# Patient Record
Sex: Male | Born: 1960
Health system: Southern US, Community
[De-identification: ages and names within clinical notes are randomized; demographics above are authoritative.]

## PROBLEM LIST (undated history)

## (undated) DIAGNOSIS — M1990 Unspecified osteoarthritis, unspecified site: Secondary | ICD-10-CM

## (undated) DIAGNOSIS — D649 Anemia, unspecified: Secondary | ICD-10-CM

## (undated) DIAGNOSIS — N200 Calculus of kidney: Secondary | ICD-10-CM

## (undated) DIAGNOSIS — I639 Cerebral infarction, unspecified: Secondary | ICD-10-CM

## (undated) DIAGNOSIS — E785 Hyperlipidemia, unspecified: Secondary | ICD-10-CM

## (undated) DIAGNOSIS — E119 Type 2 diabetes mellitus without complications: Secondary | ICD-10-CM

## (undated) DIAGNOSIS — I10 Essential (primary) hypertension: Secondary | ICD-10-CM

## (undated) HISTORY — PX: ANKLE SURGERY: SHX546

## (undated) HISTORY — PX: KIDNEY STONE SURGERY: SHX686

## (undated) HISTORY — DX: Type 2 diabetes mellitus without complications: E11.9

## (undated) HISTORY — PX: HERNIA REPAIR: SHX51

## (undated) HISTORY — DX: Hyperlipidemia, unspecified: E78.5

---

## 2001-09-06 ENCOUNTER — Ambulatory Visit (HOSPITAL_COMMUNITY): Admission: RE | Admit: 2001-09-06 | Discharge: 2001-09-06 | Payer: Self-pay | Admitting: Pulmonary Disease

## 2002-03-13 ENCOUNTER — Ambulatory Visit (HOSPITAL_COMMUNITY): Admission: RE | Admit: 2002-03-13 | Discharge: 2002-03-13 | Payer: Self-pay | Admitting: General Surgery

## 2002-03-17 ENCOUNTER — Ambulatory Visit (HOSPITAL_COMMUNITY): Admission: RE | Admit: 2002-03-17 | Discharge: 2002-03-17 | Payer: Self-pay | Admitting: General Surgery

## 2003-04-27 ENCOUNTER — Ambulatory Visit (HOSPITAL_COMMUNITY): Admission: RE | Admit: 2003-04-27 | Discharge: 2003-04-27 | Payer: Self-pay | Admitting: General Surgery

## 2004-02-11 ENCOUNTER — Ambulatory Visit (HOSPITAL_COMMUNITY): Admission: RE | Admit: 2004-02-11 | Discharge: 2004-02-11 | Payer: Self-pay | Admitting: Family Medicine

## 2004-10-10 ENCOUNTER — Emergency Department (HOSPITAL_COMMUNITY): Admission: EM | Admit: 2004-10-10 | Discharge: 2004-10-10 | Payer: Self-pay | Admitting: Emergency Medicine

## 2005-04-13 ENCOUNTER — Ambulatory Visit (HOSPITAL_COMMUNITY): Admission: RE | Admit: 2005-04-13 | Discharge: 2005-04-13 | Payer: Self-pay | Admitting: Pulmonary Disease

## 2009-04-03 ENCOUNTER — Encounter (HOSPITAL_COMMUNITY): Admission: RE | Admit: 2009-04-03 | Discharge: 2009-05-03 | Payer: Self-pay | Admitting: Family Medicine

## 2009-04-03 ENCOUNTER — Encounter: Admission: RE | Admit: 2009-04-03 | Discharge: 2009-04-03 | Payer: Self-pay | Admitting: Family Medicine

## 2009-04-04 ENCOUNTER — Encounter: Admission: RE | Admit: 2009-04-04 | Discharge: 2009-04-04 | Payer: Self-pay | Admitting: Family Medicine

## 2010-05-23 NOTE — H&P (Signed)
NAME:  DELRICO, MINEHART NO.:  [id number]   MEDICAL RECORD NO.:  [id number]                  PATIENT TYPE:   LOCATION:                                       FACILITY:   PHYSICIAN:  Dalia Heading, M.D.               DATE OF BIRTH:   DATE OF ADMISSION:  DATE OF DISCHARGE:                                HISTORY & PHYSICAL   CHIEF COMPLAINT:  Umbilical hernia.   HISTORY OF PRESENT ILLNESS:  The patient is a 50 year old white male who is  referred for evaluation and treatment of an umbilical hernia.  He underwent  ventral herniorrhaphy in 2004.  This was superior to the umbilicus.  He now  presents with a week-long history of umbilical swelling.  It is made worse  with straining.  No nausea or vomiting had been noted.   PAST MEDICAL HISTORY:  Unremarkable.   PAST SURGICAL HISTORY:  As noted above.  Ankle surgery.   CURRENT MEDICATIONS:  None.   ALLERGIES:  PENICILLIN.   REVIEW OF SYSTEMS:  Unremarkable.  The patient denies any cardiopulmonary  difficulties or bleeding disorders.   PHYSICAL EXAMINATION:  GENERAL:  The patient is a moderately-obese white  male, in no acute distress.  VITAL SIGNS:  He is afebrile.  Vital signs are stable.  LUNGS:  Clear to auscultation with equal breath sounds bilaterally.  HEART:  Examination reveals a regular rate and rhythm without S3, S4 or  murmurs.  ABDOMEN: Soft, nontender, nondistended.  No hepatosplenomegaly or masses are  noted.  Reducible umbilical hernia is present.  The previous supraumbilical  herniorrhaphy is intact.   IMPRESSION:  Umbilical hernia.   PLAN:  The patient is scheduled for an umbilical herniorrhaphy on April 27, 2003.  The risks and benefits of the procedure, including bleeding,  infection, the possibility of recurrence of the hernia are fully explained  to the patient who gave informed consent.     ___________________________________________    Dalia Heading, M.D.   MAJ/MEDQ  D:  03/27/2003  T:  03/27/2003  Job:  [id number]   cc:   Ramon Dredge L. Juanetta Gosling, M.D.  [address]  [place]  [place] [address]  Fax: [phone]

## 2010-05-23 NOTE — Group Therapy Note (Signed)
NAMESELIG, Troy Randall               ACCOUNT NO.:  [id number]   MEDICAL RECORD NO.:  [id number]          PATIENT TYPE:  EMS   LOCATION:  ED                            FACILITY:  [facility]   PHYSICIAN:  Kingsley Callander. Ouida Sills, MD       DATE OF BIRTH:  [birth year]   DATE OF PROCEDURE:  10/10/2004  DATE OF DISCHARGE:                                   PROGRESS NOTE   CHIEF COMPLAINT:  Chest pain.   HISTORY OF PRESENT ILLNESS:  This patient is a 50 year old white male who  presented to the emergency room after experiencing recurrent bouts of chest  pain this morning.  He had been driving a truck at the time and felt  substernal pain intermittently 7-8 times lasting approximately 30 seconds  each time.  He also felt pressure out in his lateral chest areas.  He went  to a paramedic base and was found to be mildly hypertensive.  He was  encouraged to have additional evaluation and presented to the emergency room  where EKG was normal and point of care markers were negative.  It turns out  he has previously had a negative nuclear stress test performed by Dr.  Alanda Amass earlier this year.  He does not have diabetes or a known history  of hyperlipidemia.  His father had a stroke, but no family members have had  heart disease.   PAST MEDICAL HISTORY:  1.  Two umbilical hernia repairs.  2.  Right ankle surgery.  3.  Three kidney stones with basket extraction.   MEDICATIONS:  None.   ALLERGIES:  PENICILLIN.   SOCIAL HISTORY:  He does not smoke.   REVIEW OF SYSTEMS:  Negative.   PHYSICAL EXAMINATION:  VITAL SIGNS:  Pulse 80, blood pressure 134/80, oxygen  saturation 99%, temperature 97.1, respirations 22.  GENERAL APPEARANCE:  Alert, comfortable, obese white male.  HEENT:  Unremarkable.  LUNGS:  Clear.  HEART:  Regular with no murmurs or gallops.  CHEST:  Nontender.  ABDOMEN:  Nontender.  No hepatosplenomegaly.  EXTREMITIES:  No calf tenderness.  Normal pedal pulses.  No edema, cyanosis  or  clubbing.   STUDIES:  Chest x-ray reveals borderline cardiomegaly but no acute  infiltrate.   LABORATORY DATA:  White count 6.8, hemoglobin 13.8, platelets 236,000.  Sodium 138, potassium 3.9, bicarbonate 27, glucose 93, creatinine 0.8,  calcium 9.0, SGOT 21, troponin I less than 0.05.  EKG reveals normal sinus  rhythm with early transition.   IMPRESSION:  Chest pain.  Observation in a monitored setting was advised.  The patient declined and states he is going home.  He was therefore  encouraged to follow up with Dr. Juanetta Gosling on Monday.  If he has recurrent  pain, he will return promptly.  He knows of the potential risk of not  following recommendations could be myocardial infarction or death.      Kingsley Callander. Ouida Sills, MD  Electronically Signed     ROF/MEDQ  D:  10/10/2004  T:  10/10/2004  Job:  [id number]   cc:   Ramon Dredge L. Juanetta Gosling, M.D.  Fax: [phone]

## 2010-05-23 NOTE — Op Note (Signed)
   NAME:  Troy Randall, Troy Randall                         ACCOUNT NO.:  [id number]   MEDICAL RECORD NO.:  [id number]                   PATIENT TYPE:  AMB   LOCATION:  DAY                                  FACILITY:  [facility]   PHYSICIAN:  Dalia Heading, M.D.               DATE OF BIRTH:  1960-01-11   DATE OF PROCEDURE:  03/17/2002  DATE OF DISCHARGE:                                 OPERATIVE REPORT   PREOPERATIVE DIAGNOSIS:  Ventral hernia.   POSTOPERATIVE DIAGNOSIS:  Ventral hernia.   PROCEDURE:  Ventral herniorrhaphy with mesh.   SURGEON:  Dalia Heading, M.D.   ANESTHESIA:  General endotracheal.   INDICATIONS FOR PROCEDURE:  The patient is a 50 year old moderately obese  white male who presents with a supraumbilical ventral hernia.  The risks and  benefits of the procedure, including bleeding, infection, and recurrence of  the hernia were fully explained to the patient who gave informed consent.   DESCRIPTION OF PROCEDURE:  The patient was placed in the supine position.  After induction of general endotracheal anesthesia, the abdomen was prepped  and draped using the usual sterile technique with Betadine.  Surgical site  confirmation was performed.   A supraumbilical midline incision was taken down to the fascia.  The hernia  defect was noted to be about 3 cm in its greatest diameter.  The hernia sac  was freed away from the surrounding fascia, and the contents were reduced.  The fascia was then closed transversely using 0 Surgidac interrupted  sutures.  An onlay mesh patch was then placed over the repair and secured to  the fascia using 2-0 Novofil interrupted sutures.  The subcutaneous layer  was reapproximated using a 2-0 Vicryl interrupted suture.  The subcutaneous  layer was reapproximated also with 3-0 Vicryl interrupted suture.  The skin  was closed using staples.  Sensorcaine 0.5% was instilled into the  surrounding wound.  Staples and Betadine ointment were then applied.   All tape and needle counts were correct at the end of the procedure.  The  patient was extubated in the operating room and went back to the recovery  room awake and in stable condition.   COMPLICATIONS:  None.    SPECIMENS:  None.   ESTIMATED BLOOD LOSS:  Minimal.                                               Dalia Heading, M.D.    MAJ/MEDQ  D:  03/17/2002  T:  03/17/2002  Job:  [id number]   cc:   Ramon Dredge L. Juanetta Gosling, M.D.  [address]  [place]  [place] [address]  Fax: [phone]

## 2010-05-23 NOTE — H&P (Signed)
   Troy Randall, Troy Randall                           ACCOUNT NO.:  [id number]   MEDICAL RECORD NO.:  [id number]                   PATIENT TYPE:  AMB   LOCATION:                                       FACILITY:  [facility]   PHYSICIAN:  Dalia Heading, M.D.               DATE OF BIRTH:  04-01-1960   DATE OF ADMISSION:  03/13/2002  DATE OF DISCHARGE:                                HISTORY & PHYSICAL   CHIEF COMPLAINT:  Ventral hernia.   HISTORY OF PRESENT ILLNESS:  The patient is a 50 year old white male who was  referred for evaluation and treatment of abdominal pain and swelling.  It  started last week.  It has worsened with time.  The swelling occurs just  above the umbilicus.  It is made worse with straining.  No nausea or  vomiting have been noted.   PAST MEDICAL HISTORY:  Unremarkable.   PAST SURGICAL HISTORY:  Ankle surgery.   CURRENT MEDICATIONS:  None.   ALLERGIES:  PENICILLIN.   REVIEW OF SYSTEMS:  Unremarkable.  The patient denies any cardiopulmonary  difficulties or bleeding disorders.   PHYSICAL EXAMINATION:  GENERAL:  The patient is a moderately obese white  male in no acute distress.  VITAL SIGNS:  He is afebrile and his vital signs are stable.  LUNGS:  Clear to auscultation with equal breath sounds bilaterally.  HEART:  Regular rate and rhythm without S3, S4, or murmurs.  ABDOMEN:  Soft and nondistended.  He has a tender mass which reduces in the  supraumbilical region.  No hepatosplenomegaly is noted.   IMPRESSION:  Ventral hernia.    PLAN:  The patient is scheduled for ventral herniorrhaphy on 03/13/2002.  The  risks and benefits of the procedure, including bleeding, infection, and the  possibility of recurrence of the hernia were fully explained to the patient  who gave informed consent.                                               Dalia Heading, M.D.    MAJ/MEDQ  D:  03/09/2002  T:  03/09/2002  Job:  [id number]   cc:   Ramon Dredge L. Juanetta Gosling, M.D.  [address]  [place]  [place] [address]  Fax: [phone]

## 2010-05-23 NOTE — Op Note (Signed)
NAME:  Troy Randall, Troy Randall                         ACCOUNT NO.:  [id number]   MEDICAL RECORD NO.:  [id number]                   PATIENT TYPE:  AMB   LOCATION:  DAY                                  FACILITY:  [facility]   PHYSICIAN:  Dalia Heading, M.D.               DATE OF BIRTH:  May 16, 1960   DATE OF PROCEDURE:  04/27/2003  DATE OF DISCHARGE:                                 OPERATIVE REPORT   PREOPERATIVE DIAGNOSIS:  Umbilical hernia.   POSTOPERATIVE DIAGNOSIS:  Umbilical hernia.   PROCEDURE:  Umbilical herniorrhaphy.   SURGEON:  Dalia Heading, M.D.   ANESTHESIA:  General endotracheal.   INDICATIONS:  The patient is a 50 year old, obese, white male who presents  with an umbilical hernia.  The risks and benefits of the procedure including  bleeding, infection, and recurrence of the hernia were fully explained to  the patient, who gave informed consent.   DESCRIPTION OF PROCEDURE:  The patient was placed in the supine position.  After induction of general endotracheal anesthesia, the abdomen was prepped  and draped using the usual sterile technique with Betadine.  Surgical site  confirmation was performed.   An infraumbilical incision was made down to the fascia.  The umbilicus was  freed away from the underlying fascia.  The base of the umbilicus was noted  to be wide based.  A small 0.5 cm fascial defect was noted.  The hernia sac  was excised.  The defect was then closed transversely using #0 Ethibond  interrupted sutures.  The base of the umbilicus was then secured back to the  fascia using a 2-0 Vicryl interrupted suture.   The subcutaneous layer was closed using a 3-0 Vicryl interrupted suture.  The skin was closed using staples.  Sensorcaine 0.05% was instilled into the  surrounding wound.  Betadine ointment and dry sterile dressing were applied.   All tape and needle counts were correct at the end of the procedure.  The  patient was extubated in the operating room and went  back to recovery room  awake, in stable condition.   COMPLICATIONS:  None.   SPECIMENS:  None.   BLOOD LOSS:  Minimal.      ___________________________________________                                            Dalia Heading, M.D.   MAJ/MEDQ  D:  04/27/2003  T:  04/28/2003  Job:  [id number]   cc:   Dalia Heading, M.D.  [address] [place]  [place] [address]  Fax: [phone]   Oneal Deputy. Juanetta Gosling, M.D.  [address]  [place]  [place] [address]  Fax: [phone]

## 2011-03-24 NOTE — H&P (Signed)
  NTS SOAP Note  Vital Signs:  Vitals as of: 03/24/2011: Systolic 146: Diastolic 97: Heart Rate 92: Temp 98.50F: Height 75ft 0in: Weight 302Lbs 0 Ounces: OFC 0in: Respiratory Rate 0: O2 Saturation 0: Pain Level 0: BMI 41  BMI : 40.96 kg/m2  Subjective: This 51 Years 67 Months old Male presents for of  HERNIA: ,States that he has had increased swelling and pain beneath his superumbilical hernia scar over the past month.  Made worse with straining.  No nausea, vomiting.  Review of Symptoms:  Constitutional:unremarkable Head:unremarkable Eyes:pain bilateral Nose/Mouth/Throat:unremarkable Cardiovascular:unremarkable Respiratory:unremarkable Gastrointestinabdominal pain Genitourinary:unremarkable joint and back pain Skin:unremarkable Hematolgic/Lymphatic:unremarkable Allergic/Immunologic:unremarkable   Past Medical History:Reviewed   Past Medical History  Surgical History: umbilical herniorrhaphy x 2, last one 9 years ago Medical Problems:  High Blood pressure Allergies: pcn Medications: a bp pill   Social History:Reviewed   Social History  Preferred Language: English ([place]) Race:  White Ethnicity: Not Hispanic / Latino Age: 51 Years 6 Months Marital Status:  M Alcohol: unknown Recreational drug(s):  No   Smoking Status: Never smoker reviewed on 03/24/2011  Family History:Reviewed   Family History  Is there a family history WG:[id number]    Objective Information: Obese wm, in NAD Head:Atraumatic; no masses; no abnormalities Heart:RRR, no murmur or gallop.  Normal S1, S2.  No S3, S4.  Lungs:CTA bilaterally, no wheezes, rhonchi, rales.  Breathing unlabored. Abdomen:Soft, NT/ND, normal bowel sounds, no HSM, no masses.  No peritoneal signs.Reducible supraumbilical hernia present, >5cm in size.  Made worse with straining.  Assessment:Recurrent incisional hernia  Diagnosis &amp; Procedure:  DiagnosisCode: 553.21, ProcedureCode: 46962,    Plan:Scheduled for laparoscopic recurrent incisional herniorrhaphy with mesh on 04/13/11.   Patient Education:Alternative treatments to surgery were discussed with patient (and family).Risks and benefits  of procedure were fully explained to the patient (and family) who gave informed consent. Patient/family questions were addressed.  Follow-up:Pending Surgery

## 2011-03-30 ENCOUNTER — Encounter (HOSPITAL_COMMUNITY): Payer: Self-pay | Admitting: Pharmacy Technician

## 2011-04-06 NOTE — Patient Instructions (Addendum)
20 DERRIOUS BOLOGNA  04/06/2011   Your procedure is scheduled on:  04/13/2011  Report to [facility] at  615  AM.  Call this number if you have problems the morning of surgery: [phone]   Remember:   Do not eat food:After Midnight.  May have clear liquids:until Midnight .  Clear liquids include soda, tea, black coffee, apple or grape juice, broth.  Take these medicines the morning of surgery with A SIP OF WATER: lotrel   Do not wear jewelry, make-up or nail polish.  Do not wear lotions, powders, or perfumes. You may wear deodorant.  Do not shave 48 hours prior to surgery.  Do not bring valuables to the hospital.  Contacts, dentures or bridgework may not be worn into surgery.  Leave suitcase in the car. After surgery it may be brought to your room.  For patients admitted to the hospital, checkout time is 11:00 AM the day of discharge.   Patients discharged the day of surgery will not be allowed to drive home.  Name and phone number of your driver: family  Special Instructions: CHG Shower Use Special Wash: 1/2 bottle night before surgery and 1/2 bottle morning of surgery.   Please read over the following fact sheets that you were given: Pain Booklet, MRSA Information, Surgical Site Infection Prevention, Anesthesia Post-op Instructions and Care and Recovery After Surgery Hernia, Surgical Repair A hernia occurs when an internal organ pushes out through a weak spot in the belly (abdominal) wall muscles. Hernias commonly occur in the groin and around the navel. Hernias often can be pushed back into place (reduced). Most hernias tend to get worse over time. Problems occur when abdominal contents get stuck in the opening (incarcerated hernia). The blood supply gets cut off (strangulated hernia). This is an emergency and needs surgery. Otherwise, hernia repair can be an elective procedure. This means you can schedule this at your convenience when an emergency is not present. Because complications can  occur, if you decide to repair the hernia, it is best to do it soon. When it becomes an emergency procedure, there is increased risk of complications after surgery. CAUSES   Heavy lifting.   Obesity.   Prolonged coughing.   Straining to move your bowels.   Hernias can also occur through a cut (incision) by a surgeonafter an abdominal operation.  HOME CARE INSTRUCTIONS Before the repair:  Bed rest is not required. You may continue your normal activities, but avoid heavy lifting (more than 10 pounds) or straining. Cough gently. If you are a smoker, it is best to stop. Even the best hernia repair can break down with the continual strain of coughing.   Do not wear anything tight over your hernia. Do not try to keep it in with an outside bandage or truss. These can damage abdominal contents if they are trapped in the hernia sac.   Eat a normal diet. Avoid constipation. Straining over long periods of time to have a bowel movement will increase hernia size. It also can breakdown repairs. If you cannot do this with diet alone, laxatives or stool softeners may be used.  PRIOR TO SURGERY, SEEK IMMEDIATE MEDICAL CARE IF: You have problems (symptoms) of a trapped (incarcerated) hernia. Symptoms include:  An oral temperature above 102 F (38.9 C) develops, or as your caregiver suggests.   Increasing abdominal pain.   Feeling sick to your stomach(nausea) and vomiting.   You stop passing gas or stool.   The hernia is stuck  outside the abdomen, looks discolored, feels hard, or is tender.   You have any changes in your bowel habits or in the hernia that is unusual for you.  LET YOUR CAREGIVERS KNOW ABOUT THE FOLLOWING:  Allergies.   Medications taken including herbs, eye drops, over the counter medications, and creams.   Use of steroids (by mouth or creams).   Family or personal history of problems with anesthetics or Novocaine.   Possibility of pregnancy, if this applies.   Personal  history of blood clots (thrombophlebitis).   Family or personal history of bleeding or blood problems.   Previous surgery.   Other health problems.  BEFORE THE PROCEDURE You should be present 1 hour prior to your procedure, or as directed by your caregiver.  AFTER THE PROCEDURE After surgery, you will be taken to the recovery area. A nurse will watch and check your progress there. Once you are awake, stable, and taking fluids well, you will be allowed to go home as long as there are no problems. Once home, an ice pack (wrapped in a light towel) applied to your operative site may help with discomfort. It may also keep the swelling down. Do not lift anything heavier than 10 pounds (4.55 kilograms). Take showers not baths. Do not drive while taking narcotics. Follow instructions as suggested by your caregiver.  SEEK IMMEDIATE MEDICAL CARE IF: After surgery:  There is redness, swelling, or increasing pain in the wound.   There is pus coming from the wound.   There is drainage from a wound lasting longer than 1 day.   An unexplained oral temperature above 102 F (38.9 C) develops.   You notice a foul smell coming from the wound or dressing.   There is a breaking open of a wound (edged not staying together) after the sutures have been removed.   You notice increasing pain in the shoulders (shoulder strap areas).   You develop dizzy episodes or fainting while standing.   You develop persistent nausea or vomiting.   You develop a rash.   You have difficulty breathing.   You develop any reaction or side effects to medications given.  MAKE SURE YOU:   Understand these instructions.   Will watch your condition.   Will get help right away if you are not doing well or get worse.  Document Released: 06/17/2000 Document Revised: 12/11/2010 Document Reviewed: 05/10/2007 [facility] Patient Information 2012 [place], [place].PATIENT INSTRUCTIONS POST-ANESTHESIA  IMMEDIATELY FOLLOWING  SURGERY:  Do not drive or operate machinery for the first twenty four hours after surgery.  Do not make any important decisions for twenty four hours after surgery or while taking narcotic pain medications or sedatives.  If you develop intractable nausea and vomiting or a severe headache please notify your doctor immediately.  FOLLOW-UP:  Please make an appointment with your surgeon as instructed. You do not need to follow up with anesthesia unless specifically instructed to do so.  WOUND CARE INSTRUCTIONS (if applicable):  Keep a dry clean dressing on the anesthesia/puncture wound site if there is drainage.  Once the wound has quit draining you may leave it open to air.  Generally you should leave the bandage intact for twenty four hours unless there is drainage.  If the epidural site drains for more than 36-48 hours please call the anesthesia department.  QUESTIONS?:  Please feel free to call your physician or the hospital operator if you have any questions, and they will be happy to assist you.  [facility] Anesthesia Department [address] [place] [phone]

## 2011-04-07 ENCOUNTER — Other Ambulatory Visit: Payer: Self-pay

## 2011-04-07 ENCOUNTER — Encounter (HOSPITAL_COMMUNITY)
Admission: RE | Admit: 2011-04-07 | Discharge: 2011-04-07 | Disposition: A | Discharge status: Home / Self Care | Payer: BC Managed Care – PPO | Source: Ambulatory Visit | Attending: General Surgery | Admitting: General Surgery

## 2011-04-07 ENCOUNTER — Encounter (HOSPITAL_COMMUNITY): Payer: Self-pay

## 2011-04-07 HISTORY — DX: Essential (primary) hypertension: I10

## 2011-04-07 HISTORY — DX: Unspecified osteoarthritis, unspecified site: M19.90

## 2011-04-07 LAB — CBC
HCT: 42.4 % (ref 39.0–52.0)
Hemoglobin: 14.9 g/dL (ref 13.0–17.0)
MCH: 29.2 pg (ref 26.0–34.0)
MCV: 83.1 fL (ref 78.0–100.0)
Platelets: 173 10*3/uL (ref 150–400)
RBC: 5.1 MIL/uL (ref 4.22–5.81)
RDW: 12.5 % (ref 11.5–15.5)
WBC: 5.6 10*3/uL (ref 4.0–10.5)

## 2011-04-07 LAB — DIFFERENTIAL
Basophils Absolute: 0 10*3/uL (ref 0.0–0.1)
Eosinophils Absolute: 0.2 10*3/uL (ref 0.0–0.7)
Eosinophils Relative: 4 % (ref 0–5)
Lymphocytes Relative: 22 % (ref 12–46)
Lymphs Abs: 1.2 10*3/uL (ref 0.7–4.0)
Monocytes Absolute: 0.3 10*3/uL (ref 0.1–1.0)
Monocytes Relative: 6 % (ref 3–12)
Neutrophils Relative %: 68 % (ref 43–77)

## 2011-04-07 LAB — BASIC METABOLIC PANEL
CO2: 27 mEq/L (ref 19–32)
Chloride: 97 mEq/L (ref 96–112)
Creatinine, Ser: 0.6 mg/dL (ref 0.50–1.35)
GFR calc Af Amer: >90 mL/min (ref >90)
GFR calc non Af Amer: >90 mL/min (ref >90)
Glucose, Bld: 250 mg/dL — ABNORMAL HIGH (ref 70–99)
Potassium: 4.5 mEq/L (ref 3.5–5.1)

## 2011-04-07 LAB — SURGICAL PCR SCREEN
MRSA, PCR: NEGATIVE
Staphylococcus aureus: NEGATIVE

## 2011-04-07 NOTE — Progress Notes (Signed)
04/07/11 0917  OBSTRUCTIVE SLEEP APNEA  Have you ever been diagnosed with sleep apnea through a sleep study? No  Do you snore loudly (loud enough to be heard through closed doors)?  1  Do you often feel tired, fatigued, or sleepy during the daytime? 0  Has anyone observed you stop breathing during your sleep? 0  Do you have, or are you being treated for high blood pressure? 1  BMI more than 35 kg/m2? 1  Age over 49 years old? 0  Neck circumference greater than 40 cm/18 inches? 0  Gender: 1  Obstructive Sleep Apnea Score 4   Score 4 or greater  Updated health history

## 2011-04-08 NOTE — Pre-Procedure Instructions (Signed)
Glucose 250 shown to Dr Jayme Cloud. Will do accucheck on arrival.

## 2011-04-13 ENCOUNTER — Encounter (HOSPITAL_COMMUNITY): Payer: Self-pay | Admitting: *Deleted

## 2011-04-13 ENCOUNTER — Encounter (HOSPITAL_COMMUNITY): Payer: Self-pay | Admitting: Anesthesiology

## 2011-04-13 ENCOUNTER — Encounter (HOSPITAL_COMMUNITY)
Admission: RE | Disposition: A | Discharge status: Home / Self Care | Payer: Self-pay | Source: Ambulatory Visit | Attending: General Surgery

## 2011-04-13 ENCOUNTER — Ambulatory Visit (HOSPITAL_COMMUNITY): Payer: BC Managed Care – PPO | Admitting: Anesthesiology

## 2011-04-13 ENCOUNTER — Ambulatory Visit (HOSPITAL_COMMUNITY)
Admission: RE | Admit: 2011-04-13 | Discharge: 2011-04-13 | Disposition: A | Discharge status: Home / Self Care | Payer: BC Managed Care – PPO | Source: Ambulatory Visit | Attending: General Surgery | Admitting: General Surgery

## 2011-04-13 DIAGNOSIS — Z79899 Other long term (current) drug therapy: Secondary | ICD-10-CM | POA: Insufficient documentation

## 2011-04-13 DIAGNOSIS — I10 Essential (primary) hypertension: Secondary | ICD-10-CM | POA: Insufficient documentation

## 2011-04-13 DIAGNOSIS — Z01810 Encounter for preprocedural cardiovascular examination: Secondary | ICD-10-CM | POA: Insufficient documentation

## 2011-04-13 DIAGNOSIS — E119 Type 2 diabetes mellitus without complications: Secondary | ICD-10-CM | POA: Insufficient documentation

## 2011-04-13 DIAGNOSIS — Z01812 Encounter for preprocedural laboratory examination: Secondary | ICD-10-CM | POA: Insufficient documentation

## 2011-04-13 DIAGNOSIS — K432 Incisional hernia without obstruction or gangrene: Secondary | ICD-10-CM | POA: Insufficient documentation

## 2011-04-13 HISTORY — PX: INCISIONAL HERNIA REPAIR: SHX193

## 2011-04-13 LAB — GLUCOSE, CAPILLARY: Glucose-Capillary: 229 mg/dL — ABNORMAL HIGH (ref 70–99)

## 2011-04-13 SURGERY — REPAIR, HERNIA, INCISIONAL, LAPAROSCOPIC
Anesthesia: General | Site: Abdomen | Wound class: Clean

## 2011-04-13 MED ORDER — ONDANSETRON HCL 4 MG/2ML IJ SOLN: 4.0000 mg | Freq: Once | INTRAMUSCULAR | Status: DC | PRN

## 2011-04-13 MED ORDER — EPHEDRINE SULFATE 50 MG/ML IJ SOLN
INTRAMUSCULAR | Status: DC | PRN
  Administered 2011-04-13: 10 mg via INTRAVENOUS

## 2011-04-13 MED ORDER — ROCURONIUM BROMIDE 100 MG/10ML IV SOLN
INTRAVENOUS | Status: DC | PRN
  Administered 2011-04-13: 40 mg via INTRAVENOUS
  Administered 2011-04-13: 20 mg via INTRAVENOUS

## 2011-04-13 MED ORDER — PROPOFOL 10 MG/ML IV EMUL
INTRAVENOUS | Status: AC
  Filled 2011-04-13: qty 20

## 2011-04-13 MED ORDER — KETOROLAC TROMETHAMINE 30 MG/ML IJ SOLN
INTRAMUSCULAR | Status: AC
  Administered 2011-04-13: 30 mg via INTRAVENOUS
  Filled 2011-04-13: qty 1

## 2011-04-13 MED ORDER — LACTATED RINGERS IV SOLN
INTRAVENOUS | Status: DC
  Administered 2011-04-13: 09:00:00 via INTRAVENOUS
  Administered 2011-04-13: 1000 mL via INTRAVENOUS

## 2011-04-13 MED ORDER — 0.9 % SODIUM CHLORIDE (POUR BTL) OPTIME
TOPICAL | Status: DC | PRN
  Administered 2011-04-13: 1000 mL

## 2011-04-13 MED ORDER — BUPIVACAINE HCL (PF) 0.5 % IJ SOLN
INTRAMUSCULAR | Status: DC | PRN
  Administered 2011-04-13: 10 mL

## 2011-04-13 MED ORDER — FENTANYL CITRATE 0.05 MG/ML IJ SOLN
INTRAMUSCULAR | Status: AC
  Administered 2011-04-13: 50 ug via INTRAVENOUS
  Filled 2011-04-13: qty 2

## 2011-04-13 MED ORDER — GLYCOPYRROLATE 0.2 MG/ML IJ SOLN
INTRAMUSCULAR | Status: AC
  Filled 2011-04-13: qty 1

## 2011-04-13 MED ORDER — ROCURONIUM BROMIDE 50 MG/5ML IV SOLN
INTRAVENOUS | Status: AC
  Filled 2011-04-13: qty 1

## 2011-04-13 MED ORDER — FENTANYL CITRATE 0.05 MG/ML IJ SOLN
25.0000 ug | INTRAMUSCULAR | Status: DC | PRN
  Administered 2011-04-13: 50 ug via INTRAVENOUS
  Administered 2011-04-13: 25 ug via INTRAVENOUS

## 2011-04-13 MED ORDER — LIDOCAINE HCL 1 % IJ SOLN
INTRAMUSCULAR | Status: DC | PRN
  Administered 2011-04-13: 50 mg via INTRADERMAL

## 2011-04-13 MED ORDER — FENTANYL CITRATE 0.05 MG/ML IJ SOLN
INTRAMUSCULAR | Status: AC
  Administered 2011-04-13: 25 ug via INTRAVENOUS
  Filled 2011-04-13: qty 5

## 2011-04-13 MED ORDER — SUCCINYLCHOLINE CHLORIDE 20 MG/ML IJ SOLN
INTRAMUSCULAR | Status: DC | PRN
  Administered 2011-04-13: 160 mg via INTRAVENOUS

## 2011-04-13 MED ORDER — ENOXAPARIN SODIUM 40 MG/0.4ML ~~LOC~~ SOLN
SUBCUTANEOUS | Status: AC
  Administered 2011-04-13: 40 mg via SUBCUTANEOUS
  Filled 2011-04-13: qty 0.4

## 2011-04-13 MED ORDER — MIDAZOLAM HCL 2 MG/2ML IJ SOLN
INTRAMUSCULAR | Status: AC
  Filled 2011-04-13: qty 2

## 2011-04-13 MED ORDER — FENTANYL CITRATE 0.05 MG/ML IJ SOLN
INTRAMUSCULAR | Status: AC
  Filled 2011-04-13: qty 5

## 2011-04-13 MED ORDER — ENOXAPARIN SODIUM 40 MG/0.4ML ~~LOC~~ SOLN
40.0000 mg | Freq: Once | SUBCUTANEOUS | Status: AC
  Administered 2011-04-13: 40 mg via SUBCUTANEOUS

## 2011-04-13 MED ORDER — GLYCOPYRROLATE 0.2 MG/ML IJ SOLN
INTRAMUSCULAR | Status: DC | PRN
  Administered 2011-04-13: .6 mg via INTRAVENOUS

## 2011-04-13 MED ORDER — VANCOMYCIN HCL 1000 MG IV SOLR
1500.0000 mg | INTRAVENOUS | Status: AC
  Administered 2011-04-13: 1500 mg via INTRAVENOUS
  Filled 2011-04-13: qty 1500

## 2011-04-13 MED ORDER — NEOSTIGMINE METHYLSULFATE 1 MG/ML IJ SOLN
INTRAMUSCULAR | Status: DC | PRN
  Administered 2011-04-13: 3 mg via INTRAVENOUS

## 2011-04-13 MED ORDER — FENTANYL CITRATE 0.05 MG/ML IJ SOLN
INTRAMUSCULAR | Status: DC | PRN
  Administered 2011-04-13 (×6): 50 ug via INTRAVENOUS

## 2011-04-13 MED ORDER — GLYCOPYRROLATE 0.2 MG/ML IJ SOLN
INTRAMUSCULAR | Status: AC
  Filled 2011-04-13: qty 2

## 2011-04-13 MED ORDER — GLYCOPYRROLATE 0.2 MG/ML IJ SOLN
0.2000 mg | Freq: Once | INTRAMUSCULAR | Status: AC
  Administered 2011-04-13: 0.2 mg via INTRAVENOUS

## 2011-04-13 MED ORDER — SUCCINYLCHOLINE CHLORIDE 20 MG/ML IJ SOLN
INTRAMUSCULAR | Status: AC
  Filled 2011-04-13: qty 1

## 2011-04-13 MED ORDER — GLYCOPYRROLATE 0.2 MG/ML IJ SOLN
INTRAMUSCULAR | Status: AC
  Administered 2011-04-13: 0.2 mg via INTRAVENOUS
  Filled 2011-04-13: qty 1

## 2011-04-13 MED ORDER — MIDAZOLAM HCL 2 MG/2ML IJ SOLN
1.0000 mg | INTRAMUSCULAR | Status: DC | PRN
  Administered 2011-04-13 (×2): 2 mg via INTRAVENOUS

## 2011-04-13 MED ORDER — KETOROLAC TROMETHAMINE 30 MG/ML IJ SOLN
30.0000 mg | Freq: Once | INTRAMUSCULAR | Status: AC
  Administered 2011-04-13: 30 mg via INTRAVENOUS

## 2011-04-13 MED ORDER — NEOSTIGMINE METHYLSULFATE 1 MG/ML IJ SOLN
INTRAMUSCULAR | Status: AC
  Filled 2011-04-13: qty 10

## 2011-04-13 MED ORDER — MIDAZOLAM HCL 2 MG/2ML IJ SOLN
INTRAMUSCULAR | Status: AC
  Administered 2011-04-13: 2 mg via INTRAVENOUS
  Filled 2011-04-13: qty 2

## 2011-04-13 MED ORDER — BUPIVACAINE HCL (PF) 0.5 % IJ SOLN
INTRAMUSCULAR | Status: AC
Start: 2011-04-13 — End: 2011-04-13
  Filled 2011-04-13: qty 30

## 2011-04-13 MED ORDER — PROPOFOL 10 MG/ML IV BOLUS
INTRAVENOUS | Status: DC | PRN
  Administered 2011-04-13: 200 mg via INTRAVENOUS

## 2011-04-13 MED ORDER — OXYCODONE-ACETAMINOPHEN 7.5-325 MG PO TABS: 1.0000 | ORAL_TABLET | ORAL | Status: AC | PRN

## 2011-04-13 MED ORDER — VANCOMYCIN HCL IN DEXTROSE 1-5 GM/200ML-% IV SOLN
INTRAVENOUS | Status: AC
  Filled 2011-04-13: qty 400

## 2011-04-13 MED ORDER — LIDOCAINE HCL (PF) 1 % IJ SOLN
INTRAMUSCULAR | Status: AC
  Filled 2011-04-13: qty 5

## 2011-04-13 MED ORDER — EPHEDRINE SULFATE 50 MG/ML IJ SOLN
INTRAMUSCULAR | Status: AC
  Filled 2011-04-13: qty 1

## 2011-04-13 SURGICAL SUPPLY — 51 items
ADH SKN CLS APL DERMABOND .7 (GAUZE/BANDAGES/DRESSINGS)
APL SKNCLS STERI-STRIP NONHPOA (GAUZE/BANDAGES/DRESSINGS) ×1
BAG HAMPER (MISCELLANEOUS) ×2 IMPLANT
BENZOIN TINCTURE PRP APPL 2/3 (GAUZE/BANDAGES/DRESSINGS) ×1 IMPLANT
BINDER ABD UNIV 9 30-45 (GAUZE/BANDAGES/DRESSINGS) IMPLANT
BINDER ABDOMINAL 9 (GAUZE/BANDAGES/DRESSINGS) ×4
CLOTH BEACON ORANGE TIMEOUT ST (SAFETY) ×2 IMPLANT
COVER SURGICAL LIGHT HANDLE (MISCELLANEOUS) ×4 IMPLANT
DECANTER SPIKE VIAL GLASS SM (MISCELLANEOUS) ×2 IMPLANT
DERMABOND ADVANCED (GAUZE/BANDAGES/DRESSINGS)
DERMABOND ADVANCED .7 DNX12 (GAUZE/BANDAGES/DRESSINGS) ×1 IMPLANT
DEVICE TROCAR PUNCTURE CLOSURE (ENDOMECHANICALS) ×2 IMPLANT
DURAPREP 26ML APPLICATOR (WOUND CARE) ×2 IMPLANT
ELECT REM PT RETURN 9FT ADLT (ELECTROSURGICAL) ×2
ELECTRODE REM PT RTRN 9FT ADLT (ELECTROSURGICAL) ×1 IMPLANT
FILTER SMOKE EVAC LAPAROSHD (FILTER) ×2 IMPLANT
GLOVE BIO SURGEON STRL SZ7.5 (GLOVE) ×2 IMPLANT
GLOVE ECLIPSE 6.5 STRL STRAW (GLOVE) ×1 IMPLANT
GLOVE EXAM NITRILE MD LF STRL (GLOVE) ×1 IMPLANT
GLOVE INDICATOR 7.0 STRL GRN (GLOVE) ×3 IMPLANT
GLOVE SS BIOGEL STRL SZ 6.5 (GLOVE) IMPLANT
GLOVE SUPERSENSE BIOGEL SZ 6.5 (GLOVE) ×2
GOWN STRL REIN XL XLG (GOWN DISPOSABLE) ×6 IMPLANT
INST SET LAPROSCOPIC AP (KITS) ×2 IMPLANT
IV NS IRRIG 3000ML ARTHROMATIC (IV SOLUTION) ×1 IMPLANT
KIT ROOM TURNOVER APOR (KITS) ×2 IMPLANT
MANIFOLD NEPTUNE II (INSTRUMENTS) ×2 IMPLANT
MESH PHYSIO OVAL 15X20CM (Mesh General) ×1 IMPLANT
NDL INSUFFLATION 14GA 120MM (NEEDLE) ×1 IMPLANT
NEEDLE INSUFFLATION 14GA 120MM (NEEDLE) ×2 IMPLANT
NS IRRIG 1000ML POUR BTL (IV SOLUTION) ×2 IMPLANT
PACK LAP CHOLE LZT030E (CUSTOM PROCEDURE TRAY) ×2 IMPLANT
PAD ARMBOARD 7.5X6 YLW CONV (MISCELLANEOUS) ×2 IMPLANT
SEALER TISSUE G2 CVD JAW 35 (ENDOMECHANICALS) ×1 IMPLANT
SEALER TISSUE G2 CVD JAW 45CM (ENDOMECHANICALS) ×1
SET BASIN LINEN APH (SET/KITS/TRAYS/PACK) ×2 IMPLANT
SET TUBE IRRIG SUCTION NO TIP (IRRIGATION / IRRIGATOR) IMPLANT
SLEEVE Z-THREAD 5X100MM (TROCAR) IMPLANT
SPONGE GAUZE 2X2 8PLY STRL LF (GAUZE/BANDAGES/DRESSINGS) ×8 IMPLANT
STAPLER VISISTAT (STAPLE) ×2 IMPLANT
STRIP CLOSURE SKIN 1/2X4 (GAUZE/BANDAGES/DRESSINGS) ×1 IMPLANT
SUT PROLENE 0 CT 1 CR/8 (SUTURE) ×2 IMPLANT
SUT VICRYL 0 UR6 27IN ABS (SUTURE) ×1 IMPLANT
TACKER 5MM HERNIA 3.5CML NAB (ENDOMECHANICALS) ×3 IMPLANT
TOWEL OR 17X26 4PK STRL BLUE (TOWEL DISPOSABLE) ×1 IMPLANT
TRAY FOLEY CATH 14FR (SET/KITS/TRAYS/PACK) ×2 IMPLANT
TROCAR Z-THRD FIOS HNDL 11X100 (TROCAR) ×2 IMPLANT
TROCAR Z-THREAD FIOS 5X100MM (TROCAR) ×2 IMPLANT
TROCAR Z-THREAD SLEEVE 11X100 (TROCAR) ×3 IMPLANT
TUBING HI FLO HEAT INSUFFLATOR (IRRIGATION / IRRIGATOR) ×1 IMPLANT
WARMER LAPAROSCOPE (MISCELLANEOUS) ×2 IMPLANT

## 2011-04-13 NOTE — Anesthesia Postprocedure Evaluation (Signed)
  Anesthesia Post-op Note  Patient: Troy Randall  Procedure(s) Performed: Procedure(s) (LRB): LAPAROSCOPIC INCISIONAL HERNIA (N/A)  Patient Location: PACU  Anesthesia Type: General  Level of Consciousness: awake, alert  and oriented  Airway and Oxygen Therapy: Patient Spontanous Breathing and Patient connected to face mask oxygen  Post-op Pain: none  Post-op Assessment: Post-op Vital signs reviewed, Patient's Cardiovascular Status Stable, Respiratory Function Stable, Patent Airway and No signs of Nausea or vomiting  Post-op Vital Signs: Reviewed and stable  Complications: No apparent anesthesia complications

## 2011-04-13 NOTE — Anesthesia Preprocedure Evaluation (Signed)
Anesthesia Evaluation  Patient identified by MRN, date of birth, ID band Patient awake    Reviewed: Allergy & Precautions, H&P , NPO status , Patient's Chart, lab work & pertinent test results  History of Anesthesia Complications Negative for: history of anesthetic complications  Airway Mallampati: III      Dental  (+) Implants and Chipped   Pulmonary  breath sounds clear to auscultation        Cardiovascular hypertension, Pt. on medications Rhythm:Regular Rate:Normal     Neuro/Psych    GI/Hepatic   Endo/Other  Diabetes mellitus-, Poorly Controlled, Type obesity  Renal/GU      Musculoskeletal   Abdominal   Peds  Hematology   Anesthesia Other Findings   Reproductive/Obstetrics                           Anesthesia Physical Anesthesia Plan  ASA: III  Anesthesia Plan: General   Post-op Pain Management:    Induction: Intravenous, Rapid sequence and Cricoid pressure planned  Airway Management Planned: Oral ETT  Additional Equipment:   Intra-op Plan:   Post-operative Plan: Extubation in OR  Informed Consent: I have reviewed the patients History and Physical, chart, labs and discussed the procedure including the risks, benefits and alternatives for the proposed anesthesia with the patient or authorized representative who has indicated his/her understanding and acceptance.     Plan Discussed with:   Anesthesia Plan Comments:         Anesthesia Quick Evaluation

## 2011-04-13 NOTE — Anesthesia Procedure Notes (Signed)
Procedure Name: Intubation Date/Time: 04/13/2011 7:48 AM Performed by: Glynn Octave E Pre-anesthesia Checklist: Patient identified, Patient being monitored, Timeout performed, Emergency Drugs available and Suction available Patient Re-evaluated:Patient Re-evaluated prior to inductionOxygen Delivery Method: Circle System Utilized Preoxygenation: Pre-oxygenation with 100% oxygen Intubation Type: IV induction, Rapid sequence and Cricoid Pressure applied Ventilation: Mask ventilation without difficulty Laryngoscope Size: Mac and 3 Grade View: Grade I Tube type: Oral Tube size: 8.0 mm Number of attempts: 1 Airway Equipment and Method: stylet Placement Confirmation: ETT inserted through vocal cords under direct vision,  positive ETCO2 and breath sounds checked- equal and bilateral Secured at: 24 cm Tube secured with: Tape Dental Injury: Teeth and Oropharynx as per pre-operative assessment

## 2011-04-13 NOTE — Interval H&P Note (Signed)
History and Physical Interval Note:  04/13/2011 7:32 AM  Troy Randall  has presented today for surgery, with the diagnosis of Incisional hernia   The various methods of treatment have been discussed with the patient and family. After consideration of risks, benefits and other options for treatment, the patient has consented to  Procedure(s) (LRB): LAPAROSCOPIC INCISIONAL HERNIA (N/A) as a surgical intervention .  The patients' history has been reviewed, patient examined, no change in status, stable for surgery.  I have reviewed the patients' chart and labs.  Questions were answered to the patient's satisfaction.     Franky Macho A

## 2011-04-13 NOTE — Progress Notes (Signed)
Pt is beginning work up for diabetes with Dr Juanetta Gosling.

## 2011-04-13 NOTE — Transfer of Care (Signed)
Immediate Anesthesia Transfer of Care Note  Patient: Troy Randall  Procedure(s) Performed: Procedure(s) (LRB): LAPAROSCOPIC INCISIONAL HERNIA (N/A)  Patient Location: PACU  Anesthesia Type: General  Level of Consciousness: awake, alert  and oriented  Airway & Oxygen Therapy: Patient Spontanous Breathing and Patient connected to face mask oxygen  Post-op Assessment: Report given to PACU RN  Post vital signs: Reviewed and stable  Complications: No apparent anesthesia complications

## 2011-04-13 NOTE — Op Note (Signed)
Patient:  Troy Randall  DOB:  January 05, 1961  MRN:  [id number]   Preop Diagnosis:  Recurrent incisional hernia  Postop Diagnosis:  Same  Procedure:  Laparoscopic recurrent incisional herniorrhaphy with mesh  Surgeon:  Franky Macho, M.D.  Anes:  General endotracheal  Indications:  Patient is a 51 year old white male who presents with a recurrent incisional hernia. He last had incisional herniorrhaphy 9 years ago. The risks and benefits of the procedure including bleeding, infection, and recurrence of the hernia were fully explained to the patient, gave informed consent.  Procedure note:  Patient is placed the supine position. After induction of general endotracheal anesthesia, the abdomen was prepped and draped using usual sterile technique with DuraPrep. Surgical site confirmation was performed.  A Veress needle was inserted into the epigastric region. Confirmation of placement was done using the saline drop test. The abdomen was then insufflated to 16 mm mercury pressure. An 11 mm trocar was introduced into the abdominal cavity and left upper quadrant under direct visualization without difficulty. An additional 11 mm trocar was placed left lower cord region and right upper quadrant regions under direct visualization. A 5 mm trocar was placed the right lower corner region. Omentum was noted to be present in the hernia sac. This was reduced using the LigaSure without difficulty. A 15 x 20 cm Ethicon Physiomesh  was then inserted into the abdominal cavity. 2-0 Prolene tacking sutures were placed in the 4 quadrants. A protractor was then used to secure the mesh along the anterior abdominal wall. All fluid and air were then evacuated from the abdominal cavity prior to removal of the trochars.  All wounds were gave normal saline. All wounds were checked with 0.5% Sensorcaine. All trocar sites were closed using staples. Steri-Strips were used for the Prolene tack sites. Betadine ointment after  dressings were applied.  All tape and needle counts were correct the end of the procedure. The patient was extubated in the operating room went back to recovery room awake in stable condition.  Complications:  None  EBL:  Minimal  Specimen:  None

## 2011-04-13 NOTE — Discharge Instructions (Signed)
PATIENT INSTRUCTIONS HERNIA  FOLLOW-UP:  Please make an appointment with your physician. Call your physician immediately if you have any fevers greater than 102.5, drainage from you wound that is not clear or looks infected, persistent bleeding, increasing abdominal pain, problems urinating, or persistent nausea/vomiting.    WOUND CARE INSTRUCTIONS:  Keep a dry clean dressing on the wound if there is drainage. The initial bandage may be removed after 24 hours.  Once the wound has quit draining you may leave it open to air.  If clothing rubs against the wound or causes irritation and the wound is not draining you may cover it with a dry dressing during the daytime.  Try to keep the wound dry and avoid ointments on the wound unless directed to do so.  If the wound becomes bright red and painful or starts to drain infected material that is not clear, please contact your physician immediately.  If the wound is mildly pink and has a thick firm ridge underneath it, this is normal, and is referred to as a healing ridge.  This will resolve over the next 4-6 weeks.  DIET:  You may eat any foods that you can tolerate.  It is a good idea to eat a high fiber diet and take in plenty of fluids to prevent constipation.  If you do become constipated you may want to take a mild laxative or take ducolax tablets on a daily basis until your bowel habits are regular.  Constipation can be very uncomfortable, along with straining, after recent abdominal surgery.  ACTIVITY:  You are encouraged to cough and deep breath or use your incentive spirometer if you were given one, every 15-30 minutes when awake.  This will help prevent respiratory complications and low grade fevers post-operatively.  You may want to hug a pillow when coughing and sneezing to add additional support to the surgical area which will decrease pain during these times.  You are encouraged to walk and engage in light activity for the next two weeks.  You should  not lift more than 20 pounds during this time frame as it could put you at increased risk for a hernia recurrence.  Twenty pounds is roughly equivalent to a plastic bag of groceries.    MEDICATIONS:  Try to take narcotic medications and anti-inflammatory medications, such as tylenol, ibuprofen, naprosyn, etc., with food.  This will minimize stomach upset from the medication.  Should you develop nausea and vomiting from the pain medication, or develop a rash, please discontinue the medication and contact your physician.  You should not drive, make important decisions, or operate machinery when taking narcotic pain medication.  QUESTIONS:  Please feel free to call your physician or the hospital operator if you have any questions, and they will be glad to assist you.  Instructions Following General Anesthetic, Adult A nurse specialized in giving anesthesia (anesthetist) or a doctor specialized in giving anesthesia (anesthesiologist) gave you a medicine that made you sleep while a procedure was performed. For as long as 24 hours following this procedure, you may feel:  Dizzy.   Weak.   Drowsy.  AFTER THE PROCEDURE After surgery, you will be taken to the recovery area where a nurse will monitor your progress. You will be allowed to go home when you are awake, stable, taking fluids well, and without complications. For the first 24 hours following an anesthetic:  Have a responsible person with you.   Do not drive a car. If you are alone,  do not take public transportation.   Do not drink alcohol.   Do not take medicine that has not been prescribed by your caregiver.   Do not sign important papers or make important decisions.   You may resume normal diet and activities as directed.   Change bandages (dressings) as directed.   Only take over-the-counter or prescription medicines for pain, discomfort, or fever as directed by your caregiver.  If you have questions or problems that seem related  to the anesthetic, call the hospital and ask for the anesthetist or anesthesiologist on call. SEEK IMMEDIATE MEDICAL CARE IF:   You develop a rash.   You have difficulty breathing.   You have chest pain.   You develop any allergic problems.  Document Released: 03/30/2000 Document Revised: 12/11/2010 Document Reviewed: 11/08/2006 [facility] Patient Information 2012 [place], [place].

## 2011-04-15 ENCOUNTER — Encounter (HOSPITAL_COMMUNITY): Payer: Self-pay | Admitting: General Surgery

## 2012-11-28 ENCOUNTER — Encounter (HOSPITAL_COMMUNITY): Payer: Self-pay | Admitting: Pharmacy Technician

## 2012-11-29 ENCOUNTER — Encounter (HOSPITAL_COMMUNITY): Payer: Self-pay | Admitting: *Deleted

## 2012-11-29 ENCOUNTER — Other Ambulatory Visit (HOSPITAL_COMMUNITY): Payer: Self-pay | Admitting: *Deleted

## 2012-11-29 MED ORDER — CLINDAMYCIN PHOSPHATE 900 MG/50ML IV SOLN
900.0000 mg | INTRAVENOUS | Status: AC
  Filled 2012-11-29: qty 50

## 2012-11-30 ENCOUNTER — Encounter (HOSPITAL_COMMUNITY): Payer: Self-pay | Admitting: Certified Registered Nurse Anesthetist

## 2012-11-30 ENCOUNTER — Ambulatory Visit (HOSPITAL_COMMUNITY)
Admission: RE | Admit: 2012-11-30 | Payer: BC Managed Care – PPO | Source: Ambulatory Visit | Admitting: Orthopedic Surgery

## 2012-11-30 HISTORY — DX: Calculus of kidney: N20.0

## 2012-11-30 HISTORY — DX: Anemia, unspecified: D64.9

## 2012-11-30 SURGERY — EXCISION MASS
Anesthesia: Monitor Anesthesia Care | Site: Thumb | Laterality: Right

## 2013-11-02 ENCOUNTER — Other Ambulatory Visit: Payer: Self-pay | Admitting: Physical Medicine and Rehabilitation

## 2013-11-02 DIAGNOSIS — M545 Low back pain: Secondary | ICD-10-CM

## 2013-11-03 ENCOUNTER — Ambulatory Visit
Admission: RE | Admit: 2013-11-03 | Discharge: 2013-11-03 | Disposition: A | Discharge status: Home / Self Care | Payer: BC Managed Care – PPO | Source: Ambulatory Visit | Attending: Physical Medicine and Rehabilitation | Admitting: Physical Medicine and Rehabilitation

## 2013-11-03 DIAGNOSIS — M545 Low back pain: Secondary | ICD-10-CM

## 2013-11-03 MED ORDER — GADOBENATE DIMEGLUMINE 529 MG/ML IV SOLN
20.0000 mL | Freq: Once | INTRAVENOUS | Status: AC | PRN
  Administered 2013-11-03: 20 mL via INTRAVENOUS

## 2013-11-28 DIAGNOSIS — I10 Essential (primary) hypertension: Secondary | ICD-10-CM | POA: Insufficient documentation

## 2013-11-29 DIAGNOSIS — E785 Hyperlipidemia, unspecified: Secondary | ICD-10-CM | POA: Insufficient documentation

## 2014-09-26 LAB — LIPID PANEL
HDL: 36 mg/dL (ref 35–70)
LDL Cholesterol: 115 mg/dL
Triglycerides: 384 mg/dL — AB (ref 40–160)

## 2014-09-26 LAB — HEMOGLOBIN A1C: Hgb A1c MFr Bld: 8.3 % — AB (ref 4.0–6.0)

## 2014-09-26 LAB — BASIC METABOLIC PANEL: Creatinine: 1.1 mg/dL (ref 0.6–1.3)

## 2014-10-15 ENCOUNTER — Encounter: Payer: Self-pay | Admitting: Internal Medicine

## 2014-11-21 ENCOUNTER — Other Ambulatory Visit: Payer: Self-pay | Admitting: *Deleted

## 2014-11-21 ENCOUNTER — Encounter: Payer: Self-pay | Admitting: Endocrinology

## 2014-11-21 ENCOUNTER — Ambulatory Visit (INDEPENDENT_AMBULATORY_CARE_PROVIDER_SITE_OTHER): Payer: BLUE CROSS/BLUE SHIELD | Admitting: Endocrinology

## 2014-11-21 VITALS — BP 149/119 | HR 91 | Temp 98.4°F | Resp 16 | Ht 71.5 in | Wt 304.6 lb

## 2014-11-21 DIAGNOSIS — N521 Erectile dysfunction due to diseases classified elsewhere: Secondary | ICD-10-CM

## 2014-11-21 DIAGNOSIS — E782 Mixed hyperlipidemia: Secondary | ICD-10-CM

## 2014-11-21 DIAGNOSIS — E1165 Type 2 diabetes mellitus with hyperglycemia: Secondary | ICD-10-CM | POA: Diagnosis not present

## 2014-11-21 DIAGNOSIS — Z23 Encounter for immunization: Secondary | ICD-10-CM | POA: Diagnosis not present

## 2014-11-21 DIAGNOSIS — E1169 Type 2 diabetes mellitus with other specified complication: Secondary | ICD-10-CM | POA: Diagnosis not present

## 2014-11-21 DIAGNOSIS — I10 Essential (primary) hypertension: Secondary | ICD-10-CM | POA: Diagnosis not present

## 2014-11-21 MED ORDER — ONETOUCH DELICA LANCETS 33G MISC: Status: AC

## 2014-11-21 MED ORDER — GLUCOSE BLOOD VI STRP
ORAL_STRIP | Status: DC
Start: 2014-11-21 — End: 2015-06-06

## 2014-11-21 MED ORDER — INSULIN PEN NEEDLE 32G X 5 MM MISC: Status: DC

## 2014-11-21 MED ORDER — CANAGLIFLOZIN 100 MG PO TABS: ORAL_TABLET | ORAL | Status: DC

## 2014-11-21 MED ORDER — VICTOZA 18 MG/3ML ~~LOC~~ SOPN: 1.2000 mg | PEN_INJECTOR | Freq: Every day | SUBCUTANEOUS | Status: DC

## 2014-11-21 NOTE — Progress Notes (Signed)
Troy Randall ID: Troy Randall, male   DOB: 1960/10/12, 54 y.o.   MRN: [id number]           Reason for Appointment: Consultation for Type 2 Diabetes   History of Present Illness:          Date of diagnosis of type 2 diabetes mellitus: 2013       Background history:   Troy Randall thinks Troy Randall was diagnosed to have diabetes in 2015 that Troy Randall had a glucose of 250 in the hospital in 2013. Last year in 11/15 Troy Randall was started on metformin and glipizide when Troy Randall was found to have diabetes.  Did not have any symptoms initially.  Glipizide has been subsequently increased in dosage Troy Randall has never been monitoring Troy Randall blood sugar Troy Randall A1c was as high as 11.2% in 11/15  Recent history:   Troy Randall has now been referred for poor control of Troy Randall diabetes with A1c persistently on 8% this year Troy Randall has been taking Onglyza for the last month or so but is concerned about the cost. Troy Randall has not had any diabetes education Troy Randall does have a meter at home but does not know how to use it.  Troy Randall thinks that Troy Randall is not excessively thirsty or urinating frequently.  Does complain of feeling fatigue  Current blood sugar patterns and problems identified:  Troy Randall is not able to lose weight  Currently not able to exercise because of low back pain  Has minimal knowledge of meal planning     Glucose in the office is 380 after lunch today  Non-insulin hypoglycemic drugs the Troy Randall is taking are: Glipizide Er 10mg  bid, metformin 1 g twice a day      Side effects from medications have been: None  Compliance with the medical regimen: Fair Hypoglycemia:   never  Glucose monitoring:  done 0 times a day         Glucometer: ?   Blood Glucose readings none   Self-care: The diet that the Troy Randall has been following is: tries to limit sweet drinks.     Typical meal intake: Breakfast is biscuit , may skip lunch.  Dinner is meat and vegetables.  Will have popcorn for snacks.                Dietician visit, most recent: never               Exercise:  none  Weight history: 285-315  Wt Readings from Last 3 Encounters:  11/21/14 304 lb 9.6 oz (138.166 kg)  04/07/11 302 lb (136.986 kg)    Glycemic control:    Lab Results  Component Value Date   HGBA1C 8.3* 09/26/2014   Lab Results  Component Value Date   LDLCALC 115 09/26/2014   CREATININE 1.1 09/26/2014         Medication List       This list is accurate as of: 11/21/14  9:01 PM.  Always use your most recent med list.               amLODipine-benazepril 5-20 MG capsule  Commonly known as:  LOTREL  Take 1 capsule by mouth daily.     atorvastatin 10 MG tablet  Commonly known as:  LIPITOR     canagliflozin 100 MG Tabs tablet  Commonly known as:  INVOKANA  1 tablet before breakfast     fenofibrate 160 MG tablet     glipiZIDE 10 MG 24 hr tablet  Commonly known as:  GLUCOTROL XL  glucose blood test strip  Commonly known as:  ONETOUCH VERIO  Use as instructed to check blood sugar once a day dx code E11.65     ibuprofen 200 MG tablet  Commonly known as:  ADVIL,MOTRIN  Take 400-600 mg by mouth every 6 (six) hours as needed for moderate pain.     Insulin Pen Needle 32G X 5 MM Misc  Commonly known as:  NOVOTWIST  Use one daily to inject Victoza.     metFORMIN 1000 MG tablet  Commonly known as:  GLUCOPHAGE     montelukast 10 MG tablet  Commonly known as:  SINGULAIR     ONETOUCH DELICA LANCETS 33G Misc  Use to check blood sugar once a day dx code E11.65     ONGLYZA 5 MG Tabs tablet  Generic drug:  saxagliptin HCl     traMADol 50 MG tablet  Commonly known as:  ULTRAM  Take 50 mg by mouth 2 (two) times daily.     VICTOZA 18 MG/3ML Sopn  Generic drug:  Liraglutide  Inject 0.2 mLs (1.2 mg total) into the skin daily. Inject once daily at the same time     zolpidem 10 MG tablet  Commonly known as:  AMBIEN  take 1 tablet at bedtime if needed DO NOT FILL UNTIL 10-23-2014.        Allergies:  Allergies  Allergen Reactions  . Penicillins Hives     Past Medical History  Diagnosis Date  . Hypertension   . Arthritis   . Kidney stones   . Anemia     Past Surgical History  Procedure Laterality Date  . Hernia repair      umbilical x1 Incisional x1  . Incisional hernia repair  04/13/2011    Procedure: LAPAROSCOPIC INCISIONAL HERNIA;  Surgeon: Dalia Heading, MD;  Location: [facility];  Service: General;  Laterality: N/A;  Recurrent Laparoscopic Incisional Herniorraphy with Mesh  . Kidney stone surgery    . Ankle surgery      Family History  Problem Relation Age of Onset  . Anesthesia problems Neg Hx   . Hypotension Neg Hx   . Malignant hyperthermia Neg Hx   . Pseudochol deficiency Neg Hx   . Diabetes Father     Social History:  reports that Troy Randall has never smoked. Troy Randall uses smokeless tobacco. Troy Randall reports that Troy Randall does not drink alcohol or use illicit drugs.    Review of Systems    Lipid history: On Lipitor and fenofibrate for several months, baseline lipids not available    Lab Results  Component Value Date   HDL 36 09/26/2014   LDLCALC 115 09/26/2014   TRIG 384* 09/26/2014           Constitutional: no recent weight gain/loss, Troy Randall does complain of significant tiredness  Eyes: no history of blurred vision.  Most recent eye exam was in 2015, not clear if complete exam was done   ENT: Has allergies causing nasal congestion, no hoarseness   Cardiovascular: no chest pain or tightness on exertion.  No leg swelling.   Hypertension: On medications for 10+ years, treated by PCP  Respiratory: no cough/shortness of breath  Gastrointestinal: Troy Randall tends to have some loose stools which is chronic, no nausea or abdominal pain  Musculoskeletal: Has significant low back pain related to disc disease, not able to get surgery at this time. Has some right elbow pain.   Urological:   No frequency of urination or  nocturia.  Troy Randall has not  been circumcised Troy Randall has had partial erectile dysfunction for 2 years, currently not desiring  treatment  Skin: no rash or infections  Neurological: perioic headaches.   Has no numbness, burning, pains or tingling in feet was having some tingling previously which is improved   Psychiatric: no symptoms of depression  Endocrine: No  cold intolerance or history of thyroid disease   LABS:  Office Visit on 11/21/2014  Component Date Value Ref Range Status  . Creatinine 09/26/2014 1.1  0.6 - 1.3 mg/dL Final  . Triglycerides 09/26/2014 384* 40 - 160 mg/dL Final  . HDL 29/56/2130 36  35 - 70 mg/dL Final  . LDL Cholesterol 09/26/2014 115   Final  . Hgb A1c MFr Bld 09/26/2014 8.3* 4.0 - 6.0 % Final    Physical Examination:  BP 149/119 mmHg  Pulse 91  Temp(Src) 98.4 F (36.9 C)  Resp 16  Ht 5' 11.5" (1.816 m)  Wt 304 lb 9.6 oz (138.166 kg)  BMI 41.90 kg/m2  SpO2 96%  GENERAL:         Troy Randall has marked generalized obesity.   HEENT:         Eye exam shows normal external appearance. Fundus exam shows no retinopathy. Oral exam shows normal mucosa .  NECK:   There is no lymphadenopathy Thyroid is not enlarged and no nodules felt.  Carotids are normal to palpation and no bruit heard LUNGS:         Chest is symmetrical. Lungs are clear to auscultation.Marland Kitchen   HEART:         Heart sounds:  S1 and S2 are normal. No murmur or click heard., no S3 or S4.   ABDOMEN:   There is no distention present. Liver and spleen are  palpable. No other mass or tenderness present.   NEUROLOGICAL:   Vibration sense is Mildly reduced in distal first toes. Ankle jerks are absent bilaterally.          Diabetic foot exam shows normal monofilament sensation in the toes and plantar surfaces, no skin lesions or ulcers on the feet and normal pedal pulses MUSCULOSKELETAL:  There is no swelling or deformity of the peripheral joints. Spine is normal to inspection.   EXTREMITIES:     There is no edema. No skin lesions present.Marland Kitchen SKIN:       No rash or lesions of concern.        ASSESSMENT:  Diabetes type 2,  uncontrolled with severe obesity  Troy Randall has had significant hyperglycemia at onset and has inadequate control with metformin and glipizide Discussed that with Troy Randall severe obesity and insulin resistance Troy Randall probably also have some degree of insulin deficiency also Troy Randall is not able to exercise and lose weight Troy Randall needs to be given a trial of more effective treatment modalities that would also help Troy Randall lose weight Discussed options available including GLP-1 drugs and SGLT2 drugs    Currently Troy Randall insurance preference is for Victoza and Invokana Discussed with the Troy Randall the nature of GLP-1 drugs, the actions on various organ systems, how they benefit blood glucose control, as well as the benefit of weight loss and  increase satiety . Explained possible side effects especially nausea and vomiting initially; discussed safety information in package insert.  Described the injection technique and dosage titration of Victoza  starting with 0.6 mg once a day at the same time for the first week and then increasing to 1.2 mg if no symptoms of nausea.   Educational brochure on  Victoza and co-pay card given Troy Randall was shown how to do the injection by the nurse educator  Discussed action of SGLT 2 drugs on lowering glucose by decreasing kidney absorption of glucose, benefits of weight loss and lower blood pressure, possible side effects including candidiasis and dosage regimen  Complications: Erectile dysfunction, needs to have urine microalbumin assessed and follow-up eye exam Currently not desiring treatment for erectile dysfunction  HYPERLIPIDEMIA: Troy Randall has had significant hypertriglyceridemia and some increase in LDL also Last triglycerides were high possibly because of poor diabetes control, continued obesity and insulin resistance  HYPERTENSION: Blood pressure appears to be relatively higher today although Troy Randall is also in a significant amount of pain from Troy Randall back  LOW back pain related to lumbar disc disease, has  symptoms not relieved by current regimen   PLAN:    Start Victoza as above  Start Invokana as above with 100 mg daily in the morning  Start glucose monitoring at home  Discussed timing of glucose monitoring and targets.  Troy Randall was shown how to use the One Touch Verio meter by the nurse educator  Troy Randall will reduce Troy Randall glipizide to 1 tablet daily  Troy Randall may need to reduce Troy Randall antihypertensives once Troy Randall has lost weight and started Invokana  Follow-up in 3 weeks  Consultation with dietitian  Improve diet with low-fat choices  Troy Randall Instructions  Check blood sugars on waking up 2-3 times a week Also check blood sugars about 2 hours after a meal and do this after different meals by rotation  Recommended blood sugar levels on waking up is 90-130 and about 2 hours after meal is 130-160  Please bring your blood sugar monitor to each visit, thank you  Start VICTOZA injection with the sample pen once daily at the same time of the day preferably at bedtime.  Dial the dose to 0.6 mg for the first week.  You may  experience nausea in the first few days which usually gets better the After 1 week increase the dose to 1.2mg  daily if no nausea.   You may inject in the stomach, thigh or arm.    You will feel fullness of the stomach with starting the medication and should try to keep portions of food small.    REDUCE glipizide to 1 tablet daily in the morning, continue metformin unchanged  INVOKANA: Take this before breakfast daily  Diet: Low fat, avoid high-fat meats and dairy products/butter/cream/baked foods with high fat content   Counseling time on subjects discussed above is over 50% of today's 60 minute visit   Jaliyah Fotheringham 11/21/2014, 9:01 PM   Note: This office note was prepared with Insurance underwriter. Any transcriptional errors that result from this process are unintentional.

## 2014-11-21 NOTE — Patient Instructions (Addendum)
Check blood sugars on waking up 2-3 times a week Also check blood sugars about 2 hours after a meal and do this after different meals by rotation  Recommended blood sugar levels on waking up is 90-130 and about 2 hours after meal is 130-160  Please bring your blood sugar monitor to each visit, thank you  Start VICTOZA injection with the sample pen once daily at the same time of the day preferably at bedtime.  Dial the dose to 0.6 mg for the first week.  You may  experience nausea in the first few days which usually gets better the After 1 week increase the dose to 1.2mg  daily if no nausea.   You may inject in the stomach, thigh or arm.    You will feel fullness of the stomach with starting the medication and should try to keep portions of food small.    REDUCE glipizide to 1 tablet daily in the morning, continue metformin unchanged  INVOKANA: Take this before breakfast daily  Diet: Low fat, avoid high-fat meats and dairy products/butter/cream/baked foods with high fat content

## 2014-12-12 ENCOUNTER — Ambulatory Visit (INDEPENDENT_AMBULATORY_CARE_PROVIDER_SITE_OTHER): Payer: BLUE CROSS/BLUE SHIELD | Admitting: Endocrinology

## 2014-12-12 ENCOUNTER — Encounter: Payer: Self-pay | Admitting: Endocrinology

## 2014-12-12 VITALS — BP 130/86 | HR 95 | Temp 98.4°F | Resp 14 | Ht 71.5 in | Wt 303.6 lb

## 2014-12-12 DIAGNOSIS — E1165 Type 2 diabetes mellitus with hyperglycemia: Secondary | ICD-10-CM | POA: Diagnosis not present

## 2014-12-12 DIAGNOSIS — I10 Essential (primary) hypertension: Secondary | ICD-10-CM

## 2014-12-12 LAB — MICROALBUMIN / CREATININE URINE RATIO
CREATININE, U: 61.9 mg/dL
MICROALB UR: 1.5 mg/dL (ref 0.0–1.9)
MICROALB/CREAT RATIO: 2.4 mg/g (ref 0.0–30.0)

## 2014-12-12 LAB — COMPREHENSIVE METABOLIC PANEL
ALK PHOS: 38 U/L — AB (ref 39–117)
ALT: 23 U/L (ref 0–53)
AST: 17 U/L (ref 0–37)
Albumin: 4.2 g/dL (ref 3.5–5.2)
BUN: 12 mg/dL (ref 6–23)
CHLORIDE: 106 meq/L (ref 96–112)
CO2: 27 meq/L (ref 19–32)
Calcium: 9.6 mg/dL (ref 8.4–10.5)
Creatinine, Ser: 0.97 mg/dL (ref 0.40–1.50)
GFR: 86.3 mL/min (ref >60.00)
GLUCOSE: 169 mg/dL — AB (ref 70–99)
POTASSIUM: 4 meq/L (ref 3.5–5.1)
SODIUM: 141 meq/L (ref 135–145)
TOTAL PROTEIN: 7.1 g/dL (ref 6.0–8.3)
Total Bilirubin: 0.4 mg/dL (ref 0.2–1.2)

## 2014-12-12 LAB — URINALYSIS, ROUTINE W REFLEX MICROSCOPIC
BILIRUBIN URINE: NEGATIVE
HGB URINE DIPSTICK: NEGATIVE
KETONES UR: NEGATIVE
LEUKOCYTES UA: NEGATIVE
Nitrite: NEGATIVE
Specific Gravity, Urine: 1.01 (ref 1.000–1.030)
TOTAL PROTEIN, URINE-UPE24: NEGATIVE
UROBILINOGEN UA: 0.2 (ref 0.0–1.0)
pH: 6.5 (ref 5.0–8.0)

## 2014-12-12 MED ORDER — CANAGLIFLOZIN 300 MG PO TABS: 300.0000 mg | ORAL_TABLET | Freq: Every day | ORAL | Status: DC

## 2014-12-12 NOTE — Patient Instructions (Signed)
Check blood sugars on waking up   times a week Also check blood sugars about 2 hours after a meal and do this after different meals by rotation  Recommended blood sugar levels on waking up is 90-130 and about 2 hours after meal is 130-160  Please bring your blood sugar monitor to each visit, thank you  Invokana 300mg  in am; use 2 in am for 100mg  tabs  Next Rx for Metformin will be ER prep.

## 2014-12-12 NOTE — Progress Notes (Signed)
Patient ID: Troy Randall, male   DOB: 1960/07/18, 54 y.o.   MRN: [id number]           Reason for Appointment: Follow-up for Type 2 Diabetes   History of Present Illness:          Date of diagnosis of type 2 diabetes mellitus: 2013       Background history:   He thinks he was diagnosed to have diabetes in 2015 that he had a glucose of 250 in the hospital in 2013. Last year in 11/15 he was started on metformin and glipizide when he was found to have diabetes.  Did not have any symptoms initially.  Glipizide has been subsequently increased in dosage He has never been monitoring his blood sugar His A1c was as high as 11.2% in 11/15  Recent history:   He has had an A1c  persistently over 8% this year He has been started on Victoza and Invokana on his initial consultation in addition to his glipizide and metformin He has titrated the Victoza to 1.2 mg; he has no nausea but with the 1.2 mg he has occasional nocturnal diarrhea.  However he tends to have some loose stools chronically No side effects from Invokana Glipizide was reduced to 1 tablet  Current blood sugar patterns, management and problems identified:  He is still not able to lose weight despite starting Victoza and Invokana  He does say that he is able to cut back on his portions significantly with starting Victoza  Currently not able to exercise because of low back pain  Has not been scheduled to see dietitian yet  He is checking blood sugars only in the morning despite instructions on variable monitoring times  Non-insulin hypoglycemic drugs the patient is taking are: Glipizide Er 10mg  bid, metformin 1 g twice a day       Side effects from medications have been: None  Compliance with the medical regimen: Fair Hypoglycemia:   never  Glucose monitoring:  done 0.7 times a day         Glucometer:  One Touch Blood Glucose readings   Mean values apply above for all meters except median for One Touch  PRE-MEAL Fasting  Lunch Dinner Bedtime Overall  Glucose range: 123-286      Mean/median:  175      175    Self-care: The diet that the patient has been following is: tries to limit sweet drinks.     Typical meal intake: Breakfast is biscuit , may skip lunch.  Dinner is meat and vegetables.  Will have popcorn for snacks.                Dietician visit, most recent: never               Exercise: none, unable to do much because of back pain  Weight history: 285-315  Wt Readings from Last 3 Encounters:  12/12/14 303 lb 9.6 oz (137.712 kg)  11/21/14 304 lb 9.6 oz (138.166 kg)  04/07/11 302 lb (136.986 kg)    Glycemic control:    Lab Results  Component Value Date   HGBA1C 8.3* 09/26/2014   Lab Results  Component Value Date   LDLCALC 115 09/26/2014   CREATININE 1.1 09/26/2014         Medication List       This list is accurate as of: 12/12/14  5:02 PM.  Always use your most recent med list.  amLODipine-benazepril 5-20 MG capsule  Commonly known as:  LOTREL  Take 1 capsule by mouth daily.     atorvastatin 10 MG tablet  Commonly known as:  LIPITOR     canagliflozin 300 MG Tabs tablet  Commonly known as:  INVOKANA  Take 300 mg by mouth daily before breakfast.     fenofibrate 160 MG tablet     glipiZIDE 10 MG 24 hr tablet  Commonly known as:  GLUCOTROL XL     glucose blood test strip  Commonly known as:  ONETOUCH VERIO  Use as instructed to check blood sugar once a day dx code E11.65     ibuprofen 200 MG tablet  Commonly known as:  ADVIL,MOTRIN  Take 400-600 mg by mouth every 6 (six) hours as needed for moderate pain.     Insulin Pen Needle 32G X 5 MM Misc  Commonly known as:  NOVOTWIST  Use one daily to inject Victoza.     metFORMIN 1000 MG tablet  Commonly known as:  GLUCOPHAGE     montelukast 10 MG tablet  Commonly known as:  SINGULAIR     ONETOUCH DELICA LANCETS 33G Misc  Use to check blood sugar once a day dx code E11.65     traMADol 50 MG tablet    Commonly known as:  ULTRAM  Take 50 mg by mouth 2 (two) times daily.     VICTOZA 18 MG/3ML Sopn  Generic drug:  Liraglutide  Inject 0.2 mLs (1.2 mg total) into the skin daily. Inject once daily at the same time     zolpidem 10 MG tablet  Commonly known as:  AMBIEN  take 1 tablet at bedtime if needed DO NOT FILL UNTIL 10-23-2014.        Allergies:  Allergies  Allergen Reactions  . Penicillins Hives    Past Medical History  Diagnosis Date  . Hypertension   . Arthritis   . Kidney stones   . Anemia     Past Surgical History  Procedure Laterality Date  . Hernia repair      umbilical x1 Incisional x1  . Incisional hernia repair  04/13/2011    Procedure: LAPAROSCOPIC INCISIONAL HERNIA;  Surgeon: Dalia Heading, MD;  Location: [facility];  Service: General;  Laterality: N/A;  Recurrent Laparoscopic Incisional Herniorraphy with Mesh  . Kidney stone surgery    . Ankle surgery      Family History  Problem Relation Age of Onset  . Anesthesia problems Neg Hx   . Hypotension Neg Hx   . Malignant hyperthermia Neg Hx   . Pseudochol deficiency Neg Hx   . Diabetes Father     Social History:  reports that he has never smoked. He uses smokeless tobacco. He reports that he does not drink alcohol or use illicit drugs.    Review of Systems    Lipid history: On Lipitor and fenofibrate for several months, baseline lipids not available    Lab Results  Component Value Date   HDL 36 09/26/2014   LDLCALC 115 09/26/2014   TRIG 384* 09/26/2014            Eyes: no history of blurred vision.  Most recent eye exam was in 2015, not clear if complete exam was done   Hypertension: On medications for 10+ years, treated by PCP   LABS:  No visits with results within 1 Week(s) from this visit. Latest known visit with results is:  Office Visit on 11/21/2014  Component Date  Value Ref Range Status  . Creatinine 09/26/2014 1.1  0.6 - 1.3 mg/dL Final  . Triglycerides 09/26/2014 384* 40 -  160 mg/dL Final  . HDL 16/10/9602 36  35 - 70 mg/dL Final  . LDL Cholesterol 09/26/2014 115   Final  . Hgb A1c MFr Bld 09/26/2014 8.3* 4.0 - 6.0 % Final    Physical Examination:  BP 130/86 mmHg  Pulse 95  Temp(Src) 98.4 F (36.9 C)  Resp 14  Ht 5' 11.5" (1.816 m)  Wt 303 lb 9.6 oz (137.712 kg)  BMI 41.76 kg/m2  SpO2 95%       ASSESSMENT:  Diabetes type 2, uncontrolled with severe obesity  He has had significant hyperglycemia at onset and previously inadequate control with metformin and glipizide Blood sugars are improving with starting Invokana and Victoza Although he has had some occasional diarrhea with Victoza he is generally tolerating this well and is able to reduce his portions with this  His fasting readings at home recently are mostly in the 150-160 range, previously as high as 286 Surprisingly has not lost any weight Also not doing postprandial monitoring  HYPERTENSION: Blood pressure appears to be relatively better    PLAN:    Start increasing Invokana, he will use 2 tablets of the 100 mg and then changed to 300 mg  Discussed timing of glucose monitoring including postprandial readings  Continue 1.2 Victoza  Consultation with dietitian  He can switch to metformin ER once he finishes his metformin because of his tendency to diarrhea  Patient Instructions  Check blood sugars on waking up   times a week Also check blood sugars about 2 hours after a meal and do this after different meals by rotation  Recommended blood sugar levels on waking up is 90-130 and about 2 hours after meal is 130-160  Please bring your blood sugar monitor to each visit, thank you  Invokana  in am; use 2 in am for  tabs  Next Rx for Metformin will be ER prep.     Rafaella Kole 12/12/2014, 5:02 PM   Note: This office note was prepared with Dragon voice recognition system technology. Any transcriptional errors that result from this process are unintentional.

## 2014-12-13 LAB — FRUCTOSAMINE: FRUCTOSAMINE: 279 umol/L (ref 0–285)

## 2014-12-28 ENCOUNTER — Telehealth: Payer: Self-pay | Admitting: Endocrinology

## 2014-12-28 ENCOUNTER — Other Ambulatory Visit: Payer: Self-pay | Admitting: *Deleted

## 2014-12-28 NOTE — Telephone Encounter (Signed)
Disregard previous response. If he is having diarrhea he can stop the metformin for 2 days but continue Victoza Subsequently start metformin ER 1 tablet daily for 5 days and then 2 tablets daily at dinner until his follow-up

## 2014-12-28 NOTE — Telephone Encounter (Signed)
Patient's wife wants to know if he should still take the metformin and Victoza? Please advise.

## 2014-12-28 NOTE — Telephone Encounter (Signed)
Troy Randall

## 2014-12-28 NOTE — Telephone Encounter (Signed)
Patient's wife called stating that her husband was having a lot of loose bowels   Patient believes this is a side effect from his medication  Per Dr. Lucianne Muss patient was to call if symptoms have worsened   Please advise    Thank you

## 2014-12-28 NOTE — Telephone Encounter (Signed)
Please see below and advise.

## 2014-12-28 NOTE — Telephone Encounter (Signed)
Noted, patients wife is aware and will call back if he is still having problems

## 2015-01-10 ENCOUNTER — Other Ambulatory Visit: Payer: Self-pay | Admitting: *Deleted

## 2015-01-10 ENCOUNTER — Telehealth: Payer: Self-pay | Admitting: Endocrinology

## 2015-01-10 MED ORDER — METFORMIN HCL ER 750 MG PO TB24: ORAL_TABLET | ORAL | Status: DC

## 2015-01-10 NOTE — Telephone Encounter (Signed)
Noted, message left on patients voice mail, rx sent

## 2015-01-10 NOTE — Telephone Encounter (Signed)
Change the prescription to metformin ER 750 mg, 2 tablets at dinner

## 2015-01-10 NOTE — Telephone Encounter (Signed)
Please see below and advise.

## 2015-01-10 NOTE — Telephone Encounter (Signed)
Patient wife stated that metformin is still making him sick, please advise

## 2015-01-17 ENCOUNTER — Encounter (INDEPENDENT_AMBULATORY_CARE_PROVIDER_SITE_OTHER): Payer: Self-pay | Admitting: *Deleted

## 2015-01-23 ENCOUNTER — Encounter: Payer: Self-pay | Admitting: Endocrinology

## 2015-01-23 ENCOUNTER — Ambulatory Visit (INDEPENDENT_AMBULATORY_CARE_PROVIDER_SITE_OTHER): Payer: BLUE CROSS/BLUE SHIELD | Admitting: Endocrinology

## 2015-01-23 VITALS — BP 138/84 | HR 104 | Temp 98.0°F | Resp 16 | Ht 71.5 in | Wt 300.0 lb

## 2015-01-23 DIAGNOSIS — E119 Type 2 diabetes mellitus without complications: Secondary | ICD-10-CM

## 2015-01-23 LAB — POCT GLYCOSYLATED HEMOGLOBIN (HGB A1C): Hemoglobin A1C: 7.5

## 2015-01-23 MED ORDER — SILDENAFIL CITRATE 100 MG PO TABS: 100.0000 mg | ORAL_TABLET | Freq: Every day | ORAL | Status: DC | PRN

## 2015-01-23 MED ORDER — VICTOZA 18 MG/3ML ~~LOC~~ SOPN: 1.8000 mg | PEN_INJECTOR | Freq: Every day | SUBCUTANEOUS | Status: DC

## 2015-01-23 NOTE — Progress Notes (Signed)
Patient ID: Troy Randall, male   DOB: 28-Sep-1960, 55 y.o.   MRN: [id number]           Reason for Appointment: Follow-up for Type 2 Diabetes   History of Present Illness:          Date of diagnosis of type 2 diabetes mellitus: 2013       Background history:   He thinks he was diagnosed to have diabetes in 2015 that he had a glucose of 250 in the hospital in 2013. Last year in 11/15 he was started on metformin and glipizide when he was found to have diabetes.  Did not have any symptoms initially.  Glipizide has been subsequently increased in dosage He has never been monitoring his blood sugar His A1c was as high as 11.2% in 11/15  Recent history:   He has had an A1c  persistently over 8% last year He has been on Victoza and Invokana  in addition to his glipizide and metformin  Although he was having some issues with diarrhea this apparently got worse and was related to metformin This was changed to metformin ER about 2 weeks ago He is taking Victoza without any side effects and he thinks it is helping with his satiety, currently taking 1.2 mg No side effects from Invokana which has been increased to 300 mg Glipizide was reduced to 1 tablet  Current blood sugar patterns, management and problems identified:  He is still not able to lose significant amount of weight despite Victoza and Invokana  He has done a few more readings in the afternoons and around 6-7 PM but not clear if some of the readings are before eating or after  His blood sugars are as high as 352 during the holidays  He does not always cut back on drinks like lemonade and this would raise his blood sugar  His fasting readings have come down in the last few days, previously as high as 184  In the last few days he has not had any diarrhea from the metformin ER   Non-insulin hypoglycemic drugs : Glipizide Er 10mg , metformin ER, 750 minute gram, 2 tablets daily, Invokana 300 mg daily      Side effects from  medications have been: diarrhea from high dose metformin  Compliance with the medical regimen: Fair Hypoglycemia:   never  Glucose monitoring:  done 0.7 times a day         Glucometer:  One Touch Blood Glucose readings   Mean values apply above for all meters except median for One Touch  PRE-MEAL Fasting Lunch 6-7 PM Bedtime Overall  Glucose range: 119-184 166-179 128-222    Mean/median: 165    170+/-56    Self-care: The diet that the patient has been following is: tries to limit sweet drinks but not consistently.     Typical meal intake: Breakfast is biscuit , may skip lunch.  Dinner is meat and vegetables at 6-7 pm.  Will have popcorn for snacks.                Dietician visit, most recent: never                Exercise: none, unable to do much because of back pain  Weight history: 285-315  Wt Readings from Last 3 Encounters:  01/23/15 300 lb (136.079 kg)  12/12/14 303 lb 9.6 oz (137.712 kg)  11/21/14 304 lb 9.6 oz (138.166 kg)    Glycemic control:  Lab Results  Component Value Date   HGBA1C 7.5 01/23/2015   HGBA1C 8.3* 09/26/2014   Lab Results  Component Value Date   MICROALBUR 1.5 12/12/2014   LDLCALC 115 09/26/2014   CREATININE 0.97 12/12/2014         Medication List       This list is accurate as of: 01/23/15  5:06 PM.  Always use your most recent med list.               amLODipine-benazepril 5-20 MG capsule  Commonly known as:  LOTREL  Take 1 capsule by mouth daily.     atorvastatin 10 MG tablet  Commonly known as:  LIPITOR     canagliflozin 300 MG Tabs tablet  Commonly known as:  INVOKANA  Take 300 mg by mouth daily before breakfast.     fenofibrate 160 MG tablet     glipiZIDE 10 MG 24 hr tablet  Commonly known as:  GLUCOTROL XL     glucose blood test strip  Commonly known as:  ONETOUCH VERIO  Use as instructed to check blood sugar once a day dx code E11.65     ibuprofen 200 MG tablet  Commonly known as:  ADVIL,MOTRIN  Take  400-600 mg by mouth every 6 (six) hours as needed for moderate pain.     Insulin Pen Needle 32G X 5 MM Misc  Commonly known as:  NOVOTWIST  Use one daily to inject Victoza.     metFORMIN 750 MG 24 hr tablet  Commonly known as:  GLUCOPHAGE XR  Take 2 tablets daily with Dinner     montelukast 10 MG tablet  Commonly known as:  SINGULAIR     ONETOUCH DELICA LANCETS 33G Misc  Use to check blood sugar once a day dx code E11.65     sildenafil 100 MG tablet  Commonly known as:  VIAGRA  Take 1 tablet (100 mg total) by mouth daily as needed for erectile dysfunction.     traMADol 50 MG tablet  Commonly known as:  ULTRAM  Take 50 mg by mouth 2 (two) times daily.     VICTOZA 18 MG/3ML Sopn  Generic drug:  Liraglutide  Inject 0.3 mLs (1.8 mg total) into the skin daily. Inject once daily at the same time     zolpidem 10 MG tablet  Commonly known as:  AMBIEN  take 1 tablet at bedtime if needed DO NOT FILL UNTIL 10-23-2014.        Allergies:  Allergies  Allergen Reactions  . Penicillins Hives    Past Medical History  Diagnosis Date  . Hypertension   . Arthritis   . Kidney stones   . Anemia     Past Surgical History  Procedure Laterality Date  . Hernia repair      umbilical x1 Incisional x1  . Incisional hernia repair  04/13/2011    Procedure: LAPAROSCOPIC INCISIONAL HERNIA;  Surgeon: Dalia Heading, MD;  Location: [facility];  Service: General;  Laterality: N/A;  Recurrent Laparoscopic Incisional Herniorraphy with Mesh  . Kidney stone surgery    . Ankle surgery      Family History  Problem Relation Age of Onset  . Anesthesia problems Neg Hx   . Hypotension Neg Hx   . Malignant hyperthermia Neg Hx   . Pseudochol deficiency Neg Hx   . Diabetes Father     Social History:  reports that he has never smoked. He uses smokeless tobacco. He reports that he  does not drink alcohol or use illicit drugs.    Review of Systems    Lipid history: On Lipitor and fenofibrate for  several months, baseline lipids not available, has high triglycerides    Lab Results  Component Value Date   HDL 36 09/26/2014   LDLCALC 115 09/26/2014   TRIG 384* 09/26/2014            Eyes:   Most recent eye exam was in 2015, not clear if complete exam was done   Hypertension: On medications for 10+ years, treated by PCP   LABS:  Office Visit on 01/23/2015  Component Date Value Ref Range Status  . Hemoglobin A1C 01/23/2015 7.5   Final    Physical Examination:  BP 138/84 mmHg  Pulse 104  Temp(Src) 98 F (36.7 C)  Resp 16  Ht 5' 11.5" (1.816 m)  Wt 300 lb (136.079 kg)  BMI 41.26 kg/m2  SpO2 94%       ASSESSMENT:   Diabetes type 2, uncontrolled with obesity  Blood sugars are improving with Invokana and Victoza Apparently he was having diarrhea with metformin but doing better recently with metformin ER 1500 mg a day His fasting blood sugars are recently somewhat better He is now getting some post prandial levels that are high and occasionally over 200 based on his compliance with diet Weight is down 3 pounds  HYPERTENSION: Blood pressure is high normal   PLAN:    Increase Victoza to 1.8 and hopefully this will help his postprandial hyperglycemia and portion control  More postprandial blood sugars  Increase exercise as tolerated  Continue 1500 mg a day of metformin ER and 300 mg Invokana  Follow-up in 3 months  Patient Instructions  Check blood sugars on waking up  2-3  times a week Also check blood sugars about 2 hours after a meal and do this after different meals by rotation  Recommended blood sugar levels on waking up is 90-130 and about 2 hours after meal is 130-160  Please bring your blood sugar monitor to each visit, thank you  Victoza 1.8mg  daily  Walk as tolerated      Amisha Pospisil 01/23/2015, 5:06 PM   Note: This office note was prepared with Dragon voice recognition system technology. Any transcriptional errors that result from  this process are unintentional.

## 2015-01-23 NOTE — Patient Instructions (Signed)
Check blood sugars on waking up  2-3  times a week Also check blood sugars about 2 hours after a meal and do this after different meals by rotation  Recommended blood sugar levels on waking up is 90-130 and about 2 hours after meal is 130-160  Please bring your blood sugar monitor to each visit, thank you  Victoza 1.8mg  daily  Walk as tolerated

## 2015-03-20 ENCOUNTER — Telehealth: Payer: Self-pay | Admitting: *Deleted

## 2015-03-20 ENCOUNTER — Telehealth: Payer: Self-pay | Admitting: Endocrinology

## 2015-03-20 MED ORDER — CANAGLIFLOZIN 300 MG PO TABS: 300.0000 mg | ORAL_TABLET | Freq: Every day | ORAL | Status: DC

## 2015-03-20 NOTE — Telephone Encounter (Signed)
[facility] note dated 03/19/15 5:10 pm Spouse [name] calling stating that pt needs to speak with someone regarding his meds. Needs mailed as 90 day supply. Insurance needs the change made please. Spoke with the caller with the pt's permission. Was advised to contact the office in the AM regarding the prescription.

## 2015-03-20 NOTE — Telephone Encounter (Signed)
If he is taking 2 tablets then he can try taking one tablet after supper only

## 2015-03-20 NOTE — Telephone Encounter (Signed)
Patient called he said whenever he starts taking the metformin his causes bad diarrhea and makes his stomach very upset, he's had to go home and change clothes. Does he still need to take this medication? Please advise.

## 2015-03-20 NOTE — Telephone Encounter (Signed)
Noted, message left on his voice mail.

## 2015-03-25 ENCOUNTER — Telehealth: Payer: Self-pay | Admitting: *Deleted

## 2015-03-25 NOTE — Telephone Encounter (Signed)
error 

## 2015-03-28 ENCOUNTER — Telehealth: Payer: Self-pay | Admitting: Endocrinology

## 2015-03-28 NOTE — Telephone Encounter (Signed)
Pt wife said that she called the insurance and that the only affordable alternative to Invokana is Glyburide.  She said that if you wanted to go ahead and fill this medication you can call it into [facility] in [place].

## 2015-03-28 NOTE — Telephone Encounter (Signed)
Please see below and advise.

## 2015-03-28 NOTE — Telephone Encounter (Signed)
That is not the same type of medication, will discuss on the next visit

## 2015-03-29 NOTE — Telephone Encounter (Signed)
Noted, detailed message left

## 2015-04-23 ENCOUNTER — Encounter: Payer: Self-pay | Admitting: Endocrinology

## 2015-04-23 ENCOUNTER — Ambulatory Visit (INDEPENDENT_AMBULATORY_CARE_PROVIDER_SITE_OTHER): Payer: BLUE CROSS/BLUE SHIELD | Admitting: Endocrinology

## 2015-04-23 ENCOUNTER — Other Ambulatory Visit: Payer: Self-pay | Admitting: *Deleted

## 2015-04-23 VITALS — BP 132/88 | HR 80 | Temp 97.9°F | Resp 16 | Ht 71.5 in | Wt 303.2 lb

## 2015-04-23 DIAGNOSIS — E119 Type 2 diabetes mellitus without complications: Secondary | ICD-10-CM

## 2015-04-23 LAB — POCT GLYCOSYLATED HEMOGLOBIN (HGB A1C): HEMOGLOBIN A1C: 9

## 2015-04-23 MED ORDER — INSULIN GLARGINE 300 UNIT/ML ~~LOC~~ SOPN
30.0000 [IU] | PEN_INJECTOR | Freq: Every day | SUBCUTANEOUS | Status: DC
Start: 2015-04-23 — End: 2015-08-12

## 2015-04-23 NOTE — Progress Notes (Signed)
Patient ID: Troy Randall, male   DOB: 06/19/1960, 55 y.o.   MRN: [id number]           Reason for Appointment: Follow-up for Type 2 Diabetes   History of Present Illness:          Date of diagnosis of type 2 diabetes mellitus: 2013       Background history:   He thinks he was diagnosed to have diabetes in 2015 that he had a glucose of 250 in the hospital in 2013. Last year in 11/15 he was started on metformin and glipizide when he was found to have diabetes.  Did not have any symptoms initially.  Glipizide has been subsequently increased in dosage He has never been monitoring his blood sugar His A1c was as high as 11.2% in 11/15  Recent history:   He has had an A1c  of 7.5 with taking Victoza, Invokana as well as glipizide/metformin as of 1/17 Previously had been persistently over 8% last year He has been unable to get  Victoza and Invokana for the last 2-4 weeks because of difficulty getting insurance coverage through his mail order supply  Although he was having some tolerability with his metformin this again started causing diarrhea and he has not taken this except as a trial again last week Glipizide was reduced to 1 tablet previously and he is only taking this as his treatment by itself now He is getting poor control of his blood sugars now, averaging nearly 250  Current blood sugar patterns, management and problems identified:  He is still not able to lose any weight.  Previously was finding that he was having significant satiety with using Victoza  His blood sugars are the same throughout the day with fairly consistently high readings in the mornings; however he has not checked his nonfasting readings much in the last 2 weeks  He tried to take metformin last week when sugars were higher with he could not tolerate this    Non-insulin hypoglycemic drugs : Glipizide Er 10mg    Side effects from medications have been: diarrhea from high dose metformin  Compliance with the  medical regimen: Fair Hypoglycemia:   never  Glucose monitoring:  done about 1 times a day on average        Glucometer:  One Touch Blood Glucose readings from download for the last 4 weeks  Mean values apply above for all meters except median for One Touch  PRE-MEAL Fasting Lunch Dinner Bedtime Overall  Glucose range: 139-346  2 57-318  141-317  247-280    Mean/median: 273     262      Self-care: The diet that the patient has been following is: tries to limit sweet drinks but not consistently.     Typical meal intake: Breakfast is biscuit , may skip lunch.  Dinner is meat and vegetables at 6-7 pm.  Will have popcorn for snacks.                Dietician visit, most recent: never                Exercise: none, unable to do much because of back pain  Weight history: Previous range 285-315  Wt Readings from Last 3 Encounters:  04/23/15 303 lb 3.2 oz (137.531 kg)  01/23/15 300 lb (136.079 kg)  12/12/14 303 lb 9.6 oz (137.712 kg)    Glycemic control:    Lab Results  Component Value Date   HGBA1C 9.0 04/23/2015  HGBA1C 7.5 01/23/2015   HGBA1C 8.3* 09/26/2014   Lab Results  Component Value Date   MICROALBUR 1.5 12/12/2014   LDLCALC 115 09/26/2014   CREATININE 0.97 12/12/2014         Medication List       This list is accurate as of: 04/23/15  8:54 AM.  Always use your most recent med list.               amLODipine-benazepril 5-20 MG capsule  Commonly known as:  LOTREL  Take 1 capsule by mouth daily.     atorvastatin 10 MG tablet  Commonly known as:  LIPITOR     canagliflozin 300 MG Tabs tablet  Commonly known as:  INVOKANA  Take 1 tablet (300 mg total) by mouth daily before breakfast.     fenofibrate 160 MG tablet     glipiZIDE 10 MG 24 hr tablet  Commonly known as:  GLUCOTROL XL     glucose blood test strip  Commonly known as:  ONETOUCH VERIO  Use as instructed to check blood sugar once a day dx code E11.65     ibuprofen 200 MG tablet  Commonly  known as:  ADVIL,MOTRIN  Take 400-600 mg by mouth every 6 (six) hours as needed for moderate pain.     Insulin Pen Needle 32G X 5 MM Misc  Commonly known as:  NOVOTWIST  Use one daily to inject Victoza.     metFORMIN 750 MG 24 hr tablet  Commonly known as:  GLUCOPHAGE XR  Take 2 tablets daily with Dinner     montelukast 10 MG tablet  Commonly known as:  SINGULAIR     ONETOUCH DELICA LANCETS 33G Misc  Use to check blood sugar once a day dx code E11.65     sildenafil 100 MG tablet  Commonly known as:  VIAGRA  Take 1 tablet (100 mg total) by mouth daily as needed for erectile dysfunction.     traMADol 50 MG tablet  Commonly known as:  ULTRAM  Take 50 mg by mouth 2 (two) times daily.     VICTOZA 18 MG/3ML Sopn  Generic drug:  Liraglutide  Inject 0.3 mLs (1.8 mg total) into the skin daily. Inject once daily at the same time     zolpidem 10 MG tablet  Commonly known as:  AMBIEN  Reported on 04/23/2015        Allergies:  Allergies  Allergen Reactions  . Penicillins Hives    Past Medical History  Diagnosis Date  . Hypertension   . Arthritis   . Kidney stones   . Anemia     Past Surgical History  Procedure Laterality Date  . Hernia repair      umbilical x1 Incisional x1  . Incisional hernia repair  04/13/2011    Procedure: LAPAROSCOPIC INCISIONAL HERNIA;  Surgeon: Dalia Heading, MD;  Location: [facility];  Service: General;  Laterality: N/A;  Recurrent Laparoscopic Incisional Herniorraphy with Mesh  . Kidney stone surgery    . Ankle surgery      Family History  Problem Relation Age of Onset  . Anesthesia problems Neg Hx   . Hypotension Neg Hx   . Malignant hyperthermia Neg Hx   . Pseudochol deficiency Neg Hx   . Diabetes Father     Social History:  reports that he has never smoked. He uses smokeless tobacco. He reports that he does not drink alcohol or use illicit drugs.    Review of Systems  Lipid history: On Lipitor and fenofibrate for several months,  baseline lipids not available, has high triglycerides    Lab Results  Component Value Date   HDL 36 09/26/2014   LDLCALC 115 09/26/2014   TRIG 384* 09/26/2014            Eyes:   Most recent eye exam was in 2015, not clear if complete exam was done   Hypertension: On medications for 10+ years, treated by PCP   LABS:  Office Visit on 04/23/2015  Component Date Value Ref Range Status  . Hemoglobin A1C 04/23/2015 9.0   Final    Physical Examination:  BP 132/88 mmHg  Pulse 80  Temp(Src) 97.9 F (36.6 C)  Resp 16  Ht 5' 11.5" (1.816 m)  Wt 303 lb 3.2 oz (137.531 kg)  BMI 41.70 kg/m2  SpO2 96%       ASSESSMENT:   Diabetes type 2, uncontrolled with obesity  Blood sugars were previously improving significantly with Invokana and Victoza Now he has difficulty affording his medications from his mail-order supply company and has not taken any medications except glipizide for the last 3-4 weeks with marked hyperglycemia He also had better control of his appetite with Victoza Cannot tolerate metformin because of diarrhea, even 750 mg   HYPERTENSION: Blood pressure is high normal, may improve with restarting Invokana   PLAN:    Start Toujeo insulin with the free co-pay card.  He was started with 20 units.  Discussed actions of basal insulins, timing of injection and also given him a flowsheet with detailed instructions on how to adjust the dose every 3 days based on fasting blood sugar patterns.  He will increase the dose by 2 units at a time  He will try to get the coverage for his Invokana and Victoza with the rebates form mail-order company and given him the brochure was for this.  He may need to reduce his Toujeo when blood sugars are improving with restarting Invokana and Victoza  Consider Troy Randall if covered by insurance  Check blood sugars more consistently at various times  Consistent diet  Follow-up in 4 weeks  Consultation with diabetes educator for general  education and meal planning  Patient Instructions  TOUJEO insulin: This insulin provides blood sugar control for up to 24 hours.  Start with 20 units at bedtime daily and increase by 2 units every 3 days until the waking up sugars are under 130.  Then continue the same dose. If blood sugar is under 90 for 2 days in a row, reduce the dose by 2 units.  Note that this insulin does not control the rise of blood sugar with meals    When restarting Victoza start with 0.6mg  for 3 days then 1.2  Check coverage for SOLIQUA   Check blood sugars on waking up 3  times a week Also check blood sugars about 2 hours after a meal and do this after different meals by rotation  Recommended blood sugar levels on waking up is 90-130 and about 2 hours after meal is 130-160  Please bring your blood sugar monitor to each visit, thank you      Counseling time on subjects discussed above is over 50% of today's 25 minute visit  Troy Randall 04/23/2015, 8:54 AM   Note: This office note was prepared with Dragon voice recognition system technology. Any transcriptional errors that result from this process are unintentional.

## 2015-04-23 NOTE — Patient Instructions (Signed)
TOUJEO insulin: This insulin provides blood sugar control for up to 24 hours.  Start with 20 units at bedtime daily and increase by 2 units every 3 days until the waking up sugars are under 130.  Then continue the same dose. If blood sugar is under 90 for 2 days in a row, reduce the dose by 2 units.  Note that this insulin does not control the rise of blood sugar with meals    When restarting Victoza start with 0.6mg  for 3 days then 1.2  Check coverage for SOLIQUA   Check blood sugars on waking up 3  times a week Also check blood sugars about 2 hours after a meal and do this after different meals by rotation  Recommended blood sugar levels on waking up is 90-130 and about 2 hours after meal is 130-160  Please bring your blood sugar monitor to each visit, thank you

## 2015-04-25 ENCOUNTER — Other Ambulatory Visit: Payer: Self-pay | Admitting: *Deleted

## 2015-04-25 ENCOUNTER — Telehealth: Payer: Self-pay | Admitting: Endocrinology

## 2015-04-25 MED ORDER — INSULIN PEN NEEDLE 32G X 5 MM MISC: Status: AC

## 2015-04-25 NOTE — Telephone Encounter (Signed)
rx has been sent to [facility]

## 2015-04-25 NOTE — Telephone Encounter (Signed)
Please call in the needles for the toujeo pen call to [facility]

## 2015-05-03 ENCOUNTER — Telehealth: Payer: Self-pay | Admitting: Endocrinology

## 2015-05-03 ENCOUNTER — Other Ambulatory Visit: Payer: Self-pay | Admitting: *Deleted

## 2015-05-03 MED ORDER — VICTOZA 18 MG/3ML ~~LOC~~ SOPN: 1.8000 mg | PEN_INJECTOR | Freq: Every day | SUBCUTANEOUS | Status: DC

## 2015-05-03 NOTE — Telephone Encounter (Signed)
I spoke with his wife and per Dr. Remus Blake last note it says TOUJEO insulin: This insulin provides blood sugar control for up to 24 hours. Start with 20 units at bedtime daily and increase by 2 units every 3 days until the waking up sugars are under 130.  Then continue the same dose. If blood sugar is under 90 for 2 days in a row, reduce the dose by 2 units.  Note that this insulin does not control the rise of blood sugar with meals.  Wife voiced understanding

## 2015-05-03 NOTE — Telephone Encounter (Signed)
Patient wife is calling ask how her husband is suppose to be taking his insulin medication, please advise

## 2015-05-21 ENCOUNTER — Encounter: Payer: Self-pay | Admitting: Endocrinology

## 2015-05-21 ENCOUNTER — Ambulatory Visit (INDEPENDENT_AMBULATORY_CARE_PROVIDER_SITE_OTHER): Payer: BLUE CROSS/BLUE SHIELD | Admitting: Endocrinology

## 2015-05-21 VITALS — BP 134/88 | HR 81 | Temp 97.7°F | Resp 16 | Ht 71.5 in | Wt 301.4 lb

## 2015-05-21 DIAGNOSIS — E1165 Type 2 diabetes mellitus with hyperglycemia: Secondary | ICD-10-CM

## 2015-05-21 DIAGNOSIS — Z794 Long term (current) use of insulin: Secondary | ICD-10-CM

## 2015-05-21 NOTE — Progress Notes (Signed)
Patient ID: Troy Randall, male   DOB: [birth year], 55 y.o.   MRN: [id number]           Reason for Appointment: Follow-up for Type 2 Diabetes   History of Present Illness:          Date of diagnosis of type 2 diabetes mellitus: 2013       Background history:   He thinks he was diagnosed to have diabetes in 2015 that he had a glucose of 250 in the hospital in 2013. Last year in 11/15 he was started on metformin and glipizide when he was found to have diabetes.  Did not have any symptoms initially.  Glipizide has been subsequently increased in dosage He has never been monitoring his blood sugar His A1c was as high as 11.2% in 11/15  Recent history:   He  had an A1c  of 7.5 with taking Victoza, Invokana as well as glipizide/metformin as of 1/17 Previously had been persistently over 8% last year He has been unable to get  Victoza and Invokana since about 03/2015 because of difficulty getting insurance coverage through his mail order supply  With this his blood sugars increased sharply and were averaging over 200 For this reason Tresiba insulin was started at 20 units and he has titrated this up to 40 units as of last evening  Current blood sugar patterns, management and problems identified:  He is still not getting any improvement in his blood sugars and they are averaging given higher than on his last visit with taking basal insulin and glipizide only  Recently has not done any readings after meals but last month was getting readings mostly in the 200+ range in the afternoons and evenings  He does not think he is symptomatic from the high sugars, not having any increased thirst or urination  He was recommended a consultation with the diabetes nurse educator but he has not done so because of cost  Non-insulin hypoglycemic drugs : Glipizide Er 10mg    Side effects from medications have been: diarrhea from metformin  Compliance with the medical regimen: Fair Hypoglycemia:    never  Glucose monitoring:  done about 1 times a day on average        Glucometer:  One Touch Blood Glucose readings from download for the last 4 weeks  Mean values apply above for all meters except median for One Touch  PRE-MEAL Fasting Lunch Dinner Bedtime Overall  Glucose range: 224-390  263-335      Mean/median: 270    276     Self-care: The diet that the patient has been following is: tries to limit sweet drinks but not consistently.     Typical meal intake: Breakfast is biscuit , may skip lunch.  Dinner is meat and vegetables at 6-7 pm.  Will have popcorn for snacks.                Dietician visit, most recent: never                Exercise: none, unable to do much because of back pain  Weight history: Previous range 285-315  Wt Readings from Last 3 Encounters:  05/21/15 301 lb 6.4 oz (136.714 kg)  04/23/15 303 lb 3.2 oz (137.531 kg)  01/23/15 300 lb (136.079 kg)    Glycemic control:    Lab Results  Component Value Date   HGBA1C 9.0 04/23/2015   HGBA1C 7.5 01/23/2015   HGBA1C 8.3* 09/26/2014   Lab Results  Component Value Date   MICROALBUR 1.5 12/12/2014   LDLCALC 115 09/26/2014   CREATININE 0.97 12/12/2014         Medication List       This list is accurate as of: 05/21/15  9:08 PM.  Always use your most recent med list.               amLODipine-benazepril 5-20 MG capsule  Commonly known as:  LOTREL  Take 1 capsule by mouth daily.     atorvastatin 10 MG tablet  Commonly known as:  LIPITOR     canagliflozin 300 MG Tabs tablet  Commonly known as:  INVOKANA  Take 1 tablet (300 mg total) by mouth daily before breakfast.     fenofibrate 160 MG tablet     glipiZIDE 10 MG 24 hr tablet  Commonly known as:  GLUCOTROL XL     glucose blood test strip  Commonly known as:  ONETOUCH VERIO  Use as instructed to check blood sugar once a day dx code E11.65     ibuprofen 200 MG tablet  Commonly known as:  ADVIL,MOTRIN  Take 400-600 mg by mouth every 6  (six) hours as needed for moderate pain.     Insulin Glargine 300 UNIT/ML Sopn  Commonly known as:  TOUJEO SOLOSTAR  Inject 30 Units into the skin daily.     Insulin Pen Needle 32G X 5 MM Misc  Commonly known as:  NOVOTWIST  Use two daily to inject Victoza and Toujeo.     montelukast 10 MG tablet  Commonly known as:  SINGULAIR     ONETOUCH DELICA LANCETS 33G Misc  Use to check blood sugar once a day dx code E11.65     sildenafil 100 MG tablet  Commonly known as:  VIAGRA  Take 1 tablet (100 mg total) by mouth daily as needed for erectile dysfunction.     traMADol 50 MG tablet  Commonly known as:  ULTRAM  Take 50 mg by mouth 2 (two) times daily.     VICTOZA 18 MG/3ML Sopn  Generic drug:  Liraglutide  Inject 0.3 mLs (1.8 mg total) into the skin daily. Inject once daily at the same time     zolpidem 10 MG tablet  Commonly known as:  AMBIEN  Reported on 04/23/2015        Allergies:  Allergies  Allergen Reactions  . Penicillins Hives    Past Medical History  Diagnosis Date  . Hypertension   . Arthritis   . Kidney stones   . Anemia     Past Surgical History  Procedure Laterality Date  . Hernia repair      umbilical x1 Incisional x1  . Incisional hernia repair  04/13/2011    Procedure: LAPAROSCOPIC INCISIONAL HERNIA;  Surgeon: Dalia Heading, MD;  Location: [facility];  Service: General;  Laterality: N/A;  Recurrent Laparoscopic Incisional Herniorraphy with Mesh  . Kidney stone surgery    . Ankle surgery      Family History  Problem Relation Age of Onset  . Anesthesia problems Neg Hx   . Hypotension Neg Hx   . Malignant hyperthermia Neg Hx   . Pseudochol deficiency Neg Hx   . Diabetes Father     Social History:  reports that he has never smoked. He uses smokeless tobacco. He reports that he does not drink alcohol or use illicit drugs.    Review of Systems    Lipid history: On Lipitor and fenofibrate for several  months, baseline lipids not available, has high  triglycerides    Lab Results  Component Value Date   HDL 36 09/26/2014   LDLCALC 115 09/26/2014   TRIG 384* 09/26/2014            Eyes:   Most recent eye exam was in 2015  Hypertension: On medications for 10+ years, treated by PCP, taking Lotrel    LABS:  No visits with results within 1 Week(s) from this visit. Latest known visit with results is:  Office Visit on 04/23/2015  Component Date Value Ref Range Status  . Hemoglobin A1C 04/23/2015 9.0   Final    Physical Examination:  BP 134/88 mmHg  Pulse 81  Temp(Src) 97.7 F (36.5 C)  Resp 16  Ht 5' 11.5" (1.816 m)  Wt 301 lb 6.4 oz (136.714 kg)  BMI 41.46 kg/m2  SpO2 94%       ASSESSMENT:   Diabetes type 2, uncontrolled with obesity  Blood sugars were previously improving significantly with Invokana and Victoza Since he has not been able to get this because of insurance difficulties his blood sugars are poorly controlled with average reading well over 200 Has not responded to given 40 units of basal insulin He did not have significant postprandial hyperglycemia also previously with Invokana and Victoza He also had better control of his appetite with Victoza  He now says that he should be able to get Invokana covered by his insurance and is awaiting delivery of this from mail-order  HYPERTENSION: Blood pressure is high again, may improve with restarting Invokana   PLAN:    Start Invokana when available  Also start Victoza when he is able to get this but need to start with 0.6 mg for the first 5 days  Increase insulin by 10 units for now but discussed adjustment by 5 units at a time every 3 days and he can continue to use a flowsheet that he has been using  Discussed need to consistently check sugars at various times by rotation including after meals which he has not been doing  Will give him additional test strips on his prescription for this  Advised him and his wife to call if blood sugars are not  improving  He will check into the coverage for nutritional counseling from his insurance which he needs  Follow-up in 4 weeks  Patient Instructions  Toujeo 50 units daily; dose change 5 units at a time   If sugar <100 in am reduce 10 units  Check blood sugars on waking up  4-6 times a week Also check blood sugars about 2 hours after a meal and do this after different meals by rotation  Recommended blood sugar levels on waking up is 90-130 and about 2 hours after meal is 130-160  Please bring your blood sugar monitor to each visit, thank you  Victoza start 0.6mg  daily to start with    Counseling time on subjects discussed above is over 50% of today's 25 minute visit  Payam Gribble 05/21/2015, 9:08 PM   Note: This office note was prepared with Dragon voice recognition system technology. Any transcriptional errors that result from this process are unintentional.

## 2015-05-21 NOTE — Patient Instructions (Addendum)
Toujeo 50 units daily; dose change 5 units at a time   If sugar <100 in am reduce 10 units  Check blood sugars on waking up  4-6 times a week Also check blood sugars about 2 hours after a meal and do this after different meals by rotation  Recommended blood sugar levels on waking up is 90-130 and about 2 hours after meal is 130-160  Please bring your blood sugar monitor to each visit, thank you  Victoza start 0.6mg  daily to start with

## 2015-05-22 ENCOUNTER — Telehealth: Payer: Self-pay | Admitting: Endocrinology

## 2015-05-22 NOTE — Telephone Encounter (Signed)
PT wife said that since Dr. Lucianne Muss increased his dosage to 50 units yesterday, last night he got no sleep and that this morning his sugars were 138.  She said that this has happened before because of an issue with his medications.  She is just very concerned and would like to speak to you about this issue. CB# [phone]

## 2015-05-22 NOTE — Telephone Encounter (Signed)
Patient returning your call, stated you can call her in the morning.

## 2015-05-22 NOTE — Telephone Encounter (Signed)
Message left for patient to return call.

## 2015-06-06 ENCOUNTER — Other Ambulatory Visit: Payer: Self-pay | Admitting: *Deleted

## 2015-06-06 ENCOUNTER — Telehealth: Payer: Self-pay | Admitting: Endocrinology

## 2015-06-06 MED ORDER — GLUCOSE BLOOD VI STRP: ORAL_STRIP | Status: DC

## 2015-06-06 NOTE — Telephone Encounter (Signed)
Rx sent to [facility]

## 2015-06-06 NOTE — Telephone Encounter (Signed)
Test strips call into [facility] with the new testing frequency

## 2015-06-13 ENCOUNTER — Other Ambulatory Visit (INDEPENDENT_AMBULATORY_CARE_PROVIDER_SITE_OTHER): Payer: BLUE CROSS/BLUE SHIELD

## 2015-06-13 DIAGNOSIS — Z794 Long term (current) use of insulin: Secondary | ICD-10-CM

## 2015-06-13 DIAGNOSIS — E119 Type 2 diabetes mellitus without complications: Secondary | ICD-10-CM | POA: Diagnosis not present

## 2015-06-13 DIAGNOSIS — E1165 Type 2 diabetes mellitus with hyperglycemia: Secondary | ICD-10-CM

## 2015-06-13 LAB — COMPREHENSIVE METABOLIC PANEL
ALBUMIN: 4.2 g/dL (ref 3.5–5.2)
ALT: 19 U/L (ref 0–53)
AST: 16 U/L (ref 0–37)
Alkaline Phosphatase: 36 U/L — ABNORMAL LOW (ref 39–117)
BILIRUBIN TOTAL: 0.5 mg/dL (ref 0.2–1.2)
BUN: 15 mg/dL (ref 6–23)
CALCIUM: 9.6 mg/dL (ref 8.4–10.5)
CO2: 29 meq/L (ref 19–32)
CREATININE: 0.96 mg/dL (ref 0.40–1.50)
Chloride: 105 mEq/L (ref 96–112)
GFR: 87.17 mL/min (ref >60.00)
Glucose, Bld: 151 mg/dL — ABNORMAL HIGH (ref 70–99)
Potassium: 4 mEq/L (ref 3.5–5.1)
Sodium: 141 mEq/L (ref 135–145)
TOTAL PROTEIN: 7.2 g/dL (ref 6.0–8.3)

## 2015-06-13 LAB — LIPID PANEL
CHOLESTEROL: 173 mg/dL (ref 0–200)
HDL: 31 mg/dL — ABNORMAL LOW (ref >39.00)
NONHDL: 141.86
TRIGLYCERIDES: 262 mg/dL — AB (ref 0.0–149.0)
Total CHOL/HDL Ratio: 6
VLDL: 52.4 mg/dL — ABNORMAL HIGH (ref 0.0–40.0)

## 2015-06-13 LAB — LDL CHOLESTEROL, DIRECT: Direct LDL: 114 mg/dL

## 2015-06-14 LAB — FRUCTOSAMINE: Fructosamine: 312 umol/L — ABNORMAL HIGH (ref 0–285)

## 2015-06-19 ENCOUNTER — Ambulatory Visit (INDEPENDENT_AMBULATORY_CARE_PROVIDER_SITE_OTHER): Payer: BLUE CROSS/BLUE SHIELD | Admitting: Endocrinology

## 2015-06-19 ENCOUNTER — Encounter: Payer: Self-pay | Admitting: Endocrinology

## 2015-06-19 VITALS — BP 132/84 | HR 84 | Ht 71.5 in | Wt 298.0 lb

## 2015-06-19 DIAGNOSIS — E119 Type 2 diabetes mellitus without complications: Secondary | ICD-10-CM | POA: Insufficient documentation

## 2015-06-19 DIAGNOSIS — E1165 Type 2 diabetes mellitus with hyperglycemia: Secondary | ICD-10-CM | POA: Diagnosis not present

## 2015-06-19 NOTE — Progress Notes (Signed)
Patient ID: Troy Randall, male   DOB: December 10, 1960, 55 y.o.   MRN: [id number]           Reason for Appointment: Follow-up for Type 2 Diabetes   History of Present Illness:          Date of diagnosis of type 2 diabetes mellitus: 2013       Background history:   He thinks he was diagnosed to have diabetes in 2015 that he had a glucose of 250 in the hospital in 2013. Last year in 11/15 he was started on metformin and glipizide when he was found to have diabetes.  Did not have any symptoms initially.  Glipizide has been subsequently increased in dosage He has never been monitoring his blood sugar His A1c was as high as 11.2% in 11/15 He  had an A1c  of 7.5 with taking Victoza, Invokana as well as glipizide/metformin as of 1/17  Recent history:   INSULIN: Tresiba 68 units daily Non-insulin hypoglycemic drugs : Glipizide Er 10mg , INVOKANA 300 mg daily    Tresiba insulin was started in 4/17 for marked hyperglycemia His blood sugars were still significantly high in 5/17 when he was not taking Invokana or Victoza He has been able to start Invokana in 5/17  Current blood sugar patterns, management and problems identified:  He is seeing gradual improvement in his blood sugars although still not at target  He has continued to increase his [name] at night by 2 units every 3 days and now is up 28 units compared to his last visit  He does have periodic high readings after meals also but not as consistent  His weight has come down gradually even with increasing his insulin and improved blood sugars  He does not check any readings after meals, still has a few readings over 200 although overall not as high  He was recommended a consultation with the diabetes nurse educator but he has not done so because of cost  Side effects from medications have been: diarrhea from metformin  Compliance with the medical regimen: Fair Hypoglycemia:   never  Glucose monitoring:  done about 1 times a day  on average        Glucometer:  One Touch Blood Glucose readings from download for the last 4 weeks  Mean values apply above for all meters except median for One Touch  PRE-MEAL Fasting Lunch Dinner Bedtime Overall  Glucose range: 129-244  158  105-346  150-340    Mean/median: 187     187     Self-care: The diet that the patient has been following is: tries to limit sweet drinks but not consistently.      Typical meal intake: Breakfast is biscuit , may skip lunch.  Dinner is meat and vegetables at 6-7 pm.   Will have popcorn for snacks.                Dietician visit, most recent: never                Exercise: none, unable to do much because of back pain  Weight history: Previous range 285-315  Wt Readings from Last 3 Encounters:  06/19/15 298 lb (135.172 kg)  05/21/15 301 lb 6.4 oz (136.714 kg)  04/23/15 303 lb 3.2 oz (137.531 kg)    Glycemic control:    Lab Results  Component Value Date   HGBA1C 9.0 04/23/2015   HGBA1C 7.5 01/23/2015   HGBA1C 8.3* 09/26/2014   Lab  Results  Component Value Date   MICROALBUR 1.5 12/12/2014   LDLCALC 115 09/26/2014   CREATININE 0.96 06/13/2015         Medication List       This list is accurate as of: 06/19/15  2:52 PM.  Always use your most recent med list.               amLODipine-benazepril 5-20 MG capsule  Commonly known as:  LOTREL  Take 1 capsule by mouth daily.     atorvastatin 10 MG tablet  Commonly known as:  LIPITOR     canagliflozin 300 MG Tabs tablet  Commonly known as:  INVOKANA  Take 1 tablet (300 mg total) by mouth daily before breakfast.     fenofibrate 160 MG tablet     glipiZIDE 10 MG 24 hr tablet  Commonly known as:  GLUCOTROL XL     glucose blood test strip  Commonly known as:  ONETOUCH VERIO  Use as instructed to check blood sugar twice a day dx code E11.65     ibuprofen 200 MG tablet  Commonly known as:  ADVIL,MOTRIN  Take 400-600 mg by mouth every 6 (six) hours as needed for moderate  pain.     Insulin Glargine 300 UNIT/ML Sopn  Commonly known as:  TOUJEO SOLOSTAR  Inject 30 Units into the skin daily.     Insulin Pen Needle 32G X 5 MM Misc  Commonly known as:  NOVOTWIST  Use two daily to inject Victoza and Toujeo.     montelukast 10 MG tablet  Commonly known as:  SINGULAIR     ONETOUCH DELICA LANCETS 33G Misc  Use to check blood sugar once a day dx code E11.65     sildenafil 100 MG tablet  Commonly known as:  VIAGRA  Take 1 tablet (100 mg total) by mouth daily as needed for erectile dysfunction.     traMADol 50 MG tablet  Commonly known as:  ULTRAM  Take 50 mg by mouth 2 (two) times daily.     VICTOZA 18 MG/3ML Sopn  Generic drug:  Liraglutide  Inject 0.3 mLs (1.8 mg total) into the skin daily. Inject once daily at the same time     zolpidem 10 MG tablet  Commonly known as:  AMBIEN  Reported on 04/23/2015        Allergies:  Allergies  Allergen Reactions  . Penicillins Hives    Past Medical History  Diagnosis Date  . Hypertension   . Arthritis   . Kidney stones   . Anemia     Past Surgical History  Procedure Laterality Date  . Hernia repair      umbilical x1 Incisional x1  . Incisional hernia repair  04/13/2011    Procedure: LAPAROSCOPIC INCISIONAL HERNIA;  Surgeon: Dalia Heading, MD;  Location: [facility];  Service: General;  Laterality: N/A;  Recurrent Laparoscopic Incisional Herniorraphy with Mesh  . Kidney stone surgery    . Ankle surgery      Family History  Problem Relation Age of Onset  . Anesthesia problems Neg Hx   . Hypotension Neg Hx   . Malignant hyperthermia Neg Hx   . Pseudochol deficiency Neg Hx   . Diabetes Father     Social History:  reports that he has never smoked. He uses smokeless tobacco. He reports that he does not drink alcohol or use illicit drugs.    Review of Systems    Lipid history: On Lipitor and fenofibrate  for several months,  His LDL is over 100 and he needs to discuss titration of the Lipitor  with PCP    Lab Results  Component Value Date   CHOL 173 06/13/2015   HDL 31.00* 06/13/2015   LDLCALC 115 09/26/2014   LDLDIRECT 114.0 06/13/2015   TRIG 262.0* 06/13/2015   CHOLHDL 6 06/13/2015            Eyes:   Most recent eye exam was in 2015  Hypertension: On medications for 10+ years, treated by PCP, taking Lotrel    LABS:  Lab on 06/13/2015  Component Date Value Ref Range Status  . Sodium 06/13/2015 141  135 - 145 mEq/L Final  . Potassium 06/13/2015 4.0  3.5 - 5.1 mEq/L Final  . Chloride 06/13/2015 105  96 - 112 mEq/L Final  . CO2 06/13/2015 29  19 - 32 mEq/L Final  . Glucose, Bld 06/13/2015 151* 70 - 99 mg/dL Final  . BUN 16/10/9602 15  6 - 23 mg/dL Final  . Creatinine, Ser 06/13/2015 0.96  0.40 - 1.50 mg/dL Final  . Total Bilirubin 06/13/2015 0.5  0.2 - 1.2 mg/dL Final  . Alkaline Phosphatase 06/13/2015 36* 39 - 117 U/L Final  . AST 06/13/2015 16  0 - 37 U/L Final  . ALT 06/13/2015 19  0 - 53 U/L Final  . Total Protein 06/13/2015 7.2  6.0 - 8.3 g/dL Final  . Albumin 54/09/8117 4.2  3.5 - 5.2 g/dL Final  . Calcium 14/78/2956 9.6  8.4 - 10.5 mg/dL Final  . GFR 21/30/8657 87.17  >60.00 mL/min Final  . Cholesterol 06/13/2015 173  0 - 200 mg/dL Final   ATP III Classification       Desirable:  < 200 mg/dL               Borderline High:  200 - 239 mg/dL          High:  > = 846 mg/dL  . Triglycerides 06/13/2015 262.0* 0.0 - 149.0 mg/dL Final   Normal:  <962 mg/dLBorderline High:  150 - 199 mg/dL  . HDL 06/13/2015 31.00* >39.00 mg/dL Final  . VLDL 95/28/4132 52.4* 0.0 - 40.0 mg/dL Final  . Total CHOL/HDL Ratio 06/13/2015 6   Final                  Men          Women1/2 Average Risk     3.4          3.3Average Risk          5.0          4.42X Average Risk          9.6          7.13X Average Risk          15.0          11.0                      . NonHDL 06/13/2015 141.86   Final   NOTE:  Non-HDL goal should be 30 mg/dL higher than patient's LDL goal (i.e. LDL goal of < 70  mg/dL, would have non-HDL goal of < 100 mg/dL)  . Fructosamine 06/13/2015 312* 0 - 285 umol/L Final   Comment: Published reference interval for apparently healthy subjects between age 51 and 26 is 83 - 285 umol/L and in a poorly controlled diabetic population is 228 - 563 umol/L with a  mean of 396 umol/L.   Marland Kitchen Direct LDL 06/13/2015 114.0   Final   Optimal:  <100 mg/dLNear or Above Optimal:  100-129 mg/dLBorderline High:  130-159 mg/dLHigh:  160-189 mg/dLVery High:  >190 mg/dL    Physical Examination:  BP 132/84 mmHg  Pulse 84  Ht 5' 11.5" (1.816 m)  Wt 298 lb (135.172 kg)  BMI 40.99 kg/m2  SpO2 95%       ASSESSMENT:   Diabetes type 2, uncontrolled with obesity  See history of present illness for detailed discussion of current diabetes management, blood sugar patterns and problems identified  He appears to have required larger doses of insulin to get his blood sugars controlled and they are still not optimal as discussed above Fasting readings are not consistently below 130 Also Has sporadic high readings after meals probably based on diet Still not able to do much exercise He did not have significant postprandial hyperglycemia also previously with using both Invokana and Victoza He also had better control of his appetite with Victoza  He now says that he should be able to get Victoza covered by his insurance and is awaiting delivery of this from mail-order  HYPERTENSION: Blood pressure is Improving with starting Invokana No change in renal function or electrolytes with this  Hyperlipidemia:His LDL is over 100 and he needs to discuss titration of the Lipitor with PCP  PLAN:    Start Victoza when available, need to start with 0.6 mg for the first 2-3 days and then titrate up to 1.8 gradually  Did not Increase insulin at this time and with starting Victoza May possibly need to reduce the dose by 4 units at dinnertime and blood sugars are about 100 or below in the  morning  Consistent diet  More readings after meals  Try to label the after meal readings on the Verio IQ monitor that was given today and shown how to use  Consider stopping glipizide  Continue Invokana 300 mg  Follow-up in 8 weeks  Patient Instructions  REDUCE insulin 4 units every 2-3 days if sugar <100 twice  Check blood sugars on waking up daily Also check blood sugars about 2 hours after a meal and do this after different meals by rotation  Recommended blood sugar levels on waking up is 90-130 and about 2 hours after meal is 130-160  Please bring your blood sugar monitor to each visit, thank you  Start VICTOZA injection daily at the same time of the day.   Dial the dose to 0.6 mg on the pen for the first 2-3 days , final dose 1.8mg    If any questions or concerns are present call the office or the Victoza [facility] helpline at [phone]. Visit [email or web address] for more useful information      Counseling time on subjects discussed above is over 50% of today's 25 minute visit  Dorita Rowlands 06/19/2015, 2:52 PM   Note: This office note was prepared with Dragon voice recognition system technology. Any transcriptional errors that result from this process are unintentional.

## 2015-06-19 NOTE — Patient Instructions (Addendum)
REDUCE insulin 4 units every 2-3 days if sugar <100 twice  Check blood sugars on waking up daily Also check blood sugars about 2 hours after a meal and do this after different meals by rotation  Recommended blood sugar levels on waking up is 90-130 and about 2 hours after meal is 130-160  Please bring your blood sugar monitor to each visit, thank you  Start VICTOZA injection daily at the same time of the day.   Dial the dose to 0.6 mg on the pen for the first 2-3 days , final dose 1.8mg    If any questions or concerns are present call the office or the Victoza [facility] helpline at [phone]. Visit [email or web address] for more useful information

## 2015-06-25 ENCOUNTER — Telehealth: Payer: Self-pay | Admitting: Endocrinology

## 2015-06-25 MED ORDER — LIRAGLUTIDE 18 MG/3ML ~~LOC~~ SOPN: 1.8000 mg | PEN_INJECTOR | Freq: Every day | SUBCUTANEOUS | Status: DC

## 2015-06-25 NOTE — Telephone Encounter (Signed)
Rx submitted per pt's request.  

## 2015-06-25 NOTE — Telephone Encounter (Signed)
Patient need a refill of medication Victoza 90 day supply, send to  [address] [place], [place] - [address] [facility] [phone] (Phone) [phone] (Fax)       Please put on prescription do not fill, they will call the supplier tomorrow

## 2015-07-10 DIAGNOSIS — G8929 Other chronic pain: Secondary | ICD-10-CM | POA: Insufficient documentation

## 2015-07-10 DIAGNOSIS — M5440 Lumbago with sciatica, unspecified side: Secondary | ICD-10-CM

## 2015-08-12 ENCOUNTER — Other Ambulatory Visit: Payer: Self-pay

## 2015-08-12 MED ORDER — INSULIN GLARGINE 300 UNIT/ML ~~LOC~~ SOPN: 40.0000 [IU] | PEN_INJECTOR | Freq: Every day | SUBCUTANEOUS | 2 refills | Status: DC

## 2015-08-13 ENCOUNTER — Other Ambulatory Visit: Payer: Self-pay

## 2015-08-13 MED ORDER — INSULIN GLARGINE 300 UNIT/ML ~~LOC~~ SOPN: 40.0000 [IU] | PEN_INJECTOR | Freq: Every day | SUBCUTANEOUS | 2 refills | Status: DC

## 2015-08-19 ENCOUNTER — Other Ambulatory Visit (INDEPENDENT_AMBULATORY_CARE_PROVIDER_SITE_OTHER): Payer: BLUE CROSS/BLUE SHIELD

## 2015-08-19 DIAGNOSIS — E1165 Type 2 diabetes mellitus with hyperglycemia: Secondary | ICD-10-CM | POA: Diagnosis not present

## 2015-08-19 LAB — BASIC METABOLIC PANEL
BUN: 14 mg/dL (ref 6–23)
CHLORIDE: 105 meq/L (ref 96–112)
CO2: 26 meq/L (ref 19–32)
Calcium: 9.5 mg/dL (ref 8.4–10.5)
Creatinine, Ser: 1.02 mg/dL (ref 0.40–1.50)
GFR: 81.22 mL/min (ref >60.00)
Glucose, Bld: 86 mg/dL (ref 70–99)
POTASSIUM: 4 meq/L (ref 3.5–5.1)
Sodium: 140 mEq/L (ref 135–145)

## 2015-08-19 LAB — HEMOGLOBIN A1C: Hgb A1c MFr Bld: 6.6 % — ABNORMAL HIGH (ref 4.6–6.5)

## 2015-08-22 ENCOUNTER — Ambulatory Visit: Payer: BLUE CROSS/BLUE SHIELD | Admitting: Endocrinology

## 2015-08-26 ENCOUNTER — Encounter: Payer: Self-pay | Admitting: Endocrinology

## 2015-08-26 ENCOUNTER — Ambulatory Visit (INDEPENDENT_AMBULATORY_CARE_PROVIDER_SITE_OTHER): Payer: BLUE CROSS/BLUE SHIELD | Admitting: Endocrinology

## 2015-08-26 VITALS — BP 110/70 | HR 102 | Ht 72.0 in | Wt 300.0 lb

## 2015-08-26 DIAGNOSIS — Z794 Long term (current) use of insulin: Secondary | ICD-10-CM | POA: Diagnosis not present

## 2015-08-26 DIAGNOSIS — G4700 Insomnia, unspecified: Secondary | ICD-10-CM | POA: Insufficient documentation

## 2015-08-26 DIAGNOSIS — E1165 Type 2 diabetes mellitus with hyperglycemia: Secondary | ICD-10-CM

## 2015-08-26 NOTE — Patient Instructions (Addendum)
Check blood sugars on waking up  Every 2 days  Also check blood sugars about 2 hours after a meal and do this after different meals by rotation  Recommended blood sugar levels on waking up is 90-130 and about 2 hours after meal is 130-160  Please bring your blood sugar monitor to each visit, thank you  Hold pen in for 10 sec after shot  45 Toujeo and keep am sugar in range  Stop Glipizide

## 2015-08-26 NOTE — Progress Notes (Signed)
Patient ID: Troy Randall, male   DOB: Jun 27, 1960, 55 y.o.   MRN: [id number]           Reason for Appointment: Follow-up for Type 2 Diabetes   History of Present Illness:          Date of diagnosis of type 2 diabetes mellitus: 2013       Background history:   He thinks he was diagnosed to have diabetes in 2015 that he had a glucose of 250 in the hospital in 2013. Last year in 11/15 he was started on metformin and glipizide when he was found to have diabetes.  Did not have any symptoms initially.  Glipizide has been subsequently increased in dosage He has never been monitoring his blood sugar His A1c was as high as 11.2% in 11/15 He  had an A1c  of 7.5 with taking Victoza, Invokana as well as glipizide/metformin as of 1/17  Recent history:   INSULIN: Toujeo 50-54 units daily Non-insulin hypoglycemic drugs : Glipizide Er 10mg , INVOKANA 300 mg daily, Victoza 1.8mg     Toujeo insulin was started in 4/17 for marked hyperglycemia His blood sugars were still significantly high in 5/17 when he was not taking Invokana or Victoza He has been able to start Invokana in 5/17 and Victoza in 6/17  Current blood sugar patterns, management and problems identified:  He is having much better blood sugar readings especially fasting since starting Victoza and he has titrated this to 1.8 mg; prior to this his blood sugars are periodically in the 300 range  However he thinks he does not have as much satiety with this compared to when he used to take it before He still has not lost any weight  He says that sometimes when he does his Victoza injection some medication comes out of the injection site but he is taking are the pain from the skin right after the plunger is pushed down  His blood sugars are variable after meals, checked at various times in the evening; however blood sugars are relatively good in the last week or so with the highest reading 146 at night  The has cut back on his Toujeo  insulin, was taking 54 and last night took only 50 units and his glucose was only 88 this morning  No hypoglycemia currently  He does not check any readings after meals, still has a few readings over 200 although overall not as high  He was recommended a consultation with the diabetes nurse educator but he has not done so because of cost  Side effects from medications have been: diarrhea from metformin  Compliance with the medical regimen: Fair Hypoglycemia:   never  Glucose monitoring:  done about 1 times a day on average        Glucometer:  One Touch Blood Glucose readings from download for the last 4 weeks  Mean values apply above for all meters except median for One Touch  PRE-MEAL Fasting Lunch Dinner Bedtime Overall  Glucose range: 81-136    128-238    Mean/median: 101    150  109+/-39      Self-care: The diet that the patient has been following is: tries to limit sweet drinks but not consistently.      Typical meal intake: Breakfast is biscuit , may skip lunch.  Dinner is meat and vegetables at 6-7 pm.   Will have popcorn for snacks.  Dietician visit, most recent: never                Exercise: none, unable to do much because of back pain  Weight history: Previous range 285-315  Wt Readings from Last 3 Encounters:  08/26/15 300 lb (136.1 kg)  06/19/15 298 lb (135.2 kg)  05/21/15 (!) 301 lb 6.4 oz (136.7 kg)    Glycemic control:    Lab Results  Component Value Date   HGBA1C 6.6 (H) 08/19/2015   HGBA1C 9.0 04/23/2015   HGBA1C 7.5 01/23/2015   Lab Results  Component Value Date   MICROALBUR 1.5 12/12/2014   LDLCALC 115 09/26/2014   CREATININE 1.02 08/19/2015         Medication List       Accurate as of 08/26/15  3:10 PM. Always use your most recent med list.          amLODipine-benazepril 5-20 MG capsule Commonly known as:  LOTREL Take 1 capsule by mouth daily.   atorvastatin 10 MG tablet Commonly known as:  LIPITOR     canagliflozin 300 MG Tabs tablet Commonly known as:  INVOKANA Take 1 tablet (300 mg total) by mouth daily before breakfast.   fenofibrate 160 MG tablet   glipiZIDE 10 MG 24 hr tablet Commonly known as:  GLUCOTROL XL   glucose blood test strip Commonly known as:  ONETOUCH VERIO Use as instructed to check blood sugar twice a day dx code E11.65   ibuprofen 200 MG tablet Commonly known as:  ADVIL,MOTRIN Take 400-600 mg by mouth every 6 (six) hours as needed for moderate pain.   Insulin Glargine 300 UNIT/ML Sopn Commonly known as:  TOUJEO SOLOSTAR Inject 40 Units into the skin daily.   Insulin Pen Needle 32G X 5 MM Misc Commonly known as:  NOVOTWIST Use two daily to inject Victoza and Toujeo.   Liraglutide 18 MG/3ML Sopn Commonly known as:  VICTOZA Inject 0.3 mLs (1.8 mg total) into the skin daily. Inject once daily at the same time   montelukast 10 MG tablet Commonly known as:  SINGULAIR   ONETOUCH DELICA LANCETS 33G Misc Use to check blood sugar once a day dx code E11.65   sildenafil 100 MG tablet Commonly known as:  VIAGRA Take 1 tablet (100 mg total) by mouth daily as needed for erectile dysfunction.   traMADol 50 MG tablet Commonly known as:  ULTRAM Take 50 mg by mouth 2 (two) times daily.   zolpidem 10 MG tablet Commonly known as:  AMBIEN Reported on 04/23/2015       Allergies:  Allergies  Allergen Reactions  . Penicillins Hives    Past Medical History:  Diagnosis Date  . Anemia   . Arthritis   . Hypertension   . Kidney stones     Past Surgical History:  Procedure Laterality Date  . ANKLE SURGERY    . HERNIA REPAIR     umbilical x1 Incisional x1  . INCISIONAL HERNIA REPAIR  04/13/2011   Procedure: LAPAROSCOPIC INCISIONAL HERNIA;  Surgeon: Dalia Heading, MD;  Location: [facility];  Service: General;  Laterality: N/A;  Recurrent Laparoscopic Incisional Herniorraphy with Mesh  . KIDNEY STONE SURGERY      Family History  Problem Relation Age of  Onset  . Anesthesia problems Neg Hx   . Hypotension Neg Hx   . Malignant hyperthermia Neg Hx   . Pseudochol deficiency Neg Hx   . Diabetes Father     Social History:  reports  that he has never smoked. He uses smokeless tobacco. He reports that he does not drink alcohol or use drugs.    Review of Systems    Lipid history: On Lipitor and fenofibrate for several months,  His LDL is over 100 and He was recommended discussion with the PCP, still taking 10 mg Lipitor    Lab Results  Component Value Date   CHOL 173 06/13/2015   HDL 31.00 (L) 06/13/2015   LDLCALC 115 09/26/2014   LDLDIRECT 114.0 06/13/2015   TRIG 262.0 (H) 06/13/2015   CHOLHDL 6 06/13/2015            Eyes:   Most recent eye exam was in 2015  Hypertension: On medications for 10+ years, treated by PCP, taking Lotrel    LABS:  No visits with results within 1 Week(s) from this visit.  Latest known visit with results is:  Lab on 08/19/2015  Component Date Value Ref Range Status  . Hgb A1c MFr Bld 08/19/2015 6.6* 4.6 - 6.5 % Final  . Sodium 08/19/2015 140  135 - 145 mEq/L Final  . Potassium 08/19/2015 4.0  3.5 - 5.1 mEq/L Final  . Chloride 08/19/2015 105  96 - 112 mEq/L Final  . CO2 08/19/2015 26  19 - 32 mEq/L Final  . Glucose, Bld 08/19/2015 86  70 - 99 mg/dL Final  . BUN 16/10/9602 14  6 - 23 mg/dL Final  . Creatinine, Ser 08/19/2015 1.02  0.40 - 1.50 mg/dL Final  . Calcium 54/09/8117 9.5  8.4 - 10.5 mg/dL Final  . GFR 14/78/2956 81.22  >60.00 mL/min Final    Physical Examination:  BP 110/70   Pulse (!) 102   Ht 6' (1.829 m)   Wt 300 lb (136.1 kg)   SpO2 95%   BMI 40.69 kg/m        ASSESSMENT:   Diabetes type 2, uncontrolled with obesity  See history of present illness for detailed discussion of current diabetes management, blood sugar patterns and problems identified  HeHas had much better blood sugars with adding Victoza and continuing Invokana Has been able to reduce his Toujeo and may  be able to continue doing so Although his postprandial readings are variable they are generally much better with Victoza and does not appear to be needing mealtime insulin He is complaining about the injection process with the Victoza pen  HYPERTENSION: Blood pressure is well controlled now, benefiting from Invokana also  Hyperlipidemia:His LDL is over 100 and he needs to discuss titration of the Lipitor with PCP  He is having some insurance issues with disability, difficulty getting his hypnotics from PCP for sleep  PLAN:    Reduce Toujeo down to 45 units and continue same dose unless blood sugar goes out of range, given titration sheet to help him adjust the dose every 3 days  He will need to try injecting the Victoza in his leg and keep the needle in the skin for 10 seconds after the injection  Advised him on stopping glipizide  Discussed when to check blood sugars and blood sugar targets  Continue Invokana 300 mg  Follow-up in 3 months  Patient Instructions  Check blood sugars on waking up  Every 2 days  Also check blood sugars about 2 hours after a meal and do this after different meals by rotation  Recommended blood sugar levels on waking up is 90-130 and about 2 hours after meal is 130-160  Please bring your blood sugar monitor to each  visit, thank you  Hold pen in for 10 sec after shot  45 Toujeo and keep am sugar in range  Stop Glipizide        Counseling time on subjects discussed above is over 50% of today's 25 minute visit  Troy Randall 08/26/2015, 3:10 PM   Note: This office note was prepared with Insurance underwriterDragon voice recognition system technology. Any transcriptional errors that result from this process are unintentional.

## 2015-10-21 ENCOUNTER — Ambulatory Visit: Payer: BLUE CROSS/BLUE SHIELD | Admitting: Orthopedic Surgery

## 2015-11-19 ENCOUNTER — Encounter: Payer: Self-pay | Admitting: Orthopedic Surgery

## 2015-11-21 ENCOUNTER — Other Ambulatory Visit: Payer: Self-pay

## 2015-11-21 ENCOUNTER — Telehealth: Payer: Self-pay | Admitting: Endocrinology

## 2015-11-21 MED ORDER — CANAGLIFLOZIN 300 MG PO TABS: 300.0000 mg | ORAL_TABLET | Freq: Every day | ORAL | 1 refills | Status: DC

## 2015-11-21 NOTE — Telephone Encounter (Signed)
Patient need a refill of medication canagliflozin (INVOKANA) 300 MG TABS tablet  Sent to  [facility] filled by [employer], [place] - [address]  Phone # [phone] Fax # [phone]

## 2015-11-22 MED ORDER — CANAGLIFLOZIN 300 MG PO TABS: 300.0000 mg | ORAL_TABLET | Freq: Every day | ORAL | 1 refills | Status: DC

## 2015-11-22 NOTE — Telephone Encounter (Signed)
Refill submitted to [facility] through [employer].

## 2015-11-26 ENCOUNTER — Encounter: Payer: Self-pay | Admitting: Endocrinology

## 2015-11-26 ENCOUNTER — Ambulatory Visit (INDEPENDENT_AMBULATORY_CARE_PROVIDER_SITE_OTHER): Payer: BLUE CROSS/BLUE SHIELD | Admitting: Endocrinology

## 2015-11-26 VITALS — BP 118/76 | HR 84 | Temp 97.8°F | Resp 18 | Ht 72.0 in | Wt 304.0 lb

## 2015-11-26 DIAGNOSIS — E1165 Type 2 diabetes mellitus with hyperglycemia: Secondary | ICD-10-CM | POA: Diagnosis not present

## 2015-11-26 DIAGNOSIS — Z794 Long term (current) use of insulin: Secondary | ICD-10-CM

## 2015-11-26 DIAGNOSIS — Z23 Encounter for immunization: Secondary | ICD-10-CM | POA: Diagnosis not present

## 2015-11-26 LAB — COMPREHENSIVE METABOLIC PANEL
ALBUMIN: 4.1 g/dL (ref 3.5–5.2)
ALK PHOS: 35 U/L — AB (ref 39–117)
ALT: 20 U/L (ref 0–53)
AST: 16 U/L (ref 0–37)
BILIRUBIN TOTAL: 0.4 mg/dL (ref 0.2–1.2)
BUN: 15 mg/dL (ref 6–23)
CO2: 28 mEq/L (ref 19–32)
Calcium: 9.6 mg/dL (ref 8.4–10.5)
Chloride: 103 mEq/L (ref 96–112)
Creatinine, Ser: 0.94 mg/dL (ref 0.40–1.50)
GFR: 89.16 mL/min (ref >60.00)
GLUCOSE: 231 mg/dL — AB (ref 70–99)
Potassium: 4.4 mEq/L (ref 3.5–5.1)
Sodium: 138 mEq/L (ref 135–145)
TOTAL PROTEIN: 7.1 g/dL (ref 6.0–8.3)

## 2015-11-26 LAB — MICROALBUMIN / CREATININE URINE RATIO
Creatinine,U: 139.2 mg/dL
MICROALB UR: 2.3 mg/dL — AB (ref 0.0–1.9)
MICROALB/CREAT RATIO: 1.7 mg/g (ref 0.0–30.0)

## 2015-11-26 LAB — POCT GLYCOSYLATED HEMOGLOBIN (HGB A1C): Hemoglobin A1C: 7.6

## 2015-11-26 MED ORDER — SILDENAFIL CITRATE 100 MG PO TABS: 100.0000 mg | ORAL_TABLET | Freq: Every day | ORAL | 0 refills | Status: DC | PRN

## 2015-11-26 MED ORDER — INSULIN ASPART 100 UNIT/ML FLEXPEN: PEN_INJECTOR | SUBCUTANEOUS | 1 refills | Status: DC

## 2015-11-26 NOTE — Progress Notes (Signed)
Patient ID: Troy Randall, male   DOB: Jul 19, 1960, 55 y.o.   MRN: [id number]           Reason for Appointment: Follow-up for Type 2 Diabetes   History of Present Illness:          Date of diagnosis of type 2 diabetes mellitus: 2013       Background history:   He thinks he was diagnosed to have diabetes in 2015 that he had a glucose of 250 in the hospital in 2013. Last year in 11/15 he was started on metformin and glipizide when he was found to have diabetes.  Did not have any symptoms initially.  Glipizide has been subsequently increased in dosage He has never been monitoring his blood sugar His A1c was as high as 11.2% in 11/15 He  had an A1c  of 7.5 with taking Victoza, Invokana as well as glipizide/metformin as of 1/17  Recent history:   INSULIN: Toujeo 54 units daily Non-insulin hypoglycemic drugs : Glipizide Er 10mg , INVOKANA 300 mg daily, Victoza 1.8mg     Toujeo insulin was started in 4/17 for marked hyperglycemia He has been able to start Invokana in 5/17 and Victoza in 6/17  His A1c was down to 6.6 in August but is up to 7.6 now  Current blood sugar patterns, management and problems identified:  He is having much higher blood sugars especially after meals in the last couple of months and he thinks this started after he had an episode of herpes zoster in early September  Fasting readings are mostly higher although averaging about 150  Even with increasing his Toujeo last night to 60 units his fasting reading was still high at 184  He started back on his glipizide couple of weeks ago but this does not appear to be effective  He is still some time eating biscuits and other high-fat foods, occasionally drinking sweet drinks  His weight is up slightly   He was recommended a consultation with the diabetes nurse educator but he has not done so because of cost  Side effects from medications have been: diarrhea from metformin  Compliance with the medical regimen:  Fair Hypoglycemia:   never  Glucose monitoring:  done about 1 times a day on average        Glucometer:  One Touch Blood Glucose readings from download for the last 4 weeks  Mean values apply above for all meters except median for One Touch  PRE-MEAL Fasting Lunch Dinner Bedtime Overall  Glucose range: 123-202   115-202  175-307    Mean/median: 144    189  156      Self-care: The diet that the patient has been following is: tries to limit sweet drinks but not consistently.      Typical meal intake: Breakfast is biscuit , may skip lunch.  Dinner is meat and vegetables at 6-7 pm.   Will have popcorn for snacks.                Dietician visit, most recent: never                Exercise: none, unable to do much because of back pain  Weight history: Previous range 285-315  Wt Readings from Last 3 Encounters:  11/26/15 (!) 304 lb (137.9 kg)  08/26/15 300 lb (136.1 kg)  06/19/15 298 lb (135.2 kg)    Glycemic control:    Lab Results  Component Value Date   HGBA1C 7.6 11/26/2015  HGBA1C 6.6 (H) 08/19/2015   HGBA1C 9.0 04/23/2015   Lab Results  Component Value Date   MICROALBUR 1.5 12/12/2014   LDLCALC 115 09/26/2014   CREATININE 1.02 08/19/2015   Other problems discussed today: See review of systems      Medication List       Accurate as of 11/26/15  1:55 PM. Always use your most recent med list.          amLODipine-benazepril 5-20 MG capsule Commonly known as:  LOTREL Take 1 capsule by mouth daily.   atorvastatin 10 MG tablet Commonly known as:  LIPITOR Take 10 mg by mouth daily at 6 PM.   canagliflozin 300 MG Tabs tablet Commonly known as:  INVOKANA Take 1 tablet (300 mg total) by mouth daily before breakfast.   fenofibrate 160 MG tablet   glipiZIDE 10 MG 24 hr tablet Commonly known as:  GLUCOTROL XL   glucose blood test strip Commonly known as:  ONETOUCH VERIO Use as instructed to check blood sugar twice a day dx code E11.65   ibuprofen 200 MG  tablet Commonly known as:  ADVIL,MOTRIN Take 400-600 mg by mouth every 6 (six) hours as needed for moderate pain.   insulin aspart 100 UNIT/ML FlexPen Commonly known as:  NOVOLOG FLEXPEN 10 Units before meals   Insulin Glargine 300 UNIT/ML Sopn Commonly known as:  TOUJEO SOLOSTAR Inject 40 Units into the skin daily.   Insulin Pen Needle 32G X 5 MM Misc Commonly known as:  NOVOTWIST Use two daily to inject Victoza and Toujeo.   liraglutide 18 MG/3ML Sopn Commonly known as:  VICTOZA Inject 0.3 mLs (1.8 mg total) into the skin daily. Inject once daily at the same time   montelukast 10 MG tablet Commonly known as:  SINGULAIR   ONETOUCH DELICA LANCETS 33G Misc Use to check blood sugar once a day dx code E11.65   sildenafil 100 MG tablet Commonly known as:  VIAGRA Take 1 tablet (100 mg total) by mouth daily as needed for erectile dysfunction.   traMADol 50 MG tablet Commonly known as:  ULTRAM Take 50 mg by mouth 2 (two) times daily.   zolpidem 10 MG tablet Commonly known as:  AMBIEN Reported on 04/23/2015       Allergies:  Allergies  Allergen Reactions  . Penicillins Hives    Past Medical History:  Diagnosis Date  . Anemia   . Arthritis   . Hypertension   . Kidney stones     Past Surgical History:  Procedure Laterality Date  . ANKLE SURGERY    . HERNIA REPAIR     umbilical x1 Incisional x1  . INCISIONAL HERNIA REPAIR  04/13/2011   Procedure: LAPAROSCOPIC INCISIONAL HERNIA;  Surgeon: Dalia Heading, MD;  Location: [facility];  Service: General;  Laterality: N/A;  Recurrent Laparoscopic Incisional Herniorraphy with Mesh  . KIDNEY STONE SURGERY      Family History  Problem Relation Age of Onset  . Anesthesia problems Neg Hx   . Hypotension Neg Hx   . Malignant hyperthermia Neg Hx   . Pseudochol deficiency Neg Hx   . Diabetes Father     Social History:  reports that he has never smoked. He uses smokeless tobacco. He reports that he does not drink alcohol or  use drugs.    Review of Systems    Lipid history: On Lipitor 10 and fenofibrate   His LDL is over 100 and He was recommended discussion with the PCP, still taking  10 mg Lipitor Has not had any follow-up with PCP for lipids    Lab Results  Component Value Date   CHOL 173 06/13/2015   HDL 31.00 (L) 06/13/2015   LDLCALC 115 09/26/2014   LDLDIRECT 114.0 06/13/2015   TRIG 262.0 (H) 06/13/2015   CHOLHDL 6 06/13/2015           He had herpes zoster on his lateral right abdomen and not benefiting from gabapentin He thinks that OTC Biofreeze helps at night  He is asking for a refill on the Viagra even though he does not think it works as well as he likes, does not want to try Cialis  Eyes:   Most recent eye exam was in 2015  Hypertension: On medications for 10+ years, treated by PCP, taking Lotrel    LABS:  Office Visit on 11/26/2015  Component Date Value Ref Range Status  . Hemoglobin A1C 11/26/2015 7.6   Final    Physical Examination:  BP 118/76 (BP Location: Right Arm, Patient Position: Sitting, Cuff Size: Large)   Pulse 84   Temp 97.8 F (36.6 C) (Oral)   Resp 18   Ht 6' (1.829 m)   Wt (!) 304 lb (137.9 kg)   SpO2 98%   BMI 41.23 kg/m        ASSESSMENT:   Diabetes type 2, uncontrolled with obesity  See history of present illness for detailed discussion of current diabetes management, blood sugar patterns and problems identified  Blood sugars are again out of control He thinks this is from having herpes zoster even though he has not had any steroids Fasting blood sugars are trending higher but his highest blood sugars are after meals which he does sometimes in the evening Also has not checked enough readings after his first meal which can be at variable times Still not getting postprandial control with continuing Invokana and Victoza Also he can do better on diet, again refuses to see the dietitian because of cost  HYPERTENSION: Blood pressure is well  controlled   Hyperlipidemia:His LDL is over 100 and he needs to good fasting labs on the next visit  Erectile dysfunction from diabetes: Viagra 100 mg refilled    PLAN:    Today discussed in detail the need for mealtime insulin to cover postprandial spikes, action of mealtime insulin, use of the insulin pen, timing and action of the rapid acting insulin as well as starting dose and dosage titration to target the two-hour reading of under 180  He will start with 10 units for his first meal and 14 for his evening meal  Given information on Novolog  Increased Toujeo to 60 units consistently unless fasting blood sugars did not stain range after a week or s  Advised him on stopping glipizide as this is ineffective  Discussed when to check blood sugars and blood sugar targets  Follow-up in 6 weeks  Try OTC Lidoderm  Patient Instructions  Stop Glipizide  Humalog 10 units at Bfst/Lunch and 14 at supper  Stay on 60 units Toujeo  Lidoderm patch on site of pain  Check blood sugars on waking up  4x weekly  Also check blood sugars about 2 hours after a meal and do this after different meals by rotation  Recommended blood sugar levels on waking up is 90-130 and about 2 hours after meal is 130-160  Please bring your blood sugar monitor to each visit, thank you     Counseling time on subjects discussed above is  over 50% of today's 25 minute visit  Nissa Stannard 11/26/2015, 1:55 PM   Note: This office note was prepared with Insurance underwriterDragon voice recognition system technology. Any transcriptional errors that result from this process are unintentional.

## 2015-11-26 NOTE — Patient Instructions (Addendum)
Stop Glipizide  Humalog 10 units at Bfst/Lunch and 14 at supper  Stay on 60 units Toujeo  Lidoderm patch on site of pain  Check blood sugars on waking up  4x weekly  Also check blood sugars about 2 hours after a meal and do this after different meals by rotation  Recommended blood sugar levels on waking up is 90-130 and about 2 hours after meal is 130-160  Please bring your blood sugar monitor to each visit, thank you

## 2016-01-07 ENCOUNTER — Other Ambulatory Visit: Payer: Self-pay | Admitting: Endocrinology

## 2016-01-16 ENCOUNTER — Ambulatory Visit: Payer: BLUE CROSS/BLUE SHIELD | Admitting: Endocrinology

## 2016-01-21 ENCOUNTER — Other Ambulatory Visit: Payer: Self-pay

## 2016-01-21 ENCOUNTER — Ambulatory Visit (INDEPENDENT_AMBULATORY_CARE_PROVIDER_SITE_OTHER): Payer: 59 | Admitting: Endocrinology

## 2016-01-21 ENCOUNTER — Encounter: Payer: Self-pay | Admitting: Endocrinology

## 2016-01-21 VITALS — BP 132/88 | HR 81 | Ht 72.0 in | Wt 305.0 lb

## 2016-01-21 DIAGNOSIS — Z794 Long term (current) use of insulin: Secondary | ICD-10-CM | POA: Diagnosis not present

## 2016-01-21 DIAGNOSIS — E782 Mixed hyperlipidemia: Secondary | ICD-10-CM | POA: Diagnosis not present

## 2016-01-21 DIAGNOSIS — E1165 Type 2 diabetes mellitus with hyperglycemia: Secondary | ICD-10-CM

## 2016-01-21 DIAGNOSIS — I10 Essential (primary) hypertension: Secondary | ICD-10-CM | POA: Diagnosis not present

## 2016-01-21 LAB — COMPREHENSIVE METABOLIC PANEL
ALBUMIN: 4.2 g/dL (ref 3.5–5.2)
ALK PHOS: 52 U/L (ref 39–117)
ALT: 23 U/L (ref 0–53)
AST: 15 U/L (ref 0–37)
BUN: 10 mg/dL (ref 6–23)
CO2: 27 mEq/L (ref 19–32)
CREATININE: 0.81 mg/dL (ref 0.40–1.50)
Calcium: 9.4 mg/dL (ref 8.4–10.5)
Chloride: 103 mEq/L (ref 96–112)
GFR: 105.81 mL/min (ref >60.00)
Glucose, Bld: 191 mg/dL — ABNORMAL HIGH (ref 70–99)
Potassium: 4.1 mEq/L (ref 3.5–5.1)
SODIUM: 139 meq/L (ref 135–145)
TOTAL PROTEIN: 7.4 g/dL (ref 6.0–8.3)
Total Bilirubin: 0.5 mg/dL (ref 0.2–1.2)

## 2016-01-21 LAB — LIPID PANEL
CHOL/HDL RATIO: 5
CHOLESTEROL: 172 mg/dL (ref 0–200)
HDL: 36.3 mg/dL — ABNORMAL LOW (ref >39.00)
NonHDL: 135.42
Triglycerides: 292 mg/dL — ABNORMAL HIGH (ref 0.0–149.0)
VLDL: 58.4 mg/dL — ABNORMAL HIGH (ref 0.0–40.0)

## 2016-01-21 LAB — LDL CHOLESTEROL, DIRECT: Direct LDL: 101 mg/dL

## 2016-01-21 MED ORDER — INSULIN GLARGINE 300 UNIT/ML ~~LOC~~ SOPN: 60.0000 [IU] | PEN_INJECTOR | Freq: Every day | SUBCUTANEOUS | 4 refills | Status: DC

## 2016-01-21 MED ORDER — LIRAGLUTIDE 18 MG/3ML ~~LOC~~ SOPN: 1.8000 mg | PEN_INJECTOR | Freq: Every day | SUBCUTANEOUS | 1 refills | Status: DC

## 2016-01-21 MED ORDER — INSULIN ASPART 100 UNIT/ML FLEXPEN: PEN_INJECTOR | SUBCUTANEOUS | 1 refills | Status: DC

## 2016-01-21 MED ORDER — SILDENAFIL CITRATE 100 MG PO TABS: 100.0000 mg | ORAL_TABLET | Freq: Every day | ORAL | 1 refills | Status: DC | PRN

## 2016-01-21 NOTE — Progress Notes (Signed)
Patient ID: Troy Randall, male   DOB: 1960/12/16, 56 y.o.   MRN: [id number]           Reason for Appointment: Follow-up for Type 2 Diabetes   History of Present Illness:          Date of diagnosis of type 2 diabetes mellitus: 2013       Background history:   He thinks he was diagnosed to have diabetes in 2015 that he had a glucose of 250 in the hospital in 2013. Last year in 11/15 he was started on metformin and glipizide when he was found to have diabetes.  Did not have any symptoms initially.  Glipizide has been subsequently increased in dosage He has never been monitoring his blood sugar His A1c was as high as 11.2% in 11/15 He  had an A1c  of 7.5 with taking Victoza, Invokana as well as glipizide/metformin as of 1/17  Toujeo insulin was started in 4/17 for marked hyperglycemia He has been able to start Invokana in 5/17 and Victoza in 6/17  Recent history:   INSULIN: Toujeo 60 units pm Non-insulin hypoglycemic drugs : Glipizide Er 10mg , INVOKANA 300 mg daily  Not taking Victoza 1.8mg  currently    His A1c was last 7.6  Current blood sugar patterns, management and problems identified:  He had some difficulty getting his NovoLog covered at the pharmacy that the prescription was sent to and he did not start this.  Also did not let us know that he could not get the Novolog until today  Also he has not taken Victoza since his last visit because of the cost and also did not inform us about this  Still having higher POSTPRANDIAL blood sugars, both after lunch and supper  He thinks his blood sugars are better when he does not eat any lunch  He thinks he knows what to do with his diet but does not do it  He has not changed his Toujeo a window fasting readings are still relatively higher  His blood sugars are being monitored more before meals but at times he has readings after supper which are as high as 364  Fasting readings are mostly higher, previously averaging 150 and  now about 172  Not able to lose weight  He was recommended a consultation with the diabetes nurse educator or dietitian but he has not done so because of cost  Side effects from medications have been: diarrhea from metformin  Compliance with the medical regimen: Fair Hypoglycemia:   never  Glucose monitoring:  done about 1 times a day on average        Glucometer:  One Touch Blood Glucose readings from download for the last 4 weeks  Mean values apply above for all meters except median for One Touch  PRE-MEAL Fasting Lunch Dinner Bedtime Overall  Glucose range: 134-257   107-364  137-355    Mean/median: 170   187  266  184     Self-care: The diet that the patient has been following is: tries to limit sweet drinks but not consistently.      Typical meal intake: Breakfast is biscuit , may skip lunch.  Dinner is meat and vegetables at 6-7 pm.   Will have popcorn for snacks.                Dietician visit, most recent: never                Exercise: none, unable to  do much because of back pain  Weight history: Previous range 285-315  Wt Readings from Last 3 Encounters:  01/21/16 (!) 305 lb (138.3 kg)  11/26/15 (!) 304 lb (137.9 kg)  08/26/15 300 lb (136.1 kg)    Glycemic control:    Lab Results  Component Value Date   HGBA1C 7.6 11/26/2015   HGBA1C 6.6 (H) 08/19/2015   HGBA1C 9.0 04/23/2015   Lab Results  Component Value Date   MICROALBUR 2.3 (H) 11/26/2015   LDLCALC 115 09/26/2014   CREATININE 0.94 11/26/2015   Other problems discussed today: See review of systems    Allergies as of 01/21/2016      Reactions   Penicillins Hives      Medication List       Accurate as of 01/21/16  1:03 PM. Always use your most recent med list.          amLODipine-benazepril 5-20 MG capsule Commonly known as:  LOTREL Take 1 capsule by mouth daily.   atorvastatin 10 MG tablet Commonly known as:  LIPITOR Take 10 mg by mouth daily at 6 PM.   canagliflozin 300 MG Tabs  tablet Commonly known as:  INVOKANA Take 1 tablet (300 mg total) by mouth daily before breakfast.   fenofibrate 160 MG tablet   glipiZIDE 10 MG 24 hr tablet Commonly known as:  GLUCOTROL XL   ibuprofen 200 MG tablet Commonly known as:  ADVIL,MOTRIN Take 400-600 mg by mouth every 6 (six) hours as needed for moderate pain.   insulin aspart 100 UNIT/ML FlexPen Commonly known as:  NOVOLOG FLEXPEN 10 Units before meals   Insulin Glargine 300 UNIT/ML Sopn Commonly known as:  TOUJEO SOLOSTAR Inject 60 Units into the skin daily. Pt states he is taking 54-60 units   Insulin Pen Needle 32G X 5 MM Misc Commonly known as:  NOVOTWIST Use two daily to inject Victoza and Toujeo.   liraglutide 18 MG/3ML Sopn Commonly known as:  VICTOZA Inject 0.3 mLs (1.8 mg total) into the skin daily. Inject once daily at the same time   montelukast 10 MG tablet Commonly known as:  SINGULAIR   ONETOUCH DELICA LANCETS 33G Misc Use to check blood sugar once a day dx code E11.65   ONETOUCH VERIO test strip Generic drug:  glucose blood TEST twice a day   sildenafil 100 MG tablet Commonly known as:  VIAGRA Take 1 tablet (100 mg total) by mouth daily as needed for erectile dysfunction.   traMADol 50 MG tablet Commonly known as:  ULTRAM Take 50 mg by mouth 2 (two) times daily.   zolpidem 10 MG tablet Commonly known as:  AMBIEN Reported on 04/23/2015       Allergies:  Allergies  Allergen Reactions  . Penicillins Hives    Past Medical History:  Diagnosis Date  . Anemia   . Arthritis   . Hypertension   . Kidney stones     Past Surgical History:  Procedure Laterality Date  . ANKLE SURGERY    . HERNIA REPAIR     umbilical x1 Incisional x1  . INCISIONAL HERNIA REPAIR  04/13/2011   Procedure: LAPAROSCOPIC INCISIONAL HERNIA;  Surgeon: Dalia Heading, MD;  Location: [facility];  Service: General;  Laterality: N/A;  Recurrent Laparoscopic Incisional Herniorraphy with Mesh  . KIDNEY STONE SURGERY       Family History  Problem Relation Age of Onset  . Anesthesia problems Neg Hx   . Hypotension Neg Hx   . Malignant hyperthermia  Neg Hx   . Pseudochol deficiency Neg Hx   . Diabetes Father     Social History:  reports that he has never smoked. He uses smokeless tobacco. He reports that he does not drink alcohol or use drugs.    Review of Systems   He has had more stress recently with family issues   Lipid history: On Lipitor 10 and fenofibrate   His LDL is over 100 and is still taking 10 mg Lipitor Has not had any follow-up with PCP for lipids    Lab Results  Component Value Date   CHOL 173 06/13/2015   HDL 31.00 (L) 06/13/2015   LDLCALC 115 09/26/2014   LDLDIRECT 114.0 06/13/2015   TRIG 262.0 (H) 06/13/2015   CHOLHDL 6 06/13/2015            He is asking for a refill on the Viagra Again  Eyes:   Most recent eye exam was in 2015  Hypertension: On medications for 10+ years, treated by PCP, taking Lotrel, Blood pressure is high normal today which he thinks is from stress    LABS:  No visits with results within 1 Week(s) from this visit.  Latest known visit with results is:  Office Visit on 11/26/2015  Component Date Value Ref Range Status  . Hemoglobin A1C 11/26/2015 7.6   Final  . Sodium 11/26/2015 138  135 - 145 mEq/L Final  . Potassium 11/26/2015 4.4  3.5 - 5.1 mEq/L Final  . Chloride 11/26/2015 103  96 - 112 mEq/L Final  . CO2 11/26/2015 28  19 - 32 mEq/L Final  . Glucose, Bld 11/26/2015 231* 70 - 99 mg/dL Final  . BUN 40/98/1191 15  6 - 23 mg/dL Final  . Creatinine, Ser 11/26/2015 0.94  0.40 - 1.50 mg/dL Final  . Total Bilirubin 11/26/2015 0.4  0.2 - 1.2 mg/dL Final  . Alkaline Phosphatase 11/26/2015 35* 39 - 117 U/L Final  . AST 11/26/2015 16  0 - 37 U/L Final  . ALT 11/26/2015 20  0 - 53 U/L Final  . Total Protein 11/26/2015 7.1  6.0 - 8.3 g/dL Final  . Albumin 47/82/9562 4.1  3.5 - 5.2 g/dL Final  . Calcium 13/08/6576 9.6  8.4 - 10.5 mg/dL Final    . GFR 46/96/2952 89.16  >60.00 mL/min Final  . Microalb, Ur 11/26/2015 2.3* 0.0 - 1.9 mg/dL Final  . Creatinine,U 84/13/2440 139.2  mg/dL Final  . Microalb Creat Ratio 11/26/2015 1.7  0.0 - 30.0 mg/g Final    Physical Examination:  BP 132/88   Pulse 81   Ht 6' (1.829 m)   Wt (!) 305 lb (138.3 kg)   SpO2 97%   BMI 41.37 kg/m        ASSESSMENT:   Diabetes type 2, uncontrolled with obesity  See history of present illness for detailed discussion of current diabetes management, blood sugar patterns and problems identified  Blood sugars are again out of control He is not able to afford Victoza He did not start NovoLog as directed because of some insurance issues with the pharmacy and he did not report this and ask for a different pharmacy Blood sugars maybe over 300 at times after meals Fasting readings are also high Some of his high readings may be from stopping Victoza  He is now asking about less expensive alternatives when he goes on medication about a month or so He can do significantly better with diet and try to lose weight, has not  been able to do much even with Victoza and Invokana   HYPERTENSION: Blood pressure is relatively higher today  Hyperlipidemia:His LDL is over 100 and he needs to fasting labs today  Erectile dysfunction from diabetes: Viagra 100 mg refilled    PLAN:    He thinks he can get Victoza again and this should help with his control and some reduction in insulin dosage and hopefully weight also  Try checking blood sugars more after meals to help identify foods that are raising the blood sugar and efficacy of his mealtime insulin  Novolog sent to the preferred pharmacy and he will try 15 units with every meal for now  Needs diabetes education with nurse educator and dietitian  New One Touch Verio given, he is having some issues with the previous for  For now increase Toujeo to 64.  He can use Lantus and less expensive on Medicare and  probably will need to use this twice a day  Explained that the Victoza has no other expensive alternatives  Recommend continuing Invokana for multiple benefits  For mealtime insulin he can get the [employer] brand Regular Insulin using a syringe when he needs to  New prescriptions sent as requested  Patient Instructions  Check blood sugars on waking up 3-4x per week   Also check blood sugars about 2 hours after a meal and do this after different meals by rotation  Recommended blood sugar levels on waking up is 90-130 and about 2 hours after meal is 130-160  Please bring your blood sugar monitor to each visit, thank you  Start watching diet  Toujeo 64 units daily (Check alternatives)  Novolog 15 before meals  Check [employer] brands    Counseling time on subjects discussed above is over 50% of today's 25 minute visit  Lolly Glaus 01/21/2016, 1:03 PM   Note: This office note was prepared with Insurance underwriter. Any transcriptional errors that result from this process are unintentional.

## 2016-01-21 NOTE — Patient Instructions (Signed)
Check blood sugars on waking up 3-4x per week   Also check blood sugars about 2 hours after a meal and do this after different meals by rotation  Recommended blood sugar levels on waking up is 90-130 and about 2 hours after meal is 130-160  Please bring your blood sugar monitor to each visit, thank you  Start watching diet  Toujeo 64 units daily (Check alternatives)  Novolog 15 before meals  Check [employer] brands

## 2016-01-28 ENCOUNTER — Other Ambulatory Visit: Payer: Self-pay

## 2016-01-28 MED ORDER — INSULIN DEGLUDEC 200 UNIT/ML ~~LOC~~ SOPN: 60.0000 [IU] | PEN_INJECTOR | Freq: Every day | SUBCUTANEOUS | 3 refills | Status: DC

## 2016-01-30 ENCOUNTER — Telehealth: Payer: Self-pay | Admitting: Endocrinology

## 2016-01-30 NOTE — Telephone Encounter (Signed)
Pt's wife called back in to check on the status of the PA or if Dr. Lucianne Muss was going to change the medication, she would like a call back.

## 2016-02-05 ENCOUNTER — Other Ambulatory Visit: Payer: Self-pay

## 2016-02-05 MED ORDER — INSULIN DEGLUDEC 200 UNIT/ML ~~LOC~~ SOPN: 60.0000 [IU] | PEN_INJECTOR | Freq: Every day | SUBCUTANEOUS | 3 refills | Status: DC

## 2016-02-10 DIAGNOSIS — R69 Illness, unspecified: Secondary | ICD-10-CM | POA: Diagnosis not present

## 2016-02-17 NOTE — Telephone Encounter (Signed)
I contacted the patient's wife. The message from 01/30/2016 was about the Toujeo. Since this message Patient has been switched to [name] and the patient's wife had no further questions at this time.

## 2016-03-02 ENCOUNTER — Telehealth: Payer: Self-pay | Admitting: Endocrinology

## 2016-03-02 ENCOUNTER — Encounter: Payer: Self-pay | Admitting: Endocrinology

## 2016-03-02 ENCOUNTER — Other Ambulatory Visit: Payer: Self-pay

## 2016-03-02 MED ORDER — CANAGLIFLOZIN 300 MG PO TABS: 300.0000 mg | ORAL_TABLET | Freq: Every day | ORAL | 1 refills | Status: DC

## 2016-03-02 NOTE — Telephone Encounter (Signed)
Ordered 03/02/16

## 2016-03-02 NOTE — Telephone Encounter (Signed)
Pt needs refill on canagliflozin (INVOKANA) 300 MG TABS tablet and needs rx sent to [facility] in [place] on [address].

## 2016-03-08 DIAGNOSIS — R69 Illness, unspecified: Secondary | ICD-10-CM | POA: Diagnosis not present

## 2016-03-20 ENCOUNTER — Ambulatory Visit (INDEPENDENT_AMBULATORY_CARE_PROVIDER_SITE_OTHER): Payer: Medicare HMO | Admitting: Endocrinology

## 2016-03-20 ENCOUNTER — Encounter: Payer: Self-pay | Admitting: Endocrinology

## 2016-03-20 VITALS — BP 116/80 | HR 80 | Ht 72.0 in | Wt 295.0 lb

## 2016-03-20 DIAGNOSIS — E1165 Type 2 diabetes mellitus with hyperglycemia: Secondary | ICD-10-CM | POA: Diagnosis not present

## 2016-03-20 DIAGNOSIS — I10 Essential (primary) hypertension: Secondary | ICD-10-CM

## 2016-03-20 DIAGNOSIS — Z794 Long term (current) use of insulin: Secondary | ICD-10-CM

## 2016-03-20 LAB — POCT GLYCOSYLATED HEMOGLOBIN (HGB A1C): Hemoglobin A1C: 6.5

## 2016-03-20 NOTE — Patient Instructions (Addendum)
Tresiba 54 units daily  Check combo pen Xultophy  Take Novolog before each meal

## 2016-03-20 NOTE — Progress Notes (Signed)
Patient ID: Troy Randall, male   DOB: 1960/05/03, 56 y.o.   MRN: [id number]           Reason for Appointment: Follow-up for Type 2 Diabetes   History of Present Illness:          Date of diagnosis of type 2 diabetes mellitus: 2013       Background history:   He thinks he was diagnosed to have diabetes in 2015 that he had a glucose of 250 in the hospital in 2013. Last year in 11/15 he was started on metformin and glipizide when he was found to have diabetes.  Did not have any symptoms initially.  Glipizide has been subsequently increased in dosage He has never been monitoring his blood sugar His A1c was as high as 11.2% in 11/15 He  had an A1c  of 7.5 with taking Victoza, Invokana as well as glipizide/metformin as of 1/17  Toujeo insulin was started in 4/17 for marked hyperglycemia He has been able to start Invokana in 5/17 and Victoza in 6/17  Recent history:   INSULIN: Tresiba 50-60 units in pm, NovoLog 10 units before meals  Non-insulin hypoglycemic drugs : Glipizide Er 10mg , INVOKANA 300 mg daily, Victoza 1.8mg      His A1c was last 7.6 and now this is back down to 6.5  Current blood sugar patterns, management and problems identified:   On his previous visit his blood sugars were averaging over 200 and sometimes over 300 nonfasting.  He has been started back on Victoza since then and also his Toujeo was changed to [name]  With this his blood sugars are overall dramatically better overall checking more in the mornings then later in the day  Still having higher POSTPRANDIAL blood sugars at times with a few readings over 200 probably related to inconsistent diet.  He is trying to be consistent with taking his mealtime insulin with food but may occasionally forget  Now able to lose weight, also trying to cut back on portions especially with starting Victoza.  He has however adjusted his TRESIBA based on his blood sugars at night instead of fasting blood sugar patterns and  will have some fluctuation in the morning sugars  HIGHEST blood sugars are overall late afternoon, may not be taking any Novolog at lunch especially if he does not eat a proper meal  He was recommended a consultation with the diabetes nurse educator or dietitian but he has not done so because of cost  Side effects from medications have been: diarrhea from metformin  Compliance with the medical regimen: Fair Hypoglycemia:   never  Glucose monitoring:  done about 1 times a day on average        Glucometer:  One Touch Verio Blood Glucose readings from download for the last 4 weeks   Mean values apply above for all meters except median for One Touch  PRE-MEAL Fasting Lunch Dinner Bedtime Overall  Glucose range: 89-182   102-224  90-247    Mean/median: 131   165  120  132      Self-care: The diet that the patient has been following is: tries to limit sweet drinks but not consistently.      Typical meal intake: Breakfast is sometimes biscuit , may skip lunch.  Dinner is meat and vegetables at 6-7 pm.   Will have popcorn for snacks.                Dietician visit, most recent: never  Exercise: none, unable to do much because of back pain  Weight history: Previous range 285-315  Wt Readings from Last 3 Encounters:  03/20/16 295 lb (133.8 kg)  01/21/16 (!) 305 lb (138.3 kg)  11/26/15 (!) 304 lb (137.9 kg)    Glycemic control:    Lab Results  Component Value Date   HGBA1C 6.5 03/20/2016   HGBA1C 7.6 11/26/2015   HGBA1C 6.6 (H) 08/19/2015   Lab Results  Component Value Date   MICROALBUR 2.3 (H) 11/26/2015   LDLCALC 115 09/26/2014   CREATININE 0.81 01/21/2016    Lab Results  Component Value Date   FRUCTOSAMINE 312 (H) 06/13/2015   FRUCTOSAMINE 279 12/12/2014    Other problems discussed today: See review of systems    Allergies as of 03/20/2016      Reactions   Penicillins Hives      Medication List       Accurate as of 03/20/16 11:59 PM. Always  use your most recent med list.          amLODipine-benazepril 5-20 MG capsule Commonly known as:  LOTREL Take 1 capsule by mouth daily.   atorvastatin 10 MG tablet Commonly known as:  LIPITOR Take 10 mg by mouth daily at 6 PM.   canagliflozin 300 MG Tabs tablet Commonly known as:  INVOKANA Take 1 tablet (300 mg total) by mouth daily before breakfast.   fenofibrate 160 MG tablet   glipiZIDE 10 MG 24 hr tablet Commonly known as:  GLUCOTROL XL   ibuprofen 200 MG tablet Commonly known as:  ADVIL,MOTRIN Take 400-600 mg by mouth every 6 (six) hours as needed for moderate pain.   insulin aspart 100 UNIT/ML FlexPen Commonly known as:  NOVOLOG FLEXPEN 10 Units before meals   Insulin Degludec 200 UNIT/ML Sopn Commonly known as:  TRESIBA FLEXTOUCH Inject 60 Units into the skin daily.   Insulin Pen Needle 32G X 5 MM Misc Commonly known as:  NOVOTWIST Use two daily to inject Victoza and Toujeo.   liraglutide 18 MG/3ML Sopn Commonly known as:  VICTOZA Inject 0.3 mLs (1.8 mg total) into the skin daily. Inject once daily at the same time   montelukast 10 MG tablet Commonly known as:  SINGULAIR   ONETOUCH DELICA LANCETS 33G Misc Use to check blood sugar once a day dx code E11.65   ONETOUCH VERIO test strip Generic drug:  glucose blood TEST twice a day   sildenafil 100 MG tablet Commonly known as:  VIAGRA Take 1 tablet (100 mg total) by mouth daily as needed for erectile dysfunction.   traMADol 50 MG tablet Commonly known as:  ULTRAM Take 50 mg by mouth 2 (two) times daily.   zolpidem 10 MG tablet Commonly known as:  AMBIEN Reported on 04/23/2015       Allergies:  Allergies  Allergen Reactions  . Penicillins Hives    Past Medical History:  Diagnosis Date  . Anemia   . Arthritis   . Hypertension   . Kidney stones     Past Surgical History:  Procedure Laterality Date  . ANKLE SURGERY    . HERNIA REPAIR     umbilical x1 Incisional x1  . INCISIONAL  HERNIA REPAIR  04/13/2011   Procedure: LAPAROSCOPIC INCISIONAL HERNIA;  Surgeon: Dalia Heading, MD;  Location: [facility];  Service: General;  Laterality: N/A;  Recurrent Laparoscopic Incisional Herniorraphy with Mesh  . KIDNEY STONE SURGERY      Family History  Problem Relation Age of Onset  .  Anesthesia problems Neg Hx   . Hypotension Neg Hx   . Malignant hyperthermia Neg Hx   . Pseudochol deficiency Neg Hx   . Diabetes Father     Social History:  reports that he has never smoked. He uses smokeless tobacco. He reports that he does not drink alcohol or use drugs.    Review of Systems     Lipid history: On Lipitor 10 and fenofibrate   His LDL is Just over 100 and is taking 10 mg Lipitor from his PCP Triglycerides were high but his sugars were high also at that time   Lab Results  Component Value Date   CHOL 172 01/21/2016   HDL 36.30 (L) 01/21/2016   LDLCALC 115 09/26/2014   LDLDIRECT 101.0 01/21/2016   TRIG 292.0 (H) 01/21/2016   CHOLHDL 5 01/21/2016           Eyes:   Most recent eye exam was in 2015  Hypertension: On medications for 10+ years, treated by PCP, taking Lotrel, Blood pressure is Controlled   LABS:  Office Visit on 03/20/2016  Component Date Value Ref Range Status  . Hemoglobin A1C 03/20/2016 6.5   Final    Physical Examination:  BP 116/80   Pulse 80   Ht 6' (1.829 m)   Wt 295 lb (133.8 kg)   SpO2 95%   BMI 40.01 kg/m        ASSESSMENT:   Diabetes type 2, uncontrolled with obesity  See history of present illness for detailed discussion of current diabetes management, blood sugar patterns and problems identified  Blood sugars are finally getting back to reasonable control with his multidrug regimen of Invokana, glipizide, Victoza and basal bolus insulin A1c 6.5  He also is probably doing better with using [name] compared to [facility] [place] However he is adjusting this based on his nighttime readings since her of fasting patterns Also he does not  always cover his meals with Novolog, not also adjusting the dose based on carbohydrate intake He is doing better with his diet and is finally losing weight  Eye exams: He is needing to get a follow-up exam done, report to be sent when this is done, encouraged him to keep up with this regularly at least every 2 years  HYPERTENSION: Blood pressure is relatively better today, may be benefiting from weight loss and improved diet    PLAN:    He will consider Xultophy if this is covered for lower co-pay  He will be more consistent taking NovoLog before each meal  Not clear if he would respond to the V-go pump because of relatively high basal requirement currently  Discussed adjusting the TRESIBA only on a weekly basis if fasting readings are of control otherwise he can try taking 54 units consistently.  Discussed balancing meals with some protein consistently and avoiding high-fat/high carbohydrate food  Consider stopping glipizide as unlikely to be benefiting from this; not able to take metformin because of diarrhea previously  Follow-up in 3 months  Patient Instructions  Tresiba 54 units daily  Check combo pen Xultophy  Take Novolog before each meal   Counseling time on subjects discussed above is over 50% of today's 25 minute visit  Alasdair Kleve 03/22/2016, 6:46 PM   Note: This office note was prepared with Dragon voice recognition system technology. Any transcriptional errors that result from this process are unintentional.

## 2016-03-25 ENCOUNTER — Other Ambulatory Visit: Payer: Self-pay | Admitting: Endocrinology

## 2016-03-31 DIAGNOSIS — R69 Illness, unspecified: Secondary | ICD-10-CM | POA: Diagnosis not present

## 2016-04-26 DIAGNOSIS — R69 Illness, unspecified: Secondary | ICD-10-CM | POA: Diagnosis not present

## 2016-04-29 DIAGNOSIS — M7541 Impingement syndrome of right shoulder: Secondary | ICD-10-CM | POA: Diagnosis not present

## 2016-04-29 DIAGNOSIS — M7542 Impingement syndrome of left shoulder: Secondary | ICD-10-CM | POA: Diagnosis not present

## 2016-04-30 ENCOUNTER — Other Ambulatory Visit: Payer: Self-pay

## 2016-04-30 MED ORDER — INSULIN DEGLUDEC 200 UNIT/ML ~~LOC~~ SOPN: 60.0000 [IU] | PEN_INJECTOR | Freq: Every day | SUBCUTANEOUS | 3 refills | Status: DC

## 2016-05-01 ENCOUNTER — Other Ambulatory Visit: Payer: Self-pay

## 2016-05-01 MED ORDER — INSULIN DEGLUDEC 200 UNIT/ML ~~LOC~~ SOPN: 60.0000 [IU] | PEN_INJECTOR | Freq: Every day | SUBCUTANEOUS | 3 refills | Status: DC

## 2016-05-04 ENCOUNTER — Other Ambulatory Visit: Payer: Self-pay | Admitting: Endocrinology

## 2016-05-22 DIAGNOSIS — R69 Illness, unspecified: Secondary | ICD-10-CM | POA: Diagnosis not present

## 2016-05-26 ENCOUNTER — Telehealth: Payer: Self-pay | Admitting: Endocrinology

## 2016-05-26 NOTE — Telephone Encounter (Signed)
Troy Randall Wife [phone]  [name] [phone]  [name] called to see if it would be okay for Troy Randall to change his insulin temporary to Levmir U-100, 100 units ml or Humalog U-100. 100 units ml. His morther-in-law past away and she has a lot of these insulins left over that she had gotten free exp dates 2020. They would like to use these instead of buying new until they run out then go back to what he was using. Please call and advise

## 2016-05-27 DIAGNOSIS — M25512 Pain in left shoulder: Secondary | ICD-10-CM | POA: Diagnosis not present

## 2016-05-27 NOTE — Telephone Encounter (Signed)
I contacted the patient and advised of message via voicemail. 

## 2016-05-27 NOTE — Telephone Encounter (Signed)
Okay If he is going to use Levemir instead of [name] he can take 30 units twice a day He can use Humalog at the same doses of NovoLog

## 2016-06-08 DIAGNOSIS — M25512 Pain in left shoulder: Secondary | ICD-10-CM | POA: Diagnosis not present

## 2016-06-15 DIAGNOSIS — R69 Illness, unspecified: Secondary | ICD-10-CM | POA: Diagnosis not present

## 2016-06-16 ENCOUNTER — Encounter: Payer: Self-pay | Admitting: Endocrinology

## 2016-06-16 ENCOUNTER — Telehealth: Payer: Self-pay

## 2016-06-16 ENCOUNTER — Ambulatory Visit (INDEPENDENT_AMBULATORY_CARE_PROVIDER_SITE_OTHER): Payer: Medicare HMO | Admitting: Endocrinology

## 2016-06-16 VITALS — BP 130/90 | HR 74 | Ht 72.0 in | Wt 297.0 lb

## 2016-06-16 DIAGNOSIS — E1165 Type 2 diabetes mellitus with hyperglycemia: Secondary | ICD-10-CM | POA: Diagnosis not present

## 2016-06-16 DIAGNOSIS — Z794 Long term (current) use of insulin: Secondary | ICD-10-CM

## 2016-06-16 LAB — POCT GLYCOSYLATED HEMOGLOBIN (HGB A1C): HEMOGLOBIN A1C: 8.1

## 2016-06-16 NOTE — Progress Notes (Signed)
Patient ID: Troy Randall, male   DOB: Jun 11, 1960, 56 y.o.   MRN: [id number]           Reason for Appointment: Follow-up for Type 2 Diabetes   History of Present Illness:          Date of diagnosis of type 2 diabetes mellitus: 2013       Background history:   He thinks he was diagnosed to have diabetes in 2015 that he had a glucose of 250 in the hospital in 2013. Last year in 11/15 he was started on metformin and glipizide when he was found to have diabetes.  Did not have any symptoms initially.  Glipizide has been subsequently increased in dosage He has never been monitoring his blood sugar His A1c was as high as 11.2% in 11/15 He  had an A1c  of 7.5 with taking Victoza, Invokana as well as glipizide/metformin as of 1/17  Toujeo insulin was started in 4/17 for marked hyperglycemia He has been able to start Invokana in 5/17 and Victoza in 6/17  Recent history:   INSULIN: Levemir 30 units twice a day, NovoLog 10 units before meals  Non-insulin hypoglycemic drugs: None  Previously on Glipizide Er 10mg , INVOKANA 300 mg daily, Victoza 1.8mg      His A1c was 6.5 but it is now up to 8.1  Current blood sugar patterns, management and problems identified:  He has not been able to get insurance coverage for his medications for about a month and his blood sugars are totally out of control  Although he is taking insulin as above without taking Victoza and Invokana his blood sugars are significantly high at all times  He has taken his mealtime insulin most of the time but basically does not check sugars AFTER meals, mostly checking before  He thinks his portions are better controlled when he takes Victoza  FASTING readings are fairly consistently high and about 180 average  HIGHEST blood sugars are around suppertime  Still not able to exercise or lose weight  Side effects from medications have been: diarrhea from metformin  Compliance with the medical regimen: Fair Hypoglycemia:    never  Glucose monitoring:  done about 1 times a day on average        Glucometer:  One Touch Verio Blood Glucose readings from download for the last 4 weeks  Mean values apply above for all meters except median for One Touch  PRE-MEAL Fasting Lunch Dinner Bedtime Overall  Glucose range: 146-205   1 43-331  10 9-285  109-331  Mean/median:   218   179     Self-care: The diet that the patient has been following is: tries to limit sweet drinks most of the time      Typical meal intake: Breakfast is sometimes biscuit , may skip lunch.  Dinner is meat and vegetables at 6-7 pm.   Will have popcorn for snacks.                Dietician visit, most recent: never                Exercise: none, unable to do much because of back pain  Weight history: Previous range 285-315  Wt Readings from Last 3 Encounters:  06/16/16 297 lb (134.7 kg)  03/20/16 295 lb (133.8 kg)  01/21/16 (!) 305 lb (138.3 kg)    Glycemic control:    Lab Results  Component Value Date   HGBA1C 8.1 06/16/2016   HGBA1C  6.5 03/20/2016   HGBA1C 7.6 11/26/2015   Lab Results  Component Value Date   MICROALBUR 2.3 (H) 11/26/2015   LDLCALC 115 09/26/2014   CREATININE 0.81 01/21/2016    Lab Results  Component Value Date   FRUCTOSAMINE 312 (H) 06/13/2015   FRUCTOSAMINE 279 12/12/2014    Other problems discussed today: See review of systems    Allergies as of 06/16/2016      Reactions   Penicillins Hives      Medication List       Accurate as of 06/16/16 11:59 PM. Always use your most recent med list.          amLODipine-benazepril 5-20 MG capsule Commonly known as:  LOTREL Take 1 capsule by mouth daily.   atorvastatin 10 MG tablet Commonly known as:  LIPITOR Take 10 mg by mouth daily at 6 PM.   canagliflozin 300 MG Tabs tablet Commonly known as:  INVOKANA Take 1 tablet (300 mg total) by mouth daily before breakfast.   fenofibrate 160 MG tablet   glipiZIDE 10 MG 24 hr tablet Commonly known  as:  GLUCOTROL XL   ibuprofen 200 MG tablet Commonly known as:  ADVIL,MOTRIN Take 400-600 mg by mouth every 6 (six) hours as needed for moderate pain.   insulin aspart 100 UNIT/ML FlexPen Commonly known as:  NOVOLOG FLEXPEN 10 Units before meals   Insulin Degludec 200 UNIT/ML Sopn Commonly known as:  TRESIBA FLEXTOUCH Inject 60 Units into the skin daily.   Insulin Pen Needle 32G X 5 MM Misc Commonly known as:  NOVOTWIST Use two daily to inject Victoza and Toujeo.   LEVEMIR FLEXPEN 100 UNIT/ML Pen Generic drug:  Insulin Detemir Inject into the skin daily at 10 pm. Inject 30 units in the morning and 30 units in the evening   montelukast 10 MG tablet Commonly known as:  SINGULAIR   ONETOUCH DELICA LANCETS 33G Misc Use to check blood sugar once a day dx code E11.65   ONETOUCH VERIO test strip Generic drug:  glucose blood TEST twice a day   sildenafil 100 MG tablet Commonly known as:  VIAGRA Take 1 tablet (100 mg total) by mouth daily as needed for erectile dysfunction.   traMADol 50 MG tablet Commonly known as:  ULTRAM Take 50 mg by mouth 2 (two) times daily.   VICTOZA 18 MG/3ML Sopn Generic drug:  liraglutide INJECT 1.8 MG INTO THE SKIN DAILY AT THE SAME TIME   zolpidem 10 MG tablet Commonly known as:  AMBIEN Reported on 04/23/2015       Allergies:  Allergies  Allergen Reactions  . Penicillins Hives    Past Medical History:  Diagnosis Date  . Anemia   . Arthritis   . Hypertension   . Kidney stones     Past Surgical History:  Procedure Laterality Date  . ANKLE SURGERY    . HERNIA REPAIR     umbilical x1 Incisional x1  . INCISIONAL HERNIA REPAIR  04/13/2011   Procedure: LAPAROSCOPIC INCISIONAL HERNIA;  Surgeon: Dalia Heading, MD;  Location: [facility];  Service: General;  Laterality: N/A;  Recurrent Laparoscopic Incisional Herniorraphy with Mesh  . KIDNEY STONE SURGERY      Family History  Problem Relation Age of Onset  . Anesthesia problems Neg Hx     . Hypotension Neg Hx   . Malignant hyperthermia Neg Hx   . Pseudochol deficiency Neg Hx   . Diabetes Father     Social History:  reports that  he has never smoked. He uses smokeless tobacco. He reports that he does not drink alcohol or use drugs.    Review of Systems     Lipid history:   His LDL is Just over 100 and is Being prescribed 10 mg Lipitor from his PCP Triglycerides were high He cannot afford fenofibrate now   Lab Results  Component Value Date   CHOL 172 01/21/2016   HDL 36.30 (L) 01/21/2016   LDLCALC 115 09/26/2014   LDLDIRECT 101.0 01/21/2016   TRIG 292.0 (H) 01/21/2016   CHOLHDL 5 01/21/2016           Eyes:   Most recent eye exam was in 2015  Hypertension: On medications for 10+ years, treated by PCP, taking Lotrel, Blood pressure is highToday  LABS:  Office Visit on 06/16/2016  Component Date Value Ref Range Status  . Hemoglobin A1C 06/16/2016 8.1   Final    Physical Examination:  BP 130/90   Pulse 74   Ht 6' (1.829 m)   Wt 297 lb (134.7 kg)   SpO2 97%   BMI 40.28 kg/m        ASSESSMENT:   Diabetes type 2, uncontrolled with obesity  See history of present illness for detailed discussion of current diabetes management, blood sugar patterns and problems identified  Blood sugars are Uncontrolled now because of his running out of medications Not getting as much benefit from Levemir compared to [name] and has fasting hyperglycemia Blood sugars are also relatively higher after meals although checking mostly after lunch  Eye exams: He cannot afford follow-up now  HYPERTENSION: Blood pressure is relatively high and he will need to follow-up with PCP     PLAN:    He will be given the patient assistance form for getting his medications  Increase evening Levemir by 5 units and NovoLog also by 5 units  He may use Novolog or Humalog whatever he can find  More consistent monitoring of blood sugars after supper  Patient Instructions   Levemir 35 in pm  Take 15 Novolog or Humalog at supper  Check blood sugars on waking up 5/7 days  Also check blood sugars about 2 hours after a meal and do this after different meals by rotation  Recommended blood sugar levels on waking up is 90-130 and about 2 hours after meal is 130-160  Please bring your blood sugar monitor to each visit, thank you     [facility] 06/17/2016, 8:01 AM   Note: This office note was prepared with Dragon voice recognition system technology. Any transcriptional errors that result from this process are unintentional.

## 2016-06-16 NOTE — Patient Instructions (Signed)
Levemir 35 in pm  Take 15 Novolog or Humalog at supper  Check blood sugars on waking up 5/7 days  Also check blood sugars about 2 hours after a meal and do this after different meals by rotation  Recommended blood sugar levels on waking up is 90-130 and about 2 hours after meal is 130-160  Please bring your blood sugar monitor to each visit, thank you

## 2016-06-16 NOTE — Telephone Encounter (Signed)
Gave patient [employer]o Nordisk patient assistance paperwork to take home and fill out- he will be asking for assistance for Novolog, Victoza, and Tresiba- patient was instructed to fill out his portion and bring back for me to fill out the doctors portion and fax- patient stated an understanding

## 2016-07-06 DIAGNOSIS — M25512 Pain in left shoulder: Secondary | ICD-10-CM | POA: Diagnosis not present

## 2016-07-09 DIAGNOSIS — R69 Illness, unspecified: Secondary | ICD-10-CM | POA: Diagnosis not present

## 2016-08-04 ENCOUNTER — Other Ambulatory Visit: Payer: Self-pay | Admitting: Endocrinology

## 2016-08-05 DIAGNOSIS — R69 Illness, unspecified: Secondary | ICD-10-CM | POA: Diagnosis not present

## 2016-08-17 ENCOUNTER — Telehealth: Payer: Self-pay | Admitting: Endocrinology

## 2016-08-17 ENCOUNTER — Other Ambulatory Visit: Payer: Self-pay

## 2016-08-17 DIAGNOSIS — M25512 Pain in left shoulder: Secondary | ICD-10-CM | POA: Diagnosis not present

## 2016-08-17 MED ORDER — CANAGLIFLOZIN 300 MG PO TABS: 300.0000 mg | ORAL_TABLET | Freq: Every day | ORAL | 1 refills | Status: DC

## 2016-08-17 NOTE — Telephone Encounter (Signed)
Called patient and spoke to his wife and let her know that his Troy Randall was sent to [facility] in [place] for a 90 day prescription.

## 2016-08-17 NOTE — Telephone Encounter (Signed)
Please refill.

## 2016-08-17 NOTE — Telephone Encounter (Signed)
Patient requesting that canagliflozin (INVOKANA) 300 MG TABS tablet [[id number]] be called in to [facility] in [place].

## 2016-08-17 NOTE — Telephone Encounter (Signed)
Patient wife called back in. [facility] advised her that they got the meds again. Please send to [facility] in [place].

## 2016-08-17 NOTE — Telephone Encounter (Signed)
Called patient and let his wife know that this was definitely sent to [facility] in [place] this time.

## 2016-08-17 NOTE — Telephone Encounter (Signed)
Please advise if okay to send in. I do not see this is current from last office visit. Please advise.

## 2016-09-14 DIAGNOSIS — M25512 Pain in left shoulder: Secondary | ICD-10-CM | POA: Diagnosis not present

## 2016-09-16 ENCOUNTER — Ambulatory Visit: Payer: Medicare HMO | Admitting: Endocrinology

## 2016-09-26 DIAGNOSIS — R69 Illness, unspecified: Secondary | ICD-10-CM | POA: Diagnosis not present

## 2016-10-01 DIAGNOSIS — K219 Gastro-esophageal reflux disease without esophagitis: Secondary | ICD-10-CM | POA: Diagnosis not present

## 2016-10-01 DIAGNOSIS — R220 Localized swelling, mass and lump, head: Secondary | ICD-10-CM | POA: Diagnosis not present

## 2016-10-01 DIAGNOSIS — R471 Dysarthria and anarthria: Secondary | ICD-10-CM | POA: Diagnosis not present

## 2016-10-26 ENCOUNTER — Telehealth: Payer: Self-pay | Admitting: Endocrinology

## 2016-10-26 ENCOUNTER — Other Ambulatory Visit: Payer: Self-pay

## 2016-10-26 MED ORDER — INSULIN DETEMIR 100 UNIT/ML FLEXPEN: PEN_INJECTOR | SUBCUTANEOUS | 0 refills | Status: DC

## 2016-10-26 NOTE — Telephone Encounter (Signed)
Please advise 

## 2016-10-26 NOTE — Telephone Encounter (Signed)
He can take Humalog in place of Novolog but needs to be seen for follow-up, has not been seen since June

## 2016-10-26 NOTE — Telephone Encounter (Signed)
Patient wife ask if patient can take  Humalog in place of the Levemir please advise

## 2016-10-26 NOTE — Telephone Encounter (Signed)
Called patient and spoke to wife and she stated that he ran out of Levemir and I advised that patient needed to continue this medication so [facility] I have also advised that patient needs to come in for a follow up appointment and she stated that he will have to call us back to schedule.

## 2016-11-19 DIAGNOSIS — R69 Illness, unspecified: Secondary | ICD-10-CM | POA: Diagnosis not present

## 2017-02-23 DIAGNOSIS — R69 Illness, unspecified: Secondary | ICD-10-CM | POA: Diagnosis not present

## 2017-03-31 ENCOUNTER — Other Ambulatory Visit: Payer: Self-pay

## 2017-03-31 ENCOUNTER — Encounter: Payer: Self-pay | Admitting: Family Medicine

## 2017-03-31 ENCOUNTER — Ambulatory Visit (INDEPENDENT_AMBULATORY_CARE_PROVIDER_SITE_OTHER): Payer: Medicare HMO | Admitting: Family Medicine

## 2017-03-31 VITALS — BP 160/100 | HR 64 | Temp 98.8°F | Resp 16 | Ht 72.0 in | Wt 314.0 lb

## 2017-03-31 DIAGNOSIS — I10 Essential (primary) hypertension: Secondary | ICD-10-CM

## 2017-03-31 DIAGNOSIS — N529 Male erectile dysfunction, unspecified: Secondary | ICD-10-CM | POA: Diagnosis not present

## 2017-03-31 DIAGNOSIS — Z23 Encounter for immunization: Secondary | ICD-10-CM

## 2017-03-31 DIAGNOSIS — E782 Mixed hyperlipidemia: Secondary | ICD-10-CM | POA: Diagnosis not present

## 2017-03-31 DIAGNOSIS — E1165 Type 2 diabetes mellitus with hyperglycemia: Secondary | ICD-10-CM

## 2017-03-31 DIAGNOSIS — Z79899 Other long term (current) drug therapy: Secondary | ICD-10-CM | POA: Diagnosis not present

## 2017-03-31 MED ORDER — ATORVASTATIN CALCIUM 40 MG PO TABS: 40.0000 mg | ORAL_TABLET | Freq: Every day | ORAL | 3 refills | Status: DC

## 2017-03-31 MED ORDER — LISINOPRIL 20 MG PO TABS: 20.0000 mg | ORAL_TABLET | Freq: Every day | ORAL | 3 refills | Status: DC

## 2017-03-31 MED ORDER — SILDENAFIL CITRATE 100 MG PO TABS: 100.0000 mg | ORAL_TABLET | Freq: Every day | ORAL | 1 refills | Status: DC | PRN

## 2017-03-31 MED ORDER — GLIPIZIDE ER 5 MG PO TB24
5.0000 mg | ORAL_TABLET | Freq: Every day | ORAL | 1 refills | Status: DC
Start: 2017-03-31 — End: 2017-04-16

## 2017-03-31 NOTE — Progress Notes (Signed)
Patient ID: Troy Randall, male    DOB: 09/03/1960, 57 y.o.   MRN: [id number]  Chief Complaint  Patient presents with  . Establish Care    out of work 4 years, back problems  . Hypertension  . Diabetes    avg 280 last 4 months    Allergies Metformin and related and Penicillins  Subjective:   Troy Randall is a 57 y.o. male who presents to [facility] today.  HPI Here to establish care as a new patient visit.  He reports that he was previously seen at [facility] for PCP needs and the office closed down and then was seen at [facility] office and his doctor subsequently retired.  He reports that he has been diabetic for at least 3-5 years and has never really been controlled.  He was seen by endocrinologist Dr. Lucianne Muss in the past.  He reports he has been off all diabetic medications for at least 6-8 months.  He has occasionally been using some Humalog insulin twice a day that was a family members that it passed away.  He reports that previously his doctor put him on multiple oral/injectable diabetic agents that he was unable to afford.  He has previously been placed on Victoza, Invokana, Trulicity, long-acting insulin, and several others he reports but he cannot remember the names.  He comes in today requesting a placed on the cheapest medications possible.  He reports he does not have the income nor the finances to help pay for all of these expensive medications.  He is not opposed to taking insulin.  He reports that his sugars have been running in the 200s-300s.  He denies any hypoglycemic episodes.  He reports his vision is normal.  Denies any lesions on his feet.  No history of diabetic ulcerations.  Has not had a diabetic eye exam.  Denies any history of diabetic retinopathy or nephropathy.  He reports he was unable to tolerate metformin because it caused severe diarrhea.  He reports he was not able to leave the house because the diarrhea was so significant.  He is not willing  to try any dose of the metformin at this time.  Has a history of hypertension and he reports that ran out of BP medication four days ago.  He reports that his blood pressure has been controlled on and off in the past.  He is not always been compliant with medications. No heart attack in the past.  He has had 2 stress tests which were negative.  He has a questionable diagnosis of a prior CVA.  He reports that last year, woke up and mouth would not work normally and reports he felt like he had had numbing medication in his mouth from the dentist.  Because of the symptoms he reports that he went to the doctor and they told him that they thought he was having a stroke.  He wanted him to go to the emergency department and get a scan of his head.  He did not get the scan nor wish to undergo any testing at that time and was subsequently started on an aspirin. Reports that still has some mild speech issues from time to time and his memory is not as good a result of this incident.  Had no subsequent issues.  He denies any chest pain, shortness of breath, palpitations.  He reports that he did have a screening colonoscopy by Dr. Elnoria Howard 2 years ago which revealed evidence of  polyps.  He is supposed to get repeat colonoscopy in 3 years, 2022.   He reports he is also been on cholesterol medication on and off for several years.  He reports he has been on a statin and on another cholesterol medication.  He has not taken them in quite some time.  He denies any side effects with the statin.  He reports he does not eat a very healthy diet.  He does not do any form of exercise.  He has gained considerable weight over the past several years.  He reports that he is unable to walk long distances secondary to his back pain.  He has been out of work due to back issues for over 4 years.  He would like a refill on Viagra.  He reports that this medication does help with his erectile dysfunction.  He is unable to maintain an erection  without this medication.  He has been given this in the past and is not had any side effects with it.  He reports he urinates well.  No urgency or hesitancy.  He does have a history of some sporadic use of Xanax and Ambien for sleep issues.  He denies any depression or anxiety.  He reports he is very happy with his life.  His granddaughter brings her much joy.  He is married and has 2 grown children.  He has 1 to her granddaughter, named Charlie Pitter, who is 19 months old.   Past Medical History:  Diagnosis Date  . Anemia   . Arthritis   . Diabetes mellitus without complication (HCC)   . Hyperlipidemia   . Hypertension   . Kidney stones     Past Surgical History:  Procedure Laterality Date  . ANKLE SURGERY    . HERNIA REPAIR     umbilical x1 Incisional x1  . INCISIONAL HERNIA REPAIR  04/13/2011   Procedure: LAPAROSCOPIC INCISIONAL HERNIA;  Surgeon: Dalia Heading, MD;  Location: [facility];  Service: General;  Laterality: N/A;  Recurrent Laparoscopic Incisional Herniorraphy with Mesh  . KIDNEY STONE SURGERY      Family History  Problem Relation Age of Onset  . Diabetes Father   . Heart failure Mother   . Cancer Other   . Heart attack Other   . Anesthesia problems Neg Hx   . Hypotension Neg Hx   . Malignant hyperthermia Neg Hx   . Pseudochol deficiency Neg Hx      Social History   Socioeconomic History  . Marital status: Married    Spouse name: Not on file  . Number of children: Not on file  . Years of education: Not on file  . Highest education level: Not on file  Occupational History  . Not on file  Social Needs  . Financial resource strain: Not on file  . Food insecurity:    Worry: Not on file    Inability: Not on file  . Transportation needs:    Medical: Not on file    Non-medical: Not on file  Tobacco Use  . Smoking status: Never Smoker  . Smokeless tobacco: Current User    Types: Snuff  Substance and Sexual Activity  . Alcohol use: No  . Drug use: No  . Sexual  activity: Yes    Birth control/protection: None  Lifestyle  . Physical activity:    Days per week: Not on file    Minutes per session: Not on file  . Stress: Not on file  Relationships  .  Social connections:    Talks on phone: Not on file    Gets together: Not on file    Attends religious service: Not on file    Active member of club or organization: Not on file    Attends meetings of clubs or organizations: Not on file    Relationship status: Not on file  Other Topics Concern  . Not on file  Social History Narrative   Live in [place], [place].   Married for over 30 years.   Has 2 children, age 68/30s. Has one grandchild, 19 months.    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    Eats all food groups.   Wears seatbelt.    Dip 1 can a day.    Current Outpatient Medications on File Prior to Visit  Medication Sig Dispense Refill  . Insulin Pen Needle (NOVOTWIST) 32G X 5 MM MISC Use two daily to inject Victoza and Toujeo. 90 each 5  . ONETOUCH DELICA LANCETS 33G MISC Use to check blood sugar once a day dx code E11.65 50 each 3  . ONETOUCH VERIO test strip TEST twice a day 100 each 3   No current facility-administered medications on file prior to visit.     Review of Systems  Constitutional: Negative for appetite change, chills, fever and unexpected weight change.  HENT: Negative for dental problem, trouble swallowing and voice change.   Eyes: Negative for visual disturbance.  Respiratory: Negative for cough, chest tightness, shortness of breath and wheezing.   Cardiovascular: Negative for chest pain, palpitations and leg swelling.  Gastrointestinal: Negative for abdominal pain, diarrhea, nausea and vomiting.  Endocrine: Negative for polydipsia, polyphagia and polyuria.  Genitourinary: Negative for decreased urine volume, difficulty urinating, dysuria, frequency, penile swelling, testicular pain and urgency.  Musculoskeletal: Negative for myalgias  and neck stiffness.  Skin: Negative for rash.  Neurological: Negative for dizziness, tremors, syncope, facial asymmetry, weakness and headaches.  Hematological: Negative for adenopathy. Does not bruise/bleed easily.  Psychiatric/Behavioral: Negative for agitation, dysphoric mood and sleep disturbance. The patient is not nervous/anxious.      Objective:   BP (!) 160/100 (BP Location: Left Arm, Patient Position: Sitting, Cuff Size: Normal)   Pulse 64   Temp 98.8 F (37.1 C) (Temporal)   Resp 16   Ht 6' (1.829 m)   Wt (!) 314 lb (142.4 kg)   SpO2 98%   BMI 42.59 kg/m   Physical Exam  Constitutional: He is oriented to person, place, and time. He appears well-developed and well-nourished.  HENT:  Head: Normocephalic and atraumatic.  Eyes: Pupils are equal, round, and reactive to light. EOM are normal.  Neck: Normal range of motion. Neck supple. No JVD present. No tracheal deviation present. No thyromegaly present.  No carotid bruits auscultated.  Cardiovascular: Normal rate, regular rhythm and normal heart sounds.  Pulmonary/Chest: Effort normal and breath sounds normal.  Musculoskeletal: He exhibits no edema.  Lymphadenopathy:    He has no cervical adenopathy.  Neurological: He is alert and oriented to person, place, and time. No cranial nerve deficit.  Skin: Skin is warm, dry and intact. No pallor.  Psychiatric: He has a normal mood and affect. His behavior is normal. Judgment and thought content normal.  Vitals reviewed.    Assessment and Plan  1. Uncontrolled type 2 diabetes mellitus with hyperglycemia, without long-term current use of insulin [facility]) Did discuss with patient that prior to initiating insulin on needed to get  some blood sugar readings and hemoglobin A1c.  We will go ahead and call in Glucotrol XL 5 mg at this time.  He is not willing to try metformin due to prior GI side effects in the past.  He is willing to use insulin if needed.  We will check labs today.  We  did discuss the need for dietary modification, carbohydrate reduction, decreased sugar intake, and weight loss in order to control his diabetes.  We also discussed that uncontrolled diabetes puts him at increased risk for heart attack, stroke, peripheral vascular disease, retinopathy, neuropathy, and nephropathy. - HgB A1c - glipiZIDE (GLUCOTROL XL) 5 MG 24 hr tablet; Take 1 tablet (5 mg total) by mouth daily with breakfast.  Dispense: 30 tablet; Refill: 1 - Microalbumin / creatinine urine ratio  2. Essential hypertension We will start lisinopril at this time.  Titrate dose up as needed.  Check BMP today.  Patient asked to cut down on his salt intake. - lisinopril (PRINIVIL,ZESTRIL) 20 MG tablet; Take 1 tablet (20 mg total) by mouth daily.  Dispense: 90 tablet; Refill: 3 Lifestyle modifications discussed with patient including a diet emphasizing vegetables, fruits, and whole grains. Limiting intake of sodium to less than 2,400 mg per day.  Recommendations discussed include consuming low-fat dairy products, poultry, fish, legumes, non-tropical vegetable oils, and nuts; and limiting intake of sweets, sugar-sweetened beverages, and red meat. Discussed following a plan such as the Dietary Approaches to Stop Hypertension (DASH) diet. Patient to read up on this diet.   3. Mixed hyperlipidemia At this time will start Lipitor 40 mg p.o. nightly.  Lipid panel at this time.Hyperlipidemia and the associated risk of ASCVD were discussed today. Primary vs. Secondary prevention of ASCVD were discussed and how it relates to patient morbidity, mortality, and quality of life. Shared decision making with patient including the risks of statins vs.benefits of ASCVD risk reduction discussed.  Risks of stains discussed including myopathy, rhabdomyoloysis, liver problems, increased risk of diabetes discussed. We discussed heart healthy diet, lifestyle modifications, risk factor modifications, and adherence to the recommended  treatment plan. We discussed the need to periodically monitor lipid panel and liver function tests while on statin therapy.   - atorvastatin (LIPITOR) 40 MG tablet; Take 1 tablet (40 mg total) by mouth daily.  Dispense: 90 tablet; Refill: 3 - Lipid panel  4. Immunization due Vaccinations given today. - Flu Vaccine QUAD 6+ mos PF IM (Fluarix Quad PF) - Pneumococcal conjugate vaccine 13-valent IM  5. Erectile dysfunction, unspecified erectile dysfunction type Patient counseled in detail regarding the risks of medication. Told to call or return to clinic if develop any worrisome signs or symptoms. Patient voiced understanding.   - sildenafil (VIAGRA) 100 MG tablet; Take 1 tablet (100 mg total) by mouth daily as needed for erectile dysfunction.  Dispense: 20 tablet; Refill: 1  6. High risk medication use Check labs today. - Basic metabolic panel - Hepatic function panel 7.  Obesity The patient is asked to make an attempt to improve diet and exercise patterns to aid in medical management of this problem.  Office visit was greater than 45 minutes.  Greater than 50% spent counseling and coordinating care.  We did discuss the need for medications, compliance, and the need for diet and exercise modifications.  We will discuss further at next visit.  We will plan to call patient with lab results and discuss needed medications prior to his next visit.  He will consider seeing a nutritionist/diabetic educator. Return  in about 1 month (around 04/28/2017). Aliene Beams, MD 03/31/2017

## 2017-03-31 NOTE — Patient Instructions (Signed)
Start the BP medicine: lisinopril 20 mg in the morning Start the cholesterol medicine: atorvastatin 20 mg each night.  Start the diabetes medicine: glucotrol xl 5 mg a day  Start your aspirin 81 mg a day, for heart protection

## 2017-04-01 LAB — LIPID PANEL
CHOL/HDL RATIO: 5.9 (calc) — AB (ref <5.0)
CHOLESTEROL: 218 mg/dL — AB (ref <200)
HDL: 37 mg/dL — ABNORMAL LOW (ref >40)
Non-HDL Cholesterol (Calc): 181 mg/dL (calc) — ABNORMAL HIGH (ref <130)
Triglycerides: 666 mg/dL — ABNORMAL HIGH (ref <150)

## 2017-04-01 LAB — MICROALBUMIN / CREATININE URINE RATIO
Creatinine, Urine: 104 mg/dL (ref 20–320)
MICROALB UR: 24.2 mg/dL
MICROALB/CREAT RATIO: 233 ug/mg{creat} — AB (ref <30)

## 2017-04-01 LAB — HEPATIC FUNCTION PANEL
AG Ratio: 1.5 (calc) (ref 1.0–2.5)
ALKALINE PHOSPHATASE (APISO): 51 U/L (ref 40–115)
ALT: 20 U/L (ref 9–46)
AST: 17 U/L (ref 10–35)
Albumin: 4.3 g/dL (ref 3.6–5.1)
Bilirubin, Direct: 0.1 mg/dL (ref 0.0–0.2)
Globulin: 2.8 g/dL (calc) (ref 1.9–3.7)
Indirect Bilirubin: 0.3 mg/dL (calc) (ref 0.2–1.2)
TOTAL PROTEIN: 7.1 g/dL (ref 6.1–8.1)
Total Bilirubin: 0.4 mg/dL (ref 0.2–1.2)

## 2017-04-01 LAB — BASIC METABOLIC PANEL
BUN: 14 mg/dL (ref 7–25)
CHLORIDE: 103 mmol/L (ref 98–110)
CO2: 27 mmol/L (ref 20–32)
CREATININE: 0.9 mg/dL (ref 0.70–1.33)
Calcium: 9.5 mg/dL (ref 8.6–10.3)
Glucose, Bld: 255 mg/dL — ABNORMAL HIGH (ref 65–99)
Potassium: 3.9 mmol/L (ref 3.5–5.3)
Sodium: 140 mmol/L (ref 135–146)

## 2017-04-01 LAB — HEMOGLOBIN A1C
Hgb A1c MFr Bld: 9.5 % of total Hgb — ABNORMAL HIGH (ref <5.7)
Mean Plasma Glucose: 226 (calc)
eAG (mmol/L): 12.5 (calc)

## 2017-04-02 ENCOUNTER — Encounter: Payer: Self-pay | Admitting: Family Medicine

## 2017-04-02 ENCOUNTER — Telehealth: Payer: Self-pay | Admitting: Family Medicine

## 2017-04-02 DIAGNOSIS — E1165 Type 2 diabetes mellitus with hyperglycemia: Secondary | ICD-10-CM

## 2017-04-02 DIAGNOSIS — E782 Mixed hyperlipidemia: Secondary | ICD-10-CM

## 2017-04-02 MED ORDER — FENOFIBRATE 145 MG PO TABS: 145.0000 mg | ORAL_TABLET | Freq: Every day | ORAL | 3 refills | Status: DC

## 2017-04-02 MED ORDER — INSULIN SYRINGES (DISPOSABLE) U-100 1 ML MISC: 1.0000 | Freq: Every day | 0 refills | Status: DC

## 2017-04-02 MED ORDER — INSULIN GLARGINE 100 UNIT/ML ~~LOC~~ SOLN: 15.0000 [IU] | Freq: Every day | SUBCUTANEOUS | 11 refills | Status: DC

## 2017-04-02 NOTE — Telephone Encounter (Signed)
Patient left message asking for lab results.  

## 2017-04-02 NOTE — Telephone Encounter (Signed)
Please call patient and advised that his triglycerides are crazy elevated.  His triglycerides were 666.  His triglycerides were so high that we are unable to calculate his LDL/bad cholesterol.  He was asked to start the Lipitor.  I am also calling in TriCor.  I am not sure with the cost of that will be, but check and see if he can afford it.  He also had a hemoglobin A1c of 9.5%.  This is not as bad as actually thought it would be.  He needs to continue the Glucotrol XL 5 mg a day.  Then after he has been taking that for 2 weeks we will go ahead and increase it to 10 mg a day.  I also would like for him to start some Lantus insulin in the vial because it will be less expensive.  I would like for him to start Lantus 15 units at bedtime.  He is going to need to check some sugars and let me know how they are running.  He is going to need to check his fasting sugar in the morning, some sugars before he eats his meals, and some sugars 2 hours after.  He is going to need to write these values down and bring them back and call in the next week with the readings.  Please advised to make sure that he got the other medicines that were called in for his blood pressure.  Advised him that his liver tests were within normal limits.  He was spilling some protein in his urine as a result of his uncontrolled diabetes and uncontrolled blood pressure.  His overall kidney function and creatinine were within normal limits.  Please advise him of this information and advised him to keep his scheduled follow-up.  Please also call in his needed insulin supplies such as syringes and needles.

## 2017-04-02 NOTE — Telephone Encounter (Signed)
Patient informed of message below, verbalized understanding.  Was able to get all of his medications

## 2017-04-07 ENCOUNTER — Telehealth: Payer: Self-pay | Admitting: Family Medicine

## 2017-04-07 MED ORDER — INSULIN DETEMIR 100 UNIT/ML ~~LOC~~ SOLN: 15.0000 [IU] | Freq: Every day | SUBCUTANEOUS | 2 refills | Status: DC

## 2017-04-07 NOTE — Telephone Encounter (Signed)
Start Levemir 15 units nightly.  Please call with blood sugar readings over the next couple days as directed.  Discontinue the Lantus and take off medication list.  These call in medication of Levemir 15 units injected subcutaneously nightly as directed.  #1 vial, #2 refills.  Keep scheduled follow-up.

## 2017-04-07 NOTE — Telephone Encounter (Signed)
Patient's insulin is not covered by insurance. It would be more than $1000 for 30 mL. Insurance prefers Banker, tresiba, or Hospital doctor. Please advise.

## 2017-04-16 ENCOUNTER — Telehealth: Payer: Self-pay | Admitting: Family Medicine

## 2017-04-16 DIAGNOSIS — E1165 Type 2 diabetes mellitus with hyperglycemia: Secondary | ICD-10-CM

## 2017-04-16 MED ORDER — GLIPIZIDE ER 10 MG PO TB24: 10.0000 mg | ORAL_TABLET | Freq: Every day | ORAL | 1 refills | Status: DC

## 2017-04-16 NOTE — Telephone Encounter (Signed)
I left patient a message at the number below.

## 2017-04-16 NOTE — Telephone Encounter (Signed)
He has confirmed 15 unit dose

## 2017-04-16 NOTE — Telephone Encounter (Signed)
Confirm he is taking the insulin 15 units at bedtime of lantus.

## 2017-04-16 NOTE — Telephone Encounter (Signed)
He does not have exact readings with. He states every blood sugar is first thing in the morning, fasting. He states the best reading in past 7 days haas been 231. Everything else has been 270s. He will call back with exact readings.

## 2017-04-16 NOTE — Telephone Encounter (Signed)
Pt said his blood sugar was 304 this am and has ranged 240 the past 2 days.  He does not think the med is working very well.  He wants to talk with someone today.  Please call him at [phone]

## 2017-04-16 NOTE — Telephone Encounter (Signed)
Called patient to discuss blood sugars. Taking insulin at night around 9 pm. Will increase insulin to 20 units at night. Check sugars in the morning. Increase the glipizide xl to 10 mg a day. He will call on Tuesday with the blood sugar readings. Will adjust readings accordingly.

## 2017-04-20 ENCOUNTER — Telehealth: Payer: Self-pay | Admitting: Family Medicine

## 2017-04-20 NOTE — Telephone Encounter (Signed)
Please see the note from the patient. I had asked for him to be called regarding his blood sugars. Confirm the information in the other phone call, on same patient, and I will increase his levemir insulin. Janine Limbo. Tracie Harrier, MD

## 2017-04-20 NOTE — Telephone Encounter (Signed)
Called patient  Phone busy

## 2017-04-20 NOTE — Telephone Encounter (Signed)
Please call patient and find out how his sugars are running over the weekend and today. Please confirm his updated insulin dose and what he is taking. Confirm no hypoglycemic episodes. Ask if he started the glipizide xl 10 mg po qd dosing too. Janine Limbo. Tracie Harrier, MD

## 2017-04-20 NOTE — Telephone Encounter (Signed)
Calling in to give numbers -  4/13 8:48 am  312  4/14 10:18 am 280 7:05 pm  257  4/15 7:49 am 284 2:20 pm  298  4/16 7:47 am  324

## 2017-04-21 NOTE — Telephone Encounter (Signed)
Other note updated.

## 2017-04-21 NOTE — Telephone Encounter (Signed)
Patient states his CBGs are 4/13 8:48 am  312  4/14 10:18 am 280 7:05 pm  257  4/15 7:49 am 284 2:20 pm  298  4/16 7:47 am  324

## 2017-04-22 ENCOUNTER — Telehealth: Payer: Self-pay | Admitting: Family Medicine

## 2017-04-22 NOTE — Telephone Encounter (Signed)
Dr Delton See is following up with this message

## 2017-04-22 NOTE — Telephone Encounter (Signed)
Tried calling patient, line busy

## 2017-04-22 NOTE — Telephone Encounter (Signed)
Please call patient and ask the questions that I put in the message. Troy Randall. Tracie Harrier, MD

## 2017-04-22 NOTE — Telephone Encounter (Signed)
He should increase his insulin to 20 u a day.  Follow sugar 2 x a day, fasting and 2 h post meal.  Call on Tuesday with new readings.

## 2017-04-22 NOTE — Telephone Encounter (Signed)
Spoke with patient regarding previous phone calls. His CBG yesterday morning was 299. CBG this morning was 346. Did not get a second CBG yesterday. I advised him I would send message to Dr.Nelson for review and would call him back with her recommendations. He verbalized understanding.

## 2017-04-22 NOTE — Telephone Encounter (Signed)
Spoke with patient. He states he is already using 20 u a day. He hasn't checked his CBG twice the last couple of days but had been up until then. I advised him I would let you know and call him back before leaving the office today.

## 2017-04-22 NOTE — Telephone Encounter (Signed)
Pt called and left 2 messages wanting a update on what he needs to do regarding his meds and blood sugar.  Please call him and ask Dr Delton See if you have any questions.

## 2017-04-22 NOTE — Telephone Encounter (Signed)
Goal is to bring sugar down over the next couple of weeks.  Increase insulin by 2-4 units every 3 days.  White down 2 sugars a day.  Keep increasing until the fasting glucose approaches 150.  Call the office with progress once a week.  Call sooner for questions or problems.  His sugars are undesirable for long term control but not immediately dangerous.

## 2017-04-22 NOTE — Telephone Encounter (Signed)
Spoke with patient and advised him of Dr.Nelson's recommendations with verbal understanding. He is in agreement with tx plan and understands to call next week to give an update regarding fasting blood sugars.

## 2017-04-22 NOTE — Telephone Encounter (Signed)
Left message at [phone] requesting call back

## 2017-04-26 NOTE — Telephone Encounter (Signed)
Called patient regarding message below. No answer, unable to leave message.  

## 2017-04-27 NOTE — Telephone Encounter (Signed)
Called patient regarding message below. No answer, unable to leave message.  

## 2017-04-27 NOTE — Telephone Encounter (Signed)
4/19 10:25 am 296 1 hour after supper 379    4/20 08:49 am 235 2:12am 314 

## 2017-04-28 ENCOUNTER — Ambulatory Visit: Payer: Medicare HMO | Admitting: Family Medicine

## 2017-04-28 NOTE — Telephone Encounter (Signed)
4/19 10:25 am 296 1 hour after supper 379    4/20 08:49 am 235 2:12am 314

## 2017-04-28 NOTE — Telephone Encounter (Signed)
The previous note is taken from another call and placed here for clarity. The information was received by [facility]

## 2017-04-28 NOTE — Telephone Encounter (Signed)
Other note updated. 

## 2017-04-29 ENCOUNTER — Telehealth: Payer: Self-pay | Admitting: Family Medicine

## 2017-04-29 NOTE — Telephone Encounter (Signed)
I called patient and spoke to him regarding his sugars. He is very frustrated. He has talked to several different people over the last few days and he states someone called him from here yesterday and he gave all of the following information to them, but there is no documentation of it in his chart. He states he would like to keep communication more simple. He states taking the glipizide XL 10 mg on 4/13. On 04/22/17 he got this advice relayed to him by Abby from Dr. Delton See "Goal is to bring sugar down over the next couple of weeks.  Increase insulin by 2-4 units every 3 days.  White down 2 sugars a day.  Keep increasing until the fasting glucose approaches 150.  Call the office with progress once a week.  Call sooner for questions or problems.  His sugars are undesirable for long term control but not immediately dangerous." Patient states his CBGs haven been as follows:  4/19 10:25 am 296 1 hr after dinner 379  4/20 8:49 am 235 2:12 pm 314 He took 27 units of insulin that night  4/21  11:50 am 230 5:10 pm 371 (4hrs after lunch) He took 30 units of insulin that night  4/22 8:20 am 228 3:53 pm 260 He took 33 units of insulin that night  4/23 8:18 am 242 4:56 pm 310 He took 35 units of insulin that night  4/24 8:18 am 259 He took 38 units of insulin last night  4/25 8:42 256   He states that Dr. Lucianne Muss had him on the same insulin and had him taking 30 units BID.  Please advise what he should do.

## 2017-04-30 NOTE — Telephone Encounter (Signed)
Please call patient and advised to increase his Levemir to 25 units twice a day.  He should separate dosing 12 hours apart.  Please review his med list with him.  In addition, he needs to keep his scheduled follow-up.

## 2017-04-30 NOTE — Telephone Encounter (Signed)
I left message on patient's phone but will call again on Monday since I leave early today and did not reach him on the phone.

## 2017-05-03 NOTE — Telephone Encounter (Signed)
Called patient regarding message below. No answer, unable to leave message.  

## 2017-05-04 DIAGNOSIS — R69 Illness, unspecified: Secondary | ICD-10-CM | POA: Diagnosis not present

## 2017-05-04 MED ORDER — GLUCOSE BLOOD VI STRP: ORAL_STRIP | 3 refills | Status: AC

## 2017-05-04 MED ORDER — INSULIN DETEMIR 100 UNIT/ML ~~LOC~~ SOLN: 15.0000 [IU] | Freq: Every day | SUBCUTANEOUS | 2 refills | Status: DC

## 2017-05-04 NOTE — Telephone Encounter (Signed)
I called patient to follow-up. He did get the message on Friday and has been taking 25 units BID since. Here are his readings from the weekend   27th 266 am  28th 245 am 379 1 hr after eating  29th 281 am 259 1 hr after eating  30th  228 am

## 2017-05-04 NOTE — Telephone Encounter (Signed)
Patient informed of message below, verbalized understanding.  

## 2017-05-04 NOTE — Telephone Encounter (Signed)
Advise to increase to 30 units of levemir twice a day. No more changes until OV on 05/12/17. Keep scheduled follow up.  Bring record of sugar reading to OV.

## 2017-05-05 DIAGNOSIS — R69 Illness, unspecified: Secondary | ICD-10-CM | POA: Diagnosis not present

## 2017-05-12 ENCOUNTER — Encounter: Payer: Self-pay | Admitting: Family Medicine

## 2017-05-12 ENCOUNTER — Other Ambulatory Visit: Payer: Self-pay

## 2017-05-12 ENCOUNTER — Ambulatory Visit (INDEPENDENT_AMBULATORY_CARE_PROVIDER_SITE_OTHER): Payer: Medicare HMO | Admitting: Family Medicine

## 2017-05-12 VITALS — BP 134/89 | HR 71 | Temp 98.5°F | Resp 16 | Ht 72.0 in | Wt 308.8 lb

## 2017-05-12 DIAGNOSIS — I10 Essential (primary) hypertension: Secondary | ICD-10-CM

## 2017-05-12 DIAGNOSIS — E1169 Type 2 diabetes mellitus with other specified complication: Secondary | ICD-10-CM | POA: Diagnosis not present

## 2017-05-12 DIAGNOSIS — Z794 Long term (current) use of insulin: Secondary | ICD-10-CM | POA: Diagnosis not present

## 2017-05-12 MED ORDER — LISINOPRIL 40 MG PO TABS: 40.0000 mg | ORAL_TABLET | Freq: Every day | ORAL | 3 refills | Status: DC

## 2017-05-12 MED ORDER — INSULIN DETEMIR 100 UNIT/ML ~~LOC~~ SOLN: SUBCUTANEOUS | 1 refills | Status: DC

## 2017-05-12 NOTE — Patient Instructions (Signed)
You are now going to be on 30 units twice a day of the levemir.  Call me in three days with your fasting am sugar, and several sugars that are 2 hours after eating or before meals.  Increase your lisinopril to 40 mg in the am, you can take 2 of the 20 mg tablets at the same time.  Continue your glipizide xl.

## 2017-05-12 NOTE — Progress Notes (Signed)
Patient ID: Troy Randall, male    DOB: 09/25/1960, 57 y.o.   MRN: [id number]  Chief Complaint  Patient presents with  . Diabetes    1 month follow up    Allergies Metformin and related and Penicillins  Subjective:   Troy Randall is a 57 y.o. male who presents to [facility] today.  HPI Here for follow up. On levemir 25 units, 8-9 q am and 8-9 q pm. Also on  glucotrol xl 10 mg a day. No side effects. Has been on this dose of Levemir since 04/30/2017. Has not had any hypoglycemic episodes.  Fasting sugars in the morning: 200s, highest 282, lowest 210 8:00 pm, 210, before meals. At meal time was 284, 204  Patient does report he is not compliant with his diet.  He reports he does eat an unhealthy diet including sweets.  He reports he is not willing to do insulin several times a day before meals because he is not at home and does not wish to pay for a flex pen.  He reports he only wants any medications that are on the generic list at [facility].  He reports he has been taking his aspirin each day.  Patient is still dipping on a daily basis.  He reports he is doing it as much as he can. Not interested in quitting.  Diabetes  He presents for his follow-up diabetic visit. He has type 2 diabetes mellitus. His disease course has been fluctuating. There are no hypoglycemic associated symptoms. There are no diabetic associated symptoms. Pertinent negatives for diabetes include no blurred vision, no chest pain, no foot paresthesias, no foot ulcerations, no polydipsia, no polyphagia, no polyuria, no visual change, no weakness and no weight loss. There are no hypoglycemic complications. Symptoms are improving. Diabetic complications include heart disease and impotence. Risk factors for coronary artery disease include diabetes mellitus, dyslipidemia, family history, obesity, male sex, hypertension, sedentary lifestyle, stress and tobacco exposure. Current diabetic treatment includes oral  agent (monotherapy) and insulin injections. He is compliant with treatment some of the time. His weight is stable. He is following a generally unhealthy diet. When asked about meal planning, he reported none. He has not had a previous visit with a dietitian. He rarely participates in exercise. His home blood glucose trend is decreasing steadily. His overall blood glucose range is >200 mg/dl. An ACE inhibitor/angiotensin II receptor blocker is being taken. He does not see a podiatrist.Eye exam is not current.    Past Medical History:  Diagnosis Date  . Anemia   . Arthritis   . Diabetes mellitus without complication (HCC)   . Hyperlipidemia   . Hypertension   . Kidney stones     Past Surgical History:  Procedure Laterality Date  . ANKLE SURGERY    . HERNIA REPAIR     umbilical x1 Incisional x1  . INCISIONAL HERNIA REPAIR  04/13/2011   Procedure: LAPAROSCOPIC INCISIONAL HERNIA;  Surgeon: Dalia Heading, MD;  Location: [facility];  Service: General;  Laterality: N/A;  Recurrent Laparoscopic Incisional Herniorraphy with Mesh  . KIDNEY STONE SURGERY      Family History  Problem Relation Age of Onset  . Diabetes Father   . Heart failure Mother   . Cancer Other   . Heart attack Other   . Anesthesia problems Neg Hx   . Hypotension Neg Hx   . Malignant hyperthermia Neg Hx   . Pseudochol deficiency Neg Hx  Social History   Socioeconomic History  . Marital status: Married    Spouse name: Not on file  . Number of children: Not on file  . Years of education: Not on file  . Highest education level: Not on file  Occupational History  . Not on file  Social Needs  . Financial resource strain: Not on file  . Food insecurity:    Worry: Not on file    Inability: Not on file  . Transportation needs:    Medical: Not on file    Non-medical: Not on file  Tobacco Use  . Smoking status: Never Smoker  . Smokeless tobacco: Current User    Types: Snuff  Substance and Sexual Activity  .  Alcohol use: No  . Drug use: No  . Sexual activity: Yes    Birth control/protection: None  Lifestyle  . Physical activity:    Days per week: Not on file    Minutes per session: Not on file  . Stress: Not on file  Relationships  . Social connections:    Talks on phone: Not on file    Gets together: Not on file    Attends religious service: Not on file    Active member of club or organization: Not on file    Attends meetings of clubs or organizations: Not on file    Relationship status: Not on file  Other Topics Concern  . Not on file  Social History Narrative   Live in [place], [place].   Married for over 30 years.   Has 2 children, age 74/30s. Has one grandchild, 19 months.    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    Eats all food groups.   Wears seatbelt.    Dip 1 can a day.    Current Outpatient Medications on File Prior to Visit  Medication Sig Dispense Refill  . atorvastatin (LIPITOR) 40 MG tablet Take 1 tablet (40 mg total) by mouth daily. 90 tablet 3  . fenofibrate (TRICOR) 145 MG tablet Take 1 tablet (145 mg total) by mouth daily. 30 tablet 3  . glipiZIDE (GLUCOTROL XL) 10 MG 24 hr tablet Take 1 tablet (10 mg total) by mouth daily with breakfast. 90 tablet 1  . glucose blood (ONETOUCH VERIO) test strip TEST twice a day 100 each 3  . Insulin Syringes, Disposable, (B-D INSULIN SYRINGE 1CC) U-100 1 ML MISC 1 each by Does not apply route daily. 100 each 0  . ONETOUCH DELICA LANCETS 33G MISC Use to check blood sugar once a day dx code E11.65 50 each 3  . sildenafil (VIAGRA) 100 MG tablet Take 1 tablet (100 mg total) by mouth daily as needed for erectile dysfunction. 20 tablet 1  . Insulin Pen Needle (NOVOTWIST) 32G X 5 MM MISC Use two daily to inject Victoza and Toujeo. (Patient not taking: Reported on 05/12/2017) 90 each 5   No current facility-administered medications on file prior to visit.     Review of Systems  Constitutional:  Negative for weight loss.  Eyes: Negative for blurred vision.  Cardiovascular: Negative for chest pain.  Endocrine: Negative for polydipsia, polyphagia and polyuria.  Genitourinary: Positive for impotence.  Neurological: Negative for weakness.     Objective:   BP 134/89 (BP Location: Right Arm, Patient Position: Sitting, Cuff Size: Normal)   Pulse 71   Temp 98.5 F (36.9 C) (Oral)   Resp 16   Ht 6' (1.829 m)  Wt (!) 308 lb 12.8 oz (140.1 kg)   SpO2 96%   BMI 41.88 kg/m   Physical Exam  Constitutional: He is oriented to person, place, and time. He appears well-developed and well-nourished.  HENT:  Head: Normocephalic and atraumatic.  Eyes: Pupils are equal, round, and reactive to light. EOM are normal.  Neck: Normal range of motion. Neck supple. No thyromegaly present.  Cardiovascular: Normal rate, regular rhythm and normal heart sounds.  Pulmonary/Chest: Effort normal and breath sounds normal.  Abdominal: Soft. Bowel sounds are normal.  Musculoskeletal: He exhibits no edema.  Neurological: He is alert and oriented to person, place, and time. No cranial nerve deficit.  Skin: Skin is warm, dry and intact.  Psychiatric: He has a normal mood and affect. His behavior is normal. Thought content normal.  Vitals reviewed.    Assessment and Plan  1. Type 2 diabetes mellitus with other specified complication, with long-term current use of insulin (HCC) Increase to 30 units twice a day of the levemir, subcutaneously. Patient was counseled to monitor his blood sugars.  He was told not to skip meals.  He was told to call with any side effects or problems.  He was counseled regarding signs and symptoms of hypoglycemia. He was instructed to call me in three days with fasting am sugar, and several sugars that are 2 hours after eating or before meals. Continue your glipizide xl.  Patient defers mealtime insulin due to the fact that he reports he is not home.  He also defers paying for  any flex pens.  He reports he does not want to spend the money and is cheaper with his insurance to use vials.  He defers being followed by endocrinology. 2. Essential hypertension Increase lisinopril to 40 mg p.o. Daily. Patient counseled in detail regarding the risks of medication. Told to call or return to clinic if develop any worrisome signs or symptoms. Patient voiced understanding.  Patient defers BMP checked today despite recommendations.  He does agree to having this checked at follow-up. - lisinopril (PRINIVIL,ZESTRIL) 40 MG tablet; Take 1 tablet (40 mg total) by mouth daily.  Dispense: 90 tablet; Refill: 3  Continue statin at this time. Recommended to bring all medications to his next visit. Told to call with any questions or concerns. Defers diabetic education at this time. Counseled regarding diabetic diet.  The 5 A's Model for treating Tobacco Use and Dependence was used today. I have identified and documented tobacco use status for this patient. I have urged the patient to quit tobacco use. At this time, the patient is unwilling and not ready to attempt to quit. I have provided patient with information regarding risks, cessation techniques, and interventions that might increase future attempts to quit smoking. I will plan on again addressing tobacco dependence at the next visit. Foot exam deferred. Eye exam referral has been placed. Return in about 1 month (around 06/09/2017). Aliene Beams, MD 05/12/2017

## 2017-06-06 ENCOUNTER — Other Ambulatory Visit: Payer: Self-pay | Admitting: Family Medicine

## 2017-06-10 ENCOUNTER — Encounter: Payer: Self-pay | Admitting: Family Medicine

## 2017-06-11 ENCOUNTER — Encounter: Payer: Self-pay | Admitting: Family Medicine

## 2017-06-15 ENCOUNTER — Encounter: Payer: Self-pay | Admitting: Family Medicine

## 2017-06-15 ENCOUNTER — Other Ambulatory Visit: Payer: Self-pay

## 2017-06-15 ENCOUNTER — Ambulatory Visit (INDEPENDENT_AMBULATORY_CARE_PROVIDER_SITE_OTHER): Payer: Medicare HMO | Admitting: Family Medicine

## 2017-06-15 VITALS — BP 154/86 | HR 78 | Temp 97.8°F | Resp 12 | Ht 72.0 in | Wt 307.0 lb

## 2017-06-15 DIAGNOSIS — I10 Essential (primary) hypertension: Secondary | ICD-10-CM | POA: Diagnosis not present

## 2017-06-15 DIAGNOSIS — R69 Illness, unspecified: Secondary | ICD-10-CM | POA: Diagnosis not present

## 2017-06-15 DIAGNOSIS — E1169 Type 2 diabetes mellitus with other specified complication: Secondary | ICD-10-CM | POA: Diagnosis not present

## 2017-06-15 DIAGNOSIS — E782 Mixed hyperlipidemia: Secondary | ICD-10-CM

## 2017-06-15 DIAGNOSIS — Z794 Long term (current) use of insulin: Secondary | ICD-10-CM

## 2017-06-15 DIAGNOSIS — F419 Anxiety disorder, unspecified: Secondary | ICD-10-CM

## 2017-06-15 MED ORDER — AMLODIPINE BESYLATE 10 MG PO TABS
10.0000 mg | ORAL_TABLET | Freq: Every day | ORAL | 3 refills | Status: DC
Start: 2017-06-15 — End: 2021-10-30

## 2017-06-15 MED ORDER — CITALOPRAM HYDROBROMIDE 20 MG PO TABS: 20.0000 mg | ORAL_TABLET | Freq: Every day | ORAL | 3 refills | Status: DC

## 2017-06-15 MED ORDER — INSULIN DETEMIR 100 UNIT/ML FLEXPEN: 40.0000 [IU] | PEN_INJECTOR | Freq: Two times a day (BID) | SUBCUTANEOUS | 1 refills | Status: DC

## 2017-06-15 NOTE — Progress Notes (Signed)
Patient ID: Troy Randall, male    DOB: 01-30-1960, 57 y.o.   MRN: [id number]  Chief Complaint  Patient presents with  . Diabetes    1 month follow up    Allergies Metformin and related and Penicillins  Subjective:   Troy Randall is a 57 y.o. male who presents to [facility] today.  HPI Troy Randall presents for follow-up today.  He reports that he has been somewhat stressed and has been feeling down.  He reports that he has to deal with mother who is 35 years old.  He reports that he has a lot of stress in his life and that he has had a lot on mind. Uses nyquil to help sleep. Has used medications for sleep in the past. Was given xanax for sleep but then would wake up out the night and he did not like being on this medication because he knows it is habit-forming.Marland Kitchen  He reports that his mind races and thinks a lot.  He describes it as his mind is busy a lot. Does not feel as down now as did in the past. Has been out of work for over 4 years secondary to an injury. Nothing will ever get back to what his plans were and this makes him feel down sometimes.  Reports that he did lose finances and financial stability with his job loss. Feels irritable and set off easy.  Would be interested in trying a medication.  Reports that he has not been motivated to really work on his diet.  He reports that he is compliant with using his Levemir insulin at 30 units twice a day but does not routinely use the short acting insulin before meals.  He only uses that if he is at home when he eats a meal.  He reports that he does not eat a healthy diet and tends to eat whatever he wants.  He reports that he eats biscuits, eggs, bacon and sausage each morning.  He does tend to eat out at fast food restaurants.  He denies any hypoglycemic episodes.  He brings in a record of his sugars this morning and his sugars before and after eating are usually running above 200.  His fasting sugars in the morning are in  the 160s.  He denies any lesions on his feet.  He denies any chest pain, shortness of breath, or swelling in his extremities.  He has been taking lisinopril for his blood pressure.  He reports he did take his blood pressure medication this morning.  He denies any palpitations.  He is urinating okay.  He reports he is not getting any blood work today.  He reports he does not like to have to pay for his medications and is only willing to take medicines that are on the generic list at [facility].  He reports he is not willing to see a diabetic nutritionist or see an endocrinologist.  He reports he has seen an endocrinologist in the past and it never helped with his blood sugars.  Able to verbalize and understand what his medication regimen for his diabetes should be.  He is not interested in counting carbohydrates.     Past Medical History:  Diagnosis Date  . Anemia   . Arthritis   . Diabetes mellitus without complication (HCC)   . Hyperlipidemia   . Hypertension   . Kidney stones     Past Surgical History:  Procedure Laterality Date  . ANKLE  SURGERY    . HERNIA REPAIR     umbilical x1 Incisional x1  . INCISIONAL HERNIA REPAIR  04/13/2011   Procedure: LAPAROSCOPIC INCISIONAL HERNIA;  Surgeon: Dalia Heading, MD;  Location: [facility];  Service: General;  Laterality: N/A;  Recurrent Laparoscopic Incisional Herniorraphy with Mesh  . KIDNEY STONE SURGERY      Family History  Problem Relation Age of Onset  . Diabetes Father   . Heart failure Mother   . Cancer Other   . Heart attack Other   . Anesthesia problems Neg Hx   . Hypotension Neg Hx   . Malignant hyperthermia Neg Hx   . Pseudochol deficiency Neg Hx      Social History   Socioeconomic History  . Marital status: Married    Spouse name: Not on file  . Number of children: Not on file  . Years of education: Not on file  . Highest education level: Not on file  Occupational History  . Not on file  Social Needs  . Financial resource  strain: Not on file  . Food insecurity:    Worry: Not on file    Inability: Not on file  . Transportation needs:    Medical: Not on file    Non-medical: Not on file  Tobacco Use  . Smoking status: Never Smoker  . Smokeless tobacco: Current User    Types: Snuff  Substance and Sexual Activity  . Alcohol use: No  . Drug use: No  . Sexual activity: Yes    Birth control/protection: None  Lifestyle  . Physical activity:    Days per week: Not on file    Minutes per session: Not on file  . Stress: Not on file  Relationships  . Social connections:    Talks on phone: Not on file    Gets together: Not on file    Attends religious service: Not on file    Active member of club or organization: Not on file    Attends meetings of clubs or organizations: Not on file    Relationship status: Not on file  Other Topics Concern  . Not on file  Social History Narrative   Live in [place], [place].   Married for over 30 years.   Has 2 children, age 60/30s. Has one grandchild, 19 months.    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    Eats all food groups.   Wears seatbelt.    Dip 1 can a day.    Current Outpatient Medications on File Prior to Visit  Medication Sig Dispense Refill  . atorvastatin (LIPITOR) 40 MG tablet Take 1 tablet (40 mg total) by mouth daily. 90 tablet 3  . BD VEO INSULIN SYRINGE U/F 31G X 15/64" 1 ML MISC  USE AS DIRECTED 100 each 2  . fenofibrate (TRICOR) 145 MG tablet Take 1 tablet (145 mg total) by mouth daily. 30 tablet 3  . glipiZIDE (GLUCOTROL XL) 10 MG 24 hr tablet Take 1 tablet (10 mg total) by mouth daily with breakfast. 90 tablet 1  . glucose blood (ONETOUCH VERIO) test strip TEST twice a day 100 each 3  . Insulin Pen Needle (NOVOTWIST) 32G X 5 MM MISC Use two daily to inject Victoza and Toujeo. 90 each 5  . lisinopril (PRINIVIL,ZESTRIL) 40 MG tablet Take 1 tablet (40 mg total) by mouth daily. 90 tablet 3  . ONETOUCH DELICA LANCETS  33G MISC Use to check  blood sugar once a day dx code E11.65 50 each 3  . sildenafil (VIAGRA) 100 MG tablet Take 1 tablet (100 mg total) by mouth daily as needed for erectile dysfunction. 20 tablet 1   No current facility-administered medications on file prior to visit.     Review of Systems  Constitutional: Negative for appetite change, chills, fever and unexpected weight change.  HENT: Negative for trouble swallowing and voice change.   Eyes: Negative for visual disturbance.  Respiratory: Negative for cough, chest tightness, shortness of breath and wheezing.   Cardiovascular: Negative for chest pain, palpitations and leg swelling.  Gastrointestinal: Negative for abdominal pain, diarrhea, nausea and vomiting.  Genitourinary: Negative for decreased urine volume, dysuria and frequency.  Musculoskeletal: Negative for myalgias.  Skin: Negative for rash.  Neurological: Negative for dizziness, tremors, syncope, facial asymmetry, weakness and headaches.  Hematological: Negative for adenopathy. Does not bruise/bleed easily.  Psychiatric/Behavioral: Positive for agitation, decreased concentration, dysphoric mood and sleep disturbance. Negative for hallucinations, self-injury and suicidal ideas. The patient is nervous/anxious.      Objective:   BP (!) 154/86 (BP Location: Left Arm, Patient Position: Sitting, Cuff Size: Large)   Pulse 78   Temp 97.8 F (36.6 C) (Temporal)   Resp 12   Ht 6' (1.829 m)   Wt (!) 307 lb 0.6 oz (139.3 kg)   SpO2 97%   BMI 41.64 kg/m   Physical Exam  Constitutional: He is oriented to person, place, and time. He appears well-developed and well-nourished.  HENT:  Head: Normocephalic and atraumatic.  Eyes: Pupils are equal, round, and reactive to light. EOM are normal.  Neck: Normal range of motion. Neck supple. No thyromegaly present.  Cardiovascular: Normal rate, regular rhythm and normal heart sounds.  Pulmonary/Chest: Effort normal and breath sounds normal.    Musculoskeletal: He exhibits no edema.  Neurological: He is alert and oriented to person, place, and time. No cranial nerve deficit.  Skin: Skin is warm, dry and intact.  Psychiatric: His speech is normal and behavior is normal. Judgment and thought content normal. Cognition and memory are normal. He exhibits a depressed mood. He expresses no homicidal and no suicidal ideation. He expresses no suicidal plans and no homicidal plans.  Dysthymic/cramping mood.  Does have full range of affect.  No suicidal or homicidal ideations.  Thought process is goal-directed without delusions, phobias, obsessions, or compulsions.  Vitals reviewed.    Assessment and Plan   1. Type 2 diabetes mellitus with other specified complication, with long-term current use of insulin (HCC) At this time, we will go ahead and increase his Levemir to 34 units twice a day.  He is asked to please continue with the Humalog 15 units prior to meals.  He was counseled regarding his diet and checking his sugars prior to administering the Humalog.  If his sugars are less than 100 he would hold the Humalog.  He was counseled regarding worrisome signs and symptoms of hypo-and hyperglycemia.  He refuses referral to endocrinology.  We will plan to check his hemoglobin A1c in 2 to 3 months. - Insulin Detemir (LEVEMIR FLEXTOUCH) 100 UNIT/ML Pen; Inject 40 Units into the skin 2 (two) times daily.  Dispense: 30 mL; Refill: 1  2. Essential hypertension Taking lisinopril.  Add Norvasc as directed.  Recheck blood pressure in 1 month. - amLODipine (NORVASC) 10 MG tablet; Take 1 tablet (10 mg total) by mouth daily.  Dispense: 90 tablet; Refill: 3  3. Anxiety Start Celexa 20 mg p.o.  Daily. Patient counseled in detail regarding the risks of medication. Told to call or return to clinic if develop any worrisome signs or symptoms. Patient voiced understanding.  Suicide risks evaluated and documented in note if present or in the area  below.  Patient does not have/denies the following risks: previous suicide attempts, family history of suicide, access to lethal means, prior history of psychiatric disorder, history of alcohol or substance abuse disorder, recent loss of a loved one, or severe hopelessness. Patient denies access to firearms or if present will have removed from home/access.   Patient has protective factors of family and community support.  Patient reports that family believes is behaving rationally. Patient displays problem solving skills.   Patient specifically denies suicide ideation. Patient has access/information to healthcare contacts if situation or mood changes where patient is a risk to self or others or mood becomes unstable.   During the encounter, the patient had good eye contact and firm handshake regarding safety contract and agreement to seek help if mood worsens and not to harm self.   Patient understands the treatment plan and is in agreement. Agrees to keep follow up and call prior or return to clinic if needed.   - citalopram (CELEXA) 20 MG tablet; Take 1 tablet (20 mg total) by mouth daily.  Dispense: 30 tablet; Refill: 3  4. Mixed hyperlipidemia Continue current medications as directed.  Diet, exercise, and weight loss recommended.   Today we did discuss his diet.  We discussed trying to make improvements in his diet so that his blood sugars would be better.  We also discussed that his insulin regimen and the way he is using his insulin sporadically will not improve his overall quality of health nor will it decrease his risk of cardiovascular complications.  We discussed that his diet needs to be maintained and regular.  We discussed that he needs to decrease his carbohydrate intake.  He refuses referral to dietitian. Return in about 1 month (around 07/13/2017) for follow up. Aliene Beams, MD 06/15/2017

## 2017-06-15 NOTE — Patient Instructions (Addendum)
Increase the levemir to 34 units twice a day Check sugars and we can go up in about 4 days if needed. Start the norvasc/amlodipine for your BP. Take it in the am Start the citalapram for anxiety. Take it in the morning.   Check potassium and kidney function today.

## 2017-06-27 DIAGNOSIS — R69 Illness, unspecified: Secondary | ICD-10-CM | POA: Diagnosis not present

## 2017-06-30 ENCOUNTER — Other Ambulatory Visit: Payer: Self-pay | Admitting: Family Medicine

## 2017-06-30 ENCOUNTER — Other Ambulatory Visit: Payer: Self-pay

## 2017-06-30 DIAGNOSIS — E782 Mixed hyperlipidemia: Secondary | ICD-10-CM

## 2017-06-30 MED ORDER — FENOFIBRATE 145 MG PO TABS: 145.0000 mg | ORAL_TABLET | Freq: Every day | ORAL | 3 refills | Status: DC

## 2017-07-16 ENCOUNTER — Encounter: Payer: Self-pay | Admitting: Family Medicine

## 2017-07-16 ENCOUNTER — Ambulatory Visit (INDEPENDENT_AMBULATORY_CARE_PROVIDER_SITE_OTHER): Payer: Medicare HMO | Admitting: Family Medicine

## 2017-07-16 ENCOUNTER — Other Ambulatory Visit: Payer: Self-pay

## 2017-07-16 VITALS — BP 138/80 | HR 78 | Temp 98.6°F | Resp 16 | Ht 72.0 in | Wt 310.1 lb

## 2017-07-16 DIAGNOSIS — Z794 Long term (current) use of insulin: Secondary | ICD-10-CM

## 2017-07-16 DIAGNOSIS — I10 Essential (primary) hypertension: Secondary | ICD-10-CM

## 2017-07-16 DIAGNOSIS — E1169 Type 2 diabetes mellitus with other specified complication: Secondary | ICD-10-CM

## 2017-07-16 DIAGNOSIS — F419 Anxiety disorder, unspecified: Secondary | ICD-10-CM

## 2017-07-16 DIAGNOSIS — R69 Illness, unspecified: Secondary | ICD-10-CM | POA: Diagnosis not present

## 2017-07-16 DIAGNOSIS — E782 Mixed hyperlipidemia: Secondary | ICD-10-CM

## 2017-07-16 MED ORDER — TRAZODONE HCL 50 MG PO TABS: 25.0000 mg | ORAL_TABLET | Freq: Every evening | ORAL | 3 refills | Status: DC | PRN

## 2017-07-16 MED ORDER — HYDROCHLOROTHIAZIDE 25 MG PO TABS: 25.0000 mg | ORAL_TABLET | Freq: Every day | ORAL | 3 refills | Status: DC

## 2017-07-16 NOTE — Patient Instructions (Signed)
Add HCTZ 25 mg in the morning for BP Increase the Levemir to 40 units twice a day Add the trazodone 50 mg at bedtime. Stop/stay off the celexa.   LOOSE WEIGHT.  You can do IT.

## 2017-07-16 NOTE — Progress Notes (Signed)
Patient ID: Troy Randall, male    DOB: 1960/07/17, 57 y.o.   MRN: [id number]  Chief Complaint  Patient presents with  . Diabetes    follow up    Allergies Metformin and related and Penicillins  Subjective:   Troy Randall is a 57 y.o. male who presents to [facility] today.  HPI Here for follow up. Reports that has not taken the celexa in 2 weeks b/c it caused him to have some diarrhea. Would be willing take a medication that would help him sleep.  Reports that did not have other side effects with the medication.  Does not feel down, depressed, or hopeless.  Reports that he gets more irritable when he cannot sleep.  Reports that his mind tends to race at night and has difficulty falling asleep.  Would like a medication that is not very expensive.  Has increased the Levemir and has been on Levemir 34 units twice a day.  Also uses the Humalog 15 units, the number of times that he uses on a daily basis is not consistent. Reports that sugars have been running in the 140-200 depending on what he eats. Has not had any hypoglycemic episodes.  Reports that he has gained some weight since he was last year because he has had some dietary indiscretion.  Reports that over the past several days he has felt committed to working on his diet.  Is been trying to eat more salads and proteins rather than eating so many carbohydrates.  Reports that he understands he needs to lose weight because he is gotten so big in his belly.  Has cut down on eating and drinking so many sweets.  Has not had any hypoglycemic episodes.  No lesions on his feet.  Does not want to go to diabetic education.  Does not want to try any other oral medications due to cost.  Refuses to try metformin again.  Denies any chest pain, shortness of breath, swelling in his extremities.  Taking the Glucotrol XL at 10 mg.  He is not having any problems with this medication.  Did not have any side effects with the Norvasc.  Has  been on the lisinopril 40 mg a day, Norvasc 10 mg a day.  Blood pressure still running in the 140s over 80s.  Feels better.  Feels that his blood pressure is lower.  Has never been on a fluid pill.  Is taking his cholesterol medications.  Refuses to get any lab work done today.  Will consider getting it done at his next visit.   Past Medical History:  Diagnosis Date  . Anemia   . Arthritis   . Diabetes mellitus without complication (HCC)   . Hyperlipidemia   . Hypertension   . Kidney stones     Past Surgical History:  Procedure Laterality Date  . ANKLE SURGERY    . HERNIA REPAIR     umbilical x1 Incisional x1  . INCISIONAL HERNIA REPAIR  04/13/2011   Procedure: LAPAROSCOPIC INCISIONAL HERNIA;  Surgeon: Dalia Heading, MD;  Location: [facility];  Service: General;  Laterality: N/A;  Recurrent Laparoscopic Incisional Herniorraphy with Mesh  . KIDNEY STONE SURGERY      Family History  Problem Relation Age of Onset  . Diabetes Father   . Heart failure Mother   . Cancer Other   . Heart attack Other   . Anesthesia problems Neg Hx   . Hypotension Neg Hx   . Malignant  hyperthermia Neg Hx   . Pseudochol deficiency Neg Hx      Social History   Socioeconomic History  . Marital status: Married    Spouse name: Not on file  . Number of children: Not on file  . Years of education: Not on file  . Highest education level: Not on file  Occupational History  . Not on file  Social Needs  . Financial resource strain: Not on file  . Food insecurity:    Worry: Not on file    Inability: Not on file  . Transportation needs:    Medical: Not on file    Non-medical: Not on file  Tobacco Use  . Smoking status: Never Smoker  . Smokeless tobacco: Current User    Types: Snuff  Substance and Sexual Activity  . Alcohol use: No  . Drug use: No  . Sexual activity: Yes    Birth control/protection: None  Lifestyle  . Physical activity:    Days per week: Not on file    Minutes per session: Not  on file  . Stress: Not on file  Relationships  . Social connections:    Talks on phone: Not on file    Gets together: Not on file    Attends religious service: Not on file    Active member of club or organization: Not on file    Attends meetings of clubs or organizations: Not on file    Relationship status: Not on file  Other Topics Concern  . Not on file  Social History Narrative   Live in [place], [place].   Married for over 30 years.   Has 2 children, age 20/30s. Has one grandchild, 19 months.    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    Eats all food groups.   Wears seatbelt.    Dip 1 can a day.    Current Outpatient Medications on File Prior to Visit  Medication Sig Dispense Refill  . amLODipine (NORVASC) 10 MG tablet Take 1 tablet (10 mg total) by mouth daily. 90 tablet 3  . atorvastatin (LIPITOR) 40 MG tablet Take 1 tablet (40 mg total) by mouth daily. 90 tablet 3  . BD VEO INSULIN SYRINGE U/F 31G X 15/64" 1 ML MISC  USE AS DIRECTED 100 each 2  . fenofibrate (TRICOR) 145 MG tablet Take 1 tablet (145 mg total) by mouth daily. 30 tablet 3  . glipiZIDE (GLUCOTROL XL) 10 MG 24 hr tablet Take 1 tablet (10 mg total) by mouth daily with breakfast. 90 tablet 1  . glucose blood (ONETOUCH VERIO) test strip TEST twice a day 100 each 3  . Insulin Detemir (LEVEMIR FLEXTOUCH) 100 UNIT/ML Pen Inject 40 Units into the skin 2 (two) times daily. 30 mL 1  . Insulin Pen Needle (NOVOTWIST) 32G X 5 MM MISC Use two daily to inject Victoza and Toujeo. 90 each 5  . lisinopril (PRINIVIL,ZESTRIL) 40 MG tablet Take 1 tablet (40 mg total) by mouth daily. 90 tablet 3  . ONETOUCH DELICA LANCETS 33G MISC Use to check blood sugar once a day dx code E11.65 50 each 3  . sildenafil (VIAGRA) 100 MG tablet Take 1 tablet (100 mg total) by mouth daily as needed for erectile dysfunction. 20 tablet 1   No current facility-administered medications on file prior to visit.      Review of Systems  Constitutional: Negative for appetite change, chills, fever and unexpected weight change.  Eyes: Negative for visual disturbance.  Respiratory: Negative for cough, chest tightness, shortness of breath and wheezing.   Cardiovascular: Negative for chest pain, palpitations and leg swelling.  Gastrointestinal: Negative for abdominal pain, diarrhea, nausea and vomiting.  Endocrine: Negative for polyphagia and polyuria.  Genitourinary: Negative for decreased urine volume, dysuria and frequency.  Musculoskeletal: Negative for myalgias.  Skin: Negative for rash.  Neurological: Negative for dizziness, tremors, syncope, facial asymmetry, weakness, light-headedness, numbness and headaches.  Hematological: Negative for adenopathy. Does not bruise/bleed easily.  Psychiatric/Behavioral: Positive for sleep disturbance.     Objective:   BP 138/80 (BP Location: Left Arm, Patient Position: Sitting, Cuff Size: Large)   Pulse 78   Temp 98.6 F (37 C) (Temporal)   Resp 16   Ht 6' (1.829 m)   Wt (!) 310 lb 1.3 oz (140.7 kg)   SpO2 99%   BMI 42.05 kg/m   Physical Exam  Constitutional: He is oriented to person, place, and time. He appears well-developed and well-nourished.  HENT:  Head: Normocephalic and atraumatic.  Eyes: Pupils are equal, round, and reactive to light. Conjunctivae and EOM are normal.  Cardiovascular: Normal rate, regular rhythm and normal heart sounds.  Pulmonary/Chest: Effort normal and breath sounds normal.  Abdominal: Soft. Bowel sounds are normal.  obese  Neurological: He is alert and oriented to person, place, and time.  Skin: Skin is warm and dry.  Psychiatric: He has a normal mood and affect. His behavior is normal. Judgment and thought content normal.  Vitals reviewed.  Depression screen [facility] 2/9 07/16/2017 06/15/2017 05/12/2017 03/31/2017  Decreased Interest 2 2 1  0  Down, Depressed, Hopeless 1 2 1  0  PHQ - 2 Score 3 4 2  0  Altered sleeping 3 3 3   -  Tired, decreased energy 3 1 3  -  Change in appetite 0 1 0 -  Feeling bad or failure about yourself  1 3 1  -  Trouble concentrating 1 2 1  -  Moving slowly or fidgety/restless 1 2 1  -  Suicidal thoughts 0 0 0 -  PHQ-9 Score 12 16 11  -  Difficult doing work/chores Not difficult at all Somewhat difficult Somewhat difficult -    Assessment and Plan  1. Type 2 diabetes mellitus with other specified complication, with long-term current use of insulin (HCC) Increase the Levemir to 40 units twice a day. Consistency with sliding scale insulin was recommended and asked to document and bring into next visit.  Dietary modifications and recommendations discussed with patient.  He defers diabetic education at this time. Diabetic foot care discussed. Exercise modifications recommended. Patient due for hemoglobin A1c but wishes to wait and get labs at next visit.  2. Anxiety Anxiety and irritability which is worse in the p.m. hours.  Will try trazodone 50 mg p.o. nightly.  Patient was counseled regarding worrisome signs and symptoms.  He was told if he develops any problems with his mood to call or return to clinic.  He will keep his scheduled follow-up in 1 month. - traZODone (DESYREL) 50 MG tablet; Take 0.5-1 tablets (25-50 mg total) by mouth at bedtime as needed for sleep.  Dispense: 30 tablet; Refill: 3  3. Essential hypertension Continue Norvasc and lisinopril.  Add HCTZ.  Will need to check BMP at follow-up. - hydrochlorothiazide (HYDRODIURIL) 25 MG tablet; Take 1 tablet (25 mg total) by mouth daily.  Dispense: 90 tablet; Refill: 3  4.  Hyperlipidemia Check FLP at follow-up and LFTs.  Dietary modifications discussed.  Discussed  that his last lab check his triglycerides were extremely high.  He is not having any abdominal pain or side effects.  He is not having any myalgias with the medication.  He was told to call with any questions or concerns.  He was asked to come fasting to his next lab  visit. Return in about 1 month (around 08/13/2017) for BP. Aliene Beams, MD 07/16/2017

## 2017-08-02 ENCOUNTER — Telehealth: Payer: Self-pay | Admitting: Family Medicine

## 2017-08-02 NOTE — Telephone Encounter (Signed)
Called patient to discuss message he left. Number busy.

## 2017-08-02 NOTE — Telephone Encounter (Signed)
Patient called in before lunch that he and Dr.Hagler discussed a fluid pill at his last office visit but he did not think he needed it. However now his feet are swelling pretty bad so he is requesting a fluid pill to help. Says this has never happened before. He uses [facility] [place]

## 2017-08-04 DIAGNOSIS — M25561 Pain in right knee: Secondary | ICD-10-CM | POA: Diagnosis not present

## 2017-08-04 DIAGNOSIS — M25552 Pain in left hip: Secondary | ICD-10-CM | POA: Diagnosis not present

## 2017-08-04 NOTE — Telephone Encounter (Signed)
Dr hagler prescribed hctz and I called the pharmacy and they will get it ready for him to collect. They had put it back on the shelf bc the patient never picked it up. Pt aware

## 2017-08-04 NOTE — Telephone Encounter (Signed)
Pt is calling back, as he has not heard anything, I advised that Dr Tracie Harrier was out of the office for the week. Please call the PT

## 2017-08-09 DIAGNOSIS — R69 Illness, unspecified: Secondary | ICD-10-CM | POA: Diagnosis not present

## 2017-08-09 DIAGNOSIS — M47816 Spondylosis without myelopathy or radiculopathy, lumbar region: Secondary | ICD-10-CM | POA: Diagnosis not present

## 2017-08-10 DIAGNOSIS — R69 Illness, unspecified: Secondary | ICD-10-CM | POA: Diagnosis not present

## 2017-08-27 ENCOUNTER — Encounter: Payer: Self-pay | Admitting: Family Medicine

## 2017-08-27 ENCOUNTER — Other Ambulatory Visit: Payer: Self-pay

## 2017-08-27 ENCOUNTER — Ambulatory Visit (INDEPENDENT_AMBULATORY_CARE_PROVIDER_SITE_OTHER): Payer: Medicare HMO | Admitting: Family Medicine

## 2017-08-27 VITALS — BP 118/72 | HR 88 | Temp 98.5°F | Resp 12 | Ht 72.0 in | Wt 308.4 lb

## 2017-08-27 DIAGNOSIS — Z794 Long term (current) use of insulin: Secondary | ICD-10-CM

## 2017-08-27 DIAGNOSIS — E782 Mixed hyperlipidemia: Secondary | ICD-10-CM

## 2017-08-27 DIAGNOSIS — E1169 Type 2 diabetes mellitus with other specified complication: Secondary | ICD-10-CM | POA: Diagnosis not present

## 2017-08-27 DIAGNOSIS — I10 Essential (primary) hypertension: Secondary | ICD-10-CM | POA: Diagnosis not present

## 2017-08-27 DIAGNOSIS — Z23 Encounter for immunization: Secondary | ICD-10-CM | POA: Diagnosis not present

## 2017-08-27 NOTE — Patient Instructions (Signed)
Claritin (loratidine)

## 2017-08-27 NOTE — Progress Notes (Signed)
Patient ID: Troy Randall, male    DOB: December 13, 1960, 57 y.o.   MRN: [id number]  Chief Complaint  Patient presents with  . Hypertension    follow up  . Diabetes    Allergies Metformin and related and Penicillins  Subjective:   Troy Randall is a 57 y.o. male who presents to [facility] today.  HPI Here for follow up of BP and DM.  Reports that his blood pressure has been much better.  Started the fluid pill after his last visit.  Has not had any side effects.  Denies any swelling in his extremities, muscle aches, chest pain, shortness of breath, palpitations.  Reports that since going up on his long-acting insulin and being more consistent with his mealtime insulin that his blood sugars are running much better.  His fasting sugars in the morning are running around 99.  2 hours after eating his sugars are less than 200.  He reports this is a drastic improvement.  He reports he has been more compliant with taking all his medication.  He denies any lesions on his feet.  Has not been to the eye doctor.  Does not want his feet checked today.  Has not had any hypoglycemic episodes.  Has not really lost any weight.  Does not want to get any blood work done.  Has an upcoming visit next week with his new PCP, Dr. Dwana Randall. He reports that he is doing much better than he was and does not have any complaints today.   Past Medical History:  Diagnosis Date  . Anemia   . Arthritis   . Diabetes mellitus without complication (HCC)   . Hyperlipidemia   . Hypertension   . Kidney stones     Past Surgical History:  Procedure Laterality Date  . ANKLE SURGERY    . HERNIA REPAIR     umbilical x1 Incisional x1  . INCISIONAL HERNIA REPAIR  04/13/2011   Procedure: LAPAROSCOPIC INCISIONAL HERNIA;  Surgeon: Dalia Heading, MD;  Location: [facility];  Service: General;  Laterality: N/A;  Recurrent Laparoscopic Incisional Herniorraphy with Mesh  . KIDNEY STONE SURGERY      Family History    Problem Relation Age of Onset  . Diabetes Father   . Heart failure Mother   . Cancer Other   . Heart attack Other   . Anesthesia problems Neg Hx   . Hypotension Neg Hx   . Malignant hyperthermia Neg Hx   . Pseudochol deficiency Neg Hx      Social History   Socioeconomic History  . Marital status: Married    Spouse name: Not on file  . Number of children: Not on file  . Years of education: Not on file  . Highest education level: Not on file  Occupational History  . Not on file  Social Needs  . Financial resource strain: Not on file  . Food insecurity:    Worry: Not on file    Inability: Not on file  . Transportation needs:    Medical: Not on file    Non-medical: Not on file  Tobacco Use  . Smoking status: Never Smoker  . Smokeless tobacco: Current User    Types: Snuff  Substance and Sexual Activity  . Alcohol use: No  . Drug use: No  . Sexual activity: Yes    Birth control/protection: None  Lifestyle  . Physical activity:    Days per week: Not on file  Minutes per session: Not on file  . Stress: Not on file  Relationships  . Social connections:    Talks on phone: Not on file    Gets together: Not on file    Attends religious service: Not on file    Active member of club or organization: Not on file    Attends meetings of clubs or organizations: Not on file    Relationship status: Not on file  Other Topics Concern  . Not on file  Social History Narrative   Live in [place], [place].   Married for over 30 years.   Has 2 children, age 55/30s. Has one grandchild, 19 months.    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    Eats all food groups.   Wears seatbelt.    Dip 1 can a day.     Review of Systems  Constitutional: Negative for appetite change, chills, fever and unexpected weight change.  HENT: Negative for trouble swallowing and voice change.   Eyes: Negative for visual disturbance.  Respiratory: Negative for cough,  chest tightness, shortness of breath and wheezing.   Cardiovascular: Negative for chest pain, palpitations and leg swelling.  Gastrointestinal: Negative for abdominal pain, diarrhea, nausea and vomiting.  Genitourinary: Negative for decreased urine volume, dysuria and frequency.  Skin: Negative for rash.  Neurological: Negative for dizziness, tremors, syncope, facial asymmetry, weakness and headaches.  Hematological: Negative for adenopathy. Does not bruise/bleed easily.  Psychiatric/Behavioral: Negative for dysphoric mood and self-injury. The patient is not nervous/anxious.    Current Outpatient Medications on File Prior to Visit  Medication Sig Dispense Refill  . amLODipine (NORVASC) 10 MG tablet Take 1 tablet (10 mg total) by mouth daily. 90 tablet 3  . atorvastatin (LIPITOR) 40 MG tablet Take 1 tablet (40 mg total) by mouth daily. 90 tablet 3  . BD VEO INSULIN SYRINGE U/F 31G X 15/64" 1 ML MISC  USE AS DIRECTED 100 each 2  . fenofibrate (TRICOR) 145 MG tablet Take 1 tablet (145 mg total) by mouth daily. 30 tablet 3  . glipiZIDE (GLUCOTROL XL) 10 MG 24 hr tablet Take 1 tablet (10 mg total) by mouth daily with breakfast. 90 tablet 1  . glucose blood (ONETOUCH VERIO) test strip TEST twice a day 100 each 3  . hydrochlorothiazide (HYDRODIURIL) 25 MG tablet Take 1 tablet (25 mg total) by mouth daily. 90 tablet 3  . Insulin Detemir (LEVEMIR FLEXTOUCH) 100 UNIT/ML Pen Inject 40 Units into the skin 2 (two) times daily. 30 mL 1  . Insulin Pen Needle (NOVOTWIST) 32G X 5 MM MISC Use two daily to inject Victoza and Toujeo. 90 each 5  . lisinopril (PRINIVIL,ZESTRIL) 40 MG tablet Take 1 tablet (40 mg total) by mouth daily. 90 tablet 3  . ONETOUCH DELICA LANCETS 33G MISC Use to check blood sugar once a day dx code E11.65 50 each 3  . sildenafil (VIAGRA) 100 MG tablet Take 1 tablet (100 mg total) by mouth daily as needed for erectile dysfunction. 20 tablet 1  . traZODone (DESYREL) 50 MG tablet Take 0.5-1  tablets (25-50 mg total) by mouth at bedtime as needed for sleep. 30 tablet 3   No current facility-administered medications on file prior to visit.     Objective:   BP 118/72 (BP Location: Right Arm, Patient Position: Sitting, Cuff Size: Large)   Pulse 88   Temp 98.5 F (36.9 C) (Temporal)   Resp 12  Ht 6' (1.829 m)   Wt (!) 308 lb 6.4 oz (139.9 kg)   SpO2 96% Comment: room air  BMI 41.83 kg/m   Physical Exam  Constitutional: He is oriented to person, place, and time. He appears well-developed and well-nourished.  HENT:  Head: Normocephalic and atraumatic.  Eyes: Pupils are equal, round, and reactive to light. EOM are normal.  Neck: Normal range of motion. Neck supple. No thyromegaly present.  Cardiovascular: Normal rate, regular rhythm and normal heart sounds.  Pulmonary/Chest: Effort normal and breath sounds normal. He has no wheezes.  Musculoskeletal: He exhibits no edema.  Neurological: He is alert and oriented to person, place, and time. No cranial nerve deficit.  Skin: Skin is warm, dry and intact.  Psychiatric: He has a normal mood and affect. His behavior is normal. Thought content normal.  Vitals reviewed.  Depression screen [facility] 2/9 08/27/2017 07/16/2017 06/15/2017 05/12/2017 03/31/2017  Decreased Interest 0 2 2 1  0  Down, Depressed, Hopeless 0 1 2 1  0  PHQ - 2 Score 0 3 4 2  0  Altered sleeping 3 3 3 3  -  Tired, decreased energy 2 3 1 3  -  Change in appetite 0 0 1 0 -  Feeling bad or failure about yourself  0 1 3 1  -  Trouble concentrating 3 1 2 1  -  Moving slowly or fidgety/restless 0 1 2 1  -  Suicidal thoughts 0 0 0 0 -  PHQ-9 Score 8 12 16 11  -  Difficult doing work/chores - Not difficult at all Somewhat difficult Somewhat difficult -    Assessment and Plan  1. Type 2 diabetes mellitus with other specified complication, with long-term current use of insulin (HCC) Continue medications as directed.  Patient defers any blood work today.  Diet, exercise, and  weight loss modifications recommended.  He did not bring his blood sugar log today, but good blood sugar control is reported.  Foot care discussed.  Defers foot exam. - Ambulatory referral to Ophthalmology  2. Essential hypertension Blood pressure controlled.  Does need a BMP done.  Reports will get done next week at new PCP office.  3. Mixed hyperlipidemia Discussed that he needs his cholesterol rechecked.  Labs from 4 months ago revealed elevated cholesterol.  He reports he has been taking the Lipitor as directed.  Continue dietary modifications and weight loss recommended.  4. Need for immunization against influenza Vaccination given. - Flu Vaccine QUAD 36+ mos IM Keep scheduled follow-up with new PCP office. No follow-ups on file. Aliene Beams, MD 08/27/2017

## 2017-09-02 DIAGNOSIS — M47816 Spondylosis without myelopathy or radiculopathy, lumbar region: Secondary | ICD-10-CM | POA: Diagnosis not present

## 2017-09-08 ENCOUNTER — Telehealth: Payer: Self-pay | Admitting: Family Medicine

## 2017-09-08 NOTE — Telephone Encounter (Signed)
Called patient back. Rang once and then went to busy signal. Will try again tomorrow.

## 2017-09-08 NOTE — Telephone Encounter (Signed)
Patient called and requesting that you call him back.  He would not give me any other information.   I told him I may be able to take care of what ever he needs and he would not tell me anything.

## 2017-09-09 NOTE — Telephone Encounter (Signed)
Called patient to try and find out what he needed assistance with. Phone rang once and went to busy signal. Will try again later.

## 2017-09-15 DIAGNOSIS — E785 Hyperlipidemia, unspecified: Secondary | ICD-10-CM | POA: Diagnosis not present

## 2017-09-15 DIAGNOSIS — E782 Mixed hyperlipidemia: Secondary | ICD-10-CM | POA: Diagnosis not present

## 2017-09-15 DIAGNOSIS — Z125 Encounter for screening for malignant neoplasm of prostate: Secondary | ICD-10-CM | POA: Diagnosis not present

## 2017-09-15 DIAGNOSIS — E1165 Type 2 diabetes mellitus with hyperglycemia: Secondary | ICD-10-CM | POA: Diagnosis not present

## 2017-09-15 DIAGNOSIS — G4700 Insomnia, unspecified: Secondary | ICD-10-CM | POA: Diagnosis not present

## 2017-09-15 DIAGNOSIS — I10 Essential (primary) hypertension: Secondary | ICD-10-CM | POA: Diagnosis not present

## 2017-09-15 DIAGNOSIS — Z6841 Body Mass Index (BMI) 40.0 and over, adult: Secondary | ICD-10-CM | POA: Diagnosis not present

## 2017-09-15 DIAGNOSIS — R809 Proteinuria, unspecified: Secondary | ICD-10-CM | POA: Diagnosis not present

## 2017-09-15 DIAGNOSIS — E1159 Type 2 diabetes mellitus with other circulatory complications: Secondary | ICD-10-CM | POA: Diagnosis not present

## 2017-09-15 NOTE — Telephone Encounter (Signed)
Called patient to try and see what he needed/wanted to discuss. No answer. Phone went to busy signal after 1 ring. 3rd attempt at trying to reach patient.

## 2017-09-18 ENCOUNTER — Other Ambulatory Visit: Payer: Self-pay | Admitting: Family Medicine

## 2017-09-18 DIAGNOSIS — E1169 Type 2 diabetes mellitus with other specified complication: Secondary | ICD-10-CM

## 2017-09-18 DIAGNOSIS — Z794 Long term (current) use of insulin: Principal | ICD-10-CM

## 2017-09-22 DIAGNOSIS — J019 Acute sinusitis, unspecified: Secondary | ICD-10-CM | POA: Diagnosis not present

## 2017-09-22 DIAGNOSIS — I10 Essential (primary) hypertension: Secondary | ICD-10-CM | POA: Diagnosis not present

## 2017-09-22 DIAGNOSIS — G4700 Insomnia, unspecified: Secondary | ICD-10-CM | POA: Diagnosis not present

## 2017-09-22 DIAGNOSIS — Z6841 Body Mass Index (BMI) 40.0 and over, adult: Secondary | ICD-10-CM | POA: Diagnosis not present

## 2017-09-22 DIAGNOSIS — E1165 Type 2 diabetes mellitus with hyperglycemia: Secondary | ICD-10-CM | POA: Diagnosis not present

## 2017-09-22 DIAGNOSIS — E782 Mixed hyperlipidemia: Secondary | ICD-10-CM | POA: Diagnosis not present

## 2017-09-22 DIAGNOSIS — Z0001 Encounter for general adult medical examination with abnormal findings: Secondary | ICD-10-CM | POA: Diagnosis not present

## 2017-09-29 DIAGNOSIS — R69 Illness, unspecified: Secondary | ICD-10-CM | POA: Diagnosis not present

## 2017-10-17 ENCOUNTER — Other Ambulatory Visit: Payer: Self-pay | Admitting: Family Medicine

## 2017-10-17 DIAGNOSIS — E1165 Type 2 diabetes mellitus with hyperglycemia: Secondary | ICD-10-CM

## 2017-11-08 ENCOUNTER — Other Ambulatory Visit: Payer: Self-pay | Admitting: Family Medicine

## 2017-11-08 DIAGNOSIS — E782 Mixed hyperlipidemia: Secondary | ICD-10-CM

## 2017-11-16 ENCOUNTER — Other Ambulatory Visit: Payer: Self-pay | Admitting: Family Medicine

## 2017-11-18 DIAGNOSIS — R69 Illness, unspecified: Secondary | ICD-10-CM | POA: Diagnosis not present

## 2017-12-06 DIAGNOSIS — M5416 Radiculopathy, lumbar region: Secondary | ICD-10-CM | POA: Diagnosis not present

## 2017-12-20 ENCOUNTER — Other Ambulatory Visit: Payer: Self-pay | Admitting: Physical Medicine and Rehabilitation

## 2017-12-20 DIAGNOSIS — M5441 Lumbago with sciatica, right side: Secondary | ICD-10-CM

## 2017-12-21 ENCOUNTER — Ambulatory Visit
Admission: RE | Admit: 2017-12-21 | Discharge: 2017-12-21 | Disposition: A | Discharge status: Home / Self Care | Payer: Medicare HMO | Source: Ambulatory Visit | Attending: Physical Medicine and Rehabilitation | Admitting: Physical Medicine and Rehabilitation

## 2017-12-21 DIAGNOSIS — M545 Low back pain: Secondary | ICD-10-CM | POA: Diagnosis not present

## 2017-12-21 DIAGNOSIS — M5441 Lumbago with sciatica, right side: Secondary | ICD-10-CM

## 2018-01-03 DIAGNOSIS — R69 Illness, unspecified: Secondary | ICD-10-CM | POA: Diagnosis not present

## 2018-01-04 DIAGNOSIS — R69 Illness, unspecified: Secondary | ICD-10-CM | POA: Diagnosis not present

## 2018-01-21 DIAGNOSIS — E782 Mixed hyperlipidemia: Secondary | ICD-10-CM | POA: Diagnosis not present

## 2018-01-21 DIAGNOSIS — E1165 Type 2 diabetes mellitus with hyperglycemia: Secondary | ICD-10-CM | POA: Diagnosis not present

## 2018-01-21 DIAGNOSIS — Z6841 Body Mass Index (BMI) 40.0 and over, adult: Secondary | ICD-10-CM | POA: Diagnosis not present

## 2018-01-21 DIAGNOSIS — I10 Essential (primary) hypertension: Secondary | ICD-10-CM | POA: Diagnosis not present

## 2018-01-28 DIAGNOSIS — G4700 Insomnia, unspecified: Secondary | ICD-10-CM | POA: Diagnosis not present

## 2018-01-28 DIAGNOSIS — N529 Male erectile dysfunction, unspecified: Secondary | ICD-10-CM | POA: Diagnosis not present

## 2018-01-28 DIAGNOSIS — R809 Proteinuria, unspecified: Secondary | ICD-10-CM | POA: Diagnosis not present

## 2018-01-28 DIAGNOSIS — N182 Chronic kidney disease, stage 2 (mild): Secondary | ICD-10-CM | POA: Diagnosis not present

## 2018-01-28 DIAGNOSIS — E1165 Type 2 diabetes mellitus with hyperglycemia: Secondary | ICD-10-CM | POA: Diagnosis not present

## 2018-01-28 DIAGNOSIS — E782 Mixed hyperlipidemia: Secondary | ICD-10-CM | POA: Diagnosis not present

## 2018-01-28 DIAGNOSIS — I10 Essential (primary) hypertension: Secondary | ICD-10-CM | POA: Diagnosis not present

## 2018-01-28 DIAGNOSIS — Z6841 Body Mass Index (BMI) 40.0 and over, adult: Secondary | ICD-10-CM | POA: Diagnosis not present

## 2018-01-28 DIAGNOSIS — E1122 Type 2 diabetes mellitus with diabetic chronic kidney disease: Secondary | ICD-10-CM | POA: Diagnosis not present

## 2018-02-16 DIAGNOSIS — R69 Illness, unspecified: Secondary | ICD-10-CM | POA: Diagnosis not present

## 2018-03-03 DIAGNOSIS — J0140 Acute pansinusitis, unspecified: Secondary | ICD-10-CM | POA: Diagnosis not present

## 2018-04-07 DIAGNOSIS — R69 Illness, unspecified: Secondary | ICD-10-CM | POA: Diagnosis not present

## 2018-05-27 DIAGNOSIS — I10 Essential (primary) hypertension: Secondary | ICD-10-CM | POA: Diagnosis not present

## 2018-05-27 DIAGNOSIS — E782 Mixed hyperlipidemia: Secondary | ICD-10-CM | POA: Diagnosis not present

## 2018-05-27 DIAGNOSIS — E1165 Type 2 diabetes mellitus with hyperglycemia: Secondary | ICD-10-CM | POA: Diagnosis not present

## 2018-05-31 DIAGNOSIS — R69 Illness, unspecified: Secondary | ICD-10-CM | POA: Diagnosis not present

## 2018-06-03 DIAGNOSIS — E782 Mixed hyperlipidemia: Secondary | ICD-10-CM | POA: Diagnosis not present

## 2018-06-03 DIAGNOSIS — N529 Male erectile dysfunction, unspecified: Secondary | ICD-10-CM | POA: Diagnosis not present

## 2018-06-03 DIAGNOSIS — I10 Essential (primary) hypertension: Secondary | ICD-10-CM | POA: Diagnosis not present

## 2018-06-03 DIAGNOSIS — G4700 Insomnia, unspecified: Secondary | ICD-10-CM | POA: Diagnosis not present

## 2018-06-03 DIAGNOSIS — E1165 Type 2 diabetes mellitus with hyperglycemia: Secondary | ICD-10-CM | POA: Diagnosis not present

## 2018-07-26 DIAGNOSIS — R69 Illness, unspecified: Secondary | ICD-10-CM | POA: Diagnosis not present

## 2018-09-03 DIAGNOSIS — R69 Illness, unspecified: Secondary | ICD-10-CM | POA: Diagnosis not present

## 2018-10-27 DIAGNOSIS — Z23 Encounter for immunization: Secondary | ICD-10-CM | POA: Diagnosis not present

## 2018-10-31 DIAGNOSIS — R69 Illness, unspecified: Secondary | ICD-10-CM | POA: Diagnosis not present

## 2018-12-09 DIAGNOSIS — E1122 Type 2 diabetes mellitus with diabetic chronic kidney disease: Secondary | ICD-10-CM | POA: Diagnosis not present

## 2018-12-09 DIAGNOSIS — E1165 Type 2 diabetes mellitus with hyperglycemia: Secondary | ICD-10-CM | POA: Diagnosis not present

## 2018-12-09 DIAGNOSIS — E782 Mixed hyperlipidemia: Secondary | ICD-10-CM | POA: Diagnosis not present

## 2018-12-09 DIAGNOSIS — N529 Male erectile dysfunction, unspecified: Secondary | ICD-10-CM | POA: Diagnosis not present

## 2018-12-09 DIAGNOSIS — I10 Essential (primary) hypertension: Secondary | ICD-10-CM | POA: Diagnosis not present

## 2018-12-09 DIAGNOSIS — N182 Chronic kidney disease, stage 2 (mild): Secondary | ICD-10-CM | POA: Diagnosis not present

## 2018-12-20 DIAGNOSIS — R69 Illness, unspecified: Secondary | ICD-10-CM | POA: Diagnosis not present

## 2019-01-13 DIAGNOSIS — E1165 Type 2 diabetes mellitus with hyperglycemia: Secondary | ICD-10-CM | POA: Diagnosis not present

## 2019-01-13 DIAGNOSIS — E782 Mixed hyperlipidemia: Secondary | ICD-10-CM | POA: Diagnosis not present

## 2019-01-13 DIAGNOSIS — N529 Male erectile dysfunction, unspecified: Secondary | ICD-10-CM | POA: Diagnosis not present

## 2019-01-13 DIAGNOSIS — I10 Essential (primary) hypertension: Secondary | ICD-10-CM | POA: Diagnosis not present

## 2019-01-13 DIAGNOSIS — G4700 Insomnia, unspecified: Secondary | ICD-10-CM | POA: Diagnosis not present

## 2019-01-18 DIAGNOSIS — R69 Illness, unspecified: Secondary | ICD-10-CM | POA: Diagnosis not present

## 2019-01-31 DIAGNOSIS — R69 Illness, unspecified: Secondary | ICD-10-CM | POA: Diagnosis not present

## 2019-03-01 DIAGNOSIS — I480 Paroxysmal atrial fibrillation: Secondary | ICD-10-CM | POA: Diagnosis not present

## 2019-03-01 DIAGNOSIS — I10 Essential (primary) hypertension: Secondary | ICD-10-CM | POA: Diagnosis not present

## 2019-03-01 DIAGNOSIS — R6884 Jaw pain: Secondary | ICD-10-CM | POA: Diagnosis not present

## 2019-03-08 DIAGNOSIS — I10 Essential (primary) hypertension: Secondary | ICD-10-CM | POA: Diagnosis not present

## 2019-03-08 DIAGNOSIS — E1165 Type 2 diabetes mellitus with hyperglycemia: Secondary | ICD-10-CM | POA: Diagnosis not present

## 2019-03-08 DIAGNOSIS — G4700 Insomnia, unspecified: Secondary | ICD-10-CM | POA: Diagnosis not present

## 2019-03-08 DIAGNOSIS — N529 Male erectile dysfunction, unspecified: Secondary | ICD-10-CM | POA: Diagnosis not present

## 2019-03-08 DIAGNOSIS — E782 Mixed hyperlipidemia: Secondary | ICD-10-CM | POA: Diagnosis not present

## 2019-03-22 DIAGNOSIS — R69 Illness, unspecified: Secondary | ICD-10-CM | POA: Diagnosis not present

## 2019-05-13 DIAGNOSIS — R69 Illness, unspecified: Secondary | ICD-10-CM | POA: Diagnosis not present

## 2019-05-23 DIAGNOSIS — N529 Male erectile dysfunction, unspecified: Secondary | ICD-10-CM | POA: Diagnosis not present

## 2019-05-23 DIAGNOSIS — G4700 Insomnia, unspecified: Secondary | ICD-10-CM | POA: Diagnosis not present

## 2019-05-23 DIAGNOSIS — I10 Essential (primary) hypertension: Secondary | ICD-10-CM | POA: Diagnosis not present

## 2019-05-23 DIAGNOSIS — E782 Mixed hyperlipidemia: Secondary | ICD-10-CM | POA: Diagnosis not present

## 2019-05-23 DIAGNOSIS — E1122 Type 2 diabetes mellitus with diabetic chronic kidney disease: Secondary | ICD-10-CM | POA: Diagnosis not present

## 2019-05-23 DIAGNOSIS — J019 Acute sinusitis, unspecified: Secondary | ICD-10-CM | POA: Diagnosis not present

## 2019-05-23 DIAGNOSIS — N182 Chronic kidney disease, stage 2 (mild): Secondary | ICD-10-CM | POA: Diagnosis not present

## 2019-05-23 DIAGNOSIS — R6884 Jaw pain: Secondary | ICD-10-CM | POA: Diagnosis not present

## 2019-05-23 DIAGNOSIS — E1165 Type 2 diabetes mellitus with hyperglycemia: Secondary | ICD-10-CM | POA: Diagnosis not present

## 2019-05-26 DIAGNOSIS — G4700 Insomnia, unspecified: Secondary | ICD-10-CM | POA: Diagnosis not present

## 2019-05-26 DIAGNOSIS — N529 Male erectile dysfunction, unspecified: Secondary | ICD-10-CM | POA: Diagnosis not present

## 2019-05-26 DIAGNOSIS — I10 Essential (primary) hypertension: Secondary | ICD-10-CM | POA: Diagnosis not present

## 2019-05-26 DIAGNOSIS — E782 Mixed hyperlipidemia: Secondary | ICD-10-CM | POA: Diagnosis not present

## 2019-05-26 DIAGNOSIS — Z6841 Body Mass Index (BMI) 40.0 and over, adult: Secondary | ICD-10-CM | POA: Diagnosis not present

## 2019-05-26 DIAGNOSIS — Z0001 Encounter for general adult medical examination with abnormal findings: Secondary | ICD-10-CM | POA: Diagnosis not present

## 2019-05-26 DIAGNOSIS — E1165 Type 2 diabetes mellitus with hyperglycemia: Secondary | ICD-10-CM | POA: Diagnosis not present

## 2019-06-27 DIAGNOSIS — R69 Illness, unspecified: Secondary | ICD-10-CM | POA: Diagnosis not present

## 2019-06-29 DIAGNOSIS — R69 Illness, unspecified: Secondary | ICD-10-CM | POA: Diagnosis not present

## 2019-08-14 DIAGNOSIS — R69 Illness, unspecified: Secondary | ICD-10-CM | POA: Diagnosis not present

## 2019-08-29 DIAGNOSIS — J069 Acute upper respiratory infection, unspecified: Secondary | ICD-10-CM | POA: Diagnosis not present

## 2019-08-29 DIAGNOSIS — E1122 Type 2 diabetes mellitus with diabetic chronic kidney disease: Secondary | ICD-10-CM | POA: Diagnosis not present

## 2019-08-29 DIAGNOSIS — G44019 Episodic cluster headache, not intractable: Secondary | ICD-10-CM | POA: Diagnosis not present

## 2019-09-22 DIAGNOSIS — E1165 Type 2 diabetes mellitus with hyperglycemia: Secondary | ICD-10-CM | POA: Diagnosis not present

## 2019-09-22 DIAGNOSIS — N182 Chronic kidney disease, stage 2 (mild): Secondary | ICD-10-CM | POA: Diagnosis not present

## 2019-09-22 DIAGNOSIS — N529 Male erectile dysfunction, unspecified: Secondary | ICD-10-CM | POA: Diagnosis not present

## 2019-09-22 DIAGNOSIS — Z125 Encounter for screening for malignant neoplasm of prostate: Secondary | ICD-10-CM | POA: Diagnosis not present

## 2019-09-22 DIAGNOSIS — R809 Proteinuria, unspecified: Secondary | ICD-10-CM | POA: Diagnosis not present

## 2019-09-22 DIAGNOSIS — E782 Mixed hyperlipidemia: Secondary | ICD-10-CM | POA: Diagnosis not present

## 2019-09-22 DIAGNOSIS — Z0001 Encounter for general adult medical examination with abnormal findings: Secondary | ICD-10-CM | POA: Diagnosis not present

## 2019-09-22 DIAGNOSIS — E1122 Type 2 diabetes mellitus with diabetic chronic kidney disease: Secondary | ICD-10-CM | POA: Diagnosis not present

## 2019-09-22 DIAGNOSIS — I10 Essential (primary) hypertension: Secondary | ICD-10-CM | POA: Diagnosis not present

## 2019-09-22 DIAGNOSIS — G4700 Insomnia, unspecified: Secondary | ICD-10-CM | POA: Diagnosis not present

## 2019-09-22 DIAGNOSIS — J019 Acute sinusitis, unspecified: Secondary | ICD-10-CM | POA: Diagnosis not present

## 2019-09-28 DIAGNOSIS — R69 Illness, unspecified: Secondary | ICD-10-CM | POA: Diagnosis not present

## 2019-09-29 DIAGNOSIS — G4700 Insomnia, unspecified: Secondary | ICD-10-CM | POA: Diagnosis not present

## 2019-09-29 DIAGNOSIS — E1165 Type 2 diabetes mellitus with hyperglycemia: Secondary | ICD-10-CM | POA: Diagnosis not present

## 2019-09-29 DIAGNOSIS — I10 Essential (primary) hypertension: Secondary | ICD-10-CM | POA: Diagnosis not present

## 2019-09-29 DIAGNOSIS — N529 Male erectile dysfunction, unspecified: Secondary | ICD-10-CM | POA: Diagnosis not present

## 2019-09-29 DIAGNOSIS — E782 Mixed hyperlipidemia: Secondary | ICD-10-CM | POA: Diagnosis not present

## 2019-09-29 DIAGNOSIS — Z6841 Body Mass Index (BMI) 40.0 and over, adult: Secondary | ICD-10-CM | POA: Diagnosis not present

## 2019-10-05 DIAGNOSIS — G4700 Insomnia, unspecified: Secondary | ICD-10-CM | POA: Diagnosis not present

## 2019-10-05 DIAGNOSIS — E1165 Type 2 diabetes mellitus with hyperglycemia: Secondary | ICD-10-CM | POA: Diagnosis not present

## 2019-10-05 DIAGNOSIS — N529 Male erectile dysfunction, unspecified: Secondary | ICD-10-CM | POA: Diagnosis not present

## 2019-10-05 DIAGNOSIS — E782 Mixed hyperlipidemia: Secondary | ICD-10-CM | POA: Diagnosis not present

## 2019-10-05 DIAGNOSIS — I10 Essential (primary) hypertension: Secondary | ICD-10-CM | POA: Diagnosis not present

## 2019-10-23 DIAGNOSIS — J069 Acute upper respiratory infection, unspecified: Secondary | ICD-10-CM | POA: Diagnosis not present

## 2019-10-23 DIAGNOSIS — R69 Illness, unspecified: Secondary | ICD-10-CM | POA: Diagnosis not present

## 2019-10-23 DIAGNOSIS — Z20822 Contact with and (suspected) exposure to covid-19: Secondary | ICD-10-CM | POA: Diagnosis not present

## 2019-11-15 DIAGNOSIS — R69 Illness, unspecified: Secondary | ICD-10-CM | POA: Diagnosis not present

## 2019-11-24 DIAGNOSIS — E1165 Type 2 diabetes mellitus with hyperglycemia: Secondary | ICD-10-CM | POA: Diagnosis not present

## 2019-11-24 DIAGNOSIS — I10 Essential (primary) hypertension: Secondary | ICD-10-CM | POA: Diagnosis not present

## 2019-11-24 DIAGNOSIS — E782 Mixed hyperlipidemia: Secondary | ICD-10-CM | POA: Diagnosis not present

## 2019-11-24 DIAGNOSIS — G4700 Insomnia, unspecified: Secondary | ICD-10-CM | POA: Diagnosis not present

## 2019-11-24 DIAGNOSIS — Z6841 Body Mass Index (BMI) 40.0 and over, adult: Secondary | ICD-10-CM | POA: Diagnosis not present

## 2019-11-24 DIAGNOSIS — N529 Male erectile dysfunction, unspecified: Secondary | ICD-10-CM | POA: Diagnosis not present

## 2020-01-05 DIAGNOSIS — G4700 Insomnia, unspecified: Secondary | ICD-10-CM | POA: Diagnosis not present

## 2020-01-05 DIAGNOSIS — N529 Male erectile dysfunction, unspecified: Secondary | ICD-10-CM | POA: Diagnosis not present

## 2020-01-05 DIAGNOSIS — Z6841 Body Mass Index (BMI) 40.0 and over, adult: Secondary | ICD-10-CM | POA: Diagnosis not present

## 2020-01-05 DIAGNOSIS — I10 Essential (primary) hypertension: Secondary | ICD-10-CM | POA: Diagnosis not present

## 2020-01-05 DIAGNOSIS — E782 Mixed hyperlipidemia: Secondary | ICD-10-CM | POA: Diagnosis not present

## 2020-01-05 DIAGNOSIS — E1165 Type 2 diabetes mellitus with hyperglycemia: Secondary | ICD-10-CM | POA: Diagnosis not present

## 2020-01-12 DIAGNOSIS — N529 Male erectile dysfunction, unspecified: Secondary | ICD-10-CM | POA: Diagnosis not present

## 2020-01-12 DIAGNOSIS — Z0001 Encounter for general adult medical examination with abnormal findings: Secondary | ICD-10-CM | POA: Diagnosis not present

## 2020-01-12 DIAGNOSIS — J019 Acute sinusitis, unspecified: Secondary | ICD-10-CM | POA: Diagnosis not present

## 2020-01-12 DIAGNOSIS — G4700 Insomnia, unspecified: Secondary | ICD-10-CM | POA: Diagnosis not present

## 2020-01-12 DIAGNOSIS — E1122 Type 2 diabetes mellitus with diabetic chronic kidney disease: Secondary | ICD-10-CM | POA: Diagnosis not present

## 2020-01-12 DIAGNOSIS — E782 Mixed hyperlipidemia: Secondary | ICD-10-CM | POA: Diagnosis not present

## 2020-01-12 DIAGNOSIS — E1165 Type 2 diabetes mellitus with hyperglycemia: Secondary | ICD-10-CM | POA: Diagnosis not present

## 2020-01-12 DIAGNOSIS — N182 Chronic kidney disease, stage 2 (mild): Secondary | ICD-10-CM | POA: Diagnosis not present

## 2020-01-12 DIAGNOSIS — Z125 Encounter for screening for malignant neoplasm of prostate: Secondary | ICD-10-CM | POA: Diagnosis not present

## 2020-01-12 DIAGNOSIS — I10 Essential (primary) hypertension: Secondary | ICD-10-CM | POA: Diagnosis not present

## 2020-01-12 DIAGNOSIS — R809 Proteinuria, unspecified: Secondary | ICD-10-CM | POA: Diagnosis not present

## 2020-01-18 DIAGNOSIS — J029 Acute pharyngitis, unspecified: Secondary | ICD-10-CM | POA: Diagnosis not present

## 2020-01-18 DIAGNOSIS — Z20822 Contact with and (suspected) exposure to covid-19: Secondary | ICD-10-CM | POA: Diagnosis not present

## 2020-01-18 DIAGNOSIS — R0981 Nasal congestion: Secondary | ICD-10-CM | POA: Diagnosis not present

## 2020-02-02 DIAGNOSIS — E1165 Type 2 diabetes mellitus with hyperglycemia: Secondary | ICD-10-CM | POA: Diagnosis not present

## 2020-02-02 DIAGNOSIS — R800 Isolated proteinuria: Secondary | ICD-10-CM | POA: Diagnosis not present

## 2020-02-02 DIAGNOSIS — I10 Essential (primary) hypertension: Secondary | ICD-10-CM | POA: Diagnosis not present

## 2020-02-02 DIAGNOSIS — G4700 Insomnia, unspecified: Secondary | ICD-10-CM | POA: Diagnosis not present

## 2020-02-02 DIAGNOSIS — Z6841 Body Mass Index (BMI) 40.0 and over, adult: Secondary | ICD-10-CM | POA: Diagnosis not present

## 2020-02-02 DIAGNOSIS — E782 Mixed hyperlipidemia: Secondary | ICD-10-CM | POA: Diagnosis not present

## 2020-02-02 DIAGNOSIS — N529 Male erectile dysfunction, unspecified: Secondary | ICD-10-CM | POA: Diagnosis not present

## 2020-03-01 DIAGNOSIS — N529 Male erectile dysfunction, unspecified: Secondary | ICD-10-CM | POA: Diagnosis not present

## 2020-03-01 DIAGNOSIS — J019 Acute sinusitis, unspecified: Secondary | ICD-10-CM | POA: Diagnosis not present

## 2020-03-01 DIAGNOSIS — R800 Isolated proteinuria: Secondary | ICD-10-CM | POA: Diagnosis not present

## 2020-03-01 DIAGNOSIS — E782 Mixed hyperlipidemia: Secondary | ICD-10-CM | POA: Diagnosis not present

## 2020-03-01 DIAGNOSIS — N182 Chronic kidney disease, stage 2 (mild): Secondary | ICD-10-CM | POA: Diagnosis not present

## 2020-03-01 DIAGNOSIS — I10 Essential (primary) hypertension: Secondary | ICD-10-CM | POA: Diagnosis not present

## 2020-03-01 DIAGNOSIS — R809 Proteinuria, unspecified: Secondary | ICD-10-CM | POA: Diagnosis not present

## 2020-03-01 DIAGNOSIS — E1165 Type 2 diabetes mellitus with hyperglycemia: Secondary | ICD-10-CM | POA: Diagnosis not present

## 2020-03-01 DIAGNOSIS — Z0001 Encounter for general adult medical examination with abnormal findings: Secondary | ICD-10-CM | POA: Diagnosis not present

## 2020-03-01 DIAGNOSIS — E1122 Type 2 diabetes mellitus with diabetic chronic kidney disease: Secondary | ICD-10-CM | POA: Diagnosis not present

## 2020-04-03 DIAGNOSIS — Z6841 Body Mass Index (BMI) 40.0 and over, adult: Secondary | ICD-10-CM | POA: Diagnosis not present

## 2020-04-03 DIAGNOSIS — I10 Essential (primary) hypertension: Secondary | ICD-10-CM | POA: Diagnosis not present

## 2020-04-03 DIAGNOSIS — E782 Mixed hyperlipidemia: Secondary | ICD-10-CM | POA: Diagnosis not present

## 2020-04-03 DIAGNOSIS — G4700 Insomnia, unspecified: Secondary | ICD-10-CM | POA: Diagnosis not present

## 2020-04-03 DIAGNOSIS — N529 Male erectile dysfunction, unspecified: Secondary | ICD-10-CM | POA: Diagnosis not present

## 2020-04-03 DIAGNOSIS — E1165 Type 2 diabetes mellitus with hyperglycemia: Secondary | ICD-10-CM | POA: Diagnosis not present

## 2020-04-26 DIAGNOSIS — I10 Essential (primary) hypertension: Secondary | ICD-10-CM | POA: Diagnosis not present

## 2020-04-26 DIAGNOSIS — E782 Mixed hyperlipidemia: Secondary | ICD-10-CM | POA: Diagnosis not present

## 2020-04-26 DIAGNOSIS — E1122 Type 2 diabetes mellitus with diabetic chronic kidney disease: Secondary | ICD-10-CM | POA: Diagnosis not present

## 2020-04-26 DIAGNOSIS — N182 Chronic kidney disease, stage 2 (mild): Secondary | ICD-10-CM | POA: Diagnosis not present

## 2020-04-26 DIAGNOSIS — E1165 Type 2 diabetes mellitus with hyperglycemia: Secondary | ICD-10-CM | POA: Diagnosis not present

## 2020-05-03 DIAGNOSIS — E1122 Type 2 diabetes mellitus with diabetic chronic kidney disease: Secondary | ICD-10-CM | POA: Diagnosis not present

## 2020-05-03 DIAGNOSIS — I10 Essential (primary) hypertension: Secondary | ICD-10-CM | POA: Diagnosis not present

## 2020-05-03 DIAGNOSIS — R800 Isolated proteinuria: Secondary | ICD-10-CM | POA: Diagnosis not present

## 2020-05-03 DIAGNOSIS — J019 Acute sinusitis, unspecified: Secondary | ICD-10-CM | POA: Diagnosis not present

## 2020-05-03 DIAGNOSIS — N529 Male erectile dysfunction, unspecified: Secondary | ICD-10-CM | POA: Diagnosis not present

## 2020-05-03 DIAGNOSIS — Z125 Encounter for screening for malignant neoplasm of prostate: Secondary | ICD-10-CM | POA: Diagnosis not present

## 2020-05-03 DIAGNOSIS — Z9114 Patient's other noncompliance with medication regimen: Secondary | ICD-10-CM | POA: Diagnosis not present

## 2020-05-03 DIAGNOSIS — E1165 Type 2 diabetes mellitus with hyperglycemia: Secondary | ICD-10-CM | POA: Diagnosis not present

## 2020-05-03 DIAGNOSIS — Z6841 Body Mass Index (BMI) 40.0 and over, adult: Secondary | ICD-10-CM | POA: Diagnosis not present

## 2020-05-03 DIAGNOSIS — Z719 Counseling, unspecified: Secondary | ICD-10-CM | POA: Diagnosis not present

## 2020-05-03 DIAGNOSIS — R809 Proteinuria, unspecified: Secondary | ICD-10-CM | POA: Diagnosis not present

## 2020-05-03 DIAGNOSIS — Z0001 Encounter for general adult medical examination with abnormal findings: Secondary | ICD-10-CM | POA: Diagnosis not present

## 2020-05-03 DIAGNOSIS — E782 Mixed hyperlipidemia: Secondary | ICD-10-CM | POA: Diagnosis not present

## 2020-05-03 DIAGNOSIS — G4700 Insomnia, unspecified: Secondary | ICD-10-CM | POA: Diagnosis not present

## 2020-05-03 DIAGNOSIS — N182 Chronic kidney disease, stage 2 (mild): Secondary | ICD-10-CM | POA: Diagnosis not present

## 2020-05-05 DIAGNOSIS — E1165 Type 2 diabetes mellitus with hyperglycemia: Secondary | ICD-10-CM | POA: Diagnosis not present

## 2020-05-05 DIAGNOSIS — I10 Essential (primary) hypertension: Secondary | ICD-10-CM | POA: Diagnosis not present

## 2020-05-05 DIAGNOSIS — R800 Isolated proteinuria: Secondary | ICD-10-CM | POA: Diagnosis not present

## 2020-05-05 DIAGNOSIS — N529 Male erectile dysfunction, unspecified: Secondary | ICD-10-CM | POA: Diagnosis not present

## 2020-05-05 DIAGNOSIS — E291 Testicular hypofunction: Secondary | ICD-10-CM | POA: Diagnosis not present

## 2020-05-31 DIAGNOSIS — N529 Male erectile dysfunction, unspecified: Secondary | ICD-10-CM | POA: Diagnosis not present

## 2020-05-31 DIAGNOSIS — E291 Testicular hypofunction: Secondary | ICD-10-CM | POA: Diagnosis not present

## 2020-05-31 DIAGNOSIS — R800 Isolated proteinuria: Secondary | ICD-10-CM | POA: Diagnosis not present

## 2020-05-31 DIAGNOSIS — Z79899 Other long term (current) drug therapy: Secondary | ICD-10-CM | POA: Diagnosis not present

## 2020-06-02 ENCOUNTER — Encounter (INDEPENDENT_AMBULATORY_CARE_PROVIDER_SITE_OTHER): Payer: Self-pay

## 2020-08-02 DIAGNOSIS — R972 Elevated prostate specific antigen [PSA]: Secondary | ICD-10-CM | POA: Diagnosis not present

## 2020-08-02 DIAGNOSIS — E1122 Type 2 diabetes mellitus with diabetic chronic kidney disease: Secondary | ICD-10-CM | POA: Diagnosis not present

## 2020-08-02 DIAGNOSIS — I10 Essential (primary) hypertension: Secondary | ICD-10-CM | POA: Diagnosis not present

## 2020-08-08 DIAGNOSIS — R944 Abnormal results of kidney function studies: Secondary | ICD-10-CM | POA: Diagnosis not present

## 2020-08-08 DIAGNOSIS — G4700 Insomnia, unspecified: Secondary | ICD-10-CM | POA: Diagnosis not present

## 2020-08-08 DIAGNOSIS — E782 Mixed hyperlipidemia: Secondary | ICD-10-CM | POA: Diagnosis not present

## 2020-08-08 DIAGNOSIS — E291 Testicular hypofunction: Secondary | ICD-10-CM | POA: Diagnosis not present

## 2020-08-08 DIAGNOSIS — N529 Male erectile dysfunction, unspecified: Secondary | ICD-10-CM | POA: Diagnosis not present

## 2020-08-08 DIAGNOSIS — N401 Enlarged prostate with lower urinary tract symptoms: Secondary | ICD-10-CM | POA: Diagnosis not present

## 2020-08-08 DIAGNOSIS — E1165 Type 2 diabetes mellitus with hyperglycemia: Secondary | ICD-10-CM | POA: Diagnosis not present

## 2020-08-08 DIAGNOSIS — R800 Isolated proteinuria: Secondary | ICD-10-CM | POA: Diagnosis not present

## 2020-08-21 DIAGNOSIS — M7542 Impingement syndrome of left shoulder: Secondary | ICD-10-CM | POA: Diagnosis not present

## 2020-08-21 DIAGNOSIS — M7541 Impingement syndrome of right shoulder: Secondary | ICD-10-CM | POA: Diagnosis not present

## 2020-09-04 DIAGNOSIS — E1165 Type 2 diabetes mellitus with hyperglycemia: Secondary | ICD-10-CM | POA: Diagnosis not present

## 2020-09-04 DIAGNOSIS — I10 Essential (primary) hypertension: Secondary | ICD-10-CM | POA: Diagnosis not present

## 2020-09-18 DIAGNOSIS — R800 Isolated proteinuria: Secondary | ICD-10-CM | POA: Diagnosis not present

## 2020-09-18 DIAGNOSIS — Z23 Encounter for immunization: Secondary | ICD-10-CM | POA: Diagnosis not present

## 2020-09-18 DIAGNOSIS — G4700 Insomnia, unspecified: Secondary | ICD-10-CM | POA: Diagnosis not present

## 2020-09-18 DIAGNOSIS — E1165 Type 2 diabetes mellitus with hyperglycemia: Secondary | ICD-10-CM | POA: Diagnosis not present

## 2020-09-18 DIAGNOSIS — R059 Cough, unspecified: Secondary | ICD-10-CM | POA: Diagnosis not present

## 2020-09-18 DIAGNOSIS — R944 Abnormal results of kidney function studies: Secondary | ICD-10-CM | POA: Diagnosis not present

## 2020-09-18 DIAGNOSIS — N401 Enlarged prostate with lower urinary tract symptoms: Secondary | ICD-10-CM | POA: Diagnosis not present

## 2020-09-18 DIAGNOSIS — E291 Testicular hypofunction: Secondary | ICD-10-CM | POA: Diagnosis not present

## 2020-09-18 DIAGNOSIS — E782 Mixed hyperlipidemia: Secondary | ICD-10-CM | POA: Diagnosis not present

## 2020-09-20 DIAGNOSIS — E291 Testicular hypofunction: Secondary | ICD-10-CM | POA: Diagnosis not present

## 2020-09-20 DIAGNOSIS — R7989 Other specified abnormal findings of blood chemistry: Secondary | ICD-10-CM | POA: Diagnosis not present

## 2020-10-02 DIAGNOSIS — J069 Acute upper respiratory infection, unspecified: Secondary | ICD-10-CM | POA: Diagnosis not present

## 2020-10-02 DIAGNOSIS — R059 Cough, unspecified: Secondary | ICD-10-CM | POA: Diagnosis not present

## 2020-10-11 DIAGNOSIS — Z9889 Other specified postprocedural states: Secondary | ICD-10-CM | POA: Diagnosis not present

## 2020-10-11 DIAGNOSIS — E291 Testicular hypofunction: Secondary | ICD-10-CM | POA: Diagnosis not present

## 2020-11-04 DIAGNOSIS — E785 Hyperlipidemia, unspecified: Secondary | ICD-10-CM | POA: Diagnosis not present

## 2020-11-04 DIAGNOSIS — I10 Essential (primary) hypertension: Secondary | ICD-10-CM | POA: Diagnosis not present

## 2020-12-06 DIAGNOSIS — E291 Testicular hypofunction: Secondary | ICD-10-CM | POA: Diagnosis not present

## 2020-12-06 DIAGNOSIS — E782 Mixed hyperlipidemia: Secondary | ICD-10-CM | POA: Diagnosis not present

## 2020-12-06 DIAGNOSIS — E1165 Type 2 diabetes mellitus with hyperglycemia: Secondary | ICD-10-CM | POA: Diagnosis not present

## 2020-12-13 DIAGNOSIS — J310 Chronic rhinitis: Secondary | ICD-10-CM | POA: Diagnosis not present

## 2020-12-13 DIAGNOSIS — J329 Chronic sinusitis, unspecified: Secondary | ICD-10-CM | POA: Diagnosis not present

## 2020-12-13 DIAGNOSIS — G4700 Insomnia, unspecified: Secondary | ICD-10-CM | POA: Diagnosis not present

## 2020-12-13 DIAGNOSIS — N401 Enlarged prostate with lower urinary tract symptoms: Secondary | ICD-10-CM | POA: Diagnosis not present

## 2020-12-13 DIAGNOSIS — E782 Mixed hyperlipidemia: Secondary | ICD-10-CM | POA: Diagnosis not present

## 2020-12-13 DIAGNOSIS — R800 Isolated proteinuria: Secondary | ICD-10-CM | POA: Diagnosis not present

## 2020-12-13 DIAGNOSIS — R944 Abnormal results of kidney function studies: Secondary | ICD-10-CM | POA: Diagnosis not present

## 2020-12-13 DIAGNOSIS — Z0001 Encounter for general adult medical examination with abnormal findings: Secondary | ICD-10-CM | POA: Diagnosis not present

## 2020-12-13 DIAGNOSIS — E1165 Type 2 diabetes mellitus with hyperglycemia: Secondary | ICD-10-CM | POA: Diagnosis not present

## 2020-12-13 DIAGNOSIS — E291 Testicular hypofunction: Secondary | ICD-10-CM | POA: Diagnosis not present

## 2021-01-03 DIAGNOSIS — E782 Mixed hyperlipidemia: Secondary | ICD-10-CM | POA: Diagnosis not present

## 2021-01-03 DIAGNOSIS — I10 Essential (primary) hypertension: Secondary | ICD-10-CM | POA: Diagnosis not present

## 2021-02-03 DIAGNOSIS — I10 Essential (primary) hypertension: Secondary | ICD-10-CM | POA: Diagnosis not present

## 2021-02-03 DIAGNOSIS — Z1211 Encounter for screening for malignant neoplasm of colon: Secondary | ICD-10-CM | POA: Diagnosis not present

## 2021-02-04 DIAGNOSIS — E782 Mixed hyperlipidemia: Secondary | ICD-10-CM | POA: Diagnosis not present

## 2021-02-04 DIAGNOSIS — I10 Essential (primary) hypertension: Secondary | ICD-10-CM | POA: Diagnosis not present

## 2021-02-25 DIAGNOSIS — Z1211 Encounter for screening for malignant neoplasm of colon: Secondary | ICD-10-CM | POA: Diagnosis not present

## 2021-03-14 DIAGNOSIS — E782 Mixed hyperlipidemia: Secondary | ICD-10-CM | POA: Diagnosis not present

## 2021-03-14 DIAGNOSIS — E1165 Type 2 diabetes mellitus with hyperglycemia: Secondary | ICD-10-CM | POA: Diagnosis not present

## 2021-03-21 DIAGNOSIS — E782 Mixed hyperlipidemia: Secondary | ICD-10-CM | POA: Diagnosis not present

## 2021-03-21 DIAGNOSIS — R800 Isolated proteinuria: Secondary | ICD-10-CM | POA: Diagnosis not present

## 2021-03-21 DIAGNOSIS — G4700 Insomnia, unspecified: Secondary | ICD-10-CM | POA: Diagnosis not present

## 2021-03-21 DIAGNOSIS — J329 Chronic sinusitis, unspecified: Secondary | ICD-10-CM | POA: Diagnosis not present

## 2021-03-21 DIAGNOSIS — R944 Abnormal results of kidney function studies: Secondary | ICD-10-CM | POA: Diagnosis not present

## 2021-03-21 DIAGNOSIS — N401 Enlarged prostate with lower urinary tract symptoms: Secondary | ICD-10-CM | POA: Diagnosis not present

## 2021-03-21 DIAGNOSIS — E291 Testicular hypofunction: Secondary | ICD-10-CM | POA: Diagnosis not present

## 2021-03-21 DIAGNOSIS — E1165 Type 2 diabetes mellitus with hyperglycemia: Secondary | ICD-10-CM | POA: Diagnosis not present

## 2021-03-21 DIAGNOSIS — J310 Chronic rhinitis: Secondary | ICD-10-CM | POA: Diagnosis not present

## 2021-04-04 DIAGNOSIS — E1165 Type 2 diabetes mellitus with hyperglycemia: Secondary | ICD-10-CM | POA: Diagnosis not present

## 2021-04-04 DIAGNOSIS — I10 Essential (primary) hypertension: Secondary | ICD-10-CM | POA: Diagnosis not present

## 2021-04-28 ENCOUNTER — Observation Stay (HOSPITAL_COMMUNITY)
Admission: EM | Admit: 2021-04-28 | Discharge: 2021-04-29 | Disposition: A | Discharge status: Home / Self Care | Payer: Medicare HMO | Attending: Internal Medicine | Admitting: Internal Medicine

## 2021-04-28 ENCOUNTER — Emergency Department (HOSPITAL_COMMUNITY): Payer: Medicare HMO

## 2021-04-28 ENCOUNTER — Encounter (HOSPITAL_COMMUNITY): Payer: Self-pay | Admitting: *Deleted

## 2021-04-28 ENCOUNTER — Other Ambulatory Visit: Payer: Self-pay

## 2021-04-28 DIAGNOSIS — E669 Obesity, unspecified: Secondary | ICD-10-CM | POA: Insufficient documentation

## 2021-04-28 DIAGNOSIS — Z7982 Long term (current) use of aspirin: Secondary | ICD-10-CM | POA: Diagnosis not present

## 2021-04-28 DIAGNOSIS — I351 Nonrheumatic aortic (valve) insufficiency: Secondary | ICD-10-CM | POA: Insufficient documentation

## 2021-04-28 DIAGNOSIS — I77810 Thoracic aortic ectasia: Secondary | ICD-10-CM | POA: Diagnosis not present

## 2021-04-28 DIAGNOSIS — Z7984 Long term (current) use of oral hypoglycemic drugs: Secondary | ICD-10-CM | POA: Diagnosis not present

## 2021-04-28 DIAGNOSIS — I358 Other nonrheumatic aortic valve disorders: Secondary | ICD-10-CM | POA: Insufficient documentation

## 2021-04-28 DIAGNOSIS — M6281 Muscle weakness (generalized): Secondary | ICD-10-CM | POA: Insufficient documentation

## 2021-04-28 DIAGNOSIS — Z794 Long term (current) use of insulin: Secondary | ICD-10-CM | POA: Diagnosis not present

## 2021-04-28 DIAGNOSIS — I700 Atherosclerosis of aorta: Secondary | ICD-10-CM | POA: Insufficient documentation

## 2021-04-28 DIAGNOSIS — I6782 Cerebral ischemia: Secondary | ICD-10-CM | POA: Diagnosis not present

## 2021-04-28 DIAGNOSIS — Z7902 Long term (current) use of antithrombotics/antiplatelets: Secondary | ICD-10-CM | POA: Diagnosis not present

## 2021-04-28 DIAGNOSIS — M1990 Unspecified osteoarthritis, unspecified site: Secondary | ICD-10-CM | POA: Diagnosis not present

## 2021-04-28 DIAGNOSIS — R9431 Abnormal electrocardiogram [ECG] [EKG]: Secondary | ICD-10-CM | POA: Insufficient documentation

## 2021-04-28 DIAGNOSIS — Z79899 Other long term (current) drug therapy: Secondary | ICD-10-CM | POA: Diagnosis not present

## 2021-04-28 DIAGNOSIS — I119 Hypertensive heart disease without heart failure: Secondary | ICD-10-CM | POA: Diagnosis not present

## 2021-04-28 DIAGNOSIS — R2689 Other abnormalities of gait and mobility: Secondary | ICD-10-CM | POA: Insufficient documentation

## 2021-04-28 DIAGNOSIS — Z833 Family history of diabetes mellitus: Secondary | ICD-10-CM | POA: Diagnosis not present

## 2021-04-28 DIAGNOSIS — I6523 Occlusion and stenosis of bilateral carotid arteries: Secondary | ICD-10-CM | POA: Diagnosis not present

## 2021-04-28 DIAGNOSIS — Z6841 Body Mass Index (BMI) 40.0 and over, adult: Secondary | ICD-10-CM | POA: Insufficient documentation

## 2021-04-28 DIAGNOSIS — E785 Hyperlipidemia, unspecified: Secondary | ICD-10-CM | POA: Diagnosis present

## 2021-04-28 DIAGNOSIS — Z7985 Long-term (current) use of injectable non-insulin antidiabetic drugs: Secondary | ICD-10-CM | POA: Insufficient documentation

## 2021-04-28 DIAGNOSIS — R2681 Unsteadiness on feet: Secondary | ICD-10-CM | POA: Insufficient documentation

## 2021-04-28 DIAGNOSIS — R4701 Aphasia: Secondary | ICD-10-CM | POA: Insufficient documentation

## 2021-04-28 DIAGNOSIS — R29898 Other symptoms and signs involving the musculoskeletal system: Secondary | ICD-10-CM | POA: Insufficient documentation

## 2021-04-28 DIAGNOSIS — I10 Essential (primary) hypertension: Secondary | ICD-10-CM | POA: Diagnosis not present

## 2021-04-28 DIAGNOSIS — Z8249 Family history of ischemic heart disease and other diseases of the circulatory system: Secondary | ICD-10-CM | POA: Insufficient documentation

## 2021-04-28 DIAGNOSIS — E1165 Type 2 diabetes mellitus with hyperglycemia: Secondary | ICD-10-CM

## 2021-04-28 DIAGNOSIS — I639 Cerebral infarction, unspecified: Secondary | ICD-10-CM | POA: Diagnosis not present

## 2021-04-28 DIAGNOSIS — R471 Dysarthria and anarthria: Secondary | ICD-10-CM | POA: Insufficient documentation

## 2021-04-28 DIAGNOSIS — E119 Type 2 diabetes mellitus without complications: Secondary | ICD-10-CM | POA: Diagnosis present

## 2021-04-28 DIAGNOSIS — D649 Anemia, unspecified: Secondary | ICD-10-CM | POA: Insufficient documentation

## 2021-04-28 DIAGNOSIS — R29818 Other symptoms and signs involving the nervous system: Secondary | ICD-10-CM | POA: Insufficient documentation

## 2021-04-28 DIAGNOSIS — F5101 Primary insomnia: Secondary | ICD-10-CM

## 2021-04-28 LAB — CBC
HCT: 41.9 % (ref 39.0–52.0)
Hemoglobin: 14 g/dL (ref 13.0–17.0)
MCH: 29 pg (ref 26.0–34.0)
MCHC: 33.4 g/dL (ref 30.0–36.0)
MCV: 86.9 fL (ref 80.0–100.0)
Platelets: 199 10*3/uL (ref 150–400)
RBC: 4.82 MIL/uL (ref 4.22–5.81)
RDW: 12.6 % (ref 11.5–15.5)
WBC: 7 10*3/uL (ref 4.0–10.5)
nRBC: 0 % (ref 0.0–0.2)

## 2021-04-28 LAB — URINALYSIS, ROUTINE W REFLEX MICROSCOPIC
Bacteria, UA: NONE SEEN
Bilirubin Urine: NEGATIVE
Glucose, UA: 50 mg/dL — AB
Hgb urine dipstick: NEGATIVE
Ketones, ur: NEGATIVE mg/dL
Leukocytes,Ua: NEGATIVE
Nitrite: NEGATIVE
Protein, ur: 30 mg/dL — AB
Specific Gravity, Urine: 1.016 (ref 1.005–1.030)
pH: 5 (ref 5.0–8.0)

## 2021-04-28 LAB — COMPREHENSIVE METABOLIC PANEL
ALT: 28 U/L (ref 0–44)
AST: 25 U/L (ref 15–41)
Albumin: 4.2 g/dL (ref 3.5–5.0)
Alkaline Phosphatase: 38 U/L (ref 38–126)
Anion gap: 7 (ref 5–15)
BUN: 29 mg/dL — ABNORMAL HIGH (ref 6–20)
CO2: 24 mmol/L (ref 22–32)
Calcium: 9.2 mg/dL (ref 8.9–10.3)
Chloride: 109 mmol/L (ref 98–111)
Creatinine, Ser: 1.07 mg/dL (ref 0.61–1.24)
GFR, Estimated: >60 mL/min (ref >60)
Glucose, Bld: 178 mg/dL — ABNORMAL HIGH (ref 70–99)
Potassium: 3.7 mmol/L (ref 3.5–5.1)
Sodium: 140 mmol/L (ref 135–145)
Total Bilirubin: 0.5 mg/dL (ref 0.3–1.2)
Total Protein: 7.5 g/dL (ref 6.5–8.1)

## 2021-04-28 LAB — DIFFERENTIAL
Abs Immature Granulocytes: 0.01 10*3/uL (ref 0.00–0.07)
Basophils Absolute: 0.1 10*3/uL (ref 0.0–0.1)
Basophils Relative: 1 %
Eosinophils Absolute: 0.2 10*3/uL (ref 0.0–0.5)
Eosinophils Relative: 3 %
Immature Granulocytes: 0 %
Lymphocytes Relative: 15 %
Lymphs Abs: 1.1 10*3/uL (ref 0.7–4.0)
Monocytes Absolute: 0.4 10*3/uL (ref 0.1–1.0)
Monocytes Relative: 6 %
Neutro Abs: 5.2 10*3/uL (ref 1.7–7.7)
Neutrophils Relative %: 75 %

## 2021-04-28 LAB — RAPID URINE DRUG SCREEN, HOSP PERFORMED
Amphetamines: NOT DETECTED
Barbiturates: NOT DETECTED
Benzodiazepines: NOT DETECTED
Cocaine: NOT DETECTED
Opiates: NOT DETECTED
Tetrahydrocannabinol: NOT DETECTED

## 2021-04-28 LAB — ETHANOL: Alcohol, Ethyl (B): <10 mg/dL (ref <10)

## 2021-04-28 LAB — APTT: aPTT: 26 seconds (ref 24–36)

## 2021-04-28 LAB — PROTIME-INR
INR: 1 (ref 0.8–1.2)
Prothrombin Time: 13.1 seconds (ref 11.4–15.2)

## 2021-04-28 MED ORDER — LORAZEPAM 2 MG/ML IJ SOLN
1.0000 mg | Freq: Once | INTRAMUSCULAR | Status: AC
  Administered 2021-04-28: 1 mg via INTRAVENOUS
  Filled 2021-04-28: qty 1

## 2021-04-28 MED ORDER — SODIUM CHLORIDE 0.9 % IV SOLN
100.0000 mL/h | INTRAVENOUS | Status: DC
  Administered 2021-04-28 – 2021-04-29 (×2): 100 mL/h via INTRAVENOUS

## 2021-04-28 MED ORDER — CLOPIDOGREL BISULFATE 75 MG PO TABS
75.0000 mg | ORAL_TABLET | Freq: Every day | ORAL | Status: DC
  Administered 2021-04-28 – 2021-04-29 (×2): 75 mg via ORAL
  Filled 2021-04-28 (×2): qty 1

## 2021-04-28 MED ORDER — HYDRALAZINE HCL 20 MG/ML IJ SOLN
10.0000 mg | Freq: Four times a day (QID) | INTRAMUSCULAR | Status: DC | PRN
Start: 2021-04-28 — End: 2021-04-29

## 2021-04-28 MED ORDER — SODIUM CHLORIDE 0.9 % IV BOLUS
500.0000 mL | Freq: Once | INTRAVENOUS | Status: AC
  Administered 2021-04-28: 500 mL via INTRAVENOUS

## 2021-04-28 MED ORDER — ASPIRIN 81 MG PO CHEW
81.0000 mg | CHEWABLE_TABLET | Freq: Every day | ORAL | Status: DC
  Administered 2021-04-29: 81 mg via ORAL
  Filled 2021-04-28 (×2): qty 1

## 2021-04-28 NOTE — ED Notes (Signed)
Report called to [place], RN [phone] ?

## 2021-04-28 NOTE — Assessment & Plan Note (Signed)
-   Holding amlodipine and lisinopril for permissive hypertension ?-Hydralazine 10 mg IV for blood pressure greater than 220 ? ?

## 2021-04-28 NOTE — ED Provider Notes (Signed)
?[place] EMERGENCY DEPARTMENT ?Provider Note ? ? ?CSN: [id number] ?Arrival date & time: 04/28/21  1432 ? ?  ? ?History ? ?Chief Complaint  ?Patient presents with  ? Aphasia  ? ? ?Troy Randall is a 61 y.o. male. ? ?HPI ?Patient presents for difficulty with speech.  His medical history includes anemia, arthritis, diabetes, HLD, HTN.  He first noticed the symptoms at 10 AM this morning.  At the time, he was out of town.  He noticed that his speech seemed slurred when verbalizing certain words.  Words that he noticed it occurring include "metformin" and "dentist".  He did not experience any other symptoms.  He continues to feel that his speech is not quite back to baseline.  He denies any areas of numbness or weakness.  He has not had any dizziness or headaches.  Patient does take a 81 mg aspirin daily.  His other medications include blood pressure medicines and diabetes medications. ?  ? ?Home Medications ?Prior to Admission medications   ?Medication Sig Start Date End Date Taking? Authorizing Provider  ?amLODipine (NORVASC) 10 MG tablet Take 1 tablet (10 mg total) by mouth daily. 06/15/17  Yes Aliene Beams, MD  ?aspirin EC 81 MG tablet Take 81 mg by mouth daily. Swallow whole.   Yes [provider]  ?atorvastatin (LIPITOR) 40 MG tablet Take 1 tablet (40 mg total) by mouth daily. 03/31/17  Yes Hagler, Fleet Contras, MD  ?levocetirizine (XYZAL) 5 MG tablet Take 5 mg by mouth daily. 01/13/21  Yes [provider]  ?lisinopril (PRINIVIL,ZESTRIL) 40 MG tablet Take 1 tablet (40 mg total) by mouth daily. 05/12/17  Yes Aliene Beams, MD  ?metFORMIN (GLUCOPHAGE-XR) 500 MG 24 hr tablet Take 500-1,000 mg by mouth in the morning and at bedtime. 02/09/21  Yes [provider]  ?naproxen sodium (ALEVE) 220 MG tablet Take 220 mg by mouth 2 (two) times daily as needed (pain).   Yes [provider]  ?tamsulosin (FLOMAX) 0.4 MG CAPS capsule Take 0.4 mg by mouth daily. 02/12/21  Yes [provider]   ?traZODone (DESYREL) 150 MG tablet Take 150 mg by mouth at bedtime. 02/04/21  Yes [provider]  ?TRESIBA FLEXTOUCH 200 UNIT/ML FlexTouch Pen Inject 20 Units into the skin in the morning and at bedtime. 03/14/21  Yes [provider]  ?BD VEO INSULIN SYRINGE U/F 31G X 15/64" 1 ML MISC  USE AS DIRECTED 06/08/17   Aliene Beams, MD  ?fenofibrate (TRICOR) 145 MG tablet Take 1 tablet (145 mg total) by mouth daily. ?Patient not taking: Reported on 04/29/2021 06/30/17   Aliene Beams, MD  ?glipiZIDE (GLUCOTROL XL) 10 MG 24 hr tablet Take 1 tablet (10 mg total) by mouth daily with breakfast. ?Patient not taking: Reported on 04/29/2021 04/16/17   Aliene Beams, MD  ?glucose blood [facility] VERIO) test strip TEST twice a day 05/04/17   Aliene Beams, MD  ?hydrochlorothiazide (HYDRODIURIL) 25 MG tablet Take 1 tablet (25 mg total) by mouth daily. ?Patient not taking: Reported on 04/29/2021 07/16/17   Aliene Beams, MD  ?Insulin Pen Needle (NOVOTWIST) 32G X 5 MM MISC Use two daily to inject Victoza and Toujeo. 04/25/15   Reather Littler, MD  ?LEVEMIR FLEXTOUCH 100 UNIT/ML Pen INJECT 40 UNITS SUBCUTANEOUSLY TWICE DAILY ?Patient not taking: Reported on 04/29/2021 09/20/17   Aliene Beams, MD  ?Dola Argyle LANCETS 33G MISC Use to check blood sugar once a day dx code E11.65 11/21/14   Reather Littler, MD  ?sildenafil (VIAGRA) 100 MG  tablet Take 1 tablet (100 mg total) by mouth daily as needed for erectile dysfunction. ?Patient not taking: Reported on 04/29/2021 03/31/17   Aliene Beams, MD  ?traZODone (DESYREL) 50 MG tablet Take 0.5-1 tablets (25-50 mg total) by mouth at bedtime as needed for sleep. ?Patient not taking: Reported on 04/29/2021 07/16/17   Aliene Beams, MD  ?   ? ?Allergies    ?Metformin and related and Penicillins   ? ?Review of Systems   ?Review of Systems  ?Neurological:  Positive for speech difficulty.  ?All other systems reviewed and are negative. ? ?Physical Exam ?Updated Vital Signs ?BP (!) 155/82 (BP  Location: Left Arm)   Pulse 71   Temp 97.6 ?F (36.4 ?C) (Oral)   Resp 20   Ht 6' (1.829 m)   Wt (!) 143.4 kg   SpO2 95%   BMI 42.88 kg/m?  ?Physical Exam ?Vitals and nursing note reviewed.  ?Constitutional:   ?   General: He is not in acute distress. ?   Appearance: Normal appearance. He is well-developed. He is not ill-appearing, toxic-appearing or diaphoretic.  ?HENT:  ?   Head: Normocephalic and atraumatic.  ?   Right Ear: External ear normal.  ?   Left Ear: External ear normal.  ?   Nose: Nose normal.  ?   Mouth/Throat:  ?   Mouth: Mucous membranes are moist.  ?   Pharynx: Oropharynx is clear.  ?Eyes:  ?   General: No visual field deficit. ?   Extraocular Movements: Extraocular movements intact.  ?   Conjunctiva/sclera: Conjunctivae normal.  ?Cardiovascular:  ?   Rate and Rhythm: Normal rate and regular rhythm.  ?   Heart sounds: No murmur heard. ?Pulmonary:  ?   Effort: Pulmonary effort is normal. No respiratory distress.  ?Abdominal:  ?   General: There is no distension.  ?   Palpations: Abdomen is soft.  ?Musculoskeletal:     ?   General: No swelling. Normal range of motion.  ?   Cervical back: Normal range of motion and neck supple. No rigidity or tenderness.  ?   Right lower leg: No edema.  ?   Left lower leg: No edema.  ?Skin: ?   General: Skin is warm and dry.  ?   Capillary Refill: Capillary refill takes less than 2 seconds.  ?   Coloration: Skin is not jaundiced or pale.  ?Neurological:  ?   Mental Status: He is alert and oriented to person, place, and time.  ?   Cranial Nerves: Cranial nerves 2-12 are intact. No cranial nerve deficit, dysarthria or facial asymmetry.  ?   Sensory: Sensation is intact. No sensory deficit.  ?   Motor: Motor function is intact. No weakness, abnormal muscle tone or pronator drift.  ?   Coordination: Coordination is intact. Finger-Nose-Finger Test normal.  ?Psychiatric:     ?   Mood and Affect: Mood normal.  ? ? ?ED Results / Procedures / Treatments   ?Labs ?(all labs  ordered are listed, but only abnormal results are displayed) ?Labs Reviewed  ?COMPREHENSIVE METABOLIC PANEL - Abnormal; Notable for the following components:  ?    Result Value  ? Glucose, Bld 178 (*)   ? BUN 29 (*)   ? All other components within normal limits  ?URINALYSIS, ROUTINE W REFLEX MICROSCOPIC - Abnormal; Notable for the following components:  ? Color, Urine STRAW (*)   ? Glucose, UA 50 (*)   ? Protein, ur 30 (*)   ?  All other components within normal limits  ?LIPID PANEL - Abnormal; Notable for the following components:  ? HDL 32 (*)   ? All other components within normal limits  ?HEMOGLOBIN A1C - Abnormal; Notable for the following components:  ? Hgb A1c MFr Bld 6.2 (*)   ? All other components within normal limits  ?COMPREHENSIVE METABOLIC PANEL - Abnormal; Notable for the following components:  ? Chloride 112 (*)   ? Calcium 8.7 (*)   ? Alkaline Phosphatase 34 (*)   ? Anion gap 4 (*)   ? All other components within normal limits  ?GLUCOSE, CAPILLARY - Abnormal; Notable for the following components:  ? Glucose-Capillary 269 (*)   ? All other components within normal limits  ?GLUCOSE, CAPILLARY - Abnormal; Notable for the following components:  ? Glucose-Capillary 172 (*)   ? All other components within normal limits  ?ETHANOL  ?PROTIME-INR  ?APTT  ?CBC  ?DIFFERENTIAL  ?RAPID URINE DRUG SCREEN, HOSP PERFORMED  ?HIV ANTIBODY (ROUTINE TESTING W REFLEX)  ?CBC  ?GLUCOSE, CAPILLARY  ?I-STAT CHEM 8, ED  ? ? ?EKG ?EKG Interpretation ? ?Date/Time:  Monday April 28 2021 14:42:32 EDT ?Ventricular Rate:  77 ?PR Interval:  199 ?QRS Duration: 93 ?QT Interval:  395 ?QTC Calculation: 447 ?R Axis:   3 ?Text Interpretation: Sinus rhythm Abnormal R-wave progression, early transition Confirmed by Gloris Manchester [phone]) on 04/28/2021 3:04:01 PM ? ?Radiology ?CT HEAD WO CONTRAST ? ?Result Date: 04/28/2021 ?CLINICAL DATA:  Neuro deficit, acute, stroke suspected EXAM: CT HEAD WITHOUT CONTRAST TECHNIQUE: Contiguous axial images were  obtained from the base of the skull through the vertex without intravenous contrast. RADIATION DOSE REDUCTION: This exam was performed according to the departmental dose-optimization program which includes automat

## 2021-04-28 NOTE — Progress Notes (Signed)
Neurology telephone note ? ?Troy Randall is a 61 y.o. male with medical history significant of with history of hypertension, hyperlipidemia, diabetes mellitus type 2, and more presents to ED with a chief complaint of slurred speech. I was contacted by Dr. Durwin Nora after his MRI brain showed small L frontal subcortical infarct. ? ?Recommendations: ?- Admit to hospitalist service for stroke w/u ?- Permissive HTN x48 hrs from sx onset goal BP <220/110. PRN labetalol or hydralazine if BP above these parameters. Avoid oral antihypertensives. ?- MRA head ?- Carotid dopplers ?- TTE w/ bubble ?- Check A1c and LDL + add statin per guidelines ?- ASA 81mg  daily + plavix 75mg  daily x21 days f/b ASA 81mg  daily monotherapy after that ?- q4 hr neuro checks ?- STAT head CT for any change in neuro exam ?- Tele ?- PT/OT/SLP ?- Stroke education ?- Amb referral to neurology upon discharge  ?- If further neurology guidance is needed during this admission please place routine consult to teleneurologist Dr. . ? ? , MD ?[facility] ?[phone] ? ?If 7pm- 7am, please page neurology on call as listed in AMION. ? ?

## 2021-04-28 NOTE — Assessment & Plan Note (Signed)
-   Last hemoglobin A1c 9.5 ?-Takes 44 units of Tresiba ?-Holding metformin and glipizide ?-Reduced insulin while in hospital- 25 units with sliding scale coverage ?-Carb modified diet ?-Monitor CBGs ?

## 2021-04-28 NOTE — ED Triage Notes (Signed)
Pt states he noticed this am he had some slurred speech and unable to get out certain words; pt states he still feels that way ?

## 2021-04-28 NOTE — ED Notes (Signed)
Called floor 3A to acquire RN taking pt. Currently change of shift and nurse to call back when assigned ?

## 2021-04-28 NOTE — Assessment & Plan Note (Signed)
-   MR angio shows acute small left frontal subcortical infarct.  Additional small chronic infarcts and chronic microvascular ischemic changes ?-Neurology consulted and recommends daily aspirin and Plavix ?-Echo in the a.m. ?-Monitor on telemetry ?-PT/OT/ST eval and treat ?-Continue to monitor ?

## 2021-04-28 NOTE — Assessment & Plan Note (Signed)
-   Fenofibrate and atorvastatin ?-Lipid panel in the a.m. ?

## 2021-04-28 NOTE — H&P (Signed)
?History and Physical  ? ? ?Patient: Troy Randall YPP:[id number] DOB: [birth year] ?DOA: 04/28/2021 ?DOS: the patient was seen and examined on 04/28/2021 ?PCP: Benita Stabile, MD  ?Patient coming from: Home ? ?Chief Complaint:  ?Chief Complaint  ?Patient presents with  ? Aphasia  ? ?HPI: Troy Randall is a 61 y.o. male with medical history significant of with history of hypertension, hyperlipidemia, diabetes mellitus type 2, and more presents to ED with a chief complaint of slurred speech.  Patient reports that he woke up normal this morning, and then at 10 AM he began to have slurred speech.  Patient denies any headache, change in vision, change in hearing, weakness, dysphagia, numbness/tingling.  He reports no trouble with word finding, just with slurred speech.  Patient reports he has had this happen to him before so he did not think much of it.  His family notices slurred speech and advised him to come to the ER.  3 hours after they advised him to he decided to come to the ER.  He continues to have slurred speech at admission.  He still has no associated complaints.  Patient has no other complaints at this time. ? ?The only other thing to note is he was recently been on a long car ride this weekend.  He went to and from [place] 6 hours each way. ? ?Patient does not smoke, does not drink alcohol, does not use illicit drugs.  He has been vaccinated for COVID.  Patient is full code. ?Review of Systems: As mentioned in the history of present illness. All other systems reviewed and are negative. ?Past Medical History:  ?Diagnosis Date  ? Anemia   ? Arthritis   ? Diabetes mellitus without complication (HCC)   ? Hyperlipidemia   ? Hypertension   ? Kidney stones   ? ?Past Surgical History:  ?Procedure Laterality Date  ? ANKLE SURGERY    ? HERNIA REPAIR    ? umbilical x1 Incisional x1  ? INCISIONAL HERNIA REPAIR  04/13/2011  ? Procedure: LAPAROSCOPIC INCISIONAL HERNIA;  Surgeon: Dalia Heading, MD;  Location: [facility];   Service: General;  Laterality: N/A;  Recurrent Laparoscopic Incisional Herniorraphy with Mesh  ? KIDNEY STONE SURGERY    ? ?Social History:  reports that he has never smoked. His smokeless tobacco use includes snuff. He reports that he does not drink alcohol and does not use drugs. ? ?Allergies  ?Allergen Reactions  ? Metformin And Related   ?  GI upset  ? Penicillins Hives  ? ? ?Family History  ?Problem Relation Age of Onset  ? Diabetes Father   ? Heart failure Mother   ? Cancer Other   ? Heart attack Other   ? Anesthesia problems Neg Hx   ? Hypotension Neg Hx   ? Malignant hyperthermia Neg Hx   ? Pseudochol deficiency Neg Hx   ? ? ?Prior to Admission medications   ?Medication Sig Start Date End Date Taking? Authorizing Provider  ?amLODipine (NORVASC) 10 MG tablet Take 1 tablet (10 mg total) by mouth daily. 06/15/17   Aliene Beams, MD  ?atorvastatin (LIPITOR) 40 MG tablet Take 1 tablet (40 mg total) by mouth daily. 03/31/17   Aliene Beams, MD  ?BD VEO INSULIN SYRINGE U/F 31G X 15/64" 1 ML MISC  USE AS DIRECTED 06/08/17   Aliene Beams, MD  ?fenofibrate (TRICOR) 145 MG tablet Take 1 tablet (145 mg total) by mouth daily. 06/30/17   Aliene Beams, MD  ?glipiZIDE Sallyanne Kuster  XL) 10 MG 24 hr tablet Take 1 tablet (10 mg total) by mouth daily with breakfast. 04/16/17   Aliene Beams, MD  ?glucose blood (ONETOUCH VERIO) test strip TEST twice a day 05/04/17   Aliene Beams, MD  ?hydrochlorothiazide (HYDRODIURIL) 25 MG tablet Take 1 tablet (25 mg total) by mouth daily. 07/16/17   Aliene Beams, MD  ?Insulin Pen Needle (NOVOTWIST) 32G X 5 MM MISC Use two daily to inject Victoza and Toujeo. 04/25/15   Reather Littler, MD  ?Titus Dubin FLEXTOUCH 100 UNIT/ML Pen INJECT 40 UNITS SUBCUTANEOUSLY TWICE DAILY 09/20/17   Aliene Beams, MD  ?lisinopril (PRINIVIL,ZESTRIL) 40 MG tablet Take 1 tablet (40 mg total) by mouth daily. 05/12/17   Aliene Beams, MD  ?Dola Argyle LANCETS 33G MISC Use to check blood sugar once a day dx code E11.65  11/21/14   Reather Littler, MD  ?sildenafil (VIAGRA) 100 MG tablet Take 1 tablet (100 mg total) by mouth daily as needed for erectile dysfunction. 03/31/17   Aliene Beams, MD  ?traZODone (DESYREL) 50 MG tablet Take 0.5-1 tablets (25-50 mg total) by mouth at bedtime as needed for sleep. 07/16/17   Aliene Beams, MD  ? ? ?Physical Exam: ?Vitals:  ? 04/28/21 1444 04/28/21 1530 04/28/21 1630 04/28/21 1800  ?BP: (!) 156/85 (!) 145/81 (!) 165/94 (!) 146/86  ?Pulse: 77 82 81 84  ?Resp: (!) 24 16 18 12   ?Temp: 98.3 ?F (36.8 ?C)     ?TempSrc: Oral     ?SpO2: 95% 94% 98% 100%  ?Weight:      ?Height:      ? ?1.  General: ?Patient lying supine in bed,  no acute distress ?  ?2. Psychiatric: ?Alert and oriented x 3, mood and behavior normal for situation, pleasant and cooperative with exam ?  ?3. Neurologic: ?Speech is slurred, language is normal, face is symmetric, moves all 4 extremities voluntarily, equal sensation in the extremities bilaterally, equal and normal strength in the all 4 extremities, tongue protrudes midline, visual fields intact ?  ?4. HEENMT:  ?Head is atraumatic, normocephalic, pupils reactive to light, neck is supple, trachea is midline, mucous membranes are moist ?  ?5. Respiratory : ?Lungs are clear to auscultation bilaterally without wheezing, rhonchi, rales, no cyanosis, no increase in work of breathing or accessory muscle use ?  ?6. Cardiovascular : ?Heart rate normal, rhythm is regular, no murmurs, rubs or gallops, no peripheral edema, peripheral pulses palpated ?  ?7. Gastrointestinal:  ?Abdomen is soft, nondistended, nontender to palpation bowel sounds active, no masses or organomegaly palpated ?  ?8. Skin:  ?Skin is warm, dry and intact without rashes, acute lesions, or ulcers on limited exam ?  ?9.Musculoskeletal:  ?No acute deformities or trauma, no asymmetry in tone, no peripheral edema, peripheral pulses palpated, no tenderness to palpation in the extremities ? ?Data Reviewed: ?In the ED ?Temp 98.3,  heart rate 77-84, respiratory rate 12-24, blood pressure 146/86, maintaining oxygen saturations on room air ?No leukocytosis with a white blood cell count of 7.0, hemoglobin 14.0, platelets 199 ?Chemistry is unremarkable ?Glucose 178 at admission ?UA is negative, UDS is negative ?CT head showed right basal ganglia and left corona radiata age-indeterminate infarcts ?MR angio shows acute small left frontal subcortical infarct ?Neurology recommends aspirin and Plavix daily without loading dose ?Patient was given 1 mg of Ativan, 500 mL normal saline bolus, 100 mL/h normal saline infusion ?Admission was requested for work-up of stroke ?Assessment and Plan: ?* Acute CVA (cerebrovascular accident) [facility]) ?- MR angio  shows acute small left frontal subcortical infarct.  Additional small chronic infarcts and chronic microvascular ischemic changes ?-Neurology consulted and recommends daily aspirin and Plavix ?-Echo in the a.m. ?-Monitor on telemetry ?-PT/OT/ST eval and treat ?-Continue to monitor ? ?Hyperlipidemia ?- Fenofibrate and atorvastatin ?-Lipid panel in the a.m. ? ?Essential hypertension ?- Holding amlodipine and lisinopril for permissive hypertension ?-Hydralazine 10 mg IV for blood pressure greater than 220 ? ? ?Uncontrolled type 2 diabetes mellitus with hyperglycemia, without long-term current use of insulin (HCC) ?- Last hemoglobin A1c 9.5 ?-Takes 44 units of Tresiba ?-Holding metformin and glipizide ?-Reduced insulin while in hospital- 25 units with sliding scale coverage ?-Carb modified diet ?-Monitor CBGs ? ? ? ? ? Advance Care Planning:   Code Status: Not on file full ? ?Consults: Neurology ? ?Family Communication: Wife and son at bedside ? ?Severity of Illness: ?The appropriate patient status for this patient is OBSERVATION. Observation status is judged to be reasonable and necessary in order to provide the required intensity of service to ensure the patient's safety. The patient's presenting symptoms,  physical exam findings, and initial radiographic and laboratory data in the context of their medical condition is felt to place them at decreased risk for further clinical deterioration. Furthermore, it is anticipat

## 2021-04-29 ENCOUNTER — Observation Stay (HOSPITAL_COMMUNITY): Payer: Medicare HMO

## 2021-04-29 ENCOUNTER — Other Ambulatory Visit (HOSPITAL_COMMUNITY): Payer: Self-pay | Admitting: *Deleted

## 2021-04-29 ENCOUNTER — Observation Stay (HOSPITAL_BASED_OUTPATIENT_CLINIC_OR_DEPARTMENT_OTHER): Payer: Medicare HMO

## 2021-04-29 DIAGNOSIS — I6389 Other cerebral infarction: Secondary | ICD-10-CM | POA: Diagnosis not present

## 2021-04-29 DIAGNOSIS — I6523 Occlusion and stenosis of bilateral carotid arteries: Secondary | ICD-10-CM | POA: Diagnosis not present

## 2021-04-29 DIAGNOSIS — I10 Essential (primary) hypertension: Secondary | ICD-10-CM | POA: Diagnosis not present

## 2021-04-29 DIAGNOSIS — E119 Type 2 diabetes mellitus without complications: Secondary | ICD-10-CM | POA: Diagnosis not present

## 2021-04-29 DIAGNOSIS — I639 Cerebral infarction, unspecified: Secondary | ICD-10-CM | POA: Diagnosis not present

## 2021-04-29 DIAGNOSIS — E782 Mixed hyperlipidemia: Secondary | ICD-10-CM | POA: Diagnosis not present

## 2021-04-29 LAB — HEMOGLOBIN A1C
Hgb A1c MFr Bld: 6.2 % — ABNORMAL HIGH (ref 4.8–5.6)
Mean Plasma Glucose: 131.24 mg/dL

## 2021-04-29 LAB — ECHOCARDIOGRAM COMPLETE
Area-P 1/2: 3.12 cm2
Height: 72 in
S' Lateral: 2.7 cm
Weight: 5058.23 oz

## 2021-04-29 LAB — LIPID PANEL
Cholesterol: 117 mg/dL (ref 0–200)
HDL: 32 mg/dL — ABNORMAL LOW (ref >40)
LDL Cholesterol: 66 mg/dL (ref 0–99)
Total CHOL/HDL Ratio: 3.7 RATIO
Triglycerides: 93 mg/dL (ref <150)
VLDL: 19 mg/dL (ref 0–40)

## 2021-04-29 LAB — GLUCOSE, CAPILLARY
Glucose-Capillary: 172 mg/dL — ABNORMAL HIGH (ref 70–99)
Glucose-Capillary: 269 mg/dL — ABNORMAL HIGH (ref 70–99)
Glucose-Capillary: 71 mg/dL (ref 70–99)

## 2021-04-29 LAB — COMPREHENSIVE METABOLIC PANEL
ALT: 22 U/L (ref 0–44)
AST: 20 U/L (ref 15–41)
Albumin: 3.6 g/dL (ref 3.5–5.0)
Alkaline Phosphatase: 34 U/L — ABNORMAL LOW (ref 38–126)
Anion gap: 4 — ABNORMAL LOW (ref 5–15)
BUN: 17 mg/dL (ref 6–20)
CO2: 26 mmol/L (ref 22–32)
Calcium: 8.7 mg/dL — ABNORMAL LOW (ref 8.9–10.3)
Chloride: 112 mmol/L — ABNORMAL HIGH (ref 98–111)
Creatinine, Ser: 0.7 mg/dL (ref 0.61–1.24)
GFR, Estimated: >60 mL/min (ref >60)
Glucose, Bld: 85 mg/dL (ref 70–99)
Potassium: 4 mmol/L (ref 3.5–5.1)
Sodium: 142 mmol/L (ref 135–145)
Total Bilirubin: 0.5 mg/dL (ref 0.3–1.2)
Total Protein: 6.8 g/dL (ref 6.5–8.1)

## 2021-04-29 LAB — CBC
HCT: 40.5 % (ref 39.0–52.0)
Hemoglobin: 13.5 g/dL (ref 13.0–17.0)
MCH: 28.8 pg (ref 26.0–34.0)
MCHC: 33.3 g/dL (ref 30.0–36.0)
MCV: 86.4 fL (ref 80.0–100.0)
Platelets: 196 10*3/uL (ref 150–400)
RBC: 4.69 MIL/uL (ref 4.22–5.81)
RDW: 12.4 % (ref 11.5–15.5)
WBC: 5.7 10*3/uL (ref 4.0–10.5)
nRBC: 0 % (ref 0.0–0.2)

## 2021-04-29 LAB — HIV ANTIBODY (ROUTINE TESTING W REFLEX): HIV Screen 4th Generation wRfx: NONREACTIVE

## 2021-04-29 MED ORDER — INSULIN ASPART 100 UNIT/ML IJ SOLN
0.0000 [IU] | Freq: Three times a day (TID) | INTRAMUSCULAR | Status: DC
  Administered 2021-04-29: 3 [IU] via SUBCUTANEOUS

## 2021-04-29 MED ORDER — ROSUVASTATIN CALCIUM 40 MG PO TABS: 40.0000 mg | ORAL_TABLET | Freq: Every day | ORAL | 1 refills | Status: AC

## 2021-04-29 MED ORDER — INSULIN ASPART 100 UNIT/ML IJ SOLN
0.0000 [IU] | Freq: Every day | INTRAMUSCULAR | Status: DC
  Administered 2021-04-29: 3 [IU] via SUBCUTANEOUS

## 2021-04-29 MED ORDER — FENOFIBRATE 54 MG PO TABS: 54.0000 mg | ORAL_TABLET | Freq: Every day | ORAL | Status: DC

## 2021-04-29 MED ORDER — SENNOSIDES-DOCUSATE SODIUM 8.6-50 MG PO TABS: 1.0000 | ORAL_TABLET | Freq: Every evening | ORAL | Status: DC | PRN

## 2021-04-29 MED ORDER — ONDANSETRON HCL 4 MG/2ML IJ SOLN: 4.0000 mg | Freq: Four times a day (QID) | INTRAMUSCULAR | Status: DC | PRN

## 2021-04-29 MED ORDER — TRAZODONE HCL 50 MG PO TABS: 25.0000 mg | ORAL_TABLET | Freq: Every evening | ORAL | Status: DC | PRN

## 2021-04-29 MED ORDER — PERFLUTREN LIPID MICROSPHERE
1.0000 mL | INTRAVENOUS | Status: AC | PRN
  Administered 2021-04-29: 3 mL via INTRAVENOUS

## 2021-04-29 MED ORDER — INSULIN DETEMIR 100 UNIT/ML ~~LOC~~ SOLN
25.0000 [IU] | Freq: Every day | SUBCUTANEOUS | Status: DC
  Administered 2021-04-29: 25 [IU] via SUBCUTANEOUS
  Filled 2021-04-29 (×2): qty 0.25

## 2021-04-29 MED ORDER — ATORVASTATIN CALCIUM 40 MG PO TABS
40.0000 mg | ORAL_TABLET | Freq: Every day | ORAL | Status: DC
  Administered 2021-04-29: 40 mg via ORAL
  Filled 2021-04-29: qty 1

## 2021-04-29 MED ORDER — ACETAMINOPHEN 650 MG RE SUPP: 650.0000 mg | RECTAL | Status: DC | PRN

## 2021-04-29 MED ORDER — CLOPIDOGREL BISULFATE 75 MG PO TABS: 75.0000 mg | ORAL_TABLET | Freq: Every day | ORAL | 0 refills | Status: DC

## 2021-04-29 MED ORDER — HEPARIN SODIUM (PORCINE) 5000 UNIT/ML IJ SOLN
5000.0000 [IU] | Freq: Three times a day (TID) | INTRAMUSCULAR | Status: DC
  Administered 2021-04-29: 5000 [IU] via SUBCUTANEOUS
  Filled 2021-04-29: qty 1

## 2021-04-29 MED ORDER — STROKE: EARLY STAGES OF RECOVERY BOOK: Freq: Once | Status: AC

## 2021-04-29 MED ORDER — ACETAMINOPHEN 160 MG/5ML PO SOLN: 650.0000 mg | ORAL | Status: DC | PRN

## 2021-04-29 MED ORDER — ACETAMINOPHEN 325 MG PO TABS: 650.0000 mg | ORAL_TABLET | ORAL | Status: DC | PRN

## 2021-04-29 NOTE — Evaluation (Addendum)
Physical Therapy Evaluation ?Patient Details ?Name: Troy Randall ?MRN: [id number] ?DOB: Aug 15, 1960 ?Today's Date: 04/29/2021 ? ?History of Present Illness ? Troy Randall is a 61 y.o. male with medical history significant of with history of hypertension, hyperlipidemia, diabetes mellitus type 2, and more presents to ED with a chief complaint of slurred speech.  Patient reports that he woke up normal this morning, and then at 10 AM he began to have slurred speech.  Patient denies any headache, change in vision, change in hearing, weakness, dysphagia, numbness/tingling.  He reports no trouble with word finding, just with slurred speech.  Patient reports he has had this happen to him before so he did not think much of it.  His family notices slurred speech and advised him to come to the ER.  3 hours after they advised him to he decided to come to the ER.  He continues to have slurred speech at admission.  He still has no associated complaints.  Patient has no other complaints at this time. ?  ?Clinical Impression ? Patient demonstrates ability to complete supine to sit and bed to chair transfers independently and with no assistance. Strength is close to WNL with current presentation. Patient is able to ambulate independently with on increase in sx while maintaining safety while demonstrating ability to participate as a community ambulator. Patient reports no decrease in functional status and is comfortable returning home and participating in previous ADL's. Discharge is recommended at this point in time after initial evaluation and current presentation with functional activities.    ?   ? ?Recommendations for follow up therapy are one component of a multi-disciplinary discharge planning process, led by the attending physician.  Recommendations may be updated based on patient status, additional functional criteria and insurance authorization. ? ?Follow Up Recommendations No PT follow up ? ?  ?Assistance Recommended at  Discharge None  ?Patient can return home with the following ?   ? ?  ?Equipment Recommendations None recommended by PT  ?Recommendations for Other Services ?    ?  ?Functional Status Assessment Patient has not had a recent decline in their functional status  ? ?  ?Precautions / Restrictions Precautions ?Precautions: None ?Restrictions ?Weight Bearing Restrictions: No  ? ?  ? ?Mobility ? Bed Mobility ?Overal bed mobility: Independent ?  ?  ?  ?  ?  ?  ?  ?  ? ?Transfers ?Overall transfer level: Independent ?  ?  ?  ?  ?  ?  ?  ?  ?General transfer comment: as per OT notes ?  ? ?Ambulation/Gait ?  ?Gait Distance (Feet): 200 Feet ?  ?  ?Gait velocity: normal ?  ?  ?  ? ?Stairs ?  ?  ?  ?  ?  ? ?Wheelchair Mobility ?  ? ?Modified Rankin (Stroke Patients Only) ?  ? ?  ? ?Balance   ?  ?  ?  ?  ?  ?  ?  ?  ?  ?  ?  ?  ?  ?  ?  ?  ?  ?  ?   ? ? ? ?Pertinent Vitals/Pain    ? ? ?Home Living Family/patient expects to be discharged to:: Private residence ?Living Arrangements: Spouse/significant other ?Available Help at Discharge: Family;Available 24 hours/day ?Type of Home: House ?Home Access: Stairs to enter ?Entrance Stairs-Rails: None ?Entrance Stairs-Number of Steps: 1 ?Alternate Level Stairs-Number of Steps: 12 ?Home Layout: Two level;Able to live on main level with bedroom/bathroom ?  Home Equipment: Agricultural consultant (2 wheels);Cane - single point;BSC/3in1;Wheelchair - manual;Transport chair;Grab bars - toilet ?   ?  ?Prior Function Prior Level of Function : Independent/Modified Independent ?  ?  ?  ?  ?  ?  ?Mobility Comments: independent ambulation; pt drives as per OT notes ?ADLs Comments: Independent as per OT notes ?  ? ? ?Hand Dominance  ? Dominant Hand: Right ? ?  ?Extremity/Trunk Assessment  ? Upper Extremity Assessment ?Upper Extremity Assessment: Defer to OT evaluation ?  ? ?Lower Extremity Assessment ?Lower Extremity Assessment: Overall WFL for tasks assessed ?  ? ?Cervical / Trunk Assessment ?Cervical / Trunk  Assessment: Normal  ?Communication  ? Communication: No difficulties  ?Cognition   ?  ?  ?  ?  ?  ?  ?  ?  ?  ?  ?  ?  ?  ?  ?  ?  ?  ?  ?  ?  ?  ? ?  ?General Comments   ? ?  ?Exercises    ? ?Assessment/Plan  ?  ?PT Assessment Patient does not need any further PT services  ?PT Problem List   ? ?   ?  ?PT Treatment Interventions     ? ?PT Goals (Current goals can be found in the Care Plan section)  ?Acute Rehab PT Goals ?Patient Stated Goal: return home ?PT Goal Formulation: With patient ?Time For Goal Achievement: 05/13/21 ?Potential to Achieve Goals: Good ? ?  ?Frequency   ?  ? ? ?Co-evaluation PT/OT/SLP Co-Evaluation/Treatment: Yes ?Reason for Co-Treatment: To address functional/ADL transfers ?PT goals addressed during session: Mobility/safety with mobility;Balance;Strengthening/ROM;Proper use of DME ?OT goals addressed during session: ADL's and self-care ?  ? ? ?  ?AM-PAC PT "6 Clicks" Mobility  ?Outcome Measure Help needed turning from your back to your side while in a flat bed without using bedrails?: None ?Help needed moving from lying on your back to sitting on the side of a flat bed without using bedrails?: None ?Help needed moving to and from a bed to a chair (including a wheelchair)?: None ?Help needed standing up from a chair using your arms (e.g., wheelchair or bedside chair)?: A Little ?Help needed to walk in hospital room?: None ?Help needed climbing 3-5 steps with a railing? : None ?6 Click Score: 23 ? ?  ?End of Session   ?  ?Patient left: in chair ?Nurse Communication: Mobility status ?PT Visit Diagnosis: Unsteadiness on feet (R26.81);Other abnormalities of gait and mobility (R26.89);Muscle weakness (generalized) (M62.81) ?  ? ?Time: 3382-5053 ?PT Time Calculation (min) (ACUTE ONLY): 15 min ? ? ?Charges:   PT Evaluation ?$PT Eval Low Complexity: 1 Low ?PT Treatments ?$Therapeutic Activity: 8-22 mins ?  ?   ? ? ?12:54 PM, 04/29/21 ?Arther Abbott, S/PT  ? ?During this treatment session, the  therapist was present, participating in and directing the treatment. ? ?12:54 PM, 04/29/21 ?Ocie Bob, MPT ?Physical Therapist with [place] ?[facility] ?[phone] office ?[phone] mobile phone ? ? ?

## 2021-04-29 NOTE — Progress Notes (Signed)
Pt discharged via WC to POV. Discharge instructions reviewed with both pt and wife, questions fielded and answered to their satisfaction. Reviewed reinforced ischemic stroke education/information with both pt and wife, both able to give appropriate verbal return of information. ?

## 2021-04-29 NOTE — Evaluation (Signed)
Speech Language Pathology Evaluation ?Patient Details ?Name: Troy Randall ?MRN: [id number] ?DOB: [birth year] ?Today's Date: 04/29/2021 ?Time: [id number] ?SLP Time Calculation (min) (ACUTE ONLY): 24 min ? ?Problem List:  ?Patient Active Problem List  ? Diagnosis Date Noted  ? Acute CVA (cerebrovascular accident) ([place]) 04/28/2021  ? Cannot sleep 08/26/2015  ? Chronic right-sided low back pain with sciatica 07/10/2015  ? Uncontrolled type 2 diabetes mellitus with hyperglycemia, without long-term current use of insulin ([place]) 06/19/2015  ? Hyperlipidemia 11/29/2013  ? Essential hypertension 11/28/2013  ? ?Past Medical History:  ?Past Medical History:  ?Diagnosis Date  ? Anemia   ? Arthritis   ? Diabetes mellitus without complication ([place])   ? Hyperlipidemia   ? Hypertension   ? Kidney stones   ? ?Past Surgical History:  ?Past Surgical History:  ?Procedure Laterality Date  ? ANKLE SURGERY    ? HERNIA REPAIR    ? umbilical x1 Incisional x1  ? INCISIONAL HERNIA REPAIR  04/13/2011  ? Procedure: LAPAROSCOPIC INCISIONAL HERNIA;  Surgeon: Jamesetta So, MD;  Location: [facility];  Service: General;  Laterality: N/A;  Recurrent Laparoscopic Incisional Herniorraphy with Mesh  ? KIDNEY STONE SURGERY    ? ?HPI:  ?Troy Randall is a 61 y.o. male with medical history significant of with history of hypertension, hyperlipidemia, diabetes mellitus type 2, and more presents to ED with a chief complaint of slurred speech.  Patient reports that he woke up normal this morning, and then at 10 AM he began to have slurred speech.  Patient denies any headache, change in vision, change in hearing, weakness, dysphagia, numbness/tingling.  He reports no trouble with word finding, just with slurred speech.  Patient reports he has had this happen to him before so he did not think much of it.  His family notices slurred speech and advised him to come to the ER.  3 hours after they advised him to he decided to come to the ER.  He continues to have slurred  speech at admission.  He still has no associated complaints.  Patient has no other complaints at this time.  ? ?Assessment / Plan / Recommendation ?Clinical Impression ? Speech language evaluation completed; Pt presents with mild dysarthria with intellitibility slightly reduced (75-100% accurate). Pt's cognition was assessed via portions of the [place] revealing pt's cognition to be WNL. Processing speed may be slightly reduced and word finding may also be slightly slower than baseline, however he does find all words accurately with a brief delay. SLP reviewed strategies for dysarthria including slowed speech and over articulation - Pt demonstrated implementation of these strategies even during our evaluation after review. Pt does not want any ST f/u. There are no further ST needs at this time, thank you. ?   ?SLP Assessment ? SLP Visit Diagnosis: Dysarthria and anarthria (R47.1)  ?  ?Recommendations for follow up therapy are one component of a multi-disciplinary discharge planning process, led by the attending physician.  Recommendations may be updated based on patient status, additional functional criteria and insurance authorization. ?   ?Follow Up Recommendations ? No SLP follow up  ?  ?   ?   ?   ?   ?SLP Evaluation ?Cognition ? Overall Cognitive Status: Within Functional Limits for tasks assessed  ?  ?   ?Comprehension ? Auditory Comprehension ?Overall Auditory Comprehension: Appears within functional limits for tasks assessed ?Visual Recognition/Discrimination ?Discrimination: Not tested ?Reading Comprehension ?Reading Status: Not tested  ?  ?Expression Expression ?Primary Mode  of Expression: Verbal ?Verbal Expression ?Overall Verbal Expression: Appears within functional limits for tasks assessed ?Written Expression ?Dominant Hand: Right   ?Oral / Motor ? Oral Motor/Sensory Function ?Overall Oral Motor/Sensory Function: Within functional limits ?Motor Speech ?Overall Motor Speech: Impaired ?Articulation:  Impaired ?Level of Impairment: Word ?Intelligibility: Intelligibility reduced ?Word: 75-100% accurate ?Phrase: 75-100% accurate ?Sentence: 75-100% accurate ?Conversation: 75-100% accurate   ?        ? ? ?Adrian Specht H. Izora Ribas MA, CCC-SLP ?Speech Language Pathologist ? ?Wende Bushy ?04/29/2021, 10:09 AM ? ?

## 2021-04-29 NOTE — Progress Notes (Signed)
Pt up to bathroom with only standby assist, tolerated well, no gait issues. Denies c/o. Only deficit noted is some difficulty starting words with slight slurring at times. VSS, NSR on monitor. ?

## 2021-04-29 NOTE — Care Management Obs Status (Signed)
MEDICARE OBSERVATION STATUS NOTIFICATION ? ? ?Patient Details  ?Name: Troy Randall ?MRN: [id number] ?Date of Birth: 18-Feb-1960 ? ? ?Medicare Observation Status Notification Given:  Yes ? ? ? ?Corey Harold ?04/29/2021, 3:59 PM ?

## 2021-04-29 NOTE — Discharge Summary (Signed)
Physician Discharge Summary  ?TROY KANOUSE [id number] DOB: [birth year] DOA: 04/28/2021 ? ?PCP: Benita Stabile, MD ? ?Admit date: 04/28/2021 ?Discharge date: 04/29/2021 ? ?Admitted From: Home ?Disposition: Home ? ?Recommendations for Outpatient Follow-up:  ?Follow up with PCP in 1-2 weeks ?Please obtain BMP/CBC in one week ? ?Home Health: None ?Equipment/Devices: None ? ?Discharge Condition: Stable ?CODE STATUS: Full ?Diet recommendation: Low-salt low-fat low-carb diet ? ?Brief/Interim Summary: ?Troy Randall is a 61 y.o. male with medical history significant of with history of hypertension, hyperlipidemia, diabetes mellitus type 2, and more presents to ED with a chief complaint of slurred speech.  Patient reports that he woke up normal this morning, and then at 10 AM he began to have slurred speech.  Patient denies any headache, change in vision, change in hearing, weakness, dysphagia, numbness/tingling.  He reports no trouble with word finding, just with slurred speech.  Patient reports he has had this happen to him before so he did not think much of it.  His family notices slurred speech and advised him to come to the ER.  3 hours after they advised him to he decided to come to the ER.  He continues to have slurred speech at admission.  He still has no associated complaints.  Patient has no other complaints at this time. ? ?Acute CVA, small left frontal subcortical infarct noted on imaging, symptoms appear to resolved.  Continue aspirin, Plavix, high intensity statin at discharge.  PT OT speech evaluation unremarkable.  Carotid ultrasound shows minimal to no stenosis, echocardiogram completed, discussed the patient can follow this report in the outpatient setting. ? ?Patient otherwise stable and agreeable for discharge home ? ?Discharge Diagnoses:  ?Principal Problem: ?  Acute CVA (cerebrovascular accident) ([facility]) ?Active Problems: ?  Uncontrolled type 2 diabetes mellitus with hyperglycemia, without long-term  current use of insulin ([facility]) ?  Essential hypertension ?  Hyperlipidemia ? ? ? ?Discharge Instructions ? ?Discharge Instructions   ? ? Discharge patient   Complete by: As directed ?  ? Discharge disposition: 01-Home or Self Care  ? Discharge patient date: 04/29/2021  ? ?  ? ?Allergies as of 04/29/2021   ? ?   Reactions  ? Metformin And Related   ? GI upset  ? Penicillins Hives  ? ?  ? ?  ?Medication List  ?  ? ?STOP taking these medications   ? ?atorvastatin 40 MG tablet ?Commonly known as: LIPITOR ?  ?hydrochlorothiazide 25 MG tablet ?Commonly known as: HYDRODIURIL ?  ?naproxen sodium 220 MG tablet ?Commonly known as: ALEVE ?  ?sildenafil 100 MG tablet ?Commonly known as: Viagra ?  ? ?  ? ?TAKE these medications   ? ?amLODipine 10 MG tablet ?Commonly known as: NORVASC ?Take 1 tablet (10 mg total) by mouth daily. ?  ?aspirin EC 81 MG tablet ?Take 81 mg by mouth daily. Swallow whole. ?  ?BD Veo Insulin Syringe U/F 31G X 15/64" 1 ML Misc ?Generic drug: Insulin Syringe-Needle U-100 ?USE AS DIRECTED ?  ?clopidogrel 75 MG tablet ?Commonly known as: PLAVIX ?Take 1 tablet (75 mg total) by mouth daily. ?Start taking on: April 30, 2021 ?  ?fenofibrate 145 MG tablet ?Commonly known as: Tricor ?Take 1 tablet (145 mg total) by mouth daily. ?  ?glipiZIDE 10 MG 24 hr tablet ?Commonly known as: Glucotrol XL ?Take 1 tablet (10 mg total) by mouth daily with breakfast. ?  ?glucose blood test strip ?Commonly known as: OneTouch Verio ?TEST twice a day ?  ?Insulin  Pen Needle 32G X 5 MM Misc ?Commonly known as: NovoTwist ?Use two daily to inject Victoza and Toujeo. ?  ?Levemir FlexTouch 100 UNIT/ML FlexPen ?Generic drug: insulin detemir ?INJECT 40 UNITS SUBCUTANEOUSLY TWICE DAILY ?  ?levocetirizine 5 MG tablet ?Commonly known as: XYZAL ?Take 5 mg by mouth daily. ?  ?lisinopril 40 MG tablet ?Commonly known as: ZESTRIL ?Take 1 tablet (40 mg total) by mouth daily. ?  ?metFORMIN 500 MG 24 hr tablet ?Commonly known as: GLUCOPHAGE-XR ?Take  500-1,000 mg by mouth in the morning and at bedtime. ?  ?OneTouch Delica Lancets 33G Misc ?Use to check blood sugar once a day dx code E11.65 ?  ?rosuvastatin 40 MG tablet ?Commonly known as: Crestor ?Take 1 tablet (40 mg total) by mouth daily. ?  ?tamsulosin 0.4 MG Caps capsule ?Commonly known as: FLOMAX ?Take 0.4 mg by mouth daily. ?  ?traZODone 150 MG tablet ?Commonly known as: DESYREL ?Take 150 mg by mouth at bedtime. ?What changed: Another medication with the same name was removed. Continue taking this medication, and follow the directions you see here. ?  ?[name] FlexTouch 200 UNIT/ML FlexTouch Pen ?Generic drug: insulin degludec ?Inject 20 Units into the skin in the morning and at bedtime. ?  ? ?  ? ? ?Allergies  ?Allergen Reactions  ? Metformin And Related   ?  GI upset  ? Penicillins Hives  ? ? ?Consultations: ?Neurology ? ?Procedures/Studies: ?CT HEAD WO CONTRAST ? ?Result Date: 04/28/2021 ?CLINICAL DATA:  Neuro deficit, acute, stroke suspected EXAM: CT HEAD WITHOUT CONTRAST TECHNIQUE: Contiguous axial images were obtained from the base of the skull through the vertex without intravenous contrast. RADIATION DOSE REDUCTION: This exam was performed according to the departmental dose-optimization program which includes automated exposure control, adjustment of the mA and/or kV according to patient size and/or use of iterative reconstruction technique. COMPARISON:  None. FINDINGS: Brain: Ill-defined hypodensities in the right basal ganglia and left corona radiata, compatible with age-indeterminate infarcts. No mass occupying acute hemorrhage. No significant mass effect or midline shift. No hydrocephalus or extra-axial fluid collection. Mega cisterna magna. Vascular: No hyperdense vessel identified. Skull: No acute fracture. Sinuses/Orbits: Clear sinuses.  No acute orbital findings. Other: No mastoid effusions. IMPRESSION: Ill-defined hypodensities in the right basal ganglia and left corona radiata, compatible  with age-indeterminate infarcts. See forthcoming/ordered MRI for further evaluation Electronically Signed   By: Feliberto Harts M.D.   On: 04/28/2021 15:49  ? ?MR ANGIO HEAD WO CONTRAST ? ?Result Date: 04/28/2021 ?CLINICAL DATA:  Neuro deficit, acute, stroke suspected EXAM: MRI HEAD WITHOUT CONTRAST MRA HEAD WITHOUT CONTRAST TECHNIQUE: Multiplanar, multi-echo pulse sequences of the brain and surrounding structures were acquired without intravenous contrast. Angiographic images of the Circle of Willis were acquired using MRA technique without intravenous contrast. COMPARISON:  No pertinent prior exam. FINDINGS: MRI HEAD Brain: Linear small focus of restricted diffusion in the left frontal subcortical white matter. This is just superior to several chronic infarcts involving the corona radiata extending toward the basal ganglia. Additional chronic small vessel infarcts the right basal ganglia and adjacent white matter, left parasagittal frontal subcortical white matter, and left cerebellum. Superimposed patchy foci of T2 hyperintensity in the supratentorial and pontine white matter are nonspecific but probably reflect mild chronic microvascular ischemic changes. No evidence of intracranial hemorrhage. Ventricles and sulci are within normal limits in size and configuration. Vascular: Major vessel flow voids at the skull base are preserved. Skull and upper cervical spine: Normal marrow signal is preserved. Sinuses/Orbits: Paranasal sinuses  are aerated. Orbits are unremarkable. Other: Sella is unremarkable.  Mastoid air cells are clear. MRA HEAD Intracranial internal carotid arteries are patent. Middle and anterior cerebral arteries are patent. Intracranial vertebral arteries, basilar artery, posterior cerebral arteries are patent. Bilateral posterior communicating arteries are present. There is no significant stenosis or aneurysm. IMPRESSION: Acute small left frontal subcortical infarct. Additional small chronic  infarcts and chronic microvascular ischemic changes. Electronically Signed   By: Guadlupe Spanish M.D.   On: 04/28/2021 16:54  ? ?MR BRAIN WO CONTRAST ? ?Result Date: 04/28/2021 ?CLINICAL DATA:  Neuro deficit, acute, st

## 2021-04-29 NOTE — Progress Notes (Signed)
*  PRELIMINARY RESULTS* ?Echocardiogram ?2D Echocardiogram has been performed with Definity. ? ?Stacey Drain ?04/29/2021, 12:20 PM ?

## 2021-04-29 NOTE — Evaluation (Signed)
Occupational Therapy Evaluation ?Patient Details ?Name: Troy Randall ?MRN: [id number] ?DOB: 22-Aug-1960 ?Today's Date: 04/29/2021 ? ? ?History of Present Illness Troy Randall is a 61 y.o. male with medical history significant of with history of hypertension, hyperlipidemia, diabetes mellitus type 2, and more presents to ED with a chief complaint of slurred speech.  Patient reports that he woke up normal this morning, and then at 10 AM he began to have slurred speech.  Patient denies any headache, change in vision, change in hearing, weakness, dysphagia, numbness/tingling.  He reports no trouble with word finding, just with slurred speech.  Patient reports he has had this happen to him before so he did not think much of it.  His family notices slurred speech and advised him to come to the ER.  3 hours after they advised him to he decided to come to the ER.  He continues to have slurred speech at admission.  He still has no associated complaints.  Patient has no other complaints at this time. (Per MD)  ? ?Clinical Impression ?  ?Pt agreeable to OT and PT co-evaluation. Pt appears to be at or near baseline levels for ADL's and functional mobility. Pt able to ambulate in hall independently and demonstrated WFL B UE functional use. Pt is not recommended for further acute OT services and will be discharged to care of nursing staff for remaining length of stay.  ?   ? ?Recommendations for follow up therapy are one component of a multi-disciplinary discharge planning process, led by the attending physician.  Recommendations may be updated based on patient status, additional functional criteria and insurance authorization.  ? ?Follow Up Recommendations ? No OT follow up  ?  ?Assistance Recommended at Discharge None  ?Patient can return home with the following   ? ?  ?Functional Status Assessment ? Patient has not had a recent decline in their functional status  ?Equipment Recommendations ? None recommended by OT  ?   ?Recommendations for Other Services   ? ? ?  ?Precautions / Restrictions Precautions ?Precautions: None ?Restrictions ?Weight Bearing Restrictions: No  ? ?  ? ?Mobility Bed Mobility ?Overal bed mobility: Independent ?  ?  ?  ?  ?  ?  ?  ?  ? ?Transfers ?Overall transfer level: Independent ?  ?  ?  ?  ?  ?  ?  ?  ?  ?  ? ?  ?Balance Overall balance assessment: Independent ?  ?  ?  ?  ?  ?  ?  ?  ?  ?  ?  ?  ?  ?  ?  ?  ?  ?  ?   ? ?ADL either performed or assessed with clinical judgement  ? ?ADL Overall ADL's : Independent ?  ?  ?  ?  ?  ?  ?  ?  ?  ?  ?  ?  ?  ?  ?  ?  ?  ?  ?  ?   ? ? ? ?Vision Baseline Vision/History: 0 No visual deficits ?Ability to See in Adequate Light: 0 Adequate ?Patient Visual Report: No change from baseline ?Vision Assessment?: No apparent visual deficits  ?   ?Perception   ?  ?Praxis   ?  ? ?Pertinent Vitals/Pain Pain Assessment ?Pain Assessment: No/denies pain  ? ? ? ?Hand Dominance Right ?  ?Extremity/Trunk Assessment Upper Extremity Assessment ?Upper Extremity Assessment: Overall WFL for tasks assessed ?  ?Lower Extremity Assessment ?Lower  Extremity Assessment: Defer to PT evaluation ?  ?Cervical / Trunk Assessment ?Cervical / Trunk Assessment: Normal ?  ?Communication Communication ?Communication: No difficulties (Pt able to communicate well; stated his speech has improved since yesterday. Still does not appear to be baseline.) ?  ?Cognition Arousal/Alertness: Awake/alert ?Behavior During Therapy: [facility] for tasks assessed/performed ?Overall Cognitive Status: Within Functional Limits for tasks assessed ?  ?  ?  ?  ?  ?  ?  ?  ?  ?  ?  ?  ?  ?  ?  ?  ?  ?  ?  ?   ? ?  ?   ?  ?    ? ? ?Home Living Family/patient expects to be discharged to:: Private residence ?Living Arrangements: Spouse/significant other ?Available Help at Discharge: Family;Available 24 hours/day ?Type of Home: House ?Home Access: Stairs to enter ?Entrance Stairs-Number of Steps: 1 ?Entrance Stairs-Rails: None ?Home  Layout: Two level;Able to live on main level with bedroom/bathroom ?Alternate Level Stairs-Number of Steps: 12 (to basement) ?Alternate Level Stairs-Rails: Left (going down; also has lift chair) ?Bathroom Shower/Tub: Psychologist, counselling;Tub/shower unit ?  ?Bathroom Toilet: Handicapped height ?Bathroom Accessibility: Yes ?How Accessible: Accessible via walker ?Home Equipment: Agricultural consultant (2 wheels);Cane - single point;BSC/3in1;Wheelchair - manual;Transport chair;Grab bars - toilet ?  ?  ?  ? ?  ?Prior Functioning/Environment Prior Level of Function : Independent/Modified Independent ?  ?  ?  ?  ?  ?  ?Mobility Comments: independent ambulation; pt drives ?ADLs Comments: Independent ?  ? ?  ?  ?   ?  ?   ?    ?  ?OT Goals(Current goals can be found in the care plan section) Acute Rehab OT Goals ?Patient Stated Goal: return home  ?OT Frequency:   ?  ? ?Co-evaluation PT/OT/SLP Co-Evaluation/Treatment: Yes ?Reason for Co-Treatment: To address functional/ADL transfers ?  ?OT goals addressed during session: ADL's and self-care ?  ? ?  ?AM-PAC OT "6 Clicks" Daily Activity     ?Outcome Measure Help from another person eating meals?: None ?Help from another person taking care of personal grooming?: None ?Help from another person toileting, which includes using toliet, bedpan, or urinal?: None ?Help from another person bathing (including washing, rinsing, drying)?: None ?Help from another person to put on and taking off regular upper body clothing?: None ?Help from another person to put on and taking off regular lower body clothing?: None ?6 Click Score: 24 ?  ?End of Session   ? ?Activity Tolerance: Patient tolerated treatment well ?Patient left: in chair;with call bell/phone within reach ? ?OT Visit Diagnosis: Other symptoms and signs involving the nervous system (R29.898);Other (comment) (Aphasia)  ?              ?Time: 7494-4967 ?OT Time Calculation (min): 11 min ?Charges:  OT General Charges ?$OT Visit: 1 Visit ?OT  Evaluation ?$OT Eval Low Complexity: 1 Low ? ?Danie Chandler OT, MOT ? ?Danie Chandler ?04/29/2021, 9:32 AM ?

## 2021-05-05 DIAGNOSIS — Z6841 Body Mass Index (BMI) 40.0 and over, adult: Secondary | ICD-10-CM | POA: Diagnosis not present

## 2021-05-05 DIAGNOSIS — I10 Essential (primary) hypertension: Secondary | ICD-10-CM | POA: Diagnosis not present

## 2021-05-05 DIAGNOSIS — R809 Proteinuria, unspecified: Secondary | ICD-10-CM | POA: Diagnosis not present

## 2021-05-05 DIAGNOSIS — E1165 Type 2 diabetes mellitus with hyperglycemia: Secondary | ICD-10-CM | POA: Diagnosis not present

## 2021-05-05 DIAGNOSIS — M542 Cervicalgia: Secondary | ICD-10-CM | POA: Diagnosis not present

## 2021-05-05 DIAGNOSIS — E782 Mixed hyperlipidemia: Secondary | ICD-10-CM | POA: Diagnosis not present

## 2021-05-05 DIAGNOSIS — I6529 Occlusion and stenosis of unspecified carotid artery: Secondary | ICD-10-CM | POA: Diagnosis not present

## 2021-05-05 DIAGNOSIS — I69320 Aphasia following cerebral infarction: Secondary | ICD-10-CM | POA: Diagnosis not present

## 2021-06-09 DIAGNOSIS — I10 Essential (primary) hypertension: Secondary | ICD-10-CM | POA: Diagnosis not present

## 2021-07-25 DIAGNOSIS — E782 Mixed hyperlipidemia: Secondary | ICD-10-CM | POA: Diagnosis not present

## 2021-07-25 DIAGNOSIS — E1165 Type 2 diabetes mellitus with hyperglycemia: Secondary | ICD-10-CM | POA: Diagnosis not present

## 2021-07-30 DIAGNOSIS — E782 Mixed hyperlipidemia: Secondary | ICD-10-CM | POA: Diagnosis not present

## 2021-07-30 DIAGNOSIS — R944 Abnormal results of kidney function studies: Secondary | ICD-10-CM | POA: Diagnosis not present

## 2021-07-30 DIAGNOSIS — R800 Isolated proteinuria: Secondary | ICD-10-CM | POA: Diagnosis not present

## 2021-07-30 DIAGNOSIS — N401 Enlarged prostate with lower urinary tract symptoms: Secondary | ICD-10-CM | POA: Diagnosis not present

## 2021-07-30 DIAGNOSIS — M19019 Primary osteoarthritis, unspecified shoulder: Secondary | ICD-10-CM | POA: Diagnosis not present

## 2021-07-30 DIAGNOSIS — M5136 Other intervertebral disc degeneration, lumbar region: Secondary | ICD-10-CM | POA: Diagnosis not present

## 2021-07-30 DIAGNOSIS — J310 Chronic rhinitis: Secondary | ICD-10-CM | POA: Diagnosis not present

## 2021-07-30 DIAGNOSIS — E1165 Type 2 diabetes mellitus with hyperglycemia: Secondary | ICD-10-CM | POA: Diagnosis not present

## 2021-07-30 DIAGNOSIS — J329 Chronic sinusitis, unspecified: Secondary | ICD-10-CM | POA: Diagnosis not present

## 2021-07-30 DIAGNOSIS — G4700 Insomnia, unspecified: Secondary | ICD-10-CM | POA: Diagnosis not present

## 2021-07-30 DIAGNOSIS — E291 Testicular hypofunction: Secondary | ICD-10-CM | POA: Diagnosis not present

## 2021-08-13 ENCOUNTER — Encounter (INDEPENDENT_AMBULATORY_CARE_PROVIDER_SITE_OTHER): Payer: Self-pay

## 2021-09-04 ENCOUNTER — Other Ambulatory Visit: Payer: Self-pay

## 2021-09-04 ENCOUNTER — Inpatient Hospital Stay (HOSPITAL_COMMUNITY)
Admission: EM | Admit: 2021-09-04 | Discharge: 2021-10-30 | DRG: 004 | Disposition: A | Discharge status: Rehabilitation Facility | Payer: Medicare HMO | Attending: Internal Medicine | Admitting: Internal Medicine

## 2021-09-04 ENCOUNTER — Encounter (HOSPITAL_COMMUNITY): Payer: Self-pay

## 2021-09-04 DIAGNOSIS — K633 Ulcer of intestine: Secondary | ICD-10-CM | POA: Diagnosis not present

## 2021-09-04 DIAGNOSIS — F05 Delirium due to known physiological condition: Secondary | ICD-10-CM | POA: Diagnosis not present

## 2021-09-04 DIAGNOSIS — K449 Diaphragmatic hernia without obstruction or gangrene: Secondary | ICD-10-CM | POA: Diagnosis present

## 2021-09-04 DIAGNOSIS — Z833 Family history of diabetes mellitus: Secondary | ICD-10-CM

## 2021-09-04 DIAGNOSIS — Z6841 Body Mass Index (BMI) 40.0 and over, adult: Secondary | ICD-10-CM

## 2021-09-04 DIAGNOSIS — G379 Demyelinating disease of central nervous system, unspecified: Secondary | ICD-10-CM | POA: Diagnosis present

## 2021-09-04 DIAGNOSIS — E872 Acidosis, unspecified: Secondary | ICD-10-CM | POA: Diagnosis not present

## 2021-09-04 DIAGNOSIS — E876 Hypokalemia: Secondary | ICD-10-CM | POA: Diagnosis not present

## 2021-09-04 DIAGNOSIS — N179 Acute kidney failure, unspecified: Secondary | ICD-10-CM

## 2021-09-04 DIAGNOSIS — B9562 Methicillin resistant Staphylococcus aureus infection as the cause of diseases classified elsewhere: Secondary | ICD-10-CM | POA: Diagnosis not present

## 2021-09-04 DIAGNOSIS — D500 Iron deficiency anemia secondary to blood loss (chronic): Secondary | ICD-10-CM | POA: Diagnosis not present

## 2021-09-04 DIAGNOSIS — R4182 Altered mental status, unspecified: Secondary | ICD-10-CM | POA: Diagnosis not present

## 2021-09-04 DIAGNOSIS — D62 Acute posthemorrhagic anemia: Secondary | ICD-10-CM

## 2021-09-04 DIAGNOSIS — R0689 Other abnormalities of breathing: Secondary | ICD-10-CM

## 2021-09-04 DIAGNOSIS — K921 Melena: Secondary | ICD-10-CM | POA: Diagnosis not present

## 2021-09-04 DIAGNOSIS — Z930 Tracheostomy status: Secondary | ICD-10-CM

## 2021-09-04 DIAGNOSIS — I672 Cerebral atherosclerosis: Secondary | ICD-10-CM | POA: Diagnosis not present

## 2021-09-04 DIAGNOSIS — R0902 Hypoxemia: Secondary | ICD-10-CM | POA: Diagnosis not present

## 2021-09-04 DIAGNOSIS — Y738 Miscellaneous gastroenterology and urology devices associated with adverse incidents, not elsewhere classified: Secondary | ICD-10-CM | POA: Diagnosis not present

## 2021-09-04 DIAGNOSIS — K3189 Other diseases of stomach and duodenum: Secondary | ICD-10-CM | POA: Diagnosis not present

## 2021-09-04 DIAGNOSIS — R0603 Acute respiratory distress: Secondary | ICD-10-CM | POA: Diagnosis not present

## 2021-09-04 DIAGNOSIS — J9621 Acute and chronic respiratory failure with hypoxia: Secondary | ICD-10-CM | POA: Diagnosis not present

## 2021-09-04 DIAGNOSIS — K5713 Diverticulitis of small intestine without perforation or abscess with bleeding: Principal | ICD-10-CM | POA: Diagnosis present

## 2021-09-04 DIAGNOSIS — G9389 Other specified disorders of brain: Secondary | ICD-10-CM | POA: Diagnosis not present

## 2021-09-04 DIAGNOSIS — R140 Abdominal distension (gaseous): Secondary | ICD-10-CM | POA: Diagnosis not present

## 2021-09-04 DIAGNOSIS — N281 Cyst of kidney, acquired: Secondary | ICD-10-CM | POA: Diagnosis not present

## 2021-09-04 DIAGNOSIS — K810 Acute cholecystitis: Secondary | ICD-10-CM

## 2021-09-04 DIAGNOSIS — K529 Noninfective gastroenteritis and colitis, unspecified: Secondary | ICD-10-CM | POA: Diagnosis present

## 2021-09-04 DIAGNOSIS — R0602 Shortness of breath: Secondary | ICD-10-CM | POA: Diagnosis not present

## 2021-09-04 DIAGNOSIS — K559 Vascular disorder of intestine, unspecified: Secondary | ICD-10-CM | POA: Diagnosis not present

## 2021-09-04 DIAGNOSIS — N200 Calculus of kidney: Secondary | ICD-10-CM | POA: Diagnosis present

## 2021-09-04 DIAGNOSIS — K828 Other specified diseases of gallbladder: Secondary | ICD-10-CM | POA: Diagnosis not present

## 2021-09-04 DIAGNOSIS — N401 Enlarged prostate with lower urinary tract symptoms: Secondary | ICD-10-CM | POA: Diagnosis present

## 2021-09-04 DIAGNOSIS — K5791 Diverticulosis of intestine, part unspecified, without perforation or abscess with bleeding: Secondary | ICD-10-CM | POA: Diagnosis not present

## 2021-09-04 DIAGNOSIS — G259 Extrapyramidal and movement disorder, unspecified: Secondary | ICD-10-CM | POA: Diagnosis present

## 2021-09-04 DIAGNOSIS — E8809 Other disorders of plasma-protein metabolism, not elsewhere classified: Secondary | ICD-10-CM | POA: Diagnosis not present

## 2021-09-04 DIAGNOSIS — Z9889 Other specified postprocedural states: Secondary | ICD-10-CM | POA: Diagnosis not present

## 2021-09-04 DIAGNOSIS — Z4901 Encounter for fitting and adjustment of extracorporeal dialysis catheter: Secondary | ICD-10-CM | POA: Diagnosis not present

## 2021-09-04 DIAGNOSIS — Z434 Encounter for attention to other artificial openings of digestive tract: Secondary | ICD-10-CM | POA: Diagnosis not present

## 2021-09-04 DIAGNOSIS — M4802 Spinal stenosis, cervical region: Secondary | ICD-10-CM | POA: Diagnosis not present

## 2021-09-04 DIAGNOSIS — Z880 Allergy status to penicillin: Secondary | ICD-10-CM

## 2021-09-04 DIAGNOSIS — J9601 Acute respiratory failure with hypoxia: Secondary | ICD-10-CM | POA: Diagnosis not present

## 2021-09-04 DIAGNOSIS — J690 Pneumonitis due to inhalation of food and vomit: Secondary | ICD-10-CM | POA: Diagnosis not present

## 2021-09-04 DIAGNOSIS — G9341 Metabolic encephalopathy: Secondary | ICD-10-CM | POA: Diagnosis not present

## 2021-09-04 DIAGNOSIS — Z781 Physical restraint status: Secondary | ICD-10-CM

## 2021-09-04 DIAGNOSIS — Z8673 Personal history of transient ischemic attack (TIA), and cerebral infarction without residual deficits: Secondary | ICD-10-CM

## 2021-09-04 DIAGNOSIS — K8000 Calculus of gallbladder with acute cholecystitis without obstruction: Secondary | ICD-10-CM | POA: Diagnosis not present

## 2021-09-04 DIAGNOSIS — J90 Pleural effusion, not elsewhere classified: Secondary | ICD-10-CM | POA: Diagnosis not present

## 2021-09-04 DIAGNOSIS — E440 Moderate protein-calorie malnutrition: Secondary | ICD-10-CM | POA: Diagnosis not present

## 2021-09-04 DIAGNOSIS — T185XXA Foreign body in anus and rectum, initial encounter: Secondary | ICD-10-CM | POA: Diagnosis not present

## 2021-09-04 DIAGNOSIS — Z888 Allergy status to other drugs, medicaments and biological substances status: Secondary | ICD-10-CM

## 2021-09-04 DIAGNOSIS — R578 Other shock: Secondary | ICD-10-CM | POA: Diagnosis not present

## 2021-09-04 DIAGNOSIS — Z794 Long term (current) use of insulin: Secondary | ICD-10-CM

## 2021-09-04 DIAGNOSIS — Z8719 Personal history of other diseases of the digestive system: Secondary | ICD-10-CM | POA: Diagnosis not present

## 2021-09-04 DIAGNOSIS — E8770 Fluid overload, unspecified: Secondary | ICD-10-CM | POA: Diagnosis not present

## 2021-09-04 DIAGNOSIS — K922 Gastrointestinal hemorrhage, unspecified: Secondary | ICD-10-CM | POA: Diagnosis not present

## 2021-09-04 DIAGNOSIS — K819 Cholecystitis, unspecified: Secondary | ICD-10-CM | POA: Diagnosis not present

## 2021-09-04 DIAGNOSIS — J9811 Atelectasis: Secondary | ICD-10-CM | POA: Diagnosis not present

## 2021-09-04 DIAGNOSIS — Z7982 Long term (current) use of aspirin: Secondary | ICD-10-CM

## 2021-09-04 DIAGNOSIS — I10 Essential (primary) hypertension: Secondary | ICD-10-CM | POA: Diagnosis present

## 2021-09-04 DIAGNOSIS — J9611 Chronic respiratory failure with hypoxia: Secondary | ICD-10-CM

## 2021-09-04 DIAGNOSIS — K289 Gastrojejunal ulcer, unspecified as acute or chronic, without hemorrhage or perforation: Secondary | ICD-10-CM | POA: Diagnosis not present

## 2021-09-04 DIAGNOSIS — R0600 Dyspnea, unspecified: Secondary | ICD-10-CM | POA: Diagnosis not present

## 2021-09-04 DIAGNOSIS — Y838 Other surgical procedures as the cause of abnormal reaction of the patient, or of later complication, without mention of misadventure at the time of the procedure: Secondary | ICD-10-CM | POA: Diagnosis not present

## 2021-09-04 DIAGNOSIS — G9340 Encephalopathy, unspecified: Secondary | ICD-10-CM | POA: Diagnosis not present

## 2021-09-04 DIAGNOSIS — Z720 Tobacco use: Secondary | ICD-10-CM

## 2021-09-04 DIAGNOSIS — R911 Solitary pulmonary nodule: Secondary | ICD-10-CM | POA: Diagnosis not present

## 2021-09-04 DIAGNOSIS — A498 Other bacterial infections of unspecified site: Secondary | ICD-10-CM | POA: Diagnosis not present

## 2021-09-04 DIAGNOSIS — E785 Hyperlipidemia, unspecified: Secondary | ICD-10-CM | POA: Diagnosis present

## 2021-09-04 DIAGNOSIS — M4714 Other spondylosis with myelopathy, thoracic region: Secondary | ICD-10-CM | POA: Diagnosis not present

## 2021-09-04 DIAGNOSIS — K6389 Other specified diseases of intestine: Secondary | ICD-10-CM | POA: Diagnosis not present

## 2021-09-04 DIAGNOSIS — R933 Abnormal findings on diagnostic imaging of other parts of digestive tract: Secondary | ICD-10-CM | POA: Diagnosis not present

## 2021-09-04 DIAGNOSIS — R338 Other retention of urine: Secondary | ICD-10-CM | POA: Diagnosis present

## 2021-09-04 DIAGNOSIS — M25511 Pain in right shoulder: Secondary | ICD-10-CM | POA: Diagnosis present

## 2021-09-04 DIAGNOSIS — D696 Thrombocytopenia, unspecified: Secondary | ICD-10-CM | POA: Diagnosis not present

## 2021-09-04 DIAGNOSIS — R1310 Dysphagia, unspecified: Secondary | ICD-10-CM | POA: Diagnosis not present

## 2021-09-04 DIAGNOSIS — G9349 Other encephalopathy: Secondary | ICD-10-CM | POA: Diagnosis present

## 2021-09-04 DIAGNOSIS — K9161 Intraoperative hemorrhage and hematoma of a digestive system organ or structure complicating a digestive sytem procedure: Secondary | ICD-10-CM | POA: Diagnosis not present

## 2021-09-04 DIAGNOSIS — K2971 Gastritis, unspecified, with bleeding: Secondary | ICD-10-CM | POA: Diagnosis present

## 2021-09-04 DIAGNOSIS — R6521 Severe sepsis with septic shock: Secondary | ICD-10-CM | POA: Diagnosis not present

## 2021-09-04 DIAGNOSIS — R579 Shock, unspecified: Secondary | ICD-10-CM

## 2021-09-04 DIAGNOSIS — E119 Type 2 diabetes mellitus without complications: Secondary | ICD-10-CM | POA: Diagnosis present

## 2021-09-04 DIAGNOSIS — K219 Gastro-esophageal reflux disease without esophagitis: Secondary | ICD-10-CM | POA: Diagnosis present

## 2021-09-04 DIAGNOSIS — K811 Chronic cholecystitis: Secondary | ICD-10-CM | POA: Diagnosis not present

## 2021-09-04 DIAGNOSIS — M25512 Pain in left shoulder: Secondary | ICD-10-CM | POA: Diagnosis present

## 2021-09-04 DIAGNOSIS — I951 Orthostatic hypotension: Secondary | ICD-10-CM | POA: Diagnosis not present

## 2021-09-04 DIAGNOSIS — T83031A Leakage of indwelling urethral catheter, initial encounter: Secondary | ICD-10-CM | POA: Diagnosis not present

## 2021-09-04 DIAGNOSIS — E1165 Type 2 diabetes mellitus with hyperglycemia: Secondary | ICD-10-CM | POA: Diagnosis not present

## 2021-09-04 DIAGNOSIS — I639 Cerebral infarction, unspecified: Secondary | ICD-10-CM | POA: Diagnosis not present

## 2021-09-04 DIAGNOSIS — K5900 Constipation, unspecified: Secondary | ICD-10-CM | POA: Diagnosis not present

## 2021-09-04 DIAGNOSIS — Z79899 Other long term (current) drug therapy: Secondary | ICD-10-CM

## 2021-09-04 DIAGNOSIS — R6520 Severe sepsis without septic shock: Secondary | ICD-10-CM | POA: Diagnosis not present

## 2021-09-04 DIAGNOSIS — Z452 Encounter for adjustment and management of vascular access device: Secondary | ICD-10-CM | POA: Diagnosis not present

## 2021-09-04 DIAGNOSIS — K5710 Diverticulosis of small intestine without perforation or abscess without bleeding: Secondary | ICD-10-CM | POA: Diagnosis not present

## 2021-09-04 DIAGNOSIS — R836 Abnormal cytological findings in cerebrospinal fluid: Secondary | ICD-10-CM | POA: Diagnosis not present

## 2021-09-04 DIAGNOSIS — G35 Multiple sclerosis: Secondary | ICD-10-CM | POA: Diagnosis not present

## 2021-09-04 DIAGNOSIS — R918 Other nonspecific abnormal finding of lung field: Secondary | ICD-10-CM | POA: Diagnosis not present

## 2021-09-04 DIAGNOSIS — R4189 Other symptoms and signs involving cognitive functions and awareness: Secondary | ICD-10-CM | POA: Diagnosis present

## 2021-09-04 DIAGNOSIS — G0400 Acute disseminated encephalitis and encephalomyelitis, unspecified: Secondary | ICD-10-CM | POA: Diagnosis not present

## 2021-09-04 DIAGNOSIS — A419 Sepsis, unspecified organism: Secondary | ICD-10-CM | POA: Diagnosis not present

## 2021-09-04 DIAGNOSIS — B9620 Unspecified Escherichia coli [E. coli] as the cause of diseases classified elsewhere: Secondary | ICD-10-CM | POA: Diagnosis not present

## 2021-09-04 DIAGNOSIS — I6523 Occlusion and stenosis of bilateral carotid arteries: Secondary | ICD-10-CM | POA: Diagnosis not present

## 2021-09-04 DIAGNOSIS — E1110 Type 2 diabetes mellitus with ketoacidosis without coma: Secondary | ICD-10-CM | POA: Diagnosis not present

## 2021-09-04 DIAGNOSIS — Z4682 Encounter for fitting and adjustment of non-vascular catheter: Secondary | ICD-10-CM | POA: Diagnosis not present

## 2021-09-04 DIAGNOSIS — Z7902 Long term (current) use of antithrombotics/antiplatelets: Secondary | ICD-10-CM

## 2021-09-04 DIAGNOSIS — E860 Dehydration: Secondary | ICD-10-CM | POA: Diagnosis present

## 2021-09-04 DIAGNOSIS — E041 Nontoxic single thyroid nodule: Secondary | ICD-10-CM | POA: Diagnosis not present

## 2021-09-04 DIAGNOSIS — R9082 White matter disease, unspecified: Secondary | ICD-10-CM | POA: Diagnosis not present

## 2021-09-04 DIAGNOSIS — L89152 Pressure ulcer of sacral region, stage 2: Secondary | ICD-10-CM | POA: Diagnosis not present

## 2021-09-04 DIAGNOSIS — L8990 Pressure ulcer of unspecified site, unspecified stage: Secondary | ICD-10-CM | POA: Insufficient documentation

## 2021-09-04 DIAGNOSIS — G969 Disorder of central nervous system, unspecified: Secondary | ICD-10-CM | POA: Diagnosis not present

## 2021-09-04 DIAGNOSIS — M5136 Other intervertebral disc degeneration, lumbar region: Secondary | ICD-10-CM | POA: Diagnosis not present

## 2021-09-04 DIAGNOSIS — Z7984 Long term (current) use of oral hypoglycemic drugs: Secondary | ICD-10-CM

## 2021-09-04 DIAGNOSIS — K625 Hemorrhage of anus and rectum: Secondary | ICD-10-CM | POA: Diagnosis not present

## 2021-09-04 DIAGNOSIS — K8020 Calculus of gallbladder without cholecystitis without obstruction: Secondary | ICD-10-CM | POA: Diagnosis not present

## 2021-09-04 DIAGNOSIS — K51911 Ulcerative colitis, unspecified with rectal bleeding: Secondary | ICD-10-CM | POA: Diagnosis not present

## 2021-09-04 DIAGNOSIS — Z8249 Family history of ischemic heart disease and other diseases of the circulatory system: Secondary | ICD-10-CM

## 2021-09-04 DIAGNOSIS — N170 Acute kidney failure with tubular necrosis: Secondary | ICD-10-CM | POA: Diagnosis not present

## 2021-09-04 DIAGNOSIS — M50022 Cervical disc disorder at C5-C6 level with myelopathy: Secondary | ICD-10-CM | POA: Diagnosis not present

## 2021-09-04 HISTORY — DX: Cerebral infarction, unspecified: I63.9

## 2021-09-04 LAB — COMPREHENSIVE METABOLIC PANEL
ALT: 17 U/L (ref 0–44)
AST: 15 U/L (ref 15–41)
Albumin: 3.7 g/dL (ref 3.5–5.0)
Alkaline Phosphatase: 35 U/L — ABNORMAL LOW (ref 38–126)
Anion gap: 7 (ref 5–15)
BUN: 28 mg/dL — ABNORMAL HIGH (ref 6–20)
CO2: 24 mmol/L (ref 22–32)
Calcium: 8.7 mg/dL — ABNORMAL LOW (ref 8.9–10.3)
Chloride: 106 mmol/L (ref 98–111)
Creatinine, Ser: 1.23 mg/dL (ref 0.61–1.24)
GFR, Estimated: >60 mL/min (ref >60)
Glucose, Bld: 193 mg/dL — ABNORMAL HIGH (ref 70–99)
Potassium: 4.9 mmol/L (ref 3.5–5.1)
Sodium: 137 mmol/L (ref 135–145)
Total Bilirubin: 0.8 mg/dL (ref 0.3–1.2)
Total Protein: 6.9 g/dL (ref 6.5–8.1)

## 2021-09-04 LAB — CBC
HCT: 37 % — ABNORMAL LOW (ref 39.0–52.0)
Hemoglobin: 12.2 g/dL — ABNORMAL LOW (ref 13.0–17.0)
MCH: 29 pg (ref 26.0–34.0)
MCHC: 33 g/dL (ref 30.0–36.0)
MCV: 87.9 fL (ref 80.0–100.0)
Platelets: 209 10*3/uL (ref 150–400)
RBC: 4.21 MIL/uL — ABNORMAL LOW (ref 4.22–5.81)
RDW: 12.2 % (ref 11.5–15.5)
WBC: 11.8 10*3/uL — ABNORMAL HIGH (ref 4.0–10.5)
nRBC: 0 % (ref 0.0–0.2)

## 2021-09-04 LAB — GLUCOSE, CAPILLARY
Glucose-Capillary: 125 mg/dL — ABNORMAL HIGH (ref 70–99)
Glucose-Capillary: 134 mg/dL — ABNORMAL HIGH (ref 70–99)

## 2021-09-04 LAB — POC OCCULT BLOOD, ED: Fecal Occult Bld: POSITIVE — AB

## 2021-09-04 MED ORDER — ACETAMINOPHEN 650 MG RE SUPP: 650.0000 mg | Freq: Four times a day (QID) | RECTAL | Status: DC | PRN

## 2021-09-04 MED ORDER — ONDANSETRON HCL 4 MG/2ML IJ SOLN
4.0000 mg | Freq: Four times a day (QID) | INTRAMUSCULAR | Status: DC | PRN
  Administered 2021-10-21 – 2021-10-26 (×2): 4 mg via INTRAVENOUS
  Filled 2021-09-04 (×2): qty 2

## 2021-09-04 MED ORDER — INSULIN ASPART 100 UNIT/ML IJ SOLN
0.0000 [IU] | Freq: Three times a day (TID) | INTRAMUSCULAR | Status: DC
  Administered 2021-09-05 (×2): 3 [IU] via SUBCUTANEOUS
  Administered 2021-09-05 – 2021-09-06 (×2): 5 [IU] via SUBCUTANEOUS

## 2021-09-04 MED ORDER — PANTOPRAZOLE SODIUM 40 MG IV SOLR
40.0000 mg | INTRAVENOUS | Status: DC
  Administered 2021-09-04 – 2021-09-05 (×2): 40 mg via INTRAVENOUS
  Filled 2021-09-04 (×2): qty 10

## 2021-09-04 MED ORDER — INSULIN GLARGINE-YFGN 100 UNIT/ML ~~LOC~~ SOLN
10.0000 [IU] | Freq: Two times a day (BID) | SUBCUTANEOUS | Status: DC
  Administered 2021-09-04 – 2021-09-05 (×3): 10 [IU] via SUBCUTANEOUS
  Filled 2021-09-04 (×7): qty 0.1

## 2021-09-04 MED ORDER — SODIUM CHLORIDE 0.9 % IV SOLN: INTRAVENOUS | Status: DC

## 2021-09-04 MED ORDER — ACETAMINOPHEN 325 MG PO TABS
650.0000 mg | ORAL_TABLET | Freq: Four times a day (QID) | ORAL | Status: DC | PRN
  Administered 2021-09-05 – 2021-09-07 (×3): 650 mg via ORAL
  Filled 2021-09-04 (×4): qty 2

## 2021-09-04 MED ORDER — SODIUM CHLORIDE 0.9 % IV BOLUS
500.0000 mL | Freq: Once | INTRAVENOUS | Status: AC
  Administered 2021-09-04: 500 mL via INTRAVENOUS

## 2021-09-04 MED ORDER — ONDANSETRON HCL 4 MG PO TABS: 4.0000 mg | ORAL_TABLET | Freq: Four times a day (QID) | ORAL | Status: DC | PRN

## 2021-09-04 MED ORDER — ROSUVASTATIN CALCIUM 20 MG PO TABS
40.0000 mg | ORAL_TABLET | Freq: Every day | ORAL | Status: DC
  Administered 2021-09-05: 40 mg via ORAL
  Filled 2021-09-04: qty 2

## 2021-09-04 MED ORDER — INSULIN ASPART 100 UNIT/ML IJ SOLN
0.0000 [IU] | Freq: Every day | INTRAMUSCULAR | Status: DC
  Administered 2021-09-05: 2 [IU] via SUBCUTANEOUS

## 2021-09-04 MED ORDER — PANTOPRAZOLE SODIUM 40 MG IV SOLR
40.0000 mg | Freq: Once | INTRAVENOUS | Status: AC
  Administered 2021-09-04: 40 mg via INTRAVENOUS
  Filled 2021-09-04: qty 10

## 2021-09-04 MED ORDER — TAMSULOSIN HCL 0.4 MG PO CAPS
0.4000 mg | ORAL_CAPSULE | Freq: Every day | ORAL | Status: DC
  Administered 2021-09-04 – 2021-09-05 (×2): 0.4 mg via ORAL
  Filled 2021-09-04 (×2): qty 1

## 2021-09-04 MED ORDER — SODIUM CHLORIDE 0.9 % IV BOLUS
1000.0000 mL | Freq: Once | INTRAVENOUS | Status: AC
  Administered 2021-09-04: 1000 mL via INTRAVENOUS

## 2021-09-04 MED ORDER — TRAZODONE HCL 50 MG PO TABS
150.0000 mg | ORAL_TABLET | Freq: Every day | ORAL | Status: DC
  Administered 2021-09-04 – 2021-09-05 (×2): 150 mg via ORAL
  Filled 2021-09-04 (×2): qty 3

## 2021-09-04 NOTE — Progress Notes (Signed)
   09/04/21 2213  Assess: MEWS Score  BP (!) 73/52  MAP (mmHg) (!) 60  Pulse Rate 91  SpO2 97 %  Assess: MEWS Score  MEWS Temp 0  MEWS Systolic 2  MEWS Pulse 0  MEWS RR 0  MEWS LOC 0  MEWS Score 2  MEWS Score Color Yellow  Assess: if the MEWS score is Yellow or Red  Were vital signs taken at a resting state? Yes  Focused Assessment Change from prior assessment (see assessment flowsheet)  Notify: Charge Nurse/RN  Name of Charge Nurse/RN Notified Cala Bradford, RN  Date Charge Nurse/RN Notified 09/04/21  Time Charge Nurse/RN Notified 2213  Notify: Provider  Provider Name/Title Carren Rang, MD  Date Provider Notified 09/04/21  Time Provider Notified 2213  Method of Notification  (Secure chat)  Notification Reason Change in status  Provider response See new orders  Date of Provider Response 09/04/21  Time of Provider Response 2220  Assess: SIRS CRITERIA  SIRS Temperature  0  SIRS Pulse 1  SIRS Respirations  0  SIRS WBC 1  SIRS Score Sum  2    Nurse was helping pt. to bathroom. He stated that he felt dizzy and started stumbling like he was about to pass out. Nurse was able to get him sitting back on the bed. Pt. Diaphoretic. Manual BP was 74/44. HR 91. RN notified. MD notified with new orders for bolus and orthostatic vitals after bolus is complete.

## 2021-09-04 NOTE — H&P (Signed)
History and Physical    Troy Randall BPZ:[id number] DOB: [birth year] DOA: 09/04/2021  PCP: Benita Stabile, MD  Patient coming from: Home  I have personally briefly reviewed patient's old medical records in [facility]  Chief Complaint: Dark stools  HPI: Troy Randall is a 61 y.o. male with medical history significant of hypertension, hyperlipidemia, diabetes, prior stroke on aspirin and Plavix, who was in his usual state of health when this morning he developed frequent dark stools.  Denies any bright red blood per rectum.  No nausea or vomiting.  No abdominal pain.  He was feeling dizzy and lightheaded on standing.  Felt that "his breathing was not right".  Denied any chest pain.  Reports having 6-7 dark bowel movements today.  1 of those bowel movements with after he arrived to the hospital.  No history of NSAID use.  ED Course: Tested positive for FOBT.  Hemoglobin is mildly low from baseline at 12.2.  BUN mildly elevated 28  Review of Systems: As per HPI otherwise 10 point review of systems negative.    Past Medical History:  Diagnosis Date   Anemia    Arthritis    CVA (cerebral vascular accident) (HCC)    Diabetes mellitus without complication (HCC)    Hyperlipidemia    Hypertension    Kidney stones     Past Surgical History:  Procedure Laterality Date   ANKLE SURGERY     HERNIA REPAIR     umbilical x1 Incisional x1   INCISIONAL HERNIA REPAIR  04/13/2011   Procedure: LAPAROSCOPIC INCISIONAL HERNIA;  Surgeon: Dalia Heading, MD;  Location: [facility];  Service: General;  Laterality: N/A;  Recurrent Laparoscopic Incisional Herniorraphy with Mesh   KIDNEY STONE SURGERY      Social History:  reports that he has never smoked. His smokeless tobacco use includes snuff. He reports that he does not drink alcohol and does not use drugs.  Allergies  Allergen Reactions   Metformin And Related     GI upset   Penicillins Hives    Family History  Problem Relation Age of Onset    Heart failure Mother    Diabetes Father    Cancer Other    Heart attack Other    Anesthesia problems Neg Hx    Hypotension Neg Hx    Malignant hyperthermia Neg Hx    Pseudochol deficiency Neg Hx    Colon cancer Neg Hx    Inflammatory bowel disease Neg Hx     Prior to Admission medications   Medication Sig Start Date End Date Taking? Authorizing Provider  amLODipine (NORVASC) 10 MG tablet Take 1 tablet (10 mg total) by mouth daily. 06/15/17  Yes Aliene Beams, MD  aspirin EC 81 MG tablet Take 81 mg by mouth daily. Swallow whole.   Yes [provider]  clopidogrel (PLAVIX) 75 MG tablet Take 1 tablet (75 mg total) by mouth daily. 04/30/21  Yes Azucena Fallen, MD  glipiZIDE (GLUCOTROL XL) 10 MG 24 hr tablet Take 1 tablet (10 mg total) by mouth daily with breakfast. 04/16/17  Yes Hagler, Fleet Contras, MD  levocetirizine (XYZAL) 5 MG tablet Take 1 tablet by mouth at bedtime.   Yes [provider]  lisinopril (PRINIVIL,ZESTRIL) 40 MG tablet Take 1 tablet (40 mg total) by mouth daily. 05/12/17  Yes Aliene Beams, MD  metFORMIN (GLUCOPHAGE-XR) 500 MG 24 hr tablet Take 500-1,000 mg by mouth in the morning and at bedtime. 02/09/21  Yes [provider]  rosuvastatin (CRESTOR) 40 MG tablet Take 1 tablet (40 mg total) by mouth daily. 04/29/21  Yes Azucena Fallen, MD  tamsulosin (FLOMAX) 0.4 MG CAPS capsule Take 0.4 mg by mouth daily. 02/12/21  Yes [provider]  traZODone (DESYREL) 150 MG tablet Take 150 mg by mouth at bedtime. 02/04/21  Yes [provider]  TRESIBA FLEXTOUCH 200 UNIT/ML FlexTouch Pen Inject 20 Units into the skin in the morning and at bedtime. 03/14/21  Yes [provider]  BD VEO INSULIN SYRINGE U/F 31G X 15/64" 1 ML MISC  USE AS DIRECTED 06/08/17   Aliene Beams, MD  glucose blood (ONETOUCH VERIO) test strip TEST twice a day 05/04/17   Aliene Beams, MD  Insulin Pen Needle (NOVOTWIST) 32G X 5 MM MISC Use two daily to inject  Victoza and Toujeo. 04/25/15   Reather Littler, MD  [facility] DELICA LANCETS 33G MISC Use to check blood sugar once a day dx code E11.65 11/21/14   Reather Littler, MD    Physical Exam: Vitals:   09/04/21 1330 09/04/21 1400 09/04/21 1610 09/04/21 1702  BP: 93/65 125/74 111/62 (!) 140/65  Pulse: 72 81 68 73  Resp:  18 16 17   Temp:   97.7 F (36.5 C) 97.6 F (36.4 C)  TempSrc:   Oral Oral  SpO2: 94% 100% 97% 99%  Weight:      Height:        Constitutional: NAD, calm, comfortable Eyes: PERRL, lids and conjunctivae normal ENMT: Mucous membranes are moist. Posterior pharynx clear of any exudate or lesions.Normal dentition.  Neck: normal, supple, no masses, no thyromegaly Respiratory: clear to auscultation bilaterally, no wheezing, no crackles. Normal respiratory effort. No accessory muscle use.  Cardiovascular: Regular rate and rhythm, no murmurs / rubs / gallops. No extremity edema. 2+ pedal pulses. No carotid bruits.  Abdomen: no tenderness, no masses palpated. No hepatosplenomegaly. Bowel sounds positive.  Musculoskeletal: no clubbing / cyanosis. No joint deformity upper and lower extremities. Good ROM, no contractures. Normal muscle tone.  Skin: no rashes, lesions, ulcers. No induration Neurologic: CN 2-12 grossly intact. Sensation intact, DTR normal. Strength 5/5 in all 4.  Psychiatric: Normal judgment and insight. Alert and oriented x 3. Normal mood.    Labs on Admission: I have personally reviewed following labs and imaging studies  CBC: Recent Labs  Lab 09/04/21 1238  WBC 11.8*  HGB 12.2*  HCT 37.0*  MCV 87.9  PLT 209   Basic Metabolic Panel: Recent Labs  Lab 09/04/21 1238  NA 137  K 4.9  CL 106  CO2 24  GLUCOSE 193*  BUN 28*  CREATININE 1.23  CALCIUM 8.7*   GFR: Estimated Creatinine Clearance: 94.7 mL/min (by C-G formula based on SCr of 1.23 mg/dL). Liver Function Tests: Recent Labs  Lab 09/04/21 1238  AST 15  ALT 17  ALKPHOS 35*  BILITOT 0.8  PROT 6.9   ALBUMIN 3.7   No results for input(s): "LIPASE", "AMYLASE" in the last 168 hours. No results for input(s): "AMMONIA" in the last 168 hours. Coagulation Profile: No results for input(s): "INR", "PROTIME" in the last 168 hours. Cardiac Enzymes: No results for input(s): "CKTOTAL", "CKMB", "CKMBINDEX", "TROPONINI" in the last 168 hours. BNP (last 3 results) No results for input(s): "PROBNP" in the last 8760 hours. HbA1C: No results for input(s): "HGBA1C" in the last 72 hours. CBG: No results for input(s): "GLUCAP" in the last 168 hours. Lipid Profile: No results for input(s): "CHOL", "HDL", "LDLCALC", "TRIG", "CHOLHDL", "LDLDIRECT"  in the last 72 hours. Thyroid Function Tests: No results for input(s): "TSH", "T4TOTAL", "FREET4", "T3FREE", "THYROIDAB" in the last 72 hours. Anemia Panel: No results for input(s): "VITAMINB12", "FOLATE", "FERRITIN", "TIBC", "IRON", "RETICCTPCT" in the last 72 hours. Urine analysis:    Component Value Date/Time   COLORURINE STRAW (A) 04/28/2021 1735   APPEARANCEUR CLEAR 04/28/2021 1735   LABSPEC 1.016 04/28/2021 1735   PHURINE 5.0 04/28/2021 1735   GLUCOSEU 50 (A) 04/28/2021 1735   GLUCOSEU >=1000 (A) 12/12/2014 1555   HGBUR NEGATIVE 04/28/2021 1735   BILIRUBINUR NEGATIVE 04/28/2021 1735   KETONESUR NEGATIVE 04/28/2021 1735   PROTEINUR 30 (A) 04/28/2021 1735   UROBILINOGEN 0.2 12/12/2014 1555   NITRITE NEGATIVE 04/28/2021 1735   LEUKOCYTESUR NEGATIVE 04/28/2021 1735    Radiological Exams on Admission: No results found.  EKG: Independently reviewed.  Sinus rhythm without acute ischemic changes  Assessment/Plan Principal Problem:   GI bleed Active Problems:   Uncontrolled type 2 diabetes mellitus with hyperglycemia, without long-term current use of insulin (HCC)   Essential hypertension   Hyperlipidemia     GI bleeding -Source of bleeding is not entirely clear -Reported to have dark red stools without bright red blood -BUN also noted  mildly elevated -He did not have any abdominal pain -No history of NSAIDs -GI following -We will start on Protonix for now -Clear liquid diet, n.p.o. after midnight  Prior history of stroke -Has been on aspirin and Plavix since 04/2021 -Neurology note during that admission had suggested dual antiplatelet therapy for 21 days followed by monotherapy with aspirin -No prior history of cardiac disease that patient is aware of -We will discontinue Plavix -Resume aspirin when felt reasonable per GI  Diabetes -Hold metformin and glipizide -Reduce home dose of basal insulin by 50% -Follow on sliding scale  Hypertension -Holding antihypertensives for now since he was orthostatic -Resume as blood pressure will allow  Hyperlipidemia -Continue statin  BPH -Continue tamsulosin  Obesity, class III -BMI 41.6 -Needs lifestyle changes  DVT prophylaxis: SCDs Code Status: Full code Family Communication: Discussed with wife at the bedside Disposition Plan: Discharge home once bleeding issues have stabilized Consults called: Gastroenterology Admission status: Inpatient, telemetry  Erick Blinks MD [facility]   If 7PM-7AM, please contact night-coverage [email or web address]   09/04/2021, 8:17 PM

## 2021-09-04 NOTE — ED Provider Notes (Signed)
[facility] EMERGENCY DEPARTMENT Provider Note   CSN: [id number] Arrival date & time: 09/04/21  1207     History  Chief Complaint  Patient presents with   Rectal Bleeding    Troy Randall is a 61 y.o. male with past medical history significant for diabetes, obesity, hypertension, hyperlipidemia, stroke who is currently on Plavix, aspirin presents with bright red blood per rectum since this morning with bowel movements, with high-volume diarrhea.  Patient reports that last weekend he had eaten at [employer] and had some abdominal cramping followed by diarrhea on Monday and Tuesday but had relief on Wednesday.  Patient reports that he began having these episodes this morning, has had 6 episodes since.  On my exam he is not having any significant abdominal pain.  He denies fever, chills.  He reports that he does feel lightheaded when he stands up.  Denies any dysuria, frequency, history of melena.  Unremarkable colonoscopy 02/25/2021.  Rectal Bleeding      Home Medications Prior to Admission medications   Medication Sig Start Date End Date Taking? Authorizing Provider  amLODipine (NORVASC) 10 MG tablet Take 1 tablet (10 mg total) by mouth daily. 06/15/17  Yes Aliene Beams, MD  aspirin EC 81 MG tablet Take 81 mg by mouth daily. Swallow whole.   Yes [provider]  clopidogrel (PLAVIX) 75 MG tablet Take 1 tablet (75 mg total) by mouth daily. 04/30/21  Yes Azucena Fallen, MD  glipiZIDE (GLUCOTROL XL) 10 MG 24 hr tablet Take 1 tablet (10 mg total) by mouth daily with breakfast. 04/16/17  Yes Hagler, Fleet Contras, MD  levocetirizine (XYZAL) 5 MG tablet Take 1 tablet by mouth at bedtime.   Yes [provider]  lisinopril (PRINIVIL,ZESTRIL) 40 MG tablet Take 1 tablet (40 mg total) by mouth daily. 05/12/17  Yes Aliene Beams, MD  metFORMIN (GLUCOPHAGE-XR) 500 MG 24 hr tablet Take 500-1,000 mg by mouth in the morning and at bedtime. 02/09/21  Yes [provider]   rosuvastatin (CRESTOR) 40 MG tablet Take 1 tablet (40 mg total) by mouth daily. 04/29/21  Yes Azucena Fallen, MD  tamsulosin (FLOMAX) 0.4 MG CAPS capsule Take 0.4 mg by mouth daily. 02/12/21  Yes [provider]  traZODone (DESYREL) 150 MG tablet Take 150 mg by mouth at bedtime. 02/04/21  Yes [provider]  TRESIBA FLEXTOUCH 200 UNIT/ML FlexTouch Pen Inject 20 Units into the skin in the morning and at bedtime. 03/14/21  Yes [provider]  BD VEO INSULIN SYRINGE U/F 31G X 15/64" 1 ML MISC  USE AS DIRECTED 06/08/17   Aliene Beams, MD  glucose blood (ONETOUCH VERIO) test strip TEST twice a day 05/04/17   Aliene Beams, MD  Insulin Pen Needle (NOVOTWIST) 32G X 5 MM MISC Use two daily to inject Victoza and Toujeo. 04/25/15   Reather Littler, MD  [facility] DELICA LANCETS 33G MISC Use to check blood sugar once a day dx code E11.65 11/21/14   Reather Littler, MD      Allergies    Metformin and related and Penicillins    Review of Systems   Review of Systems  Gastrointestinal:  Positive for hematochezia.  All other systems reviewed and are negative.   Physical Exam Updated Vital Signs BP 125/74   Pulse 81   Temp 97.8 F (36.6 C) (Oral)   Resp 18   Ht 6' (1.829 m)   Wt (!) 139.3 kg   SpO2 100%   BMI 41.64 kg/m  Physical  Exam Vitals and nursing note reviewed.  Constitutional:      General: He is not in acute distress.    Appearance: Normal appearance. He is obese.  HENT:     Head: Normocephalic and atraumatic.  Eyes:     General:        Right eye: No discharge.        Left eye: No discharge.  Cardiovascular:     Rate and Rhythm: Normal rate and regular rhythm.     Heart sounds: No murmur heard.    No friction rub. No gallop.  Pulmonary:     Effort: Pulmonary effort is normal.     Breath sounds: Normal breath sounds.  Abdominal:     General: Bowel sounds are normal.     Palpations: Abdomen is soft.     Comments: No significant tenderness palpation of  the abdomen, no rebound, rigidity, guarding throughout.  Patient reports minimal discomfort with deep palpation but no pain at rest.  Genitourinary:    Comments: External rectum within normal limits.  Patient had a small amount of dark stool with dark red venous blood on rectal exam.  He is not actively bleeding from the rectum on my exam. Skin:    General: Skin is warm and dry.     Capillary Refill: Capillary refill takes less than 2 seconds.  Neurological:     Mental Status: He is alert and oriented to person, place, and time.     Comments: Patient endorses some lightheadedness on standing.  Otherwise, Cranial nerves II through XII grossly intact.  Intact finger-nose, intact heel-to-shin.  Romberg negative, gait normal.  Alert and oriented x3.  Moves all 4 limbs spontaneously, normal coordination.  No pronator drift.  Intact strength 5 out of 5 bilateral upper and lower extremities.    Psychiatric:        Mood and Affect: Mood normal.        Behavior: Behavior normal.     ED Results / Procedures / Treatments   Labs (all labs ordered are listed, but only abnormal results are displayed) Labs Reviewed  COMPREHENSIVE METABOLIC PANEL - Abnormal; Notable for the following components:      Result Value   Glucose, Bld 193 (*)    BUN 28 (*)    Calcium 8.7 (*)    Alkaline Phosphatase 35 (*)    All other components within normal limits  CBC - Abnormal; Notable for the following components:   WBC 11.8 (*)    RBC 4.21 (*)    Hemoglobin 12.2 (*)    HCT 37.0 (*)    All other components within normal limits  POC OCCULT BLOOD, ED - Abnormal; Notable for the following components:   Fecal Occult Bld POSITIVE (*)    All other components within normal limits  TYPE AND SCREEN    EKG EKG Interpretation  Date/Time:  Thursday September 04 2021 14:12:28 EDT Ventricular Rate:  74 PR Interval:  194 QRS Duration: 85 QT Interval:  386 QTC Calculation: 429 R Axis:   -4 Text Interpretation: Sinus  rhythm Confirmed by Alvester Chou [phone]) on 09/04/2021 2:31:56 PM  Radiology No results found.  Procedures Procedures    Medications Ordered in ED Medications  0.9 %  sodium chloride infusion (has no administration in time range)  pantoprazole (PROTONIX) injection 40 mg (40 mg Intravenous Given 09/04/21 1449)  sodium chloride 0.9 % bolus 1,000 mL (1,000 mLs Intravenous New Bag/Given 09/04/21 1450)    ED Course/ Medical  Decision Making/ A&P Clinical Course as of 09/04/21 1508  Thu Sep 04, 2021  324 61 year old male on aspirin and Plavix presented ED with blood in his stool, began earlier today, reports significant amount of blood in the toilet bowl after bowel movements today.  Blood counts show an approximate 1 g drop in hemoglobin from 4 months ago and he was most recently tested.  He is fecal occult positive.  He otherwise has a benign abdominal exam.  Given the 1 g drop in hemoglobin, the case was discussed with the on-call gastroenterologist by the PA provider and recommended for observation admission for GI bleed.  Patient and his wife are updated in agreement.  Clear liquid diet for today okay per GI [MT]    Clinical Course User Index [MT] Trifan, Kermit Balo, MD                           Medical Decision Making Amount and/or Complexity of Data Reviewed Labs: ordered.  Risk Prescription drug management. Decision regarding hospitalization.   This patient is a 61 y.o. male who presents to the ED for concern of rectal bleeding, output stool without abdominal pain, this involves an extensive number of treatment options, and is a complaint that carries with it a high risk of complications and morbidity. The emergent differential diagnosis prior to evaluation includes, but is not limited to, active GI bleed, either upper or lower in nature, diverticular disease, hemorrhoids, rectal fissure, infectious diarrhea, Symptomatic anemia from blood loss versus other.   This is not an  exhaustive differential.   Past Medical History / Co-morbidities / Social History: Previous history of poorly controlled type 2 diabetes, back pain, hypertension, hyperlipidemia, previous stroke  Additional history: Chart reviewed. Pertinent results include: Reviewed outpatient PCP visits, PCP had quoted a evaluation of previous colonoscopy from February of this year but I cannot find colonoscopy report in patient's file.  Patient had discussed the colonoscopy looked overall unremarkable, did not comment on the presence or absence of diverticular disease.  Physical Exam: Physical exam performed. The pertinent findings include: Patient is obese, does have some dizziness or lightheadedness on standing, he was mildly hypotensive on arrival with systolic in the 90s.  His rectal exam revealed normal appearing external rectum, no obvious or palpated internal or external hemorrhoids, there is some dark brown stool with dark red blood noted on rectal exam.  No active bleeding from rectum.  Lab Tests: I ordered, and personally interpreted labs.  The pertinent results include: CMP is overall unremarkable although he does have elevated glucose at 193 consistent with his poorly controlled diabetes.  BUN elevated 28, creatinine elevated at 1.23 from baseline around 0.7, seems compatible with mild generalized dehydration which could be contributing to his dizziness with standing versus poor fluid intake versus excess fluid loss.  CBC notable for a 1.3 g drop in hemoglobin since evaluation 4 months ago for discharge.  Leukocytosis with blood cells 11.8 likely secondary to stress.  Hemoccult positive.   Imaging Studies: Consider imaging given new GI bleed with no active cause, however as patient is not having significant abdominal pain will defer this study to GI recommendations or during patient's hospital stay.  Cardiac Monitoring:  The patient was maintained on a cardiac monitor.  My attending physician Dr.  Renaye Rakers viewed and interpreted the cardiac monitored which showed an underlying rhythm of: NSR. I agree with this interpretation.   Medications: I ordered medication including Protonix,  fluid bolus for new GI bleed, signs of dehydration.  Patient will require further evaluation to his response to treatment while he is in the hospital.   Consultations Obtained: I requested consultation with the GI doctor, spoke with Dr. Kendell Bane, As well as the hospitalist and spoke with Dr. Kerry Hough,  and discussed lab and imaging findings as well as pertinent plan - they recommend: Admit for GI bleed, begin Protonix, clear liquid diet at this time.   Disposition: After consideration of the diagnostic results and the patients response to treatment, I feel that patient would benefit from admission as discussed above.   I discussed this case with my attending physician Dr. Renaye Rakers who cosigned this note including patient's presenting symptoms, physical exam, and planned diagnostics and interventions. Attending physician stated agreement with plan or made changes to plan which were implemented.   Attending physician assessed patient at bedside.  Final Clinical Impression(s) / ED Diagnoses Final diagnoses:  Lower GI bleed    Rx / DC Orders ED Discharge Orders     None         [place] 09/04/21 1508    Terald Sleeper, MD 09/04/21 1718

## 2021-09-04 NOTE — ED Triage Notes (Signed)
Pt presents to ED with complaints of rectal bleeding since this am.

## 2021-09-04 NOTE — Progress Notes (Deleted)
History and Physical    Troy Randall BPZ:[id number] DOB: [birth year] DOA: 09/04/2021  PCP: Benita Stabile, MD  Patient coming from: Home  I have personally briefly reviewed patient's old medical records in [facility]  Chief Complaint: Dark stools  HPI: Troy Randall is a 61 y.o. male with medical history significant of hypertension, hyperlipidemia, diabetes, prior stroke on aspirin and Plavix, who was in his usual state of health when this morning he developed frequent dark stools.  Denies any bright red blood per rectum.  No nausea or vomiting.  No abdominal pain.  He was feeling dizzy and lightheaded on standing.  Felt that "his breathing was not right".  Denied any chest pain.  Reports having 6-7 dark bowel movements today.  1 of those bowel movements with after he arrived to the hospital.  No history of NSAID use.  ED Course: Tested positive for FOBT.  Hemoglobin is mildly low from baseline at 12.2.  BUN mildly elevated 28  Review of Systems: As per HPI otherwise 10 point review of systems negative.    Past Medical History:  Diagnosis Date   Anemia    Arthritis    CVA (cerebral vascular accident) (HCC)    Diabetes mellitus without complication (HCC)    Hyperlipidemia    Hypertension    Kidney stones     Past Surgical History:  Procedure Laterality Date   ANKLE SURGERY     HERNIA REPAIR     umbilical x1 Incisional x1   INCISIONAL HERNIA REPAIR  04/13/2011   Procedure: LAPAROSCOPIC INCISIONAL HERNIA;  Surgeon: Dalia Heading, MD;  Location: [facility];  Service: General;  Laterality: N/A;  Recurrent Laparoscopic Incisional Herniorraphy with Mesh   KIDNEY STONE SURGERY      Social History:  reports that he has never smoked. His smokeless tobacco use includes snuff. He reports that he does not drink alcohol and does not use drugs.  Allergies  Allergen Reactions   Metformin And Related     GI upset   Penicillins Hives    Family History  Problem Relation Age of Onset    Heart failure Mother    Diabetes Father    Cancer Other    Heart attack Other    Anesthesia problems Neg Hx    Hypotension Neg Hx    Malignant hyperthermia Neg Hx    Pseudochol deficiency Neg Hx    Colon cancer Neg Hx    Inflammatory bowel disease Neg Hx     Prior to Admission medications   Medication Sig Start Date End Date Taking? Authorizing Provider  amLODipine (NORVASC) 10 MG tablet Take 1 tablet (10 mg total) by mouth daily. 06/15/17  Yes Aliene Beams, MD  aspirin EC 81 MG tablet Take 81 mg by mouth daily. Swallow whole.   Yes [provider]  clopidogrel (PLAVIX) 75 MG tablet Take 1 tablet (75 mg total) by mouth daily. 04/30/21  Yes Azucena Fallen, MD  glipiZIDE (GLUCOTROL XL) 10 MG 24 hr tablet Take 1 tablet (10 mg total) by mouth daily with breakfast. 04/16/17  Yes Hagler, Fleet Contras, MD  levocetirizine (XYZAL) 5 MG tablet Take 1 tablet by mouth at bedtime.   Yes [provider]  lisinopril (PRINIVIL,ZESTRIL) 40 MG tablet Take 1 tablet (40 mg total) by mouth daily. 05/12/17  Yes Aliene Beams, MD  metFORMIN (GLUCOPHAGE-XR) 500 MG 24 hr tablet Take 500-1,000 mg by mouth in the morning and at bedtime. 02/09/21  Yes [provider]  rosuvastatin (CRESTOR) 40 MG tablet Take 1 tablet (40 mg total) by mouth daily. 04/29/21  Yes Azucena Fallen, MD  tamsulosin (FLOMAX) 0.4 MG CAPS capsule Take 0.4 mg by mouth daily. 02/12/21  Yes [provider]  traZODone (DESYREL) 150 MG tablet Take 150 mg by mouth at bedtime. 02/04/21  Yes [provider]  TRESIBA FLEXTOUCH 200 UNIT/ML FlexTouch Pen Inject 20 Units into the skin in the morning and at bedtime. 03/14/21  Yes [provider]  BD VEO INSULIN SYRINGE U/F 31G X 15/64" 1 ML MISC  USE AS DIRECTED 06/08/17   Aliene Beams, MD  glucose blood (ONETOUCH VERIO) test strip TEST twice a day 05/04/17   Aliene Beams, MD  Insulin Pen Needle (NOVOTWIST) 32G X 5 MM MISC Use two daily to inject  Victoza and Toujeo. 04/25/15   Reather Littler, MD  [facility] DELICA LANCETS 33G MISC Use to check blood sugar once a day dx code E11.65 11/21/14   Reather Littler, MD    Physical Exam: Vitals:   09/04/21 1330 09/04/21 1400 09/04/21 1610 09/04/21 1702  BP: 93/65 125/74 111/62 (!) 140/65  Pulse: 72 81 68 73  Resp:  18 16 17   Temp:   97.7 F (36.5 C) 97.6 F (36.4 C)  TempSrc:   Oral Oral  SpO2: 94% 100% 97% 99%  Weight:      Height:        Constitutional: NAD, calm, comfortable Eyes: PERRL, lids and conjunctivae normal ENMT: Mucous membranes are moist. Posterior pharynx clear of any exudate or lesions.Normal dentition.  Neck: normal, supple, no masses, no thyromegaly Respiratory: clear to auscultation bilaterally, no wheezing, no crackles. Normal respiratory effort. No accessory muscle use.  Cardiovascular: Regular rate and rhythm, no murmurs / rubs / gallops. No extremity edema. 2+ pedal pulses. No carotid bruits.  Abdomen: no tenderness, no masses palpated. No hepatosplenomegaly. Bowel sounds positive.  Musculoskeletal: no clubbing / cyanosis. No joint deformity upper and lower extremities. Good ROM, no contractures. Normal muscle tone.  Skin: no rashes, lesions, ulcers. No induration Neurologic: CN 2-12 grossly intact. Sensation intact, DTR normal. Strength 5/5 in all 4.  Psychiatric: Normal judgment and insight. Alert and oriented x 3. Normal mood.    Labs on Admission: I have personally reviewed following labs and imaging studies  CBC: Recent Labs  Lab 09/04/21 1238  WBC 11.8*  HGB 12.2*  HCT 37.0*  MCV 87.9  PLT 209   Basic Metabolic Panel: Recent Labs  Lab 09/04/21 1238  NA 137  K 4.9  CL 106  CO2 24  GLUCOSE 193*  BUN 28*  CREATININE 1.23  CALCIUM 8.7*   GFR: Estimated Creatinine Clearance: 94.7 mL/min (by C-G formula based on SCr of 1.23 mg/dL). Liver Function Tests: Recent Labs  Lab 09/04/21 1238  AST 15  ALT 17  ALKPHOS 35*  BILITOT 0.8  PROT 6.9   ALBUMIN 3.7   No results for input(s): "LIPASE", "AMYLASE" in the last 168 hours. No results for input(s): "AMMONIA" in the last 168 hours. Coagulation Profile: No results for input(s): "INR", "PROTIME" in the last 168 hours. Cardiac Enzymes: No results for input(s): "CKTOTAL", "CKMB", "CKMBINDEX", "TROPONINI" in the last 168 hours. BNP (last 3 results) No results for input(s): "PROBNP" in the last 8760 hours. HbA1C: No results for input(s): "HGBA1C" in the last 72 hours. CBG: No results for input(s): "GLUCAP" in the last 168 hours. Lipid Profile: No results for input(s): "CHOL", "HDL", "LDLCALC", "TRIG", "CHOLHDL", "LDLDIRECT"  in the last 72 hours. Thyroid Function Tests: No results for input(s): "TSH", "T4TOTAL", "FREET4", "T3FREE", "THYROIDAB" in the last 72 hours. Anemia Panel: No results for input(s): "VITAMINB12", "FOLATE", "FERRITIN", "TIBC", "IRON", "RETICCTPCT" in the last 72 hours. Urine analysis:    Component Value Date/Time   COLORURINE STRAW (A) 04/28/2021 1735   APPEARANCEUR CLEAR 04/28/2021 1735   LABSPEC 1.016 04/28/2021 1735   PHURINE 5.0 04/28/2021 1735   GLUCOSEU 50 (A) 04/28/2021 1735   GLUCOSEU >=1000 (A) 12/12/2014 1555   HGBUR NEGATIVE 04/28/2021 1735   BILIRUBINUR NEGATIVE 04/28/2021 1735   KETONESUR NEGATIVE 04/28/2021 1735   PROTEINUR 30 (A) 04/28/2021 1735   UROBILINOGEN 0.2 12/12/2014 1555   NITRITE NEGATIVE 04/28/2021 1735   LEUKOCYTESUR NEGATIVE 04/28/2021 1735    Radiological Exams on Admission: No results found.  EKG: Independently reviewed.  Sinus rhythm without acute ischemic changes  Assessment/Plan Principal Problem:   GI bleed Active Problems:   Uncontrolled type 2 diabetes mellitus with hyperglycemia, without long-term current use of insulin (HCC)   Essential hypertension   Hyperlipidemia     GI bleeding -Source of bleeding is not entirely clear -Reported to have dark red stools without bright red blood -BUN also noted  mildly elevated -He did not have any abdominal pain -No history of NSAIDs -GI following -We will start on Protonix for now -Clear liquid diet, n.p.o. after midnight  Prior history of stroke -Has been on aspirin and Plavix since 04/2021 -Neurology note during that admission had suggested dual antiplatelet therapy for 21 days followed by monotherapy with aspirin -No prior history of cardiac disease that patient is aware of -We will discontinue Plavix -Resume aspirin when felt reasonable per GI  Diabetes -Hold metformin and glipizide -Reduce home dose of basal insulin by 50% -Follow on sliding scale  Hypertension -Holding antihypertensives for now since he was orthostatic -Resume as blood pressure will allow  Hyperlipidemia -Continue statin  BPH -Continue tamsulosin  Obesity, class III -BMI 41.6 -Needs lifestyle changes  DVT prophylaxis: SCDs Code Status: Full code Family Communication: Discussed with wife at the bedside Disposition Plan: Discharge home once bleeding issues have stabilized Consults called: Gastroenterology Admission status: Inpatient, telemetry  Erick Blinks MD [facility]   If 7PM-7AM, please contact night-coverage [email or web address]   09/04/2021, 7:13 PM

## 2021-09-04 NOTE — Consult Note (Signed)
Gastroenterology Consult   Referring Provider: No ref. provider found Primary Care Physician:  Benita Stabile, MD Primary Gastroenterologist:  Jeani Hawking, MD  Patient ID: Troy Randall; [id number]; 1960/02/03   Admit date: 09/04/2021  LOS: 0 days   Date of Consultation: 09/04/2021  Reason for Consultation:  rectal bleeding    History of Present Illness   Troy Randall is a 61 y.o. male with PMH significant for DM, obesity, HTN, Hyperlipidemia, CVA in 04/2021 on Plavix/ASA who presents with brbpr/diarrhea since this morning.   Patient states he ate out last Thursday. He had fried flounder. Did not taste right. Developed some abdominal bloating and discomfort. Had softer stools over the weekend but this resolved two days ago. This morning he has had about 6 episodes of bloody stools. Initially the first 2 episodes there was some stool present but subsequent episodes were just dark blood. No clots. Some mild abdominal cramping just before BMs. No N/V. Started feeling lightheaded with standing. Went to PCP this morning and was sent to the ED. Last stool about 4 hours ago. Reports unremarkable colonoscopy 02/2021. Says he was told to come back in 10 years. He is not sure if he has diverticulosis. He is on Plavix/ASA but no other NSAIDs. Denies heartburn, dysphagia.   In the ED: Heart rate in the 70s.  Blood pressure initially 149/128 but with systolics dipping into the 90s at times.  Glucose 193, creatinine 1.23, white blood cell count 11,800, hemoglobin 12.2 (13.5 in 04/2021), hematocrit 37, platelets 209,000. On DRE, stool dark and dark blood present. Hemoccult positive.   Prior to Admission medications   Medication Sig Start Date End Date Taking? Authorizing Provider  amLODipine (NORVASC) 10 MG tablet Take 1 tablet (10 mg total) by mouth daily. 06/15/17   Aliene Beams, MD  aspirin EC 81 MG tablet Take 81 mg by mouth daily. Swallow whole.    [provider]  BD VEO INSULIN  SYRINGE U/F 31G X 15/64" 1 ML MISC  USE AS DIRECTED 06/08/17   Aliene Beams, MD  clopidogrel (PLAVIX) 75 MG tablet Take 1 tablet (75 mg total) by mouth daily. 04/30/21   Azucena Fallen, MD  fenofibrate (TRICOR) 145 MG tablet Take 1 tablet (145 mg total) by mouth daily. Patient not taking: Reported on 04/29/2021 06/30/17   Aliene Beams, MD  glipiZIDE (GLUCOTROL XL) 10 MG 24 hr tablet Take 1 tablet (10 mg total) by mouth daily with breakfast. Patient not taking: Reported on 04/29/2021 04/16/17   Aliene Beams, MD  glucose blood (ONETOUCH VERIO) test strip TEST twice a day 05/04/17   Aliene Beams, MD  Insulin Pen Needle (NOVOTWIST) 32G X 5 MM MISC Use two daily to inject Victoza and Toujeo. 04/25/15   Reather Littler, MD  LEVEMIR FLEXTOUCH 100 UNIT/ML Pen INJECT 40 UNITS SUBCUTANEOUSLY TWICE DAILY Patient not taking: Reported on 04/29/2021 09/20/17   Aliene Beams, MD  levocetirizine (XYZAL) 5 MG tablet Take 5 mg by mouth daily. 01/13/21   [provider]  lisinopril (PRINIVIL,ZESTRIL) 40 MG tablet Take 1 tablet (40 mg total) by mouth daily. 05/12/17   Aliene Beams, MD  metFORMIN (GLUCOPHAGE-XR) 500 MG 24 hr tablet Take 500-1,000 mg by mouth in the morning and at bedtime. 02/09/21   [provider]  Dola Argyle LANCETS 33G MISC Use to check blood sugar once a day dx code E11.65 11/21/14   Reather Littler, MD  rosuvastatin (CRESTOR) 40 MG tablet Take 1 tablet (40 mg  total) by mouth daily. 04/29/21   Azucena Fallen, MD  tamsulosin (FLOMAX) 0.4 MG CAPS capsule Take 0.4 mg by mouth daily. 02/12/21   [provider]  traZODone (DESYREL) 150 MG tablet Take 150 mg by mouth at bedtime. 02/04/21   [provider]  TRESIBA FLEXTOUCH 200 UNIT/ML FlexTouch Pen Inject 20 Units into the skin in the morning and at bedtime. 03/14/21   [provider]    Current Facility-Administered Medications  Medication Dose Route Frequency Provider Last Rate Last Admin   0.9 %  sodium  chloride infusion   Intravenous Continuous Erick Blinks, MD       Current Outpatient Medications  Medication Sig Dispense Refill   amLODipine (NORVASC) 10 MG tablet Take 1 tablet (10 mg total) by mouth daily. 90 tablet 3   aspirin EC 81 MG tablet Take 81 mg by mouth daily. Swallow whole.     BD VEO INSULIN SYRINGE U/F 31G X 15/64" 1 ML MISC  USE AS DIRECTED 100 each 2   clopidogrel (PLAVIX) 75 MG tablet Take 1 tablet (75 mg total) by mouth daily. 30 tablet 0   fenofibrate (TRICOR) 145 MG tablet Take 1 tablet (145 mg total) by mouth daily. (Patient not taking: Reported on 04/29/2021) 30 tablet 3   glipiZIDE (GLUCOTROL XL) 10 MG 24 hr tablet Take 1 tablet (10 mg total) by mouth daily with breakfast. (Patient not taking: Reported on 04/29/2021) 90 tablet 1   glucose blood (ONETOUCH VERIO) test strip TEST twice a day 100 each 3   Insulin Pen Needle (NOVOTWIST) 32G X 5 MM MISC Use two daily to inject Victoza and Toujeo. 90 each 5   LEVEMIR FLEXTOUCH 100 UNIT/ML Pen INJECT 40 UNITS SUBCUTANEOUSLY TWICE DAILY (Patient not taking: Reported on 04/29/2021) 30 pen 1   levocetirizine (XYZAL) 5 MG tablet Take 5 mg by mouth daily.     lisinopril (PRINIVIL,ZESTRIL) 40 MG tablet Take 1 tablet (40 mg total) by mouth daily. 90 tablet 3   metFORMIN (GLUCOPHAGE-XR) 500 MG 24 hr tablet Take 500-1,000 mg by mouth in the morning and at bedtime.     ONETOUCH DELICA LANCETS 33G MISC Use to check blood sugar once a day dx code E11.65 50 each 3   rosuvastatin (CRESTOR) 40 MG tablet Take 1 tablet (40 mg total) by mouth daily. 30 tablet 1   tamsulosin (FLOMAX) 0.4 MG CAPS capsule Take 0.4 mg by mouth daily.     traZODone (DESYREL) 150 MG tablet Take 150 mg by mouth at bedtime.     TRESIBA FLEXTOUCH 200 UNIT/ML FlexTouch Pen Inject 20 Units into the skin in the morning and at bedtime.      Allergies as of 09/04/2021 - Review Complete 09/04/2021  Allergen Reaction Noted   Metformin and related  03/31/2017   Penicillins  Hives 03/30/2011    Past Medical History:  Diagnosis Date   Anemia    Arthritis    Diabetes mellitus without complication (HCC)    Hyperlipidemia    Hypertension    Kidney stones     Past Surgical History:  Procedure Laterality Date   ANKLE SURGERY     HERNIA REPAIR     umbilical x1 Incisional x1   INCISIONAL HERNIA REPAIR  04/13/2011   Procedure: LAPAROSCOPIC INCISIONAL HERNIA;  Surgeon: Dalia Heading, MD;  Location: [facility];  Service: General;  Laterality: N/A;  Recurrent Laparoscopic Incisional Herniorraphy with Mesh   KIDNEY STONE SURGERY      Family  History  Problem Relation Age of Onset   Heart failure Mother    Diabetes Father    Cancer Other    Heart attack Other    Anesthesia problems Neg Hx    Hypotension Neg Hx    Malignant hyperthermia Neg Hx    Pseudochol deficiency Neg Hx    Colon cancer Neg Hx    Inflammatory bowel disease Neg Hx     Social History   Socioeconomic History   Marital status: Married    Spouse name: Not on file   Number of children: Not on file   Years of education: Not on file   Highest education level: Not on file  Occupational History   Not on file  Tobacco Use   Smoking status: Never   Smokeless tobacco: Current    Types: Snuff  Vaping Use   Vaping Use: Never used  Substance and Sexual Activity   Alcohol use: No   Drug use: No   Sexual activity: Yes    Birth control/protection: None  Other Topics Concern   Not on file  Social History Narrative   Live in [place], [place].   Married for over 30 years.   Has 2 children, age 68/30s. Has one grandchild, 19 months.    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    Eats all food groups.   Wears seatbelt.    Dip 1 can a day.    Social Determinants of Health   Financial Resource Strain: Not on file  Food Insecurity: Not on file  Transportation Needs: Not on file  Physical Activity: Not on file  Stress: Not on file  Social Connections: Not on  file  Intimate Partner Violence: Not on file     Review of System:   General: Negative for anorexia, weight loss, fever, chills, fatigue,+ weakness. Lightheaded with standing, starting today with bleeding.  Eyes: Negative for vision changes.  ENT: Negative for hoarseness, difficulty swallowing , nasal congestion. CV: Negative for chest pain, angina, palpitations, dyspnea on exertion, peripheral edema.  Respiratory: Negative for dyspnea at rest, dyspnea on exertion, cough, sputum, wheezing.  GI: See history of present illness. GU:  Negative for dysuria, hematuria, urinary incontinence, urinary frequency, nocturnal urination.  MS: Negative for joint pain, low back pain.  Derm: Negative for rash or itching.  Neuro: Negative for weakness, abnormal sensation, seizure, frequent headaches, memory loss, confusion.  Psych: Negative for anxiety, depression, suicidal ideation, hallucinations.  Endo: Negative for unusual weight change.  Heme: Negative for bruising or bleeding. Allergy: Negative for rash or hives.      Physical Examination:   Vital signs in last 24 hours: Temp:  [97.8 F (36.6 C)] 97.8 F (36.6 C) (08/31 1223) Pulse Rate:  [72-81] 81 (08/31 1400) Resp:  [18] 18 (08/31 1400) BP: (93-149)/(61-128) 125/74 (08/31 1400) SpO2:  [94 %-100 %] 100 % (08/31 1400) Weight:  [139.3 kg] 139.3 kg (08/31 1222)    General: obese, well-developed in no acute distress.  Head: Normocephalic, atraumatic.   Eyes: Conjunctiva pink, no icterus. Mouth: Oropharyngeal mucosa moist and pink , no lesions erythema or exudate. Neck: Supple without thyromegaly, masses, or lymphadenopathy.  Lungs: Clear to auscultation bilaterally.  Heart: Regular rate and rhythm, no murmurs rubs or gallops.  Abdomen: Bowel sounds are normal,  nondistended, no hepatosplenomegaly or masses, no abdominal bruits or hernia , no rebound or guarding.  Minimal diffuse tenderness with palpation. Rectal: as noted in ED  report Extremities: No lower extremity edema, clubbing, deformity.  Neuro: Alert and oriented x 4 , grossly normal neurologically.  Skin: Warm and dry, no rash or jaundice.   Psych: Alert and cooperative, normal mood and affect.        Intake/Output from previous day: No intake/output data recorded. Intake/Output this shift: No intake/output data recorded.  Lab Results:   CBC Recent Labs    09/04/21 1238  WBC 11.8*  HGB 12.2*  HCT 37.0*  MCV 87.9  PLT 209   BMET Recent Labs    09/04/21 1238  NA 137  K 4.9  CL 106  CO2 24  GLUCOSE 193*  BUN 28*  CREATININE 1.23  CALCIUM 8.7*   LFT Recent Labs    09/04/21 1238  BILITOT 0.8  ALKPHOS 35*  AST 15  ALT 17  PROT 6.9  ALBUMIN 3.7    Lipase No results for input(s): "LIPASE" in the last 72 hours.  PT/INR No results for input(s): "LABPROT", "INR" in the last 72 hours.   Hepatitis Panel No results for input(s): "HEPBSAG", "HCVAB", "HEPAIGM", "HEPBIGM" in the last 72 hours.   Imaging Studies:   No results found.Pierre.Alas week]  Assessment:   61 y/o male with history of CVA (on Plavix/ASA sine 04/2021), HTN, DM, hyperlipidemia presenting with acute onset rectal bleeding which began today.  GI bleeding: six episodes of dark blood per rectum without reported melena. Last episode about 4 hours ago. Complains of orthostatic symptoms with standing. No significant abdominal pain. Had couple of days of abdominal cramping with softer stools over the weekend but was feeling better prior to onset of bleeding. Colonoscopy up to date, report no viewable but patient reports unremarkable with next colonoscopy planned for 10 years. Hgb 12.2 down from 04/2021 (13.5).   Etiology of GI bleeding unclear but certainly could be due to diverticular bleed, ischemia, infectious. Inflammatory, malignancy unlikely. Doubt rapid transit UGI bleed.   Plan:   Supportive measures, monitor overnight.  Clear liquid diet.  Trend H/H.  If ongoing  bleeding, significant decline in H/H, he may require CTA and/or colonoscopy. Reassess in AM.    LOS: 0 days   We would like to thank you for the opportunity to participate in the care of [name].  Leanna Battles. [facility] [phone] 8/31/20232:57 PM

## 2021-09-05 ENCOUNTER — Inpatient Hospital Stay (HOSPITAL_COMMUNITY): Payer: Medicare HMO

## 2021-09-05 DIAGNOSIS — K8020 Calculus of gallbladder without cholecystitis without obstruction: Secondary | ICD-10-CM | POA: Diagnosis not present

## 2021-09-05 DIAGNOSIS — I10 Essential (primary) hypertension: Secondary | ICD-10-CM | POA: Diagnosis not present

## 2021-09-05 DIAGNOSIS — E1165 Type 2 diabetes mellitus with hyperglycemia: Secondary | ICD-10-CM | POA: Diagnosis not present

## 2021-09-05 DIAGNOSIS — K922 Gastrointestinal hemorrhage, unspecified: Secondary | ICD-10-CM | POA: Diagnosis not present

## 2021-09-05 DIAGNOSIS — E785 Hyperlipidemia, unspecified: Secondary | ICD-10-CM | POA: Diagnosis not present

## 2021-09-05 DIAGNOSIS — N200 Calculus of kidney: Secondary | ICD-10-CM | POA: Diagnosis not present

## 2021-09-05 LAB — ABO/RH: ABO/RH(D): A NEG

## 2021-09-05 LAB — CBC
HCT: 25.2 % — ABNORMAL LOW (ref 39.0–52.0)
HCT: 23.4 % — ABNORMAL LOW (ref 39.0–52.0)
HCT: 26.1 % — ABNORMAL LOW (ref 39.0–52.0)
Hemoglobin: 8.5 g/dL — ABNORMAL LOW (ref 13.0–17.0)
Hemoglobin: 8 g/dL — ABNORMAL LOW (ref 13.0–17.0)
Hemoglobin: 8.8 g/dL — ABNORMAL LOW (ref 13.0–17.0)
MCH: 29.8 pg (ref 26.0–34.0)
MCH: 29.9 pg (ref 26.0–34.0)
MCH: 29.4 pg (ref 26.0–34.0)
MCHC: 33.7 g/dL (ref 30.0–36.0)
MCHC: 34.2 g/dL (ref 30.0–36.0)
MCHC: 33.7 g/dL (ref 30.0–36.0)
MCV: 88.4 fL (ref 80.0–100.0)
MCV: 87.3 fL (ref 80.0–100.0)
MCV: 87.3 fL (ref 80.0–100.0)
Platelets: 184 10*3/uL (ref 150–400)
Platelets: 161 10*3/uL (ref 150–400)
Platelets: 172 10*3/uL (ref 150–400)
RBC: 2.85 MIL/uL — ABNORMAL LOW (ref 4.22–5.81)
RBC: 2.68 MIL/uL — ABNORMAL LOW (ref 4.22–5.81)
RBC: 2.99 MIL/uL — ABNORMAL LOW (ref 4.22–5.81)
RDW: 12.4 % (ref 11.5–15.5)
RDW: 12.7 % (ref 11.5–15.5)
RDW: 13 % (ref 11.5–15.5)
WBC: 8.5 10*3/uL (ref 4.0–10.5)
WBC: 8.5 10*3/uL (ref 4.0–10.5)
WBC: 10.4 10*3/uL (ref 4.0–10.5)
nRBC: 0 % (ref 0.0–0.2)
nRBC: 0 % (ref 0.0–0.2)
nRBC: 0 % (ref 0.0–0.2)

## 2021-09-05 LAB — BASIC METABOLIC PANEL
Anion gap: 3 — ABNORMAL LOW (ref 5–15)
BUN: 25 mg/dL — ABNORMAL HIGH (ref 6–20)
CO2: 24 mmol/L (ref 22–32)
Calcium: 7.6 mg/dL — ABNORMAL LOW (ref 8.9–10.3)
Chloride: 111 mmol/L (ref 98–111)
Creatinine, Ser: 1 mg/dL (ref 0.61–1.24)
GFR, Estimated: >60 mL/min (ref >60)
Glucose, Bld: 166 mg/dL — ABNORMAL HIGH (ref 70–99)
Potassium: 4.5 mmol/L (ref 3.5–5.1)
Sodium: 138 mmol/L (ref 135–145)

## 2021-09-05 LAB — GLUCOSE, CAPILLARY
Glucose-Capillary: 208 mg/dL — ABNORMAL HIGH (ref 70–99)
Glucose-Capillary: 193 mg/dL — ABNORMAL HIGH (ref 70–99)
Glucose-Capillary: 210 mg/dL — ABNORMAL HIGH (ref 70–99)
Glucose-Capillary: 166 mg/dL — ABNORMAL HIGH (ref 70–99)
Glucose-Capillary: 206 mg/dL — ABNORMAL HIGH (ref 70–99)

## 2021-09-05 LAB — PROTIME-INR
INR: 1.2 (ref 0.8–1.2)
Prothrombin Time: 15.2 seconds (ref 11.4–15.2)

## 2021-09-05 LAB — PREPARE RBC (CROSSMATCH)

## 2021-09-05 MED ORDER — SODIUM CHLORIDE 0.9% IV SOLUTION: Freq: Once | INTRAVENOUS | Status: DC

## 2021-09-05 MED ORDER — IOHEXOL 350 MG/ML SOLN
100.0000 mL | Freq: Once | INTRAVENOUS | Status: AC | PRN
  Administered 2021-09-05: 100 mL via INTRAVENOUS

## 2021-09-05 MED ORDER — ORAL CARE MOUTH RINSE: 15.0000 mL | OROMUCOSAL | Status: DC | PRN

## 2021-09-05 MED ORDER — MIDODRINE HCL 5 MG PO TABS
10.0000 mg | ORAL_TABLET | Freq: Three times a day (TID) | ORAL | Status: DC
  Administered 2021-09-05 – 2021-09-06 (×4): 10 mg via ORAL
  Filled 2021-09-05 (×4): qty 2

## 2021-09-05 MED ORDER — SODIUM CHLORIDE 0.9 % IV BOLUS
1000.0000 mL | Freq: Once | INTRAVENOUS | Status: AC
  Administered 2021-09-05: 1000 mL via INTRAVENOUS

## 2021-09-05 MED ORDER — PEG 3350-KCL-NA BICARB-NACL 420 G PO SOLR
4000.0000 mL | Freq: Once | ORAL | Status: AC
  Administered 2021-09-05: 4000 mL via ORAL

## 2021-09-05 MED ORDER — MELATONIN 3 MG PO TABS
6.0000 mg | ORAL_TABLET | Freq: Every day | ORAL | Status: DC
  Administered 2021-09-05: 6 mg via ORAL
  Filled 2021-09-05: qty 2

## 2021-09-05 MED ORDER — PEG 3350-KCL-NA BICARB-NACL 420 G PO SOLR: 4000.0000 mL | Freq: Once | ORAL | Status: DC

## 2021-09-05 NOTE — Progress Notes (Signed)
Gastroenterology Progress Note   Referring Provider: No ref. provider found Primary Care Physician:  Benita Stabile, MD Primary Gastroenterologist:  Dr. Elnoria Howard  Patient ID: Troy Randall; [id number]; 1960/07/07    Subjective   Denies abdominal pain. Continues to have rectal bleeding. He reports orthostatic events last night and discomfort with not being able to get out of the bed. He is ready to get some answers. He was tolerating clears without any difficulty prior to being NPO.     Objective   Vital signs in last 24 hours Temp:  [97.1 F (36.2 C)-98.7 F (37.1 C)] 97.9 F (36.6 C) (09/01 0808) Pulse Rate:  [68-95] 91 (09/01 0808) Resp:  [15-20] 19 (09/01 0808) BP: (73-149)/(40-128) 105/63 (09/01 0808) SpO2:  [94 %-100 %] 99 % (09/01 0808) Weight:  [139.3 kg] 139.3 kg (08/31 1222) Last BM Date : 09/05/21  Physical Exam General:   Alert and oriented, pleasant Head:  Normocephalic and atraumatic. Eyes:  No icterus, sclera clear. Conjuctiva pink.  Mouth:  Without lesions, mucosa pink and moist.  Neck:  Supple, without thyromegaly or masses.  Heart:  S1, S2 present, no murmurs noted.  Lungs: Clear to auscultation bilaterally, without wheezing, rales, or rhonchi.  Abdomen:  Bowel sounds present, soft, non-tender, non-distended. No HSM or hernias noted. No rebound or guarding. No masses appreciated  Msk:  Symmetrical without gross deformities. Normal posture. Pulses:  Normal pulses noted. Extremities:  Without clubbing or edema. Neurologic:  Alert and  oriented x4;  grossly normal neurologically. Skin:  Warm and dry, intact without significant lesions.  Cervical Nodes:  No significant cervical adenopathy. Psych:  Alert and cooperative. Normal mood and affect.  Intake/Output from previous day: 08/31 0701 - 09/01 0700 In: 2398.6 [I.V.:902.1; IV Piggyback:1496.5] Out: -  Intake/Output this shift: Total I/O In: 740 [P.O.:240; I.V.:500] Out: -   Lab Results  Recent Labs     09/04/21 1238 09/05/21 0008 09/05/21 0307  WBC 11.8*  --  8.5  HGB 12.2* 9.2* 8.5*  HCT 37.0* 27.3* 25.2*  PLT 209  --  184   BMET Recent Labs    09/04/21 1238 09/05/21 0307  NA 137 138  K 4.9 4.5  CL 106 111  CO2 24 24  GLUCOSE 193* 166*  BUN 28* 25*  CREATININE 1.23 1.00  CALCIUM 8.7* 7.6*   LFT Recent Labs    09/04/21 1238  PROT 6.9  ALBUMIN 3.7  AST 15  ALT 17  ALKPHOS 35*  BILITOT 0.8   PT/INR Recent Labs    09/05/21 0307  LABPROT 15.2  INR 1.2   Hepatitis Panel No results for input(s): "HEPBSAG", "HCVAB", "HEPAIGM", "HEPBIGM" in the last 72 hours.   Studies/Results No results found.  Assessment  61 y.o. male with a history of CVA (Plavix/ASA since April 2023), HTN, DM, hyperlipidemia presenting with acute onset rectal bleeding which began yesterday.  GI bleed: Patient reports 6 episodes of dark blood per rectum without reported melena yesterday.  He also complained of orthostatic symptoms with standing but denies any abdominal pain.  He did report COVID's abdominal cramping with softer stools over the weekend but was feeling better prior to onset of bleeding.  He reported unremarkable colonoscopy in February 2023 and was advised repeat in 10 years.  He reported he was unsure about diverticulosis.  Hemoglobin 13.5 in April 2023 and presented with hemoglobin 12.2.  He experienced orthostatic event overnight with hypotension with systolics in the 80s and hemoglobin rechecked with  drop to 9.2 at midnight and down to 8.5 at 3 AM. He did receive 1L fluid bolus after the hypotensive event. Continues to have brihgt red blood with clots observed by me this AM. No pain on exam. Denies chest pain. Needs frequent monitoring of H/H and stat CTA.   Plan / Recommendations  STAT CTA for GI bleed Continue supportive measures May need colonoscopy if CTA negative.  NPO CBC q6h, Continue to trend H/H, transfuse for hgb < 7 Continue to hold Plavix/ASA    LOS: 1 day     09/05/2021, 9:20 AM   Brooke Bonito, MSN, FNP-BC, AGACNP-BC [facility]

## 2021-09-05 NOTE — Progress Notes (Signed)
Pt. Was sitting up on side of bed wanting to go to the bathroom. Pt. Had a large dark red BM. When pt. Was walking back to bed pt. Stated that he felt dizzy again and started mumbling incoherently. Nurse called rapid response. Pt. BP 84/52. New orders from MD for bolus, 12-Lead EKG, 10mg  of midorine, and bedrest. Will continue to monitor.

## 2021-09-05 NOTE — Plan of Care (Signed)
  Problem: Education: Goal: Knowledge of General Education information will improve Description Including pain rating scale, medication(s)/side effects and non-pharmacologic comfort measures Outcome: Progressing   Problem: Health Behavior/Discharge Planning: Goal: Ability to manage health-related needs will improve Outcome: Progressing   

## 2021-09-05 NOTE — Care Management Important Message (Signed)
Important Message  Patient Details  Name: Troy Randall MRN: [id number] Date of Birth: March 22, 1960   Medicare Important Message Given:  Yes     Corey Harold 09/05/2021, 12:39 PM

## 2021-09-05 NOTE — Progress Notes (Signed)
Rapid response called when patient stood up and became light headed. SBP in 80s. This is the third time that this has happened to this patient since being here, and the second time on this shift. Patient will be on bed rest with bed pan for now. Hgb was checked the last time he stood up and became light headed. It was down to 9.2 from earlier 12.2 and a baseline of 13.5. Morning hgb is pending now. Patients symptoms resolved upon laying  down in the bed. 1 liter bolus done. Ekg shows sinus rhythm. Patient has no complaints of chest pain, dyspnea, dizziness, or other complaints at this time. RN said he was speaking incoherently - but patient is currently speaking clearly and is oriented x 4. The patient's main concern is that he has not slept.  Plan is to monitor the AM hgb, transfuse as indicated, and ultimately GI will be needed to fix the bleed. GI is already f ollowing.

## 2021-09-05 NOTE — TOC Progression Note (Signed)
Transition of Care [facility]) - Progression Note    Patient Details  Name: Troy Randall MRN: [id number] Date of Birth: [birth year]  Transition of Care [facility]) CM/SW Contact  Karn Cassis, [place] Phone Number: 09/05/2021, 10:14 AM  Clinical Narrative:  Transition of Care [facility]) Screening Note   Patient Details  Name: Troy Randall Date of Birth: 28-May-1960   Transition of Care [facility]) CM/SW Contact:    Karn Cassis, LCSW Phone Number: 09/05/2021, 10:14 AM    Transition of Care Department [facility]) has reviewed patient and no TOC needs have been identified at this time. We will continue to monitor patient advancement through interdisciplinary progression rounds. If new patient transition needs arise, please place a TOC consult.             Expected Discharge Plan and Services                                                 Social Determinants of Health (SDOH) Interventions    Readmission Risk Interventions     No data to display

## 2021-09-05 NOTE — Progress Notes (Signed)
PROGRESS NOTE    Troy Randall  ZOX:[id number] DOB: January 16, 1960 DOA: 09/04/2021 PCP: Benita Stabile, MD    Brief Narrative:  Troy Randall is a 61 y.o. male with medical history significant of hypertension, hyperlipidemia, diabetes, prior stroke on aspirin and Plavix, who was in his usual state of health when this morning he developed frequent dark stools.  Denies any bright red blood per rectum.  No nausea or vomiting.  No abdominal pain.  He was feeling dizzy and lightheaded on standing.  Felt that "his breathing was not right".  Denied any chest pain.  Reports having 6-7 dark bowel movements today.  1 of those bowel movements with after he arrived to the hospital.  No history of NSAID use.   Assessment & Plan:   Principal Problem:   GI bleed Active Problems:   Uncontrolled type 2 diabetes mellitus with hyperglycemia, without long-term current use of insulin (HCC)   Essential hypertension   Hyperlipidemia   GI bleeding -Source of bleeding is not entirely clear -He has had continued dark red stools -He did not have any abdominal pain -No history of NSAIDs -GI following -CTA abdomen pelvis did not show any active bleed -Plans are for colonoscopy in a.m. -Clear liquid diet, n.p.o. after midnight  Acute blood loss anemia secondary to GI bleeding -Hemoglobin 12.2 on admission -Down to 8.0 -He was also noted to be orthostatic/hypotensive -He was transfused 2 units of PRBC -Continue serial hemoglobin checks   Prior history of stroke -Has been on aspirin and Plavix since 04/2021 -Neurology note during that admission had suggested dual antiplatelet therapy for 21 days followed by monotherapy with aspirin -No prior history of cardiac disease that patient is aware of -We will discontinue Plavix -Resume aspirin when felt reasonable per GI   Diabetes -Hold metformin and glipizide -Reduce home dose of basal insulin by 50% -Follow on sliding scale   Hypertension -Holding  antihypertensives for now since he was orthostatic -Resume as blood pressure will allow   Hyperlipidemia -Continue statin   BPH -Holding tamsulosin until orthostatics stabilize   Obesity, class III -BMI 41.6 -Needs lifestyle changes   DVT prophylaxis: Place and maintain sequential compression device Start: 09/04/21 1914  Code Status: Full code Family Communication: Discussed with wife at the bedside Disposition Plan: Status is: Inpatient Remains inpatient appropriate because: Continued inpatient work-up for GI bleeding     Consultants:  Gastroenterology  Procedures:    Antimicrobials:      Subjective: Was continually orthostatic overnight.  Had persistent bloody bowel movements.  Denies any abdominal pain.  Objective: Vitals:   09/05/21 1004 09/05/21 1357 09/05/21 1427 09/05/21 1701  BP: 106/66 119/63 120/66 108/67  Pulse: 89 92 84 93  Resp: 18 16 16 16   Temp: 98.2 F (36.8 C) 98.6 F (37 C) 98.2 F (36.8 C) 98.1 F (36.7 C)  TempSrc: Oral Oral  Oral  SpO2: 97% 97%  97%  Weight:      Height:        Intake/Output Summary (Last 24 hours) at 09/05/2021 1927 Last data filed at 09/05/2021 1803 Gross per 24 hour  Intake 3567.94 ml  Output --  Net 3567.94 ml   Filed Weights   09/04/21 1222  Weight: (!) 139.3 kg    Examination:  General exam: Appears calm and comfortable  Respiratory system: Clear to auscultation. Respiratory effort normal. Cardiovascular system: S1 & S2 heard, RRR. No JVD, murmurs, rubs, gallops or clicks. No pedal edema. Gastrointestinal system: Abdomen  is nondistended, soft and nontender. No organomegaly or masses felt. Normal bowel sounds heard. Central nervous system: Alert and oriented. No focal neurological deficits. Extremities: Symmetric 5 x 5 power. Skin: No rashes, lesions or ulcers Psychiatry: Judgement and insight appear normal. Mood & affect appropriate.     Data Reviewed: I have personally reviewed following labs and  imaging studies  CBC: Recent Labs  Lab 09/04/21 1238 09/05/21 0008 09/05/21 0307 09/05/21 1112  WBC 11.8*  --  8.5 8.5  HGB 12.2* 9.2* 8.5* 8.0*  HCT 37.0* 27.3* 25.2* 23.4*  MCV 87.9  --  88.4 87.3  PLT 209  --  184 161   Basic Metabolic Panel: Recent Labs  Lab 09/04/21 1238 09/05/21 0307  NA 137 138  K 4.9 4.5  CL 106 111  CO2 24 24  GLUCOSE 193* 166*  BUN 28* 25*  CREATININE 1.23 1.00  CALCIUM 8.7* 7.6*   GFR: Estimated Creatinine Clearance: 116.5 mL/min (by C-G formula based on SCr of 1 mg/dL). Liver Function Tests: Recent Labs  Lab 09/04/21 1238  AST 15  ALT 17  ALKPHOS 35*  BILITOT 0.8  PROT 6.9  ALBUMIN 3.7   No results for input(s): "LIPASE", "AMYLASE" in the last 168 hours. No results for input(s): "AMMONIA" in the last 168 hours. Coagulation Profile: Recent Labs  Lab 09/05/21 0307  INR 1.2   Cardiac Enzymes: No results for input(s): "CKTOTAL", "CKMB", "CKMBINDEX", "TROPONINI" in the last 168 hours. BNP (last 3 results) No results for input(s): "PROBNP" in the last 8760 hours. HbA1C: No results for input(s): "HGBA1C" in the last 72 hours. CBG: Recent Labs  Lab 09/04/21 2218 09/05/21 0521 09/05/21 0746 09/05/21 1151 09/05/21 1601  GLUCAP 134* 208* 193* 210* 166*   Lipid Profile: No results for input(s): "CHOL", "HDL", "LDLCALC", "TRIG", "CHOLHDL", "LDLDIRECT" in the last 72 hours. Thyroid Function Tests: No results for input(s): "TSH", "T4TOTAL", "FREET4", "T3FREE", "THYROIDAB" in the last 72 hours. Anemia Panel: No results for input(s): "VITAMINB12", "FOLATE", "FERRITIN", "TIBC", "IRON", "RETICCTPCT" in the last 72 hours. Sepsis Labs: No results for input(s): "PROCALCITON", "LATICACIDVEN" in the last 168 hours.  No results found for this or any previous visit (from the past 240 hour(s)).       Radiology Studies: CT Angio Abd/Pel w/ and/or w/o  Result Date: 09/05/2021 CLINICAL DATA:  61 year old with rectal bleeding. EXAM: CTA  ABDOMEN AND PELVIS WITHOUT AND WITH CONTRAST TECHNIQUE: Multidetector CT imaging of the abdomen and pelvis was performed using the standard protocol during bolus administration of intravenous contrast. Multiplanar reconstructed images and MIPs were obtained and reviewed to evaluate the vascular anatomy. RADIATION DOSE REDUCTION: This exam was performed according to the departmental dose-optimization program which includes automated exposure control, adjustment of the mA and/or kV according to patient size and/or use of iterative reconstruction technique. CONTRAST:  OMNIPAQUE IOHEXOL 350 MG/ML SOLN COMPARISON:  None Available. FINDINGS: VASCULAR Aorta: Normal caliber aorta without aneurysm, dissection, vasculitis or significant stenosis. Celiac: Patent without evidence of aneurysm, dissection, vasculitis or significant stenosis. Mild narrowing in the proximal celiac artery is likely related to median arcuate ligament compression. SMA: Replaced right hepatic artery which is a normal variant. SMA is widely patent without aneurysm or dissection. Renals: Bilateral renal arteries are patent without evidence of aneurysm, dissection, vasculitis, fibromuscular dysplasia or significant stenosis. Single accessory left renal artery and 2 small accessory right renal arteries. Most inferior right accessory renal artery originates from the proximal right common iliac artery. IMA: Patent without evidence  of aneurysm, dissection, vasculitis or significant stenosis. Inflow: Patent without evidence of aneurysm, dissection, vasculitis or significant stenosis. Proximal Outflow: Proximal femoral arteries are patent bilaterally. Veins: Portal venous system is patent. IVC is patent. There is a retroaortic left renal vein which is a normal variant. IVC and iliac veins are patent. Review of the MIP images confirms the above findings. NON-VASCULAR Lower chest: Several small nodules at the both lung bases. Largest nodule measures 4 mm in  the left lower lobe on sequence 6 image 37. Coronary artery calcifications. Hepatobiliary: Layering gallstones. Mild distention of the gallbladder without surrounding inflammatory changes. Normal appearance of the liver without intrahepatic or extrahepatic biliary dilatation. Pancreas: Unremarkable. No pancreatic ductal dilatation or surrounding inflammatory changes. Spleen: Normal in size without focal abnormality. Adrenals/Urinary Tract: Normal adrenals bilaterally. 3 mm stone in the right kidney interpolar region. Probably 3 small stones in the left kidney, largest is in the left interpolar region measuring 3 mm. Negative for hydronephrosis. No suspicious renal lesions. 1.2 cm low-density structure in the right kidney interpolar region is most compatible with a cyst and does not require dedicated follow-up. Normal appearance of the urinary bladder. Stomach/Bowel: Duodenal diverticula near the pancreatic head. Another diverticulum in the distal duodenum. Diverticulum involving the small bowel on sequence 13, image 88 measuring up to 1.7 cm. Additional small bowel diverticulum on sequence 13 image 111 measuring up to 2.1 cm. No evidence for bowel dilatation or focal bowel inflammation. No evidence for active GI bleeding. Normal appendix without inflammatory changes. Lymphatic: No lymph node enlargement in the abdomen or pelvis. Reproductive: Prostate is unremarkable. Other: There is a small complex periumbilical ventral hernia containing fat. Surgical mesh along the anterior abdominal wall. Negative for ascites. Negative for free air. Musculoskeletal: No acute bone abnormality. IMPRESSION: VASCULAR 1. No evidence for active GI bleeding. 2. No significant atherosclerotic disease in the abdomen or pelvis. 3. Coronary artery calcifications. 4. Main visceral arteries are patent with variant anatomy. Replaced right hepatic artery and bilateral accessory renal arteries. NON-VASCULAR 1. No acute abnormality in the abdomen  or pelvis. 2. Bilateral nonobstructive nephrolithiasis. 3. Cholelithiasis. 4. Multiple pulmonary nodules. Most severe: 4 mm left solid pulmonary nodule. Per Fleischner Society Guidelines, no routine follow-up imaging is recommended. These guidelines do not apply to immunocompromised patients and patients with cancer. Follow up in patients with significant comorbidities as clinically warranted. For lung cancer screening, adhere to Lung-RADS guidelines. Reference: Radiology. 2017; 284(1):228-43. 5. Postsurgical changes along the anterior abdominal wall with complex ventral hernia containing fat. 6. Duodenal and jejunal diverticula. Electronically Signed   By: Richarda Overlie M.D.   On: 09/05/2021 12:47        Scheduled Meds:  sodium chloride   Intravenous Once   sodium chloride   Intravenous Once   insulin aspart  0-15 Units Subcutaneous TID WC   insulin aspart  0-5 Units Subcutaneous QHS   insulin glargine-yfgn  10 Units Subcutaneous BID   melatonin  6 mg Oral QHS   midodrine  10 mg Oral TID WC   pantoprazole (PROTONIX) IV  40 mg Intravenous Q24H   rosuvastatin  40 mg Oral Daily   traZODone  150 mg Oral QHS   Continuous Infusions:  sodium chloride 100 mL/hr at 09/05/21 1803     LOS: 1 day    Time spent:    Erick Blinks, MD [facility]   If 7PM-7AM, please contact night-coverage [email or web address]  09/05/2021, 7:27 PM

## 2021-09-05 NOTE — Progress Notes (Cosign Needed)
CTA Abdomen/pelvis  with no evidence of active GI bleed, Main visceral arteries are patent with variant anatomy. Replaced right hepatic artery and bilateral accessory renal arteriesbilateral nonobstructing nephrolithiasis, cholelithiasis, multiple pulmonary nodules, postsurgical changes in the anterior abdominal wall with complex regional hernia containing fat, duodenal and jejunal diverticula.  Discussed with Dr. Levon Hedger: Given no evidence of active GI bleed, we will proceed with colonoscopy tomorrow for further evaluation of rectal bleeding and acute blood loss anemia, patient agreeable.  He is currently receiving 1 unit of blood.  Continue to monitor CBC every 6 hours and transfuse for hemoglobin less than 7. I have discussed the risks, alternatives, benefits with regards to but not limited to the risk of reaction to medication, bleeding, infection, perforation and the patient is agreeable to proceed. Written consent to be obtained.   Clear liquid diet now N.p.o. at midnight Prep with Nulytely starting now Tapwater enema at 6 AM  Brooke Bonito, MSN, APRN, FNP-BC, AGACNP-BC [facility]

## 2021-09-06 ENCOUNTER — Inpatient Hospital Stay: Payer: Self-pay

## 2021-09-06 ENCOUNTER — Inpatient Hospital Stay (HOSPITAL_COMMUNITY): Payer: Medicare HMO

## 2021-09-06 ENCOUNTER — Encounter (HOSPITAL_COMMUNITY)
Admission: EM | Disposition: A | Discharge status: Rehabilitation Facility | Payer: Self-pay | Source: Home / Self Care | Attending: Family Medicine

## 2021-09-06 ENCOUNTER — Inpatient Hospital Stay (HOSPITAL_COMMUNITY): Payer: Medicare HMO | Admitting: Anesthesiology

## 2021-09-06 ENCOUNTER — Encounter (HOSPITAL_COMMUNITY): Payer: Self-pay | Admitting: Internal Medicine

## 2021-09-06 DIAGNOSIS — D62 Acute posthemorrhagic anemia: Secondary | ICD-10-CM

## 2021-09-06 DIAGNOSIS — K5710 Diverticulosis of small intestine without perforation or abscess without bleeding: Secondary | ICD-10-CM

## 2021-09-06 DIAGNOSIS — R578 Other shock: Secondary | ICD-10-CM | POA: Diagnosis not present

## 2021-09-06 DIAGNOSIS — K449 Diaphragmatic hernia without obstruction or gangrene: Secondary | ICD-10-CM

## 2021-09-06 DIAGNOSIS — K810 Acute cholecystitis: Secondary | ICD-10-CM | POA: Diagnosis not present

## 2021-09-06 DIAGNOSIS — J9601 Acute respiratory failure with hypoxia: Secondary | ICD-10-CM | POA: Diagnosis not present

## 2021-09-06 DIAGNOSIS — E785 Hyperlipidemia, unspecified: Secondary | ICD-10-CM | POA: Diagnosis not present

## 2021-09-06 DIAGNOSIS — K921 Melena: Secondary | ICD-10-CM | POA: Diagnosis not present

## 2021-09-06 DIAGNOSIS — K922 Gastrointestinal hemorrhage, unspecified: Secondary | ICD-10-CM | POA: Diagnosis not present

## 2021-09-06 DIAGNOSIS — K3189 Other diseases of stomach and duodenum: Secondary | ICD-10-CM

## 2021-09-06 DIAGNOSIS — I10 Essential (primary) hypertension: Secondary | ICD-10-CM | POA: Diagnosis not present

## 2021-09-06 DIAGNOSIS — R6521 Severe sepsis with septic shock: Secondary | ICD-10-CM | POA: Diagnosis not present

## 2021-09-06 DIAGNOSIS — A419 Sepsis, unspecified organism: Secondary | ICD-10-CM | POA: Diagnosis not present

## 2021-09-06 DIAGNOSIS — K8020 Calculus of gallbladder without cholecystitis without obstruction: Secondary | ICD-10-CM | POA: Diagnosis not present

## 2021-09-06 DIAGNOSIS — R0602 Shortness of breath: Secondary | ICD-10-CM | POA: Diagnosis not present

## 2021-09-06 DIAGNOSIS — N200 Calculus of kidney: Secondary | ICD-10-CM | POA: Diagnosis not present

## 2021-09-06 DIAGNOSIS — E1165 Type 2 diabetes mellitus with hyperglycemia: Secondary | ICD-10-CM | POA: Diagnosis not present

## 2021-09-06 HISTORY — PX: GIVENS CAPSULE STUDY: SHX5432

## 2021-09-06 HISTORY — PX: IR US GUIDE VASC ACCESS RIGHT: IMG2390

## 2021-09-06 HISTORY — PX: IR EMBO ART  VEN HEMORR LYMPH EXTRAV  INC GUIDE ROADMAPPING: IMG5450

## 2021-09-06 HISTORY — PX: ESOPHAGOGASTRODUODENOSCOPY (EGD) WITH PROPOFOL: SHX5813

## 2021-09-06 HISTORY — PX: COLONOSCOPY WITH PROPOFOL: SHX5780

## 2021-09-06 HISTORY — PX: IR ANGIOGRAM VISCERAL SELECTIVE: IMG657

## 2021-09-06 HISTORY — PX: ENTEROSCOPY: SHX5533

## 2021-09-06 LAB — CBC
HCT: 22.8 % — ABNORMAL LOW (ref 39.0–52.0)
HCT: 15.7 % — ABNORMAL LOW (ref 39.0–52.0)
Hemoglobin: 7.6 g/dL — ABNORMAL LOW (ref 13.0–17.0)
Hemoglobin: 5.6 g/dL — CL (ref 13.0–17.0)
MCH: 29.2 pg (ref 26.0–34.0)
MCH: 31.5 pg (ref 26.0–34.0)
MCHC: 33.3 g/dL (ref 30.0–36.0)
MCHC: 35.7 g/dL (ref 30.0–36.0)
MCV: 87.7 fL (ref 80.0–100.0)
MCV: 88.2 fL (ref 80.0–100.0)
Platelets: 183 10*3/uL (ref 150–400)
Platelets: 151 10*3/uL (ref 150–400)
RBC: 2.6 MIL/uL — ABNORMAL LOW (ref 4.22–5.81)
RBC: 1.78 MIL/uL — ABNORMAL LOW (ref 4.22–5.81)
RDW: 13.2 % (ref 11.5–15.5)
RDW: 13.9 % (ref 11.5–15.5)
WBC: 12.8 10*3/uL — ABNORMAL HIGH (ref 4.0–10.5)
WBC: 29.1 10*3/uL — ABNORMAL HIGH (ref 4.0–10.5)
nRBC: 0 % (ref 0.0–0.2)
nRBC: 0.1 % (ref 0.0–0.2)

## 2021-09-06 LAB — COMPREHENSIVE METABOLIC PANEL
ALT: 16 U/L (ref 0–44)
AST: 21 U/L (ref 15–41)
Albumin: 1.8 g/dL — ABNORMAL LOW (ref 3.5–5.0)
Alkaline Phosphatase: 21 U/L — ABNORMAL LOW (ref 38–126)
Anion gap: 8 (ref 5–15)
BUN: 24 mg/dL — ABNORMAL HIGH (ref 6–20)
CO2: 19 mmol/L — ABNORMAL LOW (ref 22–32)
Calcium: 6.7 mg/dL — ABNORMAL LOW (ref 8.9–10.3)
Chloride: 110 mmol/L (ref 98–111)
Creatinine, Ser: 1.77 mg/dL — ABNORMAL HIGH (ref 0.61–1.24)
GFR, Estimated: 44 mL/min — ABNORMAL LOW (ref >60)
Glucose, Bld: 338 mg/dL — ABNORMAL HIGH (ref 70–99)
Potassium: 5.1 mmol/L (ref 3.5–5.1)
Sodium: 137 mmol/L (ref 135–145)
Total Bilirubin: 0.4 mg/dL (ref 0.3–1.2)
Total Protein: 3.7 g/dL — ABNORMAL LOW (ref 6.5–8.1)

## 2021-09-06 LAB — PROTIME-INR
INR: 1.6 — ABNORMAL HIGH (ref 0.8–1.2)
Prothrombin Time: 19 seconds — ABNORMAL HIGH (ref 11.4–15.2)

## 2021-09-06 LAB — GLUCOSE, CAPILLARY
Glucose-Capillary: 203 mg/dL — ABNORMAL HIGH (ref 70–99)
Glucose-Capillary: 216 mg/dL — ABNORMAL HIGH (ref 70–99)
Glucose-Capillary: 256 mg/dL — ABNORMAL HIGH (ref 70–99)
Glucose-Capillary: 296 mg/dL — ABNORMAL HIGH (ref 70–99)
Glucose-Capillary: 327 mg/dL — ABNORMAL HIGH (ref 70–99)

## 2021-09-06 LAB — PREPARE RBC (CROSSMATCH)

## 2021-09-06 LAB — MAGNESIUM: Magnesium: 1.4 mg/dL — ABNORMAL LOW (ref 1.7–2.4)

## 2021-09-06 SURGERY — ESOPHAGOGASTRODUODENOSCOPY (EGD) WITH PROPOFOL
Anesthesia: Monitor Anesthesia Care

## 2021-09-06 SURGERY — COLONOSCOPY WITH PROPOFOL
Anesthesia: General

## 2021-09-06 MED ORDER — EPHEDRINE SULFATE (PRESSORS) 50 MG/ML IJ SOLN
INTRAMUSCULAR | Status: DC | PRN
  Administered 2021-09-06 (×2): 10 mg via INTRAVENOUS

## 2021-09-06 MED ORDER — METOPROLOL TARTRATE 5 MG/5ML IV SOLN
INTRAVENOUS | Status: AC
  Filled 2021-09-06: qty 5

## 2021-09-06 MED ORDER — FENTANYL CITRATE (PF) 100 MCG/2ML IJ SOLN
INTRAMUSCULAR | Status: AC | PRN
  Administered 2021-09-06 (×3): 25 ug via INTRAVENOUS

## 2021-09-06 MED ORDER — METOPROLOL TARTRATE 5 MG/5ML IV SOLN
2.5000 mg | INTRAVENOUS | Status: DC | PRN
  Administered 2021-09-06: 2.5 mg via INTRAVENOUS

## 2021-09-06 MED ORDER — PHENYLEPHRINE HCL (PRESSORS) 10 MG/ML IV SOLN
INTRAVENOUS | Status: DC | PRN
  Administered 2021-09-06 (×8): 160 ug via INTRAVENOUS

## 2021-09-06 MED ORDER — SODIUM CHLORIDE 0.9% IV SOLUTION: Freq: Once | INTRAVENOUS | Status: AC

## 2021-09-06 MED ORDER — DIPHENHYDRAMINE HCL 50 MG/ML IJ SOLN
INTRAMUSCULAR | Status: AC | PRN
  Administered 2021-09-06: 50 mg via INTRAVENOUS

## 2021-09-06 MED ORDER — LACTATED RINGERS IV SOLN: INTRAVENOUS | Status: DC | PRN

## 2021-09-06 MED ORDER — LIDOCAINE HCL 1 % IJ SOLN
INTRAMUSCULAR | Status: AC | PRN
  Administered 2021-09-06: 10 mL

## 2021-09-06 MED ORDER — GLUCAGON HCL RDNA (DIAGNOSTIC) 1 MG IJ SOLR
INTRAMUSCULAR | Status: DC | PRN
  Administered 2021-09-06: .5 mg via INTRAVENOUS

## 2021-09-06 MED ORDER — CHLORHEXIDINE GLUCONATE CLOTH 2 % EX PADS
6.0000 | MEDICATED_PAD | Freq: Every day | CUTANEOUS | Status: DC
  Administered 2021-09-06 – 2021-10-30 (×62): 6 via TOPICAL

## 2021-09-06 MED ORDER — FENTANYL CITRATE (PF) 100 MCG/2ML IJ SOLN
INTRAMUSCULAR | Status: AC
  Filled 2021-09-06: qty 2

## 2021-09-06 MED ORDER — PANTOPRAZOLE 80MG IVPB - SIMPLE MED
80.0000 mg | Freq: Once | INTRAVENOUS | Status: AC
  Administered 2021-09-06: 80 mg via INTRAVENOUS
  Filled 2021-09-06: qty 80

## 2021-09-06 MED ORDER — ONDANSETRON HCL 4 MG/2ML IJ SOLN
INTRAMUSCULAR | Status: AC | PRN
  Administered 2021-09-06: 4 mg via INTRAVENOUS

## 2021-09-06 MED ORDER — IOHEXOL 300 MG/ML  SOLN
100.0000 mL | Freq: Once | INTRAMUSCULAR | Status: AC | PRN
  Administered 2021-09-06: 25 mL via INTRA_ARTERIAL

## 2021-09-06 MED ORDER — SPOT INK MARKER SYRINGE KIT
PACK | SUBMUCOSAL | Status: AC
  Filled 2021-09-06: qty 5

## 2021-09-06 MED ORDER — SODIUM CHLORIDE 0.9% IV SOLUTION
Freq: Once | INTRAVENOUS | Status: AC
  Administered 2021-09-07: 10 mL via INTRAVENOUS

## 2021-09-06 MED ORDER — DIPHENHYDRAMINE HCL 50 MG/ML IJ SOLN
INTRAMUSCULAR | Status: AC
  Filled 2021-09-06: qty 1

## 2021-09-06 MED ORDER — LORAZEPAM 2 MG/ML IJ SOLN
1.0000 mg | Freq: Once | INTRAMUSCULAR | Status: AC
  Administered 2021-09-06: 1 mg via INTRAVENOUS

## 2021-09-06 MED ORDER — PANTOPRAZOLE INFUSION (NEW) - SIMPLE MED
8.0000 mg/h | INTRAVENOUS | Status: DC
  Administered 2021-09-06 – 2021-09-07 (×3): 8 mg/h via INTRAVENOUS
  Filled 2021-09-06: qty 100
  Filled 2021-09-06 (×2): qty 80
  Filled 2021-09-06: qty 100

## 2021-09-06 MED ORDER — INSULIN ASPART 100 UNIT/ML IJ SOLN
0.0000 [IU] | INTRAMUSCULAR | Status: DC
  Administered 2021-09-06: 8 [IU] via SUBCUTANEOUS
  Administered 2021-09-06: 11 [IU] via SUBCUTANEOUS
  Administered 2021-09-07: 8 [IU] via SUBCUTANEOUS
  Administered 2021-09-07: 15 [IU] via SUBCUTANEOUS
  Administered 2021-09-07: 11 [IU] via SUBCUTANEOUS
  Administered 2021-09-07: 5 [IU] via SUBCUTANEOUS
  Administered 2021-09-07: 11 [IU] via SUBCUTANEOUS
  Administered 2021-09-07: 8 [IU] via SUBCUTANEOUS
  Administered 2021-09-08 (×2): 3 [IU] via SUBCUTANEOUS

## 2021-09-06 MED ORDER — IOHEXOL 300 MG/ML  SOLN
100.0000 mL | Freq: Once | INTRAMUSCULAR | Status: AC | PRN
  Administered 2021-09-06: 50 mL via INTRA_ARTERIAL

## 2021-09-06 MED ORDER — MIDAZOLAM HCL 2 MG/2ML IJ SOLN
INTRAMUSCULAR | Status: AC | PRN
  Administered 2021-09-06 (×3): .5 mg via INTRAVENOUS

## 2021-09-06 MED ORDER — MIDAZOLAM HCL 2 MG/2ML IJ SOLN
INTRAMUSCULAR | Status: AC
  Filled 2021-09-06: qty 2

## 2021-09-06 MED ORDER — PHENYLEPHRINE HCL-NACL 20-0.9 MG/250ML-% IV SOLN
0.0000 ug/min | INTRAVENOUS | Status: DC
  Administered 2021-09-07: 370 ug/min via INTRAVENOUS
  Administered 2021-09-07: 400 ug/min via INTRAVENOUS
  Administered 2021-09-07: 60 ug/min via INTRAVENOUS
  Administered 2021-09-07: 370 ug/min via INTRAVENOUS
  Filled 2021-09-06 (×2): qty 250
  Filled 2021-09-06: qty 500

## 2021-09-06 MED ORDER — LORAZEPAM 2 MG/ML IJ SOLN
INTRAMUSCULAR | Status: AC
  Filled 2021-09-06: qty 1

## 2021-09-06 MED ORDER — LACTATED RINGERS IV BOLUS
1000.0000 mL | Freq: Once | INTRAVENOUS | Status: AC
  Administered 2021-09-06: 1000 mL via INTRAVENOUS

## 2021-09-06 MED ORDER — GLUCAGON HCL RDNA (DIAGNOSTIC) 1 MG IJ SOLR
INTRAMUSCULAR | Status: AC
  Filled 2021-09-06: qty 1

## 2021-09-06 MED ORDER — PANTOPRAZOLE SODIUM 40 MG IV SOLR
40.0000 mg | Freq: Two times a day (BID) | INTRAVENOUS | Status: DC
  Filled 2021-09-06: qty 10

## 2021-09-06 MED ORDER — LIDOCAINE HCL 1 % IJ SOLN
INTRAMUSCULAR | Status: AC
  Filled 2021-09-06: qty 20

## 2021-09-06 MED ORDER — PROPOFOL 10 MG/ML IV BOLUS
INTRAVENOUS | Status: DC | PRN
  Administered 2021-09-06 (×2): 200 mg via INTRAVENOUS
  Administered 2021-09-06: 100 mg via INTRAVENOUS
  Administered 2021-09-06: 200 mg via INTRAVENOUS

## 2021-09-06 MED ORDER — IOHEXOL 350 MG/ML SOLN
100.0000 mL | Freq: Once | INTRAVENOUS | Status: AC | PRN
  Administered 2021-09-06: 100 mL via INTRAVENOUS

## 2021-09-06 MED ORDER — ONDANSETRON HCL 4 MG/2ML IJ SOLN
INTRAMUSCULAR | Status: AC
  Filled 2021-09-06: qty 2

## 2021-09-06 NOTE — Op Note (Signed)
[facility] Patient Name: Troy Randall Procedure Date: 09/06/2021 9:20 AM MRN: [id number] Date of Birth: 02/01/1960 Attending MD: [name] ,  CSN: [id number] Age: 61 Admit Type: Inpatient Procedure:                Colonoscopy Indications:              Hematochezia Providers:                [name], Lurline Del, RN, Casimer Bilis, Technician Referring MD:              Medicines:                Monitored Anesthesia Care Complications:            No immediate complications. Estimated Blood Loss:     Estimated blood loss: none. Procedure:                Pre-Anesthesia Assessment:                           - Prior to the procedure, a History and Physical                            was performed, and patient medications, allergies                            and sensitivities were reviewed. The patient's                            tolerance of previous anesthesia was reviewed.                           - The risks and benefits of the procedure and the                            sedation options and risks were discussed with the                            patient. All questions were answered and informed                            consent was obtained.                           - ASA Grade Assessment: III - A patient with severe                            systemic disease.                           After obtaining informed consent, the colonoscope                            was passed under direct vision. Throughout the  procedure, the patient's blood pressure, pulse, and                            oxygen saturations were monitored continuously. The                            [id number]-[id number] ([id number]) scope was introduced through                            the anus and advanced to the the terminal ileum.                            The colonoscopy was technically difficult and                            complex due  to excessive bleeding. The patient                            tolerated the procedure well. The quality of the                            bowel preparation was unsatisfactory - large amount                            of old blood. Scope In: 10:20:19 AM Scope Out: 10:46:37 AM Scope Withdrawal Time: 0 hours 19 minutes 6 seconds  Total Procedure Duration: 0 hours 26 minutes 17 seconds  Findings:      The perianal and digital rectal examinations were normal.      The distal ileum contained hematin (altered blood/coffee-ground-like       material) and maroon contents.      Hematin (altered blood/coffee-ground-like material), maroon contents and       large amount of clots were found in the entire colon. No active bleeding       was found. Inspection was limited by the large amount of blood. Impression:               - Preparation of the colon was unsatisfactory.                           - Blood in the distal ileum.                           - Blood in the entire examined colon.                           - No specimens collected. Moderate Sedation:      Per Anesthesia Care Recommendation:           - NPO.                           - Proceed with EGD, possible enteroscopy and                            possible capsule deployment. Procedure Code(s):        ---  Professional ---                           [phone], Colonoscopy, flexible; diagnostic, including                            collection of specimen(s) by brushing or washing,                            when performed (separate procedure) Diagnosis Code(s):        --- Professional ---                           K92.2, Gastrointestinal hemorrhage, unspecified                           K92.1, Melena (includes Hematochezia) CPT copyright 2019 American Medical Association. All rights reserved. The codes documented in this report are preliminary and upon coder review may  be revised to meet current compliance requirements. Katrinka Blazing,  MD Katrinka Blazing,  09/06/2021 11:37:26 AM This report has been signed electronically. Number of Addenda: 0

## 2021-09-06 NOTE — Op Note (Addendum)
[facility] Patient Name: Troy Randall Procedure Date: 09/06/2021 11:44 AM MRN: [id number] Date of Birth: November 29, 1960 Attending MD: Katrinka Blazing ,  CSN: [id number] Age: 61 Admit Type: Inpatient Procedure:                Small bowel enteroscopy Indications:              Hematochezia Providers:                Katrinka Blazing, Nena Polio, RN, Cyril Mourning, Technician Referring MD:              Medicines:                Monitored Anesthesia Care Complications:            No immediate complications. Estimated Blood Loss:     Estimated blood loss was minimal. Procedure:                Pre-Anesthesia Assessment:                           - Prior to the procedure, a History and Physical                            was performed, and patient medications, allergies                            and sensitivities were reviewed. The patient's                            tolerance of previous anesthesia was reviewed.                           - The risks and benefits of the procedure and the                            sedation options and risks were discussed with the                            patient. All questions were answered and informed                            consent was obtained.                           - ASA Grade Assessment: III - A patient with severe                            systemic disease.                           After obtaining informed consent, the endoscope was                            passed under direct vision. Throughout the  procedure, the patient's blood pressure, pulse, and                            oxygen saturations were monitored continuously. The                            [id number] scope was introduced through                            the mouth and advanced to the proximal jejunum. The                            small bowel enteroscopy was technically difficult                             and complex due to significant looping. The patient                            tolerated the procedure well. Scope In: 11:05:51 AM Scope Out: 11:25:17 AM Total Procedure Duration: 0 hours 19 minutes 26 seconds  Findings:      A small hiatal hernia was present.      Multiple large erosions with no stigmata of recent bleeding were found       in the gastric body.      The examined duodenum was normal.      There was presence of small amount of clots in the proximal jejunum with       evidence of an area that had intermittent blood pumping in the proximal       jejunum (unclear if this was a Dieulafoy for lesion vs jejunal       diverticular bleeding). I attempted multiple times to reach this area       but due to significant looping despite using glucagon, I could not reach       again this area. Area was successfully injected with 1 mL Spot (carbon       black) for tattooing. For location marking, one hemostatic clip was       successfully placed. There was no bleeding at the end of the procedure.      Two diverticula were found in the proximal jejunum.      Note: pictures from enteroscopy are attached to the upper endoscopy       report Impression:               - Small hiatal hernia.                           - Erosive gastropathy with no stigmata of recent                            bleeding.                           - Normal examined duodenum.                           - Jejunal blood. Injected. Clip was placed.                           -  Non-bleeding jejunal diverticula.                           - No specimens collected. Moderate Sedation:      Per Anesthesia Care Recommendation:           - Transfer patient to ICU.                           - NPO.                           - Check H/H every 6 hours                           - Continue pantoprazole ggt.                           - Transfuse 2 UPRBC now                           - Follow capsule endoscopy results.                            - If active bleeding is found, proceed with                            evaluation by interventional radiology STAT, may                            repeat CT angio bleed. If not amenable for                            treatment, will need to be transferred to [facility] for                            Seth Ward.                           - If presence of a mass explaining bleeding, will                            need to reach on call general surgeon. Procedure Code(s):        --- Professional ---                           [phone], Small intestinal endoscopy, enteroscopy                            beyond second portion of duodenum, not including                            ileum; diagnostic, including collection of                            specimen(s) by brushing or washing, when performed                            (  separate procedure)                           [phone], Unlisted procedure, small intestine Diagnosis Code(s):        --- Professional ---                           K44.9, Diaphragmatic hernia without obstruction or                            gangrene                           K31.89, Other diseases of stomach and duodenum                           K92.2, Gastrointestinal hemorrhage, unspecified                           K92.1, Melena (includes Hematochezia)                           K57.10, Diverticulosis of small intestine without                            perforation or abscess without bleeding CPT copyright 2019 American Medical Association. All rights reserved. The codes documented in this report are preliminary and upon coder review may  be revised to meet current compliance requirements. Katrinka Blazing, MD Katrinka Blazing,  09/06/2021 12:01:21 PM This report has been signed electronically. Number of Addenda: 0

## 2021-09-06 NOTE — Anesthesia Preprocedure Evaluation (Signed)
Anesthesia Evaluation  Patient identified by MRN, date of birth, ID band Patient awake    Reviewed: Allergy & Precautions, H&P , NPO status , Patient's Chart, lab work & pertinent test results, reviewed documented beta blocker date and time   Airway Mallampati: II  TM Distance: >3 FB Neck ROM: full    Dental no notable dental hx.    Pulmonary neg pulmonary ROS,    Pulmonary exam normal breath sounds clear to auscultation       Cardiovascular Exercise Tolerance: Good hypertension, negative cardio ROS   Rhythm:regular Rate:Normal     Neuro/Psych  Neuromuscular disease CVA negative psych ROS   GI/Hepatic negative GI ROS, Neg liver ROS,   Endo/Other  negative endocrine ROSdiabetes, Poorly Controlled, Type 2  Renal/GU Renal disease  negative genitourinary   Musculoskeletal   Abdominal   Peds  Hematology  (+) Blood dyscrasia, anemia ,   Anesthesia Other Findings   Reproductive/Obstetrics negative OB ROS                             Anesthesia Physical Anesthesia Plan  ASA: 4 and emergent  Anesthesia Plan: General   Post-op Pain Management:    Induction:   PONV Risk Score and Plan: Propofol infusion  Airway Management Planned:   Additional Equipment:   Intra-op Plan:   Post-operative Plan:   Informed Consent: I have reviewed the patients History and Physical, chart, labs and discussed the procedure including the risks, benefits and alternatives for the proposed anesthesia with the patient or authorized representative who has indicated his/her understanding and acceptance.     Dental Advisory Given  Plan Discussed with: CRNA  Anesthesia Plan Comments:         Anesthesia Quick Evaluation

## 2021-09-06 NOTE — H&P (Signed)
NAME:  Troy Randall, MRN:  [id number], DOB:  06-12-1960, LOS: 2 ADMISSION DATE:  09/04/2021, CONSULTATION DATE:  09/06/21 REFERRING MD:  [name], CHIEF COMPLAINT:  melena   History of Present Illness:   Troy Randall is an 61 y.o. male with prior history of HTN, HLD, DM, and CVA on ASA/ plavix who presented to [facility] on 8/31 after developing frequent 6-7 dark stools and feeling dizzy and lightheaded on standing.  No hx of NSAIDs.  FOBT positive and initial Hgb 12.2.    Patient admitted to [facility] with GI consulting.  Remained orthostatic with progressive decline in Hgb.  CTA abdomen did not show any active bleed, therefore underwent colonoscopy > no active bleed found but large clots throughout entire colon.  Then underwent EGD which showed a small hiatal hernia, erosive gastropathy with no stigmata of recent bleeding, and non-bleeding duodenal diverticulum.   Underwent then small bowel enteroscopy which noted intermittent blood pumping in the proximal jejunum but was unable to be reached despite multiple attempts, was injected and clip placed.  Repeat CTA repeated, which showed positive jejunal diverticular bleeding. Patient was transferred to [facility] for IR involvement. In IR active extravasation  at SMA territory, jejunal arcade branch, successful coil embolization.  PCCM was consulted for transfer and admission.  Pertinent  Medical History  HTN, HLD, DM, CVA on ASA/ plavix  Significant Hospital Events: Including procedures, antibiotic start and stop dates in addition to other pertinent events   8/31 presented to [facility], [name] Admit, Gi Consult 9/1 CTA> no evidence of active GI bleed, 2 U PRBC 9/2 2 U PRBC, colonoscopy > no active bleed found but large clots throughout entire colon. EGD> small hiatal hernia, erosive gastropathy with no stigmata of recent bleeding, and non-bleeding duodenal diverticulum.   Underwent then small bowel enteroscopy which noted intermittent blood pumping in the proximal jejunum  but was unable to be reached despite multiple attempts, was injected and clip placed. Repeat CTA repeated, which showed positive jejunal diverticular bleeding. IR> active extrav at [facility] territory, jejunal arcade branch, successful coil embo  Interim History / Subjective:  See above  Unable to obtain subjective exam, patient sedated post IR  Objective   Blood pressure 123/72, pulse (!) 109, temperature 99.7 F (37.6 C), temperature source Axillary, resp. rate (!) 25, height 6' (1.829 m), weight (!) 139.3 kg, SpO2 100 %. On 2LNC        Intake/Output Summary (Last 24 hours) at 09/06/2021 2045 Last data filed at 09/06/2021 2015 Gross per 24 hour  Intake 1684.93 ml  Output 425 ml  Net 1259.93 ml   Filed Weights   09/04/21 1222  Weight: (!) 139.3 kg    Examination: General: In bed, NAD, appears comfortable HEENT: MM pink/moist, anicteric, atraumatic Neuro: RASS -2, PERRL 5mm, opens eyes to voice, thumbs up to command CV: S1S2, ST, no m/r/g appreciated PULM:  air movement in all lobes, trachea midline, chest expansion symmetric GI: obese, rounded, bsx4 hypoactive, non-tender   Extremities: warm/dry, no pretibial edema, capillary refill less than 3 seconds  Skin:  no rashes or lesions noted  Labs/imaging WBC 8.5 > 10.4 > 12.8 Hemoglobin 8.5 > 8.0 > 8.8 > 7.6 Labs from 9/1 reviewed CT angio GI bleed 9/2: per rads Active arterial phase extravasation identified in a small bowel loop of the left upper quadrant, likely jejunal.  Cholelithiasis.  Bilateral nonobstructing nephrolithiasis.  Duodenal and jejunal diverticuli.  4 mm pulmonary nodules in bilateral bases.  Periumbilical anterior  abdominal wall hernia containing only fat status post ventral mesh placement. Chest x-ray: Mildly elevated right hemidiaphragm, no significant effusion.  No pneumothorax, no infiltrate.  Resolved Hospital Problem list     Assessment & Plan:  Acute lower GIB s/p IR, active bleeding of SMA territory,  jejunal arcade branch, with coil embolization Acute blood loss anemia, secondary to above Duodenal and jejunal diverticuli as seen on 9/2 CT Cholelithiasis as seen on 9/2 CT Postsurgical changes along the anterior abdominal wall with complex ventral hernia containing fat as seen on 9/2 CT -IR and GI consulted. Appreciate assistance. Post procedure care per IR recs -Admit to ICU -Continue Protonix gtt -NPO -Transfuse PRBC if HBG less than 7 -Trend HGB, check coags, repeat T&S -Monitor for signs of bleeding -Monitor abdominal exam  Leukocytosis WBC 8.5 > 10.4 > 12.8. Suspect reactive. Tmax 99.4. Patient not initally with leukocytosis. CXR clear. -Monitor fever WBC curve.  History of CVA -Continue holding Plavix.  Appreciate GI input on timing of resumption of aspirin. -monitor neuro exam per unit protocol.  DM2 BG 203-296 -Blood Glucose goal 140-180. -SSI  HTN HLD -Hold home antihypertensives and statin  Bilateral nonobstructing nephrolithiasis BPH -Continue Flomax when able to take PO -Monitor UOP  Pulmonary nodules in bilateral bases, L 39mm Family reports patient is a never smoker. Family does endorse dipping. -repeat chest ct in 12 months per RADS recs   Best Practice (right click and "Reselect all SmartList Selections" daily)   Diet/type: NPO DVT prophylaxis: SCD GI prophylaxis: PPI Lines: N/A Foley:  Yes, and it is still needed Code Status:  full code Last date of multidisciplinary goals of care discussion [Full scope- Discussed with wife [name] 9/2 ]  Labs   CBC: Recent Labs  Lab 09/04/21 1238 09/05/21 0008 09/05/21 0307 09/05/21 1112 09/05/21 2000 09/06/21 0426  WBC 11.8*  --  8.5 8.5 10.4 12.8*  HGB 12.2* 9.2* 8.5* 8.0* 8.8* 7.6*  HCT 37.0* 27.3* 25.2* 23.4* 26.1* 22.8*  MCV 87.9  --  88.4 87.3 87.3 87.7  PLT 209  --  184 161 172 183    Basic Metabolic Panel: Recent Labs  Lab 09/04/21 1238 09/05/21 0307  NA 137 138  K 4.9 4.5  CL 106  111  CO2 24 24  GLUCOSE 193* 166*  BUN 28* 25*  CREATININE 1.23 1.00  CALCIUM 8.7* 7.6*   GFR: Estimated Creatinine Clearance: 116.5 mL/min (by C-G formula based on SCr of 1 mg/dL). Recent Labs  Lab 09/05/21 0307 09/05/21 1112 09/05/21 2000 09/06/21 0426  WBC 8.5 8.5 10.4 12.8*    Liver Function Tests: Recent Labs  Lab 09/04/21 1238  AST 15  ALT 17  ALKPHOS 35*  BILITOT 0.8  PROT 6.9  ALBUMIN 3.7   No results for input(s): "LIPASE", "AMYLASE" in the last 168 hours. No results for input(s): "AMMONIA" in the last 168 hours.  ABG No results found for: "PHART", "PCO2ART", "PO2ART", "HCO3", "TCO2", "ACIDBASEDEF", "O2SAT"   Coagulation Profile: Recent Labs  Lab 09/05/21 0307  INR 1.2    Cardiac Enzymes: No results for input(s): "CKTOTAL", "CKMB", "CKMBINDEX", "TROPONINI" in the last 168 hours.  HbA1C: Hgb A1c MFr Bld  Date/Time Value Ref Range Status  04/29/2021 05:02 AM 6.2 (H) 4.8 - 5.6 % Final    Comment:    (NOTE) Pre diabetes:          5.7%-6.4%  Diabetes:              >6.4%  Glycemic  control for   <7.0% adults with diabetes   03/31/2017 04:25 PM 9.5 (H) <5.7 % of total Hgb Final    Comment:    For someone without known diabetes, a hemoglobin A1c value of 6.5% or greater indicates that they may have  diabetes and this should be confirmed with a follow-up  test. . For someone with known diabetes, a value <7% indicates  that their diabetes is well controlled and a value  greater than or equal to 7% indicates suboptimal  control. A1c targets should be individualized based on  duration of diabetes, age, comorbid conditions, and  other considerations. . Currently, no consensus exists regarding use of hemoglobin A1c for diagnosis of diabetes for children. .     CBG: Recent Labs  Lab 09/05/21 2038 09/06/21 0757 09/06/21 1009 09/06/21 1220 09/06/21 1959  GLUCAP 206* 203* 216* 256* 296*    Review of Systems:   Unable to obtain ROS due to  patient status  Past Medical History:  He,  has a past medical history of Anemia, Arthritis, CVA (cerebral vascular accident) ([place]), Diabetes mellitus without complication ([place]), Hyperlipidemia, Hypertension, and Kidney stones.   Surgical History:   Past Surgical History:  Procedure Laterality Date   ANKLE SURGERY     HERNIA REPAIR     umbilical x1 Incisional x1   INCISIONAL HERNIA REPAIR  04/13/2011   Procedure: LAPAROSCOPIC INCISIONAL HERNIA;  Surgeon: Jamesetta So, MD;  Location: [facility];  Service: General;  Laterality: N/A;  Recurrent Laparoscopic Incisional Herniorraphy with Mesh   KIDNEY STONE SURGERY       Social History:   reports that he has never smoked. His smokeless tobacco use includes snuff. He reports that he does not drink alcohol and does not use drugs.   Family History:  His family history includes Cancer in an other family member; Diabetes in his father; Heart attack in an other family member; Heart failure in his mother. There is no history of Anesthesia problems, Hypotension, Malignant hyperthermia, Pseudochol deficiency, Colon cancer, or Inflammatory bowel disease.   Allergies Allergies  Allergen Reactions   Metformin And Related     GI upset   Penicillins Hives     Home Medications  Prior to Admission medications   Medication Sig Start Date End Date Taking? Authorizing Provider  amLODipine (NORVASC) 10 MG tablet Take 1 tablet (10 mg total) by mouth daily. 06/15/17  Yes Caren Macadam, MD  aspirin EC 81 MG tablet Take 81 mg by mouth daily. Swallow whole.   Yes [provider]  clopidogrel (PLAVIX) 75 MG tablet Take 1 tablet (75 mg total) by mouth daily. 04/30/21  Yes Little Ishikawa, MD  glipiZIDE (GLUCOTROL XL) 10 MG 24 hr tablet Take 1 tablet (10 mg total) by mouth daily with breakfast. 04/16/17  Yes Hagler, Apolonio Schneiders, MD  levocetirizine (XYZAL) 5 MG tablet Take 1 tablet by mouth at bedtime.   Yes [provider]  lisinopril  (PRINIVIL,ZESTRIL) 40 MG tablet Take 1 tablet (40 mg total) by mouth daily. 05/12/17  Yes Caren Macadam, MD  metFORMIN (GLUCOPHAGE-XR) 500 MG 24 hr tablet Take 500-1,000 mg by mouth in the morning and at bedtime. 02/09/21  Yes [provider]  rosuvastatin (CRESTOR) 40 MG tablet Take 1 tablet (40 mg total) by mouth daily. 04/29/21  Yes Little Ishikawa, MD  tamsulosin (FLOMAX) 0.4 MG CAPS capsule Take 0.4 mg by mouth daily. 02/12/21  Yes [provider]  traZODone (DESYREL) 150 MG tablet Take  150 mg by mouth at bedtime. 02/04/21  Yes [provider]  TRESIBA FLEXTOUCH 200 UNIT/ML FlexTouch Pen Inject 20 Units into the skin in the morning and at bedtime. 03/14/21  Yes [provider]  BD VEO INSULIN SYRINGE U/F 31G X 15/64" 1 ML MISC  USE AS DIRECTED 06/08/17   Caren Macadam, MD  glucose blood (ONETOUCH VERIO) test strip TEST twice a day 05/04/17   Caren Macadam, MD  Insulin Pen Needle (NOVOTWIST) 32G X 5 MM MISC Use two daily to inject Victoza and Toujeo. 04/25/15   Elayne Snare, MD  [facility] DELICA LANCETS [id number] MISC Use to check blood sugar once a day dx code E11.65 11/21/14   Elayne Snare, MD     Critical care time: 40 minutes     The patient is critically ill with multiple organ systems failure and requires high complexity decision making for assessment and support, frequent evaluation and titration of therapies, application of advanced monitoring technologies and extensive interpretation of multiple databases.    Critical Care Time devoted to patient care services described in this note is 40 minutes. This time reflects time of care of this [place] NP. This critical care time does not reflect procedure time but could involve care discussion time with the PCCM attending.  Redmond School., MSN, APRN, AGACNP-BC [facility]  09/06/2021 , 8:45 PM  Please see [email or web address] for pager details  If no response, please call  [phone] After hours, please call Elink at [phone]

## 2021-09-06 NOTE — Op Note (Signed)
[facility] Patient Name: Troy Randall Procedure Date: 09/06/2021 10:51 AM MRN: [id number] Date of Birth: [birth year] Attending MD: Katrinka Blazing ,  CSN: [id number] Age: 61 Admit Type: Inpatient Procedure:                Upper GI endoscopy Indications:              Hematochezia Providers:                Katrinka Blazing, Nena Polio, RN Referring MD:              Medicines:                Monitored Anesthesia Care Complications:            No immediate complications. Estimated Blood Loss:     Estimated blood loss: none. Procedure:                Pre-Anesthesia Assessment:                           - Prior to the procedure, a History and Physical                            was performed, and patient medications, allergies                            and sensitivities were reviewed. The patient's                            tolerance of previous anesthesia was reviewed.                           - The risks and benefits of the procedure and the                            sedation options and risks were discussed with the                            patient. All questions were answered and informed                            consent was obtained.                           - ASA Grade Assessment: III - A patient with severe                            systemic disease.                           After obtaining informed consent, the endoscope was                            passed under direct vision. Throughout the                            procedure, the patient's blood pressure, pulse, and  oxygen saturations were monitored continuously. The                            [id number]-H190 ([id number]) scope was introduced through the                            mouth, and advanced to the second part of duodenum.                            The upper GI endoscopy was accomplished without                            difficulty. The patient tolerated the procedure                             well. Scope In: 10:55:10 AM Scope Out: 11:36:15 AM Total Procedure Duration: 0 hours 41 minutes 5 seconds  Findings:      A small hiatal hernia was present.      Multiple large erosions with no stigmata of recent bleeding were found       in the gastric body.      A medium non-bleeding diverticulum was found in the second portion of       the duodenum.      The exam of the duodenum was otherwise normal.      Note: Endoscopic capsule was successfully deployed in the duodenal bulb       after enteroscopy was performed. This was performed with the upper scope.      - Pictures of enteroscopy are attached to the upper endoscopy report. Impression:               - Small hiatal hernia.                           - Erosive gastropathy with no stigmata of recent                            bleeding.                           - Non-bleeding duodenal diverticulum.                           - No specimens collected. Moderate Sedation:      Per Anesthesia Care Recommendation:           - Transfer patient to ICU.                           - NPO.                           - Proceed with enteroscopy. Procedure Code(s):        --- Professional ---                           [phone], Esophagogastroduodenoscopy, flexible,  transoral; diagnostic, including collection of                            specimen(s) by brushing or washing, when performed                            (separate procedure) Diagnosis Code(s):        --- Professional ---                           K44.9, Diaphragmatic hernia without obstruction or                            gangrene                           K31.89, Other diseases of stomach and duodenum                           K92.1, Melena (includes Hematochezia)                           K57.10, Diverticulosis of small intestine without                            perforation or abscess without bleeding CPT copyright 2019 American Medical  Association. All rights reserved. The codes documented in this report are preliminary and upon coder review may  be revised to meet current compliance requirements. [name], MD [name],  09/06/2021 11:52:10 AM This report has been signed electronically. Number of Addenda: 0

## 2021-09-06 NOTE — Progress Notes (Signed)
CT angio positive for jejunal diverticulitis bleeding Discussed with IR, will transfer for emergent embolization

## 2021-09-06 NOTE — Progress Notes (Signed)
eLink Physician-Brief Progress Note Patient Name: Troy Randall DOB: March 02, 1960 MRN: [id number]   Date of Service  09/06/2021  HPI/Events of Note  89M with hx stroke on ASA and Plavix, DM2, HTN, HLD who presented with melena admitted for LGIB s/p coil embolization  S/p 2L IVF  PRBC x 4U this admission  eICU Interventions  Discontinued Plavix Hold aspirin Trend CBC PPI gtt     Intervention Category Evaluation Type: New Patient Evaluation  Trice Aspinall Mechele Collin 09/06/2021, 8:16 PM

## 2021-09-06 NOTE — Progress Notes (Signed)
PROGRESS NOTE    Troy Randall  LNL:[id number] DOB: [birth year] DOA: 09/04/2021 PCP: Benita Stabile, MD    Brief Narrative:  Troy Randall is a 61 y.o. male with medical history significant of hypertension, hyperlipidemia, diabetes, prior stroke on aspirin and Plavix, who was in his usual state of health when this morning he developed frequent dark stools.  Denies any bright red blood per rectum.  No nausea or vomiting.  No abdominal pain.  He was feeling dizzy and lightheaded on standing.  Felt that "his breathing was not right".  Denied any chest pain.  Reports having 6-7 dark bowel movements today.  1 of those bowel movements with after he arrived to the hospital.  No history of NSAID use.   Assessment & Plan:   Principal Problem:   GI bleed Active Problems:   Uncontrolled type 2 diabetes mellitus with hyperglycemia, without long-term current use of insulin (HCC)   Essential hypertension   Hyperlipidemia   GI bleeding -He has had continued dark red stools -He did not have any abdominal pain -No history of NSAIDs -GI following -Colonoscopy showed blood throughout entire colon.  Endoscopy indicated possible surgical source of bleeding -CTA abdomen confirms likely jejunal source of bleeding -Case reviewed with interventional radiology we will plan on angiography and embolization -Patient to be transferred to [facility], ICU for further treatment -Reviewed case with PCCM  Acute blood loss anemia secondary to GI bleeding -Hemoglobin 12.2 on admission -Down to 8.0 on 9/1, he received 2 units of PRBC -Follow-up hemoglobin noted to be 7.6 and evidence of continued bleeding on colonoscopy -Another 2 units of PRBC have been ordered today. -Continue serial hemoglobin checks and transfuse as needed    Prior history of stroke -Has been on aspirin and Plavix since 04/2021 -Neurology note during that admission had suggested dual antiplatelet therapy for 21 days followed by monotherapy  with aspirin -No prior history of cardiac disease that patient is aware of -We will discontinue Plavix -Resume aspirin when felt reasonable per GI   Diabetes -Hold metformin and glipizide -Holding basal insulin while n.p.o. -Follow on sliding scale   Hypertension -Holding antihypertensives for now since he was orthostatic -Resume as blood pressure will allow   Hyperlipidemia -Resume statin once bleeding issues have resolved   BPH -Holding tamsulosin until orthostatics stabilize   Obesity, class III -BMI 41.6 -Needs lifestyle changes   DVT prophylaxis: Place and maintain sequential compression device Start: 09/04/21 1914  Code Status: Full code Family Communication: Discussed with wife and son at the bedside Disposition Plan: Status is: Inpatient Remains inpatient appropriate because: Continued inpatient work-up for GI bleeding     Consultants:  Gastroenterology  Procedures:    Antimicrobials:      Subjective: Patient is seen in PACU after colonoscopy.  He is somnolent, but does wake up to voice.  Says that he is sore in his abdomen.  He has not had any vomiting.  Objective: Vitals:   09/06/21 1530 09/06/21 1545 09/06/21 1557 09/06/21 1559  BP: (!) 124/52 (!) 128/46 (!) 120/46   Pulse: (!) 134 (!) 142  (!) 145  Resp: (!) 35 (!) 30  (!) 27  Temp:   99.4 F (37.4 C) 99.4 F (37.4 C)  TempSrc:   Axillary Axillary  SpO2: 98% 100%  97%  Weight:      Height:        Intake/Output Summary (Last 24 hours) at 09/06/2021 1620 Last data filed at 09/06/2021 1557 Gross  per 24 hour  Intake 3309.32 ml  Output --  Net 3309.32 ml   Filed Weights   09/04/21 1222  Weight: (!) 139.3 kg    Examination:  General exam: Appears calm and comfortable  Respiratory system: Clear to auscultation. Respiratory effort normal. Cardiovascular system: S1 & S2 heard, RRR. No JVD, murmurs, rubs, gallops or clicks. No pedal edema. Gastrointestinal system: Abdomen is obese, soft  and nontender. No organomegaly or masses felt. Normal bowel sounds heard. Central nervous system: No focal neurological deficits. Extremities: Symmetric 5 x 5 power. Skin: No rashes, lesions or ulcers. Pale    Data Reviewed: I have personally reviewed following labs and imaging studies  CBC: Recent Labs  Lab 09/04/21 1238 09/05/21 0008 09/05/21 0307 09/05/21 1112 09/05/21 2000 09/06/21 0426  WBC 11.8*  --  8.5 8.5 10.4 12.8*  HGB 12.2* 9.2* 8.5* 8.0* 8.8* 7.6*  HCT 37.0* 27.3* 25.2* 23.4* 26.1* 22.8*  MCV 87.9  --  88.4 87.3 87.3 87.7  PLT 209  --  184 161 172 183   Basic Metabolic Panel: Recent Labs  Lab 09/04/21 1238 09/05/21 0307  NA 137 138  K 4.9 4.5  CL 106 111  CO2 24 24  GLUCOSE 193* 166*  BUN 28* 25*  CREATININE 1.23 1.00  CALCIUM 8.7* 7.6*   GFR: Estimated Creatinine Clearance: 116.5 mL/min (by C-G formula based on SCr of 1 mg/dL). Liver Function Tests: Recent Labs  Lab 09/04/21 1238  AST 15  ALT 17  ALKPHOS 35*  BILITOT 0.8  PROT 6.9  ALBUMIN 3.7   No results for input(s): "LIPASE", "AMYLASE" in the last 168 hours. No results for input(s): "AMMONIA" in the last 168 hours. Coagulation Profile: Recent Labs  Lab 09/05/21 0307  INR 1.2   Cardiac Enzymes: No results for input(s): "CKTOTAL", "CKMB", "CKMBINDEX", "TROPONINI" in the last 168 hours. BNP (last 3 results) No results for input(s): "PROBNP" in the last 8760 hours. HbA1C: No results for input(s): "HGBA1C" in the last 72 hours. CBG: Recent Labs  Lab 09/05/21 1601 09/05/21 2038 09/06/21 0757 09/06/21 1009 09/06/21 1220  GLUCAP 166* 206* 203* 216* 256*   Lipid Profile: No results for input(s): "CHOL", "HDL", "LDLCALC", "TRIG", "CHOLHDL", "LDLDIRECT" in the last 72 hours. Thyroid Function Tests: No results for input(s): "TSH", "T4TOTAL", "FREET4", "T3FREE", "THYROIDAB" in the last 72 hours. Anemia Panel: No results for input(s): "VITAMINB12", "FOLATE", "FERRITIN", "TIBC",  "IRON", "RETICCTPCT" in the last 72 hours. Sepsis Labs: No results for input(s): "PROCALCITON", "LATICACIDVEN" in the last 168 hours.  No results found for this or any previous visit (from the past 240 hour(s)).       Radiology Studies: CT ANGIO GI BLEED  Addendum Date: 09/06/2021   ADDENDUM REPORT: 09/06/2021 15:13 ADDENDUM: Critical Value/emergent results were called by telephone at the time of interpretation on 09/06/2021 at 3:12 pm to provider [facility] , who verbally acknowledged these results. Electronically Signed   By: Kennith Center M.D.   On: 09/06/2021 15:13   Result Date: 09/06/2021 CLINICAL DATA:  Rectal bleeding. EXAM: CTA ABDOMEN AND PELVIS WITHOUT AND WITH CONTRAST TECHNIQUE: Multidetector CT imaging of the abdomen and pelvis was performed using the standard protocol during bolus administration of intravenous contrast. Multiplanar reconstructed images and MIPs were obtained and reviewed to evaluate the vascular anatomy. RADIATION DOSE REDUCTION: This exam was performed according to the departmental dose-optimization program which includes automated exposure control, adjustment of the mA and/or kV according to patient size and/or use of iterative  reconstruction technique. CONTRAST:  171mL OMNIPAQUE IOHEXOL 350 MG/ML SOLN COMPARISON:  09/05/2021 FINDINGS: VASCULAR Aorta: Normal caliber aorta without aneurysm, dissection, vasculitis or significant stenosis. Mild atherosclerotic calcification noted. Celiac: Patent without evidence of aneurysm, dissection, vasculitis or significant stenosis. Mass-effect on the celiac origin likely secondary to compression by the median arcuate ligament. SMA: Patent without evidence of aneurysm, dissection, vasculitis or significant stenosis. Replaced right hepatic artery. Renals: Both renal arteries are patent without evidence of aneurysm, dissection, vasculitis, fibromuscular dysplasia or significant stenosis. Accessory left renal artery noted. 2  accessory renal arteries noted right kidney, 1 of which arises from the right common iliac artery. IMA: Patent without evidence of aneurysm, dissection, vasculitis or significant stenosis. Inflow: Patent without evidence of aneurysm, dissection, vasculitis or significant stenosis. Proximal Outflow: Bilateral common femoral and visualized portions of the superficial and profunda femoral arteries are patent without evidence of aneurysm, dissection, vasculitis or significant stenosis. Veins: No obvious venous abnormality within the limitations of this arterial phase study. Review of the MIP images confirms the above findings. NON-VASCULAR Lower chest: Tiny pulmonary nodules again identified measuring up to 4 mm. Hepatobiliary: No suspicious focal abnormality within the liver parenchyma. Small calcified gallstones evident. No intrahepatic or extrahepatic biliary dilation. Pancreas: No focal mass lesion. No dilatation of the main duct. No intraparenchymal cyst. No peripancreatic edema. Spleen: No splenomegaly. No focal mass lesion. Adrenals/Urinary Tract: No adrenal nodule or mass. Tiny nonobstructing stones are noted in both kidneys. Small cyst noted lower interpolar right kidney. Left kidney unremarkable. No evidence for hydroureter. The urinary bladder appears normal for the degree of distention. Stomach/Bowel: Stomach is unremarkable. No gastric wall thickening. No evidence of outlet obstruction. Duodenum is normally positioned as is the ligament of Treitz. Duodenal diverticuli noted. No small bowel wall thickening. No small bowel dilatation. Active arterial phase bleeding is identified in in a small bowel loop of the left abdomen. Bleeding appears to arise in a diverticulum (axial image 96 series 9) with layering of extravasated contrast dependently within the bowel lumen. Apparent diverticulum also well demonstrated on coronal image 64/11 with additional small bowel diverticuli seen in the same region (also coronal  64/11). There is a capsule endoscope in the proximal duodenum on precontrast imaging but has migrated into the proximal jejunum by postcontrast imaging. The terminal ileum is normal. The appendix is normal. No gross colonic mass. No colonic wall thickening. Lymphatic: There is no gastrohepatic or hepatoduodenal ligament lymphadenopathy. No retroperitoneal or mesenteric lymphadenopathy. No pelvic sidewall lymphadenopathy. Reproductive: The prostate gland and seminal vesicles are unremarkable. Other: No intraperitoneal free fluid. Musculoskeletal: No worrisome lytic or sclerotic osseous abnormality. Ventral mesh evident. There is a paraumbilical ventral hernia containing only fat. IMPRESSION: VASCULAR 1. Active arterial phase extravasation identified in a small bowel loop of the left upper quadrant, likely jejunal. The source of the bleeding appears to be a diverticulum and multiple additional diverticuli are seen in the duodenum and proximal small bowel. 2. Abdominal aortic atherosclerosis without aneurysm. NON-VASCULAR 1. Capsule endoscope identified in the duodenum on precontrast imaging migrates into proximal small bowel by postcontrast series. 2. Cholelithiasis. 3. Bilateral nonobstructing nephrolithiasis. 4. Duodenal and jejunal diverticuli. 5. Tiny pulmonary nodules again noted in both lung bases measuring up to 4 mm. No follow-up needed if patient is low-risk (and has no known or suspected primary neoplasm). Non-contrast chest CT can be considered in 12 months if patient is high-risk. This recommendation follows the consensus statement: Guidelines for Management of Incidental Pulmonary Nodules Detected on CT  Images: From the Fleischner Society 2017; Radiology 2017; 123XX123. 6. Paraumbilical anterior abdominal wall hernia containing only fat in this patient status post ventral mesh placement. Hernia may be along the inferior margin of the existing mash. Electronically Signed: By: Misty Stanley M.D. On:  09/06/2021 15:06   CT Angio Abd/Pel w/ and/or w/o  Result Date: 09/05/2021 CLINICAL DATA:  61 year old with rectal bleeding. EXAM: CTA ABDOMEN AND PELVIS WITHOUT AND WITH CONTRAST TECHNIQUE: Multidetector CT imaging of the abdomen and pelvis was performed using the standard protocol during bolus administration of intravenous contrast. Multiplanar reconstructed images and MIPs were obtained and reviewed to evaluate the vascular anatomy. RADIATION DOSE REDUCTION: This exam was performed according to the departmental dose-optimization program which includes automated exposure control, adjustment of the mA and/or kV according to patient size and/or use of iterative reconstruction technique. CONTRAST:  175mL OMNIPAQUE IOHEXOL 350 MG/ML SOLN COMPARISON:  None Available. FINDINGS: VASCULAR Aorta: Normal caliber aorta without aneurysm, dissection, vasculitis or significant stenosis. Celiac: Patent without evidence of aneurysm, dissection, vasculitis or significant stenosis. Mild narrowing in the proximal celiac artery is likely related to median arcuate ligament compression. SMA: Replaced right hepatic artery which is a normal variant. SMA is widely patent without aneurysm or dissection. Renals: Bilateral renal arteries are patent without evidence of aneurysm, dissection, vasculitis, fibromuscular dysplasia or significant stenosis. Single accessory left renal artery and 2 small accessory right renal arteries. Most inferior right accessory renal artery originates from the proximal right common iliac artery. IMA: Patent without evidence of aneurysm, dissection, vasculitis or significant stenosis. Inflow: Patent without evidence of aneurysm, dissection, vasculitis or significant stenosis. Proximal Outflow: Proximal femoral arteries are patent bilaterally. Veins: Portal venous system is patent. IVC is patent. There is a retroaortic left renal vein which is a normal variant. IVC and iliac veins are patent. Review of the MIP  images confirms the above findings. NON-VASCULAR Lower chest: Several small nodules at the both lung bases. Largest nodule measures 4 mm in the left lower lobe on sequence 6 image 37. Coronary artery calcifications. Hepatobiliary: Layering gallstones. Mild distention of the gallbladder without surrounding inflammatory changes. Normal appearance of the liver without intrahepatic or extrahepatic biliary dilatation. Pancreas: Unremarkable. No pancreatic ductal dilatation or surrounding inflammatory changes. Spleen: Normal in size without focal abnormality. Adrenals/Urinary Tract: Normal adrenals bilaterally. 3 mm stone in the right kidney interpolar region. Probably 3 small stones in the left kidney, largest is in the left interpolar region measuring 3 mm. Negative for hydronephrosis. No suspicious renal lesions. 1.2 cm low-density structure in the right kidney interpolar region is most compatible with a cyst and does not require dedicated follow-up. Normal appearance of the urinary bladder. Stomach/Bowel: Duodenal diverticula near the pancreatic head. Another diverticulum in the distal duodenum. Diverticulum involving the small bowel on sequence 13, image 88 measuring up to 1.7 cm. Additional small bowel diverticulum on sequence 13 image 111 measuring up to 2.1 cm. No evidence for bowel dilatation or focal bowel inflammation. No evidence for active GI bleeding. Normal appendix without inflammatory changes. Lymphatic: No lymph node enlargement in the abdomen or pelvis. Reproductive: Prostate is unremarkable. Other: There is a small complex periumbilical ventral hernia containing fat. Surgical mesh along the anterior abdominal wall. Negative for ascites. Negative for free air. Musculoskeletal: No acute bone abnormality. IMPRESSION: VASCULAR 1. No evidence for active GI bleeding. 2. No significant atherosclerotic disease in the abdomen or pelvis. 3. Coronary artery calcifications. 4. Main visceral arteries are patent  with variant anatomy. Replaced  right hepatic artery and bilateral accessory renal arteries. NON-VASCULAR 1. No acute abnormality in the abdomen or pelvis. 2. Bilateral nonobstructive nephrolithiasis. 3. Cholelithiasis. 4. Multiple pulmonary nodules. Most severe: 4 mm left solid pulmonary nodule. Per Fleischner Society Guidelines, no routine follow-up imaging is recommended. These guidelines do not apply to immunocompromised patients and patients with cancer. Follow up in patients with significant comorbidities as clinically warranted. For lung cancer screening, adhere to Lung-RADS guidelines. Reference: Radiology. 2017; 284(1):228-43. 5. Postsurgical changes along the anterior abdominal wall with complex ventral hernia containing fat. 6. Duodenal and jejunal diverticula. Electronically Signed   By: Markus Daft M.D.   On: 09/05/2021 12:47        Scheduled Meds:  sodium chloride   Intravenous Once   sodium chloride   Intravenous Once   Chlorhexidine Gluconate Cloth  6 each Topical Daily   insulin aspart  0-15 Units Subcutaneous Q4H   [START ON 09/10/2021] pantoprazole  40 mg Intravenous Q12H   Continuous Infusions:  sodium chloride 100 mL/hr at 09/05/21 1803   pantoprazole 8 mg/hr (09/06/21 1524)     LOS: 2 days    Critical care procedure note Authorized and performed by: Kathie Dike Total critical care time: Approximately 45 minutes Due to high probability of clinically significant, life-threatening deterioration, the patient required my highest level of preparedness to intervene emergently and I personally spent this critical care time directly and personally managing the patient.  The critical care time included obtaining a history, examining the patient, pulse oximetry, ordering and review of studies, arranging urgent treatment with development of a management plan, evaluation of patient's response to treatment, frequent reassessment, discussions with other providers.  Critical care time  was performed to assess and manage the high probability of imminent, life-threatening deterioration that could result in multiorgan failure.  It was exclusive of separate billable procedures and treating other patients and teaching time.  Please see MDM section and the rest of the of note for further information on patient assessment and treatment     Kathie Dike, MD [facility]   If 7PM-7AM, please contact night-coverage [email or web address]  09/06/2021, 4:20 PM

## 2021-09-06 NOTE — Progress Notes (Signed)
Patient came from [facility] and Dr. Jarrett Ables were give blood emergently and have CT performed. CT completed, retrieved 1st unit of blood. Dr. Kerry Hough stated to rapidly infuse blood which I did. Second unit was started and hanging at time of transport and 15 minute check completed. Second IV placed in left forearm 20 gauge. 16 gauge also placed in left arm at request of Dr. Kerry Hough and [facility] doctor. Due to pain in abdomen and patient had not urinated, got order for foley due to critical nature of patient. Care link arrived for transport and carried paperwork need to be given to [facility] nurses and they indicated they would give to them. Called nurse on 31M and was transferred to another nurse and RN took report stating if she did not get patient she would make sure who did would get report.

## 2021-09-06 NOTE — Transfer of Care (Addendum)
Immediate Anesthesia Transfer of Care Note  Patient: Troy Randall  Procedure(s) Performed: COLONOSCOPY WITH PROPOFOL ESOPHAGOGASTRODUODENOSCOPY (EGD) WITH PROPOFOL ENTEROSCOPY  Patient Location: PACU  Anesthesia Type:General  Level of Consciousness: drowsy  Airway & Oxygen Therapy: Patient Spontanous Breathing and Patient connected to nasal cannula oxygen  Post-op Assessment: Report given to RN and Post -op Vital signs reviewed and unstable, Anesthesiologist notified  Post vital signs: Reviewed  Last Vitals:  Vitals Value Taken Time  BP 82/69   Temp 98   Pulse 99 09/06/21 1146  Resp 28   SpO2 91 % 09/06/21 1146  Vitals shown include unvalidated device data.  Last Pain:  Vitals:   09/06/21 0544  TempSrc:   PainSc: 3          Complications: No notable events documented.

## 2021-09-06 NOTE — Progress Notes (Signed)
We will proceed with colonoscopy and possible EGD with possible capsule deployment as scheduled (patient has presented significant drop in Hb).  I thoroughly discussed with the patient his procedure, including the risks involved. Patient understands what the procedure involves including the benefits and any risks. Patient understands alternatives to the proposed procedure. Risks including (but not limited to) bleeding, tearing of the lining (perforation), rupture of adjacent organs, problems with heart and lung function, infection, and medication reactions. A small percentage of complications may require surgery, hospitalization, repeat endoscopic procedure, and/or transfusion.  Patient understood and agreed.  Katrinka Blazing, MD Gastroenterology and Hepatology [facility]

## 2021-09-06 NOTE — Progress Notes (Signed)
eLink Physician-Brief Progress Note Patient Name: Troy Randall DOB: 01/19/1960 MRN: [id number]   Date of Service  09/06/2021  HPI/Events of Note  Hg 5.6  eICU Interventions  Transfuse PRBC x 2     Intervention Category Intermediate Interventions: Bleeding - evaluation and treatment with blood products  Troy Randall 09/06/2021, 11:11 PM

## 2021-09-06 NOTE — Anesthesia Postprocedure Evaluation (Signed)
Anesthesia Post Note  Patient: JEFF FRIEDEN  Procedure(s) Performed: COLONOSCOPY WITH PROPOFOL ESOPHAGOGASTRODUODENOSCOPY (EGD) WITH PROPOFOL ENTEROSCOPY  Patient location during evaluation: PACU Anesthesia Type: General Level of consciousness: awake and alert Pain management: pain level controlled Vital Signs Assessment: post-procedure vital signs reviewed and stable Respiratory status: spontaneous breathing, nonlabored ventilation, respiratory function stable and patient connected to nasal cannula oxygen Cardiovascular status: blood pressure returned to baseline and stable Postop Assessment: no apparent nausea or vomiting Anesthetic complications: no   No notable events documented.   Last Vitals:  Vitals:   09/06/21 1200 09/06/21 1215  BP: 122/72 (!) 114/59  Pulse: 88 (!) 103  Resp: (!) 33 19  Temp:    SpO2: 99% 98%    Last Pain:  Vitals:   09/06/21 1215  TempSrc:   PainSc: 0-No pain                 Windell Norfolk

## 2021-09-06 NOTE — Progress Notes (Signed)
Spoke with IR Will order repeat CT angio to evaluate bleeding source.

## 2021-09-06 NOTE — Procedures (Signed)
Vascular and Interventional Radiology Procedure Note  Patient: Troy Randall DOB: 07-21-1960 Medical Record Number: [id number] Note Date/Time: 09/06/21 5:54 PM   Performing Physician: Roanna Banning, MD Assistant(s): None  Diagnosis: UGIB with active extravasation on CTA.  Procedure:  MESENTERIC ARTERIOGRAPHY COIL EMBOLIZATION of SMA TERRITORY, JEJUNAL ARCADE BRANCH  Anesthesia: Conscious Sedation Complications: None Estimated Blood Loss: Minimal Specimens: None  Findings:  - access via the RIGHT femoral artery. - active extravasation at SMA territory, jejunal arcade branch - successfully treated with superselective coil embolization. - AngioSeal closure at the R groin with distal RLE pulses at the end of the case.   Plan: - Post sheath removal precautions.  - Bedrest with RLE straight x2hrs.  Final report to follow once all images are reviewed and compared with previous studies.  See detailed dictation with images in PACS. The patient tolerated the procedure well without incident or complication and was returned to ICU in stable condition.    Roanna Banning, MD [facility] Radiology   Pager. [phone] Clinic. [phone]

## 2021-09-06 NOTE — Brief Op Note (Signed)
09/04/2021 - 09/06/2021  10:03 AM  PATIENT:  Troy Randall  61 y.o. male  PRE-OPERATIVE DIAGNOSIS:  rectal bleeding, acute blood loss anemia  POST-OPERATIVE DIAGNOSIS:  * No post-op diagnosis entered *  PROCEDURE:  Procedure(s): COLONOSCOPY WITH PROPOFOL (N/A)  SURGEON:  Surgeon(s) and Role:    * Dolores Frame, MD - Primary  Patient underwent colonoscopy, EGD and push enteroscopy with capsule deployment via esophagogastroduodenospy under propofol sedation.  Tolerated the procedure adequately.   I started the case with the colonoscopy part due to concern for rectal bleeding. The distal ileum contained hematin (altered blood/coffee-ground-like material) and  maroon contents. Hematin (altered blood/coffee-ground-like material), maroon contents and large amount of clots were found in the entire colon. No active bleeding was found. Inspection was limited by the large amount of blood.  Due to these findings, I decided to proceed with an EGD to rule out a rapid upper GI bleeding.   Esophagus showed small hiatal hernia.  Stomach showed some gastric erosions.  Duodenum only showed presence of duodenal diverticula, otherwise within normal limits.  As there was no presence of any attributable cause of large bleeding, I proceeded with a push enteroscopy at this point.  Put enteroscopy showed presence of normal third and fourth portion of the duodenum.  There was presence of a couple of jejunal diverticula.  There was presence of small amount of clots in the proximal jejunum with evidence of an area that had intermittent blood pumping in the proximal jejunum (unclear if this was a Dieulafoy for lesion vs jejunal diverticular bleeding).  I attempted multiple times to reach this area but due to significant looping despite using glucagon, I could not reach again this area.  Just proximal to the area of bleeding, I deployed a hemostatic clip to mark the area, this area was also tattooed with spot 1  cc.  Finally, a capsule enteroscopy was deployed via esophagogastroduodenospy.  I had to reinsert the upper scope to deploy this capsule in the duodenum.  RECOMMENDATIONS - Transfer patient to ICU.  - NPO.  - Check H/H every 6 hours - Transfuse 2 UPRBC now - Continue pantoprazole ggt. - Follow capsule endoscopy results. - If active bleeding is found, proceed with evaluation by interventional radiology STAT, may repeat CT angio bleed. If not amenable for treatment, will need to be transferred to [facility] for [facility]. - If presence of a mass explaining bleeding, will need to reach on call general surgeon.  Katrinka Blazing, MD Gastroenterology and Hepatology [facility]

## 2021-09-06 NOTE — Consult Note (Signed)
Vascular and Interventional Radiology  PRE PROCEDURE H&P  Assessment  Plan:   [name] is a 61 y.o. year old male who will undergo MESENTERIC ARTERIOGRAM, POSSIBLE EMBOLIZATION in Interventional Radiology.  The procedure has been fully reviewed with the patient/patient's authorized representative. The risks, benefits and alternatives have been explained, and the patient/patient's authorized representative has consented to the procedure. -- The patient will accept blood products in an emergent situation. -- The patient does not have a Do Not Resuscitate order in effect.  HPI: [name] is a 61 y.o. year old male previously on DAPT with acute GIB noted on endoscopy and on CTA. VIR consulted for evaluation and management. Pt urgently transferred from [facility] to [facility] for intervention. Informed consent was obtained, witnessed and placed in the patient's chart.  Imaging independently reviewed, demonstrating active extravasation at jejunum.    Allergies:  Allergies  Allergen Reactions   Metformin And Related     GI upset   Penicillins Hives    Medications:  No current facility-administered medications on file prior to encounter.   Current Outpatient Medications on File Prior to Encounter  Medication Sig Dispense Refill   amLODipine (NORVASC) 10 MG tablet Take 1 tablet (10 mg total) by mouth daily. 90 tablet 3   aspirin EC 81 MG tablet Take 81 mg by mouth daily. Swallow whole.     clopidogrel (PLAVIX) 75 MG tablet Take 1 tablet (75 mg total) by mouth daily. 30 tablet 0   glipiZIDE (GLUCOTROL XL) 10 MG 24 hr tablet Take 1 tablet (10 mg total) by mouth daily with breakfast. 90 tablet 1   levocetirizine (XYZAL) 5 MG tablet Take 1 tablet by mouth at bedtime.     lisinopril (PRINIVIL,ZESTRIL) 40 MG tablet Take 1 tablet (40 mg total) by mouth daily. 90 tablet 3   metFORMIN (GLUCOPHAGE-XR) 500 MG 24 hr tablet Take 500-1,000 mg by mouth in the morning and at bedtime.      rosuvastatin (CRESTOR) 40 MG tablet Take 1 tablet (40 mg total) by mouth daily. 30 tablet 1   tamsulosin (FLOMAX) 0.4 MG CAPS capsule Take 0.4 mg by mouth daily.     traZODone (DESYREL) 150 MG tablet Take 150 mg by mouth at bedtime.     TRESIBA FLEXTOUCH 200 UNIT/ML FlexTouch Pen Inject 20 Units into the skin in the morning and at bedtime.     BD VEO INSULIN SYRINGE U/F 31G X 15/64" 1 ML MISC  USE AS DIRECTED 100 each 2   glucose blood (ONETOUCH VERIO) test strip TEST twice a day 100 each 3   Insulin Pen Needle (NOVOTWIST) 32G X 5 MM MISC Use two daily to inject Victoza and Toujeo. 90 each 5   ONETOUCH DELICA LANCETS 33G MISC Use to check blood sugar once a day dx code E11.65 50 each 3    PSH:  Past Surgical History:  Procedure Laterality Date   ANKLE SURGERY     HERNIA REPAIR     umbilical x1 Incisional x1   INCISIONAL HERNIA REPAIR  04/13/2011   Procedure: LAPAROSCOPIC INCISIONAL HERNIA;  Surgeon: Dalia Heading, MD;  Location: [facility];  Service: General;  Laterality: N/A;  Recurrent Laparoscopic Incisional Herniorraphy with Mesh   KIDNEY STONE SURGERY      PMH:  Past Medical History:  Diagnosis Date   Anemia    Arthritis    CVA (cerebral vascular accident) (HCC)    Diabetes mellitus without complication (HCC)  Hyperlipidemia    Hypertension    Kidney stones     Brief Physical Examination: Vitals:   09/06/21 1557 09/06/21 1559  BP: (!) 120/46   Pulse:  (!) 145  Resp:  (!) 27  Temp: 99.4 F (37.4 C) 99.4 F (37.4 C)  SpO2:  97%   General: WD male confused, pale and hypovolemic in appearance  HEENT: Normocephalic, atraumatic Lungs: Respirations mildly labored  ASA Grade: 3 Mallampati Class: 3   Roanna Banning, MD Vascular and Interventional Radiology [facility] Radiology   Pager. [phone] Clinic. [phone]

## 2021-09-06 NOTE — Progress Notes (Signed)
I was reached by Dr. Kerry Hough today regarding patient feeling lightheaded and looking pale.  We discussed that it is reasonable to reach interventional radiology at this moment to have some input regarding the possibility of performing angiography.  I paged Dr. Milford Cage (IR attending on call) to discuss the case further.

## 2021-09-07 ENCOUNTER — Inpatient Hospital Stay (HOSPITAL_COMMUNITY): Payer: Medicare HMO

## 2021-09-07 DIAGNOSIS — Z4682 Encounter for fitting and adjustment of non-vascular catheter: Secondary | ICD-10-CM | POA: Diagnosis not present

## 2021-09-07 DIAGNOSIS — K5791 Diverticulosis of intestine, part unspecified, without perforation or abscess with bleeding: Secondary | ICD-10-CM

## 2021-09-07 DIAGNOSIS — A419 Sepsis, unspecified organism: Secondary | ICD-10-CM | POA: Diagnosis not present

## 2021-09-07 DIAGNOSIS — J9601 Acute respiratory failure with hypoxia: Secondary | ICD-10-CM | POA: Diagnosis not present

## 2021-09-07 DIAGNOSIS — N281 Cyst of kidney, acquired: Secondary | ICD-10-CM | POA: Diagnosis not present

## 2021-09-07 DIAGNOSIS — K8020 Calculus of gallbladder without cholecystitis without obstruction: Secondary | ICD-10-CM | POA: Diagnosis not present

## 2021-09-07 DIAGNOSIS — N179 Acute kidney failure, unspecified: Secondary | ICD-10-CM | POA: Diagnosis not present

## 2021-09-07 DIAGNOSIS — I10 Essential (primary) hypertension: Secondary | ICD-10-CM | POA: Diagnosis not present

## 2021-09-07 DIAGNOSIS — N200 Calculus of kidney: Secondary | ICD-10-CM | POA: Diagnosis not present

## 2021-09-07 DIAGNOSIS — R579 Shock, unspecified: Secondary | ICD-10-CM | POA: Diagnosis not present

## 2021-09-07 DIAGNOSIS — K810 Acute cholecystitis: Secondary | ICD-10-CM | POA: Diagnosis not present

## 2021-09-07 DIAGNOSIS — J9811 Atelectasis: Secondary | ICD-10-CM | POA: Diagnosis not present

## 2021-09-07 DIAGNOSIS — K921 Melena: Secondary | ICD-10-CM | POA: Diagnosis not present

## 2021-09-07 DIAGNOSIS — K625 Hemorrhage of anus and rectum: Secondary | ICD-10-CM

## 2021-09-07 DIAGNOSIS — Z9889 Other specified postprocedural states: Secondary | ICD-10-CM | POA: Diagnosis not present

## 2021-09-07 DIAGNOSIS — R6521 Severe sepsis with septic shock: Secondary | ICD-10-CM

## 2021-09-07 DIAGNOSIS — R578 Other shock: Secondary | ICD-10-CM | POA: Diagnosis not present

## 2021-09-07 DIAGNOSIS — E1165 Type 2 diabetes mellitus with hyperglycemia: Secondary | ICD-10-CM | POA: Diagnosis not present

## 2021-09-07 DIAGNOSIS — D62 Acute posthemorrhagic anemia: Secondary | ICD-10-CM | POA: Diagnosis not present

## 2021-09-07 DIAGNOSIS — R0902 Hypoxemia: Secondary | ICD-10-CM | POA: Diagnosis not present

## 2021-09-07 DIAGNOSIS — K922 Gastrointestinal hemorrhage, unspecified: Secondary | ICD-10-CM | POA: Diagnosis not present

## 2021-09-07 HISTORY — PX: IR US GUIDE BX ASP/DRAIN: IMG2392

## 2021-09-07 HISTORY — PX: IR GUIDED DRAIN W CATHETER PLACEMENT: IMG719

## 2021-09-07 HISTORY — PX: IR US GUIDE VASC ACCESS RIGHT: IMG2390

## 2021-09-07 HISTORY — PX: IR ANGIOGRAM VISCERAL SELECTIVE: IMG657

## 2021-09-07 LAB — TYPE AND SCREEN
ABO/RH(D): A NEG
Antibody Screen: NEGATIVE
Unit division: 0
Unit division: 0
Unit division: 0
Unit division: 0

## 2021-09-07 LAB — POCT I-STAT 7, (LYTES, BLD GAS, ICA,H+H)
Acid-base deficit: 4 mmol/L — ABNORMAL HIGH (ref 0.0–2.0)
Acid-base deficit: 11 mmol/L — ABNORMAL HIGH (ref 0.0–2.0)
Acid-base deficit: 7 mmol/L — ABNORMAL HIGH (ref 0.0–2.0)
Bicarbonate: 21.9 mmol/L (ref 20.0–28.0)
Bicarbonate: 17.3 mmol/L — ABNORMAL LOW (ref 20.0–28.0)
Bicarbonate: 18.6 mmol/L — ABNORMAL LOW (ref 20.0–28.0)
Calcium, Ion: 1.06 mmol/L — ABNORMAL LOW (ref 1.15–1.40)
Calcium, Ion: 1.07 mmol/L — ABNORMAL LOW (ref 1.15–1.40)
Calcium, Ion: 1.02 mmol/L — ABNORMAL LOW (ref 1.15–1.40)
HCT: 19 % — ABNORMAL LOW (ref 39.0–52.0)
HCT: 22 % — ABNORMAL LOW (ref 39.0–52.0)
HCT: 18 % — ABNORMAL LOW (ref 39.0–52.0)
Hemoglobin: 6.5 g/dL — CL (ref 13.0–17.0)
Hemoglobin: 7.5 g/dL — ABNORMAL LOW (ref 13.0–17.0)
Hemoglobin: 6.1 g/dL — CL (ref 13.0–17.0)
O2 Saturation: 100 %
O2 Saturation: 92 %
O2 Saturation: 94 %
Patient temperature: 38
Patient temperature: 38.2
Potassium: 5.2 mmol/L — ABNORMAL HIGH (ref 3.5–5.1)
Potassium: 5.3 mmol/L — ABNORMAL HIGH (ref 3.5–5.1)
Potassium: 4.6 mmol/L (ref 3.5–5.1)
Sodium: 134 mmol/L — ABNORMAL LOW (ref 135–145)
Sodium: 135 mmol/L (ref 135–145)
Sodium: 136 mmol/L (ref 135–145)
TCO2: 23 mmol/L (ref 22–32)
TCO2: 19 mmol/L — ABNORMAL LOW (ref 22–32)
TCO2: 20 mmol/L — ABNORMAL LOW (ref 22–32)
pCO2 arterial: 42.6 mmHg (ref 32–48)
pCO2 arterial: 50.7 mmHg — ABNORMAL HIGH (ref 32–48)
pCO2 arterial: 39.1 mmHg (ref 32–48)
pH, Arterial: 7.318 — ABNORMAL LOW (ref 7.35–7.45)
pH, Arterial: 7.147 — CL (ref 7.35–7.45)
pH, Arterial: 7.291 — ABNORMAL LOW (ref 7.35–7.45)
pO2, Arterial: 376 mmHg — ABNORMAL HIGH (ref 83–108)
pO2, Arterial: 88 mmHg (ref 83–108)
pO2, Arterial: 84 mmHg (ref 83–108)

## 2021-09-07 LAB — CBC
HCT: 21.5 % — ABNORMAL LOW (ref 39.0–52.0)
HCT: 25.2 % — ABNORMAL LOW (ref 39.0–52.0)
HCT: 20.2 % — ABNORMAL LOW (ref 39.0–52.0)
Hemoglobin: 7.6 g/dL — ABNORMAL LOW (ref 13.0–17.0)
Hemoglobin: 8.6 g/dL — ABNORMAL LOW (ref 13.0–17.0)
Hemoglobin: 7.2 g/dL — ABNORMAL LOW (ref 13.0–17.0)
MCH: 31.4 pg (ref 26.0–34.0)
MCH: 31 pg (ref 26.0–34.0)
MCH: 31.4 pg (ref 26.0–34.0)
MCHC: 35.3 g/dL (ref 30.0–36.0)
MCHC: 34.1 g/dL (ref 30.0–36.0)
MCHC: 35.6 g/dL (ref 30.0–36.0)
MCV: 88.8 fL (ref 80.0–100.0)
MCV: 91 fL (ref 80.0–100.0)
MCV: 88.2 fL (ref 80.0–100.0)
Platelets: 125 10*3/uL — ABNORMAL LOW (ref 150–400)
Platelets: 154 10*3/uL (ref 150–400)
Platelets: 110 10*3/uL — ABNORMAL LOW (ref 150–400)
RBC: 2.42 MIL/uL — ABNORMAL LOW (ref 4.22–5.81)
RBC: 2.77 MIL/uL — ABNORMAL LOW (ref 4.22–5.81)
RBC: 2.29 MIL/uL — ABNORMAL LOW (ref 4.22–5.81)
RDW: 13.9 % (ref 11.5–15.5)
RDW: 14.5 % (ref 11.5–15.5)
RDW: 14.6 % (ref 11.5–15.5)
WBC: 29.1 10*3/uL — ABNORMAL HIGH (ref 4.0–10.5)
WBC: 4.1 10*3/uL (ref 4.0–10.5)
WBC: 6.2 10*3/uL (ref 4.0–10.5)
nRBC: 0.2 % (ref 0.0–0.2)
nRBC: 26.3 % — ABNORMAL HIGH (ref 0.0–0.2)
nRBC: 10.9 % — ABNORMAL HIGH (ref 0.0–0.2)

## 2021-09-07 LAB — LACTIC ACID, PLASMA
Lactic Acid, Venous: 6.9 mmol/L (ref 0.5–1.9)
Lactic Acid, Venous: 3.1 mmol/L (ref 0.5–1.9)
Lactic Acid, Venous: 7.8 mmol/L (ref 0.5–1.9)
Lactic Acid, Venous: 5.2 mmol/L (ref 0.5–1.9)
Lactic Acid, Venous: 4.6 mmol/L (ref 0.5–1.9)

## 2021-09-07 LAB — GLUCOSE, CAPILLARY
Glucose-Capillary: 333 mg/dL — ABNORMAL HIGH (ref 70–99)
Glucose-Capillary: 260 mg/dL — ABNORMAL HIGH (ref 70–99)
Glucose-Capillary: 258 mg/dL — ABNORMAL HIGH (ref 70–99)
Glucose-Capillary: 405 mg/dL — ABNORMAL HIGH (ref 70–99)
Glucose-Capillary: 312 mg/dL — ABNORMAL HIGH (ref 70–99)
Glucose-Capillary: 253 mg/dL — ABNORMAL HIGH (ref 70–99)
Glucose-Capillary: 216 mg/dL — ABNORMAL HIGH (ref 70–99)

## 2021-09-07 LAB — BASIC METABOLIC PANEL
Anion gap: 9 (ref 5–15)
BUN: 30 mg/dL — ABNORMAL HIGH (ref 6–20)
CO2: 18 mmol/L — ABNORMAL LOW (ref 22–32)
Calcium: 6.9 mg/dL — ABNORMAL LOW (ref 8.9–10.3)
Chloride: 109 mmol/L (ref 98–111)
Creatinine, Ser: 1.85 mg/dL — ABNORMAL HIGH (ref 0.61–1.24)
GFR, Estimated: 42 mL/min — ABNORMAL LOW (ref >60)
Glucose, Bld: 300 mg/dL — ABNORMAL HIGH (ref 70–99)
Potassium: 5.1 mmol/L (ref 3.5–5.1)
Sodium: 136 mmol/L (ref 135–145)

## 2021-09-07 LAB — MAGNESIUM
Magnesium: 1.6 mg/dL — ABNORMAL LOW (ref 1.7–2.4)
Magnesium: 1.8 mg/dL (ref 1.7–2.4)

## 2021-09-07 LAB — COMPREHENSIVE METABOLIC PANEL
ALT: 32 U/L (ref 0–44)
AST: 33 U/L (ref 15–41)
Albumin: <1.5 g/dL — ABNORMAL LOW (ref 3.5–5.0)
Alkaline Phosphatase: 124 U/L (ref 38–126)
Anion gap: 9 (ref 5–15)
BUN: 41 mg/dL — ABNORMAL HIGH (ref 6–20)
CO2: 18 mmol/L — ABNORMAL LOW (ref 22–32)
Calcium: 6.5 mg/dL — ABNORMAL LOW (ref 8.9–10.3)
Chloride: 108 mmol/L (ref 98–111)
Creatinine, Ser: 2.89 mg/dL — ABNORMAL HIGH (ref 0.61–1.24)
GFR, Estimated: 24 mL/min — ABNORMAL LOW (ref >60)
Glucose, Bld: 248 mg/dL — ABNORMAL HIGH (ref 70–99)
Potassium: 4.4 mmol/L (ref 3.5–5.1)
Sodium: 135 mmol/L (ref 135–145)
Total Bilirubin: 0.5 mg/dL (ref 0.3–1.2)
Total Protein: 3.2 g/dL — ABNORMAL LOW (ref 6.5–8.1)

## 2021-09-07 LAB — BPAM RBC
Blood Product Expiration Date: 202309162359
Blood Product Expiration Date: 202309242359
Blood Product Expiration Date: 202309242359
Blood Product Expiration Date: 202309242359
ISSUE DATE / TIME: 202309010738
ISSUE DATE / TIME: 202309011406
ISSUE DATE / TIME: 202309021424
ISSUE DATE / TIME: 202309021554
Unit Type and Rh: 600
Unit Type and Rh: 600
Unit Type and Rh: 600
Unit Type and Rh: 600

## 2021-09-07 LAB — DIC (DISSEMINATED INTRAVASCULAR COAGULATION)PANEL
D-Dimer, Quant: 1.38 ug/mL-FEU — ABNORMAL HIGH (ref 0.00–0.50)
Fibrinogen: 464 mg/dL (ref 210–475)
INR: 2 — ABNORMAL HIGH (ref 0.8–1.2)
Platelets: 108 10*3/uL — ABNORMAL LOW (ref 150–400)
Prothrombin Time: 22.2 seconds — ABNORMAL HIGH (ref 11.4–15.2)
Smear Review: NONE SEEN
aPTT: 32 seconds (ref 24–36)

## 2021-09-07 LAB — HEMOGLOBIN AND HEMATOCRIT, BLOOD
HCT: 12.2 % — ABNORMAL LOW (ref 39.0–52.0)
Hemoglobin: 4.2 g/dL — CL (ref 13.0–17.0)

## 2021-09-07 LAB — PREPARE RBC (CROSSMATCH)

## 2021-09-07 LAB — TROPONIN I (HIGH SENSITIVITY): Troponin I (High Sensitivity): 20 ng/L — ABNORMAL HIGH (ref <18)

## 2021-09-07 LAB — CORTISOL: Cortisol, Plasma: 32.4 ug/dL

## 2021-09-07 LAB — PROCALCITONIN: Procalcitonin: 18.05 ng/mL

## 2021-09-07 LAB — MRSA NEXT GEN BY PCR, NASAL: MRSA by PCR Next Gen: DETECTED — AB

## 2021-09-07 MED ORDER — INSULIN GLARGINE-YFGN 100 UNIT/ML ~~LOC~~ SOLN
10.0000 [IU] | Freq: Every day | SUBCUTANEOUS | Status: DC
  Administered 2021-09-07 – 2021-09-08 (×2): 10 [IU] via SUBCUTANEOUS
  Filled 2021-09-07 (×2): qty 0.1

## 2021-09-07 MED ORDER — PROPOFOL 1000 MG/100ML IV EMUL
INTRAVENOUS | Status: AC
  Filled 2021-09-07: qty 100

## 2021-09-07 MED ORDER — MIDAZOLAM HCL 2 MG/2ML IJ SOLN
INTRAMUSCULAR | Status: AC
  Filled 2021-09-07: qty 2

## 2021-09-07 MED ORDER — FENTANYL BOLUS VIA INFUSION
50.0000 ug | INTRAVENOUS | Status: DC | PRN
  Administered 2021-09-08: 100 ug via INTRAVENOUS
  Administered 2021-09-08 – 2021-09-11 (×3): 50 ug via INTRAVENOUS
  Administered 2021-09-12: 100 ug via INTRAVENOUS
  Administered 2021-09-12: 50 ug via INTRAVENOUS
  Administered 2021-09-12: 100 ug via INTRAVENOUS

## 2021-09-07 MED ORDER — IOHEXOL 300 MG/ML  SOLN
100.0000 mL | Freq: Once | INTRAMUSCULAR | Status: AC | PRN
  Administered 2021-09-07: 15 mL via INTRA_ARTERIAL

## 2021-09-07 MED ORDER — FENTANYL CITRATE (PF) 100 MCG/2ML IJ SOLN
INTRAMUSCULAR | Status: AC
  Filled 2021-09-07: qty 2

## 2021-09-07 MED ORDER — NOREPINEPHRINE 4 MG/250ML-% IV SOLN
INTRAVENOUS | Status: AC
  Filled 2021-09-07: qty 250

## 2021-09-07 MED ORDER — PHENYLEPHRINE CONCENTRATED 100MG/250ML (0.4 MG/ML) INFUSION SIMPLE
0.0000 ug/min | INTRAVENOUS | Status: DC
  Administered 2021-09-07: 200 ug/min via INTRAVENOUS
  Administered 2021-09-08 (×5): 400 ug/min via INTRAVENOUS
  Administered 2021-09-09: 380 ug/min via INTRAVENOUS
  Administered 2021-09-09: 280 ug/min via INTRAVENOUS
  Filled 2021-09-07 (×10): qty 250

## 2021-09-07 MED ORDER — SODIUM CHLORIDE 0.9% FLUSH
10.0000 mL | Freq: Two times a day (BID) | INTRAVENOUS | Status: DC
  Administered 2021-09-07 (×2): 10 mL
  Administered 2021-09-08: 20 mL
  Administered 2021-09-09 – 2021-09-12 (×7): 10 mL
  Administered 2021-09-12: 20 mL
  Administered 2021-09-13 – 2021-09-14 (×4): 10 mL
  Administered 2021-09-15 (×2): 20 mL
  Administered 2021-09-16 – 2021-09-20 (×10): 10 mL
  Administered 2021-09-21: 20 mL
  Administered 2021-09-21 – 2021-10-08 (×25): 10 mL
  Administered 2021-10-09: 40 mL
  Administered 2021-10-09: 10 mL
  Administered 2021-10-10: 20 mL
  Administered 2021-10-11 – 2021-10-16 (×11): 10 mL
  Administered 2021-10-17: 30 mL
  Administered 2021-10-18 – 2021-10-21 (×7): 10 mL
  Administered 2021-10-21: 20 mL
  Administered 2021-10-22 – 2021-10-23 (×3): 10 mL
  Administered 2021-10-23: 30 mL
  Administered 2021-10-24 – 2021-10-25 (×2): 10 mL
  Administered 2021-10-26: 20 mL
  Administered 2021-10-27 – 2021-10-30 (×8): 10 mL

## 2021-09-07 MED ORDER — PROPOFOL 1000 MG/100ML IV EMUL: 0.0000 ug/kg/min | INTRAVENOUS | Status: DC

## 2021-09-07 MED ORDER — LACTATED RINGERS IV BOLUS
1000.0000 mL | Freq: Once | INTRAVENOUS | Status: AC
  Administered 2021-09-07: 1000 mL via INTRAVENOUS

## 2021-09-07 MED ORDER — PIPERACILLIN-TAZOBACTAM 3.375 G IVPB 30 MIN
3.3750 g | Freq: Once | INTRAVENOUS | Status: AC
  Administered 2021-09-07: 3.375 g via INTRAVENOUS
  Filled 2021-09-07 (×2): qty 50

## 2021-09-07 MED ORDER — MAGNESIUM SULFATE 4 GM/100ML IV SOLN
4.0000 g | Freq: Once | INTRAVENOUS | Status: AC
  Administered 2021-09-07: 4 g via INTRAVENOUS
  Filled 2021-09-07: qty 100

## 2021-09-07 MED ORDER — CEFTRIAXONE SODIUM 2 G IV SOLR
INTRAVENOUS | Status: AC | PRN
  Administered 2021-09-07: 2 g via INTRAVENOUS

## 2021-09-07 MED ORDER — IOHEXOL 300 MG/ML  SOLN
100.0000 mL | Freq: Once | INTRAMUSCULAR | Status: AC | PRN
  Administered 2021-09-07: 55 mL via INTRA_ARTERIAL

## 2021-09-07 MED ORDER — SODIUM BICARBONATE 8.4 % IV SOLN
100.0000 meq | Freq: Once | INTRAVENOUS | Status: AC
  Administered 2021-09-07: 100 meq via INTRAVENOUS

## 2021-09-07 MED ORDER — CALCIUM GLUCONATE-NACL 1-0.675 GM/50ML-% IV SOLN
1.0000 g | Freq: Once | INTRAVENOUS | Status: AC
  Administered 2021-09-07: 1000 mg via INTRAVENOUS
  Filled 2021-09-07: qty 50

## 2021-09-07 MED ORDER — PIPERACILLIN-TAZOBACTAM 3.375 G IVPB: 3.3750 g | Freq: Three times a day (TID) | INTRAVENOUS | Status: DC

## 2021-09-07 MED ORDER — ETOMIDATE 2 MG/ML IV SOLN
20.0000 mg | Freq: Once | INTRAVENOUS | Status: AC
Start: 2021-09-07 — End: 2021-09-07

## 2021-09-07 MED ORDER — KETAMINE HCL 50 MG/5ML IJ SOSY
PREFILLED_SYRINGE | INTRAMUSCULAR | Status: AC
  Filled 2021-09-07: qty 5

## 2021-09-07 MED ORDER — CALCIUM GLUCONATE-NACL 1-0.675 GM/50ML-% IV SOLN
1.0000 g | Freq: Once | INTRAVENOUS | Status: AC
Start: 2021-09-07 — End: 2021-09-07
  Administered 2021-09-07: 1000 mg via INTRAVENOUS
  Filled 2021-09-07: qty 50

## 2021-09-07 MED ORDER — IOHEXOL 300 MG/ML  SOLN
80.0000 mL | Freq: Once | INTRAMUSCULAR | Status: AC | PRN
  Administered 2021-09-07: 80 mL via INTRAVENOUS

## 2021-09-07 MED ORDER — NOREPINEPHRINE 16 MG/250ML-% IV SOLN: 0.0000 ug/min | INTRAVENOUS | Status: DC

## 2021-09-07 MED ORDER — NOREPINEPHRINE 16 MG/250ML-% IV SOLN
0.0000 ug/min | INTRAVENOUS | Status: DC
  Administered 2021-09-07 – 2021-09-08 (×4): 40 ug/min via INTRAVENOUS
  Administered 2021-09-09: 16 ug/min via INTRAVENOUS
  Administered 2021-09-09: 7 ug/min via INTRAVENOUS
  Administered 2021-09-10: 4 ug/min via INTRAVENOUS
  Filled 2021-09-07 (×6): qty 250

## 2021-09-07 MED ORDER — SODIUM BICARBONATE 8.4 % IV SOLN
INTRAVENOUS | Status: AC
  Filled 2021-09-07: qty 100

## 2021-09-07 MED ORDER — MUPIROCIN 2 % EX OINT
1.0000 | TOPICAL_OINTMENT | Freq: Two times a day (BID) | CUTANEOUS | Status: AC
  Administered 2021-09-07 – 2021-09-11 (×10): 1 via NASAL
  Filled 2021-09-07 (×3): qty 22

## 2021-09-07 MED ORDER — FENTANYL CITRATE (PF) 100 MCG/2ML IJ SOLN
INTRAMUSCULAR | Status: AC | PRN
  Administered 2021-09-07 (×2): 100 ug via INTRAVENOUS

## 2021-09-07 MED ORDER — MEROPENEM 1 G IV SOLR
1.0000 g | Freq: Three times a day (TID) | INTRAVENOUS | Status: DC
  Administered 2021-09-07 – 2021-09-11 (×11): 1 g via INTRAVENOUS
  Filled 2021-09-07 (×11): qty 20

## 2021-09-07 MED ORDER — VASOPRESSIN 20 UNITS/100 ML INFUSION FOR SHOCK
0.0000 [IU]/min | INTRAVENOUS | Status: DC
  Administered 2021-09-08 – 2021-09-10 (×6): 0.03 [IU]/min via INTRAVENOUS
  Filled 2021-09-07 (×5): qty 100

## 2021-09-07 MED ORDER — SODIUM CHLORIDE 0.9 % IV SOLN
INTRAVENOUS | Status: AC
  Filled 2021-09-07: qty 20

## 2021-09-07 MED ORDER — ROCURONIUM BROMIDE 50 MG/5ML IV SOLN: 100.0000 mg | Freq: Once | INTRAVENOUS | Status: AC

## 2021-09-07 MED ORDER — ACETAMINOPHEN 325 MG PO TABS
650.0000 mg | ORAL_TABLET | Freq: Four times a day (QID) | ORAL | Status: DC | PRN
  Administered 2021-09-07: 650 mg via NASOGASTRIC
  Filled 2021-09-07: qty 2

## 2021-09-07 MED ORDER — DOCUSATE SODIUM 50 MG/5ML PO LIQD: 100.0000 mg | Freq: Two times a day (BID) | ORAL | Status: DC

## 2021-09-07 MED ORDER — ETOMIDATE 2 MG/ML IV SOLN
INTRAVENOUS | Status: AC
  Filled 2021-09-07: qty 20

## 2021-09-07 MED ORDER — FENTANYL 2500MCG IN NS 250ML (10MCG/ML) PREMIX INFUSION
50.0000 ug/h | INTRAVENOUS | Status: DC
  Administered 2021-09-07 – 2021-09-11 (×3): 50 ug/h via INTRAVENOUS
  Filled 2021-09-07 (×2): qty 250

## 2021-09-07 MED ORDER — SODIUM BICARBONATE 8.4 % IV SOLN
INTRAVENOUS | Status: DC
  Filled 2021-09-07 (×2): qty 1000

## 2021-09-07 MED ORDER — MIDAZOLAM HCL 2 MG/2ML IJ SOLN
INTRAMUSCULAR | Status: AC | PRN
  Administered 2021-09-07 (×2): 2 mg via INTRAVENOUS

## 2021-09-07 MED ORDER — SUCCINYLCHOLINE CHLORIDE 200 MG/10ML IV SOSY
PREFILLED_SYRINGE | INTRAVENOUS | Status: AC
  Filled 2021-09-07: qty 10

## 2021-09-07 MED ORDER — SODIUM CHLORIDE 0.9% IV SOLUTION: Freq: Once | INTRAVENOUS | Status: AC

## 2021-09-07 MED ORDER — FENTANYL CITRATE PF 50 MCG/ML IJ SOSY: 50.0000 ug | PREFILLED_SYRINGE | Freq: Once | INTRAMUSCULAR | Status: DC

## 2021-09-07 MED ORDER — FENTANYL CITRATE PF 50 MCG/ML IJ SOSY
50.0000 ug | PREFILLED_SYRINGE | INTRAMUSCULAR | Status: DC | PRN
  Administered 2021-09-07: 50 ug via INTRAVENOUS
  Administered 2021-09-07: 200 ug via INTRAVENOUS
  Filled 2021-09-07: qty 4
  Filled 2021-09-07: qty 2

## 2021-09-07 MED ORDER — FENTANYL CITRATE PF 50 MCG/ML IJ SOSY
50.0000 ug | PREFILLED_SYRINGE | INTRAMUSCULAR | Status: DC | PRN
  Administered 2021-09-07 (×2): 50 ug via INTRAVENOUS

## 2021-09-07 MED ORDER — FENTANYL 2500MCG IN NS 250ML (10MCG/ML) PREMIX INFUSION
INTRAVENOUS | Status: AC
  Filled 2021-09-07: qty 250

## 2021-09-07 MED ORDER — SODIUM ZIRCONIUM CYCLOSILICATE 10 G PO PACK
10.0000 g | PACK | Freq: Three times a day (TID) | ORAL | Status: DC
  Administered 2021-09-07: 10 g
  Filled 2021-09-07: qty 1

## 2021-09-07 MED ORDER — VASOPRESSIN 20 UNITS/100 ML INFUSION FOR SHOCK
INTRAVENOUS | Status: AC
  Administered 2021-09-07: 0.03 [IU]/min via INTRAVENOUS
  Filled 2021-09-07: qty 100

## 2021-09-07 MED ORDER — ROCURONIUM BROMIDE 10 MG/ML (PF) SYRINGE
PREFILLED_SYRINGE | INTRAVENOUS | Status: AC
  Filled 2021-09-07: qty 10

## 2021-09-07 MED ORDER — TAMSULOSIN HCL 0.4 MG PO CAPS
0.4000 mg | ORAL_CAPSULE | Freq: Every day | ORAL | Status: DC
  Administered 2021-09-07: 0.4 mg via ORAL
  Filled 2021-09-07: qty 1

## 2021-09-07 MED ORDER — SODIUM CHLORIDE 0.9% IV SOLUTION: Freq: Once | INTRAVENOUS | Status: DC

## 2021-09-07 MED ORDER — LIDOCAINE HCL 1 % IJ SOLN
INTRAMUSCULAR | Status: AC
  Filled 2021-09-07: qty 20

## 2021-09-07 MED ORDER — SODIUM CHLORIDE 0.9% FLUSH
10.0000 mL | INTRAVENOUS | Status: DC | PRN
  Administered 2021-09-14 – 2021-10-26 (×8): 10 mL

## 2021-09-07 MED ORDER — POLYETHYLENE GLYCOL 3350 17 G PO PACK: 17.0000 g | PACK | Freq: Every day | ORAL | Status: DC

## 2021-09-07 MED ORDER — FENTANYL CITRATE PF 50 MCG/ML IJ SOSY
PREFILLED_SYRINGE | INTRAMUSCULAR | Status: AC
  Filled 2021-09-07: qty 2

## 2021-09-07 MED ORDER — NOREPINEPHRINE 4 MG/250ML-% IV SOLN
0.0000 ug/min | INTRAVENOUS | Status: DC
  Administered 2021-09-07: 40 ug/min via INTRAVENOUS
  Administered 2021-09-07: 18 ug/min via INTRAVENOUS
  Administered 2021-09-07 (×2): 40 ug/min via INTRAVENOUS
  Administered 2021-09-07: 20 ug/min via INTRAVENOUS
  Filled 2021-09-07: qty 250
  Filled 2021-09-07: qty 500
  Filled 2021-09-07 (×2): qty 250

## 2021-09-07 NOTE — Procedures (Signed)
Intubation Procedure Note  ARINZE RIVADENEIRA  [id number]  12-20-1960  Date:09/07/21  Time:1:45 PM   Provider Performing:Zorianna Taliaferro Kathie Rhodes Celine Mans    Procedure: Intubation (31500)  Indication(s) Respiratory Failure  Consent Unable to obtain consent due to emergent nature of procedure.   Anesthesia Etomidate and Rocuronium   Time Out Verified patient identification, verified procedure, site/side was marked, verified correct patient position, special equipment/implants available, medications/allergies/relevant history reviewed, required imaging and test results available.   Sterile Technique Usual hand hygeine, masks, and gloves were used   Procedure Description Patient positioned in bed supine.  Sedation given as noted above.  Patient was intubated with endotracheal tube using Glidescope.  View was Grade 1 full glottis .  Number of attempts was 1.  Colorimetric CO2 detector was consistent with tracheal placement. Bilateral breath sounds. 8.0 ETT secured 25 at the LIP.    Complications/Tolerance None; patient tolerated the procedure well. Chest X-ray is ordered to verify placement.   EBL Minimal   Specimen(s) None  Durel Salts, MD Pulmonary and Critical Care Medicine [facility] 09/07/2021 1:45 PM Pager: see AMION  If no response to pager, please call critical care on call (see AMION) until 7pm After 7:00 pm call Elink

## 2021-09-07 NOTE — Progress Notes (Signed)
Progress update:  Discussed case with Dr. Grace Isaac. Percutaneous cholecysteostomy tube placed given CT angio findings. No discrete extravasation. Suspect this was a mixed picture with septic and hemorrhagic shock. Clinical improvement after drain placed with foul smelling drainage. Abx started for cholecystitis. Vasopressin started and additional IVF started.   Blood cultures drawn this morning and pending.   Additional CC time 37 minutes

## 2021-09-07 NOTE — Sedation Documentation (Addendum)
ICU nurse (Tallent) in charge of administering the IV Versed and Fentanyl, along with altering the Levophed gtt.

## 2021-09-07 NOTE — Procedures (Signed)
Small Bowel Givens Capsule Study Procedure date: 09/06/2021  Referring Provider:  Erick Blinks, MD PCP:  Dr. Margo Aye, Kathleene Hazel, MD  Indication for procedure:  Small bowel bleeding  Findings:  Study was suboptimal as capsule did not reach the cecum and visualization was limited due to extensive blood in the lumen. The capsule was deployed in the duodenum via EGD.  There was presence of extensive amount of fresh blood and clots in the mid small bowel starting at 02:47:52 until 03:31:10. At that point, the blood was darker. No lesions could be identified which could pinpoint to an etiology of the bleeding.      First Gastric image:  00:02:08 First Duodenal image: 00:02:31 Gastric Passage time: 0h 3m 23s - deployed in duodenum endoscopically Small Bowel Passage time:  not quantified  Summary & Recommendations: There was presence of extensive amount of fresh blood and clots in the mid small bowel starting at 02:47:52 until 03:31:10. At that point, the blood was darker. No lesions could be identified which could pinpoint to an etiology of the bleeding.  However, patient underwent a repeat CTA angio emergently and was found to have an active jejunal diverticular bleeding for which he underwent embolization of the jejunal blood supply to the region of most colon.  -Continue care at [facility] by interventional radiology -Patient will need follow-up appointment in the GI office upon discharge with Dr. Jeani Hawking, MD  I personally communicated these recommendations to the patient  Katrinka Blazing, MD Gastroenterology and Hepatology [facility]

## 2021-09-07 NOTE — Progress Notes (Signed)
Supervising Physician: Sandi Mariscal  Patient Status:  [facility]  Chief Complaint:  GIB with active extravasation on CTA, 1 day s/p COIL EMBOLIZATION of SMA TERRITORY, JEJUNAL ARCADE BRANCH  Subjective:  Troy Randall lying almost flat in bed with gaze fixed towards the ceiling.  Not moving extremities.    Responds to direct questions with 1 or 2 words. Weakly states he's feeling "better"   Family arrived during exam.   Allergies: Metformin and related and Penicillins  Medications: Prior to Admission medications   Medication Sig Start Date End Date Taking? Authorizing Provider  amLODipine (NORVASC) 10 MG tablet Take 1 tablet (10 mg total) by mouth daily. 06/15/17  Yes Caren Macadam, MD  aspirin EC 81 MG tablet Take 81 mg by mouth daily. Swallow whole.   Yes [provider]  clopidogrel (PLAVIX) 75 MG tablet Take 1 tablet (75 mg total) by mouth daily. 04/30/21  Yes Little Ishikawa, MD  glipiZIDE (GLUCOTROL XL) 10 MG 24 hr tablet Take 1 tablet (10 mg total) by mouth daily with breakfast. 04/16/17  Yes Hagler, Apolonio Schneiders, MD  levocetirizine (XYZAL) 5 MG tablet Take 1 tablet by mouth at bedtime.   Yes [provider]  lisinopril (PRINIVIL,ZESTRIL) 40 MG tablet Take 1 tablet (40 mg total) by mouth daily. 05/12/17  Yes Caren Macadam, MD  metFORMIN (GLUCOPHAGE-XR) 500 MG 24 hr tablet Take 500-1,000 mg by mouth in the morning and at bedtime. 02/09/21  Yes [provider]  rosuvastatin (CRESTOR) 40 MG tablet Take 1 tablet (40 mg total) by mouth daily. 04/29/21  Yes Little Ishikawa, MD  tamsulosin (FLOMAX) 0.4 MG CAPS capsule Take 0.4 mg by mouth daily. 02/12/21  Yes [provider]  traZODone (DESYREL) 150 MG tablet Take 150 mg by mouth at bedtime. 02/04/21  Yes [provider]  TRESIBA FLEXTOUCH 200 UNIT/ML FlexTouch Pen Inject 20 Units into the skin in the morning and at bedtime. 03/14/21  Yes [provider]  BD VEO INSULIN SYRINGE U/F 31G X  15/64" 1 ML MISC  USE AS DIRECTED 06/08/17   Caren Macadam, MD  glucose blood (ONETOUCH VERIO) test strip TEST twice a day 05/04/17   Caren Macadam, MD  Insulin Pen Needle (NOVOTWIST) 32G X 5 MM MISC Use two daily to inject Victoza and Toujeo. 04/25/15   Elayne Snare, MD  [facility] DELICA LANCETS [id number] MISC Use to check blood sugar once a day dx code E11.65 11/21/14   Elayne Snare, MD     Vital Signs: BP (!) 118/97   Pulse (!) 125   Temp (!) 101.5 F (38.6 C) (Axillary)   Resp 20   Ht 6' (1.829 m)   Wt (!) 325 lb 2.9 oz (147.5 kg)   SpO2 97%   BMI 44.10 kg/m   Physical Exam Vitals reviewed.  Constitutional:      Appearance: He is ill-appearing and diaphoretic.  HENT:     Head: Normocephalic and atraumatic.     Mouth/Throat:     Pharynx: Oropharynx is clear.  Cardiovascular:     Rate and Rhythm: Tachycardia present.     Pulses: Normal pulses.  Pulmonary:     Comments: mildly SOB Skin:    General: Skin is cool and moist.     Coloration: Skin is pale.     Comments: Right CFA access site is soft, without pseudoaneurysm or evidence of bleeding.  Neurological:     Mental Status: He is alert.     Imaging: [place]  EKG SITE RITE  Result Date: 09/06/2021 If Site Rite image not attached, placement could not be confirmed due to current cardiac rhythm.  DG CHEST PORT 1 VIEW  Result Date: 09/06/2021 CLINICAL DATA:  Shortness of breath. EXAM: PORTABLE CHEST 1 VIEW COMPARISON:  October 10, 2004. FINDINGS: Stable cardiomediastinal silhouette. Lungs are clear. Mildly elevated right hemidiaphragm is noted. Bony thorax is unremarkable. IMPRESSION: No active disease. Electronically Signed   By: Marijo Conception M.D.   On: 09/06/2021 16:37   CT ANGIO GI BLEED  Addendum Date: 09/06/2021   ADDENDUM REPORT: 09/06/2021 15:13 ADDENDUM: Critical Value/emergent results were called by telephone at the time of interpretation on 09/06/2021 at 3:12 pm to provider [facility] , who verbally acknowledged  these results. Electronically Signed   By: Misty Stanley M.D.   On: 09/06/2021 15:13   Result Date: 09/06/2021 CLINICAL DATA:  Rectal bleeding. EXAM: CTA ABDOMEN AND PELVIS WITHOUT AND WITH CONTRAST TECHNIQUE: Multidetector CT imaging of the abdomen and pelvis was performed using the standard protocol during bolus administration of intravenous contrast. Multiplanar reconstructed images and MIPs were obtained and reviewed to evaluate the vascular anatomy. RADIATION DOSE REDUCTION: This exam was performed according to the departmental dose-optimization program which includes automated exposure control, adjustment of the mA and/or kV according to patient size and/or use of iterative reconstruction technique. CONTRAST:  122mL OMNIPAQUE IOHEXOL 350 MG/ML SOLN COMPARISON:  09/05/2021 FINDINGS: VASCULAR Aorta: Normal caliber aorta without aneurysm, dissection, vasculitis or significant stenosis. Mild atherosclerotic calcification noted. Celiac: Patent without evidence of aneurysm, dissection, vasculitis or significant stenosis. Mass-effect on the celiac origin likely secondary to compression by the median arcuate ligament. SMA: Patent without evidence of aneurysm, dissection, vasculitis or significant stenosis. Replaced right hepatic artery. Renals: Both renal arteries are patent without evidence of aneurysm, dissection, vasculitis, fibromuscular dysplasia or significant stenosis. Accessory left renal artery noted. 2 accessory renal arteries noted right kidney, 1 of which arises from the right common iliac artery. IMA: Patent without evidence of aneurysm, dissection, vasculitis or significant stenosis. Inflow: Patent without evidence of aneurysm, dissection, vasculitis or significant stenosis. Proximal Outflow: Bilateral common femoral and visualized portions of the superficial and profunda femoral arteries are patent without evidence of aneurysm, dissection, vasculitis or significant stenosis. Veins: No obvious venous  abnormality within the limitations of this arterial phase study. Review of the MIP images confirms the above findings. NON-VASCULAR Lower chest: Tiny pulmonary nodules again identified measuring up to 4 mm. Hepatobiliary: No suspicious focal abnormality within the liver parenchyma. Small calcified gallstones evident. No intrahepatic or extrahepatic biliary dilation. Pancreas: No focal mass lesion. No dilatation of the main duct. No intraparenchymal cyst. No peripancreatic edema. Spleen: No splenomegaly. No focal mass lesion. Adrenals/Urinary Tract: No adrenal nodule or mass. Tiny nonobstructing stones are noted in both kidneys. Small cyst noted lower interpolar right kidney. Left kidney unremarkable. No evidence for hydroureter. The urinary bladder appears normal for the degree of distention. Stomach/Bowel: Stomach is unremarkable. No gastric wall thickening. No evidence of outlet obstruction. Duodenum is normally positioned as is the ligament of Treitz. Duodenal diverticuli noted. No small bowel wall thickening. No small bowel dilatation. Active arterial phase bleeding is identified in in a small bowel loop of the left abdomen. Bleeding appears to arise in a diverticulum (axial image 96 series 9) with layering of extravasated contrast dependently within the bowel lumen. Apparent diverticulum also well demonstrated on coronal image 64/11 with additional small bowel diverticuli seen in the same region (also coronal  64/11). There is a capsule endoscope in the proximal duodenum on precontrast imaging but has migrated into the proximal jejunum by postcontrast imaging. The terminal ileum is normal. The appendix is normal. No gross colonic mass. No colonic wall thickening. Lymphatic: There is no gastrohepatic or hepatoduodenal ligament lymphadenopathy. No retroperitoneal or mesenteric lymphadenopathy. No pelvic sidewall lymphadenopathy. Reproductive: The prostate gland and seminal vesicles are unremarkable. Other: No  intraperitoneal free fluid. Musculoskeletal: No worrisome lytic or sclerotic osseous abnormality. Ventral mesh evident. There is a paraumbilical ventral hernia containing only fat. IMPRESSION: VASCULAR 1. Active arterial phase extravasation identified in a small bowel loop of the left upper quadrant, likely jejunal. The source of the bleeding appears to be a diverticulum and multiple additional diverticuli are seen in the duodenum and proximal small bowel. 2. Abdominal aortic atherosclerosis without aneurysm. NON-VASCULAR 1. Capsule endoscope identified in the duodenum on precontrast imaging migrates into proximal small bowel by postcontrast series. 2. Cholelithiasis. 3. Bilateral nonobstructing nephrolithiasis. 4. Duodenal and jejunal diverticuli. 5. Tiny pulmonary nodules again noted in both lung bases measuring up to 4 mm. No follow-up needed if patient is low-risk (and has no known or suspected primary neoplasm). Non-contrast chest CT can be considered in 12 months if patient is high-risk. This recommendation follows the consensus statement: Guidelines for Management of Incidental Pulmonary Nodules Detected on CT Images: From the Fleischner Society 2017; Radiology 2017; 284:228-243. 6. Paraumbilical anterior abdominal wall hernia containing only fat in this patient status post ventral mesh placement. Hernia may be along the inferior margin of the existing mash. Electronically Signed: By: Kennith Center M.D. On: 09/06/2021 15:06   CT Angio Abd/Pel w/ and/or w/o  Result Date: 09/05/2021 CLINICAL DATA:  61 year old with rectal bleeding. EXAM: CTA ABDOMEN AND PELVIS WITHOUT AND WITH CONTRAST TECHNIQUE: Multidetector CT imaging of the abdomen and pelvis was performed using the standard protocol during bolus administration of intravenous contrast. Multiplanar reconstructed images and MIPs were obtained and reviewed to evaluate the vascular anatomy. RADIATION DOSE REDUCTION: This exam was performed according to the  departmental dose-optimization program which includes automated exposure control, adjustment of the mA and/or kV according to patient size and/or use of iterative reconstruction technique. CONTRAST:  OMNIPAQUE IOHEXOL 350 MG/ML SOLN COMPARISON:  None Available. FINDINGS: VASCULAR Aorta: Normal caliber aorta without aneurysm, dissection, vasculitis or significant stenosis. Celiac: Patent without evidence of aneurysm, dissection, vasculitis or significant stenosis. Mild narrowing in the proximal celiac artery is likely related to median arcuate ligament compression. SMA: Replaced right hepatic artery which is a normal variant. SMA is widely patent without aneurysm or dissection. Renals: Bilateral renal arteries are patent without evidence of aneurysm, dissection, vasculitis, fibromuscular dysplasia or significant stenosis. Single accessory left renal artery and 2 small accessory right renal arteries. Most inferior right accessory renal artery originates from the proximal right common iliac artery. IMA: Patent without evidence of aneurysm, dissection, vasculitis or significant stenosis. Inflow: Patent without evidence of aneurysm, dissection, vasculitis or significant stenosis. Proximal Outflow: Proximal femoral arteries are patent bilaterally. Veins: Portal venous system is patent. IVC is patent. There is a retroaortic left renal vein which is a normal variant. IVC and iliac veins are patent. Review of the MIP images confirms the above findings. NON-VASCULAR Lower chest: Several small nodules at the both lung bases. Largest nodule measures 4 mm in the left lower lobe on sequence 6 image 37. Coronary artery calcifications. Hepatobiliary: Layering gallstones. Mild distention of the gallbladder without surrounding inflammatory changes. Normal appearance of the liver  without intrahepatic or extrahepatic biliary dilatation. Pancreas: Unremarkable. No pancreatic ductal dilatation or surrounding inflammatory changes.  Spleen: Normal in size without focal abnormality. Adrenals/Urinary Tract: Normal adrenals bilaterally. 3 mm stone in the right kidney interpolar region. Probably 3 small stones in the left kidney, largest is in the left interpolar region measuring 3 mm. Negative for hydronephrosis. No suspicious renal lesions. 1.2 cm low-density structure in the right kidney interpolar region is most compatible with a cyst and does not require dedicated follow-up. Normal appearance of the urinary bladder. Stomach/Bowel: Duodenal diverticula near the pancreatic head. Another diverticulum in the distal duodenum. Diverticulum involving the small bowel on sequence 13, image 88 measuring up to 1.7 cm. Additional small bowel diverticulum on sequence 13 image 111 measuring up to 2.1 cm. No evidence for bowel dilatation or focal bowel inflammation. No evidence for active GI bleeding. Normal appendix without inflammatory changes. Lymphatic: No lymph node enlargement in the abdomen or pelvis. Reproductive: Prostate is unremarkable. Other: There is a small complex periumbilical ventral hernia containing fat. Surgical mesh along the anterior abdominal wall. Negative for ascites. Negative for free air. Musculoskeletal: No acute bone abnormality. IMPRESSION: VASCULAR 1. No evidence for active GI bleeding. 2. No significant atherosclerotic disease in the abdomen or pelvis. 3. Coronary artery calcifications. 4. Main visceral arteries are patent with variant anatomy. Replaced right hepatic artery and bilateral accessory renal arteries. NON-VASCULAR 1. No acute abnormality in the abdomen or pelvis. 2. Bilateral nonobstructive nephrolithiasis. 3. Cholelithiasis. 4. Multiple pulmonary nodules. Most severe: 4 mm left solid pulmonary nodule. Per Fleischner Society Guidelines, no routine follow-up imaging is recommended. These guidelines do not apply to immunocompromised patients and patients with cancer. Follow up in patients with significant  comorbidities as clinically warranted. For lung cancer screening, adhere to Lung-RADS guidelines. Reference: Radiology. 2017; 284(1):228-43. 5. Postsurgical changes along the anterior abdominal wall with complex ventral hernia containing fat. 6. Duodenal and jejunal diverticula. Electronically Signed   By: Richarda Overlie M.D.   On: 09/05/2021 12:47    Labs:  CBC: Recent Labs    09/05/21 1112 09/05/21 2000 09/06/21 0426 09/07/21 0613  WBC 8.5 10.4 12.8* 29.1*  HGB 8.0* 8.8* 7.6* 7.6*  HCT 23.4* 26.1* 22.8* 21.5*  PLT 161 172 183 125*    COAGS: Recent Labs    04/28/21 1504 09/05/21 0307 09/06/21 2240  INR 1.0 1.2 1.6*  APTT 26  --   --     BMP: Recent Labs    09/04/21 1238 09/05/21 0307 09/06/21 2240 09/07/21 0613  NA 137 138 137 136  K 4.9 4.5 5.1 5.1  CL 106 111 110 109  CO2 24 24 19* 18*  GLUCOSE 193* 166* 338* 300*  BUN 28* 25* 24* 30*  CALCIUM 8.7* 7.6* 6.7* 6.9*  CREATININE 1.23 1.00 1.77* 1.85*  GFRNONAA >60 >60 44* 42*    LIVER FUNCTION TESTS: Recent Labs    04/28/21 1504 04/29/21 0502 09/04/21 1238 09/06/21 2240  BILITOT 0.5 0.5 0.8 0.4  AST 25 20 15 21   ALT 28 22 17 16   ALKPHOS 38 34* 35* 21*  PROT 7.5 6.8 6.9 3.7*  ALBUMIN 4.2 3.6 3.7 1.8*    Assessment and Plan:  UGI Bleed 1 day s/p coil embolization Hgb up 2 points this morning, Will continue to watch Right CFA access site dressing C/D/I, tissue is soft and there is no bleeding  IR remains available as needed  Electronically Signed: , PA 09/07/2021, 9:14 AM   I spent a  total of 15 Minutes at the the patient's bedside AND on the patient's hospital floor or unit, greater than 50% of which was counseling/coordinating care for post-embolization care

## 2021-09-07 NOTE — Inpatient Diabetes Management (Signed)
Inpatient Diabetes Program Recommendations  AACE/ADA: New Consensus Statement on Inpatient Glycemic Control (2015)  Target Ranges:  Prepandial:   less than 140 mg/dL      Peak postprandial:   less than 180 mg/dL (1-2 hours)      Critically ill patients:  140 - 180 mg/dL   Lab Results  Component Value Date   GLUCAP 260 (H) 09/07/2021   HGBA1C 6.2 (H) 04/29/2021    Review of Glycemic Control  Diabetes history: DM2 Outpatient Diabetes medications: glipizide 10 mg QAM, metformin 500 in am and 1000 mg QHS, Tresiba 20 units BID Current orders for Inpatient glycemic control: Semglee 10 units QD, Novolog 0-15 units Q4H  Last HgbA1C 04/29/21 - 6.2% CBGs 333, 300, 360 mg/dL  Inpatient Diabetes Program Recommendations:    Consider increasing Semglee to 10 units BID  Add Novolog 4 units TID with meals when diet is advanced and pt eating > 50%.  Follow closely.  Thank you. Ailene Ards, RD, LDN, CDCES Inpatient Diabetes Coordinator [phone]

## 2021-09-07 NOTE — Progress Notes (Addendum)
NAME:  Troy Randall, MRN:  [id number], DOB:  [birth year], LOS: 3 ADMISSION DATE:  09/04/2021, CONSULTATION DATE:  09/06/21 REFERRING MD:  [name], CHIEF COMPLAINT:  melena   History of Present Illness:   Troy Randall is an 61 y.o. male with prior history of HTN, HLD, DM, and CVA on ASA/ plavix who presented to [facility] on 8/31 after developing frequent 6-7 dark stools and feeling dizzy and lightheaded on standing.  No hx of NSAIDs.  FOBT positive and initial Hgb 12.2.    Patient admitted to [facility] with GI consulting.  Remained orthostatic with progressive decline in Hgb.  CTA abdomen did not show any active bleed, therefore underwent colonoscopy > no active bleed found but large clots throughout entire colon.  Then underwent EGD which showed a small hiatal hernia, erosive gastropathy with no stigmata of recent bleeding, and non-bleeding duodenal diverticulum.   Underwent then small bowel enteroscopy which noted intermittent blood pumping in the proximal jejunum but was unable to be reached despite multiple attempts, was injected and clip placed.  Repeat CTA repeated, which showed positive jejunal diverticular bleeding. Patient was transferred to [facility] for IR involvement. In IR active extravasation  at SMA territory, jejunal arcade branch, successful coil embolization.  PCCM was consulted for transfer and admission.  Pertinent  Medical History  HTN, HLD, DM, CVA on ASA/ plavix  Significant Hospital Events: Including procedures, antibiotic start and stop dates in addition to other pertinent events   8/31 presented to [facility], [name] Admit, Gi Consult 9/1 CTA> no evidence of active GI bleed, 2 U PRBC 9/2 2 U PRBC, colonoscopy > no active bleed found but large clots throughout entire colon. EGD> small hiatal hernia, erosive gastropathy with no stigmata of recent bleeding, and non-bleeding duodenal diverticulum.   Underwent then small bowel enteroscopy which noted intermittent blood pumping in the proximal jejunum  but was unable to be reached despite multiple attempts, was injected and clip placed. Repeat CTA repeated, which showed positive jejunal diverticular bleeding. IR> active extrav at [facility] territory, jejunal arcade branch, successful coil embo  Interim History / Subjective:  Had IR embolization of SMA jejunal branch. This morning febrile, tachycardic, on 4LNC which is new.  No bowel movements overnight since coil embolization.   Objective   Blood pressure (!) 118/97, pulse (!) 125, temperature (!) 101.5 F (38.6 C), temperature source Axillary, resp. rate 20, height 6' (1.829 m), weight (!) 147.5 kg, SpO2 97 %.     Intake/Output Summary (Last 24 hours) at 09/07/2021 0914 Last data filed at 09/07/2021 0700 Gross per 24 hour  Intake 5156.67 ml  Output 775 ml  Net 4381.67 ml   Filed Weights   09/04/21 1222 09/06/21 2009  Weight: (!) 139.3 kg (!) 147.5 kg    Examination: Gen:     Laying flat in bed no respiratory distress HEENT:  mmm on nasal cannula Lungs:   clear no wheezes or crackles CV:         tachycardic, regular Abd:      Obese, soft Ext:    No edema Skin:      Warm and dry; no rashes Neuro:   normal speech no focal asymmetry  Resolved Hospital Problem list     Assessment & Plan:  ABLA secondary to lower GI bleed, likely diverticular Acute blood loss anemia, secondary to above - 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 -IR and GI consulted. Appreciate assistance. Post procedure care per IR recs -  BID PPI - suspect this can be transitioned to daily soon since this is not a gastric or esophageal ulcer.  -will advance diet to clear liquids -Transfuse PRBC if HBG less than 7 -Trend HGB, check coags, repeat T&S -Monitor for signs of bleeding -Monitor abdominal exam  Acute hypoxemic respiratory failure - suspect atelectasis, possible sleep disordered bleeding. - OOB, IS  Leukocytosis, fevers - suspect reactive - will obtain chest xray, blood cultures, UA once  foley is removed, clean catch - consider initiation of abx if clinical decompensation or shock in the absence of bleeding  History of CVA - hold asa plavix for now  -monitor neuro exam per unit protocol.  DM2 CBGs elevated. Initiate half dose home basal insulin -Blood Glucose goal 140-180. -SSI  HTN HLD -Hold home antihypertensives and statin  AKI Bilateral nonobstructing nephrolithiasis - suspect chronic BPH -resume flomax - remove foley - encourage enteral hydration, pre-renal vs contrast mediated AKI will obtain UA to eval for infection  Pulmonary nodules in bilateral bases, L 63mm Family reports patient is a never smoker. Family does endorse dipping. -no further follow up needed for a low risk patient based on fleischner criteria   Best Practice (right click and "Reselect all SmartList Selections" daily)   Diet/type: clear liquids, advance as tolerated carb controlled DVT prophylaxis: SCD GI prophylaxis: PPI Lines: N/A Foley:  yes, but removal is ordered Code Status:  full code Last date of multidisciplinary goals of care discussion [Full scope- Discussed with wife [name] 9/2 . Family and patient updated at bedside 9/3 am.]  Consider transfer out of ICU later today.   I spent 50 minutes in total visit time for this patient, with more than 50% spent counseling/coordinating care.  Durel Salts, MD Pulmonary and Critical Care Medicine [facility] 09/07/2021 9:28 AM Pager: see AMION  If no response to pager, please call critical care on call (see AMION) until 7pm After 7:00 pm call Elink

## 2021-09-07 NOTE — Procedures (Signed)
Pre-procedure Diagnosis: Recurrent upper GI (proximal small bowel) bleeding; Acute cholecystitis Post-procedure Diagnosis: Same  Post Mesenteric arteriogram - Sub-selective injections of multiple secondary and tertirary branches of the SMA supplying the area of extravasation on CTA performed day prior was negative for discrete area of vessel irregularity or contrast extravasation.   No embolization performed.  Successful image guided placement of 10 Fr cholecystotomy tube yielding the return of foul smelling bile.   Aspirated sample sent to lab for analysis. Chole tube connected to gravity bag.  Complications: None Immediate EBL: None  Keep right leg straight for 4 hrs (until 2230)  Signed: Simonne Come Pager: [phone] 09/07/2021, 6:27 PM

## 2021-09-07 NOTE — Sedation Documentation (Signed)
Levophed off at this time, being regulated by [facility] ICU RN.

## 2021-09-07 NOTE — Progress Notes (Addendum)
eLink Physician-Brief Progress Note Patient Name: Troy Randall DOB: 1960-08-04 MRN: [id number]   Date of Service  09/07/2021  HPI/Events of Note  Red elert: Camera: Discussed with RN. Hypotensive, propofol, fenta held by RN, ABG stat done showing severe AGMA, pH 7.14 S/p 4 units of PRBC earlier, on Vent for suspected RPH. Was weaned of levophed, going back on and vaso running. In synchrony with vent. Morbidly obese.   eICU Interventions  - stat bicarb 100 meq, follow LA, ABG at 8 PM - start back levo/vaso, to keep MAP > 65 - Hg stable, follow through. - foley bladder pressure to r/o any IAH- Intra abdominal hypertension if not better.       Intervention Category Major Interventions: Acid-Base disturbance - evaluation and management;Other: Intermediate Interventions: Hypotension - evaluation and management  Ranee Gosselin 09/07/2021, 7:06 PM  19:33  Discussed with RN. Fever 100.6, already on zosyn. Blood cx from earlier today no growth, leukocytosis. In shock.  Acidosis, anemia. Hypocalcemia. - 1 gm calcium IV and NG tylenol prn ordered To call back, once cbc, abg, LA follow through.   20:16 Camera: 7.29/39/84/18/94% Hg 6.1 on ABG last Hg: 7.5 from 7 PM. No active bleeding per RN discussion. Maxed out on levo. On vaso and on neo. Post 1 lit LR, CVP still low.   - Transfuse 1 PRBC, 1 more lit of LR bolus. Follow CVP, to keep > 10. - will notify bed side CCM to look at him.  - no indication for Bicarb gtt at this time - follow BMP, CBC , LA, troponin.  - too sick and on 3 pressors to go to CT scan to see any RPH.   20:55 Discussion with Dr Colletta Maryland. - upgrade antibiotics to meropenum, foul smelling Gb fluid when tube exchange done and has fever, leukocytosis. Fungal infection need to be considered if not better - ok to for 4 th pressor [place] if not betterwith above intervention. - Watch potassium, got lokelma - to go on bicarb gtt. Not able to tolerate CRRT if  needed. Making urine. - stress dose steroids . - guarded condition, might be heading towards code blue tonight.

## 2021-09-07 NOTE — Procedures (Signed)
Arterial Catheter Insertion Procedure Note  ENGELBERT SEVIN  [id number]  Aug 02, 1960  Date:09/07/21  Time:2:08 PM    Provider Performing: Heber Tarrant    Procedure: Insertion of Arterial Line (60630) with US guidance (16010)   Indication(s) Blood pressure monitoring and/or need for frequent ABGs  Consent Unable to obtain consent due to emergent nature of procedure.  Anesthesia None   Time Out Verified patient identification, verified procedure, site/side was marked, verified correct patient position, special equipment/implants available, medications/allergies/relevant history reviewed, required imaging and test results available.   Sterile Technique Maximal sterile technique including full sterile barrier drape, hand hygiene, sterile gown, sterile gloves, mask, hair covering, sterile ultrasound probe cover (if used).   Procedure Description Area of catheter insertion was cleaned with chlorhexidine and draped in sterile fashion. With real-time ultrasound guidance an arterial catheter was placed into the left femoral artery.  Appropriate arterial tracings confirmed on monitor.     Complications/Tolerance None; patient tolerated the procedure well.   EBL Minimal   Specimen(s) None  Heber Chama, MD [place] [facility] Pager: [phone] Cell: [phone] After 7:00 pm call Elink  [phone]

## 2021-09-07 NOTE — Progress Notes (Signed)
Called to bedside for decreased responsiveness. He was hypotensive, tachycardic, with cool and pale extremities. Initiated norepinephrine, fluid bolus started.   Suspicion for worsening bleeding - no obvious melena yet. Sent stat labs - H&H, trop, lactic acid, bmet.  ABG attempted by RT, ND unable to obtain so VBG sent. After norepinephrine started he was slightly more responsive and responding to verbal stimuli. Difficulty obtaining accurate readings. Plan to place  arterial line. Patient intubated for decreased responsiveness.   Hgb returned at <5. Suspected RP bleed given no ongoing melena. Three units prbc obtained and pressure bagged into patient. Norepi maintained.   Plan is to finish blood and go for CT A/P once shock is stabilized.   Family at bedside - updated.   Additional cc time 75 minutes.   Durel Salts, MD Pulmonary and Critical Care Medicine [facility] 09/07/2021 1:44 PM Pager: see AMION  If no response to pager, please call critical care on call (see AMION) until 7pm After 7:00 pm call Elink

## 2021-09-07 NOTE — Progress Notes (Signed)
[facility] P[facility] interim evaluation note   S:  Called to bedside by eMD for refractory shock. No active bleeding . IR notes  noted - no recurrence of jejunal artey bleed though bedside CCM doc earlier raised possibilkty of RP bleed. ABD is not swelling and CTA today no RP bleed. Is febrile and with acute choley also appers septic  Did make 300cc  urine today buit creat climbing and K climbing  Objec HR 140s - sinus on EKG RASS -4 on vent with low dose fent gtt CTA bilaterlaly   LABS    PULMONARY Recent Labs  Lab 09/07/21 1505 09/07/21 1856 09/07/21 2016  PHART 7.318* 7.147* 7.291*  PCO2ART 42.6 50.7* 39.1  PO2ART 376* 88 84  HCO3 21.9 17.3* 18.6*  TCO2 23 19* 20*  O2SAT 100 92 94    CBC Recent Labs  Lab 09/06/21 0426 09/07/21 0613 09/07/21 1238 09/07/21 1845 09/07/21 1856 09/07/21 2016  HGB 7.6* 7.6*   < > 8.6* 7.5* 6.1*  HCT 22.8* 21.5*   < > 25.2* 22.0* 18.0*  WBC 12.8* 29.1*  --  4.1  --   --   PLT 183 125*  --  154  --   --    < > = values in this interval not displayed.    COAGULATION Recent Labs  Lab 09/05/21 0307 09/06/21 2240  INR 1.2 1.6*    CARDIAC  No results for input(s): "TROPONINI" in the last 168 hours. No results for input(s): "PROBNP" in the last 168 hours.   CHEMISTRY Recent Labs  Lab 09/04/21 1238 09/05/21 0307 09/06/21 2240 09/07/21 0613 09/07/21 1505 09/07/21 1856 09/07/21 2016  NA 137 138 137 136 134* 135 136  K 4.9 4.5 5.1 5.1 5.2* 5.3* 4.6  CL 106 111 110 109  --   --   --   CO2 24 24 19* 18*  --   --   --   GLUCOSE 193* 166* 338* 300*  --   --   --   BUN 28* 25* 24* 30*  --   --   --   CREATININE 1.23 1.00 1.77* 1.85*  --   --   --   CALCIUM 8.7* 7.6* 6.7* 6.9*  --   --   --   MG  --   --  1.4* 1.6*  --   --   --    Estimated Creatinine Clearance: 65 mL/min (A) (by C-G formula based on SCr of 1.85 mg/dL (H)).   LIVER Recent Labs  Lab 09/04/21 1238 09/05/21 0307 09/06/21 2240  AST 15  --  21  ALT 17  --   16  ALKPHOS 35*  --  21*  BILITOT 0.8  --  0.4  PROT 6.9  --  3.7*  ALBUMIN 3.7  --  1.8*  INR  --  1.2 1.6*     INFECTIOUS Recent Labs  Lab 09/07/21 0613 09/07/21 1238 09/07/21 1845  LATICACIDVEN 3.1* 7.8* 5.2*     ENDOCRINE CBG (last 3)  Recent Labs    09/07/21 1305 09/07/21 1522 09/07/21 1940  GLUCAP 405* 312* 253*         IMAGING x48h  - image(s) personally visualized  -   highlighted in bold CT ANGIO GI BLEED  Result Date: 09/07/2021 CLINICAL DATA:  Embolization for GI bleeding. Patient now with rebleeding. EXAM: CTA ABDOMEN AND PELVIS WITHOUT AND WITH CONTRAST TECHNIQUE: Multidetector CT imaging of the abdomen and pelvis was performed using the standard  protocol during bolus administration of intravenous contrast. Multiplanar reconstructed images and MIPs were obtained and reviewed to evaluate the vascular anatomy. RADIATION DOSE REDUCTION: This exam was performed according to the departmental dose-optimization program which includes automated exposure control, adjustment of the mA and/or kV according to patient size and/or use of iterative reconstruction technique. CONTRAST:  70mL OMNIPAQUE IOHEXOL 300 MG/ML  SOLN COMPARISON:  GI bleeding study, 09/06/2021. FINDINGS: VASCULAR Aorta: Mild atherosclerosis. No aneurysm or dissection. No significant stenosis. Celiac: Patent without evidence of aneurysm, dissection, vasculitis or significant stenosis. SMA: Patent without evidence of aneurysm, dissection, vasculitis or significant stenosis. Renals: Both renal arteries are patent without evidence of aneurysm, dissection, vasculitis, fibromuscular dysplasia or significant stenosis. IMA: Small with patent. Inflow: Patent without evidence of aneurysm, dissection, vasculitis or significant stenosis. Proximal Outflow: Bilateral common femoral and visualized portions of the superficial and profunda femoral arteries are patent without evidence of aneurysm, dissection, vasculitis or  significant stenosis. Veins: Unremarkable. Review of the MIP images confirms the above findings. NON-VASCULAR Lower chest: Dependent lung base atelectasis, new since the previous day's study. Previously noted small nodules are obscured. Hepatobiliary: Liver normal size. Decreased liver attenuation consistent with fatty infiltration. No mass or focal lesion. Gallbladder is distended. There are dependent gallstones. Air is seen in the non dependent gallbladder and along the anterior superior wall. There is hazy inflammatory change adjacent to the gallbladder and in the right upper quadrant, new compared to the previous day's CT. No bile duct dilation. Pancreas: Unremarkable. No pancreatic ductal dilatation or surrounding inflammatory changes. Spleen: Normal in size without focal abnormality. Adrenals/Urinary Tract: No adrenal masses. Kidneys normal in overall size, orientation and position with symmetric enhancement. Small intrarenal stones. Residual renal contrast. Right kidney midpole cyst, 1.5 cm. No follow-up recommended. No hydronephrosis. Normal ureters. Bladder decompressed with a Foley catheter. Stomach/Bowel: Previously seen arterial enhancement extravasation in the left mid abdomen, arising from a diverticulum along the jejunum, is no longer appreciated. The diverticulum is again noted as are several additional small bowel diverticula. There are no current areas of abnormal bowel enhancement or extravasation of contrast into the lumen to suggest an active GI bleeding source. Linear metal densities project adjacent to a loop of jejunum consistent with embolization coils. This is seen on axial image 50, series 19, coronal image 55, series 14. Nasogastric tube curls within the stomach. No stomach wall thickening or inflammation. Small bowel and colon are normal in caliber. There is no bowel wall thickening. No adjacent mesenteric inflammation. Enteric capsule now lies within the right colon. Lymphatic: No  enlarged lymph nodes. Reproductive: Unremarkable. Other: Trace ascites adjacent to the liver. Musculoskeletal: No fracture or acute finding.  No bone lesion. IMPRESSION: VASCULAR 1. Mild aortic atherosclerosis.  No aneurysm or dissection. 2. Aortic branch vessels are widely patent. NON-VASCULAR 1. 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 2. No current evidence of an active GI bleeding source. 3. Abnormal gallbladder including non dependent air, gallbladder wall air and pericholecystic and right upper quadrant inflammation developing since the previous day's CT. Findings consistent with acute cholecystitis. 4. Trace ascites, new since the previous day's CT. 5. New dependent lung base atelectasis. Electronically Signed   By: Lajean Manes M.D.   On: 09/07/2021 16:38   DG Abd Portable 1V  Result Date: 09/07/2021 CLINICAL DATA:  Nasogastric tube placement. EXAM: PORTABLE ABDOMEN - 1 VIEW COMPARISON:  None Available. FINDINGS: Distal tip of nasogastric tube  is seen within the stomach. IMPRESSION: Distal tip of nasogastric tube seen within the stomach. Electronically Signed   By: Marijo Conception M.D.   On: 09/07/2021 14:41   DG Chest Port 1 View  Result Date: 09/07/2021 CLINICAL DATA:  Intubation. EXAM: PORTABLE CHEST 1 VIEW COMPARISON:  Same day. FINDINGS: Endotracheal tube is in grossly good position. Distal tip of nasogastric tube is seen in the stomach. Right-sided PICC line is noted with distal tip in expected position of cavoatrial junction. Elevated right hemidiaphragm is noted with mild right basilar atelectasis. IMPRESSION: Endotracheal and nasogastric tubes are in grossly good position. Electronically Signed   By: Marijo Conception M.D.   On: 09/07/2021 14:40   DG Chest Port 1 View  Result Date: 09/07/2021 CLINICAL DATA:  61 year old male with bleeding small bowel diverticula. Hypoxia. EXAM: PORTABLE CHEST 1 VIEW COMPARISON:   Portable chest 09/06/2021 and earlier. FINDINGS: Portable AP semi upright view at 0954 hours. Very low lung volumes now. There is a right side central venous catheter. Stable cardiac size and mediastinal contours. Allowing for portable technique the lungs are clear. Visualized tracheal air column is within normal limits. Visible bowel-gas pattern within normal limits. No acute osseous abnormality identified. IMPRESSION: Very Low lung volumes.  No acute cardiopulmonary abnormality. Electronically Signed   By: Genevie Ann M.D.   On: 09/07/2021 10:03   [place] EKG SITE RITE  Result Date: 09/06/2021 If Site Rite image not attached, placement could not be confirmed due to current cardiac rhythm.  DG CHEST PORT 1 VIEW  Result Date: 09/06/2021 CLINICAL DATA:  Shortness of breath. EXAM: PORTABLE CHEST 1 VIEW COMPARISON:  October 10, 2004. FINDINGS: Stable cardiomediastinal silhouette. Lungs are clear. Mildly elevated right hemidiaphragm is noted. Bony thorax is unremarkable. IMPRESSION: No active disease. Electronically Signed   By: Marijo Conception M.D.   On: 09/06/2021 16:37   CT ANGIO GI BLEED  Addendum Date: 09/06/2021   ADDENDUM REPORT: 09/06/2021 15:13 ADDENDUM: Critical Value/emergent results were called by telephone at the time of interpretation on 09/06/2021 at 3:12 pm to provider [facility] , who verbally acknowledged these results. Electronically Signed   By: Misty Stanley M.D.   On: 09/06/2021 15:13   Result Date: 09/06/2021 CLINICAL DATA:  Rectal bleeding. EXAM: CTA ABDOMEN AND PELVIS WITHOUT AND WITH CONTRAST TECHNIQUE: Multidetector CT imaging of the abdomen and pelvis was performed using the standard protocol during bolus administration of intravenous contrast. Multiplanar reconstructed images and MIPs were obtained and reviewed to evaluate the vascular anatomy. RADIATION DOSE REDUCTION: This exam was performed according to the departmental dose-optimization program which includes automated  exposure control, adjustment of the mA and/or kV according to patient size and/or use of iterative reconstruction technique. CONTRAST:  119mL OMNIPAQUE IOHEXOL 350 MG/ML SOLN COMPARISON:  09/05/2021 FINDINGS: VASCULAR Aorta: Normal caliber aorta without aneurysm, dissection, vasculitis or significant stenosis. Mild atherosclerotic calcification noted. Celiac: Patent without evidence of aneurysm, dissection, vasculitis or significant stenosis. Mass-effect on the celiac origin likely secondary to compression by the median arcuate ligament. SMA: Patent without evidence of aneurysm, dissection, vasculitis or significant stenosis. Replaced right hepatic artery. Renals: Both renal arteries are patent without evidence of aneurysm, dissection, vasculitis, fibromuscular dysplasia or significant stenosis. Accessory left renal artery noted. 2 accessory renal arteries noted right kidney, 1 of which arises from the right common iliac artery. IMA: Patent without evidence of aneurysm, dissection, vasculitis or significant stenosis. Inflow: Patent without evidence of aneurysm, dissection, vasculitis or significant stenosis.  Proximal Outflow: Bilateral common femoral and visualized portions of the superficial and profunda femoral arteries are patent without evidence of aneurysm, dissection, vasculitis or significant stenosis. Veins: No obvious venous abnormality within the limitations of this arterial phase study. Review of the MIP images confirms the above findings. NON-VASCULAR Lower chest: Tiny pulmonary nodules again identified measuring up to 4 mm. Hepatobiliary: No suspicious focal abnormality within the liver parenchyma. Small calcified gallstones evident. No intrahepatic or extrahepatic biliary dilation. Pancreas: No focal mass lesion. No dilatation of the main duct. No intraparenchymal cyst. No peripancreatic edema. Spleen: No splenomegaly. No focal mass lesion. Adrenals/Urinary Tract: No adrenal nodule or mass. Tiny  nonobstructing stones are noted in both kidneys. Small cyst noted lower interpolar right kidney. Left kidney unremarkable. No evidence for hydroureter. The urinary bladder appears normal for the degree of distention. Stomach/Bowel: Stomach is unremarkable. No gastric wall thickening. No evidence of outlet obstruction. Duodenum is normally positioned as is the ligament of Treitz. Duodenal diverticuli noted. No small bowel wall thickening. No small bowel dilatation. Active arterial phase bleeding is identified in in a small bowel loop of the left abdomen. Bleeding appears to arise in a diverticulum (axial image 96 series 9) with layering of extravasated contrast dependently within the bowel lumen. Apparent diverticulum also well demonstrated on coronal image 64/11 with additional small bowel diverticuli seen in the same region (also coronal 64/11). There is a capsule endoscope in the proximal duodenum on precontrast imaging but has migrated into the proximal jejunum by postcontrast imaging. The terminal ileum is normal. The appendix is normal. No gross colonic mass. No colonic wall thickening. Lymphatic: There is no gastrohepatic or hepatoduodenal ligament lymphadenopathy. No retroperitoneal or mesenteric lymphadenopathy. No pelvic sidewall lymphadenopathy. Reproductive: The prostate gland and seminal vesicles are unremarkable. Other: No intraperitoneal free fluid. Musculoskeletal: No worrisome lytic or sclerotic osseous abnormality. Ventral mesh evident. There is a paraumbilical ventral hernia containing only fat. IMPRESSION: VASCULAR 1. Active arterial phase extravasation identified in a small bowel loop of the left upper quadrant, likely jejunal. The source of the bleeding appears to be a diverticulum and multiple additional diverticuli are seen in the duodenum and proximal small bowel. 2. Abdominal aortic atherosclerosis without aneurysm. NON-VASCULAR 1. Capsule endoscope identified in the duodenum on precontrast  imaging migrates into proximal small bowel by postcontrast series. 2. Cholelithiasis. 3. Bilateral nonobstructing nephrolithiasis. 4. Duodenal and jejunal diverticuli. 5. Tiny pulmonary nodules again noted in both lung bases measuring up to 4 mm. No follow-up needed if patient is low-risk (and has no known or suspected primary neoplasm). Non-contrast chest CT can be considered in 12 months if patient is high-risk. This recommendation follows the consensus statement: Guidelines for Management of Incidental Pulmonary Nodules Detected on CT Images: From the Fleischner Society 2017; Radiology 2017; 284:228-243. 6. Paraumbilical anterior abdominal wall hernia containing only fat in this patient status post ventral mesh placement. Hernia may be along the inferior margin of the existing mash. Electronically Signed: By: Kennith Center M.D. On: 09/06/2021 15:06      A) MODS Refractory shock  Plan  - continue pressors  - give prbc for goal hgb > 7 - start bic gtt  -start scheduled lokelma  - d/w elink - expand zosyn to merrem - heading towards CRRT - stat labs  Family of wife, son, daughter at bedside updated    Interdisciplinary Goals of Care Family Meeting   Date carried out: 09/07/2021  Location of the meeting: Bedside  Member's involved: Physician, Bedside Registered  Nurse, Family Member or next of kin, and Other: son, daughter, wife  Durable Power of Forensic psychologist or Loss adjuster, chartered: wife    Discussion: We discussed goals of care for [employer] .  MOD  Code status: Full Code  Disposition: Continue current acute care  Time spent for the meeting: 36 ,oi    Time spent in critical care 45 min   SIGNATURE    Dr. Brand Males, M.D., F.C.C.P,  Pulmonary and Critical Care Medicine Staff Physician, [place] Director - Interstitial Lung Disease  Program  Medical Director - [employer] ICU Pul[facility] [place] at  [place], [place], [address]  NPI Number:  NPI [id number] [facility] Number: [id number]  Pager: [phone], If no answer  -[place] or [place] [phone] Telephone (clinical office): [phone] Telephone (research): [phone]  9:26 PM 09/07/2021

## 2021-09-07 NOTE — Progress Notes (Signed)
Pt transported to CT and to IR. Pt then transported back to 3M07 without complications. RT will monitor.

## 2021-09-07 NOTE — Progress Notes (Signed)
Peripherally Inserted Central Catheter Placement  The IV Nurse has discussed with the patient and/or persons authorized to consent for the patient, the purpose of this procedure and the potential benefits and risks involved with this procedure.  The benefits include less needle sticks, lab draws from the catheter, and the patient may be discharged home with the catheter. Risks include, but not limited to, infection, bleeding, blood clot (thrombus formation), and puncture of an artery; nerve damage and irregular heartbeat and possibility to perform a PICC exchange if needed/ordered by physician.  Alternatives to this procedure were also discussed.  Bard Power PICC patient education guide, fact sheet on infection prevention and patient information card has been provided to patient /or left at bedside.    PICC Placement Documentation  PICC Triple Lumen 09/07/21 Right Cephalic 42 cm 1 cm (Active)  Indication for Insertion or Continuance of Line Poor Vasculature-patient has had multiple peripheral attempts or PIVs lasting less than 24 hours 09/07/21 0900  Exposed Catheter (cm) 1 cm 09/07/21 0900  Site Assessment Clean, Dry, Intact 09/07/21 0900  Lumen #1 Status Saline locked;Flushed;Blood return noted 09/07/21 0900  Lumen #2 Status Saline locked;Flushed;Blood return noted 09/07/21 0900  Lumen #3 Status Saline locked;Flushed;Blood return noted 09/07/21 0900  Dressing Type Transparent;Securing device 09/07/21 0900  Dressing Status Antimicrobial disc in place;Clean, Dry, Intact 09/07/21 0900  Safety Lock Not Applicable 09/07/21 0900  Line Care Connections checked and tightened 09/07/21 0900  Line Adjustment (NICU/IV Team Only) No 09/07/21 0900  Dressing Intervention New dressing 09/07/21 0900  Dressing Change Due 09/14/21 09/07/21 0900       Burnard Bunting Chenice 09/07/2021, 9:16 AM

## 2021-09-07 NOTE — Progress Notes (Signed)
Pharmacy Electrolyte Replacement  Recent Labs:  Recent Labs    09/07/21 0613  K 5.1  MG 1.6*  CREATININE 1.85*   Corrected calcium- 8.6, in the setting of 4 transfused units   Low Critical Values (K </= 2.5, Phos </= 1, Mg </= 1) Present: None  Plan: Give 1x 4g magnesium recheck with AM labs, give 1g calcium gluconate, check ical with Am labs

## 2021-09-07 NOTE — Progress Notes (Signed)
Pharmacy Antibiotic Note  Troy Randall is a 61 y.o. male  with cholecystitis. He is noted s/p Percutaneous cholecysteostomy tube placement Pharmacy has been consulted for meropenem dosing. -WBC= 29.1, tmax= 101.5, SCr= 1.85  Plan: -Meropenem 2gm IV q8h -Will follow renal function, cultures and clinical progress   Height: 6' (182.9 cm) Weight: (!) 147.5 kg (325 lb 2.9 oz) IBW/kg (Calculated) : 77.6  Temp (24hrs), Avg:99.6 F (37.6 C), Min:97.2 F (36.2 C), Max:101.5 F (38.6 C)  Recent Labs  Lab 09/04/21 1238 09/05/21 0307 09/05/21 1112 09/05/21 2000 09/06/21 0426 09/06/21 2240 09/07/21 0613 09/07/21 1238 09/07/21 1845  WBC 11.8* 8.5 8.5 10.4 12.8*  --  29.1*  --  4.1  CREATININE 1.23 1.00  --   --   --  1.77* 1.85*  --   --   LATICACIDVEN  --   --   --   --   --  6.9* 3.1* 7.8* 5.2*     Estimated Creatinine Clearance: 65 mL/min (A) (by C-G formula based on SCr of 1.85 mg/dL (H)).    Allergies  Allergen Reactions   Metformin And Related     GI upset   Penicillins Hives    Thank you for allowing pharmacy to be a part of this patient's care.  Harland German, PharmD Clinical Pharmacist **Pharmacist phone directory can now be found on [email or web address] ([name]).  Listed under [facility].

## 2021-09-07 NOTE — Progress Notes (Signed)
Pharmacy Antibiotic Note  Troy Randall is a 61 y.o. male  with cholecystitis. He is noted s/p Percutaneous cholecysteostomy tube placement Pharmacy has been consulted for zosyn dosing. -WBC= 29.1, tmax= 101.5, SCr= 1.85  Plan: -Zosyn 3.375gm IV q8h -Will follow renal function, cultures and clinical progress   Height: 6' (182.9 cm) Weight: (!) 147.5 kg (325 lb 2.9 oz) IBW/kg (Calculated) : 77.6  Temp (24hrs), Avg:99.1 F (37.3 C), Min:97.2 F (36.2 C), Max:101.5 F (38.6 C)  Recent Labs  Lab 09/04/21 1238 09/05/21 0307 09/05/21 1112 09/05/21 2000 09/06/21 0426 09/06/21 2240 09/07/21 0613 09/07/21 1238  WBC 11.8* 8.5 8.5 10.4 12.8*  --  29.1*  --   CREATININE 1.23 1.00  --   --   --  1.77* 1.85*  --   LATICACIDVEN  --   --   --   --   --  6.9* 3.1* 7.8*    Estimated Creatinine Clearance: 65 mL/min (A) (by C-G formula based on SCr of 1.85 mg/dL (H)).    Allergies  Allergen Reactions   Metformin And Related     GI upset   Penicillins Hives    Thank you for allowing pharmacy to be a part of this patient's care.  Harland German, PharmD Clinical Pharmacist **Pharmacist phone directory can now be found on [email or web address] ([name]).  Listed under [facility].

## 2021-09-08 ENCOUNTER — Inpatient Hospital Stay (HOSPITAL_COMMUNITY): Payer: Medicare HMO

## 2021-09-08 DIAGNOSIS — I10 Essential (primary) hypertension: Secondary | ICD-10-CM | POA: Diagnosis not present

## 2021-09-08 DIAGNOSIS — A419 Sepsis, unspecified organism: Secondary | ICD-10-CM | POA: Diagnosis not present

## 2021-09-08 DIAGNOSIS — N170 Acute kidney failure with tubular necrosis: Secondary | ICD-10-CM | POA: Diagnosis not present

## 2021-09-08 DIAGNOSIS — N179 Acute kidney failure, unspecified: Secondary | ICD-10-CM

## 2021-09-08 DIAGNOSIS — D62 Acute posthemorrhagic anemia: Secondary | ICD-10-CM | POA: Diagnosis not present

## 2021-09-08 DIAGNOSIS — R6521 Severe sepsis with septic shock: Secondary | ICD-10-CM | POA: Diagnosis not present

## 2021-09-08 DIAGNOSIS — K810 Acute cholecystitis: Secondary | ICD-10-CM | POA: Diagnosis not present

## 2021-09-08 DIAGNOSIS — E1110 Type 2 diabetes mellitus with ketoacidosis without coma: Secondary | ICD-10-CM | POA: Diagnosis not present

## 2021-09-08 DIAGNOSIS — K5791 Diverticulosis of intestine, part unspecified, without perforation or abscess with bleeding: Secondary | ICD-10-CM | POA: Diagnosis not present

## 2021-09-08 DIAGNOSIS — R579 Shock, unspecified: Secondary | ICD-10-CM

## 2021-09-08 DIAGNOSIS — E1165 Type 2 diabetes mellitus with hyperglycemia: Secondary | ICD-10-CM | POA: Diagnosis not present

## 2021-09-08 DIAGNOSIS — J9601 Acute respiratory failure with hypoxia: Secondary | ICD-10-CM | POA: Diagnosis not present

## 2021-09-08 DIAGNOSIS — J9811 Atelectasis: Secondary | ICD-10-CM | POA: Diagnosis not present

## 2021-09-08 DIAGNOSIS — J90 Pleural effusion, not elsewhere classified: Secondary | ICD-10-CM | POA: Diagnosis not present

## 2021-09-08 DIAGNOSIS — R578 Other shock: Secondary | ICD-10-CM | POA: Diagnosis not present

## 2021-09-08 DIAGNOSIS — K922 Gastrointestinal hemorrhage, unspecified: Secondary | ICD-10-CM | POA: Diagnosis not present

## 2021-09-08 LAB — CBC
HCT: 18.6 % — ABNORMAL LOW (ref 39.0–52.0)
HCT: 19.8 % — ABNORMAL LOW (ref 39.0–52.0)
HCT: 21.8 % — ABNORMAL LOW (ref 39.0–52.0)
HCT: 21.8 % — ABNORMAL LOW (ref 39.0–52.0)
HCT: 22.2 % — ABNORMAL LOW (ref 39.0–52.0)
Hemoglobin: 6.9 g/dL — CL (ref 13.0–17.0)
Hemoglobin: 7.1 g/dL — ABNORMAL LOW (ref 13.0–17.0)
Hemoglobin: 7.5 g/dL — ABNORMAL LOW (ref 13.0–17.0)
Hemoglobin: 7.6 g/dL — ABNORMAL LOW (ref 13.0–17.0)
Hemoglobin: 7.7 g/dL — ABNORMAL LOW (ref 13.0–17.0)
MCH: 30.5 pg (ref 26.0–34.0)
MCH: 30.6 pg (ref 26.0–34.0)
MCH: 30.9 pg (ref 26.0–34.0)
MCH: 31 pg (ref 26.0–34.0)
MCH: 32.4 pg (ref 26.0–34.0)
MCHC: 34.4 g/dL (ref 30.0–36.0)
MCHC: 34.7 g/dL (ref 30.0–36.0)
MCHC: 34.9 g/dL (ref 30.0–36.0)
MCHC: 35.9 g/dL (ref 30.0–36.0)
MCHC: 37.1 g/dL — ABNORMAL HIGH (ref 30.0–36.0)
MCV: 86.1 fL (ref 80.0–100.0)
MCV: 87.3 fL (ref 80.0–100.0)
MCV: 88.1 fL (ref 80.0–100.0)
MCV: 88.6 fL (ref 80.0–100.0)
MCV: 89 fL (ref 80.0–100.0)
Platelets: 106 10*3/uL — ABNORMAL LOW (ref 150–400)
Platelets: 122 10*3/uL — ABNORMAL LOW (ref 150–400)
Platelets: 131 10*3/uL — ABNORMAL LOW (ref 150–400)
Platelets: 133 10*3/uL — ABNORMAL LOW (ref 150–400)
Platelets: 141 10*3/uL — ABNORMAL LOW (ref 150–400)
RBC: 2.13 MIL/uL — ABNORMAL LOW (ref 4.22–5.81)
RBC: 2.3 MIL/uL — ABNORMAL LOW (ref 4.22–5.81)
RBC: 2.45 MIL/uL — ABNORMAL LOW (ref 4.22–5.81)
RBC: 2.46 MIL/uL — ABNORMAL LOW (ref 4.22–5.81)
RBC: 2.52 MIL/uL — ABNORMAL LOW (ref 4.22–5.81)
RDW: 14.4 % (ref 11.5–15.5)
RDW: 14.6 % (ref 11.5–15.5)
RDW: 14.8 % (ref 11.5–15.5)
RDW: 14.9 % (ref 11.5–15.5)
RDW: 15 % (ref 11.5–15.5)
WBC: 10.7 10*3/uL — ABNORMAL HIGH (ref 4.0–10.5)
WBC: 21.3 10*3/uL — ABNORMAL HIGH (ref 4.0–10.5)
WBC: 28.7 10*3/uL — ABNORMAL HIGH (ref 4.0–10.5)
WBC: 30.7 10*3/uL — ABNORMAL HIGH (ref 4.0–10.5)
WBC: 33.6 10*3/uL — ABNORMAL HIGH (ref 4.0–10.5)
nRBC: 1.6 % — ABNORMAL HIGH (ref 0.0–0.2)
nRBC: 1.9 % — ABNORMAL HIGH (ref 0.0–0.2)
nRBC: 2.7 % — ABNORMAL HIGH (ref 0.0–0.2)
nRBC: 4.4 % — ABNORMAL HIGH (ref 0.0–0.2)
nRBC: 6.5 % — ABNORMAL HIGH (ref 0.0–0.2)

## 2021-09-08 LAB — POCT I-STAT 7, (LYTES, BLD GAS, ICA,H+H)
Acid-base deficit: 6 mmol/L — ABNORMAL HIGH (ref 0.0–2.0)
Acid-base deficit: 5 mmol/L — ABNORMAL HIGH (ref 0.0–2.0)
Acid-base deficit: 5 mmol/L — ABNORMAL HIGH (ref 0.0–2.0)
Bicarbonate: 19.7 mmol/L — ABNORMAL LOW (ref 20.0–28.0)
Bicarbonate: 20.2 mmol/L (ref 20.0–28.0)
Bicarbonate: 19.9 mmol/L — ABNORMAL LOW (ref 20.0–28.0)
Calcium, Ion: 1.05 mmol/L — ABNORMAL LOW (ref 1.15–1.40)
Calcium, Ion: 1.03 mmol/L — ABNORMAL LOW (ref 1.15–1.40)
Calcium, Ion: 0.95 mmol/L — ABNORMAL LOW (ref 1.15–1.40)
HCT: 19 % — ABNORMAL LOW (ref 39.0–52.0)
HCT: 18 % — ABNORMAL LOW (ref 39.0–52.0)
HCT: 17 % — ABNORMAL LOW (ref 39.0–52.0)
Hemoglobin: 6.5 g/dL — CL (ref 13.0–17.0)
Hemoglobin: 6.1 g/dL — CL (ref 13.0–17.0)
Hemoglobin: 5.8 g/dL — CL (ref 13.0–17.0)
O2 Saturation: 92 %
O2 Saturation: 95 %
O2 Saturation: 99 %
Patient temperature: 37.7
Patient temperature: 37.3
Patient temperature: 37.5
Potassium: 3.7 mmol/L (ref 3.5–5.1)
Potassium: 4.8 mmol/L (ref 3.5–5.1)
Potassium: 5 mmol/L (ref 3.5–5.1)
Sodium: 137 mmol/L (ref 135–145)
Sodium: 135 mmol/L (ref 135–145)
Sodium: 136 mmol/L (ref 135–145)
TCO2: 21 mmol/L — ABNORMAL LOW (ref 22–32)
TCO2: 21 mmol/L — ABNORMAL LOW (ref 22–32)
TCO2: 21 mmol/L — ABNORMAL LOW (ref 22–32)
pCO2 arterial: 40.1 mmHg (ref 32–48)
pCO2 arterial: 39.9 mmHg (ref 32–48)
pCO2 arterial: 36.1 mmHg (ref 32–48)
pH, Arterial: 7.303 — ABNORMAL LOW (ref 7.35–7.45)
pH, Arterial: 7.313 — ABNORMAL LOW (ref 7.35–7.45)
pH, Arterial: 7.353 (ref 7.35–7.45)
pO2, Arterial: 74 mmHg — ABNORMAL LOW (ref 83–108)
pO2, Arterial: 82 mmHg — ABNORMAL LOW (ref 83–108)
pO2, Arterial: 123 mmHg — ABNORMAL HIGH (ref 83–108)

## 2021-09-08 LAB — HEMOGLOBIN AND HEMATOCRIT, BLOOD
HCT: 27.3 % — ABNORMAL LOW (ref 39.0–52.0)
Hemoglobin: 9.2 g/dL — ABNORMAL LOW (ref 13.0–17.0)

## 2021-09-08 LAB — LACTIC ACID, PLASMA
Lactic Acid, Venous: 3 mmol/L (ref 0.5–1.9)
Lactic Acid, Venous: 3.5 mmol/L (ref 0.5–1.9)

## 2021-09-08 LAB — RENAL FUNCTION PANEL
Albumin: <1.5 g/dL — ABNORMAL LOW (ref 3.5–5.0)
Anion gap: 8 (ref 5–15)
BUN: 48 mg/dL — ABNORMAL HIGH (ref 6–20)
CO2: 19 mmol/L — ABNORMAL LOW (ref 22–32)
Calcium: 6 mg/dL — CL (ref 8.9–10.3)
Chloride: 110 mmol/L (ref 98–111)
Creatinine, Ser: 2.98 mg/dL — ABNORMAL HIGH (ref 0.61–1.24)
GFR, Estimated: 24 mL/min — ABNORMAL LOW (ref >60)
Glucose, Bld: 228 mg/dL — ABNORMAL HIGH (ref 70–99)
Phosphorus: 5.1 mg/dL — ABNORMAL HIGH (ref 2.5–4.6)
Potassium: 5 mmol/L (ref 3.5–5.1)
Sodium: 137 mmol/L (ref 135–145)

## 2021-09-08 LAB — MAGNESIUM
Magnesium: 1.8 mg/dL (ref 1.7–2.4)
Magnesium: 1.9 mg/dL (ref 1.7–2.4)

## 2021-09-08 LAB — PREPARE RBC (CROSSMATCH)

## 2021-09-08 LAB — BASIC METABOLIC PANEL
Anion gap: 9 (ref 5–15)
Anion gap: 7 (ref 5–15)
BUN: 44 mg/dL — ABNORMAL HIGH (ref 6–20)
BUN: 46 mg/dL — ABNORMAL HIGH (ref 6–20)
CO2: 19 mmol/L — ABNORMAL LOW (ref 22–32)
CO2: 20 mmol/L — ABNORMAL LOW (ref 22–32)
Calcium: 6.4 mg/dL — CL (ref 8.9–10.3)
Calcium: 6.5 mg/dL — ABNORMAL LOW (ref 8.9–10.3)
Chloride: 109 mmol/L (ref 98–111)
Chloride: 109 mmol/L (ref 98–111)
Creatinine, Ser: 3.61 mg/dL — ABNORMAL HIGH (ref 0.61–1.24)
Creatinine, Ser: 3.57 mg/dL — ABNORMAL HIGH (ref 0.61–1.24)
GFR, Estimated: 19 mL/min — ABNORMAL LOW (ref >60)
GFR, Estimated: 19 mL/min — ABNORMAL LOW (ref >60)
Glucose, Bld: 172 mg/dL — ABNORMAL HIGH (ref 70–99)
Glucose, Bld: 176 mg/dL — ABNORMAL HIGH (ref 70–99)
Potassium: 3.6 mmol/L (ref 3.5–5.1)
Potassium: 4.3 mmol/L (ref 3.5–5.1)
Sodium: 137 mmol/L (ref 135–145)
Sodium: 136 mmol/L (ref 135–145)

## 2021-09-08 LAB — PHOSPHORUS: Phosphorus: 4.4 mg/dL (ref 2.5–4.6)

## 2021-09-08 LAB — GLUCOSE, CAPILLARY
Glucose-Capillary: 156 mg/dL — ABNORMAL HIGH (ref 70–99)
Glucose-Capillary: 163 mg/dL — ABNORMAL HIGH (ref 70–99)
Glucose-Capillary: 174 mg/dL — ABNORMAL HIGH (ref 70–99)
Glucose-Capillary: 203 mg/dL — ABNORMAL HIGH (ref 70–99)
Glucose-Capillary: 203 mg/dL — ABNORMAL HIGH (ref 70–99)
Glucose-Capillary: 188 mg/dL — ABNORMAL HIGH (ref 70–99)

## 2021-09-08 MED ORDER — SODIUM CHLORIDE 0.9% IV SOLUTION: Freq: Once | INTRAVENOUS | Status: AC

## 2021-09-08 MED ORDER — PRISMASOL BGK 4/2.5 32-4-2.5 MEQ/L REPLACEMENT SOLN
Status: DC
  Filled 2021-09-08 (×12): qty 5000

## 2021-09-08 MED ORDER — DOCUSATE SODIUM 50 MG/5ML PO LIQD: 100.0000 mg | Freq: Two times a day (BID) | ORAL | Status: DC | PRN

## 2021-09-08 MED ORDER — INSULIN GLARGINE-YFGN 100 UNIT/ML ~~LOC~~ SOLN
15.0000 [IU] | Freq: Every day | SUBCUTANEOUS | Status: DC
  Filled 2021-09-08: qty 0.15

## 2021-09-08 MED ORDER — ORAL CARE MOUTH RINSE
15.0000 mL | OROMUCOSAL | Status: DC
  Administered 2021-09-08 – 2021-10-05 (×307): 15 mL via OROMUCOSAL

## 2021-09-08 MED ORDER — PRISMASOL BGK 4/2.5 32-4-2.5 MEQ/L EC SOLN
Status: DC
  Filled 2021-09-08 (×40): qty 5000

## 2021-09-08 MED ORDER — POLYETHYLENE GLYCOL 3350 17 G PO PACK: 17.0000 g | PACK | Freq: Every day | ORAL | Status: DC | PRN

## 2021-09-08 MED ORDER — SODIUM CHLORIDE 0.9 % IV SOLN: INTRAVENOUS | Status: DC

## 2021-09-08 MED ORDER — LACTATED RINGERS IV SOLN: INTRAVENOUS | Status: DC

## 2021-09-08 MED ORDER — INSULIN ASPART 100 UNIT/ML IJ SOLN
0.0000 [IU] | INTRAMUSCULAR | Status: DC
  Administered 2021-09-08: 7 [IU] via SUBCUTANEOUS
  Administered 2021-09-08 (×2): 4 [IU] via SUBCUTANEOUS
  Administered 2021-09-08: 7 [IU] via SUBCUTANEOUS
  Administered 2021-09-09 (×3): 4 [IU] via SUBCUTANEOUS
  Administered 2021-09-09: 7 [IU] via SUBCUTANEOUS
  Administered 2021-09-09: 4 [IU] via SUBCUTANEOUS
  Administered 2021-09-09: 7 [IU] via SUBCUTANEOUS
  Administered 2021-09-10: 11 [IU] via SUBCUTANEOUS

## 2021-09-08 MED ORDER — PANTOPRAZOLE 2 MG/ML SUSPENSION
40.0000 mg | Freq: Every day | ORAL | Status: DC
  Administered 2021-09-08 – 2021-09-09 (×2): 40 mg
  Filled 2021-09-08 (×2): qty 20

## 2021-09-08 MED ORDER — ORAL CARE MOUTH RINSE: 15.0000 mL | OROMUCOSAL | Status: DC | PRN

## 2021-09-08 MED ORDER — SODIUM CHLORIDE 0.9% FLUSH
5.0000 mL | Freq: Three times a day (TID) | INTRAVENOUS | Status: DC
  Administered 2021-09-08 – 2021-10-09 (×88): 5 mL
  Administered 2021-10-09: 10 mL
  Administered 2021-10-09 – 2021-10-26 (×47): 5 mL

## 2021-09-08 MED ORDER — SODIUM CHLORIDE 0.9 % IV SOLN
INTRAVENOUS | Status: DC | PRN
  Administered 2021-10-07: 10 mL/h via INTRAVENOUS

## 2021-09-08 MED ORDER — HEPARIN SODIUM (PORCINE) 1000 UNIT/ML IJ SOLN
INTRAMUSCULAR | Status: AC
  Filled 2021-09-08: qty 3

## 2021-09-08 MED ORDER — CALCIUM GLUCONATE-NACL 1-0.675 GM/50ML-% IV SOLN
1.0000 g | Freq: Once | INTRAVENOUS | Status: AC
Start: 2021-09-08 — End: 2021-09-08
  Administered 2021-09-08: 1000 mg via INTRAVENOUS
  Filled 2021-09-08: qty 50

## 2021-09-08 MED ORDER — HEPARIN SODIUM (PORCINE) 1000 UNIT/ML DIALYSIS
1000.0000 [IU] | INTRAMUSCULAR | Status: DC | PRN
  Administered 2021-09-08: 2400 [IU] via INTRAVENOUS_CENTRAL
  Filled 2021-09-08: qty 6

## 2021-09-08 MED ORDER — CALCIUM GLUCONATE-NACL 2-0.675 GM/100ML-% IV SOLN
2.0000 g | Freq: Once | INTRAVENOUS | Status: AC
Start: 2021-09-08 — End: 2021-09-08
  Administered 2021-09-08: 2000 mg via INTRAVENOUS
  Filled 2021-09-08: qty 100

## 2021-09-08 MED ORDER — HYDROCORTISONE SOD SUC (PF) 100 MG IJ SOLR
100.0000 mg | Freq: Two times a day (BID) | INTRAMUSCULAR | Status: DC
Start: 2021-09-08 — End: 2021-09-11
  Administered 2021-09-08 – 2021-09-11 (×7): 100 mg via INTRAVENOUS
  Filled 2021-09-08 (×7): qty 2

## 2021-09-08 NOTE — Progress Notes (Signed)
Supervising Physician: Roanna Banning  Patient Status:  [facility] - In-pt  Chief Complaint:  GI bleed and cholecystitis; 2 days s/p coil embolization with [place]; 1 day s/p angiogram and percutaneous cholecystotomy tube   Subjective:  Pt intubated and resting, does not respond to voice, Wife at bedside Report of 1 maroon BM overnight Febrile, mildly tachy, and requiring some pressors. WBC  30K (from 10.7K yesterday)  Allergies: Metformin and related and Penicillins  Medications: Prior to Admission medications   Medication Sig Start Date End Date Taking? Authorizing Provider  amLODipine (NORVASC) 10 MG tablet Take 1 tablet (10 mg total) by mouth daily. 06/15/17  Yes Aliene Beams, MD  aspirin EC 81 MG tablet Take 81 mg by mouth daily. Swallow whole.   Yes [provider]  clopidogrel (PLAVIX) 75 MG tablet Take 1 tablet (75 mg total) by mouth daily. 04/30/21  Yes Azucena Fallen, MD  glipiZIDE (GLUCOTROL XL) 10 MG 24 hr tablet Take 1 tablet (10 mg total) by mouth daily with breakfast. 04/16/17  Yes Hagler, Fleet Contras, MD  levocetirizine (XYZAL) 5 MG tablet Take 1 tablet by mouth at bedtime.   Yes [provider]  lisinopril (PRINIVIL,ZESTRIL) 40 MG tablet Take 1 tablet (40 mg total) by mouth daily. 05/12/17  Yes Aliene Beams, MD  metFORMIN (GLUCOPHAGE-XR) 500 MG 24 hr tablet Take 500-1,000 mg by mouth in the morning and at bedtime. 02/09/21  Yes [provider]  rosuvastatin (CRESTOR) 40 MG tablet Take 1 tablet (40 mg total) by mouth daily. 04/29/21  Yes Azucena Fallen, MD  tamsulosin (FLOMAX) 0.4 MG CAPS capsule Take 0.4 mg by mouth daily. 02/12/21  Yes [provider]  traZODone (DESYREL) 150 MG tablet Take 150 mg by mouth at bedtime. 02/04/21  Yes [provider]  TRESIBA FLEXTOUCH 200 UNIT/ML FlexTouch Pen Inject 20 Units into the skin in the morning and at bedtime. 03/14/21  Yes [provider]  BD VEO INSULIN SYRINGE U/F 31G X  15/64" 1 ML MISC  USE AS DIRECTED 06/08/17   Aliene Beams, MD  glucose blood (ONETOUCH VERIO) test strip TEST twice a day 05/04/17   Aliene Beams, MD  Insulin Pen Needle (NOVOTWIST) 32G X 5 MM MISC Use two daily to inject Victoza and Toujeo. 04/25/15   Reather Littler, MD  [facility] DELICA LANCETS 33G MISC Use to check blood sugar once a day dx code E11.65 11/21/14   Reather Littler, MD     Vital Signs: BP 99/76   Pulse (!) 113   Temp 100 F (37.8 C)   Resp (!) 22   Ht 6' (1.829 m)   Wt (!) 334 lb 10.5 oz (151.8 kg)   SpO2 98%   BMI 45.39 kg/m   Physical Exam Constitutional:      General: He is sleeping.     Appearance: He is ill-appearing. He is not diaphoretic.     Interventions: He is intubated.  Cardiovascular:     Comments: Distal pulses palpably strong Pulmonary:     Effort: He is intubated.  Abdominal:     Palpations: Abdomen is soft.  Skin:    General: Skin is cool and dry.     Coloration: Skin is pale.     Comments: Right CFA site soft, without bleeding, without pseudoaneurysm   Drain Location: RUQ Size: Fr size: 10 Fr Date of placement: 09/07/21  Currently to: Drain collection device: gravity 24 hour output:  Output by Drain (mL) 09/06/21 0701 - 09/06/21  1900 09/06/21 1901 - 09/07/21 0700 09/07/21 0701 - 09/07/21 1900 09/07/21 1901 - 09/08/21 0700 09/08/21 0701 - 09/08/21 1002  Closed System Drain 1 Lateral RUQ Other (Comment) 10.2 Fr.    375     Current examination: Flushes/aspirates easily.  Insertion site unremarkable. Suture and stat lock in place. Dressed appropriately.   Imaging: CT ANGIO GI BLEED  Result Date: 09/07/2021 CLINICAL DATA:  Embolization for GI bleeding. Patient now with rebleeding. EXAM: CTA ABDOMEN AND PELVIS WITHOUT AND WITH CONTRAST TECHNIQUE: Multidetector CT imaging of the abdomen and pelvis was performed using the standard protocol during bolus administration of intravenous contrast. Multiplanar reconstructed images and MIPs were obtained  and reviewed to evaluate the vascular anatomy. RADIATION DOSE REDUCTION: This exam was performed according to the departmental dose-optimization program which includes automated exposure control, adjustment of the mA and/or kV according to patient size and/or use of iterative reconstruction technique. CONTRAST:  39mL OMNIPAQUE IOHEXOL 300 MG/ML  SOLN COMPARISON:  GI bleeding study, 09/06/2021. FINDINGS: VASCULAR Aorta: Mild atherosclerosis. No aneurysm or dissection. No significant stenosis. Celiac: Patent without evidence of aneurysm, dissection, vasculitis or significant stenosis. SMA: Patent without evidence of aneurysm, dissection, vasculitis or significant stenosis. Renals: Both renal arteries are patent without evidence of aneurysm, dissection, vasculitis, fibromuscular dysplasia or significant stenosis. IMA: Small with patent. Inflow: Patent without evidence of aneurysm, dissection, vasculitis or significant stenosis. Proximal Outflow: Bilateral common femoral and visualized portions of the superficial and profunda femoral arteries are patent without evidence of aneurysm, dissection, vasculitis or significant stenosis. Veins: Unremarkable. Review of the MIP images confirms the above findings. NON-VASCULAR Lower chest: Dependent lung base atelectasis, new since the previous day's study. Previously noted small nodules are obscured. Hepatobiliary: Liver normal size. Decreased liver attenuation consistent with fatty infiltration. No mass or focal lesion. Gallbladder is distended. There are dependent gallstones. Air is seen in the non dependent gallbladder and along the anterior superior wall. There is hazy inflammatory change adjacent to the gallbladder and in the right upper quadrant, new compared to the previous day's CT. No bile duct dilation. Pancreas: Unremarkable. No pancreatic ductal dilatation or surrounding inflammatory changes. Spleen: Normal in size without focal abnormality. Adrenals/Urinary Tract: No  adrenal masses. Kidneys normal in overall size, orientation and position with symmetric enhancement. Small intrarenal stones. Residual renal contrast. Right kidney midpole cyst, 1.5 cm. No follow-up recommended. No hydronephrosis. Normal ureters. Bladder decompressed with a Foley catheter. Stomach/Bowel: Previously seen arterial enhancement extravasation in the left mid abdomen, arising from a diverticulum along the jejunum, is no longer appreciated. The diverticulum is again noted as are several additional small bowel diverticula. There are no current areas of abnormal bowel enhancement or extravasation of contrast into the lumen to suggest an active GI bleeding source. Linear metal densities project adjacent to a loop of jejunum consistent with embolization coils. This is seen on axial image 50, series 19, coronal image 55, series 14. Nasogastric tube curls within the stomach. No stomach wall thickening or inflammation. Small bowel and colon are normal in caliber. There is no bowel wall thickening. No adjacent mesenteric inflammation. Enteric capsule now lies within the right colon. Lymphatic: No enlarged lymph nodes. Reproductive: Unremarkable. Other: Trace ascites adjacent to the liver. Musculoskeletal: No fracture or acute finding.  No bone lesion. IMPRESSION: VASCULAR 1. Mild aortic atherosclerosis.  No aneurysm or dissection. 2. Aortic branch vessels are widely patent. NON-VASCULAR 1. Previously seen active GI bleeding source, from a jejunal diverticulum in the left mid abdomen, is no  longer visualized. 2. No current evidence of an active GI bleeding source. 3. Abnormal gallbladder including non dependent air, gallbladder wall air and pericholecystic and right upper quadrant inflammation developing since the previous day's CT. Findings consistent with acute cholecystitis. 4. Trace ascites, new since the previous day's CT. 5. New dependent lung base atelectasis. Electronically Signed   By: Lajean Manes M.D.    On: 09/07/2021 16:38   DG Abd Portable 1V  Result Date: 09/07/2021 CLINICAL DATA:  Nasogastric tube placement. EXAM: PORTABLE ABDOMEN - 1 VIEW COMPARISON:  None Available. FINDINGS: Distal tip of nasogastric tube is seen within the stomach. IMPRESSION: Distal tip of nasogastric tube seen within the stomach. Electronically Signed   By: Marijo Conception M.D.   On: 09/07/2021 14:41   DG Chest Port 1 View  Result Date: 09/07/2021 CLINICAL DATA:  Intubation. EXAM: PORTABLE CHEST 1 VIEW COMPARISON:  Same day. FINDINGS: Endotracheal tube is in grossly good position. Distal tip of nasogastric tube is seen in the stomach. Right-sided PICC line is noted with distal tip in expected position of cavoatrial junction. Elevated right hemidiaphragm is noted with mild right basilar atelectasis. IMPRESSION: Endotracheal and nasogastric tubes are in grossly good position. Electronically Signed   By: Marijo Conception M.D.   On: 09/07/2021 14:40   DG Chest Port 1 View  Result Date: 09/07/2021 CLINICAL DATA:  61 year old male with bleeding small bowel diverticula. Hypoxia. EXAM: PORTABLE CHEST 1 VIEW COMPARISON:  Portable chest 09/06/2021 and earlier. FINDINGS: Portable AP semi upright view at 0954 hours. Very low lung volumes now. There is a right side central venous catheter. Stable cardiac size and mediastinal contours. Allowing for portable technique the lungs are clear. Visualized tracheal air column is within normal limits. Visible bowel-gas pattern within normal limits. No acute osseous abnormality identified. IMPRESSION: Very Low lung volumes.  No acute cardiopulmonary abnormality. Electronically Signed   By: Genevie Ann M.D.   On: 09/07/2021 10:03   [place] EKG SITE RITE  Result Date: 09/06/2021 If Site Rite image not attached, placement could not be confirmed due to current cardiac rhythm.  DG CHEST PORT 1 VIEW  Result Date: 09/06/2021 CLINICAL DATA:  Shortness of breath. EXAM: PORTABLE CHEST 1 VIEW COMPARISON:  October 10, 2004. FINDINGS: Stable cardiomediastinal silhouette. Lungs are clear. Mildly elevated right hemidiaphragm is noted. Bony thorax is unremarkable. IMPRESSION: No active disease. Electronically Signed   By: Marijo Conception M.D.   On: 09/06/2021 16:37   CT ANGIO GI BLEED  Addendum Date: 09/06/2021   ADDENDUM REPORT: 09/06/2021 15:13 ADDENDUM: Critical Value/emergent results were called by telephone at the time of interpretation on 09/06/2021 at 3:12 pm to provider [facility] , who verbally acknowledged these results. Electronically Signed   By: Misty Stanley M.D.   On: 09/06/2021 15:13   Result Date: 09/06/2021 CLINICAL DATA:  Rectal bleeding. EXAM: CTA ABDOMEN AND PELVIS WITHOUT AND WITH CONTRAST TECHNIQUE: Multidetector CT imaging of the abdomen and pelvis was performed using the standard protocol during bolus administration of intravenous contrast. Multiplanar reconstructed images and MIPs were obtained and reviewed to evaluate the vascular anatomy. RADIATION DOSE REDUCTION: This exam was performed according to the departmental dose-optimization program which includes automated exposure control, adjustment of the mA and/or kV according to patient size and/or use of iterative reconstruction technique. CONTRAST:  172mL OMNIPAQUE IOHEXOL 350 MG/ML SOLN COMPARISON:  09/05/2021 FINDINGS: VASCULAR Aorta: Normal caliber aorta without aneurysm, dissection, vasculitis or significant stenosis. Mild atherosclerotic calcification noted.  Celiac: Patent without evidence of aneurysm, dissection, vasculitis or significant stenosis. Mass-effect on the celiac origin likely secondary to compression by the median arcuate ligament. SMA: Patent without evidence of aneurysm, dissection, vasculitis or significant stenosis. Replaced right hepatic artery. Renals: Both renal arteries are patent without evidence of aneurysm, dissection, vasculitis, fibromuscular dysplasia or significant stenosis. Accessory left renal artery  noted. 2 accessory renal arteries noted right kidney, 1 of which arises from the right common iliac artery. IMA: Patent without evidence of aneurysm, dissection, vasculitis or significant stenosis. Inflow: Patent without evidence of aneurysm, dissection, vasculitis or significant stenosis. Proximal Outflow: Bilateral common femoral and visualized portions of the superficial and profunda femoral arteries are patent without evidence of aneurysm, dissection, vasculitis or significant stenosis. Veins: No obvious venous abnormality within the limitations of this arterial phase study. Review of the MIP images confirms the above findings. NON-VASCULAR Lower chest: Tiny pulmonary nodules again identified measuring up to 4 mm. Hepatobiliary: No suspicious focal abnormality within the liver parenchyma. Small calcified gallstones evident. No intrahepatic or extrahepatic biliary dilation. Pancreas: No focal mass lesion. No dilatation of the main duct. No intraparenchymal cyst. No peripancreatic edema. Spleen: No splenomegaly. No focal mass lesion. Adrenals/Urinary Tract: No adrenal nodule or mass. Tiny nonobstructing stones are noted in both kidneys. Small cyst noted lower interpolar right kidney. Left kidney unremarkable. No evidence for hydroureter. The urinary bladder appears normal for the degree of distention. Stomach/Bowel: Stomach is unremarkable. No gastric wall thickening. No evidence of outlet obstruction. Duodenum is normally positioned as is the ligament of Treitz. Duodenal diverticuli noted. No small bowel wall thickening. No small bowel dilatation. Active arterial phase bleeding is identified in in a small bowel loop of the left abdomen. Bleeding appears to arise in a diverticulum (axial image 96 series 9) with layering of extravasated contrast dependently within the bowel lumen. Apparent diverticulum also well demonstrated on coronal image 64/11 with additional small bowel diverticuli seen in the same region  (also coronal 64/11). There is a capsule endoscope in the proximal duodenum on precontrast imaging but has migrated into the proximal jejunum by postcontrast imaging. The terminal ileum is normal. The appendix is normal. No gross colonic mass. No colonic wall thickening. Lymphatic: There is no gastrohepatic or hepatoduodenal ligament lymphadenopathy. No retroperitoneal or mesenteric lymphadenopathy. No pelvic sidewall lymphadenopathy. Reproductive: The prostate gland and seminal vesicles are unremarkable. Other: No intraperitoneal free fluid. Musculoskeletal: No worrisome lytic or sclerotic osseous abnormality. Ventral mesh evident. There is a paraumbilical ventral hernia containing only fat. IMPRESSION: VASCULAR 1. Active arterial phase extravasation identified in a small bowel loop of the left upper quadrant, likely jejunal. The source of the bleeding appears to be a diverticulum and multiple additional diverticuli are seen in the duodenum and proximal small bowel. 2. Abdominal aortic atherosclerosis without aneurysm. NON-VASCULAR 1. Capsule endoscope identified in the duodenum on precontrast imaging migrates into proximal small bowel by postcontrast series. 2. Cholelithiasis. 3. Bilateral nonobstructing nephrolithiasis. 4. Duodenal and jejunal diverticuli. 5. Tiny pulmonary nodules again noted in both lung bases measuring up to 4 mm. No follow-up needed if patient is low-risk (and has no known or suspected primary neoplasm). Non-contrast chest CT can be considered in 12 months if patient is high-risk. This recommendation follows the consensus statement: Guidelines for Management of Incidental Pulmonary Nodules Detected on CT Images: From the Fleischner Society 2017; Radiology 2017; 123XX123. 6. Paraumbilical anterior abdominal wall hernia containing only fat in this patient status post ventral mesh placement. Hernia may be along  the inferior margin of the existing mash. Electronically Signed: By: Kennith Center  M.D. On: 09/06/2021 15:06   CT Angio Abd/Pel w/ and/or w/o  Result Date: 09/05/2021 CLINICAL DATA:  61 year old with rectal bleeding. EXAM: CTA ABDOMEN AND PELVIS WITHOUT AND WITH CONTRAST TECHNIQUE: Multidetector CT imaging of the abdomen and pelvis was performed using the standard protocol during bolus administration of intravenous contrast. Multiplanar reconstructed images and MIPs were obtained and reviewed to evaluate the vascular anatomy. RADIATION DOSE REDUCTION: This exam was performed according to the departmental dose-optimization program which includes automated exposure control, adjustment of the mA and/or kV according to patient size and/or use of iterative reconstruction technique. CONTRAST:  OMNIPAQUE IOHEXOL 350 MG/ML SOLN COMPARISON:  None Available. FINDINGS: VASCULAR Aorta: Normal caliber aorta without aneurysm, dissection, vasculitis or significant stenosis. Celiac: Patent without evidence of aneurysm, dissection, vasculitis or significant stenosis. Mild narrowing in the proximal celiac artery is likely related to median arcuate ligament compression. SMA: Replaced right hepatic artery which is a normal variant. SMA is widely patent without aneurysm or dissection. Renals: Bilateral renal arteries are patent without evidence of aneurysm, dissection, vasculitis, fibromuscular dysplasia or significant stenosis. Single accessory left renal artery and 2 small accessory right renal arteries. Most inferior right accessory renal artery originates from the proximal right common iliac artery. IMA: Patent without evidence of aneurysm, dissection, vasculitis or significant stenosis. Inflow: Patent without evidence of aneurysm, dissection, vasculitis or significant stenosis. Proximal Outflow: Proximal femoral arteries are patent bilaterally. Veins: Portal venous system is patent. IVC is patent. There is a retroaortic left renal vein which is a normal variant. IVC and iliac veins are patent. Review of  the MIP images confirms the above findings. NON-VASCULAR Lower chest: Several small nodules at the both lung bases. Largest nodule measures 4 mm in the left lower lobe on sequence 6 image 37. Coronary artery calcifications. Hepatobiliary: Layering gallstones. Mild distention of the gallbladder without surrounding inflammatory changes. Normal appearance of the liver without intrahepatic or extrahepatic biliary dilatation. Pancreas: Unremarkable. No pancreatic ductal dilatation or surrounding inflammatory changes. Spleen: Normal in size without focal abnormality. Adrenals/Urinary Tract: Normal adrenals bilaterally. 3 mm stone in the right kidney interpolar region. Probably 3 small stones in the left kidney, largest is in the left interpolar region measuring 3 mm. Negative for hydronephrosis. No suspicious renal lesions. 1.2 cm low-density structure in the right kidney interpolar region is most compatible with a cyst and does not require dedicated follow-up. Normal appearance of the urinary bladder. Stomach/Bowel: Duodenal diverticula near the pancreatic head. Another diverticulum in the distal duodenum. Diverticulum involving the small bowel on sequence 13, image 88 measuring up to 1.7 cm. Additional small bowel diverticulum on sequence 13 image 111 measuring up to 2.1 cm. No evidence for bowel dilatation or focal bowel inflammation. No evidence for active GI bleeding. Normal appendix without inflammatory changes. Lymphatic: No lymph node enlargement in the abdomen or pelvis. Reproductive: Prostate is unremarkable. Other: There is a small complex periumbilical ventral hernia containing fat. Surgical mesh along the anterior abdominal wall. Negative for ascites. Negative for free air. Musculoskeletal: No acute bone abnormality. IMPRESSION: VASCULAR 1. No evidence for active GI bleeding. 2. No significant atherosclerotic disease in the abdomen or pelvis. 3. Coronary artery calcifications. 4. Main visceral arteries are  patent with variant anatomy. Replaced right hepatic artery and bilateral accessory renal arteries. NON-VASCULAR 1. No acute abnormality in the abdomen or pelvis. 2. Bilateral nonobstructive nephrolithiasis. 3. Cholelithiasis. 4. Multiple pulmonary nodules. Most severe:  4 mm left solid pulmonary nodule. Per Fleischner Society Guidelines, no routine follow-up imaging is recommended. These guidelines do not apply to immunocompromised patients and patients with cancer. Follow up in patients with significant comorbidities as clinically warranted. For lung cancer screening, adhere to Lung-RADS guidelines. Reference: Radiology. 2017; 284(1):228-43. 5. Postsurgical changes along the anterior abdominal wall with complex ventral hernia containing fat. 6. Duodenal and jejunal diverticula. Electronically Signed   By: Markus Daft M.D.   On: 09/05/2021 12:47    Labs:  CBC: Recent Labs    09/07/21 2056 09/07/21 2350 09/08/21 0344 09/08/21 0355 09/08/21 0809  WBC 6.2 10.7* 21.3*  --  30.7*  HGB 7.2* 7.6* 7.7* 6.5* 7.5*  HCT 20.2* 21.8* 22.2* 19.0* 21.8*  PLT 108*  110* 106* 131*  --  133*    COAGS: Recent Labs    04/28/21 1504 09/05/21 0307 09/06/21 2240 09/07/21 2056  INR 1.0 1.2 1.6* 2.0*  APTT 26  --   --  32    BMP: Recent Labs    09/07/21 0613 09/07/21 1505 09/07/21 2054 09/08/21 0344 09/08/21 0355 09/08/21 0809  NA 136   < > 135 137 137 136  K 5.1   < > 4.4 3.6 3.7 4.3  CL 109  --  108 109  --  109  CO2 18*  --  18* 19*  --  20*  GLUCOSE 300*  --  248* 172*  --  176*  BUN 30*  --  41* 44*  --  46*  CALCIUM 6.9*  --  6.5* 6.4*  --  6.5*  CREATININE 1.85*  --  2.89* 3.61*  --  3.57*  GFRNONAA 42*  --  24* 19*  --  19*   < > = values in this interval not displayed.    LIVER FUNCTION TESTS: Recent Labs    04/29/21 0502 09/04/21 1238 09/06/21 2240 09/07/21 2054  BILITOT 0.5 0.8 0.4 0.5  AST 20 15 21  33  ALT 22 17 16  32  ALKPHOS 34* 35* 21* 124  PROT 6.8 6.9 3.7* 3.2*   ALBUMIN 3.6 3.7 1.8* <1.5*    Assessment and Plan:  GI bleed --Hgb at 7.5 this morning --1 maroon BM since angio, suggestive of old blood and not active bleed --angio access site soft, not bleeding, and without pseudoaneurysm  Acute Cholecystitis --1 day s/p chole tube, with 375cc OP --drain functioning well  Plan: Continue TID flushes with 5 cc NS. Record output Q shift. Dressing changes QD or PRN if soiled.   Call IR APP or on call IR MD if difficulty flushing or sudden change in drain output.    IR will continue to follow - please call with questions or concerns.  Electronically Signed: Pasty Spillers, PA 09/08/2021, 9:53 AM   I spent a total of 15 Minutes at the the patient's bedside AND on the patient's hospital floor or unit, greater than 50% of which was counseling/coordinating care for acute cholecystitis and GI bleed

## 2021-09-08 NOTE — Consult Note (Addendum)
[facility] Renal Consultation Note  Requesting MD: Durel Salts, MD Indication for Consultation: AKI   Chief complaint: dark stools   HPI:  Troy Randall is a 61 y.o. male with a history of hypertension, diabetes, nephrolithiasis, arthritis, and prior CVA who presented to the hospital with dark stools.  He had also complained of dizziness.  He had CTA which was without active bleed and had a colonoscopy without active bleed but clots noted throughout the entire colon per charting. Later underwent EGD with erosive gastropathy but no stigmata of bleeding.  Then had small bowel enteroscopy and later repeat CTA which was concerning for a bleed.  He was transferred from [facility] to [facility] and IR was consulted.  He underwent coil embolization. Per pulm he had hemorrhagic shock then developed septic shock.  Note he was also found to have cholecystitis and is s/p placement of a cholecystostomy tube.  He is on three pressors - levo, phenylephrine and vasopressin.  He has had respiratory failure and remains intubated. He has had worsening AKI and nephrology is consulted for assistance with management.  He had 400 mL uop over 9/3.   I spoke with his wife about the risks/benefits/indications for renal replacement therapy.  Primary team is at bedside placing a dialysis catheter.  The patient is on max levo at 40 mcg/min and phenylephrine at 400 mcg/min as well as bicarb gtt.  Team is hopeful that improving his acidosis may reduce his pressor requirement.    Creatinine  Date/Time Value Ref Range Status  09/26/2014 12:00 AM 1.1 0.6 - 1.3 mg/dL Final   Creat  Date/Time Value Ref Range Status  03/31/2017 04:25 PM 0.90 0.70 - 1.33 mg/dL Final    Comment:    For patients >67 years of age, the reference limit for Creatinine is approximately 13% higher for people identified as African-American. .    Creatinine, Ser  Date/Time Value Ref Range Status  09/08/2021 08:09 AM 3.57 (H) 0.61 - 1.24  mg/dL Final  56/43/3295 18:84 AM 3.61 (H) 0.61 - 1.24 mg/dL Final  16/60/6301 60:10 PM 2.89 (H) 0.61 - 1.24 mg/dL Final    Comment:    DELTA CHECK NOTED  09/07/2021 06:13 AM 1.85 (H) 0.61 - 1.24 mg/dL Final  93/23/5573 22:02 PM 1.77 (H) 0.61 - 1.24 mg/dL Final  54/27/0623 76:28 AM 1.00 0.61 - 1.24 mg/dL Final  31/51/7616 07:37 PM 1.23 0.61 - 1.24 mg/dL Final  10/62/6948 54:62 AM 0.70 0.61 - 1.24 mg/dL Final  70/35/0093 81:82 PM 1.07 0.61 - 1.24 mg/dL Final  99/37/1696 78:93 AM 0.81 0.40 - 1.50 mg/dL Final  81/01/7508 25:85 PM 0.94 0.40 - 1.50 mg/dL Final  27/78/2423 53:61 AM 1.02 0.40 - 1.50 mg/dL Final  44/31/5400 86:76 AM 0.96 0.40 - 1.50 mg/dL Final  19/50/9326 71:24 PM 0.97 0.40 - 1.50 mg/dL Final  58/09/9831 82:50 AM 0.60 0.50 - 1.35 mg/dL Final     PMHx:   Past Medical History:  Diagnosis Date   Anemia    Arthritis    CVA (cerebral vascular accident) (HCC)    Diabetes mellitus without complication (HCC)    Hyperlipidemia    Hypertension    Kidney stones     Past Surgical History:  Procedure Laterality Date   ANKLE SURGERY     HERNIA REPAIR     umbilical x1 Incisional x1   INCISIONAL HERNIA REPAIR  04/13/2011   Procedure: LAPAROSCOPIC INCISIONAL HERNIA;  Surgeon: Dalia Heading, MD;  Location: [facility];  Service: General;  Laterality: N/A;  Recurrent Laparoscopic Incisional Herniorraphy with Mesh   KIDNEY STONE SURGERY      Family Hx:  Family History  Problem Relation Age of Onset   Heart failure Mother    Diabetes Father    Cancer Other    Heart attack Other    Anesthesia problems Neg Hx    Hypotension Neg Hx    Malignant hyperthermia Neg Hx    Pseudochol deficiency Neg Hx    Colon cancer Neg Hx    Inflammatory bowel disease Neg Hx     Social History:  reports that he has never smoked. His smokeless tobacco use includes snuff. He reports that he does not drink alcohol and does not use drugs.  Allergies:  Allergies  Allergen Reactions   Metformin And  Related     GI upset   Penicillins Hives    Medications: Prior to Admission medications   Medication Sig Start Date End Date Taking? Authorizing Provider  amLODipine (NORVASC) 10 MG tablet Take 1 tablet (10 mg total) by mouth daily. 06/15/17  Yes Aliene Beams, MD  aspirin EC 81 MG tablet Take 81 mg by mouth daily. Swallow whole.   Yes [provider]  clopidogrel (PLAVIX) 75 MG tablet Take 1 tablet (75 mg total) by mouth daily. 04/30/21  Yes Azucena Fallen, MD  glipiZIDE (GLUCOTROL XL) 10 MG 24 hr tablet Take 1 tablet (10 mg total) by mouth daily with breakfast. 04/16/17  Yes Hagler, Fleet Contras, MD  levocetirizine (XYZAL) 5 MG tablet Take 1 tablet by mouth at bedtime.   Yes [provider]  lisinopril (PRINIVIL,ZESTRIL) 40 MG tablet Take 1 tablet (40 mg total) by mouth daily. 05/12/17  Yes Aliene Beams, MD  metFORMIN (GLUCOPHAGE-XR) 500 MG 24 hr tablet Take 500-1,000 mg by mouth in the morning and at bedtime. 02/09/21  Yes [provider]  rosuvastatin (CRESTOR) 40 MG tablet Take 1 tablet (40 mg total) by mouth daily. 04/29/21  Yes Azucena Fallen, MD  tamsulosin (FLOMAX) 0.4 MG CAPS capsule Take 0.4 mg by mouth daily. 02/12/21  Yes [provider]  traZODone (DESYREL) 150 MG tablet Take 150 mg by mouth at bedtime. 02/04/21  Yes [provider]  TRESIBA FLEXTOUCH 200 UNIT/ML FlexTouch Pen Inject 20 Units into the skin in the morning and at bedtime. 03/14/21  Yes [provider]  BD VEO INSULIN SYRINGE U/F 31G X 15/64" 1 ML MISC  USE AS DIRECTED 06/08/17   Aliene Beams, MD  glucose blood (ONETOUCH VERIO) test strip TEST twice a day 05/04/17   Aliene Beams, MD  Insulin Pen Needle (NOVOTWIST) 32G X 5 MM MISC Use two daily to inject Victoza and Toujeo. 04/25/15   Reather Littler, MD  [facility] DELICA LANCETS 33G MISC Use to check blood sugar once a day dx code E11.65 11/21/14   Reather Littler, MD    I have reviewed the patient's current and reported  prior to admission medications.  Labs:     Latest Ref Rng & Units 09/08/2021   11:26 AM 09/08/2021    8:09 AM 09/08/2021    3:55 AM  BMP  Glucose 70 - 99 mg/dL  761    BUN 6 - 20 mg/dL  46    Creatinine 6.07 - 1.24 mg/dL  3.71    Sodium 062 - 694 mmol/L 135  136  137   Potassium 3.5 - 5.1 mmol/L 4.8  4.3  3.7   Chloride 98 - 111  mmol/L  109    CO2 22 - 32 mmol/L  20    Calcium 8.9 - 10.3 mg/dL  6.5      Urinalysis    Component Value Date/Time   COLORURINE STRAW (A) 04/28/2021 1735   APPEARANCEUR CLEAR 04/28/2021 1735   LABSPEC 1.016 04/28/2021 1735   PHURINE 5.0 04/28/2021 1735   GLUCOSEU 50 (A) 04/28/2021 1735   GLUCOSEU >=1000 (A) 12/12/2014 1555   HGBUR NEGATIVE 04/28/2021 1735   BILIRUBINUR NEGATIVE 04/28/2021 1735   KETONESUR NEGATIVE 04/28/2021 1735   PROTEINUR 30 (A) 04/28/2021 1735   UROBILINOGEN 0.2 12/12/2014 1555   NITRITE NEGATIVE 04/28/2021 1735   LEUKOCYTESUR NEGATIVE 04/28/2021 1735     ROS:  Unable to obtain 2/2 mechanical ventilation   Physical Exam: Vitals:   09/08/21 1230 09/08/21 1245  BP: (!) 86/42 (!) 74/59  Pulse: (!) 120 (!) 131  Resp:    Temp: (!) 100.4 F (38 C) (!) 100.4 F (38 C)  SpO2: 100% 100%     General:  adult male critically ill   HEENT: NCAT Eyes: closed; does not track Neck: increased neck circumference; trachea midline Heart: s1S2 no rub Lungs: team is placing his access now shortly; drape is over his lung fields; on FIO2 50 and PEEP 5 Abdomen: soft/obese  Extremities: no pitting edema lower extremities; edema upper extremities appreciated  Skin: no rash on extremities exposed; covered with drape for procedure.  Neuro: sedation currently running Access - team is at bedside about to start procedure for access placement  Assessment/Plan:  # AKI  - secondary to ischemic ATN in the setting of multifactorial shock.  He has received clinically indicated contrast  - Start CRRT - team is hoping to see if by correcting his  acidosis we can improve his significant pressor requirement  - UF goal is zero ml at this time (not keep even); reassess fluid removal goals tomorrow and as needed per clinical stability - 4K fluids   # Shock - mixed Septic and hemorrhagic shock  - Pressors per primary team  - source control of GI bleed and infection as below - abx per primary team   # Acute hypoxic respiratory failure  - on mechanical ventilation per primary team   # Acute GI bleed - s/p coil embolization  - s/p multiple studies and interventions as above - on PPI - transfusions per primary team   # Acute blood loss anemia  - transfusions per primary team   # Metabolic acidosis - secondary to AKI; as above - now also maxed on two pressors  # Cholecystitis  - s/p cholecystomy drain placement  - abx per primary team    Estanislado Emms 09/08/2021, 1:52 PM

## 2021-09-08 NOTE — Progress Notes (Signed)
Heart rate is too high for accurate echo at this time. 

## 2021-09-08 NOTE — Procedures (Signed)
Central Venous Catheter Insertion Procedure Note  Line type:  Triple Lumen Dialysis Catheter  Indications:  Need for CRRT  Procedure Details:  Informed consent was obtained from his wife after explanation of the risks and benefits of the procedure, refer to the consent documentation.  Time-out was performed immediately prior to the procedure.  The right internal jugular vein was identified using bedside ultrasound. This area was prepped and draped in the usual sterile fashion. Maximum sterile technique was used including antiseptics, cap, gloves, gown, hand hygiene, mask, and sterile sheet.  The patient was placed in Trendelenburgs position. Local anesthesia with 1% lidocaine was applied subcutaneously then deep to the skin. The right,anterior was then inserted into the right, inferior vein using ultrasound guidance (image below).  A triple lumen dialysis catheter was placed with each port easily flushed and freely drawing venous blood.  The catheter was secured with sutures. A sterile bandage was placed over the site.  Condition: The patient tolerated the procedure well and remains in the same condition as pre-procedure.  Complications: None; patient tolerated the procedure well.  Plan: CXR was ordered to verify placement.

## 2021-09-08 NOTE — TOC Progression Note (Signed)
Transition of Care [facility]) - Initial/Assessment Note    Patient Details  Name: Troy Randall MRN: [id number] Date of Birth: 1960/04/23  [facility] CM/SW Contact:    Ralene Bathe, LCSWA Phone Number: 09/08/2021, 1:33 PM  Clinical Narrative:                 TOC following patient for any d/c planning needs once medically stable.  Patient is currently intubated.   Cleon Gustin, MSW, LCSWA     Barriers to Discharge: Continued Medical Work up   Patient Goals and CMS Choice        Expected Discharge Plan and Services                                                Prior Living Arrangements/Services                       Activities of Daily Living Home Assistive Devices/Equipment: CBG Meter ADL Screening (condition at time of admission) Patient's cognitive ability adequate to safely complete daily activities?: Yes Is the patient deaf or have difficulty hearing?: No Does the patient have difficulty seeing, even when wearing glasses/contacts?: No Does the patient have difficulty concentrating, remembering, or making decisions?: No Patient able to express need for assistance with ADLs?: Yes Does the patient have difficulty dressing or bathing?: No Independently performs ADLs?: Yes (appropriate for developmental age) Does the patient have difficulty walking or climbing stairs?: No Weakness of Legs: None Weakness of Arms/Hands: None  Permission Sought/Granted                  Emotional Assessment              Admission diagnosis:  GI bleed [K92.2] Lower GI bleed [K92.2] Patient Active Problem List   Diagnosis Date Noted   Hemorrhagic shock (HCC)    Shock circulatory (HCC)    Acute blood loss anemia    GI bleed 09/04/2021   Acute CVA (cerebrovascular accident) (HCC) 04/28/2021   Cannot sleep 08/26/2015   Chronic right-sided low back pain with sciatica 07/10/2015   Uncontrolled type 2 diabetes mellitus with hyperglycemia,  without long-term current use of insulin (HCC) 06/19/2015   Hyperlipidemia 11/29/2013   Essential hypertension 11/28/2013   PCP:  Benita Stabile, MD Pharmacy:   [facility] [address] [place] - [address] [place] [address] Phone: [phone] Fax: [phone]     Social Determinants of Health (SDOH) Interventions    Readmission Risk Interventions     No data to display

## 2021-09-08 NOTE — Progress Notes (Signed)
eLink Physician-Brief Progress Note Patient Name: Troy Randall DOB: [birth year] MRN: [id number]   Date of Service  09/08/2021  HPI/Events of Note  Notified of some maroon colored stool. No fresh red blood. Had a CTA done in last 24 hours which did not show active bleed . No change in vitals. Remains on Levophed at 40 mic and neo at 400, with vasopressin. Was seen by IR earlier today as well. Recent hemoglobin is 7.1 and he is on CRRT. While goal is to keep Hb over 7, he is severely ill so will go ahead and transfuse 1 unit RBC.   eICU Interventions  1 unit RBC x 1 2 gram cal gluconate x 1 RN to send post transfusion CBC 30 min after completing transfusion  Please inform us results      Intervention Category Major Interventions: Shock - evaluation and management  Oretha Milch 09/08/2021, 7:34 PM

## 2021-09-08 NOTE — Progress Notes (Addendum)
NAME:  Troy Randall, MRN:  [id number], DOB:  1960/01/23, LOS: 4 ADMISSION DATE:  09/04/2021, CONSULTATION DATE:  09/06/21 REFERRING MD:  [name], CHIEF COMPLAINT:  melena   History of Present Illness:   Troy Randall is an 61 y.o. male with prior history of HTN, HLD, DM, and CVA on ASA/ plavix who presented to [facility] on 8/31 after developing frequent 6-7 dark stools and feeling dizzy and lightheaded on standing.  No hx of NSAIDs.  FOBT positive and initial Hgb 12.2.    Patient admitted to [facility] with GI consulting.  Remained orthostatic with progressive decline in Hgb.  CTA abdomen did not show any active bleed, therefore underwent colonoscopy > no active bleed found but large clots throughout entire colon.  Then underwent EGD which showed a small hiatal hernia, erosive gastropathy with no stigmata of recent bleeding, and non-bleeding duodenal diverticulum.   Underwent then small bowel enteroscopy which noted intermittent blood pumping in the proximal jejunum but was unable to be reached despite multiple attempts, was injected and clip placed.  Repeat CTA repeated, which showed positive jejunal diverticular bleeding. Patient was transferred to [facility] for IR involvement. In IR active extravasation  at SMA territory, jejunal arcade branch, successful coil embolization.  PCCM was consulted for transfer and admission.  Pertinent  Medical History  HTN, HLD, DM, CVA on ASA/ plavix  Significant Hospital Events: Including procedures, antibiotic start and stop dates in addition to other pertinent events   8/31 presented to [facility] ED, [name] Admit, Gi Consult 9/1 CTA> no evidence of active GI bleed, 2 U PRBC 9/2 2 U PRBC, colonoscopy > no active bleed found but large clots throughout entire colon. EGD> small hiatal hernia, erosive gastropathy with no stigmata of recent bleeding, and non-bleeding duodenal diverticulum.   Underwent then small bowel enteroscopy which noted intermittent blood pumping in the proximal jejunum  but was unable to be reached despite multiple attempts, was injected and clip placed. Repeat CTA repeated, which showed positive jejunal diverticular bleeding. IR> active extrav at [facility] territory, jejunal arcade branch, successful coil embo 9/3 Intubated. CTA> no evidence of active GI bleeding.  New finding of abnormal gallbladder with nondependent air, gallbladder wall air, pericholecystic and right upper quadrant information.  IR placed percutaneous cholecystotomy tube.   Interim History / Subjective:   Per chole drain placed yesterday. Requiring three pressors and remains with MAPS in 60-70. Abx broadened overnight to meropenem.   Objective   Blood pressure 99/76, pulse (!) 113, temperature 100 F (37.8 C), resp. rate (!) 22, height 6' (1.829 m), weight (!) 151.8 kg, SpO2 98 %. Vent Mode: PSV;CPAP FiO2 (%):  [40 %-100 %] 40 % Set Rate:  [18 bmp] 18 bmp Vt Set:  [620 mL] 620 mL PEEP:  [5 cmH20] 5 cmH20 Pressure Support:  [10 cmH20] 10 cmH20 Plateau Pressure:  [20 cmH20] 20 cmH20   Intake/Output Summary (Last 24 hours) at 09/08/2021 0949 Last data filed at 09/08/2021 0900 Gross per 24 hour  Intake 7973.41 ml  Output 770 ml  Net 7203.41 ml   Filed Weights   09/04/21 1222 09/06/21 2009 09/08/21 0400  Weight: (!) 139.3 kg (!) 147.5 kg (!) 151.8 kg   Examination: Gen:    Sedated, in no distress HEENT:  mmm on nasal cannula Lungs:   clear no wheezes or crackles CV:         tachycardic, regular Abd:      Obese, soft Ext:    No edema Skin:  Warm and dry; no rashes. 1+ lower extremity edema Neuro:   normal speech no focal asymmetry  Resolved Hospital Problem list     Assessment & Plan:   Mixed Shock - Septic and hemorrhagic Continue to treat underlying cause with abx and transfuse PRBC as needed per below. Overnight requiring levo, vasopressin, and phenylephrine. Despite evening cortisol being normal, will start stress dose steroids with pressor requirements.  -Continue levo,  vasopressin, and phenylephrine, wean as able -Treatment for sepsis and hemorrhagic shock as per below -a-line in place -Lactic acid 3.5>3.0 -start hydrocortisone 100 mg q12h  Septic shock Cholecystitis s/p percutaneous cholecystostomy drain placement 9/3 percutaneous cholecystostomy drain placed.  -375 cc output.  Cultures positive for gram-positive and gram-negative organisms with PMNs.  Blood cultures negative at 24 hours.  Antibiotics broadened to meropenem yesterday -Continue meropenem day 1 follow-up cultures -Continue to monitor drain -Trend leukocytosis and monitor for continued fevers -Will need outpatient GI follow up  Hemorrhagic shock ABLA secondary to lower GI bleed, likely diverticular Acute blood loss anemia, secondary to above - 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 Episode of dark stools yesterday, likely residual blood.  CBC stable 7.7> 7.5.  -IR and GI consulted. Appreciate assistance. Post procedure care per IR recs -Transition to daily PPI -Transfuse PRBC if HBG less than 7 -Trend HGB, twice daily -Monitor for signs of bleeding -hold constipation meds  Oliguric AKI Non-anion gap metabolic acidosis Hypocalcemia Bilateral nonobstructing nephrolithiasis - suspect chronic BPH Cr of 3.6 this morning, 4 months ago Cr around 1.0. Renal function continues to worsen with minimal output in foley catheter. CTA yesterday without hydronephrosis. Likely in setting of mixed shock. Will consult nephrology today and plan to place central catheter in preparation for CRRT. Unable to give flomax via tube, will hold for now. Ionized calcium, mag, phos pending. Hypocalcemia likely in setting of sepsis.  -appreciate nephrologies assistance -plan for central catheter placement in anticipation of CRRT -electrolytes pending -continue bicarb ggt -hold flomax -foley catheter in place, strict i/o  Acute hypoxemic respiratory failure Likely in setting of acute illness and  shock as per above. Intubated 9/3.  -lung protected ventilation -VAP protocol -daily SBT/SAT -wean settings as tolerated -continue fentanyl, RASS goal of 0  History of CVA -hold asa plavix in setting of acute GI bleed -monitor neuro exam per unit protocol.  DM2 Current regimen of semglee 10 u nightly and moderate SSI q4h. With stress dose steroids, increase semglee to 15 u and SSI to resistant q4h. Can add scheduled novolog if needed.  -Blood Glucose goal 140-180. -SSI, semglee 10 u QHS  HTN HLD -Hold home antihypertensives and statin  Pulmonary nodules in bilateral bases, L 31mm Family reports patient is a never smoker. Family does endorse dipping. -no further follow up needed for a low risk patient based on fleischner criteria  Best Practice (right click and "Reselect all SmartList Selections" daily)   Diet/type: NPO DVT prophylaxis: SCD GI prophylaxis: PPI Lines: a-line, foley.  Foley:  yes Code Status:  full code Last date of multidisciplinary goals of care discussion [Full scope- Discussed with wife [name] 9/2 . Family and patient updated at bedside this morning  I spent 50 minutes in total visit time for this patient, with more than 50% spent counseling/coordinating care.  Thalia Bloodgood DO  Internal Medicine Resident PGY-3 [place]  Pager: [phone]    Attending Attestation.  I agree with the Resident's note, impression, and recommendations as outlined. I have taken an  independent interval history, reviewed the chart and examined the patient.  My medical decision making is as follows:  Mixed septic and hemorrhagic shock Acute cholecystitis s/p percutaneous biliary drain AKI, oliguric with metabolic acidosis.  ABLA from recent jejunal diverticular bleed Chronic issues - obesity, cva, dm2, Htn, hld  Plan for today is to continue merrem. Follows cultures and H&H. He will probably need CRRT today. Discussed with wife at bedside.   The patient is  critically ill due to shock, respiratory failure with multiple organ systems failure and requires high complexity decision making for assessment and support, frequent evaluation and titration of therapies, application of advanced monitoring technologies and extensive interpretation of multiple databases.   Critical Care Time devoted to patient care services described in this note is 65 minutes. This time reflects time of care of this signee Charlott Holler . This critical care time does not reflect separately billable procedures or procedure time, teaching time and supervisory time of PA/NP/Med student/Med Resident etc but could involve care discussion time.  Charlott Holler [facility] 09/08/2021 10:52 AM  Pager: see AMION  If no response to pager, please call critical care on call (see AMION) until 7pm After 7:00 pm call Elink

## 2021-09-08 NOTE — Progress Notes (Addendum)
eLink Physician-Brief Progress Note Patient Name: Troy Randall DOB: 02-01-1960 MRN: [id number]   Date of Service  09/08/2021  HPI/Events of Note  Notified of AM labs. CBC with hemoglobin 7.7, ABG hemoglobin lower on prior checks as well. D/w RN. Not making much urine at all, around 100 cc overnight. Ph is not bad, bicarb is 19, K is 3.6, LA reducing. Pressor needs still high but stable over last few hours. On low dose bicarb drip. RN notified me now of one maroon BM, not fresh red blood. Underwent imaging yesterday with no active bleeding so hopefully this is old blood.   eICU Interventions  Recheck CBC at 8 am and then Q6hr Recheck BMP at 8 am 1 gram calcium gluconate ordered this AM Likely headed toward CRRT so will check BMP at 8 am to see parameters      Intervention Category Major Interventions: Shock - evaluation and management  Oretha Milch 09/08/2021, 6:17 AM  Addendum - Also since last couple of K checks have been ok, am discontinuing the order for scheduled Lokelma. Re-assess based on 8 am sample.

## 2021-09-09 ENCOUNTER — Inpatient Hospital Stay (HOSPITAL_COMMUNITY): Payer: Medicare HMO

## 2021-09-09 DIAGNOSIS — E1110 Type 2 diabetes mellitus with ketoacidosis without coma: Secondary | ICD-10-CM | POA: Diagnosis not present

## 2021-09-09 DIAGNOSIS — N170 Acute kidney failure with tubular necrosis: Secondary | ICD-10-CM | POA: Diagnosis not present

## 2021-09-09 DIAGNOSIS — I10 Essential (primary) hypertension: Secondary | ICD-10-CM | POA: Diagnosis not present

## 2021-09-09 DIAGNOSIS — K922 Gastrointestinal hemorrhage, unspecified: Secondary | ICD-10-CM | POA: Diagnosis not present

## 2021-09-09 DIAGNOSIS — R0603 Acute respiratory distress: Secondary | ICD-10-CM | POA: Diagnosis not present

## 2021-09-09 DIAGNOSIS — J9601 Acute respiratory failure with hypoxia: Secondary | ICD-10-CM | POA: Diagnosis not present

## 2021-09-09 DIAGNOSIS — R579 Shock, unspecified: Secondary | ICD-10-CM | POA: Diagnosis not present

## 2021-09-09 DIAGNOSIS — D62 Acute posthemorrhagic anemia: Secondary | ICD-10-CM | POA: Diagnosis not present

## 2021-09-09 HISTORY — PX: IR ANGIOGRAM SELECTIVE EACH ADDITIONAL VESSEL: IMG667

## 2021-09-09 LAB — CBC
HCT: 22.5 % — ABNORMAL LOW (ref 39.0–52.0)
HCT: 21.4 % — ABNORMAL LOW (ref 39.0–52.0)
HCT: 19.8 % — ABNORMAL LOW (ref 39.0–52.0)
HCT: 16.5 % — ABNORMAL LOW (ref 39.0–52.0)
HCT: 18.2 % — ABNORMAL LOW (ref 39.0–52.0)
Hemoglobin: 8.1 g/dL — ABNORMAL LOW (ref 13.0–17.0)
Hemoglobin: 7.5 g/dL — ABNORMAL LOW (ref 13.0–17.0)
Hemoglobin: 7.1 g/dL — ABNORMAL LOW (ref 13.0–17.0)
Hemoglobin: 5.8 g/dL — CL (ref 13.0–17.0)
Hemoglobin: 6.4 g/dL — CL (ref 13.0–17.0)
MCH: 29.8 pg (ref 26.0–34.0)
MCH: 29.3 pg (ref 26.0–34.0)
MCH: 29.6 pg (ref 26.0–34.0)
MCH: 29.4 pg (ref 26.0–34.0)
MCH: 29.5 pg (ref 26.0–34.0)
MCHC: 36 g/dL (ref 30.0–36.0)
MCHC: 35 g/dL (ref 30.0–36.0)
MCHC: 35.9 g/dL (ref 30.0–36.0)
MCHC: 35.2 g/dL (ref 30.0–36.0)
MCHC: 35.2 g/dL (ref 30.0–36.0)
MCV: 82.7 fL (ref 80.0–100.0)
MCV: 83.6 fL (ref 80.0–100.0)
MCV: 82.5 fL (ref 80.0–100.0)
MCV: 83.8 fL (ref 80.0–100.0)
MCV: 83.9 fL (ref 80.0–100.0)
Platelets: 107 10*3/uL — ABNORMAL LOW (ref 150–400)
Platelets: 107 10*3/uL — ABNORMAL LOW (ref 150–400)
Platelets: 101 10*3/uL — ABNORMAL LOW (ref 150–400)
Platelets: 73 10*3/uL — ABNORMAL LOW (ref 150–400)
Platelets: 76 10*3/uL — ABNORMAL LOW (ref 150–400)
RBC: 2.72 MIL/uL — ABNORMAL LOW (ref 4.22–5.81)
RBC: 2.56 MIL/uL — ABNORMAL LOW (ref 4.22–5.81)
RBC: 2.4 MIL/uL — ABNORMAL LOW (ref 4.22–5.81)
RBC: 1.97 MIL/uL — ABNORMAL LOW (ref 4.22–5.81)
RBC: 2.17 MIL/uL — ABNORMAL LOW (ref 4.22–5.81)
RDW: 16.7 % — ABNORMAL HIGH (ref 11.5–15.5)
RDW: 17.2 % — ABNORMAL HIGH (ref 11.5–15.5)
RDW: 17.4 % — ABNORMAL HIGH (ref 11.5–15.5)
RDW: 18 % — ABNORMAL HIGH (ref 11.5–15.5)
RDW: 17.2 % — ABNORMAL HIGH (ref 11.5–15.5)
WBC: 26.2 10*3/uL — ABNORMAL HIGH (ref 4.0–10.5)
WBC: 25.4 10*3/uL — ABNORMAL HIGH (ref 4.0–10.5)
WBC: 24.6 10*3/uL — ABNORMAL HIGH (ref 4.0–10.5)
WBC: 16.7 10*3/uL — ABNORMAL HIGH (ref 4.0–10.5)
WBC: 15.6 10*3/uL — ABNORMAL HIGH (ref 4.0–10.5)
nRBC: 1.4 % — ABNORMAL HIGH (ref 0.0–0.2)
nRBC: 1.3 % — ABNORMAL HIGH (ref 0.0–0.2)
nRBC: 1.5 % — ABNORMAL HIGH (ref 0.0–0.2)
nRBC: 1.7 % — ABNORMAL HIGH (ref 0.0–0.2)
nRBC: 1.9 % — ABNORMAL HIGH (ref 0.0–0.2)

## 2021-09-09 LAB — PREPARE RBC (CROSSMATCH)

## 2021-09-09 LAB — RENAL FUNCTION PANEL
Albumin: <1.5 g/dL — ABNORMAL LOW (ref 3.5–5.0)
Albumin: <1.5 g/dL — ABNORMAL LOW (ref 3.5–5.0)
Anion gap: 11 (ref 5–15)
Anion gap: 5 (ref 5–15)
BUN: 38 mg/dL — ABNORMAL HIGH (ref 6–20)
BUN: 39 mg/dL — ABNORMAL HIGH (ref 6–20)
CO2: 22 mmol/L (ref 22–32)
CO2: 24 mmol/L (ref 22–32)
Calcium: 6.6 mg/dL — ABNORMAL LOW (ref 8.9–10.3)
Calcium: 6.5 mg/dL — ABNORMAL LOW (ref 8.9–10.3)
Chloride: 106 mmol/L (ref 98–111)
Chloride: 110 mmol/L (ref 98–111)
Creatinine, Ser: 2.09 mg/dL — ABNORMAL HIGH (ref 0.61–1.24)
Creatinine, Ser: 1.65 mg/dL — ABNORMAL HIGH (ref 0.61–1.24)
GFR, Estimated: 36 mL/min — ABNORMAL LOW (ref >60)
GFR, Estimated: 48 mL/min — ABNORMAL LOW (ref >60)
Glucose, Bld: 208 mg/dL — ABNORMAL HIGH (ref 70–99)
Glucose, Bld: 234 mg/dL — ABNORMAL HIGH (ref 70–99)
Phosphorus: 4.3 mg/dL (ref 2.5–4.6)
Phosphorus: 3.2 mg/dL (ref 2.5–4.6)
Potassium: 4.7 mmol/L (ref 3.5–5.1)
Potassium: 4.3 mmol/L (ref 3.5–5.1)
Sodium: 139 mmol/L (ref 135–145)
Sodium: 139 mmol/L (ref 135–145)

## 2021-09-09 LAB — ECHOCARDIOGRAM COMPLETE
AV Peak grad: 13 mmHg
Ao pk vel: 1.81 m/s
Area-P 1/2: 3.72 cm2
Height: 72 in
Weight: 5506.21 oz

## 2021-09-09 LAB — MAGNESIUM: Magnesium: 1.9 mg/dL (ref 1.7–2.4)

## 2021-09-09 LAB — CALCIUM, IONIZED: Calcium, Ionized, Serum: 4.2 mg/dL — ABNORMAL LOW (ref 4.5–5.6)

## 2021-09-09 LAB — GLUCOSE, CAPILLARY
Glucose-Capillary: 192 mg/dL — ABNORMAL HIGH (ref 70–99)
Glucose-Capillary: 164 mg/dL — ABNORMAL HIGH (ref 70–99)
Glucose-Capillary: 163 mg/dL — ABNORMAL HIGH (ref 70–99)
Glucose-Capillary: 182 mg/dL — ABNORMAL HIGH (ref 70–99)
Glucose-Capillary: 237 mg/dL — ABNORMAL HIGH (ref 70–99)
Glucose-Capillary: 242 mg/dL — ABNORMAL HIGH (ref 70–99)

## 2021-09-09 MED ORDER — SODIUM CHLORIDE 0.9% IV SOLUTION: Freq: Once | INTRAVENOUS | Status: AC

## 2021-09-09 MED ORDER — CALCIUM GLUCONATE-NACL 1-0.675 GM/50ML-% IV SOLN
1.0000 g | Freq: Once | INTRAVENOUS | Status: AC
Start: 2021-09-09 — End: 2021-09-09
  Administered 2021-09-09: 1000 mg via INTRAVENOUS
  Filled 2021-09-09 (×2): qty 50

## 2021-09-09 MED ORDER — SODIUM CHLORIDE 0.9% IV SOLUTION: Freq: Once | INTRAVENOUS | Status: DC

## 2021-09-09 MED ORDER — VITAL 1.5 CAL PO LIQD
1000.0000 mL | ORAL | Status: DC
  Administered 2021-09-09 – 2021-09-12 (×2): 1000 mL

## 2021-09-09 MED ORDER — PROSOURCE TF20 ENFIT COMPATIBL EN LIQD
60.0000 mL | Freq: Every day | ENTERAL | Status: DC
  Administered 2021-09-09 – 2021-09-12 (×7): 60 mL
  Filled 2021-09-09 (×9): qty 60

## 2021-09-09 MED ORDER — INSULIN GLARGINE-YFGN 100 UNIT/ML ~~LOC~~ SOLN
20.0000 [IU] | Freq: Every day | SUBCUTANEOUS | Status: DC
  Administered 2021-09-09: 20 [IU] via SUBCUTANEOUS
  Filled 2021-09-09 (×2): qty 0.2

## 2021-09-09 MED FILL — Ceftriaxone Sodium For Inj 2 GM: INTRAMUSCULAR | Qty: 2 | Status: AC

## 2021-09-09 NOTE — Progress Notes (Addendum)
eLink Physician-Brief Progress Note Patient Name: Troy Randall DOB: 01/02/1961 MRN: [id number]   Date of Service  09/09/2021  HPI/Events of Note  Maroon colored stools with anemia earlier, got 1 PRBC, now Hg > 8. On 3 pressors, requirements coming down.  IR notes reviewed.  WBC improving. K-5, on CRRT, continue current care plan, follow AM Hg, labs. Stable thrombocytopenia.    eICU Interventions  As above.  Follow ECHO reporting also, done on 4 th.      Intervention Category Intermediate Interventions: Diagnostic test evaluation;Other:;Thrombocytopenia - evaluation and management (Anemia)  Ranee Gosselin 09/09/2021, 1:53 AM

## 2021-09-09 NOTE — TOC Initial Note (Signed)
Transition of Care [facility]) - Initial/Assessment Note    Patient Details  Name: Troy Randall MRN: [id number] Date of Birth: 01-30-1960  Transition of Care [facility]) CM/SW Contact:    Tom-Johnson, Hershal Coria, RN Phone Number: 09/09/2021, 3:15 PM  Clinical Narrative:                  Patient is admitted for Lower GI Bleed. Had  Cholecystotomy tube placed on 09/07/21 for acute Cholecystitis. Currently intubated.  Son, Troy Randall and brother, Troy Randall at bedside.  Troy Randall states patient lives at home with his wife. Has two children. Patient is currently not employed, on disability. Has necessary DME's at home.  PCP is Benita Stabile, MD and uses [facility] in [place].  No TOC needs noted at this time. CM will continue to follow as patient progresses with care.     Barriers to Discharge: Continued Medical Work up   Patient Goals and CMS Choice Patient states their goals for this hospitalization and ongoing recovery are:: To return home      Expected Discharge Plan and Services     Discharge Planning Services: CM Consult   Living arrangements for the past 2 months: Single Family Home                                      Prior Living Arrangements/Services Living arrangements for the past 2 months: Single Family Home Lives with:: Spouse Patient language and need for interpreter reviewed:: Yes Do you feel safe going back to the place where you live?: Yes      Need for Family Participation in Patient Care: Yes (Comment) Care giver support system in place?: Yes (comment) Current home services: DME (All necessary DME's) Criminal Activity/Legal Involvement Pertinent to Current Situation/Hospitalization: No - Comment as needed  Activities of Daily Living Home Assistive Devices/Equipment: CBG Meter ADL Screening (condition at time of admission) Patient's cognitive ability adequate to safely complete daily activities?: Yes Is the patient deaf or have difficulty hearing?:  No Does the patient have difficulty seeing, even when wearing glasses/contacts?: No Does the patient have difficulty concentrating, remembering, or making decisions?: No Patient able to express need for assistance with ADLs?: Yes Does the patient have difficulty dressing or bathing?: No Independently performs ADLs?: Yes (appropriate for developmental age) Does the patient have difficulty walking or climbing stairs?: No Weakness of Legs: None Weakness of Arms/Hands: None  Permission Sought/Granted Permission sought to share information with : Case Manager, Family Supports Permission granted to share information with : Yes, Verbal Permission Granted              Emotional Assessment Appearance:: Appears stated age Attitude/Demeanor/Rapport: Unable to Assess (Intubated) Affect (typically observed): Unable to Assess (Intubated) Orientation: : Oriented to Self Alcohol / Substance Use: Not Applicable Psych Involvement: No (comment)  Admission diagnosis:  GI bleed [K92.2] Lower GI bleed [K92.2] Patient Active Problem List   Diagnosis Date Noted   AKI (acute kidney injury) (HCC)    Hemorrhagic shock (HCC)    Shock circulatory (HCC)    Acute blood loss anemia    Lower GI bleed 09/04/2021   Acute CVA (cerebrovascular accident) (HCC) 04/28/2021   Cannot sleep 08/26/2015   Chronic right-sided low back pain with sciatica 07/10/2015   Uncontrolled type 2 diabetes mellitus with hyperglycemia, without long-term current use of insulin (HCC) 06/19/2015   Hyperlipidemia 11/29/2013   Essential hypertension 11/28/2013  PCP:  Benita Stabile, MD Pharmacy:   [facility] [address] [place] - [address] [place] [address] [phone] [place] [address] [place] [address] Phone: [phone] Fax: [phone]     Social Determinants of Health (SDOH) Interventions    Readmission Risk Interventions     No data to display

## 2021-09-09 NOTE — Progress Notes (Signed)
Attending note: I have seen and examined the patient. History, labs and imaging reviewed.  61 year old with history of hypertension, hyperlipidemia, diabetes and CVA presenting with acute lower GI bleed and acute cholecystitis. Hospital course complicated with AKI requiring dialysis.  Remains on the ventilator CVVH initiated yesterday.  Pressor requirement slightly better today   Blood pressure 138/60, pulse (!) 108, temperature 98.6 F (37 C), resp. rate 20, height 6' (1.829 m), weight (!) 156.1 kg, SpO2 100 %. Gen:      No acute distress, sedated HEENT:  EOMI, sclera anicteric Neck:     No masses; no thyromegaly, ET tube Lungs:    Clear to auscultation bilaterally; normal respiratory effort CV:         Regular rate and rhythm; no murmurs Abd:      + bowel sounds; soft, non-tender; no palpable masses, no distension Ext:    1+ edema; adequate peripheral perfusion Skin:      Warm and dry; no rash Neuro: Sedated, unresponsive  Labs/Imaging personally reviewed, significant for Creatinine 2.09, albumin less than 1.5 WBC 24.6, hemoglobin 7.1, platelets 101 INR 2.0 Biliary culture showing abundant gram-negative rods.  Assessment/plan: Lower GIB s/p embolization Transfuse as needed,  Follow CBC q6 hrs   Cholecystitis s/p perc drain Cultures are pending On meropenem for broad antibiotic coverage  Septic, hemorrhagic shock On vaso, neo and vasopressin Started on stress dose steroids Wean as tolerated  AKI Started on CRRT 9/4 Start gentle fluid removal.  Discussed with nephrology  DM Increase semglee dose to 20  Family updated at bedside.  The patient is critically ill with multiple organ systems failure and requires high complexity decision making for assessment and support, frequent evaluation and titration of therapies, application of advanced monitoring technologies and extensive interpretation of multiple databases.  Critical care time - 35 mins. This represents my time  independent of the NPs time taking care of the pt.  Troy Greathouse MD [facility] 09/09/2021, 8:33 AM

## 2021-09-09 NOTE — Progress Notes (Signed)
eLink Physician-Brief Progress Note Patient Name: Troy Randall DOB: 1960/05/05 MRN: [id number]   Date of Service  09/09/2021  HPI/Events of Note  Patient admitted with lower GI bleeding s/p embolization, hemoglobin is 6.4 gm / dl currently,  no apparent active bleeding currently.  eICU Interventions  Transfuse one unit of PRBC.        Thomasene Lot Ia Leeb 09/09/2021, 9:40 PM

## 2021-09-09 NOTE — Progress Notes (Signed)
NAME:  Troy Randall, MRN:  [id number], DOB:  [birth year], LOS: 5 ADMISSION DATE:  09/04/2021, CONSULTATION DATE:  09/06/21 REFERRING MD:  [name], CHIEF COMPLAINT:  melena   History of Present Illness:   Troy Randall is an 61 y.o. male with prior history of HTN, HLD, DM, and CVA on ASA/ plavix who presented to [facility] on 8/31 after developing frequent 6-7 dark stools and feeling dizzy and lightheaded on standing.  No hx of NSAIDs.  FOBT positive and initial Hgb 12.2.    Patient admitted to [facility] with GI consulting.  Remained orthostatic with progressive decline in Hgb.  CTA abdomen did not show any active bleed, therefore underwent colonoscopy > no active bleed found but large clots throughout entire colon.  Then underwent EGD which showed a small hiatal hernia, erosive gastropathy with no stigmata of recent bleeding, and non-bleeding duodenal diverticulum.   Underwent then small bowel enteroscopy which noted intermittent blood pumping in the proximal jejunum but was unable to be reached despite multiple attempts, was injected and clip placed.  Repeat CTA repeated, which showed positive jejunal diverticular bleeding. Patient was transferred to [facility] for IR involvement. In IR active extravasation  at SMA territory, jejunal arcade branch, successful coil embolization.  PCCM was consulted for transfer and admission.  Pertinent  Medical History  HTN, HLD, DM, CVA on ASA/ plavix  Significant Hospital Events: Including procedures, antibiotic start and stop dates in addition to other pertinent events   8/31 presented to [facility], [name] Admit, Gi Consult 9/1 CTA> no evidence of active GI bleed, 2 U PRBC 9/2 2 U PRBC, colonoscopy > no active bleed found but large clots throughout entire colon. EGD> small hiatal hernia, erosive gastropathy with no stigmata of recent bleeding, and non-bleeding duodenal diverticulum.   Underwent then small bowel enteroscopy which noted intermittent blood pumping in the proximal jejunum  but was unable to be reached despite multiple attempts, was injected and clip placed. Repeat CTA repeated, which showed positive jejunal diverticular bleeding. IR> active extrav at [facility] territory, jejunal arcade branch, successful coil embo 9/3 Intubated. CTA> no evidence of active GI bleeding.  New finding of abnormal gallbladder with nondependent air, gallbladder wall air, pericholecystic and right upper quadrant information.  IR placed percutaneous cholecystotomy tube.   Interim History / Subjective:   Had two dark, maroon colored stools overnight and received 1 unit of PRBC.   Objective   Blood pressure 138/60, pulse (!) 102, temperature 98.6 F (37 C), resp. rate 20, height 6' (1.829 m), weight (!) 156.1 kg, SpO2 100 %. Vent Mode: PRVC FiO2 (%):  [40 %] 40 % Set Rate:  [18 bmp] 18 bmp Vt Set:  [620 mL] 620 mL PEEP:  [5 cmH20] 5 cmH20 Pressure Support:  [10 cmH20] 10 cmH20 Plateau Pressure:  [16 cmH20-22 cmH20] 18 cmH20   Intake/Output Summary (Last 24 hours) at 09/09/2021 2831 Last data filed at 09/09/2021 0600 Gross per 24 hour  Intake 5294.35 ml  Output 1051 ml  Net 4243.35 ml    Filed Weights   09/06/21 2009 09/08/21 0400 09/09/21 0500  Weight: (!) 147.5 kg (!) 151.8 kg (!) 156.1 kg   Examination: Gen: Sedated, in no distress HEENT: intubated, PERRL Lungs: course breath sounds throughout, in no respiratory distress CV:   tachycardic, regular Abd:  Obese, soft. Percutaneous cholecystostomy tube in place Ext: 1+ edema in bilateral/upper extremities Skin: Warm and dry; no rashes.  Neuro: Sedated, not following commands  Resolved Hospital Problem list  Assessment & Plan:   Mixed Shock - Septic and hemorrhagic In setting of cholecystitis and lower GI bleed. Continue to manage both as per below, he is requiring less pressor support, will continue to wean.  -Continues vasopressin, levophed, and neo, wean as able -Continue stress dose steroids.  -Treatment for sepsis  and hemorrhagic shock as per below -a-line in place -Continue hydrocortisone 100 mg q12h  Septic shock Cholecystitis s/p percutaneous cholecystostomy drain placement 9/3 percutaneous cholecystostomy drain placed.  -200 cc output.  Afebrile overnight. Wbc stable at 25. Cultures positive for gram-positive and gram-negative organisms with PMNs, awaiting final results.  Blood cultures negative at 24 hours.   -Continue meropenem day 2, narrow abx per cultures -Continue to monitor drain -Trend leukocytosis and monitor for continued fevers -Will need outpatient surgery follow up  Hemorrhagic shock ABLA secondary to lower GI bleed, likely diverticular Acute blood loss anemia, secondary to above - 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 Continues to have maroon colored stools, likely residual from prior bleed.  Given 1u PRBC overnight. Hgb stable at 7.5. -IR following, post procedure care per IR recs -Continue daily PPI for ulcer prophylaxis -Trend HGB, q6h -Transfuse PRBC if HBG less than 7 -Hold constipation meds  Oliguric AKI ATN 2/2 mixed shock Non-anion gap metabolic acidosis Hypocalcemia Bilateral nonobstructing nephrolithiasis - suspect chronic BPH Baseline 4 months ago around 1.0. Cr improved 3.5>2.09 Central line placed yesterday for CRRT. Appreciate Dr. Roni Bread assistance,  plan to start pulling fluid today. Minimal urinary output.  -Appreciate nephrologies assistance -Continue CRRT -Trend electrolytes, replete as needed -Bicarb ggt discontinued, continue to monitor acidosis.  -Hold flomax -Foley catheter in place, strict i/o  Acute hypoxemic respiratory failure Likely in setting of acute illness, shock, and hypervolemia as per above. Intubated 9/3. Hopeful to have improvement in respiratory status with CRRT.  -lung protected ventilation -VAP protocol -daily SBT/SAT -wean settings as tolerated -continue fentanyl, RASS goal of 0  History of CVA -hold asa  plavix in setting of acute GI bleed -monitor neuro exam per unit protocol.  DM2 Adding feeds today, will adjust with assistance of pharmacy.  -Blood Glucose goal 140-180. -SSI, semglee per pharmacy  HTN HLD -Hold home antihypertensives and statin  Pulmonary nodules in bilateral bases, L 37mm Family reports patient is a never smoker. Family does endorse dipping. -no further follow up needed for a low risk patient based on fleischner criteria  Best Practice (right click and "Reselect all SmartList Selections" daily)   Diet/type: tube feeds start today DVT prophylaxis: SCD GI prophylaxis: PPI Lines: a-line, foley.  Foley:  yes Code Status:  full code Last date of multidisciplinary goals of care discussion [Full scope- Discussed with wife [name] 9/2 . Family at bedside and updated on plan for today.   Thalia Bloodgood DO  Internal Medicine Resident PGY-3 [place]  Pager: [phone]

## 2021-09-09 NOTE — Progress Notes (Signed)
[facility] Progress Note  Name: Troy Randall MRN: [id number] DOB: 07/25/1960  Chief Complaint:  Dark stools on presentation  Subjective:  Seen and examined on CRRT; procedure supervised.  Spoke with primary team - pressor requirement is better.  They are hoping to start pulling some fluid today.  He had no UF with CRRT over 9/4 per my order to allow to optimize pressors and acidosis first.  He had 850 ml uop over 9/4.  He was started on CRRT on 9/4 PM after nontunneled catheter placement with critical care.  Spoke with his wife at bedside.   Review of systems:  Unable to obtain given intubated and sedated ------------- Background on referral:  Troy Randall is a 61 y.o. male with a history of hypertension, diabetes, nephrolithiasis, arthritis, and prior CVA who presented to the hospital with dark stools.  He had also complained of dizziness.  He had CTA which was without active bleed and had a colonoscopy without active bleed but clots noted throughout the entire colon per charting. Later underwent EGD with erosive gastropathy but no stigmata of bleeding.  Then had small bowel enteroscopy and later repeat CTA which was concerning for a bleed.  He was transferred from [facility] to [facility] and IR was consulted.  He underwent coil embolization. Per pulm he had hemorrhagic shock then developed septic shock.  Note he was also found to have cholecystitis and is s/p placement of a cholecystostomy tube.  He is on three pressors - levo, phenylephrine and vasopressin.  He has had respiratory failure and remains intubated. He has had worsening AKI and nephrology is consulted for assistance with management.  He had 400 mL uop over 9/3.    I spoke with his wife about the risks/benefits/indications for renal replacement therapy.  Primary team is at bedside placing a dialysis catheter.  The patient is on max levo at 40 mcg/min and phenylephrine at 400 mcg/min as well as bicarb gtt.  Team is  hopeful that improving his acidosis may reduce his pressor requirement.     Intake/Output Summary (Last 24 hours) at 09/09/2021 0825 Last data filed at 09/09/2021 0700 Gross per 24 hour  Intake 4922.98 ml  Output 1051 ml  Net 3871.98 ml    Vitals:  Vitals:   09/09/21 0600 09/09/21 0615 09/09/21 0630 09/09/21 0721  BP:      Pulse: (!) 102  (!) 108   Resp:      Temp: 98.6 F (37 C) 98.6 F (37 C) 98.6 F (37 C) 98.6 F (37 C)  TempSrc:      SpO2: 100%  100%   Weight:      Height:         Physical Exam:  General:  adult male critically ill   HEENT: NCAT Eyes: closed; does not track Neck: increased neck circumference; trachea midline Heart: s1S2 no rub Lungs: coarse mechanical breath sounds; on FIO2 40 and PEEP 5 Abdomen: soft/obese  Extremities: 2+ edema lower extremities  Neuro: sedation currently running Access - RIJ nontunneled catheter in place  Medications reviewed   Labs:     Latest Ref Rng & Units 09/09/2021    4:26 AM 09/08/2021    7:07 PM 09/08/2021    6:08 PM  BMP  Glucose 70 - 99 mg/dL 782   423   BUN 6 - 20 mg/dL 38   48   Creatinine 5.36 - 1.24 mg/dL 1.44   3.15   Sodium 400 - 145  mmol/L 139  136  137   Potassium 3.5 - 5.1 mmol/L 4.7  5.0  5.0   Chloride 98 - 111 mmol/L 106   110   CO2 22 - 32 mmol/L 22   19   Calcium 8.9 - 10.3 mg/dL 6.6   6.0      Assessment/Plan:   # AKI  - secondary to ischemic ATN in the setting of multifactorial shock.  He has received clinically indicated contrast.  He was started on CRRT on 9/4 PM after nontunneled catheter placement with critical care to improve acidosis - Continue CRRT   - Start removing fluid UF goal keep even to net neg 50 ml/hr as tolerated  - 4K fluids    # Shock - mixed Septic and hemorrhagic shock  - Pressors per primary team  - source control of GI bleed and infection as below - abx per primary team    # Acute hypoxic respiratory failure  - on mechanical ventilation per primary team    #  Acute GI bleed - s/p coil embolization  - s/p multiple studies and interventions as above - on PPI - transfusions per primary team    # Acute blood loss anemia  - transfusions per primary team    # Metabolic acidosis - secondary to AKI; as above - on CRRT    # Cholecystitis  - s/p cholecystomy drain placement  - abx per primary team   Disposition - continue ICU monitoring  Estanislado Emms, MD 09/09/2021 8:39 AM

## 2021-09-09 NOTE — Progress Notes (Signed)
Referring Physician(s): Dale  Supervising Physician: Aletta Edouard  Patient Status:  [facility] - In-pt  Chief Complaint:  Perc chole tube placed 09/07/21 MESENTERIC ARTERIOGRAPHY COIL EMBOLIZATION of SMA TERRITORY, JEJUNAL ARCADE BRANCH 09/06/21  Subjective:  Vent; intubated No response  Transfused daily Hg at 7.1 today (7.4) Maroon stool last pm per RN  Perc chole intact OP bile  Family at bedside    Allergies: Metformin and related and Penicillins  Medications: Prior to Admission medications   Medication Sig Start Date End Date Taking? Authorizing Provider  amLODipine (NORVASC) 10 MG tablet Take 1 tablet (10 mg total) by mouth daily. 06/15/17  Yes Caren Macadam, MD  aspirin EC 81 MG tablet Take 81 mg by mouth daily. Swallow whole.   Yes [provider]  clopidogrel (PLAVIX) 75 MG tablet Take 1 tablet (75 mg total) by mouth daily. 04/30/21  Yes Little Ishikawa, MD  glipiZIDE (GLUCOTROL XL) 10 MG 24 hr tablet Take 1 tablet (10 mg total) by mouth daily with breakfast. 04/16/17  Yes Hagler, Apolonio Schneiders, MD  levocetirizine (XYZAL) 5 MG tablet Take 1 tablet by mouth at bedtime.   Yes [provider]  lisinopril (PRINIVIL,ZESTRIL) 40 MG tablet Take 1 tablet (40 mg total) by mouth daily. 05/12/17  Yes Caren Macadam, MD  metFORMIN (GLUCOPHAGE-XR) 500 MG 24 hr tablet Take 500-1,000 mg by mouth in the morning and at bedtime. 02/09/21  Yes [provider]  rosuvastatin (CRESTOR) 40 MG tablet Take 1 tablet (40 mg total) by mouth daily. 04/29/21  Yes Little Ishikawa, MD  tamsulosin (FLOMAX) 0.4 MG CAPS capsule Take 0.4 mg by mouth daily. 02/12/21  Yes [provider]  traZODone (DESYREL) 150 MG tablet Take 150 mg by mouth at bedtime. 02/04/21  Yes [provider]  TRESIBA FLEXTOUCH 200 UNIT/ML FlexTouch Pen Inject 20 Units into the skin in the morning and at bedtime. 03/14/21  Yes [provider]  BD VEO INSULIN SYRINGE U/F 31G X  15/64" 1 ML MISC  USE AS DIRECTED 06/08/17   Caren Macadam, MD  glucose blood (ONETOUCH VERIO) test strip TEST twice a day 05/04/17   Caren Macadam, MD  Insulin Pen Needle (NOVOTWIST) 32G X 5 MM MISC Use two daily to inject Victoza and Toujeo. 04/25/15   Elayne Snare, MD  [facility] DELICA LANCETS 59F MISC Use to check blood sugar once a day dx code E11.65 11/21/14   Elayne Snare, MD     Vital Signs: BP 101/72   Pulse 93   Temp 98.4 F (36.9 C)   Resp 18   Ht 6' (1.829 m)   Wt (!) 344 lb 2.2 oz (156.1 kg)   SpO2 100%   BMI 46.67 kg/m   Physical Exam Vitals reviewed.  Skin:    General: Skin is warm.     Comments: Site of perc chole drain is clean and dry NT no bleeding OP into bag is bile 200 cc  today ABUNDANT GRAM NEGATIVE RODS  IDENTIFICATION AND SUSCEPTIBILITIES TO FOLLOW  NO ANAEROBES ISOLATED; CULTURE IN PROGRESS FOR 5 DAYS     Imaging: ECHOCARDIOGRAM COMPLETE  Result Date: 09/09/2021    ECHOCARDIOGRAM REPORT   Patient Name:   Troy Randall Date of Exam: 09/09/2021 Medical Rec #:  [id number]       Height:       72.0 in Accession #:    [id number]      Weight:       344.1 lb Date  of Birth:  09/30/1960       BSA:          2.683 m Patient Age:    61 years        BP:           120/78 mmHg Patient Gender: M               HR:           95 bpm. Exam Location:  Inpatient Procedure: 2D Echo, Cardiac Doppler and Color Doppler Indications:    Acute respiratory distress  History:        Patient has prior history of Echocardiogram examinations, most                 recent 04/29/2021. Risk Factors:Hypertension, Diabetes and                 Dyslipidemia.  Sonographer:    Jefferey Pica Referring Phys: [phone] MURALI RAMASWAMY  Sonographer Comments: Unable to use Definity due to no IV access. IMPRESSIONS  1. Left ventricular ejection fraction, by estimation, is 65 to 70%. The left ventricle has hyperdynamic function. The left ventricle has no regional wall motion abnormalities. Left ventricular  diastolic parameters are indeterminate.  2. Right ventricular systolic function is normal. The right ventricular size is normal. Tricuspid regurgitation signal is inadequate for assessing PA pressure.  3. The mitral valve is normal in structure. No evidence of mitral valve regurgitation.  4. The aortic valve is tricuspid. There is mild calcification of the aortic valve. There is mild thickening of the aortic valve. Aortic valve regurgitation is not visualized. Aortic valve sclerosis is present, with no evidence of aortic valve stenosis. Comparison(s): No significant change from prior study. Prior images reviewed side by side. FINDINGS  Left Ventricle: Left ventricular ejection fraction, by estimation, is 65 to 70%. The left ventricle has hyperdynamic function. The left ventricle has no regional wall motion abnormalities. The left ventricular internal cavity size was normal in size. Suboptimal image quality limits for assessment of left ventricular hypertrophy. Left ventricular diastolic parameters are indeterminate. Right Ventricle: The right ventricular size is normal. Right vetricular wall thickness was not well visualized. Right ventricular systolic function is normal. Tricuspid regurgitation signal is inadequate for assessing PA pressure. Left Atrium: Left atrial size was normal in size. Right Atrium: Right atrial size was normal in size. Pericardium: There is no evidence of pericardial effusion. Mitral Valve: The mitral valve is normal in structure. No evidence of mitral valve regurgitation. Tricuspid Valve: The tricuspid valve is normal in structure. Tricuspid valve regurgitation is not demonstrated. Aortic Valve: The aortic valve is tricuspid. There is mild calcification of the aortic valve. There is mild thickening of the aortic valve. Aortic valve regurgitation is not visualized. Aortic valve sclerosis is present, with no evidence of aortic valve stenosis. Aortic valve peak gradient measures 13.0 mmHg.  Pulmonic Valve: The pulmonic valve was not well visualized. Aorta: The aortic root was not well visualized. Venous: The inferior vena cava was not well visualized. IAS/Shunts: The interatrial septum was not well visualized.   Diastology LV e' medial:    4.73 cm/s LV E/e' medial:  12.7 LV e' lateral:   5.40 cm/s LV E/e' lateral: 11.1  LEFT ATRIUM             Index        RIGHT ATRIUM           Index LA Vol (A2C):   32.9 ml  12.26 ml/m  RA Area:     12.70 cm LA Vol (A4C):   35.4 ml 13.19 ml/m  RA Volume:   24.50 ml  9.13 ml/m LA Biplane Vol: 35.5 ml 13.23 ml/m  AORTIC VALVE              PULMONIC VALVE AV Vmax:      180.50 cm/s PV Vmax:       0.84 m/s AV Peak Grad: 13.0 mmHg   PV Peak grad:  2.8 mmHg LVOT Vmax:    142.00 cm/s LVOT Vmean:   95.200 cm/s LVOT VTI:     0.226 m MITRAL VALVE               TRICUSPID VALVE MV Area (PHT): 3.72 cm    TR Peak grad:   8.8 mmHg MV Decel Time: 204 msec    TR Vmax:        148.00 cm/s MV E velocity: 60.00 cm/s MV A velocity: 53.60 cm/s  SHUNTS MV E/A ratio:  1.12        Systemic VTI: 0.23 m Mihai Croitoru MD Electronically signed by Sanda Klein MD Signature Date/Time: 09/09/2021/11:54:05 AM    Final    IR Angiogram Visceral Selective  Result Date: 09/08/2021 INDICATION: History of GI bleeding within the proximal jejunum secondary to presumed small bowel diverticuli, post attempted endoscopic clipping, mesenteric arteriogram and percutaneous coil embolization of the distal arcade supplying the small bowel marked by the endoscopy clip. Unfortunately, the patient again was found to be hemodynamically unstable today with concern for recurrent bleeding though preceding abdominal CT performed 09/07/2021 was negative for recurrent GI bleeding. As the area of the endoscopy clip and embolization coils does NOT appear to correlate with the area of bleeding demonstrated on preceding abdominal CT performed 09/06/2021 or proceed with repeat mesenteric arteriogram and potential  embolization. Additionally, preceding abdominal CT demonstrates air, wall thickening and fluid surrounding the gallbladder worrisome for concomitant acute cholecystitis, supported by patient's elevated white blood cell count, and as such will also proceed with image guided placement of a cholecystostomy tube. EXAM: 1. ULTRASOUND GUIDANCE FOR ARTERIAL ACCESS 2. SELECTIVE SUPERIOR MESENTERIC ARTERIOGRAM 3. SELECTIVE ARTERIOGRAM OF TWO LEFT-SIDED JEJUNAL BRANCHES OF THE SMA INCLUDING SUB SELECTIVE ARTERIOGRAMS OF THEIR DISTAL ARCADE BRANCHES. 4. ULTRASOUND AND FLUOROSCOPIC GUIDED CHOLECYSTOSTOMY TUBE PLACEMENT COMPARISON:  Mesenteric arteriogram and percutaneous coil embolization-09/06/2021 CTA abdomen pelvis-09/07/2021; 09/06/2021; 09/05/2021 MEDICATIONS: Rocephin 2 g IV; the antibiotics were administered with an appropriate time frame prior to the initiation of the procedure. ANESTHESIA/SEDATION: Moderate (conscious) sedation was employed during this procedure. A total of Versed 4 mg and Fentanyl 200 mcg was administered intravenously. Moderate Sedation Time: 70 minutes. The patient's level of consciousness and vital signs were monitored continuously by radiology nursing throughout the procedure under my direct supervision. CONTRAST:  70 cc Omnipaque 300 FLUOROSCOPY TIME:  16 minutes, 12 seconds (3,893 mGy) COMPLICATIONS: None immediate. PROCEDURE: Informed consent was obtained from the patient's wife following explanation of the procedure, risks, benefits and alternatives. All questions were addressed. A time out was performed prior to the initiation of the procedure. Maximal barrier sterile technique utilized including caps, mask, sterile gowns, sterile gloves, large sterile drape, hand hygiene, and Betadine prep. The right femoral head was marked fluoroscopically. Under sterile conditions and local anesthesia, the right common femoral artery access was performed with a micropuncture needle. Under direct ultrasound  guidance, the right common femoral was accessed with a micropuncture kit. An ultrasound image was saved for documentation  purposes. This allowed for placement of a 5-French vascular sheath. A limited arteriogram was performed through the side arm of the sheath confirming appropriate access within the right common femoral artery. Over a Bentson wire, a Mickelson catheter was advanced the caudal aspect of the thoracic aorta where was reformed, back bled and flushed. The Mickelson catheter was then utilized to select the superior mesenteric artery and a selective superior mesenteric arteriogram was performed. With the use of a soft synchro microwire, a cantata microcatheter was utilized to select a more inferior division of a left-sided jejunal artery and a selective arteriogram was performed. The microcatheter was then advanced into the vessel's superiorly directed distal arcade and a sub selective arteriogram was performed. As no active bleeding was identified, yet this vessel appeared to supply the area contrast extravasation demonstrated on preceding CTA and as the bleeding can be intermittently identified angiographically, the decision was made to proceed with cholecystostomy tube placement. _________________________________________________________ Under direct ultrasound guidance, the gallbladder was accessed with a 21 gauge needle. Appropriate access to the gallbladder was confirmed with a reflux purulent appearing bile as well as limited contrast injection. An ultrasound image was saved procedural documentation purposes. Wire was coiled within the gallbladder lumen and under intermittent fluoroscopic guidance, the access needle was exchanged for a Accustick set. Again, contrast injection confirmed appropriate positioning. Next, over a short Amplatz wire, the outer Accustick catheter was exchanged for a 10 [place] dilator and ultimately for a 10 [place] cholecystostomy tube with end coiled and locked within the  gallbladder lumen. Limited contrast injection confirmed appropriate positioning. Postprocedural spot fluoroscopic image was obtained. A small amount of aspirated bile was capped and sent to the laboratory for analysis. The cholecystostomy tube was flushed with a small amount of saline, connected to a gravity bag and secured at the skin entrance site with an interrupted suture. _________________________________________________________ Attention was once again paid towards the mesenteric arteriogram. Repeat sub selective injection was performed of the left-sided jejunal branch of the SMA however again was negative for discrete area of active extravasation or vessel irregularity. As such, the microcatheter was utilized to select the more superior left-sided jejunal branch of the SMA and a repeat selective arteriogram was performed. Again, the microcatheter was advanced to select both the superior and inferior divisions of this left-sided jejunal artery and sub selective arteriograms were performed. Finally, the microcatheter was utilized to select the more inferior left-sided jejunal branch and a completion arteriogram was performed. Images were reviewed and the procedure was terminated. All wires, catheters and sheaths were removed from the patient. Hemostasis was achieved at the right groin access site with deployment of an ExoSeal closure device. The patient tolerated the procedure well without immediate post procedural complication. FINDINGS: Repeat exhaustive superior mesenteric arteriogram with selective arteriograms of 2 left-sided jejunal branch of the SMA as well as sub selective injections of its distal arcade was negative for discrete area of active extravasation or vessel irregularity. Following cholecystostomy tube placement, cholecystostomy tube is appropriately positioned with end coiled and locked within the gallbladder lumen. IMPRESSION: 1. Despite exhaustive superior mesenteric arteriogram with  selective arteriograms of two left-sided jejunal branches of the SMA as well as its distal arcade, no discrete area of active extravasation or vessel irregularity is identified. No additional embolization performed. 2. Successful image guided placement of a cholecystostomy tube yielding a return of purulent, foul-smelling bile. A sample of aspirated bile was capped and sent to the laboratory analysis. PLAN: - The patient is to remain  flat for 4 hours with right leg straight. - If there remains clinical concern for residual or recurrent GI bleeding repeat endoscopy could be performed as indicated. Ultimately, patient may benefit from formal surgical consultation. Electronically Signed   By: Sandi Mariscal M.D.   On: 09/08/2021 14:50   IR US Guide Vasc Access Right  Result Date: 09/08/2021 INDICATION: History of GI bleeding within the proximal jejunum secondary to presumed small bowel diverticuli, post attempted endoscopic clipping, mesenteric arteriogram and percutaneous coil embolization of the distal arcade supplying the small bowel marked by the endoscopy clip. Unfortunately, the patient again was found to be hemodynamically unstable today with concern for recurrent bleeding though preceding abdominal CT performed 09/07/2021 was negative for recurrent GI bleeding. As the area of the endoscopy clip and embolization coils does NOT appear to correlate with the area of bleeding demonstrated on preceding abdominal CT performed 09/06/2021 or proceed with repeat mesenteric arteriogram and potential embolization. Additionally, preceding abdominal CT demonstrates air, wall thickening and fluid surrounding the gallbladder worrisome for concomitant acute cholecystitis, supported by patient's elevated white blood cell count, and as such will also proceed with image guided placement of a cholecystostomy tube. EXAM: 1. ULTRASOUND GUIDANCE FOR ARTERIAL ACCESS 2. SELECTIVE SUPERIOR MESENTERIC ARTERIOGRAM 3. SELECTIVE ARTERIOGRAM  OF TWO LEFT-SIDED JEJUNAL BRANCHES OF THE SMA INCLUDING SUB SELECTIVE ARTERIOGRAMS OF THEIR DISTAL ARCADE BRANCHES. 4. ULTRASOUND AND FLUOROSCOPIC GUIDED CHOLECYSTOSTOMY TUBE PLACEMENT COMPARISON:  Mesenteric arteriogram and percutaneous coil embolization-09/06/2021 CTA abdomen pelvis-09/07/2021; 09/06/2021; 09/05/2021 MEDICATIONS: Rocephin 2 g IV; the antibiotics were administered with an appropriate time frame prior to the initiation of the procedure. ANESTHESIA/SEDATION: Moderate (conscious) sedation was employed during this procedure. A total of Versed 4 mg and Fentanyl 200 mcg was administered intravenously. Moderate Sedation Time: 70 minutes. The patient's level of consciousness and vital signs were monitored continuously by radiology nursing throughout the procedure under my direct supervision. CONTRAST:  70 cc Omnipaque 300 FLUOROSCOPY TIME:  16 minutes, 12 seconds (0,973 mGy) COMPLICATIONS: None immediate. PROCEDURE: Informed consent was obtained from the patient's wife following explanation of the procedure, risks, benefits and alternatives. All questions were addressed. A time out was performed prior to the initiation of the procedure. Maximal barrier sterile technique utilized including caps, mask, sterile gowns, sterile gloves, large sterile drape, hand hygiene, and Betadine prep. The right femoral head was marked fluoroscopically. Under sterile conditions and local anesthesia, the right common femoral artery access was performed with a micropuncture needle. Under direct ultrasound guidance, the right common femoral was accessed with a micropuncture kit. An ultrasound image was saved for documentation purposes. This allowed for placement of a 5-French vascular sheath. A limited arteriogram was performed through the side arm of the sheath confirming appropriate access within the right common femoral artery. Over a Bentson wire, a Mickelson catheter was advanced the caudal aspect of the thoracic aorta  where was reformed, back bled and flushed. The Mickelson catheter was then utilized to select the superior mesenteric artery and a selective superior mesenteric arteriogram was performed. With the use of a soft synchro microwire, a cantata microcatheter was utilized to select a more inferior division of a left-sided jejunal artery and a selective arteriogram was performed. The microcatheter was then advanced into the vessel's superiorly directed distal arcade and a sub selective arteriogram was performed. As no active bleeding was identified, yet this vessel appeared to supply the area contrast extravasation demonstrated on preceding CTA and as the bleeding can be intermittently identified angiographically, the decision was  made to proceed with cholecystostomy tube placement. _________________________________________________________ Under direct ultrasound guidance, the gallbladder was accessed with a 21 gauge needle. Appropriate access to the gallbladder was confirmed with a reflux purulent appearing bile as well as limited contrast injection. An ultrasound image was saved procedural documentation purposes. Wire was coiled within the gallbladder lumen and under intermittent fluoroscopic guidance, the access needle was exchanged for a Accustick set. Again, contrast injection confirmed appropriate positioning. Next, over a short Amplatz wire, the outer Accustick catheter was exchanged for a 10 [place] dilator and ultimately for a 10 [place] cholecystostomy tube with end coiled and locked within the gallbladder lumen. Limited contrast injection confirmed appropriate positioning. Postprocedural spot fluoroscopic image was obtained. A small amount of aspirated bile was capped and sent to the laboratory for analysis. The cholecystostomy tube was flushed with a small amount of saline, connected to a gravity bag and secured at the skin entrance site with an interrupted suture.  _________________________________________________________ Attention was once again paid towards the mesenteric arteriogram. Repeat sub selective injection was performed of the left-sided jejunal branch of the SMA however again was negative for discrete area of active extravasation or vessel irregularity. As such, the microcatheter was utilized to select the more superior left-sided jejunal branch of the SMA and a repeat selective arteriogram was performed. Again, the microcatheter was advanced to select both the superior and inferior divisions of this left-sided jejunal artery and sub selective arteriograms were performed. Finally, the microcatheter was utilized to select the more inferior left-sided jejunal branch and a completion arteriogram was performed. Images were reviewed and the procedure was terminated. All wires, catheters and sheaths were removed from the patient. Hemostasis was achieved at the right groin access site with deployment of an ExoSeal closure device. The patient tolerated the procedure well without immediate post procedural complication. FINDINGS: Repeat exhaustive superior mesenteric arteriogram with selective arteriograms of 2 left-sided jejunal branch of the SMA as well as sub selective injections of its distal arcade was negative for discrete area of active extravasation or vessel irregularity. Following cholecystostomy tube placement, cholecystostomy tube is appropriately positioned with end coiled and locked within the gallbladder lumen. IMPRESSION: 1. Despite exhaustive superior mesenteric arteriogram with selective arteriograms of two left-sided jejunal branches of the SMA as well as its distal arcade, no discrete area of active extravasation or vessel irregularity is identified. No additional embolization performed. 2. Successful image guided placement of a cholecystostomy tube yielding a return of purulent, foul-smelling bile. A sample of aspirated bile was capped and sent to the  laboratory analysis. PLAN: - The patient is to remain flat for 4 hours with right leg straight. - If there remains clinical concern for residual or recurrent GI bleeding repeat endoscopy could be performed as indicated. Ultimately, patient may benefit from formal surgical consultation. Electronically Signed   By: Sandi Mariscal M.D.   On: 09/08/2021 14:50   IR US Guide Bx Asp/Drain  Result Date: 09/08/2021 INDICATION: History of GI bleeding within the proximal jejunum secondary to presumed small bowel diverticuli, post attempted endoscopic clipping, mesenteric arteriogram and percutaneous coil embolization of the distal arcade supplying the small bowel marked by the endoscopy clip. Unfortunately, the patient again was found to be hemodynamically unstable today with concern for recurrent bleeding though preceding abdominal CT performed 09/07/2021 was negative for recurrent GI bleeding. As the area of the endoscopy clip and embolization coils does NOT appear to correlate with the area of bleeding demonstrated on preceding abdominal CT performed 09/06/2021 or proceed  with repeat mesenteric arteriogram and potential embolization. Additionally, preceding abdominal CT demonstrates air, wall thickening and fluid surrounding the gallbladder worrisome for concomitant acute cholecystitis, supported by patient's elevated white blood cell count, and as such will also proceed with image guided placement of a cholecystostomy tube. EXAM: 1. ULTRASOUND GUIDANCE FOR ARTERIAL ACCESS 2. SELECTIVE SUPERIOR MESENTERIC ARTERIOGRAM 3. SELECTIVE ARTERIOGRAM OF TWO LEFT-SIDED JEJUNAL BRANCHES OF THE SMA INCLUDING SUB SELECTIVE ARTERIOGRAMS OF THEIR DISTAL ARCADE BRANCHES. 4. ULTRASOUND AND FLUOROSCOPIC GUIDED CHOLECYSTOSTOMY TUBE PLACEMENT COMPARISON:  Mesenteric arteriogram and percutaneous coil embolization-09/06/2021 CTA abdomen pelvis-09/07/2021; 09/06/2021; 09/05/2021 MEDICATIONS: Rocephin 2 g IV; the antibiotics were administered with  an appropriate time frame prior to the initiation of the procedure. ANESTHESIA/SEDATION: Moderate (conscious) sedation was employed during this procedure. A total of Versed 4 mg and Fentanyl 200 mcg was administered intravenously. Moderate Sedation Time: 70 minutes. The patient's level of consciousness and vital signs were monitored continuously by radiology nursing throughout the procedure under my direct supervision. CONTRAST:  70 cc Omnipaque 300 FLUOROSCOPY TIME:  16 minutes, 12 seconds (6,301 mGy) COMPLICATIONS: None immediate. PROCEDURE: Informed consent was obtained from the patient's wife following explanation of the procedure, risks, benefits and alternatives. All questions were addressed. A time out was performed prior to the initiation of the procedure. Maximal barrier sterile technique utilized including caps, mask, sterile gowns, sterile gloves, large sterile drape, hand hygiene, and Betadine prep. The right femoral head was marked fluoroscopically. Under sterile conditions and local anesthesia, the right common femoral artery access was performed with a micropuncture needle. Under direct ultrasound guidance, the right common femoral was accessed with a micropuncture kit. An ultrasound image was saved for documentation purposes. This allowed for placement of a 5-French vascular sheath. A limited arteriogram was performed through the side arm of the sheath confirming appropriate access within the right common femoral artery. Over a Bentson wire, a Mickelson catheter was advanced the caudal aspect of the thoracic aorta where was reformed, back bled and flushed. The Mickelson catheter was then utilized to select the superior mesenteric artery and a selective superior mesenteric arteriogram was performed. With the use of a soft synchro microwire, a cantata microcatheter was utilized to select a more inferior division of a left-sided jejunal artery and a selective arteriogram was performed. The microcatheter  was then advanced into the vessel's superiorly directed distal arcade and a sub selective arteriogram was performed. As no active bleeding was identified, yet this vessel appeared to supply the area contrast extravasation demonstrated on preceding CTA and as the bleeding can be intermittently identified angiographically, the decision was made to proceed with cholecystostomy tube placement. _________________________________________________________ Under direct ultrasound guidance, the gallbladder was accessed with a 21 gauge needle. Appropriate access to the gallbladder was confirmed with a reflux purulent appearing bile as well as limited contrast injection. An ultrasound image was saved procedural documentation purposes. Wire was coiled within the gallbladder lumen and under intermittent fluoroscopic guidance, the access needle was exchanged for a Accustick set. Again, contrast injection confirmed appropriate positioning. Next, over a short Amplatz wire, the outer Accustick catheter was exchanged for a 10 [place] dilator and ultimately for a 10 [place] cholecystostomy tube with end coiled and locked within the gallbladder lumen. Limited contrast injection confirmed appropriate positioning. Postprocedural spot fluoroscopic image was obtained. A small amount of aspirated bile was capped and sent to the laboratory for analysis. The cholecystostomy tube was flushed with a small amount of saline, connected to a gravity bag and secured at the  skin entrance site with an interrupted suture. _________________________________________________________ Attention was once again paid towards the mesenteric arteriogram. Repeat sub selective injection was performed of the left-sided jejunal branch of the SMA however again was negative for discrete area of active extravasation or vessel irregularity. As such, the microcatheter was utilized to select the more superior left-sided jejunal branch of the SMA and a repeat selective  arteriogram was performed. Again, the microcatheter was advanced to select both the superior and inferior divisions of this left-sided jejunal artery and sub selective arteriograms were performed. Finally, the microcatheter was utilized to select the more inferior left-sided jejunal branch and a completion arteriogram was performed. Images were reviewed and the procedure was terminated. All wires, catheters and sheaths were removed from the patient. Hemostasis was achieved at the right groin access site with deployment of an ExoSeal closure device. The patient tolerated the procedure well without immediate post procedural complication. FINDINGS: Repeat exhaustive superior mesenteric arteriogram with selective arteriograms of 2 left-sided jejunal branch of the SMA as well as sub selective injections of its distal arcade was negative for discrete area of active extravasation or vessel irregularity. Following cholecystostomy tube placement, cholecystostomy tube is appropriately positioned with end coiled and locked within the gallbladder lumen. IMPRESSION: 1. Despite exhaustive superior mesenteric arteriogram with selective arteriograms of two left-sided jejunal branches of the SMA as well as its distal arcade, no discrete area of active extravasation or vessel irregularity is identified. No additional embolization performed. 2. Successful image guided placement of a cholecystostomy tube yielding a return of purulent, foul-smelling bile. A sample of aspirated bile was capped and sent to the laboratory analysis. PLAN: - The patient is to remain flat for 4 hours with right leg straight. - If there remains clinical concern for residual or recurrent GI bleeding repeat endoscopy could be performed as indicated. Ultimately, patient may benefit from formal surgical consultation. Electronically Signed   By: Sandi Mariscal M.D.   On: 09/08/2021 14:50   IR Guided Drain W Catheter Placement  Result Date: 09/08/2021 INDICATION:  History of GI bleeding within the proximal jejunum secondary to presumed small bowel diverticuli, post attempted endoscopic clipping, mesenteric arteriogram and percutaneous coil embolization of the distal arcade supplying the small bowel marked by the endoscopy clip. Unfortunately, the patient again was found to be hemodynamically unstable today with concern for recurrent bleeding though preceding abdominal CT performed 09/07/2021 was negative for recurrent GI bleeding. As the area of the endoscopy clip and embolization coils does NOT appear to correlate with the area of bleeding demonstrated on preceding abdominal CT performed 09/06/2021 or proceed with repeat mesenteric arteriogram and potential embolization. Additionally, preceding abdominal CT demonstrates air, wall thickening and fluid surrounding the gallbladder worrisome for concomitant acute cholecystitis, supported by patient's elevated white blood cell count, and as such will also proceed with image guided placement of a cholecystostomy tube. EXAM: 1. ULTRASOUND GUIDANCE FOR ARTERIAL ACCESS 2. SELECTIVE SUPERIOR MESENTERIC ARTERIOGRAM 3. SELECTIVE ARTERIOGRAM OF TWO LEFT-SIDED JEJUNAL BRANCHES OF THE SMA INCLUDING SUB SELECTIVE ARTERIOGRAMS OF THEIR DISTAL ARCADE BRANCHES. 4. ULTRASOUND AND FLUOROSCOPIC GUIDED CHOLECYSTOSTOMY TUBE PLACEMENT COMPARISON:  Mesenteric arteriogram and percutaneous coil embolization-09/06/2021 CTA abdomen pelvis-09/07/2021; 09/06/2021; 09/05/2021 MEDICATIONS: Rocephin 2 g IV; the antibiotics were administered with an appropriate time frame prior to the initiation of the procedure. ANESTHESIA/SEDATION: Moderate (conscious) sedation was employed during this procedure. A total of Versed 4 mg and Fentanyl 200 mcg was administered intravenously. Moderate Sedation Time: 70 minutes. The patient's level of consciousness and vital  signs were monitored continuously by radiology nursing throughout the procedure under my direct  supervision. CONTRAST:  70 cc Omnipaque 300 FLUOROSCOPY TIME:  16 minutes, 12 seconds (7,253 mGy) COMPLICATIONS: None immediate. PROCEDURE: Informed consent was obtained from the patient's wife following explanation of the procedure, risks, benefits and alternatives. All questions were addressed. A time out was performed prior to the initiation of the procedure. Maximal barrier sterile technique utilized including caps, mask, sterile gowns, sterile gloves, large sterile drape, hand hygiene, and Betadine prep. The right femoral head was marked fluoroscopically. Under sterile conditions and local anesthesia, the right common femoral artery access was performed with a micropuncture needle. Under direct ultrasound guidance, the right common femoral was accessed with a micropuncture kit. An ultrasound image was saved for documentation purposes. This allowed for placement of a 5-French vascular sheath. A limited arteriogram was performed through the side arm of the sheath confirming appropriate access within the right common femoral artery. Over a Bentson wire, a Mickelson catheter was advanced the caudal aspect of the thoracic aorta where was reformed, back bled and flushed. The Mickelson catheter was then utilized to select the superior mesenteric artery and a selective superior mesenteric arteriogram was performed. With the use of a soft synchro microwire, a cantata microcatheter was utilized to select a more inferior division of a left-sided jejunal artery and a selective arteriogram was performed. The microcatheter was then advanced into the vessel's superiorly directed distal arcade and a sub selective arteriogram was performed. As no active bleeding was identified, yet this vessel appeared to supply the area contrast extravasation demonstrated on preceding CTA and as the bleeding can be intermittently identified angiographically, the decision was made to proceed with cholecystostomy tube placement.  _________________________________________________________ Under direct ultrasound guidance, the gallbladder was accessed with a 21 gauge needle. Appropriate access to the gallbladder was confirmed with a reflux purulent appearing bile as well as limited contrast injection. An ultrasound image was saved procedural documentation purposes. Wire was coiled within the gallbladder lumen and under intermittent fluoroscopic guidance, the access needle was exchanged for a Accustick set. Again, contrast injection confirmed appropriate positioning. Next, over a short Amplatz wire, the outer Accustick catheter was exchanged for a 10 [place] dilator and ultimately for a 10 [place] cholecystostomy tube with end coiled and locked within the gallbladder lumen. Limited contrast injection confirmed appropriate positioning. Postprocedural spot fluoroscopic image was obtained. A small amount of aspirated bile was capped and sent to the laboratory for analysis. The cholecystostomy tube was flushed with a small amount of saline, connected to a gravity bag and secured at the skin entrance site with an interrupted suture. _________________________________________________________ Attention was once again paid towards the mesenteric arteriogram. Repeat sub selective injection was performed of the left-sided jejunal branch of the SMA however again was negative for discrete area of active extravasation or vessel irregularity. As such, the microcatheter was utilized to select the more superior left-sided jejunal branch of the SMA and a repeat selective arteriogram was performed. Again, the microcatheter was advanced to select both the superior and inferior divisions of this left-sided jejunal artery and sub selective arteriograms were performed. Finally, the microcatheter was utilized to select the more inferior left-sided jejunal branch and a completion arteriogram was performed. Images were reviewed and the procedure was terminated. All wires,  catheters and sheaths were removed from the patient. Hemostasis was achieved at the right groin access site with deployment of an ExoSeal closure device. The patient tolerated the procedure well without immediate post  procedural complication. FINDINGS: Repeat exhaustive superior mesenteric arteriogram with selective arteriograms of 2 left-sided jejunal branch of the SMA as well as sub selective injections of its distal arcade was negative for discrete area of active extravasation or vessel irregularity. Following cholecystostomy tube placement, cholecystostomy tube is appropriately positioned with end coiled and locked within the gallbladder lumen. IMPRESSION: 1. Despite exhaustive superior mesenteric arteriogram with selective arteriograms of two left-sided jejunal branches of the SMA as well as its distal arcade, no discrete area of active extravasation or vessel irregularity is identified. No additional embolization performed. 2. Successful image guided placement of a cholecystostomy tube yielding a return of purulent, foul-smelling bile. A sample of aspirated bile was capped and sent to the laboratory analysis. PLAN: - The patient is to remain flat for 4 hours with right leg straight. - If there remains clinical concern for residual or recurrent GI bleeding repeat endoscopy could be performed as indicated. Ultimately, patient may benefit from formal surgical consultation. Electronically Signed   By: Sandi Mariscal M.D.   On: 09/08/2021 14:50   DG CHEST PORT 1 VIEW  Result Date: 09/08/2021 CLINICAL DATA:  Central line placement. EXAM: PORTABLE CHEST 1 VIEW COMPARISON:  September 07, 2021. FINDINGS: Stable cardiomediastinal silhouette. Endotracheal and nasogastric tubes are in grossly good position. Interval placement of right internal jugular catheter with distal tip in expected position of cavoatrial junction. Mild bibasilar subsegmental atelectasis is noted with small pleural effusions. IMPRESSION: Interval  placement of right internal jugular catheter with distal tip in expected position of cavoatrial junction. No pneumothorax is noted. Hypoinflation of the lungs with mild bibasilar subsegmental atelectasis and small pleural effusions. Electronically Signed   By: Marijo Conception M.D.   On: 09/08/2021 14:34   CT ANGIO GI BLEED  Result Date: 09/07/2021 CLINICAL DATA:  Embolization for GI bleeding. Patient now with rebleeding. EXAM: CTA ABDOMEN AND PELVIS WITHOUT AND WITH CONTRAST TECHNIQUE: Multidetector CT imaging of the abdomen and pelvis was performed using the standard protocol during bolus administration of intravenous contrast. Multiplanar reconstructed images and MIPs were obtained and reviewed to evaluate the vascular anatomy. RADIATION DOSE REDUCTION: This exam was performed according to the departmental dose-optimization program which includes automated exposure control, adjustment of the mA and/or kV according to patient size and/or use of iterative reconstruction technique. CONTRAST:  50m OMNIPAQUE IOHEXOL 300 MG/ML  SOLN COMPARISON:  GI bleeding study, 09/06/2021. FINDINGS: VASCULAR Aorta: Mild atherosclerosis. No aneurysm or dissection. No significant stenosis. Celiac: Patent without evidence of aneurysm, dissection, vasculitis or significant stenosis. SMA: Patent without evidence of aneurysm, dissection, vasculitis or significant stenosis. Renals: Both renal arteries are patent without evidence of aneurysm, dissection, vasculitis, fibromuscular dysplasia or significant stenosis. IMA: Small with patent. Inflow: Patent without evidence of aneurysm, dissection, vasculitis or significant stenosis. Proximal Outflow: Bilateral common femoral and visualized portions of the superficial and profunda femoral arteries are patent without evidence of aneurysm, dissection, vasculitis or significant stenosis. Veins: Unremarkable. Review of the MIP images confirms the above findings. NON-VASCULAR Lower chest:  Dependent lung base atelectasis, new since the previous day's study. Previously noted small nodules are obscured. Hepatobiliary: Liver normal size. Decreased liver attenuation consistent with fatty infiltration. No mass or focal lesion. Gallbladder is distended. There are dependent gallstones. Air is seen in the non dependent gallbladder and along the anterior superior wall. There is hazy inflammatory change adjacent to the gallbladder and in the right upper quadrant, new compared to the previous day's CT. No bile duct dilation. Pancreas: Unremarkable.  No pancreatic ductal dilatation or surrounding inflammatory changes. Spleen: Normal in size without focal abnormality. Adrenals/Urinary Tract: No adrenal masses. Kidneys normal in overall size, orientation and position with symmetric enhancement. Small intrarenal stones. Residual renal contrast. Right kidney midpole cyst, 1.5 cm. No follow-up recommended. No hydronephrosis. Normal ureters. Bladder decompressed with a Foley catheter. Stomach/Bowel: Previously seen arterial enhancement extravasation in the left mid abdomen, arising from a diverticulum along the jejunum, is no longer appreciated. The diverticulum is again noted as are several additional small bowel diverticula. There are no current areas of abnormal bowel enhancement or extravasation of contrast into the lumen to suggest an active GI bleeding source. Linear metal densities project adjacent to a loop of jejunum consistent with embolization coils. This is seen on axial image 50, series 19, coronal image 55, series 14. Nasogastric tube curls within the stomach. No stomach wall thickening or inflammation. Small bowel and colon are normal in caliber. There is no bowel wall thickening. No adjacent mesenteric inflammation. Enteric capsule now lies within the right colon. Lymphatic: No enlarged lymph nodes. Reproductive: Unremarkable. Other: Trace ascites adjacent to the liver. Musculoskeletal: No fracture or  acute finding.  No bone lesion. IMPRESSION: VASCULAR 1. Mild aortic atherosclerosis.  No aneurysm or dissection. 2. Aortic branch vessels are widely patent. NON-VASCULAR 1. 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 2. No current evidence of an active GI bleeding source. 3. Abnormal gallbladder including non dependent air, gallbladder wall air and pericholecystic and right upper quadrant inflammation developing since the previous day's CT. Findings consistent with acute cholecystitis. 4. Trace ascites, new since the previous day's CT. 5. New dependent lung base atelectasis. Electronically Signed   By: Lajean Manes M.D.   On: 09/07/2021 16:38   DG Abd Portable 1V  Result Date: 09/07/2021 CLINICAL DATA:  Nasogastric tube placement. EXAM: PORTABLE ABDOMEN - 1 VIEW COMPARISON:  None Available. FINDINGS: Distal tip of nasogastric tube is seen within the stomach. IMPRESSION: Distal tip of nasogastric tube seen within the stomach. Electronically Signed   By: Marijo Conception M.D.   On: 09/07/2021 14:41   DG Chest Port 1 View  Result Date: 09/07/2021 CLINICAL DATA:  Intubation. EXAM: PORTABLE CHEST 1 VIEW COMPARISON:  Same day. FINDINGS: Endotracheal tube is in grossly good position. Distal tip of nasogastric tube is seen in the stomach. Right-sided PICC line is noted with distal tip in expected position of cavoatrial junction. Elevated right hemidiaphragm is noted with mild right basilar atelectasis. IMPRESSION: Endotracheal and nasogastric tubes are in grossly good position. Electronically Signed   By: Marijo Conception M.D.   On: 09/07/2021 14:40   DG Chest Port 1 View  Result Date: 09/07/2021 CLINICAL DATA:  61 year old male with bleeding small bowel diverticula. Hypoxia. EXAM: PORTABLE CHEST 1 VIEW COMPARISON:  Portable chest 09/06/2021 and earlier. FINDINGS: Portable AP semi upright view at 0954 hours. Very low lung volumes now. There  is a right side central venous catheter. Stable cardiac size and mediastinal contours. Allowing for portable technique the lungs are clear. Visualized tracheal air column is within normal limits. Visible bowel-gas pattern within normal limits. No acute osseous abnormality identified. IMPRESSION: Very Low lung volumes.  No acute cardiopulmonary abnormality. Electronically Signed   By: Genevie Ann M.D.   On: 09/07/2021 10:03   [place] EKG SITE RITE  Result Date: 09/06/2021 If Site Rite image not attached, placement could not be confirmed due to  current cardiac rhythm.  DG CHEST PORT 1 VIEW  Result Date: 09/06/2021 CLINICAL DATA:  Shortness of breath. EXAM: PORTABLE CHEST 1 VIEW COMPARISON:  October 10, 2004. FINDINGS: Stable cardiomediastinal silhouette. Lungs are clear. Mildly elevated right hemidiaphragm is noted. Bony thorax is unremarkable. IMPRESSION: No active disease. Electronically Signed   By: Marijo Conception M.D.   On: 09/06/2021 16:37   CT ANGIO GI BLEED  Addendum Date: 09/06/2021   ADDENDUM REPORT: 09/06/2021 15:13 ADDENDUM: Critical Value/emergent results were called by telephone at the time of interpretation on 09/06/2021 at 3:12 pm to provider [facility] , who verbally acknowledged these results. Electronically Signed   By: Misty Stanley M.D.   On: 09/06/2021 15:13   Result Date: 09/06/2021 CLINICAL DATA:  Rectal bleeding. EXAM: CTA ABDOMEN AND PELVIS WITHOUT AND WITH CONTRAST TECHNIQUE: Multidetector CT imaging of the abdomen and pelvis was performed using the standard protocol during bolus administration of intravenous contrast. Multiplanar reconstructed images and MIPs were obtained and reviewed to evaluate the vascular anatomy. RADIATION DOSE REDUCTION: This exam was performed according to the departmental dose-optimization program which includes automated exposure control, adjustment of the mA and/or kV according to patient size and/or use of iterative reconstruction technique.  CONTRAST:  130m OMNIPAQUE IOHEXOL 350 MG/ML SOLN COMPARISON:  09/05/2021 FINDINGS: VASCULAR Aorta: Normal caliber aorta without aneurysm, dissection, vasculitis or significant stenosis. Mild atherosclerotic calcification noted. Celiac: Patent without evidence of aneurysm, dissection, vasculitis or significant stenosis. Mass-effect on the celiac origin likely secondary to compression by the median arcuate ligament. SMA: Patent without evidence of aneurysm, dissection, vasculitis or significant stenosis. Replaced right hepatic artery. Renals: Both renal arteries are patent without evidence of aneurysm, dissection, vasculitis, fibromuscular dysplasia or significant stenosis. Accessory left renal artery noted. 2 accessory renal arteries noted right kidney, 1 of which arises from the right common iliac artery. IMA: Patent without evidence of aneurysm, dissection, vasculitis or significant stenosis. Inflow: Patent without evidence of aneurysm, dissection, vasculitis or significant stenosis. Proximal Outflow: Bilateral common femoral and visualized portions of the superficial and profunda femoral arteries are patent without evidence of aneurysm, dissection, vasculitis or significant stenosis. Veins: No obvious venous abnormality within the limitations of this arterial phase study. Review of the MIP images confirms the above findings. NON-VASCULAR Lower chest: Tiny pulmonary nodules again identified measuring up to 4 mm. Hepatobiliary: No suspicious focal abnormality within the liver parenchyma. Small calcified gallstones evident. No intrahepatic or extrahepatic biliary dilation. Pancreas: No focal mass lesion. No dilatation of the main duct. No intraparenchymal cyst. No peripancreatic edema. Spleen: No splenomegaly. No focal mass lesion. Adrenals/Urinary Tract: No adrenal nodule or mass. Tiny nonobstructing stones are noted in both kidneys. Small cyst noted lower interpolar right kidney. Left kidney unremarkable. No  evidence for hydroureter. The urinary bladder appears normal for the degree of distention. Stomach/Bowel: Stomach is unremarkable. No gastric wall thickening. No evidence of outlet obstruction. Duodenum is normally positioned as is the ligament of Treitz. Duodenal diverticuli noted. No small bowel wall thickening. No small bowel dilatation. Active arterial phase bleeding is identified in in a small bowel loop of the left abdomen. Bleeding appears to arise in a diverticulum (axial image 96 series 9) with layering of extravasated contrast dependently within the bowel lumen. Apparent diverticulum also well demonstrated on coronal image 64/11 with additional small bowel diverticuli seen in the same region (also coronal 64/11). There is a capsule endoscope in the proximal duodenum on precontrast imaging but has migrated into the proximal jejunum  by postcontrast imaging. The terminal ileum is normal. The appendix is normal. No gross colonic mass. No colonic wall thickening. Lymphatic: There is no gastrohepatic or hepatoduodenal ligament lymphadenopathy. No retroperitoneal or mesenteric lymphadenopathy. No pelvic sidewall lymphadenopathy. Reproductive: The prostate gland and seminal vesicles are unremarkable. Other: No intraperitoneal free fluid. Musculoskeletal: No worrisome lytic or sclerotic osseous abnormality. Ventral mesh evident. There is a paraumbilical ventral hernia containing only fat. IMPRESSION: VASCULAR 1. Active arterial phase extravasation identified in a small bowel loop of the left upper quadrant, likely jejunal. The source of the bleeding appears to be a diverticulum and multiple additional diverticuli are seen in the duodenum and proximal small bowel. 2. Abdominal aortic atherosclerosis without aneurysm. NON-VASCULAR 1. Capsule endoscope identified in the duodenum on precontrast imaging migrates into proximal small bowel by postcontrast series. 2. Cholelithiasis. 3. Bilateral nonobstructing  nephrolithiasis. 4. Duodenal and jejunal diverticuli. 5. Tiny pulmonary nodules again noted in both lung bases measuring up to 4 mm. No follow-up needed if patient is low-risk (and has no known or suspected primary neoplasm). Non-contrast chest CT can be considered in 12 months if patient is high-risk. This recommendation follows the consensus statement: Guidelines for Management of Incidental Pulmonary Nodules Detected on CT Images: From the Fleischner Society 2017; Radiology 2017; 559:741-638. 6. Paraumbilical anterior abdominal wall hernia containing only fat in this patient status post ventral mesh placement. Hernia may be along the inferior margin of the existing mash. Electronically Signed: By: Misty Stanley M.D. On: 09/06/2021 15:06    Labs:  CBC: Recent Labs    09/08/21 1808 09/08/21 1907 09/08/21 2326 09/09/21 0426 09/09/21 1001  WBC 28.7*  --  26.2* 25.4* 24.6*  HGB 7.1* 5.8* 8.1* 7.5* 7.1*  HCT 19.8* 17.0* 22.5* 21.4* 19.8*  PLT 122*  --  107* 107* 101*    COAGS: Recent Labs    04/28/21 1504 09/05/21 0307 09/06/21 2240 09/07/21 2056  INR 1.0 1.2 1.6* 2.0*  APTT 26  --   --  32    BMP: Recent Labs    09/08/21 0344 09/08/21 0355 09/08/21 0809 09/08/21 1126 09/08/21 1808 09/08/21 1907 09/09/21 0426  NA 137   < > 136 135 137 136 139  K 3.6   < > 4.3 4.8 5.0 5.0 4.7  CL 109  --  109  --  110  --  106  CO2 19*  --  20*  --  19*  --  22  GLUCOSE 172*  --  176*  --  228*  --  208*  BUN 44*  --  46*  --  48*  --  38*  CALCIUM 6.4*  --  6.5*  --  6.0*  --  6.6*  CREATININE 3.61*  --  3.57*  --  2.98*  --  2.09*  GFRNONAA 19*  --  19*  --  24*  --  36*   < > = values in this interval not displayed.    LIVER FUNCTION TESTS: Recent Labs    04/29/21 0502 09/04/21 1238 09/06/21 2240 09/07/21 2054 09/08/21 1808 09/09/21 0426  BILITOT 0.5 0.8 0.4 0.5  --   --   AST _0 33  --   --   ALT _1 32  --   --   ALKPHOS 34* 35* 21* 124  --   --   PROT 6.8  6.9 3.7* 3.2*  --   --   ALBUMIN 3.6 3.7 1.8* <1.5* <1.5* <1.5*  Assessment and Plan:  Percutaneous chole drain placed 09/07/21 GI embolization 09/06/21 Will continue to follow  Electronically Signed: Lavonia Drafts, PA-C 09/09/2021, 1:58 PM   I spent a total of 15 Minutes at the the patient's bedside AND on the patient's hospital floor or unit, greater than 50% of which was counseling/coordinating care for perc chole drain; gi embolization

## 2021-09-09 NOTE — Progress Notes (Signed)
eLink Physician-Brief Progress Note Patient Name: Troy Randall DOB: 11/04/1960 MRN: [id number]   Date of Service  09/09/2021  HPI/Events of Note  Patient just had a large maroon colored bowel movement, he just finished receiving a unit of PRBC, no change in blood pressure or heart rate.  eICU Interventions  Will order a stat H & H, if consistent with additional significant hemorrhage, as opposed to intestinal transit of prior bleeding, will transfuse 2 units of PRBC stat and order a CTA of the abdomen / pelvis.        [name] 09/09/2021, 11:46 PM

## 2021-09-09 NOTE — Progress Notes (Signed)
Initial Nutrition Assessment  DOCUMENTATION CODES:   Obesity unspecified  INTERVENTION:   Initiate tube feeding via OG tube: Vital 1.5 at 35 ml/hr and increase by 10 ml every 8 hours to goal rate of 55 ml/h (1320 ml per day) Prosource TF20 60 ml five  Provides 2380 kcal, 189 gm protein, 1003 ml free water daily   NUTRITION DIAGNOSIS:   Increased nutrient needs related to acute illness as evidenced by estimated needs.  GOAL:   Patient will meet greater than or equal to 90% of their needs  MONITOR:   TF tolerance  REASON FOR ASSESSMENT:   Consult Enteral/tube feeding initiation and management  ASSESSMENT:   Pt with PMH of HTN, HLD, DM, CVA on ASA/plavix admitted 8/31 from [facility] due to gi bleed.   Pt discussed during ICU rounds and with RN and MD.  Pt on vent support.  Spoke with son and brother at bedside. Per family pt's usual weight was 307 lb. He has been eating the same until day of admission but has not felt as good since stroke 2 months ago.  Nephrology following for AKI, now on CRRT with fluid overload. Currently 37 lb higher than usual weight    8/31 admit 9/2 s/p colonoscopy: no active bleed but large clots throughout entire colon; s/p EGD: small hital hernia, erosive gastropathy, non-bleeding duodenal diverticulum; s/p small bowel enteroscopy: jejunal diverticular bleeding s/p coil embolization  9/3 intubated; new cholecystitis s/p IR for cholecystectomy tube  9/4 CRRT started   Medications reviewed and include: solu-cortef, SSI, 20 units semglee daily, protoinx  Fentanyl  Levophed @ 16 mcg  Vasopressin @ .03 units  MAP >70   Labs reviewed:  CBG's: 163-192  Last A1C: 6.2 (04/06/2021)   RUQ drain: 200 ml  UF: 1819 ml  I&O: +20 L   OG tube: tip within the stomach per xray    NUTRITION - FOCUSED PHYSICAL EXAM:  Flowsheet Row Most Recent Value  Orbital Region No depletion  Upper Arm Region No depletion  Thoracic and Lumbar Region No depletion   Buccal Region No depletion  Temple Region No depletion  Clavicle Bone Region No depletion  Clavicle and Acromion Bone Region No depletion  Scapular Bone Region No depletion  Dorsal Hand No depletion  Patellar Region No depletion  Anterior Thigh Region No depletion  Posterior Calf Region No depletion  Edema (RD Assessment) Severe  Hair Reviewed  Eyes Unable to assess  Mouth Unable to assess  Skin Reviewed  Nails Reviewed       Diet Order:   Diet Order             Diet NPO time specified  Diet effective now                   EDUCATION NEEDS:   No education needs have been identified at this time  Skin:  Skin Assessment: Reviewed RN Assessment  Last BM:  9/4 x 2 maroon  Height:   Ht Readings from Last 1 Encounters:  09/06/21 6' (1.829 m)    Weight:   Wt Readings from Last 1 Encounters:  09/09/21 (!) 156.1 kg    Ideal Body Weight:    80.9 kg   BMI:  Body mass index is 46.67 kg/m.  Estimated Nutritional Needs:   Kcal:  2200-2500  Protein:  160-200 grams  Fluid:  >2 L/day  Cammy Copa., RD, LDN, CNSC See AMi[facility] for contact information

## 2021-09-10 ENCOUNTER — Encounter (HOSPITAL_COMMUNITY)
Admission: EM | Disposition: A | Discharge status: Rehabilitation Facility | Payer: Self-pay | Source: Home / Self Care | Attending: Family Medicine

## 2021-09-10 ENCOUNTER — Inpatient Hospital Stay (HOSPITAL_COMMUNITY): Payer: Medicare HMO

## 2021-09-10 ENCOUNTER — Encounter (HOSPITAL_COMMUNITY): Payer: Self-pay | Admitting: Internal Medicine

## 2021-09-10 DIAGNOSIS — N170 Acute kidney failure with tubular necrosis: Secondary | ICD-10-CM | POA: Diagnosis not present

## 2021-09-10 DIAGNOSIS — K922 Gastrointestinal hemorrhage, unspecified: Secondary | ICD-10-CM | POA: Diagnosis not present

## 2021-09-10 DIAGNOSIS — I10 Essential (primary) hypertension: Secondary | ICD-10-CM | POA: Diagnosis not present

## 2021-09-10 DIAGNOSIS — K8020 Calculus of gallbladder without cholecystitis without obstruction: Secondary | ICD-10-CM | POA: Diagnosis not present

## 2021-09-10 DIAGNOSIS — D500 Iron deficiency anemia secondary to blood loss (chronic): Secondary | ICD-10-CM | POA: Diagnosis not present

## 2021-09-10 DIAGNOSIS — E1110 Type 2 diabetes mellitus with ketoacidosis without coma: Secondary | ICD-10-CM | POA: Diagnosis not present

## 2021-09-10 DIAGNOSIS — R579 Shock, unspecified: Secondary | ICD-10-CM | POA: Diagnosis not present

## 2021-09-10 DIAGNOSIS — N200 Calculus of kidney: Secondary | ICD-10-CM | POA: Diagnosis not present

## 2021-09-10 DIAGNOSIS — D62 Acute posthemorrhagic anemia: Secondary | ICD-10-CM | POA: Diagnosis not present

## 2021-09-10 DIAGNOSIS — J9601 Acute respiratory failure with hypoxia: Secondary | ICD-10-CM | POA: Diagnosis not present

## 2021-09-10 DIAGNOSIS — K289 Gastrojejunal ulcer, unspecified as acute or chronic, without hemorrhage or perforation: Secondary | ICD-10-CM | POA: Diagnosis not present

## 2021-09-10 DIAGNOSIS — K625 Hemorrhage of anus and rectum: Secondary | ICD-10-CM | POA: Diagnosis not present

## 2021-09-10 DIAGNOSIS — Z4682 Encounter for fitting and adjustment of non-vascular catheter: Secondary | ICD-10-CM | POA: Diagnosis not present

## 2021-09-10 HISTORY — PX: ENTEROSCOPY: SHX5533

## 2021-09-10 HISTORY — PX: BIOPSY: SHX5522

## 2021-09-10 LAB — TYPE AND SCREEN
ABO/RH(D): A NEG
Antibody Screen: NEGATIVE
Unit division: 0
Unit division: 0
Unit division: 0
Unit division: 0
Unit division: 0
Unit division: 0
Unit division: 0
Unit division: 0
Unit division: 0
Unit division: 0
Unit division: 0

## 2021-09-10 LAB — CBC
HCT: 20.4 % — ABNORMAL LOW (ref 39.0–52.0)
HCT: 20.3 % — ABNORMAL LOW (ref 39.0–52.0)
HCT: 29.5 % — ABNORMAL LOW (ref 39.0–52.0)
Hemoglobin: 7.1 g/dL — ABNORMAL LOW (ref 13.0–17.0)
Hemoglobin: 6.8 g/dL — CL (ref 13.0–17.0)
Hemoglobin: 10.4 g/dL — ABNORMAL LOW (ref 13.0–17.0)
MCH: 28.4 pg (ref 26.0–34.0)
MCH: 27.9 pg (ref 26.0–34.0)
MCH: 29.1 pg (ref 26.0–34.0)
MCHC: 34.8 g/dL (ref 30.0–36.0)
MCHC: 33.5 g/dL (ref 30.0–36.0)
MCHC: 35.3 g/dL (ref 30.0–36.0)
MCV: 81.6 fL (ref 80.0–100.0)
MCV: 83.2 fL (ref 80.0–100.0)
MCV: 82.4 fL (ref 80.0–100.0)
Platelets: 66 10*3/uL — ABNORMAL LOW (ref 150–400)
Platelets: 67 10*3/uL — ABNORMAL LOW (ref 150–400)
Platelets: 47 10*3/uL — ABNORMAL LOW (ref 150–400)
RBC: 2.5 MIL/uL — ABNORMAL LOW (ref 4.22–5.81)
RBC: 2.44 MIL/uL — ABNORMAL LOW (ref 4.22–5.81)
RBC: 3.58 MIL/uL — ABNORMAL LOW (ref 4.22–5.81)
RDW: 18.3 % — ABNORMAL HIGH (ref 11.5–15.5)
RDW: 18.5 % — ABNORMAL HIGH (ref 11.5–15.5)
RDW: 16.6 % — ABNORMAL HIGH (ref 11.5–15.5)
WBC: 18.6 10*3/uL — ABNORMAL HIGH (ref 4.0–10.5)
WBC: 16.7 10*3/uL — ABNORMAL HIGH (ref 4.0–10.5)
WBC: 17.2 10*3/uL — ABNORMAL HIGH (ref 4.0–10.5)
nRBC: 1.7 % — ABNORMAL HIGH (ref 0.0–0.2)
nRBC: 1.6 % — ABNORMAL HIGH (ref 0.0–0.2)
nRBC: 1.6 % — ABNORMAL HIGH (ref 0.0–0.2)

## 2021-09-10 LAB — BPAM RBC
Blood Product Expiration Date: 202309052359
Blood Product Expiration Date: 202309202359
Blood Product Expiration Date: 202309242359
Blood Product Expiration Date: 202309242359
Blood Product Expiration Date: 202309252359
Blood Product Expiration Date: 202309252359
Blood Product Expiration Date: 202309262359
Blood Product Expiration Date: 202309262359
Blood Product Expiration Date: 202309272359
Blood Product Expiration Date: 202309272359
Blood Product Expiration Date: 202309282359
ISSUE DATE / TIME: 202309030028
ISSUE DATE / TIME: 202309030028
ISSUE DATE / TIME: 202309031319
ISSUE DATE / TIME: 202309031319
ISSUE DATE / TIME: 202309031319
ISSUE DATE / TIME: 202309032050
ISSUE DATE / TIME: 202309041247
ISSUE DATE / TIME: 202309041503
ISSUE DATE / TIME: 202309041951
ISSUE DATE / TIME: 202309051831
ISSUE DATE / TIME: 202309052156
Unit Type and Rh: 600
Unit Type and Rh: 600
Unit Type and Rh: 600
Unit Type and Rh: 600
Unit Type and Rh: 600
Unit Type and Rh: 600
Unit Type and Rh: 600
Unit Type and Rh: 600
Unit Type and Rh: 600
Unit Type and Rh: 600
Unit Type and Rh: 9500

## 2021-09-10 LAB — URINALYSIS, ROUTINE W REFLEX MICROSCOPIC
Bilirubin Urine: NEGATIVE
Glucose, UA: >=500 mg/dL — AB
Hgb urine dipstick: NEGATIVE
Ketones, ur: NEGATIVE mg/dL
Leukocytes,Ua: NEGATIVE
Nitrite: NEGATIVE
Protein, ur: 30 mg/dL — AB
Specific Gravity, Urine: 1.024 (ref 1.005–1.030)
pH: 5 (ref 5.0–8.0)

## 2021-09-10 LAB — CBC WITH DIFFERENTIAL/PLATELET
Abs Immature Granulocytes: 0.62 10*3/uL — ABNORMAL HIGH (ref 0.00–0.07)
Basophils Absolute: 0 10*3/uL (ref 0.0–0.1)
Basophils Relative: 0 %
Eosinophils Absolute: 0 10*3/uL (ref 0.0–0.5)
Eosinophils Relative: 0 %
HCT: 19.9 % — ABNORMAL LOW (ref 39.0–52.0)
Hemoglobin: 7 g/dL — ABNORMAL LOW (ref 13.0–17.0)
Immature Granulocytes: 4 %
Lymphocytes Relative: 3 %
Lymphs Abs: 0.4 10*3/uL — ABNORMAL LOW (ref 0.7–4.0)
MCH: 29 pg (ref 26.0–34.0)
MCHC: 35.2 g/dL (ref 30.0–36.0)
MCV: 82.6 fL (ref 80.0–100.0)
Monocytes Absolute: 0.8 10*3/uL (ref 0.1–1.0)
Monocytes Relative: 4 %
Neutro Abs: 15.1 10*3/uL — ABNORMAL HIGH (ref 1.7–7.7)
Neutrophils Relative %: 89 %
Platelets: 66 10*3/uL — ABNORMAL LOW (ref 150–400)
RBC: 2.41 MIL/uL — ABNORMAL LOW (ref 4.22–5.81)
RDW: 18.6 % — ABNORMAL HIGH (ref 11.5–15.5)
Smear Review: NORMAL
WBC: 16.9 10*3/uL — ABNORMAL HIGH (ref 4.0–10.5)
nRBC: 1.6 % — ABNORMAL HIGH (ref 0.0–0.2)

## 2021-09-10 LAB — POCT I-STAT, CHEM 8
BUN: 32 mg/dL — ABNORMAL HIGH (ref 6–20)
Calcium, Ion: 1.07 mmol/L — ABNORMAL LOW (ref 1.15–1.40)
Chloride: 105 mmol/L (ref 98–111)
Creatinine, Ser: 0.9 mg/dL (ref 0.61–1.24)
Glucose, Bld: 244 mg/dL — ABNORMAL HIGH (ref 70–99)
HCT: 31 % — ABNORMAL LOW (ref 39.0–52.0)
Hemoglobin: 10.5 g/dL — ABNORMAL LOW (ref 13.0–17.0)
Potassium: 4.2 mmol/L (ref 3.5–5.1)
Sodium: 141 mmol/L (ref 135–145)
TCO2: 28 mmol/L (ref 22–32)

## 2021-09-10 LAB — GLUCOSE, CAPILLARY
Glucose-Capillary: 294 mg/dL — ABNORMAL HIGH (ref 70–99)
Glucose-Capillary: 303 mg/dL — ABNORMAL HIGH (ref 70–99)
Glucose-Capillary: 135 mg/dL — ABNORMAL HIGH (ref 70–99)
Glucose-Capillary: 216 mg/dL — ABNORMAL HIGH (ref 70–99)
Glucose-Capillary: 146 mg/dL — ABNORMAL HIGH (ref 70–99)
Glucose-Capillary: 130 mg/dL — ABNORMAL HIGH (ref 70–99)

## 2021-09-10 LAB — RENAL FUNCTION PANEL
Albumin: <1.5 g/dL — ABNORMAL LOW (ref 3.5–5.0)
Albumin: <1.5 g/dL — ABNORMAL LOW (ref 3.5–5.0)
Anion gap: 5 (ref 5–15)
Anion gap: 9 (ref 5–15)
BUN: 36 mg/dL — ABNORMAL HIGH (ref 6–20)
BUN: 34 mg/dL — ABNORMAL HIGH (ref 6–20)
CO2: 25 mmol/L (ref 22–32)
CO2: 26 mmol/L (ref 22–32)
Calcium: 6.7 mg/dL — ABNORMAL LOW (ref 8.9–10.3)
Calcium: 7.1 mg/dL — ABNORMAL LOW (ref 8.9–10.3)
Chloride: 108 mmol/L (ref 98–111)
Chloride: 105 mmol/L (ref 98–111)
Creatinine, Ser: 1.27 mg/dL — ABNORMAL HIGH (ref 0.61–1.24)
Creatinine, Ser: 1.02 mg/dL (ref 0.61–1.24)
GFR, Estimated: >60 mL/min (ref >60)
GFR, Estimated: >60 mL/min (ref >60)
Glucose, Bld: 295 mg/dL — ABNORMAL HIGH (ref 70–99)
Glucose, Bld: 263 mg/dL — ABNORMAL HIGH (ref 70–99)
Phosphorus: 2.3 mg/dL — ABNORMAL LOW (ref 2.5–4.6)
Phosphorus: 2.5 mg/dL (ref 2.5–4.6)
Potassium: 4.2 mmol/L (ref 3.5–5.1)
Potassium: 4.1 mmol/L (ref 3.5–5.1)
Sodium: 138 mmol/L (ref 135–145)
Sodium: 140 mmol/L (ref 135–145)

## 2021-09-10 LAB — TECHNOLOGIST SMEAR REVIEW: Plt Morphology: NORMAL

## 2021-09-10 LAB — HEMOGLOBIN AND HEMATOCRIT, BLOOD
HCT: 15.9 % — ABNORMAL LOW (ref 39.0–52.0)
HCT: 21.3 % — ABNORMAL LOW (ref 39.0–52.0)
HCT: 28.9 % — ABNORMAL LOW (ref 39.0–52.0)
Hemoglobin: 10.1 g/dL — ABNORMAL LOW (ref 13.0–17.0)
Hemoglobin: 5.3 g/dL — CL (ref 13.0–17.0)
Hemoglobin: 7.3 g/dL — ABNORMAL LOW (ref 13.0–17.0)

## 2021-09-10 LAB — LACTIC ACID, PLASMA
Lactic Acid, Venous: 1.1 mmol/L (ref 0.5–1.9)
Lactic Acid, Venous: 1.5 mmol/L (ref 0.5–1.9)

## 2021-09-10 LAB — PREPARE RBC (CROSSMATCH)

## 2021-09-10 LAB — MAGNESIUM
Magnesium: 2.4 mg/dL (ref 1.7–2.4)
Magnesium: 2.5 mg/dL — ABNORMAL HIGH (ref 1.7–2.4)

## 2021-09-10 SURGERY — BIOPSY

## 2021-09-10 MED ORDER — GLUCAGON HCL RDNA (DIAGNOSTIC) 1 MG IJ SOLR
INTRAMUSCULAR | Status: AC
  Filled 2021-09-10: qty 1

## 2021-09-10 MED ORDER — INSULIN ASPART 100 UNIT/ML IJ SOLN
5.0000 [IU] | INTRAMUSCULAR | Status: DC
  Administered 2021-09-10: 5 [IU] via SUBCUTANEOUS

## 2021-09-10 MED ORDER — MIDAZOLAM HCL (PF) 10 MG/2ML IJ SOLN
INTRAMUSCULAR | Status: DC | PRN
  Administered 2021-09-10: 2 mg via INTRAVENOUS

## 2021-09-10 MED ORDER — GLUCAGON HCL (RDNA) 1 MG IJ SOLR
INTRAMUSCULAR | Status: DC | PRN
  Administered 2021-09-10 (×2): .5 mL via INTRAVENOUS

## 2021-09-10 MED ORDER — INSULIN GLARGINE-YFGN 100 UNIT/ML ~~LOC~~ SOLN
20.0000 [IU] | Freq: Every day | SUBCUTANEOUS | Status: DC
  Administered 2021-09-10: 20 [IU] via SUBCUTANEOUS
  Filled 2021-09-10 (×2): qty 0.2

## 2021-09-10 MED ORDER — SODIUM CHLORIDE 0.9% IV SOLUTION: Freq: Once | INTRAVENOUS | Status: AC

## 2021-09-10 MED ORDER — HEPARIN SODIUM (PORCINE) 1000 UNIT/ML IJ SOLN
INTRAMUSCULAR | Status: DC
Start: 2021-09-10 — End: 2021-09-10
  Filled 2021-09-10: qty 4

## 2021-09-10 MED ORDER — SODIUM CHLORIDE 0.9% IV SOLUTION
Freq: Once | INTRAVENOUS | Status: AC
Start: 2021-09-10 — End: 2021-09-10

## 2021-09-10 MED ORDER — INSULIN ASPART 100 UNIT/ML IJ SOLN
0.0000 [IU] | INTRAMUSCULAR | Status: DC
  Administered 2021-09-10: 3 [IU] via SUBCUTANEOUS
  Administered 2021-09-10: 7 [IU] via SUBCUTANEOUS
  Administered 2021-09-10: 15 [IU] via SUBCUTANEOUS
  Administered 2021-09-10 – 2021-09-11 (×4): 3 [IU] via SUBCUTANEOUS
  Administered 2021-09-11: 4 [IU] via SUBCUTANEOUS
  Administered 2021-09-11 (×3): 3 [IU] via SUBCUTANEOUS

## 2021-09-10 MED ORDER — HEPARIN SODIUM (PORCINE) 1000 UNIT/ML IJ SOLN: 1000.0000 [IU] | INTRAMUSCULAR | Status: DC

## 2021-09-10 MED ORDER — SODIUM CHLORIDE 0.9% IV SOLUTION: Freq: Once | INTRAVENOUS | Status: DC

## 2021-09-10 MED ORDER — CALCIUM GLUCONATE-NACL 1-0.675 GM/50ML-% IV SOLN
1.0000 g | Freq: Once | INTRAVENOUS | Status: AC
  Administered 2021-09-10: 1000 mg via INTRAVENOUS
  Filled 2021-09-10: qty 50

## 2021-09-10 MED ORDER — IOHEXOL 350 MG/ML SOLN
100.0000 mL | Freq: Once | INTRAVENOUS | Status: AC | PRN
Start: 2021-09-10 — End: 2021-09-10
  Administered 2021-09-10: 100 mL via INTRAVENOUS

## 2021-09-10 MED ORDER — PANTOPRAZOLE SODIUM 40 MG IV SOLR
40.0000 mg | INTRAVENOUS | Status: DC
  Administered 2021-09-10 – 2021-09-15 (×6): 40 mg via INTRAVENOUS
  Filled 2021-09-10 (×6): qty 10

## 2021-09-10 MED ORDER — HEPARIN SODIUM (PORCINE) 1000 UNIT/ML DIALYSIS
1000.0000 [IU] | INTRAMUSCULAR | Status: DC | PRN
  Administered 2021-09-10 – 2021-09-12 (×2): 2400 [IU] via INTRAVENOUS_CENTRAL
  Filled 2021-09-10 (×3): qty 6
  Filled 2021-09-10: qty 3

## 2021-09-10 MED ORDER — SODIUM PHOSPHATES 45 MMOLE/15ML IV SOLN
15.0000 mmol | Freq: Once | INTRAVENOUS | Status: AC
  Administered 2021-09-10: 15 mmol via INTRAVENOUS
  Filled 2021-09-10: qty 5

## 2021-09-10 MED ORDER — MIDAZOLAM HCL 2 MG/2ML IJ SOLN
INTRAMUSCULAR | Status: AC
  Filled 2021-09-10: qty 2

## 2021-09-10 NOTE — Progress Notes (Signed)
eLink Physician-Brief Progress Note Patient Name: Troy Randall DOB: 25-Jun-1960 MRN: [id number]   Date of Service  09/10/2021  HPI/Events of Note  Hemoglobin 6.8 gm /dl. Patient had maroon stools earlier.  eICU Interventions  One  unit PRBC ordered transfused.        Troy Randall 09/10/2021, 6:23 AM

## 2021-09-10 NOTE — Progress Notes (Signed)
[facility] Progress Note  Name: Troy Randall MRN: [id number] DOB: [birth year]  Chief Complaint:  Dark stools on presentation  Subjective:  Seen and examined on CRRT; procedure supervised.  He had 3.1 kg UF over 9/5 with CRRT.  He had 645 mL UOP over 9/5.  Spoke with his wife at bedside and bedside RN.  He is on levo at 5 mcg/min, vaso at 0.03.  vent is down to 40% FIO2 and PEEP 5.  Team is planning CT today.  He's getting blood.   Review of systems:  Unable to obtain given intubated and sedated ------------- Background on referral:  Troy Randall is a 61 y.o. male with a history of hypertension, diabetes, nephrolithiasis, arthritis, and prior CVA who presented to the hospital with dark stools.  He had also complained of dizziness.  He had CTA which was without active bleed and had a colonoscopy without active bleed but clots noted throughout the entire colon per charting. Later underwent EGD with erosive gastropathy but no stigmata of bleeding.  Then had small bowel enteroscopy and later repeat CTA which was concerning for a bleed.  [facility] was [facility] and IR was consulted.  He underwent coil embolization. Per pulm he had hemorrhagic shock then developed septic shock.  Note he was also found to have cholecystitis and is s/p placement of a cholecystostomy tube.  He is on three pressors - levo, phenylephrine and vasopressin.  He has had respiratory failure and remains intubated. He has had worsening AKI and nephrology is consulted for assistance with management.  He had 400 mL uop over 9/3.    I spoke with his wife about the risks/benefits/indications for renal replacement therapy.  Primary team is at bedside placing a dialysis catheter.  The patient is on max levo at 40 mcg/min and phenylephrine at 400 mcg/min as well as bicarb gtt.  Team is hopeful that improving his acidosis may reduce his pressor requirement.     Intake/Output Summary (Last 24 hours)  at 09/10/2021 0755 Last data filed at 09/10/2021 0700 Gross per 24 hour  Intake 3078.41 ml  Output 3858 ml  Net -779.59 ml    Vitals:  Vitals:   09/10/21 0645 09/10/21 0650 09/10/21 0700 09/10/21 0713  BP:      Pulse: 75 77 79   Resp:  18 18   Temp: (!) 97 F (36.1 C) (!) 97 F (36.1 C) (!) 97 F (36.1 C) (!) 97 F (36.1 C)  TempSrc:    Esophageal  SpO2: 100% 100% 100%   Weight:      Height:         Physical Exam:   General:  adult male critically ill   HEENT: NCAT Eyes: closed; does not track Neck: increased neck circumference; trachea midline Heart: s1S2 no rub Lungs: coarse mechanical breath sounds; on FIO2 40 and PEEP 5 Abdomen: soft/obese  Extremities: 2+ edema lower extremities  Neuro: sedation currently running Access - RIJ nontunneled catheter in place  Medications reviewed   Labs:     Latest Ref Rng & Units 09/10/2021    1:59 AM 09/09/2021    5:38 PM 09/09/2021    4:26 AM  BMP  Glucose 70 - 99 mg/dL 354  656  812   BUN 6 - 20 mg/dL 36  39  38   Creatinine 0.61 - 1.24 mg/dL 7.51  7.00  1.74   Sodium 135 - 145 mmol/L 138  139  139  Potassium 3.5 - 5.1 mmol/L 4.2  4.3  4.7   Chloride 98 - 111 mmol/L 108  110  106   CO2 22 - 32 mmol/L 25  24  22    Calcium 8.9 - 10.3 mg/dL 6.7  6.5  6.6      Assessment/Plan:   # AKI  - secondary to ischemic ATN in the setting of multifactorial shock.  He has received clinically indicated contrast.  He was started on CRRT on 9/4 PM after nontunneled catheter placement with critical care to improve acidosis - Continue CRRT   - UF goal net neg 50 to 100 ml/hr as tolerated - spoke with primary team - 4K fluids     # Shock - mixed Septic and hemorrhagic shock  - Pressors per primary team  - source control of GI bleed and infection as below - abx per primary team    # Acute hypoxic respiratory failure  - on mechanical ventilation per primary team  - optimize volume status with CRRT   # Acute GI bleed - s/p coil  embolization  - s/p multiple studies and interventions as above - on PPI - transfusions per primary team - work-up per primary team      # Acute blood loss anemia  - transfusions per primary team    # Metabolic acidosis - secondary to AKI; as above - on CRRT    # Cholecystitis  - s/p cholecystomy drain placement  - abx per primary team   # Thrombocytopenia - no heparin with CRRT  # Hypophosphatemia - replete today and check BID labs  Disposition - continue ICU monitoring  11/4, MD 09/10/2021 8:27 AM

## 2021-09-10 NOTE — Progress Notes (Signed)
NAME:  Troy Randall, MRN:  [id number], DOB:  September 05, 1960, LOS: 6 ADMISSION DATE:  09/04/2021, CONSULTATION DATE:  09/06/21 REFERRING MD:  [name], CHIEF COMPLAINT:  melena   History of Present Illness:   Troy Randall is an 61 y.o. male with prior history of HTN, HLD, DM, and CVA on ASA/ plavix who presented to [facility] on 8/31 after developing frequent 6-7 dark stools and feeling dizzy and lightheaded on standing.  No hx of NSAIDs.  FOBT positive and initial Hgb 12.2.    Patient admitted to [facility] with GI consulting.  Remained orthostatic with progressive decline in Hgb.  CTA abdomen did not show any active bleed, therefore underwent colonoscopy > no active bleed found but large clots throughout entire colon.  Then underwent EGD which showed a small hiatal hernia, erosive gastropathy with no stigmata of recent bleeding, and non-bleeding duodenal diverticulum.   Underwent then small bowel enteroscopy which noted intermittent blood pumping in the proximal jejunum but was unable to be reached despite multiple attempts, was injected and clip placed.  Repeat CTA repeated, which showed positive jejunal diverticular bleeding. Patient was transferred to [facility] for IR involvement. In IR active extravasation  at SMA territory, jejunal arcade branch, successful coil embolization.  PCCM was consulted for transfer and admission.  Pertinent  Medical History  HTN, HLD, DM, CVA on ASA/ plavix  Significant Hospital Events: Including procedures, antibiotic start and stop dates in addition to other pertinent events   8/31 presented to [facility], [name] Admit, Gi Consult 9/1 CTA> no evidence of active GI bleed, 2 U PRBC 9/2 2 U PRBC, colonoscopy > no active bleed found but large clots throughout entire colon. EGD> small hiatal hernia, erosive gastropathy with no stigmata of recent bleeding, and non-bleeding duodenal diverticulum.   Underwent then small bowel enteroscopy which noted intermittent blood pumping in the proximal jejunum  but was unable to be reached despite multiple attempts, was injected and clip placed. Repeat CTA repeated, which showed positive jejunal diverticular bleeding. IR> active extrav at [facility] territory, jejunal arcade branch, successful coil embo 9/3 Intubated. CTA> no evidence of active GI bleeding.  New finding of abnormal gallbladder with nondependent air, gallbladder wall air, pericholecystic and right upper quadrant information.  IR placed percutaneous cholecystotomy tube.  9/4 R. IJ central line placed with dialysis triple lumen catheter 9/5 CRRT initiated   Interim History / Subjective:   Vitals stable overnight and able to remove phenylephrine. Decrease in hgb and received 2U PRBC overnight.   Objective   Blood pressure 101/72, pulse 77, temperature (!) 97 F (36.1 C), resp. rate 18, height 6' (1.829 m), weight (!) 155.8 kg, SpO2 100 %. Vent Mode: PRVC FiO2 (%):  [40 %] 40 % Set Rate:  [18 bmp] 18 bmp Vt Set:  [620 mL] 620 mL PEEP:  [5 cmH20] 5 cmH20 Plateau Pressure:  [17 cmH20-19 cmH20] 19 cmH20   Intake/Output Summary (Last 24 hours) at 09/10/2021 0701 Last data filed at 09/10/2021 0700 Gross per 24 hour  Intake 3078.41 ml  Output 3718 ml  Net -639.59 ml    Filed Weights   09/08/21 0400 09/09/21 0500 09/10/21 0500  Weight: (!) 151.8 kg (!) 156.1 kg (!) 155.8 kg   Examination: Gen: Sedated, in no distress HEENT: intubated, PERRL Lungs: course breath sounds, in no respiratory distress CV:   tachycardic, regular Abd:  Obese, soft. Percutaneous cholecystostomy tube in place with scant green drainage Ext: Trace edema in lower extremities Skin: Warm and  dry; no rashes.  Neuro: Sedated, not following commands  Resolved Hospital Problem list     Assessment & Plan:   Hemorrhagic shock ABLA secondary to lower GI bleed, likely diverticular Acute blood loss anemia, secondary to above - 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 Continues to have maroon colored  stools, he is postop day 4 from his coil embolization and received 2 units PRBC in last 24 hours.  Concerned that he is continuing to have bleed, fortunately has not required  -We will talk with interventional radiology and consult general surgery about potential imaging/procedures -Change PPI to IV and discontinue tube feeds -Trend HGB, q6h -Transfuse PRBC if HBG less than 7 -Hold constipation meds  Mixed Shock - Septic and hemorrhagic In setting of cholecystitis and lower GI bleed.  Able to discontinue Neo yesterday as well as decrease in levo and vasopressin.  Doing well with CRRT.  With improvement in his pressor requirements do not suspect he is having a brisk GI bleed. -Continues vasopressin and levophed wean as able -Continue hydrocortisone 100 mg every 12 hour.  Discontinue once vasopressors no longer required -Treatment for sepsis and hemorrhagic shock as per below -a-line in place  Septic shock Cholecystitis s/p percutaneous cholecystostomy drain placement 9/3 percutaneous cholecystostomy drain placed.  Minimal output.  Afebrile overnight. Wbc with improvement to 16. Cultures positive for gram-positive and gram-negative organisms with PMNs, awaiting final results.  Blood cultures negative thus far -Continue meropenem day 4, narrow abx per cultures -Continue to monitor drain -Trend leukocytosis and monitor for continued fevers -Will need outpatient surgery follow up  Oliguric AKI ATN 2/2 mixed shock Hypocalcemia Bilateral nonobstructing nephrolithiasis - suspect chronic BPH Baseline 4 months ago around 1.0. Cr improved 2.09> 1.27.  He is tolerating CRRT well without requiring additional pressors. Appreciate Dr. Roni Bread assistance. -195 cc urinary output.  -Appreciate nephrologies assistance -Continue CRRT -Trend electrolytes, replete as needed. Received IV calcium yesterday evening, will give additional dose -Metabolic acidosis has resolved -Hold flomax -Foley catheter in  place, strict i/o  Thrombocytopenia Platelets dropped to 67 from 124 hours.  He has had a decrease in white blood cell as well as hemoglobin level.  Low suspicion for HIT but will continue to monitor for continued drop in platelets. -Trend with CBC -Blood smear pending  Acute hypoxemic respiratory failure Likely in setting of acute illness, shock, and hypervolemia as per above. PRVC - RR 18, Tv 620, PEEP 5, peak 23, Fio2 40.  -lung protected ventilation -VAP protocol -daily SBT/SAT -will hold weaning trials while acutely ill -continue fentanyl, RASS goal of 0  History of CVA -hold asa plavix in setting of acute GI bleed -monitor neuro exam per unit protocol.  DM2 Discontinuing tube feeds this morning and will discontinue scheduled NovoLog.  Continue Semglee and SSI with glucose levels in the 300s. -Blood Glucose goal 140-180. -SSI, semglee per pharmacy  HTN HLD -Hold home antihypertensives and statin  Pulmonary nodules in bilateral bases, L 62mm Family reports patient is a never smoker. Family does endorse dipping. -no further follow up needed for a low risk patient based on fleischner criteria  Best Practice (right click and "Reselect all SmartList Selections" daily)   Diet/type: NPO, stopping tube feeds DVT prophylaxis: SCD GI prophylaxis: PPI Lines: a-line, foley, R. IJ central line Foley:  yes Code Status:  full code Last date of multidisciplinary goals of care discussion [Full scope- Discussed with wife [name] 9/2 . Family at bedside and updated on plan  for today.   Thalia Bloodgood DO  Internal Medicine Resident PGY-3 [place]  Pager: [phone]

## 2021-09-10 NOTE — Inpatient Diabetes Management (Addendum)
Inpatient Diabetes Program Recommendations  AACE/ADA: New Consensus Statement on Inpatient Glycemic Control (2015)  Target Ranges:  Prepandial:   less than 140 mg/dL      Peak postprandial:   less than 180 mg/dL (1-2 hours)      Critically ill patients:  140 - 180 mg/dL   Lab Results  Component Value Date   GLUCAP 303 (H) 09/10/2021   HGBA1C 6.2 (H) 04/29/2021    Revie  Latest Reference Range & Units 09/09/21 07:20 09/09/21 11:24 09/09/21 15:02 09/09/21 19:27 09/09/21 23:03 09/10/21 03:34 09/10/21 07:15  Glucose-Capillary 70 - 99 mg/dL 389 (H) Novolog 4 units 163 (H) Novolog 4 units 182 (H) Novolog 4 units 237 (H) Novolog 7 units 242 (H) Novolog 7 units 294 (H) Novolog 11 units 303 (H) Novolog 20 units   Inpatient Diabetes Program Recommendations:   If CBGs elevate >180, please consider ICU glycemic control order set. Noted added Novolog 4 units q 4 hrs. Tube feed coverage. Fasting CBG 303. Tube feeds currently on hold.  Thank you, Billy Fischer. Elkin Belfield, RN, MSN, CDE  Diabetes Coordinator Inpatient Glycemic Control Team Team Pager [phone] (8am-5pm) 09/10/2021 10:04 AM

## 2021-09-10 NOTE — Op Note (Addendum)
[facility] Patient Name: Troy Randall Procedure Date : 09/10/2021 MRN: [id number] Attending MD: Jeani Hawking , MD Date of Birth: 06/08/1960 CSN: [id number] Age: 61 Admit Type: Inpatient Procedure:                Small bowel enteroscopy Indications:              Active gastrointestinal bleeding Providers:                Jeani Hawking, MD, Adolph Pollack, RN, Irene Shipper,                            Technician Referring MD:              Medicines:                Midazolam 2 mg IV Complications:            No immediate complications. Estimated Blood Loss:     Estimated blood loss: none. Procedure:                Pre-Anesthesia Assessment:                           - Prior to the procedure, a History and Physical                            was performed, and patient medications and                            allergies were reviewed. The patient's tolerance of                            previous anesthesia was also reviewed. The risks                            and benefits of the procedure and the sedation                            options and risks were discussed with the patient.                            All questions were answered, and informed consent                            was obtained. Prior Anticoagulants: The patient has                            taken no previous anticoagulant or antiplatelet                            agents. ASA Grade Assessment: IV - A patient with                            severe systemic disease that is a constant threat  to life. After reviewing the risks and benefits,                            the patient was deemed in satisfactory condition to                            undergo the procedure.                           - The sedation level attained was moderate.                           After obtaining informed consent, the endoscope was                            passed under direct vision. Throughout  the                            procedure, the patient's blood pressure, pulse, and                            oxygen saturations were monitored continuously. The                            [id number] Olympus peds colonoscope was                            introduced through the mouth, and advanced to the                            mid-jejunum. The upper GI endoscopy was                            accomplished without difficulty. The patient                            tolerated the procedure well. After obtaining                            informed consent, the endoscope was passed under                            direct vision. Throughout the procedure, the                            patient's blood pressure, pulse, and oxygen                            saturations were monitored continuously. Scope In: Scope Out: Findings:      The esophagus was normal.      Patchy moderate inflammation characterized by erythema was found in the       gastric body.      The examined duodenum was normal.      Few non-bleeding superficial jejunal ulcers with a clean ulcer base       (  Forrest Class III) were found in the mid-jejunum. The largest lesion       was 5 mm in largest dimension. Biopsies were taken with a cold forceps       for histology.      For reasons that are not clear all the images of the jejunum did not       capture. In the lumen of the small bowel there was no evidence of any       blood. The prior area of tattoo and hemoclipping were identified and       passed. The pediatric colonoscope was able to travel much further distal       to the tattooed/hemoclipped area. Again, there was no evidence of any       bleeding. 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 One of the diverticula did exhibit an ulceration, but it was       clean based. Segmentally there was evidence of ulcerations and denuding       of the mucosa consistent with inflammation.  Careful and prolonged       examination of these areas did not exhibit any bleeding and no bleeding       was induced with provocation using a biopsy forcep. Several cold       biopsies were obtained of the inflammation. There was no evidence of any       visible vessels or hemocystic spots. It may be that the embolization       over time devitalized the bleeding vessel as the patient's last bleeding       episode was this AM. An I-Stat before the enteroscopy reported an HGB in       the 10 g/dL range and his pressor requirements were discontinued earlier       today. Impression:               - Normal esophagus.                           - Gastritis.                           - Normal examined duodenum.                           - Non-bleeding jejunal ulcers with a clean ulcer                            base (Forrest Class III) Biopsied. Recommendation:           - Return patient to ICU for ongoing care.                           - Monitor HGB and transfuse as necessary.                           - If severe bleeding recurs, a repeat CT angio is                            recommended. Procedure Code(s):        --- Professional ---  [id number], Small intestinal endoscopy, enteroscopy                            beyond second portion of duodenum, not including                            ileum; with biopsy, single or multiple Diagnosis Code(s):        --- Professional ---                           K28.9, Gastrojejunal ulcer, unspecified as acute or                            chronic, without hemorrhage or perforation                           K29.70, Gastritis, unspecified, without bleeding                           K92.2, Gastrointestinal hemorrhage, unspecified CPT copyright 2019 [employer]. All rights reserved. The codes documented in this report are preliminary and upon coder review may  be revised to meet current compliance requirements. Carol Ada, MD Carol Ada, MD 09/10/2021 6:24:57 PM This report has been signed electronically. Number of Addenda: 0

## 2021-09-10 NOTE — Consult Note (Signed)
Reason for Consult:Severe posthemorrhagic anemia with blood in stool.. Referring Physician: CCM.  Troy Randall is an 61 y.o. male.  HPI: Troy Randall is a 61 year old white male, who was admitted to [facility] on 09/04/21 for rectal bleeding. A CTA was done on 09/05/21 which was negative. Bleeding scan on 09/06/21 identified active arterial phase extravasation in a loop of small bowel in the LUQ. Colonoscopy done on 09/06/21 was unrevealing and an EGD and a small bowel enteroscopy done on revealed clots in the jejunum; one hemostatic clips was applied and the area was tattoed but no source of bleeding could be identified. 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 Then a mesenteric angiogram was done by IR with coil embolization of the SMA territory jejunal arcade branch but inspite of that the patient has continued to have melena with post-hemorrhagic anemia, requiring pressors. Cholecystitis was also noted on the scan and therefore a cholecystostomy was done by IR on 09/07/21 with sub-selective injections of multiple branches of the SMA.  Repeat CT ordered by surgery today revealed no definite source of physician to suggest active bleeding.  Patient received over 18 units of packed red blood cells since admission. His wife tells me that him taking Aleve twice a day for back pain for the last couple of years. He was also on Plavix and 81 mg of Aspirin prior to admission for stroke.  Patient had large maroon BM at 930 this morning his hemoglobin dropped to a 5.3 g/dL and he has received 3 units of packed red blood cells since then.  He has been on CRRT since 9 4 along with pressure support.  Past Medical History:  Diagnosis Date   Anemia    Arthritis    CVA (cerebral vascular accident) ([place])    Diabetes mellitus without complication ([place])    Hyperlipidemia    Hypertension    Kidney stones    Past Surgical History:  Procedure Laterality Date   ANKLE SURGERY     HERNIA REPAIR     umbilical x1 Incisional x1    INCISIONAL HERNIA REPAIR  04/13/2011   Procedure: LAPAROSCOPIC INCISIONAL HERNIA;  Surgeon: Jamesetta So, MD;  Location: [facility];  Service: General;  Laterality: N/A;  Recurrent Laparoscopic Incisional Herniorraphy with Mesh   IR ANGIOGRAM SELECTIVE EACH ADDITIONAL VESSEL  09/09/2021   IR ANGIOGRAM VISCERAL SELECTIVE  09/07/2021   IR ANGIOGRAM VISCERAL SELECTIVE  09/06/2021   IR ANGIOGRAM VISCERAL SELECTIVE  09/06/2021   IR EMBO ART  VEN HEMORR LYMPH EXTRAV  INC GUIDE ROADMAPPING  09/06/2021   IR GUIDED DRAIN W CATHETER PLACEMENT  09/07/2021   IR US GUIDE BX ASP/DRAIN  09/07/2021   IR US GUIDE VASC ACCESS RIGHT  09/07/2021   IR US GUIDE VASC ACCESS RIGHT  09/06/2021   KIDNEY STONE SURGERY     Family History  Problem Relation Age of Onset   Heart failure Mother    Diabetes Father    Cancer Other    Heart attack Other    Anesthesia problems Neg Hx    Hypotension Neg Hx    Malignant hyperthermia Neg Hx    Pseudochol deficiency Neg Hx    Colon cancer Neg Hx    Inflammatory bowel disease Neg Hx    Social History:  reports that he has never smoked. His smokeless tobacco use includes snuff. He reports that he does not drink alcohol and does not use drugs.  Allergies:  Allergies  Allergen Reactions   Metformin And  Related     GI upset   Penicillins Hives    Medications: I have reviewed the patient's current medications. Prior to Admission:  Medications Prior to Admission  Medication Sig Dispense Refill Last Dose   amLODipine (NORVASC) 10 MG tablet Take 1 tablet (10 mg total) by mouth daily. 90 tablet 3 09/04/2021   aspirin EC 81 MG tablet Take 81 mg by mouth daily. Swallow whole.   09/04/2021   clopidogrel (PLAVIX) 75 MG tablet Take 1 tablet (75 mg total) by mouth daily. 30 tablet 0 09/04/2021 at 0730   glipiZIDE (GLUCOTROL XL) 10 MG 24 hr tablet Take 1 tablet (10 mg total) by mouth daily with breakfast. 90 tablet 1 09/03/2021   levocetirizine (XYZAL) 5 MG tablet Take 1 tablet by mouth at bedtime.    09/03/2021   lisinopril (PRINIVIL,ZESTRIL) 40 MG tablet Take 1 tablet (40 mg total) by mouth daily. 90 tablet 3 09/04/2021   metFORMIN (GLUCOPHAGE-XR) 500 MG 24 hr tablet Take 500-1,000 mg by mouth in the morning and at bedtime.   09/04/2021   rosuvastatin (CRESTOR) 40 MG tablet Take 1 tablet (40 mg total) by mouth daily. 30 tablet 1 09/04/2021   tamsulosin (FLOMAX) 0.4 MG CAPS capsule Take 0.4 mg by mouth daily.   09/03/2021   traZODone (DESYREL) 150 MG tablet Take 150 mg by mouth at bedtime.   09/03/2021   TRESIBA FLEXTOUCH 200 UNIT/ML FlexTouch Pen Inject 20 Units into the skin in the morning and at bedtime.   09/04/2021   BD VEO INSULIN SYRINGE U/F 31G X 15/64" 1 ML MISC  USE AS DIRECTED 100 each 2    glucose blood (ONETOUCH VERIO) test strip TEST twice a day 100 each 3    Insulin Pen Needle (NOVOTWIST) 32G X 5 MM MISC Use two daily to inject Victoza and Toujeo. 90 each 5    ONETOUCH DELICA LANCETS 99991111 MISC Use to check blood sugar once a day dx code E11.65 50 each 3    Scheduled:  sodium chloride   Intravenous Once   sodium chloride   Intravenous Once   sodium chloride   Intravenous Once   sodium chloride   Intravenous Once   Chlorhexidine Gluconate Cloth  6 each Topical Daily   feeding supplement (PROSource TF20)  60 mL Per Tube 5 X Daily   fentaNYL (SUBLIMAZE) injection  50 mcg Intravenous Once   hydrocortisone sod succinate (SOLU-CORTEF) inj  100 mg Intravenous Q12H   insulin aspart  0-20 Units Subcutaneous Q4H   insulin glargine-yfgn  20 Units Subcutaneous Daily   mupirocin ointment  1 Application Nasal BID   mouth rinse  15 mL Mouth Rinse Q2H   pantoprazole (PROTONIX) IV  40 mg Intravenous Q24H   sodium chloride flush  10-40 mL Intracatheter Q12H   sodium chloride flush  5 mL Intracatheter Q8H   Continuous:   prismasol BGK 4/2.5 400 mL/hr at 09/10/21 0607    prismasol BGK 4/2.5 400 mL/hr at 09/10/21 1041   sodium chloride     sodium chloride Stopped (09/10/21 1046)   feeding  supplement (VITAL 1.5 CAL) Stopped (09/10/21 0935)   fentaNYL infusion INTRAVENOUS 50 mcg/hr (09/10/21 1400)   lactated ringers     meropenem (MERREM) IV 200 mL/hr at 09/10/21 1400   norepinephrine (LEVOPHED) Adult infusion 4 mcg/min (09/10/21 1405)   phenylephrine (NEO-SYNEPHRINE) Adult infusion Stopped (09/09/21 1258)   prismasol BGK 4/2.5 1,800 mL/hr at 09/10/21 1330   vasopressin Stopped (09/10/21 1211)  Results for orders placed or performed during the hospital encounter of 09/04/21 (from the past 48 hour(s))  CBC     Status: Abnormal   Collection Time: 09/08/21  6:08 PM  Result Value Ref Range   WBC 28.7 (H) 4.0 - 10.5 K/uL    Comment: WHITE COUNT CONFIRMED ON SMEAR   RBC 2.30 (L) 4.22 - 5.81 MIL/uL   Hemoglobin 7.1 (L) 13.0 - 17.0 g/dL   HCT 45.8 (L) 09.9 - 83.3 %   MCV 86.1 80.0 - 100.0 fL   MCH 30.9 26.0 - 34.0 pg   MCHC 35.9 30.0 - 36.0 g/dL   RDW 82.5 05.3 - 97.6 %   Platelets 122 (L) 150 - 400 K/uL    Comment: REPEATED TO VERIFY   nRBC 1.6 (H) 0.0 - 0.2 %    Comment: Performed at [facility], [address], [place], [place] [address]  Renal function panel (daily at 1600)     Status: Abnormal   Collection Time: 09/08/21  6:08 PM  Result Value Ref Range   Sodium 137 135 - 145 mmol/L   Potassium 5.0 3.5 - 5.1 mmol/L   Chloride 110 98 - 111 mmol/L   CO2 19 (L) 22 - 32 mmol/L   Glucose, Bld 228 (H) 70 - 99 mg/dL    Comment: Glucose reference range applies only to samples taken after fasting for at least 8 hours.   BUN 48 (H) 6 - 20 mg/dL   Creatinine, Ser 3.79 (H) 0.61 - 1.24 mg/dL   Calcium 6.0 (LL) 8.9 - 10.3 mg/dL    Comment: CRITICAL RESULT CALLED TO, READ BACK BY AND VERIFIED WITH ERIBERTO REYNA RN 09/08/21 @1925  BY J. WHITE   Phosphorus 5.1 (H) 2.5 - 4.6 mg/dL   Albumin (L) 3.5 - 5.0 g/dL   GFR, Estimated 24 (L) >60 mL/min    Comment: (NOTE) Calculated using the CKD-EPI Creatinine Equation (2021)    Anion gap 8 5 - 15    Comment: Performed at  [facility], [address] [place], [place]  Magnesium     Status: None   Collection Time: 09/08/21  6:08 PM  Result Value Ref Range   Magnesium 1.9 1.7 - 2.4 mg/dL    Comment: Performed at [facility], [address] [place], [place]  Glucose, capillary     Status: Abnormal   Collection Time: 09/08/21  7:07 PM  Result Value Ref Range   Glucose-Capillary 203 (H) 70 - 99 mg/dL    Comment: Glucose reference range applies only to samples taken after fasting for at least 8 hours.  I-STAT 7, (LYTES, BLD GAS, ICA, H+H)     Status: Abnormal   Collection Time: 09/08/21  7:07 PM  Result Value Ref Range   pH, Arterial 7.353 7.35 - 7.45   pCO2 arterial 36.1 32 - 48 mmHg   pO2, Arterial 123 (H) 83 - 108 mmHg   Bicarbonate 19.9 (L) 20.0 - 28.0 mmol/L   TCO2 21 (L) 22 - 32 mmol/L   O2 Saturation 99 %   Acid-base deficit 5.0 (H) 0.0 - 2.0 mmol/L   Sodium 136 135 - 145 mmol/L   Potassium 5.0 3.5 - 5.1 mmol/L   Calcium, Ion 0.95 (L) 1.15 - 1.40 mmol/L   HCT 17.0 (L) 39.0 - 52.0 %   Hemoglobin 5.8 (LL) 13.0 - 17.0 g/dL   Patient temperature 11/08/21 C    Collection site art  line    Drawn by Nurse    Sample type ARTERIAL    Comment NOTIFIED PHYSICIAN   Prepare RBC (crossmatch)     Status: None   Collection Time: 09/08/21  7:32 PM  Result Value Ref Range   Order Confirmation      ORDER PROCESSED BY BLOOD BANK Performed at [facility], [place] [address] [place], [place] [address]   CBC     Status: Abnormal   Collection Time: 09/08/21 11:26 PM  Result Value Ref Range   WBC 26.2 (H) 4.0 - 10.5 K/uL   RBC 2.72 (L) 4.22 - 5.81 MIL/uL   Hemoglobin 8.1 (L) 13.0 - 17.0 g/dL   HCT 22.5 (L) 39.0 - 52.0 %   MCV 82.7 80.0 - 100.0 fL   MCH 29.8 26.0 - 34.0 pg   MCHC 36.0 30.0 - 36.0 g/dL   RDW 16.7 (H) 11.5 - 15.5 %   Platelets 107 (L) 150 - 400 K/uL   nRBC 1.4 (H) 0.0 - 0.2 %    Comment: Performed at [facility] [address], [place], [place]  [address]  Glucose, capillary     Status: Abnormal   Collection Time: 09/08/21 11:29 PM  Result Value Ref Range   Glucose-Capillary 188 (H) 70 - 99 mg/dL    Comment: Glucose reference range applies only to samples taken after fasting for at least 8 hours.  CBC     Status: Abnormal   Collection Time: 09/09/21  4:26 AM  Result Value Ref Range   WBC 25.4 (H) 4.0 - 10.5 K/uL   RBC 2.56 (L) 4.22 - 5.81 MIL/uL   Hemoglobin 7.5 (L) 13.0 - 17.0 g/dL   HCT 21.4 (L) 39.0 - 52.0 %   MCV 83.6 80.0 - 100.0 fL   MCH 29.3 26.0 - 34.0 pg   MCHC 35.0 30.0 - 36.0 g/dL   RDW 17.2 (H) 11.5 - 15.5 %   Platelets 107 (L) 150 - 400 K/uL    Comment: REPEATED TO VERIFY   nRBC 1.3 (H) 0.0 - 0.2 %    Comment: Performed at [facility], [place] [address] [place], [place] [address]  Renal function panel (daily at 0500)     Status: Abnormal   Collection Time: 09/09/21  4:26 AM  Result Value Ref Range   Sodium 139 135 - 145 mmol/L   Potassium 4.7 3.5 - 5.1 mmol/L   Chloride 106 98 - 111 mmol/L   CO2 22 22 - 32 mmol/L   Glucose, Bld 208 (H) 70 - 99 mg/dL    Comment: Glucose reference range applies only to samples taken after fasting for at least 8 hours.   BUN 38 (H) 6 - 20 mg/dL   Creatinine, Ser 2.09 (H) 0.61 - 1.24 mg/dL   Calcium 6.6 (L) 8.9 - 10.3 mg/dL   Phosphorus 4.3 2.5 - 4.6 mg/dL   Albumin <1.5 (L) 3.5 - 5.0 g/dL   GFR, Estimated 36 (L) >60 mL/min    Comment: (NOTE) Calculated using the CKD-EPI Creatinine Equation (2021)    Anion gap 11 5 - 15    Comment: Performed at [facility] [address], [place], [place] [address]  Magnesium     Status: None   Collection Time: 09/09/21  4:26 AM  Result Value Ref Range   Magnesium 1.9 1.7 - 2.4 mg/dL    Comment: Performed at [facility] [address], [place], [place]  [id number]  Glucose, capillary     Status: Abnormal   Collection Time: 09/09/21  4:30 AM  Result Value Ref Range   Glucose-Capillary 192 (H) 70 - 99 mg/dL    Comment:  Glucose reference range applies only to samples taken after fasting for at least 8 hours.  Glucose, capillary     Status: Abnormal   Collection Time: 09/09/21  7:20 AM  Result Value Ref Range   Glucose-Capillary 164 (H) 70 - 99 mg/dL    Comment: Glucose reference range applies only to samples taken after fasting for at least 8 hours.  CBC     Status: Abnormal   Collection Time: 09/09/21 10:01 AM  Result Value Ref Range   WBC 24.6 (H) 4.0 - 10.5 K/uL   RBC 2.40 (L) 4.22 - 5.81 MIL/uL   Hemoglobin 7.1 (L) 13.0 - 17.0 g/dL   HCT 19.8 (L) 39.0 - 52.0 %   MCV 82.5 80.0 - 100.0 fL   MCH 29.6 26.0 - 34.0 pg   MCHC 35.9 30.0 - 36.0 g/dL   RDW 17.4 (H) 11.5 - 15.5 %   Platelets 101 (L) 150 - 400 K/uL    Comment: Immature Platelet Fraction may be clinically indicated, consider ordering this additional test [id number] REPEATED TO VERIFY    nRBC 1.5 (H) 0.0 - 0.2 %    Comment: Performed at [facility], [address], [place], [place] [address]  Glucose, capillary     Status: Abnormal   Collection Time: 09/09/21 11:24 AM  Result Value Ref Range   Glucose-Capillary 163 (H) 70 - 99 mg/dL    Comment: Glucose reference range applies only to samples taken after fasting for at least 8 hours.  Glucose, capillary     Status: Abnormal   Collection Time: 09/09/21  3:02 PM  Result Value Ref Range   Glucose-Capillary 182 (H) 70 - 99 mg/dL    Comment: Glucose reference range applies only to samples taken after fasting for at least 8 hours.  CBC     Status: Abnormal   Collection Time: 09/09/21  5:38 PM  Result Value Ref Range   WBC 16.7 (H) 4.0 - 10.5 K/uL   RBC 1.97 (L) 4.22 - 5.81 MIL/uL   Hemoglobin 5.8 (LL) 13.0 - 17.0 g/dL    Comment: REPEATED TO VERIFY THIS CRITICAL RESULT HAS VERIFIED AND BEEN CALLED TO D.OVALNADER RN BY KATHERINE MCCORMICK ON 09 05 2023 AT 1811, AND HAS BEEN READ BACK.     HCT 16.5 (L) 39.0 - 52.0 %   MCV 83.8 80.0 - 100.0 fL   MCH 29.4 26.0 - 34.0 pg   MCHC 35.2  30.0 - 36.0 g/dL   RDW 18.0 (H) 11.5 - 15.5 %   Platelets 73 (L) 150 - 400 K/uL    Comment: Immature Platelet Fraction may be clinically indicated, consider ordering this additional test [id number] REPEATED TO VERIFY    nRBC 1.7 (H) 0.0 - 0.2 %    Comment: Performed at [facility] [address], [place], [place] [address]  Renal function panel (daily at 1600)     Status: Abnormal   Collection Time: 09/09/21  5:38 PM  Result Value Ref Range   Sodium 139 135 - 145 mmol/L   Potassium 4.3 3.5 - 5.1 mmol/L   Chloride 110 98 - 111 mmol/L   CO2 24 22 - 32 mmol/L   Glucose, Bld 234 (H) 70 - 99 mg/dL    Comment: Glucose  reference range applies only to samples taken after fasting for at least 8 hours.   BUN 39 (H) 6 - 20 mg/dL   Creatinine, Ser 1.65 (H) 0.61 - 1.24 mg/dL    Comment: DELTA CHECK NOTED   Calcium 6.5 (L) 8.9 - 10.3 mg/dL   Phosphorus 3.2 2.5 - 4.6 mg/dL   Albumin <1.5 (L) 3.5 - 5.0 g/dL   GFR, Estimated 48 (L) >60 mL/min    Comment: (NOTE) Calculated using the CKD-EPI Creatinine Equation (2021)    Anion gap 5 5 - 15    Comment: Performed at [facility] [address] [place], [place] [address]  Prepare RBC (crossmatch)     Status: None   Collection Time: 09/09/21  6:13 PM  Result Value Ref Range   Order Confirmation      ORDER PROCESSED BY BLOOD BANK Performed at [facility], [place] [address] [place], [place] [address]   Glucose, capillary     Status: Abnormal   Collection Time: 09/09/21  7:27 PM  Result Value Ref Range   Glucose-Capillary 237 (H) 70 - 99 mg/dL    Comment: Glucose reference range applies only to samples taken after fasting for at least 8 hours.  CBC     Status: Abnormal   Collection Time: 09/09/21  9:12 PM  Result Value Ref Range   WBC 15.6 (H) 4.0 - 10.5 K/uL   RBC 2.17 (L) 4.22 - 5.81 MIL/uL   Hemoglobin 6.4 (LL) 13.0 - 17.0 g/dL    Comment: REPEATED TO VERIFY THIS CRITICAL RESULT HAS VERIFIED AND BEEN CALLED TO RN TOLER  MICHELLE BY PAMELA Tama ON 09 05 2023 AT 2132, AND HAS BEEN READ BACK.     HCT 18.2 (L) 39.0 - 52.0 %   MCV 83.9 80.0 - 100.0 fL   MCH 29.5 26.0 - 34.0 pg   MCHC 35.2 30.0 - 36.0 g/dL   RDW 17.2 (H) 11.5 - 15.5 %   Platelets 76 (L) 150 - 400 K/uL    Comment: Immature Platelet Fraction may be clinically indicated, consider ordering this additional test [id number]    nRBC 1.9 (H) 0.0 - 0.2 %    Comment: Performed at [facility] [address], [place], [place] [address]  Prepare RBC (crossmatch)     Status: None   Collection Time: 09/09/21  9:47 PM  Result Value Ref Range   Order Confirmation      ORDER PROCESSED BY BLOOD BANK Performed at [facility], [place] [address], [place], [place] [address]   Glucose, capillary     Status: Abnormal   Collection Time: 09/09/21 11:03 PM  Result Value Ref Range   Glucose-Capillary 242 (H) 70 - 99 mg/dL    Comment: Glucose reference range applies only to samples taken after fasting for at least 8 hours.  Lactic acid, plasma     Status: None   Collection Time: 09/09/21 11:51 PM  Result Value Ref Range   Lactic Acid, Venous 1.1 0.5 - 1.9 mmol/L    Comment: Performed at [facility], [place] [address] [place], [place] [address]  Hemoglobin and hematocrit, blood     Status: Abnormal   Collection Time: 09/09/21 11:51 PM  Result Value Ref Range   Hemoglobin 7.3 (L) 13.0 - 17.0 g/dL   HCT 21.3 (L) 39.0 - 52.0 %    Comment: Performed at [facility] [address], [place], [place] [address]  Type and screen [place]     Status: None (Preliminary result)   Collection Time: 09/09/21 11:51 PM  Result Value Ref Range   ABO/RH(D) A NEG    Antibody Screen NEG    Sample Expiration 09/12/2021,2359    Unit Number [id number]    Blood Component Type RED CELLS,LR    Unit division 00    Status of Unit ISSUED    Transfusion Status OK TO TRANSFUSE    Crossmatch Result Compatible    Unit Number  [id number]    Blood Component Type RED CELLS,LR    Unit division 00    Status of Unit ISSUED    Transfusion Status OK TO TRANSFUSE    Crossmatch Result Compatible    Unit Number [id number]    Blood Component Type RED CELLS,LR    Unit division 00    Status of Unit ISSUED    Transfusion Status OK TO TRANSFUSE    Crossmatch Result      Compatible Performed at [facility], [place] [address] [place], [place] [address]   CBC     Status: Abnormal   Collection Time: 09/10/21  1:59 AM  Result Value Ref Range   WBC 18.6 (H) 4.0 - 10.5 K/uL   RBC 2.50 (L) 4.22 - 5.81 MIL/uL   Hemoglobin 7.1 (L) 13.0 - 17.0 g/dL   HCT 20.4 (L) 39.0 - 52.0 %   MCV 81.6 80.0 - 100.0 fL   MCH 28.4 26.0 - 34.0 pg   MCHC 34.8 30.0 - 36.0 g/dL   RDW 18.3 (H) 11.5 - 15.5 %   Platelets 66 (L) 150 - 400 K/uL    Comment: REPEATED TO VERIFY   nRBC 1.7 (H) 0.0 - 0.2 %    Comment: Performed at [facility] [address], [place], [place] [address]  Renal function panel (daily at 0500)     Status: Abnormal   Collection Time: 09/10/21  1:59 AM  Result Value Ref Range   Sodium 138 135 - 145 mmol/L   Potassium 4.2 3.5 - 5.1 mmol/L   Chloride 108 98 - 111 mmol/L   CO2 25 22 - 32 mmol/L   Glucose, Bld 295 (H) 70 - 99 mg/dL    Comment: Glucose reference range applies only to samples taken after fasting for at least 8 hours.   BUN 36 (H) 6 - 20 mg/dL   Creatinine, Ser 1.27 (H) 0.61 - 1.24 mg/dL   Calcium 6.7 (L) 8.9 - 10.3 mg/dL   Phosphorus 2.3 (L) 2.5 - 4.6 mg/dL   Albumin <1.5 (L) 3.5 - 5.0 g/dL   GFR, Estimated >60 >60 mL/min    Comment: (NOTE) Calculated using the CKD-EPI Creatinine Equation (2021)    Anion gap 5 5 - 15    Comment: Performed at [facility] [address], [place], [place] [address]  Magnesium     Status: None   Collection Time: 09/10/21  1:59 AM  Result Value Ref Range   Magnesium 2.4 1.7 - 2.4 mg/dL    Comment: Performed at [facility] [address], [place], [place] [address]  Glucose, capillary     Status: Abnormal   Collection Time: 09/10/21  3:34 AM  Result Value Ref Range   Glucose-Capillary 294 (H) 70 - 99 mg/dL    Comment: Glucose reference range applies only to samples taken after fasting for at least 8 hours.  Technologist smear review  Status: None   Collection Time: 09/10/21  5:20 AM  Result Value Ref Range   WBC MORPHOLOGY MILD LEFT SHIFT (1-5% METAS, OCC MYELO, OCC BANDS)    RBC MORPHOLOGY SMUDGE CELLS     Comment: POLYCHROMASIA PRESENT   Plt Morphology Normal platelet morphology    Clinical Information thrombocytopenia     Comment: Performed at [facility], [address] [place], [place] [address]  CBC with Differential/Platelet     Status: Abnormal   Collection Time: 09/10/21  5:20 AM  Result Value Ref Range   WBC 16.9 (H) 4.0 - 10.5 K/uL   RBC 2.41 (L) 4.22 - 5.81 MIL/uL   Hemoglobin 7.0 (L) 13.0 - 17.0 g/dL   HCT 53.6 (L) 64.4 - 03.4 %   MCV 82.6 80.0 - 100.0 fL   MCH 29.0 26.0 - 34.0 pg   MCHC 35.2 30.0 - 36.0 g/dL   RDW 74.2 (H) 59.5 - 63.8 %   Platelets 66 (L) 150 - 400 K/uL    Comment: Immature Platelet Fraction may be clinically indicated, consider ordering this additional test [id number] REPEATED TO VERIFY    nRBC 1.6 (H) 0.0 - 0.2 %   Neutrophils Relative % 89 %   Neutro Abs 15.1 (H) 1.7 - 7.7 K/uL   Lymphocytes Relative 3 %   Lymphs Abs 0.4 (L) 0.7 - 4.0 K/uL   Monocytes Relative 4 %   Monocytes Absolute 0.8 0.1 - 1.0 K/uL   Eosinophils Relative 0 %   Eosinophils Absolute 0.0 0.0 - 0.5 K/uL   Basophils Relative 0 %   Basophils Absolute 0.0 0.0 - 0.1 K/uL   WBC Morphology MILD LEFT SHIFT (1-5% METAS, OCC MYELO, OCC BANDS)    RBC Morphology See Note    Smear Review Normal platelet morphology    Immature Granulocytes 4 %   Abs Immature Granulocytes 0.62 (H) 0.00 - 0.07 K/uL   Smudge Cells PRESENT    Polychromasia PRESENT     Comment: Performed at [facility], [address] [place], [place] [address]  CBC     Status: Abnormal   Collection Time: 09/10/21  5:22 AM  Result Value Ref Range   WBC 16.7 (H) 4.0 - 10.5 K/uL   RBC 2.44 (L) 4.22 - 5.81 MIL/uL   Hemoglobin 6.8 (LL) 13.0 - 17.0 g/dL    Comment: REPEATED TO VERIFY THIS CRITICAL RESULT HAS VERIFIED AND BEEN CALLED TO HAILEY VERMILLION RN BY PAMELA HENDERSON ON 09 06 2023 AT 0601, AND HAS BEEN READ BACK.     HCT 20.3 (L) 39.0 - 52.0 %   MCV 83.2 80.0 - 100.0 fL   MCH 27.9 26.0 - 34.0 pg   MCHC 33.5 30.0 - 36.0 g/dL   RDW 16.6 (H) 06.3 - 01.6 %   Platelets 67 (L) 150 - 400 K/uL    Comment: REPEATED TO VERIFY   nRBC 1.6 (H) 0.0 - 0.2 %    Comment: Performed at [facility], [address], [place], [place] [address]  Urinalysis, Routine w reflex microscopic Urine, Catheterized     Status: Abnormal   Collection Time: 09/10/21  5:32 AM  Result Value Ref Range   Color, Urine AMBER (A) YELLOW    Comment: BIOCHEMICALS MAY BE AFFECTED BY COLOR   APPearance HAZY (A) CLEAR   Specific Gravity, Urine 1.024 1.005 - 1.030   pH 5.0 5.0 - 8.0   Glucose, UA >=500 (A) NEGATIVE mg/dL  Hgb urine dipstick NEGATIVE NEGATIVE   Bilirubin Urine NEGATIVE NEGATIVE   Ketones, ur NEGATIVE NEGATIVE mg/dL   Protein, ur 30 (A) NEGATIVE mg/dL   Nitrite NEGATIVE NEGATIVE   Leukocytes,Ua NEGATIVE NEGATIVE   RBC / HPF 0-5 0 - 5 RBC/hpf   WBC, UA 0-5 0 - 5 WBC/hpf   Bacteria, UA RARE (A) NONE SEEN   Squamous Epithelial / LPF 0-5 0 - 5   Mucus PRESENT     Comment: Performed at [facility], [place] [address] [place], [place] [address]  Prepare RBC (crossmatch)     Status: None   Collection Time: 09/10/21  6:23 AM  Result Value Ref Range   Order Confirmation      ORDER PROCESSED BY BLOOD BANK Performed at [facility], [place] [address] [place], [place] [address]   Glucose, capillary     Status: Abnormal   Collection Time: 09/10/21  7:15 AM  Result Value Ref Range   Glucose-Capillary 303 (H) 70 - 99 mg/dL     Comment: Glucose reference range applies only to samples taken after fasting for at least 8 hours.  Hemoglobin and hematocrit, blood     Status: Abnormal   Collection Time: 09/10/21 10:47 AM  Result Value Ref Range   Hemoglobin 5.3 (LL) 13.0 - 17.0 g/dL    Comment: CRITICAL VALUE NOTED.  VALUE IS CONSISTENT WITH PREVIOUSLY REPORTED AND CALLED VALUE. REPEATED TO VERIFY    HCT 15.9 (L) 39.0 - 52.0 %    Comment: Performed at [facility], [place] [address] [place], [place] [address]  Glucose, capillary     Status: Abnormal   Collection Time: 09/10/21 11:10 AM  Result Value Ref Range   Glucose-Capillary 135 (H) 70 - 99 mg/dL    Comment: Glucose reference range applies only to samples taken after fasting for at least 8 hours.  Prepare RBC (crossmatch)     Status: None   Collection Time: 09/10/21 11:27 AM  Result Value Ref Range   Order Confirmation      ORDER PROCESSED BY BLOOD BANK Performed at [facility], [place] [address] [place], [place] [address]   Lactic acid, plasma     Status: None   Collection Time: 09/10/21 12:31 PM  Result Value Ref Range   Lactic Acid, Venous 1.5 0.5 - 1.9 mmol/L    Comment: Performed at [facility] [address] [place], [place] [address]  Glucose, capillary     Status: Abnormal   Collection Time: 09/10/21  3:28 PM  Result Value Ref Range   Glucose-Capillary 216 (H) 70 - 99 mg/dL    Comment: Glucose reference range applies only to samples taken after fasting for at least 8 hours.    ECHOCARDIOGRAM COMPLETE  Result Date: 09/09/2021    ECHOCARDIOGRAM REPORT   Patient Name:   TANISHA EDELMAN Date of Exam: 09/09/2021 Medical Rec #:  [id number]       Height:       72.0 in Accession #:    [id number]      Weight:       344.1 lb Date of Birth:  17-May-1960       BSA:          2.683 m Patient Age:    41 years        BP:           120/78 mmHg Patient Gender: M  HR:           95 bpm. Exam Location:  Inpatient Procedure: 2D Echo,  Cardiac Doppler and Color Doppler Indications:    Acute respiratory distress  History:        Patient has prior history of Echocardiogram examinations, most                 recent 04/29/2021. Risk Factors:Hypertension, Diabetes and                 Dyslipidemia.  Sonographer:    Jefferey Pica Referring Phys: [phone] MURALI RAMASWAMY  Sonographer Comments: Unable to use Definity due to no IV access. IMPRESSIONS  1. Left ventricular ejection fraction, by estimation, is 65 to 70%. The left ventricle has hyperdynamic function. The left ventricle has no regional wall motion abnormalities. Left ventricular diastolic parameters are indeterminate.  2. Right ventricular systolic function is normal. The right ventricular size is normal. Tricuspid regurgitation signal is inadequate for assessing PA pressure.  3. The mitral valve is normal in structure. No evidence of mitral valve regurgitation.  4. The aortic valve is tricuspid. There is mild calcification of the aortic valve. There is mild thickening of the aortic valve. Aortic valve regurgitation is not visualized. Aortic valve sclerosis is present, with no evidence of aortic valve stenosis. Comparison(s): No significant change from prior study. Prior images reviewed side by side. FINDINGS  Left Ventricle: Left ventricular ejection fraction, by estimation, is 65 to 70%. The left ventricle has hyperdynamic function. The left ventricle has no regional wall motion abnormalities. The left ventricular internal cavity size was normal in size. Suboptimal image quality limits for assessment of left ventricular hypertrophy. Left ventricular diastolic parameters are indeterminate. Right Ventricle: The right ventricular size is normal. Right vetricular wall thickness was not well visualized. Right ventricular systolic function is normal. Tricuspid regurgitation signal is inadequate for assessing PA pressure. Left Atrium: Left atrial size was normal in size. Right Atrium: Right atrial  size was normal in size. Pericardium: There is no evidence of pericardial effusion. Mitral Valve: The mitral valve is normal in structure. No evidence of mitral valve regurgitation. Tricuspid Valve: The tricuspid valve is normal in structure. Tricuspid valve regurgitation is not demonstrated. Aortic Valve: The aortic valve is tricuspid. There is mild calcification of the aortic valve. There is mild thickening of the aortic valve. Aortic valve regurgitation is not visualized. Aortic valve sclerosis is present, with no evidence of aortic valve stenosis. Aortic valve peak gradient measures 13.0 mmHg. Pulmonic Valve: The pulmonic valve was not well visualized. Aorta: The aortic root was not well visualized. Venous: The inferior vena cava was not well visualized. IAS/Shunts: The interatrial septum was not well visualized.   Diastology LV e' medial:    4.73 cm/s LV E/e' medial:  12.7 LV e' lateral:   5.40 cm/s LV E/e' lateral: 11.1  LEFT ATRIUM             Index        RIGHT ATRIUM           Index LA Vol (A2C):   32.9 ml 12.26 ml/m  RA Area:     12.70 cm LA Vol (A4C):   35.4 ml 13.19 ml/m  RA Volume:   24.50 ml  9.13 ml/m LA Biplane Vol: 35.5 ml 13.23 ml/m  AORTIC VALVE              PULMONIC VALVE AV Vmax:      180.50 cm/s PV Vmax:  0.84 m/s AV Peak Grad: 13.0 mmHg   PV Peak grad:  2.8 mmHg LVOT Vmax:    142.00 cm/s LVOT Vmean:   95.200 cm/s LVOT VTI:     0.226 m MITRAL VALVE               TRICUSPID VALVE MV Area (PHT): 3.72 cm    TR Peak grad:   8.8 mmHg MV Decel Time: 204 msec    TR Vmax:        148.00 cm/s MV E velocity: 60.00 cm/s MV A velocity: 53.60 cm/s  SHUNTS MV E/A ratio:  1.12        Systemic VTI: 0.23 m Mihai Croitoru MD Electronically signed by Thurmon Fair MD Signature Date/Time: 09/09/2021/11:54:05 AM    Final     Review of Systems  Unable to perform ROS: Intubated   Blood pressure (!) 141/65, pulse 73, temperature 97.7 F (36.5 C), temperature source Esophageal, resp. rate 18, height 6'  (1.829 m), weight (!) 155.8 kg, SpO2 100 %. Physical Exam Vitals reviewed.  Constitutional:      Appearance: He is ill-appearing.     Comments: Patient is intubated  HENT:     Head: Normocephalic and atraumatic.  Cardiovascular:     Rate and Rhythm: Normal rate and regular rhythm.     Pulses: Normal pulses.     Heart sounds: Normal heart sounds.  Abdominal:     Palpations: Abdomen is soft.     Comments: Morbidly obese with hypoactive bowel sounds; there is a cholecystostomy tube in the right upper quadrant; midline scar at the level of the umbilicus from previous hernia repair  Skin:    General: Skin is warm and dry.   Assessment/Plan: 1) Rectal bleeding/melena with severe post hemorrhagic anemia-proceed with a repeat enteroscopy at this time. We will transfuse two units of PRBc's now.  2) Acute cholecystitis-percutaneous cholecystostomy drain in place on antibiotics for E. coli infection. 3) AKI on CRRT. 4) Acute respiratory failure on the ventilator. 5) HTN/HLD/AODM/Morbid obesity. 6) Morbid obesity. 7) History of  CVA on Aspirin and Plavix PTA.Marland Kitchen Charna Elizabeth 09/10/2021, 4:08 PM

## 2021-09-10 NOTE — Consult Note (Signed)
Troy Randall 07-20-1960  [id number].    Requesting MD: Dr. Chilton Greathouse Chief Complaint/Reason for Consult: GI bleed  HPI: Troy Randall  is a 61 y.o.  male with a hx of HTN, HLD, DM, CVA on ASA/Plavix prior to admission who we were asked to see for GI bleed.   Patient presented to [facility] ED on 8/31 for BRB per rectum. Admitted to [facility]. CTA 9/1 negative for active GI bleeding. CTA GI bleed scan 9/2 with active arterial phase extravasation identified in a small bowel loop of the left upper quadrant, likely jejunal with the source of the bleeding appearing to be a diverticulum and multiple additional diverticuli are seen in the duodenum and proximal small bowel. He underwent Colonoscopy by Dr. Levon Hedger 9/2 with preparation of the colon unsatisfactory, blood in the distal ileum, blood in the entire examined colon. Underwent upper endoscopy and small bowel endoscopy by Dr. Levon Hedger on 9/2 as well. This showed multiple large erosions with no stigmata of recent bleeding in the gastric body, a medium non-bleeding diverticulum in the second portion of the duodenum, small amount of clots in the proximal jejunum with evidence of an area that had  intermittent blood pumping in the proximal jejunum (unclear if this was a Dieulafoy for lesion vs jejunal diverticular bleeding). Dr. Levon Hedger used glucagon and attempted to reach this area but was unable. He was able to tattoo the area and place one hemostatic clip. Patient underwent IR mesenteric arteriography, coil emobolization of SMA territory, jejunal arcade branch on 9/2 by Dr. Milford Cage. 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 This showed Cholecystitis on imaging as well. Patient underwent Perc Chole by IR on 9/3 by Dr. Simonne Come. At that time also underwent post mesenteric arteriogram  with sub-selective injections of multiple secondary and tertirary branches of  the SMA supplying the area of extravasation on CTA performed day prior that was negative for discrete area of vessel irregularity or contrast extravasation.   Patient continues to have large maroon colored stool with the last episode around 930 am this morning. His Hgb was 12.2 on admit. 5.6 on 9/2 > 4.2 on 9/3. Peaked at 8.6 on 9/3 but has steadily downtrended/ranging from 5-7's since despite multiple units of PRBC (16 in total since admit). Last hgb 5.3 and 2U ordered to be transfused this am. He is currently intubated/on vent and sedated. He has been on CRRT since 9/4. Lactic cleared 9/5. Pressure requirements overall down; now off Phenylephrine and Vaso but still on 94mcg/min of Levo (was on up to 64mcg/min yesterday). Family is at bedside including his wife, brother and son. He has a hx of prior umbilical hernia repair and incisional hernia repair with mesh (15 x 20 cm Ethicon Physiomesh) by Dr. Lovell Sheehan in 2013.   ROS: ROS Limited as patient is on vent and sedated. See above. Per HPI.   Family History  Problem Relation Age of Onset   Heart failure Mother    Diabetes Father    Cancer Other    Heart attack Other    Anesthesia problems Neg Hx    Hypotension Neg Hx    Malignant hyperthermia Neg Hx    Pseudochol deficiency Neg Hx    Colon cancer Neg Hx    Inflammatory bowel disease Neg Hx     Past Medical History:  Diagnosis Date   Anemia    Arthritis  CVA (cerebral vascular accident) (HCC)    Diabetes mellitus without complication (HCC)    Hyperlipidemia    Hypertension    Kidney stones     Past Surgical History:  Procedure Laterality Date   ANKLE SURGERY     HERNIA REPAIR     umbilical x1 Incisional x1   INCISIONAL HERNIA REPAIR  04/13/2011   Procedure: LAPAROSCOPIC INCISIONAL HERNIA;  Surgeon: Dalia Heading, MD;  Location: [facility];  Service: General;  Laterality: N/A;  Recurrent Laparoscopic Incisional Herniorraphy with Mesh   IR ANGIOGRAM SELECTIVE EACH ADDITIONAL VESSEL   09/09/2021   IR ANGIOGRAM VISCERAL SELECTIVE  09/07/2021   IR ANGIOGRAM VISCERAL SELECTIVE  09/06/2021   IR ANGIOGRAM VISCERAL SELECTIVE  09/06/2021   IR EMBO ART  VEN HEMORR LYMPH EXTRAV  INC GUIDE ROADMAPPING  09/06/2021   IR GUIDED DRAIN W CATHETER PLACEMENT  09/07/2021   IR US GUIDE BX ASP/DRAIN  09/07/2021   IR US GUIDE VASC ACCESS RIGHT  09/07/2021   IR US GUIDE VASC ACCESS RIGHT  09/06/2021   KIDNEY STONE SURGERY      Social History:  reports that he has never smoked. His smokeless tobacco use includes snuff. He reports that he does not drink alcohol and does not use drugs.  Allergies:  Allergies  Allergen Reactions   Metformin And Related     GI upset   Penicillins Hives    Medications Prior to Admission  Medication Sig Dispense Refill   amLODipine (NORVASC) 10 MG tablet Take 1 tablet (10 mg total) by mouth daily. 90 tablet 3   aspirin EC 81 MG tablet Take 81 mg by mouth daily. Swallow whole.     clopidogrel (PLAVIX) 75 MG tablet Take 1 tablet (75 mg total) by mouth daily. 30 tablet 0   glipiZIDE (GLUCOTROL XL) 10 MG 24 hr tablet Take 1 tablet (10 mg total) by mouth daily with breakfast. 90 tablet 1   levocetirizine (XYZAL) 5 MG tablet Take 1 tablet by mouth at bedtime.     lisinopril (PRINIVIL,ZESTRIL) 40 MG tablet Take 1 tablet (40 mg total) by mouth daily. 90 tablet 3   metFORMIN (GLUCOPHAGE-XR) 500 MG 24 hr tablet Take 500-1,000 mg by mouth in the morning and at bedtime.     rosuvastatin (CRESTOR) 40 MG tablet Take 1 tablet (40 mg total) by mouth daily. 30 tablet 1   tamsulosin (FLOMAX) 0.4 MG CAPS capsule Take 0.4 mg by mouth daily.     traZODone (DESYREL) 150 MG tablet Take 150 mg by mouth at bedtime.     TRESIBA FLEXTOUCH 200 UNIT/ML FlexTouch Pen Inject 20 Units into the skin in the morning and at bedtime.     BD VEO INSULIN SYRINGE U/F 31G X 15/64" 1 ML MISC  USE AS DIRECTED 100 each 2   glucose blood (ONETOUCH VERIO) test strip TEST twice a day 100 each 3   Insulin Pen Needle  (NOVOTWIST) 32G X 5 MM MISC Use two daily to inject Victoza and Toujeo. 90 each 5   ONETOUCH DELICA LANCETS 33G MISC Use to check blood sugar once a day dx code E11.65 50 each 3   Vent Mode: PRVC FiO2 (%):  [40 %] 40 % Set Rate:  [18 bmp] 18 bmp Vt Set:  [620 mL] 620 mL PEEP:  [5 cmH20] 5 cmH20 Plateau Pressure:  [17 cmH20-19 cmH20] 19 cmH20   Physical Exam: Blood pressure (!) 140/60, pulse 71, temperature 97.7 F (36.5 C), resp. rate 18, height  6' (1.829 m), weight (!) 155.8 kg, SpO2 100 %. General: WD/WN white male who is sedated on vent  HEENT: head is normocephalic, atraumatic.  ET in place Heart: regular, rate, and rhythm.   Lungs: On vent. Settings as above. Coarse breath sounds b/l Abd: Soft, ND, no rigidity or guarding, +BS, IR Perc Chole drain with bilious output. Small periumbilical hernia is soft and reducible.  MS: no BUE/BLE edema Skin: warm and dry Psych: Sedated on vent. Neuro: Sedated on vent. Does not f/c for me   Results for orders placed or performed during the hospital encounter of 09/04/21 (from the past 48 hour(s))  CBC     Status: Abnormal   Collection Time: 09/08/21 12:22 PM  Result Value Ref Range   WBC 33.6 (H) 4.0 - 10.5 K/uL   RBC 2.13 (L) 4.22 - 5.81 MIL/uL   Hemoglobin 6.9 (LL) 13.0 - 17.0 g/dL    Comment: REPEATED TO VERIFY THIS CRITICAL RESULT HAS VERIFIED AND BEEN CALLED TO [facility] TALLENT RN. BY TAMEECO CALDWELL ON 09 04 2023 AT 1232, AND HAS BEEN READ BACK.     HCT 18.6 (L) 39.0 - 52.0 %   MCV 87.3 80.0 - 100.0 fL   MCH 32.4 26.0 - 34.0 pg   MCHC 37.1 (H) 30.0 - 36.0 g/dL   RDW 95.1 88.4 - 16.6 %   Platelets 141 (L) 150 - 400 K/uL    Comment: REPEATED TO VERIFY   nRBC 1.9 (H) 0.0 - 0.2 %    Comment: Performed at [facility] Lab, [address], [place], [place] [address]  Prepare RBC (crossmatch)     Status: None   Collection Time: 09/08/21 12:33 PM  Result Value Ref Range   Order Confirmation      ORDER PROCESSED BY BLOOD  BANK Performed at [facility] Lab, [address], [place], [place] [address]   Glucose, capillary     Status: Abnormal   Collection Time: 09/08/21  3:50 PM  Result Value Ref Range   Glucose-Capillary 203 (H) 70 - 99 mg/dL    Comment: Glucose reference range applies only to samples taken after fasting for at least 8 hours.  CBC     Status: Abnormal   Collection Time: 09/08/21  6:08 PM  Result Value Ref Range   WBC 28.7 (H) 4.0 - 10.5 K/uL    Comment: WHITE COUNT CONFIRMED ON SMEAR   RBC 2.30 (L) 4.22 - 5.81 MIL/uL   Hemoglobin 7.1 (L) 13.0 - 17.0 g/dL   HCT 32.3 (L) 55.7 - 32.2 %   MCV 86.1 80.0 - 100.0 fL   MCH 30.9 26.0 - 34.0 pg   MCHC 35.9 30.0 - 36.0 g/dL   RDW 02.5 42.7 - 06.2 %   Platelets 122 (L) 150 - 400 K/uL    Comment: REPEATED TO VERIFY   nRBC 1.6 (H) 0.0 - 0.2 %    Comment: Performed at [facility] Lab, [address], [place], [place] [address]  Renal function panel (daily at 1600)     Status: Abnormal   Collection Time: 09/08/21  6:08 PM  Result Value Ref Range   Sodium 137 135 - 145 mmol/L   Potassium 5.0 3.5 - 5.1 mmol/L   Chloride 110 98 - 111 mmol/L   CO2 19 (L) 22 - 32 mmol/L   Glucose, Bld 228 (H) 70 - 99 mg/dL    Comment: Glucose reference range applies only to samples taken after fasting for  at least 8 hours.   BUN 48 (H) 6 - 20 mg/dL   Creatinine, Ser 1.61 (H) 0.61 - 1.24 mg/dL   Calcium 6.0 (LL) 8.9 - 10.3 mg/dL    Comment: CRITICAL RESULT CALLED TO, READ BACK BY AND VERIFIED WITH ERIBERTO REYNA RN 09/08/21 @1925  BY J. WHITE   Phosphorus 5.1 (H) 2.5 - 4.6 mg/dL   Albumin <0.9 (L) 3.5 - 5.0 g/dL   GFR, Estimated 24 (L) >60 mL/min    Comment: (NOTE) Calculated using the CKD-EPI Creatinine Equation (2021)    Anion gap 8 5 - 15    Comment: Performed at [facility] Lab, [address] [place], [place] [address]  Magnesium     Status: None   Collection Time: 09/08/21  6:08 PM  Result Value Ref Range   Magnesium 1.9 1.7 - 2.4 mg/dL     Comment: Performed at [facility] Lab, [address], [place], [place] [address]  Glucose, capillary     Status: Abnormal   Collection Time: 09/08/21  7:07 PM  Result Value Ref Range   Glucose-Capillary 203 (H) 70 - 99 mg/dL    Comment: Glucose reference range applies only to samples taken after fasting for at least 8 hours.  I-STAT 7, (LYTES, BLD GAS, ICA, H+H)     Status: Abnormal   Collection Time: 09/08/21  7:07 PM  Result Value Ref Range   pH, Arterial 7.353 7.35 - 7.45   pCO2 arterial 36.1 32 - 48 mmHg   pO2, Arterial 123 (H) 83 - 108 mmHg   Bicarbonate 19.9 (L) 20.0 - 28.0 mmol/L   TCO2 21 (L) 22 - 32 mmol/L   O2 Saturation 99 %   Acid-base deficit 5.0 (H) 0.0 - 2.0 mmol/L   Sodium 136 135 - 145 mmol/L   Potassium 5.0 3.5 - 5.1 mmol/L   Calcium, Ion 0.95 (L) 1.15 - 1.40 mmol/L   HCT 17.0 (L) 39.0 - 52.0 %   Hemoglobin 5.8 (LL) 13.0 - 17.0 g/dL   Patient temperature 91.4 C    Collection site art line    Drawn by Nurse    Sample type ARTERIAL    Comment NOTIFIED PHYSICIAN   Prepare RBC (crossmatch)     Status: None   Collection Time: 09/08/21  7:32 PM  Result Value Ref Range   Order Confirmation      ORDER PROCESSED BY BLOOD BANK Performed at [facility], [address], [place], [place] [address]   CBC     Status: Abnormal   Collection Time: 09/08/21 11:26 PM  Result Value Ref Range   WBC 26.2 (H) 4.0 - 10.5 K/uL   RBC 2.72 (L) 4.22 - 5.81 MIL/uL   Hemoglobin 8.1 (L) 13.0 - 17.0 g/dL   HCT 62.1 (L) 30.8 - 65.7 %   MCV 82.7 80.0 - 100.0 fL   MCH 29.8 26.0 - 34.0 pg   MCHC 36.0 30.0 - 36.0 g/dL   RDW 84.6 (H) 96.2 - 95.2 %   Platelets 107 (L) 150 - 400 K/uL   nRBC 1.4 (H) 0.0 - 0.2 %    Comment: Performed at [facility] Lab, [address], [place], [place] [address]  Glucose, capillary     Status: Abnormal   Collection Time: 09/08/21 11:29 PM  Result Value Ref Range   Glucose-Capillary 188 (H) 70 - 99 mg/dL    Comment: Glucose reference range  applies only to samples taken after fasting for  at least 8 hours.  CBC     Status: Abnormal   Collection Time: 09/09/21  4:26 AM  Result Value Ref Range   WBC 25.4 (H) 4.0 - 10.5 K/uL   RBC 2.56 (L) 4.22 - 5.81 MIL/uL   Hemoglobin 7.5 (L) 13.0 - 17.0 g/dL   HCT 78.2 (L) 95.6 - 21.3 %   MCV 83.6 80.0 - 100.0 fL   MCH 29.3 26.0 - 34.0 pg   MCHC 35.0 30.0 - 36.0 g/dL   RDW 08.6 (H) 57.8 - 46.9 %   Platelets 107 (L) 150 - 400 K/uL    Comment: REPEATED TO VERIFY   nRBC 1.3 (H) 0.0 - 0.2 %    Comment: Performed at [facility] Lab, [address], [place], [place] [address]  Renal function panel (daily at 0500)     Status: Abnormal   Collection Time: 09/09/21  4:26 AM  Result Value Ref Range   Sodium 139 135 - 145 mmol/L   Potassium 4.7 3.5 - 5.1 mmol/L   Chloride 106 98 - 111 mmol/L   CO2 22 22 - 32 mmol/L   Glucose, Bld 208 (H) 70 - 99 mg/dL    Comment: Glucose reference range applies only to samples taken after fasting for at least 8 hours.   BUN 38 (H) 6 - 20 mg/dL   Creatinine, Ser 8.41 (H) 0.61 - 1.24 mg/dL   Calcium 6.6 (L) 8.9 - 10.3 mg/dL   Phosphorus 4.3 2.5 - 4.6 mg/dL   Albumin <3.2 (L) 3.5 - 5.0 g/dL   GFR, Estimated 36 (L) >60 mL/min    Comment: (NOTE) Calculated using the CKD-EPI Creatinine Equation (2021)    Anion gap 11 5 - 15    Comment: Performed at [facility] Lab, [address] [place], [place] [address]  Magnesium     Status: None   Collection Time: 09/09/21  4:26 AM  Result Value Ref Range   Magnesium 1.9 1.7 - 2.4 mg/dL    Comment: Performed at [facility], [address], [place], [place] [address]  Glucose, capillary     Status: Abnormal   Collection Time: 09/09/21  4:30 AM  Result Value Ref Range   Glucose-Capillary 192 (H) 70 - 99 mg/dL    Comment: Glucose reference range applies only to samples taken after fasting for at least 8 hours.  Glucose, capillary     Status: Abnormal   Collection Time: 09/09/21  7:20 AM  Result Value Ref  Range   Glucose-Capillary 164 (H) 70 - 99 mg/dL    Comment: Glucose reference range applies only to samples taken after fasting for at least 8 hours.  CBC     Status: Abnormal   Collection Time: 09/09/21 10:01 AM  Result Value Ref Range   WBC 24.6 (H) 4.0 - 10.5 K/uL   RBC 2.40 (L) 4.22 - 5.81 MIL/uL   Hemoglobin 7.1 (L) 13.0 - 17.0 g/dL   HCT 66.4 (L) 40.3 - 47.4 %   MCV 82.5 80.0 - 100.0 fL   MCH 29.6 26.0 - 34.0 pg   MCHC 35.9 30.0 - 36.0 g/dL   RDW 25.9 (H) 56.3 - 87.5 %   Platelets 101 (L) 150 - 400 K/uL    Comment: Immature Platelet Fraction may be clinically indicated, consider ordering this additional test [id number] REPEATED TO VERIFY    nRBC 1.5 (H) 0.0 - 0.2 %    Comment: Performed at [facility] Lab, [address] [place], [place] [address]  Glucose, capillary     Status: Abnormal   Collection Time: 09/09/21 11:24 AM  Result Value Ref Range   Glucose-Capillary 163 (H) 70 - 99 mg/dL    Comment: Glucose reference range applies only to samples taken after fasting for at least 8 hours.  Glucose, capillary     Status: Abnormal   Collection Time: 09/09/21  3:02 PM  Result Value Ref Range   Glucose-Capillary 182 (H) 70 - 99 mg/dL    Comment: Glucose reference range applies only to samples taken after fasting for at least 8 hours.  CBC     Status: Abnormal   Collection Time: 09/09/21  5:38 PM  Result Value Ref Range   WBC 16.7 (H) 4.0 - 10.5 K/uL   RBC 1.97 (L) 4.22 - 5.81 MIL/uL   Hemoglobin 5.8 (LL) 13.0 - 17.0 g/dL    Comment: REPEATED TO VERIFY THIS CRITICAL RESULT HAS VERIFIED AND BEEN CALLED TO D.OVALNADER RN BY KATHERINE MCCORMICK ON 09 05 2023 AT 1811, AND HAS BEEN READ BACK.     HCT 16.5 (L) 39.0 - 52.0 %   MCV 83.8 80.0 - 100.0 fL   MCH 29.4 26.0 - 34.0 pg   MCHC 35.2 30.0 - 36.0 g/dL   RDW 21.3 (H) 08.6 - 57.8 %   Platelets 73 (L) 150 - 400 K/uL    Comment: Immature Platelet Fraction may be clinically indicated, consider ordering this additional  test [id number] REPEATED TO VERIFY    nRBC 1.7 (H) 0.0 - 0.2 %    Comment: Performed at [facility], [address] [place], [place] [address]  Renal function panel (daily at 1600)     Status: Abnormal   Collection Time: 09/09/21  5:38 PM  Result Value Ref Range   Sodium 139 135 - 145 mmol/L   Potassium 4.3 3.5 - 5.1 mmol/L   Chloride 110 98 - 111 mmol/L   CO2 24 22 - 32 mmol/L   Glucose, Bld 234 (H) 70 - 99 mg/dL    Comment: Glucose reference range applies only to samples taken after fasting for at least 8 hours.   BUN 39 (H) 6 - 20 mg/dL   Creatinine, Ser 4.40 (H) 0.61 - 1.24 mg/dL    Comment: DELTA CHECK NOTED   Calcium 6.5 (L) 8.9 - 10.3 mg/dL   Phosphorus 3.2 2.5 - 4.6 mg/dL   Albumin <1.0 (L) 3.5 - 5.0 g/dL   GFR, Estimated 48 (L) >60 mL/min    Comment: (NOTE) Calculated using the CKD-EPI Creatinine Equation (2021)    Anion gap 5 5 - 15    Comment: Performed at [facility] Lab, [address] [place], [place] [address]  Prepare RBC (crossmatch)     Status: None   Collection Time: 09/09/21  6:13 PM  Result Value Ref Range   Order Confirmation      ORDER PROCESSED BY BLOOD BANK Performed at [facility], [address], [place], [place] [address]   Glucose, capillary     Status: Abnormal   Collection Time: 09/09/21  7:27 PM  Result Value Ref Range   Glucose-Capillary 237 (H) 70 - 99 mg/dL    Comment: Glucose reference range applies only to samples taken after fasting for at least 8 hours.  CBC     Status: Abnormal   Collection Time: 09/09/21  9:12 PM  Result Value Ref Range   WBC 15.6 (H) 4.0 -  10.5 K/uL   RBC 2.17 (L) 4.22 - 5.81 MIL/uL   Hemoglobin 6.4 (LL) 13.0 - 17.0 g/dL    Comment: REPEATED TO VERIFY THIS CRITICAL RESULT HAS VERIFIED AND BEEN CALLED TO RN TOLER MICHELLE BY PAMELA HENDERSON ON 09 05 2023 AT 2132, AND HAS BEEN READ BACK.     HCT 18.2 (L) 39.0 - 52.0 %   MCV 83.9 80.0 - 100.0 fL   MCH 29.5 26.0 - 34.0 pg   MCHC 35.2 30.0 -  36.0 g/dL   RDW 16.1 (H) 09.6 - 04.5 %   Platelets 76 (L) 150 - 400 K/uL    Comment: Immature Platelet Fraction may be clinically indicated, consider ordering this additional test [id number]    nRBC 1.9 (H) 0.0 - 0.2 %    Comment: Performed at [facility] Lab, [address], [place], [place] [address]  Prepare RBC (crossmatch)     Status: None   Collection Time: 09/09/21  9:47 PM  Result Value Ref Range   Order Confirmation      ORDER PROCESSED BY BLOOD BANK Performed at [facility] Lab, [address], [place], [place] [address]   Glucose, capillary     Status: Abnormal   Collection Time: 09/09/21 11:03 PM  Result Value Ref Range   Glucose-Capillary 242 (H) 70 - 99 mg/dL    Comment: Glucose reference range applies only to samples taken after fasting for at least 8 hours.  Lactic acid, plasma     Status: None   Collection Time: 09/09/21 11:51 PM  Result Value Ref Range   Lactic Acid, Venous 1.1 0.5 - 1.9 mmol/L    Comment: Performed at [facility], [address], [place], [place] [address]  Hemoglobin and hematocrit, blood     Status: Abnormal   Collection Time: 09/09/21 11:51 PM  Result Value Ref Range   Hemoglobin 7.3 (L) 13.0 - 17.0 g/dL   HCT 84.6 (L) 96.2 - 95.2 %    Comment: Performed at [facility], [address] [place], [place] [address]  Type and screen [facility]     Status: None (Preliminary result)   Collection Time: 09/09/21 11:51 PM  Result Value Ref Range   ABO/RH(D) A NEG    Antibody Screen NEG    Sample Expiration      09/12/2021,2359 Performed at [facility], [address] [place], [place] [address]    Unit Number [id number]    Blood Component Type RED CELLS,LR    Unit division 00    Status of Unit ISSUED    Transfusion Status OK TO TRANSFUSE    Crossmatch Result Compatible    Unit Number [id number]    Blood Component Type RED CELLS,LR    Unit division 00    Status of Unit ALLOCATED     Transfusion Status OK TO TRANSFUSE    Crossmatch Result Compatible   CBC     Status: Abnormal   Collection Time: 09/10/21  1:59 AM  Result Value Ref Range   WBC 18.6 (H) 4.0 - 10.5 K/uL   RBC 2.50 (L) 4.22 - 5.81 MIL/uL   Hemoglobin 7.1 (L) 13.0 - 17.0 g/dL   HCT 32.9 (L) 51.8 - 84.1 %   MCV 81.6 80.0 - 100.0 fL   MCH 28.4 26.0 - 34.0 pg   MCHC 34.8 30.0 - 36.0 g/dL   RDW 66.0 (H) 63.0 - 16.0 %   Platelets 66 (L)  150 - 400 K/uL    Comment: REPEATED TO VERIFY   nRBC 1.7 (H) 0.0 - 0.2 %    Comment: Performed at [facility] Lab, [address], [place], [place] [address]  Renal function panel (daily at 0500)     Status: Abnormal   Collection Time: 09/10/21  1:59 AM  Result Value Ref Range   Sodium 138 135 - 145 mmol/L   Potassium 4.2 3.5 - 5.1 mmol/L   Chloride 108 98 - 111 mmol/L   CO2 25 22 - 32 mmol/L   Glucose, Bld 295 (H) 70 - 99 mg/dL    Comment: Glucose reference range applies only to samples taken after fasting for at least 8 hours.   BUN 36 (H) 6 - 20 mg/dL   Creatinine, Ser 5.00 (H) 0.61 - 1.24 mg/dL   Calcium 6.7 (L) 8.9 - 10.3 mg/dL   Phosphorus 2.3 (L) 2.5 - 4.6 mg/dL   Albumin <9.3 (L) 3.5 - 5.0 g/dL   GFR, Estimated >81 >82 mL/min    Comment: (NOTE) Calculated using the CKD-EPI Creatinine Equation (2021)    Anion gap 5 5 - 15    Comment: Performed at [facility] Lab, [address], [place], [place] [address]  Magnesium     Status: None   Collection Time: 09/10/21  1:59 AM  Result Value Ref Range   Magnesium 2.4 1.7 - 2.4 mg/dL    Comment: Performed at [facility] Lab, [address], [place], [place] [address]  Glucose, capillary     Status: Abnormal   Collection Time: 09/10/21  3:34 AM  Result Value Ref Range   Glucose-Capillary 294 (H) 70 - 99 mg/dL    Comment: Glucose reference range applies only to samples taken after fasting for at least 8 hours.  Technologist smear review     Status: None   Collection Time: 09/10/21  5:20 AM  Result Value  Ref Range   WBC MORPHOLOGY MILD LEFT SHIFT (1-5% METAS, OCC MYELO, OCC BANDS)    RBC MORPHOLOGY SMUDGE CELLS     Comment: POLYCHROMASIA PRESENT   Plt Morphology Normal platelet morphology    Clinical Information thrombocytopenia     Comment: Performed at [facility] Lab, [address] [place], [place] [address]  CBC with Differential/Platelet     Status: Abnormal   Collection Time: 09/10/21  5:20 AM  Result Value Ref Range   WBC 16.9 (H) 4.0 - 10.5 K/uL   RBC 2.41 (L) 4.22 - 5.81 MIL/uL   Hemoglobin 7.0 (L) 13.0 - 17.0 g/dL   HCT 17.5 (L) 10.2 - 58.5 %   MCV 82.6 80.0 - 100.0 fL   MCH 29.0 26.0 - 34.0 pg   MCHC 35.2 30.0 - 36.0 g/dL   RDW 27.7 (H) 82.4 - 23.5 %   Platelets 66 (L) 150 - 400 K/uL    Comment: Immature Platelet Fraction may be clinically indicated, consider ordering this additional test [id number] REPEATED TO VERIFY    nRBC 1.6 (H) 0.0 - 0.2 %   Neutrophils Relative % 89 %   Neutro Abs 15.1 (H) 1.7 - 7.7 K/uL   Lymphocytes Relative 3 %   Lymphs Abs 0.4 (L) 0.7 - 4.0 K/uL   Monocytes Relative 4 %   Monocytes Absolute 0.8 0.1 - 1.0 K/uL   Eosinophils Relative 0 %   Eosinophils Absolute 0.0 0.0 - 0.5 K/uL   Basophils Relative 0 %   Basophils Absolute 0.0 0.0 -  0.1 K/uL   WBC Morphology MILD LEFT SHIFT (1-5% METAS, OCC MYELO, OCC BANDS)    RBC Morphology See Note    Smear Review Normal platelet morphology    Immature Granulocytes 4 %   Abs Immature Granulocytes 0.62 (H) 0.00 - 0.07 K/uL   Smudge Cells PRESENT    Polychromasia PRESENT     Comment: Performed at [facility] Lab, [address] [place], [place] [address]  CBC     Status: Abnormal   Collection Time: 09/10/21  5:22 AM  Result Value Ref Range   WBC 16.7 (H) 4.0 - 10.5 K/uL   RBC 2.44 (L) 4.22 - 5.81 MIL/uL   Hemoglobin 6.8 (LL) 13.0 - 17.0 g/dL    Comment: REPEATED TO VERIFY THIS CRITICAL RESULT HAS VERIFIED AND BEEN CALLED TO HAILEY VERMILLION RN BY PAMELA HENDERSON ON 09 06 2023 AT 0601,  AND HAS BEEN READ BACK.     HCT 20.3 (L) 39.0 - 52.0 %   MCV 83.2 80.0 - 100.0 fL   MCH 27.9 26.0 - 34.0 pg   MCHC 33.5 30.0 - 36.0 g/dL   RDW 62.9 (H) 47.6 - 54.6 %   Platelets 67 (L) 150 - 400 K/uL    Comment: REPEATED TO VERIFY   nRBC 1.6 (H) 0.0 - 0.2 %    Comment: Performed at [facility] Lab, [address] [address], [place], [place] [address]  Urinalysis, Routine w reflex microscopic Urine, Catheterized     Status: Abnormal   Collection Time: 09/10/21  5:32 AM  Result Value Ref Range   Color, Urine AMBER (A) YELLOW    Comment: BIOCHEMICALS MAY BE AFFECTED BY COLOR   APPearance HAZY (A) CLEAR   Specific Gravity, Urine 1.024 1.005 - 1.030   pH 5.0 5.0 - 8.0   Glucose, UA >=500 (A) NEGATIVE mg/dL   Hgb urine dipstick NEGATIVE NEGATIVE   Bilirubin Urine NEGATIVE NEGATIVE   Ketones, ur NEGATIVE NEGATIVE mg/dL   Protein, ur 30 (A) NEGATIVE mg/dL   Nitrite NEGATIVE NEGATIVE   Leukocytes,Ua NEGATIVE NEGATIVE   RBC / HPF 0-5 0 - 5 RBC/hpf   WBC, UA 0-5 0 - 5 WBC/hpf   Bacteria, UA RARE (A) NONE SEEN   Squamous Epithelial / LPF 0-5 0 - 5   Mucus PRESENT     Comment: Performed at [facility], [address], [place], [place] [address]  Prepare RBC (crossmatch)     Status: None   Collection Time: 09/10/21  6:23 AM  Result Value Ref Range   Order Confirmation      ORDER PROCESSED BY BLOOD BANK Performed at [facility], [address], [place], [place] [address]   Glucose, capillary     Status: Abnormal   Collection Time: 09/10/21  7:15 AM  Result Value Ref Range   Glucose-Capillary 303 (H) 70 - 99 mg/dL    Comment: Glucose reference range applies only to samples taken after fasting for at least 8 hours.  Hemoglobin and hematocrit, blood     Status: Abnormal   Collection Time: 09/10/21 10:47 AM  Result Value Ref Range   Hemoglobin 5.3 (LL) 13.0 - 17.0 g/dL    Comment: CRITICAL VALUE NOTED.  VALUE IS CONSISTENT WITH PREVIOUSLY REPORTED AND CALLED VALUE. REPEATED TO  VERIFY    HCT 15.9 (L) 39.0 - 52.0 %    Comment: Performed at [facility], [address] [place], [place] [address]  Glucose, capillary  Status: Abnormal   Collection Time: 09/10/21 11:10 AM  Result Value Ref Range   Glucose-Capillary 135 (H) 70 - 99 mg/dL    Comment: Glucose reference range applies only to samples taken after fasting for at least 8 hours.  Prepare RBC (crossmatch)     Status: None   Collection Time: 09/10/21 11:27 AM  Result Value Ref Range   Order Confirmation      ORDER PROCESSED BY BLOOD BANK Performed at [facility], [address] [place], [place] [address]    ECHOCARDIOGRAM COMPLETE  Result Date: 09/09/2021    ECHOCARDIOGRAM REPORT   Patient Name:   JACQUES FIFE Date of Exam: 09/09/2021 Medical Rec #:  [id number]       Height:       72.0 in Accession #:    [id number]      Weight:       344.1 lb Date of Birth:  January 27, 1960       BSA:          2.683 m Patient Age:    60 years        BP:           120/78 mmHg Patient Gender: M               HR:           95 bpm. Exam Location:  Inpatient Procedure: 2D Echo, Cardiac Doppler and Color Doppler Indications:    Acute respiratory distress  History:        Patient has prior history of Echocardiogram examinations, most                 recent 04/29/2021. Risk Factors:Hypertension, Diabetes and                 Dyslipidemia.  Sonographer:    Eduard Roux Referring Phys: [phone] MURALI RAMASWAMY  Sonographer Comments: Unable to use Definity due to no IV access. IMPRESSIONS  1. Left ventricular ejection fraction, by estimation, is 65 to 70%. The left ventricle has hyperdynamic function. The left ventricle has no regional wall motion abnormalities. Left ventricular diastolic parameters are indeterminate.  2. Right ventricular systolic function is normal. The right ventricular size is normal. Tricuspid regurgitation signal is inadequate for assessing PA pressure.  3. The mitral valve is normal in structure. No evidence of  mitral valve regurgitation.  4. The aortic valve is tricuspid. There is mild calcification of the aortic valve. There is mild thickening of the aortic valve. Aortic valve regurgitation is not visualized. Aortic valve sclerosis is present, with no evidence of aortic valve stenosis. Comparison(s): No significant change from prior study. Prior images reviewed side by side. FINDINGS  Left Ventricle: Left ventricular ejection fraction, by estimation, is 65 to 70%. The left ventricle has hyperdynamic function. The left ventricle has no regional wall motion abnormalities. The left ventricular internal cavity size was normal in size. Suboptimal image quality limits for assessment of left ventricular hypertrophy. Left ventricular diastolic parameters are indeterminate. Right Ventricle: The right ventricular size is normal. Right vetricular wall thickness was not well visualized. Right ventricular systolic function is normal. Tricuspid regurgitation signal is inadequate for assessing PA pressure. Left Atrium: Left atrial size was normal in size. Right Atrium: Right atrial size was normal in size. Pericardium: There is no evidence of pericardial effusion. Mitral Valve: The mitral valve is normal in structure. No evidence of mitral valve regurgitation. Tricuspid Valve: The tricuspid valve is normal in structure.  Tricuspid valve regurgitation is not demonstrated. Aortic Valve: The aortic valve is tricuspid. There is mild calcification of the aortic valve. There is mild thickening of the aortic valve. Aortic valve regurgitation is not visualized. Aortic valve sclerosis is present, with no evidence of aortic valve stenosis. Aortic valve peak gradient measures 13.0 mmHg. Pulmonic Valve: The pulmonic valve was not well visualized. Aorta: The aortic root was not well visualized. Venous: The inferior vena cava was not well visualized. IAS/Shunts: The interatrial septum was not well visualized.   Diastology LV e' medial:    4.73 cm/s  LV E/e' medial:  12.7 LV e' lateral:   5.40 cm/s LV E/e' lateral: 11.1  LEFT ATRIUM             Index        RIGHT ATRIUM           Index LA Vol (A2C):   32.9 ml 12.26 ml/m  RA Area:     12.70 cm LA Vol (A4C):   35.4 ml 13.19 ml/m  RA Volume:   24.50 ml  9.13 ml/m LA Biplane Vol: 35.5 ml 13.23 ml/m  AORTIC VALVE              PULMONIC VALVE AV Vmax:      180.50 cm/s PV Vmax:       0.84 m/s AV Peak Grad: 13.0 mmHg   PV Peak grad:  2.8 mmHg LVOT Vmax:    142.00 cm/s LVOT Vmean:   95.200 cm/s LVOT VTI:     0.226 m MITRAL VALVE               TRICUSPID VALVE MV Area (PHT): 3.72 cm    TR Peak grad:   8.8 mmHg MV Decel Time: 204 msec    TR Vmax:        148.00 cm/s MV E velocity: 60.00 cm/s MV A velocity: 53.60 cm/s  SHUNTS MV E/A ratio:  1.12        Systemic VTI: 0.23 m Rachelle Hora Croitoru MD Electronically signed by Thurmon Fair MD Signature Date/Time: 09/09/2021/11:54:05 AM    Final    DG CHEST PORT 1 VIEW  Result Date: 09/08/2021 CLINICAL DATA:  Central line placement. EXAM: PORTABLE CHEST 1 VIEW COMPARISON:  September 07, 2021. FINDINGS: Stable cardiomediastinal silhouette. Endotracheal and nasogastric tubes are in grossly good position. Interval placement of right internal jugular catheter with distal tip in expected position of cavoatrial junction. Mild bibasilar subsegmental atelectasis is noted with small pleural effusions. IMPRESSION: Interval placement of right internal jugular catheter with distal tip in expected position of cavoatrial junction. No pneumothorax is noted. Hypoinflation of the lungs with mild bibasilar subsegmental atelectasis and small pleural effusions. Electronically Signed   By: Lupita Raider M.D.   On: 09/08/2021 14:34      Small capsule endoscopy  Study was suboptimal as capsule did not reach the cecum and visualization was limited due to extensive blood in the lumen. The capsule was deployed in the duodenum via EGD.  There was presence of extensive amount of fresh blood and clots  in the mid small bowel starting at 02:47:52 until 03:31:10. At that point, the blood was darker. No lesions could be identified which could pinpoint to an etiology of the bleeding.  Anti-infectives (From admission, onward)    Start     Dose/Rate Route Frequency Ordered Stop   09/08/21 0200  piperacillin-tazobactam (ZOSYN) IVPB 3.375 g  Status:  Discontinued  3.375 g 12.5 mL/hr over 240 Minutes Intravenous Every 8 hours 09/07/21 1856 09/07/21 2114   09/07/21 2200  meropenem (MERREM) 1 g in sodium chloride 0.9 % 100 mL IVPB        1 g 200 mL/hr over 30 Minutes Intravenous Every 8 hours 09/07/21 2114     09/07/21 1945  piperacillin-tazobactam (ZOSYN) IVPB 3.375 g        3.375 g 100 mL/hr over 30 Minutes Intravenous  Once 09/07/21 1856 09/07/21 2005   09/07/21 1753  cefTRIAXone (ROCEPHIN) injection         Intravenous As needed 09/07/21 1754 09/07/21 1753   09/07/21 1749  sodium chloride 0.9 % with cefTRIAXone (ROCEPHIN) ADS Med       Note to Pharmacy: Harlow Asa E: cabinet override      09/07/21 1749 09/07/21 1935       Assessment/Plan GI Bleed ABL Anemia Patient seen and examined.  Vitals, labs, I/O, imaging and available notes reviewed.  This is a 61 year old male with who is admitted on 8/31 with GI bleed.  Since then he has undergone multiple imaging studies and procedures.  CTA imaging revealed likely jejunal source of the bleeding appearing to be a diverticulum. Small bowel endoscopy by Dr. Levon Hedger on 9/2 showed a portion of the proximal jejunum with evidence of an area that had intermittent blood pumping that was not able to be endoscopy controlled but was tattooed for marking purposes. He then underwent IR mesenteric arteriography, coil emobolization of SMA territory, jejunal arcade branch on 9/2. CTA/post mesenteric arteriogram 9/3 with was negative for any ongoing bleeding. Patient now with continued maroon stools and ABL anemia requiring multiple units of PRBC without  appropriate response in hgb.  We are consulted to consider surgery. Primary is also touching base with IR and GI. At this time patient be very high risk for any procedure given his acute illness (on the vent, still on pressors and on CRRT). After discussing case with my attending would recommend repeat CTA and continued PRBC replacement/trend hgb. We will follow up on results with further recommendations to follow. I discussed recommendations with CCM. Updated family at bedside.   Cholecystitis - Noted on imaging 9/3 and IR placed perc chole drain on same date - Cx's with E. Coli. On abx  FEN - Please hold tube feeds. IVF per CCM/Neprho VTE - SCDs, on hold for anemia ID - Merrem Foley - in place, strict I/O  -Per CCM- Acute Resp Failure on Vent  Shock on pressors AKI on CRRT Hx HTN Hx HLD Dm2 Hx CVA on Plavix prior to admission  Jacinto Halim, [facility] Surgery 09/10/2021, 11:44 AM Please see Amion for pager number during day hours 7:00am-4:30pm

## 2021-09-10 NOTE — Progress Notes (Signed)
Referring Physician(s): Dr. Concepcion Living  Supervising Physician: Corrie Mckusick  Patient Status:  [facility] - In-pt  Chief Complaint: Melena  Subjective: Remains intubated.  Remains on pressors, although decreased since starting CRRT. Last episode of melena this AM, 930.  Perc chole in place without issue.  Family at bedside.   Allergies: Metformin and related and Penicillins  Medications: Prior to Admission medications   Medication Sig Start Date End Date Taking? Authorizing Provider  amLODipine (NORVASC) 10 MG tablet Take 1 tablet (10 mg total) by mouth daily. 06/15/17  Yes Caren Macadam, MD  aspirin EC 81 MG tablet Take 81 mg by mouth daily. Swallow whole.   Yes [provider]  clopidogrel (PLAVIX) 75 MG tablet Take 1 tablet (75 mg total) by mouth daily. 04/30/21  Yes Little Ishikawa, MD  glipiZIDE (GLUCOTROL XL) 10 MG 24 hr tablet Take 1 tablet (10 mg total) by mouth daily with breakfast. 04/16/17  Yes Hagler, Apolonio Schneiders, MD  levocetirizine (XYZAL) 5 MG tablet Take 1 tablet by mouth at bedtime.   Yes [provider]  lisinopril (PRINIVIL,ZESTRIL) 40 MG tablet Take 1 tablet (40 mg total) by mouth daily. 05/12/17  Yes Caren Macadam, MD  metFORMIN (GLUCOPHAGE-XR) 500 MG 24 hr tablet Take 500-1,000 mg by mouth in the morning and at bedtime. 02/09/21  Yes [provider]  rosuvastatin (CRESTOR) 40 MG tablet Take 1 tablet (40 mg total) by mouth daily. 04/29/21  Yes Little Ishikawa, MD  tamsulosin (FLOMAX) 0.4 MG CAPS capsule Take 0.4 mg by mouth daily. 02/12/21  Yes [provider]  traZODone (DESYREL) 150 MG tablet Take 150 mg by mouth at bedtime. 02/04/21  Yes [provider]  TRESIBA FLEXTOUCH 200 UNIT/ML FlexTouch Pen Inject 20 Units into the skin in the morning and at bedtime. 03/14/21  Yes [provider]  BD VEO INSULIN SYRINGE U/F 31G X 15/64" 1 ML MISC  USE AS DIRECTED 06/08/17   Caren Macadam, MD  glucose blood (ONETOUCH VERIO)  test strip TEST twice a day 05/04/17   Caren Macadam, MD  Insulin Pen Needle (NOVOTWIST) 32G X 5 MM MISC Use two daily to inject Victoza and Toujeo. 04/25/15   Elayne Snare, MD  [facility] DELICA LANCETS 01U MISC Use to check blood sugar once a day dx code E11.65 11/21/14   Elayne Snare, MD     Vital Signs: BP 135/63   Pulse 72   Temp 97.7 F (36.5 C) (Esophageal)   Resp 18   Ht 6' (1.829 m)   Wt (!) 343 lb 7.6 oz (155.8 kg)   SpO2 100%   BMI 46.58 kg/m   Physical Exam NAD, alert Abdomen: Distended. RUQ drain in place.  Bilious output in gravity bag.  Site intact, suture intact. Flushes and aspirates easily.   Imaging: IR EMBO ART  VEN HEMORR LYMPH EXTRAV  INC GUIDE ROADMAPPING  Result Date: 09/09/2021 INDICATION: GI bleed. Briefly, a 61 year old male with hematochezia. Push enteroscopy revealing fresh blood within jejunum. 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 Patient transferred from [facility] for definitive management. EXAM: Procedures: 1. CELIAC and SUPERIOR MESENTERIC ARTERIOGRAPHY 2. COIL EMBOLIZATION of SMA TERRITORY, JEJUNAL ARCADE BRANCH COMPARISON:  CTA AP, earlier same day. MEDICATIONS: 50 mg Benadryl IV.  4 mg Zofran. ANESTHESIA/SEDATION: Moderate (conscious) sedation was employed during this procedure. A total of Versed 1.5 mg and Fentanyl 75 mcg was administered intravenously. Moderate Sedation Time: 80 minutes. The patient's level of consciousness and vital signs  were monitored continuously by radiology nursing throughout the procedure under my direct supervision. CONTRAST:  75 mL Isovue-300. FLUOROSCOPY TIME:  Fluoroscopic dose; 0454 mGy COMPLICATIONS: None immediate. PROCEDURE: Informed consent was obtained from the patient and/or patient's representative following explanation of the procedure, risks, benefits and alternatives. All questions were addressed. A time out was performed prior to the initiation of the procedure. Maximal barrier sterile  technique utilized including caps, mask, sterile gowns, sterile gloves, large sterile drape, hand hygiene, and chlorhexidine prep. At the RIGHT groin 1% lidocaine was used for local anesthesia. Limited ultrasound imaging of the groin shows the RIGHT femoral artery to be patent. An ultrasound image was saved and sent to PACS. Arterial access was obtained with a 21-G, 7-cm needle under direct ultrasound guidance, through which a 0.018-inch guidewire was advanced under fluoroscopy. The 21-G needle was then exchanged for a micropuncture catheter and a 0.035-inch Bentson guidewire. The micropuncture catheter was exchanged for a 5-F sheath. A 5 Fr cobra 2 catheter was used to catheterize the celiac axis then SMA through which a digital subtraction angiogram (DSA) was obtained. No intervention was performed at the celiac axis. The SMA was cannulated with the C2 catheter and glidewire, then using a STC microcatheter and 0.014 inch Fathom microwire access to the jejunal arcades was obtained and subselective DSA was performed. PURPOSE OF THE ARTERIOGRAM: No previous catheter-directed angiogram was available. Therefore a new complete diagnostic angiography was performed. The decision to proceed with an interventional procedure was made based on this new diagnostic angiogram. Super selective SMA territory jejunal arcade branch embolization was then performed using micro coils. A final angiogram was performed. Images were reviewed and the procedure was terminated. All wires, catheters and sheaths were removed from the patient. Hemostasis was achieved at the RIGHT groin access site with Angio-Seal closure. The patient tolerated the procedure well without immediate post procedural complication. FINDINGS: 1. Normal caliber, order and branching of the celiac axis. 2. 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 3. Active extravasation at SMA territory jejunal arcade branch at the level of the endoscopy clip,  (see key image) successfully treated with super selective coil embolization. 4. Angio-Seal closure of the RIGHT groin with distal RIGHT lower extremity pulses of the end of the case. IMPRESSION: Successful super selective coil embolization of active extravasation from SMA territory jejunal arcade branch for upper GI bleed, as above. PLAN: 1. Post sheath removal precautions, including RIGHT lower extremity straight x2 hours. 2. Serial examinations and H/h trend to evaluate potential ongoing GI bleeding. Michaelle Birks, MD [facility] Radiology Electronically Signed   By: Michaelle Birks M.D.   On: 09/09/2021 14:27   IR Angiogram Visceral Selective  Result Date: 09/09/2021 INDICATION: GI bleed. Briefly, a 61 year old male with hematochezia. Push enteroscopy revealing fresh blood within jejunum. 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 Patient transferred from [facility] for definitive management. EXAM: Procedures: 1. CELIAC and SUPERIOR MESENTERIC ARTERIOGRAPHY 2. COIL EMBOLIZATION of SMA TERRITORY, JEJUNAL ARCADE BRANCH COMPARISON:  CTA AP, earlier same day. MEDICATIONS: 50 mg Benadryl IV.  4 mg Zofran. ANESTHESIA/SEDATION: Moderate (conscious) sedation was employed during this procedure. A total of Versed 1.5 mg and Fentanyl 75 mcg was administered intravenously. Moderate Sedation Time: 80 minutes. The patient's level of consciousness and vital signs were monitored continuously by radiology nursing throughout the procedure under my direct supervision. CONTRAST:  75 mL Isovue-300. FLUOROSCOPY TIME:  Fluoroscopic dose; 0981 mGy COMPLICATIONS: None immediate. PROCEDURE:  Informed consent was obtained from the patient and/or patient's representative following explanation of the procedure, risks, benefits and alternatives. All questions were addressed. A time out was performed prior to the initiation of the procedure. Maximal barrier sterile  technique utilized including caps, mask, sterile gowns, sterile gloves, large sterile drape, hand hygiene, and chlorhexidine prep. At the RIGHT groin 1% lidocaine was used for local anesthesia. Limited ultrasound imaging of the groin shows the RIGHT femoral artery to be patent. An ultrasound image was saved and sent to PACS. Arterial access was obtained with a 21-G, 7-cm needle under direct ultrasound guidance, through which a 0.018-inch guidewire was advanced under fluoroscopy. The 21-G needle was then exchanged for a micropuncture catheter and a 0.035-inch Bentson guidewire. The micropuncture catheter was exchanged for a 5-F sheath. A 5 Fr cobra 2 catheter was used to catheterize the celiac axis then SMA through which a digital subtraction angiogram (DSA) was obtained. No intervention was performed at the celiac axis. The SMA was cannulated with the C2 catheter and glidewire, then using a STC microcatheter and 0.014 inch Fathom microwire access to the jejunal arcades was obtained and subselective DSA was performed. PURPOSE OF THE ARTERIOGRAM: No previous catheter-directed angiogram was available. Therefore a new complete diagnostic angiography was performed. The decision to proceed with an interventional procedure was made based on this new diagnostic angiogram. Super selective SMA territory jejunal arcade branch embolization was then performed using micro coils. A final angiogram was performed. Images were reviewed and the procedure was terminated. All wires, catheters and sheaths were removed from the patient. Hemostasis was achieved at the RIGHT groin access site with Angio-Seal closure. The patient tolerated the procedure well without immediate post procedural complication. FINDINGS: 1. Normal caliber, order and branching of the celiac axis. 2. 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 3. Active extravasation at SMA territory jejunal arcade branch at the level of the endoscopy clip,  (see key image) successfully treated with super selective coil embolization. 4. Angio-Seal closure of the RIGHT groin with distal RIGHT lower extremity pulses of the end of the case. IMPRESSION: Successful super selective coil embolization of active extravasation from SMA territory jejunal arcade branch for upper GI bleed, as above. PLAN: 1. Post sheath removal precautions, including RIGHT lower extremity straight x2 hours. 2. Serial examinations and H/h trend to evaluate potential ongoing GI bleeding. Michaelle Birks, MD [facility] Radiology Electronically Signed   By: Michaelle Birks M.D.   On: 09/09/2021 14:27   IR Angiogram Visceral Selective  Result Date: 09/09/2021 INDICATION: GI bleed. Briefly, a 61 year old male with hematochezia. Push enteroscopy revealing fresh blood within jejunum. 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 Patient transferred from [facility] for definitive management. EXAM: Procedures: 1. CELIAC and SUPERIOR MESENTERIC ARTERIOGRAPHY 2. COIL EMBOLIZATION of SMA TERRITORY, JEJUNAL ARCADE BRANCH COMPARISON:  CTA AP, earlier same day. MEDICATIONS: 50 mg Benadryl IV.  4 mg Zofran. ANESTHESIA/SEDATION: Moderate (conscious) sedation was employed during this procedure. A total of Versed 1.5 mg and Fentanyl 75 mcg was administered intravenously. Moderate Sedation Time: 80 minutes. The patient's level of consciousness and vital signs were monitored continuously by radiology nursing throughout the procedure under my direct supervision. CONTRAST:  75 mL Isovue-300. FLUOROSCOPY TIME:  Fluoroscopic dose; 3875 mGy COMPLICATIONS: None immediate. PROCEDURE: Informed consent was obtained from the patient and/or patient's representative following explanation of the procedure, risks, benefits and alternatives. All questions were addressed. A time out was performed prior  to the initiation of the procedure. Maximal barrier sterile  technique utilized including caps, mask, sterile gowns, sterile gloves, large sterile drape, hand hygiene, and chlorhexidine prep. At the RIGHT groin 1% lidocaine was used for local anesthesia. Limited ultrasound imaging of the groin shows the RIGHT femoral artery to be patent. An ultrasound image was saved and sent to PACS. Arterial access was obtained with a 21-G, 7-cm needle under direct ultrasound guidance, through which a 0.018-inch guidewire was advanced under fluoroscopy. The 21-G needle was then exchanged for a micropuncture catheter and a 0.035-inch Bentson guidewire. The micropuncture catheter was exchanged for a 5-F sheath. A 5 Fr cobra 2 catheter was used to catheterize the celiac axis then SMA through which a digital subtraction angiogram (DSA) was obtained. No intervention was performed at the celiac axis. The SMA was cannulated with the C2 catheter and glidewire, then using a STC microcatheter and 0.014 inch Fathom microwire access to the jejunal arcades was obtained and subselective DSA was performed. PURPOSE OF THE ARTERIOGRAM: No previous catheter-directed angiogram was available. Therefore a new complete diagnostic angiography was performed. The decision to proceed with an interventional procedure was made based on this new diagnostic angiogram. Super selective SMA territory jejunal arcade branch embolization was then performed using micro coils. A final angiogram was performed. Images were reviewed and the procedure was terminated. All wires, catheters and sheaths were removed from the patient. Hemostasis was achieved at the RIGHT groin access site with Angio-Seal closure. The patient tolerated the procedure well without immediate post procedural complication. FINDINGS: 1. Normal caliber, order and branching of the celiac axis. 2. 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 3. Active extravasation at SMA territory jejunal arcade branch at the level of the endoscopy clip,  (see key image) successfully treated with super selective coil embolization. 4. Angio-Seal closure of the RIGHT groin with distal RIGHT lower extremity pulses of the end of the case. IMPRESSION: Successful super selective coil embolization of active extravasation from SMA territory jejunal arcade branch for upper GI bleed, as above. PLAN: 1. Post sheath removal precautions, including RIGHT lower extremity straight x2 hours. 2. Serial examinations and H/h trend to evaluate potential ongoing GI bleeding. Michaelle Birks, MD [facility] Radiology Electronically Signed   By: Michaelle Birks M.D.   On: 09/09/2021 14:27   IR US Guide Vasc Access Right  Result Date: 09/09/2021 INDICATION: GI bleed. Briefly, a 61 year old male with hematochezia. Push enteroscopy revealing fresh blood within jejunum. 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 Patient transferred from [facility] for definitive management. EXAM: Procedures: 1. CELIAC and SUPERIOR MESENTERIC ARTERIOGRAPHY 2. COIL EMBOLIZATION of SMA TERRITORY, JEJUNAL ARCADE BRANCH COMPARISON:  CTA AP, earlier same day. MEDICATIONS: 50 mg Benadryl IV.  4 mg Zofran. ANESTHESIA/SEDATION: Moderate (conscious) sedation was employed during this procedure. A total of Versed 1.5 mg and Fentanyl 75 mcg was administered intravenously. Moderate Sedation Time: 80 minutes. The patient's level of consciousness and vital signs were monitored continuously by radiology nursing throughout the procedure under my direct supervision. CONTRAST:  75 mL Isovue-300. FLUOROSCOPY TIME:  Fluoroscopic dose; 0626 mGy COMPLICATIONS: None immediate. PROCEDURE: Informed consent was obtained from the patient and/or patient's representative following explanation of the procedure, risks, benefits and alternatives. All questions were addressed. A time out was performed prior to the initiation of the procedure. Maximal barrier sterile technique  utilized including caps, mask, sterile gowns, sterile gloves, large sterile drape, hand hygiene, and chlorhexidine prep.  At the RIGHT groin 1% lidocaine was used for local anesthesia. Limited ultrasound imaging of the groin shows the RIGHT femoral artery to be patent. An ultrasound image was saved and sent to PACS. Arterial access was obtained with a 21-G, 7-cm needle under direct ultrasound guidance, through which a 0.018-inch guidewire was advanced under fluoroscopy. The 21-G needle was then exchanged for a micropuncture catheter and a 0.035-inch Bentson guidewire. The micropuncture catheter was exchanged for a 5-F sheath. A 5 Fr cobra 2 catheter was used to catheterize the celiac axis then SMA through which a digital subtraction angiogram (DSA) was obtained. No intervention was performed at the celiac axis. The SMA was cannulated with the C2 catheter and glidewire, then using a STC microcatheter and 0.014 inch Fathom microwire access to the jejunal arcades was obtained and subselective DSA was performed. PURPOSE OF THE ARTERIOGRAM: No previous catheter-directed angiogram was available. Therefore a new complete diagnostic angiography was performed. The decision to proceed with an interventional procedure was made based on this new diagnostic angiogram. Super selective SMA territory jejunal arcade branch embolization was then performed using micro coils. A final angiogram was performed. Images were reviewed and the procedure was terminated. All wires, catheters and sheaths were removed from the patient. Hemostasis was achieved at the RIGHT groin access site with Angio-Seal closure. The patient tolerated the procedure well without immediate post procedural complication. FINDINGS: 1. Normal caliber, order and branching of the celiac axis. 2. 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 3. Active extravasation at SMA territory jejunal arcade branch at the level of the endoscopy clip, (see key  image) successfully treated with super selective coil embolization. 4. Angio-Seal closure of the RIGHT groin with distal RIGHT lower extremity pulses of the end of the case. IMPRESSION: Successful super selective coil embolization of active extravasation from SMA territory jejunal arcade branch for upper GI bleed, as above. PLAN: 1. Post sheath removal precautions, including RIGHT lower extremity straight x2 hours. 2. Serial examinations and H/h trend to evaluate potential ongoing GI bleeding. Michaelle Birks, MD [facility] Radiology Electronically Signed   By: Michaelle Birks M.D.   On: 09/09/2021 14:27   ECHOCARDIOGRAM COMPLETE  Result Date: 09/09/2021    ECHOCARDIOGRAM REPORT   Patient Name:   Troy Randall Date of Exam: 09/09/2021 Medical Rec #:  [id number]       Height:       72.0 in Accession #:    [id number]      Weight:       344.1 lb Date of Birth:  [birth year]       BSA:          2.683 m Patient Age:    75 years        BP:           120/78 mmHg Patient Gender: M               HR:           95 bpm. Exam Location:  Inpatient Procedure: 2D Echo, Cardiac Doppler and Color Doppler Indications:    Acute respiratory distress  History:        Patient has prior history of Echocardiogram examinations, most                 recent 04/29/2021. Risk Factors:Hypertension, Diabetes and                 Dyslipidemia.  Sonographer:  Jefferey Pica Referring Phys: [phone] MURALI RAMASWAMY  Sonographer Comments: Unable to use Definity due to no IV access. IMPRESSIONS  1. Left ventricular ejection fraction, by estimation, is 65 to 70%. The left ventricle has hyperdynamic function. The left ventricle has no regional wall motion abnormalities. Left ventricular diastolic parameters are indeterminate.  2. Right ventricular systolic function is normal. The right ventricular size is normal. Tricuspid regurgitation signal is inadequate for assessing PA pressure.  3. The mitral valve is  normal in structure. No evidence of mitral valve regurgitation.  4. The aortic valve is tricuspid. There is mild calcification of the aortic valve. There is mild thickening of the aortic valve. Aortic valve regurgitation is not visualized. Aortic valve sclerosis is present, with no evidence of aortic valve stenosis. Comparison(s): No significant change from prior study. Prior images reviewed side by side. FINDINGS  Left Ventricle: Left ventricular ejection fraction, by estimation, is 65 to 70%. The left ventricle has hyperdynamic function. The left ventricle has no regional wall motion abnormalities. The left ventricular internal cavity size was normal in size. Suboptimal image quality limits for assessment of left ventricular hypertrophy. Left ventricular diastolic parameters are indeterminate. Right Ventricle: The right ventricular size is normal. Right vetricular wall thickness was not well visualized. Right ventricular systolic function is normal. Tricuspid regurgitation signal is inadequate for assessing PA pressure. Left Atrium: Left atrial size was normal in size. Right Atrium: Right atrial size was normal in size. Pericardium: There is no evidence of pericardial effusion. Mitral Valve: The mitral valve is normal in structure. No evidence of mitral valve regurgitation. Tricuspid Valve: The tricuspid valve is normal in structure. Tricuspid valve regurgitation is not demonstrated. Aortic Valve: The aortic valve is tricuspid. There is mild calcification of the aortic valve. There is mild thickening of the aortic valve. Aortic valve regurgitation is not visualized. Aortic valve sclerosis is present, with no evidence of aortic valve stenosis. Aortic valve peak gradient measures 13.0 mmHg. Pulmonic Valve: The pulmonic valve was not well visualized. Aorta: The aortic root was not well visualized. Venous: The inferior vena cava was not well visualized. IAS/Shunts: The interatrial septum was not well visualized.    Diastology LV e' medial:    4.73 cm/s LV E/e' medial:  12.7 LV e' lateral:   5.40 cm/s LV E/e' lateral: 11.1  LEFT ATRIUM             Index        RIGHT ATRIUM           Index LA Vol (A2C):   32.9 ml 12.26 ml/m  RA Area:     12.70 cm LA Vol (A4C):   35.4 ml 13.19 ml/m  RA Volume:   24.50 ml  9.13 ml/m LA Biplane Vol: 35.5 ml 13.23 ml/m  AORTIC VALVE              PULMONIC VALVE AV Vmax:      180.50 cm/s PV Vmax:       0.84 m/s AV Peak Grad: 13.0 mmHg   PV Peak grad:  2.8 mmHg LVOT Vmax:    142.00 cm/s LVOT Vmean:   95.200 cm/s LVOT VTI:     0.226 m MITRAL VALVE               TRICUSPID VALVE MV Area (PHT): 3.72 cm    TR Peak grad:   8.8 mmHg MV Decel Time: 204 msec    TR Vmax:        148.00  cm/s MV E velocity: 60.00 cm/s MV A velocity: 53.60 cm/s  SHUNTS MV E/A ratio:  1.12        Systemic VTI: 0.23 m Mihai Croitoru MD Electronically signed by Sanda Klein MD Signature Date/Time: 09/09/2021/11:54:05 AM    Final    IR Angiogram Visceral Selective  Result Date: 09/08/2021 INDICATION: History of GI bleeding within the proximal jejunum secondary to presumed small bowel diverticuli, post attempted endoscopic clipping, mesenteric arteriogram and percutaneous coil embolization of the distal arcade supplying the small bowel marked by the endoscopy clip. Unfortunately, the patient again was found to be hemodynamically unstable today with concern for recurrent bleeding though preceding abdominal CT performed 09/07/2021 was negative for recurrent GI bleeding. As the area of the endoscopy clip and embolization coils does NOT appear to correlate with the area of bleeding demonstrated on preceding abdominal CT performed 09/06/2021 or proceed with repeat mesenteric arteriogram and potential embolization. Additionally, preceding abdominal CT demonstrates air, wall thickening and fluid surrounding the gallbladder worrisome for concomitant acute cholecystitis, supported by patient's elevated white blood cell count, and as such  will also proceed with image guided placement of a cholecystostomy tube. EXAM: 1. ULTRASOUND GUIDANCE FOR ARTERIAL ACCESS 2. SELECTIVE SUPERIOR MESENTERIC ARTERIOGRAM 3. SELECTIVE ARTERIOGRAM OF TWO LEFT-SIDED JEJUNAL BRANCHES OF THE SMA INCLUDING SUB SELECTIVE ARTERIOGRAMS OF THEIR DISTAL ARCADE BRANCHES. 4. ULTRASOUND AND FLUOROSCOPIC GUIDED CHOLECYSTOSTOMY TUBE PLACEMENT COMPARISON:  Mesenteric arteriogram and percutaneous coil embolization-09/06/2021 CTA abdomen pelvis-09/07/2021; 09/06/2021; 09/05/2021 MEDICATIONS: Rocephin 2 g IV; the antibiotics were administered with an appropriate time frame prior to the initiation of the procedure. ANESTHESIA/SEDATION: Moderate (conscious) sedation was employed during this procedure. A total of Versed 4 mg and Fentanyl 200 mcg was administered intravenously. Moderate Sedation Time: 70 minutes. The patient's level of consciousness and vital signs were monitored continuously by radiology nursing throughout the procedure under my direct supervision. CONTRAST:  70 cc Omnipaque 300 FLUOROSCOPY TIME:  16 minutes, 12 seconds (7,902 mGy) COMPLICATIONS: None immediate. PROCEDURE: Informed consent was obtained from the patient's wife following explanation of the procedure, risks, benefits and alternatives. All questions were addressed. A time out was performed prior to the initiation of the procedure. Maximal barrier sterile technique utilized including caps, mask, sterile gowns, sterile gloves, large sterile drape, hand hygiene, and Betadine prep. The right femoral head was marked fluoroscopically. Under sterile conditions and local anesthesia, the right common femoral artery access was performed with a micropuncture needle. Under direct ultrasound guidance, the right common femoral was accessed with a micropuncture kit. An ultrasound image was saved for documentation purposes. This allowed for placement of a 5-French vascular sheath. A limited arteriogram was performed through the  side arm of the sheath confirming appropriate access within the right common femoral artery. Over a Bentson wire, a Mickelson catheter was advanced the caudal aspect of the thoracic aorta where was reformed, back bled and flushed. The Mickelson catheter was then utilized to select the superior mesenteric artery and a selective superior mesenteric arteriogram was performed. With the use of a soft synchro microwire, a cantata microcatheter was utilized to select a more inferior division of a left-sided jejunal artery and a selective arteriogram was performed. The microcatheter was then advanced into the vessel's superiorly directed distal arcade and a sub selective arteriogram was performed. As no active bleeding was identified, yet this vessel appeared to supply the area contrast extravasation demonstrated on preceding CTA and as the bleeding can be intermittently identified angiographically, the decision was made to proceed with  cholecystostomy tube placement. _________________________________________________________ Under direct ultrasound guidance, the gallbladder was accessed with a 21 gauge needle. Appropriate access to the gallbladder was confirmed with a reflux purulent appearing bile as well as limited contrast injection. An ultrasound image was saved procedural documentation purposes. Wire was coiled within the gallbladder lumen and under intermittent fluoroscopic guidance, the access needle was exchanged for a Accustick set. Again, contrast injection confirmed appropriate positioning. Next, over a short Amplatz wire, the outer Accustick catheter was exchanged for a 10 [place] dilator and ultimately for a 10 [place] cholecystostomy tube with end coiled and locked within the gallbladder lumen. Limited contrast injection confirmed appropriate positioning. Postprocedural spot fluoroscopic image was obtained. A small amount of aspirated bile was capped and sent to the laboratory for analysis. The cholecystostomy  tube was flushed with a small amount of saline, connected to a gravity bag and secured at the skin entrance site with an interrupted suture. _________________________________________________________ Attention was once again paid towards the mesenteric arteriogram. Repeat sub selective injection was performed of the left-sided jejunal branch of the SMA however again was negative for discrete area of active extravasation or vessel irregularity. As such, the microcatheter was utilized to select the more superior left-sided jejunal branch of the SMA and a repeat selective arteriogram was performed. Again, the microcatheter was advanced to select both the superior and inferior divisions of this left-sided jejunal artery and sub selective arteriograms were performed. Finally, the microcatheter was utilized to select the more inferior left-sided jejunal branch and a completion arteriogram was performed. Images were reviewed and the procedure was terminated. All wires, catheters and sheaths were removed from the patient. Hemostasis was achieved at the right groin access site with deployment of an ExoSeal closure device. The patient tolerated the procedure well without immediate post procedural complication. FINDINGS: Repeat exhaustive superior mesenteric arteriogram with selective arteriograms of 2 left-sided jejunal branch of the SMA as well as sub selective injections of its distal arcade was negative for discrete area of active extravasation or vessel irregularity. Following cholecystostomy tube placement, cholecystostomy tube is appropriately positioned with end coiled and locked within the gallbladder lumen. IMPRESSION: 1. Despite exhaustive superior mesenteric arteriogram with selective arteriograms of two left-sided jejunal branches of the SMA as well as its distal arcade, no discrete area of active extravasation or vessel irregularity is identified. No additional embolization performed. 2. Successful image guided  placement of a cholecystostomy tube yielding a return of purulent, foul-smelling bile. A sample of aspirated bile was capped and sent to the laboratory analysis. PLAN: - The patient is to remain flat for 4 hours with right leg straight. - If there remains clinical concern for residual or recurrent GI bleeding repeat endoscopy could be performed as indicated. Ultimately, patient may benefit from formal surgical consultation. Electronically Signed   By: Sandi Mariscal M.D.   On: 09/08/2021 14:50   IR US Guide Vasc Access Right  Result Date: 09/08/2021 INDICATION: History of GI bleeding within the proximal jejunum secondary to presumed small bowel diverticuli, post attempted endoscopic clipping, mesenteric arteriogram and percutaneous coil embolization of the distal arcade supplying the small bowel marked by the endoscopy clip. Unfortunately, the patient again was found to be hemodynamically unstable today with concern for recurrent bleeding though preceding abdominal CT performed 09/07/2021 was negative for recurrent GI bleeding. As the area of the endoscopy clip and embolization coils does NOT appear to correlate with the area of bleeding demonstrated on preceding abdominal CT performed 09/06/2021 or proceed with repeat mesenteric  arteriogram and potential embolization. Additionally, preceding abdominal CT demonstrates air, wall thickening and fluid surrounding the gallbladder worrisome for concomitant acute cholecystitis, supported by patient's elevated white blood cell count, and as such will also proceed with image guided placement of a cholecystostomy tube. EXAM: 1. ULTRASOUND GUIDANCE FOR ARTERIAL ACCESS 2. SELECTIVE SUPERIOR MESENTERIC ARTERIOGRAM 3. SELECTIVE ARTERIOGRAM OF TWO LEFT-SIDED JEJUNAL BRANCHES OF THE SMA INCLUDING SUB SELECTIVE ARTERIOGRAMS OF THEIR DISTAL ARCADE BRANCHES. 4. ULTRASOUND AND FLUOROSCOPIC GUIDED CHOLECYSTOSTOMY TUBE PLACEMENT COMPARISON:  Mesenteric arteriogram and percutaneous coil  embolization-09/06/2021 CTA abdomen pelvis-09/07/2021; 09/06/2021; 09/05/2021 MEDICATIONS: Rocephin 2 g IV; the antibiotics were administered with an appropriate time frame prior to the initiation of the procedure. ANESTHESIA/SEDATION: Moderate (conscious) sedation was employed during this procedure. A total of Versed 4 mg and Fentanyl 200 mcg was administered intravenously. Moderate Sedation Time: 70 minutes. The patient's level of consciousness and vital signs were monitored continuously by radiology nursing throughout the procedure under my direct supervision. CONTRAST:  70 cc Omnipaque 300 FLUOROSCOPY TIME:  16 minutes, 12 seconds (0,254 mGy) COMPLICATIONS: None immediate. PROCEDURE: Informed consent was obtained from the patient's wife following explanation of the procedure, risks, benefits and alternatives. All questions were addressed. A time out was performed prior to the initiation of the procedure. Maximal barrier sterile technique utilized including caps, mask, sterile gowns, sterile gloves, large sterile drape, hand hygiene, and Betadine prep. The right femoral head was marked fluoroscopically. Under sterile conditions and local anesthesia, the right common femoral artery access was performed with a micropuncture needle. Under direct ultrasound guidance, the right common femoral was accessed with a micropuncture kit. An ultrasound image was saved for documentation purposes. This allowed for placement of a 5-French vascular sheath. A limited arteriogram was performed through the side arm of the sheath confirming appropriate access within the right common femoral artery. Over a Bentson wire, a Mickelson catheter was advanced the caudal aspect of the thoracic aorta where was reformed, back bled and flushed. The Mickelson catheter was then utilized to select the superior mesenteric artery and a selective superior mesenteric arteriogram was performed. With the use of a soft synchro microwire, a cantata  microcatheter was utilized to select a more inferior division of a left-sided jejunal artery and a selective arteriogram was performed. The microcatheter was then advanced into the vessel's superiorly directed distal arcade and a sub selective arteriogram was performed. As no active bleeding was identified, yet this vessel appeared to supply the area contrast extravasation demonstrated on preceding CTA and as the bleeding can be intermittently identified angiographically, the decision was made to proceed with cholecystostomy tube placement. _________________________________________________________ Under direct ultrasound guidance, the gallbladder was accessed with a 21 gauge needle. Appropriate access to the gallbladder was confirmed with a reflux purulent appearing bile as well as limited contrast injection. An ultrasound image was saved procedural documentation purposes. Wire was coiled within the gallbladder lumen and under intermittent fluoroscopic guidance, the access needle was exchanged for a Accustick set. Again, contrast injection confirmed appropriate positioning. Next, over a short Amplatz wire, the outer Accustick catheter was exchanged for a 10 [place] dilator and ultimately for a 10 [place] cholecystostomy tube with end coiled and locked within the gallbladder lumen. Limited contrast injection confirmed appropriate positioning. Postprocedural spot fluoroscopic image was obtained. A small amount of aspirated bile was capped and sent to the laboratory for analysis. The cholecystostomy tube was flushed with a small amount of saline, connected to a gravity bag and secured at the skin entrance site  with an interrupted suture. _________________________________________________________ Attention was once again paid towards the mesenteric arteriogram. Repeat sub selective injection was performed of the left-sided jejunal branch of the SMA however again was negative for discrete area of active extravasation or  vessel irregularity. As such, the microcatheter was utilized to select the more superior left-sided jejunal branch of the SMA and a repeat selective arteriogram was performed. Again, the microcatheter was advanced to select both the superior and inferior divisions of this left-sided jejunal artery and sub selective arteriograms were performed. Finally, the microcatheter was utilized to select the more inferior left-sided jejunal branch and a completion arteriogram was performed. Images were reviewed and the procedure was terminated. All wires, catheters and sheaths were removed from the patient. Hemostasis was achieved at the right groin access site with deployment of an ExoSeal closure device. The patient tolerated the procedure well without immediate post procedural complication. FINDINGS: Repeat exhaustive superior mesenteric arteriogram with selective arteriograms of 2 left-sided jejunal branch of the SMA as well as sub selective injections of its distal arcade was negative for discrete area of active extravasation or vessel irregularity. Following cholecystostomy tube placement, cholecystostomy tube is appropriately positioned with end coiled and locked within the gallbladder lumen. IMPRESSION: 1. Despite exhaustive superior mesenteric arteriogram with selective arteriograms of two left-sided jejunal branches of the SMA as well as its distal arcade, no discrete area of active extravasation or vessel irregularity is identified. No additional embolization performed. 2. Successful image guided placement of a cholecystostomy tube yielding a return of purulent, foul-smelling bile. A sample of aspirated bile was capped and sent to the laboratory analysis. PLAN: - The patient is to remain flat for 4 hours with right leg straight. - If there remains clinical concern for residual or recurrent GI bleeding repeat endoscopy could be performed as indicated. Ultimately, patient may benefit from formal surgical consultation.  Electronically Signed   By: Sandi Mariscal M.D.   On: 09/08/2021 14:50   IR US Guide Bx Asp/Drain  Result Date: 09/08/2021 INDICATION: History of GI bleeding within the proximal jejunum secondary to presumed small bowel diverticuli, post attempted endoscopic clipping, mesenteric arteriogram and percutaneous coil embolization of the distal arcade supplying the small bowel marked by the endoscopy clip. Unfortunately, the patient again was found to be hemodynamically unstable today with concern for recurrent bleeding though preceding abdominal CT performed 09/07/2021 was negative for recurrent GI bleeding. As the area of the endoscopy clip and embolization coils does NOT appear to correlate with the area of bleeding demonstrated on preceding abdominal CT performed 09/06/2021 or proceed with repeat mesenteric arteriogram and potential embolization. Additionally, preceding abdominal CT demonstrates air, wall thickening and fluid surrounding the gallbladder worrisome for concomitant acute cholecystitis, supported by patient's elevated white blood cell count, and as such will also proceed with image guided placement of a cholecystostomy tube. EXAM: 1. ULTRASOUND GUIDANCE FOR ARTERIAL ACCESS 2. SELECTIVE SUPERIOR MESENTERIC ARTERIOGRAM 3. SELECTIVE ARTERIOGRAM OF TWO LEFT-SIDED JEJUNAL BRANCHES OF THE SMA INCLUDING SUB SELECTIVE ARTERIOGRAMS OF THEIR DISTAL ARCADE BRANCHES. 4. ULTRASOUND AND FLUOROSCOPIC GUIDED CHOLECYSTOSTOMY TUBE PLACEMENT COMPARISON:  Mesenteric arteriogram and percutaneous coil embolization-09/06/2021 CTA abdomen pelvis-09/07/2021; 09/06/2021; 09/05/2021 MEDICATIONS: Rocephin 2 g IV; the antibiotics were administered with an appropriate time frame prior to the initiation of the procedure. ANESTHESIA/SEDATION: Moderate (conscious) sedation was employed during this procedure. A total of Versed 4 mg and Fentanyl 200 mcg was administered intravenously. Moderate Sedation Time: 70 minutes. The patient's level  of consciousness and vital signs were monitored  continuously by radiology nursing throughout the procedure under my direct supervision. CONTRAST:  70 cc Omnipaque 300 FLUOROSCOPY TIME:  16 minutes, 12 seconds (7,342 mGy) COMPLICATIONS: None immediate. PROCEDURE: Informed consent was obtained from the patient's wife following explanation of the procedure, risks, benefits and alternatives. All questions were addressed. A time out was performed prior to the initiation of the procedure. Maximal barrier sterile technique utilized including caps, mask, sterile gowns, sterile gloves, large sterile drape, hand hygiene, and Betadine prep. The right femoral head was marked fluoroscopically. Under sterile conditions and local anesthesia, the right common femoral artery access was performed with a micropuncture needle. Under direct ultrasound guidance, the right common femoral was accessed with a micropuncture kit. An ultrasound image was saved for documentation purposes. This allowed for placement of a 5-French vascular sheath. A limited arteriogram was performed through the side arm of the sheath confirming appropriate access within the right common femoral artery. Over a Bentson wire, a Mickelson catheter was advanced the caudal aspect of the thoracic aorta where was reformed, back bled and flushed. The Mickelson catheter was then utilized to select the superior mesenteric artery and a selective superior mesenteric arteriogram was performed. With the use of a soft synchro microwire, a cantata microcatheter was utilized to select a more inferior division of a left-sided jejunal artery and a selective arteriogram was performed. The microcatheter was then advanced into the vessel's superiorly directed distal arcade and a sub selective arteriogram was performed. As no active bleeding was identified, yet this vessel appeared to supply the area contrast extravasation demonstrated on preceding CTA and as the bleeding can be  intermittently identified angiographically, the decision was made to proceed with cholecystostomy tube placement. _________________________________________________________ Under direct ultrasound guidance, the gallbladder was accessed with a 21 gauge needle. Appropriate access to the gallbladder was confirmed with a reflux purulent appearing bile as well as limited contrast injection. An ultrasound image was saved procedural documentation purposes. Wire was coiled within the gallbladder lumen and under intermittent fluoroscopic guidance, the access needle was exchanged for a Accustick set. Again, contrast injection confirmed appropriate positioning. Next, over a short Amplatz wire, the outer Accustick catheter was exchanged for a 10 [place] dilator and ultimately for a 10 [place] cholecystostomy tube with end coiled and locked within the gallbladder lumen. Limited contrast injection confirmed appropriate positioning. Postprocedural spot fluoroscopic image was obtained. A small amount of aspirated bile was capped and sent to the laboratory for analysis. The cholecystostomy tube was flushed with a small amount of saline, connected to a gravity bag and secured at the skin entrance site with an interrupted suture. _________________________________________________________ Attention was once again paid towards the mesenteric arteriogram. Repeat sub selective injection was performed of the left-sided jejunal branch of the SMA however again was negative for discrete area of active extravasation or vessel irregularity. As such, the microcatheter was utilized to select the more superior left-sided jejunal branch of the SMA and a repeat selective arteriogram was performed. Again, the microcatheter was advanced to select both the superior and inferior divisions of this left-sided jejunal artery and sub selective arteriograms were performed. Finally, the microcatheter was utilized to select the more inferior left-sided jejunal  branch and a completion arteriogram was performed. Images were reviewed and the procedure was terminated. All wires, catheters and sheaths were removed from the patient. Hemostasis was achieved at the right groin access site with deployment of an ExoSeal closure device. The patient tolerated the procedure well without immediate post procedural complication. FINDINGS: Repeat  exhaustive superior mesenteric arteriogram with selective arteriograms of 2 left-sided jejunal branch of the SMA as well as sub selective injections of its distal arcade was negative for discrete area of active extravasation or vessel irregularity. Following cholecystostomy tube placement, cholecystostomy tube is appropriately positioned with end coiled and locked within the gallbladder lumen. IMPRESSION: 1. Despite exhaustive superior mesenteric arteriogram with selective arteriograms of two left-sided jejunal branches of the SMA as well as its distal arcade, no discrete area of active extravasation or vessel irregularity is identified. No additional embolization performed. 2. Successful image guided placement of a cholecystostomy tube yielding a return of purulent, foul-smelling bile. A sample of aspirated bile was capped and sent to the laboratory analysis. PLAN: - The patient is to remain flat for 4 hours with right leg straight. - If there remains clinical concern for residual or recurrent GI bleeding repeat endoscopy could be performed as indicated. Ultimately, patient may benefit from formal surgical consultation. Electronically Signed   By: Sandi Mariscal M.D.   On: 09/08/2021 14:50   IR Guided Drain W Catheter Placement  Result Date: 09/08/2021 INDICATION: History of GI bleeding within the proximal jejunum secondary to presumed small bowel diverticuli, post attempted endoscopic clipping, mesenteric arteriogram and percutaneous coil embolization of the distal arcade supplying the small bowel marked by the endoscopy clip. Unfortunately,  the patient again was found to be hemodynamically unstable today with concern for recurrent bleeding though preceding abdominal CT performed 09/07/2021 was negative for recurrent GI bleeding. As the area of the endoscopy clip and embolization coils does NOT appear to correlate with the area of bleeding demonstrated on preceding abdominal CT performed 09/06/2021 or proceed with repeat mesenteric arteriogram and potential embolization. Additionally, preceding abdominal CT demonstrates air, wall thickening and fluid surrounding the gallbladder worrisome for concomitant acute cholecystitis, supported by patient's elevated white blood cell count, and as such will also proceed with image guided placement of a cholecystostomy tube. EXAM: 1. ULTRASOUND GUIDANCE FOR ARTERIAL ACCESS 2. SELECTIVE SUPERIOR MESENTERIC ARTERIOGRAM 3. SELECTIVE ARTERIOGRAM OF TWO LEFT-SIDED JEJUNAL BRANCHES OF THE SMA INCLUDING SUB SELECTIVE ARTERIOGRAMS OF THEIR DISTAL ARCADE BRANCHES. 4. ULTRASOUND AND FLUOROSCOPIC GUIDED CHOLECYSTOSTOMY TUBE PLACEMENT COMPARISON:  Mesenteric arteriogram and percutaneous coil embolization-09/06/2021 CTA abdomen pelvis-09/07/2021; 09/06/2021; 09/05/2021 MEDICATIONS: Rocephin 2 g IV; the antibiotics were administered with an appropriate time frame prior to the initiation of the procedure. ANESTHESIA/SEDATION: Moderate (conscious) sedation was employed during this procedure. A total of Versed 4 mg and Fentanyl 200 mcg was administered intravenously. Moderate Sedation Time: 70 minutes. The patient's level of consciousness and vital signs were monitored continuously by radiology nursing throughout the procedure under my direct supervision. CONTRAST:  70 cc Omnipaque 300 FLUOROSCOPY TIME:  16 minutes, 12 seconds (4,128 mGy) COMPLICATIONS: None immediate. PROCEDURE: Informed consent was obtained from the patient's wife following explanation of the procedure, risks, benefits and alternatives. All questions were  addressed. A time out was performed prior to the initiation of the procedure. Maximal barrier sterile technique utilized including caps, mask, sterile gowns, sterile gloves, large sterile drape, hand hygiene, and Betadine prep. The right femoral head was marked fluoroscopically. Under sterile conditions and local anesthesia, the right common femoral artery access was performed with a micropuncture needle. Under direct ultrasound guidance, the right common femoral was accessed with a micropuncture kit. An ultrasound image was saved for documentation purposes. This allowed for placement of a 5-French vascular sheath. A limited arteriogram was performed through the side arm of the sheath confirming appropriate access within  the right common femoral artery. Over a Bentson wire, a Mickelson catheter was advanced the caudal aspect of the thoracic aorta where was reformed, back bled and flushed. The Mickelson catheter was then utilized to select the superior mesenteric artery and a selective superior mesenteric arteriogram was performed. With the use of a soft synchro microwire, a cantata microcatheter was utilized to select a more inferior division of a left-sided jejunal artery and a selective arteriogram was performed. The microcatheter was then advanced into the vessel's superiorly directed distal arcade and a sub selective arteriogram was performed. As no active bleeding was identified, yet this vessel appeared to supply the area contrast extravasation demonstrated on preceding CTA and as the bleeding can be intermittently identified angiographically, the decision was made to proceed with cholecystostomy tube placement. _________________________________________________________ Under direct ultrasound guidance, the gallbladder was accessed with a 21 gauge needle. Appropriate access to the gallbladder was confirmed with a reflux purulent appearing bile as well as limited contrast injection. An ultrasound image was saved  procedural documentation purposes. Wire was coiled within the gallbladder lumen and under intermittent fluoroscopic guidance, the access needle was exchanged for a Accustick set. Again, contrast injection confirmed appropriate positioning. Next, over a short Amplatz wire, the outer Accustick catheter was exchanged for a 10 [place] dilator and ultimately for a 10 [place] cholecystostomy tube with end coiled and locked within the gallbladder lumen. Limited contrast injection confirmed appropriate positioning. Postprocedural spot fluoroscopic image was obtained. A small amount of aspirated bile was capped and sent to the laboratory for analysis. The cholecystostomy tube was flushed with a small amount of saline, connected to a gravity bag and secured at the skin entrance site with an interrupted suture. _________________________________________________________ Attention was once again paid towards the mesenteric arteriogram. Repeat sub selective injection was performed of the left-sided jejunal branch of the SMA however again was negative for discrete area of active extravasation or vessel irregularity. As such, the microcatheter was utilized to select the more superior left-sided jejunal branch of the SMA and a repeat selective arteriogram was performed. Again, the microcatheter was advanced to select both the superior and inferior divisions of this left-sided jejunal artery and sub selective arteriograms were performed. Finally, the microcatheter was utilized to select the more inferior left-sided jejunal branch and a completion arteriogram was performed. Images were reviewed and the procedure was terminated. All wires, catheters and sheaths were removed from the patient. Hemostasis was achieved at the right groin access site with deployment of an ExoSeal closure device. The patient tolerated the procedure well without immediate post procedural complication. FINDINGS: Repeat exhaustive superior mesenteric arteriogram  with selective arteriograms of 2 left-sided jejunal branch of the SMA as well as sub selective injections of its distal arcade was negative for discrete area of active extravasation or vessel irregularity. Following cholecystostomy tube placement, cholecystostomy tube is appropriately positioned with end coiled and locked within the gallbladder lumen. IMPRESSION: 1. Despite exhaustive superior mesenteric arteriogram with selective arteriograms of two left-sided jejunal branches of the SMA as well as its distal arcade, no discrete area of active extravasation or vessel irregularity is identified. No additional embolization performed. 2. Successful image guided placement of a cholecystostomy tube yielding a return of purulent, foul-smelling bile. A sample of aspirated bile was capped and sent to the laboratory analysis. PLAN: - The patient is to remain flat for 4 hours with right leg straight. - If there remains clinical concern for residual or recurrent GI bleeding repeat endoscopy could be performed as  indicated. Ultimately, patient may benefit from formal surgical consultation. Electronically Signed   By: Sandi Mariscal M.D.   On: 09/08/2021 14:50   DG CHEST PORT 1 VIEW  Result Date: 09/08/2021 CLINICAL DATA:  Central line placement. EXAM: PORTABLE CHEST 1 VIEW COMPARISON:  September 07, 2021. FINDINGS: Stable cardiomediastinal silhouette. Endotracheal and nasogastric tubes are in grossly good position. Interval placement of right internal jugular catheter with distal tip in expected position of cavoatrial junction. Mild bibasilar subsegmental atelectasis is noted with small pleural effusions. IMPRESSION: Interval placement of right internal jugular catheter with distal tip in expected position of cavoatrial junction. No pneumothorax is noted. Hypoinflation of the lungs with mild bibasilar subsegmental atelectasis and small pleural effusions. Electronically Signed   By: Marijo Conception M.D.   On: 09/08/2021 14:34    CT ANGIO GI BLEED  Result Date: 09/07/2021 CLINICAL DATA:  Embolization for GI bleeding. Patient now with rebleeding. EXAM: CTA ABDOMEN AND PELVIS WITHOUT AND WITH CONTRAST TECHNIQUE: Multidetector CT imaging of the abdomen and pelvis was performed using the standard protocol during bolus administration of intravenous contrast. Multiplanar reconstructed images and MIPs were obtained and reviewed to evaluate the vascular anatomy. RADIATION DOSE REDUCTION: This exam was performed according to the departmental dose-optimization program which includes automated exposure control, adjustment of the mA and/or kV according to patient size and/or use of iterative reconstruction technique. CONTRAST:  72m OMNIPAQUE IOHEXOL 300 MG/ML  SOLN COMPARISON:  GI bleeding study, 09/06/2021. FINDINGS: VASCULAR Aorta: Mild atherosclerosis. No aneurysm or dissection. No significant stenosis. Celiac: Patent without evidence of aneurysm, dissection, vasculitis or significant stenosis. SMA: Patent without evidence of aneurysm, dissection, vasculitis or significant stenosis. Renals: Both renal arteries are patent without evidence of aneurysm, dissection, vasculitis, fibromuscular dysplasia or significant stenosis. IMA: Small with patent. Inflow: Patent without evidence of aneurysm, dissection, vasculitis or significant stenosis. Proximal Outflow: Bilateral common femoral and visualized portions of the superficial and profunda femoral arteries are patent without evidence of aneurysm, dissection, vasculitis or significant stenosis. Veins: Unremarkable. Review of the MIP images confirms the above findings. NON-VASCULAR Lower chest: Dependent lung base atelectasis, new since the previous day's study. Previously noted small nodules are obscured. Hepatobiliary: Liver normal size. Decreased liver attenuation consistent with fatty infiltration. No mass or focal lesion. Gallbladder is distended. There are dependent gallstones. Air is seen in the  non dependent gallbladder and along the anterior superior wall. There is hazy inflammatory change adjacent to the gallbladder and in the right upper quadrant, new compared to the previous day's CT. No bile duct dilation. Pancreas: Unremarkable. No pancreatic ductal dilatation or surrounding inflammatory changes. Spleen: Normal in size without focal abnormality. Adrenals/Urinary Tract: No adrenal masses. Kidneys normal in overall size, orientation and position with symmetric enhancement. Small intrarenal stones. Residual renal contrast. Right kidney midpole cyst, 1.5 cm. No follow-up recommended. No hydronephrosis. Normal ureters. Bladder decompressed with a Foley catheter. Stomach/Bowel: Previously seen arterial enhancement extravasation in the left mid abdomen, arising from a diverticulum along the jejunum, is no longer appreciated. The diverticulum is again noted as are several additional small bowel diverticula. There are no current areas of abnormal bowel enhancement or extravasation of contrast into the lumen to suggest an active GI bleeding source. Linear metal densities project adjacent to a loop of jejunum consistent with embolization coils. This is seen on axial image 50, series 19, coronal image 55, series 14. Nasogastric tube curls within the stomach. No stomach wall thickening or inflammation. Small bowel and colon are normal  in caliber. There is no bowel wall thickening. No adjacent mesenteric inflammation. Enteric capsule now lies within the right colon. Lymphatic: No enlarged lymph nodes. Reproductive: Unremarkable. Other: Trace ascites adjacent to the liver. Musculoskeletal: No fracture or acute finding.  No bone lesion. IMPRESSION: VASCULAR 1. Mild aortic atherosclerosis.  No aneurysm or dissection. 2. Aortic branch vessels are widely patent. NON-VASCULAR 1. 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 2. No current evidence  of an active GI bleeding source. 3. Abnormal gallbladder including non dependent air, gallbladder wall air and pericholecystic and right upper quadrant inflammation developing since the previous day's CT. Findings consistent with acute cholecystitis. 4. Trace ascites, new since the previous day's CT. 5. New dependent lung base atelectasis. Electronically Signed   By: Lajean Manes M.D.   On: 09/07/2021 16:38   DG Abd Portable 1V  Result Date: 09/07/2021 CLINICAL DATA:  Nasogastric tube placement. EXAM: PORTABLE ABDOMEN - 1 VIEW COMPARISON:  None Available. FINDINGS: Distal tip of nasogastric tube is seen within the stomach. IMPRESSION: Distal tip of nasogastric tube seen within the stomach. Electronically Signed   By: Marijo Conception M.D.   On: 09/07/2021 14:41   DG Chest Port 1 View  Result Date: 09/07/2021 CLINICAL DATA:  Intubation. EXAM: PORTABLE CHEST 1 VIEW COMPARISON:  Same day. FINDINGS: Endotracheal tube is in grossly good position. Distal tip of nasogastric tube is seen in the stomach. Right-sided PICC line is noted with distal tip in expected position of cavoatrial junction. Elevated right hemidiaphragm is noted with mild right basilar atelectasis. IMPRESSION: Endotracheal and nasogastric tubes are in grossly good position. Electronically Signed   By: Marijo Conception M.D.   On: 09/07/2021 14:40   DG Chest Port 1 View  Result Date: 09/07/2021 CLINICAL DATA:  61 year old male with bleeding small bowel diverticula. Hypoxia. EXAM: PORTABLE CHEST 1 VIEW COMPARISON:  Portable chest 09/06/2021 and earlier. FINDINGS: Portable AP semi upright view at 0954 hours. Very low lung volumes now. There is a right side central venous catheter. Stable cardiac size and mediastinal contours. Allowing for portable technique the lungs are clear. Visualized tracheal air column is within normal limits. Visible bowel-gas pattern within normal limits. No acute osseous abnormality identified. IMPRESSION: Very Low lung  volumes.  No acute cardiopulmonary abnormality. Electronically Signed   By: Genevie Ann M.D.   On: 09/07/2021 10:03   [place] EKG SITE RITE  Result Date: 09/06/2021 If Site Rite image not attached, placement could not be confirmed due to current cardiac rhythm.  DG CHEST PORT 1 VIEW  Result Date: 09/06/2021 CLINICAL DATA:  Shortness of breath. EXAM: PORTABLE CHEST 1 VIEW COMPARISON:  October 10, 2004. FINDINGS: Stable cardiomediastinal silhouette. Lungs are clear. Mildly elevated right hemidiaphragm is noted. Bony thorax is unremarkable. IMPRESSION: No active disease. Electronically Signed   By: Marijo Conception M.D.   On: 09/06/2021 16:37    Labs:  CBC: Recent Labs    09/09/21 2112 09/09/21 2351 09/10/21 0159 09/10/21 0520 09/10/21 0522 09/10/21 1047  WBC 15.6*  --  18.6* 16.9* 16.7*  --   HGB 6.4*   < > 7.1* 7.0* 6.8* 5.3*  HCT 18.2*   < > 20.4* 19.9* 20.3* 15.9*  PLT 76*  --  66* 66* 67*  --    < > = values in this interval not displayed.    COAGS: Recent Labs    04/28/21 1504 09/05/21 0307 09/06/21  2240 09/07/21 2056  INR 1.0 1.2 1.6* 2.0*  APTT 26  --   --  32    BMP: Recent Labs    09/08/21 1808 09/08/21 1907 09/09/21 0426 09/09/21 1738 09/10/21 0159  NA 137 136 139 139 138  K 5.0 5.0 4.7 4.3 4.2  CL 110  --  106 110 108  CO2 19*  --  '22 24 25  ' GLUCOSE 228*  --  208* 234* 295*  BUN 48*  --  38* 39* 36*  CALCIUM 6.0*  --  6.6* 6.5* 6.7*  CREATININE 2.98*  --  2.09* 1.65* 1.27*  GFRNONAA 24*  --  36* 48* >60    LIVER FUNCTION TESTS: Recent Labs    04/29/21 0502 09/04/21 1238 09/06/21 2240 09/07/21 2054 09/08/21 1808 09/09/21 0426 09/09/21 1738 09/10/21 0159  BILITOT 0.5 0.8 0.4 0.5  --   --   --   --   AST '20 15 21 ' 33  --   --   --   --   ALT '22 17 16 ' 32  --   --   --   --   ALKPHOS 34* 35* 21* 124  --   --   --   --   PROT 6.8 6.9 3.7* 3.2*  --   --   --   --   ALBUMIN 3.6 3.7 1.8* <1.5* <1.5* <1.5* <1.5* <1.5*    Assessment and Plan: GI Bleed s/p  coil embolization by Dr. Pascal Lux 9/3 Ongoing melena.  No BRBPR.  HgB continues to trend downward requiring multiple transfusions. Hgb 5.3 this AM.  Dr. Pascal Lux has discussed with CCM. Limited role for IR in patient with slow ooze vs. Intermittent bleed if no new demonstration of source by endoscopy or CTA.  CTA pending.   Acute Cholecystitis s/p percutaneous cholecystostomy tube placement 9/3 by Dr. Pascal Lux. WBC improved to 16.7   Drain Location: RUQ Size: Fr size: 10 Fr Date of placement: 09/07/21  Currently to: Drain collection device: gravity 24 hour output:  Output by Drain (mL) 09/08/21 0701 - 09/08/21 1900 09/08/21 1901 - 09/09/21 0700 09/09/21 0701 - 09/09/21 1900 09/09/21 1901 - 09/10/21 0700 09/10/21 0701 - 09/10/21 1451  Closed System Drain 1 Lateral RUQ Other (Comment) 10.2 Fr. 150 50  15 95    Interval imaging/drain manipulation:  None  Current examination: Flushes/aspirates easily.  Insertion site unremarkable. Suture and stat lock in place. Dressed appropriately.   Plan: Continue TID flushes with 5 cc NS. Record output Q shift. Dressing changes QD or PRN if soiled.  Call IR APP or on call IR MD if difficulty flushing or sudden change in drain output.   Discharge planning: Please contact IR APP or on call IR MD prior to patient d/c to ensure appropriate follow up plans are in place.   IR will continue to follow - please call with questions or concerns.   Electronically Signed: Docia Barrier, PA 09/10/2021, 2:44 PM   I spent a total of 15 Minutes at the the patient's bedside AND on the patient's hospital floor or unit, greater than 50% of which was counseling/coordinating care for GI bleed, acute cholecystitis.

## 2021-09-11 DIAGNOSIS — K289 Gastrojejunal ulcer, unspecified as acute or chronic, without hemorrhage or perforation: Secondary | ICD-10-CM | POA: Diagnosis not present

## 2021-09-11 DIAGNOSIS — N170 Acute kidney failure with tubular necrosis: Secondary | ICD-10-CM | POA: Diagnosis not present

## 2021-09-11 DIAGNOSIS — D500 Iron deficiency anemia secondary to blood loss (chronic): Secondary | ICD-10-CM | POA: Diagnosis not present

## 2021-09-11 DIAGNOSIS — J9601 Acute respiratory failure with hypoxia: Secondary | ICD-10-CM | POA: Diagnosis not present

## 2021-09-11 DIAGNOSIS — K625 Hemorrhage of anus and rectum: Secondary | ICD-10-CM | POA: Diagnosis not present

## 2021-09-11 DIAGNOSIS — R579 Shock, unspecified: Secondary | ICD-10-CM | POA: Diagnosis not present

## 2021-09-11 DIAGNOSIS — E1110 Type 2 diabetes mellitus with ketoacidosis without coma: Secondary | ICD-10-CM | POA: Diagnosis not present

## 2021-09-11 DIAGNOSIS — K922 Gastrointestinal hemorrhage, unspecified: Secondary | ICD-10-CM | POA: Diagnosis not present

## 2021-09-11 DIAGNOSIS — I10 Essential (primary) hypertension: Secondary | ICD-10-CM | POA: Diagnosis not present

## 2021-09-11 DIAGNOSIS — D62 Acute posthemorrhagic anemia: Secondary | ICD-10-CM | POA: Diagnosis not present

## 2021-09-11 LAB — RENAL FUNCTION PANEL
Albumin: <1.5 g/dL — ABNORMAL LOW (ref 3.5–5.0)
Albumin: <1.5 g/dL — ABNORMAL LOW (ref 3.5–5.0)
Anion gap: 8 (ref 5–15)
Anion gap: 4 — ABNORMAL LOW (ref 5–15)
BUN: 30 mg/dL — ABNORMAL HIGH (ref 6–20)
BUN: 27 mg/dL — ABNORMAL HIGH (ref 6–20)
CO2: 24 mmol/L (ref 22–32)
CO2: 24 mmol/L (ref 22–32)
Calcium: 6.9 mg/dL — ABNORMAL LOW (ref 8.9–10.3)
Calcium: 6.5 mg/dL — ABNORMAL LOW (ref 8.9–10.3)
Chloride: 107 mmol/L (ref 98–111)
Chloride: 113 mmol/L — ABNORMAL HIGH (ref 98–111)
Creatinine, Ser: 0.85 mg/dL (ref 0.61–1.24)
Creatinine, Ser: 0.84 mg/dL (ref 0.61–1.24)
GFR, Estimated: >60 mL/min (ref >60)
GFR, Estimated: >60 mL/min (ref >60)
Glucose, Bld: 139 mg/dL — ABNORMAL HIGH (ref 70–99)
Glucose, Bld: 153 mg/dL — ABNORMAL HIGH (ref 70–99)
Phosphorus: 2.1 mg/dL — ABNORMAL LOW (ref 2.5–4.6)
Phosphorus: 3.1 mg/dL (ref 2.5–4.6)
Potassium: 4 mmol/L (ref 3.5–5.1)
Potassium: 3.7 mmol/L (ref 3.5–5.1)
Sodium: 139 mmol/L (ref 135–145)
Sodium: 141 mmol/L (ref 135–145)

## 2021-09-11 LAB — CBC
HCT: 30.5 % — ABNORMAL LOW (ref 39.0–52.0)
Hemoglobin: 10.6 g/dL — ABNORMAL LOW (ref 13.0–17.0)
MCH: 28.6 pg (ref 26.0–34.0)
MCHC: 34.8 g/dL (ref 30.0–36.0)
MCV: 82.4 fL (ref 80.0–100.0)
Platelets: 47 10*3/uL — ABNORMAL LOW (ref 150–400)
RBC: 3.7 MIL/uL — ABNORMAL LOW (ref 4.22–5.81)
RDW: 17 % — ABNORMAL HIGH (ref 11.5–15.5)
WBC: 18.5 10*3/uL — ABNORMAL HIGH (ref 4.0–10.5)
nRBC: 1.5 % — ABNORMAL HIGH (ref 0.0–0.2)

## 2021-09-11 LAB — GLUCOSE, CAPILLARY
Glucose-Capillary: 133 mg/dL — ABNORMAL HIGH (ref 70–99)
Glucose-Capillary: 132 mg/dL — ABNORMAL HIGH (ref 70–99)
Glucose-Capillary: 135 mg/dL — ABNORMAL HIGH (ref 70–99)
Glucose-Capillary: 159 mg/dL — ABNORMAL HIGH (ref 70–99)
Glucose-Capillary: 141 mg/dL — ABNORMAL HIGH (ref 70–99)
Glucose-Capillary: 121 mg/dL — ABNORMAL HIGH (ref 70–99)

## 2021-09-11 LAB — MAGNESIUM: Magnesium: 2.5 mg/dL — ABNORMAL HIGH (ref 1.7–2.4)

## 2021-09-11 SURGERY — ENTEROSCOPY
Anesthesia: Monitor Anesthesia Care

## 2021-09-11 MED ORDER — INSULIN GLARGINE-YFGN 100 UNIT/ML ~~LOC~~ SOLN
15.0000 [IU] | Freq: Every day | SUBCUTANEOUS | Status: DC
  Administered 2021-09-11 – 2021-09-13 (×3): 15 [IU] via SUBCUTANEOUS
  Filled 2021-09-11 (×4): qty 0.15

## 2021-09-11 MED ORDER — CALCIUM GLUCONATE-NACL 1-0.675 GM/50ML-% IV SOLN
1.0000 g | Freq: Once | INTRAVENOUS | Status: AC
  Administered 2021-09-11: 1000 mg via INTRAVENOUS
  Filled 2021-09-11: qty 50

## 2021-09-11 MED ORDER — SODIUM CHLORIDE 0.9 % IV SOLN
2.0000 g | INTRAVENOUS | Status: AC
  Administered 2021-09-11 – 2021-09-13 (×3): 2 g via INTRAVENOUS
  Filled 2021-09-11 (×3): qty 20

## 2021-09-11 MED ORDER — HYDRALAZINE HCL 20 MG/ML IJ SOLN
10.0000 mg | Freq: Four times a day (QID) | INTRAMUSCULAR | Status: DC | PRN
  Administered 2021-09-11 – 2021-09-14 (×2): 10 mg via INTRAVENOUS
  Filled 2021-09-11 (×3): qty 1

## 2021-09-11 MED ORDER — DEXTROSE 5 % IV SOLN
15.0000 mmol | Freq: Once | INTRAVENOUS | Status: AC
Start: 2021-09-11 — End: 2021-09-11
  Administered 2021-09-11: 15 mmol via INTRAVENOUS
  Filled 2021-09-11: qty 5

## 2021-09-11 NOTE — Inpatient Diabetes Management (Signed)
Inpatient Diabetes Program Recommendations  AACE/ADA: New Consensus Statement on Inpatient Glycemic Control (2015)  Target Ranges:  Prepandial:   less than 140 mg/dL      Peak postprandial:   less than 180 mg/dL (1-2 hours)      Critically ill patients:  140 - 180 mg/dL   Lab Results  Component Value Date   GLUCAP 132 (H) 09/11/2021   HGBA1C 6.2 (H) 04/29/2021    Review of Glycemic Control  Latest Reference Range & Units 09/10/21 07:15 09/10/21 11:10 09/10/21 15:28 09/10/21 20:07 09/10/21 23:04 09/11/21 03:01 09/11/21 07:30  Glucose-Capillary 70 - 99 mg/dL 568 (H) 127 (H) 517 (H) 146 (H) 130 (H) 133 (H) 132 (H)   Inpatient Diabetes Program Recommendations:   Reviewed CBGs. Tube feeds remain on hold. May consider: -Levemir 6 units bid and adjust as needed Discussed with Estill Batten pharmacist.  Thank you, Billy Fischer. Loring Liskey, RN, MSN, CDE  Diabetes Coordinator Inpatient Glycemic Control Team Team Pager [phone] (8am-5pm) 09/11/2021 11:03 AM

## 2021-09-11 NOTE — Progress Notes (Signed)
Bite block placed do to patient repeatedly biting down on tube

## 2021-09-11 NOTE — Progress Notes (Signed)
Progress Note  1 Day Post-Op  Subjective: Patient intubated/sedated. Son, brother, and RN bedside. No further bloody bowel movements   Objective: Vital signs in last 24 hours: Temp:  [96.8 F (36 C)-98.4 F (36.9 C)] 98.1 F (36.7 C) (09/07 0730) Pulse Rate:  [58-89] 59 (09/07 0730) Resp:  [18] 18 (09/07 0645) BP: (128-155)/(60-78) 143/73 (09/06 1753) SpO2:  [94 %-100 %] 100 % (09/07 0757) Arterial Line BP: (103-162)/(48-76) 137/65 (09/07 0730) FiO2 (%):  [30 %-40 %] 30 % (09/07 0757) Weight:  [155.6 kg] 155.6 kg (09/07 0400) Last BM Date : 09/10/21  Intake/Output from previous day: 09/06 0701 - 09/07 0700 In: 2622.2 [I.V.:372.4; LF:3932325.7; NG/GT:322.1; IV Piggyback:605.1] Out: [id number] [Urine:1140; Drains:340] Intake/Output this shift: Total I/O In: 21.4 [I.V.:21.4] Out: 115   PE: General: intubated. sedated Heart: NSR Lungs: no respiratory distress on mechanical ventilation Abd: soft MSK: all 4 extremities are symmetrical with no cyanosis, clubbing, or edema. Skin: warm and dry with no masses, lesions, or rashes  Lab Results:  Recent Labs    09/10/21 1807 09/10/21 2205 09/11/21 0259  WBC 17.2*  --  18.5*  HGB 10.4* 10.1* 10.6*  HCT 29.5* 28.9* 30.5*  PLT 47*  --  47*   BMET Recent Labs    09/10/21 1527 09/10/21 1713 09/11/21 0259  NA 140 141 139  K 4.1 4.2 4.0  CL 105 105 107  CO2 26  --  24  GLUCOSE 263* 244* 139*  BUN 34* 32* 30*  CREATININE 1.02 0.90 0.85  CALCIUM 7.1*  --  6.9*   PT/INR No results for input(s): "LABPROT", "INR" in the last 72 hours. CMP     Component Value Date/Time   NA 139 09/11/2021 0259   K 4.0 09/11/2021 0259   CL 107 09/11/2021 0259   CO2 24 09/11/2021 0259   GLUCOSE 139 (H) 09/11/2021 0259   BUN 30 (H) 09/11/2021 0259   CREATININE 0.85 09/11/2021 0259   CREATININE 0.90 03/31/2017 1625   CALCIUM 6.9 (L) 09/11/2021 0259   PROT 3.2 (L) 09/07/2021 2054   ALBUMIN <1.5 (L) 09/11/2021 0259   AST 33 09/07/2021  2054   ALT 32 09/07/2021 2054   ALKPHOS 124 09/07/2021 2054   BILITOT 0.5 09/07/2021 2054   GFRNONAA >60 09/11/2021 0259   GFRAA >90 04/07/2011 0927   Lipase  No results found for: "LIPASE"     Studies/Results: DG Abd Portable 1V  Result Date: 09/10/2021 CLINICAL DATA:  Feeding tube placement. EXAM: PORTABLE ABDOMEN - 1 VIEW COMPARISON:  Abdominal CT earlier today. FINDINGS: Enteric tube is looped in the stomach, tip in the region of the gastric cardia. Right upper quadrant cholecystostomy tube grossly stable in position. IMPRESSION: Enteric tube looped in the stomach, tip in the region of the gastric cardia. Electronically Signed   By: Keith Rake M.D.   On: 09/10/2021 19:14   CT Angio Abd/Pel w/ and/or w/o  Result Date: 09/10/2021 CLINICAL DATA:  History proximal small-bowel bleed, post endoscopic clipping and percutaneous coil embolization with persistent drop in hematocrit. Patient also with history of concomitant acute cholecystitis, post cholecystostomy tube. EXAM: CTA ABDOMEN AND PELVIS WITHOUT AND WITH CONTRAST TECHNIQUE: Multidetector CT imaging of the abdomen and pelvis was performed using the standard protocol during bolus administration of intravenous contrast. Multiplanar reconstructed images and MIPs were obtained and reviewed to evaluate the vascular anatomy. RADIATION DOSE REDUCTION: This exam was performed according to the departmental dose-optimization program which includes automated exposure control, adjustment of the  mA and/or kV according to patient size and/or use of iterative reconstruction technique. CONTRAST:  OMNIPAQUE IOHEXOL 350 MG/ML SOLN COMPARISON:  CTA abdomen and pelvis-09/07/2021; 09/06/2021; 09/05/2021 Mesenteric arteriogram and percutaneous coil embolization-09/06/2021 Mesenteric arteriogram-09/07/2021 Image guided cholecystostomy tube placement-09/07/2021 FINDINGS: VASCULAR Aorta: Normal caliber of the abdominal aorta. No evidence of abdominal aortic  dissection or perivascular stranding. Celiac: Tortuosity involving the origin of the celiac artery, not definitely resulting in hemodynamically significant stenosis. SMA: Widely patent without hemodynamically significant narrowing. A replaced right hepatic artery is noted to arise from the proximal SMA. The distal tributaries of the SMA appear widely patent without discrete lumen filling defect to suggest distal embolism. No discrete areas of intraluminal contrast extravasation are identified with special attention paid to the area of previously noted GI bleeding demonstrated on CTA performed 09/06/2021. Renals: The left renal artery is duplicated with tiny accessory left renal artery which supplies the inferior pole of the left kidney. Both dominant renal arteries are widely patent without hemodynamically significant narrowing. No vessel irregularity to suggest FMD. IMA: Widely patent without hemodynamically significant narrowing. Inflow: The bilateral common, external and internal iliac arteries are of normal caliber and widely patent without a hemodynamically significant narrowing. Proximal Outflow: The bilateral common and imaged portions of the bilateral deep and superficial femoral arteries are widely patent without hemodynamically significant narrowing. Veins: The IVC and pelvic venous systems appear widely patent. Review of the MIP images confirms the above findings. _________________________________________________________ Evaluation abdominal organs is degraded due to patient body habitus and associated quantum mottle artifact. NON-VASCULAR Lower chest: Limited visualization of the lower thorax demonstrates bibasilar consolidative opacities and associated air bronchograms within the imaged bilateral lower lobes. Cardiomegaly. Coronary artery calcifications. There is diffuse decreased attenuation of the intra cardiac blood pool suggestive of anemia. Trace amount of pericardial fluid, presumably physiologic.  Hepatobiliary: Normal hepatic contour. Appropriately positioned cholecystostomy tube with decompression of the gallbladder and resolution of previously noted gallbladder wall thickening or pericholecystic stranding. There is a persistent approximately 1.2 cm radiopaque gallstone within the neck of the gallbladder (image 4, series 4). No intra or extrahepatic biliary ductal dilatation. No ascites. Pancreas: Normal appearance of the pancreas. Spleen: Normal appearance of the spleen. Adrenals/Urinary Tract: There is symmetric enhancement of the bilateral kidneys. Nonobstructing renal stones are seen bilaterally with index right-sided renal stone measuring 3 mm (image 132, series 24) and index left renal stone measuring 4 mm (image 151, series 24). There is an approximately 1.6 cm hypoattenuating lesion within the inferior pole of the right kidney (image 135, series 24), which may demonstrate enhancement though is incompletely evaluated on this examination. No discrete left-sided renal lesions. No scratched at there is a minimal amount of likely age and body habitus related perinephric stranding. No urinary obstruction. Normal appearance the bilateral adrenal glands. The urinary bladder is decompressed with a Foley catheter. Stomach/Bowel: Ingested radiopaque material is seen within the gastric fundus and proximal small bowel. Stable positioning of endoscopy clip and adjacent percutaneous coil. No discrete area active extravasation with special attention paid to the area extravasation involving the proximal jejunum demonstrated on CTA performed 09/06/2021. Endoscopy capsule seen within the rectum. Moderate liquid stool burden without evidence of enteric obstruction. Enteric tube terminates within the mid body of the stomach. No pneumoperitoneum, pneumatosis or portal venous gas Lymphatic: No bulky retroperitoneal, mesenteric, pelvic or inguinal lymphadenopathy. Reproductive: Normal appearance of the prostate gland.  Other: Diffuse body wall anasarca, most conspicuous about the bilateral flanks. Post ventral hernia abdominal repair  without evidence of recurrence. Musculoskeletal: No acute or aggressive osseous abnormalities. Stigmata of dish within the lower thoracic spine. Mild multilevel lumbar spine DDD, worse L1-L2 with disc space height loss, endplate irregularity and sclerosis. Mild degenerative change the bilateral hips with joint space loss, subchondral sclerosis and osteophytosis. IMPRESSION: 1. No discrete areas intraluminal contrast extravasation to suggest recurrent GI bleeding with special attention paid to the area of contrast extravasation demonstrated on preceding abdominal CTA performed 09/06/2021. 2. Appropriately positioned cholecystostomy tube with decompression of the gallbladder and residual cholelithiasis. 3. Bilateral lower lobe consolidative air space opacities and associated air bronchograms, atelectasis versus infiltrate. 4. Indeterminate approximately 1.6 cm right-sided renal lesion, incompletely evaluated on the present examination though may demonstrate enhancement. Further evaluation with nonemergent abdominal MRI is recommended. Electronically Signed   By: Sandi Mariscal M.D.   On: 09/10/2021 16:33   IR Angiogram Selective Each Additional Vessel  Result Date: 09/09/2021 INDICATION: GI bleed. Briefly, a 61 year old male with hematochezia. Push enteroscopy revealing fresh blood within jejunum. 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 Patient transferred from [facility] for definitive management. EXAM: Procedures: 1. CELIAC and SUPERIOR MESENTERIC ARTERIOGRAPHY 2. COIL EMBOLIZATION of SMA TERRITORY, JEJUNAL ARCADE BRANCH COMPARISON:  CTA AP, earlier same day. MEDICATIONS: 50 mg Benadryl IV.  4 mg Zofran. ANESTHESIA/SEDATION: Moderate (conscious) sedation was employed during this procedure. A total of Versed 1.5 mg and Fentanyl 75 mcg was administered intravenously. Moderate  Sedation Time: 80 minutes. The patient's level of consciousness and vital signs were monitored continuously by radiology nursing throughout the procedure under my direct supervision. CONTRAST:  75 mL Isovue-300. FLUOROSCOPY TIME:  Fluoroscopic dose; [id number] mGy COMPLICATIONS: None immediate. PROCEDURE: Informed consent was obtained from the patient and/or patient's representative following explanation of the procedure, risks, benefits and alternatives. All questions were addressed. A time out was performed prior to the initiation of the procedure. Maximal barrier sterile technique utilized including caps, mask, sterile gowns, sterile gloves, large sterile drape, hand hygiene, and chlorhexidine prep. At the RIGHT groin 1% lidocaine was used for local anesthesia. Limited ultrasound imaging of the groin shows the RIGHT femoral artery to be patent. An ultrasound image was saved and sent to PACS. Arterial access was obtained with a 21-G, 7-cm needle under direct ultrasound guidance, through which a 0.018-inch guidewire was advanced under fluoroscopy. The 21-G needle was then exchanged for a micropuncture catheter and a 0.035-inch Bentson guidewire. The micropuncture catheter was exchanged for a 5-F sheath. A 5 Fr cobra 2 catheter was used to catheterize the celiac axis then SMA through which a digital subtraction angiogram (DSA) was obtained. No intervention was performed at the celiac axis. The SMA was cannulated with the C2 catheter and glidewire, then using a STC microcatheter and 0.014 inch Fathom microwire access to the jejunal arcades was obtained and subselective DSA was performed. PURPOSE OF THE ARTERIOGRAM: No previous catheter-directed angiogram was available. Therefore a new complete diagnostic angiography was performed. The decision to proceed with an interventional procedure was made based on this new diagnostic angiogram. Super selective SMA territory jejunal arcade branch embolization was then performed using  micro coils. A final angiogram was performed. Images were reviewed and the procedure was terminated. All wires, catheters and sheaths were removed from the patient. Hemostasis was achieved at the RIGHT groin access site with Angio-Seal closure. The patient tolerated the procedure well without immediate post procedural complication. FINDINGS: 1. Normal caliber, order and branching of the celiac axis. 2. Incidental replaced RIGHT hepatic  artery originating from the SMA. 3. Active extravasation at SMA territory jejunal arcade branch at the level of the endoscopy clip, (see key image) successfully treated with super selective coil embolization. 4. Angio-Seal closure of the RIGHT groin with distal RIGHT lower extremity pulses of the end of the case. IMPRESSION: Successful super selective coil embolization of active extravasation from SMA territory jejunal arcade branch for upper GI bleed, as above. PLAN: 1. Post sheath removal precautions, including RIGHT lower extremity straight x2 hours. 2. Serial examinations and H/h trend to evaluate potential ongoing GI bleeding. Michaelle Birks, MD [facility] Electronically Signed   By: Michaelle Birks M.D.   On: 09/09/2021 14:27   ECHOCARDIOGRAM COMPLETE  Result Date: 09/09/2021    ECHOCARDIOGRAM REPORT   Patient Name:   Troy Randall Date of Exam: 09/09/2021 Medical Rec #:  [id number]       Height:       72.0 in Accession #:    [id number]      Weight:       344.1 lb Date of Birth:  [birth year]       BSA:          2.683 m Patient Age:    62 years        BP:           120/78 mmHg Patient Gender: M               HR:           95 bpm. Exam Location:  Inpatient Procedure: 2D Echo, Cardiac Doppler and Color Doppler Indications:    Acute respiratory distress  History:        Patient has prior history of Echocardiogram examinations, most                 recent 04/29/2021. Risk Factors:Hypertension, Diabetes and                  Dyslipidemia.  Sonographer:    Jefferey Pica Referring Phys: [phone] MURALI RAMASWAMY  Sonographer Comments: Unable to use Definity due to no IV access. IMPRESSIONS  1. Left ventricular ejection fraction, by estimation, is 65 to 70%. The left ventricle has hyperdynamic function. The left ventricle has no regional wall motion abnormalities. Left ventricular diastolic parameters are indeterminate.  2. Right ventricular systolic function is normal. The right ventricular size is normal. Tricuspid regurgitation signal is inadequate for assessing PA pressure.  3. The mitral valve is normal in structure. No evidence of mitral valve regurgitation.  4. The aortic valve is tricuspid. There is mild calcification of the aortic valve. There is mild thickening of the aortic valve. Aortic valve regurgitation is not visualized. Aortic valve sclerosis is present, with no evidence of aortic valve stenosis. Comparison(s): No significant change from prior study. Prior images reviewed side by side. FINDINGS  Left Ventricle: Left ventricular ejection fraction, by estimation, is 65 to 70%. The left ventricle has hyperdynamic function. The left ventricle has no regional wall motion abnormalities. The left ventricular internal cavity size was normal in size. Suboptimal image quality limits for assessment of left ventricular hypertrophy. Left ventricular diastolic parameters are indeterminate. Right Ventricle: The right ventricular size is normal. Right vetricular wall thickness was not well visualized. Right ventricular systolic function is normal. Tricuspid regurgitation signal is inadequate for assessing PA pressure. Left Atrium: Left atrial size was normal in size. Right Atrium: Right atrial size was normal in size. Pericardium: There is no  evidence of pericardial effusion. Mitral Valve: The mitral valve is normal in structure. No evidence of mitral valve regurgitation. Tricuspid Valve: The tricuspid valve is normal in structure.  Tricuspid valve regurgitation is not demonstrated. Aortic Valve: The aortic valve is tricuspid. There is mild calcification of the aortic valve. There is mild thickening of the aortic valve. Aortic valve regurgitation is not visualized. Aortic valve sclerosis is present, with no evidence of aortic valve stenosis. Aortic valve peak gradient measures 13.0 mmHg. Pulmonic Valve: The pulmonic valve was not well visualized. Aorta: The aortic root was not well visualized. Venous: The inferior vena cava was not well visualized. IAS/Shunts: The interatrial septum was not well visualized.   Diastology LV e' medial:    4.73 cm/s LV E/e' medial:  12.7 LV e' lateral:   5.40 cm/s LV E/e' lateral: 11.1  LEFT ATRIUM             Index        RIGHT ATRIUM           Index LA Vol (A2C):   32.9 ml 12.26 ml/m  RA Area:     12.70 cm LA Vol (A4C):   35.4 ml 13.19 ml/m  RA Volume:   24.50 ml  9.13 ml/m LA Biplane Vol: 35.5 ml 13.23 ml/m  AORTIC VALVE              PULMONIC VALVE AV Vmax:      180.50 cm/s PV Vmax:       0.84 m/s AV Peak Grad: 13.0 mmHg   PV Peak grad:  2.8 mmHg LVOT Vmax:    142.00 cm/s LVOT Vmean:   95.200 cm/s LVOT VTI:     0.226 m MITRAL VALVE               TRICUSPID VALVE MV Area (PHT): 3.72 cm    TR Peak grad:   8.8 mmHg MV Decel Time: 204 msec    TR Vmax:        148.00 cm/s MV E velocity: 60.00 cm/s MV A velocity: 53.60 cm/s  SHUNTS MV E/A ratio:  1.12        Systemic VTI: 0.23 m Rachelle Hora Croitoru MD Electronically signed by Thurmon Fair MD Signature Date/Time: 09/09/2021/11:54:05 AM    Final     Anti-infectives: Anti-infectives (From admission, onward)    Start     Dose/Rate Route Frequency Ordered Stop   09/08/21 0200  piperacillin-tazobactam (ZOSYN) IVPB 3.375 g  Status:  Discontinued        3.375 g 12.5 mL/hr over 240 Minutes Intravenous Every 8 hours 09/07/21 1856 09/07/21 2114   09/07/21 2200  meropenem (MERREM) 1 g in sodium chloride 0.9 % 100 mL IVPB        1 g 200 mL/hr over 30 Minutes Intravenous  Every 8 hours 09/07/21 2114     09/07/21 1945  piperacillin-tazobactam (ZOSYN) IVPB 3.375 g        3.375 g 100 mL/hr over 30 Minutes Intravenous  Once 09/07/21 1856 09/07/21 2005   09/07/21 1753  cefTRIAXone (ROCEPHIN) injection         Intravenous As needed 09/07/21 1754 09/07/21 1753   09/07/21 1749  sodium chloride 0.9 % with cefTRIAXone (ROCEPHIN) ADS Med       Note to Pharmacy: Harlow Asa E: cabinet override      09/07/21 1749 09/07/21 1935        Assessment/Plan GI Bleed ABL Anemia - s/p endo Dr. Levon Hedger 9/2 with  portion of the proximal jejunum with evidence of an area that had intermittent blood pumping that was not able to be endoscopy controlled but was tattooed for marking purposes - s/p IR mesenteric arteriography, coil emobolization of SMA territory, jejunal arcade branch on 9/2 - CTA/post mesenteric arteriogram 9/3 with was negative for any ongoing bleeding.  - s/p SB enteroscopy 9/6 Dr. Benson Norway with findings of non bleeding jejunal ulcers with a clean ulcer base which were biopsied - Hgb now 10.6 s/p 4 units prbc 9/6. No further bloody bowel movements. Hopefully bleeding is resolved and would recommend repeat imaging or endoscopic evaluation should bleeding recur given he is a high risk surgical candidate   Cholecystitis - Noted on imaging 9/3 and IR placed perc chole drain on same date - Cx's with E. Coli. On abx   FEN -IVF per CCM/Neprho VTE - SCDs, on hold for anemia ID - Merrem Foley - in place, strict I/O   -Per CCM- Acute Resp Failure on Vent  Shock on pressors AKI on CRRT Hx HTN Hx HLD Dm2 Hx CVA on Plavix prior to admission  I reviewed Consultant GI notes, last 24 h vitals and pain scores, last 48 h intake and output, last 24 h labs and trends, and last 24 h imaging results.    LOS: 7 days   [facility] 09/11/2021, 9:14 AM Please see Amion for pager number during day hours 7:00am-4:30pm

## 2021-09-11 NOTE — Progress Notes (Signed)
NAME:  Troy Randall, MRN:  [id number], DOB:  03-06-1960, LOS: 7 ADMISSION DATE:  09/04/2021, CONSULTATION DATE:  09/06/21 REFERRING MD:  [name], CHIEF COMPLAINT:  melena   History of Present Illness:   Troy Randall is an 61 y.o. male with prior history of HTN, HLD, DM, and CVA on ASA/ plavix who presented to [facility] on 8/31 after developing frequent 6-7 dark stools and feeling dizzy and lightheaded on standing.  No hx of NSAIDs.  FOBT positive and initial Hgb 12.2.    Patient admitted to [facility] with GI consulting.  Remained orthostatic with progressive decline in Hgb.  CTA abdomen did not show any active bleed, therefore underwent colonoscopy > no active bleed found but large clots throughout entire colon.  Then underwent EGD which showed a small hiatal hernia, erosive gastropathy with no stigmata of recent bleeding, and non-bleeding duodenal diverticulum.   Underwent then small bowel enteroscopy which noted intermittent blood pumping in the proximal jejunum but was unable to be reached despite multiple attempts, was injected and clip placed.  Repeat CTA repeated, which showed positive jejunal diverticular bleeding. Patient was transferred to [facility] for IR involvement. In IR active extravasation  at SMA territory, jejunal arcade branch, successful coil embolization.  PCCM was consulted for transfer and admission.  Pertinent  Medical History  HTN, HLD, DM, CVA on ASA/ plavix  Significant Hospital Events: Including procedures, antibiotic start and stop dates in addition to other pertinent events   8/31 presented to [facility], [name] Admit, Gi Consult 9/1 CTA> no evidence of active GI bleed, 2 U PRBC 9/2 2 U PRBC, colonoscopy > no active bleed found but large clots throughout entire colon. EGD> small hiatal hernia, erosive gastropathy with no stigmata of recent bleeding, and non-bleeding duodenal diverticulum.   Underwent then small bowel enteroscopy which noted intermittent blood pumping in the proximal jejunum  but was unable to be reached despite multiple attempts, was injected and clip placed. Repeat CTA repeated, which showed positive jejunal diverticular bleeding. IR> active extrav at [facility] territory, jejunal arcade branch, successful coil embo 9/3 Intubated. CTA> no evidence of active GI bleeding.  New finding of abnormal gallbladder with nondependent air, gallbladder wall air, pericholecystic and right upper quadrant information.  IR placed percutaneous cholecystotomy tube.  9/4 R. IJ central line placed with dialysis triple lumen catheter 9/5 CRRT initiated 9/6 Bedside EGD without any evidence of bleeding. CTA a/p without etiology for bleed Interim History / Subjective:  Vital signs remained stable off of all pressors. He has not had any further bloody bowel movements.   Objective   Blood pressure (!) 143/73, pulse (!) 59, temperature 98.1 F (36.7 C), resp. rate 18, height 6' (1.829 m), weight (!) 155.6 kg, SpO2 100 %. Vent Mode: PRVC FiO2 (%):  [40 %] 40 % Set Rate:  [18 bmp] 18 bmp Vt Set:  [620 mL] 620 mL PEEP:  [5 cmH20] 5 cmH20 Plateau Pressure:  [19 cmH20-22 cmH20] 19 cmH20   Intake/Output Summary (Last 24 hours) at 09/11/2021 0751 Last data filed at 09/11/2021 0700 Gross per 24 hour  Intake 2622.21 ml  Output 3856 ml  Net -1233.79 ml    Filed Weights   09/09/21 0500 09/10/21 0500 09/11/21 0400  Weight: (!) 156.1 kg (!) 155.8 kg (!) 155.6 kg   Examination: Gen: Sedated, in no distress HEENT: intubated, PERRL Lungs: course breath sounds, in no respiratory distress CV:   tachycardic, regular Abd:  Obese, soft. Percutaneous cholecystostomy tube in place  Ext: Trace edema in lower extremities. Skin: Warm and dry; no rashes.  Neuro: Sedated, not following commands  Resolved Hospital Problem list     Assessment & Plan:   Hemorrhagic shock ABLA secondary to lower GI bleed, likely diverticular Acute blood loss anemia, secondary to above - s/p coil embolization of the jejunal  branch of SMA 9/2 and bedside EGD 9/6 Has had significant improvement overnight, no evidence of acute hemorrhage on bedside EGD. Likely that embolization has taken full effect and hopeful bleeding has ceased at this point. Vital signs and MAP's have improved and is no longer on any pressors. Hgb stable at 10 this morning. Appreciate the collaboration of GI, general surgery, and IR yesterday.  -Continue to monitor for GI bleed -Continue IV PPI, no evidence of gastric or duodenal ulcer on recent EGD -Daily CBC unless has additional BM -Transfuse PRBC if HBG less than 7 -Hold tube feeds and constipation medications today  Mixed Shock - Septic and hemorrhagic In setting of cholecystitis and lower GI bleed. Significant improvement and believe shock has resolved at this time. Will continue to monitor any changes with his severe illness. Will add back pressors if needed for him to tolerate CRRT, but thus far has done well.  -Continues vasopressin and levophed wean as able -Discontinue stress dose steroids -a-line in place  Septic shock Cholecystitis s/p percutaneous cholecystostomy drain placement 9/3 percutaneous cholecystostomy drain placed.  Minimal output. Remained afebrile and continued leukocytosis likely secondary to steroids. Cultures positive for gram-positive and gram-negative organisms with PMNs, final results pan-sensitive with the exception of ampicillin. Will transition to ceftriaxone and complete 10 day course of abx. Blood cultures continue to be negative -Continue meropenem day 5, transition to ceftriaxone today for 10 day total course -Continue to monitor drain -Trend leukocytosis and monitor for continued fevers -Will need outpatient surgery follow up  Oliguric AKI ATN 2/2 mixed shock Hypocalcemia Bilateral nonobstructing nephrolithiasis - suspect chronic BPH Baseline 4 months ago around 1.0. Cr is at baseline on CRRT. He has made 1L of urine overnight. Appreciate Dr. Roni Bread  assistance.  -Appreciate nephrologies assistance -Continue CRRT -Trend electrolytes, replete as needed. Received IV calcium yesterday evening, will give additional dose -Metabolic acidosis has resolved -Hold flomax -Foley catheter in place, strict i/o  Thrombocytopenia Platelets dropped to 47 from 67. Blood smear with unremarkable morphology, but low count. Likely in setting of acute illness. Low suspicion for HIT.  -Trend with daily CBC  Acute hypoxemic respiratory failure Likely in setting of acute illness, shock, and hypervolemia as per above. Will continue to monitor on PRVC and initiate weaning if does well. RR 18, Tv 620, PEEP 5, peak 23, Fio2 40.  -lung protected ventilation -VAP protocol -daily SBT/SAT -continue fentanyl as need for sedation, RASS goal of 0  History of CVA -hold asa plavix in setting of acute GI bleed -monitor neuro exam per unit protocol.  DM2 Hold tube feeds for another and allow for bowel rest. Will adjust regimen as needed.  -Blood Glucose goal 140-180. -SSI, semglee per pharmacy  HTN HLD -Hold home antihypertensives and statin  Pulmonary nodules in bilateral bases, L 49mm Family reports patient is a never smoker. Family does endorse dipping. -no further follow up needed for a low risk patient based on fleischner criteria  Best Practice (right click and "Reselect all SmartList Selections" daily)   Diet/type: NPO, stopping tube feeds DVT prophylaxis: SCD GI prophylaxis: PPI Lines: a-line, foley, R. IJ central line Foley:  yes Code Status:  full code Last date of multidisciplinary goals of care discussion [Full scope- Discussed with wife [name] 9/2 . Family at bedside and updated on plan for today.   Thalia Bloodgood DO  Internal Medicine Resident PGY-3 [place]  Pager: [phone]

## 2021-09-11 NOTE — Plan of Care (Signed)
  Problem: Clinical Measurements: Goal: Ability to maintain clinical measurements within normal limits will improve Outcome: Progressing Goal: Diagnostic test results will improve Outcome: Progressing Goal: Respiratory complications will improve Outcome: Progressing   

## 2021-09-11 NOTE — Progress Notes (Signed)
Subjective: No acute events.  No further evidence of bleeding.  Objective: Vital signs in last 24 hours: Temp:  [96.8 F (36 C)-98.2 F (36.8 C)] 98.1 F (36.7 C) (09/07 1230) Pulse Rate:  [58-89] 60 (09/07 1230) Resp:  [18] 18 (09/07 1230) BP: (128-155)/(64-78) 143/73 (09/06 1753) SpO2:  [94 %-100 %] 100 % (09/07 1230) Arterial Line BP: (95-185)/(54-81) 154/69 (09/07 1230) FiO2 (%):  [30 %-40 %] 30 % (09/07 1150) Weight:  [155.6 kg] 155.6 kg (09/07 0400) Last BM Date : 09/10/21  Intake/Output from previous day: 09/06 0701 - 09/07 0700 In: 2622.2 [I.V.:372.4; KY:3777404.7; NG/GT:322.1; IV Piggyback:605.1] Out: 3856 [Urine:1140; Drains:340] Intake/Output this shift: Total I/O In: 311.9 [I.V.:69.3; IV Piggyback:242.7] Out: 815 [Urine:125]  General appearance: intubated and sedated Resp: clear to auscultation bilaterally Cardio: regular rate and rhythm GI: soft, non-tender; bowel sounds normal; no masses,  no organomegaly  Lab Results: Recent Labs    09/10/21 0522 09/10/21 1047 09/10/21 1807 09/10/21 2205 09/11/21 0259  WBC 16.7*  --  17.2*  --  18.5*  HGB 6.8*   < > 10.4* 10.1* 10.6*  HCT 20.3*   < > 29.5* 28.9* 30.5*  PLT 67*  --  47*  --  47*   < > = values in this interval not displayed.   BMET Recent Labs    09/10/21 0159 09/10/21 1527 09/10/21 1713 09/11/21 0259  NA 138 140 141 139  K 4.2 4.1 4.2 4.0  CL 108 105 105 107  CO2 25 26  --  24  GLUCOSE 295* 263* 244* 139*  BUN 36* 34* 32* 30*  CREATININE 1.27* 1.02 0.90 0.85  CALCIUM 6.7* 7.1*  --  6.9*   LFT Recent Labs    09/11/21 0259  ALBUMIN <1.5*   PT/INR No results for input(s): "LABPROT", "INR" in the last 72 hours. Hepatitis Panel No results for input(s): "HEPBSAG", "HCVAB", "HEPAIGM", "HEPBIGM" in the last 72 hours. C-Diff No results for input(s): "CDIFFTOX" in the last 72 hours. Fecal Lactopherrin No results for input(s): "FECLLACTOFRN" in the last 72 hours.  Studies/Results: DG  Abd Portable 1V  Result Date: 09/10/2021 CLINICAL DATA:  Feeding tube placement. EXAM: PORTABLE ABDOMEN - 1 VIEW COMPARISON:  Abdominal CT earlier today. FINDINGS: Enteric tube is looped in the stomach, tip in the region of the gastric cardia. Right upper quadrant cholecystostomy tube grossly stable in position. IMPRESSION: Enteric tube looped in the stomach, tip in the region of the gastric cardia. Electronically Signed   By: Keith Rake M.D.   On: 09/10/2021 19:14   CT Angio Abd/Pel w/ and/or w/o  Result Date: 09/10/2021 CLINICAL DATA:  History proximal small-bowel bleed, post endoscopic clipping and percutaneous coil embolization with persistent drop in hematocrit. Patient also with history of concomitant acute cholecystitis, post cholecystostomy tube. EXAM: CTA ABDOMEN AND PELVIS WITHOUT AND WITH CONTRAST TECHNIQUE: Multidetector CT imaging of the abdomen and pelvis was performed using the standard protocol during bolus administration of intravenous contrast. Multiplanar reconstructed images and MIPs were obtained and reviewed to evaluate the vascular anatomy. RADIATION DOSE REDUCTION: This exam was performed according to the departmental dose-optimization program which includes automated exposure control, adjustment of the mA and/or kV according to patient size and/or use of iterative reconstruction technique. CONTRAST:  13mL OMNIPAQUE IOHEXOL 350 MG/ML SOLN COMPARISON:  CTA abdomen and pelvis-09/07/2021; 09/06/2021; 09/05/2021 Mesenteric arteriogram and percutaneous coil embolization-09/06/2021 Mesenteric arteriogram-09/07/2021 Image guided cholecystostomy tube placement-09/07/2021 FINDINGS: VASCULAR Aorta: Normal caliber of the abdominal aorta. No evidence of abdominal  aortic dissection or perivascular stranding. Celiac: Tortuosity involving the origin of the celiac artery, not definitely resulting in hemodynamically significant stenosis. SMA: Widely patent without hemodynamically significant  narrowing. A replaced right hepatic artery is noted to arise from the proximal SMA. The distal tributaries of the SMA appear widely patent without discrete lumen filling defect to suggest distal embolism. No discrete areas of intraluminal contrast extravasation are identified with special attention paid to the area of previously noted GI bleeding demonstrated on CTA performed 09/06/2021. Renals: The left renal artery is duplicated with tiny accessory left renal artery which supplies the inferior pole of the left kidney. Both dominant renal arteries are widely patent without hemodynamically significant narrowing. No vessel irregularity to suggest FMD. IMA: Widely patent without hemodynamically significant narrowing. Inflow: The bilateral common, external and internal iliac arteries are of normal caliber and widely patent without a hemodynamically significant narrowing. Proximal Outflow: The bilateral common and imaged portions of the bilateral deep and superficial femoral arteries are widely patent without hemodynamically significant narrowing. Veins: The IVC and pelvic venous systems appear widely patent. Review of the MIP images confirms the above findings. _________________________________________________________ Evaluation abdominal organs is degraded due to patient body habitus and associated quantum mottle artifact. NON-VASCULAR Lower chest: Limited visualization of the lower thorax demonstrates bibasilar consolidative opacities and associated air bronchograms within the imaged bilateral lower lobes. Cardiomegaly. Coronary artery calcifications. There is diffuse decreased attenuation of the intra cardiac blood pool suggestive of anemia. Trace amount of pericardial fluid, presumably physiologic. Hepatobiliary: Normal hepatic contour. Appropriately positioned cholecystostomy tube with decompression of the gallbladder and resolution of previously noted gallbladder wall thickening or pericholecystic stranding.  There is a persistent approximately 1.2 cm radiopaque gallstone within the neck of the gallbladder (image 4, series 4). No intra or extrahepatic biliary ductal dilatation. No ascites. Pancreas: Normal appearance of the pancreas. Spleen: Normal appearance of the spleen. Adrenals/Urinary Tract: There is symmetric enhancement of the bilateral kidneys. Nonobstructing renal stones are seen bilaterally with index right-sided renal stone measuring 3 mm (image 132, series 24) and index left renal stone measuring 4 mm (image 151, series 24). There is an approximately 1.6 cm hypoattenuating lesion within the inferior pole of the right kidney (image 135, series 24), which may demonstrate enhancement though is incompletely evaluated on this examination. No discrete left-sided renal lesions. No scratched at there is a minimal amount of likely age and body habitus related perinephric stranding. No urinary obstruction. Normal appearance the bilateral adrenal glands. The urinary bladder is decompressed with a Foley catheter. Stomach/Bowel: Ingested radiopaque material is seen within the gastric fundus and proximal small bowel. Stable positioning of endoscopy clip and adjacent percutaneous coil. No discrete area active extravasation with special attention paid to the area extravasation involving the proximal jejunum demonstrated on CTA performed 09/06/2021. Endoscopy capsule seen within the rectum. Moderate liquid stool burden without evidence of enteric obstruction. Enteric tube terminates within the mid body of the stomach. No pneumoperitoneum, pneumatosis or portal venous gas Lymphatic: No bulky retroperitoneal, mesenteric, pelvic or inguinal lymphadenopathy. Reproductive: Normal appearance of the prostate gland. Other: Diffuse body wall anasarca, most conspicuous about the bilateral flanks. Post ventral hernia abdominal repair without evidence of recurrence. Musculoskeletal: No acute or aggressive osseous abnormalities. Stigmata  of dish within the lower thoracic spine. Mild multilevel lumbar spine DDD, worse L1-L2 with disc space height loss, endplate irregularity and sclerosis. Mild degenerative change the bilateral hips with joint space loss, subchondral sclerosis and osteophytosis. IMPRESSION: 1. No discrete areas intraluminal  contrast extravasation to suggest recurrent GI bleeding with special attention paid to the area of contrast extravasation demonstrated on preceding abdominal CTA performed 09/06/2021. 2. Appropriately positioned cholecystostomy tube with decompression of the gallbladder and residual cholelithiasis. 3. Bilateral lower lobe consolidative air space opacities and associated air bronchograms, atelectasis versus infiltrate. 4. Indeterminate approximately 1.6 cm right-sided renal lesion, incompletely evaluated on the present examination though may demonstrate enhancement. Further evaluation with nonemergent abdominal MRI is recommended. Electronically Signed   By: Simonne Come M.D.   On: 09/10/2021 16:33    Medications: Scheduled:  sodium chloride   Intravenous Once   Chlorhexidine Gluconate Cloth  6 each Topical Daily   feeding supplement (PROSource TF20)  60 mL Per Tube 5 X Daily   fentaNYL (SUBLIMAZE) injection  50 mcg Intravenous Once   insulin aspart  0-20 Units Subcutaneous Q4H   insulin glargine-yfgn  15 Units Subcutaneous Daily   mupirocin ointment  1 Application Nasal BID   mouth rinse  15 mL Mouth Rinse Q2H   pantoprazole (PROTONIX) IV  40 mg Intravenous Q24H   sodium chloride flush  10-40 mL Intracatheter Q12H   sodium chloride flush  5 mL Intracatheter Q8H   Continuous:   prismasol BGK 4/2.5 400 mL/hr at 09/11/21 1150    prismasol BGK 4/2.5 400 mL/hr at 09/11/21 1150   sodium chloride 10 mL/hr at 09/11/21 1300   cefTRIAXone (ROCEPHIN)  IV Stopped (09/11/21 1134)   feeding supplement (VITAL 1.5 CAL) Stopped (09/10/21 0935)   fentaNYL infusion INTRAVENOUS 25 mcg/hr (09/11/21 1300)    norepinephrine (LEVOPHED) Adult infusion Stopped (09/10/21 1520)   phenylephrine (NEO-SYNEPHRINE) Adult infusion Stopped (09/09/21 1258)   prismasol BGK 4/2.5 1,500 mL/hr at 09/11/21 1105   sodium phosphate 15 mmol in dextrose 5 % 250 mL infusion 43 mL/hr at 09/11/21 1300   vasopressin Stopped (09/10/21 1211)    Assessment/Plan: 1) GI bleed from the jejunum. 2) Respiratory failure.   The patient appears to be improving.  In retrospect his bleeding was successfully treated with the coil embolization on 09/06/2021, however, the embolization did cause a secondary bleed as a result of ischemia.  This explains the segmental ulcerations found during the second enteroscopy yesterday, which appear to be healing.  The biopsies are still pending, however, the mucosa was normal during the first enteroscopy.  Plan: 1) Continue to monitor HGB and transfuse as necessary. 2) Continue with supportive care.  LOS: 7 days   Harding Thomure D 09/11/2021, 3:09 PM

## 2021-09-11 NOTE — Progress Notes (Signed)
[facility] Progress Note  Name: Troy Randall MRN: [id number] DOB: 02/17/1960  Chief Complaint:  Dark stools on presentation  Subjective:  Seen and examined on CRRT; procedure supervised.  He had 2.4 kg UF over 9/6 with CRRT.  He had 1.1 L UOP over 9/6.  He got PRBC's yesterday.  He had small bowel enteroscopy on 9/6 - patchy moderate inflammation in gastric body, few non-bleeding jejunal ulcers, as well as ulcerations and denuding in jejunum.  Reduced dialysate flow yesterday to 1.5 liters/hr.  He has been tolerating pulling net negative 50-100 ml/hr off.  He is off of pressors.  vent is down to 30% FIO2 and PEEP 5.    Review of systems:  Unable to obtain given intubated and sedated ------------- Background on referral:  Troy Randall is a 61 y.o. male with a history of hypertension, diabetes, nephrolithiasis, arthritis, and prior CVA who presented to the hospital with dark stools.  He had also complained of dizziness.  He had CTA which was without active bleed and had a colonoscopy without active bleed but clots noted throughout the entire colon per charting. Later underwent EGD with erosive gastropathy but no stigmata of bleeding.  Then had small bowel enteroscopy and later repeat CTA which was concerning for a bleed.  He was transferred from [facility] to [facility] and IR was consulted.  He underwent coil embolization. Per pulm he had hemorrhagic shock then developed septic shock.  Note he was also found to have cholecystitis and is s/p placement of a cholecystostomy tube.  He is on three pressors - levo, phenylephrine and vasopressin.  He has had respiratory failure and remains intubated. He has had worsening AKI and nephrology is consulted for assistance with management.  He had 400 mL uop over 9/3.    I spoke with his wife about the risks/benefits/indications for renal replacement therapy.  Primary team is at bedside placing a dialysis catheter.  The patient is on max levo at  40 mcg/min and phenylephrine at 400 mcg/min as well as bicarb gtt.  Team is hopeful that improving his acidosis may reduce his pressor requirement.     Intake/Output Summary (Last 24 hours) at 09/11/2021 0814 Last data filed at 09/11/2021 0800 Gross per 24 hour  Intake 2549.96 ml  Output 3602 ml  Net -1052.04 ml    Vitals:  Vitals:   09/11/21 0615 09/11/21 0630 09/11/21 0645 09/11/21 0730  BP:      Pulse: 65 (!) 59 (!) 59 (!) 59  Resp: 18 18 18    Temp: 98.1 F (36.7 C) 98.1 F (36.7 C) 98.1 F (36.7 C) 98.1 F (36.7 C)  TempSrc:    Esophageal  SpO2: 100% 99% 100% 100%  Weight:      Height:         Physical Exam:   General:  adult male critically ill   HEENT: NCAT Eyes: closed; does not track Neck: increased neck circumference; trachea midline Heart: s1S2 no rub Lungs: coarse mechanical breath sounds; on FIO2 30 and PEEP 5 Abdomen: soft/obese  Extremities: 1-2+ edema lower extremities  Neuro: sedation currently running Access - RIJ nontunneled catheter in place  Medications reviewed   Labs:     Latest Ref Rng & Units 09/11/2021    2:59 AM 09/10/2021    5:13 PM 09/10/2021    3:27 PM  BMP  Glucose 70 - 99 mg/dL 11/10/2021  626  948   BUN 6 - 20 mg/dL 30  32  34   Creatinine 0.61 - 1.24 mg/dL 6.96  7.89  3.81   Sodium 135 - 145 mmol/L 139  141  140   Potassium 3.5 - 5.1 mmol/L 4.0  4.2  4.1   Chloride 98 - 111 mmol/L 107  105  105   CO2 22 - 32 mmol/L 24   26   Calcium 8.9 - 10.3 mg/dL 6.9   7.1      Assessment/Plan:   # AKI  - secondary to ischemic ATN in the setting of multifactorial shock.  He has received clinically indicated contrast.  He was started on CRRT on 9/4 PM after nontunneled catheter placement with critical care to improve acidosis - Continue CRRT.  Reduced dialysate flow to 1.5 liters/hour - keep UF goal net neg 50 to 100 ml/hr as tolerated - spoke with primary team - anticipate transition off of CRRT tomorrow - 4K fluids     # Shock - mixed Septic and  hemorrhagic shock  - Pressors per primary team  - source control of GI bleed and infection as below - abx per primary team    # Acute hypoxic respiratory failure  - on mechanical ventilation per primary team  - optimize volume status with CRRT   # Acute GI bleed - s/p coil embolization  - s/p multiple studies and interventions as above - on PPI - transfusions per primary team - work-up per primary team      # Acute blood loss anemia  - transfusions per primary team    # Metabolic acidosis - secondary to AKI; as above - on CRRT    # Cholecystitis  - s/p cholecystomy drain placement  - abx per primary team   # Thrombocytopenia - no heparin with CRRT - worsening - work-up per primary team   # Hypophosphatemia - replete today and check BID labs  Disposition - continue ICU monitoring  Estanislado Emms, MD 09/11/2021 8:33 AM

## 2021-09-12 ENCOUNTER — Inpatient Hospital Stay (HOSPITAL_COMMUNITY): Payer: Medicare HMO

## 2021-09-12 DIAGNOSIS — E1110 Type 2 diabetes mellitus with ketoacidosis without coma: Secondary | ICD-10-CM | POA: Diagnosis not present

## 2021-09-12 DIAGNOSIS — K289 Gastrojejunal ulcer, unspecified as acute or chronic, without hemorrhage or perforation: Secondary | ICD-10-CM | POA: Diagnosis not present

## 2021-09-12 DIAGNOSIS — K625 Hemorrhage of anus and rectum: Secondary | ICD-10-CM | POA: Diagnosis not present

## 2021-09-12 DIAGNOSIS — N170 Acute kidney failure with tubular necrosis: Secondary | ICD-10-CM | POA: Diagnosis not present

## 2021-09-12 DIAGNOSIS — R0600 Dyspnea, unspecified: Secondary | ICD-10-CM | POA: Diagnosis not present

## 2021-09-12 DIAGNOSIS — J9601 Acute respiratory failure with hypoxia: Secondary | ICD-10-CM | POA: Diagnosis not present

## 2021-09-12 DIAGNOSIS — I10 Essential (primary) hypertension: Secondary | ICD-10-CM | POA: Diagnosis not present

## 2021-09-12 DIAGNOSIS — R579 Shock, unspecified: Secondary | ICD-10-CM | POA: Diagnosis not present

## 2021-09-12 DIAGNOSIS — Z4682 Encounter for fitting and adjustment of non-vascular catheter: Secondary | ICD-10-CM | POA: Diagnosis not present

## 2021-09-12 DIAGNOSIS — I639 Cerebral infarction, unspecified: Secondary | ICD-10-CM | POA: Diagnosis not present

## 2021-09-12 DIAGNOSIS — K922 Gastrointestinal hemorrhage, unspecified: Secondary | ICD-10-CM | POA: Diagnosis not present

## 2021-09-12 DIAGNOSIS — D62 Acute posthemorrhagic anemia: Secondary | ICD-10-CM | POA: Diagnosis not present

## 2021-09-12 DIAGNOSIS — D500 Iron deficiency anemia secondary to blood loss (chronic): Secondary | ICD-10-CM | POA: Diagnosis not present

## 2021-09-12 LAB — CBC WITH DIFFERENTIAL/PLATELET
Abs Immature Granulocytes: 0.4 10*3/uL — ABNORMAL HIGH (ref 0.00–0.07)
Band Neutrophils: 12 %
Basophils Absolute: 0 10*3/uL (ref 0.0–0.1)
Basophils Relative: 0 %
Eosinophils Absolute: 0.3 10*3/uL (ref 0.0–0.5)
Eosinophils Relative: 2 %
HCT: 30.8 % — ABNORMAL LOW (ref 39.0–52.0)
Hemoglobin: 10.2 g/dL — ABNORMAL LOW (ref 13.0–17.0)
Lymphocytes Relative: 2 %
Lymphs Abs: 0.3 10*3/uL — ABNORMAL LOW (ref 0.7–4.0)
MCH: 28.6 pg (ref 26.0–34.0)
MCHC: 33.1 g/dL (ref 30.0–36.0)
MCV: 86.3 fL (ref 80.0–100.0)
Monocytes Absolute: 0.7 10*3/uL (ref 0.1–1.0)
Monocytes Relative: 5 %
Myelocytes: 3 %
Neutro Abs: 12.1 10*3/uL — ABNORMAL HIGH (ref 1.7–7.7)
Neutrophils Relative %: 76 %
Platelets: 63 10*3/uL — ABNORMAL LOW (ref 150–400)
RBC: 3.57 MIL/uL — ABNORMAL LOW (ref 4.22–5.81)
RDW: 17.2 % — ABNORMAL HIGH (ref 11.5–15.5)
WBC: 13.8 10*3/uL — ABNORMAL HIGH (ref 4.0–10.5)
nRBC: 1.1 % — ABNORMAL HIGH (ref 0.0–0.2)
nRBC: 2 /100 WBC — ABNORMAL HIGH

## 2021-09-12 LAB — CULTURE, BLOOD (ROUTINE X 2)
Culture: NO GROWTH
Culture: NO GROWTH
Special Requests: ADEQUATE

## 2021-09-12 LAB — RENAL FUNCTION PANEL
Albumin: <1.5 g/dL — ABNORMAL LOW (ref 3.5–5.0)
Anion gap: 8 (ref 5–15)
BUN: 21 mg/dL — ABNORMAL HIGH (ref 6–20)
CO2: 26 mmol/L (ref 22–32)
Calcium: 7.1 mg/dL — ABNORMAL LOW (ref 8.9–10.3)
Chloride: 108 mmol/L (ref 98–111)
Creatinine, Ser: 0.75 mg/dL (ref 0.61–1.24)
GFR, Estimated: >60 mL/min (ref >60)
Glucose, Bld: 110 mg/dL — ABNORMAL HIGH (ref 70–99)
Phosphorus: 2.1 mg/dL — ABNORMAL LOW (ref 2.5–4.6)
Potassium: 3.6 mmol/L (ref 3.5–5.1)
Sodium: 142 mmol/L (ref 135–145)

## 2021-09-12 LAB — GLUCOSE, CAPILLARY
Glucose-Capillary: 105 mg/dL — ABNORMAL HIGH (ref 70–99)
Glucose-Capillary: 113 mg/dL — ABNORMAL HIGH (ref 70–99)
Glucose-Capillary: 139 mg/dL — ABNORMAL HIGH (ref 70–99)
Glucose-Capillary: 162 mg/dL — ABNORMAL HIGH (ref 70–99)
Glucose-Capillary: 169 mg/dL — ABNORMAL HIGH (ref 70–99)
Glucose-Capillary: 170 mg/dL — ABNORMAL HIGH (ref 70–99)

## 2021-09-12 LAB — AEROBIC/ANAEROBIC CULTURE W GRAM STAIN (SURGICAL/DEEP WOUND)

## 2021-09-12 LAB — SURGICAL PATHOLOGY

## 2021-09-12 LAB — MAGNESIUM: Magnesium: 2.4 mg/dL (ref 1.7–2.4)

## 2021-09-12 MED ORDER — MIDAZOLAM HCL 2 MG/2ML IJ SOLN: 2.0000 mg | Freq: Once | INTRAMUSCULAR | Status: AC

## 2021-09-12 MED ORDER — MIDAZOLAM HCL 2 MG/2ML IJ SOLN
INTRAMUSCULAR | Status: AC
  Administered 2021-09-12: 2 mg via INTRAVENOUS
  Filled 2021-09-12: qty 2

## 2021-09-12 MED ORDER — SODIUM PHOSPHATES 45 MMOLE/15ML IV SOLN
15.0000 mmol | Freq: Once | INTRAVENOUS | Status: AC
  Administered 2021-09-12: 15 mmol via INTRAVENOUS
  Filled 2021-09-12: qty 5

## 2021-09-12 MED ORDER — VITAL 1.5 CAL PO LIQD
1000.0000 mL | ORAL | Status: DC
  Administered 2021-09-14 – 2021-10-17 (×29): 1000 mL
  Filled 2021-09-12 (×28): qty 1000

## 2021-09-12 MED ORDER — PROSOURCE TF20 ENFIT COMPATIBL EN LIQD
60.0000 mL | Freq: Two times a day (BID) | ENTERAL | Status: DC
  Administered 2021-09-12 – 2021-10-22 (×75): 60 mL
  Filled 2021-09-12 (×78): qty 60

## 2021-09-12 MED ORDER — FUROSEMIDE 10 MG/ML IJ SOLN
60.0000 mg | Freq: Once | INTRAMUSCULAR | Status: AC
  Administered 2021-09-12: 60 mg via INTRAVENOUS
  Filled 2021-09-12: qty 6

## 2021-09-12 MED ORDER — INSULIN ASPART 100 UNIT/ML IJ SOLN
0.0000 [IU] | INTRAMUSCULAR | Status: DC
  Administered 2021-09-12 (×3): 3 [IU] via SUBCUTANEOUS
  Administered 2021-09-12 – 2021-09-13 (×2): 2 [IU] via SUBCUTANEOUS
  Administered 2021-09-13: 5 [IU] via SUBCUTANEOUS
  Administered 2021-09-13 (×3): 3 [IU] via SUBCUTANEOUS
  Administered 2021-09-14 (×3): 5 [IU] via SUBCUTANEOUS
  Administered 2021-09-14 (×2): 3 [IU] via SUBCUTANEOUS
  Administered 2021-09-14 (×2): 5 [IU] via SUBCUTANEOUS
  Administered 2021-09-15: 3 [IU] via SUBCUTANEOUS
  Administered 2021-09-15: 5 [IU] via SUBCUTANEOUS
  Administered 2021-09-15: 2 [IU] via SUBCUTANEOUS
  Administered 2021-09-15: 8 [IU] via SUBCUTANEOUS
  Administered 2021-09-16 – 2021-09-17 (×7): 3 [IU] via SUBCUTANEOUS
  Administered 2021-09-17: 2 [IU] via SUBCUTANEOUS
  Administered 2021-09-17: 3 [IU] via SUBCUTANEOUS
  Administered 2021-09-17 (×2): 2 [IU] via SUBCUTANEOUS
  Administered 2021-09-18: 5 [IU] via SUBCUTANEOUS
  Administered 2021-09-18 (×3): 3 [IU] via SUBCUTANEOUS
  Administered 2021-09-18: 2 [IU] via SUBCUTANEOUS
  Administered 2021-09-18 (×2): 3 [IU] via SUBCUTANEOUS
  Administered 2021-09-19 (×2): 5 [IU] via SUBCUTANEOUS

## 2021-09-12 NOTE — Progress Notes (Signed)
NAME:  Troy Randall, MRN:  [id number], DOB:  [birth year], LOS: 8 ADMISSION DATE:  09/04/2021, CONSULTATION DATE:  09/06/21 REFERRING MD:  [name], CHIEF COMPLAINT:  melena   History of Present Illness:   Troy Randall is an 61 y.o. male with prior history of HTN, HLD, DM, and CVA on ASA/ plavix who presented to [facility] on 8/31 after developing frequent 6-7 dark stools and feeling dizzy and lightheaded on standing.  No hx of NSAIDs.  FOBT positive and initial Hgb 12.2.    Patient admitted to [facility] with GI consulting.  Remained orthostatic with progressive decline in Hgb.  CTA abdomen did not show any active bleed, therefore underwent colonoscopy > no active bleed found but large clots throughout entire colon.  Then underwent EGD which showed a small hiatal hernia, erosive gastropathy with no stigmata of recent bleeding, and non-bleeding duodenal diverticulum.   Underwent then small bowel enteroscopy which noted intermittent blood pumping in the proximal jejunum but was unable to be reached despite multiple attempts, was injected and clip placed.  Repeat CTA repeated, which showed positive jejunal diverticular bleeding. Patient was transferred to [facility] for IR involvement. In IR active extravasation  at SMA territory, jejunal arcade branch, successful coil embolization.  PCCM was consulted for transfer and admission.  Pertinent  Medical History  HTN, HLD, DM, CVA on ASA/ plavix  Significant Hospital Events: Including procedures, antibiotic start and stop dates in addition to other pertinent events   8/31 presented to [facility], [name] Admit, Gi Consult 9/1 CTA> no evidence of active GI bleed, 2 U PRBC 9/2 2 U PRBC, colonoscopy > no active bleed found but large clots throughout entire colon. EGD> small hiatal hernia, erosive gastropathy with no stigmata of recent bleeding, and non-bleeding duodenal diverticulum.   Underwent then small bowel enteroscopy which noted intermittent blood pumping in the proximal jejunum  but was unable to be reached despite multiple attempts, was injected and clip placed. Repeat CTA repeated, which showed positive jejunal diverticular bleeding. IR> active extrav at [facility] territory, jejunal arcade branch, successful coil embo 9/3 Intubated. CTA> no evidence of active GI bleeding.  New finding of abnormal gallbladder with nondependent air, gallbladder wall air, pericholecystic and right upper quadrant information.  IR placed percutaneous cholecystotomy tube.  9/4 R. IJ central line placed with dialysis triple lumen catheter 9/5 CRRT initiated 9/6 Bedside EGD without any evidence of bleeding. CTA a/p without etiology for bleed Interim History / Subjective:  No acute events overnight, has remained off of pressors. Fentanyl stopped this morning. He has not had any blood bowel movements and continues to produce 1L of urine.   Objective   Blood pressure 118/72, pulse 73, temperature (!) 97.2 F (36.2 C), resp. rate 18, height 6' (1.829 m), weight (!) 154.7 kg, SpO2 99 %. Vent Mode: PRVC FiO2 (%):  [30 %] 30 % Set Rate:  [18 bmp] 18 bmp Vt Set:  [620 mL] 620 mL PEEP:  [5 cmH20] 5 cmH20 Plateau Pressure:  [17 cmH20-19 cmH20] 19 cmH20   Intake/Output Summary (Last 24 hours) at 09/12/2021 0707 Last data filed at 09/12/2021 0700 Gross per 24 hour  Intake 760.94 ml  Output 3516 ml  Net -2755.06 ml    Filed Weights   09/10/21 0500 09/11/21 0400 09/12/21 0500  Weight: (!) 155.8 kg (!) 155.6 kg (!) 154.7 kg   Examination: Gen: Sedated, in no distress HEENT: intubated, PERRL Lungs: course breath sounds, in no respiratory distress CV:   tachycardic,  regular Abd:  Obese, soft. Percutaneous cholecystostomy tube in place with dark brown drainage Ext: 1+ edema in upper and lower extremities. Anasarca Skin: Warm and dry; no rashes.  Neuro: Not following commands. Opens eyes to stimulus.   Resolved Hospital Problem list     Assessment & Plan:   Hemorrhagic shock ABLA secondary to  lower GI bleed, jejunal diverticular and ulcers secondary to ischemia Acute blood loss anemia, secondary to above - 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 and bedside EGD 9/6 No further bloody stools Likely that embolization has taken full effect and hopeful bleeding has ceased at this point. Vital signs and MAP's have been stable off of vasopressors. Hgb stable at 10. Appreciate the collaboration of GI, general surgery, and IR yesterday.  -Continue to monitor for GI bleed -Continue IV PPI, no evidence of gastric or duodenal ulcer on recent EGD -Daily CBC unless has additional BM -Transfuse PRBC if HBG less than 7 -Restart tube feeds today  Mixed Shock - Septic and hemorrhagic In setting of cholecystitis and lower GI bleed. Significant improvement and believe shock has resolved at this time. Will continue to monitor any changes with his severe illness.  -No longer requiring pressors -a-line in place  Septic shock Ecoli Cholecystitis s/p percutaneous cholecystostomy drain placement 9/3 percutaneous cholecystostomy drain placed. Dark brown output in drain.  Leukocytosis improving secondary to resolving infection and discontinuation of steroids.  Blood cultures continue to be negative-Continue ceftriaxone day 6, end date 09/17/21 -Continue to monitor drain -Trend leukocytosis and monitor for continued fevers -Will need outpatient surgery follow up  AKI-resolved ATN 2/2 mixed shock-resolved Hypocalcemia, hypophosphatemia Bilateral nonobstructing nephrolithiasis - suspect chronic BPH Baseline 4 months ago around 1.0.  CRRT discontinued this morning.  Cr at baseline and has responded well with 60 Lasix IV this morning.  -Appreciate nephrologies assistance -Discontinue CRRT, 60 mg IV Lasix this morning give additional dose this afternoon -Trend electrolytes, replete as needed.  Corrected calcium of 9.1 with hypoalbuminemia.  Phosphate repleted to -Metabolic acidosis has  resolved -Hold flomax -Foley catheter in place, strict i/o  Thrombocytopenia Platelets improved to 63 from 47.  Blood smear with unremarkable morphology, but low count. Likely in setting of acute illness. Low suspicion for HIT.  -Trend with daily CBC  Acute hypoxemic respiratory failure Likely in setting of acute illness, shock, and hypervolemia as per above. Will continue to monitor on PRVC and initiate weaning if does well. RR 18, Tv 620, PEEP 5, peak 23, Fio2 40.  -lung protected ventilation -VAP protocol -daily SBT/SAT -continue fentanyl as need for sedation, RASS goal of 0  Acute encephalopathy secondary to sepsis History of CVA without residual deficits CT head without contrast this morning as he is slow to respond off of sedatives. -hold asa plavix in setting of acute GI bleed -monitor neuro exam per unit protocol.  DM2 SSI and Semglee of 15 units.  Will adjust as needed with restarting tube feeds today. -Blood Glucose goal 140-180. -SSI, semglee per pharmacy  HTN HLD -Hold home antihypertensives and statin  Pulmonary nodules in bilateral bases, L 55mm Family reports patient is a never smoker. Family does endorse dipping. -no further follow up needed for a low risk patient based on fleischner criteria  Best Practice (right click and "Reselect all SmartList Selections" daily)   Diet/type: Restart tube feeds today DVT prophylaxis: SCD GI prophylaxis: PPI Lines: a-line, foley, R. IJ central line Foley:  yes Code Status:  full code Last date  of multidisciplinary goals of care discussion [Full scope- Discussed with wife [name] 9/2 . Family at bedside and updated on plan for today.   Thalia Bloodgood DO  Internal Medicine Resident PGY-3 [place]  Pager: [phone]

## 2021-09-12 NOTE — Inpatient Diabetes Management (Signed)
Inpatient Diabetes Program Recommendations  AACE/ADA: New Consensus Statement on Inpatient Glycemic Control (2015)  Target Ranges:  Prepandial:   less than 140 mg/dL      Peak postprandial:   less than 180 mg/dL (1-2 hours)      Critically ill patients:  140 - 180 mg/dL   Lab Results  Component Value Date   GLUCAP 113 (H) 09/12/2021   HGBA1C 6.2 (H) 04/29/2021    Review of Glycemic Control  Latest Reference Range & Units 09/11/21 23:07 09/12/21 03:11 09/12/21 07:09  Glucose-Capillary 70 - 99 mg/dL 622 (H) 633 (H) 354 (H)   Current orders for Inpatient glycemic control: Novolog 0-20 units Q4H, Semglee 15 units QD Solucortef 100 mg BID- complete  Inpatient Diabetes Program Recommendations:    Now that steroids complete and tube feeds remain on hold, would decrease correction to Novolog 0-9 units Q4H while on CRRT.   Thanks, Lujean Rave, MSN, RNC-OB Diabetes Coordinator [phone] (8a-5p)

## 2021-09-12 NOTE — Progress Notes (Signed)
Referring Physician(s): Dr. Silvestre Gunner  Supervising Physician: Mir, Mauri Reading  Patient Status:  [facility] - In-pt  Chief Complaint: Melena  Subjective: Remains intubated.  No further melena. Hgb stable at 10.  Perc chole tube remains in place.   Allergies: Metformin and related and Penicillins  Medications: Prior to Admission medications   Medication Sig Start Date End Date Taking? Authorizing Provider  amLODipine (NORVASC) 10 MG tablet Take 1 tablet (10 mg total) by mouth daily. 06/15/17  Yes Aliene Beams, MD  aspirin EC 81 MG tablet Take 81 mg by mouth daily. Swallow whole.   Yes [provider]  clopidogrel (PLAVIX) 75 MG tablet Take 1 tablet (75 mg total) by mouth daily. 04/30/21  Yes Azucena Fallen, MD  glipiZIDE (GLUCOTROL XL) 10 MG 24 hr tablet Take 1 tablet (10 mg total) by mouth daily with breakfast. 04/16/17  Yes Hagler, Fleet Contras, MD  levocetirizine (XYZAL) 5 MG tablet Take 1 tablet by mouth at bedtime.   Yes [provider]  lisinopril (PRINIVIL,ZESTRIL) 40 MG tablet Take 1 tablet (40 mg total) by mouth daily. 05/12/17  Yes Aliene Beams, MD  metFORMIN (GLUCOPHAGE-XR) 500 MG 24 hr tablet Take 500-1,000 mg by mouth in the morning and at bedtime. 02/09/21  Yes [provider]  rosuvastatin (CRESTOR) 40 MG tablet Take 1 tablet (40 mg total) by mouth daily. 04/29/21  Yes Azucena Fallen, MD  tamsulosin (FLOMAX) 0.4 MG CAPS capsule Take 0.4 mg by mouth daily. 02/12/21  Yes [provider]  traZODone (DESYREL) 150 MG tablet Take 150 mg by mouth at bedtime. 02/04/21  Yes [provider]  TRESIBA FLEXTOUCH 200 UNIT/ML FlexTouch Pen Inject 20 Units into the skin in the morning and at bedtime. 03/14/21  Yes [provider]  BD VEO INSULIN SYRINGE U/F 31G X 15/64" 1 ML MISC  USE AS DIRECTED 06/08/17   Aliene Beams, MD  glucose blood (ONETOUCH VERIO) test strip TEST twice a day 05/04/17   Aliene Beams, MD  Insulin Pen Needle  (NOVOTWIST) 32G X 5 MM MISC Use two daily to inject Victoza and Toujeo. 04/25/15   Reather Littler, MD  [facility] DELICA LANCETS 33G MISC Use to check blood sugar once a day dx code E11.65 11/21/14   Reather Littler, MD     Vital Signs: BP 110/66   Pulse 74   Temp (!) 97.5 F (36.4 C)   Resp 18   Ht 6' (1.829 m)   Wt (!) 341 lb 0.8 oz (154.7 kg)   SpO2 100%   BMI 46.25 kg/m   Physical Exam NAD, alert Abdomen: Distended. RUQ drain in place.  Bilious output in gravity bag.  Site intact, suture intact. Flushes and aspirates easily.   Imaging: CT HEAD WO CONTRAST ( )  Result Date: 09/12/2021 CLINICAL DATA:  Stroke follow-up EXAM: CT HEAD WITHOUT CONTRAST TECHNIQUE: Contiguous axial images were obtained from the base of the skull through the vertex without intravenous contrast. RADIATION DOSE REDUCTION: This exam was performed according to the departmental dose-optimization program which includes automated exposure control, adjustment of the mA and/or kV according to patient size and/or use of iterative reconstruction technique. COMPARISON:  04/28/2021 FINDINGS: Brain: Rapid increase in number of white matter insults which are scattered with rounded appearance. Have been prior basal ganglia infarcts based on previous study. Brain volume is normal. No cortical infarct, hemorrhage, hydrocephalus, or mass effect. Vascular: Unremarkable Skull: Negative Sinuses/Orbits: Negative IMPRESSION: 1. Multiple interval white matter insults which could be small  vessel infarcts or inflammatory process, suggest brain MRI with contrast. 2. Chronic small vessel disease. Electronically Signed   By: Tiburcio Pea M.D.   On: 09/12/2021 12:24   DG Abd Portable 1V  Result Date: 09/10/2021 CLINICAL DATA:  Feeding tube placement. EXAM: PORTABLE ABDOMEN - 1 VIEW COMPARISON:  Abdominal CT earlier today. FINDINGS: Enteric tube is looped in the stomach, tip in the region of the gastric cardia. Right upper quadrant cholecystostomy  tube grossly stable in position. IMPRESSION: Enteric tube looped in the stomach, tip in the region of the gastric cardia. Electronically Signed   By: Narda Rutherford M.D.   On: 09/10/2021 19:14   CT Angio Abd/Pel w/ and/or w/o  Result Date: 09/10/2021 CLINICAL DATA:  History proximal small-bowel bleed, post endoscopic clipping and percutaneous coil embolization with persistent drop in hematocrit. Patient also with history of concomitant acute cholecystitis, post cholecystostomy tube. EXAM: CTA ABDOMEN AND PELVIS WITHOUT AND WITH CONTRAST TECHNIQUE: Multidetector CT imaging of the abdomen and pelvis was performed using the standard protocol during bolus administration of intravenous contrast. Multiplanar reconstructed images and MIPs were obtained and reviewed to evaluate the vascular anatomy. RADIATION DOSE REDUCTION: This exam was performed according to the departmental dose-optimization program which includes automated exposure control, adjustment of the mA and/or kV according to patient size and/or use of iterative reconstruction technique. CONTRAST:  OMNIPAQUE IOHEXOL 350 MG/ML SOLN COMPARISON:  CTA abdomen and pelvis-09/07/2021; 09/06/2021; 09/05/2021 Mesenteric arteriogram and percutaneous coil embolization-09/06/2021 Mesenteric arteriogram-09/07/2021 Image guided cholecystostomy tube placement-09/07/2021 FINDINGS: VASCULAR Aorta: Normal caliber of the abdominal aorta. No evidence of abdominal aortic dissection or perivascular stranding. Celiac: Tortuosity involving the origin of the celiac artery, not definitely resulting in hemodynamically significant stenosis. SMA: Widely patent without hemodynamically significant narrowing. A replaced right hepatic artery is noted to arise from the proximal SMA. The distal tributaries of the SMA appear widely patent without discrete lumen filling defect to suggest distal embolism. No discrete areas of intraluminal contrast extravasation are identified with  special attention paid to the area of previously noted GI bleeding demonstrated on CTA performed 09/06/2021. Renals: The left renal artery is duplicated with tiny accessory left renal artery which supplies the inferior pole of the left kidney. Both dominant renal arteries are widely patent without hemodynamically significant narrowing. No vessel irregularity to suggest FMD. IMA: Widely patent without hemodynamically significant narrowing. Inflow: The bilateral common, external and internal iliac arteries are of normal caliber and widely patent without a hemodynamically significant narrowing. Proximal Outflow: The bilateral common and imaged portions of the bilateral deep and superficial femoral arteries are widely patent without hemodynamically significant narrowing. Veins: The IVC and pelvic venous systems appear widely patent. Review of the MIP images confirms the above findings. _________________________________________________________ Evaluation abdominal organs is degraded due to patient body habitus and associated quantum mottle artifact. NON-VASCULAR Lower chest: Limited visualization of the lower thorax demonstrates bibasilar consolidative opacities and associated air bronchograms within the imaged bilateral lower lobes. Cardiomegaly. Coronary artery calcifications. There is diffuse decreased attenuation of the intra cardiac blood pool suggestive of anemia. Trace amount of pericardial fluid, presumably physiologic. Hepatobiliary: Normal hepatic contour. Appropriately positioned cholecystostomy tube with decompression of the gallbladder and resolution of previously noted gallbladder wall thickening or pericholecystic stranding. There is a persistent approximately 1.2 cm radiopaque gallstone within the neck of the gallbladder (image 4, series 4). No intra or extrahepatic biliary ductal dilatation. No ascites. Pancreas: Normal appearance of the pancreas. Spleen: Normal appearance of the spleen. Adrenals/Urinary  Tract:  There is symmetric enhancement of the bilateral kidneys. Nonobstructing renal stones are seen bilaterally with index right-sided renal stone measuring 3 mm (image 132, series 24) and index left renal stone measuring 4 mm (image 151, series 24). There is an approximately 1.6 cm hypoattenuating lesion within the inferior pole of the right kidney (image 135, series 24), which may demonstrate enhancement though is incompletely evaluated on this examination. No discrete left-sided renal lesions. No scratched at there is a minimal amount of likely age and body habitus related perinephric stranding. No urinary obstruction. Normal appearance the bilateral adrenal glands. The urinary bladder is decompressed with a Foley catheter. Stomach/Bowel: Ingested radiopaque material is seen within the gastric fundus and proximal small bowel. Stable positioning of endoscopy clip and adjacent percutaneous coil. No discrete area active extravasation with special attention paid to the area extravasation involving the proximal jejunum demonstrated on CTA performed 09/06/2021. Endoscopy capsule seen within the rectum. Moderate liquid stool burden without evidence of enteric obstruction. Enteric tube terminates within the mid body of the stomach. No pneumoperitoneum, pneumatosis or portal venous gas Lymphatic: No bulky retroperitoneal, mesenteric, pelvic or inguinal lymphadenopathy. Reproductive: Normal appearance of the prostate gland. Other: Diffuse body wall anasarca, most conspicuous about the bilateral flanks. Post ventral hernia abdominal repair without evidence of recurrence. Musculoskeletal: No acute or aggressive osseous abnormalities. Stigmata of dish within the lower thoracic spine. Mild multilevel lumbar spine DDD, worse L1-L2 with disc space height loss, endplate irregularity and sclerosis. Mild degenerative change the bilateral hips with joint space loss, subchondral sclerosis and osteophytosis. IMPRESSION: 1. No  discrete areas intraluminal contrast extravasation to suggest recurrent GI bleeding with special attention paid to the area of contrast extravasation demonstrated on preceding abdominal CTA performed 09/06/2021. 2. Appropriately positioned cholecystostomy tube with decompression of the gallbladder and residual cholelithiasis. 3. Bilateral lower lobe consolidative air space opacities and associated air bronchograms, atelectasis versus infiltrate. 4. Indeterminate approximately 1.6 cm right-sided renal lesion, incompletely evaluated on the present examination though may demonstrate enhancement. Further evaluation with nonemergent abdominal MRI is recommended. Electronically Signed   By: Simonne Come M.D.   On: 09/10/2021 16:33   IR Angiogram Selective Each Additional Vessel  Result Date: 09/09/2021 INDICATION: GI bleed. Briefly, a 61 year old male with hematochezia. Push enteroscopy revealing fresh blood within jejunum. 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 Patient transferred from [facility] for definitive management. EXAM: Procedures: 1. CELIAC and SUPERIOR MESENTERIC ARTERIOGRAPHY 2. COIL EMBOLIZATION of SMA TERRITORY, JEJUNAL ARCADE BRANCH COMPARISON:  CTA AP, earlier same day. MEDICATIONS: 50 mg Benadryl IV.  4 mg Zofran. ANESTHESIA/SEDATION: Moderate (conscious) sedation was employed during this procedure. A total of Versed 1.5 mg and Fentanyl 75 mcg was administered intravenously. Moderate Sedation Time: 80 minutes. The patient's level of consciousness and vital signs were monitored continuously by radiology nursing throughout the procedure under my direct supervision. CONTRAST:  75 mL Isovue-300. FLUOROSCOPY TIME:  Fluoroscopic dose; 2715 mGy COMPLICATIONS: None immediate. PROCEDURE: Informed consent was obtained from the patient and/or patient's representative following explanation of the procedure, risks, benefits and alternatives. All questions were addressed. A time out was  performed prior to the initiation of the procedure. Maximal barrier sterile technique utilized including caps, mask, sterile gowns, sterile gloves, large sterile drape, hand hygiene, and chlorhexidine prep. At the RIGHT groin 1% lidocaine was used for local anesthesia. Limited ultrasound imaging of the groin shows the RIGHT femoral artery to be patent. An ultrasound image was saved and sent to PACS. Arterial access  was obtained with a 21-G, 7-cm needle under direct ultrasound guidance, through which a 0.018-inch guidewire was advanced under fluoroscopy. The 21-G needle was then exchanged for a micropuncture catheter and a 0.035-inch Bentson guidewire. The micropuncture catheter was exchanged for a 5-F sheath. A 5 Fr cobra 2 catheter was used to catheterize the celiac axis then SMA through which a digital subtraction angiogram (DSA) was obtained. No intervention was performed at the celiac axis. The SMA was cannulated with the C2 catheter and glidewire, then using a STC microcatheter and 0.014 inch Fathom microwire access to the jejunal arcades was obtained and subselective DSA was performed. PURPOSE OF THE ARTERIOGRAM: No previous catheter-directed angiogram was available. Therefore a new complete diagnostic angiography was performed. The decision to proceed with an interventional procedure was made based on this new diagnostic angiogram. Super selective SMA territory jejunal arcade branch embolization was then performed using micro coils. A final angiogram was performed. Images were reviewed and the procedure was terminated. All wires, catheters and sheaths were removed from the patient. Hemostasis was achieved at the RIGHT groin access site with Angio-Seal closure. The patient tolerated the procedure well without immediate post procedural complication. FINDINGS: 1. Normal caliber, order and branching of the celiac axis. 2. 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 3. Active extravasation  at SMA territory jejunal arcade branch at the level of the endoscopy clip, (see key image) successfully treated with super selective coil embolization. 4. Angio-Seal closure of the RIGHT groin with distal RIGHT lower extremity pulses of the end of the case. IMPRESSION: Successful super selective coil embolization of active extravasation from SMA territory jejunal arcade branch for upper GI bleed, as above. PLAN: 1. Post sheath removal precautions, including RIGHT lower extremity straight x2 hours. 2. Serial examinations and H/h trend to evaluate potential ongoing GI bleeding. Roanna Banning, MD [facility] Electronically Signed   By: Roanna Banning M.D.   On: 09/09/2021 14:27   ECHOCARDIOGRAM COMPLETE  Result Date: 09/09/2021    ECHOCARDIOGRAM REPORT   Patient Name:   Troy Randall Date of Exam: 09/09/2021 Medical Rec #:  [id number]       Height:       72.0 in Accession #:    [id number]      Weight:       344.1 lb Date of Birth:  [birth year]       BSA:          2.683 m Patient Age:    60 years        BP:           120/78 mmHg Patient Gender: M               HR:           95 bpm. Exam Location:  Inpatient Procedure: 2D Echo, Cardiac Doppler and Color Doppler Indications:    Acute respiratory distress  History:        Patient has prior history of Echocardiogram examinations, most                 recent 04/29/2021. Risk Factors:Hypertension, Diabetes and                 Dyslipidemia.  Sonographer:    Eduard Roux Referring Phys: [phone] MURALI RAMASWAMY  Sonographer Comments: Unable to use Definity due to no IV access. IMPRESSIONS  1. Left ventricular ejection fraction, by estimation, is 65 to 70%. The left ventricle  has hyperdynamic function. The left ventricle has no regional wall motion abnormalities. Left ventricular diastolic parameters are indeterminate.  2. Right ventricular systolic function is normal. The right ventricular size is normal. Tricuspid  regurgitation signal is inadequate for assessing PA pressure.  3. The mitral valve is normal in structure. No evidence of mitral valve regurgitation.  4. The aortic valve is tricuspid. There is mild calcification of the aortic valve. There is mild thickening of the aortic valve. Aortic valve regurgitation is not visualized. Aortic valve sclerosis is present, with no evidence of aortic valve stenosis. Comparison(s): No significant change from prior study. Prior images reviewed side by side. FINDINGS  Left Ventricle: Left ventricular ejection fraction, by estimation, is 65 to 70%. The left ventricle has hyperdynamic function. The left ventricle has no regional wall motion abnormalities. The left ventricular internal cavity size was normal in size. Suboptimal image quality limits for assessment of left ventricular hypertrophy. Left ventricular diastolic parameters are indeterminate. Right Ventricle: The right ventricular size is normal. Right vetricular wall thickness was not well visualized. Right ventricular systolic function is normal. Tricuspid regurgitation signal is inadequate for assessing PA pressure. Left Atrium: Left atrial size was normal in size. Right Atrium: Right atrial size was normal in size. Pericardium: There is no evidence of pericardial effusion. Mitral Valve: The mitral valve is normal in structure. No evidence of mitral valve regurgitation. Tricuspid Valve: The tricuspid valve is normal in structure. Tricuspid valve regurgitation is not demonstrated. Aortic Valve: The aortic valve is tricuspid. There is mild calcification of the aortic valve. There is mild thickening of the aortic valve. Aortic valve regurgitation is not visualized. Aortic valve sclerosis is present, with no evidence of aortic valve stenosis. Aortic valve peak gradient measures 13.0 mmHg. Pulmonic Valve: The pulmonic valve was not well visualized. Aorta: The aortic root was not well visualized. Venous: The inferior vena cava was  not well visualized. IAS/Shunts: The interatrial septum was not well visualized.   Diastology LV e' medial:    4.73 cm/s LV E/e' medial:  12.7 LV e' lateral:   5.40 cm/s LV E/e' lateral: 11.1  LEFT ATRIUM             Index        RIGHT ATRIUM           Index LA Vol (A2C):   32.9 ml 12.26 ml/m  RA Area:     12.70 cm LA Vol (A4C):   35.4 ml 13.19 ml/m  RA Volume:   24.50 ml  9.13 ml/m LA Biplane Vol: 35.5 ml 13.23 ml/m  AORTIC VALVE              PULMONIC VALVE AV Vmax:      180.50 cm/s PV Vmax:       0.84 m/s AV Peak Grad: 13.0 mmHg   PV Peak grad:  2.8 mmHg LVOT Vmax:    142.00 cm/s LVOT Vmean:   95.200 cm/s LVOT VTI:     0.226 m MITRAL VALVE               TRICUSPID VALVE MV Area (PHT): 3.72 cm    TR Peak grad:   8.8 mmHg MV Decel Time: 204 msec    TR Vmax:        148.00 cm/s MV E velocity: 60.00 cm/s MV A velocity: 53.60 cm/s  SHUNTS MV E/A ratio:  1.12        Systemic VTI: 0.23 m Rachelle Hora Croitoru MD Electronically signed by  Mihai Croitoru MD Signature Date/Time: 09/09/2021/11:54:05 AM    Final     Labs:  CBC: Recent Labs    09/10/21 0522 09/10/21 1047 09/10/21 1807 09/10/21 2205 09/11/21 0259 09/12/21 0308  WBC 16.7*  --  17.2*  --  18.5* 13.8*  HGB 6.8*   < > 10.4* 10.1* 10.6* 10.2*  HCT 20.3*   < > 29.5* 28.9* 30.5* 30.8*  PLT 67*  --  47*  --  47* 63*   < > = values in this interval not displayed.     COAGS: Recent Labs    04/28/21 1504 09/05/21 0307 09/06/21 2240 09/07/21 2056  INR 1.0 1.2 1.6* 2.0*  APTT 26  --   --  32     BMP: Recent Labs    09/10/21 1527 09/10/21 1713 09/11/21 0259 09/11/21 1415 09/12/21 0308  NA 140 141 139 141 142  K 4.1 4.2 4.0 3.7 3.6  CL 105 105 107 113* 108  CO2 26  --  24 24 26   GLUCOSE 263* 244* 139* 153* 110*  BUN 34* 32* 30* 27* 21*  CALCIUM 7.1*  --  6.9* 6.5* 7.1*  CREATININE 1.02 0.90 0.85 0.84 0.75  GFRNONAA >60  --  >60 >60 >60     LIVER FUNCTION TESTS: Recent Labs    04/29/21 0502 09/04/21 1238 09/06/21 2240  09/07/21 2054 09/08/21 1808 09/10/21 1527 09/11/21 0259 09/11/21 1415 09/12/21 0308  BILITOT 0.5 0.8 0.4 0.5  --   --   --   --   --   AST 20 15 21  33  --   --   --   --   --   ALT 22 17 16  32  --   --   --   --   --   ALKPHOS 34* 35* 21* 124  --   --   --   --   --   PROT 6.8 6.9 3.7* 3.2*  --   --   --   --   --   ALBUMIN 3.6 3.7 1.8* <1.5*   < > <1.5* <1.5* <1.5* <1.5*   < > = values in this interval not displayed.     Assessment and Plan: GI Bleed s/p coil embolization by Dr. 11/12/21 9/3 No further melena.  No additional extrav seen in CT yesterday.  HgB stable for 2 days at 10.2.   Acute Cholecystitis s/p percutaneous cholecystostomy tube placement 9/3 by Dr. Grace Isaac. WBC improved to 13.8  Drain Location: RUQ Size: Fr size: 10 Fr Date of placement: 09/07/21  Currently to: Drain collection device: gravity 24 hour output:  Output by Drain (mL) 09/10/21 0701 - 09/10/21 1900 09/10/21 1901 - 09/11/21 0700 09/11/21 0701 - 09/11/21 1900 09/11/21 1901 - 09/12/21 0700 09/12/21 0701 - 09/12/21 1603  Closed System Drain 1 Lateral RUQ Other (Comment) 10.2 Fr. 175 165 120 225 110    Interval imaging/drain manipulation:  None  Current examination: Flushes/aspirates easily.  Insertion site unremarkable. Suture and stat lock in place. Dressed appropriately.   Plan: Continue TID flushes with 5 cc NS. Record output Q shift. Dressing changes QD or PRN if soiled.  Call IR APP or on call IR MD if difficulty flushing or sudden change in drain output.   Discharge planning: Please contact IR APP or on call IR MD prior to patient d/c to ensure appropriate follow up plans are in place.   IR will continue to follow - please call with questions or  concerns.   Electronically Signed: Hoyt Koch, PA 09/12/2021, 4:03 PM   I spent a total of 15 Minutes at the the patient's bedside AND on the patient's hospital floor or unit, greater than 50% of which was  counseling/coordinating care for GI bleed, acute cholecystitis.

## 2021-09-12 NOTE — Procedures (Signed)
ETT Exchange Procedure Note  Troy Randall  [id number]  Aug 01, 1960  Date:09/12/21  Time:4:21 PM   Provider Performing: Dr. Thalia Bloodgood, DO Troy Fifer Mechele Collin, MD    Procedure: Intubation (31500)  Indication(s) Cuff leak Acute respiratory failure  Consent Risks of the procedure as well as the alternatives and risks of each were explained to the patient and/or caregiver.  Consent for the procedure was obtained and is signed in the bedside chart   Anesthesia Versed and Fentanyl   Time Out Verified patient identification, verified procedure, site/side was marked, verified correct patient position, special equipment/implants available, medications/allergies/relevant history reviewed, required imaging and test results available.   Sterile Technique Usual hand hygeine, masks, and gloves were used   Procedure Description Patient positioned in bed supine.  Sedation given as noted above.  Using glidescope full glottis grade 1 was visualized with ETT in place. Using bougie endotracheal tube was exchange with passing of new ETT between glottis.  Number of attempts was 1.    Complications/Tolerance None; patient tolerated the procedure well. Chest X-ray is ordered to verify placement.   EBL Minimal   Specimen(s) None

## 2021-09-12 NOTE — Progress Notes (Signed)
Subjective: No acute events.  Objective: Vital signs in last 24 hours: Temp:  [97 F (36.1 C)-98.2 F (36.8 C)] 97.2 F (36.2 C) (09/08 0630) Pulse Rate:  [56-84] 73 (09/08 0630) Resp:  [18] 18 (09/08 0630) BP: (105-171)/(60-93) 118/72 (09/08 0630) SpO2:  [96 %-100 %] 99 % (09/08 0630) Arterial Line BP: (95-185)/(63-91) 135/91 (09/07 1600) FiO2 (%):  [30 %] 30 % (09/08 0600) Weight:  [154.7 kg] 154.7 kg (09/08 0500) Last BM Date : 09/10/21  Intake/Output from previous day: 09/07 0701 - 09/08 0700 In: 760.9 [I.V.:350.6; IV Piggyback:405.3] Out: 3516 [Urine:1050; Drains:345] Intake/Output this shift: No intake/output data recorded.  General appearance: intubated and sedated Resp: clear to auscultation bilaterally Cardio: regular rate and rhythm GI: soft, non-tender; bowel sounds normal; no masses,  no organomegaly and morbidly obese  Lab Results: Recent Labs    09/10/21 1807 09/10/21 2205 09/11/21 0259 09/12/21 0308  WBC 17.2*  --  18.5* 13.8*  HGB 10.4* 10.1* 10.6* 10.2*  HCT 29.5* 28.9* 30.5* 30.8*  PLT 47*  --  47* 63*   BMET Recent Labs    09/11/21 0259 09/11/21 1415 09/12/21 0308  NA 139 141 142  K 4.0 3.7 3.6  CL 107 113* 108  CO2 24 24 26   GLUCOSE 139* 153* 110*  BUN 30* 27* 21*  CREATININE 0.85 0.84 0.75  CALCIUM 6.9* 6.5* 7.1*   LFT Recent Labs    09/12/21 0308  ALBUMIN <1.5*   PT/INR No results for input(s): "LABPROT", "INR" in the last 72 hours. Hepatitis Panel No results for input(s): "HEPBSAG", "HCVAB", "HEPAIGM", "HEPBIGM" in the last 72 hours. C-Diff No results for input(s): "CDIFFTOX" in the last 72 hours. Fecal Lactopherrin No results for input(s): "FECLLACTOFRN" in the last 72 hours.  Studies/Results: DG Abd Portable 1V  Result Date: 09/10/2021 CLINICAL DATA:  Feeding tube placement. EXAM: PORTABLE ABDOMEN - 1 VIEW COMPARISON:  Abdominal CT earlier today. FINDINGS: Enteric tube is looped in the stomach, tip in the region of the  gastric cardia. Right upper quadrant cholecystostomy tube grossly stable in position. IMPRESSION: Enteric tube looped in the stomach, tip in the region of the gastric cardia. Electronically Signed   By: 11/10/2021 M.D.   On: 09/10/2021 19:14   CT Angio Abd/Pel w/ and/or w/o  Result Date: 09/10/2021 CLINICAL DATA:  History proximal small-bowel bleed, post endoscopic clipping and percutaneous coil embolization with persistent drop in hematocrit. Patient also with history of concomitant acute cholecystitis, post cholecystostomy tube. EXAM: CTA ABDOMEN AND PELVIS WITHOUT AND WITH CONTRAST TECHNIQUE: Multidetector CT imaging of the abdomen and pelvis was performed using the standard protocol during bolus administration of intravenous contrast. Multiplanar reconstructed images and MIPs were obtained and reviewed to evaluate the vascular anatomy. RADIATION DOSE REDUCTION: This exam was performed according to the departmental dose-optimization program which includes automated exposure control, adjustment of the mA and/or kV according to patient size and/or use of iterative reconstruction technique. CONTRAST:  11/10/2021 OMNIPAQUE IOHEXOL 350 MG/ML SOLN COMPARISON:  CTA abdomen and pelvis-09/07/2021; 09/06/2021; 09/05/2021 Mesenteric arteriogram and percutaneous coil embolization-09/06/2021 Mesenteric arteriogram-09/07/2021 Image guided cholecystostomy tube placement-09/07/2021 FINDINGS: VASCULAR Aorta: Normal caliber of the abdominal aorta. No evidence of abdominal aortic dissection or perivascular stranding. Celiac: Tortuosity involving the origin of the celiac artery, not definitely resulting in hemodynamically significant stenosis. SMA: Widely patent without hemodynamically significant narrowing. A replaced right hepatic artery is noted to arise from the proximal SMA. The distal tributaries of the SMA appear widely patent without discrete lumen  filling defect to suggest distal embolism. No discrete areas of  intraluminal contrast extravasation are identified with special attention paid to the area of previously noted GI bleeding demonstrated on CTA performed 09/06/2021. Renals: The left renal artery is duplicated with tiny accessory left renal artery which supplies the inferior pole of the left kidney. Both dominant renal arteries are widely patent without hemodynamically significant narrowing. No vessel irregularity to suggest FMD. IMA: Widely patent without hemodynamically significant narrowing. Inflow: The bilateral common, external and internal iliac arteries are of normal caliber and widely patent without a hemodynamically significant narrowing. Proximal Outflow: The bilateral common and imaged portions of the bilateral deep and superficial femoral arteries are widely patent without hemodynamically significant narrowing. Veins: The IVC and pelvic venous systems appear widely patent. Review of the MIP images confirms the above findings. _________________________________________________________ Evaluation abdominal organs is degraded due to patient body habitus and associated quantum mottle artifact. NON-VASCULAR Lower chest: Limited visualization of the lower thorax demonstrates bibasilar consolidative opacities and associated air bronchograms within the imaged bilateral lower lobes. Cardiomegaly. Coronary artery calcifications. There is diffuse decreased attenuation of the intra cardiac blood pool suggestive of anemia. Trace amount of pericardial fluid, presumably physiologic. Hepatobiliary: Normal hepatic contour. Appropriately positioned cholecystostomy tube with decompression of the gallbladder and resolution of previously noted gallbladder wall thickening or pericholecystic stranding. There is a persistent approximately 1.2 cm radiopaque gallstone within the neck of the gallbladder (image 4, series 4). No intra or extrahepatic biliary ductal dilatation. No ascites. Pancreas: Normal appearance of the pancreas.  Spleen: Normal appearance of the spleen. Adrenals/Urinary Tract: There is symmetric enhancement of the bilateral kidneys. Nonobstructing renal stones are seen bilaterally with index right-sided renal stone measuring 3 mm (image 132, series 24) and index left renal stone measuring 4 mm (image 151, series 24). There is an approximately 1.6 cm hypoattenuating lesion within the inferior pole of the right kidney (image 135, series 24), which may demonstrate enhancement though is incompletely evaluated on this examination. No discrete left-sided renal lesions. No scratched at there is a minimal amount of likely age and body habitus related perinephric stranding. No urinary obstruction. Normal appearance the bilateral adrenal glands. The urinary bladder is decompressed with a Foley catheter. Stomach/Bowel: Ingested radiopaque material is seen within the gastric fundus and proximal small bowel. Stable positioning of endoscopy clip and adjacent percutaneous coil. No discrete area active extravasation with special attention paid to the area extravasation involving the proximal jejunum demonstrated on CTA performed 09/06/2021. Endoscopy capsule seen within the rectum. Moderate liquid stool burden without evidence of enteric obstruction. Enteric tube terminates within the mid body of the stomach. No pneumoperitoneum, pneumatosis or portal venous gas Lymphatic: No bulky retroperitoneal, mesenteric, pelvic or inguinal lymphadenopathy. Reproductive: Normal appearance of the prostate gland. Other: Diffuse body wall anasarca, most conspicuous about the bilateral flanks. Post ventral hernia abdominal repair without evidence of recurrence. Musculoskeletal: No acute or aggressive osseous abnormalities. Stigmata of dish within the lower thoracic spine. Mild multilevel lumbar spine DDD, worse L1-L2 with disc space height loss, endplate irregularity and sclerosis. Mild degenerative change the bilateral hips with joint space loss,  subchondral sclerosis and osteophytosis. IMPRESSION: 1. No discrete areas intraluminal contrast extravasation to suggest recurrent GI bleeding with special attention paid to the area of contrast extravasation demonstrated on preceding abdominal CTA performed 09/06/2021. 2. Appropriately positioned cholecystostomy tube with decompression of the gallbladder and residual cholelithiasis. 3. Bilateral lower lobe consolidative air space opacities and associated air bronchograms, atelectasis versus infiltrate. 4.  Indeterminate approximately 1.6 cm right-sided renal lesion, incompletely evaluated on the present examination though may demonstrate enhancement. Further evaluation with nonemergent abdominal MRI is recommended. Electronically Signed   By: Simonne Come M.D.   On: 09/10/2021 16:33    Medications: Scheduled:  sodium chloride   Intravenous Once   Chlorhexidine Gluconate Cloth  6 each Topical Daily   feeding supplement (PROSource TF20)  60 mL Per Tube 5 X Daily   fentaNYL (SUBLIMAZE) injection  50 mcg Intravenous Once   insulin aspart  0-20 Units Subcutaneous Q4H   insulin glargine-yfgn  15 Units Subcutaneous Daily   mouth rinse  15 mL Mouth Rinse Q2H   pantoprazole (PROTONIX) IV  40 mg Intravenous Q24H   sodium chloride flush  10-40 mL Intracatheter Q12H   sodium chloride flush  5 mL Intracatheter Q8H   Continuous:   prismasol BGK 4/2.5 400 mL/hr at 09/12/21 0105    prismasol BGK 4/2.5 400 mL/hr at 09/12/21 0105   sodium chloride 10 mL/hr at 09/12/21 0700   cefTRIAXone (ROCEPHIN)  IV Stopped (09/11/21 1134)   feeding supplement (VITAL 1.5 CAL) Stopped (09/10/21 0935)   fentaNYL infusion INTRAVENOUS Stopped (09/12/21 0603)   norepinephrine (LEVOPHED) Adult infusion Stopped (09/10/21 1520)   phenylephrine (NEO-SYNEPHRINE) Adult infusion Stopped (09/09/21 1258)   prismasol BGK 4/2.5 1,500 mL/hr at 09/12/21 0445   vasopressin Stopped (09/10/21 1211)    Assessment/Plan: 1) S/p jejunal  bleed. 2) Respiratory failure.   From the GI standpoint he appears to be stable.  His HGB remains in the 10 g/dL range.  No reports of any hematochezia, hematemesis, or melena.  Plan: 1) Continue with supportive care. 2) Signing off.  Please call with any questions.  LOS: 8 days   Lavilla Delamora D 09/12/2021, 7:11 AM

## 2021-09-12 NOTE — Progress Notes (Signed)
Patient transported to CT & back to room 3M07 on the ventilator with no problems.

## 2021-09-12 NOTE — Progress Notes (Signed)
[facility] Progress Note  Name: Troy Randall MRN: [id number] DOB: September 14, 1960  Chief Complaint:  Dark stools on presentation  Subjective:  Seen and examined on CRRT; procedure supervised.  He had 2.2 kg UF over 9/7 with CRRT.  He had 1.1 L UOP over 9/7.  No additional PRBC's yesterday.  They have just turned off his fentanyl.  Updated his wife at bedside.    Review of systems:  Unable to obtain given intubated and sedated ------------- Background on referral:  Troy Randall is a 61 y.o. male with a history of hypertension, diabetes, nephrolithiasis, arthritis, and prior CVA who presented to the hospital with dark stools.  He had also complained of dizziness.  He had CTA which was without active bleed and had a colonoscopy without active bleed but clots noted throughout the entire colon per charting. Later underwent EGD with erosive gastropathy but no stigmata of bleeding.  Then had small bowel enteroscopy and later repeat CTA which was concerning for a bleed.  He was transferred from [facility] to [facility] and IR was consulted.  He underwent coil embolization. Per pulm he had hemorrhagic shock then developed septic shock.  Note he was also found to have cholecystitis and is s/p placement of a cholecystostomy tube.  He is on three pressors - levo, phenylephrine and vasopressin.  He has had respiratory failure and remains intubated. He has had worsening AKI and nephrology is consulted for assistance with management.  He had 400 mL uop over 9/3.    I spoke with his wife about the risks/benefits/indications for renal replacement therapy.  Primary team is at bedside placing a dialysis catheter.  The patient is on max levo at 40 mcg/min and phenylephrine at 400 mcg/min as well as bicarb gtt.  Team is hopeful that improving his acidosis may reduce his pressor requirement.     Intake/Output Summary (Last 24 hours) at 09/12/2021 0736 Last data filed at 09/12/2021 0700 Gross per 24 hour   Intake 760.94 ml  Output 3721 ml  Net -2960.06 ml    Vitals:  Vitals:   09/12/21 0600 09/12/21 0630 09/12/21 0700 09/12/21 0715  BP: 105/64 118/72 123/66   Pulse: 73 73 75 73  Resp: 18 18 18 18   Temp: (!) 97 F (36.1 C) (!) 97.2 F (36.2 C) 97.7 F (36.5 C) 97.7 F (36.5 C)  TempSrc:    Esophageal  SpO2: 100% 99% 98%   Weight:      Height:         Physical Exam:    General:  adult male critically ill   HEENT: NCAT Eyes: closed; does not track Neck: increased neck circumference; trachea midline Heart: s1S2 no rub Lungs: coarse mechanical breath sounds; on FIO2 30 and PEEP 5 Abdomen: soft/obese  Extremities: 1-2+ edema lower extremities  Neuro: sedation currently running Access - RIJ nontunneled catheter in place  Medications reviewed   Labs:     Latest Ref Rng & Units 09/12/2021    3:08 AM 09/11/2021    2:15 PM 09/11/2021    2:59 AM  BMP  Glucose 70 - 99 mg/dL 11/11/2021  102  725   BUN 6 - 20 mg/dL 21  27  30    Creatinine 0.61 - 1.24 mg/dL 366   4.40   Sodium 135 - 145 mmol/L 142  141  139   Potassium 3.5 - 5.1 mmol/L 3.6  3.7  4.0   Chloride 98 - 111 mmol/L 108  113  107   CO2 22 - 32 mmol/L 26  24  24    Calcium 8.9 - 10.3 mg/dL 7.1  6.5  6.9      Assessment/Plan:   # AKI  - secondary to ischemic ATN in the setting of multifactorial shock.  He has received clinically indicated contrast.  He was started on CRRT on 9/4 PM after nontunneled catheter placement with critical care to improve acidosis - Will stop CRRT.  Hopeful for renal recovery. - lasix 60 mg IV once     # Shock - mixed Septic and hemorrhagic shock  - Pressors per primary team  - source control of GI bleed and infection as below - abx per primary team    # Acute hypoxic respiratory failure  - on mechanical ventilation per primary team  - optimizing volume status    # Acute GI bleed - s/p coil embolization  - s/p multiple studies and interventions as above - on PPI - transfusions per  primary team - work-up per primary team      # Acute blood loss anemia  - transfusions per primary team    # Metabolic acidosis - secondary to AKI; as above - on CRRT    # Cholecystitis  - s/p cholecystomy drain placement  - abx per primary team   # Thrombocytopenia - no heparin with CRRT - stable and slightly improved thankfully  - work-up per primary team   # Hypophosphatemia - replete today   Disposition - continue ICU monitoring  11/4, MD 09/12/2021 7:48 AM

## 2021-09-12 NOTE — Progress Notes (Signed)
CT head without contrast with final read of multiple interval white matter insults which could be vessel infarcts or inflammatory process and recommendation of MRI with contrast.  Patient recently had hemostatic clip placed by GI.  Spoke with interventional radiology as well as GI Dr. Elnoria Howard who note that [facility] [employer] sign clips are MRI safe.  Also spoke with [facility] where clip was placed, they also note that the hemostatic clips are MRI compatible and also gave number to call of [facility] [employer] for MRI settings.   Of note clip placed was a [employer] ultra 360 hemostatic clip

## 2021-09-12 NOTE — Progress Notes (Signed)
Initial Nutrition Assessment  DOCUMENTATION CODES:   Obesity unspecified  INTERVENTION:   Recommend initiate tube feeding via OG tube: Vital 1.5 at 20 ml/hr and increase by 10 ml every 8 hours to goal rate of 60 ml/h (1440 ml per day) Prosource TF20 60 ml BID  Provides 2380 kcal, 189 gm protein, 1003 ml free water daily   NUTRITION DIAGNOSIS:   Increased nutrient needs related to acute illness as evidenced by estimated needs. Ongoing  GOAL:   Patient will meet greater than or equal to 90% of their needs Progressing.   MONITOR:   TF tolerance  REASON FOR ASSESSMENT:   Consult Enteral/tube feeding initiation and management  ASSESSMENT:   Pt with PMH of HTN, HLD, DM, CVA on ASA/plavix admitted 8/31 from [facility] due to gi bleed.   Pt on vent support. CRRT stopped today  Restarting TF today per RN   8/31 admit 9/2 s/p colonoscopy: no active bleed but large clots throughout entire colon; s/p EGD: small hital hernia, erosive gastropathy, non-bleeding duodenal diverticulum; s/p small bowel enteroscopy: jejunal diverticular bleeding s/p coil embolization  9/3 intubated; new cholecystitis s/p IR for cholecystectomy tube  9/4 CRRT started  9/8 CRRT stopped   Medications reviewed and include: SSI, 15 units semglee daily, protoinx  Fentanyl  Naphos x 1    Labs reviewed: PO4: 2.1 CBG's: 105-162 Last A1C: 6.2 (04/06/2021)   RUQ drain: 345 ml  UF: 2236 ml   OG tube: tip within the stomach per xray     Diet Order:   Diet Order             Diet NPO time specified  Diet effective now                   EDUCATION NEEDS:   No education needs have been identified at this time  Skin:  Skin Assessment: Reviewed RN Assessment  Last BM:  9/4 x 2 maroon  Height:   Ht Readings from Last 1 Encounters:  09/11/21 6' (1.829 m)    Weight:   Wt Readings from Last 1 Encounters:  09/12/21 (!) 154.7 kg    Ideal Body Weight:    80.9 kg   BMI:  Body mass index is  46.25 kg/m.  Estimated Nutritional Needs:   Kcal:  2200-2500  Protein:  130-150 grams  Fluid:  >2 L/day  Cammy Copa., RD, LDN, CNSC See AMiON for contact information

## 2021-09-13 ENCOUNTER — Inpatient Hospital Stay (HOSPITAL_COMMUNITY): Payer: Medicare HMO

## 2021-09-13 DIAGNOSIS — J9601 Acute respiratory failure with hypoxia: Secondary | ICD-10-CM | POA: Diagnosis not present

## 2021-09-13 DIAGNOSIS — E1110 Type 2 diabetes mellitus with ketoacidosis without coma: Secondary | ICD-10-CM | POA: Diagnosis not present

## 2021-09-13 DIAGNOSIS — N170 Acute kidney failure with tubular necrosis: Secondary | ICD-10-CM | POA: Diagnosis not present

## 2021-09-13 DIAGNOSIS — K922 Gastrointestinal hemorrhage, unspecified: Secondary | ICD-10-CM | POA: Diagnosis not present

## 2021-09-13 DIAGNOSIS — I10 Essential (primary) hypertension: Secondary | ICD-10-CM | POA: Diagnosis not present

## 2021-09-13 DIAGNOSIS — R579 Shock, unspecified: Secondary | ICD-10-CM | POA: Diagnosis not present

## 2021-09-13 DIAGNOSIS — D62 Acute posthemorrhagic anemia: Secondary | ICD-10-CM | POA: Diagnosis not present

## 2021-09-13 LAB — RENAL FUNCTION PANEL
Albumin: <1.5 g/dL — ABNORMAL LOW (ref 3.5–5.0)
Anion gap: 8 (ref 5–15)
BUN: 26 mg/dL — ABNORMAL HIGH (ref 6–20)
CO2: 26 mmol/L (ref 22–32)
Calcium: 7.2 mg/dL — ABNORMAL LOW (ref 8.9–10.3)
Chloride: 106 mmol/L (ref 98–111)
Creatinine, Ser: 0.79 mg/dL (ref 0.61–1.24)
GFR, Estimated: >60 mL/min (ref >60)
Glucose, Bld: 159 mg/dL — ABNORMAL HIGH (ref 70–99)
Phosphorus: 3.8 mg/dL (ref 2.5–4.6)
Potassium: 3.4 mmol/L — ABNORMAL LOW (ref 3.5–5.1)
Sodium: 140 mmol/L (ref 135–145)

## 2021-09-13 LAB — BPAM RBC
Blood Product Expiration Date: 202309262359
Blood Product Expiration Date: 202309282359
Blood Product Expiration Date: 202309292359
Blood Product Expiration Date: 202309292359
Blood Product Expiration Date: 202310042359
Blood Product Expiration Date: 202310042359
Blood Product Expiration Date: 202310042359
ISSUE DATE / TIME: 202309060632
ISSUE DATE / TIME: 202309061206
ISSUE DATE / TIME: 202309061419
ISSUE DATE / TIME: 202309061633
Unit Type and Rh: 600
Unit Type and Rh: 600
Unit Type and Rh: 600
Unit Type and Rh: 600
Unit Type and Rh: 600
Unit Type and Rh: 600
Unit Type and Rh: 600

## 2021-09-13 LAB — GLUCOSE, CAPILLARY
Glucose-Capillary: 149 mg/dL — ABNORMAL HIGH (ref 70–99)
Glucose-Capillary: 156 mg/dL — ABNORMAL HIGH (ref 70–99)
Glucose-Capillary: 183 mg/dL — ABNORMAL HIGH (ref 70–99)
Glucose-Capillary: 190 mg/dL — ABNORMAL HIGH (ref 70–99)
Glucose-Capillary: 196 mg/dL — ABNORMAL HIGH (ref 70–99)
Glucose-Capillary: 204 mg/dL — ABNORMAL HIGH (ref 70–99)

## 2021-09-13 LAB — TYPE AND SCREEN
ABO/RH(D): A NEG
ABO/RH(D): A NEG
Antibody Screen: NEGATIVE
Antibody Screen: NEGATIVE
Unit division: 0
Unit division: 0
Unit division: 0
Unit division: 0
Unit division: 0
Unit division: 0
Unit division: 0

## 2021-09-13 LAB — CBC WITH DIFFERENTIAL/PLATELET
Abs Immature Granulocytes: 0 10*3/uL (ref 0.00–0.07)
Basophils Absolute: 0.1 10*3/uL (ref 0.0–0.1)
Basophils Relative: 1 %
Eosinophils Absolute: 0.3 10*3/uL (ref 0.0–0.5)
Eosinophils Relative: 3 %
HCT: 30 % — ABNORMAL LOW (ref 39.0–52.0)
Hemoglobin: 9.6 g/dL — ABNORMAL LOW (ref 13.0–17.0)
Lymphocytes Relative: 4 %
Lymphs Abs: 0.4 10*3/uL — ABNORMAL LOW (ref 0.7–4.0)
MCH: 28.3 pg (ref 26.0–34.0)
MCHC: 32 g/dL (ref 30.0–36.0)
MCV: 88.5 fL (ref 80.0–100.0)
Monocytes Absolute: 0.3 10*3/uL (ref 0.1–1.0)
Monocytes Relative: 3 %
Neutro Abs: 9.3 10*3/uL — ABNORMAL HIGH (ref 1.7–7.7)
Neutrophils Relative %: 89 %
Platelets: 108 10*3/uL — ABNORMAL LOW (ref 150–400)
RBC: 3.39 MIL/uL — ABNORMAL LOW (ref 4.22–5.81)
RDW: 17.6 % — ABNORMAL HIGH (ref 11.5–15.5)
WBC: 10.4 10*3/uL (ref 4.0–10.5)
nRBC: 0 /100 WBC
nRBC: 0.3 % — ABNORMAL HIGH (ref 0.0–0.2)

## 2021-09-13 LAB — MAGNESIUM: Magnesium: 2.5 mg/dL — ABNORMAL HIGH (ref 1.7–2.4)

## 2021-09-13 MED ORDER — POLYETHYLENE GLYCOL 3350 17 G PO PACK
17.0000 g | PACK | Freq: Every day | ORAL | Status: DC
  Administered 2021-09-13 – 2021-10-03 (×15): 17 g
  Filled 2021-09-13 (×20): qty 1

## 2021-09-13 MED ORDER — FENTANYL CITRATE PF 50 MCG/ML IJ SOSY: 50.0000 ug | PREFILLED_SYRINGE | INTRAMUSCULAR | Status: DC | PRN

## 2021-09-13 MED ORDER — DOCUSATE SODIUM 50 MG/5ML PO LIQD
100.0000 mg | Freq: Every day | ORAL | Status: DC
  Administered 2021-09-13 – 2021-10-03 (×17): 100 mg
  Filled 2021-09-13 (×20): qty 10

## 2021-09-13 MED ORDER — MIDAZOLAM HCL 2 MG/2ML IJ SOLN
2.0000 mg | INTRAMUSCULAR | Status: DC | PRN
  Administered 2021-09-14: 2 mg via INTRAVENOUS
  Filled 2021-09-13: qty 2

## 2021-09-13 MED ORDER — POTASSIUM CHLORIDE 20 MEQ PO PACK
40.0000 meq | PACK | Freq: Once | ORAL | Status: AC
  Administered 2021-09-13: 40 meq
  Filled 2021-09-13: qty 2

## 2021-09-13 MED ORDER — FUROSEMIDE 10 MG/ML IJ SOLN
60.0000 mg | Freq: Once | INTRAMUSCULAR | Status: AC
Start: 2021-09-13 — End: 2021-09-13
  Administered 2021-09-13: 60 mg via INTRAVENOUS
  Filled 2021-09-13: qty 6

## 2021-09-13 NOTE — Progress Notes (Signed)
[facility] Progress Note  Name: Troy Randall MRN: [id number] DOB: [birth year]  Chief Complaint:  Dark stools on presentation  Subjective:  He had 3.5 liters UOP over 9/8.  Spoke with his wife and his brother at bedside.  His team is planning for additional imaging.  He has been a little more interactive.   Review of systems:   Unable to obtain given intubated  ------------- Background on referral:  Troy Randall is a 61 y.o. male with a history of hypertension, diabetes, nephrolithiasis, arthritis, and prior CVA who presented to the hospital with dark stools.  He had also complained of dizziness.  He had CTA which was without active bleed and had a colonoscopy without active bleed but clots noted throughout the entire colon per charting. Later underwent EGD with erosive gastropathy but no stigmata of bleeding.  Then had small bowel enteroscopy and later repeat CTA which was concerning for a bleed.  He was transferred from [facility] to [facility] and IR was consulted.  He underwent coil embolization. Per pulm he had hemorrhagic shock then developed septic shock.  Note he was also found to have cholecystitis and is s/p placement of a cholecystostomy tube.  He is on three pressors - levo, phenylephrine and vasopressin.  He has had respiratory failure and remains intubated. He has had worsening AKI and nephrology is consulted for assistance with management.  He had 400 mL uop over 9/3.    I spoke with his wife about the risks/benefits/indications for renal replacement therapy.  Primary team is at bedside placing a dialysis catheter.  The patient is on max levo at 40 mcg/min and phenylephrine at 400 mcg/min as well as bicarb gtt.  Team is hopeful that improving his acidosis may reduce his pressor requirement.     Intake/Output Summary (Last 24 hours) at 09/13/2021 0810 Last data filed at 09/13/2021 0700 Gross per 24 hour  Intake 1066.87 ml  Output 3815 ml  Net -2748.13 ml    Vitals:   Vitals:   09/13/21 0530 09/13/21 0600 09/13/21 0630 09/13/21 0700  BP: 116/63 122/66 112/63 139/63  Pulse: 74 69 73 75  Resp: 18 16 18 18   Temp: 98.6 F (37 C) 98.6 F (37 C) 98.6 F (37 C) 98.6 F (37 C)  TempSrc:  Esophageal    SpO2: 92% 94% 91% 94%  Weight:      Height:         Physical Exam:     General:  adult male critically ill   HEENT: NCAT Eyes: closed; does not track Neck: increased neck circumference; trachea midline Heart: s1S2 no rub Lungs: coarse mechanical breath sounds; on FIO2 30 and PEEP 5 Abdomen: soft/obese  Extremities: 1-2+ edema lower extremities  Neuro: no sedation running.  He moved his right arm slightly to command Access - RIJ nontunneled catheter in place  Medications reviewed   Labs:     Latest Ref Rng & Units 09/13/2021    3:27 AM 09/12/2021    3:08 AM 09/11/2021    2:15 PM  BMP  Glucose 70 - 99 mg/dL 11/11/2021  563  893   BUN 6 - 20 mg/dL 26  21  27    Creatinine 0.61 - 1.24 mg/dL 734   2.87   Sodium 135 - 145 mmol/L 140  142  141   Potassium 3.5 - 5.1 mmol/L 3.4  3.6  3.7   Chloride 98 - 111 mmol/L 106  108  113  CO2 22 - 32 mmol/L 26  26  24    Calcium 8.9 - 10.3 mg/dL 7.2  7.1  6.5      Assessment/Plan:   # AKI  - secondary to ischemic ATN in the setting of multifactorial shock.  He has received clinically indicated contrast.  He was started on CRRT on 9/4 PM after nontunneled catheter placement with critical care to improve acidosis - lasix 60 mg IV once today. Replete K.  - renal failure resolved and off of CRRT   # Shock - mixed Septic and hemorrhagic shock  - Off Pressors   - source control of GI bleed and infection as below - abx per primary team    # Acute hypoxic respiratory failure  - on mechanical ventilation per primary team  - optimizing volume status    # Acute GI bleed - s/p coil embolization  - s/p multiple studies and interventions as above - on PPI - transfusions per primary team - work-up per primary  team      # Acute blood loss anemia  - transfusions per primary team    # Metabolic acidosis - secondary to AKI; as above - s/p CRRT; resolved    # Cholecystitis  - s/p cholecystomy drain placement  - abx per primary team   # Thrombocytopenia - improving  # Hypophosphatemia - resolved  Disposition - will sign off.  AKI has thankfully resolved.  Thank you for the consult.  Please do not hesitate to contact me if I can be of assistance   11/4, MD 09/13/2021 8:28 AM

## 2021-09-13 NOTE — Progress Notes (Deleted)
Called to patient's room by PT. Patient noted to have saturated through large honeycomb dressing as well as bed pad and bed sheet. While working with PT patient stated he was feeling lightheaded and had increased numbness in both lower extremities while sitting on the side of the bed. Per the patient he had markedly increased numbness in his RLE. Patient helped back into bed and new dressing applied.   Neurosurgery, Dr. Nundkumar aware. No new orders at this time.        

## 2021-09-13 NOTE — Progress Notes (Signed)
NAME:  Troy Randall, MRN:  [id number], DOB:  08/01/1960, LOS: 9 ADMISSION DATE:  09/04/2021, CONSULTATION DATE:  09/06/21 REFERRING MD:  [name], CHIEF COMPLAINT:  melena   History of Present Illness:   Troy Randall is an 61 y.o. male with prior history of HTN, HLD, DM, and CVA on ASA/ plavix who presented to [facility] on 8/31 after developing frequent 6-7 dark stools and feeling dizzy and lightheaded on standing.  No hx of NSAIDs.  FOBT positive and initial Hgb 12.2.    Patient admitted to [facility] with GI consulting.  Remained orthostatic with progressive decline in Hgb.  CTA abdomen did not show any active bleed, therefore underwent colonoscopy > no active bleed found but large clots throughout entire colon.  Then underwent EGD which showed a small hiatal hernia, erosive gastropathy with no stigmata of recent bleeding, and non-bleeding duodenal diverticulum.   Underwent then small bowel enteroscopy which noted intermittent blood pumping in the proximal jejunum but was unable to be reached despite multiple attempts, was injected and clip placed.  Repeat CTA repeated, which showed positive jejunal diverticular bleeding. Patient was transferred to [facility] for IR involvement. In IR active extravasation  at [facility] territory, jejunal arcade branch, successful coil embolization.  PCCM was consulted for transfer and admission.  Pertinent  Medical History  HTN, HLD, DM, CVA on ASA/ plavix  Significant Hospital Events: Including procedures, antibiotic start and stop dates in addition to other pertinent events   8/31 presented to [facility], [name] Admit, Gi Consult 9/1 CTA> no evidence of active GI bleed, 2 U PRBC 9/2 2 U PRBC, colonoscopy > no active bleed found but large clots throughout entire colon. EGD> small hiatal hernia, erosive gastropathy with no stigmata of recent bleeding, and non-bleeding duodenal diverticulum.   Underwent then small bowel enteroscopy which noted intermittent blood pumping in the proximal jejunum  but was unable to be reached despite multiple attempts, was injected and clip placed. Repeat CTA repeated, which showed positive jejunal diverticular bleeding. IR> active extrav at [facility] territory, jejunal arcade branch, successful coil embo 9/3 Intubated. CTA> no evidence of active GI bleeding.  New finding of abnormal gallbladder with nondependent air, gallbladder wall air, pericholecystic and right upper quadrant information.  IR placed percutaneous cholecystotomy tube.  9/4 R. IJ central line placed with dialysis triple lumen catheter 9/5 CRRT initiated 9/6 Bedside EGD without any evidence of bleeding. CTA a/p without etiology for bleed 9/8 CRRT discontinued. Required ETT exchange yesterday due to blown cuff. Interim History / Subjective:  CRRT discontinued. Required ETT exchange yesterday due to blown cuff. Off sedation  Objective   Blood pressure 139/63, pulse 75, temperature 98.8 F (37.1 C), temperature source Axillary, resp. rate 18, height 6' (1.829 m), weight (!) 146.1 kg, SpO2 94 %. Vent Mode: PRVC FiO2 (%):  [30 %] 30 % Set Rate:  [18 bmp] 18 bmp Vt Set:  [620 mL] 620 mL PEEP:  [5 cmH20] 5 cmH20 Plateau Pressure:  [17 cmH20-19 cmH20] 19 cmH20   Intake/Output Summary (Last 24 hours) at 09/13/2021 0854 Last data filed at 09/13/2021 0700 Gross per 24 hour  Intake 1066.87 ml  Output 3815 ml  Net -2748.13 ml   Filed Weights   09/11/21 0400 09/12/21 0500 09/13/21 0500  Weight: (!) 155.6 kg (!) 154.7 kg (!) 146.1 kg   Physical Exam: General: Chronically ill-appearing, no acute distress HENT: [place], AT, ETT in place Eyes: EOMI, no scleral icterus Respiratory: Clear to auscultation bilaterally.  No  crackles, wheezing or rales Cardiovascular: RRR, -M/R/G, no JVD GI: BS+, soft, nontende, perc drain in place Extremities: 1+ pitting edema,-tenderness Neuro: Opens eyes, no tracking, does not follow commands or withdraw, moves extremities x 4 spontaenously GU: Foley in  place   Resolved Hospital Problem list   AKI - off CRRT  Assessment & Plan:   Acute encephalopathy secondary to sepsis History of CVA without residual deficits Off sedation with minimal interaction CT head 9/8 with small vessel infarcts vs inflammatory process -hold asa plavix in setting of acute GI bleed -MRI ordered. Recent hemostatic clip confirmed to be MRI compatible. OK to proceed once XR abdomen confirms endoscopy capsule has been passed  Acute hypoxemic respiratory failure Likely in setting of acute illness, shock, and hypervolemia as per above. -Full vent support -SBT/WUA daily. However mental status precludes extubation -VAP protocol -daily SBT/SAT -Minimize sedation  Hemorrhagic shock ABLA secondary to lower GI bleed, jejunal diverticular and ulcers secondary to ischemia Acute blood loss anemia, secondary to above - 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 and bedside EGD 9/6 No further bleed -Daily CBC -Transfuse PRBC if HBG less than 7  Septic shock - resolved Ecoli Cholecystitis s/p percutaneous cholecystostomy drain placement 9/3 percutaneous cholecystostomy drain placed.  Resolved leukocytosis -Complete total 7d of antibiotics: Ceftriaxone  -Continue to monitor drain -Trend fever/WBC -Will need outpatient surgery follow up  AKI  - off CRRT 9/8 Bilateral nonobstructing nephrolithiasis - suspect chronic BPH -Nephrology signed off -Diurese -Trend electrolytes, replete as needed -Hold flomax -Foley catheter in place, strict i/o  Thrombocytopenia - improving Low suspicion for HIT.  -Trend with daily CBC  DM2 SSI and Semglee of 15 units.  Will adjust as needed with restarting tube feeds today. -Blood Glucose goal 140-180. -SSI, semglee   HTN HLD -Hold home antihypertensives and statin  Pulmonary nodules in bilateral bases, L 24mm Family reports patient is a never smoker. Family does endorse dipping. -no further follow up needed  for a low risk patient based on fleischner criteria  Best Practice (right click and "Reselect all SmartList Selections" daily)   Diet/type: TF DVT prophylaxis: SCD GI prophylaxis: PPI Lines: a-line, foley, R. IJ central line Foley:  yes Code Status:  full code Last date of multidisciplinary goals of care discussion [Full scope- Discussed with wife [name] 9/2 . Family updated at bedside  The patient is critically ill with multiple organ systems failure and requires high complexity decision making for assessment and support, frequent evaluation and titration of therapies, application of advanced monitoring technologies and extensive interpretation of multiple databases.  Independent Critical Care Time: 40 Minutes.   Mechele Collin, M.D. [facility] Pulmonary/Critical Care Medicine 09/13/2021 8:55 AM   Please see Amion for pager number to reach on-call Pulmonary and Critical Care Team.

## 2021-09-14 DIAGNOSIS — K922 Gastrointestinal hemorrhage, unspecified: Secondary | ICD-10-CM | POA: Diagnosis not present

## 2021-09-14 LAB — CBC WITH DIFFERENTIAL/PLATELET
Abs Immature Granulocytes: 0.32 10*3/uL — ABNORMAL HIGH (ref 0.00–0.07)
Basophils Absolute: 0 10*3/uL (ref 0.0–0.1)
Basophils Relative: 0 %
Eosinophils Absolute: 0.3 10*3/uL (ref 0.0–0.5)
Eosinophils Relative: 2 %
HCT: 30.7 % — ABNORMAL LOW (ref 39.0–52.0)
Hemoglobin: 9.9 g/dL — ABNORMAL LOW (ref 13.0–17.0)
Immature Granulocytes: 3 %
Lymphocytes Relative: 7 %
Lymphs Abs: 0.8 10*3/uL (ref 0.7–4.0)
MCH: 28.8 pg (ref 26.0–34.0)
MCHC: 32.2 g/dL (ref 30.0–36.0)
MCV: 89.2 fL (ref 80.0–100.0)
Monocytes Absolute: 0.5 10*3/uL (ref 0.1–1.0)
Monocytes Relative: 5 %
Neutro Abs: 8.6 10*3/uL — ABNORMAL HIGH (ref 1.7–7.7)
Neutrophils Relative %: 83 %
Platelets: 194 10*3/uL (ref 150–400)
RBC: 3.44 MIL/uL — ABNORMAL LOW (ref 4.22–5.81)
RDW: 17.4 % — ABNORMAL HIGH (ref 11.5–15.5)
WBC: 10.5 10*3/uL (ref 4.0–10.5)
nRBC: 0 % (ref 0.0–0.2)

## 2021-09-14 LAB — GLUCOSE, CAPILLARY
Glucose-Capillary: 184 mg/dL — ABNORMAL HIGH (ref 70–99)
Glucose-Capillary: 215 mg/dL — ABNORMAL HIGH (ref 70–99)
Glucose-Capillary: 249 mg/dL — ABNORMAL HIGH (ref 70–99)
Glucose-Capillary: 240 mg/dL — ABNORMAL HIGH (ref 70–99)
Glucose-Capillary: 230 mg/dL — ABNORMAL HIGH (ref 70–99)
Glucose-Capillary: 224 mg/dL — ABNORMAL HIGH (ref 70–99)

## 2021-09-14 LAB — RENAL FUNCTION PANEL
Albumin: 1.6 g/dL — ABNORMAL LOW (ref 3.5–5.0)
Anion gap: 7 (ref 5–15)
BUN: 22 mg/dL — ABNORMAL HIGH (ref 6–20)
CO2: 25 mmol/L (ref 22–32)
Calcium: 7.4 mg/dL — ABNORMAL LOW (ref 8.9–10.3)
Chloride: 108 mmol/L (ref 98–111)
Creatinine, Ser: 0.8 mg/dL (ref 0.61–1.24)
GFR, Estimated: >60 mL/min (ref >60)
Glucose, Bld: 202 mg/dL — ABNORMAL HIGH (ref 70–99)
Phosphorus: 3.3 mg/dL (ref 2.5–4.6)
Potassium: 3.8 mmol/L (ref 3.5–5.1)
Sodium: 140 mmol/L (ref 135–145)

## 2021-09-14 LAB — MAGNESIUM: Magnesium: 2.4 mg/dL (ref 1.7–2.4)

## 2021-09-14 MED ORDER — INSULIN GLARGINE-YFGN 100 UNIT/ML ~~LOC~~ SOLN
20.0000 [IU] | Freq: Every day | SUBCUTANEOUS | Status: DC
  Administered 2021-09-14 – 2021-09-20 (×7): 20 [IU] via SUBCUTANEOUS
  Filled 2021-09-14 (×7): qty 0.2

## 2021-09-14 MED ORDER — FUROSEMIDE 10 MG/ML IJ SOLN
40.0000 mg | Freq: Two times a day (BID) | INTRAMUSCULAR | Status: AC
  Administered 2021-09-14 (×2): 40 mg via INTRAVENOUS
  Filled 2021-09-14 (×2): qty 4

## 2021-09-14 MED ORDER — DEXTROSE 10 % IV SOLN: INTRAVENOUS | Status: DC | PRN

## 2021-09-14 MED ORDER — INSULIN ASPART 100 UNIT/ML IJ SOLN
3.0000 [IU] | INTRAMUSCULAR | Status: DC
  Administered 2021-09-14 – 2021-09-15 (×6): 3 [IU] via SUBCUTANEOUS

## 2021-09-14 NOTE — Progress Notes (Signed)
NAME:  Troy Randall, MRN:  [id number], DOB:  [birth year], LOS: 10 ADMISSION DATE:  09/04/2021, CONSULTATION DATE:  09/06/21 REFERRING MD:  [name], CHIEF COMPLAINT:  melena   History of Present Illness:   Troy Randall is an 61 y.o. male with prior history of HTN, HLD, DM, and CVA on ASA/ plavix who presented to [facility] on 8/31 after developing frequent 6-7 dark stools and feeling dizzy and lightheaded on standing.  No hx of NSAIDs.  FOBT positive and initial Hgb 12.2.    Patient admitted to [facility] with GI consulting.  Remained orthostatic with progressive decline in Hgb.  CTA abdomen did not show any active bleed, therefore underwent colonoscopy > no active bleed found but large clots throughout entire colon.  Then underwent EGD which showed a small hiatal hernia, erosive gastropathy with no stigmata of recent bleeding, and non-bleeding duodenal diverticulum.   Underwent then small bowel enteroscopy which noted intermittent blood pumping in the proximal jejunum but was unable to be reached despite multiple attempts, was injected and clip placed.  Repeat CTA repeated, which showed positive jejunal diverticular bleeding. Patient was transferred to [facility] for IR involvement. In IR active extravasation  at SMA territory, jejunal arcade branch, successful coil embolization.  PCCM was consulted for transfer and admission.  Pertinent  Medical History  HTN, HLD, DM, CVA on ASA/ plavix  Significant Hospital Events: Including procedures, antibiotic start and stop dates in addition to other pertinent events   8/31 presented to [facility], [name] Admit, Gi Consult 9/1 CTA> no evidence of active GI bleed, 2 U PRBC 9/2 2 U PRBC, colonoscopy > no active bleed found but large clots throughout entire colon. EGD> small hiatal hernia, erosive gastropathy with no stigmata of recent bleeding, and non-bleeding duodenal diverticulum.   Underwent then small bowel enteroscopy which noted intermittent blood pumping in the proximal jejunum  but was unable to be reached despite multiple attempts, was injected and clip placed. Repeat CTA repeated, which showed positive jejunal diverticular bleeding. IR> active extrav at [facility] territory, jejunal arcade branch, successful coil embo 9/3 Intubated. CTA> no evidence of active GI bleeding.  New finding of abnormal gallbladder with nondependent air, gallbladder wall air, pericholecystic and right upper quadrant information.  IR placed percutaneous cholecystotomy tube.  9/4 R. IJ central line placed with dialysis triple lumen catheter 9/5 CRRT initiated 9/6 Bedside EGD without any evidence of bleeding. CTA a/p without etiology for bleed 9/8 CRRT discontinued. Required ETT exchange yesterday due to blown cuff. Interim History / Subjective:  Remains off sedation Follows commands with thumbs up Tolerating PS  Objective   Blood pressure (!) 180/79, pulse 94, temperature 98.2 F (36.8 C), resp. rate 18, height 6' (1.829 m), weight (!) 146.1 kg, SpO2 95 %. Vent Mode: PRVC FiO2 (%):  [30 %] 30 % Set Rate:  [18 bmp] 18 bmp Vt Set:  [620 mL] 620 mL PEEP:  [5 cmH20] 5 cmH20 Plateau Pressure:  [18 cmH20-25 cmH20] 25 cmH20   Intake/Output Summary (Last 24 hours) at 09/14/2021 0913 Last data filed at 09/14/2021 0818 Gross per 24 hour  Intake 480.04 ml  Output 4850 ml  Net -4369.96 ml   Filed Weights   09/11/21 0400 09/12/21 0500 09/13/21 0500  Weight: (!) 155.6 kg (!) 154.7 kg (!) 146.1 kg   Physical Exam: General: Chronically ill-appearing, no acute distress HENT: [place], AT, ETT in place Eyes: EOMI, no scleral icterus Respiratory: Clear to auscultation bilaterally.  No crackles, wheezing or rales  Cardiovascular: RRR, -M/R/G, no JVD GI: BS+, soft, nontender Extremities: 1+ pitting edema,-tenderness Neuro: Eyes open, no tracking, follows commands in upper extremities bilaterally, CNII-XII grossly intact GU: Foley in place  CBC and CMET reviewed Cr 0.8 Glucose 215  Resolved Hospital  Problem list   AKI - off CRRT Septic shock Assessment & Plan:   Acute encephalopathy secondary to sepsis History of CVA without residual deficits CT head 9/8 with small vessel infarcts vs inflammatory process Off sedation and following commands, remains encephalopathic -hold asa plavix in setting of acute GI bleed -Unable to obtain MRI. Recent hemostatic clip confirmed to be MRI compatible however endoscopy capsule remains present  Acute hypoxemic respiratory failure Likely in setting of acute illness, shock, and hypervolemia as per above. -Full vent support. On vent since 9/3 -Tolerating PS -SBT/WUA daily. Mental status improving however continues preclude extubation -VAP protocol -Minimize sedation  Hemorrhagic shock - resolved ABLA secondary to lower GI bleed, jejunal diverticular and ulcers secondary to ischemia Acute blood loss anemia, secondary to above - 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 and bedside EGD 9/6 No further bleeding -Daily CBC -Transfuse PRBC if HBG less than 7  Ecoli Cholecystitis s/p percutaneous cholecystostomy drain placement 9/3 percutaneous cholecystostomy drain placed.  Resolved leukocytosis -Complete total 7d of antibiotics: Ceftriaxone  -Continue to monitor drain -Trend fever/WBC -Will need outpatient surgery follow up  AKI  - off CRRT 9/8 Bilateral nonobstructing nephrolithiasis - suspect chronic BPH -Nephrology signed off -Diurese -Trend electrolytes, replete as needed -Hold flomax -Foley catheter in place, strict i/o  Thrombocytopenia - normalized Low suspicion for HIT.  -Trend with daily CBC -Restart DVT ppx  DM2 SSI and Semglee of 15 units.  Will adjust as needed with restarting tube feeds today. -Blood Glucose goal 140-180 -SSI, increase semglee 20U -Add TF coverage  HTN HLD -Hold home antihypertensives and statin  Pulmonary nodules in bilateral bases, L 71mm Family reports patient is a never smoker.  Family does endorse dipping. -no further follow up needed for a low risk patient based on fleischner criteria  Best Practice (right click and "Reselect all SmartList Selections" daily)   Diet/type: TF DVT prophylaxis: prophylactic heparin  GI prophylaxis: PPI Lines: a-line, foley, R. IJ central line Foley:  yes Code Status:  full code Last date of multidisciplinary goals of care discussion [Full scope- Discussed with wife [name] 9/2 . Family updated at bedside 9/10  The patient is critically ill with multiple organ systems failure and requires high complexity decision making for assessment and support, frequent evaluation and titration of therapies, application of advanced monitoring technologies and extensive interpretation of multiple databases.  Independent Critical Care Time: 50 Minutes.   Mechele Collin, M.D. [facility] Pulmonary/Critical Care Medicine 09/14/2021 9:13 AM   Please see Amion for pager number to reach on-call Pulmonary and Critical Care Team.

## 2021-09-14 NOTE — Progress Notes (Signed)
Inpatient Diabetes Program Recommendations  AACE/ADA: New Consensus Statement on Inpatient Glycemic Control (2015)  Target Ranges:  Prepandial:   less than 140 mg/dL      Peak postprandial:   less than 180 mg/dL (1-2 hours)      Critically ill patients:  140 - 180 mg/dL   Lab Results  Component Value Date   GLUCAP 215 (H) 09/14/2021   HGBA1C 6.2 (H) 04/29/2021    Review of Glycemic Control  Latest Reference Range & Units 09/13/21 23:47 09/14/21 03:47 09/14/21 07:47  Glucose-Capillary 70 - 99 mg/dL 114 (H) 643 (H) 142 (H)   Current orders for Inpatient glycemic control:  Semglee 20 units daily (increased today) Novolog 0-15 units q 4 hours Novolog 3 units q 4 hours (started today) Vital 1.5 cal (rate increasing to 60 ml/hr)  Inpatient Diabetes Program Recommendations:   Agree with increase of Semglee and addition of Novolog tube feed coverage.  Will follow.   Thanks,  Beryl Meager, RN, BC-ADM Inpatient Diabetes Coordinator Pager [phone]  (8a-5p)

## 2021-09-14 NOTE — Progress Notes (Signed)
ICU Pharmacy Insulin Protocol  CBGs above goal 140-180 mg/dL: Yes   Current regimen (include units of SSI in last 24hr): on moderate SSI (20 units in last 24 hours), lantus 15 daily   Medications affecting CBG levels: Tube feeds resumed at 20 yesterday and now increasing in rate to 40 ml/hr with plans to advance further to goal 60 ml/hr.    Plan: Continue Moderate SSI insulin Add Tube Feed coverage 3 units every 4 hours.  Increase Semglee to 20 units daily.    Link Snuffer, PharmD, BCPS, BCCCP Clinical Pharmacist Please refer to [facility] for [facility] Pharmacy numbers 09/14/2021, 9:27 AM

## 2021-09-15 ENCOUNTER — Inpatient Hospital Stay (HOSPITAL_COMMUNITY): Payer: Medicare HMO

## 2021-09-15 ENCOUNTER — Encounter (HOSPITAL_COMMUNITY): Payer: Self-pay | Admitting: Gastroenterology

## 2021-09-15 DIAGNOSIS — Z930 Tracheostomy status: Secondary | ICD-10-CM | POA: Diagnosis not present

## 2021-09-15 DIAGNOSIS — Z452 Encounter for adjustment and management of vascular access device: Secondary | ICD-10-CM | POA: Diagnosis not present

## 2021-09-15 DIAGNOSIS — R918 Other nonspecific abnormal finding of lung field: Secondary | ICD-10-CM | POA: Diagnosis not present

## 2021-09-15 DIAGNOSIS — K922 Gastrointestinal hemorrhage, unspecified: Secondary | ICD-10-CM | POA: Diagnosis not present

## 2021-09-15 LAB — CBC WITH DIFFERENTIAL/PLATELET
Abs Immature Granulocytes: 0.13 10*3/uL — ABNORMAL HIGH (ref 0.00–0.07)
Basophils Absolute: 0 10*3/uL (ref 0.0–0.1)
Basophils Relative: 0 %
Eosinophils Absolute: 0.2 10*3/uL (ref 0.0–0.5)
Eosinophils Relative: 2 %
HCT: 29.8 % — ABNORMAL LOW (ref 39.0–52.0)
Hemoglobin: 9.5 g/dL — ABNORMAL LOW (ref 13.0–17.0)
Immature Granulocytes: 1 %
Lymphocytes Relative: 8 %
Lymphs Abs: 0.8 10*3/uL (ref 0.7–4.0)
MCH: 28.8 pg (ref 26.0–34.0)
MCHC: 31.9 g/dL (ref 30.0–36.0)
MCV: 90.3 fL (ref 80.0–100.0)
Monocytes Absolute: 0.5 10*3/uL (ref 0.1–1.0)
Monocytes Relative: 5 %
Neutro Abs: 8 10*3/uL — ABNORMAL HIGH (ref 1.7–7.7)
Neutrophils Relative %: 84 %
Platelets: 273 10*3/uL (ref 150–400)
RBC: 3.3 MIL/uL — ABNORMAL LOW (ref 4.22–5.81)
RDW: 16.7 % — ABNORMAL HIGH (ref 11.5–15.5)
WBC: 9.6 10*3/uL (ref 4.0–10.5)
nRBC: 0 % (ref 0.0–0.2)

## 2021-09-15 LAB — RENAL FUNCTION PANEL
Albumin: 1.6 g/dL — ABNORMAL LOW (ref 3.5–5.0)
Anion gap: 7 (ref 5–15)
BUN: 18 mg/dL (ref 6–20)
CO2: 24 mmol/L (ref 22–32)
Calcium: 7.3 mg/dL — ABNORMAL LOW (ref 8.9–10.3)
Chloride: 112 mmol/L — ABNORMAL HIGH (ref 98–111)
Creatinine, Ser: 0.67 mg/dL (ref 0.61–1.24)
GFR, Estimated: >60 mL/min (ref >60)
Glucose, Bld: 193 mg/dL — ABNORMAL HIGH (ref 70–99)
Phosphorus: 3.1 mg/dL (ref 2.5–4.6)
Potassium: 3.6 mmol/L (ref 3.5–5.1)
Sodium: 143 mmol/L (ref 135–145)

## 2021-09-15 LAB — GLUCOSE, CAPILLARY
Glucose-Capillary: 138 mg/dL — ABNORMAL HIGH (ref 70–99)
Glucose-Capillary: 228 mg/dL — ABNORMAL HIGH (ref 70–99)
Glucose-Capillary: 258 mg/dL — ABNORMAL HIGH (ref 70–99)
Glucose-Capillary: 183 mg/dL — ABNORMAL HIGH (ref 70–99)
Glucose-Capillary: 118 mg/dL — ABNORMAL HIGH (ref 70–99)
Glucose-Capillary: 117 mg/dL — ABNORMAL HIGH (ref 70–99)

## 2021-09-15 LAB — TSH: TSH: 3.484 u[IU]/mL (ref 0.350–4.500)

## 2021-09-15 LAB — IRON AND TIBC
Iron: 37 ug/dL — ABNORMAL LOW (ref 45–182)
Saturation Ratios: 17 % — ABNORMAL LOW (ref 17.9–39.5)
TIBC: 213 ug/dL — ABNORMAL LOW (ref 250–450)
UIBC: 176 ug/dL

## 2021-09-15 LAB — VITAMIN B12: Vitamin B-12: 2017 pg/mL — ABNORMAL HIGH (ref 180–914)

## 2021-09-15 LAB — FERRITIN: Ferritin: 691 ng/mL — ABNORMAL HIGH (ref 24–336)

## 2021-09-15 LAB — MAGNESIUM: Magnesium: 2 mg/dL (ref 1.7–2.4)

## 2021-09-15 MED ORDER — PANTOPRAZOLE 2 MG/ML SUSPENSION
40.0000 mg | Freq: Every day | ORAL | Status: DC
Start: 2021-09-16 — End: 2021-09-24
  Administered 2021-09-16 – 2021-09-23 (×8): 40 mg
  Filled 2021-09-15 (×8): qty 20

## 2021-09-15 MED ORDER — FUROSEMIDE 10 MG/ML IJ SOLN
40.0000 mg | Freq: Two times a day (BID) | INTRAMUSCULAR | Status: AC
  Administered 2021-09-15 (×2): 40 mg via INTRAVENOUS
  Filled 2021-09-15 (×2): qty 4

## 2021-09-15 MED ORDER — LIDOCAINE-EPINEPHRINE (PF) 2 %-1:200000 IJ SOLN
INTRAMUSCULAR | Status: AC
  Filled 2021-09-15: qty 20

## 2021-09-15 MED ORDER — HEPARIN SODIUM (PORCINE) 5000 UNIT/ML IJ SOLN: 5000.0000 [IU] | Freq: Three times a day (TID) | INTRAMUSCULAR | Status: DC

## 2021-09-15 MED ORDER — BISACODYL 10 MG RE SUPP
10.0000 mg | Freq: Once | RECTAL | Status: AC
Start: 2021-09-15 — End: 2021-09-15
  Administered 2021-09-15: 10 mg via RECTAL
  Filled 2021-09-15: qty 1

## 2021-09-15 MED ORDER — AMLODIPINE BESYLATE 5 MG PO TABS
5.0000 mg | ORAL_TABLET | Freq: Every day | ORAL | Status: DC
  Administered 2021-09-15: 5 mg
  Filled 2021-09-15: qty 1

## 2021-09-15 MED ORDER — FENTANYL CITRATE PF 50 MCG/ML IJ SOSY
50.0000 ug | PREFILLED_SYRINGE | INTRAMUSCULAR | Status: DC | PRN
  Administered 2021-09-15 – 2021-09-16 (×3): 100 ug via INTRAVENOUS
  Filled 2021-09-15 (×3): qty 2

## 2021-09-15 MED ORDER — HEPARIN SODIUM (PORCINE) 5000 UNIT/ML IJ SOLN
5000.0000 [IU] | Freq: Three times a day (TID) | INTRAMUSCULAR | Status: DC
  Administered 2021-09-16 – 2021-09-22 (×19): 5000 [IU] via SUBCUTANEOUS
  Filled 2021-09-15 (×19): qty 1

## 2021-09-15 MED ORDER — ASPIRIN 81 MG PO CHEW
81.0000 mg | CHEWABLE_TABLET | Freq: Every day | ORAL | Status: DC
  Administered 2021-09-15 – 2021-09-21 (×7): 81 mg
  Filled 2021-09-15 (×7): qty 1

## 2021-09-15 MED ORDER — INSULIN ASPART 100 UNIT/ML IJ SOLN
8.0000 [IU] | INTRAMUSCULAR | Status: DC
  Administered 2021-09-15 (×2): 8 [IU] via SUBCUTANEOUS

## 2021-09-15 MED ORDER — FENTANYL CITRATE PF 50 MCG/ML IJ SOSY
PREFILLED_SYRINGE | INTRAMUSCULAR | Status: AC
  Filled 2021-09-15: qty 2

## 2021-09-15 MED ORDER — POTASSIUM CHLORIDE 10 MEQ/100ML IV SOLN
10.0000 meq | INTRAVENOUS | Status: AC
  Administered 2021-09-15 (×4): 10 meq via INTRAVENOUS
  Filled 2021-09-15 (×4): qty 100

## 2021-09-15 MED ORDER — ROCURONIUM BROMIDE 50 MG/5ML IV SOLN
100.0000 mg | Freq: Once | INTRAVENOUS | Status: AC
  Administered 2021-09-15: 100 mg via INTRAVENOUS
  Filled 2021-09-15: qty 10

## 2021-09-15 MED ORDER — MIDAZOLAM HCL 2 MG/2ML IJ SOLN
5.0000 mg | Freq: Once | INTRAMUSCULAR | Status: AC
  Administered 2021-09-15: 4 mg via INTRAVENOUS
  Filled 2021-09-15: qty 6

## 2021-09-15 MED ORDER — ROCURONIUM BROMIDE 10 MG/ML (PF) SYRINGE
PREFILLED_SYRINGE | INTRAVENOUS | Status: AC
  Filled 2021-09-15: qty 10

## 2021-09-15 MED ORDER — FENTANYL CITRATE PF 50 MCG/ML IJ SOSY
200.0000 ug | PREFILLED_SYRINGE | Freq: Once | INTRAMUSCULAR | Status: AC
  Administered 2021-09-15: 200 ug via INTRAVENOUS
  Filled 2021-09-15: qty 4

## 2021-09-15 MED ORDER — ETOMIDATE 2 MG/ML IV SOLN
20.0000 mg | Freq: Once | INTRAVENOUS | Status: AC
  Administered 2021-09-15: 20 mg via INTRAVENOUS
  Filled 2021-09-15: qty 10

## 2021-09-15 NOTE — Procedures (Signed)
Diagnostic Bronchoscopy  Troy Randall  [id number]  25-May-1960  Date:09/15/21  Time:2:42 PM   Provider Performing:Navneet Schmuck Mechele Collin, MD  Procedure: Diagnostic Bronchoscopy [phone])  Indication(s) Assist with direct visualization of tracheostomy placement  Consent Risks of the procedure as well as the alternatives and risks of each were explained to the patient and/or caregiver.  Consent for the procedure was obtained.   Anesthesia See separate tracheostomy note   Time Out Verified patient identification, verified procedure, site/side was marked, verified correct patient position, special equipment/implants available, medications/allergies/relevant history reviewed, required imaging and test results available.   Sterile Technique Usual hand hygiene, masks, gowns, and gloves were used   Procedure Description Bronchoscope advanced through endotracheal tube and into airway.  After suctioning out tracheal secretions, bronchoscope used to provide direct visualization of tracheostomy placement.   Complications/Tolerance None; patient tolerated the procedure well.   EBL None  Specimen(s) None

## 2021-09-15 NOTE — Progress Notes (Signed)
Pt placed on PS/CPAP 8/5 on 30% and is tolerating well. RT will monitor. 

## 2021-09-15 NOTE — Inpatient Diabetes Management (Signed)
Inpatient Diabetes Program Recommendations  AACE/ADA: New Consensus Statement on Inpatient Glycemic Control (2015)  Target Ranges:  Prepandial:   less than 140 mg/dL      Peak postprandial:   less than 180 mg/dL (1-2 hours)      Critically ill patients:  140 - 180 mg/dL   Lab Results  Component Value Date   GLUCAP 228 (H) 09/15/2021   HGBA1C 6.2 (H) 04/29/2021    Review of Glycemic Control  Latest Reference Range & Units 09/14/21 19:22 09/14/21 23:14 09/15/21 03:19 09/15/21 08:18  Glucose-Capillary 70 - 99 mg/dL 767 (H) 209 (H) 470 (H) 228 (H)  (H): Data is abnormally high Current orders for Inpatient glycemic control:  Semglee 20 units daily  Novolog 0-15 units q 4 hours Novolog 3 units q 4 hours  Vital 1.5 cal @ 60 ml/hr   Inpatient Diabetes Program Recommendations:    Consider increasing tube feed coverage to Novolog 8 units Q4H (to be stopped or held in the event tube feeds stopped).   Thanks, Lujean Rave, MSN, RNC-OB Diabetes Coordinator [phone] (8a-5p)

## 2021-09-15 NOTE — Progress Notes (Signed)
ICU Pharmacy Insulin Protocol  CBGs above goal 140-180 mg/dL: No > 967    Current regimen (include units of SSI in last 24hr): on moderate SSI (28 units in last 24 hours), lantus 20 daily, lispro 3 Q4H    Medications affecting CBG levels: Tube feeds running at goal    Plan: Continue Moderate SSI insulin Increase Tube Feed coverage 8 units every 4 hours.  Continue Semglee 20 units daily.

## 2021-09-15 NOTE — Progress Notes (Signed)
Referring Physician(s): Dr. Silvestre Gunner   Supervising Physician: Roanna Banning  Patient Status:  Troy [facility] - In-pt  Chief Complaint: GI Bleed and acute cholecystitis s/p embolization 09/06/21 and percutaneous cholecystostomy 09/07/21  Subjective: Sedated/ventilator. Critical care team just placed tracheostomy.   Allergies: Metformin and related and Penicillins  Medications: Prior to Admission medications   Medication Sig Start Date End Date Taking? Authorizing Provider  amLODipine (NORVASC) 10 MG tablet Take 1 tablet (10 mg total) by mouth daily. 06/15/17  Yes Aliene Beams, MD  aspirin EC 81 MG tablet Take 81 mg by mouth daily. Swallow whole.   Yes [provider]  clopidogrel (PLAVIX) 75 MG tablet Take 1 tablet (75 mg total) by mouth daily. 04/30/21  Yes Azucena Fallen, MD  glipiZIDE (GLUCOTROL XL) 10 MG 24 hr tablet Take 1 tablet (10 mg total) by mouth daily with breakfast. 04/16/17  Yes Hagler, Fleet Contras, MD  levocetirizine (XYZAL) 5 MG tablet Take 1 tablet by mouth at bedtime.   Yes [provider]  lisinopril (PRINIVIL,ZESTRIL) 40 MG tablet Take 1 tablet (40 mg total) by mouth daily. 05/12/17  Yes Aliene Beams, MD  metFORMIN (GLUCOPHAGE-XR) 500 MG 24 hr tablet Take 500-1,000 mg by mouth in the morning and at bedtime. 02/09/21  Yes [provider]  rosuvastatin (CRESTOR) 40 MG tablet Take 1 tablet (40 mg total) by mouth daily. 04/29/21  Yes Azucena Fallen, MD  tamsulosin (FLOMAX) 0.4 MG CAPS capsule Take 0.4 mg by mouth daily. 02/12/21  Yes [provider]  traZODone (DESYREL) 150 MG tablet Take 150 mg by mouth at bedtime. 02/04/21  Yes [provider]  TRESIBA FLEXTOUCH 200 UNIT/ML FlexTouch Pen Inject 20 Units into the skin in the morning and at bedtime. 03/14/21  Yes [provider]  BD VEO INSULIN SYRINGE U/F 31G X 15/64" 1 ML MISC  USE AS DIRECTED 06/08/17   Aliene Beams, MD  glucose blood (ONETOUCH VERIO) test strip TEST twice a day  05/04/17   Aliene Beams, MD  Insulin Pen Needle (NOVOTWIST) 32G X 5 MM MISC Use two daily to inject Victoza and Toujeo. 04/25/15   Reather Littler, MD  [facility] DELICA LANCETS 33G MISC Use to check blood sugar once a day dx code E11.65 11/21/14   Reather Littler, MD     Vital Signs: BP (!) 147/79   Pulse 93   Temp 98.3 F (36.8 C) (Axillary)   Resp 19   Ht 6' (1.829 m)   Wt (!) 305 lb 8.9 oz (138.6 kg)   SpO2 95%   BMI 41.44 kg/m   Physical Exam Constitutional:      Comments: Sedated/ventilator   Pulmonary:     Comments: Patient just now received trach placement. Sedated/ventilator.  Abdominal:     Comments: RUQ drain to gravity. Dressing is clean/dry. Drain easily flushed with 10 ml NS. Approximately 350 ml dark green bile in bag.   Skin:    General: Skin is warm and dry.     Imaging: DG Abd 1 View  Result Date: 09/13/2021 CLINICAL DATA:  Lower GI bleed EXAM: ABDOMEN - 1 VIEW COMPARISON:  09/12/2021 FINDINGS: Enteric tube has been advanced with distal tip now likely within the second portion of the duodenum. Cholecystostomy tube projects over the right upper quadrant. Similar degree of gaseous distension of the colon. No dilated loops of small bowel. No gross free intraperitoneal air on supine view. There is a 1.3 cm radiodensity within the low pelvis which was seen  within the rectum on previous CT scan. IMPRESSION: 1. Enteric tube has been advanced with distal tip now likely within the second portion of the duodenum. 2. Similar degree of gaseous distension of the colon. 3. Cholecystostomy tube projects over the right upper quadrant. Electronically Signed   By: Duanne Guess D.O.   On: 09/13/2021 10:14   DG Abd Portable 1V  Result Date: 09/12/2021 CLINICAL DATA:  Evaluate tip of enteric tube EXAM: PORTABLE ABDOMEN - 1 VIEW COMPARISON:  Previous studies including the examination of 09/10/2021 FINDINGS: Tip of enteric tube is noted in region of medial aspect of fundus of the stomach  slightly below the level of the gastroesophageal junction. There is moderate amount of gas in colon. There is a percutaneous drain in right upper quadrant of abdomen. IMPRESSION: Tip of enteric tube is seen in the medial aspect of fundus of the stomach slightly below the level of gastroesophageal junction. Electronically Signed   By: Ernie Avena M.D.   On: 09/12/2021 17:46   DG CHEST PORT 1 VIEW  Result Date: 09/12/2021 CLINICAL DATA:  Difficulty breathing EXAM: PORTABLE CHEST 1 VIEW COMPARISON:  Previous studies including the examination of 09/08/2021 FINDINGS: Tip of endotracheal tube is 3.9 cm above the carina. Enteric tube is noted traversing the esophagus. Tip of right IJ dialysis catheter is seen in superior vena cava. Tip of right extremity PICC line is seen in the region of right atrium. Apparent shift of mediastinum to the right may be due to rotation. There are linear densities in left lower lung field suggesting subsegmental atelectasis. There is improvement in aeration of left lower lung field. There are no new infiltrates or signs of alveolar pulmonary edema. There is no significant pleural effusion or pneumothorax. IMPRESSION: There is improvement in the aeration of medial left lower lung field suggesting decrease in atelectasis/pneumonia with residual subsegmental atelectasis. There are no signs of pulmonary edema. Electronically Signed   By: Ernie Avena M.D.   On: 09/12/2021 17:27   CT HEAD WO CONTRAST ( )  Result Date: 09/12/2021 CLINICAL DATA:  Stroke follow-up EXAM: CT HEAD WITHOUT CONTRAST TECHNIQUE: Contiguous axial images were obtained from the base of the skull through the vertex without intravenous contrast. RADIATION DOSE REDUCTION: This exam was performed according to the departmental dose-optimization program which includes automated exposure control, adjustment of the mA and/or kV according to patient size and/or use of iterative reconstruction technique.  COMPARISON:  04/28/2021 FINDINGS: Brain: Rapid increase in number of white matter insults which are scattered with rounded appearance. Have been prior basal ganglia infarcts based on previous study. Brain volume is normal. No cortical infarct, hemorrhage, hydrocephalus, or mass effect. Vascular: Unremarkable Skull: Negative Sinuses/Orbits: Negative IMPRESSION: 1. Multiple interval white matter insults which could be small vessel infarcts or inflammatory process, suggest brain MRI with contrast. 2. Chronic small vessel disease. Electronically Signed   By: Tiburcio Pea M.D.   On: 09/12/2021 12:24    Labs:  CBC: Recent Labs    09/12/21 0308 09/13/21 0327 09/14/21 0504 09/15/21 0549  WBC 13.8* 10.4 10.5 9.6  HGB 10.2* 9.6* 9.9* 9.5*  HCT 30.8* 30.0* 30.7* 29.8*  PLT 63* 108* 194 273    COAGS: Recent Labs    04/28/21 1504 09/05/21 0307 09/06/21 2240 09/07/21 2056  INR 1.0 1.2 1.6* 2.0*  APTT 26  --   --  32    BMP: Recent Labs    09/12/21 0308 09/13/21 0327 09/14/21 0504 09/15/21 0549  NA 142 140  140 143  K 3.6 3.4* 3.8 3.6  CL 108 106 108 112*  CO2 26 26 25 24   GLUCOSE 110* 159* 202* 193*  BUN 21* 26* 22* 18  CALCIUM 7.1* 7.2* 7.4* 7.3*  CREATININE 0.75 0.79 0.80 0.67  GFRNONAA >60 >60 >60 >60    LIVER FUNCTION TESTS: Recent Labs    04/29/21 0502 09/04/21 1238 09/06/21 2240 09/07/21 2054 09/08/21 1808 09/12/21 0308 09/13/21 0327 09/14/21 0504 09/15/21 0549  BILITOT 0.5 0.8 0.4 0.5  --   --   --   --   --   AST 20 15 21  33  --   --   --   --   --   ALT 22 17 16  32  --   --   --   --   --   ALKPHOS 34* 35* 21* 124  --   --   --   --   --   PROT 6.8 6.9 3.7* 3.2*  --   --   --   --   --   ALBUMIN 3.6 3.7 1.8* <1.5*   < > <1.5* <1.5* 1.6* 1.6*   < > = values in this interval not displayed.    Assessment and Plan:  GI Bleed and acute cholecystitis s/p embolization 09/06/21 and percutaneous cholecystostomy 09/07/21  Drain Location: RUQ Size: Fr size: 10  Fr Date of placement: 09/07/21 Currently to: Drain collection device: gravity 24 hour output:  Output by Drain (mL) 09/13/21 0700 - 09/13/21 1459 09/13/21 1500 - 09/13/21 2259 09/13/21 2300 - 09/14/21 0659 09/14/21 0700 - 09/14/21 1459 09/14/21 1500 - 09/14/21 2259 09/14/21 2300 - 09/15/21 0659 09/15/21 0700 - 09/15/21 1425  Closed System Drain 1 Lateral RUQ Other (Comment) 10.2 Fr.  250  275 160 160     Interval imaging/drain manipulation:  None  Current examination: 350 ml of dark green bile in gravity bag Flushes/aspirates easily.  Insertion site unremarkable. Suture and stat lock in place. Dressed appropriately.   Plan: Continue TID flushes with 5 cc NS. Record output Q shift. Dressing changes QD or PRN if soiled.  Call IR APP or on call IR MD if difficulty flushing or sudden change in drain output.  Repeat imaging/possible drain injection once output < 10 mL/QD (excluding flush material). Consideration for drain removal if output is < 10 mL/QD (excluding flush material), pending discussion with the providing surgical service.  Discharge planning: Percutaneous cholecystostomy drain to remain in place at least 6 weeks. Recommend fluoroscopy with injection of the drain in IR to evaluate for patency of the cystic duct. If the duct is patent and general surgery feels patient is stable for cholecystectomy, the drain would be removed at time of surgery. If the duct is patent and general surgery feels patient is NEVER a candidate for cholecystectomy, drain can be capped for a trial. If symptoms recur, then place to gravity bag again. If trial is successful, discuss possible removal of the drain.  IR will continue to follow - please call with questions or concerns.  Electronically Signed: Soyla Dryer, AGACNP-BC [phone] 09/15/2021, 11:12 AM   I spent a total of 15 Minutes at the the patient's bedside AND on the patient's [facility] floor or unit, greater than 50% of which was  counseling/coordinating care for percutaneous cholecystostomy

## 2021-09-15 NOTE — Progress Notes (Signed)
Nutrition Follow-up  DOCUMENTATION CODES:   Obesity unspecified  INTERVENTION:   Once Cortrak is placed, recommend initiating enteral nutrition: Vital 1.5 at 60 ml/h (1440 ml per day) Prosource TF20 60 ml BID  Provides 2380 kcal, 189 gm protein, 1003 ml free water daily  NUTRITION DIAGNOSIS:   Increased nutrient needs related to acute illness as evidenced by estimated needs. - Ongoing   GOAL:   Patient will meet greater than or equal to 90% of their needs - Ongoing  MONITOR:   TF tolerance  REASON FOR ASSESSMENT:   Consult Enteral/tube feeding initiation and management  ASSESSMENT:   Pt with PMH of HTN, HLD, DM, CVA on ASA/plavix admitted 8/31 from [facility] due to gi bleed.  8/31 admit 9/2 s/p colonoscopy: no active bleed but large clots throughout entire colon; s/p EGD: small hital hernia, erosive gastropathy, non-bleeding duodenal diverticulum; s/p small bowel enteroscopy: jejunal diverticular bleeding s/p coil embolization  9/3 intubated; new cholecystitis s/p IR for cholecystectomy tube  9/4 CRRT started  9/8 CRRT stopped  9/11 trach placed  Pt currently with no access for enteral nutrition. Discussed with MD, plan for Cortrak tomorrow.  Discussed with RN at bedside. RN unsure if pt had been receiving full feeds. Per flowsheet, pt had been receiving TF at goal this morning.   Patient is currently on ventilator support via trach. MV: 10.9 L/min Temp (24hrs), Avg:98.2 F (36.8 C), Min:97.8 F (36.6 C), Max:98.6 F (37 C)  Medications reviewed and include: Colace, Lasix, NovoLog SSI + 8 units q4h, Semglee, Protonix, Miralax Labs reviewed: 24 hr CBG 138-258  UOP: 3925 mL x 24 hours RUQ drain: 595 mL x 24 hours   Diet Order:   Diet Order             Diet NPO time specified  Diet effective now                   EDUCATION NEEDS:   No education needs have been identified at this time  Skin:  Skin Assessment: Reviewed RN Assessment  Last BM:  9/11 -  Type 7  Height:   Ht Readings from Last 1 Encounters:  09/11/21 6' (1.829 m)    Weight:   Wt Readings from Last 1 Encounters:  09/15/21 (!) 138.6 kg    BMI:  Body mass index is 41.44 kg/m.  Estimated Nutritional Needs:   Kcal:  2200-2500  Protein:  130-150 grams  Fluid:  >2 L/day    Kirby Crigler RD, LDN Clinical Dietitian See [facility] for contact information.

## 2021-09-15 NOTE — Progress Notes (Signed)
SLP Cancellation Note  Patient Details Name: Troy Randall MRN: [id number] DOB: [birth year]   Cancelled treatment:       Reason Eval/Treat Not Completed: Patient not medically ready. Patient with new tracheostomy. Orders for SLP eval and treat for PMSV and swallowing received. Will follow pt closely for readiness for SLP interventions as appropriate.     Loriel Diehl, Riley Nearing 09/15/2021, 2:41 PM

## 2021-09-15 NOTE — Progress Notes (Signed)
eLink Physician-Brief Progress Note Patient Name: MARTELL MCFADYEN DOB: 01-31-1960 MRN: [id number]   Date of Service  09/15/2021  HPI/Events of Note  Received request for wrist restraints.  Pt is s/p tracheostomy today.  eICU Interventions  Wrist restraints ordered.     Intervention Category Minor Interventions: Other:  Larinda Buttery 09/15/2021, 8:16 PM

## 2021-09-15 NOTE — Procedures (Addendum)
Percutaneous Tracheostomy Procedure Note   Troy Randall  [id number]  09/07/1960  Date:09/15/21  Time:2:35 PM   Provider Performing:Arlando Leisinger C Katrinka Blazing and Thompson Caul MS4  Procedure: Percutaneous Tracheostomy with Bronchoscopic Guidance (86578)  Indication(s) Persistent resp failure  Consent Risks of the procedure as well as the alternatives and risks of each were explained to the patient and/or caregiver.  Consent for the procedure was obtained.  Anesthesia Etomidate, Versed, Fentanyl, Vecuronium   Time Out Verified patient identification, verified procedure, site/side was marked, verified correct patient position, special equipment/implants available, medications/allergies/relevant history reviewed, required imaging and test results available.   Sterile Technique Maximal sterile technique including sterile barrier drape, hand hygiene, sterile gown, sterile gloves, mask, hair covering.    Procedure Description Appropriate anatomy identified by palpation.  Patient's neck prepped and draped in sterile fashion.  1% lidocaine with epinephrine was used to anesthetize skin overlying neck.  1.5cm incision made and blunt dissection performed until tracheal rings could be easily palpated.   Then a size 6-0 Shiley tracheostomy was placed under bronchoscopic visualization using usual Seldinger technique and serial dilation.   Bronchoscope confirmed placement above the carina and assured that balloon was within tracheal lumen.  Tracheostomy was sutured in place with adhesive pad to protect skin under pressure.    Patient connected to ventilator.   Complications/Tolerance None; patient tolerated the procedure well. Chest X-ray is ordered to confirm no post-procedural complication.   EBL Minimal   Specimen(s) None

## 2021-09-15 NOTE — Progress Notes (Signed)
NAME:  Troy Randall, MRN:  [id number], DOB:  21-Jul-1960, LOS: 11 ADMISSION DATE:  09/04/2021, CONSULTATION DATE:  09/06/21 REFERRING MD:  [name], CHIEF COMPLAINT:  melena   History of Present Illness:   Troy Randall is an 61 y.o. male with prior history of HTN, HLD, DM, and CVA on ASA/ plavix who presented to [facility] on 8/31 after developing frequent 6-7 dark stools and feeling dizzy and lightheaded on standing.  No hx of NSAIDs.  FOBT positive and initial Hgb 12.2.    Patient admitted to [facility] with GI consulting.  Remained orthostatic with progressive decline in Hgb.  CTA abdomen did not show any active bleed, therefore underwent colonoscopy > no active bleed found but large clots throughout entire colon.  Then underwent EGD which showed a small hiatal hernia, erosive gastropathy with no stigmata of recent bleeding, and non-bleeding duodenal diverticulum.   Underwent then small bowel enteroscopy which noted intermittent blood pumping in the proximal jejunum but was unable to be reached despite multiple attempts, was injected and clip placed.  Repeat CTA repeated, which showed positive jejunal diverticular bleeding. Patient was transferred to [facility] for IR involvement. In IR active extravasation  at SMA territory, jejunal arcade branch, successful coil embolization.  PCCM was consulted for transfer and admission.  Pertinent  Medical History  HTN, HLD, DM, CVA on ASA/ plavix  Significant Hospital Events: Including procedures, antibiotic start and stop dates in addition to other pertinent events   8/31 presented to [facility], [name] Admit, Gi Consult 9/1 CTA> no evidence of active GI bleed, 2 U PRBC 9/2 2 U PRBC, colonoscopy > no active bleed found but large clots throughout entire colon. EGD> small hiatal hernia, erosive gastropathy with no stigmata of recent bleeding, and non-bleeding duodenal diverticulum.   Underwent then small bowel enteroscopy which noted intermittent blood pumping in the proximal jejunum  but was unable to be reached despite multiple attempts, was injected and clip placed. Repeat CTA repeated, which showed positive jejunal diverticular bleeding. IR> active extrav at [facility] territory, jejunal arcade branch, successful coil embo 9/3 Intubated. CTA> no evidence of active GI bleeding.  New finding of abnormal gallbladder with nondependent air, gallbladder wall air, pericholecystic and right upper quadrant information.  IR placed percutaneous cholecystotomy tube.  9/4 R. IJ central line placed with dialysis triple lumen catheter 9/5 CRRT initiated 9/6 Bedside EGD without any evidence of bleeding. CTA a/p without etiology for bleed 9/8 CRRT discontinued. Required ETT exchange yesterday due to blown cuff. Interim History / Subjective:  Slightly more alert today, minimally following commands. Patient with BM, however, no evidence of endoscopy capsule per nursing staff.   Objective   Blood pressure (!) 151/83, pulse 77, temperature 97.8 F (36.6 C), temperature source Oral, resp. rate 20, height 6' (1.829 m), weight (!) 138.6 kg, SpO2 95 %. Vent Mode: PRVC FiO2 (%):  [30 %] 30 % Set Rate:  [18 bmp] 18 bmp Vt Set:  [620 mL] 620 mL PEEP:  [5 cmH20] 5 cmH20 Pressure Support:  [10 cmH20] 10 cmH20 Plateau Pressure:  [18 cmH20-19 cmH20] 19 cmH20   Intake/Output Summary (Last 24 hours) at 09/15/2021 0554 Last data filed at 09/15/2021 0000 Gross per 24 hour  Intake 1153.44 ml  Output 3660 ml  Net -2506.56 ml    Filed Weights   09/12/21 0500 09/13/21 0500 09/15/21 0500  Weight: (!) 154.7 kg (!) 146.1 kg (!) 138.6 kg   Physical Exam: General: Chronically ill-appearing, no acute distress HENT:  Peck, AT, ETT in place Eyes: EOMI, no scleral icterus Respiratory: Clear to auscultation bilaterally.  No crackles, wheezing or rales Cardiovascular: RRR, -M/R/G, no JVD GI: BS+, soft, nontender Extremities: 1+ pitting edema, Neuro: Eyes open, not tracking. Following minimal commands with right  side, minimal movement of left.  GU: Foley in place  Resolved Hospital Problem list   AKI - off CRRT Septic shock Assessment & Plan:   Acute encephalopathy secondary to sepsis History of CVA without residual deficits CT head 9/8 with small vessel infarcts vs inflammatory process Off sedation and minimally following commands, remains encephalopathic. Concerned for CVA with limited left sided movement and not tracking. No GI bleed since restarting heparin. Consider adding back aspirin with concern for CVA.  -Monitor mental status and avoid sedating medications -Restart aspirin -Obtain MRI when able. If does not pass in stool, repeat pelvic xray tomorrow  Acute hypoxemic respiratory failure Likely in setting of acute illness, shock, and hypervolemia as per above. Intubated around 7 days, will talk with family about tracheostomy placement with ongoing encephalopathy.  -Continued encephalopathy, consider tracheostomy placement  -Full vent support. On vent since 9/3 -Tolerating PS -SBT/WUA daily. -VAP protocol -Minimize sedation  Hemorrhagic shock - resolved ABLA secondary to lower GI bleed, jejunal diverticular and ulcers secondary to ischemia Acute blood loss anemia, secondary to above - 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 and bedside EGD 9/6 No further bleeding. Awaiting passing of endoscopy capsule.  -Daily CBC -Iron studies pending -Transfuse PRBC if HBG less than 7  Ecoli Cholecystitis s/p percutaneous cholecystostomy drain placement 9/3 percutaneous cholecystostomy drain placed.  Resolved leukocytosis and has remained afebrile -Completed total 7d of antibiotics: Ceftriaxone  -Continue to monitor drain -Trend fever/WBC -Will need outpatient surgery and IR follow up  AKI  - off CRRT 9/8 Bilateral nonobstructing nephrolithiasis - suspect chronic BPH Cr stable and continues to be hypervolemic. Will continue lasix dosing -Nephrology no longer  following -Diurese with lasix 40 BID -Trend electrolytes, replete as needed -Hold flomax -Foley catheter in place, strict i/o  Thrombocytopenia - normalized Low suspicion for HIT.  -Trend with daily CBC -on heparin ggt  DM2 SSI q4h, novolog 3U q4h, and semglee 20 u daily -Blood Glucose goal 140-180 -Adjust as needed to meet goal above  HTN HLD -Restart amlodipine 5 mg daily -Hold statin  Pulmonary nodules in bilateral bases, L 17mm Family reports patient is a never smoker. Family does endorse dipping. -no further follow up needed for a low risk patient based on fleischner criteria  Best Practice (right click and "Reselect all SmartList Selections" daily)   Diet/type: TF DVT prophylaxis: prophylactic heparin  GI prophylaxis: PPI Lines: a-line, foley, R. IJ central line Foley:  yes Code Status:  full code Last date of multidisciplinary goals of care discussion [Full scope- Discussed with wife [name] 9/2 . Family updated at bedside 9/10  Thalia Bloodgood DO  Internal Medicine Resident PGY-3 [place]  Pager: [phone]

## 2021-09-16 ENCOUNTER — Inpatient Hospital Stay (HOSPITAL_COMMUNITY): Payer: Medicare HMO

## 2021-09-16 DIAGNOSIS — K6389 Other specified diseases of intestine: Secondary | ICD-10-CM | POA: Diagnosis not present

## 2021-09-16 DIAGNOSIS — R140 Abdominal distension (gaseous): Secondary | ICD-10-CM | POA: Diagnosis not present

## 2021-09-16 DIAGNOSIS — Z4682 Encounter for fitting and adjustment of non-vascular catheter: Secondary | ICD-10-CM | POA: Diagnosis not present

## 2021-09-16 DIAGNOSIS — K922 Gastrointestinal hemorrhage, unspecified: Secondary | ICD-10-CM | POA: Diagnosis not present

## 2021-09-16 LAB — CBC WITH DIFFERENTIAL/PLATELET
Abs Immature Granulocytes: 0.1 10*3/uL — ABNORMAL HIGH (ref 0.00–0.07)
Basophils Absolute: 0 10*3/uL (ref 0.0–0.1)
Basophils Relative: 0 %
Eosinophils Absolute: 0.2 10*3/uL (ref 0.0–0.5)
Eosinophils Relative: 2 %
HCT: 32.5 % — ABNORMAL LOW (ref 39.0–52.0)
Hemoglobin: 10 g/dL — ABNORMAL LOW (ref 13.0–17.0)
Immature Granulocytes: 1 %
Lymphocytes Relative: 9 %
Lymphs Abs: 1 10*3/uL (ref 0.7–4.0)
MCH: 28.6 pg (ref 26.0–34.0)
MCHC: 30.8 g/dL (ref 30.0–36.0)
MCV: 92.9 fL (ref 80.0–100.0)
Monocytes Absolute: 0.6 10*3/uL (ref 0.1–1.0)
Monocytes Relative: 6 %
Neutro Abs: 8.6 10*3/uL — ABNORMAL HIGH (ref 1.7–7.7)
Neutrophils Relative %: 82 %
Platelets: 328 10*3/uL (ref 150–400)
RBC: 3.5 MIL/uL — ABNORMAL LOW (ref 4.22–5.81)
RDW: 16.3 % — ABNORMAL HIGH (ref 11.5–15.5)
WBC: 10.5 10*3/uL (ref 4.0–10.5)
nRBC: 0 % (ref 0.0–0.2)

## 2021-09-16 LAB — GLUCOSE, CAPILLARY
Glucose-Capillary: 146 mg/dL — ABNORMAL HIGH (ref 70–99)
Glucose-Capillary: 153 mg/dL — ABNORMAL HIGH (ref 70–99)
Glucose-Capillary: 177 mg/dL — ABNORMAL HIGH (ref 70–99)
Glucose-Capillary: 187 mg/dL — ABNORMAL HIGH (ref 70–99)
Glucose-Capillary: 163 mg/dL — ABNORMAL HIGH (ref 70–99)
Glucose-Capillary: 172 mg/dL — ABNORMAL HIGH (ref 70–99)

## 2021-09-16 LAB — BASIC METABOLIC PANEL
Anion gap: 8 (ref 5–15)
BUN: 19 mg/dL (ref 6–20)
CO2: 25 mmol/L (ref 22–32)
Calcium: 8 mg/dL — ABNORMAL LOW (ref 8.9–10.3)
Chloride: 110 mmol/L (ref 98–111)
Creatinine, Ser: 0.83 mg/dL (ref 0.61–1.24)
GFR, Estimated: >60 mL/min (ref >60)
Glucose, Bld: 160 mg/dL — ABNORMAL HIGH (ref 70–99)
Potassium: 3.8 mmol/L (ref 3.5–5.1)
Sodium: 143 mmol/L (ref 135–145)

## 2021-09-16 LAB — PHOSPHORUS: Phosphorus: 3.4 mg/dL (ref 2.5–4.6)

## 2021-09-16 LAB — MAGNESIUM: Magnesium: 2.1 mg/dL (ref 1.7–2.4)

## 2021-09-16 MED ORDER — FENTANYL CITRATE PF 50 MCG/ML IJ SOSY
50.0000 ug | PREFILLED_SYRINGE | INTRAMUSCULAR | Status: DC | PRN
  Administered 2021-09-17: 50 ug via INTRAVENOUS
  Filled 2021-09-16: qty 2

## 2021-09-16 MED ORDER — AMLODIPINE BESYLATE 10 MG PO TABS
10.0000 mg | ORAL_TABLET | Freq: Every day | ORAL | Status: DC
  Administered 2021-09-17 – 2021-10-02 (×16): 10 mg
  Filled 2021-09-16 (×17): qty 1

## 2021-09-16 MED ORDER — FUROSEMIDE 10 MG/ML IJ SOLN
40.0000 mg | Freq: Two times a day (BID) | INTRAMUSCULAR | Status: AC
  Administered 2021-09-16 (×2): 40 mg via INTRAVENOUS
  Filled 2021-09-16 (×2): qty 4

## 2021-09-16 MED ORDER — AMLODIPINE BESYLATE 5 MG PO TABS
5.0000 mg | ORAL_TABLET | Freq: Once | ORAL | Status: AC
  Administered 2021-09-16: 5 mg via ORAL
  Filled 2021-09-16: qty 1

## 2021-09-16 MED ORDER — POTASSIUM CHLORIDE 20 MEQ PO PACK
40.0000 meq | PACK | Freq: Once | ORAL | Status: AC
  Administered 2021-09-16: 40 meq
  Filled 2021-09-16: qty 2

## 2021-09-16 NOTE — Procedures (Signed)
Cortrak  Person Inserting Tube:  Osa Craver, RD Tube Type:  Cortrak - 43 inches Tube Size:  10 Tube Location:  Left nare Secured by: Bridle Technique Used to Measure Tube Placement:  Marking at nare/corner of mouth Cortrak Secured At:  77 cm Procedure Comments:  Cortrak Tube Team Note:  Consult received to place a Cortrak feeding tube.   X-ray is required, abdominal x-ray has been ordered by the Cortrak team. Please confirm tube placement before using the Cortrak tube.   If the tube becomes dislodged please keep the tube and contact the Cortrak team at [email or web address] (password [name]) for replacement.  If after hours and replacement cannot be delayed, place a NG tube and confirm placement with an abdominal x-ray.    Romelle Starcher MS, RDN, LDN, CNSC Registered Dietitian 3 Clinical Nutrition RD Pager and On-Call Pager Number Located in [place]

## 2021-09-16 NOTE — Progress Notes (Signed)
PT Cancellation Note  Patient Details Name: Troy Randall MRN: [id number] DOB: 01-02-1961   Cancelled Treatment:    Reason Eval/Treat Not Completed: Active bedrest order Will await increase in activity orders prior to PT evaluation. Will follow.   Blake Divine A Berma Harts 09/16/2021, 6:55 AM [place], PT, DPT Acute Rehabilitation Services Secure chat preferred Office [phone]

## 2021-09-16 NOTE — Progress Notes (Signed)
NAME:  Troy Randall, MRN:  [id number], DOB:  12-09-1960, LOS: 12 ADMISSION DATE:  09/04/2021, CONSULTATION DATE:  09/06/21 REFERRING MD:  [name], CHIEF COMPLAINT:  melena   History of Present Illness:   Troy Randall is an 61 y.o. male with prior history of HTN, HLD, DM, and CVA on ASA/ plavix who presented to [facility] on 8/31 after developing frequent 6-7 dark stools and feeling dizzy and lightheaded on standing.  No hx of NSAIDs.  FOBT positive and initial Hgb 12.2.    Patient admitted to [facility] with GI consulting.  Remained orthostatic with progressive decline in Hgb.  CTA abdomen did not show any active bleed, therefore underwent colonoscopy > no active bleed found but large clots throughout entire colon.  Then underwent EGD which showed a small hiatal hernia, erosive gastropathy with no stigmata of recent bleeding, and non-bleeding duodenal diverticulum.   Underwent then small bowel enteroscopy which noted intermittent blood pumping in the proximal jejunum but was unable to be reached despite multiple attempts, was injected and clip placed.  Repeat CTA repeated, which showed positive jejunal diverticular bleeding. Patient was transferred to [facility] for IR involvement. In IR active extravasation  at [facility] territory, jejunal arcade branch, successful coil embolization.  PCCM was consulted for transfer and admission.  Pertinent  Medical History  HTN, HLD, DM, CVA on ASA/ plavix  Significant Hospital Events: Including procedures, antibiotic start and stop dates in addition to other pertinent events   8/31 presented to [facility], [name] Admit, Gi Consult 9/1 CTA> no evidence of active GI bleed, 2 U PRBC 9/2 2 U PRBC, colonoscopy > no active bleed found but large clots throughout entire colon. EGD> small hiatal hernia, erosive gastropathy with no stigmata of recent bleeding, and non-bleeding duodenal diverticulum.   Underwent then small bowel enteroscopy which noted intermittent blood pumping in the proximal jejunum  but was unable to be reached despite multiple attempts, was injected and clip placed. Repeat CTA repeated, which showed positive jejunal diverticular bleeding. IR> active extrav at [facility] territory, jejunal arcade branch, successful coil embo 9/3 Intubated. CTA> no evidence of active GI bleeding.  New finding of abnormal gallbladder with nondependent air, gallbladder wall air, pericholecystic and right upper quadrant information.  IR placed percutaneous cholecystotomy tube.  9/4 R. IJ central line placed with dialysis triple lumen catheter 9/5 CRRT initiated 9/6 Bedside EGD without any evidence of bleeding. CTA a/p without etiology for bleed 9/8 CRRT discontinued. Required ETT exchange yesterday due to blown cuff. 9/11 Bronchoscopic guided percutaneous tracheostomy placement.  Right IJ central line removed. Interim History / Subjective:  Perc tracheostomy placed yesterday. Overnight patient with increased agitation requiring soft restraints and fentanyl PRN, per nursing staff moving both extremities.   Objective   Blood pressure (!) 140/62, pulse 91, temperature 98.3 F (36.8 C), temperature source Oral, resp. rate 18, height 6' (1.829 m), weight (!) 138.6 kg, SpO2 95 %. Vent Mode: PRVC FiO2 (%):  [30 %-40 %] 40 % Set Rate:  [18 bmp] 18 bmp Vt Set:  [620 mL] 620 mL PEEP:  [5 cmH20] 5 cmH20 Pressure Support:  [8 cmH20] 8 cmH20 Plateau Pressure:  [15 cmH20-20 cmH20] 17 cmH20   Intake/Output Summary (Last 24 hours) at 09/16/2021 0551 Last data filed at 09/16/2021 0200 Gross per 24 hour  Intake 790.65 ml  Output 4480 ml  Net -3689.35 ml    Filed Weights   09/12/21 0500 09/13/21 0500 09/15/21 0500  Weight: (!) 154.7 kg (!) 146.1  kg (!) 138.6 kg   Physical Exam: General: Chronically ill-appearing, no acute distress HENT: , AT, ETT in place Eyes: EOMI, no scleral icterus.  Respiratory: Clear to auscultation bilaterally.  No crackles, wheezing or rales Cardiovascular: RRR, -M/R/G, no  JVD GI: BS+, soft, non-tender. Percutaneous chole drain in place.  Extremities: 1+ pitting edema, Neuro: Eyes open, not tracking. Not following commands GU: Foley in place  Resolved Hospital Problem list   AKI - off CRRT Septic shock Assessment & Plan:   Acute encephalopathy secondary to sepsis History of CVA without residual deficits CT head 9/8 with small vessel infarcts vs inflammatory process. Patient favoring right side when he does have purposeful movements. Fentanyl PRN for agitation, but concern for acute infarction. Endoscopy capsule remains in rectum, per pelvic xray this am. Will give enema this am at hopes of passing. Aspirin restarted yesterday.  -Monitor mental status and avoid sedating medications if possible -Continue aspirin -Enema today  Acute hypoxemic respiratory failure s/p percutaneous tracheostomy 9/11 Bronchoscopy guided percutaneous tracheostomy placement performed yesterday without any complications.  We will continue weaning trials, patient seems to be more awake over the last 24 hours. -Tolerating PS -SBT/WUA daily. -VAP protocol -Minimize sedation  Hemorrhagic shock - resolved ABLA secondary to lower GI bleed, jejunal diverticular and ulcers secondary to ischemia Acute blood loss and iron deficiency anemia, secondary to above - 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 and bedside EGD 9/6 No further episodes of melena.  Pelvic x-ray with presence of endoscopy capsule.  Iron studies consistent with iron deficiency anemia likely in setting of acute illness and malnutrition. Will await iron repletion with constipation. Total iron deficit of 1162 -Daily CBC -Will need iron repletion -Transfuse PRBC if HBG less than 7  Ecoli Cholecystitis s/p percutaneous cholecystostomy drain placement 9/3 percutaneous cholecystostomy drain placed. 580 cc in drain over last 24 hrs. Resolved leukocytosis and has remained afebrile -Completed total 7d of  antibiotics: Ceftriaxone  -Continue to monitor drain -Trend fever/WBC -Will need outpatient surgery and IR follow up  AKI  - off CRRT 9/8 Bilateral nonobstructing nephrolithiasis - suspect chronic BPH Cr stable and continues to be hypervolemic. Will continue with 40 lasix BID -Nephrology no longer following -Diurese with lasix 40 BID again today -Trend electrolytes, replete as needed -Hold flomax -Foley catheter in place, strict i/o  Thrombocytopenia - normalized Low suspicion for HIT.  -Trend with daily CBC -on heparin ggt  DM2 SSI q4h, novolog 3U q4h, and semglee 20 u daily. At goal.  -Blood Glucose goal 140-180 -Adjust as needed to meet goal above  HTN HLD -Continue amlodipine 5 mg daily. With persistent hypertension despite amlodipine, will add back lisinopril today.  -continue amlodipine 5 mg and restart lisinopril -Hold statin  Pulmonary nodules in bilateral bases, L 50mm Family reports patient is a never smoker. Family does endorse dipping. -no further follow up needed for a low risk patient based on fleischner criteria  Best Practice (right click and "Reselect all SmartList Selections" daily)   Diet/type: TF DVT prophylaxis: prophylactic heparin  GI prophylaxis: PPI Lines: Foley, PICC RUE Foley:  yes Code Status:  full code Last date of multidisciplinary goals of care discussion [Full scope- Discussed with wife Troy Randall 9/2 . Family updated at bedside 9/12  Troy Bloodgood DO  Internal Medicine Resident PGY-3 [place]  Pager: [phone]

## 2021-09-16 NOTE — Progress Notes (Signed)
Pt placed on PS/CPAP 10/5 on 40% and is tolerating well. RT will monitor. 

## 2021-09-16 NOTE — Evaluation (Signed)
Physical Therapy Evaluation Patient Details Name: Troy Randall MRN: [id number] DOB: Jan 26, 1960 Today's Date: 09/16/2021  History of Present Illness  Patient is a 61 y/o male who presents on 09/04/21 with dark stools, dizziness and SOB. Found to have GI bleed and acute anemia s/p embolization 09/06/21 and percutaneous cholecystostomy 09/07/21. Intubated 9/3, CRRT 9/5-9/8. Hemorrhagic and septic shock 9/5. Trach placed 9/11. PMH includes CVA, DM, HTN.  Clinical Impression  Patient presents with lethargy, generalized weakness (Left side>right side), impaired cognition and impaired mobility s/p above. Pt lethargic during session impacting evaluation. Follows a few 1 step commands on right side of body but no active movement observed on left side today. Favors right gaze during session, able to get head to look left but not gaze. Observed to spontaneously try to shift trunk in the bed and elevate hips. Pt independent with ADLs and ambulation PTA. Pt is on disability due to chronic back pain and has a supportive wife. Will plan to reassess when pt more alert and able to participate to get a better understanding of deficits/cognition. Would benefit from post acute rehab to maximize independence and mobility as well as ease burden of care prior to return home. Will follow acutely.     Recommendations for follow up therapy are one component of a multi-disciplinary discharge planning process, led by the attending physician.  Recommendations may be updated based on patient status, additional functional criteria and insurance authorization.  Follow Up Recommendations PT at Long-term acute care hospital (pending improvement)      Assistance Recommended at Discharge Frequent or constant Supervision/Assistance  Patient can return home with the following  Two people to help with walking and/or transfers;Two people to help with bathing/dressing/bathroom;Help with stairs or ramp for entrance;Assist for  transportation;Direct supervision/assist for medications management;Assistance with cooking/housework;Assistance with feeding    Equipment Recommendations Other (comment) (defer to next venue)  Recommendations for Other Services       Functional Status Assessment Patient has had a recent decline in their functional status and demonstrates the ability to make significant improvements in function in a reasonable and predictable amount of time.     Precautions / Restrictions Precautions Precautions: Fall;Other (comment) Precaution Comments: trach via vent, mitts Restrictions Weight Bearing Restrictions: No      Mobility  Bed Mobility               General bed mobility comments: Able to attempt to extend back and push hips into air minimally but not to command.    Transfers                        Ambulation/Gait                  Stairs            Wheelchair Mobility    Modified Rankin (Stroke Patients Only)       Balance                                             Pertinent Vitals/Pain Pain Assessment Pain Assessment: Faces Faces Pain Scale: No hurt    Home Living Family/patient expects to be discharged to:: Private residence Living Arrangements: Spouse/significant other Available Help at Discharge: Family;Available PRN/intermittently Type of Home: House Home Access: Stairs to enter Entrance Stairs-Rails: None Entrance Stairs-Number of Steps: 1 Alternate  Level Stairs-Number of Steps: chair lift down to basement Home Layout: Two level;Laundry or work area in [employer]: Wheelchair - manual      Prior Function Prior Level of Function : Independent/Modified Independent             Mobility Comments: independent ambulation; pt drives ADLs Comments: independent     Hand Dominance   Dominant Hand: Right    Extremity/Trunk Assessment   Upper Extremity Assessment Upper Extremity Assessment:  Defer to OT evaluation;RUE deficits/detail;LUE deficits/detail RUE Deficits / Details: Able to squeeze hand but no other arm movements noted to command. LUE Deficits / Details: No active movement noted in LUE.    Lower Extremity Assessment Lower Extremity Assessment: RLE deficits/detail;Difficult to assess due to impaired cognition;LLE deficits/detail RLE Deficits / Details: Able to wiggle toes, no other active movement noted to command, spontaneous movement noted extending in bed LLE Deficits / Details: Not able to wiggle toes, no active movement observed during eval    Cervical / Trunk Assessment Cervical / Trunk Assessment: Other exceptions Cervical / Trunk Exceptions: favors right rotation  Communication   Communication: No difficulties  Cognition Arousal/Alertness: Lethargic Behavior During Therapy: Flat affect Overall Cognitive Status: Difficult to assess                                 General Comments: Pt following a few simple 1 step commands, "squeeze hand" "wiggle toes" Prefers right gaze preference, never able to get pt to gaze towards left. Wife says he has. Lethargic.        General Comments General comments (skin integrity, edema, etc.): Pt on trach via vent 40% Fi02, PEEP 5. Wife present during session and provided PLOF/history.    Exercises General Exercises - Upper Extremity Shoulder Flexion: PROM, Both, 5 reps, Supine Elbow Flexion: PROM, Both, 5 reps, Supine Elbow Extension: PROM, Both, 5 reps, Supine Wrist Flexion: PROM, Both, 5 reps, Supine Wrist Extension: PROM, Both, 5 reps, Supine Digit Composite Flexion: AAROM, PROM, Both, 5 reps, Supine Composite Extension: PROM, AAROM, Both, 5 reps, Supine General Exercises - Lower Extremity Ankle Circles/Pumps: PROM, Both, 5 reps, Supine Heel Slides: PROM, Both, 5 reps, Supine Hip ABduction/ADduction: PROM, Both, 5 reps, Supine   Assessment/Plan    PT Assessment Patient needs continued PT services   PT Problem List Decreased strength;Decreased range of motion;Decreased mobility;Obesity;Decreased safety awareness;Decreased activity tolerance;Decreased balance;Decreased cognition;Cardiopulmonary status limiting activity       PT Treatment Interventions Therapeutic activities;Gait training;Patient/family education;Therapeutic exercise;DME instruction;Wheelchair mobility training;Neuromuscular re-education;Balance training    PT Goals (Current goals can be found in the Care Plan section)  Acute Rehab PT Goals Patient Stated Goal: unable to state PT Goal Formulation: Patient unable to participate in goal setting Time For Goal Achievement: 09/30/21 Potential to Achieve Goals: Fair    Frequency Min 2X/week     Co-evaluation               AM-PAC PT "6 Clicks" Mobility  Outcome Measure Help needed turning from your back to your side while in a flat bed without using bedrails?: Total Help needed moving from lying on your back to sitting on the side of a flat bed without using bedrails?: Total Help needed moving to and from a bed to a chair (including a wheelchair)?: Total Help needed standing up from a chair using your arms (e.g., wheelchair or bedside chair)?: Total Help needed to walk in hospital  room?: Total Help needed climbing 3-5 steps with a railing? : Total 6 Click Score: 6    End of Session Equipment Utilized During Treatment: Oxygen Activity Tolerance: Patient limited by lethargy Patient left: in bed;with call bell/phone within reach;with SCD's reapplied;with family/visitor present Nurse Communication: Mobility status;Need for lift equipment PT Visit Diagnosis: Muscle weakness (generalized) (M62.81)    Time: 2297-9892 PT Time Calculation (min) (ACUTE ONLY): 13 min   Charges:   PT Evaluation $PT Eval High Complexity: 1 High          Vale Haven, PT, DPT Acute Rehabilitation Services Secure chat preferred Office [phone]     Blake Divine A  Lanier Ensign 09/16/2021, 2:34 PM

## 2021-09-17 ENCOUNTER — Inpatient Hospital Stay (HOSPITAL_COMMUNITY): Payer: Medicare HMO

## 2021-09-17 DIAGNOSIS — K625 Hemorrhage of anus and rectum: Secondary | ICD-10-CM | POA: Diagnosis not present

## 2021-09-17 DIAGNOSIS — Z9889 Other specified postprocedural states: Secondary | ICD-10-CM | POA: Diagnosis not present

## 2021-09-17 DIAGNOSIS — K922 Gastrointestinal hemorrhage, unspecified: Secondary | ICD-10-CM | POA: Diagnosis not present

## 2021-09-17 DIAGNOSIS — K921 Melena: Secondary | ICD-10-CM | POA: Diagnosis not present

## 2021-09-17 DIAGNOSIS — K449 Diaphragmatic hernia without obstruction or gangrene: Secondary | ICD-10-CM | POA: Diagnosis not present

## 2021-09-17 DIAGNOSIS — K5710 Diverticulosis of small intestine without perforation or abscess without bleeding: Secondary | ICD-10-CM | POA: Diagnosis not present

## 2021-09-17 DIAGNOSIS — K3189 Other diseases of stomach and duodenum: Secondary | ICD-10-CM | POA: Diagnosis not present

## 2021-09-17 LAB — CBC WITH DIFFERENTIAL/PLATELET
Abs Immature Granulocytes: 0.05 10*3/uL (ref 0.00–0.07)
Basophils Absolute: 0 10*3/uL (ref 0.0–0.1)
Basophils Relative: 0 %
Eosinophils Absolute: 0.3 10*3/uL (ref 0.0–0.5)
Eosinophils Relative: 3 %
HCT: 32.2 % — ABNORMAL LOW (ref 39.0–52.0)
Hemoglobin: 10.1 g/dL — ABNORMAL LOW (ref 13.0–17.0)
Immature Granulocytes: 1 %
Lymphocytes Relative: 8 %
Lymphs Abs: 0.8 10*3/uL (ref 0.7–4.0)
MCH: 28.8 pg (ref 26.0–34.0)
MCHC: 31.4 g/dL (ref 30.0–36.0)
MCV: 91.7 fL (ref 80.0–100.0)
Monocytes Absolute: 0.6 10*3/uL (ref 0.1–1.0)
Monocytes Relative: 6 %
Neutro Abs: 8.2 10*3/uL — ABNORMAL HIGH (ref 1.7–7.7)
Neutrophils Relative %: 82 %
Platelets: 360 10*3/uL (ref 150–400)
RBC: 3.51 MIL/uL — ABNORMAL LOW (ref 4.22–5.81)
RDW: 15.8 % — ABNORMAL HIGH (ref 11.5–15.5)
WBC: 10 10*3/uL (ref 4.0–10.5)
nRBC: 0 % (ref 0.0–0.2)

## 2021-09-17 LAB — MAGNESIUM: Magnesium: 2.1 mg/dL (ref 1.7–2.4)

## 2021-09-17 LAB — BASIC METABOLIC PANEL
Anion gap: 7 (ref 5–15)
BUN: 19 mg/dL (ref 6–20)
CO2: 27 mmol/L (ref 22–32)
Calcium: 8.1 mg/dL — ABNORMAL LOW (ref 8.9–10.3)
Chloride: 110 mmol/L (ref 98–111)
Creatinine, Ser: 0.77 mg/dL (ref 0.61–1.24)
GFR, Estimated: >60 mL/min (ref >60)
Glucose, Bld: 141 mg/dL — ABNORMAL HIGH (ref 70–99)
Potassium: 3.6 mmol/L (ref 3.5–5.1)
Sodium: 144 mmol/L (ref 135–145)

## 2021-09-17 LAB — GLUCOSE, CAPILLARY
Glucose-Capillary: 135 mg/dL — ABNORMAL HIGH (ref 70–99)
Glucose-Capillary: 141 mg/dL — ABNORMAL HIGH (ref 70–99)
Glucose-Capillary: 143 mg/dL — ABNORMAL HIGH (ref 70–99)
Glucose-Capillary: 153 mg/dL — ABNORMAL HIGH (ref 70–99)
Glucose-Capillary: 155 mg/dL — ABNORMAL HIGH (ref 70–99)
Glucose-Capillary: 159 mg/dL — ABNORMAL HIGH (ref 70–99)

## 2021-09-17 LAB — PHOSPHORUS: Phosphorus: 3.8 mg/dL (ref 2.5–4.6)

## 2021-09-17 MED ORDER — POTASSIUM CHLORIDE 20 MEQ PO PACK
40.0000 meq | PACK | Freq: Once | ORAL | Status: AC
  Administered 2021-09-17: 40 meq
  Filled 2021-09-17: qty 2

## 2021-09-17 MED ORDER — FUROSEMIDE 10 MG/ML IJ SOLN: 40.0000 mg | Freq: Two times a day (BID) | INTRAMUSCULAR | Status: DC

## 2021-09-17 MED ORDER — FUROSEMIDE 40 MG PO TABS
40.0000 mg | ORAL_TABLET | Freq: Every day | ORAL | Status: DC
  Administered 2021-09-17 – 2021-09-22 (×6): 40 mg
  Filled 2021-09-17 (×6): qty 1

## 2021-09-17 MED ORDER — ZINC OXIDE 12.8 % EX OINT
TOPICAL_OINTMENT | Freq: Two times a day (BID) | CUTANEOUS | Status: DC
  Administered 2021-09-20 – 2021-10-16 (×6): 1 via TOPICAL
  Filled 2021-09-17 (×8): qty 56.7

## 2021-09-17 NOTE — Evaluation (Signed)
Occupational Therapy Evaluation Patient Details Name: Troy Randall MRN: [id number] DOB: 03-11-1960 Today's Date: 09/17/2021   History of Present Illness Patient is a 61 y/o male who presents on 09/04/21 with dark stools, dizziness and SOB. Found to have GI bleed and acute anemia s/p embolization 09/06/21 and percutaneous cholecystostomy 09/07/21. Intubated 9/3, CRRT 9/5-9/8. Hemorrhagic and septic shock 9/5. Trach placed 9/11. PMH includes CVA, DM, HTN.   Clinical Impression   Pt was independent prior to admission. Presents was alert with eyes open during session and following simple commands at times. Pt is profoundly weak, greater on L side vs R. His eyes appear dysconjugate and he has a R gaze preference. Pt requires +2 total assist for bed level mobility and demonstrates poor sitting balance. He is dependent in all ADLs. VSS were stable weaning from vent via trach. He will need extensive rehab when medically stable, recommending LTACH.     Recommendations for follow up therapy are one component of a multi-disciplinary discharge planning process, led by the attending physician.  Recommendations may be updated based on patient status, additional functional criteria and insurance authorization.   Follow Up Recommendations  OT at Long-term acute care hospital    Assistance Recommended at Discharge Frequent or constant Supervision/Assistance  Patient can return home with the following Two people to help with walking and/or transfers;Two people to help with bathing/dressing/bathroom;Direct supervision/assist for medications management;Direct supervision/assist for financial management;Assist for transportation;Help with stairs or ramp for entrance;Assistance with feeding;Assistance with cooking/housework    Functional Status Assessment  Patient has had a recent decline in their functional status and/or demonstrates limited ability to make significant improvements in function in a reasonable and  predictable amount of time  Equipment Recommendations  Other (comment) (defer to next venue)    Recommendations for Other Services       Precautions / Restrictions Precautions Precautions: Fall Precaution Comments: vent via trach, mitts Restrictions Weight Bearing Restrictions: No      Mobility Bed Mobility Overal bed mobility: Needs Assistance Bed Mobility: Rolling, Sidelying to Sit, Sit to Sidelying Rolling: Total assist, +2 for physical assistance Sidelying to sit: Total assist, +2 for physical assistance     Sit to sidelying: Total assist, +2 for physical assistance      Transfers                          Balance Overall balance assessment: Needs assistance Sitting-balance support: Single extremity supported, Bilateral upper extremity supported, Feet supported Sitting balance-Leahy Scale: Poor Sitting balance - Comments: Worked at EOB for 12-15 min on w/shifts L to R, balance propped on each elbow, in midline, worked on full cervical spinal extension/head control.  Used sheet to assist pelvic tilt toward neutral and 2nd person to facilitate extension and scapular retraction and cervical extension to neutral.  Pt unable to hold midline without mod/max assist.  pt able to activate abdominals to arrest falling backward slowly, but only minimal activation to extend from forward sitting.                                   ADL either performed or assessed with clinical judgement   ADL  General ADL Comments: total assist     Vision   Additional Comments: dysconjugate and R gaze preference, but able to turn eyes slightly past midline inconsistently     Perception     Praxis      Pertinent Vitals/Pain Pain Assessment Pain Assessment: Faces Faces Pain Scale: No hurt     Hand Dominance Right   Extremity/Trunk Assessment Upper Extremity Assessment Upper Extremity Assessment:  Generalized weakness;Difficult to assess due to impaired cognition (L weaker than R) RUE Deficits / Details: reaches to belly and squeezes hand LUE Deficits / Details: elbow flexion observed inconsistently an not on command   Lower Extremity Assessment Lower Extremity Assessment: Defer to PT evaluation   Cervical / Trunk Assessment Cervical / Trunk Assessment: Other exceptions Cervical / Trunk Exceptions: weakness in trunk, R cervical rotation, but able to turn head to midline   Communication Communication Communication: No difficulties   Cognition Arousal/Alertness: Lethargic, Awake/alert Behavior During Therapy: Flat affect Overall Cognitive Status: Difficult to assess                                 General Comments: Followed a few 1-step commands, more as he was more aroused after sitting up.     General Comments  VSS on vent wean.  Pt turned had L x1 and eyes past midline to the left to cuing x1    Exercises     Shoulder Instructions      Home Living Family/patient expects to be discharged to:: Private residence Living Arrangements: Spouse/significant other Available Help at Discharge: Family;Available PRN/intermittently Type of Home: House Home Access: Stairs to enter [employer] of Steps: 1 Entrance Stairs-Rails: None Home Layout: Two level;Laundry or work area in basement Alternate Teacher, music of Steps: chair lift down to basement Alternate Level Stairs-Rails: Left Bathroom Shower/Tub: Walk-in shower;Tub/shower unit   Bathroom Toilet: Handicapped height     Home Equipment: Wheelchair - manual          Prior Functioning/Environment Prior Level of Function : Independent/Modified Independent             Mobility Comments: independent ambulation; pt drives ADLs Comments: independent        OT Problem List: Decreased strength;Decreased activity tolerance;Impaired balance (sitting and/or standing);Impaired  vision/perception;Decreased cognition;Decreased coordination;Obesity;Impaired UE functional use;Cardiopulmonary status limiting activity      OT Treatment/Interventions: Self-care/ADL training;Therapeutic exercise;Therapeutic activities;Cognitive remediation/compensation;Patient/family education;Balance training    OT Goals(Current goals can be found in the care plan section) Acute Rehab OT Goals OT Goal Formulation: With family Time For Goal Achievement: 10/01/21 Potential to Achieve Goals: Fair ADL Goals Pt/caregiver will Perform Home Exercise Program: Increased strength;Both right and left upper extremity;With minimal assist (A/AROM) Additional ADL Goal #1: Pt will follow one step commands 50% of time. Additional ADL Goal #2: Pt will demonstrate fair sitting balance at EOB x 5 minutes as a precursor to ADLs. Additional ADL Goal #3: Pt will roll with moderate assistance for pressure relief and pericare.  OT Frequency: Min 2X/week    Co-evaluation PT/OT/SLP Co-Evaluation/Treatment: Yes Reason for Co-Treatment: Complexity of the patient's impairments (multi-system involvement)          AM-PAC OT "6 Clicks" Daily Activity     Outcome Measure Help from another person eating meals?: Total Help from another person taking care of personal grooming?: Total Help from another person toileting, which includes using toliet, bedpan, or urinal?: Total Help from another person bathing (  including washing, rinsing, drying)?: Total Help from another person to put on and taking off regular upper body clothing?: Total Help from another person to put on and taking off regular lower body clothing?: Total 6 Click Score: 6   End of Session Nurse Communication: Mobility status  Activity Tolerance: Patient tolerated treatment well Patient left: in bed;with call bell/phone within reach;with bed alarm set;with family/visitor present  OT Visit Diagnosis: Muscle weakness (generalized) (M62.81);Other  symptoms and signs involving cognitive function;Low vision, both eyes (H54.2)                Time: 1359-1435 OT Time Calculation (min): 36 min Charges:  OT General Charges $OT Visit: 1 Visit OT Evaluation $OT Eval High Complexity: 1 High  Berna Spare, OTR/L Acute Rehabilitation Services Office: [phone]   Evern Bio 09/17/2021, 3:50 PM

## 2021-09-17 NOTE — Consult Note (Signed)
WOC nurse in to see patient based on possible HAPI, however assessment reveals MASD (moisture associated skin damage), near the anus, loose stools and moisture have made the skin weak and superficial peeling is noted 2cm proximal from the anus.  Will add triple paste for this area due to loose stools.  Discussed POC with patient and bedside nurse.  Re consult if needed, will not follow at this time. Thanks  Maebelle Sulton [employer], RN,CWOCN, CNS, CWON-AP [phone])

## 2021-09-17 NOTE — Progress Notes (Addendum)
NAME:  Troy Randall, MRN:  [id number], DOB:  [birth year], LOS: 13 ADMISSION DATE:  09/04/2021, CONSULTATION DATE:  09/06/21 REFERRING MD:  [name], CHIEF COMPLAINT:  melena   History of Present Illness:   Troy Randall is an 61 y.o. male with prior history of HTN, HLD, DM, and CVA on ASA/ plavix who presented to [facility] on 8/31 after developing frequent 6-7 dark stools and feeling dizzy and lightheaded on standing.  No hx of NSAIDs.  FOBT positive and initial Hgb 12.2.    Patient admitted to [facility] with GI consulting.  Remained orthostatic with progressive decline in Hgb.  CTA abdomen did not show any active bleed, therefore underwent colonoscopy > no active bleed found but large clots throughout entire colon.  Then underwent EGD which showed a small hiatal hernia, erosive gastropathy with no stigmata of recent bleeding, and non-bleeding duodenal diverticulum.   Underwent then small bowel enteroscopy which noted intermittent blood pumping in the proximal jejunum but was unable to be reached despite multiple attempts, was injected and clip placed.  Repeat CTA repeated, which showed positive jejunal diverticular bleeding. Patient was transferred to [facility] for IR involvement. In IR active extravasation  at [facility] territory, jejunal arcade branch, successful coil embolization.  PCCM was consulted for transfer and admission.  Pertinent  Medical History  HTN, HLD, DM, CVA on ASA/ plavix  Significant Hospital Events: Including procedures, antibiotic start and stop dates in addition to other pertinent events   8/31 presented to [facility], [name] Admit, Gi Consult 9/1 CTA> no evidence of active GI bleed, 2 U PRBC 9/2 2 U PRBC, colonoscopy > no active bleed found but large clots throughout entire colon. EGD> small hiatal hernia, erosive gastropathy with no stigmata of recent bleeding, and non-bleeding duodenal diverticulum.   Underwent then small bowel enteroscopy which noted intermittent blood pumping in the proximal jejunum  but was unable to be reached despite multiple attempts, was injected and clip placed.  Endoscopic capsule placed.  Repeat CTA repeated, which showed positive jejunal diverticular bleeding. IR> active extrav at [facility] territory, jejunal arcade branch, successful coil embo 9/3 Intubated. CTA> no evidence of active GI bleeding.  New finding of abnormal gallbladder with nondependent air, gallbladder wall air, pericholecystic and right upper quadrant information.  IR placed percutaneous cholecystotomy tube.  9/4 R. IJ central line placed with dialysis triple lumen catheter 9/5 CRRT initiated 9/6 Bedside EGD without any evidence of bleeding. CTA a/p without etiology for bleed 9/8 CRRT discontinued. Required ETT exchange yesterday due to blown cuff. 9/11 Bronchoscopic guided percutaneous tracheostomy placement.  Right IJ central line removed. 912: Endoscopic capsule still present.  Unable to proceed with MRI to evaluate for stroke 9/13: Endoscopic capsule and rectum but still present.  Working on Social research officer, government.  Initiating diuresis. Interim History / Subjective:  Appears comfortable Objective   Blood pressure (Abnormal) 149/123, pulse 92, temperature 98.6 F (37 C), temperature source Oral, resp. rate 16, height 6' (1.829 m), weight 134.8 kg, SpO2 97 %. Vent Mode: PSV;CPAP FiO2 (%):  [40 %] 40 % Vt Set:  [952 mL] 952 mL PEEP:  [5 cmH20] 5 cmH20 Pressure Support:  [10 cmH20] 10 cmH20 Plateau Pressure:  [15 cmH20-18 cmH20] 15 cmH20   Intake/Output Summary (Last 24 hours) at 09/17/2021 1824 Last data filed at 09/17/2021 1600 Gross per 24 hour  Intake 250 ml  Output 2850 ml  Net -2600 ml   Filed Weights   09/13/21 0500 09/15/21 0500 09/16/21 0500  Weight: (Abnormal)  146.1 kg (Abnormal) 138.6 kg 134.8 kg   Physical Exam: General 61 year old male patient currently ventilator support no acute distress HEENT normocephalic atraumatic pupils equal reactive size 6 trach midline site clean Pulmonary clear to  auscultation diminished bases, on weaning attempt earlier was apneic Cardiac: Regular rate and rhythm Abdomen soft not tender, percutaneous drain with bilious output Extremities warm dry brisk cap refill Neuro opens eyes, follows commands more briskly on the right, focal deficits appreciated. GU clear yellow Resolved Hospital Problem list   AKI - off CRRT, blood chemistry normalized Septic shock Hemorrhagic shock - resolved Thrombocytopenia Assessment & Plan:   Acute encephalopathy secondary to sepsis and polypharmacy History of CVA without residual deficits: CT head 9/8 with small vessel infarcts vs inflammatory process.  Patient beginning to have bilateral upper extremity and lower extremity movements does not appear to be favoring one side over the past 24 hours.  Once he passes endoscopy capsule we will plan for MRI.  Is continued on heparin and aspirin.  Continued encephalopathy likely secondary to sensitivity to sedatives.  Plan Minimize sedation RASS goal -1 MRI after he passes endoscopic capsule  Acute hypoxemic respiratory failure s/p percutaneous tracheostomy 9/11 Bronchoscopy guided percutaneous tracheostomy placement performed yesterday without any complications.  We will continue weaning trials, patient seems to be more awake over the last 24 hours. Plan Daily aerosol trach collar/pressure support trial VAP bundle PAD protocol As needed chest x-ray  ABLA secondary to lower GI bleed, jejunal diverticular and ulcers secondary to ischemia Acute blood loss and iron deficiency anemia, secondary to above - 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 and bedside EGD 9/6 Hemoglobin stable Plan A.m. CBC  Ecoli Cholecystitis s/p percutaneous cholecystostomy drain placement 9/3 percutaneous cholecystostomy drain placed. -250cc in drain over last 24 hrs. Plan Off antibiotics, drain management per interventional radiology  ST elevation V1,2 and 3 (acute change on  9/14) Plan Cycle CEs Cards eval  If having acute ischemia I think we need to delay surg. Will also speak to N Surg. IV heparin and ASA need their opinion on that IF cards thinks this is indeed ischemia   Bilateral nonobstructing nephrolithiasis - suspect chronic BPH Plan Avoid hypotension Intermittent chemistries  DM2 Plan Sliding scale insulin Continue Semglee 20 units daily Goal glucose 140-180  HTN & HLD Plan Continue Norvasc furosemide as BUN/creatinine tolerate Holding Crestor  Pulmonary nodules in bilateral bases, L 39mm Family reports patient is a never smoker. Family does endorse dipping. Plan -no further follow up needed for a low risk patient based on fleischner criteria  Best Practice (right click and "Reselect all SmartList Selections" daily)   Diet/type: TF DVT prophylaxis: prophylactic heparin  GI prophylaxis: PPI Lines: Foley, PICC RUE Foley:  yes Code Status:  full code Last date of multidisciplinary goals of care discussion wife updated at bedside, I think he would be a excellent LTAC candidate once he gets his MRI completed Critical care time 32 minutes  Simonne Martinet ACNP-BC [facility] Pulmonary/Critical Care Pager # [phone] OR # [phone] if no answer

## 2021-09-17 NOTE — Progress Notes (Signed)
Physical Therapy Treatment Patient Details Name: Troy Randall MRN: [id number] DOB: [birth year] Today's Date: 09/17/2021   History of Present Illness Patient is a 61 y/o male who presents on 09/04/21 with dark stools, dizziness and SOB. Found to have GI bleed and acute anemia s/p embolization 09/06/21 and percutaneous cholecystostomy 09/07/21. Intubated 9/3, CRRT 9/5-9/8. Hemorrhagic and septic shock 9/5. Trach placed 9/11. PMH includes CVA, DM, HTN.    PT Comments    Pt slowly progressing toward goals.  Emphasis today on transitions to sitting, sitting balance at EOB working on activation, w/shifting, pelvic neutral/ scapular retraction, rolling, cervical ROM and tracking past midline.    Recommendations for follow up therapy are one component of a multi-disciplinary discharge planning process, led by the attending physician.  Recommendations may be updated based on patient status, additional functional criteria and insurance authorization.  Follow Up Recommendations  PT at Long-term acute care hospital     Assistance Recommended at Discharge Frequent or constant Supervision/Assistance  Patient can return home with the following Two people to help with walking and/or transfers;Two people to help with bathing/dressing/bathroom;Assistance with cooking/housework;Assist for transportation;Help with stairs or ramp for entrance   Equipment Recommendations  Other (comment) (TBD)    Recommendations for Other Services       Precautions / Restrictions Precautions Precautions: Fall Restrictions Weight Bearing Restrictions: No     Mobility  Bed Mobility Overal bed mobility: Needs Assistance Bed Mobility: Rolling, Sidelying to Sit, Sit to Sidelying Rolling: Total assist, +2 for physical assistance Sidelying to sit: Total assist, +2 for physical assistance     Sit to sidelying: Total assist, +2 for physical assistance      Transfers                   General transfer comment:  NT today    Ambulation/Gait               General Gait Details: Not able yet   Stairs             Wheelchair Mobility    Modified Rankin (Stroke Patients Only)       Balance Overall balance assessment: Needs assistance Sitting-balance support: Single extremity supported, Bilateral upper extremity supported, Feet supported Sitting balance-Leahy Scale: Poor Sitting balance - Comments: Worked at EOB for 12-15 min on w/shifts L to R, balance propped on each elbow, in midline, worked on full cervical spinal extension/head control.  Used sheet to assist pelvic tilt toward neutral and 2nd person to facilitate extension and scapular retraction and cervical extension to neutral.  Pt unable to hold midline without mod/max assist.  pt able to activate abdominals to arrest falling backward slowly, but only minimal activation to extend from forward sitting.                                    Cognition Arousal/Alertness: Lethargic, Awake/alert Behavior During Therapy: Flat affect Overall Cognitive Status: Difficult to assess                                 General Comments: Followed a few 1-step commands, more as he was more aroused after sitting up.        Exercises Other Exercises Other Exercises: cervical ROM Other Exercises: Rolling L for w/bearing on L side/positioned biased L side Other Exercises: facilitating scapular retraction/neutral pelvic  tilting.    General Comments General comments (skin integrity, edema, etc.): VSS on vent wean.  Pt turned had L x1 and eyes past midline to the left to cuing x1      Pertinent Vitals/Pain Pain Assessment Pain Assessment: Faces Faces Pain Scale: No hurt Pain Intervention(s): Monitored during session    Home Living                          Prior Function            PT Goals (current goals can now be found in the care plan section) Acute Rehab PT Goals PT Goal Formulation:  Patient unable to participate in goal setting Time For Goal Achievement: 09/30/21 Potential to Achieve Goals: Fair Progress towards PT goals: Progressing toward goals    Frequency    Min 2X/week      PT Plan Current plan remains appropriate    Co-evaluation              AM-PAC PT "6 Clicks" Mobility   Outcome Measure  Help needed turning from your back to your side while in a flat bed without using bedrails?: Total Help needed moving from lying on your back to sitting on the side of a flat bed without using bedrails?: Total Help needed moving to and from a bed to a chair (including a wheelchair)?: Total Help needed standing up from a chair using your arms (e.g., wheelchair or bedside chair)?: Total Help needed to walk in hospital room?: Total Help needed climbing 3-5 steps with a railing? : Total 6 Click Score: 6    End of Session Equipment Utilized During Treatment: Oxygen Activity Tolerance: Patient tolerated treatment well;Patient limited by fatigue Patient left: in bed;with call bell/phone within reach;with SCD's reapplied;with family/visitor present Nurse Communication: Mobility status PT Visit Diagnosis: Muscle weakness (generalized) (M62.81)     Time: 1191-4782 PT Time Calculation (min) (ACUTE ONLY): 36 min  Charges:  $Therapeutic Activity: 8-22 mins                     09/17/2021  Jacinto Halim., PT Acute Rehabilitation Services [phone]  (pager) [phone]  (office)   Eliseo Gum Landrey Mahurin 09/17/2021, 3:06 PM

## 2021-09-17 NOTE — Progress Notes (Signed)
NAME:  Troy Randall, MRN:  [id number], DOB:  January 03, 1961, LOS: 13 ADMISSION DATE:  09/04/2021, CONSULTATION DATE:  09/06/21 REFERRING MD:  [name], CHIEF COMPLAINT:  melena   History of Present Illness:   Troy Randall is an 61 y.o. male with prior history of HTN, HLD, DM, and CVA on ASA/ plavix who presented to [facility] on 8/31 after developing frequent 6-7 dark stools and feeling dizzy and lightheaded on standing.  No hx of NSAIDs.  FOBT positive and initial Hgb 12.2.    Patient admitted to [facility] with GI consulting.  Remained orthostatic with progressive decline in Hgb.  CTA abdomen did not show any active bleed, therefore underwent colonoscopy > no active bleed found but large clots throughout entire colon.  Then underwent EGD which showed a small hiatal hernia, erosive gastropathy with no stigmata of recent bleeding, and non-bleeding duodenal diverticulum.   Underwent then small bowel enteroscopy which noted intermittent blood pumping in the proximal jejunum but was unable to be reached despite multiple attempts, was injected and clip placed.  Repeat CTA repeated, which showed positive jejunal diverticular bleeding. Patient was transferred to [facility] for IR involvement. In IR active extravasation  at [facility] territory, jejunal arcade branch, successful coil embolization.  PCCM was consulted for transfer and admission.  Pertinent  Medical History  HTN, HLD, DM, CVA on ASA/ plavix  Significant Hospital Events: Including procedures, antibiotic start and stop dates in addition to other pertinent events   8/31 presented to [facility], [name] Admit, Gi Consult 9/1 CTA> no evidence of active GI bleed, 2 U PRBC 9/2 2 U PRBC, colonoscopy > no active bleed found but large clots throughout entire colon. EGD> small hiatal hernia, erosive gastropathy with no stigmata of recent bleeding, and non-bleeding duodenal diverticulum.   Underwent then small bowel enteroscopy which noted intermittent blood pumping in the proximal jejunum  but was unable to be reached despite multiple attempts, was injected and clip placed. Repeat CTA repeated, which showed positive jejunal diverticular bleeding. IR> active extrav at [facility] territory, jejunal arcade branch, successful coil embo 9/3 Intubated. CTA> no evidence of active GI bleeding.  New finding of abnormal gallbladder with nondependent air, gallbladder wall air, pericholecystic and right upper quadrant information.  IR placed percutaneous cholecystotomy tube.  9/4 R. IJ central line placed with dialysis triple lumen catheter 9/5 CRRT initiated 9/6 Bedside EGD without any evidence of bleeding. CTA a/p without etiology for bleed 9/8 CRRT discontinued. Required ETT exchange yesterday due to blown cuff. 9/11 Bronchoscopic guided percutaneous tracheostomy placement.  Right IJ central line removed. Interim History / Subjective:  Spontaneous breathing trial yesterday patient did well, per wife is able to last close to an hour.  No acute events overnight.  He is resting in bed comfortably with wife at bedside.  He is having nonpurposeful movements of both his upper extremities  Objective   Blood pressure (!) 156/59, pulse 90, temperature 97.7 F (36.5 C), temperature source Oral, resp. rate 17, height 6' (1.829 m), weight 134.8 kg, SpO2 98 %. Vent Mode: PSV;CPAP FiO2 (%):  [40 %] 40 % Set Rate:  [18 bmp] 18 bmp Vt Set:  [620 mL-952 mL] 952 mL PEEP:  [5 cmH20] 5 cmH20 Pressure Support:  [10 cmH20] 10 cmH20 Plateau Pressure:  [15 cmH20-18 cmH20] 15 cmH20   Intake/Output Summary (Last 24 hours) at 09/17/2021 0609 Last data filed at 09/17/2021 0400 Gross per 24 hour  Intake 100 ml  Output 5150 ml  Net -5050  ml    Filed Weights   09/13/21 0500 09/15/21 0500 09/16/21 0500  Weight: (!) 146.1 kg (!) 138.6 kg 134.8 kg   Physical Exam: General: Chronically ill-appearing, no acute distress HENT: [place], AT, tracheostomy in place Eyes: EOMI, no scleral icterus.  Respiratory: Clear to  auscultation bilaterally.  No crackles, wheezing or rales Cardiovascular: RRR, -M/R/G, no JVD GI: Soft nontender positive bowel sounds.  Percutaneous cholecystostomy drain in place Extremities: Trace edema lower extremity pitting edema, Neuro: Eyes open, no tracking and does not follow commands GU: Foley in place  Resolved Hospital Problem list   AKI - off CRRT Septic shock Assessment & Plan:   Acute encephalopathy secondary to sepsis and polypharmacy History of CVA without residual deficits CT head 9/8 with small vessel infarcts vs inflammatory process.  Patient beginning to have bilateral upper extremity and lower extremity movements does not appear to be favoring one side over the past 24 hours.  Once he passes endoscopy capsule we will plan for MRI.  Is continued on heparin and aspirin.  Continued encephalopathy likely secondary to sensitivity to sedatives. -Decrease fentanyl yesterday to 50-100 mcg, limit use if able -Continue aspirin -Repeat pelvic x-ray, continue enema if no passing of endoscopy capsule  Acute hypoxemic respiratory failure s/p percutaneous tracheostomy 9/11 Bronchoscopy guided percutaneous tracheostomy placement performed yesterday without any complications.  We will continue weaning trials, patient seems to be more awake over the last 24 hours. -Tolerating PS -SBT/WUA daily. -VAP protocol -Minimize sedation  Hemorrhagic shock - resolved ABLA secondary to lower GI bleed, jejunal diverticular and ulcers secondary to ischemia Acute blood loss and iron deficiency anemia, secondary to above - 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 and bedside EGD 9/6 No further episodes of melena.  Hemoglobin stable -Daily CBC -Will need iron repletion -Transfuse PRBC if HBG less than 7  Ecoli Cholecystitis s/p percutaneous cholecystostomy drain placement 9/3 percutaneous cholecystostomy drain placed. -250cc in drain over last 24 hrs. Resolved leukocytosis and  has remained afebrile -Completed total 7d of antibiotics: Ceftriaxone  -Continue to monitor drain -Labs every other day to trend WBC. -Will need outpatient surgery and IR follow up  AKI  - off CRRT 9/8 Bilateral nonobstructing nephrolithiasis - suspect chronic BPH Cr stable and trace lower extremity edema.  -5 L output yesterday. Additional 40 lasix BID and will give 40 mEq potassium supplementation.  Mag stable -Nephrology no longer following -Diurese with lasix 40 BID again today -Trend creatinine and electrolytes every other day -Hold flomax -Foley catheter in place, strict i/o  Thrombocytopenia - normalized Low suspicion for HIT.  -Trend with daily CBC -on heparin ggt  DM2 SSI q4h, novolog 3U q4h, and semglee 20 u daily. At goal.  -Blood Glucose goal 140-180 -Adjust as needed to meet goal above  HTN HLD Amlodipine increased yesterday to home 10 mg dosing.  Was having some hypotensive episodes however blood pressure cuff was on his lower extremity.  When moved to his arm blood pressures improved to maps in the 80s.  If need to add additional antihypertensives can consider beta-blocker, he is having frequent PVCs on telemetry. -Amlodipine 10 mg daily if needed and heart rate able add beta-blocker. -Hold statin  Pulmonary nodules in bilateral bases, L 34mm Family reports patient is a never smoker. Family does endorse dipping. -no further follow up needed for a low risk patient based on fleischner criteria  Best Practice (right click and "Reselect all SmartList Selections" daily)   Diet/type: TF  DVT prophylaxis: prophylactic heparin  GI prophylaxis: PPI Lines: Foley, PICC RUE Foley:  yes Code Status:  full code Last date of multidisciplinary goals of care discussion [Full scope-1 FEN son updated yesterday at bedside.  Thalia Bloodgood DO  Internal Medicine Resident PGY-3   Pager: [phone]

## 2021-09-17 NOTE — TOC Progression Note (Signed)
Transition of Care [facility]) - Initial/Assessment Note    Patient Details  Name: Troy Randall MRN: [id number] Date of Birth: 04/06/1960  [facility] CM/SW Contact:    Ralene Bathe, LCSWA Phone Number: 09/17/2021, 11:55 AM  Clinical Narrative:                 CSW spoke with the patient's spouse to present facility choice for LTACH.  The family choose [facility].  CSW notified [facility]'s liaison to begin the process.  TOC will continue to follow.     Barriers to Discharge: Continued Medical Work up   Patient Goals and CMS Choice Patient states their goals for this hospitalization and ongoing recovery are:: To return home      Expected Discharge Plan and Services     Discharge Planning Services: CM Consult   Living arrangements for the past 2 months: Single Family Home                                      Prior Living Arrangements/Services Living arrangements for the past 2 months: Single Family Home Lives with:: Spouse Patient language and need for interpreter reviewed:: Yes Do you feel safe going back to the place where you live?: Yes      Need for Family Participation in Patient Care: Yes (Comment) Care giver support system in place?: Yes (comment) Current home services: DME (All necessary DME's) Criminal Activity/Legal Involvement Pertinent to Current Situation/Hospitalization: No - Comment as needed  Activities of Daily Living Home Assistive Devices/Equipment: CBG Meter ADL Screening (condition at time of admission) Patient's cognitive ability adequate to safely complete daily activities?: Yes Is the patient deaf or have difficulty hearing?: No Does the patient have difficulty seeing, even when wearing glasses/contacts?: No Does the patient have difficulty concentrating, remembering, or making decisions?: No Patient able to express need for assistance with ADLs?: Yes Does the patient have difficulty dressing or bathing?: No Independently  performs ADLs?: Yes (appropriate for developmental age) Does the patient have difficulty walking or climbing stairs?: No Weakness of Legs: None Weakness of Arms/Hands: None  Permission Sought/Granted Permission sought to share information with : Case Manager, Family Supports Permission granted to share information with : Yes, Verbal Permission Granted              Emotional Assessment Appearance:: Appears stated age Attitude/Demeanor/Rapport: Unable to Assess (Intubated) Affect (typically observed): Unable to Assess (Intubated) Orientation: : Oriented to Self Alcohol / Substance Use: Not Applicable Psych Involvement: No (comment)  Admission diagnosis:  GI bleed [K92.2] Lower GI bleed [K92.2] Patient Active Problem List   Diagnosis Date Noted   AKI (acute kidney injury) (HCC)    Hemorrhagic shock (HCC)    Shock circulatory (HCC)    Acute blood loss anemia    Lower GI bleed 09/04/2021   Acute CVA (cerebrovascular accident) (HCC) 04/28/2021   Cannot sleep 08/26/2015   Chronic right-sided low back pain with sciatica 07/10/2015   Uncontrolled type 2 diabetes mellitus with hyperglycemia, without long-term current use of insulin (HCC) 06/19/2015   Hyperlipidemia 11/29/2013   Essential hypertension 11/28/2013   PCP:  Benita Stabile, MD Pharmacy:   [facility] [address] [place] - [address] [place] [address] Phone: [phone] Fax: [phone]     Social Determinants of Health (SDOH) Interventions    Readmission Risk Interventions  No data to display

## 2021-09-17 NOTE — Progress Notes (Signed)
SLP Cancellation Note  Patient Details Name: JALONI DAVOLI MRN: [id number] DOB: 31-Oct-1960   Cancelled treatment:       Reason Eval/Treat Not Completed: Patient not medically ready. Pt has been weaning and following commands, but is still lethargic and ventilated. Will follow for readiness.    Yoanna Jurczyk, Riley Nearing 09/17/2021, 9:57 AM

## 2021-09-18 ENCOUNTER — Inpatient Hospital Stay (HOSPITAL_COMMUNITY): Payer: Medicare HMO

## 2021-09-18 DIAGNOSIS — A419 Sepsis, unspecified organism: Secondary | ICD-10-CM

## 2021-09-18 DIAGNOSIS — K810 Acute cholecystitis: Secondary | ICD-10-CM

## 2021-09-18 DIAGNOSIS — D500 Iron deficiency anemia secondary to blood loss (chronic): Secondary | ICD-10-CM | POA: Diagnosis present

## 2021-09-18 DIAGNOSIS — M5136 Other intervertebral disc degeneration, lumbar region: Secondary | ICD-10-CM | POA: Diagnosis not present

## 2021-09-18 DIAGNOSIS — G9340 Encephalopathy, unspecified: Secondary | ICD-10-CM

## 2021-09-18 DIAGNOSIS — K922 Gastrointestinal hemorrhage, unspecified: Secondary | ICD-10-CM | POA: Diagnosis not present

## 2021-09-18 DIAGNOSIS — Z8719 Personal history of other diseases of the digestive system: Secondary | ICD-10-CM

## 2021-09-18 DIAGNOSIS — G9341 Metabolic encephalopathy: Secondary | ICD-10-CM | POA: Insufficient documentation

## 2021-09-18 DIAGNOSIS — A498 Other bacterial infections of unspecified site: Secondary | ICD-10-CM

## 2021-09-18 DIAGNOSIS — J9611 Chronic respiratory failure with hypoxia: Secondary | ICD-10-CM

## 2021-09-18 DIAGNOSIS — Z930 Tracheostomy status: Secondary | ICD-10-CM

## 2021-09-18 LAB — GLUCOSE, CAPILLARY
Glucose-Capillary: 142 mg/dL — ABNORMAL HIGH (ref 70–99)
Glucose-Capillary: 133 mg/dL — ABNORMAL HIGH (ref 70–99)
Glucose-Capillary: 164 mg/dL — ABNORMAL HIGH (ref 70–99)
Glucose-Capillary: 174 mg/dL — ABNORMAL HIGH (ref 70–99)
Glucose-Capillary: 171 mg/dL — ABNORMAL HIGH (ref 70–99)
Glucose-Capillary: 177 mg/dL — ABNORMAL HIGH (ref 70–99)
Glucose-Capillary: 214 mg/dL — ABNORMAL HIGH (ref 70–99)

## 2021-09-18 MED ORDER — POTASSIUM CHLORIDE 20 MEQ PO PACK
40.0000 meq | PACK | Freq: Once | ORAL | Status: AC
  Administered 2021-09-18: 40 meq
  Filled 2021-09-18: qty 2

## 2021-09-18 MED ORDER — CLOPIDOGREL BISULFATE 75 MG PO TABS
75.0000 mg | ORAL_TABLET | Freq: Every day | ORAL | Status: DC
  Administered 2021-09-18 – 2021-09-21 (×4): 75 mg
  Filled 2021-09-18 (×4): qty 1

## 2021-09-18 MED ORDER — ROSUVASTATIN CALCIUM 20 MG PO TABS
40.0000 mg | ORAL_TABLET | Freq: Every day | ORAL | Status: DC
  Administered 2021-09-18 – 2021-10-23 (×35): 40 mg
  Filled 2021-09-18 (×36): qty 2

## 2021-09-18 NOTE — Progress Notes (Cosign Needed)
NAME:  Troy Randall, MRN:  [id number], DOB:  April 15, 1960, LOS: 14 ADMISSION DATE:  09/04/2021, CONSULTATION DATE:  09/06/21 REFERRING MD:  [name], CHIEF COMPLAINT:  melena   History of Present Illness:   Troy Randall is an 61 y.o. male with prior history of HTN, HLD, DM, and CVA on ASA/ plavix who presented to [facility] on 8/31 after developing frequent 6-7 dark stools and feeling dizzy and lightheaded on standing.  No hx of NSAIDs.  FOBT positive and initial Hgb 12.2.    Patient admitted to [facility] with GI consulting.  Remained orthostatic with progressive decline in Hgb.  CTA abdomen did not show any active bleed, therefore underwent colonoscopy > no active bleed found but large clots throughout entire colon.  Then underwent EGD which showed a small hiatal hernia, erosive gastropathy with no stigmata of recent bleeding, and non-bleeding duodenal diverticulum.   Underwent then small bowel enteroscopy which noted intermittent blood pumping in the proximal jejunum but was unable to be reached despite multiple attempts, was injected and clip placed.  Repeat CTA repeated, which showed positive jejunal diverticular bleeding. Patient was transferred to [facility] for IR involvement. In IR active extravasation  at SMA territory, jejunal arcade branch, successful coil embolization.  PCCM was consulted for transfer and admission.  Pertinent  Medical History  HTN, HLD, DM, CVA on ASA/ plavix  Significant Hospital Events: Including procedures, antibiotic start and stop dates in addition to other pertinent events   8/31 presented to [facility] ED, [name] Admit, GI Consult 9/1 CTA> no evidence of active GI bleed, 2 U PRBC 9/2 2 U PRBC, colonoscopy > no active bleed found but large clots throughout entire colon. EGD> small hiatal hernia, erosive gastropathy with no stigmata of recent bleeding, and non-bleeding duodenal diverticulum.   Underwent then small bowel enteroscopy which noted intermittent blood pumping in the proximal jejunum  but was unable to be reached despite multiple attempts, was injected and clip placed.  Endoscopic capsule placed.  Repeat CTA repeated, which showed positive jejunal diverticular bleeding. IR> active extrav at [facility] territory, jejunal arcade branch, successful coil embo 9/3 Intubated. CTA> no evidence of active GI bleeding.  New finding of abnormal gallbladder with nondependent air, gallbladder wall air, pericholecystic and right upper quadrant information.  IR placed percutaneous cholecystotomy tube.  9/4 R. IJ central line placed with dialysis triple lumen catheter 9/5 CRRT initiated 9/6 Bedside EGD without any evidence of bleeding. CTA a/p without etiology for bleed 9/8 CRRT discontinued. Required ETT exchange yesterday due to blown cuff. 9/11 Bronchoscopic guided percutaneous tracheostomy placement.  Right IJ central line removed. 912: Endoscopic capsule still present.  Unable to proceed with MRI to evaluate for stroke 9/13: Endoscopic capsule at rectum but still present.  Working on Social research officer, government.  Initiating diuresis. Interim History / Subjective:  Appears comfortable Objective   Blood pressure 128/74, pulse 100, temperature 97.7 F (36.5 C), temperature source Axillary, resp. rate (!) 23, height 6' (1.829 m), weight 130.7 kg, SpO2 94 %. Vent Mode: PRVC FiO2 (%):  [40 %] 40 % Set Rate:  [18 bmp] 18 bmp Vt Set:  [620 mL] 620 mL PEEP:  [5 cmH20] 5 cmH20 Pressure Support:  [10 cmH20] 10 cmH20 Plateau Pressure:  [15 cmH20-16 cmH20] 15 cmH20   Intake/Output Summary (Last 24 hours) at 09/18/2021 1349 Last data filed at 09/18/2021 1200 Gross per 24 hour  Intake 140 ml  Output 1290 ml  Net -1150 ml   Filed Weights   09/15/21 0500  09/16/21 0500 09/18/21 0500  Weight: (!) 138.6 kg 134.8 kg 130.7 kg   Physical Exam: General 61 year old male patient currently ventilator support no acute distress HEENT normocephalic atraumatic pupils equal reactive size 6 trach midline site clean Pulmonary clear  to auscultation diminished bases, on weaning attempt earlier was apneic Cardiac: Regular rate and rhythm Abdomen soft not tender, percutaneous drain with bilious output Extremities warm dry brisk cap refill Neuro opens eyes, follows commands more briskly on the right, focal deficits appreciated. GU clear yellow Resolved Hospital Problem list   AKI - off CRRT, blood chemistry normalized Septic shock Hemorrhagic shock - resolved Thrombocytopenia Assessment & Plan:   Acute encephalopathy secondary to sepsis and polypharmacy History of CVA without residual deficits: CT head 9/8 with small vessel infarcts vs inflammatory process.  Patient beginning to have bilateral upper extremity and lower extremity movements does not appear to be favoring one side over the past 24 hours.  Once he passes endoscopy capsule we will plan for MRI.  Is continued on heparin and aspirin.  Continued encephalopathy likely secondary to sensitivity to sedatives.  Plan Minimize sedation RASS goal -1 MRI after he passes endoscopic capsule  Acute hypoxemic respiratory failure s/p percutaneous tracheostomy 9/11 Bronchoscopy guided percutaneous tracheostomy placement performed yesterday without any complications.  We will continue weaning trials, patient seems to be more awake over the last 24 hours. Plan Daily aerosol trach collar/pressure support trial VAP bundle PAD protocol As needed chest x-ray  ABLA secondary to lower GI bleed, jejunal diverticular and ulcers secondary to ischemia Acute blood loss and iron deficiency anemia, secondary to above - 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 and bedside EGD 9/6 Hemoglobin stable Plan A.m. CBC  Ecoli Cholecystitis s/p percutaneous cholecystostomy drain placement 9/3 percutaneous cholecystostomy drain placed. -250cc in drain over last 24 hrs. Plan Off antibiotics, drain management per interventional radiology  Bilateral nonobstructing nephrolithiasis  - suspect chronic BPH Plan Avoid hypotension Intermittent chemistries  DM2 Plan Sliding scale insulin Continue Semglee 20 units daily Goal glucose 140-180  HTN & HLD Plan Continue Norvasc furosemide as BUN/creatinine tolerate Holding Crestor  Pulmonary nodules in bilateral bases, L 66mm Family reports patient is a never smoker. Family does endorse dipping. Plan -no further follow up needed for a low risk patient based on fleischner criteria  Best Practice (right click and "Reselect all SmartList Selections" daily)   Diet/type: TF DVT prophylaxis: prophylactic heparin  GI prophylaxis: PPI Lines: Foley, PICC RUE Foley:  yes Code Status:  full code Last date of multidisciplinary goals of care discussion wife updated at bedside, I think he would be a excellent LTAC candidate once he gets his MRI completed Critical care time 32 minutes  Lelon Frohlich Student-NP [facility] Pulmonary/Critical Care Pager # [phone] OR # [phone] if no answer

## 2021-09-18 NOTE — Progress Notes (Signed)
Pharmacy Electrolyte Replacement  Recent Labs:  Recent Labs    09/17/21 0516  K 3.6  MG 2.1  PHOS 3.8  CREATININE 0.77    Low Critical Values (K </= 2.5, Phos </= 1, Mg </= 1) Present: None  Plan: 1x kcl orally per protocol

## 2021-09-18 NOTE — Progress Notes (Signed)
Error wrong patient noted on progress note from 9/14 ST elevation V1,2 and 3 (acute change on 9/14)--> this patient did not have ST changes this was from a different patient.  Will correct progress note on next documentation   Simonne Martinet ACNP-BC [facility] Pulmonary/Critical Care Pager # [phone] OR # [phone] if no answer

## 2021-09-18 NOTE — Progress Notes (Signed)
Pt is currently on trach collar 10L/ 40% and is tolerating it well at this time. Plan is to stay on Trach collar all night as long as Pt continues to tolerate it well. Vent at bedside. Rt will continue to monitor as needed.

## 2021-09-18 NOTE — Progress Notes (Signed)
   09/18/21 0941  Oxygen Therapy/Pulse Ox  O2 Device (S)  Tracheostomy Collar  O2 Therapy (S)  Oxygen  O2 Flow Rate (L/min) 10 L/min  FiO2 (%) 40 %  SpO2 93 %   Pt was placed on ATC 10L 40% and is tolerating well. RN aware,ventilator at bedside if needed. RT will continue to monitor.

## 2021-09-19 ENCOUNTER — Encounter (HOSPITAL_COMMUNITY): Payer: Self-pay | Admitting: Certified Registered Nurse Anesthetist

## 2021-09-19 ENCOUNTER — Inpatient Hospital Stay (HOSPITAL_COMMUNITY): Payer: Medicare HMO

## 2021-09-19 ENCOUNTER — Encounter (HOSPITAL_COMMUNITY)
Admission: EM | Disposition: A | Discharge status: Rehabilitation Facility | Payer: Self-pay | Source: Home / Self Care | Attending: Family Medicine

## 2021-09-19 ENCOUNTER — Other Ambulatory Visit (HOSPITAL_COMMUNITY): Payer: Self-pay | Admitting: Radiology

## 2021-09-19 DIAGNOSIS — K810 Acute cholecystitis: Secondary | ICD-10-CM

## 2021-09-19 DIAGNOSIS — T185XXA Foreign body in anus and rectum, initial encounter: Secondary | ICD-10-CM | POA: Diagnosis not present

## 2021-09-19 DIAGNOSIS — R933 Abnormal findings on diagnostic imaging of other parts of digestive tract: Secondary | ICD-10-CM | POA: Diagnosis not present

## 2021-09-19 DIAGNOSIS — R4182 Altered mental status, unspecified: Secondary | ICD-10-CM | POA: Diagnosis not present

## 2021-09-19 DIAGNOSIS — K922 Gastrointestinal hemorrhage, unspecified: Secondary | ICD-10-CM | POA: Diagnosis not present

## 2021-09-19 HISTORY — PX: FOREIGN BODY REMOVAL: SHX962

## 2021-09-19 HISTORY — PX: FLEXIBLE SIGMOIDOSCOPY: SHX5431

## 2021-09-19 LAB — BASIC METABOLIC PANEL
Anion gap: 9 (ref 5–15)
BUN: 16 mg/dL (ref 6–20)
CO2: 26 mmol/L (ref 22–32)
Calcium: 8.4 mg/dL — ABNORMAL LOW (ref 8.9–10.3)
Chloride: 110 mmol/L (ref 98–111)
Creatinine, Ser: 0.76 mg/dL (ref 0.61–1.24)
GFR, Estimated: >60 mL/min (ref >60)
Glucose, Bld: 242 mg/dL — ABNORMAL HIGH (ref 70–99)
Potassium: 3.7 mmol/L (ref 3.5–5.1)
Sodium: 145 mmol/L (ref 135–145)

## 2021-09-19 LAB — CBC WITH DIFFERENTIAL/PLATELET
Abs Immature Granulocytes: 0.05 10*3/uL (ref 0.00–0.07)
Basophils Absolute: 0.1 10*3/uL (ref 0.0–0.1)
Basophils Relative: 1 %
Eosinophils Absolute: 0.2 10*3/uL (ref 0.0–0.5)
Eosinophils Relative: 2 %
HCT: 34.6 % — ABNORMAL LOW (ref 39.0–52.0)
Hemoglobin: 10.7 g/dL — ABNORMAL LOW (ref 13.0–17.0)
Immature Granulocytes: 1 %
Lymphocytes Relative: 7 %
Lymphs Abs: 0.7 10*3/uL (ref 0.7–4.0)
MCH: 28.5 pg (ref 26.0–34.0)
MCHC: 30.9 g/dL (ref 30.0–36.0)
MCV: 92 fL (ref 80.0–100.0)
Monocytes Absolute: 0.7 10*3/uL (ref 0.1–1.0)
Monocytes Relative: 7 %
Neutro Abs: 8.4 10*3/uL — ABNORMAL HIGH (ref 1.7–7.7)
Neutrophils Relative %: 82 %
Platelets: 450 10*3/uL — ABNORMAL HIGH (ref 150–400)
RBC: 3.76 MIL/uL — ABNORMAL LOW (ref 4.22–5.81)
RDW: 14.9 % (ref 11.5–15.5)
WBC: 10.1 10*3/uL (ref 4.0–10.5)
nRBC: 0 % (ref 0.0–0.2)

## 2021-09-19 LAB — POCT I-STAT 7, (LYTES, BLD GAS, ICA,H+H)
Acid-Base Excess: 2 mmol/L (ref 0.0–2.0)
Bicarbonate: 26.5 mmol/L (ref 20.0–28.0)
Calcium, Ion: 1.27 mmol/L (ref 1.15–1.40)
HCT: 31 % — ABNORMAL LOW (ref 39.0–52.0)
Hemoglobin: 10.5 g/dL — ABNORMAL LOW (ref 13.0–17.0)
O2 Saturation: 97 %
Patient temperature: 97.8
Potassium: 3.8 mmol/L (ref 3.5–5.1)
Sodium: 146 mmol/L — ABNORMAL HIGH (ref 135–145)
TCO2: 28 mmol/L (ref 22–32)
pCO2 arterial: 40.4 mmHg (ref 32–48)
pH, Arterial: 7.423 (ref 7.35–7.45)
pO2, Arterial: 85 mmHg (ref 83–108)

## 2021-09-19 LAB — GLUCOSE, CAPILLARY
Glucose-Capillary: 225 mg/dL — ABNORMAL HIGH (ref 70–99)
Glucose-Capillary: 214 mg/dL — ABNORMAL HIGH (ref 70–99)
Glucose-Capillary: 217 mg/dL — ABNORMAL HIGH (ref 70–99)
Glucose-Capillary: 244 mg/dL — ABNORMAL HIGH (ref 70–99)
Glucose-Capillary: 262 mg/dL — ABNORMAL HIGH (ref 70–99)
Glucose-Capillary: 248 mg/dL — ABNORMAL HIGH (ref 70–99)
Glucose-Capillary: 273 mg/dL — ABNORMAL HIGH (ref 70–99)

## 2021-09-19 LAB — MAGNESIUM: Magnesium: 2.1 mg/dL (ref 1.7–2.4)

## 2021-09-19 LAB — PHOSPHORUS: Phosphorus: 3.2 mg/dL (ref 2.5–4.6)

## 2021-09-19 SURGERY — SIGMOIDOSCOPY, FLEXIBLE
Anesthesia: Moderate Sedation

## 2021-09-19 MED ORDER — MIDAZOLAM HCL (PF) 5 MG/ML IJ SOLN
INTRAMUSCULAR | Status: AC
  Filled 2021-09-19: qty 1

## 2021-09-19 MED ORDER — SODIUM CHLORIDE 0.9 % IV SOLN: INTRAVENOUS | Status: DC

## 2021-09-19 MED ORDER — INSULIN ASPART 100 UNIT/ML IJ SOLN
0.0000 [IU] | INTRAMUSCULAR | Status: DC
  Administered 2021-09-19: 11 [IU] via SUBCUTANEOUS
  Administered 2021-09-19: 7 [IU] via SUBCUTANEOUS
  Administered 2021-09-19: 11 [IU] via SUBCUTANEOUS
  Administered 2021-09-20: 4 [IU] via SUBCUTANEOUS
  Administered 2021-09-20: 11 [IU] via SUBCUTANEOUS
  Administered 2021-09-20: 7 [IU] via SUBCUTANEOUS
  Administered 2021-09-20: 11 [IU] via SUBCUTANEOUS
  Administered 2021-09-20: 4 [IU] via SUBCUTANEOUS
  Administered 2021-09-20: 11 [IU] via SUBCUTANEOUS
  Administered 2021-09-21: 7 [IU] via SUBCUTANEOUS
  Administered 2021-09-21: 11 [IU] via SUBCUTANEOUS
  Administered 2021-09-21: 4 [IU] via SUBCUTANEOUS
  Administered 2021-09-21: 15 [IU] via SUBCUTANEOUS
  Administered 2021-09-21 – 2021-09-22 (×2): 7 [IU] via SUBCUTANEOUS
  Administered 2021-09-22: 4 [IU] via SUBCUTANEOUS
  Administered 2021-09-22: 7 [IU] via SUBCUTANEOUS
  Administered 2021-09-22 (×2): 11 [IU] via SUBCUTANEOUS
  Administered 2021-09-22: 4 [IU] via SUBCUTANEOUS
  Administered 2021-09-23 (×3): 7 [IU] via SUBCUTANEOUS
  Administered 2021-09-23: 4 [IU] via SUBCUTANEOUS
  Administered 2021-09-23: 7 [IU] via SUBCUTANEOUS
  Administered 2021-09-24 (×2): 15 [IU] via SUBCUTANEOUS
  Administered 2021-09-24: 11 [IU] via SUBCUTANEOUS
  Administered 2021-09-24: 20 [IU] via SUBCUTANEOUS
  Administered 2021-09-24 (×2): 15 [IU] via SUBCUTANEOUS
  Administered 2021-09-25: 7 [IU] via SUBCUTANEOUS
  Administered 2021-09-25: 11 [IU] via SUBCUTANEOUS
  Administered 2021-09-25: 4 [IU] via SUBCUTANEOUS
  Administered 2021-09-25: 11 [IU] via SUBCUTANEOUS
  Administered 2021-09-25: 4 [IU] via SUBCUTANEOUS
  Administered 2021-09-25: 7 [IU] via SUBCUTANEOUS
  Administered 2021-09-26: 4 [IU] via SUBCUTANEOUS
  Administered 2021-09-26: 11 [IU] via SUBCUTANEOUS
  Administered 2021-09-26: 7 [IU] via SUBCUTANEOUS
  Administered 2021-09-26: 15 [IU] via SUBCUTANEOUS
  Administered 2021-09-26: 4 [IU] via SUBCUTANEOUS
  Administered 2021-09-26: 11 [IU] via SUBCUTANEOUS
  Administered 2021-09-27 (×3): 3 [IU] via SUBCUTANEOUS
  Administered 2021-09-27: 11 [IU] via SUBCUTANEOUS

## 2021-09-19 MED ORDER — POTASSIUM CHLORIDE 20 MEQ PO PACK
40.0000 meq | PACK | Freq: Once | ORAL | Status: AC
  Administered 2021-09-19: 40 meq
  Filled 2021-09-19: qty 2

## 2021-09-19 MED ORDER — FENTANYL CITRATE (PF) 100 MCG/2ML IJ SOLN
INTRAMUSCULAR | Status: AC
  Filled 2021-09-19: qty 2

## 2021-09-19 MED ORDER — INSULIN ASPART 100 UNIT/ML IJ SOLN
3.0000 [IU] | INTRAMUSCULAR | Status: DC
  Administered 2021-09-19 – 2021-09-20 (×4): 3 [IU] via SUBCUTANEOUS

## 2021-09-19 MED ORDER — DEXTROSE 10 % IV SOLN: INTRAVENOUS | Status: DC

## 2021-09-19 NOTE — Progress Notes (Signed)
SLP Cancellation Note  Patient Details Name: Troy Randall MRN: [id number] DOB: [birth year]   Cancelled treatment:        Attempted to see pt x2 today for PMSV evaluation and clinical swallow assessment.  Pt unavailable with provider and EKG.  Pt also with sigmoidoscopy scheduled later this date.  SLP to return as schedule permits.   Kerrie Pleasure, MA, CCC-SLP Acute Rehabilitation Services Office: [phone] 09/19/2021, 11:21 AM

## 2021-09-19 NOTE — Progress Notes (Signed)
Referring Physician(s): Dr, Silvestre Gunner  Supervising Physician: Ruel Favors  Patient Status:  [facility] - In-pt  Chief Complaint:  Acute cholecystitis s/p cholecystomy tube placement on 9.3.23 with Dr. Odis Luster  Subjective:  Unable to assess. Patient altered. Wife and brother at bedside. No questions or concerns at this time  Allergies: Metformin and related and Penicillins  Medications: Prior to Admission medications   Medication Sig Start Date End Date Taking? Authorizing Provider  amLODipine (NORVASC) 10 MG tablet Take 1 tablet (10 mg total) by mouth daily. 06/15/17  Yes Aliene Beams, MD  aspirin EC 81 MG tablet Take 81 mg by mouth daily. Swallow whole.   Yes [provider]  clopidogrel (PLAVIX) 75 MG tablet Take 1 tablet (75 mg total) by mouth daily. 04/30/21  Yes Azucena Fallen, MD  glipiZIDE (GLUCOTROL XL) 10 MG 24 hr tablet Take 1 tablet (10 mg total) by mouth daily with breakfast. 04/16/17  Yes Hagler, Fleet Contras, MD  levocetirizine (XYZAL) 5 MG tablet Take 1 tablet by mouth at bedtime.   Yes [provider]  lisinopril (PRINIVIL,ZESTRIL) 40 MG tablet Take 1 tablet (40 mg total) by mouth daily. 05/12/17  Yes Aliene Beams, MD  metFORMIN (GLUCOPHAGE-XR) 500 MG 24 hr tablet Take 500-1,000 mg by mouth in the morning and at bedtime. 02/09/21  Yes [provider]  rosuvastatin (CRESTOR) 40 MG tablet Take 1 tablet (40 mg total) by mouth daily. 04/29/21  Yes Azucena Fallen, MD  tamsulosin (FLOMAX) 0.4 MG CAPS capsule Take 0.4 mg by mouth daily. 02/12/21  Yes [provider]  traZODone (DESYREL) 150 MG tablet Take 150 mg by mouth at bedtime. 02/04/21  Yes [provider]  TRESIBA FLEXTOUCH 200 UNIT/ML FlexTouch Pen Inject 20 Units into the skin in the morning and at bedtime. 03/14/21  Yes [provider]  BD VEO INSULIN SYRINGE U/F 31G X 15/64" 1 ML MISC  USE AS DIRECTED 06/08/17   Aliene Beams, MD  glucose blood (ONETOUCH VERIO) test  strip TEST twice a day 05/04/17   Aliene Beams, MD  Insulin Pen Needle (NOVOTWIST) 32G X 5 MM MISC Use two daily to inject Victoza and Toujeo. 04/25/15   Reather Littler, MD  [facility] DELICA LANCETS 33G MISC Use to check blood sugar once a day dx code E11.65 11/21/14   Reather Littler, MD     Vital Signs: BP (!) 169/81   Pulse 93   Temp 98.1 F (36.7 C) (Axillary)   Resp (!) 21   Ht 6' (1.829 m)   Wt 283 lb 4.7 oz (128.5 kg)   SpO2 96%   BMI 38.42 kg/m   Physical Exam Constitutional:      Appearance: He is ill-appearing.  Cardiovascular:     Rate and Rhythm: Normal rate and regular rhythm.     Heart sounds: Normal heart sounds.  Abdominal:     Comments: Positive RUQ drain to gravity bag. Site is unremarkable with no erythema, edema, tenderness, bleeding or drainage noted at exit site. Suture intact. Dressing is clean dry and intact. 100 ml of  dark brown colored fluid noted in gravity bag. Drain is able to be flushed easily.        Imaging: DG Pelvis Portable  Result Date: 09/18/2021 CLINICAL DATA:  Pre MRI.  Evaluating for endoscopic capsule. EXAM: PORTABLE PELVIS 1-2 VIEWS COMPARISON:  AP pelvis 09/17/2021, 09/16/2021 at 6:24 a.m., 09/13/2021, CT abdomen and pelvis 09/10/2021 and 09/06/2021 FINDINGS: A metallic structure corresponding to the  endoscopy capsule seen on multiple recent prior studies overlies the mid pelvis and appears unchanged in position. This appears to again be in the region of the rectum as seen on 09/10/2021 CT. Note is made on 09/06/2021 CT this was in the duodenum. Hernia mesh coils are again seen overlying the right lower quadrant and right hemipelvis. The distal tip of a weighted enteric tube overlies the right upper abdominal quadrant, likely within the distal second portion of the duodenum and unchanged from 09/16/2021. Air is seen within nondistended loops of small and large bowel. Mild-to-moderate multilevel degenerative disc changes of the thoracolumbar  spine. IMPRESSION: The endoscopy capsule again overlies the midline pelvis likely again in the region of the rectum as seen on 09/10/2021 CT. Electronically Signed   By: Neita Garnet M.D.   On: 09/18/2021 09:44   DG Pelvis Portable  Result Date: 09/17/2021 CLINICAL DATA:  Rectal bleeding EXAM: PORTABLE PELVIS 1-2 VIEWS COMPARISON:  Previous CT done on 09/10/2021 FINDINGS: There is 1.4 cm metallic structure in region of rectum corresponding to endoscopy capsule seen in the CT. No fracture or dislocation is seen. Small bony spurs are seen in both hips. There is evidence of previous ventral hernia repair. IMPRESSION: No significant radiographic abnormalities are seen in pelvis. Possible endoscopy capsule is seen in the region of rectum. Electronically Signed   By: Ernie Avena M.D.   On: 09/17/2021 17:58   DG Abd Portable 1V  Result Date: 09/16/2021 CLINICAL DATA:  Feeding tube placement EXAM: PORTABLE ABDOMEN - 1 VIEW COMPARISON:  Same day abdominal radiograph FINDINGS: Feeding tube tip overlies the descending duodenum. Gaseous distension of large bowel. There is a percutaneous pigtail drain overlying the right upper abdomen. Vascular coil and surgical clips noted overlying the left hemiabdomen. Prior lower abdominal hernia repair. IMPRESSION: Feeding tube tip overlies the descending duodenum. Persistent gaseous distention of the colon. Electronically Signed   By: Caprice Renshaw M.D.   On: 09/16/2021 09:38   DG Abd Portable 1V  Result Date: 09/16/2021 CLINICAL DATA:  61 year old male for planned MRI.  Metal screening. EXAM: PORTABLE ABDOMEN - 1 VIEW COMPARISON:  Abdominal radiographs 09/13/2021. CT Abdomen and Pelvis 09/10/2021. Previous ventral FINDINGS: Portable [facility] views at 0624 hours. Previous ventral abdominal hernia repair with mesh. Embolization coils in the left abdomen seen associated with jejunum small bowel corresponding to active GI bleeding by CTA earlier this month. Nearby surgical clip  there also. Endoscopy capsule continues to project at the rectum as seen by CT 09/10/2021. Increased large and small bowel gas, but no abnormally dilated loops. No acute osseous abnormality identified. IMPRESSION: 1. Retained endoscopy capsule in the rectum. Left upper quadrant embolization coils, surgical clips. Previous ventral abdominal hernia repair with mesh. 2. Bowel-gas pattern now raising the possibility of ileus. Electronically Signed   By: Odessa Fleming M.D.   On: 09/16/2021 08:30   DG Chest Port 1 View  Result Date: 09/15/2021 CLINICAL DATA:  Status post tracheostomy. EXAM: PORTABLE CHEST 1 VIEW COMPARISON:  09/12/2021 FINDINGS: Patient now has a tracheostomy tube. The right jugular dialysis catheter has been removed. Right arm PICC line with the tip near the superior cavoatrial junction. Increased densities at the right lung base that may be associated with volume loss. Stable atelectasis or volume loss at the left lung base. Negative for pneumothorax. IMPRESSION: 1. Placement of tracheostomy tube. 2. Increased densities at the right lung base are likely associated with volume loss in the right lung. Electronically Signed   By:  Richarda Overlie M.D.   On: 09/15/2021 16:11    Labs:  CBC: Recent Labs    09/15/21 0549 09/16/21 0423 09/17/21 0516 09/19/21 0836 09/19/21 0942  WBC 9.6 10.5 10.0 10.1  --   HGB 9.5* 10.0* 10.1* 10.7* 10.5*  HCT 29.8* 32.5* 32.2* 34.6* 31.0*  PLT 273 328 360 450*  --     COAGS: Recent Labs    04/28/21 1504 09/05/21 0307 09/06/21 2240 09/07/21 2056  INR 1.0 1.2 1.6* 2.0*  APTT 26  --   --  32    BMP: Recent Labs    09/15/21 0549 09/16/21 0423 09/17/21 0516 09/19/21 0836 09/19/21 0942  NA 143 143 144 145 146*  K 3.6 3.8 3.6 3.7 3.8  CL 112* 110 110 110  --   CO2 24 25 27 26   --   GLUCOSE 193* 160* 141* 242*  --   BUN 18 19 19 16   --   CALCIUM 7.3* 8.0* 8.1* 8.4*  --   CREATININE 0.67 0.83 0.77 0.76  --   GFRNONAA >60 >60 >60 >60  --     LIVER  FUNCTION TESTS: Recent Labs    04/29/21 0502 09/04/21 1238 09/06/21 2240 09/07/21 2054 09/08/21 1808 09/12/21 0308 09/13/21 0327 09/14/21 0504 09/15/21 0549  BILITOT 0.5 0.8 0.4 0.5  --   --   --   --   --   AST 20 15 21  33  --   --   --   --   --   ALT 22 17 16  32  --   --   --   --   --   ALKPHOS 34* 35* 21* 124  --   --   --   --   --   PROT 6.8 6.9 3.7* 3.2*  --   --   --   --   --   ALBUMIN 3.6 3.7 1.8* <1.5*   < > <1.5* <1.5* 1.6* 1.6*   < > = values in this interval not displayed.    Assessment and Plan:  61 y.o. male inpatient. History of HTN, HLD, DM, CVA ( on ASA and plavix). Presented to the ED at [facility] on 8.31.23 with dark stools, dizzy and lightheaded. EGD showed no active bleeding but large clots throughout the entire colon. Repeat CTA showed active extravasation proximal jejunum secondary to presumed small bowel divertiuli. CT also showed acute cholecystitis. IR performed an arteriogram ans cholecystomy tube placed on 9.3.23. Now in acute encephalopathy secondary to sepsis  Drain Location: RUQ Cholecystomy tube Size: Fr size: 10 Fr Date of placement: 9.4.23  Currently to: Drain collection device: gravity 24 hour output:  Output by Drain (mL) 09/17/21 0700 - 09/17/21 1459 09/17/21 1500 - 09/17/21 2259 09/17/21 2300 - 09/18/21 0659 09/18/21 0700 - 09/18/21 1459 09/18/21 1500 - 09/18/21 2259 09/18/21 2300 - 09/19/21 0659 09/19/21 0700 - 09/19/21 1319  Closed System Drain 1 Lateral RUQ Other (Comment) 10.2 Fr. 250  240 150 100 50 25  Cultures grew e. Coli. 100 ml of dark fluid noted to be in the gravity bag.   Interval imaging/drain manipulation:  Abd 1 view 9.9.23  Current examination: Flushes/aspirates easily.  Insertion site unremarkable. Suture in place. Dressed appropriately.   Plan: Continue TID flushes with 5 cc NS. Record output Q shift. Dressing changes QD or PRN if soiled.  Call IR APP or on call IR MD if difficulty flushing or sudden change in drain  output.  Discharge planning: Please contact IR APP or on call IR MD prior to patient d/c to ensure appropriate follow up plans are in place. Typically patient will follow up with IR in 6-8 weeks  post d/c  unless patient has his gallbladder removed prior. IR scheduler will contact patient with date/time of appointment. Patient will need to flush drain QD with 5 cc NS, record output QD, dressing changes every 2-3 days or earlier if soiled.   IR will continue to follow - please call with questions or concerns.  Electronically Signed: Alene Mires, NP 09/19/2021, 1:11 PM   I spent a total of 15 Minutes at the patient's bedside AND on the patient's hospital floor or unit, greater than 50% of which was counseling/coordinating care for cholecystomy tube placement.

## 2021-09-19 NOTE — Inpatient Diabetes Management (Signed)
Inpatient Diabetes Program Recommendations  AACE/ADA: New Consensus Statement on Inpatient Glycemic Control   Target Ranges:  Prepandial:   less than 140 mg/dL      Peak postprandial:   less than 180 mg/dL (1-2 hours)      Critically ill patients:  140 - 180 mg/dL    Latest Reference Range & Units 09/19/21 03:17 09/19/21 07:33  Glucose-Capillary 70 - 99 mg/dL 182 (H)  Novolog 5 units 214 (H)  Novolog 5 units    Latest Reference Range & Units 09/18/21 07:57 09/18/21 09:43 09/18/21 12:02 09/18/21 15:30 09/18/21 19:23 09/18/21 23:13  Glucose-Capillary 70 - 99 mg/dL 993 (H) 716 (H)  Novolog 3 units  Semglee 20 units @9 :37 174 (H)  Novolog 3 units 171 (H)  Novolog 3 units 177 (H)  Novolog 3 units 214 (H)  Novolog 5 units   Review of Glycemic Control  Current orders for Inpatient glycemic control: Semglee 20 units daily, Novolog 3 units Q4H, Novolog 0-15 units Q4H; Vital @ 60 ml/hr, D10 @ 40 ml/hr  Inpatient Diabetes Program Recommendations:    Insulin: Noted Novolog 3 units Q4H for tube feeding coverage ordered today. If glucose continues to be consistently over 180 mg/dl, please consider increasing Semglee to 25 units daily and tube feed coverage to Novolog 5 units Q4H (once at goal tube feeding rate).  Thanks, , RN, MSN, CDCES Diabetes Coordinator Inpatient Diabetes Program [phone] (Team Pager from 8am to 5pm)

## 2021-09-19 NOTE — Procedures (Signed)
Patient Name: Troy Randall  MRN: [id number]  Epilepsy Attending: Charlsie Quest  Referring Physician/Provider: Belva Agee, MD Date: 09/19/2021 Duration: 22.50 mins  Patient history: 61yo M with ams. EEG to evaluate for seizure  Level of alertness: Awake  AEDs during EEG study: None  Technical aspects: This EEG study was done with scalp electrodes positioned according to the 10-20 International system of electrode placement. Electrical activity was reviewed with band pass filter of 1-70Hz , sensitivity of 7 uV/mm, display speed of 62mm/sec with a 60Hz  notched filter applied as appropriate. EEG data were recorded continuously and digitally stored.  Video monitoring was available and reviewed as appropriate.  Description: EEG showed continuous generalized predominantly 5-8Hz  theta-alpha activity admixed with 2-3Hz  delta slowing. Hyperventilation and photic stimulation were not performed.     ABNORMALITY - Continuous slow, generalized  IMPRESSION: This study is suggestive of moderate diffuse encephalopathy, nonspecific etiology. No seizures or epileptiform discharges were seen throughout the recording.  Charis Juliana 

## 2021-09-19 NOTE — Progress Notes (Signed)
EEG complete - results pending 

## 2021-09-19 NOTE — Progress Notes (Addendum)
NAME:  Troy Randall, MRN:  [id number], DOB:  1960/08/31, LOS: 15 ADMISSION DATE:  09/04/2021, CONSULTATION DATE:  09/06/21 REFERRING MD:  [name], CHIEF COMPLAINT:  melena   History of Present Illness:   Troy Randall is an 61 y.o. male with prior history of HTN, HLD, DM, and CVA on ASA/ plavix who presented to [facility] on 8/31 after developing frequent 6-7 dark stools and feeling dizzy and lightheaded on standing.  No hx of NSAIDs.  FOBT positive and initial Hgb 12.2.    Patient admitted to [facility] with GI consulting.  Remained orthostatic with progressive decline in Hgb.  CTA abdomen did not show any active bleed, therefore underwent colonoscopy > no active bleed found but large clots throughout entire colon.  Then underwent EGD which showed a small hiatal hernia, erosive gastropathy with no stigmata of recent bleeding, and non-bleeding duodenal diverticulum.   Underwent then small bowel enteroscopy which noted intermittent blood pumping in the proximal jejunum but was unable to be reached despite multiple attempts, was injected and clip placed.  Repeat CTA repeated, which showed positive jejunal diverticular bleeding. Patient was transferred to [facility] for IR involvement. In IR active extravasation  at SMA territory, jejunal arcade branch, successful coil embolization.  PCCM was consulted for transfer and admission.  Pertinent  Medical History  HTN, HLD, DM, CVA on ASA/ plavix  Significant Hospital Events: Including procedures, antibiotic start and stop dates in addition to other pertinent events   8/31 presented to [facility], [name] Admit, Gi Consult 9/1 CTA> no evidence of active GI bleed, 2 U PRBC 9/2 2 U PRBC, colonoscopy > no active bleed found but large clots throughout entire colon. EGD> small hiatal hernia, erosive gastropathy with no stigmata of recent bleeding, and non-bleeding duodenal diverticulum.   Underwent then small bowel enteroscopy which noted intermittent blood pumping in the proximal jejunum  but was unable to be reached despite multiple attempts, was injected and clip placed. Repeat CTA repeated, which showed positive jejunal diverticular bleeding. IR> active extrav at [facility] territory, jejunal arcade branch, successful coil embo 9/3 Intubated. CTA> no evidence of active GI bleeding.  New finding of abnormal gallbladder with nondependent air, gallbladder wall air, pericholecystic and right upper quadrant information.  IR placed percutaneous cholecystotomy tube.  9/4 R. IJ central line placed with dialysis triple lumen catheter 9/5 CRRT initiated 9/6 Bedside EGD without any evidence of bleeding. CTA a/p without etiology for bleed 9/8 CRRT discontinued. Required ETT exchange yesterday due to blown cuff. 9/11 Bronchoscopic guided percutaneous tracheostomy placement.  Right IJ central line removed. Interim History / Subjective:  Overnight patient follow some commands with upper and lower extremity movement. Able to turn head for me but not follow any other commands. Still has no evidence of tracking. Two bowel movements overnight, nursing staff did not see endoscopy capsule.  Objective   Blood pressure 128/74, pulse 86, temperature (!) 97.4 F (36.3 C), temperature source Axillary, resp. rate (!) 23, height 6' (1.829 m), weight 128.5 kg, SpO2 98 %. Vent Mode: PRVC FiO2 (%):  [40 %] 40 % Set Rate:  [18 bmp] 18 bmp Vt Set:  [620 mL] 620 mL PEEP:  [5 cmH20] 5 cmH20 Plateau Pressure:  [15 cmH20] 15 cmH20   Intake/Output Summary (Last 24 hours) at 09/19/2021 0650 Last data filed at 09/19/2021 0600 Gross per 24 hour  Intake 406 ml  Output 1400 ml  Net -994 ml    Filed Weights   09/16/21 0500 09/18/21 0500  09/19/21 0500  Weight: 134.8 kg 130.7 kg 128.5 kg   Physical Exam: General: Chronically ill-appearing, no acute distress HENT: [place], AT, MV via tracheostomy Eyes: EOMI, no scleral icterus.  Respiratory: Clear to auscultation bilaterally.  No crackles, wheezing or  rales Cardiovascular: RRR, -M/R/G, no JVD GI: BS+, soft, non-tender. Percutaneous chole drain in place.  Extremities: No edema present Neuro: Eyes open, not tracking. Following some commands GU: External catheter  Resolved Hospital Problem list   AKI - off CRRT Septic shock Assessment & Plan:   Acute encephalopathy secondary to sepsis History of CVA without residual deficits, concern for recurrence CT head 9/8 with small vessel infarcts vs inflammatory process. Continues not to track. Obtain MRI when able once he passes capsule endoscopy.  -ABG normal -Spot EEG to assess for seizure like activity -Monitor mental status and avoid sedating medications if possible -Continue aspirin, plavix and crestor -Continue bowel regimen today  Acute hypoxemic respiratory failure s/p percutaneous tracheostomy 9/11 Doing well off of ventilator.  -Minimize sedation  Hemorrhagic shock - resolved ABLA secondary to lower GI bleed, jejunal diverticular and ulcers secondary to ischemia Acute blood loss and iron deficiency anemia, secondary to above - 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 and bedside EGD 9/6 No further episodes of melena. Awaiting passing of endoscopy capsule.  -Daily CBC -Will need iron repletion outpatient -Transfuse PRBC if HBG less than 7  Ecoli Cholecystitis s/p percutaneous cholecystostomy drain placement 9/3 percutaneous cholecystostomy drain placed. -250 cc in drain over last 24 hrs. -Completed total 7d of antibiotics: Ceftriaxone  -Continue to monitor drain -Trend fever/WBC -Will need outpatient surgery and IR follow up  AKI  - off CRRT 9/8 Bilateral nonobstructing nephrolithiasis - suspect chronic BPH Transitioned to oral lasix 40 mg daily with adequate urinary output -Cr pending -Nephrology no longer following -continue lasix 40 mg PO daily -Trend electrolytes, replete as needed -Strict I/O  Thrombocytopenia - normalized  DM2 Tube feeds  restarted yesterday, restart novolog 3 U q4h, SSI q4h and semglee 20 u daily. At goal.  -Blood Glucose goal 140-180 -Adjust as needed to meet goal above  HTN HLD -Amlodipine 10 mg daily -Hold home lisinopril  -Restarted crestor   Pulmonary nodules in bilateral bases, L 49mm Family reports patient is a never smoker. Family does endorse dipping. -no further follow up needed for a low risk patient based on fleischner criteria  Best Practice (right click and "Reselect all SmartList Selections" daily)   Diet/type: TF DVT prophylaxis: prophylactic heparin  GI prophylaxis: PPI Lines: Foley, PICC RUE Foley:  yes Code Status:  full code Last date of multidisciplinary goals of care discussion [Full scope- Wife updated at bedside this am  Thalia Bloodgood DO  Internal Medicine Resident PGY-3 [place]  Pager: [phone]

## 2021-09-19 NOTE — Progress Notes (Addendum)
Nutrition Follow-up  DOCUMENTATION CODES:   Obesity unspecified  INTERVENTION:   Continue TF via postpyloric Cortrak: Vital 1.5 increasing by 10 ml every 8 hours to goal rate of 60 ml/h (1440 ml per day) Prosource TF20 60 ml BID  Provides 2320 kcal, 137 gm protein, 1003 ml free water daily  NUTRITION DIAGNOSIS:   Increased nutrient needs related to acute illness as evidenced by estimated needs. - Ongoing   GOAL:   Patient will meet greater than or equal to 90% of their needs - Met with TF  MONITOR:   TF tolerance  REASON FOR ASSESSMENT:   Consult Enteral/tube feeding initiation and management  ASSESSMENT:   Pt with PMH of HTN, HLD, DM, CVA on ASA/plavix admitted 8/31 from [facility] due to gi bleed.  8/31 admit 9/2 s/p colonoscopy: no active bleed but large clots throughout entire colon; s/p EGD: small hital hernia, erosive gastropathy, non-bleeding duodenal diverticulum; s/p small bowel enteroscopy: jejunal diverticular bleeding s/p coil embolization  9/3 intubated; new cholecystitis s/p IR for cholecystectomy tube  9/4 CRRT started  9/8 CRRT stopped  9/11 trach placed 9/12 Cortrak placed with tip in the descending duodenum  Currently receiving Vital 1.5 at 30 ml/h, tolerating well per discussion with RN. Advancing 10 ml every 8 hours to goal rate of 60 ml/h. Awaiting passing of endoscopy capsule.   Patient is currently on ventilator support via trach. MV: 10.5 L/min Temp (24hrs), Avg:97.8 F (36.6 C), Min:97.4 F (36.3 C), Max:98.2 F (36.8 C)  Medications reviewed and include Colace, Lasix, NovoLog SSI + 3 units q4h, Semglee, Protonix, Miralax, potassium chloride.  Labs reviewed. Na 146 CBG 225-214  UOP: 1100 mL x 24 hours RUQ drain: 300 mL x 24 hours   Net IO Since Admission: -4,636.18 mL [09/19/21 1042]  Diet Order:  Diet Order             Diet NPO time specified  Diet effective now                   EDUCATION NEEDS:   No education needs have  been identified at this time  Skin:  Skin Assessment: Reviewed RN Assessment (MASD to rectum)  Last BM:  9/15 type 6  Height:   Ht Readings from Last 1 Encounters:  09/11/21 6' (1.829 m)    Weight:   Wt Readings from Last 1 Encounters:  09/19/21 128.5 kg    BMI:  Body mass index is 38.42 kg/m.  Estimated Nutritional Needs:   Kcal:  2200-2500  Protein:  130-150 grams  Fluid:  >2 L/day   Lucas Mallow RD, LDN, CNSC Please refer to Amion for contact information.

## 2021-09-19 NOTE — Op Note (Signed)
[facility] Patient Name: Troy Randall Procedure Date : 09/19/2021 MRN: [id number] Attending MD: Jeani Hawking , MD Date of Birth: Sep 20, 1960 CSN: [id number] Age: 61 Admit Type: Inpatient Procedure:                Flexible Sigmoidoscopy Indications:              Abnormal abdominal x-ray of the GI tract Providers:                Jeani Hawking, MD Referring MD:              Medicines:                None Complications:            No immediate complications. Estimated Blood Loss:     Estimated blood loss: none. Procedure:                Pre-Anesthesia Assessment:                           - Prior to the procedure, a History and Physical                            was performed, and patient medications and                            allergies were reviewed. The patient's tolerance of                            previous anesthesia was also reviewed. The risks                            and benefits of the procedure and the sedation                            options and risks were discussed with the patient.                            All questions were answered, and informed consent                            was obtained. Prior Anticoagulants: The patient has                            taken no previous anticoagulant or antiplatelet                            agents. ASA Grade Assessment: IV - A patient with                            severe systemic disease that is a constant threat                            to life. After reviewing the risks and benefits,  the patient was deemed in satisfactory condition to                            undergo the procedure.                           - No sedation medications were administered.                           After obtaining informed consent, the scope was                            passed under direct vision. The [id number]-[id number] ([id number])                            Olympus endoscope was introduced through the  anus                            and advanced to the the sigmoid colon. The flexible                            sigmoidoscopy was accomplished without difficulty.                            The patient tolerated the procedure well. The                            quality of the bowel preparation was adequate. Scope In: Scope Out: Findings:      A foreign body was found in the rectum. Removal of an unattached BRAVO       capsule was accomplished with a Roth net. Impression:               - Foreign body in the rectum. Removal was                            successful. Recommendation:           - Return patient to hospital ward for ongoing care. Procedure Code(s):        --- Professional ---                           [phone], Sigmoidoscopy, flexible; with removal of                            foreign body(s) Diagnosis Code(s):        --- Professional ---                           T18.5XXA, Foreign body in anus and rectum, initial                            encounter                           R93.3, Abnormal findings on diagnostic imaging of  other parts of digestive tract CPT copyright 2019 American Medical Association. All rights reserved. The codes documented in this report are preliminary and upon coder review may  be revised to meet current compliance requirements. Carol Ada, MD Carol Ada, MD 09/19/2021 2:11:28 PM This report has been signed electronically. Number of Addenda: 0

## 2021-09-19 NOTE — Progress Notes (Signed)
Patient seen today by trach team for consult.  No education is needed at this time.  All necessary equipment is at beside.   Will continue to follow for progression.  

## 2021-09-19 NOTE — Progress Notes (Signed)
Pharmacy Electrolyte Replacement  Recent Labs:  Recent Labs    09/17/21 0516  K 3.6  MG 2.1  PHOS 3.8  CREATININE 0.77    Low Critical Values (K </= 2.5, Phos </= 1, Mg </= 1) Present: None  Plan: One time oral Kcl per protocol.

## 2021-09-19 NOTE — Progress Notes (Signed)
Tube feeds stopped  at 10:55 pending flexible sigmoidoscopy today.

## 2021-09-19 NOTE — Progress Notes (Addendum)
ICU Pharmacy Insulin Protocol   CBGs above goal 140-180 mg/dL: No > 161    Current regimen (include units of SSI in last 24hr): on moderate SSI (22 units in last 24 hours), lantus 20 daily   Medications affecting CBG levels: Tube feeds restarted, currently not running @ goal    Plan: Add tube feed coverage 3 units Q4H.  Continue lantus 20 units once daily.  Will monitor for up-titration of tube feed coverage as rate gets to goal.    Update from 15:00, patient's tube feeds have been held due to procedure with GI. Tube feeds to restart after, but blood sugars have been in 240's this afternoon due to tube feed coverage being held. Will increase SSI in the meantime until tube feed coverage can be given.

## 2021-09-19 NOTE — TOC Progression Note (Signed)
Transition of Care [facility]) - Initial/Assessment Note    Patient Details  Name: Troy Randall MRN: [id number] Date of Birth: Apr 12, 1960  [facility] CM/SW Contact:    Ralene Bathe, LCSWA Phone Number: 09/19/2021, 3:39 PM  Clinical Narrative:                 CSW sent assisted patient's wife with getting FMLA paperwork completed and returned to employer.  LTACH is pending insurance auth.  TOC will continue to follow.      Barriers to Discharge: Continued Medical Work up   Patient Goals and CMS Choice Patient states their goals for this hospitalization and ongoing recovery are:: To return home      Expected Discharge Plan and Services     Discharge Planning Services: CM Consult   Living arrangements for the past 2 months: Single Family Home                                      Prior Living Arrangements/Services Living arrangements for the past 2 months: Single Family Home Lives with:: Spouse Patient language and need for interpreter reviewed:: Yes Do you feel safe going back to the place where you live?: Yes      Need for Family Participation in Patient Care: Yes (Comment) Care giver support system in place?: Yes (comment) Current home services: DME (All necessary DME's) Criminal Activity/Legal Involvement Pertinent to Current Situation/Hospitalization: No - Comment as needed  Activities of Daily Living Home Assistive Devices/Equipment: CBG Meter ADL Screening (condition at time of admission) Patient's cognitive ability adequate to safely complete daily activities?: Yes Is the patient deaf or have difficulty hearing?: No Does the patient have difficulty seeing, even when wearing glasses/contacts?: No Does the patient have difficulty concentrating, remembering, or making decisions?: No Patient able to express need for assistance with ADLs?: Yes Does the patient have difficulty dressing or bathing?: No Independently performs ADLs?: Yes  (appropriate for developmental age) Does the patient have difficulty walking or climbing stairs?: No Weakness of Legs: None Weakness of Arms/Hands: None  Permission Sought/Granted Permission sought to share information with : Case Manager, Family Supports Permission granted to share information with : Yes, Verbal Permission Granted              Emotional Assessment Appearance:: Appears stated age Attitude/Demeanor/Rapport: Unable to Assess (Intubated) Affect (typically observed): Unable to Assess (Intubated) Orientation: : Oriented to Self Alcohol / Substance Use: Not Applicable Psych Involvement: No (comment)  Admission diagnosis:  GI bleed [K92.2] Lower GI bleed [K92.2] Patient Active Problem List   Diagnosis Date Noted   Anemia due to blood loss 09/18/2021   History of lower GI bleeding: 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  09/18/2021   H/O ischemic bowel disease 09/18/2021   Acute emphysematous cholecystitis 09/18/2021   Chronic respiratory failure with hypoxia (HCC)    Tracheostomy status (HCC)    Acute encephalopathy    Acute CVA (cerebrovascular accident) (HCC) 04/28/2021   Cannot sleep 08/26/2015   Chronic right-sided low back pain with sciatica 07/10/2015   Uncontrolled type 2 diabetes mellitus with hyperglycemia, without long-term current use of insulin (HCC) 06/19/2015   Hyperlipidemia 11/29/2013   Essential hypertension 11/28/2013   PCP:  Benita Stabile, MD Pharmacy:   [id number] - [place], [place] - [address] [place] [address] Phone:  [phone] Fax: [phone]     Social Determinants of Health (SDOH) Interventions    Readmission Risk Interventions     No data to display

## 2021-09-19 NOTE — Progress Notes (Signed)
Physical Therapy Treatment Patient Details Name: Troy Randall MRN: [id number] DOB: [birth year] Today's Date: 09/19/2021   History of Present Illness Patient is a 61 y/o male who presents on 09/04/21 with dark stools, dizziness and SOB. Found to have GI bleed and acute anemia s/p embolization 09/06/21 and percutaneous cholecystostomy 09/07/21. Intubated 9/3, CRRT 9/5-9/8. Hemorrhagic and septic shock 9/5. Trach placed 9/11. PMH includes CVA, DM, HTN.    PT Comments    Pt slowly progressing, limited by low focus and awareness.  Emphasis on transition, sitting balance at EOB, working on truncal activation, drawing attention/awareness to the L and observing for reaction to external stimuli.    Recommendations for follow up therapy are one component of a multi-disciplinary discharge planning process, led by the attending physician.  Recommendations may be updated based on patient status, additional functional criteria and insurance authorization.  Follow Up Recommendations  PT at Long-term acute care hospital     Assistance Recommended at Discharge Frequent or constant Supervision/Assistance  Patient can return home with the following Two people to help with walking and/or transfers;Two people to help with bathing/dressing/bathroom;Assistance with cooking/housework;Assist for transportation;Help with stairs or ramp for entrance   Equipment Recommendations  Other (comment) (TBD)    Recommendations for Other Services       Precautions / Restrictions Precautions Precautions: Fall Precaution Comments: TC. ,mitts     Mobility  Bed Mobility Overal bed mobility: Needs Assistance Bed Mobility: Rolling, Sit to Supine Rolling: Total assist, +2 for physical assistance Sidelying to sit: Total assist, +2 for safety/equipment   Sit to supine: Total assist, +2 for physical assistance, Max assist   General bed mobility comments: cued for direction.  Pt did not participate purposefully throughout  the activity    Transfers Overall transfer level: Needs assistance Equipment used: 2 person hand held assist Transfers: Sit to/from Stand Sit to Stand: Total assist, +2 safety/equipment, +2 physical assistance (x2)           General transfer comment: multimodal cues for direction.  significant assist with minimal response from patient in standing assist.    Ambulation/Gait                   Stairs             Wheelchair Mobility    Modified Rankin (Stroke Patients Only)       Balance Overall balance assessment: Needs assistance Sitting-balance support: Single extremity supported, Bilateral upper extremity supported, Feet supported Sitting balance-Leahy Scale: Poor Sitting balance - Comments: Worked at EOB >12 min working on general truncal activation, midline sitting/balance, attending to general environment and toward the left specifically.                                    Cognition Arousal/Alertness: Awake/alert Behavior During Therapy: Flat affect Overall Cognitive Status:  (NT formally, not consistently reacting purposefully to stimuli)                                          Exercises Other Exercises Other Exercises: hip/knee flex/ext ROM x10 reps bil with graded assist/resistance.    General Comments General comments (skin integrity, edema, etc.): vss overall      Pertinent Vitals/Pain Pain Assessment Pain Assessment: Faces Faces Pain Scale: No hurt Pain Intervention(s): Monitored during session  Home Living                          Prior Function            PT Goals (current goals can now be found in the care plan section) Acute Rehab PT Goals Patient Stated Goal: unable to state PT Goal Formulation: Patient unable to participate in goal setting Time For Goal Achievement: 09/30/21 Potential to Achieve Goals: Fair Progress towards PT goals: Progressing toward goals     Frequency    Min 2X/week      PT Plan Current plan remains appropriate    Co-evaluation              AM-PAC PT "6 Clicks" Mobility   Outcome Measure  Help needed turning from your back to your side while in a flat bed without using bedrails?: Total Help needed moving from lying on your back to sitting on the side of a flat bed without using bedrails?: Total Help needed moving to and from a bed to a chair (including a wheelchair)?: Total Help needed standing up from a chair using your arms (e.g., wheelchair or bedside chair)?: Total Help needed to walk in hospital room?: Total Help needed climbing 3-5 steps with a railing? : Total 6 Click Score: 6    End of Session Equipment Utilized During Treatment: Oxygen Activity Tolerance: Patient tolerated treatment well;Patient limited by fatigue Patient left: in bed;with call bell/phone within reach;with SCD's reapplied;with family/visitor present Nurse Communication: Mobility status PT Visit Diagnosis: Muscle weakness (generalized) (M62.81);Other symptoms and signs involving the nervous system (R29.898)     Time: 5188-4166 PT Time Calculation (min) (ACUTE ONLY): 31 min  Charges:  $Therapeutic Activity: 8-22 mins $Neuromuscular Re-education: 8-22 mins                     09/19/2021  Troy Randall., PT Acute Rehabilitation Services [phone]  (office)   Troy Randall 09/19/2021, 4:53 PM

## 2021-09-20 ENCOUNTER — Inpatient Hospital Stay (HOSPITAL_COMMUNITY): Payer: Medicare HMO

## 2021-09-20 DIAGNOSIS — K922 Gastrointestinal hemorrhage, unspecified: Secondary | ICD-10-CM | POA: Diagnosis not present

## 2021-09-20 DIAGNOSIS — G9389 Other specified disorders of brain: Secondary | ICD-10-CM | POA: Diagnosis not present

## 2021-09-20 LAB — HEPATIC FUNCTION PANEL
ALT: 48 U/L — ABNORMAL HIGH (ref 0–44)
AST: 25 U/L (ref 15–41)
Albumin: 2.2 g/dL — ABNORMAL LOW (ref 3.5–5.0)
Alkaline Phosphatase: 109 U/L (ref 38–126)
Bilirubin, Direct: 0.2 mg/dL (ref 0.0–0.2)
Indirect Bilirubin: 0.6 mg/dL (ref 0.3–0.9)
Total Bilirubin: 0.8 mg/dL (ref 0.3–1.2)
Total Protein: 6.1 g/dL — ABNORMAL LOW (ref 6.5–8.1)

## 2021-09-20 LAB — GLUCOSE, CAPILLARY
Glucose-Capillary: 236 mg/dL — ABNORMAL HIGH (ref 70–99)
Glucose-Capillary: 252 mg/dL — ABNORMAL HIGH (ref 70–99)
Glucose-Capillary: 297 mg/dL — ABNORMAL HIGH (ref 70–99)
Glucose-Capillary: 274 mg/dL — ABNORMAL HIGH (ref 70–99)
Glucose-Capillary: 184 mg/dL — ABNORMAL HIGH (ref 70–99)
Glucose-Capillary: 175 mg/dL — ABNORMAL HIGH (ref 70–99)

## 2021-09-20 LAB — AMMONIA: Ammonia: 24 umol/L (ref 9–35)

## 2021-09-20 MED ORDER — INSULIN GLARGINE-YFGN 100 UNIT/ML ~~LOC~~ SOLN
30.0000 [IU] | Freq: Every day | SUBCUTANEOUS | Status: DC
  Filled 2021-09-20: qty 0.3

## 2021-09-20 MED ORDER — INSULIN GLARGINE-YFGN 100 UNIT/ML ~~LOC~~ SOLN
10.0000 [IU] | Freq: Once | SUBCUTANEOUS | Status: AC
  Administered 2021-09-20: 10 [IU] via SUBCUTANEOUS
  Filled 2021-09-20: qty 0.1

## 2021-09-20 MED ORDER — DEXMEDETOMIDINE HCL IN NACL 400 MCG/100ML IV SOLN
0.4000 ug/kg/h | INTRAVENOUS | Status: DC
  Administered 2021-09-20: 0.4 ug/kg/h via INTRAVENOUS
  Administered 2021-09-20: 0.8 ug/kg/h via INTRAVENOUS
  Administered 2021-09-21: 0.4 ug/kg/h via INTRAVENOUS
  Administered 2021-09-22: 0.6 ug/kg/h via INTRAVENOUS
  Administered 2021-09-23: 0.8 ug/kg/h via INTRAVENOUS
  Administered 2021-09-23: 1.2 ug/kg/h via INTRAVENOUS
  Filled 2021-09-20 (×7): qty 100

## 2021-09-20 MED ORDER — INSULIN ASPART 100 UNIT/ML IJ SOLN
5.0000 [IU] | INTRAMUSCULAR | Status: DC
  Administered 2021-09-20 – 2021-09-21 (×6): 5 [IU] via SUBCUTANEOUS

## 2021-09-20 NOTE — Plan of Care (Signed)

## 2021-09-20 NOTE — Progress Notes (Signed)
SLP Cancellation Note  Patient Details Name: Troy Randall MRN: [id number] DOB: 07/20/1960   Cancelled treatment:       Reason Eval/Treat Not Completed: Patient at procedure or test/unavailable (off the floor for MRI). Will f/u as able.   Osie Bond., M.A. [place] Office [phone]  Secure chat preferred  09/20/2021, 2:25 PM

## 2021-09-20 NOTE — Progress Notes (Signed)
ICU Pharmacy Insulin Protocol   CBGs above goal 140-180 mg/dL: No > 180    Current regimen (include units of SSI in last 24hr): on resistant SSI (28 units in last 24 hours), lantus 20 daily + Novolog 3 Q4H   Medications affecting CBG levels: Tube feeds restarted, currently running at goal (60 mL/hr)    Plan: Increase tube feed coverage to 5 units Q4H.  Increase lantus to 30 units once daily.  To note, he did not receive a few doses of TF coverage yesterday as they were held for procedure.

## 2021-09-20 NOTE — Progress Notes (Signed)
NAME:  Troy Randall, MRN:  [id number], DOB:  July 19, 1960, LOS: 16 ADMISSION DATE:  09/04/2021, CONSULTATION DATE:  09/06/21 REFERRING MD:  [name], CHIEF COMPLAINT:  melena   History of Present Illness:   Troy Randall is an 61 y.o. male with prior history of HTN, HLD, DM, and CVA on ASA/ plavix who presented to [facility] on 8/31 after developing frequent 6-7 dark stools and feeling dizzy and lightheaded on standing.  No hx of NSAIDs.  FOBT positive and initial Hgb 12.2.    Patient admitted to [facility] with GI consulting.  Remained orthostatic with progressive decline in Hgb.  CTA abdomen did not show any active bleed, therefore underwent colonoscopy > no active bleed found but large clots throughout entire colon.  Then underwent EGD which showed a small hiatal hernia, erosive gastropathy with no stigmata of recent bleeding, and non-bleeding duodenal diverticulum.   Underwent then small bowel enteroscopy which noted intermittent blood pumping in the proximal jejunum but was unable to be reached despite multiple attempts, was injected and clip placed.  Repeat CTA repeated, which showed positive jejunal diverticular bleeding. Patient was transferred to [facility] for IR involvement. In IR active extravasation  at [facility] territory, jejunal arcade branch, successful coil embolization.  PCCM was consulted for transfer and admission.  Pertinent  Medical History  HTN, HLD, DM, CVA on ASA/ plavix  Significant Hospital Events: Including procedures, antibiotic start and stop dates in addition to other pertinent events   8/31 presented to [facility], [name] Admit, Gi Consult 9/1 CTA> no evidence of active GI bleed, 2 U PRBC 9/2 2 U PRBC, colonoscopy > no active bleed found but large clots throughout entire colon. EGD> small hiatal hernia, erosive gastropathy with no stigmata of recent bleeding, and non-bleeding duodenal diverticulum.   Underwent then small bowel enteroscopy which noted intermittent blood pumping in the proximal jejunum  but was unable to be reached despite multiple attempts, was injected and clip placed. Repeat CTA repeated, which showed positive jejunal diverticular bleeding. IR> active extrav at [facility] territory, jejunal arcade branch, successful coil embo 9/3 Intubated. CTA> no evidence of active GI bleeding.  New finding of abnormal gallbladder with nondependent air, gallbladder wall air, pericholecystic and right upper quadrant information.  IR placed percutaneous cholecystotomy tube.  9/4 R. IJ central line placed with dialysis triple lumen catheter 9/5 CRRT initiated 9/6 Bedside EGD without any evidence of bleeding. CTA a/p without etiology for bleed 9/8 CRRT discontinued. Required ETT exchange yesterday due to blown cuff. 9/11 Bronchoscopic guided percutaneous tracheostomy placement.  Right IJ central line removed. 9/15 flexible sigmoidoscopy with removal of endoscopy Interim History / Subjective:   Yesterday patient had flexible sigmoidoscopy with removal of his endoscopy capsule.  Per patient's wife he is still having movements of his upper and lower extremities but not following commands.  Objective   Blood pressure (!) 155/100, pulse (!) 109, temperature 98.2 F (36.8 C), temperature source Axillary, resp. rate (!) 23, height 6' (1.829 m), weight 125.6 kg, SpO2 98 %. FiO2 (%):  [35 %] 35 %   Intake/Output Summary (Last 24 hours) at 09/20/2021 0647 Last data filed at 09/20/2021 0600 Gross per 24 hour  Intake 1174 ml  Output 2000 ml  Net -826 ml    Filed Weights   09/18/21 0500 09/19/21 0500 09/20/21 0500  Weight: 130.7 kg 128.5 kg 125.6 kg   Physical Exam: General: Chronically ill-appearing, no acute distress HENT: [place], AT, MV via tracheostomy Eyes: EOMI, no scleral icterus.  Respiratory: Clear to auscultation bilaterally.  No crackles, wheezing or rales Cardiovascular: RRR, -M/R/G, no JVD GI: BS+, soft, non-tender. Percutaneous chole drain in place.  Extremities: No edema present Neuro:  Following commands this morning, moving extremities GU: External catheter  Resolved Hospital Problem list   AKI - off CRRT Septic shock Assessment & Plan:   Acute encephalopathy, possibly secondary to hypoxic-ischemic encephalopathy History of CVA without residual deficits, concern for recurrence He has remained encephalopathic despite no further sedating medications and resolution of his sepsis.  EEG negative for any seizure-like activity but with evidence of encephalopathy.  Awaiting MRI to assess for ischemic disease.  No metabolic derangements and hepatic function panel normal.  No history of significant alcohol use to think this is Wernicke's. -Pending MRI of brain -Monitor mental status and avoid sedating medications if possible -Continue aspirin, plavix and crestor  Acute hypoxemic respiratory failure s/p percutaneous tracheostomy 9/11 Doing well off of ventilator.  -Minimize sedation  Hemorrhagic shock - resolved ABLA secondary to lower GI bleed, jejunal diverticular and ulcers secondary to ischemia Acute blood loss and iron deficiency anemia, secondary to above - 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 and bedside EGD 9/6 No further episodes of melena.  Endoscopy capsule removed yesterday by Dr. Benson Norway -Daily CBC -Will need iron repletion outpatient -Transfuse PRBC if HBG less than 7  Ecoli Cholecystitis s/p percutaneous cholecystostomy drain placement 9/3 percutaneous cholecystostomy drain placed and continues to have adequate drainage. -Completed total 7d of antibiotics: Ceftriaxone  -Continue to monitor drain -Trend fever/WBC -Will need outpatient surgery and IR follow up  AKI  - off CRRT 9/8 Bilateral nonobstructing nephrolithiasis - suspect chronic BPH -Labs every Monday Wednesday Friday -Nephrology no longer following -continue lasix 40 mg PO daily -Trend electrolytes, replete as needed -Strict I/O  Thrombocytopenia - normalized  DM2 Continue  tube feeds -Blood Glucose goal 140-180 -Adjust as needed to meet goal above  HTN HLD -Amlodipine 10 mg daily -Hold home lisinopril  -Continue crestor   Pulmonary nodules in bilateral bases, L 81mm Family reports patient is a never smoker. Family does endorse dipping. -no further follow up needed for a low risk patient based on fleischner criteria  Best Practice (right click and "Reselect all SmartList Selections" daily)   Diet/type: TF DVT prophylaxis: prophylactic heparin  GI prophylaxis: PPI Lines: Foley, PICC RUE Foley:  yes Code Status:  full code Last date of multidisciplinary goals of care discussion [Full scope- Wife updated at bedside this am  [place]  Internal Medicine Resident PGY-3 [place]  Pager: [phone]

## 2021-09-21 ENCOUNTER — Encounter (HOSPITAL_COMMUNITY): Payer: Self-pay | Admitting: Gastroenterology

## 2021-09-21 ENCOUNTER — Inpatient Hospital Stay (HOSPITAL_COMMUNITY): Payer: Medicare HMO

## 2021-09-21 DIAGNOSIS — G379 Demyelinating disease of central nervous system, unspecified: Secondary | ICD-10-CM | POA: Diagnosis not present

## 2021-09-21 DIAGNOSIS — K922 Gastrointestinal hemorrhage, unspecified: Secondary | ICD-10-CM | POA: Diagnosis not present

## 2021-09-21 DIAGNOSIS — G9389 Other specified disorders of brain: Secondary | ICD-10-CM | POA: Diagnosis not present

## 2021-09-21 LAB — CBC WITH DIFFERENTIAL/PLATELET
Abs Immature Granulocytes: 0.03 10*3/uL (ref 0.00–0.07)
Basophils Absolute: 0.1 10*3/uL (ref 0.0–0.1)
Basophils Relative: 1 %
Eosinophils Absolute: 0.2 10*3/uL (ref 0.0–0.5)
Eosinophils Relative: 2 %
HCT: 35.7 % — ABNORMAL LOW (ref 39.0–52.0)
Hemoglobin: 11.2 g/dL — ABNORMAL LOW (ref 13.0–17.0)
Immature Granulocytes: 0 %
Lymphocytes Relative: 8 %
Lymphs Abs: 0.8 10*3/uL (ref 0.7–4.0)
MCH: 28.6 pg (ref 26.0–34.0)
MCHC: 31.4 g/dL (ref 30.0–36.0)
MCV: 91.1 fL (ref 80.0–100.0)
Monocytes Absolute: 0.7 10*3/uL (ref 0.1–1.0)
Monocytes Relative: 7 %
Neutro Abs: 8.2 10*3/uL — ABNORMAL HIGH (ref 1.7–7.7)
Neutrophils Relative %: 82 %
Platelets: 366 10*3/uL (ref 150–400)
RBC: 3.92 MIL/uL — ABNORMAL LOW (ref 4.22–5.81)
RDW: 14.9 % (ref 11.5–15.5)
WBC: 10 10*3/uL (ref 4.0–10.5)
nRBC: 0 % (ref 0.0–0.2)

## 2021-09-21 LAB — GLUCOSE, CAPILLARY
Glucose-Capillary: 222 mg/dL — ABNORMAL HIGH (ref 70–99)
Glucose-Capillary: 281 mg/dL — ABNORMAL HIGH (ref 70–99)
Glucose-Capillary: 343 mg/dL — ABNORMAL HIGH (ref 70–99)
Glucose-Capillary: 218 mg/dL — ABNORMAL HIGH (ref 70–99)
Glucose-Capillary: 97 mg/dL (ref 70–99)
Glucose-Capillary: 161 mg/dL — ABNORMAL HIGH (ref 70–99)

## 2021-09-21 LAB — BASIC METABOLIC PANEL
Anion gap: 8 (ref 5–15)
BUN: 24 mg/dL — ABNORMAL HIGH (ref 6–20)
CO2: 25 mmol/L (ref 22–32)
Calcium: 8.6 mg/dL — ABNORMAL LOW (ref 8.9–10.3)
Chloride: 114 mmol/L — ABNORMAL HIGH (ref 98–111)
Creatinine, Ser: 0.83 mg/dL (ref 0.61–1.24)
GFR, Estimated: >60 mL/min (ref >60)
Glucose, Bld: 323 mg/dL — ABNORMAL HIGH (ref 70–99)
Potassium: 4.2 mmol/L (ref 3.5–5.1)
Sodium: 147 mmol/L — ABNORMAL HIGH (ref 135–145)

## 2021-09-21 LAB — MAGNESIUM: Magnesium: 2.2 mg/dL (ref 1.7–2.4)

## 2021-09-21 LAB — PHOSPHORUS: Phosphorus: 3.8 mg/dL (ref 2.5–4.6)

## 2021-09-21 MED ORDER — SODIUM CHLORIDE 0.9 % IV SOLN
250.0000 mL | INTRAVENOUS | Status: DC
  Administered 2021-10-03 – 2021-10-11 (×2): 250 mL via INTRAVENOUS

## 2021-09-21 MED ORDER — INSULIN GLARGINE-YFGN 100 UNIT/ML ~~LOC~~ SOLN
40.0000 [IU] | Freq: Every day | SUBCUTANEOUS | Status: DC
  Administered 2021-09-21 – 2021-09-23 (×3): 40 [IU] via SUBCUTANEOUS
  Filled 2021-09-21 (×3): qty 0.4

## 2021-09-21 MED ORDER — MIDAZOLAM HCL 2 MG/2ML IJ SOLN
2.0000 mg | Freq: Once | INTRAMUSCULAR | Status: AC
  Administered 2021-09-21: 2 mg via INTRAVENOUS
  Filled 2021-09-21: qty 2

## 2021-09-21 MED ORDER — PHENYLEPHRINE 80 MCG/ML (10ML) SYRINGE FOR IV PUSH (FOR BLOOD PRESSURE SUPPORT)
PREFILLED_SYRINGE | INTRAVENOUS | Status: AC
  Filled 2021-09-21: qty 10

## 2021-09-21 MED ORDER — TRAZODONE HCL 50 MG PO TABS
150.0000 mg | ORAL_TABLET | Freq: Once | ORAL | Status: AC
  Administered 2021-09-21: 150 mg
  Filled 2021-09-21: qty 3

## 2021-09-21 MED ORDER — ACETAMINOPHEN 325 MG PO TABS
650.0000 mg | ORAL_TABLET | Freq: Four times a day (QID) | ORAL | Status: DC | PRN
  Administered 2021-09-21 – 2021-10-02 (×5): 650 mg
  Filled 2021-09-21 (×6): qty 2

## 2021-09-21 MED ORDER — PHENYLEPHRINE HCL-NACL 20-0.9 MG/250ML-% IV SOLN
25.0000 ug/min | INTRAVENOUS | Status: DC
  Administered 2021-09-21: 25 ug/min via INTRAVENOUS
  Filled 2021-09-21: qty 250

## 2021-09-21 MED ORDER — NOREPINEPHRINE 4 MG/250ML-% IV SOLN
INTRAVENOUS | Status: AC
  Filled 2021-09-21: qty 250

## 2021-09-21 MED ORDER — GADOPICLENOL 0.5 MMOL/ML IV SOLN
10.0000 mL | Freq: Once | INTRAVENOUS | Status: AC | PRN
  Administered 2021-09-21: 10 mL via INTRAVENOUS

## 2021-09-21 MED ORDER — INSULIN ASPART 100 UNIT/ML IJ SOLN
8.0000 [IU] | INTRAMUSCULAR | Status: DC
  Administered 2021-09-21 – 2021-09-22 (×6): 8 [IU] via SUBCUTANEOUS

## 2021-09-21 NOTE — Progress Notes (Signed)
SLP Cancellation Note  Patient Details Name: Troy Randall MRN: [id number] DOB: 02-09-1960   Cancelled treatment:       Reason Eval/Treat Not Completed: Patient's level of consciousness Per discussion with RN, pt remains restless and unable to participate in PMV evaluation/clinical swallow evaluation. SLP service to follow up as pt becomes appropriate.  Hudsyn Champine P. Kimari Coudriet, M.S., CCC-SLP Speech-Language Pathologist Acute Rehabilitation Services Pager: [email or web address] 09/21/2021, 11:06 AM

## 2021-09-21 NOTE — Progress Notes (Deleted)
NAME:  Troy Randall, MRN:  [id number], DOB:  18-Mar-1960, LOS: 29 ADMISSION DATE:  09/04/2021, CONSULTATION DATE:  09/06/21 REFERRING MD:  [name], CHIEF COMPLAINT:  melena   History of Present Illness:   Troy Randall is an 61 y.o. male with prior history of HTN, HLD, DM, and CVA on ASA/ plavix who presented to [facility] on 8/31 after developing frequent 6-7 dark stools and feeling dizzy and lightheaded on standing.  No hx of NSAIDs.  FOBT positive and initial Hgb 12.2.    Patient admitted to [facility] with GI consulting.  Remained orthostatic with progressive decline in Hgb.  CTA abdomen did not show any active bleed, therefore underwent colonoscopy > no active bleed found but large clots throughout entire colon.  Then underwent EGD which showed a small hiatal hernia, erosive gastropathy with no stigmata of recent bleeding, and non-bleeding duodenal diverticulum.   Underwent then small bowel enteroscopy which noted intermittent blood pumping in the proximal jejunum but was unable to be reached despite multiple attempts, was injected and clip placed.  Repeat CTA repeated, which showed positive jejunal diverticular bleeding. Patient was transferred to [facility] for IR involvement. In IR active extravasation  at [facility] territory, jejunal arcade branch, successful coil embolization.  PCCM was consulted for transfer and admission.  Pertinent  Medical History  HTN, HLD, DM, CVA on ASA/ plavix  Significant Hospital Events: Including procedures, antibiotic start and stop dates in addition to other pertinent events   8/31 presented to [facility] ED, [name] Admit, Gi Consult 9/1 CTA> no evidence of active GI bleed, 2 U PRBC 9/2 2 U PRBC, colonoscopy > no active bleed found but large clots throughout entire colon. EGD> small hiatal hernia, erosive gastropathy with no stigmata of recent bleeding, and non-bleeding duodenal diverticulum.   Underwent then small bowel enteroscopy which noted intermittent blood pumping in the proximal jejunum  but was unable to be reached despite multiple attempts, was injected and clip placed. Repeat CTA repeated, which showed positive jejunal diverticular bleeding. IR> active extrav at [facility] territory, jejunal arcade branch, successful coil embo 9/3 Intubated. CTA> no evidence of active GI bleeding.  New finding of abnormal gallbladder with nondependent air, gallbladder wall air, pericholecystic and right upper quadrant information.  IR placed percutaneous cholecystotomy tube.  9/4 R. IJ central line placed with dialysis triple lumen catheter 9/5 CRRT initiated 9/6 Bedside EGD without any evidence of bleeding. CTA a/p without etiology for bleed 9/8 CRRT discontinued. Required ETT exchange yesterday due to blown cuff. 9/11 Bronchoscopic guided percutaneous tracheostomy placement.  Right IJ central line removed. 9/15 flexible sigmoidoscopy with removal of endoscopy Interim History / Subjective:   Continues to have nonpurposeful movements.  Agitated overnight requiring one-time dose of trazodone. per wife is following some commands however does not follow them with me.  MRI without contrast yesterday with numerous round white matter lesions in both cerebral hemispheres compared with MRI 04/28/2021.  Objective   Blood pressure 129/76, pulse 97, temperature 98.8 F (37.1 C), temperature source Axillary, resp. rate (!) 23, height 6' (1.829 m), weight 122.1 kg, SpO2 98 %. FiO2 (%):  [35 %] 35 %   Intake/Output Summary (Last 24 hours) at 09/21/2021 0901 Last data filed at 09/21/2021 0600 Gross per 24 hour  Intake 1220.81 ml  Output 1100 ml  Net 120.81 ml    Filed Weights   09/19/21 0500 09/20/21 0500 09/21/21 0500  Weight: 128.5 kg 125.6 kg 122.1 kg   Physical Exam: General: Chronically ill-appearing, no  acute distress HENT: [place], AT, MV via tracheostomy Eyes: No extraocular motion, pupils equal round reactive. Respiratory: Clear to auscultation bilaterally.  No crackles, wheezing or  rales Cardiovascular: RRR, -M/R/G, no JVD GI: BS+, soft, non-tender. Percutaneous chole drain in place.  Extremities: No edema present Neuro: Not following commands, moving extremities sporadically.  Does not withdrawal to pain. GU: External catheter  Resolved Hospital Problem list   AKI - off CRRT Septic shock Assessment & Plan:   Acute encephalopathy, possibly secondary to hypoxic-ischemic encephalopathy History of CVA without residual deficits, concern for recurrence Remained encephalopathic, required 1 dose trazodone yesterday evening for frequent movements and agitation.  Hepatic function panel and ammonia levels normal.  Continues to not follow commands or track and has sporadic movements of his extremities.  He is not withdrawing to pain.  MRI concerning for recurrent infarcts or demyelinating disease per radiology read, that are progressing and new since prior MRI in April 2023.  Neurology consulted appreciate their assistance.  Low suspicion for cardioembolic etiology as he remained afebrile and without leukocytosis.  TTE on 9/5 without evidence of vegetations. -Neurology consult placed -Monitor mental status and avoid sedating medications if possible -Continue aspirin, plavix and crestor -Will repeat labs this morning in anticipation of MRI w/ contrast  Acute hypoxemic respiratory failure s/p percutaneous tracheostomy 9/11 Continues to do well on trach collar. -Minimize sedation  Hemorrhagic shock - resolved ABLA secondary to lower GI bleed, jejunal diverticular and ulcers secondary to ischemia Acute blood loss and iron deficiency anemia, secondary to above - 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 and bedside EGD 9/6. Endoscopy capsule removal 9/15 No further melena. Hgb stable.  -Daily CBC -Transfuse PRBC if HBG less than 7  Ecoli Cholecystitis s/p percutaneous cholecystostomy drain placement 9/3 percutaneous cholecystostomy drain placed and continues to  have adequate drainage. -Completed total 7d of antibiotics: Ceftriaxone  -Continue to monitor drain -Trend fever/WBC -Will need outpatient surgery and IR follow up  AKI  - off CRRT 9/8 Bilateral nonobstructing nephrolithiasis - suspect chronic BPH BMP today in anticipation of MRI w/ contrast. Renal function has normalized. -Labs every Monday Wednesday Friday -Nephrology no longer following -continue lasix 40 mg PO daily -Trend electrolytes, replete as needed -Strict I/O  DM2 Current regimen of SSI, novolog 5 u q4h, and 30 U glargine. Will adjust glargine to 40 and novolog to 8.  -Blood Glucose goal 140-180 -Adjust as needed to meet goal above  HTN HLD -Amlodipine 10 mg daily -Hold home lisinopril  -Continue crestor   Pulmonary nodules in bilateral bases, L 2mm Family reports patient is a never smoker. Family does endorse dipping. -no further follow up needed for a low risk patient based on fleischner criteria  Best Practice (right click and "Reselect all SmartList Selections" daily)   Diet/type: TF DVT prophylaxis: prophylactic heparin  GI prophylaxis: PPI Lines: Foley, PICC RUE Foley:  yes Code Status:  full code Last date of multidisciplinary goals of care discussion [Full scope- Wife updated at bedside this morning  [place]  Internal Medicine Resident PGY-3 [place]  Pager: [phone]

## 2021-09-21 NOTE — Consult Note (Signed)
Neurology Consultation  Reason for Consult: Altered mental status and white matter changes on MRI Referring Physician: Dr. Erin Fulling  CC: none  History is obtained from:spouse and chart  HPI: Troy Randall is a 61 y.o. male with history of stroke in 4/23, HTN, HLD and DM with recent severe jejunal bleed s/p vessel coiling who is being seen for altered mental status and concerning white matter changes on MRI.  Patient's wife reports that after his stroke in 4/23 he made a good recovery and was able to perform ADLs as well as activities like mowing the lawn.  He had some residual dysarthria.  He in initially presented to [facility] on 8/31 with GI bleeding and was found to have a massive jejunal bleed ultimately requiring 20 units of blood. Vessel was coiled.  Hospitalization complicated also by cholecystitis s/p perc cholecystotomy tube placement. Patient has remained persistently altered and underwent trach placement. He able to occasionally nod in answer to questions and has been intermittently following commands.  He has been restless with near constant nonpurposeful movement of extremities. Neurology consulted bc MRI brain performed today 2/2 persistent AMS showed numerous white matter lesions new since last MRI in April.   ROS: Unable to obtain due to altered mental status.   Past Medical History:  Diagnosis Date   Anemia    Arthritis    CVA (cerebral vascular accident) ([place])    Diabetes mellitus without complication ([place])    Hyperlipidemia    Hypertension    Kidney stones      Family History  Problem Relation Age of Onset   Heart failure Mother    Diabetes Father    Cancer Other    Heart attack Other    Anesthesia problems Neg Hx    Hypotension Neg Hx    Malignant hyperthermia Neg Hx    Pseudochol deficiency Neg Hx    Colon cancer Neg Hx    Inflammatory bowel disease Neg Hx      Social History:   reports that he has never smoked. His smokeless tobacco use  includes snuff. He reports that he does not drink alcohol and does not use drugs.  Medications  Current Facility-Administered Medications:    0.9 %  sodium chloride infusion, , Intravenous, PRN, Spero Geralds, MD, Stopped at 09/15/21 1519   amLODipine (NORVASC) tablet 10 mg, 10 mg, Per Tube, Daily, Katsadouros, Vasilios, MD, 10 mg at 09/20/21 0913   aspirin chewable tablet 81 mg, 81 mg, Per Tube, Daily, Katsadouros, Vasilios, MD, 81 mg at 09/20/21 0913   Chlorhexidine Gluconate Cloth 2 % PADS 6 each, 6 each, Topical, Daily, Kathie Dike, MD, 6 each at 09/20/21 2130   clopidogrel (PLAVIX) tablet 75 mg, 75 mg, Per Tube, Daily, Erick Colace, NP, 75 mg at 09/20/21 0913   dexmedetomidine (PRECEDEX) 400 MCG/100ML (4 mcg/mL) infusion, 0.4-1.2 mcg/kg/hr, Intravenous, Titrated, Katsadouros, Vasilios, MD, Stopped at 09/20/21 1525   dextrose 10 % infusion, , Intravenous, Continuous PRN, Priscella Mann, RPH   docusate (COLACE) 50 MG/5ML liquid 100 mg, 100 mg, Per Tube, Daily, Margaretha Seeds, MD, 100 mg at 09/20/21 0913   feeding supplement (PROSource TF20) liquid 60 mL, 60 mL, Per Tube, BID, Margaretha Seeds, MD, 60 mL at 09/20/21 2108   feeding supplement (VITAL 1.5 CAL) liquid 1,000 mL, 1,000 mL, Per Tube, Continuous, Margaretha Seeds, MD, Last Rate: 60 mL/hr at 09/21/21 0600, Infusion Verify at 09/21/21 0600   furosemide (LASIX) tablet 40 mg,  40 mg, Per Tube, Daily, Katsadouros, Vasilios, MD, 40 mg at 09/20/21 0913   heparin injection 5,000 Units, 5,000 Units, Subcutaneous, Q8H, Candee Furbish, MD, 5,000 Units at 09/21/21 0617   insulin aspart (novoLOG) injection 0-20 Units, 0-20 Units, Subcutaneous, Q4H, Ventura Sellers, RPH, 7 Units at 09/21/21 0417   insulin aspart (novoLOG) injection 8 Units, 8 Units, Subcutaneous, Q4H, Bell, Lorin C, RPH   insulin glargine-yfgn (SEMGLEE) injection 40 Units, 40 Units, Subcutaneous, Daily, Bell, Lorin C, RPH   ondansetron (ZOFRAN) tablet 4 mg, 4 mg,  Oral, Q6H PRN **OR** ondansetron (ZOFRAN) injection 4 mg, 4 mg, Intravenous, Q6H PRN, Kathie Dike, MD   Oral care mouth rinse, 15 mL, Mouth Rinse, PRN, Kathie Dike, MD   Oral care mouth rinse, 15 mL, Mouth Rinse, Q2H, Spero Geralds, MD, 15 mL at 09/21/21 [id number]   Oral care mouth rinse, 15 mL, Mouth Rinse, PRN, Spero Geralds, MD   pantoprazole (PROTONIX) 2 mg/mL oral suspension 40 mg, 40 mg, Per Tube, Daily, Ventura Sellers, RPH, 40 mg at 09/20/21 0913   polyethylene glycol (MIRALAX / GLYCOLAX) packet 17 g, 17 g, Per Tube, Daily, Margaretha Seeds, MD, 17 g at 09/20/21 0913   rosuvastatin (CRESTOR) tablet 40 mg, 40 mg, Per Tube, Daily, Erick Colace, NP, 40 mg at 09/20/21 0913   sodium chloride flush (NS) 0.9 % injection 10-40 mL, 10-40 mL, Intracatheter, Q12H, Spero Geralds, MD, 10 mL at 09/20/21 2108   sodium chloride flush (NS) 0.9 % injection 10-40 mL, 10-40 mL, Intracatheter, PRN, Spero Geralds, MD, 10 mL at 09/14/21 2300   sodium chloride flush (NS) 0.9 % injection 5 mL, 5 mL, Intracatheter, Q8H, Sandi Mariscal, MD, 5 mL at 09/21/21 [id number]   Zinc Oxide (TRIPLE PASTE) 12.8 % ointment, , Topical, BID, Margaretha Seeds, MD, 1 Application at [id number]   Exam: Current vital signs: BP (!) 140/88   Pulse (!) 113   Temp 98.8 F (37.1 C) (Axillary)   Resp (!) 24   Ht 6' (1.829 m)   Wt 122.1 kg   SpO2 95%   BMI 36.51 kg/m  Vital signs in last 24 hours: Temp:  [97.9 F (36.6 C)-98.8 F (37.1 C)] 98.8 F (37.1 C) (09/17 0400) Pulse Rate:  [68-120] 113 (09/17 0904) Resp:  [17-28] 24 (09/17 0904) BP: (82-146)/(58-104) 140/88 (09/17 0904) SpO2:  [93 %-100 %] 95 % (09/17 0904) FiO2 (%):  [35 %] 35 % (09/17 0904) Weight:  [122.1 kg] 122.1 kg (09/17 0500)  GENERAL: Awake, alert, restless Head: Normocephalic and atraumatic, without obvious abnormality, tracheostomy in place EENT: Normal conjunctivae, dry mucous membranes, no OP obstruction LUNGS: Normal respiratory  effort. Non-labored breathing on trach collar CV: Regular rate and rhythm on telemetry Extremities: warm, well perfused, without obvious deformity  NEURO:  Mental Status: Awake, alert, responds to voice but orientation is difficult to judge due to inability to speak.  Will intermittently follow one step commands. Cranial Nerves:  II: PERRL3 mm/brisk.  III, IV, VI: EOMI.  VII: Face is symmetric resting  VIII: Hearing intact to voice  Motor: Moves all extremities nonpurposefully and sometimes purposefully per wife DTRs: 2+ throughout.  Gait: Deferred  Labs I have reviewed labs in epic and the results pertinent to this consultation are:   CBC    Component Value Date/Time   WBC 10.1 09/19/2021 0836   RBC 3.76 (L) 09/19/2021 0836   HGB 10.5 (L) 09/19/2021 0942   HCT 31.0 (  L) 09/19/2021 0942   PLT 450 (H) 09/19/2021 0836   MCV 92.0 09/19/2021 0836   MCH 28.5 09/19/2021 0836   MCHC 30.9 09/19/2021 0836   RDW 14.9 09/19/2021 0836   LYMPHSABS 0.7 09/19/2021 0836   MONOABS 0.7 09/19/2021 0836   EOSABS 0.2 09/19/2021 0836   BASOSABS 0.1 09/19/2021 0836    CMP     Component Value Date/Time   NA 146 (H) 09/19/2021 0942   K 3.8 09/19/2021 0942   CL 110 09/19/2021 0836   CO2 26 09/19/2021 0836   GLUCOSE 242 (H) 09/19/2021 0836   BUN 16 09/19/2021 0836   CREATININE 0.76 09/19/2021 0836   CREATININE 0.90 03/31/2017 1625   CALCIUM 8.4 (L) 09/19/2021 0836   PROT 6.1 (L) 09/20/2021 1443   ALBUMIN 2.2 (L) 09/20/2021 1443   AST 25 09/20/2021 1443   ALT 48 (H) 09/20/2021 1443   ALKPHOS 109 09/20/2021 1443   BILITOT 0.8 09/20/2021 1443   GFRNONAA >60 09/19/2021 0836   GFRAA >90 04/07/2011 0927    Lipid Panel     Component Value Date/Time   CHOL 117 04/29/2021 0502   TRIG 93 04/29/2021 0502   HDL 32 (L) 04/29/2021 0502   CHOLHDL 3.7 04/29/2021 0502   VLDL 19 04/29/2021 0502   LDLCALC 66 04/29/2021 0502   LDLCALC  03/31/2017 1625     Comment:     . LDL cholesterol not  calculated. Triglyceride levels greater than 400 mg/dL invalidate calculated LDL results. . Reference range: <100 . Desirable range <100 mg/dL for primary prevention;   <70 mg/dL for patients with CHD or diabetic patients  with > or = 2 CHD risk factors. Marland Kitchen LDL-C is now calculated using the Martin-Hopkins  calculation, which is a validated novel method providing  better accuracy than the Friedewald equation in the  estimation of LDL-C.  Cresenciano Genre et al. Annamaria Helling. WG:[id number]): 2061-2068  (http://education.[employer].com/faq/FAQ164)    LDLDIRECT 101.0 01/21/2016 1032     Imaging I have reviewed the images obtained:  CT-scan of the brain: Multiple white matter insult possibly representing infarcts or inflammatory process, chronic small vessel disease  MRI examination of the brain: Numerous round white matter lesions with restricted diffusion, possibly infarcts or demyelinating disease.  Assessment: 61 year old patient with history of stroke in 4/23 with minimal residual deficits, HTN, HLD, DM and recent jejunal blees presents with continued altered mental status and concerning white matter changes on MRI.  On exam, patient is able to intermittently follow simple commands and moves all extremities nonpurposefully and sometimes purposefully.  Likely has a delirium component to altered mental status given long hospitalization.  White matter changes on MRI may represent strokes, although pattern would be unusual.  May also be a sign of demyelinating disease.  Will need to investigate further with MRI with contrast and lumbar puncture  Impression:hyperactive delirium and multiple strokes vs. Demyelinating disease in patient with altered mental status  Recommendations: - MRI brain with contrast, consider using Precedex for sedation as patient is quite restless - Lumbar puncture, send CSF for cell count x2 tubes, protein, glucose, gram stain, culture, biofire meningitis PCR panel, IgG index,  oligoclonal bands - try to minimize deliriogenic medications as much as possible (J Am Geriatr Soc. 2012 Apr;60(4):616-31): benzodiazepines, anticholinergics, diphenhydramine, antihistamines, narcotics, Ambien/Lunesta/Sonata etc. - environmental support for delirium: Lights on during the day, patient up and out of bed as much as is feasible, OT/PT, quiet dimly lit room at night, reorient patient often, provide  hearing aides and glasses if patient uses them routinely, minimize sleep disruptions as much as possible overnight.  As much as possible, reorient patient, and have them engage patient in activities, e.g. playing cards. TV should be off or on neutral background music unless patient engaged and watching. Try to keep interactions with the patient calm and quiet.    - continue to look for and treat possible modifiable risk factors for delirium.  These include deliriogenic medications, infection (CBC/diff, UA, pan culture, CXR, and evaluation of the surgical site), metabolic derrangement (CMP, TSH/fT4), organ failure (uremia, hepatic profile, ABG), dehydration, malnutrition (check B1, B12; please replete thiamine), surgery/sedation, immobility/physical restraints, sensory impairments (vison/hearing), sleep deprivation, pain, and drug withdrawal or intoxication. - Please keep in mind that although delirium is a reversible cause of altered mental status, patients with delirium are prone to unpredictable behavior that make them a potential risk to their own safety as well as safety of others. One can consider low dose seroquel (12.5 to 25 mg nightly PRN) as needed while monitoring QTc to reduce aggressive behavior, uptitrating slowly as needed.   Pt seen by NP/Neuro and later by MD. Note/plan to be edited by MD as needed.  Meta , MSN, AGACNP-BC [facility] See Amion for schedule and pager information 09/21/2021 10:12 AM  Attending Neurologist's note:   I personally saw this  patient, gathering history, performing a full neurologic examination, reviewing relevant labs, personally reviewing relevant imaging, and formulated the assessment and plan, adding the note above for completeness and clarity to accurately reflect my findings and recommendations.   Imaging personally reviewed including repeat MRI brain w contrast tonight. There are >20 contrast enhancing lesions in the white matter bilaterally. Strongly favor inflammation / active demyelination possibly MS vs ADEM. Recommend LP with above studies; inflammation favored over infection given that he is afebrile and primarily altered but not ill-appearing therefore no indication for empiric CNS coverage at this time. He will also need MRI c and t spine wwo contrast (will not be able to get gad for next 24 hrs). Will consider IV solumedrol if no e/o infection on LP. Neurology will continue to follow.   This patient is critically ill and at significant risk of neurological worsening, death and care requires constant monitoring of vital signs, hemodynamics,respiratory and cardiac monitoring, neurological assessment, discussion with family, other specialists and medical decision making of high complexity. I spent 70 minutes of neurocritical care time  in the care of  this patient. This was time spent independent of any time provided by nurse practitioner or PA.  Su Monks, MD [facility] [phone]  If 7pm- 7am, please page neurology on call as listed in [place].

## 2021-09-21 NOTE — Progress Notes (Signed)
ICU Pharmacy Insulin Protocol   CBGs above goal 140-180 mg/dL: Yes > 180    Current regimen: on resistant SSI (24 units in last 24 hours), lantus 30 daily + Novolog 3 Q4H   Medications affecting CBG levels: Tube feeds restarted, currently running at goal (60 mL/hr)    Plan: Increase tube feed coverage to 8 units Q4H.  Increase lantus to 40 units once daily.   Erskine Speed, PharmD Clinical Pharmacist

## 2021-09-21 NOTE — Progress Notes (Signed)
eLink Physician-Brief Progress Note Patient Name: Troy Randall DOB: 04/06/1960 MRN: [id number]   Date of Service  09/21/2021  HPI/Events of Note  Patient is restless and anxious. Takes Trazodone Q HS at home.   eICU Interventions  Plan: Trazodone 150 mg per tube X 1.      Intervention Category Major Interventions: Other:  Lysle Dingwall 09/21/2021, 1:31 AM

## 2021-09-21 NOTE — Progress Notes (Addendum)
NAME:  Troy Randall, MRN:  [id number], DOB:  12-17-1960, LOS: 17 ADMISSION DATE:  09/04/2021, CONSULTATION DATE:  09/06/21 REFERRING MD:  [name], CHIEF COMPLAINT:  melena   History of Present Illness:   Troy Randall is an 61 y.o. male with prior history of HTN, HLD, DM, and CVA on ASA/ plavix who presented to [facility] on 8/31 after developing frequent 6-7 dark stools and feeling dizzy and lightheaded on standing.  No hx of NSAIDs.  FOBT positive and initial Hgb 12.2.    Patient admitted to [facility] with GI consulting.  Remained orthostatic with progressive decline in Hgb.  CTA abdomen did not show any active bleed, therefore underwent colonoscopy > no active bleed found but large clots throughout entire colon.  Then underwent EGD which showed a small hiatal hernia, erosive gastropathy with no stigmata of recent bleeding, and non-bleeding duodenal diverticulum.   Underwent then small bowel enteroscopy which noted intermittent blood pumping in the proximal jejunum but was unable to be reached despite multiple attempts, was injected and clip placed.  Repeat CTA repeated, which showed positive jejunal diverticular bleeding. Patient was transferred to [facility] for IR involvement. In IR active extravasation  at SMA territory, jejunal arcade branch, successful coil embolization.  PCCM was consulted for transfer and admission.  Pertinent  Medical History  HTN, HLD, DM, CVA on ASA/ plavix  Significant Hospital Events: Including procedures, antibiotic start and stop dates in addition to other pertinent events   8/31 presented to [facility], [name] Admit, Gi Consult 9/1 CTA> no evidence of active GI bleed, 2 U PRBC 9/2 2 U PRBC, colonoscopy > no active bleed found but large clots throughout entire colon. EGD> small hiatal hernia, erosive gastropathy with no stigmata of recent bleeding, and non-bleeding duodenal diverticulum.   Underwent then small bowel enteroscopy which noted intermittent blood pumping in the proximal jejunum  but was unable to be reached despite multiple attempts, was injected and clip placed. Repeat CTA repeated, which showed positive jejunal diverticular bleeding. IR> active extrav at [facility] territory, jejunal arcade branch, successful coil embo 9/3 Intubated. CTA> no evidence of active GI bleeding.  New finding of abnormal gallbladder with nondependent air, gallbladder wall air, pericholecystic and right upper quadrant information.  IR placed percutaneous cholecystotomy tube.  9/4 R. IJ central line placed with dialysis triple lumen catheter 9/5 CRRT initiated 9/6 Bedside EGD without any evidence of bleeding. CTA a/p without etiology for bleed 9/8 CRRT discontinued. Required ETT exchange yesterday due to blown cuff. 9/11 Bronchoscopic guided percutaneous tracheostomy placement.  Right IJ central line removed. 9/15 flexible sigmoidoscopy with removal of endoscopy Interim History / Subjective:   Continues to have nonpurposeful movements.  Agitated overnight requiring one-time dose of trazodone. per wife is following some commands however does not follow them with me.  MRI without contrast yesterday with numerous round white matter lesions in both cerebral hemispheres compared with MRI 04/28/2021.  Objective   Blood pressure (!) 140/88, pulse (!) 113, temperature 98.8 F (37.1 C), temperature source Axillary, resp. rate (!) 24, height 6' (1.829 m), weight 122.1 kg, SpO2 95 %. FiO2 (%):  [35 %] 35 %   Intake/Output Summary (Last 24 hours) at 09/21/2021 0958 Last data filed at 09/21/2021 0600 Gross per 24 hour  Intake 1220.81 ml  Output 1100 ml  Net 120.81 ml    Filed Weights   09/19/21 0500 09/20/21 0500 09/21/21 0500  Weight: 128.5 kg 125.6 kg 122.1 kg   Physical Exam: General: Chronically  ill-appearing, no acute distress HENT: [place], AT, MV via tracheostomy Eyes: No extraocular motion, pupils equal round reactive. Respiratory: Clear to auscultation bilaterally.  No crackles, wheezing or  rales Cardiovascular: RRR, -M/R/G, no JVD GI: BS+, soft, non-tender. Percutaneous chole drain in place.  Extremities: No edema present Neuro: Not following commands, moving extremities sporadically.  Does not withdrawal to pain. GU: External catheter  Resolved Hospital Problem list   AKI - off CRRT Septic shock Assessment & Plan:   Acute encephalopathy, possibly secondary to hypoxic-ischemic encephalopathy History of CVA without residual deficits, concern for recurrence Remained encephalopathic, required 1 dose trazodone yesterday evening for frequent movements and agitation.  Hepatic function panel and ammonia levels normal.  Continues to not follow commands or track and has sporadic movements of his extremities.  He is not withdrawing to pain.  MRI concerning for recurrent infarcts or demyelinating disease per radiology read, that are progressing and new since prior MRI in April 2023.  Neurology consulted appreciate their assistance.  Low suspicion for cardioembolic etiology as he remained afebrile and without leukocytosis.  TTE on 9/5 without evidence of vegetations. -Neurology consult placed -Monitor mental status and avoid sedating medications if possible -Continue aspirin, plavix and crestor -Will repeat labs this morning in anticipation of MRI w/ contrast  Acute hypoxemic respiratory failure s/p percutaneous tracheostomy 9/11 Continues to do well on trach collar. -Minimize sedation  Hemorrhagic shock - resolved ABLA secondary to lower GI bleed, jejunal diverticular and ulcers secondary to ischemia Acute blood loss and iron deficiency anemia, secondary to above - 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 and bedside EGD 9/6. Endoscopy capsule removal 9/15 No further melena. Hgb stable.  -MWF CBC -Transfuse PRBC if HBG less than 7  Ecoli Cholecystitis s/p percutaneous cholecystostomy drain placement 9/3 percutaneous cholecystostomy drain placed and continues to have  adequate drainage. -Completed total 7d of antibiotics: Ceftriaxone  -Continue to monitor drain -Trend fever/WBC -Will need outpatient surgery and IR follow up  AKI  - off CRRT 9/8 Bilateral nonobstructing nephrolithiasis - suspect chronic BPH BMP today in anticipation of MRI w/ contrast. Renal function has normalized. -Labs every Monday Wednesday Friday -Nephrology no longer following -continue lasix 40 mg PO daily -Trend electrolytes, replete as needed -Strict I/O  DM2 Current regimen of SSI, novolog 5 u q4h, and 30 U glargine. Will adjust glargine to 40 and novolog to 8.  -Blood Glucose goal 140-180 -Adjust as needed to meet goal above  HTN HLD -Amlodipine 10 mg daily -Hold home lisinopril  -Continue crestor   Pulmonary nodules in bilateral bases, L 60mm Family reports patient is a never smoker. Family does endorse dipping. -no further follow up needed for a low risk patient based on fleischner criteria  Best Practice (right click and "Reselect all SmartList Selections" daily)   Diet/type: TF DVT prophylaxis: prophylactic heparin  GI prophylaxis: PPI Lines: Foley, PICC RUE Foley:  yes Code Status:  full code Last date of multidisciplinary goals of care discussion [Full scope- Wife updated at bedside this morning  [place]  Internal Medicine Resident PGY-3 [place]  Pager: [phone]

## 2021-09-22 DIAGNOSIS — G9340 Encephalopathy, unspecified: Secondary | ICD-10-CM

## 2021-09-22 DIAGNOSIS — K922 Gastrointestinal hemorrhage, unspecified: Secondary | ICD-10-CM | POA: Diagnosis not present

## 2021-09-22 LAB — CSF CELL COUNT WITH DIFFERENTIAL
RBC Count, CSF: 1 /mm3 — ABNORMAL HIGH
RBC Count, CSF: 5 /mm3 — ABNORMAL HIGH
Tube #: 1
Tube #: 4
WBC, CSF: 0 /mm3 (ref 0–5)
WBC, CSF: 1 /mm3 (ref 0–5)

## 2021-09-22 LAB — CBC WITH DIFFERENTIAL/PLATELET
Abs Immature Granulocytes: 0.04 10*3/uL (ref 0.00–0.07)
Basophils Absolute: 0.1 10*3/uL (ref 0.0–0.1)
Basophils Relative: 1 %
Eosinophils Absolute: 0.2 10*3/uL (ref 0.0–0.5)
Eosinophils Relative: 3 %
HCT: 32.3 % — ABNORMAL LOW (ref 39.0–52.0)
Hemoglobin: 10.1 g/dL — ABNORMAL LOW (ref 13.0–17.0)
Immature Granulocytes: 1 %
Lymphocytes Relative: 11 %
Lymphs Abs: 0.9 10*3/uL (ref 0.7–4.0)
MCH: 28.5 pg (ref 26.0–34.0)
MCHC: 31.3 g/dL (ref 30.0–36.0)
MCV: 91.2 fL (ref 80.0–100.0)
Monocytes Absolute: 0.5 10*3/uL (ref 0.1–1.0)
Monocytes Relative: 6 %
Neutro Abs: 6.6 10*3/uL (ref 1.7–7.7)
Neutrophils Relative %: 78 %
Platelets: 264 10*3/uL (ref 150–400)
RBC: 3.54 MIL/uL — ABNORMAL LOW (ref 4.22–5.81)
RDW: 14.8 % (ref 11.5–15.5)
WBC: 8.4 10*3/uL (ref 4.0–10.5)
nRBC: 0 % (ref 0.0–0.2)

## 2021-09-22 LAB — BASIC METABOLIC PANEL
Anion gap: 7 (ref 5–15)
BUN: 31 mg/dL — ABNORMAL HIGH (ref 6–20)
CO2: 29 mmol/L (ref 22–32)
Calcium: 8.8 mg/dL — ABNORMAL LOW (ref 8.9–10.3)
Chloride: 113 mmol/L — ABNORMAL HIGH (ref 98–111)
Creatinine, Ser: 0.97 mg/dL (ref 0.61–1.24)
GFR, Estimated: >60 mL/min (ref >60)
Glucose, Bld: 250 mg/dL — ABNORMAL HIGH (ref 70–99)
Potassium: 4.2 mmol/L (ref 3.5–5.1)
Sodium: 149 mmol/L — ABNORMAL HIGH (ref 135–145)

## 2021-09-22 LAB — VITAMIN B12: Vitamin B-12: 1427 pg/mL — ABNORMAL HIGH (ref 180–914)

## 2021-09-22 LAB — MAGNESIUM: Magnesium: 2.2 mg/dL (ref 1.7–2.4)

## 2021-09-22 LAB — GLUCOSE, CAPILLARY
Glucose-Capillary: 182 mg/dL — ABNORMAL HIGH (ref 70–99)
Glucose-Capillary: 227 mg/dL — ABNORMAL HIGH (ref 70–99)
Glucose-Capillary: 267 mg/dL — ABNORMAL HIGH (ref 70–99)
Glucose-Capillary: 156 mg/dL — ABNORMAL HIGH (ref 70–99)
Glucose-Capillary: 235 mg/dL — ABNORMAL HIGH (ref 70–99)
Glucose-Capillary: 284 mg/dL — ABNORMAL HIGH (ref 70–99)

## 2021-09-22 LAB — PHOSPHORUS: Phosphorus: 3.9 mg/dL (ref 2.5–4.6)

## 2021-09-22 LAB — MENINGITIS/ENCEPHALITIS PANEL (CSF)

## 2021-09-22 LAB — PROTEIN AND GLUCOSE, CSF
Glucose, CSF: 126 mg/dL — ABNORMAL HIGH (ref 40–70)
Total  Protein, CSF: 29 mg/dL (ref 15–45)

## 2021-09-22 LAB — TSH: TSH: 1.996 u[IU]/mL (ref 0.350–4.500)

## 2021-09-22 LAB — HIV ANTIBODY (ROUTINE TESTING W REFLEX): HIV Screen 4th Generation wRfx: NONREACTIVE

## 2021-09-22 MED ORDER — MIDAZOLAM HCL 2 MG/2ML IJ SOLN: 4.0000 mg | Freq: Once | INTRAMUSCULAR | Status: AC

## 2021-09-22 MED ORDER — INSULIN ASPART 100 UNIT/ML IJ SOLN
12.0000 [IU] | INTRAMUSCULAR | Status: DC
  Administered 2021-09-22 – 2021-09-23 (×5): 12 [IU] via SUBCUTANEOUS

## 2021-09-22 MED ORDER — FENTANYL CITRATE PF 50 MCG/ML IJ SOSY: 100.0000 ug | PREFILLED_SYRINGE | Freq: Once | INTRAMUSCULAR | Status: DC

## 2021-09-22 MED ORDER — MIDAZOLAM HCL 2 MG/2ML IJ SOLN
INTRAMUSCULAR | Status: AC
  Administered 2021-09-22: 2 mg
  Filled 2021-09-22: qty 4

## 2021-09-22 MED ORDER — HEPARIN SODIUM (PORCINE) 5000 UNIT/ML IJ SOLN
5000.0000 [IU] | Freq: Three times a day (TID) | INTRAMUSCULAR | Status: DC
  Administered 2021-09-22 – 2021-09-29 (×21): 5000 [IU] via SUBCUTANEOUS
  Filled 2021-09-22 (×21): qty 1

## 2021-09-22 MED ORDER — ASPIRIN 81 MG PO CHEW
81.0000 mg | CHEWABLE_TABLET | Freq: Every day | ORAL | Status: DC
  Administered 2021-09-23 – 2021-10-06 (×14): 81 mg
  Filled 2021-09-22 (×14): qty 1

## 2021-09-22 MED ORDER — FENTANYL CITRATE PF 50 MCG/ML IJ SOSY
PREFILLED_SYRINGE | INTRAMUSCULAR | Status: AC
  Filled 2021-09-22: qty 2

## 2021-09-22 MED ORDER — LACTATED RINGERS IV BOLUS
100.0000 mL | Freq: Once | INTRAVENOUS | Status: AC
  Administered 2021-09-22: 100 mL via INTRAVENOUS

## 2021-09-22 NOTE — Progress Notes (Signed)
PT Cancellation Note  Patient Details Name: Troy Randall MRN: [id number] DOB: 1960-05-01   Cancelled Treatment:    Reason Eval/Treat Not Completed: Patient not medically ready.  Pt on bedrest from LP earlier, then going to another procedure/imaging. 09/22/2021  Ginger Carne., PT Acute Rehabilitation Services [phone]  (office)   Tessie Fass Skarlette Lattner 09/22/2021, 6:14 PM

## 2021-09-22 NOTE — Progress Notes (Signed)
ICU Pharmacy Insulin Protocol   CBGs above goal 140-180 mg/dL: Yes > 180    Current regimen: on resistant SSI (24 units in last 24 hours), lantus 40 daily + Novolog 8 Q4H (SSI 30u/24h)    Medications affecting CBG levels: Tube feeds restarted, currently running at goal (60 mL/hr)    Plan: Increase tube feed coverage to 12 units Q4H.  High likelihood of steroids being added this afternoon, will adjust long acting on 9/19.  1 BG < 100, however due to above will leave current SSI as is.

## 2021-09-22 NOTE — Progress Notes (Signed)
NAME:  Troy Randall, MRN:  [id number], DOB:  12-14-1960, LOS: 80 ADMISSION DATE:  09/04/2021, CONSULTATION DATE:  09/06/21 REFERRING MD:  [name], CHIEF COMPLAINT:  melena   History of Present Illness:   Troy Randall is an 61 y.o. male with prior history of HTN, HLD, DM, and CVA on ASA/ plavix who presented to [facility] on 8/31 after developing frequent 6-7 dark stools and feeling dizzy and lightheaded on standing.  No hx of NSAIDs.  FOBT positive and initial Hgb 12.2.    Patient admitted to [facility] with GI consulting.  Remained orthostatic with progressive decline in Hgb.  CTA abdomen did not show any active bleed, therefore underwent colonoscopy > no active bleed found but large clots throughout entire colon.  Then underwent EGD which showed a small hiatal hernia, erosive gastropathy with no stigmata of recent bleeding, and non-bleeding duodenal diverticulum.   Underwent then small bowel enteroscopy which noted intermittent blood pumping in the proximal jejunum but was unable to be reached despite multiple attempts, was injected and clip placed.  Repeat CTA repeated, which showed positive jejunal diverticular bleeding. Patient was transferred to [facility] for IR involvement. In IR active extravasation  at [facility] territory, jejunal arcade branch, successful coil embolization.  PCCM was consulted for transfer and admission.  Pertinent  Medical History  HTN, HLD, DM, CVA on ASA/ plavix  Significant Hospital Events: Including procedures, antibiotic start and stop dates in addition to other pertinent events   8/31 presented to [facility], [name] Admit, Gi Consult 9/1 CTA> no evidence of active GI bleed, 2 U PRBC 9/2 2 U PRBC, colonoscopy > no active bleed found but large clots throughout entire colon. EGD> small hiatal hernia, erosive gastropathy with no stigmata of recent bleeding, and non-bleeding duodenal diverticulum.   Underwent then small bowel enteroscopy which noted intermittent blood pumping in the proximal jejunum  but was unable to be reached despite multiple attempts, was injected and clip placed. Repeat CTA repeated, which showed positive jejunal diverticular bleeding. IR> active extrav at [facility] territory, jejunal arcade branch, successful coil embo 9/3 Intubated. CTA> no evidence of active GI bleeding.  New finding of abnormal gallbladder with nondependent air, gallbladder wall air, pericholecystic and right upper quadrant information.  IR placed percutaneous cholecystotomy tube.  9/4 R. IJ central line placed with dialysis triple lumen catheter 9/5 CRRT initiated 9/6 Bedside EGD without any evidence of bleeding. CTA a/p without etiology for bleed 9/8 CRRT discontinued. Required ETT exchange yesterday due to blown cuff. 9/11 Bronchoscopic guided percutaneous tracheostomy placement.  Right IJ central line removed. 9/15 flexible sigmoidoscopy with removal of endoscopy 9/16 MRI brain w/o contrast amended study secondary to range of motion.  Rapid development numerous round white matter lesions in both cerebral hemispheres.  Prior MRIs. 9/17 MRI brain with contrast numerous contrast-enhancing bilateral white matter lesions consistent with active demyelination Interim History / Subjective:  Patient required Precedex and 1 dose of Versed to obtain his MRI yesterday evening.  After giving the 2 mg of Versed he became hypotensive and required phenylephrine for short period of time.  Continues to be encephalopathic and not following commands.  Objective   Blood pressure 132/76, pulse 87, temperature 98.1 F (36.7 C), temperature source Oral, resp. rate (!) 27, height 6' (1.829 m), weight 122.1 kg, SpO2 95 %. FiO2 (%):  [28 %-35 %] 28 %   Intake/Output Summary (Last 24 hours) at 09/22/2021 3790 Last data filed at 09/22/2021 0500 Gross per 24 hour  Intake  1629.14 ml  Output 1850 ml  Net -220.86 ml    Filed Weights   09/19/21 0500 09/20/21 0500 09/21/21 0500  Weight: 128.5 kg 125.6 kg 122.1 kg   Physical  Exam: General: Chronically ill-appearing, no acute distress HENT: [place], AT, MV via tracheostomy Eyes: No extraocular motion, pupils equal round reactive. Respiratory: Clear to auscultation bilaterally.  No crackles, wheezing or rales Cardiovascular: RRR, -M/R/G, no JVD GI: BS+, soft, non-tender. Percutaneous chole drain in place.  Extremities: Trace edema lower extremity Neuro: Not following commands, moving extremities sporadically.  Does not withdrawal to pain. GU: External catheter  Resolved Hospital Problem list   AKI - off CRRT Septic shock Jejunal diverticular bleed status postembolization Assessment & Plan:   Acute encephalopathy, possibly secondary to hypoxic-ischemic encephalopathy History of CVA without residual deficits, concern for recurrence Continues to be unresponsive not following commands moving extremities sporadically.  MRI with contrast with evidence of numerous contrast-enhancing bilateral white matter lesions are concerning for active demyelination.  Neurology following and recommending lumbar puncture as well as MR spine.  He received IV gadolinium last night.  If no infectious etiology suspected we will plan to start steroids. -Neurology following appreciate their assistance -Lumbar puncture today, sedation as needed to complete procedure -MR C-spine this evening -TSH, B12 normal.  B1 pending -Monitor mental status and avoid sedating medications if possible -Hold aspirin, plavix, heparin -Continue crestor  Acute hypoxemic respiratory failure s/p percutaneous tracheostomy 9/11 Continues to do well on trach collar, quiring 5 L supplemental oxygen -Minimize sedation  Hemorrhagic shock - resolved ABLA secondary to lower GI bleed, jejunal diverticular and ulcers secondary to ischemia Acute blood loss and iron deficiency anemia, secondary to above - 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 and bedside EGD 9/6. Endoscopy capsule removal 9/15 No further  melena. Hgb stable.  -MWF CBC -Transfuse PRBC if HBG less than 7  Ecoli Cholecystitis s/p percutaneous cholecystostomy drain placement 9/3 percutaneous cholecystostomy drain placed and continues to have adequate drainage. -Completed total 7d of antibiotics: Ceftriaxone  -Continue to monitor drain -Trend fever/WBC -Will need outpatient surgery and IR follow up  AKI  - off CRRT 9/8 Bilateral nonobstructing nephrolithiasis - suspect chronic BPH BMP today in anticipation of MRI w/ contrast. Renal function has normalized. -Labs every Monday Wednesday Friday -Nephrology no longer following -continue lasix 40 mg PO daily -Trend electrolytes, replete as needed -Strict I/O  DM2 Current regimen of SSI, novolog 8 u q4h, and 40 U glargine.  Adjust as needed with initiation of steroids.  -Blood Glucose goal 140-180 -Adjust as needed to meet goal above  HTN HLD -Amlodipine 10 mg daily -Hold home lisinopril  -Continue crestor   Pulmonary nodules in bilateral bases, L 43mm Family reports patient is a never smoker. Family does endorse dipping. -no further follow up needed for a low risk patient based on fleischner criteria  Best Practice (right click and "Reselect all SmartList Selections" daily)   Diet/type: TF DVT prophylaxis: Hold in setting of LP today GI prophylaxis: PPI Lines: Foley, PICC RUE Foley:  yes Code Status:  full code Last date of multidisciplinary goals of care discussion [Full scope- Wife updated at bedside this morning  Thalia Bloodgood DO  Internal Medicine Resident PGY-3 [place]  Pager: [phone]

## 2021-09-22 NOTE — Progress Notes (Signed)
RT NOTE: RT removed trach sutures per protocol and CCM as patient is 7 days post tracheostomy placement. Trach care performed, trach ties changed and secured. Vitals are stable. RT will continue to monitor.

## 2021-09-22 NOTE — Evaluation (Signed)
[facility] Speaking [facility]ve - Evaluation Patient Details  Name: ARCHIT LEGER MRN: [id number] Date of Birth: 1960/04/26  Today's Date: 09/22/2021 Time: 6761-9509 SLP Time Calculation (min) (ACUTE ONLY): 14 min  Past Medical History:  Past Medical History:  Diagnosis Date   Anemia    Arthritis    CVA (cerebral vascular accident) (HCC)    Diabetes mellitus without complication (HCC)    Hyperlipidemia    Hypertension    Kidney stones    Past Surgical History:  Past Surgical History:  Procedure Laterality Date   ANKLE SURGERY     BIOPSY  09/10/2021   Procedure: BIOPSY;  Surgeon: Jeani Hawking, MD;  Location: [facility] ENDOSCOPY;  Service: Gastroenterology;;   COLONOSCOPY WITH PROPOFOL N/A 09/06/2021   Procedure: COLONOSCOPY WITH PROPOFOL;  Surgeon: Dolores Frame, MD;  Location: [facility] ENDO SUITE;  Service: Gastroenterology;  Laterality: N/A;   ENTEROSCOPY N/A 09/10/2021   Procedure: ENTEROSCOPY;  Surgeon: Jeani Hawking, MD;  Location: [facility] ENDOSCOPY;  Service: Gastroenterology;  Laterality: N/A;   ENTEROSCOPY  09/06/2021   Procedure: ENTEROSCOPY;  Surgeon: Dolores Frame, MD;  Location: [facility] ENDO SUITE;  Service: Gastroenterology;;   ESOPHAGOGASTRODUODENOSCOPY (EGD) WITH PROPOFOL  09/06/2021   Procedure: ESOPHAGOGASTRODUODENOSCOPY (EGD) WITH PROPOFOL;  Surgeon: Dolores Frame, MD;  Location: [facility] ENDO SUITE;  Service: Gastroenterology;;   Wenda Low SIGMOIDOSCOPY N/A 09/19/2021   Procedure: Arnell Sieving;  Surgeon: Jeani Hawking, MD;  Location: [facility] ENDOSCOPY;  Service: Gastroenterology;  Laterality: N/A;   FOREIGN BODY REMOVAL  09/19/2021   Procedure: FOREIGN BODY REMOVAL;  Surgeon: Jeani Hawking, MD;  Location: [facility] ENDOSCOPY;  Service: Gastroenterology;;   Emelda Brothers CAPSULE STUDY  09/06/2021   Procedure: GIVENS CAPSULE STUDY;  Surgeon: Dolores Frame, MD;  Location: [facility] ENDO SUITE;  Service: Gastroenterology;;   HERNIA REPAIR     umbilical x1 Incisional x1    INCISIONAL HERNIA REPAIR  04/13/2011   Procedure: LAPAROSCOPIC INCISIONAL HERNIA;  Surgeon: Dalia Heading, MD;  Location: [facility] ORS;  Service: General;  Laterality: N/A;  Recurrent Laparoscopic Incisional Herniorraphy with Mesh   IR ANGIOGRAM SELECTIVE EACH ADDITIONAL VESSEL  09/09/2021   IR ANGIOGRAM VISCERAL SELECTIVE  09/07/2021   IR ANGIOGRAM VISCERAL SELECTIVE  09/06/2021   IR ANGIOGRAM VISCERAL SELECTIVE  09/06/2021   IR EMBO ART  VEN HEMORR LYMPH EXTRAV  INC GUIDE ROADMAPPING  09/06/2021   IR GUIDED DRAIN W CATHETER PLACEMENT  09/07/2021   IR US GUIDE BX ASP/DRAIN  09/07/2021   IR US GUIDE VASC ACCESS RIGHT  09/07/2021   IR US GUIDE VASC ACCESS RIGHT  09/06/2021   KIDNEY STONE SURGERY     HPI:  Pt is a 61 y.o. male with history of stroke in 4/23, HTN, HLD and DM with recent severe jejunal bleed s/p vessel coiling who is being seen for altered mental status and concerning white matter changes on MRI. Patient's wife reports that after his stroke in 4/23 he made a good recovery and was able to perform ADLs. He had some residual dysarthria. Pt presented to [facility] on 8/31 with GI bleeding and has remained persistently altered. Intubated 9/3 and trach placed 9/11.    Assessment / Plan / Recommendation  Clinical Impression   Pt remained lethargic and showed consistent nonpurposeful movements of the lower extremities throughout encounter. Oral care was provided. Pt was repositioned and PMV donned. No attempts to phonate were made due to lethargic state. Vital signs remained consistent and no evidence of air trapping were noted. BSE not  attempted as pt unable to remain awake. Wife educated on the [facility] and  future treatment plans. PMV with SLP only. SLP will follow for further PMV trials and BSE when appropriate.  SLP Visit Diagnosis: Aphonia (R49.1)    SLP Assessment  Patient needs continued Speech [facility]    Recommendations for follow up therapy are one component of a  multi-disciplinary discharge planning process, led by the attending physician.  Recommendations may be updated based on patient status, additional functional criteria and insurance authorization.  Follow Up Recommendations  Long-term institutional care without follow-up therapy    Assistance Recommended at Discharge Frequent or constant Supervision/Assistance  Functional Status Assessment Patient has had a recent decline in their functional status and demonstrates the ability to make significant improvements in function in a reasonable and predictable amount of time.  Frequency and Duration min 2x/week  2 weeks    PMSV Trial PMSV was placed for: 10 Able to redirect subglottic air through upper airway: Yes Able to Attain Phonation: No attempt to phonate Able to Expectorate Secretions: No attempts Respirations During Trial: 19 SpO2 During Trial: 95 % Pulse During Trial: 77 Behavior: No attempt to communicate;Lethargic   Tracheostomy Tube       Vent Dependency  FiO2 (%): 28 %    Cuff Deflation Trial           Susann Givens Student SLP  09/22/2021, 10:11 AM

## 2021-09-22 NOTE — Procedures (Signed)
Lumbar Puncture Procedure Note  Pre-operative Diagnosis: Metabolic encephalopathy, concern for demyelinating process   Indications: Determine encephalopathy etiology  Procedure Details:  Informed consent was obtained after explanation of the risks and benefits of the procedure, refer to the consent documentation.  Time-out performed immediately prior to the procedure.  Patient was placed in the left lateral decubitus   The superior aspect of the iliac crests were identified, with the traverse demarcating the L4-L5 interspace.  A bedside ultrasound was used to help identify the spinous processes at L4 - L5 and the intervertebral space was located and marked.  This area was prepped and draped in the usual sterile fashion. Maximum sterile technique was used including antiseptics, cap, gloves, gown, hand hygiene, mask, and sterile sheet.  Local anesthesia with 1% lidocaine was applied subcutaneously then deep to the skin. The spinal needle with trocar was introduced with frequent removal of the trocar to evaluate for cerebrospinal fluid. When this fluid was noted an opening pressure was obtained and samples were collected in four separate tubes and sent to the lab after proper labeling. The spinal needle with trocar was removed, with minimal bleeding noted upon removal. A sterile bandage was placed over the puncture site after holding pressure.  A spinal needle was inserted at the L4 - L5 interspace.   Findings: 9 CC of clear spinal fluid was obtained. Tube 1,4 sent for meningitis panel, oligoclonal bands, IgG index  Opening Pressure: 15cm H2O pressure.        Condition:   The patient tolerated the procedure well and remains in the same condition as pre-procedure.  Complications: None; patient tolerated the procedure well.  Plan: Pt to remain supine for 1 hour.

## 2021-09-22 NOTE — Progress Notes (Signed)
Referring Physician(s): Dr, Concepcion Living  Supervising Physician: Juliet Rude  Patient Status:  [facility] - In-pt  Chief Complaint:  GIB s/p coil embo of SMA territory and jejunal arch branch on 9/2 with Dr. Maryelizabeth Kaufmann Acute cholecystitis with cholelithiasis s/p cholecystomy tube placement on 9/3 with Dr. Owens Shark  Subjective:  Patient sitting in bed, NAD. Spouse at bedside.  Patient remains confused, not able to answer question appropriately.  Spouse states that they found two spots in patient's brain and planing on spinal tap at bedside today.   Allergies: Metformin and related and Penicillins  Medications: Prior to Admission medications   Medication Sig Start Date End Date Taking? Authorizing Provider  amLODipine (NORVASC) 10 MG tablet Take 1 tablet (10 mg total) by mouth daily. 06/15/17  Yes Caren Macadam, MD  aspirin EC 81 MG tablet Take 81 mg by mouth daily. Swallow whole.   Yes [provider]  clopidogrel (PLAVIX) 75 MG tablet Take 1 tablet (75 mg total) by mouth daily. 04/30/21  Yes Little Ishikawa, MD  glipiZIDE (GLUCOTROL XL) 10 MG 24 hr tablet Take 1 tablet (10 mg total) by mouth daily with breakfast. 04/16/17  Yes Hagler, Apolonio Schneiders, MD  levocetirizine (XYZAL) 5 MG tablet Take 1 tablet by mouth at bedtime.   Yes [provider]  lisinopril (PRINIVIL,ZESTRIL) 40 MG tablet Take 1 tablet (40 mg total) by mouth daily. 05/12/17  Yes Caren Macadam, MD  metFORMIN (GLUCOPHAGE-XR) 500 MG 24 hr tablet Take 500-1,000 mg by mouth in the morning and at bedtime. 02/09/21  Yes [provider]  rosuvastatin (CRESTOR) 40 MG tablet Take 1 tablet (40 mg total) by mouth daily. 04/29/21  Yes Little Ishikawa, MD  tamsulosin (FLOMAX) 0.4 MG CAPS capsule Take 0.4 mg by mouth daily. 02/12/21  Yes [provider]  traZODone (DESYREL) 150 MG tablet Take 150 mg by mouth at bedtime. 02/04/21  Yes [provider]  TRESIBA FLEXTOUCH 200 UNIT/ML FlexTouch Pen Inject  20 Units into the skin in the morning and at bedtime. 03/14/21  Yes [provider]  BD VEO INSULIN SYRINGE U/F 31G X 15/64" 1 ML MISC  USE AS DIRECTED 06/08/17   Caren Macadam, MD  glucose blood (ONETOUCH VERIO) test strip TEST twice a day 05/04/17   Caren Macadam, MD  Insulin Pen Needle (NOVOTWIST) 32G X 5 MM MISC Use two daily to inject Victoza and Toujeo. 04/25/15   Elayne Snare, MD  [facility] DELICA LANCETS 75I MISC Use to check blood sugar once a day dx code E11.65 11/21/14   Elayne Snare, MD     Vital Signs: BP 119/77   Pulse 85   Temp 98.2 F (36.8 C) (Axillary)   Resp (!) 23   Ht 6' (1.829 m)   Wt 269 lb 2.9 oz (122.1 kg)   SpO2 94%   BMI 36.51 kg/m   Physical Exam Vitals reviewed.  Constitutional:      General: He is not in acute distress.    Appearance: He is ill-appearing.  Cardiovascular:     Rate and Rhythm: Normal rate and regular rhythm.  Pulmonary:     Effort: Pulmonary effort is normal.  Abdominal:     General: Abdomen is flat.     Palpations: Abdomen is soft.     Comments: Positive RUQ drain to gravity bag. Site is unremarkable with no erythema, edema, tenderness, bleeding or drainage noted at exit site. Suture intact. Dressing is clean dry and intact. Trace of  dark brown colored fluid noted in gravity bag. Drain is able to be flushed and aspirated easily.     Skin:    General: Skin is warm and dry.     Coloration: Skin is not jaundiced or pale.     Imaging: MR BRAIN W CONTRAST  Result Date: 09/21/2021 CLINICAL DATA:  Possible demyelination EXAM: MRI HEAD WITH CONTRAST TECHNIQUE: Multiplanar, multiecho pulse sequences of the brain and surrounding structures were obtained with intravenous contrast. CONTRAST:  10 mL Gadavist COMPARISON:  None Available. FINDINGS: There are numerous contrast-enhancing bilateral white matter lesions (at least 20). Largest lesions are in the occipital lobes, left frontal lobe and right parietal lobe. The largest lesion  measures 8 mm. The findings are otherwise unchanged from yesterday's examination. IMPRESSION: Numerous contrast-enhancing bilateral white matter lesions, consistent with active demyelination Electronically Signed   By: Deatra Robinson M.D.   On: 09/21/2021 19:23   MR BRAIN WO CONTRAST  Result Date: 09/20/2021 CLINICAL DATA:  Abnormal CT.  Progression of white matter disease EXAM: MRI HEAD WITHOUT CONTRAST TECHNIQUE: Multiplanar, multiecho pulse sequences of the brain and surrounding structures were obtained without intravenous contrast. COMPARISON:  CT head 09/12/2021.  MRI head 04/28/2021 FINDINGS: Brain: Limited study. Patient was not cooperative not able to hold still. The study was not completed. Available images are degraded by motion. Numerous white matter lesions have progressed significantly since April 28, 2021. These are rounded appearance and hyperintense on diffusion-weighted imaging and show mildly restricted diffusion. These involve the frontal parietal white matter and the splenium. Chronic infarct in the left centrum semiovale which was seen previously. Brainstem and cerebellum intact. No mass or fluid collection. Ventricle size normal. Vascular: Normal arterial flow voids Skull and upper cervical spine: Negative Sinuses/Orbits: Paranasal sinuses clear. Mild mastoid effusion bilaterally. Negative orbit Other: None IMPRESSION: Limited study.  Incomplete study which is degraded by motion Rapid development of numerous round white matter lesions in both cerebral hemispheres compared with the MRI of 04/28/2021. These do show mildly restricted diffusion and may represent recent infarcts. Given the numerous lesions, rounded appearance and rapid development also consider demyelinating disease. Repeat MRI with contrast may be helpful in the patient is able to cooperate. Also consider lumbar puncture. Electronically Signed   By: Marlan Palau M.D.   On: 09/20/2021 15:29   EEG adult  Result Date:  09/19/2021 Charlsie Quest, MD     09/19/2021  5:59 PM Patient Name: Troy Randall: [id number] Epilepsy Attending: Charlsie Quest Referring Physician/Provider: Belva Agee, MD Date: 09/19/2021 Duration: 22.50 mins Patient history: 61yo M with ams. EEG to evaluate for seizure Level of alertness: Awake AEDs during EEG study: None Technical aspects: This EEG study was done with scalp electrodes positioned according to the 10-20 International system of electrode placement. Electrical activity was reviewed with band pass filter of 1-70Hz , sensitivity of 7 uV/mm, display speed of 46mm/sec with a 60Hz  notched filter applied as appropriate. EEG data were recorded continuously and digitally stored.  Video monitoring was available and reviewed as appropriate. Description: EEG showed continuous generalized predominantly 5-8Hz  theta-alpha activity admixed with 2-3Hz  delta slowing. Hyperventilation and photic stimulation were not performed.   ABNORMALITY - Continuous slow, generalized IMPRESSION: This study is suggestive of moderate diffuse encephalopathy, nonspecific etiology. No seizures or epileptiform discharges were seen throughout the recording.    Labs:  CBC: Recent Labs    09/16/21 0423 09/17/21 0516 09/19/21 0836 09/19/21 0942 09/21/21 1008  WBC 10.5 10.0  10.1  --  10.0  HGB 10.0* 10.1* 10.7* 10.5* 11.2*  HCT 32.5* 32.2* 34.6* 31.0* 35.7*  PLT 328 360 450*  --  366    COAGS: Recent Labs    04/28/21 1504 09/05/21 0307 09/06/21 2240 09/07/21 2056  INR 1.0 1.2 1.6* 2.0*  APTT 26  --   --  32    BMP: Recent Labs    09/16/21 0423 09/17/21 0516 09/19/21 0836 09/19/21 0942 09/21/21 1008  NA 143 144 145 146* 147*  K 3.8 3.6 3.7 3.8 4.2  CL 110 110 110  --  114*  CO2 --  25  GLUCOSE 160* 141* 242*  --  323*  BUN --  24*  CALCIUM 8.0* 8.1* 8.4*  --  8.6*  CREATININE 0.83 0.77 0.76  --  0.83  GFRNONAA >60 >60 >60  --  >60    LIVER  FUNCTION TESTS: Recent Labs    09/04/21 1238 09/06/21 2240 09/07/21 2054 09/08/21 1808 09/13/21 0327 09/14/21 0504 09/15/21 0549 09/20/21 1443  BILITOT 0.8 0.4 0.5  --   --   --   --  0.8  AST 15 21 33  --   --   --   --  25  ALT 17 16 32  --   --   --   --  48*  ALKPHOS 35* 21* 124  --   --   --   --  109  PROT 6.9 3.7* 3.2*  --   --   --   --  6.1*  ALBUMIN 3.7 1.8* <1.5*   < > <1.5* 1.6* 1.6* 2.2*   < > = values in this interval not displayed.    Assessment and Plan:  61 y.o. male inpatient. History of HTN, HLD, DM, CVA ( on ASA and plavix). Presented to the ED at [facility] on 8.31.23 with dark stools, dizzy and lightheaded. EGD showed no active bleeding but large clots throughout the entire colon. Repeat CTA showed active extravasation proximal jejunum secondary to presumed small bowel divertiuli. CT also showed acute cholecystitis. IR performed an arteriogram and colil embo on 9/2, cholecystomy tube placement on 09/07/21.  WBC normal  Afebrile  Tachycardic and tachypenic at times    Drain Location: RUQ Size: Fr size: 10 Fr Date of placement: 09/07/21  Currently to: Drain collection device: gravity 24 hour output:  Output by Drain (mL) 09/20/21 0701 - 09/20/21 1900 09/20/21 1901 - 09/21/21 0700 09/21/21 0701 - 09/21/21 1900 09/21/21 1901 - 09/22/21 0700 09/22/21 0701 - 09/22/21 1017  Closed System Drain 1 Lateral RUQ Other (Comment) 10.2 Fr. 100  250 100     Interval imaging/drain manipulation:  None pertinent to perc chole  CTA [facility] on 9/6 showed no acute bleeding   Current examination: Flushes/aspirates easily.  Insertion site unremarkable. Suture and stat lock in place. Dressed appropriately.   Plan: Continue TID flushes with 5 cc NS. Record output Q shift. Dressing changes QD or PRN if soiled.  Call IR APP or on call IR MD if difficulty flushing or sudden change in drain output.  Repeat imaging/possible drain injection once output < 10 mL/QD (excluding flush material).  Consideration for drain removal if output is < 10 mL/QD (excluding flush material), pending discussion with the providing surgical service.  Discharge planning: Please contact IR APP or on call IR MD prior to patient d/c to ensure appropriate follow up plans are in place. Typically patient  will follow up with IR clinic 6-8 weeks post d/c for repeat imaging/possible drain injection. IR scheduler will contact patient with date/time of appointment. Patient will need to flush drain QD with 5 cc NS, record output QD, dressing changes every 2-3 days or earlier if soiled.   IR will continue to follow - please call with questions or concerns.  Electronically Signed: Willette Brace, PA-C 09/22/2021, 10:17 AM   I spent a total of 15 Minutes at the patient's bedside AND on the patient's hospital floor or unit, greater than 50% of which was counseling/coordinating care for cholecystomy tube placement.

## 2021-09-22 NOTE — Progress Notes (Signed)
Subjective: Was sedated earlier this AM to manage agitation with flailing movements of all 4 extremities.   Objective: Current vital signs: BP 119/77   Pulse 85   Temp 98.2 F (36.8 C) (Axillary)   Resp (!) 23   Ht 6' (1.829 m)   Wt 122.1 kg   SpO2 94%   BMI 36.51 kg/m  Vital signs in last 24 hours: Temp:  [98.1 F (36.7 C)-98.9 F (37.2 C)] 98.2 F (36.8 C) (09/18 0737) Pulse Rate:  [62-118] 85 (09/18 1000) Resp:  [16-31] 23 (09/18 1000) BP: (79-141)/(54-97) 119/77 (09/18 1000) SpO2:  [5 %-99 %] 94 % (09/18 1000) FiO2 (%):  [28 %-35 %] 28 % (09/18 0737)  Intake/Output from previous day: 09/17 0701 - 09/18 0700 In: 1690.5 [I.V.:250.5; NG/GT:1440] Out: 1950 [Urine:1600; Drains:350] Intake/Output this shift: Total I/O In: 454.4 [I.V.:64.4; NG/GT:390] Out: -  Nutritional status:  Diet Order             Diet NPO time specified  Diet effective now                  HEENT: [place]/AT. Trach collar is noted.  Ext: No edema  Neurologic Exam: Ment: Awake with eyes half-open. Does not fixate or track. No attempts to communicate with gestures or mouthing of words. Not following any commands.   CN: Pupils 3 mm and reactive. Does not blink to threat. Does not fixate or track  Face is symmetric but with decreased tone to mouth/jaw.   Motor/Sensory: Weak movement of extremities to noxious without asymmetry. Has decreased movement since he was sedated earlier this AM. Does not follow commands for formal strength testing.  Reflexes: 1+ in upper and lower extremities Cerebellar/Gait: Unable to assess   Lab Results: Results for orders placed or performed during the hospital encounter of 09/04/21 (from the past 48 hour(s))  Glucose, capillary     Status: Abnormal   Collection Time: 09/20/21 11:37 AM  Result Value Ref Range   Glucose-Capillary 297 (H) 70 - 99 mg/dL    Comment: Glucose reference range applies only to samples taken after fasting for at least 8 hours.  Ammonia      Status: None   Collection Time: 09/20/21  2:43 PM  Result Value Ref Range   Ammonia 24 9 - 35 umol/L    Comment: Performed at [facility], [address] [place], [place] [address]  Hepatic function panel     Status: Abnormal   Collection Time: 09/20/21  2:43 PM  Result Value Ref Range   Total Protein 6.1 (L) 6.5 - 8.1 g/dL   Albumin 2.2 (L) 3.5 - 5.0 g/dL   AST 25 15 - 41 U/L   ALT 48 (H) 0 - 44 U/L   Alkaline Phosphatase 109 38 - 126 U/L   Total Bilirubin 0.8 0.3 - 1.2 mg/dL   Bilirubin, Direct 0.2 0.0 - 0.2 mg/dL   Indirect Bilirubin 0.6 0.3 - 0.9 mg/dL    Comment: Performed at [facility], [address] [place], [place] [address]  Glucose, capillary     Status: Abnormal   Collection Time: 09/20/21  3:35 PM  Result Value Ref Range   Glucose-Capillary 274 (H) 70 - 99 mg/dL    Comment: Glucose reference range applies only to samples taken after fasting for at least 8 hours.  Glucose, capillary     Status: Abnormal   Collection Time: 09/20/21  7:41 PM  Result Value Ref Range  Glucose-Capillary 184 (H) 70 - 99 mg/dL    Comment: Glucose reference range applies only to samples taken after fasting for at least 8 hours.  Glucose, capillary     Status: Abnormal   Collection Time: 09/20/21 11:18 PM  Result Value Ref Range   Glucose-Capillary 175 (H) 70 - 99 mg/dL    Comment: Glucose reference range applies only to samples taken after fasting for at least 8 hours.  Glucose, capillary     Status: Abnormal   Collection Time: 09/21/21  4:16 AM  Result Value Ref Range   Glucose-Capillary 222 (H) 70 - 99 mg/dL    Comment: Glucose reference range applies only to samples taken after fasting for at least 8 hours.  Glucose, capillary     Status: Abnormal   Collection Time: 09/21/21  8:29 AM  Result Value Ref Range   Glucose-Capillary 281 (H) 70 - 99 mg/dL    Comment: Glucose reference range applies only to samples taken after fasting for at least 8 hours.  Basic metabolic  panel     Status: Abnormal   Collection Time: 09/21/21 10:08 AM  Result Value Ref Range   Sodium 147 (H) 135 - 145 mmol/L   Potassium 4.2 3.5 - 5.1 mmol/L   Chloride 114 (H) 98 - 111 mmol/L   CO2 25 22 - 32 mmol/L   Glucose, Bld 323 (H) 70 - 99 mg/dL    Comment: Glucose reference range applies only to samples taken after fasting for at least 8 hours.   BUN 24 (H) 6 - 20 mg/dL   Creatinine, Ser 0.83 0.61 - 1.24 mg/dL   Calcium 8.6 (L) 8.9 - 10.3 mg/dL   GFR, Estimated >60 >60 mL/min    Comment: (NOTE) Calculated using the CKD-EPI Creatinine Equation (2021)    Anion gap 8 5 - 15    Comment: Performed at [facility] [address] [place], [place] [address]  Magnesium     Status: None   Collection Time: 09/21/21 10:08 AM  Result Value Ref Range   Magnesium 2.2 1.7 - 2.4 mg/dL    Comment: Performed at [facility] [address] [place], [place] [address]  Phosphorus     Status: None   Collection Time: 09/21/21 10:08 AM  Result Value Ref Range   Phosphorus 3.8 2.5 - 4.6 mg/dL    Comment: Performed at [facility] [address] [place], [place] [address]  CBC with Differential/Platelet     Status: Abnormal   Collection Time: 09/21/21 10:08 AM  Result Value Ref Range   WBC 10.0 4.0 - 10.5 K/uL   RBC 3.92 (L) 4.22 - 5.81 MIL/uL   Hemoglobin 11.2 (L) 13.0 - 17.0 g/dL   HCT 35.7 (L) 39.0 - 52.0 %   MCV 91.1 80.0 - 100.0 fL   MCH 28.6 26.0 - 34.0 pg   MCHC 31.4 30.0 - 36.0 g/dL   RDW 14.9 11.5 - 15.5 %   Platelets 366 150 - 400 K/uL   nRBC 0.0 0.0 - 0.2 %   Neutrophils Relative % 82 %   Neutro Abs 8.2 (H) 1.7 - 7.7 K/uL   Lymphocytes Relative 8 %   Lymphs Abs 0.8 0.7 - 4.0 K/uL   Monocytes Relative 7 %   Monocytes Absolute 0.7 0.1 - 1.0 K/uL   Eosinophils Relative 2 %   Eosinophils Absolute 0.2 0.0 - 0.5 K/uL   Basophils Relative 1 %   Basophils Absolute  0.1 0.0 - 0.1 K/uL   Immature Granulocytes 0 %   Abs Immature Granulocytes 0.03 0.00 - 0.07 K/uL     Comment: Performed at [facility], [address], [place], [place] [address]  Glucose, capillary     Status: Abnormal   Collection Time: 09/21/21 12:16 PM  Result Value Ref Range   Glucose-Capillary 343 (H) 70 - 99 mg/dL    Comment: Glucose reference range applies only to samples taken after fasting for at least 8 hours.  Glucose, capillary     Status: Abnormal   Collection Time: 09/21/21  3:34 PM  Result Value Ref Range   Glucose-Capillary 218 (H) 70 - 99 mg/dL    Comment: Glucose reference range applies only to samples taken after fasting for at least 8 hours.  Glucose, capillary     Status: None   Collection Time: 09/21/21  7:20 PM  Result Value Ref Range   Glucose-Capillary 97 70 - 99 mg/dL    Comment: Glucose reference range applies only to samples taken after fasting for at least 8 hours.  Glucose, capillary     Status: Abnormal   Collection Time: 09/21/21 11:12 PM  Result Value Ref Range   Glucose-Capillary 161 (H) 70 - 99 mg/dL    Comment: Glucose reference range applies only to samples taken after fasting for at least 8 hours.  Glucose, capillary     Status: Abnormal   Collection Time: 09/22/21  3:22 AM  Result Value Ref Range   Glucose-Capillary 182 (H) 70 - 99 mg/dL    Comment: Glucose reference range applies only to samples taken after fasting for at least 8 hours.  Vitamin B12     Status: Abnormal   Collection Time: 09/22/21  3:25 AM  Result Value Ref Range   Vitamin B-12 1,427 (H) 180 - 914 pg/mL    Comment: (NOTE) This assay is not validated for testing neonatal or myeloproliferative syndrome specimens for Vitamin B12 levels. Performed at [facility], [address] [place], [place] [address]   TSH     Status: None   Collection Time: 09/22/21  3:25 AM  Result Value Ref Range   TSH 1.996 0.350 - 4.500 uIU/mL    Comment: Performed by a 3rd Generation assay with a functional sensitivity of <=0.01 uIU/mL. Performed at [facility],  [address], [place], [place] [address]   Glucose, capillary     Status: Abnormal   Collection Time: 09/22/21  7:50 AM  Result Value Ref Range   Glucose-Capillary 227 (H) 70 - 99 mg/dL    Comment: Glucose reference range applies only to samples taken after fasting for at least 8 hours.    No results found for this or any previous visit (from the past 240 hour(s)).  Lipid Panel No results for input(s): "CHOL", "TRIG", "HDL", "CHOLHDL", "VLDL", "LDLCALC" in the last 72 hours.  Studies/Results: MR BRAIN W CONTRAST  Result Date: 09/21/2021 CLINICAL DATA:  Possible demyelination EXAM: MRI HEAD WITH CONTRAST TECHNIQUE: Multiplanar, multiecho pulse sequences of the brain and surrounding structures were obtained with intravenous contrast. CONTRAST:  10 mL Gadavist COMPARISON:  None Available. FINDINGS: There are numerous contrast-enhancing bilateral white matter lesions (at least 20). Largest lesions are in the occipital lobes, left frontal lobe and right parietal lobe. The largest lesion measures 8 mm. The findings are otherwise unchanged from yesterday's examination. IMPRESSION: Numerous contrast-enhancing bilateral white matter lesions, consistent with active demyelination Electronically Signed   By:  Deatra Robinson M.D.   On: 09/21/2021 19:23   MR BRAIN WO CONTRAST  Result Date: 09/20/2021 CLINICAL DATA:  Abnormal CT.  Progression of white matter disease EXAM: MRI HEAD WITHOUT CONTRAST TECHNIQUE: Multiplanar, multiecho pulse sequences of the brain and surrounding structures were obtained without intravenous contrast. COMPARISON:  CT head 09/12/2021.  MRI head 04/28/2021 FINDINGS: Brain: Limited study. Patient was not cooperative not able to hold still. The study was not completed. Available images are degraded by motion. Numerous white matter lesions have progressed significantly since April 28, 2021. These are rounded appearance and hyperintense on diffusion-weighted imaging and show mildly restricted  diffusion. These involve the frontal parietal white matter and the splenium. Chronic infarct in the left centrum semiovale which was seen previously. Brainstem and cerebellum intact. No mass or fluid collection. Ventricle size normal. Vascular: Normal arterial flow voids Skull and upper cervical spine: Negative Sinuses/Orbits: Paranasal sinuses clear. Mild mastoid effusion bilaterally. Negative orbit Other: None IMPRESSION: Limited study.  Incomplete study which is degraded by motion Rapid development of numerous round white matter lesions in both cerebral hemispheres compared with the MRI of 04/28/2021. These do show mildly restricted diffusion and may represent recent infarcts. Given the numerous lesions, rounded appearance and rapid development also consider demyelinating disease. Repeat MRI with contrast may be helpful in the patient is able to cooperate. Also consider lumbar puncture. Electronically Signed   By: Marlan Palau M.D.   On: 09/20/2021 15:29    Medications: Prior to Admission:  Medications Prior to Admission  Medication Sig Dispense Refill Last Dose   amLODipine (NORVASC) 10 MG tablet Take 1 tablet (10 mg total) by mouth daily. 90 tablet 3 09/04/2021   aspirin EC 81 MG tablet Take 81 mg by mouth daily. Swallow whole.   09/04/2021   clopidogrel (PLAVIX) 75 MG tablet Take 1 tablet (75 mg total) by mouth daily. 30 tablet 0 09/04/2021 at 0730   glipiZIDE (GLUCOTROL XL) 10 MG 24 hr tablet Take 1 tablet (10 mg total) by mouth daily with breakfast. 90 tablet 1 09/03/2021   levocetirizine (XYZAL) 5 MG tablet Take 1 tablet by mouth at bedtime.   09/03/2021   lisinopril (PRINIVIL,ZESTRIL) 40 MG tablet Take 1 tablet (40 mg total) by mouth daily. 90 tablet 3 09/04/2021   metFORMIN (GLUCOPHAGE-XR) 500 MG 24 hr tablet Take 500-1,000 mg by mouth in the morning and at bedtime.   09/04/2021   rosuvastatin (CRESTOR) 40 MG tablet Take 1 tablet (40 mg total) by mouth daily. 30 tablet 1 09/04/2021   tamsulosin  (FLOMAX) 0.4 MG CAPS capsule Take 0.4 mg by mouth daily.   09/03/2021   traZODone (DESYREL) 150 MG tablet Take 150 mg by mouth at bedtime.   09/03/2021   TRESIBA FLEXTOUCH 200 UNIT/ML FlexTouch Pen Inject 20 Units into the skin in the morning and at bedtime.   09/04/2021   BD VEO INSULIN SYRINGE U/F 31G X 15/64" 1 ML MISC  USE AS DIRECTED 100 each 2    glucose blood (ONETOUCH VERIO) test strip TEST twice a day 100 each 3    Insulin Pen Needle (NOVOTWIST) 32G X 5 MM MISC Use two daily to inject Victoza and Toujeo. 90 each 5    ONETOUCH DELICA LANCETS 33G MISC Use to check blood sugar once a day dx code E11.65 50 each 3    Scheduled:  amLODipine  10 mg Per Tube Daily   Chlorhexidine Gluconate Cloth  6 each Topical Daily   docusate  100 mg Per Tube Daily   feeding supplement (PROSource TF20)  60 mL Per Tube BID   furosemide  40 mg Per Tube Daily   heparin injection (subcutaneous)  5,000 Units Subcutaneous Q8H   insulin aspart  0-20 Units Subcutaneous Q4H   insulin aspart  8 Units Subcutaneous Q4H   insulin glargine-yfgn  40 Units Subcutaneous Daily   mouth rinse  15 mL Mouth Rinse Q2H   pantoprazole  40 mg Per Tube Daily   polyethylene glycol  17 g Per Tube Daily   rosuvastatin  40 mg Per Tube Daily   sodium chloride flush  10-40 mL Intracatheter Q12H   sodium chloride flush  5 mL Intracatheter Q8H   Zinc Oxide   Topical BID   Continuous:  sodium chloride Stopped (09/15/21 1519)   sodium chloride     dexmedetomidine (PRECEDEX) IV infusion Stopped (09/22/21 0940)   dextrose     feeding supplement (VITAL 1.5 CAL) 60 mL/hr at 09/22/21 1000   phenylephrine (NEO-SYNEPHRINE) Adult infusion Stopped (09/22/21 0201)    CT-scan of the brain: Multiple white matter insult possibly representing infarcts or inflammatory process, chronic small vessel disease   MRI examination of the brain: Numerous round white matter lesions with restricted diffusion, possibly infarcts or demyelinating disease.    Assessment: 61 year old patient with history of stroke in 4/23 with minimal residual deficits, HTN, HLD, DM and recent jejunal bleed, presents with continued altered mental status and concerning multifocal enhancing white matter lesions on MRI that have morphologies and a distribution suggestive of MS. He has no prior history of demyelinating disease and wife states no prior episodes of focal weakness or vision loss. Also with no family history of MS.   - On exam yesterday (Sunday), the patient was able to intermittently follow simple commands and move all extremities nonpurposefully and sometimes purposefully. Today's exam is compromised by recent sedation, but without focal weakness.  - White matter changes on MRI with multiple foci of bland restricted diffusion may represent strokes, although pattern would be unusual. The locations and morphologies of the lesions are more consistent with demyelinating disease.   - MRI brain follow up post-contrast imaging reveals >20 contrast enhancing lesions in the white matter bilaterally. Strongly favor inflammation / active demyelination possibly MS vs ADEM.  - Inflammation favored over infection given that he is afebrile and primarily altered but not ill-appearing therefore no indication for empiric CNS coverage at this time.  - DDx for AMS: Likely has a delirium component to altered mental status given long hospitalization. The acute multifocal enhancing lesions could also be contributing.  - DDx for white matter lesions on MRI: ADEM, multiple sclerosis and cerebral vasculitis (isolated or as a component of a possible systemic vasculitis) are felt to be highest on the DDx.      Recommendations: - Lumbar puncture, send CSF for cell count x 2 tubes, protein, glucose, gram stain, culture, biofire meningitis PCR panel, IgG index, oligoclonal bands - He will also need MRI c and t spine wwo contrast (will not be able to get gad for next 24 hrs). Will need to obtain  Tuesday AM.  - Will consider IV solumedrol if no e/o infection on LP.  - Discussed with CCM during AM rounds      LOS: 18 days   @Electronically  signed: Dr. 09/22/2021  10:21 AM

## 2021-09-22 NOTE — Inpatient Diabetes Management (Signed)
Inpatient Diabetes Program Recommendations  AACE/ADA: New Consensus Statement on Inpatient Glycemic Control (2015)  Target Ranges:  Prepandial:   less than 140 mg/dL      Peak postprandial:   less than 180 mg/dL (1-2 hours)      Critically ill patients:  140 - 180 mg/dL   Lab Results  Component Value Date   GLUCAP 227 (H) 09/22/2021   HGBA1C 6.2 (H) 04/29/2021    Review of Glycemic Control  Latest Reference Range & Units 09/21/21 19:20 09/21/21 23:12 09/22/21 03:22 09/22/21 07:50  Glucose-Capillary 70 - 99 mg/dL 97 161 (H) 182 (H) 227 (H)  (H): Data is abnormally high Current orders for Inpatient glycemic control: Semglee 40 units daily, Novolog 8 units Q4H, Novolog 0-20 units Q4H; Vital @ 60 ml/hr   Inpatient Diabetes Program Recommendations:    Consider increasing Novolog 12 units Q4H for tube feed coverage once steroids added this afternoon (to be stopped or held in the event tube feeds are stopped).   Thanks, Bronson Curb, MSN, RNC-OB Diabetes Coordinator [phone] (8a-5p)

## 2021-09-23 ENCOUNTER — Inpatient Hospital Stay (HOSPITAL_COMMUNITY): Payer: Medicare HMO

## 2021-09-23 DIAGNOSIS — G379 Demyelinating disease of central nervous system, unspecified: Secondary | ICD-10-CM | POA: Diagnosis not present

## 2021-09-23 DIAGNOSIS — M4802 Spinal stenosis, cervical region: Secondary | ICD-10-CM | POA: Diagnosis not present

## 2021-09-23 DIAGNOSIS — K922 Gastrointestinal hemorrhage, unspecified: Secondary | ICD-10-CM | POA: Diagnosis not present

## 2021-09-23 LAB — CRYPTOCOCCAL ANTIGEN, CSF: Crypto Ag: NEGATIVE

## 2021-09-23 LAB — GLUCOSE, CAPILLARY
Glucose-Capillary: 164 mg/dL — ABNORMAL HIGH (ref 70–99)
Glucose-Capillary: 185 mg/dL — ABNORMAL HIGH (ref 70–99)
Glucose-Capillary: 204 mg/dL — ABNORMAL HIGH (ref 70–99)
Glucose-Capillary: 222 mg/dL — ABNORMAL HIGH (ref 70–99)
Glucose-Capillary: 225 mg/dL — ABNORMAL HIGH (ref 70–99)
Glucose-Capillary: 226 mg/dL — ABNORMAL HIGH (ref 70–99)
Glucose-Capillary: 301 mg/dL — ABNORMAL HIGH (ref 70–99)

## 2021-09-23 LAB — BASIC METABOLIC PANEL
Anion gap: 6 (ref 5–15)
BUN: 30 mg/dL — ABNORMAL HIGH (ref 6–20)
CO2: 29 mmol/L (ref 22–32)
Calcium: 8.7 mg/dL — ABNORMAL LOW (ref 8.9–10.3)
Chloride: 114 mmol/L — ABNORMAL HIGH (ref 98–111)
Creatinine, Ser: 0.83 mg/dL (ref 0.61–1.24)
GFR, Estimated: >60 mL/min (ref >60)
Glucose, Bld: 214 mg/dL — ABNORMAL HIGH (ref 70–99)
Potassium: 3.9 mmol/L (ref 3.5–5.1)
Sodium: 149 mmol/L — ABNORMAL HIGH (ref 135–145)

## 2021-09-23 LAB — ANA W/REFLEX IF POSITIVE: Anti Nuclear Antibody (ANA): NEGATIVE

## 2021-09-23 LAB — C3 COMPLEMENT: C3 Complement: 184 mg/dL — ABNORMAL HIGH (ref 82–167)

## 2021-09-23 LAB — PHOSPHORUS: Phosphorus: 3.8 mg/dL (ref 2.5–4.6)

## 2021-09-23 LAB — MAGNESIUM: Magnesium: 2.3 mg/dL (ref 1.7–2.4)

## 2021-09-23 LAB — C4 COMPLEMENT: Complement C4, Body Fluid: 35 mg/dL (ref 12–38)

## 2021-09-23 MED ORDER — MIDAZOLAM HCL 2 MG/2ML IJ SOLN: 2.0000 mg | Freq: Once | INTRAMUSCULAR | Status: AC

## 2021-09-23 MED ORDER — SODIUM CHLORIDE 0.9 % IV SOLN: 250.0000 mg | Freq: Four times a day (QID) | INTRAVENOUS | Status: DC

## 2021-09-23 MED ORDER — MIDAZOLAM HCL 2 MG/2ML IJ SOLN
INTRAMUSCULAR | Status: AC
  Administered 2021-09-23: 1 mg via INTRAVENOUS
  Filled 2021-09-23: qty 2

## 2021-09-23 MED ORDER — FREE WATER
200.0000 mL | Freq: Three times a day (TID) | Status: DC
  Administered 2021-09-23 – 2021-09-24 (×3): 200 mL

## 2021-09-23 MED ORDER — LACTATED RINGERS IV BOLUS: 500.0000 mL | Freq: Every day | INTRAVENOUS | Status: DC | PRN

## 2021-09-23 MED ORDER — INSULIN GLARGINE-YFGN 100 UNIT/ML ~~LOC~~ SOLN
30.0000 [IU] | Freq: Two times a day (BID) | SUBCUTANEOUS | Status: DC
  Administered 2021-09-24: 30 [IU] via SUBCUTANEOUS
  Filled 2021-09-23 (×2): qty 0.3

## 2021-09-23 MED ORDER — INSULIN ASPART 100 UNIT/ML IJ SOLN
16.0000 [IU] | INTRAMUSCULAR | Status: DC
  Administered 2021-09-23 – 2021-09-24 (×7): 16 [IU] via SUBCUTANEOUS

## 2021-09-23 MED ORDER — SODIUM CHLORIDE 0.9 % IV SOLN: 1000.0000 mg | Freq: Every day | INTRAVENOUS | Status: DC

## 2021-09-23 MED ORDER — MIDAZOLAM HCL 2 MG/2ML IJ SOLN
1.0000 mg | Freq: Every day | INTRAMUSCULAR | Status: DC | PRN
  Administered 2021-09-23: 1 mg via INTRAVENOUS

## 2021-09-23 MED ORDER — GADOPICLENOL 0.5 MMOL/ML IV SOLN
10.0000 mL | Freq: Once | INTRAVENOUS | Status: AC | PRN
  Administered 2021-09-23: 10 mL via INTRAVENOUS

## 2021-09-23 MED ORDER — INSULIN GLARGINE-YFGN 100 UNIT/ML ~~LOC~~ SOLN
20.0000 [IU] | Freq: Once | SUBCUTANEOUS | Status: AC
  Administered 2021-09-23: 20 [IU] via SUBCUTANEOUS
  Filled 2021-09-23: qty 0.2

## 2021-09-23 MED ORDER — MIDAZOLAM HCL 2 MG/2ML IJ SOLN
1.0000 mg | Freq: Every day | INTRAMUSCULAR | Status: DC | PRN
  Administered 2021-09-23: 1 mg via INTRAVENOUS
  Filled 2021-09-23: qty 2

## 2021-09-23 MED ORDER — INSULIN GLARGINE-YFGN 100 UNIT/ML ~~LOC~~ SOLN
20.0000 [IU] | Freq: Once | SUBCUTANEOUS | Status: DC
  Filled 2021-09-23: qty 0.2

## 2021-09-23 MED ORDER — SODIUM CHLORIDE 0.9 % IV SOLN
1000.0000 mg | Freq: Every day | INTRAVENOUS | Status: AC
  Administered 2021-09-23 – 2021-09-27 (×5): 1000 mg via INTRAVENOUS
  Filled 2021-09-23 (×5): qty 16

## 2021-09-23 NOTE — Progress Notes (Signed)
Occupational Therapy Treatment Patient Details Name: Troy Randall MRN: [id number] DOB: 1960-03-06 Today's Date: 09/23/2021   History of present illness Patient is a 61 y/o male who presents on 09/04/21 with dark stools, dizziness and SOB. Found to have GI bleed and acute anemia s/p embolization 09/06/21 and percutaneous cholecystostomy 09/07/21. Intubated 9/3, CRRT 9/5-9/8. Hemorrhagic and septic shock 9/5. Trach placed 9/11. 9/16 MRI rapid development numerous round white matter lesions in both cerebral hemispheres; 9/17 MRI: numerous white matter lesions consistent with active demyelination - concerning for MS, ADEM, and cerebral vasculitis; 9/18 Lumbar puncture. PMH includes CVA, DM, HTN.   OT comments  Patient participated with therapy today with patient demonstrating use of RUE with washing face and able to use LUE to push self up from lateral sitting on elbow. Patient following limited commands and able to track with eyes with cueing. Acute OT to continue to follow.    Recommendations for follow up therapy are one component of a multi-disciplinary discharge planning process, led by the attending physician.  Recommendations may be updated based on patient status, additional functional criteria and insurance authorization.    Follow Up Recommendations  OT at Long-term acute care hospital    Assistance Recommended at Discharge Frequent or constant Supervision/Assistance  Patient can return home with the following  Two people to help with walking and/or transfers;Two people to help with bathing/dressing/bathroom;Direct supervision/assist for medications management;Direct supervision/assist for financial management;Assist for transportation;Help with stairs or ramp for entrance;Assistance with feeding;Assistance with cooking/housework   Equipment Recommendations  Other (comment) (defer to next venue)    Recommendations for Other Services      Precautions / Restrictions  Precautions Precautions: Fall Precaution Comments: TC. ,mitts, cortrak Restrictions Weight Bearing Restrictions: No       Mobility Bed Mobility Overal bed mobility: Needs Assistance Bed Mobility: Rolling, Sidelying to Sit, Sit to Sidelying Rolling: Max assist, +2 for physical assistance Sidelying to sit: Max assist, +2 for physical assistance     Sit to sidelying: Max assist, +2 for physical assistance General bed mobility comments: verbal and tactile cues to participate with max assist    Transfers                   General transfer comment: unable to attempt standing due to not following commands/weak; focused on EOB balance     Balance Overall balance assessment: Needs assistance Sitting-balance support: Single extremity supported, Bilateral upper extremity supported, Feet supported Sitting balance-Leahy Scale: Poor Sitting balance - Comments: able to sit on EOB for 15-20 minutes with work on trunk activation, balance, and posture. Patient demonstrated pushing with RUE and was able to correct with hand in lap.       Standing balance comment: deferred                           ADL either performed or assessed with clinical judgement   ADL Overall ADL's : Needs assistance/impaired     Grooming: Wash/dry face;Moderate assistance;Sitting Grooming Details (indicate cue type and reason): required mod assist to initate washing face and was able to continue without assistance                               General ADL Comments: washed face seated on EOB    Extremity/Trunk Assessment Upper Extremity Assessment RUE Deficits / Details: Used RUE to wash face LUE Deficits / Details:  pushed up from bed with LUE            Vision       Perception     Praxis      Cognition Arousal/Alertness: Awake/alert (eyes open) Behavior During Therapy: Flat affect Overall Cognitive Status: Difficult to assess                                  General Comments: followed some dirctions for grooming, tracked with eyes with cueing        Exercises      Shoulder Instructions       General Comments VSS on trach collar 28% FiO2 (BP stable in all positions).    Pertinent Vitals/ Pain       Pain Assessment Pain Assessment: Faces Faces Pain Scale: No hurt Pain Intervention(s): Monitored during session  Home Living                                          Prior Functioning/Environment              Frequency  Min 2X/week        Progress Toward Goals  OT Goals(current goals can now be found in the care plan section)  Progress towards OT goals: Progressing toward goals  Acute Rehab OT Goals OT Goal Formulation: With family Time For Goal Achievement: 10/01/21 Potential to Achieve Goals: Fair ADL Goals Pt/caregiver will Perform Home Exercise Program: Increased strength;Both right and left upper extremity;With minimal assist Additional ADL Goal #1: Pt will follow one step commands 50% of time. Additional ADL Goal #2: Pt will demonstrate fair sitting balance at EOB x 5 minutes as a precursor to ADLs. Additional ADL Goal #3: Pt will roll with moderate assistance for pressure relief and pericare.  Plan Discharge plan remains appropriate    Co-evaluation    PT/OT/SLP Co-Evaluation/Treatment: Yes Reason for Co-Treatment: For patient/therapist safety;To address functional/ADL transfers;Complexity of the patient's impairments (multi-system involvement) PT goals addressed during session: Mobility/safety with mobility;Balance OT goals addressed during session: ADL's and self-care      AM-PAC OT "6 Clicks" Daily Activity     Outcome Measure   Help from another person eating meals?: Total Help from another person taking care of personal grooming?: A Lot Help from another person toileting, which includes using toliet, bedpan, or urinal?: Total Help from another person bathing (including  washing, rinsing, drying)?: Total Help from another person to put on and taking off regular upper body clothing?: Total Help from another person to put on and taking off regular lower body clothing?: Total 6 Click Score: 7    End of Session Equipment Utilized During Treatment: Oxygen  OT Visit Diagnosis: Muscle weakness (generalized) (M62.81);Other symptoms and signs involving cognitive function;Low vision, both eyes (H54.2)   Activity Tolerance Patient tolerated treatment well   Patient Left in bed;with call bell/phone within reach;with bed alarm set;with family/visitor present   Nurse Communication Mobility status        Time: 4967-5916 OT Time Calculation (min): 26 min  Charges: OT General Charges $OT Visit: 1 Visit OT Treatments $Self Care/Home Management : 8-22 mins  Alfonse Flavors, OTA Acute Rehabilitation Services  Office [phone]   Dewain Penning 09/23/2021, 1:12 PM

## 2021-09-23 NOTE — Progress Notes (Signed)
Physical Therapy Treatment Patient Details Name: Troy Randall MRN: [id number] DOB: 03-20-1960 Today's Date: 09/23/2021   History of Present Illness Patient is a 61 y/o male who presents on 09/04/21 with dark stools, dizziness and SOB. Found to have GI bleed and acute anemia s/p embolization 09/06/21 and percutaneous cholecystostomy 09/07/21. Intubated 9/3, CRRT 9/5-9/8. Hemorrhagic and septic shock 9/5. Trach placed 9/11. 9/16 MRI rapid development numerous round white matter lesions in both cerebral hemispheres; 9/17 MRI: numerous white matter lesions consistent with active demyelination - concerning for MS, ADEM, and cerebral vasculitis; 9/18 Lumbar puncture. PMH includes CVA, DM, HTN.    PT Comments    Pt with gradual progress.  He had improved participation and following commands at EOB but still limited.  Session focused on EOB balance, truncal activation, and reaching midline.  VSS throughout session.  Pt following limited commands and poor balance so did not attempt standing today.  Consider progression with STEDY for standing next visit.    Recommendations for follow up therapy are one component of a multi-disciplinary discharge planning process, led by the attending physician.  Recommendations may be updated based on patient status, additional functional criteria and insurance authorization.  Follow Up Recommendations  PT at Long-term acute care hospital     Assistance Recommended at Discharge Frequent or constant Supervision/Assistance  Patient can return home with the following Two people to help with walking and/or transfers;Two people to help with bathing/dressing/bathroom;Assistance with cooking/housework;Assist for transportation;Help with stairs or ramp for entrance   Equipment Recommendations  Other (comment) (TBD)    Recommendations for Other Services       Precautions / Restrictions Precautions Precautions: Fall Precaution Comments: TC. ,mitts, cortrak     Mobility   Bed Mobility Overal bed mobility: Needs Assistance Bed Mobility: Rolling, Sidelying to Sit, Sit to Sidelying Rolling: Max assist, +2 for physical assistance Sidelying to sit: Max assist, +2 for physical assistance     Sit to sidelying: Max assist, +2 for physical assistance General bed mobility comments: Cued for direction but needing max A to complete    Transfers                   General transfer comment: unable to attempt standing due to not following commands/weak; focused on EOB balance    Ambulation/Gait                   Stairs             Wheelchair Mobility    Modified Rankin (Stroke Patients Only)       Balance Overall balance assessment: Needs assistance Sitting-balance support: Single extremity supported, Bilateral upper extremity supported, Feet supported Sitting balance-Leahy Scale: Poor Sitting balance - Comments: EOB for 15-20 mins with work on truncal activation.  Overall pt requiring mod A for balance with tendency to lean R.  Performed leaning onto L elbow 3 x and pt would attempt to sit back up then could maintain balance for a few seconds with min guard.  Tried positioning L hand in lap and stablizing R hand on bed.  Also focused on rotating trunk and head to neutral (from R) - pt tight and unable to progress to L rotation.  Tried visual and verbal stimuli from therapist and wife to encourage looking L.       Standing balance comment: deferred  Cognition Arousal/Alertness: Awake/alert (eyes open) Behavior During Therapy: Flat affect Overall Cognitive Status: Difficult to assess                                 General Comments: Pt tracked with eyes with verbal stimuli; followed some commands (washed face when handed wet rag); no verbal or yes/no nods; turns R with increased difficulty attending to L        Exercises Other Exercises Other Exercises: Pt with spontaneous  movement of legs in supine.  Did LAQ x 5 with AAROM therapist initiating movement.    General Comments General comments (skin integrity, edema, etc.): VSS on trach collar 28% FiO2 (BP stable in all positions).      Pertinent Vitals/Pain Pain Assessment Pain Assessment: Faces Faces Pain Scale: No hurt    Home Living                          Prior Function            PT Goals (current goals can now be found in the care plan section) Progress towards PT goals: Progressing toward goals    Frequency    Min 2X/week      PT Plan Current plan remains appropriate    Co-evaluation PT/OT/SLP Co-Evaluation/Treatment: Yes Reason for Co-Treatment: For patient/therapist safety;Complexity of the patient's impairments (multi-system involvement) PT goals addressed during session: Mobility/safety with mobility;Balance        AM-PAC PT "6 Clicks" Mobility   Outcome Measure  Help needed turning from your back to your side while in a flat bed without using bedrails?: Total Help needed moving from lying on your back to sitting on the side of a flat bed without using bedrails?: Total Help needed moving to and from a bed to a chair (including a wheelchair)?: Total Help needed standing up from a chair using your arms (e.g., wheelchair or bedside chair)?: Total Help needed to walk in hospital room?: Total Help needed climbing 3-5 steps with a railing? : Total 6 Click Score: 6    End of Session   Activity Tolerance: Patient tolerated treatment well Patient left: in bed;with call bell/phone within reach;with family/visitor present Nurse Communication: Mobility status PT Visit Diagnosis: Muscle weakness (generalized) (M62.81);Other symptoms and signs involving the nervous system (R29.898)     Time: [id number] PT Time Calculation (min) (ACUTE ONLY): 28 min  Charges:  $Neuromuscular Re-education: 8-22 mins                     Abran Richard, PT Acute Rehab [facility]  [phone]    Karlton Lemon 09/23/2021, 12:08 PM

## 2021-09-23 NOTE — Progress Notes (Signed)
Speech Language Pathology Treatment: [name] Speaking valve  Patient Details Name: Troy Randall MRN: [id number] DOB: [birth year] Today's Date: 09/23/2021 Time: 5009-3818 SLP Time Calculation (min) (ACUTE ONLY): 27 min  Assessment / Plan / Recommendation Clinical Impression  Pt upright at edge of bed with PT/OT upon entering. He is awake, however, made no attempts to communicate throughout session. Consistent with previous encounter, continous non-purposeful lower limb movement was noted. Excessive secretions noted in oral cavity and oral care was completed. PMV was donned. Pt cognitively unable to phonate upon command and produced one instance of reflexive vocalization. Vital signs remained stable throughout session, no evidence of air trapping noted. Wife educated on the purpose of the valve and the pt's cognitive needs. PMV use with SLP only. SLP will f/u for further PMV trials and bedside when appropriate.    HPI HPI: Pt is a 61 y.o. male with history of stroke in 4/23, HTN, HLD and DM with recent severe jejunal bleed s/p vessel coiling who is being seen for altered mental status and concerning white matter changes on MRI. Patient's wife reports that after his stroke in 4/23 he made a good recovery and was able to perform ADLs. He had some residual dysarthria. Pt presented to [facility] on 8/31 with GI bleeding and has remained persistently altered. Intubated 9/3 and trach placed 9/11.      SLP Plan  Continue with current plan of care      Recommendations for follow up therapy are one component of a multi-disciplinary discharge planning process, led by the attending physician.  Recommendations may be updated based on patient status, additional functional criteria and insurance authorization.    Recommendations         Patient may use Passy-Muir Speech Valve: with SLP only PMSV Supervision: Full         Oral Care Recommendations: Oral care QID Follow Up Recommendations:  SLP at Long-term acute care hospital Assistance recommended at discharge: Frequent or constant Supervision/Assistance SLP Visit Diagnosis: Aphonia (R49.1) Plan: Continue with current plan of care           [facility] Student SLP   09/23/2021, 11:54 AM

## 2021-09-23 NOTE — TOC Progression Note (Signed)
Transition of Care [facility]) - Initial/Assessment Note    Patient Details  Name: Troy Randall MRN: [id number] Date of Birth: [birth year]  Transition [facility] CM/SW Contact:    Milinda Antis, LCSWA Phone Number: 09/23/2021, 1:12 PM  Clinical Narrative:                 CSW contacted [facility] with [facility].  Insurance Josem Kaufmann is still pending.  TOC will continue to follow.     Barriers to Discharge: Continued Medical Work up   Patient Goals and CMS Choice Patient states their goals for this hospitalization and ongoing recovery are:: To return home      Expected Discharge Plan and Services     Discharge Planning Services: CM Consult   Living arrangements for the past 2 months: Single Family Home                                      Prior Living Arrangements/Services Living arrangements for the past 2 months: Single Family Home Lives with:: Spouse Patient language and need for interpreter reviewed:: Yes Do you feel safe going back to the place where you live?: Yes      Need for Family Participation in Patient Care: Yes (Comment) Care giver support system in place?: Yes (comment) Current home services: DME (All necessary DME's) Criminal Activity/Legal Involvement Pertinent to Current Situation/Hospitalization: No - Comment as needed  Activities of Daily Living Home Assistive Devices/Equipment: CBG Meter ADL Screening (condition at time of admission) Patient's cognitive ability adequate to safely complete daily activities?: Yes Is the patient deaf or have difficulty hearing?: No Does the patient have difficulty seeing, even when wearing glasses/contacts?: No Does the patient have difficulty concentrating, remembering, or making decisions?: No Patient able to express need for assistance with ADLs?: Yes Does the patient have difficulty dressing or bathing?: No Independently performs ADLs?: Yes (appropriate for developmental age) Does the patient have  difficulty walking or climbing stairs?: No Weakness of Legs: None Weakness of Arms/Hands: None  Permission Sought/Granted Permission sought to share information with : Case Manager, Family Supports Permission granted to share information with : Yes, Verbal Permission Granted              Emotional Assessment Appearance:: Appears stated age Attitude/Demeanor/Rapport: Unable to Assess (Intubated) Affect (typically observed): Unable to Assess (Intubated) Orientation: : Oriented to Self Alcohol / Substance Use: Not Applicable Psych Involvement: No (comment)  Admission diagnosis:  GI bleed [K92.2] Lower GI bleed [K92.2] Patient Active Problem List   Diagnosis Date Noted   Anemia due to blood loss 09/18/2021   History of lower GI bleeding: 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  09/18/2021   H/O ischemic bowel disease 09/18/2021   Acute emphysematous cholecystitis 09/18/2021   Chronic respiratory failure with hypoxia ([place])    Tracheostomy status ([place])    Acute encephalopathy    Acute CVA (cerebrovascular accident) ([place]) 04/28/2021   Cannot sleep 08/26/2015   Chronic right-sided low back pain with sciatica 07/10/2015   Uncontrolled type 2 diabetes mellitus with hyperglycemia, without long-term current use of insulin ([place]) 06/19/2015   Hyperlipidemia 11/29/2013   Essential hypertension 11/28/2013   PCP:  [name], MD Pharmacy:   [place], [place] - [address] [place] [address] Phone: [phone] Fax: [phone]     Social Determinants  of Health (SDOH) Interventions    Readmission Risk Interventions     No data to display

## 2021-09-23 NOTE — Progress Notes (Signed)
NAME:  Troy Randall, MRN:  [id number], DOB:  October 26, 1960, LOS: 38 ADMISSION DATE:  09/04/2021, CONSULTATION DATE:  09/06/21 REFERRING MD:  [name], CHIEF COMPLAINT:  melena   History of Present Illness:   Troy Randall is an 61 y.o. male with prior history of HTN, HLD, DM, and CVA on ASA/ plavix who presented to [facility] on 8/31 after developing frequent 6-7 dark stools and feeling dizzy and lightheaded on standing.  No hx of NSAIDs.  FOBT positive and initial Hgb 12.2.    Patient admitted to [facility] with GI consulting.  Remained orthostatic with progressive decline in Hgb.  CTA abdomen did not show any active bleed, therefore underwent colonoscopy > no active bleed found but large clots throughout entire colon.  Then underwent EGD which showed a small hiatal hernia, erosive gastropathy with no stigmata of recent bleeding, and non-bleeding duodenal diverticulum.   Underwent then small bowel enteroscopy which noted intermittent blood pumping in the proximal jejunum but was unable to be reached despite multiple attempts, was injected and clip placed.  Repeat CTA repeated, which showed positive jejunal diverticular bleeding. Patient was transferred to [facility] for IR involvement. In IR active extravasation  at SMA territory, jejunal arcade branch, successful coil embolization.  PCCM was consulted for transfer and admission.  Pertinent  Medical History  HTN, HLD, DM, CVA on ASA/ plavix  Significant Hospital Events: Including procedures, antibiotic start and stop dates in addition to other pertinent events   8/31 presented to [facility] ED, [name] Admit, Gi Consult 9/1 CTA> no evidence of active GI bleed, 2 U PRBC 9/2 2 U PRBC, colonoscopy > no active bleed found but large clots throughout entire colon. EGD> small hiatal hernia, erosive gastropathy with no stigmata of recent bleeding, and non-bleeding duodenal diverticulum.   Underwent then small bowel enteroscopy which noted intermittent blood pumping in the proximal jejunum  but was unable to be reached despite multiple attempts, was injected and clip placed. Repeat CTA repeated, which showed positive jejunal diverticular bleeding. IR> active extrav at [facility] territory, jejunal arcade branch, successful coil embo 9/3 Intubated. CTA> no evidence of active GI bleeding.  New finding of abnormal gallbladder with nondependent air, gallbladder wall air, pericholecystic and right upper quadrant information.  IR placed percutaneous cholecystotomy tube.  9/4 R. IJ central line placed with dialysis triple lumen catheter 9/5 CRRT initiated 9/6 Bedside EGD without any evidence of bleeding. CTA a/p without etiology for bleed 9/8 CRRT discontinued. Required ETT exchange yesterday due to blown cuff. 9/11 Bronchoscopic guided percutaneous tracheostomy placement.  Right IJ central line removed. 9/15 flexible sigmoidoscopy with removal of endoscopy 9/16 MRI brain w/o contrast amended study secondary to range of motion.  Rapid development numerous round white matter lesions in both cerebral hemispheres.  Prior MRIs. 9/17 MRI brain with contrast numerous contrast-enhancing bilateral white matter lesions consistent with active demyelination 9/18 Lumbar puncture  Interim History / Subjective:  Yesterday afternoon LP performed without complications, he did require 2 of Versed.    Overnight he did not require any sedatives or vasopressors.  Per wife he did follow some commands yesterday evening.  Objective   Blood pressure (!) 143/81, pulse 88, temperature 98.2 F (36.8 C), temperature source Axillary, resp. rate (!) 22, height 6' (1.829 m), weight 120.3 kg, SpO2 96 %. FiO2 (%):  [28 %] 28 %   Intake/Output Summary (Last 24 hours) at 09/23/2021 0636 Last data filed at 09/23/2021 0600 Gross per 24 hour  Intake 1909.4 ml  Output  2100 ml  Net -190.6 ml    Filed Weights   09/20/21 0500 09/21/21 0500 09/23/21 0500  Weight: 125.6 kg 122.1 kg 120.3 kg   Physical Exam: General: Chronically  ill-appearing, no acute distress HENT: [place], AT, MV via tracheostomy Eyes: Eyes open, not tracking.   Respiratory: Clear to auscultation bilaterally. Cardiovascular: RRR, -M/R/G, no JVD GI: BS+, soft, non-tender. Percutaneous chole drain in place.  Extremities: Trace edema of lower extremities Neuro: Does not follow commands, moving all 4 extremities sporadically. GU: External catheter  Resolved Hospital Problem list   AKI - off CRRT Septic shock Jejunal diverticular bleed status postembolization Assessment & Plan:   Acute encephalopathy, possibly secondary to hypoxic-ischemic encephalopathy History of CVA without residual deficits, concern for recurrence Not following commands this morning.  LP performed yesterday awaiting results.  No concerns for infectious etiology start IV Solu-Medrol today.  Continues to remain afebrile.  We will also have MRI of cervical and thoracic spine today to assess for further lesions. -Neurology following appreciate their assistance -IV Solu-Medrol for 5 days -Lumbar puncture 9/18, awaiting lab results.  -MR C-spine and T-spine this morning, precedex and versed as needed -TSH, B12 normal.  B1 pending -Restart aspirin, heparin. His CVA was in 04/2021 and no new CVA. Will not restart plavix -Continue crestor  Acute hypoxemic respiratory failure s/p percutaneous tracheostomy 9/11 Tracheostomy in place without complications -Minimize sedation and treat encephalopathy as per above  Hemorrhagic shock - resolved ABLA secondary to lower GI bleed, jejunal diverticular and ulcers secondary to ischemia Acute blood loss and iron deficiency anemia, secondary to above - 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 and bedside EGD 9/6. Endoscopy capsule removal 9/15 No episodes of melena -MWF CBC -Transfuse PRBC if HBG less than 7  Ecoli Cholecystitis s/p percutaneous cholecystostomy drain placement 9/3 percutaneous cholecystostomy drain placed and  continues to have adequate drainage. -Completed total 7d of antibiotics: Ceftriaxone  -Continue to monitor drain -Trend fever/WBC -Will need outpatient surgery and IR follow up  AKI  - off CRRT 9/8 Bilateral nonobstructing nephrolithiasis - suspect chronic BPH Hypernatremia Creatinine stable. Sodium stable at 149, continue to hold lasix.  -Labs every Monday Wednesday Friday -Nephrology no longer following -hold lasix with hypernatremia. -Trend electrolytes, replete as needed -Strict I/O  DM2 Current regimen of SSI, novolog 8 u q4h, and 40 U glargine.  Adjust as needed with initiation of steroids.  -Blood Glucose goal 140-180 -Adjust as needed to meet goal above  HTN HLD -Amlodipine 10 mg daily -Hold home lisinopril  -Continue crestor   Pulmonary nodules in bilateral bases, L 26mm Family reports patient is a never smoker. Family does endorse dipping. -no further follow up needed for a low risk patient based on fleischner criteria  He is currently medically stable and not requiring further ICU interventions. Will plan to transfer out of ICU today  Best Practice    Diet/type: TF DVT prophylaxis: heparin  GI prophylaxis: PPI Lines: Foley, PICC RUE Code Status:  full code Last date of multidisciplinary goals of care discussion [Full scope- Wife updated at bedside this morning  Thalia Bloodgood DO  Internal Medicine Resident PGY-3 [place]  Pager: [phone]

## 2021-09-23 NOTE — Progress Notes (Signed)
ICU Pharmacy Insulin Protocol   CBGs above goal 140-180 mg/dL: Yes > 180    Current regimen: on resistant SSI (24 units in last 24 hours), lantus 40 daily + Novolog 12 Q4H (SSI 43u/24h)    Medications affecting CBG levels: Tube feeds running at goal, adding solu-medrol 1000mg  QD for autoimmune encephalitis    Plan: Increase tube feed coverage to 16 units.  Increase lantus to 30units BID.  Patient received 40 units of lantus this AM, timed 20units x 1 this afternoon after addition of steroids.

## 2021-09-23 NOTE — Inpatient Diabetes Management (Signed)
Inpatient Diabetes Program Recommendations  AACE/ADA: New Consensus Statement on Inpatient Glycemic Control (2015)  Target Ranges:  Prepandial:   less than 140 mg/dL      Peak postprandial:   less than 180 mg/dL (1-2 hours)      Critically ill patients:  140 - 180 mg/dL   Lab Results  Component Value Date   GLUCAP 222 (H) 09/23/2021   HGBA1C 6.2 (H) 04/29/2021    Review of Glycemic Control  Latest Reference Range & Units 09/22/21 19:30 09/22/21 23:23 09/23/21 03:45 09/23/21 07:58  Glucose-Capillary 70 - 99 mg/dL 235 (H) 284 (H) 185 (H) 222 (H)  (H): Data is abnormally high Current orders for Inpatient glycemic control: Semglee 40 units daily, Novolog 12 units Q4H, Novolog 0-20 units Q4H; Vital @ 60 ml/hr Solumedrol ?   Inpatient Diabetes Program Recommendations:    Consider: -Increasing Novolog 16 units Q4H for tube feed coverage (to be stopped or held in the event tube feeds are stopped). -Increasing Semglee 30 units BID.   Thanks, Bronson Curb, MSN, RNC-OB Diabetes Coordinator [phone] (8a-5p)

## 2021-09-23 NOTE — Progress Notes (Signed)
Subjective: Awake and lying comfortably in bed. Wife is at the bedside.   Objective: Current vital signs: BP 133/86   Pulse 95   Temp 98.7 F (37.1 C) (Oral)   Resp 18   Ht 6' (1.829 m)   Wt 120.3 kg   SpO2 97%   BMI 35.97 kg/m  Vital signs in last 24 hours: Temp:  [97.5 F (36.4 C)-98.8 F (37.1 C)] 98.7 F (37.1 C) (09/19 0755) Pulse Rate:  [62-116] 95 (09/19 0800) Resp:  [15-28] 18 (09/19 0800) BP: (94-164)/(59-102) 133/86 (09/19 0800) SpO2:  [92 %-100 %] 97 % (09/19 0745) FiO2 (%):  [28 %] 28 % (09/19 0800) Weight:  [120.3 kg] 120.3 kg (09/19 0500)  Intake/Output from previous day: 09/18 0701 - 09/19 0700 In: 1830.5 [I.V.:50.5; NG/GT:1530; IV Piggyback:250] Out: 2100 [Urine:2050; Drains:50] Intake/Output this shift: Total I/O In: 120 [NG/GT:120] Out: -  Nutritional status:  Diet Order             Diet NPO time specified  Diet effective now                  HEENT: [place]/AT. Trach collar is noted.  Ext: No edema   Neurologic Exam: Ment: Awake with eyes half-open. Does not fixate or track for most of the exam but did briefly look at wife when she spoke to him. Follows some simple commands with increased latencies of motor responses. Does not attempt to speak. Able to move all extremities semipurposefully as he engages in fidgeting/restless movements in the bed. He did wave "bye" with his right hand when examiner was preparing to leave the room.   CN: Pupils 3 mm and reactive. Face is symmetric but with decreased tone to mouth/jaw.   Motor/Sensory: Weak movement of extremities to noxious without asymmetry. Follows some simple commands with increased latencies of motor responses, but does not follow commands for more detailed strength testing.  Reflexes: Low-amplitude 3+ brachioradialis and patellar reflexes Cerebellar/Gait: Unable to assess    Lab Results: Results for orders placed or performed during the hospital encounter of 09/04/21 (from the past 48  hour(s))  Basic metabolic panel     Status: Abnormal   Collection Time: 09/21/21 10:08 AM  Result Value Ref Range   Sodium 147 (H) 135 - 145 mmol/L   Potassium 4.2 3.5 - 5.1 mmol/L   Chloride 114 (H) 98 - 111 mmol/L   CO2 25 22 - 32 mmol/L   Glucose, Bld 323 (H) 70 - 99 mg/dL    Comment: Glucose reference range applies only to samples taken after fasting for at least 8 hours.   BUN 24 (H) 6 - 20 mg/dL   Creatinine, Ser 1.61 0.61 - 1.24 mg/dL   Calcium 8.6 (L) 8.9 - 10.3 mg/dL   GFR, Estimated >09 >60 mL/min    Comment: (NOTE) Calculated using the CKD-EPI Creatinine Equation (2021)    Anion gap 8 5 - 15    Comment: Performed at [facility], [address], [place], [place] [address]  Magnesium     Status: None   Collection Time: 09/21/21 10:08 AM  Result Value Ref Range   Magnesium 2.2 1.7 - 2.4 mg/dL    Comment: Performed at [facility], [address] [place], [place] [address]  Phosphorus     Status: None   Collection Time: 09/21/21 10:08 AM  Result Value Ref Range   Phosphorus 3.8 2.5 - 4.6 mg/dL    Comment: Performed at  [facility], [address] [place] [place], [place] [address]  CBC with Differential/Platelet     Status: Abnormal   Collection Time: 09/21/21 10:08 AM  Result Value Ref Range   WBC 10.0 4.0 - 10.5 K/uL   RBC 3.92 (L) 4.22 - 5.81 MIL/uL   Hemoglobin 11.2 (L) 13.0 - 17.0 g/dL   HCT 38.4 (L) 66.5 - 99.3 %   MCV 91.1 80.0 - 100.0 fL   MCH 28.6 26.0 - 34.0 pg   MCHC 31.4 30.0 - 36.0 g/dL   RDW 57.0 17.7 - 93.9 %   Platelets 366 150 - 400 K/uL   nRBC 0.0 0.0 - 0.2 %   Neutrophils Relative % 82 %   Neutro Abs 8.2 (H) 1.7 - 7.7 K/uL   Lymphocytes Relative 8 %   Lymphs Abs 0.8 0.7 - 4.0 K/uL   Monocytes Relative 7 %   Monocytes Absolute 0.7 0.1 - 1.0 K/uL   Eosinophils Relative 2 %   Eosinophils Absolute 0.2 0.0 - 0.5 K/uL   Basophils Relative 1 %   Basophils Absolute 0.1 0.0 - 0.1 K/uL   Immature Granulocytes 0 %   Abs Immature  Granulocytes 0.03 0.00 - 0.07 K/uL    Comment: Performed at [facility], [address] [place], [place] [address]  Glucose, capillary     Status: Abnormal   Collection Time: 09/21/21 12:16 PM  Result Value Ref Range   Glucose-Capillary 343 (H) 70 - 99 mg/dL    Comment: Glucose reference range applies only to samples taken after fasting for at least 8 hours.  Glucose, capillary     Status: Abnormal   Collection Time: 09/21/21  3:34 PM  Result Value Ref Range   Glucose-Capillary 218 (H) 70 - 99 mg/dL    Comment: Glucose reference range applies only to samples taken after fasting for at least 8 hours.  Glucose, capillary     Status: None   Collection Time: 09/21/21  7:20 PM  Result Value Ref Range   Glucose-Capillary 97 70 - 99 mg/dL    Comment: Glucose reference range applies only to samples taken after fasting for at least 8 hours.  Glucose, capillary     Status: Abnormal   Collection Time: 09/21/21 11:12 PM  Result Value Ref Range   Glucose-Capillary 161 (H) 70 - 99 mg/dL    Comment: Glucose reference range applies only to samples taken after fasting for at least 8 hours.  Glucose, capillary     Status: Abnormal   Collection Time: 09/22/21  3:22 AM  Result Value Ref Range   Glucose-Capillary 182 (H) 70 - 99 mg/dL    Comment: Glucose reference range applies only to samples taken after fasting for at least 8 hours.  Vitamin B12     Status: Abnormal   Collection Time: 09/22/21  3:25 AM  Result Value Ref Range   Vitamin B-12 1,427 (H) 180 - 914 pg/mL    Comment: (NOTE) This assay is not validated for testing neonatal or myeloproliferative syndrome specimens for Vitamin B12 levels. Performed at [facility], [address], [place], [place] [address]   TSH     Status: None   Collection Time: 09/22/21  3:25 AM  Result Value Ref Range   TSH 1.996 0.350 - 4.500 uIU/mL    Comment: Performed by a 3rd Generation assay with a functional sensitivity of <=0.01  uIU/mL. Performed at [facility], [address] [place], [place]  [address]   Glucose, capillary     Status: Abnormal   Collection Time: 09/22/21  7:50 AM  Result Value Ref Range   Glucose-Capillary 227 (H) 70 - 99 mg/dL    Comment: Glucose reference range applies only to samples taken after fasting for at least 8 hours.  Basic metabolic panel     Status: Abnormal   Collection Time: 09/22/21  9:37 AM  Result Value Ref Range   Sodium 149 (H) 135 - 145 mmol/L   Potassium 4.2 3.5 - 5.1 mmol/L   Chloride 113 (H) 98 - 111 mmol/L   CO2 29 22 - 32 mmol/L   Glucose, Bld 250 (H) 70 - 99 mg/dL    Comment: Glucose reference range applies only to samples taken after fasting for at least 8 hours.   BUN 31 (H) 6 - 20 mg/dL   Creatinine, Ser 8.34 0.61 - 1.24 mg/dL   Calcium 8.8 (L) 8.9 - 10.3 mg/dL   GFR, Estimated >19 >62 mL/min    Comment: (NOTE) Calculated using the CKD-EPI Creatinine Equation (2021)    Anion gap 7 5 - 15    Comment: Performed at [facility], [address], [place], [place] [address]  Magnesium     Status: None   Collection Time: 09/22/21  9:37 AM  Result Value Ref Range   Magnesium 2.2 1.7 - 2.4 mg/dL    Comment: Performed at [facility], [address] [place], [place] [address]  Phosphorus     Status: None   Collection Time: 09/22/21  9:37 AM  Result Value Ref Range   Phosphorus 3.9 2.5 - 4.6 mg/dL    Comment: Performed at [facility], [address], [place], [place] [address]  CBC with Differential/Platelet     Status: Abnormal   Collection Time: 09/22/21  9:38 AM  Result Value Ref Range   WBC 8.4 4.0 - 10.5 K/uL   RBC 3.54 (L) 4.22 - 5.81 MIL/uL   Hemoglobin 10.1 (L) 13.0 - 17.0 g/dL   HCT 08.1 (L) 44.8 - 18.5 %   MCV 91.2 80.0 - 100.0 fL   MCH 28.5 26.0 - 34.0 pg   MCHC 31.3 30.0 - 36.0 g/dL   RDW 63.1 49.7 - 02.6 %   Platelets 264 150 - 400 K/uL   nRBC 0.0 0.0 - 0.2 %   Neutrophils Relative % 78 %   Neutro Abs 6.6 1.7 - 7.7  K/uL   Lymphocytes Relative 11 %   Lymphs Abs 0.9 0.7 - 4.0 K/uL   Monocytes Relative 6 %   Monocytes Absolute 0.5 0.1 - 1.0 K/uL   Eosinophils Relative 3 %   Eosinophils Absolute 0.2 0.0 - 0.5 K/uL   Basophils Relative 1 %   Basophils Absolute 0.1 0.0 - 0.1 K/uL   Immature Granulocytes 1 %   Abs Immature Granulocytes 0.04 0.00 - 0.07 K/uL    Comment: Performed at [facility], [address], [place], [place] [address]  Glucose, capillary     Status: Abnormal   Collection Time: 09/22/21 11:28 AM  Result Value Ref Range   Glucose-Capillary 267 (H) 70 - 99 mg/dL    Comment: Glucose reference range applies only to samples taken after fasting for at least 8 hours.   Comment 1 Notify RN    Comment 2 Document in Chart   Meningitis/Encephalitis Panel (CSF)     Status: None   Collection Time: 09/22/21 12:27 PM  Result Value Ref Range   Cryptococcus neoformans/gattii (CSF) NOT DETECTED NOT DETECTED    Comment: (NOTE) Patients with a suspicion of cryptococcal meningitis should be tested  for cryptococcal antigen (CrAg).      Cytomegalovirus (CSF) NOT DETECTED NOT DETECTED   Enterovirus (CSF) NOT DETECTED NOT DETECTED   Escherichia coli K1 (CSF) NOT DETECTED NOT DETECTED    Comment: (NOTE) Only E. coli strains possessing the K1 capsular antigen will be detected.      Haemophilus influenzae (CSF) NOT DETECTED NOT DETECTED   Herpes simplex virus 1 (CSF) NOT DETECTED NOT DETECTED   Herpes simplex virus 2 (CSF) NOT DETECTED NOT DETECTED   Human herpesvirus 6 (CSF) NOT DETECTED NOT DETECTED   Human parechovirus (CSF) NOT DETECTED NOT DETECTED   Listeria monocytogenes (CSF) NOT DETECTED NOT DETECTED   Neisseria meningitis (CSF) NOT DETECTED NOT DETECTED    Comment: (NOTE) Only encapsulated strains of N. meningitidis will be detected.     Streptococcus agalactiae (CSF) NOT DETECTED NOT DETECTED   Streptococcus pneumoniae (CSF) NOT DETECTED NOT DETECTED   Varicella zoster virus  (CSF) NOT DETECTED NOT DETECTED    Comment: Performed at [facility], [address] [place], [place] [address]  ANA w/Reflex if Positive     Status: None   Collection Time: 09/22/21 12:33 PM  Result Value Ref Range   Anti Nuclear Antibody (ANA) Negative Negative    Comment: (NOTE) Performed At: [facility] [address] [place], [place] [id number] Jolene Schimke MD [id number]   C3 complement     Status: Abnormal   Collection Time: 09/22/21 12:33 PM  Result Value Ref Range   C3 Complement 184 (H) 82 - 167 mg/dL    Comment: (NOTE) Performed At: [facility] [address] [place], [place] [id number] Jolene Schimke MD [id number]   C4 complement     Status: None   Collection Time: 09/22/21 12:33 PM  Result Value Ref Range   Complement C4, Body Fluid 35 12 - 38 mg/dL    Comment: (NOTE) Performed At: [facility] [address] [place], [place] [id number] Jolene Schimke MD [id number]   CSF cell count with differential collection tube #: 1     Status: Abnormal   Collection Time: 09/22/21  1:48 PM  Result Value Ref Range   Tube # 1    Color, CSF COLORLESS COLORLESS   Appearance, CSF CLEAR CLEAR   Supernatant NOT INDICATED    RBC Count, CSF 5 (H) 0 /cu mm   WBC, CSF 1 0 - 5 /cu mm   Other Cells, CSF TOO FEW TO COUNT, SMEAR AVAILABLE FOR REVIEW     Comment: RARE NEUTROPHIL AND LYMPHOCYTE Performed at [facility], [address], [place], [place] [address]   CSF cell count with differential collection tube #: 4     Status: Abnormal   Collection Time: 09/22/21  1:48 PM  Result Value Ref Range   Tube # 4    Color, CSF COLORLESS COLORLESS   Appearance, CSF CLEAR CLEAR   Supernatant NOT INDICATED    RBC Count, CSF 1 (H) 0 /cu mm   WBC, CSF 0 0 - 5 /cu mm   Other Cells, CSF TOO FEW TO COUNT, SMEAR AVAILABLE FOR REVIEW     Comment: RARE LYMPHOCYTE Performed at [facility], [address] [place], [place] [address]   CSF  culture w Gram Stain     Status: None (Preliminary result)  Collection Time: 09/22/21  1:48 PM   Specimen: CSF; Cerebrospinal Fluid  Result Value Ref Range   Specimen Description CSF    Special Requests NONE    Gram Stain      WBC PRESENT, PREDOMINANTLY MONONUCLEAR NO ORGANISMS SEEN CYTOSPIN SMEAR Performed at [facility], [address] [place], [place] [address]    Culture PENDING    Report Status PENDING   Protein and glucose, CSF     Status: Abnormal   Collection Time: 09/22/21  1:48 PM  Result Value Ref Range   Glucose, CSF 126 (H) 40 - 70 mg/dL   Total  Protein, CSF 29 15 - 45 mg/dL    Comment: Performed at [facility], [address] [place], [place] [address]  Anaerobic culture w Gram Stain     Status: None (Preliminary result)   Collection Time: 09/22/21  1:49 PM   Specimen: CSF; Cerebrospinal Fluid  Result Value Ref Range   Specimen Description CSF    Special Requests NONE    Gram Stain      NO WBC SEEN NO ORGANISMS SEEN Performed at [facility], [address] [place], [place] [address]    Culture PENDING    Report Status PENDING   Glucose, capillary     Status: Abnormal   Collection Time: 09/22/21  3:17 PM  Result Value Ref Range   Glucose-Capillary 156 (H) 70 - 99 mg/dL    Comment: Glucose reference range applies only to samples taken after fasting for at least 8 hours.   Comment 1 Notify RN    Comment 2 Document in Chart   HIV Antibody (routine testing w rflx)     Status: None   Collection Time: 09/22/21  3:36 PM  Result Value Ref Range   HIV Screen 4th Generation wRfx Non Reactive Non Reactive    Comment: Performed at [facility], [address], [place], [place] [address]  Cryptococcal antigen, CSF     Status: None   Collection Time: 09/22/21  3:37 PM  Result Value Ref Range   Crypto Ag NEGATIVE NEGATIVE   Cryptococcal Ag Titer NOT INDICATED NOT INDICATED    Comment: Performed at [facility], [address], [place], [place] [address]  Glucose, capillary     Status: Abnormal   Collection Time: 09/22/21  7:30 PM  Result Value Ref Range   Glucose-Capillary 235 (H) 70 - 99 mg/dL    Comment: Glucose reference range applies only to samples taken after fasting for at least 8 hours.  Glucose, capillary     Status: Abnormal   Collection Time: 09/22/21 11:23 PM  Result Value Ref Range   Glucose-Capillary 284 (H) 70 - 99 mg/dL    Comment: Glucose reference range applies only to samples taken after fasting for at least 8 hours.  Basic metabolic panel     Status: Abnormal   Collection Time: 09/23/21  3:00 AM  Result Value Ref Range   Sodium 149 (H) 135 - 145 mmol/L   Potassium 3.9 3.5 - 5.1 mmol/L   Chloride 114 (H) 98 - 111 mmol/L   CO2 29 22 - 32 mmol/L   Glucose, Bld 214 (H) 70 - 99 mg/dL    Comment: Glucose reference range applies only to samples taken after fasting for at least 8 hours.   BUN 30 (H) 6 - 20 mg/dL   Creatinine, Ser 8.46 0.61 - 1.24 mg/dL   Calcium  8.7 (L) 8.9 - 10.3 mg/dL   GFR, Estimated >16 >10 mL/min    Comment: (NOTE) Calculated using the CKD-EPI Creatinine Equation (2021)    Anion gap 6 5 - 15    Comment: Performed at [facility], [address] [place], [place] [address]  Magnesium     Status: None   Collection Time: 09/23/21  3:00 AM  Result Value Ref Range   Magnesium 2.3 1.7 - 2.4 mg/dL    Comment: Performed at [facility], [address], [place], [place] [address]  Phosphorus     Status: None   Collection Time: 09/23/21  3:00 AM  Result Value Ref Range   Phosphorus 3.8 2.5 - 4.6 mg/dL    Comment: Performed at [facility], [address] [place], [place] [address]  Glucose, capillary     Status: Abnormal   Collection Time: 09/23/21  3:45 AM  Result Value Ref Range   Glucose-Capillary 185 (H) 70 - 99 mg/dL    Comment: Glucose reference range applies only to samples taken after fasting for at least 8 hours.  Glucose, capillary     Status:  Abnormal   Collection Time: 09/23/21  7:58 AM  Result Value Ref Range   Glucose-Capillary 222 (H) 70 - 99 mg/dL    Comment: Glucose reference range applies only to samples taken after fasting for at least 8 hours.    Recent Results (from the past 240 hour(s))  CSF culture w Gram Stain     Status: None (Preliminary result)   Collection Time: 09/22/21  1:48 PM   Specimen: CSF; Cerebrospinal Fluid  Result Value Ref Range Status   Specimen Description CSF  Final   Special Requests NONE  Final   Gram Stain   Final    WBC PRESENT, PREDOMINANTLY MONONUCLEAR NO ORGANISMS SEEN CYTOSPIN SMEAR Performed at [facility], [address] [place], [place] [address]    Culture PENDING  Incomplete   Report Status PENDING  Incomplete  Anaerobic culture w Gram Stain     Status: None (Preliminary result)   Collection Time: 09/22/21  1:49 PM   Specimen: CSF; Cerebrospinal Fluid  Result Value Ref Range Status   Specimen Description CSF  Final   Special Requests NONE  Final   Gram Stain   Final    NO WBC SEEN NO ORGANISMS SEEN Performed at [facility], [address], [place], [place] [address]    Culture PENDING  Incomplete   Report Status PENDING  Incomplete    Lipid Panel No results for input(s): "CHOL", "TRIG", "HDL", "CHOLHDL", "VLDL", "LDLCALC" in the last 72 hours.  Studies/Results: MR BRAIN W CONTRAST  Result Date: 09/21/2021 CLINICAL DATA:  Possible demyelination EXAM: MRI HEAD WITH CONTRAST TECHNIQUE: Multiplanar, multiecho pulse sequences of the brain and surrounding structures were obtained with intravenous contrast. CONTRAST:  10 mL Gadavist COMPARISON:  None Available. FINDINGS: There are numerous contrast-enhancing bilateral white matter lesions (at least 20). Largest lesions are in the occipital lobes, left frontal lobe and right parietal lobe. The largest lesion measures 8 mm. The findings are otherwise unchanged from yesterday's examination. IMPRESSION: Numerous  contrast-enhancing bilateral white matter lesions, consistent with active demyelination Electronically Signed   By: Deatra Robinson M.D.   On: 09/21/2021 19:23    Medications: Scheduled:  amLODipine  10 mg Per Tube Daily   aspirin  81 mg Per Tube Daily   Chlorhexidine Gluconate Cloth  6 each  Topical Daily   docusate  100 mg Per Tube Daily   feeding supplement (PROSource TF20)  60 mL Per Tube BID   fentaNYL (SUBLIMAZE) injection  100 mcg Intravenous Once   heparin  5,000 Units Subcutaneous Q8H   insulin aspart  0-20 Units Subcutaneous Q4H   insulin aspart  12 Units Subcutaneous Q4H   insulin glargine-yfgn  40 Units Subcutaneous Daily   mouth rinse  15 mL Mouth Rinse Q2H   pantoprazole  40 mg Per Tube Daily   polyethylene glycol  17 g Per Tube Daily   rosuvastatin  40 mg Per Tube Daily   sodium chloride flush  10-40 mL Intracatheter Q12H   sodium chloride flush  5 mL Intracatheter Q8H   Zinc Oxide   Topical BID   Continuous:  sodium chloride Stopped (09/15/21 1519)   sodium chloride     dexmedetomidine (PRECEDEX) IV infusion Stopped (09/22/21 0940)   dextrose     feeding supplement (VITAL 1.5 CAL) 60 mL/hr at 09/23/21 0800   phenylephrine (NEO-SYNEPHRINE) Adult infusion Stopped (09/22/21 0201)   CT-scan of the brain: Multiple white matter insult possibly representing infarcts or inflammatory process, chronic small vessel disease   MRI examination of the brain: Numerous round white matter lesions with restricted diffusion, possibly infarcts or demyelinating disease.   Assessment: 61 year old patient with history of stroke in 4/23 with minimal residual deficits, HTN, HLD, DM and recent jejunal bleed, presents with continued altered mental status and concerning multifocal enhancing white matter lesions on MRI that have morphologies and a distribution suggestive of MS. He has no prior history of demyelinating disease and wife states no prior episodes of focal weakness or vision loss. Also  with no family history of MS.   - On exam today, the patient is able to intermittently follow simple commands, but with significantly increased latencies of motor responses. Also able to move all extremities semipurposefully as he engages in fidgeting/restless movements in the bed. He did wave "bye" with his right hand when examiner was preparing to leave the room.   - Imaging: - White matter changes on MRI with multiple foci of bland restricted diffusion may represent strokes, although pattern would be unusual. The locations and morphologies of the lesions are more consistent with demyelinating disease.   - MRI brain follow up post-contrast imaging reveals >20 contrast enhancing lesions in the white matter bilaterally. Strongly favor inflammation / active demyelination possibly MS vs ADEM.  - Inflammation favored over infection given that he is afebrile and primarily altered but not ill-appearing therefore no indication for empiric CNS coverage at this time.  - LP was performed by CCM yesterday. CSF findings: - Glucose 126, RBC count 1-5 (unremarkable), clear, colorless, total protein normal, WBC normal.  - Gram stain: Negative - Fungal and bacterial cultures pending - Infectious disease Bio[employer] panel negative for all pathogens tested, including cryptococcus, VZV and HSV.  - Cryptococcal Ag negative.  - IgG index and OCBs pending - Labs: Vitamin B12 level consistent with supplementation. Mildly elevated Na. BUN 30. ALT 48. TSH normal. ANA negative. C3 complement level elevated at 184. HIV screen negative.  - DDx for AMS: Likely has a delirium component to altered mental status given long hospitalization. The acute multifocal enhancing lesions could also be contributing.  - DDx for white matter lesions on MRI: ADEM, multiple sclerosis and cerebral vasculitis (isolated or as a component of a possible systemic vasculitis) are felt to be highest on the DDx.  Recommendations: - MRI c and t spine  wwo contrast (ordered).  - Can start empiric IV solumedrol as there is no e/o infection on LP. Recommend 1000 mg qd x 5 days. Given his critical care status, will defer to CCM to make final decision and place order.   35 minutes spent in the neurological evaluation and management of this critically ill patient   LOS: 19 days   @Electronically  signed: Dr. Kerney Elbe 09/23/2021  9:07 AM

## 2021-09-24 DIAGNOSIS — K922 Gastrointestinal hemorrhage, unspecified: Secondary | ICD-10-CM | POA: Diagnosis not present

## 2021-09-24 DIAGNOSIS — G9341 Metabolic encephalopathy: Secondary | ICD-10-CM | POA: Diagnosis not present

## 2021-09-24 LAB — RENAL FUNCTION PANEL
Albumin: 2.3 g/dL — ABNORMAL LOW (ref 3.5–5.0)
Anion gap: 9 (ref 5–15)
BUN: 30 mg/dL — ABNORMAL HIGH (ref 6–20)
CO2: 25 mmol/L (ref 22–32)
Calcium: 8.9 mg/dL (ref 8.9–10.3)
Chloride: 114 mmol/L — ABNORMAL HIGH (ref 98–111)
Creatinine, Ser: 0.85 mg/dL (ref 0.61–1.24)
GFR, Estimated: >60 mL/min (ref >60)
Glucose, Bld: 348 mg/dL — ABNORMAL HIGH (ref 70–99)
Phosphorus: 3.5 mg/dL (ref 2.5–4.6)
Potassium: 4.2 mmol/L (ref 3.5–5.1)
Sodium: 148 mmol/L — ABNORMAL HIGH (ref 135–145)

## 2021-09-24 LAB — CBC WITH DIFFERENTIAL/PLATELET
Abs Immature Granulocytes: 0.07 10*3/uL (ref 0.00–0.07)
Basophils Absolute: 0 10*3/uL (ref 0.0–0.1)
Basophils Relative: 0 %
Eosinophils Absolute: 0 10*3/uL (ref 0.0–0.5)
Eosinophils Relative: 0 %
HCT: 34.8 % — ABNORMAL LOW (ref 39.0–52.0)
Hemoglobin: 10.9 g/dL — ABNORMAL LOW (ref 13.0–17.0)
Immature Granulocytes: 1 %
Lymphocytes Relative: 4 %
Lymphs Abs: 0.6 10*3/uL — ABNORMAL LOW (ref 0.7–4.0)
MCH: 28.4 pg (ref 26.0–34.0)
MCHC: 31.3 g/dL (ref 30.0–36.0)
MCV: 90.6 fL (ref 80.0–100.0)
Monocytes Absolute: 0.1 10*3/uL (ref 0.1–1.0)
Monocytes Relative: 1 %
Neutro Abs: 14.3 10*3/uL — ABNORMAL HIGH (ref 1.7–7.7)
Neutrophils Relative %: 94 %
Platelets: 278 10*3/uL (ref 150–400)
RBC: 3.84 MIL/uL — ABNORMAL LOW (ref 4.22–5.81)
RDW: 14.6 % (ref 11.5–15.5)
WBC: 15.1 10*3/uL — ABNORMAL HIGH (ref 4.0–10.5)
nRBC: 0 % (ref 0.0–0.2)

## 2021-09-24 LAB — GLUCOSE, CAPILLARY
Glucose-Capillary: 335 mg/dL — ABNORMAL HIGH (ref 70–99)
Glucose-Capillary: 258 mg/dL — ABNORMAL HIGH (ref 70–99)
Glucose-Capillary: 358 mg/dL — ABNORMAL HIGH (ref 70–99)
Glucose-Capillary: 321 mg/dL — ABNORMAL HIGH (ref 70–99)
Glucose-Capillary: 339 mg/dL — ABNORMAL HIGH (ref 70–99)

## 2021-09-24 LAB — IGG CSF INDEX
Albumin CSF-mCnc: 11 mg/dL — ABNORMAL LOW (ref 15–55)
Albumin: 1.9 g/dL — ABNORMAL LOW (ref 3.8–4.9)
CSF IgG Index: 0.5 (ref 0.0–0.7)
IgG (Immunoglobin G), Serum: 1157 mg/dL (ref 603–1613)
IgG, CSF: 3.5 mg/dL (ref 0.0–10.3)
IgG/Alb Ratio, CSF: 0.32 — ABNORMAL HIGH (ref 0.00–0.25)

## 2021-09-24 LAB — ANCA PROFILE
Anti-MPO Antibodies: <0.2 units (ref 0.0–0.9)
Anti-PR3 Antibodies: <0.2 units (ref 0.0–0.9)
Atypical P-ANCA titer: <1:20 {titer}
C-ANCA: <1:20 {titer}
P-ANCA: <1:20 {titer}

## 2021-09-24 LAB — HSV 1/2 PCR, CSF
HSV-1 DNA: NEGATIVE
HSV-2 DNA: NEGATIVE

## 2021-09-24 LAB — VDRL, CSF: VDRL Quant, CSF: NONREACTIVE

## 2021-09-24 MED ORDER — INSULIN ASPART 100 UNIT/ML IJ SOLN
22.0000 [IU] | INTRAMUSCULAR | Status: DC
  Administered 2021-09-24 – 2021-09-26 (×12): 22 [IU] via SUBCUTANEOUS

## 2021-09-24 MED ORDER — FREE WATER
200.0000 mL | Freq: Four times a day (QID) | Status: DC
  Administered 2021-09-24 – 2021-09-25 (×5): 200 mL

## 2021-09-24 MED ORDER — PANTOPRAZOLE 2 MG/ML SUSPENSION
40.0000 mg | Freq: Two times a day (BID) | ORAL | Status: DC
  Administered 2021-09-24 – 2021-10-22 (×55): 40 mg
  Filled 2021-09-24 (×61): qty 20

## 2021-09-24 MED ORDER — INSULIN GLARGINE-YFGN 100 UNIT/ML ~~LOC~~ SOLN
35.0000 [IU] | Freq: Two times a day (BID) | SUBCUTANEOUS | Status: DC
  Administered 2021-09-25 – 2021-10-01 (×14): 35 [IU] via SUBCUTANEOUS
  Filled 2021-09-24 (×16): qty 0.35

## 2021-09-24 NOTE — Progress Notes (Addendum)
PROGRESS NOTE    Troy Randall  [id number] DOB: [birth year] DOA: 09/04/2021 PCP: [name], MD   Brief Narrative: 61 year old with past medical history significant for hypertension, hyperlipidemia, diabetes type 2, CVA who was admitted initially for GI bleed status post IR embolization followed by cholecystitis status post percutaneous cholecystostomy tube placement with ongoing altered mental status.  MRI of the brain with contrast showed numerous contrast-enhancing bilateral white matter lesions concerning for debilitating disease.  Nephrology consulted, underwent LP 9/18 with low suspicious for infectious etiology.  Neurology recommending high-dose IV steroids.  IV steroid started 9/19. Transfer to [facility].    Assessment & Plan:   Active Problems:   Uncontrolled type 2 diabetes mellitus with hyperglycemia, without long-term current use of insulin (HCC)   Essential hypertension   Hyperlipidemia   Chronic respiratory failure with hypoxia (HCC)   Tracheostomy status (HCC)   Acute encephalopathy   Anemia due to blood loss   History of lower GI bleeding: 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    H/O ischemic bowel disease   Acute emphysematous cholecystitis  1-Acute metabolic encephalopathy, questionable hypoxic ischemic encephalopathy, history of CVA without residual deficits -Patient was not following commands, continues to be lethargic.  Underwent LP on 9/18. -MRI brain: Numerous contrast-enhancing bilateral white matter lesions, consistent with active demyelination.  -MRI cervical spine, thoracic : Limited exam, within limitations there is not definitive evidence of active demyelinization. Within limitations of motion artifact, there may be demyelinating lesions at the C6 and T1 vertebral body levels. -CSF fluid: Cryptococcal antigen negative, HSV PCR pending, anaerobic culture: Pending, fungus culture no isolated, I GG CSF index pending VDRL CSF pending,  oligoclonal bands pending cultures: No growth today. -Patient was a started on high-dose IV steroids to treat for MS  Acute hypoxic respiratory failure status post cutaneous tracheostomy 9/11: Status post tracheostomy 9/11 Continue with trach care  Hemorrhagic shock: ABLA secondary to lower GI bleed, jejunal diverticular and ulcer secondary to ischemia Acute blood loss and iron deficiency anemia secondary to above. -Status post 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 9/2 and bedside endoscopy 9/6.  Endoscopic capsule removal 9/15 -He received multiple blood transfusion during this hospitalization. -Need to monitor hemoglobin -started baby  aspirin started 9/19 -Change PPI to twice daily due to high-dose steroids  E Coli Cholecystitis status post percutaneous cholecystostomy drain placement: Status post percutaneous cholecystostomy drain placed 9/3. Pleated 7 days of ceftriaxone Will need outpatient surgery and IR follow-up   AKI Of CRRT 9/8 Bilateral nonobstructive nephrolithiasis suspect chronic BPH, hyponatremia Nephrology signed off Renal function stable  Hypernatremia: Increase free water   Diabetes type 2: Hyperglycemia, uncontrolled.  Continue with gargling and sliding scale and NovoLog Increase gargline and meals coverage.   Hypertension, hyperlipidemia Continue with amlodipine. Lisinopril  on hold due to initial presentation of AKI Continue with Crestor  Pulmonary nodules in bilateral bases Never smoker.  Nutrition; on tube feeding   Nutrition Problem: Increased nutrient needs Etiology: acute illness    Signs/Symptoms: estimated needs    Interventions: Prostat, Tube feeding  Estimated body mass index is 35.97 kg/m as calculated from the following:   Height as of this encounter: 6' (1.829 m).   Weight as of this encounter: 120.3 kg.   DVT prophylaxis: heparin  Code Status: full code Family Communication: wife at bedside.  Disposition Plan:   Status is: Inpatient Remains inpatient appropriate because: management encephalopathy. He will need LTAC  Consultants:  Neurology CCM IR  Procedures:  8/31 presented to [facility], [name] Admit, Gi Consult 9/1 CTA> no evidence of active GI bleed, 2 U PRBC 9/2 2 U PRBC, colonoscopy > no active bleed found but large clots throughout entire colon. EGD> small hiatal hernia, erosive gastropathy with no stigmata of recent bleeding, and non-bleeding duodenal diverticulum.   Underwent then small bowel enteroscopy which noted intermittent blood pumping in the proximal jejunum but was unable to be reached despite multiple attempts, was injected and clip placed. Repeat CTA repeated, which showed positive jejunal diverticular bleeding. IR> active extrav at [facility] territory, jejunal arcade branch, successful coil embo 9/3 Intubated. CTA> no evidence of active GI bleeding.  New finding of abnormal gallbladder with nondependent air, gallbladder wall air, pericholecystic and right upper quadrant information.  IR placed percutaneous cholecystotomy tube.  9/4 R. IJ central line placed with dialysis triple lumen catheter 9/5 CRRT initiated 9/6 Bedside EGD without any evidence of bleeding. CTA a/p without etiology for bleed 9/8 CRRT discontinued. Required ETT exchange yesterday due to blown cuff. 9/11 Bronchoscopic guided percutaneous tracheostomy placement.  Right IJ central line removed. 9/15 flexible sigmoidoscopy with removal of endoscopy 9/16 MRI brain w/o contrast amended study secondary to range of motion.  Rapid development numerous round white matter lesions in both cerebral hemispheres.  Prior MRIs. 9/17 MRI brain with contrast numerous contrast-enhancing bilateral white matter lesions consistent with active demyelination 9/18 Lumbar puncture   Antimicrobials:    Subjective: He is alert, not following command.  Last BM 9/18 Objective: Vitals:   09/24/21 0700 09/24/21 0749 09/24/21 0800 09/24/21 0821   BP: 114/72  128/81 128/81  Pulse: (!) 105 99 98 (!) 109  Resp: 19 19 (!) 22 (!) 22  Temp:  (!) 97.5 F (36.4 C)    TempSrc:  Oral    SpO2: 98% 96% 96% 95%  Weight:      Height:        Intake/Output Summary (Last 24 hours) at 09/24/2021 0829 Last data filed at 09/24/2021 0800 Gross per 24 hour  Intake 1989.52 ml  Output 1350 ml  Net 639.52 ml   Filed Weights   09/20/21 0500 09/21/21 0500 09/23/21 0500  Weight: 125.6 kg 122.1 kg 120.3 kg    Examination:  General exam: Appears calm and comfortable  Respiratory system: Trach in [place].  Cardiovascular system: S1 & S2 heard, RRR. No JVD, murmurs, rubs, gallops or clicks. No pedal edema. Gastrointestinal system: Abdomen is nondistended, soft and nontender. No organomegaly or masses felt. Normal bowel sounds heard. Central nervous system: Alert   Data Reviewed: I have personally reviewed following labs and imaging studies  CBC: Recent Labs  Lab 09/19/21 0836 09/19/21 0942 09/21/21 1008 09/22/21 0938  WBC 10.1  --  10.0 8.4  NEUTROABS 8.4*  --  8.2* 6.6  HGB 10.7* 10.5* 11.2* 10.1*  HCT 34.6* 31.0* 35.7* 32.3*  MCV 92.0  --  91.1 91.2  PLT 450*  --  366 [id number]   Basic Metabolic Panel: Recent Labs  Lab 09/19/21 0836 09/19/21 0942 09/21/21 1008 09/22/21 0937 09/23/21 0300 09/24/21 0343  NA 145 146* 147* 149* 149* 148*  K 3.7 3.8 4.2 4.2 3.9 4.2  CL 110  --  114* 113* 114* 114*  CO2 26  --  25 29 29 25   GLUCOSE 242*  --  323* 250* 214* 348*  BUN 16  --  24* 31* 30* 30*  CREATININE 0.76  --  0.83 0.97 0.83  0.85  CALCIUM 8.4*  --  8.6* 8.8* 8.7* 8.9  MG 2.1  --  2.2 2.2 2.3  --   PHOS 3.2  --  3.8 3.9 3.8 3.5   GFR: Estimated Creatinine Clearance: 125.3 mL/min (by C-G formula based on SCr of 0.85 mg/dL). Liver Function Tests: Recent Labs  Lab 09/20/21 1443 09/24/21 0343  AST 25  --   ALT 48*  --   ALKPHOS 109  --   BILITOT 0.8  --   PROT 6.1*  --   ALBUMIN 2.2* 2.3*   No results for input(s): "LIPASE",  "AMYLASE" in the last 168 hours. Recent Labs  Lab 09/20/21 1443  AMMONIA 24   Coagulation Profile: No results for input(s): "INR", "PROTIME" in the last 168 hours. Cardiac Enzymes: No results for input(s): "CKTOTAL", "CKMB", "CKMBINDEX", "TROPONINI" in the last 168 hours. BNP (last 3 results) No results for input(s): "PROBNP" in the last 8760 hours. HbA1C: No results for input(s): "HGBA1C" in the last 72 hours. CBG: Recent Labs  Lab 09/23/21 1735 09/23/21 1940 09/23/21 2340 09/24/21 0314 09/24/21 0744  GLUCAP 226* 225* 301* 335* 258*   Lipid Profile: No results for input(s): "CHOL", "HDL", "LDLCALC", "TRIG", "CHOLHDL", "LDLDIRECT" in the last 72 hours. Thyroid Function Tests: Recent Labs    09/22/21 0325  TSH 1.996   Anemia Panel: Recent Labs    09/22/21 0325  VITAMINB12 1,427*   Sepsis Labs: No results for input(s): "PROCALCITON", "LATICACIDVEN" in the last 168 hours.  Recent Results (from the past 240 hour(s))  CSF culture w Gram Stain     Status: None (Preliminary result)   Collection Time: 09/22/21  1:48 PM   Specimen: CSF; Cerebrospinal Fluid  Result Value Ref Range Status   Specimen Description CSF  Final   Special Requests NONE  Final   Gram Stain   Final    WBC PRESENT, PREDOMINANTLY MONONUCLEAR NO ORGANISMS SEEN CYTOSPIN SMEAR    Culture   Final    NO GROWTH < 24 HOURS Performed at [facility], [place] [address], [place], [place] [address]    Report Status PENDING  Incomplete  Culture, fungus without smear     Status: None (Preliminary result)   Collection Time: 09/22/21  1:49 PM   Specimen: CSF; Cerebrospinal Fluid  Result Value Ref Range Status   Specimen Description CSF  Final   Special Requests NONE  Final   Culture   Final    NO FUNGUS ISOLATED AFTER 1 DAY Performed at [facility], [place] [address] [place], [place] [address]    Report Status PENDING  Incomplete  Anaerobic culture w Gram Stain     Status: None (Preliminary  result)   Collection Time: 09/22/21  1:49 PM   Specimen: CSF; Cerebrospinal Fluid  Result Value Ref Range Status   Specimen Description CSF  Final   Special Requests NONE  Final   Gram Stain   Final    NO WBC SEEN NO ORGANISMS SEEN Performed at [facility], [place] [address] [place], [place] [address]    Culture PENDING  Incomplete   Report Status PENDING  Incomplete         Radiology Studies: MR CERVICAL SPINE W WO CONTRAST  Result Date: 09/23/2021 CLINICAL DATA:  Demyelinating disease. EXAM: MRI CERVICAL AND THORACIC SPINE WITHOUT AND WITH CONTRAST TECHNIQUE: Multiplanar and multiecho pulse sequences of the cervical spine, to include the craniocervical junction and cervicothoracic junction, and the thoracic spine, were  obtained without and with intravenous contrast. CONTRAST:  10 ml gadopiclenol COMPARISON:  None Available. FINDINGS: Limitations: Assessment is limited due to the degree of motion artifact, particularly on the axial T2 weighted sequences, which are essentially nondiagnostic. MRI CERVICAL SPINE FINDINGS Alignment: There is straightening of the normal cervical lordosis Vertebrae: No fracture, evidence of discitis, or bone lesion. Cord: Assessment of the spinal cord is markedly limited due to the degree of motion artifact. There is no definite evidence of a active demyelination in the cervical spinal cord. There may be a focal T2 hyperintense lesion in the cervical spinal cord at the C6 vertebral body level (series 6, image 8) and at the T1 vertebral body level (series 6, image 9), but these lesions are not confirmed on the axial sequences due to the degree of motion artifact. Posterior Fossa, vertebral arteries, paraspinal tissues: Negative. Disc levels: There is mild to moderate spinal canal stenosis at the C5-C6 level secondary to a disc bulge. Assessment for neural foraminal stenosis is limited due to the degree of motion artifact on the axial sequences. Within this  limitation, there is likely mild-to-moderate neural foraminal stenosis at the C5-C6 level. MRI THORACIC SPINE FINDINGS Alignment:  Physiologic. Vertebrae: No fracture, evidence of discitis, or bone lesion. Cord:  See above Paraspinal and other soft tissues: Negative. Disc levels: No evidence of high-grade spinal canal stenosis. Assessment for neural foraminal stenosis is limited due to the degree of motion artifact. IMPRESSION: Markedly limited exam for assessment of the spinal cord due to the degree of motion artifact. Within this limitation, there is no definite evidence of active demyelination. Within limitations of motion artifact, there may be demyelinating lesions at the C6 and T1 vertebral body levels. Repeat exam is recommended if more definitive characterization as clinically warranted. Electronically Signed   By: Marin Roberts M.D.   On: 09/23/2021 15:02   MR THORACIC SPINE W WO CONTRAST  Result Date: 09/23/2021 CLINICAL DATA:  Demyelinating disease. EXAM: MRI CERVICAL AND THORACIC SPINE WITHOUT AND WITH CONTRAST TECHNIQUE: Multiplanar and multiecho pulse sequences of the cervical spine, to include the craniocervical junction and cervicothoracic junction, and the thoracic spine, were obtained without and with intravenous contrast. CONTRAST:  10 ml gadopiclenol COMPARISON:  None Available. FINDINGS: Limitations: Assessment is limited due to the degree of motion artifact, particularly on the axial T2 weighted sequences, which are essentially nondiagnostic. MRI CERVICAL SPINE FINDINGS Alignment: There is straightening of the normal cervical lordosis Vertebrae: No fracture, evidence of discitis, or bone lesion. Cord: Assessment of the spinal cord is markedly limited due to the degree of motion artifact. There is no definite evidence of a active demyelination in the cervical spinal cord. There may be a focal T2 hyperintense lesion in the cervical spinal cord at the C6 vertebral body level (series 6, image  8) and at the T1 vertebral body level (series 6, image 9), but these lesions are not confirmed on the axial sequences due to the degree of motion artifact. Posterior Fossa, vertebral arteries, paraspinal tissues: Negative. Disc levels: There is mild to moderate spinal canal stenosis at the C5-C6 level secondary to a disc bulge. Assessment for neural foraminal stenosis is limited due to the degree of motion artifact on the axial sequences. Within this limitation, there is likely mild-to-moderate neural foraminal stenosis at the C5-C6 level. MRI THORACIC SPINE FINDINGS Alignment:  Physiologic. Vertebrae: No fracture, evidence of discitis, or bone lesion. Cord:  See above Paraspinal and other soft tissues: Negative. Disc levels: No evidence  of high-grade spinal canal stenosis. Assessment for neural foraminal stenosis is limited due to the degree of motion artifact. IMPRESSION: Markedly limited exam for assessment of the spinal cord due to the degree of motion artifact. Within this limitation, there is no definite evidence of active demyelination. Within limitations of motion artifact, there may be demyelinating lesions at the C6 and T1 vertebral body levels. Repeat exam is recommended if more definitive characterization as clinically warranted. Electronically Signed   By: Marin Roberts M.D.   On: 09/23/2021 15:02        Scheduled Meds:  amLODipine  10 mg Per Tube Daily   aspirin  81 mg Per Tube Daily   Chlorhexidine Gluconate Cloth  6 each Topical Daily   docusate  100 mg Per Tube Daily   feeding supplement (PROSource TF20)  60 mL Per Tube BID   free water  200 mL Per Tube Q8H   heparin  5,000 Units Subcutaneous Q8H   insulin aspart  0-20 Units Subcutaneous Q4H   insulin aspart  16 Units Subcutaneous Q4H   insulin glargine-yfgn  30 Units Subcutaneous BID   mouth rinse  15 mL Mouth Rinse Q2H   pantoprazole  40 mg Per Tube Daily   polyethylene glycol  17 g Per Tube Daily   rosuvastatin  40 mg Per Tube  Daily   sodium chloride flush  10-40 mL Intracatheter Q12H   sodium chloride flush  5 mL Intracatheter Q8H   Zinc Oxide   Topical BID   Continuous Infusions:  sodium chloride Stopped (09/15/21 1519)   sodium chloride     dextrose     feeding supplement (VITAL 1.5 CAL) 60 mL/hr at 09/24/21 0800   lactated ringers     methylPREDNISolone (SOLU-MEDROL) injection Stopped (09/23/21 1549)     LOS: 20 days    Time spent: 35 minutes    Haislee Corso A Adryan Shin, MD [facility]   If 7PM-7AM, please contact night-coverage [email or web address]  09/24/2021, 8:29 AM

## 2021-09-24 NOTE — Progress Notes (Signed)
Subjective: Awake and lying comfortably in bed. Wife is at the bedside. She does not endorse any improvement relative to yesterday.   Objective: Current vital signs: BP (!) 159/66   Pulse 90   Temp 99 F (37.2 C) (Axillary)   Resp (!) 25   Ht 6' (1.829 m)   Wt 120.3 kg   SpO2 97%   BMI 35.97 kg/m  Vital signs in last 24 hours: Temp:  [97.6 F (36.4 C)-99 F (37.2 C)] 99 F (37.2 C) (09/20 0316) Pulse Rate:  [65-119] 90 (09/20 0600) Resp:  [17-29] 25 (09/20 0600) BP: (92-176)/(57-110) 159/66 (09/20 0600) SpO2:  [94 %-100 %] 97 % (09/20 0600) FiO2 (%):  [28 %-40 %] 28 % (09/20 0400)  Intake/Output from previous day: 09/19 0701 - 09/20 0700 In: 1989.5 [I.V.:233.5; NG/GT:1690; IV Piggyback:66] Out: 1350 [Urine:1200; Drains:150] Intake/Output this shift: No intake/output data recorded. Nutritional status:  Diet Order             Diet NPO time specified  Diet effective now                   HEENT: [place]/AT. Trach collar is noted.  Ext: No edema   Neurologic Exam: Ment: Awake with eyes half-open. Does not fixate or track today and did not look at wife when she spoke to him as he did transiently during yesterday's exam. Follows some simple commands with increased latencies of motor responses. Does not attempt to speak. Able to move all extremities semipurposefully as he engages in fidgeting/restless movements in the bed. He did wave "bye" again with his right hand when wife asked him to as examiner was leaving the room, similar to yesterday.    CN: Pupils 3 mm and reactive. Face is symmetric but with decreased tone to mouth/jaw.   Motor/Sensory: Weak movement of extremities to noxious without asymmetry. Follows some simple commands with increased latencies of motor responses, but does not follow commands for more detailed strength testing.  Reflexes: Low-amplitude 3+ brachioradialis and patellar reflexes Cerebellar/Gait: Unable to assess  Lab Results: Results for orders  placed or performed during the hospital encounter of 09/04/21 (from the past 48 hour(s))  Glucose, capillary     Status: Abnormal   Collection Time: 09/22/21  7:50 AM  Result Value Ref Range   Glucose-Capillary 227 (H) 70 - 99 mg/dL    Comment: Glucose reference range applies only to samples taken after fasting for at least 8 hours.  Basic metabolic panel     Status: Abnormal   Collection Time: 09/22/21  9:37 AM  Result Value Ref Range   Sodium 149 (H) 135 - 145 mmol/L   Potassium 4.2 3.5 - 5.1 mmol/L   Chloride 113 (H) 98 - 111 mmol/L   CO2 29 22 - 32 mmol/L   Glucose, Bld 250 (H) 70 - 99 mg/dL    Comment: Glucose reference range applies only to samples taken after fasting for at least 8 hours.   BUN 31 (H) 6 - 20 mg/dL   Creatinine, Ser 0.97 0.61 - 1.24 mg/dL   Calcium 8.8 (L) 8.9 - 10.3 mg/dL   GFR, Estimated >60 >60 mL/min    Comment: (NOTE) Calculated using the CKD-EPI Creatinine Equation (2021)    Anion gap 7 5 - 15    Comment: Performed at [facility] [address] [place], [place] [address]  Magnesium     Status: None   Collection Time: 09/22/21  9:37 AM  Result  Value Ref Range   Magnesium 2.2 1.7 - 2.4 mg/dL    Comment: Performed at [facility], [place] [address], [place], [place] [address]  Phosphorus     Status: None   Collection Time: 09/22/21  9:37 AM  Result Value Ref Range   Phosphorus 3.9 2.5 - 4.6 mg/dL    Comment: Performed at [facility] [address], [place], [place] [address]  CBC with Differential/Platelet     Status: Abnormal   Collection Time: 09/22/21  9:38 AM  Result Value Ref Range   WBC 8.4 4.0 - 10.5 K/uL   RBC 3.54 (L) 4.22 - 5.81 MIL/uL   Hemoglobin 10.1 (L) 13.0 - 17.0 g/dL   HCT 32.3 (L) 39.0 - 52.0 %   MCV 91.2 80.0 - 100.0 fL   MCH 28.5 26.0 - 34.0 pg   MCHC 31.3 30.0 - 36.0 g/dL   RDW 14.8 11.5 - 15.5 %   Platelets 264 150 - 400 K/uL   nRBC 0.0 0.0 - 0.2 %   Neutrophils Relative % 78 %   Neutro Abs 6.6 1.7 -  7.7 K/uL   Lymphocytes Relative 11 %   Lymphs Abs 0.9 0.7 - 4.0 K/uL   Monocytes Relative 6 %   Monocytes Absolute 0.5 0.1 - 1.0 K/uL   Eosinophils Relative 3 %   Eosinophils Absolute 0.2 0.0 - 0.5 K/uL   Basophils Relative 1 %   Basophils Absolute 0.1 0.0 - 0.1 K/uL   Immature Granulocytes 1 %   Abs Immature Granulocytes 0.04 0.00 - 0.07 K/uL    Comment: Performed at [facility] [address] [place], [place] [address]  Glucose, capillary     Status: Abnormal   Collection Time: 09/22/21 11:28 AM  Result Value Ref Range   Glucose-Capillary 267 (H) 70 - 99 mg/dL    Comment: Glucose reference range applies only to samples taken after fasting for at least 8 hours.   Comment 1 Notify RN    Comment 2 Document in Chart   Meningitis/Encephalitis Panel (CSF)     Status: None   Collection Time: 09/22/21 12:27 PM  Result Value Ref Range   Cryptococcus neoformans/gattii (CSF) NOT DETECTED NOT DETECTED    Comment: (NOTE) Patients with a suspicion of cryptococcal meningitis should be tested  for cryptococcal antigen (CrAg).      Cytomegalovirus (CSF) NOT DETECTED NOT DETECTED   Enterovirus (CSF) NOT DETECTED NOT DETECTED   Escherichia coli K1 (CSF) NOT DETECTED NOT DETECTED    Comment: (NOTE) Only E. coli strains possessing the K1 capsular antigen will be detected.      Haemophilus influenzae (CSF) NOT DETECTED NOT DETECTED   Herpes simplex virus 1 (CSF) NOT DETECTED NOT DETECTED   Herpes simplex virus 2 (CSF) NOT DETECTED NOT DETECTED   Human herpesvirus 6 (CSF) NOT DETECTED NOT DETECTED   Human parechovirus (CSF) NOT DETECTED NOT DETECTED   Listeria monocytogenes (CSF) NOT DETECTED NOT DETECTED   Neisseria meningitis (CSF) NOT DETECTED NOT DETECTED    Comment: (NOTE) Only encapsulated strains of N. meningitidis will be detected.     Streptococcus agalactiae (CSF) NOT DETECTED NOT DETECTED   Streptococcus pneumoniae (CSF) NOT DETECTED NOT DETECTED   Varicella zoster  virus (CSF) NOT DETECTED NOT DETECTED    Comment: Performed at [facility] [address] [place], [place] [address]  ANA w/Reflex if Positive     Status: None   Collection Time: 09/22/21  12:33 PM  Result Value Ref Range   Anti Nuclear Antibody (ANA) Negative Negative    Comment: (NOTE) Performed At: [facility] [place], [place] [id number] Rush Farmer MD [id number]   C3 complement     Status: Abnormal   Collection Time: 09/22/21 12:33 PM  Result Value Ref Range   C3 Complement 184 (H) 82 - 167 mg/dL    Comment: (NOTE) Performed At: [facility] [place], [place] [id number] Rush Farmer MD [id number]   C4 complement     Status: None   Collection Time: 09/22/21 12:33 PM  Result Value Ref Range   Complement C4, Body Fluid 35 12 - 38 mg/dL    Comment: (NOTE) Performed At: [facility] [place], [place] [id number] Rush Farmer MD [id number]   CSF cell count with differential collection tube #: 1     Status: Abnormal   Collection Time: 09/22/21  1:48 PM  Result Value Ref Range   Tube # 1    Color, CSF COLORLESS COLORLESS   Appearance, CSF CLEAR CLEAR   Supernatant NOT INDICATED    RBC Count, CSF 5 (H) 0 /cu mm   WBC, CSF 1 0 - 5 /cu mm   Other Cells, CSF TOO FEW TO COUNT, SMEAR AVAILABLE FOR REVIEW     Comment: RARE NEUTROPHIL AND LYMPHOCYTE Performed at [facility] [address] [place], [place] [address]   CSF cell count with differential collection tube #: 4     Status: Abnormal   Collection Time: 09/22/21  1:48 PM  Result Value Ref Range   Tube # 4    Color, CSF COLORLESS COLORLESS   Appearance, CSF CLEAR CLEAR   Supernatant NOT INDICATED    RBC Count, CSF 1 (H) 0 /cu mm   WBC, CSF 0 0 - 5 /cu mm   Other Cells, CSF TOO FEW TO COUNT, SMEAR AVAILABLE FOR REVIEW     Comment: RARE LYMPHOCYTE Performed at [facility] [address] [place], [place] [address]    CSF culture w Gram Stain     Status: None (Preliminary result)   Collection Time: 09/22/21  1:48 PM   Specimen: CSF; Cerebrospinal Fluid  Result Value Ref Range   Specimen Description CSF    Special Requests NONE    Gram Stain      WBC PRESENT, PREDOMINANTLY MONONUCLEAR NO ORGANISMS SEEN CYTOSPIN SMEAR    Culture      NO GROWTH < 24 HOURS Performed at [facility], [place] [address] [place], [place] [address]    Report Status PENDING   Protein and glucose, CSF     Status: Abnormal   Collection Time: 09/22/21  1:48 PM  Result Value Ref Range   Glucose, CSF 126 (H) 40 - 70 mg/dL   Total  Protein, CSF 29 15 - 45 mg/dL    Comment: Performed at [facility] [address] [place], [place] [address]  Culture, fungus without smear     Status: None (Preliminary result)   Collection Time: 09/22/21  1:49 PM   Specimen: CSF; Cerebrospinal Fluid  Result Value Ref Range   Specimen Description CSF    Special Requests NONE    Culture      NO FUNGUS ISOLATED AFTER 1 DAY Performed at [facility] [address], [place], [place] [address]    Report Status PENDING   Anaerobic culture w Gram  Stain     Status: None (Preliminary result)   Collection Time: 09/22/21  1:49 PM   Specimen: CSF; Cerebrospinal Fluid  Result Value Ref Range   Specimen Description CSF    Special Requests NONE    Gram Stain      NO WBC SEEN NO ORGANISMS SEEN Performed at [facility], [address], [place], [place] [address]    Culture PENDING    Report Status PENDING   Glucose, capillary     Status: Abnormal   Collection Time: 09/22/21  3:17 PM  Result Value Ref Range   Glucose-Capillary 156 (H) 70 - 99 mg/dL    Comment: Glucose reference range applies only to samples taken after fasting for at least 8 hours.   Comment 1 Notify RN    Comment 2 Document in Chart   HIV Antibody (routine testing w rflx)     Status: None   Collection Time: 09/22/21  3:36 PM  Result Value Ref Range    HIV Screen 4th Generation wRfx Non Reactive Non Reactive    Comment: Performed at [facility], [address], [place], [place] [address]  Cryptococcal antigen, CSF     Status: None   Collection Time: 09/22/21  3:37 PM  Result Value Ref Range   Crypto Ag NEGATIVE NEGATIVE   Cryptococcal Ag Titer NOT INDICATED NOT INDICATED    Comment: Performed at [facility], [address] [place], [place] [address]  Glucose, capillary     Status: Abnormal   Collection Time: 09/22/21  7:30 PM  Result Value Ref Range   Glucose-Capillary 235 (H) 70 - 99 mg/dL    Comment: Glucose reference range applies only to samples taken after fasting for at least 8 hours.  Glucose, capillary     Status: Abnormal   Collection Time: 09/22/21 11:23 PM  Result Value Ref Range   Glucose-Capillary 284 (H) 70 - 99 mg/dL    Comment: Glucose reference range applies only to samples taken after fasting for at least 8 hours.  Basic metabolic panel     Status: Abnormal   Collection Time: 09/23/21  3:00 AM  Result Value Ref Range   Sodium 149 (H) 135 - 145 mmol/L   Potassium 3.9 3.5 - 5.1 mmol/L   Chloride 114 (H) 98 - 111 mmol/L   CO2 29 22 - 32 mmol/L   Glucose, Bld 214 (H) 70 - 99 mg/dL    Comment: Glucose reference range applies only to samples taken after fasting for at least 8 hours.   BUN 30 (H) 6 - 20 mg/dL   Creatinine, Ser 9.74 0.61 - 1.24 mg/dL   Calcium 8.7 (L) 8.9 - 10.3 mg/dL   GFR, Estimated >16 >38 mL/min    Comment: (NOTE) Calculated using the CKD-EPI Creatinine Equation (2021)    Anion gap 6 5 - 15    Comment: Performed at [facility], [address] [place], [place] [address]  Magnesium     Status: None   Collection Time: 09/23/21  3:00 AM  Result Value Ref Range   Magnesium 2.3 1.7 - 2.4 mg/dL    Comment: Performed at [facility], [address] [place], [place] [address]  Phosphorus     Status: None   Collection Time: 09/23/21  3:00 AM  Result Value Ref Range    Phosphorus 3.8 2.5 - 4.6 mg/dL    Comment: Performed at [facility], [address] [place], [place] [address]  Glucose, capillary     Status: Abnormal   Collection Time: 09/23/21  3:45 AM  Result Value Ref Range   Glucose-Capillary 185 (H) 70 - 99 mg/dL    Comment: Glucose reference range applies only to samples taken after fasting for at least 8 hours.  Glucose, capillary     Status: Abnormal   Collection Time: 09/23/21  7:58 AM  Result Value Ref Range   Glucose-Capillary 222 (H) 70 - 99 mg/dL    Comment: Glucose reference range applies only to samples taken after fasting for at least 8 hours.  Glucose, capillary     Status: Abnormal   Collection Time: 09/23/21 11:14 AM  Result Value Ref Range   Glucose-Capillary 164 (H) 70 - 99 mg/dL    Comment: Glucose reference range applies only to samples taken after fasting for at least 8 hours.  Glucose, capillary     Status: Abnormal   Collection Time: 09/23/21  2:09 PM  Result Value Ref Range   Glucose-Capillary 204 (H) 70 - 99 mg/dL    Comment: Glucose reference range applies only to samples taken after fasting for at least 8 hours.  Glucose, capillary     Status: Abnormal   Collection Time: 09/23/21  5:35 PM  Result Value Ref Range   Glucose-Capillary 226 (H) 70 - 99 mg/dL    Comment: Glucose reference range applies only to samples taken after fasting for at least 8 hours.  Glucose, capillary     Status: Abnormal   Collection Time: 09/23/21  7:40 PM  Result Value Ref Range   Glucose-Capillary 225 (H) 70 - 99 mg/dL    Comment: Glucose reference range applies only to samples taken after fasting for at least 8 hours.  Glucose, capillary     Status: Abnormal   Collection Time: 09/23/21 11:40 PM  Result Value Ref Range   Glucose-Capillary 301 (H) 70 - 99 mg/dL    Comment: Glucose reference range applies only to samples taken after fasting for at least 8 hours.  Glucose, capillary     Status: Abnormal   Collection Time: 09/24/21   3:14 AM  Result Value Ref Range   Glucose-Capillary 335 (H) 70 - 99 mg/dL    Comment: Glucose reference range applies only to samples taken after fasting for at least 8 hours.  Renal function panel     Status: Abnormal   Collection Time: 09/24/21  3:43 AM  Result Value Ref Range   Sodium 148 (H) 135 - 145 mmol/L   Potassium 4.2 3.5 - 5.1 mmol/L   Chloride 114 (H) 98 - 111 mmol/L   CO2 25 22 - 32 mmol/L   Glucose, Bld 348 (H) 70 - 99 mg/dL    Comment: Glucose reference range applies only to samples taken after fasting for at least 8 hours.   BUN 30 (H) 6 - 20 mg/dL   Creatinine, Ser 0.85 0.61 - 1.24 mg/dL   Calcium 8.9 8.9 - 10.3 mg/dL   Phosphorus 3.5 2.5 - 4.6 mg/dL   Albumin 2.3 (L) 3.5 - 5.0 g/dL   GFR, Estimated >60 >60 mL/min    Comment: (NOTE) Calculated using the CKD-EPI Creatinine Equation (2021)    Anion gap 9 5 - 15    Comment: Performed at [facility] [address] [place], [place] [address]    Recent Results (from the past 240 hour(s))  CSF culture w Gram Stain     Status: None (  Preliminary result)   Collection Time: 09/22/21  1:48 PM   Specimen: CSF; Cerebrospinal Fluid  Result Value Ref Range Status   Specimen Description CSF  Final   Special Requests NONE  Final   Gram Stain   Final    WBC PRESENT, PREDOMINANTLY MONONUCLEAR NO ORGANISMS SEEN CYTOSPIN SMEAR    Culture   Final    NO GROWTH < 24 HOURS Performed at [facility], [place] [address], [place], [place] [address]    Report Status PENDING  Incomplete  Culture, fungus without smear     Status: None (Preliminary result)   Collection Time: 09/22/21  1:49 PM   Specimen: CSF; Cerebrospinal Fluid  Result Value Ref Range Status   Specimen Description CSF  Final   Special Requests NONE  Final   Culture   Final    NO FUNGUS ISOLATED AFTER 1 DAY Performed at [facility], [place] [address], [place], [place] [address]    Report Status PENDING  Incomplete  Anaerobic culture w Gram Stain      Status: None (Preliminary result)   Collection Time: 09/22/21  1:49 PM   Specimen: CSF; Cerebrospinal Fluid  Result Value Ref Range Status   Specimen Description CSF  Final   Special Requests NONE  Final   Gram Stain   Final    NO WBC SEEN NO ORGANISMS SEEN Performed at [facility], [place] [address], [place], [place] [address]    Culture PENDING  Incomplete   Report Status PENDING  Incomplete    Lipid Panel No results for input(s): "CHOL", "TRIG", "HDL", "CHOLHDL", "VLDL", "LDLCALC" in the last 72 hours.  Studies/Results: MR CERVICAL SPINE W WO CONTRAST  Result Date: 09/23/2021 CLINICAL DATA:  Demyelinating disease. EXAM: MRI CERVICAL AND THORACIC SPINE WITHOUT AND WITH CONTRAST TECHNIQUE: Multiplanar and multiecho pulse sequences of the cervical spine, to include the craniocervical junction and cervicothoracic junction, and the thoracic spine, were obtained without and with intravenous contrast. CONTRAST:  10 ml gadopiclenol COMPARISON:  None Available. FINDINGS: Limitations: Assessment is limited due to the degree of motion artifact, particularly on the axial T2 weighted sequences, which are essentially nondiagnostic. MRI CERVICAL SPINE FINDINGS Alignment: There is straightening of the normal cervical lordosis Vertebrae: No fracture, evidence of discitis, or bone lesion. Cord: Assessment of the spinal cord is markedly limited due to the degree of motion artifact. There is no definite evidence of a active demyelination in the cervical spinal cord. There may be a focal T2 hyperintense lesion in the cervical spinal cord at the C6 vertebral body level (series 6, image 8) and at the T1 vertebral body level (series 6, image 9), but these lesions are not confirmed on the axial sequences due to the degree of motion artifact. Posterior Fossa, vertebral arteries, paraspinal tissues: Negative. Disc levels: There is mild to moderate spinal canal stenosis at the C5-C6 level secondary to a disc  bulge. Assessment for neural foraminal stenosis is limited due to the degree of motion artifact on the axial sequences. Within this limitation, there is likely mild-to-moderate neural foraminal stenosis at the C5-C6 level. MRI THORACIC SPINE FINDINGS Alignment:  Physiologic. Vertebrae: No fracture, evidence of discitis, or bone lesion. Cord:  See above Paraspinal and other soft tissues: Negative. Disc levels: No evidence of high-grade spinal canal stenosis. Assessment for neural foraminal stenosis is limited due to the degree of motion artifact. IMPRESSION: Markedly limited exam for assessment of the spinal cord due to the degree of  motion artifact. Within this limitation, there is no definite evidence of active demyelination. Within limitations of motion artifact, there may be demyelinating lesions at the C6 and T1 vertebral body levels. Repeat exam is recommended if more definitive characterization as clinically warranted. Electronically Signed   By: Marin Roberts M.D.   On: 09/23/2021 15:02   MR THORACIC SPINE W WO CONTRAST  Result Date: 09/23/2021 CLINICAL DATA:  Demyelinating disease. EXAM: MRI CERVICAL AND THORACIC SPINE WITHOUT AND WITH CONTRAST TECHNIQUE: Multiplanar and multiecho pulse sequences of the cervical spine, to include the craniocervical junction and cervicothoracic junction, and the thoracic spine, were obtained without and with intravenous contrast. CONTRAST:  10 ml gadopiclenol COMPARISON:  None Available. FINDINGS: Limitations: Assessment is limited due to the degree of motion artifact, particularly on the axial T2 weighted sequences, which are essentially nondiagnostic. MRI CERVICAL SPINE FINDINGS Alignment: There is straightening of the normal cervical lordosis Vertebrae: No fracture, evidence of discitis, or bone lesion. Cord: Assessment of the spinal cord is markedly limited due to the degree of motion artifact. There is no definite evidence of a active demyelination in the cervical  spinal cord. There may be a focal T2 hyperintense lesion in the cervical spinal cord at the C6 vertebral body level (series 6, image 8) and at the T1 vertebral body level (series 6, image 9), but these lesions are not confirmed on the axial sequences due to the degree of motion artifact. Posterior Fossa, vertebral arteries, paraspinal tissues: Negative. Disc levels: There is mild to moderate spinal canal stenosis at the C5-C6 level secondary to a disc bulge. Assessment for neural foraminal stenosis is limited due to the degree of motion artifact on the axial sequences. Within this limitation, there is likely mild-to-moderate neural foraminal stenosis at the C5-C6 level. MRI THORACIC SPINE FINDINGS Alignment:  Physiologic. Vertebrae: No fracture, evidence of discitis, or bone lesion. Cord:  See above Paraspinal and other soft tissues: Negative. Disc levels: No evidence of high-grade spinal canal stenosis. Assessment for neural foraminal stenosis is limited due to the degree of motion artifact. IMPRESSION: Markedly limited exam for assessment of the spinal cord due to the degree of motion artifact. Within this limitation, there is no definite evidence of active demyelination. Within limitations of motion artifact, there may be demyelinating lesions at the C6 and T1 vertebral body levels. Repeat exam is recommended if more definitive characterization as clinically warranted. Electronically Signed   By: Marin Roberts M.D.   On: 09/23/2021 15:02    Medications: Scheduled:  amLODipine  10 mg Per Tube Daily   aspirin  81 mg Per Tube Daily   Chlorhexidine Gluconate Cloth  6 each Topical Daily   docusate  100 mg Per Tube Daily   feeding supplement (PROSource TF20)  60 mL Per Tube BID   free water  200 mL Per Tube Q8H   heparin  5,000 Units Subcutaneous Q8H   insulin aspart  0-20 Units Subcutaneous Q4H   insulin aspart  16 Units Subcutaneous Q4H   insulin glargine-yfgn  30 Units Subcutaneous BID   mouth rinse  15  mL Mouth Rinse Q2H   pantoprazole  40 mg Per Tube Daily   polyethylene glycol  17 g Per Tube Daily   rosuvastatin  40 mg Per Tube Daily   sodium chloride flush  10-40 mL Intracatheter Q12H   sodium chloride flush  5 mL Intracatheter Q8H   Zinc Oxide   Topical BID   Continuous:  sodium chloride Stopped (09/15/21 1519)  sodium chloride     dextrose     feeding supplement (VITAL 1.5 CAL) 60 mL/hr at 09/24/21 0600   lactated ringers     methylPREDNISolone (SOLU-MEDROL) injection Stopped (09/23/21 1549)     Assessment: Troy Randall with history of stroke in 4/23 with minimal residual deficits, HTN, HLD, DM and recent jejunal bleed, presents with continued altered mental status and concerning multifocal enhancing white matter lesions on MRI that have morphologies and a distribution suggestive of MS or ADEM, with vasculitis also a consideration, but lower on the differential diagnosis. He has no prior history of demyelinating disease and wife states no prior episodes of focal weakness or vision loss. Also with no family history of MS.   - Exams: - On exam today (Wednesday) no significant improvement is noted relative to Tuesday's assessment.  - On exam Tuesday, the Randall was able to intermittently follow simple commands, but with significantly increased latencies of motor responses. Also able to move all extremities semipurposefully as he engages in fidgeting/restless movements in the bed. He did wave "bye" with his right hand when examiner was preparing to leave the room.   - Imaging: - White matter changes on MRI with multiple foci of bland restricted diffusion may represent strokes, although pattern would be unusual. The locations and morphologies of the lesions are more consistent with demyelinating disease.   - MRI brain follow up post-contrast imaging reveals >20 contrast enhancing lesions in the white matter bilaterally. Strongly favor inflammation / active demyelination possibly MS  vs ADEM.  - MRI of cervical and thoracic spine w/wo contrast: Markedly limited exam for assessment of the spinal cord due to the degree of motion artifact. Within this limitation, there is no definite evidence of active demyelination. Within limitations of motion artifact, there may be demyelinating lesions at the C6 and T1 vertebral body levels. Repeat exam is recommended if more definitive characterization as clinically warranted. - Inflammation favored over infection given that he is afebrile and primarily altered but not ill-appearing therefore no indication for empiric CNS coverage at this time.  - LP was performed by CCM yesterday. CSF findings: - Glucose 126, RBC count 1-5 (unremarkable), clear, colorless, total protein normal, WBC normal.  - Gram stain: Negative - Fungal and bacterial cultures pending - Infectious disease [facility] panel negative for all pathogens tested, including cryptococcus, VZV and HSV.  - Cryptococcal Ag negative.  - IgG index is normal at 0.5.  - OCBs pending  - Serum labs:  - Vitamin B12 level consistent with supplementation. Mildly elevated Na. BUN 30. ALT 48. TSH normal. ANA negative.  - C3 complement level elevated at 184. Autoimmune panel otherwise negative.  - HIV screen negative.  - DDx for AMS: Likely has a delirium component to altered mental status given long hospitalization. The acute multifocal enhancing lesions could also be contributing.  - DDx for white matter lesions on MRI: ADEM, multiple sclerosis and cerebral vasculitis (isolated or as a component of a possible systemic vasculitis) are felt to be highest on the DDx.       Recommendations: - Continue to treat empirically with IV solumedrol as there is no e/o infection on LP. On day 2/5 of 1000 mg qd - OOB to chair when possible to reduce the risk of developing an ICU neuropathy or myopathy  35 minutes spent in the neurological evaluation and management of this critically ill Randall   LOS: 20  days   @Electronically  signed: Dr. Kerney Elbe 09/24/2021  7:42 AM

## 2021-09-24 NOTE — Progress Notes (Signed)
   09/24/21 2119  Assess: MEWS Score  Temp 97.8 F (36.6 C)  BP (!) 141/91  MAP (mmHg) 104  Pulse Rate (!) 121  Resp (!) 23  Level of Consciousness Alert  SpO2 97 %  O2 Device Tracheostomy Collar  O2 Flow Rate (L/min) 5 L/min  Assess: MEWS Score  MEWS Temp 0  MEWS Systolic 0  MEWS Pulse 2  MEWS RR 1  MEWS LOC 0  MEWS Score 3  MEWS Score Color Yellow  Assess: if the MEWS score is Yellow or Red  Were vital signs taken at a resting state? Yes  Focused Assessment No change from prior assessment  Does the patient meet 2 or more of the SIRS criteria? Yes  Does the patient have a confirmed or suspected source of infection? No  MEWS guidelines implemented *See Row Information* No, previously yellow, continue vital signs every 4 hours  Treat  MEWS Interventions Other (Comment) (MD notified)  Pain Scale PAINAD  Breathing 0  Negative Vocalization 0  Facial Expression 1  Body Language 1  Consolability 1  PAINAD Score 3  Take Vital Signs  Increase Vital Sign Frequency  Yellow: Q 2hr X 2 then Q 4hr X 2, if remains yellow, continue Q 4hrs  Escalate  MEWS: Escalate Yellow: discuss with charge nurse/RN and consider discussing with provider and RRT  Notify: Charge Nurse/RN  Name of Charge Nurse/RN Notified Blanch Media, RN  Date Charge Nurse/RN Notified 09/24/21  Time Charge Nurse/RN Notified 2153  Notify: Provider  Provider Name/Title Lorra Hals, NP  Date Provider Notified 09/24/21  Time Provider Notified 2153  Method of Notification Page  Notification Reason Other (Comment) (HR elevated)  Provider response Other (Comment) (no new orders at this time, see note)  Date of Provider Response 09/24/21  Time of Provider Response 2200  Assess: SIRS CRITERIA  SIRS Temperature  0  SIRS Pulse 1  SIRS Respirations  1  SIRS WBC 1  SIRS Score Sum  3   Pt HR in 110s, RR less than 20. No change in pt condition, no signs of acute distress, and no new orders at this time. Will continue to monitor   pt.

## 2021-09-24 NOTE — Inpatient Diabetes Management (Signed)
Inpatient Diabetes Program Recommendations  AACE/ADA: New Consensus Statement on Inpatient Glycemic Control (2015)  Target Ranges:  Prepandial:   less than 140 mg/dL      Peak postprandial:   less than 180 mg/dL (1-2 hours)      Critically ill patients:  140 - 180 mg/dL   Lab Results  Component Value Date   GLUCAP 358 (H) 09/24/2021   HGBA1C 6.2 (H) 04/29/2021    Review of Glycemic Control  Latest Reference Range & Units 09/23/21 19:40 09/23/21 23:40 09/24/21 03:14 09/24/21 07:44 09/24/21 11:20  Glucose-Capillary 70 - 99 mg/dL 225 (H) 301 (H) 335 (H) 258 (H) 358 (H)  (H): Data is abnormally high Current orders for Inpatient glycemic control: Semglee 30 units BID, Novolog 16 units Q4H, Novolog 0-20 units Q4H; Vital @ 60 ml/hr Solumedrol 1000 mg QD   Inpatient Diabetes Program Recommendations:     Consider: -Increasing Novolog 22 units Q4H for tube feed coverage (to be stopped or held in the event tube feeds are stopped). -Increasing Semglee 35 units BID.   Secure chat sent to MD.  Thanks, Bronson Curb, MSN, RNC-OB Diabetes Coordinator [phone] (8a-5p)

## 2021-09-25 DIAGNOSIS — K922 Gastrointestinal hemorrhage, unspecified: Secondary | ICD-10-CM | POA: Diagnosis not present

## 2021-09-25 DIAGNOSIS — G9340 Encephalopathy, unspecified: Secondary | ICD-10-CM | POA: Diagnosis not present

## 2021-09-25 LAB — BASIC METABOLIC PANEL
Anion gap: 6 (ref 5–15)
BUN: 35 mg/dL — ABNORMAL HIGH (ref 6–20)
CO2: 28 mmol/L (ref 22–32)
Calcium: 8.9 mg/dL (ref 8.9–10.3)
Chloride: 116 mmol/L — ABNORMAL HIGH (ref 98–111)
Creatinine, Ser: 0.82 mg/dL (ref 0.61–1.24)
GFR, Estimated: >60 mL/min (ref >60)
Glucose, Bld: 271 mg/dL — ABNORMAL HIGH (ref 70–99)
Potassium: 3.8 mmol/L (ref 3.5–5.1)
Sodium: 150 mmol/L — ABNORMAL HIGH (ref 135–145)

## 2021-09-25 LAB — VITAMIN B1: Vitamin B1 (Thiamine): 152.6 nmol/L (ref 66.5–200.0)

## 2021-09-25 LAB — GLUCOSE, CAPILLARY
Glucose-Capillary: 188 mg/dL — ABNORMAL HIGH (ref 70–99)
Glucose-Capillary: 198 mg/dL — ABNORMAL HIGH (ref 70–99)
Glucose-Capillary: 219 mg/dL — ABNORMAL HIGH (ref 70–99)
Glucose-Capillary: 236 mg/dL — ABNORMAL HIGH (ref 70–99)
Glucose-Capillary: 252 mg/dL — ABNORMAL HIGH (ref 70–99)
Glucose-Capillary: 261 mg/dL — ABNORMAL HIGH (ref 70–99)

## 2021-09-25 LAB — MAGNESIUM: Magnesium: 2.4 mg/dL (ref 1.7–2.4)

## 2021-09-25 LAB — CSF CULTURE W GRAM STAIN: Culture: NO GROWTH

## 2021-09-25 LAB — CBC
HCT: 37.6 % — ABNORMAL LOW (ref 39.0–52.0)
Hemoglobin: 11.7 g/dL — ABNORMAL LOW (ref 13.0–17.0)
MCH: 28.6 pg (ref 26.0–34.0)
MCHC: 31.1 g/dL (ref 30.0–36.0)
MCV: 91.9 fL (ref 80.0–100.0)
Platelets: 305 10*3/uL (ref 150–400)
RBC: 4.09 MIL/uL — ABNORMAL LOW (ref 4.22–5.81)
RDW: 14.7 % (ref 11.5–15.5)
WBC: 16.6 10*3/uL — ABNORMAL HIGH (ref 4.0–10.5)
nRBC: 0 % (ref 0.0–0.2)

## 2021-09-25 MED ORDER — FREE WATER
250.0000 mL | Freq: Four times a day (QID) | Status: DC
  Administered 2021-09-25 – 2021-09-26 (×5): 250 mL

## 2021-09-25 NOTE — Plan of Care (Signed)
  Problem: Education: Goal: Knowledge of General Education information will improve Description: Including pain rating scale, medication(s)/side effects and non-pharmacologic comfort measures Outcome: Progressing   Problem: Health Behavior/Discharge Planning: Goal: Ability to manage health-related needs will improve Outcome: Progressing   Problem: Clinical Measurements: Goal: Ability to maintain clinical measurements within normal limits will improve Outcome: Progressing Goal: Will remain free from infection Outcome: Progressing Goal: Diagnostic test results will improve Outcome: Progressing Goal: Respiratory complications will improve Outcome: Progressing Goal: Cardiovascular complication will be avoided Outcome: Progressing   Problem: Activity: Goal: Risk for activity intolerance will decrease Outcome: Progressing   Problem: Nutrition: Goal: Adequate nutrition will be maintained Outcome: Progressing   Problem: Coping: Goal: Level of anxiety will decrease Outcome: Progressing   Problem: Elimination: Goal: Will not experience complications related to bowel motility Outcome: Progressing Goal: Will not experience complications related to urinary retention Outcome: Progressing   Problem: Pain Managment: Goal: General experience of comfort will improve Outcome: Progressing   Problem: Safety: Goal: Ability to remain free from injury will improve Outcome: Progressing   Problem: Skin Integrity: Goal: Risk for impaired skin integrity will decrease Outcome: Progressing   Problem: Education: Goal: Ability to describe self-care measures that may prevent or decrease complications (Diabetes Survival Skills Education) will improve Outcome: Progressing Goal: Individualized Educational Video(s) Outcome: Progressing   Problem: Coping: Goal: Ability to adjust to condition or change in health will improve Outcome: Progressing   Problem: Fluid Volume: Goal: Ability to  maintain a balanced intake and output will improve Outcome: Progressing   Problem: Health Behavior/Discharge Planning: Goal: Ability to identify and utilize available resources and services will improve Outcome: Progressing Goal: Ability to manage health-related needs will improve Outcome: Progressing   Problem: Metabolic: Goal: Ability to maintain appropriate glucose levels will improve Outcome: Progressing   Problem: Nutritional: Goal: Maintenance of adequate nutrition will improve Outcome: Progressing Goal: Progress toward achieving an optimal weight will improve Outcome: Progressing   Problem: Skin Integrity: Goal: Risk for impaired skin integrity will decrease Outcome: Progressing   Problem: Tissue Perfusion: Goal: Adequacy of tissue perfusion will improve Outcome: Progressing   Problem: Education: Goal: Understanding of CV disease, CV risk reduction, and recovery process will improve Outcome: Progressing Goal: Individualized Educational Video(s) Outcome: Progressing   Problem: Activity: Goal: Ability to return to baseline activity level will improve Outcome: Progressing   Problem: Cardiovascular: Goal: Ability to achieve and maintain adequate cardiovascular perfusion will improve Outcome: Progressing Goal: Vascular access site(s) Level 0-1 will be maintained Outcome: Progressing   Problem: Health Behavior/Discharge Planning: Goal: Ability to safely manage health-related needs after discharge will improve Outcome: Progressing   Problem: Education: Goal: Knowledge about tracheostomy care/management will improve Outcome: Progressing   Problem: Activity: Goal: Ability to tolerate increased activity will improve Outcome: Progressing   Problem: Health Behavior/Discharge Planning: Goal: Ability to manage tracheostomy will improve Outcome: Progressing   Problem: Respiratory: Goal: Patent airway maintenance will improve Outcome: Progressing   Problem: Role  Relationship: Goal: Ability to communicate will improve Outcome: Progressing   Problem: Safety: Goal: Non-violent Restraint(s) Outcome: Progressing

## 2021-09-25 NOTE — Inpatient Diabetes Management (Addendum)
Inpatient Diabetes Program Recommendations  AACE/ADA: New Consensus Statement on Inpatient Glycemic Control (2015)  Target Ranges:  Prepandial:   less than 140 mg/dL      Peak postprandial:   less than 180 mg/dL (1-2 hours)      Critically ill patients:  140 - 180 mg/dL   Lab Results  Component Value Date   GLUCAP 252 (H) 09/25/2021   HGBA1C 6.2 (H) 04/29/2021    Review of Glycemic Control  Latest Reference Range & Units 09/24/21 07:44 09/24/21 11:20 09/24/21 17:05 09/24/21 19:59 09/25/21 00:00 09/25/21 04:30 09/25/21 07:49  Glucose-Capillary 70 - 99 mg/dL 258 (H)  Novolog 27 units  Semglee 30 units 358 (H)  Novolog 36 units 321 (H)  Novolog 37 units 339 (H)  Novolog 37 units 219 (H)  Novolog 29 units 198 (H)  Novolog 26 units 252 (H)  Novolog 33 units   Current orders for Inpatient glycemic control:  Semglee 35 units BID Novolog 22 units Q4H Novolog 0-20 units Q4H  Vital @ 60 ml/hr Solumedrol 1000 mg QD   Inpatient Diabetes Program Recommendations:     Based on amount of Novolog given over the last 24 hours (225 units of Novolog alone)  Consider: -   Increasing Semglee 45 units BID.    Thanks, Tama Headings RN, MSN, BC-ADM Inpatient Diabetes Coordinator Team Pager [phone] (8a-5p)

## 2021-09-25 NOTE — Progress Notes (Signed)
Occupational Therapy Treatment Patient Details Name: Troy Randall MRN: [id number] DOB: 1960-01-26 Today's Date: 09/25/2021   History of present illness Patient is a 61 y/o male who presents on 09/04/21 with dark stools, dizziness and SOB. Found to have GI bleed and acute anemia s/p embolization 09/06/21 and percutaneous cholecystostomy 09/07/21. Intubated 9/3, CRRT 9/5-9/8. Hemorrhagic and septic shock 9/5. Trach placed 9/11. 9/16 MRI rapid development numerous round white matter lesions in both cerebral hemispheres; 9/17 MRI: numerous white matter lesions consistent with active demyelination - concerning for MS, ADEM, and cerebral vasculitis; 9/18 Lumbar puncture. PMH includes CVA, DM, HTN.   OT comments  Patient seen to address transfers to recliner via maximove. Patient required max assist of one to roll side to side to place lift pad. Patient was transferred to recliner with maximove to recliner. Patient positioned in recliner with feet elevated and lateral support. Patient began to sacral sit and scoot hips forward shortly after getting into recliner and kept placing feet on floor. Patient appeared unsafe to remain sitting in recliner and was assisted back to bed. Patient would benefit from continued therapy in LTAC setting to increase mobility, cognition, and functional transfers.    Recommendations for follow up therapy are one component of a multi-disciplinary discharge planning process, led by the attending physician.  Recommendations may be updated based on patient status, additional functional criteria and insurance authorization.    Follow Up Recommendations  OT at Long-term acute care hospital    Assistance Recommended at Discharge Frequent or constant Supervision/Assistance  Patient can return home with the following  Two people to help with walking and/or transfers;Two people to help with bathing/dressing/bathroom;Direct supervision/assist for medications management;Direct  supervision/assist for financial management;Assist for transportation;Help with stairs or ramp for entrance;Assistance with feeding;Assistance with cooking/housework   Equipment Recommendations  Other (comment) (defer to next venue)    Recommendations for Other Services      Precautions / Restrictions Precautions Precautions: Fall Precaution Comments: TC. ,mitts, cortrak Restrictions Weight Bearing Restrictions: No       Mobility Bed Mobility Overal bed mobility: Needs Assistance Bed Mobility: Rolling Rolling: Max assist         General bed mobility comments: performed rolling in bed for placement of lift pad    Transfers Overall transfer level: Needs assistance Equipment used:  (maximove) Transfers: Bed to chair/wheelchair/BSC             General transfer comment: Patient was transferred from bed to recliner with maximove lift. Once positioned in recliner patient began sacral sitting and moving legs off recliner. Patient was deemed unsafe to remain in recliner and was assisted back to bed Transfer via Lift Equipment: [place] Overall balance assessment: Needs assistance Sitting-balance support: Feet supported Sitting balance-Leahy Scale: Poor Sitting balance - Comments: seated in recliner with poor posture, sacral sitting                                   ADL either performed or assessed with clinical judgement   ADL Overall ADL's : Needs assistance/impaired                                       General ADL Comments: focused on transfers to recliner    Extremity/Trunk Assessment  Vision       Perception     Praxis      Cognition Arousal/Alertness: Awake/alert (eyes open) Behavior During Therapy: Flat affect Overall Cognitive Status: Difficult to assess                                 General Comments: patient demonstrated unsafe sitting in recliner with placing legs on  either side of leg rest and sacral sitting        Exercises      Shoulder Instructions       General Comments      Pertinent Vitals/ Pain       Pain Assessment Pain Assessment: Faces Faces Pain Scale: Hurts a little bit Pain Location: Patient indicating possible sacral pain when sitting in recliner with patient scooting forward in chair. Pain Descriptors / Indicators: Grimacing, Discomfort Pain Intervention(s): Monitored during session  Home Living                                          Prior Functioning/Environment              Frequency  Min 2X/week        Progress Toward Goals  OT Goals(current goals can now be found in the care plan section)  Progress towards OT goals: Progressing toward goals  Acute Rehab OT Goals OT Goal Formulation: With family Time For Goal Achievement: 10/01/21 Potential to Achieve Goals: Fair ADL Goals Pt/caregiver will Perform Home Exercise Program: Increased strength;Both right and left upper extremity;With minimal assist Additional ADL Goal #1: Pt will follow one step commands 50% of time. Additional ADL Goal #2: Pt will demonstrate fair sitting balance at EOB x 5 minutes as a precursor to ADLs. Additional ADL Goal #3: Pt will roll with moderate assistance for pressure relief and pericare.  Plan Discharge plan remains appropriate    Co-evaluation                 AM-PAC OT "6 Clicks" Daily Activity     Outcome Measure   Help from another person eating meals?: Total Help from another person taking care of personal grooming?: A Lot Help from another person toileting, which includes using toliet, bedpan, or urinal?: Total Help from another person bathing (including washing, rinsing, drying)?: Total Help from another person to put on and taking off regular upper body clothing?: Total Help from another person to put on and taking off regular lower body clothing?: Total 6 Click Score: 7    End of  Session Equipment Utilized During Treatment: Oxygen;Other (comment) (maximove)  OT Visit Diagnosis: Muscle weakness (generalized) (M62.81);Other symptoms and signs involving cognitive function;Low vision, both eyes (H54.2)   Activity Tolerance Other (comment);Patient tolerated treatment well   Patient Left in bed;with call bell/phone within reach;with bed alarm set;with family/visitor present   Nurse Communication Need for lift equipment        Time: 1001-1026 OT Time Calculation (min): 25 min  Charges: OT General Charges $OT Visit: 1 Visit OT Treatments $Therapeutic Activity: 23-37 mins  Alfonse Flavors, OTA Acute Rehabilitation Services  Office [phone]   Dewain Penning 09/25/2021, 12:41 PM

## 2021-09-25 NOTE — Progress Notes (Signed)
Subjective: Awake and lying comfortably in bed. Staff is brushing his teeth. Patient waved to RN this AM when she came into his room.    Objective: Current vital signs: BP (!) 148/83 (BP Location: Left Arm)   Pulse (!) 106   Temp 97.9 F (36.6 C) (Axillary)   Resp 15   Ht 6' (1.829 m)   Wt 120.3 kg   SpO2 98%   BMI 35.97 kg/m  Vital signs in last 24 hours: Temp:  [97.8 F (36.6 C)-97.9 F (36.6 C)] 97.9 F (36.6 C) (09/21 0742) Pulse Rate:  [62-122] 106 (09/21 0800) Resp:  [14-28] 15 (09/21 0800) BP: (110-156)/(58-91) 148/83 (09/21 0742) SpO2:  [94 %-98 %] 98 % (09/21 0756) FiO2 (%):  [28 %] 28 % (09/21 0756)  Intake/Output from previous day: 09/20 0701 - 09/21 0700 In: 691 [NG/GT:620; IV Piggyback:66] Out: 930 [Urine:800; Drains:130] Intake/Output this shift: No intake/output data recorded. Nutritional status:  Diet Order             Diet NPO time specified  Diet effective now                  HEENT: [place]/AT. Trach collar is noted.  Ext: No edema   Neurologic Exam: Ment: Awake with eyes half-open, left more so than right. Does not fixate or track examiner during initial part of exam, but did gaze at examiner's face after wife asked him to wave goodbye. He also made eye contact with his wife during this exchange. Did not follows any of examiner's commands today and did not attempt to speak. Able to move all extremities semipurposefully as he engages in fidgeting/restless movements in the bed.    CN: Pupils 3 mm and reactive. Face is symmetric but with decreased tone to mouth/jaw.   Motor/Sensory: Weak movement of extremities to noxious at times, but at others without response to light noxious pinching. No asymmetry. Tone and bulk are normal. Did not follow commands for strength testing.  Reflexes: Low-amplitude 3+ brachioradialis and patellar reflexes Cerebellar/Gait: Unable to assess  Lab Results: Results for orders placed or performed during the hospital  encounter of 09/04/21 (from the past 48 hour(s))  Glucose, capillary     Status: Abnormal   Collection Time: 09/23/21 11:14 AM  Result Value Ref Range   Glucose-Capillary 164 (H) 70 - 99 mg/dL    Comment: Glucose reference range applies only to samples taken after fasting for at least 8 hours.  Glucose, capillary     Status: Abnormal   Collection Time: 09/23/21  2:09 PM  Result Value Ref Range   Glucose-Capillary 204 (H) 70 - 99 mg/dL    Comment: Glucose reference range applies only to samples taken after fasting for at least 8 hours.  Glucose, capillary     Status: Abnormal   Collection Time: 09/23/21  5:35 PM  Result Value Ref Range   Glucose-Capillary 226 (H) 70 - 99 mg/dL    Comment: Glucose reference range applies only to samples taken after fasting for at least 8 hours.  Glucose, capillary     Status: Abnormal   Collection Time: 09/23/21  7:40 PM  Result Value Ref Range   Glucose-Capillary 225 (H) 70 - 99 mg/dL    Comment: Glucose reference range applies only to samples taken after fasting for at least 8 hours.  Glucose, capillary     Status: Abnormal   Collection Time: 09/23/21 11:40 PM  Result Value Ref Range   Glucose-Capillary 301 (H) 70 -  99 mg/dL    Comment: Glucose reference range applies only to samples taken after fasting for at least 8 hours.  Glucose, capillary     Status: Abnormal   Collection Time: 09/24/21  3:14 AM  Result Value Ref Range   Glucose-Capillary 335 (H) 70 - 99 mg/dL    Comment: Glucose reference range applies only to samples taken after fasting for at least 8 hours.  Renal function panel     Status: Abnormal   Collection Time: 09/24/21  3:43 AM  Result Value Ref Range   Sodium 148 (H) 135 - 145 mmol/L   Potassium 4.2 3.5 - 5.1 mmol/L   Chloride 114 (H) 98 - 111 mmol/L   CO2 25 22 - 32 mmol/L   Glucose, Bld 348 (H) 70 - 99 mg/dL    Comment: Glucose reference range applies only to samples taken after fasting for at least 8 hours.   BUN 30 (H) 6  - 20 mg/dL   Creatinine, Ser 0.85 0.61 - 1.24 mg/dL   Calcium 8.9 8.9 - 10.3 mg/dL   Phosphorus 3.5 2.5 - 4.6 mg/dL   Albumin 2.3 (L) 3.5 - 5.0 g/dL   GFR, Estimated >60 >60 mL/min    Comment: (NOTE) Calculated using the CKD-EPI Creatinine Equation (2021)    Anion gap 9 5 - 15    Comment: Performed at [facility] [address], [place], [place] [address]  Glucose, capillary     Status: Abnormal   Collection Time: 09/24/21  7:44 AM  Result Value Ref Range   Glucose-Capillary 258 (H) 70 - 99 mg/dL    Comment: Glucose reference range applies only to samples taken after fasting for at least 8 hours.  CBC with Differential/Platelet     Status: Abnormal   Collection Time: 09/24/21  8:30 AM  Result Value Ref Range   WBC 15.1 (H) 4.0 - 10.5 K/uL   RBC 3.84 (L) 4.22 - 5.81 MIL/uL   Hemoglobin 10.9 (L) 13.0 - 17.0 g/dL   HCT 34.8 (L) 39.0 - 52.0 %   MCV 90.6 80.0 - 100.0 fL   MCH 28.4 26.0 - 34.0 pg   MCHC 31.3 30.0 - 36.0 g/dL   RDW 14.6 11.5 - 15.5 %   Platelets 278 150 - 400 K/uL   nRBC 0.0 0.0 - 0.2 %   Neutrophils Relative % 94 %   Neutro Abs 14.3 (H) 1.7 - 7.7 K/uL   Lymphocytes Relative 4 %   Lymphs Abs 0.6 (L) 0.7 - 4.0 K/uL   Monocytes Relative 1 %   Monocytes Absolute 0.1 0.1 - 1.0 K/uL   Eosinophils Relative 0 %   Eosinophils Absolute 0.0 0.0 - 0.5 K/uL   Basophils Relative 0 %   Basophils Absolute 0.0 0.0 - 0.1 K/uL   Immature Granulocytes 1 %   Abs Immature Granulocytes 0.07 0.00 - 0.07 K/uL    Comment: Performed at [facility], [address], [place], [place] [address]  Glucose, capillary     Status: Abnormal   Collection Time: 09/24/21 11:20 AM  Result Value Ref Range   Glucose-Capillary 358 (H) 70 - 99 mg/dL    Comment: Glucose reference range applies only to samples taken after fasting for at least 8 hours.  Glucose, capillary     Status: Abnormal   Collection Time: 09/24/21  5:05 PM  Result Value Ref Range   Glucose-Capillary 321 (H) 70 - 99  mg/dL    Comment: Glucose  reference range applies only to samples taken after fasting for at least 8 hours.  Glucose, capillary     Status: Abnormal   Collection Time: 09/24/21  7:59 PM  Result Value Ref Range   Glucose-Capillary 339 (H) 70 - 99 mg/dL    Comment: Glucose reference range applies only to samples taken after fasting for at least 8 hours.  Glucose, capillary     Status: Abnormal   Collection Time: 09/25/21 12:00 AM  Result Value Ref Range   Glucose-Capillary 219 (H) 70 - 99 mg/dL    Comment: Glucose reference range applies only to samples taken after fasting for at least 8 hours.  Glucose, capillary     Status: Abnormal   Collection Time: 09/25/21  4:30 AM  Result Value Ref Range   Glucose-Capillary 198 (H) 70 - 99 mg/dL    Comment: Glucose reference range applies only to samples taken after fasting for at least 8 hours.  Glucose, capillary     Status: Abnormal   Collection Time: 09/25/21  7:49 AM  Result Value Ref Range   Glucose-Capillary 252 (H) 70 - 99 mg/dL    Comment: Glucose reference range applies only to samples taken after fasting for at least 8 hours.    Recent Results (from the past 240 hour(s))  CSF culture w Gram Stain     Status: None (Preliminary result)   Collection Time: 09/22/21  1:48 PM   Specimen: CSF; Cerebrospinal Fluid  Result Value Ref Range Status   Specimen Description CSF  Final   Special Requests NONE  Final   Gram Stain   Final    WBC PRESENT, PREDOMINANTLY MONONUCLEAR NO ORGANISMS SEEN CYTOSPIN SMEAR    Culture   Final    NO GROWTH 2 DAYS Performed at [facility], [place] [address], [place], [place] [address]    Report Status PENDING  Incomplete  Culture, fungus without smear     Status: None (Preliminary result)   Collection Time: 09/22/21  1:49 PM   Specimen: CSF; Cerebrospinal Fluid  Result Value Ref Range Status   Specimen Description CSF  Final   Special Requests NONE  Final   Culture   Final    NO FUNGUS ISOLATED  AFTER 1 DAY Performed at [facility], [place] [address] [place], [place] [address]    Report Status PENDING  Incomplete  Anaerobic culture w Gram Stain     Status: None (Preliminary result)   Collection Time: 09/22/21  1:49 PM   Specimen: CSF; Cerebrospinal Fluid  Result Value Ref Range Status   Specimen Description CSF  Final   Special Requests NONE  Final   Gram Stain   Final    NO WBC SEEN NO ORGANISMS SEEN Performed at [facility], [place] [address] [place],  [address]    Culture   Final    NO ANAEROBES ISOLATED; CULTURE IN PROGRESS FOR 5 DAYS   Report Status PENDING  Incomplete    Lipid Panel No results for input(s): "CHOL", "TRIG", "HDL", "CHOLHDL", "VLDL", "LDLCALC" in the last 72 hours.  Studies/Results: MR CERVICAL SPINE W WO CONTRAST  Result Date: 09/23/2021 CLINICAL DATA:  Demyelinating disease. EXAM: MRI CERVICAL AND THORACIC SPINE WITHOUT AND WITH CONTRAST TECHNIQUE: Multiplanar and multiecho pulse sequences of the cervical spine, to include the craniocervical junction and cervicothoracic junction, and the thoracic spine, were obtained without and with intravenous contrast. CONTRAST:  10 ml gadopiclenol COMPARISON:  None Available. FINDINGS: Limitations: Assessment  is limited due to the degree of motion artifact, particularly on the axial T2 weighted sequences, which are essentially nondiagnostic. MRI CERVICAL SPINE FINDINGS Alignment: There is straightening of the normal cervical lordosis Vertebrae: No fracture, evidence of discitis, or bone lesion. Cord: Assessment of the spinal cord is markedly limited due to the degree of motion artifact. There is no definite evidence of a active demyelination in the cervical spinal cord. There may be a focal T2 hyperintense lesion in the cervical spinal cord at the C6 vertebral body level (series 6, image 8) and at the T1 vertebral body level (series 6, image 9), but these lesions are not confirmed on the axial sequences  due to the degree of motion artifact. Posterior Fossa, vertebral arteries, paraspinal tissues: Negative. Disc levels: There is mild to moderate spinal canal stenosis at the C5-C6 level secondary to a disc bulge. Assessment for neural foraminal stenosis is limited due to the degree of motion artifact on the axial sequences. Within this limitation, there is likely mild-to-moderate neural foraminal stenosis at the C5-C6 level. MRI THORACIC SPINE FINDINGS Alignment:  Physiologic. Vertebrae: No fracture, evidence of discitis, or bone lesion. Cord:  See above Paraspinal and other soft tissues: Negative. Disc levels: No evidence of high-grade spinal canal stenosis. Assessment for neural foraminal stenosis is limited due to the degree of motion artifact. IMPRESSION: Markedly limited exam for assessment of the spinal cord due to the degree of motion artifact. Within this limitation, there is no definite evidence of active demyelination. Within limitations of motion artifact, there may be demyelinating lesions at the C6 and T1 vertebral body levels. Repeat exam is recommended if more definitive characterization as clinically warranted. Electronically Signed   By: Marin Roberts M.D.   On: 09/23/2021 15:02   MR THORACIC SPINE W WO CONTRAST  Result Date: 09/23/2021 CLINICAL DATA:  Demyelinating disease. EXAM: MRI CERVICAL AND THORACIC SPINE WITHOUT AND WITH CONTRAST TECHNIQUE: Multiplanar and multiecho pulse sequences of the cervical spine, to include the craniocervical junction and cervicothoracic junction, and the thoracic spine, were obtained without and with intravenous contrast. CONTRAST:  10 ml gadopiclenol COMPARISON:  None Available. FINDINGS: Limitations: Assessment is limited due to the degree of motion artifact, particularly on the axial T2 weighted sequences, which are essentially nondiagnostic. MRI CERVICAL SPINE FINDINGS Alignment: There is straightening of the normal cervical lordosis Vertebrae: No fracture,  evidence of discitis, or bone lesion. Cord: Assessment of the spinal cord is markedly limited due to the degree of motion artifact. There is no definite evidence of a active demyelination in the cervical spinal cord. There may be a focal T2 hyperintense lesion in the cervical spinal cord at the C6 vertebral body level (series 6, image 8) and at the T1 vertebral body level (series 6, image 9), but these lesions are not confirmed on the axial sequences due to the degree of motion artifact. Posterior Fossa, vertebral arteries, paraspinal tissues: Negative. Disc levels: There is mild to moderate spinal canal stenosis at the C5-C6 level secondary to a disc bulge. Assessment for neural foraminal stenosis is limited due to the degree of motion artifact on the axial sequences. Within this limitation, there is likely mild-to-moderate neural foraminal stenosis at the C5-C6 level. MRI THORACIC SPINE FINDINGS Alignment:  Physiologic. Vertebrae: No fracture, evidence of discitis, or bone lesion. Cord:  See above Paraspinal and other soft tissues: Negative. Disc levels: No evidence of high-grade spinal canal stenosis. Assessment for neural foraminal stenosis is limited due to the degree of motion  artifact. IMPRESSION: Markedly limited exam for assessment of the spinal cord due to the degree of motion artifact. Within this limitation, there is no definite evidence of active demyelination. Within limitations of motion artifact, there may be demyelinating lesions at the C6 and T1 vertebral body levels. Repeat exam is recommended if more definitive characterization as clinically warranted. Electronically Signed   By: Marin Roberts M.D.   On: 09/23/2021 15:02    Medications: Scheduled:  amLODipine  10 mg Per Tube Daily   aspirin  81 mg Per Tube Daily   Chlorhexidine Gluconate Cloth  6 each Topical Daily   docusate  100 mg Per Tube Daily   feeding supplement (PROSource TF20)  60 mL Per Tube BID   free water  200 mL Per Tube Q6H    heparin  5,000 Units Subcutaneous Q8H   insulin aspart  0-20 Units Subcutaneous Q4H   insulin aspart  22 Units Subcutaneous Q4H   insulin glargine-yfgn  35 Units Subcutaneous BID   mouth rinse  15 mL Mouth Rinse Q2H   pantoprazole  40 mg Per Tube BID   polyethylene glycol  17 g Per Tube Daily   rosuvastatin  40 mg Per Tube Daily   sodium chloride flush  10-40 mL Intracatheter Q12H   sodium chloride flush  5 mL Intracatheter Q8H   Zinc Oxide   Topical BID   Continuous:  sodium chloride Stopped (09/15/21 1519)   sodium chloride     dextrose     feeding supplement (VITAL 1.5 CAL) 1,000 mL (09/25/21 0010)   lactated ringers     methylPREDNISolone (SOLU-MEDROL) injection Stopped (09/24/21 1013)    Assessment: 61 year old patient with history of stroke in April of this year with minimal residual deficits, HTN, HLD, DM and recent jejunal bleed, presented with continued altered mental status and concerning multifocal enhancing white matter lesions on MRI that exhibit morphologies and a distribution suggestive of MS or ADEM, with vasculitis also a consideration, but lower on the differential diagnosis. He has no prior history of demyelinating disease and wife states no prior episodes of focal weakness or vision loss. Also with no family history of MS. Spine imaging negative for cord lesions. Currently on IV Solumedrol as empiric treatment for the lesions.  - Exams: - On exam today (Wednesday) no significant improvement is noted relative to Tuesday's assessment.  - On exam Tuesday, the patient was able to intermittently follow simple commands, but with significantly increased latencies of motor responses. Also able to move all extremities semipurposefully as he engages in fidgeting/restless movements in the bed. He did wave "bye" with his right hand when examiner was preparing to leave the room.   - Imaging: - White matter changes on MRI with multiple foci of bland restricted diffusion may  represent strokes, although pattern would be unusual. The locations and morphologies of the lesions are more consistent with demyelinating disease.   - MRI brain follow up post-contrast imaging reveals >20 contrast enhancing lesions in the white matter bilaterally. Strongly favor inflammation / active demyelination possibly MS vs ADEM.  - MRI of cervical and thoracic spine w/wo contrast: Markedly limited exam for assessment of the spinal cord due to the degree of motion artifact. Within this limitation, there is no definite evidence of active demyelination. Within limitations of motion artifact, there may be demyelinating lesions at the C6 and T1 vertebral body levels. Repeat exam is recommended if more definitive characterization as clinically warranted. - Inflammation favored over infection  given that he is afebrile and primarily altered but not ill-appearing therefore no indication for empiric CNS coverage at this time.  - LP was performed by CCM on 9/18. CSF findings: - Glucose 126, RBC count 1-5 (unremarkable), clear, colorless, total protein normal, WBC normal.  - Gram stain: Negative - Fungal and bacterial cultures pending - Infectious disease [employer] panel negative for all pathogens tested, including cryptococcus, VZV and HSV.  - Cryptococcal Ag negative.  - IgG index is normal at 0.5.  - OCBs pending  - Serum labs:  - Vitamin B12 level consistent with supplementation. Mildly elevated Na. BUN 30. ALT 48. TSH normal. ANA negative.  - C3 complement level elevated at 184. Autoimmune panel otherwise negative.  - HIV screen negative.  - DDx for AMS: Most likely attributable to a CNS inflammatory process as evidenced by the enhancing lesions seen on brain MRI. Inflammation may not be restricted only to the regions of enhancement visible on MRI. 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  - DDx for white matter lesions on MRI: ADEM, multiple sclerosis and cerebral vasculitis (isolated or as a component of a possible systemic vasculitis) are felt to be highest on the DDx.  - There has been slight improvement after 2 days of IV steroid treatment, but it is unclear if this is due to the steroids.       Recommendations: - Continue to treat empirically with IV solumedrol as there is no e/o infection on LP. On day 3/5 of 1000 mg qd. Monitor for neurological improvement.  - OOB to chair when possible to reduce the risk of developing an ICU neuropathy or myopathy - Repeat MRI brain w/wo contrast should be ordered the day after he completes IV steroids to assess for possible resolution of the enhancing lesions. He has a vascular coil and surgical clips overlying the left hemiabdomen on recent abdominal x-ray and MRI techs should be reminded of this prior to scanning.   LOS: 21 days   @Electronically  signed: Dr. Kerney Elbe 09/25/2021  9:10 AM

## 2021-09-25 NOTE — Care Management Important Message (Signed)
Important Message  Patient Details  Name: Troy Randall MRN: [id number] Date of Birth: 1960-05-08   Medicare Important Message Given:  Yes     Nuri Larmer 09/25/2021, 11:23 AM

## 2021-09-25 NOTE — Consult Note (Signed)
WOC consulted for blanchable sacral wound; this WOC saw patient for same last week on 9/13; see consult note. Orders are in the computer and updated.    Re consult if needed, will not follow at this time. Thanks  Raydel Hosick [employer], RN,CWOCN, CNS, [place] [phone])

## 2021-09-25 NOTE — Progress Notes (Signed)
Patient seen today by trach team for consult.  No education is needed at this time.  All necessary equipment is at beside.   Will continue to follow for progression.  

## 2021-09-25 NOTE — Progress Notes (Signed)
PROGRESS NOTE    Troy Randall  EPP:[id number] DOB: 1960-11-23 DOA: 09/04/2021 PCP: [name], MD   Brief Narrative: 61 year old with past medical history significant for hypertension, hyperlipidemia, diabetes type 2, CVA who was admitted initially for GI bleed status post IR embolization followed by cholecystitis status post percutaneous cholecystostomy tube placement with ongoing altered mental status.  MRI of the brain with contrast showed numerous contrast-enhancing bilateral white matter lesions concerning for debilitating disease.  Nephrology consulted, underwent LP 9/18 with low suspicious for infectious etiology.  Neurology recommending high-dose IV steroids.  IV steroid started 9/19. Transfer to [facility].    Assessment & Plan:   Active Problems:   Uncontrolled type 2 diabetes mellitus with hyperglycemia, without long-term current use of insulin (HCC)   Essential hypertension   Hyperlipidemia   Chronic respiratory failure with hypoxia (HCC)   Tracheostomy status (HCC)   Acute encephalopathy   Anemia due to blood loss   History of lower GI bleeding: 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    H/O ischemic bowel disease   Acute emphysematous cholecystitis  1-Acute metabolic encephalopathy, questionable hypoxic ischemic encephalopathy, history of CVA without residual deficits -Patient was not following commands, continues to be lethargic.  Underwent LP on 9/18. -MRI brain: Numerous contrast-enhancing bilateral white matter lesions, consistent with active demyelination.  -MRI cervical spine, thoracic : Limited exam, within limitations there is not definitive evidence of active demyelinization. Within limitations of motion artifact, there may be demyelinating lesions at the C6 and T1 vertebral body levels. -CSF fluid: Cryptococcal antigen negative, HSV PCR pending, anaerobic culture: Pending, fungus culture no isolated, I GG CSF index pending VDRL CSF pending,  oligoclonal bands pending cultures: No growth today. -Patient was a started on high-dose IV steroids to treat for MS, day 3/5 -he will need repeat MRI after completion of IV steroids.  Slight improvement in MS today.    Acute hypoxic respiratory failure status post cutaneous tracheostomy 9/11: Status post tracheostomy 9/11 Continue with trach care  Hemorrhagic shock: ABLA secondary to lower GI bleed, jejunal diverticular and ulcer secondary to ischemia Acute blood loss and iron deficiency anemia secondary to above. -Status post 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 9/2 and bedside endoscopy 9/6.  Endoscopic capsule removal 9/15 -He received multiple blood transfusion during this hospitalization. -Need to monitor hemoglobin -Started baby  aspirin started 9/19 -Change PPI to twice daily due to high-dose steroids  E Coli Cholecystitis status post percutaneous cholecystostomy drain placement: Status post percutaneous cholecystostomy drain placed 9/3. Pleated 7 days of ceftriaxone Will need outpatient surgery and IR follow-up   AKI Of CRRT 9/8 Bilateral nonobstructive nephrolithiasis suspect chronic BPH, hyponatremia Nephrology signed off Renal function stable  Hypernatremia: Continue with free water. Repeat labs in am.    Diabetes type 2: Hyperglycemia, uncontrolled.  Continue with gargling and sliding scale and NovoLog Increase gargline and meals coverage.  Will need probably to reduce insulin doses after he complete IV steroids.   Hypertension, hyperlipidemia Continue with amlodipine. Lisinopril  on hold due to initial presentation of AKI Continue with Crestor  Pulmonary nodules in bilateral bases Never smoker.  Nutrition; on tube feeding   Nutrition Problem: Increased nutrient needs Etiology: acute illness    Signs/Symptoms: estimated needs    Interventions: Prostat, Tube feeding  Estimated body mass index is 35.97 kg/m as calculated from the  following:   Height as of this encounter: 6' (1.829 m).   Weight as of  this encounter: 120.3 kg.   DVT prophylaxis: heparin  Code Status: full code Family Communication: wife at bedside.  Disposition Plan:  Status is: Inpatient Remains inpatient appropriate because: management encephalopathy. He will need LTAC, I called to do Peer to peer. No called back yet     Consultants:  Neurology CCM IR  Procedures:  8/31 presented to [facility], [name] Admit, Gi Consult 9/1 CTA> no evidence of active GI bleed, 2 U PRBC 9/2 2 U PRBC, colonoscopy > no active bleed found but large clots throughout entire colon. EGD> small hiatal hernia, erosive gastropathy with no stigmata of recent bleeding, and non-bleeding duodenal diverticulum.   Underwent then small bowel enteroscopy which noted intermittent blood pumping in the proximal jejunum but was unable to be reached despite multiple attempts, was injected and clip placed. Repeat CTA repeated, which showed positive jejunal diverticular bleeding. IR> active extrav at [facility] territory, jejunal arcade branch, successful coil embo 9/3 Intubated. CTA> no evidence of active GI bleeding.  New finding of abnormal gallbladder with nondependent air, gallbladder wall air, pericholecystic and right upper quadrant information.  IR placed percutaneous cholecystotomy tube.  9/4 R. IJ central line placed with dialysis triple lumen catheter 9/5 CRRT initiated 9/6 Bedside EGD without any evidence of bleeding. CTA a/p without etiology for bleed 9/8 CRRT discontinued. Required ETT exchange yesterday due to blown cuff. 9/11 Bronchoscopic guided percutaneous tracheostomy placement.  Right IJ central line removed. 9/15 flexible sigmoidoscopy with removal of endoscopy 9/16 MRI brain w/o contrast amended study secondary to range of motion.  Rapid development numerous round white matter lesions in both cerebral hemispheres.  Prior MRIs. 9/17 MRI brain with contrast numerous  contrast-enhancing bilateral white matter lesions consistent with active demyelination 9/18 Lumbar puncture   Antimicrobials:    Subjective: He appears to be more alert. He moves his head to say no.  He wave his hand to say hi.   Objective: Vitals:   09/25/21 0742 09/25/21 0756 09/25/21 0800 09/25/21 1301  BP: (!) 148/83   (!) 149/64  Pulse: (!) 101 (!) 112 (!) 106 99  Resp: 18 (!) 28 15 18   Temp: 97.9 F (36.6 C)   98.5 F (36.9 C)  TempSrc: Axillary   Oral  SpO2: 98% 98%  99%  Weight:      Height:        Intake/Output Summary (Last 24 hours) at 09/25/2021 1309 Last data filed at 09/25/2021 0830 Gross per 24 hour  Intake 265 ml  Output 1280 ml  Net -1015 ml    Filed Weights   09/20/21 0500 09/21/21 0500 09/23/21 0500  Weight: 125.6 kg 122.1 kg 120.3 kg    Examination:  General exam: Alert Respiratory system: Trach in [place].  Cardiovascular system: S 1, S 2 RRR Gastrointestinal system: BS present, soft nt Central nervous system: alert  Data Reviewed: I have personally reviewed following labs and imaging studies  CBC: Recent Labs  Lab 09/19/21 0836 09/19/21 0942 09/21/21 1008 09/22/21 0938 09/24/21 0830 09/25/21 1028  WBC 10.1  --  10.0 8.4 15.1* 16.6*  NEUTROABS 8.4*  --  8.2* 6.6 14.3*  --   HGB 10.7* 10.5* 11.2* 10.1* 10.9* 11.7*  HCT 34.6* 31.0* 35.7* 32.3* 34.8* 37.6*  MCV 92.0  --  91.1 91.2 90.6 91.9  PLT 450*  --  366 264 278 [id number]    Basic Metabolic Panel: Recent Labs  Lab 09/19/21 0836 09/19/21 0942 09/21/21 1008 09/22/21 0937 09/23/21 0300 09/24/21 0343 09/25/21 1028  NA 145   < > 147* 149* 149* 148* 150*  K 3.7   < > 4.2 4.2 3.9 4.2 3.8  CL 110  --  114* 113* 114* 114* 116*  CO2 26  --  25 29 29 25 28   GLUCOSE 242*  --  323* 250* 214* 348* 271*  BUN 16  --  24* 31* 30* 30* 35*  CREATININE 0.76  --  0.83 0.97 0.83 0.85 0.82  CALCIUM 8.4*  --  8.6* 8.8* 8.7* 8.9 8.9  MG 2.1  --  2.2 2.2 2.3  --  2.4  PHOS 3.2  --  3.8 3.9 3.8  3.5  --    < > = values in this interval not displayed.    GFR: Estimated Creatinine Clearance: 129.9 mL/min (by C-G formula based on SCr of 0.82 mg/dL). Liver Function Tests: Recent Labs  Lab 09/20/21 1443 09/22/21 1348 09/24/21 0343  AST 25  --   --   ALT 48*  --   --   ALKPHOS 109  --   --   BILITOT 0.8  --   --   PROT 6.1*  --   --   ALBUMIN 2.2* 1.9* 2.3*    No results for input(s): "LIPASE", "AMYLASE" in the last 168 hours. Recent Labs  Lab 09/20/21 1443  AMMONIA 24    Coagulation Profile: No results for input(s): "INR", "PROTIME" in the last 168 hours. Cardiac Enzymes: No results for input(s): "CKTOTAL", "CKMB", "CKMBINDEX", "TROPONINI" in the last 168 hours. BNP (last 3 results) No results for input(s): "PROBNP" in the last 8760 hours. HbA1C: No results for input(s): "HGBA1C" in the last 72 hours. CBG: Recent Labs  Lab 09/24/21 1959 09/25/21 0000 09/25/21 0430 09/25/21 0749 09/25/21 1230  GLUCAP 339* 219* 198* 252* 188*    Lipid Profile: No results for input(s): "CHOL", "HDL", "LDLCALC", "TRIG", "CHOLHDL", "LDLDIRECT" in the last 72 hours. Thyroid Function Tests: No results for input(s): "TSH", "T4TOTAL", "FREET4", "T3FREE", "THYROIDAB" in the last 72 hours.  Anemia Panel: No results for input(s): "VITAMINB12", "FOLATE", "FERRITIN", "TIBC", "IRON", "RETICCTPCT" in the last 72 hours.  Sepsis Labs: No results for input(s): "PROCALCITON", "LATICACIDVEN" in the last 168 hours.  Recent Results (from the past 240 hour(s))  CSF culture w Gram Stain     Status: None   Collection Time: 09/22/21  1:48 PM   Specimen: CSF; Cerebrospinal Fluid  Result Value Ref Range Status   Specimen Description CSF  Final   Special Requests NONE  Final   Gram Stain   Final    WBC PRESENT, PREDOMINANTLY MONONUCLEAR NO ORGANISMS SEEN CYTOSPIN SMEAR    Culture   Final    NO GROWTH 3 DAYS Performed at [facility], [address] [place], [place] [address]     Report Status 09/25/2021 FINAL  Final  Culture, fungus without smear     Status: None (Preliminary result)   Collection Time: 09/22/21  1:49 PM   Specimen: CSF; Cerebrospinal Fluid  Result Value Ref Range Status   Specimen Description CSF  Final   Special Requests NONE  Final   Culture   Final    NO FUNGUS ISOLATED AFTER 1 DAY Performed at [facility], [place] [address], [place], [place] [address]    Report Status PENDING  Incomplete  Anaerobic culture w Gram Stain     Status: None (Preliminary result)   Collection Time: 09/22/21  1:49 PM   Specimen: CSF; Cerebrospinal Fluid  Result Value Ref Range Status   Specimen Description CSF  Final   Special Requests NONE  Final   Gram Stain   Final    NO WBC SEEN NO ORGANISMS SEEN Performed at [facility], [address] [place], [place] [address]    Culture   Final    NO ANAEROBES ISOLATED; CULTURE IN PROGRESS FOR 5 DAYS   Report Status PENDING  Incomplete         Radiology Studies: MR CERVICAL SPINE W WO CONTRAST  Result Date: 09/23/2021 CLINICAL DATA:  Demyelinating disease. EXAM: MRI CERVICAL AND THORACIC SPINE WITHOUT AND WITH CONTRAST TECHNIQUE: Multiplanar and multiecho pulse sequences of the cervical spine, to include the craniocervical junction and cervicothoracic junction, and the thoracic spine, were obtained without and with intravenous contrast. CONTRAST:  10 ml gadopiclenol COMPARISON:  None Available. FINDINGS: Limitations: Assessment is limited due to the degree of motion artifact, particularly on the axial T2 weighted sequences, which are essentially nondiagnostic. MRI CERVICAL SPINE FINDINGS Alignment: There is straightening of the normal cervical lordosis Vertebrae: No fracture, evidence of discitis, or bone lesion. Cord: Assessment of the spinal cord is markedly limited due to the degree of motion artifact. There is no definite evidence of a active demyelination in the cervical spinal cord. There may be a  focal T2 hyperintense lesion in the cervical spinal cord at the C6 vertebral body level (series 6, image 8) and at the T1 vertebral body level (series 6, image 9), but these lesions are not confirmed on the axial sequences due to the degree of motion artifact. Posterior Fossa, vertebral arteries, paraspinal tissues: Negative. Disc levels: There is mild to moderate spinal canal stenosis at the C5-C6 level secondary to a disc bulge. Assessment for neural foraminal stenosis is limited due to the degree of motion artifact on the axial sequences. Within this limitation, there is likely mild-to-moderate neural foraminal stenosis at the C5-C6 level. MRI THORACIC SPINE FINDINGS Alignment:  Physiologic. Vertebrae: No fracture, evidence of discitis, or bone lesion. Cord:  See above Paraspinal and other soft tissues: Negative. Disc levels: No evidence of high-grade spinal canal stenosis. Assessment for neural foraminal stenosis is limited due to the degree of motion artifact. IMPRESSION: Markedly limited exam for assessment of the spinal cord due to the degree of motion artifact. Within this limitation, there is no definite evidence of active demyelination. Within limitations of motion artifact, there may be demyelinating lesions at the C6 and T1 vertebral body levels. Repeat exam is recommended if more definitive characterization as clinically warranted. Electronically Signed   By: Marin Roberts M.D.   On: 09/23/2021 15:02   MR THORACIC SPINE W WO CONTRAST  Result Date: 09/23/2021 CLINICAL DATA:  Demyelinating disease. EXAM: MRI CERVICAL AND THORACIC SPINE WITHOUT AND WITH CONTRAST TECHNIQUE: Multiplanar and multiecho pulse sequences of the cervical spine, to include the craniocervical junction and cervicothoracic junction, and the thoracic spine, were obtained without and with intravenous contrast. CONTRAST:  10 ml gadopiclenol COMPARISON:  None Available. FINDINGS: Limitations: Assessment is limited due to the degree of  motion artifact, particularly on the axial T2 weighted sequences, which are essentially nondiagnostic. MRI CERVICAL SPINE FINDINGS Alignment: There is straightening of the normal cervical lordosis Vertebrae: No fracture, evidence of discitis, or bone lesion. Cord: Assessment of the spinal cord is markedly limited due to the degree of motion artifact. There is no definite evidence of a active demyelination in the cervical spinal cord. There may be a focal T2 hyperintense  lesion in the cervical spinal cord at the C6 vertebral body level (series 6, image 8) and at the T1 vertebral body level (series 6, image 9), but these lesions are not confirmed on the axial sequences due to the degree of motion artifact. Posterior Fossa, vertebral arteries, paraspinal tissues: Negative. Disc levels: There is mild to moderate spinal canal stenosis at the C5-C6 level secondary to a disc bulge. Assessment for neural foraminal stenosis is limited due to the degree of motion artifact on the axial sequences. Within this limitation, there is likely mild-to-moderate neural foraminal stenosis at the C5-C6 level. MRI THORACIC SPINE FINDINGS Alignment:  Physiologic. Vertebrae: No fracture, evidence of discitis, or bone lesion. Cord:  See above Paraspinal and other soft tissues: Negative. Disc levels: No evidence of high-grade spinal canal stenosis. Assessment for neural foraminal stenosis is limited due to the degree of motion artifact. IMPRESSION: Markedly limited exam for assessment of the spinal cord due to the degree of motion artifact. Within this limitation, there is no definite evidence of active demyelination. Within limitations of motion artifact, there may be demyelinating lesions at the C6 and T1 vertebral body levels. Repeat exam is recommended if more definitive characterization as clinically warranted. Electronically Signed   By: Marin Roberts M.D.   On: 09/23/2021 15:02        Scheduled Meds:  amLODipine  10 mg Per Tube  Daily   aspirin  81 mg Per Tube Daily   Chlorhexidine Gluconate Cloth  6 each Topical Daily   docusate  100 mg Per Tube Daily   feeding supplement (PROSource TF20)  60 mL Per Tube BID   free water  200 mL Per Tube Q6H   heparin  5,000 Units Subcutaneous Q8H   insulin aspart  0-20 Units Subcutaneous Q4H   insulin aspart  22 Units Subcutaneous Q4H   insulin glargine-yfgn  35 Units Subcutaneous BID   mouth rinse  15 mL Mouth Rinse Q2H   pantoprazole  40 mg Per Tube BID   polyethylene glycol  17 g Per Tube Daily   rosuvastatin  40 mg Per Tube Daily   sodium chloride flush  10-40 mL Intracatheter Q12H   sodium chloride flush  5 mL Intracatheter Q8H   Zinc Oxide   Topical BID   Continuous Infusions:  sodium chloride Stopped (09/15/21 1519)   sodium chloride     dextrose     feeding supplement (VITAL 1.5 CAL) 1,000 mL (09/25/21 0010)   lactated ringers     methylPREDNISolone (SOLU-MEDROL) injection 1,000 mg (09/25/21 1042)     LOS: 21 days    Time spent: 35 minutes    Nimai Burbach A Sakara Lehtinen, MD [facility]   If 7PM-7AM, please contact night-coverage [email or web address]  09/25/2021, 1:09 PM

## 2021-09-25 NOTE — Progress Notes (Signed)
Sacral wound present/blanchable, but odor present. Pt repositioned q 2 hr through the night, and order for wound care consult placed.

## 2021-09-26 DIAGNOSIS — A419 Sepsis, unspecified organism: Secondary | ICD-10-CM | POA: Diagnosis not present

## 2021-09-26 DIAGNOSIS — A498 Other bacterial infections of unspecified site: Secondary | ICD-10-CM | POA: Diagnosis not present

## 2021-09-26 DIAGNOSIS — R578 Other shock: Secondary | ICD-10-CM | POA: Diagnosis not present

## 2021-09-26 DIAGNOSIS — J9611 Chronic respiratory failure with hypoxia: Secondary | ICD-10-CM

## 2021-09-26 DIAGNOSIS — D62 Acute posthemorrhagic anemia: Secondary | ICD-10-CM | POA: Diagnosis not present

## 2021-09-26 DIAGNOSIS — I10 Essential (primary) hypertension: Secondary | ICD-10-CM | POA: Diagnosis not present

## 2021-09-26 DIAGNOSIS — K922 Gastrointestinal hemorrhage, unspecified: Secondary | ICD-10-CM | POA: Diagnosis not present

## 2021-09-26 DIAGNOSIS — E1165 Type 2 diabetes mellitus with hyperglycemia: Secondary | ICD-10-CM | POA: Diagnosis not present

## 2021-09-26 DIAGNOSIS — Z8719 Personal history of other diseases of the digestive system: Secondary | ICD-10-CM

## 2021-09-26 DIAGNOSIS — G9340 Encephalopathy, unspecified: Secondary | ICD-10-CM | POA: Diagnosis not present

## 2021-09-26 DIAGNOSIS — K810 Acute cholecystitis: Secondary | ICD-10-CM | POA: Diagnosis not present

## 2021-09-26 DIAGNOSIS — N179 Acute kidney failure, unspecified: Secondary | ICD-10-CM | POA: Diagnosis not present

## 2021-09-26 LAB — CBC WITH DIFFERENTIAL/PLATELET
Abs Immature Granulocytes: 0.11 10*3/uL — ABNORMAL HIGH (ref 0.00–0.07)
Basophils Absolute: 0 10*3/uL (ref 0.0–0.1)
Basophils Relative: 0 %
Eosinophils Absolute: 0 10*3/uL (ref 0.0–0.5)
Eosinophils Relative: 0 %
HCT: 38.7 % — ABNORMAL LOW (ref 39.0–52.0)
Hemoglobin: 11.8 g/dL — ABNORMAL LOW (ref 13.0–17.0)
Immature Granulocytes: 1 %
Lymphocytes Relative: 4 %
Lymphs Abs: 0.6 10*3/uL — ABNORMAL LOW (ref 0.7–4.0)
MCH: 28.4 pg (ref 26.0–34.0)
MCHC: 30.5 g/dL (ref 30.0–36.0)
MCV: 93 fL (ref 80.0–100.0)
Monocytes Absolute: 0.5 10*3/uL (ref 0.1–1.0)
Monocytes Relative: 4 %
Neutro Abs: 12.4 10*3/uL — ABNORMAL HIGH (ref 1.7–7.7)
Neutrophils Relative %: 91 %
Platelets: 268 10*3/uL (ref 150–400)
RBC: 4.16 MIL/uL — ABNORMAL LOW (ref 4.22–5.81)
RDW: 14.7 % (ref 11.5–15.5)
WBC: 13.6 10*3/uL — ABNORMAL HIGH (ref 4.0–10.5)
nRBC: 0 % (ref 0.0–0.2)

## 2021-09-26 LAB — GLUCOSE, CAPILLARY
Glucose-Capillary: 166 mg/dL — ABNORMAL HIGH (ref 70–99)
Glucose-Capillary: 190 mg/dL — ABNORMAL HIGH (ref 70–99)
Glucose-Capillary: 217 mg/dL — ABNORMAL HIGH (ref 70–99)
Glucose-Capillary: 269 mg/dL — ABNORMAL HIGH (ref 70–99)
Glucose-Capillary: 292 mg/dL — ABNORMAL HIGH (ref 70–99)
Glucose-Capillary: 317 mg/dL — ABNORMAL HIGH (ref 70–99)

## 2021-09-26 LAB — COMPREHENSIVE METABOLIC PANEL
ALT: 40 U/L (ref 0–44)
AST: 29 U/L (ref 15–41)
Albumin: 2.5 g/dL — ABNORMAL LOW (ref 3.5–5.0)
Alkaline Phosphatase: 100 U/L (ref 38–126)
Anion gap: 10 (ref 5–15)
BUN: 36 mg/dL — ABNORMAL HIGH (ref 6–20)
CO2: 24 mmol/L (ref 22–32)
Calcium: 8.8 mg/dL — ABNORMAL LOW (ref 8.9–10.3)
Chloride: 116 mmol/L — ABNORMAL HIGH (ref 98–111)
Creatinine, Ser: 0.87 mg/dL (ref 0.61–1.24)
GFR, Estimated: >60 mL/min (ref >60)
Glucose, Bld: 242 mg/dL — ABNORMAL HIGH (ref 70–99)
Potassium: 4 mmol/L (ref 3.5–5.1)
Sodium: 150 mmol/L — ABNORMAL HIGH (ref 135–145)
Total Bilirubin: 0.6 mg/dL (ref 0.3–1.2)
Total Protein: 6.4 g/dL — ABNORMAL LOW (ref 6.5–8.1)

## 2021-09-26 LAB — PHOSPHORUS: Phosphorus: 3.7 mg/dL (ref 2.5–4.6)

## 2021-09-26 LAB — MAGNESIUM: Magnesium: 2.5 mg/dL — ABNORMAL HIGH (ref 1.7–2.4)

## 2021-09-26 LAB — OLIGOCLONAL BANDS, CSF + SERM

## 2021-09-26 MED ORDER — FREE WATER
400.0000 mL | Freq: Four times a day (QID) | Status: DC
  Administered 2021-09-27 – 2021-10-06 (×32): 400 mL

## 2021-09-26 MED ORDER — INSULIN ASPART 100 UNIT/ML IJ SOLN
25.0000 [IU] | INTRAMUSCULAR | Status: DC
  Administered 2021-09-26 – 2021-09-27 (×7): 25 [IU] via SUBCUTANEOUS

## 2021-09-26 NOTE — Progress Notes (Signed)
Troy Randall:[id number] DOB: [birth year] DOA: 09/04/2021 PCP: Benita Stabile, MD   Subj: 61 year old WM PMHx HTN, HLD, DM type II uncontrolled with Hyperglycemia, CVA, s/p Hernia Repair with mesh 2011 at [facility].  Admitted initially for GI bleed status post IR embolization followed by cholecystitis status post percutaneous cholecystostomy tube placement with ongoing altered mental status.  MRI of the brain with contrast showed numerous contrast-enhancing bilateral white matter lesions concerning for debilitating disease.  Nephrology consulted, underwent LP 9/18 with low suspicious for infectious etiology.  Neurology recommending high-dose IV steroids.  IV steroid started 9/19. Transfer to [facility] 9/20.   Obj: A/O x0, will turn towards you occasionally if you shout his name Troy Randall.  Follows no commands.  Spontaneously moves all extremities.   Objective: VITAL SIGNS: Temp: 98 F (36.7 C) (09/22 0804) Temp Source: Oral (09/22 0804) BP: 163/94 (09/22 1122) Pulse Rate: 115 (09/22 1122) SPO2; FIO2:   Intake/Output Summary (Last 24 hours) at 09/26/2021 1130 Last data filed at 09/26/2021 0600 Gross per 24 hour  Intake 3075 ml  Output 2400 ml  Net 675 ml     Exam: General: A/O x0 No acute respiratory distress Lungs: Clear to auscultation bilaterally without wheezes or crackles Cardiovascular: Regular rate and rhythm without murmur gallop or rub normal S1 and S2 Abdomen: Nontender, nondistended, soft, bowel sounds positive, no rebound, no ascites, no appreciable mass, cholecystostomy tube in place with brownish discharge. Extremities: No significant cyanosis, clubbing, or edema bilateral lower extremities Skin: Negative rashes, lesions, ulcers Psychiatric: Unable to evaluate secondary to altered mental status Central nervous system: Spontaneously moves all extremities, unable to evaluate secondary to altered mental status  .   SCD Mobility Assessment (last 72 hours)      Mobility Assessment     Row Name 09/26/21 0143 09/25/21 2028 09/25/21 1200 09/25/21 0830 09/24/21 2300   Does patient have an order for bedrest or is patient medically unstable No - Continue assessment No - Continue assessment -- No - Continue assessment No - Continue assessment   What is the highest level of mobility based on the progressive mobility assessment? Level 1 (Bedfast) - Unable to balance while sitting on edge of bed Level 1 (Bedfast) - Unable to balance while sitting on edge of bed Level 1 (Bedfast) - Unable to balance while sitting on edge of bed Level 1 (Bedfast) - Unable to balance while sitting on edge of bed Level 1 (Bedfast) - Unable to balance while sitting on edge of bed   Is the above level different from baseline mobility prior to current illness? Yes - Recommend PT order Yes - Recommend PT order -- Yes - Recommend PT order Yes - Recommend PT order    Row Name 09/23/21 1300 09/23/21 1207         What is the highest level of mobility based on the progressive mobility assessment? Level 1 (Bedfast) - Unable to balance while sitting on edge of bed Level 1 (Bedfast) - Unable to balance while sitting on edge of bed                 DVT prophylaxis: SCD Code Status: Full Family Communication: 9/22 wife and daughter at bedside for discussion of plan of care all questions answered Status is: Inpatient    Dispo: The patient is from: Home              Anticipated d/c is to: Home  Anticipated d/c date is: > 3 days              Patient currently is not medically stable to d/c.    Procedures/Significant Events: 8/31 presented to [facility] ED, [name] Admit, Gi Consult 9/1 CTA> no evidence of active GI bleed, 2 U PRBC 9/2 2 U PRBC, colonoscopy > no active bleed found but large clots throughout entire colon. EGD> small hiatal hernia, erosive gastropathy with no stigmata of recent bleeding, and non-bleeding duodenal diverticulum.   Underwent then small bowel enteroscopy  which noted intermittent blood pumping in the proximal jejunum but was unable to be reached despite multiple attempts, was injected and clip placed. Repeat CTA repeated, which showed positive jejunal diverticular bleeding. IR> active extrav at [facility], jejunal arcade branch, successful coil embo 9/3 Intubated. CTA> no evidence of active GI bleeding.  New finding of abnormal gallbladder with nondependent air, gallbladder wall air, pericholecystic and right upper quadrant information.  IR placed percutaneous cholecystotomy tube.  9/4 R. IJ central line placed with dialysis triple lumen catheter 9/5 CRRT initiated 9/6 Bedside EGD without any evidence of bleeding. CTA a/p without etiology for bleed 9/8 CRRT discontinued. Required ETT exchange yesterday due to blown cuff. 9/11 Bronchoscopic guided percutaneous tracheostomy placement.  Right IJ central line removed. 9/15 flexible sigmoidoscopy with removal of endoscopy 9/16 MRI brain w/o contrast amended study secondary to range of motion.  Rapid development numerous round white matter lesions in both cerebral hemispheres.  Prior MRIs. 9/17 MRI brain with contrast numerous contrast-enhancing bilateral white matter lesions consistent with active demyelination 9/18 Lumbar puncture   -MRI brain: Numerous contrast-enhancing bilateral white matter lesions, consistent with active demyelination.  -MRI cervical spine, thoracic : Limited exam, within limitations there is not definitive evidence of active demyelinization. Within limitations of motion artifact, there may be demyelinating lesions at the C6 and T1 vertebral body levels.  Consultants:  Neurology PCCM IR   Cultures   Antimicrobials: Anti-infectives (From admission, onward)    Start     Dose/Rate Route Frequency Ordered Stop   09/11/21 1100  cefTRIAXone (ROCEPHIN) 2 g in sodium chloride 0.9 % 100 mL IVPB        2 g 200 mL/hr over 30 Minutes Intravenous Every 24 hours 09/11/21 1010  09/13/21 1108   09/08/21 0200  piperacillin-tazobactam (ZOSYN) IVPB 3.375 g  Status:  Discontinued        3.375 g 12.5 mL/hr over 240 Minutes Intravenous Every 8 hours 09/07/21 1856 09/07/21 2114   09/07/21 2200  meropenem (MERREM) 1 g in sodium chloride 0.9 % 100 mL IVPB  Status:  Discontinued        1 g 200 mL/hr over 30 Minutes Intravenous Every 8 hours 09/07/21 2114 09/11/21 1010   09/07/21 1945  piperacillin-tazobactam (ZOSYN) IVPB 3.375 g        3.375 g 100 mL/hr over 30 Minutes Intravenous  Once 09/07/21 1856 09/07/21 2005   09/07/21 1753  cefTRIAXone (ROCEPHIN) injection         Intravenous As needed 09/07/21 1754 09/07/21 1753   09/07/21 1749  sodium chloride 0.9 % with cefTRIAXone (ROCEPHIN) ADS Med       Note to Pharmacy: Harlow Asa E: cabinet override      09/07/21 1749 09/07/21 1935        A/P  -Acute Metabolic Encephalopathy/Hypoxic ischemic Encephalopathy/ Hx CVA wo residual deficits -Patient was not following commands, continues to be lethargic.   -S/p  LP on 9/18. -CSF fluid:  Cryptococcal antigen negative, HSV PCR pending, anaerobic culture: Pending, fungus culture no isolated, I GG CSF index pending VDRL CSF pending, oligoclonal bands pending cultures: No growth today. -MRI brain and spine consistent with MS   -Patient was a started on high-dose IV steroids to treat for MS, day 4/5 -he will need repeat MRI after completion of IV steroids.  -Family members state some purposeful movement.   Acute hypoxic respiratory failure status post cutaneous tracheostomy 9/11: -9/11 s/p Tracheostomy  -Continue with trach care   Hemorrhagic shock/ABLA/ secondary to lower GI bleed, jejunal diverticular and ulcer secondary to ischemia Acute blood loss and iron deficiency anemia secondary to above. -9/2 s/p status post 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  -bedside endoscopy  -9/15 Endoscopic capsule removal  -He received multiple blood transfusion during this  hospitalization. -9/19 baby ASA  -Protonix 40 mg BID due to high-dose steroids   E Coli Cholecystitis  -9/3 s/p percutaneous cholecystostomy drain placement: -Completed 7 days ceftriaxone -Follow-up outpatient surgery and IR  AKI -9/8 off CRRT Lab Results  Component Value Date   CREATININE 0.87 09/26/2021   CREATININE 0.82 09/25/2021   CREATININE 0.85 09/24/2021   CREATININE 0.83 09/23/2021   CREATININE 0.97 09/22/2021  -Resolved -Nephrology signed off  Hypernatremia:   -9/22 increase free water 420ml q6hr  DM type II uncontrolled with Hyperglycemia -4/25 hemoglobin A1c= 6.2 CBG (last 3)  Recent Labs    09/26/21 0801 09/26/21 1200 09/26/21 1611  GLUCAP 292* 217* 190*  -Semglee 35 units BID -9/22 increase NovoLog 25 units q 4 hr - Resistant SSI -Reduce insulin dose after completion of IV steroids  Essential HTN - Amlodipine 10 mg daily -   Pulmonary nodules in bilateral bases Never smoker.  Obesity (BMI 36.84 kg/m.)   Nutrition;  -Patiently being fed via NG tube. - NG tube placed on 9/12 - Technically NG tube can be used for 4-6 weeks, however another risk factor for additional infection -Patient s/p hernia repair with mesh 2011 at [facility], will consult surgery given patient's complicated multiple diagnosis.  Is placement of a PEG tube possible?          Care during the described time interval was provided by me .  I have reviewed this patient's available data, including medical history, events of note, physical examination, and all test results as part of my evaluation.

## 2021-09-26 NOTE — Progress Notes (Signed)
Nutrition Follow-up  DOCUMENTATION CODES:   Obesity unspecified  INTERVENTION:  Continue TF via postpyloric Cortrak: Vital 1.5 increasing by 10 ml every 8 hours to goal rate of 60 ml/h (1440 ml per day) Prosource TF20 60 ml BID  Provides 2320 kcal, 137 gm protein, 1003 ml free water daily   NUTRITION DIAGNOSIS:   Increased nutrient needs related to acute illness as evidenced by estimated needs. - remains applicable  GOAL:   Patient will meet greater than or equal to 90% of their needs - progressing, being met with TF  MONITOR:   TF tolerance  REASON FOR ASSESSMENT:   Consult Enteral/tube feeding initiation and management  ASSESSMENT:   Pt with PMH of HTN, HLD, DM, CVA on ASA/plavix admitted 8/31 from [facility] due to gi bleed.  8/31 admit 9/2 s/p colonoscopy: no active bleed but large clots throughout entire colon; s/p EGD: small hital hernia, erosive gastropathy, non-bleeding duodenal diverticulum; s/p small bowel enteroscopy: jejunal diverticular bleeding s/p coil embolization  9/3 intubated; new cholecystitis s/p IR for cholecystectomy tube  9/4 CRRT started  9/8 CRRT stopped  9/11 trach placed 9/12 Cortrak placed with tip in the descending duodenum 9/15 - flexible sigmoidoscopy 9/18 - LP  Pt continues with TF infusing at goal rate for nutrition. Has been working with SLP on PMV trials, but unable to attempt POs and has not be able to phonate on command.   Cortrak has been present for 10 days, if diet is not anticipated to be advanced in the next week would recommend considering long-term route for nutrition.   Average Meal Intake: 9/1: 8% intake x 2 recorded meals  Nutritionally Relevant Medications: Scheduled Meds:  docusate  100 mg Per Tube Daily   feeding supplement (PROSource TF20)  60 mL Per Tube BID   free water  250 mL Per Tube Q6H   insulin aspart  0-20 Units Subcutaneous Q4H   insulin aspart  22 Units Subcutaneous Q4H   insulin glargine-yfgn  35  Units Subcutaneous BID   pantoprazole  40 mg Per Tube BID   polyethylene glycol  17 g Per Tube Daily   rosuvastatin  40 mg Per Tube Daily   Continuous Infusions:  feeding supplement (VITAL 1.5 CAL) 1,000 mL (09/25/21 0010)   methylPREDNISolone (SOLU-MEDROL) injection 1,000 mg (09/26/21 1035)   Labs Reviewed: Na 150, chloride 116 BUN 35 CBG ranges from 188-317 mg/dL over the last 24 hours HgbA1c 6.2%  NUTRITION - FOCUSED PHYSICAL EXAM: Flowsheet Row Most Recent Value  Orbital Region No depletion  Upper Arm Region No depletion  Thoracic and Lumbar Region No depletion  Buccal Region No depletion  Temple Region No depletion  Clavicle Bone Region No depletion  Clavicle and Acromion Bone Region No depletion  Scapular Bone Region No depletion  Dorsal Hand No depletion  Patellar Region No depletion  Anterior Thigh Region No depletion  Posterior Calf Region No depletion  Edema (RD Assessment) Severe  Hair Reviewed  Eyes Unable to assess  Mouth Unable to assess  Skin Reviewed  Nails Reviewed    Diet Order:   Diet Order             Diet NPO time specified  Diet effective now                   EDUCATION NEEDS:   No education needs have been identified at this time  Skin:  Skin Assessment: Reviewed RN Assessment (MASD to rectum)  Last BM:  9/20 -  type 6  Height:  Ht Readings from Last 1 Encounters:  09/11/21 6' (1.829 m)    Weight:  Wt Readings from Last 1 Encounters:  09/26/21 123.2 kg    Ideal Body Weight:  80.9 kg  BMI:  Body mass index is 36.84 kg/m.  Estimated Nutritional Needs:  Kcal:  2200-2500 Protein:  130-150 grams Fluid:  >2 L/day    Ranell Patrick, RD, LDN Clinical Dietitian RD pager # available in [place]  After hours/weekend pager # available in [facility]

## 2021-09-26 NOTE — Progress Notes (Signed)
Physical Therapy Treatment Patient Details Name: Troy Randall MRN: [id number] DOB: 07-21-1960 Today's Date: 09/26/2021   History of Present Illness Patient is a 61 y/o male who presents on 09/04/21 with dark stools, dizziness and SOB. Found to have GI bleed and acute anemia s/p embolization 09/06/21 and percutaneous cholecystostomy 09/07/21. Intubated 9/3, CRRT 9/5-9/8. Hemorrhagic and septic shock 9/5. Trach placed 9/11. 9/16 MRI rapid development numerous round white matter lesions in both cerebral hemispheres; 9/17 MRI: numerous white matter lesions consistent with active demyelination - concerning for MS, ADEM, and cerebral vasculitis; 9/18 Lumbar puncture. PMH includes CVA, DM, HTN.    PT Comments    Pt with limited progress.  Wife does report he woke up early and may be fatigued.  Worked on sitting EOB with STEDY in front to hold; unable to attempt standing due to pt not able to assist.  Had hoped STEDY in front would encourage improved posture and upright would improve alertness and could progress to standing but unable.  Was able to maintain EOB 10 mins with min guard and holding STEDY.   Recommendations for follow up therapy are one component of a multi-disciplinary discharge planning process, led by the attending physician.  Recommendations may be updated based on patient status, additional functional criteria and insurance authorization.  Follow Up Recommendations  PT at Long-term acute care hospital     Assistance Recommended at Discharge Frequent or constant Supervision/Assistance  Patient can return home with the following Two people to help with walking and/or transfers;Two people to help with bathing/dressing/bathroom;Assistance with cooking/housework;Assist for transportation;Help with stairs or ramp for entrance   Equipment Recommendations  Other (comment) (TBD)    Recommendations for Other Services       Precautions / Restrictions Precautions Precautions:  Fall Precaution Comments: TC. ,mitts, cortrak     Mobility  Bed Mobility Overal bed mobility: Needs Assistance Bed Mobility: Rolling, Sidelying to Sit, Sit to Sidelying Rolling: Max assist, +2 for physical assistance Sidelying to sit: Max assist, +2 for physical assistance     Sit to sidelying: +2 for physical assistance, Max assist General bed mobility comments: Heavy max x 2 for all with cues, increased time, bed pad to assist, and assist to position legs flexed to assist with tx    Transfers                   General transfer comment: unable to attempt standing - too weak, not following commands; not safe to maximove to chair (tried yesterday with OT and pt sacral sits and slides)    Ambulation/Gait                   Stairs             Wheelchair Mobility    Modified Rankin (Stroke Patients Only)       Balance Overall balance assessment: Needs assistance Sitting-balance support: Feet supported, Bilateral upper extremity supported Sitting balance-Leahy Scale: Poor Sitting balance - Comments: Worked on EOB balance; Initially requiring mod A with posterior lean; Placed STEDY in front of pt and placed arms on bar -pt able to maintain with min guard.  Sat EOB 10 mins : tried to encourage tracking w eyes, posture but pt fatiguing.  Assisted with head control; washed pts back while sitting.  After ~10 mins pt fatiguing and leaning R       Standing balance comment: deferred  Cognition Arousal/Alertness: Awake/alert Behavior During Therapy: Flat affect Overall Cognitive Status: Impaired/Different from baseline                                 General Comments: Pt not able to verbalize during session.  Occasionally makes eye contact and tracks with max cues but not consistent.  Followed ~5% of commands with significant delay.        Exercises      General Comments        Pertinent  Vitals/Pain Pain Assessment Pain Assessment: No/denies pain    Home Living Family/patient expects to be discharged to:: Private residence Living Arrangements: Spouse/significant other                      Prior Function            PT Goals (current goals can now be found in the care plan section) Progress towards PT goals: Progressing toward goals    Frequency    Min 2X/week      PT Plan Current plan remains appropriate    Co-evaluation              AM-PAC PT "6 Clicks" Mobility   Outcome Measure  Help needed turning from your back to your side while in a flat bed without using bedrails?: Total Help needed moving from lying on your back to sitting on the side of a flat bed without using bedrails?: Total Help needed moving to and from a bed to a chair (including a wheelchair)?: Total Help needed standing up from a chair using your arms (e.g., wheelchair or bedside chair)?: Total Help needed to walk in hospital room?: Total Help needed climbing 3-5 steps with a railing? : Total 6 Click Score: 6    End of Session Equipment Utilized During Treatment: Oxygen Activity Tolerance: Patient limited by fatigue Patient left: in bed;with call bell/phone within reach;with family/visitor present;with bed alarm set Nurse Communication: Mobility status PT Visit Diagnosis: Muscle weakness (generalized) (M62.81);Other symptoms and signs involving the nervous system (R29.898)     Time: 1100-1130 PT Time Calculation (min) (ACUTE ONLY): 30 min  Charges:  $Therapeutic Activity: 8-22 mins $Neuromuscular Re-education: 8-22 mins                     Troy Randall, PT Acute Rehab [facility] [phone]    Karlton Lemon 09/26/2021, 1:31 PM

## 2021-09-26 NOTE — Progress Notes (Signed)
Inpatient Diabetes Program Recommendations  AACE/ADA: New Consensus Statement on Inpatient Glycemic Control   Target Ranges:  Prepandial:   less than 140 mg/dL      Peak postprandial:   less than 180 mg/dL (1-2 hours)      Critically ill patients:  140 - 180 mg/dL    Latest Reference Range & Units 09/26/21 00:06 09/26/21 04:27 09/26/21 08:01  Glucose-Capillary 70 - 99 mg/dL 317 (H) 269 (H) 292 (H)    Latest Reference Range & Units 09/25/21 04:30 09/25/21 07:49 09/25/21 12:30 09/25/21 16:09 09/25/21 21:24  Glucose-Capillary 70 - 99 mg/dL 198 (H) 252 (H) 188 (H) 236 (H) 261 (H)   Review of Glycemic Control  Current orders for Inpatient glycemic control: Semglee 35 units BID, Novolog 22 units Q4H, Novolog 0-20 units Q4H; Solumedrol 1000 mg daily, Vital @ 60 ml/hr  Inpatient Diabetes Program Recommendations:    Insulin: If steroids are continued as ordered, please consider increasing Semglee to 50 units BID. If Semglee is increased as recommended and glucose is still staying consistently over 180 mg/dl, would recommend to discontinue all SQ insulin and use IV insulin to help determine insulin needs.  Thanks, Barnie Alderman, RN, MSN, [place] Diabetes Coordinator Inpatient Diabetes Program [phone] (Team Pager from 8am to [place])

## 2021-09-27 DIAGNOSIS — R578 Other shock: Secondary | ICD-10-CM | POA: Diagnosis not present

## 2021-09-27 DIAGNOSIS — I10 Essential (primary) hypertension: Secondary | ICD-10-CM | POA: Diagnosis not present

## 2021-09-27 DIAGNOSIS — D62 Acute posthemorrhagic anemia: Secondary | ICD-10-CM | POA: Diagnosis not present

## 2021-09-27 DIAGNOSIS — N179 Acute kidney failure, unspecified: Secondary | ICD-10-CM | POA: Diagnosis not present

## 2021-09-27 DIAGNOSIS — G9340 Encephalopathy, unspecified: Secondary | ICD-10-CM | POA: Diagnosis not present

## 2021-09-27 DIAGNOSIS — K922 Gastrointestinal hemorrhage, unspecified: Secondary | ICD-10-CM | POA: Diagnosis not present

## 2021-09-27 DIAGNOSIS — Z8719 Personal history of other diseases of the digestive system: Secondary | ICD-10-CM | POA: Diagnosis not present

## 2021-09-27 DIAGNOSIS — E1165 Type 2 diabetes mellitus with hyperglycemia: Secondary | ICD-10-CM | POA: Diagnosis not present

## 2021-09-27 DIAGNOSIS — A498 Other bacterial infections of unspecified site: Secondary | ICD-10-CM | POA: Diagnosis not present

## 2021-09-27 DIAGNOSIS — J9611 Chronic respiratory failure with hypoxia: Secondary | ICD-10-CM | POA: Diagnosis not present

## 2021-09-27 DIAGNOSIS — A419 Sepsis, unspecified organism: Secondary | ICD-10-CM | POA: Diagnosis not present

## 2021-09-27 DIAGNOSIS — K810 Acute cholecystitis: Secondary | ICD-10-CM | POA: Diagnosis not present

## 2021-09-27 LAB — CBC WITH DIFFERENTIAL/PLATELET
Abs Immature Granulocytes: 0.11 10*3/uL — ABNORMAL HIGH (ref 0.00–0.07)
Basophils Absolute: 0 10*3/uL (ref 0.0–0.1)
Basophils Relative: 0 %
Eosinophils Absolute: 0 10*3/uL (ref 0.0–0.5)
Eosinophils Relative: 0 %
HCT: 38.7 % — ABNORMAL LOW (ref 39.0–52.0)
Hemoglobin: 12 g/dL — ABNORMAL LOW (ref 13.0–17.0)
Immature Granulocytes: 1 %
Lymphocytes Relative: 4 %
Lymphs Abs: 0.6 10*3/uL — ABNORMAL LOW (ref 0.7–4.0)
MCH: 28.5 pg (ref 26.0–34.0)
MCHC: 31 g/dL (ref 30.0–36.0)
MCV: 91.9 fL (ref 80.0–100.0)
Monocytes Absolute: 0.5 10*3/uL (ref 0.1–1.0)
Monocytes Relative: 3 %
Neutro Abs: 14.3 10*3/uL — ABNORMAL HIGH (ref 1.7–7.7)
Neutrophils Relative %: 92 %
Platelets: 252 10*3/uL (ref 150–400)
RBC: 4.21 MIL/uL — ABNORMAL LOW (ref 4.22–5.81)
RDW: 14.7 % (ref 11.5–15.5)
WBC: 15.5 10*3/uL — ABNORMAL HIGH (ref 4.0–10.5)
nRBC: 0 % (ref 0.0–0.2)

## 2021-09-27 LAB — COMPREHENSIVE METABOLIC PANEL
ALT: 40 U/L (ref 0–44)
AST: 22 U/L (ref 15–41)
Albumin: 2.4 g/dL — ABNORMAL LOW (ref 3.5–5.0)
Alkaline Phosphatase: 93 U/L (ref 38–126)
Anion gap: 6 (ref 5–15)
BUN: 37 mg/dL — ABNORMAL HIGH (ref 6–20)
CO2: 27 mmol/L (ref 22–32)
Calcium: 8.8 mg/dL — ABNORMAL LOW (ref 8.9–10.3)
Chloride: 120 mmol/L — ABNORMAL HIGH (ref 98–111)
Creatinine, Ser: 0.86 mg/dL (ref 0.61–1.24)
GFR, Estimated: >60 mL/min (ref >60)
Glucose, Bld: 218 mg/dL — ABNORMAL HIGH (ref 70–99)
Potassium: 3.8 mmol/L (ref 3.5–5.1)
Sodium: 153 mmol/L — ABNORMAL HIGH (ref 135–145)
Total Bilirubin: 0.4 mg/dL (ref 0.3–1.2)
Total Protein: 6.2 g/dL — ABNORMAL LOW (ref 6.5–8.1)

## 2021-09-27 LAB — MAGNESIUM: Magnesium: 2.6 mg/dL — ABNORMAL HIGH (ref 1.7–2.4)

## 2021-09-27 LAB — ANAEROBIC CULTURE W GRAM STAIN: Gram Stain: NONE SEEN

## 2021-09-27 LAB — GLUCOSE, CAPILLARY
Glucose-Capillary: 132 mg/dL — ABNORMAL HIGH (ref 70–99)
Glucose-Capillary: 139 mg/dL — ABNORMAL HIGH (ref 70–99)
Glucose-Capillary: 141 mg/dL — ABNORMAL HIGH (ref 70–99)
Glucose-Capillary: 168 mg/dL — ABNORMAL HIGH (ref 70–99)
Glucose-Capillary: 173 mg/dL — ABNORMAL HIGH (ref 70–99)
Glucose-Capillary: 225 mg/dL — ABNORMAL HIGH (ref 70–99)

## 2021-09-27 LAB — PHOSPHORUS: Phosphorus: 3.8 mg/dL (ref 2.5–4.6)

## 2021-09-27 MED ORDER — SODIUM CHLORIDE 0.45 % IV SOLN: INTRAVENOUS | Status: DC

## 2021-09-27 NOTE — Evaluation (Signed)
Clinical/Bedside Swallow Evaluation Patient Details  Name: AVIRAJ KENTNER MRN: [id number] Date of Birth: 02-06-1960  Today's Date: 09/27/2021 Time: SLP Start Time (ACUTE ONLY): 97 SLP Stop Time (ACUTE ONLY): 3151 SLP Time Calculation (min) (ACUTE ONLY): 20 min  Past Medical History:  Past Medical History:  Diagnosis Date   Anemia    Arthritis    CVA (cerebral vascular accident) ([place])    Diabetes mellitus without complication ([place])    Hyperlipidemia    Hypertension    Kidney stones    Past Surgical History:  Past Surgical History:  Procedure Laterality Date   ANKLE SURGERY     BIOPSY  09/10/2021   Procedure: BIOPSY;  Surgeon: Carol Ada, MD;  Location: [place];  Service: Gastroenterology;;   COLONOSCOPY WITH PROPOFOL N/A 09/06/2021   Procedure: COLONOSCOPY WITH PROPOFOL;  Surgeon: Harvel Quale, MD;  [facility];  Service: Gastroenterology;  Laterality: N/A;   ENTEROSCOPY N/A 09/10/2021   Procedure: ENTEROSCOPY;  Surgeon: Carol Ada, MD;  Location: [place];  Service: Gastroenterology;  Laterality: N/A;   ENTEROSCOPY  09/06/2021   Procedure: ENTEROSCOPY;  Surgeon: Harvel Quale, MD;  [facility];  Service: Gastroenterology;;   ESOPHAGOGASTRODUODENOSCOPY (EGD) WITH PROPOFOL  09/06/2021   Procedure: ESOPHAGOGASTRODUODENOSCOPY (EGD) WITH PROPOFOL;  Surgeon: Harvel Quale, MD;  [facility];  Service: Gastroenterology;;   [place] N/A 09/19/2021   Procedure: Beryle Quant;  Surgeon: Carol Ada, MD;  Location: [place];  Service: Gastroenterology;  Laterality: N/A;   FOREIGN BODY REMOVAL  09/19/2021   Procedure: FOREIGN BODY REMOVAL;  Surgeon: Carol Ada, MD;  Location: [place];  Service: Gastroenterology;;   Freda Munro CAPSULE STUDY  09/06/2021   Procedure: GIVENS CAPSULE STUDY;  Surgeon: Harvel Quale, MD;  [facility];  Service: Gastroenterology;;    HERNIA REPAIR     umbilical x1 Incisional x1   INCISIONAL HERNIA REPAIR  04/13/2011   Procedure: LAPAROSCOPIC INCISIONAL HERNIA;  Surgeon: Jamesetta So, MD;  Location: [facility];  Service: General;  Laterality: N/A;  Recurrent Laparoscopic Incisional Herniorraphy with Mesh   IR ANGIOGRAM SELECTIVE EACH ADDITIONAL VESSEL  09/09/2021   IR ANGIOGRAM VISCERAL SELECTIVE  09/07/2021   IR ANGIOGRAM VISCERAL SELECTIVE  09/06/2021   IR ANGIOGRAM VISCERAL SELECTIVE  09/06/2021   IR EMBO ART  VEN HEMORR LYMPH EXTRAV  INC GUIDE ROADMAPPING  09/06/2021   IR GUIDED DRAIN W CATHETER PLACEMENT  09/07/2021   IR US GUIDE BX ASP/DRAIN  09/07/2021   IR US GUIDE VASC ACCESS RIGHT  09/07/2021   IR US GUIDE VASC ACCESS RIGHT  09/06/2021   KIDNEY STONE SURGERY     HPI:  Pt is a 61 y.o. male with history of stroke in 4/23, HTN, HLD and DM with recent severe jejunal bleed s/p vessel coiling who is being seen for altered mental status and concerning white matter changes on MRI. Patient's wife reports that after his stroke in 4/23 he made a good recovery and was able to perform ADLs. He had some residual dysarthria. Pt presented to [facility] on 8/31 with GI bleeding and has remained persistently altered. Intubated 9/3 and trach placed 9/11. He currently has #6 cuffed trach.    Assessment / Plan / Recommendation  Clinical Impression  Patient presenting with clinical s/s of dysphagia as per this limited bedside/clinical swallow evaluation. He was awake and alert but not attempting any communication or interaction with SLP or with spouse. Per his son,  he did mouth "bye" to night shift RN last night. SLP performed oral care with toothbrush and yankauer suction, removing some thick wet secretions from back of oral cavity. SLP did observe some dry secretions that were adhered to patient's hard palate, but when SLP attempting to remove, patient's RR was noted to increase to as high as 40 and patient exhibiting physical signs of SOB/increased  WOB. SLP ceased oral care and patient's RR returned to baseline 25-28. No PO's attempted secondary to high risk of aspiration. SLP provided family with handouts depicting PMV and trach and informed them of plan for SLP to return next date for PO and/or PMV trials. SLP recommending continue strict NPO status. SLP Visit Diagnosis: Dysphagia, unspecified (R13.10)    Aspiration Risk  Severe aspiration risk;Risk for inadequate nutrition/hydration    Diet Recommendation NPO   Medication Administration: Via alternative means    Other  Recommendations Oral Care Recommendations: Oral care QID;Staff/trained caregiver to provide oral care    Recommendations for follow up therapy are one component of a multi-disciplinary discharge planning process, led by the attending physician.  Recommendations may be updated based on patient status, additional functional criteria and insurance authorization.  Follow up Recommendations SLP at Long-term acute care hospital      Assistance Recommended at Discharge Frequent or constant Supervision/Assistance  Functional Status Assessment Patient has had a recent decline in their functional status and demonstrates the ability to make significant improvements in function in a reasonable and predictable amount of time.  Frequency and Duration min 3x week  2 weeks       Prognosis Prognosis for Safe Diet Advancement: Fair Barriers to Reach Goals: Severity of deficits      Swallow Study   General Date of Onset: 09/04/21 HPI: Pt is a 61 y.o. male with history of stroke in 4/23, HTN, HLD and DM with recent severe jejunal bleed s/p vessel coiling who is being seen for altered mental status and concerning white matter changes on MRI. Patient's wife reports that after his stroke in 4/23 he made a good recovery and was able to perform ADLs. He had some residual dysarthria. Pt presented to [facility] on 8/31 with GI bleeding and has remained persistently altered.  Intubated 9/3 and trach placed 9/11. He currently has #6 cuffed trach. Type of Study: Bedside Swallow Evaluation Previous Swallow Assessment: none found Diet Prior to this Study: NPO Temperature Spikes Noted: No Respiratory Status: Trach;Trach Collar Trach Size and Type: Deflated;Cuff;#6;With PMSV not in place History of Recent Intubation: Yes Length of Intubations (days):  (intubated 09/07/21, trach placed 9/11) Behavior/Cognition: Alert;Cooperative;Confused;Requires cueing Oral Cavity Assessment: Dry;Dried secretions;Excessive secretions Oral Care Completed by SLP: Yes Oral Cavity - Dentition: Adequate natural dentition Baseline Vocal Quality: Not observed Volitional Cough: Cognitively unable to elicit Volitional Swallow: Unable to elicit    Oral/Motor/Sensory Function Overall Oral Motor/Sensory Function: Other (comment) (patient would open mouth slightly for oral care but did not exhibit any purposeful oral motor movements)   Ice Chips Ice chips: Not tested   Thin Liquid Thin Liquid: Not tested    Nectar Thick Nectar Thick Liquid: Not tested   Honey Thick Honey Thick Liquid: Not tested   Puree Puree: Not tested   Solid     Solid: Not tested      Angela Nevin, MA, CCC-SLP Speech Therapy

## 2021-09-27 NOTE — Plan of Care (Signed)
  Problem: Education: Goal: Knowledge of General Education information will improve Description: Including pain rating scale, medication(s)/side effects and non-pharmacologic comfort measures 09/27/2021 1621 by Ricardo Jericho, RN Outcome: Progressing 09/27/2021 1620 by Ricardo Jericho, RN Outcome: Progressing   Problem: Health Behavior/Discharge Planning: Goal: Ability to manage health-related needs will improve Outcome: Progressing   Problem: Clinical Measurements: Goal: Ability to maintain clinical measurements within normal limits will improve 09/27/2021 1621 by Ricardo Jericho, RN Outcome: Progressing 09/27/2021 1620 by Ricardo Jericho, RN Outcome: Progressing   Problem: Activity: Goal: Risk for activity intolerance will decrease 09/27/2021 1621 by Ricardo Jericho, RN Outcome: Progressing 09/27/2021 1620 by Ricardo Jericho, RN Outcome: Progressing   Problem: Nutrition: Goal: Adequate nutrition will be maintained 09/27/2021 1621 by Ricardo Jericho, RN Outcome: Progressing 09/27/2021 1620 by Ricardo Jericho, RN Outcome: Progressing   Problem: Coping: Goal: Level of anxiety will decrease Outcome: Progressing

## 2021-09-27 NOTE — Progress Notes (Signed)
Troy Randall KGM:[id number] DOB: May 04, 1960 DOA: 09/04/2021 PCP: Benita Stabile, MD   Subj: 61 year old WM PMHx HTN, HLD, DM type II uncontrolled with Hyperglycemia, CVA, s/p Hernia Repair with mesh 2011 at [facility].  Admitted initially for GI bleed status post IR embolization followed by cholecystitis status post percutaneous cholecystostomy tube placement with ongoing altered mental status.  MRI of the brain with contrast showed numerous contrast-enhancing bilateral white matter lesions concerning for debilitating disease.  Nephrology consulted, underwent LP 9/18 with low suspicious for infectious etiology.  Neurology recommending high-dose IV steroids.  IV steroid started 9/19. Transfer to [facility] 9/20.   Obj: 9/23 afebrile overnight A/O x0.  Will occasionally track his wife around room.  Occasionally will turn his head when you shower his name Troy Randall.   Objective: VITAL SIGNS: Temp: 98.7 F (37.1 C) (09/23 6440) Temp Source: Oral (09/23 3474) BP: 120/80 (09/23 2595) Pulse Rate: 115 (09/23 0741) SPO2; 97% FIO2: 20% Flow 5 L/min Trach collar: Placed 9/11, Shiley 6 mm cuffed   Intake/Output Summary (Last 24 hours) at 09/27/2021 0853 Last data filed at 09/27/2021 0630 Gross per 24 hour  Intake 5 ml  Output 2345 ml  Net -2340 ml      Exam: General: A/O x0 No acute respiratory distress Lungs: Clear to auscultation bilaterally without wheezes or crackles Cardiovascular: Regular rate and rhythm without murmur gallop or rub normal S1 and S2 Abdomen: Nontender, nondistended, soft, bowel sounds positive, no rebound, no ascites, no appreciable mass, cholecystostomy tube in place with brownish discharge. Extremities: No significant cyanosis, clubbing, or edema bilateral lower extremities Skin: Negative rashes, lesions, ulcers Psychiatric: Unable to evaluate secondary to altered mental status Central nervous system: Spontaneously moves all extremities, unable to evaluate secondary to  altered mental status  .   SCD Mobility Assessment (last 72 hours)     Mobility Assessment     Row Name 09/26/21 2002 09/26/21 1329 09/26/21 0143 09/25/21 2028 09/25/21 1200   Does patient have an order for bedrest or is patient medically unstable No - Continue assessment -- No - Continue assessment No - Continue assessment --   What is the highest level of mobility based on the progressive mobility assessment? Level 1 (Bedfast) - Unable to balance while sitting on edge of bed Level 1 (Bedfast) - Unable to balance while sitting on edge of bed Level 1 (Bedfast) - Unable to balance while sitting on edge of bed Level 1 (Bedfast) - Unable to balance while sitting on edge of bed Level 1 (Bedfast) - Unable to balance while sitting on edge of bed   Is the above level different from baseline mobility prior to current illness? Yes - Recommend PT order -- Yes - Recommend PT order Yes - Recommend PT order --    Row Name 09/25/21 0830 09/24/21 2300         Does patient have an order for bedrest or is patient medically unstable No - Continue assessment No - Continue assessment      What is the highest level of mobility based on the progressive mobility assessment? Level 1 (Bedfast) - Unable to balance while sitting on edge of bed Level 1 (Bedfast) - Unable to balance while sitting on edge of bed      Is the above level different from baseline mobility prior to current illness? Yes - Recommend PT order Yes - Recommend PT order                 DVT  prophylaxis: SCD Code Status: Full Family Communication: 9/232 wife at bedside for discussion of plan of care all questions answered Status is: Inpatient    Dispo: The patient is from: Home              Anticipated d/c is to: Home              Anticipated d/c date is: > 3 days              Patient currently is not medically stable to d/c.    Procedures/Significant Events: 8/31 presented to [facility], [name] Admit, Gi Consult 9/1 CTA> no evidence of  active GI bleed, 2 U PRBC 9/2 2 U PRBC, colonoscopy > no active bleed found but large clots throughout entire colon. EGD> small hiatal hernia, erosive gastropathy with no stigmata of recent bleeding, and non-bleeding duodenal diverticulum.   Underwent then small bowel enteroscopy which noted intermittent blood pumping in the proximal jejunum but was unable to be reached despite multiple attempts, was injected and clip placed. Repeat CTA repeated, which showed positive jejunal diverticular bleeding. IR> active extrav at [facility] territory, jejunal arcade branch, successful coil embo 9/3 Intubated. CTA> no evidence of active GI bleeding.  New finding of abnormal gallbladder with nondependent air, gallbladder wall air, pericholecystic and right upper quadrant information.  IR placed percutaneous cholecystotomy tube.  9/4 R. IJ central line placed with dialysis triple lumen catheter 9/5 CRRT initiated 9/6 Bedside EGD without any evidence of bleeding. CTA a/p without etiology for bleed 9/8 CRRT discontinued. Required ETT exchange yesterday due to blown cuff. 9/11 Bronchoscopic guided percutaneous tracheostomy placement.  Right IJ central line removed. 9/15 flexible sigmoidoscopy with removal of endoscopy 9/16 MRI brain w/o contrast amended study secondary to range of motion.  Rapid development numerous round white matter lesions in both cerebral hemispheres.  Prior MRIs. 9/17MRI brain: Numerous contrast-enhancing bilateral white matter lesions, consistent with active demyelination. 9/18 Lumbar puncture  9/19 MRI cervical spine, thoracic : Limited exam, within limitations there is not definitive evidence of active demyelinization. Within limitations of motion artifact, there may be demyelinating lesions at the C6 and T1 vertebral body levels. -S/p  LP on 9/18. -CSF fluid: All negative, cell count with differential negative CSF fluid NGTD CSF glucose= 126 elevated CSF oligoclonal bands negative CSF VDRL  negative CSF albumin 11 low CSF IgG/albumin ratio 0.32 high CSF negative fungus preliminary CSF anaerobic culture negative CSF HSV 1/2 negative CSF cryptococcal antigen negative  9/18 ANA negative 9/18 c-ANCA/p-ANCA/anti-PR-3/anti-MPO negative  9/18 C3 = 184 elevated 9/18 C4 = 35 WNL  HSV PCR pending, anaerobic culture: Pending, fungus culture no isolated, I GG CSF index pending VDRL CSF pending, oligoclonal bands pending cultures: No growth today.    Consultants:  Neurology PCCM IR   Cultures   Antimicrobials: Anti-infectives (From admission, onward)    Start     Ordered Stop   09/11/21 1100  cefTRIAXone (ROCEPHIN) 2 g in sodium chloride 0.9 % 100 mL IVPB        09/11/21 1010 09/13/21 1108   09/08/21 0200  piperacillin-tazobactam (ZOSYN) IVPB 3.375 g  Status:  Discontinued        09/07/21 1856 09/07/21 2114   09/07/21 2200  meropenem (MERREM) 1 g in sodium chloride 0.9 % 100 mL IVPB  Status:  Discontinued        09/07/21 2114 09/11/21 1010   09/07/21 1945  piperacillin-tazobactam (ZOSYN) IVPB 3.375 g        09/07/21  1856 09/07/21 2005   09/07/21 1753  cefTRIAXone (ROCEPHIN) injection        09/07/21 1754 09/07/21 1753   09/07/21 1749  sodium chloride 0.9 % with cefTRIAXone (ROCEPHIN) [facility] Med       Note to Pharmacy: Eveline Keto E: cabinet override   09/07/21 1749 09/07/21 1935        A/P  -Acute Metabolic Encephalopathy/Hypoxic ischemic Encephalopathy/ Hx CVA wo residual deficits -Patient was not following commands, continues to be lethargic.   -MRI brain and spine consistent with MS  vs Acute Disseminated Encephalomyelitis (ADEM) -Patient was a started on high-dose IV steroids to treat for MS, day 5/5 -9/23 neurology will need to order repeat MRI: IV steroids complete.  -Family members state some purposeful movement. -9/23 CSF labs returned mostly all negative.  A couple positive labs unsure of significance in regards to diagnosis of MS vs ADEM.  Contact  neurology in a.m. and inform them of results.   Acute hypoxic respiratory failure status post cutaneous tracheostomy 9/11: -9/11 s/p Tracheostomy  -Continue with trach care   Hemorrhagic shock/ABLA/ secondary to lower GI bleed, jejunal diverticular and ulcer secondary to ischemia Acute blood loss and iron deficiency anemia secondary to above. -9/2 s/p status post 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  -bedside endoscopy  -9/15 Endoscopic capsule removal  -He received multiple blood transfusion during this hospitalization. -9/19 baby ASA  -Protonix 40 mg BID due to high-dose steroids   E Coli Cholecystitis  -9/3 s/p percutaneous cholecystostomy drain placement: -Completed 7 days ceftriaxone -Follow-up outpatient surgery and IR  AKI -9/8 off CRRT Lab Results  Component Value Date   CREATININE 0.86 09/27/2021   CREATININE 0.87 09/26/2021   CREATININE 0.82 09/25/2021   CREATININE 0.85 09/24/2021   CREATININE 0.83 09/23/2021  -Resolved -Nephrology signed off  Hypernatremia:   -9/22 increase free water 449ml q6hr -9/23 0.45% saline 39ml/hr Lab Results  Component Value Date   NA 153 (H) 09/27/2021   NA 150 (H) 09/26/2021   NA 150 (H) 09/25/2021   NA 148 (H) 09/24/2021   NA 149 (H) 09/23/2021    DM type II uncontrolled with Hyperglycemia -4/25 hemoglobin A1c= 6.2 CBG (last 3)  Recent Labs    09/26/21 2004 09/27/21 0033 09/27/21 0439  GLUCAP 166* 225* 173*   -Semglee 35 units BID -9/22 increase NovoLog 25 units q 4 hr - Resistant SSI -Reduce insulin dose after completion of IV steroids  Essential HTN - Amlodipine 10 mg daily -   Pulmonary nodules in bilateral bases Never smoker.  Obesity (BMI 36.84 kg/m.)   Nutrition;  -Patiently being fed via NG tube. - NG tube placed on 9/12 - Technically NG tube can be used for 4-6 weeks, however another risk factor for additional infection -Patient s/p hernia repair with mesh 2011 at [facility], will consult  surgery given patient's complicated multiple diagnosis.  Is placement of a PEG tube possible?          Care during the described time interval was provided by me .  I have reviewed this patient's available data, including medical history, events of note, physical examination, and all test results as part of my evaluation.

## 2021-09-28 DIAGNOSIS — N179 Acute kidney failure, unspecified: Secondary | ICD-10-CM | POA: Diagnosis not present

## 2021-09-28 DIAGNOSIS — D500 Iron deficiency anemia secondary to blood loss (chronic): Secondary | ICD-10-CM

## 2021-09-28 DIAGNOSIS — G9340 Encephalopathy, unspecified: Secondary | ICD-10-CM | POA: Diagnosis not present

## 2021-09-28 DIAGNOSIS — I10 Essential (primary) hypertension: Secondary | ICD-10-CM | POA: Diagnosis not present

## 2021-09-28 DIAGNOSIS — E785 Hyperlipidemia, unspecified: Secondary | ICD-10-CM | POA: Diagnosis not present

## 2021-09-28 DIAGNOSIS — A498 Other bacterial infections of unspecified site: Secondary | ICD-10-CM | POA: Diagnosis not present

## 2021-09-28 DIAGNOSIS — G9341 Metabolic encephalopathy: Secondary | ICD-10-CM | POA: Diagnosis not present

## 2021-09-28 DIAGNOSIS — K810 Acute cholecystitis: Secondary | ICD-10-CM | POA: Diagnosis not present

## 2021-09-28 DIAGNOSIS — A419 Sepsis, unspecified organism: Secondary | ICD-10-CM | POA: Diagnosis not present

## 2021-09-28 DIAGNOSIS — J9611 Chronic respiratory failure with hypoxia: Secondary | ICD-10-CM | POA: Diagnosis not present

## 2021-09-28 DIAGNOSIS — Z8719 Personal history of other diseases of the digestive system: Secondary | ICD-10-CM | POA: Diagnosis not present

## 2021-09-28 DIAGNOSIS — R578 Other shock: Secondary | ICD-10-CM | POA: Diagnosis not present

## 2021-09-28 DIAGNOSIS — K922 Gastrointestinal hemorrhage, unspecified: Secondary | ICD-10-CM | POA: Diagnosis not present

## 2021-09-28 DIAGNOSIS — D62 Acute posthemorrhagic anemia: Secondary | ICD-10-CM | POA: Diagnosis not present

## 2021-09-28 DIAGNOSIS — Z930 Tracheostomy status: Secondary | ICD-10-CM

## 2021-09-28 LAB — GLUCOSE, CAPILLARY
Glucose-Capillary: 127 mg/dL — ABNORMAL HIGH (ref 70–99)
Glucose-Capillary: 253 mg/dL — ABNORMAL HIGH (ref 70–99)
Glucose-Capillary: 276 mg/dL — ABNORMAL HIGH (ref 70–99)
Glucose-Capillary: 217 mg/dL — ABNORMAL HIGH (ref 70–99)
Glucose-Capillary: 230 mg/dL — ABNORMAL HIGH (ref 70–99)

## 2021-09-28 LAB — CBC WITH DIFFERENTIAL/PLATELET
Abs Immature Granulocytes: 0.16 10*3/uL — ABNORMAL HIGH (ref 0.00–0.07)
Basophils Absolute: 0 10*3/uL (ref 0.0–0.1)
Basophils Relative: 0 %
Eosinophils Absolute: 0 10*3/uL (ref 0.0–0.5)
Eosinophils Relative: 0 %
HCT: 38.9 % — ABNORMAL LOW (ref 39.0–52.0)
Hemoglobin: 11.9 g/dL — ABNORMAL LOW (ref 13.0–17.0)
Immature Granulocytes: 1 %
Lymphocytes Relative: 4 %
Lymphs Abs: 0.6 10*3/uL — ABNORMAL LOW (ref 0.7–4.0)
MCH: 28.5 pg (ref 26.0–34.0)
MCHC: 30.6 g/dL (ref 30.0–36.0)
MCV: 93.1 fL (ref 80.0–100.0)
Monocytes Absolute: 0.5 10*3/uL (ref 0.1–1.0)
Monocytes Relative: 4 %
Neutro Abs: 13.4 10*3/uL — ABNORMAL HIGH (ref 1.7–7.7)
Neutrophils Relative %: 91 %
Platelets: 238 10*3/uL (ref 150–400)
RBC: 4.18 MIL/uL — ABNORMAL LOW (ref 4.22–5.81)
RDW: 14.6 % (ref 11.5–15.5)
WBC: 14.7 10*3/uL — ABNORMAL HIGH (ref 4.0–10.5)
nRBC: 0 % (ref 0.0–0.2)

## 2021-09-28 LAB — COMPREHENSIVE METABOLIC PANEL
ALT: 37 U/L (ref 0–44)
AST: 25 U/L (ref 15–41)
Albumin: 2.4 g/dL — ABNORMAL LOW (ref 3.5–5.0)
Alkaline Phosphatase: 89 U/L (ref 38–126)
Anion gap: 8 (ref 5–15)
BUN: 35 mg/dL — ABNORMAL HIGH (ref 6–20)
CO2: 29 mmol/L (ref 22–32)
Calcium: 8.6 mg/dL — ABNORMAL LOW (ref 8.9–10.3)
Chloride: 114 mmol/L — ABNORMAL HIGH (ref 98–111)
Creatinine, Ser: 0.78 mg/dL (ref 0.61–1.24)
GFR, Estimated: >60 mL/min (ref >60)
Glucose, Bld: 181 mg/dL — ABNORMAL HIGH (ref 70–99)
Potassium: 4.1 mmol/L (ref 3.5–5.1)
Sodium: 151 mmol/L — ABNORMAL HIGH (ref 135–145)
Total Bilirubin: 0.5 mg/dL (ref 0.3–1.2)
Total Protein: 5.8 g/dL — ABNORMAL LOW (ref 6.5–8.1)

## 2021-09-28 LAB — PHOSPHORUS: Phosphorus: 4.3 mg/dL (ref 2.5–4.6)

## 2021-09-28 LAB — MAGNESIUM: Magnesium: 2.8 mg/dL — ABNORMAL HIGH (ref 1.7–2.4)

## 2021-09-28 MED ORDER — INSULIN ASPART 100 UNIT/ML IJ SOLN: 0.0000 [IU] | Freq: Three times a day (TID) | INTRAMUSCULAR | Status: DC

## 2021-09-28 MED ORDER — INSULIN ASPART 100 UNIT/ML IJ SOLN
0.0000 [IU] | INTRAMUSCULAR | Status: DC
  Administered 2021-09-28 (×2): 8 [IU] via SUBCUTANEOUS
  Administered 2021-09-28 – 2021-09-29 (×4): 5 [IU] via SUBCUTANEOUS
  Administered 2021-09-29: 3 [IU] via SUBCUTANEOUS
  Administered 2021-09-29 (×2): 5 [IU] via SUBCUTANEOUS
  Administered 2021-09-30 (×4): 3 [IU] via SUBCUTANEOUS
  Administered 2021-09-30: 2 [IU] via SUBCUTANEOUS
  Administered 2021-09-30: 3 [IU] via SUBCUTANEOUS
  Administered 2021-10-01: 2 [IU] via SUBCUTANEOUS
  Administered 2021-10-01: 3 [IU] via SUBCUTANEOUS
  Administered 2021-10-01: 5 [IU] via SUBCUTANEOUS
  Administered 2021-10-01: 2 [IU] via SUBCUTANEOUS
  Administered 2021-10-01 (×2): 3 [IU] via SUBCUTANEOUS
  Administered 2021-10-02 (×2): 5 [IU] via SUBCUTANEOUS
  Administered 2021-10-02: 3 [IU] via SUBCUTANEOUS
  Administered 2021-10-02: 5 [IU] via SUBCUTANEOUS
  Administered 2021-10-03: 3 [IU] via SUBCUTANEOUS
  Administered 2021-10-04: 2 [IU] via SUBCUTANEOUS
  Administered 2021-10-04 (×2): 3 [IU] via SUBCUTANEOUS
  Administered 2021-10-04: 2 [IU] via SUBCUTANEOUS
  Administered 2021-10-04: 3 [IU] via SUBCUTANEOUS
  Administered 2021-10-04: 5 [IU] via SUBCUTANEOUS
  Administered 2021-10-05 (×2): 3 [IU] via SUBCUTANEOUS
  Administered 2021-10-05: 8 [IU] via SUBCUTANEOUS
  Administered 2021-10-05 – 2021-10-06 (×3): 3 [IU] via SUBCUTANEOUS
  Administered 2021-10-06: 8 [IU] via SUBCUTANEOUS
  Administered 2021-10-06: 5 [IU] via SUBCUTANEOUS
  Administered 2021-10-06: 8 [IU] via SUBCUTANEOUS
  Administered 2021-10-06: 5 [IU] via SUBCUTANEOUS
  Administered 2021-10-06 – 2021-10-07 (×2): 8 [IU] via SUBCUTANEOUS
  Administered 2021-10-07 (×2): 5 [IU] via SUBCUTANEOUS
  Administered 2021-10-07: 3 [IU] via SUBCUTANEOUS
  Administered 2021-10-07 – 2021-10-08 (×3): 8 [IU] via SUBCUTANEOUS
  Administered 2021-10-08: 5 [IU] via SUBCUTANEOUS
  Administered 2021-10-08: 8 [IU] via SUBCUTANEOUS
  Administered 2021-10-08: 11 [IU] via SUBCUTANEOUS
  Administered 2021-10-08: 8 [IU] via SUBCUTANEOUS
  Administered 2021-10-09: 3 [IU] via SUBCUTANEOUS
  Administered 2021-10-09: 5 [IU] via SUBCUTANEOUS
  Administered 2021-10-09 (×3): 8 [IU] via SUBCUTANEOUS
  Administered 2021-10-09 – 2021-10-10 (×2): 5 [IU] via SUBCUTANEOUS
  Administered 2021-10-10 (×2): 8 [IU] via SUBCUTANEOUS
  Administered 2021-10-10: 5 [IU] via SUBCUTANEOUS
  Administered 2021-10-10: 3 [IU] via SUBCUTANEOUS
  Administered 2021-10-11: 5 [IU] via SUBCUTANEOUS
  Administered 2021-10-11 (×2): 3 [IU] via SUBCUTANEOUS
  Administered 2021-10-11 – 2021-10-12 (×6): 5 [IU] via SUBCUTANEOUS
  Administered 2021-10-12: 3 [IU] via SUBCUTANEOUS
  Administered 2021-10-12: 5 [IU] via SUBCUTANEOUS
  Administered 2021-10-12: 3 [IU] via SUBCUTANEOUS
  Administered 2021-10-13: 2 [IU] via SUBCUTANEOUS
  Administered 2021-10-13: 3 [IU] via SUBCUTANEOUS
  Administered 2021-10-13: 2 [IU] via SUBCUTANEOUS
  Administered 2021-10-13 – 2021-10-14 (×3): 3 [IU] via SUBCUTANEOUS
  Administered 2021-10-14 (×2): 2 [IU] via SUBCUTANEOUS
  Administered 2021-10-14 – 2021-10-15 (×4): 3 [IU] via SUBCUTANEOUS
  Administered 2021-10-15 – 2021-10-16 (×3): 2 [IU] via SUBCUTANEOUS
  Administered 2021-10-16: 3 [IU] via SUBCUTANEOUS
  Administered 2021-10-16: 2 [IU] via SUBCUTANEOUS
  Administered 2021-10-16: 5 [IU] via SUBCUTANEOUS
  Administered 2021-10-17 (×5): 2 [IU] via SUBCUTANEOUS
  Administered 2021-10-17 – 2021-10-18 (×2): 3 [IU] via SUBCUTANEOUS
  Administered 2021-10-18 (×2): 5 [IU] via SUBCUTANEOUS
  Administered 2021-10-18 (×2): 3 [IU] via SUBCUTANEOUS
  Administered 2021-10-20: 5 [IU] via SUBCUTANEOUS
  Administered 2021-10-21: 3 [IU] via SUBCUTANEOUS
  Administered 2021-10-21 (×2): 5 [IU] via SUBCUTANEOUS
  Administered 2021-10-21: 3 [IU] via SUBCUTANEOUS
  Administered 2021-10-21: 8 [IU] via SUBCUTANEOUS
  Administered 2021-10-21 – 2021-10-22 (×2): 5 [IU] via SUBCUTANEOUS
  Administered 2021-10-22 (×2): 3 [IU] via SUBCUTANEOUS
  Administered 2021-10-22 (×2): 5 [IU] via SUBCUTANEOUS
  Administered 2021-10-23 – 2021-10-24 (×3): 2 [IU] via SUBCUTANEOUS
  Administered 2021-10-24: 3 [IU] via SUBCUTANEOUS
  Administered 2021-10-25: 5 [IU] via SUBCUTANEOUS
  Administered 2021-10-25: 3 [IU] via SUBCUTANEOUS
  Administered 2021-10-25: 2 [IU] via SUBCUTANEOUS
  Administered 2021-10-25: 5 [IU] via SUBCUTANEOUS
  Administered 2021-10-26 (×2): 3 [IU] via SUBCUTANEOUS
  Administered 2021-10-26: 2 [IU] via SUBCUTANEOUS
  Administered 2021-10-26: 5 [IU] via SUBCUTANEOUS
  Administered 2021-10-26: 3 [IU] via SUBCUTANEOUS
  Administered 2021-10-27: 2 [IU] via SUBCUTANEOUS

## 2021-09-28 MED ORDER — DEXTROSE 5 % IV SOLN: INTRAVENOUS | Status: DC

## 2021-09-28 MED ORDER — INSULIN ASPART 100 UNIT/ML IJ SOLN
0.0000 [IU] | Freq: Every day | INTRAMUSCULAR | Status: DC
  Administered 2021-09-28 – 2021-09-29 (×2): 2 [IU] via SUBCUTANEOUS
  Administered 2021-10-05 – 2021-10-07 (×2): 3 [IU] via SUBCUTANEOUS

## 2021-09-28 MED ORDER — LORAZEPAM 2 MG/ML IJ SOLN
1.0000 mg | Freq: Once | INTRAMUSCULAR | Status: AC
  Administered 2021-09-29: 1 mg via INTRAVENOUS
  Filled 2021-09-28: qty 1

## 2021-09-28 NOTE — Progress Notes (Addendum)
Subjective: Slight improvement per family. More attentive to external stimuli. More purposeful movements.   Objective: Current vital signs: BP (!) 143/70   Pulse (!) 111   Temp 98 F (36.7 C) (Axillary)   Resp 19   Ht 6' (1.829 m)   Wt 123.5 kg   SpO2 94%   BMI 36.93 kg/m  Vital signs in last 24 hours: Temp:  [97.6 F (36.4 C)-98 F (36.7 C)] 98 F (36.7 C) (09/23 2100) Pulse Rate:  [60-120] 111 (09/24 0809) Resp:  [12-31] 19 (09/24 0809) BP: (132-154)/(70-100) 143/70 (09/24 0252) SpO2:  [94 %-98 %] 94 % (09/24 0809) FiO2 (%):  [28 %] 28 % (09/24 0809)  Intake/Output from previous day: 09/23 0701 - 09/24 0700 In: 6573.3 [I.V.:262.3; NG/GT:6311] Out: 750 [Urine:600; Drains:150] Intake/Output this shift: No intake/output data recorded. Nutritional status:  Diet Order             Diet NPO time specified  Diet effective now                  HEENT: [place]/AT. Trach collar is noted.  Ext: No edema   Neurologic Exam: Ment: Awake with eyes half-open, left more so than right. Did gaze at examiner's face and did make eye contact.  Still with no attempts to speak. Able to move all extremities semipurposefully as he engages in fidgeting/restless movements in the bed.    CN: Fixates briefly. Eyes are conjugate. Will track over a limited range of EOM. Face is symmetric, with improved tone to mouth/jaw since last exam.   Motor/Sensory: No change from prior exam regarding spontaneous movements. Did not follow commands for strength testing.  Cerebellar/Gait: Unable to assess  Lab Results: Results for orders placed or performed during the hospital encounter of 09/04/21 (from the past 48 hour(s))  Glucose, capillary     Status: Abnormal   Collection Time: 09/26/21 12:00 PM  Result Value Ref Range   Glucose-Capillary 217 (H) 70 - 99 mg/dL    Comment: Glucose reference range applies only to samples taken after fasting for at least 8 hours.  Comprehensive metabolic panel     Status:  Abnormal   Collection Time: 09/26/21 12:15 PM  Result Value Ref Range   Sodium 150 (H) 135 - 145 mmol/L   Potassium 4.0 3.5 - 5.1 mmol/L   Chloride 116 (H) 98 - 111 mmol/L   CO2 24 22 - 32 mmol/L   Glucose, Bld 242 (H) 70 - 99 mg/dL    Comment: Glucose reference range applies only to samples taken after fasting for at least 8 hours.   BUN 36 (H) 6 - 20 mg/dL   Creatinine, Ser 3.71 0.61 - 1.24 mg/dL   Calcium 8.8 (L) 8.9 - 10.3 mg/dL   Total Protein 6.4 (L) 6.5 - 8.1 g/dL   Albumin 2.5 (L) 3.5 - 5.0 g/dL   AST 29 15 - 41 U/L   ALT 40 0 - 44 U/L   Alkaline Phosphatase 100 38 - 126 U/L   Total Bilirubin 0.6 0.3 - 1.2 mg/dL   GFR, Estimated >06 >26 mL/min    Comment: (NOTE) Calculated using the CKD-EPI Creatinine Equation (2021)    Anion gap 10 5 - 15    Comment: Performed at [facility], [address], [place], [place] [address]  Magnesium     Status: Abnormal   Collection Time: 09/26/21 12:15 PM  Result Value Ref Range   Magnesium 2.5 (H) 1.7 - 2.4 mg/dL  Comment: Performed at [facility], [address] [place] [place], [place] [address]  Phosphorus     Status: None   Collection Time: 09/26/21 12:15 PM  Result Value Ref Range   Phosphorus 3.7 2.5 - 4.6 mg/dL    Comment: Performed at [facility], [address] [place], [place] [address]  Glucose, capillary     Status: Abnormal   Collection Time: 09/26/21  4:11 PM  Result Value Ref Range   Glucose-Capillary 190 (H) 70 - 99 mg/dL    Comment: Glucose reference range applies only to samples taken after fasting for at least 8 hours.  Glucose, capillary     Status: Abnormal   Collection Time: 09/26/21  8:04 PM  Result Value Ref Range   Glucose-Capillary 166 (H) 70 - 99 mg/dL    Comment: Glucose reference range applies only to samples taken after fasting for at least 8 hours.  Glucose, capillary     Status: Abnormal   Collection Time: 09/27/21 12:33 AM  Result Value Ref Range   Glucose-Capillary 225 (H) 70 -  99 mg/dL    Comment: Glucose reference range applies only to samples taken after fasting for at least 8 hours.  Comprehensive metabolic panel     Status: Abnormal   Collection Time: 09/27/21  3:00 AM  Result Value Ref Range   Sodium 153 (H) 135 - 145 mmol/L   Potassium 3.8 3.5 - 5.1 mmol/L   Chloride 120 (H) 98 - 111 mmol/L   CO2 27 22 - 32 mmol/L   Glucose, Bld 218 (H) 70 - 99 mg/dL    Comment: Glucose reference range applies only to samples taken after fasting for at least 8 hours.   BUN 37 (H) 6 - 20 mg/dL   Creatinine, Ser 0.98 0.61 - 1.24 mg/dL   Calcium 8.8 (L) 8.9 - 10.3 mg/dL   Total Protein 6.2 (L) 6.5 - 8.1 g/dL   Albumin 2.4 (L) 3.5 - 5.0 g/dL   AST 22 15 - 41 U/L   ALT 40 0 - 44 U/L   Alkaline Phosphatase 93 38 - 126 U/L   Total Bilirubin 0.4 0.3 - 1.2 mg/dL   GFR, Estimated >11 >91 mL/min    Comment: (NOTE) Calculated using the CKD-EPI Creatinine Equation (2021)    Anion gap 6 5 - 15    Comment: Electrolytes repeated to confirm. Performed at [facility], [address], [place], [place] [address]   Magnesium     Status: Abnormal   Collection Time: 09/27/21  3:00 AM  Result Value Ref Range   Magnesium 2.6 (H) 1.7 - 2.4 mg/dL    Comment: Performed at [facility], [address] [place], [place] [address]  Phosphorus     Status: None   Collection Time: 09/27/21  3:00 AM  Result Value Ref Range   Phosphorus 3.8 2.5 - 4.6 mg/dL    Comment: Performed at [facility], [address] [place], [place] [address]  CBC with Differential/Platelet     Status: Abnormal   Collection Time: 09/27/21  3:00 AM  Result Value Ref Range   WBC 15.5 (H) 4.0 - 10.5 K/uL   RBC 4.21 (L) 4.22 - 5.81 MIL/uL   Hemoglobin 12.0 (L) 13.0 - 17.0 g/dL   HCT 84.6 (L) 96.2 - 95.2 %   MCV 91.9 80.0 - 100.0 fL   MCH 28.5 26.0 - 34.0 pg   MCHC 31.0 30.0 - 36.0  g/dL   RDW 70.4 88.8 - 91.6 %   Platelets 252 150 - 400 K/uL   nRBC 0.0 0.0 - 0.2 %   Neutrophils Relative % 92 %    Neutro Abs 14.3 (H) 1.7 - 7.7 K/uL   Lymphocytes Relative 4 %   Lymphs Abs 0.6 (L) 0.7 - 4.0 K/uL   Monocytes Relative 3 %   Monocytes Absolute 0.5 0.1 - 1.0 K/uL   Eosinophils Relative 0 %   Eosinophils Absolute 0.0 0.0 - 0.5 K/uL   Basophils Relative 0 %   Basophils Absolute 0.0 0.0 - 0.1 K/uL   Immature Granulocytes 1 %   Abs Immature Granulocytes 0.11 (H) 0.00 - 0.07 K/uL    Comment: Performed at [facility], [address], [place], [place] [address]  Glucose, capillary     Status: Abnormal   Collection Time: 09/27/21  4:39 AM  Result Value Ref Range   Glucose-Capillary 173 (H) 70 - 99 mg/dL    Comment: Glucose reference range applies only to samples taken after fasting for at least 8 hours.  Glucose, capillary     Status: Abnormal   Collection Time: 09/27/21  9:02 AM  Result Value Ref Range   Glucose-Capillary 141 (H) 70 - 99 mg/dL    Comment: Glucose reference range applies only to samples taken after fasting for at least 8 hours.  Glucose, capillary     Status: Abnormal   Collection Time: 09/27/21 11:59 AM  Result Value Ref Range   Glucose-Capillary 132 (H) 70 - 99 mg/dL    Comment: Glucose reference range applies only to samples taken after fasting for at least 8 hours.  Glucose, capillary     Status: Abnormal   Collection Time: 09/27/21  5:39 PM  Result Value Ref Range   Glucose-Capillary 139 (H) 70 - 99 mg/dL    Comment: Glucose reference range applies only to samples taken after fasting for at least 8 hours.  Glucose, capillary     Status: Abnormal   Collection Time: 09/27/21  8:41 PM  Result Value Ref Range   Glucose-Capillary 168 (H) 70 - 99 mg/dL    Comment: Glucose reference range applies only to samples taken after fasting for at least 8 hours.  Glucose, capillary     Status: Abnormal   Collection Time: 09/28/21  2:56 AM  Result Value Ref Range   Glucose-Capillary 127 (H) 70 - 99 mg/dL    Comment: Glucose reference range applies only to samples  taken after fasting for at least 8 hours.  Comprehensive metabolic panel     Status: Abnormal   Collection Time: 09/28/21  4:40 AM  Result Value Ref Range   Sodium 151 (H) 135 - 145 mmol/L   Potassium 4.1 3.5 - 5.1 mmol/L   Chloride 114 (H) 98 - 111 mmol/L   CO2 29 22 - 32 mmol/L   Glucose, Bld 181 (H) 70 - 99 mg/dL    Comment: Glucose reference range applies only to samples taken after fasting for at least 8 hours.   BUN 35 (H) 6 - 20 mg/dL   Creatinine, Ser 8.88 0.61 - 1.24 mg/dL   Calcium 8.6 (L) 8.9 - 10.3 mg/dL   Total Protein 5.8 (L) 6.5 - 8.1 g/dL   Albumin 2.4 (L) 3.5 - 5.0 g/dL   AST 25 15 - 41 U/L   ALT 37 0 - 44 U/L   Alkaline Phosphatase 89 38 - 126 U/L   Total Bilirubin 0.5  0.3 - 1.2 mg/dL   GFR, Estimated >02 >58 mL/min    Comment: (NOTE) Calculated using the CKD-EPI Creatinine Equation (2021)    Anion gap 8 5 - 15    Comment: Performed at [facility], [address], [place], [place] [address]  Magnesium     Status: Abnormal   Collection Time: 09/28/21  4:40 AM  Result Value Ref Range   Magnesium 2.8 (H) 1.7 - 2.4 mg/dL    Comment: Performed at [facility], [address] [place], [place] [address]  Phosphorus     Status: None   Collection Time: 09/28/21  4:40 AM  Result Value Ref Range   Phosphorus 4.3 2.5 - 4.6 mg/dL    Comment: Performed at [facility], [address] [place], [place] [address]  CBC with Differential/Platelet     Status: Abnormal   Collection Time: 09/28/21  4:40 AM  Result Value Ref Range   WBC 14.7 (H) 4.0 - 10.5 K/uL   RBC 4.18 (L) 4.22 - 5.81 MIL/uL   Hemoglobin 11.9 (L) 13.0 - 17.0 g/dL   HCT 31.5 (L) 40.0 - 86.7 %   MCV 93.1 80.0 - 100.0 fL   MCH 28.5 26.0 - 34.0 pg   MCHC 30.6 30.0 - 36.0 g/dL   RDW 61.9 50.9 - 32.6 %   Platelets 238 150 - 400 K/uL   nRBC 0.0 0.0 - 0.2 %   Neutrophils Relative % 91 %   Neutro Abs 13.4 (H) 1.7 - 7.7 K/uL   Lymphocytes Relative 4 %   Lymphs Abs 0.6 (L) 0.7 - 4.0 K/uL    Monocytes Relative 4 %   Monocytes Absolute 0.5 0.1 - 1.0 K/uL   Eosinophils Relative 0 %   Eosinophils Absolute 0.0 0.0 - 0.5 K/uL   Basophils Relative 0 %   Basophils Absolute 0.0 0.0 - 0.1 K/uL   Immature Granulocytes 1 %   Abs Immature Granulocytes 0.16 (H) 0.00 - 0.07 K/uL    Comment: Performed at [facility], [address], [place], [place] [address]    Recent Results (from the past 240 hour(s))  CSF culture w Gram Stain     Status: None   Collection Time: 09/22/21  1:48 PM   Specimen: CSF; Cerebrospinal Fluid  Result Value Ref Range Status   Specimen Description CSF  Final   Special Requests NONE  Final   Gram Stain   Final    WBC PRESENT, PREDOMINANTLY MONONUCLEAR NO ORGANISMS SEEN CYTOSPIN SMEAR    Culture   Final    NO GROWTH 3 DAYS Performed at [facility], [address] [place], [place] [address]    Report Status 09/25/2021 FINAL  Final  Culture, fungus without smear     Status: None (Preliminary result)   Collection Time: 09/22/21  1:49 PM   Specimen: CSF; Cerebrospinal Fluid  Result Value Ref Range Status   Specimen Description CSF  Final   Special Requests NONE  Final   Culture   Final    NO FUNGUS ISOLATED AFTER 5 DAYS Performed at [facility], [address] [place], [place] [address]    Report Status PENDING  Incomplete  Anaerobic culture w Gram Stain     Status: None   Collection Time: 09/22/21  1:49 PM   Specimen: CSF; Cerebrospinal Fluid  Result Value Ref Range Status   Specimen Description CSF  Final   Special Requests NONE  Final   Gram Stain NO WBC SEEN NO ORGANISMS SEEN   Final   Culture   Final    NO ANAEROBES ISOLATED Performed at [facility], [address] [place], [place] [address]    Report Status 09/27/2021 FINAL  Final    Lipid Panel No results for input(s): "CHOL", "TRIG", "HDL", "CHOLHDL", "VLDL", "LDLCALC" in the last 72 hours.  Studies/Results: No results found.  Medications:  Scheduled:  amLODipine  10 mg Per Tube Daily   aspirin  81 mg Per Tube Daily   Chlorhexidine Gluconate Cloth  6 each Topical Daily   docusate  100 mg Per Tube Daily   feeding supplement (PROSource TF20)  60 mL Per Tube BID   free water  400 mL Per Tube Q6H   heparin  5,000 Units Subcutaneous Q8H   insulin aspart  0-20 Units Subcutaneous Q4H   insulin aspart  25 Units Subcutaneous Q4H   insulin glargine-yfgn  35 Units Subcutaneous BID   mouth rinse  15 mL Mouth Rinse Q2H   pantoprazole  40 mg Per Tube BID   polyethylene glycol  17 g Per Tube Daily   rosuvastatin  40 mg Per Tube Daily   sodium chloride flush  10-40 mL Intracatheter Q12H   sodium chloride flush  5 mL Intracatheter Q8H   Zinc Oxide   Topical BID   Continuous:  sodium chloride 75 mL/hr at 09/27/21 1028   sodium chloride Stopped (09/15/21 1519)   sodium chloride     dextrose     feeding supplement (VITAL 1.5 CAL) 1,000 mL (09/26/21 1617)   lactated ringers      Assessment: 61 year old patient with history of stroke in April of this year with minimal residual deficits, HTN, HLD, DM and recent jejunal bleed, presented with continued altered mental status and concerning multifocal enhancing white matter lesions on MRI that exhibit morphologies and a distribution suggestive of MS or ADEM, with vasculitis also a consideration, but lower on the differential diagnosis. He has no prior history of demyelinating disease and wife states no prior episodes of focal weakness or vision loss. Also with no family history of MS. Spine imaging negative for cord lesions. Has completed a 5 day course of IV Solumedrol as empiric treatment for the lesions.  - Serial exam findings: - Exam today: Improved facial tone and eye contact relative to prior exam, but otherwise unchanged.  - On exam Wednesday no significant improvement was noted relative to Tuesday's assessment, but was overall slightly improved relative to 2 days prior on Monday. There had  been slight improvement after 2 days of IV steroid treatment, but it was unclear if this was due to the steroids.  - On exam Tuesday, the patient was able to intermittently follow simple commands, but with significantly increased latencies of motor responses. Also able to move all extremities semipurposefully as he engages in fidgeting/restless movements in the bed. He did wave "bye" with his right hand when examiner was preparing to leave the room.   - Imaging: - White matter changes on MRI with multiple foci of bland restricted diffusion may represent strokes, although pattern would be unusual. The locations and morphologies of the lesions could be consistent with demyelinating disease, but the enhancing pattern would be atypical. Infectious etiology unlikely as there is no perilesional edema or necrotic core associated with the lesions. - MRI brain follow up post-contrast imaging reveals >20 contrast enhancing lesions in the white matter bilaterally. Strongly favor  inflammation / active demyelination possibly MS vs ADEM on initial interpretation. Based on personal review of the images by they seem more complatible with ADEM or a vasculitic process than with MS. Sequela of septic shock is suspected, but I could not find any literature with specific brain imaging to support or refute this component of the DDx.  - MRI of cervical and thoracic spine w/wo contrast: Markedly limited exam for assessment of the spinal cord due to the degree of motion artifact. Within this limitation, there is no definite evidence of active demyelination. Within limitations of motion artifact, there may be demyelinating lesions at the C6 and T1 vertebral body levels. Repeat exam is recommended if more definitive characterization as clinically warranted. - Inflammation favored over infection given that he has been afebrile and primarily altered but not ill-appearing therefore no indication for empiric CNS coverage at this time.  -  LP was performed by CCM on 9/18. CSF findings: - Glucose 126, RBC count 1-5 (unremarkable), clear, colorless, total protein normal, WBC normal.  - Gram stain: Negative - Fungal and bacterial cultures pending - Infectious disease Bio[employer] panel negative for all pathogens tested, including cryptococcus, VZV and HSV.  - Cryptococcal Ag negative.  - IgG index is normal at 0.5.  - OCBs testing has resulted: No oligoclonal bands seen - Serum labs:  - Vitamin B12 level consistent with supplementation. Mildly elevated Na. BUN 30. ALT 48. TSH normal. ANA negative.  - C3 complement level elevated at 184. Autoimmune panel otherwise negative.  - HIV screen negative.  - DDx for AMS: May be secondary to a CNS inflammatory process as evidenced by the enhancing lesions seen on brain MRI. Inflammation may not be restricted only to the regions of enhancement visible on MRI. Sequelae of end organ damage from his septic shock is also on the DDx (see footnote below). 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  - DDx for white matter lesions on MRI: ADEM, multiple sclerosis and cerebral vasculitis (isolated or as a component of a possible systemic vasculitis) are felt to be highest on the DDx.  - Assessment of response to 5 day course of pulsed-dose steroids (completed on Saturday): Mild improvement gradually over the 5 day period, but uncertain if this is due to the steroid treatment.       Recommendations: - OOB to chair when possible to reduce the risk of developing an ICU neuropathy or myopathy - Repeat MRI brain w/wo contrast has been ordered for today (Sunday) to assess for possible resolution of the enhancing lesions after steroid treatment. He has a vascular coil and surgical clips overlying the left hemiabdomen on recent abdominal x-ray and MRI techs should be reminded of this  prior to scanning   Footnote:  - Per literature review on brain lesions in septic shock patients (see citation below): "Patients with sepsis may have focal lesions that cause direct damage to the brain. Sharshar et al. reported various focal lesions in 23 patients who died in the ICU from septic shock: ischemia (100%), hemorrhages (26%), hypercoagulability syndrome (9%), micro-abscesses (9%), and multifocal necrotizing leukoencephalopathy (9%) [89]. Severe sepsis can induce life-threatening coagulopathy, including disseminated intravascular coagulation (DIC) and intracerebral hemorrhage. Focal brain lesions should be considered in patients with sepsis who show signs of cognitive impairment."  -Diagram of proposed mechanisms of sepsis-associated brain injury and cognitive impairment (from Nobofumi, et al. Sepsis-associated brain injury:  underlying mechanisms and potential therapeutic strategies for acute and long-term cognitive impairments. Journal of Neuroinflammation 19; 2022)     LOS: 24 days   @Electronically  signed: Dr. Kerney Elbe 09/28/2021  8:24 AM

## 2021-09-28 NOTE — Progress Notes (Addendum)
PROGRESS NOTE    RAGHAV VERRILLI  EUM:[id number] DOB: July 08, 1960 DOA: 09/04/2021 PCP: Benita Stabile, MD    Brief Narrative:   Patient is a 61 years old male with medical history of hypertension, hyperlipidemia, type 2 diabetes with hyperglycemia, CVA, presented to the hospital with GI bleed.  Patient underwent IR embolization followed by percutaneous cholecystostomy tube placement due to ongoing altered mental status.  MRI of the brain showed numerous contrast-enhancing bilateral white matter lesions concerning for myelinating disease.  Neurology was consulted and underwent lumbar puncture on 09/22/2021 with low suspicion for infectious etiology.  Neurology has initiated the patient on high-dose steroids on 09/23/2021.  Patient was then transferred to hospitalist service on 09/24/2021.    Significant events. 8/31 presented to [facility] ED, [name] Admit, Gi Consult 9/1 CTA> no evidence of active GI bleed, 2 U PRBC 9/2 2 U PRBC, colonoscopy > no active bleed found but large clots throughout entire colon. EGD> small hiatal hernia, erosive gastropathy with no stigmata of recent bleeding, and non-bleeding duodenal diverticulum.   Underwent then small bowel enteroscopy which noted intermittent blood pumping in the proximal jejunum but was unable to be reached despite multiple attempts, was injected and clip placed. Repeat CTA repeated, which showed positive jejunal diverticular bleeding. IR> active extrav at [facility], jejunal arcade branch, successful coil embo 9/3 Intubated. CTA> no evidence of active GI bleeding.  New finding of abnormal gallbladder with nondependent air, gallbladder wall air, pericholecystic and right upper quadrant information.  IR placed percutaneous cholecystotomy tube.  9/4 R. IJ central line placed with dialysis triple lumen catheter 9/5 CRRT initiated 9/6 Bedside EGD without any evidence of bleeding. CTA a/p without etiology for bleed 9/8 CRRT discontinued. Required ETT exchange  yesterday due to blown cuff. 9/11 Bronchoscopic guided percutaneous tracheostomy placement.  Right IJ central line removed. 9/15 flexible sigmoidoscopy with removal of endoscopy 9/16 MRI brain w/o contrast amended study secondary to range of motion.  Rapid development numerous round white matter lesions in both cerebral hemispheres.  Prior MRIs. 9/17 MRI brain: Numerous contrast-enhancing bilateral white matter lesions, consistent with active demyelination. 9/18 Lumbar puncture  9/19 MRI cervical spine, thoracic : Limited exam, within limitations there is not definitive evidence of active demyelinization. Within limitations of motion artifact, there may be demyelinating lesions at the C6 and T1 vertebral body levels. -S/p  LP on 9/18.    Assessment and Plan: Active Problems:   Uncontrolled type 2 diabetes mellitus with hyperglycemia, without long-term current use of insulin (HCC)   Essential hypertension   Hyperlipidemia   Chronic respiratory failure with hypoxia (HCC)   Tracheostomy status (HCC)   Acute encephalopathy   Anemia due to blood loss   History of lower GI bleeding: 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    H/O ischemic bowel disease   Acute emphysematous cholecystitis   Acute Metabolic Encephalopathy/Hypoxic ischemic Encephalopathy/ Hx CVA wo residual deficits MRI brain scan consistent with multiple sclerosis versus acute disseminated encephalomyelitis.  Was started on high-dose IV steroids and has completed 5-day course.  Neurology following.  CSF and negative for infection.   Acute hypoxic respiratory failure status post cutaneous tracheostomy 9/11: On 9/11 s/p Tracheostomy continue trach care.   Hemorrhagic shock/ABLA/ secondary to lower GI bleed, jejunal diverticular and ulcer secondary to ischemia Acute blood loss and iron deficiency anemia  Patient underwent coil embolization of the distal branch of SMA on 09/06/2021.  Also had endoscopic capsule done.   Had multiple  blood transfusion during hospitalization.  Has been started on low-dose aspirin 09/23/2021.  Protonix twice daily   Acute emphysematous E. coli cholecystitis   s/p percutaneous cholecystostomy drain placement on 09/07/2021. Completed 7-day course of ceftriaxone.  Follow-up with surgery and IR as outpatient.   AKI -9/8 off CRRT has improved.  Nephrology has signed off.  Continue to monitor renal function.   Hypernatremia:   Free water increased to 400 mL Q6 on 09/26/2021.  Was started on half-normal saline from 09/27/2021.  Will change to D5 water.  At this time sodium level of 151   DM type II uncontrolled with Hyperglycemia  hemoglobin A1c= 6.2.  On Semglee 35 units twice daily.  Currently on resistant sliding scale insulin.  We will decrease sliding scale insulin to moderate scale since off steroids.   Essential HTN Continue amlodipine   Pulmonary nodules in bilateral bases Never smoker.   Obesity (BMI 36.84 kg/m.) Present on admission.  Currently on NG tube feeding.  Status post hernia repair with mesh 2011.  Question PEG tube on discharge.    DVT prophylaxis: Place and maintain sequential compression device Start: 09/26/21 1759 heparin injection 5,000 Units Start: 09/22/21 1600 Place and maintain sequential compression device Start: 09/04/21 1914   Code Status:     Code Status: Full Code  Disposition: Uncertain at this time  Status is: Inpatient  Remains inpatient appropriate because: Multiple issues, tube feeding, pending clinical improvement, hypernatremia, status post tracheostomy, neurology follow-up   Family Communication: Spoke with the patient's family at bedside.  Consultants:  General surgery Neurology Critical care  Procedures:  PRBC transfusion CRRT NG tube placement Coil embolization EGD/colonoscopy/small bowel enteroscopy Intubation and mechanical ventilation Tracheostomy placement Percutaneous cholecystostomy tube  placement  Antimicrobials:  None currently  Subjective: Today, patient was seen and examined at bedside.  As per the family he is more alert in has started to cry some intermittently.  Objective: Vitals:   09/27/21 2347 09/28/21 0252 09/28/21 0322 09/28/21 0809  BP: 132/70 (!) 143/70    Pulse: 61  (!) 120 (!) 111  Resp: 19 12 16 19   Temp:      TempSrc:      SpO2: 97% 98% 98% 94%  Weight:      Height:        Intake/Output Summary (Last 24 hours) at 09/28/2021 0828 Last data filed at 09/28/2021 0648 Gross per 24 hour  Intake 6573.33 ml  Output 750 ml  Net 5823.33 ml   Filed Weights   09/23/21 0500 09/26/21 0500 09/27/21 0500  Weight: 120.3 kg 123.2 kg 123.5 kg    Physical Examination: Body mass index is 36.93 kg/m.   General: Obese built, not in obvious distress HENT:   No scleral pallor or icterus noted. Oral mucosa is moist.  Cortrak tube tube in place Chest:  Clear breath sounds.  Diminished breath sounds bilaterally. No crackles or wheezes.  CVS: S1 &S2 heard. No murmur.  Regular rate and rhythm. Abdomen: Soft, nontender, nondistended.  Bowel sounds are heard.  Percutaneous cholecystostomy tube in place.  External Foley catheter. Extremities: No cyanosis, clubbing or edema.  Peripheral pulses are palpable.  Mittens in place.  Right upper extremity PICC line in place Psych: Nonverbal, mildly tearful CNS: Moves extremities Skin: Warm and dry.    Data Reviewed:   CBC: Recent Labs  Lab 09/22/21 0938 09/24/21 0830 09/25/21 1028 09/26/21 0335 09/27/21 0300 09/28/21 0440  WBC 8.4 15.1* 16.6* 13.6* 15.5* 14.7*  NEUTROABS 6.6 14.3*  --  12.4* 14.3* 13.4*  HGB 10.1* 10.9* 11.7* 11.8* 12.0* 11.9*  HCT 32.3* 34.8* 37.6* 38.7* 38.7* 38.9*  MCV 91.2 90.6 91.9 93.0 91.9 93.1  PLT 264 278 305 268 252 458    Basic Metabolic Panel: Recent Labs  Lab 09/23/21 0300 09/24/21 0343 09/25/21 1028 09/26/21 1215 09/27/21 0300 09/28/21 0440  NA 149* 148* 150* 150* 153*  151*  K 3.9 4.2 3.8 4.0 3.8 4.1  CL 114* 114* 116* 116* 120* 114*  CO2 29 25 28 24 27 29   GLUCOSE 214* 348* 271* 242* 218* 181*  BUN 30* 30* 35* 36* 37* 35*  CREATININE 0.83 0.85 0.82 0.87 0.86 0.78  CALCIUM 8.7* 8.9 8.9 8.8* 8.8* 8.6*  MG 2.3  --  2.4 2.5* 2.6* 2.8*  PHOS 3.8 3.5  --  3.7 3.8 4.3    Liver Function Tests: Recent Labs  Lab 09/22/21 1348 09/24/21 0343 09/26/21 1215 09/27/21 0300 09/28/21 0440  AST  --   --  29 22 25   ALT  --   --  40 40 37  ALKPHOS  --   --  100 93 89  BILITOT  --   --  0.6 0.4 0.5  PROT  --   --  6.4* 6.2* 5.8*  ALBUMIN 1.9* 2.3* 2.5* 2.4* 2.4*     Radiology Studies: No results found.    LOS: 24 days    Flora Lipps, MD [facility] Available via Epic secure chat 7am-7pm After these hours, please refer to coverage provider listed on [email or web address] 09/28/2021, 8:28 AM

## 2021-09-29 ENCOUNTER — Inpatient Hospital Stay (HOSPITAL_COMMUNITY): Payer: Medicare HMO

## 2021-09-29 DIAGNOSIS — R578 Other shock: Secondary | ICD-10-CM | POA: Diagnosis not present

## 2021-09-29 DIAGNOSIS — Z930 Tracheostomy status: Secondary | ICD-10-CM | POA: Diagnosis not present

## 2021-09-29 DIAGNOSIS — D62 Acute posthemorrhagic anemia: Secondary | ICD-10-CM | POA: Diagnosis not present

## 2021-09-29 DIAGNOSIS — N179 Acute kidney failure, unspecified: Secondary | ICD-10-CM | POA: Diagnosis not present

## 2021-09-29 DIAGNOSIS — K922 Gastrointestinal hemorrhage, unspecified: Secondary | ICD-10-CM | POA: Diagnosis not present

## 2021-09-29 DIAGNOSIS — D500 Iron deficiency anemia secondary to blood loss (chronic): Secondary | ICD-10-CM | POA: Diagnosis not present

## 2021-09-29 DIAGNOSIS — Z8719 Personal history of other diseases of the digestive system: Secondary | ICD-10-CM | POA: Diagnosis not present

## 2021-09-29 DIAGNOSIS — K810 Acute cholecystitis: Secondary | ICD-10-CM | POA: Diagnosis not present

## 2021-09-29 DIAGNOSIS — I10 Essential (primary) hypertension: Secondary | ICD-10-CM | POA: Diagnosis not present

## 2021-09-29 DIAGNOSIS — G9340 Encephalopathy, unspecified: Secondary | ICD-10-CM | POA: Diagnosis not present

## 2021-09-29 DIAGNOSIS — E785 Hyperlipidemia, unspecified: Secondary | ICD-10-CM | POA: Diagnosis not present

## 2021-09-29 DIAGNOSIS — A498 Other bacterial infections of unspecified site: Secondary | ICD-10-CM | POA: Diagnosis not present

## 2021-09-29 DIAGNOSIS — J9611 Chronic respiratory failure with hypoxia: Secondary | ICD-10-CM | POA: Diagnosis not present

## 2021-09-29 LAB — CBC WITH DIFFERENTIAL/PLATELET
Abs Immature Granulocytes: 0.22 10*3/uL — ABNORMAL HIGH (ref 0.00–0.07)
Basophils Absolute: 0 10*3/uL (ref 0.0–0.1)
Basophils Relative: 0 %
Eosinophils Absolute: 0.1 10*3/uL (ref 0.0–0.5)
Eosinophils Relative: 0 %
HCT: 36.6 % — ABNORMAL LOW (ref 39.0–52.0)
Hemoglobin: 11.5 g/dL — ABNORMAL LOW (ref 13.0–17.0)
Immature Granulocytes: 2 %
Lymphocytes Relative: 10 %
Lymphs Abs: 1.4 10*3/uL (ref 0.7–4.0)
MCH: 28.3 pg (ref 26.0–34.0)
MCHC: 31.4 g/dL (ref 30.0–36.0)
MCV: 90.1 fL (ref 80.0–100.0)
Monocytes Absolute: 0.9 10*3/uL (ref 0.1–1.0)
Monocytes Relative: 6 %
Neutro Abs: 11.2 10*3/uL — ABNORMAL HIGH (ref 1.7–7.7)
Neutrophils Relative %: 82 %
Platelets: 205 10*3/uL (ref 150–400)
RBC: 4.06 MIL/uL — ABNORMAL LOW (ref 4.22–5.81)
RDW: 14.4 % (ref 11.5–15.5)
WBC: 13.8 10*3/uL — ABNORMAL HIGH (ref 4.0–10.5)
nRBC: 0 % (ref 0.0–0.2)

## 2021-09-29 LAB — GLUCOSE, CAPILLARY
Glucose-Capillary: 238 mg/dL — ABNORMAL HIGH (ref 70–99)
Glucose-Capillary: 201 mg/dL — ABNORMAL HIGH (ref 70–99)
Glucose-Capillary: 201 mg/dL — ABNORMAL HIGH (ref 70–99)
Glucose-Capillary: 193 mg/dL — ABNORMAL HIGH (ref 70–99)
Glucose-Capillary: 226 mg/dL — ABNORMAL HIGH (ref 70–99)
Glucose-Capillary: 218 mg/dL — ABNORMAL HIGH (ref 70–99)
Glucose-Capillary: 264 mg/dL — ABNORMAL HIGH (ref 70–99)

## 2021-09-29 LAB — COMPREHENSIVE METABOLIC PANEL
ALT: 39 U/L (ref 0–44)
AST: 27 U/L (ref 15–41)
Albumin: 2.2 g/dL — ABNORMAL LOW (ref 3.5–5.0)
Alkaline Phosphatase: 87 U/L (ref 38–126)
Anion gap: 8 (ref 5–15)
BUN: 30 mg/dL — ABNORMAL HIGH (ref 6–20)
CO2: 29 mmol/L (ref 22–32)
Calcium: 8.5 mg/dL — ABNORMAL LOW (ref 8.9–10.3)
Chloride: 107 mmol/L (ref 98–111)
Creatinine, Ser: 0.72 mg/dL (ref 0.61–1.24)
GFR, Estimated: >60 mL/min (ref >60)
Glucose, Bld: 192 mg/dL — ABNORMAL HIGH (ref 70–99)
Potassium: 3.8 mmol/L (ref 3.5–5.1)
Sodium: 144 mmol/L (ref 135–145)
Total Bilirubin: 0.5 mg/dL (ref 0.3–1.2)
Total Protein: 5.4 g/dL — ABNORMAL LOW (ref 6.5–8.1)

## 2021-09-29 LAB — PHOSPHORUS: Phosphorus: 3.6 mg/dL (ref 2.5–4.6)

## 2021-09-29 LAB — CYTOLOGY - NON PAP

## 2021-09-29 LAB — MAGNESIUM: Magnesium: 2.6 mg/dL — ABNORMAL HIGH (ref 1.7–2.4)

## 2021-09-29 MED ORDER — INFLUENZA VAC SPLIT QUAD 0.5 ML IM SUSY: 0.5000 mL | PREFILLED_SYRINGE | INTRAMUSCULAR | Status: DC

## 2021-09-29 MED ORDER — TRANEXAMIC ACID FOR INHALATION
500.0000 mg | Freq: Three times a day (TID) | RESPIRATORY_TRACT | Status: DC
  Administered 2021-09-29 – 2021-10-01 (×5): 500 mg via RESPIRATORY_TRACT
  Filled 2021-09-29 (×6): qty 10

## 2021-09-29 MED ORDER — PNEUMOCOCCAL VAC POLYVALENT 25 MCG/0.5ML IJ INJ
0.5000 mL | INJECTION | INTRAMUSCULAR | Status: DC
  Filled 2021-09-29: qty 0.5

## 2021-09-29 NOTE — Inpatient Diabetes Management (Addendum)
Inpatient Diabetes Program Recommendations  AACE/ADA: New Consensus Statement on Inpatient Glycemic Control (2015)  Target Ranges:  Prepandial:   less than 140 mg/dL      Peak postprandial:   less than 180 mg/dL (1-2 hours)      Critically ill patients:  140 - 180 mg/dL    Latest Reference Range & Units 09/28/21 02:56 09/28/21 09:32 09/28/21 12:43 09/28/21 17:29 09/28/21 21:10  Glucose-Capillary 70 - 99 mg/dL 127 (H) 253 (H)  8 units Novolog @1034   35 units Semglee  276 (H)  8 units Novolog@1432  217 (H)  5 units Novolog  230 (H)  7 units Novolog   35 units Semglee   (H): Data is abnormally high  Latest Reference Range & Units 09/29/21 01:12 09/29/21 05:17  Glucose-Capillary 70 - 99 mg/dL 238 (H)  5 units Novolog  201 (H)  5 units Novolog  35 units Semglee @0835   (H): Data is abnormally high    Home DM Meds: Glipizide 10 mg daily        Metformin 500 mg AM/ 1000 mg PM        Tresiba 20 units BID   Current Orders: Novolog Moderate Correction Scale/ SSI (0-15 units) Q4 hours + Bedtime      Semglee 35 units BID    Last dose Solumedrol given AM 9/23    MD- Note pt Getting Tube Feeds 60cc/hr.  Please consider:  1. Stop the HS Novolog SSI Coverage since pt getting Novolog SSI Q4 hours now  2. Start Novolog Tube Feed Coverage: Novolog 3 units Q4 hours to start HOLD if tube feeds HELD for any reason    --Will follow patient during hospitalization--  Wyn Quaker RN, MSN, [place] [facility] Coordinator Inpatient Glycemic Control Team Team Pager: [phone] (8a-5p)

## 2021-09-29 NOTE — Progress Notes (Signed)
   09/29/21 0749  Assess: MEWS Score  Temp 98.2 F (36.8 C)  BP (!) 153/96  Pulse Rate (!) 126  Resp (!) 21  SpO2 97 %  O2 Device Tracheostomy Collar  O2 Flow Rate (L/min) 5 L/min  FiO2 (%) 28 %  Assess: MEWS Score  MEWS Temp 0  MEWS Systolic 0  MEWS Pulse 2  MEWS RR 1  MEWS LOC 0  MEWS Score 3  MEWS Score Color Yellow  Assess: if the MEWS score is Yellow or Red  Were vital signs taken at a resting state? Yes  Focused Assessment No change from prior assessment  Does the patient meet 2 or more of the SIRS criteria? No  Does the patient have a confirmed or suspected source of infection? No  Treat  MEWS Interventions Administered scheduled meds/treatments  Pain Scale Faces  Pain Score 0  Faces Pain Scale 0  Take Vital Signs  Increase Vital Sign Frequency  Yellow: Q 2hr X 2 then Q 4hr X 2, if remains yellow, continue Q 4hrs  Document  Patient Outcome Transferred/level of care increased  Assess: SIRS CRITERIA  SIRS Temperature  0  SIRS Pulse 1  SIRS Respirations  1  SIRS WBC 1  SIRS Score Sum  3

## 2021-09-29 NOTE — Progress Notes (Signed)
Neurology Progress Note  Subjective: No improvement per family.  Patient remains altered from baseline.   Objective: Current vital signs: BP (!) 153/96   Pulse (!) 126   Temp 98.2 F (36.8 C) (Axillary)   Resp (!) 21   Ht 6' (1.829 m)   Wt 123.5 kg   SpO2 97%   BMI 36.93 kg/m  Vital signs in last 24 hours: Temp:  [98.2 F (36.8 C)-98.5 F (36.9 C)] 98.2 F (36.8 C) (09/25 0014) Pulse Rate:  [49-126] 126 (09/25 0749) Resp:  [13-21] 21 (09/25 0749) BP: (108-153)/(48-96) 153/96 (09/25 0749) SpO2:  [96 %-100 %] 97 % (09/25 0749) FiO2 (%):  [28 %] 28 % (09/25 0749)  Intake/Output from previous day: 09/24 0701 - 09/25 0700 In: 3235.5 [I.V.:1533.5; NG/GT:1692] Out: 1800 [Urine:1650; Drains:150] Intake/Output this shift: No intake/output data recorded. Nutritional status:  Diet Order             Diet NPO time specified  Diet effective now                  HEENT: [place]/AT. Trach collar is noted with bloody secretions noted. Ext: No edema   Neurologic Exam: Ment: Awake with eyes half-open, left more so than right.  Will intermittently make eye contact.  Still with no attempts to speak. Able to move all extremities purposefully but does not move them to command.  Does not follow commands. CN: PERRL. Fixates briefly. Eyes are conjugate. Will intermittently track over a limited range of EOM. Face is symmetric.  Motor/Sensory: Spontaneous movement of the left upper extremity greater than the right upper extremity.  Lateral lower extremities move spontaneously without gravity.  Withdraws to noxious stimuli briskly throughout. Cerebellar/Gait: Unable to assess  Lab Results: Results for orders placed or performed during the hospital encounter of 09/04/21 (from the past 48 hour(s))  Glucose, capillary     Status: Abnormal   Collection Time: 09/27/21 11:59 AM  Result Value Ref Range   Glucose-Capillary 132 (H) 70 - 99 mg/dL    Comment: Glucose reference range applies only to  samples taken after fasting for at least 8 hours.  Glucose, capillary     Status: Abnormal   Collection Time: 09/27/21  5:39 PM  Result Value Ref Range   Glucose-Capillary 139 (H) 70 - 99 mg/dL    Comment: Glucose reference range applies only to samples taken after fasting for at least 8 hours.  Glucose, capillary     Status: Abnormal   Collection Time: 09/27/21  8:41 PM  Result Value Ref Range   Glucose-Capillary 168 (H) 70 - 99 mg/dL    Comment: Glucose reference range applies only to samples taken after fasting for at least 8 hours.  Glucose, capillary     Status: Abnormal   Collection Time: 09/28/21  2:56 AM  Result Value Ref Range   Glucose-Capillary 127 (H) 70 - 99 mg/dL    Comment: Glucose reference range applies only to samples taken after fasting for at least 8 hours.  Comprehensive metabolic panel     Status: Abnormal   Collection Time: 09/28/21  4:40 AM  Result Value Ref Range   Sodium 151 (H) 135 - 145 mmol/L   Potassium 4.1 3.5 - 5.1 mmol/L   Chloride 114 (H) 98 - 111 mmol/L   CO2 29 22 - 32 mmol/L   Glucose, Bld 181 (H) 70 - 99 mg/dL    Comment: Glucose reference range applies only to samples taken after fasting for  at least 8 hours.   BUN 35 (H) 6 - 20 mg/dL   Creatinine, Ser 9.93 0.61 - 1.24 mg/dL   Calcium 8.6 (L) 8.9 - 10.3 mg/dL   Total Protein 5.8 (L) 6.5 - 8.1 g/dL   Albumin 2.4 (L) 3.5 - 5.0 g/dL   AST 25 15 - 41 U/L   ALT 37 0 - 44 U/L   Alkaline Phosphatase 89 38 - 126 U/L   Total Bilirubin 0.5 0.3 - 1.2 mg/dL   GFR, Estimated >71 >69 mL/min    Comment: (NOTE) Calculated using the CKD-EPI Creatinine Equation (2021)    Anion gap 8 5 - 15    Comment: Performed at [facility], [address], [place], [place] [address]  Magnesium     Status: Abnormal   Collection Time: 09/28/21  4:40 AM  Result Value Ref Range   Magnesium 2.8 (H) 1.7 - 2.4 mg/dL    Comment: Performed at [facility], [address] [place], [place] [address]  Phosphorus      Status: None   Collection Time: 09/28/21  4:40 AM  Result Value Ref Range   Phosphorus 4.3 2.5 - 4.6 mg/dL    Comment: Performed at [facility], [address], [place], [place] [address]  CBC with Differential/Platelet     Status: Abnormal   Collection Time: 09/28/21  4:40 AM  Result Value Ref Range   WBC 14.7 (H) 4.0 - 10.5 K/uL   RBC 4.18 (L) 4.22 - 5.81 MIL/uL   Hemoglobin 11.9 (L) 13.0 - 17.0 g/dL   HCT 85.2 (L) 77.8 - 24.2 %   MCV 93.1 80.0 - 100.0 fL   MCH 28.5 26.0 - 34.0 pg   MCHC 30.6 30.0 - 36.0 g/dL   RDW 35.3 61.4 - 43.1 %   Platelets 238 150 - 400 K/uL   nRBC 0.0 0.0 - 0.2 %   Neutrophils Relative % 91 %   Neutro Abs 13.4 (H) 1.7 - 7.7 K/uL   Lymphocytes Relative 4 %   Lymphs Abs 0.6 (L) 0.7 - 4.0 K/uL   Monocytes Relative 4 %   Monocytes Absolute 0.5 0.1 - 1.0 K/uL   Eosinophils Relative 0 %   Eosinophils Absolute 0.0 0.0 - 0.5 K/uL   Basophils Relative 0 %   Basophils Absolute 0.0 0.0 - 0.1 K/uL   Immature Granulocytes 1 %   Abs Immature Granulocytes 0.16 (H) 0.00 - 0.07 K/uL    Comment: Performed at [facility], [address] [place], [place] [address]  Glucose, capillary     Status: Abnormal   Collection Time: 09/28/21  9:32 AM  Result Value Ref Range   Glucose-Capillary 253 (H) 70 - 99 mg/dL    Comment: Glucose reference range applies only to samples taken after fasting for at least 8 hours.  Glucose, capillary     Status: Abnormal   Collection Time: 09/28/21 12:43 PM  Result Value Ref Range   Glucose-Capillary 276 (H) 70 - 99 mg/dL    Comment: Glucose reference range applies only to samples taken after fasting for at least 8 hours.  Glucose, capillary     Status: Abnormal   Collection Time: 09/28/21  5:29 PM  Result Value Ref Range   Glucose-Capillary 217 (H) 70 - 99 mg/dL    Comment: Glucose reference range applies only to samples taken after fasting for at least 8 hours.  Glucose, capillary     Status: Abnormal  Collection Time:  09/28/21  9:10 PM  Result Value Ref Range   Glucose-Capillary 230 (H) 70 - 99 mg/dL    Comment: Glucose reference range applies only to samples taken after fasting for at least 8 hours.  Glucose, capillary     Status: Abnormal   Collection Time: 09/29/21  1:12 AM  Result Value Ref Range   Glucose-Capillary 238 (H) 70 - 99 mg/dL    Comment: Glucose reference range applies only to samples taken after fasting for at least 8 hours.  Comprehensive metabolic panel     Status: Abnormal   Collection Time: 09/29/21  4:26 AM  Result Value Ref Range   Sodium 144 135 - 145 mmol/L   Potassium 3.8 3.5 - 5.1 mmol/L   Chloride 107 98 - 111 mmol/L   CO2 29 22 - 32 mmol/L   Glucose, Bld 192 (H) 70 - 99 mg/dL    Comment: Glucose reference range applies only to samples taken after fasting for at least 8 hours.   BUN 30 (H) 6 - 20 mg/dL   Creatinine, Ser 0.72 0.61 - 1.24 mg/dL   Calcium 8.5 (L) 8.9 - 10.3 mg/dL   Total Protein 5.4 (L) 6.5 - 8.1 g/dL   Albumin 2.2 (L) 3.5 - 5.0 g/dL   AST 27 15 - 41 U/L   ALT 39 0 - 44 U/L   Alkaline Phosphatase 87 38 - 126 U/L   Total Bilirubin 0.5 0.3 - 1.2 mg/dL   GFR, Estimated >60 >60 mL/min    Comment: (NOTE) Calculated using the CKD-EPI Creatinine Equation (2021)    Anion gap 8 5 - 15    Comment: Performed at [facility], [place] [address], [place], [place] [address]  Magnesium     Status: Abnormal   Collection Time: 09/29/21  4:26 AM  Result Value Ref Range   Magnesium 2.6 (H) 1.7 - 2.4 mg/dL    Comment: Performed at [place] [address] [place], [place] [address]  Phosphorus     Status: None   Collection Time: 09/29/21  4:26 AM  Result Value Ref Range   Phosphorus 3.6 2.5 - 4.6 mg/dL    Comment: Performed at [facility] [address], [place],  [address]  CBC with Differential/Platelet     Status: Abnormal   Collection Time: 09/29/21  4:26 AM  Result Value Ref Range   WBC 13.8 (H) 4.0 - 10.5 K/uL   RBC 4.06 (L)  4.22 - 5.81 MIL/uL   Hemoglobin 11.5 (L) 13.0 - 17.0 g/dL   HCT 36.6 (L) 39.0 - 52.0 %   MCV 90.1 80.0 - 100.0 fL   MCH 28.3 26.0 - 34.0 pg   MCHC 31.4 30.0 - 36.0 g/dL   RDW 14.4 11.5 - 15.5 %   Platelets 205 150 - 400 K/uL   nRBC 0.0 0.0 - 0.2 %   Neutrophils Relative % 82 %   Neutro Abs 11.2 (H) 1.7 - 7.7 K/uL   Lymphocytes Relative 10 %   Lymphs Abs 1.4 0.7 - 4.0 K/uL   Monocytes Relative 6 %   Monocytes Absolute 0.9 0.1 - 1.0 K/uL   Eosinophils Relative 0 %   Eosinophils Absolute 0.1 0.0 - 0.5 K/uL   Basophils Relative 0 %   Basophils Absolute 0.0 0.0 - 0.1 K/uL   Immature Granulocytes 2 %   Abs Immature Granulocytes 0.22 (H) 0.00 - 0.07 K/uL    Comment: Performed at  [facility], [address] [place], [place] [address]  Glucose, capillary     Status: Abnormal   Collection Time: 09/29/21  5:17 AM  Result Value Ref Range   Glucose-Capillary 201 (H) 70 - 99 mg/dL    Comment: Glucose reference range applies only to samples taken after fasting for at least 8 hours.    Recent Results (from the past 240 hour(s))  CSF culture w Gram Stain     Status: None   Collection Time: 09/22/21  1:48 PM   Specimen: CSF; Cerebrospinal Fluid  Result Value Ref Range Status   Specimen Description CSF  Final   Special Requests NONE  Final   Gram Stain   Final    WBC PRESENT, PREDOMINANTLY MONONUCLEAR NO ORGANISMS SEEN CYTOSPIN SMEAR    Culture   Final    NO GROWTH 3 DAYS Performed at [facility], [address], [place], [place] [address]    Report Status 09/25/2021 FINAL  Final  Culture, fungus without smear     Status: None (Preliminary result)   Collection Time: 09/22/21  1:49 PM   Specimen: CSF; Cerebrospinal Fluid  Result Value Ref Range Status   Specimen Description CSF  Final   Special Requests NONE  Final   Culture   Final    NO FUNGUS ISOLATED AFTER 6 DAYS Performed at [facility], [address] [place], [place] [address]    Report Status PENDING   Incomplete  Anaerobic culture w Gram Stain     Status: None   Collection Time: 09/22/21  1:49 PM   Specimen: CSF; Cerebrospinal Fluid  Result Value Ref Range Status   Specimen Description CSF  Final   Special Requests NONE  Final   Gram Stain NO WBC SEEN NO ORGANISMS SEEN   Final   Culture   Final    NO ANAEROBES ISOLATED Performed at [facility], [address], [place], [place] [address]    Report Status 09/27/2021 FINAL  Final    Lipid Panel No results for input(s): "CHOL", "TRIG", "HDL", "CHOLHDL", "VLDL", "LDLCALC" in the last 72 hours.  Studies/Results: No results found.  Medications: Scheduled:  amLODipine  10 mg Per Tube Daily   aspirin  81 mg Per Tube Daily   Chlorhexidine Gluconate Cloth  6 each Topical Daily   docusate  100 mg Per Tube Daily   feeding supplement (PROSource TF20)  60 mL Per Tube BID   free water  400 mL Per Tube Q6H   heparin  5,000 Units Subcutaneous Q8H   [START ON 09/30/2021] influenza vac split quadrivalent PF  0.5 mL Intramuscular Tomorrow-1000   insulin aspart  0-15 Units Subcutaneous Q4H   insulin aspart  0-5 Units Subcutaneous QHS   insulin glargine-yfgn  35 Units Subcutaneous BID   LORazepam  1 mg Intravenous Once   mouth rinse  15 mL Mouth Rinse Q2H   pantoprazole  40 mg Per Tube BID   [START ON 09/30/2021] pneumococcal 23 valent vaccine  0.5 mL Intramuscular Tomorrow-1000   polyethylene glycol  17 g Per Tube Daily   rosuvastatin  40 mg Per Tube Daily   sodium chloride flush  10-40 mL Intracatheter Q12H   sodium chloride flush  5 mL Intracatheter Q8H   Zinc Oxide   Topical BID   Continuous:  sodium chloride Stopped (09/15/21 1519)   sodium chloride     dextrose     dextrose 75 mL/hr at 09/29/21  0700   feeding supplement (VITAL 1.5 CAL) 1,000 mL (09/29/21 0844)   lactated ringers     Assessment: 61 year old patient with history of stroke in April of this year with minimal residual deficits, HTN, HLD, DM and recent jejunal  bleed, presented with continued AMS and concerning multifocal enhancing white matter lesions on MRI that exhibit morphologies and a distribution suggestive of MS or ADEM, with vasculitis also a consideration, but lower on the differential diagnosis. He has no prior history of demyelinating disease and wife states no prior episodes of focal weakness or vision loss. Also with no family history of MS. Spine imaging negative for cord lesions. Has completed a 5 day course of IV Solumedrol as empiric treatment for the lesions without improvement in presentation. - Serial exam findings: - Exam today: Unchanged.  Does not follow commands.  Spontaneous antigravity movements of left > right upper extremity.  Bilateral lower extremities move equally without gravity.  Does not attempt to speak.  Per wife at bedside, patient remains significantly altered from baseline. - Imaging: - White matter changes on MRI with multiple foci of bland restricted diffusion may represent strokes, although pattern would be unusual. The locations and morphologies of the lesions could be consistent with demyelinating disease, but the enhancing pattern would be atypical. Infectious etiology unlikely as there is no perilesional edema or necrotic core associated with the lesions. - MRI brain follow up post-contrast imaging reveals >20 contrast enhancing lesions in the white matter bilaterally. Strongly favor inflammation / active demyelination possibly MS vs ADEM on initial interpretation. Based on personal review of the images by they seem more complatible with ADEM or a vasculitic process than with MS. Sequela of septic shock is suspected, but I could not find any literature with specific brain imaging to support or refute this component of the DDx.  - MRI of cervical and thoracic spine w/wo contrast: Markedly limited exam for assessment of the spinal cord due to the degree of motion artifact. Within this limitation, there is no definite  evidence of active demyelination. Within limitations of motion artifact, there may be demyelinating lesions at the C6 and T1 vertebral body levels. Repeat exam is recommended if more definitive characterization as clinically warranted. - Inflammation favored over infection given that he has been afebrile and primarily altered but not ill-appearing therefore no indication for empiric CNS coverage at this time.  - LP was performed by C[facility] on 9/18. CSF findings: - Glucose 126, RBC count 1-5 (unremarkable), clear, colorless, total protein normal, WBC normal.  - Gram stain: Negative - Fungal and bacterial cultures pending - Infectious disease Bio[employer] panel negative for all pathogens tested, including cryptococcus, VZV and HSV.  - Cryptococcal Ag negative.  - IgG index is normal at 0.5.  - OCBs testing has resulted: No oligoclonal bands seen - Serum labs:  - Vitamin B12 level consistent with supplementation. Mildly elevated Na. BUN 30. ALT 48. TSH normal. ANA negative.  - C3 complement level elevated at 184. Autoimmune panel otherwise negative.  - HIV screen negative.  - DDx for AMS: May be secondary to a CNS inflammatory process as evidenced by the enhancing lesions seen on brain MRI. Inflammation may not be restricted only to the regions of enhancement visible on MRI. Sequelae of end organ damage from his septic shock is also on the DDx (see footnote below). 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  - DDx for white matter lesions on MRI: ADEM, multiple sclerosis and cerebral vasculitis (isolated or as a component of a possible systemic vasculitis) are felt to be highest on the DDx.  - Assessment of response to 5 day course of pulsed-dose steroids (completed on Saturday 9/23): Mild improvement gradually over the 5 day period however, patient remains significantly altered  from his baseline at S.   Recommendations: - OOB to chair when possible to reduce the risk of developing an ICU neuropathy or myopathy - Repeat MRI brain, cervical, thoracic spine w/wo contrast. He has a vascular coil and surgical clips overlying the left hemiabdomen on recent abdominal x-ray and MRI techs should be reminded of this prior to scanning  - CT angio head and neck with without for further evaluation of potential vasculitis etiology - Follow up neuroimaging as it is complete - Discussed PLEX therapy with patient's wife at bedside.  Will consult IR for nontunneled catheter placement and will plan to initiate PLEX 9/26 due to inadequate response to pulse-dose steroids.    LOS: 25 days  -- Lanae Boast, AGACNP-BC [facility] [phone]  NEUROHOSPITALIST ADDENDUM Performed a face to face diagnostic evaluation.   I have reviewed the contents of history and physical exam as documented by PA/ARNP/Resident and agree with above documentation.  I have discussed and formulated the above plan as documented. Edits to the note have been made as needed.  Impression/Key exam findings/Plan: certainly a complex case. Initially presented with GI bleed, then cholecystitis and MRI obtained for persistent AMS which was initially felt to be due to sepsis. MRI is most concerning for demyelinating lesions. End organ damage from sepsis is a consideration but the appearance is somewhat atypical. If it was a potential embolic shower or hypotensive event, would expect more of a watershed distribution. If this was a CNS infections, would typically expect ring enhancing lesions with a potential necrotic core. Overall, high suspicion for autoimmune etiology. He did have matched bands on testing for oligoclonal bands. Which suggests peripheral antibody production but could also be related to his recent cholecystitis.  He has not had much improvement with 5 days of IVMP 1000mg . We will get repeat MRI  Brain to evaluate for response to steroids and also get MRI C and T spine given initial spine imaging was motion degraded and there was some concern for a C6 and a T1 lesion. Will also get CT angio head and neck given vasculitis is on the differential still.  I spoke with his wife and discussed that we should consider PLEX as an option given no significant improvement with steroids. Will wait for the imaging above to return.  , MD [facility] Erick Blinks   If 7pm to 7am, please call on call as listed on AMION.

## 2021-09-29 NOTE — Progress Notes (Signed)
PROGRESS NOTE    Troy Randall  BXI:[id number] DOB: Dec 13, 1960 DOA: 09/04/2021 PCP: Benita Stabile, MD    Brief Narrative:   Patient is a 61 years old male with medical history of hypertension, hyperlipidemia, type 2 diabetes with hyperglycemia, CVA, presented to the hospital with GI bleed.  Patient underwent IR embolization followed by percutaneous cholecystostomy tube placement due to ongoing altered mental status.  MRI of the brain showed numerous contrast-enhancing bilateral white matter lesions concerning for myelinating disease.  Neurology was consulted and underwent lumbar puncture on 09/22/2021 with low suspicion for infectious etiology.  Neurology has initiated the patient on high-dose steroids on 09/23/2021.  Patient was then transferred to hospitalist service on 09/24/2021.    Significant events. 8/31 presented to [facility], [name] Admit, Gi Consult 9/1 CTA> no evidence of active GI bleed, 2 U PRBC 9/2 2 U PRBC, colonoscopy > no active bleed found but large clots throughout entire colon. EGD> small hiatal hernia, erosive gastropathy with no stigmata of recent bleeding, and non-bleeding duodenal diverticulum.   Underwent then small bowel enteroscopy which noted intermittent blood pumping in the proximal jejunum but was unable to be reached despite multiple attempts, was injected and clip placed. Repeat CTA repeated, which showed positive jejunal diverticular bleeding. IR> active extrav at [facility] territory, jejunal arcade branch, successful coil embo 9/3 Intubated. CTA> no evidence of active GI bleeding.  New finding of abnormal gallbladder with nondependent air, gallbladder wall air, pericholecystic and right upper quadrant information.  IR placed percutaneous cholecystotomy tube.  9/4 R. IJ central line placed with dialysis triple lumen catheter 9/5 CRRT initiated 9/6 Bedside EGD without any evidence of bleeding. CTA a/p without etiology for bleed 9/8 CRRT discontinued. Required ETT exchange  yesterday due to blown cuff. 9/11 Bronchoscopic guided percutaneous tracheostomy placement.  Right IJ central line removed. 9/15 flexible sigmoidoscopy with removal of endoscopy 9/16 MRI brain w/o contrast amended study secondary to range of motion.  Rapid development numerous round white matter lesions in both cerebral hemispheres.  Prior MRIs. 9/17 MRI brain: Numerous contrast-enhancing bilateral white matter lesions, consistent with active demyelination. 9/18 Lumbar puncture  9/19 MRI cervical spine, thoracic : Limited exam, within limitations there is not definitive evidence of active demyelinization. Within limitations of motion artifact, there may be demyelinating lesions at the C6 and T1 vertebral body levels. -S/p  LP on 9/18. 9/25-clinical exam not improved despite completion of 5-day IV steroids.  Neurology planning to repeat MRI of the brain cervical spine thoracic spine.  IR consult for catheter placement for possible Plex therapy    Assessment and Plan: Active Problems:   Uncontrolled type 2 diabetes mellitus with hyperglycemia, without long-term current use of insulin (HCC)   Essential hypertension   Hyperlipidemia   Chronic respiratory failure with hypoxia (HCC)   Tracheostomy status (HCC)   Acute encephalopathy   Anemia due to blood loss   History of lower GI bleeding: 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    H/O ischemic bowel disease   Acute emphysematous cholecystitis   Acute Metabolic Encephalopathy/Hypoxic ischemic Encephalopathy/ Hx CVA wo residual deficits MRI brain scan consistent with multiple sclerosis versus acute disseminated encephalomyelitis.  Was started on high-dose IV steroids and has completed 5-day course.  Not much of improvement.  Possibility of multiple sclerosis/ADEM.  Patient received 5-day course of IV steroids without much improvement.  MRI of the cervical and thoracic spine was done yesterday which was limited with artifact.   Lumbar  puncture/negative for cryptococcus varicella HSV cryptococcal antigen.  Neurology recommending repeating MRI of the brain cervical thoracic spine with and without contrast.  Neurology planning for PLEX therapy and IR has been consulted for nontunneled dialysis catheter placement likely to be initiated 9/26.  Acute hypoxic respiratory failure status post cutaneous tracheostomy 9/11: On 9/11 s/p Tracheostomy, continue trach care.   Hemorrhagic shock/ABLA/ secondary to lower GI bleed, jejunal diverticular and ulcer secondary to ischemia Acute blood loss and iron deficiency anemia  Patient underwent coil embolization of the distal branch of SMA on 09/06/2021.  Also had endoscopic capsule done.  Had multiple blood transfusion during hospitalization.  Has been started on low-dose aspirin 09/23/2021.  Continue Protonix twice daily   Acute emphysematous E. coli cholecystitis   s/p percutaneous cholecystostomy drain placement on 09/07/2021. Completed 7-day course of ceftriaxone.  Follow-up with surgery and IR as outpatient.   AKI -9/8 off CRRT has improved.  Nephrology has signed off.  Continue to monitor renal function.   Hypernatremia:   Free water increased to 400 mL Q6 on 09/26/2021.    We will discontinue D5 water.  Continue tube flushes.  Check BMP in AM.   DM type II uncontrolled with Hyperglycemia  hemoglobin A1c= 6.2.  On Semglee 35 units twice daily. sliding scale insulin to moderate scale since off steroids.  De-escalate insulin as clinically indicated   Essential HTN Continue amlodipine   Pulmonary nodules in bilateral bases Never smoker.   Obesity (BMI 36.84 kg/m.) Present on admission.  Currently on cortrak tube tube feeding.  Status post hernia repair with mesh 2011.  Question PEG tube on discharge.    DVT prophylaxis: Place and maintain sequential compression device Start: 09/26/21 1759 heparin injection 5,000 Units Start: 09/22/21 1600 Place and maintain sequential  compression device Start: 09/04/21 1914   Code Status:     Code Status: Full Code  Disposition: Uncertain at this time  Status is: Inpatient  Remains inpatient appropriate because: Multiple issues, tube feeding, pending clinical improvement,  status post tracheostomy, neurology follow-up   Family Communication:  I again spoke with the patient's wife at bedside on 09/29/2021.  Consultants:  General surgery Neurology Critical care  Procedures:  PRBC transfusion CRRT NG tube placement Coil embolization EGD/colonoscopy/small bowel enteroscopy Intubation and mechanical ventilation Tracheostomy placement Percutaneous cholecystostomy tube placement  Antimicrobials:  None currently  Subjective: Today, patient was seen and examined at bedside.  Nonverbal no significant improvement noted.Marland Kitchen  He does opens eyes spontaneously but remains altered.  Moves extremities.   Objective: Vitals:   09/29/21 0100 09/29/21 0342 09/29/21 0749 09/29/21 1126  BP:   (!) 153/96   Pulse: 60 (!) 55 (!) 126 (!) 59  Resp: (!) 21 16 (!) 21 (!) 22  Temp:      TempSrc:      SpO2: 98% 99% 97% 96%  Weight:      Height:        Intake/Output Summary (Last 24 hours) at 09/29/2021 1305 Last data filed at 09/29/2021 0700 Gross per 24 hour  Intake 3125.53 ml  Output 1200 ml  Net 1925.53 ml    Filed Weights   09/23/21 0500 09/26/21 0500 09/27/21 0500  Weight: 120.3 kg 123.2 kg 123.5 kg    Physical Examination: Body mass index is 36.93 kg/m.   General: Obese built, not in obvious distress not following commands, nonverbal HENT:   No scleral pallor or icterus noted. Oral mucosa is moist.  Cortrak tube  in place Chest: .  Diminished breath sounds bilaterally. No crackles or wheezes.  CVS: S1 &S2 heard. No murmur.  Regular rate and rhythm. Abdomen: Soft, nontender, nondistended.  Bowel sounds are heard.  Right upper quadrant cholecystostomy tube in place.  External Foley catheter in  place Extremities: No cyanosis, clubbing or edema.  Peripheral pulses are palpable.  Right lower extremity PICC line in place. Psych: Awake but does not follow commands, moves extremities continuously, nonverbal CNS: Nonverbal, moves extremities Skin: Warm and dry.     Data Reviewed:   CBC: Recent Labs  Lab 09/24/21 0830 09/25/21 1028 09/26/21 0335 09/27/21 0300 09/28/21 0440 09/29/21 0426  WBC 15.1* 16.6* 13.6* 15.5* 14.7* 13.8*  NEUTROABS 14.3*  --  12.4* 14.3* 13.4* 11.2*  HGB 10.9* 11.7* 11.8* 12.0* 11.9* 11.5*  HCT 34.8* 37.6* 38.7* 38.7* 38.9* 36.6*  MCV 90.6 91.9 93.0 91.9 93.1 90.1  PLT 278 305 268 252 238 205     Basic Metabolic Panel: Recent Labs  Lab 09/24/21 0343 09/25/21 1028 09/26/21 1215 09/27/21 0300 09/28/21 0440 09/29/21 0426  NA 148* 150* 150* 153* 151* 144  K 4.2 3.8 4.0 3.8 4.1 3.8  CL 114* 116* 116* 120* 114* 107  CO2 25 28 24 27 29 29   GLUCOSE 348* 271* 242* 218* 181* 192*  BUN 30* 35* 36* 37* 35* 30*  CREATININE 0.85 0.82 0.87 0.86 0.78 0.72  CALCIUM 8.9 8.9 8.8* 8.8* 8.6* 8.5*  MG  --  2.4 2.5* 2.6* 2.8* 2.6*  PHOS 3.5  --  3.7 3.8 4.3 3.6     Liver Function Tests: Recent Labs  Lab 09/24/21 0343 09/26/21 1215 09/27/21 0300 09/28/21 0440 09/29/21 0426  AST  --  29 22 25 27   ALT  --  40 40 37 39  ALKPHOS  --  100 93 89 87  BILITOT  --  0.6 0.4 0.5 0.5  PROT  --  6.4* 6.2* 5.8* 5.4*  ALBUMIN 2.3* 2.5* 2.4* 2.4* 2.2*      Radiology Studies: No results found.    LOS: 25 days    Flora Lipps, MD [facility] Available via Epic secure chat 7am-7pm After these hours, please refer to coverage provider listed on [email or web address] 09/29/2021, 1:05 PM

## 2021-09-29 NOTE — Progress Notes (Signed)
NAME:  Troy Randall, MRN:  [id number], DOB:  [birth year], LOS: 25 ADMISSION DATE:  09/04/2021, CONSULTATION DATE:  09/06/21 REFERRING MD:  [name], CHIEF COMPLAINT:  melena   History of Present Illness:   Troy Randall is an 61 y.o. male with prior history of HTN, HLD, DM, and CVA on ASA/ plavix who presented to [facility] on 8/31 after developing frequent 6-7 dark stools and feeling dizzy and lightheaded on standing.  No hx of NSAIDs.  FOBT positive and initial Hgb 12.2.    Patient admitted to [facility] with GI consulting.  Remained orthostatic with progressive decline in Hgb.  CTA abdomen did not show any active bleed, therefore underwent colonoscopy > no active bleed found but large clots throughout entire colon.  Then underwent EGD which showed a small hiatal hernia, erosive gastropathy with no stigmata of recent bleeding, and non-bleeding duodenal diverticulum.   Underwent then small bowel enteroscopy which noted intermittent blood pumping in the proximal jejunum but was unable to be reached despite multiple attempts, was injected and clip placed.  Repeat CTA repeated, which showed positive jejunal diverticular bleeding. Patient was transferred to [facility] for IR involvement. In IR active extravasation  at [facility] territory, jejunal arcade branch, successful coil embolization.  PCCM was consulted for transfer and admission.  Pertinent  Medical History  HTN, HLD, DM, CVA on ASA/ plavix  Significant Hospital Events: Including procedures, antibiotic start and stop dates in addition to other pertinent events   8/31 presented to [facility] ED, [name] Admit, Gi Consult 9/1 CTA> no evidence of active GI bleed, 2 U PRBC 9/2 2 U PRBC, colonoscopy > no active bleed found but large clots throughout entire colon. EGD> small hiatal hernia, erosive gastropathy with no stigmata of recent bleeding, and non-bleeding duodenal diverticulum.   Underwent then small bowel enteroscopy which noted intermittent blood pumping in the proximal jejunum  but was unable to be reached despite multiple attempts, was injected and clip placed. Repeat CTA repeated, which showed positive jejunal diverticular bleeding. IR> active extrav at [facility] territory, jejunal arcade branch, successful coil embo 9/3 Intubated. CTA> no evidence of active GI bleeding.  New finding of abnormal gallbladder with nondependent air, gallbladder wall air, pericholecystic and right upper quadrant information.  IR placed percutaneous cholecystotomy tube.  9/4 R. IJ central line placed with dialysis triple lumen catheter 9/5 CRRT initiated 9/6 Bedside EGD without any evidence of bleeding. CTA a/p without etiology for bleed 9/8 CRRT discontinued. Required ETT exchange yesterday due to blown cuff. 9/11 Bronchoscopic guided percutaneous tracheostomy placement.  Right IJ central line removed. 9/15 flexible sigmoidoscopy with removal of endoscopy 9/16 MRI brain w/o contrast amended study secondary to range of motion.  Rapid development numerous round white matter lesions in both cerebral hemispheres.  Prior MRIs. 9/17 MRI brain with contrast numerous contrast-enhancing bilateral white matter lesions consistent with active demyelination 9/18 Lumbar puncture  9/19 transfer to ICU Interim History / Subjective:   Last seen by our service 9/18. Interim course reviewed -has completed 5-day course of Solu-Medrol for white matter lesions on MRI, and imaging negative -differential of multiple sclerosis, cerebral vasculitis, other demyelinating process Remains on trach collar  Objective   Blood pressure (!) 153/96, pulse (!) 126, temperature 98.2 F (36.8 C), temperature source Axillary, resp. rate (!) 21, height 6' (1.829 m), weight 123.5 kg, SpO2 97 %. FiO2 (%):  [28 %] 28 %   Intake/Output Summary (Last 24 hours) at 09/29/2021 1008 Last data filed at 09/29/2021 0700 Gross  per 24 hour  Intake 3125.53 ml  Output 1200 ml  Net 1925.53 ml    Filed Weights   09/23/21 0500 09/26/21 0500  09/27/21 0500  Weight: 120.3 kg 123.2 kg 123.5 kg   Physical Exam: General: Chronically ill-appearing, no acute distress HENT: [place], AT, MV via tracheostomy Eyes: Eyes open, not tracking.   Respiratory: No accessory muscle use, clear breath sounds bilateral Cardiovascular: S1-2 regular GI: BS+, soft, non-tender. Percutaneous chole drain in place.  Extremities: Trace edema of lower extremities Neuro: Does not follow commands, moving all 4 extremities sporadically. GU: External catheter  Labs show normal electrolytes, mild decreasing leukocytosis  Resolved Hospital Problem list   AKI  - off CRRT 9/8 Bilateral nonobstructing nephrolithiasis - suspect chronic BPH Hypernatremia Septic shock Hemorrhagic shock - resolved ABLA secondary to lower GI bleed, jejunal diverticular and ulcers secondary to ischemia Acute blood loss and iron deficiency anemia, secondary to above - 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 and bedside EGD 9/6. Endoscopy capsule removal 9/15  Assessment & Plan:    Acute hypoxemic respiratory failure s/p percutaneous tracheostomy 9/11 -Continue trach care -Speech following, Passy-Muir valve evaluation   Acute encephalopathy, possibly secondary to hypoxic-ischemic encephalopathy History of CVA 04/2021  without residual deficits, concern for recurrence  -Neurology following  -Completed 5 days of Solu-Medrol, PLE X planned   Ecoli Cholecystitis s/p percutaneous cholecystostomy drain placement 9/3 percutaneous cholecystostomy drain placed and continues to have adequate drainage. -Completed total 7d of antibiotics: Ceftriaxone  -Will need outpatient surgery and IR follow up    Pulmonary nodules in bilateral bases, L 17mm Family reports patient is a never smoker. Family does endorse dipping. -no further follow up needed for a low risk patient based on fleischner criteria    Best Practice    Diet/type: TF DVT prophylaxis: heparin  GI  prophylaxis: PPI Lines: Foley, PICC RUE Code Status:  full code Last date of multidisciplinary goals of care discussion [Full scope- Wife updated at bedside this morning   PCCM will see again next week, call as needed  Kara Mead MD. FCCP. [place] [facility] Pager : [phone]  If no response to pager , please call [phone] until 7 pm After 7:00 pm call Elink  [phone]   09/29/2021

## 2021-09-30 ENCOUNTER — Inpatient Hospital Stay (HOSPITAL_COMMUNITY): Payer: Medicare HMO

## 2021-09-30 DIAGNOSIS — J9611 Chronic respiratory failure with hypoxia: Secondary | ICD-10-CM | POA: Diagnosis not present

## 2021-09-30 DIAGNOSIS — A498 Other bacterial infections of unspecified site: Secondary | ICD-10-CM | POA: Diagnosis not present

## 2021-09-30 DIAGNOSIS — I10 Essential (primary) hypertension: Secondary | ICD-10-CM | POA: Diagnosis not present

## 2021-09-30 DIAGNOSIS — D500 Iron deficiency anemia secondary to blood loss (chronic): Secondary | ICD-10-CM | POA: Diagnosis not present

## 2021-09-30 DIAGNOSIS — E785 Hyperlipidemia, unspecified: Secondary | ICD-10-CM | POA: Diagnosis not present

## 2021-09-30 DIAGNOSIS — D62 Acute posthemorrhagic anemia: Secondary | ICD-10-CM | POA: Diagnosis not present

## 2021-09-30 DIAGNOSIS — K922 Gastrointestinal hemorrhage, unspecified: Secondary | ICD-10-CM | POA: Diagnosis not present

## 2021-09-30 DIAGNOSIS — Z8719 Personal history of other diseases of the digestive system: Secondary | ICD-10-CM | POA: Diagnosis not present

## 2021-09-30 DIAGNOSIS — G9340 Encephalopathy, unspecified: Secondary | ICD-10-CM | POA: Diagnosis not present

## 2021-09-30 DIAGNOSIS — N179 Acute kidney failure, unspecified: Secondary | ICD-10-CM | POA: Diagnosis not present

## 2021-09-30 DIAGNOSIS — R578 Other shock: Secondary | ICD-10-CM | POA: Diagnosis not present

## 2021-09-30 DIAGNOSIS — K810 Acute cholecystitis: Secondary | ICD-10-CM | POA: Diagnosis not present

## 2021-09-30 LAB — CBC WITH DIFFERENTIAL/PLATELET
Abs Immature Granulocytes: 0.25 10*3/uL — ABNORMAL HIGH (ref 0.00–0.07)
Basophils Absolute: 0 10*3/uL (ref 0.0–0.1)
Basophils Relative: 0 %
Eosinophils Absolute: 0.3 10*3/uL (ref 0.0–0.5)
Eosinophils Relative: 3 %
HCT: 34.4 % — ABNORMAL LOW (ref 39.0–52.0)
Hemoglobin: 10.9 g/dL — ABNORMAL LOW (ref 13.0–17.0)
Immature Granulocytes: 2 %
Lymphocytes Relative: 10 %
Lymphs Abs: 1.2 10*3/uL (ref 0.7–4.0)
MCH: 28.3 pg (ref 26.0–34.0)
MCHC: 31.7 g/dL (ref 30.0–36.0)
MCV: 89.4 fL (ref 80.0–100.0)
Monocytes Absolute: 0.7 10*3/uL (ref 0.1–1.0)
Monocytes Relative: 6 %
Neutro Abs: 9.4 10*3/uL — ABNORMAL HIGH (ref 1.7–7.7)
Neutrophils Relative %: 79 %
Platelets: 155 10*3/uL (ref 150–400)
RBC: 3.85 MIL/uL — ABNORMAL LOW (ref 4.22–5.81)
RDW: 14.4 % (ref 11.5–15.5)
WBC: 11.8 10*3/uL — ABNORMAL HIGH (ref 4.0–10.5)
nRBC: 0 % (ref 0.0–0.2)

## 2021-09-30 LAB — GLUCOSE, CAPILLARY
Glucose-Capillary: 200 mg/dL — ABNORMAL HIGH (ref 70–99)
Glucose-Capillary: 172 mg/dL — ABNORMAL HIGH (ref 70–99)
Glucose-Capillary: 158 mg/dL — ABNORMAL HIGH (ref 70–99)
Glucose-Capillary: 145 mg/dL — ABNORMAL HIGH (ref 70–99)
Glucose-Capillary: 161 mg/dL — ABNORMAL HIGH (ref 70–99)
Glucose-Capillary: 155 mg/dL — ABNORMAL HIGH (ref 70–99)
Glucose-Capillary: 174 mg/dL — ABNORMAL HIGH (ref 70–99)

## 2021-09-30 LAB — MAGNESIUM: Magnesium: 2.3 mg/dL (ref 1.7–2.4)

## 2021-09-30 LAB — COMPREHENSIVE METABOLIC PANEL
ALT: 34 U/L (ref 0–44)
AST: 23 U/L (ref 15–41)
Albumin: 1.9 g/dL — ABNORMAL LOW (ref 3.5–5.0)
Alkaline Phosphatase: 86 U/L (ref 38–126)
Anion gap: 6 (ref 5–15)
BUN: 21 mg/dL — ABNORMAL HIGH (ref 6–20)
CO2: 29 mmol/L (ref 22–32)
Calcium: 8 mg/dL — ABNORMAL LOW (ref 8.9–10.3)
Chloride: 106 mmol/L (ref 98–111)
Creatinine, Ser: 0.63 mg/dL (ref 0.61–1.24)
GFR, Estimated: >60 mL/min (ref >60)
Glucose, Bld: 161 mg/dL — ABNORMAL HIGH (ref 70–99)
Potassium: 3.7 mmol/L (ref 3.5–5.1)
Sodium: 141 mmol/L (ref 135–145)
Total Bilirubin: 0.7 mg/dL (ref 0.3–1.2)
Total Protein: 5.1 g/dL — ABNORMAL LOW (ref 6.5–8.1)

## 2021-09-30 LAB — PHOSPHORUS: Phosphorus: 3.6 mg/dL (ref 2.5–4.6)

## 2021-09-30 MED ORDER — IOHEXOL 350 MG/ML SOLN
70.0000 mL | Freq: Once | INTRAVENOUS | Status: AC | PRN
  Administered 2021-09-30: 70 mL via INTRAVENOUS

## 2021-09-30 MED ORDER — LORAZEPAM 2 MG/ML IJ SOLN
INTRAMUSCULAR | Status: AC
  Filled 2021-09-30: qty 1

## 2021-09-30 MED ORDER — LORAZEPAM 2 MG/ML IJ SOLN
1.0000 mg | Freq: Once | INTRAMUSCULAR | Status: AC
  Administered 2021-09-30: 1 mg via INTRAVENOUS
  Filled 2021-09-30: qty 1

## 2021-09-30 MED ORDER — LORAZEPAM 2 MG/ML IJ SOLN
1.0000 mg | Freq: Once | INTRAMUSCULAR | Status: AC
  Administered 2021-09-30: 1 mg via INTRAVENOUS

## 2021-09-30 MED ORDER — LORAZEPAM 2 MG/ML IJ SOLN
1.0000 mg | Freq: Once | INTRAMUSCULAR | Status: AC
  Filled 2021-09-30: qty 1

## 2021-09-30 NOTE — Progress Notes (Signed)
PROGRESS NOTE    Troy Randall  [id number] DOB: 01-Oct-1960 DOA: 09/04/2021 PCP: [name], MD    Brief Narrative:   Patient is a 61 years old male with medical history of hypertension, hyperlipidemia, type 2 diabetes with hyperglycemia, CVA, presented to the hospital with GI bleed.  Patient underwent IR embolization followed by percutaneous cholecystostomy tube placement due to ongoing altered mental status.  MRI of the brain showed numerous contrast-enhancing bilateral white matter lesions concerning for myelinating disease.  Neurology was consulted and underwent lumbar puncture on 09/22/2021 with low suspicion for infectious etiology.  Neurology has initiated the patient on high-dose steroids on 09/23/2021.  Patient was then transferred to hospitalist service on 09/24/2021.    Significant events. 8/31 presented to [facility] ED, [name] Admit, Gi Consult 9/1 CTA> no evidence of active GI bleed, 2 U PRBC 9/2 2 U PRBC, colonoscopy > no active bleed found but large clots throughout entire colon. EGD> small hiatal hernia, erosive gastropathy with no stigmata of recent bleeding, and non-bleeding duodenal diverticulum.   Underwent then small bowel enteroscopy which noted intermittent blood pumping in the proximal jejunum but was unable to be reached despite multiple attempts, was injected and clip placed. Repeat CTA repeated, which showed positive jejunal diverticular bleeding. IR> active extrav at [facility] territory, jejunal arcade branch, successful coil embo 9/3 Intubated. CTA> no evidence of active GI bleeding.  New finding of abnormal gallbladder with nondependent air, gallbladder wall air, pericholecystic and right upper quadrant information.  IR placed percutaneous cholecystotomy tube.  9/4 R. IJ central line placed with dialysis triple lumen catheter 9/5 CRRT initiated 9/6 Bedside EGD without any evidence of bleeding. CTA a/p without etiology for bleed 9/8 CRRT discontinued. Required ETT exchange  yesterday due to blown cuff. 9/11 Bronchoscopic guided percutaneous tracheostomy placement.  Right IJ central line removed. 9/15 flexible sigmoidoscopy with removal of endoscopy 9/16 MRI brain w/o contrast amended study secondary to range of motion.  Rapid development numerous round white matter lesions in both cerebral hemispheres.  Prior MRIs. 9/17 MRI brain: Numerous contrast-enhancing bilateral white matter lesions, consistent with active demyelination. 9/18 Lumbar puncture  9/19 MRI cervical spine, thoracic : Limited exam, within limitations there is not definitive evidence of active demyelinization. Within limitations of motion artifact, there may be demyelinating lesions at the C6 and T1 vertebral body levels. -S/p  LP on 9/18. 9/25-clinical exam not improved despite completion of 5-day IV steroids.  Neurology planning to repeat MRI of the brain cervical spine thoracic spine. Possible IR consult for catheter placement for possible Plex therapy if abnormal scans 9/26- scans attempted with ativan    Assessment and Plan: Active Problems:   Uncontrolled type 2 diabetes mellitus with hyperglycemia, without long-term current use of insulin (HCC)   Essential hypertension   Hyperlipidemia   Chronic respiratory failure with hypoxia (HCC)   Tracheostomy status (HCC)   Acute encephalopathy   Anemia due to blood loss   History of lower GI bleeding: 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    H/O ischemic bowel disease   Acute emphysematous cholecystitis   Acute Metabolic Encephalopathy/Hypoxic ischemic Encephalopathy/ Hx CVA wo residual deficits MRI brain scan consistent with multiple sclerosis versus acute disseminated encephalomyelitis.  Patient was started on high-dose IV steroids and has completed 5-day course.  Not much of improvement.    Possibility of multiple sclerosis/ADEM.  Patient received 5-day course of IV steroids without much improvement.   Lumbar puncture/negative  for cryptococcus varicella  HSV cryptococcal antigen. Neurology has seen the patient and recommended repeating MRI of the brain and cervical spine thoracic spine to see any changes after treatment.  Might need to go on plasma exchange depending upon imaging findings.  Spoke with neurology about it.  Acute hypoxic respiratory failure status post  tracheostomy 9/11: On 9/11 s/p Tracheostomy, continue trach care.   Hemorrhagic shock/ABLA/ secondary to lower GI bleed, jejunal diverticular and ulcer secondary to ischemia Acute blood loss and iron deficiency anemia  Patient underwent coil embolization of the distal branch of SMA on 09/06/2021.  Patient also had endoscopic capsule done.  Had multiple blood transfusion during hospitalization.  Has been started on low-dose aspirin 09/23/2021.  Continue Protonix twice daily   Acute emphysematous E. coli cholecystitis  Status post percutaneous cholecystostomy drain placement on 09/07/2021. Completed 7-day course of ceftriaxone.  Follow-up with surgery and IR as outpatient.   AKI -09/12/21 CRRT, AKI has improved.  Nephrology has signed off.  Continue to monitor renal function.  Latest creatinine of 0.6   Hypernatremia:   Free water increased to 400 mL Q6 on 09/26/2021.   Latest sodium of 141.  Improved.  DM type II uncontrolled with Hyperglycemia  hemoglobin A1c= 6.2.  On Semglee 35 units twice daily, sliding scale insulin to moderate scale since off steroids.     Essential HTN Continue amlodipine   Pulmonary nodules in bilateral bases Never smoker.   Obesity (BMI 36.84 kg/m.) Present on admission.  Currently on cortrak tube tube feeding.  Status post hernia repair with mesh 2011.  Question PEG tube due to surgical history/pending mental improvement.    DVT prophylaxis: Place and maintain sequential compression device Start: 09/26/21 1759   Code Status:     Code Status: Full Code  Disposition: Uncertain at this time  Status is: Inpatient  Remains  inpatient appropriate because: Multiple issues- tube feeding, pending clinical improvement,  status post tracheostomy, neurology follow-up, possible need for PLEX therapy   Family Communication:  I again spoke with the patient's wife at bedside on 09/30/2021.  Consultants:  General surgery Neurology Critical care  Procedures:  PRBC transfusion CRRT NG tube placement Coil embolization EGD/colonoscopy/small bowel enteroscopy Intubation and mechanical ventilation Tracheostomy placement Percutaneous cholecystostomy tube placement  Antimicrobials:  None currently  Subjective: Today, patient was seen and examined at bedside.  Not much change is noted in his mentation.  Nonverbal.  Opens eyes spontaneously.  Moves extremities spontaneously.    Objective: Vitals:   09/30/21 0828 09/30/21 1056 09/30/21 1057 09/30/21 1442  BP: 136/71     Pulse: (!) 110 (!) 109 (!) 109 (!) 107  Resp:  (!) 22  (!) 24  Temp: 98.1 F (36.7 C)     TempSrc: Oral     SpO2: 98% 98%  98%  Weight:      Height:        Intake/Output Summary (Last 24 hours) at 09/30/2021 1453 Last data filed at 09/30/2021 1540 Gross per 24 hour  Intake 854.95 ml  Output 2335 ml  Net -1480.05 ml    Filed Weights   09/26/21 0500 09/27/21 0500 09/30/21 0500  Weight: 123.2 kg 123.5 kg 126.1 kg    Physical Examination: Body mass index is 37.7 kg/m.   General: Obese built, not in obvious distress, not following commands, nonverbal HENT:   No scleral pallor or icterus noted. Oral mucosa is moist.  Cortrak tube tube in place Chest:  Clear breath sounds.   No crackles or wheezes.  CVS: S1 &S2 heard. No murmur.  Regular rate and rhythm. Abdomen: Soft, nontender, nondistended.  Bowel sounds are heard.  Right upper quadrant cholecystostomy tube in place.  Foley catheter in place Extremities: No cyanosis, clubbing or edema.  Peripheral pulses are palpable. Psych: Awake but does not follow commands, moves extremities CNS:  Awake, nonverbal, does not follow commands, Skin: Warm and dry.     Data Reviewed:   CBC: Recent Labs  Lab 09/26/21 0335 09/27/21 0300 09/28/21 0440 09/29/21 0426 09/30/21 0330  WBC 13.6* 15.5* 14.7* 13.8* 11.8*  NEUTROABS 12.4* 14.3* 13.4* 11.2* 9.4*  HGB 11.8* 12.0* 11.9* 11.5* 10.9*  HCT 38.7* 38.7* 38.9* 36.6* 34.4*  MCV 93.0 91.9 93.1 90.1 89.4  PLT 268 252 238 205 155     Basic Metabolic Panel: Recent Labs  Lab 09/26/21 1215 09/27/21 0300 09/28/21 0440 09/29/21 0426 09/30/21 0330  NA 150* 153* 151* 144 141  K 4.0 3.8 4.1 3.8 3.7  CL 116* 120* 114* 107 106  CO2 24 27 29 29 29   GLUCOSE 242* 218* 181* 192* 161*  BUN 36* 37* 35* 30* 21*  CREATININE 0.87 0.86 0.78 0.72 0.63  CALCIUM 8.8* 8.8* 8.6* 8.5* 8.0*  MG 2.5* 2.6* 2.8* 2.6* 2.3  PHOS 3.7 3.8 4.3 3.6 3.6     Liver Function Tests: Recent Labs  Lab 09/26/21 1215 09/27/21 0300 09/28/21 0440 09/29/21 0426 09/30/21 0330  AST 29 22 25 27 23   ALT 40 40 37 39 34  ALKPHOS 100 93 89 87 86  BILITOT 0.6 0.4 0.5 0.5 0.7  PROT 6.4* 6.2* 5.8* 5.4* 5.1*  ALBUMIN 2.5* 2.4* 2.4* 2.2* 1.9*      Radiology Studies: CT ANGIO HEAD NECK W WO CM  Result Date: 09/30/2021 CLINICAL DATA:  Demyelinating disease. Abnormal MRI head. Rule out vasculitis. EXAM: CT ANGIOGRAPHY HEAD AND NECK TECHNIQUE: Multidetector CT imaging of the head and neck was performed using the standard protocol during bolus administration of intravenous contrast. Multiplanar CT image reconstructions and MIPs were obtained to evaluate the vascular anatomy. Carotid stenosis measurements (when applicable) are obtained utilizing NASCET criteria, using the distal internal carotid diameter as the denominator. RADIATION DOSE REDUCTION: This exam was performed according to the departmental dose-optimization program which includes automated exposure control, adjustment of the mA and/or kV according to patient size and/or use of iterative reconstruction  technique. CONTRAST:  24mL OMNIPAQUE IOHEXOL 350 MG/ML SOLN COMPARISON:  MRI head 09/20/2021 and 09/21/2021.  CT head 09/12/2021 FINDINGS: CT HEAD FINDINGS Brain: Motion degraded study. Multiple attempts were made however the patient was not able to hold still. Widespread white matter hypodensities throughout both cerebral hemispheres as noted on recent MRI and CT. These appear unchanged. Ventricle size normal. No midline shift. No acute hemorrhage. Vascular: Normal arterial flow voids Skull: 1 cm lytic lesion left frontal bone. This is unchanged from prior CT 04/28/2021. This lesion appears to have fat content on MRI, suggesting hemangioma. Sinuses/Orbits: Paranasal sinuses clear.  No orbital mass Other: None Review of the MIP images confirms the above findings CTA NECK FINDINGS Aortic arch: Aortic arch not included in the study. Proximal great vessels patent. Tracheostomy in good position. NG tube in the esophagus. Right carotid system: Right carotid widely patent. Minimal atherosclerotic calcification right carotid bifurcation. Left carotid system: Calcified and noncalcified plaque proximal left internal carotid artery with 25% diameter stenosis. No other left carotid stenosis. Vertebral arteries: Both vertebral arteries patent to the skull base without stenosis. Skeleton: No acute abnormality Other  neck: 11 mm hypodense left thyroid nodule. No adenopathy in the neck. No further imaging necessary for the thyroid nodule. (Ref: J Am Coll Radiol. 2015 Feb;12(2): 143-50). Upper chest: Lung apices clear bilaterally. Review of the MIP images confirms the above findings CTA HEAD FINDINGS Anterior circulation: Internal carotid artery widely patent to the skull base and cavernous segments. No stenosis or aneurysm. Anterior and middle cerebral arteries widely patent. No stenosis or aneurysm. No vessel irregularity or vasculitis identified. Posterior circulation: Both vertebral arteries patent to the basilar without  stenosis. PICA, AICA widely patent. Basilar widely patent. Superior cerebellar and posterior cerebral arteries normal. No stenosis or irregularity identified. No aneurysm. Venous sinuses: Normal venous enhancement Anatomic variants: None Review of the MIP images confirms the above findings IMPRESSION: 1. CT head is degraded by motion. Extensive white matter disease is present similar to recent CT and MRI. 2. 1 cm lytic lesion left frontal bone unchanged from prior CT. On recent MRI, this has questionable fat content suggestive of hemangioma. However this is not definite and follow-up recommended. 3. No intracranial stenosis or large vessel occlusion. No evidence of vasculitis. 4. Right carotid widely patent. 25% diameter stenosis proximal left internal carotid artery due to atherosclerotic disease. Electronically Signed   By: Franchot Gallo M.D.   On: 09/30/2021 11:35      LOS: 26 days    Flora Lipps, MD [facility] Available via Epic secure chat 7am-7pm After these hours, please refer to coverage provider listed on [email or web address] 09/30/2021, 2:53 PM

## 2021-09-30 NOTE — Progress Notes (Signed)
Received a call from CT that pt was not able to remain still for the exams. I called ordering neurologist and he placed a one time order for 1mg  IV Ativan. CT tech made aware and handed the message along to the bedside RN who is in CT with the patient. Murna Backer, Rande Brunt, RN

## 2021-09-30 NOTE — Progress Notes (Signed)
PT Cancellation Note  Patient Details Name: Troy Randall MRN: [id number] DOB: Oct 23, 1960   Cancelled Treatment:    Reason Eval/Treat Not Completed: Patient's level of consciousness Pt received Ativan yesterday for MRI and this morning for CT.  Wife reports generally takes pt at least a day to recover from sedation.  Pt not responding to verbal, tactile, noxious stimuli or wet wash rag to face.  Will f/u when pt more alert. [place] [phone]   Karlton Lemon 09/30/2021, 12:19 PM

## 2021-09-30 NOTE — Progress Notes (Signed)
Brief Neuro Update:  Waiting on the images to be completed. He got ativan way before his MRI and thus was unable to tolerate MRI per primary team. Planning on repeat imaging today. I did not evaluate the patient but did speak to his wife at bedside and the hospitalist team over phone. Nop acute change today. Will make a decision on PLEX after imaging.  [place] Pager Number [phone]

## 2021-09-30 NOTE — Progress Notes (Signed)
OT Cancellation Note  Patient Details Name: Troy Randall MRN: [id number] DOB: Feb 19, 1960   Cancelled Treatment:    Reason Eval/Treat Not Completed: Fatigue/lethargy limiting ability to participate. Pt received Ativan earlier this morning prior to imaging. Plan to reattempt at a later date.   Troy Randall, OT Acute Rehabilitation Services Office: [phone]   Troy Randall 09/30/2021, 12:18 PM

## 2021-09-30 NOTE — Progress Notes (Addendum)
Bladder scan indicates 381 ml of urine in the bladder, pt unable to urinate and fallow commands.  Olena Heckle, NP notified.  Pt does not appear to be uncomfortable.  Pt has external catheter and at this time he urinated 455ml of urine. Bladder scan indicated 0 ml of urine in bladder. Will continue to monitor pt.

## 2021-10-01 DIAGNOSIS — G9340 Encephalopathy, unspecified: Secondary | ICD-10-CM | POA: Diagnosis not present

## 2021-10-01 DIAGNOSIS — G9341 Metabolic encephalopathy: Secondary | ICD-10-CM | POA: Diagnosis not present

## 2021-10-01 DIAGNOSIS — J9611 Chronic respiratory failure with hypoxia: Secondary | ICD-10-CM | POA: Diagnosis not present

## 2021-10-01 DIAGNOSIS — Z930 Tracheostomy status: Secondary | ICD-10-CM | POA: Diagnosis not present

## 2021-10-01 LAB — GLUCOSE, CAPILLARY
Glucose-Capillary: 121 mg/dL — ABNORMAL HIGH (ref 70–99)
Glucose-Capillary: 128 mg/dL — ABNORMAL HIGH (ref 70–99)
Glucose-Capillary: 129 mg/dL — ABNORMAL HIGH (ref 70–99)
Glucose-Capillary: 146 mg/dL — ABNORMAL HIGH (ref 70–99)
Glucose-Capillary: 161 mg/dL — ABNORMAL HIGH (ref 70–99)
Glucose-Capillary: 177 mg/dL — ABNORMAL HIGH (ref 70–99)
Glucose-Capillary: 183 mg/dL — ABNORMAL HIGH (ref 70–99)
Glucose-Capillary: 215 mg/dL — ABNORMAL HIGH (ref 70–99)

## 2021-10-01 LAB — COMPREHENSIVE METABOLIC PANEL
ALT: 31 U/L (ref 0–44)
AST: 24 U/L (ref 15–41)
Albumin: 1.9 g/dL — ABNORMAL LOW (ref 3.5–5.0)
Alkaline Phosphatase: 85 U/L (ref 38–126)
Anion gap: 7 (ref 5–15)
BUN: 21 mg/dL — ABNORMAL HIGH (ref 6–20)
CO2: 28 mmol/L (ref 22–32)
Calcium: 8.2 mg/dL — ABNORMAL LOW (ref 8.9–10.3)
Chloride: 107 mmol/L (ref 98–111)
Creatinine, Ser: 0.65 mg/dL (ref 0.61–1.24)
GFR, Estimated: >60 mL/min (ref >60)
Glucose, Bld: 139 mg/dL — ABNORMAL HIGH (ref 70–99)
Potassium: 3.7 mmol/L (ref 3.5–5.1)
Sodium: 142 mmol/L (ref 135–145)
Total Bilirubin: 0.5 mg/dL (ref 0.3–1.2)
Total Protein: 5.2 g/dL — ABNORMAL LOW (ref 6.5–8.1)

## 2021-10-01 LAB — CBC WITH DIFFERENTIAL/PLATELET
Abs Immature Granulocytes: 0.35 10*3/uL — ABNORMAL HIGH (ref 0.00–0.07)
Basophils Absolute: 0 10*3/uL (ref 0.0–0.1)
Basophils Relative: 0 %
Eosinophils Absolute: 0.5 10*3/uL (ref 0.0–0.5)
Eosinophils Relative: 4 %
HCT: 34 % — ABNORMAL LOW (ref 39.0–52.0)
Hemoglobin: 10.7 g/dL — ABNORMAL LOW (ref 13.0–17.0)
Immature Granulocytes: 3 %
Lymphocytes Relative: 12 %
Lymphs Abs: 1.5 10*3/uL (ref 0.7–4.0)
MCH: 28.3 pg (ref 26.0–34.0)
MCHC: 31.5 g/dL (ref 30.0–36.0)
MCV: 89.9 fL (ref 80.0–100.0)
Monocytes Absolute: 0.7 10*3/uL (ref 0.1–1.0)
Monocytes Relative: 6 %
Neutro Abs: 9.4 10*3/uL — ABNORMAL HIGH (ref 1.7–7.7)
Neutrophils Relative %: 75 %
Platelets: 149 10*3/uL — ABNORMAL LOW (ref 150–400)
RBC: 3.78 MIL/uL — ABNORMAL LOW (ref 4.22–5.81)
RDW: 14.6 % (ref 11.5–15.5)
WBC: 12.5 10*3/uL — ABNORMAL HIGH (ref 4.0–10.5)
nRBC: 0 % (ref 0.0–0.2)

## 2021-10-01 LAB — PHOSPHORUS: Phosphorus: 3.2 mg/dL (ref 2.5–4.6)

## 2021-10-01 LAB — MAGNESIUM: Magnesium: 2.2 mg/dL (ref 1.7–2.4)

## 2021-10-01 MED ORDER — INSULIN GLARGINE-YFGN 100 UNIT/ML ~~LOC~~ SOLN
40.0000 [IU] | Freq: Two times a day (BID) | SUBCUTANEOUS | Status: DC
  Administered 2021-10-01 – 2021-10-02 (×2): 40 [IU] via SUBCUTANEOUS
  Filled 2021-10-01 (×3): qty 0.4

## 2021-10-01 NOTE — Progress Notes (Signed)
Neurology Progress Note  Subjective: Nods today and purposeful movement. Still no speech  Objective: Current vital signs: BP (!) 142/60   Pulse (!) (P) 103   Temp 98.1 F (36.7 C) (Oral)   Resp (P) 17   Ht 6' (1.829 m)   Wt 125.4 kg   SpO2 100%   BMI 37.49 kg/m  Vital signs in last 24 hours: Temp:  [98.1 F (36.7 C)-99.3 F (37.4 C)] 98.1 F (36.7 C) (09/27 0809) Pulse Rate:  [52-114] (P) 103 (09/27 0900) Resp:  [17-24] (P) 17 (09/27 0900) BP: (95-142)/(60-76) 142/60 (09/27 0809) SpO2:  [93 %-100 %] 100 % (09/27 0809) FiO2 (%):  [28 %] 28 % (09/27 0615) Weight:  [125.4 kg] 125.4 kg (09/27 0500)  Intake/Output from previous day: 09/26 0701 - 09/27 0700 In: -  Out: 160 [Drains:160] Intake/Output this shift: No intake/output data recorded. Nutritional status:  Diet Order             Diet NPO time specified  Diet effective now                  HEENT: [place]/AT. Trach collar is noted with bloody secretions noted. Ext: No edema   Neurologic Exam: Ment: Awake with eyes half-open, left more so than right.  Makes eye contact.  Still with no attempts to speak. Able to move all extremities purposefully but does not move them to command.  Nods head. CN: PERRL. Fixates briefly. Eyes are conjugate. Will intermittently track over a limited range of EOM. Face is symmetric.  Motor/Sensory: Spontaneous movement of the left upper extremity greater than the right upper extremity.  Lateral lower extremities move spontaneously without gravity.  Withdraws to noxious stimuli briskly throughout. Cerebellar/Gait: Unable to assess  Lab Results: Results for orders placed or performed during the hospital encounter of 09/04/21 (from the past 48 hour(s))  Glucose, capillary     Status: Abnormal   Collection Time: 09/29/21  4:38 PM  Result Value Ref Range   Glucose-Capillary 193 (H) 70 - 99 mg/dL    Comment: Glucose reference range applies only to samples taken after fasting for at least 8  hours.  Glucose, capillary     Status: Abnormal   Collection Time: 09/29/21  7:39 PM  Result Value Ref Range   Glucose-Capillary 226 (H) 70 - 99 mg/dL    Comment: Glucose reference range applies only to samples taken after fasting for at least 8 hours.  Glucose, capillary     Status: Abnormal   Collection Time: 09/29/21 10:10 PM  Result Value Ref Range   Glucose-Capillary 218 (H) 70 - 99 mg/dL    Comment: Glucose reference range applies only to samples taken after fasting for at least 8 hours.  Glucose, capillary     Status: Abnormal   Collection Time: 09/29/21 11:14 PM  Result Value Ref Range   Glucose-Capillary 264 (H) 70 - 99 mg/dL    Comment: Glucose reference range applies only to samples taken after fasting for at least 8 hours.  Glucose, capillary     Status: Abnormal   Collection Time: 09/30/21  1:50 AM  Result Value Ref Range   Glucose-Capillary 200 (H) 70 - 99 mg/dL    Comment: Glucose reference range applies only to samples taken after fasting for at least 8 hours.  Comprehensive metabolic panel     Status: Abnormal   Collection Time: 09/30/21  3:30 AM  Result Value Ref Range   Sodium 141 135 - 145 mmol/L  Potassium 3.7 3.5 - 5.1 mmol/L   Chloride 106 98 - 111 mmol/L   CO2 29 22 - 32 mmol/L   Glucose, Bld 161 (H) 70 - 99 mg/dL    Comment: Glucose reference range applies only to samples taken after fasting for at least 8 hours.   BUN 21 (H) 6 - 20 mg/dL   Creatinine, Ser 7.34 0.61 - 1.24 mg/dL   Calcium 8.0 (L) 8.9 - 10.3 mg/dL   Total Protein 5.1 (L) 6.5 - 8.1 g/dL   Albumin 1.9 (L) 3.5 - 5.0 g/dL   AST 23 15 - 41 U/L   ALT 34 0 - 44 U/L   Alkaline Phosphatase 86 38 - 126 U/L   Total Bilirubin 0.7 0.3 - 1.2 mg/dL   GFR, Estimated >28 >76 mL/min    Comment: (NOTE) Calculated using the CKD-EPI Creatinine Equation (2021)    Anion gap 6 5 - 15    Comment: Performed at [facility], [address], [place], [place] [address]  Magnesium     Status: None    Collection Time: 09/30/21  3:30 AM  Result Value Ref Range   Magnesium 2.3 1.7 - 2.4 mg/dL    Comment: Performed at [facility], [address] [place], [place] [address]  Phosphorus     Status: None   Collection Time: 09/30/21  3:30 AM  Result Value Ref Range   Phosphorus 3.6 2.5 - 4.6 mg/dL    Comment: Performed at [facility], [address] [place], [place] [address]  CBC with Differential/Platelet     Status: Abnormal   Collection Time: 09/30/21  3:30 AM  Result Value Ref Range   WBC 11.8 (H) 4.0 - 10.5 K/uL   RBC 3.85 (L) 4.22 - 5.81 MIL/uL   Hemoglobin 10.9 (L) 13.0 - 17.0 g/dL   HCT 16.3 (L) 84.5 - 36.4 %   MCV 89.4 80.0 - 100.0 fL   MCH 28.3 26.0 - 34.0 pg   MCHC 31.7 30.0 - 36.0 g/dL   RDW 68.0 32.1 - 22.4 %   Platelets 155 150 - 400 K/uL   nRBC 0.0 0.0 - 0.2 %   Neutrophils Relative % 79 %   Neutro Abs 9.4 (H) 1.7 - 7.7 K/uL   Lymphocytes Relative 10 %   Lymphs Abs 1.2 0.7 - 4.0 K/uL   Monocytes Relative 6 %   Monocytes Absolute 0.7 0.1 - 1.0 K/uL   Eosinophils Relative 3 %   Eosinophils Absolute 0.3 0.0 - 0.5 K/uL   Basophils Relative 0 %   Basophils Absolute 0.0 0.0 - 0.1 K/uL   Immature Granulocytes 2 %   Abs Immature Granulocytes 0.25 (H) 0.00 - 0.07 K/uL    Comment: Performed at [facility], [address] [place], [place] [address]  Glucose, capillary     Status: Abnormal   Collection Time: 09/30/21  3:59 AM  Result Value Ref Range   Glucose-Capillary 172 (H) 70 - 99 mg/dL    Comment: Glucose reference range applies only to samples taken after fasting for at least 8 hours.  Glucose, capillary     Status: Abnormal   Collection Time: 09/30/21  6:25 AM  Result Value Ref Range   Glucose-Capillary 158 (H) 70 - 99 mg/dL    Comment: Glucose reference range applies only to samples taken after fasting for at least 8 hours.  Glucose, capillary     Status: Abnormal   Collection Time:  09/30/21  8:30 AM  Result Value Ref Range    Glucose-Capillary 145 (H) 70 - 99 mg/dL    Comment: Glucose reference range applies only to samples taken after fasting for at least 8 hours.  Glucose, capillary     Status: Abnormal   Collection Time: 09/30/21 11:21 AM  Result Value Ref Range   Glucose-Capillary 161 (H) 70 - 99 mg/dL    Comment: Glucose reference range applies only to samples taken after fasting for at least 8 hours.  Glucose, capillary     Status: Abnormal   Collection Time: 09/30/21  4:04 PM  Result Value Ref Range   Glucose-Capillary 155 (H) 70 - 99 mg/dL    Comment: Glucose reference range applies only to samples taken after fasting for at least 8 hours.  Glucose, capillary     Status: Abnormal   Collection Time: 09/30/21  9:13 PM  Result Value Ref Range   Glucose-Capillary 174 (H) 70 - 99 mg/dL    Comment: Glucose reference range applies only to samples taken after fasting for at least 8 hours.  Glucose, capillary     Status: Abnormal   Collection Time: 10/01/21 12:22 AM  Result Value Ref Range   Glucose-Capillary 129 (H) 70 - 99 mg/dL    Comment: Glucose reference range applies only to samples taken after fasting for at least 8 hours.  Glucose, capillary     Status: Abnormal   Collection Time: 10/01/21  2:58 AM  Result Value Ref Range   Glucose-Capillary 121 (H) 70 - 99 mg/dL    Comment: Glucose reference range applies only to samples taken after fasting for at least 8 hours.  Glucose, capillary     Status: Abnormal   Collection Time: 10/01/21  3:20 AM  Result Value Ref Range   Glucose-Capillary 128 (H) 70 - 99 mg/dL    Comment: Glucose reference range applies only to samples taken after fasting for at least 8 hours.  Comprehensive metabolic panel     Status: Abnormal   Collection Time: 10/01/21  3:59 AM  Result Value Ref Range   Sodium 142 135 - 145 mmol/L   Potassium 3.7 3.5 - 5.1 mmol/L   Chloride 107 98 - 111 mmol/L   CO2 28 22 - 32 mmol/L   Glucose, Bld 139 (H) 70 - 99 mg/dL    Comment: Glucose  reference range applies only to samples taken after fasting for at least 8 hours.   BUN 21 (H) 6 - 20 mg/dL   Creatinine, Ser 3.14 0.61 - 1.24 mg/dL   Calcium 8.2 (L) 8.9 - 10.3 mg/dL   Total Protein 5.2 (L) 6.5 - 8.1 g/dL   Albumin 1.9 (L) 3.5 - 5.0 g/dL   AST 24 15 - 41 U/L   ALT 31 0 - 44 U/L   Alkaline Phosphatase 85 38 - 126 U/L   Total Bilirubin 0.5 0.3 - 1.2 mg/dL   GFR, Estimated >97 >02 mL/min    Comment: (NOTE) Calculated using the CKD-EPI Creatinine Equation (2021)    Anion gap 7 5 - 15    Comment: Performed at [facility], [address], [place], [place] [address]  Magnesium     Status: None   Collection Time: 10/01/21  3:59 AM  Result Value Ref Range   Magnesium 2.2 1.7 - 2.4 mg/dL    Comment: Performed at [facility], [address], [place], [place] [address]  Phosphorus     Status: None  Collection Time: 10/01/21  3:59 AM  Result Value Ref Range   Phosphorus 3.2 2.5 - 4.6 mg/dL    Comment: Performed at [facility], [address], [place], [place] [address]  CBC with Differential/Platelet     Status: Abnormal   Collection Time: 10/01/21  3:59 AM  Result Value Ref Range   WBC 12.5 (H) 4.0 - 10.5 K/uL   RBC 3.78 (L) 4.22 - 5.81 MIL/uL   Hemoglobin 10.7 (L) 13.0 - 17.0 g/dL   HCT 95.6 (L) 38.7 - 56.4 %   MCV 89.9 80.0 - 100.0 fL   MCH 28.3 26.0 - 34.0 pg   MCHC 31.5 30.0 - 36.0 g/dL   RDW 33.2 95.1 - 88.4 %   Platelets 149 (L) 150 - 400 K/uL   nRBC 0.0 0.0 - 0.2 %   Neutrophils Relative % 75 %   Neutro Abs 9.4 (H) 1.7 - 7.7 K/uL   Lymphocytes Relative 12 %   Lymphs Abs 1.5 0.7 - 4.0 K/uL   Monocytes Relative 6 %   Monocytes Absolute 0.7 0.1 - 1.0 K/uL   Eosinophils Relative 4 %   Eosinophils Absolute 0.5 0.0 - 0.5 K/uL   Basophils Relative 0 %   Basophils Absolute 0.0 0.0 - 0.1 K/uL   Immature Granulocytes 3 %   Abs Immature Granulocytes 0.35 (H) 0.00 - 0.07 K/uL    Comment: Performed at [facility], [address]  [place], [place] [address]  Glucose, capillary     Status: Abnormal   Collection Time: 10/01/21  6:11 AM  Result Value Ref Range   Glucose-Capillary 161 (H) 70 - 99 mg/dL    Comment: Glucose reference range applies only to samples taken after fasting for at least 8 hours.  Glucose, capillary     Status: Abnormal   Collection Time: 10/01/21  8:10 AM  Result Value Ref Range   Glucose-Capillary 146 (H) 70 - 99 mg/dL    Comment: Glucose reference range applies only to samples taken after fasting for at least 8 hours.    Recent Results (from the past 240 hour(s))  CSF culture w Gram Stain     Status: None   Collection Time: 09/22/21  1:48 PM   Specimen: CSF; Cerebrospinal Fluid  Result Value Ref Range Status   Specimen Description CSF  Final   Special Requests NONE  Final   Gram Stain   Final    WBC PRESENT, PREDOMINANTLY MONONUCLEAR NO ORGANISMS SEEN CYTOSPIN SMEAR    Culture   Final    NO GROWTH 3 DAYS Performed at [facility], [address] [place], [place] [address]    Report Status 09/25/2021 FINAL  Final  Culture, fungus without smear     Status: None (Preliminary result)   Collection Time: 09/22/21  1:49 PM   Specimen: CSF; Cerebrospinal Fluid  Result Value Ref Range Status   Specimen Description CSF  Final   Special Requests NONE  Final   Culture   Final    NO FUNGUS ISOLATED AFTER 8 DAYS Performed at [facility], [address] [place], [place] [address]    Report Status PENDING  Incomplete  Anaerobic culture w Gram Stain     Status: None   Collection Time: 09/22/21  1:49 PM   Specimen: CSF; Cerebrospinal Fluid  Result Value Ref Range Status   Specimen Description CSF  Final   Special Requests NONE  Final   Gram Stain  NO WBC SEEN NO ORGANISMS SEEN   Final   Culture   Final    NO ANAEROBES ISOLATED Performed at [facility], [address], [place], [place] [address]    Report Status 09/27/2021 FINAL  Final    Lipid Panel No results for  input(s): "CHOL", "TRIG", "HDL", "CHOLHDL", "VLDL", "LDLCALC" in the last 72 hours.  Studies/Results: CT ANGIO HEAD NECK W WO CM  Result Date: 09/30/2021 CLINICAL DATA:  Demyelinating disease. Abnormal MRI head. Rule out vasculitis. EXAM: CT ANGIOGRAPHY HEAD AND NECK TECHNIQUE: Multidetector CT imaging of the head and neck was performed using the standard protocol during bolus administration of intravenous contrast. Multiplanar CT image reconstructions and MIPs were obtained to evaluate the vascular anatomy. Carotid stenosis measurements (when applicable) are obtained utilizing NASCET criteria, using the distal internal carotid diameter as the denominator. RADIATION DOSE REDUCTION: This exam was performed according to the departmental dose-optimization program which includes automated exposure control, adjustment of the mA and/or kV according to patient size and/or use of iterative reconstruction technique. CONTRAST:  70mL OMNIPAQUE IOHEXOL 350 MG/ML SOLN COMPARISON:  MRI head 09/20/2021 and 09/21/2021.  CT head 09/12/2021 FINDINGS: CT HEAD FINDINGS Brain: Motion degraded study. Multiple attempts were made however the patient was not able to hold still. Widespread white matter hypodensities throughout both cerebral hemispheres as noted on recent MRI and CT. These appear unchanged. Ventricle size normal. No midline shift. No acute hemorrhage. Vascular: Normal arterial flow voids Skull: 1 cm lytic lesion left frontal bone. This is unchanged from prior CT 04/28/2021. This lesion appears to have fat content on MRI, suggesting hemangioma. Sinuses/Orbits: Paranasal sinuses clear.  No orbital mass Other: None Review of the MIP images confirms the above findings CTA NECK FINDINGS Aortic arch: Aortic arch not included in the study. Proximal great vessels patent. Tracheostomy in good position. NG tube in the esophagus. Right carotid system: Right carotid widely patent. Minimal atherosclerotic calcification right carotid  bifurcation. Left carotid system: Calcified and noncalcified plaque proximal left internal carotid artery with 25% diameter stenosis. No other left carotid stenosis. Vertebral arteries: Both vertebral arteries patent to the skull base without stenosis. Skeleton: No acute abnormality Other neck: 11 mm hypodense left thyroid nodule. No adenopathy in the neck. No further imaging necessary for the thyroid nodule. (Ref: J Am Coll Radiol. 2015 Feb;12(2): 143-50). Upper chest: Lung apices clear bilaterally. Review of the MIP images confirms the above findings CTA HEAD FINDINGS Anterior circulation: Internal carotid artery widely patent to the skull base and cavernous segments. No stenosis or aneurysm. Anterior and middle cerebral arteries widely patent. No stenosis or aneurysm. No vessel irregularity or vasculitis identified. Posterior circulation: Both vertebral arteries patent to the basilar without stenosis. PICA, AICA widely patent. Basilar widely patent. Superior cerebellar and posterior cerebral arteries normal. No stenosis or irregularity identified. No aneurysm. Venous sinuses: Normal venous enhancement Anatomic variants: None Review of the MIP images confirms the above findings IMPRESSION: 1. CT head is degraded by motion. Extensive white matter disease is present similar to recent CT and MRI. 2. 1 cm lytic lesion left frontal bone unchanged from prior CT. On recent MRI, this has questionable fat content suggestive of hemangioma. However this is not definite and follow-up recommended. 3. No intracranial stenosis or large vessel occlusion. No evidence of vasculitis. 4. Right carotid widely patent. 25% diameter stenosis proximal left internal carotid artery due to atherosclerotic disease. Electronically Signed   By: Marlan Palau M.D.   On: 09/30/2021 11:35  Medications: Scheduled:  amLODipine  10 mg Per Tube Daily   aspirin  81 mg Per Tube Daily   Chlorhexidine Gluconate Cloth  6 each Topical Daily    docusate  100 mg Per Tube Daily   feeding supplement (PROSource TF20)  60 mL Per Tube BID   free water  400 mL Per Tube Q6H   influenza vac split quadrivalent PF  0.5 mL Intramuscular Tomorrow-1000   insulin aspart  0-15 Units Subcutaneous Q4H   insulin aspart  0-5 Units Subcutaneous QHS   insulin glargine-yfgn  35 Units Subcutaneous BID   mouth rinse  15 mL Mouth Rinse Q2H   pantoprazole  40 mg Per Tube BID   pneumococcal 23 valent vaccine  0.5 mL Intramuscular Tomorrow-1000   polyethylene glycol  17 g Per Tube Daily   rosuvastatin  40 mg Per Tube Daily   sodium chloride flush  10-40 mL Intracatheter Q12H   sodium chloride flush  5 mL Intracatheter Q8H   Zinc Oxide   Topical BID   Continuous:  sodium chloride Stopped (09/15/21 1519)   sodium chloride     dextrose     feeding supplement (VITAL 1.5 CAL) 1,000 mL (10/01/21 0249)   lactated ringers     Assessment: 61 year old patient with history of stroke in April of this year with minimal residual deficits, HTN, HLD, DM and recent jejunal bleed, presented with continued AMS and concerning multifocal enhancing white matter lesions on MRI that exhibit morphologies and a distribution suggestive of MS or ADEM, with vasculitis also a consideration, but lower on the differential diagnosis. He has no prior history of demyelinating disease and wife states no prior episodes of focal weakness or vision loss. Also with no family history of MS. Spine imaging negative for cord lesions. Has completed a 5 day course of IV Solumedrol as empiric treatment for the lesions without improvement in presentation. - Serial exam findings: - Exam today: Unchanged.  Does not follow commands.  Spontaneous antigravity movements of left > right upper extremity.  Bilateral lower extremities move equally without gravity.  Does not attempt to speak.  Per wife at bedside, patient remains significantly altered from baseline. - Imaging: - White matter changes on MRI with  multiple foci of bland restricted diffusion may represent strokes, although pattern would be unusual. The locations and morphologies of the lesions could be consistent with demyelinating disease, but the enhancing pattern would be atypical. Infectious etiology unlikely as there is no perilesional edema or necrotic core associated with the lesions. - MRI brain follow up post-contrast imaging reveals >20 contrast enhancing lesions in the white matter bilaterally. Strongly favor inflammation / active demyelination possibly MS vs ADEM on initial interpretation. Based on personal review of the images by they seem more complatible with ADEM or a vasculitic process than with MS. Sequela of septic shock is suspected, but I could not find any literature with specific brain imaging to support or refute this component of the DDx.  - MRI of cervical and thoracic spine w/wo contrast: Markedly limited exam for assessment of the spinal cord due to the degree of motion artifact. Within this limitation, there is no definite evidence of active demyelination. Within limitations of motion artifact, there may be demyelinating lesions at the C6 and T1 vertebral body levels. Repeat exam is recommended if more definitive characterization as clinically warranted. - Inflammation favored over infection given that he has been afebrile and primarily altered but not ill-appearing therefore no indication for  empiric CNS coverage at this time.  - LP was performed by C[facility] on 9/18. CSF findings: - Glucose 126, RBC count 1-5 (unremarkable), clear, colorless, total protein normal, WBC normal.  - Gram stain: Negative - Fungal and bacterial cultures pending - Infectious disease Biofire panel negative for all pathogens tested, including cryptococcus, VZV and HSV.  - Cryptococcal Ag negative.  - IgG index is normal at 0.5.  - OCBs testing has resulted: No oligoclonal bands seen - Serum labs:  - Vitamin B12 level consistent with  supplementation. Mildly elevated Na. BUN 30. ALT 48. TSH normal. ANA negative.  - C3 complement level elevated at 184. Autoimmune panel otherwise negative.  - HIV screen negative.  - DDx for AMS: May be secondary to a CNS inflammatory process as evidenced by the enhancing lesions seen on brain MRI. Inflammation may not be restricted only to the regions of enhancement visible on MRI. Sequelae of end organ damage from his septic shock is also on the DDx (see footnote below). 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  - DDx for white matter lesions on MRI: ADEM, multiple sclerosis and cerebral vasculitis (isolated or as a component of a possible systemic vasculitis) are felt to be highest on the DDx. He did have matched bands on testing for oligoclonal bands. Which suggests peripheral antibody production but could also be related to his recent cholecystitis. - Assessment of response to 5 day course of pulsed-dose steroids (completed on Saturday 9/23): Mild improvement gradually over the 5 day period but he still remains significantly altered from his baseline.   Recommendations: - OOB to chair when possible to reduce the risk of developing an ICU neuropathy or myopathy - Repeat MRI brain, cervical, thoracic spine w/wo contrast. - CT angio head and neck not concerning for LVO or vasculitis. - Follow up neuroimaging as it is complete - Discussed PLEX therapy with patient's wife at bedside. There is some improvement on exam on 9/27 compared to 2 days ago so will try to wait to attempt MRIs. If does not show consistent improvement, will do PLEX x 5 sessions.    LOS: 27 days  -- Erick Blinks [facility] Pager Number [phone]

## 2021-10-01 NOTE — Progress Notes (Signed)
Nutrition Follow-up  DOCUMENTATION CODES:   Obesity unspecified  INTERVENTION:  Continue TF via postpyloric Cortrak: Vital 1.5 goal rate of 60 ml/h (1440 ml per day) Prosource TF20 60 ml BID  Provides 2320 kcal, 137 gm protein, 1003 ml free water daily  NUTRITION DIAGNOSIS:   Increased nutrient needs related to acute illness as evidenced by estimated needs.  GOAL:   Patient will meet greater than or equal to 90% of their needs - Met: Tolerating TF at goal rate.   MONITOR:   TF tolerance  REASON FOR ASSESSMENT:   Consult Enteral/tube feeding initiation and management  ASSESSMENT:   Pt with PMH of HTN, HLD, DM, CVA on ASA/plavix admitted 8/31 from [facility] due to gi bleed.  Pt was sleeping at time of assessment. Pt is currently receiving TF at goal rate and infusing as ordered. Spoke with RN who states that the pt is tolerating TF well with no issues. RD will continue to monitor TF tolerance.   NUTRITION - FOCUSED PHYSICAL EXAM:  Flowsheet Row Most Recent Value  Orbital Region No depletion  Upper Arm Region No depletion  Thoracic and Lumbar Region No depletion  Buccal Region No depletion  Temple Region No depletion  Clavicle Bone Region No depletion  Clavicle and Acromion Bone Region No depletion  Scapular Bone Region No depletion  Dorsal Hand No depletion  Patellar Region No depletion  Anterior Thigh Region No depletion  Posterior Calf Region No depletion  Edema (RD Assessment) Severe  Hair Reviewed  Eyes Unable to assess  Mouth Unable to assess  Skin Reviewed  Nails Reviewed       Diet Order:   Diet Order             Diet NPO time specified  Diet effective now                   EDUCATION NEEDS:   No education needs have been identified at this time  Skin:  Skin Assessment: Reviewed RN Assessment (MASD to rectum)  Last BM:  09/29/21  Height:   Ht Readings from Last 1 Encounters:  09/11/21 6' (1.829 m)    Weight:   Wt Readings from  Last 1 Encounters:  10/01/21 125.4 kg    Ideal Body Weight:  80.9 kg  BMI:  Body mass index is 37.49 kg/m.  Estimated Nutritional Needs:   Kcal:  2200-2500  Protein:  130-150 grams  Fluid:  >2 L/day  Troy Randall, RD, LDN, CNSC.

## 2021-10-01 NOTE — Progress Notes (Signed)
[facility] Progress Note  Patient: Troy Randall     JGO:[id number]  DOA: 09/04/2021   PCP: [name], MD       Brief hospital course:   Patient is a 61 years old male with medical history of hypertension, hyperlipidemia, type 2 diabetes, CVA, presented to the hospital with GI bleed.  Patient underwent IR embolization followed by percutaneous cholecystostomy tube placement due to ongoing altered mental status.  MRI of the brain showed numerous contrast-enhancing bilateral white matter lesions concerning for myelinating disease.  Neurology was consulted and underwent lumbar puncture on 09/22/2021 with low suspicion for infectious etiology.  Neurology has initiated the patient on high-dose steroids on 09/23/2021.  Patient was then transferred to hospitalist service on 09/24/2021.    Significant events. 8/31 presented to [facility] ED, [name] Admit, Gi Consult 9/1 CTA> no evidence of active GI bleed, 2 U PRBC 9/2 2 U PRBC, colonoscopy > no active bleed found but large clots throughout entire colon. EGD> small hiatal hernia, erosive gastropathy with no stigmata of recent bleeding, and non-bleeding duodenal diverticulum.   Underwent then small bowel enteroscopy which noted intermittent blood pumping in the proximal jejunum but was unable to be reached despite multiple attempts, was injected and clip placed. Repeat CTA repeated, which showed positive jejunal diverticular bleeding. IR> active extrav at [facility] territory, jejunal arcade branch, successful coil embo 9/3 Intubated. CTA> no evidence of active GI bleeding.  New finding of abnormal gallbladder with nondependent air, gallbladder wall air, pericholecystic and right upper quadrant information.  IR placed percutaneous cholecystotomy tube.  9/4 R. IJ central line placed with dialysis triple lumen catheter 9/5 CRRT initiated 9/6 Bedside EGD without any evidence of bleeding. CTA a/p without etiology for bleed 9/8 CRRT discontinued. Required ETT  exchange yesterday due to blown cuff. 9/11 Bronchoscopic guided percutaneous tracheostomy placement.  Right IJ central line removed. 9/15 flexible sigmoidoscopy with removal of endoscopy 9/16 MRI brain w/o contrast amended study secondary to range of motion.  Rapid development numerous round white matter lesions in both cerebral hemispheres.  Prior MRIs. 9/17 MRI brain: Numerous contrast-enhancing bilateral white matter lesions, consistent with active demyelination. 9/18 Lumbar puncture  9/19 MRI cervical spine, thoracic : Limited exam, within limitations there is not definitive evidence of active demyelinization. Within limitations of motion artifact, there may be demyelinating lesions at the C6 and T1 vertebral body levels. -S/p  LP on 9/18. 9/25-clinical exam not improved despite completion of 5-day IV steroids.  Neurology planning to repeat MRI of the brain cervical spine thoracic spine. Possible IR consult for catheter placement for possible Plex therapy if abnormal scans 9/26- scans attempted with ativan      Subjective:  Non verbal  Assessment and Plan: Principal Problem:   Acute metabolic encephalopathy - appreciate neurology assistance - high dose IV steroids given for 5 days for possible MS - repeating MRI  Active Problems: Tracheostomy 9/11 - cont trach collar  Hemorrhagic shock with acute blood loss anemia secondary to lower GI bleed Iron deficiency anemia - 9/2 coil embolization of distal branch of SMA - On Protonix twice daily and baby aspirin daily (9/19)  Acute emphysematous E. coli cholecystitis - 9/3-percutaneous cholecystostomy drain placed - Completed 7 days of ceftriaxone  AKI - Status post CRRT that was discontinued on 9/8  Hypernatremia - Continue free water at 400 cc every 6 hours     Diabetes mellitus type 2 - receiving Semglee and Novolog sliding scale insulin - increase Semglee to 40  U BID in attempt to reduce Novolog use - Receiving core track  tube feeds    Essential hypertension -cont Amlodipine     Morbid Obesity Body mass index is 37.49 kg/m.   DVT prophylaxis:  Place and maintain sequential compression device Start: 09/26/21 1759     Code Status: Full Code  Consultants: Neurology, GI, PCCM, general surgery Level of Care: Level of care: Progressive  Objective:   Vitals:   10/01/21 0615 10/01/21 0809 10/01/21 0900 10/01/21 1242  BP:  (!) 142/60    Pulse:  (!) 52 (!) 103 (!) 104  Resp:   17 (!) 24  Temp:  98.1 F (36.7 C)    TempSrc:  Oral    SpO2: 100% 100%  99%  Weight:      Height:       Filed Weights   09/27/21 0500 09/30/21 0500 10/01/21 0500  Weight: 123.5 kg 126.1 kg 125.4 kg   Exam: General exam: Appears comfortable - lethargic- does not follow commands or communicate  HEENT: trach present Respiratory system: Clear to auscultation.  Cardiovascular system: S1 & S2 heard  Gastrointestinal system: Abdomen soft, non-tender, nondistended. Normal bowel sounds  - drain present in RUQ with brown liquid in bag Extremities: No cyanosis, clubbing or edema    Imaging and lab data was personally reviewed    CBC: Recent Labs  Lab 09/27/21 0300 09/28/21 0440 09/29/21 0426 09/30/21 0330 10/01/21 0359  WBC 15.5* 14.7* 13.8* 11.8* 12.5*  NEUTROABS 14.3* 13.4* 11.2* 9.4* 9.4*  HGB 12.0* 11.9* 11.5* 10.9* 10.7*  HCT 38.7* 38.9* 36.6* 34.4* 34.0*  MCV 91.9 93.1 90.1 89.4 89.9  PLT 252 238 205 155 149*   Basic Metabolic Panel: Recent Labs  Lab 09/27/21 0300 09/28/21 0440 09/29/21 0426 09/30/21 0330 10/01/21 0359  NA 153* 151* 144 141 142  K 3.8 4.1 3.8 3.7 3.7  CL 120* 114* 107 106 107  CO2 27 29 29 29 28   GLUCOSE 218* 181* 192* 161* 139*  BUN 37* 35* 30* 21* 21*  CREATININE 0.86 0.78 0.72 0.63 0.65  CALCIUM 8.8* 8.6* 8.5* 8.0* 8.2*  MG 2.6* 2.8* 2.6* 2.3 2.2  PHOS 3.8 4.3 3.6 3.6 3.2   GFR: Estimated Creatinine Clearance: 136 mL/min (by C-G formula based on SCr of 0.65  mg/dL).  Scheduled Meds:  amLODipine  10 mg Per Tube Daily   aspirin  81 mg Per Tube Daily   Chlorhexidine Gluconate Cloth  6 each Topical Daily   docusate  100 mg Per Tube Daily   feeding supplement (PROSource TF20)  60 mL Per Tube BID   free water  400 mL Per Tube Q6H   influenza vac split quadrivalent PF  0.5 mL Intramuscular Tomorrow-1000   insulin aspart  0-15 Units Subcutaneous Q4H   insulin aspart  0-5 Units Subcutaneous QHS   insulin glargine-yfgn  35 Units Subcutaneous BID   mouth rinse  15 mL Mouth Rinse Q2H   pantoprazole  40 mg Per Tube BID   pneumococcal 23 valent vaccine  0.5 mL Intramuscular Tomorrow-1000   polyethylene glycol  17 g Per Tube Daily   rosuvastatin  40 mg Per Tube Daily   sodium chloride flush  10-40 mL Intracatheter Q12H   sodium chloride flush  5 mL Intracatheter Q8H   Zinc Oxide   Topical BID   Continuous Infusions:  sodium chloride Stopped (09/15/21 1519)   sodium chloride     dextrose     feeding supplement (VITAL 1.5  CAL) 1,000 mL (10/01/21 0249)   lactated ringers       LOS: 27 days   Author: Calvert Cantor  10/01/2021 2:48 PM

## 2021-10-01 NOTE — Progress Notes (Signed)
Speech Language Pathology Treatment: [name] Speaking valve  Patient Details Name: Troy Randall MRN: [id number] DOB: 13-Apr-1960 Today's Date: 10/01/2021 Time: 6568-1275 SLP Time Calculation (min) (ACUTE ONLY): 20 min  Assessment / Plan / Recommendation Clinical Impression  Patient seen by SLP for skilled treatment focused on PMV usage/toleration. His spouse was in room during session. Patient was awake and alert and had just completed PT/OT session working at bed-level. He continues to not be able to follow commands though he does track with his eyes at times. SLP performed oral care with patient's mouth open slightly at baseline, though he did not follow commands to open more. Patient was achieving some voicing around trach and one time did say "hey" when spouse was cueing him. Otherwise, little mouth movement and vocalizations with and without PMV in place were not cued and not meaningful. SLP placed PMV on for brief increments, never more than 1-2 minutes. Patient tolerated without any changes in vitals. He did exhibit two instances of cough when PMV in place, redirecting air through vocal cords when doing so. No evidence of back pressure when PMV removed. He produced clear voicing spontaneously but only one time voiced/verbalized when cued. SLP continues to recommend PMV with SLP only. Spouse educated regarding current functional level. SLP will continue to follow.     HPI HPI: Pt is a 61 y.o. male with history of stroke in 4/23, HTN, HLD and DM with recent severe jejunal bleed s/p vessel coiling who is being seen for altered mental status and concerning white matter changes on MRI. Patient's wife reports that after his stroke in 4/23 he made a good recovery and was able to perform ADLs. He had some residual dysarthria. Pt [facility] to [facility] on [facility] with GI bleeding and has remained persistently altered. Intubated 9/3 and trach placed 9/11. He currently has #6 cuffed trach  tolerating cuff deflated.      SLP Plan  Continue with current plan of care      Recommendations for follow up therapy are one component of a multi-disciplinary discharge planning process, led by the attending physician.  Recommendations may be updated based on patient status, additional functional criteria and insurance authorization.    Recommendations         Patient may use Passy-Muir Speech Valve: with SLP only PMSV Supervision: Full MD: Please consider changing trach tube to : Cuffless         Oral Care Recommendations: Oral care QID;Staff/trained caregiver to provide oral care Follow Up Recommendations: SLP at Long-term acute care hospital Assistance recommended at discharge: Frequent or constant Supervision/Assistance SLP Visit Diagnosis: Aphonia (R49.1) Plan: Continue with current plan of care           Sonia Baller, MA, CCC-SLP Speech Therapy

## 2021-10-01 NOTE — TOC Progression Note (Addendum)
Transition of Care [facility]) - Progression Note    Patient Details  Name: HUNG RHINESMITH MRN: [id number] Date of Birth: [birth year]  Transition of Care [facility]) CM/SW Contact  Sharin Mons, RN Phone Number: 10/01/2021, 10:41 AM  Clinical Narrative:    Determination from LTAC  still pending  from insurance per [facility]/ Jaquelyn Bitter [phone]).  TOC team will continue to monitor and assist with needs...  11:21am  NCM received a call from Jaquelyn Bitter with [facility] informing NCM insurance auth, denied. Jaquelyn Bitter stated he will send appeal papers for MD to sign for appeal process.    Expected Discharge Plan: Long Term Acute Care (LTAC) Barriers to Discharge: Continued Medical Work up  Expected Discharge Plan and Services Expected Discharge Plan: [place] (LTAC)   Discharge Planning Services: CM Consult   Living arrangements for the past 2 months: Single Family Home                                       Social Determinants of Health (SDOH) Interventions    Readmission Risk Interventions     No data to display

## 2021-10-01 NOTE — Progress Notes (Signed)
Occupational Therapy Treatment Patient Details Name: Troy Randall MRN: [id number] DOB: [birth year] Today's Date: 10/01/2021   History of present illness Patient is a 61 y/o male who presents on 09/04/21 with dark stools, dizziness and SOB. Found to have GI bleed and acute anemia s/p embolization 09/06/21 and percutaneous cholecystostomy 09/07/21. Intubated 9/3, CRRT 9/5-9/8. Hemorrhagic and septic shock 9/5. Trach placed 9/11. 9/16 MRI rapid development numerous round white matter lesions in both cerebral hemispheres; 9/17 MRI: numerous white matter lesions consistent with active demyelination - concerning for MS, ADEM, and cerebral vasculitis; 9/18 Lumbar puncture. PMH includes CVA, DM, HTN.   OT comments  Pt restless in bed with supportive wife at bedside. Focus of session on bed mobility and sitting balance at EOB. Pt demonstrating improved awareness of midline in sitting and making effort to correct posture using B hands on chair in front of him. Pt raising R hand near end of session in supine and waving his hand. Following simple commands inconsistently with increased time. Updated OT goals.    Recommendations for follow up therapy are one component of a multi-disciplinary discharge planning process, led by the attending physician.  Recommendations may be updated based on patient status, additional functional criteria and insurance authorization.    Follow Up Recommendations  OT at Long-term acute care hospital    Assistance Recommended at Discharge Frequent or constant Supervision/Assistance  Patient can return home with the following  Two people to help with walking and/or transfers;Two people to help with bathing/dressing/bathroom;Direct supervision/assist for medications management;Direct supervision/assist for financial management;Assist for transportation;Help with stairs or ramp for entrance;Assistance with feeding;Assistance with cooking/housework   Equipment Recommendations  Other  (comment) (defer to next venue)    Recommendations for Other Services      Precautions / Restrictions Precautions Precautions: Fall Precaution Comments: trach collar, cortrak       Mobility Bed Mobility Overal bed mobility: Needs Assistance Bed Mobility: Supine to Sit, Sit to Supine     Supine to sit: +2 for physical assistance, Total assist Sit to supine: +2 for physical assistance, Total assist   General bed mobility comments: assist for all aspects    Transfers                         Balance Overall balance assessment: Needs assistance Sitting-balance support: Feet supported, Bilateral upper extremity supported Sitting balance-Leahy Scale: Poor Sitting balance - Comments: pt attempting to correct posture from R lean to midline, demonstrated some pulling forward with hands on chair in front of pt Postural control: Right lateral lean, Posterior lean                                 ADL either performed or assessed with clinical judgement   ADL                                         General ADL Comments: dependent in ADLs, focused on bed mobility and sitting balance as a precursor    Extremity/Trunk Assessment Upper Extremity Assessment RUE Deficits / Details: reached to nose and maintained grasp on chair in front of him in sitting, propped on R arm in sitting, raised arm above head in supine and waved hand spontaneously LUE Deficits / Details: worked to keep L hand on chair placed  in front of him, weaker than R            Vision   Additional Comments: wife reports pt tracks her in the room, inconsistently responds to threat by blinking, inconsistently appearing to focus on face/visual target in environment   Perception     Praxis      Cognition Arousal/Alertness: Awake/alert Behavior During Therapy: Flat affect Overall Cognitive Status: Impaired/Different from baseline Area of Impairment: Following commands,  Safety/judgement, Attention                   Current Attention Level: Focused   Following Commands: Follows one step commands with increased time, Follows one step commands inconsistently Safety/Judgement: Decreased awareness of safety     General Comments: significant delay in following simple commands inconsistently        Exercises      Shoulder Instructions       General Comments      Pertinent Vitals/ Pain       Pain Assessment Pain Assessment: Faces Faces Pain Scale: No hurt  Home Living                                          Prior Functioning/Environment              Frequency  Min 2X/week        Progress Toward Goals  OT Goals(current goals can now be found in the care plan section)  Progress towards OT goals: Progressing toward goals  Acute Rehab OT Goals OT Goal Formulation: With family Time For Goal Achievement: 10/15/21 Potential to Achieve Goals: [place] Discharge plan remains appropriate    Co-evaluation    PT/OT/SLP Co-Evaluation/Treatment: Yes Reason for Co-Treatment: Complexity of the patient's impairments (multi-system involvement);For patient/therapist safety   OT goals addressed during session: Strengthening/ROM      AM-PAC OT "6 Clicks" Daily Activity     Outcome Measure   Help from another person eating meals?: Total Help from another person taking care of personal grooming?: Total Help from another person toileting, which includes using toliet, bedpan, or urinal?: Total Help from another person bathing (including washing, rinsing, drying)?: Total Help from another person to put on and taking off regular upper body clothing?: Total Help from another person to put on and taking off regular lower body clothing?: Total 6 Click Score: 6    End of Session Equipment Utilized During Treatment: Oxygen (5L 28%)  OT Visit Diagnosis: Muscle weakness (generalized) (M62.81);Other symptoms and signs  involving cognitive function;Low vision, both eyes (H54.2)   Activity Tolerance Patient tolerated treatment well   Patient Left in bed;with call bell/phone within reach;with family/visitor present;with bed alarm set   Nurse Communication          Time: 5501-5868 OT Time Calculation (min): 36 min  Charges: OT General Charges $OT Visit: 1 Visit OT Treatments $Therapeutic Activity: 8-22 mins  Cleta Alberts, OTR/L Acute Rehabilitation Services Office: [phone]   [name] 10/01/2021, 3:44 PM

## 2021-10-01 NOTE — Progress Notes (Signed)
This RN called MRI at 2230 and ask if they can complete scan since pt is still sedated from earlier today when he received Ativan 2 mg.  Pt transported to MRI around 2330, and 2 min after test was initiated pt became restless. MD on call contacted regarding additional sedation, but decision was made to consult neurologist in the morning. Pt returned to room.

## 2021-10-01 NOTE — Progress Notes (Signed)
Physical Therapy Treatment Patient Details Name: Troy Randall MRN: [id number] DOB: Dec 19, 1960 Today's Date: 10/01/2021   History of Present Illness Patient is a 61 y/o male who presents on 09/04/21 with dark stools, dizziness and SOB. Found to have GI bleed and acute anemia s/p embolization 09/06/21 and percutaneous cholecystostomy 09/07/21. Intubated 9/3, CRRT 9/5-9/8. Hemorrhagic and septic shock 9/5. Trach placed 9/11. 9/16 MRI rapid development numerous round white matter lesions in both cerebral hemispheres; 9/17 MRI: numerous white matter lesions consistent with active demyelination - concerning for MS, ADEM, and cerebral vasculitis; 9/18 Lumbar puncture. PMH includes CVA, DM, HTN.    PT Comments    Patient restless in bed on arrival with wife at bedside. Patient continues to require totalA+2 for bed mobility due to poor command following. Demonstrating improved awareness of midline in sitting with patient making effort to correct R lateral lean with use of B UE on chair in front of him and assist to maintain grip on chair. Able to follow "kick" command with significant delay and tactile cueing. Encouraged wife to sit on L side due to R rotation preference of head in supine and encouraged PROM of neck during the day. D/c plan remains appropriate.     Recommendations for follow up therapy are one component of a multi-disciplinary discharge planning process, led by the attending physician.  Recommendations may be updated based on patient status, additional functional criteria and insurance authorization.  Follow Up Recommendations  PT at Long-term acute care hospital     Assistance Recommended at Discharge Frequent or constant Supervision/Assistance  Patient can return home with the following Two people to help with walking and/or transfers;Two people to help with bathing/dressing/bathroom;Assistance with cooking/housework;Assist for transportation;Help with stairs or ramp for entrance    Equipment Recommendations  Other (comment) (TBD)    Recommendations for Other Services       Precautions / Restrictions Precautions Precautions: Fall Precaution Comments: trach collar, cortrak     Mobility  Bed Mobility Overal bed mobility: Needs Assistance Bed Mobility: Supine to Sit, Sit to Supine     Supine to sit: +2 for physical assistance, Total assist Sit to supine: +2 for physical assistance, Total assist   General bed mobility comments: assist for all aspects due to patient not following commands    Transfers                        Ambulation/Gait                   Stairs             Wheelchair Mobility    Modified Rankin (Stroke Patients Only)       Balance Overall balance assessment: Needs assistance Sitting-balance support: Feet supported, Bilateral upper extremity supported Sitting balance-Leahy Scale: Poor Sitting balance - Comments: pt attempting to correct posture from R lean to midline, demonstrated some pulling forward with hands on chair in front of pt Postural control: Right lateral lean, Posterior lean                                  Cognition Arousal/Alertness: Awake/alert Behavior During Therapy: Flat affect Overall Cognitive Status: Impaired/Different from baseline Area of Impairment: Following commands, Safety/judgement, Attention                   Current Attention Level: Focused   Following Commands: Follows one  step commands with increased time, Follows one step commands inconsistently Safety/Judgement: Decreased awareness of safety     General Comments: significant delay in following simple commands inconsistently        Exercises      General Comments        Pertinent Vitals/Pain Pain Assessment Pain Assessment: Faces Faces Pain Scale: No hurt Pain Intervention(s): Monitored during session    Home Living                          Prior Function             PT Goals (current goals can now be found in the care plan section) Acute Rehab PT Goals PT Goal Formulation: Patient unable to participate in goal setting Time For Goal Achievement: 10/15/21 Potential to Achieve Goals: Fair Progress towards PT goals: Progressing toward goals    Frequency    Min 2X/week      PT Plan Current plan remains appropriate    Co-evaluation PT/OT/SLP Co-Evaluation/Treatment: Yes Reason for Co-Treatment: Complexity of the patient's impairments (multi-system involvement);For patient/therapist safety   OT goals addressed during session: Strengthening/ROM      AM-PAC PT "6 Clicks" Mobility   Outcome Measure  Help needed turning from your back to your side while in a flat bed without using bedrails?: Total Help needed moving from lying on your back to sitting on the side of a flat bed without using bedrails?: Total Help needed moving to and from a bed to a chair (including a wheelchair)?: Total Help needed standing up from a chair using your arms (e.g., wheelchair or bedside chair)?: Total Help needed to walk in hospital room?: Total Help needed climbing 3-5 steps with a railing? : Total 6 Click Score: 6    End of Session Equipment Utilized During Treatment: Oxygen Activity Tolerance: Patient tolerated treatment well Patient left: in bed;with call bell/phone within reach;with family/visitor present;with bed alarm set Nurse Communication: Mobility status PT Visit Diagnosis: Muscle weakness (generalized) (M62.81);Other symptoms and signs involving the nervous system (J03.009)     Time: 2330-0762 PT Time Calculation (min) (ACUTE ONLY): 36 min  Charges:  $Therapeutic Activity: 8-22 mins                     Monserratt Knezevic A. Gilford Rile PT, DPT Acute Rehabilitation Services Office [phone]    Linna Hoff 10/01/2021, 4:42 PM

## 2021-10-02 ENCOUNTER — Inpatient Hospital Stay (HOSPITAL_COMMUNITY): Payer: Medicare HMO

## 2021-10-02 DIAGNOSIS — G9340 Encephalopathy, unspecified: Secondary | ICD-10-CM | POA: Diagnosis not present

## 2021-10-02 DIAGNOSIS — J9611 Chronic respiratory failure with hypoxia: Secondary | ICD-10-CM | POA: Diagnosis not present

## 2021-10-02 DIAGNOSIS — Z930 Tracheostomy status: Secondary | ICD-10-CM | POA: Diagnosis not present

## 2021-10-02 LAB — CBC WITH DIFFERENTIAL/PLATELET
Abs Immature Granulocytes: 0.22 10*3/uL — ABNORMAL HIGH (ref 0.00–0.07)
Basophils Absolute: 0 10*3/uL (ref 0.0–0.1)
Basophils Relative: 0 %
Eosinophils Absolute: 0.4 10*3/uL (ref 0.0–0.5)
Eosinophils Relative: 3 %
HCT: 31.2 % — ABNORMAL LOW (ref 39.0–52.0)
Hemoglobin: 10.2 g/dL — ABNORMAL LOW (ref 13.0–17.0)
Immature Granulocytes: 2 %
Lymphocytes Relative: 11 %
Lymphs Abs: 1.2 10*3/uL (ref 0.7–4.0)
MCH: 28.5 pg (ref 26.0–34.0)
MCHC: 32.7 g/dL (ref 30.0–36.0)
MCV: 87.2 fL (ref 80.0–100.0)
Monocytes Absolute: 0.7 10*3/uL (ref 0.1–1.0)
Monocytes Relative: 6 %
Neutro Abs: 8.7 10*3/uL — ABNORMAL HIGH (ref 1.7–7.7)
Neutrophils Relative %: 78 %
Platelets: 156 10*3/uL (ref 150–400)
RBC: 3.58 MIL/uL — ABNORMAL LOW (ref 4.22–5.81)
RDW: 14.6 % (ref 11.5–15.5)
WBC: 11.2 10*3/uL — ABNORMAL HIGH (ref 4.0–10.5)
nRBC: 0 % (ref 0.0–0.2)

## 2021-10-02 LAB — GLUCOSE, CAPILLARY
Glucose-Capillary: 238 mg/dL — ABNORMAL HIGH (ref 70–99)
Glucose-Capillary: 177 mg/dL — ABNORMAL HIGH (ref 70–99)
Glucose-Capillary: 240 mg/dL — ABNORMAL HIGH (ref 70–99)
Glucose-Capillary: 267 mg/dL — ABNORMAL HIGH (ref 70–99)
Glucose-Capillary: 205 mg/dL — ABNORMAL HIGH (ref 70–99)
Glucose-Capillary: 155 mg/dL — ABNORMAL HIGH (ref 70–99)

## 2021-10-02 LAB — MAGNESIUM: Magnesium: 2.2 mg/dL (ref 1.7–2.4)

## 2021-10-02 LAB — PHOSPHORUS: Phosphorus: 2.7 mg/dL (ref 2.5–4.6)

## 2021-10-02 MED ORDER — VANCOMYCIN HCL 1500 MG/300ML IV SOLN: 1500.0000 mg | INTRAVENOUS | Status: DC

## 2021-10-02 MED ORDER — INSULIN GLARGINE-YFGN 100 UNIT/ML ~~LOC~~ SOLN
44.0000 [IU] | Freq: Two times a day (BID) | SUBCUTANEOUS | Status: DC
  Administered 2021-10-03: 44 [IU] via SUBCUTANEOUS
  Filled 2021-10-02 (×5): qty 0.44

## 2021-10-02 MED ORDER — LORAZEPAM 2 MG/ML IJ SOLN
1.0000 mg | Freq: Once | INTRAMUSCULAR | Status: AC
  Administered 2021-10-02: 1 mg via INTRAVENOUS
  Filled 2021-10-02: qty 1

## 2021-10-02 NOTE — TOC Progression Note (Signed)
Transition of Care [facility]) - Progression Note    Patient Details  Name: Troy Randall MRN: [id number] Date of Birth: March 01, 1960  Transition of Care [facility]) CM/SW Contact  Joanne Chars, LCSW Phone Number: 10/02/2021, 11:15 AM  Clinical Narrative:   CSW spoke with Jen/[facility].  Pt Troy Randall was denied by [employer].  She will need clarity on further neuro treatment prior to appealing this.      Expected Discharge Plan: Long Term Acute Care (LTAC) Barriers to Discharge: Continued Medical Work up  Expected Discharge Plan and Services Expected Discharge Plan: [place] (LTAC)   Discharge Planning Services: CM Consult   Living arrangements for the past 2 months: Single Family Home                                       Social Determinants of Health (SDOH) Interventions    Readmission Risk Interventions     No data to display

## 2021-10-02 NOTE — Progress Notes (Signed)
Referring Physician(s): Calvert Cantor  Supervising Physician: Irish Lack  Patient Status:  [facility] - In-pt  Chief Complaint:  Gastrointestinal bleed s/p coil embolization of SMA territory and jejunal arch branch 9/2 with Dr Milford Cage  Acute cholecystitis with cholelithiasis s/p percutaneous cholecystostomy placement on 9/3 by Dr Grace Isaac  Request for tunneled hemodialysis catheter received today for 9/29 for plasma exchange therapy.   Subjective:  Troy Randall is a 61 yo male known to IR service from previous interventions. At time of exam, he is lying in bed with a trach collar. He is awake but not alert and oriented, with seemingly random movement of limbs noted. He is accompanied today by his wife Troy Randall, who this provider spoke with regarding the request for a tunneled HD line placement. She states that she has no concerns about his perc chole drain at this time, and that it seems to be draining well.  Allergies: Metformin and related and Penicillins  Medications: Prior to Admission medications   Medication Sig Start Date End Date Taking? Authorizing Provider  amLODipine (NORVASC) 10 MG tablet Take 1 tablet (10 mg total) by mouth daily. 06/15/17  Yes Aliene Beams, MD  aspirin EC 81 MG tablet Take 81 mg by mouth daily. Swallow whole.   Yes [provider]  clopidogrel (PLAVIX) 75 MG tablet Take 1 tablet (75 mg total) by mouth daily. 04/30/21  Yes Azucena Fallen, MD  glipiZIDE (GLUCOTROL XL) 10 MG 24 hr tablet Take 1 tablet (10 mg total) by mouth daily with breakfast. 04/16/17  Yes Hagler, Fleet Contras, MD  levocetirizine (XYZAL) 5 MG tablet Take 1 tablet by mouth at bedtime.   Yes [provider]  lisinopril (PRINIVIL,ZESTRIL) 40 MG tablet Take 1 tablet (40 mg total) by mouth daily. 05/12/17  Yes Aliene Beams, MD  metFORMIN (GLUCOPHAGE-XR) 500 MG 24 hr tablet Take 500-1,000 mg by mouth in the morning and at bedtime. 02/09/21  Yes [provider]   rosuvastatin (CRESTOR) 40 MG tablet Take 1 tablet (40 mg total) by mouth daily. 04/29/21  Yes Azucena Fallen, MD  tamsulosin (FLOMAX) 0.4 MG CAPS capsule Take 0.4 mg by mouth daily. 02/12/21  Yes [provider]  traZODone (DESYREL) 150 MG tablet Take 150 mg by mouth at bedtime. 02/04/21  Yes [provider]  TRESIBA FLEXTOUCH 200 UNIT/ML FlexTouch Pen Inject 20 Units into the skin in the morning and at bedtime. 03/14/21  Yes [provider]  BD VEO INSULIN SYRINGE U/F 31G X 15/64" 1 ML MISC  USE AS DIRECTED 06/08/17   Aliene Beams, MD  glucose blood (ONETOUCH VERIO) test strip TEST twice a day 05/04/17   Aliene Beams, MD  Insulin Pen Needle (NOVOTWIST) 32G X 5 MM MISC Use two daily to inject Victoza and Toujeo. 04/25/15   Reather Littler, MD  [facility] DELICA LANCETS 33G MISC Use to check blood sugar once a day dx code E11.65 11/21/14   Reather Littler, MD     Vital Signs: BP 109/83   Pulse (!) 105   Temp 98.9 F (37.2 C)   Resp (!) 26   Ht 6' (1.829 m)   Wt 276 lb 7.3 oz (125.4 kg)   SpO2 99%   BMI 37.49 kg/m   Physical Exam Constitutional:      General: He is not in acute distress.    Appearance: He is ill-appearing.  Cardiovascular:     Rate and Rhythm: Tachycardia present.     Pulses: Normal pulses.  Pulmonary:  Comments: Patient with trach collar in place Abdominal:     Palpations: Abdomen is soft.     Comments: RUQ drain in place. Small amount of bilious output present in gravity bag at time of exam. 65 cc of output noted in last 24 hours. Suture and stat lock in place. Dressing C/D/I. Skin intact with no sign of drainage or bleeding.  Skin:    General: Skin is warm and dry.  Neurological:     Mental Status: Mental status is at baseline.     Comments: Patient awake and moving in bed, but unable to speak and does not have appropriate response to verbal stimuli.     Imaging: CT ANGIO HEAD NECK W WO CM  Result Date: 09/30/2021 CLINICAL DATA:   Demyelinating disease. Abnormal MRI head. Rule out vasculitis. EXAM: CT ANGIOGRAPHY HEAD AND NECK TECHNIQUE: Multidetector CT imaging of the head and neck was performed using the standard protocol during bolus administration of intravenous contrast. Multiplanar CT image reconstructions and MIPs were obtained to evaluate the vascular anatomy. Carotid stenosis measurements (when applicable) are obtained utilizing NASCET criteria, using the distal internal carotid diameter as the denominator. RADIATION DOSE REDUCTION: This exam was performed according to the departmental dose-optimization program which includes automated exposure control, adjustment of the mA and/or kV according to patient size and/or use of iterative reconstruction technique. CONTRAST:  13mL OMNIPAQUE IOHEXOL 350 MG/ML SOLN COMPARISON:  MRI head 09/20/2021 and 09/21/2021.  CT head 09/12/2021 FINDINGS: CT HEAD FINDINGS Brain: Motion degraded study. Multiple attempts were made however the patient was not able to hold still. Widespread white matter hypodensities throughout both cerebral hemispheres as noted on recent MRI and CT. These appear unchanged. Ventricle size normal. No midline shift. No acute hemorrhage. Vascular: Normal arterial flow voids Skull: 1 cm lytic lesion left frontal bone. This is unchanged from prior CT 04/28/2021. This lesion appears to have fat content on MRI, suggesting hemangioma. Sinuses/Orbits: Paranasal sinuses clear.  No orbital mass Other: None Review of the MIP images confirms the above findings CTA NECK FINDINGS Aortic arch: Aortic arch not included in the study. Proximal great vessels patent. Tracheostomy in good position. NG tube in the esophagus. Right carotid system: Right carotid widely patent. Minimal atherosclerotic calcification right carotid bifurcation. Left carotid system: Calcified and noncalcified plaque proximal left internal carotid artery with 25% diameter stenosis. No other left carotid stenosis. Vertebral  arteries: Both vertebral arteries patent to the skull base without stenosis. Skeleton: No acute abnormality Other neck: 11 mm hypodense left thyroid nodule. No adenopathy in the neck. No further imaging necessary for the thyroid nodule. (Ref: J Am Coll Radiol. 2015 Feb;12(2): 143-50). Upper chest: Lung apices clear bilaterally. Review of the MIP images confirms the above findings CTA HEAD FINDINGS Anterior circulation: Internal carotid artery widely patent to the skull base and cavernous segments. No stenosis or aneurysm. Anterior and middle cerebral arteries widely patent. No stenosis or aneurysm. No vessel irregularity or vasculitis identified. Posterior circulation: Both vertebral arteries patent to the basilar without stenosis. PICA, AICA widely patent. Basilar widely patent. Superior cerebellar and posterior cerebral arteries normal. No stenosis or irregularity identified. No aneurysm. Venous sinuses: Normal venous enhancement Anatomic variants: None Review of the MIP images confirms the above findings IMPRESSION: 1. CT head is degraded by motion. Extensive white matter disease is present similar to recent CT and MRI. 2. 1 cm lytic lesion left frontal bone unchanged from prior CT. On recent MRI, this has questionable fat content suggestive of hemangioma.  However this is not definite and follow-up recommended. 3. No intracranial stenosis or large vessel occlusion. No evidence of vasculitis. 4. Right carotid widely patent. 25% diameter stenosis proximal left internal carotid artery due to atherosclerotic disease. Electronically Signed   By: Franchot Gallo M.D.   On: 09/30/2021 11:35    Labs:  CBC: Recent Labs    09/29/21 0426 09/30/21 0330 10/01/21 0359 10/02/21 0355  WBC 13.8* 11.8* 12.5* 11.2*  HGB 11.5* 10.9* 10.7* 10.2*  HCT 36.6* 34.4* 34.0* 31.2*  PLT 205 155 149* 156    COAGS: Recent Labs    04/28/21 1504 09/05/21 0307 09/06/21 2240 09/07/21 2056  INR 1.0 1.2 1.6* 2.0*  APTT 26  --    --  32    BMP: Recent Labs    09/28/21 0440 09/29/21 0426 09/30/21 0330 10/01/21 0359  NA 151* 144 141 142  K 4.1 3.8 3.7 3.7  CL 114* 107 106 107  CO2 29 29 29 28   GLUCOSE 181* 192* 161* 139*  BUN 35* 30* 21* 21*  CALCIUM 8.6* 8.5* 8.0* 8.2*  CREATININE 0.78 0.72 0.63 0.65  GFRNONAA >60 >60 >60 >60    LIVER FUNCTION TESTS: Recent Labs    09/28/21 0440 09/29/21 0426 09/30/21 0330 10/01/21 0359  BILITOT 0.5 0.5 0.7 0.5  AST 25 27 23 24   ALT 37 39 34 31  ALKPHOS 89 87 86 85  PROT 5.8* 5.4* 5.1* 5.2*  ALBUMIN 2.4* 2.2* 1.9* 1.9*    Assessment and Plan: Well-functioning percutaneous cholecystostomy drain in place. Patient evaluated for tunneled HD catheter placement. Primary team is requesting placement for 9/29 so that plasma exchange therapy can begin after placement.   Risks and benefits discussed with the patient's wife including, but not limited to bleeding, infection, vascular injury, pneumothorax which may require chest tube placement, air embolism or even death  All of the patient's wife's questions were answered, patient is agreeable to proceed. Consent signed and in IR suite.   Drain Plan: Continue TID flushes with 5 cc NS. Record output Q shift. Dressing changes QD or PRN if soiled.  Call IR APP or on call IR MD if difficulty flushing or sudden change in drain output.  Repeat imaging/possible drain injection once output < 10 mL/QD (excluding flush material). Consideration for drain removal if output is < 10 mL/QD (excluding flush material), pending discussion with the providing surgical service.   Discharge planning: Please contact IR APP or on call IR MD prior to patient d/c to ensure appropriate follow up plans are in place. Typically patient will follow up with IR clinic 6-8 weeks post d/c for repeat imaging/possible drain injection. IR scheduler will contact patient with date/time of appointment. Patient will need to flush drain QD with 5 cc NS,  record output QD, dressing changes every 2-3 days or earlier if soiled.    IR will continue to follow - please call with questions or concerns.  Electronically Signed: Lura Em, PA-C 10/02/2021, 12:42 PM   I spent a total of 25 Minutes at the the patient's bedside AND on the patient's hospital floor or unit, greater than 50% of which was counseling/coordinating care for perc chole follow-up and tunneled HD catheter placement evaluation.

## 2021-10-02 NOTE — Progress Notes (Signed)
[facility] Progress Note  Patient: Troy Randall     FXT:[id number]  DOA: 09/04/2021   PCP: Benita Stabile, MD       Brief hospital course:   Patient is a 61 years old male with medical history of hypertension, hyperlipidemia, type 2 diabetes, CVA, presented to the hospital with GI bleed.  Patient underwent IR embolization followed by percutaneous cholecystostomy tube placement due to ongoing altered mental status.  MRI of the brain showed numerous contrast-enhancing bilateral white matter lesions concerning for myelinating disease.  Neurology was consulted and underwent lumbar puncture on 09/22/2021 with low suspicion for infectious etiology.  Neurology has initiated the patient on high-dose steroids on 09/23/2021.  Patient was then transferred to hospitalist service on 09/24/2021.    Significant events. 8/31 presented to [facility] ED, [name] Admit, Gi Consult 9/1 CTA> no evidence of active GI bleed, 2 U PRBC 9/2 2 U PRBC, colonoscopy > no active bleed found but large clots throughout entire colon. EGD> small hiatal hernia, erosive gastropathy with no stigmata of recent bleeding, and non-bleeding duodenal diverticulum.   Underwent then small bowel enteroscopy which noted intermittent blood pumping in the proximal jejunum but was unable to be reached despite multiple attempts, was injected and clip placed. Repeat CTA repeated, which showed positive jejunal diverticular bleeding. IR> active extrav at [facility] territory, jejunal arcade branch, successful coil embo 9/3 Intubated. CTA> no evidence of active GI bleeding.  New finding of abnormal gallbladder with nondependent air, gallbladder wall air, pericholecystic and right upper quadrant information.  IR placed percutaneous cholecystotomy tube.  9/4 R. IJ central line placed with dialysis triple lumen catheter 9/5 CRRT initiated 9/6 Bedside EGD without any evidence of bleeding. CTA a/p without etiology for bleed 9/8 CRRT discontinued. Required ETT  exchange yesterday due to blown cuff. 9/11 Bronchoscopic guided percutaneous tracheostomy placement.  Right IJ central line removed. 9/15 flexible sigmoidoscopy with removal of endoscopy 9/16 MRI brain w/o contrast amended study secondary to range of motion.  Rapid development numerous round white matter lesions in both cerebral hemispheres.  Prior MRIs. 9/17 MRI brain: Numerous contrast-enhancing bilateral white matter lesions, consistent with active demyelination. 9/18 Lumbar puncture  9/19 MRI cervical spine, thoracic : Limited exam, within limitations there is not definitive evidence of active demyelinization. Within limitations of motion artifact, there may be demyelinating lesions at the C6 and T1 vertebral body levels. -S/p  LP on 9/18. 9/25-clinical exam not improved despite completion of 5-day IV steroids.  Neurology planning to repeat MRI of the brain cervical spine thoracic spine. Possible IR consult for catheter placement for possible Plex therapy if abnormal scans 9/26- scans attempted with ativan      Subjective:  Non verbal  Assessment and Plan: Principal Problem:   Acute metabolic encephalopathy - appreciate neurology assistance - high dose IV steroids given for 5 days for possible MS - Neurology plans to start plasmapheresis  Active Problems: Tracheostomy 9/11 - cont trach collar  Hemorrhagic shock with acute blood loss anemia secondary to lower GI bleed Iron deficiency anemia - 9/2 coil embolization of distal branch of SMA - On Protonix twice daily and baby aspirin daily (9/19)  Acute emphysematous E. coli cholecystitis - 9/3-percutaneous cholecystostomy drain placed - Completed 7 days of ceftriaxone  AKI - Status post CRRT that was discontinued on 9/8  Hypernatremia - Continue free water at 400 cc every 6 hours    Diabetes mellitus type 2 - receiving Semglee and Novolog sliding scale insulin - increase Semglee  to 44 U BID in attempt to reduce Novolog  use - Receiving core track tube feeds    Essential hypertension -cont Amlodipine - holding Lisinopril  BPH - uses Tamsulosin as outpt     Morbid Obesity Body mass index is 37.49 kg/m.   DVT prophylaxis:  Place and maintain sequential compression device Start: 09/26/21 1759     Code Status: Full Code  Consultants: Neurology, GI, PCCM, general surgery Level of Care: Level of care: Progressive  Objective:   Vitals:   10/02/21 1010 10/02/21 1131 10/02/21 1505 10/02/21 1557  BP: 109/83   125/85  Pulse:  (!) 105 (!) 110 100  Resp:  (!) 26  20  Temp:    98.5 F (36.9 C)  TempSrc:    Oral  SpO2:  99% 97% 99%  Weight:      Height:       Filed Weights   09/27/21 0500 09/30/21 0500 10/01/21 0500  Weight: 123.5 kg 126.1 kg 125.4 kg   Exam: General exam: Appears restless today- remains poorly responsive HEENT: trach present Respiratory system: Clear to auscultation.  Cardiovascular system: S1 & S2 heard  Gastrointestinal system: Abdomen soft, non-tender, nondistended. Normal bowel sounds   Extremities: No cyanosis, clubbing or edema        Imaging and lab data was personally reviewed    CBC: Recent Labs  Lab 09/28/21 0440 09/29/21 0426 09/30/21 0330 10/01/21 0359 10/02/21 0355  WBC 14.7* 13.8* 11.8* 12.5* 11.2*  NEUTROABS 13.4* 11.2* 9.4* 9.4* 8.7*  HGB 11.9* 11.5* 10.9* 10.7* 10.2*  HCT 38.9* 36.6* 34.4* 34.0* 31.2*  MCV 93.1 90.1 89.4 89.9 87.2  PLT 238 205 155 149* 096    Basic Metabolic Panel: Recent Labs  Lab 09/27/21 0300 09/28/21 0440 09/29/21 0426 09/30/21 0330 10/01/21 0359 10/02/21 0355  NA 153* 151* 144 141 142  --   K 3.8 4.1 3.8 3.7 3.7  --   CL 120* 114* 107 106 107  --   CO2 27 29 29 29 28   --   GLUCOSE 218* 181* 192* 161* 139*  --   BUN 37* 35* 30* 21* 21*  --   CREATININE 0.86 0.78 0.72 0.63 0.65  --   CALCIUM 8.8* 8.6* 8.5* 8.0* 8.2*  --   MG 2.6* 2.8* 2.6* 2.3 2.2 2.2  PHOS 3.8 4.3 3.6 3.6 3.2 2.7    GFR: Estimated  Creatinine Clearance: 136 mL/min (by C-G formula based on SCr of 0.65 mg/dL).  Scheduled Meds:  amLODipine  10 mg Per Tube Daily   aspirin  81 mg Per Tube Daily   Chlorhexidine Gluconate Cloth  6 each Topical Daily   docusate  100 mg Per Tube Daily   feeding supplement (PROSource TF20)  60 mL Per Tube BID   free water  400 mL Per Tube Q6H   influenza vac split quadrivalent PF  0.5 mL Intramuscular Tomorrow-1000   insulin aspart  0-15 Units Subcutaneous Q4H   insulin aspart  0-5 Units Subcutaneous QHS   insulin glargine-yfgn  40 Units Subcutaneous BID   mouth rinse  15 mL Mouth Rinse Q2H   pantoprazole  40 mg Per Tube BID   pneumococcal 23 valent vaccine  0.5 mL Intramuscular Tomorrow-1000   polyethylene glycol  17 g Per Tube Daily   rosuvastatin  40 mg Per Tube Daily   sodium chloride flush  10-40 mL Intracatheter Q12H   sodium chloride flush  5 mL Intracatheter Q8H   Zinc Oxide  Topical BID   Continuous Infusions:  sodium chloride Stopped (09/15/21 1519)   sodium chloride     dextrose     feeding supplement (VITAL 1.5 CAL) 1,000 mL (10/01/21 0249)   lactated ringers     [START ON 10/03/2021] vancomycin       LOS: 28 days   Author: Calvert Cantor  10/02/2021 4:31 PM

## 2021-10-02 NOTE — Progress Notes (Addendum)
Neurology Progress Note  Subjective: No significant change from yesterday.  Objective: Current vital signs: BP 109/83   Pulse (!) 105   Temp 98.9 F (37.2 C)   Resp (!) 26   Ht 6' (1.829 m)   Wt 125.4 kg   SpO2 99%   BMI 37.49 kg/m  Vital signs in last 24 hours: Temp:  [98 F (36.7 C)-98.9 F (37.2 C)] 98.9 F (37.2 C) (09/28 0819) Pulse Rate:  [104-119] 105 (09/28 1131) Resp:  [18-26] 26 (09/28 1131) BP: (109-148)/(55-85) 109/83 (09/28 1010) SpO2:  [99 %-100 %] 99 % (09/28 1131) FiO2 (%):  [28 %] 28 % (09/28 1131)  Intake/Output from previous day: 09/27 0701 - 09/28 0700 In: 10  Out: 1165 [Urine:1100; Drains:65] Intake/Output this shift: Total I/O In: -  Out: 250 [Urine:250] Nutritional status:  Diet Order             Diet NPO time specified  Diet effective now                  HEENT: [place]/AT. Trach collar is noted with bloody secretions noted. Ext: No edema   Neurologic Exam: Ment: Awake with eyes half-open, left more so than right.  Makes eye contact.  Still with no attempts to speak. Able to move all extremities purposefully but does not move them to command.  Nods head. CN: PERRL. Fixates briefly. Eyes are conjugate. Will intermittently track over a limited range of EOM. Face is symmetric.  Motor/Sensory: Spontaneous movement of the left upper extremity greater than the right upper extremity.  Lateral lower extremities move spontaneously without gravity.  Withdraws to noxious stimuli briskly throughout. Cerebellar/Gait: Unable to assess  Lab Results: Results for orders placed or performed during the hospital encounter of 09/04/21 (from the past 48 hour(s))  Glucose, capillary     Status: Abnormal   Collection Time: 09/30/21  4:04 PM  Result Value Ref Range   Glucose-Capillary 155 (H) 70 - 99 mg/dL    Comment: Glucose reference range applies only to samples taken after fasting for at least 8 hours.  Glucose, capillary     Status: Abnormal    Collection Time: 09/30/21  9:13 PM  Result Value Ref Range   Glucose-Capillary 174 (H) 70 - 99 mg/dL    Comment: Glucose reference range applies only to samples taken after fasting for at least 8 hours.  Glucose, capillary     Status: Abnormal   Collection Time: 10/01/21 12:22 AM  Result Value Ref Range   Glucose-Capillary 129 (H) 70 - 99 mg/dL    Comment: Glucose reference range applies only to samples taken after fasting for at least 8 hours.  Glucose, capillary     Status: Abnormal   Collection Time: 10/01/21  2:58 AM  Result Value Ref Range   Glucose-Capillary 121 (H) 70 - 99 mg/dL    Comment: Glucose reference range applies only to samples taken after fasting for at least 8 hours.  Glucose, capillary     Status: Abnormal   Collection Time: 10/01/21  3:20 AM  Result Value Ref Range   Glucose-Capillary 128 (H) 70 - 99 mg/dL    Comment: Glucose reference range applies only to samples taken after fasting for at least 8 hours.  Comprehensive metabolic panel     Status: Abnormal   Collection Time: 10/01/21  3:59 AM  Result Value Ref Range   Sodium 142 135 - 145 mmol/L   Potassium 3.7 3.5 - 5.1 mmol/L  Chloride 107 98 - 111 mmol/L   CO2 28 22 - 32 mmol/L   Glucose, Bld 139 (H) 70 - 99 mg/dL    Comment: Glucose reference range applies only to samples taken after fasting for at least 8 hours.   BUN 21 (H) 6 - 20 mg/dL   Creatinine, Ser 1.19 0.61 - 1.24 mg/dL   Calcium 8.2 (L) 8.9 - 10.3 mg/dL   Total Protein 5.2 (L) 6.5 - 8.1 g/dL   Albumin 1.9 (L) 3.5 - 5.0 g/dL   AST 24 15 - 41 U/L   ALT 31 0 - 44 U/L   Alkaline Phosphatase 85 38 - 126 U/L   Total Bilirubin 0.5 0.3 - 1.2 mg/dL   GFR, Estimated >14 >78 mL/min    Comment: (NOTE) Calculated using the CKD-EPI Creatinine Equation (2021)    Anion gap 7 5 - 15    Comment: Performed at [facility], [address], [place], [place] [address]  Magnesium     Status: None   Collection Time: 10/01/21  3:59 AM  Result Value Ref  Range   Magnesium 2.2 1.7 - 2.4 mg/dL    Comment: Performed at [facility], [address] [place], [place] [address]  Phosphorus     Status: None   Collection Time: 10/01/21  3:59 AM  Result Value Ref Range   Phosphorus 3.2 2.5 - 4.6 mg/dL    Comment: Performed at [facility], [address], [place], [place] [address]  CBC with Differential/Platelet     Status: Abnormal   Collection Time: 10/01/21  3:59 AM  Result Value Ref Range   WBC 12.5 (H) 4.0 - 10.5 K/uL   RBC 3.78 (L) 4.22 - 5.81 MIL/uL   Hemoglobin 10.7 (L) 13.0 - 17.0 g/dL   HCT 96.2 (L) 95.2 - 84.1 %   MCV 89.9 80.0 - 100.0 fL   MCH 28.3 26.0 - 34.0 pg   MCHC 31.5 30.0 - 36.0 g/dL   RDW 32.4 40.1 - 02.7 %   Platelets 149 (L) 150 - 400 K/uL   nRBC 0.0 0.0 - 0.2 %   Neutrophils Relative % 75 %   Neutro Abs 9.4 (H) 1.7 - 7.7 K/uL   Lymphocytes Relative 12 %   Lymphs Abs 1.5 0.7 - 4.0 K/uL   Monocytes Relative 6 %   Monocytes Absolute 0.7 0.1 - 1.0 K/uL   Eosinophils Relative 4 %   Eosinophils Absolute 0.5 0.0 - 0.5 K/uL   Basophils Relative 0 %   Basophils Absolute 0.0 0.0 - 0.1 K/uL   Immature Granulocytes 3 %   Abs Immature Granulocytes 0.35 (H) 0.00 - 0.07 K/uL    Comment: Performed at [facility], [address], [place], [place] [address]  Glucose, capillary     Status: Abnormal   Collection Time: 10/01/21  6:11 AM  Result Value Ref Range   Glucose-Capillary 161 (H) 70 - 99 mg/dL    Comment: Glucose reference range applies only to samples taken after fasting for at least 8 hours.  Glucose, capillary     Status: Abnormal   Collection Time: 10/01/21  8:10 AM  Result Value Ref Range   Glucose-Capillary 146 (H) 70 - 99 mg/dL    Comment: Glucose reference range applies only to samples taken after fasting for at least 8 hours.  Glucose, capillary     Status: Abnormal   Collection Time: 10/01/21 12:15 PM  Result Value Ref  Range   Glucose-Capillary 215 (H) 70 - 99 mg/dL    Comment: Glucose  reference range applies only to samples taken after fasting for at least 8 hours.  Glucose, capillary     Status: Abnormal   Collection Time: 10/01/21  5:02 PM  Result Value Ref Range   Glucose-Capillary 183 (H) 70 - 99 mg/dL    Comment: Glucose reference range applies only to samples taken after fasting for at least 8 hours.  Glucose, capillary     Status: Abnormal   Collection Time: 10/01/21  7:55 PM  Result Value Ref Range   Glucose-Capillary 177 (H) 70 - 99 mg/dL    Comment: Glucose reference range applies only to samples taken after fasting for at least 8 hours.  Glucose, capillary     Status: Abnormal   Collection Time: 10/02/21  1:20 AM  Result Value Ref Range   Glucose-Capillary 238 (H) 70 - 99 mg/dL    Comment: Glucose reference range applies only to samples taken after fasting for at least 8 hours.  Magnesium     Status: None   Collection Time: 10/02/21  3:55 AM  Result Value Ref Range   Magnesium 2.2 1.7 - 2.4 mg/dL    Comment: Performed at [facility], [place] [address] [place], [place] [address]  Phosphorus     Status: None   Collection Time: 10/02/21  3:55 AM  Result Value Ref Range   Phosphorus 2.7 2.5 - 4.6 mg/dL    Comment: Performed at [facility] [address] [place], [place] [address]  CBC with Differential/Platelet     Status: Abnormal   Collection Time: 10/02/21  3:55 AM  Result Value Ref Range   WBC 11.2 (H) 4.0 - 10.5 K/uL   RBC 3.58 (L) 4.22 - 5.81 MIL/uL   Hemoglobin 10.2 (L) 13.0 - 17.0 g/dL   HCT 31.2 (L) 39.0 - 52.0 %   MCV 87.2 80.0 - 100.0 fL   MCH 28.5 26.0 - 34.0 pg   MCHC 32.7 30.0 - 36.0 g/dL   RDW 14.6 11.5 - 15.5 %   Platelets 156 150 - 400 K/uL   nRBC 0.0 0.0 - 0.2 %   Neutrophils Relative % 78 %   Neutro Abs 8.7 (H) 1.7 - 7.7 K/uL   Lymphocytes Relative 11 %   Lymphs Abs 1.2 0.7 - 4.0 K/uL   Monocytes Relative 6 %   Monocytes Absolute 0.7 0.1 - 1.0 K/uL   Eosinophils Relative 3 %   Eosinophils Absolute 0.4 0.0 - 0.5  K/uL   Basophils Relative 0 %   Basophils Absolute 0.0 0.0 - 0.1 K/uL   Immature Granulocytes 2 %   Abs Immature Granulocytes 0.22 (H) 0.00 - 0.07 K/uL    Comment: Performed at [facility] [address], [place], [place] [address]  Glucose, capillary     Status: Abnormal   Collection Time: 10/02/21  5:58 AM  Result Value Ref Range   Glucose-Capillary 177 (H) 70 - 99 mg/dL    Comment: Glucose reference range applies only to samples taken after fasting for at least 8 hours.  Glucose, capillary     Status: Abnormal   Collection Time: 10/02/21  9:42 AM  Result Value Ref Range   Glucose-Capillary 240 (H) 70 - 99 mg/dL    Comment: Glucose reference range applies only to samples taken after fasting for at least 8 hours.  Glucose, capillary     Status: Abnormal  Collection Time: 10/02/21 11:56 AM  Result Value Ref Range   Glucose-Capillary 267 (H) 70 - 99 mg/dL    Comment: Glucose reference range applies only to samples taken after fasting for at least 8 hours.    Recent Results (from the past 240 hour(s))  CSF culture w Gram Stain     Status: None   Collection Time: 09/22/21  1:48 PM   Specimen: CSF; Cerebrospinal Fluid  Result Value Ref Range Status   Specimen Description CSF  Final   Special Requests NONE  Final   Gram Stain   Final    WBC PRESENT, PREDOMINANTLY MONONUCLEAR NO ORGANISMS SEEN CYTOSPIN SMEAR    Culture   Final    NO GROWTH 3 DAYS Performed at [facility], [address], [place], [place] [address]    Report Status 09/25/2021 FINAL  Final  Culture, fungus without smear     Status: None (Preliminary result)   Collection Time: 09/22/21  1:49 PM   Specimen: CSF; Cerebrospinal Fluid  Result Value Ref Range Status   Specimen Description CSF  Final   Special Requests NONE  Final   Culture   Final    NO FUNGUS ISOLATED AFTER 9 DAYS Performed at [facility], [address], [place], [place] [address]    Report Status PENDING  Incomplete   Anaerobic culture w Gram Stain     Status: None   Collection Time: 09/22/21  1:49 PM   Specimen: CSF; Cerebrospinal Fluid  Result Value Ref Range Status   Specimen Description CSF  Final   Special Requests NONE  Final   Gram Stain NO WBC SEEN NO ORGANISMS SEEN   Final   Culture   Final    NO ANAEROBES ISOLATED Performed at [facility], [address] [place], [place] [address]    Report Status 09/27/2021 FINAL  Final    Lipid Panel No results for input(s): "CHOL", "TRIG", "HDL", "CHOLHDL", "VLDL", "LDLCALC" in the last 72 hours.  Studies/Results: No results found.  Medications: Scheduled:  amLODipine  10 mg Per Tube Daily   aspirin  81 mg Per Tube Daily   Chlorhexidine Gluconate Cloth  6 each Topical Daily   docusate  100 mg Per Tube Daily   feeding supplement (PROSource TF20)  60 mL Per Tube BID   free water  400 mL Per Tube Q6H   influenza vac split quadrivalent PF  0.5 mL Intramuscular Tomorrow-1000   insulin aspart  0-15 Units Subcutaneous Q4H   insulin aspart  0-5 Units Subcutaneous QHS   insulin glargine-yfgn  40 Units Subcutaneous BID   LORazepam  1 mg Intravenous Once   mouth rinse  15 mL Mouth Rinse Q2H   pantoprazole  40 mg Per Tube BID   pneumococcal 23 valent vaccine  0.5 mL Intramuscular Tomorrow-1000   polyethylene glycol  17 g Per Tube Daily   rosuvastatin  40 mg Per Tube Daily   sodium chloride flush  10-40 mL Intracatheter Q12H   sodium chloride flush  5 mL Intracatheter Q8H   Zinc Oxide   Topical BID   Continuous:  sodium chloride Stopped (09/15/21 1519)   sodium chloride     dextrose     feeding supplement (VITAL 1.5 CAL) 1,000 mL (10/01/21 0249)   lactated ringers     Assessment: 61 year old patient with history of stroke in April of this year with minimal residual deficits, HTN, HLD, DM and recent jejunal bleed,  presented with continued AMS and concerning multifocal enhancing white matter lesions on MRI that exhibit morphologies and a  distribution suggestive of MS or ADEM, with vasculitis also a consideration, but lower on the differential diagnosis. He has no prior history of demyelinating disease and wife states no prior episodes of focal weakness or vision loss. Also with no family history of MS. Spine imaging negative for cord lesions. Has completed a 5 day course of IV Solumedrol as empiric treatment for the lesions without improvement in presentation. - Serial exam findings: - Exam today: Unchanged.  Does not follow commands.  Spontaneous antigravity movements of left > right upper extremity.  Bilateral lower extremities move equally without gravity.  Does not attempt to speak.  Per wife at bedside, patient remains significantly altered from baseline. - Imaging: - White matter changes on MRI with multiple foci of bland restricted diffusion may represent strokes, although pattern would be unusual. The locations and morphologies of the lesions could be consistent with demyelinating disease, but the enhancing pattern would be atypical. Infectious etiology unlikely as there is no perilesional edema or necrotic core associated with the lesions. - MRI brain follow up post-contrast imaging reveals >20 contrast enhancing lesions in the white matter bilaterally. Strongly favor inflammation / active demyelination possibly MS vs ADEM on initial interpretation. Based on personal review of the images by they seem more complatible with ADEM or a vasculitic process than with MS. Sequela of septic shock is suspected, but I could not find any literature with specific brain imaging to support or refute this component of the DDx.  - MRI of cervical and thoracic spine w/wo contrast: Markedly limited exam for assessment of the spinal cord due to the degree of motion artifact. Within this limitation, there is no definite evidence of active demyelination. Within limitations of motion artifact, there may be demyelinating lesions at the C6 and T1 vertebral  body levels. Repeat exam is recommended if more definitive characterization as clinically warranted. - Inflammation favored over infection given that he has been afebrile and primarily altered but not ill-appearing therefore no indication for empiric CNS coverage at this time.  - LP was performed by [facility] on 9/18. CSF findings: - Glucose 126, RBC count 1-5 (unremarkable), clear, colorless, total protein normal, WBC normal.  - Gram stain: Negative - Fungal and bacterial cultures pending - Infectious disease [employer] panel negative for all pathogens tested, including cryptococcus, VZV and HSV.  - Cryptococcal Ag negative.  - IgG index is normal at 0.5.  - OCBs testing has resulted: No oligoclonal bands seen - Serum labs:  - Vitamin B12 level consistent with supplementation. Mildly elevated Na. BUN 30. ALT 48. TSH normal. ANA negative.  - C3 complement level elevated at 184. Autoimmune panel otherwise negative.  - HIV screen negative.  - DDx for AMS: May be secondary to a CNS inflammatory process as evidenced by the enhancing lesions seen on brain MRI. Inflammation may not be restricted only to the regions of enhancement visible on MRI. Sequelae of end organ damage from his septic shock is also on the DDx (see footnote below). 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  - DDx for white matter lesions on MRI: ADEM, multiple sclerosis and cerebral vasculitis (isolated or as a component of a possible systemic vasculitis) are felt to be highest on the DDx. He did have matched bands on testing for oligoclonal  bands. Which suggests peripheral antibody production but could also be related to his recent cholecystitis. - Assessment of response to 5 day course of pulsed-dose steroids (completed on Saturday 9/23): fluctuating exam but at most had a gradual improvement over the 5 day  period but he still remains significantly altered from his baseline.   Recommendations: - OOB to chair when possible to reduce the risk of developing an ICU neuropathy or myopathy - Repeat MRI brain, cervical, thoracic spine w/wo contrast. Have been pending for a while and he moves which makes it difficult to get imaging. - CT angio head and neck not concerning for LVO or vasculitis. - Follow up neuroimaging as it is complete - Discussed PLEX therapy with patient's wife at bedside. Will do PLEX x 5 sessions. PLEX orders placed and IR consulted for temp HD cath placement. No ACE inhibitors and no ARBs while on PLEX.    LOS: 28 days  -- Erick Blinks [facility] Pager Number [phone]

## 2021-10-03 ENCOUNTER — Inpatient Hospital Stay (HOSPITAL_COMMUNITY): Payer: Medicare HMO

## 2021-10-03 DIAGNOSIS — A419 Sepsis, unspecified organism: Secondary | ICD-10-CM | POA: Diagnosis not present

## 2021-10-03 DIAGNOSIS — J690 Pneumonitis due to inhalation of food and vomit: Secondary | ICD-10-CM

## 2021-10-03 DIAGNOSIS — R0603 Acute respiratory distress: Secondary | ICD-10-CM

## 2021-10-03 DIAGNOSIS — Z930 Tracheostomy status: Secondary | ICD-10-CM | POA: Diagnosis not present

## 2021-10-03 DIAGNOSIS — G9341 Metabolic encephalopathy: Secondary | ICD-10-CM | POA: Diagnosis not present

## 2021-10-03 DIAGNOSIS — R6520 Severe sepsis without septic shock: Secondary | ICD-10-CM

## 2021-10-03 LAB — CBC WITH DIFFERENTIAL/PLATELET
Abs Immature Granulocytes: 0.3 10*3/uL — ABNORMAL HIGH (ref 0.00–0.07)
Basophils Absolute: 0 10*3/uL (ref 0.0–0.1)
Basophils Relative: 0 %
Eosinophils Absolute: 0.1 10*3/uL (ref 0.0–0.5)
Eosinophils Relative: 1 %
HCT: 34.5 % — ABNORMAL LOW (ref 39.0–52.0)
Hemoglobin: 11.3 g/dL — ABNORMAL LOW (ref 13.0–17.0)
Immature Granulocytes: 2 %
Lymphocytes Relative: 3 %
Lymphs Abs: 0.5 10*3/uL — ABNORMAL LOW (ref 0.7–4.0)
MCH: 29.1 pg (ref 26.0–34.0)
MCHC: 32.8 g/dL (ref 30.0–36.0)
MCV: 88.9 fL (ref 80.0–100.0)
Monocytes Absolute: 0.4 10*3/uL (ref 0.1–1.0)
Monocytes Relative: 3 %
Neutro Abs: 14.5 10*3/uL — ABNORMAL HIGH (ref 1.7–7.7)
Neutrophils Relative %: 91 %
Platelets: 183 10*3/uL (ref 150–400)
RBC: 3.88 MIL/uL — ABNORMAL LOW (ref 4.22–5.81)
RDW: 15 % (ref 11.5–15.5)
WBC: 15.9 10*3/uL — ABNORMAL HIGH (ref 4.0–10.5)
nRBC: 0 % (ref 0.0–0.2)

## 2021-10-03 LAB — POCT I-STAT 7, (LYTES, BLD GAS, ICA,H+H)
Acid-Base Excess: 3 mmol/L — ABNORMAL HIGH (ref 0.0–2.0)
Bicarbonate: 26.1 mmol/L (ref 20.0–28.0)
Calcium, Ion: 1.14 mmol/L — ABNORMAL LOW (ref 1.15–1.40)
HCT: 33 % — ABNORMAL LOW (ref 39.0–52.0)
Hemoglobin: 11.2 g/dL — ABNORMAL LOW (ref 13.0–17.0)
O2 Saturation: 100 %
Patient temperature: 103.2
Potassium: 3.9 mmol/L (ref 3.5–5.1)
Sodium: 139 mmol/L (ref 135–145)
TCO2: 27 mmol/L (ref 22–32)
pCO2 arterial: 36.1 mmHg (ref 32–48)
pH, Arterial: 7.476 — ABNORMAL HIGH (ref 7.35–7.45)
pO2, Arterial: 171 mmHg — ABNORMAL HIGH (ref 83–108)

## 2021-10-03 LAB — BASIC METABOLIC PANEL
Anion gap: 7 (ref 5–15)
BUN: 17 mg/dL (ref 6–20)
CO2: 27 mmol/L (ref 22–32)
Calcium: 8.2 mg/dL — ABNORMAL LOW (ref 8.9–10.3)
Chloride: 106 mmol/L (ref 98–111)
Creatinine, Ser: 0.77 mg/dL (ref 0.61–1.24)
GFR, Estimated: >60 mL/min (ref >60)
Glucose, Bld: 147 mg/dL — ABNORMAL HIGH (ref 70–99)
Potassium: 3.9 mmol/L (ref 3.5–5.1)
Sodium: 140 mmol/L (ref 135–145)

## 2021-10-03 LAB — APTT: aPTT: <20 seconds — ABNORMAL LOW (ref 24–36)

## 2021-10-03 LAB — GLUCOSE, CAPILLARY
Glucose-Capillary: 145 mg/dL — ABNORMAL HIGH (ref 70–99)
Glucose-Capillary: 90 mg/dL (ref 70–99)
Glucose-Capillary: 94 mg/dL (ref 70–99)
Glucose-Capillary: 82 mg/dL (ref 70–99)
Glucose-Capillary: 94 mg/dL (ref 70–99)
Glucose-Capillary: 115 mg/dL — ABNORMAL HIGH (ref 70–99)
Glucose-Capillary: 157 mg/dL — ABNORMAL HIGH (ref 70–99)

## 2021-10-03 LAB — STREP PNEUMONIAE URINARY ANTIGEN: Strep Pneumo Urinary Antigen: NEGATIVE — AB

## 2021-10-03 LAB — PROTIME-INR
INR: 1 (ref 0.8–1.2)
Prothrombin Time: 13.5 seconds (ref 11.4–15.2)

## 2021-10-03 LAB — MAGNESIUM: Magnesium: 1.8 mg/dL (ref 1.7–2.4)

## 2021-10-03 LAB — PHOSPHORUS: Phosphorus: 3 mg/dL (ref 2.5–4.6)

## 2021-10-03 LAB — LACTIC ACID, PLASMA
Lactic Acid, Venous: 2.1 mmol/L (ref 0.5–1.9)
Lactic Acid, Venous: 1.9 mmol/L (ref 0.5–1.9)

## 2021-10-03 LAB — LIPASE, BLOOD: Lipase: 42 U/L (ref 11–51)

## 2021-10-03 MED ORDER — ACETAMINOPHEN 325 MG PO TABS
650.0000 mg | ORAL_TABLET | Freq: Four times a day (QID) | ORAL | Status: DC | PRN
  Administered 2021-10-03: 650 mg

## 2021-10-03 MED ORDER — ACETAMINOPHEN 325 MG PO TABS: 650.0000 mg | ORAL_TABLET | Freq: Four times a day (QID) | ORAL | Status: DC | PRN

## 2021-10-03 MED ORDER — VANCOMYCIN HCL 2000 MG/400ML IV SOLN
2000.0000 mg | Freq: Once | INTRAVENOUS | Status: DC
  Filled 2021-10-03: qty 400

## 2021-10-03 MED ORDER — MIDAZOLAM HCL 2 MG/2ML IJ SOLN
1.0000 mg | INTRAMUSCULAR | Status: DC | PRN
  Filled 2021-10-03: qty 2

## 2021-10-03 MED ORDER — SODIUM CHLORIDE 0.9 % IV SOLN
2.0000 g | Freq: Once | INTRAVENOUS | Status: DC
  Filled 2021-10-03: qty 12.5

## 2021-10-03 MED ORDER — INSULIN GLARGINE-YFGN 100 UNIT/ML ~~LOC~~ SOLN
25.0000 [IU] | Freq: Two times a day (BID) | SUBCUTANEOUS | Status: DC
  Administered 2021-10-03 – 2021-10-05 (×5): 25 [IU] via SUBCUTANEOUS
  Filled 2021-10-03 (×6): qty 0.25

## 2021-10-03 MED ORDER — DEXMEDETOMIDINE HCL IN NACL 400 MCG/100ML IV SOLN
0.4000 ug/kg/h | INTRAVENOUS | Status: DC
  Administered 2021-10-03: 0.4 ug/kg/h via INTRAVENOUS
  Filled 2021-10-03 (×2): qty 100

## 2021-10-03 MED ORDER — VANCOMYCIN HCL 1500 MG/300ML IV SOLN
1500.0000 mg | Freq: Two times a day (BID) | INTRAVENOUS | Status: AC
  Administered 2021-10-03 – 2021-10-09 (×13): 1500 mg via INTRAVENOUS
  Filled 2021-10-03 (×13): qty 300

## 2021-10-03 MED ORDER — GADOPICLENOL 0.5 MMOL/ML IV SOLN
10.0000 mL | Freq: Once | INTRAVENOUS | Status: AC | PRN
  Administered 2021-10-03: 10 mL via INTRAVENOUS

## 2021-10-03 MED ORDER — SODIUM CHLORIDE 0.9 % IV SOLN
2.0000 g | Freq: Three times a day (TID) | INTRAVENOUS | Status: DC
  Administered 2021-10-03 – 2021-10-06 (×10): 2 g via INTRAVENOUS
  Filled 2021-10-03 (×9): qty 12.5

## 2021-10-03 MED ORDER — LACTATED RINGERS IV BOLUS
500.0000 mL | Freq: Once | INTRAVENOUS | Status: AC
  Administered 2021-10-03: 500 mL via INTRAVENOUS

## 2021-10-03 MED ORDER — PHENYLEPHRINE 80 MCG/ML (10ML) SYRINGE FOR IV PUSH (FOR BLOOD PRESSURE SUPPORT)
PREFILLED_SYRINGE | INTRAVENOUS | Status: AC
  Filled 2021-10-03: qty 10

## 2021-10-03 MED ORDER — ACETAMINOPHEN 650 MG RE SUPP
650.0000 mg | Freq: Once | RECTAL | Status: AC
  Administered 2021-10-03: 650 mg via RECTAL
  Filled 2021-10-03: qty 1

## 2021-10-03 MED ORDER — VANCOMYCIN HCL 2000 MG/400ML IV SOLN
2000.0000 mg | Freq: Once | INTRAVENOUS | Status: AC
  Administered 2021-10-03: 2000 mg via INTRAVENOUS
  Filled 2021-10-03 (×2): qty 400

## 2021-10-03 NOTE — Progress Notes (Signed)
Patient seen today by trach team for consult.  No education is needed at this time.  All necessary equipment is at beside.   Will continue to follow for progression.  

## 2021-10-03 NOTE — Sepsis Progress Note (Signed)
Notified bedside nurse of need to draw repeat lactic acid. 

## 2021-10-03 NOTE — Progress Notes (Signed)
NAME:  Troy Randall, MRN:  [id number], DOB:  1960-02-09, LOS: 29 ADMISSION DATE:  09/04/2021, CONSULTATION DATE:  10/03/2021 REFERRING MD: Julian Reil - [name], CHIEF COMPLAINT:  Aspiration PNA  History of Present Illness:   60 year old man who presented to [facility] 8/31 with melena. On admission, patient reported development of frequent (6-7) dark stools with associated dizziness/lightheadedness.  FOBT positive and initial Hgb 12.2. PMHx significant for HTN, HLD, DM, and CVA (on ASA/Plavix).  Patient was initially admitted to [facility] 8/31 with GI consulting. Remained orthostatic with progressive decline in Hgb.  CTA Abdomen 9/1 did not show any active bleed, therefore underwent colonoscopy 9/2 without active bleed found, but large clots noted throughout the entire colon.  Subsequently underwent EGD 9/2 which showed a small hiatal hernia, erosive gastropathy with no stigmata of recent bleeding and non-bleeding duodenal diverticulum.   Patient then underwent small bowel enteroscopy 9/2 which noted intermittent blood pumping in the proximal jejunum; unfortunately this was unable to be reached despite multiple attempts, it was injected and clip placed.  Repeat CTA GIB demonstrated positive jejunal diverticular bleeding. Patient was transferred to [facility] for IR involvement. In IR 9/2, active extravasation was noted in the territory of the SMA, jejunal arcade branch, successful coil embolization.  PCCM was consulted 9/2 for ICU transfer/admission. Patient was intubated 9/3 in the setting of worsening clinical status and repeat CTA was completed and negative for bleed, but noting inflamed, air-filled gallbladder for which IR placed percutaneous cholecystostomy tube 9/3. Patient continued to worsen clinically and CRRT was initiated via RIJ Trialysis catheter. Repeat EGD was completed 9/6 and negative for further bleeding. Patient eventually required percutaneous tracheostomy for prolonged intubation (completed 9/11). Patient  underwent MRI 9/16 in the setting of AMS/neurologic changes demonstrating rapid development of white matter lesions; this was repeated 9/17 with contrast with lesions c/f active demyelination. LP ws completed 9/18 with low suspicion for infection. Patient was transferred out of ICU to [name]/s service 9/19 with PCCM following for tracheostomy.  On 9/29, Rapid Response called for tachypnea, respiratory distress. Reconsulted for ICU transfer after severe aspiration event on the floor.  Pertinent Medical History:  HTN, HLD, DM, CVA on ASA/Plavix  Significant Hospital Events: Including procedures, antibiotic start and stop dates in addition to other pertinent events   8/31 presented to [facility] ED, [name] Admit, Gi Consult 9/1 CTA> no evidence of active GI bleed, 2 U PRBC 9/2 2 U PRBC, colonoscopy > no active bleed found but large clots throughout entire colon. EGD> small hiatal hernia, erosive gastropathy with no stigmata of recent bleeding, and non-bleeding duodenal diverticulum.   Underwent then small bowel enteroscopy which noted intermittent blood pumping in the proximal jejunum but was unable to be reached despite multiple attempts, was injected and clip placed. Repeat CTA repeated, which showed positive jejunal diverticular bleeding. IR> active extrav at [facility] territory, jejunal arcade branch, successful coil embo 9/3 Intubated. CTA> no evidence of active GI bleeding.  New finding of abnormal gallbladder with nondependent air, gallbladder wall air, pericholecystic and right upper quadrant inflammation.  IR placed percutaneous cholecystotomy tube.  9/4 R. IJ central line placed with dialysis triple lumen catheter 9/5 CRRT initiated 9/6 Bedside EGD without any evidence of bleeding. CTA a/p without etiology for bleed 9/8 CRRT discontinued. Required ETT exchange yesterday due to blown cuff. 9/11 Bronchoscopic guided percutaneous tracheostomy placement.  Right IJ central line removed. 9/15 flexible sigmoidoscopy  with removal of endoscopy 9/16 MRI brain w/o contrast amended study secondary to range of  motion.  Rapid development numerous round white matter lesions in both cerebral hemispheres.  Prior MRIs. 9/17 MRI brain with contrast numerous contrast-enhancing bilateral white matter lesions consistent with active demyelination; steroid course completed. 9/18 Lumbar puncture  9/19 Transfer out of ICU 9/29 Rapid Response called for tachypnea, respiratory distress. Reconsult for ICU transfer after severe aspiration event on the floor.  Interim History / Subjective:  Called to bedside 9/29AM for rapid response for increased WOB, witnessed aspiration event On Rapid Response RN's arrival, patient was febrile to 103.2 (axillary), tachycardic to 130s and tachypneic to 30s, normotensive, SpO2 98% on ATC (5L, FiO2 28%) Patient noted to be coughing with moderate green-appearing (?bilious versus purulent) fluid coming from tracheostomy site Patient became hypoxic to 88%, O2 increased to 10L/FiO2 40% ABG 7.47/36.1/171/26.1, 100% WBC 15.9 (11.2) CXR with persistent LLL PNA versus aspiration PCCM called to evaluate for ICU transfer  Objective:  Blood pressure (!) 140/67, pulse (!) 136, temperature (!) 103.2 F (39.6 C), temperature source Axillary, resp. rate (!) 31, height 6' (1.829 m), weight 125.4 kg, SpO2 93 %. FiO2 (%):  [28 %-40 %] 40 %   Intake/Output Summary (Last 24 hours) at 10/03/2021 8099 Last data filed at 10/02/2021 0900 Gross per 24 hour  Intake --  Output 250 ml  Net -250 ml    Filed Weights   09/27/21 0500 09/30/21 0500 10/01/21 0500  Weight: 123.5 kg 126.1 kg 125.4 kg   Physical Examination: General: Acute-on-chronically ill-appearing middle-aged man in NAD. Appears uncomfortable. HEENT: [place]/AT, anicteric sclera, pupils equal/round 2.53mm, sluggishly reactive to light, moist mucous membranes. Tracheostomy in place on trach collar with thick, tan secretions. Neuro:  Awake, eyes open,  somewhat responsive to voice, does not track examiner.  Responds to noxious stimuli. Withdraws to pain in all 4 extremities. Not following commands. Moves all 4 extremities spontaneously. +Corneal, +Cough, and +Gag  CV: Tachycardic to 130s, no m/g/r. PULM: Tachypneic with mildly labored breathing on ATC (10L, FiO2 40%). Lung fields coarse throughout, diminished bilaterally L > R. GI: Soft, nontender, nondistended. Hypoactive bowel sounds. Percutaneous cholecystostomy in place. Extremities: Trace BLE edema noted. Skin: Warm/dry, no rashes.  Resolved Hospital Problem List:   AKI  - off CRRT 9/8 Bilateral nonobstructing nephrolithiasis - suspect chronic BPH Hypernatremia Septic shock Hemorrhagic shock - resolved ABLA secondary to lower GI bleed, jejunal diverticular and ulcers secondary to ischemia Acute blood loss and iron deficiency anemia, secondary to above - 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 and bedside EGD 9/6. Endoscopy capsule removal 9/15  Assessment & Plan:   Acute aspiration event 9/29 with c/f aspiration PNA Acute hypoxemic respiratory failure s/p percutaneous tracheostomy 9/11 S/p tracheostomy 9/11. PCCM had been following weekly for trach, called back 9/29AM for respiratory distress/increased WOB in the setting of almost certain aspiration event. - Continue supplemental O2 support/ATC as tolerated - May require return to full vent support for airway protection if ongoing concern for aspiration - Wean FiO2 for O2 sat > 90% - If returning to vent, daily WUA/SBT once clinically appropriate - Trach care per protocol - VAP bundle - Pulmonary hygiene - PAD protocol if returning to vent, anxiolytics PRN - Trend WBC, LA - F/u Resp Cx/TA, Bcx, legionella/urine strep - Broad-spectrum antibiotic coverage with cefepime, vanc; narrow as able  Acute encephalopathy, possibly secondary to hypoxic-ischemic encephalopathy History of CVA 04/2021  without residual  deficits, concern for recurrence - Neurology following - S/p Solumedrol x 5-day course - PLEX per Neuro, access  for this may need to be delayed until more stable  E. coli Cholecystitis s/p percutaneous cholecystostomy drain placement 9/3 Percutaneous cholecystostomy drain placed and continues to have adequate drainage. - S/p ceftriaxone x 7-day course - Antibiotics (broad-spectrum) resumed as above - Will eventually need outpatient surgery/IR follow up  T2DM - Basal Semglee 44U BID - SSI - CBGs Q4H - Goal CBG 140-180  Pulmonary nodules in bilateral bases, L 60mm Family reports patient is a never smoker. Family does endorse dipping. - No further f/u needed; patient is low risk per Fleischner criteria  Best Practice: (right click and "Reselect all SmartList Selections" daily)   Diet/type: NPO DVT prophylaxis: SCDs (no AC in the setting of recent GIB) GI prophylaxis: PPI Central venous access:  Yes, and it is still needed Foley:  N/A Code Status:  full code Last date of multidisciplinary goals of care discussion [Pending]  Critical care time:   The patient is critically ill with multiple organ system failure and requires high complexity decision making for assessment and support, frequent evaluation and titration of therapies, advanced monitoring, review of radiographic studies and interpretation of complex data.   Critical Care Time devoted to patient care services, exclusive of separately billable procedures, described in this note is 35 minutes.  Tim Lair, PA-C [facility] 10/03/21 6:27 AM  Please see [email or web address] for pager details.  From 7A-7P if no response, please call [phone] After hours, please call ELink [phone]

## 2021-10-03 NOTE — Progress Notes (Signed)
Neurology Progress Note  Subjective: Patient with concern for sepsis overnight- transferred to ICU with a TMAX of 103.2 F and tachycardia with a heart rate into the 140's Wife at bedside with reports of concern for bile orally and bilious discharge from patient's tracheostomy.  MRI attempted yesterday with 2 mg IV lorazepam. Per MRI tech, patient was unable to sit still for MRI imaging. Sedation re-ordered per primary team and will re-attempt imaging today. MRI notified  Infectious work up per primary team pending   Objective: Current vital signs: BP 115/79   Pulse (!) 132   Temp (!) 102.2 F (39 C) (Axillary)   Resp (!) 29   Ht 6' (1.829 m)   Wt 124.1 kg   SpO2 99%   BMI 37.11 kg/m  Vital signs in last 24 hours: Temp:  [98.3 F (36.8 C)-103.2 F (39.6 C)] 102.2 F (39 C) (09/29 0734) Pulse Rate:  [96-142] 132 (09/29 0900) Resp:  [20-31] 29 (09/29 0900) BP: (109-141)/(42-85) 115/79 (09/29 0900) SpO2:  [87 %-100 %] 99 % (09/29 0900) FiO2 (%):  [28 %-100 %] 35 % (09/29 0800) Weight:  [124.1 kg] 124.1 kg (09/29 0643)  Intake/Output from previous day: 09/28 0701 - 09/29 0700 In: -  Out: 250 [Urine:250] Intake/Output this shift: No intake/output data recorded. Nutritional status:  Diet Order             Diet NPO time specified  Diet effective now                  HEENT: [place]/AT. Trach collar is noted with nonbloody secretions Ext: No edema   Neurologic Exam: Ment: Awake with eyes open, looking upward.  Intermittently tracks examiner, more on the right today.  Still with no attempts to speak. Able to move all extremities without gravity but does not move them to command.  Does not nod to answer questions today.  CN: PERRL. Fixates briefly. Eyes are conjugate. Will intermittently track over a limited range of EOM. Face is symmetric.  Motor/Sensory: Spontaneous movement without gravity throughout. Withdraws to noxious stimuli briskly throughout. No spontaneous  antigravity movement noted today, does not follow commands, does not participate in confrontational strength testing. Cerebellar/Gait: Unable to assess  Lab Results: Results for orders placed or performed during the hospital encounter of 09/04/21 (from the past 48 hour(s))  Glucose, capillary     Status: Abnormal   Collection Time: 10/01/21 12:15 PM  Result Value Ref Range   Glucose-Capillary 215 (H) 70 - 99 mg/dL    Comment: Glucose reference range applies only to samples taken after fasting for at least 8 hours.  Glucose, capillary     Status: Abnormal   Collection Time: 10/01/21  5:02 PM  Result Value Ref Range   Glucose-Capillary 183 (H) 70 - 99 mg/dL    Comment: Glucose reference range applies only to samples taken after fasting for at least 8 hours.  Glucose, capillary     Status: Abnormal   Collection Time: 10/01/21  7:55 PM  Result Value Ref Range   Glucose-Capillary 177 (H) 70 - 99 mg/dL    Comment: Glucose reference range applies only to samples taken after fasting for at least 8 hours.  Glucose, capillary     Status: Abnormal   Collection Time: 10/02/21  1:20 AM  Result Value Ref Range   Glucose-Capillary 238 (H) 70 - 99 mg/dL    Comment: Glucose reference range applies only to samples taken after fasting for at least 8 hours.  Magnesium     Status: None   Collection Time: 10/02/21  3:55 AM  Result Value Ref Range   Magnesium 2.2 1.7 - 2.4 mg/dL    Comment: Performed at [facility], [place] [address], [place], [place] [address]  Phosphorus     Status: None   Collection Time: 10/02/21  3:55 AM  Result Value Ref Range   Phosphorus 2.7 2.5 - 4.6 mg/dL    Comment: Performed at [facility] [address], [place], [place] [address]  CBC with Differential/Platelet     Status: Abnormal   Collection Time: 10/02/21  3:55 AM  Result Value Ref Range   WBC 11.2 (H) 4.0 - 10.5 K/uL   RBC 3.58 (L) 4.22 - 5.81 MIL/uL   Hemoglobin 10.2 (L) 13.0 - 17.0 g/dL   HCT 31.2  (L) 39.0 - 52.0 %   MCV 87.2 80.0 - 100.0 fL   MCH 28.5 26.0 - 34.0 pg   MCHC 32.7 30.0 - 36.0 g/dL   RDW 14.6 11.5 - 15.5 %   Platelets 156 150 - 400 K/uL   nRBC 0.0 0.0 - 0.2 %   Neutrophils Relative % 78 %   Neutro Abs 8.7 (H) 1.7 - 7.7 K/uL   Lymphocytes Relative 11 %   Lymphs Abs 1.2 0.7 - 4.0 K/uL   Monocytes Relative 6 %   Monocytes Absolute 0.7 0.1 - 1.0 K/uL   Eosinophils Relative 3 %   Eosinophils Absolute 0.4 0.0 - 0.5 K/uL   Basophils Relative 0 %   Basophils Absolute 0.0 0.0 - 0.1 K/uL   Immature Granulocytes 2 %   Abs Immature Granulocytes 0.22 (H) 0.00 - 0.07 K/uL    Comment: Performed at [facility] [address] [place], [place] [address]  Glucose, capillary     Status: Abnormal   Collection Time: 10/02/21  5:58 AM  Result Value Ref Range   Glucose-Capillary 177 (H) 70 - 99 mg/dL    Comment: Glucose reference range applies only to samples taken after fasting for at least 8 hours.  Glucose, capillary     Status: Abnormal   Collection Time: 10/02/21  9:42 AM  Result Value Ref Range   Glucose-Capillary 240 (H) 70 - 99 mg/dL    Comment: Glucose reference range applies only to samples taken after fasting for at least 8 hours.  Glucose, capillary     Status: Abnormal   Collection Time: 10/02/21 11:56 AM  Result Value Ref Range   Glucose-Capillary 267 (H) 70 - 99 mg/dL    Comment: Glucose reference range applies only to samples taken after fasting for at least 8 hours.  Glucose, capillary     Status: Abnormal   Collection Time: 10/02/21  3:53 PM  Result Value Ref Range   Glucose-Capillary 205 (H) 70 - 99 mg/dL    Comment: Glucose reference range applies only to samples taken after fasting for at least 8 hours.  Glucose, capillary     Status: Abnormal   Collection Time: 10/02/21 10:14 PM  Result Value Ref Range   Glucose-Capillary 155 (H) 70 - 99 mg/dL    Comment: Glucose reference range applies only to samples taken after fasting for at least 8 hours.   Glucose, capillary     Status: Abnormal   Collection Time: 10/03/21  3:50 AM  Result Value Ref Range   Glucose-Capillary 145 (H) 70 - 99 mg/dL    Comment: Glucose reference range applies only to  samples taken after fasting for at least 8 hours.  Magnesium     Status: None   Collection Time: 10/03/21  4:29 AM  Result Value Ref Range   Magnesium 1.8 1.7 - 2.4 mg/dL    Comment: Performed at [facility], [address], [place], [place] [address]  Phosphorus     Status: None   Collection Time: 10/03/21  4:29 AM  Result Value Ref Range   Phosphorus 3.0 2.5 - 4.6 mg/dL    Comment: Performed at [facility], [address], [place], [place] [address]  CBC with Differential/Platelet     Status: Abnormal   Collection Time: 10/03/21  4:29 AM  Result Value Ref Range   WBC 15.9 (H) 4.0 - 10.5 K/uL   RBC 3.88 (L) 4.22 - 5.81 MIL/uL   Hemoglobin 11.3 (L) 13.0 - 17.0 g/dL   HCT 56.3 (L) 14.9 - 70.2 %   MCV 88.9 80.0 - 100.0 fL   MCH 29.1 26.0 - 34.0 pg   MCHC 32.8 30.0 - 36.0 g/dL   RDW 63.7 85.8 - 85.0 %   Platelets 183 150 - 400 K/uL   nRBC 0.0 0.0 - 0.2 %   Neutrophils Relative % 91 %   Neutro Abs 14.5 (H) 1.7 - 7.7 K/uL   Lymphocytes Relative 3 %   Lymphs Abs 0.5 (L) 0.7 - 4.0 K/uL   Monocytes Relative 3 %   Monocytes Absolute 0.4 0.1 - 1.0 K/uL   Eosinophils Relative 1 %   Eosinophils Absolute 0.1 0.0 - 0.5 K/uL   Basophils Relative 0 %   Basophils Absolute 0.0 0.0 - 0.1 K/uL   Immature Granulocytes 2 %   Abs Immature Granulocytes 0.30 (H) 0.00 - 0.07 K/uL    Comment: Performed at [facility], [address] [place], [place] [address]  Basic metabolic panel     Status: Abnormal   Collection Time: 10/03/21  4:29 AM  Result Value Ref Range   Sodium 140 135 - 145 mmol/L   Potassium 3.9 3.5 - 5.1 mmol/L   Chloride 106 98 - 111 mmol/L   CO2 27 22 - 32 mmol/L   Glucose, Bld 147 (H) 70 - 99 mg/dL    Comment: Glucose reference range applies only to samples taken  after fasting for at least 8 hours.   BUN 17 6 - 20 mg/dL   Creatinine, Ser 2.87 0.61 - 1.24 mg/dL   Calcium 8.2 (L) 8.9 - 10.3 mg/dL   GFR, Estimated >86 >76 mL/min    Comment: (NOTE) Calculated using the CKD-EPI Creatinine Equation (2021)    Anion gap 7 5 - 15    Comment: Performed at [facility], [address] [place], [place] [address]  I-STAT 7, (LYTES, BLD GAS, ICA, H+H)     Status: Abnormal   Collection Time: 10/03/21  5:46 AM  Result Value Ref Range   pH, Arterial 7.476 (H) 7.35 - 7.45   pCO2 arterial 36.1 32 - 48 mmHg   pO2, Arterial 171 (H) 83 - 108 mmHg   Bicarbonate 26.1 20.0 - 28.0 mmol/L   TCO2 27 22 - 32 mmol/L   O2 Saturation 100 %   Acid-Base Excess 3.0 (H) 0.0 - 2.0 mmol/L   Sodium 139 135 - 145 mmol/L   Potassium 3.9 3.5 - 5.1 mmol/L   Calcium, Ion 1.14 (L) 1.15 - 1.40 mmol/L   HCT 33.0 (L) 39.0 - 52.0 %  Hemoglobin 11.2 (L) 13.0 - 17.0 g/dL   Patient temperature 103.2 F    Sample type ARTERIAL   Glucose, capillary     Status: None   Collection Time: 10/03/21  6:41 AM  Result Value Ref Range   Glucose-Capillary 90 70 - 99 mg/dL    Comment: Glucose reference range applies only to samples taken after fasting for at least 8 hours.  Glucose, capillary     Status: None   Collection Time: 10/03/21  7:31 AM  Result Value Ref Range   Glucose-Capillary 94 70 - 99 mg/dL    Comment: Glucose reference range applies only to samples taken after fasting for at least 8 hours.  Lactic acid, plasma     Status: Abnormal   Collection Time: 10/03/21  8:16 AM  Result Value Ref Range   Lactic Acid, Venous 2.1 (HH) 0.5 - 1.9 mmol/L    Comment: CRITICAL RESULT CALLED TO, READ BACK BY AND VERIFIED WITH Sophronia Simas, RN [phone] 10/03/21 L. KLAR Performed at [facility], [place] [address] [place], [place] [address]   Protime-INR     Status: None   Collection Time: 10/03/21  8:16 AM  Result Value Ref Range   Prothrombin Time 13.5 11.4 - 15.2 seconds   INR 1.0 0.8 - 1.2     Comment: (NOTE) INR goal varies based on device and disease states. Performed at [facility], [place] [address], [place], [place] [address]   APTT     Status: Abnormal   Collection Time: 10/03/21  8:16 AM  Result Value Ref Range   aPTT <20 (L) 24 - 36 seconds    Comment: REPEATED TO VERIFY SPECIMEN CHECKED FOR CLOTS Performed at [facility], [place] [address] [place], [place] [address]   Lipase, blood     Status: None   Collection Time: 10/03/21  8:16 AM  Result Value Ref Range   Lipase 42 11 - 51 U/L    Comment: Performed at [facility] [address] [place], [place] [address]   No results found for this or any previous visit (from the past 240 hour(s)).  Lipid Panel No results for input(s): "CHOL", "TRIG", "HDL", "CHOLHDL", "VLDL", "LDLCALC" in the last 72 hours.  Studies/Results: DG Abd 1 View  Result Date: 10/03/2021 CLINICAL DATA:  Aspiration into airway.  Feeding tube. EXAM: ABDOMEN - 1 VIEW COMPARISON:  09/16/2021 FINDINGS: Feeding tube tip is identified within the distal stomach. Percutaneous pigtail drainage catheter overlies the right upper quadrant of the abdomen. No dilated small bowel loops. Gaseous distension of the colon is noted which appears improved compared with 09/16/2021. IMPRESSION: 1. Feeding tube tip within the distal stomach. 2. Improved gaseous distension of the colon. Electronically Signed   By: Kerby Moors M.D.   On: 10/03/2021 06:32   DG CHEST PORT 1 VIEW  Result Date: 10/03/2021 CLINICAL DATA:  Aspiration. EXAM: PORTABLE CHEST 1 VIEW COMPARISON:  09/15/2021 FINDINGS: There is a right arm PICC line with tip in the cavoatrial junction. Tracheostomy tube tip is above the carina. There is a feeding tube with tip below the GE junction. Stable cardiomediastinal contours. The lungs are suboptimally inflated. Asymmetric elevation of the right hemidiaphragm noted. Asymmetric airspace opacities identified within the left lower lobe concerning  for pneumonia or aspiration. IMPRESSION: 1. Persistent asymmetric airspace opacities within the left lower lobe concerning for pneumonia and or aspiration. 2. Stable support apparatus. Electronically Signed   By: Kerby Moors  M.D.   On: 10/03/2021 06:31    Medications: Scheduled:  aspirin  81 mg Per Tube Daily   Chlorhexidine Gluconate Cloth  6 each Topical Daily   docusate  100 mg Per Tube Daily   feeding supplement (PROSource TF20)  60 mL Per Tube BID   free water  400 mL Per Tube Q6H   influenza vac split quadrivalent PF  0.5 mL Intramuscular Tomorrow-1000   insulin aspart  0-15 Units Subcutaneous Q4H   insulin aspart  0-5 Units Subcutaneous QHS   insulin glargine-yfgn  25 Units Subcutaneous BID   mouth rinse  15 mL Mouth Rinse Q2H   pantoprazole  40 mg Per Tube BID   pneumococcal 23 valent vaccine  0.5 mL Intramuscular Tomorrow-1000   polyethylene glycol  17 g Per Tube Daily   rosuvastatin  40 mg Per Tube Daily   sodium chloride flush  10-40 mL Intracatheter Q12H   sodium chloride flush  5 mL Intracatheter Q8H   Zinc Oxide   Topical BID   Continuous:  sodium chloride Stopped (09/15/21 1519)   sodium chloride 250 mL (10/03/21 0646)   ceFEPime (MAXIPIME) IV     ceFEPime (MAXIPIME) IV 2 g (10/03/21 0824)   dexmedetomidine (PRECEDEX) IV infusion     dextrose 40 mL/hr at 10/03/21 [id number]   feeding supplement (VITAL 1.5 CAL) 1,000 mL (10/01/21 0249)   lactated ringers     lactated ringers     vancomycin     vancomycin 2,000 mg (10/03/21 0946)   Assessment: 61 year old patient with history of stroke in April of this year with minimal residual deficits, HTN, HLD, DM and recent jejunal bleed, presented with continued AMS and concerning multifocal enhancing white matter lesions on MRI that exhibit morphologies and a distribution suggestive of MS or ADEM, with vasculitis also a consideration, but lower on the differential diagnosis. He has no prior history of demyelinating disease and wife  states no prior episodes of focal weakness or vision loss. Also with no family history of MS. Spine imaging negative for cord lesions. Has completed a 5 day course of IV Solumedrol as empiric treatment for the lesions without improvement in presentation. - Serial exam findings: - Exam today: Does not follow commands.  Spontaneous movements without gravity throughout as well as withdraw to noxious stimuli.   Does not attempt to speak.  Per wife at bedside, patient remains significantly altered from baseline. - 9/29 Concern for sepsis overnight with febrile illness TMAX 103.2 F and tachycardia with a heart rate into the 140's. CXR with concern for airspace opacities within the LLL concerning for pneumonia and/or aspiration.  - Imaging: - White matter changes on MRI with multiple foci of bland restricted diffusion may represent strokes, although pattern would be unusual. The locations and morphologies of the lesions could be consistent with demyelinating disease, but the enhancing pattern would be atypical. Infectious etiology unlikely as there is no perilesional edema or necrotic core associated with the lesions. - MRI brain follow up post-contrast imaging reveals >20 contrast enhancing lesions in the white matter bilaterally. Strongly favor inflammation / active demyelination possibly MS vs ADEM on initial interpretation. Based on personal review of the images by they seem more complatible with ADEM or a vasculitic process than with MS. Sequela of septic shock is suspected, but I could not find any literature with specific brain imaging to support or refute this component of the DDx.  - MRI of cervical and thoracic spine w/wo contrast: Markedly  limited exam for assessment of the spinal cord due to the degree of motion artifact. Within this limitation, there is no definite evidence of active demyelination. Within limitations of motion artifact, there may be demyelinating lesions at the C6 and T1 vertebral body  levels. Repeat exam is recommended if more definitive characterization as clinically warranted. - Inflammation favored over infection given that he has been afebrile and primarily altered but not ill-appearing therefore no indication for empiric CNS coverage at this time.  - LP was performed by CCM on 9/18. CSF findings: - Glucose 126, RBC count 1-5 (unremarkable), clear, colorless, total protein normal, WBC normal.  - Gram stain: Negative - Fungal and bacterial cultures pending - Infectious disease Biofire panel negative for all pathogens tested, including cryptococcus, VZV and HSV.  - Cryptococcal Ag negative.  - IgG index is normal at 0.5.  - OCBs testing has resulted: No oligoclonal bands seen - Serum labs:  - Vitamin B12 level consistent with supplementation. Mildly elevated Na. BUN 30. ALT 48. TSH normal. ANA negative.  - C3 complement level elevated at 184. Autoimmune panel otherwise negative.  - HIV screen negative.  - DDx for AMS: May be secondary to a CNS inflammatory process as evidenced by the enhancing lesions seen on brain MRI. Inflammation may not be restricted only to the regions of enhancement visible on MRI. Sequelae of end organ damage from his septic shock is also on the DDx. 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  - DDx for white matter lesions on MRI: ADEM, multiple sclerosis and cerebral vasculitis (isolated or as a component of a possible systemic vasculitis) are felt to be highest on the DDx. He did have matched bands on testing for oligoclonal bands. Which suggests peripheral antibody production but could also be related to his recent cholecystitis. CT angio head and neck not concerning for LVO or vasculitis. - Assessment of response to 5 day course of pulsed-dose steroids (completed on Saturday 9/23): fluctuating exam but at most had a  gradual improvement over the 5 day period but he still remains significantly altered from his baseline.   Recommendations: - OOB to chair when possible to reduce the risk of developing an ICU neuropathy or myopathy - Repeat MRI brain, cervical, thoracic spine w/wo contrast. Patient unable to tolerate imaging 9/28, increased sedation ordered today per PCCM, will re-attempt imaging today per MRI.  - Follow up neuroimaging as it is complete - Discussed PLEX therapy with patient's wife at bedside. Will do PLEX x 5 sessions. PLEX orders placed and IR consulted for temp HD cath placement. Placement will be postponed at this time until patient is stabilized from an infectious standpoint per IR MD. No ACE inhibitors and no ARBs while on PLEX.   LOS: 29 days   -- Anibal Henderson, AGACNP-BC [facility] [phone]   NEUROHOSPITALIST ADDENDUM Performed a face to face diagnostic evaluation.   I have reviewed the contents of history and physical exam as documented by PA/ARNP/Resident and agree with above documentation.  I have discussed and formulated the above plan as documented. Edits to the note have been made as needed.  Impression/Key exam findings/Plan: in the light of new sepsis, will need to postpone HD cath placement and PLEX. Will try to get MRI with precedex and versed in the meantime. No clinical improvement now 7 days after completion of IV solumedrol.  Donnetta Simpers, MD  [facility] [phone]   If 7pm to 7am, please call on call as listed on AMION.

## 2021-10-03 NOTE — Progress Notes (Signed)
Pharmacy Antibiotic Note  Troy Randall is a 60 y.o. male s/p tracheostomy with respiratory distress/aspiration Pharmacy has been consulted for Vancomycin and Cefepime  dosing.  Plan: Vancomycin 2000 mg IV now, then Vancomycin 1500 mg IV q12h Cefepime 2 g IV q8h  Height: 6' (182.9 cm) Weight: 125.4 kg (276 lb 7.3 oz) IBW/kg (Calculated) : 77.6  Temp (24hrs), Avg:100.2 F (37.9 C), Min:98.5 F (36.9 C), Max:103.2 F (39.6 C)  Recent Labs  Lab 09/28/21 0440 09/29/21 0426 09/30/21 0330 10/01/21 0359 10/02/21 0355 10/03/21 0429  WBC 14.7* 13.8* 11.8* 12.5* 11.2* 15.9*  CREATININE 0.78 0.72 0.63 0.65  --  0.77    Estimated Creatinine Clearance: 136 mL/min (by C-G formula based on SCr of 0.77 mg/dL).    Allergies  Allergen Reactions   Metformin And Related     GI upset   Penicillins Hives   Caryl Pina 10/03/2021 6:07 AM

## 2021-10-03 NOTE — Significant Event (Signed)
Rapid Response Event Note   Reason for Call :  Called by provider to assess pt who is now tachycardic.   Initial Focused Assessment:  On arrival, pt laying in bed alert but not able to follow commands. He is restless with occasional kicking of BLE off the bed. Per wife at bedside this is not new for him. HR 135, BP 137/73, RR 24, spO2 98% on 5L 28% TC. Temp 103.43F axillary  Lungs Diminished throughout.   Pt had coughing episode with moderate amount of thin, clear, green (bile?) from trach site. spO2 dropped to 88%, O2 increased to 10L, 40%. Md notified. Suctioning performed. E-link notified     Interventions:  EKG- ST with PACs, rate 135 Trach suctioning with thick yellow/tan sputum removed O2 increased to 10L 40% Tylenol Suppository, ice packs CXR, KUB  Abg Sepsis protocol ordered CCM Notified- Transferred to ICU    Plan of Care:  Pt transferred to 2M07   Event Summary:   MD Notified: Rich PTA, CCM Call [place] Time: Leon End Time: 2671  Sherilyn Dacosta, RN

## 2021-10-03 NOTE — Sepsis Progress Note (Signed)
Elink following Code Sepsis. 

## 2021-10-03 NOTE — Progress Notes (Signed)
Brought pt to MRI yesterday(9/28) for another attempt to complete his exams, but pt was flailing around, throwing his arms above his head, and slinging his legs off his bed. Pt too combative to even get onto table.

## 2021-10-03 NOTE — Sepsis Progress Note (Signed)
Notified bedside nurse of need to draw lactic acid & Blood cultures before the administration of abx.

## 2021-10-03 NOTE — Progress Notes (Signed)
Rt called to pt. Room due to increased WOB, tachycardia, and tachypnea. Also trach bile like  Abg done Results. increased oxygen to 10L 40%  ABG results: Ph: 7.47 Pco2: 36.1 Po2: 171 BE: 3 HCO3:26.1 TCO2: 27 S02%: 100%

## 2021-10-03 NOTE — Progress Notes (Signed)
OT Cancellation Note  Patient Details Name: Troy Randall MRN: [id number] DOB: [birth year]   Cancelled Treatment:    Reason Eval/Treat Not Completed: Fatigue/lethargy limiting ability to participate (also with BP of 60/47) Will continue to follow.   [name] 10/03/2021, 3:36 PM Cleta Alberts, OTR/L [facility] Office: [phone]

## 2021-10-03 NOTE — Progress Notes (Signed)
OT Cancellation Note  Patient Details Name: Troy Randall MRN: [id number] DOB: Jun 05, 1960   Cancelled Treatment:    Reason Eval/Treat Not Completed: Patient at procedure or test/ unavailable (Pt in MRI.)  [name] 10/03/2021, 1:25 PM Cleta Alberts, OTR/L [place] Office: [phone]

## 2021-10-03 NOTE — Progress Notes (Addendum)
OVERNIGHT PROGRESS REPORT  Notified by RN for Tachycardia/Tachypnea/ restlessness.  Significant events. 8/31 presented to [facility], [name] Admit, Gi Consult 9/1 CTA> no evidence of active GI bleed, 2 U PRBC 9/2 2 U PRBC, colonoscopy > no active bleed found but large clots throughout entire colon. EGD> small hiatal hernia, erosive gastropathy with no stigmata of recent bleeding, and non-bleeding duodenal diverticulum.   Underwent then small bowel enteroscopy which noted intermittent blood pumping in the proximal jejunum but was unable to be reached despite multiple attempts, was injected and clip placed. Repeat CTA repeated, which showed positive jejunal diverticular bleeding. IR> active extrav at [facility] territory, jejunal arcade branch, successful coil embo 9/3 Intubated. CTA> no evidence of active GI bleeding.  New finding of abnormal gallbladder with nondependent air, gallbladder wall air, pericholecystic and right upper quadrant information.  IR placed percutaneous cholecystotomy tube.  9/4 R. IJ central line placed with dialysis triple lumen catheter 9/5 CRRT initiated 9/6 Bedside EGD without any evidence of bleeding. CTA a/p without etiology for bleed 9/8 CRRT discontinued. Required ETT exchange yesterday due to blown cuff. 9/11 Bronchoscopic guided percutaneous tracheostomy placement.  Right IJ central line removed. 9/15 flexible sigmoidoscopy with removal of endoscopy 9/16 MRI brain w/o contrast amended study secondary to range of motion.  Rapid development numerous round white matter lesions in both cerebral hemispheres.  Prior MRIs. 9/17 MRI brain: Numerous contrast-enhancing bilateral white matter lesions, consistent with active demyelination. 9/18 Lumbar puncture  9/19 MRI cervical spine, thoracic : Limited exam, within limitations there is not definitive evidence of active demyelinization. Within limitations of motion artifact, there may be demyelinating lesions at the C6 and T1 vertebral  body levels. -S/p  LP on 9/18. 9/25-clinical exam not improved despite completion of 5-day IV steroids.  Neurology planning to repeat MRI of the brain cervical spine thoracic spine. Possible IR consult for catheter placement for possible Plex therapy if abnormal scans 9/26- scans attempted with ativan  Overnight 10/02/21- 0530 Hrs  Notified by RN for tachycardia/ Tachypnea/restlessness.  NP Notified Rapid response due to extensive history and path of this hospital stay and concern for aspiration.  NP Notified Floor coverage physician to see patient.  Found to have large volume of contents eminating from trach/mouth with suctioning by Rapid Response RN and RT.    Notified floor coverage physician of need to see patient.  Remote floor coverage NP (author) Notified E-Link for possible aspiration/ sepsis concern and possible ICU requirement due to Vital signs and elevated temperature.    CCM is notified to see patient for evaluation for evaluation/transfer (pending) [place] Floor coverage Physician at bedside recommended ICU.  Labs, CXR,KUB, medications ordered - pending.  E-Link following  * Code Sepsis * 2.   Transfer to ICU. 3.   Code Sepsis 4.   Possible Aspiration   Update 0607 hrs   Latest Reference Range & Units 10/03/21 04:29  WBC 4.0 - 10.5 K/uL 15.9 (H)  RBC 4.22 - 5.81 MIL/uL 3.88 (L)  Hemoglobin 13.0 - 17.0 g/dL 11.3 (L)  HCT 39.0 - 52.0 % 34.5 (L)  MCV 80.0 - 100.0 fL 88.9  MCH 26.0 - 34.0 pg 29.1  MCHC 30.0 - 36.0 g/dL 32.8  RDW 11.5 - 15.5 % 15.0  Platelets 150 - 400 K/uL 183  (H): Data is abnormally high (L): Data is abnormally low    Latest Reference Range & Units 10/03/21 05:46  Sample type  ARTERIAL  pH, Arterial 7.35 - 7.45  7.476 (  H)  pCO2 arterial 32 - 48 mmHg 36.1  pO2, Arterial 83 - 108 mmHg 171 (H)  TCO2 22 - 32 mmol/L 27  Acid-Base Excess 0.0 - 2.0 mmol/L 3.0 (H)  Bicarbonate 20.0 - 28.0 mmol/L 26.1  O2 Saturation % 100  Patient temperature  103.2  F  (H): Data is abnormally high  Others pending   Patient will be ICU/ CCM transfer.   Update 0642 hrs     10/03/21   CLINICAL DATA:  Aspiration into airway.  Feeding tube.   EXAM: ABDOMEN - 1 VIEW   COMPARISON:  09/16/2021   FINDINGS: Feeding tube tip is identified within the distal stomach. Percutaneous pigtail drainage catheter overlies the right upper quadrant of the abdomen. No dilated small bowel loops. Gaseous distension of the colon is noted which appears improved compared with 09/16/2021.   IMPRESSION: 1. Feeding tube tip within the distal stomach. 2. Improved gaseous distension of the colon.    Electronically Signed   By: Signa Kell M.D.   On: 10/03/2021 06:32  ====================================  CLINICAL DATA:  Aspiration.   EXAM: PORTABLE CHEST 1 VIEW   COMPARISON:  09/15/2021   FINDINGS: There is a right arm PICC line with tip in the cavoatrial junction. Tracheostomy tube tip is above the carina. There is a feeding tube with tip below the GE junction. Stable cardiomediastinal contours. The lungs are suboptimally inflated. Asymmetric elevation of the right hemidiaphragm noted. Asymmetric airspace opacities identified within the left lower lobe concerning for pneumonia or aspiration.   IMPRESSION: 1. Persistent asymmetric airspace opacities within the left lower lobe concerning for pneumonia and or aspiration. 2. Stable support apparatus.     Electronically Signed   By: Signa Kell M.D.   On: 10/03/2021 06:31   Chinita Greenland MSNA MSN ACNPC-AG Acute Care Nurse Practitioner [facility] [place]

## 2021-10-03 NOTE — Progress Notes (Signed)
NAME:  Troy Randall, MRN:  [id number], DOB:  11-11-1960, LOS: 29 ADMISSION DATE:  09/04/2021, CONSULTATION DATE:  10/03/2021 REFERRING MD: Julian Reil - [name], CHIEF COMPLAINT:  Aspiration PNA  History of Present Illness:   61 year old man who presented to [facility] 8/31 with melena. On admission, patient reported development of frequent (6-7) dark stools with associated dizziness/lightheadedness.  FOBT positive and initial Hgb 12.2. PMHx significant for HTN, HLD, DM, and CVA (on ASA/Plavix).  Patient was initially admitted to [facility] 8/31 with GI consulting. Remained orthostatic with progressive decline in Hgb.  CTA Abdomen 9/1 did not show any active bleed, therefore underwent colonoscopy 9/2 without active bleed found, but large clots noted throughout the entire colon.  Subsequently underwent EGD 9/2 which showed a small hiatal hernia, erosive gastropathy with no stigmata of recent bleeding and non-bleeding duodenal diverticulum.   Patient then underwent small bowel enteroscopy 9/2 which noted intermittent blood pumping in the proximal jejunum; unfortunately this was unable to be reached despite multiple attempts, it was injected and clip placed.  Repeat CTA GIB demonstrated positive jejunal diverticular bleeding. Patient was transferred to [facility] for IR involvement. In IR 9/2, active extravasation was noted in the territory of the SMA, jejunal arcade branch, successful coil embolization.  PCCM was consulted 9/2 for ICU transfer/admission. Patient was intubated 9/3 in the setting of worsening clinical status and repeat CTA was completed and negative for bleed, but noting inflamed, air-filled gallbladder for which IR placed percutaneous cholecystostomy tube 9/3. Patient continued to worsen clinically and CRRT was initiated via RIJ Trialysis catheter. Repeat EGD was completed 9/6 and negative for further bleeding. Patient eventually required percutaneous tracheostomy for prolonged intubation (completed 9/11). Patient  underwent MRI 9/16 in the setting of AMS/neurologic changes demonstrating rapid development of white matter lesions; this was repeated 9/17 with contrast with lesions c/f active demyelination. LP ws completed 9/18 with low suspicion for infection. Patient was transferred out of ICU to [name]/s service 9/19 with PCCM following for tracheostomy.  On 9/29, Rapid Response called for tachypnea, respiratory distress. Reconsulted for ICU transfer after severe aspiration event on the floor.  Pertinent Medical History:  HTN, HLD, DM, CVA on ASA/Plavix  Significant Hospital Events: Including procedures, antibiotic start and stop dates in addition to other pertinent events   8/31 presented to [facility], [name] Admit, Gi Consult 9/1 CTA> no evidence of active GI bleed, 2 U PRBC 9/2 2 U PRBC, colonoscopy > no active bleed found but large clots throughout entire colon. EGD> small hiatal hernia, erosive gastropathy with no stigmata of recent bleeding, and non-bleeding duodenal diverticulum.   Underwent then small bowel enteroscopy which noted intermittent blood pumping in the proximal jejunum but was unable to be reached despite multiple attempts, was injected and clip placed. Repeat CTA repeated, which showed positive jejunal diverticular bleeding. IR> active extrav at [facility] territory, jejunal arcade branch, successful coil embo 9/3 Intubated. CTA> no evidence of active GI bleeding.  New finding of abnormal gallbladder with nondependent air, gallbladder wall air, pericholecystic and right upper quadrant inflammation.  IR placed percutaneous cholecystotomy tube.  9/4 R. IJ central line placed with dialysis triple lumen catheter 9/5 CRRT initiated 9/6 Bedside EGD without any evidence of bleeding. CTA a/p without etiology for bleed 9/8 CRRT discontinued. Required ETT exchange yesterday due to blown cuff. 9/11 Bronchoscopic guided percutaneous tracheostomy placement.  Right IJ central line removed. 9/15 flexible sigmoidoscopy  with removal of endoscopy 9/16 MRI brain w/o contrast amended study secondary to range of  motion.  Rapid development numerous round white matter lesions in both cerebral hemispheres.  Prior MRIs. 9/17 MRI brain with contrast numerous contrast-enhancing bilateral white matter lesions consistent with active demyelination; steroid course completed. 9/18 Lumbar puncture  9/19 Transfer out of ICU 9/29 Rapid Response called for tachypnea, respiratory distress. Reconsult for ICU transfer after severe aspiration event on the floor.  Interim History / Subjective:  Rapid response for increased WOB, witnessed aspiration event. Pt noted to be febrile and blood cx ordered to be drawn prior to abx. PCCM called to evaluate for ICU transfer. See eLink notes and earlier critical care consult note for more details.  This morning, pt is awake in bed but not interactive. Calm. On ATC, not ventilated. Wife at bedside.  Will attempt repeat MRI today, per neuro. Unable to go into scanner yesterday due to agitation.  Objective:  Blood pressure 115/79, pulse (!) 132, temperature (!) 102.2 F (39 C), temperature source Axillary, resp. rate (!) 29, height 6' (1.829 m), weight 124.1 kg, SpO2 99 %. FiO2 (%):  [28 %-100 %] 35 %  No intake or output data in the 24 hours ending 10/03/21 1050 Filed Weights   09/30/21 0500 10/01/21 0500 10/03/21 0643  Weight: 126.1 kg 125.4 kg 124.1 kg   Physical Examination: General: Acute-on-chronically ill-appearing middle-aged man in NAD. Appears uncomfortable. HEENT: [place]/AT, anicteric sclera, pupils equal/round 2.58mm, sluggishly reactive to light, moist mucous membranes. Tracheostomy in place on trach collar with thick, tan secretions. Neuro:  Awake, eyes open, somewhat responsive to voice, does not track examiner.  Responds to noxious stimuli. Withdraws to pain in all 4 extremities. Not following commands. Moves all 4 extremities spontaneously.  CV: Tachycardic, no m/g/r. PULM:  Tachypneic with mildly labored breathing on ATC. Lung fields coarse throughout, diminished bilaterally L > R. GI: Soft, nontender, nondistended. Hypoactive bowel sounds. Percutaneous cholecystostomy in place. Extremities: Trace BLE edema noted. Skin: Warm/dry, no rashes.  Resolved Hospital Problem List:   AKI  - off CRRT 9/8 Bilateral nonobstructing nephrolithiasis - suspect chronic BPH Hypernatremia Septic shock Hemorrhagic shock - resolved ABLA secondary to lower GI bleed, jejunal diverticular and ulcers secondary to ischemia Acute blood loss and iron deficiency anemia, secondary to above - 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 and bedside EGD 9/6. Endoscopy capsule removal 9/15  Assessment & Plan:   Acute aspiration event 9/29 with c/f aspiration PNA Acute hypoxemic respiratory failure s/p percutaneous tracheostomy 9/11 S/p tracheostomy 9/11. PCCM had been following weekly for trach, called back 9/29AM for respiratory distress/increased WOB in the setting of almost certain aspiration event. - Continue supplemental O2 support/ATC as tolerated - May require return to full vent support for airway protection if ongoing concern for aspiration - Wean FiO2 for O2 sat > 90% - If returning to vent, daily WUA/SBT once clinically appropriate - Trach care per protocol - VAP bundle - Pulmonary hygiene - PAD protocol if returning to vent, anxiolytics PRN - Trend WBC, LA - F/u Resp Cx/TA, Bcx, legionella/urine strep - Broad-spectrum antibiotic coverage with cefepime, vanc; narrow as able - 500 mL LR bolus  Acute encephalopathy, possibly secondary to hypoxic-ischemic encephalopathy History of CVA 04/2021  without residual deficits, concern for recurrence - Neurology following - S/p Solumedrol x 5-day course - PLEX per Neuro, access for this may need to be delayed until more stable  E. coli Cholecystitis s/p percutaneous cholecystostomy drain placement 9/3 Percutaneous  cholecystostomy drain placed and continues to have adequate drainage. - S/p ceftriaxone  x 7-day course - Antibiotics (broad-spectrum) resumed as above - Will eventually need outpatient surgery/IR follow up  T2DM - Reduce basal Semglee to 25U BID - SSI - CBGs Q4H - Goal CBG 140-180  Pulmonary nodules in bilateral bases, L 3mm Family reports patient is a never smoker. Family does endorse dipping. - No further f/u needed; patient is low risk per Fleischner criteria  Best Practice: (right click and "Reselect all SmartList Selections" daily)   Diet/type: NPO DVT prophylaxis: SCDs (no AC in the setting of recent GIB) GI prophylaxis: PPI Central venous access:  Yes, and it is still needed Foley:  N/A Code Status:  full code Last date of multidisciplinary goals of care discussion [Pending]  Critical care time:   The patient is critically ill with multiple organ system failure and requires high complexity decision making for assessment and support, frequent evaluation and titration of therapies, advanced monitoring, review of radiographic studies and interpretation of complex data.   Sylvan Cheese, [place]

## 2021-10-03 NOTE — Sepsis Progress Note (Signed)
Confirmed with bedside RN that blood cultures were drawn first, and the antibiotic is being given now.

## 2021-10-03 NOTE — Progress Notes (Signed)
PT Cancellation Note  Patient Details Name: ZAIAH ECKERSON MRN: [id number] DOB: 1960-02-04   Cancelled Treatment:    Reason Eval/Treat Not Completed: Patient at procedure or test/unavailable.  Will try back as able and appropriate. 10/03/2021  Ginger Carne., PT Acute Rehabilitation Services [phone]  (office)   Tessie Fass Kerilyn Cortner 10/03/2021, 1:26 PM

## 2021-10-03 NOTE — Progress Notes (Signed)
Notably, patient was tachycardic initially.  Normal sputum suctioned from trach.   During work up for tachycardia, and evaluation by Rapid response nurse, He developed coughing and large volume of apparent gastric fluid/bile was suctioned from trach.   Patient then noted to be febrile.

## 2021-10-03 NOTE — Progress Notes (Signed)
IR was requested for image guided catheter placement for PLEX treatment yesterday, and the procedure was tentatively scheduled for today.   Overnight, rapid response was called due to tachycardia, respiratory distress, and emesis, patient was transferred back to ICU.  He also developed fever today, he is currently being work up for sepsis.  Spoke with attending Dr. Erin Fulling, no line placement until sepsis is ruled out.     Will delete the catheter placement order, please re-order when patient is more stable.  Please call IR for questions and concerns.   Armando Gang Kasidy Gianino PA-C 10/03/2021 9:32 AM

## 2021-10-03 NOTE — Progress Notes (Addendum)
[name] night coverage note:  Pt seen at bedside due to rapid response, respiratory distress.  Pt with respiratory distress, tachycardic to 140s (s.tach), temp of 103.2.  BP 878 systolic.  RR 30-40.  Pt with essentially witnessed aspiration event(s).  Resp therapy is suctioning bilious / purulent secretions from trach that looks very suspicious for gastric contents.  Assessment: Most likely respiratory distress secondary to Aspiration pneumonia / pneumonitis with sepsis (fever 103.2, Tachy to 140, and WBC 15.9k this AM is up from 11.2k yesterday).  ABG doesn't look terrible but pt with severe increased WOB.  CXR still pending.  PE also possible but significantly less likely given the essentially witnessed aspiration / suctioning findings.  Plan: 1) Sepsis pathway 2) empiric cefepime + Vanc for presumed pulm source 3) putting on IVF at rate of 125 cc/hr, holding off on bolus for the moment pending lactic acid 4) check lactate 5) transferring pt to ICU for PCCM consultation, I suspect he may need to be put on vent for airway protection (thankfully his trach is already cuffed) 6) BCx sent 7) CXR still pending  CRITICAL CARE Performed by: Etta Quill.   Total critical care time: 60 minutes  Critical care time was exclusive of separately billable procedures and treating other patients.  Critical care was necessary to treat or prevent imminent or life-threatening deterioration.  Critical care was time spent personally by me on the following activities: development of treatment plan with patient and/or surrogate as well as nursing, discussions with consultants, evaluation of patient's response to treatment, examination of patient, obtaining history from patient or surrogate, ordering and performing treatments and interventions, ordering and review of laboratory studies, ordering and review of radiographic studies, pulse oximetry and re-evaluation of patient's condition.

## 2021-10-04 DIAGNOSIS — G9341 Metabolic encephalopathy: Secondary | ICD-10-CM | POA: Diagnosis not present

## 2021-10-04 LAB — GLUCOSE, CAPILLARY
Glucose-Capillary: 137 mg/dL — ABNORMAL HIGH (ref 70–99)
Glucose-Capillary: 138 mg/dL — ABNORMAL HIGH (ref 70–99)
Glucose-Capillary: 170 mg/dL — ABNORMAL HIGH (ref 70–99)
Glucose-Capillary: 159 mg/dL — ABNORMAL HIGH (ref 70–99)
Glucose-Capillary: 185 mg/dL — ABNORMAL HIGH (ref 70–99)
Glucose-Capillary: 232 mg/dL — ABNORMAL HIGH (ref 70–99)

## 2021-10-04 LAB — CBC WITH DIFFERENTIAL/PLATELET
Abs Immature Granulocytes: 0.18 10*3/uL — ABNORMAL HIGH (ref 0.00–0.07)
Basophils Absolute: 0 10*3/uL (ref 0.0–0.1)
Basophils Relative: 0 %
Eosinophils Absolute: 0.1 10*3/uL (ref 0.0–0.5)
Eosinophils Relative: 0 %
HCT: 29.5 % — ABNORMAL LOW (ref 39.0–52.0)
Hemoglobin: 9.2 g/dL — ABNORMAL LOW (ref 13.0–17.0)
Immature Granulocytes: 1 %
Lymphocytes Relative: 5 %
Lymphs Abs: 0.6 10*3/uL — ABNORMAL LOW (ref 0.7–4.0)
MCH: 28.4 pg (ref 26.0–34.0)
MCHC: 31.2 g/dL (ref 30.0–36.0)
MCV: 91 fL (ref 80.0–100.0)
Monocytes Absolute: 0.5 10*3/uL (ref 0.1–1.0)
Monocytes Relative: 4 %
Neutro Abs: 11.5 10*3/uL — ABNORMAL HIGH (ref 1.7–7.7)
Neutrophils Relative %: 90 %
Platelets: 148 10*3/uL — ABNORMAL LOW (ref 150–400)
RBC: 3.24 MIL/uL — ABNORMAL LOW (ref 4.22–5.81)
RDW: 15.7 % — ABNORMAL HIGH (ref 11.5–15.5)
WBC: 12.8 10*3/uL — ABNORMAL HIGH (ref 4.0–10.5)
nRBC: 0 % (ref 0.0–0.2)

## 2021-10-04 LAB — BASIC METABOLIC PANEL
Anion gap: 6 (ref 5–15)
BUN: 18 mg/dL (ref 6–20)
CO2: 22 mmol/L (ref 22–32)
Calcium: 7.4 mg/dL — ABNORMAL LOW (ref 8.9–10.3)
Chloride: 108 mmol/L (ref 98–111)
Creatinine, Ser: 0.81 mg/dL (ref 0.61–1.24)
GFR, Estimated: >60 mL/min (ref >60)
Glucose, Bld: 342 mg/dL — ABNORMAL HIGH (ref 70–99)
Potassium: 3.4 mmol/L — ABNORMAL LOW (ref 3.5–5.1)
Sodium: 136 mmol/L (ref 135–145)

## 2021-10-04 LAB — PHOSPHORUS: Phosphorus: 2.7 mg/dL (ref 2.5–4.6)

## 2021-10-04 LAB — MAGNESIUM: Magnesium: 2.1 mg/dL (ref 1.7–2.4)

## 2021-10-04 MED ORDER — HEPARIN SODIUM (PORCINE) 5000 UNIT/ML IJ SOLN
5000.0000 [IU] | Freq: Three times a day (TID) | INTRAMUSCULAR | Status: DC
  Administered 2021-10-04 – 2021-10-05 (×5): 5000 [IU] via SUBCUTANEOUS
  Filled 2021-10-04 (×5): qty 1

## 2021-10-04 MED ORDER — METRONIDAZOLE 500 MG/100ML IV SOLN
500.0000 mg | Freq: Two times a day (BID) | INTRAVENOUS | Status: DC
  Administered 2021-10-04 – 2021-10-06 (×5): 500 mg via INTRAVENOUS
  Filled 2021-10-04 (×5): qty 100

## 2021-10-04 MED ORDER — POTASSIUM CHLORIDE 20 MEQ PO PACK
40.0000 meq | PACK | Freq: Once | ORAL | Status: AC
  Administered 2021-10-04: 40 meq
  Filled 2021-10-04: qty 2

## 2021-10-04 NOTE — Progress Notes (Addendum)
NAME:  Troy Randall, MRN:  [id number], DOB:  07/26/1960, LOS: 36 ADMISSION DATE:  09/04/2021, CONSULTATION DATE:  10/03/21 REFERRING MD:  Dr. Fabio Neighbors- [name], CHIEF COMPLAINT:  Aspiration event    History of Present Illness:  This is a 61 year old male initially admitted for GI bleeding status post embolization by IR complicated by acute cholecystitis status post percutaneous Chole drain.  Patient was found to have altered mental status, and MRI showed acute demyelinating disease.  Patient is now status post 5 doses of steroids, with no improvement.  Plan is to start Plex.  Prior to starting Plex, patient had a aspiration event, requiring ICU transfer for more respiratory status monitoring.  Pertinent  Medical History  HTN, HLD, DM, CVA on ASA/Plavix  Significant Hospital Events: Including procedures, antibiotic start and stop dates in addition to other pertinent events   8/31 presented to [facility] ED, [name] Admit, Gi Consult 9/1 CTA> no evidence of active GI bleed, 2 U PRBC 9/2 2 U PRBC, colonoscopy > no active bleed found but large clots throughout entire colon. EGD> small hiatal hernia, erosive gastropathy with no stigmata of recent bleeding, and non-bleeding duodenal diverticulum.   Underwent then small bowel enteroscopy which noted intermittent blood pumping in the proximal jejunum but was unable to be reached despite multiple attempts, was injected and clip placed. Repeat CTA repeated, which showed positive jejunal diverticular bleeding. IR> active extrav at [facility], jejunal arcade branch, successful coil embo 9/3 Intubated. CTA> no evidence of active GI bleeding.  New finding of abnormal gallbladder with nondependent air, gallbladder wall air, pericholecystic and right upper quadrant inflammation.  IR placed percutaneous cholecystotomy tube.  9/4 R. IJ central line placed with dialysis triple lumen catheter 9/5 CRRT initiated 9/6 Bedside EGD without any evidence of bleeding. CTA a/p without  etiology for bleed 9/8 CRRT discontinued. Required ETT exchange yesterday due to blown cuff. 9/11 Bronchoscopic guided percutaneous tracheostomy placement.  Right IJ central line removed. 9/15 flexible sigmoidoscopy with removal of endoscopy 9/16 MRI brain w/o contrast amended study secondary to range of motion.  Rapid development numerous round white matter lesions in both cerebral hemispheres.  Prior MRIs. 9/17 MRI brain with contrast numerous contrast-enhancing bilateral white matter lesions consistent with active demyelination; steroid course completed. 9/18 Lumbar puncture  9/19 Transfer out of ICU 9/29 Rapid Response called for tachypnea, respiratory distress. Reconsult for ICU transfer after severe aspiration event on the floor.  Interim History / Subjective:  Patient is in bed upon my exam.  Patient is unable to verbally communicate with me.  Patient does waved to me on my exam.  He is unable to track me across the room.  Patient's family member is at bedside who states that he is doing okay.  She has been working with him to have more upper extremity movement and she reports he has been moving his upper extremities well.  Objective   Blood pressure 119/78, pulse (!) 103, temperature 97.6 F (36.4 C), temperature source Oral, resp. rate 19, height 6' (1.829 m), weight 125.8 kg, SpO2 100 %.    FiO2 (%):  [28 %-35 %] 28 %   Intake/Output Summary (Last 24 hours) at 10/04/2021 0659 Last data filed at 10/04/2021 0600 Gross per 24 hour  Intake 4486.19 ml  Output 470 ml  Net 4016.19 ml   Filed Weights   10/01/21 0500 10/03/21 0643 10/04/21 0403  Weight: 125.4 kg 124.1 kg 125.8 kg    Examination:  General: Patient is resting in  bed with trach collar placed Eyes: Unable to track Neck: Trach collar in place Cardio: Tachycardic rate regular rhythm, no murmurs, rubs or gallops. 2+ pulses to bilateral upper and lower extremities  Pulmonary: Wheezing noted to bilateral lungs Abdomen:  Soft, nontender with normoactive bowel sounds with no rebound or guarding  Neuro: not alert or oriented.  Able to move toes.  Unable to lift lower extremities off bed.  Unable to coordinate movements. Skin: No rashes noted  MSK: 1/5 strength to bilateral lower extremities on hip flexion.   Resolved Hospital Problem list   AKI  - off CRRT 9/8 Bilateral nonobstructing nephrolithiasis - suspect chronic BPH Hypernatremia Septic shock Hemorrhagic shock - resolved ABLA secondary to lower GI bleed, jejunal diverticular and ulcers secondary to ischemia Acute blood loss and iron deficiency anemia, secondary to above - 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 and bedside EGD 9/6. Endoscopy capsule removal 9/15  Assessment & Plan:   #Acute aspiration event 9/29 with c/f aspiration PNA #Acute hypoxemic respiratory failure s/p percutaneous tracheostomy 9/11 S/p tracheostomy 9/11. PCCM had been following weekly for trach, called back 9/29AM for respiratory distress/increased WOB in the setting of almost certain aspiration event. Patient is doing well from a respiratory standpoint. Patent is no requiring more oxygen than what he is at. Saturations are great. There is no evidence of respiratory distress. Tachycardia is resolving slowly. Respiratory panel goring gram negative and positive organisms at this point. Will add flagyl for anaerobic coverage. Lactic acid down to 1.9.  - Continue supplemental O2 support/ATC as tolerated - May require return to full vent support for airway protection if ongoing concern for aspiration - Wean FiO2 for O2 sat > 90% - If returning to vent, daily WUA/SBT once clinically appropriate - Trach care per protocol - VAP bundle - Pulmonary hygiene - PAD protocol if returning to vent, anxiolytics PRN - Trend WBC, LA - F/u Resp Cx/TA, Bcx, legionella/urine strep - Broad-spectrum antibiotic coverage with cefepime, vanc and will add flagyl  -will slowly resume  tube feeds, monitor for aspiration  #Acute encephalopathy, possibly secondary to hypoxic-ischemic encephalopathy History of CVA 04/2021  without residual deficits, concern for recurrence -MRI showing stable contrast enhancing lesions consistent with active demyelinating disease with now lesion on posterior left front lobe - Neurology following - S/p Solumedrol x 5-day course, with minimal improvement of the neurological status  - PLEX per Neuro, access for this may need to be delayed until more stable -Plan for central line access tomorrow  -Per neuro, no one is on call for PLEX in dialysis on 10/05/21, and so first anticipated PLEX therapy will be on 10/06/21 -Continue home aspirin 81 mg daily -Continue home Crestor 40 mg daily     #E. coli Cholecystitis s/p percutaneous cholecystostomy drain placement 9/3 Percutaneous cholecystostomy drain placed and continues to have adequate drainage. - S/p ceftriaxone x 7-day course - Antibiotics (broad-spectrum) resumed as above - Will eventually need outpatient surgery/IR follow up   #T2DM, requiring insulin  -As tube feeds were held yesterday, basal insulin was decreased to 25 units twice daily -Tube feeds will be resumed today, continue to monitor blood sugars -Consider increasing Semglee if blood sugars increase above goal of 140-180 -Continue Semglee 25 units twice daily -Continue sliding scale insulin -CBGs every 4 hours  #Hypokalemia Potassium 3.4 this AM -will replete and recheck in AM    #Pulmonary nodules in bilateral bases, L 49mm Family reports patient is a never smoker. Family does  endorse dipping. - No further f/u needed; patient is low risk per Fleischner criteria  Best Practice (right click and "Reselect all SmartList Selections" daily)   Diet/type: tubefeeds DVT prophylaxis: prophylactic heparin  GI prophylaxis: PPI Lines: N/A Foley:  N/A Code Status:  full code  Labs   CBC: Recent Labs  Lab 09/30/21 0330  10/01/21 0359 10/02/21 0355 10/03/21 0429 10/03/21 0546 10/04/21 0410  WBC 11.8* 12.5* 11.2* 15.9*  --  12.8*  NEUTROABS 9.4* 9.4* 8.7* 14.5*  --  11.5*  HGB 10.9* 10.7* 10.2* 11.3* 11.2* 9.2*  HCT 34.4* 34.0* 31.2* 34.5* 33.0* 29.5*  MCV 89.4 89.9 87.2 88.9  --  91.0  PLT 155 149* 156 183  --  148*    Basic Metabolic Panel: Recent Labs  Lab 09/28/21 0440 09/29/21 0426 09/30/21 0330 10/01/21 0359 10/02/21 0355 10/03/21 0429 10/03/21 0546 10/04/21 0410  NA 151* 144 141 142  --  140 139  --   K 4.1 3.8 3.7 3.7  --  3.9 3.9  --   CL 114* 107 106 107  --  106  --   --   CO2 29 29 29 28   --  27  --   --   GLUCOSE 181* 192* 161* 139*  --  147*  --   --   BUN 35* 30* 21* 21*  --  17  --   --   CREATININE 0.78 0.72 0.63 0.65  --  0.77  --   --   CALCIUM 8.6* 8.5* 8.0* 8.2*  --  8.2*  --   --   MG 2.8* 2.6* 2.3 2.2 2.2 1.8  --  2.1  PHOS 4.3 3.6 3.6 3.2 2.7 3.0  --  2.7   GFR: Estimated Creatinine Clearance: 136.3 mL/min (by C-G formula based on SCr of 0.77 mg/dL). Recent Labs  Lab 10/01/21 0359 10/02/21 0355 10/03/21 0429 10/03/21 0816 10/03/21 1511 10/04/21 0410  WBC 12.5* 11.2* 15.9*  --   --  12.8*  LATICACIDVEN  --   --   --  2.1* 1.9  --     Liver Function Tests: Recent Labs  Lab 09/28/21 0440 09/29/21 0426 09/30/21 0330 10/01/21 0359  AST 25 27 23 24   ALT 37 39 34 31  ALKPHOS 89 87 86 85  BILITOT 0.5 0.5 0.7 0.5  PROT 5.8* 5.4* 5.1* 5.2*  ALBUMIN 2.4* 2.2* 1.9* 1.9*   Recent Labs  Lab 10/03/21 0816  LIPASE 42   No results for input(s): "AMMONIA" in the last 168 hours.  ABG    Component Value Date/Time   PHART 7.476 (H) 10/03/2021 0546   PCO2ART 36.1 10/03/2021 0546   PO2ART 171 (H) 10/03/2021 0546   HCO3 26.1 10/03/2021 0546   TCO2 27 10/03/2021 0546   ACIDBASEDEF 5.0 (H) 09/08/2021 1907   O2SAT 100 10/03/2021 0546     Coagulation Profile: Recent Labs  Lab 10/03/21 0816  INR 1.0    Cardiac Enzymes: No results for input(s):  "CKTOTAL", "CKMB", "CKMBINDEX", "TROPONINI" in the last 168 hours.  HbA1C: Hgb A1c MFr Bld  Date/Time Value Ref Range Status  04/29/2021 05:02 AM 6.2 (H) 4.8 - 5.6 % Final    Comment:    (NOTE) Pre diabetes:          5.7%-6.4%  Diabetes:              >6.4%  Glycemic control for   <7.0% adults with diabetes   03/31/2017 04:25 PM 9.5 (H) <  5.7 % of total Hgb Final    Comment:    For someone without known diabetes, a hemoglobin A1c value of 6.5% or greater indicates that they may have  diabetes and this should be confirmed with a follow-up  test. . For someone with known diabetes, a value <7% indicates  that their diabetes is well controlled and a value  greater than or equal to 7% indicates suboptimal  control. A1c targets should be individualized based on  duration of diabetes, age, comorbid conditions, and  other considerations. . Currently, no consensus exists regarding use of hemoglobin A1c for diagnosis of diabetes for children. .     CBG: Recent Labs  Lab 10/03/21 1123 10/03/21 1516 10/03/21 1918 10/03/21 2316 10/04/21 0325  GLUCAP 82 94 115* 157* 137*    Review of Systems:   Negative except stated in HPI  Past Medical History:  He,  has a past medical history of Anemia, Arthritis, CVA (cerebral vascular accident) (HCC), Diabetes mellitus without complication (HCC), Hyperlipidemia, Hypertension, and Kidney stones.   Surgical History:   Past Surgical History:  Procedure Laterality Date   ANKLE SURGERY     BIOPSY  09/10/2021   Procedure: BIOPSY;  Surgeon: Jeani Hawking, MD;  Location: [facility] ENDOSCOPY;  Service: Gastroenterology;;   COLONOSCOPY WITH PROPOFOL N/A 09/06/2021   Procedure: COLONOSCOPY WITH PROPOFOL;  Surgeon: Dolores Frame, MD;  Location: [facility] ENDO SUITE;  Service: Gastroenterology;  Laterality: N/A;   ENTEROSCOPY N/A 09/10/2021   Procedure: ENTEROSCOPY;  Surgeon: Jeani Hawking, MD;  Location: [facility] ENDOSCOPY;  Service: Gastroenterology;   Laterality: N/A;   ENTEROSCOPY  09/06/2021   Procedure: ENTEROSCOPY;  Surgeon: Dolores Frame, MD;  Location: [facility] ENDO SUITE;  Service: Gastroenterology;;   ESOPHAGOGASTRODUODENOSCOPY (EGD) WITH PROPOFOL  09/06/2021   Procedure: ESOPHAGOGASTRODUODENOSCOPY (EGD) WITH PROPOFOL;  Surgeon: Dolores Frame, MD;  Location: [facility] ENDO SUITE;  Service: Gastroenterology;;   Wenda Low SIGMOIDOSCOPY N/A 09/19/2021   Procedure: Arnell Sieving;  Surgeon: Jeani Hawking, MD;  Location: [facility] ENDOSCOPY;  Service: Gastroenterology;  Laterality: N/A;   FOREIGN BODY REMOVAL  09/19/2021   Procedure: FOREIGN BODY REMOVAL;  Surgeon: Jeani Hawking, MD;  Location: [facility] ENDOSCOPY;  Service: Gastroenterology;;   Emelda Brothers CAPSULE STUDY  09/06/2021   Procedure: GIVENS CAPSULE STUDY;  Surgeon: Dolores Frame, MD;  Location: [facility] ENDO SUITE;  Service: Gastroenterology;;   HERNIA REPAIR     umbilical x1 Incisional x1   INCISIONAL HERNIA REPAIR  04/13/2011   Procedure: LAPAROSCOPIC INCISIONAL HERNIA;  Surgeon: Dalia Heading, MD;  Location: [facility] ORS;  Service: General;  Laterality: N/A;  Recurrent Laparoscopic Incisional Herniorraphy with Mesh   IR ANGIOGRAM SELECTIVE EACH ADDITIONAL VESSEL  09/09/2021   IR ANGIOGRAM VISCERAL SELECTIVE  09/07/2021   IR ANGIOGRAM VISCERAL SELECTIVE  09/06/2021   IR ANGIOGRAM VISCERAL SELECTIVE  09/06/2021   IR EMBO ART  VEN HEMORR LYMPH EXTRAV  INC GUIDE ROADMAPPING  09/06/2021   IR GUIDED DRAIN W CATHETER PLACEMENT  09/07/2021   IR US GUIDE BX ASP/DRAIN  09/07/2021   IR US GUIDE VASC ACCESS RIGHT  09/07/2021   IR US GUIDE VASC ACCESS RIGHT  09/06/2021   KIDNEY STONE SURGERY       Social History:   reports that he has never smoked. His smokeless tobacco use includes snuff. He reports that he does not drink alcohol and does not use drugs.   Family History:  His family history includes Cancer in an other family member; Diabetes in his father; Heart  attack in an other family member; Heart  failure in his mother. There is no history of Anesthesia problems, Hypotension, Malignant hyperthermia, Pseudochol deficiency, Colon cancer, or Inflammatory bowel disease.   Allergies Allergies  Allergen Reactions   Metformin And Related     GI upset   Penicillins Hives     Home Medications  Prior to Admission medications   Medication Sig Start Date End Date Taking? Authorizing Provider  amLODipine (NORVASC) 10 MG tablet Take 1 tablet (10 mg total) by mouth daily. 06/15/17  Yes Aliene Beams, MD  aspirin EC 81 MG tablet Take 81 mg by mouth daily. Swallow whole.   Yes [provider]  clopidogrel (PLAVIX) 75 MG tablet Take 1 tablet (75 mg total) by mouth daily. 04/30/21  Yes Azucena Fallen, MD  glipiZIDE (GLUCOTROL XL) 10 MG 24 hr tablet Take 1 tablet (10 mg total) by mouth daily with breakfast. 04/16/17  Yes Hagler, Fleet Contras, MD  levocetirizine (XYZAL) 5 MG tablet Take 1 tablet by mouth at bedtime.   Yes [provider]  lisinopril (PRINIVIL,ZESTRIL) 40 MG tablet Take 1 tablet (40 mg total) by mouth daily. 05/12/17  Yes Aliene Beams, MD  metFORMIN (GLUCOPHAGE-XR) 500 MG 24 hr tablet Take 500-1,000 mg by mouth in the morning and at bedtime. 02/09/21  Yes [provider]  rosuvastatin (CRESTOR) 40 MG tablet Take 1 tablet (40 mg total) by mouth daily. 04/29/21  Yes Azucena Fallen, MD  tamsulosin (FLOMAX) 0.4 MG CAPS capsule Take 0.4 mg by mouth daily. 02/12/21  Yes [provider]  traZODone (DESYREL) 150 MG tablet Take 150 mg by mouth at bedtime. 02/04/21  Yes [provider]  TRESIBA FLEXTOUCH 200 UNIT/ML FlexTouch Pen Inject 20 Units into the skin in the morning and at bedtime. 03/14/21  Yes [provider]  BD VEO INSULIN SYRINGE U/F 31G X 15/64" 1 ML MISC  USE AS DIRECTED 06/08/17   Aliene Beams, MD  glucose blood (ONETOUCH VERIO) test strip TEST twice a day 05/04/17   Aliene Beams, MD  Insulin Pen Needle (NOVOTWIST) 32G X 5 MM MISC  Use two daily to inject Victoza and Toujeo. 04/25/15   Reather Littler, MD  [facility] DELICA LANCETS 33G MISC Use to check blood sugar once a day dx code E11.65 11/21/14   Reather Littler, MD     Critical care time: 72    Modena Slater, DO Internal Medicine Resident PGY-1 Pager: [phone]

## 2021-10-04 NOTE — Progress Notes (Addendum)
Neurology Progress Note  Subjective: Patient with concern for sepsis overnight- transferred to ICU with a TMAX of 103.2 F and tachycardia with a heart rate into the 140's Wife at bedside with reports of concern for bile orally and bilious discharge from patient's tracheostomy.  MRI attempted yesterday with 2 mg IV lorazepam. Per MRI tech, patient was unable to sit still for MRI imaging. Sedation re-ordered per primary team and will re-attempt imaging today. MRI notified  Infectious work up per primary team pending   Objective: Current vital signs: BP 119/78   Pulse (!) 102   Temp 98.2 F (36.8 C) (Axillary)   Resp (!) 21   Ht 6' (1.829 m)   Wt 125.8 kg   SpO2 100%   BMI 37.61 kg/m  Vital signs in last 24 hours: Temp:  [97.6 F (36.4 C)-102.6 F (39.2 C)] 98.2 F (36.8 C) (09/30 0700) Pulse Rate:  [87-113] 102 (09/30 0810) Resp:  [0-29] 21 (09/30 0810) BP: (74-134)/(55-78) 119/78 (09/30 0630) SpO2:  [99 %-100 %] 100 % (09/30 0810) FiO2 (%):  [28 %] 28 % (09/30 0810) Weight:  [125.8 kg] 125.8 kg (09/30 0403)  Intake/Output from previous day: 09/29 0701 - 09/30 0700 In: 4486.2 [I.V.:1426.4; NG/GT:1020; IV Piggyback:2034.8] Out: 470 [Urine:400; Drains:70] Intake/Output this shift: Total I/O In: -  Out: 650 [Urine:650] Nutritional status:  Diet Order             Diet NPO time specified  Diet effective now                  HEENT: [place]/AT. Trach collar is noted with nonbloody secretions Ext: No edema   Neurologic Exam: Ment: Awake with eyes open, looking upward.  Intermittently tracks examiner, more on the right today.  Still with no attempts to speak. Able to move all extremities without gravity but does not move them to command.  Does not nod to answer questions today.  CN: PERRL. Fixates briefly. Eyes are conjugate. Will intermittently track over a limited range of EOM. Face is symmetric.  Motor/Sensory: Spontaneous movement without gravity throughout. Withdraws  to noxious stimuli briskly throughout. No spontaneous antigravity movement noted today, does not follow commands, does not participate in confrontational strength testing. Cerebellar/Gait: Unable to assess  Lab Results: Results for orders placed or performed during the hospital encounter of 09/04/21 (from the past 48 hour(s))  Glucose, capillary     Status: Abnormal   Collection Time: 10/02/21 11:56 AM  Result Value Ref Range   Glucose-Capillary 267 (H) 70 - 99 mg/dL    Comment: Glucose reference range applies only to samples taken after fasting for at least 8 hours.  Glucose, capillary     Status: Abnormal   Collection Time: 10/02/21  3:53 PM  Result Value Ref Range   Glucose-Capillary 205 (H) 70 - 99 mg/dL    Comment: Glucose reference range applies only to samples taken after fasting for at least 8 hours.  Glucose, capillary     Status: Abnormal   Collection Time: 10/02/21 10:14 PM  Result Value Ref Range   Glucose-Capillary 155 (H) 70 - 99 mg/dL    Comment: Glucose reference range applies only to samples taken after fasting for at least 8 hours.  Glucose, capillary     Status: Abnormal   Collection Time: 10/03/21  3:50 AM  Result Value Ref Range   Glucose-Capillary 145 (H) 70 - 99 mg/dL    Comment: Glucose reference range applies only to samples taken after fasting  for at least 8 hours.  Magnesium     Status: None   Collection Time: 10/03/21  4:29 AM  Result Value Ref Range   Magnesium 1.8 1.7 - 2.4 mg/dL    Comment: Performed at [facility] [address], [facility], [place] [address]  Phosphorus     Status: None   Collection Time: 10/03/21  4:29 AM  Result Value Ref Range   Phosphorus 3.0 2.5 - 4.6 mg/dL    Comment: Performed at [facility] [address], [place], [place] [address]  CBC with Differential/Platelet     Status: Abnormal   Collection Time: 10/03/21  4:29 AM  Result Value Ref Range   WBC 15.9 (H) 4.0 - 10.5 K/uL   RBC 3.88 (L) 4.22 - 5.81 MIL/uL    Hemoglobin 11.3 (L) 13.0 - 17.0 g/dL   HCT 34.5 (L) 39.0 - 52.0 %   MCV 88.9 80.0 - 100.0 fL   MCH 29.1 26.0 - 34.0 pg   MCHC 32.8 30.0 - 36.0 g/dL   RDW 15.0 11.5 - 15.5 %   Platelets 183 150 - 400 K/uL   nRBC 0.0 0.0 - 0.2 %   Neutrophils Relative % 91 %   Neutro Abs 14.5 (H) 1.7 - 7.7 K/uL   Lymphocytes Relative 3 %   Lymphs Abs 0.5 (L) 0.7 - 4.0 K/uL   Monocytes Relative 3 %   Monocytes Absolute 0.4 0.1 - 1.0 K/uL   Eosinophils Relative 1 %   Eosinophils Absolute 0.1 0.0 - 0.5 K/uL   Basophils Relative 0 %   Basophils Absolute 0.0 0.0 - 0.1 K/uL   Immature Granulocytes 2 %   Abs Immature Granulocytes 0.30 (H) 0.00 - 0.07 K/uL    Comment: Performed at [facility] [address] [place], [place] [id number]  Basic metabolic panel     Status: Abnormal   Collection Time: 10/03/21  4:29 AM  Result Value Ref Range   Sodium 140 135 - 145 mmol/L   Potassium 3.9 3.5 - 5.1 mmol/L   Chloride 106 98 - 111 mmol/L   CO2 27 22 - 32 mmol/L   Glucose, Bld 147 (H) 70 - 99 mg/dL    Comment: Glucose reference range applies only to samples taken after fasting for at least 8 hours.   BUN 17 6 - 20 mg/dL   Creatinine, Ser 0.77 0.61 - 1.24 mg/dL   Calcium 8.2 (L) 8.9 - 10.3 mg/dL   GFR, Estimated >60 >60 mL/min    Comment: (NOTE) Calculated using the CKD-EPI Creatinine Equation (2021)    Anion gap 7 5 - 15    Comment: Performed at [facility] [address] [place], [place] [address]  I-STAT 7, (LYTES, BLD GAS, ICA, H+H)     Status: Abnormal   Collection Time: 10/03/21  5:46 AM  Result Value Ref Range   pH, Arterial 7.476 (H) 7.35 - 7.45   pCO2 arterial 36.1 32 - 48 mmHg   pO2, Arterial 171 (H) 83 - 108 mmHg   Bicarbonate 26.1 20.0 - 28.0 mmol/L   TCO2 27 22 - 32 mmol/L   O2 Saturation 100 %   Acid-Base Excess 3.0 (H) 0.0 - 2.0 mmol/L   Sodium 139 135 - 145 mmol/L   Potassium 3.9 3.5 - 5.1 mmol/L   Calcium, Ion 1.14 (L) 1.15 - 1.40 mmol/L   HCT 33.0 (L) 39.0 - 52.0  %   Hemoglobin 11.2 (L)  13.0 - 17.0 g/dL   Patient temperature 103.2 F    Sample type ARTERIAL   Glucose, capillary     Status: None   Collection Time: 10/03/21  6:41 AM  Result Value Ref Range   Glucose-Capillary 90 70 - 99 mg/dL    Comment: Glucose reference range applies only to samples taken after fasting for at least 8 hours.  Glucose, capillary     Status: None   Collection Time: 10/03/21  7:31 AM  Result Value Ref Range   Glucose-Capillary 94 70 - 99 mg/dL    Comment: Glucose reference range applies only to samples taken after fasting for at least 8 hours.  Lactic acid, plasma     Status: Abnormal   Collection Time: 10/03/21  8:16 AM  Result Value Ref Range   Lactic Acid, Venous 2.1 (HH) 0.5 - 1.9 mmol/L    Comment: CRITICAL RESULT CALLED TO, READ BACK BY AND VERIFIED WITH Sophronia Simas, RN [phone] 10/03/21 L. KLAR Performed at [facility], [place] [address] [place], [place] [address]   Protime-INR     Status: None   Collection Time: 10/03/21  8:16 AM  Result Value Ref Range   Prothrombin Time 13.5 11.4 - 15.2 seconds   INR 1.0 0.8 - 1.2    Comment: (NOTE) INR goal varies based on device and disease states. Performed at [facility], [place] [address] [place], [place] [address]   APTT     Status: Abnormal   Collection Time: 10/03/21  8:16 AM  Result Value Ref Range   aPTT <20 (L) 24 - 36 seconds    Comment: REPEATED TO VERIFY SPECIMEN CHECKED FOR CLOTS Performed at [facility], [place] [address] [place], [place] [address]   Lipase, blood     Status: None   Collection Time: 10/03/21  8:16 AM  Result Value Ref Range   Lipase 42 11 - 51 U/L    Comment: Performed at [facility] [address] [place],  [address]  Strep pneumoniae urinary antigen     Status: Abnormal   Collection Time: 10/03/21  9:42 AM  Result Value Ref Range   Strep Pneumo Urinary Antigen NEGATIVE (A) NEGATIVE    Comment: Performed at [facility], [place] [address],  [place],  [address]  Glucose, capillary     Status: None   Collection Time: 10/03/21 11:23 AM  Result Value Ref Range   Glucose-Capillary 82 70 - 99 mg/dL    Comment: Glucose reference range applies only to samples taken after fasting for at least 8 hours.  Lactic acid, plasma     Status: None   Collection Time: 10/03/21  3:11 PM  Result Value Ref Range   Lactic Acid, Venous 1.9 0.5 - 1.9 mmol/L    Comment: Performed at [facility] [address], [place], [place] [address]  Glucose, capillary     Status: None   Collection Time: 10/03/21  3:16 PM  Result Value Ref Range   Glucose-Capillary 94 70 - 99 mg/dL    Comment: Glucose reference range applies only to samples taken after fasting for at least 8 hours.  Culture, Respiratory w Gram Stain     Status: None (Preliminary result)   Collection Time: 10/03/21  6:15 PM   Specimen: Tracheal Aspirate; Respiratory  Result Value Ref Range   Specimen Description TRACHEAL ASPIRATE    Special Requests NONE    Gram Stain  ABUNDANT WBC PRESENT, PREDOMINANTLY PMN FEW GRAM NEGATIVE RODS FEW GRAM POSITIVE COCCI IN PAIRS Performed at [facility], [place] [address] [place], [place] [address]    Culture PENDING    Report Status PENDING   Glucose, capillary     Status: Abnormal   Collection Time: 10/03/21  7:18 PM  Result Value Ref Range   Glucose-Capillary 115 (H) 70 - 99 mg/dL    Comment: Glucose reference range applies only to samples taken after fasting for at least 8 hours.  Glucose, capillary     Status: Abnormal   Collection Time: 10/03/21 11:16 PM  Result Value Ref Range   Glucose-Capillary 157 (H) 70 - 99 mg/dL    Comment: Glucose reference range applies only to samples taken after fasting for at least 8 hours.  Glucose, capillary     Status: Abnormal   Collection Time: 10/04/21  3:25 AM  Result Value Ref Range   Glucose-Capillary 137 (H) 70 - 99 mg/dL    Comment: Glucose reference range applies only to samples taken  after fasting for at least 8 hours.  Magnesium     Status: None   Collection Time: 10/04/21  4:10 AM  Result Value Ref Range   Magnesium 2.1 1.7 - 2.4 mg/dL    Comment: Performed at [facility] [address], [place], [place] [address]  Phosphorus     Status: None   Collection Time: 10/04/21  4:10 AM  Result Value Ref Range   Phosphorus 2.7 2.5 - 4.6 mg/dL    Comment: Performed at [facility] [address] [place], [place] [address]  CBC with Differential/Platelet     Status: Abnormal   Collection Time: 10/04/21  4:10 AM  Result Value Ref Range   WBC 12.8 (H) 4.0 - 10.5 K/uL   RBC 3.24 (L) 4.22 - 5.81 MIL/uL   Hemoglobin 9.2 (L) 13.0 - 17.0 g/dL   HCT 29.5 (L) 39.0 - 52.0 %   MCV 91.0 80.0 - 100.0 fL   MCH 28.4 26.0 - 34.0 pg   MCHC 31.2 30.0 - 36.0 g/dL   RDW 15.7 (H) 11.5 - 15.5 %   Platelets 148 (L) 150 - 400 K/uL   nRBC 0.0 0.0 - 0.2 %   Neutrophils Relative % 90 %   Neutro Abs 11.5 (H) 1.7 - 7.7 K/uL   Lymphocytes Relative 5 %   Lymphs Abs 0.6 (L) 0.7 - 4.0 K/uL   Monocytes Relative 4 %   Monocytes Absolute 0.5 0.1 - 1.0 K/uL   Eosinophils Relative 0 %   Eosinophils Absolute 0.1 0.0 - 0.5 K/uL   Basophils Relative 0 %   Basophils Absolute 0.0 0.0 - 0.1 K/uL   Immature Granulocytes 1 %   Abs Immature Granulocytes 0.18 (H) 0.00 - 0.07 K/uL    Comment: Performed at [facility] [address] [place], [place] [address]  Glucose, capillary     Status: Abnormal   Collection Time: 10/04/21  7:49 AM  Result Value Ref Range   Glucose-Capillary 138 (H) 70 - 99 mg/dL    Comment: Glucose reference range applies only to samples taken after fasting for at least 8 hours.   Recent Results (from the past 240 hour(s))  Culture, Respiratory w Gram Stain     Status: None (Preliminary result)   Collection Time: 10/03/21  6:15 PM   Specimen: Tracheal Aspirate; Respiratory  Result Value Ref Range Status   Specimen Description  TRACHEAL ASPIRATE  Final    Special Requests NONE  Final   Gram Stain   Final    ABUNDANT WBC PRESENT, PREDOMINANTLY PMN FEW GRAM NEGATIVE RODS FEW GRAM POSITIVE COCCI IN PAIRS Performed at [facility], [place] [address], [place], [place] [address]    Culture PENDING  Incomplete   Report Status PENDING  Incomplete   Lipid Panel No results for input(s): "CHOL", "TRIG", "HDL", "CHOLHDL", "VLDL", "LDLCALC" in the last 72 hours.  Studies/Results: MR BRAIN W WO CONTRAST  Result Date: 10/03/2021 CLINICAL DATA:  Myelopathy. Prior MRI concerning for vasculitis or demyelinating disease. EXAM: MRI HEAD WITHOUT AND WITH CONTRAST MRI CERVICAL SPINE WITHOUT AND WITH CONTRAST MRI CERVICAL THORACIC WITHOUT AND CONTRAST CONTRAST:  10 ml Vueway TECHNIQUE: Multiplanar, multiecho pulse sequences of the brain and surrounding structures, and cervical and thoracic spine were obtained without and with intravenous contrast. COMPARISON:  MRI Thoracic and Cervical Spine 09/23/21, MRI Head 09/20/21, MRI head 09/21/21. FINDINGS: MRI HEAD FINDINGS Brain: Redemonstrated are innumerable predominantly supratentorial contrast-enhancing lesions, which grossly look similar to prior exam from September 21, 2021. Many of the lesions are hyperintense on diffusion-weighted imaging, but no longer have a correlate on the corresponding ADC map. There is a single lesion that is new from prior exam with an ADC hypointense correlate in the posterior left frontal lobe (series 3, image 35). Direct comparison for interval development of new contrast-enhancing lesions is limited due to differences in technique, but no definite new lesions are visualized. Comparison for new T2/FLAIR hyperintense lesion is also limited due to the degree of motion artifact on the prior exam. However the extent of T2/FLAIR hyperintense disease is likely unchanged when comparing the T2 signal seen on the diffusion-weighted sequences. Vascular: Normal flow voids. Skull and upper cervical spine:  Normal marrow signal. Sinuses/Orbits: Negative. Other: None. MRI CERVICAL SPINE FINDINGS Limitations: Assessment of the cervical spine is slightly limited due to the degree of motion artifact. Alignment: Physiologic. Vertebrae: No fracture, evidence of discitis, or bone lesion. Cord: Normal signal and morphology. No cord signal abnormality is visualized. Posterior Fossa, vertebral arteries, paraspinal tissues: Partially visualized tracheostomy in place. Disc levels: There is an eccentric left disc bulge at C5-C6 that narrows the left lateral recess. No evidence of high-grade spinal canal stenosis. MRI THORACIC SPINE FINDINGS Alignment:  Physiologic. Vertebrae: No fracture, evidence of discitis, or bone lesion. Cord: Assessment of the cord is limited due to the degree of motion artifact. There are scattered T2 hyperintense lesions seen on the axial T2 weighted sequences, for example at the T4-T5 level (series 24, image 13), the T5-T6 level on the right (series 24, image 18). The lesion at the T5-T6 level may have an enhancing correlate on series 27, image 17. Paraspinal and other soft tissues: Negative. Disc levels: No evidence of high-grade spinal canal or neural foraminal stenosis. IMPRESSION: 1. Redemonstrated are innumerable predominantly supratentorial contrast-enhancing lesions, which are grossly similar to the prior exam from September 21, 2021. There is a single new hyperintense lesion on diffusion weighted imaging in the posterior left frontal lobe with an ADC hypointense correlate, which could represent a new demyelinating lesion. 2. Assessment of the cervical and thoracic spinal cord is limited due to the degree of motion artifact. There may be two T2 hyperintense lesions in the thoracic spinal cord seen on the axial T2 weighted sequences that may have correlates on the sagittal T2/STIR sequences, for example at the T4-T5 level and the T5-T6 level. The lesion at  the T5-T6 level may also have a contrast  enhancing correlate, as described above. Electronically Signed   By: Marin Roberts M.D.   On: 10/03/2021 16:55   MR CERVICAL SPINE W WO CONTRAST  Result Date: 10/03/2021 CLINICAL DATA:  Myelopathy. Prior MRI concerning for vasculitis or demyelinating disease. EXAM: MRI HEAD WITHOUT AND WITH CONTRAST MRI CERVICAL SPINE WITHOUT AND WITH CONTRAST MRI CERVICAL THORACIC WITHOUT AND CONTRAST CONTRAST:  10 ml Vueway TECHNIQUE: Multiplanar, multiecho pulse sequences of the brain and surrounding structures, and cervical and thoracic spine were obtained without and with intravenous contrast. COMPARISON:  MRI Thoracic and Cervical Spine 09/23/21, MRI Head 09/20/21, MRI head 09/21/21. FINDINGS: MRI HEAD FINDINGS Brain: Redemonstrated are innumerable predominantly supratentorial contrast-enhancing lesions, which grossly look similar to prior exam from September 21, 2021. Many of the lesions are hyperintense on diffusion-weighted imaging, but no longer have a correlate on the corresponding ADC map. There is a single lesion that is new from prior exam with an ADC hypointense correlate in the posterior left frontal lobe (series 3, image 35). Direct comparison for interval development of new contrast-enhancing lesions is limited due to differences in technique, but no definite new lesions are visualized. Comparison for new T2/FLAIR hyperintense lesion is also limited due to the degree of motion artifact on the prior exam. However the extent of T2/FLAIR hyperintense disease is likely unchanged when comparing the T2 signal seen on the diffusion-weighted sequences. Vascular: Normal flow voids. Skull and upper cervical spine: Normal marrow signal. Sinuses/Orbits: Negative. Other: None. MRI CERVICAL SPINE FINDINGS Limitations: Assessment of the cervical spine is slightly limited due to the degree of motion artifact. Alignment: Physiologic. Vertebrae: No fracture, evidence of discitis, or bone lesion. Cord: Normal signal and morphology.  No cord signal abnormality is visualized. Posterior Fossa, vertebral arteries, paraspinal tissues: Partially visualized tracheostomy in place. Disc levels: There is an eccentric left disc bulge at C5-C6 that narrows the left lateral recess. No evidence of high-grade spinal canal stenosis. MRI THORACIC SPINE FINDINGS Alignment:  Physiologic. Vertebrae: No fracture, evidence of discitis, or bone lesion. Cord: Assessment of the cord is limited due to the degree of motion artifact. There are scattered T2 hyperintense lesions seen on the axial T2 weighted sequences, for example at the T4-T5 level (series 24, image 13), the T5-T6 level on the right (series 24, image 18). The lesion at the T5-T6 level may have an enhancing correlate on series 27, image 17. Paraspinal and other soft tissues: Negative. Disc levels: No evidence of high-grade spinal canal or neural foraminal stenosis. IMPRESSION: 1. Redemonstrated are innumerable predominantly supratentorial contrast-enhancing lesions, which are grossly similar to the prior exam from September 21, 2021. There is a single new hyperintense lesion on diffusion weighted imaging in the posterior left frontal lobe with an ADC hypointense correlate, which could represent a new demyelinating lesion. 2. Assessment of the cervical and thoracic spinal cord is limited due to the degree of motion artifact. There may be two T2 hyperintense lesions in the thoracic spinal cord seen on the axial T2 weighted sequences that may have correlates on the sagittal T2/STIR sequences, for example at the T4-T5 level and the T5-T6 level. The lesion at the T5-T6 level may also have a contrast enhancing correlate, as described above. Electronically Signed   By: Marin Roberts M.D.   On: 10/03/2021 16:55   MR THORACIC SPINE W WO CONTRAST  Result Date: 10/03/2021 CLINICAL DATA:  Myelopathy. Prior MRI concerning for vasculitis or demyelinating disease. EXAM: MRI HEAD  WITHOUT AND WITH CONTRAST MRI CERVICAL  SPINE WITHOUT AND WITH CONTRAST MRI CERVICAL THORACIC WITHOUT AND CONTRAST CONTRAST:  10 ml Vueway TECHNIQUE: Multiplanar, multiecho pulse sequences of the brain and surrounding structures, and cervical and thoracic spine were obtained without and with intravenous contrast. COMPARISON:  MRI Thoracic and Cervical Spine 09/23/21, MRI Head 09/20/21, MRI head 09/21/21. FINDINGS: MRI HEAD FINDINGS Brain: Redemonstrated are innumerable predominantly supratentorial contrast-enhancing lesions, which grossly look similar to prior exam from September 21, 2021. Many of the lesions are hyperintense on diffusion-weighted imaging, but no longer have a correlate on the corresponding ADC map. There is a single lesion that is new from prior exam with an ADC hypointense correlate in the posterior left frontal lobe (series 3, image 35). Direct comparison for interval development of new contrast-enhancing lesions is limited due to differences in technique, but no definite new lesions are visualized. Comparison for new T2/FLAIR hyperintense lesion is also limited due to the degree of motion artifact on the prior exam. However the extent of T2/FLAIR hyperintense disease is likely unchanged when comparing the T2 signal seen on the diffusion-weighted sequences. Vascular: Normal flow voids. Skull and upper cervical spine: Normal marrow signal. Sinuses/Orbits: Negative. Other: None. MRI CERVICAL SPINE FINDINGS Limitations: Assessment of the cervical spine is slightly limited due to the degree of motion artifact. Alignment: Physiologic. Vertebrae: No fracture, evidence of discitis, or bone lesion. Cord: Normal signal and morphology. No cord signal abnormality is visualized. Posterior Fossa, vertebral arteries, paraspinal tissues: Partially visualized tracheostomy in place. Disc levels: There is an eccentric left disc bulge at C5-C6 that narrows the left lateral recess. No evidence of high-grade spinal canal stenosis. MRI THORACIC SPINE FINDINGS  Alignment:  Physiologic. Vertebrae: No fracture, evidence of discitis, or bone lesion. Cord: Assessment of the cord is limited due to the degree of motion artifact. There are scattered T2 hyperintense lesions seen on the axial T2 weighted sequences, for example at the T4-T5 level (series 24, image 13), the T5-T6 level on the right (series 24, image 18). The lesion at the T5-T6 level may have an enhancing correlate on series 27, image 17. Paraspinal and other soft tissues: Negative. Disc levels: No evidence of high-grade spinal canal or neural foraminal stenosis. IMPRESSION: 1. Redemonstrated are innumerable predominantly supratentorial contrast-enhancing lesions, which are grossly similar to the prior exam from September 21, 2021. There is a single new hyperintense lesion on diffusion weighted imaging in the posterior left frontal lobe with an ADC hypointense correlate, which could represent a new demyelinating lesion. 2. Assessment of the cervical and thoracic spinal cord is limited due to the degree of motion artifact. There may be two T2 hyperintense lesions in the thoracic spinal cord seen on the axial T2 weighted sequences that may have correlates on the sagittal T2/STIR sequences, for example at the T4-T5 level and the T5-T6 level. The lesion at the T5-T6 level may also have a contrast enhancing correlate, as described above. Electronically Signed   By: Lorenza Cambridge M.D.   On: 10/03/2021 16:55   DG Abd 1 View  Result Date: 10/03/2021 CLINICAL DATA:  Aspiration into airway.  Feeding tube. EXAM: ABDOMEN - 1 VIEW COMPARISON:  09/16/2021 FINDINGS: Feeding tube tip is identified within the distal stomach. Percutaneous pigtail drainage catheter overlies the right upper quadrant of the abdomen. No dilated small bowel loops. Gaseous distension of the colon is noted which appears improved compared with 09/16/2021. IMPRESSION: 1. Feeding tube tip within the distal stomach. 2. Improved gaseous distension of  the  colon. Electronically Signed   By: Kerby Moors M.D.   On: 10/03/2021 06:32   DG CHEST PORT 1 VIEW  Result Date: 10/03/2021 CLINICAL DATA:  Aspiration. EXAM: PORTABLE CHEST 1 VIEW COMPARISON:  09/15/2021 FINDINGS: There is a right arm PICC line with tip in the cavoatrial junction. Tracheostomy tube tip is above the carina. There is a feeding tube with tip below the GE junction. Stable cardiomediastinal contours. The lungs are suboptimally inflated. Asymmetric elevation of the right hemidiaphragm noted. Asymmetric airspace opacities identified within the left lower lobe concerning for pneumonia or aspiration. IMPRESSION: 1. Persistent asymmetric airspace opacities within the left lower lobe concerning for pneumonia and or aspiration. 2. Stable support apparatus. Electronically Signed   By: Kerby Moors M.D.   On: 10/03/2021 06:31    Medications: Scheduled:  aspirin  81 mg Per Tube Daily   Chlorhexidine Gluconate Cloth  6 each Topical Daily   docusate  100 mg Per Tube Daily   feeding supplement (PROSource TF20)  60 mL Per Tube BID   free water  400 mL Per Tube Q6H   influenza vac split quadrivalent PF  0.5 mL Intramuscular Tomorrow-1000   insulin aspart  0-15 Units Subcutaneous Q4H   insulin aspart  0-5 Units Subcutaneous QHS   insulin glargine-yfgn  25 Units Subcutaneous BID   mouth rinse  15 mL Mouth Rinse Q2H   pantoprazole  40 mg Per Tube BID   pneumococcal 23 valent vaccine  0.5 mL Intramuscular Tomorrow-1000   polyethylene glycol  17 g Per Tube Daily   rosuvastatin  40 mg Per Tube Daily   sodium chloride flush  10-40 mL Intracatheter Q12H   sodium chloride flush  5 mL Intracatheter Q8H   Zinc Oxide   Topical BID   Continuous:  sodium chloride Stopped (09/15/21 1519)   sodium chloride Stopped (10/03/21 1153)   ceFEPime (MAXIPIME) IV 200 mL/hr at 10/04/21 0600   dextrose 40 mL/hr at 10/04/21 0826   feeding supplement (VITAL 1.5 CAL) 60 mL/hr at 10/04/21 0600   lactated ringers      metronidazole 500 mg (10/04/21 0950)   vancomycin Stopped (10/03/21 2331)   Assessment: 61 year old patient with history of stroke in April of this year with minimal residual deficits, HTN, HLD, DM and recent jejunal bleed, presented with continued AMS and concerning multifocal enhancing white matter lesions on MRI that exhibit morphologies and a distribution suggestive of MS or ADEM, with vasculitis also a consideration, but lower on the differential diagnosis. He has no prior history of demyelinating disease and wife states no prior episodes of focal weakness or vision loss. Also with no family history of MS. Spine imaging negative for cord lesions. Has completed a 5 day course of IV Solumedrol as empiric treatment for the lesions without improvement in presentation. - Serial exam findings: - Exam today: Does not follow commands.  Spontaneous movements without gravity throughout as well as withdraw to noxious stimuli.   Does not attempt to speak.  Per wife at bedside, patient remains significantly altered from baseline. - 9/29 Concern for sepsis overnight with febrile illness TMAX 103.2 F and tachycardia with a heart rate into the 140's. CXR with concern for airspace opacities within the LLL concerning for pneumonia and/or aspiration.  - Imaging: - White matter changes on MRI with multiple foci of bland restricted diffusion may represent strokes, although pattern would be unusual. The locations and morphologies of the lesions could be consistent with demyelinating disease,  but the enhancing pattern would be atypical. Infectious etiology unlikely as there is no perilesional edema or necrotic core associated with the lesions. - MRI brain follow up post-contrast imaging reveals >20 contrast enhancing lesions in the white matter bilaterally. Strongly favor inflammation / active demyelination possibly MS vs ADEM on initial interpretation. Based on personal review of the images by they seem more complatible  with ADEM or a vasculitic process than with MS. Sequela of septic shock is suspected, but I could not find any literature with specific brain imaging to support or refute this component of the DDx.  - MRI of cervical and thoracic spine w/wo contrast: Markedly limited exam for assessment of the spinal cord due to the degree of motion artifact. Within this limitation, there is no definite evidence of active demyelination. Within limitations of motion artifact, there may be demyelinating lesions at the C6 and T1 vertebral body levels. Repeat exam is recommended if more definitive characterization as clinically warranted.  - Repeat MRI Brainafter completion of IV steroids redemonstrated innumerable predominantly supratentorial contrast-enhancing lesions, which are grossly similar to the prior exam from September 21, 2021. There is a single new hyperintense lesion on diffusion weighted imaging in the posterior left frontal lobe with an ADC hypointense correlate, which could represent a new demyelinating lesion. - Repeat MRI C and T spine with assessment of the cervical and thoracic spinal cord is limited due to the degree of motion artifact. There may be two T2 hyperintense lesions in the thoracic spinal cord seen on the axial T2 weighted sequences that may have correlates on the sagittal T2/STIR sequences, for example at the T4-T5 level and the T5-T6 level. The lesion at the T5-T6 level may also have a contrast enhancing correlate, as described above. - Inflammation favored over infection given that he has been afebrile and primarily altered but not ill-appearing therefore no indication for empiric CNS coverage at this time.  - LP was performed by CCM on 9/18. CSF findings: - Glucose 126, RBC count 1-5 (unremarkable), clear, colorless, total protein normal, WBC normal.  - Gram stain: Negative - Fungal and bacterial cultures pending - Infectious disease Biofire panel negative for all pathogens tested,  including cryptococcus, VZV and HSV.  - Cryptococcal Ag negative.  - IgG index is normal at 0.5.  - OCBs testing has resulted: No oligoclonal bands seen but he does have matched bands in both serum and CSF. - Serum labs:  - Vitamin B12 level consistent with supplementation. Mildly elevated Na. BUN 30. ALT 48. TSH normal. ANA negative.  - C3 complement level elevated at 184. Autoimmune panel otherwise negative.  - HIV screen negative.  - DDx for AMS: May be secondary to a CNS inflammatory process as evidenced by the enhancing lesions seen on brain MRI. Inflammation may not be restricted only to the regions of enhancement visible on MRI. Sequelae of end organ damage from his septic shock is also on the DDx. 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  - DDx for white matter lesions on MRI: ADEM, multiple sclerosis and cerebral vasculitis (isolated or as a component of a possible systemic vasculitis) are felt to be highest on the DDx. He did have matched bands on testing for oligoclonal bands. Which suggests peripheral antibody production but could also be related to his recent cholecystitis. CT angio head and neck not concerning for LVO or vasculitis. - Assessment  of response to 5 day course of pulsed-dose steroids (completed on Saturday 9/23): fluctuating exam but at most had a gradual improvement over the 5 day period but he still remains significantly altered from his baseline.   Recommendations: - OOB to chair when possible to reduce the risk of developing an ICU neuropathy or myopathy - Repeat MRI brain, cervical, thoracic spine w/wo contrast. Patient unable to tolerate imaging 9/28, increased sedation ordered today per PCCM, will re-attempt imaging today per MRI.  - Repeat MRI brain with persistent enhancing lesions despite 5 days of IVMP. No clinical change in  exam. Infact, he now has an additional enhancing lesion that was not present on imaging prior to IVMP. - Discussed PLEX therapy with patient's wife at bedside and with ICU Attending Dr. Erin Fulling. IF the blood cultures are negative for over  48 hours, ICU team will put the temp HD cath in and we will do PLEX x 5 sessions. Tentative plan to put HD cath tomorrow AM and then start [place] first thing Monday(discussed with HD RNs).   Donnetta Simpers, MD [facility] [phone]   If 7pm to 7am, please call on call as listed on AMION.

## 2021-10-05 ENCOUNTER — Inpatient Hospital Stay (HOSPITAL_COMMUNITY): Payer: Medicare HMO

## 2021-10-05 DIAGNOSIS — G9340 Encephalopathy, unspecified: Secondary | ICD-10-CM | POA: Diagnosis not present

## 2021-10-05 DIAGNOSIS — E119 Type 2 diabetes mellitus without complications: Secondary | ICD-10-CM

## 2021-10-05 DIAGNOSIS — R911 Solitary pulmonary nodule: Secondary | ICD-10-CM

## 2021-10-05 DIAGNOSIS — G9341 Metabolic encephalopathy: Secondary | ICD-10-CM | POA: Diagnosis not present

## 2021-10-05 DIAGNOSIS — K819 Cholecystitis, unspecified: Secondary | ICD-10-CM | POA: Diagnosis not present

## 2021-10-05 DIAGNOSIS — Z8673 Personal history of transient ischemic attack (TIA), and cerebral infarction without residual deficits: Secondary | ICD-10-CM

## 2021-10-05 DIAGNOSIS — J9601 Acute respiratory failure with hypoxia: Secondary | ICD-10-CM | POA: Diagnosis not present

## 2021-10-05 DIAGNOSIS — B9620 Unspecified Escherichia coli [E. coli] as the cause of diseases classified elsewhere: Secondary | ICD-10-CM

## 2021-10-05 DIAGNOSIS — Z4901 Encounter for fitting and adjustment of extracorporeal dialysis catheter: Secondary | ICD-10-CM

## 2021-10-05 DIAGNOSIS — Z794 Long term (current) use of insulin: Secondary | ICD-10-CM

## 2021-10-05 LAB — GLUCOSE, CAPILLARY
Glucose-Capillary: 199 mg/dL — ABNORMAL HIGH (ref 70–99)
Glucose-Capillary: 194 mg/dL — ABNORMAL HIGH (ref 70–99)
Glucose-Capillary: 173 mg/dL — ABNORMAL HIGH (ref 70–99)
Glucose-Capillary: 167 mg/dL — ABNORMAL HIGH (ref 70–99)
Glucose-Capillary: 253 mg/dL — ABNORMAL HIGH (ref 70–99)
Glucose-Capillary: 245 mg/dL — ABNORMAL HIGH (ref 70–99)
Glucose-Capillary: 264 mg/dL — ABNORMAL HIGH (ref 70–99)

## 2021-10-05 LAB — CBC WITH DIFFERENTIAL/PLATELET
Abs Immature Granulocytes: 0.1 10*3/uL — ABNORMAL HIGH (ref 0.00–0.07)
Basophils Absolute: 0 10*3/uL (ref 0.0–0.1)
Basophils Relative: 0 %
Eosinophils Absolute: 0.1 10*3/uL (ref 0.0–0.5)
Eosinophils Relative: 1 %
HCT: 27.4 % — ABNORMAL LOW (ref 39.0–52.0)
Hemoglobin: 8.9 g/dL — ABNORMAL LOW (ref 13.0–17.0)
Immature Granulocytes: 1 %
Lymphocytes Relative: 6 %
Lymphs Abs: 0.6 10*3/uL — ABNORMAL LOW (ref 0.7–4.0)
MCH: 28.9 pg (ref 26.0–34.0)
MCHC: 32.5 g/dL (ref 30.0–36.0)
MCV: 89 fL (ref 80.0–100.0)
Monocytes Absolute: 0.5 10*3/uL (ref 0.1–1.0)
Monocytes Relative: 5 %
Neutro Abs: 7.9 10*3/uL — ABNORMAL HIGH (ref 1.7–7.7)
Neutrophils Relative %: 87 %
Platelets: 158 10*3/uL (ref 150–400)
RBC: 3.08 MIL/uL — ABNORMAL LOW (ref 4.22–5.81)
RDW: 15.4 % (ref 11.5–15.5)
WBC: 9.1 10*3/uL (ref 4.0–10.5)
nRBC: 0 % (ref 0.0–0.2)

## 2021-10-05 LAB — BASIC METABOLIC PANEL
Anion gap: 9 (ref 5–15)
BUN: 17 mg/dL (ref 6–20)
CO2: 23 mmol/L (ref 22–32)
Calcium: 8.2 mg/dL — ABNORMAL LOW (ref 8.9–10.3)
Chloride: 107 mmol/L (ref 98–111)
Creatinine, Ser: 0.63 mg/dL (ref 0.61–1.24)
GFR, Estimated: >60 mL/min (ref >60)
Glucose, Bld: 225 mg/dL — ABNORMAL HIGH (ref 70–99)
Potassium: 3.8 mmol/L (ref 3.5–5.1)
Sodium: 139 mmol/L (ref 135–145)

## 2021-10-05 LAB — MAGNESIUM: Magnesium: 2 mg/dL (ref 1.7–2.4)

## 2021-10-05 LAB — PHOSPHORUS: Phosphorus: 2.2 mg/dL — ABNORMAL LOW (ref 2.5–4.6)

## 2021-10-05 MED ORDER — FENTANYL CITRATE PF 50 MCG/ML IJ SOSY: 25.0000 ug | PREFILLED_SYRINGE | Freq: Once | INTRAMUSCULAR | Status: AC

## 2021-10-05 MED ORDER — SODIUM CHLORIDE 0.9 % IV BOLUS
500.0000 mL | Freq: Once | INTRAVENOUS | Status: AC
  Administered 2021-10-05: 500 mL via INTRAVENOUS

## 2021-10-05 MED ORDER — HEPARIN SODIUM (PORCINE) 1000 UNIT/ML IJ SOLN
1200.0000 [IU] | Freq: Once | INTRAMUSCULAR | Status: AC | PRN
  Administered 2021-10-05: 1200 [IU] via INTRAVENOUS

## 2021-10-05 MED ORDER — ORAL CARE MOUTH RINSE
15.0000 mL | OROMUCOSAL | Status: DC
  Administered 2021-10-05 – 2021-10-30 (×96): 15 mL via OROMUCOSAL

## 2021-10-05 MED ORDER — INSULIN GLARGINE-YFGN 100 UNIT/ML ~~LOC~~ SOLN
30.0000 [IU] | Freq: Two times a day (BID) | SUBCUTANEOUS | Status: DC
  Administered 2021-10-05 – 2021-10-08 (×6): 30 [IU] via SUBCUTANEOUS
  Filled 2021-10-05 (×10): qty 0.3

## 2021-10-05 MED ORDER — FENTANYL CITRATE PF 50 MCG/ML IJ SOSY
PREFILLED_SYRINGE | INTRAMUSCULAR | Status: AC
  Administered 2021-10-05: 50 ug
  Filled 2021-10-05: qty 1

## 2021-10-05 MED ORDER — ORAL CARE MOUTH RINSE: 15.0000 mL | OROMUCOSAL | Status: DC | PRN

## 2021-10-05 MED ORDER — HEPARIN SOD (PORK) LOCK FLUSH 100 UNIT/ML IV SOLN
500.0000 [IU] | Freq: Once | INTRAVENOUS | Status: DC
  Filled 2021-10-05: qty 5

## 2021-10-05 MED ORDER — K PHOS MONO-SOD PHOS DI & MONO 155-852-130 MG PO TABS
500.0000 mg | ORAL_TABLET | Freq: Once | ORAL | Status: AC
  Administered 2021-10-05: 500 mg
  Filled 2021-10-05: qty 2

## 2021-10-05 MED ORDER — HEPARIN SODIUM (PORCINE) 1000 UNIT/ML IJ SOLN: 1200.0000 [IU] | Freq: Once | INTRAMUSCULAR | Status: AC | PRN

## 2021-10-05 MED ORDER — HEPARIN SODIUM (PORCINE) 1000 UNIT/ML IJ SOLN
INTRAMUSCULAR | Status: AC
  Administered 2021-10-05: 1200 [IU] via INTRAVENOUS
  Filled 2021-10-05: qty 1

## 2021-10-05 NOTE — Progress Notes (Signed)
eLink Physician-Brief Progress Note Patient Name: Troy Randall DOB: 1960-07-25 MRN: [id number]   Date of Service  10/05/2021  HPI/Events of Note  Urinary retention. Bladder scan now shows 667ml. Straight catheterization has been performed earlier today.   eICU Interventions  Placed order to insert foley catheter        Natalyah Cummiskey M DELA CRUZ 10/05/2021, 1:30 AM

## 2021-10-05 NOTE — Progress Notes (Signed)
Shaved pt facial hair per family request.

## 2021-10-05 NOTE — Progress Notes (Signed)
Brief Neuro Update:  I did not evaluate patient but I spoke to his wife at bedside. No new concerns for our team today. PCCM to place HD cath today and transfer him to floor. PLEX orders are in and I had spoken to HD RN yesterday and they plan to start PLEX tomorrow AM.  [place] Pager Number [phone]

## 2021-10-05 NOTE — Procedures (Signed)
Central Venous Catheter Insertion Procedure Note  Troy Randall  [id number]  Oct 04, 1960  Date:10/05/21  Time:12:54 PM   Provider Performing:Terisa Belardo B [place] Spees   Procedure: Insertion of Non-tunneled Central Venous [id number]) with US guidance (76811)   Indication(s) Hemodialysis  Consent Risks of the procedure as well as the alternatives and risks of each were explained to the patient and/or caregiver.  Consent for the procedure was obtained and is signed in the bedside chart  Anesthesia Topical only with 1% lidocaine   Timeout Verified patient identification, verified procedure, site/side was marked, verified correct patient position, special equipment/implants available, medications/allergies/relevant history reviewed, required imaging and test results available.  Sterile Technique Maximal sterile technique including full sterile barrier drape, hand hygiene, sterile gown, sterile gloves, mask, hair covering, sterile ultrasound probe cover (if used).  Procedure Description Area of catheter insertion was cleaned with chlorhexidine and draped in sterile fashion.  With real-time ultrasound guidance a HD catheter was placed into the right internal jugular vein. Nonpulsatile blood flow and easy flushing noted in all ports.  The catheter was sutured in place and sterile dressing applied.  Complications/Tolerance None; patient tolerated the procedure well. Chest X-ray is ordered to verify placement for internal jugular or subclavian cannulation.   Chest x-ray is not ordered for femoral cannulation.  EBL Minimal  Specimen(s) None

## 2021-10-05 NOTE — Progress Notes (Signed)
NAME:  Troy Randall, MRN:  [id number], DOB:  05/12/1960, LOS: 31 ADMISSION DATE:  09/04/2021, CONSULTATION DATE:  10/03/21 REFERRING MD:  Dr. Lanae Boast- [name], CHIEF COMPLAINT:  Aspiration event    History of Present Illness:  This is a 61 year old male initially admitted for GI bleeding status post embolization by IR complicated by acute cholecystitis status post percutaneous Chole drain.  Patient was found to have altered mental status, and MRI showed acute demyelinating disease.  Patient is now status post 5 doses of steroids, with no improvement.  Plan is to start Plex.  Prior to starting Plex, patient had a aspiration event, requiring ICU transfer for more respiratory status monitoring.  Pertinent  Medical History  HTN, HLD, DM, CVA on ASA/Plavix  Significant Hospital Events: Including procedures, antibiotic start and stop dates in addition to other pertinent events   8/31 presented to [facility] ED, [name] Admit, Gi Consult 9/1 CTA> no evidence of active GI bleed, 2 U PRBC 9/2 2 U PRBC, colonoscopy > no active bleed found but large clots throughout entire colon. EGD> small hiatal hernia, erosive gastropathy with no stigmata of recent bleeding, and non-bleeding duodenal diverticulum.   Underwent then small bowel enteroscopy which noted intermittent blood pumping in the proximal jejunum but was unable to be reached despite multiple attempts, was injected and clip placed. Repeat CTA repeated, which showed positive jejunal diverticular bleeding. IR> active extrav at [facility] territory, jejunal arcade branch, successful coil embo 9/3 Intubated. CTA> no evidence of active GI bleeding.  New finding of abnormal gallbladder with nondependent air, gallbladder wall air, pericholecystic and right upper quadrant inflammation.  IR placed percutaneous cholecystotomy tube.  9/4 R. IJ central line placed with dialysis triple lumen catheter 9/5 CRRT initiated 9/6 Bedside EGD without any evidence of bleeding. CTA a/p without  etiology for bleed 9/8 CRRT discontinued. Required ETT exchange yesterday due to blown cuff. 9/11 Bronchoscopic guided percutaneous tracheostomy placement.  Right IJ central line removed. 9/15 flexible sigmoidoscopy with removal of endoscopy 9/16 MRI brain w/o contrast amended study secondary to range of motion.  Rapid development numerous round white matter lesions in both cerebral hemispheres.  Prior MRIs. 9/17 MRI brain with contrast numerous contrast-enhancing bilateral white matter lesions consistent with active demyelination; steroid course completed. 9/18 Lumbar puncture  9/19 Transfer out of ICU 9/29 Rapid Response called for tachypnea, respiratory distress. Reconsult for ICU transfer after severe aspiration event on the floor.  Interim History / Subjective:  Patient is in bed upon my exam.  Patient is not able to verbally communicate with me.  Patient is able to wave to me with his right hand.  Patient not able to track me well and walk around the room.  Objective   Blood pressure 134/85, pulse 87, temperature 98 F (36.7 C), temperature source Axillary, resp. rate 20, height 6' (1.829 m), weight 126.7 kg, SpO2 100 %.    FiO2 (%):  [28 %] 28 %   Intake/Output Summary (Last 24 hours) at 10/05/2021 0946 Last data filed at 10/05/2021 0900 Gross per 24 hour  Intake 3639.93 ml  Output 1760 ml  Net 1879.93 ml   Filed Weights   10/03/21 0643 10/04/21 0403 10/05/21 0342  Weight: 124.1 kg 125.8 kg 126.7 kg    Examination:  General: Patient is resting in bed with trach collar placed Eyes: Unable to track. Neck: Trach collar in place.  No secretions noted Cardio: regular rate and rhythm, no murmurs, rubs or gallops. 2+ pulses to bilateral upper  and lower extremities  Pulmonary: High-pitched wheezing noted to bilateral lungs Abdomen: Soft, nontender with normoactive bowel sounds with no rebound or guarding  Neuro: Patient is alert, but not oriented.  Patient is nonverbal.  Able to  move right upper extremity and left upper extremity to command but not left upper or lower extremity. Skin: No rashes noted  MSK: 1/5 strength to bilateral lower extremities on hip flexion.   Resolved Hospital Problem list   AKI  - off CRRT 9/8 Bilateral nonobstructing nephrolithiasis - suspect chronic BPH Hypernatremia Septic shock Hemorrhagic shock - resolved ABLA secondary to lower GI bleed, jejunal diverticular and ulcers secondary to ischemia Acute blood loss and iron deficiency anemia, secondary to above - 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 and bedside EGD 9/6. Endoscopy capsule removal 9/15  Assessment & Plan:   #Acute aspiration event 9/29 with c/f aspiration PNA #Acute hypoxemic respiratory failure s/p percutaneous tracheostomy 9/11 S/p tracheostomy 9/11. PCCM had been following weekly for trach, called back 9/29AM for respiratory distress/increased WOB in the setting of almost certain aspiration event.  Patient is doing well from a respiratory standpoint at this time.  He is currently on 28% FiO2 with saturations at 100%.  Patient is not tachypneic.  White count has resolved. - Continue supplemental O2 support/ATC as tolerated - Wean FiO2 for O2 sat > 90% - Trach care per protocol - VAP bundle - Pulmonary hygiene - PAD protocol if returning to vent, anxiolytics PRN - Trend WBC, LA - F/u Resp Cx/TA, Bcx, legionella/urine strep -Continue cefepime (day 3), vancomycin (day3), and Flagyl (day 2) -Tube feeds have been resumed with no aspiration concerns  #Acute encephalopathy, possibly secondary to hypoxic-ischemic encephalopathy History of CVA 04/2021  without residual deficits, concern for recurrence -MRI showing stable contrast enhancing lesions consistent with active demyelinating disease with now lesion on posterior left front lobe - Neurology following - S/p Solumedrol x 5-day course, with minimal improvement of the neurological status  - PLEX per  Neuro, access for this may need to be delayed until more stable -Plan for central line access today as no growth for blood cultures in 48 hours.  -Per neuro, no one is on call for PLEX in dialysis on 10/05/21, and so first anticipated PLEX therapy will be on 10/06/21 -Continue home aspirin 81 mg daily -Continue home Crestor 40 mg daily     #E. coli Cholecystitis s/p percutaneous cholecystostomy drain placement 9/3 Percutaneous cholecystostomy drain placed and continues to have adequate drainage. - S/p ceftriaxone x 7-day course - Antibiotics (broad-spectrum) resumed as above - Will eventually need outpatient surgery/IR follow up   #T2DM, requiring insulin  As tube feeds were resumed, blood glucose has been increasing into the 200s.  Will increase Semglee to 30 units twice daily today. -Increase Semglee to 30 units twice daily -CBGs every 4 hours -Continue to titrate insulin  #Hypokalemia, resolved Potassium 3.7 this AM -Continue to monitor    #Pulmonary nodules in bilateral bases, L 81mm Family reports patient is a never smoker. Family does endorse dipping. - No further f/u needed; patient is low risk per Fleischner criteria  Best Practice (right click and "Reselect all SmartList Selections" daily)   Diet/type: tubefeeds DVT prophylaxis: prophylactic heparin  GI prophylaxis: PPI Lines: N/A Foley:  N/A Code Status:  full code  Labs   CBC: Recent Labs  Lab 10/01/21 0359 10/02/21 0355 10/03/21 0429 10/03/21 0546 10/04/21 0410 10/05/21 0344  WBC 12.5* 11.2* 15.9*  --  12.8* 9.1  NEUTROABS 9.4* 8.7* 14.5*  --  11.5* 7.9*  HGB 10.7* 10.2* 11.3* 11.2* 9.2* 8.9*  HCT 34.0* 31.2* 34.5* 33.0* 29.5* 27.4*  MCV 89.9 87.2 88.9  --  91.0 89.0  PLT 149* 156 183  --  148* 158    Basic Metabolic Panel: Recent Labs  Lab 09/30/21 0330 10/01/21 0359 10/02/21 0355 10/03/21 0429 10/03/21 0546 10/04/21 0410 10/04/21 0932 10/05/21 0344  NA 141 142  --  140 139  --  136 139  K  3.7 3.7  --  3.9 3.9  --  3.4* 3.8  CL 106 107  --  106  --   --  108 107  CO2 29 28  --  27  --   --  22 23  GLUCOSE 161* 139*  --  147*  --   --  342* 225*  BUN 21* 21*  --  17  --   --  18 17  CREATININE 0.63 0.65  --  0.77  --   --  0.81 0.63  CALCIUM 8.0* 8.2*  --  8.2*  --   --  7.4* 8.2*  MG 2.3 2.2 2.2 1.8  --  2.1  --  2.0  PHOS 3.6 3.2 2.7 3.0  --  2.7  --  2.2*   GFR: Estimated Creatinine Clearance: 136.7 mL/min (by C-G formula based on SCr of 0.63 mg/dL). Recent Labs  Lab 10/02/21 0355 10/03/21 0429 10/03/21 0816 10/03/21 1511 10/04/21 0410 10/05/21 0344  WBC 11.2* 15.9*  --   --  12.8* 9.1  LATICACIDVEN  --   --  2.1* 1.9  --   --     Liver Function Tests: Recent Labs  Lab 09/29/21 0426 09/30/21 0330 10/01/21 0359  AST 27 23 24   ALT 39 34 31  ALKPHOS 87 86 85  BILITOT 0.5 0.7 0.5  PROT 5.4* 5.1* 5.2*  ALBUMIN 2.2* 1.9* 1.9*   Recent Labs  Lab 10/03/21 0816  LIPASE 42   No results for input(s): "AMMONIA" in the last 168 hours.  ABG    Component Value Date/Time   PHART 7.476 (H) 10/03/2021 0546   PCO2ART 36.1 10/03/2021 0546   PO2ART 171 (H) 10/03/2021 0546   HCO3 26.1 10/03/2021 0546   TCO2 27 10/03/2021 0546   ACIDBASEDEF 5.0 (H) 09/08/2021 1907   O2SAT 100 10/03/2021 0546     Coagulation Profile: Recent Labs  Lab 10/03/21 0816  INR 1.0    Cardiac Enzymes: No results for input(s): "CKTOTAL", "CKMB", "CKMBINDEX", "TROPONINI" in the last 168 hours.  HbA1C: Hgb A1c MFr Bld  Date/Time Value Ref Range Status  04/29/2021 05:02 AM 6.2 (H) 4.8 - 5.6 % Final    Comment:    (NOTE) Pre diabetes:          5.7%-6.4%  Diabetes:              >6.4%  Glycemic control for   <7.0% adults with diabetes   03/31/2017 04:25 PM 9.5 (H) <5.7 % of total Hgb Final    Comment:    For someone without known diabetes, a hemoglobin A1c value of 6.5% or greater indicates that they may have  diabetes and this should be confirmed with a follow-up   test. . For someone with known diabetes, a value <7% indicates  that their diabetes is well controlled and a value  greater than or equal to 7% indicates suboptimal  control. A1c targets should be individualized based  on  duration of diabetes, age, comorbid conditions, and  other considerations. . Currently, no consensus exists regarding use of hemoglobin A1c for diagnosis of diabetes for children. .     CBG: Recent Labs  Lab 10/04/21 1525 10/04/21 1912 10/04/21 2318 10/05/21 0315 10/05/21 0725  GLUCAP 159* 185* 232* 199* 194*    Review of Systems:   Negative except stated in HPI  Past Medical History:  He,  has a past medical history of Anemia, Arthritis, CVA (cerebral vascular accident) (HCC), Diabetes mellitus without complication (HCC), Hyperlipidemia, Hypertension, and Kidney stones.   Surgical History:   Past Surgical History:  Procedure Laterality Date   ANKLE SURGERY     BIOPSY  09/10/2021   Procedure: BIOPSY;  Surgeon: Jeani Hawking, MD;  Location: [facility] ENDOSCOPY;  Service: Gastroenterology;;   COLONOSCOPY WITH PROPOFOL N/A 09/06/2021   Procedure: COLONOSCOPY WITH PROPOFOL;  Surgeon: Dolores Frame, MD;  Location: [facility] ENDO SUITE;  Service: Gastroenterology;  Laterality: N/A;   ENTEROSCOPY N/A 09/10/2021   Procedure: ENTEROSCOPY;  Surgeon: Jeani Hawking, MD;  Location: [facility] ENDOSCOPY;  Service: Gastroenterology;  Laterality: N/A;   ENTEROSCOPY  09/06/2021   Procedure: ENTEROSCOPY;  Surgeon: Dolores Frame, MD;  Location: [facility] ENDO SUITE;  Service: Gastroenterology;;   ESOPHAGOGASTRODUODENOSCOPY (EGD) WITH PROPOFOL  09/06/2021   Procedure: ESOPHAGOGASTRODUODENOSCOPY (EGD) WITH PROPOFOL;  Surgeon: Dolores Frame, MD;  Location: [facility] ENDO SUITE;  Service: Gastroenterology;;   Wenda Low SIGMOIDOSCOPY N/A 09/19/2021   Procedure: Arnell Sieving;  Surgeon: Jeani Hawking, MD;  Location: [facility] ENDOSCOPY;  Service: Gastroenterology;  Laterality: N/A;    FOREIGN BODY REMOVAL  09/19/2021   Procedure: FOREIGN BODY REMOVAL;  Surgeon: Jeani Hawking, MD;  Location: [facility] ENDOSCOPY;  Service: Gastroenterology;;   Emelda Brothers CAPSULE STUDY  09/06/2021   Procedure: GIVENS CAPSULE STUDY;  Surgeon: Dolores Frame, MD;  Location: [facility] ENDO SUITE;  Service: Gastroenterology;;   HERNIA REPAIR     umbilical x1 Incisional x1   INCISIONAL HERNIA REPAIR  04/13/2011   Procedure: LAPAROSCOPIC INCISIONAL HERNIA;  Surgeon: Dalia Heading, MD;  Location: [facility] ORS;  Service: General;  Laterality: N/A;  Recurrent Laparoscopic Incisional Herniorraphy with Mesh   IR ANGIOGRAM SELECTIVE EACH ADDITIONAL VESSEL  09/09/2021   IR ANGIOGRAM VISCERAL SELECTIVE  09/07/2021   IR ANGIOGRAM VISCERAL SELECTIVE  09/06/2021   IR ANGIOGRAM VISCERAL SELECTIVE  09/06/2021   IR EMBO ART  VEN HEMORR LYMPH EXTRAV  INC GUIDE ROADMAPPING  09/06/2021   IR GUIDED DRAIN W CATHETER PLACEMENT  09/07/2021   IR US GUIDE BX ASP/DRAIN  09/07/2021   IR US GUIDE VASC ACCESS RIGHT  09/07/2021   IR US GUIDE VASC ACCESS RIGHT  09/06/2021   KIDNEY STONE SURGERY       Social History:   reports that he has never smoked. His smokeless tobacco use includes snuff. He reports that he does not drink alcohol and does not use drugs.   Family History:  His family history includes Cancer in an other family member; Diabetes in his father; Heart attack in an other family member; Heart failure in his mother. There is no history of Anesthesia problems, Hypotension, Malignant hyperthermia, Pseudochol deficiency, Colon cancer, or Inflammatory bowel disease.   Allergies Allergies  Allergen Reactions   Metformin And Related     GI upset   Penicillins Hives     Home Medications  Prior to Admission medications   Medication Sig Start Date End Date Taking? Authorizing Provider  amLODipine (NORVASC) 10 MG tablet Take  1 tablet (10 mg total) by mouth daily. 06/15/17  Yes Caren Macadam, MD  aspirin EC 81 MG tablet Take 81 mg by mouth  daily. Swallow whole.   Yes [provider]  clopidogrel (PLAVIX) 75 MG tablet Take 1 tablet (75 mg total) by mouth daily. 04/30/21  Yes Little Ishikawa, MD  glipiZIDE (GLUCOTROL XL) 10 MG 24 hr tablet Take 1 tablet (10 mg total) by mouth daily with breakfast. 04/16/17  Yes Hagler, Apolonio Schneiders, MD  levocetirizine (XYZAL) 5 MG tablet Take 1 tablet by mouth at bedtime.   Yes [provider]  lisinopril (PRINIVIL,ZESTRIL) 40 MG tablet Take 1 tablet (40 mg total) by mouth daily. 05/12/17  Yes Caren Macadam, MD  metFORMIN (GLUCOPHAGE-XR) 500 MG 24 hr tablet Take 500-1,000 mg by mouth in the morning and at bedtime. 02/09/21  Yes [provider]  rosuvastatin (CRESTOR) 40 MG tablet Take 1 tablet (40 mg total) by mouth daily. 04/29/21  Yes Little Ishikawa, MD  tamsulosin (FLOMAX) 0.4 MG CAPS capsule Take 0.4 mg by mouth daily. 02/12/21  Yes [provider]  traZODone (DESYREL) 150 MG tablet Take 150 mg by mouth at bedtime. 02/04/21  Yes [provider]  TRESIBA FLEXTOUCH 200 UNIT/ML FlexTouch Pen Inject 20 Units into the skin in the morning and at bedtime. 03/14/21  Yes [provider]  BD VEO INSULIN SYRINGE U/F 31G X 15/64" 1 ML MISC  USE AS DIRECTED 06/08/17   Caren Macadam, MD  glucose blood (ONETOUCH VERIO) test strip TEST twice a day 05/04/17   Caren Macadam, MD  Insulin Pen Needle (NOVOTWIST) 32G X 5 MM MISC Use two daily to inject Victoza and Toujeo. 04/25/15   Elayne Snare, MD  [facility] DELICA LANCETS 23N MISC Use to check blood sugar once a day dx code E11.65 11/21/14   Elayne Snare, MD     Critical care time: [place], DO Internal Medicine Resident PGY-1 Pager: [phone]

## 2021-10-06 DIAGNOSIS — D62 Acute posthemorrhagic anemia: Secondary | ICD-10-CM | POA: Diagnosis not present

## 2021-10-06 DIAGNOSIS — N179 Acute kidney failure, unspecified: Secondary | ICD-10-CM | POA: Diagnosis not present

## 2021-10-06 DIAGNOSIS — K810 Acute cholecystitis: Secondary | ICD-10-CM | POA: Diagnosis not present

## 2021-10-06 DIAGNOSIS — D500 Iron deficiency anemia secondary to blood loss (chronic): Secondary | ICD-10-CM | POA: Diagnosis not present

## 2021-10-06 DIAGNOSIS — G9341 Metabolic encephalopathy: Secondary | ICD-10-CM | POA: Diagnosis not present

## 2021-10-06 LAB — CBC
HCT: 24.9 % — ABNORMAL LOW (ref 39.0–52.0)
Hemoglobin: 7.8 g/dL — ABNORMAL LOW (ref 13.0–17.0)
MCH: 28.1 pg (ref 26.0–34.0)
MCHC: 31.3 g/dL (ref 30.0–36.0)
MCV: 89.6 fL (ref 80.0–100.0)
Platelets: 160 10*3/uL (ref 150–400)
RBC: 2.78 MIL/uL — ABNORMAL LOW (ref 4.22–5.81)
RDW: 15.5 % (ref 11.5–15.5)
WBC: 8 10*3/uL (ref 4.0–10.5)
nRBC: 0 % (ref 0.0–0.2)

## 2021-10-06 LAB — BASIC METABOLIC PANEL
Anion gap: 5 (ref 5–15)
BUN: 11 mg/dL (ref 6–20)
CO2: 22 mmol/L (ref 22–32)
Calcium: 7.7 mg/dL — ABNORMAL LOW (ref 8.9–10.3)
Chloride: 111 mmol/L (ref 98–111)
Creatinine, Ser: 0.59 mg/dL — ABNORMAL LOW (ref 0.61–1.24)
GFR, Estimated: >60 mL/min (ref >60)
Glucose, Bld: 234 mg/dL — ABNORMAL HIGH (ref 70–99)
Potassium: 3.6 mmol/L (ref 3.5–5.1)
Sodium: 138 mmol/L (ref 135–145)

## 2021-10-06 LAB — GLUCOSE, CAPILLARY
Glucose-Capillary: 188 mg/dL — ABNORMAL HIGH (ref 70–99)
Glucose-Capillary: 218 mg/dL — ABNORMAL HIGH (ref 70–99)
Glucose-Capillary: 249 mg/dL — ABNORMAL HIGH (ref 70–99)
Glucose-Capillary: 251 mg/dL — ABNORMAL HIGH (ref 70–99)
Glucose-Capillary: 256 mg/dL — ABNORMAL HIGH (ref 70–99)
Glucose-Capillary: 265 mg/dL — ABNORMAL HIGH (ref 70–99)
Glucose-Capillary: 267 mg/dL — ABNORMAL HIGH (ref 70–99)

## 2021-10-06 LAB — CULTURE, RESPIRATORY W GRAM STAIN

## 2021-10-06 LAB — LEGIONELLA PNEUMOPHILA SEROGP 1 UR AG: L. pneumophila Serogp 1 Ur Ag: NEGATIVE

## 2021-10-06 LAB — CBC WITH DIFFERENTIAL/PLATELET
Abs Immature Granulocytes: 0.1 10*3/uL — ABNORMAL HIGH (ref 0.00–0.07)
Basophils Absolute: 0 10*3/uL (ref 0.0–0.1)
Basophils Relative: 0 %
Eosinophils Absolute: 0.1 10*3/uL (ref 0.0–0.5)
Eosinophils Relative: 1 %
HCT: 26.2 % — ABNORMAL LOW (ref 39.0–52.0)
Hemoglobin: 8.3 g/dL — ABNORMAL LOW (ref 13.0–17.0)
Immature Granulocytes: 1 %
Lymphocytes Relative: 9 %
Lymphs Abs: 0.7 10*3/uL (ref 0.7–4.0)
MCH: 28.4 pg (ref 26.0–34.0)
MCHC: 31.7 g/dL (ref 30.0–36.0)
MCV: 89.7 fL (ref 80.0–100.0)
Monocytes Absolute: 0.3 10*3/uL (ref 0.1–1.0)
Monocytes Relative: 4 %
Neutro Abs: 6.4 10*3/uL (ref 1.7–7.7)
Neutrophils Relative %: 85 %
Platelets: 147 10*3/uL — ABNORMAL LOW (ref 150–400)
RBC: 2.92 MIL/uL — ABNORMAL LOW (ref 4.22–5.81)
RDW: 15.5 % (ref 11.5–15.5)
WBC: 7.7 10*3/uL (ref 4.0–10.5)
nRBC: 0 % (ref 0.0–0.2)

## 2021-10-06 LAB — MAGNESIUM: Magnesium: 1.9 mg/dL (ref 1.7–2.4)

## 2021-10-06 LAB — PHOSPHORUS: Phosphorus: 1.9 mg/dL — ABNORMAL LOW (ref 2.5–4.6)

## 2021-10-06 MED ORDER — FREE WATER
250.0000 mL | Freq: Four times a day (QID) | Status: DC
  Administered 2021-10-06 – 2021-10-22 (×56): 250 mL

## 2021-10-06 MED ORDER — CALCIUM GLUCONATE 10 % IV SOLN: 2.0000 g | Freq: Once | INTRAVENOUS | Status: AC

## 2021-10-06 MED ORDER — HYDRALAZINE HCL 20 MG/ML IJ SOLN
10.0000 mg | Freq: Four times a day (QID) | INTRAMUSCULAR | Status: DC | PRN
  Filled 2021-10-06: qty 1

## 2021-10-06 MED ORDER — HEPARIN SODIUM (PORCINE) 1000 UNIT/ML IJ SOLN: 1000.0000 [IU] | Freq: Once | INTRAMUSCULAR | Status: AC

## 2021-10-06 MED ORDER — POTASSIUM PHOSPHATES 15 MMOLE/5ML IV SOLN
20.0000 mmol | Freq: Once | INTRAVENOUS | Status: AC
  Administered 2021-10-06: 20 mmol via INTRAVENOUS
  Filled 2021-10-06: qty 6.67

## 2021-10-06 MED ORDER — SODIUM CHLORIDE 0.9 % IV SOLN
2.0000 g | INTRAVENOUS | Status: AC
  Administered 2021-10-06 – 2021-10-09 (×4): 2 g via INTRAVENOUS
  Filled 2021-10-06 (×4): qty 20

## 2021-10-06 MED ORDER — CALCIUM GLUCONATE-NACL 2-0.675 GM/100ML-% IV SOLN
2.0000 g | Freq: Once | INTRAVENOUS | Status: AC
  Administered 2021-10-06: 2000 mg via INTRAVENOUS
  Filled 2021-10-06 (×2): qty 100

## 2021-10-06 MED ORDER — SODIUM CHLORIDE 0.9 % IV SOLN
Freq: Once | INTRAVENOUS | Status: AC
  Filled 2021-10-06 (×6): qty 200

## 2021-10-06 MED ORDER — HEPARIN SODIUM (PORCINE) 1000 UNIT/ML IJ SOLN
1000.0000 [IU] | Freq: Once | INTRAMUSCULAR | Status: AC
  Administered 2021-10-06: 1000 [IU]

## 2021-10-06 MED ORDER — ACD FORMULA A 0.73-2.45-2.2 GM/100ML VI SOLN
1000.0000 mL | Status: DC
  Filled 2021-10-06: qty 1000

## 2021-10-06 MED ORDER — HEPARIN SODIUM (PORCINE) 1000 UNIT/ML IJ SOLN
INTRAMUSCULAR | Status: AC
  Administered 2021-10-06: 1000 [IU]
  Filled 2021-10-06: qty 3

## 2021-10-06 MED ORDER — DIPHENHYDRAMINE HCL 25 MG PO CAPS
25.0000 mg | ORAL_CAPSULE | Freq: Four times a day (QID) | ORAL | Status: DC | PRN
  Administered 2021-10-06: 25 mg via ORAL
  Filled 2021-10-06: qty 1

## 2021-10-06 MED ORDER — SODIUM CHLORIDE 0.9 % IV SOLN: INTRAVENOUS | Status: DC | PRN

## 2021-10-06 MED ORDER — ACD FORMULA A 0.73-2.45-2.2 GM/100ML VI SOLN
1000.0000 mL | Status: DC
  Administered 2021-10-06: 1000 mL
  Filled 2021-10-06 (×3): qty 1000

## 2021-10-06 MED ORDER — ACETAMINOPHEN 325 MG PO TABS
650.0000 mg | ORAL_TABLET | ORAL | Status: DC | PRN
  Administered 2021-10-06: 650 mg via ORAL
  Filled 2021-10-06: qty 2

## 2021-10-06 MED ORDER — SODIUM CHLORIDE 0.9 % IV SOLN: Freq: Once | INTRAVENOUS | Status: AC

## 2021-10-06 MED ORDER — CALCIUM GLUCONATE-NACL 2-0.675 GM/100ML-% IV SOLN: 2.0000 g | Freq: Once | INTRAVENOUS | Status: AC

## 2021-10-06 MED ORDER — DIPHENHYDRAMINE HCL 25 MG PO CAPS: 25.0000 mg | ORAL_CAPSULE | Freq: Four times a day (QID) | ORAL | Status: DC | PRN

## 2021-10-06 MED ORDER — CALCIUM CARBONATE ANTACID 500 MG PO CHEW
2.0000 | CHEWABLE_TABLET | ORAL | Status: AC
  Administered 2021-10-06: 400 mg via ORAL
  Filled 2021-10-06 (×2): qty 2

## 2021-10-06 MED ORDER — ACETAMINOPHEN 325 MG PO TABS: 650.0000 mg | ORAL_TABLET | ORAL | Status: DC | PRN

## 2021-10-06 NOTE — Progress Notes (Signed)
Pharmacy Antibiotic Note  Troy Randall is a 61 y.o. male s/p tracheostomy with respiratory distress/aspiration Pharmacy has been consulted for vancomycin. TA with moderate MRSA and rare e coli.\  Plan: Continue vancomycin 1500 mg IV q12h Ceftriaxone per MD  Plan for 7 days of treatment - stop dates placed Monitor renal function  Height: 6' (182.9 cm) Weight: 125.1 kg (275 lb 12.7 oz) IBW/kg (Calculated) : 77.6  Temp (24hrs), Avg:97.9 F (36.6 C), Min:97.3 F (36.3 C), Max:98.4 F (36.9 C)  Recent Labs  Lab 10/01/21 0359 10/02/21 0355 10/03/21 0429 10/03/21 0816 10/03/21 1511 10/04/21 0410 10/04/21 0932 10/05/21 0344 10/06/21 0141 10/06/21 0307 10/06/21 1230  WBC 12.5*   < > 15.9*  --   --  12.8*  --  9.1 7.7  --  8.0  CREATININE 0.65  --  0.77  --   --   --  0.81 0.63  --  0.59*  --   LATICACIDVEN  --   --   --  2.1* 1.9  --   --   --   --   --   --    < > = values in this interval not displayed.     Estimated Creatinine Clearance: 135.8 mL/min (A) (by C-G formula based on SCr of 0.59 mg/dL (L)).    Allergies  Allergen Reactions   Metformin And Related     GI upset   Penicillins Hives   Cristela Felt, PharmD, BCPS Clinical Pharmacist 10/06/2021 2:27 PM

## 2021-10-06 NOTE — Progress Notes (Signed)
[name] Progress Note Patient Name: Troy Randall DOB: October 07, 1960 MRN: [id number]   Date of Service  10/06/2021  HPI/Events of Note  Pt with some blood in flexiseal  eICU Interventions  Hgb stable. No transfusions indicated.  Will hold heparin [place] for now.         [place] 10/06/2021, 6:29 AM

## 2021-10-06 NOTE — Progress Notes (Signed)
PT Cancellation Note  Patient Details Name: LAYTH CEREZO MRN: [id number] DOB: Mar 09, 1960   Cancelled Treatment:    Reason Eval/Treat Not Completed: Patient not medically ready.  Rectal bleeding and significant decrease in hemoglobin. 10/06/2021  Ginger Carne., PT Acute Rehabilitation Services [phone]  (office)   Tessie Fass Cicely Ortner 10/06/2021, 1:45 PM

## 2021-10-06 NOTE — Progress Notes (Signed)
Subjective: A GI consult was requested on the patient as he had started to have rectal bleeding in the flexiseal tube since last night.  Patient had multiple endoscopic procedures including a colonoscopy on 09/06/2021 when there was large amount of blood in the colon no definite source of bleeding was identified.  An EGD done on the same day revealed small hiatal hernia with multiple large erosions in the stomach and the small bowel endoscopy on the day revealed blood in the jejunum and multiple ulcerations in the stomach as mentioned above.  Small bowel enteroscopy done on 09/10/2021 revealed ulcerations in the jejunum with the site of the Hemoclip being identified biopsies were done.  A flexible sigmoidoscopy was done on 09/19/2021 remove the retained capsule from the rectum.  Patient's been hemodynamically stable with no evidence of hypotension or tachycardia since the bleeding started.  His hemoglobin is dropped from 7 8.3 g/dL to 7.8 g/dL since last night.  He denies having ever any abdominal pain.  As per my discussion with Dr. Rory Percy neurology plans were to start plasmapheresis for innumerable supratentorial contrast-enhancing lesions.  His Aspirin has been held but he did get a dose of heparin today in preparation for the PLEX.  Patient had a IR 6 angiogram which was visually selective but in spite of exhaustive efforts and superior mesenteric arteriogram with selective and arteriograms of 2 left-sided jejunal branches of the SMA no discrete source of bleeding could be identified and no embolization was performed on 09/08/2021.  A CT angiogram done on 09/10/2021 showed no AAA discrete areas of intraluminal contrast extravasation to suggest recurrent bleeding.  CTA done on 09/05/2021 revealed no evidence of active GI bleeding and aspirin patient received 2 units of packed red blood cells.   Objective: Vital signs in last 24 hours: Temp:  [97.3 F (36.3 C)-98.4 F (36.9 C)] 97.4 F (36.3 C) (10/02 1129) Pulse  Rate:  [83-101] 100 (10/02 1151) Resp:  [13-25] 21 (10/02 1151) BP: (105-167)/(47-118) 150/77 (10/02 1151) SpO2:  [98 %-100 %] 100 % (10/02 1157) FiO2 (%):  [21 %-28 %] 21 % (10/02 1157) Weight:  [124.2 kg-125.1 kg] 125.1 kg (10/02 0800) Last BM Date : 10/06/21  Intake/Output from previous day: 10/01 0701 - 10/02 0700 In: 3213.6 [I.V.:229.9; NG/GT:1399.5; IV Piggyback:1584.2] Out: 2620 [Urine:2320; Stool:300] Intake/Output this shift: Total I/O In: 458.2 [I.V.:26.3; NG/GT:330; IV Piggyback:101.9] Out: 355 [Urine:355]  General appearance: no distress and morbidly obese Resp: clear to auscultation bilaterally Cardio: regular rate and rhythm, S1, S2 normal, no murmur, click, rub or gallop GI: soft, non-tender; bowel sounds normal; no masses,  no organomegaly  Lab Results: Recent Labs    10/04/21 0410 10/05/21 0344 10/06/21 0141  WBC 12.8* 9.1 7.7  HGB 9.2* 8.9* 8.3*  HCT 29.5* 27.4* 26.2*  PLT 148* 158 147*   BMET Recent Labs    10/04/21 0932 10/05/21 0344 10/06/21 0307  NA 136 139 138  K 3.4* 3.8 3.6  CL 108 107 111  CO2 22 23 22   GLUCOSE 342* 225* 234*  BUN 18 17 11   CREATININE 0.81 0.63 0.59*  CALCIUM 7.4* 8.2* 7.7*  Studies/Results: DG CHEST PORT 1 VIEW  Result Date: 10/05/2021 CLINICAL DATA:  Rectal bleeding. EXAM: PORTABLE CHEST 1 VIEW COMPARISON:  10/03/2021 FINDINGS: Tracheostomy tube tip is above the carina. There is a feeding tube with tip coursing below the level of the hemidiaphragms. Right arm PICC line tip is at the cavoatrial junction. There is a right IJ catheter with tip  in the distal SVC. Lung volumes are low. No pleural effusion or airspace disease. No interstitial edema. IMPRESSION: 1. Low lung volumes. 2. Support apparatus positioned as above. Electronically Signed   By: Signa Kell M.D.   On: 10/05/2021 12:20    Medications: I have reviewed the patient's current medications. Prior to Admission:  Medications Prior to Admission  Medication  Sig Dispense Refill Last Dose   amLODipine (NORVASC) 10 MG tablet Take 1 tablet (10 mg total) by mouth daily. 90 tablet 3 09/04/2021   aspirin EC 81 MG tablet Take 81 mg by mouth daily. Swallow whole.   09/04/2021   clopidogrel (PLAVIX) 75 MG tablet Take 1 tablet (75 mg total) by mouth daily. 30 tablet 0 09/04/2021 at 0730   glipiZIDE (GLUCOTROL XL) 10 MG 24 hr tablet Take 1 tablet (10 mg total) by mouth daily with breakfast. 90 tablet 1 09/03/2021   levocetirizine (XYZAL) 5 MG tablet Take 1 tablet by mouth at bedtime.   09/03/2021   lisinopril (PRINIVIL,ZESTRIL) 40 MG tablet Take 1 tablet (40 mg total) by mouth daily. 90 tablet 3 09/04/2021   metFORMIN (GLUCOPHAGE-XR) 500 MG 24 hr tablet Take 500-1,000 mg by mouth in the morning and at bedtime.   09/04/2021   rosuvastatin (CRESTOR) 40 MG tablet Take 1 tablet (40 mg total) by mouth daily. 30 tablet 1 09/04/2021   tamsulosin (FLOMAX) 0.4 MG CAPS capsule Take 0.4 mg by mouth daily.   09/03/2021   traZODone (DESYREL) 150 MG tablet Take 150 mg by mouth at bedtime.   09/03/2021   TRESIBA FLEXTOUCH 200 UNIT/ML FlexTouch Pen Inject 20 Units into the skin in the morning and at bedtime.   09/04/2021   BD VEO INSULIN SYRINGE U/F 31G X 15/64" 1 ML MISC  USE AS DIRECTED 100 each 2    glucose blood (ONETOUCH VERIO) test strip TEST twice a day 100 each 3    Insulin Pen Needle (NOVOTWIST) 32G X 5 MM MISC Use two daily to inject Victoza and Toujeo. 90 each 5    ONETOUCH DELICA LANCETS 33G MISC Use to check blood sugar once a day dx code E11.65 50 each 3    Scheduled:  Chlorhexidine Gluconate Cloth  6 each Topical Daily   docusate  100 mg Per Tube Daily   feeding supplement (PROSource TF20)  60 mL Per Tube BID   free water  250 mL Per Tube Q6H   heparin sodium (porcine)       heparin sodium (porcine)  1,000 Units Intracatheter Once   influenza vac split quadrivalent PF  0.5 mL Intramuscular Tomorrow-1000   insulin aspart  0-15 Units Subcutaneous Q4H   insulin aspart   0-5 Units Subcutaneous QHS   insulin glargine-yfgn  30 Units Subcutaneous BID   mouth rinse  15 mL Mouth Rinse 4 times per day   pantoprazole  40 mg Per Tube BID   pneumococcal 23 valent vaccine  0.5 mL Intramuscular Tomorrow-1000   polyethylene glycol  17 g Per Tube Daily   rosuvastatin  40 mg Per Tube Daily   sodium chloride flush  10-40 mL Intracatheter Q12H   sodium chloride flush  5 mL Intracatheter Q8H   Zinc Oxide   Topical BID   Continuous:  sodium chloride 10 mL/hr at 10/06/21 1500   sodium chloride Stopped (10/03/21 1153)   albumin human 25 % 50 g in sodium chloride 0.9 %     calcium gluconate     cefTRIAXone (ROCEPHIN)  IV  2 g (10/06/21 1508)   citrate dextrose     dextrose Stopped (10/04/21 1008)   feeding supplement (VITAL 1.5 CAL) 60 mL/hr at 10/06/21 1500   lactated ringers     potassium PHOSPHATE IVPB (in mmol) 20 mmol (10/06/21 1509)   vancomycin Stopped (10/06/21 1140)   FXJ:OITGPQ chloride, acetaminophen, dextrose, diphenhydrAMINE, heparin sodium (porcine), hydrALAZINE, lactated ringers, ondansetron **OR** ondansetron (ZOFRAN) IV, mouth rinse, sodium chloride flush  Assessment/Plan: 1) Rectal bleeding with posthemorrhagic anemia-patient has had multiple endoscopic procedures of the last month. I suspect this is stress ulceration/ischemic disease; due to his multiple comorbidities a complete colonoscopy prep will not be possible and therefore a limited flex sig with a couple of enemas might be helpful. Limitations in doing further GI evaluation of been discussed with his wife by me today.  2) E. coli cholecystitis status post percutaneous cholecystostomy placement.  3) Acute metabolic encephalopathy MRI of the brain showed active demyelinating disease bilaterally that has not responded to IV steroids for the last 5 days.  PLEX treatment plan catheter placed. 4) Acute hypoxemic respiratory failure with tracheostomy on 09/15/21.  Patient transferred back to the ICU with  MRSA and E. coli infection in the trach culture; now on vancomycin and ceftriaxone. 4) AODM. 5) AKI/hypernatremia. 6) HTN/Morbid obesity. As per my discussion with Dr. Sharl Ma and Dr. Wilford Corner I think we clearly need to establish goals in this case and involve our palliative care as the patient does not seem to be progressing despite multiple interventions clearly listed above.  LOS: 32 days   Charna Elizabeth 10/06/2021, 12:42 PM

## 2021-10-06 NOTE — Progress Notes (Signed)
[facility]  PROGRESS NOTE  Troy Randall AYT:[id number] DOB: 02-20-1960 DOA: 09/04/2021 PCP: Benita Stabile, MD   Brief HPI:   61 years old male with medical history of hypertension, hyperlipidemia, type 2 diabetes, CVA, presented to the hospital with GI bleed.  Patient underwent IR embolization followed by percutaneous cholecystostomy tube placement due to ongoing altered mental status.  MRI of the brain showed numerous contrast-enhancing bilateral white matter lesions concerning for demyelinating disease.  Neurology was consulted and underwent lumbar puncture on 09/22/2021 with low suspicion for infectious etiology.  Neurology has initiated the patient on high-dose steroids on 09/23/2021.  Patient was then transferred to hospitalist service on 09/24/2021.    Significant events. 8/31 presented to [facility] ED, [name] Admit, Gi Consult 9/1 CTA> no evidence of active GI bleed, 2 U PRBC 9/2 2 U PRBC, colonoscopy > no active bleed found but large clots throughout entire colon. EGD> small hiatal hernia, erosive gastropathy with no stigmata of recent bleeding, and non-bleeding duodenal diverticulum.   Underwent then small bowel enteroscopy which noted intermittent blood pumping in the proximal jejunum but was unable to be reached despite multiple attempts, was injected and clip placed. Repeat CTA repeated, which showed positive jejunal diverticular bleeding. IR> active extrav at [facility] territory, jejunal arcade branch, successful coil embo 9/3 Intubated. CTA> no evidence of active GI bleeding.  New finding of abnormal gallbladder with nondependent air, gallbladder wall air, pericholecystic and right upper quadrant information.  IR placed percutaneous cholecystotomy tube.  9/4 R. IJ central line placed with dialysis triple lumen catheter 9/5 CRRT initiated 9/6 Bedside EGD without any evidence of bleeding. CTA a/p without etiology for bleed 9/8 CRRT discontinued. Required ETT exchange yesterday due to blown  cuff. 9/11 Bronchoscopic guided percutaneous tracheostomy placement.  Right IJ central line removed. 9/15 flexible sigmoidoscopy with removal of endoscopy 9/16 MRI brain w/o contrast amended study secondary to range of motion.  Rapid development numerous round white matter lesions in both cerebral hemispheres.  Prior MRIs. 9/17 MRI brain: Numerous contrast-enhancing bilateral white matter lesions, consistent with active demyelination. 9/18 Lumbar puncture  9/19 MRI cervical spine, thoracic : Limited exam, within limitations there is not definitive evidence of active demyelinization. Within limitations of motion artifact, there may be demyelinating lesions at the C6 and T1 vertebral body levels. -S/p  LP on 9/18. 9/25-clinical exam not improved despite completion of 5-day IV steroids.  Neurology planning to repeat MRI of the brain cervical spine thoracic spine. Possible IR consult for catheter placement for possible Plex therapy if abnormal scans 9/26- scans attempted with ativan 9/29-patient transferred to ICU for respiratory distress due to aspiration pneumonia with sepsis 10/2-patient transferred back to [facility]; started having GI bleed, Dr. Mann/Dr. Elnoria Howard reconsulted        Subjective   Patient seen and examined, nonverbal, not following commands.   Assessment/Plan:    Acute metabolic encephalopathy -MRI brain showed bilateral white matter lesions concerning for active demyelinating disease -S/p IV steroids for 5 days with no significant improvement -Neurology planning for Plex x5 session, HD catheter placed  Hemorrhagic shock/GI bleed -Patient has history of GI bleed, likely jejunal, diverticular and ulcer secondary to ischemia -S/p 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 on 9/2, bedside EGD 9/6, endoscopic capsule removal 9/15 -He started having frank GI bleed today again, GI has been re consulted -Dr. Loreta Ave to see patient today -Repeat CBC showed hemoglobin of 7.8  Acute  hypoxemic respiratory failure s/p tracheostomy 9/11 -Patient had tracheostomy  placed on 9/11, followed by PCCM weekly for trach -Developed respiratory distress on 9:29 AM with increased work of breathing in setting of aspiration event -Trach culture grew MRSA and E. coli, will switch antibiotics to vancomycin and ceftriaxone  E. coli cholecystitis s/p percutaneous cholecystostomy drain placement -9/3 percutaneous cholecystostomy drain placement and continues to have adequate drainage -S/p ceftriaxone for 7-day course -Antibiotics have been resumed as above -Will eventually need outpatient surgery/IR follow-up  Diabetes mellitus type 2 -Tube feeds have been resumed -CBG has been elevated in 200s -Continue Semglee 30 units subcu twice daily -Moderate sliding scale insulin with NovoLog -Continue checking CBGs every 4 hours  Acute kidney injury -S/p CRRT; discontinued on 9/8  Hypernatremia -Continue free water 400 cc every 6 hours  Hypertension -Continue amlodipine -Blood pressure well controlled -We will continue to hold lisinopril  Morbid obesity -BMI 37.49 kg/m    Medications     aspirin  81 mg Per Tube Daily   Chlorhexidine Gluconate Cloth  6 each Topical Daily   docusate  100 mg Per Tube Daily   feeding supplement (PROSource TF20)  60 mL Per Tube BID   free water  250 mL Per Tube Q6H   heparin sodium (porcine)       heparin sodium (porcine)  1,000 Units Intracatheter Once   influenza vac split quadrivalent PF  0.5 mL Intramuscular Tomorrow-1000   insulin aspart  0-15 Units Subcutaneous Q4H   insulin aspart  0-5 Units Subcutaneous QHS   insulin glargine-yfgn  30 Units Subcutaneous BID   mouth rinse  15 mL Mouth Rinse 4 times per day   pantoprazole  40 mg Per Tube BID   pneumococcal 23 valent vaccine  0.5 mL Intramuscular Tomorrow-1000   polyethylene glycol  17 g Per Tube Daily   rosuvastatin  40 mg Per Tube Daily   sodium chloride flush  10-40 mL Intracatheter  Q12H   sodium chloride flush  5 mL Intracatheter Q8H   Zinc Oxide   Topical BID     Data Reviewed:   CBG:  Recent Labs  Lab 10/06/21 0049 10/06/21 0319 10/06/21 0730 10/06/21 1126 10/06/21 1521  GLUCAP 218* 188* 249* 265* 267*    SpO2: 100 % O2 Flow Rate (L/min): 5 L/min FiO2 (%): 21 %    Vitals:   10/06/21 1200 10/06/21 1300 10/06/21 1400 10/06/21 1526  BP: (!) 148/81 134/83 (!) 146/75   Pulse: 99 (!) 102 97   Resp: (!) 21 19 (!) 21   Temp:    97.6 F (36.4 C)  TempSrc:    Axillary  SpO2: 100% 100% 100%   Weight:      Height:          Data Reviewed:  Basic Metabolic Panel: Recent Labs  Lab 10/01/21 0359 10/02/21 0355 10/03/21 0429 10/03/21 0546 10/04/21 0410 10/04/21 0932 10/05/21 0344 10/06/21 0307  NA 142  --  140 139  --  136 139 138  K 3.7  --  3.9 3.9  --  3.4* 3.8 3.6  CL 107  --  106  --   --  108 107 111  CO2 28  --  27  --   --  22 23 22   GLUCOSE 139*  --  147*  --   --  342* 225* 234*  BUN 21*  --  17  --   --  18 17 11   CREATININE 0.65  --  0.77  --   --  0.81 0.63  0.59*  CALCIUM 8.2*  --  8.2*  --   --  7.4* 8.2* 7.7*  MG 2.2 2.2 1.8  --  2.1  --  2.0 1.9  PHOS 3.2 2.7 3.0  --  2.7  --  2.2* 1.9*    CBC: Recent Labs  Lab 10/02/21 0355 10/03/21 0429 10/03/21 0546 10/04/21 0410 10/05/21 0344 10/06/21 0141 10/06/21 1230  WBC 11.2* 15.9*  --  12.8* 9.1 7.7 8.0  NEUTROABS 8.7* 14.5*  --  11.5* 7.9* 6.4  --   HGB 10.2* 11.3* 11.2* 9.2* 8.9* 8.3* 7.8*  HCT 31.2* 34.5* 33.0* 29.5* 27.4* 26.2* 24.9*  MCV 87.2 88.9  --  91.0 89.0 89.7 89.6  PLT 156 183  --  148* 158 147* 160    LFT Recent Labs  Lab 09/30/21 0330 10/01/21 0359  AST 23 24  ALT 34 31  ALKPHOS 86 85  BILITOT 0.7 0.5  PROT 5.1* 5.2*  ALBUMIN 1.9* 1.9*     Antibiotics: Anti-infectives (From admission, onward)    Start     Dose/Rate Route Frequency Ordered Stop   10/06/21 1515  cefTRIAXone (ROCEPHIN) 2 g in sodium chloride 0.9 % 100 mL IVPB        2  g 200 mL/hr over 30 Minutes Intravenous Every 24 hours 10/06/21 1420 10/10/21 1514   10/04/21 0945  metroNIDAZOLE (FLAGYL) IVPB 500 mg  Status:  Discontinued        500 mg 100 mL/hr over 60 Minutes Intravenous Every 12 hours 10/04/21 0853 10/06/21 1329   10/03/21 2200  vancomycin (VANCOREADY) IVPB 1500 mg/300 mL        1,500 mg 150 mL/hr over 120 Minutes Intravenous Every 12 hours 10/03/21 0613 10/09/21 2359   10/03/21 0700  vancomycin (VANCOREADY) IVPB 2000 mg/400 mL        2,000 mg 200 mL/hr over 120 Minutes Intravenous  Once 10/03/21 0613 10/03/21 1149   10/03/21 0700  ceFEPIme (MAXIPIME) 2 g in sodium chloride 0.9 % 100 mL IVPB  Status:  Discontinued        2 g 200 mL/hr over 30 Minutes Intravenous Every 8 hours 10/03/21 0613 10/06/21 1329   10/03/21 0645  vancomycin (VANCOREADY) IVPB 2000 mg/400 mL  Status:  Discontinued        2,000 mg 200 mL/hr over 120 Minutes Intravenous  Once 10/03/21 0549 10/03/21 0748   10/03/21 0645  ceFEPIme (MAXIPIME) 2 g in sodium chloride 0.9 % 100 mL IVPB  Status:  Discontinued        2 g 200 mL/hr over 30 Minutes Intravenous  Once 10/03/21 0549 10/03/21 1121   10/03/21 0600  vancomycin (VANCOREADY) IVPB 1500 mg/300 mL  Status:  Discontinued        1,500 mg 150 mL/hr over 120 Minutes Intravenous On call 10/02/21 1334 10/03/21 0549   09/11/21 1100  cefTRIAXone (ROCEPHIN) 2 g in sodium chloride 0.9 % 100 mL IVPB        2 g 200 mL/hr over 30 Minutes Intravenous Every 24 hours 09/11/21 1010 09/13/21 1108   09/08/21 0200  piperacillin-tazobactam (ZOSYN) IVPB 3.375 g  Status:  Discontinued        3.375 g 12.5 mL/hr over 240 Minutes Intravenous Every 8 hours 09/07/21 1856 09/07/21 2114   09/07/21 2200  meropenem (MERREM) 1 g in sodium chloride 0.9 % 100 mL IVPB  Status:  Discontinued        1 g 200 mL/hr over 30 Minutes Intravenous Every 8  hours 09/07/21 2114 09/11/21 1010   09/07/21 1945  piperacillin-tazobactam (ZOSYN) IVPB 3.375 g        3.375 g 100  mL/hr over 30 Minutes Intravenous  Once 09/07/21 1856 09/07/21 2005   09/07/21 1753  cefTRIAXone (ROCEPHIN) injection         Intravenous As needed 09/07/21 1754 09/07/21 1753   09/07/21 1749  sodium chloride 0.9 % with cefTRIAXone (ROCEPHIN) ADS Med       Note to Pharmacy: Eveline Keto E: cabinet override      09/07/21 1749 09/07/21 1935        DVT prophylaxis: SCDs  Code Status: Full code  Family Communication: Discussed with patient wife at bedside   CONSULTS gastroenterology, neurology, PCCM   Objective    Physical Examination:   General: Appears in no acute distress Cardiovascular: S1-S2, regular, no murmur auscultated Respiratory: Clear to auscultation bilaterally Abdomen: Abdomen is soft, nontender, no organomegaly Extremities: No edema in the lower extremities Neurologic: Alert, nonverbal, not following commands   Status is: Inpatient:             [name]   [facility] If 7PM-7AM, please contact night-coverage at [email or web address], Office  [phone]   10/06/2021, 3:36 PM  LOS: 32 days

## 2021-10-06 NOTE — Progress Notes (Signed)
Received patient in bed to unit.  Alert and oriented.  Informed consent signed and in chart.   Treatment initiated: 335p Treatment completed: 545p  Patient tolerated well.  Transported back to the room  Alert, without acute distress.  Hand-off given to patient's nurse.   Access used: Right Internal Jugular Access issues: No  Total UF removed: 1638 Medication(s) given: Calcium Gluconate 2 Post HD VS: 140/68, 97.8 Axillary,93, 22 Spo2 100 Post HD weight: No   [facility] Received patient in bed to unit.  Alert and oriented.  Informed consent signed and in chart.   Treatment initiated: 0 Treatment completed: 0  Patient tolerated well.  Transported back to the room  Alert, without acute distress.  Hand-off given to patient's nurse.   Access used: 0 Access issues: No  Total UF removed: 0 Medication(s) given: 0 Post HD VS: 0 Post HD weight: [facility]

## 2021-10-06 NOTE — Progress Notes (Signed)
Nutrition Follow-up  DOCUMENTATION CODES:   Obesity unspecified  INTERVENTION:   Continue tube feeds via gastric Cortrak: - Vital 1.5 @ 60 ml/hr (1440 ml/day) - PROSource TF20 60 ml BID - Decrease free water flushes to 250 ml q 6 hours  Tube feeding regimen provides 2320 kcal, 137 grams of protein, and 2100 ml of H2O.   NUTRITION DIAGNOSIS:   Increased nutrient needs related to acute illness as evidenced by estimated needs.  Ongoing, being addressed via TF  GOAL:   Patient will meet greater than or equal to 90% of their needs  Met via TF at goal  MONITOR:   TF tolerance  REASON FOR ASSESSMENT:   Consult Enteral/tube feeding initiation and management  ASSESSMENT:   Pt with PMH of HTN, HLD, DM, CVA on ASA/plavix admitted 8/31 from [facility] due to GI bleed.  08/31 - admit 09/02 - s/p colonoscopy showing no active bleed but large clots throughout entire colon; s/p EGD showing small hital hernia, erosive gastropathy, non-bleeding duodenal diverticulum; s/p small bowel enteroscopy showing jejunal diverticular bleeding s/p coil embolization  09/03 - intubated; new cholecystitis s/p IR for cholecystectomy tube  09/04 - CRRT start 09/08 - CRRT stop 09/11 - s/p trach 09/12 - Cortrak placed (tip in descending duodenum) 09/15 - s/p flexible sigmoidoscopy 09/18 - s/p LP 09/29 - concern for aspiration due to bilious discharge from trach site, TF held, transferred to ICU 09/30 - TF resumed 10/01 - s/p HD catheter placement for PLEX 10/02 - first PLEX  Discussed pt with RN and during ICU rounds. Pt starting PLEX today. Based on most recent abdominal x-ray on 9/29, Cortrak is no long post-pyloric but is in the distal stomach. Pt currently tolerating tube feeds at goal per RN.  Spoke with pt's wife at bedside. All questions regarding TF answered.  Pt's weight down ~14 kg since admission. Weight has been stable over the last 1-2 weeks. Question accuracy of admission weight. Pt  continues to have edema to BUE and BLE. Will decrease volume of FWF.  Admit weight: 139.3 kg Current weight: 125.1 kg  Pt with edema to BUE and BLE.  Current TF: Vital 1.5 @ 60 ml/hr, PROSource TF20 60 ml BID, free water flushes of 400 ml q 6 hours  Medications reviewed and include: calcium carbonate, colace, SSI, semglee 30 units BID, protonix, miralax, IV abx  Labs reviewed: creatinine 0.59, phosphorus 1.9, hemoglobin 8.3 CBG's: 167-264 x 24 hours  UOP: 2320 ml x 24 hours Stool: 300 ml + 2 unmeasured occurrences x 24 hours I/O's: +10.6 L since admit  Diet Order:   Diet Order             Diet NPO time specified  Diet effective now                   EDUCATION NEEDS:   No education needs have been identified at this time  Skin:  Skin Assessment: Reviewed RN Assessment (MASD to rectum)  Last BM:  10/06/21 rectal tube  Height:   Ht Readings from Last 1 Encounters:  10/06/21 6' (1.829 m)    Weight:   Wt Readings from Last 1 Encounters:  10/06/21 125.1 kg    Ideal Body Weight:  80.9 kg  BMI:  Body mass index is 37.4 kg/m.  Estimated Nutritional Needs:   Kcal:  2200-2500  Protein:  130-150 grams  Fluid:  >2 L/day    Gustavus Bryant, MS, RD, LDN Inpatient Clinical Dietitian Please see Shea Evans  for contact information.

## 2021-10-06 NOTE — Progress Notes (Signed)
Neurology Progress Note  Subjective: Seen in room with wife at the bedside. Bloody draining from flexiseal. Hgb 8.3 today. Plan for PLEX today.   Objective: Current vital signs: BP 131/68   Pulse 93   Temp 98.1 F (36.7 C) (Axillary)   Resp 15   Ht 6' (1.829 m)   Wt 124.2 kg   SpO2 98%   BMI 37.14 kg/m  Vital signs in last 24 hours: Temp:  [97.3 F (36.3 C)-98.4 F (36.9 C)] 98.1 F (36.7 C) (10/02 0734) Pulse Rate:  [83-101] 93 (10/02 0700) Resp:  [12-25] 15 (10/02 0700) BP: (105-167)/(47-118) 131/68 (10/02 0700) SpO2:  [98 %-100 %] 98 % (10/02 0700) FiO2 (%):  [21 %-28 %] 21 % (10/02 0415) Weight:  [124.2 kg] 124.2 kg (10/02 0500)  Intake/Output from previous day: 10/01 0701 - 10/02 0700 In: 3213.6 [I.V.:229.9; NG/GT:1399.5; IV Piggyback:1584.2] Out: 2620 [Urine:2320; Stool:300] Intake/Output this shift: No intake/output data recorded. Nutritional status:  Diet Order             Diet NPO time specified  Diet effective now                  HEENT: [place]/AT. Trach collar is noted with nonbloody secretions Ext: No edema   Neurologic Exam: Ment: Awake with eyes open, looking upward.  Intermittently tracks examiner, more on the right today.  Still with no attempts to speak. Able to move all extremities without gravity. Intermittently following commands- snaps fingers on the right but does not wiggle toes when asked.  CN: PERRL. Fixates briefly. Eyes are conjugate. Will intermittently track over a limited range of EOM. Face is symmetric.  Motor/Sensory: Spontaneous movement without gravity throughout. Withdraws to noxious stimuli briskly throughout, does not participate in confrontational strength testing. Cerebellar/Gait: Unable to assess  Lab Results Reviewed:  WBC 15.9 -> 12.8 -> 9.1 Hgb 11.3 -> 9.2 -> 8.9 -> 8.3  - LP was performed by CCM on 9/18. CSF findings: - Glucose 126, RBC count 1-5 (unremarkable), clear, colorless, total protein normal, WBC normal.   - Gram stain: Negative - Fungal and bacterial cultures pending - Infectious disease Biofire panel negative for all pathogens tested, including cryptococcus, VZV and HSV.  - Cryptococcal Ag negative.  - IgG index is normal at 0.5.  - OCBs testing has resulted: No oligoclonal bands seen but he does have matched bands in both serum and CSF. - Serum labs:  - Vitamin B12 level consistent with supplementation. Mildly elevated Na. BUN 30. ALT 48. TSH normal. ANA negative.  - C3 complement level elevated at 184. Autoimmune panel otherwise negative.  - HIV screen negative.     Medications: Scheduled:  aspirin  81 mg Per Tube Daily   Chlorhexidine Gluconate Cloth  6 each Topical Daily   docusate  100 mg Per Tube Daily   feeding supplement (PROSource TF20)  60 mL Per Tube BID   free water  400 mL Per Tube Q6H   influenza vac split quadrivalent PF  0.5 mL Intramuscular Tomorrow-1000   insulin aspart  0-15 Units Subcutaneous Q4H   insulin aspart  0-5 Units Subcutaneous QHS   insulin glargine-yfgn  30 Units Subcutaneous BID   mouth rinse  15 mL Mouth Rinse 4 times per day   pantoprazole  40 mg Per Tube BID   pneumococcal 23 valent vaccine  0.5 mL Intramuscular Tomorrow-1000   polyethylene glycol  17 g Per Tube Daily   rosuvastatin  40 mg Per Tube Daily  sodium chloride flush  10-40 mL Intracatheter Q12H   sodium chloride flush  5 mL Intracatheter Q8H   Zinc Oxide   Topical BID   Continuous:  sodium chloride 10 mL/hr at 10/06/21 0700   sodium chloride Stopped (10/03/21 1153)   ceFEPime (MAXIPIME) IV 200 mL/hr at 10/06/21 0700   dextrose Stopped (10/04/21 1008)   feeding supplement (VITAL 1.5 CAL) 60 mL/hr at 10/06/21 0700   lactated ringers     metronidazole Stopped (10/06/21 0016)   vancomycin Stopped (10/06/21 0118)   Assessment: 61 year old patient with history of stroke in April of this year with minimal residual deficits, HTN, HLD, DM and recent jejunal bleed, presented with  continued AMS and concerning multifocal enhancing white matter lesions on MRI that exhibit morphologies and a distribution suggestive of MS or ADEM, with vascular lymphoma also a consideration.  He has no prior history of demyelinating disease and wife states no prior episodes of focal weakness or vision loss. Also with no family history of MS. Spine imaging negative for cord lesions. Has completed a 5 day course of IV Solumedrol as empiric treatment for the lesions without improvement in presentation. Plan for PLEX therapy starting 10/2.   - Inflammation favored over infection given that he has been afebrile and primarily altered but not ill-appearing therefore no indication for empiric CNS coverage at this time.   - DDx for AMS: May be secondary to a CNS inflammatory process as evidenced by the enhancing lesions seen on brain MRI. Inflammation may not be restricted only to the regions of enhancement visible on MRI. Sequelae of end organ damage from his septic shock is also on the DDx. 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   - DDx for white matter lesions on MRI: ADEM, multiple sclerosis and cerebral vasculitis (isolated or as a component of a possible systemic vasculitis) are felt to be highest on the DDx. He did have matched bands on testing for oligoclonal bands. Which suggests peripheral antibody production but could also be related to his recent cholecystitis. CT angio head and neck not concerning for LVO or vasculitis. - Assessment of response to 5 day course of pulsed-dose steroids (completed on Saturday 9/23): fluctuating exam but at most had a gradual improvement over the 5 day period but he still remains significantly altered from his baseline.   Recommendations: - OOB to chair when possible to reduce the risk of developing an ICU neuropathy or myopathy -  PLEX x 5 sessions, HD Cath placed. - Monitor Hgb   Patient seen and examined by NP/APP with MD. MD to update note as needed.   Elmer Picker, DNP, FNP-BC [facility] Pager: [phone]  Attending Neurohospitalist Addendum Patient seen and examined with APP/Resident. Agree with the history and physical as documented above. Agree with the plan as documented, which I helped formulate. I have independently reviewed the chart, obtained history, review of systems and examined the patient.I have personally reviewed pertinent head/neck/spine imaging (CT/MRI).  I am taking over care of Mr. Renne. The outgoing neurology team has discussed plans for, risks and alternatives of PLEX with wife. Plan to initiate 5 rounds of PLEX q other day.  Please feel free to call with any questions.  -- Milon Dikes, MD Neurologist [facility] Pager: [phone]

## 2021-10-06 NOTE — Progress Notes (Signed)
OT Cancellation Note  Patient Details Name: Troy Randall MRN: [id number] DOB: 09/03/1960   Cancelled Treatment:    Reason Eval/Treat Not Completed: Medical issues which prohibited therapy (Pt with rectal bleeding and decreasing hgb.)  [name] 10/06/2021, 2:17 PM Cleta Alberts, OTR/L Acute Rehabilitation Services Office: [phone]

## 2021-10-06 NOTE — Progress Notes (Signed)
Inpatient Diabetes Program Recommendations  AACE/ADA: New Consensus Statement on Inpatient Glycemic Control (2015)  Target Ranges:  Prepandial:   less than 140 mg/dL      Peak postprandial:   less than 180 mg/dL (1-2 hours)      Critically ill patients:  140 - 180 mg/dL   Lab Results  Component Value Date   GLUCAP 249 (H) 10/06/2021   HGBA1C 6.2 (H) 04/29/2021    Review of Glycemic Control  Latest Reference Range & Units 10/05/21 23:15 10/06/21 00:49 10/06/21 03:19 10/06/21 07:30  Glucose-Capillary 70 - 99 mg/dL 245 (H) 218 (H) 188 (H) 249 (H)   Current orders for Inpatient glycemic control:  Semglee 30 units bid, Novolog 0-15 units q 4 hours and HS coverage Vital 60 ml/hr Inpatient Diabetes Program Recommendations:   May consider adding Novolog 3 units q 4 hours to cover CHO in tube feeds (hold if tube feeds held for any reason).  Thanks,  Adah Perl, RN, BC-ADM Inpatient Diabetes Coordinator Pager [phone]  (8a-5p)

## 2021-10-07 ENCOUNTER — Encounter (HOSPITAL_COMMUNITY): Payer: Self-pay | Admitting: Certified Registered Nurse Anesthetist

## 2021-10-07 ENCOUNTER — Encounter (HOSPITAL_COMMUNITY)
Admission: EM | Disposition: A | Discharge status: Rehabilitation Facility | Payer: Self-pay | Source: Home / Self Care | Attending: Family Medicine

## 2021-10-07 DIAGNOSIS — N179 Acute kidney failure, unspecified: Secondary | ICD-10-CM | POA: Diagnosis not present

## 2021-10-07 DIAGNOSIS — J9611 Chronic respiratory failure with hypoxia: Secondary | ICD-10-CM | POA: Diagnosis not present

## 2021-10-07 DIAGNOSIS — L8990 Pressure ulcer of unspecified site, unspecified stage: Secondary | ICD-10-CM | POA: Insufficient documentation

## 2021-10-07 DIAGNOSIS — D62 Acute posthemorrhagic anemia: Secondary | ICD-10-CM | POA: Diagnosis not present

## 2021-10-07 DIAGNOSIS — G9341 Metabolic encephalopathy: Secondary | ICD-10-CM | POA: Diagnosis not present

## 2021-10-07 HISTORY — PX: COLONOSCOPY: SHX5424

## 2021-10-07 HISTORY — PX: BIOPSY: SHX5522

## 2021-10-07 LAB — CBC WITH DIFFERENTIAL/PLATELET
Abs Immature Granulocytes: 0.33 10*3/uL — ABNORMAL HIGH (ref 0.00–0.07)
Basophils Absolute: 0 10*3/uL (ref 0.0–0.1)
Basophils Relative: 0 %
Eosinophils Absolute: 0.1 10*3/uL (ref 0.0–0.5)
Eosinophils Relative: 2 %
HCT: 22.5 % — ABNORMAL LOW (ref 39.0–52.0)
Hemoglobin: 7.2 g/dL — ABNORMAL LOW (ref 13.0–17.0)
Immature Granulocytes: 5 %
Lymphocytes Relative: 11 %
Lymphs Abs: 0.8 10*3/uL (ref 0.7–4.0)
MCH: 28.6 pg (ref 26.0–34.0)
MCHC: 32 g/dL (ref 30.0–36.0)
MCV: 89.3 fL (ref 80.0–100.0)
Monocytes Absolute: 0.4 10*3/uL (ref 0.1–1.0)
Monocytes Relative: 5 %
Neutro Abs: 5.7 10*3/uL (ref 1.7–7.7)
Neutrophils Relative %: 77 %
Platelets: 129 10*3/uL — ABNORMAL LOW (ref 150–400)
RBC: 2.52 MIL/uL — ABNORMAL LOW (ref 4.22–5.81)
RDW: 15.6 % — ABNORMAL HIGH (ref 11.5–15.5)
WBC: 7.4 10*3/uL (ref 4.0–10.5)
nRBC: 0 % (ref 0.0–0.2)

## 2021-10-07 LAB — MAGNESIUM: Magnesium: 1.7 mg/dL (ref 1.7–2.4)

## 2021-10-07 LAB — COMPREHENSIVE METABOLIC PANEL
ALT: 81 U/L — ABNORMAL HIGH (ref 0–44)
AST: 87 U/L — ABNORMAL HIGH (ref 15–41)
Albumin: 3.3 g/dL — ABNORMAL LOW (ref 3.5–5.0)
Alkaline Phosphatase: 145 U/L — ABNORMAL HIGH (ref 38–126)
Anion gap: 6 (ref 5–15)
BUN: 9 mg/dL (ref 6–20)
CO2: 21 mmol/L — ABNORMAL LOW (ref 22–32)
Calcium: 7.8 mg/dL — ABNORMAL LOW (ref 8.9–10.3)
Chloride: 113 mmol/L — ABNORMAL HIGH (ref 98–111)
Creatinine, Ser: 0.5 mg/dL — ABNORMAL LOW (ref 0.61–1.24)
GFR, Estimated: >60 mL/min (ref >60)
Glucose, Bld: 249 mg/dL — ABNORMAL HIGH (ref 70–99)
Potassium: 3.4 mmol/L — ABNORMAL LOW (ref 3.5–5.1)
Sodium: 140 mmol/L (ref 135–145)
Total Bilirubin: 0.6 mg/dL (ref 0.3–1.2)
Total Protein: 4.4 g/dL — ABNORMAL LOW (ref 6.5–8.1)

## 2021-10-07 LAB — GLUCOSE, CAPILLARY
Glucose-Capillary: 221 mg/dL — ABNORMAL HIGH (ref 70–99)
Glucose-Capillary: 133 mg/dL — ABNORMAL HIGH (ref 70–99)
Glucose-Capillary: 110 mg/dL — ABNORMAL HIGH (ref 70–99)
Glucose-Capillary: 116 mg/dL — ABNORMAL HIGH (ref 70–99)
Glucose-Capillary: 191 mg/dL — ABNORMAL HIGH (ref 70–99)
Glucose-Capillary: 260 mg/dL — ABNORMAL HIGH (ref 70–99)
Glucose-Capillary: 280 mg/dL — ABNORMAL HIGH (ref 70–99)

## 2021-10-07 LAB — PHOSPHORUS: Phosphorus: 1.8 mg/dL — ABNORMAL LOW (ref 2.5–4.6)

## 2021-10-07 LAB — HEMOGLOBIN AND HEMATOCRIT, BLOOD
HCT: 22.9 % — ABNORMAL LOW (ref 39.0–52.0)
Hemoglobin: 7.2 g/dL — ABNORMAL LOW (ref 13.0–17.0)

## 2021-10-07 SURGERY — COLONOSCOPY
Anesthesia: Monitor Anesthesia Care

## 2021-10-07 MED ORDER — MAGNESIUM SULFATE 2 GM/50ML IV SOLN
2.0000 g | Freq: Once | INTRAVENOUS | Status: AC
  Administered 2021-10-07: 2 g via INTRAVENOUS
  Filled 2021-10-07: qty 50

## 2021-10-07 MED ORDER — TAMSULOSIN HCL 0.4 MG PO CAPS: 0.4000 mg | ORAL_CAPSULE | Freq: Every day | ORAL | Status: DC

## 2021-10-07 MED ORDER — SODIUM CHLORIDE 0.9 % IV SOLN: INTRAVENOUS | Status: DC

## 2021-10-07 MED ORDER — POTASSIUM PHOSPHATES 15 MMOLE/5ML IV SOLN
30.0000 mmol | Freq: Once | INTRAVENOUS | Status: AC
  Administered 2021-10-07: 30 mmol via INTRAVENOUS
  Filled 2021-10-07: qty 10

## 2021-10-07 NOTE — Progress Notes (Signed)
Neurology Progress Note  Subjective: Seen and examined Appreciate CCM and GI care.  Objective: Current vital signs: BP (!) 116/55   Pulse 73   Temp 98.6 F (37 C) (Oral)   Resp 16   Ht 6' (1.829 m)   Wt 124.7 kg   SpO2 99%   BMI 37.29 kg/m  Vital signs in last 24 hours: Temp:  [97.3 F (36.3 C)-98.6 F (37 C)] 98.6 F (37 C) (10/03 0730) Pulse Rate:  [73-102] 73 (10/03 0740) Resp:  [13-25] 16 (10/03 0740) BP: (114-159)/(51-83) 116/55 (10/03 0740) SpO2:  [97 %-100 %] 99 % (10/03 0740) FiO2 (%):  [21 %] 21 % (10/03 0800) Weight:  [124.2 kg-124.7 kg] 124.7 kg (10/03 0200)  Intake/Output from previous day: 10/02 0701 - 10/03 0700 In: 7217.1 [I.V.:580.8; NG/GT:4220; IV Piggyback:2411.4] Out: 3680 [Urine:2930; Stool:750] Intake/Output this shift: Total I/O In: 20 [I.V.:20] Out: 160 [Urine:160] Nutritional status:  Diet Order             Diet NPO time specified  Diet effective now                  HEENT: [place]/AT. Trach collar is noted with nonbloody secretions Ext: No edema Neurological exam He is awake alert, is able to track the examiner today. He follows commands on the right-was able to give a thumbs up.  Does not show 2 fingers on the right.  Does not give thumbs up on the left. Blinks to threat from both sides Cranial nerves: Pupils appear equal round reactive to light, no restriction of extraocular movements, blinks to threat from both sides, face appears grossly symmetric, Motor examination grimace and withdrawal to noxious stimulation, somewhat stronger on the right in comparison to the left.  Right upper extremity is antigravity-she attempts to shake the examiner's hand. Sensation: As above   Lab Results Reviewed: CBC    Component Value Date/Time   WBC 7.4 10/07/2021 0306   RBC 2.52 (L) 10/07/2021 0306   HGB 7.2 (L) 10/07/2021 0306   HCT 22.5 (L) 10/07/2021 0306   PLT 129 (L) 10/07/2021 0306   MCV 89.3 10/07/2021 0306   MCH 28.6 10/07/2021 0306    MCHC 32.0 10/07/2021 0306   RDW 15.6 (H) 10/07/2021 0306   LYMPHSABS 0.8 10/07/2021 0306   MONOABS 0.4 10/07/2021 0306   EOSABS 0.1 10/07/2021 0306   BASOSABS 0.0 10/07/2021 0306      Latest Ref Rng & Units 10/07/2021    3:06 AM 10/06/2021    3:07 AM 10/05/2021    3:44 AM  BMP  Glucose 70 - 99 mg/dL 793  903  009   BUN 6 - 20 mg/dL 9  11  17    Creatinine 0.61 - 1.24 mg/dL  2.33  0.07   Sodium 135 - 145 mmol/L 140  138  139   Potassium 3.5 - 5.1 mmol/L 3.4  3.6  3.8   Chloride 98 - 111 mmol/L 113  111  107   CO2 22 - 32 mmol/L 21  22  23    Calcium 8.9 - 10.3 mg/dL 7.8  7.7  8.2      - LP was performed by CCM on 9/18. CSF findings: - Glucose 126, RBC count 1-5 (unremarkable), clear, colorless, total protein normal, WBC normal.  - Gram stain: Negative - Fungal and bacterial cultures of CSF negative thus far - Infectious disease Biofire panel negative for all pathogens tested, including cryptococcus, VZV and HSV.  - Cryptococcal Ag negative.  -  IgG index is normal at 0.5.  - OCBs testing has resulted: No oligoclonal bands seen but he does have matched bands in both serum and CSF. - Serum labs:  - Vitamin B12 level consistent with supplementation. Mildly elevated Na. BUN 30. ALT 48. TSH normal. ANA negative.  - C3 complement level elevated at 184. Autoimmune panel otherwise negative.  - HIV screen negative.   Medications: Scheduled:  Chlorhexidine Gluconate Cloth  6 each Topical Daily   feeding supplement (PROSource TF20)  60 mL Per Tube BID   free water  250 mL Per Tube Q6H   influenza vac split quadrivalent PF  0.5 mL Intramuscular Tomorrow-1000   insulin aspart  0-15 Units Subcutaneous Q4H   insulin aspart  0-5 Units Subcutaneous QHS   insulin glargine-yfgn  30 Units Subcutaneous BID   mouth rinse  15 mL Mouth Rinse 4 times per day   pantoprazole  40 mg Per Tube BID   pneumococcal 23 valent vaccine  0.5 mL Intramuscular Tomorrow-1000   rosuvastatin  40 mg Per Tube  Daily   sodium chloride flush  10-40 mL Intracatheter Q12H   sodium chloride flush  5 mL Intracatheter Q8H   Zinc Oxide   Topical BID   Continuous:  sodium chloride Stopped (10/07/21 0222)   sodium chloride 10 mL/hr at 10/07/21 0800   sodium chloride 10 mL/hr at 10/07/21 0800   cefTRIAXone (ROCEPHIN)  IV Stopped (10/06/21 1538)   citrate dextrose     dextrose Stopped (10/04/21 1008)   feeding supplement (VITAL 1.5 CAL) Stopped (10/07/21 0230)   lactated ringers     vancomycin 1,500 mg (10/07/21 0942)   Assessment: 61 year old patient with history of stroke in April of this year with minimal residual deficits and imaging concerning in hindsight of FLAIR/T2 hyperintense lesions-noncontrasted studies unclear whether there was any enhancement, HTN, HLD, DM and recent jejunal bleed, presented with continued AMS and concerning multifocal enhancing white matter lesions on MRI that exhibit morphologies and a distribution suggestive of demyelination such as ADEM (acute disseminated encephalomyelitis) He has no prior history of demyelinating disease but review of noncontrasted MRI shows the FLAIR/T2 hyperintensities as above. Spine imaging negative for cord lesions. Has completed a 5 day course of IV Solumedrol as empiric treatment for the lesions without improvement in presentation. Plan for PLEX therapy starting 10/2.   - Inflammation favored over infection given that he has been afebrile and primarily altered but not ill-appearing therefore no indication for empiric CNS coverage at this time.  - DDx for AMS: May be secondary to a CNS inflammatory process as evidenced by the enhancing lesions seen on brain MRI. Inflammation may not be restricted only to the regions of enhancement visible on MRI. Sequelae of end organ damage from his septic shock is also on the DDx.  - DDx for white matter lesions on MRI: ADEM, multiple sclerosis and cerebral vasculitis (isolated or as a component of a possible  systemic vasculitis) are felt to be highest on the DDx. He did have matched bands on testing for oligoclonal bands. Which suggests peripheral antibody production but could also be related to his recent cholecystitis. CT angio head and neck not concerning for LVO or vasculitis. - Assessment of response to 5 day course of pulsed-dose steroids (completed on Saturday 9/23): fluctuating exam but at most had a gradual improvement over the 5 day period but he still remains significantly altered from his baseline.   Recommendations: - OOB to chair when possible to  reduce the risk of developing an ICU neuropathy or myopathy - PLEX x 5 sessions, HD Cath placed. - Monitor Hgb-appreciate management of GIB per the IM and GI teams After completion of PLEX, I would not have more treatments to offer since the diagnosis is still not extremely clear. He will need rehab and placement with continued outpatient care. At this time - I do not think I have enough to make a poor neuro-prognostication despite over a month in the hospital.   -- Milon Dikes, MD Neurologist [facility] Pager: [phone]  Plan d/w Dr. Sharl Ma

## 2021-10-07 NOTE — Progress Notes (Signed)
Speech Language Pathology Treatment: [name] Speaking valve  Patient Details Name: Troy Randall MRN: [id number] DOB: 08-04-1960 Today's Date: 10/07/2021 Time: 7564-3329 SLP Time Calculation (min) (ACUTE ONLY): 20 min  Assessment / Plan / Recommendation Clinical Impression  Patient seen by SLP for skilled treatment session focused on PMV usage/toleration. Wife was in room during session. Patient was awake and alert, cooperative. His RR remained below 20 and SpO2 remained 97%; no visible changes to indicate patient having increased WOB or SOB. SLP deflated cuff of slightly less than 1cc which patient tolerated without difficulty. PMV was placed for a total of 15 minutes with no visible changes in patient and no changes in RR or SpO2. Despite SLP and spouse attempts, patient did not exhibit any phonation as he has in past sessions. He did appear to mouth a word at some point but he then started exhibiting repetitive jaw open and close as he has in past sessions. SLP removed PMV and no back pressure observed. SLP will continue to follow patient for PMV trials and PO trials when appropriate.   HPI HPI: Pt is a 61 y.o. male with history of stroke in 4/23, HTN, HLD and DM with recent severe jejunal bleed s/p vessel coiling who is being seen for altered mental status and concerning white matter changes on MRI. Patient's wife reports that after his stroke in 4/23 he made a good recovery and was able to perform ADLs. He had some residual dysarthria. Pt [facility] to [facility] on 8/31 with GI bleeding and has remained persistently altered. Intubated 9/3 and trach placed 9/11. He currently has #6 cuffed trach tolerating cuff deflated.      SLP Plan  Continue with current plan of care      Recommendations for follow up therapy are one component of a multi-disciplinary discharge planning process, led by the attending physician.  Recommendations may be updated based on patient status, additional  functional criteria and insurance authorization.    Recommendations         Patient may use Passy-Muir Speech Valve: with SLP only PMSV Supervision: Full MD: Please consider changing trach tube to : Cuffless         Oral Care Recommendations: Oral care QID;Staff/trained caregiver to provide oral care Follow Up Recommendations: SLP at Long-term acute care hospital Assistance recommended at discharge: Frequent or constant Supervision/Assistance SLP Visit Diagnosis: Aphonia (R49.1) Plan: Continue with current plan of care          Troy Baller, MA, CCC-SLP Speech Therapy

## 2021-10-07 NOTE — Progress Notes (Signed)
[facility]  PROGRESS NOTE  Troy Randall YSA:[id number] DOB: [birth year] DOA: 09/04/2021 PCP: [name], MD   Brief HPI:   61 years old male with medical history of hypertension, hyperlipidemia, type 2 diabetes, CVA, presented to the hospital with GI bleed.  Patient underwent IR embolization followed by percutaneous cholecystostomy tube placement due to ongoing altered mental status.  MRI of the brain showed numerous contrast-enhancing bilateral white matter lesions concerning for demyelinating disease.  Neurology was consulted and underwent lumbar puncture on 09/22/2021 with low suspicion for infectious etiology.  Neurology has initiated the patient on high-dose steroids on 09/23/2021.  Patient was then transferred to hospitalist service on 09/24/2021.    Significant events. 8/31 presented to [facility] ED, [name] Admit, Gi Consult 9/1 CTA> no evidence of active GI bleed, 2 U PRBC 9/2 2 U PRBC, colonoscopy > no active bleed found but large clots throughout entire colon. EGD> small hiatal hernia, erosive gastropathy with no stigmata of recent bleeding, and non-bleeding duodenal diverticulum.   Underwent then small bowel enteroscopy which noted intermittent blood pumping in the proximal jejunum but was unable to be reached despite multiple attempts, was injected and clip placed. Repeat CTA repeated, which showed positive jejunal diverticular bleeding. IR> active extrav at [facility] territory, jejunal arcade branch, successful coil embo 9/3 Intubated. CTA> no evidence of active GI bleeding.  New finding of abnormal gallbladder with nondependent air, gallbladder wall air, pericholecystic and right upper quadrant information.  IR placed percutaneous cholecystotomy tube.  9/4 R. IJ central line placed with dialysis triple lumen catheter 9/5 CRRT initiated 9/6 Bedside EGD without any evidence of bleeding. CTA a/p without etiology for bleed 9/8 CRRT discontinued. Required ETT exchange yesterday due to blown  cuff. 9/11 Bronchoscopic guided percutaneous tracheostomy placement.  Right IJ central line removed. 9/15 flexible sigmoidoscopy with removal of endoscopy 9/16 MRI brain w/o contrast amended study secondary to range of motion.  Rapid development numerous round white matter lesions in both cerebral hemispheres.  Prior MRIs. 9/17 MRI brain: Numerous contrast-enhancing bilateral white matter lesions, consistent with active demyelination. 9/18 Lumbar puncture  9/19 MRI cervical spine, thoracic : Limited exam, within limitations there is not definitive evidence of active demyelinization. Within limitations of motion artifact, there may be demyelinating lesions at the C6 and T1 vertebral body levels. -S/p  LP on 9/18. 9/25-clinical exam not improved despite completion of 5-day IV steroids.  Neurology planning to repeat MRI of the brain cervical spine thoracic spine. Possible IR consult for catheter placement for possible Plex therapy if abnormal scans 9/26- scans attempted with ativan 9/29-patient transferred to ICU for respiratory distress due to aspiration pneumonia with sepsis 10/2-patient transferred back to [facility]; started having GI bleed, Dr. Mann/Dr. Benson Norway reconsulted        Subjective   Patient seen and examined, had 1 episode of rectal bleeding last night.  Hemoglobin dropped to 7.2 this morning.  Plan for serial flexible sigmoidoscopy as per gastroenterology.  Dr. Collene Mares following.   Assessment/Plan:    Acute metabolic encephalopathy -MRI brain showed bilateral white matter lesions concerning for active demyelinating disease -S/p IV steroids for 5 days with no significant improvement -Neurology planning for Plex x 5 session, HD catheter placed -After completion of Plex, if no improvement consider palliative care consult  Hemorrhagic shock/GI bleed -Patient has history of GI bleed, likely jejunal, diverticular and ulcer secondary to ischemia -S/p coil embolization of the jejunal branch  of SMA on 9/2, bedside EGD 9/6, endoscopic capsule removal 9/15 -  He started having frank GI bleed today again, GI has been re consulted -Dr. Collene Mares  has seen the patient and plan for flexible sigmoidoscopy today. -Hemoglobin today was 7.2; transfuse for Hb less than 7  Acute hypoxemic respiratory failure s/p tracheostomy 9/11 -Patient had tracheostomy placed on 9/11, followed by PCCM weekly for trach -Developed respiratory distress on 9:29 AM with increased work of breathing in setting of aspiration event -Trach culture grew MRSA and E. coli,  antibiotics switched to vancomycin and ceftriaxone  E. coli cholecystitis s/p percutaneous cholecystostomy drain placement -9/3 percutaneous cholecystostomy drain placement and continues to have adequate drainage -S/p ceftriaxone for 7-day course -Antibiotics have been resumed as above -Will eventually need outpatient surgery/IR follow-up  Transaminitis -Mild elevation of AST, ALT and alk phos -Unclear etiology -Likely antibiotic induced -Follow labs in a.m., if continues to rise consider abdominal ultrasound -GI following  Diabetes mellitus type 2 -Tube feeds have been resumed -CBG has been elevated in 200s -Continue Semglee 30 units subcu twice daily -Moderate sliding scale insulin with NovoLog -Continue checking CBGs every 4 hours  Acute kidney injury -S/p CRRT; discontinued on 9/8  Hypernatremia -Continue free water 400 cc every 6 hours  Hypophosphatemia/hypokalemia -We will replace and follow labs in a.m.  Hypertension -Continue amlodipine -Blood pressure well controlled -We will continue to hold lisinopril  Morbid obesity -BMI 37.49 kg/m    Medications     Chlorhexidine Gluconate Cloth  6 each Topical Daily   feeding supplement (PROSource TF20)  60 mL Per Tube BID   free water  250 mL Per Tube Q6H   influenza vac split quadrivalent PF  0.5 mL Intramuscular Tomorrow-1000   insulin aspart  0-15 Units Subcutaneous Q4H    insulin aspart  0-5 Units Subcutaneous QHS   insulin glargine-yfgn  30 Units Subcutaneous BID   mouth rinse  15 mL Mouth Rinse 4 times per day   pantoprazole  40 mg Per Tube BID   pneumococcal 23 valent vaccine  0.5 mL Intramuscular Tomorrow-1000   rosuvastatin  40 mg Per Tube Daily   sodium chloride flush  10-40 mL Intracatheter Q12H   sodium chloride flush  5 mL Intracatheter Q8H   Zinc Oxide   Topical BID     Data Reviewed:   CBG:  Recent Labs  Lab 10/06/21 1917 10/06/21 2314 10/07/21 0315 10/07/21 0735 10/07/21 1131  GLUCAP 256* 251* 221* 133* 110*    SpO2: 97 % O2 Flow Rate (L/min): 5 L/min FiO2 (%): 21 %    Vitals:   10/07/21 1000 10/07/21 1100 10/07/21 1142 10/07/21 1200  BP: 129/72 129/74 129/74 139/76  Pulse: 89 89 87 86  Resp: 20 20 (!) 21 16  Temp:      TempSrc:      SpO2: 98% 95% 97% 97%  Weight:      Height:          Data Reviewed:  Basic Metabolic Panel: Recent Labs  Lab 10/03/21 0429 10/03/21 0546 10/04/21 0410 10/04/21 0932 10/05/21 0344 10/06/21 0307 10/07/21 0306  NA 140 139  --  136 139 138 140  K 3.9 3.9  --  3.4* 3.8 3.6 3.4*  CL 106  --   --  108 107 111 113*  CO2 27  --   --  '22 23 22 ' 21*  GLUCOSE 147*  --   --  342* 225* 234* 249*  BUN 17  --   --  '18 17 11 9  ' CREATININE 0.77  --   --  0.81 0.63 0.59* 0.50*  CALCIUM 8.2*  --   --  7.4* 8.2* 7.7* 7.8*  MG 1.8  --  2.1  --  2.0 1.9 1.7  PHOS 3.0  --  2.7  --  2.2* 1.9* 1.8*    CBC: Recent Labs  Lab 10/03/21 0429 10/03/21 0546 10/04/21 0410 10/05/21 0344 10/06/21 0141 10/06/21 1230 10/07/21 0306  WBC 15.9*  --  12.8* 9.1 7.7 8.0 7.4  NEUTROABS 14.5*  --  11.5* 7.9* 6.4  --  5.7  HGB 11.3*   < > 9.2* 8.9* 8.3* 7.8* 7.2*  HCT 34.5*   < > 29.5* 27.4* 26.2* 24.9* 22.5*  MCV 88.9  --  91.0 89.0 89.7 89.6 89.3  PLT 183  --  148* 158 147* 160 129*   < > = values in this interval not displayed.    LFT Recent Labs  Lab 10/01/21 0359 10/07/21 0306  AST 24 87*   ALT 31 81*  ALKPHOS 85 145*  BILITOT 0.5 0.6  PROT 5.2* 4.4*  ALBUMIN 1.9* 3.3*     Antibiotics: Anti-infectives (From admission, onward)    Start     Dose/Rate Route Frequency Ordered Stop   10/06/21 1515  cefTRIAXone (ROCEPHIN) 2 g in sodium chloride 0.9 % 100 mL IVPB        2 g 200 mL/hr over 30 Minutes Intravenous Every 24 hours 10/06/21 1420 10/10/21 1514   10/04/21 0945  metroNIDAZOLE (FLAGYL) IVPB 500 mg  Status:  Discontinued        500 mg 100 mL/hr over 60 Minutes Intravenous Every 12 hours 10/04/21 0853 10/06/21 1329   10/03/21 2200  vancomycin (VANCOREADY) IVPB 1500 mg/300 mL        1,500 mg 150 mL/hr over 120 Minutes Intravenous Every 12 hours 10/03/21 0613 10/09/21 2359   10/03/21 0700  vancomycin (VANCOREADY) IVPB 2000 mg/400 mL        2,000 mg 200 mL/hr over 120 Minutes Intravenous  Once 10/03/21 0613 10/03/21 1149   10/03/21 0700  ceFEPIme (MAXIPIME) 2 g in sodium chloride 0.9 % 100 mL IVPB  Status:  Discontinued        2 g 200 mL/hr over 30 Minutes Intravenous Every 8 hours 10/03/21 0613 10/06/21 1329   10/03/21 0645  vancomycin (VANCOREADY) IVPB 2000 mg/400 mL  Status:  Discontinued        2,000 mg 200 mL/hr over 120 Minutes Intravenous  Once 10/03/21 0549 10/03/21 0748   10/03/21 0645  ceFEPIme (MAXIPIME) 2 g in sodium chloride 0.9 % 100 mL IVPB  Status:  Discontinued        2 g 200 mL/hr over 30 Minutes Intravenous  Once 10/03/21 0549 10/03/21 1121   10/03/21 0600  vancomycin (VANCOREADY) IVPB 1500 mg/300 mL  Status:  Discontinued        1,500 mg 150 mL/hr over 120 Minutes Intravenous On call 10/02/21 1334 10/03/21 0549   09/11/21 1100  cefTRIAXone (ROCEPHIN) 2 g in sodium chloride 0.9 % 100 mL IVPB        2 g 200 mL/hr over 30 Minutes Intravenous Every 24 hours 09/11/21 1010 09/13/21 1108   09/08/21 0200  piperacillin-tazobactam (ZOSYN) IVPB 3.375 g  Status:  Discontinued        3.375 g 12.5 mL/hr over 240 Minutes Intravenous Every 8 hours 09/07/21 1856  09/07/21 2114   09/07/21 2200  meropenem (MERREM) 1 g in sodium chloride 0.9 % 100 mL IVPB  Status:  Discontinued  1 g 200 mL/hr over 30 Minutes Intravenous Every 8 hours 09/07/21 2114 09/11/21 1010   09/07/21 1945  piperacillin-tazobactam (ZOSYN) IVPB 3.375 g        3.375 g 100 mL/hr over 30 Minutes Intravenous  Once 09/07/21 1856 09/07/21 2005   09/07/21 1753  cefTRIAXone (ROCEPHIN) injection         Intravenous As needed 09/07/21 1754 09/07/21 1753   09/07/21 1749  sodium chloride 0.9 % with cefTRIAXone (ROCEPHIN) ADS Med       Note to Pharmacy: Eveline Keto E: cabinet override      09/07/21 1749 09/07/21 1935        DVT prophylaxis: SCDs  Code Status: Full code  Family Communication: Discussed with patient wife at bedside   CONSULTS gastroenterology, neurology, PCCM   Objective    Physical Examination:  General-appears in no acute distress Neck-tracheostomy in place Heart-S1-S2, regular, no murmur auscultated Lungs-clear to auscultation bilaterally, no wheezing or crackles auscultated Abdomen-soft, nontender, no organomegaly Extremities-no edema in the lower extremities Neuro-alert, nonverbal, not following commands   Status is: Inpatient:      Pressure Injury 10/06/21 Sacrum Medial Stage 2 -  Partial thickness loss of dermis presenting as a shallow open injury with a red, pink wound bed without slough. (Active)  10/06/21 2000  Location: Sacrum  Location Orientation: Medial  Staging: Stage 2 -  Partial thickness loss of dermis presenting as a shallow open injury with a red, pink wound bed without slough.  Wound Description (Comments):   Present on Admission:         [place]   [facility] If 7PM-7AM, please contact night-coverage at [email or web address], Office  [phone]   10/07/2021, 2:40 PM  LOS: 33 days

## 2021-10-07 NOTE — Op Note (Signed)
[facility] Patient Name: Troy Randall Procedure Date : 10/07/2021 MRN: [id number] Attending MD: Carol Ada , MD Date of Birth: July 11, 1960 CSN: [id number] Age: 61 Admit Type: Inpatient Procedure:                Colonoscopy Indications:               Providers:                Carol Ada, MD, Allayne Gitelman, RN, Fransico Setters Mbumina,                            Technician Referring MD:              Medicines:                 Complications:            No immediate complications. Estimated Blood Loss:     Estimated blood loss was minimal. Procedure:                Pre-Anesthesia Assessment:                           - Prior to the procedure, a History and Physical                            was performed, and patient medications and                            allergies were reviewed. The patient's tolerance of                            previous anesthesia was also reviewed. The risks                            and benefits of the procedure and the sedation                            options and risks were discussed with the patient.                            All questions were answered, and informed consent                            was obtained. Prior Anticoagulants: The patient has                            taken heparin, last dose was 1 day prior to                            procedure. ASA Grade Assessment: IV - A patient                            with severe systemic disease that is a constant                            threat  to life. After reviewing the risks and                            benefits, the patient was deemed in satisfactory                            condition to undergo the procedure.                           - No sedation medications were administered.                           After obtaining informed consent, the colonoscope                            was passed under direct vision. Throughout the                            procedure, the  patient's blood pressure, pulse, and                            oxygen saturations were monitored continuously. The                            [id number]-HQ190L ([id number]) Olympus peds colonscope was                            introduced through the anus and advanced to the the                            ascending colon. After obtaining informed consent,                            the colonoscope was passed under direct vision.                            Throughout the procedure, the patient's blood                            pressure, pulse, and oxygen saturations were                            monitored continuously.The colonoscopy was                            performed without difficulty. The patient tolerated                            the procedure well. The quality of the bowel                            preparation was adequate. The rectum was  photographed. Scope In: Scope Out: 3:25:16 PM Findings:      A single (solitary) 25-30 mm ulcer was found in the sigmoid colon. No       bleeding was present. Stigmata of recent bleeding were present. Biopsies       were taken with a cold forceps for histology.      The images of the ulceration were obtained after the cold biopsies. The       initial images obtained were of poor quality secondary to technical       problems. The large superficial ulceration was noted around 25 cm from       the anal verge and it measured 2.5 - 3.0 cm. There was evidence of       bleeding, but no active bleeding was encountered. There was no evidence       of any bleeding up to the mid ascending colon. Cecal intubation was not       possible secondary to patient discomfort and the poor prep in this       region. Impression:               - A single (solitary) ulcer in the sigmoid colon.                            Biopsied. Recommendation:           - Return patient to hospital ward for ongoing care.                           - Resume  previous diet.                           - Continue present medications.                           - Await pathology results. Procedure Code(s):        --- Professional ---                           [phone], 45, Colonoscopy, flexible; with biopsy,                            single or multiple Diagnosis Code(s):        --- Professional ---                           K63.3, Ulcer of intestine CPT copyright 2019 American [employer] Association. All rights reserved. The codes documented in this report are preliminary and upon coder review may  be revised to meet current compliance requirements. Carol Ada, MD Carol Ada, MD 10/07/2021 3:56:32 PM This report has been signed electronically. Number of Addenda: 0

## 2021-10-08 DIAGNOSIS — D62 Acute posthemorrhagic anemia: Secondary | ICD-10-CM | POA: Diagnosis not present

## 2021-10-08 DIAGNOSIS — K810 Acute cholecystitis: Secondary | ICD-10-CM | POA: Diagnosis not present

## 2021-10-08 DIAGNOSIS — A498 Other bacterial infections of unspecified site: Secondary | ICD-10-CM | POA: Diagnosis not present

## 2021-10-08 DIAGNOSIS — G9341 Metabolic encephalopathy: Secondary | ICD-10-CM | POA: Diagnosis not present

## 2021-10-08 LAB — BASIC METABOLIC PANEL
Anion gap: 5 (ref 5–15)
BUN: 11 mg/dL (ref 6–20)
CO2: 24 mmol/L (ref 22–32)
Calcium: 7.8 mg/dL — ABNORMAL LOW (ref 8.9–10.3)
Chloride: 110 mmol/L (ref 98–111)
Creatinine, Ser: 0.45 mg/dL — ABNORMAL LOW (ref 0.61–1.24)
GFR, Estimated: >60 mL/min (ref >60)
Glucose, Bld: 289 mg/dL — ABNORMAL HIGH (ref 70–99)
Potassium: 3.6 mmol/L (ref 3.5–5.1)
Sodium: 139 mmol/L (ref 135–145)

## 2021-10-08 LAB — CBC WITH DIFFERENTIAL/PLATELET
Abs Immature Granulocytes: 0.45 10*3/uL — ABNORMAL HIGH (ref 0.00–0.07)
Basophils Absolute: 0 10*3/uL (ref 0.0–0.1)
Basophils Relative: 0 %
Eosinophils Absolute: 0.2 10*3/uL (ref 0.0–0.5)
Eosinophils Relative: 2 %
HCT: 24 % — ABNORMAL LOW (ref 39.0–52.0)
Hemoglobin: 7.6 g/dL — ABNORMAL LOW (ref 13.0–17.0)
Immature Granulocytes: 5 %
Lymphocytes Relative: 12 %
Lymphs Abs: 1 10*3/uL (ref 0.7–4.0)
MCH: 28.3 pg (ref 26.0–34.0)
MCHC: 31.7 g/dL (ref 30.0–36.0)
MCV: 89.2 fL (ref 80.0–100.0)
Monocytes Absolute: 0.3 10*3/uL (ref 0.1–1.0)
Monocytes Relative: 4 %
Neutro Abs: 6.4 10*3/uL (ref 1.7–7.7)
Neutrophils Relative %: 77 %
Platelets: 148 10*3/uL — ABNORMAL LOW (ref 150–400)
RBC: 2.69 MIL/uL — ABNORMAL LOW (ref 4.22–5.81)
RDW: 16.2 % — ABNORMAL HIGH (ref 11.5–15.5)
WBC: 8.3 10*3/uL (ref 4.0–10.5)
nRBC: 0 % (ref 0.0–0.2)

## 2021-10-08 LAB — MISC LABCORP TEST (SEND OUT): Labcorp test code: 505310

## 2021-10-08 LAB — COMPREHENSIVE METABOLIC PANEL
ALT: 175 U/L — ABNORMAL HIGH (ref 0–44)
AST: 136 U/L — ABNORMAL HIGH (ref 15–41)
Albumin: 2.9 g/dL — ABNORMAL LOW (ref 3.5–5.0)
Alkaline Phosphatase: 189 U/L — ABNORMAL HIGH (ref 38–126)
Anion gap: 8 (ref 5–15)
BUN: 7 mg/dL (ref 6–20)
CO2: 23 mmol/L (ref 22–32)
Calcium: 7.7 mg/dL — ABNORMAL LOW (ref 8.9–10.3)
Chloride: 106 mmol/L (ref 98–111)
Creatinine, Ser: 0.53 mg/dL — ABNORMAL LOW (ref 0.61–1.24)
GFR, Estimated: >60 mL/min (ref >60)
Glucose, Bld: 326 mg/dL — ABNORMAL HIGH (ref 70–99)
Potassium: 3.9 mmol/L (ref 3.5–5.1)
Sodium: 137 mmol/L (ref 135–145)
Total Bilirubin: 0.7 mg/dL (ref 0.3–1.2)
Total Protein: 4.5 g/dL — ABNORMAL LOW (ref 6.5–8.1)

## 2021-10-08 LAB — CULTURE, BLOOD (ROUTINE X 2)
Culture: NO GROWTH
Culture: NO GROWTH
Special Requests: ADEQUATE
Special Requests: ADEQUATE

## 2021-10-08 LAB — PHOSPHORUS: Phosphorus: 3.1 mg/dL (ref 2.5–4.6)

## 2021-10-08 LAB — GLUCOSE, CAPILLARY
Glucose-Capillary: 260 mg/dL — ABNORMAL HIGH (ref 70–99)
Glucose-Capillary: 301 mg/dL — ABNORMAL HIGH (ref 70–99)
Glucose-Capillary: 285 mg/dL — ABNORMAL HIGH (ref 70–99)
Glucose-Capillary: 253 mg/dL — ABNORMAL HIGH (ref 70–99)
Glucose-Capillary: 232 mg/dL — ABNORMAL HIGH (ref 70–99)
Glucose-Capillary: 273 mg/dL — ABNORMAL HIGH (ref 70–99)
Glucose-Capillary: 258 mg/dL — ABNORMAL HIGH (ref 70–99)

## 2021-10-08 LAB — MAGNESIUM: Magnesium: 1.8 mg/dL (ref 1.7–2.4)

## 2021-10-08 MED ORDER — ACD FORMULA A 0.73-2.45-2.2 GM/100ML VI SOLN
1000.0000 mL | Status: DC
  Administered 2021-10-08: 1000 mL
  Filled 2021-10-08 (×2): qty 1000

## 2021-10-08 MED ORDER — CALCIUM GLUCONATE-NACL 2-0.675 GM/100ML-% IV SOLN
2.0000 g | Freq: Once | INTRAVENOUS | Status: AC
  Administered 2021-10-08: 2000 mg via INTRAVENOUS
  Filled 2021-10-08 (×2): qty 100

## 2021-10-08 MED ORDER — DIPHENHYDRAMINE HCL 25 MG PO CAPS: 25.0000 mg | ORAL_CAPSULE | Freq: Four times a day (QID) | ORAL | Status: DC | PRN

## 2021-10-08 MED ORDER — INSULIN ASPART 100 UNIT/ML IJ SOLN: 0.0000 [IU] | Freq: Three times a day (TID) | INTRAMUSCULAR | Status: DC

## 2021-10-08 MED ORDER — ACETAMINOPHEN 325 MG PO TABS: 650.0000 mg | ORAL_TABLET | ORAL | Status: DC | PRN

## 2021-10-08 MED ORDER — CALCIUM CARBONATE ANTACID 500 MG PO CHEW
2.0000 | CHEWABLE_TABLET | ORAL | Status: AC
  Administered 2021-10-08 (×2): 400 mg via ORAL
  Filled 2021-10-08 (×3): qty 2

## 2021-10-08 MED ORDER — SODIUM CHLORIDE 0.9 % IV SOLN
INTRAVENOUS | Status: AC
  Filled 2021-10-08 (×5): qty 200

## 2021-10-08 MED ORDER — DOXAZOSIN MESYLATE 2 MG PO TABS
2.0000 mg | ORAL_TABLET | Freq: Every day | ORAL | Status: DC
  Administered 2021-10-08 – 2021-10-23 (×15): 2 mg
  Filled 2021-10-08 (×16): qty 1

## 2021-10-08 MED ORDER — HEPARIN SODIUM (PORCINE) 1000 UNIT/ML IJ SOLN
1000.0000 [IU] | Freq: Once | INTRAMUSCULAR | Status: AC
  Administered 2021-10-08: 1000 [IU]
  Filled 2021-10-08: qty 1

## 2021-10-08 MED ORDER — DEXTROSE 10 % IV SOLN: INTRAVENOUS | Status: DC | PRN

## 2021-10-08 MED ORDER — INSULIN ASPART 100 UNIT/ML IJ SOLN
4.0000 [IU] | INTRAMUSCULAR | Status: DC
  Administered 2021-10-08 – 2021-10-09 (×5): 4 [IU] via SUBCUTANEOUS

## 2021-10-08 NOTE — Inpatient Diabetes Management (Signed)
Inpatient Diabetes Program Recommendations  AACE/ADA: New Consensus Statement on Inpatient Glycemic Control (2015)  Target Ranges:  Prepandial:   less than 140 mg/dL      Peak postprandial:   less than 180 mg/dL (1-2 hours)      Critically ill patients:  140 - 180 mg/dL   Lab Results  Component Value Date   GLUCAP 285 (H) 10/08/2021   HGBA1C 6.2 (H) 04/29/2021    Review of Glycemic Control  Latest Reference Range & Units 10/07/21 19:14 10/07/21 22:45 10/07/21 23:11 10/08/21 03:06 10/08/21 07:25 10/08/21 11:13  Glucose-Capillary 70 - 99 mg/dL 191 (H) 260 (H) 280 (H) 260 (H) 301 (H) 285 (H)    Current orders for Inpatient glycemic control:  Semglee 30 units bid, Novolog 0-15 units q 4 hours and HS coverage Vital 60 ml/hr  Inpatient Diabetes Program Recommendations:   May consider adding Novolog 4 units q 4 hours to cover CHO in tube feeds (hold if tube feeds held for any reason).  Thanks,  Adah Perl, RN, BC-ADM Inpatient Diabetes Coordinator Pager [phone]  (8a-5p)

## 2021-10-08 NOTE — Progress Notes (Signed)
SLP Cancellation Note  Patient Details Name: Troy Randall MRN: [id number] DOB: Feb 06, 1960   Cancelled treatment:       Reason Eval/Treat Not Completed: Other (comment) (patient working with PT and OT)  Sonia Baller, MA, CCC-SLP Speech Therapy

## 2021-10-08 NOTE — Plan of Care (Signed)
Patient briefly seen this morning No change in exam from neurological standpoint Plan remains for 5 sessions of plasma exchange as before. I will continue to follow the patient with you.  -- Amie Portland, MD Neurologist [facility] Pager: [phone]

## 2021-10-08 NOTE — Progress Notes (Signed)
Occupational Therapy Treatment Patient Details Name: Troy Randall MRN: [id number] DOB: 09-21-1960 Today's Date: 10/08/2021   History of present illness Patient is a 61 y/o male who presents on 09/04/21 with dark stools, dizziness and SOB. Found to have GI bleed and acute anemia s/p embolization 09/06/21 and percutaneous cholecystostomy 09/07/21. Intubated 9/3, CRRT 9/5-9/8. Hemorrhagic and septic shock 9/5. Trach placed 9/11. 9/16 MRI rapid development numerous round white matter lesions in both cerebral hemispheres; 9/17 MRI: numerous white matter lesions consistent with active demyelination - concerning for MS, ADEM, and cerebral vasculitis; 9/18 Lumbar puncture. Pt returned to ICU on 9/29 with aspiration PNA, tachypnea, tachycardia and increased WOB. PMH includes CVA, DM, HTN.   OT comments  Pt with stable VS throughout session on RA. Focus of session on bed mobility, sitting balance/tolerance at EOB and standing x 3. Pt continues to need significant +2 assist for all mobility. Pt demonstrated improved attention, particularly when seated upright. Nodding head to yes/no questions and following simple commands inconsistently. Pt with nystagmus when returned to supine. Supportive wife participating in session.    Recommendations for follow up therapy are one component of a multi-disciplinary discharge planning process, led by the attending physician.  Recommendations may be updated based on patient status, additional functional criteria and insurance authorization.    Follow Up Recommendations  [facility] at [facility]    Assistance Recommended at Discharge Frequent or constant Supervision/Assistance  Patient can return home with the following  Two people to help with walking and/or transfers;Two people to help with bathing/dressing/bathroom;Direct supervision/assist for medications management;Direct supervision/assist for financial management;Assist for transportation;Help with stairs or  ramp for entrance;Assistance with feeding;Assistance with cooking/housework   Equipment Recommendations  Other (comment) (defer to next venue)    Recommendations for Other Services      Precautions / Restrictions Precautions Precautions: Fall Precaution Comments: trach collar, cortrak, R flank drain Restrictions Weight Bearing Restrictions: No       Mobility Bed Mobility Overal bed mobility: Needs Assistance Bed Mobility: Rolling, Sit to Supine, Sidelying to Sit Rolling: +2 for physical assistance, Total assist Sidelying to sit: +2 for physical assistance, Max assist   Sit to supine: +2 for physical assistance, Max assist   General bed mobility comments: pt assisting minimally with rolling and holding rail with roll to L side, truncal activation noted with side to sit and sit to side, assist for LEs in and out of bed    Transfers Overall transfer level: Needs assistance Equipment used: 2 person hand held assist Transfers: Sit to/from Stand Sit to Stand: +2 physical assistance, Max assist           General transfer comment: with chair in front of pt and hands place on chair, pt stood x 3 with use of bed pad under hips and facilitation of upright posture     Balance Overall balance assessment: Needs assistance   Sitting balance-Leahy Scale: Fair Sitting balance - Comments: good midline control, pt much more alert in sitting     Standing balance-Leahy Scale: Poor                             ADL either performed or assessed with clinical judgement   ADL  General ADL Comments: dependent in ADLs, focused on bed mobility, sitting balance and standing as a precursor    Extremity/Trunk Assessment              Vision       Perception     Praxis      Cognition Arousal/Alertness: Awake/alert Behavior During Therapy: Flat affect Overall Cognitive Status: Impaired/Different from  baseline Area of Impairment: Following commands, Attention, Safety/judgement                   Current Attention Level: Focused   Following Commands: Follows one step commands with increased time, Follows one step commands inconsistently Safety/Judgement: Decreased awareness of safety     General Comments: pt answering yes/no questions reliably, but inconsistently        Exercises      Shoulder Instructions       General Comments      Pertinent Vitals/ Pain       Pain Assessment Pain Assessment: Faces Faces Pain Scale: No hurt  Home Living Family/patient expects to be discharged to:: Private residence                                        Prior Functioning/Environment              Frequency  Min 2X/week        Progress Toward Goals  OT Goals(current goals can now be found in the care plan section)  Progress towards OT goals: Progressing toward goals  Acute Rehab OT Goals OT Goal Formulation: With family Time For Goal Achievement: 10/15/21 Potential to Achieve Goals: Fair ADL Goals Pt/caregiver will Perform Home Exercise Program: Increased strength;Both right and left upper extremity;With minimal assist Additional ADL Goal #1: Pt will follow simple commands within 10 seconds of request with 30% accuracy. Additional ADL Goal #2: Pt will demonstrate fair sitting balance at EOB x 5 minutes as a precursor to ADLs. Additional ADL Goal #3: Pt will roll with moderate assistance for pressure relief and pericare.  Plan Discharge plan remains appropriate    Co-evaluation    PT/OT/SLP Co-Evaluation/Treatment: Yes Reason for Co-Treatment: Complexity of the patient's impairments (multi-system involvement);For patient/therapist safety   OT goals addressed during session: ADL's and self-care      AM-PAC OT "6 Clicks" Daily Activity     Outcome Measure   Help from another person eating meals?: Total Help from another person taking  care of personal grooming?: Total Help from another person toileting, which includes using toliet, bedpan, or urinal?: Total Help from another person bathing (including washing, rinsing, drying)?: Total Help from another person to put on and taking off regular upper body clothing?: Total Help from another person to put on and taking off regular lower body clothing?: Total 6 Click Score: 6    End of Session    OT Visit Diagnosis: Muscle weakness (generalized) (M62.81);Other symptoms and signs involving cognitive function;Low vision, both eyes (H54.2)   Activity Tolerance Patient tolerated treatment well   Patient Left in bed;with call bell/phone within reach;with family/visitor present;with bed alarm set   Nurse Communication          Time: 3846-6599 OT Time Calculation (min): 36 min  Charges: OT General Charges $OT Visit: 1 Visit OT Treatments $Therapeutic Activity: 8-22 mins  Berna Spare, OTR/L Acute Rehabilitation Services Office: [phone]   Evern Bio 10/08/2021, 3:53  PM

## 2021-10-08 NOTE — Progress Notes (Signed)
Physical Therapy Treatment Patient Details Name: Troy Randall MRN: [id number] DOB: 12-01-1960 Today's Date: 10/08/2021   History of Present Illness Patient is a 61 y/o male who presents on 09/04/21 with dark stools, dizziness and SOB. Found to have GI bleed and acute anemia s/p embolization 09/06/21 and percutaneous cholecystostomy 09/07/21. Intubated 9/3, CRRT 9/5-9/8. Hemorrhagic and septic shock 9/5. Trach placed 9/11. 9/16 MRI rapid development numerous round white matter lesions in both cerebral hemispheres; 9/17 MRI: numerous white matter lesions consistent with active demyelination - concerning for MS, ADEM, and cerebral vasculitis; 9/18 Lumbar puncture. Pt returned to ICU on 9/29 with aspiration PNA, tachypnea, tachycardia and increased WOB. PMH includes CVA, DM, HTN.    PT Comments    Pt remains slow to progress, but had a good session today after a week on cancels due to medical complications.  Emphasis today on exercise, rolling/transitions to sitting, sitting balance at EOB for 8+ min, sit to stand x3 with 2 person mod/max assist, attaining full upright, but supported stance 2/3 trials.    Recommendations for follow up therapy are one component of a multi-disciplinary discharge planning process, led by the attending physician.  Recommendations may be updated based on patient status, additional functional criteria and insurance authorization.  Follow Up Recommendations  PT at [facility]     Assistance Recommended at Discharge Frequent or constant Supervision/Assistance  Patient can return home with the following Two people to help with walking and/or transfers;Two people to help with bathing/dressing/bathroom;Assistance with cooking/housework;Assist for transportation;Help with stairs or ramp for entrance   Equipment Recommendations  Other (comment) (TBD)    Recommendations for Other Services       Precautions / Restrictions Precautions Precautions:  Fall Precaution Comments: trach collar, cortrak, R flank drain Restrictions Weight Bearing Restrictions: No     Mobility  Bed Mobility Overal bed mobility: Needs Assistance Bed Mobility: Rolling, Sit to Supine, Sidelying to Sit Rolling: +2 for physical assistance, Total assist Sidelying to sit: +2 for physical assistance, Max assist   Sit to supine: +2 for physical assistance, Max assist   General bed mobility comments: pt assisting minimally with rolling and holding rail with roll to L side, truncal activation noted with side to sit and sit to side, assist for LEs in and out of bed    Transfers Overall transfer level: Needs assistance Equipment used: 2 person hand held assist Transfers: Sit to/from Stand Sit to Stand: +2 physical assistance, Max assist           General transfer comment: with chair in front of pt and hands place on chair, pt stood x 3 with use of bed pad under hips and facilitation of upright posture    Ambulation/Gait                   Stairs             Wheelchair Mobility    Modified Rankin (Stroke Patients Only)       Balance Overall balance assessment: Needs assistance   Sitting balance-Leahy Scale: Fair Sitting balance - Comments: good midline control, pt much more alert in sitting     Standing balance-Leahy Scale: Poor Standing balance comment: pt stood x3 with mod to max assist of 2 persons attaining for upright, but supported stance 2/3 trials.                            Cognition Arousal/Alertness:  Awake/alert Behavior During Therapy: Flat affect Overall Cognitive Status: Impaired/Different from baseline Area of Impairment: Following commands, Attention, Safety/judgement                   Current Attention Level: Focused   Following Commands: Follows one step commands with increased time, Follows one step commands inconsistently Safety/Judgement: Decreased awareness of safety     General  Comments: pt answering yes/no questions reliably, but inconsistently        Exercises Other Exercises Other Exercises: hip/knee flex/ext ROM x10 reps bil with graded assist/resistance.    General Comments General comments (skin integrity, edema, etc.): vss      Pertinent Vitals/Pain Pain Assessment Pain Assessment: Faces Faces Pain Scale: No hurt Pain Intervention(s): Monitored during session    Home Living Family/patient expects to be discharged to:: Private residence                        Prior Function            PT Goals (current goals can now be found in the care plan section) Acute Rehab PT Goals PT Goal Formulation: Patient unable to participate in goal setting Time For Goal Achievement: 10/15/21 Potential to Achieve Goals: Fair Progress towards PT goals: Progressing toward goals    Frequency    Min 2X/week      PT Plan Current plan remains appropriate    Co-evaluation PT/OT/SLP Co-Evaluation/Treatment: Yes Reason for Co-Treatment: Complexity of the patient's impairments (multi-system involvement) PT goals addressed during session: Mobility/safety with mobility OT goals addressed during session: ADL's and self-care      AM-PAC PT "6 Clicks" Mobility   Outcome Measure  Help needed turning from your back to your side while in a flat bed without using bedrails?: Total Help needed moving from lying on your back to sitting on the side of a flat bed without using bedrails?: Total Help needed moving to and from a bed to a chair (including a wheelchair)?: Total Help needed standing up from a chair using your arms (e.g., wheelchair or bedside chair)?: Total Help needed to walk in hospital room?: Total Help needed climbing 3-5 steps with a railing? : Total 6 Click Score: 6    End of Session Equipment Utilized During Treatment: Oxygen Activity Tolerance: Patient tolerated treatment well Patient left: in bed;with call bell/phone within reach;with  family/visitor present;with bed alarm set Nurse Communication: Mobility status PT Visit Diagnosis: Muscle weakness (generalized) (M62.81);Other symptoms and signs involving the nervous system (R29.898)     Time: 5366-4403 PT Time Calculation (min) (ACUTE ONLY): 36 min  Charges:  $Therapeutic Activity: 8-22 mins                     10/08/2021  Troy Carne., PT Acute Rehabilitation Services [phone]  (office)   Troy Randall 10/08/2021, 4:29 PM

## 2021-10-08 NOTE — Progress Notes (Signed)
PROGRESS NOTE    Troy Randall  OFB:[id number] DOB: 05-20-1960 DOA: 09/04/2021 PCP: Benita Stabile, MD   Brief Narrative:  61 years old male with medical history of hypertension, hyperlipidemia, type 2 diabetes, CVA, presented to the hospital with GI bleed.  Patient underwent IR embolization followed by percutaneous cholecystostomy tube placement due to ongoing altered mental status.  MRI of the brain showed numerous contrast-enhancing bilateral white matter lesions concerning for demyelinating disease.  Neurology was consulted and underwent lumbar puncture on 09/22/2021 with low suspicion for infectious etiology.  Neurology has initiated the patient on high-dose steroids on 09/23/2021.  Patient was then transferred to hospitalist service on 09/24/2021.  Significant events. 8/31 presented to [facility] ED, [name] Admit, GI Consult 9/1 CTA> no evidence of active GI bleed, 2 U PRBC 9/2 2 U PRBC, colonoscopy > no active bleed found but large clots throughout entire colon. EGD> small hiatal hernia, erosive gastropathy with no stigmata of recent bleeding, and non-bleeding duodenal diverticulum.   Underwent then small bowel enteroscopy which noted intermittent blood pumping in the proximal jejunum but was unable to be reached despite multiple attempts, was injected and clip placed. Repeat CTA repeated, which showed positive jejunal diverticular bleeding. IR> active extrav at [facility] territory, jejunal arcade branch, successful coil embo 9/3 Intubated. CTA> no evidence of active GI bleeding.  New finding of abnormal gallbladder with nondependent air, gallbladder wall air, pericholecystic and right upper quadrant information.  IR placed percutaneous cholecystotomy tube.  9/4 R. IJ central line placed with dialysis triple lumen catheter 9/5 CRRT initiated 9/6 Bedside EGD without any evidence of bleeding. CTA a/p without etiology for bleed 9/8 CRRT discontinued. Required ETT exchange yesterday due to blown cuff. 9/11  Bronchoscopic guided percutaneous tracheostomy placement.  Right IJ central line removed. 9/15 flexible sigmoidoscopy with removal of endoscopy 9/16 MRI brain w/o contrast amended study secondary to range of motion.  Rapid development numerous round white matter lesions in both cerebral hemispheres.  Prior MRIs. 9/17 MRI brain: Numerous contrast-enhancing bilateral white matter lesions, consistent with active demyelination. 9/18 Lumbar puncture  9/19 MRI cervical spine, thoracic : Limited exam, within limitations there is not definitive evidence of active demyelinization. Within limitations of motion artifact, there may be demyelinating lesions at the C6 and T1 vertebral body levels. 9/25-clinical exam not improved despite completion of 5-day IV steroids.  Neurology planning to repeat MRI of the brain cervical spine thoracic spine. Possible IR consult for catheter placement for possible Plex therapy if abnormal scans 9/26- scans attempted with ativan 9/29-patient transferred to ICU for respiratory distress due to aspiration pneumonia with sepsis 10/2-patient transferred back to [facility]; started having GI bleed, Dr. Mann/Dr. Elnoria Howard reconsulted.  Underwent sigmoidoscopy on 10/07/2021.  Assessment & Plan:   Acute metabolic encephalopathy --MRI brain showed bilateral white matter lesions concerning for active demyelinating disease -S/p IV steroids for 5 days with no significant improvement -Currently undergoing plasma exchange as per neurology: Today is session number 2 out of 5.  Follow further neurology recommendations.  Hemorrhagic shock/GI bleed -Patient has had multiple endoscopic procedures last month including EGD/capsule endoscopy/coil embolization of jejunal branch of SMA. -Possibly from stress ulceration/ischemic disease as per GI: Status post sigmoidoscopy on 10/07/2021 which showed single solitary ulcer in the sigmoid colon which was biopsied. -Has had multiple packed red cell transfusion during  this hospitalization. -Monitor H&H.  Hemoglobin 7.6 today.  Acute respiratory failure with hypoxia status post tracheostomy Aspiration pneumonia -Status post tracheostomy placement on 09/15/2020.  PCCM following  weekly. -Patient had acute aspiration event on 10/03/2021 and was placed on broad-spectrum antibiotics: Trach cultures grew MRSA and E. coli.  Antibiotics switched to Rocephin and vancomycin  E. coli cholecystitis status post percutaneous cholecystostomy drain placement -Had percutaneous cholecystostomy drain placement on 09/07/2021.  Subsequently had completed course of antibiotics -Will eventually need outpatient surgery/IR follow-up  Transaminitis -AST and ALT continue to increase.  Monitor.  Follow GI recommendations.  Thrombocytopenia -Monitor.  Diabetes mellitus type 2 with hyperglycemia -Continue pulmonary insulin along with CBGs with SSI  Acute kidney injury: Resolved -CRRT discontinued on 09/12/2021  Hypokalemia--improved  Hypophosphatemia -Improved  Hypertension -Continue monitoring blood pressure.  Currently on doxazosin   Obesity -Outpatient follow-up  DVT prophylaxis: SCDs Code Status: Full Family Communication: Wife at bedside Disposition Plan: Status is: Inpatient Remains inpatient appropriate because: Of severity of illness    Consultants: GI/neurology/PCCM/general surgery/nephrology/IR  Procedures: As above  Antimicrobials:  Anti-infectives (From admission, onward)    Start     Dose/Rate Route Frequency Ordered Stop   10/06/21 1515  cefTRIAXone (ROCEPHIN) 2 g in sodium chloride 0.9 % 100 mL IVPB        2 g 200 mL/hr over 30 Minutes Intravenous Every 24 hours 10/06/21 1420 10/10/21 1514   10/04/21 0945  metroNIDAZOLE (FLAGYL) IVPB 500 mg  Status:  Discontinued        500 mg 100 mL/hr over 60 Minutes Intravenous Every 12 hours 10/04/21 0853 10/06/21 1329   10/03/21 2200  vancomycin (VANCOREADY) IVPB 1500 mg/300 mL        1,500 mg 150 mL/hr  over 120 Minutes Intravenous Every 12 hours 10/03/21 0613 10/09/21 2359   10/03/21 0700  vancomycin (VANCOREADY) IVPB 2000 mg/400 mL        2,000 mg 200 mL/hr over 120 Minutes Intravenous  Once 10/03/21 0613 10/03/21 1149   10/03/21 0700  ceFEPIme (MAXIPIME) 2 g in sodium chloride 0.9 % 100 mL IVPB  Status:  Discontinued        2 g 200 mL/hr over 30 Minutes Intravenous Every 8 hours 10/03/21 0613 10/06/21 1329   10/03/21 0645  vancomycin (VANCOREADY) IVPB 2000 mg/400 mL  Status:  Discontinued        2,000 mg 200 mL/hr over 120 Minutes Intravenous  Once 10/03/21 0549 10/03/21 0748   10/03/21 0645  ceFEPIme (MAXIPIME) 2 g in sodium chloride 0.9 % 100 mL IVPB  Status:  Discontinued        2 g 200 mL/hr over 30 Minutes Intravenous  Once 10/03/21 0549 10/03/21 1121   10/03/21 0600  vancomycin (VANCOREADY) IVPB 1500 mg/300 mL  Status:  Discontinued        1,500 mg 150 mL/hr over 120 Minutes Intravenous On call 10/02/21 1334 10/03/21 0549   09/11/21 1100  cefTRIAXone (ROCEPHIN) 2 g in sodium chloride 0.9 % 100 mL IVPB        2 g 200 mL/hr over 30 Minutes Intravenous Every 24 hours 09/11/21 1010 09/13/21 1108   09/08/21 0200  piperacillin-tazobactam (ZOSYN) IVPB 3.375 g  Status:  Discontinued        3.375 g 12.5 mL/hr over 240 Minutes Intravenous Every 8 hours 09/07/21 1856 09/07/21 2114   09/07/21 2200  meropenem (MERREM) 1 g in sodium chloride 0.9 % 100 mL IVPB  Status:  Discontinued        1 g 200 mL/hr over 30 Minutes Intravenous Every 8 hours 09/07/21 2114 09/11/21 1010   09/07/21 1945  piperacillin-tazobactam (ZOSYN) IVPB 3.375 g  3.375 g 100 mL/hr over 30 Minutes Intravenous  Once 09/07/21 1856 09/07/21 2005   09/07/21 1753  cefTRIAXone (ROCEPHIN) injection         Intravenous As needed 09/07/21 1754 09/07/21 1753   09/07/21 1749  sodium chloride 0.9 % with cefTRIAXone (ROCEPHIN) ADS Med       Note to Pharmacy: Harlow Asa E: cabinet override      09/07/21 1749 09/07/21 1935         Subjective: Patient seen and examined at bedside.  No overnight fever, vomiting, seizures reported.  Wife present at bedside.  Objective: Vitals:   10/08/21 1100 10/08/21 1118 10/08/21 1200 10/08/21 1240  BP: (!) 143/70  133/67   Pulse: 94  95 92  Resp: (!) 22  18 16   Temp:  (!) 97.3 F (36.3 C)    TempSrc:  Axillary    SpO2: 97%  97% 97%  Weight:      Height:        Intake/Output Summary (Last 24 hours) at 10/08/2021 1356 Last data filed at 10/08/2021 1200 Gross per 24 hour  Intake 2806.87 ml  Output 3080 ml  Net -273.13 ml   Filed Weights   10/06/21 2000 10/07/21 0200 10/08/21 0357  Weight: 124.7 kg 124.7 kg 125.3 kg    Examination:  General exam: Appears calm and comfortable.  Looks chronically ill and deconditioned. ENT: Currently on 5 L oxygen via tracheostomy.   Respiratory system: Bilateral decreased breath sounds at bases with scattered crackles Cardiovascular system: S1 & S2 heard, Rate controlled Gastrointestinal system: Abdomen is distended, soft and nontender. Normal bowel sounds heard. Extremities: No cyanosis, clubbing; trace lower extremity edema Central nervous system: Wakes up slightly, does not follow commands.  No focal neurological deficits.  Skin: No rashes, lesions or ulcers Psychiatry: Could not be assessed because of mental status   Data Reviewed: I have personally reviewed following labs and imaging studies  CBC: Recent Labs  Lab 10/04/21 0410 10/05/21 0344 10/06/21 0141 10/06/21 1230 10/07/21 0306 10/07/21 2317 10/08/21 0413  WBC 12.8* 9.1 7.7 8.0 7.4  --  8.3  NEUTROABS 11.5* 7.9* 6.4  --  5.7  --  6.4  HGB 9.2* 8.9* 8.3* 7.8* 7.2* 7.2* 7.6*  HCT 29.5* 27.4* 26.2* 24.9* 22.5* 22.9* 24.0*  MCV 91.0 89.0 89.7 89.6 89.3  --  89.2  PLT 148* 158 147* 160 129*  --  148*   Basic Metabolic Panel: Recent Labs  Lab 10/04/21 0410 10/04/21 0932 10/05/21 0344 10/06/21 0307 10/07/21 0306 10/08/21 0413  NA  --  136 139 138  140 137  K  --  3.4* 3.8 3.6 3.4* 3.9  CL  --  108 107 111 113* 106  CO2  --  22 23 22  21* 23  GLUCOSE  --  342* 225* 234* 249* 326*  BUN  --  18 17 11 9 7   CREATININE  --  0.81 0.63 0.59* 0.50* 0.53*  CALCIUM  --  7.4* 8.2* 7.7* 7.8* 7.7*  MG 2.1  --  2.0 1.9 1.7 1.8  PHOS 2.7  --  2.2* 1.9* 1.8* 3.1   GFR: Estimated Creatinine Clearance: 136 mL/min (A) (by C-G formula based on SCr of 0.53 mg/dL (L)). Liver Function Tests: Recent Labs  Lab 10/07/21 0306 10/08/21 0413  AST 87* 136*  ALT 81* 175*  ALKPHOS 145* 189*  BILITOT 0.6 0.7  PROT 4.4* 4.5*  ALBUMIN 3.3* 2.9*   Recent Labs  Lab 10/03/21 [phone]  LIPASE 42   No results for input(s): "AMMONIA" in the last 168 hours. Coagulation Profile: Recent Labs  Lab 10/03/21 0816  INR 1.0   Cardiac Enzymes: No results for input(s): "CKTOTAL", "CKMB", "CKMBINDEX", "TROPONINI" in the last 168 hours. BNP (last 3 results) No results for input(s): "PROBNP" in the last 8760 hours. HbA1C: No results for input(s): "HGBA1C" in the last 72 hours. CBG: Recent Labs  Lab 10/07/21 2245 10/07/21 2311 10/08/21 0306 10/08/21 0725 10/08/21 1113  GLUCAP 260* 280* 260* 301* 285*   Lipid Profile: No results for input(s): "CHOL", "HDL", "LDLCALC", "TRIG", "CHOLHDL", "LDLDIRECT" in the last 72 hours. Thyroid Function Tests: No results for input(s): "TSH", "T4TOTAL", "FREET4", "T3FREE", "THYROIDAB" in the last 72 hours. Anemia Panel: No results for input(s): "VITAMINB12", "FOLATE", "FERRITIN", "TIBC", "IRON", "RETICCTPCT" in the last 72 hours. Sepsis Labs: Recent Labs  Lab 10/03/21 0816 10/03/21 1511  LATICACIDVEN 2.1* 1.9    Recent Results (from the past 240 hour(s))  Culture, blood (x 2)     Status: None   Collection Time: 10/03/21  8:14 AM   Specimen: BLOOD LEFT HAND  Result Value Ref Range Status   Specimen Description BLOOD LEFT HAND IN PEDIATRIC BOTTLE  Final   Special Requests   Final    BOTTLES DRAWN AEROBIC ONLY Blood  Culture adequate volume   Culture   Final    NO GROWTH 5 DAYS Performed at [facility], [address], [place], [place] [address]    Report Status 10/08/2021 FINAL  Final  Culture, blood (x 2)     Status: None   Collection Time: 10/03/21  8:16 AM   Specimen: BLOOD  Result Value Ref Range Status   Specimen Description BLOOD BLOOD RIGHT ARM  Final   Special Requests   Final    BOTTLES DRAWN AEROBIC AND ANAEROBIC Blood Culture adequate volume   Culture   Final    NO GROWTH 5 DAYS Performed at [facility], [address], [place], [place] [address]    Report Status 10/08/2021 FINAL  Final  Culture, Respiratory w Gram Stain     Status: None   Collection Time: 10/03/21  6:15 PM   Specimen: Tracheal Aspirate; Respiratory  Result Value Ref Range Status   Specimen Description TRACHEAL ASPIRATE  Final   Special Requests NONE  Final   Gram Stain   Final    ABUNDANT WBC PRESENT, PREDOMINANTLY PMN FEW GRAM NEGATIVE RODS FEW GRAM POSITIVE COCCI IN PAIRS Performed at [facility], [address], [place], [place] [address]    Culture   Final    MODERATE METHICILLIN RESISTANT STAPHYLOCOCCUS AUREUS RARE ESCHERICHIA COLI    Report Status 10/06/2021 FINAL  Final   Organism ID, Bacteria ESCHERICHIA COLI  Final   Organism ID, Bacteria METHICILLIN RESISTANT STAPHYLOCOCCUS AUREUS  Final      Susceptibility   Escherichia coli - MIC*    AMPICILLIN 16 INTERMEDIATE Intermediate     CEFAZOLIN <=4 SENSITIVE Sensitive     CEFEPIME <=0.12 SENSITIVE Sensitive     CEFTAZIDIME <=1 SENSITIVE Sensitive     CEFTRIAXONE <=0.25 SENSITIVE Sensitive     CIPROFLOXACIN <=0.25 SENSITIVE Sensitive     GENTAMICIN <=1 SENSITIVE Sensitive     IMIPENEM <=0.25 SENSITIVE Sensitive     TRIMETH/SULFA <=20 SENSITIVE Sensitive     AMPICILLIN/SULBACTAM 4 SENSITIVE Sensitive     PIP/TAZO <=4 SENSITIVE Sensitive     * RARE ESCHERICHIA COLI   Methicillin resistant staphylococcus  aureus - MIC*     CIPROFLOXACIN >=8 RESISTANT Resistant     ERYTHROMYCIN <=0.25 SENSITIVE Sensitive     GENTAMICIN <=0.5 SENSITIVE Sensitive     OXACILLIN >=4 RESISTANT Resistant     TETRACYCLINE <=1 SENSITIVE Sensitive     VANCOMYCIN 1 SENSITIVE Sensitive     TRIMETH/SULFA >=320 RESISTANT Resistant     CLINDAMYCIN <=0.25 SENSITIVE Sensitive     RIFAMPIN <=0.5 SENSITIVE Sensitive     Inducible Clindamycin NEGATIVE Sensitive     * MODERATE METHICILLIN RESISTANT STAPHYLOCOCCUS AUREUS         Radiology Studies: No results found.      Scheduled Meds:  Chlorhexidine Gluconate Cloth  6 each Topical Daily   doxazosin  2 mg Per Tube Daily   feeding supplement (PROSource TF20)  60 mL Per Tube BID   free water  250 mL Per Tube Q6H   influenza vac split quadrivalent PF  0.5 mL Intramuscular Tomorrow-1000   insulin aspart  0-15 Units Subcutaneous Q4H   insulin aspart  0-15 Units Subcutaneous TID WC   insulin aspart  0-5 Units Subcutaneous QHS   insulin glargine-yfgn  30 Units Subcutaneous BID   mouth rinse  15 mL Mouth Rinse 4 times per day   pantoprazole  40 mg Per Tube BID   pneumococcal 23 valent vaccine  0.5 mL Intramuscular Tomorrow-1000   rosuvastatin  40 mg Per Tube Daily   sodium chloride flush  10-40 mL Intracatheter Q12H   sodium chloride flush  5 mL Intracatheter Q8H   Zinc Oxide   Topical BID   Continuous Infusions:  sodium chloride Stopped (10/08/21 1141)   sodium chloride 10 mL/hr at 10/07/21 1200   sodium chloride Stopped (10/07/21 1454)   cefTRIAXone (ROCEPHIN)  IV Stopped (10/07/21 1450)   citrate dextrose     dextrose Stopped (10/04/21 1008)   feeding supplement (VITAL 1.5 CAL) 60 mL/hr at 10/08/21 1200   lactated ringers     vancomycin Stopped (10/08/21 1136)          Glade Lloyd, MD [facility] 10/08/2021, 1:56 PM

## 2021-10-09 DIAGNOSIS — D62 Acute posthemorrhagic anemia: Secondary | ICD-10-CM | POA: Diagnosis not present

## 2021-10-09 DIAGNOSIS — N179 Acute kidney failure, unspecified: Secondary | ICD-10-CM | POA: Diagnosis not present

## 2021-10-09 DIAGNOSIS — G9341 Metabolic encephalopathy: Secondary | ICD-10-CM | POA: Diagnosis not present

## 2021-10-09 DIAGNOSIS — K810 Acute cholecystitis: Secondary | ICD-10-CM | POA: Diagnosis not present

## 2021-10-09 LAB — COMPREHENSIVE METABOLIC PANEL
ALT: 81 U/L — ABNORMAL HIGH (ref 0–44)
AST: 57 U/L — ABNORMAL HIGH (ref 15–41)
Albumin: 3.7 g/dL (ref 3.5–5.0)
Alkaline Phosphatase: 69 U/L (ref 38–126)
Anion gap: 4 — ABNORMAL LOW (ref 5–15)
BUN: 10 mg/dL (ref 6–20)
CO2: 22 mmol/L (ref 22–32)
Calcium: 7.9 mg/dL — ABNORMAL LOW (ref 8.9–10.3)
Chloride: 113 mmol/L — ABNORMAL HIGH (ref 98–111)
Creatinine, Ser: 0.5 mg/dL — ABNORMAL LOW (ref 0.61–1.24)
GFR, Estimated: >60 mL/min (ref >60)
Glucose, Bld: 302 mg/dL — ABNORMAL HIGH (ref 70–99)
Potassium: 3.6 mmol/L (ref 3.5–5.1)
Sodium: 139 mmol/L (ref 135–145)
Total Bilirubin: 0.8 mg/dL (ref 0.3–1.2)
Total Protein: 4.7 g/dL — ABNORMAL LOW (ref 6.5–8.1)

## 2021-10-09 LAB — CBC WITH DIFFERENTIAL/PLATELET
Abs Immature Granulocytes: 0 10*3/uL (ref 0.00–0.07)
Basophils Absolute: 0 10*3/uL (ref 0.0–0.1)
Basophils Relative: 0 %
Eosinophils Absolute: 0.2 10*3/uL (ref 0.0–0.5)
Eosinophils Relative: 2 %
HCT: 22.6 % — ABNORMAL LOW (ref 39.0–52.0)
Hemoglobin: 7.2 g/dL — ABNORMAL LOW (ref 13.0–17.0)
Lymphocytes Relative: 7 %
Lymphs Abs: 0.6 10*3/uL — ABNORMAL LOW (ref 0.7–4.0)
MCH: 28.9 pg (ref 26.0–34.0)
MCHC: 31.9 g/dL (ref 30.0–36.0)
MCV: 90.8 fL (ref 80.0–100.0)
Monocytes Absolute: 0.4 10*3/uL (ref 0.1–1.0)
Monocytes Relative: 5 %
Neutro Abs: 7 10*3/uL (ref 1.7–7.7)
Neutrophils Relative %: 86 %
Platelets: 131 10*3/uL — ABNORMAL LOW (ref 150–400)
RBC: 2.49 MIL/uL — ABNORMAL LOW (ref 4.22–5.81)
RDW: 16.9 % — ABNORMAL HIGH (ref 11.5–15.5)
WBC: 8.1 10*3/uL (ref 4.0–10.5)
nRBC: 0 % (ref 0.0–0.2)
nRBC: 0 /100 WBC

## 2021-10-09 LAB — PHOSPHORUS: Phosphorus: 1.9 mg/dL — ABNORMAL LOW (ref 2.5–4.6)

## 2021-10-09 LAB — MAGNESIUM: Magnesium: 1.9 mg/dL (ref 1.7–2.4)

## 2021-10-09 LAB — GLUCOSE, CAPILLARY
Glucose-Capillary: 279 mg/dL — ABNORMAL HIGH (ref 70–99)
Glucose-Capillary: 291 mg/dL — ABNORMAL HIGH (ref 70–99)
Glucose-Capillary: 285 mg/dL — ABNORMAL HIGH (ref 70–99)
Glucose-Capillary: 182 mg/dL — ABNORMAL HIGH (ref 70–99)
Glucose-Capillary: 209 mg/dL — ABNORMAL HIGH (ref 70–99)
Glucose-Capillary: 230 mg/dL — ABNORMAL HIGH (ref 70–99)

## 2021-10-09 LAB — SURGICAL PATHOLOGY

## 2021-10-09 MED ORDER — POTASSIUM PHOSPHATES 15 MMOLE/5ML IV SOLN
20.0000 mmol | Freq: Once | INTRAVENOUS | Status: AC
  Administered 2021-10-09: 20 mmol via INTRAVENOUS
  Filled 2021-10-09: qty 6.67

## 2021-10-09 MED ORDER — INSULIN GLARGINE-YFGN 100 UNIT/ML ~~LOC~~ SOLN
35.0000 [IU] | Freq: Two times a day (BID) | SUBCUTANEOUS | Status: DC
  Administered 2021-10-09 – 2021-10-12 (×7): 35 [IU] via SUBCUTANEOUS
  Filled 2021-10-09 (×9): qty 0.35

## 2021-10-09 MED ORDER — INSULIN ASPART 100 UNIT/ML IJ SOLN
8.0000 [IU] | INTRAMUSCULAR | Status: DC
  Administered 2021-10-09 – 2021-10-10 (×6): 8 [IU] via SUBCUTANEOUS

## 2021-10-09 NOTE — Progress Notes (Signed)
Speech Language Pathology Treatment: [name] Speaking valve  Patient Details Name: Troy Randall MRN: [id number] DOB: February 11, 1960 Today's Date: 10/09/2021 Time: 2355-7322 SLP Time Calculation (min) (ACUTE ONLY): 16 min  Assessment / Plan / Recommendation Clinical Impression  Pt seen for session utilizing speaking valve with wife present. Limitations with valve continue due to his cognitive impairments. He is highly distractible, internally and externally, and responded to one question throughout session with head shake in response to pain. He tolerated valve well with vitals within normal range and did not show signs of air trapping. He was visibly uncomfortable/pain without verbal responses given additional time. Strategies to promote verbal communication were not effective this session. Continued education with wife re: limitations and goals of therapy who verbalized understanding. Continue ST.    HPI HPI: Pt is a 61 y.o. male with history of stroke in 4/23, HTN, HLD and DM with recent severe jejunal bleed s/p vessel coiling who is being seen for altered mental status and concerning white matter changes on MRI. Patient's wife reports that after his stroke in 4/23 he made a good recovery and was able to perform ADLs. He had some residual dysarthria. Pt presented to [facility] on 8/31 with GI bleeding and has remained persistently altered. Intubated 9/3 and trach placed 9/11. He currently has #6 cuffed trach tolerating cuff deflated.      SLP Plan  Continue with current plan of care      Recommendations for follow up therapy are one component of a multi-disciplinary discharge planning process, led by the attending physician.  Recommendations may be updated based on patient status, additional functional criteria and insurance authorization.    Recommendations         Patient may use Passy-Muir Speech Valve: During all therapies with supervision PMSV Supervision: Full MD: Please  consider changing trach tube to : Cuffless         Oral Care Recommendations: Oral care QID Follow Up Recommendations: SLP at Long-term acute care hospital Assistance recommended at discharge: Frequent or constant Supervision/Assistance SLP Visit Diagnosis: Aphonia (R49.1) Plan: Continue with current plan of care           [name]  10/09/2021, 9:54 AM

## 2021-10-09 NOTE — Progress Notes (Signed)
TC Jen/[facility].  She is still waiting for pt to complete plasma exchange prior to submitting for auth.  Pt still meets criteria for LTAC at this time. Lurline Idol, MSW, LCSW 10/5/20239:48 AM

## 2021-10-09 NOTE — Plan of Care (Signed)
Status post 2 rounds of plasma exchange. Third round scheduled for Friday, 10/10/2021. Fourth and fifth rounds on Monday, 10/13/2021 and Wednesday, 10/15/2021. Exam unchanged. We will continue to follow.  -- Amie Portland, MD Neurologist [facility] Pager: [phone] \

## 2021-10-09 NOTE — Progress Notes (Signed)
Physical Therapy Treatment Patient Details Name: Troy Randall MRN: [id number] DOB: April 15, 1960 Today's Date: 10/09/2021   History of Present Illness Patient is a 61 y/o male who presents on 09/04/21 with dark stools, dizziness and SOB. Found to have GI bleed and acute anemia s/p embolization 09/06/21 and percutaneous cholecystostomy 09/07/21. Intubated 9/3, CRRT 9/5-9/8. Hemorrhagic and septic shock 9/5. Trach placed 9/11. 9/16 MRI rapid development numerous round white matter lesions in both cerebral hemispheres; 9/17 MRI: numerous white matter lesions consistent with active demyelination - concerning for MS, ADEM, and cerebral vasculitis; 9/18 Lumbar puncture. Pt returned to ICU on 9/29 with aspiration PNA, tachypnea, tachycardia and increased WOB. PMH includes CVA, DM, HTN.    PT Comments    Pt's best day by far. Notably improved time of focus, ability to follow simple commands with delay and cues ~50% of time.  Pt more responsive to greetings and complements.  Emphasis on transitions, scooting, sitting balance, sit to stands with full upright stance and for ~45 secs.    Recommendations for follow up therapy are one component of a multi-disciplinary discharge planning process, led by the attending physician.  Recommendations may be updated based on patient status, additional functional criteria and insurance authorization.  Follow Up Recommendations  PT at Long-term acute care hospital     Assistance Recommended at Discharge Frequent or constant Supervision/Assistance  Patient can return home with the following Two people to help with walking and/or transfers;Two people to help with bathing/dressing/bathroom;Assistance with cooking/housework;Assist for transportation;Help with stairs or ramp for entrance   Equipment Recommendations       Recommendations for Other Services       Precautions / Restrictions Precautions Precautions: Fall Precaution Comments: trach collar, cortrak, R flank  drain     Mobility  Bed Mobility Overal bed mobility: Needs Assistance Bed Mobility: Rolling, Supine to Sit, Sit to Supine Rolling: Mod assist, +2 for physical assistance Sidelying to sit: Mod assist, +2 for physical assistance, Max assist   Sit to supine: +2 for physical assistance, Max assist, Mod assist   General bed mobility comments: pt followed instruction much better given delay and repetitive cuing.  Pt initiated and followed through scooting with minimal assist and cues    Transfers Overall transfer level: Needs assistance   Transfers: Sit to/from Stand Sit to Stand: Mod assist, Max assist, +2 physical assistance           General transfer comment: with chair in front of pt and hands place on chair, pt stood x 3 with use of bed pad under hips and facilitation of upright posture all three trials    Ambulation/Gait                   Stairs             Wheelchair Mobility    Modified Rankin (Stroke Patients Only)       Balance Overall balance assessment: Needs assistance   Sitting balance-Leahy Scale: Fair Sitting balance - Comments: good midline control, pt much more alert in sitting.  showed improved trunk control and ability to follow direction with scooting     Standing balance-Leahy Scale: Poor Standing balance comment: pt stood x3 with mod to max assist of 2 persons attaining for upright, but supported stance 2/3 trials.                            Cognition Arousal/Alertness: Awake/alert Behavior During Therapy:  Flat affect Overall Cognitive Status: Impaired/Different from baseline                                 General Comments: best day cognitively, 6-8 sec delay.        Exercises Other Exercises Other Exercises: hip/knee flex/ext ROM x10 reps bil with graded assist/resistance.    General Comments General comments (skin integrity, edema, etc.): vss      Pertinent Vitals/Pain Pain  Assessment Pain Assessment: Faces Faces Pain Scale: No hurt Pain Intervention(s): Monitored during session    Home Living                          Prior Function            PT Goals (current goals can now be found in the care plan section) Acute Rehab PT Goals Patient Stated Goal: unable to state PT Goal Formulation: Patient unable to participate in goal setting Time For Goal Achievement: 10/15/21 Potential to Achieve Goals: Fair Progress towards PT goals: Progressing toward goals    Frequency    Min 2X/week      PT Plan Current plan remains appropriate    Co-evaluation              AM-PAC PT "6 Clicks" Mobility   Outcome Measure  Help needed turning from your back to your side while in a flat bed without using bedrails?: A Lot Help needed moving from lying on your back to sitting on the side of a flat bed without using bedrails?: A Lot Help needed moving to and from a bed to a chair (including a wheelchair)?: Total Help needed standing up from a chair using your arms (e.g., wheelchair or bedside chair)?: Total Help needed to walk in hospital room?: Total Help needed climbing 3-5 steps with a railing? : Total 6 Click Score: 8    End of Session Equipment Utilized During Treatment: Oxygen Activity Tolerance: Patient tolerated treatment well Patient left: in bed;with call bell/phone within reach;with family/visitor present;with bed alarm set Nurse Communication: Mobility status PT Visit Diagnosis: Muscle weakness (generalized) (M62.81);Other symptoms and signs involving the nervous system (R29.898)     Time: 4081-4481 PT Time Calculation (min) (ACUTE ONLY): 31 min  Charges:  $Therapeutic Activity: 8-22 mins $Neuromuscular Re-education: 8-22 mins                     10/09/2021  Troy Randall., PT Acute Rehabilitation Services [phone]  (office)   Troy Randall Troy Randall 10/09/2021, 5:19 PM

## 2021-10-09 NOTE — Progress Notes (Signed)
Inpatient Diabetes Program Recommendations  AACE/ADA: New Consensus Statement on Inpatient Glycemic Control (2015)  Target Ranges:  Prepandial:   less than 140 mg/dL      Peak postprandial:   less than 180 mg/dL (1-2 hours)      Critically ill patients:  140 - 180 mg/dL   Lab Results  Component Value Date   GLUCAP 291 (H) 10/09/2021   HGBA1C 6.2 (H) 04/29/2021    Review of Glycemic Control  Latest Reference Range & Units 10/08/21 15:28 10/08/21 19:04 10/08/21 22:15 10/08/21 23:14 10/09/21 03:11 10/09/21 07:28  Glucose-Capillary 70 - 99 mg/dL 253 (H) 232 (H) 273 (H) 258 (H) 279 (H) 291 (H)   Current orders for Inpatient glycemic control:  Semglee 35 units bid, Novolog 0-15 units q 4 hours and HS coverage Vital 60 ml/hr, Novolog 4 units q 4 hours   Inpatient Diabetes Program Recommendations:   May consider increasing Novolog tube feed coverage to 8 units q 4 hours to cover CHO in tube feeds (hold if tube feeds held for any reason).  Thanks,  Adah Perl, RN, BC-ADM Inpatient Diabetes Coordinator Pager [phone]  (8a-5p)

## 2021-10-09 NOTE — Progress Notes (Signed)
Subjective: No acute events.  Objective: Vital signs in last 24 hours: Temp:  [97.3 F (36.3 C)-98.8 F (37.1 C)] 98.7 F (37.1 C) (10/05 1128) Pulse Rate:  [78-103] 92 (10/05 1200) Resp:  [14-26] 17 (10/05 1200) BP: (102-140)/(62-76) 137/66 (10/05 1000) SpO2:  [93 %-100 %] 97 % (10/05 1200) FiO2 (%):  [21 %] 21 % (10/05 1200) Weight:  [126.5 kg-129.8 kg] 126.5 kg (10/05 0348) Last BM Date : 10/07/21  Intake/Output from previous day: 10/04 0701 - 10/05 0700 In: 3550.1 [I.V.:224.7; NG/GT:1650; IV Piggyback:1675.3] Out: 2157 [Urine:2157] Intake/Output this shift: Total I/O In: 139.9 [I.V.:19.9; NG/GT:120] Out: -   General appearance: some response to touch GI: obese, soft.  Lab Results: Recent Labs    10/07/21 0306 10/07/21 2317 10/08/21 0413 10/09/21 0334  WBC 7.4  --  8.3 8.1  HGB 7.2* 7.2* 7.6* 7.2*  HCT 22.5* 22.9* 24.0* 22.6*  PLT 129*  --  148* 131*   BMET Recent Labs    10/08/21 0413 10/08/21 1610 10/09/21 0334  NA 137 139 139  K 3.9 3.6 3.6  CL 106 110 113*  CO2 23 24 22   GLUCOSE 326* 289* 302*  BUN 7 11 10   CREATININE 0.53* 0.45* 0.50*  CALCIUM 7.7* 7.8* 7.9*   LFT Recent Labs    10/09/21 0334  PROT 4.7*  ALBUMIN 3.7  AST 57*  ALT 81*  ALKPHOS 69  BILITOT 0.8   PT/INR No results for input(s): "LABPROT", "INR" in the last 72 hours. Hepatitis Panel No results for input(s): "HEPBSAG", "HCVAB", "HEPAIGM", "HEPBIGM" in the last 72 hours. C-Diff No results for input(s): "CDIFFTOX" in the last 72 hours. Fecal Lactopherrin No results for input(s): "FECLLACTOFRN" in the last 72 hours.  Studies/Results: No results found.  Medications: Scheduled:  Chlorhexidine Gluconate Cloth  6 each Topical Daily   doxazosin  2 mg Per Tube Daily   feeding supplement (PROSource TF20)  60 mL Per Tube BID   free water  250 mL Per Tube Q6H   influenza vac split quadrivalent PF  0.5 mL Intramuscular Tomorrow-1000   insulin aspart  0-15 Units Subcutaneous  Q4H   insulin aspart  8 Units Subcutaneous Q4H   insulin glargine-yfgn  35 Units Subcutaneous BID   mouth rinse  15 mL Mouth Rinse 4 times per day   pantoprazole  40 mg Per Tube BID   pneumococcal 23 valent vaccine  0.5 mL Intramuscular Tomorrow-1000   rosuvastatin  40 mg Per Tube Daily   sodium chloride flush  10-40 mL Intracatheter Q12H   sodium chloride flush  5 mL Intracatheter Q8H   Zinc Oxide   Topical BID   Continuous:  sodium chloride Stopped (10/09/21 0900)   sodium chloride 10 mL/hr at 10/07/21 1200   sodium chloride Stopped (10/07/21 1454)   cefTRIAXone (ROCEPHIN)  IV Stopped (10/08/21 1519)   citrate dextrose     dextrose Stopped (10/04/21 1008)   dextrose     feeding supplement (VITAL 1.5 CAL) 60 mL/hr at 10/09/21 1000   lactated ringers     vancomycin 1,500 mg (10/09/21 1020)    Assessment/Plan: 1) Sigmoid colon ulcer. 2) Anemia. 3) Multiple medical problems.   The ulcer biopsies show a moderately active chronic colitis with ulceration.  The etiology is unknown.  His HGB is relatively stable.  It is not clear if any treatment needs to be applied to this finding.    Plan: 1) Monitor HGB. 2) Transfuse as necessary. 3) Minimize anticoagulation.  LOS: 35  days   Colette Dicamillo D 10/09/2021, 12:36 PM

## 2021-10-09 NOTE — Progress Notes (Signed)
PROGRESS NOTE    Troy Randall  HYQ:[id number] DOB: 1960/11/05 DOA: 09/04/2021 PCP: Benita Stabile, MD   Brief Narrative:  61 years old male with medical history of hypertension, hyperlipidemia, type 2 diabetes, CVA, presented to the hospital with GI bleed.  Patient underwent IR embolization followed by percutaneous cholecystostomy tube placement due to ongoing altered mental status.  MRI of the brain showed numerous contrast-enhancing bilateral white matter lesions concerning for demyelinating disease.  Neurology was consulted and underwent lumbar puncture on 09/22/2021 with low suspicion for infectious etiology.  Neurology has initiated the patient on high-dose steroids on 09/23/2021.  Patient was then transferred to hospitalist service on 09/24/2021.  Significant events. 8/31 presented to [facility] ED, [name] Admit, GI Consult 9/1 CTA> no evidence of active GI bleed, 2 U PRBC 9/2 2 U PRBC, colonoscopy > no active bleed found but large clots throughout entire colon. EGD> small hiatal hernia, erosive gastropathy with no stigmata of recent bleeding, and non-bleeding duodenal diverticulum.   Underwent then small bowel enteroscopy which noted intermittent blood pumping in the proximal jejunum but was unable to be reached despite multiple attempts, was injected and clip placed. Repeat CTA repeated, which showed positive jejunal diverticular bleeding. IR> active extrav at [facility] territory, jejunal arcade branch, successful coil embo 9/3 Intubated. CTA> no evidence of active GI bleeding.  New finding of abnormal gallbladder with nondependent air, gallbladder wall air, pericholecystic and right upper quadrant information.  IR placed percutaneous cholecystotomy tube.  9/4 R. IJ central line placed with dialysis triple lumen catheter 9/5 CRRT initiated 9/6 Bedside EGD without any evidence of bleeding. CTA a/p without etiology for bleed 9/8 CRRT discontinued. Required ETT exchange yesterday due to blown cuff. 9/11  Bronchoscopic guided percutaneous tracheostomy placement.  Right IJ central line removed. 9/15 flexible sigmoidoscopy with removal of endoscopy 9/16 MRI brain w/o contrast amended study secondary to range of motion.  Rapid development numerous round white matter lesions in both cerebral hemispheres.  Prior MRIs. 9/17 MRI brain: Numerous contrast-enhancing bilateral white matter lesions, consistent with active demyelination. 9/18 Lumbar puncture  9/19 MRI cervical spine, thoracic : Limited exam, within limitations there is not definitive evidence of active demyelinization. Within limitations of motion artifact, there may be demyelinating lesions at the C6 and T1 vertebral body levels. 9/25-clinical exam not improved despite completion of 5-day IV steroids.  Neurology planning to repeat MRI of the brain cervical spine thoracic spine. Possible IR consult for catheter placement for possible Plex therapy if abnormal scans 9/26- scans attempted with ativan 9/29-patient transferred to ICU for respiratory distress due to aspiration pneumonia with sepsis 10/2-patient transferred back to [facility]; started having GI bleed, Dr. Mann/Dr. Elnoria Howard reconsulted.  Underwent sigmoidoscopy on 10/07/2021.  Assessment & Plan:   Acute metabolic encephalopathy --MRI brain showed bilateral white matter lesions concerning for active demyelinating disease -S/p IV steroids for 5 days with no significant improvement -Currently undergoing plasma exchange as per neurology: Patient will be getting total of 5 sessions; completed second session on 10/08/2021.  Follow further neurology recommendations.  Hemorrhagic shock/GI bleed -Patient has had multiple endoscopic procedures last month including EGD/capsule endoscopy/coil embolization of jejunal branch of SMA. -Possibly from stress ulceration/ischemic disease as per GI: Status post sigmoidoscopy on 10/07/2021 which showed single solitary ulcer in the sigmoid colon which was biopsied. -Has  had multiple packed red cell transfusion during this hospitalization. -Monitor H&H.  Hemoglobin 7.2 today.  Transfuse packed red cells if hemoglobin is less than 7.  Acute respiratory failure  with hypoxia status post tracheostomy Aspiration pneumonia -Status post tracheostomy placement on 09/15/2020.  PCCM following weekly. -Patient had acute aspiration event on 10/03/2021 and was placed on broad-spectrum antibiotics: Trach cultures grew MRSA and E. coli.  Antibiotics switched to Rocephin and vancomycin  E. coli cholecystitis status post percutaneous cholecystostomy drain placement -Had percutaneous cholecystostomy drain placement on 09/07/2021.  Subsequently had completed course of antibiotics -Will eventually need outpatient surgery/IR follow-up  Transaminitis -AST and ALT improving.  Monitor.  Follow GI recommendations.  Thrombocytopenia -Monitor.  Diabetes mellitus type 2 with hyperglycemia -Continue long acting insulin along with CBGs with SSI  Acute kidney injury: Resolved -CRRT discontinued on 09/12/2021  Hypokalemia--improved  Hypophosphatemia -Replace.  Hypertension -Continue monitoring blood pressure.  Currently on doxazosin   Obesity -Outpatient follow-up  DVT prophylaxis: SCDs Code Status: Full Family Communication: Wife at bedside Disposition Plan: Status is: Inpatient Remains inpatient appropriate because: Of severity of illness    Consultants: GI/neurology/PCCM/general surgery/nephrology/IR  Procedures: As above  Antimicrobials:  Anti-infectives (From admission, onward)    Start     Dose/Rate Route Frequency Ordered Stop   10/06/21 1515  cefTRIAXone (ROCEPHIN) 2 g in sodium chloride 0.9 % 100 mL IVPB        2 g 200 mL/hr over 30 Minutes Intravenous Every 24 hours 10/06/21 1420 10/10/21 1514   10/04/21 0945  metroNIDAZOLE (FLAGYL) IVPB 500 mg  Status:  Discontinued        500 mg 100 mL/hr over 60 Minutes Intravenous Every 12 hours 10/04/21 0853  10/06/21 1329   10/03/21 2200  vancomycin (VANCOREADY) IVPB 1500 mg/300 mL        1,500 mg 150 mL/hr over 120 Minutes Intravenous Every 12 hours 10/03/21 0613 10/09/21 2359   10/03/21 0700  vancomycin (VANCOREADY) IVPB 2000 mg/400 mL        2,000 mg 200 mL/hr over 120 Minutes Intravenous  Once 10/03/21 0613 10/03/21 1149   10/03/21 0700  ceFEPIme (MAXIPIME) 2 g in sodium chloride 0.9 % 100 mL IVPB  Status:  Discontinued        2 g 200 mL/hr over 30 Minutes Intravenous Every 8 hours 10/03/21 0613 10/06/21 1329   10/03/21 0645  vancomycin (VANCOREADY) IVPB 2000 mg/400 mL  Status:  Discontinued        2,000 mg 200 mL/hr over 120 Minutes Intravenous  Once 10/03/21 0549 10/03/21 0748   10/03/21 0645  ceFEPIme (MAXIPIME) 2 g in sodium chloride 0.9 % 100 mL IVPB  Status:  Discontinued        2 g 200 mL/hr over 30 Minutes Intravenous  Once 10/03/21 0549 10/03/21 1121   10/03/21 0600  vancomycin (VANCOREADY) IVPB 1500 mg/300 mL  Status:  Discontinued        1,500 mg 150 mL/hr over 120 Minutes Intravenous On call 10/02/21 1334 10/03/21 0549   09/11/21 1100  cefTRIAXone (ROCEPHIN) 2 g in sodium chloride 0.9 % 100 mL IVPB        2 g 200 mL/hr over 30 Minutes Intravenous Every 24 hours 09/11/21 1010 09/13/21 1108   09/08/21 0200  piperacillin-tazobactam (ZOSYN) IVPB 3.375 g  Status:  Discontinued        3.375 g 12.5 mL/hr over 240 Minutes Intravenous Every 8 hours 09/07/21 1856 09/07/21 2114   09/07/21 2200  meropenem (MERREM) 1 g in sodium chloride 0.9 % 100 mL IVPB  Status:  Discontinued        1 g 200 mL/hr over 30 Minutes Intravenous Every 8  hours 09/07/21 2114 09/11/21 1010   09/07/21 1945  piperacillin-tazobactam (ZOSYN) IVPB 3.375 g        3.375 g 100 mL/hr over 30 Minutes Intravenous  Once 09/07/21 1856 09/07/21 2005   09/07/21 1753  cefTRIAXone (ROCEPHIN) injection         Intravenous As needed 09/07/21 1754 09/07/21 1753   09/07/21 1749  sodium chloride 0.9 % with cefTRIAXone (ROCEPHIN)  ADS Med       Note to Pharmacy: Eveline Keto E: cabinet override      09/07/21 1749 09/07/21 1935        Subjective: Patient seen and examined at bedside.  No seizures, vomiting or agitation, vomiting reported. Objective: Vitals:   10/09/21 0500 10/09/21 0600 10/09/21 0700 10/09/21 0731  BP: 115/64 132/69 134/76   Pulse: 94 99 97   Resp: 18 (!) 26 20   Temp:    98.3 F (36.8 C)  TempSrc:    Axillary  SpO2: 96% 98% 93%   Weight:      Height:        Intake/Output Summary (Last 24 hours) at 10/09/2021 0744 Last data filed at 10/09/2021 0400 Gross per 24 hour  Intake 3340.05 ml  Output 2157 ml  Net 1183.05 ml    Filed Weights   10/08/21 0357 10/08/21 1620 10/09/21 0348  Weight: 125.3 kg 129.8 kg 126.5 kg    Examination:  General: Chronically ill and deconditioned looking.  No distress ENT/neck: Has trach.  Currently on 5 L oxygen via trach.  Respiratory: Decreased breath sounds at bases bilaterally with some crackles; no wheezing.  Intermittently tachypneic CVS: S1-S2 heard, rate controlled currently Abdominal: Soft, nontender, slightly distended; no organomegaly, bowel sounds are heard Extremities: Trace lower extremity edema; no cyanosis  CNS: Awake, still does not follow commands.  No focal neurologic deficit.   Lymph: No obvious lymphadenopathy Skin: No obvious ecchymosis/lesions  psych: Cannot assess because of mental status  musculoskeletal: No obvious joint swelling/deformity    Data Reviewed: I have personally reviewed following labs and imaging studies  CBC: Recent Labs  Lab 10/05/21 0344 10/06/21 0141 10/06/21 1230 10/07/21 0306 10/07/21 2317 10/08/21 0413 10/09/21 0334  WBC 9.1 7.7 8.0 7.4  --  8.3 8.1  NEUTROABS 7.9* 6.4  --  5.7  --  6.4 7.0  HGB 8.9* 8.3* 7.8* 7.2* 7.2* 7.6* 7.2*  HCT 27.4* 26.2* 24.9* 22.5* 22.9* 24.0* 22.6*  MCV 89.0 89.7 89.6 89.3  --  89.2 90.8  PLT 158 147* 160 129*  --  148* 131*    Basic Metabolic Panel: Recent  Labs  Lab 10/05/21 0344 10/06/21 0307 10/07/21 0306 10/08/21 0413 10/08/21 1610 10/09/21 0334  NA 139 138 140 137 139 139  K 3.8 3.6 3.4* 3.9 3.6 3.6  CL 107 111 113* 106 110 113*  CO2 23 22 21* 23 24 22   GLUCOSE 225* 234* 249* 326* 289* 302*  BUN 17 11 9 7 11 10   CREATININE 0.63 0.59* 0.50* 0.53* 0.45* 0.50*  CALCIUM 8.2* 7.7* 7.8* 7.7* 7.8* 7.9*  MG 2.0 1.9 1.7 1.8  --  1.9  PHOS 2.2* 1.9* 1.8* 3.1  --  1.9*    GFR: Estimated Creatinine Clearance: 136.7 mL/min (A) (by C-G formula based on SCr of 0.5 mg/dL (L)). Liver Function Tests: Recent Labs  Lab 10/07/21 0306 10/08/21 0413 10/09/21 0334  AST 87* 136* 57*  ALT 81* 175* 81*  ALKPHOS 145* 189* 69  BILITOT 0.6 0.7 0.8  PROT 4.4*  4.5* 4.7*  ALBUMIN 3.3* 2.9* 3.7    Recent Labs  Lab 10/03/21 0816  LIPASE 42    No results for input(s): "AMMONIA" in the last 168 hours. Coagulation Profile: Recent Labs  Lab 10/03/21 0816  INR 1.0    Cardiac Enzymes: No results for input(s): "CKTOTAL", "CKMB", "CKMBINDEX", "TROPONINI" in the last 168 hours. BNP (last 3 results) No results for input(s): "PROBNP" in the last 8760 hours. HbA1C: No results for input(s): "HGBA1C" in the last 72 hours. CBG: Recent Labs  Lab 10/08/21 1528 10/08/21 1904 10/08/21 2215 10/08/21 2314 10/09/21 0311  GLUCAP 253* 232* 273* 258* 279*    Lipid Profile: No results for input(s): "CHOL", "HDL", "LDLCALC", "TRIG", "CHOLHDL", "LDLDIRECT" in the last 72 hours. Thyroid Function Tests: No results for input(s): "TSH", "T4TOTAL", "FREET4", "T3FREE", "THYROIDAB" in the last 72 hours. Anemia Panel: No results for input(s): "VITAMINB12", "FOLATE", "FERRITIN", "TIBC", "IRON", "RETICCTPCT" in the last 72 hours. Sepsis Labs: Recent Labs  Lab 10/03/21 0816 10/03/21 1511  LATICACIDVEN 2.1* 1.9     Recent Results (from the past 240 hour(s))  Culture, blood (x 2)     Status: None   Collection Time: 10/03/21  8:14 AM   Specimen: BLOOD LEFT  HAND  Result Value Ref Range Status   Specimen Description BLOOD LEFT HAND IN PEDIATRIC BOTTLE  Final   Special Requests   Final    BOTTLES DRAWN AEROBIC ONLY Blood Culture adequate volume   Culture   Final    NO GROWTH 5 DAYS Performed at [facility], [address], [place], [place] [address]    Report Status 10/08/2021 FINAL  Final  Culture, blood (x 2)     Status: None   Collection Time: 10/03/21  8:16 AM   Specimen: BLOOD  Result Value Ref Range Status   Specimen Description BLOOD BLOOD RIGHT ARM  Final   Special Requests   Final    BOTTLES DRAWN AEROBIC AND ANAEROBIC Blood Culture adequate volume   Culture   Final    NO GROWTH 5 DAYS Performed at [facility], [address], [place], [place] [address]    Report Status 10/08/2021 FINAL  Final  Culture, Respiratory w Gram Stain     Status: None   Collection Time: 10/03/21  6:15 PM   Specimen: Tracheal Aspirate; Respiratory  Result Value Ref Range Status   Specimen Description TRACHEAL ASPIRATE  Final   Special Requests NONE  Final   Gram Stain   Final    ABUNDANT WBC PRESENT, PREDOMINANTLY PMN FEW GRAM NEGATIVE RODS FEW GRAM POSITIVE COCCI IN PAIRS Performed at [facility], [address] [place], [place] [address]    Culture   Final    MODERATE METHICILLIN RESISTANT STAPHYLOCOCCUS AUREUS RARE ESCHERICHIA COLI    Report Status 10/06/2021 FINAL  Final   Organism ID, Bacteria ESCHERICHIA COLI  Final   Organism ID, Bacteria METHICILLIN RESISTANT STAPHYLOCOCCUS AUREUS  Final      Susceptibility   Escherichia coli - MIC*    AMPICILLIN 16 INTERMEDIATE Intermediate     CEFAZOLIN <=4 SENSITIVE Sensitive     CEFEPIME <=0.12 SENSITIVE Sensitive     CEFTAZIDIME <=1 SENSITIVE Sensitive     CEFTRIAXONE <=0.25 SENSITIVE Sensitive     CIPROFLOXACIN <=0.25 SENSITIVE Sensitive     GENTAMICIN <=1 SENSITIVE Sensitive     IMIPENEM <=0.25 SENSITIVE Sensitive     TRIMETH/SULFA <=20 SENSITIVE Sensitive      AMPICILLIN/SULBACTAM 4 SENSITIVE  Sensitive     PIP/TAZO <=4 SENSITIVE Sensitive     * RARE ESCHERICHIA COLI   Methicillin resistant staphylococcus aureus - MIC*    CIPROFLOXACIN >=8 RESISTANT Resistant     ERYTHROMYCIN <=0.25 SENSITIVE Sensitive     GENTAMICIN <=0.5 SENSITIVE Sensitive     OXACILLIN >=4 RESISTANT Resistant     TETRACYCLINE <=1 SENSITIVE Sensitive     VANCOMYCIN 1 SENSITIVE Sensitive     TRIMETH/SULFA >=320 RESISTANT Resistant     CLINDAMYCIN <=0.25 SENSITIVE Sensitive     RIFAMPIN <=0.5 SENSITIVE Sensitive     Inducible Clindamycin NEGATIVE Sensitive     * MODERATE METHICILLIN RESISTANT STAPHYLOCOCCUS AUREUS         Radiology Studies: No results found.      Scheduled Meds:  Chlorhexidine Gluconate Cloth  6 each Topical Daily   doxazosin  2 mg Per Tube Daily   feeding supplement (PROSource TF20)  60 mL Per Tube BID   free water  250 mL Per Tube Q6H   influenza vac split quadrivalent PF  0.5 mL Intramuscular Tomorrow-1000   insulin aspart  0-15 Units Subcutaneous Q4H   insulin aspart  4 Units Subcutaneous Q4H   insulin glargine-yfgn  30 Units Subcutaneous BID   mouth rinse  15 mL Mouth Rinse 4 times per day   pantoprazole  40 mg Per Tube BID   pneumococcal 23 valent vaccine  0.5 mL Intramuscular Tomorrow-1000   rosuvastatin  40 mg Per Tube Daily   sodium chloride flush  10-40 mL Intracatheter Q12H   sodium chloride flush  5 mL Intracatheter Q8H   Zinc Oxide   Topical BID   Continuous Infusions:  sodium chloride 10 mL/hr at 10/09/21 0400   sodium chloride 10 mL/hr at 10/07/21 1200   sodium chloride Stopped (10/07/21 1454)   cefTRIAXone (ROCEPHIN)  IV Stopped (10/08/21 1519)   citrate dextrose     dextrose Stopped (10/04/21 1008)   dextrose     feeding supplement (VITAL 1.5 CAL) 1,000 mL (10/09/21 0456)   lactated ringers     vancomycin Stopped (10/08/21 2322)          Glade Lloyd, MD [facility] 10/09/2021, 7:44 AM

## 2021-10-10 DIAGNOSIS — G9341 Metabolic encephalopathy: Secondary | ICD-10-CM | POA: Diagnosis not present

## 2021-10-10 DIAGNOSIS — J9611 Chronic respiratory failure with hypoxia: Secondary | ICD-10-CM | POA: Diagnosis not present

## 2021-10-10 DIAGNOSIS — N179 Acute kidney failure, unspecified: Secondary | ICD-10-CM | POA: Diagnosis not present

## 2021-10-10 DIAGNOSIS — K810 Acute cholecystitis: Secondary | ICD-10-CM | POA: Diagnosis not present

## 2021-10-10 LAB — COMPREHENSIVE METABOLIC PANEL
ALT: 75 U/L — ABNORMAL HIGH (ref 0–44)
AST: 40 U/L (ref 15–41)
Albumin: 3.3 g/dL — ABNORMAL LOW (ref 3.5–5.0)
Alkaline Phosphatase: 71 U/L (ref 38–126)
Anion gap: 6 (ref 5–15)
BUN: 8 mg/dL (ref 6–20)
CO2: 22 mmol/L (ref 22–32)
Calcium: 8 mg/dL — ABNORMAL LOW (ref 8.9–10.3)
Chloride: 109 mmol/L (ref 98–111)
Creatinine, Ser: 0.46 mg/dL — ABNORMAL LOW (ref 0.61–1.24)
GFR, Estimated: >60 mL/min (ref >60)
Glucose, Bld: 256 mg/dL — ABNORMAL HIGH (ref 70–99)
Potassium: 3.7 mmol/L (ref 3.5–5.1)
Sodium: 137 mmol/L (ref 135–145)
Total Bilirubin: 0.6 mg/dL (ref 0.3–1.2)
Total Protein: 4.6 g/dL — ABNORMAL LOW (ref 6.5–8.1)

## 2021-10-10 LAB — GLUCOSE, CAPILLARY
Glucose-Capillary: 186 mg/dL — ABNORMAL HIGH (ref 70–99)
Glucose-Capillary: 191 mg/dL — ABNORMAL HIGH (ref 70–99)
Glucose-Capillary: 203 mg/dL — ABNORMAL HIGH (ref 70–99)
Glucose-Capillary: 205 mg/dL — ABNORMAL HIGH (ref 70–99)
Glucose-Capillary: 238 mg/dL — ABNORMAL HIGH (ref 70–99)
Glucose-Capillary: 242 mg/dL — ABNORMAL HIGH (ref 70–99)
Glucose-Capillary: 259 mg/dL — ABNORMAL HIGH (ref 70–99)

## 2021-10-10 LAB — CBC WITH DIFFERENTIAL/PLATELET
Abs Immature Granulocytes: 0.45 10*3/uL — ABNORMAL HIGH (ref 0.00–0.07)
Basophils Absolute: 0 10*3/uL (ref 0.0–0.1)
Basophils Relative: 0 %
Eosinophils Absolute: 0.3 10*3/uL (ref 0.0–0.5)
Eosinophils Relative: 3 %
HCT: 23.1 % — ABNORMAL LOW (ref 39.0–52.0)
Hemoglobin: 7.3 g/dL — ABNORMAL LOW (ref 13.0–17.0)
Immature Granulocytes: 5 %
Lymphocytes Relative: 11 %
Lymphs Abs: 1.1 10*3/uL (ref 0.7–4.0)
MCH: 28.9 pg (ref 26.0–34.0)
MCHC: 31.6 g/dL (ref 30.0–36.0)
MCV: 91.3 fL (ref 80.0–100.0)
Monocytes Absolute: 0.4 10*3/uL (ref 0.1–1.0)
Monocytes Relative: 4 %
Neutro Abs: 7.3 10*3/uL (ref 1.7–7.7)
Neutrophils Relative %: 77 %
Platelets: 158 10*3/uL (ref 150–400)
RBC: 2.53 MIL/uL — ABNORMAL LOW (ref 4.22–5.81)
RDW: 17.3 % — ABNORMAL HIGH (ref 11.5–15.5)
WBC: 9.5 10*3/uL (ref 4.0–10.5)
nRBC: 0 % (ref 0.0–0.2)

## 2021-10-10 LAB — PHOSPHORUS: Phosphorus: 3.1 mg/dL (ref 2.5–4.6)

## 2021-10-10 LAB — MAGNESIUM: Magnesium: 1.9 mg/dL (ref 1.7–2.4)

## 2021-10-10 MED ORDER — ACD FORMULA A 0.73-2.45-2.2 GM/100ML VI SOLN
1000.0000 mL | Status: DC
  Filled 2021-10-10 (×3): qty 1000

## 2021-10-10 MED ORDER — CALCIUM CARBONATE ANTACID 500 MG PO CHEW
2.0000 | CHEWABLE_TABLET | ORAL | Status: AC
  Administered 2021-10-10: 400 mg via ORAL
  Filled 2021-10-10 (×2): qty 2

## 2021-10-10 MED ORDER — CALCIUM GLUCONATE-NACL 2-0.675 GM/100ML-% IV SOLN
INTRAVENOUS | Status: AC
  Administered 2021-10-10: 2000 mg
  Filled 2021-10-10: qty 100

## 2021-10-10 MED ORDER — CALCIUM GLUCONATE-NACL 2-0.675 GM/100ML-% IV SOLN
2.0000 g | Freq: Once | INTRAVENOUS | Status: AC
  Administered 2021-10-13: 2000 mg via INTRAVENOUS

## 2021-10-10 MED ORDER — DIPHENHYDRAMINE HCL 25 MG PO CAPS
25.0000 mg | ORAL_CAPSULE | Freq: Four times a day (QID) | ORAL | Status: DC | PRN
  Administered 2021-10-10: 25 mg via ORAL
  Filled 2021-10-10: qty 1

## 2021-10-10 MED ORDER — SODIUM CHLORIDE 0.9 % IV SOLN
INTRAVENOUS | Status: AC
  Filled 2021-10-10 (×5): qty 200

## 2021-10-10 MED ORDER — ACD FORMULA A 0.73-2.45-2.2 GM/100ML VI SOLN
Status: AC
  Administered 2021-10-10: 1000 mL
  Filled 2021-10-10: qty 2000

## 2021-10-10 MED ORDER — INSULIN ASPART 100 UNIT/ML IJ SOLN
10.0000 [IU] | INTRAMUSCULAR | Status: DC
  Administered 2021-10-10 (×2): 10 [IU] via SUBCUTANEOUS
  Administered 2021-10-10: 8 [IU] via SUBCUTANEOUS
  Administered 2021-10-11 – 2021-10-18 (×46): 10 [IU] via SUBCUTANEOUS

## 2021-10-10 MED ORDER — ACETAMINOPHEN 325 MG PO TABS
650.0000 mg | ORAL_TABLET | ORAL | Status: DC | PRN
  Administered 2021-10-10: 650 mg via ORAL
  Filled 2021-10-10: qty 2

## 2021-10-10 MED ORDER — HEPARIN SODIUM (PORCINE) 1000 UNIT/ML IJ SOLN
1000.0000 [IU] | Freq: Once | INTRAMUSCULAR | Status: AC
  Administered 2021-10-10: 1000 [IU]
  Filled 2021-10-10: qty 1

## 2021-10-10 NOTE — Inpatient Diabetes Management (Signed)
Inpatient Diabetes Program Recommendations  AACE/ADA: New Consensus Statement on Inpatient Glycemic Control (2015)  Target Ranges:  Prepandial:   less than 140 mg/dL      Peak postprandial:   less than 180 mg/dL (1-2 hours)      Critically ill patients:  140 - 180 mg/dL   Lab Results  Component Value Date   GLUCAP 238 (H) 10/10/2021   HGBA1C 6.2 (H) 04/29/2021    Latest Reference Range & Units 10/09/21 15:20 10/09/21 19:28 10/09/21 23:23 10/10/21 03:33 10/10/21 07:53  Glucose-Capillary 70 - 99 mg/dL 182 (H) 209 (H) 230 (H) 242 (H) 238 (H)  (H): Data is abnormally high  Current orders for Inpatient glycemic control:  Semglee 35 units bid, Novolog 0-15 units q 4 hours and HS coverage Vital 60 ml/hr, Novolog 8 units q 4 hours   Inpatient Diabetes Program Recommendations:   May consider increasing Novolog tube feed coverage to 10 units q 4 hours to cover CHO in tube feeds (hold if tube feeds held for any reason).  :   Thank you, Nani Gasser. Murle Otting, RN, MSN, CDE  Diabetes Coordinator Inpatient Glycemic Control Team Team Pager [phone] (8am-5pm) 10/10/2021 8:23 AM

## 2021-10-10 NOTE — Progress Notes (Signed)
Pt transported from 58M to 5N13 w/out incidence.

## 2021-10-10 NOTE — Progress Notes (Signed)
PROGRESS NOTE    Troy Randall  ZHY:[id number] DOB: Apr 20, 1960 DOA: 09/04/2021 PCP: Benita Stabile, MD   Brief Narrative:  61 years old male with medical history of hypertension, hyperlipidemia, type 2 diabetes, CVA, presented to the hospital with GI bleed.  Patient underwent IR embolization followed by percutaneous cholecystostomy tube placement due to ongoing altered mental status.  MRI of the brain showed numerous contrast-enhancing bilateral white matter lesions concerning for demyelinating disease.  Neurology was consulted and underwent lumbar puncture on 09/22/2021 with low suspicion for infectious etiology.  Neurology has initiated the patient on high-dose steroids on 09/23/2021.  Patient was then transferred to hospitalist service on 09/24/2021.  Significant events. 8/31 presented to [facility], [name] Admit, GI Consult 9/1 CTA> no evidence of active GI bleed, 2 U PRBC 9/2 2 U PRBC, colonoscopy > no active bleed found but large clots throughout entire colon. EGD> small hiatal hernia, erosive gastropathy with no stigmata of recent bleeding, and non-bleeding duodenal diverticulum.   Underwent then small bowel enteroscopy which noted intermittent blood pumping in the proximal jejunum but was unable to be reached despite multiple attempts, was injected and clip placed. Repeat CTA repeated, which showed positive jejunal diverticular bleeding. IR> active extrav at [facility] territory, jejunal arcade branch, successful coil embo 9/3 Intubated. CTA> no evidence of active GI bleeding.  New finding of abnormal gallbladder with nondependent air, gallbladder wall air, pericholecystic and right upper quadrant information.  IR placed percutaneous cholecystotomy tube.  9/4 R. IJ central line placed with dialysis triple lumen catheter 9/5 CRRT initiated 9/6 Bedside EGD without any evidence of bleeding. CTA a/p without etiology for bleed 9/8 CRRT discontinued. Required ETT exchange yesterday due to blown cuff. 9/11  Bronchoscopic guided percutaneous tracheostomy placement.  Right IJ central line removed. 9/15 flexible sigmoidoscopy with removal of endoscopy 9/16 MRI brain w/o contrast amended study secondary to range of motion.  Rapid development numerous round white matter lesions in both cerebral hemispheres.  Prior MRIs. 9/17 MRI brain: Numerous contrast-enhancing bilateral white matter lesions, consistent with active demyelination. 9/18 Lumbar puncture  9/19 MRI cervical spine, thoracic : Limited exam, within limitations there is not definitive evidence of active demyelinization. Within limitations of motion artifact, there may be demyelinating lesions at the C6 and T1 vertebral body levels. 9/25-clinical exam not improved despite completion of 5-day IV steroids.  Neurology planning to repeat MRI of the brain cervical spine thoracic spine. Possible IR consult for catheter placement for possible Plex therapy if abnormal scans 9/26- scans attempted with ativan 9/29-patient transferred to ICU for respiratory distress due to aspiration pneumonia with sepsis 10/2-patient transferred back to [facility]; started having GI bleed, Dr. Mann/Dr. Elnoria Howard reconsulted.  Underwent sigmoidoscopy on 10/07/2021.  Assessment & Plan:   Acute metabolic encephalopathy --MRI brain showed bilateral white matter lesions concerning for active demyelinating disease -S/p IV steroids for 5 days with no significant improvement -Currently undergoing plasma exchange as per neurology: Patient will be getting total of 5 sessions; completed second session on 10/08/2021.  Follow further neurology recommendations.  Hemorrhagic shock/GI bleed -Patient has had multiple endoscopic procedures last month including EGD/capsule endoscopy/coil embolization of jejunal branch of SMA. -Possibly from stress ulceration/ischemic disease as per GI: Status post sigmoidoscopy on 10/07/2021 which showed single solitary ulcer in the sigmoid colon which was biopsied. -Has  had multiple packed red cell transfusion during this hospitalization. -Monitor H&H.  Hemoglobin 7.3 today.  Transfuse packed red cells if hemoglobin is less than 7.  Acute respiratory failure  with hypoxia status post tracheostomy Aspiration pneumonia -Status post tracheostomy placement on 09/15/2020.  PCCM following weekly. -Patient had acute aspiration event on 10/03/2021 and was placed on broad-spectrum antibiotics: Trach cultures grew MRSA and E. coli.  Antibiotics switched to Rocephin and vancomycin: Will complete 7-day course of antibiotic therapy  E. coli cholecystitis status post percutaneous cholecystostomy drain placement -Had percutaneous cholecystostomy drain placement on 09/07/2021.  Subsequently had completed course of antibiotics -Will eventually need outpatient surgery/IR follow-up  Transaminitis -AST and ALT improving.  Monitor.  Follow GI recommendations.  Thrombocytopenia -Monitor.  Diabetes mellitus type 2 with hyperglycemia -Continue long acting insulin along with CBGs with SSI  Acute kidney injury: Resolved -CRRT discontinued on 09/12/2021  Hypokalemia--improved  Hypophosphatemia -Replace.  Hypertension -Continue monitoring blood pressure.  Currently on doxazosin   Obesity -Outpatient follow-up  DVT prophylaxis: SCDs Code Status: Full Family Communication: Wife at bedside Disposition Plan: Status is: Inpatient Remains inpatient appropriate because: Of severity of illness    Consultants: GI/neurology/PCCM/general surgery/nephrology/IR  Procedures: As above  Antimicrobials:  Anti-infectives (From admission, onward)    Start     Dose/Rate Route Frequency Ordered Stop   10/06/21 1515  cefTRIAXone (ROCEPHIN) 2 g in sodium chloride 0.9 % 100 mL IVPB        2 g 200 mL/hr over 30 Minutes Intravenous Every 24 hours 10/06/21 1420 10/09/21 1756   10/04/21 0945  metroNIDAZOLE (FLAGYL) IVPB 500 mg  Status:  Discontinued        500 mg 100 mL/hr over 60  Minutes Intravenous Every 12 hours 10/04/21 0853 10/06/21 1329   10/03/21 2200  vancomycin (VANCOREADY) IVPB 1500 mg/300 mL        1,500 mg 150 mL/hr over 120 Minutes Intravenous Every 12 hours 10/03/21 0613 10/10/21 0025   10/03/21 0700  vancomycin (VANCOREADY) IVPB 2000 mg/400 mL        2,000 mg 200 mL/hr over 120 Minutes Intravenous  Once 10/03/21 0613 10/03/21 1149   10/03/21 0700  ceFEPIme (MAXIPIME) 2 g in sodium chloride 0.9 % 100 mL IVPB  Status:  Discontinued        2 g 200 mL/hr over 30 Minutes Intravenous Every 8 hours 10/03/21 0613 10/06/21 1329   10/03/21 0645  vancomycin (VANCOREADY) IVPB 2000 mg/400 mL  Status:  Discontinued        2,000 mg 200 mL/hr over 120 Minutes Intravenous  Once 10/03/21 0549 10/03/21 0748   10/03/21 0645  ceFEPIme (MAXIPIME) 2 g in sodium chloride 0.9 % 100 mL IVPB  Status:  Discontinued        2 g 200 mL/hr over 30 Minutes Intravenous  Once 10/03/21 0549 10/03/21 1121   10/03/21 0600  vancomycin (VANCOREADY) IVPB 1500 mg/300 mL  Status:  Discontinued        1,500 mg 150 mL/hr over 120 Minutes Intravenous On call 10/02/21 1334 10/03/21 0549   09/11/21 1100  cefTRIAXone (ROCEPHIN) 2 g in sodium chloride 0.9 % 100 mL IVPB        2 g 200 mL/hr over 30 Minutes Intravenous Every 24 hours 09/11/21 1010 09/13/21 1108   09/08/21 0200  piperacillin-tazobactam (ZOSYN) IVPB 3.375 g  Status:  Discontinued        3.375 g 12.5 mL/hr over 240 Minutes Intravenous Every 8 hours 09/07/21 1856 09/07/21 2114   09/07/21 2200  meropenem (MERREM) 1 g in sodium chloride 0.9 % 100 mL IVPB  Status:  Discontinued        1 g 200  mL/hr over 30 Minutes Intravenous Every 8 hours 09/07/21 2114 09/11/21 1010   09/07/21 1945  piperacillin-tazobactam (ZOSYN) IVPB 3.375 g        3.375 g 100 mL/hr over 30 Minutes Intravenous  Once 09/07/21 1856 09/07/21 2005   09/07/21 1753  cefTRIAXone (ROCEPHIN) injection         Intravenous As needed 09/07/21 1754 09/07/21 1753   09/07/21 1749   sodium chloride 0.9 % with cefTRIAXone (ROCEPHIN) ADS Med       Note to Pharmacy: Eveline Keto E: cabinet override      09/07/21 1749 09/07/21 1935        Subjective: Patient seen and examined at bedside.  No agitation, seizures, vomiting, fever reported.   Objective: Vitals:   10/10/21 0337 10/10/21 0400 10/10/21 0420 10/10/21 0600  BP:  110/73  138/71  Pulse:  95 (!) 104 (!) 102  Resp:  16 20 (!) 24  Temp: 98.3 F (36.8 C)     TempSrc: Axillary     SpO2:  96% 97% 99%  Weight:      Height:        Intake/Output Summary (Last 24 hours) at 10/10/2021 0800 Last data filed at 10/10/2021 0600 Gross per 24 hour  Intake 2712.9 ml  Output 2360 ml  Net 352.9 ml    Filed Weights   10/08/21 1620 10/09/21 0348 10/10/21 0333  Weight: 129.8 kg 126.5 kg 120.9 kg    Examination:  General: Chronically ill and deconditioned looking.  Currently in no distress.   ENT/neck: Still on 5 L oxygen via tracheostomy  respiratory: Bilateral decreased breath sounds at bases with scattered crackles and intermittent tachypnea  CVS: Mild intermittent tachycardia present; S1 and S2 are heard abdominal: Soft, obese, nontender, tender mildly; no organomegaly, normal bowel sounds heard Extremities: No clubbing; bilateral lower extremity edema present CNS: Does not follow commands; awake.  No use focal neurologic deficit.   Lymph: No palpable lymphadenopathy noted Skin: No obvious petechiae/rashes psych: Cannot be assessed because of mental status musculoskeletal: No obvious other joint swelling/deformity    Data Reviewed: I have personally reviewed following labs and imaging studies  CBC: Recent Labs  Lab 10/06/21 0141 10/06/21 1230 10/07/21 0306 10/07/21 2317 10/08/21 0413 10/09/21 0334 10/10/21 0317  WBC 7.7 8.0 7.4  --  8.3 8.1 9.5  NEUTROABS 6.4  --  5.7  --  6.4 7.0 7.3  HGB 8.3* 7.8* 7.2* 7.2* 7.6* 7.2* 7.3*  HCT 26.2* 24.9* 22.5* 22.9* 24.0* 22.6* 23.1*  MCV 89.7 89.6 89.3  --   89.2 90.8 91.3  PLT 147* 160 129*  --  148* 131* 419    Basic Metabolic Panel: Recent Labs  Lab 10/06/21 0307 10/07/21 0306 10/08/21 0413 10/08/21 1610 10/09/21 0334 10/10/21 0317  NA 138 140 137 139 139 137  K 3.6 3.4* 3.9 3.6 3.6 3.7  CL 111 113* 106 110 113* 109  CO2 22 21* 23 24 22 22   GLUCOSE 234* 249* 326* 289* 302* 256*  BUN 11 9 7 11 10 8   CREATININE 0.59* 0.50* 0.53* 0.45* 0.50* 0.46*  CALCIUM 7.7* 7.8* 7.7* 7.8* 7.9* 8.0*  MG 1.9 1.7 1.8  --  1.9 1.9  PHOS 1.9* 1.8* 3.1  --  1.9* 3.1    GFR: Estimated Creatinine Clearance: 133.5 mL/min (A) (by C-G formula based on SCr of 0.46 mg/dL (L)). Liver Function Tests: Recent Labs  Lab 10/07/21 0306 10/08/21 0413 10/09/21 0334 10/10/21 0317  AST 87* 136* 57*  40  ALT 81* 175* 81* 75*  ALKPHOS 145* 189* 69 71  BILITOT 0.6 0.7 0.8 0.6  PROT 4.4* 4.5* 4.7* 4.6*  ALBUMIN 3.3* 2.9* 3.7 3.3*    Recent Labs  Lab 10/03/21 0816  LIPASE 42    No results for input(s): "AMMONIA" in the last 168 hours. Coagulation Profile: Recent Labs  Lab 10/03/21 0816  INR 1.0    Cardiac Enzymes: No results for input(s): "CKTOTAL", "CKMB", "CKMBINDEX", "TROPONINI" in the last 168 hours. BNP (last 3 results) No results for input(s): "PROBNP" in the last 8760 hours. HbA1C: No results for input(s): "HGBA1C" in the last 72 hours. CBG: Recent Labs  Lab 10/09/21 1520 10/09/21 1928 10/09/21 2323 10/10/21 0333 10/10/21 0753  GLUCAP 182* 209* 230* 242* 238*    Lipid Profile: No results for input(s): "CHOL", "HDL", "LDLCALC", "TRIG", "CHOLHDL", "LDLDIRECT" in the last 72 hours. Thyroid Function Tests: No results for input(s): "TSH", "T4TOTAL", "FREET4", "T3FREE", "THYROIDAB" in the last 72 hours. Anemia Panel: No results for input(s): "VITAMINB12", "FOLATE", "FERRITIN", "TIBC", "IRON", "RETICCTPCT" in the last 72 hours. Sepsis Labs: Recent Labs  Lab 10/03/21 0816 10/03/21 1511  LATICACIDVEN 2.1* 1.9     Recent Results  (from the past 240 hour(s))  Culture, blood (x 2)     Status: None   Collection Time: 10/03/21  8:14 AM   Specimen: BLOOD LEFT HAND  Result Value Ref Range Status   Specimen Description BLOOD LEFT HAND IN PEDIATRIC BOTTLE  Final   Special Requests   Final    BOTTLES DRAWN AEROBIC ONLY Blood Culture adequate volume   Culture   Final    NO GROWTH 5 DAYS Performed at [facility], [address] [place], [place] [address]    Report Status 10/08/2021 FINAL  Final  Culture, blood (x 2)     Status: None   Collection Time: 10/03/21  8:16 AM   Specimen: BLOOD  Result Value Ref Range Status   Specimen Description BLOOD BLOOD RIGHT ARM  Final   Special Requests   Final    BOTTLES DRAWN AEROBIC AND ANAEROBIC Blood Culture adequate volume   Culture   Final    NO GROWTH 5 DAYS Performed at [facility], [address], [place], [place] [address]    Report Status 10/08/2021 FINAL  Final  Culture, Respiratory w Gram Stain     Status: None   Collection Time: 10/03/21  6:15 PM   Specimen: Tracheal Aspirate; Respiratory  Result Value Ref Range Status   Specimen Description TRACHEAL ASPIRATE  Final   Special Requests NONE  Final   Gram Stain   Final    ABUNDANT WBC PRESENT, PREDOMINANTLY PMN FEW GRAM NEGATIVE RODS FEW GRAM POSITIVE COCCI IN PAIRS Performed at [facility], [address], [place], [place] [address]    Culture   Final    MODERATE METHICILLIN RESISTANT STAPHYLOCOCCUS AUREUS RARE ESCHERICHIA COLI    Report Status 10/06/2021 FINAL  Final   Organism ID, Bacteria ESCHERICHIA COLI  Final   Organism ID, Bacteria METHICILLIN RESISTANT STAPHYLOCOCCUS AUREUS  Final      Susceptibility   Escherichia coli - MIC*    AMPICILLIN 16 INTERMEDIATE Intermediate     CEFAZOLIN <=4 SENSITIVE Sensitive     CEFEPIME <=0.12 SENSITIVE Sensitive     CEFTAZIDIME <=1 SENSITIVE Sensitive     CEFTRIAXONE <=0.25 SENSITIVE Sensitive     CIPROFLOXACIN <=0.25 SENSITIVE Sensitive      GENTAMICIN <=1 SENSITIVE  Sensitive     IMIPENEM <=0.25 SENSITIVE Sensitive     TRIMETH/SULFA <=20 SENSITIVE Sensitive     AMPICILLIN/SULBACTAM 4 SENSITIVE Sensitive     PIP/TAZO <=4 SENSITIVE Sensitive     * RARE ESCHERICHIA COLI   Methicillin resistant staphylococcus aureus - MIC*    CIPROFLOXACIN >=8 RESISTANT Resistant     ERYTHROMYCIN <=0.25 SENSITIVE Sensitive     GENTAMICIN <=0.5 SENSITIVE Sensitive     OXACILLIN >=4 RESISTANT Resistant     TETRACYCLINE <=1 SENSITIVE Sensitive     VANCOMYCIN 1 SENSITIVE Sensitive     TRIMETH/SULFA >=320 RESISTANT Resistant     CLINDAMYCIN <=0.25 SENSITIVE Sensitive     RIFAMPIN <=0.5 SENSITIVE Sensitive     Inducible Clindamycin NEGATIVE Sensitive     * MODERATE METHICILLIN RESISTANT STAPHYLOCOCCUS AUREUS         Radiology Studies: No results found.      Scheduled Meds:  calcium carbonate  2 tablet Oral Q3H   Chlorhexidine Gluconate Cloth  6 each Topical Daily   doxazosin  2 mg Per Tube Daily   feeding supplement (PROSource TF20)  60 mL Per Tube BID   free water  250 mL Per Tube Q6H   heparin sodium (porcine)  1,000 Units Intracatheter Once   influenza vac split quadrivalent PF  0.5 mL Intramuscular Tomorrow-1000   insulin aspart  0-15 Units Subcutaneous Q4H   insulin aspart  8 Units Subcutaneous Q4H   insulin glargine-yfgn  35 Units Subcutaneous BID   mouth rinse  15 mL Mouth Rinse 4 times per day   pantoprazole  40 mg Per Tube BID   pneumococcal 23 valent vaccine  0.5 mL Intramuscular Tomorrow-1000   rosuvastatin  40 mg Per Tube Daily   sodium chloride flush  10-40 mL Intracatheter Q12H   sodium chloride flush  5 mL Intracatheter Q8H   Zinc Oxide   Topical BID   Continuous Infusions:  sodium chloride 10 mL/hr at 10/10/21 0600   sodium chloride 10 mL/hr at 10/07/21 1200   sodium chloride Stopped (10/07/21 1454)   albumin human 25 % 50 g in sodium chloride 0.9 %     calcium gluconate     calcium gluconate     citrate  dextrose     citrate dextrose     dextrose Stopped (10/04/21 1008)   dextrose     feeding supplement (VITAL 1.5 CAL) 60 mL/hr at 10/10/21 0600   lactated ringers            Glade Lloyd, MD [facility] 10/10/2021, 8:00 AM

## 2021-10-10 NOTE — Progress Notes (Signed)
Referring Physician(s): Dr. Concepcion Living  Supervising Physician: Markus Daft  Patient Status:  [facility] - In-pt  Chief Complaint: Melena  Subjective: Remains intubated.  Perc chole tube remains in place.   Allergies: Metformin and related and Penicillins  Medications: Prior to Admission medications   Medication Sig Start Date End Date Taking? Authorizing Provider  amLODipine (NORVASC) 10 MG tablet Take 1 tablet (10 mg total) by mouth daily. 06/15/17  Yes Caren Macadam, MD  aspirin EC 81 MG tablet Take 81 mg by mouth daily. Swallow whole.   Yes [provider]  clopidogrel (PLAVIX) 75 MG tablet Take 1 tablet (75 mg total) by mouth daily. 04/30/21  Yes Little Ishikawa, MD  glipiZIDE (GLUCOTROL XL) 10 MG 24 hr tablet Take 1 tablet (10 mg total) by mouth daily with breakfast. 04/16/17  Yes Hagler, Apolonio Schneiders, MD  levocetirizine (XYZAL) 5 MG tablet Take 1 tablet by mouth at bedtime.   Yes [provider]  lisinopril (PRINIVIL,ZESTRIL) 40 MG tablet Take 1 tablet (40 mg total) by mouth daily. 05/12/17  Yes Caren Macadam, MD  metFORMIN (GLUCOPHAGE-XR) 500 MG 24 hr tablet Take 500-1,000 mg by mouth in the morning and at bedtime. 02/09/21  Yes [provider]  rosuvastatin (CRESTOR) 40 MG tablet Take 1 tablet (40 mg total) by mouth daily. 04/29/21  Yes Little Ishikawa, MD  tamsulosin (FLOMAX) 0.4 MG CAPS capsule Take 0.4 mg by mouth daily. 02/12/21  Yes [provider]  traZODone (DESYREL) 150 MG tablet Take 150 mg by mouth at bedtime. 02/04/21  Yes [provider]  TRESIBA FLEXTOUCH 200 UNIT/ML FlexTouch Pen Inject 20 Units into the skin in the morning and at bedtime. 03/14/21  Yes [provider]  BD VEO INSULIN SYRINGE U/F 31G X 15/64" 1 ML MISC  USE AS DIRECTED 06/08/17   Caren Macadam, MD  glucose blood (ONETOUCH VERIO) test strip TEST twice a day 05/04/17   Caren Macadam, MD  Insulin Pen Needle (NOVOTWIST) 32G X 5 MM MISC Use two daily to inject  Victoza and Toujeo. 04/25/15   Elayne Snare, MD  [facility] DELICA LANCETS 49F MISC Use to check blood sugar once a day dx code E11.65 11/21/14   Elayne Snare, MD     Vital Signs: BP (!) 114/55   Pulse 95   Temp 98.5 F (36.9 C) (Oral)   Resp (!) 22   Ht 6' (1.829 m)   Wt 266 lb 8.6 oz (120.9 kg)   SpO2 98%   BMI 36.15 kg/m   Physical Exam NAD, alert Abdomen: Distended. RUQ drain in place.  Bilious output in gravity bag.  Site intact, suture intact. Flushes and aspirates easily.   Imaging: No results found.  Labs:  CBC: Recent Labs    10/07/21 0306 10/07/21 2317 10/08/21 0413 10/09/21 0334 10/10/21 0317  WBC 7.4  --  8.3 8.1 9.5  HGB 7.2* 7.2* 7.6* 7.2* 7.3*  HCT 22.5* 22.9* 24.0* 22.6* 23.1*  PLT 129*  --  148* 131* 158    COAGS: Recent Labs    04/28/21 1504 09/05/21 0307 09/06/21 2240 09/07/21 2056 10/03/21 0816  INR 1.0 1.2 1.6* 2.0* 1.0  APTT 26  --   --  32 <20*    BMP: Recent Labs    10/08/21 0413 10/08/21 1610 10/09/21 0334 10/10/21 0317  NA 137 139 139 137  K 3.9 3.6 3.6 3.7  CL 106 110 113* 109  CO2 23 24 22 22   GLUCOSE 326* 289*  302* 256*  BUN 7 11 10 8   CALCIUM 7.7* 7.8* 7.9* 8.0*  CREATININE 0.53* 0.45* 0.50* 0.46*  GFRNONAA >60 >60 >60 >60    LIVER FUNCTION TESTS: Recent Labs    10/07/21 0306 10/08/21 0413 10/09/21 0334 10/10/21 0317  BILITOT 0.6 0.7 0.8 0.6  AST 87* 136* 57* 40  ALT 81* 175* 81* 75*  ALKPHOS 145* 189* 69 71  PROT 4.4* 4.5* 4.7* 4.6*  ALBUMIN 3.3* 2.9* 3.7 3.3*    Assessment and Plan: GI Bleed s/p coil embolization by Dr. Pascal Lux 9/3  Acute Cholecystitis s/p percutaneous cholecystostomy tube placement 9/3 by Dr. Pascal Lux. WBC improved to 9.5  Drain Location: RUQ Size: Fr size: 10 Fr Date of placement: 09/07/21  Currently to: Drain collection device: gravity 24 hour output:  Output by Drain (mL) 10/08/21 0701 - 10/08/21 1900 10/08/21 1901 - 10/09/21 0700 10/09/21 0701 - 10/09/21 1900 10/09/21 1901 -  10/10/21 0700 10/10/21 0701 - 10/10/21 1059  Closed System Drain 1 Lateral RUQ Other (Comment) 10.2 Fr. 0   10     Interval imaging/drain manipulation:  None  Current examination: Flushes/aspirates easily.  Insertion site unremarkable. Suture and stat lock in place. Dressed appropriately.   Plan: Continue TID flushes with 5 cc NS. Record output Q shift. Dressing changes QD or PRN if soiled.  Call IR APP or on call IR MD if difficulty flushing or sudden change in drain output.   Discharge planning: Please contact IR APP or on call IR MD prior to patient d/c to ensure appropriate follow up plans are in place.   IR will continue to follow - please call with questions or concerns.   Electronically Signed: Docia Barrier, PA 10/10/2021, 10:59 AM   I spent a total of 15 Minutes at the the patient's bedside AND on the patient's hospital floor or unit, greater than 50% of which was counseling/coordinating care for GI bleed, acute cholecystitis.

## 2021-10-11 DIAGNOSIS — D62 Acute posthemorrhagic anemia: Secondary | ICD-10-CM | POA: Diagnosis not present

## 2021-10-11 DIAGNOSIS — K810 Acute cholecystitis: Secondary | ICD-10-CM | POA: Diagnosis not present

## 2021-10-11 DIAGNOSIS — G9341 Metabolic encephalopathy: Secondary | ICD-10-CM | POA: Diagnosis not present

## 2021-10-11 DIAGNOSIS — J9611 Chronic respiratory failure with hypoxia: Secondary | ICD-10-CM | POA: Diagnosis not present

## 2021-10-11 LAB — COMPREHENSIVE METABOLIC PANEL
ALT: 34 U/L (ref 0–44)
AST: 26 U/L (ref 15–41)
Albumin: 3.7 g/dL (ref 3.5–5.0)
Alkaline Phosphatase: 37 U/L — ABNORMAL LOW (ref 38–126)
Anion gap: 7 (ref 5–15)
BUN: 8 mg/dL (ref 6–20)
CO2: 22 mmol/L (ref 22–32)
Calcium: 8.5 mg/dL — ABNORMAL LOW (ref 8.9–10.3)
Chloride: 110 mmol/L (ref 98–111)
Creatinine, Ser: 0.58 mg/dL — ABNORMAL LOW (ref 0.61–1.24)
GFR, Estimated: >60 mL/min (ref >60)
Glucose, Bld: 216 mg/dL — ABNORMAL HIGH (ref 70–99)
Potassium: 3.6 mmol/L (ref 3.5–5.1)
Sodium: 139 mmol/L (ref 135–145)
Total Bilirubin: 0.8 mg/dL (ref 0.3–1.2)
Total Protein: 4.7 g/dL — ABNORMAL LOW (ref 6.5–8.1)

## 2021-10-11 LAB — GLUCOSE, CAPILLARY
Glucose-Capillary: 216 mg/dL — ABNORMAL HIGH (ref 70–99)
Glucose-Capillary: 182 mg/dL — ABNORMAL HIGH (ref 70–99)
Glucose-Capillary: 223 mg/dL — ABNORMAL HIGH (ref 70–99)
Glucose-Capillary: 207 mg/dL — ABNORMAL HIGH (ref 70–99)
Glucose-Capillary: 203 mg/dL — ABNORMAL HIGH (ref 70–99)

## 2021-10-11 LAB — CBC WITH DIFFERENTIAL/PLATELET
Abs Immature Granulocytes: 0.15 10*3/uL — ABNORMAL HIGH (ref 0.00–0.07)
Basophils Absolute: 0 10*3/uL (ref 0.0–0.1)
Basophils Relative: 0 %
Eosinophils Absolute: 0.2 10*3/uL (ref 0.0–0.5)
Eosinophils Relative: 4 %
HCT: 23.9 % — ABNORMAL LOW (ref 39.0–52.0)
Hemoglobin: 7.1 g/dL — ABNORMAL LOW (ref 13.0–17.0)
Immature Granulocytes: 3 %
Lymphocytes Relative: 13 %
Lymphs Abs: 0.8 10*3/uL (ref 0.7–4.0)
MCH: 28.1 pg (ref 26.0–34.0)
MCHC: 29.7 g/dL — ABNORMAL LOW (ref 30.0–36.0)
MCV: 94.5 fL (ref 80.0–100.0)
Monocytes Absolute: 0.2 10*3/uL (ref 0.1–1.0)
Monocytes Relative: 4 %
Neutro Abs: 4.6 10*3/uL (ref 1.7–7.7)
Neutrophils Relative %: 76 %
Platelets: 148 10*3/uL — ABNORMAL LOW (ref 150–400)
RBC: 2.53 MIL/uL — ABNORMAL LOW (ref 4.22–5.81)
RDW: 17.9 % — ABNORMAL HIGH (ref 11.5–15.5)
WBC: 6 10*3/uL (ref 4.0–10.5)
nRBC: 0.3 % — ABNORMAL HIGH (ref 0.0–0.2)

## 2021-10-11 LAB — MAGNESIUM: Magnesium: 1.8 mg/dL (ref 1.7–2.4)

## 2021-10-11 LAB — PHOSPHORUS: Phosphorus: 2.9 mg/dL (ref 2.5–4.6)

## 2021-10-11 NOTE — Progress Notes (Signed)
Patient arrived on unit around 2130 on 10'06. Report received from ICU RN, Wells Guiles. Wide at bedside with patient

## 2021-10-11 NOTE — Progress Notes (Signed)
PROGRESS NOTE    Troy Randall  [id number] DOB: 11-15-1960 DOA: 09/04/2021 PCP: [name], MD   Brief Narrative:  61 years old male with medical history of hypertension, hyperlipidemia, type 2 diabetes, CVA, presented to the hospital with GI bleed.  Patient underwent IR embolization followed by percutaneous cholecystostomy tube placement due to ongoing altered mental status.  MRI of the brain showed numerous contrast-enhancing bilateral white matter lesions concerning for demyelinating disease.  Neurology was consulted and underwent lumbar puncture on 09/22/2021 with low suspicion for infectious etiology.  Neurology has initiated the patient on high-dose steroids on 09/23/2021.  Patient was then transferred to hospitalist service on 09/24/2021.  Significant events. 8/31 presented to [facility] ED, [name] Admit, GI Consult 9/1 CTA> no evidence of active GI bleed, 2 U PRBC 9/2 2 U PRBC, colonoscopy > no active bleed found but large clots throughout entire colon. EGD> small hiatal hernia, erosive gastropathy with no stigmata of recent bleeding, and non-bleeding duodenal diverticulum.   Underwent then small bowel enteroscopy which noted intermittent blood pumping in the proximal jejunum but was unable to be reached despite multiple attempts, was injected and clip placed. Repeat CTA repeated, which showed positive jejunal diverticular bleeding. IR> active extrav at [facility] territory, jejunal arcade branch, successful coil embo 9/3 Intubated. CTA> no evidence of active GI bleeding.  New finding of abnormal gallbladder with nondependent air, gallbladder wall air, pericholecystic and right upper quadrant information.  IR placed percutaneous cholecystotomy tube.  9/4 R. IJ central line placed with dialysis triple lumen catheter 9/5 CRRT initiated 9/6 Bedside EGD without any evidence of bleeding. CTA a/p without etiology for bleed 9/8 CRRT discontinued. Required ETT exchange yesterday due to blown cuff. 9/11  Bronchoscopic guided percutaneous tracheostomy placement.  Right IJ central line removed. 9/15 flexible sigmoidoscopy with removal of endoscopy 9/16 MRI brain w/o contrast amended study secondary to range of motion.  Rapid development numerous round white matter lesions in both cerebral hemispheres.  Prior MRIs. 9/17 MRI brain: Numerous contrast-enhancing bilateral white matter lesions, consistent with active demyelination. 9/18 Lumbar puncture  9/19 MRI cervical spine, thoracic : Limited exam, within limitations there is not definitive evidence of active demyelinization. Within limitations of motion artifact, there may be demyelinating lesions at the C6 and T1 vertebral body levels. 9/25-clinical exam not improved despite completion of 5-day IV steroids.  Neurology planning to repeat MRI of the brain cervical spine thoracic spine. Possible IR consult for catheter placement for possible Plex therapy if abnormal scans 9/26- scans attempted with ativan 9/29-patient transferred to ICU for respiratory distress due to aspiration pneumonia with sepsis 10/2-patient transferred back to [facility]; started having GI bleed, Dr. Mann/Dr. Benson Norway reconsulted.  Underwent sigmoidoscopy on 10/07/2021. 10/2: Plasma exchange started  Assessment & Plan:   Acute metabolic encephalopathy --MRI brain showed bilateral white matter lesions concerning for active demyelinating disease -S/p IV steroids for 5 days with no significant improvement -Currently undergoing plasma exchange as per neurology: Patient will be getting total of 5 sessions.  Follow further neurology recommendations.  Hemorrhagic shock/GI bleed -Patient has had multiple endoscopic procedures last month including EGD/capsule endoscopy/coil embolization of jejunal branch of SMA. -Possibly from stress ulceration/ischemic disease as per GI: Status post sigmoidoscopy on 10/07/2021 which showed single solitary ulcer in the sigmoid colon which was biopsied. -Has had  multiple packed red cell transfusion during this hospitalization. -Monitor H&H.  Hemoglobin 7.1 today.  Transfuse packed red cells if hemoglobin is less than 7.  Acute respiratory failure with  hypoxia status post tracheostomy Aspiration pneumonia -Status post tracheostomy placement on 09/15/2020.  PCCM following weekly. -Patient had acute aspiration event on 10/03/2021 and was placed on broad-spectrum antibiotics: Trach cultures grew MRSA and E. coli.  Treated with Rocephin and vancomycin and completed therapy.  E. coli cholecystitis status post percutaneous cholecystostomy drain placement -Had percutaneous cholecystostomy drain placement on 09/07/2021.  Subsequently had completed course of antibiotics -Will eventually need outpatient surgery/IR follow-up  Transaminitis -Resolved.  Thrombocytopenia -Monitor.  Diabetes mellitus type 2 with hyperglycemia -Continue long acting insulin along with CBGs with SSI  Acute kidney injury: Resolved -CRRT discontinued on 09/12/2021  Hypokalemia--improved  Hypophosphatemia -Improved  Hypertension -Continue monitoring blood pressure.  Currently on doxazosin   Obesity -Outpatient follow-up  DVT prophylaxis: SCDs Code Status: Full Family Communication: Wife at bedside  Disposition Plan: Status is: Inpatient Remains inpatient appropriate because: Of severity of illness    Consultants: GI/neurology/PCCM/general surgery/nephrology/IR  Procedures: As above  Antimicrobials:  Anti-infectives (From admission, onward)    Start     Dose/Rate Route Frequency Ordered Stop   10/06/21 1515  cefTRIAXone (ROCEPHIN) 2 g in sodium chloride 0.9 % 100 mL IVPB        2 g 200 mL/hr over 30 Minutes Intravenous Every 24 hours 10/06/21 1420 10/09/21 1756   10/04/21 0945  metroNIDAZOLE (FLAGYL) IVPB 500 mg  Status:  Discontinued        500 mg 100 mL/hr over 60 Minutes Intravenous Every 12 hours 10/04/21 0853 10/06/21 1329   10/03/21 2200  vancomycin  (VANCOREADY) IVPB 1500 mg/300 mL        1,500 mg 150 mL/hr over 120 Minutes Intravenous Every 12 hours 10/03/21 0613 10/10/21 0025   10/03/21 0700  vancomycin (VANCOREADY) IVPB 2000 mg/400 mL        2,000 mg 200 mL/hr over 120 Minutes Intravenous  Once 10/03/21 0613 10/03/21 1149   10/03/21 0700  ceFEPIme (MAXIPIME) 2 g in sodium chloride 0.9 % 100 mL IVPB  Status:  Discontinued        2 g 200 mL/hr over 30 Minutes Intravenous Every 8 hours 10/03/21 0613 10/06/21 1329   10/03/21 0645  vancomycin (VANCOREADY) IVPB 2000 mg/400 mL  Status:  Discontinued        2,000 mg 200 mL/hr over 120 Minutes Intravenous  Once 10/03/21 0549 10/03/21 0748   10/03/21 0645  ceFEPIme (MAXIPIME) 2 g in sodium chloride 0.9 % 100 mL IVPB  Status:  Discontinued        2 g 200 mL/hr over 30 Minutes Intravenous  Once 10/03/21 0549 10/03/21 1121   10/03/21 0600  vancomycin (VANCOREADY) IVPB 1500 mg/300 mL  Status:  Discontinued        1,500 mg 150 mL/hr over 120 Minutes Intravenous On call 10/02/21 1334 10/03/21 0549   09/11/21 1100  cefTRIAXone (ROCEPHIN) 2 g in sodium chloride 0.9 % 100 mL IVPB        2 g 200 mL/hr over 30 Minutes Intravenous Every 24 hours 09/11/21 1010 09/13/21 1108   09/08/21 0200  piperacillin-tazobactam (ZOSYN) IVPB 3.375 g  Status:  Discontinued        3.375 g 12.5 mL/hr over 240 Minutes Intravenous Every 8 hours 09/07/21 1856 09/07/21 2114   09/07/21 2200  meropenem (MERREM) 1 g in sodium chloride 0.9 % 100 mL IVPB  Status:  Discontinued        1 g 200 mL/hr over 30 Minutes Intravenous Every 8 hours 09/07/21 2114 09/11/21 1010  09/07/21 1945  piperacillin-tazobactam (ZOSYN) IVPB 3.375 g        3.375 g 100 mL/hr over 30 Minutes Intravenous  Once 09/07/21 1856 09/07/21 2005   09/07/21 1753  cefTRIAXone (ROCEPHIN) injection         Intravenous As needed 09/07/21 1754 09/07/21 1753   09/07/21 1749  sodium chloride 0.9 % with cefTRIAXone (ROCEPHIN) ADS Med       Note to Pharmacy: Eveline Keto E: cabinet override      09/07/21 1749 09/07/21 1935        Subjective: Patient seen and examined at bedside.  No fever, vomiting, agitation, seizures reported.   Objective: Vitals:   10/11/21 0313 10/11/21 0403 10/11/21 0623 10/11/21 0733  BP: 117/66 116/74 111/64 118/71  Pulse: (!) 104 (!) 104 (!) 108 (!) 106  Resp: 20 (!) 23 (!) 23 (!) 21  Temp:  98.8 F (37.1 C) 99 F (37.2 C) 98 F (36.7 C)  TempSrc:  Oral Axillary Axillary  SpO2: 100% 99% 99%   Weight:      Height:        Intake/Output Summary (Last 24 hours) at 10/11/2021 0811 Last data filed at 10/11/2021 0807 Gross per 24 hour  Intake 1860.01 ml  Output 2500 ml  Net -639.99 ml    Filed Weights   10/09/21 0348 10/10/21 0333 10/10/21 1048  Weight: 126.5 kg 120.9 kg 128.9 kg    Examination:  General: Appears to be in no distress chronically ill and deconditioned looking.  ENT/neck: On 5 L oxygen via trach respiratory: Decreased breath sounds at bases bilaterally with some crackles, tachypneic CVS: S1-S2 heard; tachycardic  abdominal: Soft, obese, nontender, still distended; no organomegaly, bowel sounds are heard Extremities: Lower extremity edema present bilaterally; no cyanosis  CNS: Awake. No use focal neurologic deficit.   Lymph: No obvious lymphadenopathy palpable  skin: No obvious lesions/ecchymosis psych: Could not be assessed because of current mental status musculoskeletal: No obvious other joint erythema/swelling    Data Reviewed: I have personally reviewed following labs and imaging studies  CBC: Recent Labs  Lab 10/07/21 0306 10/07/21 2317 10/08/21 0413 10/09/21 0334 10/10/21 0317 10/11/21 0304  WBC 7.4  --  8.3 8.1 9.5 6.0  NEUTROABS 5.7  --  6.4 7.0 7.3 4.6  HGB 7.2* 7.2* 7.6* 7.2* 7.3* 7.1*  HCT 22.5* 22.9* 24.0* 22.6* 23.1* 23.9*  MCV 89.3  --  89.2 90.8 91.3 94.5  PLT 129*  --  148* 131* 158 148*    Basic Metabolic Panel: Recent Labs  Lab 10/07/21 0306  10/08/21 0413 10/08/21 1610 10/09/21 0334 10/10/21 0317 10/11/21 0304  NA 140 137 139 139 137 139  K 3.4* 3.9 3.6 3.6 3.7 3.6  CL 113* 106 110 113* 109 110  CO2 21* 23 24 22 22 22   GLUCOSE 249* 326* 289* 302* 256* 216*  BUN 9 7 11 10 8 8   CREATININE 0.50* 0.53* 0.45* 0.50* 0.46* 0.58*  CALCIUM 7.8* 7.7* 7.8* 7.9* 8.0* 8.5*  MG 1.7 1.8  --  1.9 1.9 1.8  PHOS 1.8* 3.1  --  1.9* 3.1 2.9    GFR: Estimated Creatinine Clearance: 138 mL/min (A) (by C-G formula based on SCr of 0.58 mg/dL (L)). Liver Function Tests: Recent Labs  Lab 10/07/21 0306 10/08/21 0413 10/09/21 0334 10/10/21 0317 10/11/21 0304  AST 87* 136* 57* 40 26  ALT 81* 175* 81* 75* 34  ALKPHOS 145* 189* 69 71 37*  BILITOT 0.6 0.7 0.8 0.6 0.8  PROT 4.4* 4.5* 4.7* 4.6* 4.7*  ALBUMIN 3.3* 2.9* 3.7 3.3* 3.7    No results for input(s): "LIPASE", "AMYLASE" in the last 168 hours.  No results for input(s): "AMMONIA" in the last 168 hours. Coagulation Profile: No results for input(s): "INR", "PROTIME" in the last 168 hours.  Cardiac Enzymes: No results for input(s): "CKTOTAL", "CKMB", "CKMBINDEX", "TROPONINI" in the last 168 hours. BNP (last 3 results) No results for input(s): "PROBNP" in the last 8760 hours. HbA1C: No results for input(s): "HGBA1C" in the last 72 hours. CBG: Recent Labs  Lab 10/10/21 1933 10/10/21 2114 10/10/21 2358 10/11/21 0331 10/11/21 0759  GLUCAP 186* 203* 191* 216* 182*    Lipid Profile: No results for input(s): "CHOL", "HDL", "LDLCALC", "TRIG", "CHOLHDL", "LDLDIRECT" in the last 72 hours. Thyroid Function Tests: No results for input(s): "TSH", "T4TOTAL", "FREET4", "T3FREE", "THYROIDAB" in the last 72 hours. Anemia Panel: No results for input(s): "VITAMINB12", "FOLATE", "FERRITIN", "TIBC", "IRON", "RETICCTPCT" in the last 72 hours. Sepsis Labs: No results for input(s): "PROCALCITON", "LATICACIDVEN" in the last 168 hours.   Recent Results (from the past 240 hour(s))  Culture,  blood (x 2)     Status: None   Collection Time: 10/03/21  8:14 AM   Specimen: BLOOD LEFT HAND  Result Value Ref Range Status   Specimen Description BLOOD LEFT HAND IN PEDIATRIC BOTTLE  Final   Special Requests   Final    BOTTLES DRAWN AEROBIC ONLY Blood Culture adequate volume   Culture   Final    NO GROWTH 5 DAYS Performed at [facility], [address], [place], [place] [address]    Report Status 10/08/2021 FINAL  Final  Culture, blood (x 2)     Status: None   Collection Time: 10/03/21  8:16 AM   Specimen: BLOOD  Result Value Ref Range Status   Specimen Description BLOOD BLOOD RIGHT ARM  Final   Special Requests   Final    BOTTLES DRAWN AEROBIC AND ANAEROBIC Blood Culture adequate volume   Culture   Final    NO GROWTH 5 DAYS Performed at [facility], [place] [address] [place], [place] [address]    Report Status 10/08/2021 FINAL  Final  Culture, Respiratory w Gram Stain     Status: None   Collection Time: 10/03/21  6:15 PM   Specimen: Tracheal Aspirate; Respiratory  Result Value Ref Range Status   Specimen Description TRACHEAL ASPIRATE  Final   Special Requests NONE  Final   Gram Stain   Final    ABUNDANT WBC PRESENT, PREDOMINANTLY PMN FEW GRAM NEGATIVE RODS FEW GRAM POSITIVE COCCI IN PAIRS Performed at [facility], [address] [place],  [address]    Culture   Final    MODERATE METHICILLIN RESISTANT STAPHYLOCOCCUS AUREUS RARE ESCHERICHIA COLI    Report Status 10/06/2021 FINAL  Final   Organism ID, Bacteria ESCHERICHIA COLI  Final   Organism ID, Bacteria METHICILLIN RESISTANT STAPHYLOCOCCUS AUREUS  Final      Susceptibility   Escherichia coli - MIC*    AMPICILLIN 16 INTERMEDIATE Intermediate     CEFAZOLIN <=4 SENSITIVE Sensitive     CEFEPIME <=0.12 SENSITIVE Sensitive     CEFTAZIDIME <=1 SENSITIVE Sensitive     CEFTRIAXONE <=0.25 SENSITIVE Sensitive     CIPROFLOXACIN <=0.25 SENSITIVE Sensitive     GENTAMICIN <=1 SENSITIVE Sensitive      IMIPENEM <=0.25 SENSITIVE Sensitive     TRIMETH/SULFA <=20 SENSITIVE Sensitive  AMPICILLIN/SULBACTAM 4 SENSITIVE Sensitive     PIP/TAZO <=4 SENSITIVE Sensitive     * RARE ESCHERICHIA COLI   Methicillin resistant staphylococcus aureus - MIC*    CIPROFLOXACIN >=8 RESISTANT Resistant     ERYTHROMYCIN <=0.25 SENSITIVE Sensitive     GENTAMICIN <=0.5 SENSITIVE Sensitive     OXACILLIN >=4 RESISTANT Resistant     TETRACYCLINE <=1 SENSITIVE Sensitive     VANCOMYCIN 1 SENSITIVE Sensitive     TRIMETH/SULFA >=320 RESISTANT Resistant     CLINDAMYCIN <=0.25 SENSITIVE Sensitive     RIFAMPIN <=0.5 SENSITIVE Sensitive     Inducible Clindamycin NEGATIVE Sensitive     * MODERATE METHICILLIN RESISTANT STAPHYLOCOCCUS AUREUS         Radiology Studies: No results found.      Scheduled Meds:  Chlorhexidine Gluconate Cloth  6 each Topical Daily   doxazosin  2 mg Per Tube Daily   feeding supplement (PROSource TF20)  60 mL Per Tube BID   free water  250 mL Per Tube Q6H   influenza vac split quadrivalent PF  0.5 mL Intramuscular Tomorrow-1000   insulin aspart  0-15 Units Subcutaneous Q4H   insulin aspart  10 Units Subcutaneous Q4H   insulin glargine-yfgn  35 Units Subcutaneous BID   mouth rinse  15 mL Mouth Rinse 4 times per day   pantoprazole  40 mg Per Tube BID   pneumococcal 23 valent vaccine  0.5 mL Intramuscular Tomorrow-1000   rosuvastatin  40 mg Per Tube Daily   sodium chloride flush  10-40 mL Intracatheter Q12H   sodium chloride flush  5 mL Intracatheter Q8H   Zinc Oxide   Topical BID   Continuous Infusions:  sodium chloride 10 mL/hr at 10/10/21 1800   sodium chloride 250 mL (10/11/21 [id number])   sodium chloride Stopped (10/07/21 1454)   calcium gluconate     citrate dextrose     dextrose Stopped (10/04/21 1008)   dextrose     feeding supplement (VITAL 1.5 CAL) 1,000 mL (10/10/21 1811)   lactated ringers            Aline August, MD [facility] 10/11/2021, 8:11 AM

## 2021-10-11 NOTE — Plan of Care (Signed)
Continue PLEX with last two rounds  M and W. Moving RUE to command today. Will continue to follow.  -- Amie Portland, MD Neurologist [facility] Pager: [phone]

## 2021-10-11 NOTE — Plan of Care (Signed)
  Problem: Education: Goal: Knowledge of General Education information will improve Description: Including pain rating scale, medication(s)/side effects and non-pharmacologic comfort measures Outcome: Progressing   Problem: Health Behavior/Discharge Planning: Goal: Ability to manage health-related needs will improve Outcome: Progressing   Problem: Clinical Measurements: Goal: Ability to maintain clinical measurements within normal limits will improve Outcome: Progressing Goal: Diagnostic test results will improve Outcome: Progressing Goal: Respiratory complications will improve Outcome: Progressing Goal: Cardiovascular complication will be avoided Outcome: Progressing   Problem: Respiratory: Goal: Patent airway maintenance will improve Outcome: Progressing

## 2021-10-11 NOTE — Progress Notes (Signed)
Patient is Sinus tachycardic and had one episode of SVT at a rate of 170. Informed on-call

## 2021-10-12 ENCOUNTER — Encounter (HOSPITAL_COMMUNITY): Payer: Self-pay | Admitting: Gastroenterology

## 2021-10-12 DIAGNOSIS — G9341 Metabolic encephalopathy: Secondary | ICD-10-CM | POA: Diagnosis not present

## 2021-10-12 LAB — COMPREHENSIVE METABOLIC PANEL
ALT: 37 U/L (ref 0–44)
AST: 28 U/L (ref 15–41)
Albumin: 3.6 g/dL (ref 3.5–5.0)
Alkaline Phosphatase: 47 U/L (ref 38–126)
Anion gap: 9 (ref 5–15)
BUN: 6 mg/dL (ref 6–20)
CO2: 24 mmol/L (ref 22–32)
Calcium: 8.5 mg/dL — ABNORMAL LOW (ref 8.9–10.3)
Chloride: 103 mmol/L (ref 98–111)
Creatinine, Ser: 0.45 mg/dL — ABNORMAL LOW (ref 0.61–1.24)
GFR, Estimated: >60 mL/min (ref >60)
Glucose, Bld: 235 mg/dL — ABNORMAL HIGH (ref 70–99)
Potassium: 3.5 mmol/L (ref 3.5–5.1)
Sodium: 136 mmol/L (ref 135–145)
Total Bilirubin: 0.6 mg/dL (ref 0.3–1.2)
Total Protein: 4.8 g/dL — ABNORMAL LOW (ref 6.5–8.1)

## 2021-10-12 LAB — GLUCOSE, CAPILLARY
Glucose-Capillary: 174 mg/dL — ABNORMAL HIGH (ref 70–99)
Glucose-Capillary: 174 mg/dL — ABNORMAL HIGH (ref 70–99)
Glucose-Capillary: 219 mg/dL — ABNORMAL HIGH (ref 70–99)
Glucose-Capillary: 223 mg/dL — ABNORMAL HIGH (ref 70–99)
Glucose-Capillary: 233 mg/dL — ABNORMAL HIGH (ref 70–99)
Glucose-Capillary: 246 mg/dL — ABNORMAL HIGH (ref 70–99)

## 2021-10-12 LAB — CBC WITH DIFFERENTIAL/PLATELET
Abs Immature Granulocytes: 0.05 10*3/uL (ref 0.00–0.07)
Basophils Absolute: 0 10*3/uL (ref 0.0–0.1)
Basophils Relative: 0 %
Eosinophils Absolute: 0.2 10*3/uL (ref 0.0–0.5)
Eosinophils Relative: 3 %
HCT: 24.3 % — ABNORMAL LOW (ref 39.0–52.0)
Hemoglobin: 7.3 g/dL — ABNORMAL LOW (ref 13.0–17.0)
Immature Granulocytes: 1 %
Lymphocytes Relative: 15 %
Lymphs Abs: 0.8 10*3/uL (ref 0.7–4.0)
MCH: 28.3 pg (ref 26.0–34.0)
MCHC: 30 g/dL (ref 30.0–36.0)
MCV: 94.2 fL (ref 80.0–100.0)
Monocytes Absolute: 0.3 10*3/uL (ref 0.1–1.0)
Monocytes Relative: 6 %
Neutro Abs: 4.2 10*3/uL (ref 1.7–7.7)
Neutrophils Relative %: 75 %
Platelets: 176 10*3/uL (ref 150–400)
RBC: 2.58 MIL/uL — ABNORMAL LOW (ref 4.22–5.81)
RDW: 17.5 % — ABNORMAL HIGH (ref 11.5–15.5)
WBC: 5.6 10*3/uL (ref 4.0–10.5)
nRBC: 0 % (ref 0.0–0.2)

## 2021-10-12 LAB — MAGNESIUM: Magnesium: 1.9 mg/dL (ref 1.7–2.4)

## 2021-10-12 LAB — PHOSPHORUS: Phosphorus: 3.4 mg/dL (ref 2.5–4.6)

## 2021-10-12 MED ORDER — INSULIN GLARGINE-YFGN 100 UNIT/ML ~~LOC~~ SOLN
40.0000 [IU] | Freq: Two times a day (BID) | SUBCUTANEOUS | Status: DC
  Administered 2021-10-12 – 2021-10-18 (×13): 40 [IU] via SUBCUTANEOUS
  Filled 2021-10-12 (×16): qty 0.4

## 2021-10-12 NOTE — Progress Notes (Signed)
PROGRESS NOTE    Troy Randall  NWG:[id number] DOB: 10-27-1960 DOA: 09/04/2021 PCP: Benita Stabile, MD   Brief Narrative:  61 years old male with medical history of hypertension, hyperlipidemia, type 2 diabetes, CVA, presented to the hospital with GI bleed.  Patient underwent IR embolization followed by percutaneous cholecystostomy tube placement due to ongoing altered mental status.  MRI of the brain showed numerous contrast-enhancing bilateral white matter lesions concerning for demyelinating disease.  Neurology was consulted and underwent lumbar puncture on 09/22/2021 with low suspicion for infectious etiology.  Neurology has initiated the patient on high-dose steroids on 09/23/2021.  Patient was then transferred to hospitalist service on 09/24/2021.  Significant events. 8/31 presented to [facility] ED, [name] Admit, GI Consult 9/1 CTA> no evidence of active GI bleed, 2 U PRBC 9/2 2 U PRBC, colonoscopy > no active bleed found but large clots throughout entire colon. EGD> small hiatal hernia, erosive gastropathy with no stigmata of recent bleeding, and non-bleeding duodenal diverticulum.   Underwent then small bowel enteroscopy which noted intermittent blood pumping in the proximal jejunum but was unable to be reached despite multiple attempts, was injected and clip placed. Repeat CTA repeated, which showed positive jejunal diverticular bleeding. IR> active extrav at [facility] territory, jejunal arcade branch, successful coil embo 9/3 Intubated. CTA> no evidence of active GI bleeding.  New finding of abnormal gallbladder with nondependent air, gallbladder wall air, pericholecystic and right upper quadrant information.  IR placed percutaneous cholecystotomy tube.  9/4 R. IJ central line placed with dialysis triple lumen catheter 9/5 CRRT initiated 9/6 Bedside EGD without any evidence of bleeding. CTA a/p without etiology for bleed 9/8 CRRT discontinued. Required ETT exchange yesterday due to blown cuff. 9/11  Bronchoscopic guided percutaneous tracheostomy placement.  Right IJ central line removed. 9/15 flexible sigmoidoscopy with removal of endoscopy 9/16 MRI brain w/o contrast amended study secondary to range of motion.  Rapid development numerous round white matter lesions in both cerebral hemispheres.  Prior MRIs. 9/17 MRI brain: Numerous contrast-enhancing bilateral white matter lesions, consistent with active demyelination. 9/18 Lumbar puncture  9/19 MRI cervical spine, thoracic : Limited exam, within limitations there is not definitive evidence of active demyelinization. Within limitations of motion artifact, there may be demyelinating lesions at the C6 and T1 vertebral body levels. 9/25-clinical exam not improved despite completion of 5-day IV steroids.  Neurology planning to repeat MRI of the brain cervical spine thoracic spine. Possible IR consult for catheter placement for possible Plex therapy if abnormal scans 9/26- scans attempted with ativan 9/29-patient transferred to ICU for respiratory distress due to aspiration pneumonia with sepsis 10/2-patient transferred back to [facility]; started having GI bleed, Dr. Mann/Dr. Elnoria Howard reconsulted.  Underwent sigmoidoscopy on 10/07/2021. 10/2: Plasma exchange started  Assessment & Plan:   Acute metabolic encephalopathy --MRI brain showed bilateral white matter lesions concerning for active demyelinating disease -S/p IV steroids for 5 days with no significant improvement -Currently undergoing plasma exchange as per neurology: Patient will be getting total of 5 sessions.  Follow further neurology recommendations.  Hemorrhagic shock/GI bleed -Patient has had multiple endoscopic procedures last month including EGD/capsule endoscopy/coil embolization of jejunal branch of SMA. -Possibly from stress ulceration/ischemic disease as per GI: Status post sigmoidoscopy on 10/07/2021 which showed single solitary ulcer in the sigmoid colon which was biopsied. -Has had  multiple packed red cell transfusion during this hospitalization. -Monitor H&H.  Hemoglobin 7.3 today.  Transfuse packed red cells if hemoglobin is less than 7.  Acute respiratory failure with  hypoxia status post tracheostomy Aspiration pneumonia -Status post tracheostomy placement on 09/15/2020.  PCCM following weekly. -Patient had acute aspiration event on 10/03/2021 and was placed on broad-spectrum antibiotics: Trach cultures grew MRSA and E. coli.  Treated with Rocephin and vancomycin and completed therapy.  E. coli cholecystitis status post percutaneous cholecystostomy drain placement -Had percutaneous cholecystostomy drain placement on 09/07/2021.  Subsequently had completed course of antibiotics -Will eventually need outpatient surgery/IR follow-up  Transaminitis -Resolved.  Thrombocytopenia -Monitor.  Diabetes mellitus type 2 with hyperglycemia -Continue long acting insulin along with CBGs with SSI  Acute kidney injury: Resolved -CRRT discontinued on 09/12/2021  Hypokalemia--improved  Hypophosphatemia -Improved  Hypertension -Continue monitoring blood pressure.  Currently on doxazosin   Obesity -Outpatient follow-up  DVT prophylaxis: SCDs Code Status: Full Family Communication: Wife and brother at bedside  Disposition Plan: Status is: Inpatient Remains inpatient appropriate because: Of severity of illness    Consultants: GI/neurology/PCCM/general surgery/nephrology/IR  Procedures: As above  Antimicrobials:  Anti-infectives (From admission, onward)    Start     Dose/Rate Route Frequency Ordered Stop   10/06/21 1515  cefTRIAXone (ROCEPHIN) 2 g in sodium chloride 0.9 % 100 mL IVPB        2 g 200 mL/hr over 30 Minutes Intravenous Every 24 hours 10/06/21 1420 10/09/21 1756   10/04/21 0945  metroNIDAZOLE (FLAGYL) IVPB 500 mg  Status:  Discontinued        500 mg 100 mL/hr over 60 Minutes Intravenous Every 12 hours 10/04/21 0853 10/06/21 1329   10/03/21 2200   vancomycin (VANCOREADY) IVPB 1500 mg/300 mL        1,500 mg 150 mL/hr over 120 Minutes Intravenous Every 12 hours 10/03/21 0613 10/10/21 0025   10/03/21 0700  vancomycin (VANCOREADY) IVPB 2000 mg/400 mL        2,000 mg 200 mL/hr over 120 Minutes Intravenous  Once 10/03/21 0613 10/03/21 1149   10/03/21 0700  ceFEPIme (MAXIPIME) 2 g in sodium chloride 0.9 % 100 mL IVPB  Status:  Discontinued        2 g 200 mL/hr over 30 Minutes Intravenous Every 8 hours 10/03/21 0613 10/06/21 1329   10/03/21 0645  vancomycin (VANCOREADY) IVPB 2000 mg/400 mL  Status:  Discontinued        2,000 mg 200 mL/hr over 120 Minutes Intravenous  Once 10/03/21 0549 10/03/21 0748   10/03/21 0645  ceFEPIme (MAXIPIME) 2 g in sodium chloride 0.9 % 100 mL IVPB  Status:  Discontinued        2 g 200 mL/hr over 30 Minutes Intravenous  Once 10/03/21 0549 10/03/21 1121   10/03/21 0600  vancomycin (VANCOREADY) IVPB 1500 mg/300 mL  Status:  Discontinued        1,500 mg 150 mL/hr over 120 Minutes Intravenous On call 10/02/21 1334 10/03/21 0549   09/11/21 1100  cefTRIAXone (ROCEPHIN) 2 g in sodium chloride 0.9 % 100 mL IVPB        2 g 200 mL/hr over 30 Minutes Intravenous Every 24 hours 09/11/21 1010 09/13/21 1108   09/08/21 0200  piperacillin-tazobactam (ZOSYN) IVPB 3.375 g  Status:  Discontinued        3.375 g 12.5 mL/hr over 240 Minutes Intravenous Every 8 hours 09/07/21 1856 09/07/21 2114   09/07/21 2200  meropenem (MERREM) 1 g in sodium chloride 0.9 % 100 mL IVPB  Status:  Discontinued        1 g 200 mL/hr over 30 Minutes Intravenous Every 8 hours 09/07/21 2114 09/11/21 1010  09/07/21 1945  piperacillin-tazobactam (ZOSYN) IVPB 3.375 g        3.375 g 100 mL/hr over 30 Minutes Intravenous  Once 09/07/21 1856 09/07/21 2005   09/07/21 1753  cefTRIAXone (ROCEPHIN) injection         Intravenous As needed 09/07/21 1754 09/07/21 1753   09/07/21 1749  sodium chloride 0.9 % with cefTRIAXone (ROCEPHIN) ADS Med       Note to  Pharmacy: Eveline Keto E: cabinet override      09/07/21 1749 09/07/21 1935        Subjective: Patient seen and examined at bedside.  No agitation, seizures, vomiting or rectal bleeding noted.   Objective: Vitals:   10/12/21 0500 10/12/21 0736 10/12/21 0747 10/12/21 0928  BP: 131/76   126/72  Pulse: (!) 103  (!) 109 100  Resp: 20 20 (!) 22 (!) 22  Temp: 98.5 F (36.9 C)   98.7 F (37.1 C)  TempSrc: Axillary   Oral  SpO2:   98%   Weight:      Height:        Intake/Output Summary (Last 24 hours) at 10/12/2021 0955 Last data filed at 10/12/2021 0602 Gross per 24 hour  Intake 5 ml  Output 2385 ml  Net -2380 ml    Filed Weights   10/09/21 0348 10/10/21 0333 10/10/21 1048  Weight: 126.5 kg 120.9 kg 128.9 kg    Examination:  General: No acute distress currently.  Chronically ill and deconditioned looking.  ENT/neck: Still on 5 L oxygen via tracheostomy  respiratory: Bilateral decreased breath sounds at bases with scattered crackles and intermittent tachypnea present CVS: Still tachycardic; S1-S2 heard  abdominal: Soft, obese, nontender, still has some mild distention; no organomegaly, bowel sounds are normal  extremities: No clubbing; mild lower extremity edema present bilaterally  CNS: Awake; moving right upper and lower extremities.   lymph: No cervical lymphadenopathy noted  skin: No obvious rashes/petechiae  psych: Showing no signs of agitation.  Mostly flat affect. musculoskeletal: No obvious other joint tenderness/erythema   Data Reviewed: I have personally reviewed following labs and imaging studies  CBC: Recent Labs  Lab 10/08/21 0413 10/09/21 0334 10/10/21 0317 10/11/21 0304 10/12/21 0312  WBC 8.3 8.1 9.5 6.0 5.6  NEUTROABS 6.4 7.0 7.3 4.6 4.2  HGB 7.6* 7.2* 7.3* 7.1* 7.3*  HCT 24.0* 22.6* 23.1* 23.9* 24.3*  MCV 89.2 90.8 91.3 94.5 94.2  PLT 148* 131* 158 148* 062    Basic Metabolic Panel: Recent Labs  Lab 10/08/21 0413 10/08/21 1610  10/09/21 0334 10/10/21 0317 10/11/21 0304 10/12/21 0312  NA 137 139 139 137 139 136  K 3.9 3.6 3.6 3.7 3.6 3.5  CL 106 110 113* 109 110 103  CO2 23 24 22 22 22 24   GLUCOSE 326* 289* 302* 256* 216* 235*  BUN 7 11 10 8 8 6   CREATININE 0.53* 0.45* 0.50* 0.46* 0.58* 0.45*  CALCIUM 7.7* 7.8* 7.9* 8.0* 8.5* 8.5*  MG 1.8  --  1.9 1.9 1.8 1.9  PHOS 3.1  --  1.9* 3.1 2.9 3.4    GFR: Estimated Creatinine Clearance: 138 mL/min (A) (by C-G formula based on SCr of 0.45 mg/dL (L)). Liver Function Tests: Recent Labs  Lab 10/08/21 0413 10/09/21 0334 10/10/21 0317 10/11/21 0304 10/12/21 0312  AST 136* 57* 40 26 28  ALT 175* 81* 75* 34 37  ALKPHOS 189* 69 71 37* 47  BILITOT 0.7 0.8 0.6 0.8 0.6  PROT 4.5* 4.7* 4.6* 4.7* 4.8*  ALBUMIN  2.9* 3.7 3.3* 3.7 3.6    No results for input(s): "LIPASE", "AMYLASE" in the last 168 hours.  No results for input(s): "AMMONIA" in the last 168 hours. Coagulation Profile: No results for input(s): "INR", "PROTIME" in the last 168 hours.  Cardiac Enzymes: No results for input(s): "CKTOTAL", "CKMB", "CKMBINDEX", "TROPONINI" in the last 168 hours. BNP (last 3 results) No results for input(s): "PROBNP" in the last 8760 hours. HbA1C: No results for input(s): "HGBA1C" in the last 72 hours. CBG: Recent Labs  Lab 10/11/21 1727 10/11/21 2038 10/12/21 0037 10/12/21 0440 10/12/21 0921  GLUCAP 207* 203* 223* 233* 246*    Lipid Profile: No results for input(s): "CHOL", "HDL", "LDLCALC", "TRIG", "CHOLHDL", "LDLDIRECT" in the last 72 hours. Thyroid Function Tests: No results for input(s): "TSH", "T4TOTAL", "FREET4", "T3FREE", "THYROIDAB" in the last 72 hours. Anemia Panel: No results for input(s): "VITAMINB12", "FOLATE", "FERRITIN", "TIBC", "IRON", "RETICCTPCT" in the last 72 hours. Sepsis Labs: No results for input(s): "PROCALCITON", "LATICACIDVEN" in the last 168 hours.   Recent Results (from the past 240 hour(s))  Culture, blood (x 2)     Status:  None   Collection Time: 10/03/21  8:14 AM   Specimen: BLOOD LEFT HAND  Result Value Ref Range Status   Specimen Description BLOOD LEFT HAND IN PEDIATRIC BOTTLE  Final   Special Requests   Final    BOTTLES DRAWN AEROBIC ONLY Blood Culture adequate volume   Culture   Final    NO GROWTH 5 DAYS Performed at [facility], [address], [place], [place] [address]    Report Status 10/08/2021 FINAL  Final  Culture, blood (x 2)     Status: None   Collection Time: 10/03/21  8:16 AM   Specimen: BLOOD  Result Value Ref Range Status   Specimen Description BLOOD BLOOD RIGHT ARM  Final   Special Requests   Final    BOTTLES DRAWN AEROBIC AND ANAEROBIC Blood Culture adequate volume   Culture   Final    NO GROWTH 5 DAYS Performed at [facility], [address], [place], [place] [address]    Report Status 10/08/2021 FINAL  Final  Culture, Respiratory w Gram Stain     Status: None   Collection Time: 10/03/21  6:15 PM   Specimen: Tracheal Aspirate; Respiratory  Result Value Ref Range Status   Specimen Description TRACHEAL ASPIRATE  Final   Special Requests NONE  Final   Gram Stain   Final    ABUNDANT WBC PRESENT, PREDOMINANTLY PMN FEW GRAM NEGATIVE RODS FEW GRAM POSITIVE COCCI IN PAIRS Performed at [facility], [address], [place], [place] [address]    Culture   Final    MODERATE METHICILLIN RESISTANT STAPHYLOCOCCUS AUREUS RARE ESCHERICHIA COLI    Report Status 10/06/2021 FINAL  Final   Organism ID, Bacteria ESCHERICHIA COLI  Final   Organism ID, Bacteria METHICILLIN RESISTANT STAPHYLOCOCCUS AUREUS  Final      Susceptibility   Escherichia coli - MIC*    AMPICILLIN 16 INTERMEDIATE Intermediate     CEFAZOLIN <=4 SENSITIVE Sensitive     CEFEPIME <=0.12 SENSITIVE Sensitive     CEFTAZIDIME <=1 SENSITIVE Sensitive     CEFTRIAXONE <=0.25 SENSITIVE Sensitive     CIPROFLOXACIN <=0.25 SENSITIVE Sensitive     GENTAMICIN <=1 SENSITIVE Sensitive     IMIPENEM <=0.25  SENSITIVE Sensitive     TRIMETH/SULFA <=20 SENSITIVE Sensitive     AMPICILLIN/SULBACTAM 4 SENSITIVE Sensitive  PIP/TAZO <=4 SENSITIVE Sensitive     * RARE ESCHERICHIA COLI   Methicillin resistant staphylococcus aureus - MIC*    CIPROFLOXACIN >=8 RESISTANT Resistant     ERYTHROMYCIN <=0.25 SENSITIVE Sensitive     GENTAMICIN <=0.5 SENSITIVE Sensitive     OXACILLIN >=4 RESISTANT Resistant     TETRACYCLINE <=1 SENSITIVE Sensitive     VANCOMYCIN 1 SENSITIVE Sensitive     TRIMETH/SULFA >=320 RESISTANT Resistant     CLINDAMYCIN <=0.25 SENSITIVE Sensitive     RIFAMPIN <=0.5 SENSITIVE Sensitive     Inducible Clindamycin NEGATIVE Sensitive     * MODERATE METHICILLIN RESISTANT STAPHYLOCOCCUS AUREUS         Radiology Studies: No results found.      Scheduled Meds:  Chlorhexidine Gluconate Cloth  6 each Topical Daily   doxazosin  2 mg Per Tube Daily   feeding supplement (PROSource TF20)  60 mL Per Tube BID   free water  250 mL Per Tube Q6H   influenza vac split quadrivalent PF  0.5 mL Intramuscular Tomorrow-1000   insulin aspart  0-15 Units Subcutaneous Q4H   insulin aspart  10 Units Subcutaneous Q4H   insulin glargine-yfgn  35 Units Subcutaneous BID   mouth rinse  15 mL Mouth Rinse 4 times per day   pantoprazole  40 mg Per Tube BID   pneumococcal 23 valent vaccine  0.5 mL Intramuscular Tomorrow-1000   rosuvastatin  40 mg Per Tube Daily   sodium chloride flush  10-40 mL Intracatheter Q12H   sodium chloride flush  5 mL Intracatheter Q8H   Zinc Oxide   Topical BID   Continuous Infusions:  sodium chloride 10 mL/hr at 10/10/21 1800   sodium chloride 250 mL (10/11/21 0300)   sodium chloride Stopped (10/07/21 1454)   calcium gluconate     citrate dextrose     dextrose Stopped (10/04/21 1008)   dextrose     feeding supplement (VITAL 1.5 CAL) 1,000 mL (10/10/21 1811)   lactated ringers            Glade Lloyd, MD [facility] 10/12/2021, 9:55 AM

## 2021-10-12 NOTE — Progress Notes (Signed)
Neurology Progress Note  Subjective: Seen and examined Over the past few days, has received a total of 3 plasma exchanges. Has been awake, tracking the examiner, according to wife, follows more commands on the right for her.  Has been weaker on the left.  Objective: Current vital signs: BP 126/72 (BP Location: Left Arm)   Pulse (!) 109   Temp 98.7 F (37.1 C) (Oral)   Resp (!) 22   Ht 6' (1.829 m)   Wt 128.9 kg   SpO2 100%   BMI 38.54 kg/m  Vital signs in last 24 hours: Temp:  [98 F (36.7 C)-98.7 F (37.1 C)] 98.7 F (37.1 C) (10/08 0928) Pulse Rate:  [100-110] 109 (10/08 1104) Resp:  [17-26] 22 (10/08 1104) BP: (116-131)/(64-76) 126/72 (10/08 0928) SpO2:  [98 %-100 %] 100 % (10/08 1104) FiO2 (%):  [28 %] 28 % (10/08 1104)  Intake/Output from previous day: 10/07 0701 - 10/08 0700 In: 5  Out: 2585 [Urine:2575; Drains:10] Intake/Output this shift: Total I/O In: -  Out: 400 [Urine:400] Nutritional status:  Diet Order             Diet NPO time specified  Diet effective now                  HEENT: [place]/AT. Trach collar is noted with nonbloody secretions Ext: No edema Neurological exam He is awake, alert, able to track the examiner Intermittently follows commands on the right-sometimes able to give thumbs up the other times he decides not to.  Does not show 2 fingers to command.  Does not give thumbs up on the left.  According to wife and witnessed by nurses, he is able to snap his right hand fingers to command sometimes when the wife asks. Cranial nerves: Pupils are round and reactive to light, equal in size, blinks to threat from both sides, extraocular movements appear unhindered but prefers the right side, face appears grossly symmetric. Motor examination with antigravity strength in the right upper and lower extremity and barely antigravity strength in the left upper extremity and minimal movement to no movement to noxious stimulation of the left lower  extremity. Sensory exam: As above Coordination difficult to assess  Lab Results Reviewed: CBC    Component Value Date/Time   WBC 5.6 10/12/2021 0312   RBC 2.58 (L) 10/12/2021 0312   HGB 7.3 (L) 10/12/2021 0312   HCT 24.3 (L) 10/12/2021 0312   PLT 176 10/12/2021 0312   MCV 94.2 10/12/2021 0312   MCH 28.3 10/12/2021 0312   MCHC 30.0 10/12/2021 0312   RDW 17.5 (H) 10/12/2021 0312   LYMPHSABS 0.8 10/12/2021 0312   MONOABS 0.3 10/12/2021 0312   EOSABS 0.2 10/12/2021 0312   BASOSABS 0.0 10/12/2021 0312      Latest Ref Rng & Units 10/12/2021    3:12 AM 10/11/2021    3:04 AM 10/10/2021    3:17 AM  BMP  Glucose 70 - 99 mg/dL 403  474  259   BUN 6 - 20 mg/dL 6  8  8    Creatinine 0.61 - 1.24 mg/dL  5.63  8.75   Sodium 135 - 145 mmol/L 136  139  137   Potassium 3.5 - 5.1 mmol/L 3.5  3.6  3.7   Chloride 98 - 111 mmol/L 103  110  109   CO2 22 - 32 mmol/L 24  22  22    Calcium 8.9 - 10.3 mg/dL 8.5  8.5  8.0      -  LP was performed by CCM on 9/18. CSF findings: - Glucose 126, RBC count 1-5 (unremarkable), clear, colorless, total protein normal, WBC normal.  - Gram stain: Negative - Fungal and bacterial cultures of CSF negative thus far - Infectious disease [employer] panel negative for all pathogens tested, including cryptococcus, VZV and HSV.  - Cryptococcal Ag negative.  - IgG index is normal at 0.5.  - OCBs testing has resulted: No oligoclonal bands seen but he does have matched bands in both serum and CSF. - Serum labs:  - Vitamin B12 level consistent with supplementation. Mildly elevated Na. BUN 30. ALT 48. TSH normal. ANA negative.  - C3 complement level elevated at 184. Autoimmune panel otherwise negative.  - HIV screen negative.   Medications: Scheduled:  Chlorhexidine Gluconate Cloth  6 each Topical Daily   doxazosin  2 mg Per Tube Daily   feeding supplement (PROSource TF20)  60 mL Per Tube BID   free water  250 mL Per Tube Q6H   influenza vac split quadrivalent PF   0.5 mL Intramuscular Tomorrow-1000   insulin aspart  0-15 Units Subcutaneous Q4H   insulin aspart  10 Units Subcutaneous Q4H   insulin glargine-yfgn  40 Units Subcutaneous BID   mouth rinse  15 mL Mouth Rinse 4 times per day   pantoprazole  40 mg Per Tube BID   pneumococcal 23 valent vaccine  0.5 mL Intramuscular Tomorrow-1000   rosuvastatin  40 mg Per Tube Daily   sodium chloride flush  10-40 mL Intracatheter Q12H   sodium chloride flush  5 mL Intracatheter Q8H   Zinc Oxide   Topical BID   Continuous:  sodium chloride 10 mL/hr at 10/10/21 1800   sodium chloride 250 mL (10/11/21 4540)   sodium chloride Stopped (10/07/21 1454)   calcium gluconate     citrate dextrose     dextrose Stopped (10/04/21 1008)   dextrose     feeding supplement (VITAL 1.5 CAL) 1,000 mL (10/10/21 1811)   lactated ringers     Assessment: 61 year old patient with history of stroke in April of this year with minimal residual deficits and imaging concerning in hindsight of FLAIR/T2 hyperintense lesions-noncontrasted studies unclear whether there was any enhancement, HTN, HLD, DM and recent jejunal bleed, presented with continued AMS and concerning multifocal enhancing white matter lesions on MRI that exhibit morphologies and a distribution suggestive of demyelination such as ADEM (acute disseminated encephalomyelitis) He has no prior history of demyelinating disease but review of noncontrasted MRI shows the FLAIR/T2 hyperintensities as above. Spine imaging negative for cord lesions. Has completed a 5 day course of IV Solumedrol as empiric treatment for the lesions without improvement in presentation. Plan for PLEX therapy starting 10/2.  Completed 3 rounds of plasma exchange with the fourth and fifth plan for Monday and Wednesday.  Essentially being treated for ADEM versus inflammatory demyelination  Exam somewhat improved in terms of his mentation, wakefulness, tracking and intermittent following commands on the  right but is definitely weaker on the left and not close to his baseline prior to hospital arrival.   Recommendations: -Continue total of 5 plasma exchanges -Supportive medical management per primary teams as you are Neurology will continue to follow with you.  Was discussed with son at bedside and relayed to the hospitalist Dr. Starla Link -- Amie Portland, MD Neurologist [facility] Pager: [phone]  Plan d/w Dr. Darrick Meigs

## 2021-10-12 NOTE — Progress Notes (Signed)
Neurology Progress Note  Subjective: Seen and examined Over the past few days, he has been awake, tracking the examiner, according to wife, follows more commands on the right for her.  Has been weaker on the left. Will not follow commands this morning but is moving all four extremities.  PLEX treatment 4/5 scheduled for today  Objective: Current vital signs: BP 122/66   Pulse (!) 109   Temp 98.6 F (37 C)   Resp 19   Ht 6' (1.829 m)   Wt 128.9 kg   SpO2 100%   BMI 38.54 kg/m  Vital signs in last 24 hours: Temp:  [98.5 F (36.9 C)-98.7 F (37.1 C)] 98.6 F (37 C) (10/08 1300) Pulse Rate:  [99-109] 109 (10/08 1510) Resp:  [18-22] 19 (10/08 1510) BP: (122-131)/(66-76) 122/66 (10/08 1300) SpO2:  [98 %-100 %] 100 % (10/08 1510) FiO2 (%):  [28 %] 28 % (10/08 1510)  Intake/Output from previous day: 10/07 0701 - 10/08 0700 In: 5  Out: 2585 [Urine:2575; Drains:10] Intake/Output this shift: No intake/output data recorded. Nutritional status:  Diet Order             Diet NPO time specified  Diet effective now                  HEENT: [place]/AT. Trach collar is noted with nonbloody secretions Ext: No edema Neurological exam He is awake, alert, able to track the examiner.  Patient is nonverbal but wife reports that he has been able to speak with a Passy-Muir valve Cranial nerves: PERRL, EOMI, face symmetrical.   Will not follow commands for examiner or wife today but will regard examiner and track around the room.  According to wife, he has been intermittently following commands and waving goodbye to staff. Motor examination:  Will move BUE with good antigravity strength (4- bilaterally) appears equal on left and right.  Will keep arms up if they are raised by examiner.  Will spontaneously raise RLE off the bed but will not raise LLE, able to bend left knee and keep left leg in bent position Unable to elicit reflexes  Lab Results Reviewed: CBC    Component Value Date/Time    WBC 5.6 10/12/2021 0312   RBC 2.58 (L) 10/12/2021 0312   HGB 7.3 (L) 10/12/2021 0312   HCT 24.3 (L) 10/12/2021 0312   PLT 176 10/12/2021 0312   MCV 94.2 10/12/2021 0312   MCH 28.3 10/12/2021 0312   MCHC 30.0 10/12/2021 0312   RDW 17.5 (H) 10/12/2021 0312   LYMPHSABS 0.8 10/12/2021 0312   MONOABS 0.3 10/12/2021 0312   EOSABS 0.2 10/12/2021 0312   BASOSABS 0.0 10/12/2021 0312      Latest Ref Rng & Units 10/12/2021    3:12 AM 10/11/2021    3:04 AM 10/10/2021    3:17 AM  BMP  Glucose 70 - 99 mg/dL 235  216  256   BUN 6 - 20 mg/dL 6  8  8    Creatinine 0.61 - 1.24 mg/dL 0.45  0.58  0.46   Sodium 135 - 145 mmol/L 136  139  137   Potassium 3.5 - 5.1 mmol/L 3.5  3.6  3.7   Chloride 98 - 111 mmol/L 103  110  109   CO2 22 - 32 mmol/L 24  22  22    Calcium 8.9 - 10.3 mg/dL 8.5  8.5  8.0      - LP was performed by CCM on 9/18. CSF findings: - Glucose  126, RBC count 1-5 (unremarkable), clear, colorless, total protein normal, WBC normal.  - Gram stain: Negative - Fungal and bacterial cultures of CSF negative thus far - Infectious disease [employer] panel negative for all pathogens tested, including cryptococcus, VZV and HSV.  - Cryptococcal Ag negative.  - IgG index is normal at 0.5.  - OCBs testing has resulted: No oligoclonal bands seen but he does have matched bands in both serum and CSF. - Serum labs:  - Vitamin B12 level consistent with supplementation. Mildly elevated Na. BUN 30. ALT 48. TSH normal. ANA negative.  - C3 complement level elevated at 184. Autoimmune panel otherwise negative.  - HIV screen negative.  - Serum MOG (10/07/21) negative  Medications: Scheduled:  Chlorhexidine Gluconate Cloth  6 each Topical Daily   doxazosin  2 mg Per Tube Daily   feeding supplement (PROSource TF20)  60 mL Per Tube BID   free water  250 mL Per Tube Q6H   influenza vac split quadrivalent PF  0.5 mL Intramuscular Tomorrow-1000   insulin aspart  0-15 Units Subcutaneous Q4H   insulin aspart  10  Units Subcutaneous Q4H   insulin glargine-yfgn  40 Units Subcutaneous BID   mouth rinse  15 mL Mouth Rinse 4 times per day   pantoprazole  40 mg Per Tube BID   pneumococcal 23 valent vaccine  0.5 mL Intramuscular Tomorrow-1000   rosuvastatin  40 mg Per Tube Daily   sodium chloride flush  10-40 mL Intracatheter Q12H   sodium chloride flush  5 mL Intracatheter Q8H   Zinc Oxide   Topical BID   Continuous:  sodium chloride 10 mL/hr at 10/10/21 1800   sodium chloride 250 mL (10/11/21 1448)   sodium chloride Stopped (10/07/21 1454)   calcium gluconate     citrate dextrose     dextrose Stopped (10/04/21 1008)   dextrose     feeding supplement (VITAL 1.5 CAL) 1,000 mL (10/10/21 1811)   lactated ringers     Assessment: 61 year old patient with history of stroke in April of this year with minimal residual deficits and imaging concerning in hindsight of FLAIR/T2 hyperintense lesions-noncontrasted studies unclear whether there was any enhancement, HTN, HLD, DM and recent jejunal bleed, presented with continued AMS and concerning multifocal enhancing white matter lesions on MRI that exhibit morphologies and a distribution suggestive of demyelination such as ADEM (acute disseminated encephalomyelitis) He has no prior history of demyelinating disease but review of noncontrasted MRI shows the FLAIR/T2 hyperintensities as above. Spine imaging negative for cord lesions (though fairly motion limited on two attempts, 9/19 and 9/29). Has completed a 5 day course of IV Solumedrol as empiric treatment for the lesions without improvement in presentation. Plan for PLEX therapy starting 10/2.  Completed 3 rounds of plasma exchange with the fourth and fifth plan for today and Wednesday.  Essentially being treated for ADEM versus inflammatory demyelination  Exam somewhat worse today with patient not following commands but moving RUE and RLE spontaneously with antigravity strength   Recommendations: -Continue total  of 5 plasma exchanges (10/07/2021, 10/08/2021, 10/10/2021 completed) Fourth and fifth rounds on Monday, 10/13/2021 and Wednesday, 10/15/2021. -Will try to add on NMO to CSF if sample available (atypical for NMO but for completeness and to guide management)  -JCV, Hepatitis panel, Tuberculosis screening for safety labs to guide possible long term immunosuppression on an outpatient basis -Supportive medical management per primary teams as you are Neurology will continue to follow with you.  Was discussed with  wife at bedside and relayed to the hospitalist Dr. Starla Link *** -- .Terence Lux

## 2021-10-13 DIAGNOSIS — D62 Acute posthemorrhagic anemia: Secondary | ICD-10-CM | POA: Diagnosis not present

## 2021-10-13 DIAGNOSIS — R578 Other shock: Secondary | ICD-10-CM | POA: Diagnosis not present

## 2021-10-13 DIAGNOSIS — K810 Acute cholecystitis: Secondary | ICD-10-CM | POA: Diagnosis not present

## 2021-10-13 DIAGNOSIS — Z8719 Personal history of other diseases of the digestive system: Secondary | ICD-10-CM | POA: Diagnosis not present

## 2021-10-13 DIAGNOSIS — G9341 Metabolic encephalopathy: Secondary | ICD-10-CM | POA: Diagnosis not present

## 2021-10-13 LAB — CBC WITH DIFFERENTIAL/PLATELET
Abs Immature Granulocytes: 0.04 10*3/uL (ref 0.00–0.07)
Basophils Absolute: 0 10*3/uL (ref 0.0–0.1)
Basophils Relative: 0 %
Eosinophils Absolute: 0.2 10*3/uL (ref 0.0–0.5)
Eosinophils Relative: 3 %
HCT: 24.5 % — ABNORMAL LOW (ref 39.0–52.0)
Hemoglobin: 7.3 g/dL — ABNORMAL LOW (ref 13.0–17.0)
Immature Granulocytes: 1 %
Lymphocytes Relative: 16 %
Lymphs Abs: 0.9 10*3/uL (ref 0.7–4.0)
MCH: 28.2 pg (ref 26.0–34.0)
MCHC: 29.8 g/dL — ABNORMAL LOW (ref 30.0–36.0)
MCV: 94.6 fL (ref 80.0–100.0)
Monocytes Absolute: 0.3 10*3/uL (ref 0.1–1.0)
Monocytes Relative: 5 %
Neutro Abs: 4.4 10*3/uL (ref 1.7–7.7)
Neutrophils Relative %: 75 %
Platelets: 197 10*3/uL (ref 150–400)
RBC: 2.59 MIL/uL — ABNORMAL LOW (ref 4.22–5.81)
RDW: 17.6 % — ABNORMAL HIGH (ref 11.5–15.5)
WBC: 5.9 10*3/uL (ref 4.0–10.5)
nRBC: 0 % (ref 0.0–0.2)

## 2021-10-13 LAB — CULTURE, FUNGUS WITHOUT SMEAR

## 2021-10-13 LAB — BASIC METABOLIC PANEL
Anion gap: 9 (ref 5–15)
Anion gap: 6 (ref 5–15)
BUN: 8 mg/dL (ref 6–20)
BUN: 10 mg/dL (ref 6–20)
CO2: 27 mmol/L (ref 22–32)
CO2: 28 mmol/L (ref 22–32)
Calcium: 9 mg/dL (ref 8.9–10.3)
Calcium: 8.5 mg/dL — ABNORMAL LOW (ref 8.9–10.3)
Chloride: 106 mmol/L (ref 98–111)
Chloride: 107 mmol/L (ref 98–111)
Creatinine, Ser: 0.49 mg/dL — ABNORMAL LOW (ref 0.61–1.24)
Creatinine, Ser: 0.52 mg/dL — ABNORMAL LOW (ref 0.61–1.24)
GFR, Estimated: >60 mL/min (ref >60)
GFR, Estimated: >60 mL/min (ref >60)
Glucose, Bld: 113 mg/dL — ABNORMAL HIGH (ref 70–99)
Glucose, Bld: 164 mg/dL — ABNORMAL HIGH (ref 70–99)
Potassium: 3.5 mmol/L (ref 3.5–5.1)
Potassium: 3.8 mmol/L (ref 3.5–5.1)
Sodium: 142 mmol/L (ref 135–145)
Sodium: 141 mmol/L (ref 135–145)

## 2021-10-13 LAB — GLUCOSE, CAPILLARY
Glucose-Capillary: 105 mg/dL — ABNORMAL HIGH (ref 70–99)
Glucose-Capillary: 118 mg/dL — ABNORMAL HIGH (ref 70–99)
Glucose-Capillary: 133 mg/dL — ABNORMAL HIGH (ref 70–99)
Glucose-Capillary: 141 mg/dL — ABNORMAL HIGH (ref 70–99)
Glucose-Capillary: 151 mg/dL — ABNORMAL HIGH (ref 70–99)
Glucose-Capillary: 161 mg/dL — ABNORMAL HIGH (ref 70–99)

## 2021-10-13 LAB — PHOSPHORUS: Phosphorus: 3.7 mg/dL (ref 2.5–4.6)

## 2021-10-13 LAB — MAGNESIUM: Magnesium: 1.9 mg/dL (ref 1.7–2.4)

## 2021-10-13 MED ORDER — HEPARIN SODIUM (PORCINE) 1000 UNIT/ML IJ SOLN
1000.0000 [IU] | Freq: Once | INTRAMUSCULAR | Status: AC
  Administered 2021-10-13: 1000 [IU]
  Filled 2021-10-13: qty 1

## 2021-10-13 MED ORDER — SODIUM CHLORIDE 0.9 % IV SOLN
INTRAVENOUS | Status: AC
  Filled 2021-10-13 (×5): qty 200

## 2021-10-13 MED ORDER — CALCIUM GLUCONATE-NACL 2-0.675 GM/100ML-% IV SOLN
2.0000 g | Freq: Once | INTRAVENOUS | Status: DC
  Filled 2021-10-13 (×2): qty 100

## 2021-10-13 MED ORDER — DIPHENHYDRAMINE HCL 25 MG PO CAPS: 25.0000 mg | ORAL_CAPSULE | Freq: Four times a day (QID) | ORAL | Status: DC | PRN

## 2021-10-13 MED ORDER — HEPARIN SODIUM (PORCINE) 1000 UNIT/ML IJ SOLN
INTRAMUSCULAR | Status: AC
  Filled 2021-10-13: qty 3

## 2021-10-13 MED ORDER — CALCIUM CARBONATE ANTACID 500 MG PO CHEW: 2.0000 | CHEWABLE_TABLET | ORAL | Status: DC

## 2021-10-13 MED ORDER — ACD FORMULA A 0.73-2.45-2.2 GM/100ML VI SOLN
1000.0000 mL | Status: DC
  Administered 2021-10-13: 1000 mL
  Filled 2021-10-13: qty 1000

## 2021-10-13 MED ORDER — ACETAMINOPHEN 325 MG PO TABS: 650.0000 mg | ORAL_TABLET | ORAL | Status: DC | PRN

## 2021-10-13 NOTE — Progress Notes (Signed)
PT Cancellation Note  Patient Details Name: Troy Randall MRN: [id number] DOB: 04-Jul-1960   Cancelled Treatment:    Reason Eval/Treat Not Completed: Patient at procedure or test/unavailable Pt getting sterile dressing changed. Will follow.  [name] 10/13/2021, 10:40 AM Marisa Severin, PT, DPT Acute [facility] Services Secure chat preferred Office [phone]

## 2021-10-13 NOTE — Progress Notes (Signed)
RT came by to assess pts trach, but pt was off the floor at this time. RT will try back later.

## 2021-10-13 NOTE — Progress Notes (Signed)
PROGRESS NOTE    Troy Randall  [id number] DOB: [birth year] DOA: 09/04/2021 PCP: [name], MD   Brief Narrative:  61 years old male with medical history of hypertension, hyperlipidemia, type 2 diabetes, CVA, presented to the hospital with GI bleed.  Patient underwent IR embolization followed by percutaneous cholecystostomy tube placement due to ongoing altered mental status.  MRI of the brain showed numerous contrast-enhancing bilateral white matter lesions concerning for demyelinating disease.  Neurology was consulted and underwent lumbar puncture on 09/22/2021 with low suspicion for infectious etiology.  Neurology has initiated the patient on high-dose steroids on 09/23/2021.  Patient was then transferred to hospitalist service on 09/24/2021.  Significant events. 8/31 presented to [facility] ED, [name] Admit, GI Consult 9/1 CTA> no evidence of active GI bleed, 2 U PRBC 9/2 2 U PRBC, colonoscopy > no active bleed found but large clots throughout entire colon. EGD> small hiatal hernia, erosive gastropathy with no stigmata of recent bleeding, and non-bleeding duodenal diverticulum.   Underwent then small bowel enteroscopy which noted intermittent blood pumping in the proximal jejunum but was unable to be reached despite multiple attempts, was injected and clip placed. Repeat CTA repeated, which showed positive jejunal diverticular bleeding. IR> active extrav at [facility] territory, jejunal arcade branch, successful coil embo 9/3 Intubated. CTA> no evidence of active GI bleeding.  New finding of abnormal gallbladder with nondependent air, gallbladder wall air, pericholecystic and right upper quadrant information.  IR placed percutaneous cholecystotomy tube.  9/4 R. IJ central line placed with dialysis triple lumen catheter 9/5 CRRT initiated 9/6 Bedside EGD without any evidence of bleeding. CTA a/p without etiology for bleed 9/8 CRRT discontinued. Required ETT exchange yesterday due to blown cuff. 9/11  Bronchoscopic guided percutaneous tracheostomy placement.  Right IJ central line removed. 9/15 flexible sigmoidoscopy with removal of endoscopy 9/16 MRI brain w/o contrast amended study secondary to range of motion.  Rapid development numerous round white matter lesions in both cerebral hemispheres.  Prior MRIs. 9/17 MRI brain: Numerous contrast-enhancing bilateral white matter lesions, consistent with active demyelination. 9/18 Lumbar puncture  9/19 MRI cervical spine, thoracic : Limited exam, within limitations there is not definitive evidence of active demyelinization. Within limitations of motion artifact, there may be demyelinating lesions at the C6 and T1 vertebral body levels. 9/25-clinical exam not improved despite completion of 5-day IV steroids.  Neurology planning to repeat MRI of the brain cervical spine thoracic spine. Possible IR consult for catheter placement for possible Plex therapy if abnormal scans 9/26- scans attempted with ativan 9/29-patient transferred to ICU for respiratory distress due to aspiration pneumonia with sepsis 10/2-patient transferred back to [facility]; started having GI bleed, Dr. Mann/Dr. Benson Norway reconsulted.  Underwent sigmoidoscopy on 10/07/2021. 10/2: Plasma exchange started  Assessment & Plan:   Acute metabolic encephalopathy --MRI brain showed bilateral white matter lesions concerning for active demyelinating disease -S/p IV steroids for 5 days with no significant improvement -Currently undergoing plasma exchange as per neurology: Patient will be getting total of 5 sessions.  Follow further neurology recommendations.  Hemorrhagic shock/GI bleed -Patient has had multiple endoscopic procedures last month including EGD/capsule endoscopy/coil embolization of jejunal branch of SMA. -Possibly from stress ulceration/ischemic disease as per GI: Status post sigmoidoscopy on 10/07/2021 which showed single solitary ulcer in the sigmoid colon which was biopsied. -Has had  multiple packed red cell transfusion during this hospitalization. -Monitor H&H.  Hemoglobin 7.3 today as well.  Transfuse packed red cells if hemoglobin is less than 7.  Acute respiratory  failure with hypoxia status post tracheostomy Aspiration pneumonia -Status post tracheostomy placement on 09/15/2020.  PCCM following weekly. -Patient had acute aspiration event on 10/03/2021 and was placed on broad-spectrum antibiotics: Trach cultures grew MRSA and E. coli.  Treated with Rocephin and vancomycin and completed therapy.  E. coli cholecystitis status post percutaneous cholecystostomy drain placement -Had percutaneous cholecystostomy drain placement on 09/07/2021.  Subsequently had completed course of antibiotics -Will eventually need outpatient surgery/IR follow-up  Transaminitis -Resolved.  Thrombocytopenia -Resolved.  Diabetes mellitus type 2 with hyperglycemia -Continue long acting insulin along with CBGs with SSI  Acute kidney injury: Resolved -CRRT discontinued on 09/12/2021  Hypokalemia--improved  Hypophosphatemia -Improved  Hypertension -Continue monitoring blood pressure.  Currently on doxazosin   Obesity -Outpatient follow-up  Stage II medial sacral ulcer: Not present on admission -Continue local wound care.  Acute urinary retention--currently has Foley catheter.  We will try voiding trial today.     DVT prophylaxis: SCDs Code Status: Full Family Communication: Wife and sister at bedside  Disposition Plan: Status is: Inpatient Remains inpatient appropriate because: Of severity of illness    Consultants: GI/neurology/PCCM/general surgery/nephrology/IR  Procedures: As above  Antimicrobials:  Anti-infectives (From admission, onward)    Start     Dose/Rate Route Frequency Ordered Stop   10/06/21 1515  cefTRIAXone (ROCEPHIN) 2 g in sodium chloride 0.9 % 100 mL IVPB        2 g 200 mL/hr over 30 Minutes Intravenous Every 24 hours 10/06/21 1420 10/09/21 1756    10/04/21 0945  metroNIDAZOLE (FLAGYL) IVPB 500 mg  Status:  Discontinued        500 mg 100 mL/hr over 60 Minutes Intravenous Every 12 hours 10/04/21 0853 10/06/21 1329   10/03/21 2200  vancomycin (VANCOREADY) IVPB 1500 mg/300 mL        1,500 mg 150 mL/hr over 120 Minutes Intravenous Every 12 hours 10/03/21 0613 10/10/21 0025   10/03/21 0700  vancomycin (VANCOREADY) IVPB 2000 mg/400 mL        2,000 mg 200 mL/hr over 120 Minutes Intravenous  Once 10/03/21 0613 10/03/21 1149   10/03/21 0700  ceFEPIme (MAXIPIME) 2 g in sodium chloride 0.9 % 100 mL IVPB  Status:  Discontinued        2 g 200 mL/hr over 30 Minutes Intravenous Every 8 hours 10/03/21 0613 10/06/21 1329   10/03/21 0645  vancomycin (VANCOREADY) IVPB 2000 mg/400 mL  Status:  Discontinued        2,000 mg 200 mL/hr over 120 Minutes Intravenous  Once 10/03/21 0549 10/03/21 0748   10/03/21 0645  ceFEPIme (MAXIPIME) 2 g in sodium chloride 0.9 % 100 mL IVPB  Status:  Discontinued        2 g 200 mL/hr over 30 Minutes Intravenous  Once 10/03/21 0549 10/03/21 1121   10/03/21 0600  vancomycin (VANCOREADY) IVPB 1500 mg/300 mL  Status:  Discontinued        1,500 mg 150 mL/hr over 120 Minutes Intravenous On call 10/02/21 1334 10/03/21 0549   09/11/21 1100  cefTRIAXone (ROCEPHIN) 2 g in sodium chloride 0.9 % 100 mL IVPB        2 g 200 mL/hr over 30 Minutes Intravenous Every 24 hours 09/11/21 1010 09/13/21 1108   09/08/21 0200  piperacillin-tazobactam (ZOSYN) IVPB 3.375 g  Status:  Discontinued        3.375 g 12.5 mL/hr over 240 Minutes Intravenous Every 8 hours 09/07/21 1856 09/07/21 2114   09/07/21 2200  meropenem (MERREM) 1 g in  sodium chloride 0.9 % 100 mL IVPB  Status:  Discontinued        1 g 200 mL/hr over 30 Minutes Intravenous Every 8 hours 09/07/21 2114 09/11/21 1010   09/07/21 1945  piperacillin-tazobactam (ZOSYN) IVPB 3.375 g        3.375 g 100 mL/hr over 30 Minutes Intravenous  Once 09/07/21 1856 09/07/21 2005   09/07/21 1753   cefTRIAXone (ROCEPHIN) injection         Intravenous As needed 09/07/21 1754 09/07/21 1753   09/07/21 1749  sodium chloride 0.9 % with cefTRIAXone (ROCEPHIN) ADS Med       Note to Pharmacy: Eveline Keto E: cabinet override      09/07/21 1749 09/07/21 1935        Subjective: Patient seen and examined at bedside.  No fever, vomiting, agitation or seizures reported.  Moves right upper and lower extremities but does not move the left side. Objective: Vitals:   10/12/21 1510 10/12/21 2056 10/12/21 2116 10/13/21 0353  BP:  125/73 125/73 113/71  Pulse: (!) 109 100 (!) 104 (!) 101  Resp: 19 (!) 21 (!) 21 14  Temp:  98.6 F (37 C)  98.2 F (36.8 C)  TempSrc:  Oral  Oral  SpO2: 100% 99% 100% 100%  Weight:      Height:        Intake/Output Summary (Last 24 hours) at 10/13/2021 0737 Last data filed at 10/13/2021 0357 Gross per 24 hour  Intake --  Output 2000 ml  Net -2000 ml    Filed Weights   10/09/21 0348 10/10/21 0333 10/10/21 1048  Weight: 126.5 kg 120.9 kg 128.9 kg    Examination:  General: Still in no distress.  Chronically ill and deconditioned looking.  ENT/neck: Ears oxygen via trach respiratory: Decreased breath sounds at bases with some crackles and tachypnea  CVS: S1 and S2 are heard; tachycardic  abdominal: Soft, obese, nontender, distention present; no organomegaly, bowel sounds heard normally extremities: Lower extremity edema present; no cyanosis CNS: Still able to move right upper and lower extremities; awake currently lymph: No obvious palpable lymphadenopathy noted skin: No obvious ecchymosis/rashes psych: Currently not agitated.  Affect is flat  musculoskeletal: No obvious other joint tenderness/erythema   Data Reviewed: I have personally reviewed following labs and imaging studies  CBC: Recent Labs  Lab 10/09/21 0334 10/10/21 0317 10/11/21 0304 10/12/21 0312 10/13/21 0306  WBC 8.1 9.5 6.0 5.6 5.9  NEUTROABS 7.0 7.3 4.6 4.2 4.4  HGB 7.2* 7.3*  7.1* 7.3* 7.3*  HCT 22.6* 23.1* 23.9* 24.3* 24.5*  MCV 90.8 91.3 94.5 94.2 94.6  PLT 131* 158 148* 176 [id number]    Basic Metabolic Panel: Recent Labs  Lab 10/09/21 0334 10/10/21 0317 10/11/21 0304 10/12/21 0312 10/13/21 0306  NA 139 137 139 136 142  K 3.6 3.7 3.6 3.5 3.5  CL 113* 109 110 103 106  CO2 22 22 22 24 27   GLUCOSE 302* 256* 216* 235* 113*  BUN 10 8 8 6 8   CREATININE 0.50* 0.46* 0.58* 0.45* 0.49*  CALCIUM 7.9* 8.0* 8.5* 8.5* 9.0  MG 1.9 1.9 1.8 1.9 1.9  PHOS 1.9* 3.1 2.9 3.4 3.7    GFR: Estimated Creatinine Clearance: 138 mL/min (A) (by C-G formula based on SCr of 0.49 mg/dL (L)). Liver Function Tests: Recent Labs  Lab 10/08/21 0413 10/09/21 0334 10/10/21 0317 10/11/21 0304 10/12/21 0312  AST 136* 57* 40 26 28  ALT 175* 81* 75* 34 37  ALKPHOS 189* 69  71 37* 47  BILITOT 0.7 0.8 0.6 0.8 0.6  PROT 4.5* 4.7* 4.6* 4.7* 4.8*  ALBUMIN 2.9* 3.7 3.3* 3.7 3.6    No results for input(s): "LIPASE", "AMYLASE" in the last 168 hours.  No results for input(s): "AMMONIA" in the last 168 hours. Coagulation Profile: No results for input(s): "INR", "PROTIME" in the last 168 hours.  Cardiac Enzymes: No results for input(s): "CKTOTAL", "CKMB", "CKMBINDEX", "TROPONINI" in the last 168 hours. BNP (last 3 results) No results for input(s): "PROBNP" in the last 8760 hours. HbA1C: No results for input(s): "HGBA1C" in the last 72 hours. CBG: Recent Labs  Lab 10/12/21 1125 10/12/21 1757 10/12/21 2055 10/13/21 0028 10/13/21 0356  GLUCAP 219* 174* 174* 141* 118*    Lipid Profile: No results for input(s): "CHOL", "HDL", "LDLCALC", "TRIG", "CHOLHDL", "LDLDIRECT" in the last 72 hours. Thyroid Function Tests: No results for input(s): "TSH", "T4TOTAL", "FREET4", "T3FREE", "THYROIDAB" in the last 72 hours. Anemia Panel: No results for input(s): "VITAMINB12", "FOLATE", "FERRITIN", "TIBC", "IRON", "RETICCTPCT" in the last 72 hours. Sepsis Labs: No results for input(s):  "PROCALCITON", "LATICACIDVEN" in the last 168 hours.   Recent Results (from the past 240 hour(s))  Culture, blood (x 2)     Status: None   Collection Time: 10/03/21  8:14 AM   Specimen: BLOOD LEFT HAND  Result Value Ref Range Status   Specimen Description BLOOD LEFT HAND IN PEDIATRIC BOTTLE  Final   Special Requests   Final    BOTTLES DRAWN AEROBIC ONLY Blood Culture adequate volume   Culture   Final    NO GROWTH 5 DAYS Performed at [facility], [address], [place], [place] [address]    Report Status 10/08/2021 FINAL  Final  Culture, blood (x 2)     Status: None   Collection Time: 10/03/21  8:16 AM   Specimen: BLOOD  Result Value Ref Range Status   Specimen Description BLOOD BLOOD RIGHT ARM  Final   Special Requests   Final    BOTTLES DRAWN AEROBIC AND ANAEROBIC Blood Culture adequate volume   Culture   Final    NO GROWTH 5 DAYS Performed at [facility], [place] [address] [place], [place] [address]    Report Status 10/08/2021 FINAL  Final  Culture, Respiratory w Gram Stain     Status: None   Collection Time: 10/03/21  6:15 PM   Specimen: Tracheal Aspirate; Respiratory  Result Value Ref Range Status   Specimen Description TRACHEAL ASPIRATE  Final   Special Requests NONE  Final   Gram Stain   Final    ABUNDANT WBC PRESENT, PREDOMINANTLY PMN FEW GRAM NEGATIVE RODS FEW GRAM POSITIVE COCCI IN PAIRS Performed at [facility], [address] [place], [place] [address]    Culture   Final    MODERATE METHICILLIN RESISTANT STAPHYLOCOCCUS AUREUS RARE ESCHERICHIA COLI    Report Status 10/06/2021 FINAL  Final   Organism ID, Bacteria ESCHERICHIA COLI  Final   Organism ID, Bacteria METHICILLIN RESISTANT STAPHYLOCOCCUS AUREUS  Final      Susceptibility   Escherichia coli - MIC*    AMPICILLIN 16 INTERMEDIATE Intermediate     CEFAZOLIN <=4 SENSITIVE Sensitive     CEFEPIME <=0.12 SENSITIVE Sensitive     CEFTAZIDIME <=1 SENSITIVE Sensitive     CEFTRIAXONE  <=0.25 SENSITIVE Sensitive     CIPROFLOXACIN <=0.25 SENSITIVE Sensitive     GENTAMICIN <=1 SENSITIVE Sensitive     IMIPENEM <=0.25 SENSITIVE Sensitive  TRIMETH/SULFA <=20 SENSITIVE Sensitive     AMPICILLIN/SULBACTAM 4 SENSITIVE Sensitive     PIP/TAZO <=4 SENSITIVE Sensitive     * RARE ESCHERICHIA COLI   Methicillin resistant staphylococcus aureus - MIC*    CIPROFLOXACIN >=8 RESISTANT Resistant     ERYTHROMYCIN <=0.25 SENSITIVE Sensitive     GENTAMICIN <=0.5 SENSITIVE Sensitive     OXACILLIN >=4 RESISTANT Resistant     TETRACYCLINE <=1 SENSITIVE Sensitive     VANCOMYCIN 1 SENSITIVE Sensitive     TRIMETH/SULFA >=320 RESISTANT Resistant     CLINDAMYCIN <=0.25 SENSITIVE Sensitive     RIFAMPIN <=0.5 SENSITIVE Sensitive     Inducible Clindamycin NEGATIVE Sensitive     * MODERATE METHICILLIN RESISTANT STAPHYLOCOCCUS AUREUS         Radiology Studies: No results found.      Scheduled Meds:  Chlorhexidine Gluconate Cloth  6 each Topical Daily   doxazosin  2 mg Per Tube Daily   feeding supplement (PROSource TF20)  60 mL Per Tube BID   free water  250 mL Per Tube Q6H   influenza vac split quadrivalent PF  0.5 mL Intramuscular Tomorrow-1000   insulin aspart  0-15 Units Subcutaneous Q4H   insulin aspart  10 Units Subcutaneous Q4H   insulin glargine-yfgn  40 Units Subcutaneous BID   mouth rinse  15 mL Mouth Rinse 4 times per day   pantoprazole  40 mg Per Tube BID   pneumococcal 23 valent vaccine  0.5 mL Intramuscular Tomorrow-1000   rosuvastatin  40 mg Per Tube Daily   sodium chloride flush  10-40 mL Intracatheter Q12H   sodium chloride flush  5 mL Intracatheter Q8H   Zinc Oxide   Topical BID   Continuous Infusions:  sodium chloride 10 mL/hr at 10/10/21 1800   sodium chloride 250 mL (10/11/21 7915)   sodium chloride Stopped (10/07/21 1454)   calcium gluconate     citrate dextrose     dextrose Stopped (10/04/21 1008)   dextrose     feeding supplement (VITAL 1.5 CAL) 1,000  mL (10/10/21 1811)   lactated ringers            Aline August, MD [facility] 10/13/2021, 7:37 AM

## 2021-10-14 DIAGNOSIS — K810 Acute cholecystitis: Secondary | ICD-10-CM | POA: Diagnosis not present

## 2021-10-14 DIAGNOSIS — Z930 Tracheostomy status: Secondary | ICD-10-CM | POA: Diagnosis not present

## 2021-10-14 LAB — CBC WITH DIFFERENTIAL/PLATELET
Abs Immature Granulocytes: 0.03 10*3/uL (ref 0.00–0.07)
Basophils Absolute: 0 10*3/uL (ref 0.0–0.1)
Basophils Relative: 0 %
Eosinophils Absolute: 0.3 10*3/uL (ref 0.0–0.5)
Eosinophils Relative: 5 %
HCT: 24.2 % — ABNORMAL LOW (ref 39.0–52.0)
Hemoglobin: 7.6 g/dL — ABNORMAL LOW (ref 13.0–17.0)
Immature Granulocytes: 1 %
Lymphocytes Relative: 14 %
Lymphs Abs: 0.9 10*3/uL (ref 0.7–4.0)
MCH: 29.1 pg (ref 26.0–34.0)
MCHC: 31.4 g/dL (ref 30.0–36.0)
MCV: 92.7 fL (ref 80.0–100.0)
Monocytes Absolute: 0.4 10*3/uL (ref 0.1–1.0)
Monocytes Relative: 6 %
Neutro Abs: 4.6 10*3/uL (ref 1.7–7.7)
Neutrophils Relative %: 74 %
Platelets: 193 10*3/uL (ref 150–400)
RBC: 2.61 MIL/uL — ABNORMAL LOW (ref 4.22–5.81)
RDW: 17.5 % — ABNORMAL HIGH (ref 11.5–15.5)
WBC: 6.3 10*3/uL (ref 4.0–10.5)
nRBC: 0 % (ref 0.0–0.2)

## 2021-10-14 LAB — GLUCOSE, CAPILLARY
Glucose-Capillary: 100 mg/dL — ABNORMAL HIGH (ref 70–99)
Glucose-Capillary: 136 mg/dL — ABNORMAL HIGH (ref 70–99)
Glucose-Capillary: 141 mg/dL — ABNORMAL HIGH (ref 70–99)
Glucose-Capillary: 151 mg/dL — ABNORMAL HIGH (ref 70–99)
Glucose-Capillary: 158 mg/dL — ABNORMAL HIGH (ref 70–99)
Glucose-Capillary: 162 mg/dL — ABNORMAL HIGH (ref 70–99)

## 2021-10-14 LAB — MAGNESIUM: Magnesium: 1.8 mg/dL (ref 1.7–2.4)

## 2021-10-14 LAB — PHOSPHORUS: Phosphorus: 3.7 mg/dL (ref 2.5–4.6)

## 2021-10-14 MED ORDER — JUVEN PO PACK
1.0000 | PACK | Freq: Two times a day (BID) | ORAL | Status: DC
  Administered 2021-10-14 – 2021-10-22 (×14): 1
  Filled 2021-10-14 (×15): qty 1

## 2021-10-14 MED ORDER — SENNOSIDES-DOCUSATE SODIUM 8.6-50 MG PO TABS
1.0000 | ORAL_TABLET | Freq: Two times a day (BID) | ORAL | Status: DC
  Administered 2021-10-14 – 2021-10-20 (×9): 1
  Filled 2021-10-14 (×13): qty 1

## 2021-10-14 NOTE — Progress Notes (Signed)
Referring Physician(s): Dr. Concepcion Living  Supervising Physician: Corrie Mckusick  Patient Status:  [facility] - In-pt  Chief Complaint:  Acute cholecystitis s/p cholecystomy tube placement on 9.3.23  Subjective:  Patient alert. Unable to assess due to patient condition. Wife at bedside. All questions and concerns regarding the cholecystomy tube were answered   Allergies: Metformin and related and Penicillins  Medications: Prior to Admission medications   Medication Sig Start Date End Date Taking? Authorizing Provider  amLODipine (NORVASC) 10 MG tablet Take 1 tablet (10 mg total) by mouth daily. 06/15/17  Yes Caren Macadam, MD  aspirin EC 81 MG tablet Take 81 mg by mouth daily. Swallow whole.   Yes [provider]  clopidogrel (PLAVIX) 75 MG tablet Take 1 tablet (75 mg total) by mouth daily. 04/30/21  Yes Little Ishikawa, MD  glipiZIDE (GLUCOTROL XL) 10 MG 24 hr tablet Take 1 tablet (10 mg total) by mouth daily with breakfast. 04/16/17  Yes Hagler, Apolonio Schneiders, MD  levocetirizine (XYZAL) 5 MG tablet Take 1 tablet by mouth at bedtime.   Yes [provider]  lisinopril (PRINIVIL,ZESTRIL) 40 MG tablet Take 1 tablet (40 mg total) by mouth daily. 05/12/17  Yes Caren Macadam, MD  metFORMIN (GLUCOPHAGE-XR) 500 MG 24 hr tablet Take 500-1,000 mg by mouth in the morning and at bedtime. 02/09/21  Yes [provider]  rosuvastatin (CRESTOR) 40 MG tablet Take 1 tablet (40 mg total) by mouth daily. 04/29/21  Yes Little Ishikawa, MD  tamsulosin (FLOMAX) 0.4 MG CAPS capsule Take 0.4 mg by mouth daily. 02/12/21  Yes [provider]  traZODone (DESYREL) 150 MG tablet Take 150 mg by mouth at bedtime. 02/04/21  Yes [provider]  TRESIBA FLEXTOUCH 200 UNIT/ML FlexTouch Pen Inject 20 Units into the skin in the morning and at bedtime. 03/14/21  Yes [provider]  BD VEO INSULIN SYRINGE U/F 31G X 15/64" 1 ML MISC  USE AS DIRECTED 06/08/17   Caren Macadam, MD   glucose blood (ONETOUCH VERIO) test strip TEST twice a day 05/04/17   Caren Macadam, MD  Insulin Pen Needle (NOVOTWIST) 32G X 5 MM MISC Use two daily to inject Victoza and Toujeo. 04/25/15   Elayne Snare, MD  [facility] DELICA LANCETS 35T MISC Use to check blood sugar once a day dx code E11.65 11/21/14   Elayne Snare, MD     Vital Signs: BP 120/71 (BP Location: Left Arm)   Pulse 93   Temp 98.1 F (36.7 C) (Oral)   Resp 16   Ht 6' (1.829 m)   Wt 284 lb 2.8 oz (128.9 kg)   SpO2 98%   BMI 38.54 kg/m   Physical Exam Vitals and nursing note reviewed.  Constitutional:      Appearance: He is well-developed. He is ill-appearing.  HENT:     Head: Normocephalic.  Pulmonary:     Comments: Trached and vented Abdominal:     Comments: Positive RUQ drain to gravity bag. Site is unremarkable with no erythema, edema, tenderness, bleeding or drainage noted at exit site. Suture in place. Dressing is clean dry and intact. Scant brown colored fluid noted in gravity bag. Drain is able to be flushed easily. No leakage or pain with flushing.  Insertion site clean and dry.      Musculoskeletal:        General: Normal range of motion.     Cervical back: Normal range of motion.  Skin:    General: Skin is warm and  dry.  Neurological:     Mental Status: He is alert.     Imaging: No results found.  Labs:  CBC: Recent Labs    10/11/21 0304 10/12/21 0312 10/13/21 0306 10/14/21 0237  WBC 6.0 5.6 5.9 6.3  HGB 7.1* 7.3* 7.3* 7.6*  HCT 23.9* 24.3* 24.5* 24.2*  PLT 148* 176 197 193    COAGS: Recent Labs    04/28/21 1504 09/05/21 0307 09/06/21 2240 09/07/21 2056 10/03/21 0816  INR 1.0 1.2 1.6* 2.0* 1.0  APTT 26  --   --  32 <20*    BMP: Recent Labs    10/11/21 0304 10/12/21 0312 10/13/21 0306 10/13/21 1400  NA 139 136 142 141  K 3.6 3.5 3.5 3.8  CL 110 103 106 107  CO2 22 24 27 28   GLUCOSE 216* 235* 113* 164*  BUN 8 6 8 10   CALCIUM 8.5* 8.5* 9.0 8.5*  CREATININE 0.58*  0.45* 0.49* 0.52*  GFRNONAA >60 >60 >60 >60    LIVER FUNCTION TESTS: Recent Labs    10/09/21 0334 10/10/21 0317 10/11/21 0304 10/12/21 0312  BILITOT 0.8 0.6 0.8 0.6  AST 57* 40 26 28  ALT 81* 75* 34 37  ALKPHOS 69 71 37* 47  PROT 4.7* 4.6* 4.7* 4.8*  ALBUMIN 3.7 3.3* 3.7 3.6    Assessment and Plan:  61 y.o. male inpatient. History of HTN, HLD, DM, CVA. Presented to the ED at [facility] on 8.31.23 with dark stools, dizziness and SHOB. Found to have a GI bleed s/p mesenteric arteriogram  and embolization on 9.2.23. Hospital stay complicated by acute cholecystostomy s/p cholecystomy tube on 9.3.23. hemorrhagic and septic shock and demyelinating disease. Currently on PLEX.  Drain Location: RUQ Size: Fr size: 10 Fr Date of placement: 9.3.23  Currently to: Drain collection device: gravity 24 hour output:  Output by Drain (mL) 10/12/21 0700 - 10/12/21 1459 10/12/21 1500 - 10/12/21 2259 10/12/21 2300 - 10/13/21 0659 10/13/21 0700 - 10/13/21 1459 10/13/21 1500 - 10/13/21 2259 10/13/21 2300 - 10/14/21 0659 10/14/21 0700 - 10/14/21 1259  Closed System Drain 1 Lateral RUQ Other (Comment) 10.2 Fr.     0    Cultures from abscess abundant e. Coli. WBC is WNL. Scant brown output noted to be in the gravity bag.   Interval imaging/drain manipulation:  None pertinent  Current examination: Flushes/aspirates easily.  Insertion site unremarkable. Suture and stat lock in place. Dressed appropriately.   Plan: Continue TID flushes with 5 cc NS. Record output Q shift. Dressing changes QD or PRN if soiled.  Call IR APP or on call IR MD if difficulty flushing or sudden change in drain output.  Case discussed with IR Attending Dr. Rolla Plate. Plan for cholangiogram next week. Orders placed to accommodate.   Discharge planning: Please contact IR APP or on call IR MD prior to patient d/c to ensure appropriate follow up plans are in place. Cholecystomy tube drain will need to remain in place at least 4-6  weeks unless gallbladder surgically removed in interim; continue tid drain flushes inhouse and once daily with 5 ml sterile normal saline as outpatient; will schedule f/u cholangiogram in 4-6 weeks  IR will continue to follow - please call with questions or concerns.   Electronically Signed: Jacqualine Mau, NP 10/14/2021, 12:56 PM   I spent a total of 15 Minutes at the patient's bedside AND on the patient's hospital floor or unit, greater than 50% of which was counseling/coordinating care for cholecystomy tube placement.

## 2021-10-14 NOTE — Progress Notes (Signed)
Physical Therapy Treatment Patient Details Name: Troy Randall MRN: [id number] DOB: 10-06-1960 Today's Date: 10/14/2021   History of Present Illness Patient is a 61 y/o male who presents on 09/04/21 with dark stools, dizziness and SOB. Found to have GI bleed and acute anemia s/p embolization 09/06/21 and percutaneous cholecystostomy 09/07/21. Intubated 9/3, CRRT 9/5-9/8. Hemorrhagic and septic shock 9/5. Trach placed 9/11. 9/16 MRI rapid development numerous round white matter lesions in both cerebral hemispheres; 9/17 MRI: numerous white matter lesions consistent with active demyelination - concerning for MS, ADEM, and cerebral vasculitis; 9/18 Lumbar puncture. Pt returned to ICU on 9/29 with aspiration PNA, tachypnea, tachycardia and increased WOB. 10/2 - pt transferred back to [facility]; started having GIB. Underwent sigmoidoscopy on 10/07/2021. Plasma exchange started. PMH includes CVA, DM, HTN.    PT Comments    Pt progressing well towards his physical therapy goals this session. Following several right sided commands I.e. thumbs up, "peace" sign," squeezing hand. Emphasis on transitions, sitting balance, and pre transfer training. Pt completed x 2 sit to stands with [place], maintaining static standing without knee buckle for up to 2-3 minutes at a time. Has difficulty following command to sit back down despite mild pallor and diaphoresis. Needs multimodal tactile cues to facilitate descent. Pt wife present and very supportive. Will continue to progress as tolerated.   Vitals: Supine: 126/70, HR 109 Standing in [place]: 133/115 (122), HR 134  Return to supine: 97/59 (70)    Recommendations for follow up therapy are one component of a multi-disciplinary discharge planning process, led by the attending physician.  Recommendations may be updated based on patient status, additional functional criteria and insurance authorization.  Follow Up Recommendations  PT at Long-term acute care hospital      Assistance Recommended at Discharge Frequent or constant Supervision/Assistance  Patient can return home with the following Two people to help with walking and/or transfers;Two people to help with bathing/dressing/bathroom;Assistance with cooking/housework;Assist for transportation;Help with stairs or ramp for entrance   Equipment Recommendations  Other (comment) (defer)    Recommendations for Other Services       Precautions / Restrictions Precautions Precautions: Fall Precaution Comments: trach collar, cortrak, R flank drain Restrictions Weight Bearing Restrictions: No     Mobility  Bed Mobility Overal bed mobility: Needs Assistance Bed Mobility: Rolling, Sidelying to Sit, Sit to Sidelying Rolling: Mod assist Sidelying to sit: Max assist, +2 for safety/equipment   Sit to supine: Max assist, +2 for safety/equipment   General bed mobility comments: Pt rolling towards right with modA, hand over hand guidance for reaching and grasping rails, assist for initiating BLE's off edge of bed and trunk to upright. Difficulty with motor planning    Transfers Overall transfer level: Needs assistance Equipment used: Ambulation equipment used Transfers: Sit to/from Stand Sit to Stand: Mod assist, Max assist, +2 physical assistance           General transfer comment: Variable level of assist due to cognition. Provided tactile cues posteriorly to push glutes/hips up in addition to +2 in front with gait belt to initiate movement, and then pt able to achieve complete upright standing. x2 trials for up to 2 minutes at a time. No knee buckle noted and pt weight shifting in [place]. However, does have difficulty following cues to sit and became pale/diaphoretic. Needed max multimodal and heavy tactile cues to facilitate hip/knee flexion to descend back to edge of bed    Ambulation/Gait  Stairs             Wheelchair Mobility    Modified Rankin (Stroke  Patients Only)       Balance Overall balance assessment: Needs assistance Sitting-balance support: Feet supported, Bilateral upper extremity supported Sitting balance-Leahy Scale: Fair Sitting balance - Comments: No physical assist required, supervision for safety                                    Cognition Arousal/Alertness: Awake/alert Behavior During Therapy: Flat affect Overall Cognitive Status: Impaired/Different from baseline Area of Impairment: Following commands, Attention, Safety/judgement                   Current Attention Level: Focused   Following Commands: Follows one step commands with increased time, Follows one step commands inconsistently Safety/Judgement: Decreased awareness of safety     General Comments: Pt following multiple right sided commands throughout session, i.e. "thumbs up, peace sign, squeeze hand," however, became more inconsistent as session passed. Benefits from certain tactile cues to initiate motor tasks. Alert and visually tracking/fixating. Poor motor planning.        Exercises General Exercises - Lower Extremity Heel Slides: AAROM, Both, 5 reps, Supine Other Exercises Other Exercises: x2 bridges    General Comments        Pertinent Vitals/Pain Pain Assessment Pain Assessment: PAINAD Breathing: normal Negative Vocalization: none Facial Expression: smiling or inexpressive Body Language: relaxed Consolability: no need to console PAINAD Score: 0    Home Living                          Prior Function            PT Goals (current goals can now be found in the care plan section) Acute Rehab PT Goals Patient Stated Goal: unable to state Time For Goal Achievement: 10/28/21 Potential to Achieve Goals: Fair Progress towards PT goals: Progressing toward goals    Frequency    Min 2X/week      PT Plan Current plan remains appropriate    Co-evaluation              AM-PAC PT "6  Clicks" Mobility   Outcome Measure  Help needed turning from your back to your side while in a flat bed without using bedrails?: A Lot Help needed moving from lying on your back to sitting on the side of a flat bed without using bedrails?: A Lot Help needed moving to and from a bed to a chair (including a wheelchair)?: Total Help needed standing up from a chair using your arms (e.g., wheelchair or bedside chair)?: Total Help needed to walk in hospital room?: Total Help needed climbing 3-5 steps with a railing? : Total 6 Click Score: 8    End of Session Equipment Utilized During Treatment: Gait belt;Oxygen Activity Tolerance: Patient tolerated treatment well Patient left: in bed;with call bell/phone within reach;with family/visitor present;with bed alarm set Nurse Communication: Mobility status PT Visit Diagnosis: Muscle weakness (generalized) (M62.81);Other symptoms and signs involving the nervous system (R29.898)     Time: 4010-2725 PT Time Calculation (min) (ACUTE ONLY): 41 min  Charges:  $Therapeutic Activity: 38-52 mins                     Lillia Pauls, PT, DPT Acute Rehabilitation Services Office [phone]    Norval Morton 10/14/2021,  12:50 PM

## 2021-10-14 NOTE — Progress Notes (Signed)
Nutrition Follow-up  DOCUMENTATION CODES:   Obesity unspecified  INTERVENTION:  Continue tube feeds via gastric Cortrak: - Vital 1.5 @ 60 ml/hr (1440 ml/day) - PROSource TF20 60 ml BID - Continue free water flushes to 250 ml q 6 hours   Tube feeding regimen provides 2320 kcal, 137 grams of protein, and 2100 ml of H2O.   Start 1 packet Juven BID per tube to support wound healing  Recommend alternate route of nutrition for long term nutrition needs given pt with OG/Cortrak since 9/8. Discussed with MD.   NUTRITION DIAGNOSIS:   Increased nutrient needs related to acute illness as evidenced by estimated needs.  Ongoing  GOAL:   Patient will meet greater than or equal to 90% of their needs  Goal met via TF  MONITOR:   TF tolerance  REASON FOR ASSESSMENT:   Consult Enteral/tube feeding initiation and management  ASSESSMENT:   Pt with PMH of HTN, HLD, DM, CVA on ASA/plavix admitted 8/31 from [facility] due to GI bleed.  08/31 - admit 09/02 - s/p colonoscopy showing no active bleed but large clots throughout entire colon; s/p EGD showing small hital hernia, erosive gastropathy, non-bleeding duodenal diverticulum; s/p small bowel enteroscopy showing jejunal diverticular bleeding s/p coil embolization  09/03 - intubated; new cholecystitis s/p IR for cholecystectomy tube  09/04 - CRRT start 09/08 - CRRT stop 09/11 - s/p trach 09/12 - Cortrak placed (tip in descending duodenum) 09/15 - s/p flexible sigmoidoscopy 09/18 - s/p LP 09/29 - concern for aspiration due to bilious discharge from trach site, TF held, transferred to ICU 09/30 - TF resumed 10/01 - s/p HD catheter placement for PLEX 10/02 - first PLEX  Last PLEX session planned for 10/15/21  Pt sleeping at time of visit. Spoke with pt's wife at bedside. She reports that he has not been sleeping well, just had PT and that seemed to tire him. He has been tolerating TF without nausea or vomiting. Last documented BM 10/7-  reached out to MD with recommendation for schedule bowel regimen.  TF at goal rate. Held d/t PT. Resumed TF and made RN aware.   Cortrak has been present since 9/12 and pt has been on TF since 9/5. It is likely pt will need long term nutrition access given inability to advance diet from NPO status. Discussed with ST and MD.   Medications: doxazosin, SSI 0-15 units q4h, SSI 10 units q4h, semglee 40 units BID, protonix  Labs: CBG's 100-162 x24 hours  Diet Order:   Diet Order             Diet NPO time specified  Diet effective now                   EDUCATION NEEDS:   No education needs have been identified at this time  Skin:  Skin Assessment: Skin Integrity Issues: Skin Integrity Issues:: Stage II Stage II: medial sacrum  Last BM:  10/06/21 rectal tube  Height:   Ht Readings from Last 1 Encounters:  10/06/21 6' (1.829 m)   Weight:   Wt Readings from Last 1 Encounters:  10/10/21 128.9 kg    Ideal Body Weight:  80.9 kg  BMI:  Body mass index is 38.54 kg/m.  Estimated Nutritional Needs:   Kcal:  2200-2500  Protein:  130-150 grams  Fluid:  >2 L/day  Clayborne Dana, RDN, LDN Clinical Nutrition

## 2021-10-14 NOTE — Progress Notes (Signed)
Speech Language Pathology Treatment: [name] Speaking valve  Patient Details Name: ARION SHANKLES MRN: [id number] DOB: [birth year] Today's Date: 10/14/2021 Time: 2297-9892 SLP Time Calculation (min) (ACUTE ONLY): 25 min  Assessment / Plan / Recommendation Clinical Impression  Patient seen by SLP for skilled treatment session focused on dysphagia goals. Patient's daughter in room for session. Patient was awake and alert but continues with significantly impaired focused attention. He will look at a speaker (SLP or daughter) and follows some basic commands (squeeze hand, thumbs up, etc) and although he can respond to questions with yes head nod, he does not demonstrate meaningful intent and has not responded with a 'no'.  He tolerated PMV donning and doffing without difficulty and without changes in vitals and tolerated PMV in place for total of 20 minutes. No backpressure observed when PMV periodically removed. Patient did demonstrate instances of breathing from mouth, but no attempts made to vocalize or verbalize or even mouth words. He will need to make significant improvements in his ability to attend and initiate before he will be ready/safe for PO trials. SLP will continue to follow.   HPI HPI: Pt is a 61 y.o. male with history of stroke in 4/23, HTN, HLD and DM with recent severe jejunal bleed s/p vessel coiling who is being seen for altered mental status and concerning white matter changes on MRI. Patient's wife reports that after his stroke in 4/23 he made a good recovery and was able to perform ADLs. He had some residual dysarthria. Pt presented to [facility] on 8/31 with GI bleeding and has remained persistently altered. Intubated 9/3 and trach placed 9/11. He currently has #6 cuffed trach tolerating cuff deflated.      SLP Plan  Continue with current plan of care      Recommendations for follow up therapy are one component of a multi-disciplinary discharge planning process,  led by the attending physician.  Recommendations may be updated based on patient status, additional functional criteria and insurance authorization.    Recommendations  Diet recommendations: NPO Medication Administration: Via alternative means                Oral Care Recommendations: Oral care QID Follow Up Recommendations: SLP at Long-term acute care hospital Assistance recommended at discharge: Frequent or constant Supervision/Assistance SLP Visit Diagnosis: Aphonia (R49.1) Plan: Continue with current plan of care           Sonia Baller, MA, CCC-SLP Speech Therapy

## 2021-10-14 NOTE — Progress Notes (Signed)
PROGRESS NOTE    Troy Randall  ZOX:[id number] DOB: 1960/04/16 DOA: 09/04/2021 PCP: [name], MD   Brief Narrative:  61 years old male with medical history of hypertension, hyperlipidemia, type 2 diabetes, CVA, presented to the hospital with GI bleed.  Patient underwent IR embolization followed by percutaneous cholecystostomy tube placement due to ongoing altered mental status.  MRI of the brain showed numerous contrast-enhancing bilateral white matter lesions concerning for demyelinating disease.  Neurology was consulted and underwent lumbar puncture on 09/22/2021 with low suspicion for infectious etiology.  Neurology has initiated the patient on high-dose steroids on 09/23/2021.  Patient was then transferred to hospitalist service on 09/24/2021.  Significant events. 8/31 presented to [facility] ED, [name] Admit, GI Consult 9/1 CTA> no evidence of active GI bleed, 2 U PRBC 9/2 2 U PRBC, colonoscopy > no active bleed found but large clots throughout entire colon. EGD> small hiatal hernia, erosive gastropathy with no stigmata of recent bleeding, and non-bleeding duodenal diverticulum.   Underwent then small bowel enteroscopy which noted intermittent blood pumping in the proximal jejunum but was unable to be reached despite multiple attempts, was injected and clip placed. Repeat CTA repeated, which showed positive jejunal diverticular bleeding. IR> active extrav at [facility] territory, jejunal arcade branch, successful coil embo 9/3 Intubated. CTA> no evidence of active GI bleeding.  New finding of abnormal gallbladder with nondependent air, gallbladder wall air, pericholecystic and right upper quadrant information.  IR placed percutaneous cholecystotomy tube.  9/4 R. IJ central line placed with dialysis triple lumen catheter 9/5 CRRT initiated 9/6 Bedside EGD without any evidence of bleeding. CTA a/p without etiology for bleed 9/8 CRRT discontinued. Required ETT exchange yesterday due to blown cuff. 9/11  Bronchoscopic guided percutaneous tracheostomy placement.  Right IJ central line removed. 9/15 flexible sigmoidoscopy with removal of endoscopy 9/16 MRI brain w/o contrast amended study secondary to range of motion.  Rapid development numerous round white matter lesions in both cerebral hemispheres.  Prior MRIs. 9/17 MRI brain: Numerous contrast-enhancing bilateral white matter lesions, consistent with active demyelination. 9/18 Lumbar puncture  9/19 MRI cervical spine, thoracic : Limited exam, within limitations there is not definitive evidence of active demyelinization. Within limitations of motion artifact, there may be demyelinating lesions at the C6 and T1 vertebral body levels. 9/25-clinical exam not improved despite completion of 5-day IV steroids.  Neurology planning to repeat MRI of the brain cervical spine thoracic spine. Possible IR consult for catheter placement for possible Plex therapy if abnormal scans 9/26- scans attempted with ativan 9/29-patient transferred to ICU for respiratory distress due to aspiration pneumonia with sepsis 10/2-patient transferred back to [facility]; started having GI bleed, Dr. Mann/Dr. Benson Norway reconsulted.  Underwent sigmoidoscopy on 10/07/2021. 10/2: Plasma exchange started  Assessment & Plan:   Acute metabolic encephalopathy --MRI brain showed bilateral white matter lesions concerning for active demyelinating disease -S/p IV steroids for 5 days with no significant improvement -Currently undergoing plasma exchange as per neurology: Patient will be getting total of 5 sessions, last session on 10/15/2021..  Follow further neurology recommendations.  Hemorrhagic shock/GI bleed -Patient has had multiple endoscopic procedures last month including EGD/capsule endoscopy/coil embolization of jejunal branch of SMA. -Possibly from stress ulceration/ischemic disease as per GI: Status post sigmoidoscopy on 10/07/2021 which showed single solitary ulcer in the sigmoid colon  which was biopsied. -Has had multiple packed red cell transfusion during this hospitalization. -Monitor H&H.  Hemoglobin 7.6 today.  Transfuse packed red cells if hemoglobin is less than 7.  Acute respiratory failure with hypoxia status post tracheostomy Aspiration pneumonia -Status post tracheostomy placement on 09/15/2020.  PCCM following weekly. -Patient had acute aspiration event on 10/03/2021 and was placed on broad-spectrum antibiotics: Trach cultures grew MRSA and E. coli.  Treated with Rocephin and vancomycin and completed therapy.  E. coli cholecystitis status post percutaneous cholecystostomy drain placement -Had percutaneous cholecystostomy drain placement on 09/07/2021.  Subsequently had completed course of antibiotics -Will eventually need outpatient surgery/IR follow-up  Transaminitis -Resolved.  Thrombocytopenia -Resolved.  Diabetes mellitus type 2 with hyperglycemia -Continue long acting insulin along with CBGs with SSI  Acute kidney injury: Resolved -CRRT discontinued on 09/12/2021  Hypokalemia--improved  Hypophosphatemia -Improved  Hypertension -Continue monitoring blood pressure.  Currently on doxazosin   Obesity -Outpatient follow-up  Stage II medial sacral ulcer: Not present on admission -Continue local wound care.  Acute urinary retention -Foley catheter removed on 10/13/2021.  Subsequently, he has had issues with retention.  We will try straight in and out cath: If still continues to have issues with retention, will have to place Foley catheter again. -Continue doxazosin    DVT prophylaxis: SCDs Code Status: Full Family Communication: Wife at bedside  Disposition Plan: Status is: Inpatient Remains inpatient appropriate because: Of severity of illness    Consultants: GI/neurology/PCCM/general surgery/nephrology/IR  Procedures: As above  Antimicrobials:  Anti-infectives (From admission, onward)    Start     Dose/Rate Route Frequency Ordered  Stop   10/06/21 1515  cefTRIAXone (ROCEPHIN) 2 g in sodium chloride 0.9 % 100 mL IVPB        2 g 200 mL/hr over 30 Minutes Intravenous Every 24 hours 10/06/21 1420 10/09/21 1756   10/04/21 0945  metroNIDAZOLE (FLAGYL) IVPB 500 mg  Status:  Discontinued        500 mg 100 mL/hr over 60 Minutes Intravenous Every 12 hours 10/04/21 0853 10/06/21 1329   10/03/21 2200  vancomycin (VANCOREADY) IVPB 1500 mg/300 mL        1,500 mg 150 mL/hr over 120 Minutes Intravenous Every 12 hours 10/03/21 0613 10/10/21 0025   10/03/21 0700  vancomycin (VANCOREADY) IVPB 2000 mg/400 mL        2,000 mg 200 mL/hr over 120 Minutes Intravenous  Once 10/03/21 0613 10/03/21 1149   10/03/21 0700  ceFEPIme (MAXIPIME) 2 g in sodium chloride 0.9 % 100 mL IVPB  Status:  Discontinued        2 g 200 mL/hr over 30 Minutes Intravenous Every 8 hours 10/03/21 0613 10/06/21 1329   10/03/21 0645  vancomycin (VANCOREADY) IVPB 2000 mg/400 mL  Status:  Discontinued        2,000 mg 200 mL/hr over 120 Minutes Intravenous  Once 10/03/21 0549 10/03/21 0748   10/03/21 0645  ceFEPIme (MAXIPIME) 2 g in sodium chloride 0.9 % 100 mL IVPB  Status:  Discontinued        2 g 200 mL/hr over 30 Minutes Intravenous  Once 10/03/21 0549 10/03/21 1121   10/03/21 0600  vancomycin (VANCOREADY) IVPB 1500 mg/300 mL  Status:  Discontinued        1,500 mg 150 mL/hr over 120 Minutes Intravenous On call 10/02/21 1334 10/03/21 0549   09/11/21 1100  cefTRIAXone (ROCEPHIN) 2 g in sodium chloride 0.9 % 100 mL IVPB        2 g 200 mL/hr over 30 Minutes Intravenous Every 24 hours 09/11/21 1010 09/13/21 1108   09/08/21 0200  piperacillin-tazobactam (ZOSYN) IVPB 3.375 g  Status:  Discontinued  3.375 g 12.5 mL/hr over 240 Minutes Intravenous Every 8 hours 09/07/21 1856 09/07/21 2114   09/07/21 2200  meropenem (MERREM) 1 g in sodium chloride 0.9 % 100 mL IVPB  Status:  Discontinued        1 g 200 mL/hr over 30 Minutes Intravenous Every 8 hours 09/07/21 2114  09/11/21 1010   09/07/21 1945  piperacillin-tazobactam (ZOSYN) IVPB 3.375 g        3.375 g 100 mL/hr over 30 Minutes Intravenous  Once 09/07/21 1856 09/07/21 2005   09/07/21 1753  cefTRIAXone (ROCEPHIN) injection         Intravenous As needed 09/07/21 1754 09/07/21 1753   09/07/21 1749  sodium chloride 0.9 % with cefTRIAXone (ROCEPHIN) ADS Med       Note to Pharmacy: Eveline Keto E: cabinet override      09/07/21 1749 09/07/21 1935        Subjective: Patient seen and examined at bedside.  No agitation, fever, vomiting or seizures reported.  Objective: Vitals:   10/14/21 0043 10/14/21 0130 10/14/21 0442 10/14/21 0502  BP: 109/67  119/68 119/68  Pulse: (!) 106 95 98 96  Resp: (!) 26 (!) 23 (!) 21 18  Temp:      TempSrc:      SpO2: 100% 100% 98% 98%  Weight:      Height:        Intake/Output Summary (Last 24 hours) at 10/14/2021 0749 Last data filed at 10/14/2021 0100 Gross per 24 hour  Intake 70 ml  Output 1050 ml  Net -980 ml    Filed Weights   10/09/21 0348 10/10/21 0333 10/10/21 1048  Weight: 126.5 kg 120.9 kg 128.9 kg    Examination:  General: No acute distress currently.  Chronically ill and deconditioned looking.  ENT/neck: Currently on 5 L oxygen via tracheostomy  respiratory: Bilateral decreased breath sounds at bases with scattered crackles and intermittently tachypneic  CVS: Currently rate controlled; S1-S2 heard  abdominal: Soft, obese, nontender, still distended; no organomegaly, normal bowel sounds are heard  extremities: No clubbing; mild lower extremity edema present  CNS: Moves right upper and lower extremity; sleepy, wakes up slightly.  Lymph: No cervical lymphadenopathy palpated skin: No obvious petechiae/lesions  psych: Flat affect.  Showing no signs of agitation currently. musculoskeletal: No obvious other joint tenderness/erythema   Data Reviewed: I have personally reviewed following labs and imaging studies  CBC: Recent Labs  Lab  10/10/21 0317 10/11/21 0304 10/12/21 0312 10/13/21 0306 10/14/21 0237  WBC 9.5 6.0 5.6 5.9 6.3  NEUTROABS 7.3 4.6 4.2 4.4 4.6  HGB 7.3* 7.1* 7.3* 7.3* 7.6*  HCT 23.1* 23.9* 24.3* 24.5* 24.2*  MCV 91.3 94.5 94.2 94.6 92.7  PLT 158 148* 176 197 0000000    Basic Metabolic Panel: Recent Labs  Lab 10/10/21 0317 10/11/21 0304 10/12/21 0312 10/13/21 0306 10/13/21 1400 10/14/21 0237  NA 137 139 136 142 141  --   K 3.7 3.6 3.5 3.5 3.8  --   CL 109 110 103 106 107  --   CO2 22 22 24 27 28   --   GLUCOSE 256* 216* 235* 113* 164*  --   BUN 8 8 6 8 10   --   CREATININE 0.46* 0.58* 0.45* 0.49* 0.52*  --   CALCIUM 8.0* 8.5* 8.5* 9.0 8.5*  --   MG 1.9 1.8 1.9 1.9  --  1.8  PHOS 3.1 2.9 3.4 3.7  --  3.7    GFR: Estimated Creatinine Clearance:  138 mL/min (A) (by C-G formula based on SCr of 0.52 mg/dL (L)). Liver Function Tests: Recent Labs  Lab 10/08/21 0413 10/09/21 0334 10/10/21 0317 10/11/21 0304 10/12/21 0312  AST 136* 57* 40 26 28  ALT 175* 81* 75* 34 37  ALKPHOS 189* 69 71 37* 47  BILITOT 0.7 0.8 0.6 0.8 0.6  PROT 4.5* 4.7* 4.6* 4.7* 4.8*  ALBUMIN 2.9* 3.7 3.3* 3.7 3.6    No results for input(s): "LIPASE", "AMYLASE" in the last 168 hours.  No results for input(s): "AMMONIA" in the last 168 hours. Coagulation Profile: No results for input(s): "INR", "PROTIME" in the last 168 hours.  Cardiac Enzymes: No results for input(s): "CKTOTAL", "CKMB", "CKMBINDEX", "TROPONINI" in the last 168 hours. BNP (last 3 results) No results for input(s): "PROBNP" in the last 8760 hours. HbA1C: No results for input(s): "HGBA1C" in the last 72 hours. CBG: Recent Labs  Lab 10/13/21 1127 10/13/21 1651 10/13/21 2135 10/14/21 0022 10/14/21 0434  GLUCAP 161* 105* 133* 136* 100*    Lipid Profile: No results for input(s): "CHOL", "HDL", "LDLCALC", "TRIG", "CHOLHDL", "LDLDIRECT" in the last 72 hours. Thyroid Function Tests: No results for input(s): "TSH", "T4TOTAL", "FREET4", "T3FREE",  "THYROIDAB" in the last 72 hours. Anemia Panel: No results for input(s): "VITAMINB12", "FOLATE", "FERRITIN", "TIBC", "IRON", "RETICCTPCT" in the last 72 hours. Sepsis Labs: No results for input(s): "PROCALCITON", "LATICACIDVEN" in the last 168 hours.   No results found for this or any previous visit (from the past 240 hour(s)).        Radiology Studies: No results found.      Scheduled Meds:  Chlorhexidine Gluconate Cloth  6 each Topical Daily   doxazosin  2 mg Per Tube Daily   feeding supplement (PROSource TF20)  60 mL Per Tube BID   free water  250 mL Per Tube Q6H   influenza vac split quadrivalent PF  0.5 mL Intramuscular Tomorrow-1000   insulin aspart  0-15 Units Subcutaneous Q4H   insulin aspart  10 Units Subcutaneous Q4H   insulin glargine-yfgn  40 Units Subcutaneous BID   mouth rinse  15 mL Mouth Rinse 4 times per day   pantoprazole  40 mg Per Tube BID   pneumococcal 23 valent vaccine  0.5 mL Intramuscular Tomorrow-1000   rosuvastatin  40 mg Per Tube Daily   sodium chloride flush  10-40 mL Intracatheter Q12H   sodium chloride flush  5 mL Intracatheter Q8H   Zinc Oxide   Topical BID   Continuous Infusions:  sodium chloride 10 mL/hr at 10/10/21 1800   sodium chloride 250 mL (10/11/21 [id number])   sodium chloride Stopped (10/07/21 1454)   dextrose Stopped (10/04/21 1008)   dextrose     feeding supplement (VITAL 1.5 CAL) 1,000 mL (10/10/21 1811)   lactated ringers            Aline August, MD [facility] 10/14/2021, 7:49 AM

## 2021-10-14 NOTE — Progress Notes (Signed)
MD @bedside  w/spouse.  Had to wait to bladder scan pt. Pt had scan volume of 85mls. Reached out to on call MD for foley vs straight cath.  Straight cath order entered.  Enter foley @next  scan if still retaining.  Waiting on RT to finish w/trach care.

## 2021-10-14 NOTE — Progress Notes (Signed)
Pt straight cathed w/1056mls output @0100 .  Pt bladder scanned after w/ 93mls @0100 .  Pt bladder scanned w/ 310mls in bladder @0630 .  Dayshift nurse is aware.

## 2021-10-14 NOTE — Plan of Care (Signed)
  Problem: Education: Goal: Knowledge of General Education information will improve Description: Including pain rating scale, medication(s)/side effects and non-pharmacologic comfort measures Outcome: Progressing   Problem: Health Behavior/Discharge Planning: Goal: Ability to manage health-related needs will improve Outcome: Progressing   Problem: Clinical Measurements: Goal: Ability to maintain clinical measurements within normal limits will improve Outcome: Progressing Goal: Will remain free from infection Outcome: Progressing Goal: Diagnostic test results will improve Outcome: Progressing Goal: Respiratory complications will improve Outcome: Progressing Goal: Cardiovascular complication will be avoided Outcome: Progressing   Problem: Activity: Goal: Risk for activity intolerance will decrease Outcome: Progressing   Problem: Nutrition: Goal: Adequate nutrition will be maintained Outcome: Progressing   Problem: Coping: Goal: Level of anxiety will decrease Outcome: Progressing   Problem: Elimination: Goal: Will not experience complications related to bowel motility Outcome: Progressing Goal: Will not experience complications related to urinary retention Outcome: Progressing   Problem: Pain Managment: Goal: General experience of comfort will improve Outcome: Progressing   Problem: Safety: Goal: Ability to remain free from injury will improve Outcome: Progressing   Problem: Skin Integrity: Goal: Risk for impaired skin integrity will decrease Outcome: Progressing   Problem: Education: Goal: Ability to describe self-care measures that may prevent or decrease complications (Diabetes Survival Skills Education) will improve Outcome: Progressing Goal: Individualized Educational Video(s) Outcome: Progressing   Problem: Coping: Goal: Ability to adjust to condition or change in health will improve Outcome: Progressing   Problem: Fluid Volume: Goal: Ability to  maintain a balanced intake and output will improve Outcome: Progressing   Problem: Health Behavior/Discharge Planning: Goal: Ability to identify and utilize available resources and services will improve Outcome: Progressing Goal: Ability to manage health-related needs will improve Outcome: Progressing   Problem: Metabolic: Goal: Ability to maintain appropriate glucose levels will improve Outcome: Progressing   Problem: Nutritional: Goal: Maintenance of adequate nutrition will improve Outcome: Progressing Goal: Progress toward achieving an optimal weight will improve Outcome: Progressing   Problem: Skin Integrity: Goal: Risk for impaired skin integrity will decrease Outcome: Progressing   Problem: Tissue Perfusion: Goal: Adequacy of tissue perfusion will improve Outcome: Progressing   Problem: Education: Goal: Understanding of CV disease, CV risk reduction, and recovery process will improve Outcome: Progressing Goal: Individualized Educational Video(s) Outcome: Progressing   Problem: Activity: Goal: Ability to return to baseline activity level will improve Outcome: Progressing   Problem: Cardiovascular: Goal: Ability to achieve and maintain adequate cardiovascular perfusion will improve Outcome: Progressing Goal: Vascular access site(s) Level 0-1 will be maintained Outcome: Progressing   Problem: Health Behavior/Discharge Planning: Goal: Ability to safely manage health-related needs after discharge will improve Outcome: Progressing   Problem: Education: Goal: Knowledge about tracheostomy care/management will improve Outcome: Progressing   Problem: Activity: Goal: Ability to tolerate increased activity will improve Outcome: Progressing   Problem: Health Behavior/Discharge Planning: Goal: Ability to manage tracheostomy will improve Outcome: Progressing   Problem: Respiratory: Goal: Patent airway maintenance will improve Outcome: Progressing   Problem: Role  Relationship: Goal: Ability to communicate will improve Outcome: Progressing   

## 2021-10-15 DIAGNOSIS — G9341 Metabolic encephalopathy: Secondary | ICD-10-CM | POA: Diagnosis not present

## 2021-10-15 LAB — CBC WITH DIFFERENTIAL/PLATELET
Abs Immature Granulocytes: 0.04 10*3/uL (ref 0.00–0.07)
Basophils Absolute: 0 10*3/uL (ref 0.0–0.1)
Basophils Relative: 1 %
Eosinophils Absolute: 0.3 10*3/uL (ref 0.0–0.5)
Eosinophils Relative: 5 %
HCT: 25.5 % — ABNORMAL LOW (ref 39.0–52.0)
Hemoglobin: 7.6 g/dL — ABNORMAL LOW (ref 13.0–17.0)
Immature Granulocytes: 1 %
Lymphocytes Relative: 19 %
Lymphs Abs: 1.2 10*3/uL (ref 0.7–4.0)
MCH: 28.4 pg (ref 26.0–34.0)
MCHC: 29.8 g/dL — ABNORMAL LOW (ref 30.0–36.0)
MCV: 95.1 fL (ref 80.0–100.0)
Monocytes Absolute: 0.4 10*3/uL (ref 0.1–1.0)
Monocytes Relative: 7 %
Neutro Abs: 4.4 10*3/uL (ref 1.7–7.7)
Neutrophils Relative %: 67 %
Platelets: 239 10*3/uL (ref 150–400)
RBC: 2.68 MIL/uL — ABNORMAL LOW (ref 4.22–5.81)
RDW: 17.2 % — ABNORMAL HIGH (ref 11.5–15.5)
WBC: 6.4 10*3/uL (ref 4.0–10.5)
nRBC: 0 % (ref 0.0–0.2)

## 2021-10-15 LAB — GLUCOSE, CAPILLARY
Glucose-Capillary: 123 mg/dL — ABNORMAL HIGH (ref 70–99)
Glucose-Capillary: 156 mg/dL — ABNORMAL HIGH (ref 70–99)
Glucose-Capillary: 179 mg/dL — ABNORMAL HIGH (ref 70–99)
Glucose-Capillary: 181 mg/dL — ABNORMAL HIGH (ref 70–99)
Glucose-Capillary: 211 mg/dL — ABNORMAL HIGH (ref 70–99)
Glucose-Capillary: 85 mg/dL (ref 70–99)

## 2021-10-15 LAB — PHOSPHORUS: Phosphorus: 3.3 mg/dL (ref 2.5–4.6)

## 2021-10-15 LAB — BASIC METABOLIC PANEL
Anion gap: 8 (ref 5–15)
BUN: 14 mg/dL (ref 6–20)
CO2: 27 mmol/L (ref 22–32)
Calcium: 8.8 mg/dL — ABNORMAL LOW (ref 8.9–10.3)
Chloride: 105 mmol/L (ref 98–111)
Creatinine, Ser: 0.51 mg/dL — ABNORMAL LOW (ref 0.61–1.24)
GFR, Estimated: >60 mL/min (ref >60)
Glucose, Bld: 117 mg/dL — ABNORMAL HIGH (ref 70–99)
Potassium: 3.7 mmol/L (ref 3.5–5.1)
Sodium: 140 mmol/L (ref 135–145)

## 2021-10-15 LAB — MAGNESIUM: Magnesium: 1.9 mg/dL (ref 1.7–2.4)

## 2021-10-15 MED ORDER — ACD FORMULA A 0.73-2.45-2.2 GM/100ML VI SOLN
1000.0000 mL | Status: DC
  Administered 2021-10-15: 1000 mL
  Filled 2021-10-15 (×2): qty 1000

## 2021-10-15 MED ORDER — HEPARIN SODIUM (PORCINE) 1000 UNIT/ML IJ SOLN
1000.0000 [IU] | Freq: Once | INTRAMUSCULAR | Status: AC
  Administered 2021-10-15: 1000 [IU]
  Filled 2021-10-15: qty 1

## 2021-10-15 MED ORDER — ACETAMINOPHEN 325 MG PO TABS
650.0000 mg | ORAL_TABLET | ORAL | Status: DC | PRN
  Administered 2021-10-15 – 2021-10-28 (×16): 650 mg via ORAL
  Filled 2021-10-15 (×16): qty 2

## 2021-10-15 MED ORDER — CALCIUM GLUCONATE-NACL 2-0.675 GM/100ML-% IV SOLN
2.0000 g | Freq: Once | INTRAVENOUS | Status: AC
  Administered 2021-10-15: 2000 mg via INTRAVENOUS
  Filled 2021-10-15: qty 100

## 2021-10-15 MED ORDER — SODIUM CHLORIDE 0.9 % IV SOLN
INTRAVENOUS | Status: AC
  Filled 2021-10-15 (×4): qty 200

## 2021-10-15 MED ORDER — DIPHENHYDRAMINE HCL 25 MG PO CAPS
25.0000 mg | ORAL_CAPSULE | Freq: Four times a day (QID) | ORAL | Status: DC | PRN
  Administered 2021-10-15: 25 mg via ORAL
  Filled 2021-10-15: qty 1

## 2021-10-15 NOTE — Progress Notes (Signed)
PROGRESS NOTE    SLAYTER MOORHOUSE  OEV:[id number] DOB: [birth year] DOA: 09/04/2021 PCP: [name], MD   Brief Narrative:  61 years old male with medical history of hypertension, hyperlipidemia, type 2 diabetes, CVA, presented to the hospital with GI bleed.  Patient underwent IR embolization followed by percutaneous cholecystostomy tube placement due to ongoing altered mental status.  MRI of the brain showed numerous contrast-enhancing bilateral white matter lesions concerning for demyelinating disease.  Neurology was consulted and underwent lumbar puncture on 09/22/2021 with low suspicion for infectious etiology.  Neurology has initiated the patient on high-dose steroids on 09/23/2021.  Patient was then transferred to hospitalist service on 09/24/2021.  Significant events. 8/31 presented to [facility] ED, [name] Admit, GI Consult 9/1 CTA> no evidence of active GI bleed, 2 U PRBC 9/2 2 U PRBC, colonoscopy > no active bleed found but large clots throughout entire colon. EGD> small hiatal hernia, erosive gastropathy with no stigmata of recent bleeding, and non-bleeding duodenal diverticulum.   Underwent then small bowel enteroscopy which noted intermittent blood pumping in the proximal jejunum but was unable to be reached despite multiple attempts, was injected and clip placed. Repeat CTA repeated, which showed positive jejunal diverticular bleeding. IR> active extrav at [facility] territory, jejunal arcade branch, successful coil embo 9/3 Intubated. CTA> no evidence of active GI bleeding.  New finding of abnormal gallbladder with nondependent air, gallbladder wall air, pericholecystic and right upper quadrant information.  IR placed percutaneous cholecystotomy tube.  9/4 R. IJ central line placed with dialysis triple lumen catheter 9/5 CRRT initiated 9/6 Bedside EGD without any evidence of bleeding. CTA a/p without etiology for bleed 9/8 CRRT discontinued. Required ETT exchange yesterday due to blown cuff. 9/11  Bronchoscopic guided percutaneous tracheostomy placement.  Right IJ central line removed. 9/15 flexible sigmoidoscopy with removal of endoscopy 9/16 MRI brain w/o contrast amended study secondary to range of motion.  Rapid development numerous round white matter lesions in both cerebral hemispheres.  Prior MRIs. 9/17 MRI brain: Numerous contrast-enhancing bilateral white matter lesions, consistent with active demyelination. 9/18 Lumbar puncture  9/19 MRI cervical spine, thoracic : Limited exam, within limitations there is not definitive evidence of active demyelinization. Within limitations of motion artifact, there may be demyelinating lesions at the C6 and T1 vertebral body levels. 9/25-clinical exam not improved despite completion of 5-day IV steroids.  Neurology planning to repeat MRI of the brain cervical spine thoracic spine. Possible IR consult for catheter placement for possible Plex therapy if abnormal scans 9/26- scans attempted with ativan 9/29-patient transferred to ICU for respiratory distress due to aspiration pneumonia with sepsis 10/2-patient transferred back to [facility]; started having GI bleed, Dr. Mann/Dr. Benson Norway reconsulted.  Underwent sigmoidoscopy on 10/07/2021. 10/2: Plasma exchange started  Assessment & Plan:   Acute metabolic encephalopathy --MRI brain showed bilateral white matter lesions concerning for active demyelinating disease -S/p IV steroids for 5 days with no significant improvement -Currently undergoing plasma exchange as per neurology: Patient will be getting total of 5 sessions, last session on 10/15/2021..  Follow further neurology recommendations.  Hemorrhagic shock/GI bleed -Patient has had multiple endoscopic procedures last month including EGD/capsule endoscopy/coil embolization of jejunal branch of SMA. -Possibly from stress ulceration/ischemic disease as per GI: Status post sigmoidoscopy on 10/07/2021 which showed single solitary ulcer in the sigmoid colon  which was biopsied and pathology shows chronic colitis. -Has had multiple packed red cell transfusion during this hospitalization. -Monitor H&H.  Hemoglobin has remained stable now between 7 and 8.  Transfuse packed red cells if hemoglobin is less than 7.  Acute respiratory failure with hypoxia status post tracheostomy Aspiration pneumonia -Status post tracheostomy placement on 09/15/2020.  PCCM following weekly. -Patient had acute aspiration event on 10/03/2021 and was placed on broad-spectrum antibiotics: Trach cultures grew MRSA and E. coli.  Treated with Rocephin and vancomycin and completed therapy.  E. coli cholecystitis status post percutaneous cholecystostomy drain placement -Had percutaneous cholecystostomy drain placement on 09/07/2021.  Subsequently had completed course of antibiotics -Will eventually need outpatient surgery/IR follow-up  Transaminitis -Resolved.  Thrombocytopenia -Resolved.  Diabetes mellitus type 2 with hyperglycemia -Continue long acting insulin along with CBGs with SSI  Acute kidney injury: Resolved -CRRT discontinued on 09/12/2021  Hypokalemia--improved  Hypophosphatemia -Improved  Hypertension -Continue monitoring blood pressure.  Currently on doxazosin   Obesity -Outpatient follow-up  Stage II medial sacral ulcer: Not present on admission -Continue local wound care.  Acute urinary retention -Foley catheter removed on 10/13/2021.  Subsequently, he has had issues with retention.  We will try straight in and out cath: If still continues to have issues with retention, will have to place Foley catheter again. -Continue doxazosin  DVT prophylaxis: SCDs Code Status: Full Family Communication: Wife at bedside  Disposition Plan: Status is: Inpatient Remains inpatient appropriate because: Completing last dose of Plex today.  Will need insurance authorization for LTAC after that.    Consultants: GI/neurology/PCCM/general  surgery/nephrology/IR  Procedures: As above  Antimicrobials:  Anti-infectives (From admission, onward)    Start     Dose/Rate Route Frequency Ordered Stop   10/06/21 1515  cefTRIAXone (ROCEPHIN) 2 g in sodium chloride 0.9 % 100 mL IVPB        2 g 200 mL/hr over 30 Minutes Intravenous Every 24 hours 10/06/21 1420 10/09/21 1756   10/04/21 0945  metroNIDAZOLE (FLAGYL) IVPB 500 mg  Status:  Discontinued        500 mg 100 mL/hr over 60 Minutes Intravenous Every 12 hours 10/04/21 0853 10/06/21 1329   10/03/21 2200  vancomycin (VANCOREADY) IVPB 1500 mg/300 mL        1,500 mg 150 mL/hr over 120 Minutes Intravenous Every 12 hours 10/03/21 0613 10/10/21 0025   10/03/21 0700  vancomycin (VANCOREADY) IVPB 2000 mg/400 mL        2,000 mg 200 mL/hr over 120 Minutes Intravenous  Once 10/03/21 0613 10/03/21 1149   10/03/21 0700  ceFEPIme (MAXIPIME) 2 g in sodium chloride 0.9 % 100 mL IVPB  Status:  Discontinued        2 g 200 mL/hr over 30 Minutes Intravenous Every 8 hours 10/03/21 0613 10/06/21 1329   10/03/21 0645  vancomycin (VANCOREADY) IVPB 2000 mg/400 mL  Status:  Discontinued        2,000 mg 200 mL/hr over 120 Minutes Intravenous  Once 10/03/21 0549 10/03/21 0748   10/03/21 0645  ceFEPIme (MAXIPIME) 2 g in sodium chloride 0.9 % 100 mL IVPB  Status:  Discontinued        2 g 200 mL/hr over 30 Minutes Intravenous  Once 10/03/21 0549 10/03/21 1121   10/03/21 0600  vancomycin (VANCOREADY) IVPB 1500 mg/300 mL  Status:  Discontinued        1,500 mg 150 mL/hr over 120 Minutes Intravenous On call 10/02/21 1334 10/03/21 0549   09/11/21 1100  cefTRIAXone (ROCEPHIN) 2 g in sodium chloride 0.9 % 100 mL IVPB        2 g 200 mL/hr over 30 Minutes Intravenous Every 24 hours 09/11/21 1010  09/13/21 1108   09/08/21 0200  piperacillin-tazobactam (ZOSYN) IVPB 3.375 g  Status:  Discontinued        3.375 g 12.5 mL/hr over 240 Minutes Intravenous Every 8 hours 09/07/21 1856 09/07/21 2114   09/07/21 2200   meropenem (MERREM) 1 g in sodium chloride 0.9 % 100 mL IVPB  Status:  Discontinued        1 g 200 mL/hr over 30 Minutes Intravenous Every 8 hours 09/07/21 2114 09/11/21 1010   09/07/21 1945  piperacillin-tazobactam (ZOSYN) IVPB 3.375 g        3.375 g 100 mL/hr over 30 Minutes Intravenous  Once 09/07/21 1856 09/07/21 2005   09/07/21 1753  cefTRIAXone (ROCEPHIN) injection         Intravenous As needed 09/07/21 1754 09/07/21 1753   09/07/21 1749  sodium chloride 0.9 % with cefTRIAXone (ROCEPHIN) ADS Med       Note to Pharmacy: Eveline Keto E: cabinet override      09/07/21 1749 09/07/21 1935        Subjective: Patient seen and examined.  Wife at the bedside.  Patient appears to be significantly weak and deconditioned.  He is able to lift both lower extremities as well as left upper extremity but is having hard time lifting right upper extremity due to weakness.    Objective: Vitals:   10/14/21 2016 10/14/21 2213 10/15/21 0442 10/15/21 0820  BP:  120/72 105/70   Pulse:  (!) 104 97 (!) 102  Resp: 19 20 18  (!) 24  Temp:  98 F (36.7 C) 98.1 F (36.7 C)   TempSrc:  Oral Oral   SpO2: 99% 100% 100% 100%  Weight:      Height:        Intake/Output Summary (Last 24 hours) at 10/15/2021 1000 Last data filed at 10/15/2021 0600 Gross per 24 hour  Intake 0 ml  Output 1700 ml  Net -1700 ml    Filed Weights   10/09/21 0348 10/10/21 0333 10/10/21 1048  Weight: 126.5 kg 120.9 kg 128.9 kg    Examination:  General exam: Appears calm and comfortable  Respiratory system: Slight diminished breath sounds due to poor inspiratory effort. Currently on 5 L oxygen via tracheostomy. Cardiovascular system: S1 & S2 heard, RRR. No JVD, murmurs, rubs, gallops or clicks. No pedal edema. Gastrointestinal system: Abdomen is nondistended, soft and nontender. No organomegaly or masses felt. Normal bowel sounds heard. Central nervous system: Alert and likely oriented.  Weakness in the right upper  extremity, some weakness in the left upper extremity. Extremities: Symmetric 5 x 5 power. Skin: No rashes, lesions or ulcers.   Data Reviewed: I have personally reviewed following labs and imaging studies  CBC: Recent Labs  Lab 10/11/21 0304 10/12/21 0312 10/13/21 0306 10/14/21 0237 10/15/21 0312  WBC 6.0 5.6 5.9 6.3 6.4  NEUTROABS 4.6 4.2 4.4 4.6 4.4  HGB 7.1* 7.3* 7.3* 7.6* 7.6*  HCT 23.9* 24.3* 24.5* 24.2* 25.5*  MCV 94.5 94.2 94.6 92.7 95.1  PLT 148* 176 197 193 [id number]    Basic Metabolic Panel: Recent Labs  Lab 10/11/21 0304 10/12/21 0312 10/13/21 0306 10/13/21 1400 10/14/21 0237 10/15/21 0312  NA 139 136 142 141  --  140  K 3.6 3.5 3.5 3.8  --  3.7  CL 110 103 106 107  --  105  CO2 22 24 27 28   --  27  GLUCOSE 216* 235* 113* 164*  --  117*  BUN 8 6 8 10   --  14  CREATININE 0.58* 0.45* 0.49* 0.52*  --  0.51*  CALCIUM 8.5* 8.5* 9.0 8.5*  --  8.8*  MG 1.8 1.9 1.9  --  1.8 1.9  PHOS 2.9 3.4 3.7  --  3.7 3.3    GFR: Estimated Creatinine Clearance: 138 mL/min (A) (by C-G formula based on SCr of 0.51 mg/dL (L)). Liver Function Tests: Recent Labs  Lab 10/09/21 0334 10/10/21 0317 10/11/21 0304 10/12/21 0312  AST 57* 40 26 28  ALT 81* 75* 34 37  ALKPHOS 69 71 37* 47  BILITOT 0.8 0.6 0.8 0.6  PROT 4.7* 4.6* 4.7* 4.8*  ALBUMIN 3.7 3.3* 3.7 3.6    No results for input(s): "LIPASE", "AMYLASE" in the last 168 hours.  No results for input(s): "AMMONIA" in the last 168 hours. Coagulation Profile: No results for input(s): "INR", "PROTIME" in the last 168 hours.  Cardiac Enzymes: No results for input(s): "CKTOTAL", "CKMB", "CKMBINDEX", "TROPONINI" in the last 168 hours. BNP (last 3 results) No results for input(s): "PROBNP" in the last 8760 hours. HbA1C: No results for input(s): "HGBA1C" in the last 72 hours. CBG: Recent Labs  Lab 10/14/21 1606 10/14/21 2004 10/15/21 0015 10/15/21 0422 10/15/21 0736  GLUCAP 141* 158* 179* 123* 85    Lipid Profile: No  results for input(s): "CHOL", "HDL", "LDLCALC", "TRIG", "CHOLHDL", "LDLDIRECT" in the last 72 hours. Thyroid Function Tests: No results for input(s): "TSH", "T4TOTAL", "FREET4", "T3FREE", "THYROIDAB" in the last 72 hours. Anemia Panel: No results for input(s): "VITAMINB12", "FOLATE", "FERRITIN", "TIBC", "IRON", "RETICCTPCT" in the last 72 hours. Sepsis Labs: No results for input(s): "PROCALCITON", "LATICACIDVEN" in the last 168 hours.   No results found for this or any previous visit (from the past 240 hour(s)).        Radiology Studies: No results found.      Scheduled Meds:  Chlorhexidine Gluconate Cloth  6 each Topical Daily   doxazosin  2 mg Per Tube Daily   feeding supplement (PROSource TF20)  60 mL Per Tube BID   free water  250 mL Per Tube Q6H   influenza vac split quadrivalent PF  0.5 mL Intramuscular Tomorrow-1000   insulin aspart  0-15 Units Subcutaneous Q4H   insulin aspart  10 Units Subcutaneous Q4H   insulin glargine-yfgn  40 Units Subcutaneous BID   nutrition supplement (JUVEN)  1 packet Per Tube BID   mouth rinse  15 mL Mouth Rinse 4 times per day   pantoprazole  40 mg Per Tube BID   pneumococcal 23 valent vaccine  0.5 mL Intramuscular Tomorrow-1000   rosuvastatin  40 mg Per Tube Daily   senna-docusate  1 tablet Per Tube BID   sodium chloride flush  10-40 mL Intracatheter Q12H   sodium chloride flush  5 mL Intracatheter Q8H   Zinc Oxide   Topical BID   Continuous Infusions:  sodium chloride 10 mL/hr at 10/10/21 1800   sodium chloride 250 mL (10/11/21 [id number])   sodium chloride Stopped (10/07/21 1454)   dextrose Stopped (10/04/21 1008)   dextrose     feeding supplement (VITAL 1.5 CAL) 1,000 mL (10/15/21 0433)   lactated ringers      Darliss Cheney, MD [facility] 10/15/2021, 10:00 AM

## 2021-10-15 NOTE — Progress Notes (Signed)
Occupational Therapy Treatment Patient Details Name: Troy Randall MRN: [id number] DOB: 1960-10-10 Today's Date: 10/15/2021   History of present illness Patient is a 61 y/o male who presents on 09/04/21 with dark stools, dizziness and SOB. Found to have GI bleed and acute anemia s/p embolization 09/06/21 and percutaneous cholecystostomy 09/07/21. Intubated 9/3, CRRT 9/5-9/8. Hemorrhagic and septic shock 9/5. Trach placed 9/11. 9/16 MRI rapid development numerous round white matter lesions in both cerebral hemispheres; 9/17 MRI: numerous white matter lesions consistent with active demyelination - concerning for MS, ADEM, and cerebral vasculitis; 9/18 Lumbar puncture. Pt returned to ICU on 9/29 with aspiration PNA, tachypnea, tachycardia and increased WOB. 10/2 - pt transferred back to [facility]; started having GIB. Underwent sigmoidoscopy on 10/07/2021. Plasma exchange started. PMH includes CVA, DM, HTN.   OT comments  Patient received in supine and instructed on rolling to right to get to EOB with patient attempting to participate but required max assist +2.  Patient able to maintain sitting balance on EOB with min guard to supervision with LUE support with rail. Patient stood from EOB into [place] with mod/max assist of 2.  Patient rested on Stedy pads and stated he felt dizzy with BP at 93/54.  Patient stood again and lowered to EOB. Patient remained on EOB before returning to supine. Patient is making good gains and would benefit from further OT treatment. Acute OT to continue to follow.    Recommendations for follow up therapy are one component of a multi-disciplinary discharge planning process, led by the attending physician.  Recommendations may be updated based on patient status, additional functional criteria and insurance authorization.    Follow Up Recommendations  OT at Long-term acute care hospital    Assistance Recommended at Discharge Frequent or constant Supervision/Assistance  Patient can return  home with the following  Two people to help with walking and/or transfers;Two people to help with bathing/dressing/bathroom;Direct supervision/assist for medications management;Direct supervision/assist for financial management;Assist for transportation;Help with stairs or ramp for entrance;Assistance with feeding;Assistance with cooking/housework   Equipment Recommendations  Other (comment) (defer to next venue)    Recommendations for Other Services      Precautions / Restrictions Precautions Precautions: Fall Precaution Comments: trach collar, cortrak, R flank drain Restrictions Weight Bearing Restrictions: No       Mobility Bed Mobility Overal bed mobility: Needs Assistance Bed Mobility: Rolling, Sidelying to Sit, Sit to Sidelying Rolling: Mod assist Sidelying to sit: Max assist, +2 for safety/equipment     Sit to sidelying: +2 for physical assistance, Max assist General bed mobility comments: Patient attempting to assist wth getting to EOB with difficulty following directions    Transfers Overall transfer level: Needs assistance Equipment used: Ambulation equipment used Transfers: Sit to/from Stand Sit to Stand: Mod assist, Max assist, +2 physical assistance           General transfer comment: Patient performd stand from EOB into stedy with mod/max assist +2 and tolerated standing but indicated he wanted to sit. Stood a second time and decended to EOB due to patient indicating he felt dizzy Transfer via Lift Equipment: Stedy   Balance Overall balance assessment: Needs assistance Sitting-balance support: Feet supported, Bilateral upper extremity supported Sitting balance-Leahy Scale: Fair Sitting balance - Comments: min guard to supervision for sitting balance with one extremtity support   Standing balance support: Bilateral upper extremity supported Standing balance-Leahy Scale: Poor Standing balance comment: stood x2 into [place] with mod/max assist of 2  ADL either performed or assessed with clinical judgement   ADL Overall ADL's : Needs assistance/impaired                     Lower Body Dressing: Total assistance Lower Body Dressing Details (indicate cue type and reason): to donn socks               General ADL Comments: focused on EOB sitting balance and standing in stedy    Extremity/Trunk Assessment              Vision       Perception     Praxis      Cognition Arousal/Alertness: Awake/alert Behavior During Therapy: Flat affect Overall Cognitive Status: Impaired/Different from baseline Area of Impairment: Following commands, Attention, Safety/judgement                   Current Attention Level: Focused   Following Commands: Follows one step commands with increased time, Follows one step commands inconsistently Safety/Judgement: Decreased awareness of safety     General Comments: indicated he understood questions with hand gestures 50% of time.        Exercises      Shoulder Instructions       General Comments      Pertinent Vitals/ Pain       Pain Assessment Pain Assessment: Faces Faces Pain Scale: Hurts a little bit Pain Location: generalized Pain Descriptors / Indicators: Grimacing, Discomfort Pain Intervention(s): Limited activity within patient's tolerance, Monitored during session, Repositioned  Home Living                                          Prior Functioning/Environment              Frequency  Min 2X/week        Progress Toward Goals  OT Goals(current goals can now be found in the care plan section)  Progress towards OT goals: Progressing toward goals  Acute Rehab OT Goals OT Goal Formulation: With family Time For Goal Achievement: 10/15/21 Potential to Achieve Goals: Fair ADL Goals Pt/caregiver will Perform Home Exercise Program: Increased strength;Both right and left upper extremity;With minimal  assist Additional ADL Goal #1: Pt will follow simple commands within 10 seconds of request with 30% accuracy. Additional ADL Goal #2: Pt will demonstrate fair sitting balance at EOB x 5 minutes as a precursor to ADLs. Additional ADL Goal #3: Pt will roll with moderate assistance for pressure relief and pericare.  Plan Discharge plan remains appropriate    Co-evaluation                 AM-PAC OT "6 Clicks" Daily Activity     Outcome Measure   Help from another person eating meals?: Total Help from another person taking care of personal grooming?: Total Help from another person toileting, which includes using toliet, bedpan, or urinal?: Total Help from another person bathing (including washing, rinsing, drying)?: Total Help from another person to put on and taking off regular upper body clothing?: Total Help from another person to put on and taking off regular lower body clothing?: Total 6 Click Score: 6    End of Session Equipment Utilized During Treatment: Oxygen;Other (comment) Antony Salmon)  OT Visit Diagnosis: Muscle weakness (generalized) (M62.81);Other symptoms and signs involving cognitive function;Low vision, both eyes (H54.2)   Activity Tolerance Patient tolerated treatment  well   Patient Left in bed;with call bell/phone within reach;with family/visitor present;with bed alarm set   Nurse Communication Mobility status        Time: 1020-1049 OT Time Calculation (min): 29 min  Charges: OT General Charges $OT Visit: 1 Visit OT Treatments $Therapeutic Activity: 23-37 mins  Alfonse Flavors, OTA Acute Rehabilitation Services  Office [phone]   Dewain Penning 10/15/2021, 2:25 PM

## 2021-10-15 NOTE — Progress Notes (Signed)
Pt completed TPE treatment. Pt had hypotension 87/53 during [place]. Pt send to his room at stable condition. Report given to Malachi Bonds RN

## 2021-10-15 NOTE — Plan of Care (Signed)
  Problem: Education: Goal: Knowledge of General Education information will improve Description: Including pain rating scale, medication(s)/side effects and non-pharmacologic comfort measures Outcome: Progressing   Problem: Health Behavior/Discharge Planning: Goal: Ability to manage health-related needs will improve Outcome: Progressing   Problem: Clinical Measurements: Goal: Ability to maintain clinical measurements within normal limits will improve Outcome: Progressing Goal: Will remain free from infection Outcome: Progressing Goal: Diagnostic test results will improve Outcome: Progressing Goal: Respiratory complications will improve Outcome: Progressing Goal: Cardiovascular complication will be avoided Outcome: Progressing   Problem: Activity: Goal: Risk for activity intolerance will decrease Outcome: Progressing   Problem: Nutrition: Goal: Adequate nutrition will be maintained Outcome: Progressing   Problem: Coping: Goal: Level of anxiety will decrease Outcome: Progressing   Problem: Elimination: Goal: Will not experience complications related to bowel motility Outcome: Progressing Goal: Will not experience complications related to urinary retention Outcome: Progressing   Problem: Pain Managment: Goal: General experience of comfort will improve Outcome: Progressing   Problem: Safety: Goal: Ability to remain free from injury will improve Outcome: Progressing   Problem: Skin Integrity: Goal: Risk for impaired skin integrity will decrease Outcome: Progressing   Problem: Education: Goal: Ability to describe self-care measures that may prevent or decrease complications (Diabetes Survival Skills Education) will improve Outcome: Progressing Goal: Individualized Educational Video(s) Outcome: Progressing   Problem: Coping: Goal: Ability to adjust to condition or change in health will improve Outcome: Progressing   Problem: Fluid Volume: Goal: Ability to  maintain a balanced intake and output will improve Outcome: Progressing   Problem: Health Behavior/Discharge Planning: Goal: Ability to identify and utilize available resources and services will improve Outcome: Progressing Goal: Ability to manage health-related needs will improve Outcome: Progressing   Problem: Metabolic: Goal: Ability to maintain appropriate glucose levels will improve Outcome: Progressing   Problem: Nutritional: Goal: Maintenance of adequate nutrition will improve Outcome: Progressing Goal: Progress toward achieving an optimal weight will improve Outcome: Progressing   Problem: Skin Integrity: Goal: Risk for impaired skin integrity will decrease Outcome: Progressing   Problem: Tissue Perfusion: Goal: Adequacy of tissue perfusion will improve Outcome: Progressing   Problem: Education: Goal: Understanding of CV disease, CV risk reduction, and recovery process will improve Outcome: Progressing Goal: Individualized Educational Video(s) Outcome: Progressing   Problem: Activity: Goal: Ability to return to baseline activity level will improve Outcome: Progressing   Problem: Cardiovascular: Goal: Ability to achieve and maintain adequate cardiovascular perfusion will improve Outcome: Progressing Goal: Vascular access site(s) Level 0-1 will be maintained Outcome: Progressing   Problem: Health Behavior/Discharge Planning: Goal: Ability to safely manage health-related needs after discharge will improve Outcome: Progressing   Problem: Education: Goal: Knowledge about tracheostomy care/management will improve Outcome: Progressing   Problem: Activity: Goal: Ability to tolerate increased activity will improve Outcome: Progressing   Problem: Health Behavior/Discharge Planning: Goal: Ability to manage tracheostomy will improve Outcome: Progressing   Problem: Respiratory: Goal: Patent airway maintenance will improve Outcome: Progressing   Problem: Role  Relationship: Goal: Ability to communicate will improve Outcome: Progressing   

## 2021-10-15 NOTE — Progress Notes (Signed)
RT in ED at 2000 and unavailable for check.

## 2021-10-15 NOTE — Progress Notes (Signed)
Speech Language Pathology Treatment: [name] Speaking valve  Patient Details Name: DREVION OFFORD MRN: [id number] DOB: 1960/07/24 Today's Date: 10/15/2021 Time: 7616-0737 SLP Time Calculation (min) (ACUTE ONLY): 10 min  Assessment / Plan / Recommendation Clinical Impression  Patient seen by SLP for skilled treatment focused on PMV usage and ability to communicate. Session was brief due to patient getting prepared for transport for procedure/intervention. SLP donned PMV which patient tolerated without change in vitals. He was not able to imitate or respond to brother who asked him to try to say his name, but he did exhibit two instances of spontaneous verbalizations with vocal intensity low but voice clear; one time responding to SLP when asked how he was feeling and patient saying, "alright" and the other time patient verbalizing but SLP unable to discern what he was saying. Patient continues to make slow progress and SLP will continue to follow for PMV usage and ability to initiate communication as well as readiness for PO trials.     HPI HPI: Pt is a 61 y.o. male with history of stroke in 4/23, HTN, HLD and DM with recent severe jejunal bleed s/p vessel coiling who is being seen for altered mental status and concerning white matter changes on MRI. Patient's wife reports that after his stroke in 4/23 he made a good recovery and was able to perform ADLs. He had some residual dysarthria. Pt presented to [facility] on 8/31 with GI bleeding and has remained persistently altered. Intubated 9/3 and trach placed 9/11. He currently has #6 cuffed trach tolerating cuff deflated.      SLP Plan  Continue with current plan of care      Recommendations for follow up therapy are one component of a multi-disciplinary discharge planning process, led by the attending physician.  Recommendations may be updated based on patient status, additional functional criteria and insurance authorization.     Recommendations         Patient may use Passy-Muir Speech Valve: During all therapies with supervision PMSV Supervision: Full MD: Please consider changing trach tube to : Cuffless         Oral Care Recommendations: Oral care QID Follow Up Recommendations: SLP at Long-term acute care hospital Assistance recommended at discharge: Frequent or constant Supervision/Assistance SLP Visit Diagnosis: Aphonia (R49.1) Plan: Continue with current plan of care          Sonia Baller, MA, CCC-SLP Speech Therapy

## 2021-10-16 ENCOUNTER — Inpatient Hospital Stay (HOSPITAL_COMMUNITY): Payer: Medicare HMO

## 2021-10-16 DIAGNOSIS — G9341 Metabolic encephalopathy: Secondary | ICD-10-CM | POA: Diagnosis not present

## 2021-10-16 LAB — CBC WITH DIFFERENTIAL/PLATELET
Abs Immature Granulocytes: 0.02 10*3/uL (ref 0.00–0.07)
Basophils Absolute: 0 10*3/uL (ref 0.0–0.1)
Basophils Relative: 1 %
Eosinophils Absolute: 0.2 10*3/uL (ref 0.0–0.5)
Eosinophils Relative: 6 %
HCT: 25.2 % — ABNORMAL LOW (ref 39.0–52.0)
Hemoglobin: 7.7 g/dL — ABNORMAL LOW (ref 13.0–17.0)
Immature Granulocytes: 1 %
Lymphocytes Relative: 29 %
Lymphs Abs: 1.1 10*3/uL (ref 0.7–4.0)
MCH: 28.8 pg (ref 26.0–34.0)
MCHC: 30.6 g/dL (ref 30.0–36.0)
MCV: 94.4 fL (ref 80.0–100.0)
Monocytes Absolute: 0.4 10*3/uL (ref 0.1–1.0)
Monocytes Relative: 9 %
Neutro Abs: 2.1 10*3/uL (ref 1.7–7.7)
Neutrophils Relative %: 54 %
Platelets: 220 10*3/uL (ref 150–400)
RBC: 2.67 MIL/uL — ABNORMAL LOW (ref 4.22–5.81)
RDW: 17 % — ABNORMAL HIGH (ref 11.5–15.5)
WBC: 3.8 10*3/uL — ABNORMAL LOW (ref 4.0–10.5)
nRBC: 0 % (ref 0.0–0.2)

## 2021-10-16 LAB — GLUCOSE, CAPILLARY
Glucose-Capillary: 102 mg/dL — ABNORMAL HIGH (ref 70–99)
Glucose-Capillary: 129 mg/dL — ABNORMAL HIGH (ref 70–99)
Glucose-Capillary: 136 mg/dL — ABNORMAL HIGH (ref 70–99)
Glucose-Capillary: 137 mg/dL — ABNORMAL HIGH (ref 70–99)
Glucose-Capillary: 152 mg/dL — ABNORMAL HIGH (ref 70–99)
Glucose-Capillary: 167 mg/dL — ABNORMAL HIGH (ref 70–99)

## 2021-10-16 LAB — PHOSPHORUS: Phosphorus: 3.6 mg/dL (ref 2.5–4.6)

## 2021-10-16 LAB — HEPATITIS PANEL, ACUTE
HCV Ab: NONREACTIVE
Hep A IgM: NONREACTIVE
Hep B C IgM: NONREACTIVE
Hepatitis B Surface Ag: NONREACTIVE

## 2021-10-16 LAB — MAGNESIUM: Magnesium: 2 mg/dL (ref 1.7–2.4)

## 2021-10-16 NOTE — Progress Notes (Signed)
Request for gastrostomy tube placement received. After review, Dr. Kathlene Cote states last CT Abd 09/10/21 shows bowel over stomach. He requests new CT abd for better review of anatomy. CT Abd ordered.     Narda Rutherford, AGNP-BC 10/16/2021, 11:22 AM

## 2021-10-16 NOTE — Plan of Care (Signed)
  Problem: Health Behavior/Discharge Planning: Goal: Ability to manage health-related needs will improve Outcome: Not Progressing   Problem: Clinical Measurements: Goal: Ability to maintain clinical measurements within normal limits will improve Outcome: Not Progressing Goal: Will remain free from infection Outcome: Not Progressing Goal: Diagnostic test results will improve Outcome: Not Progressing Goal: Respiratory complications will improve Outcome: Not Progressing Goal: Cardiovascular complication will be avoided Outcome: Not Progressing   Problem: Education: Goal: Knowledge of General Education information will improve Description: Including pain rating scale, medication(s)/side effects and non-pharmacologic comfort measures Outcome: Not Progressing

## 2021-10-16 NOTE — Progress Notes (Signed)
PROGRESS NOTE    Troy Randall  JSE:[id number] DOB: [birth year] DOA: 09/04/2021 PCP: [name], MD   Brief Narrative:  61 years old male with medical history of hypertension, hyperlipidemia, type 2 diabetes, CVA, presented to the hospital with GI bleed.  Patient underwent IR embolization followed by percutaneous cholecystostomy tube placement due to ongoing altered mental status.  MRI of the brain showed numerous contrast-enhancing bilateral white matter lesions concerning for demyelinating disease.  Neurology was consulted and underwent lumbar puncture on 09/22/2021 with low suspicion for infectious etiology.  Neurology has initiated the patient on high-dose steroids on 09/23/2021.  Patient was then transferred to hospitalist service on 09/24/2021.  Significant events. 8/31 presented to [facility] ED, [name] Admit, GI Consult 9/1 CTA> no evidence of active GI bleed, 2 U PRBC 9/2 2 U PRBC, colonoscopy > no active bleed found but large clots throughout entire colon. EGD> small hiatal hernia, erosive gastropathy with no stigmata of recent bleeding, and non-bleeding duodenal diverticulum.   Underwent then small bowel enteroscopy which noted intermittent blood pumping in the proximal jejunum but was unable to be reached despite multiple attempts, was injected and clip placed. Repeat CTA repeated, which showed positive jejunal diverticular bleeding. IR> active extrav at [facility] territory, jejunal arcade branch, successful coil embo 9/3 Intubated. CTA> no evidence of active GI bleeding.  New finding of abnormal gallbladder with nondependent air, gallbladder wall air, pericholecystic and right upper quadrant information.  IR placed percutaneous cholecystotomy tube.  9/4 R. IJ central line placed with dialysis triple lumen catheter 9/5 CRRT initiated 9/6 Bedside EGD without any evidence of bleeding. CTA a/p without etiology for bleed 9/8 CRRT discontinued. Required ETT exchange yesterday due to blown cuff. 9/11  Bronchoscopic guided percutaneous tracheostomy placement.  Right IJ central line removed. 9/15 flexible sigmoidoscopy with removal of endoscopy 9/16 MRI brain w/o contrast amended study secondary to range of motion.  Rapid development numerous round white matter lesions in both cerebral hemispheres.  Prior MRIs. 9/17 MRI brain: Numerous contrast-enhancing bilateral white matter lesions, consistent with active demyelination. 9/18 Lumbar puncture  9/19 MRI cervical spine, thoracic : Limited exam, within limitations there is not definitive evidence of active demyelinization. Within limitations of motion artifact, there may be demyelinating lesions at the C6 and T1 vertebral body levels. 9/25-clinical exam not improved despite completion of 5-day IV steroids.  Neurology planning to repeat MRI of the brain cervical spine thoracic spine. Possible IR consult for catheter placement for possible Plex therapy if abnormal scans 9/26- scans attempted with ativan 9/29-patient transferred to ICU for respiratory distress due to aspiration pneumonia with sepsis 10/2-patient transferred back to [facility]; started having GI bleed, Dr. Mann/Dr. Benson Norway reconsulted.  Underwent sigmoidoscopy on 10/07/2021. 10/2: Plasma exchange started  Assessment & Plan:   Acute metabolic encephalopathy --MRI brain showed bilateral white matter lesions concerning for active demyelinating disease -S/p IV steroids for 5 days with no significant improvement -Currently undergoing plasma exchange as per neurology, patient received 5 doses, last 1 on 10/15/2021.Marland Kitchen  No further recommendations from neurology.  Hemorrhagic shock/GI bleed -Patient has had multiple endoscopic procedures last month including EGD/capsule endoscopy/coil embolization of jejunal branch of SMA. -Possibly from stress ulceration/ischemic disease as per GI: Status post sigmoidoscopy on 10/07/2021 which showed single solitary ulcer in the sigmoid colon which was biopsied and  pathology shows chronic colitis. -Has had multiple packed red cell transfusion during this hospitalization. -Monitor H&H.  Hemoglobin has remained stable now between 7 and 8.  Transfuse packed red  cells if hemoglobin is less than 7.  Acute respiratory failure with hypoxia status post tracheostomy Aspiration pneumonia -Status post tracheostomy placement on 09/15/2020.  PCCM following weekly. -Patient had acute aspiration event on 10/03/2021 and was placed on broad-spectrum antibiotics: Trach cultures grew MRSA and E. coli.  Treated with Rocephin and vancomycin and completed therapy.  E. coli cholecystitis status post percutaneous cholecystostomy drain placement -Had percutaneous cholecystostomy drain placement on 09/07/2021.  Subsequently had completed course of antibiotics -Will eventually need outpatient surgery/IR follow-up  Transaminitis -Resolved.  Thrombocytopenia -Resolved.  Diabetes mellitus type 2 with hyperglycemia -Continue long acting insulin along with CBGs with SSI  Acute kidney injury: Resolved -CRRT discontinued on 09/12/2021  Hypokalemia--improved  Hypophosphatemia -Improved  Hypertension -Continue monitoring blood pressure.  Currently on doxazosin   Obesity -Outpatient follow-up  Stage II medial sacral ulcer: Not present on admission -Continue local wound care.  Acute urinary retention -Foley catheter removed on 10/13/2021.  Subsequently, he has had issues with retention.  We will try straight in and out cath: If still continues to have issues with retention, will have to place Foley catheter again. -Continue doxazosin  Moderate protein calorie malnutrition/poor p.o. intake: Patient on tube feed currently.  Nutrition recommends PEG tube placement.  DVT prophylaxis: SCDs Code Status: Full Family Communication: Wife at bedside  Disposition Plan: Status is: Inpatient Remains inpatient appropriate because: Medically stable.  Pending placement to  LTAC.    Consultants: GI/neurology/PCCM/general surgery/nephrology/IR  Procedures: As above  Antimicrobials:  Anti-infectives (From admission, onward)    Start     Dose/Rate Route Frequency Ordered Stop   10/06/21 1515  cefTRIAXone (ROCEPHIN) 2 g in sodium chloride 0.9 % 100 mL IVPB        2 g 200 mL/hr over 30 Minutes Intravenous Every 24 hours 10/06/21 1420 10/09/21 1756   10/04/21 0945  metroNIDAZOLE (FLAGYL) IVPB 500 mg  Status:  Discontinued        500 mg 100 mL/hr over 60 Minutes Intravenous Every 12 hours 10/04/21 0853 10/06/21 1329   10/03/21 2200  vancomycin (VANCOREADY) IVPB 1500 mg/300 mL        1,500 mg 150 mL/hr over 120 Minutes Intravenous Every 12 hours 10/03/21 0613 10/10/21 0025   10/03/21 0700  vancomycin (VANCOREADY) IVPB 2000 mg/400 mL        2,000 mg 200 mL/hr over 120 Minutes Intravenous  Once 10/03/21 0613 10/03/21 1149   10/03/21 0700  ceFEPIme (MAXIPIME) 2 g in sodium chloride 0.9 % 100 mL IVPB  Status:  Discontinued        2 g 200 mL/hr over 30 Minutes Intravenous Every 8 hours 10/03/21 0613 10/06/21 1329   10/03/21 0645  vancomycin (VANCOREADY) IVPB 2000 mg/400 mL  Status:  Discontinued        2,000 mg 200 mL/hr over 120 Minutes Intravenous  Once 10/03/21 0549 10/03/21 0748   10/03/21 0645  ceFEPIme (MAXIPIME) 2 g in sodium chloride 0.9 % 100 mL IVPB  Status:  Discontinued        2 g 200 mL/hr over 30 Minutes Intravenous  Once 10/03/21 0549 10/03/21 1121   10/03/21 0600  vancomycin (VANCOREADY) IVPB 1500 mg/300 mL  Status:  Discontinued        1,500 mg 150 mL/hr over 120 Minutes Intravenous On call 10/02/21 1334 10/03/21 0549   09/11/21 1100  cefTRIAXone (ROCEPHIN) 2 g in sodium chloride 0.9 % 100 mL IVPB        2 g 200 mL/hr over 30  Minutes Intravenous Every 24 hours 09/11/21 1010 09/13/21 1108   09/08/21 0200  piperacillin-tazobactam (ZOSYN) IVPB 3.375 g  Status:  Discontinued        3.375 g 12.5 mL/hr over 240 Minutes Intravenous Every 8 hours  09/07/21 1856 09/07/21 2114   09/07/21 2200  meropenem (MERREM) 1 g in sodium chloride 0.9 % 100 mL IVPB  Status:  Discontinued        1 g 200 mL/hr over 30 Minutes Intravenous Every 8 hours 09/07/21 2114 09/11/21 1010   09/07/21 1945  piperacillin-tazobactam (ZOSYN) IVPB 3.375 g        3.375 g 100 mL/hr over 30 Minutes Intravenous  Once 09/07/21 1856 09/07/21 2005   09/07/21 1753  cefTRIAXone (ROCEPHIN) injection         Intravenous As needed 09/07/21 1754 09/07/21 1753   09/07/21 1749  sodium chloride 0.9 % with cefTRIAXone (ROCEPHIN) ADS Med       Note to Pharmacy: Eveline Keto E: cabinet override      09/07/21 1749 09/07/21 1935        Subjective: Patient seen and examined.  Wife at the bedside.  Unable to talk much due to tracheostomy.  Continues to have weakness in bilateral upper extremities .   Objective: Vitals:   10/16/21 0502 10/16/21 0724 10/16/21 0849 10/16/21 0900  BP: 115/66 115/66 115/63 115/63  Pulse: 89 (!) 104  92  Resp: 19 (!) 21  20  Temp: 98.5 F (36.9 C)   98 F (36.7 C)  TempSrc: Oral   Oral  SpO2: 99% 98%  98%  Weight:      Height:        Intake/Output Summary (Last 24 hours) at 10/16/2021 0944 Last data filed at 10/16/2021 0500 Gross per 24 hour  Intake 470 ml  Output 850 ml  Net -380 ml    Filed Weights   10/10/21 1048 10/15/21 1344 10/15/21 1625  Weight: 128.9 kg 125.4 kg 125.4 kg    Examination:  General exam: Appears calm and comfortable  Respiratory system: Clear to auscultation. Respiratory effort normal. Cardiovascular system: S1 & S2 heard, RRR. No JVD, murmurs, rubs, gallops or clicks. No pedal edema. Gastrointestinal system: Abdomen is nondistended, soft and nontender. No organomegaly or masses felt. Normal bowel sounds heard. Central nervous system: Alert, unable to assess orientation since he is not talking much.  Has weakness in bilateral upper extremities.  Weakness in the left lower extremity as well.   Data Reviewed: I  have personally reviewed following labs and imaging studies  CBC: Recent Labs  Lab 10/12/21 0312 10/13/21 0306 10/14/21 0237 10/15/21 0312 10/16/21 0353  WBC 5.6 5.9 6.3 6.4 3.8*  NEUTROABS 4.2 4.4 4.6 4.4 2.1  HGB 7.3* 7.3* 7.6* 7.6* 7.7*  HCT 24.3* 24.5* 24.2* 25.5* 25.2*  MCV 94.2 94.6 92.7 95.1 94.4  PLT 176 197 193 239 [id number]    Basic Metabolic Panel: Recent Labs  Lab 10/11/21 0304 10/12/21 0312 10/13/21 0306 10/13/21 1400 10/14/21 0237 10/15/21 0312 10/16/21 0353  NA 139 136 142 141  --  140  --   K 3.6 3.5 3.5 3.8  --  3.7  --   CL 110 103 106 107  --  105  --   CO2 22 24 27 28   --  27  --   GLUCOSE 216* 235* 113* 164*  --  117*  --   BUN 8 6 8 10   --  14  --   CREATININE 0.58*  0.45* 0.49* 0.52*  --  0.51*  --   CALCIUM 8.5* 8.5* 9.0 8.5*  --  8.8*  --   MG 1.8 1.9 1.9  --  1.8 1.9 2.0  PHOS 2.9 3.4 3.7  --  3.7 3.3 3.6    GFR: Estimated Creatinine Clearance: 136 mL/min (A) (by C-G formula based on SCr of 0.51 mg/dL (L)). Liver Function Tests: Recent Labs  Lab 10/10/21 0317 10/11/21 0304 10/12/21 0312  AST 40 26 28  ALT 75* 34 37  ALKPHOS 71 37* 47  BILITOT 0.6 0.8 0.6  PROT 4.6* 4.7* 4.8*  ALBUMIN 3.3* 3.7 3.6    No results for input(s): "LIPASE", "AMYLASE" in the last 168 hours.  No results for input(s): "AMMONIA" in the last 168 hours. Coagulation Profile: No results for input(s): "INR", "PROTIME" in the last 168 hours.  Cardiac Enzymes: No results for input(s): "CKTOTAL", "CKMB", "CKMBINDEX", "TROPONINI" in the last 168 hours. BNP (last 3 results) No results for input(s): "PROBNP" in the last 8760 hours. HbA1C: No results for input(s): "HGBA1C" in the last 72 hours. CBG: Recent Labs  Lab 10/15/21 1236 10/15/21 2012 10/15/21 2326 10/16/21 0452 10/16/21 0824  GLUCAP 156* 181* 211* 129* 102*    Lipid Profile: No results for input(s): "CHOL", "HDL", "LDLCALC", "TRIG", "CHOLHDL", "LDLDIRECT" in the last 72 hours. Thyroid Function  Tests: No results for input(s): "TSH", "T4TOTAL", "FREET4", "T3FREE", "THYROIDAB" in the last 72 hours. Anemia Panel: No results for input(s): "VITAMINB12", "FOLATE", "FERRITIN", "TIBC", "IRON", "RETICCTPCT" in the last 72 hours. Sepsis Labs: No results for input(s): "PROCALCITON", "LATICACIDVEN" in the last 168 hours.   No results found for this or any previous visit (from the past 240 hour(s)).        Radiology Studies: No results found.      Scheduled Meds:  Chlorhexidine Gluconate Cloth  6 each Topical Daily   doxazosin  2 mg Per Tube Daily   feeding supplement (PROSource TF20)  60 mL Per Tube BID   free water  250 mL Per Tube Q6H   influenza vac split quadrivalent PF  0.5 mL Intramuscular Tomorrow-1000   insulin aspart  0-15 Units Subcutaneous Q4H   insulin aspart  10 Units Subcutaneous Q4H   insulin glargine-yfgn  40 Units Subcutaneous BID   nutrition supplement (JUVEN)  1 packet Per Tube BID   mouth rinse  15 mL Mouth Rinse 4 times per day   pantoprazole  40 mg Per Tube BID   pneumococcal 23 valent vaccine  0.5 mL Intramuscular Tomorrow-1000   rosuvastatin  40 mg Per Tube Daily   senna-docusate  1 tablet Per Tube BID   sodium chloride flush  10-40 mL Intracatheter Q12H   sodium chloride flush  5 mL Intracatheter Q8H   Zinc Oxide   Topical BID   Continuous Infusions:  sodium chloride 10 mL/hr at 10/10/21 1800   sodium chloride 250 mL (10/11/21 0086)   sodium chloride Stopped (10/07/21 1454)   citrate dextrose     dextrose Stopped (10/04/21 1008)   dextrose     feeding supplement (VITAL 1.5 CAL) 1,000 mL (10/15/21 0433)   lactated ringers      Darliss Cheney, MD [facility] 10/16/2021, 9:44 AM

## 2021-10-16 NOTE — Progress Notes (Signed)
Physical Therapy Treatment Patient Details Name: Troy Randall MRN: [id number] DOB: 1960-11-28 Today's Date: 10/16/2021   History of Present Illness Patient is a 61 y/o male who presents on 09/04/21 with dark stools, dizziness and SOB. Found to have GI bleed and acute anemia s/p embolization 09/06/21 and percutaneous cholecystostomy 09/07/21. Intubated 9/3, CRRT 9/5-9/8. Hemorrhagic and septic shock 9/5. Trach placed 9/11. 9/16 MRI rapid development numerous round white matter lesions in both cerebral hemispheres; 9/17 MRI: numerous white matter lesions consistent with active demyelination - concerning for MS, ADEM, and cerebral vasculitis; 9/18 Lumbar puncture. Pt returned to ICU on 9/29 with aspiration PNA, tachypnea, tachycardia and increased WOB. 10/2 - pt transferred back to [facility]; started having GIB. Underwent sigmoidoscopy on 10/07/2021. Plasma exchange started. PMH includes CVA, DM, HTN.    PT Comments    Patient continues to make progress towards physical therapy goals. Alert and attentive to conversations in room during session but unable to follow commands while attention was on conversations in room. Patient able to shake head yes/no to questions asked. Patient required maxA+2 for bed mobility and initial sit to stand in [place] but able to stand from [place] with minA+2 for safety and steadying. Will benefit from closed environment with limited conversation/distractions next session. D/c plan remains appropriate.     Recommendations for follow up therapy are one component of a multi-disciplinary discharge planning process, led by the attending physician.  Recommendations may be updated based on patient status, additional functional criteria and insurance authorization.  Follow Up Recommendations  PT at Long-term acute care hospital     Assistance Recommended at Discharge Frequent or constant Supervision/Assistance  Patient can return home with the following Two people to help with  walking and/or transfers;Two people to help with bathing/dressing/bathroom;Assistance with cooking/housework;Assist for transportation;Help with stairs or ramp for entrance   Equipment Recommendations   (defer)    Recommendations for Other Services       Precautions / Restrictions Precautions Precautions: Fall Precaution Comments: trach collar, cortrak, R flank drain Restrictions Weight Bearing Restrictions: No     Mobility  Bed Mobility Overal bed mobility: Needs Assistance Bed Mobility: Supine to Sit, Sit to Supine     Supine to sit: Max assist, +2 for physical assistance Sit to supine: Max assist, +2 for physical assistance, +2 for safety/equipment   General bed mobility comments: patient able to utilize L UE to assist with trunk elevation but ultimately needed maxA+2 for LE management and repositioning hips towards EOB    Transfers Overall transfer level: Needs assistance Equipment used: Ambulation equipment used Transfers: Sit to/from Stand Sit to Stand: Max assist, +2 physical assistance, +2 safety/equipment, Min assist           General transfer comment: required max multimodal cues for initiation initially from EOB and required maxA+2 to stand. Once in [place], able to stand from flaps with minA+2 to steady in upright standing. Performed sit to stand x ~4 during session Transfer via Lift Equipment: [place]  Ambulation/Gait                   Stairs             Wheelchair Mobility    Modified Rankin (Stroke Patients Only)       Balance Overall balance assessment: Needs assistance Sitting-balance support: Feet supported, No upper extremity supported Sitting balance-Leahy Scale: Fair Sitting balance - Comments: able to sit EOB while holding cup with ice and spoon without UE support  Standing balance support: Bilateral upper extremity supported Standing balance-Leahy Scale: Poor                              Cognition  Arousal/Alertness: Awake/alert Behavior During Therapy: Flat affect Overall Cognitive Status: Impaired/Different from baseline Area of Impairment: Following commands, Attention, Safety/judgement                   Current Attention Level: Sustained   Following Commands: Follows one step commands with increased time, Follows one step commands inconsistently Safety/Judgement: Decreased awareness of safety     General Comments: shaking head yes/no to questions asked. requires increased time to process commands but following at least 50% of commands if not more. Difficult to focus with conversations in room during mobility        Exercises      General Comments        Pertinent Vitals/Pain Pain Assessment Pain Assessment: Faces Faces Pain Scale: Hurts a little bit Pain Location: generalized Pain Descriptors / Indicators: Grimacing, Discomfort Pain Intervention(s): Monitored during session    Home Living                          Prior Function            PT Goals (current goals can now be found in the care plan section) Acute Rehab PT Goals Patient Stated Goal: unable to state PT Goal Formulation: Patient unable to participate in goal setting Time For Goal Achievement: 10/28/21 Potential to Achieve Goals: Fair Progress towards PT goals: Progressing toward goals    Frequency    Min 2X/week      PT Plan Current plan remains appropriate    Co-evaluation PT/OT/SLP Co-Evaluation/Treatment: Yes Reason for Co-Treatment: Complexity of the patient's impairments (multi-system involvement);For patient/therapist safety;To address functional/ADL transfers;Necessary to address cognition/behavior during functional activity PT goals addressed during session: Mobility/safety with mobility;Balance        AM-PAC PT "6 Clicks" Mobility   Outcome Measure  Help needed turning from your back to your side while in a flat bed without using bedrails?: A Lot Help  needed moving from lying on your back to sitting on the side of a flat bed without using bedrails?: A Lot Help needed moving to and from a bed to a chair (including a wheelchair)?: Total Help needed standing up from a chair using your arms (e.g., wheelchair or bedside chair)?: Total Help needed to walk in hospital room?: Total Help needed climbing 3-5 steps with a railing? : Total 6 Click Score: 8    End of Session Equipment Utilized During Treatment: Oxygen Activity Tolerance: Patient tolerated treatment well Patient left: in bed;with call bell/phone within reach;with family/visitor present;with bed alarm set Nurse Communication: Mobility status PT Visit Diagnosis: Muscle weakness (generalized) (M62.81);Other symptoms and signs involving the nervous system (X91.478)     Time: 2956-2130 PT Time Calculation (min) (ACUTE ONLY): 42 min  Charges:  $Therapeutic Activity: 23-37 mins                     Zedric Deroy A. Dan Humphreys PT, DPT Acute Rehabilitation Services Office [phone]    Viviann Spare 10/16/2021, 1:04 PM

## 2021-10-16 NOTE — TOC Progression Note (Addendum)
Transition of Care [facility]) - Progression Note    Patient Details  Name: Troy Randall MRN: [id number] Date of Birth: [birth year]  [facility]) CM/SW Contact  Joanne Chars, LCSW Phone Number: 10/16/2021, 3:36 PM  Clinical Narrative:   LTAC appeal signed by Dr Doristine Bosworth, faxed to [facility], per [employer] email. Fax: [phone].    Expected Discharge Plan: Long Term Acute Care (LTAC) Barriers to Discharge: Continued Medical Work up  Expected Discharge Plan and Services Expected Discharge Plan: [place] (LTAC)   Discharge Planning Services: CM Consult   Living arrangements for the past 2 months: Single Family Home                                       Social Determinants of Health (SDOH) Interventions    Readmission Risk Interventions     No data to display

## 2021-10-16 NOTE — Progress Notes (Addendum)
Neurology Progress Note  Subjective: Patient is improved today.  He is able to wave hello and goodbye and is more interactive.  Per his wife, he has been able to assist in his care by rolling in bed and has been able to speak with a Passy-Muir valve.  He has completed all of his PLEX treatments.  Objective: Current vital signs: BP 115/63   Pulse 92   Temp 98 F (36.7 C) (Oral)   Resp 20   Ht 6' (1.829 m)   Wt 125.4 kg   SpO2 98%   BMI 37.49 kg/m  Vital signs in last 24 hours: Temp:  [98 F (36.7 C)-99.2 F (37.3 C)] 98 F (36.7 C) (10/12 0900) Pulse Rate:  [89-107] 92 (10/12 0900) Resp:  [18-22] 20 (10/12 0900) BP: (90-117)/(44-70) 115/63 (10/12 0849) SpO2:  [97 %-100 %] 98 % (10/12 0900) FiO2 (%):  [28 %-30 %] 28 % (10/12 0724) Weight:  [125.4 kg] 125.4 kg (10/11 1625)  Intake/Output from previous day: 10/11 0701 - 10/12 0700 In: 470 [NG/GT:470] Out: 850 [Urine:850] Intake/Output this shift: No intake/output data recorded. Nutritional status:  Diet Order             Diet NPO time specified  Diet effective now                  HEENT: [place]/AT. Trach collar is noted with yellow secretions Ext: No edema Neurological exam He is awake, alert, able to track the examiner.  Patient is nonverbal but wife reports that he has been able to speak with a Passy-Muir valve.  He is able to follow most one step but not two step commands.  Able to give a thumbs up bilaterally Cranial nerves: PERRL, EOMI, hearing intact to voice, face symmetrical.   Motor examination:  Will move all extremities spontaneously and to command with good antigravity strength (4/5).  DTR 2+ On later attending eval, working with PT/OT/SLP. Had some automatic speech for SLP (asked how he was doing, replied "all right"). Continues to seemingly appropriately shake head yes/no, delayed responses, follows some commands. Stands with sarasteady and PT/OT support. No clear focal weakness appreciated on  observation  Lab Results Reviewed: CBC    Component Value Date/Time   WBC 3.8 (L) 10/16/2021 0353   RBC 2.67 (L) 10/16/2021 0353   HGB 7.7 (L) 10/16/2021 0353   HCT 25.2 (L) 10/16/2021 0353   PLT 220 10/16/2021 0353   MCV 94.4 10/16/2021 0353   MCH 28.8 10/16/2021 0353   MCHC 30.6 10/16/2021 0353   RDW 17.0 (H) 10/16/2021 0353   LYMPHSABS 1.1 10/16/2021 0353   MONOABS 0.4 10/16/2021 0353   EOSABS 0.2 10/16/2021 0353   BASOSABS 0.0 10/16/2021 0353      Latest Ref Rng & Units 10/15/2021    3:12 AM 10/13/2021    2:00 PM 10/13/2021    3:06 AM  BMP  Glucose 70 - 99 mg/dL 117  164  113   BUN 6 - 20 mg/dL 14  10  8    Creatinine 0.61 - 1.24 mg/dL 0.51  0.52  0.49   Sodium 135 - 145 mmol/L 140  141  142   Potassium 3.5 - 5.1 mmol/L 3.7  3.8  3.5   Chloride 98 - 111 mmol/L 105  107  106   CO2 22 - 32 mmol/L 27  28  27    Calcium 8.9 - 10.3 mg/dL 8.8  8.5  9.0      - LP  was performed by CCM on 9/18. CSF findings: - Glucose 126, RBC count 1-5 (unremarkable), clear, colorless, total protein normal, WBC normal.  - Gram stain: Negative - Fungal and bacterial cultures of CSF negative thus far - Infectious disease Biofire panel negative for all pathogens tested, including cryptococcus, VZV and HSV.  - Cryptococcal Ag negative.  - IgG index is normal at 0.5.  - OCBs testing has resulted: No oligoclonal bands seen but he does have matched bands in both serum and CSF. - Serum labs:  - Vitamin B12 level consistent with supplementation. Mildly elevated Na. BUN 30. ALT 48. TSH normal. ANA negative.  - C3 complement level elevated at 184. Autoimmune panel otherwise negative.  - HIV screen negative.  - Serum MOG (10/07/21) negative  Medications: Scheduled:  Chlorhexidine Gluconate Cloth  6 each Topical Daily   doxazosin  2 mg Per Tube Daily   feeding supplement (PROSource TF20)  60 mL Per Tube BID   free water  250 mL Per Tube Q6H   influenza vac split quadrivalent PF  0.5 mL Intramuscular  Tomorrow-1000   insulin aspart  0-15 Units Subcutaneous Q4H   insulin aspart  10 Units Subcutaneous Q4H   insulin glargine-yfgn  40 Units Subcutaneous BID   nutrition supplement (JUVEN)  1 packet Per Tube BID   mouth rinse  15 mL Mouth Rinse 4 times per day   pantoprazole  40 mg Per Tube BID   pneumococcal 23 valent vaccine  0.5 mL Intramuscular Tomorrow-1000   rosuvastatin  40 mg Per Tube Daily   senna-docusate  1 tablet Per Tube BID   sodium chloride flush  10-40 mL Intracatheter Q12H   sodium chloride flush  5 mL Intracatheter Q8H   Zinc Oxide   Topical BID   Continuous:  sodium chloride 10 mL/hr at 10/10/21 1800   sodium chloride 250 mL (10/11/21 [id number])   sodium chloride Stopped (10/07/21 1454)   citrate dextrose     dextrose Stopped (10/04/21 1008)   dextrose     feeding supplement (VITAL 1.5 CAL) 1,000 mL (10/15/21 0433)   lactated ringers     Assessment: 61 year old patient with history of stroke in April of this year with minimal residual deficits and imaging concerning in hindsight of FLAIR/T2 hyperintense lesions-noncontrasted studies unclear whether there was any enhancement, HTN, HLD, DM and recent jejunal bleed, presented with continued AMS and concerning multifocal enhancing white matter lesions on MRI that exhibit morphologies and a distribution suggestive of demyelination such as ADEM (acute disseminated encephalomyelitis) He has no prior history of demyelinating disease but review of noncontrasted MRI shows the FLAIR/T2 hyperintensities as above. Spine imaging negative for cord lesions (though fairly motion limited on two attempts, 9/19 and 9/29). Has completed a 5 day course of IV Solumedrol as empiric treatment for the lesions without improvement in presentation. Plan for PLEX therapy with five treatments completed  Has completed inpatient treatment for likely ADEM versus inflammatory demyelination. Exam improving, no further inpatient neuro needs at this time, defer  long term plan for possible immunosuppression to outpatient provider.   Recommendations: -Added NMO to CSF, to be followed up outpatient -JCV, Hepatitis panel, Tuberculosis screening for safety labs to guide possible long term immunosuppression on an outpatient basis ordered -Supportive medical management per primary teams as you are -Neurology will sign off with plan for outpatient follow up, ambulatory referral to Dr. Felecia Shelling placed at [place] , MSN, AGACNP-BC [facility]  See Amion for schedule and pager information 10/16/2021 9:21 AM   Attending Neurologist's note:  I personally saw this patient, gathering history, performing a full neurologic examination, reviewing relevant labs, personally reviewing relevant imaging, and formulated the assessment and plan, adding the note above for completeness and clarity to accurately reflect my thoughts  Greater than 35 minutes spent in care of the patient today, greater than 50% at bedside independent of time spent by NP

## 2021-10-16 NOTE — Progress Notes (Signed)
Speech Language Pathology Treatment: [name] Speaking valve;Dysphagia  Patient Details Name: Troy Randall MRN: [id number] DOB: 12-16-1960 Today's Date: 10/16/2021 Time: 1005-1105 SLP Time Calculation (min) (ACUTE ONLY): 60 min  Assessment / Plan / Recommendation Clinical Impression  Patient seen by SLP for skilled treatment focused on PMV usage/toleration and dysphagia treatment/PO trials. Patient was awake and alert when SLP arrived and wife in room. At baseline, SpO2 at 100% and RR at 20. SLP donned PMV and aside from doffing quickly a few times to check for backpressure (there was none), he tolerated PMV for 50 minutes. No significant change in SpO2 (stayed above 97%) or RR (stayed in range 20-24 and a couple times high as 28-30). He tolerated PMV while lying in bed, sitting edge of bed (with PT and OT) and while standing. He exhibited one spontaneous verbalization, telling OT "please" but otherwise, only vocalizations were when he was throat clearing/coughing. He demonstrated ability to redirect air through upper airway consistently throughout PMV usage. While patient sitting edge of bed, SLP assessed his toleration with ice chips and cup sips of water. He was able to hold cup with initial cues to initiate and he then able to give self sips. He was also able to hold spoon to self feed ice chips. Swallow initiation appeared timely and patient exhibited only a couple instances of delayed cough response which did not sound wet. SLP instructed/demonstrated to wife how to donn and doff PMV and she was able to return demonstrate without difficulty. SLP recommending to increase PMV usage to with all therapies and with staff/trained caregiver supervision. Plan for FEES to assess swallow function.    HPI HPI: Pt is a 61 y.o. male with history of stroke in 4/23, HTN, HLD and DM with recent severe jejunal bleed s/p vessel coiling who is being seen for altered mental status and concerning white matter  changes on MRI. Patient's wife reports that after his stroke in 4/23 he made a good recovery and was able to perform ADLs. He had some residual dysarthria. Pt presented to [facility] on 8/31 with GI bleeding and has remained persistently altered. Intubated 9/3 and trach placed 9/11. He currently has #6 cuffed trach tolerating cuff deflated.      SLP Plan  Continue with current plan of care      Recommendations for follow up therapy are one component of a multi-disciplinary discharge planning process, led by the attending physician.  Recommendations may be updated based on patient status, additional functional criteria and insurance authorization.    Recommendations  Diet recommendations: NPO Medication Administration: Via alternative means      Patient may use Passy-Muir Speech Valve: During all therapies with supervision;Caregiver trained to provide supervision;Intermittently with supervision PMSV Supervision: Full MD: Please consider changing trach tube to : Cuffless         Oral Care Recommendations: Oral care QID Follow Up Recommendations: SLP at Long-term acute care hospital Assistance recommended at discharge: Frequent or constant Supervision/Assistance SLP Visit Diagnosis: Aphonia (R49.1);Dysphagia, unspecified (R13.10) Plan: Continue with current plan of care           Sonia Baller, MA, CCC-SLP Speech Therapy

## 2021-10-16 NOTE — Progress Notes (Addendum)
Occupational Therapy Treatment Patient Details Name: Troy Randall MRN: [id number] DOB: Aug 20, 1960 Today's Date: 10/16/2021   History of present illness Patient is a 61 y/o male who presents on 09/04/21 with dark stools, dizziness and SOB. Found to have GI bleed and acute anemia s/p embolization 09/06/21 and percutaneous cholecystostomy 09/07/21. Intubated 9/3, CRRT 9/5-9/8. Hemorrhagic and septic shock 9/5. Trach placed 9/11. 9/16 MRI rapid development numerous round white matter lesions in both cerebral hemispheres; 9/17 MRI: numerous white matter lesions consistent with active demyelination - concerning for MS, ADEM, and cerebral vasculitis; 9/18 Lumbar puncture. Pt returned to ICU on 9/29 with aspiration PNA, tachypnea, tachycardia and increased WOB. 10/2 - pt transferred back to [facility]; started having GIB. Underwent sigmoidoscopy on 10/07/2021. Plasma exchange started. PMH includes CVA, DM, HTN.   OT comments  Pt progressing towards established OT goals and demonstrating increased arousal and following of commands. Pt requiring Max A and cues for donning socks at bed level. Pt performing bed mobility to sit EOB with Max A +2 and then maintaining balance to participate in self feeding task of ice chips (with SLP present). Pt performing sit<>stand using stedy with initial Max A +2; later requiring Min A +2 for sit<>Stand from stedy flaps. Updated OT goals. Continue to recommend dc to [facility] and will continue to follow acutely as admitted.   Recommendations for follow up therapy are one component of a multi-disciplinary discharge planning process, led by the attending physician.  Recommendations may be updated based on patient status, additional functional criteria and insurance authorization.    Follow Up Recommendations  OT at Long-term acute care hospital    Assistance Recommended at Discharge Frequent or constant Supervision/Assistance  Patient can return home with the following  Two people to help  with walking and/or transfers;Two people to help with bathing/dressing/bathroom;Direct supervision/assist for medications management;Direct supervision/assist for financial management;Assist for transportation;Help with stairs or ramp for entrance;Assistance with feeding;Assistance with cooking/housework   Equipment Recommendations  Other (comment)    Recommendations for Other Services      Precautions / Restrictions Precautions Precautions: Fall Precaution Comments: trach collar, cortrak, R flank drain Restrictions Weight Bearing Restrictions: No       Mobility Bed Mobility Overal bed mobility: Needs Assistance Bed Mobility: Supine to Sit, Sit to Supine     Supine to sit: Max assist, +2 for physical assistance Sit to supine: Max assist, +2 for physical assistance, +2 for safety/equipment   General bed mobility comments: patient able to utilize L UE to assist with trunk elevation but ultimately needed maxA+2 for LE management and repositioning hips towards EOB    Transfers Overall transfer level: Needs assistance Equipment used: Ambulation equipment used Transfers: Sit to/from Stand Sit to Stand: Max assist, +2 physical assistance, +2 safety/equipment, Min assist           General transfer comment: required max multimodal cues for initiation initially from EOB and required maxA+2 to stand. Once in [place], able to stand from flaps with minA+2 to steady in upright standing. Performed sit to stand x ~4 during session Transfer via Lift Equipment: Stedy   Balance Overall balance assessment: Needs assistance Sitting-balance support: Feet supported, No upper extremity supported Sitting balance-Leahy Scale: Fair Sitting balance - Comments: able to sit EOB while holding cup with ice and spoon without UE support   Standing balance support: Bilateral upper extremity supported Standing balance-Leahy Scale: Poor  ADL either performed or  assessed with clinical judgement   ADL Overall ADL's : Needs assistance/impaired Eating/Feeding: Minimal assistance;Sitting Eating/Feeding Details (indicate cue type and reason): Requiring tactile cues to reach and grap cup and then spoon to perforl self feeding of ice.                 Lower Body Dressing: Maximal assistance;Bed level Lower Body Dressing Details (indicate cue type and reason): Max cues to have pt lift for donning socks. Pt able to make eye contact with therapist upon his name being called. Increased cues to sustain memory and attention to elevate foot off bed for odnning socks. Toilet Transfer: Maximal assistance;+2 for physical assistance           Functional mobility during ADLs: Maximal assistance;+2 for physical assistance (stedy) General ADL Comments: Pt performing bed mobility and sitting at EOB. Performing self feeding task with SLP present. Pt then performing sit<>stand with stedy    Extremity/Trunk Assessment Upper Extremity Assessment Upper Extremity Assessment: Generalized weakness   Lower Extremity Assessment Lower Extremity Assessment: Defer to PT evaluation        Vision       Perception     Praxis      Cognition Arousal/Alertness: Awake/alert Behavior During Therapy: Flat affect Overall Cognitive Status: Impaired/Different from baseline Area of Impairment: Following commands, Attention, Safety/judgement                   Current Attention Level: Sustained   Following Commands: Follows one step commands with increased time, Follows one step commands inconsistently Safety/Judgement: Decreased awareness of safety     General Comments: shaking head yes/no to questions asked. requires increased time to process commands but following at least 50% of commands if not more. Difficult to focus with conversations in room during mobility        Exercises      Shoulder Instructions       General Comments Wife present. VSS  throughout    Pertinent Vitals/ Pain       Pain Assessment Pain Assessment: Faces Faces Pain Scale: Hurts a little bit Pain Location: generalized Pain Descriptors / Indicators: Grimacing, Discomfort Pain Intervention(s): Monitored during session, Limited activity within patient's tolerance, Repositioned  Home Living                                          Prior Functioning/Environment              Frequency  Min 2X/week        Progress Toward Goals  OT Goals(current goals can now be found in the care plan section)  Progress towards OT goals: Progressing toward goals  Acute Rehab OT Goals OT Goal Formulation: With family Time For Goal Achievement: 10/30/21 Potential to Achieve Goals: Fair ADL Goals Pt/caregiver will Perform Home Exercise Program: Increased strength;Both right and left upper extremity;With written HEP provided;With Supervision (Mod cues) Additional ADL Goal #1: Pt will follow one step commands within 10 seconds with 75% accuracy Additional ADL Goal #2: Pt will perform bed mobility with Min A + 2 in preparation for ADLs Additional ADL Goal #3: Pt will roll with Min A for pressure relief and pericare.  Plan Discharge plan remains appropriate    Co-evaluation    PT/OT/SLP Co-Evaluation/Treatment: Yes Reason for Co-Treatment: To address functional/ADL transfers;For patient/therapist safety PT goals addressed during session:  Mobility/safety with mobility;Balance OT goals addressed during session: ADL's and self-care      AM-PAC OT "6 Clicks" Daily Activity     Outcome Measure   Help from another person eating meals?: A Little Help from another person taking care of personal grooming?: Total Help from another person toileting, which includes using toliet, bedpan, or urinal?: A Lot Help from another person bathing (including washing, rinsing, drying)?: Total Help from another person to put on and taking off regular upper body  clothing?: Total Help from another person to put on and taking off regular lower body clothing?: Total 6 Click Score: 9    End of Session Equipment Utilized During Treatment: Oxygen  OT Visit Diagnosis: Muscle weakness (generalized) (M62.81);Other symptoms and signs involving cognitive function;Low vision, both eyes (H54.2)   Activity Tolerance Patient tolerated treatment well   Patient Left in bed;with call bell/phone within reach;with bed alarm set;with family/visitor present   Nurse Communication Mobility status        Time: 5956-3875 OT Time Calculation (min): 46 min  Charges: OT General Charges $OT Visit: 1 Visit OT Treatments $Self Care/Home Management : 8-22 mins  Emran Molzahn MSOT, OTR/L Acute Rehab Office: [phone]  Theodoro Grist Theoren Palka 10/16/2021, 1:59 PM

## 2021-10-16 NOTE — Progress Notes (Signed)
Nutrition Brief Note  ST present at time of visit today attempting PMV trials. Planning for FEES to assess swallow function.   Spoke with pt's wife regarding likely need for long term nutrition access given ongoing need for Cortrak TF. She is agreeable to PEG tube placement and understands necessity to meet nutrition needs until fully able to tolerate PO intake.   Discussed with MD. Noted LTAC placement pending. Will continue to follow for ongoing nutrition needs.   Clayborne Dana, RDN, LDN Clinical Nutrition

## 2021-10-17 DIAGNOSIS — G9341 Metabolic encephalopathy: Secondary | ICD-10-CM | POA: Diagnosis not present

## 2021-10-17 LAB — CBC WITH DIFFERENTIAL/PLATELET
Abs Immature Granulocytes: 0.02 10*3/uL (ref 0.00–0.07)
Basophils Absolute: 0 10*3/uL (ref 0.0–0.1)
Basophils Relative: 1 %
Eosinophils Absolute: 0.2 10*3/uL (ref 0.0–0.5)
Eosinophils Relative: 6 %
HCT: 25.9 % — ABNORMAL LOW (ref 39.0–52.0)
Hemoglobin: 7.8 g/dL — ABNORMAL LOW (ref 13.0–17.0)
Immature Granulocytes: 1 %
Lymphocytes Relative: 24 %
Lymphs Abs: 1 10*3/uL (ref 0.7–4.0)
MCH: 28.5 pg (ref 26.0–34.0)
MCHC: 30.1 g/dL (ref 30.0–36.0)
MCV: 94.5 fL (ref 80.0–100.0)
Monocytes Absolute: 0.4 10*3/uL (ref 0.1–1.0)
Monocytes Relative: 11 %
Neutro Abs: 2.4 10*3/uL (ref 1.7–7.7)
Neutrophils Relative %: 57 %
Platelets: 233 10*3/uL (ref 150–400)
RBC: 2.74 MIL/uL — ABNORMAL LOW (ref 4.22–5.81)
RDW: 16.8 % — ABNORMAL HIGH (ref 11.5–15.5)
WBC: 4 10*3/uL (ref 4.0–10.5)
nRBC: 0 % (ref 0.0–0.2)

## 2021-10-17 LAB — MAGNESIUM: Magnesium: 2 mg/dL (ref 1.7–2.4)

## 2021-10-17 LAB — PHOSPHORUS: Phosphorus: 3.7 mg/dL (ref 2.5–4.6)

## 2021-10-17 LAB — GLUCOSE, CAPILLARY
Glucose-Capillary: 122 mg/dL — ABNORMAL HIGH (ref 70–99)
Glucose-Capillary: 124 mg/dL — ABNORMAL HIGH (ref 70–99)
Glucose-Capillary: 129 mg/dL — ABNORMAL HIGH (ref 70–99)
Glucose-Capillary: 135 mg/dL — ABNORMAL HIGH (ref 70–99)
Glucose-Capillary: 145 mg/dL — ABNORMAL HIGH (ref 70–99)
Glucose-Capillary: 148 mg/dL — ABNORMAL HIGH (ref 70–99)

## 2021-10-17 MED ORDER — OSMOLITE 1.5 CAL PO LIQD
1000.0000 mL | ORAL | Status: DC
  Administered 2021-10-17: 1000 mL
  Filled 2021-10-17: qty 1000

## 2021-10-17 NOTE — Progress Notes (Signed)
Nutrition Brief Note  Vital 1.5 is out of stock.  Adjusting TF regimen via Cortrak from Vital 1.5 to Osmolite 1.5.  New regimen: Osmolite 1.5 at 27ml/hr (1422ml per day) 60 ml ProSource TF20 BID Continue free water flushes 270ml q6h  New TF regimen provides- 2320 kcal, 130g protein, 2097 ml free water daily  Made RN aware of new TF changes.   Clayborne Dana, RDN, LDN Clinical Nutrition

## 2021-10-17 NOTE — Progress Notes (Signed)
Supervising Physician: Marliss Coots  Patient Status:  [facility] - In-pt  Chief Complaint: GI bleed s/p mesenteric arteriogram with embolization 09/06/21. Acute cholecystitis s/p cholecystomy tube placement on 09/07/21  HPI: Troy Randall, 61 year old male, has a medical history significant for HTN, DM2 and CVA. He presented to the hospital 09/04/21 with a GI bleed and was seen in IR 09/06/21 for embolization. 09/07/21 he had altered mental status attributed to septic and hemorrhagic shock. He was intubated. Imaging showed acute cholecystitis and he was seen in IR that same day for cholecystostomy tube placement. CRRT was initiated 09/09/21.   He remained persistently altered and underwent trach placement 09/15/21. MRI brain 09/20/21 showed numerous white lesions concerning for a demyelination process. 10/06/21 plasma exchange was started. His last plasma exchange was 10/15/21 and he continues to demonstrate some encephalopathy.   He has been receiving nutrition via cortrak. Interventional Radiology has been asked to evaluate this patient for an image-guided gastrostomy tube for long-term nutritional needs. Imaging reviewed and procedure approved by Dr. Bryn Gulling. CT imaging shows the patient has a surgical mesh in the anterior abdominal wall from a prior hernia repair.   Subjective:  Patient in bed asleep. His wife and brother are at the bedside.   Allergies: Metformin and related and Penicillins  Medications: Prior to Admission medications   Medication Sig Start Date End Date Taking? Authorizing Provider  amLODipine (NORVASC) 10 MG tablet Take 1 tablet (10 mg total) by mouth daily. 06/15/17  Yes Aliene Beams, MD  aspirin EC 81 MG tablet Take 81 mg by mouth daily. Swallow whole.   Yes [provider]  clopidogrel (PLAVIX) 75 MG tablet Take 1 tablet (75 mg total) by mouth daily. 04/30/21  Yes Azucena Fallen, MD  glipiZIDE (GLUCOTROL XL) 10 MG 24 hr tablet Take 1 tablet (10 mg total) by mouth  daily with breakfast. 04/16/17  Yes Hagler, Fleet Contras, MD  levocetirizine (XYZAL) 5 MG tablet Take 1 tablet by mouth at bedtime.   Yes [provider]  lisinopril (PRINIVIL,ZESTRIL) 40 MG tablet Take 1 tablet (40 mg total) by mouth daily. 05/12/17  Yes Aliene Beams, MD  metFORMIN (GLUCOPHAGE-XR) 500 MG 24 hr tablet Take 500-1,000 mg by mouth in the morning and at bedtime. 02/09/21  Yes [provider]  rosuvastatin (CRESTOR) 40 MG tablet Take 1 tablet (40 mg total) by mouth daily. 04/29/21  Yes Azucena Fallen, MD  tamsulosin (FLOMAX) 0.4 MG CAPS capsule Take 0.4 mg by mouth daily. 02/12/21  Yes [provider]  traZODone (DESYREL) 150 MG tablet Take 150 mg by mouth at bedtime. 02/04/21  Yes [provider]  TRESIBA FLEXTOUCH 200 UNIT/ML FlexTouch Pen Inject 20 Units into the skin in the morning and at bedtime. 03/14/21  Yes [provider]  BD VEO INSULIN SYRINGE U/F 31G X 15/64" 1 ML MISC  USE AS DIRECTED 06/08/17   Aliene Beams, MD  glucose blood (ONETOUCH VERIO) test strip TEST twice a day 05/04/17   Aliene Beams, MD  Insulin Pen Needle (NOVOTWIST) 32G X 5 MM MISC Use two daily to inject Victoza and Toujeo. 04/25/15   Reather Littler, MD  [facility] DELICA LANCETS 33G MISC Use to check blood sugar once a day dx code E11.65 11/21/14   Reather Littler, MD     Vital Signs: BP 119/69   Pulse 95   Temp 98.2 F (36.8 C) (Oral)   Resp 16   Ht 6' (1.829 m)   Wt 276 lb  7.3 oz (125.4 kg)   SpO2 98%   BMI 37.49 kg/m   Physical Exam Constitutional:      Appearance: He is obese.     Comments: Patient asleep but his wife states he has been interactive and able to nod/gesture appropriately. Patient did wake up enough to shake his head "no" when I asked if he had pain around cholecystostomy drain.   Pulmonary:     Effort: Pulmonary effort is normal.     Comments: Trach/trach collar Abdominal:     Palpations: Abdomen is soft.     Tenderness: There is no abdominal  tenderness.     Comments: RUQ drain to gravity. Scant amount of bile in gravity bag. Per bedside RN no issues flushing the drain.   Skin:    General: Skin is warm and dry.     Imaging: No results found.  Labs:  CBC: Recent Labs    10/14/21 0237 10/15/21 0312 10/16/21 0353 10/17/21 0325  WBC 6.3 6.4 3.8* 4.0  HGB 7.6* 7.6* 7.7* 7.8*  HCT 24.2* 25.5* 25.2* 25.9*  PLT 193 239 220 233    COAGS: Recent Labs    04/28/21 1504 09/05/21 0307 09/06/21 2240 09/07/21 2056 10/03/21 0816  INR 1.0 1.2 1.6* 2.0* 1.0  APTT 26  --   --  32 <20*    BMP: Recent Labs    10/12/21 0312 10/13/21 0306 10/13/21 1400 10/15/21 0312  NA 136 142 141 140  K 3.5 3.5 3.8 3.7  CL 103 106 107 105  CO2 24 27 28 27   GLUCOSE 235* 113* 164* 117*  BUN 6 8 10 14   CALCIUM 8.5* 9.0 8.5* 8.8*  CREATININE 0.45* 0.49* 0.52* 0.51*  GFRNONAA >60 >60 >60 >60    LIVER FUNCTION TESTS: Recent Labs    10/09/21 0334 10/10/21 0317 10/11/21 0304 10/12/21 0312  BILITOT 0.8 0.6 0.8 0.6  AST 57* 40 26 28  ALT 81* 75* 34 37  ALKPHOS 69 71 37* 47  PROT 4.7* 4.6* 4.7* 4.8*  ALBUMIN 3.7 3.3* 3.7 3.6    Assessment and Plan:  GI bleed s/p mesenteric arteriogram with embolization 09/06/21. Acute cholecystitis s/p cholecystomy tube placement on 09/07/21  Patient is hemodynamically stable, afebrile and without leukocytosis. There has been scant output from his cholecystostomy drain for the past several days. Patient denies pain/tenderness to palpation around RUQ area. Cholangiogram ordered for next week.   Acute metabolic encephalopathy with dysphagia: I spoke with the patient's wife and brother today about gastrostomy tube placement. They said the dietician has cleared the patient for a pureed diet and they would like to see what his oral intake looks like over the weekend. The risks/benefits of the procedure were discussed but consent was not obtained. IR will check in with the patient's wife/hospital team  Monday to check on his progress. Procedure tentatively planned for Tuesday 10/21/21.   Electronically Signed: Soyla Dryer, AGACNP-BC [phone] 10/17/2021, 10:58 AM   I spent a total of 25 Minutes at the the patient's bedside AND on the patient's hospital floor or unit, greater than 50% of which was counseling/coordinating care for percutaneous cholecystostomy and g-tube planning

## 2021-10-17 NOTE — Progress Notes (Signed)
10/17/2021  I have seen and evaluated the patient for trach f/u  S:  Doing well, handling secretions Starting to be able to follow commands and communicate more Currently with 6-0 cuffed trach in place  O: Blood pressure 118/71, pulse 100, temperature 97.9 F (36.6 C), resp. rate 15, height 6' (1.829 m), weight 125.4 kg, SpO2 98 %.  No distress Able to give thumbs up Lungs clear Heart sounds regular  A:  Ill defined demyelinating disease causing persistent respiratory compromise and eventual tracheostomy  P:  Resp status seems to slowly be improving. Will downsize to 4-0 cuffless trach and consider capping trials Monday if he tolerates   Erskine Emery MD [facility] Prefer epic messenger for cross cover needs If after hours, please call E-link

## 2021-10-17 NOTE — Procedures (Signed)
Objective Swallowing Evaluation: Type of Study: FEES-Fiberoptic Endoscopic Evaluation of Swallow   Patient Details  Name: Troy Randall MRN: [id number] Date of Birth: 12/28/1960  Today's Date: 10/17/2021 Time: SLP Start Time (ACUTE ONLY): 1208 -SLP Stop Time (ACUTE ONLY): 1240  SLP Time Calculation (min) (ACUTE ONLY): 32 min   Past Medical History:  Past Medical History:  Diagnosis Date   Anemia    Arthritis    CVA (cerebral vascular accident) (HCC)    Diabetes mellitus without complication (HCC)    Hyperlipidemia    Hypertension    Kidney stones    Past Surgical History:  Past Surgical History:  Procedure Laterality Date   ANKLE SURGERY     BIOPSY  09/10/2021   Procedure: BIOPSY;  Surgeon: Jeani Hawking, MD;  Location: [facility] ENDOSCOPY;  Service: Gastroenterology;;   BIOPSY  10/07/2021   Procedure: BIOPSY;  Surgeon: Jeani Hawking, MD;  Location: [facility] ENDOSCOPY;  Service: Gastroenterology;;   COLONOSCOPY N/A 10/07/2021   Procedure: COLONOSCOPY;  Surgeon: Jeani Hawking, MD;  Location: [facility] ENDOSCOPY;  Service: Gastroenterology;  Laterality: N/A;   COLONOSCOPY WITH PROPOFOL N/A 09/06/2021   Procedure: COLONOSCOPY WITH PROPOFOL;  Surgeon: Dolores Frame, MD;  Location: [facility] ENDO SUITE;  Service: Gastroenterology;  Laterality: N/A;   ENTEROSCOPY N/A 09/10/2021   Procedure: ENTEROSCOPY;  Surgeon: Jeani Hawking, MD;  Location: [facility] ENDOSCOPY;  Service: Gastroenterology;  Laterality: N/A;   ENTEROSCOPY  09/06/2021   Procedure: ENTEROSCOPY;  Surgeon: Dolores Frame, MD;  Location: [facility] ENDO SUITE;  Service: Gastroenterology;;   ESOPHAGOGASTRODUODENOSCOPY (EGD) WITH PROPOFOL  09/06/2021   Procedure: ESOPHAGOGASTRODUODENOSCOPY (EGD) WITH PROPOFOL;  Surgeon: Dolores Frame, MD;  Location: [facility] ENDO SUITE;  Service: Gastroenterology;;   Wenda Low SIGMOIDOSCOPY N/A 09/19/2021   Procedure: Arnell Sieving;  Surgeon: Jeani Hawking, MD;  Location: [facility] ENDOSCOPY;  Service:  Gastroenterology;  Laterality: N/A;   FOREIGN BODY REMOVAL  09/19/2021   Procedure: FOREIGN BODY REMOVAL;  Surgeon: Jeani Hawking, MD;  Location: [facility] ENDOSCOPY;  Service: Gastroenterology;;   Emelda Brothers CAPSULE STUDY  09/06/2021   Procedure: GIVENS CAPSULE STUDY;  Surgeon: Dolores Frame, MD;  Location: [facility] ENDO SUITE;  Service: Gastroenterology;;   HERNIA REPAIR     umbilical x1 Incisional x1   INCISIONAL HERNIA REPAIR  04/13/2011   Procedure: LAPAROSCOPIC INCISIONAL HERNIA;  Surgeon: Dalia Heading, MD;  Location: [facility] ORS;  Service: General;  Laterality: N/A;  Recurrent Laparoscopic Incisional Herniorraphy with Mesh   IR ANGIOGRAM SELECTIVE EACH ADDITIONAL VESSEL  09/09/2021   IR ANGIOGRAM VISCERAL SELECTIVE  09/07/2021   IR ANGIOGRAM VISCERAL SELECTIVE  09/06/2021   IR ANGIOGRAM VISCERAL SELECTIVE  09/06/2021   IR EMBO ART  VEN HEMORR LYMPH EXTRAV  INC GUIDE ROADMAPPING  09/06/2021   IR GUIDED DRAIN W CATHETER PLACEMENT  09/07/2021   IR US GUIDE BX ASP/DRAIN  09/07/2021   IR US GUIDE VASC ACCESS RIGHT  09/07/2021   IR US GUIDE VASC ACCESS RIGHT  09/06/2021   KIDNEY STONE SURGERY     HPI: Pt is a 61 y.o. male with history of stroke in 4/23, HTN, HLD and DM with recent severe jejunal bleed s/p vessel coiling who is being seen for altered mental status and concerning white matter changes on MRI. Patient's wife reports that after his stroke in 4/23 he made a good recovery and was able to perform ADLs. He had some residual dysarthria. Pt presented to [facility] on 8/31 with GI bleeding and has remained persistently altered. Intubated 9/3  and trach placed 9/11. He currently has #6 cuffed trach tolerating cuff deflated.   Subjective: awake, alert, wife and son in room    Recommendations for follow up therapy are one component of a multi-disciplinary discharge planning process, led by the attending physician.  Recommendations may be updated based on patient status, additional functional criteria and  insurance authorization.  Assessment / Plan / Recommendation     10/17/2021    1:14 PM  Clinical Impressions  Clinical Impression Pt cooperative with verbal and intermittent tactile stimuli during FEES wearing PMV. Noted the following re: his anatomy: decrease movement of pt's right arytenoid cartilidge, edematous false vocal folds with inconsistent difficulty visualizing his true vocal cords and unable to follow commands for non swallow, speech tasks. Oral impairments were moderate and included right sided lateral sulci residue throughout FEES with all consistencies and anterior spillage. There was an 8-14 second delayed swallow onset with puree to the pyriform sinuses. PO's traveled down the right side lateral channel. Several episodes of immediate cough with cup sip nectar thickened juice and upon replay of video, appeared to deeply penetrate his laryngeal vestibule, on or near vocal cords, with mostly complete ejection. Residue was inconsistent and trace-minimal on right side of vallecule and pyriform sinus. Given oral phase deficits/decreased control and pt's cognitive deficits, a conservative diet texture of puree (Dys 1), honey thick liquids via spoon or cup (attempted straw however, was unable) is recommended. Donn speaking valve with meals, full assist, crush meds (or Cortrak), upright position and continued ST.  SLP Visit Diagnosis Dysphagia, oropharyngeal phase (R13.12)  Impact on safety and function Mild aspiration risk;Moderate aspiration risk         10/17/2021    1:14 PM  Treatment Recommendations  Treatment Recommendations Therapy as outlined in treatment plan below        10/17/2021    1:14 PM  Prognosis  Prognosis for Safe Diet Advancement Good  Barriers to Reach Goals Severity of deficits;Time post onset       10/17/2021    1:14 PM  Diet Recommendations  SLP Diet Recommendations Dysphagia 1 (Puree) solids;Honey thick liquids  Liquid Administration via Cup;Spoon   Medication Administration Via alternative means  Compensations Slow rate;Small sips/bites;Lingual sweep for clearance of pocketing;Monitor for anterior loss;Minimize environmental distractions  Postural Changes Seated upright at 90 degrees         10/17/2021    1:14 PM  Other Recommendations  Oral Care Recommendations Oral care BID  Other Recommendations Place PMSV during PO intake  Follow Up Recommendations Skilled nursing-short term rehab (<3 hours/day)  Assistance recommended at discharge Frequent or constant Supervision/Assistance  Functional Status Assessment Patient has had a recent decline in their functional status and demonstrates the ability to make significant improvements in function in a reasonable and predictable amount of time.       10/17/2021    1:14 PM  Frequency and Duration   Speech Therapy Frequency (ACUTE ONLY) min 2x/week  Treatment Duration 2 weeks         10/17/2021    1:14 PM  Oral Phase  Oral Phase Impaired  Oral - Honey Teaspoon Right anterior bolus loss;Decreased bolus cohesion;Right pocketing in lateral sulci  Oral - Honey Cup Decreased bolus cohesion;Right anterior bolus loss;Right pocketing in lateral sulci  Oral - Nectar Cup Right pocketing in lateral sulci;Right anterior bolus loss;Decreased bolus cohesion;Delayed oral transit  Oral - Thin Teaspoon WFL  Oral - Puree Delayed oral transit  10/17/2021    1:14 PM  Pharyngeal Phase  Pharyngeal Phase Impaired  Pharyngeal- Honey Teaspoon Pharyngeal residue - valleculae  Pharyngeal- Honey Cup Pharyngeal residue - valleculae  Pharyngeal- Nectar Cup Delayed swallow initiation-pyriform sinuses;Pharyngeal residue - valleculae;Penetration/Aspiration during swallow  Pharyngeal Material enters airway, CONTACTS cords and then ejected out  Pharyngeal- Thin Teaspoon WFL  Pharyngeal- Puree Delayed swallow initiation-pyriform sinuses        10/17/2021    1:14 PM  Cervical Esophageal Phase    Cervical Esophageal Phase WFL     Royce Macadamia 10/17/2021, 2:22 PM

## 2021-10-17 NOTE — Progress Notes (Signed)
PROGRESS NOTE    Troy Randall  JSE:[id number] DOB: [birth year] DOA: 09/04/2021 PCP: [name], MD   Brief Narrative:  61 years old male with medical history of hypertension, hyperlipidemia, type 2 diabetes, CVA, presented to the hospital with GI bleed.  Patient underwent IR embolization followed by percutaneous cholecystostomy tube placement due to ongoing altered mental status.  MRI of the brain showed numerous contrast-enhancing bilateral white matter lesions concerning for demyelinating disease.  Neurology was consulted and underwent lumbar puncture on 09/22/2021 with low suspicion for infectious etiology.  Neurology has initiated the patient on high-dose steroids on 09/23/2021.  Patient was then transferred to hospitalist service on 09/24/2021.  Significant events. 8/31 presented to [facility] ED, [name] Admit, GI Consult 9/1 CTA> no evidence of active GI bleed, 2 U PRBC 9/2 2 U PRBC, colonoscopy > no active bleed found but large clots throughout entire colon. EGD> small hiatal hernia, erosive gastropathy with no stigmata of recent bleeding, and non-bleeding duodenal diverticulum.   Underwent then small bowel enteroscopy which noted intermittent blood pumping in the proximal jejunum but was unable to be reached despite multiple attempts, was injected and clip placed. Repeat CTA repeated, which showed positive jejunal diverticular bleeding. IR> active extrav at [facility] territory, jejunal arcade branch, successful coil embo 9/3 Intubated. CTA> no evidence of active GI bleeding.  New finding of abnormal gallbladder with nondependent air, gallbladder wall air, pericholecystic and right upper quadrant information.  IR placed percutaneous cholecystotomy tube.  9/4 R. IJ central line placed with dialysis triple lumen catheter 9/5 CRRT initiated 9/6 Bedside EGD without any evidence of bleeding. CTA a/p without etiology for bleed 9/8 CRRT discontinued. Required ETT exchange yesterday due to blown cuff. 9/11  Bronchoscopic guided percutaneous tracheostomy placement.  Right IJ central line removed. 9/15 flexible sigmoidoscopy with removal of endoscopy 9/16 MRI brain w/o contrast amended study secondary to range of motion.  Rapid development numerous round white matter lesions in both cerebral hemispheres.  Prior MRIs. 9/17 MRI brain: Numerous contrast-enhancing bilateral white matter lesions, consistent with active demyelination. 9/18 Lumbar puncture  9/19 MRI cervical spine, thoracic : Limited exam, within limitations there is not definitive evidence of active demyelinization. Within limitations of motion artifact, there may be demyelinating lesions at the C6 and T1 vertebral body levels. 9/25-clinical exam not improved despite completion of 5-day IV steroids.  Neurology planning to repeat MRI of the brain cervical spine thoracic spine. Possible IR consult for catheter placement for possible Plex therapy if abnormal scans 9/26- scans attempted with ativan 9/29-patient transferred to ICU for respiratory distress due to aspiration pneumonia with sepsis 10/2-patient transferred back to [facility]; started having GI bleed, Dr. Mann/Dr. Benson Norway reconsulted.  Underwent sigmoidoscopy on 10/07/2021. 10/2: Plasma exchange started  Assessment & Plan:   Acute metabolic encephalopathy --MRI brain showed bilateral white matter lesions concerning for active demyelinating disease -S/p IV steroids for 5 days with no significant improvement -Currently undergoing plasma exchange as per neurology, patient received 5 doses, last 1 on 10/15/2021.Marland Kitchen  No further recommendations from neurology.  Hemorrhagic shock/GI bleed -Patient has had multiple endoscopic procedures last month including EGD/capsule endoscopy/coil embolization of jejunal branch of SMA. -Possibly from stress ulceration/ischemic disease as per GI: Status post sigmoidoscopy on 10/07/2021 which showed single solitary ulcer in the sigmoid colon which was biopsied and  pathology shows chronic colitis. -Has had multiple packed red cell transfusion during this hospitalization. -Monitor H&H.  Hemoglobin has remained stable now between 7 and 8.  Transfuse packed red  cells if hemoglobin is less than 7.  Acute respiratory failure with hypoxia status post tracheostomy Aspiration pneumonia -Status post tracheostomy placement on 09/15/2020.  PCCM following weekly. -Patient had acute aspiration event on 10/03/2021 and was placed on broad-spectrum antibiotics: Trach cultures grew MRSA and E. coli.  Treated with Rocephin and vancomycin and completed therapy.  E. coli cholecystitis status post percutaneous cholecystostomy drain placement -Had percutaneous cholecystostomy drain placement on 09/07/2021.  Subsequently had completed course of antibiotics -Will eventually need outpatient surgery/IR follow-up  Transaminitis -Resolved.  Thrombocytopenia -Resolved.  Diabetes mellitus type 2 with hyperglycemia -Continue long acting insulin along with CBGs with SSI  Acute kidney injury: Resolved -CRRT discontinued on 09/12/2021  Hypokalemia--improved  Hypophosphatemia -Improved  Hypertension -Continue monitoring blood pressure.  Currently on doxazosin   Obesity -Outpatient follow-up  Stage II medial sacral ulcer: Not present on admission -Continue local wound care.  Acute urinary retention -Foley catheter removed on 10/13/2021.  Subsequently, he has had issues with retention.  We will try straight in and out cath: If still continues to have issues with retention, will have to place Foley catheter again. -Continue doxazosin  Moderate protein calorie malnutrition/poor p.o. intake: Patient on tube feed currently.  Nutrition recommends PEG tube placement.  DVT prophylaxis: SCDs, avoiding heparin products, due to hemorrhagic shock, hemoglobin is still borderline. Code Status: Full Family Communication: Brother at bedside  Disposition Plan: Status is:  Inpatient Remains inpatient appropriate because: Medically stable.  Pending placement to LTAC.    Consultants: GI/neurology/PCCM/general surgery/nephrology/IR  Procedures: As above  Antimicrobials:  Anti-infectives (From admission, onward)    Start     Dose/Rate Route Frequency Ordered Stop   10/06/21 1515  cefTRIAXone (ROCEPHIN) 2 g in sodium chloride 0.9 % 100 mL IVPB        2 g 200 mL/hr over 30 Minutes Intravenous Every 24 hours 10/06/21 1420 10/09/21 1756   10/04/21 0945  metroNIDAZOLE (FLAGYL) IVPB 500 mg  Status:  Discontinued        500 mg 100 mL/hr over 60 Minutes Intravenous Every 12 hours 10/04/21 0853 10/06/21 1329   10/03/21 2200  vancomycin (VANCOREADY) IVPB 1500 mg/300 mL        1,500 mg 150 mL/hr over 120 Minutes Intravenous Every 12 hours 10/03/21 0613 10/10/21 0025   10/03/21 0700  vancomycin (VANCOREADY) IVPB 2000 mg/400 mL        2,000 mg 200 mL/hr over 120 Minutes Intravenous  Once 10/03/21 0613 10/03/21 1149   10/03/21 0700  ceFEPIme (MAXIPIME) 2 g in sodium chloride 0.9 % 100 mL IVPB  Status:  Discontinued        2 g 200 mL/hr over 30 Minutes Intravenous Every 8 hours 10/03/21 0613 10/06/21 1329   10/03/21 0645  vancomycin (VANCOREADY) IVPB 2000 mg/400 mL  Status:  Discontinued        2,000 mg 200 mL/hr over 120 Minutes Intravenous  Once 10/03/21 0549 10/03/21 0748   10/03/21 0645  ceFEPIme (MAXIPIME) 2 g in sodium chloride 0.9 % 100 mL IVPB  Status:  Discontinued        2 g 200 mL/hr over 30 Minutes Intravenous  Once 10/03/21 0549 10/03/21 1121   10/03/21 0600  vancomycin (VANCOREADY) IVPB 1500 mg/300 mL  Status:  Discontinued        1,500 mg 150 mL/hr over 120 Minutes Intravenous On call 10/02/21 1334 10/03/21 0549   09/11/21 1100  cefTRIAXone (ROCEPHIN) 2 g in sodium chloride 0.9 % 100 mL IVPB  2 g 200 mL/hr over 30 Minutes Intravenous Every 24 hours 09/11/21 1010 09/13/21 1108   09/08/21 0200  piperacillin-tazobactam (ZOSYN) IVPB 3.375 g   Status:  Discontinued        3.375 g 12.5 mL/hr over 240 Minutes Intravenous Every 8 hours 09/07/21 1856 09/07/21 2114   09/07/21 2200  meropenem (MERREM) 1 g in sodium chloride 0.9 % 100 mL IVPB  Status:  Discontinued        1 g 200 mL/hr over 30 Minutes Intravenous Every 8 hours 09/07/21 2114 09/11/21 1010   09/07/21 1945  piperacillin-tazobactam (ZOSYN) IVPB 3.375 g        3.375 g 100 mL/hr over 30 Minutes Intravenous  Once 09/07/21 1856 09/07/21 2005   09/07/21 1753  cefTRIAXone (ROCEPHIN) injection         Intravenous As needed 09/07/21 1754 09/07/21 1753   09/07/21 1749  sodium chloride 0.9 % with cefTRIAXone (ROCEPHIN) ADS Med       Note to Pharmacy: Eveline Keto E: cabinet override      09/07/21 1749 09/07/21 1935        Subjective:  Patient seen and examined.  Patient nonverbal due to tracheostomy.  Present at the bedside.  Patient appears to be alert and oriented.  Appears comfortable and has no complaints.   Objective: Vitals:   10/17/21 0344 10/17/21 0440 10/17/21 0700 10/17/21 0826  BP:  119/69  119/69  Pulse: (!) 113  93 95  Resp: (!) 29  17 16   Temp:  99.4 F (37.4 C)  98.2 F (36.8 C)  TempSrc:  Axillary  Oral  SpO2: 95%  97% 98%  Weight:      Height:        Intake/Output Summary (Last 24 hours) at 10/17/2021 1026 Last data filed at 10/17/2021 0338 Gross per 24 hour  Intake 2038 ml  Output 1400 ml  Net 638 ml    Filed Weights   10/10/21 1048 10/15/21 1344 10/15/21 1625  Weight: 128.9 kg 125.4 kg 125.4 kg    Examination:  General exam: Appears calm and comfortable  Respiratory system: Clear to auscultation. Respiratory effort normal.  Tracheostomy in place Cardiovascular system: S1 & S2 heard, RRR. No JVD, murmurs, rubs, gallops or clicks. No pedal edema. Gastrointestinal system: Abdomen is nondistended, soft and nontender. No organomegaly or masses felt. Normal bowel sounds heard. Central nervous system: Alert. Some weakness in bilateral upper  extremities. Psychiatry: Judgement and insight appear normal. Mood & affect appropriate.    Data Reviewed: I have personally reviewed following labs and imaging studies  CBC: Recent Labs  Lab 10/13/21 0306 10/14/21 0237 10/15/21 0312 10/16/21 0353 10/17/21 0325  WBC 5.9 6.3 6.4 3.8* 4.0  NEUTROABS 4.4 4.6 4.4 2.1 2.4  HGB 7.3* 7.6* 7.6* 7.7* 7.8*  HCT 24.5* 24.2* 25.5* 25.2* 25.9*  MCV 94.6 92.7 95.1 94.4 94.5  PLT 197 193 239 220 0000000    Basic Metabolic Panel: Recent Labs  Lab 10/11/21 0304 10/12/21 0312 10/13/21 0306 10/13/21 1400 10/14/21 0237 10/15/21 0312 10/16/21 0353 10/17/21 0325  NA 139 136 142 141  --  140  --   --   K 3.6 3.5 3.5 3.8  --  3.7  --   --   CL 110 103 106 107  --  105  --   --   CO2 22 24 27 28   --  27  --   --   GLUCOSE 216* 235* 113* 164*  --  117*  --   --  BUN 8 6 8 10   --  14  --   --   CREATININE 0.58* 0.45* 0.49* 0.52*  --  0.51*  --   --   CALCIUM 8.5* 8.5* 9.0 8.5*  --  8.8*  --   --   MG 1.8 1.9 1.9  --  1.8 1.9 2.0 2.0  PHOS 2.9 3.4 3.7  --  3.7 3.3 3.6 3.7    GFR: Estimated Creatinine Clearance: 136 mL/min (A) (by C-G formula based on SCr of 0.51 mg/dL (L)). Liver Function Tests: Recent Labs  Lab 10/11/21 0304 10/12/21 0312  AST 26 28  ALT 34 37  ALKPHOS 37* 47  BILITOT 0.8 0.6  PROT 4.7* 4.8*  ALBUMIN 3.7 3.6    No results for input(s): "LIPASE", "AMYLASE" in the last 168 hours.  No results for input(s): "AMMONIA" in the last 168 hours. Coagulation Profile: No results for input(s): "INR", "PROTIME" in the last 168 hours.  Cardiac Enzymes: No results for input(s): "CKTOTAL", "CKMB", "CKMBINDEX", "TROPONINI" in the last 168 hours. BNP (last 3 results) No results for input(s): "PROBNP" in the last 8760 hours. HbA1C: No results for input(s): "HGBA1C" in the last 72 hours. CBG: Recent Labs  Lab 10/16/21 1625 10/16/21 2125 10/16/21 2348 10/17/21 0434 10/17/21 0843  GLUCAP 137* 152* 167* 135* 145*     Lipid Profile: No results for input(s): "CHOL", "HDL", "LDLCALC", "TRIG", "CHOLHDL", "LDLDIRECT" in the last 72 hours. Thyroid Function Tests: No results for input(s): "TSH", "T4TOTAL", "FREET4", "T3FREE", "THYROIDAB" in the last 72 hours. Anemia Panel: No results for input(s): "VITAMINB12", "FOLATE", "FERRITIN", "TIBC", "IRON", "RETICCTPCT" in the last 72 hours. Sepsis Labs: No results for input(s): "PROCALCITON", "LATICACIDVEN" in the last 168 hours.   No results found for this or any previous visit (from the past 240 hour(s)).        Radiology Studies: No results found.      Scheduled Meds:  Chlorhexidine Gluconate Cloth  6 each Topical Daily   doxazosin  2 mg Per Tube Daily   feeding supplement (PROSource TF20)  60 mL Per Tube BID   free water  250 mL Per Tube Q6H   influenza vac split quadrivalent PF  0.5 mL Intramuscular Tomorrow-1000   insulin aspart  0-15 Units Subcutaneous Q4H   insulin aspart  10 Units Subcutaneous Q4H   insulin glargine-yfgn  40 Units Subcutaneous BID   nutrition supplement (JUVEN)  1 packet Per Tube BID   mouth rinse  15 mL Mouth Rinse 4 times per day   pantoprazole  40 mg Per Tube BID   pneumococcal 23 valent vaccine  0.5 mL Intramuscular Tomorrow-1000   rosuvastatin  40 mg Per Tube Daily   senna-docusate  1 tablet Per Tube BID   sodium chloride flush  10-40 mL Intracatheter Q12H   sodium chloride flush  5 mL Intracatheter Q8H   Zinc Oxide   Topical BID   Continuous Infusions:  sodium chloride 10 mL/hr at 10/10/21 1800   sodium chloride 250 mL (10/11/21 [id number])   sodium chloride Stopped (10/07/21 1454)   citrate dextrose     dextrose Stopped (10/04/21 1008)   dextrose     feeding supplement (VITAL 1.5 CAL) 1,000 mL (10/17/21 0452)   lactated ringers      Darliss Cheney, MD [facility] 10/17/2021, 10:26 AM

## 2021-10-17 NOTE — Progress Notes (Signed)
Patient seen today by trach team for consult.  No education is needed at this time.  All necessary equipment is at beside.   Will continue to follow for progression.  

## 2021-10-17 NOTE — Progress Notes (Signed)
Per order, RT x2 at bedside and removed #6 shiley flex cuffed trach and placed #4 shiley flex cuffless. Pt tolerated well with SVS and no complications. Positive color change was noted. Head of bed sheet was updated as well. All necessary trach equipment at bedside. RT will continue to monitor pt.

## 2021-10-18 DIAGNOSIS — G9341 Metabolic encephalopathy: Secondary | ICD-10-CM | POA: Diagnosis not present

## 2021-10-18 LAB — CBC WITH DIFFERENTIAL/PLATELET
Abs Immature Granulocytes: 0.01 K/uL (ref 0.00–0.07)
Basophils Absolute: 0 K/uL (ref 0.0–0.1)
Basophils Relative: 1 %
Eosinophils Absolute: 0.2 K/uL (ref 0.0–0.5)
Eosinophils Relative: 6 %
HCT: 25.9 % — ABNORMAL LOW (ref 39.0–52.0)
Hemoglobin: 8.1 g/dL — ABNORMAL LOW (ref 13.0–17.0)
Immature Granulocytes: 0 %
Lymphocytes Relative: 33 %
Lymphs Abs: 1.2 K/uL (ref 0.7–4.0)
MCH: 29 pg (ref 26.0–34.0)
MCHC: 31.3 g/dL (ref 30.0–36.0)
MCV: 92.8 fL (ref 80.0–100.0)
Monocytes Absolute: 0.4 K/uL (ref 0.1–1.0)
Monocytes Relative: 10 %
Neutro Abs: 1.8 K/uL (ref 1.7–7.7)
Neutrophils Relative %: 50 %
Platelets: 248 K/uL (ref 150–400)
RBC: 2.79 MIL/uL — ABNORMAL LOW (ref 4.22–5.81)
RDW: 16.6 % — ABNORMAL HIGH (ref 11.5–15.5)
WBC: 3.5 K/uL — ABNORMAL LOW (ref 4.0–10.5)
nRBC: 0 % (ref 0.0–0.2)

## 2021-10-18 LAB — MAGNESIUM: Magnesium: 2.1 mg/dL (ref 1.7–2.4)

## 2021-10-18 LAB — GLUCOSE, CAPILLARY
Glucose-Capillary: 175 mg/dL — ABNORMAL HIGH (ref 70–99)
Glucose-Capillary: 183 mg/dL — ABNORMAL HIGH (ref 70–99)
Glucose-Capillary: 114 mg/dL — ABNORMAL HIGH (ref 70–99)
Glucose-Capillary: 238 mg/dL — ABNORMAL HIGH (ref 70–99)
Glucose-Capillary: 201 mg/dL — ABNORMAL HIGH (ref 70–99)
Glucose-Capillary: 88 mg/dL (ref 70–99)

## 2021-10-18 LAB — PHOSPHORUS: Phosphorus: 4.2 mg/dL (ref 2.5–4.6)

## 2021-10-18 NOTE — Progress Notes (Signed)
PROGRESS NOTE    Troy Randall  JSE:[id number] DOB: [birth year] DOA: 09/04/2021 PCP: [name], MD   Brief Narrative:  61 years old male with medical history of hypertension, hyperlipidemia, type 2 diabetes, CVA, presented to the hospital with GI bleed.  Patient underwent IR embolization followed by percutaneous cholecystostomy tube placement due to ongoing altered mental status.  MRI of the brain showed numerous contrast-enhancing bilateral white matter lesions concerning for demyelinating disease.  Neurology was consulted and underwent lumbar puncture on 09/22/2021 with low suspicion for infectious etiology.  Neurology has initiated the patient on high-dose steroids on 09/23/2021.  Patient was then transferred to hospitalist service on 09/24/2021.  Significant events. 8/31 presented to [facility] ED, [name] Admit, GI Consult 9/1 CTA> no evidence of active GI bleed, 2 U PRBC 9/2 2 U PRBC, colonoscopy > no active bleed found but large clots throughout entire colon. EGD> small hiatal hernia, erosive gastropathy with no stigmata of recent bleeding, and non-bleeding duodenal diverticulum.   Underwent then small bowel enteroscopy which noted intermittent blood pumping in the proximal jejunum but was unable to be reached despite multiple attempts, was injected and clip placed. Repeat CTA repeated, which showed positive jejunal diverticular bleeding. IR> active extrav at [facility] territory, jejunal arcade branch, successful coil embo 9/3 Intubated. CTA> no evidence of active GI bleeding.  New finding of abnormal gallbladder with nondependent air, gallbladder wall air, pericholecystic and right upper quadrant information.  IR placed percutaneous cholecystotomy tube.  9/4 R. IJ central line placed with dialysis triple lumen catheter 9/5 CRRT initiated 9/6 Bedside EGD without any evidence of bleeding. CTA a/p without etiology for bleed 9/8 CRRT discontinued. Required ETT exchange yesterday due to blown cuff. 9/11  Bronchoscopic guided percutaneous tracheostomy placement.  Right IJ central line removed. 9/15 flexible sigmoidoscopy with removal of endoscopy 9/16 MRI brain w/o contrast amended study secondary to range of motion.  Rapid development numerous round white matter lesions in both cerebral hemispheres.  Prior MRIs. 9/17 MRI brain: Numerous contrast-enhancing bilateral white matter lesions, consistent with active demyelination. 9/18 Lumbar puncture  9/19 MRI cervical spine, thoracic : Limited exam, within limitations there is not definitive evidence of active demyelinization. Within limitations of motion artifact, there may be demyelinating lesions at the C6 and T1 vertebral body levels. 9/25-clinical exam not improved despite completion of 5-day IV steroids.  Neurology planning to repeat MRI of the brain cervical spine thoracic spine. Possible IR consult for catheter placement for possible Plex therapy if abnormal scans 9/26- scans attempted with ativan 9/29-patient transferred to ICU for respiratory distress due to aspiration pneumonia with sepsis 10/2-patient transferred back to [facility]; started having GI bleed, Dr. Mann/Dr. Benson Norway reconsulted.  Underwent sigmoidoscopy on 10/07/2021. 10/2: Plasma exchange started  Assessment & Plan:   Acute metabolic encephalopathy --MRI brain showed bilateral white matter lesions concerning for active demyelinating disease -S/p IV steroids for 5 days with no significant improvement -Currently undergoing plasma exchange as per neurology, patient received 5 doses, last 1 on 10/15/2021.Marland Kitchen  No further recommendations from neurology.  Hemorrhagic shock/GI bleed -Patient has had multiple endoscopic procedures last month including EGD/capsule endoscopy/coil embolization of jejunal branch of SMA. -Possibly from stress ulceration/ischemic disease as per GI: Status post sigmoidoscopy on 10/07/2021 which showed single solitary ulcer in the sigmoid colon which was biopsied and  pathology shows chronic colitis. -Has had multiple packed red cell transfusion during this hospitalization. -Monitor H&H.  Hemoglobin has remained stable now between 7 and 8.  Transfuse packed red  cells if hemoglobin is less than 7.  Acute respiratory failure with hypoxia status post tracheostomy Aspiration pneumonia -Status post tracheostomy placement on 09/15/2020.  PCCM following weekly. -Patient had acute aspiration event on 10/03/2021 and was placed on broad-spectrum antibiotics: Trach cultures grew MRSA and E. coli.  Treated with Rocephin and vancomycin and completed therapy.  E. coli cholecystitis status post percutaneous cholecystostomy drain placement -Had percutaneous cholecystostomy drain placement on 09/07/2021.  Subsequently had completed course of antibiotics -Will eventually need outpatient surgery/IR follow-up  Transaminitis -Resolved.  Thrombocytopenia -Resolved.  Diabetes mellitus type 2 with hyperglycemia -Continue long acting insulin along with CBGs with SSI  Acute kidney injury: Resolved -CRRT discontinued on 09/12/2021  Hypokalemia--improved  Hypophosphatemia -Improved  Hypertension -Continue monitoring blood pressure.  Currently on doxazosin   Obesity -Outpatient follow-up  Stage II medial sacral ulcer: Not present on admission -Continue local wound care.  Acute urinary retention -Foley catheter removed on 10/13/2021.  Subsequently, he has had issues with retention.  We will try straight in and out cath: If still continues to have issues with retention, will have to place Foley catheter again. -Continue doxazosin  Moderate protein calorie malnutrition/poor p.o. intake: Patient on tube feed currently.  Nutrition recommends PEG tube placement.  DVT prophylaxis: SCDs, avoiding heparin products, due to hemorrhagic shock, hemoglobin is still borderline. Code Status: Full Family Communication: Brother at bedside  Disposition Plan: Status is:  Inpatient Remains inpatient appropriate because: Medically stable.  Pending placement to LTAC.    Consultants: GI/neurology/PCCM/general surgery/nephrology/IR  Procedures: As above  Antimicrobials:  Anti-infectives (From admission, onward)    Start     Dose/Rate Route Frequency Ordered Stop   10/06/21 1515  cefTRIAXone (ROCEPHIN) 2 g in sodium chloride 0.9 % 100 mL IVPB        2 g 200 mL/hr over 30 Minutes Intravenous Every 24 hours 10/06/21 1420 10/09/21 1756   10/04/21 0945  metroNIDAZOLE (FLAGYL) IVPB 500 mg  Status:  Discontinued        500 mg 100 mL/hr over 60 Minutes Intravenous Every 12 hours 10/04/21 0853 10/06/21 1329   10/03/21 2200  vancomycin (VANCOREADY) IVPB 1500 mg/300 mL        1,500 mg 150 mL/hr over 120 Minutes Intravenous Every 12 hours 10/03/21 0613 10/10/21 0025   10/03/21 0700  vancomycin (VANCOREADY) IVPB 2000 mg/400 mL        2,000 mg 200 mL/hr over 120 Minutes Intravenous  Once 10/03/21 0613 10/03/21 1149   10/03/21 0700  ceFEPIme (MAXIPIME) 2 g in sodium chloride 0.9 % 100 mL IVPB  Status:  Discontinued        2 g 200 mL/hr over 30 Minutes Intravenous Every 8 hours 10/03/21 0613 10/06/21 1329   10/03/21 0645  vancomycin (VANCOREADY) IVPB 2000 mg/400 mL  Status:  Discontinued        2,000 mg 200 mL/hr over 120 Minutes Intravenous  Once 10/03/21 0549 10/03/21 0748   10/03/21 0645  ceFEPIme (MAXIPIME) 2 g in sodium chloride 0.9 % 100 mL IVPB  Status:  Discontinued        2 g 200 mL/hr over 30 Minutes Intravenous  Once 10/03/21 0549 10/03/21 1121   10/03/21 0600  vancomycin (VANCOREADY) IVPB 1500 mg/300 mL  Status:  Discontinued        1,500 mg 150 mL/hr over 120 Minutes Intravenous On call 10/02/21 1334 10/03/21 0549   09/11/21 1100  cefTRIAXone (ROCEPHIN) 2 g in sodium chloride 0.9 % 100 mL IVPB  2 g 200 mL/hr over 30 Minutes Intravenous Every 24 hours 09/11/21 1010 09/13/21 1108   09/08/21 0200  piperacillin-tazobactam (ZOSYN) IVPB 3.375 g   Status:  Discontinued        3.375 g 12.5 mL/hr over 240 Minutes Intravenous Every 8 hours 09/07/21 1856 09/07/21 2114   09/07/21 2200  meropenem (MERREM) 1 g in sodium chloride 0.9 % 100 mL IVPB  Status:  Discontinued        1 g 200 mL/hr over 30 Minutes Intravenous Every 8 hours 09/07/21 2114 09/11/21 1010   09/07/21 1945  piperacillin-tazobactam (ZOSYN) IVPB 3.375 g        3.375 g 100 mL/hr over 30 Minutes Intravenous  Once 09/07/21 1856 09/07/21 2005   09/07/21 1753  cefTRIAXone (ROCEPHIN) injection         Intravenous As needed 09/07/21 1754 09/07/21 1753   09/07/21 1749  sodium chloride 0.9 % with cefTRIAXone (ROCEPHIN) ADS Med       Note to Pharmacy: Eveline Keto E: cabinet override      09/07/21 1749 09/07/21 1935        Subjective:  Seen and examined.  Alert and following commands.  Status quo.  Appears comfortable.   Objective: Vitals:   10/17/21 2353 10/18/21 0009 10/18/21 0404 10/18/21 0413  BP:  121/71  124/77  Pulse: 98 92 96   Resp: 19  (!) 25 20  Temp:    97.7 F (36.5 C)  TempSrc:    Axillary  SpO2: 94% 94% 95% 95%  Weight:      Height:        Intake/Output Summary (Last 24 hours) at 10/18/2021 1022 Last data filed at 10/18/2021 0603 Gross per 24 hour  Intake 565 ml  Output 2510 ml  Net -1945 ml    Filed Weights   10/10/21 1048 10/15/21 1344 10/15/21 1625  Weight: 128.9 kg 125.4 kg 125.4 kg    Examination:  General exam: Appears calm and comfortable  Respiratory system: Clear to auscultation. Respiratory effort normal.  Tracheostomy in place Cardiovascular system: S1 & S2 heard, RRR. No JVD, murmurs, rubs, gallops or clicks. No pedal edema. Gastrointestinal system: Abdomen is nondistended, soft and nontender. No organomegaly or masses felt. Normal bowel sounds heard. Central nervous system: Alert. Some weakness in bilateral upper extremities. Psychiatry: Judgement and insight appear normal. Mood & affect appropriate.    Data Reviewed: I  have personally reviewed following labs and imaging studies  CBC: Recent Labs  Lab 10/14/21 0237 10/15/21 0312 10/16/21 0353 10/17/21 0325 10/18/21 0309  WBC 6.3 6.4 3.8* 4.0 3.5*  NEUTROABS 4.6 4.4 2.1 2.4 1.8  HGB 7.6* 7.6* 7.7* 7.8* 8.1*  HCT 24.2* 25.5* 25.2* 25.9* 25.9*  MCV 92.7 95.1 94.4 94.5 92.8  PLT 193 239 220 233 676    Basic Metabolic Panel: Recent Labs  Lab 10/12/21 0312 10/13/21 0306 10/13/21 1400 10/14/21 0237 10/15/21 0312 10/16/21 0353 10/17/21 0325 10/18/21 0309  NA 136 142 141  --  140  --   --   --   K 3.5 3.5 3.8  --  3.7  --   --   --   CL 103 106 107  --  105  --   --   --   CO2 24 27 28   --  27  --   --   --   GLUCOSE 235* 113* 164*  --  117*  --   --   --   BUN 6 8  10  --  14  --   --   --   CREATININE 0.45* 0.49* 0.52*  --  0.51*  --   --   --   CALCIUM 8.5* 9.0 8.5*  --  8.8*  --   --   --   MG 1.9 1.9  --  1.8 1.9 2.0 2.0 2.1  PHOS 3.4 3.7  --  3.7 3.3 3.6 3.7 4.2    GFR: Estimated Creatinine Clearance: 136 mL/min (A) (by C-G formula based on SCr of 0.51 mg/dL (L)). Liver Function Tests: Recent Labs  Lab 10/12/21 0312  AST 28  ALT 37  ALKPHOS 47  BILITOT 0.6  PROT 4.8*  ALBUMIN 3.6    No results for input(s): "LIPASE", "AMYLASE" in the last 168 hours.  No results for input(s): "AMMONIA" in the last 168 hours. Coagulation Profile: No results for input(s): "INR", "PROTIME" in the last 168 hours.  Cardiac Enzymes: No results for input(s): "CKTOTAL", "CKMB", "CKMBINDEX", "TROPONINI" in the last 168 hours. BNP (last 3 results) No results for input(s): "PROBNP" in the last 8760 hours. HbA1C: No results for input(s): "HGBA1C" in the last 72 hours. CBG: Recent Labs  Lab 10/17/21 1612 10/17/21 2010 10/17/21 2357 10/18/21 0405 10/18/21 0843  GLUCAP 129* 122* 148* 175* 183*    Lipid Profile: No results for input(s): "CHOL", "HDL", "LDLCALC", "TRIG", "CHOLHDL", "LDLDIRECT" in the last 72 hours. Thyroid Function  Tests: No results for input(s): "TSH", "T4TOTAL", "FREET4", "T3FREE", "THYROIDAB" in the last 72 hours. Anemia Panel: No results for input(s): "VITAMINB12", "FOLATE", "FERRITIN", "TIBC", "IRON", "RETICCTPCT" in the last 72 hours. Sepsis Labs: No results for input(s): "PROCALCITON", "LATICACIDVEN" in the last 168 hours.   No results found for this or any previous visit (from the past 240 hour(s)).        Radiology Studies: No results found.      Scheduled Meds:  Chlorhexidine Gluconate Cloth  6 each Topical Daily   doxazosin  2 mg Per Tube Daily   feeding supplement (PROSource TF20)  60 mL Per Tube BID   free water  250 mL Per Tube Q6H   influenza vac split quadrivalent PF  0.5 mL Intramuscular Tomorrow-1000   insulin aspart  0-15 Units Subcutaneous Q4H   insulin aspart  10 Units Subcutaneous Q4H   insulin glargine-yfgn  40 Units Subcutaneous BID   nutrition supplement (JUVEN)  1 packet Per Tube BID   mouth rinse  15 mL Mouth Rinse 4 times per day   pantoprazole  40 mg Per Tube BID   pneumococcal 23 valent vaccine  0.5 mL Intramuscular Tomorrow-1000   rosuvastatin  40 mg Per Tube Daily   senna-docusate  1 tablet Per Tube BID   sodium chloride flush  10-40 mL Intracatheter Q12H   sodium chloride flush  5 mL Intracatheter Q8H   Zinc Oxide   Topical BID   Continuous Infusions:  sodium chloride 10 mL/hr at 10/10/21 1800   sodium chloride 250 mL (10/11/21 [id number])   sodium chloride Stopped (10/07/21 1454)   citrate dextrose     dextrose Stopped (10/04/21 1008)   dextrose     feeding supplement (OSMOLITE 1.5 CAL) 1,000 mL (10/17/21 2318)   lactated ringers      Darliss Cheney, MD [facility] 10/18/2021, 10:22 AM

## 2021-10-19 DIAGNOSIS — G9341 Metabolic encephalopathy: Secondary | ICD-10-CM | POA: Diagnosis not present

## 2021-10-19 LAB — GLUCOSE, CAPILLARY
Glucose-Capillary: 63 mg/dL — ABNORMAL LOW (ref 70–99)
Glucose-Capillary: 104 mg/dL — ABNORMAL HIGH (ref 70–99)
Glucose-Capillary: 93 mg/dL (ref 70–99)
Glucose-Capillary: 87 mg/dL (ref 70–99)
Glucose-Capillary: 115 mg/dL — ABNORMAL HIGH (ref 70–99)
Glucose-Capillary: 91 mg/dL (ref 70–99)

## 2021-10-19 LAB — CBC WITH DIFFERENTIAL/PLATELET
Abs Immature Granulocytes: 0.04 10*3/uL (ref 0.00–0.07)
Basophils Absolute: 0 10*3/uL (ref 0.0–0.1)
Basophils Relative: 1 %
Eosinophils Absolute: 0.1 10*3/uL (ref 0.0–0.5)
Eosinophils Relative: 2 %
HCT: 28.2 % — ABNORMAL LOW (ref 39.0–52.0)
Hemoglobin: 8.6 g/dL — ABNORMAL LOW (ref 13.0–17.0)
Immature Granulocytes: 1 %
Lymphocytes Relative: 22 %
Lymphs Abs: 1.1 10*3/uL (ref 0.7–4.0)
MCH: 28.5 pg (ref 26.0–34.0)
MCHC: 30.5 g/dL (ref 30.0–36.0)
MCV: 93.4 fL (ref 80.0–100.0)
Monocytes Absolute: 0.6 10*3/uL (ref 0.1–1.0)
Monocytes Relative: 11 %
Neutro Abs: 3.2 10*3/uL (ref 1.7–7.7)
Neutrophils Relative %: 63 %
Platelets: 279 10*3/uL (ref 150–400)
RBC: 3.02 MIL/uL — ABNORMAL LOW (ref 4.22–5.81)
RDW: 16.4 % — ABNORMAL HIGH (ref 11.5–15.5)
WBC: 5 10*3/uL (ref 4.0–10.5)
nRBC: 0 % (ref 0.0–0.2)

## 2021-10-19 LAB — QUANTIFERON-TB GOLD PLUS (RQFGPL)
QuantiFERON Mitogen Value: >10 IU/mL
QuantiFERON Nil Value: 0.07 IU/mL
QuantiFERON TB1 Ag Value: 0.08 IU/mL
QuantiFERON TB2 Ag Value: 0.08 IU/mL

## 2021-10-19 LAB — PHOSPHORUS: Phosphorus: 5.1 mg/dL — ABNORMAL HIGH (ref 2.5–4.6)

## 2021-10-19 LAB — MAGNESIUM: Magnesium: 2.1 mg/dL (ref 1.7–2.4)

## 2021-10-19 LAB — QUANTIFERON-TB GOLD PLUS: QuantiFERON-TB Gold Plus: NEGATIVE

## 2021-10-19 MED ORDER — DEXTROSE 50 % IV SOLN
INTRAVENOUS | Status: AC
  Administered 2021-10-19: 50 mL
  Filled 2021-10-19: qty 50

## 2021-10-19 MED ORDER — PANCRELIPASE (LIP-PROT-AMYL) 10440-39150 UNITS PO TABS
20880.0000 [IU] | ORAL_TABLET | Freq: Once | ORAL | Status: AC
  Administered 2021-10-19: 20880 [IU]
  Filled 2021-10-19: qty 2

## 2021-10-19 MED ORDER — INSULIN GLARGINE-YFGN 100 UNIT/ML ~~LOC~~ SOLN
15.0000 [IU] | Freq: Two times a day (BID) | SUBCUTANEOUS | Status: DC
  Administered 2021-10-19: 15 [IU] via SUBCUTANEOUS
  Filled 2021-10-19 (×5): qty 0.15

## 2021-10-19 MED ORDER — ACETAMINOPHEN 10 MG/ML IV SOLN
1000.0000 mg | Freq: Once | INTRAVENOUS | Status: AC
  Administered 2021-10-19: 1000 mg via INTRAVENOUS
  Filled 2021-10-19: qty 100

## 2021-10-19 MED ORDER — SODIUM BICARBONATE 650 MG PO TABS
650.0000 mg | ORAL_TABLET | Freq: Once | ORAL | Status: AC
  Administered 2021-10-19: 650 mg
  Filled 2021-10-19: qty 1

## 2021-10-19 NOTE — Progress Notes (Signed)
Patient's cortrak tube was non functioning on assessment.   Additionally, he is having intermittent agitation where he is thrashing around in the bed and kicks during which his heart rate runs tachycardic with rate of 120-140's. The patient is unable to clearly communicate his needs. The source of agitation is unclear.  Of note, his foley catheter is leaking.  A bladder scan was attempted yet unsuccessful. Tylenol IV was given and this seemed to provide the patient relief as he is currently asleep with heart rate at 106. The provider has been made aware of the aforementioned details.  Will continue to monitor patient for signs of agitation, urine output, and potential sources of discomfort.

## 2021-10-19 NOTE — Progress Notes (Signed)
Hypoglycemic Event  CBG: 63  Treatment: D50 50 mL (25 gm)  Symptoms: None  Follow-up CBG: Time:0355 CBG Result:104  Possible Reasons for Event: Inadequate meal intake  Comments/MD notified: MD aware tube feeding on hold    Tessia Kassin, Blondell Reveal

## 2021-10-19 NOTE — Progress Notes (Signed)
Unsuccessful declog of cortrak. Per-MD cortrak to stay and cortrak team will reassess 10/16

## 2021-10-19 NOTE — Progress Notes (Signed)
PROGRESS NOTE    Troy Randall  JSE:[id number] DOB: [birth year] DOA: 09/04/2021 PCP: [name], MD   Brief Narrative:  61 years old male with medical history of hypertension, hyperlipidemia, type 2 diabetes, CVA, presented to the hospital with GI bleed.  Patient underwent IR embolization followed by percutaneous cholecystostomy tube placement due to ongoing altered mental status.  MRI of the brain showed numerous contrast-enhancing bilateral white matter lesions concerning for demyelinating disease.  Neurology was consulted and underwent lumbar puncture on 09/22/2021 with low suspicion for infectious etiology.  Neurology has initiated the patient on high-dose steroids on 09/23/2021.  Patient was then transferred to hospitalist service on 09/24/2021.  Significant events. 8/31 presented to [facility] ED, [name] Admit, GI Consult 9/1 CTA> no evidence of active GI bleed, 2 U PRBC 9/2 2 U PRBC, colonoscopy > no active bleed found but large clots throughout entire colon. EGD> small hiatal hernia, erosive gastropathy with no stigmata of recent bleeding, and non-bleeding duodenal diverticulum.   Underwent then small bowel enteroscopy which noted intermittent blood pumping in the proximal jejunum but was unable to be reached despite multiple attempts, was injected and clip placed. Repeat CTA repeated, which showed positive jejunal diverticular bleeding. IR> active extrav at [facility] territory, jejunal arcade branch, successful coil embo 9/3 Intubated. CTA> no evidence of active GI bleeding.  New finding of abnormal gallbladder with nondependent air, gallbladder wall air, pericholecystic and right upper quadrant information.  IR placed percutaneous cholecystotomy tube.  9/4 R. IJ central line placed with dialysis triple lumen catheter 9/5 CRRT initiated 9/6 Bedside EGD without any evidence of bleeding. CTA a/p without etiology for bleed 9/8 CRRT discontinued. Required ETT exchange yesterday due to blown cuff. 9/11  Bronchoscopic guided percutaneous tracheostomy placement.  Right IJ central line removed. 9/15 flexible sigmoidoscopy with removal of endoscopy 9/16 MRI brain w/o contrast amended study secondary to range of motion.  Rapid development numerous round white matter lesions in both cerebral hemispheres.  Prior MRIs. 9/17 MRI brain: Numerous contrast-enhancing bilateral white matter lesions, consistent with active demyelination. 9/18 Lumbar puncture  9/19 MRI cervical spine, thoracic : Limited exam, within limitations there is not definitive evidence of active demyelinization. Within limitations of motion artifact, there may be demyelinating lesions at the C6 and T1 vertebral body levels. 9/25-clinical exam not improved despite completion of 5-day IV steroids.  Neurology planning to repeat MRI of the brain cervical spine thoracic spine. Possible IR consult for catheter placement for possible Plex therapy if abnormal scans 9/26- scans attempted with ativan 9/29-patient transferred to ICU for respiratory distress due to aspiration pneumonia with sepsis 10/2-patient transferred back to [facility]; started having GI bleed, Dr. Mann/Dr. Benson Norway reconsulted.  Underwent sigmoidoscopy on 10/07/2021. 10/2: Plasma exchange started  Assessment & Plan:   Acute metabolic encephalopathy --MRI brain showed bilateral white matter lesions concerning for active demyelinating disease -S/p IV steroids for 5 days with no significant improvement -Currently undergoing plasma exchange as per neurology, patient received 5 doses, last 1 on 10/15/2021.Marland Kitchen  No further recommendations from neurology.  Hemorrhagic shock/GI bleed -Patient has had multiple endoscopic procedures last month including EGD/capsule endoscopy/coil embolization of jejunal branch of SMA. -Possibly from stress ulceration/ischemic disease as per GI: Status post sigmoidoscopy on 10/07/2021 which showed single solitary ulcer in the sigmoid colon which was biopsied and  pathology shows chronic colitis. -Has had multiple packed red cell transfusion during this hospitalization. -Monitor H&H.  Hemoglobin has remained stable now between 7 and 8.  Transfuse packed red  cells if hemoglobin is less than 7.  Acute respiratory failure with hypoxia status post tracheostomy Aspiration pneumonia -Status post tracheostomy placement on 09/15/2020.  PCCM following weekly. -Patient had acute aspiration event on 10/03/2021 and was placed on broad-spectrum antibiotics: Trach cultures grew MRSA and E. coli.  Treated with Rocephin and vancomycin and completed therapy.  E. coli cholecystitis status post percutaneous cholecystostomy drain placement -Had percutaneous cholecystostomy drain placement on 09/07/2021.  Subsequently had completed course of antibiotics -Will eventually need outpatient surgery/IR follow-up  Transaminitis -Resolved.  Thrombocytopenia -Resolved.  Diabetes mellitus type 2 with hyperglycemia -Continue long acting insulin along with CBGs with SSI  Acute kidney injury: Resolved -CRRT discontinued on 09/12/2021  Hypokalemia--improved  Hypophosphatemia -Improved  Hypertension -Continue monitoring blood pressure.  Currently on doxazosin   Obesity -Outpatient follow-up  Stage II medial sacral ulcer: Not present on admission -Continue local wound care.  Acute urinary retention -Foley catheter removed on 10/13/2021.  Subsequently, he has had issues with retention.  We will try straight in and out cath: If still continues to have issues with retention, will have to place Foley catheter again. -Continue doxazosin  Moderate protein calorie malnutrition/poor p.o. intake: Patient on tube feed currently.  Nutrition recommends PEG tube placement.  DVT prophylaxis: SCDs, avoiding heparin products, due to hemorrhagic shock, hemoglobin is still borderline. Code Status: Full Family Communication: Brother at bedside  Disposition Plan: Status is:  Inpatient Remains inpatient appropriate because: Medically stable.  Pending placement to LTAC.    Consultants: GI/neurology/PCCM/general surgery/nephrology/IR  Procedures: As above  Antimicrobials:  Anti-infectives (From admission, onward)    Start     Dose/Rate Route Frequency Ordered Stop   10/06/21 1515  cefTRIAXone (ROCEPHIN) 2 g in sodium chloride 0.9 % 100 mL IVPB        2 g 200 mL/hr over 30 Minutes Intravenous Every 24 hours 10/06/21 1420 10/09/21 1756   10/04/21 0945  metroNIDAZOLE (FLAGYL) IVPB 500 mg  Status:  Discontinued        500 mg 100 mL/hr over 60 Minutes Intravenous Every 12 hours 10/04/21 0853 10/06/21 1329   10/03/21 2200  vancomycin (VANCOREADY) IVPB 1500 mg/300 mL        1,500 mg 150 mL/hr over 120 Minutes Intravenous Every 12 hours 10/03/21 0613 10/10/21 0025   10/03/21 0700  vancomycin (VANCOREADY) IVPB 2000 mg/400 mL        2,000 mg 200 mL/hr over 120 Minutes Intravenous  Once 10/03/21 0613 10/03/21 1149   10/03/21 0700  ceFEPIme (MAXIPIME) 2 g in sodium chloride 0.9 % 100 mL IVPB  Status:  Discontinued        2 g 200 mL/hr over 30 Minutes Intravenous Every 8 hours 10/03/21 0613 10/06/21 1329   10/03/21 0645  vancomycin (VANCOREADY) IVPB 2000 mg/400 mL  Status:  Discontinued        2,000 mg 200 mL/hr over 120 Minutes Intravenous  Once 10/03/21 0549 10/03/21 0748   10/03/21 0645  ceFEPIme (MAXIPIME) 2 g in sodium chloride 0.9 % 100 mL IVPB  Status:  Discontinued        2 g 200 mL/hr over 30 Minutes Intravenous  Once 10/03/21 0549 10/03/21 1121   10/03/21 0600  vancomycin (VANCOREADY) IVPB 1500 mg/300 mL  Status:  Discontinued        1,500 mg 150 mL/hr over 120 Minutes Intravenous On call 10/02/21 1334 10/03/21 0549   09/11/21 1100  cefTRIAXone (ROCEPHIN) 2 g in sodium chloride 0.9 % 100 mL IVPB  2 g 200 mL/hr over 30 Minutes Intravenous Every 24 hours 09/11/21 1010 09/13/21 1108   09/08/21 0200  piperacillin-tazobactam (ZOSYN) IVPB 3.375 g   Status:  Discontinued        3.375 g 12.5 mL/hr over 240 Minutes Intravenous Every 8 hours 09/07/21 1856 09/07/21 2114   09/07/21 2200  meropenem (MERREM) 1 g in sodium chloride 0.9 % 100 mL IVPB  Status:  Discontinued        1 g 200 mL/hr over 30 Minutes Intravenous Every 8 hours 09/07/21 2114 09/11/21 1010   09/07/21 1945  piperacillin-tazobactam (ZOSYN) IVPB 3.375 g        3.375 g 100 mL/hr over 30 Minutes Intravenous  Once 09/07/21 1856 09/07/21 2005   09/07/21 1753  cefTRIAXone (ROCEPHIN) injection         Intravenous As needed 09/07/21 1754 09/07/21 1753   09/07/21 1749  sodium chloride 0.9 % with cefTRIAXone (ROCEPHIN) ADS Med       Note to Pharmacy: Eveline Keto E: cabinet override      09/07/21 1749 09/07/21 1935        Subjective:  Patient seen and examined.  Brother at the bedside.  Patient alert and appears to be oriented as he started talking today.  Following all commands.  Has significant weakness on the left upper extremity.  Able to move both lower extremities spontaneously.  I was informed by nursing that his core track has been clogged, they are working with SLP and nutrition to unclog it.   Objective: Vitals:   10/19/21 0449 10/19/21 0828 10/19/21 0900 10/19/21 0921  BP: 110/84 106/80    Pulse: 97 (!) 102  98  Resp:  19  (!) 22  Temp: 98 F (36.7 C) 97.6 F (36.4 C)    TempSrc: Oral Oral    SpO2: 100% 97% 98% 97%  Weight:      Height:        Intake/Output Summary (Last 24 hours) at 10/19/2021 1056 Last data filed at 10/19/2021 0335 Gross per 24 hour  Intake 3392.88 ml  Output 1600 ml  Net 1792.88 ml    Filed Weights   10/10/21 1048 10/15/21 1344 10/15/21 1625  Weight: 128.9 kg 125.4 kg 125.4 kg    Examination:  General exam: Appears calm and comfortable  Respiratory system: Clear to auscultation. Respiratory effort normal.  Tracheostomy in place Cardiovascular system: S1 & S2 heard, RRR. No JVD, murmurs, rubs, gallops or clicks. No pedal  edema. Gastrointestinal system: Abdomen is nondistended, soft and nontender. No organomegaly or masses felt. Normal bowel sounds heard. Central nervous system: Alert and appears to be oriented as well. Some weakness in bilateral upper extremities, more on the left than the right. Psychiatry: Judgement and insight appear normal. Mood & affect appropriate.    Data Reviewed: I have personally reviewed following labs and imaging studies  CBC: Recent Labs  Lab 10/15/21 0312 10/16/21 0353 10/17/21 0325 10/18/21 0309 10/19/21 0251  WBC 6.4 3.8* 4.0 3.5* 5.0  NEUTROABS 4.4 2.1 2.4 1.8 3.2  HGB 7.6* 7.7* 7.8* 8.1* 8.6*  HCT 25.5* 25.2* 25.9* 25.9* 28.2*  MCV 95.1 94.4 94.5 92.8 93.4  PLT 239 220 233 248 [id number]    Basic Metabolic Panel: Recent Labs  Lab 10/13/21 0306 10/13/21 1400 10/14/21 0237 10/15/21 0312 10/16/21 0353 10/17/21 0325 10/18/21 0309 10/19/21 0251  NA 142 141  --  140  --   --   --   --   K 3.5 3.8  --  3.7  --   --   --   --   CL 106 107  --  105  --   --   --   --   CO2 27 28  --  27  --   --   --   --   GLUCOSE 113* 164*  --  117*  --   --   --   --   BUN 8 10  --  14  --   --   --   --   CREATININE 0.49* 0.52*  --  0.51*  --   --   --   --   CALCIUM 9.0 8.5*  --  8.8*  --   --   --   --   MG 1.9  --    < > 1.9 2.0 2.0 2.1 2.1  PHOS 3.7  --    < > 3.3 3.6 3.7 4.2 5.1*   < > = values in this interval not displayed.    GFR: Estimated Creatinine Clearance: 136 mL/min (A) (by C-G formula based on SCr of 0.51 mg/dL (L)). Liver Function Tests: No results for input(s): "AST", "ALT", "ALKPHOS", "BILITOT", "PROT", "ALBUMIN" in the last 168 hours.  No results for input(s): "LIPASE", "AMYLASE" in the last 168 hours.  No results for input(s): "AMMONIA" in the last 168 hours. Coagulation Profile: No results for input(s): "INR", "PROTIME" in the last 168 hours.  Cardiac Enzymes: No results for input(s): "CKTOTAL", "CKMB", "CKMBINDEX", "TROPONINI" in the last 168  hours. BNP (last 3 results) No results for input(s): "PROBNP" in the last 8760 hours. HbA1C: No results for input(s): "HGBA1C" in the last 72 hours. CBG: Recent Labs  Lab 10/18/21 1922 10/18/21 2309 10/19/21 0307 10/19/21 0357 10/19/21 0831  GLUCAP 201* 88 63* 104* 93    Lipid Profile: No results for input(s): "CHOL", "HDL", "LDLCALC", "TRIG", "CHOLHDL", "LDLDIRECT" in the last 72 hours. Thyroid Function Tests: No results for input(s): "TSH", "T4TOTAL", "FREET4", "T3FREE", "THYROIDAB" in the last 72 hours. Anemia Panel: No results for input(s): "VITAMINB12", "FOLATE", "FERRITIN", "TIBC", "IRON", "RETICCTPCT" in the last 72 hours. Sepsis Labs: No results for input(s): "PROCALCITON", "LATICACIDVEN" in the last 168 hours.   No results found for this or any previous visit (from the past 240 hour(s)).        Radiology Studies: No results found.      Scheduled Meds:  Chlorhexidine Gluconate Cloth  6 each Topical Daily   doxazosin  2 mg Per Tube Daily   feeding supplement (PROSource TF20)  60 mL Per Tube BID   free water  250 mL Per Tube Q6H   influenza vac split quadrivalent PF  0.5 mL Intramuscular Tomorrow-1000   insulin aspart  0-15 Units Subcutaneous Q4H   insulin aspart  10 Units Subcutaneous Q4H   insulin glargine-yfgn  40 Units Subcutaneous BID   lipase/protease/amylase)  20,880 Units Per Tube Once   And   sodium bicarbonate  650 mg Per Tube Once   nutrition supplement (JUVEN)  1 packet Per Tube BID   mouth rinse  15 mL Mouth Rinse 4 times per day   pantoprazole  40 mg Per Tube BID   pneumococcal 23 valent vaccine  0.5 mL Intramuscular Tomorrow-1000   rosuvastatin  40 mg Per Tube Daily   senna-docusate  1 tablet Per Tube BID   sodium chloride flush  10-40 mL Intracatheter Q12H   sodium chloride flush  5 mL Intracatheter Q8H   Zinc  Oxide   Topical BID   Continuous Infusions:  sodium chloride 10 mL/hr at 10/18/21 1524   sodium chloride 250 mL (10/11/21  [id number])   sodium chloride Stopped (10/07/21 1454)   citrate dextrose     dextrose Stopped (10/04/21 1008)   dextrose     feeding supplement (OSMOLITE 1.5 CAL) 60 mL/hr at 10/18/21 1524   lactated ringers      Darliss Cheney, MD [facility] 10/19/2021, 10:56 AM

## 2021-10-19 NOTE — Progress Notes (Signed)
Was not successful in unclogging cortrak, insulin not given due to pt. Only able to eat 20% of meal. Doc notified

## 2021-10-19 NOTE — Progress Notes (Signed)
NUTRITION NOTE RD working remotely.  Page received from RN about Cortrak which became clogged during night shift. Night shift RN attempted to declog using soda and day shift nurse has attempted warm water and soda while using fingers to massage tube to break up clog. All attempts have been unsuccessful.  RD entered Declogging Protocol and discussed this protocol with RN. Informed RN that if this does not work, to alert MD so decision can be made as to if Cortrak to be removed and NGT placed at bedside. RN aware that this would be the best option as Cortrak service is not available until Tuesday, 10/17.    Troy Matin, MS, RD, LDN, CNSC Clinical Dietitian PRN/Relief staff On-call/weekend pager # available in [facility]

## 2021-10-20 ENCOUNTER — Inpatient Hospital Stay (HOSPITAL_COMMUNITY): Payer: Medicare HMO

## 2021-10-20 DIAGNOSIS — G9341 Metabolic encephalopathy: Secondary | ICD-10-CM | POA: Diagnosis not present

## 2021-10-20 LAB — CBC WITH DIFFERENTIAL/PLATELET
Abs Immature Granulocytes: 0.02 10*3/uL (ref 0.00–0.07)
Basophils Absolute: 0 10*3/uL (ref 0.0–0.1)
Basophils Relative: 1 %
Eosinophils Absolute: 0.2 10*3/uL (ref 0.0–0.5)
Eosinophils Relative: 4 %
HCT: 29.1 % — ABNORMAL LOW (ref 39.0–52.0)
Hemoglobin: 8.8 g/dL — ABNORMAL LOW (ref 13.0–17.0)
Immature Granulocytes: 0 %
Lymphocytes Relative: 30 %
Lymphs Abs: 1.5 10*3/uL (ref 0.7–4.0)
MCH: 28.3 pg (ref 26.0–34.0)
MCHC: 30.2 g/dL (ref 30.0–36.0)
MCV: 93.6 fL (ref 80.0–100.0)
Monocytes Absolute: 0.6 10*3/uL (ref 0.1–1.0)
Monocytes Relative: 12 %
Neutro Abs: 2.6 10*3/uL (ref 1.7–7.7)
Neutrophils Relative %: 53 %
Platelets: 275 10*3/uL (ref 150–400)
RBC: 3.11 MIL/uL — ABNORMAL LOW (ref 4.22–5.81)
RDW: 16.5 % — ABNORMAL HIGH (ref 11.5–15.5)
WBC: 4.9 10*3/uL (ref 4.0–10.5)
nRBC: 0 % (ref 0.0–0.2)

## 2021-10-20 LAB — GLUCOSE, CAPILLARY
Glucose-Capillary: 113 mg/dL — ABNORMAL HIGH (ref 70–99)
Glucose-Capillary: 156 mg/dL — ABNORMAL HIGH (ref 70–99)
Glucose-Capillary: 239 mg/dL — ABNORMAL HIGH (ref 70–99)
Glucose-Capillary: 65 mg/dL — ABNORMAL LOW (ref 70–99)
Glucose-Capillary: 84 mg/dL (ref 70–99)
Glucose-Capillary: 91 mg/dL (ref 70–99)
Glucose-Capillary: 92 mg/dL (ref 70–99)

## 2021-10-20 LAB — BASIC METABOLIC PANEL
Anion gap: 9 (ref 5–15)
BUN: 12 mg/dL (ref 6–20)
CO2: 28 mmol/L (ref 22–32)
Calcium: 9.6 mg/dL (ref 8.9–10.3)
Chloride: 104 mmol/L (ref 98–111)
Creatinine, Ser: 0.64 mg/dL (ref 0.61–1.24)
GFR, Estimated: >60 mL/min (ref >60)
Glucose, Bld: 73 mg/dL (ref 70–99)
Potassium: 3.6 mmol/L (ref 3.5–5.1)
Sodium: 141 mmol/L (ref 135–145)

## 2021-10-20 LAB — PHOSPHORUS: Phosphorus: 5.3 mg/dL — ABNORMAL HIGH (ref 2.5–4.6)

## 2021-10-20 LAB — JC VIRUS DNA,PCR (WHOLE BLOOD): JC Virus DNA, PCR, Blood: NEGATIVE

## 2021-10-20 LAB — MAGNESIUM: Magnesium: 2.2 mg/dL (ref 1.7–2.4)

## 2021-10-20 MED ORDER — DEXTROSE 50 % IV SOLN
INTRAVENOUS | Status: AC
  Administered 2021-10-20: 50 mL
  Filled 2021-10-20: qty 50

## 2021-10-20 MED ORDER — INSULIN GLARGINE-YFGN 100 UNIT/ML ~~LOC~~ SOLN
10.0000 [IU] | Freq: Two times a day (BID) | SUBCUTANEOUS | Status: DC
  Administered 2021-10-20: 10 [IU] via SUBCUTANEOUS
  Filled 2021-10-20 (×3): qty 0.1

## 2021-10-20 MED ORDER — TAMSULOSIN HCL 0.4 MG PO CAPS
0.4000 mg | ORAL_CAPSULE | Freq: Every day | ORAL | Status: DC
  Administered 2021-10-20 – 2021-10-21 (×2): 0.4 mg via ORAL
  Filled 2021-10-20 (×3): qty 1

## 2021-10-20 MED ORDER — OSMOLITE 1.5 CAL PO LIQD
1000.0000 mL | ORAL | Status: DC
  Administered 2021-10-20 – 2021-10-21 (×2): 1000 mL
  Filled 2021-10-20: qty 1000

## 2021-10-20 NOTE — Progress Notes (Signed)
PROGRESS NOTE    CAREL CARRIER  JSE:[id number] DOB: [birth year] DOA: 09/04/2021 PCP: [name], MD   Brief Narrative:  61 years old male with medical history of hypertension, hyperlipidemia, type 2 diabetes, CVA, presented to the hospital with GI bleed.  Patient underwent IR embolization followed by percutaneous cholecystostomy tube placement due to ongoing altered mental status.  MRI of the brain showed numerous contrast-enhancing bilateral white matter lesions concerning for demyelinating disease.  Neurology was consulted and underwent lumbar puncture on 09/22/2021 with low suspicion for infectious etiology.  Neurology has initiated the patient on high-dose steroids on 09/23/2021.  Patient was then transferred to hospitalist service on 09/24/2021.  Significant events. 8/31 presented to [facility], [name] Admit, GI Consult 9/1 CTA> no evidence of active GI bleed, 2 U PRBC 9/2 2 U PRBC, colonoscopy > no active bleed found but large clots throughout entire colon. EGD> small hiatal hernia, erosive gastropathy with no stigmata of recent bleeding, and non-bleeding duodenal diverticulum.   Underwent then small bowel enteroscopy which noted intermittent blood pumping in the proximal jejunum but was unable to be reached despite multiple attempts, was injected and clip placed. Repeat CTA repeated, which showed positive jejunal diverticular bleeding. IR> active extrav at [facility] territory, jejunal arcade branch, successful coil embo 9/3 Intubated. CTA> no evidence of active GI bleeding.  New finding of abnormal gallbladder with nondependent air, gallbladder wall air, pericholecystic and right upper quadrant information.  IR placed percutaneous cholecystotomy tube.  9/4 R. IJ central line placed with dialysis triple lumen catheter 9/5 CRRT initiated 9/6 Bedside EGD without any evidence of bleeding. CTA a/p without etiology for bleed 9/8 CRRT discontinued. Required ETT exchange yesterday due to blown cuff. 9/11  Bronchoscopic guided percutaneous tracheostomy placement.  Right IJ central line removed. 9/15 flexible sigmoidoscopy with removal of endoscopy 9/16 MRI brain w/o contrast amended study secondary to range of motion.  Rapid development numerous round white matter lesions in both cerebral hemispheres.  Prior MRIs. 9/17 MRI brain: Numerous contrast-enhancing bilateral white matter lesions, consistent with active demyelination. 9/18 Lumbar puncture  9/19 MRI cervical spine, thoracic : Limited exam, within limitations there is not definitive evidence of active demyelinization. Within limitations of motion artifact, there may be demyelinating lesions at the C6 and T1 vertebral body levels. 9/25-clinical exam not improved despite completion of 5-day IV steroids.  Neurology planning to repeat MRI of the brain cervical spine thoracic spine. Possible IR consult for catheter placement for possible Plex therapy if abnormal scans 9/26- scans attempted with ativan 9/29-patient transferred to ICU for respiratory distress due to aspiration pneumonia with sepsis 10/2-patient transferred back to [facility]; started having GI bleed, Dr. Mann/Dr. Benson Norway reconsulted.  Underwent sigmoidoscopy on 10/07/2021. 10/2: Plasma exchange started  Assessment & Plan:   Acute metabolic encephalopathy --MRI brain showed bilateral white matter lesions concerning for active demyelinating disease -S/p IV steroids for 5 days with no significant improvement -Currently undergoing plasma exchange as per neurology, patient received 5 doses, last 1 on 10/15/2021.Marland Kitchen  No further recommendations from neurology.  Hemorrhagic shock/GI bleed -Patient has had multiple endoscopic procedures last month including EGD/capsule endoscopy/coil embolization of jejunal branch of SMA. -Possibly from stress ulceration/ischemic disease as per GI: Status post sigmoidoscopy on 10/07/2021 which showed single solitary ulcer in the sigmoid colon which was biopsied and  pathology shows chronic colitis. -Has had multiple packed red cell transfusion during this hospitalization. -Monitor H&H.  Hemoglobin has remained stable now between 7 and 8.  Transfuse packed red  cells if hemoglobin is less than 7.  Acute respiratory failure with hypoxia status post tracheostomy Aspiration pneumonia -Status post tracheostomy placement on 09/15/2020.  PCCM following weekly. -Patient had acute aspiration event on 10/03/2021 and was placed on broad-spectrum antibiotics: Trach cultures grew MRSA and E. coli.  Treated with Rocephin and vancomycin and completed therapy.  E. coli cholecystitis status post percutaneous cholecystostomy drain placement -Had percutaneous cholecystostomy drain placement on 09/07/2021.  Subsequently had completed course of antibiotics -Will eventually need outpatient surgery/IR follow-up  Transaminitis -Resolved.  Thrombocytopenia -Resolved.  Diabetes mellitus type 2 with hyperglycemia -Continue long acting insulin along with CBGs with SSI  Acute kidney injury: Resolved -CRRT discontinued on 09/12/2021  Hypokalemia--improved  Hypophosphatemia -Improved  Hypertension -Continue monitoring blood pressure.  Currently on doxazosin   Obesity -Outpatient follow-up  Stage II medial sacral ulcer: Not present on admission -Continue local wound care.  Acute urinary retention -Foley catheter removed on 10/13/2021.  Subsequently, he has had issues with retention.  We will try straight in and out cath: If still continues to have issues with retention, will have to place Foley catheter again. -Continue doxazosin  Moderate protein calorie malnutrition/poor p.o. intake/dysphagia: Patient on tube feed currently.  Patient core track was clogged yesterday, core track team has replaced tube today.  Feedings will be resumed.  Nutrition had recommended PEG tube placement but family wants to hold off to that as patient has now been started on dysphagia 1 diet and  per wife, he is starting to eat some and she is hopeful that he will not need a PEG tube.  IR is already on board and and talks with the family.  DVT prophylaxis: SCDs, avoiding heparin products, due to hemorrhagic shock, hemoglobin is now improving.  Will await for 1 to 2 days, if here, will consider chemical DVT prophylaxis dose. Code Status: Full Family Communication: Wife at bedside  Disposition Plan: Status is: Inpatient Remains inpatient appropriate because: Medically stable.  Pending placement to LTAC.  Consultants: GI/neurology/PCCM/general surgery/nephrology/IR  Procedures: As above  Antimicrobials:  Anti-infectives (From admission, onward)    Start     Dose/Rate Route Frequency Ordered Stop   10/06/21 1515  cefTRIAXone (ROCEPHIN) 2 g in sodium chloride 0.9 % 100 mL IVPB        2 g 200 mL/hr over 30 Minutes Intravenous Every 24 hours 10/06/21 1420 10/09/21 1756   10/04/21 0945  metroNIDAZOLE (FLAGYL) IVPB 500 mg  Status:  Discontinued        500 mg 100 mL/hr over 60 Minutes Intravenous Every 12 hours 10/04/21 0853 10/06/21 1329   10/03/21 2200  vancomycin (VANCOREADY) IVPB 1500 mg/300 mL        1,500 mg 150 mL/hr over 120 Minutes Intravenous Every 12 hours 10/03/21 0613 10/10/21 0025   10/03/21 0700  vancomycin (VANCOREADY) IVPB 2000 mg/400 mL        2,000 mg 200 mL/hr over 120 Minutes Intravenous  Once 10/03/21 0613 10/03/21 1149   10/03/21 0700  ceFEPIme (MAXIPIME) 2 g in sodium chloride 0.9 % 100 mL IVPB  Status:  Discontinued        2 g 200 mL/hr over 30 Minutes Intravenous Every 8 hours 10/03/21 0613 10/06/21 1329   10/03/21 0645  vancomycin (VANCOREADY) IVPB 2000 mg/400 mL  Status:  Discontinued        2,000 mg 200 mL/hr over 120 Minutes Intravenous  Once 10/03/21 0549 10/03/21 0748   10/03/21 0645  ceFEPIme (MAXIPIME) 2 g in sodium chloride  0.9 % 100 mL IVPB  Status:  Discontinued        2 g 200 mL/hr over 30 Minutes Intravenous  Once 10/03/21 0549 10/03/21 1121    10/03/21 0600  vancomycin (VANCOREADY) IVPB 1500 mg/300 mL  Status:  Discontinued        1,500 mg 150 mL/hr over 120 Minutes Intravenous On call 10/02/21 1334 10/03/21 0549   09/11/21 1100  cefTRIAXone (ROCEPHIN) 2 g in sodium chloride 0.9 % 100 mL IVPB        2 g 200 mL/hr over 30 Minutes Intravenous Every 24 hours 09/11/21 1010 09/13/21 1108   09/08/21 0200  piperacillin-tazobactam (ZOSYN) IVPB 3.375 g  Status:  Discontinued        3.375 g 12.5 mL/hr over 240 Minutes Intravenous Every 8 hours 09/07/21 1856 09/07/21 2114   09/07/21 2200  meropenem (MERREM) 1 g in sodium chloride 0.9 % 100 mL IVPB  Status:  Discontinued        1 g 200 mL/hr over 30 Minutes Intravenous Every 8 hours 09/07/21 2114 09/11/21 1010   09/07/21 1945  piperacillin-tazobactam (ZOSYN) IVPB 3.375 g        3.375 g 100 mL/hr over 30 Minutes Intravenous  Once 09/07/21 1856 09/07/21 2005   09/07/21 1753  cefTRIAXone (ROCEPHIN) injection         Intravenous As needed 09/07/21 1754 09/07/21 1753   09/07/21 1749  sodium chloride 0.9 % with cefTRIAXone (ROCEPHIN) ADS Med       Note to Pharmacy: Eveline Keto E: cabinet override      09/07/21 1749 09/07/21 1935        Subjective:  Patient seen and examined.  Brother at the bedside.  Patient alert and appears to be oriented as he started talking today.  Following all commands.  Has significant weakness on the left upper extremity.  Able to move both lower extremities spontaneously.  I was informed by nursing that his core track has been clogged, they are working with SLP and nutrition to unclog it.   Objective: Vitals:   10/20/21 0530 10/20/21 0815 10/20/21 0836 10/20/21 1226  BP: (!) 149/80 129/86 129/86 131/77  Pulse: 97 67 83   Resp: 18  18 20   Temp: 98.7 F (37.1 C) 97.8 F (36.6 C)    TempSrc:  Oral    SpO2: 95% 97% 95% 94%  Weight:      Height:        Intake/Output Summary (Last 24 hours) at 10/20/2021 1317 Last data filed at 10/20/2021 0550 Gross per  24 hour  Intake 205 ml  Output 805 ml  Net -600 ml    Filed Weights   10/10/21 1048 10/15/21 1344 10/15/21 1625  Weight: 128.9 kg 125.4 kg 125.4 kg    Examination:  General exam: Appears calm and comfortable  Respiratory system: Clear to auscultation. Respiratory effort normal.  Tracheostomy in place. Cardiovascular system: S1 & S2 heard, RRR. No JVD, murmurs, rubs, gallops or clicks. No pedal edema. Gastrointestinal system: Abdomen is nondistended, soft and nontender. No organomegaly or masses felt. Normal bowel sounds heard. Central nervous system: Alert and oriented.  Weakness in bilateral upper extremities, left greater than the right.  Data Reviewed: I have personally reviewed following labs and imaging studies  CBC: Recent Labs  Lab 10/16/21 0353 10/17/21 0325 10/18/21 0309 10/19/21 0251 10/20/21 0409  WBC 3.8* 4.0 3.5* 5.0 4.9  NEUTROABS 2.1 2.4 1.8 3.2 2.6  HGB 7.7* 7.8* 8.1* 8.6* 8.8*  HCT 25.2* 25.9* 25.9* 28.2* 29.1*  MCV 94.4 94.5 92.8 93.4 93.6  PLT 220 233 248 279 [id number]    Basic Metabolic Panel: Recent Labs  Lab 10/13/21 1400 10/14/21 0237 10/15/21 0312 10/16/21 0353 10/17/21 0325 10/18/21 0309 10/19/21 0251 10/20/21 0409  NA 141  --  140  --   --   --   --  141  K 3.8  --  3.7  --   --   --   --  3.6  CL 107  --  105  --   --   --   --  104  CO2 28  --  27  --   --   --   --  28  GLUCOSE 164*  --  117*  --   --   --   --  73  BUN 10  --  14  --   --   --   --  12  CREATININE 0.52*  --  0.51*  --   --   --   --  0.64  CALCIUM 8.5*  --  8.8*  --   --   --   --  9.6  MG  --    < > 1.9 2.0 2.0 2.1 2.1 2.2  PHOS  --    < > 3.3 3.6 3.7 4.2 5.1* 5.3*   < > = values in this interval not displayed.    GFR: Estimated Creatinine Clearance: 136 mL/min (by C-G formula based on SCr of 0.64 mg/dL). Liver Function Tests: No results for input(s): "AST", "ALT", "ALKPHOS", "BILITOT", "PROT", "ALBUMIN" in the last 168 hours.  No results for input(s):  "LIPASE", "AMYLASE" in the last 168 hours.  No results for input(s): "AMMONIA" in the last 168 hours. Coagulation Profile: No results for input(s): "INR", "PROTIME" in the last 168 hours.  Cardiac Enzymes: No results for input(s): "CKTOTAL", "CKMB", "CKMBINDEX", "TROPONINI" in the last 168 hours. BNP (last 3 results) No results for input(s): "PROBNP" in the last 8760 hours. HbA1C: No results for input(s): "HGBA1C" in the last 72 hours. CBG: Recent Labs  Lab 10/20/21 0004 10/20/21 0527 10/20/21 0604 10/20/21 0809 10/20/21 1143  GLUCAP 84 65* 113* 92 91    Lipid Profile: No results for input(s): "CHOL", "HDL", "LDLCALC", "TRIG", "CHOLHDL", "LDLDIRECT" in the last 72 hours. Thyroid Function Tests: No results for input(s): "TSH", "T4TOTAL", "FREET4", "T3FREE", "THYROIDAB" in the last 72 hours. Anemia Panel: No results for input(s): "VITAMINB12", "FOLATE", "FERRITIN", "TIBC", "IRON", "RETICCTPCT" in the last 72 hours. Sepsis Labs: No results for input(s): "PROCALCITON", "LATICACIDVEN" in the last 168 hours.   No results found for this or any previous visit (from the past 240 hour(s)).        Radiology Studies: DG Abd Portable 1V  Result Date: 10/20/2021 CLINICAL DATA:  Feeding tube placement EXAM: PORTABLE ABDOMEN - 1 VIEW COMPARISON:  CT 4 days ago. FINDINGS: Soft feeding tube enters the stomach, loops in the fundus and has its tip in the region of the antrum. IMPRESSION: Soft feeding tube tip in the region of the antrum of the stomach. Electronically Signed   By: Nelson Chimes M.D.   On: 10/20/2021 12:29        Scheduled Meds:  Chlorhexidine Gluconate Cloth  6 each Topical Daily   doxazosin  2 mg Per Tube Daily   feeding supplement (PROSource TF20)  60 mL Per Tube BID   free water  250 mL Per Tube Q6H  influenza vac split quadrivalent PF  0.5 mL Intramuscular Tomorrow-1000   insulin aspart  0-15 Units Subcutaneous Q4H   insulin glargine-yfgn  10 Units  Subcutaneous BID   nutrition supplement (JUVEN)  1 packet Per Tube BID   mouth rinse  15 mL Mouth Rinse 4 times per day   pantoprazole  40 mg Per Tube BID   pneumococcal 23 valent vaccine  0.5 mL Intramuscular Tomorrow-1000   rosuvastatin  40 mg Per Tube Daily   senna-docusate  1 tablet Per Tube BID   sodium chloride flush  10-40 mL Intracatheter Q12H   sodium chloride flush  5 mL Intracatheter Q8H   tamsulosin  0.4 mg Oral QPC breakfast   Zinc Oxide   Topical BID   Continuous Infusions:  sodium chloride 10 mL/hr at 10/18/21 1524   sodium chloride 250 mL (10/11/21 0633)   sodium chloride Stopped (10/07/21 1454)   citrate dextrose     dextrose Stopped (10/04/21 1008)   feeding supplement (OSMOLITE 1.5 CAL) 60 mL/hr at 10/18/21 1524   lactated ringers      Darliss Cheney, MD [facility] 10/20/2021, 1:17 PM

## 2021-10-20 NOTE — Procedures (Signed)
Cortrak  Person Inserting Tube:  Troy Randall, Troy Randall, RD Tube Type:  Cortrak - 43 inches Tube Size:  10 Tube Location:  Left nare Secured by: Bridle Technique Used to Measure Tube Placement:  Marking at nare/corner of mouth Cortrak Secured At:  80 cm   Cortrak Tube Team Note:  Consult received noticed to unclog a Cortrak feeding tube. This RD was unable to unclog the tube. Previous Cortrak was replaced with a new tube.   X-ray is required, abdominal x-ray has been ordered by the Cortrak team. Please confirm tube placement before using the Cortrak tube.   If the tube becomes dislodged please keep the tube and contact the Cortrak team at [email or web address] for replacement.  If after hours and replacement cannot be delayed, place a NG tube and confirm placement with an abdominal x-ray.    Troy Randall RD, LDN Clinical Dietitian See Shea Evans for contact information.

## 2021-10-20 NOTE — Progress Notes (Addendum)
R IJ dressing changed due to peeling of current dressing. Tegaderm dressing used to cover site. Tegaderm not suitable for this type of CL, as it does not offer adequate site protection around the lumens. Had to reinforce with medipore tape to maintain dressing integrity. Site C/D/I at this time.

## 2021-10-20 NOTE — Progress Notes (Signed)
10/20/2021  I have seen and evaluated the patient for trach f/u  S:  Looks great with 4-0 in place. Talking with PMV. Gradually increasing strength better on R.  O: Blood pressure 129/86, pulse 83, temperature 97.8 F (36.6 C), temperature source Oral, resp. rate 18, height 6' (1.829 m), weight 125.4 kg, SpO2 95 %.  No distress Small thin secretions Lungs clear Heart sounds regular  A:  Ill defined demyelinating disease causing persistent respiratory compromise and eventual tracheostomy  P:  Start capping trial Would decannulate 10/18 if tolerates. Discussed with patient and family Will see again 10/18   Erskine Emery MD [facility] Prefer epic messenger for cross cover needs If after hours, please call E-link

## 2021-10-20 NOTE — Progress Notes (Signed)
Nutrition Follow-up  DOCUMENTATION CODES:   Obesity unspecified  INTERVENTION:  48 hour calorie count initiated to determine necessity for long term nutrition access  Transition tube feeding via Cortrak to nocturnal feeds: Osmolite 1.5 at goal rate of 23ml starting at 1800-0800 (1093ml per day) Prosource TF20 60 ml BID  Provides 1735 kcal (78% of minimum estimated kcal needs), 106g gm protein (81% of minimum estimated protein needs), 800 ml free water daily  Continue Juven BID per tube  Recommend increasing bowel regimen given last BM x4 days ago  NUTRITION DIAGNOSIS:   Increased nutrient needs related to acute illness as evidenced by estimated needs.  Ongoing  GOAL:   Patient will meet greater than or equal to 90% of their needs  Goal unmet-TF held, plan to resume today  MONITOR:   TF tolerance  REASON FOR ASSESSMENT:   Consult Enteral/tube feeding initiation and management  ASSESSMENT:   Pt with PMH of HTN, HLD, DM, CVA on ASA/plavix admitted 8/31 from [facility] due to GI bleed.  08/31 - admit 09/02 - s/p colonoscopy showing no active bleed but large clots throughout entire colon; s/p EGD showing small hital hernia, erosive gastropathy, non-bleeding duodenal diverticulum; s/p small bowel enteroscopy showing jejunal diverticular bleeding s/p coil embolization  09/03 - intubated; new cholecystitis s/p IR for cholecystectomy tube  09/04 - CRRT start 09/08 - CRRT stop 09/11 - s/p trach 09/12 - Cortrak placed (tip in descending duodenum) 09/15 - s/p flexible sigmoidoscopy 09/18 - s/p LP 09/29 - concern for aspiration due to bilious discharge from trach site, TF held, transferred to ICU 09/30 - TF resumed 10/01 - s/p HD catheter placement for PLEX 10/02 - first PLEX 10/11- last PLEX session  IR following. Tentative plans for PEG tube placement 10/17. Pt's wife wanted to hold off for the weekend to see how he did with PO intake. Calorie count started today to assess  adequacy of pt's PO intake to better help determine need for long term nutrition access.    Pt's Cortrak became clogged over the weekend. Failed attempts to unclog per nursing. Cortrak replaced today- tip gastric per xray.  Adjusting TF to nocturnal as pt is now on a dysphagia 1 diet to allow for daytime PO.    10/13 s/p FEES - Per ST, given oral phase deficits/decreased control and pt's cognitive deficits, a conservative diet texture of puree (Dys 1), honey thick liquids via spoon or cup (attempted straw however, was unable) is recommended  ST bedside assessment today noted mild oral pocketing of puree eggs.  Pt's wife reports that he ate about 90% of his breakfast yesterday, 50-75% of his chicken and 100% of mashed potatoes for lunch, and 75% of his rice and meat for dinner.  Medications: SSI 0-15 units q4h, semglee 10 units BID, protonix, senna  Labs: phos 5.3,  CBG's 65-156 x24 hours  Diet Order:   Diet Order             DIET - DYS 1 Room service appropriate? No; Fluid consistency: Honey Thick  Diet effective now                   EDUCATION NEEDS:   No education needs have been identified at this time  Skin:  Skin Assessment: Skin Integrity Issues: Skin Integrity Issues:: Stage II Stage II: medial sacrum  Last BM:  10/12 (type 2)  Height:   Ht Readings from Last 1 Encounters:  10/06/21 6' (1.829 m)    Weight:  Wt Readings from Last 1 Encounters:  10/15/21 125.4 kg    Ideal Body Weight:  80.9 kg  BMI:  Body mass index is 37.49 kg/m.  Estimated Nutritional Needs:   Kcal:  2200-2500  Protein:  130-150 grams  Fluid:  >2 L/day  Clayborne Dana, RDN, LDN Clinical Nutrition

## 2021-10-20 NOTE — Inpatient Diabetes Management (Signed)
Inpatient Diabetes Program Recommendations  AACE/ADA: New Consensus Statement on Inpatient Glycemic Control (2015)  Target Ranges:  Prepandial:   less than 140 mg/dL      Peak postprandial:   less than 180 mg/dL (1-2 hours)      Critically ill patients:  140 - 180 mg/dL   Lab Results  Component Value Date   GLUCAP 92 10/20/2021   HGBA1C 6.2 (H) 04/29/2021    Review of Glycemic Control  Latest Reference Range & Units 10/20/21 00:04 10/20/21 05:27 10/20/21 06:04 10/20/21 08:09  Glucose-Capillary 70 - 99 mg/dL 84 65 (L) 113 (H) 92  Current orders for Inpatient glycemic control:  Semglee 15 units bid, Novolog 0-15 units q 4 hours  Vital 60 ml/hr, Novolog 10 units q 4 hours  Inpatient Diabetes Program Recommendations:    Noted hypoglycemia this AM of 65 mg/dL and tube feeds paused due to clogged Cortrak.  At this time consider- -Decreasing Semglee to 10 units QHS -Discontinue tube feed coverage   Thanks, Bronson Curb, MSN, RNC-OB Diabetes Coordinator [phone] (8a-5p)

## 2021-10-20 NOTE — Progress Notes (Signed)
Speech Language Pathology Treatment: Dysphagia;Passy Muir Speaking valve  Patient Details Name: Troy Randall MRN: [id number] DOB: Jan 22, 1960 Today's Date: 10/20/2021 Time: 7829-5621 SLP Time Calculation (min) (ACUTE ONLY): 13 min  Assessment / Plan / Recommendation Clinical Impression  Therapy focused on PMV and swallow with wife at bedside feeding pt puree texture and honey thick liquid breakfast. On arrival, assisted pt to more upright position. He was restless throughout, moving to his right side and indications of likely sacral pain. PMV donned and he had spontaneous utterances x 2 with decreased intensity. His HR and SpO2 were within normal range and RR lead not reading but appeared to have a controlled RR on observation. Baseline cough noted on arrival as therapist donning PPE and wife reports he "coughs more in the morning." Delayed cough x 2 difficult to assess if due to po's. Mild oral pocketing of puree eggs with teaspoon sip honey thickened juice assisting in decrease volume and clearance. Provided verbal and visual education to wife re: watching for onset of swallow and attention to oral residue and verbalized understanding. Continue puree (Dys 1), honey thick liquids and may consider upgrade to nectar thick soon.   HPI HPI: Pt is a 61 y.o. male with history of stroke in 4/23, HTN, HLD and DM with recent severe jejunal bleed s/p vessel coiling who is being seen for altered mental status and concerning white matter changes on MRI. Patient's wife reports that after his stroke in 4/23 he made a good recovery and was able to perform ADLs. He had some residual dysarthria. Pt presented to [facility] on 8/31 with GI bleeding and has remained persistently altered. Intubated 9/3 and trach placed 9/11. He currently has #6 cuffed trach tolerating cuff deflated.      SLP Plan  Continue with current plan of care      Recommendations for follow up therapy are one component of a  multi-disciplinary discharge planning process, led by the attending physician.  Recommendations may be updated based on patient status, additional functional criteria and insurance authorization.    Recommendations  Diet recommendations: Dysphagia 1 (puree);Honey-thick liquid Liquids provided via: Cup;Teaspoon Medication Administration: Crushed with puree Compensations: Slow rate;Small sips/bites;Lingual sweep for clearance of pocketing;Monitor for anterior loss;Minimize environmental distractions Postural Changes and/or Swallow Maneuvers: Seated upright 90 degrees      Patient may use Passy-Muir Speech Valve: During all waking hours (remove during sleep) PMSV Supervision: Full         Oral Care Recommendations: Oral care BID Follow Up Recommendations: Skilled nursing-short term rehab (<3 hours/day) Assistance recommended at discharge: Frequent or constant Supervision/Assistance SLP Visit Diagnosis: Dysphagia, oropharyngeal phase (R13.12);Aphonia (R49.1) Plan: Continue with current plan of care           [name]  10/20/2021, 9:21 AM

## 2021-10-20 NOTE — Progress Notes (Signed)
Dr. Leonel Ramsay, neurology paged. Received orders to discontinue HD IJ, as therapy has been completed.

## 2021-10-20 NOTE — Progress Notes (Signed)
Hypoglycemic Event  CBG: 65  Treatment: D50 25 mL (12.5 gm)  Symptoms: None  Follow-up CBG: Time: 0604 CBG Result:113  Possible Reasons for Event: Inadequate meal intake  Comments/MD notified:     Troy Randall, Blondell Reveal

## 2021-10-20 NOTE — TOC Progression Note (Signed)
Transition of Care [facility]) - Progression Note    Patient Details  Name: Troy Randall MRN: [id number] Date of Birth: 08/23/1960  [facility] CM/SW Contact  Joanne Chars, LCSW Phone Number: 10/20/2021, 10:14 AM  Clinical Narrative:   Per [facility], no decision on insurance auth appeal.    Expected Discharge Plan: Long Term Acute Care (LTAC) Barriers to Discharge: Continued Medical Work up  Expected Discharge Plan and Services Expected Discharge Plan: [place] (LTAC)   Discharge Planning Services: CM Consult   Living arrangements for the past 2 months: Single Family Home                                       Social Determinants of Health (SDOH) Interventions    Readmission Risk Interventions     No data to display

## 2021-10-21 DIAGNOSIS — J9611 Chronic respiratory failure with hypoxia: Secondary | ICD-10-CM | POA: Diagnosis not present

## 2021-10-21 DIAGNOSIS — G9341 Metabolic encephalopathy: Secondary | ICD-10-CM | POA: Diagnosis not present

## 2021-10-21 LAB — MISC LABCORP TEST (SEND OUT): Labcorp test code: 9985

## 2021-10-21 LAB — CBC WITH DIFFERENTIAL/PLATELET
Abs Immature Granulocytes: 0.03 10*3/uL (ref 0.00–0.07)
Basophils Absolute: 0 10*3/uL (ref 0.0–0.1)
Basophils Relative: 1 %
Eosinophils Absolute: 0.2 10*3/uL (ref 0.0–0.5)
Eosinophils Relative: 3 %
HCT: 27.8 % — ABNORMAL LOW (ref 39.0–52.0)
Hemoglobin: 8.7 g/dL — ABNORMAL LOW (ref 13.0–17.0)
Immature Granulocytes: 1 %
Lymphocytes Relative: 25 %
Lymphs Abs: 1.4 10*3/uL (ref 0.7–4.0)
MCH: 29.2 pg (ref 26.0–34.0)
MCHC: 31.3 g/dL (ref 30.0–36.0)
MCV: 93.3 fL (ref 80.0–100.0)
Monocytes Absolute: 0.7 10*3/uL (ref 0.1–1.0)
Monocytes Relative: 13 %
Neutro Abs: 3.1 10*3/uL (ref 1.7–7.7)
Neutrophils Relative %: 57 %
Platelets: 265 10*3/uL (ref 150–400)
RBC: 2.98 MIL/uL — ABNORMAL LOW (ref 4.22–5.81)
RDW: 16 % — ABNORMAL HIGH (ref 11.5–15.5)
WBC: 5.4 10*3/uL (ref 4.0–10.5)
nRBC: 0 % (ref 0.0–0.2)

## 2021-10-21 LAB — MAGNESIUM: Magnesium: 2 mg/dL (ref 1.7–2.4)

## 2021-10-21 LAB — PHOSPHORUS: Phosphorus: 3.7 mg/dL (ref 2.5–4.6)

## 2021-10-21 LAB — GLUCOSE, CAPILLARY
Glucose-Capillary: 173 mg/dL — ABNORMAL HIGH (ref 70–99)
Glucose-Capillary: 204 mg/dL — ABNORMAL HIGH (ref 70–99)
Glucose-Capillary: 202 mg/dL — ABNORMAL HIGH (ref 70–99)
Glucose-Capillary: 158 mg/dL — ABNORMAL HIGH (ref 70–99)
Glucose-Capillary: 227 mg/dL — ABNORMAL HIGH (ref 70–99)
Glucose-Capillary: 272 mg/dL — ABNORMAL HIGH (ref 70–99)

## 2021-10-21 MED ORDER — ENOXAPARIN SODIUM 40 MG/0.4ML IJ SOSY
40.0000 mg | PREFILLED_SYRINGE | INTRAMUSCULAR | Status: DC
  Administered 2021-10-21 – 2021-10-29 (×9): 40 mg via SUBCUTANEOUS
  Filled 2021-10-21 (×9): qty 0.4

## 2021-10-21 MED ORDER — POLYETHYLENE GLYCOL 3350 17 G PO PACK
17.0000 g | PACK | Freq: Every day | ORAL | Status: DC
  Filled 2021-10-21 (×2): qty 1

## 2021-10-21 MED ORDER — DOCUSATE SODIUM 100 MG PO CAPS
100.0000 mg | ORAL_CAPSULE | Freq: Two times a day (BID) | ORAL | Status: DC
  Administered 2021-10-24 – 2021-10-28 (×6): 100 mg via ORAL
  Filled 2021-10-21 (×15): qty 1

## 2021-10-21 MED ORDER — INSULIN GLARGINE-YFGN 100 UNIT/ML ~~LOC~~ SOLN
15.0000 [IU] | Freq: Two times a day (BID) | SUBCUTANEOUS | Status: DC
  Administered 2021-10-21 (×2): 15 [IU] via SUBCUTANEOUS
  Filled 2021-10-21 (×4): qty 0.15

## 2021-10-21 NOTE — Progress Notes (Signed)
   NAME:  Troy Randall, MRN:  [id number], DOB:  [birth year], LOS: 69  Interval/subjective: -Red cap never placed yesterday 10/16 -Per the patient's wife apparently he displaced his trach earlier today, repositioned and replaced.  I do not see any documentation of this -Red cap was placed this am 10/17 at 11:30  Vitals:   10/21/21 0739 10/21/21 0820 10/21/21 1106 10/21/21 1125  BP:  121/82 112/73   Pulse: 92 87 92 88  Resp: 20 17 15 16   Temp:  98.9 F (37.2 C) 98.6 F (37 C)   TempSrc:  Oral Oral   SpO2: 99% 96% 100% 96%  Weight:      Height:      Comfortable laying in bed.  No distress Oropharynx clear, pupils equal, NG tube in place. Trach CDI with a red cap in place.  No upper airway secretions or stridor/upper airway noise Lungs clear bilaterally.   Heart regular without a murmur Abdomen soft, nondistended with positive bowel sounds. Trace pretibial edema Intermittently followed commands.  He did track.  Responds with single words.  Did not cough on command  Impression: Poorly characterized demyelinating disease with neurological compromise and persistent respiratory insufficiency.  Required tracheostomy but now overall improved.  Comfortable respiratory pattern.  Able to phonate.  He did not cough on command but no evidence of difficulty with secretions  Plans: Would leave red cap in place and assess for possible decannulation on 10/18 or 10/19.  Baltazar Apo, MD, PhD 10/21/2021, 1:36 PM [facility] [phone] or if no answer before 7:00PM call [phone] For any issues after 7:00PM please call eLink [phone]

## 2021-10-21 NOTE — Progress Notes (Signed)
Mobility Specialist Progress Note   10/21/21 1530  Mobility  Activity Transferred from chair to bed  Level of Assistance +2 (takes two people) (ModA)  Assistive Device Stedy  Activity Response Tolerated well  $Mobility charge 1 Mobility   RN requesting assistance to get pt from chair to bed d/t pt's decreased pain tolerance. Required increased time d/t cognitive delay and modA for initial stand out of chair + modA to get LE's back in bed. Pt left supine in bed with all needs met, and call bell in reach.  Holland Falling Mobility Specialist MS [facility] #:  [phone] Acute Rehab Office:  [phone]

## 2021-10-21 NOTE — Progress Notes (Addendum)
Calorie Count Note  Diet: dysphagia 1, honey thick Supplements: Magic cup TID with meals, each supplement provides 290 kcal and 9 grams of protein  10/16 Breakfast: 157 kcal and 8g protein Lunch: 198 kcal and 11g protein Dinner: 483 kcal and 23g protein  Total intake: 838 kcal (38% of minimum estimated needs)  42 protein (32% of minimum estimated needs)  Nutrition Dx: Increased nutrient needs related to acute illness as evidenced by estimated needs.  Goal: Patient will meet greater than or equal to 90% of their needs  Intervention:   Osmolite 1.5 at goal rate of 7ml starting at 1800-0800 (1058ml per day) Prosource TF20 60 ml BID   Provides 1735 kcal (78% of minimum estimated kcal needs), 106g gm protein (81% of minimum estimated protein needs), 800 ml free water daily   Continue Juven BID per tube   Recommend increasing bowel regimen given last BM x4 days ago  Recommend long term nutrition access to meet nutrition needs given inability to meet nutrition needs via PO intake alone   Clayborne Dana, RDN, LDN Clinical Nutrition

## 2021-10-21 NOTE — Progress Notes (Signed)
Pt's trach capped and pt is stable on room air at this time with o2 saturation 96%.  Will continue to monitor.

## 2021-10-21 NOTE — TOC Progression Note (Addendum)
Transition of Care [facility]) - Progression Note    Patient Details  Name: SHIVA SAHAGIAN MRN: [id number] Date of Birth: [birth year]  Transition of Care [facility]) CM/SW Contact  Joanne Chars, LCSW Phone Number: 10/21/2021, 1:18 PM  Clinical Narrative:   Message from [facility].  Auth appeal continues to be pending.   1540: Message from [facility].  Appeal was denied.  Reason given: LTAC would offer no additional services that are not available in the acute setting.  Expected Discharge Plan: Long Term Acute Care (LTAC) Barriers to Discharge: Continued Medical Work up  Expected Discharge Plan and Services Expected Discharge Plan: [place] (LTAC)   Discharge Planning Services: CM Consult   Living arrangements for the past 2 months: Single Family Home                                       Social Determinants of Health (SDOH) Interventions    Readmission Risk Interventions     No data to display

## 2021-10-21 NOTE — Plan of Care (Signed)
  Problem: Education: Goal: Knowledge of General Education information will improve Description: Including pain rating scale, medication(s)/side effects and non-pharmacologic comfort measures Outcome: Progressing   Problem: Clinical Measurements: Goal: Will remain free from infection Outcome: Progressing Goal: Diagnostic test results will improve Outcome: Progressing   Problem: Nutrition: Goal: Adequate nutrition will be maintained Outcome: Progressing   Problem: Coping: Goal: Level of anxiety will decrease Outcome: Progressing   

## 2021-10-21 NOTE — Progress Notes (Signed)
PROGRESS NOTE    Troy Randall  JSE:[id number] DOB: [birth year] DOA: 09/04/2021 PCP: [name], MD   Brief Narrative:  61 years old male with medical history of hypertension, hyperlipidemia, type 2 diabetes, CVA, presented to the hospital with GI bleed.  Patient underwent IR embolization followed by percutaneous cholecystostomy tube placement due to ongoing altered mental status.  MRI of the brain showed numerous contrast-enhancing bilateral white matter lesions concerning for demyelinating disease.  Neurology was consulted and underwent lumbar puncture on 09/22/2021 with low suspicion for infectious etiology.  Neurology has initiated the patient on high-dose steroids on 09/23/2021.  Patient was then transferred to hospitalist service on 09/24/2021.  Significant events. 8/31 presented to [facility] ED, [name] Admit, GI Consult 9/1 CTA> no evidence of active GI bleed, 2 U PRBC 9/2 2 U PRBC, colonoscopy > no active bleed found but large clots throughout entire colon. EGD> small hiatal hernia, erosive gastropathy with no stigmata of recent bleeding, and non-bleeding duodenal diverticulum.   Underwent then small bowel enteroscopy which noted intermittent blood pumping in the proximal jejunum but was unable to be reached despite multiple attempts, was injected and clip placed. Repeat CTA repeated, which showed positive jejunal diverticular bleeding. IR> active extrav at [facility] territory, jejunal arcade branch, successful coil embo 9/3 Intubated. CTA> no evidence of active GI bleeding.  New finding of abnormal gallbladder with nondependent air, gallbladder wall air, pericholecystic and right upper quadrant information.  IR placed percutaneous cholecystotomy tube.  9/4 R. IJ central line placed with dialysis triple lumen catheter 9/5 CRRT initiated 9/6 Bedside EGD without any evidence of bleeding. CTA a/p without etiology for bleed 9/8 CRRT discontinued. Required ETT exchange yesterday due to blown cuff. 9/11  Bronchoscopic guided percutaneous tracheostomy placement.  Right IJ central line removed. 9/15 flexible sigmoidoscopy with removal of endoscopy 9/16 MRI brain w/o contrast amended study secondary to range of motion.  Rapid development numerous round white matter lesions in both cerebral hemispheres.  Prior MRIs. 9/17 MRI brain: Numerous contrast-enhancing bilateral white matter lesions, consistent with active demyelination. 9/18 Lumbar puncture  9/19 MRI cervical spine, thoracic : Limited exam, within limitations there is not definitive evidence of active demyelinization. Within limitations of motion artifact, there may be demyelinating lesions at the C6 and T1 vertebral body levels. 9/25-clinical exam not improved despite completion of 5-day IV steroids.  Neurology planning to repeat MRI of the brain cervical spine thoracic spine. Possible IR consult for catheter placement for possible Plex therapy if abnormal scans 9/26- scans attempted with ativan 9/29-patient transferred to ICU for respiratory distress due to aspiration pneumonia with sepsis 10/2-patient transferred back to [facility]; started having GI bleed, Dr. Mann/Dr. Benson Norway reconsulted.  Underwent sigmoidoscopy on 10/07/2021. 10/2: Plasma exchange started  Assessment & Plan:   Acute metabolic encephalopathy --MRI brain showed bilateral white matter lesions concerning for active demyelinating disease -S/p IV steroids for 5 days with no significant improvement -Currently undergoing plasma exchange as per neurology, patient received 5 doses, last 1 on 10/15/2021.Marland Kitchen  No further recommendations from neurology.  Hemorrhagic shock/GI bleed -Patient has had multiple endoscopic procedures last month including EGD/capsule endoscopy/coil embolization of jejunal branch of SMA. -Possibly from stress ulceration/ischemic disease as per GI: Status post sigmoidoscopy on 10/07/2021 which showed single solitary ulcer in the sigmoid colon which was biopsied and  pathology shows chronic colitis. -Has had multiple packed red cell transfusion during this hospitalization. -Monitor H&H.  Hemoglobin has remained stable now between 7 and 8.  Transfuse packed red  cells if hemoglobin is less than 7.  Acute respiratory failure with hypoxia status post tracheostomy Aspiration pneumonia -Status post tracheostomy placement on 09/15/2020.  PCCM following weekly. -Patient had acute aspiration event on 10/03/2021 and was placed on broad-spectrum antibiotics: Trach cultures grew MRSA and E. coli.  Treated with Rocephin and vancomycin and completed therapy.  Patient's tracheostomy is now capped.  He is on room air and saturating 96%.  E. coli cholecystitis status post percutaneous cholecystostomy drain placement -Had percutaneous cholecystostomy drain placement on 09/07/2021.  Subsequently had completed course of antibiotics -Will eventually need outpatient surgery/IR follow-up  Transaminitis -Resolved.  Thrombocytopenia -Resolved.  Diabetes mellitus type 2 with hyperglycemia -Continue long acting insulin along with CBGs with SSI  Acute kidney injury: Resolved -CRRT discontinued on 09/12/2021  Hypokalemia--improved  Hypophosphatemia -Improved  Hypertension -Continue monitoring blood pressure.  Currently on doxazosin   Obesity -Outpatient follow-up  Stage II medial sacral ulcer: Not present on admission -Continue local wound care.  Acute urinary retention -Foley catheter removed on 10/13/2021.  Subsequently, he has had issues with retention.  We will try straight in and out cath: If still continues to have issues with retention, will have to place Foley catheter again. -Continue doxazosin  Moderate protein calorie malnutrition/poor p.o. intake/dysphagia: Patient on tube feed currently.  Patient core track was clogged yesterday, core track team has replaced tube today.  Feedings will be resumed.  Nutrition had recommended PEG tube placement but family wants  to hold off to that as patient has now been started on dysphagia 1 diet and per wife, he is starting to eat some and she is hopeful that he will not need a PEG tube.  IR is already on board and and talks with the family.  DVT prophylaxis: SCDs, we were avoiding heparin products, due to hemorrhagic shock, hemoglobin is now improving.  Will now start him on DVT prophylaxis Lovenox dose. Code Status: Full Family Communication: Wife at bedside  Disposition Plan: Status is: Inpatient Remains inpatient appropriate because: Medically stable.  Pending placement to LTAC.  Consultants: GI/neurology/PCCM/general surgery/nephrology/IR  Procedures: As above  Antimicrobials:  Anti-infectives (From admission, onward)    Start     Dose/Rate Route Frequency Ordered Stop   10/06/21 1515  cefTRIAXone (ROCEPHIN) 2 g in sodium chloride 0.9 % 100 mL IVPB        2 g 200 mL/hr over 30 Minutes Intravenous Every 24 hours 10/06/21 1420 10/09/21 1756   10/04/21 0945  metroNIDAZOLE (FLAGYL) IVPB 500 mg  Status:  Discontinued        500 mg 100 mL/hr over 60 Minutes Intravenous Every 12 hours 10/04/21 0853 10/06/21 1329   10/03/21 2200  vancomycin (VANCOREADY) IVPB 1500 mg/300 mL        1,500 mg 150 mL/hr over 120 Minutes Intravenous Every 12 hours 10/03/21 0613 10/10/21 0025   10/03/21 0700  vancomycin (VANCOREADY) IVPB 2000 mg/400 mL        2,000 mg 200 mL/hr over 120 Minutes Intravenous  Once 10/03/21 0613 10/03/21 1149   10/03/21 0700  ceFEPIme (MAXIPIME) 2 g in sodium chloride 0.9 % 100 mL IVPB  Status:  Discontinued        2 g 200 mL/hr over 30 Minutes Intravenous Every 8 hours 10/03/21 0613 10/06/21 1329   10/03/21 0645  vancomycin (VANCOREADY) IVPB 2000 mg/400 mL  Status:  Discontinued        2,000 mg 200 mL/hr over 120 Minutes Intravenous  Once 10/03/21 0549 10/03/21 0748  10/03/21 0645  ceFEPIme (MAXIPIME) 2 g in sodium chloride 0.9 % 100 mL IVPB  Status:  Discontinued        2 g 200 mL/hr over 30  Minutes Intravenous  Once 10/03/21 0549 10/03/21 1121   10/03/21 0600  vancomycin (VANCOREADY) IVPB 1500 mg/300 mL  Status:  Discontinued        1,500 mg 150 mL/hr over 120 Minutes Intravenous On call 10/02/21 1334 10/03/21 0549   09/11/21 1100  cefTRIAXone (ROCEPHIN) 2 g in sodium chloride 0.9 % 100 mL IVPB        2 g 200 mL/hr over 30 Minutes Intravenous Every 24 hours 09/11/21 1010 09/13/21 1108   09/08/21 0200  piperacillin-tazobactam (ZOSYN) IVPB 3.375 g  Status:  Discontinued        3.375 g 12.5 mL/hr over 240 Minutes Intravenous Every 8 hours 09/07/21 1856 09/07/21 2114   09/07/21 2200  meropenem (MERREM) 1 g in sodium chloride 0.9 % 100 mL IVPB  Status:  Discontinued        1 g 200 mL/hr over 30 Minutes Intravenous Every 8 hours 09/07/21 2114 09/11/21 1010   09/07/21 1945  piperacillin-tazobactam (ZOSYN) IVPB 3.375 g        3.375 g 100 mL/hr over 30 Minutes Intravenous  Once 09/07/21 1856 09/07/21 2005   09/07/21 1753  cefTRIAXone (ROCEPHIN) injection         Intravenous As needed 09/07/21 1754 09/07/21 1753   09/07/21 1749  sodium chloride 0.9 % with cefTRIAXone (ROCEPHIN) ADS Med       Note to Pharmacy: Eveline Keto E: cabinet override      09/07/21 1749 09/07/21 1935        Subjective:  Patient seen and examined.  Wife at the bedside.  Patient has no complaints.   Objective: Vitals:   10/21/21 0739 10/21/21 0820 10/21/21 1106 10/21/21 1125  BP:  121/82 112/73   Pulse: 92 87 92 88  Resp: 20 17 15 16   Temp:  98.9 F (37.2 C) 98.6 F (37 C)   TempSrc:  Oral Oral   SpO2: 99% 96% 100% 96%  Weight:      Height:        Intake/Output Summary (Last 24 hours) at 10/21/2021 1306 Last data filed at 10/21/2021 0634 Gross per 24 hour  Intake 683.75 ml  Output 700 ml  Net -16.25 ml    Filed Weights   10/10/21 1048 10/15/21 1344 10/15/21 1625  Weight: 128.9 kg 125.4 kg 125.4 kg    Examination:  General exam: Appears calm and comfortable  Respiratory system:  Clear to auscultation. Respiratory effort normal. Cardiovascular system: S1 & S2 heard, RRR. No JVD, murmurs, rubs, gallops or clicks. No pedal edema. Gastrointestinal system: Abdomen is nondistended, soft and nontender. No organomegaly or masses felt. Normal bowel sounds heard. Central nervous system: Alert and oriented.  Weakness in bilateral upper extremities, 4/5 power in right upper extremity and 3/5 power in left upper extremity.   Data Reviewed: I have personally reviewed following labs and imaging studies  CBC: Recent Labs  Lab 10/17/21 0325 10/18/21 0309 10/19/21 0251 10/20/21 0409 10/21/21 0416  WBC 4.0 3.5* 5.0 4.9 5.4  NEUTROABS 2.4 1.8 3.2 2.6 3.1  HGB 7.8* 8.1* 8.6* 8.8* 8.7*  HCT 25.9* 25.9* 28.2* 29.1* 27.8*  MCV 94.5 92.8 93.4 93.6 93.3  PLT 233 248 279 275 [id number]    Basic Metabolic Panel: Recent Labs  Lab 10/15/21 0312 10/16/21 0353 10/17/21 0325 10/18/21 0309  10/19/21 0251 10/20/21 0409 10/21/21 0416  NA 140  --   --   --   --  141  --   K 3.7  --   --   --   --  3.6  --   CL 105  --   --   --   --  104  --   CO2 27  --   --   --   --  28  --   GLUCOSE 117*  --   --   --   --  73  --   BUN 14  --   --   --   --  12  --   CREATININE 0.51*  --   --   --   --  0.64  --   CALCIUM 8.8*  --   --   --   --  9.6  --   MG 1.9   < > 2.0 2.1 2.1 2.2 2.0  PHOS 3.3   < > 3.7 4.2 5.1* 5.3* 3.7   < > = values in this interval not displayed.    GFR: Estimated Creatinine Clearance: 136 mL/min (by C-G formula based on SCr of 0.64 mg/dL). Liver Function Tests: No results for input(s): "AST", "ALT", "ALKPHOS", "BILITOT", "PROT", "ALBUMIN" in the last 168 hours.  No results for input(s): "LIPASE", "AMYLASE" in the last 168 hours.  No results for input(s): "AMMONIA" in the last 168 hours. Coagulation Profile: No results for input(s): "INR", "PROTIME" in the last 168 hours.  Cardiac Enzymes: No results for input(s): "CKTOTAL", "CKMB", "CKMBINDEX", "TROPONINI" in  the last 168 hours. BNP (last 3 results) No results for input(s): "PROBNP" in the last 8760 hours. HbA1C: No results for input(s): "HGBA1C" in the last 72 hours. CBG: Recent Labs  Lab 10/20/21 2144 10/21/21 0235 10/21/21 0531 10/21/21 0826 10/21/21 1112  GLUCAP 239* 173* 204* 202* 158*    Lipid Profile: No results for input(s): "CHOL", "HDL", "LDLCALC", "TRIG", "CHOLHDL", "LDLDIRECT" in the last 72 hours. Thyroid Function Tests: No results for input(s): "TSH", "T4TOTAL", "FREET4", "T3FREE", "THYROIDAB" in the last 72 hours. Anemia Panel: No results for input(s): "VITAMINB12", "FOLATE", "FERRITIN", "TIBC", "IRON", "RETICCTPCT" in the last 72 hours. Sepsis Labs: No results for input(s): "PROCALCITON", "LATICACIDVEN" in the last 168 hours.   No results found for this or any previous visit (from the past 240 hour(s)).        Radiology Studies: DG Abd Portable 1V  Result Date: 10/20/2021 CLINICAL DATA:  Feeding tube placement EXAM: PORTABLE ABDOMEN - 1 VIEW COMPARISON:  CT 4 days ago. FINDINGS: Soft feeding tube enters the stomach, loops in the fundus and has its tip in the region of the antrum. IMPRESSION: Soft feeding tube tip in the region of the antrum of the stomach. Electronically Signed   By: Nelson Chimes M.D.   On: 10/20/2021 12:29        Scheduled Meds:  Chlorhexidine Gluconate Cloth  6 each Topical Daily   doxazosin  2 mg Per Tube Daily   feeding supplement (PROSource TF20)  60 mL Per Tube BID   free water  250 mL Per Tube Q6H   influenza vac split quadrivalent PF  0.5 mL Intramuscular Tomorrow-1000   insulin aspart  0-15 Units Subcutaneous Q4H   insulin glargine-yfgn  15 Units Subcutaneous BID   nutrition supplement (JUVEN)  1 packet Per Tube BID   mouth rinse  15 mL Mouth Rinse 4 times per day  pantoprazole  40 mg Per Tube BID   pneumococcal 23 valent vaccine  0.5 mL Intramuscular Tomorrow-1000   rosuvastatin  40 mg Per Tube Daily   senna-docusate  1  tablet Per Tube BID   sodium chloride flush  10-40 mL Intracatheter Q12H   sodium chloride flush  5 mL Intracatheter Q8H   tamsulosin  0.4 mg Oral QPC breakfast   Zinc Oxide   Topical BID   Continuous Infusions:  sodium chloride 10 mL/hr at 10/18/21 1524   sodium chloride 250 mL (10/11/21 3403)   sodium chloride Stopped (10/07/21 1454)   citrate dextrose     dextrose Stopped (10/04/21 1008)   feeding supplement (OSMOLITE 1.5 CAL) Stopped (10/21/21 0800)   lactated ringers      Darliss Cheney, MD [facility] 10/21/2021, 1:06 PM

## 2021-10-21 NOTE — Progress Notes (Signed)
RT called to patient room due to patient's trach being out.  Upon arrival, there were two RNs present in room.  One RN had placed spare trach in patient's room into patient.  RT verified with CO2 detector, with positive color change noted.  Patient's sats and vitals were stable upon arrival.  RN was to page MD.  Floor RT aware of events as well.  Will continue to monitor.

## 2021-10-21 NOTE — Plan of Care (Signed)
  Problem: Education: Goal: Knowledge of General Education information will improve Description: Including pain rating scale, medication(s)/side effects and non-pharmacologic comfort measures Outcome: Progressing   Problem: Health Behavior/Discharge Planning: Goal: Ability to manage health-related needs will improve Outcome: Progressing   Problem: Clinical Measurements: Goal: Ability to maintain clinical measurements within normal limits will improve Outcome: Progressing Goal: Will remain free from infection Outcome: Progressing Goal: Diagnostic test results will improve Outcome: Progressing Goal: Respiratory complications will improve Outcome: Progressing Goal: Cardiovascular complication will be avoided Outcome: Progressing   Problem: Activity: Goal: Risk for activity intolerance will decrease Outcome: Progressing   Problem: Nutrition: Goal: Adequate nutrition will be maintained Outcome: Progressing   Problem: Coping: Goal: Level of anxiety will decrease Outcome: Progressing   Problem: Elimination: Goal: Will not experience complications related to bowel motility Outcome: Progressing Goal: Will not experience complications related to urinary retention Outcome: Progressing   Problem: Pain Managment: Goal: General experience of comfort will improve Outcome: Progressing   Problem: Safety: Goal: Ability to remain free from injury will improve Outcome: Progressing   Problem: Skin Integrity: Goal: Risk for impaired skin integrity will decrease Outcome: Progressing   Problem: Education: Goal: Ability to describe self-care measures that may prevent or decrease complications (Diabetes Survival Skills Education) will improve Outcome: Progressing Goal: Individualized Educational Video(s) Outcome: Progressing   Problem: Coping: Goal: Ability to adjust to condition or change in health will improve Outcome: Progressing   Problem: Fluid Volume: Goal: Ability to  maintain a balanced intake and output will improve Outcome: Progressing   Problem: Health Behavior/Discharge Planning: Goal: Ability to identify and utilize available resources and services will improve Outcome: Progressing Goal: Ability to manage health-related needs will improve Outcome: Progressing   Problem: Metabolic: Goal: Ability to maintain appropriate glucose levels will improve Outcome: Progressing   Problem: Nutritional: Goal: Maintenance of adequate nutrition will improve Outcome: Progressing Goal: Progress toward achieving an optimal weight will improve Outcome: Progressing   Problem: Skin Integrity: Goal: Risk for impaired skin integrity will decrease Outcome: Progressing   Problem: Tissue Perfusion: Goal: Adequacy of tissue perfusion will improve Outcome: Progressing   Problem: Education: Goal: Understanding of CV disease, CV risk reduction, and recovery process will improve Outcome: Progressing Goal: Individualized Educational Video(s) Outcome: Progressing   Problem: Activity: Goal: Ability to return to baseline activity level will improve Outcome: Progressing   Problem: Cardiovascular: Goal: Ability to achieve and maintain adequate cardiovascular perfusion will improve Outcome: Progressing Goal: Vascular access site(s) Level 0-1 will be maintained Outcome: Progressing   Problem: Health Behavior/Discharge Planning: Goal: Ability to safely manage health-related needs after discharge will improve Outcome: Progressing   Problem: Education: Goal: Knowledge about tracheostomy care/management will improve Outcome: Progressing   Problem: Activity: Goal: Ability to tolerate increased activity will improve Outcome: Progressing   Problem: Health Behavior/Discharge Planning: Goal: Ability to manage tracheostomy will improve Outcome: Progressing   Problem: Respiratory: Goal: Patent airway maintenance will improve Outcome: Progressing   Problem: Role  Relationship: Goal: Ability to communicate will improve Outcome: Progressing   

## 2021-10-21 NOTE — Progress Notes (Signed)
Upon assessing patient's trach this AM, this nurse noted trach was out of tracheotomy site. Rapid and respiratory both notified. Additional nurse at bedside who placed obturator and eventually a new trach while waiting for respiratory. Respiratory at bedside to verify CO2. Patient stable at this time with new trach in place with ties secured and dressing c/d/I. MD notified.

## 2021-10-21 NOTE — Progress Notes (Signed)
Physical Therapy Treatment Patient Details Name: Troy Randall MRN: [id number] DOB: [birth year] Today's Date: 10/21/2021   History of Present Illness Patient is a 61 y/o male who presents on 09/04/21 with dark stools, dizziness and SOB. Found to have GI bleed and acute anemia s/p embolization 09/06/21 and percutaneous cholecystostomy 09/07/21. Intubated 9/3, CRRT 9/5-9/8. Hemorrhagic and septic shock 9/5. Trach placed 9/11. 9/16 MRI rapid development numerous round white matter lesions in both cerebral hemispheres; 9/17 MRI: numerous white matter lesions consistent with active demyelination - concerning for MS, ADEM, and cerebral vasculitis; 9/18 Lumbar puncture. Pt returned to ICU on 9/29 with aspiration PNA, tachypnea, tachycardia and increased WOB. 10/2 - pt transferred back to [facility]; started having GIB. Underwent sigmoidoscopy on 10/07/2021. Plasma exchange started. 10/17 trach capped. PMH includes CVA, DM, HTN.    PT Comments    Pt progressing with mobility. Today's session focused on sit to stand transfer and static standing balance during dependent pericare due to fecal incontinence. Intermittent HR spike to 120s for brief periods, returning to baseline in < 20 seconds. Excess cues necessary for functional tasks over prolonged period of time due to delayed processing speed demonstrated by pt. Pt remains limited by generalized weakness, decreased activity tolerance, cognitive deficits, and impaired balance strategies/postural reactions. Continue to recommend LTAC to maximize functional mobility and independence prior to return home.    Recommendations for follow up therapy are one component of a multi-disciplinary discharge planning process, led by the attending physician.  Recommendations may be updated based on patient status, additional functional criteria and insurance authorization.  Follow Up Recommendations  PT at Long-term acute care hospital     Assistance Recommended at Discharge Frequent  or constant Supervision/Assistance  Patient can return home with the following Two people to help with walking and/or transfers;Two people to help with bathing/dressing/bathroom;Assistance with cooking/housework;Assist for transportation;Help with stairs or ramp for entrance   Equipment Recommendations       Recommendations for Other Services       Precautions / Restrictions Precautions Precautions: Fall Precaution Comments: trach collar, cortrak, watch HR closely during mobility Restrictions Weight Bearing Restrictions: No Other Position/Activity Restrictions: pt unable to sit on bottom for extended periods of time due to pain (even on geomat)     Mobility  Bed Mobility Overal bed mobility: Needs Assistance Bed Mobility: Supine to Sit     Supine to sit: Mod assist, +2 for physical assistance, HOB elevated     General bed mobility comments: Pt required both verbal and tactile cues for attempted initiation to sit EOB, trialling of different verbiage necessary to complete expectation. Utilized pad under for scooting of hips. Pt continued to cross legs during session, unsure if due to restlessness or if baseline habit.    Transfers Overall transfer level: Needs assistance Equipment used: Ambulation equipment used Transfers: Sit to/from Stand, Bed to chair/wheelchair/BSC Sit to Stand: +2 physical assistance, Mod assist           General transfer comment: 3 trials of sit to stand during session today from various heights, mod A +2 with continuous cuing for hand and foot placement. Pt required excessive cuing to initiate sit portion of transfer Transfer via Lift Equipment: Stedy  Ambulation/Gait                   Stairs             Wheelchair Mobility    Modified Rankin (Stroke Patients Only)  Balance Overall balance assessment: Needs assistance Sitting-balance support: Feet supported, Bilateral upper extremity supported Sitting balance-Leahy  Scale: Fair Sitting balance - Comments: Sit EOB with pushing to R demonstrated, pt able to intermittently pick up one foot at a time while therapist donned socks Postural control: Right lateral lean Standing balance support: Bilateral upper extremity supported Standing balance-Leahy Scale: Poor Standing balance comment: stood x3 into Stedy with mod assist + 2, able to statically stand for several minutes in stedy during pericare, L knee beginning to buckle with onset of fatigue                            Cognition Arousal/Alertness: Awake/alert Behavior During Therapy: Flat affect Overall Cognitive Status: Impaired/Different from baseline Area of Impairment: Following commands, Attention, Safety/judgement                   Current Attention Level: Focused   Following Commands: Follows one step commands with increased time, Follows one step commands inconsistently Safety/Judgement: Decreased awareness of safety     General Comments: Pt responds best to yes/no questions with increase time. If presented two options, pt is not able to present an appropriate answer. Poor attention during session with no ability to demonstrate dual task        Exercises      General Comments General comments (skin integrity, edema, etc.): Wife very helpful, SpO2 >93% RA when pleth reliable. HR increased to 120s for brief periods of time during mobility, return to baseline in 80s-90s with active rest break      Pertinent Vitals/Pain Pain Assessment Pain Assessment: Faces Faces Pain Scale: Hurts even more Pain Location: buttock Pain Descriptors / Indicators: Discomfort, Restless Pain Intervention(s): Monitored during session, Repositioned    Home Living                          Prior Function            PT Goals (current goals can now be found in the care plan section) Acute Rehab PT Goals Patient Stated Goal: unable to state PT Goal Formulation: Patient unable to  participate in goal setting Time For Goal Achievement: 10/28/21 Potential to Achieve Goals: Fair Progress towards PT goals: Progressing toward goals    Frequency    Min 2X/week      PT Plan Current plan remains appropriate    Co-evaluation              AM-PAC PT "6 Clicks" Mobility   Outcome Measure  Help needed turning from your back to your side while in a flat bed without using bedrails?: A Lot Help needed moving from lying on your back to sitting on the side of a flat bed without using bedrails?: Total Help needed moving to and from a bed to a chair (including a wheelchair)?: Total Help needed standing up from a chair using your arms (e.g., wheelchair or bedside chair)?: Total Help needed to walk in hospital room?: Total Help needed climbing 3-5 steps with a railing? : Total 6 Click Score: 7    End of Session Equipment Utilized During Treatment: Gait belt Activity Tolerance: Patient tolerated treatment well;Patient limited by fatigue Patient left: in chair;with family/visitor present;with call bell/phone within reach;with chair alarm set Nurse Communication: Mobility status PT Visit Diagnosis: Muscle weakness (generalized) (M62.81);Other symptoms and signs involving the nervous system [id number])     Time: [id number] PT  Time Calculation (min) (ACUTE ONLY): 51 min  Charges:  $Therapeutic Activity: 38-52 mins                    Chipper Oman, SPT    Ria Comment Janal Haak 10/21/2021, 4:38 PM

## 2021-10-22 DIAGNOSIS — J9611 Chronic respiratory failure with hypoxia: Secondary | ICD-10-CM | POA: Diagnosis not present

## 2021-10-22 DIAGNOSIS — Z930 Tracheostomy status: Secondary | ICD-10-CM | POA: Diagnosis not present

## 2021-10-22 DIAGNOSIS — G9341 Metabolic encephalopathy: Secondary | ICD-10-CM | POA: Diagnosis not present

## 2021-10-22 LAB — CBC WITH DIFFERENTIAL/PLATELET
Abs Immature Granulocytes: 0.04 10*3/uL (ref 0.00–0.07)
Basophils Absolute: 0 10*3/uL (ref 0.0–0.1)
Basophils Relative: 1 %
Eosinophils Absolute: 0.2 10*3/uL (ref 0.0–0.5)
Eosinophils Relative: 3 %
HCT: 26.7 % — ABNORMAL LOW (ref 39.0–52.0)
Hemoglobin: 8.4 g/dL — ABNORMAL LOW (ref 13.0–17.0)
Immature Granulocytes: 1 %
Lymphocytes Relative: 22 %
Lymphs Abs: 1.4 10*3/uL (ref 0.7–4.0)
MCH: 28.9 pg (ref 26.0–34.0)
MCHC: 31.5 g/dL (ref 30.0–36.0)
MCV: 91.8 fL (ref 80.0–100.0)
Monocytes Absolute: 0.7 10*3/uL (ref 0.1–1.0)
Monocytes Relative: 10 %
Neutro Abs: 4.2 10*3/uL (ref 1.7–7.7)
Neutrophils Relative %: 63 %
Platelets: 249 10*3/uL (ref 150–400)
RBC: 2.91 MIL/uL — ABNORMAL LOW (ref 4.22–5.81)
RDW: 15.3 % (ref 11.5–15.5)
WBC: 6.5 10*3/uL (ref 4.0–10.5)
nRBC: 0 % (ref 0.0–0.2)

## 2021-10-22 LAB — GLUCOSE, CAPILLARY
Glucose-Capillary: 110 mg/dL — ABNORMAL HIGH (ref 70–99)
Glucose-Capillary: 177 mg/dL — ABNORMAL HIGH (ref 70–99)
Glucose-Capillary: 192 mg/dL — ABNORMAL HIGH (ref 70–99)
Glucose-Capillary: 202 mg/dL — ABNORMAL HIGH (ref 70–99)
Glucose-Capillary: 213 mg/dL — ABNORMAL HIGH (ref 70–99)
Glucose-Capillary: 214 mg/dL — ABNORMAL HIGH (ref 70–99)
Glucose-Capillary: 248 mg/dL — ABNORMAL HIGH (ref 70–99)

## 2021-10-22 LAB — MAGNESIUM: Magnesium: 2 mg/dL (ref 1.7–2.4)

## 2021-10-22 LAB — PHOSPHORUS: Phosphorus: 3.1 mg/dL (ref 2.5–4.6)

## 2021-10-22 MED ORDER — FOOD THICKENER (SIMPLYTHICK): 10.0000 | ORAL | Status: DC | PRN

## 2021-10-22 MED ORDER — INSULIN GLARGINE-YFGN 100 UNIT/ML ~~LOC~~ SOLN
20.0000 [IU] | Freq: Two times a day (BID) | SUBCUTANEOUS | Status: DC
  Administered 2021-10-22 – 2021-10-24 (×5): 20 [IU] via SUBCUTANEOUS
  Filled 2021-10-22 (×6): qty 0.2

## 2021-10-22 MED ORDER — GLUCERNA SHAKE PO LIQD
237.0000 mL | Freq: Three times a day (TID) | ORAL | Status: DC
  Administered 2021-10-22 – 2021-10-29 (×17): 237 mL via ORAL
  Filled 2021-10-22 (×3): qty 237

## 2021-10-22 NOTE — Progress Notes (Signed)
Nutrition Follow-up  DOCUMENTATION CODES:   Obesity unspecified  INTERVENTION:  - Discontinue tube feeding regimen.   - Add Glucerna Shake po TID, each supplement provides 220 kcal and 10 grams of protein  NUTRITION DIAGNOSIS:   Increased nutrient needs related to acute illness as evidenced by estimated needs.  GOAL:   Patient will meet greater than or equal to 90% of their needs  MONITOR:   PO intake, Supplement acceptance, Diet advancement  REASON FOR ASSESSMENT:   Consult Enteral/tube feeding initiation and management  ASSESSMENT:   Pt with PMH of HTN, HLD, DM, CVA on ASA/plavix admitted 8/31 from [facility] due to GI bleed.  Meds reviewed: colace, sliding scale insulin, miralax, zinc oxide topical. Labs reviewed.   Pt currently with Cortrak tube in place. On room air. Spoke with RN who states that the pt has been doing well with his meals. RD was able to collect calorie count. Pt is meeting his needs with PO intakes at this time. Plan is to stop tube feedings and see how the patient does with PO feeds.   Calorie Count Note  48 hour calorie count ordered.  Diet: Dys 2, Honey thick  Supplements: Magic Cup TID   10/18 Breakfast: 652 kcal; 35 gm protein  10/17 Lunch: 915 kcal; 34 gm protein  10/17 Dinner: 736 kcal; 25 gm protein  Supplements: with meals   Total intake: 2303 kcal (100% of minimum estimated needs)  94 protein (72% of minimum estimated needs)   Diet Order:   Diet Order             DIET - DYS 1 Room service appropriate? No; Fluid consistency: Nectar Thick  Diet effective now                   EDUCATION NEEDS:   No education needs have been identified at this time  Skin:  Skin Assessment: Skin Integrity Issues: Skin Integrity Issues:: Stage II Stage II: medial sacrum  Last BM:  10/21/2021  Height:   Ht Readings from Last 1 Encounters:  10/06/21 6' (1.829 m)    Weight:   Wt Readings from Last 1 Encounters:  10/15/21 125.4 kg     Ideal Body Weight:  80.9 kg  BMI:  Body mass index is 37.49 kg/m.  Estimated Nutritional Needs:   Kcal:  2200-2500  Protein:  130-150 grams  Fluid:  >2 L/day  Thalia Bloodgood, RD, LDN, CNSC

## 2021-10-22 NOTE — Progress Notes (Signed)
PROGRESS NOTE    Troy Randall  [id number] DOB: [birth year] DOA: 09/04/2021 PCP: [name], MD   Brief Narrative:  61 years old male with medical history of hypertension, hyperlipidemia, type 2 diabetes, CVA, presented to the hospital with GI bleed.  Patient underwent IR embolization followed by percutaneous cholecystostomy tube placement due to ongoing altered mental status.  MRI of the brain showed numerous contrast-enhancing bilateral white matter lesions concerning for demyelinating disease.  Neurology was consulted and underwent lumbar puncture on 09/22/2021 with low suspicion for infectious etiology.  Neurology has initiated the patient on high-dose steroids on 09/23/2021.  Patient was then transferred to hospitalist service on 09/24/2021.  Significant events. 8/31 presented to [facility], [name] Admit, GI Consult 9/1 CTA> no evidence of active GI bleed, 2 U PRBC 9/2 2 U PRBC, colonoscopy > no active bleed found but large clots throughout entire colon. EGD> small hiatal hernia, erosive gastropathy with no stigmata of recent bleeding, and non-bleeding duodenal diverticulum.   Underwent then small bowel enteroscopy which noted intermittent blood pumping in the proximal jejunum but was unable to be reached despite multiple attempts, was injected and clip placed. Repeat CTA repeated, which showed positive jejunal diverticular bleeding. IR> active extrav at [facility] territory, jejunal arcade branch, successful coil embo 9/3 Intubated. CTA> no evidence of active GI bleeding.  New finding of abnormal gallbladder with nondependent air, gallbladder wall air, pericholecystic and right upper quadrant information.  IR placed percutaneous cholecystotomy tube.  9/4 R. IJ central line placed with dialysis triple lumen catheter 9/5 CRRT initiated 9/6 Bedside EGD without any evidence of bleeding. CTA a/p without etiology for bleed 9/8 CRRT discontinued. Required ETT exchange yesterday due to blown cuff. 9/11  Bronchoscopic guided percutaneous tracheostomy placement.  Right IJ central line removed. 9/15 flexible sigmoidoscopy with removal of endoscopy 9/16 MRI brain w/o contrast amended study secondary to range of motion.  Rapid development numerous round white matter lesions in both cerebral hemispheres.  Prior MRIs. 9/17 MRI brain: Numerous contrast-enhancing bilateral white matter lesions, consistent with active demyelination. 9/18 Lumbar puncture  9/19 MRI cervical spine, thoracic : Limited exam, within limitations there is not definitive evidence of active demyelinization. Within limitations of motion artifact, there may be demyelinating lesions at the C6 and T1 vertebral body levels. 9/25-clinical exam not improved despite completion of 5-day IV steroids.  Neurology planning to repeat MRI of the brain cervical spine thoracic spine. Possible IR consult for catheter placement for possible Plex therapy if abnormal scans 9/26- scans attempted with ativan 9/29-patient transferred to ICU for respiratory distress due to aspiration pneumonia with sepsis 10/2-patient transferred back to [facility]; started having GI bleed, Dr. Mann/Dr. Benson Norway reconsulted.  Underwent sigmoidoscopy on 10/07/2021. 10/2: Plasma exchange started  Assessment & Plan:   Acute metabolic encephalopathy --MRI brain showed bilateral white matter lesions concerning for active demyelinating disease -S/p IV steroids for 5 days with no significant improvement -Currently undergoing plasma exchange as per neurology, patient received 5 doses, last dose on 10/15/2021.Marland Kitchen  No further recommendations from neurology other than rehabilitation.  Hemorrhagic shock/GI bleed -Patient has had multiple endoscopic procedures last month including EGD/capsule endoscopy/coil embolization of jejunal branch of SMA. -Possibly from stress ulceration/ischemic disease as per GI: Status post sigmoidoscopy on 10/07/2021 which showed single solitary ulcer in the sigmoid colon  which was biopsied and pathology shows chronic colitis. -Has had multiple packed red cell transfusion during this hospitalization. -Monitor H&H.  Hemoglobin has remained stable now between 7 and 8.  Transfuse packed red cells if hemoglobin is less than 7.  Acute respiratory failure with hypoxia status post tracheostomy Aspiration pneumonia -Status post tracheostomy placement on 09/15/2020.  PCCM following weekly. -Patient had acute aspiration event on 10/03/2021 and was placed on broad-spectrum antibiotics: Trach cultures grew MRSA and E. coli.  Treated with Rocephin and vancomycin and completed therapy.  Patient's tracheostomy is now capped.  He is on room air and saturating 96%.  PCCM to attempt decannulation tomorrow.  E. coli cholecystitis status post percutaneous cholecystostomy drain placement -Had percutaneous cholecystostomy drain placement on 09/07/2021.  Subsequently had completed course of antibiotics -Will eventually need outpatient surgery/IR follow-up  Transaminitis -Resolved.  Thrombocytopenia -Resolved.  Diabetes mellitus type 2 with hyperglycemia: Elevated.  Will increase Semglee from 15 units twice daily to 20 units twice daily and continue SSI.  Acute kidney injury: Resolved -CRRT discontinued on 09/12/2021  Hypokalemia--resolved  Hypophosphatemia -Improved  Hypertension -Continue monitoring blood pressure.  Currently on doxazosin   Obesity -Outpatient follow-up  Stage II medial sacral ulcer: Not present on admission -Continue local wound care.  Acute urinary retention -Foley catheter removed on 10/13/2021.  Subsequently, he has had issues with retention.  We will try straight in and out cath: If still continues to have issues with retention, will have to place Foley catheter again. -Continue doxazosin  Moderate protein calorie malnutrition/poor p.o. intake/dysphagia: IR was consulted for possible PEG placement however wife wanted to wait.  Patient on tube feed  currently.  Patient core track was clogged day before yesterday, core track team has replaced tube yesterday.  Feedings resumed however calorie count was completed and patient is eating almost 75 to 100% of the food so speech is going to discontinue tube feedings and monitor.   DVT prophylaxis: SCDs, we were avoiding heparin products, due to hemorrhagic shock, hemoglobin is now improving so we resumed him on DVT prophylaxis dose of Lovenox on 10/21/2021.  Monitor closely. Code Status: Full Family Communication: Wife at bedside  Disposition Plan: Status is: Inpatient Remains inpatient appropriate because: Medically stable.  LTAC declined 1 more time.  TOC to start working on SNF now.  Consultants: GI/neurology/PCCM/general surgery/nephrology/IR  Procedures: As above  Antimicrobials:  Anti-infectives (From admission, onward)    Start     Dose/Rate Route Frequency Ordered Stop   10/06/21 1515  cefTRIAXone (ROCEPHIN) 2 g in sodium chloride 0.9 % 100 mL IVPB        2 g 200 mL/hr over 30 Minutes Intravenous Every 24 hours 10/06/21 1420 10/09/21 1756   10/04/21 0945  metroNIDAZOLE (FLAGYL) IVPB 500 mg  Status:  Discontinued        500 mg 100 mL/hr over 60 Minutes Intravenous Every 12 hours 10/04/21 0853 10/06/21 1329   10/03/21 2200  vancomycin (VANCOREADY) IVPB 1500 mg/300 mL        1,500 mg 150 mL/hr over 120 Minutes Intravenous Every 12 hours 10/03/21 0613 10/10/21 0025   10/03/21 0700  vancomycin (VANCOREADY) IVPB 2000 mg/400 mL        2,000 mg 200 mL/hr over 120 Minutes Intravenous  Once 10/03/21 0613 10/03/21 1149   10/03/21 0700  ceFEPIme (MAXIPIME) 2 g in sodium chloride 0.9 % 100 mL IVPB  Status:  Discontinued        2 g 200 mL/hr over 30 Minutes Intravenous Every 8 hours 10/03/21 0613 10/06/21 1329   10/03/21 0645  vancomycin (VANCOREADY) IVPB 2000 mg/400 mL  Status:  Discontinued        2,000  mg 200 mL/hr over 120 Minutes Intravenous  Once 10/03/21 0549 10/03/21 0748   10/03/21  0645  ceFEPIme (MAXIPIME) 2 g in sodium chloride 0.9 % 100 mL IVPB  Status:  Discontinued        2 g 200 mL/hr over 30 Minutes Intravenous  Once 10/03/21 0549 10/03/21 1121   10/03/21 0600  vancomycin (VANCOREADY) IVPB 1500 mg/300 mL  Status:  Discontinued        1,500 mg 150 mL/hr over 120 Minutes Intravenous On call 10/02/21 1334 10/03/21 0549   09/11/21 1100  cefTRIAXone (ROCEPHIN) 2 g in sodium chloride 0.9 % 100 mL IVPB        2 g 200 mL/hr over 30 Minutes Intravenous Every 24 hours 09/11/21 1010 09/13/21 1108   09/08/21 0200  piperacillin-tazobactam (ZOSYN) IVPB 3.375 g  Status:  Discontinued        3.375 g 12.5 mL/hr over 240 Minutes Intravenous Every 8 hours 09/07/21 1856 09/07/21 2114   09/07/21 2200  meropenem (MERREM) 1 g in sodium chloride 0.9 % 100 mL IVPB  Status:  Discontinued        1 g 200 mL/hr over 30 Minutes Intravenous Every 8 hours 09/07/21 2114 09/11/21 1010   09/07/21 1945  piperacillin-tazobactam (ZOSYN) IVPB 3.375 g        3.375 g 100 mL/hr over 30 Minutes Intravenous  Once 09/07/21 1856 09/07/21 2005   09/07/21 1753  cefTRIAXone (ROCEPHIN) injection         Intravenous As needed 09/07/21 1754 09/07/21 1753   09/07/21 1749  sodium chloride 0.9 % with cefTRIAXone (ROCEPHIN) ADS Med       Note to Pharmacy: Eveline Keto E: cabinet override      09/07/21 1749 09/07/21 1935        Subjective:  Patient seen and examined.  Patient intermittently talks.  Improving.  Has no complaints.   Objective: Vitals:   10/22/21 0448 10/22/21 0758 10/22/21 0846 10/22/21 1205  BP: 130/69 134/79    Pulse: (!) 106 100 100 97  Resp: 19 (!) 21 17 (!) 27  Temp: 98.3 F (36.8 C) 98.2 F (36.8 C)    TempSrc: Axillary Axillary    SpO2: 98% 96% 96% 97%  Weight:      Height:        Intake/Output Summary (Last 24 hours) at 10/22/2021 1346 Last data filed at 10/22/2021 0509 Gross per 24 hour  Intake --  Output 1550 ml  Net -1550 ml    Filed Weights   10/10/21 1048  10/15/21 1344 10/15/21 1625  Weight: 128.9 kg 125.4 kg 125.4 kg    Examination:  General exam: Appears calm and comfortable  Respiratory system: Clear to auscultation. Respiratory effort normal.  Tracheostomy. Cardiovascular system: S1 & S2 heard, RRR. No JVD, murmurs, rubs, gallops or clicks. No pedal edema. Gastrointestinal system: Abdomen is nondistended, soft and nontender. No organomegaly or masses felt. Normal bowel sounds heard. Central nervous system: Alert and oriented.  Slight weakness in right upper extremity, 3/5 power in left upper extremity.   Data Reviewed: I have personally reviewed following labs and imaging studies  CBC: Recent Labs  Lab 10/18/21 0309 10/19/21 0251 10/20/21 0409 10/21/21 0416 10/22/21 0437  WBC 3.5* 5.0 4.9 5.4 6.5  NEUTROABS 1.8 3.2 2.6 3.1 4.2  HGB 8.1* 8.6* 8.8* 8.7* 8.4*  HCT 25.9* 28.2* 29.1* 27.8* 26.7*  MCV 92.8 93.4 93.6 93.3 91.8  PLT 248 279 275 265 161    Basic Metabolic  Panel: Recent Labs  Lab 10/18/21 0309 10/19/21 0251 10/20/21 0409 10/21/21 0416 10/22/21 0437  NA  --   --  141  --   --   K  --   --  3.6  --   --   CL  --   --  104  --   --   CO2  --   --  28  --   --   GLUCOSE  --   --  73  --   --   BUN  --   --  12  --   --   CREATININE  --   --  0.64  --   --   CALCIUM  --   --  9.6  --   --   MG 2.1 2.1 2.2 2.0 2.0  PHOS 4.2 5.1* 5.3* 3.7 3.1    GFR: Estimated Creatinine Clearance: 136 mL/min (by C-G formula based on SCr of 0.64 mg/dL). Liver Function Tests: No results for input(s): "AST", "ALT", "ALKPHOS", "BILITOT", "PROT", "ALBUMIN" in the last 168 hours.  No results for input(s): "LIPASE", "AMYLASE" in the last 168 hours.  No results for input(s): "AMMONIA" in the last 168 hours. Coagulation Profile: No results for input(s): "INR", "PROTIME" in the last 168 hours.  Cardiac Enzymes: No results for input(s): "CKTOTAL", "CKMB", "CKMBINDEX", "TROPONINI" in the last 168 hours. BNP (last 3 results) No  results for input(s): "PROBNP" in the last 8760 hours. HbA1C: No results for input(s): "HGBA1C" in the last 72 hours. CBG: Recent Labs  Lab 10/21/21 2029 10/22/21 0014 10/22/21 0437 10/22/21 0757 10/22/21 1029  GLUCAP 272* 248* 192* 202* 214*    Lipid Profile: No results for input(s): "CHOL", "HDL", "LDLCALC", "TRIG", "CHOLHDL", "LDLDIRECT" in the last 72 hours. Thyroid Function Tests: No results for input(s): "TSH", "T4TOTAL", "FREET4", "T3FREE", "THYROIDAB" in the last 72 hours. Anemia Panel: No results for input(s): "VITAMINB12", "FOLATE", "FERRITIN", "TIBC", "IRON", "RETICCTPCT" in the last 72 hours. Sepsis Labs: No results for input(s): "PROCALCITON", "LATICACIDVEN" in the last 168 hours.   No results found for this or any previous visit (from the past 240 hour(s)).        Radiology Studies: No results found.      Scheduled Meds:  Chlorhexidine Gluconate Cloth  6 each Topical Daily   docusate sodium  100 mg Oral BID   doxazosin  2 mg Per Tube Daily   enoxaparin (LOVENOX) injection  40 mg Subcutaneous Q24H   feeding supplement (GLUCERNA SHAKE)  237 mL Oral TID BM   free water  250 mL Per Tube Q6H   influenza vac split quadrivalent PF  0.5 mL Intramuscular Tomorrow-1000   insulin aspart  0-15 Units Subcutaneous Q4H   insulin glargine-yfgn  20 Units Subcutaneous BID   mouth rinse  15 mL Mouth Rinse 4 times per day   pantoprazole  40 mg Per Tube BID   pneumococcal 23 valent vaccine  0.5 mL Intramuscular Tomorrow-1000   polyethylene glycol  17 g Per Tube Daily   rosuvastatin  40 mg Per Tube Daily   sodium chloride flush  10-40 mL Intracatheter Q12H   sodium chloride flush  5 mL Intracatheter Q8H   tamsulosin  0.4 mg Oral QPC breakfast   Zinc Oxide   Topical BID   Continuous Infusions:  sodium chloride 10 mL/hr at 10/18/21 1524   sodium chloride 250 mL (10/11/21 [id number])   sodium chloride Stopped (10/07/21 1454)   citrate dextrose  dextrose Stopped  (10/04/21 1008)   lactated ringers      Darliss Cheney, MD [facility] 10/22/2021, 1:46 PM

## 2021-10-22 NOTE — Care Management Important Message (Signed)
Important Message  Patient Details  Name: Troy Randall MRN: [id number] Date of Birth: Apr 06, 1960   Medicare Important Message Given:  Yes     Liliana Brentlinger 10/22/2021, 11:18 AM

## 2021-10-22 NOTE — Progress Notes (Signed)
   NAME:  Troy Randall, MRN:  [id number], DOB:  January 26, 1960, LOS: 48  Interval/subjective: Red cap placed on 10/17 No overnight issues   Vitals:   10/22/21 0846 10/22/21 1205 10/22/21 1405 10/22/21 1520  BP:   121/71   Pulse: 100 97 96 87  Resp: 17 (!) 27 (!) 22 18  Temp:   98 F (36.7 C)   TempSrc:   Oral   SpO2: 96% 97%  96%  Weight:      Height:       Physical exam: General: Chronically ill-appearing male, lying on the bed, s/p trach, [place] in place. Neuro: Alert, awake, intermittently following commands Chest: Coarse breath sounds, no wheezes or rhonchi Heart: Regular rate and rhythm, no murmurs or gallops Abdomen: Soft, nontender, nondistended, bowel sounds present Skin: No rash    Impression: Poorly characterized demyelinating disease with neurological compromise and persistent respiratory insufficiency.  Required tracheostomy but now overall improved.  Comfortable respiratory pattern.  Able to phonate.   No evidence of secretion issues  Plans: Would leave red cap in place and assess for possible decannulation trial on 10/19.    Jacky Kindle, MD [facility] See Amion for pager If no response to pager, please call [phone] until 7pm After 7pm, Please call E-link [phone]

## 2021-10-22 NOTE — TOC Progression Note (Signed)
Transition of Care [facility]) - Initial/Assessment Note    Patient Details  Name: Troy Randall MRN: [id number] Date of Birth: [birth year]  [facility] CM/SW Contact:    Milinda Antis, [place] Phone Number: 10/22/2021, 4:22 PM  Clinical Narrative:                 TOC following patient for d/c planning needs once medically stable.  LTACH denied.  Janyce Llanos is a barrier to SNF placement.  TOC will be complete SNF workup when patient is closer to being medically ready.  Lind Covert, MSW, LCSW  Expected Discharge Plan: Long Term Acute Care (LTAC) Barriers to Discharge: Continued Medical Work up   Patient Goals and CMS Choice Patient states their goals for this hospitalization and ongoing recovery are:: To return home      Expected Discharge Plan and Services Expected Discharge Plan: [place] (LTAC)   Discharge Planning Services: CM Consult   Living arrangements for the past 2 months: Single Family Home                                      Prior Living Arrangements/Services Living arrangements for the past 2 months: Single Family Home Lives with:: Spouse Patient language and need for interpreter reviewed:: Yes Do you feel safe going back to the place where you live?: Yes      Need for Family Participation in Patient Care: Yes (Comment) Care giver support system in place?: Yes (comment) Current home services: DME (All necessary DME's) Criminal Activity/Legal Involvement Pertinent to Current Situation/Hospitalization: No - Comment as needed  Activities of Daily Living Home Assistive Devices/Equipment: CBG Meter ADL Screening (condition at time of admission) Patient's cognitive ability adequate to safely complete daily activities?: Yes Is the patient deaf or have difficulty hearing?: No Does the patient have difficulty seeing, even when wearing glasses/contacts?: No Does the patient have difficulty concentrating, remembering, or making  decisions?: No Patient able to express need for assistance with ADLs?: Yes Does the patient have difficulty dressing or bathing?: No Independently performs ADLs?: Yes (appropriate for developmental age) Does the patient have difficulty walking or climbing stairs?: No Weakness of Legs: None Weakness of Arms/Hands: None  Permission Sought/Granted Permission sought to share information with : Case Manager, Family Supports Permission granted to share information with : Yes, Verbal Permission Granted              Emotional Assessment Appearance:: Appears stated age Attitude/Demeanor/Rapport: Unable to Assess (Intubated) Affect (typically observed): Unable to Assess (Intubated) Orientation: : Oriented to Self Alcohol / Substance Use: Not Applicable Psych Involvement: No (comment)  Admission diagnosis:  GI bleed [K92.2] Lower GI bleed [K92.2] Patient Active Problem List   Diagnosis Date Noted   Pressure injury of skin 10/07/2021   Anemia due to blood loss 09/18/2021   History of lower GI bleeding: 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  09/18/2021   H/O ischemic bowel disease 09/18/2021   Acute emphysematous cholecystitis 09/18/2021   Chronic respiratory failure with hypoxia (HCC)    Tracheostomy status ([place])    Acute metabolic encephalopathy    Acute CVA (cerebrovascular accident) ([name]) 04/28/2021   Cannot sleep 08/26/2015   Chronic right-sided low back pain with sciatica 07/10/2015   Uncontrolled type 2 diabetes mellitus with hyperglycemia, without long-term current use of insulin ([place]) 06/19/2015   Hyperlipidemia 11/29/2013  Essential hypertension 11/28/2013   PCP:  [name], MD Pharmacy:   [place], [place] - [address] [place] [address] [place] [address] [place] [address] Phone: [phone] Fax: [phone]     Social Determinants of Health (SDOH) Interventions    Readmission Risk Interventions     No data to display

## 2021-10-22 NOTE — Progress Notes (Signed)
Speech Language Pathology Treatment: Dysphagia  Patient Details Name: DARRILL VREELAND MRN: [id number] DOB: 09/04/1960 Today's Date: 10/22/2021 Time: 7622-6333 SLP Time Calculation (min) (ACUTE ONLY): 15 min  Assessment / Plan / Recommendation Clinical Impression  Patient seen by SLP for skilled treatment focused on dysphagia goals. Spouse present in room and she reports his PO intake has been very good but can take some time (up to 45 minutes). After repositioning in bed, SLP assessed patient's toleration of nectar thick liquids. His wife fed this by spoon and at this time did not seem comfortable trying cup sips. Patient exhibited mild oral and suspect pharyngeal phase initiation delays but no overt s/s aspiration or penetration. Patient's trach has been capped as of 10/17 and he continues to have more frequent verbalizations. He continues with moderate amount of pain in his buttocks which is also impacting his ability to focus. SLP will continue to follow for cognitive-linguistic and swallow function.    HPI HPI: Pt is a 61 y.o. male with history of stroke in 4/23, HTN, HLD and DM with recent severe jejunal bleed s/p vessel coiling who is being seen for altered mental status and concerning white matter changes on MRI. Patient's wife reports that after his stroke in 4/23 he made a good recovery and was able to perform ADLs. He had some residual dysarthria. Pt presented to [facility] on 8/[facility] with GI bleeding and has remained persistently altered. Intubated 9/3 and trach placed 9/11. He currently has #6 cuffed trach tolerating cuff deflated.      SLP Plan  Continue with current plan of care      Recommendations for follow up therapy are one component of a multi-disciplinary discharge planning process, led by the attending physician.  Recommendations may be updated based on patient status, additional functional criteria and insurance authorization.    Recommendations  Diet  recommendations: Dysphagia 1 (puree);Nectar-thick liquid Liquids provided via: Teaspoon;Cup Medication Administration: Crushed with puree Supervision: Full supervision/cueing for compensatory strategies;Trained caregiver to feed patient;Staff to assist with self feeding Compensations: Slow rate;Small sips/bites;Lingual sweep for clearance of pocketing;Monitor for anterior loss;Minimize environmental distractions Postural Changes and/or Swallow Maneuvers: Seated upright 90 degrees                Oral Care Recommendations: Oral care BID;Staff/trained caregiver to provide oral care Follow Up Recommendations: Skilled nursing-short term rehab (<3 hours/day) Assistance recommended at discharge: Frequent or constant Supervision/Assistance SLP Visit Diagnosis: Dysphagia, oropharyngeal phase (R13.12) Plan: Continue with current plan of care         Sonia Baller, MA, CCC-SLP Speech Therapy

## 2021-10-23 ENCOUNTER — Encounter (HOSPITAL_COMMUNITY): Payer: Self-pay | Admitting: Internal Medicine

## 2021-10-23 DIAGNOSIS — Z930 Tracheostomy status: Secondary | ICD-10-CM | POA: Diagnosis not present

## 2021-10-23 DIAGNOSIS — R0689 Other abnormalities of breathing: Secondary | ICD-10-CM | POA: Diagnosis not present

## 2021-10-23 DIAGNOSIS — G9341 Metabolic encephalopathy: Secondary | ICD-10-CM | POA: Diagnosis not present

## 2021-10-23 LAB — CBC WITH DIFFERENTIAL/PLATELET
Abs Immature Granulocytes: 0.04 10*3/uL (ref 0.00–0.07)
Basophils Absolute: 0 10*3/uL (ref 0.0–0.1)
Basophils Relative: 1 %
Eosinophils Absolute: 0.2 10*3/uL (ref 0.0–0.5)
Eosinophils Relative: 3 %
HCT: 28.5 % — ABNORMAL LOW (ref 39.0–52.0)
Hemoglobin: 8.7 g/dL — ABNORMAL LOW (ref 13.0–17.0)
Immature Granulocytes: 1 %
Lymphocytes Relative: 25 %
Lymphs Abs: 1.4 10*3/uL (ref 0.7–4.0)
MCH: 28.1 pg (ref 26.0–34.0)
MCHC: 30.5 g/dL (ref 30.0–36.0)
MCV: 91.9 fL (ref 80.0–100.0)
Monocytes Absolute: 0.6 10*3/uL (ref 0.1–1.0)
Monocytes Relative: 10 %
Neutro Abs: 3.3 10*3/uL (ref 1.7–7.7)
Neutrophils Relative %: 60 %
Platelets: 259 10*3/uL (ref 150–400)
RBC: 3.1 MIL/uL — ABNORMAL LOW (ref 4.22–5.81)
RDW: 15.3 % (ref 11.5–15.5)
WBC: 5.5 10*3/uL (ref 4.0–10.5)
nRBC: 0 % (ref 0.0–0.2)

## 2021-10-23 LAB — GLUCOSE, CAPILLARY
Glucose-Capillary: 118 mg/dL — ABNORMAL HIGH (ref 70–99)
Glucose-Capillary: 119 mg/dL — ABNORMAL HIGH (ref 70–99)
Glucose-Capillary: 147 mg/dL — ABNORMAL HIGH (ref 70–99)
Glucose-Capillary: 140 mg/dL — ABNORMAL HIGH (ref 70–99)

## 2021-10-23 LAB — MAGNESIUM: Magnesium: 2.1 mg/dL (ref 1.7–2.4)

## 2021-10-23 LAB — PHOSPHORUS: Phosphorus: 4.1 mg/dL (ref 2.5–4.6)

## 2021-10-23 MED ORDER — POLYETHYLENE GLYCOL 3350 17 G PO PACK
17.0000 g | PACK | Freq: Every day | ORAL | Status: DC
  Administered 2021-10-24 – 2021-10-28 (×5): 17 g via ORAL
  Filled 2021-10-23 (×7): qty 1

## 2021-10-23 MED ORDER — DOXAZOSIN MESYLATE 2 MG PO TABS
2.0000 mg | ORAL_TABLET | Freq: Every day | ORAL | Status: DC
  Administered 2021-10-24 – 2021-10-30 (×7): 2 mg via ORAL
  Filled 2021-10-23 (×7): qty 1

## 2021-10-23 MED ORDER — PANTOPRAZOLE SODIUM 40 MG PO TBEC
40.0000 mg | DELAYED_RELEASE_TABLET | Freq: Two times a day (BID) | ORAL | Status: DC
  Administered 2021-10-23 – 2021-10-25 (×6): 40 mg via ORAL
  Filled 2021-10-23 (×7): qty 1

## 2021-10-23 MED ORDER — ROSUVASTATIN CALCIUM 20 MG PO TABS
40.0000 mg | ORAL_TABLET | Freq: Every day | ORAL | Status: DC
  Administered 2021-10-24 – 2021-10-30 (×7): 40 mg via ORAL
  Filled 2021-10-23 (×7): qty 2

## 2021-10-23 MED ORDER — ALTEPLASE 2 MG IJ SOLR
2.0000 mg | Freq: Once | INTRAMUSCULAR | Status: AC
  Administered 2021-10-23: 2 mg
  Filled 2021-10-23: qty 2

## 2021-10-23 NOTE — Progress Notes (Signed)
Occupational Therapy Treatment Patient Details Name: Troy Randall MRN: [id number] DOB: 09-Jul-1960 Today's Date: 10/23/2021   History of present illness Patient is a 61 y/o male who presents on 09/04/21 with dark stools, dizziness and SOB. Found to have GI bleed and acute anemia s/p embolization 09/06/21 and percutaneous cholecystostomy 09/07/21. Intubated 9/3, CRRT 9/5-9/8. Hemorrhagic and septic shock 9/5. Trach placed 9/11. 9/16 MRI rapid development numerous round white matter lesions in both cerebral hemispheres; 9/17 MRI: numerous white matter lesions consistent with active demyelination - concerning for MS, ADEM, and cerebral vasculitis; 9/18 Lumbar puncture. Pt returned to ICU on 9/29 with aspiration PNA, tachypnea, tachycardia and increased WOB. 10/2 - pt transferred back to [facility]; started having GIB. Underwent sigmoidoscopy on 10/07/2021. Plasma exchange started. 10/17 trach capped.10/19 decanulated PMH includes CVA, DM, HTN.   OT comments  Pt continues to demonstrate improved awareness, ability to follow simple commands and UB strength. He can bring his hands to mouth/head as a precursor to self feeding and grooming. Attention continues to be focused. Moderate assistance to roll. Pt assists in pulling himself up for positioning when feeding using foot board vs head board of bed. Today's session focused on bed level exercises and bed mobility. Updated d/c recommendation to AIR.   Recommendations for follow up therapy are one component of a multi-disciplinary discharge planning process, led by the attending physician.  Recommendations may be updated based on patient status, additional functional criteria and insurance authorization.    Follow Up Recommendations  Acute inpatient rehab (3hours/day)    Assistance Recommended at Discharge Frequent or constant Supervision/Assistance  Patient can return home with the following  Two people to help with walking and/or transfers;A lot of help with  bathing/dressing/bathroom;Assistance with feeding;Assistance with cooking/housework;Direct supervision/assist for medications management;Direct supervision/assist for financial management;Assist for transportation;Help with stairs or ramp for entrance   Equipment Recommendations  Wheelchair (measurements OT);Wheelchair cushion (measurements OT);Hospital bed;BSC/3in1;Other (comment) (hoyer lift)    Recommendations for Other Services      Precautions / Restrictions Precautions Precautions: Fall Precaution Comments: decanulated, cortrack, R drain Restrictions Weight Bearing Restrictions: No Other Position/Activity Restrictions: pt unable to sit on bottom for extended periods of time due to pain (even on geomat)       Mobility Bed Mobility Overal bed mobility: Needs Assistance Bed Mobility: Rolling Rolling: Mod assist         General bed mobility comments: cues to flex opposite knee and reach for rail, placed pillows at back to relieve back pain    Transfers                         Balance                                           ADL either performed or assessed with clinical judgement   ADL   Eating/Feeding: Total assistance;Bed level Eating/Feeding Details (indicate cue type and reason): can bring finger foods and cup to mouth                                        Extremity/Trunk Assessment              Vision       Perception     Praxis  Cognition Arousal/Alertness: Awake/alert Behavior During Therapy: Flat affect Overall Cognitive Status: Impaired/Different from baseline Area of Impairment: Following commands, Attention, Problem solving, Safety/judgement                   Current Attention Level: Focused   Following Commands: Follows one step commands with increased time, Follows one step commands inconsistently Safety/Judgement: Decreased awareness of deficits, Decreased awareness of safety    Problem Solving: Slow processing, Decreased initiation, Difficulty sequencing, Requires verbal cues, Requires tactile cues General Comments: pt answering yes/no questions reliably, making statements and choices verbally        Exercises Exercises: General Upper Extremity General Exercises - Upper Extremity Shoulder Flexion: AAROM, Both, 10 reps, Supine Elbow Flexion: Both, 10 reps, Supine, AROM Elbow Extension: AAROM, Both, 10 reps, Supine Digit Composite Flexion: Strengthening, Both, Supine General Exercises - Lower Extremity Heel Slides: Supine, Strengthening, 10 reps, Both    Shoulder Instructions       General Comments      Pertinent Vitals/ Pain       Pain Assessment Pain Assessment: Faces Faces Pain Scale: Hurts little more Pain Location: back Pain Descriptors / Indicators: Discomfort, Restless Pain Intervention(s): Monitored during session, Repositioned  Home Living                                          Prior Functioning/Environment              Frequency  Min 2X/week        Progress Toward Goals  OT Goals(current goals can now be found in the care plan section)  Progress towards OT goals: Progressing toward goals  Acute Rehab OT Goals OT Goal Formulation: With family Time For Goal Achievement: 10/30/21 Potential to Achieve Goals: Fair  Plan Discharge plan needs to be updated    Co-evaluation                 AM-PAC OT "6 Clicks" Daily Activity     Outcome Measure   Help from another person eating meals?: A Lot Help from another person taking care of personal grooming?: Total Help from another person toileting, which includes using toliet, bedpan, or urinal?: Total Help from another person bathing (including washing, rinsing, drying)?: Total Help from another person to put on and taking off regular upper body clothing?: Total Help from another person to put on and taking off regular lower body clothing?: Total 6  Click Score: 7    End of Session    OT Visit Diagnosis: Muscle weakness (generalized) (M62.81);Other symptoms and signs involving cognitive function;Pain;Unsteadiness on feet (R26.81)   Activity Tolerance Patient tolerated treatment well   Patient Left in bed;with call bell/phone within reach;with family/visitor present   Nurse Communication          Time: 1455-1536 OT Time Calculation (min): 41 min  Charges: OT General Charges $OT Visit: 1 Visit OT Treatments $Therapeutic Exercise: 23-37 mins  Berna Spare, OTR/L Acute Rehabilitation Services Office: [phone]   Evern Bio 10/23/2021, 3:56 PM

## 2021-10-23 NOTE — Plan of Care (Signed)
  Problem: Education: Goal: Knowledge of General Education information will improve Description: Including pain rating scale, medication(s)/side effects and non-pharmacologic comfort measures Outcome: Progressing   Problem: Health Behavior/Discharge Planning: Goal: Ability to manage health-related needs will improve Outcome: Progressing   Problem: Clinical Measurements: Goal: Ability to maintain clinical measurements within normal limits will improve Outcome: Progressing Goal: Will remain free from infection Outcome: Progressing Goal: Diagnostic test results will improve Outcome: Progressing Goal: Respiratory complications will improve Outcome: Progressing Goal: Cardiovascular complication will be avoided Outcome: Progressing   Problem: Activity: Goal: Risk for activity intolerance will decrease Outcome: Progressing   Problem: Nutrition: Goal: Adequate nutrition will be maintained Outcome: Progressing   Problem: Coping: Goal: Level of anxiety will decrease Outcome: Progressing   Problem: Elimination: Goal: Will not experience complications related to bowel motility Outcome: Progressing Goal: Will not experience complications related to urinary retention Outcome: Progressing   Problem: Pain Managment: Goal: General experience of comfort will improve Outcome: Progressing   Problem: Safety: Goal: Ability to remain free from injury will improve Outcome: Progressing   Problem: Skin Integrity: Goal: Risk for impaired skin integrity will decrease Outcome: Progressing   Problem: Education: Goal: Ability to describe self-care measures that may prevent or decrease complications (Diabetes Survival Skills Education) will improve Outcome: Progressing Goal: Individualized Educational Video(s) Outcome: Progressing   Problem: Coping: Goal: Ability to adjust to condition or change in health will improve Outcome: Progressing   Problem: Fluid Volume: Goal: Ability to  maintain a balanced intake and output will improve Outcome: Progressing   Problem: Health Behavior/Discharge Planning: Goal: Ability to identify and utilize available resources and services will improve Outcome: Progressing Goal: Ability to manage health-related needs will improve Outcome: Progressing   Problem: Metabolic: Goal: Ability to maintain appropriate glucose levels will improve Outcome: Progressing   Problem: Nutritional: Goal: Maintenance of adequate nutrition will improve Outcome: Progressing Goal: Progress toward achieving an optimal weight will improve Outcome: Progressing   Problem: Skin Integrity: Goal: Risk for impaired skin integrity will decrease Outcome: Progressing   Problem: Tissue Perfusion: Goal: Adequacy of tissue perfusion will improve Outcome: Progressing   Problem: Education: Goal: Understanding of CV disease, CV risk reduction, and recovery process will improve Outcome: Progressing Goal: Individualized Educational Video(s) Outcome: Progressing   Problem: Activity: Goal: Ability to return to baseline activity level will improve Outcome: Progressing   Problem: Cardiovascular: Goal: Ability to achieve and maintain adequate cardiovascular perfusion will improve Outcome: Progressing Goal: Vascular access site(s) Level 0-1 will be maintained Outcome: Progressing   Problem: Health Behavior/Discharge Planning: Goal: Ability to safely manage health-related needs after discharge will improve Outcome: Progressing   Problem: Education: Goal: Knowledge about tracheostomy care/management will improve Outcome: Progressing   Problem: Activity: Goal: Ability to tolerate increased activity will improve Outcome: Progressing   Problem: Health Behavior/Discharge Planning: Goal: Ability to manage tracheostomy will improve Outcome: Progressing   Problem: Respiratory: Goal: Patent airway maintenance will improve Outcome: Progressing   Problem: Role  Relationship: Goal: Ability to communicate will improve Outcome: Progressing

## 2021-10-23 NOTE — Progress Notes (Addendum)
Progress Note    Troy Randall  [id number] DOB: 10/09/1960  DOA: 09/04/2021 PCP: [name], MD      Brief Narrative:    Medical records reviewed and are as summarized below:  Troy Randall is a 61 y.o. male with medical history of hypertension, hyperlipidemia, type 2 diabetes, CVA, presented to the hospital with GI bleed.  Patient underwent IR embolization followed by percutaneous cholecystostomy tube placement due to ongoing altered mental status.  MRI of the brain showed numerous contrast-enhancing bilateral white matter lesions concerning for demyelinating disease.  Neurology was consulted and underwent lumbar puncture on 09/22/2021 with low suspicion for infectious etiology.  Neurology has initiated the patient on high-dose steroids on 09/23/2021.  Patient was then transferred to hospitalist service on 09/24/2021.     Significant events. 8/31 presented to [facility], [name] Admit, GI Consult 9/1 CTA> no evidence of active GI bleed, 2 U PRBC 9/2 2 U PRBC, colonoscopy > no active bleed found but large clots throughout entire colon. EGD> small hiatal hernia, erosive gastropathy with no stigmata of recent bleeding, and non-bleeding duodenal diverticulum.   Underwent then small bowel enteroscopy which noted intermittent blood pumping in the proximal jejunum but was unable to be reached despite multiple attempts, was injected and clip placed. Repeat CTA repeated, which showed positive jejunal diverticular bleeding. IR> active extrav at [facility] territory, jejunal arcade branch, successful coil embo 9/3 Intubated. CTA> no evidence of active GI bleeding.  New finding of abnormal gallbladder with nondependent air, gallbladder wall air, pericholecystic and right upper quadrant information.  IR placed percutaneous cholecystotomy tube.  9/4 R. IJ central line placed with dialysis triple lumen catheter 9/5 CRRT initiated 9/6 Bedside EGD without any evidence of bleeding. CTA a/p without etiology for  bleed 9/8 CRRT discontinued. Required ETT exchange yesterday due to blown cuff. 9/11 Bronchoscopic guided percutaneous tracheostomy placement.  Right IJ central line removed. 9/15 flexible sigmoidoscopy with removal of endoscopy 9/16 MRI brain w/o contrast amended study secondary to range of motion.  Rapid development numerous round white matter lesions in both cerebral hemispheres.  Prior MRIs. 9/17 MRI brain: Numerous contrast-enhancing bilateral white matter lesions, consistent with active demyelination. 9/18 Lumbar puncture  9/19 MRI cervical spine, thoracic : Limited exam, within limitations there is not definitive evidence of active demyelinization. Within limitations of motion artifact, there may be demyelinating lesions at the C6 and T1 vertebral body levels. 9/25-clinical exam not improved despite completion of 5-day IV steroids.  Neurology planning to repeat MRI of the brain cervical spine thoracic spine. Possible IR consult for catheter placement for possible Plex therapy if abnormal scans 9/26- scans attempted with ativan 9/29-patient transferred to ICU for respiratory distress due to aspiration pneumonia with sepsis 10/2-patient transferred back to [facility]; started having GI bleed, Dr. Mann/Dr. Benson Norway reconsulted.  Underwent sigmoidoscopy on 10/07/2021. 10/2: Plasma exchange starte       Assessment/Plan:   Principal Problem:   Acute metabolic encephalopathy Active Problems:   Uncontrolled type 2 diabetes mellitus with hyperglycemia, without long-term current use of insulin (HCC)   Essential hypertension   Hyperlipidemia   Chronic respiratory failure with hypoxia (HCC)   Tracheostomy status (HCC)   Anemia due to blood loss   History of lower GI bleeding: 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    H/O ischemic bowel disease   Acute emphysematous cholecystitis   Pressure injury of skin   Nutrition Problem: Increased nutrient needs Etiology: acute  illness  Signs/Symptoms: estimated needs   Body mass index is 37.49 kg/m.  (Obesity)   Acute metabolic encephalopathy -MRI brain showed bilateral white matter lesions concerning for active demyelinating disease -S/p IV steroids for 5 days with no significant improvement He underwent plasma exchange.  He had 5 doses and last dose was on 10/15/2021.  Last dose on 10/15/2021.Troy Randall  No further recommendations from neurology other than rehabilitation.   Hemorrhagic shock/GI bleed -Patient has had multiple endoscopic procedures last month including EGD/capsule endoscopy/coil embolization of jejunal branch of SMA. -Possibly from stress ulceration/ischemic disease as per GI: Status post sigmoidoscopy on 10/07/2021 which showed single solitary ulcer in the sigmoid colon which was biopsied and pathology shows chronic colitis. -Has had multiple packed red cell transfusion during this hospitalization. -Monitor H&H.  Hemoglobin has remained stable now between 7 and 8.  Transfuse packed red cells if hemoglobin is less than 7.    Acute respiratory failure with hypoxia status post tracheostomy Aspiration pneumonia -Status post tracheostomy placement on 09/15/2020.  PCCM following weekly. -Patient had acute aspiration event on 10/03/2021 and was placed on broad-spectrum antibiotics: Trach cultures grew MRSA and E. coli.  Treated with Rocephin and vancomycin and completed therapy.   Tracheostomy was successfully decannulated 10/23/2021    E. coli cholecystitis status post percutaneous cholecystostomy drain placement -Had percutaneous cholecystostomy drain placement on 09/07/2021.  Subsequently had completed course of antibiotics -Will eventually need outpatient surgery/IR follow-up    Hypokalemia, hypophosphatemia, elevated liver enzymes, thrombocytopenia, AKI: Resolved. CCRT was discontinued on 09/12/2021.   Diabetes mellitus type 2 with hyperglycemia: Continue Semglee 20 units twice daily and adjust  insulin as needed.  Use NovoLog as needed for hyperglycemia.    Hypertension -Continue monitoring blood pressure.  Currently on doxazosin      Stage II medial sacral ulcer: Not present on admission -Continue local wound care.   Acute urinary retention -Foley catheter removed on 10/13/2021 but Foley catheter was reinserted on 10/14/2021. Continue doxazosin   Moderate protein calorie malnutrition/poor p.o. intake/dysphagia:  He seems to be tolerating dysphagia 2 diet. Cortrak NG tube is still in place.  This will be removed if he continues to tolerate his diet. IR has been told not to proceed with PEG tube placement.  Discussed with Pasty Spillers, IR physician assistant, via secure chat on 10/19.   Hypokalemia, hypophosphatemia, elevated liver enzymes, thrombocytopenia, AKI: Resolved. CCRT was discontinued on 09/12/2021.    Diet Order             DIET - DYS 1 Room service appropriate? No; Fluid consistency: Nectar Thick  Diet effective now                            Consultants: Gastroenterologist, neurologist, intensivist nephrologist, general surgeon, interventional radiologist  Procedures: As noted above    Medications:    Chlorhexidine Gluconate Cloth  6 each Topical Daily   docusate sodium  100 mg Oral BID   doxazosin  2 mg Per Tube Daily   enoxaparin (LOVENOX) injection  40 mg Subcutaneous Q24H   feeding supplement (GLUCERNA SHAKE)  237 mL Oral TID BM   free water  250 mL Per Tube Q6H   influenza vac split quadrivalent PF  0.5 mL Intramuscular Tomorrow-1000   insulin aspart  0-15 Units Subcutaneous Q4H   insulin glargine-yfgn  20 Units Subcutaneous BID   mouth rinse  15 mL Mouth Rinse 4 times per day   pantoprazole  40 mg Per Tube BID   pneumococcal 23 valent vaccine  0.5 mL Intramuscular Tomorrow-1000   polyethylene glycol  17 g Per Tube Daily   rosuvastatin  40 mg Per Tube Daily   sodium chloride flush  10-40 mL Intracatheter Q12H   sodium  chloride flush  5 mL Intracatheter Q8H   Zinc Oxide   Topical BID   Continuous Infusions:  sodium chloride 10 mL/hr at 10/18/21 1524   sodium chloride 250 mL (10/11/21 [id number])   sodium chloride Stopped (10/07/21 1454)   citrate dextrose     dextrose Stopped (10/04/21 1008)   lactated ringers       Anti-infectives (From admission, onward)    Start     Dose/Rate Route Frequency Ordered Stop   10/06/21 1515  cefTRIAXone (ROCEPHIN) 2 g in sodium chloride 0.9 % 100 mL IVPB        2 g 200 mL/hr over 30 Minutes Intravenous Every 24 hours 10/06/21 1420 10/09/21 1756   10/04/21 0945  metroNIDAZOLE (FLAGYL) IVPB 500 mg  Status:  Discontinued        500 mg 100 mL/hr over 60 Minutes Intravenous Every 12 hours 10/04/21 0853 10/06/21 1329   10/03/21 2200  vancomycin (VANCOREADY) IVPB 1500 mg/300 mL        1,500 mg 150 mL/hr over 120 Minutes Intravenous Every 12 hours 10/03/21 0613 10/10/21 0025   10/03/21 0700  vancomycin (VANCOREADY) IVPB 2000 mg/400 mL        2,000 mg 200 mL/hr over 120 Minutes Intravenous  Once 10/03/21 0613 10/03/21 1149   10/03/21 0700  ceFEPIme (MAXIPIME) 2 g in sodium chloride 0.9 % 100 mL IVPB  Status:  Discontinued        2 g 200 mL/hr over 30 Minutes Intravenous Every 8 hours 10/03/21 0613 10/06/21 1329   10/03/21 0645  vancomycin (VANCOREADY) IVPB 2000 mg/400 mL  Status:  Discontinued        2,000 mg 200 mL/hr over 120 Minutes Intravenous  Once 10/03/21 0549 10/03/21 0748   10/03/21 0645  ceFEPIme (MAXIPIME) 2 g in sodium chloride 0.9 % 100 mL IVPB  Status:  Discontinued        2 g 200 mL/hr over 30 Minutes Intravenous  Once 10/03/21 0549 10/03/21 1121   10/03/21 0600  vancomycin (VANCOREADY) IVPB 1500 mg/300 mL  Status:  Discontinued        1,500 mg 150 mL/hr over 120 Minutes Intravenous On call 10/02/21 1334 10/03/21 0549   09/11/21 1100  cefTRIAXone (ROCEPHIN) 2 g in sodium chloride 0.9 % 100 mL IVPB        2 g 200 mL/hr over 30 Minutes Intravenous Every 24  hours 09/11/21 1010 09/13/21 1108   09/08/21 0200  piperacillin-tazobactam (ZOSYN) IVPB 3.375 g  Status:  Discontinued        3.375 g 12.5 mL/hr over 240 Minutes Intravenous Every 8 hours 09/07/21 1856 09/07/21 2114   09/07/21 2200  meropenem (MERREM) 1 g in sodium chloride 0.9 % 100 mL IVPB  Status:  Discontinued        1 g 200 mL/hr over 30 Minutes Intravenous Every 8 hours 09/07/21 2114 09/11/21 1010   09/07/21 1945  piperacillin-tazobactam (ZOSYN) IVPB 3.375 g        3.375 g 100 mL/hr over 30 Minutes Intravenous  Once 09/07/21 1856 09/07/21 2005   09/07/21 1753  cefTRIAXone (ROCEPHIN) injection         Intravenous As needed 09/07/21 1754 09/07/21 1753  09/07/21 1749  sodium chloride 0.9 % with cefTRIAXone (ROCEPHIN) ADS Med       Note to Pharmacy: Eveline Keto E: cabinet override      09/07/21 1749 09/07/21 1935              Family Communication/Anticipated D/C date and plan/Code Status   DVT prophylaxis: enoxaparin (LOVENOX) injection 40 mg Start: 10/21/21 1415 Place and maintain sequential compression device Start: 10/07/21 2222 Place and maintain sequential compression device Start: 09/26/21 1759     Code Status: Full Code  Family Communication: Plan discussed with his wife at the bedside Disposition Plan: Plan to discharge to SNF when medically stable   Status is: Inpatient Remains inpatient appropriate because: Dysphagia, poor oral intake, awaiting placement to SNF       Subjective:   Interval events noted.  He is unable to provide any history because of confusion.  His wife is at the bedside.  She said that patient is eating some.  Objective:    Vitals:   10/23/21 0437 10/23/21 0448 10/23/21 0815 10/23/21 0850  BP:  120/73 130/80   Pulse: 87 88 90 96  Resp: 18 (!) 26 13 14   Temp:      TempSrc:      SpO2: 99% 95%  100%  Weight:      Height:       No data found.  No intake or output data in the 24 hours ending 10/23/21 1003 Filed Weights    10/10/21 1048 10/15/21 1344 10/15/21 1625  Weight: 128.9 kg 125.4 kg 125.4 kg    Exam:  GEN: No acute distress SKIN: Warm and dry EYES: No pallor or icterus ENT: MMM CV: RRR PULM: CTA B ABD: soft, ND, NT, +BS CNS: Alert but disoriented, speech is slurred, he moves all 4 extremities spontaneously EXT: No edema or tenderness GU: Foley catheter draining amber urine    Pressure Injury 10/06/21 Sacrum Medial Stage 2 -  Partial thickness loss of dermis presenting as a shallow open injury with a red, pink wound bed without slough. (Active)  10/06/21 2000  Location: Sacrum  Location Orientation: Medial  Staging: Stage 2 -  Partial thickness loss of dermis presenting as a shallow open injury with a red, pink wound bed without slough.  Wound Description (Comments):   Present on Admission:   Dressing Type Foam - Lift dressing to assess site every shift 10/23/21 0216     Data Reviewed:   I have personally reviewed following labs and imaging studies:  Labs: Labs show the following:   Basic Metabolic Panel: Recent Labs  Lab 10/19/21 0251 10/20/21 0409 10/21/21 0416 10/22/21 0437 10/23/21 0458  NA  --  141  --   --   --   K  --  3.6  --   --   --   CL  --  104  --   --   --   CO2  --  28  --   --   --   GLUCOSE  --  73  --   --   --   BUN  --  12  --   --   --   CREATININE  --  0.64  --   --   --   CALCIUM  --  9.6  --   --   --   MG 2.1 2.2 2.0 2.0 2.1  PHOS 5.1* 5.3* 3.7 3.1 4.1   GFR Estimated Creatinine Clearance: 136 mL/min (  by C-G formula based on SCr of 0.64 mg/dL). Liver Function Tests: No results for input(s): "AST", "ALT", "ALKPHOS", "BILITOT", "PROT", "ALBUMIN" in the last 168 hours. No results for input(s): "LIPASE", "AMYLASE" in the last 168 hours. No results for input(s): "AMMONIA" in the last 168 hours. Coagulation profile No results for input(s): "INR", "PROTIME" in the last 168 hours.  CBC: Recent Labs  Lab 10/19/21 0251 10/20/21 0409  10/21/21 0416 10/22/21 0437 10/23/21 0458  WBC 5.0 4.9 5.4 6.5 5.5  NEUTROABS 3.2 2.6 3.1 4.2 3.3  HGB 8.6* 8.8* 8.7* 8.4* 8.7*  HCT 28.2* 29.1* 27.8* 26.7* 28.5*  MCV 93.4 93.6 93.3 91.8 91.9  PLT 279 275 265 249 259   Cardiac Enzymes: No results for input(s): "CKTOTAL", "CKMB", "CKMBINDEX", "TROPONINI" in the last 168 hours. BNP (last 3 results) No results for input(s): "PROBNP" in the last 8760 hours. CBG: Recent Labs  Lab 10/22/21 1029 10/22/21 1632 10/22/21 2010 10/22/21 2355 10/23/21 0905  GLUCAP 214* 213* 177* 110* 118*   D-Dimer: No results for input(s): "DDIMER" in the last 72 hours. Hgb A1c: No results for input(s): "HGBA1C" in the last 72 hours. Lipid Profile: No results for input(s): "CHOL", "HDL", "LDLCALC", "TRIG", "CHOLHDL", "LDLDIRECT" in the last 72 hours. Thyroid function studies: No results for input(s): "TSH", "T4TOTAL", "T3FREE", "THYROIDAB" in the last 72 hours.  Invalid input(s): "FREET3" Anemia work up: No results for input(s): "VITAMINB12", "FOLATE", "FERRITIN", "TIBC", "IRON", "RETICCTPCT" in the last 72 hours. Sepsis Labs: Recent Labs  Lab 10/20/21 0409 10/21/21 0416 10/22/21 0437 10/23/21 0458  WBC 4.9 5.4 6.5 5.5    Microbiology No results found for this or any previous visit (from the past 240 hour(s)).  Procedures and diagnostic studies:  No results found.             LOS: 13 days   [place] Copywriter, advertising on [email or web address]. If 7PM-7AM, please contact night-coverage at [email or web address]     10/23/2021, 10:03 AM

## 2021-10-23 NOTE — Plan of Care (Signed)
  Problem: Education: Goal: Knowledge of General Education information will improve Description: Including pain rating scale, medication(s)/side effects and non-pharmacologic comfort measures Outcome: Progressing   Problem: Health Behavior/Discharge Planning: Goal: Ability to manage health-related needs will improve Outcome: Progressing   Problem: Clinical Measurements: Goal: Ability to maintain clinical measurements within normal limits will improve Outcome: Progressing Goal: Will remain free from infection Outcome: Progressing Goal: Diagnostic test results will improve Outcome: Progressing Goal: Respiratory complications will improve Outcome: Progressing Goal: Cardiovascular complication will be avoided Outcome: Progressing   Problem: Activity: Goal: Risk for activity intolerance will decrease Outcome: Progressing   Problem: Nutrition: Goal: Adequate nutrition will be maintained Outcome: Progressing   Problem: Coping: Goal: Level of anxiety will decrease Outcome: Progressing   Problem: Elimination: Goal: Will not experience complications related to bowel motility Outcome: Progressing Goal: Will not experience complications related to urinary retention Outcome: Progressing   Problem: Pain Managment: Goal: General experience of comfort will improve Outcome: Progressing   Problem: Safety: Goal: Ability to remain free from injury will improve Outcome: Progressing   Problem: Skin Integrity: Goal: Risk for impaired skin integrity will decrease Outcome: Progressing   Problem: Education: Goal: Ability to describe self-care measures that may prevent or decrease complications (Diabetes Survival Skills Education) will improve Outcome: Progressing Goal: Individualized Educational Video(s) Outcome: Progressing   Problem: Coping: Goal: Ability to adjust to condition or change in health will improve Outcome: Progressing   Problem: Fluid Volume: Goal: Ability to  maintain a balanced intake and output will improve Outcome: Progressing   Problem: Health Behavior/Discharge Planning: Goal: Ability to identify and utilize available resources and services will improve Outcome: Progressing Goal: Ability to manage health-related needs will improve Outcome: Progressing   Problem: Metabolic: Goal: Ability to maintain appropriate glucose levels will improve Outcome: Progressing   Problem: Nutritional: Goal: Maintenance of adequate nutrition will improve Outcome: Progressing Goal: Progress toward achieving an optimal weight will improve Outcome: Progressing   Problem: Skin Integrity: Goal: Risk for impaired skin integrity will decrease Outcome: Progressing   Problem: Tissue Perfusion: Goal: Adequacy of tissue perfusion will improve Outcome: Progressing   Problem: Education: Goal: Understanding of CV disease, CV risk reduction, and recovery process will improve Outcome: Progressing Goal: Individualized Educational Video(s) Outcome: Progressing   Problem: Activity: Goal: Ability to return to baseline activity level will improve Outcome: Progressing   Problem: Cardiovascular: Goal: Ability to achieve and maintain adequate cardiovascular perfusion will improve Outcome: Progressing Goal: Vascular access site(s) Level 0-1 will be maintained Outcome: Progressing   Problem: Health Behavior/Discharge Planning: Goal: Ability to safely manage health-related needs after discharge will improve Outcome: Progressing   Problem: Education: Goal: Knowledge about tracheostomy care/management will improve Outcome: Progressing   Problem: Activity: Goal: Ability to tolerate increased activity will improve Outcome: Progressing   Problem: Health Behavior/Discharge Planning: Goal: Ability to manage tracheostomy will improve Outcome: Progressing   Problem: Respiratory: Goal: Patent airway maintenance will improve Outcome: Progressing   Problem: Role  Relationship: Goal: Ability to communicate will improve Outcome: Progressing   

## 2021-10-23 NOTE — Progress Notes (Signed)
Speech Language Pathology Treatment: Dysphagia  Patient Details Name: Troy Randall MRN: [id number] DOB: January 03, 1961 Today's Date: 10/23/2021 Time: 1256-1310 SLP Time Calculation (min) (ACUTE ONLY): 14 min  Assessment / Plan / Recommendation Clinical Impression  Pt seen for ongoing dysphagia management and decannulated this morning. He was alert throughout session but required moderate cueing to attend to task. Pt presents with a baseline cough; no audible air from stoma noted this session.  Trials of nectar thick liquid via cup and regular consistency were administered. One instance of delayed cough noted with nectar thick liquids. Pt's attention to mastication is improved from previous sessions, however, mastication is still slightly prolonged. Oral holding noted on the last PO trial of regular consistency, but pt cleared entire bolus from oral cavity provided extra time and liquid wash. Pt was able to assist with self feeding throughout session. He continues to experience difficulty following simple commands, however, he produced more verbal language than previous session. Wife educated on feeding strategies. Recommend nectar thick liquids, upgraded Dys 2 diet, medications crushed in puree, with full supervision. SLP will follow for diet toleration and advancement.   HPI HPI: Pt is a 61 y.o. male with history of stroke in 4/23, HTN, HLD and DM with recent severe jejunal bleed s/p vessel coiling who is being seen for altered mental status and concerning white matter changes on MRI. Patient's wife reports that after his stroke in 4/23 he made a good recovery and was able to perform ADLs. He had some residual dysarthria. Pt presented to [facility] on 8/31 with GI bleeding and has remained persistently altered. Intubated 9/3 and trach placed 9/11. He currently has #6 cuffed trach tolerating cuff deflated.      SLP Plan  Continue with current plan of care      Recommendations for follow up  therapy are one component of a multi-disciplinary discharge planning process, led by the attending physician.  Recommendations may be updated based on patient status, additional functional criteria and insurance authorization.    Recommendations  Diet recommendations: Dysphagia 2 (fine chop);Nectar-thick liquid Liquids provided via: Teaspoon;Cup Medication Administration: Crushed with puree Supervision: Full supervision/cueing for compensatory strategies;Trained caregiver to feed patient Compensations: Slow rate;Small sips/bites;Lingual sweep for clearance of pocketing;Monitor for anterior loss;Minimize environmental distractions Postural Changes and/or Swallow Maneuvers: Seated upright 90 degrees                Oral Care Recommendations: Oral care BID;Staff/trained caregiver to provide oral care Follow Up Recommendations: Skilled nursing-short term rehab (<3 hours/day) Assistance recommended at discharge: Frequent or constant Supervision/Assistance SLP Visit Diagnosis: Dysphagia, oropharyngeal phase (R13.12) Plan: Continue with current plan of care          [facility] Student SLP   10/23/2021, 3:14 PM

## 2021-10-23 NOTE — Progress Notes (Addendum)
   NAME:  OLAMIDE CARATTINI, MRN:  [id number], DOB:  [birth year], LOS: 76  Interval/subjective: Red cap placed on 10/17 No overnight issues   Vitals:   10/23/21 0437 10/23/21 0448 10/23/21 0815 10/23/21 0850  BP:  120/73 130/80   Pulse: 87 88 90 96  Resp: 18 (!) 26 13 14   Temp:      TempSrc:      SpO2: 99% 95%  100%  Weight:      Height:       Physical exam: General: Chronically ill middle aged M NAD  HEENT: Trach with red cap in place, secure  Neuro: Awake alert oriented x2 following commands  Chest: Symmetrical chest expansion.  Heart:rrr  Abdomen: soft ndnt  Skin: c/d/w    Assessment and Plan:     Respiratory insufficiency due to poorly characterized demyelinating disease with neurologic compromise, improving  -Required tracheostomy but now overall improved.  Tolerated capping trial. Strong cough  Plans: Decannulate 10/19  Pulm hygiene   CCT: n/a   Eliseo Gum MSN, AGACNP-BC [place] for pager  10/23/2021, 10:17 AM

## 2021-10-23 NOTE — Progress Notes (Signed)
   Inpatient Rehab Admissions Coordinator :  Per therapy change in recommendations patient was screened for CIR candidacy by Danne Baxter RN MSN. Noted LTACH denial. Patient is not yet at a level to tolerate the intensity required to pursue a CIR admit . Bed level OT session today. Patient may have the potential to progress to become a candidate. The CIR admissions team will follow and monitor for progress and place a Rehab Consult order if felt to be appropriate. Please contact me with any questions.  Danne Baxter RN MSN Admissions Coordinator [phone]

## 2021-10-23 NOTE — Progress Notes (Signed)
Supervising Physician: Corrie Mckusick  Patient Status:  [facility] - In-pt  Chief Complaint:  seen in consult for G-tube and chole tube exchange  History of Present Illness: Troy Randall is a 61 y.o. male known to IR for several procedures this admission.   --GI bleed for embolization --cholecystostomy tube placement --dialysis catheter placement  Subjective:  Lying in bed, restless, accompanied by wife bedside  Allergies: Metformin and related and Penicillins  Medications: Prior to Admission medications   Medication Sig Start Date End Date Taking? Authorizing Provider  amLODipine (NORVASC) 10 MG tablet Take 1 tablet (10 mg total) by mouth daily. 06/15/17  Yes Caren Macadam, MD  aspirin EC 81 MG tablet Take 81 mg by mouth daily. Swallow whole.   Yes [provider]  clopidogrel (PLAVIX) 75 MG tablet Take 1 tablet (75 mg total) by mouth daily. 04/30/21  Yes Little Ishikawa, MD  glipiZIDE (GLUCOTROL XL) 10 MG 24 hr tablet Take 1 tablet (10 mg total) by mouth daily with breakfast. 04/16/17  Yes Hagler, Apolonio Schneiders, MD  levocetirizine (XYZAL) 5 MG tablet Take 1 tablet by mouth at bedtime.   Yes [provider]  lisinopril (PRINIVIL,ZESTRIL) 40 MG tablet Take 1 tablet (40 mg total) by mouth daily. 05/12/17  Yes Caren Macadam, MD  metFORMIN (GLUCOPHAGE-XR) 500 MG 24 hr tablet Take 500-1,000 mg by mouth in the morning and at bedtime. 02/09/21  Yes [provider]  rosuvastatin (CRESTOR) 40 MG tablet Take 1 tablet (40 mg total) by mouth daily. 04/29/21  Yes Little Ishikawa, MD  tamsulosin (FLOMAX) 0.4 MG CAPS capsule Take 0.4 mg by mouth daily. 02/12/21  Yes [provider]  traZODone (DESYREL) 150 MG tablet Take 150 mg by mouth at bedtime. 02/04/21  Yes [provider]  TRESIBA FLEXTOUCH 200 UNIT/ML FlexTouch Pen Inject 20 Units into the skin in the morning and at bedtime. 03/14/21  Yes [provider]  BD VEO INSULIN SYRINGE U/F 31G X  15/64" 1 ML MISC  USE AS DIRECTED 06/08/17   Caren Macadam, MD  glucose blood (ONETOUCH VERIO) test strip TEST twice a day 05/04/17   Caren Macadam, MD  Insulin Pen Needle (NOVOTWIST) 32G X 5 MM MISC Use two daily to inject Victoza and Toujeo. 04/25/15   Elayne Snare, MD  [facility] DELICA LANCETS 17P MISC Use to check blood sugar once a day dx code E11.65 11/21/14   Elayne Snare, MD   Vital Signs: BP 130/80 (BP Location: Left Arm)   Pulse 96   Temp 98.5 F (36.9 C) (Oral)   Resp 14   Ht 6' (1.829 m)   Wt 276 lb 7.3 oz (125.4 kg)   SpO2 100%   BMI 37.49 kg/m   Physical Exam Vitals reviewed.  Constitutional:      General: He is awake.  HENT:     Head: Normocephalic and atraumatic.     Mouth/Throat:     Pharynx: Oropharynx is clear.  Cardiovascular:     Rate and Rhythm: Normal rate.  Pulmonary:     Effort: Pulmonary effort is normal. No respiratory distress.  Psychiatric:        Behavior: Behavior is cooperative.     Imaging: DG Abd Portable 1V  Result Date: 10/20/2021 CLINICAL DATA:  Feeding tube placement EXAM: PORTABLE ABDOMEN - 1 VIEW COMPARISON:  CT 4 days ago. FINDINGS: Soft feeding tube enters the stomach, loops in the fundus and has its tip in the region of the antrum.  IMPRESSION: Soft feeding tube tip in the region of the antrum of the stomach. Electronically Signed   By: Paulina Fusi M.D.   On: 10/20/2021 12:29    Labs:  CBC: Recent Labs    10/20/21 0409 10/21/21 0416 10/22/21 0437 10/23/21 0458  WBC 4.9 5.4 6.5 5.5  HGB 8.8* 8.7* 8.4* 8.7*  HCT 29.1* 27.8* 26.7* 28.5*  PLT 275 265 249 259    COAGS: Recent Labs    04/28/21 1504 09/05/21 0307 09/06/21 2240 09/07/21 2056 10/03/21 0816  INR 1.0 1.2 1.6* 2.0* 1.0  APTT 26  --   --  32 <20*    BMP: Recent Labs    10/13/21 0306 10/13/21 1400 10/15/21 0312 10/20/21 0409  NA 142 141 140 141  K 3.5 3.8 3.7 3.6  CL 106 107 105 104  CO2 27 28 27 28   GLUCOSE 113* 164* 117* 73  BUN 8 10 14 12    CALCIUM 9.0 8.5* 8.8* 9.6  CREATININE 0.49* 0.52* 0.51* 0.64  GFRNONAA >60 >60 >60 >60    LIVER FUNCTION TESTS: Recent Labs    10/09/21 0334 10/10/21 0317 10/11/21 0304 10/12/21 0312  BILITOT 0.8 0.6 0.8 0.6  AST 57* 40 26 28  ALT 81* 75* 34 37  ALKPHOS 69 71 37* 47  PROT 4.7* 4.6* 4.7* 4.8*  ALBUMIN 3.7 3.3* 3.7 3.6    Assessment and Plan:  6 weeks s/p cholecystostomy tube placement --plan for cholangiogram and routine exchange, tentatively tomorrow --consent signed and in IR G-tube request --wife reports patient able to take PO and does not need G-tube.  Will cancel this for now.    IR available as needed   Electronically Signed: 12/11/21, PA 10/23/2021, 1:34 PM   I spent a total of 15 Minutes at the the patient's bedside AND on the patient's hospital floor or unit, greater than 50% of which was counseling/coordinating care for cholecystostomy exchange

## 2021-10-23 NOTE — Progress Notes (Signed)
Pt decannulated without any issues. Paper tape and 2x2 applied to stoma.

## 2021-10-23 NOTE — Progress Notes (Signed)
OT Cancellation Note  Patient Details Name: Troy Randall MRN: [id number] DOB: 09-09-1960   Cancelled Treatment:    Reason Eval/Treat Not Completed: Other (comment) (Pt currently being fed lunch, will try back as schedule allows.)  [name] 10/23/2021, 2:12 PM Cleta Alberts, OTR/L [place] Office: [phone]

## 2021-10-24 ENCOUNTER — Inpatient Hospital Stay (HOSPITAL_COMMUNITY): Payer: Medicare HMO

## 2021-10-24 DIAGNOSIS — G9341 Metabolic encephalopathy: Secondary | ICD-10-CM | POA: Diagnosis not present

## 2021-10-24 DIAGNOSIS — K810 Acute cholecystitis: Secondary | ICD-10-CM | POA: Diagnosis not present

## 2021-10-24 HISTORY — PX: IR CATHETER TUBE CHANGE: IMG717

## 2021-10-24 HISTORY — PX: IR EXCHANGE BILIARY DRAIN: IMG6046

## 2021-10-24 LAB — CBC WITH DIFFERENTIAL/PLATELET
Abs Immature Granulocytes: 0.05 10*3/uL (ref 0.00–0.07)
Basophils Absolute: 0 10*3/uL (ref 0.0–0.1)
Basophils Relative: 1 %
Eosinophils Absolute: 0.1 10*3/uL (ref 0.0–0.5)
Eosinophils Relative: 2 %
HCT: 28.2 % — ABNORMAL LOW (ref 39.0–52.0)
Hemoglobin: 8.8 g/dL — ABNORMAL LOW (ref 13.0–17.0)
Immature Granulocytes: 1 %
Lymphocytes Relative: 29 %
Lymphs Abs: 1.6 10*3/uL (ref 0.7–4.0)
MCH: 28.4 pg (ref 26.0–34.0)
MCHC: 31.2 g/dL (ref 30.0–36.0)
MCV: 91 fL (ref 80.0–100.0)
Monocytes Absolute: 0.6 10*3/uL (ref 0.1–1.0)
Monocytes Relative: 11 %
Neutro Abs: 3.2 10*3/uL (ref 1.7–7.7)
Neutrophils Relative %: 56 %
Platelets: 271 10*3/uL (ref 150–400)
RBC: 3.1 MIL/uL — ABNORMAL LOW (ref 4.22–5.81)
RDW: 15 % (ref 11.5–15.5)
WBC: 5.7 10*3/uL (ref 4.0–10.5)
nRBC: 0 % (ref 0.0–0.2)

## 2021-10-24 LAB — GLUCOSE, CAPILLARY
Glucose-Capillary: 104 mg/dL — ABNORMAL HIGH (ref 70–99)
Glucose-Capillary: 102 mg/dL — ABNORMAL HIGH (ref 70–99)
Glucose-Capillary: 112 mg/dL — ABNORMAL HIGH (ref 70–99)
Glucose-Capillary: 174 mg/dL — ABNORMAL HIGH (ref 70–99)
Glucose-Capillary: 139 mg/dL — ABNORMAL HIGH (ref 70–99)

## 2021-10-24 LAB — MAGNESIUM: Magnesium: 2.1 mg/dL (ref 1.7–2.4)

## 2021-10-24 LAB — PHOSPHORUS: Phosphorus: 4.5 mg/dL (ref 2.5–4.6)

## 2021-10-24 MED ORDER — LIDOCAINE HCL 1 % IJ SOLN
INTRAMUSCULAR | Status: AC
  Filled 2021-10-24: qty 20

## 2021-10-24 MED ORDER — INSULIN GLARGINE-YFGN 100 UNIT/ML ~~LOC~~ SOLN
15.0000 [IU] | Freq: Two times a day (BID) | SUBCUTANEOUS | Status: DC
  Administered 2021-10-25 – 2021-10-30 (×12): 15 [IU] via SUBCUTANEOUS
  Filled 2021-10-24 (×13): qty 0.15

## 2021-10-24 NOTE — NC FL2 (Addendum)
MEDICAID FL2 LEVEL OF CARE SCREENING TOOL     IDENTIFICATION  Patient Name: Troy Randall Birthdate: [birth year] Sex: male Admission Date (Current Location): 09/04/2021  [facility] and [place] Number:  Producer, television/film/video and Address:  [facility], [address] [place], [place] [address]      Provider Number: [id number]  Attending Physician Name and Address:  Lurene Shadow, MD  Relative Name and Phone Number:  Douglass, [place]   [phone]    Current Level of Care: Hospital Recommended Level of Care: Skilled Nursing Facility Prior Approval Number:  [id number]  Date Approved/Denied:   PASRR Number:    Discharge Plan: SNF    Current Diagnoses: Patient Active Problem List   Diagnosis Date Noted   Acute respiratory insufficiency 10/23/2021   Pressure injury of skin 10/07/2021   Anemia due to blood loss 09/18/2021   History of lower GI bleeding: 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  09/18/2021   H/O ischemic bowel disease 09/18/2021   Acute emphysematous cholecystitis 09/18/2021   Chronic respiratory failure with hypoxia (HCC)    Tracheostomy status (HCC)    Acute metabolic encephalopathy    Acute CVA (cerebrovascular accident) (HCC) 04/28/2021   Cannot sleep 08/26/2015   Chronic right-sided low back pain with sciatica 07/10/2015   Uncontrolled type 2 diabetes mellitus with hyperglycemia, without long-term current use of insulin (HCC) 06/19/2015   Hyperlipidemia 11/29/2013   Essential hypertension 11/28/2013    Orientation RESPIRATION BLADDER Height & Weight     Self  Normal Incontinent, Indwelling catheter Weight: 276 lb 7.3 oz (125.4 kg) Height:  6' (182.9 cm)  BEHAVIORAL SYMPTOMS/MOOD NEUROLOGICAL BOWEL NUTRITION STATUS      Incontinent Diet (see discharge summary)  AMBULATORY STATUS COMMUNICATION OF NEEDS Skin   Total Care Verbally Other (Comment) (redness)                       Personal  Care Assistance Level of Assistance  Bathing, Feeding, Dressing, Total care Bathing Assistance: Maximum assistance Feeding assistance: Maximum assistance Dressing Assistance: Maximum assistance Total Care Assistance: Maximum assistance   Functional Limitations Info  Sight, Hearing, Speech Sight Info: Adequate Hearing Info: Adequate Speech Info: Adequate    SPECIAL CARE FACTORS FREQUENCY  PT (By licensed PT), OT (By licensed OT)     PT Frequency: 5x week OT Frequency: 5x week            Contractures Contractures Info: Not present    Additional Factors Info  Code Status, Allergies, Insulin Sliding Scale Code Status Info: full Allergies Info: Metformin And Related, Penicillins   Insulin Sliding Scale Info: Novolog, see discharge summary       Current Medications (10/24/2021):  This is the current hospital active medication list Current Facility-Administered Medications  Medication Dose Route Frequency Provider Last Rate Last Admin   0.9 %  sodium chloride infusion   Intravenous PRN Charlott Holler, MD 10 mL/hr at 10/18/21 1524 Infusion Verify at 10/18/21 1524   0.9 %  sodium chloride infusion  250 mL Intravenous Continuous Martina Sinner, MD 10 mL/hr at 10/11/21 0633 250 mL at 10/11/21 0633   0.9 %  sodium chloride infusion   Intravenous PRN Meredeth Ide, MD   Stopped at 10/07/21 1454   acetaminophen (TYLENOL) tablet 650 mg  650 mg Oral Q4H PRN Milon Dikes, MD   650 mg at 10/23/21 2113   Chlorhexidine Gluconate Cloth 2 %  PADS 6 each  6 each Topical Daily Kathie Dike, MD   6 each at 10/23/21 1105   citrate dextrose (ACD-A anticoagulant) solution 1,000 mL  1,000 mL Other Continuous Amie Portland, MD   1,000 mL at 10/15/21 1627   dextrose 10 % infusion   Intravenous Continuous PRN Priscella Mann, RPH   Stopped at 10/04/21 1008   diphenhydrAMINE (BENADRYL) capsule 25 mg  25 mg Oral Q6H PRN Amie Portland, MD   25 mg at 10/15/21 2142   docusate sodium (COLACE)  capsule 100 mg  100 mg Oral BID Darliss Cheney, MD       doxazosin (CARDURA) tablet 2 mg  2 mg Oral Daily Jennye Boroughs, MD       enoxaparin (LOVENOX) injection 40 mg  40 mg Subcutaneous Q24H Dimple Nanas, RPH   40 mg at 10/23/21 1425   feeding supplement (GLUCERNA SHAKE) (GLUCERNA SHAKE) liquid 237 mL  237 mL Oral TID BM Jennye Boroughs, MD   237 mL at 10/23/21 2115   food thickener (SIMPLYTHICK (NECTAR/LEVEL 2/MILDLY THICK)) 10 packet  10 packet Oral PRN Darliss Cheney, MD       free water 250 mL  250 mL Per Tube Q6H [name], MD   250 mL at 10/22/21 0509   hydrALAZINE (APRESOLINE) injection 10 mg  10 mg Intravenous Q6H PRN Laqueta Jean, MD       influenza vac split quadrivalent PF (FLUARIX) injection 0.5 mL  0.5 mL Intramuscular Tomorrow-1000 Pokhrel, Laxman, MD       insulin aspart (novoLOG) injection 0-15 Units  0-15 Units Subcutaneous Q4H Pokhrel, Laxman, MD   2 Units at 10/23/21 2114   insulin glargine-yfgn (SEMGLEE) injection 20 Units  20 Units Subcutaneous BID Darliss Cheney, MD   20 Units at 10/23/21 2114   lactated ringers bolus 500 mL  500 mL Intravenous Daily PRN Katsadouros, Vasilios, MD       lidocaine (XYLOCAINE) 1 % (with pres) injection            ondansetron (ZOFRAN) tablet 4 mg  4 mg Oral Q6H PRN Kathie Dike, MD       Or   ondansetron (ZOFRAN) injection 4 mg  4 mg Intravenous Q6H PRN Kathie Dike, MD   4 mg at 10/21/21 0350   Oral care mouth rinse  15 mL Mouth Rinse 4 times per day Freddi Starr, MD   15 mL at 10/23/21 2117   Oral care mouth rinse  15 mL Mouth Rinse PRN Freddi Starr, MD       pantoprazole (PROTONIX) EC tablet 40 mg  40 mg Oral BID Jennye Boroughs, MD   40 mg at 10/23/21 2113   pneumococcal 23 valent vaccine (PNEUMOVAX-23) injection 0.5 mL  0.5 mL Intramuscular Tomorrow-1000 Pokhrel, Laxman, MD       polyethylene glycol (MIRALAX / GLYCOLAX) packet 17 g  17 g Oral Daily Jennye Boroughs, MD       rosuvastatin (CRESTOR) tablet 40 mg   40 mg Oral Daily Jennye Boroughs, MD       sodium chloride flush (NS) 0.9 % injection 10-40 mL  10-40 mL Intracatheter Q12H Spero Geralds, MD   10 mL at 10/23/21 2117   sodium chloride flush (NS) 0.9 % injection 10-40 mL  10-40 mL Intracatheter PRN Spero Geralds, MD   10 mL at 10/24/21 0409   sodium chloride flush (NS) 0.9 % injection 5 mL  5 mL Intracatheter Q8H Sandi Mariscal,  MD   5 mL at 10/24/21 0433   Zinc Oxide (TRIPLE PASTE) 12.8 % ointment   Topical BID Luciano Cutter, MD   Given at 10/23/21 2117     Discharge Medications: Please see discharge summary for a list of discharge medications.  Relevant Imaging Results:  Relevant Lab Results:   Additional Information SSN: [id number].  Pt is vaccinated for covid but not boosted.  Lorri Frederick, LCSW

## 2021-10-24 NOTE — Progress Notes (Signed)
Occupational Therapy Treatment Patient Details Name: Troy Randall MRN: [id number] DOB: 06-06-1960 Today's Date: 10/24/2021   History of present illness Patient is a 61 y/o male who presents on 09/04/21 with dark stools, dizziness and SOB. Found to have GI bleed and acute anemia s/p embolization 09/06/21 and percutaneous cholecystostomy 09/07/21. Intubated 9/3, CRRT 9/5-9/8. Hemorrhagic and septic shock 9/5. Trach placed 9/11. 9/16 MRI rapid development numerous round white matter lesions in both cerebral hemispheres; 9/17 MRI: numerous white matter lesions consistent with active demyelination - concerning for MS, ADEM, and cerebral vasculitis; 9/18 Lumbar puncture. Pt returned to ICU on 9/29 with aspiration PNA, tachypnea, tachycardia and increased WOB. 10/2 - pt transferred back to [facility]; started having GIB. Underwent sigmoidoscopy on 10/07/2021. Plasma exchange started. 10/17 trach capped. 10/19 decanulated. s/p Cholecystostomy tube exchange 10/24/21. PMH includes CVA, DM, HTN.   OT comments  Pt continues to required +2 mod to max assist for side to sit, but is able to pull himself up in bed using head board. Pt stood from elevated surface and RW with +2 min assist and lower surface with +2 mod assist. Demonstrated ability to ambulate with +2 mod assist and wife following with chair. HR to 126 bpm. Pt needing multimodal cues for all mobility and hand placement, attention continues to limit. Pt assisted in pulling up socks once started over toes in figure 4 position, mod assist to change soiled gown. Pt remains an excellent inpatient rehab candidate.    Recommendations for follow up therapy are one component of a multi-disciplinary discharge planning process, led by the attending physician.  Recommendations may be updated based on patient status, additional functional criteria and insurance authorization.    Follow Up Recommendations  Acute inpatient rehab (3hours/day)    Assistance Recommended at  Discharge Frequent or constant Supervision/Assistance  Patient can return home with the following  Two people to help with walking and/or transfers;A lot of help with bathing/dressing/bathroom;Assistance with feeding;Assistance with cooking/housework;Direct supervision/assist for medications management;Direct supervision/assist for financial management;Assist for transportation;Help with stairs or ramp for entrance   Equipment Recommendations  BSC/3in1;Wheelchair (measurements OT);Wheelchair cushion (measurements OT);Hospital bed    Recommendations for Other Services      Precautions / Restrictions Precautions Precautions: Fall Precaution Comments: decannulated, cortrack, R drain Restrictions Weight Bearing Restrictions: No Other Position/Activity Restrictions: pt unable to sit on bottom for extended periods of time due to pain (even on geomat)       Mobility Bed Mobility Overal bed mobility: Needs Assistance Bed Mobility: Rolling, Sidelying to Sit, Sit to Sidelying Rolling: Mod assist Sidelying to sit: Max assist, +2 for safety/equipment, +2 for physical assistance, HOB elevated     Sit to sidelying: +2 for physical assistance, Max assist General bed mobility comments: increased time and multimodal cues for each step to sequence    Transfers Overall transfer level: Needs assistance Equipment used: Rolling walker (2 wheels) Transfers: Sit to/from Stand Sit to Stand: +2 physical assistance, Mod assist, Min assist           General transfer comment: +2 min from elevated bed, +2 mod from lower chair, multimodal cues for hand placement     Balance Overall balance assessment: Needs assistance   Sitting balance-Leahy Scale: Fair Sitting balance - Comments: prefers UE support Postural control: Right lateral lean Standing balance support: During functional activity, Bilateral upper extremity supported, Reliant on assistive device for balance Standing balance-Leahy Scale:  Poor  ADL either performed or assessed with clinical judgement   ADL Overall ADL's : Needs assistance/impaired                 Upper Body Dressing : Moderate assistance;Sitting   Lower Body Dressing: Bed level;Maximal assistance Lower Body Dressing Details (indicate cue type and reason): can peform figure four to reach feet to pull up sock once started over feet             Functional mobility during ADLs: +2 for physical assistance;Moderate assistance;Rolling walker (2 wheels);Cueing for safety;Cueing for sequencing      Extremity/Trunk Assessment Upper Extremity Assessment LUE Deficits / Details: pt able to use LUE for balance with RW            Vision       Perception     Praxis      Cognition Arousal/Alertness: Awake/alert Behavior During Therapy: Flat affect (pt smiling and emotional after walking) Overall Cognitive Status: Impaired/Different from baseline Area of Impairment: Following commands, Attention, Problem solving, Safety/judgement                   Current Attention Level: Focused   Following Commands: Follows one step commands with increased time, Follows one step commands inconsistently Safety/Judgement: Decreased awareness of deficits, Decreased awareness of safety   Problem Solving: Slow processing, Decreased initiation, Difficulty sequencing, Requires verbal cues, Requires tactile cues General Comments: Continues to require multimodal cues to follow 1-2 step commands with repetition, Poor attention. Smiling today and almost in tears due to having walked for the first time in 50 days, some improved insight.        Exercises      Shoulder Instructions       General Comments Wife present and helpful. HR up to 126 bpm during activity but returns to baseline quickly- 90s bpm.    Pertinent Vitals/ Pain       Pain Assessment Pain Assessment: Faces Faces Pain Scale: Hurts little more Pain  Location: bottom/back Pain Descriptors / Indicators: Discomfort, Restless Pain Intervention(s): Monitored during session, Repositioned  Home Living                                          Prior Functioning/Environment              Frequency  Min 2X/week        Progress Toward Goals  OT Goals(current goals can now be found in the care plan section)  Progress towards OT goals: Progressing toward goals  Acute Rehab OT Goals OT Goal Formulation: With family Time For Goal Achievement: 10/30/21 Potential to Achieve Goals: [place] Discharge plan remains appropriate    Co-evaluation    PT/OT/SLP Co-Evaluation/Treatment: Yes Reason for Co-Treatment: For patient/therapist safety PT goals addressed during session: Mobility/safety with mobility;Balance;Strengthening/ROM;Proper use of DME OT goals addressed during session: Strengthening/ROM;ADL's and self-care      AM-PAC OT "6 Clicks" Daily Activity     Outcome Measure   Help from another person eating meals?: A Lot Help from another person taking care of personal grooming?: Total Help from another person toileting, which includes using toliet, bedpan, or urinal?: Total Help from another person bathing (including washing, rinsing, drying)?: Total Help from another person to put on and taking off regular upper body clothing?: A Lot Help from another person to put on and taking off regular  lower body clothing?: A Lot 6 Click Score: 9    End of Session Equipment Utilized During Treatment: Gait belt;Rolling walker (2 wheels)  OT Visit Diagnosis: Muscle weakness (generalized) (M62.81);Other symptoms and signs involving cognitive function;Pain;Unsteadiness on feet (R26.81)   Activity Tolerance Patient tolerated treatment well   Patient Left in bed;with call bell/phone within reach;with family/visitor present   Nurse Communication Mobility status        Time: 9030-0923 OT Time Calculation (min):  39 min  Charges: OT General Charges $OT Visit: 1 Visit OT Treatments $Self Care/Home Management : 8-22 mins  Cleta Alberts, OTR/L Acute Rehabilitation Services Office: [phone]   [name] 10/24/2021, 3:11 PM

## 2021-10-24 NOTE — Progress Notes (Signed)
Nutrition Follow-up  DOCUMENTATION CODES:   Obesity unspecified  INTERVENTION:  Continue Glucerna Shake po TID, each supplement provides 220 kcal and 10 grams of protein (pt prefers chocolate) Continue Magic cup TID with meals Request updated weight   NUTRITION DIAGNOSIS:   Increased nutrient needs related to acute illness as evidenced by estimated needs.  Ongoing  GOAL:   Patient will meet greater than or equal to 90% of their needs  Goal unmet  MONITOR:   PO intake, Supplement acceptance, Diet advancement  REASON FOR ASSESSMENT:   Consult Enteral/tube feeding initiation and management  ASSESSMENT:   Pt with PMH of HTN, HLD, DM, CVA on ASA/plavix admitted 8/31 from [facility] due to GI bleed.  08/31 - admit 09/02 - s/p colonoscopy showing no active bleed but large clots throughout entire colon; s/p EGD showing small hital hernia, erosive gastropathy, non-bleeding duodenal diverticulum; s/p small bowel enteroscopy showing jejunal diverticular bleeding s/p coil embolization  09/03 - intubated; new cholecystitis s/p IR for cholecystectomy tube  09/04 - CRRT start 09/08 - CRRT stop 09/11 - s/p trach 09/12 - Cortrak placed (tip in descending duodenum) 09/15 - s/p flexible sigmoidoscopy 09/18 - s/p LP 09/29 - concern for aspiration due to bilious discharge from trach site, TF held, transferred to ICU 09/30 - TF resumed 10/01 - s/p HD catheter placement for PLEX 10/02 - first PLEX 10/11- last PLEX session 10/18 - TF d/c 10/19-  decannulated    Noted plans for CIR versus SNF. Pending placement.   No meal completions documented yesterday. Spoke with pt's wife at bedside. Pt shook his head "yes" on occasion to few questions. His wife reports that he did not eat as well yesterday as he had a lot going on with decannulation and was tired afterwards. No documented meal completions by nursing on file for yesterdays intakes. Pt's wife reports that he does not typically eat  breakfast so his breakfasts have been on the lighter side except when he receives waffles and sausage.   Meal completions: 10/15: 40% lunch, 40% dinner 10/18: 25% breakfast  He is enjoying the chocolate flavored items such as magic cup and Glucerna. Will continue to offer these supplements to optimize nutritional intake  Medications: colace, SSI 0-15 units q4h, semglee 15 units BID, protonix, miralax  Labs: CBG's 102-140 x24 hours  UOP: 869ml x24 hours I/O's: -11.8 since 10/6  NUTRITION - FOCUSED PHYSICAL EXAM:  Flowsheet Row Most Recent Value  Orbital Region No depletion  Upper Arm Region No depletion  Thoracic and Lumbar Region No depletion  Buccal Region No depletion  Temple Region Mild depletion  Clavicle Bone Region No depletion  Clavicle and Acromion Bone Region No depletion  Scapular Bone Region No depletion  Dorsal Hand No depletion  Patellar Region Mild depletion  Anterior Thigh Region Mild depletion  Posterior Calf Region No depletion  Edema (RD Assessment) None  Hair Reviewed  Eyes Reviewed  Mouth Unable to assess  Skin Reviewed  Nails Reviewed       Diet Order:   Diet Order             DIET DYS 2 Room service appropriate? Yes; Fluid consistency: Nectar Thick  Diet effective now                   EDUCATION NEEDS:   No education needs have been identified at this time  Skin:  Skin Assessment: Skin Integrity Issues: Skin Integrity Issues:: Stage II Stage II: medial sacrum  Last  BM:  10/19  Height:   Ht Readings from Last 1 Encounters:  10/06/21 6' (1.829 m)    Weight:   Wt Readings from Last 1 Encounters:  10/15/21 125.4 kg    Ideal Body Weight:  80.9 kg  BMI:  Body mass index is 37.49 kg/m.  Estimated Nutritional Needs:   Kcal:  2200-2500  Protein:  130-150 grams  Fluid:  >2 L/day  Clayborne Dana, RDN, LDN Clinical Nutrition

## 2021-10-24 NOTE — TOC Initial Note (Signed)
Transition of Care [facility]) - Initial/Assessment Note    Patient Details  Name: Troy Randall MRN: [id number] Date of Birth: October 25, 1960  Transition of Care [facility] CM/SW Contact:    Lorri Frederick, LCSW Phone Number: 10/24/2021, 10:40 AM  Clinical Narrative:    CSW spoke with pt wife Troy Randall regarding DC plan.  Pt continues to be oriented x1, did not participate.  Troy Randall is interested in CIR as primary DC plan.  Discussed SNF as back up plan and she is OK with this, but wanted it made clear that CIR is the goal.  Permission given to send out SNF referral on hub, would want a [place] facility.  Pt is vaccinated for covid but not boosted.                 Expected Discharge Plan: IP Rehab Facility Barriers to Discharge: Continued Medical Work up   Patient Goals and CMS Choice Patient states their goals for this hospitalization and ongoing recovery are:: To return home   Choice offered to / list presented to : Spouse  Expected Discharge Plan and Services Expected Discharge Plan: IP Rehab Facility In-house Referral: Clinical Social Work Discharge Planning Services: CM Consult Post Acute Care Choice: IP Rehab Living arrangements for the past 2 months: Single Family Home                                      Prior Living Arrangements/Services Living arrangements for the past 2 months: Single Family Home Lives with:: Spouse Patient language and need for interpreter reviewed:: No Do you feel safe going back to the place where you live?: Yes      Need for Family Participation in Patient Care: Yes (Comment) Care giver support system in place?: Yes (comment) Current home services: Other (comment) (none) Criminal Activity/Legal Involvement Pertinent to Current Situation/Hospitalization: No - Comment as needed  Activities of Daily Living Home Assistive Devices/Equipment: CBG Meter ADL Screening (condition at time of admission) Patient's cognitive ability adequate to  safely complete daily activities?: Yes Is the patient deaf or have difficulty hearing?: No Does the patient have difficulty seeing, even when wearing glasses/contacts?: No Does the patient have difficulty concentrating, remembering, or making decisions?: No Patient able to express need for assistance with ADLs?: Yes Does the patient have difficulty dressing or bathing?: No Independently performs ADLs?: Yes (appropriate for developmental age) Does the patient have difficulty walking or climbing stairs?: No Weakness of Legs: None Weakness of Arms/Hands: None  Permission Sought/Granted Permission sought to share information with : Case Manager, Family Supports Permission granted to share information with : Yes, Verbal Permission Granted              Emotional Assessment Appearance:: Appears stated age Attitude/Demeanor/Rapport: Unable to Assess Affect (typically observed): Unable to Assess Orientation: : Oriented to Self Alcohol / Substance Use: Not Applicable Psych Involvement: No (comment)  Admission diagnosis:  GI bleed [K92.2] Lower GI bleed [K92.2] Patient Active Problem List   Diagnosis Date Noted   Acute respiratory insufficiency 10/23/2021   Pressure injury of skin 10/07/2021   Anemia due to blood loss 09/18/2021   History of lower GI bleeding: 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  09/18/2021   H/O ischemic bowel disease 09/18/2021   Acute emphysematous cholecystitis 09/18/2021   Chronic respiratory failure with hypoxia (HCC)    Tracheostomy status (HCC)  Acute metabolic encephalopathy    Acute CVA (cerebrovascular accident) ([place]) 04/28/2021   Cannot sleep 08/26/2015   Chronic right-sided low back pain with sciatica 07/10/2015   Uncontrolled type 2 diabetes mellitus with hyperglycemia, without long-term current use of insulin ([place]) 06/19/2015   Hyperlipidemia 11/29/2013   Essential hypertension 11/28/2013   PCP:  [name], MD Pharmacy:    [place], [place] - [address] [place] [address] [id number] [place] [address] Phone: [phone] Fax: [phone]     Social Determinants of Health (SDOH) Interventions    Readmission Risk Interventions     No data to display

## 2021-10-24 NOTE — Plan of Care (Signed)
  Problem: Education: Goal: Knowledge of General Education information will improve Description: Including pain rating scale, medication(s)/side effects and non-pharmacologic comfort measures Outcome: Progressing   Problem: Health Behavior/Discharge Planning: Goal: Ability to manage health-related needs will improve Outcome: Progressing   Problem: Clinical Measurements: Goal: Ability to maintain clinical measurements within normal limits will improve Outcome: Progressing Goal: Will remain free from infection Outcome: Progressing Goal: Diagnostic test results will improve Outcome: Progressing Goal: Respiratory complications will improve Outcome: Progressing Goal: Cardiovascular complication will be avoided Outcome: Progressing   Problem: Activity: Goal: Risk for activity intolerance will decrease Outcome: Progressing   Problem: Nutrition: Goal: Adequate nutrition will be maintained Outcome: Progressing   Problem: Coping: Goal: Level of anxiety will decrease Outcome: Progressing   Problem: Elimination: Goal: Will not experience complications related to bowel motility Outcome: Progressing Goal: Will not experience complications related to urinary retention Outcome: Progressing   Problem: Pain Managment: Goal: General experience of comfort will improve Outcome: Progressing   Problem: Safety: Goal: Ability to remain free from injury will improve Outcome: Progressing   Problem: Skin Integrity: Goal: Risk for impaired skin integrity will decrease Outcome: Progressing   Problem: Education: Goal: Ability to describe self-care measures that may prevent or decrease complications (Diabetes Survival Skills Education) will improve Outcome: Progressing Goal: Individualized Educational Video(s) Outcome: Progressing   Problem: Coping: Goal: Ability to adjust to condition or change in health will improve Outcome: Progressing   Problem: Fluid Volume: Goal: Ability to  maintain a balanced intake and output will improve Outcome: Progressing   Problem: Health Behavior/Discharge Planning: Goal: Ability to identify and utilize available resources and services will improve Outcome: Progressing Goal: Ability to manage health-related needs will improve Outcome: Progressing   Problem: Metabolic: Goal: Ability to maintain appropriate glucose levels will improve Outcome: Progressing   Problem: Nutritional: Goal: Maintenance of adequate nutrition will improve Outcome: Progressing Goal: Progress toward achieving an optimal weight will improve Outcome: Progressing   Problem: Skin Integrity: Goal: Risk for impaired skin integrity will decrease Outcome: Progressing   Problem: Tissue Perfusion: Goal: Adequacy of tissue perfusion will improve Outcome: Progressing   Problem: Education: Goal: Understanding of CV disease, CV risk reduction, and recovery process will improve Outcome: Progressing Goal: Individualized Educational Video(s) Outcome: Progressing   Problem: Activity: Goal: Ability to return to baseline activity level will improve Outcome: Progressing   Problem: Cardiovascular: Goal: Ability to achieve and maintain adequate cardiovascular perfusion will improve Outcome: Progressing Goal: Vascular access site(s) Level 0-1 will be maintained Outcome: Progressing   Problem: Health Behavior/Discharge Planning: Goal: Ability to safely manage health-related needs after discharge will improve Outcome: Progressing   Problem: Education: Goal: Knowledge about tracheostomy care/management will improve Outcome: Progressing   Problem: Activity: Goal: Ability to tolerate increased activity will improve Outcome: Progressing   Problem: Health Behavior/Discharge Planning: Goal: Ability to manage tracheostomy will improve Outcome: Progressing   Problem: Respiratory: Goal: Patent airway maintenance will improve Outcome: Progressing   Problem: Role  Relationship: Goal: Ability to communicate will improve Outcome: Progressing   

## 2021-10-24 NOTE — Procedures (Signed)
Interventional Radiology Procedure Note  Procedure: Cholecystostomy tube exchange  Findings: Please refer to procedural dictation for full description. Occluded indwelling drain, successfully exchanged for new 10.2 Fr cholecystostomy. Approximately 100 mL purulent fluid aspirated from gallbladder.  Cystic duct remains occluded.  Complications: None immediate  Estimated Blood Loss: < 5 mL  Recommendations: Keep to bag drainge. IR will arrange for routine outpatient follow up in 8 weeks.   Ruthann Cancer, MD

## 2021-10-24 NOTE — Progress Notes (Signed)
Physical Therapy Treatment Patient Details Name: Troy Randall MRN: [id number] DOB: [birth year] Today's Date: 10/24/2021   History of Present Illness Patient is a 61 y/o male who presents on 09/04/21 with dark stools, dizziness and SOB. Found to have GI bleed and acute anemia s/p embolization 09/06/21 and percutaneous cholecystostomy 09/07/21. Intubated 9/3, CRRT 9/5-9/8. Hemorrhagic and septic shock 9/5. Trach placed 9/11. 9/16 MRI rapid development numerous round white matter lesions in both cerebral hemispheres; 9/17 MRI: numerous white matter lesions consistent with active demyelination - concerning for MS, ADEM, and cerebral vasculitis; 9/18 Lumbar puncture. Pt returned to ICU on 9/29 with aspiration PNA, tachypnea, tachycardia and increased WOB. 10/2 - pt transferred back to [facility]; started having GIB. Underwent sigmoidoscopy on 10/07/2021. Plasma exchange started. 10/17 trach capped. 10/19 decanulated. s/p Cholecystostomy tube exchange 10/24/21. PMH includes CVA, DM, HTN.    PT Comments    Patient progressing well towards PT goals. Session focused on gait training with use of RW today. Requires Min-Mod A of 2 for standing depending on surface height and able to ambulate in room with Mod A of 2 and chair follow for safety. Pt with left knee instability/buckling and narrow BoS putting pt at increased risk for falls. Pt continues to require multimodal cues to follow 1-2 step commands with repetition. More vocal today and crying tears of joy due to having walked for the first time, seeming to have improved insight into deficits/situation. Discharge recommendation updated to AIR due to improvement in progress/mobility. Will continue to follow.   Recommendations for follow up therapy are one component of a multi-disciplinary discharge planning process, led by the attending physician.  Recommendations may be updated based on patient status, additional functional criteria and insurance authorization.  Follow  Up Recommendations  Acute inpatient rehab (3hours/day)     Assistance Recommended at Discharge Frequent or constant Supervision/Assistance  Patient can return home with the following Two people to help with walking and/or transfers;Two people to help with bathing/dressing/bathroom;Assistance with cooking/housework;Assist for transportation;Help with stairs or ramp for entrance   Equipment Recommendations  Rolling walker (2 wheels)    Recommendations for Other Services Rehab consult     Precautions / Restrictions Precautions Precautions: Fall Precaution Comments: decannulated, cortrack, R drain Restrictions Weight Bearing Restrictions: No Other Position/Activity Restrictions: pt unable to sit on bottom for extended periods of time due to pain (even on geomat)     Mobility  Bed Mobility Overal bed mobility: Needs Assistance Bed Mobility: Rolling, Sidelying to Sit Rolling: Mod assist Sidelying to sit: Max assist, +2 for safety/equipment, +2 for physical assistance, HOB elevated       General bed mobility comments: Step by step cues to reach for rail, roll, bring LEs off bed and push up onto elbow, increased time and multimodal cues and increased time needed.    Transfers Overall transfer level: Needs assistance Equipment used: Rolling walker (2 wheels) Transfers: Sit to/from Stand Sit to Stand: +2 physical assistance, Mod assist, Min assist           General transfer comment: Min A of 2 to stand from EOB x1 with manual cues for hand placement and Mod A of 2 to stand from chair x2. Locking out BLEs in standing.    Ambulation/Gait Ambulation/Gait assistance: Mod assist, +2 safety/equipment, +2 physical assistance Gait Distance (Feet): 8 Feet (+10') Assistive device: Rolling walker (2 wheels) Gait Pattern/deviations: Step-through pattern, Narrow base of support, Knee hyperextension - left Gait velocity: decreased Gait velocity interpretation: <1.31 ft/sec, indicative  of  household ambulator   General Gait Details: Slow, unsteady gait with narrow BoS and left knee instability locking out Bil knees during stance phase to prevent buckling. 1 seated rest break. HR up to 136 bpm.   Stairs             Wheelchair Mobility    Modified Rankin (Stroke Patients Only)       Balance Overall balance assessment: Needs assistance Sitting-balance support: Feet supported, Single extremity supported Sitting balance-Leahy Scale: Fair Sitting balance - Comments: prefers UE support   Standing balance support: During functional activity, Bilateral upper extremity supported, Reliant on assistive device for balance Standing balance-Leahy Scale: Poor Standing balance comment: Requires UE support in standing. Able to place left hand on walker handle independently. Locks out bil knees                            Cognition Arousal/Alertness: Awake/alert Behavior During Therapy: Flat affect Overall Cognitive Status: Impaired/Different from baseline Area of Impairment: Following commands, Attention, Problem solving, Safety/judgement                   Current Attention Level: Focused   Following Commands: Follows one step commands with increased time, Follows one step commands inconsistently Safety/Judgement: Decreased awareness of deficits, Decreased awareness of safety   Problem Solving: Slow processing, Decreased initiation, Difficulty sequencing, Requires verbal cues, Requires tactile cues General Comments: Continues to require multimodal cues to follow 1-2 step commands with repetition, Poor attention. Smiling today and almost in tears due to having walked for the first time in 50 days, some improved insight.        Exercises      General Comments General comments (skin integrity, edema, etc.): Wife present and helpful. HR up to 126 bpm during activity but returns to baseline quickly- 90s bpm.      Pertinent Vitals/Pain Pain  Assessment Pain Assessment: Faces Faces Pain Scale: Hurts little more Pain Location: bottom/back Pain Descriptors / Indicators: Discomfort, Restless Pain Intervention(s): Monitored during session, Repositioned    Home Living                          Prior Function            PT Goals (current goals can now be found in the care plan section) Acute Rehab PT Goals Patient Stated Goal: to go to rehab per wife PT Goal Formulation: With patient/family Time For Goal Achievement: 11/07/21 Potential to Achieve Goals: Fair Progress towards PT goals: Progressing toward goals    Frequency    Min 3X/week      PT Plan Frequency needs to be updated;Discharge plan needs to be updated    Co-evaluation PT/OT/SLP Co-Evaluation/Treatment: Yes Reason for Co-Treatment: For patient/therapist safety;To address functional/ADL transfers;Necessary to address cognition/behavior during functional activity PT goals addressed during session: Mobility/safety with mobility;Balance;Strengthening/ROM;Proper use of DME        AM-PAC PT "6 Clicks" Mobility   Outcome Measure  Help needed turning from your back to your side while in a flat bed without using bedrails?: A Lot Help needed moving from lying on your back to sitting on the side of a flat bed without using bedrails?: A Lot Help needed moving to and from a bed to a chair (including a wheelchair)?: A Lot Help needed standing up from a chair using your arms (e.g., wheelchair or bedside chair)?: A Lot Help  needed to walk in hospital room?: Total Help needed climbing 3-5 steps with a railing? : Total 6 Click Score: 10    End of Session Equipment Utilized During Treatment: Gait belt Activity Tolerance: Patient tolerated treatment well Patient left: in bed;with call bell/phone within reach;with bed alarm set;with family/visitor present Nurse Communication: Mobility status PT Visit Diagnosis: Muscle weakness (generalized) (M62.81);Other  symptoms and signs involving the nervous system (R29.898);Pain Pain - part of body:  (bottom)     Time: 3235-5732 PT Time Calculation (min) (ACUTE ONLY): 39 min  Charges:  $Gait Training: 8-22 mins $Therapeutic Activity: 8-22 mins                     Marisa Severin, PT, DPT Acute Rehabilitation Services Secure chat preferred Office [place] 10/24/2021, 2:36 PM

## 2021-10-24 NOTE — Progress Notes (Signed)
Progress Note    Troy Randall  ION:[id number] DOB: 06/14/1960  DOA: 09/04/2021 PCP: [name], MD      Brief Narrative:    Medical records reviewed and are as summarized below:  Troy Randall is a 61 y.o. male with medical history of hypertension, hyperlipidemia, type 2 diabetes, CVA, presented to the hospital with GI bleed.  Patient underwent IR embolization followed by percutaneous cholecystostomy tube placement due to ongoing altered mental status.  MRI of the brain showed numerous contrast-enhancing bilateral white matter lesions concerning for demyelinating disease.  Neurology was consulted and underwent lumbar puncture on 09/22/2021 with low suspicion for infectious etiology.  Neurology has initiated the patient on high-dose steroids on 09/23/2021.  Patient was then transferred to hospitalist service on 09/24/2021.     Significant events. 8/31 presented to [facility] ED, [name] Admit, GI Consult 9/1 CTA> no evidence of active GI bleed, 2 U PRBC 9/2 2 U PRBC, colonoscopy > no active bleed found but large clots throughout entire colon. EGD> small hiatal hernia, erosive gastropathy with no stigmata of recent bleeding, and non-bleeding duodenal diverticulum.   Underwent then small bowel enteroscopy which noted intermittent blood pumping in the proximal jejunum but was unable to be reached despite multiple attempts, was injected and clip placed. Repeat CTA repeated, which showed positive jejunal diverticular bleeding. IR> active extrav at [facility] territory, jejunal arcade branch, successful coil embo 9/3 Intubated. CTA> no evidence of active GI bleeding.  New finding of abnormal gallbladder with nondependent air, gallbladder wall air, pericholecystic and right upper quadrant information.  IR placed percutaneous cholecystotomy tube.  9/4 R. IJ central line placed with dialysis triple lumen catheter 9/5 CRRT initiated 9/6 Bedside EGD without any evidence of bleeding. CTA a/p without etiology for  bleed 9/8 CRRT discontinued. Required ETT exchange yesterday due to blown cuff. 9/11 Bronchoscopic guided percutaneous tracheostomy placement.  Right IJ central line removed. 9/15 flexible sigmoidoscopy with removal of endoscopy 9/16 MRI brain w/o contrast amended study secondary to range of motion.  Rapid development numerous round white matter lesions in both cerebral hemispheres.  Prior MRIs. 9/17 MRI brain: Numerous contrast-enhancing bilateral white matter lesions, consistent with active demyelination. 9/18 Lumbar puncture  9/19 MRI cervical spine, thoracic : Limited exam, within limitations there is not definitive evidence of active demyelinization. Within limitations of motion artifact, there may be demyelinating lesions at the C6 and T1 vertebral body levels. 9/25-clinical exam not improved despite completion of 5-day IV steroids.  Neurology planning to repeat MRI of the brain cervical spine thoracic spine. Possible IR consult for catheter placement for possible Plex therapy if abnormal scans 9/26- scans attempted with ativan 9/29-patient transferred to ICU for respiratory distress due to aspiration pneumonia with sepsis 10/2-patient transferred back to [facility]; started having GI bleed, Dr. Mann/Dr. Benson Norway reconsulted.  Underwent sigmoidoscopy on 10/07/2021. 10/2: Plasma exchange starte       Assessment/Plan:   Principal Problem:   Acute metabolic encephalopathy Active Problems:   Uncontrolled type 2 diabetes mellitus with hyperglycemia, without long-term current use of insulin (HCC)   Essential hypertension   Hyperlipidemia   Chronic respiratory failure with hypoxia (HCC)   Tracheostomy status (HCC)   Anemia due to blood loss   History of lower GI bleeding: 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    H/O ischemic bowel disease   Acute emphysematous cholecystitis   Pressure injury of skin   Acute respiratory insufficiency   Nutrition Problem:  Increased nutrient  needs Etiology: acute illness  Signs/Symptoms: estimated needs   Body mass index is 37.49 kg/m.  (Obesity)   Acute metabolic encephalopathy -MRI brain showed bilateral white matter lesions concerning for active demyelinating disease -S/p IV steroids for 5 days with no significant improvement He underwent plasma exchange.  He had 5 doses and last dose was on 10/15/2021.  Last dose on 10/15/2021.Marland Kitchen  No further recommendations from neurology other than rehabilitation.   Hemorrhagic shock/GI bleed -Patient has had multiple endoscopic procedures last month including EGD/capsule endoscopy/coil embolization of jejunal branch of SMA. -Possibly from stress ulceration/ischemic disease as per GI: Status post sigmoidoscopy on 10/07/2021 which showed single solitary ulcer in the sigmoid colon which was biopsied and pathology shows chronic colitis. -Has had multiple packed red cell transfusion during this hospitalization. -Monitor H&H.  Hemoglobin has remained stable now between 7 and 8.  Transfuse packed red cells if hemoglobin is less than 7.   Acute respiratory failure with hypoxia status post tracheostomy Aspiration pneumonia -Status post tracheostomy placement on 09/15/2020.  PCCM following weekly. -Patient had acute aspiration event on 10/03/2021 and was placed on broad-spectrum antibiotics: Trach cultures grew MRSA and E. coli.  Treated with Rocephin and vancomycin and completed therapy.   Tracheostomy was successfully decannulated 10/23/2021.   E. coli cholecystitis status post percutaneous cholecystostomy drain placement -Had percutaneous cholecystostomy drain placement on 09/07/2021.  Subsequently had completed course of antibiotics. Cholecystostomy tube was exchanged by IR on 10/24/2021. -Will eventually need outpatient surgery/IR follow-up   Hypokalemia, hypophosphatemia, elevated liver enzymes, thrombocytopenia, AKI: Resolved. CCRT was discontinued on 09/12/2021.   Diabetes mellitus type 2  with hyperglycemia: Decrease Semglee from 20 units back to 15 units twice daily    Hypertension -Continue monitoring blood pressure.  Currently on doxazosin      Stage II medial sacral ulcer: Not present on admission -Continue local wound care.   Acute urinary retention -Foley catheter removed on 10/13/2021 but Foley catheter was reinserted on 10/14/2021. Continue doxazosin   Moderate protein calorie malnutrition/poor p.o. intake/dysphagia:  He is tolerating dysphagia 2 diet.  Discontinue Cortrak feeding tube.    Hypokalemia, hypophosphatemia, elevated liver enzymes, thrombocytopenia, AKI: Resolved. CCRT was discontinued on 09/12/2021.    Diet Order             DIET DYS 2 Room service appropriate? Yes; Fluid consistency: Nectar Thick  Diet effective now                            Consultants: Gastroenterologist, neurologist, intensivist nephrologist, general surgeon, interventional radiologist  Procedures: As noted above    Medications:    Chlorhexidine Gluconate Cloth  6 each Topical Daily   docusate sodium  100 mg Oral BID   doxazosin  2 mg Oral Daily   enoxaparin (LOVENOX) injection  40 mg Subcutaneous Q24H   feeding supplement (GLUCERNA SHAKE)  237 mL Oral TID BM   free water  250 mL Per Tube Q6H   influenza vac split quadrivalent PF  0.5 mL Intramuscular Tomorrow-1000   insulin aspart  0-15 Units Subcutaneous Q4H   insulin glargine-yfgn  20 Units Subcutaneous BID   lidocaine       mouth rinse  15 mL Mouth Rinse 4 times per day   pantoprazole  40 mg Oral BID   pneumococcal 23 valent vaccine  0.5 mL Intramuscular Tomorrow-1000   polyethylene glycol  17 g Oral Daily   rosuvastatin  40 mg  Oral Daily   sodium chloride flush  10-40 mL Intracatheter Q12H   sodium chloride flush  5 mL Intracatheter Q8H   Zinc Oxide   Topical BID   Continuous Infusions:  sodium chloride 10 mL/hr at 10/18/21 1524   sodium chloride 250 mL (10/11/21 [id number])   sodium  chloride Stopped (10/07/21 1454)   citrate dextrose     dextrose Stopped (10/04/21 1008)   lactated ringers       Anti-infectives (From admission, onward)    Start     Dose/Rate Route Frequency Ordered Stop   10/06/21 1515  cefTRIAXone (ROCEPHIN) 2 g in sodium chloride 0.9 % 100 mL IVPB        2 g 200 mL/hr over 30 Minutes Intravenous Every 24 hours 10/06/21 1420 10/09/21 1756   10/04/21 0945  metroNIDAZOLE (FLAGYL) IVPB 500 mg  Status:  Discontinued        500 mg 100 mL/hr over 60 Minutes Intravenous Every 12 hours 10/04/21 0853 10/06/21 1329   10/03/21 2200  vancomycin (VANCOREADY) IVPB 1500 mg/300 mL        1,500 mg 150 mL/hr over 120 Minutes Intravenous Every 12 hours 10/03/21 0613 10/10/21 0025   10/03/21 0700  vancomycin (VANCOREADY) IVPB 2000 mg/400 mL        2,000 mg 200 mL/hr over 120 Minutes Intravenous  Once 10/03/21 0613 10/03/21 1149   10/03/21 0700  ceFEPIme (MAXIPIME) 2 g in sodium chloride 0.9 % 100 mL IVPB  Status:  Discontinued        2 g 200 mL/hr over 30 Minutes Intravenous Every 8 hours 10/03/21 0613 10/06/21 1329   10/03/21 0645  vancomycin (VANCOREADY) IVPB 2000 mg/400 mL  Status:  Discontinued        2,000 mg 200 mL/hr over 120 Minutes Intravenous  Once 10/03/21 0549 10/03/21 0748   10/03/21 0645  ceFEPIme (MAXIPIME) 2 g in sodium chloride 0.9 % 100 mL IVPB  Status:  Discontinued        2 g 200 mL/hr over 30 Minutes Intravenous  Once 10/03/21 0549 10/03/21 1121   10/03/21 0600  vancomycin (VANCOREADY) IVPB 1500 mg/300 mL  Status:  Discontinued        1,500 mg 150 mL/hr over 120 Minutes Intravenous On call 10/02/21 1334 10/03/21 0549   09/11/21 1100  cefTRIAXone (ROCEPHIN) 2 g in sodium chloride 0.9 % 100 mL IVPB        2 g 200 mL/hr over 30 Minutes Intravenous Every 24 hours 09/11/21 1010 09/13/21 1108   09/08/21 0200  piperacillin-tazobactam (ZOSYN) IVPB 3.375 g  Status:  Discontinued        3.375 g 12.5 mL/hr over 240 Minutes Intravenous Every 8 hours  09/07/21 1856 09/07/21 2114   09/07/21 2200  meropenem (MERREM) 1 g in sodium chloride 0.9 % 100 mL IVPB  Status:  Discontinued        1 g 200 mL/hr over 30 Minutes Intravenous Every 8 hours 09/07/21 2114 09/11/21 1010   09/07/21 1945  piperacillin-tazobactam (ZOSYN) IVPB 3.375 g        3.375 g 100 mL/hr over 30 Minutes Intravenous  Once 09/07/21 1856 09/07/21 2005   09/07/21 1753  cefTRIAXone (ROCEPHIN) injection         Intravenous As needed 09/07/21 1754 09/07/21 1753   09/07/21 1749  sodium chloride 0.9 % with cefTRIAXone (ROCEPHIN) ADS Med       Note to Pharmacy: Eveline Keto E: cabinet override  09/07/21 1749 09/07/21 1935              Family Communication/Anticipated D/C date and plan/Code Status   DVT prophylaxis: enoxaparin (LOVENOX) injection 40 mg Start: 10/21/21 1415 Place and maintain sequential compression device Start: 10/07/21 2222 Place and maintain sequential compression device Start: 09/26/21 1759     Code Status: Full Code  Family Communication: Plan discussed with his wife at the bedside Disposition Plan: Plan to discharge to SNF when medically stable   Status is: Inpatient Remains inpatient appropriate because: Dysphagia, poor oral intake, awaiting placement to SNF       Subjective:   Interval events noted.  No complaints.  He had just walked in the room with the therapist.  His wife was at the bedside she said that he has been eating okay.  Objective:    Vitals:   10/23/21 1657 10/23/21 2115 10/24/21 0435 10/24/21 1008  BP:  124/69 119/75 132/80  Pulse: 87 (!) 103 85 96  Resp: 18 19 19 18   Temp:  98 F (36.7 C) 98.1 F (36.7 C) 97.6 F (36.4 C)  TempSrc:  Oral Oral Oral  SpO2: 93% 98% 96% 98%  Weight:      Height:       No data found.   Intake/Output Summary (Last 24 hours) at 10/24/2021 1404 Last data filed at 10/24/2021 0442 Gross per 24 hour  Intake --  Output 400 ml  Net -400 ml   Filed Weights   10/10/21 1048  10/15/21 1344 10/15/21 1625  Weight: 128.9 kg 125.4 kg 125.4 kg    Exam:  GEN: NAD SKIN: Warm and dry EYES: No pallor or icterus ENT: MMM, Cortrak feeding tube in place CV: RRR PULM: CTA B ABD: soft, ND, NT, +BS, + cholecystostomy tube with bloody drainage CNS: AAO x 1 (person), non focal EXT: No edema or tenderness GU: Foley catheter draining amber urine    Pressure Injury 10/06/21 Sacrum Medial Stage 2 -  Partial thickness loss of dermis presenting as a shallow open injury with a red, pink wound bed without slough. (Active)  10/06/21 2000  Location: Sacrum  Location Orientation: Medial  Staging: Stage 2 -  Partial thickness loss of dermis presenting as a shallow open injury with a red, pink wound bed without slough.  Wound Description (Comments):   Present on Admission:   Dressing Type Foam - Lift dressing to assess site every shift 10/23/21 2011     Data Reviewed:   I have personally reviewed following labs and imaging studies:  Labs: Labs show the following:   Basic Metabolic Panel: Recent Labs  Lab 10/20/21 0409 10/21/21 0416 10/22/21 0437 10/23/21 0458 10/24/21 0404  NA 141  --   --   --   --   K 3.6  --   --   --   --   CL 104  --   --   --   --   CO2 28  --   --   --   --   GLUCOSE 73  --   --   --   --   BUN 12  --   --   --   --   CREATININE 0.64  --   --   --   --   CALCIUM 9.6  --   --   --   --   MG 2.2 2.0 2.0 2.1 2.1  PHOS 5.3* 3.7 3.1 4.1 4.5   GFR Estimated  Creatinine Clearance: 136 mL/min (by C-G formula based on SCr of 0.64 mg/dL). Liver Function Tests: No results for input(s): "AST", "ALT", "ALKPHOS", "BILITOT", "PROT", "ALBUMIN" in the last 168 hours. No results for input(s): "LIPASE", "AMYLASE" in the last 168 hours. No results for input(s): "AMMONIA" in the last 168 hours. Coagulation profile No results for input(s): "INR", "PROTIME" in the last 168 hours.  CBC: Recent Labs  Lab 10/20/21 0409 10/21/21 0416 10/22/21 0437  10/23/21 0458 10/24/21 0404  WBC 4.9 5.4 6.5 5.5 5.7  NEUTROABS 2.6 3.1 4.2 3.3 3.2  HGB 8.8* 8.7* 8.4* 8.7* 8.8*  HCT 29.1* 27.8* 26.7* 28.5* 28.2*  MCV 93.6 93.3 91.8 91.9 91.0  PLT 275 265 249 259 271   Cardiac Enzymes: No results for input(s): "CKTOTAL", "CKMB", "CKMBINDEX", "TROPONINI" in the last 168 hours. BNP (last 3 results) No results for input(s): "PROBNP" in the last 8760 hours. CBG: Recent Labs  Lab 10/23/21 1621 10/23/21 2034 10/24/21 0429 10/24/21 1021 10/24/21 1150  GLUCAP 147* 140* 104* 102* 112*   D-Dimer: No results for input(s): "DDIMER" in the last 72 hours. Hgb A1c: No results for input(s): "HGBA1C" in the last 72 hours. Lipid Profile: No results for input(s): "CHOL", "HDL", "LDLCALC", "TRIG", "CHOLHDL", "LDLDIRECT" in the last 72 hours. Thyroid function studies: No results for input(s): "TSH", "T4TOTAL", "T3FREE", "THYROIDAB" in the last 72 hours.  Invalid input(s): "FREET3" Anemia work up: No results for input(s): "VITAMINB12", "FOLATE", "FERRITIN", "TIBC", "IRON", "RETICCTPCT" in the last 72 hours. Sepsis Labs: Recent Labs  Lab 10/21/21 0416 10/22/21 0437 10/23/21 0458 10/24/21 0404  WBC 5.4 6.5 5.5 5.7    Microbiology No results found for this or any previous visit (from the past 240 hour(s)).  Procedures and diagnostic studies:  IR Catheter Tube Change  Result Date: 10/24/2021 INDICATION: History of acute cholecystitis, post ultrasound fluoroscopic guided cholecystostomy tube placement on 09/08/2018. EXAM: FLUOROSCOPIC GUIDED CHOLECYSTOSTOMY TUBE EXCHANGE COMPARISON:  09/07/2021, 10/16/2021 MEDICATIONS: None ANESTHESIA/SEDATION: None CONTRAST:  5 mL Omnipaque 300-administered into the gallbladder lumen. FLUOROSCOPY TIME:  Forty-eight seconds, 11 mGy COMPLICATIONS: None immediate. PROCEDURE: The patient was positioned supine on the fluoroscopy table. The external portion of the existing cholecystostomy tube as well as the surrounding  skin was prepped and draped in usual sterile fashion. A time-out was performed prior to the initiation of the procedure. A preprocedural spot fluoroscopic image was obtained of the right upper abdominal quadrant existing cholecystostomy tube. The skin surrounding the cholecystostomy tube was anesthetized with 1% lidocaine with epinephrine. The external portion of the cholecystostomy tube was cut and cannulated with a short Amplatz wire which was advanced through the tube and coiled within the gallbladder lumen Next, under intermittent fluoroscopic guidance, the existing 10 French cholecystostomy tube was exchanged for a new 10 [place] cholecystostomy tube which was repositioned into the more central aspect of the gallbladder lumen. Contrast injection confirms appropriate positioning and functionality of the cholecystomy tube. The cholecystostomy tube was flushed with a small amount of saline and reconnected to a gravity bag. The cholecystostomy tube was secured with an interrupted suture and a Stat Lock device. A dressing was applied. The patient tolerated the procedure well without immediate postprocedural complication. FINDINGS: Preprocedural spot fluoroscopic image demonstrates unchanged positioning of cholecystostomy tube with end coiled and locked over the expected location of the fundus of the gallbladder After fluoroscopic guided exchange, the new 10 Fr cholecystostomy tube is positioned with end coiled and locked within the central aspect of the gallbladder lumen. Post exchange cholangiogram  demonstrates appropriate positioning and functionality of the new cholecystostomy tube. IMPRESSION: Successful fluoroscopic guided exchange of 10.2 Fr French cholecystostomy tube. PLAN: - The patient's cholecystostomy tube was reconnected to a gravity bag. - The patient may return to the interventional radiology drain clinic in 8 weeks for repeat diagnostic cholangiogram/exchange. Ruthann Cancer, MD [facility] Electronically Signed   By: Ruthann Cancer M.D.   On: 10/24/2021 10:08               LOS: 50 days   Kaylub Detienne  [facility]   Pager on [email or web address]. If 7PM-7AM, please contact night-coverage at [email or web address]     10/24/2021, 2:04 PM

## 2021-10-25 DIAGNOSIS — G9341 Metabolic encephalopathy: Secondary | ICD-10-CM | POA: Diagnosis not present

## 2021-10-25 LAB — CBC WITH DIFFERENTIAL/PLATELET
Abs Immature Granulocytes: 0.08 10*3/uL — ABNORMAL HIGH (ref 0.00–0.07)
Basophils Absolute: 0.1 10*3/uL (ref 0.0–0.1)
Basophils Relative: 1 %
Eosinophils Absolute: 0.1 10*3/uL (ref 0.0–0.5)
Eosinophils Relative: 2 %
HCT: 30.3 % — ABNORMAL LOW (ref 39.0–52.0)
Hemoglobin: 9.3 g/dL — ABNORMAL LOW (ref 13.0–17.0)
Immature Granulocytes: 1 %
Lymphocytes Relative: 23 %
Lymphs Abs: 1.5 10*3/uL (ref 0.7–4.0)
MCH: 28 pg (ref 26.0–34.0)
MCHC: 30.7 g/dL (ref 30.0–36.0)
MCV: 91.3 fL (ref 80.0–100.0)
Monocytes Absolute: 0.6 10*3/uL (ref 0.1–1.0)
Monocytes Relative: 9 %
Neutro Abs: 4.2 10*3/uL (ref 1.7–7.7)
Neutrophils Relative %: 64 %
Platelets: 295 10*3/uL (ref 150–400)
RBC: 3.32 MIL/uL — ABNORMAL LOW (ref 4.22–5.81)
RDW: 15.1 % (ref 11.5–15.5)
WBC: 6.5 10*3/uL (ref 4.0–10.5)
nRBC: 0 % (ref 0.0–0.2)

## 2021-10-25 LAB — GLUCOSE, CAPILLARY
Glucose-Capillary: 122 mg/dL — ABNORMAL HIGH (ref 70–99)
Glucose-Capillary: 114 mg/dL — ABNORMAL HIGH (ref 70–99)
Glucose-Capillary: 129 mg/dL — ABNORMAL HIGH (ref 70–99)
Glucose-Capillary: 157 mg/dL — ABNORMAL HIGH (ref 70–99)
Glucose-Capillary: 218 mg/dL — ABNORMAL HIGH (ref 70–99)
Glucose-Capillary: 241 mg/dL — ABNORMAL HIGH (ref 70–99)

## 2021-10-25 LAB — BASIC METABOLIC PANEL
Anion gap: 12 (ref 5–15)
BUN: 9 mg/dL (ref 6–20)
CO2: 24 mmol/L (ref 22–32)
Calcium: 9 mg/dL (ref 8.9–10.3)
Chloride: 104 mmol/L (ref 98–111)
Creatinine, Ser: 0.68 mg/dL (ref 0.61–1.24)
GFR, Estimated: >60 mL/min (ref >60)
Glucose, Bld: 115 mg/dL — ABNORMAL HIGH (ref 70–99)
Potassium: 3.6 mmol/L (ref 3.5–5.1)
Sodium: 140 mmol/L (ref 135–145)

## 2021-10-25 LAB — MAGNESIUM: Magnesium: 2 mg/dL (ref 1.7–2.4)

## 2021-10-25 LAB — PHOSPHORUS: Phosphorus: 4 mg/dL (ref 2.5–4.6)

## 2021-10-25 NOTE — Plan of Care (Signed)
  Problem: Education: Goal: Knowledge of General Education information will improve Description: Including pain rating scale, medication(s)/side effects and non-pharmacologic comfort measures Outcome: Progressing   Problem: Health Behavior/Discharge Planning: Goal: Ability to manage health-related needs will improve Outcome: Progressing   Problem: Clinical Measurements: Goal: Ability to maintain clinical measurements within normal limits will improve Outcome: Progressing Goal: Will remain free from infection Outcome: Progressing Goal: Diagnostic test results will improve Outcome: Progressing Goal: Respiratory complications will improve Outcome: Progressing Goal: Cardiovascular complication will be avoided Outcome: Progressing   Problem: Activity: Goal: Risk for activity intolerance will decrease Outcome: Progressing   Problem: Nutrition: Goal: Adequate nutrition will be maintained Outcome: Progressing   Problem: Coping: Goal: Level of anxiety will decrease Outcome: Progressing   Problem: Elimination: Goal: Will not experience complications related to bowel motility Outcome: Progressing Goal: Will not experience complications related to urinary retention Outcome: Progressing   Problem: Pain Managment: Goal: General experience of comfort will improve Outcome: Progressing   Problem: Safety: Goal: Ability to remain free from injury will improve Outcome: Progressing   Problem: Skin Integrity: Goal: Risk for impaired skin integrity will decrease Outcome: Progressing   Problem: Education: Goal: Ability to describe self-care measures that may prevent or decrease complications (Diabetes Survival Skills Education) will improve Outcome: Progressing Goal: Individualized Educational Video(s) Outcome: Progressing   Problem: Coping: Goal: Ability to adjust to condition or change in health will improve Outcome: Progressing   Problem: Fluid Volume: Goal: Ability to  maintain a balanced intake and output will improve Outcome: Progressing   Problem: Health Behavior/Discharge Planning: Goal: Ability to identify and utilize available resources and services will improve Outcome: Progressing Goal: Ability to manage health-related needs will improve Outcome: Progressing   Problem: Metabolic: Goal: Ability to maintain appropriate glucose levels will improve Outcome: Progressing   Problem: Nutritional: Goal: Maintenance of adequate nutrition will improve Outcome: Progressing Goal: Progress toward achieving an optimal weight will improve Outcome: Progressing   Problem: Skin Integrity: Goal: Risk for impaired skin integrity will decrease Outcome: Progressing   Problem: Tissue Perfusion: Goal: Adequacy of tissue perfusion will improve Outcome: Progressing   Problem: Education: Goal: Understanding of CV disease, CV risk reduction, and recovery process will improve Outcome: Progressing Goal: Individualized Educational Video(s) Outcome: Progressing   Problem: Activity: Goal: Ability to return to baseline activity level will improve Outcome: Progressing   Problem: Cardiovascular: Goal: Ability to achieve and maintain adequate cardiovascular perfusion will improve Outcome: Progressing Goal: Vascular access site(s) Level 0-1 will be maintained Outcome: Progressing   Problem: Health Behavior/Discharge Planning: Goal: Ability to safely manage health-related needs after discharge will improve Outcome: Progressing   Problem: Education: Goal: Knowledge about tracheostomy care/management will improve Outcome: Progressing   Problem: Activity: Goal: Ability to tolerate increased activity will improve Outcome: Progressing   Problem: Health Behavior/Discharge Planning: Goal: Ability to manage tracheostomy will improve Outcome: Progressing   Problem: Respiratory: Goal: Patent airway maintenance will improve Outcome: Progressing   Problem: Role  Relationship: Goal: Ability to communicate will improve Outcome: Progressing   

## 2021-10-25 NOTE — Progress Notes (Signed)
Progress Note    Troy Randall  [id number] DOB: 11/28/1960  DOA: 09/04/2021 PCP: [name], MD      Brief Narrative:    Medical records reviewed and are as summarized below:  Troy Randall is a 61 y.o. male with medical history of hypertension, hyperlipidemia, type 2 diabetes, CVA, presented to the hospital with GI bleed.  Patient underwent IR embolization followed by percutaneous cholecystostomy tube placement due to ongoing altered mental status.  MRI of the brain showed numerous contrast-enhancing bilateral white matter lesions concerning for demyelinating disease.  Neurology was consulted and underwent lumbar puncture on 09/22/2021 with low suspicion for infectious etiology.  Neurology has initiated the patient on high-dose steroids on 09/23/2021.  Patient was then transferred to hospitalist service on 09/24/2021.     Significant events. 8/31 presented to [facility] ED, [name] Admit, GI Consult 9/1 CTA> no evidence of active GI bleed, 2 U PRBC 9/2 2 U PRBC, colonoscopy > no active bleed found but large clots throughout entire colon. EGD> small hiatal hernia, erosive gastropathy with no stigmata of recent bleeding, and non-bleeding duodenal diverticulum.   Underwent then small bowel enteroscopy which noted intermittent blood pumping in the proximal jejunum but was unable to be reached despite multiple attempts, was injected and clip placed. Repeat CTA repeated, which showed positive jejunal diverticular bleeding. IR> active extrav at [facility] territory, jejunal arcade branch, successful coil embo 9/3 Intubated. CTA> no evidence of active GI bleeding.  New finding of abnormal gallbladder with nondependent air, gallbladder wall air, pericholecystic and right upper quadrant information.  IR placed percutaneous cholecystotomy tube.  9/4 R. IJ central line placed with dialysis triple lumen catheter 9/5 CRRT initiated 9/6 Bedside EGD without any evidence of bleeding. CTA a/p without etiology for  bleed 9/8 CRRT discontinued. Required ETT exchange yesterday due to blown cuff. 9/11 Bronchoscopic guided percutaneous tracheostomy placement.  Right IJ central line removed. 9/15 flexible sigmoidoscopy with removal of endoscopy 9/16 MRI brain w/o contrast amended study secondary to range of motion.  Rapid development numerous round white matter lesions in both cerebral hemispheres.  Prior MRIs. 9/17 MRI brain: Numerous contrast-enhancing bilateral white matter lesions, consistent with active demyelination. 9/18 Lumbar puncture  9/19 MRI cervical spine, thoracic : Limited exam, within limitations there is not definitive evidence of active demyelinization. Within limitations of motion artifact, there may be demyelinating lesions at the C6 and T1 vertebral body levels. 9/25-clinical exam not improved despite completion of 5-day IV steroids.  Neurology planning to repeat MRI of the brain cervical spine thoracic spine. Possible IR consult for catheter placement for possible Plex therapy if abnormal scans 9/26- scans attempted with ativan 9/29-patient transferred to ICU for respiratory distress due to aspiration pneumonia with sepsis 10/2-patient transferred back to [facility]; started having GI bleed, Dr. Mann/Dr. Benson [place] reconsulted.  Underwent sigmoidoscopy on 10/07/2021. 10/2: Plasma exchange starte       Assessment/Plan:   Principal Problem:   Acute metabolic encephalopathy Active Problems:   Uncontrolled type 2 diabetes mellitus with hyperglycemia, without long-term current use of insulin (HCC)   Essential hypertension   Hyperlipidemia   Chronic respiratory failure with hypoxia (HCC)   Tracheostomy status (HCC)   Anemia due to blood loss   History of lower GI bleeding: 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    H/O ischemic bowel disease   Acute emphysematous cholecystitis   Pressure injury of skin   Acute respiratory insufficiency   obesity Body  mass index is 37.49 kg/m.     Acute metabolic encephalopathy -MRI brain showed bilateral white matter lesions concerning for active demyelinating disease -S/p IV steroids for 5 days with no significant improvement He underwent plasma exchange.  He had 5 doses and last dose was on 10/15/2021.  Last dose on 10/15/2021.Marland Kitchen  No further recommendations from neurology other than rehabilitation.   Hemorrhagic shock/GI bleed -Patient has had multiple endoscopic procedures last month including EGD/capsule endoscopy/coil embolization of jejunal branch of SMA. -Possibly from stress ulceration/ischemic disease as per GI: Status post sigmoidoscopy on 10/07/2021 which showed single solitary ulcer in the sigmoid colon which was biopsied and pathology shows chronic colitis. -Has had multiple packed red cell transfusion during this hospitalization. -Monitor H&H.  Hemoglobin has remained stable now between 7 and 8.  Transfuse packed red cells if hemoglobin is less than 7.   Acute respiratory failure with hypoxia status post tracheostomy Aspiration pneumonia -Status post tracheostomy placement on 09/15/2020.  PCCM following weekly. -Patient had acute aspiration event on 10/03/2021 and was placed on broad-spectrum antibiotics: Trach cultures grew MRSA and E. coli.  Treated with Rocephin and vancomycin and completed therapy.   Tracheostomy was successfully decannulated 10/23/2021.   E. coli cholecystitis status post percutaneous cholecystostomy drain placement -Had percutaneous cholecystostomy drain placement on 09/07/2021.  Subsequently had completed course of antibiotics. Cholecystostomy tube was exchanged by IR on 10/24/2021. -Will eventually need outpatient surgery/IR follow-up   Hypokalemia, hypophosphatemia, elevated liver enzymes, thrombocytopenia, AKI: Resolved. CCRT was discontinued on 09/12/2021.   Diabetes mellitus type 2 with hyperglycemia: Decrease Semglee from 20 units back to 15 units twice daily    Hypertension -Continue  monitoring blood pressure.  Currently on doxazosin      Stage II medial sacral ulcer: Not present on admission -Continue local wound care.   Acute urinary retention -Foley catheter removed on 10/13/2021 but Foley catheter was reinserted on 10/14/2021. Continue doxazosin   Moderate protein calorie malnutrition/poor p.o. intake/dysphagia:  He is tolerating dysphagia 2 diet.  Discontinue Cortrak feeding tube.    Hypokalemia, hypophosphatemia, elevated liver enzymes, thrombocytopenia, AKI: Resolved. CCRT was discontinued on 09/12/2021.    Diet Order             DIET DYS 2 Room service appropriate? Yes; Fluid consistency: Nectar Thick  Diet effective now                            Consultants: Gastroenterologist, neurologist, intensivist nephrologist, general surgeon, interventional radiologist  Procedures: As noted above    Medications:    Chlorhexidine Gluconate Cloth  6 each Topical Daily   docusate sodium  100 mg Oral BID   doxazosin  2 mg Oral Daily   enoxaparin (LOVENOX) injection  40 mg Subcutaneous Q24H   feeding supplement (GLUCERNA SHAKE)  237 mL Oral TID BM   free water  250 mL Per Tube Q6H   influenza vac split quadrivalent PF  0.5 mL Intramuscular Tomorrow-1000   insulin aspart  0-15 Units Subcutaneous Q4H   insulin glargine-yfgn  15 Units Subcutaneous BID   mouth rinse  15 mL Mouth Rinse 4 times per day   pantoprazole  40 mg Oral BID   pneumococcal 23 valent vaccine  0.5 mL Intramuscular Tomorrow-1000   polyethylene glycol  17 g Oral Daily   rosuvastatin  40 mg Oral Daily   sodium chloride flush  10-40 mL Intracatheter Q12H   sodium chloride flush  5 mL Intracatheter  Q8H   Zinc Oxide   Topical BID   Continuous Infusions:  sodium chloride 10 mL/hr at 10/18/21 1524   sodium chloride 250 mL (10/11/21 4098)   sodium chloride Stopped (10/07/21 1454)   citrate dextrose     dextrose Stopped (10/04/21 1008)   lactated ringers                  Family Communication/Anticipated D/C date and plan/Code Status   DVT prophylaxis: enoxaparin (LOVENOX) injection 40 mg Start: 10/21/21 1415 Place and maintain sequential compression device Start: 10/07/21 2222 Place and maintain sequential compression device Start: 09/26/21 1759     Code Status: Full Code  Family Communication: Plan discussed with his wife at the bedside Disposition Plan: Plan to discharge to SNF/cir when medically stable          Subjective:   Feels like he is slowly getting better RN notified patient having urine leaking around foley   Objective:    Vitals:   10/24/21 1948 10/24/21 2348 10/25/21 0518 10/25/21 0831  BP: 134/75 133/73 129/78 123/83  Pulse:    (!) 103  Resp: 20 20 18 20   Temp:   97.7 F (36.5 C) (!) 97.5 F (36.4 C)  TempSrc:   Oral Oral  SpO2: 95% 96% 95%   Weight:      Height:       No data found.   Intake/Output Summary (Last 24 hours) at 10/25/2021 1225 Last data filed at 10/25/2021 0900 Gross per 24 hour  Intake 60 ml  Output 715 ml  Net -655 ml   Filed Weights   10/10/21 1048 10/15/21 1344 10/15/21 1625  Weight: 128.9 kg 125.4 kg 125.4 kg    Exam:   General: Appearance:    Obese male in no acute distress     Lungs:      respirations unlabored  Heart:    Tachycardic.   MS:   All extremities are intact.   Neurologic:   Awake, alert       Pressure Injury 10/06/21 Sacrum Medial Stage 2 -  Partial thickness loss of dermis presenting as a shallow open injury with a red, pink wound bed without slough. (Active)  10/06/21 2000  Location: Sacrum  Location Orientation: Medial  Staging: Stage 2 -  Partial thickness loss of dermis presenting as a shallow open injury with a red, pink wound bed without slough.  Wound Description (Comments):   Present on Admission:   Dressing Type Foam - Lift dressing to assess site every shift 10/23/21 2011     Data Reviewed:   I have personally  reviewed following labs and imaging studies:  Labs: Labs show the following:   Basic Metabolic Panel: Recent Labs  Lab 10/20/21 0409 10/21/21 0416 10/22/21 0437 10/23/21 0458 10/24/21 0404 10/25/21 0300  NA 141  --   --   --   --  140  K 3.6  --   --   --   --  3.6  CL 104  --   --   --   --  104  CO2 28  --   --   --   --  24  GLUCOSE 73  --   --   --   --  115*  BUN 12  --   --   --   --  9  CREATININE 0.64  --   --   --   --  0.68  CALCIUM 9.6  --   --   --   --  9.0  MG 2.2 2.0 2.0 2.1 2.1 2.0  PHOS 5.3* 3.7 3.1 4.1 4.5 4.0   GFR Estimated Creatinine Clearance: 136 mL/min (by C-G formula based on SCr of 0.68 mg/dL). Liver Function Tests: No results for input(s): "AST", "ALT", "ALKPHOS", "BILITOT", "PROT", "ALBUMIN" in the last 168 hours. No results for input(s): "LIPASE", "AMYLASE" in the last 168 hours. No results for input(s): "AMMONIA" in the last 168 hours. Coagulation profile No results for input(s): "INR", "PROTIME" in the last 168 hours.  CBC: Recent Labs  Lab 10/21/21 0416 10/22/21 0437 10/23/21 0458 10/24/21 0404 10/25/21 0300  WBC 5.4 6.5 5.5 5.7 6.5  NEUTROABS 3.1 4.2 3.3 3.2 4.2  HGB 8.7* 8.4* 8.7* 8.8* 9.3*  HCT 27.8* 26.7* 28.5* 28.2* 30.3*  MCV 93.3 91.8 91.9 91.0 91.3  PLT 265 249 259 271 295   Cardiac Enzymes: No results for input(s): "CKTOTAL", "CKMB", "CKMBINDEX", "TROPONINI" in the last 168 hours. BNP (last 3 results) No results for input(s): "PROBNP" in the last 8760 hours. CBG: Recent Labs  Lab 10/24/21 1628 10/24/21 1946 10/25/21 0134 10/25/21 0521 10/25/21 0858  GLUCAP 174* 139* 122* 114* 129*   D-Dimer: No results for input(s): "DDIMER" in the last 72 hours. Hgb A1c: No results for input(s): "HGBA1C" in the last 72 hours. Lipid Profile: No results for input(s): "CHOL", "HDL", "LDLCALC", "TRIG", "CHOLHDL", "LDLDIRECT" in the last 72 hours. Thyroid function studies: No results for input(s): "TSH", "T4TOTAL", "T3FREE",  "THYROIDAB" in the last 72 hours.  Invalid input(s): "FREET3" Anemia work up: No results for input(s): "VITAMINB12", "FOLATE", "FERRITIN", "TIBC", "IRON", "RETICCTPCT" in the last 72 hours. Sepsis Labs: Recent Labs  Lab 10/22/21 0437 10/23/21 0458 10/24/21 0404 10/25/21 0300  WBC 6.5 5.5 5.7 6.5    Microbiology No results found for this or any previous visit (from the past 240 hour(s)).  Procedures and diagnostic studies:  IR Catheter Tube Change  Result Date: 10/24/2021 INDICATION: History of acute cholecystitis, post ultrasound fluoroscopic guided cholecystostomy tube placement on 09/08/2018. EXAM: FLUOROSCOPIC GUIDED CHOLECYSTOSTOMY TUBE EXCHANGE COMPARISON:  09/07/2021, 10/16/2021 MEDICATIONS: None ANESTHESIA/SEDATION: None CONTRAST:  5 mL Omnipaque 300-administered into the gallbladder lumen. FLUOROSCOPY TIME:  Forty-eight seconds, 11 mGy COMPLICATIONS: None immediate. PROCEDURE: The patient was positioned supine on the fluoroscopy table. The external portion of the existing cholecystostomy tube as well as the surrounding skin was prepped and draped in usual sterile fashion. A time-out was performed prior to the initiation of the procedure. A preprocedural spot fluoroscopic image was obtained of the right upper abdominal quadrant existing cholecystostomy tube. The skin surrounding the cholecystostomy tube was anesthetized with 1% lidocaine with epinephrine. The external portion of the cholecystostomy tube was cut and cannulated with a short Amplatz wire which was advanced through the tube and coiled within the gallbladder lumen Next, under intermittent fluoroscopic guidance, the existing 10 French cholecystostomy tube was exchanged for a new 10 [place] cholecystostomy tube which was repositioned into the more central aspect of the gallbladder lumen. Contrast injection confirms appropriate positioning and functionality of the cholecystomy tube. The cholecystostomy tube was flushed with a  small amount of saline and reconnected to a gravity bag. The cholecystostomy tube was secured with an interrupted suture and a Stat Lock device. A dressing was applied. The patient tolerated the procedure well without immediate postprocedural complication. FINDINGS: Preprocedural spot fluoroscopic image demonstrates unchanged positioning of cholecystostomy tube with end coiled and locked over the expected location of the fundus of the gallbladder After fluoroscopic guided exchange, the new 10  Fr cholecystostomy tube is positioned with end coiled and locked within the central aspect of the gallbladder lumen. Post exchange cholangiogram demonstrates appropriate positioning and functionality of the new cholecystostomy tube. IMPRESSION: Successful fluoroscopic guided exchange of 10.2 Fr French cholecystostomy tube. PLAN: - The patient's cholecystostomy tube was reconnected to a gravity bag. - The patient may return to the interventional radiology drain clinic in 8 weeks for repeat diagnostic cholangiogram/exchange. Ruthann Cancer, MD [facility] Radiology Electronically Signed   By: Ruthann Cancer M.D.   On: 10/24/2021 10:08               LOS: 25 days   Geradine Girt  [facility]   Pager on [email or web address]. If 7PM-7AM, please contact night-coverage at [email or web address]     10/25/2021, 12:25 PM

## 2021-10-26 DIAGNOSIS — G9341 Metabolic encephalopathy: Secondary | ICD-10-CM | POA: Diagnosis not present

## 2021-10-26 LAB — MAGNESIUM: Magnesium: 2 mg/dL (ref 1.7–2.4)

## 2021-10-26 LAB — GLUCOSE, CAPILLARY
Glucose-Capillary: 114 mg/dL — ABNORMAL HIGH (ref 70–99)
Glucose-Capillary: 146 mg/dL — ABNORMAL HIGH (ref 70–99)
Glucose-Capillary: 156 mg/dL — ABNORMAL HIGH (ref 70–99)
Glucose-Capillary: 166 mg/dL — ABNORMAL HIGH (ref 70–99)
Glucose-Capillary: 190 mg/dL — ABNORMAL HIGH (ref 70–99)
Glucose-Capillary: 211 mg/dL — ABNORMAL HIGH (ref 70–99)

## 2021-10-26 LAB — PHOSPHORUS: Phosphorus: 3.8 mg/dL (ref 2.5–4.6)

## 2021-10-26 MED ORDER — LIDOCAINE 5 % EX PTCH
1.0000 | MEDICATED_PATCH | CUTANEOUS | Status: DC
  Administered 2021-10-26 – 2021-10-30 (×5): 1 via TRANSDERMAL
  Filled 2021-10-26 (×5): qty 1

## 2021-10-26 MED ORDER — PANTOPRAZOLE 2 MG/ML SUSPENSION
40.0000 mg | Freq: Every day | ORAL | Status: DC
  Administered 2021-10-26 – 2021-10-30 (×5): 40 mg via ORAL
  Filled 2021-10-26 (×5): qty 20

## 2021-10-26 NOTE — Progress Notes (Signed)
Progress Note    Troy Randall  AOZ:[id number] DOB: March 17, 1960  DOA: 09/04/2021 PCP: [name], MD      Brief Narrative:    Medical records reviewed and are as summarized below:  Troy Randall is a 61 y.o. male with medical history of hypertension, hyperlipidemia, type 2 diabetes, CVA, presented to the hospital with GI bleed.  Patient underwent IR embolization followed by percutaneous cholecystostomy tube placement due to ongoing altered mental status.  MRI of the brain showed numerous contrast-enhancing bilateral white matter lesions concerning for demyelinating disease.  Neurology was consulted and underwent lumbar puncture on 09/22/2021 with low suspicion for infectious etiology.  Neurology has initiated the patient on high-dose steroids on 09/23/2021.  Patient was then transferred to hospitalist service on 09/24/2021.     Significant events. 8/31 presented to [facility] ED, [name] Admit, GI Consult 9/1 CTA> no evidence of active GI bleed, 2 U PRBC 9/2 2 U PRBC, colonoscopy > no active bleed found but large clots throughout entire colon. EGD> small hiatal hernia, erosive gastropathy with no stigmata of recent bleeding, and non-bleeding duodenal diverticulum.   Underwent then small bowel enteroscopy which noted intermittent blood pumping in the proximal jejunum but was unable to be reached despite multiple attempts, was injected and clip placed. Repeat CTA repeated, which showed positive jejunal diverticular bleeding. IR> active extrav at [facility] territory, jejunal arcade branch, successful coil embo 9/3 Intubated. CTA> no evidence of active GI bleeding.  New finding of abnormal gallbladder with nondependent air, gallbladder wall air, pericholecystic and right upper quadrant information.  IR placed percutaneous cholecystotomy tube.  9/4 R. IJ central line placed with dialysis triple lumen catheter 9/5 CRRT initiated 9/6 Bedside EGD without any evidence of bleeding. CTA a/p without etiology for  bleed 9/8 CRRT discontinued. Required ETT exchange yesterday due to blown cuff. 9/11 Bronchoscopic guided percutaneous tracheostomy placement.  Right IJ central line removed. 9/15 flexible sigmoidoscopy with removal of endoscopy 9/16 MRI brain w/o contrast amended study secondary to range of motion.  Rapid development numerous round white matter lesions in both cerebral hemispheres.  Prior MRIs. 9/17 MRI brain: Numerous contrast-enhancing bilateral white matter lesions, consistent with active demyelination. 9/18 Lumbar puncture  9/19 MRI cervical spine, thoracic : Limited exam, within limitations there is not definitive evidence of active demyelinization. Within limitations of motion artifact, there may be demyelinating lesions at the C6 and T1 vertebral body levels. 9/25-clinical exam not improved despite completion of 5-day IV steroids.  Neurology planning to repeat MRI of the brain cervical spine thoracic spine. Possible IR consult for catheter placement for possible Plex therapy if abnormal scans 9/26- scans attempted with ativan 9/29-patient transferred to ICU for respiratory distress due to aspiration pneumonia with sepsis 10/2-patient transferred back to [facility]; started having GI bleed, Dr. Mann/Dr. Benson Norway reconsulted.  Underwent sigmoidoscopy on 10/07/2021. 10/2: Plasma exchange started       Assessment/Plan:   Principal Problem:   Acute metabolic encephalopathy Active Problems:   Uncontrolled type 2 diabetes mellitus with hyperglycemia, without long-term current use of insulin (HCC)   Essential hypertension   Hyperlipidemia   Chronic respiratory failure with hypoxia (HCC)   Tracheostomy status (HCC)   Anemia due to blood loss   History of lower GI bleeding: 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    H/O ischemic bowel disease   Acute emphysematous cholecystitis   Pressure injury of skin   Acute respiratory insufficiency   obesity Body  mass index is 37.49 kg/m.     Acute metabolic encephalopathy -MRI brain showed bilateral white matter lesions concerning for active demyelinating disease -S/p IV steroids for 5 days with no significant improvement He underwent plasma exchange.  He had 5 doses and last dose was on 10/15/2021.  Last dose on 10/15/2021.Marland Kitchen  No further recommendations from neurology other than rehabilitation.   Hemorrhagic shock/GI bleed -Patient has had multiple endoscopic procedures last month including EGD/capsule endoscopy/coil embolization of jejunal branch of SMA. -Possibly from stress ulceration/ischemic disease as per GI: Status post sigmoidoscopy on 10/07/2021 which showed single solitary ulcer in the sigmoid colon which was biopsied and pathology shows chronic colitis. -Has had multiple packed red cell transfusion during this hospitalization. -Monitor H&H.  Hemoglobin has remained stable now between 7 and 8.  Transfuse packed red cells if hemoglobin is less than 7.   Acute respiratory failure with hypoxia status post tracheostomy Aspiration pneumonia -Status post tracheostomy placement on 09/15/2020.  PCCM following weekly. -Patient had acute aspiration event on 10/03/2021 and was placed on broad-spectrum antibiotics: Trach cultures grew MRSA and E. coli.  Treated with Rocephin and vancomycin and completed therapy.   Tracheostomy was successfully decannulated 10/23/2021.   E. coli cholecystitis status post percutaneous cholecystostomy drain placement -Had percutaneous cholecystostomy drain placement on 09/07/2021.  Subsequently had completed course of antibiotics. Cholecystostomy tube was exchanged by IR on 10/24/2021. -Will eventually need outpatient surgery/IR follow-up   Hypokalemia, hypophosphatemia, elevated liver enzymes, thrombocytopenia, AKI: Resolved. CCRT was discontinued on 09/12/2021.   Diabetes mellitus type 2 with hyperglycemia: Decrease Semglee from 20 units back to 15 units twice daily    Hypertension -Continue  monitoring blood pressure.  Currently on doxazosin      Stage II medial sacral ulcer: Not present on admission -Continue local wound care.   Acute urinary retention -Foley catheter removed on 10/13/2021 but Foley catheter was reinserted on 10/14/2021. Continue doxazosin   Moderate protein calorie malnutrition/poor p.o. intake/dysphagia:  He is tolerating dysphagia 2 diet.  Discontinue Cortrak feeding tube.    Hypokalemia, hypophosphatemia, elevated liver enzymes, thrombocytopenia, AKI: Resolved. CCRT was discontinued on 09/12/2021.    Diet Order             DIET DYS 2 Room service appropriate? Yes; Fluid consistency: Nectar Thick  Diet effective now                      Consultants: Gastroenterologist, neurologist, intensivist nephrologist, general surgeon, interventional radiologist      Medications:    Chlorhexidine Gluconate Cloth  6 each Topical Daily   docusate sodium  100 mg Oral BID   doxazosin  2 mg Oral Daily   enoxaparin (LOVENOX) injection  40 mg Subcutaneous Q24H   feeding supplement (GLUCERNA SHAKE)  237 mL Oral TID BM   free water  250 mL Per Tube Q6H   influenza vac split quadrivalent PF  0.5 mL Intramuscular Tomorrow-1000   insulin aspart  0-15 Units Subcutaneous Q4H   insulin glargine-yfgn  15 Units Subcutaneous BID   lidocaine  1 patch Transdermal Q24H   mouth rinse  15 mL Mouth Rinse 4 times per day   pantoprazole  40 mg Oral Daily   pneumococcal 23 valent vaccine  0.5 mL Intramuscular Tomorrow-1000   polyethylene glycol  17 g Oral Daily   rosuvastatin  40 mg Oral Daily   sodium chloride flush  10-40 mL Intracatheter Q12H   Zinc Oxide   Topical BID  Continuous Infusions:  sodium chloride Stopped (10/07/21 1454)   citrate dextrose        Family Communication/Anticipated D/C date and plan/Code Status   DVT prophylaxis: enoxaparin (LOVENOX) injection 40 mg Start: 10/21/21 1415 Place and maintain sequential compression device Start:  10/07/21 2222 Place and maintain sequential compression device Start: 09/26/21 1759     Code Status: Full Code  Family Communication: Plan discussed with his wife at the bedside Disposition Plan: Plan to discharge to SNF/cir when medically stable          Subjective:   C/o back pain   Objective:    Vitals:   10/25/21 0518 10/25/21 0831 10/26/21 0358 10/26/21 0906  BP: 129/78 123/83 130/76 123/83  Pulse:  (!) 103 93 97  Resp: 18 20 20 20   Temp: 97.7 F (36.5 C) (!) 97.5 F (36.4 C) 98.1 F (36.7 C) 98.2 F (36.8 C)  TempSrc: Oral Oral Oral Oral  SpO2: 95%  96%   Weight:      Height:       No data found.   Intake/Output Summary (Last 24 hours) at 10/26/2021 1130 Last data filed at 10/26/2021 0600 Gross per 24 hour  Intake 210 ml  Output 500 ml  Net -290 ml   Filed Weights   10/10/21 1048 10/15/21 1344 10/15/21 1625  Weight: 128.9 kg 125.4 kg 125.4 kg    Exam:  General: Appearance:    Obese male in no acute distress     Lungs:     respirations unlabored  Heart:    Normal heart rate.   MS:   All extremities are intact.   Neurologic:   Awake, alert       Pressure Injury 10/06/21 Sacrum Medial Stage 2 -  Partial thickness loss of dermis presenting as a shallow open injury with a red, pink wound bed without slough. (Active)  10/06/21 2000  Location: Sacrum  Location Orientation: Medial  Staging: Stage 2 -  Partial thickness loss of dermis presenting as a shallow open injury with a red, pink wound bed without slough.  Wound Description (Comments):   Present on Admission:   Dressing Type Foam - Lift dressing to assess site every shift 10/23/21 2011     Data Reviewed:   I have personally reviewed following labs and imaging studies:  Labs: Labs show the following:   Basic Metabolic Panel: Recent Labs  Lab 10/20/21 0409 10/21/21 0416 10/22/21 0437 10/23/21 0458 10/24/21 0404 10/25/21 0300 10/26/21 0421  NA 141  --   --   --   --  140   --   K 3.6  --   --   --   --  3.6  --   CL 104  --   --   --   --  104  --   CO2 28  --   --   --   --  24  --   GLUCOSE 73  --   --   --   --  115*  --   BUN 12  --   --   --   --  9  --   CREATININE 0.64  --   --   --   --  0.68  --   CALCIUM 9.6  --   --   --   --  9.0  --   MG 2.2   < > 2.0 2.1 2.1 2.0 2.0  PHOS 5.3*   < >  3.1 4.1 4.5 4.0 3.8   < > = values in this interval not displayed.   GFR Estimated Creatinine Clearance: 136 mL/min (by C-G formula based on SCr of 0.68 mg/dL). Liver Function Tests: No results for input(s): "AST", "ALT", "ALKPHOS", "BILITOT", "PROT", "ALBUMIN" in the last 168 hours. No results for input(s): "LIPASE", "AMYLASE" in the last 168 hours. No results for input(s): "AMMONIA" in the last 168 hours. Coagulation profile No results for input(s): "INR", "PROTIME" in the last 168 hours.  CBC: Recent Labs  Lab 10/21/21 0416 10/22/21 0437 10/23/21 0458 10/24/21 0404 10/25/21 0300  WBC 5.4 6.5 5.5 5.7 6.5  NEUTROABS 3.1 4.2 3.3 3.2 4.2  HGB 8.7* 8.4* 8.7* 8.8* 9.3*  HCT 27.8* 26.7* 28.5* 28.2* 30.3*  MCV 93.3 91.8 91.9 91.0 91.3  PLT 265 249 259 271 295   Cardiac Enzymes: No results for input(s): "CKTOTAL", "CKMB", "CKMBINDEX", "TROPONINI" in the last 168 hours. BNP (last 3 results) No results for input(s): "PROBNP" in the last 8760 hours. CBG: Recent Labs  Lab 10/25/21 2003 10/26/21 0042 10/26/21 0401 10/26/21 0814 10/26/21 1123  GLUCAP 241* 156* 114* 146* 166*   D-Dimer: No results for input(s): "DDIMER" in the last 72 hours. Hgb A1c: No results for input(s): "HGBA1C" in the last 72 hours. Lipid Profile: No results for input(s): "CHOL", "HDL", "LDLCALC", "TRIG", "CHOLHDL", "LDLDIRECT" in the last 72 hours. Thyroid function studies: No results for input(s): "TSH", "T4TOTAL", "T3FREE", "THYROIDAB" in the last 72 hours.  Invalid input(s): "FREET3" Anemia work up: No results for input(s): "VITAMINB12", "FOLATE", "FERRITIN", "TIBC",  "IRON", "RETICCTPCT" in the last 72 hours. Sepsis Labs: Recent Labs  Lab 10/22/21 0437 10/23/21 0458 10/24/21 0404 10/25/21 0300  WBC 6.5 5.5 5.7 6.5    Microbiology No results found for this or any previous visit (from the past 240 hour(s)).  Procedures and diagnostic studies:  No results found.      LOS: 93 days   Geradine Girt  [facility]   Pager on [email or web address]. If 7PM-7AM, please contact night-coverage at [email or web address]     10/26/2021, 11:30 AM

## 2021-10-27 DIAGNOSIS — G9341 Metabolic encephalopathy: Secondary | ICD-10-CM | POA: Diagnosis not present

## 2021-10-27 LAB — BASIC METABOLIC PANEL
Anion gap: 7 (ref 5–15)
BUN: 11 mg/dL (ref 6–20)
CO2: 27 mmol/L (ref 22–32)
Calcium: 8.9 mg/dL (ref 8.9–10.3)
Chloride: 109 mmol/L (ref 98–111)
Creatinine, Ser: 0.8 mg/dL (ref 0.61–1.24)
GFR, Estimated: >60 mL/min (ref >60)
Glucose, Bld: 130 mg/dL — ABNORMAL HIGH (ref 70–99)
Potassium: 3.8 mmol/L (ref 3.5–5.1)
Sodium: 143 mmol/L (ref 135–145)

## 2021-10-27 LAB — GLUCOSE, CAPILLARY
Glucose-Capillary: 110 mg/dL — ABNORMAL HIGH (ref 70–99)
Glucose-Capillary: 112 mg/dL — ABNORMAL HIGH (ref 70–99)
Glucose-Capillary: 141 mg/dL — ABNORMAL HIGH (ref 70–99)
Glucose-Capillary: 158 mg/dL — ABNORMAL HIGH (ref 70–99)
Glucose-Capillary: 184 mg/dL — ABNORMAL HIGH (ref 70–99)
Glucose-Capillary: 218 mg/dL — ABNORMAL HIGH (ref 70–99)
Glucose-Capillary: 220 mg/dL — ABNORMAL HIGH (ref 70–99)
Glucose-Capillary: 58 mg/dL — ABNORMAL LOW (ref 70–99)

## 2021-10-27 LAB — CBC
HCT: 30.8 % — ABNORMAL LOW (ref 39.0–52.0)
Hemoglobin: 9.3 g/dL — ABNORMAL LOW (ref 13.0–17.0)
MCH: 28 pg (ref 26.0–34.0)
MCHC: 30.2 g/dL (ref 30.0–36.0)
MCV: 92.8 fL (ref 80.0–100.0)
Platelets: 288 10*3/uL (ref 150–400)
RBC: 3.32 MIL/uL — ABNORMAL LOW (ref 4.22–5.81)
RDW: 15.1 % (ref 11.5–15.5)
WBC: 10.4 10*3/uL (ref 4.0–10.5)
nRBC: 0 % (ref 0.0–0.2)

## 2021-10-27 LAB — HEMOGLOBIN A1C
Hgb A1c MFr Bld: 5.1 % (ref 4.8–5.6)
Mean Plasma Glucose: 99.67 mg/dL

## 2021-10-27 MED ORDER — INSULIN ASPART 100 UNIT/ML IJ SOLN
0.0000 [IU] | Freq: Three times a day (TID) | INTRAMUSCULAR | Status: DC
  Administered 2021-10-27 (×2): 2 [IU] via SUBCUTANEOUS
  Administered 2021-10-28: 3 [IU] via SUBCUTANEOUS
  Administered 2021-10-28: 5 [IU] via SUBCUTANEOUS
  Administered 2021-10-28: 3 [IU] via SUBCUTANEOUS
  Administered 2021-10-29: 2 [IU] via SUBCUTANEOUS
  Administered 2021-10-29: 3 [IU] via SUBCUTANEOUS
  Administered 2021-10-29: 5 [IU] via SUBCUTANEOUS
  Administered 2021-10-30: 1 [IU] via SUBCUTANEOUS
  Administered 2021-10-30: 2 [IU] via SUBCUTANEOUS

## 2021-10-27 MED ORDER — INSULIN ASPART 100 UNIT/ML IJ SOLN
0.0000 [IU] | Freq: Every day | INTRAMUSCULAR | Status: DC
  Administered 2021-10-27: 2 [IU] via SUBCUTANEOUS
  Administered 2021-10-28: 3 [IU] via SUBCUTANEOUS
  Administered 2021-10-29: 2 [IU] via SUBCUTANEOUS

## 2021-10-27 NOTE — Progress Notes (Addendum)
Speech Language Pathology Treatment: Dysphagia Patient Details Name: Troy Randall MRN: [id number] DOB: May 24, 1960 Today's Date: 10/27/2021 Time: 7473-4037 SLP Time Calculation (min) (ACUTE ONLY): 24 min  Assessment / Plan / Recommendation Clinical Impression  Pt was cooperative but distractible today, with environment a little more distracting due to people cleaning the outside of his windows. SLP provided Mod cues to attend to POs and to initiate self-feeding. He consumed nectar thick liquids via cup and spoon as well as regular textures. Despite cues to take single bites, he put an entire graham cracker in his mouth. This resulted in quite prolonged oral preparation and residue, and when provided a liquid wash, it resulted in immediate coughing. When given smaller pieces, he had more timely oral preparation, more complete oral clearance, and no further coughing. Recommend continuing Dys 2 diet and nectar thick liquids. Pt would benefit from AIR.    HPI HPI: Pt is a 61 y.o. male with history of stroke in 4/23, HTN, HLD and DM with recent severe jejunal bleed s/p vessel coiling who is being seen for altered mental status and concerning white matter changes on MRI. Patient's wife reports that after his stroke in 4/23 he made a good recovery and was able to perform ADLs. He had some residual dysarthria. Pt presented to [facility] on 8/31 with GI bleeding and has remained persistently altered. Intubated 9/3 and trach placed 9/11. He currently has #6 cuffed trach tolerating cuff deflated.      SLP Plan  Continue with current plan of care      Recommendations for follow up therapy are one component of a multi-disciplinary discharge planning process, led by the attending physician.  Recommendations may be updated based on patient status, additional functional criteria and insurance authorization.    Recommendations  Diet recommendations: Dysphagia 2 (fine chop);Nectar-thick  liquid Liquids provided via: Teaspoon;Cup Medication Administration: Crushed with puree Supervision: Full supervision/cueing for compensatory strategies;Trained caregiver to feed patient Compensations: Slow rate;Small sips/bites;Lingual sweep for clearance of pocketing;Monitor for anterior loss;Minimize environmental distractions Postural Changes and/or Swallow Maneuvers: Seated upright 90 degrees                Oral Care Recommendations: Oral care BID;Staff/trained caregiver to provide oral care Follow Up Recommendations: Acute inpatient rehab (3hours/day) Assistance recommended at discharge: Frequent or constant Supervision/Assistance SLP Visit Diagnosis: Dysphagia, oropharyngeal phase (R13.12) Plan: Continue with current plan of care           Osie Bond., M.A. [place] Office [phone]  Secure chat preferred   10/27/2021, 1:24 PM

## 2021-10-27 NOTE — Progress Notes (Signed)
Inpatient Rehab Admissions Coordinator:  ? ?Per therapy recommendations,  patient was screened for CIR candidacy by Letta Cargile, MS, CCC-SLP. At this time, Pt. Appears to be a a potential candidate for CIR. I will place   order for rehab consult per protocol for full assessment. Please contact me any with questions. ? ?Andrez Lieurance, MS, CCC-SLP ?Rehab Admissions Coordinator  ?[phone] (celll) ?[phone] (office) ? ?

## 2021-10-27 NOTE — Progress Notes (Signed)
Supervising Physician: Richarda Overlie  Patient Status:  [facility] - In-pt  Chief Complaint:  GI bleed, cholecystitis- Cholecystostomy drain placed 09/07/21 and exchanged 10/20/  Subjective:  Pt unable to follow commands or answer questions d/t mental status.   Allergies: Metformin and related and Penicillins  Medications: Prior to Admission medications   Medication Sig Start Date End Date Taking? Authorizing Provider  amLODipine (NORVASC) 10 MG tablet Take 1 tablet (10 mg total) by mouth daily. 06/15/17  Yes Aliene Beams, MD  aspirin EC 81 MG tablet Take 81 mg by mouth daily. Swallow whole.   Yes [provider]  clopidogrel (PLAVIX) 75 MG tablet Take 1 tablet (75 mg total) by mouth daily. 04/30/21  Yes Azucena Fallen, MD  glipiZIDE (GLUCOTROL XL) 10 MG 24 hr tablet Take 1 tablet (10 mg total) by mouth daily with breakfast. 04/16/17  Yes Hagler, Fleet Contras, MD  levocetirizine (XYZAL) 5 MG tablet Take 1 tablet by mouth at bedtime.   Yes [provider]  lisinopril (PRINIVIL,ZESTRIL) 40 MG tablet Take 1 tablet (40 mg total) by mouth daily. 05/12/17  Yes Aliene Beams, MD  metFORMIN (GLUCOPHAGE-XR) 500 MG 24 hr tablet Take 500-1,000 mg by mouth in the morning and at bedtime. 02/09/21  Yes [provider]  rosuvastatin (CRESTOR) 40 MG tablet Take 1 tablet (40 mg total) by mouth daily. 04/29/21  Yes Azucena Fallen, MD  tamsulosin (FLOMAX) 0.4 MG CAPS capsule Take 0.4 mg by mouth daily. 02/12/21  Yes [provider]  traZODone (DESYREL) 150 MG tablet Take 150 mg by mouth at bedtime. 02/04/21  Yes [provider]  TRESIBA FLEXTOUCH 200 UNIT/ML FlexTouch Pen Inject 20 Units into the skin in the morning and at bedtime. 03/14/21  Yes [provider]  BD VEO INSULIN SYRINGE U/F 31G X 15/64" 1 ML MISC  USE AS DIRECTED 06/08/17   Aliene Beams, MD  glucose blood (ONETOUCH VERIO) test strip TEST twice a day 05/04/17   Aliene Beams, MD  Insulin Pen  Needle (NOVOTWIST) 32G X 5 MM MISC Use two daily to inject Victoza and Toujeo. 04/25/15   Reather Littler, MD  [facility] DELICA LANCETS 33G MISC Use to check blood sugar once a day dx code E11.65 11/21/14   Reather Littler, MD     Vital Signs: BP 130/76 (BP Location: Left Arm)   Pulse 90   Temp 98.4 F (36.9 C) (Oral)   Resp 18   Ht 6' (1.829 m)   Wt 276 lb 7.3 oz (125.4 kg)   SpO2 93%   BMI 37.49 kg/m   Physical Exam Vitals reviewed.  Constitutional:      General: He is not in acute distress.    Appearance: He is ill-appearing.  Pulmonary:     Effort: No respiratory distress.     Breath sounds: Normal breath sounds.  Abdominal:     Comments: Drain flushes/aspirates easily. Site unremarkable with sutures/statlock in place. Scant amt bilious OP in gravity bag. Dressing C/D/I.    Skin:    General: Skin is warm and dry.  Neurological:     Mental Status: He is alert. He is disoriented.     Imaging: IR Catheter Tube Change  Result Date: 10/24/2021 INDICATION: History of acute cholecystitis, post ultrasound fluoroscopic guided cholecystostomy tube placement on 09/08/2018. EXAM: FLUOROSCOPIC GUIDED CHOLECYSTOSTOMY TUBE EXCHANGE COMPARISON:  09/07/2021, 10/16/2021 MEDICATIONS: None ANESTHESIA/SEDATION: None CONTRAST:  5 mL Omnipaque 300-administered into the gallbladder lumen. FLUOROSCOPY TIME:  Forty-eight seconds, 11  mGy COMPLICATIONS: None immediate. PROCEDURE: The patient was positioned supine on the fluoroscopy table. The external portion of the existing cholecystostomy tube as well as the surrounding skin was prepped and draped in usual sterile fashion. A time-out was performed prior to the initiation of the procedure. A preprocedural spot fluoroscopic image was obtained of the right upper abdominal quadrant existing cholecystostomy tube. The skin surrounding the cholecystostomy tube was anesthetized with 1% lidocaine with epinephrine. The external portion of the cholecystostomy tube was  cut and cannulated with a short Amplatz wire which was advanced through the tube and coiled within the gallbladder lumen Next, under intermittent fluoroscopic guidance, the existing 10 French cholecystostomy tube was exchanged for a new 10 [place] cholecystostomy tube which was repositioned into the more central aspect of the gallbladder lumen. Contrast injection confirms appropriate positioning and functionality of the cholecystomy tube. The cholecystostomy tube was flushed with a small amount of saline and reconnected to a gravity bag. The cholecystostomy tube was secured with an interrupted suture and a Stat Lock device. A dressing was applied. The patient tolerated the procedure well without immediate postprocedural complication. FINDINGS: Preprocedural spot fluoroscopic image demonstrates unchanged positioning of cholecystostomy tube with end coiled and locked over the expected location of the fundus of the gallbladder After fluoroscopic guided exchange, the new 10 Fr cholecystostomy tube is positioned with end coiled and locked within the central aspect of the gallbladder lumen. Post exchange cholangiogram demonstrates appropriate positioning and functionality of the new cholecystostomy tube. IMPRESSION: Successful fluoroscopic guided exchange of 10.2 Fr French cholecystostomy tube. PLAN: - The patient's cholecystostomy tube was reconnected to a gravity bag. - The patient may return to the interventional radiology drain clinic in 8 weeks for repeat diagnostic cholangiogram/exchange. Marliss Coots, MD [facility] Radiology Electronically Signed   By: Marliss Coots M.D.   On: 10/24/2021 10:08    Labs:  CBC: Recent Labs    10/23/21 0458 10/24/21 0404 10/25/21 0300 10/27/21 0338  WBC 5.5 5.7 6.5 10.4  HGB 8.7* 8.8* 9.3* 9.3*  HCT 28.5* 28.2* 30.3* 30.8*  PLT 259 271 295 288    COAGS: Recent Labs    04/28/21 1504 09/05/21 0307 09/06/21 2240  09/07/21 2056 10/03/21 0816  INR 1.0 1.2 1.6* 2.0* 1.0  APTT 26  --   --  32 <20*    BMP: Recent Labs    10/15/21 0312 10/20/21 0409 10/25/21 0300 10/27/21 0338  NA 140 141 140 143  K 3.7 3.6 3.6 3.8  CL 105 104 104 109  CO2 27 28 24 27   GLUCOSE 117* 73 115* 130*  BUN 14 12 9 11   CALCIUM 8.8* 9.6 9.0 8.9  CREATININE 0.51* 0.64 0.68 0.80  GFRNONAA >60 >60 >60 >60    LIVER FUNCTION TESTS: Recent Labs    10/09/21 0334 10/10/21 0317 10/11/21 0304 10/12/21 0312  BILITOT 0.8 0.6 0.8 0.6  AST 57* 40 26 28  ALT 81* 75* 34 37  ALKPHOS 69 71 37* 47  PROT 4.7* 4.6* 4.7* 4.8*  ALBUMIN 3.7 3.3* 3.7 3.6    Assessment and Plan:  Pt sitting upright in bed. Pt wife at bedside feeding pt lunch.  Drain intact with sutures/statlock in place. Scant amt bilious fluid in gravity bag with  cc documented OP over last hours.   Output by Drain (mL) 10/25/21 0701 - 10/25/21 1900 10/25/21 1901 - 10/26/21 0700 10/26/21 0701 - 10/26/21 1900 10/26/21 1901 - 10/27/21 0700 10/27/21 0701 - 10/27/21  1444  Biliary Tube Cook slip-coat 10.2 Fr. RUQ  50  50      Continue documenting OP in Epic q shift.  Continue flushing TID.  Change dressing q shift or as needed.   Please call IR if difficulty flushing or sudden change in output.      Electronically Signed: Tyson Alias, NP 10/27/2021, 2:35 PM   I spent a total of 15 Minutes at the the patient's bedside AND on the patient's hospital floor or unit, greater than 50% of which was counseling/coordinating care for cholecystitis.

## 2021-10-27 NOTE — Progress Notes (Signed)
Physical Therapy Treatment Patient Details Name: Troy Randall MRN: [id number] DOB: 28-Jul-1960 Today's Date: 10/27/2021   History of Present Illness Patient is a 61 y/o male who presents on 09/04/21 with dark stools, dizziness and SOB. Found to have GI bleed and acute anemia s/p embolization 09/06/21 and percutaneous cholecystostomy 09/07/21. Intubated 9/3, CRRT 9/5-9/8. Hemorrhagic and septic shock 9/5. Trach placed 9/11. 9/16 MRI rapid development numerous round white matter lesions in both cerebral hemispheres; 9/17 MRI: numerous white matter lesions consistent with active demyelination - concerning for MS, ADEM, and cerebral vasculitis; 9/18 Lumbar puncture. Pt returned to ICU on 9/29 with aspiration PNA, tachypnea, tachycardia and increased WOB. 10/2 - pt transferred back to [facility]; started having GIB. Underwent sigmoidoscopy on 10/07/2021. Plasma exchange started. 10/17 trach capped. 10/19 decanulated. s/p Cholecystostomy tube exchange 10/24/21. PMH includes CVA, DM, HTN.    PT Comments    Pt is making excellent progress towards his physical therapy goals, exhibiting improvements in cognition, activity tolerance and ambulation distance. Pt eager/motivated to participate in therapy session; per pt wife, pt asking all weekend to walk. Pt requiring two person min-mod assist for transfers and ambulation. Pt ambulating total of 50 ft with a RW and close chair follow (seated rest break in between bouts). Demonstrates sway back posture and knee instability during gait. Overall, pt is a great candidate for AIR based on level of progress in short amount of time, in addition to his age, PLOF, motivation and family support.     Recommendations for follow up therapy are one component of a multi-disciplinary discharge planning process, led by the attending physician.  Recommendations may be updated based on patient status, additional functional criteria and insurance authorization.  Follow Up Recommendations   Acute inpatient rehab (3hours/day)     Assistance Recommended at Discharge Frequent or constant Supervision/Assistance  Patient can return home with the following Two people to help with walking and/or transfers;Assistance with cooking/housework;Assist for transportation;Help with stairs or ramp for entrance;A lot of help with bathing/dressing/bathroom   Equipment Recommendations  Rolling walker (2 wheels)    Recommendations for Other Services Rehab consult     Precautions / Restrictions Precautions Precautions: Fall Precaution Comments: R drain Restrictions Weight Bearing Restrictions: No     Mobility  Bed Mobility Overal bed mobility: Needs Assistance Bed Mobility: Supine to Sit, Sit to Supine     Supine to sit: Max assist, +2 for physical assistance Sit to supine: Max assist, +2 for physical assistance   General bed mobility comments: MaxA + 2 due to decreased initiation (rather than true ability)    Transfers Overall transfer level: Needs assistance Equipment used: Rolling walker (2 wheels) Transfers: Sit to/from Stand Sit to Stand: Min assist, +2 physical assistance           General transfer comment: MinA + 2 to stand from edge of bed and recliner. Hand over hand guidance for placement and verbal cues to initiate    Ambulation/Gait Ambulation/Gait assistance: Mod assist, +2 physical assistance Gait Distance (Feet): 50 Feet (20", 30") Assistive device: Rolling walker (2 wheels) Gait Pattern/deviations: Step-through pattern, Narrow base of support, Knee hyperextension - left, Scissoring Gait velocity: decreased Gait velocity interpretation: <1.8 ft/sec, indicate of risk for recurrent falls   General Gait Details: Pt requiring modA + 2 for balance and close chair follow. Demonstrates scissoring gait pattern and narrow BOS, did not correct with verbal cueing. Knee instability with fatigue   Stairs  Wheelchair Mobility    Modified Rankin  (Stroke Patients Only)       Balance Overall balance assessment: Needs assistance Sitting-balance support: Feet supported, Single extremity supported Sitting balance-Leahy Scale: Fair     Standing balance support: During functional activity, Bilateral upper extremity supported, Reliant on assistive device for balance Standing balance-Leahy Scale: Poor Standing balance comment: Requires UE support in standing.                            Cognition Arousal/Alertness: Awake/alert Behavior During Therapy: Flat affect Overall Cognitive Status: Impaired/Different from baseline Area of Impairment: Following commands, Attention, Problem solving, Safety/judgement                   Current Attention Level: Focused, Sustained   Following Commands: Follows one step commands with increased time, Follows one step commands inconsistently Safety/Judgement: Decreased awareness of deficits, Decreased awareness of safety   Problem Solving: Slow processing, Decreased initiation, Difficulty sequencing, Requires verbal cues, Requires tactile cues General Comments: Demonstrates some improving attention and command following, still has significant deficits in motor planning and processing. verbalizing basic needs i.e. needing drink        Exercises      General Comments        Pertinent Vitals/Pain Pain Assessment Pain Assessment: Faces Faces Pain Scale: Hurts a little bit Pain Location: bottom Pain Descriptors / Indicators: Discomfort, Restless Pain Intervention(s): Monitored during session    Home Living                          Prior Function            PT Goals (current goals can now be found in the care plan section) Acute Rehab PT Goals Patient Stated Goal: to go to rehab per wife Time For Goal Achievement: 11/10/21 Potential to Achieve Goals: Good Progress towards PT goals: Progressing toward goals    Frequency    Min 3X/week      PT  Plan Current plan remains appropriate    Co-evaluation PT/OT/SLP Co-Evaluation/Treatment: Yes Reason for Co-Treatment: For patient/therapist safety;To address functional/ADL transfers;Necessary to address cognition/behavior during functional activity PT goals addressed during session: Mobility/safety with mobility        AM-PAC PT "6 Clicks" Mobility   Outcome Measure  Help needed turning from your back to your side while in a flat bed without using bedrails?: A Lot Help needed moving from lying on your back to sitting on the side of a flat bed without using bedrails?: Total Help needed moving to and from a bed to a chair (including a wheelchair)?: A Little Help needed standing up from a chair using your arms (e.g., wheelchair or bedside chair)?: A Little Help needed to walk in hospital room?: Total Help needed climbing 3-5 steps with a railing? : Total 6 Click Score: 11    End of Session Equipment Utilized During Treatment: Gait belt Activity Tolerance: Patient tolerated treatment well Patient left: in bed;with call bell/phone within reach;with bed alarm set;with family/visitor present Nurse Communication: Mobility status PT Visit Diagnosis: Muscle weakness (generalized) (M62.81);Other symptoms and signs involving the nervous system (R29.898);Pain     Time: 5956-3875 PT Time Calculation (min) (ACUTE ONLY): 32 min  Charges:  $Therapeutic Activity: 8-22 mins                     Wyona Almas, PT, DPT Acute Rehabilitation  Services Office [phone]    Norval Morton 10/27/2021, 10:04 AM

## 2021-10-27 NOTE — Progress Notes (Signed)
Inpatient Rehab Admissions Coordinator:  ? ?Per therapy recommendations,  patient was screened for CIR candidacy by Shyler Holzman, MS, CCC-SLP. At this time, Pt. Appears to be a a potential candidate for CIR. I will place   order for rehab consult per protocol for full assessment. Please contact me any with questions. ? ?Romaine Maciolek, MS, CCC-SLP ?Rehab Admissions Coordinator  ?[phone] (celll) ?[phone] (office) ? ?

## 2021-10-27 NOTE — Progress Notes (Signed)
Progress Note    Troy Randall  [id number] DOB: 1960/08/31  DOA: 09/04/2021 PCP: [name], MD      Brief Narrative:    Medical records reviewed and are as summarized below:  Troy Randall is a 61 y.o. male with medical history of hypertension, hyperlipidemia, type 2 diabetes, CVA, presented to the hospital with GI bleed.  Patient underwent IR embolization followed by percutaneous cholecystostomy tube placement due to ongoing altered mental status.  MRI of the brain showed numerous contrast-enhancing bilateral white matter lesions concerning for demyelinating disease.  Neurology was consulted and underwent lumbar puncture on 09/22/2021 with low suspicion for infectious etiology.  Neurology has initiated the patient on high-dose steroids on 09/23/2021.  See other events below.  Significant events. 8/31 presented to [facility] ED, [name] Admit, GI Consult 9/1 CTA> no evidence of active GI bleed, 2 U PRBC 9/2 2 U PRBC, colonoscopy > no active bleed found but large clots throughout entire colon. EGD> small hiatal hernia, erosive gastropathy with no stigmata of recent bleeding, and non-bleeding duodenal diverticulum.   Underwent then small bowel enteroscopy which noted intermittent blood pumping in the proximal jejunum but was unable to be reached despite multiple attempts, was injected and clip placed. Repeat CTA repeated, which showed positive jejunal diverticular bleeding. IR> active extrav at [facility] territory, jejunal arcade branch, successful coil embo 9/3 Intubated. CTA> no evidence of active GI bleeding.  New finding of abnormal gallbladder with nondependent air, gallbladder wall air, pericholecystic and right upper quadrant information.  IR placed percutaneous cholecystotomy tube.  9/4 R. IJ central line placed with dialysis triple lumen catheter 9/5 CRRT initiated 9/6 Bedside EGD without any evidence of bleeding. CTA a/p without etiology for bleed 9/8 CRRT discontinued. Required ETT  exchange yesterday due to blown cuff. 9/11 Bronchoscopic guided percutaneous tracheostomy placement.  Right IJ central line removed. 9/15 flexible sigmoidoscopy with removal of endoscopy 9/16 MRI brain w/o contrast amended study secondary to range of motion.  Rapid development numerous round white matter lesions in both cerebral hemispheres.  Prior MRIs. 9/17 MRI brain: Numerous contrast-enhancing bilateral white matter lesions, consistent with active demyelination. 9/18 Lumbar puncture  9/19 MRI cervical spine, thoracic : Limited exam, within limitations there is not definitive evidence of active demyelinization. Within limitations of motion artifact, there may be demyelinating lesions at the C6 and T1 vertebral body levels. 9/25-clinical exam not improved despite completion of 5-day IV steroids.  Neurology planning to repeat MRI of the brain cervical spine thoracic spine. Possible IR consult for catheter placement for possible Plex therapy if abnormal scans 9/29-patient transferred to ICU for respiratory distress due to aspiration pneumonia with sepsis 10/2-patient transferred back to [facility]; started having GI bleed, Dr. Mann/Dr. Benson Norway reconsulted.  Underwent sigmoidoscopy on 10/07/2021.  Plasma exchange started       Assessment/Plan:   Principal Problem:   Acute metabolic encephalopathy Active Problems:   Uncontrolled type 2 diabetes mellitus with hyperglycemia, without long-term current use of insulin (HCC)   Essential hypertension   Hyperlipidemia   Chronic respiratory failure with hypoxia (HCC)   Tracheostomy status (HCC)   Anemia due to blood loss   History of lower GI bleeding: 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    H/O ischemic bowel disease   Acute emphysematous cholecystitis   Pressure injury of skin   Acute respiratory insufficiency   obesity Body mass index is 37.49 kg/m.    Acute metabolic encephalopathy -MRI brain  showed bilateral white matter  lesions concerning for active demyelinating disease -S/p IV steroids for 5 days with no significant improvement He underwent plasma exchange.  He had 5 doses and last dose was on 10/15/2021.  No further recommendations from neurology other than rehabilitation.   Hemorrhagic shock/GI bleed -Patient has had multiple endoscopic procedures last month including EGD/capsule endoscopy/coil embolization of jejunal branch of SMA. -Possibly from stress ulceration/ischemic disease as per GI: Status post sigmoidoscopy on 10/07/2021 which showed single solitary ulcer in the sigmoid colon which was biopsied and pathology shows chronic colitis. -Has had multiple packed red cell transfusion during this hospitalization. -Monitor H&H.  Transfuse packed red cells if hemoglobin is less than 7.   Acute respiratory failure with hypoxia status post tracheostomy Aspiration pneumonia -Status post tracheostomy placement on 09/15/2020.  PCCM following weekly. -Patient had acute aspiration event on 10/03/2021 and was placed on broad-spectrum antibiotics: Trach cultures grew MRSA and E. coli.  Treated with Rocephin and vancomycin and completed therapy.   Tracheostomy was successfully decannulated 10/23/2021.   E. coli cholecystitis status post percutaneous cholecystostomy drain placement -Had percutaneous cholecystostomy drain placement on 09/07/2021.  Subsequently had completed course of antibiotics. Cholecystostomy tube was exchanged by IR on 10/24/2021. -Will eventually need outpatient surgery/IR follow-up   Hypokalemia, hypophosphatemia, elevated liver enzymes, thrombocytopenia, AKI: Resolved. CCRT was discontinued on 09/12/2021.  Diabetes mellitus type 2 with hyperglycemia: Decrease Semglee from 20 units back to 15 units twice daily  Hypertension -Continue monitoring blood pressure.  Currently on doxazosin      Stage II medial sacral ulcer: Not present on admission -Continue local wound care.   Acute urinary  retention -Foley catheter removed on 10/13/2021 but Foley catheter was reinserted on 10/14/2021. Continue doxazosin   Moderate protein calorie malnutrition/poor p.o. intake/dysphagia:  He is tolerating dysphagia 2 diet.  Discontinue Cortrak feeding tube.    Hypokalemia, hypophosphatemia, elevated liver enzymes, thrombocytopenia, AKI: Resolved. CCRT was discontinued on 09/12/2021.    Diet Order             DIET DYS 2 Room service appropriate? Yes; Fluid consistency: Nectar Thick  Diet effective now                      Consultants: Gastroenterologist, neurologist, intensivist nephrologist, general surgeon, interventional radiologist      Medications:    Chlorhexidine Gluconate Cloth  6 each Topical Daily   docusate sodium  100 mg Oral BID   doxazosin  2 mg Oral Daily   enoxaparin (LOVENOX) injection  40 mg Subcutaneous Q24H   feeding supplement (GLUCERNA SHAKE)  237 mL Oral TID BM   free water  250 mL Per Tube Q6H   influenza vac split quadrivalent PF  0.5 mL Intramuscular Tomorrow-1000   insulin aspart  0-15 Units Subcutaneous Q4H   insulin glargine-yfgn  15 Units Subcutaneous BID   lidocaine  1 patch Transdermal Q24H   mouth rinse  15 mL Mouth Rinse 4 times per day   pantoprazole  40 mg Oral Daily   pneumococcal 23 valent vaccine  0.5 mL Intramuscular Tomorrow-1000   polyethylene glycol  17 g Oral Daily   rosuvastatin  40 mg Oral Daily   sodium chloride flush  10-40 mL Intracatheter Q12H   Zinc Oxide   Topical BID   Continuous Infusions:  sodium chloride Stopped (10/07/21 1454)   citrate dextrose        Family Communication/Anticipated D/C date and plan/Code Status   DVT prophylaxis: enoxaparin (  LOVENOX) injection 40 mg Start: 10/21/21 1415 Place and maintain sequential compression device Start: 10/07/21 2222 Place and maintain sequential compression device Start: 09/26/21 1759     Code Status: Full Code  Family Communication: Plan discussed with his  wife at the bedside Disposition Plan: Plan to discharge to SNF/cir when medically stable          Subjective:   Back pain better per patient    Objective:    Vitals:   10/25/21 0831 10/26/21 0358 10/26/21 0906 10/26/21 2133  BP: 123/83 130/76 123/83 123/70  Pulse: (!) 103 93 97 91  Resp: 20 20 20 18   Temp: (!) 97.5 F (36.4 C) 98.1 F (36.7 C) 98.2 F (36.8 C) 99.3 F (37.4 C)  TempSrc: Oral Oral Oral   SpO2:  96%  93%  Weight:      Height:       No data found.   Intake/Output Summary (Last 24 hours) at 10/27/2021 1018 Last data filed at 10/27/2021 0500 Gross per 24 hour  Intake --  Output 650 ml  Net -650 ml   Filed Weights   10/10/21 1048 10/15/21 1344 10/15/21 1625  Weight: 128.9 kg 125.4 kg 125.4 kg    Exam:   General: Appearance:    Obese male in no acute distress     Lungs:     respirations unlabored  Heart:    Normal heart rate.   MS:   All extremities are intact.   Neurologic:   Awake, alert,         Pressure Injury 10/06/21 Sacrum Medial Stage 2 -  Partial thickness loss of dermis presenting as a shallow open injury with a red, pink wound bed without slough. (Active)  10/06/21 2000  Location: Sacrum  Location Orientation: Medial  Staging: Stage 2 -  Partial thickness loss of dermis presenting as a shallow open injury with a red, pink wound bed without slough.  Wound Description (Comments):   Present on Admission:   Dressing Type Foam - Lift dressing to assess site every shift 10/27/21 0500     Data Reviewed:   I have personally reviewed following labs and imaging studies:  Labs: Labs show the following:   Basic Metabolic Panel: Recent Labs  Lab 10/22/21 0437 10/23/21 0458 10/24/21 0404 10/25/21 0300 10/26/21 0421 10/27/21 0338  NA  --   --   --  140  --  143  K  --   --   --  3.6  --  3.8  CL  --   --   --  104  --  109  CO2  --   --   --  24  --  27  GLUCOSE  --   --   --  115*  --  130*  BUN  --   --   --  9  --   11  CREATININE  --   --   --  0.68  --  0.80  CALCIUM  --   --   --  9.0  --  8.9  MG 2.0 2.1 2.1 2.0 2.0  --   PHOS 3.1 4.1 4.5 4.0 3.8  --    GFR Estimated Creatinine Clearance: 136 mL/min (by C-G formula based on SCr of 0.8 mg/dL). Liver Function Tests: No results for input(s): "AST", "ALT", "ALKPHOS", "BILITOT", "PROT", "ALBUMIN" in the last 168 hours. No results for input(s): "LIPASE", "AMYLASE" in the last 168 hours. No results for input(s): "AMMONIA"  in the last 168 hours. Coagulation profile No results for input(s): "INR", "PROTIME" in the last 168 hours.  CBC: Recent Labs  Lab 10/21/21 0416 10/22/21 0437 10/23/21 0458 10/24/21 0404 10/25/21 0300 10/27/21 0338  WBC 5.4 6.5 5.5 5.7 6.5 10.4  NEUTROABS 3.1 4.2 3.3 3.2 4.2  --   HGB 8.7* 8.4* 8.7* 8.8* 9.3* 9.3*  HCT 27.8* 26.7* 28.5* 28.2* 30.3* 30.8*  MCV 93.3 91.8 91.9 91.0 91.3 92.8  PLT 265 249 259 271 295 288   Cardiac Enzymes: No results for input(s): "CKTOTAL", "CKMB", "CKMBINDEX", "TROPONINI" in the last 168 hours. BNP (last 3 results) No results for input(s): "PROBNP" in the last 8760 hours. CBG: Recent Labs  Lab 10/26/21 2017 10/27/21 0004 10/27/21 0356 10/27/21 0746 10/27/21 0909  GLUCAP 190* 112* 141* 58* 110*   D-Dimer: No results for input(s): "DDIMER" in the last 72 hours. Hgb A1c: No results for input(s): "HGBA1C" in the last 72 hours. Lipid Profile: No results for input(s): "CHOL", "HDL", "LDLCALC", "TRIG", "CHOLHDL", "LDLDIRECT" in the last 72 hours. Thyroid function studies: No results for input(s): "TSH", "T4TOTAL", "T3FREE", "THYROIDAB" in the last 72 hours.  Invalid input(s): "FREET3" Anemia work up: No results for input(s): "VITAMINB12", "FOLATE", "FERRITIN", "TIBC", "IRON", "RETICCTPCT" in the last 72 hours. Sepsis Labs: Recent Labs  Lab 10/23/21 0458 10/24/21 0404 10/25/21 0300 10/27/21 0338  WBC 5.5 5.7 6.5 10.4    Microbiology No results found for this or any  previous visit (from the past 240 hour(s)).  Procedures and diagnostic studies:  No results found.      LOS: 67 days   Geradine Girt  [facility]   Pager on [email or web address]. If 7PM-7AM, please contact night-coverage at [email or web address]     10/27/2021, 10:18 AM

## 2021-10-27 NOTE — Progress Notes (Signed)
Occupational Therapy Treatment Patient Details Name: Troy Randall MRN: [id number] DOB: [birth year] Today's Date: 10/27/2021   History of present illness Patient is a 61 y/o male who presents on 09/04/21 with dark stools, dizziness and SOB. Found to have GI bleed and acute anemia s/p embolization 09/06/21 and percutaneous cholecystostomy 09/07/21. Intubated 9/3, CRRT 9/5-9/8. Hemorrhagic and septic shock 9/5. Trach placed 9/11. 9/16 MRI rapid development numerous round white matter lesions in both cerebral hemispheres; 9/17 MRI: numerous white matter lesions consistent with active demyelination - concerning for MS, ADEM, and cerebral vasculitis; 9/18 Lumbar puncture. Pt returned to ICU on 9/29 with aspiration PNA, tachypnea, tachycardia and increased WOB. 10/2 - pt transferred back to [facility]; started having GIB. Underwent sigmoidoscopy on 10/07/2021. Plasma exchange started. 10/17 trach capped. 10/19 decanulated. s/p Cholecystostomy tube exchange 10/24/21. PMH includes CVA, DM, HTN.   OT comments  Patient making good gains with therapy.  Patient continues to require max assist of 2 for bed mobility due to processing and decreased assistance with sit to stands and transfers. Patient required frequent cues to follow directions and is able to express needs, "drink" "tired". Patient would benefit from further OT services to increase independence with self care and functional transfers. Patient is good candidate for AIR for continued OT.    Recommendations for follow up therapy are one component of a multi-disciplinary discharge planning process, led by the attending physician.  Recommendations may be updated based on patient status, additional functional criteria and insurance authorization.    Follow Up Recommendations  Acute inpatient rehab (3hours/day)    Assistance Recommended at Discharge Frequent or constant Supervision/Assistance  Patient can return home with the following  Two people to help with  walking and/or transfers;A lot of help with bathing/dressing/bathroom;Assistance with feeding;Assistance with cooking/housework;Direct supervision/assist for medications management;Direct supervision/assist for financial management;Assist for transportation;Help with stairs or ramp for entrance   Equipment Recommendations  BSC/3in1;Wheelchair (measurements OT);Wheelchair cushion (measurements OT);Hospital bed    Recommendations for Other Services      Precautions / Restrictions Precautions Precautions: Fall Precaution Comments: R drain Restrictions Weight Bearing Restrictions: No       Mobility Bed Mobility Overal bed mobility: Needs Assistance Bed Mobility: Supine to Sit, Sit to Supine     Supine to sit: Max assist, +2 for physical assistance Sit to supine: Max assist, +2 for physical assistance   General bed mobility comments: MaxA + 2 due to decreased initiation (rather than true ability)    Transfers Overall transfer level: Needs assistance Equipment used: Rolling walker (2 wheels) Transfers: Sit to/from Stand Sit to Stand: Min assist, +2 physical assistance           General transfer comment: verbal and tactile cues for hand placement and min assist +2 to stand and transfer     Balance Overall balance assessment: Needs assistance Sitting-balance support: Feet supported, Single extremity supported Sitting balance-Leahy Scale: Fair     Standing balance support: During functional activity, Bilateral upper extremity supported, Reliant on assistive device for balance Standing balance-Leahy Scale: Poor Standing balance comment: Requires UE support in standing.                           ADL either performed or assessed with clinical judgement   ADL Overall ADL's : Needs assistance/impaired                 Upper Body Dressing : Moderate assistance;Sitting Upper Body Dressing Details (indicate cue  type and reason): to donn gown to cover  back Lower Body Dressing: Bed level;Maximal assistance Lower Body Dressing Details (indicate cue type and reason): to donn socks               General ADL Comments: improvement with following commands has good potential for improving with self care    Extremity/Trunk Assessment              Vision       Perception     Praxis      Cognition Arousal/Alertness: Awake/alert Behavior During Therapy: Flat affect Overall Cognitive Status: Impaired/Different from baseline Area of Impairment: Following commands, Attention, Problem solving, Safety/judgement                   Current Attention Level: Focused, Sustained   Following Commands: Follows one step commands with increased time, Follows one step commands inconsistently Safety/Judgement: Decreased awareness of deficits, Decreased awareness of safety   Problem Solving: Slow processing, Decreased initiation, Difficulty sequencing, Requires verbal cues, Requires tactile cues General Comments: slow processing but improvement on following commands. Able to express needs        Exercises      Shoulder Instructions       General Comments      Pertinent Vitals/ Pain       Pain Assessment Pain Assessment: Faces Faces Pain Scale: Hurts a little bit Pain Location: bottom Pain Descriptors / Indicators: Discomfort, Restless Pain Intervention(s): Monitored during session, Repositioned  Home Living                                          Prior Functioning/Environment              Frequency  Min 2X/week        Progress Toward Goals  OT Goals(current goals can now be found in the care plan section)  Progress towards OT goals: Progressing toward goals  Acute Rehab OT Goals OT Goal Formulation: With family Time For Goal Achievement: 10/30/21 Potential to Achieve Goals: Fair ADL Goals Pt/caregiver will Perform Home Exercise Program: Increased strength;Both right and left upper  extremity;With written HEP provided;With Supervision Additional ADL Goal #1: Pt will follow one step commands within 10 seconds with 75% accuracy Additional ADL Goal #2: Pt will perform bed mobility with Min A + 2 in preparation for ADLs Additional ADL Goal #3: Pt will roll with Min A for pressure relief and pericare.  Plan Discharge plan remains appropriate    Co-evaluation    PT/OT/SLP Co-Evaluation/Treatment: Yes Reason for Co-Treatment: For patient/therapist safety;To address functional/ADL transfers PT goals addressed during session: Mobility/safety with mobility OT goals addressed during session: ADL's and self-care      AM-PAC OT "6 Clicks" Daily Activity     Outcome Measure   Help from another person eating meals?: A Lot Help from another person taking care of personal grooming?: Total Help from another person toileting, which includes using toliet, bedpan, or urinal?: Total Help from another person bathing (including washing, rinsing, drying)?: Total Help from another person to put on and taking off regular upper body clothing?: A Lot Help from another person to put on and taking off regular lower body clothing?: A Lot 6 Click Score: 9    End of Session Equipment Utilized During Treatment: Gait belt;Rolling walker (2 wheels)  OT Visit Diagnosis: Muscle weakness (generalized) (M62.81);Other  symptoms and signs involving cognitive function;Pain;Unsteadiness on feet (R26.81)   Activity Tolerance Patient tolerated treatment well   Patient Left in bed;with call bell/phone within reach;with family/visitor present   Nurse Communication Mobility status        Time: 1165-7903 OT Time Calculation (min): 29 min  Charges: OT General Charges $OT Visit: 1 Visit OT Treatments $Self Care/Home Management : 8-22 mins  Lodema Hong, [place]  Office [place] 10/27/2021, 11:27 AM

## 2021-10-27 NOTE — Plan of Care (Signed)
  Problem: Health Behavior/Discharge Planning: Goal: Ability to manage health-related needs will improve Outcome: Adequate for Discharge   Problem: Clinical Measurements: Goal: Ability to maintain clinical measurements within normal limits will improve Outcome: Adequate for Discharge Goal: Will remain free from infection Outcome: Adequate for Discharge   

## 2021-10-28 ENCOUNTER — Encounter (HOSPITAL_COMMUNITY): Payer: Self-pay

## 2021-10-28 DIAGNOSIS — G9341 Metabolic encephalopathy: Secondary | ICD-10-CM | POA: Diagnosis not present

## 2021-10-28 LAB — GLUCOSE, CAPILLARY
Glucose-Capillary: 174 mg/dL — ABNORMAL HIGH (ref 70–99)
Glucose-Capillary: 204 mg/dL — ABNORMAL HIGH (ref 70–99)
Glucose-Capillary: 220 mg/dL — ABNORMAL HIGH (ref 70–99)
Glucose-Capillary: 258 mg/dL — ABNORMAL HIGH (ref 70–99)
Glucose-Capillary: 252 mg/dL — ABNORMAL HIGH (ref 70–99)

## 2021-10-28 MED ORDER — ACETAMINOPHEN 325 MG PO TABS
650.0000 mg | ORAL_TABLET | Freq: Four times a day (QID) | ORAL | Status: DC | PRN
  Administered 2021-10-28 – 2021-10-30 (×6): 650 mg via ORAL
  Filled 2021-10-28 (×6): qty 2

## 2021-10-28 NOTE — Progress Notes (Signed)
Supervising Physician: Ruthann Cancer  Patient Status:  [facility] - In-pt  Chief Complaint:  GI bleed, cholecystitis--cholecystostomy drain placed 09/07/21 and exchanged 10/24/21  Subjective:  Confused.  Resting. Wife in room.  Allergies: Metformin and related and Penicillins  Medications: Prior to Admission medications   Medication Sig Start Date End Date Taking? Authorizing Provider  amLODipine (NORVASC) 10 MG tablet Take 1 tablet (10 mg total) by mouth daily. 06/15/17  Yes Caren Macadam, MD  aspirin EC 81 MG tablet Take 81 mg by mouth daily. Swallow whole.   Yes [provider]  clopidogrel (PLAVIX) 75 MG tablet Take 1 tablet (75 mg total) by mouth daily. 04/30/21  Yes Little Ishikawa, MD  glipiZIDE (GLUCOTROL XL) 10 MG 24 hr tablet Take 1 tablet (10 mg total) by mouth daily with breakfast. 04/16/17  Yes Hagler, Apolonio Schneiders, MD  levocetirizine (XYZAL) 5 MG tablet Take 1 tablet by mouth at bedtime.   Yes [provider]  lisinopril (PRINIVIL,ZESTRIL) 40 MG tablet Take 1 tablet (40 mg total) by mouth daily. 05/12/17  Yes Caren Macadam, MD  metFORMIN (GLUCOPHAGE-XR) 500 MG 24 hr tablet Take 500-1,000 mg by mouth in the morning and at bedtime. 02/09/21  Yes [provider]  rosuvastatin (CRESTOR) 40 MG tablet Take 1 tablet (40 mg total) by mouth daily. 04/29/21  Yes Little Ishikawa, MD  tamsulosin (FLOMAX) 0.4 MG CAPS capsule Take 0.4 mg by mouth daily. 02/12/21  Yes [provider]  traZODone (DESYREL) 150 MG tablet Take 150 mg by mouth at bedtime. 02/04/21  Yes [provider]  TRESIBA FLEXTOUCH 200 UNIT/ML FlexTouch Pen Inject 20 Units into the skin in the morning and at bedtime. 03/14/21  Yes [provider]  BD VEO INSULIN SYRINGE U/F 31G X 15/64" 1 ML MISC  USE AS DIRECTED 06/08/17   Caren Macadam, MD  glucose blood (ONETOUCH VERIO) test strip TEST twice a day 05/04/17   Caren Macadam, MD  Insulin Pen Needle (NOVOTWIST) 32G X 5 MM MISC  Use two daily to inject Victoza and Toujeo. 04/25/15   Elayne Snare, MD  [facility] DELICA LANCETS 66A MISC Use to check blood sugar once a day dx code E11.65 11/21/14   Elayne Snare, MD     Vital Signs: BP 135/72 (BP Location: Left Arm)   Pulse 77   Temp 98.7 F (37.1 C)   Resp 18   Ht 6' (1.829 m)   Wt 269 lb 10 oz (122.3 kg)   SpO2 100%   BMI 36.57 kg/m   Physical Exam Vitals reviewed.  Constitutional:      Appearance: He is ill-appearing.  HENT:     Head: Normocephalic and atraumatic.  Cardiovascular:     Rate and Rhythm: Normal rate.  Pulmonary:     Effort: Pulmonary effort is normal. No respiratory distress.  Abdominal:     Palpations: Abdomen is soft.  Neurological:     Mental Status: He is disoriented.   Drain Location: RUQ Size: Fr size: 10 Fr Date of placement: exchanged on 10/24/21 Currently to: Drain collection device: gravity 24 hour output:  Output by Drain (mL) 10/26/21 0700 - 10/26/21 1459 10/26/21 1500 - 10/26/21 2259 10/26/21 2300 - 10/27/21 0659 10/27/21 0700 - 10/27/21 1459 10/27/21 1500 - 10/27/21 2259 10/27/21 2300 - 10/28/21 0659 10/28/21 0700 - 10/28/21 1127  Biliary Tube Cook slip-coat 10.2 Fr. RUQ   50  15      Current examination: Flushes/aspirates easily.  Output is cloudy  and bilious. Insertion site unremarkable. Suture and in place. Dressed appropriately.   Imaging: No results found.  Labs:  CBC: Recent Labs    10/23/21 0458 10/24/21 0404 10/25/21 0300 10/27/21 0338  WBC 5.5 5.7 6.5 10.4  HGB 8.7* 8.8* 9.3* 9.3*  HCT 28.5* 28.2* 30.3* 30.8*  PLT 259 271 295 288    COAGS: Recent Labs    04/28/21 1504 09/05/21 0307 09/06/21 2240 09/07/21 2056 10/03/21 0816  INR 1.0 1.2 1.6* 2.0* 1.0  APTT 26  --   --  32 <20*    BMP: Recent Labs    10/15/21 0312 10/20/21 0409 10/25/21 0300 10/27/21 0338  NA 140 141 140 143  K 3.7 3.6 3.6 3.8  CL 105 104 104 109  CO2 27 28 24 27   GLUCOSE 117* 73 115* 130*  BUN 14 12 9 11    CALCIUM 8.8* 9.6 9.0 8.9  CREATININE 0.51* 0.64 0.68 0.80  GFRNONAA >60 >60 >60 >60    LIVER FUNCTION TESTS: Recent Labs    10/09/21 0334 10/10/21 0317 10/11/21 0304 10/12/21 0312  BILITOT 0.8 0.6 0.8 0.6  AST 57* 40 26 28  ALT 81* 75* 34 37  ALKPHOS 69 71 37* 47  PROT 4.7* 4.6* 4.7* 4.8*  ALBUMIN 3.7 3.3* 3.7 3.6    Assessment and Plan:  Cholecystitis s/p cholecystostomy drain placement  --Drain functioning well Continue TID flushes with 5 cc NS. Record output Q shift. Dressing changes QD or PRN if soiled.  Call IR APP or on call IR MD if difficulty flushing or sudden change in drain output.  Repeat imaging/possible drain injection once output < 10 mL/QD (excluding flush material). Consideration for drain removal if output is < 10 mL/QD (excluding flush material), pending discussion with the providing surgical service. --Additional plans per [facility]  IR will continue to follow - please call with questions or concerns.  Electronically Signed: 12/11/21, PA 10/28/2021, 11:23 AM   I spent a total of 15 Minutes at the the patient's bedside AND on the patient's hospital floor or unit, greater than 50% of which was counseling/coordinating care for cholecystostomy tube management

## 2021-10-28 NOTE — Progress Notes (Signed)
Inpatient Rehab Admissions Coordinator:   I met with pt. To discuss potential CIR admit. He and wife were both present and state that they are interested. Wife is able to provide 24/7 min assist at d/c. I will open a case with pt.'s insurance and pursue for admit.    , MS, CCC-SLP Rehab Admissions Coordinator  [phone] (celll) [phone] (office)  

## 2021-10-28 NOTE — PMR Pre-admission (Signed)
PMR Admission Coordinator Pre-Admission Assessment  Patient: Troy Randall is an 61 y.o., male MRN: [id number] DOB: 1960-01-29 Height: 6' (182.9 cm) Weight: 122.3 kg  Insurance Information HMO:     PPO: yes     PCP:      IPA:      80/20:      OTHER:  PRIMARYJessica Priest      Policy#: [id number]      Subscriber: Pt. CM Name: [name]     Phone#: [phone]     Fax#: [phone] Pre-Cert#: [id number] approved by Dwayne for 7 days from 10/25 to 11/04/21 with update due 11/05/21     Employer: Disabled Benefits:  Phone #: [phone]     Name: Verified Irene Shipper Date: 01/06/2020- still active Deductible: does not have OOP Max: $5,900 ($522.70 met) CIR: $375/day co-pay with a max co-pay of $1,875/admission (5 days) SNF: $0/day co-pay for days 1-20, $196/day co-pay for days 21-100; limited to 100 days/cal yr. Outpatient:  $35/visit co-pay Home Health:  80% coverage; 20% co-insurance DME: 80% coverage; 20% co-insurance Providers: in network   SECONDARY:       Policy#:      Phone#:   Development worker, community:       Phone#:   The Engineer, petroleum" for patients in Inpatient Rehabilitation Facilities with attached "Privacy Act [place] Records" was provided and verbally reviewed with: Patient  Emergency Contact Information Contact Information     Name Relation Home Work Mobile   [place] Spouse   [phone]       Current Medical History  Patient Admitting Diagnosis: Debility , GI bleed, Respiratory Failure, possible MS  History of Present Illness:  Patient is a 61 y/o male who presented to [facility] ED  on 09/04/21 with dark stools, dizziness and SOB. Found to have GI bleed and acute anemia s/p embolization 09/06/21 and percutaneous cholecystostomy 09/07/21. Intubated 9/3, CRRT 9/5-9/8. Hemorrhagic and septic shock 9/5. Trach placed 9/11. 9/16 MRI showed rapid development numerous round white matter lesions in both cerebral hemispheres; 9/17 MRI with  numerous white matter lesions consistent with active demyelination - concerning for MS, ADEM, and cerebral vasculitis; On  9/18, Pt underwent lumbar puncture. Pt returned to ICU on 9/29 with aspiration PNA, tachypnea, tachycardia and increased WOB. On 10/2,  pt transferred back to hospitalist service but started having GIB. Underwent sigmoidoscopy on 10/07/2021. Plasma exchange started. Pt decanulated. Son 10/19. He also underwent cholecystostomy tube exchange 10/24/21. PT/OT/SLP all saw Pt. And recommend CIR to assist return to PLOF.     Patient's medical record from [facility] has been reviewed by the rehabilitation admission coordinator and physician.  Past Medical History  Past Medical History:  Diagnosis Date   Anemia    Arthritis    CVA (cerebral vascular accident) ([place])    Diabetes mellitus without complication ([place])    Hyperlipidemia    Hypertension    Kidney stones     Has the patient had major surgery during 100 days prior to admission? Yes  Family History   family history includes Cancer in an other family member; Diabetes in his father; Heart attack in an other family member; Heart failure in his mother.  Current Medications  Current Facility-Administered Medications:    0.9 %  sodium chloride infusion, , Intravenous, PRN, [name], MD, Stopped at 10/07/21 1454   acetaminophen (TYLENOL) tablet 650 mg, 650 mg, Oral, Q6H PRN, Eulogio Bear U, DO, 650 mg at 10/30/21 [id number]  Chlorhexidine Gluconate Cloth 2 % PADS 6 each, 6 each, Topical, Daily, Kathie Dike, MD, 6 each at 10/29/21 4196   docusate sodium (COLACE) capsule 100 mg, 100 mg, Oral, BID, Darliss Cheney, MD, 100 mg at 10/28/21 2229   doxazosin (CARDURA) tablet 2 mg, 2 mg, Oral, Daily, Jennye Boroughs, MD, 2 mg at 10/30/21 1013   enoxaparin (LOVENOX) injection 40 mg, 40 mg, Subcutaneous, Q24H, Dimple Nanas, RPH, 40 mg at 10/29/21 1409   feeding supplement (GLUCERNA SHAKE) (GLUCERNA SHAKE)  liquid 237 mL, 237 mL, Oral, TID BM, Jennye Boroughs, MD, 237 mL at 10/29/21 2050   food thickener (SIMPLYTHICK (NECTAR/LEVEL 2/MILDLY THICK)) 10 packet, 10 packet, Oral, PRN, Darliss Cheney, MD   hydrALAZINE (APRESOLINE) injection 10 mg, 10 mg, Intravenous, Q6H PRN, Laqueta Jean, MD   influenza vac split quadrivalent PF (FLUARIX) injection 0.5 mL, 0.5 mL, Intramuscular, Tomorrow-1000, Pokhrel, Laxman, MD   insulin aspart (novoLOG) injection 0-5 Units, 0-5 Units, Subcutaneous, QHS, Vann, Jessica U, DO, 2 Units at 10/29/21 2100   insulin aspart (novoLOG) injection 0-9 Units, 0-9 Units, Subcutaneous, TID WC, Vann, Jessica U, DO, 5 Units at 10/29/21 1710   insulin glargine-yfgn (SEMGLEE) injection 15 Units, 15 Units, Subcutaneous, BID, Jennye Boroughs, MD, 15 Units at 10/29/21 2100   lidocaine (LIDODERM) 5 % 1 patch, 1 patch, Transdermal, Q24H, Vann, Jessica U, DO, 1 patch at 10/29/21 0913   ondansetron (ZOFRAN) tablet 4 mg, 4 mg, Oral, Q6H PRN **OR** ondansetron (ZOFRAN) injection 4 mg, 4 mg, Intravenous, Q6H PRN, Kathie Dike, MD, 4 mg at 10/26/21 0415   Oral care mouth rinse, 15 mL, Mouth Rinse, 4 times per day, Freddi Starr, MD, 15 mL at 10/29/21 2100   Oral care mouth rinse, 15 mL, Mouth Rinse, PRN, Freddi Starr, MD   pantoprazole (PROTONIX) 2 mg/mL oral suspension 40 mg, 40 mg, Oral, Daily, Vann, Jessica U, DO, 40 mg at 10/30/21 1012   pneumococcal 23 valent vaccine (PNEUMOVAX-23) injection 0.5 mL, 0.5 mL, Intramuscular, Tomorrow-1000, Pokhrel, Laxman, MD   polyethylene glycol (MIRALAX / GLYCOLAX) packet 17 g, 17 g, Oral, Daily, Jennye Boroughs, MD, 17 g at 10/28/21 0828   rosuvastatin (CRESTOR) tablet 40 mg, 40 mg, Oral, Daily, Jennye Boroughs, MD, 40 mg at 10/30/21 1015   sodium chloride flush (NS) 0.9 % injection 10-40 mL, 10-40 mL, Intracatheter, Q12H, Spero Geralds, MD, 10 mL at 10/29/21 2100   sodium chloride flush (NS) 0.9 % injection 10-40 mL, 10-40 mL, Intracatheter,  PRN, Spero Geralds, MD, 10 mL at 10/26/21 2145   Zinc Oxide (TRIPLE PASTE) 12.8 % ointment, , Topical, BID, Margaretha Seeds, MD, Given at 10/29/21 2100  Patients Current Diet:  Diet Order             DIET DYS 3 Room service appropriate? Yes; Fluid consistency: Nectar Thick  Diet effective now                   Precautions / Restrictions Precautions Precautions: Fall Precaution Comments: R drain Restrictions Weight Bearing Restrictions: No Other Position/Activity Restrictions: pt unable to sit on bottom for extended periods of time due to pain (even on geomat)   Has the patient had 2 or more falls or a fall with injury in the past year? No  Prior Activity Level Community (5-7x/wk): Pt active in the community PTA  Prior Functional Level Self Care: Did the patient need help bathing, dressing, using the toilet or eating? Independent  Indoor Mobility: Did  the patient need assistance with walking from room to room (with or without device)? Independent  Stairs: Did the patient need assistance with internal or external stairs (with or without device)? Independent  Functional Cognition: Did the patient need help planning regular tasks such as shopping or remembering to take medications? Independent  Patient Information Are you of Hispanic, Latino/a,or Spanish origin?: A. No, not of Hispanic, Latino/a, or Spanish origin What is your race?: A. White Do you need or want an interpreter to communicate with a doctor or health care staff?: 0. No  Patient's Response To:  Health Literacy and Transportation Is the patient able to respond to health literacy and transportation needs?: Yes Health Literacy - How often do you need to have someone help you when you read instructions, pamphlets, or other written material from your doctor or pharmacy?: Never In the past 12 months, has lack of transportation kept you from medical appointments or from getting medications?: No In the past 12  months, has lack of transportation kept you from meetings, work, or from getting things needed for daily living?: No  Development worker, international aid / [place] Devices/Equipment: CBG Meter Home Equipment: Wheelchair - manual  Prior Device Use: Indicate devices/aids used by the patient prior to current illness, exacerbation or injury? None of the above  Current Functional Level Cognition  Overall Cognitive Status: Impaired/Different from baseline Difficult to assess due to: Tracheostomy, Impaired communication Current Attention Level: Selective Orientation Level: Oriented to person, Oriented to place, Oriented to situation, Disoriented to time Following Commands: Follows one step commands with increased time, Follows one step commands inconsistently Safety/Judgement: Decreased awareness of safety, Decreased awareness of deficits General Comments: requires hand over hand to manage his use of UE's with transfers to walker    Extremity Assessment (includes Sensation/Coordination)  Upper Extremity Assessment: Generalized weakness RUE Deficits / Details: reached to nose and maintained grasp on chair in front of him in sitting, propped on R arm in sitting, raised arm above head in supine and waved hand spontaneously LUE Deficits / Details: pt able to use LUE for balance with RW  Lower Extremity Assessment: Defer to PT evaluation RLE Deficits / Details: Able to wiggle toes, no other active movement noted to command, spontaneous movement noted extending in bed LLE Deficits / Details: Not able to wiggle toes, no active movement observed during eval    ADLs  Overall ADL's : Needs assistance/impaired Eating/Feeding: Total assistance, Bed level Eating/Feeding Details (indicate cue type and reason): can bring finger foods and cup to mouth Grooming: Wash/dry face, Moderate assistance, Sitting Grooming Details (indicate cue type and reason): required mod assist to initate washing face and was  able to continue without assistance Upper Body Dressing : Moderate assistance, Sitting Upper Body Dressing Details (indicate cue type and reason): to donn gown to cover back Lower Body Dressing: Bed level, Maximal assistance Lower Body Dressing Details (indicate cue type and reason): to donn socks Toilet Transfer: Maximal assistance, +2 for physical assistance Functional mobility during ADLs: +2 for physical assistance, Minimal assistance, Rolling walker (2 wheels), Cueing for safety, Cueing for sequencing General ADL Comments: focused on bed mobility and transfers    Mobility  Overal bed mobility: Needs Assistance Bed Mobility: Supine to Sit, Sit to Supine Rolling: Mod assist Sidelying to sit: Max assist, +2 for safety/equipment, +2 for physical assistance, HOB elevated Supine to sit: Max assist Sit to supine: Max assist (.) Sit to sidelying: +2 for physical assistance, Max assist General bed mobility  comments: requires repetitive attempts as PT is starting him to side of bed alone    Transfers  Overall transfer level: Needs assistance Equipment used: Rolling walker (2 wheels) Transfers: Sit to/from Stand Sit to Stand: Min assist, +2 physical assistance, +2 safety/equipment Bed to/from chair/wheelchair/BSC transfer type:: Via Public house manager via Lift Equipment: [employer] transfer comment: verbal and tactile cues for hand placement to stand and sit.  Assitance managing RW    Ambulation / Gait / Stairs / Wheelchair Mobility  Ambulation/Gait Ambulation/Gait assistance: Min assist, +2 physical assistance, +2 safety/equipment, Mod assist Gait Distance (Feet): 95 Feet (35+60) Assistive device: Rolling walker (2 wheels), 2 person hand held assist, Pushed wheelchair Gait Pattern/deviations: Decreased stride length, Wide base of support, Step-through pattern General Gait Details: 2 min to occas mod when LLE buckles, and has close chair follow to avoid a complete fall Gait  velocity: decreased Gait velocity interpretation: <1.31 ft/sec, indicative of household ambulator Pre-gait activities: standing balance correction    Posture / Balance Dynamic Sitting Balance Sitting balance - Comments: prefers UE support Balance Overall balance assessment: Needs assistance Sitting-balance support: Feet supported Sitting balance-Leahy Scale: Fair Sitting balance - Comments: prefers UE support Postural control: Right lateral lean Standing balance support: Bilateral upper extremity supported, During functional activity Standing balance-Leahy Scale: Poor Standing balance comment: Requires UE support in standing.    Special needs/care consideration None   Previous Home Environment (from acute therapy documentation) Living Arrangements: Spouse/significant other Available Help at Discharge: Family, Available PRN/intermittently Type of Home: House Home Layout: Two level, Laundry or work area in basement Alternate Level Stairs-Rails: Left Alternate Level Stairs-Number of Steps: chair lift down to basement Home Access: Stairs to enter Entrance Stairs-Rails: None Technical brewer of Steps: 1 Bathroom Shower/Tub: Gaffer, Chiropodist: Handicapped height Bathroom Accessibility: Yes [place]: No  Discharge Living Setting Plans for Discharge Living Setting: Patient's home Type of Home at Discharge: House Discharge [place]: One level Discharge Home Access: Stairs to enter Entrance Stairs-Rails: None Entrance Stairs-Number of Steps: 1 Discharge Bathroom Shower/Tub: Walk-in shower Discharge Bathroom Toilet: Standard Discharge Bathroom Accessibility: No Does the patient have any problems obtaining your medications?: No  Social/Family/Support Systems Patient Roles: Spouse Contact Information: [phone] Anticipated Caregiver: Windell Musson Ability/Limitations of Caregiver: Min A Caregiver Availability: 24/7 Discharge  Plan Discussed with Primary Caregiver: Yes Is Caregiver In Agreement with Plan?: Yes Does Caregiver/Family have Issues with Lodging/Transportation while Pt is in Rehab?: No  Goals Patient/Family Goal for Rehab: PT/OT/SLP Supervision Expected length of stay: 13-16 days Pt/Family Agrees to Admission and willing to participate: Yes Program Orientation Provided & Reviewed with Pt/Caregiver Including Roles  & Responsibilities: Yes  Decrease burden of Care through IP rehab admission: not anticipated  Possible need for SNF placement upon discharge: not anticipated  Patient Condition: I have reviewed medical records from [facility], spoken with CM, and patient and spouse. I met with patient at the bedside for inpatient rehabilitation assessment.  Patient will benefit from ongoing PT and OT, can actively participate in 3 hours of therapy a day 5 days of the week, and can make measurable gains during the admission.  Patient will also benefit from the coordinated team approach during an Inpatient Acute Rehabilitation admission.  The patient will receive intensive therapy as well as Rehabilitation physician, nursing, social worker, and care management interventions.  Due to safety, skin/wound care, disease management, medication administration, pain management, and patient education the patient requires 24 hour a  day rehabilitation nursing.  The patient is currently min to max assist with mobility and basic ADLs.  Discharge setting and therapy post discharge at home with home health is anticipated.  Patient has agreed to participate in the Acute Inpatient Rehabilitation Program and will admit today.  Preadmission Screen Completed By:  Retta Diones, 10/30/2021 10:17 AM ______________________________________________________________________   Discussed status with Dr. Dagoberto Ligas on 10/30/21 at 0945 and received approval for admission today.  Admission Coordinator:  Retta Diones, RN, time  1016/Date 10/30/21   Assessment/Plan: Diagnosis: Does the need for close, 24 hr/day Medical supervision in concert with the patient's rehab needs make it unreasonable for this patient to be served in a less intensive setting? Yes Co-Morbidities requiring supervision/potential complications: probable new dx of MS; GI bleed s/p 19 units pRBCs; percutaneous chole drain; decnanulated trach; urinary retention- foley Due to bladder management, bowel management, safety, skin/wound care, disease management, medication administration, pain management, and patient education, does the patient require 24 hr/day rehab nursing? Yes Does the patient require coordinated care of a physician, rehab nurse, PT, OT, and SLP to address physical and functional deficits in the context of the above medical diagnosis(es)? Yes Addressing deficits in the following areas: balance, endurance, locomotion, strength, transferring, bowel/bladder control, bathing, dressing, feeding, grooming, toileting, cognition, speech, language, and swallowing Can the patient actively participate in an intensive therapy program of at least 3 hrs of therapy 5 days a week? Yes The potential for patient to make measurable gains while on inpatient rehab is good and fair Anticipated functional outcomes upon discharge from inpatient rehab: supervision PT, supervision OT, supervision and min assist SLP Estimated rehab length of stay to reach the above functional goals is: 13-16 days Anticipated discharge destination: Home 10. Overall Rehab/Functional Prognosis: good and fair   MD Signature:

## 2021-10-28 NOTE — Progress Notes (Signed)
Progress Note    Troy Randall  [id number] DOB: 10/11/1960  DOA: 09/04/2021 PCP: [name], MD      Brief Narrative:    Medical records reviewed and are as summarized below:  Troy Randall is a 61 y.o. male with medical history of hypertension, hyperlipidemia, type 2 diabetes, CVA, presented to the hospital with GI bleed.  Patient underwent IR embolization followed by percutaneous cholecystostomy tube placement due to ongoing altered mental status.  MRI of the brain showed numerous contrast-enhancing bilateral white matter lesions concerning for demyelinating disease.  Neurology was consulted and underwent lumbar puncture on 09/22/2021 with low suspicion for infectious etiology.  Neurology has initiated the patient on high-dose steroids on 09/23/2021.  See other events below.  Significant events. 8/31 presented to [facility], [name] Admit, GI Consult 9/1 CTA> no evidence of active GI bleed, 2 U PRBC 9/2 2 U PRBC, colonoscopy > no active bleed found but large clots throughout entire colon. EGD> small hiatal hernia, erosive gastropathy with no stigmata of recent bleeding, and non-bleeding duodenal diverticulum.   Underwent then small bowel enteroscopy which noted intermittent blood pumping in the proximal jejunum but was unable to be reached despite multiple attempts, was injected and clip placed. Repeat CTA repeated, which showed positive jejunal diverticular bleeding. IR> active extrav at [facility] territory, jejunal arcade branch, successful coil embo 9/3 Intubated. CTA> no evidence of active GI bleeding.  New finding of abnormal gallbladder with nondependent air, gallbladder wall air, pericholecystic and right upper quadrant information.  IR placed percutaneous cholecystotomy tube.  9/4 R. IJ central line placed with dialysis triple lumen catheter 9/5 CRRT initiated 9/6 Bedside EGD without any evidence of bleeding. CTA a/p without etiology for bleed 9/8 CRRT discontinued. Required ETT  exchange yesterday due to blown cuff. 9/11 Bronchoscopic guided percutaneous tracheostomy placement.  Right IJ central line removed. 9/15 flexible sigmoidoscopy with removal of endoscopy 9/16 MRI brain w/o contrast amended study secondary to range of motion.  Rapid development numerous round white matter lesions in both cerebral hemispheres.  Prior MRIs. 9/17 MRI brain: Numerous contrast-enhancing bilateral white matter lesions, consistent with active demyelination. 9/18 Lumbar puncture  9/19 MRI cervical spine, thoracic : Limited exam, within limitations there is not definitive evidence of active demyelinization. Within limitations of motion artifact, there may be demyelinating lesions at the C6 and T1 vertebral body levels. 9/25-clinical exam not improved despite completion of 5-day IV steroids.  Neurology planning to repeat MRI of the brain cervical spine thoracic spine. Possible IR consult for catheter placement for possible Plex therapy if abnormal scans 9/29-patient transferred to ICU for respiratory distress due to aspiration pneumonia with sepsis 10/2-patient transferred back to [facility]; started having GI bleed, Dr. Mann/Dr. Benson Norway reconsulted.  Underwent sigmoidoscopy on 10/07/2021.  Plasma exchange started Currently awaiting placement CIR vs SNF       Assessment/Plan:   Principal Problem:   Acute metabolic encephalopathy Active Problems:   Uncontrolled type 2 diabetes mellitus with hyperglycemia, without long-term current use of insulin (HCC)   Essential hypertension   Hyperlipidemia   Chronic respiratory failure with hypoxia (HCC)   Tracheostomy status (HCC)   Anemia due to blood loss   History of lower GI bleeding: 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    H/O ischemic bowel disease   Acute emphysematous cholecystitis   Pressure injury of skin   Acute respiratory insufficiency   obesity Body mass index is 36.57 kg/m.  Acute metabolic encephalopathy -MRI  brain showed bilateral white matter lesions concerning for active demyelinating disease -S/p IV steroids for 5 days with no significant improvement He underwent plasma exchange.  He had 5 doses and last dose was on 10/15/2021.  No further recommendations from neurology other than rehabilitation.   Hemorrhagic shock/GI bleed -Patient has had multiple endoscopic procedures last month including EGD/capsule endoscopy/coil embolization of jejunal branch of SMA. -Possibly from stress ulceration/ischemic disease as per GI: Status post sigmoidoscopy on 10/07/2021 which showed single solitary ulcer in the sigmoid colon which was biopsied and pathology shows chronic colitis. -Has had multiple packed red cell transfusion during this hospitalization. -Monitor H&H.  Transfuse packed red cells if hemoglobin is less than 7.   Acute respiratory failure with hypoxia status post tracheostomy Aspiration pneumonia -Status post tracheostomy placement on 09/15/2020.  -Patient had acute aspiration event on 10/03/2021 and was placed on broad-spectrum antibiotics: Trach cultures grew MRSA and E. coli.  Treated with Rocephin and vancomycin and completed therapy.   Tracheostomy was successfully decannulated 10/23/2021.   E. coli cholecystitis status post percutaneous cholecystostomy drain placement -Had percutaneous cholecystostomy drain placement on 09/07/2021.  Subsequently had completed course of antibiotics. Cholecystostomy tube was exchanged by IR on 10/24/2021. -Will eventually need outpatient surgery/IR follow-up once improved   Hypokalemia, hypophosphatemia, elevated liver enzymes, thrombocytopenia, AKI: Resolved. CCRT was discontinued on 09/12/2021.  Diabetes mellitus type 2 with hyperglycemia: Decrease Semglee from 20 units back to 15 units twice daily  Hypertension -Continue monitoring blood pressure.  Currently on doxazosin      Stage II medial sacral ulcer: Not present on admission -Continue local wound  care.   Acute urinary retention -Foley catheter removed on 10/13/2021 but Foley catheter was reinserted on 10/14/2021. Continue doxazosin -voiding trial when more mobile   Moderate protein calorie malnutrition/poor p.o. intake/dysphagia:  -tolerating diet  Hypokalemia, hypophosphatemia, elevated liver enzymes, thrombocytopenia, AKI: Resolved. CCRT was discontinued on 09/12/2021.    Diet Order             DIET DYS 2 Room service appropriate? Yes; Fluid consistency: Nectar Thick  Diet effective now                      Consultants: Gastroenterologist, neurologist, intensivist nephrologist, general surgeon, interventional radiologist      Medications:    Chlorhexidine Gluconate Cloth  6 each Topical Daily   docusate sodium  100 mg Oral BID   doxazosin  2 mg Oral Daily   enoxaparin (LOVENOX) injection  40 mg Subcutaneous Q24H   feeding supplement (GLUCERNA SHAKE)  237 mL Oral TID BM   influenza vac split quadrivalent PF  0.5 mL Intramuscular Tomorrow-1000   insulin aspart  0-5 Units Subcutaneous QHS   insulin aspart  0-9 Units Subcutaneous TID WC   insulin glargine-yfgn  15 Units Subcutaneous BID   lidocaine  1 patch Transdermal Q24H   mouth rinse  15 mL Mouth Rinse 4 times per day   pantoprazole  40 mg Oral Daily   pneumococcal 23 valent vaccine  0.5 mL Intramuscular Tomorrow-1000   polyethylene glycol  17 g Oral Daily   rosuvastatin  40 mg Oral Daily   sodium chloride flush  10-40 mL Intracatheter Q12H   Zinc Oxide   Topical BID   Continuous Infusions:  sodium chloride Stopped (10/07/21 1454)      Family Communication/Anticipated D/C date and plan/Code Status   DVT prophylaxis: enoxaparin (LOVENOX) injection 40 mg Start: 10/21/21 1415 Place  and maintain sequential compression device Start: 10/07/21 2222 Place and maintain sequential compression device Start: 09/26/21 1759     Code Status: Full Code  Family Communication: Plan discussed with his wife  at the bedside Disposition Plan: Plan to discharge to SNF/cir when bed available          Subjective:   Patient with no currently complaints, ate well   Objective:    Vitals:   10/27/21 2012 10/28/21 0436 10/28/21 0455 10/28/21 0839  BP: 123/72 126/72  135/72  Pulse: (!) 109 97  77  Resp: 18 18  18   Temp: 98.2 F (36.8 C) 98.5 F (36.9 C)  98.7 F (37.1 C)  TempSrc:      SpO2: 98% 97%  100%  Weight:   122.3 kg   Height:       No data found.   Intake/Output Summary (Last 24 hours) at 10/28/2021 1223 Last data filed at 10/28/2021 1038 Gross per 24 hour  Intake --  Output 525 ml  Net -525 ml   Filed Weights   10/15/21 1344 10/15/21 1625 10/28/21 0455  Weight: 125.4 kg 125.4 kg 122.3 kg    Exam:    General: Appearance:    Obese male in no acute distress     Lungs:     respirations unlabored  Heart:    Normal heart rate. Normal rhythm. No murmurs, rubs, or gallops.   MS:   All extremities are intact.   Neurologic:   Awake, alert         Pressure Injury 10/06/21 Sacrum Medial Stage 2 -  Partial thickness loss of dermis presenting as a shallow open injury with a red, pink wound bed without slough. (Active)  10/06/21 2000  Location: Sacrum  Location Orientation: Medial  Staging: Stage 2 -  Partial thickness loss of dermis presenting as a shallow open injury with a red, pink wound bed without slough.  Wound Description (Comments):   Present on Admission:   Dressing Type Foam - Lift dressing to assess site every shift 10/27/21 2130     Data Reviewed:   I have personally reviewed following labs and imaging studies:  Labs: Labs show the following:   Basic Metabolic Panel: Recent Labs  Lab 10/22/21 0437 10/23/21 0458 10/24/21 0404 10/25/21 0300 10/26/21 0421 10/27/21 0338  NA  --   --   --  140  --  143  K  --   --   --  3.6  --  3.8  CL  --   --   --  104  --  109  CO2  --   --   --  24  --  27  GLUCOSE  --   --   --  115*  --  130*   BUN  --   --   --  9  --  11  CREATININE  --   --   --  0.68  --  0.80  CALCIUM  --   --   --  9.0  --  8.9  MG 2.0 2.1 2.1 2.0 2.0  --   PHOS 3.1 4.1 4.5 4.0 3.8  --    GFR Estimated Creatinine Clearance: 134.3 mL/min (by C-G formula based on SCr of 0.8 mg/dL). Liver Function Tests: No results for input(s): "AST", "ALT", "ALKPHOS", "BILITOT", "PROT", "ALBUMIN" in the last 168 hours. No results for input(s): "LIPASE", "AMYLASE" in the last 168 hours. No results for input(s): "AMMONIA" in the  last 168 hours. Coagulation profile No results for input(s): "INR", "PROTIME" in the last 168 hours.  CBC: Recent Labs  Lab 10/22/21 0437 10/23/21 0458 10/24/21 0404 10/25/21 0300 10/27/21 0338  WBC 6.5 5.5 5.7 6.5 10.4  NEUTROABS 4.2 3.3 3.2 4.2  --   HGB 8.4* 8.7* 8.8* 9.3* 9.3*  HCT 26.7* 28.5* 28.2* 30.3* 30.8*  MCV 91.8 91.9 91.0 91.3 92.8  PLT 249 259 271 295 288   Cardiac Enzymes: No results for input(s): "CKTOTAL", "CKMB", "CKMBINDEX", "TROPONINI" in the last 168 hours. BNP (last 3 results) No results for input(s): "PROBNP" in the last 8760 hours. CBG: Recent Labs  Lab 10/27/21 2012 10/27/21 2347 10/28/21 0436 10/28/21 0835 10/28/21 1158  GLUCAP 220* 218* 174* 204* 220*   D-Dimer: No results for input(s): "DDIMER" in the last 72 hours. Hgb A1c: Recent Labs    10/27/21 0338  HGBA1C 5.1   Lipid Profile: No results for input(s): "CHOL", "HDL", "LDLCALC", "TRIG", "CHOLHDL", "LDLDIRECT" in the last 72 hours. Thyroid function studies: No results for input(s): "TSH", "T4TOTAL", "T3FREE", "THYROIDAB" in the last 72 hours.  Invalid input(s): "FREET3" Anemia work up: No results for input(s): "VITAMINB12", "FOLATE", "FERRITIN", "TIBC", "IRON", "RETICCTPCT" in the last 72 hours. Sepsis Labs: Recent Labs  Lab 10/23/21 0458 10/24/21 0404 10/25/21 0300 10/27/21 0338  WBC 5.5 5.7 6.5 10.4    Microbiology No results found for this or any previous visit (from the  past 240 hour(s)).  Procedures and diagnostic studies:  No results found.      LOS: 9 days   Geradine Girt  [facility]   Pager on [email or web address]. If 7PM-7AM, please contact night-coverage at [email or web address]     10/28/2021, 12:23 PM

## 2021-10-29 DIAGNOSIS — G9341 Metabolic encephalopathy: Secondary | ICD-10-CM | POA: Diagnosis not present

## 2021-10-29 LAB — GLUCOSE, CAPILLARY
Glucose-Capillary: 169 mg/dL — ABNORMAL HIGH (ref 70–99)
Glucose-Capillary: 201 mg/dL — ABNORMAL HIGH (ref 70–99)
Glucose-Capillary: 282 mg/dL — ABNORMAL HIGH (ref 70–99)
Glucose-Capillary: 224 mg/dL — ABNORMAL HIGH (ref 70–99)

## 2021-10-29 NOTE — Plan of Care (Signed)
  Problem: Education: Goal: Knowledge of General Education information will improve Description: Including pain rating scale, medication(s)/side effects and non-pharmacologic comfort measures Outcome: Progressing   Problem: Health Behavior/Discharge Planning: Goal: Ability to manage health-related needs will improve Outcome: Progressing   Problem: Clinical Measurements: Goal: Ability to maintain clinical measurements within normal limits will improve Outcome: Progressing Goal: Will remain free from infection Outcome: Progressing Goal: Diagnostic test results will improve Outcome: Progressing Goal: Respiratory complications will improve Outcome: Progressing Goal: Cardiovascular complication will be avoided Outcome: Progressing   Problem: Activity: Goal: Risk for activity intolerance will decrease Outcome: Progressing   Problem: Nutrition: Goal: Adequate nutrition will be maintained Outcome: Progressing   Problem: Coping: Goal: Level of anxiety will decrease Outcome: Progressing   Problem: Elimination: Goal: Will not experience complications related to bowel motility Outcome: Progressing Goal: Will not experience complications related to urinary retention Outcome: Progressing   Problem: Pain Managment: Goal: General experience of comfort will improve Outcome: Progressing   Problem: Safety: Goal: Ability to remain free from injury will improve Outcome: Progressing   Problem: Skin Integrity: Goal: Risk for impaired skin integrity will decrease Outcome: Progressing   Problem: Education: Goal: Ability to describe self-care measures that may prevent or decrease complications (Diabetes Survival Skills Education) will improve Outcome: Progressing Goal: Individualized Educational Video(s) Outcome: Progressing   Problem: Coping: Goal: Ability to adjust to condition or change in health will improve Outcome: Progressing   Problem: Fluid Volume: Goal: Ability to  maintain a balanced intake and output will improve Outcome: Progressing   Problem: Health Behavior/Discharge Planning: Goal: Ability to identify and utilize available resources and services will improve Outcome: Progressing Goal: Ability to manage health-related needs will improve Outcome: Progressing   Problem: Metabolic: Goal: Ability to maintain appropriate glucose levels will improve Outcome: Progressing   Problem: Nutritional: Goal: Maintenance of adequate nutrition will improve Outcome: Progressing Goal: Progress toward achieving an optimal weight will improve Outcome: Progressing   Problem: Skin Integrity: Goal: Risk for impaired skin integrity will decrease Outcome: Progressing   Problem: Tissue Perfusion: Goal: Adequacy of tissue perfusion will improve Outcome: Progressing   Problem: Education: Goal: Understanding of CV disease, CV risk reduction, and recovery process will improve Outcome: Progressing Goal: Individualized Educational Video(s) Outcome: Progressing   Problem: Activity: Goal: Ability to return to baseline activity level will improve Outcome: Progressing   Problem: Cardiovascular: Goal: Ability to achieve and maintain adequate cardiovascular perfusion will improve Outcome: Progressing Goal: Vascular access site(s) Level 0-1 will be maintained Outcome: Progressing   Problem: Health Behavior/Discharge Planning: Goal: Ability to safely manage health-related needs after discharge will improve Outcome: Progressing   Problem: Education: Goal: Knowledge about tracheostomy care/management will improve Outcome: Progressing   Problem: Activity: Goal: Ability to tolerate increased activity will improve Outcome: Progressing   Problem: Health Behavior/Discharge Planning: Goal: Ability to manage tracheostomy will improve Outcome: Progressing   Problem: Respiratory: Goal: Patent airway maintenance will improve Outcome: Progressing   Problem: Role  Relationship: Goal: Ability to communicate will improve Outcome: Progressing   Problem: Fluid Volume: Goal: Hemodynamic stability will improve Outcome: Progressing   Problem: Clinical Measurements: Goal: Diagnostic test results will improve Outcome: Progressing Goal: Signs and symptoms of infection will decrease Outcome: Progressing   Problem: Respiratory: Goal: Ability to maintain adequate ventilation will improve Outcome: Progressing

## 2021-10-29 NOTE — Progress Notes (Signed)
Occupational Therapy Treatment Patient Details Name: Troy Randall MRN: [id number] DOB: 1960-08-18 Today's Date: 10/29/2021   History of present illness Patient is a 61 y/o male who presents on 09/04/21 with dark stools, dizziness and SOB. Found to have GI bleed and acute anemia s/p embolization 09/06/21 and percutaneous cholecystostomy 09/07/21. Intubated 9/3, CRRT 9/5-9/8. Hemorrhagic and septic shock 9/5. Trach placed 9/11. 9/16 MRI rapid development numerous round white matter lesions in both cerebral hemispheres; 9/17 MRI: numerous white matter lesions consistent with active demyelination - concerning for MS, ADEM, and cerebral vasculitis; 9/18 Lumbar puncture. Pt returned to ICU on 9/29 with aspiration PNA, tachypnea, tachycardia and increased WOB. 10/2 - pt transferred back to [facility]; started having GIB. Underwent sigmoidoscopy on 10/07/2021. Plasma exchange started. 10/17 trach capped. 10/19 decanulated. s/p Cholecystostomy tube exchange 10/24/21. PMH includes CVA, DM, HTN.   OT comments  Patient seated on EOB with PT upon entry. Patient required verbal and tactile cues for hand placement to stand and min assist +2.  Patient performed mobility and transfer training with RW requiring assistance for walker management.  Patient has episodes of knee buckling but was able to correct.  Patient returned to EOB at end of session with min assist +2 and max verbal cues to return to supine. Patient continues to make good gains with therapy. Discharge recommendations continue to be appropriate.    Recommendations for follow up therapy are one component of a multi-disciplinary discharge planning process, led by the attending physician.  Recommendations may be updated based on patient status, additional functional criteria and insurance authorization.    Follow Up Recommendations  Acute inpatient rehab (3hours/day)    Assistance Recommended at Discharge Frequent or constant Supervision/Assistance  Patient can  return home with the following  Two people to help with walking and/or transfers;A lot of help with bathing/dressing/bathroom;Assistance with feeding;Assistance with cooking/housework;Direct supervision/assist for medications management;Direct supervision/assist for financial management;Assist for transportation;Help with stairs or ramp for entrance   Equipment Recommendations  BSC/3in1;Wheelchair (measurements OT);Wheelchair cushion (measurements OT);Hospital bed    Recommendations for Other Services      Precautions / Restrictions Precautions Precautions: Fall Precaution Comments: R drain Restrictions Weight Bearing Restrictions: No       Mobility Bed Mobility Overal bed mobility: Needs Assistance Bed Mobility: Sit to Supine       Sit to supine: Mod assist, Min assist, +2 for physical assistance   General bed mobility comments: frequent cueing on hand placement and technique    Transfers Overall transfer level: Needs assistance Equipment used: Rolling walker (2 wheels) Transfers: Sit to/from Stand Sit to Stand: Min assist, +2 physical assistance           General transfer comment: verbal and tactile cues for hand placement to stand and sit.  Assitance managing RW     Balance Overall balance assessment: Needs assistance Sitting-balance support: Feet supported, Single extremity supported Sitting balance-Leahy Scale: Fair     Standing balance support: During functional activity, Bilateral upper extremity supported, Reliant on assistive device for balance Standing balance-Leahy Scale: Poor Standing balance comment: Requires UE support in standing.                           ADL either performed or assessed with clinical judgement   ADL Overall ADL's : Needs assistance/impaired  General ADL Comments: focused on bed mobility and transfers    Extremity/Trunk Assessment              Vision        Perception     Praxis      Cognition                                                Exercises      Shoulder Instructions       General Comments pt is slow to respond to cues for sequencing standing and has poor uptake of information about standing balance safety and using walker safely    Pertinent Vitals/ Pain       Pain Assessment Pain Intervention(s): Monitored during session  Home Living                                          Prior Functioning/Environment              Frequency  Min 2X/week        Progress Toward Goals  OT Goals(current goals can now be found in the care plan section)  Progress towards OT goals: Progressing toward goals  Acute Rehab OT Goals OT Goal Formulation: With family Time For Goal Achievement: 10/30/21 Potential to Achieve Goals: Fair ADL Goals Pt/caregiver will Perform Home Exercise Program: Increased strength;Both right and left upper extremity;With written HEP provided;With Supervision Additional ADL Goal #1: Pt will follow one step commands within 10 seconds with 75% accuracy Additional ADL Goal #2: Pt will perform bed mobility with Min A + 2 in preparation for ADLs Additional ADL Goal #3: Pt will roll with Min A for pressure relief and pericare.  Plan Discharge plan remains appropriate    Co-evaluation    PT/OT/SLP Co-Evaluation/Treatment: Yes Reason for Co-Treatment: Necessary to address cognition/behavior during functional activity;Complexity of the patient's impairments (multi-system involvement);For patient/therapist safety (Simultaneous filing. User may not have seen previous data.) PT goals addressed during session: Mobility/safety with mobility;Balance;Proper use of DME OT goals addressed during session: Strengthening/ROM      AM-PAC OT "6 Clicks" Daily Activity     Outcome Measure   Help from another person eating meals?: A Lot Help from another person taking care  of personal grooming?: A Lot Help from another person toileting, which includes using toliet, bedpan, or urinal?: Total Help from another person bathing (including washing, rinsing, drying)?: Total Help from another person to put on and taking off regular upper body clothing?: A Lot Help from another person to put on and taking off regular lower body clothing?: A Lot 6 Click Score: 10    End of Session Equipment Utilized During Treatment: Gait belt;Rolling walker (2 wheels)  OT Visit Diagnosis: Muscle weakness (generalized) (M62.81);Other symptoms and signs involving cognitive function;Pain;Unsteadiness on feet (R26.81)   Activity Tolerance Patient tolerated treatment well   Patient Left in bed;with call bell/phone within reach;with family/visitor present   Nurse Communication Mobility status        Time: [id number] OT Time Calculation (min): 23 min  Charges:    Lodema Hong, [place]  Office [phone]   Trixie Dredge 10/29/2021, 2:58 PM

## 2021-10-29 NOTE — Care Management Important Message (Signed)
Important Message  Patient Details  Name: Troy Randall MRN: [id number] Date of Birth: January 21, 1960   Medicare Important Message Given:  Yes     Ruthvik Barnaby 10/29/2021, 12:27 PM

## 2021-10-29 NOTE — Progress Notes (Signed)
Speech Language Pathology Treatment: Dysphagia  Patient Details Name: Troy Randall MRN: [id number] DOB: July 26, 1960 Today's Date: 10/29/2021 Time: 7893-8101 SLP Time Calculation (min) (ACUTE ONLY): 8 min  Assessment / Plan / Recommendation Clinical Impression  Observed pt with part of breakfast and assisted feeding with wife present. Pt handled cup and self fed sips nectar thick juice without s/sx aspiration. Oral phase with Dys 2 texture was functional without holding or pocketing noted. Pt and wife in agreement with diet advancement to Dys 3 (chopped meats) and continue nectar. Will recommend MBS in near future to upgrade liquids from nectar. Wife states she ensures his mouth is clear at end and gives liquids and end of meals.    HPI HPI: Pt is a 61 y.o. male with history of stroke in 4/23, HTN, HLD and DM with recent severe jejunal bleed s/p vessel coiling who is being seen for altered mental status and concerning white matter changes on MRI. Patient's wife reports that after his stroke in 4/23 he made a good recovery and was able to perform ADLs. He had some residual dysarthria. Pt presented to [facility] on 8/31 with GI bleeding and has remained persistently altered. Intubated 9/3 and trach placed 9/11. He currently has #6 cuffed trach tolerating cuff deflated.      SLP Plan  Continue with current plan of care      Recommendations for follow up therapy are one component of a multi-disciplinary discharge planning process, led by the attending physician.  Recommendations may be updated based on patient status, additional functional criteria and insurance authorization.    Recommendations  Diet recommendations: Dysphagia 3 (mechanical soft);Nectar-thick liquid Liquids provided via: Cup Medication Administration: Crushed with puree Supervision: Full supervision/cueing for compensatory strategies;Trained caregiver to feed patient Compensations: Slow rate;Small sips/bites;Lingual  sweep for clearance of pocketing;Monitor for anterior loss;Minimize environmental distractions Postural Changes and/or Swallow Maneuvers: Seated upright 90 degrees                Oral Care Recommendations: Oral care BID Follow Up Recommendations: Acute inpatient rehab (3hours/day) Assistance recommended at discharge: Frequent or constant Supervision/Assistance SLP Visit Diagnosis: Dysphagia, oropharyngeal phase (R13.12) Plan: Continue with current plan of care           Troy Randall  10/29/2021, 9:04 AM

## 2021-10-29 NOTE — Progress Notes (Signed)
Inpatient Rehab Admissions Coordinator:   Continue to await insurance auth for CIR admit. I will follow for potential admit once auth is received.   Clemens Catholic, [place], [place] Admissions Coordinator  [phone] ([place]) [phone] (office)

## 2021-10-29 NOTE — Care Management Important Message (Signed)
Important Message  Patient Details  Name: Troy Randall MRN: [id number] Date of Birth: Mar 23, 1960   Medicare Important Message Given:  Yes     Troy Randall 10/29/2021, 12:24 PM

## 2021-10-29 NOTE — Progress Notes (Signed)
PROGRESS NOTE  Troy Randall LKG:[id number] DOB: 10-Feb-1960 DOA: 09/04/2021 PCP: [name], MD   LOS: 55 days   Brief Narrative / Interim history: Troy Randall is a 61 y.o. male with medical history of hypertension, hyperlipidemia, type 2 diabetes, CVA, presented to the hospital with GI bleed.  Patient underwent IR embolization followed by percutaneous cholecystostomy tube placement due to ongoing altered mental status.  MRI of the brain showed numerous contrast-enhancing bilateral white matter lesions concerning for demyelinating disease.  Neurology was consulted and underwent lumbar puncture on 09/22/2021 with low suspicion for infectious etiology.  Neurology has initiated the patient on high-dose steroids on 09/23/2021.  See other events below.  Significant events: 8/31 presented to [facility] ED, [name] Admit, GI Consult 9/1 CTA> no evidence of active GI bleed, 2 U PRBC 9/2 2 U PRBC, colonoscopy > no active bleed found but large clots throughout entire colon. EGD> small hiatal hernia, erosive gastropathy with no stigmata of recent bleeding, and non-bleeding duodenal diverticulum.   Underwent then small bowel enteroscopy which noted intermittent blood pumping in the proximal jejunum but was unable to be reached despite multiple attempts, was injected and clip placed. Repeat CTA repeated, which showed positive jejunal diverticular bleeding. IR> active extrav at [facility] territory, jejunal arcade branch, successful coil embo 9/3 Intubated. CTA> no evidence of active GI bleeding.  New finding of abnormal gallbladder with nondependent air, gallbladder wall air, pericholecystic and right upper quadrant information.  IR placed percutaneous cholecystotomy tube.  9/4 R. IJ central line placed with dialysis triple lumen catheter 9/5 CRRT initiated 9/6 Bedside EGD without any evidence of bleeding. CTA a/p without etiology for bleed 9/8 CRRT discontinued. Required ETT exchange yesterday due to blown cuff. 9/11  Bronchoscopic guided percutaneous tracheostomy placement.  Right IJ central line removed. 9/15 flexible sigmoidoscopy with removal of endoscopy 9/16 MRI brain w/o contrast amended study secondary to range of motion.  Rapid development numerous round white matter lesions in both cerebral hemispheres.  Prior MRIs. 9/17 MRI brain: Numerous contrast-enhancing bilateral white matter lesions, consistent with active demyelination. 9/18 Lumbar puncture  9/19 MRI cervical spine, thoracic : Limited exam, within limitations there is not definitive evidence of active demyelinization. Within limitations of motion artifact, there may be demyelinating lesions at the C6 and T1 vertebral body levels. 9/25-clinical exam not improved despite completion of 5-day IV steroids.  Neurology planning to repeat MRI of the brain cervical spine thoracic spine. Possible IR consult for catheter placement for possible Plex therapy if abnormal scans 9/29-patient transferred to ICU for respiratory distress due to aspiration pneumonia with sepsis 10/2-patient transferred back to [facility]; started having GI bleed, Dr. Mann/Dr. Benson Norway reconsulted.  Underwent sigmoidoscopy on 10/07/2021.  Plasma exchange started Currently awaiting placement CIR vs SNF  Subjective / 24h Interval events: Overall doing well.  Has some low back pain which is chronic.  No chest pain, no shortness of breath.  Assesement and Plan: Principal Problem:   Acute metabolic encephalopathy Active Problems:   Uncontrolled type 2 diabetes mellitus with hyperglycemia, without long-term current use of insulin (HCC)   Essential hypertension   Hyperlipidemia   Chronic respiratory failure with hypoxia (HCC)   Tracheostomy status (HCC)   Anemia due to blood loss   History of lower GI bleeding: 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    H/O ischemic bowel disease   Acute emphysematous cholecystitis   Pressure injury of skin   Acute respiratory  insufficiency   Principal  problem Hemorrhagic shock/GI bleed -patient was initially admitted to the ICU, gastroenterology consulted and followed patient while hospitalized.  Patient has had multiple endoscopic procedures last month including EGD / capsule endoscopy /and eventually coil embolization of jejunal branch of SMA.  This was likely due to stress ulceration versus ischemic disease per gastroenterology. A sigmoidoscopy on 10/07/2021 showed single solitary ulcer in the sigmoid colon which was biopsied and pathology shows chronic colitis. -Has had multiple packed red cell transfusion during this hospitalization.  Appears to have received 19 units of packed red blood cells total -CBC tomorrow  Active problems Acute metabolic encephalopathy-due to persistent confusion he underwent an MRI of the brain, which showed bilateral white matter lesions concerning for active demyelinating disease.  Neurology consulted and followed patient while hospitalized.  It is not clear cause for his demyelinating disease.  He underwent IV steroids for 5 days, given lack of improvement underwent plasma exchange x5 doses, last on 10/11.  Following that he has started to experience gradual improvement, and neurology recommends rehab and no further interventions.  Acute respiratory failure with hypoxia status post tracheostomy, aspiration pneumonia -he has completed antibiotics for his pneumonia.  Initially underwent tracheostomy placement on 9/11, successfully decannulated 10/19.  Breathing status currently stable  E. coli cholecystitis status post percutaneous  cholecystostomy drain placement -Had percutaneous cholecystostomy drain placement on 09/07/2021. Subsequently had completed course of antibiotics. Cholecystostomy tube was exchanged by IR on 10/24/2021. -Will eventually need outpatient surgery/IR follow-up once improved   Hypokalemia, hypophosphatemia, elevated liver enzymes, thrombocytopenia, AKI -Resolved. CCRT  was discontinued on 09/12/2021.   Diabetes mellitus type 2 with hyperglycemia -continue Semglee, sliding scale  CBG (last 3)  Recent Labs    10/28/21 2118 10/29/21 0815 10/29/21 1139  GLUCAP 252* 169* 201*     Hypertension -Continue monitoring blood pressure. Currently on doxazosin    Stage II medial sacral ulcer-Not present on admission -Continue local wound care.   Acute urinary retention -Foley catheter removed on 10/13/2021 but Foley catheter was reinserted on 10/14/2021. Continue doxazosin. Voiding trial when more mobile   Moderate protein calorie malnutrition/poor p.o. intake/dysphagia -tolerating diet.  He was on an NG feeding tube which was discontinued mid-October.  Oral intake picking up  Obesity, class II-BMI 36.  He would benefit from weight loss  Scheduled Meds:  Chlorhexidine Gluconate Cloth  6 each Topical Daily   docusate sodium  100 mg Oral BID   doxazosin  2 mg Oral Daily   enoxaparin (LOVENOX) injection  40 mg Subcutaneous Q24H   feeding supplement (GLUCERNA SHAKE)  237 mL Oral TID BM   influenza vac split quadrivalent PF  0.5 mL Intramuscular Tomorrow-1000   insulin aspart  0-5 Units Subcutaneous QHS   insulin aspart  0-9 Units Subcutaneous TID WC   insulin glargine-yfgn  15 Units Subcutaneous BID   lidocaine  1 patch Transdermal Q24H   mouth rinse  15 mL Mouth Rinse 4 times per day   pantoprazole  40 mg Oral Daily   pneumococcal 23 valent vaccine  0.5 mL Intramuscular Tomorrow-1000   polyethylene glycol  17 g Oral Daily   rosuvastatin  40 mg Oral Daily   sodium chloride flush  10-40 mL Intracatheter Q12H   Zinc Oxide   Topical BID   Continuous Infusions:  sodium chloride Stopped (10/07/21 1454)   PRN Meds:.sodium chloride, acetaminophen, food thickener, hydrALAZINE, ondansetron **OR** ondansetron (ZOFRAN) IV, mouth rinse, sodium chloride flush  Current Outpatient Medications  Medication Instructions   amLODipine (NORVASC)  10 mg, Oral, Daily    aspirin EC 81 mg, Oral, Daily, Swallow whole.   BD VEO INSULIN SYRINGE U/F 31G X 15/64" 1 ML MISC  USE AS DIRECTED   clopidogrel (PLAVIX) 75 mg, Oral, Daily   glipiZIDE (GLUCOTROL XL) 10 mg, Oral, Daily with breakfast   glucose blood (ONETOUCH VERIO) test strip TEST twice a day   Insulin Pen Needle (NOVOTWIST) 32G X 5 MM MISC Use two daily to inject Victoza and Toujeo.   levocetirizine (XYZAL) 5 MG tablet 1 tablet, Oral, Daily at bedtime   lisinopril (ZESTRIL) 40 mg, Oral, Daily   metFORMIN (GLUCOPHAGE-XR) 500-1,000 mg, Oral, 2 times daily   ONETOUCH DELICA LANCETS 99991111 MISC Use to check blood sugar once a day dx code E11.65   rosuvastatin (CRESTOR) 40 mg, Oral, Daily   tamsulosin (FLOMAX) 0.4 mg, Oral, Daily   traZODone (DESYREL) 150 mg, Oral, Daily at bedtime   Tresiba FlexTouch 20 Units, Subcutaneous, 2 times daily    Diet Orders (From admission, onward)     Start     Ordered   10/29/21 0900  DIET DYS 3 Room service appropriate? Yes; Fluid consistency: Nectar Thick  Diet effective now       Question Answer Comment  Room service appropriate? Yes   Fluid consistency: Nectar Thick      10/29/21 0859            DVT prophylaxis: enoxaparin (LOVENOX) injection 40 mg Start: 10/21/21 1415 Place and maintain sequential compression device Start: 10/07/21 2222 Place and maintain sequential compression device Start: 09/26/21 1759   Lab Results  Component Value Date   PLT 288 10/27/2021      Code Status: Full Code  Family Communication: wife at bedside   Status is: Inpatient Remains inpatient appropriate because: awaiting placement  Level of care: Med-Surg  Objective: Vitals:   10/28/21 0839 10/28/21 1258 10/28/21 2121 10/29/21 0818  BP: 135/72 (!) 143/72 135/78 138/87  Pulse: 77 88 87 90  Resp: 18 16 17 18   Temp: 98.7 F (37.1 C) 97.7 F (36.5 C) 98.5 F (36.9 C) 98.3 F (36.8 C)  TempSrc:   Oral Oral  SpO2: 100% 98% 100% 97%  Weight:      Height:         Intake/Output Summary (Last 24 hours) at 10/29/2021 1206 Last data filed at 10/28/2021 1613 Gross per 24 hour  Intake --  Output 250 ml  Net -250 ml   Wt Readings from Last 3 Encounters:  10/28/21 122.3 kg  04/28/21 (!) 143.4 kg  08/27/17 (!) 139.9 kg    Examination:  Constitutional: NAD Eyes: no scleral icterus ENMT: Mucous membranes are moist.  Neck: normal, supple Respiratory: clear to auscultation bilaterally, no wheezing, no crackles. Normal respiratory effort. No accessory muscle use.  Cardiovascular: Regular rate and rhythm, no murmurs / rubs / gallops. No LE edema.  Abdomen: non distended, no tenderness. Bowel sounds positive.  Musculoskeletal: no clubbing / cyanosis.   Data Reviewed: I have independently reviewed following labs and imaging studies   CBC Recent Labs  Lab 10/23/21 0458 10/24/21 0404 10/25/21 0300 10/27/21 0338  WBC 5.5 5.7 6.5 10.4  HGB 8.7* 8.8* 9.3* 9.3*  HCT 28.5* 28.2* 30.3* 30.8*  PLT 259 271 295 288  MCV 91.9 91.0 91.3 92.8  MCH 28.1 28.4 28.0 28.0  MCHC 30.5 31.2 30.7 30.2  RDW 15.3 15.0 15.1 15.1  LYMPHSABS 1.4 1.6 1.5  --   MONOABS 0.6 0.6 0.6  --  EOSABS 0.2 0.1 0.1  --   BASOSABS 0.0 0.0 0.1  --     Recent Labs  Lab 10/23/21 0458 10/24/21 0404 10/25/21 0300 10/26/21 0421 10/27/21 0338  NA  --   --  140  --  143  K  --   --  3.6  --  3.8  CL  --   --  104  --  109  CO2  --   --  24  --  27  GLUCOSE  --   --  115*  --  130*  BUN  --   --  9  --  11  CREATININE  --   --  0.68  --  0.80  CALCIUM  --   --  9.0  --  8.9  MG 2.1 2.1 2.0 2.0  --   HGBA1C  --   --   --   --  5.1    ------------------------------------------------------------------------------------------------------------------ No results for input(s): "CHOL", "HDL", "LDLCALC", "TRIG", "CHOLHDL", "LDLDIRECT" in the last 72 hours.  Lab Results  Component Value Date   HGBA1C 5.1 10/27/2021    ------------------------------------------------------------------------------------------------------------------ No results for input(s): "TSH", "T4TOTAL", "T3FREE", "THYROIDAB" in the last 72 hours.  Invalid input(s): "FREET3"  Cardiac Enzymes No results for input(s): "CKMB", "TROPONINI", "MYOGLOBIN" in the last 168 hours.  Invalid input(s): "CK" ------------------------------------------------------------------------------------------------------------------ No results found for: "BNP"  CBG: Recent Labs  Lab 10/28/21 1158 10/28/21 1610 10/28/21 2118 10/29/21 0815 10/29/21 1139  GLUCAP 220* 258* 252* 169* 201*    No results found for this or any previous visit (from the past 240 hour(s)).   Radiology Studies: No results found.   Marzetta Board, MD, PhD [facility]  Between 7 am - 7 pm I am available, please contact me via Amion (for emergencies) or Securechat (non urgent messages)  Between 7 pm - 7 am I am not available, please contact night coverage MD/APP via Amion

## 2021-10-29 NOTE — Progress Notes (Signed)
Physical Therapy Treatment Patient Details Name: Troy Randall MRN: [id number] DOB: 29-Aug-1960 Today's Date: 10/29/2021   History of Present Illness Patient is a 61 y/o male who presents on 09/04/21 with dark stools, dizziness and SOB. Found to have GI bleed and acute anemia s/p embolization 09/06/21 and percutaneous cholecystostomy 09/07/21. Intubated 9/3, CRRT 9/5-9/8. Hemorrhagic and septic shock 9/5. Trach placed 9/11. 9/16 MRI rapid development numerous round white matter lesions in both cerebral hemispheres; 9/17 MRI: numerous white matter lesions consistent with active demyelination - concerning for MS, ADEM, and cerebral vasculitis; 9/18 Lumbar puncture. Pt returned to ICU on 9/29 with aspiration PNA, tachypnea, tachycardia and increased WOB. 10/2 - pt transferred back to [facility]; started having GIB. Underwent sigmoidoscopy on 10/07/2021. Plasma exchange started. 10/17 trach capped. 10/19 decanulated. s/p Cholecystostomy tube exchange 10/24/21. PMH includes CVA, DM, HTN.    PT Comments    Pt was seen for progression of mobility today with OT to assist, and pt is quite unsafe without two person help and one extra to push recliner to sit with minimal notice.  Pt is stepping with buckling on LLE, has tendency to step cross over on RLE and  tends to sit with poor descent control unless cued extensively.  Pt is appropriate for CIR intervention as his endurance will be good and has a multidisciplinary need with a lot of therapy intervention, and will benefit with family support at DC.  Follow acutely for PT goals as are outlined below.  Recommendations for follow up therapy are one component of a multi-disciplinary discharge planning process, led by the attending physician.  Recommendations may be updated based on patient status, additional functional criteria and insurance authorization.  Follow Up Recommendations  Acute inpatient rehab (3hours/day)     Assistance Recommended at Discharge Frequent or  constant Supervision/Assistance  Patient can return home with the following Two people to help with walking and/or transfers;Assistance with cooking/housework;Assist for transportation;Help with stairs or ramp for entrance;A lot of help with bathing/dressing/bathroom   Equipment Recommendations  Rolling walker (2 wheels)    Recommendations for Other Services Rehab consult     Precautions / Restrictions Precautions Precautions: Fall Precaution Comments: R drain Restrictions Weight Bearing Restrictions: No     Mobility  Bed Mobility Overal bed mobility: Needs Assistance Bed Mobility: Supine to Sit, Sit to Supine Rolling: Mod assist   Supine to sit: Max assist Sit to supine: Max assist (.)   General bed mobility comments: requires repetitive attempts as PT is starting him to side of bed alone    Transfers Overall transfer level: Needs assistance Equipment used: Rolling walker (2 wheels) Transfers: Sit to/from Stand Sit to Stand: Min assist, +2 physical assistance, +2 safety/equipment           General transfer comment: verbal and tactile cues for hand placement to stand and sit.  Assitance managing RW    Ambulation/Gait Ambulation/Gait assistance: Min assist, +2 physical assistance, +2 safety/equipment, Mod assist Gait Distance (Feet): 95 Feet (35+60) Assistive device: Rolling walker (2 wheels), 2 person hand held assist, Pushed wheelchair Gait Pattern/deviations: Decreased stride length, Wide base of support, Step-through pattern Gait velocity: decreased Gait velocity interpretation: <1.31 ft/sec, indicative of household ambulator Pre-gait activities: standing balance correction General Gait Details: 2 min to occas mod when LLE buckles, and has close chair follow to avoid a complete fall   Stairs             Wheelchair Mobility    Modified Rankin (Stroke Patients  Only)       Balance Overall balance assessment: Needs assistance Sitting-balance  support: Feet supported Sitting balance-Leahy Scale: Fair     Standing balance support: Bilateral upper extremity supported, During functional activity Standing balance-Leahy Scale: Poor Standing balance comment: Requires UE support in standing.                            Cognition Arousal/Alertness: Awake/alert Behavior During Therapy: Flat affect Overall Cognitive Status: Impaired/Different from baseline Area of Impairment: Problem solving, Awareness, Safety/judgement, Following commands, Attention                   Current Attention Level: Selective   Following Commands: Follows one step commands with increased time, Follows one step commands inconsistently Safety/Judgement: Decreased awareness of safety, Decreased awareness of deficits Awareness: Intellectual Problem Solving: Slow processing, Decreased initiation, Difficulty sequencing, Requires verbal cues, Requires tactile cues General Comments: requires hand over hand to manage his use of UE's with transfers to walker        Exercises      General Comments General comments (skin integrity, edema, etc.): pt is slow to respond to cues for sequencing standing and has poor uptake of information about standing balance safety and using walker safely      Pertinent Vitals/Pain Pain Assessment Pain Assessment: Faces Faces Pain Scale: No hurt Pain Location: pt is not complaining    Home Living                          Prior Function            PT Goals (current goals can now be found in the care plan section) Acute Rehab PT Goals Patient Stated Goal: wife wants pt to have rehab Progress towards PT goals: Progressing toward goals    Frequency    Min 3X/week      PT Plan Current plan remains appropriate    Co-evaluation PT/OT/SLP Co-Evaluation/Treatment: Yes Reason for Co-Treatment: Necessary to address cognition/behavior during functional activity;Complexity of the patient's  impairments (multi-system involvement);For patient/therapist safety (Simultaneous filing. User may not have seen previous data.) PT goals addressed during session: Mobility/safety with mobility;Balance;Proper use of DME OT goals addressed during session: Strengthening/ROM      AM-PAC PT "6 Clicks" Mobility   Outcome Measure  Help needed turning from your back to your side while in a flat bed without using bedrails?: A Lot Help needed moving from lying on your back to sitting on the side of a flat bed without using bedrails?: A Lot Help needed moving to and from a bed to a chair (including a wheelchair)?: Total Help needed standing up from a chair using your arms (e.g., wheelchair or bedside chair)?: Total Help needed to walk in hospital room?: Total Help needed climbing 3-5 steps with a railing? : Total 6 Click Score: 8    End of Session Equipment Utilized During Treatment: Gait belt Activity Tolerance: Patient tolerated treatment well Patient left: in bed;with call bell/phone within reach;with bed alarm set;with family/visitor present Nurse Communication: Mobility status PT Visit Diagnosis: Unsteadiness on feet (R26.81);Muscle weakness (generalized) (M62.81);Difficulty in walking, not elsewhere classified (R26.2)     Time: 1914-7829 PT Time Calculation (min) (ACUTE ONLY): 35 min  Charges:  $Gait Training: 8-22 mins $Therapeutic Activity: 8-22 mins     Ramond Dial 10/29/2021, 3:03 PM  Mee Hives, PT PhD Acute Rehab Dept. Number: [facility] [phone]  and [facility] [phone]

## 2021-10-30 ENCOUNTER — Inpatient Hospital Stay (HOSPITAL_COMMUNITY)
Admission: RE | Admit: 2021-10-30 | Discharge: 2021-11-06 | DRG: 945 | Disposition: A | Discharge status: Other Acute Inpatient Hospital | Payer: Medicare HMO | Source: Intra-hospital | Attending: Physical Medicine & Rehabilitation | Admitting: Physical Medicine & Rehabilitation

## 2021-10-30 ENCOUNTER — Encounter (HOSPITAL_COMMUNITY): Payer: Self-pay | Admitting: Physical Medicine & Rehabilitation

## 2021-10-30 ENCOUNTER — Other Ambulatory Visit: Payer: Self-pay

## 2021-10-30 DIAGNOSIS — Z7982 Long term (current) use of aspirin: Secondary | ICD-10-CM

## 2021-10-30 DIAGNOSIS — E785 Hyperlipidemia, unspecified: Secondary | ICD-10-CM | POA: Diagnosis not present

## 2021-10-30 DIAGNOSIS — M25512 Pain in left shoulder: Secondary | ICD-10-CM | POA: Diagnosis not present

## 2021-10-30 DIAGNOSIS — E119 Type 2 diabetes mellitus without complications: Secondary | ICD-10-CM | POA: Diagnosis present

## 2021-10-30 DIAGNOSIS — E669 Obesity, unspecified: Secondary | ICD-10-CM | POA: Diagnosis not present

## 2021-10-30 DIAGNOSIS — G35 Multiple sclerosis: Secondary | ICD-10-CM | POA: Diagnosis not present

## 2021-10-30 DIAGNOSIS — D638 Anemia in other chronic diseases classified elsewhere: Secondary | ICD-10-CM | POA: Diagnosis not present

## 2021-10-30 DIAGNOSIS — R5381 Other malaise: Secondary | ICD-10-CM | POA: Diagnosis not present

## 2021-10-30 DIAGNOSIS — I471 Supraventricular tachycardia, unspecified: Secondary | ICD-10-CM | POA: Diagnosis not present

## 2021-10-30 DIAGNOSIS — R234 Changes in skin texture: Secondary | ICD-10-CM | POA: Diagnosis not present

## 2021-10-30 DIAGNOSIS — R339 Retention of urine, unspecified: Secondary | ICD-10-CM | POA: Diagnosis present

## 2021-10-30 DIAGNOSIS — Z8673 Personal history of transient ischemic attack (TIA), and cerebral infarction without residual deficits: Secondary | ICD-10-CM

## 2021-10-30 DIAGNOSIS — I4891 Unspecified atrial fibrillation: Secondary | ICD-10-CM | POA: Diagnosis not present

## 2021-10-30 DIAGNOSIS — Z888 Allergy status to other drugs, medicaments and biological substances status: Secondary | ICD-10-CM | POA: Diagnosis not present

## 2021-10-30 DIAGNOSIS — Z79899 Other long term (current) drug therapy: Secondary | ICD-10-CM

## 2021-10-30 DIAGNOSIS — G9341 Metabolic encephalopathy: Secondary | ICD-10-CM | POA: Diagnosis not present

## 2021-10-30 DIAGNOSIS — K529 Noninfective gastroenteritis and colitis, unspecified: Secondary | ICD-10-CM | POA: Diagnosis not present

## 2021-10-30 DIAGNOSIS — N3000 Acute cystitis without hematuria: Secondary | ICD-10-CM | POA: Diagnosis not present

## 2021-10-30 DIAGNOSIS — Z8719 Personal history of other diseases of the digestive system: Secondary | ICD-10-CM | POA: Diagnosis not present

## 2021-10-30 DIAGNOSIS — Z794 Long term (current) use of insulin: Secondary | ICD-10-CM

## 2021-10-30 DIAGNOSIS — M25511 Pain in right shoulder: Secondary | ICD-10-CM | POA: Diagnosis not present

## 2021-10-30 DIAGNOSIS — A4150 Gram-negative sepsis, unspecified: Secondary | ICD-10-CM | POA: Diagnosis not present

## 2021-10-30 DIAGNOSIS — N179 Acute kidney failure, unspecified: Secondary | ICD-10-CM | POA: Diagnosis not present

## 2021-10-30 DIAGNOSIS — R1310 Dysphagia, unspecified: Secondary | ICD-10-CM | POA: Diagnosis present

## 2021-10-30 DIAGNOSIS — N390 Urinary tract infection, site not specified: Secondary | ICD-10-CM | POA: Diagnosis not present

## 2021-10-30 DIAGNOSIS — I10 Essential (primary) hypertension: Secondary | ICD-10-CM | POA: Diagnosis not present

## 2021-10-30 DIAGNOSIS — Z7984 Long term (current) use of oral hypoglycemic drugs: Secondary | ICD-10-CM | POA: Diagnosis not present

## 2021-10-30 DIAGNOSIS — G969 Disorder of central nervous system, unspecified: Secondary | ICD-10-CM | POA: Diagnosis not present

## 2021-10-30 DIAGNOSIS — R6520 Severe sepsis without septic shock: Secondary | ICD-10-CM | POA: Diagnosis not present

## 2021-10-30 DIAGNOSIS — Z434 Encounter for attention to other artificial openings of digestive tract: Secondary | ICD-10-CM | POA: Diagnosis not present

## 2021-10-30 DIAGNOSIS — R509 Fever, unspecified: Secondary | ICD-10-CM | POA: Diagnosis not present

## 2021-10-30 DIAGNOSIS — G0400 Acute disseminated encephalitis and encephalomyelitis, unspecified: Secondary | ICD-10-CM | POA: Diagnosis present

## 2021-10-30 DIAGNOSIS — Z7902 Long term (current) use of antithrombotics/antiplatelets: Secondary | ICD-10-CM | POA: Diagnosis not present

## 2021-10-30 DIAGNOSIS — A419 Sepsis, unspecified organism: Secondary | ICD-10-CM | POA: Diagnosis not present

## 2021-10-30 DIAGNOSIS — Z880 Allergy status to penicillin: Secondary | ICD-10-CM

## 2021-10-30 DIAGNOSIS — R531 Weakness: Secondary | ICD-10-CM | POA: Diagnosis not present

## 2021-10-30 DIAGNOSIS — B372 Candidiasis of skin and nail: Secondary | ICD-10-CM | POA: Diagnosis present

## 2021-10-30 DIAGNOSIS — M545 Low back pain, unspecified: Secondary | ICD-10-CM | POA: Diagnosis not present

## 2021-10-30 LAB — MAGNESIUM: Magnesium: 1.8 mg/dL (ref 1.7–2.4)

## 2021-10-30 LAB — CBC
HCT: 30.4 % — ABNORMAL LOW (ref 39.0–52.0)
Hemoglobin: 9.6 g/dL — ABNORMAL LOW (ref 13.0–17.0)
MCH: 28.8 pg (ref 26.0–34.0)
MCHC: 31.6 g/dL (ref 30.0–36.0)
MCV: 91.3 fL (ref 80.0–100.0)
Platelets: 254 10*3/uL (ref 150–400)
RBC: 3.33 MIL/uL — ABNORMAL LOW (ref 4.22–5.81)
RDW: 14.9 % (ref 11.5–15.5)
WBC: 7 10*3/uL (ref 4.0–10.5)
nRBC: 0 % (ref 0.0–0.2)

## 2021-10-30 LAB — COMPREHENSIVE METABOLIC PANEL
ALT: 24 U/L (ref 0–44)
AST: 35 U/L (ref 15–41)
Albumin: 2.9 g/dL — ABNORMAL LOW (ref 3.5–5.0)
Alkaline Phosphatase: 108 U/L (ref 38–126)
Anion gap: 8 (ref 5–15)
BUN: 10 mg/dL (ref 6–20)
CO2: 26 mmol/L (ref 22–32)
Calcium: 8.8 mg/dL — ABNORMAL LOW (ref 8.9–10.3)
Chloride: 106 mmol/L (ref 98–111)
Creatinine, Ser: 0.68 mg/dL (ref 0.61–1.24)
GFR, Estimated: >60 mL/min (ref >60)
Glucose, Bld: 174 mg/dL — ABNORMAL HIGH (ref 70–99)
Potassium: 4 mmol/L (ref 3.5–5.1)
Sodium: 140 mmol/L (ref 135–145)
Total Bilirubin: 0.6 mg/dL (ref 0.3–1.2)
Total Protein: 5.9 g/dL — ABNORMAL LOW (ref 6.5–8.1)

## 2021-10-30 LAB — GLUCOSE, CAPILLARY
Glucose-Capillary: 150 mg/dL — ABNORMAL HIGH (ref 70–99)
Glucose-Capillary: 184 mg/dL — ABNORMAL HIGH (ref 70–99)
Glucose-Capillary: 210 mg/dL — ABNORMAL HIGH (ref 70–99)
Glucose-Capillary: 267 mg/dL — ABNORMAL HIGH (ref 70–99)

## 2021-10-30 MED ORDER — LIDOCAINE 5 % EX PTCH
3.0000 | MEDICATED_PATCH | CUTANEOUS | Status: DC
  Administered 2021-10-31 – 2021-11-05 (×6): 3 via TRANSDERMAL
  Filled 2021-10-30 (×6): qty 3

## 2021-10-30 MED ORDER — GUAIFENESIN-DM 100-10 MG/5ML PO SYRP: 5.0000 mL | ORAL_SOLUTION | Freq: Four times a day (QID) | ORAL | Status: DC | PRN

## 2021-10-30 MED ORDER — PROCHLORPERAZINE 25 MG RE SUPP: 12.5000 mg | Freq: Four times a day (QID) | RECTAL | Status: DC | PRN

## 2021-10-30 MED ORDER — FOOD THICKENER (SIMPLYTHICK): 10.0000 | ORAL | Status: DC | PRN

## 2021-10-30 MED ORDER — INSULIN ASPART 100 UNIT/ML IJ SOLN
0.0000 [IU] | Freq: Three times a day (TID) | INTRAMUSCULAR | Status: DC
  Administered 2021-10-30: 3 [IU] via SUBCUTANEOUS
  Administered 2021-10-31 (×2): 2 [IU] via SUBCUTANEOUS
  Administered 2021-10-31: 3 [IU] via SUBCUTANEOUS
  Administered 2021-11-01 (×2): 2 [IU] via SUBCUTANEOUS
  Administered 2021-11-01: 1 [IU] via SUBCUTANEOUS
  Administered 2021-11-02: 2 [IU] via SUBCUTANEOUS
  Administered 2021-11-02: 3 [IU] via SUBCUTANEOUS
  Administered 2021-11-02: 1 [IU] via SUBCUTANEOUS
  Administered 2021-11-03: 2 [IU] via SUBCUTANEOUS
  Administered 2021-11-03: 5 [IU] via SUBCUTANEOUS
  Administered 2021-11-03: 1 [IU] via SUBCUTANEOUS
  Administered 2021-11-04: 5 [IU] via SUBCUTANEOUS
  Administered 2021-11-04: 3 [IU] via SUBCUTANEOUS
  Administered 2021-11-04: 1 [IU] via SUBCUTANEOUS
  Administered 2021-11-05 (×2): 2 [IU] via SUBCUTANEOUS
  Administered 2021-11-05: 1 [IU] via SUBCUTANEOUS
  Administered 2021-11-06 (×2): 2 [IU] via SUBCUTANEOUS

## 2021-10-30 MED ORDER — LIDOCAINE 5 % EX PTCH: 1.0000 | MEDICATED_PATCH | CUTANEOUS | Status: DC

## 2021-10-30 MED ORDER — ALUM & MAG HYDROXIDE-SIMETH 200-200-20 MG/5ML PO SUSP: 30.0000 mL | ORAL | Status: DC | PRN

## 2021-10-30 MED ORDER — SORBITOL 70 % SOLN: 30.0000 mL | Freq: Every day | Status: DC | PRN

## 2021-10-30 MED ORDER — TRAZODONE HCL 50 MG PO TABS
25.0000 mg | ORAL_TABLET | Freq: Every evening | ORAL | Status: DC | PRN
  Filled 2021-10-30: qty 1

## 2021-10-30 MED ORDER — ROSUVASTATIN CALCIUM 20 MG PO TABS
40.0000 mg | ORAL_TABLET | Freq: Every day | ORAL | Status: DC
  Administered 2021-10-31 – 2021-11-06 (×7): 40 mg via ORAL
  Filled 2021-10-30 (×7): qty 2

## 2021-10-30 MED ORDER — ZINC OXIDE 12.8 % EX OINT
TOPICAL_OINTMENT | Freq: Two times a day (BID) | CUTANEOUS | Status: DC
  Filled 2021-10-30: qty 56.7

## 2021-10-30 MED ORDER — INSULIN GLARGINE-YFGN 100 UNIT/ML ~~LOC~~ SOLN
15.0000 [IU] | Freq: Two times a day (BID) | SUBCUTANEOUS | Status: DC
  Administered 2021-10-30 – 2021-11-06 (×15): 15 [IU] via SUBCUTANEOUS
  Filled 2021-10-30 (×18): qty 0.15

## 2021-10-30 MED ORDER — DOCUSATE SODIUM 100 MG PO CAPS
100.0000 mg | ORAL_CAPSULE | Freq: Two times a day (BID) | ORAL | Status: DC
  Filled 2021-10-30 (×12): qty 1

## 2021-10-30 MED ORDER — PROCHLORPERAZINE EDISYLATE 10 MG/2ML IJ SOLN
5.0000 mg | Freq: Four times a day (QID) | INTRAMUSCULAR | Status: DC | PRN
  Filled 2021-10-30: qty 2

## 2021-10-30 MED ORDER — GLUCERNA SHAKE PO LIQD
237.0000 mL | Freq: Three times a day (TID) | ORAL | Status: DC
  Administered 2021-10-31 – 2021-11-06 (×19): 237 mL via ORAL
  Filled 2021-10-30: qty 237

## 2021-10-30 MED ORDER — PANTOPRAZOLE 2 MG/ML SUSPENSION
40.0000 mg | Freq: Every day | ORAL | Status: DC
  Administered 2021-10-31: 40 mg via ORAL
  Filled 2021-10-30: qty 20

## 2021-10-30 MED ORDER — PANTOPRAZOLE SODIUM 40 MG PO TBEC: 40.0000 mg | DELAYED_RELEASE_TABLET | Freq: Two times a day (BID) | ORAL | 1 refills | Status: DC

## 2021-10-30 MED ORDER — METHOCARBAMOL 500 MG PO TABS
500.0000 mg | ORAL_TABLET | Freq: Four times a day (QID) | ORAL | Status: DC | PRN
  Administered 2021-11-04 – 2021-11-05 (×2): 500 mg via ORAL
  Filled 2021-10-30 (×2): qty 1

## 2021-10-30 MED ORDER — ENOXAPARIN SODIUM 40 MG/0.4ML IJ SOSY
40.0000 mg | PREFILLED_SYRINGE | INTRAMUSCULAR | Status: DC
  Administered 2021-10-31: 40 mg via SUBCUTANEOUS
  Filled 2021-10-30: qty 0.4

## 2021-10-30 MED ORDER — DOXAZOSIN MESYLATE 2 MG PO TABS
2.0000 mg | ORAL_TABLET | Freq: Every day | ORAL | Status: DC
  Administered 2021-10-31 – 2021-11-06 (×7): 2 mg via ORAL
  Filled 2021-10-30 (×8): qty 1

## 2021-10-30 MED ORDER — DOXAZOSIN MESYLATE 2 MG PO TABS: 2.0000 mg | ORAL_TABLET | Freq: Every day | ORAL | Status: DC

## 2021-10-30 MED ORDER — PROCHLORPERAZINE MALEATE 5 MG PO TABS: 5.0000 mg | ORAL_TABLET | Freq: Four times a day (QID) | ORAL | Status: DC | PRN

## 2021-10-30 MED ORDER — ACETAMINOPHEN 325 MG PO TABS
325.0000 mg | ORAL_TABLET | ORAL | Status: DC | PRN
  Administered 2021-10-30 – 2021-11-06 (×17): 650 mg via ORAL
  Filled 2021-10-30 (×17): qty 2

## 2021-10-30 MED ORDER — ENOXAPARIN SODIUM 30 MG/0.3ML IJ SOSY: 30.0000 mg | PREFILLED_SYRINGE | INTRAMUSCULAR | Status: DC

## 2021-10-30 MED ORDER — POLYETHYLENE GLYCOL 3350 17 G PO PACK
17.0000 g | PACK | Freq: Every day | ORAL | Status: DC
  Filled 2021-10-30 (×7): qty 1

## 2021-10-30 MED ORDER — INSULIN ASPART 100 UNIT/ML IJ SOLN
0.0000 [IU] | Freq: Every day | INTRAMUSCULAR | Status: DC
  Administered 2021-10-30: 3 [IU] via SUBCUTANEOUS
  Administered 2021-11-01 – 2021-11-06 (×2): 2 [IU] via SUBCUTANEOUS

## 2021-10-30 MED ORDER — DIPHENHYDRAMINE HCL 12.5 MG/5ML PO ELIX: 12.5000 mg | ORAL_SOLUTION | Freq: Four times a day (QID) | ORAL | Status: DC | PRN

## 2021-10-30 MED ORDER — NYSTATIN 100000 UNIT/GM EX POWD
Freq: Two times a day (BID) | CUTANEOUS | Status: DC
  Filled 2021-10-30: qty 15

## 2021-10-30 MED ORDER — FLEET ENEMA 7-19 GM/118ML RE ENEM: 1.0000 | ENEMA | Freq: Once | RECTAL | Status: DC | PRN

## 2021-10-30 MED ORDER — MAGNESIUM HYDROXIDE 400 MG/5ML PO SUSP: 30.0000 mL | Freq: Every day | ORAL | Status: DC | PRN

## 2021-10-30 NOTE — Progress Notes (Signed)
Patient ID: Troy Randall, male   DOB: 01/10/1960, 61 y.o.   MRN: [id number] Met with the patient and wife to review current situation, rehab process, team conference and plan of care. Reviewed medications, secondary risk management. Small stage 2 to sacrum and MASD, biliary drain and foley. Will need education on drain management for d/c and skin care with dietary modifications. Will continue to follow along to address educational needs to facilitate preparation for discharge home with wife. Margarito Liner

## 2021-10-30 NOTE — Discharge Summary (Addendum)
Physician Discharge Summary  Troy Randall [id number] DOB: 09-02-1960 DOA: 09/04/2021  PCP: [name], MD  Admit date: 09/04/2021 Discharge date: 10/30/2021  Admitted From: home Disposition:  CIR  Recommendations for Outpatient Follow-up:  Follow up with PCP in 1-2 weeks  Home Health: none Equipment/Devices: none  Discharge Condition: stable CODE STATUS: Full code Diet Orders (From admission, onward)     Start     Ordered   10/29/21 0900  DIET DYS 3 Room service appropriate? Yes; Fluid consistency: Nectar Thick  Diet effective now       Question Answer Comment  Room service appropriate? Yes   Fluid consistency: Nectar Thick      10/29/21 0859            HPI: Per admitting MD, Troy Randall is a 61 y.o. male with medical history significant of hypertension, hyperlipidemia, diabetes, prior stroke on aspirin and Plavix, who was in his usual state of health when this morning he developed frequent dark stools.  Denies any bright red blood per rectum.  No nausea or vomiting.  No abdominal pain.  He was feeling dizzy and lightheaded on standing.  Felt that "his breathing was not right".  Denied any chest pain.  Reports having 6-7 dark bowel movements today.  1 of those bowel movements with after he arrived to the hospital.  No history of NSAID use.  Hospital Course / Discharge diagnoses: Principal Problem:   Acute metabolic encephalopathy Active Problems:   Uncontrolled type 2 diabetes mellitus with hyperglycemia, without long-term current use of insulin (HCC)   Essential hypertension   Hyperlipidemia   Chronic respiratory failure with hypoxia (HCC)   Tracheostomy status (HCC)   Anemia due to blood loss   History of lower GI bleeding: 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    H/O ischemic bowel disease   Acute emphysematous cholecystitis   Pressure injury of skin   Acute respiratory insufficiency   Significant events: 8/31 presented to [facility]  ED, [name] Admit, GI Consult 9/1 CTA> no evidence of active GI bleed, 2 U PRBC 9/2 2 U PRBC, colonoscopy > no active bleed found but large clots throughout entire colon. EGD> small hiatal hernia, erosive gastropathy with no stigmata of recent bleeding, and non-bleeding duodenal diverticulum.   Underwent then small bowel enteroscopy which noted intermittent blood pumping in the proximal jejunum but was unable to be reached despite multiple attempts, was injected and clip placed. Repeat CTA repeated, which showed positive jejunal diverticular bleeding. IR> active extrav at [facility] territory, jejunal arcade branch, successful coil embo 9/3 Intubated. CTA> no evidence of active GI bleeding.  New finding of abnormal gallbladder with nondependent air, gallbladder wall air, pericholecystic and right upper quadrant information.  IR placed percutaneous cholecystotomy tube.  9/4 R. IJ central line placed with dialysis triple lumen catheter 9/5 CRRT initiated 9/6 Bedside EGD without any evidence of bleeding. CTA a/p without etiology for bleed 9/8 CRRT discontinued. Required ETT exchange yesterday due to blown cuff. 9/11 Bronchoscopic guided percutaneous tracheostomy placement.  Right IJ central line removed. 9/15 flexible sigmoidoscopy with removal of endoscopy 9/16 MRI brain w/o contrast amended study secondary to range of motion.  Rapid development numerous round white matter lesions in both cerebral hemispheres.  Prior MRIs. 9/17 MRI brain: Numerous contrast-enhancing bilateral white matter lesions, consistent with active demyelination. 9/18 Lumbar puncture  9/19 MRI cervical spine, thoracic : Limited exam, within limitations there is not definitive evidence of active  demyelinization. Within limitations of motion artifact, there may be demyelinating lesions at the C6 and T1 vertebral body levels. 9/25-clinical exam not improved despite completion of 5-day IV steroids.  Neurology planning to repeat MRI of the brain  cervical spine thoracic spine. Possible IR consult for catheter placement for possible Plex therapy if abnormal scans 9/29-patient transferred to ICU for respiratory distress due to aspiration pneumonia with sepsis 10/2-patient transferred back to [facility]; started having GI bleed, Dr. Mann/Dr. Benson Norway reconsulted.  Underwent sigmoidoscopy on 10/07/2021.  Plasma exchange started 10/26 - CIR discharge   Principal problem Hemorrhagic shock/GI bleed -patient was initially admitted to the ICU, gastroenterology consulted and followed patient while hospitalized.  Patient has had multiple endoscopic procedures last month including EGD / capsule endoscopy /and eventually coil embolization of jejunal branch of SMA.  This was likely due to stress ulceration versus ischemic disease per gastroenterology. A sigmoidoscopy on 10/07/2021 showed single solitary ulcer in the sigmoid colon which was biopsied and pathology shows chronic colitis. -Has had multiple packed red cell transfusion during this hospitalization.  Appears to have received 19 units of packed red blood cells total. Hb stable, continue to monitor periodocally   Active problems Acute metabolic encephalopathy-due to persistent confusion he underwent an MRI of the brain, which showed bilateral white matter lesions concerning for active demyelinating disease.  Neurology consulted and followed patient while hospitalized.  It is not clear cause for his demyelinating disease.  He underwent IV steroids for 5 days, given lack of improvement underwent plasma exchange x5 doses, last on 10/11.  Following that he has started to experience gradual improvement, and neurology recommends rehab and no further interventions. Acute respiratory failure with hypoxia status post tracheostomy, aspiration pneumonia -he has completed antibiotics for his pneumonia.  Initially underwent tracheostomy placement on 9/11, successfully decannulated 10/19.  Breathing status currently stable E.  coli cholecystitis status post percutaneous  cholecystostomy drain placement -Had percutaneous cholecystostomy drain placement on 09/07/2021. Subsequently had completed course of antibiotics. Cholecystostomy tube was exchanged by IR on 10/24/2021. Will eventually need outpatient surgery/IR follow-up once improved Hypokalemia, hypophosphatemia, elevated liver enzymes, thrombocytopenia, AKI -Resolved. CCRT was discontinued on 09/12/2021. Diabetes mellitus type 2 with hyperglycemia -continue diabetic regimen control per CIR protocol. Resome home meds on discharge Hypertension -Continue monitoring blood pressure. Currently on doxazosin  Stage II medial sacral ulcer-Not present on admission -Continue local wound care. Acute urinary retention -Foley catheter removed on 10/13/2021 but Foley catheter was reinserted on 10/14/2021. Continue doxazosin. Voiding trial when more mobile Moderate protein calorie malnutrition/poor p.o. intake/dysphagia -tolerating diet.  He was on an NG feeding tube which was discontinued mid-October.  Oral intake picking up Obesity, class II-BMI 36.  He would benefit from weight loss   Sepsis ruled out     Discharge Instructions  Discharge Instructions     Ambulatory referral to Neurology   Complete by: As directed    An appointment is requested in approximately: 2-4 weeks      Allergies as of 10/30/2021       Reactions   Metformin And Related    GI upset   Penicillins Hives        Medication List     STOP taking these medications    amLODipine 10 MG tablet Commonly known as: NORVASC   aspirin EC 81 MG tablet   clopidogrel 75 MG tablet Commonly known as: PLAVIX   lisinopril 40 MG tablet Commonly known as: ZESTRIL   tamsulosin 0.4 MG Caps capsule Commonly known  as: FLOMAX   traZODone 150 MG tablet Commonly known as: DESYREL       TAKE these medications    BD Veo Insulin Syringe U/F 31G X 15/64" 1 ML Misc Generic drug: Insulin Syringe-Needle  U-100 USE AS DIRECTED   doxazosin 2 MG tablet Commonly known as: CARDURA Take 1 tablet (2 mg total) by mouth daily.   glipiZIDE 10 MG 24 hr tablet Commonly known as: Glucotrol XL Take 1 tablet (10 mg total) by mouth daily with breakfast.   glucose blood test strip Commonly known as: OneTouch Verio TEST twice a day   Insulin Pen Needle 32G X 5 MM Misc Commonly known as: NovoTwist Use two daily to inject Victoza and Toujeo.   levocetirizine 5 MG tablet Commonly known as: XYZAL Take 1 tablet by mouth at bedtime.   metFORMIN 500 MG 24 hr tablet Commonly known as: GLUCOPHAGE-XR Take 500-1,000 mg by mouth in the morning and at bedtime.   OneTouch Delica Lancets 93G Misc Use to check blood sugar once a day dx code E11.65   pantoprazole 40 MG tablet Commonly known as: Protonix Take 1 tablet (40 mg total) by mouth 2 (two) times daily before a meal.   rosuvastatin 40 MG tablet Commonly known as: Crestor Take 1 tablet (40 mg total) by mouth daily.   Tyler Aas FlexTouch 200 UNIT/ML FlexTouch Pen Generic drug: insulin degludec Inject 20 Units into the skin in the morning and at bedtime.        Follow-up Information     [facility]. Schedule an appointment as soon as possible for a visit.   Specialty: Neurology Why: Please see Dr. Felecia Shelling within 4 weeks of discharge Contact information: [place] [phone]                Procedures/Studies:  IR EXCHANGE BILIARY DRAIN  Result Date: 10/24/2021 INDICATION: History of acute cholecystitis, post ultrasound fluoroscopic guided cholecystostomy tube placement on 09/08/2018. EXAM: FLUOROSCOPIC GUIDED CHOLECYSTOSTOMY TUBE EXCHANGE COMPARISON:  09/07/2021, 10/16/2021 MEDICATIONS: None ANESTHESIA/SEDATION: None CONTRAST:  5 mL Omnipaque 300-administered into the gallbladder lumen. FLUOROSCOPY TIME:  Forty-eight seconds, 11 mGy COMPLICATIONS: None immediate.  PROCEDURE: The patient was positioned supine on the fluoroscopy table. The external portion of the existing cholecystostomy tube as well as the surrounding skin was prepped and draped in usual sterile fashion. A time-out was performed prior to the initiation of the procedure. A preprocedural spot fluoroscopic image was obtained of the right upper abdominal quadrant existing cholecystostomy tube. The skin surrounding the cholecystostomy tube was anesthetized with 1% lidocaine with epinephrine. The external portion of the cholecystostomy tube was cut and cannulated with a short Amplatz wire which was advanced through the tube and coiled within the gallbladder lumen Next, under intermittent fluoroscopic guidance, the existing 10 French cholecystostomy tube was exchanged for a new 10 [place] cholecystostomy tube which was repositioned into the more central aspect of the gallbladder lumen. Contrast injection confirms appropriate positioning and functionality of the cholecystomy tube. The cholecystostomy tube was flushed with a small amount of saline and reconnected to a gravity bag. The cholecystostomy tube was secured with an interrupted suture and a Stat Lock device. A dressing was applied. The patient tolerated the procedure well without immediate postprocedural complication. FINDINGS: Preprocedural spot fluoroscopic image demonstrates unchanged positioning of cholecystostomy tube with end coiled and locked over the expected location of the fundus of the gallbladder After fluoroscopic guided exchange, the new 10 Fr cholecystostomy tube  is positioned with end coiled and locked within the central aspect of the gallbladder lumen. Post exchange cholangiogram demonstrates appropriate positioning and functionality of the new cholecystostomy tube. IMPRESSION: Successful fluoroscopic guided exchange of 10.2 Fr French cholecystostomy tube. PLAN: - The patient's cholecystostomy tube was reconnected to a gravity bag. - The  patient may return to the interventional radiology drain clinic in 8 weeks for repeat diagnostic cholangiogram/exchange. Marliss Coots, MD [facility] Electronically Signed   By: Marliss Coots M.D.   On: 10/24/2021 10:08   DG Abd Portable 1V  Result Date: 10/20/2021 CLINICAL DATA:  Feeding tube placement EXAM: PORTABLE ABDOMEN - 1 VIEW COMPARISON:  CT 4 days ago. FINDINGS: Soft feeding tube enters the stomach, loops in the fundus and has its tip in the region of the antrum. IMPRESSION: Soft feeding tube tip in the region of the antrum of the stomach. Electronically Signed   By: Paulina Fusi M.D.   On: 10/20/2021 12:29   CT ABDOMEN WO CONTRAST  Result Date: 10/18/2021 CLINICAL DATA:  Assessment of anatomy for possible gastrostomy tube placement. EXAM: CT ABDOMEN WITHOUT CONTRAST TECHNIQUE: Multidetector CT imaging of the abdomen was performed following the standard protocol without IV contrast. RADIATION DOSE REDUCTION: This exam was performed according to the departmental dose-optimization program which includes automated exposure control, adjustment of the mA and/or kV according to patient size and/or use of iterative reconstruction technique. COMPARISON:  CTA of the abdomen on 09/10/2021 FINDINGS: Lower chest: No acute abnormality. Hepatobiliary: Unremarkable unenhanced appearance of the liver. The gallbladder is decompressed and contains a cholecystostomy tube. Pancreas: Unremarkable. No pancreatic ductal dilatation or surrounding inflammatory changes. Spleen: Normal in size without focal abnormality. Adrenals/Urinary Tract: Adrenal glands and kidneys are unremarkable by unenhanced CT. Stomach/Bowel: Feeding tube extends into the stomach and terminates in the descending duodenum. No evidence of hiatal hernia. Gastric anatomy appears normal. The splenic flexure of the colon abuts the lateral aspect of the stomach but is not interposed between the  stomach and anterior abdominal wall directly. No evidence bowel obstruction, ileus or free intraperitoneal air. No bowel lesions or inflammation identified. Vascular/Lymphatic: No significant vascular findings are present. No enlarged abdominal lymph nodes. Other: No ascites or focal fluid collections in the abdomen. Midline abdominal wall hernia mesh is visualized beginning above the umbilicus and extending into the pelvis. Hernia mesh appears to be located well inferior to anticipated path of gastrostomy tube access. Musculoskeletal: No acute or significant osseous findings. IMPRESSION: 1. Gastric anatomy appears normal. The splenic flexure of the colon abuts the lateral aspect of the stomach but is not interposed between the stomach and anterior abdominal wall directly. No anatomic contraindication to possible attempt at percutaneous gastrostomy tube placement. 2. Abdominal wall hernia mesh appears to be located well inferior to anticipated path of gastrostomy tube access. 3. Cholecystostomy tube in appropriate position within decompressed gallbladder. Electronically Signed   By: Irish Lack M.D.   On: 10/18/2021 12:19   DG CHEST PORT 1 VIEW  Result Date: 10/05/2021 CLINICAL DATA:  Rectal bleeding. EXAM: PORTABLE CHEST 1 VIEW COMPARISON:  10/03/2021 FINDINGS: Tracheostomy tube tip is above the carina. There is a feeding tube with tip coursing below the level of the hemidiaphragms. Right arm PICC line tip is at the cavoatrial junction. There is a right IJ catheter with tip in the distal SVC. Lung volumes are low. No pleural effusion or airspace disease. No interstitial edema. IMPRESSION: 1. Low lung volumes. 2. Support apparatus  positioned as above. Electronically Signed   By: Kerby Moors M.D.   On: 10/05/2021 12:20   MR BRAIN W WO CONTRAST  Result Date: 10/03/2021 CLINICAL DATA:  Myelopathy. Prior MRI concerning for vasculitis or demyelinating disease. EXAM: MRI HEAD WITHOUT AND WITH CONTRAST MRI  CERVICAL SPINE WITHOUT AND WITH CONTRAST MRI CERVICAL THORACIC WITHOUT AND CONTRAST CONTRAST:  10 ml Vueway TECHNIQUE: Multiplanar, multiecho pulse sequences of the brain and surrounding structures, and cervical and thoracic spine were obtained without and with intravenous contrast. COMPARISON:  MRI Thoracic and Cervical Spine 09/23/21, MRI Head 09/20/21, MRI head 09/21/21. FINDINGS: MRI HEAD FINDINGS Brain: Redemonstrated are innumerable predominantly supratentorial contrast-enhancing lesions, which grossly look similar to prior exam from September 21, 2021. Many of the lesions are hyperintense on diffusion-weighted imaging, but no longer have a correlate on the corresponding ADC map. There is a single lesion that is new from prior exam with an ADC hypointense correlate in the posterior left frontal lobe (series 3, image 35). Direct comparison for interval development of new contrast-enhancing lesions is limited due to differences in technique, but no definite new lesions are visualized. Comparison for new T2/FLAIR hyperintense lesion is also limited due to the degree of motion artifact on the prior exam. However the extent of T2/FLAIR hyperintense disease is likely unchanged when comparing the T2 signal seen on the diffusion-weighted sequences. Vascular: Normal flow voids. Skull and upper cervical spine: Normal marrow signal. Sinuses/Orbits: Negative. Other: None. MRI CERVICAL SPINE FINDINGS Limitations: Assessment of the cervical spine is slightly limited due to the degree of motion artifact. Alignment: Physiologic. Vertebrae: No fracture, evidence of discitis, or bone lesion. Cord: Normal signal and morphology. No cord signal abnormality is visualized. Posterior Fossa, vertebral arteries, paraspinal tissues: Partially visualized tracheostomy in place. Disc levels: There is an eccentric left disc bulge at C5-C6 that narrows the left lateral recess. No evidence of high-grade spinal canal stenosis. MRI THORACIC SPINE  FINDINGS Alignment:  Physiologic. Vertebrae: No fracture, evidence of discitis, or bone lesion. Cord: Assessment of the cord is limited due to the degree of motion artifact. There are scattered T2 hyperintense lesions seen on the axial T2 weighted sequences, for example at the T4-T5 level (series 24, image 13), the T5-T6 level on the right (series 24, image 18). The lesion at the T5-T6 level may have an enhancing correlate on series 27, image 17. Paraspinal and other soft tissues: Negative. Disc levels: No evidence of high-grade spinal canal or neural foraminal stenosis. IMPRESSION: 1. Redemonstrated are innumerable predominantly supratentorial contrast-enhancing lesions, which are grossly similar to the prior exam from September 21, 2021. There is a single new hyperintense lesion on diffusion weighted imaging in the posterior left frontal lobe with an ADC hypointense correlate, which could represent a new demyelinating lesion. 2. Assessment of the cervical and thoracic spinal cord is limited due to the degree of motion artifact. There may be two T2 hyperintense lesions in the thoracic spinal cord seen on the axial T2 weighted sequences that may have correlates on the sagittal T2/STIR sequences, for example at the T4-T5 level and the T5-T6 level. The lesion at the T5-T6 level may also have a contrast enhancing correlate, as described above. Electronically Signed   By: Marin Roberts M.D.   On: 10/03/2021 16:55   MR CERVICAL SPINE W WO CONTRAST  Result Date: 10/03/2021 CLINICAL DATA:  Myelopathy. Prior MRI concerning for vasculitis or demyelinating disease. EXAM: MRI HEAD WITHOUT AND WITH CONTRAST MRI CERVICAL SPINE WITHOUT AND WITH CONTRAST  MRI CERVICAL THORACIC WITHOUT AND CONTRAST CONTRAST:  10 ml Vueway TECHNIQUE: Multiplanar, multiecho pulse sequences of the brain and surrounding structures, and cervical and thoracic spine were obtained without and with intravenous contrast. COMPARISON:  MRI Thoracic and  Cervical Spine 09/23/21, MRI Head 09/20/21, MRI head 09/21/21. FINDINGS: MRI HEAD FINDINGS Brain: Redemonstrated are innumerable predominantly supratentorial contrast-enhancing lesions, which grossly look similar to prior exam from September 21, 2021. Many of the lesions are hyperintense on diffusion-weighted imaging, but no longer have a correlate on the corresponding ADC map. There is a single lesion that is new from prior exam with an ADC hypointense correlate in the posterior left frontal lobe (series 3, image 35). Direct comparison for interval development of new contrast-enhancing lesions is limited due to differences in technique, but no definite new lesions are visualized. Comparison for new T2/FLAIR hyperintense lesion is also limited due to the degree of motion artifact on the prior exam. However the extent of T2/FLAIR hyperintense disease is likely unchanged when comparing the T2 signal seen on the diffusion-weighted sequences. Vascular: Normal flow voids. Skull and upper cervical spine: Normal marrow signal. Sinuses/Orbits: Negative. Other: None. MRI CERVICAL SPINE FINDINGS Limitations: Assessment of the cervical spine is slightly limited due to the degree of motion artifact. Alignment: Physiologic. Vertebrae: No fracture, evidence of discitis, or bone lesion. Cord: Normal signal and morphology. No cord signal abnormality is visualized. Posterior Fossa, vertebral arteries, paraspinal tissues: Partially visualized tracheostomy in place. Disc levels: There is an eccentric left disc bulge at C5-C6 that narrows the left lateral recess. No evidence of high-grade spinal canal stenosis. MRI THORACIC SPINE FINDINGS Alignment:  Physiologic. Vertebrae: No fracture, evidence of discitis, or bone lesion. Cord: Assessment of the cord is limited due to the degree of motion artifact. There are scattered T2 hyperintense lesions seen on the axial T2 weighted sequences, for example at the T4-T5 level (series 24, image 13),  the T5-T6 level on the right (series 24, image 18). The lesion at the T5-T6 level may have an enhancing correlate on series 27, image 17. Paraspinal and other soft tissues: Negative. Disc levels: No evidence of high-grade spinal canal or neural foraminal stenosis. IMPRESSION: 1. Redemonstrated are innumerable predominantly supratentorial contrast-enhancing lesions, which are grossly similar to the prior exam from September 21, 2021. There is a single new hyperintense lesion on diffusion weighted imaging in the posterior left frontal lobe with an ADC hypointense correlate, which could represent a new demyelinating lesion. 2. Assessment of the cervical and thoracic spinal cord is limited due to the degree of motion artifact. There may be two T2 hyperintense lesions in the thoracic spinal cord seen on the axial T2 weighted sequences that may have correlates on the sagittal T2/STIR sequences, for example at the T4-T5 level and the T5-T6 level. The lesion at the T5-T6 level may also have a contrast enhancing correlate, as described above. Electronically Signed   By: Marin Roberts M.D.   On: 10/03/2021 16:55   MR THORACIC SPINE W WO CONTRAST  Result Date: 10/03/2021 CLINICAL DATA:  Myelopathy. Prior MRI concerning for vasculitis or demyelinating disease. EXAM: MRI HEAD WITHOUT AND WITH CONTRAST MRI CERVICAL SPINE WITHOUT AND WITH CONTRAST MRI CERVICAL THORACIC WITHOUT AND CONTRAST CONTRAST:  10 ml Vueway TECHNIQUE: Multiplanar, multiecho pulse sequences of the brain and surrounding structures, and cervical and thoracic spine were obtained without and with intravenous contrast. COMPARISON:  MRI Thoracic and Cervical Spine 09/23/21, MRI Head 09/20/21, MRI head 09/21/21. FINDINGS: MRI HEAD FINDINGS Brain: Redemonstrated  are innumerable predominantly supratentorial contrast-enhancing lesions, which grossly look similar to prior exam from September 21, 2021. Many of the lesions are hyperintense on diffusion-weighted imaging,  but no longer have a correlate on the corresponding ADC map. There is a single lesion that is new from prior exam with an ADC hypointense correlate in the posterior left frontal lobe (series 3, image 35). Direct comparison for interval development of new contrast-enhancing lesions is limited due to differences in technique, but no definite new lesions are visualized. Comparison for new T2/FLAIR hyperintense lesion is also limited due to the degree of motion artifact on the prior exam. However the extent of T2/FLAIR hyperintense disease is likely unchanged when comparing the T2 signal seen on the diffusion-weighted sequences. Vascular: Normal flow voids. Skull and upper cervical spine: Normal marrow signal. Sinuses/Orbits: Negative. Other: None. MRI CERVICAL SPINE FINDINGS Limitations: Assessment of the cervical spine is slightly limited due to the degree of motion artifact. Alignment: Physiologic. Vertebrae: No fracture, evidence of discitis, or bone lesion. Cord: Normal signal and morphology. No cord signal abnormality is visualized. Posterior Fossa, vertebral arteries, paraspinal tissues: Partially visualized tracheostomy in place. Disc levels: There is an eccentric left disc bulge at C5-C6 that narrows the left lateral recess. No evidence of high-grade spinal canal stenosis. MRI THORACIC SPINE FINDINGS Alignment:  Physiologic. Vertebrae: No fracture, evidence of discitis, or bone lesion. Cord: Assessment of the cord is limited due to the degree of motion artifact. There are scattered T2 hyperintense lesions seen on the axial T2 weighted sequences, for example at the T4-T5 level (series 24, image 13), the T5-T6 level on the right (series 24, image 18). The lesion at the T5-T6 level may have an enhancing correlate on series 27, image 17. Paraspinal and other soft tissues: Negative. Disc levels: No evidence of high-grade spinal canal or neural foraminal stenosis. IMPRESSION: 1. Redemonstrated are innumerable  predominantly supratentorial contrast-enhancing lesions, which are grossly similar to the prior exam from September 21, 2021. There is a single new hyperintense lesion on diffusion weighted imaging in the posterior left frontal lobe with an ADC hypointense correlate, which could represent a new demyelinating lesion. 2. Assessment of the cervical and thoracic spinal cord is limited due to the degree of motion artifact. There may be two T2 hyperintense lesions in the thoracic spinal cord seen on the axial T2 weighted sequences that may have correlates on the sagittal T2/STIR sequences, for example at the T4-T5 level and the T5-T6 level. The lesion at the T5-T6 level may also have a contrast enhancing correlate, as described above. Electronically Signed   By: Marin Roberts M.D.   On: 10/03/2021 16:55   DG Abd 1 View  Result Date: 10/03/2021 CLINICAL DATA:  Aspiration into airway.  Feeding tube. EXAM: ABDOMEN - 1 VIEW COMPARISON:  09/16/2021 FINDINGS: Feeding tube tip is identified within the distal stomach. Percutaneous pigtail drainage catheter overlies the right upper quadrant of the abdomen. No dilated small bowel loops. Gaseous distension of the colon is noted which appears improved compared with 09/16/2021. IMPRESSION: 1. Feeding tube tip within the distal stomach. 2. Improved gaseous distension of the colon. Electronically Signed   By: Kerby Moors M.D.   On: 10/03/2021 06:32   DG CHEST PORT 1 VIEW  Result Date: 10/03/2021 CLINICAL DATA:  Aspiration. EXAM: PORTABLE CHEST 1 VIEW COMPARISON:  09/15/2021 FINDINGS: There is a right arm PICC line with tip in the cavoatrial junction. Tracheostomy tube tip is above the carina. There is a feeding tube  with tip below the GE junction. Stable cardiomediastinal contours. The lungs are suboptimally inflated. Asymmetric elevation of the right hemidiaphragm noted. Asymmetric airspace opacities identified within the left lower lobe concerning for pneumonia or  aspiration. IMPRESSION: 1. Persistent asymmetric airspace opacities within the left lower lobe concerning for pneumonia and or aspiration. 2. Stable support apparatus. Electronically Signed   By: Kerby Moors M.D.   On: 10/03/2021 06:31     Subjective: - no chest pain, shortness of breath, no abdominal pain, nausea or vomiting.   Discharge Exam: BP 133/80 (BP Location: Left Arm)   Pulse 84   Temp 98 F (36.7 C) (Oral)   Resp 15   Ht 6' (1.829 m)   Wt 122.3 kg   SpO2 99%   BMI 36.57 kg/m   General: Pt is alert, awake, not in acute distress Cardiovascular: RRR, S1/S2 +, no rubs, no gallops Respiratory: CTA bilaterally, no wheezing, no rhonchi Abdominal: Soft, NT, ND, bowel sounds + Extremities: no edema, no cyanosis   The results of significant diagnostics from this hospitalization (including imaging, microbiology, ancillary and laboratory) are listed below for reference.     Microbiology: No results found for this or any previous visit (from the past 240 hour(s)).   Labs: Basic Metabolic Panel: Recent Labs  Lab 10/24/21 0404 10/25/21 0300 10/26/21 0421 10/27/21 0338 10/30/21 0312  NA  --  140  --  143 140  K  --  3.6  --  3.8 4.0  CL  --  104  --  109 106  CO2  --  24  --  27 26  GLUCOSE  --  115*  --  130* 174*  BUN  --  9  --  11 10  CREATININE  --  0.68  --  0.80 0.68  CALCIUM  --  9.0  --  8.9 8.8*  MG 2.1 2.0 2.0  --  1.8  PHOS 4.5 4.0 3.8  --   --    Liver Function Tests: Recent Labs  Lab 10/30/21 0312  AST 35  ALT 24  ALKPHOS 108  BILITOT 0.6  PROT 5.9*  ALBUMIN 2.9*   CBC: Recent Labs  Lab 10/24/21 0404 10/25/21 0300 10/27/21 0338 10/30/21 0312  WBC 5.7 6.5 10.4 7.0  NEUTROABS 3.2 4.2  --   --   HGB 8.8* 9.3* 9.3* 9.6*  HCT 28.2* 30.3* 30.8* 30.4*  MCV 91.0 91.3 92.8 91.3  PLT 271 295 288 254   CBG: Recent Labs  Lab 10/29/21 0815 10/29/21 1139 10/29/21 1706 10/29/21 2032 10/30/21 0840  GLUCAP 169* 201* 282* 224* 150*   Hgb  A1c No results for input(s): "HGBA1C" in the last 72 hours. Lipid Profile No results for input(s): "CHOL", "HDL", "LDLCALC", "TRIG", "CHOLHDL", "LDLDIRECT" in the last 72 hours. Thyroid function studies No results for input(s): "TSH", "T4TOTAL", "T3FREE", "THYROIDAB" in the last 72 hours.  Invalid input(s): "FREET3" Urinalysis    Component Value Date/Time   COLORURINE AMBER (A) 09/10/2021 0532   APPEARANCEUR HAZY (A) 09/10/2021 0532   LABSPEC 1.024 09/10/2021 0532   PHURINE 5.0 09/10/2021 0532   GLUCOSEU >=500 (A) 09/10/2021 0532   GLUCOSEU >=1000 (A) 12/12/2014 1555   HGBUR NEGATIVE 09/10/2021 0532   BILIRUBINUR NEGATIVE 09/10/2021 0532   KETONESUR NEGATIVE 09/10/2021 0532   PROTEINUR 30 (A) 09/10/2021 0532   UROBILINOGEN 0.2 12/12/2014 1555   NITRITE NEGATIVE 09/10/2021 0532   LEUKOCYTESUR NEGATIVE 09/10/2021 0532    FURTHER DISCHARGE INSTRUCTIONS:   Get  Medicines reviewed and adjusted: Please take all your medications with you for your next visit with your Primary MD   Laboratory/radiological data: Please request your Primary MD to go over all hospital tests and procedure/radiological results at the follow up, please ask your Primary MD to get all Hospital records sent to his/her office.   In some cases, they will be blood work, cultures and biopsy results pending at the time of your discharge. Please request that your primary care M.D. goes through all the records of your hospital data and follows up on these results.   Also Note the following: If you experience worsening of your admission symptoms, develop shortness of breath, life threatening emergency, suicidal or homicidal thoughts you must seek medical attention immediately by calling 911 or calling your MD immediately  if symptoms less severe.   You must read complete instructions/literature along with all the possible adverse reactions/side effects for all the Medicines you take and that have been prescribed to you.  Take any new Medicines after you have completely understood and accpet all the possible adverse reactions/side effects.    Do not drive when taking Pain medications or sleeping medications (Benzodaizepines)   Do not take more than prescribed Pain, Sleep and Anxiety Medications. It is not advisable to combine anxiety,sleep and pain medications without talking with your primary care practitioner   Special Instructions: If you have smoked or chewed Tobacco  in the last 2 yrs please stop smoking, stop any regular Alcohol  and or any Recreational drug use.   Wear Seat belts while driving.   Please note: You were cared for by a hospitalist during your hospital stay. Once you are discharged, your primary care physician will handle any further medical issues. Please note that NO REFILLS for any discharge medications will be authorized once you are discharged, as it is imperative that you return to your primary care physician (or establish a relationship with a primary care physician if you do not have one) for your post hospital discharge needs so that they can reassess your need for medications and monitor your lab values.  Time coordinating discharge: 40 minutes  SIGNED:  Marzetta Board, MD, PhD 10/30/2021, 11:34 AM

## 2021-10-30 NOTE — Progress Notes (Signed)
IP rehab admissions - I have received approval for acute inpatient rehab admission from insurance carrier.  Bed available today and will admit to CIR today.  Call me for questions.  [phone]

## 2021-10-30 NOTE — Progress Notes (Addendum)
PMR Admission Coordinator Pre-Admission Assessment   Patient: Troy Randall is an 61 y.o., male MRN: [id number] DOB: [birth year] Height: 6' (182.9 cm) Weight: 122.3 kg   Insurance Information HMO:     PPO: yes     PCP:      IPA:      80/20:      OTHER:  PRIMARYJessica Priest      Policy#: [id number]      Subscriber: Pt. CM Name: [name]     Phone#: [phone]     Fax#: [phone] Pre-Cert#: [id number] approved by Dwayne for 7 days from 10/25 to 11/04/21 with update due 11/05/21     Employer: Disabled Benefits:  Phone #: [phone]     Name: Verified Irene Shipper Date: 01/06/2020- still active Deductible: does not have OOP Max: $5,900 ($522.70 met) CIR: $375/day co-pay with a max co-pay of $1,875/admission (5 days) SNF: $0/day co-pay for days 1-20, $196/day co-pay for days 21-100; limited to 100 days/cal yr. Outpatient:  $35/visit co-pay Home Health:  80% coverage; 20% co-insurance DME: 80% coverage; 20% co-insurance Providers: in network   SECONDARY:       Policy#:      Phone#:    Development worker, community:       Phone#:    The Engineer, petroleum" for patients in Inpatient Rehabilitation Facilities with attached "Privacy Act [place] Records" was provided and verbally reviewed with: Patient   Emergency Contact Information Contact Information       Name Relation Home Work Mobile    [place] Spouse     [phone]           Current Medical History  Patient Admitting Diagnosis: Debility , GI bleed, Respiratory Failure, possible MS   History of Present Illness:  Patient is a 61 y/o male who initially presented to the [facility] ED on 09/04/21 with dark stools, dizziness and SOB. Patient was transferred to [facility] on 09/06/21.  Found to have GI bleed and acute anemia s/p embolization 09/06/21 and percutaneous cholecystostomy 09/07/21. Intubated 9/3, CRRT 9/5-9/8. Hemorrhagic and septic shock 9/5. Trach placed 9/11. 9/16 MRI showed rapid development  numerous round white matter lesions in both cerebral hemispheres; 9/17 MRI with numerous white matter lesions consistent with active demyelination - concerning for MS, ADEM, and cerebral vasculitis; On  9/18, Pt underwent lumbar puncture. Pt returned to ICU on 9/29 with aspiration PNA, tachypnea, tachycardia and increased WOB. On 10/2,  pt transferred back to hospitalist service but started having GIB. Underwent sigmoidoscopy on 10/07/2021. Plasma exchange started. Pt decanulated. Son 10/19. He also underwent cholecystostomy tube exchange 10/24/21. PT/OT/SLP all saw Pt. And recommend CIR to assist return to PLOF.    Patient's medical record from [facility] has been reviewed by the rehabilitation admission coordinator and physician.   Past Medical History      Past Medical History:  Diagnosis Date   Anemia     Arthritis     CVA (cerebral vascular accident) ([place])     Diabetes mellitus without complication ([place])     Hyperlipidemia     Hypertension     Kidney stones        Has the patient had major surgery during 100 days prior to admission? Yes   Family History   family history includes Cancer in an other family member; Diabetes in his father; Heart attack in an other family member; Heart failure in his mother.   Current Medications   Current Facility-Administered Medications:  0.9 %  sodium chloride infusion, , Intravenous, PRN, [name], MD, Stopped at 10/07/21 1454   acetaminophen (TYLENOL) tablet 650 mg, 650 mg, Oral, Q6H PRN, Geradine Girt, DO, 650 mg at 10/30/21 0253   Chlorhexidine Gluconate Cloth 2 % PADS 6 each, 6 each, Topical, Daily, Kathie Dike, MD, 6 each at 10/29/21 0962   docusate sodium (COLACE) capsule 100 mg, 100 mg, Oral, BID, Darliss Cheney, MD, 100 mg at 10/28/21 8366   doxazosin (CARDURA) tablet 2 mg, 2 mg, Oral, Daily, Jennye Boroughs, MD, 2 mg at 10/30/21 1013   enoxaparin (LOVENOX) injection 40 mg, 40 mg, Subcutaneous, Q24H, Dimple Nanas, RPH, 40 mg at 10/29/21 1409   feeding supplement (GLUCERNA SHAKE) (GLUCERNA SHAKE) liquid 237 mL, 237 mL, Oral, TID BM, Jennye Boroughs, MD, 237 mL at 10/29/21 2050   food thickener (SIMPLYTHICK (NECTAR/LEVEL 2/MILDLY THICK)) 10 packet, 10 packet, Oral, PRN, Darliss Cheney, MD   hydrALAZINE (APRESOLINE) injection 10 mg, 10 mg, Intravenous, Q6H PRN, Laqueta Jean, MD   influenza vac split quadrivalent PF (FLUARIX) injection 0.5 mL, 0.5 mL, Intramuscular, Tomorrow-1000, Pokhrel, Laxman, MD   insulin aspart (novoLOG) injection 0-5 Units, 0-5 Units, Subcutaneous, QHS, Vann, Jessica U, DO, 2 Units at 10/29/21 2100   insulin aspart (novoLOG) injection 0-9 Units, 0-9 Units, Subcutaneous, TID WC, Vann, Jessica U, DO, 5 Units at 10/29/21 1710   insulin glargine-yfgn (SEMGLEE) injection 15 Units, 15 Units, Subcutaneous, BID, Jennye Boroughs, MD, 15 Units at 10/29/21 2100   lidocaine (LIDODERM) 5 % 1 patch, 1 patch, Transdermal, Q24H, Vann, Jessica U, DO, 1 patch at 10/29/21 0913   ondansetron (ZOFRAN) tablet 4 mg, 4 mg, Oral, Q6H PRN **OR** ondansetron (ZOFRAN) injection 4 mg, 4 mg, Intravenous, Q6H PRN, Kathie Dike, MD, 4 mg at 10/26/21 0415   Oral care mouth rinse, 15 mL, Mouth Rinse, 4 times per day, Freddi Starr, MD, 15 mL at 10/29/21 2100   Oral care mouth rinse, 15 mL, Mouth Rinse, PRN, Freddi Starr, MD   pantoprazole (PROTONIX) 2 mg/mL oral suspension 40 mg, 40 mg, Oral, Daily, Vann, Jessica U, DO, 40 mg at 10/30/21 1012   pneumococcal 23 valent vaccine (PNEUMOVAX-23) injection 0.5 mL, 0.5 mL, Intramuscular, Tomorrow-1000, Pokhrel, Laxman, MD   polyethylene glycol (MIRALAX / GLYCOLAX) packet 17 g, 17 g, Oral, Daily, Jennye Boroughs, MD, 17 g at 10/28/21 0828   rosuvastatin (CRESTOR) tablet 40 mg, 40 mg, Oral, Daily, Jennye Boroughs, MD, 40 mg at 10/30/21 1015   sodium chloride flush (NS) 0.9 % injection 10-40 mL, 10-40 mL, Intracatheter, Q12H, Spero Geralds, MD, 10 mL at  10/29/21 2100   sodium chloride flush (NS) 0.9 % injection 10-40 mL, 10-40 mL, Intracatheter, PRN, Spero Geralds, MD, 10 mL at 10/26/21 2145   Zinc Oxide (TRIPLE PASTE) 12.8 % ointment, , Topical, BID, Margaretha Seeds, MD, Given at 10/29/21 2100   Patients Current Diet:  Diet Order                  DIET DYS 3 Room service appropriate? Yes; Fluid consistency: Nectar Thick  Diet effective now                         Precautions / Restrictions Precautions Precautions: Fall Precaution Comments: R drain Restrictions Weight Bearing Restrictions: No Other Position/Activity Restrictions: pt unable to sit on bottom for extended periods of time due to pain (even on geomat)  Has the patient had 2 or more falls or a fall with injury in the past year? No   Prior Activity Level Community (5-7x/wk): Pt active in the community PTA   Prior Functional Level Self Care: Did the patient need help bathing, dressing, using the toilet or eating? Independent   Indoor Mobility: Did the patient need assistance with walking from room to room (with or without device)? Independent   Stairs: Did the patient need assistance with internal or external stairs (with or without device)? Independent   Functional Cognition: Did the patient need help planning regular tasks such as shopping or remembering to take medications? Independent   Patient Information Are you of Hispanic, Latino/a,or Spanish origin?: A. No, not of Hispanic, Latino/a, or Spanish origin What is your race?: A. White Do you need or want an interpreter to communicate with a doctor or health care staff?: 0. No   Patient's Response To:  Health Literacy and Transportation Is the patient able to respond to health literacy and transportation needs?: Yes Health Literacy - How often do you need to have someone help you when you read instructions, pamphlets, or other written material from your doctor or pharmacy?: Never In the past 12  months, has lack of transportation kept you from medical appointments or from getting medications?: No In the past 12 months, has lack of transportation kept you from meetings, work, or from getting things needed for daily living?: No   Development worker, international aid / [place] Devices/Equipment: CBG Meter Home Equipment: Wheelchair - manual   Prior Device Use: Indicate devices/aids used by the patient prior to current illness, exacerbation or injury? None of the above   Current Functional Level Cognition   Overall Cognitive Status: Impaired/Different from baseline Difficult to assess due to: Tracheostomy, Impaired communication Current Attention Level: Selective Orientation Level: Oriented to person, Oriented to place, Oriented to situation, Disoriented to time Following Commands: Follows one step commands with increased time, Follows one step commands inconsistently Safety/Judgement: Decreased awareness of safety, Decreased awareness of deficits General Comments: requires hand over hand to manage his use of UE's with transfers to walker    Extremity Assessment (includes Sensation/Coordination)   Upper Extremity Assessment: Generalized weakness RUE Deficits / Details: reached to nose and maintained grasp on chair in front of him in sitting, propped on R arm in sitting, raised arm above head in supine and waved hand spontaneously LUE Deficits / Details: pt able to use LUE for balance with RW  Lower Extremity Assessment: Defer to PT evaluation RLE Deficits / Details: Able to wiggle toes, no other active movement noted to command, spontaneous movement noted extending in bed LLE Deficits / Details: Not able to wiggle toes, no active movement observed during eval     ADLs   Overall ADL's : Needs assistance/impaired Eating/Feeding: Total assistance, Bed level Eating/Feeding Details (indicate cue type and reason): can bring finger foods and cup to mouth Grooming: Wash/dry face,  Moderate assistance, Sitting Grooming Details (indicate cue type and reason): required mod assist to initate washing face and was able to continue without assistance Upper Body Dressing : Moderate assistance, Sitting Upper Body Dressing Details (indicate cue type and reason): to donn gown to cover back Lower Body Dressing: Bed level, Maximal assistance Lower Body Dressing Details (indicate cue type and reason): to donn socks Toilet Transfer: Maximal assistance, +2 for physical assistance Functional mobility during ADLs: +2 for physical assistance, Minimal assistance, Rolling walker (2 wheels), Cueing for safety, Cueing for sequencing  General ADL Comments: focused on bed mobility and transfers     Mobility   Overal bed mobility: Needs Assistance Bed Mobility: Supine to Sit, Sit to Supine Rolling: Mod assist Sidelying to sit: Max assist, +2 for safety/equipment, +2 for physical assistance, HOB elevated Supine to sit: Max assist Sit to supine: Max assist (.) Sit to sidelying: +2 for physical assistance, Max assist General bed mobility comments: requires repetitive attempts as PT is starting him to side of bed alone     Transfers   Overall transfer level: Needs assistance Equipment used: Rolling walker (2 wheels) Transfers: Sit to/from Stand Sit to Stand: Min assist, +2 physical assistance, +2 safety/equipment Bed to/from chair/wheelchair/BSC transfer type:: Via Public house manager via Lift Equipment: [employer] transfer comment: verbal and tactile cues for hand placement to stand and sit.  Assitance managing RW     Ambulation / Gait / Stairs / Wheelchair Mobility   Ambulation/Gait Ambulation/Gait assistance: Min assist, +2 physical assistance, +2 safety/equipment, Mod assist Gait Distance (Feet): 95 Feet (35+60) Assistive device: Rolling walker (2 wheels), 2 person hand held assist, Pushed wheelchair Gait Pattern/deviations: Decreased stride length, Wide base of support,  Step-through pattern General Gait Details: 2 min to occas mod when LLE buckles, and has close chair follow to avoid a complete fall Gait velocity: decreased Gait velocity interpretation: <1.31 ft/sec, indicative of household ambulator Pre-gait activities: standing balance correction     Posture / Balance Dynamic Sitting Balance Sitting balance - Comments: prefers UE support Balance Overall balance assessment: Needs assistance Sitting-balance support: Feet supported Sitting balance-Leahy Scale: Fair Sitting balance - Comments: prefers UE support Postural control: Right lateral lean Standing balance support: Bilateral upper extremity supported, During functional activity Standing balance-Leahy Scale: Poor Standing balance comment: Requires UE support in standing.     Special needs/care consideration None    Previous Home Environment (from acute therapy documentation) Living Arrangements: Spouse/significant other Available Help at Discharge: Family, Available PRN/intermittently Type of Home: House Home Layout: Two level, Laundry or work area in basement Alternate Level Stairs-Rails: Left Alternate Level Stairs-Number of Steps: chair lift down to basement Home Access: Stairs to enter Entrance Stairs-Rails: None Technical brewer of Steps: 1 Bathroom Shower/Tub: Gaffer, Chiropodist: Handicapped height Bathroom Accessibility: Yes Saks: No   Discharge Living Setting Plans for Discharge Living Setting: Patient's home Type of Home at Discharge: House Discharge [place]: One level Discharge Home Access: Stairs to enter Entrance Stairs-Rails: None Entrance Stairs-Number of Steps: 1 Discharge Bathroom Shower/Tub: Walk-in shower Discharge Bathroom Toilet: Standard Discharge Bathroom Accessibility: No Does the patient have any problems obtaining your medications?: No   Social/Family/Support Systems Patient Roles: Spouse Contact  Information: [phone] Anticipated Caregiver: Aimee Timmons Ability/Limitations of Caregiver: Min A Caregiver Availability: 24/7 Discharge Plan Discussed with Primary Caregiver: Yes Is Caregiver In Agreement with Plan?: Yes Does Caregiver/Family have Issues with Lodging/Transportation while Pt is in Rehab?: No   Goals Patient/Family Goal for Rehab: PT/OT/SLP Supervision Expected length of stay: 13-16 days Pt/Family Agrees to Admission and willing to participate: Yes Program Orientation Provided & Reviewed with Pt/Caregiver Including Roles  & Responsibilities: Yes   Decrease burden of Care through IP rehab admission: not anticipated   Possible need for SNF placement upon discharge: not anticipated   Patient Condition: I have reviewed medical records from [facility], spoken with CM, and patient and spouse. I met with patient at the bedside for inpatient rehabilitation assessment.  Patient will benefit from ongoing  PT and OT, can actively participate in 3 hours of therapy a day 5 days of the week, and can make measurable gains during the admission.  Patient will also benefit from the coordinated team approach during an Inpatient Acute Rehabilitation admission.  The patient will receive intensive therapy as well as Rehabilitation physician, nursing, social worker, and care management interventions.  Due to safety, skin/wound care, disease management, medication administration, pain management, and patient education the patient requires 24 hour a day rehabilitation nursing.  The patient is currently min to max assist with mobility and basic ADLs.  Discharge setting and therapy post discharge at home with home health is anticipated.  Patient has agreed to participate in the Acute Inpatient Rehabilitation Program and will admit today.   Preadmission Screen Completed By:  Retta Diones, 10/30/2021 10:17 AM ______________________________________________________________________    Discussed status with Dr. Dagoberto Ligas on 10/30/21 at 0945 and received approval for admission today.   Admission Coordinator:  Retta Diones, RN, time 1016/Date 10/30/21    Assessment/Plan: Diagnosis: Does the need for close, 24 hr/day Medical supervision in concert with the patient's rehab needs make it unreasonable for this patient to be served in a less intensive setting? Yes Co-Morbidities requiring supervision/potential complications: probable new dx of MS; GI bleed s/p 19 units pRBCs; percutaneous chole drain; decnanulated trach; urinary retention- foley Due to bladder management, bowel management, safety, skin/wound care, disease management, medication administration, pain management, and patient education, does the patient require 24 hr/day rehab nursing? Yes Does the patient require coordinated care of a physician, rehab nurse, PT, OT, and SLP to address physical and functional deficits in the context of the above medical diagnosis(es)? Yes Addressing deficits in the following areas: balance, endurance, locomotion, strength, transferring, bowel/bladder control, bathing, dressing, feeding, grooming, toileting, cognition, speech, language, and swallowing Can the patient actively participate in an intensive therapy program of at least 3 hrs of therapy 5 days a week? Yes The potential for patient to make measurable gains while on inpatient rehab is good and fair Anticipated functional outcomes upon discharge from inpatient rehab: supervision PT, supervision OT, supervision and min assist SLP Estimated rehab length of stay to reach the above functional goals is: 13-16 days Anticipated discharge destination: Home 10. Overall Rehab/Functional Prognosis: good and fair     MD Signature:           Revision History                                                    Routing History             Note Details  Jan Fireman, MD File Time 10/30/2021 11:05 AM  Author Type  Physician Status Signed  Last Editor Courtney Heys, MD Service CASE MANAGEMENT

## 2021-10-30 NOTE — Progress Notes (Signed)
Patient ID: Troy Randall, male   DOB: [birth year], 61 y.o.   MRN: [id number] INPATIENT REHABILITATION ADMISSION NOTE   Arrival Method: bed     Mental Orientation: alert and oriented x2   Assessment: done per flowsheet    Skin: small stage 2 on sacrum covered with zinc paste and foam    IV'S: none    Pain: 0/10   Tubes and Drains: choley drain and foley present on admission   Safety Measures: 3 side rails and bed alarm activated    Vital Signs: done per flowsheet    Height and Weight: 6'0 and 265 lbs   Rehab Orientation: completed    Family: wife at bedside     Notes:

## 2021-10-30 NOTE — H&P (Signed)
Physical Medicine and Rehabilitation Admission H&P    CC: Functional deficits secondary to acute disseminated encephalomyelitis, prolonged hospitalization secondary to GI bleed and acute respiratory failure  HPI: Troy Randall is a 61 year old R handed  male who presented to [facility] emergency department on 09/04/2021 reporting hematochezia associated with diarrhea and abdominal cramping.  He has a past medical history of CVA x2 ( had fully recovered from both- latest 4/23) maintained on Plavix and aspirin.  He had had a normal colonoscopy in February 2023.  Gastroenterology was consulted.  He underwent EGD with deployment of endoscopic capsule and colonoscopy on 9/2.  Interventional radiology was consulted and he underwent coil of the jejunal branch of the SMA on 9/2. Active bleeding noted in the proximal jejunum which was tattooed and hemostatic clip placed.  He underwent flexible sigmoidoscopy on 9/15 to retrieve endoscopic capsule and colonoscopy was performed on 10/3 with findings of large superficial ulceration of the sigmoid colon.  This was biopsied and consistent with chronic colitis.          He required approximately 20 units of packed red blood cells.  His hemoglobin is stable and improving. His hospitalization was complicated by cholecystitis and he underwent percutaneous cholecystostomy tube placement, acute renal failure requiring CRRT and respiratory failure with subsequent tracheostomy 9/11.  LFTs and renal function are normal. CT head performed secondary to acute encephalopathy and history of prior CVA.  MRI of the brain performed secondary to multiple interval white matter insults versus inflammatory process.  Neurology consultation obtained on 9/17.  The patient underwent laboratory work-up including lumbar puncture.  He was treated with IV steroids with mild improvement.  He then underwent full course of plasma exchange completed on 10/11.  MRI completed of the cervical and  thoracic spine.  He will follow-up with neurology as outpatient.   His Foley catheter was removed on 10/9 however he was unable to void spontaneously and his catheter was reinserted 10/10.  He continues on doxazosin.  He has a history of diabetes mellitus and diabetic coordinator consulted.  Home diabetic medications not yet restarted.  Has findings of stage II medial sacral ulcer.  Renal function has normalized.  He is tolerating dysphagia 3 diet with nectar thick liquids.The patient requires inpatient physical medicine and rehabilitation evaluations and treatment secondary to dysfunction due to encephalopathy, prolonged hospitalization secondary to GI bleed, respiratory failure and kidney failure.  Wife at bedside. She and her adult son and daughter help with care as does patient's brother.  Denies pain right now- but having pain in back, esp at night- 0/10 right now, but gets up to 8/10 at night.  Uses tylenol and lidoderm- but they've been using lidoderm during day, not night time.  Wife thinks back pain made worse by being in bed so long- and has hx of 2 herniated disc in low back- took Aleve at home, but obviously cannot take ever since latest CVA in 4/23 when put on Plavix _ also trying to avoid opiates due to encephalopathy.  Also has B/L shoulder pain- goes to [facility] for B/L shoulder injections- last injections ~ 1 year ago per wife.   Wife said not drinking enough- doesn't "love' the nectar thick liquids- has been drinking about 4-5 of small containers/day. LBM this AM.    Review of Systems  Reason unable to perform ROS: limited due to cognition.  Constitutional:  Negative for fever.  Cardiovascular:  Negative for chest pain.  Gastrointestinal:  Negative for  nausea and vomiting.  Genitourinary:        Indwelling Foley  Musculoskeletal:  Positive for back pain.  Neurological:  Positive for weakness.       "Not thinking clearly"  All other systems reviewed and are  negative.  Past Medical History:  Diagnosis Date   Anemia    Arthritis    CVA (cerebral vascular accident) ([place])    Diabetes mellitus without complication ([place])    Hyperlipidemia    Hypertension    Kidney stones    Past Surgical History:  Procedure Laterality Date   ANKLE SURGERY     BIOPSY  09/10/2021   Procedure: BIOPSY;  Surgeon: Carol Ada, MD;  Location: [facility] ENDOSCOPY;  Service: Gastroenterology;;   BIOPSY  10/07/2021   Procedure: BIOPSY;  Surgeon: Carol Ada, MD;  Location: [facility] ENDOSCOPY;  Service: Gastroenterology;;   COLONOSCOPY N/A 10/07/2021   Procedure: COLONOSCOPY;  Surgeon: Carol Ada, MD;  Location: [place];  Service: Gastroenterology;  Laterality: N/A;   COLONOSCOPY WITH PROPOFOL N/A 09/06/2021   Procedure: COLONOSCOPY WITH PROPOFOL;  Surgeon: Harvel Quale, MD;  [facility];  Service: Gastroenterology;  Laterality: N/A;   ENTEROSCOPY N/A 09/10/2021   Procedure: ENTEROSCOPY;  Surgeon: Carol Ada, MD;  Location: [place];  Service: Gastroenterology;  Laterality: N/A;   ENTEROSCOPY  09/06/2021   Procedure: ENTEROSCOPY;  Surgeon: Harvel Quale, MD;  [facility];  Service: Gastroenterology;;   ESOPHAGOGASTRODUODENOSCOPY (EGD) WITH PROPOFOL  09/06/2021   Procedure: ESOPHAGOGASTRODUODENOSCOPY (EGD) WITH PROPOFOL;  Surgeon: Harvel Quale, MD;  [facility];  Service: Gastroenterology;;   [place] N/A 09/19/2021   Procedure: Beryle Quant;  Surgeon: Carol Ada, MD;  Location: [place];  Service: Gastroenterology;  Laterality: N/A;   FOREIGN BODY REMOVAL  09/19/2021   Procedure: FOREIGN BODY REMOVAL;  Surgeon: Carol Ada, MD;  Location: [place];  Service: Gastroenterology;;   Freda Munro CAPSULE STUDY  09/06/2021   Procedure: GIVENS CAPSULE STUDY;  Surgeon: Harvel Quale, MD;  [facility];  Service: Gastroenterology;;   HERNIA REPAIR      umbilical x1 Incisional x1   INCISIONAL HERNIA REPAIR  04/13/2011   Procedure: LAPAROSCOPIC INCISIONAL HERNIA;  Surgeon: Jamesetta So, MD;  Location: [facility];  Service: General;  Laterality: N/A;  Recurrent Laparoscopic Incisional Herniorraphy with Mesh   IR ANGIOGRAM SELECTIVE EACH ADDITIONAL VESSEL  09/09/2021   IR ANGIOGRAM VISCERAL SELECTIVE  09/07/2021   IR ANGIOGRAM VISCERAL SELECTIVE  09/06/2021   IR ANGIOGRAM VISCERAL SELECTIVE  09/06/2021   IR EMBO ART  VEN HEMORR LYMPH EXTRAV  INC GUIDE ROADMAPPING  09/06/2021   IR EXCHANGE BILIARY DRAIN  10/24/2021   IR GUIDED DRAIN W CATHETER PLACEMENT  09/07/2021   IR US GUIDE BX ASP/DRAIN  09/07/2021   IR US GUIDE VASC ACCESS RIGHT  09/07/2021   IR US GUIDE VASC ACCESS RIGHT  09/06/2021   KIDNEY STONE SURGERY     Family History  Problem Relation Age of Onset   Heart failure Mother    Diabetes Father    Cancer Other    Heart attack Other    Anesthesia problems Neg Hx    Hypotension Neg Hx    Malignant hyperthermia Neg Hx    Pseudochol deficiency Neg Hx    Colon cancer Neg Hx    Inflammatory bowel disease Neg Hx    Social History:  reports that he has never smoked. His smokeless tobacco use includes snuff. He reports  that he does not drink alcohol and does not use drugs. Allergies:  Allergies  Allergen Reactions   Metformin And Related     GI upset   Penicillins Hives   Medications Prior to Admission  Medication Sig Dispense Refill   amLODipine (NORVASC) 10 MG tablet Take 1 tablet (10 mg total) by mouth daily. 90 tablet 3   aspirin EC 81 MG tablet Take 81 mg by mouth daily. Swallow whole.     clopidogrel (PLAVIX) 75 MG tablet Take 1 tablet (75 mg total) by mouth daily. 30 tablet 0   glipiZIDE (GLUCOTROL XL) 10 MG 24 hr tablet Take 1 tablet (10 mg total) by mouth daily with breakfast. 90 tablet 1   levocetirizine (XYZAL) 5 MG tablet Take 1 tablet by mouth at bedtime.     lisinopril (PRINIVIL,ZESTRIL) 40 MG tablet Take 1 tablet (40 mg total) by  mouth daily. 90 tablet 3   metFORMIN (GLUCOPHAGE-XR) 500 MG 24 hr tablet Take 500-1,000 mg by mouth in the morning and at bedtime.     rosuvastatin (CRESTOR) 40 MG tablet Take 1 tablet (40 mg total) by mouth daily. 30 tablet 1   tamsulosin (FLOMAX) 0.4 MG CAPS capsule Take 0.4 mg by mouth daily.     traZODone (DESYREL) 150 MG tablet Take 150 mg by mouth at bedtime.     TRESIBA FLEXTOUCH 200 UNIT/ML FlexTouch Pen Inject 20 Units into the skin in the morning and at bedtime.     BD VEO INSULIN SYRINGE U/F 31G X 15/64" 1 ML MISC  USE AS DIRECTED 100 each 2   glucose blood (ONETOUCH VERIO) test strip TEST twice a day 100 each 3   Insulin Pen Needle (NOVOTWIST) 32G X 5 MM MISC Use two daily to inject Victoza and Toujeo. 44 each 5   ONETOUCH DELICA LANCETS 99991111 MISC Use to check blood sugar once a day dx code E11.65 50 each 3      Home: Home Living Family/patient expects to be discharged to:: Private residence Living Arrangements: Spouse/significant other Available Help at Discharge: Family, Available PRN/intermittently Type of Home: House Home Access: Stairs to enter Technical brewer of Steps: 1 Entrance Stairs-Rails: None Home Layout: Two level, Laundry or work area in basement Alternate Therapist, sports of Steps: chair lift down to basement Alternate Level Stairs-Rails: Left Bathroom Shower/Tub: Gaffer, Chiropodist: Handicapped height Bathroom Accessibility: Yes Home Equipment: Wheelchair - manual   Functional History: Prior Function Prior Level of Function : Independent/Modified Independent Mobility Comments: independent ambulation; pt drives ADLs Comments: independent  Functional Status:  Mobility: Bed Mobility Overal bed mobility: Needs Assistance Bed Mobility: Supine to Sit, Sit to Supine Rolling: Mod assist Sidelying to sit: Max assist, +2 for safety/equipment, +2 for physical assistance, HOB elevated Supine to sit: Max assist Sit to  supine: Max assist (.) Sit to sidelying: +2 for physical assistance, Max assist General bed mobility comments: requires repetitive attempts as PT is starting him to side of bed alone Transfers Overall transfer level: Needs assistance Equipment used: Rolling walker (2 wheels) Transfers: Sit to/from Stand Sit to Stand: Min assist, +2 physical assistance, +2 safety/equipment Bed to/from chair/wheelchair/BSC transfer type:: Via Public house manager via Lift Equipment: [employer] transfer comment: verbal and tactile cues for hand placement to stand and sit.  Assitance managing RW Ambulation/Gait Ambulation/Gait assistance: Min assist, +2 physical assistance, +2 safety/equipment, Mod assist Gait Distance (Feet): 95 Feet (35+60) Assistive device: Rolling walker (2 wheels), 2 person hand held assist,  Pushed wheelchair Gait Pattern/deviations: Decreased stride length, Wide base of support, Step-through pattern General Gait Details: 2 min to occas mod when LLE buckles, and has close chair follow to avoid a complete fall Gait velocity: decreased Gait velocity interpretation: <1.31 ft/sec, indicative of household ambulator Pre-gait activities: standing balance correction    ADL: ADL Overall ADL's : Needs assistance/impaired Eating/Feeding: Total assistance, Bed level Eating/Feeding Details (indicate cue type and reason): can bring finger foods and cup to mouth Grooming: Wash/dry face, Moderate assistance, Sitting Grooming Details (indicate cue type and reason): required mod assist to initate washing face and was able to continue without assistance Upper Body Dressing : Moderate assistance, Sitting Upper Body Dressing Details (indicate cue type and reason): to donn gown to cover back Lower Body Dressing: Bed level, Maximal assistance Lower Body Dressing Details (indicate cue type and reason): to donn socks Toilet Transfer: Maximal assistance, +2 for physical assistance Functional mobility  during ADLs: +2 for physical assistance, Minimal assistance, Rolling walker (2 wheels), Cueing for safety, Cueing for sequencing General ADL Comments: focused on bed mobility and transfers  Cognition: Cognition Overall Cognitive Status: Impaired/Different from baseline Orientation Level: Oriented to person, Oriented to place, Oriented to situation, Disoriented to time Cognition Arousal/Alertness: Awake/alert Behavior During Therapy: Flat affect Overall Cognitive Status: Impaired/Different from baseline Area of Impairment: Problem solving, Awareness, Safety/judgement, Following commands, Attention Current Attention Level: Selective Following Commands: Follows one step commands with increased time, Follows one step commands inconsistently Safety/Judgement: Decreased awareness of safety, Decreased awareness of deficits Awareness: Intellectual Problem Solving: Slow processing, Decreased initiation, Difficulty sequencing, Requires verbal cues, Requires tactile cues General Comments: requires hand over hand to manage his use of UE's with transfers to walker Difficult to assess due to: Tracheostomy, Impaired communication  Physical Exam: Blood pressure 133/80, pulse 84, temperature 98 F (36.7 C), temperature source Oral, resp. rate 15, height 6' (1.829 m), weight 122.3 kg, SpO2 99 %. Physical Exam Vitals and nursing note reviewed. Exam conducted with a chaperone present.  Constitutional:      General: He is not in acute distress.    Comments: Delayed responses/sleepy/supine in bed; kept appearing to fall asleep/closing eyes; irritable when tried to keep awake; wife at bedside, NAD  HENT:     Head: Normocephalic and atraumatic.     Comments: No obvious facial droop- unable to smile/stick tongue out- said "was trying" but then didn't follow command    Right Ear: External ear normal.     Left Ear: External ear normal.     Nose: Nose normal. No congestion.     Mouth/Throat:     Mouth: Mucous  membranes are dry.     Pharynx: Oropharynx is clear. No oropharyngeal exudate.  Eyes:     General:        Right eye: No discharge.        Left eye: No discharge.     Conjunctiva/sclera: Conjunctivae normal.     Comments: Couldn't follow commands to look anywhere but directly ahead- not clear if unable to move eyes, or just unable to follow commands- irritable about being asked to do so x3.   Neck:     Comments: Trach site has scab Cardiovascular:     Rate and Rhythm: Normal rate and regular rhythm.     Heart sounds: Normal heart sounds. No murmur heard.    No gallop.  Pulmonary:     Effort: Pulmonary effort is normal. No respiratory distress.     Breath sounds: Normal breath  sounds. No wheezing, rhonchi or rales.     Comments: Decreased breath sounds at bases B/L Abdominal:     General: Bowel sounds are normal. There is no distension.     Palpations: Abdomen is soft.     Tenderness: There is no abdominal tenderness. There is no guarding.     Comments: Percutaneous chole drain- draining small to moderate amounts of what appears to be liquid stool.   Genitourinary:    Comments: Foley in place- bright yellow urine in foley bag Musculoskeletal:        General: Normal range of motion.     Cervical back: Neck supple. No tenderness.     Comments: RUE- proximally ~ 4/5; distally ~ 3+/5 LUE- ~ 4-/5 proximally, and 2/5 distally, but not sure due to not following commands on L side/L neglect RLE- 4+/5- kept crossing R leg over L leg LLE- very little movement without command, and with command HF 3+/5; PF 3-/5; unable to participate in KE/KF/DF part of exam  Skin:    General: Skin is warm and dry.     Comments: Small fissure of gluteal cleft. Yellow exudate with satellite lesions RUQ drain as per GI notation Trach site almost completely healed- has scab Dressing C/D/I from PICC removal  Neurological:     Mental Status: He is alert.     Comments: Very little interaction by pt- irritated  that being asked to participate in exam Kept closing eyes- ignoring interviewer- when asked repeatedly to participate, I'm trying", but then didn't follow commands Very sleepy- wife says he's acting "like normal right now".  Ox1- knows in hospital but says doesn't remember why L neglect noted and perseveration also noted- couldn't get onto next part of exam Loses concentration easily  Psychiatric:     Comments: Extremely flat     Results for orders placed or performed during the hospital encounter of 09/04/21 (from the past 48 hour(s))  Glucose, capillary     Status: Abnormal   Collection Time: 10/28/21 11:58 AM  Result Value Ref Range   Glucose-Capillary 220 (H) 70 - 99 mg/dL    Comment: Glucose reference range applies only to samples taken after fasting for at least 8 hours.  Glucose, capillary     Status: Abnormal   Collection Time: 10/28/21  4:10 PM  Result Value Ref Range   Glucose-Capillary 258 (H) 70 - 99 mg/dL    Comment: Glucose reference range applies only to samples taken after fasting for at least 8 hours.  Glucose, capillary     Status: Abnormal   Collection Time: 10/28/21  9:18 PM  Result Value Ref Range   Glucose-Capillary 252 (H) 70 - 99 mg/dL    Comment: Glucose reference range applies only to samples taken after fasting for at least 8 hours.  Glucose, capillary     Status: Abnormal   Collection Time: 10/29/21  8:15 AM  Result Value Ref Range   Glucose-Capillary 169 (H) 70 - 99 mg/dL    Comment: Glucose reference range applies only to samples taken after fasting for at least 8 hours.  Glucose, capillary     Status: Abnormal   Collection Time: 10/29/21 11:39 AM  Result Value Ref Range   Glucose-Capillary 201 (H) 70 - 99 mg/dL    Comment: Glucose reference range applies only to samples taken after fasting for at least 8 hours.  Glucose, capillary     Status: Abnormal   Collection Time: 10/29/21  5:06 PM  Result Value Ref  Range   Glucose-Capillary 282 (H) 70 - 99  mg/dL    Comment: Glucose reference range applies only to samples taken after fasting for at least 8 hours.  Glucose, capillary     Status: Abnormal   Collection Time: 10/29/21  8:32 PM  Result Value Ref Range   Glucose-Capillary 224 (H) 70 - 99 mg/dL    Comment: Glucose reference range applies only to samples taken after fasting for at least 8 hours.  Comprehensive metabolic panel     Status: Abnormal   Collection Time: 10/30/21  3:12 AM  Result Value Ref Range   Sodium 140 135 - 145 mmol/L   Potassium 4.0 3.5 - 5.1 mmol/L   Chloride 106 98 - 111 mmol/L   CO2 26 22 - 32 mmol/L   Glucose, Bld 174 (H) 70 - 99 mg/dL    Comment: Glucose reference range applies only to samples taken after fasting for at least 8 hours.   BUN 10 6 - 20 mg/dL   Creatinine, Ser 0.68 0.61 - 1.24 mg/dL   Calcium 8.8 (L) 8.9 - 10.3 mg/dL   Total Protein 5.9 (L) 6.5 - 8.1 g/dL   Albumin 2.9 (L) 3.5 - 5.0 g/dL   AST 35 15 - 41 U/L   ALT 24 0 - 44 U/L   Alkaline Phosphatase 108 38 - 126 U/L   Total Bilirubin 0.6 0.3 - 1.2 mg/dL   GFR, Estimated >60 >60 mL/min    Comment: (NOTE) Calculated using the CKD-EPI Creatinine Equation (2021)    Anion gap 8 5 - 15    Comment: Performed at [facility], [place] [address], [place], [place] [address]  CBC     Status: Abnormal   Collection Time: 10/30/21  3:12 AM  Result Value Ref Range   WBC 7.0 4.0 - 10.5 K/uL   RBC 3.33 (L) 4.22 - 5.81 MIL/uL   Hemoglobin 9.6 (L) 13.0 - 17.0 g/dL   HCT 30.4 (L) 39.0 - 52.0 %   MCV 91.3 80.0 - 100.0 fL   MCH 28.8 26.0 - 34.0 pg   MCHC 31.6 30.0 - 36.0 g/dL   RDW 14.9 11.5 - 15.5 %   Platelets 254 150 - 400 K/uL   nRBC 0.0 0.0 - 0.2 %    Comment: Performed at [facility], [place] [address], [place], [place] [address]  Magnesium     Status: None   Collection Time: 10/30/21  3:12 AM  Result Value Ref Range   Magnesium 1.8 1.7 - 2.4 mg/dL    Comment: Performed at [place] [address] [place], [place]  [address]  Glucose, capillary     Status: Abnormal   Collection Time: 10/30/21  8:40 AM  Result Value Ref Range   Glucose-Capillary 150 (H) 70 - 99 mg/dL    Comment: Glucose reference range applies only to samples taken after fasting for at least 8 hours.   No results found.    Blood pressure 133/80, pulse 84, temperature 98 F (36.7 C), temperature source Oral, resp. rate 15, height 6' (1.829 m), weight 122.3 kg, SpO2 99 %.  Medical Problem List and Plan: 1. Functional deficits secondary to acute disseminated encephalomyelitis, prolonged hospitalization secondary to GI bleed. acute respiratory failure, and cholecystitis.  -patient may  shower- cover drain  -ELOS/Goals: 2.5-3.5 weeks 2.  Antithrombotics: -DVT/anticoagulation:  Pharmaceutical: Lovenox  -antiplatelet therapy: home Plavix and aspirin on hold due to GI bleed 3. Pain Management: Tylenol,  Lidoderm patch as needed 4. Mood/Behavior/Sleep: LCSW to evaluate and provide emotional support  -antipsychotic agents: n/a 5. Neuropsych/cognition: This patient is not capable of making decisions on his own behalf. 6. Skin/Wound Care: Routine skin checks  -sacral fissure with likely Candidiasis. Apply Nystatin powder.  -continue butt cream to anal area 7. Fluids/Electrolytes/Nutrition: Routine Is and Os and follow-up chemistries 8: Cholecystis s/p cholecystostomy tube; exchanged on 10/24/21  -surgical follow-up at discharge 9: DM: carb mod diet; CBGs q AC and q HS  -continue Semglee 15 units BID  -continue SSI q AC and q HS  -home Glucotrol XL 10 mg and Tresiba 20 units BID not restarted 10: Hypertension: monitor; home amlodipine 10 mg and lisinopril 40 mg not restarted  -on doxazosin 2 mg daily for urinary retention 11: Urinary retention with Foley re-inserted 10/10  -continue doxazosin  -? restart home Flomax 12: Hyperlipidemia: continue Crestor 13: GIB/GI prophylaxis: s/p 19-20 units pRBCs- continue Protonix  - monitor H and  H  -follow-up with GI as outpatient 14: Chronic colitis by biopsy; unknown etiology  -follow-up with GI as outpatient 15: Acute respiratory failure s/p trach; decannulated 10/19 16. Probable MS-s/p high dose steroids and Plasma exchange-  17. Encephalopathy- impaired cognition- monitor and might benefit from meds/intervention 18. Low back pain and B/L shoulder pain- will change lidoderm patches to 3 8pm to 8am and con't tylenol- Goes to [facility] for injections- avoid opiates due to sedation/encephalopathy 19. Impaired intake/Dysphagia- on D3 nectart thick liquids- encouraged to drink 6-8 cups/day- I.e more nectar thick liquids.   I have personally performed a face to face diagnostic evaluation of this patient and formulated the key components of the plan.  Additionally, I have personally reviewed laboratory data, imaging studies, as well as relevant notes and concur with the physician assistant's documentation above.   The patient's status has not changed from the original H&P.  Any changes in documentation from the acute care chart have been noted above.      Barbie Banner, PA-C 10/30/2021

## 2021-10-30 NOTE — Progress Notes (Signed)
Inpatient Rehabilitation Admission Medication Review by a Pharmacist  A complete drug regimen review was completed for this patient to identify any potential clinically significant medication issues.  High Risk Drug Classes Is patient taking? Indication by Medication  Antipsychotic Yes Compazine prn nausea  Anticoagulant Yes Lovenox for VTE prophylaxis  Antibiotic No   Opioid No   Antiplatelet No   Hypoglycemics/insulin Yes Semglee and sliding scale insulin for hyperglycemia  Vasoactive Medication Yes Doxazosin for BPH  Chemotherapy No   Other Yes Constipation: Colace, Miralax, Milk of Magnesia prn, Sorbitol prn, Fleet prn  Pain: Lidoderm, Tylenol, Robaxin prn GI prophylaxis: Protonix Hyperlipidemia: Crestor Sleep: trazodone prn Itching: Benadryl prn Indigestion: Maalox prn Cough: Robitussin prn Candida: Nystatin powder        Type of Medication Issue Identified Description of Issue Recommendation(s)  Drug Interaction(s) (clinically significant)     Duplicate Therapy     Allergy     No Medication Administration End Date     Incorrect Dose     Additional Drug Therapy Needed     Significant med changes from prior encounter (inform family/care partners about these prior to discharge).    Other  PTA Plavix and aspirin on hold due to GI Bleed Started Lovenox F/u to restart if appropriate  Monitor for bleeding    Clinically significant medication issues were identified that warrant physician communication and completion of prescribed/recommended actions by midnight of the next day:  No   Pharmacist comments: Plavix and aspirin on hold due to GI bleed, f/u restart.   Time spent performing this drug regimen review (minutes):  30   Thank you for allowing [place] to participate in this patients care. Jens Som, PharmD 10/30/2021 3:59 PM  **Pharmacist phone directory can be found on [email or web address] listed under [place]**

## 2021-10-31 LAB — COMPREHENSIVE METABOLIC PANEL
ALT: 24 U/L (ref 0–44)
AST: 23 U/L (ref 15–41)
Albumin: 3.1 g/dL — ABNORMAL LOW (ref 3.5–5.0)
Alkaline Phosphatase: 125 U/L (ref 38–126)
Anion gap: 8 (ref 5–15)
BUN: 7 mg/dL (ref 6–20)
CO2: 27 mmol/L (ref 22–32)
Calcium: 9 mg/dL (ref 8.9–10.3)
Chloride: 106 mmol/L (ref 98–111)
Creatinine, Ser: 0.73 mg/dL (ref 0.61–1.24)
GFR, Estimated: >60 mL/min (ref >60)
Glucose, Bld: 165 mg/dL — ABNORMAL HIGH (ref 70–99)
Potassium: 4 mmol/L (ref 3.5–5.1)
Sodium: 141 mmol/L (ref 135–145)
Total Bilirubin: 0.6 mg/dL (ref 0.3–1.2)
Total Protein: 6.2 g/dL — ABNORMAL LOW (ref 6.5–8.1)

## 2021-10-31 LAB — CBC WITH DIFFERENTIAL/PLATELET
Abs Immature Granulocytes: 0.13 10*3/uL — ABNORMAL HIGH (ref 0.00–0.07)
Basophils Absolute: 0.1 10*3/uL (ref 0.0–0.1)
Basophils Relative: 0 %
Eosinophils Absolute: 0.2 10*3/uL (ref 0.0–0.5)
Eosinophils Relative: 1 %
HCT: 33.2 % — ABNORMAL LOW (ref 39.0–52.0)
Hemoglobin: 10 g/dL — ABNORMAL LOW (ref 13.0–17.0)
Immature Granulocytes: 1 %
Lymphocytes Relative: 14 %
Lymphs Abs: 1.7 10*3/uL (ref 0.7–4.0)
MCH: 27.1 pg (ref 26.0–34.0)
MCHC: 30.1 g/dL (ref 30.0–36.0)
MCV: 90 fL (ref 80.0–100.0)
Monocytes Absolute: 0.7 10*3/uL (ref 0.1–1.0)
Monocytes Relative: 5 %
Neutro Abs: 10 10*3/uL — ABNORMAL HIGH (ref 1.7–7.7)
Neutrophils Relative %: 79 %
Platelets: 272 10*3/uL (ref 150–400)
RBC: 3.69 MIL/uL — ABNORMAL LOW (ref 4.22–5.81)
RDW: 14.8 % (ref 11.5–15.5)
WBC: 12.7 10*3/uL — ABNORMAL HIGH (ref 4.0–10.5)
nRBC: 0 % (ref 0.0–0.2)

## 2021-10-31 LAB — GLUCOSE, CAPILLARY
Glucose-Capillary: 165 mg/dL — ABNORMAL HIGH (ref 70–99)
Glucose-Capillary: 179 mg/dL — ABNORMAL HIGH (ref 70–99)
Glucose-Capillary: 204 mg/dL — ABNORMAL HIGH (ref 70–99)
Glucose-Capillary: 158 mg/dL — ABNORMAL HIGH (ref 70–99)

## 2021-10-31 LAB — C DIFFICILE QUICK SCREEN W PCR REFLEX
C Diff antigen: NEGATIVE
C Diff interpretation: NOT DETECTED
C Diff toxin: NEGATIVE

## 2021-10-31 LAB — PHOSPHORUS: Phosphorus: 4 mg/dL (ref 2.5–4.6)

## 2021-10-31 LAB — MAGNESIUM: Magnesium: 1.9 mg/dL (ref 1.7–2.4)

## 2021-10-31 MED ORDER — CHLORHEXIDINE GLUCONATE CLOTH 2 % EX PADS
6.0000 | MEDICATED_PAD | Freq: Two times a day (BID) | CUTANEOUS | Status: DC
  Administered 2021-10-31 – 2021-11-06 (×12): 6 via TOPICAL

## 2021-10-31 MED ORDER — CHLORHEXIDINE GLUCONATE CLOTH 2 % EX PADS: 6.0000 | MEDICATED_PAD | Freq: Every day | CUTANEOUS | Status: DC

## 2021-10-31 NOTE — Evaluation (Signed)
Speech Language Pathology Assessment and Plan  Patient Details  Name: Troy Randall MRN: [id number] Date of Birth: [birth year]  SLP Diagnosis: Cognitive Impairments;Dysphagia;Speech and Language deficits  Rehab Potential: Good ELOS: 3 weeks   Today's Date: 10/31/2021 SLP Individual Time: 0263-7858 SLP Individual Time Calculation (min): 72 min  Hospital Problem: Principal Problem:   Encephalomyelopathy Active Problems:   MS (multiple sclerosis) ([place])  Past Medical History:  Past Medical History:  Diagnosis Date   Anemia    Arthritis    CVA (cerebral vascular accident) ([place])    Diabetes mellitus without complication ([place])    Hyperlipidemia    Hypertension    Kidney stones    Past Surgical History:  Past Surgical History:  Procedure Laterality Date   ANKLE SURGERY     BIOPSY  09/10/2021   Procedure: BIOPSY;  Surgeon: Carol Ada, MD;  Location: [place];  Service: Gastroenterology;;   BIOPSY  10/07/2021   Procedure: BIOPSY;  Surgeon: Carol Ada, MD;  Location: [place];  Service: Gastroenterology;;   COLONOSCOPY N/A 10/07/2021   Procedure: COLONOSCOPY;  Surgeon: Carol Ada, MD;  Location: [place];  Service: Gastroenterology;  Laterality: N/A;   COLONOSCOPY WITH PROPOFOL N/A 09/06/2021   Procedure: COLONOSCOPY WITH PROPOFOL;  Surgeon: Harvel Quale, MD;  [facility];  Service: Gastroenterology;  Laterality: N/A;   ENTEROSCOPY N/A 09/10/2021   Procedure: ENTEROSCOPY;  Surgeon: Carol Ada, MD;  Location: [place];  Service: Gastroenterology;  Laterality: N/A;   ENTEROSCOPY  09/06/2021   Procedure: ENTEROSCOPY;  Surgeon: Harvel Quale, MD;  [facility];  Service: Gastroenterology;;   ESOPHAGOGASTRODUODENOSCOPY (EGD) WITH PROPOFOL  09/06/2021   Procedure: ESOPHAGOGASTRODUODENOSCOPY (EGD) WITH PROPOFOL;  Surgeon: Harvel Quale, MD;  [facility];  Service: Gastroenterology;;   [place] N/A 09/19/2021   Procedure: Beryle Quant;  Surgeon: Carol Ada, MD;  Location: [place];  Service: Gastroenterology;  Laterality: N/A;   FOREIGN BODY REMOVAL  09/19/2021   Procedure: FOREIGN BODY REMOVAL;  Surgeon: Carol Ada, MD;  Location: [place];  Service: Gastroenterology;;   Freda Munro CAPSULE STUDY  09/06/2021   Procedure: GIVENS CAPSULE STUDY;  Surgeon: Harvel Quale, MD;  [facility];  Service: Gastroenterology;;   HERNIA REPAIR     umbilical x1 Incisional x1   INCISIONAL HERNIA REPAIR  04/13/2011   Procedure: LAPAROSCOPIC INCISIONAL HERNIA;  Surgeon: Jamesetta So, MD;  Location: [facility];  Service: General;  Laterality: N/A;  Recurrent Laparoscopic Incisional Herniorraphy with Mesh   IR ANGIOGRAM SELECTIVE EACH ADDITIONAL VESSEL  09/09/2021   IR ANGIOGRAM VISCERAL SELECTIVE  09/07/2021   IR ANGIOGRAM VISCERAL SELECTIVE  09/06/2021   IR ANGIOGRAM VISCERAL SELECTIVE  09/06/2021   IR EMBO ART  VEN HEMORR LYMPH EXTRAV  INC GUIDE ROADMAPPING  09/06/2021   IR EXCHANGE BILIARY DRAIN  10/24/2021   IR GUIDED DRAIN W CATHETER PLACEMENT  09/07/2021   IR US GUIDE BX ASP/DRAIN  09/07/2021   IR US GUIDE VASC ACCESS RIGHT  09/07/2021   IR US GUIDE VASC ACCESS RIGHT  09/06/2021   KIDNEY STONE SURGERY      Assessment / Plan / Recommendation Clinical Impression Pt is a 61 y.o. male with history of stroke in 4/23, HTN, HLD and DM with recent severe jejunal bleed s/p vessel coiling who is being seen for altered mental status and concerning white matter changes on MRI. Patient's wife reports that after his stroke in 4/23 he made a good recovery  and was able to perform ADLs. He had some residual dysarthria. Pt presented to [facility] on 8/31 with GI bleeding and has remained persistently altered. Intubated 9/3 and trach placed 9/11. He currently has #6 cuffed trach.      Troy Randall is a 61 year old R handed male who presented to [facility] emergency  department on 09/04/2021 reporting hematochezia associated with diarrhea and abdominal cramping. He has a past medical history of CVA x2 ( had fully recovered from both- latest 4/23). Pt admitted to CIR for  Functional deficits secondary to acute disseminated encephalomyelitis, prolonged hospitalization secondary to GI bleed and acute respiratory failure. CT head performed secondary to acute encephalopathy and history of prior CVA.  MRI of the brain performed secondary to multiple interval white matter insults versus inflammatory process. He is tolerating dysphagia 3 diet with nectar thick liquids.The patient requires inpatient physical medicine and rehabilitation evaluations and treatment secondary to dysfunction due to encephalopathy, prolonged hospitalization secondary to GI bleed, respiratory failure and kidney failure.  SLP consulted to complete cognitive-linguistic evaluation and clinical swallow evaluation (CSE) in the setting of encephalopathy. Pt presents with severe cognitive-linguistic deficits in all domains of cognition as evident by achieving a score of 1/30 on SLUMS (n = 27) and further informal assessment. Of note, restlessness, fatigue, impaired focused attention, distractibility, and difficulty following commands impacted functional participation during today's evaluation. Pt's verbal expression and motor speech appear grossly intact for all tasks assessed. Comprehension appeared affected by distraction to pain, attention, and working memory deficits. As a result of these functional deficits, pt required overall max-to-total A multimodal cues for cognitive task completion.  OME was difficult to obtain due to difficulty following commands. Pt was able to participate in BSE revealing mildly prolonged mastication and good oral clearance with dysphagia 3 solids. Pt consumed nectar thick liquids by cup without overt s/sx of aspiration. Recommend continuation of dys 3 diet with nectar thick liquids  and crushed medications. Full supervision and feeding assistance needed. Spouse educated on swallowing precautions and verbalized understanding through teach back - signed off to provide assist during meals.    Skilled ST intervention is recommended to address cognitive-linguistic and swallow skills and functional independence. Anticipate pt will require 24 hour supervision and would benefit from ongoing SLP services in home health setting at discharge. Pt and spouse verbalized understanding and agreement with plan.    Skilled Therapeutic Interventions          CSE, SLUMS and informal assessment measures administered. Please see report for full details.  SLP Assessment  Patient will need skilled Speech Lanaguage Pathology Services during CIR admission    Recommendations  SLP Diet Recommendations: Dysphagia 3 (Mech soft);Nectar Liquid Administration via: Cup;Straw Medication Administration: Crushed with puree Supervision: Full supervision/cueing for compensatory strategies;Trained caregiver to feed patient Compensations: Slow rate;Small sips/bites;Lingual sweep for clearance of pocketing;Monitor for anterior loss;Minimize environmental distractions Postural Changes and/or Swallow Maneuvers: Seated upright 90 degrees Oral Care Recommendations: Oral care BID Patient destination: Home Follow up Recommendations: 24 hour supervision/assistance;Home Health SLP Equipment Recommended: To be determined    SLP Frequency 3 to 5 out of 7 days   SLP Duration  SLP Intensity  SLP Treatment/Interventions 3 weeks  Minumum of 1-2 x/day, 30 to 90 minutes  Cognitive remediation/compensation;Environmental controls;Internal/external aids;Speech/Language facilitation;Functional tasks;Dysphagia/aspiration precaution training;Patient/family education;Therapeutic Activities    Pain Pain Assessment Pain Scale: 0-10 Pain Score: 0-No pain  Prior Functioning Type of Home: House  Lives With: Spouse Available  Help at Discharge:  Family;Available PRN/intermittently  SLP Evaluation Cognition Overall Cognitive Status: Impaired/Different from baseline Arousal/Alertness: Lethargic Orientation Level: Oriented to person;Disoriented to time;Disoriented to situation;Disoriented to place Year:  (unable despite being given field of choices) Month:  (unable despote being given field of choices) Day of Week: Incorrect Attention: Focused;Sustained Focused Attention: Impaired Focused Attention Impairment: Verbal basic Sustained Attention: Impaired Sustained Attention Impairment: Verbal basic Memory: Impaired Memory Impairment: Storage deficit;Decreased recall of new information;Decreased short term memory Decreased Short Term Memory: Verbal basic Awareness: Impaired Awareness Impairment: Intellectual impairment Problem Solving: Impaired Problem Solving Impairment: Verbal basic;Functional basic Behaviors: Restless Safety/Judgment: Impaired  Comprehension Auditory Comprehension Overall Auditory Comprehension: Impaired Yes/No Questions: Impaired Basic Biographical Questions: 51-75% accurate Basic Immediate Environment Questions: 50-74% accurate Commands: Impaired One Step Basic Commands: 0-24% accurate Interfering Components: Attention;Pain;Processing speed;Working Field seismologist: Conservation officer, nature: Not tested Reading Comprehension Reading Status: Not tested Expression Expression Primary Mode of Expression: Verbal Verbal Expression Overall Verbal Expression: Appears within functional limits for tasks assessed Initiation: No impairment Oral Motor Oral Motor/Sensory Function Overall Oral Motor/Sensory Function:  (unable to follow commands) Motor Speech Overall Motor Speech: Appears within functional limits for tasks assessed  Care Tool Care Tool Cognition Ability to hear (with hearing aid or hearing appliances if  normally used Ability to hear (with hearing aid or hearing appliances if normally used): 0. Adequate - no difficulty in normal conservation, social interaction, listening to TV   Expression of Ideas and Wants Expression of Ideas and Wants: 2. Frequent difficulty - frequently exhibits difficulty with expressing needs and ideas   Understanding Verbal and Non-Verbal Content Understanding Verbal and Non-Verbal Content: 2. Sometimes understands - understands only basic conversations or simple, direct phrases. Frequently requires cues to understand  Memory/Recall Ability Memory/Recall Ability : That he or she is in a hospital/hospital unit   PMSV Assessment  PMSV Trial    Bedside Swallowing Assessment General Date of Onset: 09/04/21 Previous Swallow Assessment: 10/13 FEES Diet Prior to this Study: Dysphagia 3 (soft);Nectar-thick liquids Temperature Spikes Noted: No Respiratory Status: Room air History of Recent Intubation: Yes Behavior/Cognition: Distractible Oral Cavity - Dentition: Adequate natural dentition (difficult to fully assess) Patient Positioning: Upright in bed Baseline Vocal Quality: Normal Volitional Cough: Weak Volitional Swallow: Unable to elicit  Oral Care Assessment Oral Assessment  (WDL): Exceptions to WDL Lips: Symmetrical Teeth: Intact Tongue: Pink;Moist Mucous Membrane(s): Moist;Pink Saliva: Moist, saliva free flowing Level of Consciousness: Alert Is patient on any of following O2 devices?: None of the above Nutritional status: On thickened liquids Oral Assessment Risk : High Risk Ice Chips Ice chips: Not tested Thin Liquid Thin Liquid: Not tested Nectar Thick Nectar Thick Liquid: Within functional limits Honey Thick Honey Thick Liquid: Not tested Puree Puree: Within functional limits Solid Solid: Impaired Oral Phase Impairments: Impaired mastication Oral Phase Functional Implications: Impaired mastication;Oral residue BSE Assessment Risk for  Aspiration Impact on safety and function: Mild aspiration risk;Moderate aspiration risk Other Related Risk Factors: Cognitive impairment;Prolonged intubation;Deconditioning;Decreased respiratory status;Decreased management of secretions;Lethargy  Short Term Goals: Week 1: SLP Short Term Goal 1 (Week 1): Pt will orient x4 with max A verbal/visual cues with use of external aids SLP Short Term Goal 2 (Week 1): Pt will sustain attention to functional tasks for 3 minute intervals with max A verbal/visual redirection cues SLP Short Term Goal 3 (Week 1): Patient will complete basic, famliar tasks with max A verbal/visual cues for problem solving skills SLP Short Term Goal 4 (Week 1): Patient will follow 1-step commands  with max A verbal/visual cues to achieve 50% accuracy SLP Short Term Goal 5 (Week 1): Patient will consume current diet with minimal overt s/sx of aspiration with mod A verbal cues for safe swallowing compensatory strategies  Refer to Care Plan for Long Term Goals  Recommendations for other services: None   Discharge Criteria: Patient will be discharged from SLP if patient refuses treatment 3 consecutive times without medical reason, if treatment goals not met, if there is a change in medical status, if patient makes no progress towards goals or if patient is discharged from hospital.  The above assessment, treatment plan, treatment alternatives and goals were discussed and mutually agreed upon: by patient and by family  Patty Sermons 10/31/2021, 5:58 PM

## 2021-10-31 NOTE — Progress Notes (Signed)
Inpatient Rehabilitation  Patient information reviewed and entered into eRehab system by Lanny Lipkin Dejon Lukas, OTR/L, Rehab Quality Coordinator.   Information including medical coding, functional ability and quality indicators will be reviewed and updated through discharge.   

## 2021-10-31 NOTE — Evaluation (Signed)
Occupational Therapy Assessment and Plan  Patient Details  Name: Troy Randall MRN: [id number] Date of Birth: 07/10/1960  OT Diagnosis: apraxia, cognitive deficits, disturbance of vision, muscular wasting and disuse atrophy, and muscle weakness (generalized) Rehab Potential: Rehab Potential (ACUTE ONLY): Good ELOS: 3 weeks   Today's Date: 10/31/2021 OT Individual Time: 0935-1050 OT Individual Time Calculation (min): 75 min     Hospital Problem: Principal Problem:   Encephalomyelopathy Active Problems:   MS (multiple sclerosis) ([place])   Past Medical History:  Past Medical History:  Diagnosis Date   Anemia    Arthritis    CVA (cerebral vascular accident) ([place])    Diabetes mellitus without complication ([place])    Hyperlipidemia    Hypertension    Kidney stones    Past Surgical History:  Past Surgical History:  Procedure Laterality Date   ANKLE SURGERY     BIOPSY  09/10/2021   Procedure: BIOPSY;  Surgeon: Carol Ada, MD;  Location: [place];  Service: Gastroenterology;;   BIOPSY  10/07/2021   Procedure: BIOPSY;  Surgeon: Carol Ada, MD;  Location: [place];  Service: Gastroenterology;;   COLONOSCOPY N/A 10/07/2021   Procedure: COLONOSCOPY;  Surgeon: Carol Ada, MD;  Location: [place];  Service: Gastroenterology;  Laterality: N/A;   COLONOSCOPY WITH PROPOFOL N/A 09/06/2021   Procedure: COLONOSCOPY WITH PROPOFOL;  Surgeon: Harvel Quale, MD;  [facility];  Service: Gastroenterology;  Laterality: N/A;   ENTEROSCOPY N/A 09/10/2021   Procedure: ENTEROSCOPY;  Surgeon: Carol Ada, MD;  Location: [place];  Service: Gastroenterology;  Laterality: N/A;   ENTEROSCOPY  09/06/2021   Procedure: ENTEROSCOPY;  Surgeon: Harvel Quale, MD;  [facility];  Service: Gastroenterology;;   ESOPHAGOGASTRODUODENOSCOPY (EGD) WITH PROPOFOL  09/06/2021   Procedure: ESOPHAGOGASTRODUODENOSCOPY (EGD) WITH PROPOFOL;  Surgeon: Harvel Quale, MD;  [facility];  Service: Gastroenterology;;   [place] N/A 09/19/2021   Procedure: Beryle Quant;  Surgeon: Carol Ada, MD;  Location: [place];  Service: Gastroenterology;  Laterality: N/A;   FOREIGN BODY REMOVAL  09/19/2021   Procedure: FOREIGN BODY REMOVAL;  Surgeon: Carol Ada, MD;  Location: [facility];  Service: Gastroenterology;;   Freda Munro CAPSULE STUDY  09/06/2021   Procedure: GIVENS CAPSULE STUDY;  Surgeon: Harvel Quale, MD;  [facility];  Service: Gastroenterology;;   HERNIA REPAIR     umbilical x1 Incisional x1   INCISIONAL HERNIA REPAIR  04/13/2011   Procedure: LAPAROSCOPIC INCISIONAL HERNIA;  Surgeon: Jamesetta So, MD;  Location: [facility];  Service: General;  Laterality: N/A;  Recurrent Laparoscopic Incisional Herniorraphy with Mesh   IR ANGIOGRAM SELECTIVE EACH ADDITIONAL VESSEL  09/09/2021   IR ANGIOGRAM VISCERAL SELECTIVE  09/07/2021   IR ANGIOGRAM VISCERAL SELECTIVE  09/06/2021   IR ANGIOGRAM VISCERAL SELECTIVE  09/06/2021   IR EMBO ART  VEN HEMORR LYMPH EXTRAV  INC GUIDE ROADMAPPING  09/06/2021   IR EXCHANGE BILIARY DRAIN  10/24/2021   IR GUIDED DRAIN W CATHETER PLACEMENT  09/07/2021   IR US GUIDE BX ASP/DRAIN  09/07/2021   IR US GUIDE VASC ACCESS RIGHT  09/07/2021   IR US GUIDE VASC ACCESS RIGHT  09/06/2021   KIDNEY STONE SURGERY      Assessment & Plan Clinical Impression:  Troy Randall is a 61 year old R handed  male who presented to [facility] emergency department on 09/04/2021 reporting hematochezia associated with diarrhea and abdominal cramping.  He has a past medical history of CVA x2 ( had  fully recovered from both- latest 4/23) maintained on Plavix and aspirin.  He had had a normal colonoscopy in February 2023.  Gastroenterology was consulted.  He underwent EGD with deployment of endoscopic capsule and colonoscopy on 9/2.  Interventional radiology was consulted and he underwent coil of the jejunal  branch of the SMA on 9/2. Active bleeding noted in the proximal jejunum which was tattooed and hemostatic clip placed.  He underwent flexible sigmoidoscopy on 9/15 to retrieve endoscopic capsule and colonoscopy was performed on 10/3 with findings of large superficial ulceration of the sigmoid colon.  This was biopsied and consistent with chronic colitis.          He required approximately 20 units of packed red blood cells.  His hemoglobin is stable and improving. His hospitalization was complicated by cholecystitis and he underwent percutaneous cholecystostomy tube placement, acute renal failure requiring CRRT and respiratory failure with subsequent tracheostomy 9/11.  LFTs and renal function are normal. CT head performed secondary to acute encephalopathy and history of prior CVA.  MRI of the brain performed secondary to multiple interval white matter insults versus inflammatory process.  Neurology consultation obtained on 9/17.  The patient underwent laboratory work-up including lumbar puncture.  He was treated with IV steroids with mild improvement.  He then underwent full course of plasma exchange completed on 10/11.  MRI completed of the cervical and thoracic spine.  He will follow-up with neurology as outpatient.   His Foley catheter was removed on 10/9 however he was unable to void spontaneously and his catheter was reinserted 10/10.  He continues on doxazosin.  He has a history of diabetes mellitus and diabetic coordinator consulted.  Home diabetic medications not yet restarted.  Has findings of stage II medial sacral ulcer.  Renal function has normalized.  He is tolerating dysphagia 3 diet with nectar thick liquids.The patient requires inpatient physical medicine and rehabilitation evaluations and treatment secondary to dysfunction due to encephalopathy, prolonged hospitalization secondary to GI bleed, respiratory failure and kidney failure.   Wife at bedside. She and her adult son and daughter help  with care as does patient's brother. Patient transferred to CIR on 10/30/2021 .    Patient currently requires max to total with basic self-care skills secondary to muscle weakness, decreased cardiorespiratoy endurance, motor apraxia, decreased visual acuity, decreased visual perceptual skills, and decreased visual motor skills, left side neglect, decreased initiation, decreased attention, decreased awareness, decreased problem solving, decreased safety awareness, decreased memory, and delayed processing, and decreased sitting balance, decreased standing balance, decreased postural control, and decreased balance strategies.  Prior to hospitalization on August 31, patient was fully independent.  Patient will benefit from skilled intervention to increase independence with basic self-care skills prior to discharge home with care partner.  Anticipate patient will require minimal physical assistance and follow up home health.  OT - End of Session Endurance Deficit: Yes OT Assessment Rehab Potential (ACUTE ONLY): Good OT Patient demonstrates impairments in the following area(s): Balance;Cognition;Endurance;Motor;Perception;Safety;Sensory;Vision OT Basic ADL's Functional Problem(s): Eating;Grooming;Bathing;Dressing;Toileting OT Transfers Functional Problem(s): Toilet;Tub/Shower OT Additional Impairment(s): Fuctional Use of Upper Extremity OT Plan OT Intensity: Minimum of 1-2 x/day, 45 to 90 minutes OT Frequency: 5 out of 7 days OT Duration/Estimated Length of Stay: 3 weeks OT Treatment/Interventions: Balance/vestibular training;Cognitive remediation/compensation;DME/adaptive equipment instruction;Discharge planning;Functional mobility training;Neuromuscular re-education;Psychosocial support;Patient/family education;Self Care/advanced ADL retraining;UE/LE Strength taining/ROM;Therapeutic Exercise;Therapeutic Activities;UE/LE Coordination activities;Visual/perceptual remediation/compensation OT Self Feeding  Anticipated Outcome(s): mod I OT Basic Self-Care Anticipated Outcome(s): CGA with bathing and UB dressing, min A LB  dressing OT Toileting Anticipated Outcome(s): min A OT Bathroom Transfers Anticipated Outcome(s): min A OT Recommendation Patient destination: Home Follow Up Recommendations: Home health OT Equipment Recommended: To be determined   OT Evaluation Precautions/Restrictions  Precautions Precautions: Fall Precaution Comments: R drain Restrictions Weight Bearing Restrictions: No    Vital Signs Therapy Vitals Temp: 98.2 F (36.8 C) Temp Source: Oral Pulse Rate: (Abnormal) 101 Resp: 19 BP: 125/79 Patient Position (if appropriate): Sitting Oxygen Therapy SpO2: 99 % O2 Device: Room Air Pain Pain Assessment Pain Score: 0-No pain Home Living/Prior Functioning Home Living Living Arrangements: Spouse/significant other Available Help at Discharge: Family, Available PRN/intermittently Type of Home: House Home Access: Stairs to enter Technical brewer of Steps: 1 Entrance Stairs-Rails: None Home Layout: Two level, Laundry or work area in basement Alternate Therapist, sports of Steps: chair lift down to basement; pt and wife live in his father's old house that was set up for the father. Have grab bars in the bathroom and built in shower seat. Bathroom Shower/Tub: Gaffer  Lives With: Spouse Vision Baseline Vision/History: 1 Wears glasses (only reading) Ability to See in Adequate Light: 2 Moderately impaired (unable to read the clock) Patient Visual Report: No change from baseline Vision Assessment?: Vision impaired- to be further tested in functional context Additional Comments: pt unable to follow directions for visual assessment but he was adequately tracking and visually attending to this therapist and his spouse Perception  Perception: Impaired Inattention/Neglect: Does not attend to left side of body;Does not attend to left visual  field Praxis Praxis: Impaired Praxis Impairment Details: Motor planning Praxis-Other Comments: max cues with donning clothing Cognition Cognition Overall Cognitive Status: Impaired/Different from baseline Arousal/Alertness: Awake/alert Orientation Level: Person Memory: Impaired Memory Impairment: Storage deficit;Decreased recall of new information;Decreased short term memory Awareness: Impaired Awareness Impairment: Intellectual impairment Problem Solving: Impaired Problem Solving Impairment: Verbal basic;Functional basic Safety/Judgment: Impaired Comments: pt very cooperative in OT session Brief Interview for Mental Status (BIMS) Repetition of Three Words (First Attempt): 3 Temporal Orientation: Year: Correct Temporal Orientation: Month: No answer (pt knew it was fall) Temporal Orientation: Day: No answer Recall: "Sock": No, could not recall Recall: "Blue": No, could not recall Recall: "Bed": No, could not recall BIMS Summary Score: 6 Sensation Sensation Light Touch: Impaired by gross assessment (kept stating I was touching his wrist when I was touching his elbow or shoulder) Hot/Cold: Not tested Proprioception: Impaired by gross assessment Stereognosis: Not tested Coordination Gross Motor Movements are Fluid and Coordinated: No Fine Motor Movements are Fluid and Coordinated: No Coordination and Movement Description: pt not actively using LUE/LLE. He appears to have functional strength  but severe L neglect. Finger Nose Finger Test: unable to follow directions for test Motor  Motor Motor - Skilled Clinical Observations: generalized motor weakness  Trunk/Postural Assessment  Thoracic Assessment Thoracic Assessment: Exceptions to [facility] (kyphotic posture in sitting) Lumbar Assessment Lumbar Assessment: Exceptions to [facility] (posterior pelvic tilt) Postural Control Postural Control: Deficits on evaluation Trunk Control: left lean in sitting, held posture fairly well when  perched in stedy  Balance Dynamic Sitting Balance Dynamic Sitting - Level of Assistance: 2: Max assist Static Standing Balance Static Standing - Level of Assistance: Other (comment) (in stedy pt able to hold a static stand using UE support on bar with min A) Extremity/Trunk Assessment RUE Assessment RUE Assessment: Within Functional Limits LUE Assessment Active Range of Motion (AROM) Comments: WFL, but pt does not actively use his arm and needs max cues to fully attend to it General  Strength Comments: during functional movement and with cues pt able to resist some movement, appears to be 4-/5  Care Tool Care Tool Self Care Eating   Eating Assist Level: Minimal Assistance - Patient > 75%    Oral Care    Oral Care Assist Level: Moderate Assistance - Patient 50 - 74%    Bathing   Body parts bathed by patient: Chest;Abdomen;Face;Left arm;Right arm Body parts bathed by helper: Front perineal area;Buttocks;Right upper leg;Left upper leg;Right lower leg;Left lower leg   Assist Level: Maximal Assistance - Patient 24 - 49% (max cues)    Upper Body Dressing(including orthotics)   What is the patient wearing?: Pull over shirt   Assist Level: Maximal Assistance - Patient 25 - 49%    Lower Body Dressing (excluding footwear)   What is the patient wearing?: Incontinence brief;Pants Assist for lower body dressing: Total Assistance - Patient < 25%    Putting on/Taking off footwear   What is the patient wearing?: Non-skid slipper socks Assist for footwear: Dependent - Patient 0%       Care Tool Toileting Toileting activity Toileting Activity did not occur (Clothing management and hygiene only): N/A (no void or bm)       Care Tool Bed Mobility Roll left and right activity   Roll left and right assist level: Supervision/Verbal cueing    Sit to lying activity   Sit to lying assist level: Maximal Assistance - Patient 25 - 49%    Lying to sitting on side of bed activity   Lying to  sitting on side of bed assist level: the ability to move from lying on the back to sitting on the side of the bed with no back support.: Maximal Assistance - Patient 25 - 49%     Care Tool Transfers Sit to stand transfer   Sit to stand assist level: Maximal Assistance - Patient 25 - 49% (used stedy with mod-max A due to history of knee buckling)    Chair/bed transfer   Chair/bed transfer assist level: Maximal Assistance - Patient 25 - 49% (with stedy)     Toilet transfer   Assist Level: Maximal Assistance - Patient 24 - 49% (with stedy)     Care Tool Cognition  Expression of Ideas and Wants Expression of Ideas and Wants: 2. Frequent difficulty - frequently exhibits difficulty with expressing needs and ideas  Understanding Verbal and Non-Verbal Content Understanding Verbal and Non-Verbal Content: 2. Sometimes understands - understands only basic conversations or simple, direct phrases. Frequently requires cues to understand   Memory/Recall Ability Memory/Recall Ability : Current season;That he or she is in a hospital/hospital unit   Refer to Care Plan for [place] 1 OT Short Term Goal 1 (Week 1): Pt will demonstrate improved awareness of LUE by washing his L arm with no more than 2 cues to fully attend to it. OT Short Term Goal 2 (Week 1): Pt will demonstrate improved motor planning to don shirt with min A and mod cues or less. OT Short Term Goal 3 (Week 1): Pt will be able to stand in stedy and actively pull pants down to prep for toileting. OT Short Term Goal 4 (Week 1): Pt will be able to stand in stedy and actively pull pants up post toileting with min A. OT Short Term Goal 5 (Week 1): Pt wil self feed with supervision.  Recommendations for other services: Neuropsych   Skilled Therapeutic Intervention ADL ADL Eating: Minimal assistance  Grooming: Moderate assistance Upper Body Bathing: Maximal cueing;Supervision/safety Where Assessed-Upper Body  Bathing: Edge of bed Lower Body Bathing: Maximal cueing;Maximal assistance Where Assessed-Lower Body Bathing: Edge of bed;Other (Comment) (standing with stedy) Upper Body Dressing: Maximal cueing;Maximal assistance Where Assessed-Upper Body Dressing: Edge of bed Lower Body Dressing: Maximal cueing;Dependent Where Assessed-Lower Body Dressing: Edge of bed;Other (Comment) (standing with stedy) Toileting: Other (Comment) (simulated activity with BSC over toilet and use of stedy) Where Assessed-Toileting: Glass blower/designer: Moderate assistance (simulated activity with BSC over toilet and use of stedy) Toilet Transfer Method: Other (comment) (stedy) Toilet Transfer Equipment: Bedside commode  Pt seen for initial evaluation and ADL retraining with a focus on attention and initiation.   His wife was present and very helpful throughout the session. Pt was alert and did engage well with this therapist, participating as well as possible despite his severely delayed processing time.  No +2 available, so utilized stedy lift with pt. He needed extra processing time but was able to rise to stand several time during the session in stedy with min - mod A depending on height of seat.  Pt able to stay in perched position on seat for several minutes at a time.  Practiced using stedy to toilet with BSC over for height. Pt did well with min - mod A.  He did demonstrate impaired L side awareness and attention with use of LUE and with L lean in sitting. Reviewed with pt and spouse rehab POC, discussed OT goals and treatment and ELOS.  Pt returned to bed at end of session.  Alarm set and all needs met.    Discharge Criteria: Patient will be discharged from OT if patient refuses treatment 3 consecutive times without medical reason, if treatment goals not met, if there is a change in medical status, if patient makes no progress towards goals or if patient is discharged from hospital.  The above assessment, treatment  plan, treatment alternatives and goals were discussed and mutually agreed upon: by patient and by family  Annina Piotrowski 10/31/2021, 1:16 PM

## 2021-10-31 NOTE — Plan of Care (Signed)
  Problem: RH Swallowing Goal: LTG Patient will consume least restrictive diet using compensatory strategies with assistance (SLP) Description: LTG:  Patient will consume least restrictive diet using compensatory strategies with assistance (SLP) Flowsheets (Taken 10/31/2021 1801) LTG: Pt Patient will consume least restrictive diet using compensatory strategies with assistance of (SLP): Supervision Goal: LTG Patient will participate in dysphagia therapy to increase swallow function with assistance (SLP) Description: LTG:  Patient will participate in dysphagia therapy to increase swallow function with assistance (SLP) Flowsheets (Taken 10/31/2021 1801) LTG: Pt will participate in dysphagia therapy to increase swallow function with assistance of (SLP): Minimal Assistance - Patient > 75% Goal: LTG Pt will demonstrate functional change in swallow as evidenced by bedside/clinical objective assessment (SLP) Description: LTG: Patient will demonstrate functional change in swallow as evidenced by bedside/clinical objective assessment (SLP) Flowsheets (Taken 10/31/2021 1801) LTG: Patient will demonstrate functional change in swallow as evidenced by bedside/clinical objective assessment: Oropharyngeal swallow   Problem: RH Cognition - SLP Goal: RH LTG Patient will demonstrate orientation with cues Description:  LTG:  Patient will demonstrate orientation to person/place/time/situation with cues (SLP)   Flowsheets (Taken 10/31/2021 1801) LTG Patient will demonstrate orientation to:  Place  Person  Time  Situation LTG: Patient will demonstrate orientation using cueing (SLP): Minimal Assistance - Patient > 75%   Problem: RH Comprehension Communication Goal: LTG Patient will comprehend basic/complex auditory (SLP) Description: LTG: Patient will comprehend basic/complex auditory information with cues (SLP). Flowsheets (Taken 10/31/2021 1801) LTG: Patient will comprehend: Basic auditory information LTG:  Patient will comprehend auditory information with cueing (SLP): Minimal Assistance - Patient > 75%   Problem: RH Problem Solving Goal: LTG Patient will demonstrate problem solving for (SLP) Description: LTG:  Patient will demonstrate problem solving for basic/complex daily situations with cues  (SLP) Flowsheets (Taken 10/31/2021 1801) LTG: Patient will demonstrate problem solving for (SLP): Basic daily situations LTG Patient will demonstrate problem solving for:  Minimal Assistance - Patient > 75%  Moderate Assistance - Patient 50 - 74%   Problem: RH Attention Goal: LTG Patient will demonstrate this level of attention during functional activites (SLP) Description: LTG:  Patient will will demonstrate this level of attention during functional activites (SLP) Flowsheets (Taken 10/31/2021 1801) Patient will demonstrate during cognitive/linguistic activities the attention type of: Sustained Patient will demonstrate this level of attention during cognitive/linguistic activities in: Controlled LTG: Patient will demonstrate this level of attention during cognitive/linguistic activities with assistance of (SLP): Minimal Assistance - Patient > 75%   Problem: RH Awareness Goal: LTG: Patient will demonstrate awareness during functional activites type of (SLP) Description: LTG: Patient will demonstrate awareness during functional activites type of (SLP) Flowsheets (Taken 10/31/2021 1801) Patient will demonstrate during cognitive/linguistic activities awareness type of: Intellectual LTG: Patient will demonstrate awareness during cognitive/linguistic activities with assistance of (SLP): Minimal Assistance - Patient > 75%

## 2021-10-31 NOTE — Plan of Care (Signed)
  Problem: Consults Goal: RH GENERAL PATIENT EDUCATION Description: See Patient Education module for education specifics. Outcome: Progressing Goal: Diabetes Guidelines if Diabetic/Glucose > 140 Description: If diabetic or lab glucose is > 140 mg/dl - Initiate Diabetes/Hyperglycemia Guidelines & Document Interventions  Outcome: Progressing   Problem: RH BOWEL ELIMINATION Goal: RH STG MANAGE BOWEL WITH ASSISTANCE Description: STG Manage Bowel with mod I Assistance. Outcome: Progressing Goal: RH STG MANAGE BOWEL W/MEDICATION W/ASSISTANCE Description: STG Manage Bowel with Medication with mod I Assistance. Outcome: Progressing   Problem: RH BLADDER ELIMINATION Goal: RH STG MANAGE BLADDER WITH EQUIPMENT WITH ASSISTANCE Description: STG Manage Bladder With Equipment With toileting Assistance Outcome: Progressing   Problem: RH SKIN INTEGRITY Goal: RH STG SKIN FREE OF INFECTION/BREAKDOWN Description: Manage w min assist Outcome: Progressing Goal: RH STG MAINTAIN SKIN INTEGRITY WITH ASSISTANCE Description: STG Maintain Skin Integrity With min Assistance. Outcome: Progressing Goal: RH STG ABLE TO PERFORM INCISION/WOUND CARE W/ASSISTANCE Description: STG Able To Perform Incision/Wound Care With min Assistance. Outcome: Progressing   Problem: RH SAFETY Goal: RH STG ADHERE TO SAFETY PRECAUTIONS W/ASSISTANCE/DEVICE Description: STG Adhere to Safety Precautions With cues Assistance/Device. Outcome: Progressing   Problem: RH PAIN MANAGEMENT Goal: RH STG PAIN MANAGED AT OR BELOW PT'S PAIN GOAL Description: < 4 with prns Outcome: Progressing   Problem: RH KNOWLEDGE DEFICIT GENERAL Goal: RH STG INCREASE KNOWLEDGE OF SELF CARE AFTER HOSPITALIZATION Description: Patient and spouse will be able to manage care at discharge using handouts and educational resources independently Outcome: Progressing   Problem: Fluid Volume: Goal: Hemodynamic stability will improve Outcome: Progressing    Problem: Clinical Measurements: Goal: Diagnostic test results will improve Outcome: Progressing Goal: Signs and symptoms of infection will decrease Outcome: Progressing   Problem: Respiratory: Goal: Ability to maintain adequate ventilation will improve Outcome: Progressing

## 2021-10-31 NOTE — Progress Notes (Signed)
Inpatient Rehabilitation Care Coordinator Assessment and Plan Patient Details  Name: Troy Randall MRN: [id number] Date of Birth: [birth year]  Today's Date: 10/31/2021  Hospital Problems: Principal Problem:   Encephalomyelopathy Active Problems:   MS (multiple sclerosis) ([place])  Past Medical History:  Past Medical History:  Diagnosis Date   Anemia    Arthritis    CVA (cerebral vascular accident) ([place])    Diabetes mellitus without complication ([place])    Hyperlipidemia    Hypertension    Kidney stones    Past Surgical History:  Past Surgical History:  Procedure Laterality Date   ANKLE SURGERY     BIOPSY  09/10/2021   Procedure: BIOPSY;  Surgeon: Carol Ada, MD;  Location: [facility] ENDOSCOPY;  Service: Gastroenterology;;   BIOPSY  10/07/2021   Procedure: BIOPSY;  Surgeon: Carol Ada, MD;  Location: [facility] ENDOSCOPY;  Service: Gastroenterology;;   COLONOSCOPY N/A 10/07/2021   Procedure: COLONOSCOPY;  Surgeon: Carol Ada, MD;  Location: [place];  Service: Gastroenterology;  Laterality: N/A;   COLONOSCOPY WITH PROPOFOL N/A 09/06/2021   Procedure: COLONOSCOPY WITH PROPOFOL;  Surgeon: Harvel Quale, MD;  [facility];  Service: Gastroenterology;  Laterality: N/A;   ENTEROSCOPY N/A 09/10/2021   Procedure: ENTEROSCOPY;  Surgeon: Carol Ada, MD;  Location: [place];  Service: Gastroenterology;  Laterality: N/A;   ENTEROSCOPY  09/06/2021   Procedure: ENTEROSCOPY;  Surgeon: Harvel Quale, MD;  [facility];  Service: Gastroenterology;;   ESOPHAGOGASTRODUODENOSCOPY (EGD) WITH PROPOFOL  09/06/2021   Procedure: ESOPHAGOGASTRODUODENOSCOPY (EGD) WITH PROPOFOL;  Surgeon: Harvel Quale, MD;  [facility];  Service: Gastroenterology;;   [place] N/A 09/19/2021   Procedure: Beryle Quant;  Surgeon: Carol Ada, MD;  Location: [place];  Service: Gastroenterology;  Laterality: N/A;   FOREIGN BODY  REMOVAL  09/19/2021   Procedure: FOREIGN BODY REMOVAL;  Surgeon: Carol Ada, MD;  Location: [facility] ENDOSCOPY;  Service: Gastroenterology;;   Freda Munro CAPSULE STUDY  09/06/2021   Procedure: GIV[place] CAPSULE STUDY;  Surgeon: Harvel Quale, MD;  [facility];  Service: Gastroenterology;;   HERNIA REPAIR     umbilical x1 Incisional x1   INCISIONAL HERNIA REPAIR  04/13/2011   Procedure: LAPAROSCOPIC INCISIONAL HERNIA;  Surgeon: Jamesetta So, MD;  Location: [facility] ORS;  Service: General;  Laterality: N/A;  Recurrent Laparoscopic Incisional Herniorraphy with Mesh   IR ANGIOGRAM SELECTIVE EACH ADDITIONAL VESSEL  09/09/2021   IR ANGIOGRAM VISCERAL SELECTIVE  09/07/2021   IR ANGIOGRAM VISCERAL SELECTIVE  09/06/2021   IR ANGIOGRAM VISCERAL SELECTIVE  09/06/2021   IR EMBO ART  VEN HEMORR LYMPH EXTRAV  INC GUIDE ROADMAPPING  09/06/2021   IR EXCHANGE BILIARY DRAIN  10/24/2021   IR GUIDED DRAIN W CATHETER PLACEMENT  09/07/2021   IR US GUIDE BX ASP/DRAIN  09/07/2021   IR US GUIDE VASC ACCESS RIGHT  09/07/2021   IR US GUIDE VASC ACCESS RIGHT  09/06/2021   KIDNEY STONE SURGERY     Social History:  reports that he has never smoked. His smokeless tobacco use includes snuff. He reports that he does not drink alcohol and does not use drugs.  Family / Support Systems Spouse/Significant Other: Angela Anticipated Caregiver: spouse, Levada Dy Ability/Limitations of Caregiver: MIN A Caregiver Availability: 24/7 Family Dynamics: support from spouse  Social History Preferred language: English Religion: [employer] - How often do you need to have someone help you when you read instructions, pamphlets, or other written material from your doctor or pharmacy?:  Never   Abuse/Neglect Abuse/Neglect Assessment Can Be Completed: Yes Physical Abuse: Denies Verbal Abuse: Denies Sexual Abuse: Denies Exploitation of patient/patient's resources: Denies Self-Neglect: Denies  Patient response to: Social Isolation -  How often do you feel lonely or isolated from those around you?: Never  Emotional Status Recent Psychosocial Issues: coping Psychiatric History: n/a Substance Abuse History: n/a  Patient / Family Perceptions, Expectations & Goals Pt/Family understanding of illness & functional limitations: yes Premorbid pt/family roles/activities: Previously independent overall Anticipated changes in roles/activities/participation: Spouse anticipates assisting 24/7 up to Min A Pt/family expectations/goals: [place]: Other (Comment) Premorbid Home Care/DME Agencies: Other (Comment) Administrator, Civil Service) Transportation available at discharge: spouse able to transport Is the patient able to respond to transportation needs?: Yes In the past 12 months, has lack of transportation kept you from medical appointments or from getting medications?: No In the past 12 months, has lack of transportation kept you from meetings, work, or from getting things needed for daily living?: No Resource referrals recommended: Neuropsychology  Discharge Planning Living Arrangements: Spouse/significant other Support Systems: Spouse/significant other, Children Type of Residence: Private residence (2 level home, 1 step) Insurance Resources: Multimedia programmer (specify) Doctor, general practice) Museum/gallery curator Resources: Secondary school teacher Screen Referred: No Living Expenses: Lives with family Does the patient have any problems obtaining your medications?: No Home Management: Independent Patient/Family Preliminary Plans: Spouse able to assis tif needed Care Coordinator Barriers to Discharge: Lack of/limited family support, Insurance for SNF coverage, Decreased caregiver support, Inaccessible home environment, Home environment access/layout Care Coordinator Anticipated Follow Up Needs: HH/OP Expected length of stay: 13-16 days  Clinical Impression Sw met with patient and spouse and introduced self,  explained role and addressed questions and concerns. Patient plans to discharge home with 24/7 assistance from spouse able to assist up to MIN A. No additional questions or concerns, sw will continue to follow up.  Dyanne Iha 10/31/2021, 12:26 PM

## 2021-10-31 NOTE — H&P (Signed)
PROGRESS NOTE   Subjective/Complaints:  Multiple type 6 and 7 stools in last 24h, wife says it starte on acute care, no abd pain .  Pt remains confused   ROS- limited by mental status   Objective:   No results found. Recent Labs    10/30/21 0312 10/31/21 0542  WBC 7.0 12.7*  HGB 9.6* 10.0*  HCT 30.4* 33.2*  PLT 254 272   Recent Labs    10/30/21 0312 10/31/21 0542  NA 140 141  K 4.0 4.0  CL 106 106  CO2 26 27  GLUCOSE 174* 165*  BUN 10 7  CREATININE 0.68 0.73  CALCIUM 8.8* 9.0    Intake/Output Summary (Last 24 hours) at 10/31/2021 0753 Last data filed at 10/30/2021 1821 Gross per 24 hour  Intake 236 ml  Output --  Net 236 ml     Pressure Injury 10/06/21 Sacrum Medial Stage 2 -  Partial thickness loss of dermis presenting as a shallow open injury with a red, pink wound bed without slough. (Active)  10/06/21 2000  Location: Sacrum  Location Orientation: Medial  Staging: Stage 2 -  Partial thickness loss of dermis presenting as a shallow open injury with a red, pink wound bed without slough.  Wound Description (Comments):   Present on Admission: Yes    Physical Exam: Vital Signs Blood pressure 135/88, pulse 94, temperature 98.4 F (36.9 C), resp. rate 18, height 6' (1.829 m), weight 120.3 kg, SpO2 98 %.   General: No acute distress Mood and affect are appropriate Heart: Regular rate and rhythm no rubs murmurs or extra sounds Lungs: Clear to auscultation, breathing unlabored, no rales or wheezes Abdomen: Positive bowel sounds, soft nontender to palpation, nondistended Extremities: No clubbing, cyanosis, or edema Skin: No evidence of breakdown, no evidence of rash Neurologic: Cranial nerves II through XII intact, pt has difficulty following commands motor strength is 4/5 in bilateral deltoid, bicep, tricep, grip, hip flexor, knee extensors, ankle dorsiflexor and plantar flexor Sensory exam normal  sensation to light touch and proprioception in bilateral upper and lower extremities Cerebellar exam normal finger to nose to finger as well as heel to shin in bilateral upper and lower extremities Musculoskeletal: Full range of motion in all 4 extremities. No joint swelling   Assessment/Plan: 1. Functional deficits which require 3+ hours per day of interdisciplinary therapy in a comprehensive inpatient rehab setting. Physiatrist is providing close team supervision and 24 hour management of active medical problems listed below. Physiatrist and rehab team continue to assess barriers to discharge/monitor patient progress toward functional and medical goals  Care Tool:  Bathing              Bathing assist       Upper Body Dressing/Undressing Upper body dressing        Upper body assist      Lower Body Dressing/Undressing Lower body dressing            Lower body assist       Toileting Toileting    Toileting assist       Transfers Chair/bed transfer  Transfers assist           Locomotion  Ambulation   Ambulation assist              Walk 10 feet activity   Assist           Walk 50 feet activity   Assist           Walk 150 feet activity   Assist           Walk 10 feet on uneven surface  activity   Assist           Wheelchair     Assist               Wheelchair 50 feet with 2 turns activity    Assist            Wheelchair 150 feet activity     Assist          Blood pressure 135/88, pulse 94, temperature 98.4 F (36.9 C), resp. rate 18, height 6' (1.829 m), weight 120.3 kg, SpO2 98 %.  Medical Problem List and Plan: 1. 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 acute respiratory failure, and cholecystitis.             -patient may  shower- cover  drain             -ELOS/Goals: 2.5-3.5 weeks 2.  Antithrombotics: -DVT/anticoagulation:  Pharmaceutical: Lovenox             -antiplatelet therapy:Hx of CVA x 2 home Plavix and aspirin on hold due to GI bleed 3. Pain Management: Tylenol, Lidoderm patch as needed 4. Mood/Behavior/Sleep: LCSW to evaluate and provide emotional support             -antipsychotic agents: n/a 5. Neuropsych/cognition: This patient is not capable of making decisions on his own behalf. 6. Skin/Wound Care: Routine skin checks             -sacral fissure with likely Candidiasis. Apply Nystatin powder.             -continue butt cream to anal area- skin without breakdown in sacro coccygeal area 7. Fluids/Electrolytes/Nutrition: Routine Is and Os and follow-up chemistries 8: Cholecystis s/p cholecystostomy tube; exchanged on 10/24/21             -surgical follow-up at discharge 9: DM: carb mod diet; CBGs q AC and q HS             -continue Semglee 15 units BID             -continue SSI q AC and q HS             -home Glucotrol XL 10 mg and Tresiba 20 units BID not restarted CBG (last 3)  Recent Labs    10/30/21 1630 10/30/21 2105 10/31/21 0553  GLUCAP 210* 267* 165*  Will monitor prior to changes   10: Hypertension: monitor; home amlodipine 10 mg and lisinopril 40 mg not restarted             -on doxazosin 2 mg daily for urinary retention 11: Urinary retention with Foley re-inserted 10/10             -continue doxazosin             -? restart home Flomax 12: Hyperlipidemia: continue Crestor 13: GIB/GI prophylaxis: s/p 19-20 units pRBCs- continue Protonix             -  monitor H and H             -follow-up with GI as outpatient 14: Chronic colitis by biopsy; unknown etiology- given increased freq of stools check C diff              -follow-up with GI as outpatient 15: Acute respiratory failure s/p trach; decannulated 10/19 16. Probable MS-s/p high dose steroids and Plasma exchange-  17. Encephalopathy-  impaired cognition- monitor and might benefit from meds/intervention 18. Low back pain and B/L shoulder pain- will change lidoderm patches to 3 8pm to 8am and con't tylenol- Goes to [facility] for injections- avoid opiates due to sedation/encephalopathy 19. Impaired intake/Dysphagia- on D3 nectart thick liquids- encouraged to drink 6-8 cups/day- I.e more nectar thick liquids.       LOS: 1 days A FACE TO FACE EVALUATION WAS PERFORMED  Erick Colace 10/31/2021, 7:53 AM

## 2021-10-31 NOTE — Progress Notes (Signed)
[facility] Individual Statement of Services  Patient Name:  Troy Randall  Date:  10/31/2021  Welcome to the [facility].  Our goal is to provide you with an individualized program based on your diagnosis and situation, designed to meet your specific needs.  With this comprehensive rehabilitation program, you will be expected to participate in at least 3 hours of rehabilitation therapies Monday-Friday, with modified therapy programming on the weekends.  Your rehabilitation program will include the following services:  Physical Therapy (PT), Occupational Therapy (OT), Speech Therapy (ST), 24 hour per day rehabilitation nursing, Therapeutic Recreaction (TR), Neuropsychology, Care Coordinator, Rehabilitation Medicine, Nutrition Services, Pharmacy Services, and Other  Weekly team conferences will be held on Wednesdays to discuss your progress.  Your Inpatient Rehabilitation Care Coordinator will talk with you frequently to get your input and to update you on team discussions.  Team conferences with you and your family in attendance may also be held.  Expected length of stay:  13-16 Days  Overall anticipated outcome:  Supervision  Depending on your progress and recovery, your program may change. Your Inpatient Rehabilitation Care Coordinator will coordinate services and will keep you informed of any changes. Your Inpatient Rehabilitation Care Coordinator's name and contact numbers are listed  below.  The following services may also be recommended but are not provided by the [facility]:   [place] will be made to provide these services after discharge if needed.  Arrangements include referral to agencies that provide these services.  Your insurance has been verified to be:   [employer] Your primary doctor is:  Allyn Kenner, MD  Pertinent information will  be shared with your doctor and your insurance company.  Inpatient Rehabilitation Care Coordinator:  Erlene Quan, [place] or [phone]  Information discussed with and copy given to patient by: Dyanne Iha, 10/31/2021, 11:05 AM

## 2021-10-31 NOTE — Discharge Summary (Signed)
Physician Discharge Summary  Patient ID: Troy Randall MRN: [id number] DOB/AGE: 1960/04/25 61 y.o.  Admit date: 10/30/2021 Discharge date: 11/06/2021  Discharge Diagnoses:  Principal Problem:   Encephalomyelopathy Active Problems:   MS (multiple sclerosis) ([facility])   SVT (supraventricular tachycardia) Functional deficits secondary to acute disseminated encephalomyelitis, demyelinating disease in the brain, C-spine and T-spine debility GI bleed Acute respiratory failure Cholecystitis Diabetes mellitus Hypertension Urinary retention Hyperlipidemia Chronic colitis Low back pain Bilateral shoulder pain Impaired p.o. intake  Discharged Condition: serious  Significant Diagnostic Studies:  Labs:  Basic Metabolic Panel: Recent Labs  Lab 11/03/21 0511 11/06/21 2032 11/07/21 0039  NA 141 140 137  K 3.8 3.9 3.4*  CL 106 102 104  CO2 25 23 24   GLUCOSE 141* 250* 206*  BUN 7 14 13   CREATININE 0.70 1.15 1.03  CALCIUM 8.6* 9.2 8.6*  MG  --  1.5* 2.1    CBC: Recent Labs  Lab 11/03/21 0511 11/06/21 2032 11/07/21 0039  WBC 6.6 14.0* 16.4*  NEUTROABS  --  13.3* 15.4*  HGB 9.5* 9.8* 9.7*  HCT 29.9* 31.5* 30.9*  MCV 88.2 88.5 89.3  PLT 227 215 201    CBG: Recent Labs  Lab 11/06/21 1659 11/06/21 1807 11/06/21 2050 11/06/21 2319 11/07/21 0637  GLUCAP 199* 178* 235* 202* 145*    Brief HPI:   Troy Randall is a 61 y.o. male who presented to the [facility] emergency department on 09/04/2021 reported hematochezia and abdominal cramping.  Gastroenterology was consulted he was admitted.  He underwent EGD with deployment of endoscopic capsule and colonoscopy.  Interventional radiology was consulted and he underwent coil of the jejunal branch of the SMA.  He required approximately 20 units of packed red blood cells.  Medical history is also significant for prior CVA x2 and maintained on Plavix and aspirin.  His hospitalization was complicated by cholecystitis and he underwent  percutaneous cholecystostomy tube placement, acute renal failure requiring CRRT and respiratory failure with subsequent tracheostomy on 9/11.  Due to persistent confusion, he underwent CT head and MRI of the brain with findings of multiple interval white matter insults versus inflammatory process.  Neurological consultation was obtained.  He underwent laboratory work-up and lumbar puncture.  He was treated with IV steroids and then underwent full course of plasma exchange completed on 10/11.  He has been unable to void spontaneously and Foley catheter is in place.  Plavix and aspirin continued to be held.   Hospital Course: Troy Randall was admitted to rehab 10/30/2021 for inpatient therapies to consist of PT, ST and OT at least three hours five days a week. Past admission physiatrist, therapy team and rehab RN have worked together to provide customized collaborative inpatient rehab.  Noted to have increased frequency of stools and C. difficile toxin checked.  Protonix increased to twice daily dosing.  C. difficile toxin negative.  Follow-up labs stable on 10/30.  Patient was ambulating 95 feet with physical therapy and due to history of GI bleed, Lovenox was discontinued and SCDs applied.  Flomax discontinued and doxazosin continued for urinary retention. Complained of low back pain and bilateral shoulder pain and Lidoderm patches were changed to 3 patches from 8 PM to 8 AM and to continue Tylenol.  Continued on dysphagia 3 diet with nectar thick liquids. Voiding trial on 11/2. At approximately 6:30 pm, RN noted rigors and rectal temp of 100. Sepsis work-up initiated. Recheck of vitals signs with heart rate in 160s. Rapid response called. Transferred  to acute care/[name] service.  Rehab course: During patient's stay in rehab weekly team conferences were held to monitor patient's progress, set goals and discuss barriers to discharge. At admission, patient required max to total assist with basic self care skills  and max assist with mobility.   Disposition:  Discharge disposition: 02-Transferred to [facility]       Allergies as of 11/06/2021       Reactions   Metformin And Related    GI upset   Penicillins Hives        Medication List     ASK your doctor about these medications    BD Veo Insulin Syringe U/F 31G X 15/64" 1 ML Misc Generic drug: Insulin Syringe-Needle U-100 USE AS DIRECTED   doxazosin 2 MG tablet Commonly known as: CARDURA Take 1 tablet (2 mg total) by mouth daily.   glipiZIDE 10 MG 24 hr tablet Commonly known as: Glucotrol XL Take 1 tablet (10 mg total) by mouth daily with breakfast.   glucose blood test strip Commonly known as: OneTouch Verio TEST twice a day   Insulin Pen Needle 32G X 5 MM Misc Commonly known as: NovoTwist Use two daily to inject Victoza and Toujeo.   levocetirizine 5 MG tablet Commonly known as: XYZAL Take 1 tablet by mouth at bedtime.   metFORMIN 500 MG 24 hr tablet Commonly known as: GLUCOPHAGE-XR Take 500-1,000 mg by mouth in the morning and at bedtime.   OneTouch Delica Lancets 33G Misc Use to check blood sugar once a day dx code E11.65   pantoprazole 40 MG tablet Commonly known as: Protonix Take 1 tablet (40 mg total) by mouth 2 (two) times daily before a meal.   rosuvastatin 40 MG tablet Commonly known as: Crestor Take 1 tablet (40 mg total) by mouth daily.   [name] FlexTouch 200 UNIT/ML FlexTouch Pen Generic drug: insulin degludec Inject 20 Units into the skin in the morning and at bedtime.        Follow-up Information     Benita Stabile, MD Follow up.   Specialty: Internal Medicine Why: Call in 1-2 days for hospital follow-up appointment Contact information: [address] [place] [facility] [address] [phone]         Kirsteins, Victorino Sparrow, MD Follow up.   Specialty: Physical Medicine and Rehabilitation Contact information: [address] [place] [address] [place]  [address] [phone]         Asa Lente, MD Follow up.   Specialty: Neurology Why: Call in 1-2 days for hospital follow-up appointment Contact information: [address] [place] [address] [phone]                 Signed: Milinda Antis 11/07/2021, 8:45 AM

## 2021-10-31 NOTE — Evaluation (Signed)
Physical Therapy Assessment and Plan  Patient Details  Name: Troy Randall MRN: [id number] Date of Birth: [birth year]  PT Diagnosis: {diagnoses:3041673} Rehab Potential:   ELOS:     {CHL IP REHAB PT TIME CALCULATION:304800500}   Hospital Problem: Principal Problem:   Encephalomyelopathy Active Problems:   MS (multiple sclerosis) ([place])   Past Medical History:  Past Medical History:  Diagnosis Date   Anemia    Arthritis    CVA (cerebral vascular accident) ([place])    Diabetes mellitus without complication ([place])    Hyperlipidemia    Hypertension    Kidney stones    Past Surgical History:  Past Surgical History:  Procedure Laterality Date   ANKLE SURGERY     BIOPSY  09/10/2021   Procedure: BIOPSY;  Surgeon: Carol Ada, MD;  Location: [facility] ENDOSCOPY;  Service: Gastroenterology;;   BIOPSY  10/07/2021   Procedure: BIOPSY;  Surgeon: Carol Ada, MD;  Location: [place];  Service: Gastroenterology;;   COLONOSCOPY N/A 10/07/2021   Procedure: COLONOSCOPY;  Surgeon: Carol Ada, MD;  Location: [place];  Service: Gastroenterology;  Laterality: N/A;   COLONOSCOPY WITH PROPOFOL N/A 09/06/2021   Procedure: COLONOSCOPY WITH PROPOFOL;  Surgeon: Harvel Quale, MD;  [facility];  Service: Gastroenterology;  Laterality: N/A;   ENTEROSCOPY N/A 09/10/2021   Procedure: ENTEROSCOPY;  Surgeon: Carol Ada, MD;  Location: [place];  Service: Gastroenterology;  Laterality: N/A;   ENTEROSCOPY  09/06/2021   Procedure: ENTEROSCOPY;  Surgeon: Harvel Quale, MD;  [facility];  Service: Gastroenterology;;   ESOPHAGOGASTRODUODENOSCOPY (EGD) WITH PROPOFOL  09/06/2021   Procedure: ESOPHAGOGASTRODUODENOSCOPY (EGD) WITH PROPOFOL;  Surgeon: Harvel Quale, MD;  [facility];  Service: Gastroenterology;;   [place] N/A 09/19/2021   Procedure: Beryle Quant;  Surgeon: Carol Ada, MD;  Location: [place];   Service: Gastroenterology;  Laterality: N/A;   FOREIGN BODY REMOVAL  09/19/2021   Procedure: FOREIGN BODY REMOVAL;  Surgeon: Carol Ada, MD;  Location: [place];  Service: Gastroenterology;;   Freda Munro CAPSULE STUDY  09/06/2021   Procedure: GIVENS CAPSULE STUDY;  Surgeon: Harvel Quale, MD;  [facility];  Service: Gastroenterology;;   HERNIA REPAIR     umbilical x1 Incisional x1   INCISIONAL HERNIA REPAIR  04/13/2011   Procedure: LAPAROSCOPIC INCISIONAL HERNIA;  Surgeon: Jamesetta So, MD;  Location: [facility];  Service: General;  Laterality: N/A;  Recurrent Laparoscopic Incisional Herniorraphy with Mesh   IR ANGIOGRAM SELECTIVE EACH ADDITIONAL VESSEL  09/09/2021   IR ANGIOGRAM VISCERAL SELECTIVE  09/07/2021   IR ANGIOGRAM VISCERAL SELECTIVE  09/06/2021   IR ANGIOGRAM VISCERAL SELECTIVE  09/06/2021   IR EMBO ART  VEN HEMORR LYMPH EXTRAV  INC GUIDE ROADMAPPING  09/06/2021   IR EXCHANGE BILIARY DRAIN  10/24/2021   IR GUIDED DRAIN W CATHETER PLACEMENT  09/07/2021   IR US GUIDE BX ASP/DRAIN  09/07/2021   IR US GUIDE VASC ACCESS RIGHT  09/07/2021   IR US GUIDE VASC ACCESS RIGHT  09/06/2021   KIDNEY STONE SURGERY      Assessment & Plan Clinical Impression: Patient is a 61 year old R handed  male who presented to [facility] emergency department on 09/04/2021 reporting hematochezia associated with diarrhea and abdominal cramping.  He has a past medical history of CVA x2 ( had fully recovered from both- latest 4/23) maintained on Plavix and aspirin.  He had had a normal colonoscopy in February 2023.  Gastroenterology was consulted.  He underwent EGD  with deployment of endoscopic capsule and colonoscopy on 9/2.  Interventional radiology was consulted and he underwent coil of the jejunal branch of the SMA on 9/2. Active bleeding noted in the proximal jejunum which was tattooed and hemostatic clip placed.  He underwent flexible sigmoidoscopy on 9/15 to retrieve endoscopic capsule and colonoscopy was  performed on 10/3 with findings of large superficial ulceration of the sigmoid colon.  This was biopsied and consistent with chronic colitis.          He required approximately 20 units of packed red blood cells.  His hemoglobin is stable and improving. His hospitalization was complicated by cholecystitis and he underwent percutaneous cholecystostomy tube placement, acute renal failure requiring CRRT and respiratory failure with subsequent tracheostomy 9/11.  LFTs and renal function are normal. CT head performed secondary to acute encephalopathy and history of prior CVA.  MRI of the brain performed secondary to multiple interval white matter insults versus inflammatory process.  Neurology consultation obtained on 9/17.  The patient underwent laboratory work-up including lumbar puncture.  He was treated with IV steroids with mild improvement.  He then underwent full course of plasma exchange completed on 10/11.  MRI completed of the cervical and thoracic spine.  He will follow-up with neurology as outpatient.   His Foley catheter was removed on 10/9 however he was unable to void spontaneously and his catheter was reinserted 10/10.  He continues on doxazosin.  He has a history of diabetes mellitus and diabetic coordinator consulted.  Home diabetic medications not yet restarted.  Has findings of stage II medial sacral ulcer.  Renal function has normalized.  He is tolerating dysphagia 3 diet with nectar thick liquids.The patient requires inpatient physical medicine and rehabilitation evaluations and treatment secondary to dysfunction due to encephalopathy, prolonged hospitalization secondary to GI bleed, respiratory failure and kidney failure.  Patient transferred to CIR on 10/30/2021 .   Patient currently requires {LXB:2620355} with mobility secondary to {impairments:3041632}.  Prior to hospitalization, patient was {HRC:1638453} with mobility and lived with Spouse in a House home.  Home access is 1Stairs to  enter.  Patient will benefit from skilled PT intervention to {benefits:22816} for planned discharge {planned discharge:3041670}.  Anticipate patient will {follow MI:6803212} at discharge.      PT Evaluation Precautions/Restrictions Precautions Precautions: Fall Precaution Comments: R drain Restrictions Weight Bearing Restrictions: No General   Vital Signs  Pain Pain Assessment Pain Score: 0-No pain Pain Interference   Home Living/Prior Functioning Home Living Living Arrangements: Spouse/significant other Available Help at Discharge: Family;Available PRN/intermittently Type of Home: House Home Access: Stairs to enter [employer] of Steps: 1 Entrance Stairs-Rails: None Home Layout: Two level;Laundry or work area in basement Alternate [employer] of Steps: chair lift down to basement; pt and wife live in his father's old house that was set up for the father. Have grab bars in the bathroom and built in shower seat. Bathroom Shower/Tub: Gaffer  Lives With: Spouse Vision/Perception  Vision - History Ability to See in Adequate Light: 2 Moderately impaired (unable to read the clock) Vision - Assessment Additional Comments: pt unable to follow directions for visual assessment but he was adequately tracking and visually attending to this therapist and his spouse Perception Perception: Impaired Inattention/Neglect: Does not attend to left side of body;Does not attend to left visual field Praxis Praxis: Impaired Praxis Impairment Details: Motor planning Praxis-Other Comments: max cues with donning clothing  Cognition Overall Cognitive Status: Impaired/Different from baseline Arousal/Alertness: Awake/alert Memory: Impaired Memory Impairment: Storage  deficit;Decreased recall of new information;Decreased short term memory Awareness: Impaired Awareness Impairment: Intellectual impairment Problem Solving: Impaired Problem Solving Impairment: Verbal  basic;Functional basic Safety/Judgment: Impaired Comments: pt very cooperative in OT session Sensation   Motor      Trunk/Postural Assessment     Balance   Extremity Assessment           Care Tool Care Tool Bed Mobility Roll left and right activity   Roll left and right assist level: Supervision/Verbal cueing    Sit to lying activity   Sit to lying assist level: Maximal Assistance - Patient 25 - 49%    Lying to sitting on side of bed activity   Lying to sitting on side of bed assist level: the ability to move from lying on the back to sitting on the side of the bed with no back support.: Maximal Assistance - Patient 25 - 49%     Care Tool Transfers Sit to stand transfer   Sit to stand assist level: Maximal Assistance - Patient 25 - 49% (used stedy with mod-max A due to history of knee buckling)    Chair/bed transfer   Chair/bed transfer assist level: Maximal Assistance - Patient 25 - 49% (with stedy)     Toilet transfer   Assist Level: Maximal Assistance - Patient 24 - 49% (with stedy)    Car transfer          Care Tool Locomotion Ambulation          Walk 10 feet activity         Walk 50 feet with 2 turns activity        Walk 150 feet activity        Walk 10 feet on uneven surfaces activity        Stairs          Walk up/down 1 step activity        Walk up/down 4 steps activity        Walk up/down 12 steps activity        Pick up small objects from floor        Wheelchair            Wheel 50 feet with 2 turns activity      Wheel 150 feet activity        Refer to Care Plan for [place] 1    Recommendations for other services: {RECOMMENDATIONS FOR OTHER SERVICES:3049016}  Skilled Therapeutic Intervention Mobility   Locomotion      Discharge Criteria: Patient will be discharged from PT if patient refuses treatment 3 consecutive times without medical reason, if treatment goals not met, if  there is a change in medical status, if patient makes no progress towards goals or if patient is discharged from hospital.  The above assessment, treatment plan, treatment alternatives and goals were discussed and mutually agreed upon: {Assessment/Treatment Plan Discussed/Agreed:3049017}  Lorie Phenix 10/31/2021, 1:06 PM

## 2021-10-31 NOTE — Plan of Care (Signed)
Problem: RH Balance Goal: LTG: Patient will maintain dynamic sitting balance (OT) Description: LTG:  Patient will maintain dynamic sitting balance with assistance during activities of daily living (OT) Flowsheets (Taken 10/31/2021 1324) LTG: Pt will maintain dynamic sitting balance during ADLs with: Supervision/Verbal cueing Goal: LTG Patient will maintain dynamic standing with ADLs (OT) Description: LTG:  Patient will maintain dynamic standing balance with assist during activities of daily living (OT)  Flowsheets (Taken 10/31/2021 1324) LTG: Pt will maintain dynamic standing balance during ADLs with: Contact Guard/Touching assist   Problem: Sit to Stand Goal: LTG:  Patient will perform sit to stand in prep for activites of daily living with assistance level (OT) Description: LTG:  Patient will perform sit to stand in prep for activites of daily living with assistance level (OT) Flowsheets (Taken 10/31/2021 1324) LTG: PT will perform sit to stand in prep for activites of daily living with assistance level: Supervision/Verbal cueing   Problem: RH Eating Goal: LTG Patient will perform eating w/assist, cues/equip (OT) Description: LTG: Patient will perform eating with assist, with/without cues using equipment (OT) Flowsheets (Taken 10/31/2021 1324) LTG: Pt will perform eating with assistance level of: Independent   Problem: RH Grooming Goal: LTG Patient will perform grooming w/assist,cues/equip (OT) Description: LTG: Patient will perform grooming with assist, with/without cues using equipment (OT) Flowsheets (Taken 10/31/2021 1324) LTG: Pt will perform grooming with assistance level of: Set up assist    Problem: RH Bathing Goal: LTG Patient will bathe all body parts with assist levels (OT) Description: LTG: Patient will bathe all body parts with assist levels (OT) Flowsheets (Taken 10/31/2021 1324) LTG: Pt will perform bathing with assistance level/cueing: Supervision/Verbal cueing    Problem: RH Dressing Goal: LTG Patient will perform upper body dressing (OT) Description: LTG Patient will perform upper body dressing with assist, with/without cues (OT). Flowsheets (Taken 10/31/2021 1324) LTG: Pt will perform upper body dressing with assistance level of: Supervision/Verbal cueing Goal: LTG Patient will perform lower body dressing w/assist (OT) Description: LTG: Patient will perform lower body dressing with assist, with/without cues in positioning using equipment (OT) Flowsheets (Taken 10/31/2021 1324) LTG: Pt will perform lower body dressing with assistance level of: Minimal Assistance - Patient > 75%   Problem: RH Toileting Goal: LTG Patient will perform toileting task (3/3 steps) with assistance level (OT) Description: LTG: Patient will perform toileting task (3/3 steps) with assistance level (OT)  Flowsheets (Taken 10/31/2021 1324) LTG: Pt will perform toileting task (3/3 steps) with assistance level: Minimal Assistance - Patient > 75%   Problem: RH Functional Use of Upper Extremity Goal: LTG Patient will use RT/LT upper extremity as a (OT) Description: LTG: Patient will use right/left upper extremity as a stabilizer/gross assist/diminished/nondominant/dominant level with assist, with/without cues during functional activity (OT) Flowsheets (Taken 10/31/2021 1324) LTG: Use of upper extremity in functional activities: LUE as nondominant level LTG: Pt will use upper extremity in functional activity with assistance level of: Supervision/Verbal cueing   Problem: RH Toilet Transfers Goal: LTG Patient will perform toilet transfers w/assist (OT) Description: LTG: Patient will perform toilet transfers with assist, with/without cues using equipment (OT) Flowsheets (Taken 10/31/2021 1324) LTG: Pt will perform toilet transfers with assistance level of: Contact Guard/Touching assist   Problem: RH Tub/Shower Transfers Goal: LTG Patient will perform tub/shower transfers  w/assist (OT) Description: LTG: Patient will perform tub/shower transfers with assist, with/without cues using equipment (OT) Flowsheets (Taken 10/31/2021 1324) LTG: Pt will perform tub/shower stall transfers with assistance level of: Minimal Assistance - Patient >  75% LTG: Pt will perform tub/shower transfers from: Walk in shower   Problem: RH Memory Goal: LTG Patient will demonstrate ability for day to day recall/carry over during activities of daily living with assistance level (OT) Description: LTG:  Patient will demonstrate ability for day to day recall/carry over during activities of daily living with assistance level (OT). Flowsheets (Taken 10/31/2021 1324) LTG:  Patient will demonstrate ability for day to day recall/carry over during activities of daily living with assistance level (OT): Supervision   Problem: RH Attention Goal: LTG Patient will demonstrate this level of attention during functional activites (OT) Description: LTG:  Patient will demonstrate this level of attention during functional activites  (OT) Flowsheets (Taken 10/31/2021 1324) Patient will demonstrate this level of attention during functional activites: Sustained LTG: Patient will demonstrate this level of attention during functional activites (OT): Supervision   Problem: RH Awareness Goal: LTG: Patient will demonstrate awareness during functional activites type of (OT) Description: LTG: Patient will demonstrate awareness during functional activites type of (OT) Flowsheets (Taken 10/31/2021 1324) Patient will demonstrate awareness during functional activites type of: Emergent LTG: Patient will demonstrate awareness during functional activites type of (OT): Supervision

## 2021-11-01 LAB — CBC
HCT: 33.6 % — ABNORMAL LOW (ref 39.0–52.0)
Hemoglobin: 10.6 g/dL — ABNORMAL LOW (ref 13.0–17.0)
MCH: 28.2 pg (ref 26.0–34.0)
MCHC: 31.5 g/dL (ref 30.0–36.0)
MCV: 89.4 fL (ref 80.0–100.0)
Platelets: 230 10*3/uL (ref 150–400)
RBC: 3.76 MIL/uL — ABNORMAL LOW (ref 4.22–5.81)
RDW: 14.9 % (ref 11.5–15.5)
WBC: 9.3 10*3/uL (ref 4.0–10.5)
nRBC: 0 % (ref 0.0–0.2)

## 2021-11-01 LAB — GLUCOSE, CAPILLARY
Glucose-Capillary: 133 mg/dL — ABNORMAL HIGH (ref 70–99)
Glucose-Capillary: 160 mg/dL — ABNORMAL HIGH (ref 70–99)
Glucose-Capillary: 195 mg/dL — ABNORMAL HIGH (ref 70–99)
Glucose-Capillary: 215 mg/dL — ABNORMAL HIGH (ref 70–99)

## 2021-11-01 MED ORDER — PANTOPRAZOLE 2 MG/ML SUSPENSION
40.0000 mg | Freq: Two times a day (BID) | ORAL | Status: DC
  Administered 2021-11-01 – 2021-11-04 (×8): 40 mg via ORAL
  Filled 2021-11-01 (×9): qty 20

## 2021-11-01 NOTE — Progress Notes (Addendum)
PROGRESS NOTE   Subjective/Complaints:  Had red stool per CNA this am but nurse checked and did not see any blood.  No abd pain no N/V C diff toxin neg  ROS- neg CP, SOB, N/V/D Objective:   No results found. Recent Labs    10/30/21 0312 10/31/21 0542  WBC 7.0 12.7*  HGB 9.6* 10.0*  HCT 30.4* 33.2*  PLT 254 272   Recent Labs    10/30/21 0312 10/31/21 0542  NA 140 141  K 4.0 4.0  CL 106 106  CO2 26 27  GLUCOSE 174* 165*  BUN 10 7  CREATININE 0.68 0.73  CALCIUM 8.8* 9.0    Intake/Output Summary (Last 24 hours) at 11/01/2021 0742 Last data filed at 10/31/2021 1834 Gross per 24 hour  Intake 218 ml  Output 20 ml  Net 198 ml     Pressure Injury 10/06/21 Sacrum Medial Stage 2 -  Partial thickness loss of dermis presenting as a shallow open injury with a red, pink wound bed without slough. (Active)  10/06/21 2000  Location: Sacrum  Location Orientation: Medial  Staging: Stage 2 -  Partial thickness loss of dermis presenting as a shallow open injury with a red, pink wound bed without slough.  Wound Description (Comments):   Present on Admission: Yes    Physical Exam: Vital Signs Blood pressure 134/72, pulse (!) 105, temperature 98.5 F (36.9 C), temperature source Oral, resp. rate 18, height 6' (1.829 m), weight 120.3 kg, SpO2 97 %.   General: No acute distress Mood and affect are appropriate Heart: Regular rate and rhythm no rubs murmurs or extra sounds Lungs: Clear to auscultation, breathing unlabored, no rales or wheezes Abdomen: Positive bowel sounds, soft nontender to palpation, nondistended Anus- some dried brown liquid stool.No blood  Extremities: No clubbing, cyanosis, or edema Skin: No evidence of breakdown, no evidence of rash, has sacral foam protector no redness Neurologic: Cranial nerves II through XII intact, motor strength is 4/5 in bilateral deltoid, bicep, tricep, grip, hip flexor, knee  extensors, ankle dorsiflexor and plantar flexor Sensory exam normal sensation to light touch and proprioception in bilateral upper and lower extremities Cerebellar exam normal finger to nose to finger as well as heel to shin in bilateral upper and lower extremities Musculoskeletal: Full range of motion in all 4 extremities. No joint swelling   Assessment/Plan: 1. Functional deficits which require 3+ hours per day of interdisciplinary therapy in a comprehensive inpatient rehab setting. Physiatrist is providing close team supervision and 24 hour management of active medical problems listed below. Physiatrist and rehab team continue to assess barriers to discharge/monitor patient progress toward functional and medical goals  Care Tool:  Bathing    Body parts bathed by patient: Chest, Abdomen, Face, Left arm, Right arm   Body parts bathed by helper: Front perineal area, Buttocks, Right upper leg, Left upper leg, Right lower leg, Left lower leg     Bathing assist Assist Level: Maximal Assistance - Patient 24 - 49% (max cues)     Upper Body Dressing/Undressing Upper body dressing   What is the patient wearing?: Pull over shirt    Upper body assist Assist Level: Maximal Assistance -  Patient 25 - 49%    Lower Body Dressing/Undressing Lower body dressing      What is the patient wearing?: Incontinence brief, Pants     Lower body assist Assist for lower body dressing: Total Assistance - Patient < 25%     Toileting Toileting Toileting Activity did not occur Landscape architect and hygiene only): N/A (no void or bm)  Toileting assist       Transfers Chair/bed transfer  Transfers assist     Chair/bed transfer assist level: Maximal Assistance - Patient 25 - 49% (with stedy)     Locomotion Ambulation   Ambulation assist              Walk 10 feet activity   Assist           Walk 50 feet activity   Assist           Walk 150 feet activity   Assist            Walk 10 feet on uneven surface  activity   Assist           Wheelchair     Assist               Wheelchair 50 feet with 2 turns activity    Assist            Wheelchair 150 feet activity     Assist          Blood pressure 134/72, pulse (!) 105, temperature 98.5 F (36.9 C), temperature source Oral, resp. rate 18, height 6' (1.829 m), weight 120.3 kg, SpO2 97 %.  Medical Problem List and Plan: 1. 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 acute respiratory failure, and cholecystitis.             -patient may  shower- cover drain             -ELOS/Goals: 2.5-3.5 weeks 2.  Antithrombotics: -DVT/anticoagulation:  Pharmaceutical: Lovenox, but given GIB and amb 95' in PT will d/c and add SCD             -antiplatelet therapy:Hx of CVA x 2 home Plavix and aspirin on hold due to GI bleed 3. Pain Management: Tylenol, Lidoderm patch as needed 4. Mood/Behavior/Sleep: LCSW to evaluate and provide emotional support             -antipsychotic agents: n/a 5. Neuropsych/cognition: This patient is not capable of making decisions on his own behalf. 6. Skin/Wound Care: Routine skin checks             -sacral fissure with likely Candidiasis. Apply Nystatin powder.             -continue butt cream to anal area- skin without breakdown in sacro coccygeal area 7. Fluids/Electrolytes/Nutrition: Routine Is and Os and follow-up chemistries 8: Cholecystis s/p cholecystostomy tube; exchanged on 10/24/21             -surgical follow-up at discharge 9: DM: carb mod diet; CBGs q AC and q HS             -continue Semglee 15 units BID             -continue SSI q AC and q HS             -home Glucotrol XL 10 mg and Tresiba 20 units BID not restarted CBG (  last 3)  Recent Labs (last 2 labs)        Recent Labs    10/30/21 1630  10/30/21 2105 10/31/21 0553  GLUCAP 210* 267* 165*    Will monitor prior to changes    10: Hypertension: monitor; home amlodipine 10 mg and lisinopril 40 mg not restarted             -on doxazosin 2 mg daily for urinary retention 11: Urinary retention with Foley re-inserted 10/10             -continue doxazosin             -? restart home Flomax 12: Hyperlipidemia: continue Crestor 13: GIB/GI prophylaxis: s/p 19-20 units pRBCs- continue Protonix but increase to BID             - monitor H and H             -follow-up with GI as outpatient 14: Chronic colitis by biopsy; unknown etiology- negative C diff              -will check cbc monitor stools , FOB likely persistently + given recent massive GI bleed, no reason to recheck  15: Acute respiratory failure s/p trach; decannulated 10/19 16. Probable MS-s/p high dose steroids and Plasma exchange-  17. Encephalopathy- impaired cognition- monitor and might benefit from meds/intervention 18. Low back pain and B/L shoulder pain- will change lidoderm patches to 3 8pm to 8am and con't tylenol- Goes to [facility] for injections- avoid opiates due to sedation/encephalopathy 19. Impaired intake/Dysphagia- on D3 nectart thick liquids- encouraged to drink 6-8 cups/day- I.e more nectar thick liquids.       LOS: 2 days A FACE TO FACE EVALUATION WAS PERFORMED  Erick Colace 11/01/2021, 7:42 AM

## 2021-11-01 NOTE — Progress Notes (Signed)
Occupational Therapy Session Note  Patient Details  Name: Troy Randall MRN: [id number] Date of Birth: Sep 03, 1960  Today's Date: 11/01/2021 OT Individual Time: 8242-3536 & 1000-1045 OT Individual Time Calculation (min): 45 min & 45 min   Short Term Goals: Week 1:  OT Short Term Goal 1 (Week 1): Pt will demonstrate improved awareness of LUE by washing his L arm with no more than 2 cues to fully attend to it. OT Short Term Goal 2 (Week 1): Pt will demonstrate improved motor planning to don shirt with min A and mod cues or less. OT Short Term Goal 3 (Week 1): Pt will be able to stand in stedy and actively pull pants down to prep for toileting. OT Short Term Goal 4 (Week 1): Pt will be able to stand in stedy and actively pull pants up post toileting with min A. OT Short Term Goal 5 (Week 1): Pt wil self feed with supervision.  Skilled Therapeutic Interventions/Progress Updates:   Session 1: Upon OT arrival, pt semi recumbent in bed laying on his L arm reporting no pain and is agreeable to OT treatment. Pt's spouse at bedside. Treatment intervention with a focus on self care retraining, activity tolerance, sitting balance. Pt requires verbal cues to pull his arm out from under him. Pt completes supine to sit transfer with Mod A. Pt requires mod verbal and tactile cues to sequence task. Pt sits EOB with SBA and completes sponge bath ADL at Troy levels below. Pt requires increased time for processing and manual cues to initiate and sequence. Pt performs sit to stand transfer with Troy Randall and Mod A x 2 to manage pants. Pt able to stand within Troy Randall with CGA once standing. Pt completes a second sit to stand transfer with Mod A x 2 to move pt towards Troy Randall before sitting with CGA. Pt completes sit to supine transfer with Min A and was left in bed at end of session with all needs met and safety measures in place.   Session 2: Upon OT arrival, pt semi recumbent in bed laying on Troy L UE. Pt's friend at  bedside. Pt reports no pain and is agreeable to OT treatment. Treatment intervention with a focus on functional transfers, strengthening, and endurance. Pt completes supine to sit transfer with Min A requiring verbal and tactile cues. Pt sits EOB with CGA and attempts to donn socks but was unsuccessful. Pt requires Max A to complete. Pt completes sit to stand transfer with Troy Randall with Mod A x 2. Pt stands up in Troy Randall a couple times with CGA requiring verbal cues to remain seated. Pt completes stand to sit transfer from Troy Randall with CGA into w/c. Pt was transported to ortho gym via w/c and total A and performs arm bike for 13 minutes using B UE. Pt requires mod verbal and tactile cues to keep B UE on handles and to continue task. Pt does not want rest break. Pt was transported back to his room via w/c and total A and completes chair to bed transfer with Max A x 2 performing squat pivot.  Pt completes sit to supine transfer with Min A and was left in bed at end of session with all needs met and safety measures in place.   Therapy Documentation Precautions:  Precautions Precautions: Fall Precaution Comments: R drain Restrictions Weight Bearing Restrictions: No    ADL: Grooming: Minimal assistance, Maximal cueing Where Assessed-Grooming: Edge of bed Upper Body Bathing: Maximal cueing, Supervision/safety Where  Assessed-Upper Body Bathing: Edge of bed Lower Body Bathing: Maximal cueing, Moderate assistance Where Assessed-Lower Body Bathing: Edge of bed Upper Body Dressing: Maximal cueing, Supervision/safety Where Assessed-Upper Body Dressing: Edge of bed Lower Body Dressing: Maximal cueing, Maximal assistance Where Assessed-Lower Body Dressing: Edge of bed (use of stedy)     Therapy/Group: Individual Therapy  Troy Randall 11/01/2021, 10:02 AM

## 2021-11-01 NOTE — Plan of Care (Signed)
  Problem: Consults Goal: RH GENERAL PATIENT EDUCATION Description: See Patient Education module for education specifics. Outcome: Progressing Goal: Diabetes Guidelines if Diabetic/Glucose > 140 Description: If diabetic or lab glucose is > 140 mg/dl - Initiate Diabetes/Hyperglycemia Guidelines & Document Interventions  Outcome: Progressing   Problem: RH BOWEL ELIMINATION Goal: RH STG MANAGE BOWEL WITH ASSISTANCE Description: STG Manage Bowel with mod I Assistance. Outcome: Progressing Goal: RH STG MANAGE BOWEL W/MEDICATION W/ASSISTANCE Description: STG Manage Bowel with Medication with mod I Assistance. Outcome: Progressing   Problem: RH BLADDER ELIMINATION Goal: RH STG MANAGE BLADDER WITH EQUIPMENT WITH ASSISTANCE Description: STG Manage Bladder With Equipment With toileting Assistance Outcome: Progressing   Problem: RH SKIN INTEGRITY Goal: RH STG SKIN FREE OF INFECTION/BREAKDOWN Description: Manage w min assist Outcome: Progressing Goal: RH STG MAINTAIN SKIN INTEGRITY WITH ASSISTANCE Description: STG Maintain Skin Integrity With min Assistance. Outcome: Progressing Goal: RH STG ABLE TO PERFORM INCISION/WOUND CARE W/ASSISTANCE Description: STG Able To Perform Incision/Wound Care With min Assistance. Outcome: Progressing   Problem: RH SAFETY Goal: RH STG ADHERE TO SAFETY PRECAUTIONS W/ASSISTANCE/DEVICE Description: STG Adhere to Safety Precautions With cues Assistance/Device. Outcome: Progressing   Problem: RH PAIN MANAGEMENT Goal: RH STG PAIN MANAGED AT OR BELOW PT'S PAIN GOAL Description: < 4 with prns Outcome: Progressing   Problem: RH KNOWLEDGE DEFICIT GENERAL Goal: RH STG INCREASE KNOWLEDGE OF SELF CARE AFTER HOSPITALIZATION Description: Patient and spouse will be able to manage care at discharge using handouts and educational resources independently Outcome: Progressing   

## 2021-11-01 NOTE — Plan of Care (Signed)
  Problem: RH Balance Goal: LTG Patient will maintain dynamic sitting balance (PT) Description: LTG:  Patient will maintain dynamic sitting balance with assistance during mobility activities (PT) Flowsheets (Taken 11/01/2021 1231) LTG: Pt will maintain dynamic sitting balance during mobility activities with:: Supervision/Verbal cueing Goal: LTG Patient will maintain dynamic standing balance (PT) Description: LTG:  Patient will maintain dynamic standing balance with assistance during mobility activities (PT) Flowsheets (Taken 11/01/2021 1231) LTG: Pt will maintain dynamic standing balance during mobility activities with:: Contact Guard/Touching assist   Problem: Sit to Stand Goal: LTG:  Patient will perform sit to stand with assistance level (PT) Description: LTG:  Patient will perform sit to stand with assistance level (PT) Flowsheets (Taken 11/01/2021 1231) LTG: PT will perform sit to stand in preparation for functional mobility with assistance level: Supervision/Verbal cueing   Problem: RH Bed Mobility Goal: LTG Patient will perform bed mobility with assist (PT) Description: LTG: Patient will perform bed mobility with assistance, with/without cues (PT). Flowsheets (Taken 11/01/2021 1231) LTG: Pt will perform bed mobility with assistance level of: Supervision/Verbal cueing   Problem: RH Bed to Chair Transfers Goal: LTG Patient will perform bed/chair transfers w/assist (PT) Description: LTG: Patient will perform bed to chair transfers with assistance (PT). Flowsheets (Taken 11/01/2021 1231) LTG: Pt will perform Bed to Chair Transfers with assistance level: Contact Guard/Touching assist   Problem: RH Car Transfers Goal: LTG Patient will perform car transfers with assist (PT) Description: LTG: Patient will perform car transfers with assistance (PT). Flowsheets (Taken 11/01/2021 1231) LTG: Pt will perform car transfers with assist:: Minimal Assistance - Patient > 75%   Problem: RH  Ambulation Goal: LTG Patient will ambulate in controlled environment (PT) Description: LTG: Patient will ambulate in a controlled environment, # of feet with assistance (PT). Flowsheets (Taken 11/01/2021 1231) LTG: Pt will ambulate in controlled environ  assist needed:: Contact Guard/Touching assist LTG: Ambulation distance in controlled environment: 141ft with LRAD Goal: LTG Patient will ambulate in home environment (PT) Description: LTG: Patient will ambulate in home environment, # of feet with assistance (PT). Flowsheets (Taken 11/01/2021 1231) LTG: Pt will ambulate in home environ  assist needed:: Contact Guard/Touching assist LTG: Ambulation distance in home environment: 41ft with LRAD   Problem: RH Wheelchair Mobility Goal: LTG Patient will propel w/c in controlled environment (PT) Description: LTG: Patient will propel wheelchair in controlled environment, # of feet with assist (PT) Flowsheets (Taken 11/01/2021 1231) LTG: Pt will propel w/c in controlled environ  assist needed:: Supervision/Verbal cueing LTG: Propel w/c distance in controlled environment: 141ft Goal: LTG Patient will propel w/c in home environment (PT) Description: LTG: Patient will propel wheelchair in home environment, # of feet with assistance (PT). Flowsheets (Taken 11/01/2021 1231) LTG: Pt will propel w/c in home environ  assist needed:: Supervision/Verbal cueing Distance: wheelchair distance in controlled environment: 50   Problem: RH Stairs Goal: LTG Patient will ambulate up and down stairs w/assist (PT) Description: LTG: Patient will ambulate up and down # of stairs with assistance (PT) Flowsheets (Taken 11/01/2021 1231) LTG: Pt will  ambulate up and down number of stairs: 1 step with no rail to access home

## 2021-11-01 NOTE — Progress Notes (Signed)
Upon changing patient nurse tech noticed blood in stool and informed nurse. Nurse assessed patient. Patient denied abdominal pain or nausea. Vital signs were normal except pulse was 105 after activity. Nurse informed Dr about the stool since he has a history of GI bleed. Dr noted that patient will get a CBC this morning and will wait and see. Nurse will continue to monitor.

## 2021-11-01 NOTE — Progress Notes (Signed)
Speech Language Pathology Daily Session Note  Patient Details  Name: RODRIGO MCGRANAHAN MRN: [id number] Date of Birth: [birth year]  Today's Date: 11/01/2021 SLP Individual Time: 1302-1400 SLP Individual Time Calculation (min): 58 min  Short Term Goals: Week 1: SLP Short Term Goal 1 (Week 1): Pt will orient x4 with max A verbal/visual cues with use of external aids SLP Short Term Goal 2 (Week 1): Pt will sustain attention to functional tasks for 3 minute intervals with max A verbal/visual redirection cues SLP Short Term Goal 3 (Week 1): Patient will complete basic, famliar tasks with max A verbal/visual cues for problem solving skills SLP Short Term Goal 4 (Week 1): Patient will follow 1-step commands with max A verbal/visual cues to achieve 50% accuracy SLP Short Term Goal 5 (Week 1): Patient will consume current diet with minimal overt s/sx of aspiration with mod A verbal cues for safe swallowing compensatory strategies  Skilled Therapeutic Interventions:Skilled ST services focused on swallow and cognitive skills. Pt was orientated to general place and situation only. SLP posted visual aids in room and pt required max A fade to min A verbal cues to utilize them. Pt expressed severe gas pain and nursing reported limited PO intake/hydration. SLP facilitated oral care providing mod A verbal cues for problem solving and mod A verbal cues for sustained attention/distracted by pain. Pt consumed thin liquid trials iva ice chip, via cup and via straw. Pt performed Yale 3 oz water test with straw and with cup noting; oral containment, oral clearance, timely swallow and no overt s/s aspiration. SLP reviewed chart noting most recent instrumental swallow assessment FEES noted sensed aspiration that was ejected (due to oral oral control) with nectar thick liquids, thins were not attempted. Nursing reports pt's wife has been giving him thin liquids the past several days and per char review vital signs for temperature  and O2 are WFL. SLP upgraded pt to thin liquids via cup and straw with strategies to limit distractions, small sips and monitor s/s of aspiration with full supervision. Nursing can downgrade to nectar thick if they suspect s/s of aspiration over the weekend. Education on oral care was given. Pt was left in room with friend, call bell within reach and bed alarm set. SLP recommends to continue skilled services.       Pain Pain Assessment Pain Scale: 0-10 Pain Score: 0-No pain Faces Pain Scale: Hurts whole lot Pain Location: Abdomen Pain Descriptors / Indicators: Discomfort Pain Onset: On-going Pain Intervention(s): RN made aware;Relaxation;Rest  Therapy/Group: Individual Therapy  Gamal Todisco  [facility] 11/01/2021, 3:07 PM

## 2021-11-01 NOTE — Progress Notes (Signed)
Physical Therapy Session Note  Patient Details  Name: Troy Randall MRN: [id number] Date of Birth: 03-18-1960  Today's Date: 11/01/2021 PT Individual Time: 1520-1603 PT Individual Time Calculation (min): 43 min   Short Term Goals: Week 1:  PT Short Term Goal 1 (Week 1): Pt will perform bed mobility with mod assist and mod cues for initiation PT Short Term Goal 2 (Week 1): Pt will ambulate 62ft with mod assist of 1 and RW PT Short Term Goal 3 (Week 1): Pt will tolerate being out of bed >1 hour between therapies PT Short Term Goal 4 (Week 1): Pt will attend to task for at least 2 minutes with only mod assist  Skilled Therapeutic Interventions/Progress Updates:  Pt received supine in bed lying partially on L UE with his friend present and pt agreeable to therapy session. Pt unable to recall having OT and SLP this AM. Pt emotionally labile throughout session with spontaneous tearfulness but able to be comforted and redirect to task at hand.   Supine>sitting R EOB, HOB partially elevated, with min assist for trunk upright - pt with inattention to LUE requiring max cuing to reach across with that hand for support. Once sitting EOB pt with mild L lateral trunk lean requiring CGA/min assist to recover but then able to maintain sitting balance with supervision.   Sit>stand EOB>stedy with light mod assist of 1 for coming to stand - throughout session pt requires frequent cuing to redirect attention to the task and initiate movement due to delayed motor planning and initiation. Standing in stedy with CGA for safety and not significant leaning nor BLE weakness noted. Stedy transfer to w/c.    Transported to/from gym in w/c for time management and energy conservation.   Sit>stand w/c>bari-RW with mod assist of 1 for lifting to stand throughout session - continues to require increased time to initiate a motor task.   Gait training 27ft + 11ft + 77ft (seated break between each) using bari-RW with heavy  min/light mod assist of 1 for balance and AD management due to it consistently veering towards L. Pt had 2x instances of mild L knee buckling but able to recover and continue gait training. Pt with significant B LE scissoring (L LE more impaired) causing impaired balance - pt only able to slightly improve this with verbal cuing and visual demonstration.  At end of session, pt left seated in w/c with needs in reach, seat belt alarm on, lines intact, and his friend present.   Therapy Documentation Precautions:  Precautions Precautions: Fall Precaution Comments: R drain Restrictions Weight Bearing Restrictions: No   Pain:  No reports of pain throughout session.    Therapy/Group: Individual Therapy  Tawana Scale , PT, DPT, NCS, CSRS 11/01/2021, 1:00 PM

## 2021-11-02 LAB — GLUCOSE, CAPILLARY
Glucose-Capillary: 138 mg/dL — ABNORMAL HIGH (ref 70–99)
Glucose-Capillary: 217 mg/dL — ABNORMAL HIGH (ref 70–99)
Glucose-Capillary: 163 mg/dL — ABNORMAL HIGH (ref 70–99)
Glucose-Capillary: 166 mg/dL — ABNORMAL HIGH (ref 70–99)

## 2021-11-02 LAB — MRSA NEXT GEN BY PCR, NASAL: MRSA by PCR Next Gen: DETECTED — AB

## 2021-11-02 MED ORDER — MUPIROCIN 2 % EX OINT
TOPICAL_OINTMENT | Freq: Two times a day (BID) | CUTANEOUS | Status: DC
  Filled 2021-11-02: qty 22

## 2021-11-02 NOTE — Progress Notes (Signed)
Physical Therapy Session Note  Patient Details  Name: Troy Randall MRN: [id number] Date of Birth: 01-02-1961  Today's Date: 11/02/2021 PT Individual Time: 0910-1005 PT Individual Time Calculation (min): 55 min   Short Term Goals: Week 1:  PT Short Term Goal 1 (Week 1): Pt will perform bed mobility with mod assist and mod cues for initiation PT Short Term Goal 2 (Week 1): Pt will ambulate 35ft with mod assist of 1 and RW PT Short Term Goal 3 (Week 1): Pt will tolerate being out of bed >1 hour between therapies PT Short Term Goal 4 (Week 1): Pt will attend to task for at least 2 minutes with only mod assist  Skilled Therapeutic Interventions/Progress Updates:    Pt received supine in bed with his wife, Janace Hoard, present and pt agreeable to therapy session. Pt's wife present throughout session to observe pt's CLOF and provide +2 w/c follow for safety.  Supine>sitting R EOB, HOB partially elevated and using bedrail, with min L HHA to pivot over to EOB, but then mod assist to bring trunk upright - continues to demo impaired initiation of L UE into tasks (when thearpist reaches for pt's L hand he reaches his R UE across his body to grab therapist's hand requiring max cuing to correct). Sitting EOB doffed dirty shirt with increased time to problem solve/motor plan how to perform this and donned clean shirt with set-up assist. Donned shoes max assist for time.  Sit>stand partially elevated EOB to standard RW with heavy min assist for lifting to stand and then heavy min assist for balance and turning RW to perform L stand pivot to w/c - pt does well stepping towards seat but requires many, small steps.  Pt's wife confirmed home set-up of 1 step-up into house with a brick wall on 1 side but no HR support and then level inside home. Pt's wife is planning to work part-time from home to provide 24hr support for pt with family living nearby to provide PRN support when wife needs.  Transported to/from gym  in w/c for time management and energy conservation.  L stand pivot w/c>EOM using RW with min assist for balance but primarily for turning AD to complete transfer as pt has significant difficulty motor planning how to manage AD.  Repeated sit<>stands from elevated EOM to RW x5 reps with light min assist and repeated cuing for proper hand placement to push up with 1 hand from seat to target functional B LE strength.  Gait training 51ft x2 + ~69ft (seated breaks between) using RW with heavy min assist of 1 for AD management and balance with +2 w/c follow for safety. Pt demonstrating the following gait deviations with therapist providing the described cuing and facilitation for improvement:  - mild to moderate scissoring noted today (less severe compared to yesterday) - progressed to performing 90degree turns and navigating through doorways with pt having significant difficulty motor planning and sequencing how to turn AD (more impaired turning towards R compared to L due to pt veering AD towards L)  - when turning through doorway pt noted to bump into wall on L side, consistent with L inattention  Pt reporting fatigue towards end of session. Of note: pt with no instances of significant emotional liability as was seen during session yesterday, may have been because his wife was present throughout? Also, pt demos improvement in amount of time it requires him to initiate a mobility task when cued. Transported back to his room and pt left  seated in w/c with needs in reach, seat belt alarm on, lines intact, and his wife and friend present.   Therapy Documentation Precautions:  Precautions Precautions: Fall Precaution Comments: R drain Restrictions Weight Bearing Restrictions: No   Pain:  Denies pain during session.    Therapy/Group: Individual Therapy  Ginny Forth , PT, DPT, NCS, CSRS 11/02/2021, 7:49 AM

## 2021-11-02 NOTE — Plan of Care (Signed)
  Problem: Consults Goal: RH GENERAL PATIENT EDUCATION Description: See Patient Education module for education specifics. Outcome: Progressing Goal: Diabetes Guidelines if Diabetic/Glucose > 140 Description: If diabetic or lab glucose is > 140 mg/dl - Initiate Diabetes/Hyperglycemia Guidelines & Document Interventions  Outcome: Progressing   Problem: RH BOWEL ELIMINATION Goal: RH STG MANAGE BOWEL WITH ASSISTANCE Description: STG Manage Bowel with mod I Assistance. Outcome: Progressing Goal: RH STG MANAGE BOWEL W/MEDICATION W/ASSISTANCE Description: STG Manage Bowel with Medication with mod I Assistance. Outcome: Progressing   Problem: RH BLADDER ELIMINATION Goal: RH STG MANAGE BLADDER WITH EQUIPMENT WITH ASSISTANCE Description: STG Manage Bladder With Equipment With toileting Assistance Outcome: Progressing   Problem: RH SKIN INTEGRITY Goal: RH STG SKIN FREE OF INFECTION/BREAKDOWN Description: Manage w min assist Outcome: Progressing Goal: RH STG MAINTAIN SKIN INTEGRITY WITH ASSISTANCE Description: STG Maintain Skin Integrity With min Assistance. Outcome: Progressing Goal: RH STG ABLE TO PERFORM INCISION/WOUND CARE W/ASSISTANCE Description: STG Able To Perform Incision/Wound Care With min Assistance. Outcome: Progressing   Problem: RH SAFETY Goal: RH STG ADHERE TO SAFETY PRECAUTIONS W/ASSISTANCE/DEVICE Description: STG Adhere to Safety Precautions With cues Assistance/Device. Outcome: Progressing   Problem: RH PAIN MANAGEMENT Goal: RH STG PAIN MANAGED AT OR BELOW PT'S PAIN GOAL Description: < 4 with prns Outcome: Progressing   Problem: RH KNOWLEDGE DEFICIT GENERAL Goal: RH STG INCREASE KNOWLEDGE OF SELF CARE AFTER HOSPITALIZATION Description: Patient and spouse will be able to manage care at discharge using handouts and educational resources independently Outcome: Progressing

## 2021-11-02 NOTE — IPOC Note (Signed)
Overall Plan of Care [facility]) Patient Details Name: Troy Randall MRN: [id number] DOB: March 21, 1960  Admitting Diagnosis: Encephalomyelopathy  Hospital Problems: Principal Problem:   Encephalomyelopathy Active Problems:   MS (multiple sclerosis) (HCC)     Functional Problem List: Nursing Bladder, Bowel, Medication Management, Pain, Safety, Endurance, Skin Integrity  PT Balance, Behavior, Edema, Endurance, Motor, Perception, Safety, Sensory, Skin Integrity, Nutrition, Pain  OT Balance, Cognition, Endurance, Motor, Perception, Safety, Sensory, Vision  SLP Cognition, Linguistic, Nutrition  TR         Basic ADL's: OT Eating, Grooming, Bathing, Dressing, Toileting     Advanced  ADL's: OT       Transfers: PT Bed Mobility, Bed to Chair, Car, [employer], Civil Service fast streamer, Research scientist (life sciences): PT Ambulation, Stairs, Psychologist, prison and probation services     Additional Impairments: OT Fuctional Use of Upper Extremity  SLP Swallowing, Communication, Social Cognition comprehension Problem Solving, Memory, Awareness, Attention, Social Interaction  TR      Anticipated Outcomes Item Anticipated Outcome  Self Feeding mod I  Swallowing  sup A   Basic self-care  CGA with bathing and UB dressing, min A LB dressing  Toileting  min A   Bathroom Transfers min A  Bowel/Bladder  manage bowel w mod I and bladder w toileting  Transfers  CGA for transfers with LRAD  Locomotion  ambulatory at household distance with CGA and LRAD  Communication  sup A  Cognition  min-to-mod A  Pain  < 4 with prns  Safety/Judgment  manage w cues   Therapy Plan: PT Intensity: Minimum of 1-2 x/day ,45 to 90 minutes PT Frequency: 5 out of 7 days PT Duration Estimated Length of Stay: 3-4 weeks OT Intensity: Minimum of 1-2 x/day, 45 to 90 minutes OT Frequency: 5 out of 7 days OT Duration/Estimated Length of Stay: 3 weeks SLP Intensity: Minumum of 1-2 x/day, 30 to 90 minutes SLP Frequency: 3 to 5 out of 7  days SLP Duration/Estimated Length of Stay: 3 weeks   Team Interventions: Nursing Interventions Patient/Family Education, Bladder Management, Bowel Management, Disease Management/Prevention, Pain Management, Medication Management, Skin Care/Wound Management, Discharge Planning  PT interventions Ambulation/gait training, Community reintegration, DME/adaptive equipment instruction, Neuromuscular re-education, Psychosocial support, Museum/gallery curator, Wheelchair propulsion/positioning, UE/LE Strength taining/ROM, Warden/ranger, Discharge planning, Functional electrical stimulation, Pain management, Skin care/wound management, Therapeutic Activities, UE/LE Coordination activities, Cognitive remediation/compensation, Disease management/prevention, Functional mobility training, Patient/family education, Splinting/orthotics, Therapeutic Exercise, Visual/perceptual remediation/compensation  OT Interventions Balance/vestibular training, Cognitive remediation/compensation, DME/adaptive equipment instruction, Discharge planning, Functional mobility training, Neuromuscular re-education, Psychosocial support, Patient/family education, Self Care/advanced ADL retraining, UE/LE Strength taining/ROM, Therapeutic Exercise, Therapeutic Activities, UE/LE Coordination activities, Visual/perceptual remediation/compensation  SLP Interventions Cognitive remediation/compensation, Environmental controls, Internal/external aids, Speech/Language facilitation, Functional tasks, Dysphagia/aspiration precaution training, Patient/family education, Therapeutic Activities  TR Interventions    SW/CM Interventions Discharge Planning, Psychosocial Support, Patient/Family Education, Disease Management/Prevention   Barriers to Discharge MD  Medical stability and Neurogenic bowel and bladder  Nursing Decreased caregiver support, Home environment access/layout, Wound Care 1 level 1 ste left rail w spouse  PT Inaccessible home  environment, Decreased caregiver support, Insurance for SNF coverage    OT      SLP      SW Lack of/limited family support, Community education officer for SNF coverage, Decreased caregiver support, Inaccessible home environment, Home environment access/layout     Team Discharge Planning: Destination: PT-Home ,OT- Home , SLP-Home Projected Follow-up: PT-Home health PT, OT-  Home health OT, SLP-24 hour supervision/assistance, Home Health SLP  Projected Equipment Needs: PT-To be determined, Rolling walker with 5" wheels, Wheelchair (measurements), Wheelchair cushion (measurements), OT- To be determined, SLP-To be determined Equipment Details: PT- , OT-  Patient/family involved in discharge planning: PT- Patient, Family Midwife,  OT-Patient, Family member/caregiver, SLP-Family member/caregiver  MD ELOS: 13-16d Medical Rehab Prognosis:  Good Assessment: The patient has been admitted for CIR therapies with the diagnosis of Encephalomyelopathy. The team will be addressing functional mobility, strength, stamina, balance, safety, adaptive techniques and equipment, self-care, bowel and bladder mgt, patient and caregiver education, Management of colitis. Goals have been set at [facility]. Anticipated discharge destination is Home with wife.        See Team Conference Notes for weekly updates to the plan of care

## 2021-11-03 LAB — CBC
HCT: 29.9 % — ABNORMAL LOW (ref 39.0–52.0)
Hemoglobin: 9.5 g/dL — ABNORMAL LOW (ref 13.0–17.0)
MCH: 28 pg (ref 26.0–34.0)
MCHC: 31.8 g/dL (ref 30.0–36.0)
MCV: 88.2 fL (ref 80.0–100.0)
Platelets: 227 10*3/uL (ref 150–400)
RBC: 3.39 MIL/uL — ABNORMAL LOW (ref 4.22–5.81)
RDW: 14.8 % (ref 11.5–15.5)
WBC: 6.6 10*3/uL (ref 4.0–10.5)
nRBC: 0 % (ref 0.0–0.2)

## 2021-11-03 LAB — GLUCOSE, CAPILLARY
Glucose-Capillary: 123 mg/dL — ABNORMAL HIGH (ref 70–99)
Glucose-Capillary: 264 mg/dL — ABNORMAL HIGH (ref 70–99)
Glucose-Capillary: 191 mg/dL — ABNORMAL HIGH (ref 70–99)
Glucose-Capillary: 183 mg/dL — ABNORMAL HIGH (ref 70–99)

## 2021-11-03 LAB — BASIC METABOLIC PANEL
Anion gap: 10 (ref 5–15)
BUN: 7 mg/dL (ref 6–20)
CO2: 25 mmol/L (ref 22–32)
Calcium: 8.6 mg/dL — ABNORMAL LOW (ref 8.9–10.3)
Chloride: 106 mmol/L (ref 98–111)
Creatinine, Ser: 0.7 mg/dL (ref 0.61–1.24)
GFR, Estimated: >60 mL/min (ref >60)
Glucose, Bld: 141 mg/dL — ABNORMAL HIGH (ref 70–99)
Potassium: 3.8 mmol/L (ref 3.5–5.1)
Sodium: 141 mmol/L (ref 135–145)

## 2021-11-03 LAB — MRSA NEXT GEN BY PCR, NASAL: MRSA by PCR Next Gen: DETECTED — AB

## 2021-11-03 NOTE — Progress Notes (Signed)
Speech Language Pathology Daily Session Note  Patient Details  Name: Troy Randall MRN: [id number] Date of Birth: 1960-11-27  Today's Date: 11/03/2021 SLP Individual Time: 4431-5400 SLP Individual Time Calculation (min): 49 min  Short Term Goals: Week 1: SLP Short Term Goal 1 (Week 1): Pt will orient x4 with max A verbal/visual cues with use of external aids SLP Short Term Goal 2 (Week 1): Pt will sustain attention to functional tasks for 3 minute intervals with max A verbal/visual redirection cues SLP Short Term Goal 3 (Week 1): Patient will complete basic, famliar tasks with max A verbal/visual cues for problem solving skills SLP Short Term Goal 4 (Week 1): Patient will follow 1-step commands with max A verbal/visual cues to achieve 50% accuracy SLP Short Term Goal 5 (Week 1): Patient will consume current diet with minimal overt s/sx of aspiration with mod A verbal cues for safe swallowing compensatory strategies  Skilled Therapeutic Interventions: Skilled ST treatment focused on cognitive goals. Pt was accompanied by spouse who is very supportive. SLP facilitated session by providing max-to-total A for orientation to place/time/situation using external aids; max A verbal cues to retain information following 30 second delay; max-to-total A multimodal cues for sorting cards by color between 2 piles; max A verbal redirection cues for sustained attention for 3 minute duration. Pt was internally distracted and required constant verbal redirection. Pt was able to sit EOB for card sorting task and rapport building for ~10 minute duration. Pt required max A to follow 1-step commands in order to make minor adjustments in bed. Pt was observed consuming thin liquids by straw without overt s/sx of aspiration and clear vocal quality post swallows. Spouse reported pt has increased hydration since liquid advancement. Patient was left in bed with alarm activated and immediate needs within reach at end of  session. Continue per current plan of care.       Pain  Unable to identify pain on number scale; main pain location back and buttocks which is ongoing. Pt benefited from repositioning throughout session.  Therapy/Group: Individual Therapy  Patty Sermons 11/03/2021, 3:39 PM

## 2021-11-03 NOTE — Progress Notes (Signed)
Physical Therapy Session Note  Patient Details  Name: Troy Randall MRN: [id number] Date of Birth: 18-Feb-1960  Today's Date: 11/03/2021 PT Individual Time: 1000-1115 PT Individual Time Calculation (min): 75 min   Short Term Goals: Week 1:  PT Short Term Goal 1 (Week 1): Pt will perform bed mobility with mod assist and mod cues for initiation PT Short Term Goal 2 (Week 1): Pt will ambulate 54ft with mod assist of 1 and RW PT Short Term Goal 3 (Week 1): Pt will tolerate being out of bed >1 hour between therapies PT Short Term Goal 4 (Week 1): Pt will attend to task for at least 2 minutes with only mod assist  Skilled Therapeutic Interventions/Progress Updates: Pt presents sitting in w/c and agreeable to therapy, spouse present.  Pt wheeled to main gym for time and energy conservation.  Pt performed multiple sit to stand transfers throughout session w/ min A, although initially pt attempts to stand w/ hands on RW and unable to perform.  Pt requires verbal as well as occasional hand over hand to perform w/ min A.  Pt will scoot self forward w/o cueing, and sit to stand w/ hands in correct position on w/c arm rests x 30% of time (w/o any cueing other than "Stand up".)  Pt amb w/ RW and mod A x 35' to mat table, mod A for negotiation of RW around obstacles.  Pt given visual cues to look for to assist w/ gait safety, but still requires hands-on A (orange cones to go around, but will still try to walk through them, red tape on floor for turning into w/c.)  Pt performed sit to stand x 5 w/o UE support w/ mod A to min A.  Pt more emotionally labile this session, tearful when talking about SIL.  Pt performed standing reaching overhead to hang horseshoes over Bball hoop.  Pt requires verbal cues for laterality, although incorrect side stated x 60% of time.  Pt reaching for horseshoes on table to left and placing on chair to right although hangs them on RW instead unless hand over hand.  Pt amb x 45' including  turns to return to w/c w/ mod A for walker management.  Pt returned to room and remained sitting in w/c w/ spouse present.  Chair alarm on and all needs in reach.     Therapy Documentation Precautions:  Precautions Precautions: Fall Precaution Comments: R drain Restrictions Weight Bearing Restrictions: No General:   Vital Signs:   Pain:0/10 Pain Assessment Pain Scale: 0-10 Pain Score: 0-No pain Mobility:      Therapy/Group: Individual Therapy  Ladoris Gene 11/03/2021, 11:19 AM

## 2021-11-03 NOTE — Progress Notes (Signed)
Occupational Therapy Session Note  Patient Details  Name: Troy Randall MRN: [id number] Date of Birth: [birth year]  Today's Date: 11/03/2021 OT Individual Time: [phone] OT Individual Time Calculation (min): 75 min    Short Term Goals: Week 1:  OT Short Term Goal 1 (Week 1): Pt will demonstrate improved awareness of LUE by washing his L arm with no more than 2 cues to fully attend to it. OT Short Term Goal 2 (Week 1): Pt will demonstrate improved motor planning to don shirt with min A and mod cues or less. OT Short Term Goal 3 (Week 1): Pt will be able to stand in stedy and actively pull pants down to prep for toileting. OT Short Term Goal 4 (Week 1): Pt will be able to stand in stedy and actively pull pants up post toileting with min A. OT Short Term Goal 5 (Week 1): Pt wil self feed with supervision.  Skilled Therapeutic Interventions/Progress Updates:     Pt received resting in bed following breakfast with wife present in room. Pt receptive to participating in skilled OT session with a focus on self-care retraining, activity tolerance, sitting balance, and cognitive retraining. Pt reported pain in back and around drainage site. MD entering room during session, checked drainage site and reported it was clean and intact.  ADL: Pt supine>sitting EOB CGA with Hob elevated using bed features. Pt instructed to doff his shirt with verbal and tactile cues required for Pt to initiate task. Moderate verbal cues required for pt to problem solve bringing shirt over his head. Pt donned shirt with min A sitting EOB. Pt donned pants with mod A to weave feet into pants and for catheter management. Pt sit>stand to bring pants over his waist min A with LLE blocked to prevent knee buckling. Pt ambulated to his wc ~5 feet with RW mod A heavily pushing to the R side and max A to manage RW. Pt completed grooming tasks brushing his teeth and washing his face at the sink WC level. Pt required ++time to follow one  step verbal cues as well as tactile and gestural cues. Self care items were presented on Pt's left side throughout session to increase awareness to L side and L visual field.  Therapeutic Activity: Pt was transported from room to therapy gym in wc total A. Upon arriving to therapy gym, Pt practiced sit>stand x2 from his wc with RW min A. Following second sit >stand Pt instructed to take steps towards mat. Pt L knee buckled when he attempted to take his first step with therapist providing mod A to help Pt maintain his balance. Pt slowly sat down in his wc completing session wc level d/t Pt request. Pt completed functional reach task sitting in front of vertical mirror to increase Pt body awareness and provide visual feedback to correct posture. Pt required increased time to process one step directions and moderate verbal cues to attend to left. Pt was transported back to his room and left sitting in his wc with seat belt alarm on, call bell in reach, and all needs met with wife present in room.  Therapy Documentation Precautions:  Precautions Precautions: Fall Precaution Comments: R drain Restrictions Weight Bearing Restrictions: No General:   Vital Signs: Therapy Vitals Temp: 97.9 F (36.6 C) Pulse Rate: 93 Resp: 16 BP: 137/82 Patient Position (if appropriate): Lying Oxygen Therapy SpO2: 97 % O2 Device: Room Air    Therapy/Group: Individual Therapy  Janey Genta 11/03/2021, 9:10 AM

## 2021-11-03 NOTE — Progress Notes (Signed)
PROGRESS NOTE   Subjective/Complaints:  Patient complaining of chills this AM, along with pain from drain site. States he has felt like this for approximately one week, unchanged. No other complaints.    ROS: Denies fevers, chills, N/V, abdominal pain, constipation, diarrhea, SOB, cough, chest pain, new weakness or paraesthesias.    Objective:   No results found. Recent Labs    11/01/21 1843 11/03/21 0511  WBC 9.3 6.6  HGB 10.6* 9.5*  HCT 33.6* 29.9*  PLT 230 227    Recent Labs    11/03/21 0511  NA 141  K 3.8  CL 106  CO2 25  GLUCOSE 141*  BUN 7  CREATININE 0.70  CALCIUM 8.6*     Intake/Output Summary (Last 24 hours) at 11/03/2021 1220 Last data filed at 11/03/2021 0842 Gross per 24 hour  Intake 240 ml  Output 1210 ml  Net -970 ml      Pressure Injury 10/06/21 Sacrum Medial Stage 2 -  Partial thickness loss of dermis presenting as a shallow open injury with a red, pink wound bed without slough. (Active)  10/06/21 2000  Location: Sacrum  Location Orientation: Medial  Staging: Stage 2 -  Partial thickness loss of dermis presenting as a shallow open injury with a red, pink wound bed without slough.  Wound Description (Comments):   Present on Admission: Yes    Physical Exam: Vital Signs Blood pressure 137/82, pulse 93, temperature 97.9 F (36.6 C), resp. rate 16, height 6' (1.829 m), weight 120.3 kg, SpO2 97 %.   General: No acute distress Mood and affect are appropriate Heart: Regular rate and rhythm no rubs murmurs or extra sounds Lungs: Clear to auscultation, breathing unlabored, no rales or wheezes Abdomen: Positive bowel sounds, soft nontender to palpation, nondistended + RLQ drain with minimal output, drain site appears c/d/I, sutured in place.  Extremities: No clubbing, cyanosis, or edema  Skin: No evidence of breakdown, no evidence of rash, +sacral heart.  Neurologic: Cranial nerves II  through XII intact, motor strength is 4/5 in bilateral deltoid, bicep, tricep, grip, hip flexor, knee extensors, ankle dorsiflexor and plantar flexor Sensory exam normal sensation to light touch and proprioception in bilateral upper and lower extremities Cerebellar exam normal finger to nose to finger as well as heel to shin in bilateral upper and lower extremities Musculoskeletal: Full range of motion in all 4 extremities. No joint swelling   Assessment/Plan: 1. Functional deficits which require 3+ hours per day of interdisciplinary therapy in a comprehensive inpatient rehab setting. Physiatrist is providing close team supervision and 24 hour management of active medical problems listed below. Physiatrist and rehab team continue to assess barriers to discharge/monitor patient progress toward functional and medical goals  Care Tool:  Bathing    Body parts bathed by patient: Chest, Abdomen, Face, Left arm, Right arm   Body parts bathed by helper: Front perineal area, Buttocks, Right upper leg, Left upper leg, Right lower leg, Left lower leg     Bathing assist Assist Level: Maximal Assistance - Patient 24 - 49% (max cues)     Upper Body Dressing/Undressing Upper body dressing   What is the patient wearing?: Pull over shirt  Upper body assist Assist Level: Maximal Assistance - Patient 25 - 49%    Lower Body Dressing/Undressing Lower body dressing      What is the patient wearing?: Incontinence brief, Pants     Lower body assist Assist for lower body dressing: Total Assistance - Patient < 25%     Toileting Toileting Toileting Activity did not occur Press photographer and hygiene only): N/A (no void or bm)  Toileting assist Assist for toileting: Dependent - Patient 0%     Transfers Chair/bed transfer  Transfers assist     Chair/bed transfer assist level: Moderate Assistance - Patient 50 - 74%     Locomotion Ambulation   Ambulation assist      Assist level:  Moderate Assistance - Patient 50 - 74% Assistive device: Walker-rolling Max distance: 45   Walk 10 feet activity   Assist     Assist level: Moderate Assistance - Patient - 50 - 74% Assistive device: Walker-rolling   Walk 50 feet activity   Assist Walk 50 feet with 2 turns activity did not occur: Safety/medical concerns         Walk 150 feet activity   Assist Walk 150 feet activity did not occur: Safety/medical concerns         Walk 10 feet on uneven surface  activity   Assist           Wheelchair     Assist Is the patient using a wheelchair?: Yes Type of Wheelchair: Manual    Wheelchair assist level: Minimal Assistance - Patient > 75% Max wheelchair distance: 50    Wheelchair 50 feet with 2 turns activity    Assist        Assist Level: Minimal Assistance - Patient > 75%   Wheelchair 150 feet activity     Assist      Assist Level: Moderate Assistance - Patient 50 - 74%   Blood pressure 137/82, pulse 93, temperature 97.9 F (36.6 C), resp. rate 16, height 6' (1.829 m), weight 120.3 kg, SpO2 97 %.  Medical Problem List and Plan: 1. 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 acute respiratory failure, and cholecystitis.             -patient may  shower- cover drain             -ELOS/Goals: 2.5-3.5 weeks 2.  Antithrombotics: -DVT/anticoagulation:  Pharmaceutical: Lovenox, but given GIB and amb 95' in PT will d/c and add SCD             -antiplatelet therapy:Hx of CVA x 2 home Plavix and aspirin on hold due to GI bleed 3. Pain Management: Tylenol, Lidoderm patch as needed           - Drain site pain - drain appears c/d/I; at max lidoderm patches, can consider cutting or moving over drain site   4. Mood/Behavior/Sleep: LCSW to evaluate and provide emotional support             -antipsychotic  agents: n/a 5. Neuropsych/cognition: This patient is not capable of making decisions on his own behalf. 6. Skin/Wound Care: Routine skin checks             -sacral fissure with likely Candidiasis. Apply Nystatin powder.             -continue butt cream to anal area- skin without breakdown in sacro coccygeal area 7.  Fluids/Electrolytes/Nutrition: Routine Is and Os and follow-up chemistries             - 10/30: labs stable  8: Cholecystis s/p cholecystostomy tube; exchanged on 10/24/21             -surgical follow-up at discharge 9: DM: carb mod diet; CBGs q AC and q HS             -continue Semglee 15 units BID             -continue SSI q AC and q HS             -home Glucotrol XL 10 mg and Tresiba 20 units BID not restarted Recent Labs    11/02/21 2045 11/03/21 0546 11/03/21 1135  GLUCAP 166* 123* 264*       10: Hypertension: monitor; home amlodipine 10 mg and lisinopril 40 mg not restarted             -on doxazosin 2 mg daily for urinary retention 11: Urinary retention with Foley re-inserted 10/10             -continue doxazosin             -? restart home Flomax - defer to primary, Bps appear WNL  12: Hyperlipidemia: continue Crestor 13: GIB/GI prophylaxis: s/p 19-20 units pRBCs- continue Protonix but increase to BID             - monitor H and H             -follow-up with GI as outpatient 14: Chronic colitis by biopsy; unknown etiology- negative C diff              -will check cbc monitor stools , FOB likely persistently + given recent massive GI bleed, no reason to recheck               - 10/30: HgB stable, no leukocytosis, ragged stool documented 1x overnight; monitor  15: Acute respiratory failure s/p trach; decannulated 10/19 16. Probable MS-s/p high dose steroids and Plasma exchange-  17. Encephalopathy- impaired cognition- monitor and might benefit from meds/intervention 18. Low back pain and B/L shoulder pain- will change lidoderm patches to 3 8pm to 8am and con't  tylenol- Goes to [facility] for injections- avoid opiates due to sedation/encephalopathy  19. Impaired intake/Dysphagia- on D3 nectart thick liquids- encouraged to drink 6-8 cups/day- I.e more nectar thick liquids.              LOS: 4 days A FACE TO FACE EVALUATION WAS PERFORMED  Angelina Sheriff 11/03/2021, 12:20 PM

## 2021-11-04 DIAGNOSIS — G35 Multiple sclerosis: Secondary | ICD-10-CM

## 2021-11-04 LAB — GLUCOSE, CAPILLARY
Glucose-Capillary: 145 mg/dL — ABNORMAL HIGH (ref 70–99)
Glucose-Capillary: 270 mg/dL — ABNORMAL HIGH (ref 70–99)
Glucose-Capillary: 205 mg/dL — ABNORMAL HIGH (ref 70–99)
Glucose-Capillary: 183 mg/dL — ABNORMAL HIGH (ref 70–99)

## 2021-11-04 NOTE — Progress Notes (Signed)
Physical Therapy Session Note  Patient Details  Name: Troy Randall MRN: [id number] Date of Birth: 03/08/1960  Today's Date: 11/04/2021 PT Individual Time: 1048-1203 PT Individual Time Calculation (min): 75 min   Short Term Goals: Week 1:  PT Short Term Goal 1 (Week 1): Pt will perform bed mobility with mod assist and mod cues for initiation PT Short Term Goal 2 (Week 1): Pt will ambulate 93f with mod assist of 1 and RW PT Short Term Goal 3 (Week 1): Pt will tolerate being out of bed >1 hour between therapies PT Short Term Goal 4 (Week 1): Pt will attend to task for at least 2 minutes with only mod assist  Skilled Therapeutic Interventions/Progress Updates:     Pt greeted supine in bed and agreeable to therapy. Pt reporting increased pain around abdominal bandages, a 9/10. Nursing notified and administered Tylenol. Immediately after meds pt reported feeling better and pain decreased to 5/10.  Cognition a limiting factor throughout session today. Foley catheter managed throughout session   Supine>sit with heavy use of bed railing and min assist for trunk management. Total assist to don shoes for time management.  Sit to stand from EOB to RW with elevated bed and min assist for safety. Stand pivot transfer to the right from EOB to WC using RW and min assist for safety.   [facility] and from room and main [facility]   Sit to stand with min [facility] Frequent cues for hand placement on WC arm rests.  Stand to sit with CGA.   Gait training:   Pt demonstrating the following gait deviations and SPT providing the described cuing and facilitation for improvement:    - scissoring gait with frequent cues to increase step width, but pt unable to put into practice.  -increased swaying  -occasional R knee buckle, but pt able to catch himself.   Distances and AD (seated rest breaks taken between):  -15 ft with RW and min assist  for balance. Heavy assistance needed for RW management due to pt veering heavily to the left and inability to self correct.  - 1728fwith min assist + 2 in "3 musketeer" with third person WC follow.  -6439fith min assist under L arm and pt holding on to R hand railing in hallway.  - 53f29fth min assist + 2 and "3 musketeer" with pt showing improvement in scissoring gait this set.   Attempted side stepping both outside and inside // bars to increase hip abd AROM and strength, but pt unable to understand after demonstration and visual, tactile, and verbal cuing.  Also, attempted Kinetron, but pt also unable to understanding placing feet onto pedals and pushing feet up and down.  Pt complaining of pain in buttocks with sitting twice during session. Skin checked when back in room with no evidence of skin breakdown.   Stand pivot [facility] to the right [facility]W and min assist for balance.  Sit > R side lying with CGA.   Pt left R sidelying in bed with call bell in reach, bed alarm on, all needs met.   Therapy Documentation Precautions:  Precautions Precautions: Fall Precaution Comments: R drain Restrictions Weight Bearing Restrictions: No  Therapy/Group: Individual Therapy  AnneKayleen MemosT 11/04/2021, 7:56 AM

## 2021-11-04 NOTE — Progress Notes (Signed)
PROGRESS NOTE   Subjective/Complaints: No c/os discussed another voiding trial with pt and wife Pt remains confused needs some gestural cues to follow simple commands    ROS: Denies fevers, chills, N/V, abdominal pain, constipation, diarrhea, SOB, cough, chest pain, new weakness or paraesthesias.    Objective:   No results found. Recent Labs    11/01/21 1843 11/03/21 0511  WBC 9.3 6.6  HGB 10.6* 9.5*  HCT 33.6* 29.9*  PLT 230 227    Recent Labs    11/03/21 0511  NA 141  K 3.8  CL 106  CO2 25  GLUCOSE 141*  BUN 7  CREATININE 0.70  CALCIUM 8.6*     Intake/Output Summary (Last 24 hours) at 11/04/2021 0725 Last data filed at 11/04/2021 0357 Gross per 24 hour  Intake 1085 ml  Output 400 ml  Net 685 ml      Pressure Injury 10/06/21 Sacrum Medial Stage 2 -  Partial thickness loss of dermis presenting as a shallow open injury with a red, pink wound bed without slough. (Active)  10/06/21 2000  Location: Sacrum  Location Orientation: Medial  Staging: Stage 2 -  Partial thickness loss of dermis presenting as a shallow open injury with a red, pink wound bed without slough.  Wound Description (Comments):   Present on Admission: Yes    Physical Exam: Vital Signs Blood pressure (!) 143/77, pulse 99, temperature 98.2 F (36.8 C), resp. rate 16, height 6' (1.829 m), weight 120.3 kg, SpO2 97 %.   General: No acute distress Mood and affect are appropriate Heart: Regular rate and rhythm no rubs murmurs or extra sounds Lungs: Clear to auscultation, breathing unlabored, no rales or wheezes Abdomen: Positive bowel sounds, soft nontender to palpation, nondistended + RLQ drain with minimal output, drain site appears c/d/I, sutured in place.  Extremities: No clubbing, cyanosis, or edema  Skin: No evidence of breakdown, no evidence of rash, +sacral heart.  Neurologic: Cranial nerves II through XII intact, motor  strength is 4/5 in bilateral deltoid, bicep, tricep, grip, hip flexor, knee extensors, ankle dorsiflexor and plantar flexor Sensory exam normal sensation to light touch and proprioception in bilateral upper and lower extremities Cerebellar exam normal finger to nose to finger as well as heel to shin in bilateral upper and lower extremities Musculoskeletal: Full range of motion in all 4 extremities. No joint swelling   Assessment/Plan: 1. Functional deficits which require 3+ hours per day of interdisciplinary therapy in a comprehensive inpatient rehab setting. Physiatrist is providing close team supervision and 24 hour management of active medical problems listed below. Physiatrist and rehab team continue to assess barriers to discharge/monitor patient progress toward functional and medical goals  Care Tool:  Bathing    Body parts bathed by patient: Chest, Abdomen, Face, Left arm, Right arm   Body parts bathed by helper: Front perineal area, Buttocks, Right upper leg, Left upper leg, Right lower leg, Left lower leg     Bathing assist Assist Level: Maximal Assistance - Patient 24 - 49% (max cues)     Upper Body Dressing/Undressing Upper body dressing   What is the patient wearing?: Pull over shirt    Upper body  assist Assist Level: Maximal Assistance - Patient 25 - 49%    Lower Body Dressing/Undressing Lower body dressing      What is the patient wearing?: Incontinence brief, Pants     Lower body assist Assist for lower body dressing: Total Assistance - Patient < 25%     Toileting Toileting Toileting Activity did not occur Landscape architect and hygiene only): N/A (no void or bm)  Toileting assist Assist for toileting: Dependent - Patient 0%     Transfers Chair/bed transfer  Transfers assist     Chair/bed transfer assist level: Moderate Assistance - Patient 50 - 74%     Locomotion Ambulation   Ambulation assist      Assist level: Moderate Assistance -  Patient 50 - 74% Assistive device: Walker-rolling Max distance: 45   Walk 10 feet activity   Assist     Assist level: Moderate Assistance - Patient - 50 - 74% Assistive device: Walker-rolling   Walk 50 feet activity   Assist Walk 50 feet with 2 turns activity did not occur: Safety/medical concerns         Walk 150 feet activity   Assist Walk 150 feet activity did not occur: Safety/medical concerns         Walk 10 feet on uneven surface  activity   Assist           Wheelchair     Assist Is the patient using a wheelchair?: Yes Type of Wheelchair: Manual    Wheelchair assist level: Minimal Assistance - Patient > 75% Max wheelchair distance: 50    Wheelchair 50 feet with 2 turns activity    Assist        Assist Level: Minimal Assistance - Patient > 75%   Wheelchair 150 feet activity     Assist      Assist Level: Moderate Assistance - Patient 50 - 74%   Blood pressure (!) 143/77, pulse 99, temperature 98.2 F (36.8 C), resp. rate 16, height 6' (1.829 m), weight 120.3 kg, SpO2 97 %.  Medical Problem List and Plan: 1. 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 acute respiratory failure, and cholecystitis.             -patient may  shower- cover drain             -ELOS/Goals: 2.5-3.5 weeks 2.  Antithrombotics: -DVT/anticoagulation:  Pharmaceutical: Lovenox, but given GIB and amb 95' in PT will d/c and add SCD             -antiplatelet therapy:Hx of CVA x 2 home Plavix and aspirin on hold due to GI bleed 3. Pain Management: Tylenol, Lidoderm patch as needed           - Drain site pain - drain appears c/d/I; at max lidoderm patches, can consider cutting or moving over drain site   4. Mood/Behavior/Sleep: LCSW to evaluate and provide emotional support             -antipsychotic agents: n/a 5.  Neuropsych/cognition: This patient is not capable of making decisions on his own behalf. 6. Skin/Wound Care: Routine skin checks             -sacral fissure with likely Candidiasis. Apply Nystatin powder.             -continue butt cream to anal area- skin without breakdown in sacro coccygeal area 7. Fluids/Electrolytes/Nutrition:  Routine Is and Os and follow-up chemistries             - 10/30: labs stable  8: Cholecystis s/p cholecystostomy tube; exchanged on 10/24/21             -surgical follow-up at discharge 9: DM: carb mod diet; CBGs q AC and q HS             -continue Semglee 15 units BID             -continue SSI q AC and q HS             -home Glucotrol XL 10 mg and Tresiba 20 units BID not restarted Recent Labs    11/03/21 1629 11/03/21 2044 11/04/21 0618  GLUCAP 191* 183* 145*   Controlled on Semglee     10: Hypertension: monitor; home amlodipine 10 mg and lisinopril 40 mg not restarted             -on doxazosin 2 mg daily for urinary retention Vitals:   11/03/21 2038 11/04/21 0348  BP: (!) 143/77   Pulse: 87 99  Resp: 17 16  Temp: (!) 97.5 F (36.4 C) 98.2 F (36.8 C)  SpO2: 99% 97%    11: Urinary retention with Foley re-inserted 10/10             -continue doxazosin, no need for flomax Wait for mobility to improve prior to voiding trial   12: Hyperlipidemia: continue Crestor 13: GIB/GI prophylaxis: s/p 19-20 units pRBCs- continue Protonix but increase to BID             - monitor H and H             -follow-up with GI as outpatient    Latest Ref Rng & Units 11/03/2021    5:11 AM 11/01/2021    6:43 PM 10/31/2021    5:42 AM  CBC  WBC 4.0 - 10.5 K/uL 6.6  9.3  12.7   Hemoglobin 13.0 - 17.0 g/dL 9.5  10.6  10.0   Hematocrit 39.0 - 52.0 % 29.9  33.6  33.2   Platelets 150 - 400 K/uL 227  230  272     14: Chronic colitis by biopsy; unknown etiology- negative C diff              -will check cbc monitor stools , FOB likely persistently + given recent massive  GI bleed, no reason to recheck               - 10/30: HgB stable, no leukocytosis, ragged stool documented 1x overnight; monitor  15: Acute respiratory failure s/p trach; decannulated 10/19 16. Probable MS-s/p high dose steroids and Plasma exchange-  17. Encephalopathy- impaired cognition- monitor and might benefit from meds/intervention 18. Low back pain and B/L shoulder pain- will change lidoderm patches to 3 8pm to 8am and con't tylenol- Goes to [facility] for injections- avoid opiates due to sedation/encephalopathy  19. Impaired intake/Dysphagia- on D3 nectart thick liquids- encouraged to drink 6-8 cups/day- I.e more nectar thick liquids.              LOS: 5 days A FACE TO FACE EVALUATION WAS PERFORMED  Charlett Blake 11/04/2021, 7:25 AM

## 2021-11-04 NOTE — Discharge Instructions (Signed)
Inpatient Rehab Discharge Instructions  Troy Randall Discharge date and time:   Activities/Precautions/ Functional Status: Activity: no lifting, driving, or strenuous exercise until cleared by MD Diet:  dysphagia 3/nectar thick Wound Care: keep wound clean and dry Functional status:  ___ No restrictions     ___ Walk up steps independently _x__ 24/7 supervision/assistance   ___ Walk up steps with assistance ___ Intermittent supervision/assistance  ___ Bathe/dress independently ___ Walk with walker     ___ Bathe/dress with assistance ___ Walk Independently    ___ Shower independently ___ Walk with assistance    __x_ Shower with assistance __x_ No alcohol     ___ Return to work/school ________ Special Instructions: No driving, alcohol consumption or tobacco use.            My questions have been answered and I understand these instructions. I will adhere to these goals and the provided educational materials after my discharge from the hospital.  Patient/Caregiver Signature _______________________________ Date __________  Clinician Signature _______________________________________ Date __________  Please bring this form and your medication list with you to all your follow-up doctor's appointments.

## 2021-11-04 NOTE — Progress Notes (Signed)
Occupational Therapy Session Note  Patient Details  Name: Troy Randall MRN: [id number] Date of Birth: 05/25/1960  Today's Date: 11/04/2021 OT Individual Time: 08:30-09:30 OT Individual Time Calculation (min): 45 min  OT Individual Time: 1345-1500 OT Individual Time Calculation (min): 75 min    Short Term Goals: Week 1:  OT Short Term Goal 1 (Week 1): Pt will demonstrate improved awareness of LUE by washing his L arm with no more than 2 cues to fully attend to it. OT Short Term Goal 2 (Week 1): Pt will demonstrate improved motor planning to don shirt with min A and mod cues or less. OT Short Term Goal 3 (Week 1): Pt will be able to stand in stedy and actively pull pants down to prep for toileting. OT Short Term Goal 4 (Week 1): Pt will be able to stand in stedy and actively pull pants up post toileting with min A. OT Short Term Goal 5 (Week 1): Pt wil self feed with supervision.  Skilled Therapeutic Interventions/Progress Updates:     Session 1: Pt received resting in bed following breakfast reporting fatigue willing to participate in skilled OT session. Pt practiced bed mobility supine to sitting using bed features with min A and moderate verbal cues for problem solving. Pt required increased time to process commands as well as demonstration of HOH facilitation to initiate motor commands. Sitting Eob Pt donned his shirt with set-up A following one step command. Pt distracted following task calling out for "mama" and attending to task for ~5 seconds at a time. Pt redirected and provided with Max A to weave legs through his brief and pants d/t catheter management. Pt sit>stand to pull pants to waist min A. Pt ambulated to wc with mod A and max A to maneuver RW. Pt brushed his teeth while sitting in his wc with mod A to manage suction toothbrush. Pt stated he was hurting and requested to return to bed. Pt ambulated ~50f to bed with mod A sit>supine mod A to lift legs. Pt was left resting in his  bed with bed alarm on, call bell in reach, and all needs met.   Session 2.: Pt received resting in bed with wife present in room. Pt agreeable to OT session with focus on increasing functional use of LUE, increasing awareness of L side, activity tolerance, and ADL retraining. Pt reporting he was tired upon OT arrival and emotional labile throughout session with therapeutic support, listening, and motivation provided. Pt reported he wanted to take a shower today. Pt and therapist discussed potential of taking a shower with Pt agreeing to take a sponge bath at the sink today to increase Pt safety due to low energy level and decreased activity tolerance. Pt supine>sitting Eob min A with HOB elevated using bed features to lift trunk. Pt completed UB bathing with moderate assistance and maximal cues. Self care items and toiletries presented on L side throughout session to increase Pt awareness to L side as well as therapist standing on L side. Pt required multimodal cues to turn on/off the water, to squeeze out the wash cloth, to apply soap, and to continue to the next step during bathing. Pt perseverating on washing one single spot on chest and on stomach requiring moderate verbal cues to wash different body part. [facility] to use L hand during bathing. Pt demonstrating apraxia , decreased attention, and challenges motor planning during bathing tasks. Following bathing, Pt donned shirt with single verbal cue with set up  assist.  Pt practiced functional mobility navigating bedroom with RW. Pt required min-mod A with moderate verbal cueing to navigate obstacles in room and maintain standing balance due to posterior and right lean in standing. Pt requires max A to maneuver RW. Pt returned to supine in bed with min A and was able to pull self up in bed using bed features. Pt was left resting in bed with bed alarm on, call bell in reach, and all needs met.   Therapy Documentation Precautions:   Precautions Precautions: Fall Precaution Comments: R drain Restrictions Weight Bearing Restrictions: No General:   Vital Signs: Therapy Vitals Temp: 98.1 F (36.7 C) Temp Source: Oral Pulse Rate: 92 Resp: 18 BP: 131/83 Patient Position (if appropriate): Sitting Oxygen Therapy SpO2: 98 % O2 Device: Room Air   ADL: ADL Eating: Minimal assistance Grooming: Minimal assistance, Maximal cueing Where Assessed-Grooming: Edge of bed Upper Body Bathing: Maximal cueing, Supervision/safety Where Assessed-Upper Body Bathing: Edge of bed Lower Body Bathing: Maximal cueing, Moderate assistance Where Assessed-Lower Body Bathing: Edge of bed Upper Body Dressing: Maximal cueing, Supervision/safety Where Assessed-Upper Body Dressing: Edge of bed Lower Body Dressing: Maximal cueing, Maximal assistance Where Assessed-Lower Body Dressing: Edge of bed (use of stedy) Toileting: Other (Comment) (simulated activity with BSC over toilet and use of stedy) Where Assessed-Toileting: Glass blower/designer: Moderate assistance (simulated activity with BSC over toilet and use of stedy) Toilet Transfer Method: Other (comment) (stedy) Toilet Transfer Equipment: Bedside commode   Therapy/Group: Individual Therapy  Janey Genta 11/04/2021, 3:40 PM

## 2021-11-05 LAB — GLUCOSE, CAPILLARY
Glucose-Capillary: 123 mg/dL — ABNORMAL HIGH (ref 70–99)
Glucose-Capillary: 190 mg/dL — ABNORMAL HIGH (ref 70–99)
Glucose-Capillary: 155 mg/dL — ABNORMAL HIGH (ref 70–99)
Glucose-Capillary: 192 mg/dL — ABNORMAL HIGH (ref 70–99)

## 2021-11-05 MED ORDER — BACITRACIN ZINC 500 UNIT/GM EX OINT
TOPICAL_OINTMENT | Freq: Two times a day (BID) | CUTANEOUS | Status: DC
  Filled 2021-11-05: qty 28.4

## 2021-11-05 MED ORDER — GLIPIZIDE 2.5 MG HALF TABLET
2.5000 mg | ORAL_TABLET | Freq: Every day | ORAL | Status: DC
  Administered 2021-11-06: 2.5 mg via ORAL
  Filled 2021-11-05 (×2): qty 1

## 2021-11-05 MED ORDER — FAMOTIDINE 20 MG PO TABS
20.0000 mg | ORAL_TABLET | Freq: Two times a day (BID) | ORAL | Status: DC
  Administered 2021-11-05 – 2021-11-06 (×3): 20 mg via ORAL
  Filled 2021-11-05 (×3): qty 1

## 2021-11-05 NOTE — Progress Notes (Signed)
PROGRESS NOTE   Subjective/Complaints:  No issues overnite   ROS: Denies fevers, chills, N/V, abdominal pain, constipation, diarrhea, SOB, cough, chest pain, new weakness or paraesthesias.    Objective:   No results found. Recent Labs    11/03/21 0511  WBC 6.6  HGB 9.5*  HCT 29.9*  PLT 227    Recent Labs    11/03/21 0511  NA 141  K 3.8  CL 106  CO2 25  GLUCOSE 141*  BUN 7  CREATININE 0.70  CALCIUM 8.6*     Intake/Output Summary (Last 24 hours) at 11/05/2021 0752 Last data filed at 11/05/2021 0700 Gross per 24 hour  Intake 959 ml  Output 990 ml  Net -31 ml      Pressure Injury 10/06/21 Sacrum Medial Stage 2 -  Partial thickness loss of dermis presenting as a shallow open injury with a red, pink wound bed without slough. (Active)  10/06/21 2000  Location: Sacrum  Location Orientation: Medial  Staging: Stage 2 -  Partial thickness loss of dermis presenting as a shallow open injury with a red, pink wound bed without slough.  Wound Description (Comments):   Present on Admission: Yes    Physical Exam: Vital Signs Blood pressure (!) 142/83, pulse 89, temperature 97.8 F (36.6 C), temperature source Oral, resp. rate 18, height 6' (1.829 m), weight 120.3 kg, SpO2 98 %.   General: No acute distress Mood and affect are appropriate Heart: Regular rate and rhythm no rubs murmurs or extra sounds Lungs: Clear to auscultation, breathing unlabored, no rales or wheezes Abdomen: Positive bowel sounds, soft nontender to palpation, nondistended + RLQ drain with minimal output, drain site appears c/d/I, sutured in place.  Extremities: No clubbing, cyanosis, or edema GU- foley in place draining clear yellow urine  Skin: No evidence of breakdown, no evidence of rash, +sacral heart.  Neurologic: Cranial nerves II through XII intact, motor strength is 4/5 in bilateral deltoid, bicep, tricep, grip, hip flexor, knee  extensors, ankle dorsiflexor and plantar flexor Sensory exam normal sensation to light touch and proprioception in bilateral upper and lower extremities Cerebellar exam normal finger to nose to finger as well as heel to shin in bilateral upper and lower extremities Musculoskeletal: Full range of motion in all 4 extremities. No joint swelling   Assessment/Plan: 1. Functional deficits which require 3+ hours per day of interdisciplinary therapy in a comprehensive inpatient rehab setting. Physiatrist is providing close team supervision and 24 hour management of active medical problems listed below. Physiatrist and rehab team continue to assess barriers to discharge/monitor patient progress toward functional and medical goals  Care Tool:  Bathing    Body parts bathed by patient: Chest, Abdomen, Face, Left arm, Right arm   Body parts bathed by helper: Front perineal area, Buttocks, Right upper leg, Left upper leg, Right lower leg, Left lower leg     Bathing assist Assist Level: Maximal Assistance - Patient 24 - 49% (max cues)     Upper Body Dressing/Undressing Upper body dressing   What is the patient wearing?: Pull over shirt    Upper body assist Assist Level: Maximal Assistance - Patient 25 - 49%  Lower Body Dressing/Undressing Lower body dressing      What is the patient wearing?: Incontinence brief, Pants     Lower body assist Assist for lower body dressing: Total Assistance - Patient < 25%     Toileting Toileting Toileting Activity did not occur Landscape architect and hygiene only): N/A (no void or bm)  Toileting assist Assist for toileting: Dependent - Patient 0%     Transfers Chair/bed transfer  Transfers assist     Chair/bed transfer assist level: Moderate Assistance - Patient 50 - 74%     Locomotion Ambulation   Ambulation assist      Assist level: Moderate Assistance - Patient 50 - 74% Assistive device: Walker-rolling Max distance: 45   Walk  10 feet activity   Assist     Assist level: Moderate Assistance - Patient - 50 - 74% Assistive device: Walker-rolling   Walk 50 feet activity   Assist Walk 50 feet with 2 turns activity did not occur: Safety/medical concerns         Walk 150 feet activity   Assist Walk 150 feet activity did not occur: Safety/medical concerns         Walk 10 feet on uneven surface  activity   Assist           Wheelchair     Assist Is the patient using a wheelchair?: Yes Type of Wheelchair: Manual    Wheelchair assist level: Minimal Assistance - Patient > 75% Max wheelchair distance: 50    Wheelchair 50 feet with 2 turns activity    Assist        Assist Level: Minimal Assistance - Patient > 75%   Wheelchair 150 feet activity     Assist      Assist Level: Moderate Assistance - Patient 50 - 74%   Blood pressure (!) 142/83, pulse 89, temperature 97.8 F (36.6 C), temperature source Oral, resp. rate 18, height 6' (1.829 m), weight 120.3 kg, SpO2 98 %.  Medical Problem List and Plan: 1. 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 acute respiratory failure, and cholecystitis.             -patient may  shower- cover drain             -ELOS/Goals: 2.5-3.5 weeks, Team conference today please see physician documentation under team conference tab, met with team  to discuss problems,progress, and goals. Formulized individual treatment plan based on medical history, underlying problem and comorbidities.  2.  Antithrombotics: -DVT/anticoagulation:  Pharmaceutical: Lovenox, but given GIB and amb 95' in PT will d/c and add SCD             -antiplatelet therapy:Hx of CVA x 2 home Plavix and aspirin on hold due to GI bleed 3. Pain Management: Tylenol, Lidoderm patch as needed           - Drain site pain - drain appears c/d/I; at max  lidoderm patches, can consider cutting or moving over drain site   4. Mood/Behavior/Sleep: LCSW to evaluate and provide emotional support             -antipsychotic agents: n/a 5. Neuropsych/cognition: This patient is not capable of making decisions on his own behalf. 6. Skin/Wound Care: Routine skin checks             -sacral fissure with likely Candidiasis. Apply Nystatin powder.             -  continue butt cream to anal area- skin without breakdown in sacro coccygeal area 7. Fluids/Electrolytes/Nutrition: Routine Is and Os and follow-up chemistries             - 10/30: labs stable  8: Cholecystis s/p cholecystostomy tube; exchanged on 10/24/21             -surgical follow-up at discharge 9: DM: carb mod diet; CBGs q AC and q HS             -continue Semglee 15 units BID             -continue SSI q AC and q HS             -home Glucotrol XL 10 mg and Tresiba 20 units BID not restarted Recent Labs    11/04/21 1628 11/04/21 2102 11/05/21 0610  GLUCAP 205* 183* 123*   Controlled on Semglee , daytime elevation will add low dose glucotrol in am     10: Hypertension: monitor; home amlodipine 10 mg and lisinopril 40 mg not restarted             -on doxazosin 2 mg daily for urinary retention Vitals:   11/04/21 1920 11/05/21 0428  BP: 130/74 (!) 142/83  Pulse: 94 89  Resp: 18 18  Temp: 98.8 F (37.1 C) 97.8 F (36.6 C)  SpO2: 96% 98%     11: Urinary retention with Foley re-inserted 10/10             -continue doxazosin, no need for flomax Wait for mobility to improve prior to voiding trial   12: Hyperlipidemia: continue Crestor 13: GIB/GI prophylaxis: s/p 19-20 units pRBCs- continue Protonix but increase to BID             - monitor H and H             -follow-up with GI as outpatient    Latest Ref Rng & Units 11/03/2021    5:11 AM 11/01/2021    6:43 PM 10/31/2021    5:42 AM  CBC  WBC 4.0 - 10.5 K/uL 6.6  9.3  12.7   Hemoglobin 13.0 - 17.0 g/dL 9.5  10.6  10.0    Hematocrit 39.0 - 52.0 % 29.9  33.6  33.2   Platelets 150 - 400 K/uL 227  230  272     14: Chronic colitis by biopsy; unknown etiology- negative C diff              -will check cbc monitor stools , FOB likely persistently + given recent massive GI bleed, no reason to recheck               - 10/30: HgB stable, no leukocytosis, ragged stool documented 1x overnight; monitor  15: Acute respiratory failure s/p trach; decannulated 10/19 16. Probable MS-s/p high dose steroids and Plasma exchange-  17. Encephalopathy- impaired cognition- monitor and might benefit from meds/intervention 18. Low back pain and B/L shoulder pain- will change lidoderm patches to 3 8pm to 8am and con't tylenol- Goes to [facility] for injections- avoid opiates due to sedation/encephalopathy  19. Impaired intake/Dysphagia- on D3 nectart thick liquids- encouraged to drink 6-8 cups/day- I.e more nectar thick liquids.              LOS: 6 days A FACE TO FACE EVALUATION WAS PERFORMED  Charlett Blake 11/05/2021, 7:52 AM

## 2021-11-05 NOTE — Progress Notes (Signed)
Occupational Therapy Session Note  Patient Details  Name: Troy Randall MRN: [id number] Date of Birth: 1960-06-23  Today's Date: 11/05/2021 OT Individual Time: 0800-0900 OT Individual Time Calculation (min): 60 min OT Individual Time: 1300-1345 OT Individual Time Calculation (min): 45 min    Short Term Goals: Week 1:  OT Short Term Goal 1 (Week 1): Pt will demonstrate improved awareness of LUE by washing his L arm with no more than 2 cues to fully attend to it. OT Short Term Goal 2 (Week 1): Pt will demonstrate improved motor planning to don shirt with min A and mod cues or less. OT Short Term Goal 3 (Week 1): Pt will be able to stand in stedy and actively pull pants down to prep for toileting. OT Short Term Goal 4 (Week 1): Pt will be able to stand in stedy and actively pull pants up post toileting with min A. OT Short Term Goal 5 (Week 1): Pt wil self feed with supervision.  Skilled Therapeutic Interventions/Progress Updates:     Session 1: Pt received in room resting in bed following breakfast. Pt reported pain on his bottom during beginning of session- Pt educated on weight shifting and position changes to avoid intense pressure on wound. Pt supine>sitting EOB min A with HOB elevated. Pt donned shirt sitting EOB with set-up A. Pt donned pants with mod A to weave feet d/t catheter management. Pt sit>stand to pull pants to his wast with min A for balance. Pt ambulated to wc with RW with mod A for managing RW. Pt brushed his teeth sitting at sink with min A and gestural cues to initiate turning on sink and applying tooth paste. Pt was transported to therapy gym where he participated in therapeutic activities focused on functional use of his LUE and increasing awareness of his L side.  Pt participated in bean bag matching activity picking up bean bags placed on his left side and visually scanning to find circle of matching color. Pt required moderate gestural cues to initiate picking up bean  bags and moderate verbal cues to recall two step directions during task. Pt completed 5 sit to stands with min A using RW during tasks. Pt was transported back to his room and left with his wife in his wc with seat belt alarm on, call bell in reach, and all needs met.  Session 2: Pt received resting in bed with wife present in good spirits and willing to participate in therapy session. Catheter was emptied prior to session (yellow/orange urine 150 cc). Pt transported to therapy gym to participate in therapeutic tasks focused on visual scanning, increasing awareness of L side, and functional mobility. Pt wc>EOM mod A with RW d/t significant R lean and RW management. Pt completed sit>stands x6 to hook horseshoes on top of vertical mirror. Pt required moderate multimodal cues to turn his head to the left to find horse shoes and to hook them onto the mirror.  Pt then completed visual scanning horse shoes scavenger hunt to find horse shoes placed around the room. Pt ambulated around therapy gym with RW mod A to manage RW. Pt required multiple rest breaks during task d/t decreased activity tolerance. Pt required moderate multimodal cues to scan area for horseshoes.  After finding horse shoes, Pt participated in game of horse shoes with OT. Pt and OT took turns throwing horse shoes with Pt standing with min A with his RW. PT attended to entire task with moderate gestural cues to initiate throwing horse  shoe. Pt required increased time to follow verbal direction during session and demonstrated increase success during tasks when provided with tactile and gestural cues. Pt was transported back to his room in his wc. Pt wc>supine in bed min A with moderate verbal cues to pull self up in bed. Pt was left resting in his bed with call bell in reach, bed alarm on, and all needs met.   Therapy Documentation Precautions:  Precautions Precautions: Fall Precaution Comments: R drain Restrictions Weight Bearing Restrictions:  No   Pain: Pain Assessment Pain Scale: Faces Faces Pain Scale: Hurts even more Pain Type: Acute pain Pain Location: Back Pain Intervention(s): Medication (See eMAR) ADL: ADL Eating: Minimal assistance Grooming: Minimal assistance, Maximal cueing Where Assessed-Grooming: Edge of bed Upper Body Bathing: Maximal cueing, Supervision/safety Where Assessed-Upper Body Bathing: Edge of bed Lower Body Bathing: Maximal cueing, Moderate assistance Where Assessed-Lower Body Bathing: Edge of bed Upper Body Dressing: Maximal cueing, Supervision/safety Where Assessed-Upper Body Dressing: Edge of bed Lower Body Dressing: Maximal cueing, Maximal assistance Where Assessed-Lower Body Dressing: Edge of bed (use of stedy) Toileting: Other (Comment) (simulated activity with BSC over toilet and use of stedy) Where Assessed-Toileting: Glass blower/designer: Moderate assistance (simulated activity with BSC over toilet and use of stedy) Toilet Transfer Method: Other (comment) (stedy) Toilet Transfer Equipment: Bedside commode   Therapy/Group: Individual Therapy  Janey Genta 11/05/2021, 8:50 AM

## 2021-11-05 NOTE — Progress Notes (Signed)
Physical Therapy Session Note  Patient Details  Name: Troy Randall MRN: [id number] Date of Birth: 10/13/1960  Today's Date: 11/05/2021 PT Individual Time: 1648-1730 PT Individual Time Calculation (min): 42 min   Short Term Goals: Week 1:  PT Short Term Goal 1 (Week 1): Pt will perform bed mobility with mod assist and mod cues for initiation PT Short Term Goal 2 (Week 1): Pt will ambulate 75ft with mod assist of 1 and RW PT Short Term Goal 3 (Week 1): Pt will tolerate being out of bed >1 hour between therapies PT Short Term Goal 4 (Week 1): Pt will attend to task for at least 2 minutes with only mod assist  Skilled Therapeutic Interventions/Progress Updates:     Pt greeted supine in bed and agreeable to therapy. No c/o pain currently, but had pain at drainage site earlier with nursing care.   Supine to sit with mod assistance for trunk management and pt using B UE support on HR.  Pt able to sit EOB statically and dynamically with close SPV.  Total assist with donning shoes for time management.  Stand pivot transfer to the L from EOB to WC using RW and min assist. Pt had difficulty with RW management and SPT heavily assisting with turning RW and giving cues before pt let go and performed pivot and stand to sit without RW.   Transported pt to main therapy gym via WC for time management.   Gait training:  -130ft with 2 helpers, "3 musketeers" with min assist with two turns. Decreased scissoring gait during straight amb compared to previous visits, but heavy scissoring with turns.  -70ft with 2 helpers, "3 musketeers" with min assist.  -10ft back stepping with 2 helpers, "3 musketeers" with min assist. Heavy cuing needed for foot placement and pt tripping over feet at times due to scissoring pattern.  -Attempted side stepping with 2 helpers, "3 musketeers" with min assist for balance. But max verbal, visual, and tactile cuing to abd hip, but pt unable to carry over. Manual facilitation of  hip abd to the right and left for 4 steps.   Attempted to perform step taps onto 4 inch step with colored circles as targets. Pt able to complete one tap with his R LE before stating he felt like he was going to "pass out". Pt seated quickly and vitals taken: BP 117/81 and HR 113bpm. Within a minute of sitting pt states he felt better.   Stand pivot transfer to the L from EOM to WC with no AD and min assist for balance.   Transported pt back to room in WC for time management.   Stand pivot transfer to the right from WC to EOB using no AD and min assist.   Sit>R side lying with SPV and total assist with scooting to middle of the bed.   Pt left R SL in bed with brother present, bed alarm on, call bell in reach, all needs met.   Therapy Documentation Precautions:  Precautions Precautions: Fall Precaution Comments: R drain Restrictions Weight Bearing Restrictions: No  Therapy/Group: Individual Therapy   , SPT 11/05/2021, 7:56 AM  

## 2021-11-05 NOTE — Progress Notes (Signed)
RN flushed drain to gallbladder noticed the flush was draining on the port site when flushed with saline. Noted redness and suture attached was loose . Patient complained of pain when being flushed. We will make PA aware. Dressing done.

## 2021-11-05 NOTE — Progress Notes (Signed)
Patient ID: Troy Randall, male   DOB: [birth year], 61 y.o.   MRN: [id number] Team Conference Report to Patient/Family  Team Conference discussion was reviewed with the patient and caregiver, including goals, any changes in plan of care and target discharge date.  Patient and caregiver express understanding and are in agreement.  The patient has a target discharge date of 11/14/21.  Sw met with patient and spouse in room to provide conference updates. Patient doing well and progressing well. Sw informed family of recommendation for [facility]. No additional questions or concerns.  Dyanne Iha 11/05/2021, 1:26 PM

## 2021-11-05 NOTE — Plan of Care (Signed)
Goals downgraded due to shorter than anticipated LOS established and slow gains    Problem: RH Cognition - SLP Goal: RH LTG Patient will demonstrate orientation with cues Description:  LTG:  Patient will demonstrate orientation to person/place/time/situation with cues (SLP)   Flowsheets (Taken 11/05/2021 1228) LTG: Patient will demonstrate orientation using cueing (SLP): Moderate Assistance - Patient 50 - 74%   Problem: RH Comprehension Communication Goal: LTG Patient will comprehend basic/complex auditory (SLP) Description: LTG: Patient will comprehend basic/complex auditory information with cues (SLP). Flowsheets (Taken 11/05/2021 1228) LTG: Patient will comprehend auditory information with cueing (SLP): Moderate Assistance - Patient 50 - 74%   Problem: RH Problem Solving Goal: LTG Patient will demonstrate problem solving for (SLP) Description: LTG:  Patient will demonstrate problem solving for basic/complex daily situations with cues  (SLP) Flowsheets (Taken 11/05/2021 1228) LTG Patient will demonstrate problem solving for:  Moderate Assistance - Patient 50 - 74%  Maximal Assistance - Patient 25 - 49%   Problem: RH Attention Goal: LTG Patient will demonstrate this level of attention during functional activites (SLP) Description: LTG:  Patient will will demonstrate this level of attention during functional activites (SLP) Flowsheets (Taken 11/05/2021 1228) LTG: Patient will demonstrate this level of attention during cognitive/linguistic activities with assistance of (SLP): Moderate Assistance - Patient 50 - 74% Number of minutes patient will demonstrate attention during cognitive/linguistic activities: 5   Problem: RH Awareness Goal: LTG: Patient will demonstrate awareness during functional activites type of (SLP) Description: LTG: Patient will demonstrate awareness during functional activites type of (SLP) Flowsheets (Taken 11/05/2021 1228) Patient will demonstrate during  cognitive/linguistic activities awareness type of: Emergent LTG: Patient will demonstrate awareness during cognitive/linguistic activities with assistance of (SLP): Moderate Assistance - Patient 50 - 74%

## 2021-11-05 NOTE — Progress Notes (Signed)
Speech Language Pathology Weekly Progress and Session Note  Patient Details  Name: Troy Randall MRN: [id number] Date of Birth: 05-Feb-1960  Beginning of progress report period: October 31, 2021 End of progress report period: November 05, 2021  Today's Date: 11/05/2021 SLP Individual Time: 0900-1003 SLP Individual Time Calculation (min): 63 min  Short Term Goals: Week 1: SLP Short Term Goal 1 (Week 1): Pt will orient x4 with max A verbal/visual cues with use of external aids SLP Short Term Goal 1 - Progress (Week 1): Met SLP Short Term Goal 2 (Week 1): Pt will sustain attention to functional tasks for 3 minute intervals with max A verbal/visual redirection cues SLP Short Term Goal 2 - Progress (Week 1): Met SLP Short Term Goal 3 (Week 1): Patient will complete basic, famliar tasks with max A verbal/visual cues for problem solving skills SLP Short Term Goal 3 - Progress (Week 1): Met SLP Short Term Goal 4 (Week 1): Patient will follow 1-step commands with max A verbal/visual cues to achieve 50% accuracy SLP Short Term Goal 4 - Progress (Week 1): Met SLP Short Term Goal 5 (Week 1): Patient will consume current diet with minimal overt s/sx of aspiration with mod A verbal cues for safe swallowing compensatory strategies SLP Short Term Goal 5 - Progress (Week 1): Met    New Short Term Goals: Week 2: SLP Short Term Goal 1 (Week 2): STG=LTG due to ELOS  Weekly Progress Updates: Pt has demonstrated functional gains, as evident by meeting 5 out of 5 short-term goals this reporting period. Pt continues to be limited by confusion, disorientation, fatigue, ongoing discomfort, internal/external distractibility, and severity of deficits. Pt is currently completing basic level cognitive-linguistic tasks with max A in regards to following basic commands, sustained attention, orientation, basic problem solving, and awareness of deficits. Pt is currently consuming a dysphagia 3 diet and thin liquids  (advanced on 10/28) with sup A verbal cues for swallowing safety.  Pt and family education ongoing re: diet recommendations, supportive cognitive-communication interventions, and ST POC. Continue to recommend ST intervention during CIR admission, as well as ST f/u upon d/c. Recommend 24/7 supervision and assistance at time of discharge.     Intensity: Minumum of 1-2 x/day, 30 to 90 minutes Frequency: 3 to 5 out of 7 days Duration/Length of Stay: 11/10 Treatment/Interventions: Cognitive remediation/compensation;Environmental controls;Internal/external aids;Speech/Language facilitation;Functional tasks;Dysphagia/aspiration precaution training;Patient/family education;Therapeutic Activities  Daily Session Skilled Therapeutic Interventions: Skilled ST treatment focused on cognitive goals. Pt seen in low stim treatment space this date. SLP facilitated session by providing max A verbal/visual cues for orientation using external aids; max A verbal/visual cues for identifying month, day of week, and year on calendar; max A verbal/visual cues for basic sequencing and organization task involving sequencing numbers 1-5, 6-10, 11-15, 16-20, 21-26, 26-30 on large wall calendar with manipulatives (numbers); max A verbal cues for following 1-step instructions; and mod-to-max A verbal/visual redirection cues for sustained attention to tasks for 2-3 minute intervals. Pt was highly restless and complained of buttocks discomfort from sitting in wheelchair - repositioning in chair was effective for brief periods. Pt remains significantly internally and externally distractible. Pt also known to exhibit emotional lability throughout session. Pt transferred from wheelchair to bed with max A verbal cues to follow 1-step commands and extended time to process. SLP educated spouse beneficial techniques for cognitive-communication to maximize attention and processing. Patient was left in bed with alarm activated and immediate needs  within reach at end of session. Continue per current  plan of care.    Therapy/Group: Individual Therapy  Patty Sermons 11/05/2021, 12:57 PM

## 2021-11-05 NOTE — Plan of Care (Signed)
Wound Plan   Braden Score: 15  Sensory: 3  Moisture: 3  Activity: 2  Mobility: 3  Nutrition: 3  Friction: 1   Wounds present: see previous, pics in epic  Interventions: continue with current plan of care

## 2021-11-05 NOTE — Progress Notes (Signed)
Physical Therapy Session Note  Patient Details  Name: Troy Randall MRN: [id number] Date of Birth: [birth year]  Today's Date: 11/05/2021 PT Individual Time:  -      Short Term Goals: Week 1:  PT Short Term Goal 1 (Week 1): Pt will perform bed mobility with mod assist and mod cues for initiation PT Short Term Goal 2 (Week 1): Pt will ambulate 24ft with mod assist of 1 and RW PT Short Term Goal 3 (Week 1): Pt will tolerate being out of bed >1 hour between therapies PT Short Term Goal 4 (Week 1): Pt will attend to task for at least 2 minutes with only mod assist Week 2:    Week 3:     Skilled Therapeutic Interventions/Progress Updates:      Therapy Documentation Precautions:  Precautions Precautions: Fall Precaution Comments: R drain Restrictions Weight Bearing Restrictions: No General:   Vital Signs: Therapy Vitals Temp: 97.6 F (36.4 C) Temp Source: Oral Pulse Rate: (!) 102 Resp: 19 BP: 104/72 Patient Position (if appropriate): Lying Oxygen Therapy SpO2: 100 % O2 Device: Room Air Pain: Pain Assessment Pain Scale: 0-10 Pain Score: 3  Pain Type: Acute pain Pain Location: Back Pain Orientation: Lower Pain Descriptors / Indicators: Aching Pain Onset: On-going Pain Intervention(s): Medication (See eMAR) Mobility:   Locomotion :    Trunk/Postural Assessment :    Balance:   Exercises:   Other Treatments:      Therapy/Group: Individual Therapy  Lorie Phenix 11/05/2021, 4:19 PM

## 2021-11-06 ENCOUNTER — Inpatient Hospital Stay (HOSPITAL_COMMUNITY): Payer: Medicare HMO

## 2021-11-06 ENCOUNTER — Inpatient Hospital Stay (HOSPITAL_COMMUNITY)
Admission: AD | Admit: 2021-11-06 | Discharge: 2021-11-12 | DRG: 872 | Disposition: A | Discharge status: Rehabilitation Facility | Payer: Medicare HMO | Source: Ambulatory Visit | Attending: Family Medicine | Admitting: Family Medicine

## 2021-11-06 DIAGNOSIS — E1165 Type 2 diabetes mellitus with hyperglycemia: Secondary | ICD-10-CM | POA: Diagnosis present

## 2021-11-06 DIAGNOSIS — Z8719 Personal history of other diseases of the digestive system: Secondary | ICD-10-CM

## 2021-11-06 DIAGNOSIS — D638 Anemia in other chronic diseases classified elsewhere: Secondary | ICD-10-CM | POA: Insufficient documentation

## 2021-11-06 DIAGNOSIS — R6520 Severe sepsis without septic shock: Secondary | ICD-10-CM | POA: Diagnosis not present

## 2021-11-06 DIAGNOSIS — I10 Essential (primary) hypertension: Secondary | ICD-10-CM | POA: Diagnosis not present

## 2021-11-06 DIAGNOSIS — R7401 Elevation of levels of liver transaminase levels: Secondary | ICD-10-CM | POA: Diagnosis present

## 2021-11-06 DIAGNOSIS — I471 Supraventricular tachycardia, unspecified: Secondary | ICD-10-CM | POA: Diagnosis present

## 2021-11-06 DIAGNOSIS — E876 Hypokalemia: Secondary | ICD-10-CM | POA: Diagnosis present

## 2021-11-06 DIAGNOSIS — E119 Type 2 diabetes mellitus without complications: Secondary | ICD-10-CM | POA: Diagnosis not present

## 2021-11-06 DIAGNOSIS — E8809 Other disorders of plasma-protein metabolism, not elsewhere classified: Secondary | ICD-10-CM | POA: Diagnosis present

## 2021-11-06 DIAGNOSIS — A419 Sepsis, unspecified organism: Secondary | ICD-10-CM

## 2021-11-06 DIAGNOSIS — R531 Weakness: Secondary | ICD-10-CM

## 2021-11-06 DIAGNOSIS — N390 Urinary tract infection, site not specified: Secondary | ICD-10-CM | POA: Diagnosis present

## 2021-11-06 DIAGNOSIS — N179 Acute kidney failure, unspecified: Secondary | ICD-10-CM | POA: Diagnosis not present

## 2021-11-06 DIAGNOSIS — E785 Hyperlipidemia, unspecified: Secondary | ICD-10-CM | POA: Diagnosis present

## 2021-11-06 DIAGNOSIS — G9340 Encephalopathy, unspecified: Secondary | ICD-10-CM | POA: Diagnosis not present

## 2021-11-06 DIAGNOSIS — Z880 Allergy status to penicillin: Secondary | ICD-10-CM

## 2021-11-06 DIAGNOSIS — Z6835 Body mass index (BMI) 35.0-35.9, adult: Secondary | ICD-10-CM

## 2021-11-06 DIAGNOSIS — F17220 Nicotine dependence, chewing tobacco, uncomplicated: Secondary | ICD-10-CM | POA: Diagnosis present

## 2021-11-06 DIAGNOSIS — Z794 Long term (current) use of insulin: Secondary | ICD-10-CM | POA: Diagnosis not present

## 2021-11-06 DIAGNOSIS — G35 Multiple sclerosis: Secondary | ICD-10-CM | POA: Diagnosis present

## 2021-11-06 DIAGNOSIS — B961 Klebsiella pneumoniae [K. pneumoniae] as the cause of diseases classified elsewhere: Secondary | ICD-10-CM | POA: Diagnosis present

## 2021-11-06 DIAGNOSIS — G610 Guillain-Barre syndrome: Secondary | ICD-10-CM | POA: Diagnosis present

## 2021-11-06 DIAGNOSIS — Z833 Family history of diabetes mellitus: Secondary | ICD-10-CM

## 2021-11-06 DIAGNOSIS — A4150 Gram-negative sepsis, unspecified: Principal | ICD-10-CM | POA: Diagnosis present

## 2021-11-06 DIAGNOSIS — Z87442 Personal history of urinary calculi: Secondary | ICD-10-CM

## 2021-11-06 DIAGNOSIS — Z8673 Personal history of transient ischemic attack (TIA), and cerebral infarction without residual deficits: Secondary | ICD-10-CM

## 2021-11-06 DIAGNOSIS — E872 Acidosis, unspecified: Secondary | ICD-10-CM | POA: Diagnosis present

## 2021-11-06 DIAGNOSIS — Z888 Allergy status to other drugs, medicaments and biological substances status: Secondary | ICD-10-CM

## 2021-11-06 DIAGNOSIS — Z8249 Family history of ischemic heart disease and other diseases of the circulatory system: Secondary | ICD-10-CM

## 2021-11-06 DIAGNOSIS — B9620 Unspecified Escherichia coli [E. coli] as the cause of diseases classified elsewhere: Secondary | ICD-10-CM | POA: Diagnosis present

## 2021-11-06 DIAGNOSIS — E669 Obesity, unspecified: Secondary | ICD-10-CM | POA: Diagnosis present

## 2021-11-06 DIAGNOSIS — N3000 Acute cystitis without hematuria: Secondary | ICD-10-CM | POA: Diagnosis present

## 2021-11-06 DIAGNOSIS — Z7984 Long term (current) use of oral hypoglycemic drugs: Secondary | ICD-10-CM

## 2021-11-06 DIAGNOSIS — E66812 Obesity, class 2: Secondary | ICD-10-CM | POA: Diagnosis present

## 2021-11-06 DIAGNOSIS — G0400 Acute disseminated encephalitis and encephalomyelitis, unspecified: Secondary | ICD-10-CM | POA: Diagnosis present

## 2021-11-06 HISTORY — PX: IR CHOLANGIOGRAM EXISTING TUBE: IMG6040

## 2021-11-06 LAB — CBC WITH DIFFERENTIAL/PLATELET
Abs Immature Granulocytes: 0.05 10*3/uL (ref 0.00–0.07)
Basophils Absolute: 0 10*3/uL (ref 0.0–0.1)
Basophils Relative: 0 %
Eosinophils Absolute: 0 10*3/uL (ref 0.0–0.5)
Eosinophils Relative: 0 %
HCT: 31.5 % — ABNORMAL LOW (ref 39.0–52.0)
Hemoglobin: 9.8 g/dL — ABNORMAL LOW (ref 13.0–17.0)
Immature Granulocytes: 0 %
Lymphocytes Relative: 2 %
Lymphs Abs: 0.3 10*3/uL — ABNORMAL LOW (ref 0.7–4.0)
MCH: 27.5 pg (ref 26.0–34.0)
MCHC: 31.1 g/dL (ref 30.0–36.0)
MCV: 88.5 fL (ref 80.0–100.0)
Monocytes Absolute: 0.4 10*3/uL (ref 0.1–1.0)
Monocytes Relative: 3 %
Neutro Abs: 13.3 10*3/uL — ABNORMAL HIGH (ref 1.7–7.7)
Neutrophils Relative %: 95 %
Platelets: 215 10*3/uL (ref 150–400)
RBC: 3.56 MIL/uL — ABNORMAL LOW (ref 4.22–5.81)
RDW: 14.9 % (ref 11.5–15.5)
WBC: 14 10*3/uL — ABNORMAL HIGH (ref 4.0–10.5)
nRBC: 0 % (ref 0.0–0.2)

## 2021-11-06 LAB — URINALYSIS, ROUTINE W REFLEX MICROSCOPIC
Bilirubin Urine: NEGATIVE
Glucose, UA: NEGATIVE mg/dL
Hgb urine dipstick: NEGATIVE
Ketones, ur: NEGATIVE mg/dL
Nitrite: NEGATIVE
Protein, ur: 100 mg/dL — AB
Specific Gravity, Urine: 1.023 (ref 1.005–1.030)
WBC, UA: >50 WBC/hpf — ABNORMAL HIGH (ref 0–5)
pH: 5 (ref 5.0–8.0)

## 2021-11-06 LAB — GLUCOSE, CAPILLARY
Glucose-Capillary: 118 mg/dL — ABNORMAL HIGH (ref 70–99)
Glucose-Capillary: 165 mg/dL — ABNORMAL HIGH (ref 70–99)
Glucose-Capillary: 199 mg/dL — ABNORMAL HIGH (ref 70–99)
Glucose-Capillary: 178 mg/dL — ABNORMAL HIGH (ref 70–99)
Glucose-Capillary: 202 mg/dL — ABNORMAL HIGH (ref 70–99)

## 2021-11-06 LAB — COMPREHENSIVE METABOLIC PANEL
ALT: 535 U/L — ABNORMAL HIGH (ref 0–44)
AST: 1227 U/L — ABNORMAL HIGH (ref 15–41)
Albumin: 3 g/dL — ABNORMAL LOW (ref 3.5–5.0)
Alkaline Phosphatase: 408 U/L — ABNORMAL HIGH (ref 38–126)
Anion gap: 15 (ref 5–15)
BUN: 14 mg/dL (ref 6–20)
CO2: 23 mmol/L (ref 22–32)
Calcium: 9.2 mg/dL (ref 8.9–10.3)
Chloride: 102 mmol/L (ref 98–111)
Creatinine, Ser: 1.15 mg/dL (ref 0.61–1.24)
GFR, Estimated: >60 mL/min (ref >60)
Glucose, Bld: 250 mg/dL — ABNORMAL HIGH (ref 70–99)
Potassium: 3.9 mmol/L (ref 3.5–5.1)
Sodium: 140 mmol/L (ref 135–145)
Total Bilirubin: 1.7 mg/dL — ABNORMAL HIGH (ref 0.3–1.2)
Total Protein: 6.1 g/dL — ABNORMAL LOW (ref 6.5–8.1)

## 2021-11-06 LAB — MAGNESIUM: Magnesium: 1.5 mg/dL — ABNORMAL LOW (ref 1.7–2.4)

## 2021-11-06 LAB — LACTIC ACID, PLASMA: Lactic Acid, Venous: 3.2 mmol/L (ref 0.5–1.9)

## 2021-11-06 LAB — PROCALCITONIN: Procalcitonin: 1.41 ng/mL

## 2021-11-06 MED ORDER — METOPROLOL TARTRATE 5 MG/5ML IV SOLN
5.0000 mg | INTRAVENOUS | Status: DC | PRN
  Administered 2021-11-06: 5 mg via INTRAVENOUS
  Filled 2021-11-06: qty 5

## 2021-11-06 MED ORDER — LIDOCAINE 5 % EX PTCH
1.0000 | MEDICATED_PATCH | CUTANEOUS | Status: AC
  Administered 2021-11-06 – 2021-11-08 (×3): 1 via TRANSDERMAL
  Filled 2021-11-06 (×3): qty 1

## 2021-11-06 MED ORDER — PROCHLORPERAZINE EDISYLATE 10 MG/2ML IJ SOLN
5.0000 mg | Freq: Four times a day (QID) | INTRAMUSCULAR | Status: DC | PRN
  Filled 2021-11-06: qty 1

## 2021-11-06 MED ORDER — INSULIN ASPART 100 UNIT/ML IJ SOLN
0.0000 [IU] | Freq: Four times a day (QID) | INTRAMUSCULAR | Status: DC
  Administered 2021-11-06: 3 [IU] via SUBCUTANEOUS
  Administered 2021-11-07: 1 [IU] via SUBCUTANEOUS
  Administered 2021-11-07 – 2021-11-08 (×2): 2 [IU] via SUBCUTANEOUS
  Administered 2021-11-08: 1 [IU] via SUBCUTANEOUS
  Administered 2021-11-08: 2 [IU] via SUBCUTANEOUS
  Administered 2021-11-09: 3 [IU] via SUBCUTANEOUS
  Administered 2021-11-09: 1 [IU] via SUBCUTANEOUS

## 2021-11-06 MED ORDER — MAGNESIUM SULFATE 2 GM/50ML IV SOLN
2.0000 g | Freq: Once | INTRAVENOUS | Status: AC
  Administered 2021-11-06: 2 g via INTRAVENOUS
  Filled 2021-11-06: qty 50

## 2021-11-06 MED ORDER — SODIUM CHLORIDE 0.9 % IV SOLN
2.0000 g | INTRAVENOUS | Status: DC
  Administered 2021-11-07 – 2021-11-08 (×3): 2 g via INTRAVENOUS
  Filled 2021-11-06 (×3): qty 20

## 2021-11-06 MED ORDER — LACTATED RINGERS IV SOLN: INTRAVENOUS | Status: DC

## 2021-11-06 MED ORDER — METOPROLOL TARTRATE 5 MG/5ML IV SOLN: 5.0000 mg | INTRAVENOUS | Status: DC | PRN

## 2021-11-06 MED ORDER — ACETAMINOPHEN 650 MG RE SUPP
650.0000 mg | RECTAL | Status: DC | PRN
  Administered 2021-11-06: 650 mg via RECTAL
  Filled 2021-11-06: qty 1

## 2021-11-06 MED ORDER — PROCHLORPERAZINE EDISYLATE 10 MG/2ML IJ SOLN
10.0000 mg | Freq: Four times a day (QID) | INTRAMUSCULAR | Status: DC | PRN
  Administered 2021-11-06: 10 mg via INTRAVENOUS

## 2021-11-06 MED ORDER — IOHEXOL 300 MG/ML  SOLN: 50.0000 mL | Freq: Once | INTRAMUSCULAR | Status: DC | PRN

## 2021-11-06 MED ORDER — ACETAMINOPHEN 325 MG PO TABS
650.0000 mg | ORAL_TABLET | Freq: Four times a day (QID) | ORAL | Status: DC | PRN
  Administered 2021-11-08 – 2021-11-10 (×2): 650 mg via ORAL
  Filled 2021-11-06 (×2): qty 2

## 2021-11-06 MED ORDER — METRONIDAZOLE 500 MG/100ML IV SOLN
500.0000 mg | Freq: Two times a day (BID) | INTRAVENOUS | Status: DC
  Administered 2021-11-07: 500 mg via INTRAVENOUS
  Filled 2021-11-06: qty 100

## 2021-11-06 MED ORDER — ACETAMINOPHEN 650 MG RE SUPP
650.0000 mg | Freq: Four times a day (QID) | RECTAL | Status: DC | PRN
  Administered 2021-11-07: 650 mg via RECTAL
  Filled 2021-11-06: qty 1

## 2021-11-06 MED ORDER — METOPROLOL TARTRATE 5 MG/5ML IV SOLN: 2.5000 mg | Freq: Four times a day (QID) | INTRAVENOUS | Status: DC | PRN

## 2021-11-06 NOTE — H&P (Addendum)
History and Physical    PLEASE NOTE THAT DRAGON DICTATION SOFTWARE WAS USED IN THE CONSTRUCTION OF THIS NOTE.   Troy Randall:[id number] DOB: February 05, 1960 DOA: 11/06/2021  PCP: [name], MD  Patient coming from: inpatient rehab [facility])  I have personally briefly reviewed patient's old medical records in [place]  Chief Complaint: SVT  HPI: Troy Randall is a 61 y.o. male with medical history significant for lower gastrointestinal bleed, recent diagnosis of acute cholecystitis status postcholecystostomy tube on 09/07/21, type 2 diabetes mellitus, hypertension, hyperlipidemia, who is admitted to [facility] on 11/06/2021 from inpatient rehab at [facility] with new diagnosis of SVT.   I was contacted by Risa Grill, PA, of the inpatient rehab unit at [facility], requesting assistance with new diagnosis of SVT in this patient.   The patient was admitted to inpatient rehab from the hospitalist service 1 week ago after a prolonged hospitalization.  Initially, he had presented to [facility] emergency department on 09/04/2021 with finding of acute lower gastrointestinal bleed status post 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 on 09/06/2021.  There is subsequent concern for Guillain-Barr disease with some evidence of demyelination, prompting patient to be transferred to [facility] he was ultimately intubated, trached, and underwent treatment that included plasma exchange.  During this timeframe, he was also diagnosed with acute cholecystitis, status post percutaneous cholecystostomy drain via interventional radiology on 09/07/2021.  He completed a course of IV antibiotics, the specifics of which are unclear to me.  Subsequently, his cholecystostomy tube was exchanged by interventional radiology on 10/24/2021.  He was ultimately discharged from the hospital service to inpatient rehab at [facility] 1 week ago.  While in inpatient rehab, the patient has experienced  no melena, hematochezia, or hematemesis.  His Foley catheter was removed around 11 AM on 11/06/2021, it is noted that the patient has been unable to urinate since that time.   Interventional radiology saw the patient on 11-23 during the late afternoon hours, at which time cholangiogram showed appropriate placement/functionality of cholecystostomy drain.  Per chart review, most recent prior liver enzymes were checked on 10/31/2021, and were notable for the following: Alkaline phosphatase 125, AST 23, ALT 24, and total bilirubin 0.6.  He was subsequently noted to develop tachycardia, with heart rates into the 150s to 160s, with associated EKG at that time suggestive of SVT.  Vital signs at that time indicated new elevated temperature, measured to be 100 degrees via rectal temperature.  Systolic blood pressures in the low 100s to 130s.  On oxygen saturations in the low to mid 90s on room air. RR called as a consequence of SVT.  No reported history of previous SVT.  He is not currently on any AV nodal blocking agents.  Most recent echocardiogram occurred on 09/09/2021 and was notable for LVEF 65 to 70%, no focal wall motion normalities, indeterminate diastolic parameters, normal right ventricular systolic function, no evidence of significant valvular pathology.   Risa Grill,, PA, had ordered multiple laboratory studies prior to contacting the hospitalist service this evening, including CMP, CBC, lactic acid level, blood cultures x2.   I subsequently excepted the patient for admission to the hospitalist service, admitting to the PCU for further evaluation management of new diagnosis of SVT.  After excepting the patient for admission, the following laboratory/imaging studies resulted and were notable for the following:  CMP notable for the following: Potassium 3.9, bicarbonate 23, creatinine 1.15 compared to most  recent prior value of 0.70 on 11/03/2021, calcium, adjusted for mild hypoalbuminemia noted  to be 10.0, avidin 3.0, alkaline phosphatase 408, AST 1200, ALT 535, total bilirubin 1.7.  Serum magnesium level 1.5 compared to 1.9 on 10/31/2021.  CBC notable for white blood cell count 14,000 and associated 95% neutrophils, which is relative to most recent prior white blood cell count of 6610 3023, hemoglobin 9.8 associated normocytic/normochromic findings as well as nonelevated RDW, relative to hemoglobin value of 9.5 on 11/03/2021, and preceded by 10.6 on 10/24/2021.  Lactic acid 3.2.  Urinalysis was associated with a cloudy appearing specimen with greater than 50 white blood cells compared to 0-5 white blood cells 1 month ago, large leukocyte esterase, with leukocyte esterase noted to be -87-monthago, this was without cells, no RBCs, and specific gravity noted to be 1.023.   EKG showed SVT with heart rate 165, normal intervals, no evidence of T wave or ST changes, including no evidence of ST elevation.  Chest x-ray showed no evidence of acute cardiopulmonary process, including no evidence of infiltrate, edema, effusion, or pneumothorax.    Review of Systems: As per HPI otherwise 10 point review of systems negative.   Past Medical History:  Diagnosis Date   Anemia    Arthritis    [place]    Diabetes mellitus without complication [place]    Hyperlipidemia    Hypertension    Kidney stones     Past Surgical History:  Procedure Laterality Date   ANKLE SURGERY     BIOPSY  09/10/2021   Procedure: BIOPSY;  Surgeon: HCarol Ada MD;  [facility]  Service: Gastroenterology;;   BIOPSY  10/07/2021   Procedure: BIOPSY;  Surgeon: HCarol Ada MD;  [facility];  Service: Gastroenterology;;   COLONOSCOPY N/A 10/07/2021   Procedure: COLONOSCOPY;  Surgeon: HCarol Ada MD;  [place]  Service: Gastroenterology;  Laterality: N/A;   COLONOSCOPY WITH PROPOFOL N/A 09/06/2021   Procedure: COLONOSCOPY WITH PROPOFOL;  Surgeon: CHarvel Quale MD;  Location[facility];  Service: Gastroenterology;  Laterality: N/A;   ENTEROSCOPY N/A 09/10/2021   Procedure: ENTEROSCOPY;  Surgeon: HCarol Ada MD;  [place]  Service: Gastroenterology;  Laterality: N/A;   ENTEROSCOPY  09/06/2021   Procedure: ENTEROSCOPY;  Surgeon: CHarvel Quale MD;  Location[facility];  Service: Gastroenterology;;   ESOPHAGOGASTRODUODENOSCOPY (EGD) WITH PROPOFOL  09/06/2021   Procedure: ESOPHAGOGASTRODUODENOSCOPY (EGD) WITH PROPOFOL;  Surgeon: CHarvel Quale MD;  [facility];  Service: Gastroenterology;;   FPolkN/A 09/19/2021   Procedure: FBeryle Quant  Surgeon: HCarol Ada MD;  [place]  Service: Gastroenterology;  Laterality: N/A;   FOREIGN BODY REMOVAL  09/19/2021   Procedure: FOREIGN BODY REMOVAL;  Surgeon: HCarol Ada MD;  [facility];  Service: Gastroenterology;;   GFreda MunroCAPSULE STUDY  09/06/2021   Procedure: GIVENS CAPSULE STUDY;  Surgeon: CHarvel Quale MD;  [facility];  Service: Gastroenterology;;   HERNIA REPAIR     umbilical x1 Incisional x1   INCISIONAL HERNIA REPAIR  04/13/2011   Procedure: LAPAROSCOPIC INCISIONAL HERNIA;  Surgeon: MJamesetta So MD;  Location: [facility] ORS;  Service: General;  Laterality: N/A;  Recurrent Laparoscopic Incisional Herniorraphy with Mesh   IR ANGIOGRAM SELECTIVE EACH ADDITIONAL VESSEL  09/09/2021   IR ANGIOGRAM VISCERAL SELECTIVE  09/07/2021   IR ANGIOGRAM VISCERAL SELECTIVE  09/06/2021   IR ANGIOGRAM VISCERAL SELECTIVE  09/06/2021   IR  CHOLANGIOGRAM EXISTING TUBE  11/06/2021   IR EMBO ART  VEN HEMORR LYMPH EXTRAV  INC GUIDE ROADMAPPING  09/06/2021   IR EXCHANGE BILIARY DRAIN  10/24/2021   IR GUIDED DRAIN W CATHETER PLACEMENT  09/07/2021   IR US GUIDE BX ASP/DRAIN  09/07/2021   IR US GUIDE VASC ACCESS RIGHT  09/07/2021   IR US GUIDE VASC ACCESS RIGHT  09/06/2021   KIDNEY STONE SURGERY       Social History:  reports that he has never smoked. His smokeless tobacco use includes snuff. He reports that he does not drink alcohol and does not use drugs.   Allergies  Allergen Reactions   Metformin And Related     GI upset   Penicillins Hives    Family History  Problem Relation Age of Onset   Heart failure Mother    Diabetes Father    Cancer Other    Heart attack Other    Anesthesia problems Neg Hx    Hypotension Neg Hx    Malignant hyperthermia Neg Hx    Pseudochol deficiency Neg Hx    Colon cancer Neg Hx    Inflammatory bowel disease Neg Hx      Prior to Admission medications   Medication Sig Start Date End Date Taking? Authorizing Provider  BD VEO INSULIN SYRINGE U/F 31G X 15/64" 1 ML MISC  USE AS DIRECTED 06/08/17   Caren Macadam, MD  doxazosin (CARDURA) 2 MG tablet Take 1 tablet (2 mg total) by mouth daily. 10/30/21   Caren Griffins, MD  glipiZIDE (GLUCOTROL XL) 10 MG 24 hr tablet Take 1 tablet (10 mg total) by mouth daily with breakfast. 04/16/17   Caren Macadam, MD  glucose blood (ONETOUCH VERIO) test strip TEST twice a day 05/04/17   Caren Macadam, MD  Insulin Pen Needle (NOVOTWIST) 32G X 5 MM MISC Use two daily to inject Victoza and Toujeo. 04/25/15   Elayne Snare, MD  levocetirizine (XYZAL) 5 MG tablet Take 1 tablet by mouth at bedtime.    [provider]  metFORMIN (GLUCOPHAGE-XR) 500 MG 24 hr tablet Take 500-1,000 mg by mouth in the morning and at bedtime. 02/09/21   [provider]  Jonetta Speak LANCETS 76L MISC Use to check blood sugar once a day dx code E11.65 11/21/14   Elayne Snare, MD  pantoprazole (PROTONIX) 40 MG tablet Take 1 tablet (40 mg total) by mouth 2 (two) times daily before a meal. 10/30/21 10/30/22  Gherghe, Vella Redhead, MD  rosuvastatin (CRESTOR) 40 MG tablet Take 1 tablet (40 mg total) by mouth daily. 04/29/21   Little Ishikawa, MD  TRESIBA FLEXTOUCH 200 UNIT/ML FlexTouch Pen Inject 20 Units into the skin in the  morning and at bedtime. 03/14/21   [provider]     Objective    Physical Exam: Vitals:   11/06/21 2236  Resp: 20  TempSrc: Oral    General: appears to be stated age; alert Skin: warm, dry, no rash Head:  AT/[place] Mouth:  Oral mucosa membranes appear dry, normal dentition Neck: supple; trachea midline Heart:  mildly tachycardic; did not appreciate any M/R/G Lungs: CTAB, did not appreciate any wheezes, rales, or rhonchi Abdomen: + BS; soft, ND, NT; cholecystostomy drain noted Vascular: 2+ pedal pulses b/l; 2+ radial pulses b/l Extremities: no peripheral edema, no muscle wasting Neuro: strength and sensation intact in upper and lower extremities b/l     Labs on Admission: I have personally reviewed following labs and imaging studies  CBC: Recent Labs  Lab 10/31/21 0542 11/01/21 1843 11/03/21 0511 11/06/21 2032  WBC 12.7* 9.3 6.6 14.0*  NEUTROABS 10.0*  --   --  13.3*  HGB 10.0* 10.6* 9.5* 9.8*  HCT 33.2* 33.6* 29.9* 31.5*  MCV 90.0 89.4 88.2 88.5  PLT 272 230 227 127   Basic Metabolic Panel: Recent Labs  Lab 10/31/21 0542 11/03/21 0511 11/06/21 2032  NA 141 141 140  K 4.0 3.8 3.9  CL 106 106 102  CO2 _0 GLUCOSE 165* 141* 250*  BUN _1 CREATININE 0.73 0.70 1.15  CALCIUM 9.0 8.6* 9.2  MG 1.9  --  1.5*  PHOS 4.0  --   --    GFR: Estimated Creatinine Clearance: 92.6 mL/min (by C-G formula based on SCr of 1.15 mg/dL). Liver Function Tests: Recent Labs  Lab 10/31/21 0542 11/06/21 2032  AST 23 1,227*  ALT 24 535*  ALKPHOS 125 408*  BILITOT 0.6 1.7*  PROT 6.2* 6.1*  ALBUMIN 3.1* 3.0*   No results for input(s): "LIPASE", "AMYLASE" in the last 168 hours. No results for input(s): "AMMONIA" in the last 168 hours. Coagulation Profile: No results for input(s): "INR", "PROTIME" in the last 168 hours. Cardiac Enzymes: No results for input(s): "CKTOTAL", "CKMB", "CKMBINDEX", "TROPONINI" in the last 168 hours. BNP (last 3 results) No  results for input(s): "PROBNP" in the last 8760 hours. HbA1C: No results for input(s): "HGBA1C" in the last 72 hours. CBG: Recent Labs  Lab 11/05/21 2100 11/06/21 0640 11/06/21 1135 11/06/21 1659 11/06/21 1807  GLUCAP 192* 118* 165* 199* 178*   Lipid Profile: No results for input(s): "CHOL", "HDL", "LDLCALC", "TRIG", "CHOLHDL", "LDLDIRECT" in the last 72 hours. Thyroid Function Tests: No results for input(s): "TSH", "T4TOTAL", "FREET4", "T3FREE", "THYROIDAB" in the last 72 hours. Anemia Panel: No results for input(s): "VITAMINB12", "FOLATE", "FERRITIN", "TIBC", "IRON", "RETICCTPCT" in the last 72 hours. Urine analysis:    Component Value Date/Time   COLORURINE AMBER (A) 11/06/2021 2019   APPEARANCEUR CLOUDY (A) 11/06/2021 2019   LABSPEC 1.023 11/06/2021 2019   PHURINE 5.0 11/06/2021 2019   GLUCOSEU NEGATIVE 11/06/2021 2019   GLUCOSEU >=1000 (A) 12/12/2014 1555   HGBUR NEGATIVE 11/06/2021 2019   BILIRUBINUR NEGATIVE 11/06/2021 2019   KETONESUR NEGATIVE 11/06/2021 2019   PROTEINUR 100 (A) 11/06/2021 2019   UROBILINOGEN 0.2 12/12/2014 1555   NITRITE NEGATIVE 11/06/2021 2019   LEUKOCYTESUR LARGE (A) 11/06/2021 2019    Radiological Exams on Admission: DG Chest Port 1 View  Result Date: 11/06/2021 CLINICAL DATA:  Fever EXAM: PORTABLE CHEST 1 VIEW COMPARISON:  Chest x-ray 10/05/2021 FINDINGS: The heart size and mediastinal contours are within normal limits. Both lungs are clear. The visualized skeletal structures are unremarkable. IMPRESSION: No active disease. Electronically Signed   By: Ronney Asters M.D.   On: 11/06/2021 20:11   IR CHOLANGIOGRAM EXISTING TUBE  Result Date: 11/06/2021 INDICATION: History of acute cholecystitis, post ultrasound fluoroscopic guided cholecystostomy tube placement in 09/08/2021. Most recent cholecystostomy exchange in 10/24/2021. EXAM: CHOLECYSTOSTOMY TUBE INJECTION COMPARISON:  IR fluoroscopy, 09/08/2021 and 10/24/2021. CT [facility], 10/16/2021.  MEDICATIONS: None ANESTHESIA/SEDATION: None CONTRAST:  5 mL Omnipaque 300-administered into the gallbladder fossa. FLUOROSCOPY TIME:  Fluoroscopic dose; 4 mGy COMPLICATIONS: None immediate. PROCEDURE: The patient was positioned supine on the fluoroscopy table. A preprocedural spot fluoroscopic image was obtained of the RIGHT upper abdominal quadrant existing cholecystostomy tube. Multiple spot fluoroscopic radiographic images were obtained of the RIGHT upper abdominal quadrant an existing cholecystostomy tube  following injection of a small amount of contrast. Images were reviewed and discussed with the patient. The cholecystostomy tube was flushed with a small amount of saline and capped. A dressing was placed. The patient tolerated the procedure well without immediate postprocedural complication. FINDINGS: Preprocedural spot fluoroscopic image of the right upper abdominal quadrant demonstrates grossly unchanged positioning of the cholecystostomy tube with end coiled and locked overlying the expected location of the gallbladder fundus. Subsequent contrast injection demonstrates appropriate functionality of the cholecystostomy tube with brisk opacification of the gallbladder. Persistent occlusion of the cystic duct, without passage of contrast from the gallbladder and into the common bile duct. IMPRESSION: 1. Appropriately positioned and functioning cholecystostomy. No cholecystostomy drain exchange was performed. 2. No fluoroscopic evidence of cystic duct patency. Attention on follow-up. PLAN: The patient will return to Vascular Interventional Radiology (VIR) for routine drainage catheter evaluation and exchange in 8 weeks from most recent exchange. Michaelle Birks, MD [facility] Radiology Electronically Signed   By: Michaelle Birks M.D.   On: 11/06/2021 17:16     EKG: Independently reviewed, with result as described above.    Assessment/Plan   Principal Problem:    SVT (supraventricular tachycardia) Active Problems:   DM2 (diabetes mellitus, type 2) (HCC)   Essential hypertension   Hyperlipidemia   AKI (acute kidney injury) ([place])   History of lower GI bleeding: 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    History of acute cholecystitis   Severe sepsis (HCC)   Acute cystitis   Hypomagnesemia   Generalized weakness      #) Supraventricular tachycardia: Went into SVT during the late afternoon of 11-23, which appears to represent a new diagnosis for the patient.  Tolerated these heart rates from a blood pressure standpoint, and ultimately asymptomatic from the SVT itself.  Suspect that this was multifactorial in etiology, with multiple physiologic stressors stemming from finding of severe sepsis due to urinary tract infection, as well as concern for worsening acute cholecystitis given interval transaminitis in spite of appearance of appropriately functioning cholecystostomy tube as evaluated by interventional radiology earlier today.  Additional potential contributing factors include the appearance of dehydration as well as hypomagnesemia.  Potential pharmacologic contribution from doxazosin, which can be associated with reflex tachycardia.  With ensuing initiation of antibiotics, IV fluids, appears to be routed to sinus tachycardia, but warrants further evaluation in the setting of apparent new diagnosis of SVT, along with focus on treating underlying contributing factors.  Of note, initial EKG showed no evidence of ST elevation.  As this SVT appears to represent a new diagnosis, will pursue updated echocardiogram in the morning.  Of note, no evidence of contribution from acute blood loss, notable given recent hospitalization for acute lower gastrointestinal bleed.  Plan: Monitor on telemetry.  As needed IV Lopressor for sustained heart rate greater than 130.  Echocardiogram in the morning.  IV magnesium supplementation, as further detailed below.   Repeat serum magnesium level as well as CMP in the morning.  Further evaluation management of severe sepsis due to urinary tract infection, as well as potential worsening of acute cholecystitis, as further detailed below, including reinitiation of IV antibiotics.  Gentle IV fluids.  Monitor strict I's and O's and daily weights.  Check TSH.  Hold home doxazosin.  Repeat EKG given interval improvement in rate control.  CBC in the morning.          #) Hypomagnesemia: Today's serum magnesium level found to  be 1.5, which is notable in the context of new diagnosis of SVT, as above.  Plan: Magnesium sulfate 2 g IV over 2 hours x 1 dose now.  Monitor on symmetry.  Repeat serum magnesium level in the morning.            #) Acute Kidney Injury:  as quantified above.  Appears prerenal in nature, with clinical evidence of intravascular depletion, with likely contribution from severe sepsis due to UTI as well as concern for worsening acute cholecystitis.  Today's urinalysis with microscopy, aside from significant pyuria showed no additional significant urinary casts, pitting no red blood cells, but was associated with elevated specific gravity, consistent with a picture of dehydration.  There may also be a postrenal element given report of difficulty with urination following removal of Foley catheter earlier today, as further detailed above.   Plan: monitor strict I's & O's and daily weights. Attempt to avoid nephrotoxic agents. Refrain from NSAIDs. Repeat CMP in the morning. Check serum magnesium level.  Lactated Ringer's at 125 cc/h.  Further evaluation and management of severe sepsis, as further detailed below. Consider scheduled postvoid residual bladder scans.              #) Severe sepsis: SIRS criteria met via new onset leukocytosis, with neutrophilic predominance, tachycardia, tachypnea mildly elevated temperature. Lactic acid level:  3.2.  This is potentially multifactorial in  terms of underlying infectious source, with evidence of urinary tract infection in the form of today's urinalysis showing a cloudy specimen associated with significant pyuria, large leukocyte Estrace, with which her presenting findings relative to most recent prior urinalysis from 1 month ago, all demonstrate no evidence of squamous epithelial cells to suggest a contaminated specimen.   Additionally, the patient possesses recent diagnosis of acute cholecystectomy for which the patient underwent percutaneous cholecystostomy drain placement on 09/07/2021, with cholecystostomy tube exchange by IR on 10/24/2021, and IR cholangiogram performed just a few hours ago, which was suggestive of appropriate placement and functionality of the cholecystostomy drain.  Is also in the context of report of completion of IV antibiotic course at some point during the previous hospitalization, although the details of the specific nature of the antibiotic, duration, and date upon which the final doses of these antibiotics were completed are unclear to me at this time.  In this context, today's labs reflect interval development of transaminitis, as further quantified above.  As transaminitis occurred in concert with evidence of an appropriately functioning/placed cholecystostomy drain, this overall clinical picture and laboratory trend is concerning for potential worsening of acute cholecystitis and potential failure of these conservative measures.  We will obtain additional abdominal imaging, and further trend liver enzymes to guide further evaluation and intervention, while initiating IV antibiotics to include anaerobic coverage, as further detailed below.  Of note, given the associated presence of suspected end organ damage in the form of concominant presenting elevated lactate of 3.2 as well as the presence of acute kidney injury, criteria are met for pt's sepsis to be considered severe in nature. However, in the absence of lactic  acid level that is greater than or equal to 4.0, and in the absence of any associated hypotension refractory to IVF's, there are no indications for administration of a 30 mL/kg IVF bolus at this time.   Additional ED work-up/management notable for: Collection of blood cultures x2 as well as urine culture prior to initiation of IV antibiotics.   No e/o additional infectious process at this time, including  chest x-ray, which showed no evidence of acute cardiopulmonary process.   Plan: CBC w/ diff and CMP in AM.  Follow for results of blood cx's x 2 as well as urine culture. Abx: Start Rocephin and IV Flagyl, as above.  Repeat lactate at [place] on 11/07/2021.  Initiate continuous IV fluids.  Repeat CMP/CBC in the morning.  Check GGT and direct bilirubin.  Check INR.  Right upper quadrant ultrasound ordered for the morning.  N.p.o. after midnight.  Hold home high intensity rosuvastatin.  Add on procalcitonin level.           #) Generalized weakness: presentation a./w generalized weakness, with likely contributions from relative deconditioning in the context of recent prolonged hospitalization, as well as updated findings include severe sepsis due to potentially multiple infectious sources, as above.  It is noted that the patient been working with both PT and OT during his inpatient rehab stay.   Plan: Further evaluation and management of severe sepsis, as above, including IV antibiotics.  Continuous IV fluids.  Monitor strict I's and O's and daily weights.  Check TSH.  Repeat CMP/CBC in the morning.  I have placed consults for PT/OT.  Fall precautions.              #) Type 2 Diabetes Mellitus: documented history of such.  During inpatient rehab included insulin regimen: Insulin glargine 50 units SQ twice daily as well as sliding scale coverage.  This is in addition to glipizide.  presenting blood sugar this evening noted to be 250, likely with a hyperglycemic component as a consequence of  reaction to physiologic stress stemming from severe sepsis due to multiple underlying infectious sources, as above, particularly in the context of recently noted hemoglobin A1c of 5.1% when checked on 10/27/2021.   Plan: In the context of plan for n.p.o. after midnight in pursuit of right upper quadrant ultrasound, have ordered Accu-Cheks every 6 hours with associated sign scale coverage.  Insulin glargine 12 units SQ twice daily.  Hold home glipizide during this hospitalization.             #) Essential Hypertension: documented h/o such, with outpatient antihypertensive regimen including doxazosin.  SBP's have remained normotensive including during new diagnosis of SVT, as above.  Given increased risk for reflex tachycardia associated doxazosin, in addition to presence of severe sepsis, will hold this and hypertensive medication for now.   Plan: Close monitoring of subsequent BP via routine VS. hold home doxazosin for now, as above.           #) Hyperlipidemia: documented h/o such. On high intensity rosuvastatin as outpatient.  Given interval increase in transaminases, as quantified above, will hold home rosuvastatin for now.   Plan: Hold home statin for now, as above.                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  No evidence of additional acute blood loss since presenting to inpatient rehab.  Additionally, today CBC reflects stable hemoglobin, as further quantified above.  Not currently on any blood thinners, but rather on Pepcid p.o. twice daily.  Plan: Repeat CBC in the morning.  Check INR.  We will plan to resume aforementioned Pepcid.     DVT prophylaxis: SCD's   Code Status: Full code Family Communication: none Disposition Plan: Per Rounding Team Consults  called: none;   Admission status: obs    PLEASE NOTE THAT DRAGON DICTATION SOFTWARE WAS USED IN THE CONSTRUCTION OF THIS NOTE.   [place] DO [facility]  From [place]   11/06/2021, 10:38 PM

## 2021-11-06 NOTE — Progress Notes (Addendum)
RN was calling PA on call re: patient foley was discontinued at 11am patient tried to go to bathroom at 1400 but unable to have urine; bladder scanned patient he has 160cc. Order was given to I and o cath patient when RN was called to room by wife re: patient was shaking saying " am cold ", V/S was done; CBG was done. MEWS was triggered. CN was notified. PA was notified. New orders noted. Rapid response was called. Waiting for hospitalist consult. Report given to incoming RN.

## 2021-11-06 NOTE — Progress Notes (Signed)
Occupational Therapy Session Note  Patient Details  Name: Troy Randall MRN: [id number] Date of Birth: 11-Jul-1960  Today's Date: 11/06/2021 OT Individual Time: 7253-6644 OT Individual Time Calculation (min): 60 min  OT Individual Time: 1415-1500 OT Individual Time Calculation (min): 45 min   Short Term Goals: Week 1:  OT Short Term Goal 1 (Week 1): Pt will demonstrate improved awareness of LUE by washing his L arm with no more than 2 cues to fully attend to it. OT Short Term Goal 2 (Week 1): Pt will demonstrate improved motor planning to don shirt with min A and mod cues or less. OT Short Term Goal 3 (Week 1): Pt will be able to stand in stedy and actively pull pants down to prep for toileting. OT Short Term Goal 4 (Week 1): Pt will be able to stand in stedy and actively pull pants up post toileting with min A. OT Short Term Goal 5 (Week 1): Pt wil self feed with supervision.  Skilled Therapeutic Interventions/Progress Updates:     Session 1: Pt received in room resting in wc with wife present in good spirits and agreeable to OT session. Pt transported the therapy gym to participate in session focused on increasing awareness of L side and standing balance for functional mobility in preparation for ADLs. Pt completed ball toss game with medium sized ball utilizing BUE to throw ball overhead. Pt prefers to use RUE>LUE during task requiring moderate cues to bring LUE off arm rest to throw ball.  Pt completed sit>stands x2 with CGA while playing game of connect 4 with OT. Pt maintained standing balance for 5 minutes at a time with CGA with RW. Pt required max gestural cues to initiate putting in game piece during Pt turn. Pt become emotional labile during game stating "I am sorry I cannot do it". Therapeutic support provided with noted improvement in emotional status.  Pt then completed simple matching card game using "go fish cards" while standing at elevated table with his RW. Cards presented  to Pt L visual field to increase attention. Pt required moderate multimodal cues to turn head and motivational support to complete activity. Task challenges downgraded d/t Pt presenting overwhelmed  from 6 cards to 3 with noted improvement and attention to task. Pt was transported back to his room total A in wc and left with posey belt on, call bell in reach, wife present, and all needs met.   Session 2:  Pt received in room resting in wc with wife present. Pt refused ADLs today but was receptive to OT session. Pt transported total A to therapy gym to participate in skilled OT session.  Pt completed bean bag matching game completing sit>stands at elevated table with RW between matches. Pt presented with two colored circles on table and one bean bag presented in L visual field. Pt was able to match bean bag to colored circle when provided with max multimodal cues. Pt continuously stated "I'm sorry, I cannot do it" and became emotionally labile. Therapeutic support and rest break provided with noted improvement in moral.  Pt then completed bean bag toss game while seated in wc with good attention and motivation to complete task. Pt noted to enjoy game. Pt then completed the game in standing 2x 6 bean bag throws to address dynamic standing deficits.Bean bags presented in Pt left visual field with Pt visually scanning with min verbal cues and reaching outside BOS with one hand on RW CGA. Pt transported back to his room  and left with seat belt alarm on, call bell in reach, and all needs met.   Therapy Documentation Precautions:  Precautions Precautions: Fall Precaution Comments: R drain Restrictions Weight Bearing Restrictions: No   Vital Signs: Therapy Vitals Temp: 97.9 F (36.6 C) Temp Source: Oral Pulse Rate: (Abnormal) 109 Resp: 18 BP: 112/76 Oxygen Therapy SpO2: 100 % O2 Device: Room Air Pain: Pain Assessment Pain Scale: 0-10 Faces Pain Scale: Hurts even more Pain Type: Acute pain Pain  Location: Abdomen Pain Orientation: Right Pain Descriptors / Indicators: Aching Pain Frequency: Occasional Pain Onset: On-going Pain Intervention(s): Medication (See eMAR) ADL: ADL Eating: Minimal assistance Grooming: Minimal assistance, Maximal cueing Where Assessed-Grooming: Edge of bed Upper Body Bathing: Maximal cueing, Supervision/safety Where Assessed-Upper Body Bathing: Edge of bed Lower Body Bathing: Maximal cueing, Moderate assistance Where Assessed-Lower Body Bathing: Edge of bed Upper Body Dressing: Maximal cueing, Supervision/safety Where Assessed-Upper Body Dressing: Edge of bed Lower Body Dressing: Maximal cueing, Maximal assistance Where Assessed-Lower Body Dressing: Edge of bed (use of stedy) Toileting: Other (Comment) (simulated activity with BSC over toilet and use of stedy) Where Assessed-Toileting: Glass blower/designer: Moderate assistance (simulated activity with BSC over toilet and use of stedy) Toilet Transfer Method: Other (comment) (stedy) Toilet Transfer Equipment: Bedside commode   Therapy/Group: Individual Therapy  Janey Genta 11/06/2021, 2:52 PM

## 2021-11-06 NOTE — Progress Notes (Signed)
Called regarding onset of rigors and rectal temp of 100. Sepsis work-up initiated and in and out cath for UA and culture. RN called again stating patient's heart rate in the 160s apical. Rapid response in progress.  RR team recommends transfer to telemetry bed. Paged [place] and discussed case with Dr. Velia Meyer.  Portable Cxr ordered.

## 2021-11-06 NOTE — Progress Notes (Signed)
Physical Therapy Session Note  Patient Details  Name: Troy Randall MRN: [id number] Date of Birth: 06-05-1960  Today's Date: 11/06/2021 PT Individual Time: 7001-7494 PT Individual Time Calculation (min): 40 min   Short Term Goals: Week 1:  PT Short Term Goal 1 (Week 1): Pt will perform bed mobility with mod assist and mod cues for initiation PT Short Term Goal 2 (Week 1): Pt will ambulate 18ft with mod assist of 1 and RW PT Short Term Goal 3 (Week 1): Pt will tolerate being out of bed >1 hour between therapies PT Short Term Goal 4 (Week 1): Pt will attend to task for at least 2 minutes with only mod assist  Skilled Therapeutic Interventions/Progress Updates: Pt presented in bed with nsg and MD present agreeable to therapy. Pt denies pain during session. Pt performed supine to sit with modA for truncal support and use of bed features to allow pt to receive meds more appropriately. PTA threaded shorts total A for time management and stood with minA from EOB with pt able to pull pants over hips with minA. Pt then ambulated to rehab gym with RW and CGA for ambulating but required modA for RW management with x 1 standing rest break due to fatigue. In gym pt required mod to maxA for RW management to turn and sit at mat. Pt then participated in ambulation in gym weaving around cones. Pt ambulated with narrow BOS at times scissoring but able to correct BOS with verbal cues. Pt with significant difficulty when instructing to turn to "right" but was slightly easier when instructed to turn to L. Pt participated in same activity but PTA changed cues and used "stop", "hard turn (to L or R)" with mild improvement. Pt then participated in standing and seated ball taps with pt able to show hand and recall correct side. Pt stood with RW and was initially able to catch with BUE. When PTA instructed in using specific pt would only use R hand. PTA noted increased knee instability therefore transitioned activity to  sitting with PTA tapping pt on which side to try to reach with. Pt was able to verbalize when side PTA was tapping but pt continued to use only R side. Pt was able to use LUE when PTA blocked use of RUE only. Pt performed ambulatory transfer to w/c with RW and assist for RW management. Pt transported back to room at end of session and agreeable to remain in w/c until OT session (approx 1 hr). Pt left with belt alarm on, call bell within reach and wife present.      Therapy Documentation Precautions:  Precautions Precautions: Fall Precaution Comments: R drain Restrictions Weight Bearing Restrictions: No General:   Vital Signs: Therapy Vitals Temp: 97.9 F (36.6 C) Temp Source: Oral Pulse Rate: (!) 109 Resp: 18 BP: 112/76 Oxygen Therapy SpO2: 100 % O2 Device: Room Air Pain: Pain Assessment Pain Scale: 0-10 Faces Pain Scale: Hurts even more Pain Type: Acute pain Pain Location: Abdomen Pain Orientation: Right Pain Descriptors / Indicators: Aching Pain Frequency: Occasional Pain Onset: On-going Pain Intervention(s): Medication (See eMAR) Mobility:   Locomotion :    Trunk/Postural Assessment :    Balance:   Exercises:   Other Treatments:      Therapy/Group: Individual Therapy  Troy Randall 11/06/2021, 4:19 PM

## 2021-11-06 NOTE — Plan of Care (Signed)
  Problem: RH BLADDER ELIMINATION Goal: RH STG MANAGE BLADDER WITH EQUIPMENT WITH ASSISTANCE Description: STG Manage Bladder With Equipment With toileting Assistance Outcome: Not Progressing; foley d/c order today per order ; voiding trial

## 2021-11-06 NOTE — Progress Notes (Signed)
Speech Language Pathology Daily Session Note  Patient Details  Name: Troy Randall MRN: [id number] Date of Birth: 11-12-1960  Today's Date: 11/06/2021 SLP Individual Time: 1300-1345 SLP Individual Time Calculation (min): 45 min  Short Term Goals: Week 2: SLP Short Term Goal 1 (Week 2): STG=LTG due to ELOS  Skilled Therapeutic Interventions: Skilled ST treatment focused on cognitive and swallowing goals. SLP assessed tolerance to dysphagia 3 textures and thin liquids. Upon arrival pt was being provided total A by brother for feeding with noon meal with sub optimal postioning in bed. SLP provided education on proper positioning for safe PO consumption and facilitated repositioning. Brother verbalized understanding through teach back. Pt consumed dys 3 textures with mildly prolonged mastication and with min A verbal cues for attention to task. Pt exhibited no overt s/sx of aspiration with solids and thin liquids through straw. Continue current diet at this time.   Pt transferred from bed to w/c using RW and mod A verbal cues for following 1-step instructions. SLP took pt and brother to fall festival on unit where pt participated in corn hole and harvest pong games with max A multimodal cues for following 1-step commands and for redirection to task due to external distractibility within a mild-to-moderately distracting environment. Pt attempted to write down what he was thankful for on the "gratitude tree" however written attempts resulted in what appeared to be scribbles. Pt very hard on himself and emotionally labile throughout session. He appeared in better spirits when SLP and brother provided ongoing verbal encouragement. Pt was returned to room and left in wheelchair with nurse on arrival to take vitals. Brother and spouse at bedside.  Continue per current plan of care.      Pain Pain Assessment Pain Scale: 0-10 Faces Pain Scale: Hurts even more Pain Type: Acute pain Pain Location:  Abdomen Pain Orientation: Right Pain Descriptors / Indicators: Aching Pain Frequency: Occasional Pain Onset: On-going Pain Intervention(s): Medication (See eMAR)  Therapy/Group: Individual Therapy  Patty Sermons 11/06/2021, 2:43 PM

## 2021-11-06 NOTE — Progress Notes (Signed)
   11/06/21 2236  Assess: MEWS Score  Temp 98.3 F (36.8 C)  BP 101/68  Pulse Rate (!) 118  Resp 20  SpO2 92 %  O2 Device Room Air  Assess: MEWS Score  MEWS Temp 0  MEWS Systolic 0  MEWS Pulse 2  MEWS RR 0  MEWS LOC 0  MEWS Score 2  MEWS Score Color Yellow  Assess: if the MEWS score is Yellow or Red  Were vital signs taken at a resting state? Yes  Focused Assessment Change from prior assessment (see assessment flowsheet)  Does the patient meet 2 or more of the SIRS criteria? Yes  Does the patient have a confirmed or suspected source of infection? No  MEWS guidelines implemented *See Row Information* No, previously yellow, continue vital signs every 4 hours  Document  Patient Outcome Stabilized after interventions  Progress note created (see row info) Yes  Assess: SIRS CRITERIA  SIRS Temperature  0  SIRS Pulse 1  SIRS Respirations  0  SIRS WBC 1  SIRS Score Sum  2

## 2021-11-06 NOTE — Progress Notes (Signed)
Call into IR to assess cholecystostomy drain. He is having significant pain with flushes. No fever, leukocytosis or abd pain otherwise. LFTs were normal (10/27). Currently in NAD. Abd soft. Scant cloudy bilious output.  I spoke with IR and they will arrange cholangiogram to assess drain.

## 2021-11-06 NOTE — Progress Notes (Signed)
Septic work up in progress. CXR done. Lab draws ongoing. In and out Cath done and UA/U-Culture sent for analysis. Patient clean up after having a Bowel movement. Patient and family Reassured. Collaboration ongoing to [place] for. Possible D/C to PCU. Safety ensured at all times.

## 2021-11-06 NOTE — Patient Care Conference (Signed)
Inpatient RehabilitationTeam Conference and Plan of Care Update Date: 11/05/2021   Time: 10:33 AM    Patient Name: Troy Randall      Medical Record Number: [id number]  Date of Birth: 18-Mar-1960 Sex: Male         Room/Bed: 4M12C/4M12C-01 Payor Info: Payor: [employer] MEDICARE / Plan: [place] MEDICARE HMO/PPO / Product Type: *No Product type* /    Admit Date/Time:  10/30/2021  3:00 PM  Primary Diagnosis:  Encephalomyelopathy  Hospital Problems: Principal Problem:   Encephalomyelopathy Active Problems:   MS (multiple sclerosis) ([place])    Expected Discharge Date: Expected Discharge Date: 11/14/21  Team Members Present: Physician leading conference: Dr. Alysia Penna Social Worker Present: Erlene Quan, BSW Nurse Present: Dorien Chihuahua, RN PT Present: Page Spiro, PT OT Present: Other (comment) Lowella Fairy, OT) SLP Present: Sherren Kerns, SLP PPS Coordinator present : Gunnar Fusi, SLP     Current Status/Progress Goal Weekly Team Focus  Bowel/Bladder   incont of Bowel. Foley in place.  regain cont.  assess toileting q shift and PRN   Swallow/Nutrition/ Hydration   Dys 3 diet with thin liquids  sup A  regular PO trials, tolerance of current diet with implementation of safe swallowing precautions   ADL's   Min A UB dressing, Mod A with Mac VB cues UB bahting, Mod A LB dressing/bathing, Max a toilet/tub bench transfers  Min A to supervision  ADL retrianing, incrasing attention to L side, increased awareness, attention to tasks, functional mobility, activity tolerance, pain management   Mobility   min assist bed mobility, min assist sit<>stand and stand pivot transfers using RW, min/mod assist with ambulation using RW with scissoring and swaying gait pattern, heavy assistance needed for RW management due to pt veering heavily to the left. Can amb up to 27f with RW. L inattention  CGA ambulatory level  gait training, balance training, transfer training, endurance,  pt/family education, AD management, standing tolerance and balance   Communication             Safety/Cognition/ Behavioral Observations  max A  min A (plan to downgrade goals)  focused/sustained attention, orientation,   Pain   generalized pain 4 of 10  pain<3  assess pain q shift and PRN   Skin   MASD, stage 2 to sacrum  proper healing  assess skin q shift and PRN     Discharge Planning:  Discharging home with spouse to assist MIN A, 24/7   Team Discussion: Patient with medical issues; cholelithiasis and GI bleed; MS workup ongoing. Treated with IV steroids and plasma exchange. Foley in place; stage 2 on sacrum with MASD. Note emotional lability, scissoring gait and left say, decreased attention and slow processing left side neglect and inability to follow commands. Contact precautions for MRSA in tracheal aspirate.   Patient on target to meet rehab goals: Currently needs min assist for bathing and dressing with min assist for sit - stand. Requires max assist for cognition. Tolerating a D3/thin diet.  *See Care Plan and progress notes for long and short-term goals.   Revisions to Treatment Plan:  Downgraded SLP goals Neuropsych consult   Teaching Needs: Safety, medications, dietary modification, skin care/drain care, transfers, toileting, etc  Current Barriers to Discharge: Decreased caregiver support and Home enviroment access/layout  Possible Resolutions to Barriers: Family education 24[facility]     Medical Summary Current Status: Still has Foley as well as gallbladder drain.  Continues with cognitive dysfunction, underlying CNS  demyelinating disease with concomitant debility  Barriers to Discharge: Medical stability;Neurogenic Bowel & Bladder   Possible Resolutions to Barriers/Weekly Focus: Adjusting diabetic medications, voiding trial prior to discharge   Continued Need for Acute Rehabilitation Level of Care: The patient requires daily medical  management by a physician with specialized training in physical medicine and rehabilitation for the following reasons: Direction of a multidisciplinary physical rehabilitation program to maximize functional independence : Yes Medical management of patient stability for increased activity during participation in an intensive rehabilitation regime.: Yes Analysis of laboratory values and/or radiology reports with any subsequent need for medication adjustment and/or medical intervention. : Yes   I attest that I was present, lead the team conference, and concur with the assessment and plan of the team.   Dorien Chihuahua B 11/06/2021, 9:41 AM

## 2021-11-06 NOTE — Progress Notes (Signed)
PROGRESS NOTE   Subjective/Complaints:  No issues, discussed voiding trial  ROS: Denies fevers, chills, N/V, abdominal pain, constipation, diarrhea, SOB, cough, chest pain, new weakness or paraesthesias.    Objective:   No results found. No results for input(s): "WBC", "HGB", "HCT", "PLT" in the last 72 hours.  No results for input(s): "NA", "K", "CL", "CO2", "GLUCOSE", "BUN", "CREATININE", "CALCIUM" in the last 72 hours.   Intake/Output Summary (Last 24 hours) at 11/06/2021 0804 Last data filed at 11/06/2021 0651 Gross per 24 hour  Intake 242 ml  Output 625 ml  Net -383 ml      Pressure Injury 10/06/21 Sacrum Medial Stage 2 -  Partial thickness loss of dermis presenting as a shallow open injury with a red, pink wound bed without slough. (Active)  10/06/21 2000  Location: Sacrum  Location Orientation: Medial  Staging: Stage 2 -  Partial thickness loss of dermis presenting as a shallow open injury with a red, pink wound bed without slough.  Wound Description (Comments):   Present on Admission: Yes    Physical Exam: Vital Signs Blood pressure 113/73, pulse 99, temperature 98.6 F (37 C), temperature source Oral, resp. rate 18, height 6' (1.829 m), weight 120.3 kg, SpO2 98 %.   General: No acute distress Mood and affect are appropriate Heart: Regular rate and rhythm no rubs murmurs or extra sounds Lungs: Clear to auscultation, breathing unlabored, no rales or wheezes Abdomen: Positive bowel sounds, soft nontender to palpation, nondistended + RLQ drain with minimal output, drain site appears c/d/I, sutured in place.  Extremities: No clubbing, cyanosis, or edema GU- foley in place draining clear yellow urine  Skin: No evidence of breakdown, no evidence of rash, +sacral heart.  Neurologic: Cranial nerves II through XII intact, motor strength is 4/5 in bilateral deltoid, bicep, tricep, grip, hip flexor, knee extensors,  ankle dorsiflexor and plantar flexor Sensory exam normal sensation to light touch and proprioception in bilateral upper and lower extremities Cerebellar exam normal finger to nose to finger as well as heel to shin in bilateral upper and lower extremities Musculoskeletal: Full range of motion in all 4 extremities. No joint swelling   Assessment/Plan: 1. Functional deficits which require 3+ hours per day of interdisciplinary therapy in a comprehensive inpatient rehab setting. Physiatrist is providing close team supervision and 24 hour management of active medical problems listed below. Physiatrist and rehab team continue to assess barriers to discharge/monitor patient progress toward functional and medical goals  Care Tool:  Bathing    Body parts bathed by patient: Chest, Abdomen, Face, Left arm, Right arm   Body parts bathed by helper: Front perineal area, Buttocks, Right upper leg, Left upper leg, Right lower leg, Left lower leg     Bathing assist Assist Level: Maximal Assistance - Patient 24 - 49% (max cues)     Upper Body Dressing/Undressing Upper body dressing   What is the patient wearing?: Pull over shirt    Upper body assist Assist Level: Maximal Assistance - Patient 25 - 49%    Lower Body Dressing/Undressing Lower body dressing      What is the patient wearing?: Incontinence brief, Pants     Lower  body assist Assist for lower body dressing: Total Assistance - Patient < 25%     Toileting Toileting Toileting Activity did not occur Landscape architect and hygiene only): N/A (no void or bm)  Toileting assist Assist for toileting: Dependent - Patient 0%     Transfers Chair/bed transfer  Transfers assist     Chair/bed transfer assist level: Moderate Assistance - Patient 50 - 74%     Locomotion Ambulation   Ambulation assist      Assist level: Moderate Assistance - Patient 50 - 74% Assistive device: Walker-rolling Max distance: 45   Walk 10 feet  activity   Assist     Assist level: Moderate Assistance - Patient - 50 - 74% Assistive device: Walker-rolling   Walk 50 feet activity   Assist Walk 50 feet with 2 turns activity did not occur: Safety/medical concerns         Walk 150 feet activity   Assist Walk 150 feet activity did not occur: Safety/medical concerns         Walk 10 feet on uneven surface  activity   Assist Walk 10 feet on uneven surfaces activity did not occur: Safety/medical concerns         Wheelchair     Assist Is the patient using a wheelchair?: Yes Type of Wheelchair: Manual    Wheelchair assist level: Minimal Assistance - Patient > 75% Max wheelchair distance: 50    Wheelchair 50 feet with 2 turns activity    Assist        Assist Level: Minimal Assistance - Patient > 75%   Wheelchair 150 feet activity     Assist      Assist Level: Moderate Assistance - Patient 50 - 74%   Blood pressure 113/73, pulse 99, temperature 98.6 F (37 C), temperature source Oral, resp. rate 18, height 6' (1.829 m), weight 120.3 kg, SpO2 98 %.  Medical Problem List and Plan: 1. 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 acute respiratory failure, and cholecystitis.             -patient may  shower- cover drain             -ELOS/Goals:11/10  2.  Antithrombotics: -DVT/anticoagulation:  Pharmaceutical: Lovenox, but given GIB and amb 95' in PT will d/c and add SCD             -antiplatelet therapy:Hx of CVA x 2 home Plavix and aspirin on hold due to GI bleed 3. Pain Management: Tylenol, Lidoderm patch as needed           - Drain site pain - drain appears c/d/I; at max lidoderm patches, can consider cutting or moving over drain site   4. Mood/Behavior/Sleep: LCSW to evaluate and provide emotional support             -antipsychotic agents: n/a 5.  Neuropsych/cognition: This patient is not capable of making decisions on his own behalf. 6. Skin/Wound Care: Routine skin checks             -sacral fissure with likely Candidiasis. Apply Nystatin powder.             -continue butt cream to anal area- skin without breakdown in sacro coccygeal area 7. Fluids/Electrolytes/Nutrition: Routine Is and Os and follow-up chemistries             - 10/30: labs stable  8: Cholecystis  s/p cholecystostomy tube; exchanged on 10/24/21             -surgical follow-up at discharge 9: DM: carb mod diet; CBGs q AC and q HS             -continue Semglee 15 units BID             -continue SSI q AC and q HS             -home Glucotrol XL 10 mg and Tresiba 20 units BID not restarted Recent Labs    11/05/21 1627 11/05/21 2100 11/06/21 0640  GLUCAP 155* 192* 118*   Controlled on Semglee , daytime elevation will add low dose glucotrol in am     10: Hypertension: monitor; home amlodipine 10 mg and lisinopril 40 mg not restarted             -on doxazosin 2 mg daily for urinary retention Vitals:   11/05/21 2020 11/06/21 0642  BP: 127/62 113/73  Pulse: (!) 47 99  Resp: 18 18  Temp: 98.2 F (36.8 C) 98.6 F (37 C)  SpO2: 97% 98%     11: Urinary retention with Foley re-inserted 10/10             -continue doxazosin, no need for flomax Voiding trial today   12: Hyperlipidemia: continue Crestor 13: GIB/GI prophylaxis: s/p 19-20 units pRBCs- continue Protonix but increase to BID             - monitor H and H             -follow-up with GI as outpatient    Latest Ref Rng & Units 11/03/2021    5:11 AM 11/01/2021    6:43 PM 10/31/2021    5:42 AM  CBC  WBC 4.0 - 10.5 K/uL 6.6  9.3  12.7   Hemoglobin 13.0 - 17.0 g/dL 9.5  10.6  10.0   Hematocrit 39.0 - 52.0 % 29.9  33.6  33.2   Platelets 150 - 400 K/uL 227  230  272     14: Chronic colitis by biopsy; unknown etiology- negative C diff              -will check cbc monitor stools , FOB likely  persistently + given recent massive GI bleed, no reason to recheck               No frank blood in stool   15: Acute respiratory failure s/p trach; decannulated 10/19 16. Probable MS-s/p high dose steroids and Plasma exchange-  17. Encephalopathy- impaired cognition- disoriented and perseverative  18. Low back pain and B/L shoulder pain- will change lidoderm patches to 3 8pm to 8am and con't tylenol- Goes to [facility] for injections- avoid opiates due to sedation/encephalopathy  19. Impaired intake/Dysphagia- on D3 nectart thick liquids- encouraged to drink 6-8 cups/day- I.e more nectar thick liquids.              LOS: 7 days A FACE TO FACE EVALUATION WAS PERFORMED  Charlett Blake 11/06/2021, 8:04 AM

## 2021-11-06 NOTE — Progress Notes (Signed)
   11/06/21 1830  Assess: MEWS Score  Temp 100 F (37.8 C)  BP (!) 159/96  Pulse Rate (!) 148  Level of Consciousness Alert  SpO2 100 %  Assess: MEWS Score  MEWS Temp 0  MEWS Systolic 0  MEWS Pulse 3  MEWS RR 0  MEWS LOC 0  MEWS Score 3  MEWS Score Color Yellow  Assess: if the MEWS score is Yellow or Red  Were vital signs taken at a resting state? Yes  Focused Assessment Change from prior assessment (see assessment flowsheet)  Does the patient meet 2 or more of the SIRS criteria? Yes  Does the patient have a confirmed or suspected source of infection? Yes  Provider and Rapid Response Notified? Yes  MEWS guidelines implemented *See Row Information* Yes  Take Vital Signs  Increase Vital Sign Frequency  Yellow: Q 2hr X 2 then Q 4hr X 2, if remains yellow, continue Q 4hrs  Escalate  MEWS: Escalate Yellow: discuss with charge nurse/RN and consider discussing with provider and RRT  Notify: Charge Nurse/RN  Name of Charge Nurse/RN Notified Hilary RN  Date Charge Nurse/RN Notified 11/06/21  Time Charge Nurse/RN Notified 1830  Notify: Provider  Provider Name/Title Risa Grill PA  Date Provider Notified 11/06/21  Time Provider Notified 2694  Method of Notification Call  Notification Reason Change in status  Provider response See new orders  Date of Provider Response 11/06/21  Time of Provider Response 1830  Notify: Rapid Response  Name of Rapid Response RN Notified Kennis Carina RN  Date Rapid Response Notified 11/06/21  Time Rapid Response Notified 8546  Document  Patient Outcome Other (Comment) (plan is to transfer patient to telemetry per rapid response team ; waiting for hospitalist consult)  Progress note created (see row info) Yes  Assess: SIRS CRITERIA  SIRS Temperature  0  SIRS Pulse 1  SIRS Respirations  0  SIRS WBC 1  SIRS Score Sum  2

## 2021-11-06 NOTE — Significant Event (Signed)
Rapid Response Event Note   Reason for Call :  Rectal fever 100.0 and shaking  Initial Focused Assessment:  Patient in bed moaning tremors in all extremities, states "I am cold." Patient under multiple blankets, warm to touch. A&O x2. Patient taking shallow breaths though not in respiratory distress. Appears anxious.  Lung sounds clear. Patient vomited x1.   VS: 131/65 (84) HR 159 RR 24 O2 100% RA Temp 100 rectally CBG 134  Interventions:  Removed blankets Administered rectal tylenol 650mg  EKG Inserted IV Administered IV compazine 10mg   Plan of Care:  Consult [name] per CIR PA  Updated VS 1915 111/60 (75) HR 134 RR 20 O2 98% RA  Event Summary:  MD Notified: Risa Grill PA Call Time: [place] Time: 1941 End Time: 1920 handoff given to nightshift RRT RN  Newman Nickels, RN

## 2021-11-06 NOTE — Progress Notes (Signed)
Patient complaining of severe pain when flushing the bilary drain along with shortness of breath.

## 2021-11-07 ENCOUNTER — Observation Stay (HOSPITAL_COMMUNITY): Payer: Medicare HMO

## 2021-11-07 ENCOUNTER — Inpatient Hospital Stay (HOSPITAL_COMMUNITY): Payer: Medicare HMO

## 2021-11-07 DIAGNOSIS — G0400 Acute disseminated encephalitis and encephalomyelitis, unspecified: Secondary | ICD-10-CM | POA: Diagnosis not present

## 2021-11-07 DIAGNOSIS — B961 Klebsiella pneumoniae [K. pneumoniae] as the cause of diseases classified elsewhere: Secondary | ICD-10-CM | POA: Diagnosis not present

## 2021-11-07 DIAGNOSIS — I10 Essential (primary) hypertension: Secondary | ICD-10-CM | POA: Diagnosis not present

## 2021-11-07 DIAGNOSIS — E669 Obesity, unspecified: Secondary | ICD-10-CM | POA: Diagnosis not present

## 2021-11-07 DIAGNOSIS — A419 Sepsis, unspecified organism: Secondary | ICD-10-CM | POA: Insufficient documentation

## 2021-11-07 DIAGNOSIS — R7989 Other specified abnormal findings of blood chemistry: Secondary | ICD-10-CM | POA: Diagnosis not present

## 2021-11-07 DIAGNOSIS — N179 Acute kidney failure, unspecified: Secondary | ICD-10-CM | POA: Diagnosis not present

## 2021-11-07 DIAGNOSIS — N3000 Acute cystitis without hematuria: Secondary | ICD-10-CM | POA: Diagnosis not present

## 2021-11-07 DIAGNOSIS — R6520 Severe sepsis without septic shock: Secondary | ICD-10-CM | POA: Diagnosis not present

## 2021-11-07 DIAGNOSIS — E1169 Type 2 diabetes mellitus with other specified complication: Secondary | ICD-10-CM | POA: Diagnosis not present

## 2021-11-07 DIAGNOSIS — F17220 Nicotine dependence, chewing tobacco, uncomplicated: Secondary | ICD-10-CM | POA: Diagnosis not present

## 2021-11-07 DIAGNOSIS — G610 Guillain-Barre syndrome: Secondary | ICD-10-CM | POA: Diagnosis not present

## 2021-11-07 DIAGNOSIS — G35 Multiple sclerosis: Secondary | ICD-10-CM | POA: Diagnosis not present

## 2021-11-07 DIAGNOSIS — E785 Hyperlipidemia, unspecified: Secondary | ICD-10-CM | POA: Diagnosis not present

## 2021-11-07 DIAGNOSIS — I4891 Unspecified atrial fibrillation: Secondary | ICD-10-CM

## 2021-11-07 DIAGNOSIS — Z8249 Family history of ischemic heart disease and other diseases of the circulatory system: Secondary | ICD-10-CM | POA: Diagnosis not present

## 2021-11-07 DIAGNOSIS — G9340 Encephalopathy, unspecified: Secondary | ICD-10-CM | POA: Diagnosis not present

## 2021-11-07 DIAGNOSIS — E8809 Other disorders of plasma-protein metabolism, not elsewhere classified: Secondary | ICD-10-CM | POA: Diagnosis not present

## 2021-11-07 DIAGNOSIS — G8929 Other chronic pain: Secondary | ICD-10-CM | POA: Diagnosis not present

## 2021-11-07 DIAGNOSIS — A4150 Gram-negative sepsis, unspecified: Secondary | ICD-10-CM | POA: Diagnosis not present

## 2021-11-07 DIAGNOSIS — Z794 Long term (current) use of insulin: Secondary | ICD-10-CM | POA: Diagnosis not present

## 2021-11-07 DIAGNOSIS — Z6835 Body mass index (BMI) 35.0-35.9, adult: Secondary | ICD-10-CM | POA: Diagnosis not present

## 2021-11-07 DIAGNOSIS — N289 Disorder of kidney and ureter, unspecified: Secondary | ICD-10-CM | POA: Diagnosis not present

## 2021-11-07 DIAGNOSIS — N200 Calculus of kidney: Secondary | ICD-10-CM | POA: Diagnosis not present

## 2021-11-07 DIAGNOSIS — E1165 Type 2 diabetes mellitus with hyperglycemia: Secondary | ICD-10-CM | POA: Diagnosis not present

## 2021-11-07 DIAGNOSIS — E876 Hypokalemia: Secondary | ICD-10-CM | POA: Diagnosis not present

## 2021-11-07 DIAGNOSIS — B9620 Unspecified Escherichia coli [E. coli] as the cause of diseases classified elsewhere: Secondary | ICD-10-CM | POA: Diagnosis not present

## 2021-11-07 DIAGNOSIS — M545 Low back pain, unspecified: Secondary | ICD-10-CM | POA: Diagnosis not present

## 2021-11-07 DIAGNOSIS — I471 Supraventricular tachycardia, unspecified: Secondary | ICD-10-CM | POA: Diagnosis not present

## 2021-11-07 DIAGNOSIS — E872 Acidosis, unspecified: Secondary | ICD-10-CM | POA: Diagnosis not present

## 2021-11-07 DIAGNOSIS — R5381 Other malaise: Secondary | ICD-10-CM | POA: Diagnosis not present

## 2021-11-07 DIAGNOSIS — K5730 Diverticulosis of large intestine without perforation or abscess without bleeding: Secondary | ICD-10-CM | POA: Diagnosis not present

## 2021-11-07 DIAGNOSIS — R7401 Elevation of levels of liver transaminase levels: Secondary | ICD-10-CM | POA: Diagnosis not present

## 2021-11-07 DIAGNOSIS — R531 Weakness: Secondary | ICD-10-CM | POA: Insufficient documentation

## 2021-11-07 DIAGNOSIS — E119 Type 2 diabetes mellitus without complications: Secondary | ICD-10-CM | POA: Diagnosis not present

## 2021-11-07 DIAGNOSIS — D649 Anemia, unspecified: Secondary | ICD-10-CM | POA: Diagnosis not present

## 2021-11-07 DIAGNOSIS — D638 Anemia in other chronic diseases classified elsewhere: Secondary | ICD-10-CM | POA: Diagnosis not present

## 2021-11-07 DIAGNOSIS — N390 Urinary tract infection, site not specified: Secondary | ICD-10-CM | POA: Diagnosis present

## 2021-11-07 DIAGNOSIS — Z8719 Personal history of other diseases of the digestive system: Secondary | ICD-10-CM | POA: Diagnosis not present

## 2021-11-07 DIAGNOSIS — K810 Acute cholecystitis: Secondary | ICD-10-CM | POA: Diagnosis not present

## 2021-11-07 LAB — CBC WITH DIFFERENTIAL/PLATELET
Abs Immature Granulocytes: 0.09 10*3/uL — ABNORMAL HIGH (ref 0.00–0.07)
Basophils Absolute: 0.1 10*3/uL (ref 0.0–0.1)
Basophils Relative: 0 %
Eosinophils Absolute: 0 10*3/uL (ref 0.0–0.5)
Eosinophils Relative: 0 %
HCT: 30.9 % — ABNORMAL LOW (ref 39.0–52.0)
Hemoglobin: 9.7 g/dL — ABNORMAL LOW (ref 13.0–17.0)
Immature Granulocytes: 1 %
Lymphocytes Relative: 2 %
Lymphs Abs: 0.4 10*3/uL — ABNORMAL LOW (ref 0.7–4.0)
MCH: 28 pg (ref 26.0–34.0)
MCHC: 31.4 g/dL (ref 30.0–36.0)
MCV: 89.3 fL (ref 80.0–100.0)
Monocytes Absolute: 0.5 10*3/uL (ref 0.1–1.0)
Monocytes Relative: 3 %
Neutro Abs: 15.4 10*3/uL — ABNORMAL HIGH (ref 1.7–7.7)
Neutrophils Relative %: 94 %
Platelets: 201 10*3/uL (ref 150–400)
RBC: 3.46 MIL/uL — ABNORMAL LOW (ref 4.22–5.81)
RDW: 15.1 % (ref 11.5–15.5)
WBC: 16.4 10*3/uL — ABNORMAL HIGH (ref 4.0–10.5)
nRBC: 0 % (ref 0.0–0.2)

## 2021-11-07 LAB — BLOOD CULTURE ID PANEL (REFLEXED) - BCID2

## 2021-11-07 LAB — COMPREHENSIVE METABOLIC PANEL
ALT: 596 U/L — ABNORMAL HIGH (ref 0–44)
AST: 1150 U/L — ABNORMAL HIGH (ref 15–41)
Albumin: 2.9 g/dL — ABNORMAL LOW (ref 3.5–5.0)
Alkaline Phosphatase: 426 U/L — ABNORMAL HIGH (ref 38–126)
Anion gap: 9 (ref 5–15)
BUN: 13 mg/dL (ref 6–20)
CO2: 24 mmol/L (ref 22–32)
Calcium: 8.6 mg/dL — ABNORMAL LOW (ref 8.9–10.3)
Chloride: 104 mmol/L (ref 98–111)
Creatinine, Ser: 1.03 mg/dL (ref 0.61–1.24)
GFR, Estimated: >60 mL/min (ref >60)
Glucose, Bld: 206 mg/dL — ABNORMAL HIGH (ref 70–99)
Potassium: 3.4 mmol/L — ABNORMAL LOW (ref 3.5–5.1)
Sodium: 137 mmol/L (ref 135–145)
Total Bilirubin: 1.8 mg/dL — ABNORMAL HIGH (ref 0.3–1.2)
Total Protein: 5.9 g/dL — ABNORMAL LOW (ref 6.5–8.1)

## 2021-11-07 LAB — PROTIME-INR
INR: 1.2 (ref 0.8–1.2)
Prothrombin Time: 15.4 seconds — ABNORMAL HIGH (ref 11.4–15.2)

## 2021-11-07 LAB — LACTIC ACID, PLASMA
Lactic Acid, Venous: 2.4 mmol/L (ref 0.5–1.9)
Lactic Acid, Venous: 1.5 mmol/L (ref 0.5–1.9)

## 2021-11-07 LAB — TSH: TSH: 1.057 u[IU]/mL (ref 0.350–4.500)

## 2021-11-07 LAB — GLUCOSE, CAPILLARY
Glucose-Capillary: 235 mg/dL — ABNORMAL HIGH (ref 70–99)
Glucose-Capillary: 145 mg/dL — ABNORMAL HIGH (ref 70–99)
Glucose-Capillary: 117 mg/dL — ABNORMAL HIGH (ref 70–99)
Glucose-Capillary: 101 mg/dL — ABNORMAL HIGH (ref 70–99)
Glucose-Capillary: 151 mg/dL — ABNORMAL HIGH (ref 70–99)

## 2021-11-07 LAB — MAGNESIUM: Magnesium: 2.1 mg/dL (ref 1.7–2.4)

## 2021-11-07 LAB — ECHOCARDIOGRAM LIMITED
S' Lateral: 2.6 cm
Weight: 4236.36 oz

## 2021-11-07 MED ORDER — FAMOTIDINE 20 MG PO TABS
20.0000 mg | ORAL_TABLET | Freq: Two times a day (BID) | ORAL | Status: DC
  Administered 2021-11-07 – 2021-11-12 (×10): 20 mg via ORAL
  Filled 2021-11-07 (×11): qty 1

## 2021-11-07 MED ORDER — METOPROLOL TARTRATE 25 MG PO TABS
25.0000 mg | ORAL_TABLET | Freq: Two times a day (BID) | ORAL | Status: DC
  Administered 2021-11-07 – 2021-11-12 (×10): 25 mg via ORAL
  Filled 2021-11-07 (×10): qty 1

## 2021-11-07 MED ORDER — POTASSIUM CHLORIDE CRYS ER 20 MEQ PO TBCR
40.0000 meq | EXTENDED_RELEASE_TABLET | ORAL | Status: AC
  Administered 2021-11-07 (×2): 40 meq via ORAL
  Filled 2021-11-07 (×2): qty 2

## 2021-11-07 MED ORDER — INSULIN GLARGINE-YFGN 100 UNIT/ML ~~LOC~~ SOLN
12.0000 [IU] | Freq: Two times a day (BID) | SUBCUTANEOUS | Status: DC
  Administered 2021-11-07 – 2021-11-12 (×11): 12 [IU] via SUBCUTANEOUS
  Filled 2021-11-07 (×12): qty 0.12

## 2021-11-07 NOTE — Assessment & Plan Note (Addendum)
Severe sepsis present on admission (lactic acidosis). Gram negative bacteremia.  Urinary retention (present on admission).  Blood culture positive for E coli, urine culture positive for Klebsiella. Sensitive to cephalosporins CT abdomen and pelvis with no signs of deep infection or collection.  Wbc 5,7  (Note that biliary drain was placed on 09/13 and culture was positive for E coli).  Case discussed with surgery, no current indication for cholecystectomy, continue drain care and close follow up as outpatient. Antibiotic therapy will be continued for at total of 10 days, 11/15/21.  IV Cefazolin as inpatient and Augmentin as outpatient.  Urinary retention, patient will need to keep foley cathter until follow up as outpatient with Urology.

## 2021-11-07 NOTE — Progress Notes (Signed)
Occupational Therapy Note  Patient Details  Name: Troy Randall MRN: [id number] Date of Birth: 1960-07-26  Occupational Therapy Discharge Note  This patient was unable to complete the inpatient rehab program due to medical complications; therefore did not meet their long term goals. Pt left the program at a mod assist level for their  functional ADLs with maximal multimodal cues needed. This patient is being discharged from OT services at this time.  BIMS at time of d/c  Pt unable to complete due to medical status  See CareTool for functional status details.   If the patient is able to return to inpatient rehabilitation within 3 midnights, this may be considered an interrupted stay and therapy services will resume as ordered. Modification and reinstatement of their goals will be made upon completion of therapy service reevaluations.    Janey Genta 11/07/2021, 12:29 PM

## 2021-11-07 NOTE — Progress Notes (Signed)
Progress Note   Patient: Troy Randall:[id number] DOB: 05-25-1960 DOA: 11/06/2021     0 DOS: the patient was seen and examined on 11/07/2021   Brief hospital course: [name] was admitted to the hospital with the working diagnosis of SVT, in the setting of sepsis and gram negative bacteremia.   61 yo male with the past medical history of T2DM, hypertension, and dyslipidemia, who was transferred from inpatient rehab due to the development of SVT.  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 Hospitalization complicated with multiple sclerosis treated with high dose systemic corticosteroids and plasma exchange, he required mechanical ventilation, and ultimately tracheostomy and PEG tube placement. During his hospitalization he was diagnosed with cholecystitis and cholecystectomy tube was placed.  At the inpatient rehab foley catheter was removed on 11/06/21 at 11 am, with no further urine output. He developed tachycardia 150 to 160 bpm prompting his transfer to the hospital.  At the time of his transfer his blood pressure was 118/59, HR 130 to 128, RR 31 and 02 saturation 97%, lungs with no wheezing or rhonchi, heart with S1 and S2 present and tachycardic, abdomen with no distention, cholecystectomy tube in place, no lower extremity edema.   Na 140, K 3,9 Cl 102 bicarbonate 23, glucose 250 bun 14 cr 1,15  Mag 1.5  ALK p 408, AST 1,227, ALT  535 T Bil 1,7  Lactic acid 3,2 and 2,4  Wbc 14,0 hgb 9,8 plt 215   Urine SG 1,023, 100 protein, > 50 rbc, 0-5 rbc   Blood cultures positive for E coli 2 out 2 bottles   Chest radiograph with no cardiomegaly, hypoinflation but no infiltrates.   EKG 165 bpm, normal axis, normal intervals, SVT rhythm, with no significant ST segment or T wave changes.   Abdominal US with decompressed gallbladder, cholecystectomy tube in place, with no other abnormalities.   Patient was placed on AV  blockade and antibiotic therapy  11/03 persistent urinary retention and foley cathter was reinserted.   Assessment and Plan: * SVT (supraventricular tachycardia) Patient has converted to sinus rhythm Continue rate control with low dose metoprolol.   Sepsis due to gram-negative UTI [facility]) Patient with positive blood culture to E coli Severe sepsis present on admission (lactic acidosis).  Positive fever spike this am and persistent urinary retention, required foley re insertion.  Wbc is 95.6  Systolic blood pressure 213 to 148 mmHg.   Plan to continue IV ceftriaxone, dc metronidazole Continue to follow up on cell count and temperature curve Follow up with culture sensitivities.  Hold on IV fluids.  Echocardiogram with preserved LV systolic function.    Essential hypertension Continue close blood pressure monitoring Continue low dose metoprolol and discontinue IV fluids   History of acute cholecystitis Elevated liver enzymes and hyper bilirubinemia  AST is trending down, ALT continue elevated along with T bil. Imaging with abdominal US with contracted gallbladder. Cholangiography per tube with appropriate positioned and functioning cholecystectomy. No fluoroscopic evidence of cystic duct patency.   Patient with no abdominal pain, no nausea or vomiting.  Advance diet to prior consistency of dysphagia 3 diet.  Continue drain care and follow up liver enzymes in am.   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   DM2 (diabetes mellitus, type 2) (HCC) Continue glucose cover and monitoring with insulin sliding scale.  Advance diet.  Fasting glucose is 206 ,g/dl.   Hyperlipidemia Hold on statin therapy until improvement in liver profile.   Hypomagnesemia Hypokalemia  Mg has improved at 2,1, K is 3,4 Plan to add Kcl 80 meq in 2 divided dose and follow up renal function and electrolytes in  am.   Class 2 obesity Calculated BMI 35,9   MS (multiple sclerosis) ([place]) Patient has been recovering well in inpatient rehab Continue with PT and OT Trach and peg have been removed.         Subjective: Patient is feeling better, no abdominal pain, no nausea or vomiting. Positive urinary retention sp foley cathter re insertion   Physical Exam: Vitals:   11/07/21 0800 11/07/21 0958 11/07/21 1336 11/07/21 1621  BP:   100/70 (!) 148/94  Pulse:   88 80  Resp:   18 20  Temp: (!) 102.5 F (39.2 C) (!) 100.4 F (38 C) 98.5 F (36.9 C) 98.3 F (36.8 C)  TempSrc: Axillary Axillary Axillary Oral  SpO2:    99%  Weight:       Neurology awake and alert ENT with mild pallor Cardiovascular with S1 and S2 present and rhythmic with no gallops, rubs or murmurs Respiratory with no rales or wheezing Abdomen with no distention  No lower extremity edema  Data Reviewed:    Family Communication: I spoke with patient's wife at the bedside, we talked in detail about patient's condition, plan of care and prognosis and all questions were addressed.   Disposition: Status is: Inpatient Remains inpatient appropriate because: sepsis   Planned Discharge Destination: Rehab     Author: Tawni Millers, MD 11/07/2021 4:48 PM  For on call review [email or web address].

## 2021-11-07 NOTE — Assessment & Plan Note (Signed)
>>  ASSESSMENT AND PLAN FOR MS (MULTIPLE SCLEROSIS) ([place]) WRITTEN ON 11/07/2021  4:47 PM BY Tawni Millers, MD  Patient has been recovering well in inpatient rehab Continue with PT and OT Trach and peg have been removed.

## 2021-11-07 NOTE — Assessment & Plan Note (Signed)
Systolic blood pressure 831 to 140 mmHg.  Plan to continue with metoprolol.  Continue close blood pressure monitoring.

## 2021-11-07 NOTE — Progress Notes (Signed)
Inpatient Rehabilitation Discharge Medication Review by a Pharmacist  A complete drug regimen review was completed for this patient to identify any potential clinically significant medication issues.  High Risk Drug Classes Is patient taking? Indication by Medication  Antipsychotic No   Anticoagulant No   Antibiotic No   Opioid No   Antiplatelet No   Hypoglycemics/insulin Yes Tresiba, glipizide, metformin for hyperglycemia  Vasoactive Medication Yes Doxazosin for BPH  Chemotherapy No   Other Yes GI prophylaxis: Protonix Hyperlipidemia: Crestor        Type of Medication Issue Identified Description of Issue Recommendation(s)  Drug Interaction(s) (clinically significant)     Duplicate Therapy     Allergy     No Medication Administration End Date     Incorrect Dose     Additional Drug Therapy Needed     Significant med changes from prior encounter (inform family/care partners about these prior to discharge).    Other       Clinically significant medication issues were identified that warrant physician communication and completion of prescribed/recommended actions by midnight of the next day:  No   Time spent performing this drug regimen review (minutes):  30   Thank you for allowing [place] to participate in this patients care.  Vaughan Basta BS, PharmD, BCPS Clinical Pharmacist 11/07/2021 12:08 PM  Contact: [phone] after 3 PM  "Be curious, not judgmental..." -Jamal Maes

## 2021-11-07 NOTE — Progress Notes (Signed)
? ?  Inpatient Rehab Admissions Coordinator : ? ?Per therapy recommendations, patient was screened for CIR candidacy by Vada Yellen RN MSN.  At this time patient appears to be a potential candidate for CIR. I will place a rehab consult per protocol for full assessment. Please call me with any questions. ? ?Saivon Prowse RN MSN ?Admissions Coordinator ?[phone] ?  ?

## 2021-11-07 NOTE — Progress Notes (Signed)
PHARMACY - PHYSICIAN COMMUNICATION CRITICAL VALUE ALERT - BLOOD CULTURE IDENTIFICATION (BCID)  Troy Randall is an 61 y.o. male who presented to [facility] on in August and was on inpatient rehab, recently transferred to acute  Assessment:  Ecoli bacteremia identified in 2 aerobic bottles.  No resistance detected  Name of physician (or Provider) Contacted: Dr. Cathlean Sauer notified  Current antibiotics: Ceftriaxone + metronidazole  Changes to prescribed antibiotics recommended: Continue ceftriaxone - may consider stopping metronidazole if GI source ruled out   Results for orders placed or performed during the hospital encounter of 10/30/21  Blood Culture ID Panel (Reflexed) (Collected: 11/06/2021  8:36 PM)  Result Value Ref Range   Enterococcus faecalis NOT DETECTED NOT DETECTED   Enterococcus Faecium NOT DETECTED NOT DETECTED   Listeria monocytogenes NOT DETECTED NOT DETECTED   Staphylococcus species NOT DETECTED NOT DETECTED   Staphylococcus aureus (BCID) NOT DETECTED NOT DETECTED   Staphylococcus epidermidis NOT DETECTED NOT DETECTED   Staphylococcus lugdunensis NOT DETECTED NOT DETECTED   Streptococcus species NOT DETECTED NOT DETECTED   Streptococcus agalactiae NOT DETECTED NOT DETECTED   Streptococcus pneumoniae NOT DETECTED NOT DETECTED   Streptococcus pyogenes NOT DETECTED NOT DETECTED   A.calcoaceticus-baumannii NOT DETECTED NOT DETECTED   Bacteroides fragilis NOT DETECTED NOT DETECTED   Enterobacterales DETECTED (A) NOT DETECTED   Enterobacter cloacae complex NOT DETECTED NOT DETECTED   Escherichia coli DETECTED (A) NOT DETECTED   Klebsiella aerogenes NOT DETECTED NOT DETECTED   Klebsiella oxytoca NOT DETECTED NOT DETECTED   Klebsiella pneumoniae NOT DETECTED NOT DETECTED   Proteus species NOT DETECTED NOT DETECTED   Salmonella species NOT DETECTED NOT DETECTED   Serratia marcescens NOT DETECTED NOT DETECTED   Haemophilus influenzae NOT DETECTED NOT DETECTED   Neisseria  meningitidis NOT DETECTED NOT DETECTED   Pseudomonas aeruginosa NOT DETECTED NOT DETECTED   Stenotrophomonas maltophilia NOT DETECTED NOT DETECTED   Candida albicans NOT DETECTED NOT DETECTED   Candida auris NOT DETECTED NOT DETECTED   Candida glabrata NOT DETECTED NOT DETECTED   Candida krusei NOT DETECTED NOT DETECTED   Candida parapsilosis NOT DETECTED NOT DETECTED   Candida tropicalis NOT DETECTED NOT DETECTED   Cryptococcus neoformans/gattii NOT DETECTED NOT DETECTED   CTX-M ESBL NOT DETECTED NOT DETECTED   Carbapenem resistance IMP NOT DETECTED NOT DETECTED   Carbapenem resistance KPC NOT DETECTED NOT DETECTED   Carbapenem resistance NDM NOT DETECTED NOT DETECTED   Carbapenem resist OXA 48 LIKE NOT DETECTED NOT DETECTED   Carbapenem resistance VIM NOT DETECTED NOT DETECTED    Candie Mile 11/07/2021  9:59 AM

## 2021-11-07 NOTE — Assessment & Plan Note (Addendum)
Hypokalemia Electrolytes have been corrected, renal function continue to be stable. Patient off IV fluids.

## 2021-11-07 NOTE — Consult Note (Signed)
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 male with complex medical history where he was originally admitted 8/31 for acute gi bleed requiring SMA embolization. He also was subsequently developed guillain-barre disease requiring intubation, received PLEX AND cholecystitis s/p perc chole drain by IR 9/3 and exchanged most recently on 10/20. He has been in rehab for hte past week doing PT/OT, and also discontinued his foley catheter on 11/2, however still having some urinary retention. He developed acute onset of fever, sinus tach/SVT thus underwent infectious work up and received IVF and readmitted to the [facility] for CIR. He had fever of 102.72F with leukocytosis of 16K,lactic acid of 3.2, infectious work up revealed e.coli bacteremia. He did not complain of dysuria, lower abdominal pain but mostly RUQ pain. He is clinically stable not requiring vasopressors. He was started empirically on ceftriaxone plus metronidazole. No CL or PIV with surrounding cellulitis noted on exam. His UA showed pyuria, no nitrates. CXR no infiltrates.RUQ U/S shows GB decompressed and drain in place.   Past Medical History:  Diagnosis Date   Anemia    Arthritis    CVA (cerebral vascular accident) ([place])    Diabetes mellitus without complication ([place])    Hyperlipidemia    Hypertension    Kidney stones     Allergies:  Allergies  Allergen Reactions    Metformin And Related     GI upset   Penicillins Hives    Current antibiotics:   MEDICATIONS:  famotidine  20 mg Oral BID   insulin aspart  0-9 Units Subcutaneous Q6H   insulin glargine-yfgn  12 Units Subcutaneous BID   lidocaine  1 patch Transdermal Q24H    Social History   Tobacco Use   Smoking status: Never   Smokeless tobacco: Current    Types: Snuff  Vaping Use   Vaping Use: Never used  Substance Use Topics   Alcohol use: No   Drug use: No    Family History  Problem Relation Age of Onset   Heart failure Mother    Diabetes Father    Cancer Other    Heart attack Other    Anesthesia problems Neg Hx    Hypotension Neg Hx    Malignant hyperthermia Neg Hx    Pseudochol deficiency Neg Hx    Colon cancer Neg Hx    Inflammatory bowel disease Neg Hx     Review of Systems -  Review of Systems  Constitutional: Negative for fever, chills, diaphoresis, activity change, appetite change, fatigue and unexpected weight change.  HENT: Negative for congestion, sore throat, rhinorrhea, sneezing, trouble swallowing and sinus pressure.  Eyes: Negative for photophobia and visual disturbance.  Respiratory: Negative for cough, chest tightness, shortness of breath, wheezing and stridor.  Cardiovascular: Negative for chest pain, palpitations and leg swelling.  Gastrointestinal: _RUQ pain. Negative for nausea, vomiting, diarrhea, constipation,  blood in stool, abdominal distention and anal bleeding.  Genitourinary: Negative for dysuria, hematuria, flank pain and difficulty urinating.  Musculoskeletal: Negative for myalgias, back pain, joint swelling, arthralgias and gait problem.  Skin: Negative for color change, pallor, rash and wound.  Neurological: Negative for dizziness, tremors, weakness and light-headedness.  Hematological: Negative for adenopathy. Does not bruise/bleed easily.  Psychiatric/Behavioral: Negative for behavioral problems, confusion, sleep disturbance, dysphoric mood,  decreased concentration and agitation.     OBJECTIVE: Temp:  [97.5 F (36.4 C)-102.5 F (39.2 C)] 98.3 F (36.8 C) (11/03 1621) Pulse Rate:  [80-158] 80 (11/03 1621) Resp:  [18-32] 20 (11/03 1621) BP: (100-159)/(52-96) 148/94 (11/03 1621) SpO2:  [92 %-100 %] 99 % (11/03 1621) Weight:  [119.8 kg-120.1 kg] 120.1 kg (11/03 0602) Physical Exam  Constitutional: He is oriented to person, place, and time. He appears well-developed and well-nourished. No distress.  HENT:  Mouth/Throat: Oropharynx is clear and moist. No oropharyngeal exudate.  Cardiovascular: Normal rate, regular rhythm and normal heart sounds. Exam reveals no gallop and no friction rub.  No murmur heard.  Pulmonary/Chest: Effort normal and breath sounds normal. No respiratory distress. He has no wheezes.  Abdominal: Soft. Bowel sounds are normal. He exhibits no distension. There is no tenderness.  Chole drain in place dark green fluid in collection system Neurological: He is alert and oriented to person, place, and time.  Skin: Skin is warm and dry. No rash noted. No erythema.  Psychiatric: He has a normal mood and affect. His behavior is normal.   LABS: Results for orders placed or performed during the hospital encounter of 11/06/21 (from the past 48 hour(s))  Glucose, capillary     Status: Abnormal   Collection Time: 11/06/21  8:50 PM  Result Value Ref Range   Glucose-Capillary 235 (H) 70 - 99 mg/dL    Comment: Glucose reference range applies only to samples taken after fasting for at least 8 hours.  Glucose, capillary     Status: Abnormal   Collection Time: 11/06/21 11:19 PM  Result Value Ref Range   Glucose-Capillary 202 (H) 70 - 99 mg/dL    Comment: Glucose reference range applies only to samples taken after fasting for at least 8 hours.  Lactic acid, plasma     Status: Abnormal   Collection Time: 11/07/21 12:39 AM  Result Value Ref Range   Lactic Acid, Venous 2.4 (HH) 0.5 - 1.9 mmol/L    Comment: CRITICAL  VALUE NOTED. VALUE IS CONSISTENT WITH PREVIOUSLY REPORTED/CALLED VALUE Performed at [facility], [place] [address] [place], [place] [address]   TSH     Status: None   Collection Time: 11/07/21 12:39 AM  Result Value Ref Range   TSH 1.057 0.350 - 4.500 uIU/mL    Comment: Performed by a 3rd Generation assay with a functional sensitivity of <=0.01 uIU/mL. Performed at [facility], [place] [address], [place], [place] [address]   Glucose, capillary     Status: Abnormal   Collection Time: 11/07/21  6:37 AM  Result Value Ref Range   Glucose-Capillary 145 (H) 70 - 99 mg/dL    Comment: Glucose reference range applies only to samples taken after fasting for at least 8 hours.   Comment 1 Notify RN    Comment 2 Document in Chart   Lactic acid, plasma     Status: None   Collection Time: 11/07/21  7:55 AM  Result Value Ref Range   Lactic Acid, Venous 1.5 0.5 - 1.9 mmol/L  Comment: Performed at [facility], [place] [address], [place], [place] [address]  Protime-INR     Status: Abnormal   Collection Time: 11/07/21  7:55 AM  Result Value Ref Range   Prothrombin Time 15.4 (H) 11.4 - 15.2 seconds   INR 1.2 0.8 - 1.2    Comment: (NOTE) INR goal varies based on device and disease states. Performed at [facility], [place] [address] [place], [place] [address]   Glucose, capillary     Status: Abnormal   Collection Time: 11/07/21 10:55 AM  Result Value Ref Range   Glucose-Capillary 117 (H) 70 - 99 mg/dL    Comment: Glucose reference range applies only to samples taken after fasting for at least 8 hours.  Glucose, capillary     Status: Abnormal   Collection Time: 11/07/21  4:25 PM  Result Value Ref Range   Glucose-Capillary 101 (H) 70 - 99 mg/dL    Comment: Glucose reference range applies only to samples taken after fasting for at least 8 hours.    MICRO: 11/2 blood cx BCID ecoli IMAGING: US ABDOMEN LIMITED RUQ (LIVER/GB)  Result Date: 11/07/2021 CLINICAL DATA:  Acute  cholecystitis. EXAM: ULTRASOUND ABDOMEN LIMITED RIGHT UPPER QUADRANT COMPARISON:  November 06, 2021. FINDINGS: Gallbladder: Presumably decompressed and not well visualized due to presence of drainage catheter. No abnormal wall thickness is noted. No sonographic Murphy's sign is noted. Common bile duct: Diameter: 5 mm which is within normal limits. Liver: No focal lesion identified. Within normal limits in parenchymal echogenicity. Portal vein is patent on color Doppler imaging with normal direction of blood flow towards the liver. Other: None. IMPRESSION: Patient apparently has percutaneous cholecystostomy in place. The gallbladder is not well visualized and presumably is decompressed secondary to the catheter. No other definite abnormality seen in the right upper quadrant of the abdomen. Electronically Signed   By: Marijo Conception M.D.   On: 11/07/2021 15:20   ECHOCARDIOGRAM LIMITED  Result Date: 11/07/2021    ECHOCARDIOGRAM LIMITED REPORT   Patient Name:   Troy Randall Date of Exam: 11/07/2021 Medical Rec #:  [id number]       Height:       72.0 in Accession #:    [id number]      Weight:       264.8 lb Date of Birth:  Jun 19, 1960       BSA:          2.400 m Patient Age:    66 years        BP:           131/76 mmHg Patient Gender: M               HR:           102 bpm. Exam Location:  Inpatient Procedure: 2D Echo, Cardiac Doppler and Color Doppler Indications:    atrial fibrillation  History:        Patient has prior history of Echocardiogram examinations, most                 recent 09/09/2021. Risk Factors:Diabetes, Hypertension and                 Dyslipidemia.  Sonographer:    Johny Chess RDCS Referring Phys: [id number] Rhetta Mura  Sonographer Comments: Image acquisition challenging due to uncooperative patient and Image acquisition challenging due to patient body habitus. IMPRESSIONS  1. Left ventricular ejection fraction, by estimation, is 70 to 75%.  The left ventricle has hyperdynamic function. The  left ventricle has no regional wall motion abnormalities. There is moderate concentric left ventricular hypertrophy.  2. Right ventricular systolic function is normal. The right ventricular size is normal.  3. The mitral valve is normal in structure. Trivial mitral valve regurgitation. No evidence of mitral stenosis.  4. The aortic valve is tricuspid. Aortic valve regurgitation is mild. Aortic valve sclerosis/calcification is present, without any evidence of aortic stenosis.  5. Aortic dilatation noted. There is mild dilatation of the ascending aorta, measuring 42 mm. FINDINGS  Left Ventricle: Left ventricular ejection fraction, by estimation, is 70 to 75%. The left ventricle has hyperdynamic function. The left ventricle has no regional wall motion abnormalities. The left ventricular internal cavity size was normal in size. There is moderate concentric left ventricular hypertrophy. Right Ventricle: The right ventricular size is normal. Right ventricular systolic function is normal. Left Atrium: Left atrial size was normal in size. Right Atrium: Right atrial size was normal in size. Pericardium: There is no evidence of pericardial effusion. Mitral Valve: The mitral valve is normal in structure. Trivial mitral valve regurgitation. No evidence of mitral valve stenosis. Tricuspid Valve: The tricuspid valve is normal in structure. Tricuspid valve regurgitation is not demonstrated. No evidence of tricuspid stenosis. Aortic Valve: The aortic valve is tricuspid. Aortic valve regurgitation is mild. Aortic valve sclerosis/calcification is present, without any evidence of aortic stenosis. Pulmonic Valve: The pulmonic valve was normal in structure. Pulmonic valve regurgitation is not visualized. No evidence of pulmonic stenosis. Aorta: Aortic dilatation noted. There is mild dilatation of the ascending aorta, measuring 42 mm. Venous: The inferior vena cava was not well visualized. IAS/Shunts: The interatrial septum was not well  visualized. LEFT VENTRICLE PLAX 2D LVIDd:         4.40 cm LVIDs:         2.60 cm LV PW:         1.40 cm LV IVS:        1.30 cm LVOT diam:     2.20 cm LV SV:         64 LV SV Index:   27 LVOT Area:     3.80 cm  LEFT ATRIUM         Index LA diam:    3.90 cm 1.62 cm/m  AORTIC VALVE LVOT Vmax:   107.00 cm/s LVOT Vmean:  72.000 cm/s LVOT VTI:    0.169 m  AORTA Ao Asc diam: 4.20 cm MV E velocity: 71.20 cm/s MV A velocity: 85.20 cm/s  SHUNTS MV E/A ratio:  0.84        Systemic VTI:  0.17 m                            Systemic Diam: 2.20 cm Kirk Ruths MD Electronically signed by Kirk Ruths MD Signature Date/Time: 11/07/2021/12:42:32 PM    Final    DG Chest Port 1 View  Result Date: 11/06/2021 CLINICAL DATA:  Fever EXAM: PORTABLE CHEST 1 VIEW COMPARISON:  Chest x-ray 10/05/2021 FINDINGS: The heart size and mediastinal contours are within normal limits. Both lungs are clear. The visualized skeletal structures are unremarkable. IMPRESSION: No active disease. Electronically Signed   By: Ronney Asters M.D.   On: 11/06/2021 20:11   IR CHOLANGIOGRAM EXISTING TUBE  Result Date: 11/06/2021 INDICATION: History of acute cholecystitis, post ultrasound fluoroscopic guided cholecystostomy tube placement in 09/08/2021. Most recent cholecystostomy exchange in 10/24/2021. EXAM: CHOLECYSTOSTOMY  TUBE INJECTION COMPARISON:  IR fluoroscopy, 09/08/2021 and 10/24/2021. CT AP, 10/16/2021. MEDICATIONS: None ANESTHESIA/SEDATION: None CONTRAST:  5 mL Omnipaque 300-administered into the gallbladder fossa. FLUOROSCOPY TIME:  Fluoroscopic dose; 4 mGy COMPLICATIONS: None immediate. PROCEDURE: The patient was positioned supine on the fluoroscopy table. A preprocedural spot fluoroscopic image was obtained of the RIGHT upper abdominal quadrant existing cholecystostomy tube. Multiple spot fluoroscopic radiographic images were obtained of the RIGHT upper abdominal quadrant an existing cholecystostomy tube following injection of a small amount  of contrast. Images were reviewed and discussed with the patient. The cholecystostomy tube was flushed with a small amount of saline and capped. A dressing was placed. The patient tolerated the procedure well without immediate postprocedural complication. FINDINGS: Preprocedural spot fluoroscopic image of the right upper abdominal quadrant demonstrates grossly unchanged positioning of the cholecystostomy tube with end coiled and locked overlying the expected location of the gallbladder fundus. Subsequent contrast injection demonstrates appropriate functionality of the cholecystostomy tube with brisk opacification of the gallbladder. Persistent occlusion of the cystic duct, without passage of contrast from the gallbladder and into the common bile duct. IMPRESSION: 1. Appropriately positioned and functioning cholecystostomy. No cholecystostomy drain exchange was performed. 2. No fluoroscopic evidence of cystic duct patency. Attention on follow-up. PLAN: The patient will return to V[facility] Inter[facility]iology (VIR) for routine drainage catheter evaluation and exchange in 8 weeks from most recent exchange. Michaelle Birks, MD [facility] Electronically Signed   By: Michaelle Birks M.D.   On: 11/06/2021 17:16     Assessment/Plan:  [id number] with Sepsis due to hospital acquired e.coli bacteremia of unclear etiology but suspect urinary vs. GI source. U/S suggests that cholecystomy tube is in correct place. He did have urinary retention likely for neurogenic source, hx of GBS.  - recommend to get abd/pelvic CT to ensure no other fluid collection that needs to be drain - continue with ceftriaxone 2gm iv daily - will follow up on cultures results  Leukocytosis = from underlying bacteremia, and suspect it will improve with fluid and appropriate abtx  Transaminitis = likely from hypoperfusion. Appears improving since fluid rehydration. U/S also reassuring no signs  of obstruction.  Dr Juleen China to see over the weekend

## 2021-11-07 NOTE — Assessment & Plan Note (Signed)
No clinical signs of recurrent GI bleeding.

## 2021-11-07 NOTE — Plan of Care (Signed)
Patient developed SVT with UTI/sepsis and was not able to complete the rehab program; discharged to acute

## 2021-11-07 NOTE — Assessment & Plan Note (Signed)
Calculated BMI 35,9

## 2021-11-07 NOTE — Assessment & Plan Note (Signed)
Patient has been recovering well in inpatient rehab Continue with PT and OT Trach and peg have been removed.

## 2021-11-07 NOTE — Assessment & Plan Note (Signed)
Hyperglycemia.  Continue glucose cover and monitoring with insulin sliding scale.  Continue with basal insulin 12 units bid.  Patient is tolerating po well.  Hid fasting glucose this am was 148 mg/dl

## 2021-11-07 NOTE — Assessment & Plan Note (Signed)
Elevated liver enzymes and hyper bilirubinemia  Imaging with abdominal US with contracted gallbladder. Cholangiography per tube with appropriate positioned and functioning cholecystectomy. No fluoroscopic evidence of cystic duct patency.  Follow up on CT abdomen and pelvis.   Liver profile is improving.   Continue supportive medical therapy.

## 2021-11-07 NOTE — Assessment & Plan Note (Signed)
Hold on statin therapy until improvement in liver profile.

## 2021-11-07 NOTE — Progress Notes (Signed)
Inpatient Rehabilitation Care Coordinator Discharge Note   Patient Details  Name: Troy Randall MRN: [id number] Date of Birth: [birth year]   Discharge location: D/c to acute due to medical reasons  Length of Stay: 7 days  Discharge activity level:    Home/community participation:    Patient response ZM:CEYEMV Literacy - How often do you need to have someone help you when you read instructions, pamphlets, or other written material from your doctor or pharmacy?: Never  Patient response [id number] Isolation - How often do you feel lonely or isolated from those around you?: Patient unable to respond  Services provided included: MD, RD, PT, OT, SLP, CM, Neuropsych, SW, Pharmacy, TR, Financial risk analyst Services:  Charity fundraiser Utilized: [employer]  Choices offered to/list presented to:    Follow-up services arranged:     Patient response to transportation need: Is the patient able to respond to transportation needs?: Yes In the past 12 months, has lack of transportation kept you from medical appointments or from getting medications?: No In the past 12 months, has lack of transportation kept you from meetings, work, or from getting things needed for daily living?: No    Comments (or additional information):  Patient/Family verbalized understanding of follow-up arrangements:     Individual responsible for coordination of the follow-up plan:    Confirmed correct DME delivered: Rana Snare 11/07/2021    Rana Snare

## 2021-11-07 NOTE — Assessment & Plan Note (Addendum)
Patient has converted to sinus rhythm Tolerating well metoprolol. Now off telemetry.  Out of bed to chair tid with meals, PT and OT.

## 2021-11-07 NOTE — Hospital Course (Addendum)
[name] was admitted to the hospital with the working diagnosis of SVT, in the setting of sepsis and gram negative bacteremia.   61 yo male with the past medical history of T2DM, hypertension, and dyslipidemia, who was transferred from inpatient rehab due to the development of SVT.  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 Hospitalization complicated with multiple sclerosis treated with high dose systemic corticosteroids and plasma exchange, he required mechanical ventilation, and ultimately tracheostomy and PEG tube placement. During his hospitalization he was diagnosed with cholecystitis and cholecystectomy tube was placed.  At the inpatient rehab foley catheter was removed on 11/06/21 at 11 am, with no further urine output. He developed tachycardia 150 to 160 bpm prompting his transfer to the hospital.  At the time of his transfer his blood pressure was 118/59, HR 130 to 128, RR 31 and 02 saturation 97%, lungs with no wheezing or rhonchi, heart with S1 and S2 present and tachycardic, abdomen with no distention, cholecystectomy tube in place, no lower extremity edema.   Na 140, K 3,9 Cl 102 bicarbonate 23, glucose 250 bun 14 cr 1,15  Mag 1.5  ALK p 408, AST 1,227, ALT  535 T Bil 1,7  Lactic acid 3,2 and 2,4  Wbc 14,0 hgb 9,8 plt 215   Urine SG 1,023, 100 protein, > 50 rbc, 0-5 rbc   Blood cultures positive for E coli 2 out 2 bottles   Chest radiograph with no cardiomegaly, hypoinflation but no infiltrates.   EKG 165 bpm, normal axis, normal intervals, SVT rhythm, with no significant ST segment or T wave changes.   Abdominal US with decompressed gallbladder, cholecystectomy tube in place, with no other abnormalities.   Patient was placed on AV blockade and antibiotic therapy  11/03 persistent urinary retention and foley cathter was reinserted.  11/04 Patient clinically improving, follow up CT abdomen and pelvis with no  signs of deep infection.  11/05 E coli in blood cultures and Klebsiella in urine cultures, pan sensitive, change antibiotic to cefazolin.  11/06 patient clinically improved and medically stable to be transfer to CIR.  11/07 no current indication for surgical intervention at this time, plan to follow up as outpatient, continue drain care and antibiotic therapy as planned.  

## 2021-11-07 NOTE — Inpatient Diabetes Management (Signed)
Inpatient Diabetes Program Recommendations  AACE/ADA: New Consensus Statement on Inpatient Glycemic Control (2015)  Target Ranges:  Prepandial:   less than 140 mg/dL      Peak postprandial:   less than 180 mg/dL (1-2 hours)      Critically ill patients:  140 - 180 mg/dL   Lab Results  Component Value Date   GLUCAP 145 (H) 11/07/2021   HGBA1C 5.1 10/27/2021    Review of Glycemic Control  Latest Reference Range & Units 11/06/21 06:40 11/06/21 11:35 11/06/21 16:59 11/06/21 18:07 11/06/21 20:50 11/06/21 23:19 11/07/21 06:37  Glucose-Capillary 70 - 99 mg/dL 118 (H) 165 (H) 199 (H) 178 (H) 235 (H) 202 (H) 145 (H)   Diabetes history: DM 2 Outpatient Diabetes medications: Glipizide 10 mg Daily, Metformin [phone] mg bid, Tresiba 20 units bid Current orders for Inpatient glycemic control:  Semglee 12 units bid Novolog 0-9 units Q6 hours  A1c 5.1% on 10/23  Inpatient Diabetes Program Recommendations:    -  Increase Semglee to 15 units bid -  Change frequency of Correction scale to Novolog 0-9 units tid + hs -  Add Novolog 2 units tid meal coverage if eating >50% of meals  Thanks,  Tama Headings RN, MSN, BC-ADM Inpatient Diabetes Coordinator Team Pager [phone] (8a-5p)

## 2021-11-07 NOTE — Progress Notes (Signed)
  Echocardiogram 2D Echocardiogram has been performed.  Troy Randall 11/07/2021, 9:53 AM

## 2021-11-07 NOTE — Evaluation (Signed)
Physical Therapy Evaluation Patient Details Name: Troy Randall MRN: [id number] DOB: May 06, 1960 Today's Date: 11/07/2021  History of Present Illness  The pt is a 61 yo male presenting 11/2 from AIR with SVT and HR sustaining in 160s. Also found to have UTI. Pt with prolonged hospitalization starting 09/04/21 due to GI bleed complicated by concern for Guillain-Barr that ultimately required intubation, trach placement, and plasma exchange. Pt d/c to AIR on 10/26. PMH includes: anemia, CVA, DM II, HLD, HTN, and kidney stones.   Clinical Impression  Pt in bed upon arrival of PT, agreeable to evaluation at this time. Prior to admission the pt was ambulating >150 ft with RW and minG, but needed increased cues for sequencing and following instructions. The pt now presents with limitations in functional mobility, strength, power, stability, command following, and problem solving. He was able to complete sit-stand transfers with min-modA but needed significant assist for stepping to chair due to impaired sequencing, problem solving, and command following. Pt also with noted deficits in standing balance, needing increased assist and BUE support for static standing due to increased sway. Will benefit from return to acute inpatient rehab when medically stable.    Recommendations for follow up therapy are one component of a multi-disciplinary discharge planning process, led by the attending physician.  Recommendations may be updated based on patient status, additional functional criteria and insurance authorization.  Follow Up Recommendations Acute inpatient rehab (3hours/day)      Assistance Recommended at Discharge Frequent or constant Supervision/Assistance  Patient can return home with the following  Two people to help with walking and/or transfers;Assistance with cooking/housework;Assist for transportation;Help with stairs or ramp for entrance;A lot of help with bathing/dressing/bathroom    Equipment  Recommendations Rolling walker (2 wheels)  Recommendations for Other Services  Rehab consult    Functional Status Assessment Patient has had a recent decline in their functional status and demonstrates the ability to make significant improvements in function in a reasonable and predictable amount of time.     Precautions / Restrictions Precautions Precautions: Fall Precaution Comments: R drain Restrictions Weight Bearing Restrictions: No      Mobility  Bed Mobility Overal bed mobility: Needs Assistance Bed Mobility: Supine to Sit, Sit to Supine     Supine to sit: Mod assist, HOB elevated Sit to supine: Mod assist   General bed mobility comments: modA to elevate trunk, modA to return LE to bed    Transfers Overall transfer level: Needs assistance Equipment used: Rolling walker (2 wheels) Transfers: Sit to/from Stand Sit to Stand: Mod assist, From elevated surface, Min assist   Step pivot transfers: Max assist       General transfer comment: initially modA to rise from EOB but progressed to minA with elevated surface or when in recliner with armrests and pt able to use BUE    Ambulation/Gait Ambulation/Gait assistance: Mod assist, +2 safety/equipment Gait Distance (Feet): 5 Feet (+ 3 ft) Assistive device: Rolling walker (2 wheels) Gait Pattern/deviations: Decreased stride length, Ataxic Gait velocity: decreased Gait velocity interpretation: <1.31 ft/sec, indicative of household ambulator   General Gait Details: imparied coordination, motor planning, and sequencing. pt stating "I am trying" at times and then walking in different directions than cued. significant sway with standing and stepping     Balance Overall balance assessment: Needs assistance Sitting-balance support: Feet supported Sitting balance-Leahy Scale: Fair Sitting balance - Comments: prefers UE support   Standing balance support: Bilateral upper extremity supported, During functional  activity Standing balance-Leahy  Scale: Poor Standing balance comment: Requires UE support in standing.                             Pertinent Vitals/Pain Pain Assessment Pain Assessment: Faces Faces Pain Scale: Hurts little more Pain Location: bottom when sitting Pain Descriptors / Indicators: Discomfort, Restless Pain Intervention(s): Limited activity within patient's tolerance, Monitored during session, Repositioned    Home Living Family/patient expects to be discharged to:: Inpatient rehab (AIR)                        Prior Function Prior Level of Function : Needs assist             Mobility Comments: walking with RW and assist at AIR, ADLs Comments: needing assist at AIR     Hand Dominance   Dominant Hand: Right    Extremity/Trunk Assessment   Upper Extremity Assessment Upper Extremity Assessment: Defer to OT evaluation    Lower Extremity Assessment Lower Extremity Assessment: Generalized weakness;Difficult to assess due to impaired cognition    Cervical / Trunk Assessment Cervical / Trunk Assessment: Normal  Communication   Communication: Expressive difficulties (saying incorrect words when answering questions)  Cognition Arousal/Alertness: Awake/alert Behavior During Therapy: Flat affect Overall Cognitive Status: Impaired/Different from baseline Area of Impairment: Attention, Memory, Following commands, Safety/judgement, Awareness, Problem solving                   Current Attention Level: Focused Memory: Decreased recall of precautions, Decreased short-term memory Following Commands: Follows one step commands inconsistently, Follows one step commands with increased time Safety/Judgement: Decreased awareness of safety, Decreased awareness of deficits Awareness: Intellectual Problem Solving: Slow processing, Decreased initiation, Difficulty sequencing, Requires verbal cues, Requires tactile cues General Comments: pt attempting  to follow commands, needing max cues and often tactile cues to complete. states "yeah" or "I am" but then does not follow through with instruction        General Comments General comments (skin integrity, edema, etc.): VSS on RA        Assessment/Plan    PT Assessment Patient needs continued PT services  PT Problem List Decreased strength;Decreased range of motion;Decreased mobility;Obesity;Decreased safety awareness;Decreased activity tolerance;Decreased balance;Decreased cognition;Cardiopulmonary status limiting activity       PT Treatment Interventions Therapeutic activities;Gait training;Patient/family education;Therapeutic exercise;DME instruction;Wheelchair mobility training;Neuromuscular re-education;Balance training    PT Goals (Current goals can be found in the Care Plan section)  Acute Rehab PT Goals Patient Stated Goal: to return to AIR PT Goal Formulation: With patient/family Time For Goal Achievement: 11/21/21 Potential to Achieve Goals: Good    Frequency Min 3X/week        AM-PAC PT "6 Clicks" Mobility  Outcome Measure Help needed turning from your back to your side while in a flat bed without using bedrails?: A Lot Help needed moving from lying on your back to sitting on the side of a flat bed without using bedrails?: A Lot Help needed moving to and from a bed to a chair (including a wheelchair)?: Total Help needed standing up from a chair using your arms (e.g., wheelchair or bedside chair)?: Total Help needed to walk in hospital room?: Total Help needed climbing 3-5 steps with a railing? : Total 6 Click Score: 8    End of Session Equipment Utilized During Treatment: Gait belt Activity Tolerance: Patient tolerated treatment well Patient left: in bed;with call bell/phone within reach;with bed alarm  set;with family/visitor present Nurse Communication: Mobility status PT Visit Diagnosis: Unsteadiness on feet (R26.81);Muscle weakness (generalized)  (M62.81);Difficulty in walking, not elsewhere classified (R26.2)    Time: 9381-8299 PT Time Calculation (min) (ACUTE ONLY): 43 min   Charges:   PT Evaluation $PT Eval Low Complexity: 1 Low PT Treatments $Gait Training: 8-22 mins $Therapeutic Exercise: 8-22 mins        Vickki Muff, PT, DPT   Acute Rehabilitation Department  Ronnie Derby 11/07/2021, 2:02 PM

## 2021-11-08 ENCOUNTER — Inpatient Hospital Stay (HOSPITAL_COMMUNITY): Payer: Medicare HMO

## 2021-11-08 DIAGNOSIS — I10 Essential (primary) hypertension: Secondary | ICD-10-CM | POA: Diagnosis not present

## 2021-11-08 DIAGNOSIS — Z8719 Personal history of other diseases of the digestive system: Secondary | ICD-10-CM | POA: Diagnosis not present

## 2021-11-08 DIAGNOSIS — I471 Supraventricular tachycardia, unspecified: Secondary | ICD-10-CM | POA: Diagnosis not present

## 2021-11-08 DIAGNOSIS — A4150 Gram-negative sepsis, unspecified: Secondary | ICD-10-CM | POA: Diagnosis not present

## 2021-11-08 LAB — BASIC METABOLIC PANEL
Anion gap: 6 (ref 5–15)
BUN: 13 mg/dL (ref 6–20)
CO2: 24 mmol/L (ref 22–32)
Calcium: 8.6 mg/dL — ABNORMAL LOW (ref 8.9–10.3)
Chloride: 108 mmol/L (ref 98–111)
Creatinine, Ser: 0.69 mg/dL (ref 0.61–1.24)
GFR, Estimated: >60 mL/min (ref >60)
Glucose, Bld: 151 mg/dL — ABNORMAL HIGH (ref 70–99)
Potassium: 4 mmol/L (ref 3.5–5.1)
Sodium: 138 mmol/L (ref 135–145)

## 2021-11-08 LAB — GLUCOSE, CAPILLARY
Glucose-Capillary: 126 mg/dL — ABNORMAL HIGH (ref 70–99)
Glucose-Capillary: 185 mg/dL — ABNORMAL HIGH (ref 70–99)
Glucose-Capillary: 165 mg/dL — ABNORMAL HIGH (ref 70–99)
Glucose-Capillary: 141 mg/dL — ABNORMAL HIGH (ref 70–99)

## 2021-11-08 LAB — URINE CULTURE: Culture: >=100000 — AB

## 2021-11-08 LAB — HEPATIC FUNCTION PANEL
ALT: 337 U/L — ABNORMAL HIGH (ref 0–44)
AST: 259 U/L — ABNORMAL HIGH (ref 15–41)
Albumin: 2.6 g/dL — ABNORMAL LOW (ref 3.5–5.0)
Alkaline Phosphatase: 330 U/L — ABNORMAL HIGH (ref 38–126)
Bilirubin, Direct: 0.5 mg/dL — ABNORMAL HIGH (ref 0.0–0.2)
Indirect Bilirubin: 0.5 mg/dL (ref 0.3–0.9)
Total Bilirubin: 1 mg/dL (ref 0.3–1.2)
Total Protein: 5.9 g/dL — ABNORMAL LOW (ref 6.5–8.1)

## 2021-11-08 LAB — CBC
HCT: 31.6 % — ABNORMAL LOW (ref 39.0–52.0)
Hemoglobin: 9.5 g/dL — ABNORMAL LOW (ref 13.0–17.0)
MCH: 27.2 pg (ref 26.0–34.0)
MCHC: 30.1 g/dL (ref 30.0–36.0)
MCV: 90.5 fL (ref 80.0–100.0)
Platelets: 211 10*3/uL (ref 150–400)
RBC: 3.49 MIL/uL — ABNORMAL LOW (ref 4.22–5.81)
RDW: 15 % (ref 11.5–15.5)
WBC: 6.2 10*3/uL (ref 4.0–10.5)
nRBC: 0 % (ref 0.0–0.2)

## 2021-11-08 MED ORDER — IOHEXOL 350 MG/ML SOLN
100.0000 mL | Freq: Once | INTRAVENOUS | Status: AC | PRN
  Administered 2021-11-08: 100 mL via INTRAVENOUS

## 2021-11-08 MED ORDER — HYDROXYZINE HCL 10 MG PO TABS
10.0000 mg | ORAL_TABLET | Freq: Three times a day (TID) | ORAL | Status: DC | PRN
  Administered 2021-11-08: 10 mg via ORAL
  Filled 2021-11-08: qty 1

## 2021-11-08 NOTE — Progress Notes (Signed)
Brief ID follow up note:   Patients urine cx = Kleb pna sensitive ceftriaxone Blood cx = E coli sensitivities pending WBC normalized today 24 hr Tmax = 98.6 LFTs markedly improved CT abd pelvis reassuring for gallbladder/biliary source.  Notable for patchy soft tissue density in the anterior omental fat possibly omental infarct.  Query if this could be related to E coli bacteremia but unsure of significance.  Continue ceftriaxone 2gm IV daily.  New ID team to follow up on Monday.   Raynelle Highland for Infectious [facility] 11/08/2021, 7:55 PM

## 2021-11-08 NOTE — TOC Progression Note (Signed)
Transition of Care [facility]) - Progression Note    Patient Details  Name: Troy Randall MRN: [id number] Date of Birth: [birth year]  Transition of Care [facility]) CM/SW Contact  Zenon Mayo, RN Phone Number: 11/08/2021, 3:26 PM  Clinical Narrative:    from CIR, lives at home with wife, temp 102.5,HR - SVT, resp eleavated, echo pending, abd u/s pending.  AMS, iv abx. CIR following again. TOC following.        Expected Discharge Plan and Services                                                 Social Determinants of Health (SDOH) Interventions    Readmission Risk Interventions     No data to display

## 2021-11-08 NOTE — Progress Notes (Addendum)
Progress Note   Patient: Troy Randall:[id number] DOB: 1960/11/07 DOA: 11/06/2021     1 DOS: the patient was seen and examined on 11/08/2021   Brief hospital course: Troy Randall was admitted to the hospital with the working diagnosis of SVT, in the setting of sepsis and gram negative bacteremia.   61 yo male with the past medical history of T2DM, hypertension, and dyslipidemia, who was transferred from inpatient rehab due to the development of SVT.  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 Hospitalization complicated with multiple sclerosis treated with high dose systemic corticosteroids and plasma exchange, he required mechanical ventilation, and ultimately tracheostomy and PEG tube placement. During his hospitalization he was diagnosed with cholecystitis and cholecystectomy tube was placed.  At the inpatient rehab foley catheter was removed on 11/06/21 at 11 am, with no further urine output. He developed tachycardia 150 to 160 bpm prompting his transfer to the hospital.  At the time of his transfer his blood pressure was 118/59, HR 130 to 128, RR 31 and 02 saturation 97%, lungs with no wheezing or rhonchi, heart with S1 and S2 present and tachycardic, abdomen with no distention, cholecystectomy tube in place, no lower extremity edema.   Na 140, K 3,9 Cl 102 bicarbonate 23, glucose 250 bun 14 cr 1,15  Mag 1.5  ALK p 408, AST 1,227, ALT  535 T Bil 1,7  Lactic acid 3,2 and 2,4  Wbc 14,0 hgb 9,8 plt 215   Urine SG 1,023, 100 protein, > 50 rbc, 0-5 rbc   Blood cultures positive for E coli 2 out 2 bottles   Chest radiograph with no cardiomegaly, hypoinflation but no infiltrates.   EKG 165 bpm, normal axis, normal intervals, SVT rhythm, with no significant ST segment or T wave changes.   Abdominal US with decompressed gallbladder, cholecystectomy tube in place, with no other abnormalities.   Patient was placed on AV  blockade and antibiotic therapy  11/03 persistent urinary retention and foley cathter was reinserted.  11/04 Patient clinically improving, follow up CT abdomen and pelvis with no signs of deep infection.    Assessment and Plan: * SVT (supraventricular tachycardia) Patient has converted to sinus rhythm Tolerating well metoprolol. Ok to discontinue telemetry monitoring.  Out of bed to chair tid with meals, PT and OT.    Sepsis due to gram-negative UTI (HCC) Severe sepsis present on admission (lactic acidosis). Gram negative bacteremia.  Urinary retention (present on admission).  Blood culture positive for E coli, urine culture positive for Klebsiella.  Patient has been afebrile for more than 24 hrs Wbc is down to 6.2  Pending follow up CT abdomen and pelvis per ID recommendations   Plan to continue IV ceftriaxone Follow up on sensitivities.    Essential hypertension Systolic blood pressure 735 to 140 mmHg.  Plan to continue metoprolol.  Continue close blood pressure monitoring.   History of acute cholecystitis Elevated liver enzymes and hyper bilirubinemia  Imaging with abdominal US with contracted gallbladder. Cholangiography per tube with appropriate positioned and functioning cholecystectomy. No fluoroscopic evidence of cystic duct patency.  Follow up on CT abdomen and pelvis.   Liver profile is improving.   Continue supportive medical therapy.   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   DM2 (diabetes mellitus, type 2) (HCC) Hyperglycemia.  Continue glucose cover and monitoring with  insulin sliding scale.  Continue with basal insulin 12 units bid.  Fasting glucose 151  Patient tolerating po well.   Hyperlipidemia Hold on statin therapy until improvement in liver profile.   Hypomagnesemia Hypokalemia  Renal function with serum cr at 0,69, K 4,0 and serum bicarbonate at  24.   Renal function stable, electrolytes have been corrected Patient tolerating po well, continue to hold on IV fluids.    Class 2 obesity Calculated BMI 35,9   MS (multiple sclerosis) ([place]) Patient has been recovering well in inpatient rehab Continue with PT and OT Trach and peg have been removed.         Subjective: Patient is feeling better, no nausea or vomiting, no abdominal pain  Physical Exam: Vitals:   11/07/21 2358 11/08/21 0501 11/08/21 0810 11/08/21 1138  BP: (!) 140/83 (!) 141/96 119/76 116/71  Pulse: 78 73 78 75  Resp: (!) _0 Temp: 98.6 F (37 C) 98.3 F (36.8 C) 98.5 F (36.9 C) 98.6 F (37 C)  TempSrc: Oral Oral Oral Oral  SpO2: 95% 98% 96% 96%  Weight:  121.6 kg     Neurology awake and alert ENT with mild pallor, no icterus Cardiovascular with S1 and S2 present and rhythmic with no gallops, rubs or murmurs Respiratory with no rales or wheezing Abdomen with no distention, drain in place.  No lower extremity edema.   Data Reviewed:    Family Communication: I spoke with patient's brother at the bedside, we talked in detail about patient's condition, plan of care and prognosis and all questions were addressed.   Disposition: Status is: Inpatient Remains inpatient appropriate because: IV antibiotic therapy, pending abdominal imaging.   Planned Discharge Destination: Rehab    Author: Tawni Millers, MD 11/08/2021 12:27 PM  For on call review [email or web address].

## 2021-11-08 NOTE — Evaluation (Signed)
Occupational Therapy Evaluation Patient Details Name: Troy Randall MRN: [id number] DOB: [birth year] Today's Date: 11/08/2021   History of Present Illness The pt is a 61 yo male presenting 11/2 from AIR with SVT and HR sustaining in 160s. Also found to have UTI. Pt with prolonged hospitalization starting 09/04/21 due to GI bleed complicated by concern for Guillain-Barr that ultimately required intubation, trach placement, and plasma exchange. Pt d/c to AIR on 10/26. PMH includes: anemia, CVA, DM II, HLD, HTN, and kidney stones.   Clinical Impression   Pt needing assist at baseline at AIR with ADLs and use of RW for mobility. Pt currently needing min-total A for ADLs, mod A for bed mobility, and mod A for transfers with RW. Pt with impaired cognition, needing increased verbal and tactile cues for safety and sequencing, however becomes easily frustrated, benefits from family (brother this session) present to assist. Pt presenting with impairments listed below, will follow acutely. Recommend AIR at d/c.     Recommendations for follow up therapy are one component of a multi-disciplinary discharge planning process, led by the attending physician.  Recommendations may be updated based on patient status, additional functional criteria and insurance authorization.   Follow Up Recommendations  Acute inpatient rehab (3hours/day)    Assistance Recommended at Discharge Frequent or constant Supervision/Assistance  Patient can return home with the following A lot of help with walking and/or transfers;A lot of help with bathing/dressing/bathroom;Assistance with cooking/housework;Direct supervision/assist for medications management;Help with stairs or ramp for entrance;Assist for transportation;Direct supervision/assist for financial management    Functional Status Assessment  Patient has had a recent decline in their functional status and/or demonstrates limited ability to make significant improvements in  function in a reasonable and predictable amount of time  Equipment Recommendations  None recommended by OT (defer)    Recommendations for Other Services PT consult     Precautions / Restrictions Precautions Precautions: Fall Precaution Comments: R drain Restrictions Weight Bearing Restrictions: No      Mobility Bed Mobility Overal bed mobility: Needs Assistance Bed Mobility: Supine to Sit, Sit to Supine     Supine to sit: Mod assist Sit to supine: Mod assist   General bed mobility comments: assisting with trunk elevation and BLE's    Transfers Overall transfer level: Needs assistance Equipment used: Rolling walker (2 wheels) Transfers: Sit to/from Stand Sit to Stand: Mod assist, From elevated surface, Min assist                  Balance Overall balance assessment: Needs assistance Sitting-balance support: Feet supported Sitting balance-Leahy Scale: Fair     Standing balance support: Bilateral upper extremity supported, During functional activity Standing balance-Leahy Scale: Poor Standing balance comment: Requires UE support in standing.                           ADL either performed or assessed with clinical judgement   ADL Overall ADL's : Needs assistance/impaired Eating/Feeding: Minimal assistance Eating/Feeding Details (indicate cue type and reason): brother holding water cup for pt to drink from Grooming: Minimal assistance   Upper Body Bathing: Moderate assistance   Lower Body Bathing: Moderate assistance   Upper Body Dressing : Moderate assistance   Lower Body Dressing: Maximal assistance   Toilet Transfer: Minimal assistance;Rolling walker (2 wheels);Ambulation;Regular Teacher, adult education Details (indicate cue type and reason): simulated via functional mobility Toileting- Clothing Manipulation and Hygiene: Total assistance Toileting - Clothing Manipulation Details (indicate cue type  and reason): catheter     Functional  mobility during ADLs: Minimal assistance;Rolling walker (2 wheels) General ADL Comments: pt's brother helpful for redirection/calming pt down when agitated     Vision   Additional Comments: will further assess     Perception     Praxis      Pertinent Vitals/Pain Pain Assessment Pain Assessment: Faces Pain Score: 4  Faces Pain Scale: Hurts little more Pain Location: bottom when laying on R side Pain Descriptors / Indicators: Discomfort, Restless Pain Intervention(s): Limited activity within patient's tolerance, Monitored during session, Repositioned     Hand Dominance Right   Extremity/Trunk Assessment Upper Extremity Assessment Upper Extremity Assessment: Generalized weakness (able to use functionally to get to EOB and grasp RW, will further assess)   Lower Extremity Assessment Lower Extremity Assessment: Defer to PT evaluation   Cervical / Trunk Assessment Cervical / Trunk Assessment: Normal   Communication Communication Communication: Expressive difficulties (delayed responses)   Cognition Arousal/Alertness: Awake/alert Behavior During Therapy: Flat affect Overall Cognitive Status: Impaired/Different from baseline Area of Impairment: Attention, Memory, Following commands, Safety/judgement, Awareness, Problem solving                   Current Attention Level: Focused Memory: Decreased recall of precautions, Decreased short-term memory Following Commands: Follows one step commands inconsistently, Follows one step commands with increased time Safety/Judgement: Decreased awareness of safety, Decreased awareness of deficits Awareness: Intellectual Problem Solving: Slow processing, Decreased initiation, Difficulty sequencing, Requires verbal cues, Requires tactile cues General Comments: pt easily frustrated, benefits from single step commands and brother present in room to assist. Pt unable to follow cues for "right and left" or "roll towards therapist" during bed  mobility, resulting in physical assist.     General Comments  VSS on RA, brother present and helpful    Exercises     Shoulder Instructions      Home Living Family/patient expects to be discharged to:: Inpatient rehab Living Arrangements: Spouse/significant other Available Help at Discharge: Family;Available 24 hours/day Type of Home: House Home Access: Stairs to enter [employer] of Steps: 2 Entrance Stairs-Rails: Left Home Layout: Able to live on main level with bedroom/bathroom;Laundry or work area in basement     [employer]: Occupational psychologist: Handicapped height Bathroom Accessibility: No          Lives With: Spouse    Prior Functioning/Environment Prior Level of Function : Needs assist             Mobility Comments: walking with RW and assist at AIR, ADLs Comments: needing assist at AIR        OT Problem List: Decreased strength;Decreased activity tolerance;Impaired balance (sitting and/or standing);Impaired vision/perception;Decreased cognition;Decreased coordination;Obesity;Impaired UE functional use;Cardiopulmonary status limiting activity      OT Treatment/Interventions: Self-care/ADL training;Therapeutic exercise;Therapeutic activities;Cognitive remediation/compensation;Patient/family education;Balance training    OT Goals(Current goals can be found in the care plan section) Acute Rehab OT Goals Patient Stated Goal: none stated OT Goal Formulation: With family Time For Goal Achievement: 11/22/21 Potential to Achieve Goals: Fair ADL Goals Pt Will Perform Upper Body Dressing: with min guard assist;standing;sitting Pt Will Perform Lower Body Dressing: with min assist;sitting/lateral leans;sit to/from stand Pt Will Transfer to Toilet: ambulating;regular height toilet;with modified independence Additional ADL Goal #1: Pt will follow 2 step command in prep for ADLs  OT Frequency: Min 2X/week    Co-evaluation               AM-PAC OT "6  Clicks" Daily Activity     Outcome Measure Help from another person eating meals?: A Little Help from another person taking care of personal grooming?: A Little Help from another person toileting, which includes using toliet, bedpan, or urinal?: A Lot Help from another person bathing (including washing, rinsing, drying)?: A Lot Help from another person to put on and taking off regular upper body clothing?: A Lot Help from another person to put on and taking off regular lower body clothing?: A Lot 6 Click Score: 14   End of Session Equipment Utilized During Treatment: Gait belt;Rolling walker (2 wheels) Nurse Communication: Mobility status  Activity Tolerance: Patient tolerated treatment well Patient left: in bed;with call bell/phone within reach;with family/visitor present;with bed alarm set  OT Visit Diagnosis: Muscle weakness (generalized) (M62.81);Other symptoms and signs involving cognitive function;Pain;Unsteadiness on feet (R26.81)                Time: 2671-2458 OT Time Calculation (min): 17 min Charges:  OT General Charges $OT Visit: 1 Visit OT Evaluation $OT Eval Moderate Complexity: 1 [address] OTD, OTR/L Acute Rehab [phone]  Mayer Masker 11/08/2021, 4:38 PM

## 2021-11-08 NOTE — Progress Notes (Signed)
Inpatient Rehab Admissions:  Inpatient Rehab Consult received.  I met with patient and brother Corene Cornea at the bedside for rehabilitation assessment and to discuss goals and expectations of an inpatient rehab admission.  Discussed average length of stay, discharge home after completion of CIR, and insurance authorization requirement for his insurance. Both acknowledged understanding. Pt interested in pursuing CIR again and brother supportive. Pt gave permission to contact wife Angie. Spoke with Angie on the telephone. She acknowledged understanding of CIR. She is supportive of pt pursuing CIR again. She confirmed that she will be able to provide 24/7 support for pt after discharge. Will continue to follow.  Signed: Gayland Curry, [place], [place] Admissions Coordinator [phone]

## 2021-11-08 NOTE — Evaluation (Signed)
Clinical/Bedside Swallow Evaluation Patient Details  Name: Troy Randall MRN: [id number] Date of Birth: 1960-04-08  Today's Date: 11/08/2021 Time: SLP Start Time (ACUTE ONLY): 1027 SLP Stop Time (ACUTE ONLY): 1042 SLP Time Calculation (min) (ACUTE ONLY): 15 min  Past Medical History:  Past Medical History:  Diagnosis Date   Anemia    Arthritis    CVA (cerebral vascular accident) (HCC)    Diabetes mellitus without complication (HCC)    Hyperlipidemia    Hypertension    Kidney stones    Past Surgical History:  Past Surgical History:  Procedure Laterality Date   ANKLE SURGERY     BIOPSY  09/10/2021   Procedure: BIOPSY;  Surgeon: Jeani Hawking, MD;  Location: [facility] ENDOSCOPY;  Service: Gastroenterology;;   BIOPSY  10/07/2021   Procedure: BIOPSY;  Surgeon: Jeani Hawking, MD;  Location: [facility] ENDOSCOPY;  Service: Gastroenterology;;   COLONOSCOPY N/A 10/07/2021   Procedure: COLONOSCOPY;  Surgeon: Jeani Hawking, MD;  Location: [facility] ENDOSCOPY;  Service: Gastroenterology;  Laterality: N/A;   COLONOSCOPY WITH PROPOFOL N/A 09/06/2021   Procedure: COLONOSCOPY WITH PROPOFOL;  Surgeon: Dolores Frame, MD;  Location: [facility] ENDO SUITE;  Service: Gastroenterology;  Laterality: N/A;   ENTEROSCOPY N/A 09/10/2021   Procedure: ENTEROSCOPY;  Surgeon: Jeani Hawking, MD;  Location: [facility] ENDOSCOPY;  Service: Gastroenterology;  Laterality: N/A;   ENTEROSCOPY  09/06/2021   Procedure: ENTEROSCOPY;  Surgeon: Dolores Frame, MD;  Location: [facility] ENDO SUITE;  Service: Gastroenterology;;   ESOPHAGOGASTRODUODENOSCOPY (EGD) WITH PROPOFOL  09/06/2021   Procedure: ESOPHAGOGASTRODUODENOSCOPY (EGD) WITH PROPOFOL;  Surgeon: Dolores Frame, MD;  Location: [facility] ENDO SUITE;  Service: Gastroenterology;;   Wenda Low SIGMOIDOSCOPY N/A 09/19/2021   Procedure: Arnell Sieving;  Surgeon: Jeani Hawking, MD;  Location: [facility] ENDOSCOPY;  Service: Gastroenterology;  Laterality: N/A;   FOREIGN BODY REMOVAL  09/19/2021    Procedure: FOREIGN BODY REMOVAL;  Surgeon: Jeani Hawking, MD;  Location: [facility] ENDOSCOPY;  Service: Gastroenterology;;   Emelda Brothers CAPSULE STUDY  09/06/2021   Procedure: GIVENS CAPSULE STUDY;  Surgeon: Dolores Frame, MD;  Location: [facility] ENDO SUITE;  Service: Gastroenterology;;   HERNIA REPAIR     umbilical x1 Incisional x1   INCISIONAL HERNIA REPAIR  04/13/2011   Procedure: LAPAROSCOPIC INCISIONAL HERNIA;  Surgeon: Dalia Heading, MD;  Location: [facility] ORS;  Service: General;  Laterality: N/A;  Recurrent Laparoscopic Incisional Herniorraphy with Mesh   IR ANGIOGRAM SELECTIVE EACH ADDITIONAL VESSEL  09/09/2021   IR ANGIOGRAM VISCERAL SELECTIVE  09/07/2021   IR ANGIOGRAM VISCERAL SELECTIVE  09/06/2021   IR ANGIOGRAM VISCERAL SELECTIVE  09/06/2021   IR CHOLANGIOGRAM EXISTING TUBE  11/06/2021   IR EMBO ART  VEN HEMORR LYMPH EXTRAV  INC GUIDE ROADMAPPING  09/06/2021   IR EXCHANGE BILIARY DRAIN  10/24/2021   IR GUIDED DRAIN W CATHETER PLACEMENT  09/07/2021   IR US GUIDE BX ASP/DRAIN  09/07/2021   IR US GUIDE VASC ACCESS RIGHT  09/07/2021   IR US GUIDE VASC ACCESS RIGHT  09/06/2021   KIDNEY STONE SURGERY     HPI:  The pt is a 61 yo male presenting 11/2 from AIR with SVT and HR sustaining in 160s. Also found to have UTI. Pt with prolonged hospitalization starting 09/04/21 due to GI bleed that ultimately required intubation, trach/PEG placement (both subsequently D/Cd), and plasma exchange. Pt d/c to AIR on 10/26. PMH includes: anemia, CVAx2 (most recent 3/23), DM II, HLD, HTN, and kidney stones.  While on AIR he was being treated for cognitive-linguistic and swallowing  impairments. He had just recently been advanced from nectar-thick to thin liquids (10/28).    Assessment / Plan / Recommendation  Clinical Impression  Pt participated in clinical swallowing assessment. His brother was at the bedside. Oral mechanism exam was normal. He was alert, communicative, confused.  He demonstrated prolonged but functional mastication  of regular solids, requiring occasional liquid wash to clear oral cavity. There were no s/s of aspiration when drinking thin liquids both in isolation and when combined with solids.  Per his brother, the pt has been taking his meds whole while in AIR.  Recommend that Mr. Dayrit continue a dysphagia 3 diet while readmitted to acute care. He may drink thin liquids and can take his pills whole with puree.  He will benefit from resuming cognitive/linguistic tx when he D/Cs back to AIR. No further acute care SLP f/u is needed, however. SLP Visit Diagnosis: Dysphagia, oropharyngeal phase (R13.12)    Aspiration Risk  No limitations    Diet Recommendation     Medication Administration: Whole meds with puree    Other  Recommendations Oral Care Recommendations: Oral care BID    Recommendations for follow up therapy are one component of a multi-disciplinary discharge planning process, led by the attending physician.  Recommendations may be updated based on patient status, additional functional criteria and insurance authorization.  Follow up Recommendations Acute inpatient rehab (3hours/day)                                  Swallow Study   General HPI: The pt is a 61 yo male presenting 11/2 from AIR with SVT and HR sustaining in 160s. Also found to have UTI. Pt with prolonged hospitalization starting 09/04/21 due to GI bleed that ultimately required intubation, trach/PEG placement (both subsequently D/Cd), and plasma exchange. Pt d/c to AIR on 10/26. PMH includes: anemia, CVAx2 (most recent 3/23), DM II, HLD, HTN, and kidney stones.  While on AIR he was being treated for cognitive-linguistic and swallowing impairments. He had just recently been advanced from nectar-thick to thin liquids (10/28). Type of Study: Bedside Swallow Evaluation Previous Swallow Assessment: 10/13 FEES and participation in dysphagia tx while on AIR Diet Prior to this Study: Dysphagia 3 (soft);Nectar-thick  liquids Temperature Spikes Noted: No Respiratory Status: Room air Behavior/Cognition: Alert;Cooperative;Pleasant mood;Confused;Requires cueing;Distractible Oral Cavity Assessment: Within Functional Limits Oral Care Completed by SLP: No Oral Cavity - Dentition: Adequate natural dentition Vision: Functional for self-feeding Self-Feeding Abilities: Needs assist Patient Positioning: Upright in bed Baseline Vocal Quality: Normal Volitional Cough: Strong Volitional Swallow: Able to elicit    Oral/Motor/Sensory Function Overall Oral Motor/Sensory Function: Within functional limits   Ice Chips Ice chips: Not tested   Thin Liquid Thin Liquid: Within functional limits    Nectar Thick Nectar Thick Liquid: Not tested   Honey Thick Honey Thick Liquid: Within functional limits   Puree Puree: Within functional limits   Solid     Solid: Impaired Oral Phase Functional Implications: Prolonged oral transit      Juan Quam Laurice 11/08/2021,10:52 AM  Estill Bamberg L. Tivis Ringer, MA CCC/SLP Clinical Specialist - [place] Office number [phone]

## 2021-11-08 NOTE — Progress Notes (Signed)
Physical Therapy Note  Patient Details  Name: Troy Randall MRN: [id number] Date of Birth: 1960-02-21 Today's Date: 11/08/2021    Physical Therapy Discharge Note  This patient was unable to complete the inpatient rehab program due to medical complications; therefore did not meet their long term goals. Pt left the program at a mod assist level for their functional mobility/ transfers using RW. This patient is being discharged from PT services at this time.  Pt's perception of pain in the last five days was unable to answer at this time.   See [employer] for functional status details  If the patient is able to return to inpatient rehabilitation within 3 midnights, this may be considered an interrupted stay and therapy services will resume as ordered. Modification and reinstatement of their goals will be made upon completion of therapy service reevaluations.    Troy Randall 11/08/2021, 9:33 AM

## 2021-11-09 DIAGNOSIS — I471 Supraventricular tachycardia, unspecified: Secondary | ICD-10-CM | POA: Diagnosis not present

## 2021-11-09 DIAGNOSIS — A4150 Gram-negative sepsis, unspecified: Secondary | ICD-10-CM | POA: Diagnosis not present

## 2021-11-09 DIAGNOSIS — I10 Essential (primary) hypertension: Secondary | ICD-10-CM | POA: Diagnosis not present

## 2021-11-09 DIAGNOSIS — Z8719 Personal history of other diseases of the digestive system: Secondary | ICD-10-CM | POA: Diagnosis not present

## 2021-11-09 LAB — CULTURE, BLOOD (ROUTINE X 2)
Special Requests: ADEQUATE
Special Requests: ADEQUATE

## 2021-11-09 LAB — COMPREHENSIVE METABOLIC PANEL
ALT: 266 U/L — ABNORMAL HIGH (ref 0–44)
AST: 130 U/L — ABNORMAL HIGH (ref 15–41)
Albumin: 2.6 g/dL — ABNORMAL LOW (ref 3.5–5.0)
Alkaline Phosphatase: 321 U/L — ABNORMAL HIGH (ref 38–126)
Anion gap: 12 (ref 5–15)
BUN: 7 mg/dL (ref 6–20)
CO2: 23 mmol/L (ref 22–32)
Calcium: 8.8 mg/dL — ABNORMAL LOW (ref 8.9–10.3)
Chloride: 103 mmol/L (ref 98–111)
Creatinine, Ser: 0.62 mg/dL (ref 0.61–1.24)
GFR, Estimated: >60 mL/min (ref >60)
Glucose, Bld: 131 mg/dL — ABNORMAL HIGH (ref 70–99)
Potassium: 4 mmol/L (ref 3.5–5.1)
Sodium: 138 mmol/L (ref 135–145)
Total Bilirubin: 0.8 mg/dL (ref 0.3–1.2)
Total Protein: 5.8 g/dL — ABNORMAL LOW (ref 6.5–8.1)

## 2021-11-09 LAB — CBC
HCT: 31.5 % — ABNORMAL LOW (ref 39.0–52.0)
Hemoglobin: 9.6 g/dL — ABNORMAL LOW (ref 13.0–17.0)
MCH: 26.8 pg (ref 26.0–34.0)
MCHC: 30.5 g/dL (ref 30.0–36.0)
MCV: 88 fL (ref 80.0–100.0)
Platelets: 213 10*3/uL (ref 150–400)
RBC: 3.58 MIL/uL — ABNORMAL LOW (ref 4.22–5.81)
RDW: 15 % (ref 11.5–15.5)
WBC: 5 10*3/uL (ref 4.0–10.5)
nRBC: 0 % (ref 0.0–0.2)

## 2021-11-09 LAB — GLUCOSE, CAPILLARY
Glucose-Capillary: 107 mg/dL — ABNORMAL HIGH (ref 70–99)
Glucose-Capillary: 208 mg/dL — ABNORMAL HIGH (ref 70–99)
Glucose-Capillary: 141 mg/dL — ABNORMAL HIGH (ref 70–99)
Glucose-Capillary: 169 mg/dL — ABNORMAL HIGH (ref 70–99)

## 2021-11-09 MED ORDER — CHLORHEXIDINE GLUCONATE CLOTH 2 % EX PADS
6.0000 | MEDICATED_PAD | Freq: Every day | CUTANEOUS | Status: DC
  Administered 2021-11-09 – 2021-11-12 (×4): 6 via TOPICAL

## 2021-11-09 MED ORDER — CEFAZOLIN SODIUM-DEXTROSE 2-4 GM/100ML-% IV SOLN
2.0000 g | Freq: Three times a day (TID) | INTRAVENOUS | Status: DC
  Administered 2021-11-09 – 2021-11-11 (×5): 2 g via INTRAVENOUS
  Filled 2021-11-09 (×6): qty 100

## 2021-11-09 MED ORDER — INSULIN ASPART 100 UNIT/ML IJ SOLN: 0.0000 [IU] | Freq: Every day | INTRAMUSCULAR | Status: DC

## 2021-11-09 MED ORDER — INSULIN ASPART 100 UNIT/ML IJ SOLN
0.0000 [IU] | Freq: Three times a day (TID) | INTRAMUSCULAR | Status: DC
  Administered 2021-11-10 – 2021-11-11 (×4): 2 [IU] via SUBCUTANEOUS
  Administered 2021-11-11: 1 [IU] via SUBCUTANEOUS
  Administered 2021-11-12: 3 [IU] via SUBCUTANEOUS

## 2021-11-09 NOTE — PMR Pre-admission (Signed)
PMR Admission Coordinator Pre-Admission Assessment  Patient: Troy Randall is an 61 y.o., male MRN: [id number] DOB: Oct 24, 1960 Height: 6'  Weight: 116.3 kg  Insurance Information HMO:     PPO: yes     PCP:      IPA:      80/20:      OTHER:  PRIMARY: [employer] Medicare      Policy#: [id number]      Subscriber: patient CM Name: [name]      Phone#: [phone]     Fax#: [phone] Pre-Cert#: [id number]    Received authorization from [place] on 11/11/21. Pt approved from 11/11/21-11/17/21. Updates due 11/18/21  Employer:  Benefits:  Phone #: [phone]     Name:  Eff. Date: 01/06/20-still active     Deduct: does not have Out of Pocket Max: $5,900 ($522.70 met)      Life Max: NA CIR: $375/day co-pay with a max co-pay of $1,875/admission      SNF: 100% coverage for days 1-20, $196/day co-pay for days 21-100 Outpatient: $35/visit co-pay     Co-Pay:  Home Health: 80% coverage      Co-Pay: 20% co-insurance DME: 80% coverage     Co-Pay: 20% co-insurance Providers: in-network SECONDARY:       Policy#:      Phone#:   Development worker, community:       Phone#:   The Engineer, petroleum" for patients in Inpatient Rehabilitation Facilities with attached "Privacy Act [place] Records" was provided and verbally reviewed with: Patient and Family  Emergency Contact Information Contact Information     Name Relation Home Work Mobile   [place] Spouse   [phone]       Current Medical History  Patient Admitting Diagnosis: Debility, Sepsis, SVT  History of Present Illness: Pt is a 61 year old male with medical hx significant for: lower GI bleed, recent diagnosis of acute cholecystitis s/p potscholecystostomy tube (09/07/21), DM II, HTN, hyperlipidemia. Pt was recently admitted to CIR from 10/30/21-11/06/21 after prolonged hospitalization. Pt originally presented to [facility] on 09/04/21 d/t acute lower GI bleed; pt is s/p coil embolization of jejunal branch of  SMA on 09/06/21. Due to concern for Gulliain-Barre disease, pt was transferred to [facility]. Pt was intubated, trached, and underwent treatment that included plasma exchange. Hospitalization complicated by diagnosis of acute cholecystitis; pt is s/p percutaneous cholecystostomy drain (09/07/21). Cholecystostomy tube was exchanged by IR on 10/24/21.In CIR, pt's foley catheter removed and pt was unable to urinate after that time. Pt developed tachycardia with  heart rates in 150s-160s with associated EKG suggestive of SVT. Pt was febrile. Rapid response called d/t SVT. Pt transferred back to acute care of [facility]. Chest x-ray showed no acute cardiopulmonary process. Blood cultures positive for E.coli. Urine culture positive for Klebsiella. Abdominal US with decompressed gallbladder, cholecystectomy tube in place, with no other abnormalities. Pt diagnosed with severe sepsis d/t UTI. On 11/3 foley catheter reinserted d/t persistent urinary retention.Therapy evaluations completed and CIR recommended d/t pt's deficits in functional mobility and inability to complete ADLs independently.  Patient's medical record from [facility] has been reviewed by the rehabilitation admission coordinator and physician.  Past Medical History  Past Medical History:  Diagnosis Date   Anemia    Arthritis    CVA (cerebral vascular accident) ([place])    Diabetes mellitus without complication ([place])    Hyperlipidemia    Hypertension    Kidney stones     Has the  patient had major surgery during 100 days prior to admission? Yes  Family History   family history includes Cancer in an other family member; Diabetes in his father; Heart attack in an other family member; Heart failure in his mother.  Current Medications  Current Facility-Administered Medications:    acetaminophen (TYLENOL) tablet 650 mg, 650 mg, Oral, Q6H PRN, 650 mg at 11/10/21 0620 **OR** acetaminophen (TYLENOL) suppository 650 mg, 650  mg, Rectal, Q6H PRN, Howerter, Justin B, DO, 650 mg at 11/07/21 0850   amoxicillin-clavulanate (AUGMENTIN) 875-125 MG per tablet 1 tablet, 1 tablet, Oral, Q12H, Rosiland Oz, MD, 1 tablet at 11/12/21 0947   Chlorhexidine Gluconate Cloth 2 % PADS 6 each, 6 each, Topical, Daily, Arrien, Jimmy Picket, MD, 6 each at 11/12/21 0946   famotidine (PEPCID) tablet 20 mg, 20 mg, Oral, BID, Howerter, Justin B, DO, 20 mg at 11/12/21 0946   hydrOXYzine (ATARAX) tablet 10 mg, 10 mg, Oral, TID PRN, Arrien, Jimmy Picket, MD, 10 mg at 11/08/21 1730   insulin aspart (novoLOG) injection 0-9 Units, 0-9 Units, Subcutaneous, TID WC, Hall, Carole N, DO, 2 Units at 11/11/21 1737   insulin glargine-yfgn (SEMGLEE) injection 12 Units, 12 Units, Subcutaneous, BID, Howerter, Justin B, DO, 12 Units at 11/12/21 0947   metoprolol tartrate (LOPRESSOR) tablet 25 mg, 25 mg, Oral, BID, Arrien, Jimmy Picket, MD, 25 mg at 11/12/21 0947   prochlorperazine (COMPAZINE) injection 5 mg, 5 mg, Intravenous, Q6H PRN, Irene Pap N, DO  Patients Current Diet:  Diet Order             Diet - low sodium heart healthy           DIET DYS 3 Room service appropriate? Yes with Assist; Fluid consistency: Thin  Diet effective now                   Precautions / Restrictions Precautions Precautions: Fall Precaution Comments: R drain Restrictions Weight Bearing Restrictions: No   Has the patient had 2 or more falls or a fall with injury in the past year? No  Prior Activity Level Community (5-7x/wk): drives, gets out of house daily  Prior Functional Level Self Care: Did the patient need help bathing, dressing, using the toilet or eating? Independent  Indoor Mobility: Did the patient need assistance with walking from room to room (with or without device)? Independent  Stairs: Did the patient need assistance with internal or external stairs (with or without device)? Independent  Functional Cognition: Did the patient  need help planning regular tasks such as shopping or remembering to take medications? Independent  Patient Information Are you of Hispanic, Latino/a,or Spanish origin?: A. No, not of Hispanic, Latino/a, or Spanish origin What is your race?: A. White Do you need or want an interpreter to communicate with a doctor or health care staff?: 0. No  Patient's Response To:  Health Literacy and Transportation Is the patient able to respond to health literacy and transportation needs?: Yes Health Literacy - How often do you need to have someone help you when you read instructions, pamphlets, or other written material from your doctor or pharmacy?: Never In the past 12 months, has lack of transportation kept you from medical appointments or from getting medications?: No In the past 12 months, has lack of transportation kept you from meetings, work, or from getting things needed for daily living?: No  Home Assistive Devices / Equipment    Prior Device Use: Indicate devices/aids used by the patient prior to  current illness, exacerbation or injury? None of the above  Current Functional Level Cognition  Overall Cognitive Status: Impaired/Different from baseline Current Attention Level: Focused Orientation Level: Oriented to person, Oriented to place Following Commands: Follows one step commands inconsistently, Follows one step commands with increased time Safety/Judgement: Decreased awareness of safety, Decreased awareness of deficits General Comments: pt attempting to follow commands, needing increased time and cues to complete. states "yeah" or "I am" but then does not follow through with instruction at times. Improved sequencing of familiar tasks, poor attention to L side that progressed with fatigue.    Extremity Assessment (includes Sensation/Coordination)  Upper Extremity Assessment: Overall WFL for tasks assessed LUE Deficits / Details: Pt uses R UE more for adls and is R handed. Pt sat on L  hand and did not seem to know it was underneath him.  Lower Extremity Assessment: Defer to PT evaluation    ADLs  Overall ADL's : Needs assistance/impaired Eating/Feeding: Minimal assistance, Sitting Eating/Feeding Details (indicate cue type and reason): Pt used spoon and fork to feed self potatoes and sandwhich during this session sitting on EOB. Walked in to wife feeding pt and spoke to her about letting pt feed self as he is able and this is a great tasks to feel independent with and to work on cognition with. Grooming: Oral care, Wash/dry hands, Minimal assistance, Standing Grooming Details (indicate cue type and reason): Pt stood at sink with min assist to occasional mod assist with posterior lean and R lean a times. Pt required step by step instructions as pt was distrtacted by his walker and other thins in room outside of task at hand. Pt with mild perseveration at end of tasks with cleaning toothbrush and needed one VC to put the brush down. Upper Body Bathing: Moderate assistance Lower Body Bathing: Moderate assistance Upper Body Dressing : Moderate assistance Lower Body Dressing: Maximal assistance Toilet Transfer: Minimal assistance, Rolling walker (2 wheels), Ambulation, Regular Toilet Toilet Transfer Details (indicate cue type and reason): simulated via functional mobility Toileting- Clothing Manipulation and Hygiene: Total assistance Toileting - Clothing Manipulation Details (indicate cue type and reason): catheter Functional mobility during ADLs: Moderate assistance, Rolling walker (2 wheels) General ADL Comments: Pt required mod assist to mobilize today due to running into items on L side in hallway. See PT note.    Mobility  Overal bed mobility: Needs Assistance Bed Mobility: Supine to Sit Supine to sit: Mod assist, HOB elevated Sit to supine: Mod assist General bed mobility comments: modA to initiate movement of LE, then pt able to elevate trunk from elevated HOB. increased  time an d cues.  Pt had difficult time moving up laterally in bed. Unable to follow cues to do so.  Required max assist to scoot up in bed on first trial but once pt knew what we were doing pt moved up with min assist.    Transfers  Overall transfer level: Needs assistance Equipment used: Rolling walker (2 wheels) Transfers: Sit to/from Stand Sit to Stand: Min assist, Min guard Bed to/from chair/wheelchair/BSC transfer type:: Step pivot Step pivot transfers: Mod assist General transfer comment: minA of 1-2 to rise from EOB, minA-minG to rise to standing from armchair. pt needing increased cues, assist, and assist to manage RW with pivot to return to sitting EOB    Ambulation / Gait / Stairs / Wheelchair Mobility  Ambulation/Gait Ambulation/Gait assistance: Mod assist, +2 safety/equipment, Min assist Gait Distance (Feet): 10 Feet (+ 35 ft + 15 ft +  50 ft) Assistive device: Rolling walker (2 wheels) Gait Pattern/deviations: Decreased stride length, Ataxic, Staggering left, Drifts right/left, Narrow base of support General Gait Details: R knee buckling/hyperextending at times, min-modA to maintain upright. drifts to L and needed assist to avoid obstacles. poor visual scanning Gait velocity: decreased Gait velocity interpretation: <1.31 ft/sec, indicative of household ambulator    Posture / Balance Dynamic Sitting Balance Sitting balance - Comments: prefers UE support Balance Overall balance assessment: Needs assistance Sitting-balance support: Feet supported Sitting balance-Leahy Scale: Fair Sitting balance - Comments: prefers UE support Postural control: Right lateral lean Standing balance support: Bilateral upper extremity supported, During functional activity Standing balance-Leahy Scale: Poor Standing balance comment: Requires UE support in standing. R knee hyperextending and leaning to R with attempts at unsupported standing    Special needs/care consideration Skin Pressure injury:  sacrum/medial; Irritant Dermatitis: rectum; Abrasion: arm/left; Erythema/Redness: buttocks/bilateral; Blister: buttocks/lower; Rash: buttocks/bilateral; Ecchymosis: Abdomen/bilateral; Petechiae: leg/proximal, medial and Diabetic management novoLOG 0-9 units 3x daily at meals; Semglee 12 units 2x daily, Bowel incontinence, Biliary tube, Urethral catheter   Previous Home Environment (from acute therapy documentation) Living Arrangements: Spouse/significant other  Lives With: Spouse Available Help at Discharge: Family, Available 24 hours/day Type of Home: House Home Layout: Able to live on main level with bedroom/bathroom, Laundry or work area in basement Home Access: Stairs to enter Entrance Stairs-Rails: Horticulturist, commercial of Steps: 2 Bathroom Shower/Tub: Multimedia programmer: Handicapped height Bathroom Accessibility: No  Discharge Living Setting Plans for Discharge Living Setting: Patient's home Type of Home at Discharge: House Discharge Home Layout: Able to live on main level with bedroom/bathroom, Laundry or work area in basement Discharge Home Access: Stairs to enter Entrance Stairs-Rails: Horticulturist, commercial of Steps: 2 Discharge Bathroom Shower/Tub: Walk-in shower Discharge Bathroom Toilet: Handicapped height Discharge Bathroom Accessibility: No Does the patient have any problems obtaining your medications?: No  Social/Family/Support Systems Anticipated Caregiver: Angie, wife Anticipated Ambulance person Information: [phone] Caregiver Availability: 24/7 Discharge Plan Discussed with Primary Caregiver: Yes Is Caregiver In Agreement with Plan?: Yes Does Caregiver/Family have Issues with Lodging/Transportation while Pt is in Rehab?: No  Goals Patient/Family Goal for Rehab: Supervision: PT/OT, Mod I:ST Expected length of stay: 12-14 days Pt/Family Agrees to Admission and willing to participate: Yes Program Orientation Provided & Reviewed  with Pt/Caregiver Including Roles  & Responsibilities: Yes  Decrease burden of Care through IP rehab admission: NA  Possible need for SNF placement upon discharge: Not anticipated  Patient Condition: I have reviewed medical records from [facility], spoken with CSW, and patient, spouse, and family member. I met with patient at the bedside and discussed via phone for inpatient rehabilitation assessment.  Patient will benefit from ongoing PT, OT, and SLP, can actively participate in 3 hours of therapy a day 5 days of the week, and can make measurable gains during the admission.  Patient will also benefit from the coordinated team approach during an Inpatient Acute Rehabilitation admission.  The patient will receive intensive therapy as well as Rehabilitation physician, nursing, social worker, and care management interventions.  Due to bladder management, safety, skin/wound care, disease management, medication administration, pain management, and patient education the patient requires 24 hour a day rehabilitation nursing.  The patient is currently Min G-Mod A with mobility and Min-Max A with basic ADLs.  Discharge setting and therapy post discharge at home with home health is anticipated.  Patient has agreed to participate in the Acute Inpatient Rehabilitation Program and will admit today.  Preadmission Screen Completed By:  Bethel Born, 11/12/2021 10:41 AM ______________________________________________________________________   Discussed status with Dr. Ranell Patrick on 11/12/21 at 0930 and received approval for admission today.  Admission Coordinator:  Bethel Born, CCC-SLP, time 10:42 am/Date 11/12/21   Assessment/Plan: Diagnosis: Debility Does the need for close, 24 hr/day Medical supervision in concert with the patient's rehab needs make it unreasonable for this patient to be served in a less intensive setting? Yes Co-Morbidities requiring supervision/potential complications:  sepsis, UTI, obesity, GI bleed, acute cholecystitis Due to bladder management, bowel management, safety, skin/wound care, disease management, medication administration, pain management, and patient education, does the patient require 24 hr/day rehab nursing? Yes Does the patient require coordinated care of a physician, rehab nurse, PT, OT, and SLP to address physical and functional deficits in the context of the above medical diagnosis(es)? Yes Addressing deficits in the following areas: balance, endurance, locomotion, strength, transferring, bowel/bladder control, bathing, dressing, feeding, grooming, toileting, cognition, and psychosocial support Can the patient actively participate in an intensive therapy program of at least 3 hrs of therapy 5 days a week? Yes The potential for patient to make measurable gains while on inpatient rehab is excellent Anticipated functional outcomes upon discharge from inpatient rehab: supervision PT, supervision OT, supervision SLP Estimated rehab length of stay to reach the above functional goals is: 10-14 days Anticipated discharge destination: Home 10. Overall Rehab/Functional Prognosis: excellent   MD Signature: Leeroy Cha, MD

## 2021-11-09 NOTE — Progress Notes (Addendum)
Progress Note   Patient: Troy Randall YTK:[id number] DOB: 12/11/1960 DOA: 11/06/2021     2 DOS: the patient was seen and examined on 11/09/2021   Brief hospital course: Troy Randall was admitted to the hospital with the working diagnosis of SVT, in the setting of sepsis and gram negative bacteremia.   61 yo male with the past medical history of T2DM, hypertension, and dyslipidemia, who was transferred from inpatient rehab due to the development of SVT.  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 Hospitalization complicated with multiple sclerosis treated with high dose systemic corticosteroids and plasma exchange, he required mechanical ventilation, and ultimately tracheostomy and PEG tube placement. During his hospitalization he was diagnosed with cholecystitis and cholecystectomy tube was placed.  At the inpatient rehab foley catheter was removed on 11/06/21 at 11 am, with no further urine output. He developed tachycardia 150 to 160 bpm prompting his transfer to the hospital.  At the time of his transfer his blood pressure was 118/59, HR 130 to 128, RR 31 and 02 saturation 97%, lungs with no wheezing or rhonchi, heart with S1 and S2 present and tachycardic, abdomen with no distention, cholecystectomy tube in place, no lower extremity edema.   Na 140, K 3,9 Cl 102 bicarbonate 23, glucose 250 bun 14 cr 1,15  Mag 1.5  ALK p 408, AST 1,227, ALT  535 T Bil 1,7  Lactic acid 3,2 and 2,4  Wbc 14,0 hgb 9,8 plt 215   Urine SG 1,023, 100 protein, > 50 rbc, 0-5 rbc   Blood cultures positive for E coli 2 out 2 bottles   Chest radiograph with no cardiomegaly, hypoinflation but no infiltrates.   EKG 165 bpm, normal axis, normal intervals, SVT rhythm, with no significant ST segment or T wave changes.   Abdominal US with decompressed gallbladder, cholecystectomy tube in place, with no other abnormalities.   Patient was placed on AV  blockade and antibiotic therapy  11/03 persistent urinary retention and foley cathter was reinserted.  11/04 Patient clinically improving, follow up CT abdomen and pelvis with no signs of deep infection.  11/05 E coli in blood cultures and Klebsiella in urine cultures, pan sensitive, change antibiotic to cefazolin.   Assessment and Plan: * SVT (supraventricular tachycardia) Patient has converted to sinus rhythm Tolerating well metoprolol. Now off telemetry.  Out of bed to chair tid with meals, PT and OT.    Sepsis due to gram-negative UTI (HCC) Severe sepsis present on admission (lactic acidosis). Gram negative bacteremia.  Urinary retention (present on admission).  Blood culture positive for E coli, urine culture positive for Klebsiella. Sensitive to cephalosporins CT abdomen and pelvis with no signs of deep infection or collection.  Wbc 5,0   Plan to continue antibiotic therapy with cefazolin, possible transition to oral cephalosporin in the next 24 to 48 hrs. Urinary retention, patient will need to keep foley cathter until follow up as outpatient with Urology.    Essential hypertension Systolic blood pressure 557 to 140 mmHg.  Plan to continue metoprolol.  Continue close blood pressure monitoring.   History of acute cholecystitis Elevated liver enzymes and hyper bilirubinemia  Imaging with abdominal US with contracted gallbladder. Cholangiography per tube with appropriate positioned and functioning cholecystectomy. No fluoroscopic evidence of cystic duct patency.  Follow up on CT abdomen and pelvis.   Liver profile continue to improve.   Continue supportive medical therapy.   History of lower GI bleeding:  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  No clinical signs of recurrent GI bleeding.   DM2 (diabetes mellitus, type 2) (HCC) Hyperglycemia.  Continue glucose cover and monitoring with insulin sliding scale.  Continue with basal insulin 12 units bid.   Patient is tolerating po well, his fasting glucose this am is 131 mg/dl.   Hyperlipidemia Hold on statin therapy until improvement in liver profile.   Hypomagnesemia Hypokalemia Electrolytes have been corrected, renal function continue to be stable. Patient off IV fluids.   Class 2 obesity Calculated BMI 35,9   MS (multiple sclerosis) ([place]) Patient has been recovering well in inpatient rehab Continue with PT and OT Trach and peg have been removed.         Subjective: Patient with no chest pain or dyspnea, no nausea or vomiting.   Physical Exam: Vitals:   11/08/21 1138 11/08/21 2024 11/09/21 0405 11/09/21 0728  BP: 116/71 (!) 150/81 117/73 121/64  Pulse: 75 65 71 76  Resp: _0 Temp: 98.6 F (37 C) 97.7 F (36.5 C) 97.6 F (36.4 C) (!) 97.5 F (36.4 C)  TempSrc: Oral Oral Oral Oral  SpO2: 96% 94% 96% 99%  Weight:   116.7 kg    Neurology awake and alert ENT with no pallor or icterus Cardiovascular with S1 and S2 present and rhythmic with no gallops, rubs or murmurs Respiratory with no wheezing, or rales Abdomen with no distention  No lower extremity edema  Data Reviewed:    Family Communication: I spoke with patient's wife at the bedside, we talked in detail about patient's condition, plan of care and prognosis and all questions were addressed.   Disposition: Status is: Inpatient Remains inpatient appropriate because: IV antibiotic therapy.   Planned Discharge Destination: Rehab     Author: Tawni Millers, MD 11/09/2021 10:18 AM  For on call review [email or web address].

## 2021-11-09 NOTE — Progress Notes (Signed)
Brief ID follow-up note:  Patient's blood cultures with E. coli is pansensitive.  Klebsiella urine culture also sensitive to many antibiotic options.  Will narrow from ceftriaxone to cefazolin based on this information.  New ID team will be here tomorrow.  Received daily ceftriaxone 2 g dose last night at 10:26 PM.  Will adjust cefazolin start time accordingly.   Raynelle Highland for Infectious Disease [facility] 11/09/2021, 9:12 AM

## 2021-11-10 DIAGNOSIS — I10 Essential (primary) hypertension: Secondary | ICD-10-CM | POA: Diagnosis not present

## 2021-11-10 DIAGNOSIS — I471 Supraventricular tachycardia, unspecified: Secondary | ICD-10-CM | POA: Diagnosis not present

## 2021-11-10 DIAGNOSIS — Z8719 Personal history of other diseases of the digestive system: Secondary | ICD-10-CM | POA: Diagnosis not present

## 2021-11-10 DIAGNOSIS — A4150 Gram-negative sepsis, unspecified: Secondary | ICD-10-CM | POA: Diagnosis not present

## 2021-11-10 LAB — COMPREHENSIVE METABOLIC PANEL
ALT: 177 U/L — ABNORMAL HIGH (ref 0–44)
AST: 47 U/L — ABNORMAL HIGH (ref 15–41)
Albumin: 2.9 g/dL — ABNORMAL LOW (ref 3.5–5.0)
Alkaline Phosphatase: 282 U/L — ABNORMAL HIGH (ref 38–126)
Anion gap: 9 (ref 5–15)
BUN: 8 mg/dL (ref 6–20)
CO2: 24 mmol/L (ref 22–32)
Calcium: 9 mg/dL (ref 8.9–10.3)
Chloride: 105 mmol/L (ref 98–111)
Creatinine, Ser: 0.68 mg/dL (ref 0.61–1.24)
GFR, Estimated: >60 mL/min (ref >60)
Glucose, Bld: 148 mg/dL — ABNORMAL HIGH (ref 70–99)
Potassium: 3.8 mmol/L (ref 3.5–5.1)
Sodium: 138 mmol/L (ref 135–145)
Total Bilirubin: 0.6 mg/dL (ref 0.3–1.2)
Total Protein: 6.1 g/dL — ABNORMAL LOW (ref 6.5–8.1)

## 2021-11-10 LAB — CBC
HCT: 34.8 % — ABNORMAL LOW (ref 39.0–52.0)
Hemoglobin: 10.7 g/dL — ABNORMAL LOW (ref 13.0–17.0)
MCH: 27.3 pg (ref 26.0–34.0)
MCHC: 30.7 g/dL (ref 30.0–36.0)
MCV: 88.8 fL (ref 80.0–100.0)
Platelets: 235 10*3/uL (ref 150–400)
RBC: 3.92 MIL/uL — ABNORMAL LOW (ref 4.22–5.81)
RDW: 15 % (ref 11.5–15.5)
WBC: 5.7 10*3/uL (ref 4.0–10.5)
nRBC: 0 % (ref 0.0–0.2)

## 2021-11-10 LAB — GLUCOSE, CAPILLARY
Glucose-Capillary: 114 mg/dL — ABNORMAL HIGH (ref 70–99)
Glucose-Capillary: 181 mg/dL — ABNORMAL HIGH (ref 70–99)
Glucose-Capillary: 195 mg/dL — ABNORMAL HIGH (ref 70–99)

## 2021-11-10 NOTE — Progress Notes (Signed)
Physical Therapy Treatment Patient Details Name: Troy Randall MRN: [id number] DOB: 12/14/1960 Today's Date: 11/10/2021   History of Present Illness The pt is a 61 yo male presenting 11/2 from [facility] with SVT and HR sustaining in 160s. Also found to have UTI. Pt with prolonged hospitalization starting 09/04/21 due to GI bleed complicated by concern for Guillain-Barr that ultimately required intubation, trach placement, and plasma exchange. Pt d/c to [facility] on 10/26. PMH includes: anemia, CVA, DM II, HLD, HTN, and kidney stones.    PT Comments    The pt was agreeable to session and able to demo good progress in endurance and minor improvements in dynamic stability this session. He continues to need increased cues, distraction-free environment, and increased time to complete instructions given. With continued activity, the pt presents with greater deficits and need for both physical assist and cues. He had x3 instances of R knee buckling with gait needing modA of 2 and remains dependent on BUE support at this time. He also demos poor visual scanning and needed physical assistance and cues to avoid obstacles and correct after running into objects on L side. Continue to recommend return to acute inpatient rehab when medically stable to further progress safety and independence with OOB transfers and mobility.     Recommendations for follow up therapy are one component of a multi-disciplinary discharge planning process, led by the attending physician.  Recommendations may be updated based on patient status, additional functional criteria and insurance authorization.  Follow Up Recommendations  Acute inpatient rehab (3hours/day)     Assistance Recommended at Discharge Frequent or constant Supervision/Assistance  Patient can return home with the following Two people to help with walking and/or transfers;Assistance with cooking/housework;Assist for transportation;Help with stairs or ramp for entrance;A lot of  help with bathing/dressing/bathroom   Equipment Recommendations  Rolling walker (2 wheels)    Recommendations for Other Services Rehab consult     Precautions / Restrictions Precautions Precautions: Fall Precaution Comments: R drain Restrictions Weight Bearing Restrictions: No     Mobility  Bed Mobility Overal bed mobility: Needs Assistance Bed Mobility: Supine to Sit     Supine to sit: Mod assist, HOB elevated     General bed mobility comments: modA to initiate movement of LE, then pt able to elevate trunk from elevated HOB. increased time an d cues    Transfers Overall transfer level: Needs assistance Equipment used: Rolling walker (2 wheels) Transfers: Sit to/from Stand Sit to Stand: Min assist, Min guard   Step pivot transfers: Mod assist       General transfer comment: minA of 1-2 to rise from EOB, minA-minG to rise to standing from armchair. pt needing increased cues, assist, and assist to manage RW with pivot to return to sitting EOB    Ambulation/Gait Ambulation/Gait assistance: Mod assist, +2 safety/equipment, Min assist Gait Distance (Feet): 10 Feet (+ 35 ft + 15 ft + 50 ft) Assistive device: Rolling walker (2 wheels) Gait Pattern/deviations: Decreased stride length, Ataxic, Staggering left, Drifts right/left, Narrow base of support Gait velocity: decreased Gait velocity interpretation: <1.31 ft/sec, indicative of household ambulator   General Gait Details: R knee buckling/hyperextending at times, min-modA to maintain upright. drifts to L and needed assist to avoid obstacles. poor visual scanning       Balance Overall balance assessment: Needs assistance Sitting-balance support: Feet supported Sitting balance-Leahy Scale: Fair Sitting balance - Comments: prefers UE support   Standing balance support: Bilateral upper extremity supported, During functional activity Standing balance-Leahy  Scale: Poor Standing balance comment: Requires UE support in  standing. R knee hyperextending and leaning to R with attempts at unsupported standing                            Cognition Arousal/Alertness: Awake/alert Behavior During Therapy: Flat affect Overall Cognitive Status: Impaired/Different from baseline Area of Impairment: Attention, Memory, Following commands, Safety/judgement, Awareness, Problem solving                   Current Attention Level: Focused Memory: Decreased recall of precautions, Decreased short-term memory Following Commands: Follows one step commands inconsistently, Follows one step commands with increased time Safety/Judgement: Decreased awareness of safety, Decreased awareness of deficits Awareness: Intellectual Problem Solving: Slow processing, Decreased initiation, Difficulty sequencing, Requires verbal cues, Requires tactile cues General Comments: pt attempting to follow commands, needing increased time and cues to complete. states "yeah" or "I am" but then does not follow through with instruction at times. Improved sequencing of familiar tasks, poor attention to R side that progressed with fatigue.           General Comments General comments (skin integrity, edema, etc.): VSS on RA, pt reports sweating and looks pale after walk, BP stable      Pertinent Vitals/Pain Pain Assessment Pain Assessment: No/denies pain Pain Intervention(s): Monitored during session     PT Goals (current goals can now be found in the care plan section) Acute Rehab PT Goals Patient Stated Goal: to return to [facility] PT Goal Formulation: With patient/family Time For Goal Achievement: 11/21/21 Potential to Achieve Goals: Good Progress towards PT goals: Progressing toward goals    Frequency    Min 3X/week      PT Plan Current plan remains appropriate    Co-evaluation PT/OT/SLP Co-Evaluation/Treatment: Yes Reason for Co-Treatment: Complexity of the patient's impairments (multi-system involvement);Necessary to  address cognition/behavior during functional activity;For patient/therapist safety;To address functional/ADL transfers PT goals addressed during session: Mobility/safety with mobility;Balance;Proper use of DME        AM-PAC PT "6 Clicks" Mobility   Outcome Measure  Help needed turning from your back to your side while in a flat bed without using bedrails?: A Lot Help needed moving from lying on your back to sitting on the side of a flat bed without using bedrails?: A Lot Help needed moving to and from a bed to a chair (including a wheelchair)?: Total Help needed standing up from a chair using your arms (e.g., wheelchair or bedside chair)?: Total Help needed to walk in hospital room?: Total Help needed climbing 3-5 steps with a railing? : Total 6 Click Score: 8    End of Session Equipment Utilized During Treatment: Gait belt Activity Tolerance: Patient tolerated treatment well Patient left: in bed;with call bell/phone within reach;with bed alarm set;with family/visitor present Nurse Communication: Mobility status PT Visit Diagnosis: Unsteadiness on feet (R26.81);Muscle weakness (generalized) (M62.81);Difficulty in walking, not elsewhere classified (R26.2)     Time: 1610-9604 PT Time Calculation (min) (ACUTE ONLY): 23 min  Charges:  $Neuromuscular Re-education: 8-22 mins                     Vickki Muff, PT, DPT   Acute Rehabilitation Department   Ronnie Derby 11/10/2021, 1:08 PM

## 2021-11-10 NOTE — Progress Notes (Signed)
Progress Note   Patient: Troy Randall UUV:[id number] DOB: May 23, 1960 DOA: 11/06/2021     3 DOS: the patient was seen and examined on 11/10/2021   Brief hospital course: Troy Randall was admitted to the hospital with the working diagnosis of SVT, in the setting of sepsis and gram negative bacteremia.   61 yo male with the past medical history of T2DM, hypertension, and dyslipidemia, who was transferred from inpatient rehab due to the development of SVT.  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 Hospitalization complicated with multiple sclerosis treated with high dose systemic corticosteroids and plasma exchange, he required mechanical ventilation, and ultimately tracheostomy and PEG tube placement. During his hospitalization he was diagnosed with cholecystitis and cholecystectomy tube was placed.  At the inpatient rehab foley catheter was removed on 11/06/21 at 11 am, with no further urine output. He developed tachycardia 150 to 160 bpm prompting his transfer to the hospital.  At the time of his transfer his blood pressure was 118/59, HR 130 to 128, RR 31 and 02 saturation 97%, lungs with no wheezing or rhonchi, heart with S1 and S2 present and tachycardic, abdomen with no distention, cholecystectomy tube in place, no lower extremity edema.   Na 140, K 3,9 Cl 102 bicarbonate 23, glucose 250 bun 14 cr 1,15  Mag 1.5  ALK p 408, AST 1,227, ALT  535 T Bil 1,7  Lactic acid 3,2 and 2,4  Wbc 14,0 hgb 9,8 plt 215   Urine SG 1,023, 100 protein, > 50 rbc, 0-5 rbc   Blood cultures positive for E coli 2 out 2 bottles   Chest radiograph with no cardiomegaly, hypoinflation but no infiltrates.   EKG 165 bpm, normal axis, normal intervals, SVT rhythm, with no significant ST segment or T wave changes.   Abdominal US with decompressed gallbladder, cholecystectomy tube in place, with no other abnormalities.   Patient was placed on AV  blockade and antibiotic therapy  11/03 persistent urinary retention and foley cathter was reinserted.  11/04 Patient clinically improving, follow up CT abdomen and pelvis with no signs of deep infection.  11/05 E coli in blood cultures and Klebsiella in urine cultures, pan sensitive, change antibiotic to cefazolin.  11/06 patient clinically improved and medically stable to be transfer to [facility].   Assessment and Plan: * SVT (supraventricular tachycardia) Patient has converted to sinus rhythm Tolerating well metoprolol. Now off telemetry.  Out of bed to chair tid with meals, PT and OT.    Sepsis due to gram-negative UTI (HCC) Severe sepsis present on admission (lactic acidosis). Gram negative bacteremia.  Urinary retention (present on admission).  Blood culture positive for E coli, urine culture positive for Klebsiella. Sensitive to cephalosporins CT abdomen and pelvis with no signs of deep infection or collection.  Wbc 5,0   Plan to continue antibiotic therapy with cefazolin, possible transition to oral cephalosporin in the next 24 hrs.  Urinary retention, patient will need to keep foley cathter until follow up as outpatient with Urology.    Essential hypertension Systolic blood pressure 474 to 140 mmHg.  Plan to continue with metoprolol.  Continue close blood pressure monitoring.   History of acute cholecystitis Elevated liver enzymes and hyper bilirubinemia  Imaging with abdominal US with contracted gallbladder. Cholangiography per tube with appropriate positioned and functioning cholecystectomy. No fluoroscopic evidence of cystic duct patency.  Follow up on CT abdomen and pelvis.   Liver profile continue to improve.  Continue supportive medical therapy.   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   DM2 (diabetes mellitus, type 2) (HCC) Hyperglycemia.  Continue glucose cover and  monitoring with insulin sliding scale.  Continue with basal insulin 12 units bid.  Patient is tolerating po well.   Hyperlipidemia Hold on statin therapy until improvement in liver profile.   Hypomagnesemia Hypokalemia Electrolytes have been corrected, renal function continue to be stable. Patient off IV fluids.   Class 2 obesity Calculated BMI 35,9   MS (multiple sclerosis) (Troy Randall) Patient has been recovering well in inpatient rehab Continue with PT and OT Trach and peg have been removed.         Subjective: Patient with no chest pain or dyspnea, he has been tolerating po well, positive bowel movements, no abdominal pain.   Physical Exam: Vitals:   11/09/21 1606 11/09/21 1933 11/10/21 0352 11/10/21 0754  BP: 125/75 (!) 108/55 131/85 135/84  Pulse: 63 72 66 67  Resp: _0 Temp: 98.1 F (36.7 C) 97.6 F (36.4 C) (!) 97.4 F (36.3 C) (!) 97.4 F (36.3 C)  TempSrc: Oral Oral Oral Oral  SpO2: 98%  97% 97%  Weight:   117.2 kg    Neurology awake and alert ENT with mild pallor Cardiovascular with S1 and S2 present and rhythmic Respiratory with no rales or wheezing Abdomen with no distention, biliary drain in place No lower extremity edema  Data Reviewed:    Family Communication: no family at the bedside   Disposition: Status is: Inpatient Remains inpatient appropriate because: pending transfer back to [facility]   Planned Discharge Destination: Skilled nursing facility    Author: Tawni Millers, MD 11/10/2021 12:02 PM  For on call review [email or web address].

## 2021-11-10 NOTE — Progress Notes (Signed)
Inpatient Rehab Admissions Coordinator:  ?Insurance authorization started. Will continue to follow. ? ? ?Neftaly Inzunza Graves Madden, MS, CCC-SLP ?Admissions Coordinator ?[phone] ? ?

## 2021-11-10 NOTE — Plan of Care (Signed)

## 2021-11-10 NOTE — Progress Notes (Signed)
Occupational Therapy Treatment Patient Details Name: Troy Randall MRN: [id number] DOB: 1960/04/05 Today's Date: 11/10/2021   History of present illness The pt is a 61 yo male presenting 11/2 from [facility] with SVT and HR sustaining in 160s. Also found to have UTI. Pt with prolonged hospitalization starting 09/04/21 due to GI bleed complicated by concern for Guillain-Barr that ultimately required intubation, trach placement, and plasma exchange. Pt d/c to [facility] on 10/26. PMH includes: anemia, CVA, DM II, HLD, HTN, and kidney stones.   OT comments  Pt making good progress in area of standing balance during adls and following simple one step commands related to familiar tasks. Spoke with wife about allowing pt to feed himself. Pt feeds self with min assist.  Pt most limited by cognitive motor planning deficits and perceptual deficits.  Difficult to know if pt does not see to the L or is actually neglecting the L vs must not paying attention. Pt did run into or could not find 6 items during session and all incidences were on pt's L side.  Will continue to see and focus on adls and functional mobility with all adls as well as cognition and safety.   Recommendations for follow up therapy are one component of a multi-disciplinary discharge planning process, led by the attending physician.  Recommendations may be updated based on patient status, additional functional criteria and insurance authorization.    Follow Up Recommendations  Acute inpatient rehab (3hours/day)    Assistance Recommended at Discharge Frequent or constant Supervision/Assistance  Patient can return home with the following  A lot of help with walking and/or transfers;A lot of help with bathing/dressing/bathroom;Assistance with cooking/housework;Direct supervision/assist for medications management;Help with stairs or ramp for entrance;Assist for transportation;Direct supervision/assist for financial management   Equipment Recommendations   None recommended by OT    Recommendations for Other Services      Precautions / Restrictions Precautions Precautions: Fall Precaution Comments: R drain Restrictions Weight Bearing Restrictions: No       Mobility Bed Mobility               General bed mobility comments: modA to initiate movement of LE, then pt able to elevate trunk from elevated HOB. increased time an d cues.  Pt had difficult time moving up laterally in bed. Unable to follow cues to do so.  Required max assist to scoot up in bed on first trial but once pt knew what we were doing pt moved up with min assist.    Transfers                         Balance         Postural control: Right lateral lean                                 ADL either performed or assessed with clinical judgement   ADL Overall ADL's : Needs assistance/impaired Eating/Feeding: Minimal assistance;Sitting Eating/Feeding Details (indicate cue type and reason): Pt used spoon and fork to feed self potatoes and sandwhich during this session sitting on EOB. Walked in to wife feeding pt and spoke to her about letting pt feed self as he is able and this is a great tasks to feel independent with and to work on cognition with. Grooming: Oral care;Wash/dry hands;Minimal assistance;Standing Grooming Details (indicate cue type and reason): Pt stood at sink with min assist to  occasional mod assist with posterior lean and R lean a times. Pt required step by step instructions as pt was distrtacted by his walker and other thins in room outside of task at hand. Pt with mild perseveration at end of tasks with cleaning toothbrush and needed one VC to put the brush down.                 Toilet Transfer: Minimal assistance;Rolling walker (2 wheels);Ambulation;Regular Teacher, adult education Details (indicate cue type and reason): simulated via functional mobility         Functional mobility during ADLs: Moderate  assistance;Rolling walker (2 wheels) General ADL Comments: Pt required mod assist to mobilize today due to running into items on L side in hallway. See PT note.    Extremity/Trunk Assessment Upper Extremity Assessment Upper Extremity Assessment: Overall WFL for tasks assessed LUE Deficits / Details: Pt uses R UE more for adls and is R handed. Pt sat on L hand and did not seem to know it was underneath him.   Lower Extremity Assessment Lower Extremity Assessment: Defer to PT evaluation        Vision   Vision Assessment?: Yes Eye Alignment: Within Functional Limits Ocular Range of Motion: Within Functional Limits Alignment/Gaze Preference: Within Defined Limits Tracking/Visual Pursuits: Able to track stimulus in all quads without difficulty Visual Fields: No apparent deficits Additional Comments: Pt appears to  neglect L side during adl tasks. Pt could not find clock on the Left side of room, ran into L doorjam on way out of room and ran into 3 items in hallway on on the L on way back to room.   Perception Perception Perception: Impaired   Praxis Praxis Praxis: Impaired Praxis Impairment Details: Motor planning    Cognition                                       General Comments: pt attempting to follow commands, needing increased time and cues to complete. states "yeah" or "I am" but then does not follow through with instruction at times. Improved sequencing of familiar tasks, poor attention to L side that progressed with fatigue.        Exercises      Shoulder Instructions       General Comments VSS on RA, pt reports sweating and looks pale after walk, BP stable    Pertinent Vitals/ Pain          Home Living                                          Prior Functioning/Environment              Frequency  Min 2X/week        Progress Toward Goals  OT Goals(current goals can now be found in the care plan section)   Progress towards OT goals: Progressing toward goals  Acute Rehab OT Goals Patient Stated Goal: to go to rehab (wife) OT Goal Formulation: With family Time For Goal Achievement: 11/22/21 Potential to Achieve Goals: Fair ADL Goals Pt Will Perform Upper Body Dressing: with min guard assist;standing;sitting Pt Will Perform Lower Body Dressing: with min assist;sitting/lateral leans;sit to/from stand Pt Will Transfer to Toilet: ambulating;regular height toilet;with modified independence Pt/caregiver will Perform Home Exercise Program: Increased strength;Both  right and left upper extremity;With written HEP provided;With Supervision Additional ADL Goal #1: Pt will follow 2 step command in prep for ADLs Additional ADL Goal #2: Pt will perform bed mobility with Min A + 2 in preparation for ADLs Additional ADL Goal #3: Pt will roll with Min A for pressure relief and pericare.  Plan Discharge plan remains appropriate    Co-evaluation    PT/OT/SLP Co-Evaluation/Treatment: Yes Reason for Co-Treatment: Complexity of the patient's impairments (multi-system involvement);Necessary to address cognition/behavior during functional activity;For patient/therapist safety;To address functional/ADL transfers PT goals addressed during session: Mobility/safety with mobility;Balance;Proper use of DME OT goals addressed during session: ADL's and self-care      AM-PAC OT "6 Clicks" Daily Activity     Outcome Measure   Help from another person eating meals?: A Little Help from another person taking care of personal grooming?: A Little Help from another person toileting, which includes using toliet, bedpan, or urinal?: A Lot Help from another person bathing (including washing, rinsing, drying)?: A Lot Help from another person to put on and taking off regular upper body clothing?: A Lot Help from another person to put on and taking off regular lower body clothing?: A Lot 6 Click Score: 14    End of Session  Equipment Utilized During Treatment: Gait belt;Rolling walker (2 wheels)  OT Visit Diagnosis: Muscle weakness (generalized) (M62.81);Other symptoms and signs involving cognitive function;Pain;Unsteadiness on feet (R26.81)   Activity Tolerance Patient tolerated treatment well   Patient Left in bed;with call bell/phone within reach;with family/visitor present;with bed alarm set   Nurse Communication Mobility status        Time: 0258-5277 OT Time Calculation (min): 39 min  Charges: OT General Charges $OT Visit: 1 Visit OT Treatments $Self Care/Home Management : 23-37 mins   Glenford Peers 11/10/2021, 1:37 PM

## 2021-11-10 NOTE — Progress Notes (Incomplete)
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    History of acute cholecystitis   MS (multiple sclerosis) ([place])   Hypomagnesemia   Class 2 obesity   Sepsis due to gram-negative UTI (HCC)   Antibiotics:  Ceftriaxone 11/2-11/4, cefazolin 11/5-c Metronidazole 11/2  Lines/Hardwares: RUQ drain with bile in bag   Interval Events: remains afebrile,   Assessment 60 year old male with past medical history as below including GI bleed requiring SMA embolization, Demyelinating disease requiring ventilation, status post Plex and cholecystitis status post PERC Coley drain by IR on 9/3 and exchange on 10/20 with  ESBL E coli bacteremia - Most likely source is GB over urinary given he has prior GB cultures 9/3 is E coli and complaint of RT UQ abdominal pain with no reported GU symptoms   Transaminitis - down trending   Encephalopathy - seems somewhat confused and answers some questions correctly, not all. Follows commands appropriately   Recommendations Continue cefazolin as is  Would recommend to check with Surgery if he is a surgical candidate for cholecystectomy  Monitor CBC and CMP D/w Dr Cathlean Sauer  Following   Rest of the management as per the primary team. Thank you for the consult. Please page with pertinent questions or concerns.  ______________________________________________________________________ Subjective patient seen and examined at the bedside. Wife at bedside. No complains whatsoever.   Past Medical History:   Diagnosis Date   Anemia    Arthritis    CVA (cerebral vascular accident) ([place])    Diabetes mellitus without complication ([place])    Hyperlipidemia    Hypertension    Kidney stones    Past Surgical History:  Procedure Laterality Date   ANKLE SURGERY     BIOPSY  09/10/2021   Procedure: BIOPSY;  Surgeon: Carol Ada, MD;  Location: [facility] ENDOSCOPY;  Service: Gastroenterology;;   BIOPSY  10/07/2021   Procedure: BIOPSY;  Surgeon: Carol Ada, MD;  Location: [facility] ENDOSCOPY;  Service: Gastroenterology;;   COLONOSCOPY N/A 10/07/2021   Procedure: COLONOSCOPY;  Surgeon: Carol Ada, MD;  Location: [place];  Service: Gastroenterology;  Laterality: N/A;   COLONOSCOPY WITH PROPOFOL N/A 09/06/2021   Procedure: COLONOSCOPY WITH PROPOFOL;  Surgeon: Harvel Quale, MD;  [facility];  Service: Gastroenterology;  Laterality: N/A;   ENTEROSCOPY N/A 09/10/2021   Procedure: ENTEROSCOPY;  Surgeon: Carol Ada, MD;  Location: [place];  Service: Gastroenterology;  Laterality: N/A;   ENTEROSCOPY  09/06/2021   Procedure: ENTEROSCOPY;  Surgeon: Harvel Quale, MD;  [facility];  Service: Gastroenterology;;   ESOPHAGOGASTRODUODENOSCOPY (EGD) WITH PROPOFOL  09/06/2021   Procedure: ESOPHAGOGASTRODUODENOSCOPY (EGD) WITH PROPOFOL;  Surgeon: Harvel Quale, MD;  [facility];  Service: Gastroenterology;;   [place] N/A 09/19/2021   Procedure: Beryle Quant;  Surgeon: Carol Ada, MD;  Location: [place];  Service: Gastroenterology;  Laterality: N/A;   FOREIGN BODY REMOVAL  09/19/2021   Procedure: FOREIGN BODY REMOVAL;  Surgeon: Carol Ada, MD;  Location: [facility] ENDOSCOPY;  Service: Gastroenterology;;   Freda Munro CAPSULE STUDY  09/06/2021   Procedure: GIVENS CAPSULE STUDY;  Surgeon: Harvel Quale,  MD;  [facility];  Service: Gastroenterology;;   HERNIA REPAIR     umbilical x1 Incisional x1   INCISIONAL  HERNIA REPAIR  04/13/2011   Procedure: LAPAROSCOPIC INCISIONAL HERNIA;  Surgeon: Jamesetta So, MD;  Location: [facility];  Service: General;  Laterality: N/A;  Recurrent Laparoscopic Incisional Herniorraphy with Mesh   IR ANGIOGRAM SELECTIVE EACH ADDITIONAL VESSEL  09/09/2021   IR ANGIOGRAM VISCERAL SELECTIVE  09/07/2021   IR ANGIOGRAM VISCERAL SELECTIVE  09/06/2021   IR ANGIOGRAM VISCERAL SELECTIVE  09/06/2021   IR CHOLANGIOGRAM EXISTING TUBE  11/06/2021   IR EMBO ART  VEN HEMORR LYMPH EXTRAV  INC GUIDE ROADMAPPING  09/06/2021   IR EXCHANGE BILIARY DRAIN  10/24/2021   IR GUIDED DRAIN W CATHETER PLACEMENT  09/07/2021   IR US GUIDE BX ASP/DRAIN  09/07/2021   IR US GUIDE VASC ACCESS RIGHT  09/07/2021   IR US GUIDE VASC ACCESS RIGHT  09/06/2021   KIDNEY STONE SURGERY      Vitals BP 135/84 (BP Location: Right Wrist)   Pulse 67   Temp (!) 97.4 F (36.3 C) (Oral)   Resp 18   Wt 117.2 kg   SpO2 97%   BMI 35.04 kg/m     Physical Exam Constitutional:  lying in the bed and appears comfortable     Comments:   Cardiovascular:     Rate and Rhythm: Normal rate and regular rhythm.     Heart sounds:   Pulmonary:     Effort: Pulmonary effort is normal on room air     Comments:   Abdominal:     Palpations: Abdomen is soft.     Tenderness: non distended and non tender, RUQ drain with no signs of surrounding cellulitis , drainage bag with greenish bile   Musculoskeletal:        General: No swelling or tenderness in peripheral joints   Skin:    Comments:   Neurological:     General: grossly non focal, awake, alert. He is able to tell he is at [facility], recognizes his wife, but said 76 to current year and could not tell the president's name. He follows commands appropriately   Psychiatric:        Mood and Affect: Mood normal.   Pertinent Microbiology Results for orders placed or performed during the hospital encounter of 10/30/21  C Difficile Quick Screen w PCR reflex     Status: None   Collection Time:  10/31/21  6:11 PM   Specimen: STOOL  Result Value Ref Range Status   C Diff antigen NEGATIVE NEGATIVE Final   C Diff toxin NEGATIVE NEGATIVE Final   C Diff interpretation No C. difficile detected.  Final    Comment: Performed at [facility], [place] [address], [place], [place] [address]  MRSA Next Gen by PCR, Nasal     Status: Abnormal   Collection Time: 11/02/21  8:14 AM   Specimen: Nasal Mucosa; Nasal Swab  Result Value Ref Range Status   MRSA by PCR Next Gen DETECTED (A) NOT DETECTED Final    Comment: RESULT CALLED TO, READ BACK BY AND VERIFIED WITH: 11/02/2021 AT 1123 RN TOMEKA PUGH, ADC (NOTE) The GeneXpert MRSA Assay (FDA approved for NASAL specimens only), is one component of a comprehensive MRSA colonization surveillance program. It is not intended to diagnose MRSA infection nor to guide or monitor treatment for MRSA infections. Test performance is not FDA approved in patients less than 14 years old. Performed  at [facility], [address], [place], [place] [address]   MRSA Next Gen by PCR, Nasal     Status: Abnormal   Collection Time: 11/03/21 10:52 AM   Specimen: Nasal Mucosa; Nasal Swab  Result Value Ref Range Status   MRSA by PCR Next Gen DETECTED (A) NOT DETECTED Final    Comment: RESULT CALLED TO, READ BACK BY AND VERIFIED WITH: RN E.HUNT AT 1437 ON 11/03/2021 BY T.SAAD. (NOTE) The GeneXpert MRSA Assay (FDA approved for NASAL specimens only), is one component of a comprehensive MRSA colonization surveillance program. It is not intended to diagnose MRSA infection nor to guide or monitor treatment for MRSA infections. Test performance is not FDA approved in patients less than 13 years old. Performed at [facility], [address] [place], [place] [address]   Urine Culture     Status: Abnormal   Collection Time: 11/06/21  8:19 PM   Specimen: In/Out Cath Urine  Result Value Ref Range Status   Specimen Description IN/OUT CATH URINE  Final    Special Requests   Final    NONE Performed at [facility], [address] [place], [place] [address]    Culture >=100,000 COLONIES/mL KLEBSIELLA PNEUMONIAE (A)  Final   Report Status 11/08/2021 FINAL  Final   Organism ID, Bacteria KLEBSIELLA PNEUMONIAE (A)  Final      Susceptibility   Klebsiella pneumoniae - MIC*    AMPICILLIN >=32 RESISTANT Resistant     CEFAZOLIN <=4 SENSITIVE Sensitive     CEFEPIME <=0.12 SENSITIVE Sensitive     CEFTRIAXONE <=0.25 SENSITIVE Sensitive     CIPROFLOXACIN <=0.25 SENSITIVE Sensitive     GENTAMICIN <=1 SENSITIVE Sensitive     IMIPENEM <=0.25 SENSITIVE Sensitive     NITROFURANTOIN 64 INTERMEDIATE Intermediate     TRIMETH/SULFA <=20 SENSITIVE Sensitive     AMPICILLIN/SULBACTAM 8 SENSITIVE Sensitive     PIP/TAZO <=4 SENSITIVE Sensitive     * >=100,000 COLONIES/mL KLEBSIELLA PNEUMONIAE  Culture, blood (Routine X 2) w Reflex to ID Panel     Status: Abnormal   Collection Time: 11/06/21  8:36 PM   Specimen: BLOOD  Result Value Ref Range Status   Specimen Description BLOOD LEFT ANTECUBITAL  Final   Special Requests   Final    BOTTLES DRAWN AEROBIC AND ANAEROBIC Blood Culture adequate volume   Culture  Setup Time   Final    GRAM NEGATIVE RODS IN BOTH AEROBIC AND ANAEROBIC BOTTLES CRITICAL VALUE NOTED.  VALUE IS CONSISTENT WITH PREVIOUSLY REPORTED AND CALLED VALUE.    Culture (A)  Final    ESCHERICHIA COLI SUSCEPTIBILITIES PERFORMED ON PREVIOUS CULTURE WITHIN THE LAST 5 DAYS. Performed at [facility], [address], [place], [place] [address]    Report Status 11/09/2021 FINAL  Final  Culture, blood (Routine X 2) w Reflex to ID Panel     Status: Abnormal   Collection Time: 11/06/21  8:36 PM   Specimen: BLOOD LEFT ARM  Result Value Ref Range Status   Specimen Description BLOOD LEFT ARM  Final   Special Requests   Final    BOTTLES DRAWN AEROBIC AND ANAEROBIC Blood Culture adequate volume   Culture  Setup Time   Final    GRAM NEGATIVE  RODS IN BOTH AEROBIC AND ANAEROBIC BOTTLES CRITICAL RESULT CALLED TO, READ BACK BY AND VERIFIED WITH: Horton Chin [id number] AT 0954 BY EC Performed at [facility], [address],  [place], [place] [address]    Culture ESCHERICHIA COLI (A)  Final   Report Status 11/09/2021 FINAL  Final   Organism ID, Bacteria ESCHERICHIA COLI  Final      Susceptibility   Escherichia coli - MIC*    AMPICILLIN 4 SENSITIVE Sensitive     CEFAZOLIN <=4 SENSITIVE Sensitive     CEFEPIME <=0.12 SENSITIVE Sensitive     CEFTAZIDIME <=1 SENSITIVE Sensitive     CEFTRIAXONE <=0.25 SENSITIVE Sensitive     CIPROFLOXACIN <=0.25 SENSITIVE Sensitive     GENTAMICIN <=1 SENSITIVE Sensitive     IMIPENEM <=0.25 SENSITIVE Sensitive     TRIMETH/SULFA <=20 SENSITIVE Sensitive     AMPICILLIN/SULBACTAM <=2 SENSITIVE Sensitive     PIP/TAZO <=4 SENSITIVE Sensitive     * ESCHERICHIA COLI  Blood Culture ID Panel (Reflexed)     Status: Abnormal   Collection Time: 11/06/21  8:36 PM  Result Value Ref Range Status   Enterococcus faecalis NOT DETECTED NOT DETECTED Final   Enterococcus Faecium NOT DETECTED NOT DETECTED Final   Listeria monocytogenes NOT DETECTED NOT DETECTED Final   Staphylococcus species NOT DETECTED NOT DETECTED Final   Staphylococcus aureus (BCID) NOT DETECTED NOT DETECTED Final   Staphylococcus epidermidis NOT DETECTED NOT DETECTED Final   Staphylococcus lugdunensis NOT DETECTED NOT DETECTED Final   Streptococcus species NOT DETECTED NOT DETECTED Final   Streptococcus agalactiae NOT DETECTED NOT DETECTED Final   Streptococcus pneumoniae NOT DETECTED NOT DETECTED Final   Streptococcus pyogenes NOT DETECTED NOT DETECTED Final   A.calcoaceticus-baumannii NOT DETECTED NOT DETECTED Final   Bacteroides fragilis NOT DETECTED NOT DETECTED Final   Enterobacterales DETECTED (A) NOT DETECTED Final    Comment: Enterobacterales represent a large order of gram negative bacteria, not a single organism. CRITICAL  RESULT CALLED TO, READ BACK BY AND VERIFIED WITH: PHARMD [name] FRENS [id number] AT 6440 BY EC    Enterobacter cloacae complex NOT DETECTED NOT DETECTED Final   Escherichia coli DETECTED (A) NOT DETECTED Final    Comment: CRITICAL RESULT CALLED TO, READ BACK BY AND VERIFIED WITH: PHARMD [name] FRENS [id number] AT 0954 BY EC    Klebsiella aerogenes NOT DETECTED NOT DETECTED Final   Klebsiella oxytoca NOT DETECTED NOT DETECTED Final   Klebsiella pneumoniae NOT DETECTED NOT DETECTED Final   Proteus species NOT DETECTED NOT DETECTED Final   Salmonella species NOT DETECTED NOT DETECTED Final   Serratia marcescens NOT DETECTED NOT DETECTED Final   Haemophilus influenzae NOT DETECTED NOT DETECTED Final   Neisseria meningitidis NOT DETECTED NOT DETECTED Final   Pseudomonas aeruginosa NOT DETECTED NOT DETECTED Final   Stenotrophomonas maltophilia NOT DETECTED NOT DETECTED Final   Candida albicans NOT DETECTED NOT DETECTED Final   Candida auris NOT DETECTED NOT DETECTED Final   Candida glabrata NOT DETECTED NOT DETECTED Final   Candida krusei NOT DETECTED NOT DETECTED Final   Candida parapsilosis NOT DETECTED NOT DETECTED Final   Candida tropicalis NOT DETECTED NOT DETECTED Final   Cryptococcus neoformans/gattii NOT DETECTED NOT DETECTED Final   CTX-M ESBL NOT DETECTED NOT DETECTED Final   Carbapenem resistance IMP NOT DETECTED NOT DETECTED Final   Carbapenem resistance KPC NOT DETECTED NOT DETECTED Final   Carbapenem resistance NDM NOT DETECTED NOT DETECTED Final   Carbapenem resist OXA 48 LIKE NOT DETECTED NOT DETECTED Final   Carbapenem resistance VIM NOT DETECTED NOT DETECTED Final    Comment: Performed at [facility], [address] [place], [place] [address]   Pertinent Lab.  Latest Ref Rng & Units 11/10/2021   12:27 AM 11/09/2021   12:34 AM 11/08/2021   10:10 AM  CBC  WBC 4.0 - 10.5 K/uL 5.7  5.0  6.2   Hemoglobin 13.0 - 17.0 g/dL 10.7  9.6  9.5   Hematocrit 39.0 - 52.0 %  34.8  31.5  31.6   Platelets 150 - 400 K/uL 235  213  211       Latest Ref Rng & Units 11/10/2021   12:27 AM 11/09/2021   12:34 AM 11/08/2021   10:10 AM  CMP  Glucose 70 - 99 mg/dL 148  131    BUN 6 - 20 mg/dL 8  7    Creatinine 0.61 - 1.24 mg/dL 0.68  0.62    Sodium 135 - 145 mmol/L 138  138    Potassium 3.5 - 5.1 mmol/L 3.8  4.0    Chloride 98 - 111 mmol/L 105  103    CO2 22 - 32 mmol/L 24  23    Calcium 8.9 - 10.3 mg/dL 9.0  8.8    Total Protein 6.5 - 8.1 g/dL 6.1  5.8  5.9   Total Bilirubin 0.3 - 1.2 mg/dL 0.6  0.8  1.0   Alkaline Phos 38 - 126 U/L 282  321  330   AST 15 - 41 U/L 47  130  259   ALT 0 - 44 U/L 177  266  337      Pertinent Imaging today Plain films and CT images have been personally visualized and interpreted; radiology reports have been reviewed. Decision making incorporated into the Impression / Recommendations.  I spent 60 minutes for this patient encounter including review of prior medical records, coordination of care with primary/other specialist with greater than 50% of time being face to face/counseling and discussing diagnostics/treatment plan with the patient/family.  Electronically signed by:   Rosiland Oz, MD Infectious Disease Physician [facility] for Infectious Disease Pager: [phone]

## 2021-11-11 DIAGNOSIS — D638 Anemia in other chronic diseases classified elsewhere: Secondary | ICD-10-CM

## 2021-11-11 LAB — GLUCOSE, CAPILLARY
Glucose-Capillary: 141 mg/dL — ABNORMAL HIGH (ref 70–99)
Glucose-Capillary: 174 mg/dL — ABNORMAL HIGH (ref 70–99)
Glucose-Capillary: 155 mg/dL — ABNORMAL HIGH (ref 70–99)
Glucose-Capillary: 128 mg/dL — ABNORMAL HIGH (ref 70–99)

## 2021-11-11 MED ORDER — AMOXICILLIN-POT CLAVULANATE 875-125 MG PO TABS
1.0000 | ORAL_TABLET | Freq: Two times a day (BID) | ORAL | Status: DC
  Administered 2021-11-11 – 2021-11-12 (×2): 1 via ORAL
  Filled 2021-11-11 (×2): qty 1

## 2021-11-11 NOTE — Consult Note (Signed)
   [facility] CM Inpatient Consult   11/11/2021  KATHY WARES 1960/02/07 [id number]  [employer] [[facility]] Patient: Troy Randall   Primary Care Provider:  [name], MD is a provider that is listed for the Trasnition of Care   Patient screened for hospitalization for stay unplanned readmission and to assess for potential [place] Management service needs for post hospital transition for care coordination.  Review of patient's electronic medical record reveals patient is for potential to return to inpatient rehab when medically ready, reviewed for return to acute setting and potential ongoing medical surgical needs.  11:00 Met with patient and wife at the bedside regarding post hospital follow up.  Patient is currently awaiting potential surgical intervention and in hopes to return to inpatient rehab with insurance approval. Explained the reason for the rounding visit and gave the wife a reminder card appointment card with [facility] information and 24 hour nurse advise line.  Explained that this writer will follow during inpatient stay.  She verbalized understanding and was gracious.   Plan:  Continue to follow progress and disposition to assess for post hospital community care coordination/management needs.  Referral request for community care coordination: ongoing/following.  Of note, [facility] Care Management/Population Health does not replace or interfere with any arrangements made by the Inpatient Transition of Care team.  For questions contact:   Natividad Brood, RN BSN [place]  [phone] business mobile phone Toll free office [phone]  *[place]  [phone] Fax number: [phone] [email or web address]

## 2021-11-11 NOTE — Progress Notes (Addendum)
ID Brief Note   Remains afebrile  No labs today  Plans for transfer to CIR noted    Recommend to fu with surgery to see if surgical candidate for cholecystectomy given likely source for current bacteremia. Have discussed with Dr Cathlean Sauer  Can continue IV cefazolin inpatient  Patient has h/o penicillin allergy per EMR ( hives in 2013).  D/w ID Pharm D, has received IV zosyn on 09/07/2021 and augmentin on 12/13/2020 without any concerns.   Plan for one dose of PO amoxicillin/clavulanate challenge. If tolerates OK, can switch to PO augmentin for the remainder of duration of tx.   Complete 10 days of tx for GNR bacteremia ( IV and PO). EOT 10/15/21 ID will sign off. Please call with questions   Rosiland Oz, MD Infectious Disease Physician [facility] for Infectious Disease [address] [place], [place] [address] Phone: [phone]  Fax: [phone]

## 2021-11-11 NOTE — Progress Notes (Signed)
ID Pharmacy Note   Noted patient has a history of a penicillin allergy of hives noted in 2013. Since that time, patient has had augmentin in December of 2022 and a dose of Zosyn during this hospitalization. He appears to have tolerated both of these medications.   ID pharmacy team spoke with Dr. [place] Bali and will go ahead and change to Augmentin to ensure tolerance and reconcile allergy.    Jimmy Footman, PharmD, BCPS, BCIDP Infectious Diseases Clinical Pharmacist Phone: [phone] 11/11/2021 1:58 PM

## 2021-11-11 NOTE — Progress Notes (Signed)
Progress Note   Patient: Troy Randall MRN:[id number] DOB: 07/28/1960 DOA: 11/06/2021     4 DOS: the patient was seen and examined on 11/11/2021   Brief hospital course: Troy Randall was admitted to the hospital with the working diagnosis of SVT, in the setting of sepsis and gram negative bacteremia.   61 yo male with the past medical history of T2DM, hypertension, and dyslipidemia, who was transferred from inpatient rehab due to the development of SVT.  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 Hospitalization complicated with multiple sclerosis treated with high dose systemic corticosteroids and plasma exchange, he required mechanical ventilation, and ultimately tracheostomy and PEG tube placement. During his hospitalization he was diagnosed with cholecystitis and cholecystectomy tube was placed.  At the inpatient rehab foley catheter was removed on 11/06/21 at 11 am, with no further urine output. He developed tachycardia 150 to 160 bpm prompting his transfer to the hospital.  At the time of his transfer his blood pressure was 118/59, HR 130 to 128, RR 31 and 02 saturation 97%, lungs with no wheezing or rhonchi, heart with S1 and S2 present and tachycardic, abdomen with no distention, cholecystectomy tube in place, no lower extremity edema.   Na 140, K 3,9 Cl 102 bicarbonate 23, glucose 250 bun 14 cr 1,15  Mag 1.5  ALK p 408, AST 1,227, ALT  535 T Bil 1,7  Lactic acid 3,2 and 2,4  Wbc 14,0 hgb 9,8 plt 215   Urine SG 1,023, 100 protein, > 50 rbc, 0-5 rbc   Blood cultures positive for E coli 2 out 2 bottles   Chest radiograph with no cardiomegaly, hypoinflation but no infiltrates.   EKG 165 bpm, normal axis, normal intervals, SVT rhythm, with no significant ST segment or T wave changes.   Abdominal US with decompressed gallbladder, cholecystectomy tube in place, with no other abnormalities.   Patient was placed on AV  blockade and antibiotic therapy  11/03 persistent urinary retention and foley cathter was reinserted.  11/04 Patient clinically improving, follow up CT abdomen and pelvis with no signs of deep infection.  11/05 E coli in blood cultures and Klebsiella in urine cultures, pan sensitive, change antibiotic to cefazolin.  11/06 patient clinically improved and medically stable to be transfer to CIR.  11/07 no current indication for surgical intervention at this time, plan to follow up as outpatient, continue drain care and antibiotic therapy as planned.   Assessment and Plan: * SVT (supraventricular tachycardia) Patient has converted to sinus rhythm Tolerating well metoprolol. Now off telemetry.  Out of bed to chair tid with meals, PT and OT.    Sepsis due to gram-negative UTI (HCC) Severe sepsis present on admission (lactic acidosis). Gram negative bacteremia.  Urinary retention (present on admission).  Blood culture positive for E coli, urine culture positive for Klebsiella. Sensitive to cephalosporins CT abdomen and pelvis with no signs of deep infection or collection.  Wbc 5,7  (Note that biliary drain was placed on 09/13 and culture was positive for E coli).  Case discussed with surgery, no current indication for cholecystectomy, continue drain care and close follow up as outpatient. Antibiotic therapy will be continued for at total of 10 days, 11/15/21.  IV Cefazolin as inpatient and Augmentin as outpatient.  Urinary retention, patient will need to keep foley cathter until follow up as outpatient with Urology.    Essential hypertension Systolic blood pressure 119 to 140 mmHg.    Plan to continue with metoprolol.  Continue close blood pressure monitoring.   History of acute cholecystitis Elevated liver enzymes and hyper bilirubinemia  Imaging with abdominal US with contracted gallbladder. Cholangiography per tube with appropriate positioned and functioning cholecystectomy. No  fluoroscopic evidence of cystic duct patency.  Follow up on CT abdomen and pelvis no acute changes or signs of deep infection.   At the time of his discharge liver enzymes continue to improve with AST at 47, ALT 177 and T Bill 0,6.  Continue drain care and close follow up with surgery. No current indication for cholecystectomy.   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   DM2 (diabetes mellitus, type 2) (HCC) Hyperglycemia.  Continue glucose cover and monitoring with insulin sliding scale.  Continue with basal insulin 12 units bid.  Patient is tolerating po well.  Hid fasting glucose this am was 148 mg/dl    Hyperlipidemia Hold on statin therapy until improvement in liver profile.   Hypomagnesemia Hypokalemia Electrolytes have been corrected, renal function continue to be stable. Patient off IV fluids.   Class 2 obesity Calculated BMI 35,9   MS (multiple sclerosis) ([place]) Patient has been recovering well in inpatient rehab Continue with PT and OT Trach and peg have been removed.   Anemia of chronic disease Hgb has been stable at 9 to 10 Continue close follow up as outpatient.         Subjective: Patient with no chest pain or dyspnea, no abdominal pain, continue very weak and deconditioned   Physical Exam: Vitals:   11/10/21 0754 11/10/21 1611 11/10/21 1927 11/11/21 0504  BP: 135/84 (!) 140/89 125/70 116/69  Pulse: 67 66 65 65  Resp: _0 Temp: (!) 97.4 F (36.3 C) 97.6 F (36.4 C) 97.6 F (36.4 C) 98.1 F (36.7 C)  TempSrc: Oral Oral Oral Oral  SpO2: 97% 96% 97% 96%  Weight:    117 kg   Neurology awake and alert ENT with mild pallor with no icterus Cardiovascular with S1 and S2 present and rhythmic Respiratory with no rales or wheezing Abdomen with no distention or tenderness, biliary drain in place No lower extremity edema  Data Reviewed:    Family  Communication: I spoke with patient's wife at the bedside, we talked in detail about patient's condition, plan of care and prognosis and all questions were addressed.   Disposition: Status is: Inpatient Remains inpatient appropriate because: pending insurance authorization to return to CIR   Planned Discharge Destination: Rehab      Author: Tawni Millers, MD 11/11/2021 11:03 AM  For on call review [email or web address].

## 2021-11-11 NOTE — Progress Notes (Addendum)
Inpatient Rehab Admissions Coordinator:  Await insurance authorization. Will continue to follow.   ADDENDUM: Received insurance authorization at [id number].  Gayland Curry, [place], [place] Admissions Coordinator [phone]

## 2021-11-11 NOTE — Care Management Important Message (Signed)
Important Message  Patient Details  Name: Troy Randall MRN: [id number] Date of Birth: [birth year]   Medicare Important Message Given:  Yes     Shelda Altes 11/11/2021, 9:27 AM

## 2021-11-11 NOTE — Assessment & Plan Note (Signed)
Hgb has been stable at 9 to 10 Continue close follow up as outpatient.

## 2021-11-12 ENCOUNTER — Other Ambulatory Visit: Payer: Self-pay

## 2021-11-12 ENCOUNTER — Encounter (HOSPITAL_COMMUNITY): Payer: Self-pay | Admitting: Physical Medicine & Rehabilitation

## 2021-11-12 ENCOUNTER — Inpatient Hospital Stay (HOSPITAL_COMMUNITY)
Admission: RE | Admit: 2021-11-12 | Discharge: 2021-11-26 | DRG: 945 | Disposition: A | Discharge status: Home Health | Payer: Medicare HMO | Source: Intra-hospital | Attending: Physical Medicine & Rehabilitation | Admitting: Physical Medicine & Rehabilitation

## 2021-11-12 DIAGNOSIS — E669 Obesity, unspecified: Secondary | ICD-10-CM | POA: Diagnosis present

## 2021-11-12 DIAGNOSIS — J9611 Chronic respiratory failure with hypoxia: Secondary | ICD-10-CM | POA: Diagnosis not present

## 2021-11-12 DIAGNOSIS — B9620 Unspecified Escherichia coli [E. coli] as the cause of diseases classified elsewhere: Secondary | ICD-10-CM | POA: Diagnosis not present

## 2021-11-12 DIAGNOSIS — I10 Essential (primary) hypertension: Secondary | ICD-10-CM | POA: Diagnosis present

## 2021-11-12 DIAGNOSIS — Z713 Dietary counseling and surveillance: Secondary | ICD-10-CM

## 2021-11-12 DIAGNOSIS — G35 Multiple sclerosis: Secondary | ICD-10-CM | POA: Diagnosis not present

## 2021-11-12 DIAGNOSIS — R411 Anterograde amnesia: Secondary | ICD-10-CM | POA: Diagnosis present

## 2021-11-12 DIAGNOSIS — Z8673 Personal history of transient ischemic attack (TIA), and cerebral infarction without residual deficits: Secondary | ICD-10-CM | POA: Diagnosis not present

## 2021-11-12 DIAGNOSIS — G8929 Other chronic pain: Secondary | ICD-10-CM | POA: Diagnosis not present

## 2021-11-12 DIAGNOSIS — M25511 Pain in right shoulder: Secondary | ICD-10-CM | POA: Diagnosis present

## 2021-11-12 DIAGNOSIS — I471 Supraventricular tachycardia, unspecified: Secondary | ICD-10-CM | POA: Diagnosis not present

## 2021-11-12 DIAGNOSIS — M545 Low back pain, unspecified: Secondary | ICD-10-CM | POA: Diagnosis not present

## 2021-11-12 DIAGNOSIS — E119 Type 2 diabetes mellitus without complications: Secondary | ICD-10-CM | POA: Diagnosis present

## 2021-11-12 DIAGNOSIS — K8060 Calculus of gallbladder and bile duct with cholecystitis, unspecified, without obstruction: Secondary | ICD-10-CM | POA: Diagnosis present

## 2021-11-12 DIAGNOSIS — N390 Urinary tract infection, site not specified: Secondary | ICD-10-CM | POA: Diagnosis present

## 2021-11-12 DIAGNOSIS — A4151 Sepsis due to Escherichia coli [E. coli]: Secondary | ICD-10-CM | POA: Diagnosis present

## 2021-11-12 DIAGNOSIS — Z888 Allergy status to other drugs, medicaments and biological substances status: Secondary | ICD-10-CM

## 2021-11-12 DIAGNOSIS — Z6834 Body mass index (BMI) 34.0-34.9, adult: Secondary | ICD-10-CM | POA: Diagnosis not present

## 2021-11-12 DIAGNOSIS — G0400 Acute disseminated encephalitis and encephalomyelitis, unspecified: Secondary | ICD-10-CM | POA: Diagnosis present

## 2021-11-12 DIAGNOSIS — G379 Demyelinating disease of central nervous system, unspecified: Secondary | ICD-10-CM | POA: Diagnosis present

## 2021-11-12 DIAGNOSIS — Z8249 Family history of ischemic heart disease and other diseases of the circulatory system: Secondary | ICD-10-CM

## 2021-11-12 DIAGNOSIS — D638 Anemia in other chronic diseases classified elsewhere: Secondary | ICD-10-CM | POA: Diagnosis present

## 2021-11-12 DIAGNOSIS — Z833 Family history of diabetes mellitus: Secondary | ICD-10-CM | POA: Diagnosis not present

## 2021-11-12 DIAGNOSIS — E785 Hyperlipidemia, unspecified: Secondary | ICD-10-CM | POA: Diagnosis not present

## 2021-11-12 DIAGNOSIS — M5440 Lumbago with sciatica, unspecified side: Secondary | ICD-10-CM | POA: Diagnosis not present

## 2021-11-12 DIAGNOSIS — F17290 Nicotine dependence, other tobacco product, uncomplicated: Secondary | ICD-10-CM | POA: Diagnosis present

## 2021-11-12 DIAGNOSIS — E1169 Type 2 diabetes mellitus with other specified complication: Secondary | ICD-10-CM | POA: Diagnosis not present

## 2021-11-12 DIAGNOSIS — Z794 Long term (current) use of insulin: Secondary | ICD-10-CM | POA: Diagnosis not present

## 2021-11-12 DIAGNOSIS — K529 Noninfective gastroenteritis and colitis, unspecified: Secondary | ICD-10-CM | POA: Diagnosis present

## 2021-11-12 DIAGNOSIS — R5381 Other malaise: Secondary | ICD-10-CM | POA: Diagnosis not present

## 2021-11-12 DIAGNOSIS — R7989 Other specified abnormal findings of blood chemistry: Secondary | ICD-10-CM | POA: Diagnosis not present

## 2021-11-12 DIAGNOSIS — M25512 Pain in left shoulder: Secondary | ICD-10-CM | POA: Diagnosis present

## 2021-11-12 DIAGNOSIS — Z434 Encounter for attention to other artificial openings of digestive tract: Secondary | ICD-10-CM | POA: Diagnosis not present

## 2021-11-12 DIAGNOSIS — Z741 Need for assistance with personal care: Secondary | ICD-10-CM | POA: Diagnosis not present

## 2021-11-12 DIAGNOSIS — Z87442 Personal history of urinary calculi: Secondary | ICD-10-CM

## 2021-11-12 DIAGNOSIS — D649 Anemia, unspecified: Secondary | ICD-10-CM | POA: Diagnosis not present

## 2021-11-12 DIAGNOSIS — Z79899 Other long term (current) drug therapy: Secondary | ICD-10-CM

## 2021-11-12 DIAGNOSIS — K810 Acute cholecystitis: Secondary | ICD-10-CM

## 2021-11-12 DIAGNOSIS — F419 Anxiety disorder, unspecified: Secondary | ICD-10-CM | POA: Diagnosis present

## 2021-11-12 LAB — GLUCOSE, CAPILLARY
Glucose-Capillary: 101 mg/dL — ABNORMAL HIGH (ref 70–99)
Glucose-Capillary: 205 mg/dL — ABNORMAL HIGH (ref 70–99)
Glucose-Capillary: 110 mg/dL — ABNORMAL HIGH (ref 70–99)
Glucose-Capillary: 172 mg/dL — ABNORMAL HIGH (ref 70–99)

## 2021-11-12 MED ORDER — ACETAMINOPHEN 325 MG PO TABS
325.0000 mg | ORAL_TABLET | ORAL | Status: DC | PRN
  Administered 2021-11-16 – 2021-11-25 (×12): 650 mg via ORAL
  Filled 2021-11-12 (×13): qty 2

## 2021-11-12 MED ORDER — AMOXICILLIN-POT CLAVULANATE 875-125 MG PO TABS
1.0000 | ORAL_TABLET | Freq: Two times a day (BID) | ORAL | Status: AC
  Administered 2021-11-12 – 2021-11-15 (×7): 1 via ORAL
  Filled 2021-11-12 (×7): qty 1

## 2021-11-12 MED ORDER — AMOXICILLIN-POT CLAVULANATE 875-125 MG PO TABS: 1.0000 | ORAL_TABLET | Freq: Two times a day (BID) | ORAL | 0 refills | Status: DC

## 2021-11-12 MED ORDER — INSULIN ASPART 100 UNIT/ML IJ SOLN
0.0000 [IU] | Freq: Three times a day (TID) | INTRAMUSCULAR | Status: DC
  Administered 2021-11-13: 7 [IU] via SUBCUTANEOUS
  Administered 2021-11-13: 1 [IU] via SUBCUTANEOUS
  Administered 2021-11-13: 2 [IU] via SUBCUTANEOUS
  Administered 2021-11-14: 3 [IU] via SUBCUTANEOUS
  Administered 2021-11-14: 1 [IU] via SUBCUTANEOUS
  Administered 2021-11-14: 3 [IU] via SUBCUTANEOUS
  Administered 2021-11-15: 2 [IU] via SUBCUTANEOUS
  Administered 2021-11-15: 1 [IU] via SUBCUTANEOUS
  Administered 2021-11-15: 3 [IU] via SUBCUTANEOUS
  Administered 2021-11-16: 1 [IU] via SUBCUTANEOUS
  Administered 2021-11-16: 3 [IU] via SUBCUTANEOUS
  Administered 2021-11-17: 2 [IU] via SUBCUTANEOUS
  Administered 2021-11-17: 1 [IU] via SUBCUTANEOUS
  Administered 2021-11-17: 3 [IU] via SUBCUTANEOUS
  Administered 2021-11-18: 1 [IU] via SUBCUTANEOUS
  Administered 2021-11-18 – 2021-11-19 (×3): 3 [IU] via SUBCUTANEOUS
  Administered 2021-11-19: 2 [IU] via SUBCUTANEOUS
  Administered 2021-11-19 – 2021-11-20 (×2): 1 [IU] via SUBCUTANEOUS
  Administered 2021-11-20 (×2): 2 [IU] via SUBCUTANEOUS
  Administered 2021-11-21 (×2): 1 [IU] via SUBCUTANEOUS
  Administered 2021-11-22: 2 [IU] via SUBCUTANEOUS
  Administered 2021-11-22 – 2021-11-23 (×3): 1 [IU] via SUBCUTANEOUS
  Administered 2021-11-23: 2 [IU] via SUBCUTANEOUS
  Administered 2021-11-24: 1 [IU] via SUBCUTANEOUS
  Administered 2021-11-24: 2 [IU] via SUBCUTANEOUS

## 2021-11-12 MED ORDER — FLEET ENEMA 7-19 GM/118ML RE ENEM: 1.0000 | ENEMA | Freq: Once | RECTAL | Status: DC | PRN

## 2021-11-12 MED ORDER — METOPROLOL TARTRATE 25 MG PO TABS: 25.0000 mg | ORAL_TABLET | Freq: Two times a day (BID) | ORAL | Status: DC

## 2021-11-12 MED ORDER — HYDROXYZINE HCL 10 MG PO TABS: 10.0000 mg | ORAL_TABLET | Freq: Three times a day (TID) | ORAL | Status: DC | PRN

## 2021-11-12 MED ORDER — INSULIN GLARGINE-YFGN 100 UNIT/ML ~~LOC~~ SOLN
12.0000 [IU] | Freq: Two times a day (BID) | SUBCUTANEOUS | Status: DC
  Administered 2021-11-12 – 2021-11-14 (×4): 12 [IU] via SUBCUTANEOUS
  Filled 2021-11-12 (×6): qty 0.12

## 2021-11-12 MED ORDER — GUAIFENESIN-DM 100-10 MG/5ML PO SYRP: 5.0000 mL | ORAL_SOLUTION | Freq: Four times a day (QID) | ORAL | Status: DC | PRN

## 2021-11-12 MED ORDER — ENOXAPARIN SODIUM 40 MG/0.4ML IJ SOSY
40.0000 mg | PREFILLED_SYRINGE | INTRAMUSCULAR | Status: DC
  Administered 2021-11-12 – 2021-11-25 (×14): 40 mg via SUBCUTANEOUS
  Filled 2021-11-12 (×13): qty 0.4

## 2021-11-12 MED ORDER — PROCHLORPERAZINE MALEATE 5 MG PO TABS: 5.0000 mg | ORAL_TABLET | Freq: Four times a day (QID) | ORAL | Status: DC | PRN

## 2021-11-12 MED ORDER — TRAZODONE HCL 50 MG PO TABS
25.0000 mg | ORAL_TABLET | Freq: Every evening | ORAL | Status: DC | PRN
  Administered 2021-11-14 – 2021-11-25 (×3): 50 mg via ORAL
  Filled 2021-11-12 (×3): qty 1

## 2021-11-12 MED ORDER — METHOCARBAMOL 500 MG PO TABS
500.0000 mg | ORAL_TABLET | Freq: Four times a day (QID) | ORAL | Status: DC | PRN
  Administered 2021-11-19 – 2021-11-25 (×8): 500 mg via ORAL
  Filled 2021-11-12 (×10): qty 1

## 2021-11-12 MED ORDER — PROCHLORPERAZINE EDISYLATE 10 MG/2ML IJ SOLN: 5.0000 mg | Freq: Four times a day (QID) | INTRAMUSCULAR | Status: DC | PRN

## 2021-11-12 MED ORDER — PROCHLORPERAZINE 25 MG RE SUPP: 12.5000 mg | Freq: Four times a day (QID) | RECTAL | Status: DC | PRN

## 2021-11-12 MED ORDER — SORBITOL 70 % SOLN: 30.0000 mL | Freq: Every day | Status: DC | PRN

## 2021-11-12 MED ORDER — MAGNESIUM HYDROXIDE 400 MG/5ML PO SUSP: 30.0000 mL | Freq: Every day | ORAL | Status: DC | PRN

## 2021-11-12 MED ORDER — HYDROXYZINE HCL 10 MG PO TABS: 10.0000 mg | ORAL_TABLET | Freq: Three times a day (TID) | ORAL | 0 refills | Status: DC | PRN

## 2021-11-12 MED ORDER — FAMOTIDINE 20 MG PO TABS
20.0000 mg | ORAL_TABLET | Freq: Two times a day (BID) | ORAL | Status: DC
  Administered 2021-11-12 – 2021-11-16 (×8): 20 mg via ORAL
  Filled 2021-11-12 (×8): qty 1

## 2021-11-12 MED ORDER — ALUM & MAG HYDROXIDE-SIMETH 200-200-20 MG/5ML PO SUSP: 30.0000 mL | ORAL | Status: DC | PRN

## 2021-11-12 MED ORDER — METOPROLOL TARTRATE 25 MG PO TABS
25.0000 mg | ORAL_TABLET | Freq: Two times a day (BID) | ORAL | Status: DC
  Administered 2021-11-12 – 2021-11-26 (×28): 25 mg via ORAL
  Filled 2021-11-12 (×28): qty 1

## 2021-11-12 NOTE — TOC Transition Note (Signed)
Transition of Care [facility]) - CM/SW Discharge Note   Patient Details  Name: Troy Randall MRN: [id number] Date of Birth: 1960/08/29  [facility] CM/SW Contact:  Leone Haven, RN Phone Number: 11/12/2021, 10:55 AM   Clinical Narrative:    Patient is for dc today to CIR.          Patient Goals and CMS Choice        Discharge Placement                       Discharge Plan and Services                                     Social Determinants of Health (SDOH) Interventions     Readmission Risk Interventions     No data to display

## 2021-11-12 NOTE — Progress Notes (Signed)
   11/12/21 1048  Mobility  Activity Ambulated with assistance in room  Level of Assistance Moderate assist, patient does 50-74% (+2)  Assistive Device Front wheel walker  Distance Ambulated (ft) 25 ft  Activity Response Tolerated well  Mobility Referral Yes  $Mobility charge 1 Mobility   Mobility Specialist Progress Note  Pt was in chair and agreeable. Requires multiple cues to turn walker. Had no c/o pain throughout ambulation. Returned to bed w/ all needs met and call bell in reach.   Lucious Groves Mobility Specialist

## 2021-11-12 NOTE — H&P (Signed)
Physical Medicine and Rehabilitation Admission H&P  CC: Functional deficits secondary to urosepsis, acute disseminated encephalomyelitis, prolonged hospitalization secondary to GI bleed and acute respiratory failure  HPI: Troy Randall is a 61 year old R handed  male who presented to [facility] emergency department on 09/04/2021 reporting hematochezia associated with diarrhea and abdominal cramping.  He has a past medical history of CVA x2 ( had fully recovered from both- latest 4/23) maintained on Plavix and aspirin.  He had had a normal colonoscopy in February 2023.  Gastroenterology was consulted.  He underwent EGD with deployment of endoscopic capsule and colonoscopy on 9/2.  Interventional radiology was consulted and he underwent coil of the jejunal branch of the SMA on 9/2. Active bleeding noted in the proximal jejunum which was tattooed and hemostatic clip placed.  He underwent flexible sigmoidoscopy on 9/15 to retrieve endoscopic capsule and colonoscopy was performed on 10/3 with findings of large superficial ulceration of the sigmoid colon.  This was biopsied and consistent with chronic colitis.          He required approximately 20 units of packed red blood cells.  His hemoglobin is stable and improving. His hospitalization was complicated by cholecystitis and he underwent percutaneous cholecystostomy tube placement, acute renal failure requiring CRRT and respiratory failure with subsequent tracheostomy 9/11.  LFTs and renal function are normal. CT head performed secondary to acute encephalopathy and history of prior CVA.  MRI of the brain performed secondary to multiple interval white matter insults versus inflammatory process.  Neurology consultation obtained on 9/17.  The patient underwent laboratory work-up including lumbar puncture.  He was treated with IV steroids with mild improvement.  He then underwent full course of plasma exchange completed on 10/11.  MRI completed of the cervical and  thoracic spine.  He will follow-up with neurology as outpatient.   His Foley catheter was removed on 10/9 however he was unable to void spontaneously and his catheter was reinserted 10/10.  He was admitted to rehab 10/30/2021 and noted to have increased frequency of stools and C. difficile toxin checked.  Protonix increased to twice daily dosing.  C. difficile toxin negative.  Follow-up labs stable on 10/30.  Patient was ambulating 95 feet with physical therapy and due to history of GI bleed, Lovenox was discontinued and SCDs applied. Flomax discontinued and doxazosin continued for urinary retention. Complained of low back pain and bilateral shoulder pain and Lidoderm patches were changed to 3 patches from 8 PM to 8 AM and to continue Tylenol.  Continued on dysphagia 3 diet with nectar thick liquids. Voiding trial on 11/2. At approximately 6:30 pm, RN noted rigors and rectal temp of 100. Sepsis work-up initiated. Recheck of vitals signs with heart rate in 160s. Rapid response called. Transferred to acute care/[name] service to monitored bed for SVT and sepsis work-up.  Blood cultures were positive for E. coli.  Intravenous cephalosporins initiated.  CT of the abdomen pelvis without signs of deep infection or collection.  He has remained in sinus rhythm on metoprolol.  Intravenous cefazolin changed to Augmentin which she will continue through 11/11.  Foley catheter remains in place.  Elevated liver function tests trending downward.  Continue drain care.  H&H have remained stable that reports of recurrent GI bleeding.The patient requires inpatient physical medicine and rehabilitation evaluations and treatment secondary to dysfunction due to multiple medical issues including disseminated encephalomyelitis, urosepsis. Patient feels ready to return to CIR today.   Review of Systems  Reason unable to perform  ROS: not oriented.   Past Medical History:  Diagnosis Date   Anemia    Arthritis    CVA (cerebral vascular  accident) (HCC)    Diabetes mellitus without complication (HCC)    Hyperlipidemia    Hypertension    Kidney stones    Past Surgical History:  Procedure Laterality Date   ANKLE SURGERY     BIOPSY  09/10/2021   Procedure: BIOPSY;  Surgeon: Jeani Hawking, MD;  Location: [facility] ENDOSCOPY;  Service: Gastroenterology;;   BIOPSY  10/07/2021   Procedure: BIOPSY;  Surgeon: Jeani Hawking, MD;  Location: [facility] ENDOSCOPY;  Service: Gastroenterology;;   COLONOSCOPY N/A 10/07/2021   Procedure: COLONOSCOPY;  Surgeon: Jeani Hawking, MD;  Location: [facility] ENDOSCOPY;  Service: Gastroenterology;  Laterality: N/A;   COLONOSCOPY WITH PROPOFOL N/A 09/06/2021   Procedure: COLONOSCOPY WITH PROPOFOL;  Surgeon: Dolores Frame, MD;  Location: [facility] ENDO SUITE;  Service: Gastroenterology;  Laterality: N/A;   ENTEROSCOPY N/A 09/10/2021   Procedure: ENTEROSCOPY;  Surgeon: Jeani Hawking, MD;  Location: [facility] ENDOSCOPY;  Service: Gastroenterology;  Laterality: N/A;   ENTEROSCOPY  09/06/2021   Procedure: ENTEROSCOPY;  Surgeon: Dolores Frame, MD;  Location: [facility] ENDO SUITE;  Service: Gastroenterology;;   ESOPHAGOGASTRODUODENOSCOPY (EGD) WITH PROPOFOL  09/06/2021   Procedure: ESOPHAGOGASTRODUODENOSCOPY (EGD) WITH PROPOFOL;  Surgeon: Dolores Frame, MD;  Location: [facility] ENDO SUITE;  Service: Gastroenterology;;   Wenda Low SIGMOIDOSCOPY N/A 09/19/2021   Procedure: Arnell Sieving;  Surgeon: Jeani Hawking, MD;  Location: [facility] ENDOSCOPY;  Service: Gastroenterology;  Laterality: N/A;   FOREIGN BODY REMOVAL  09/19/2021   Procedure: FOREIGN BODY REMOVAL;  Surgeon: Jeani Hawking, MD;  Location: [facility] ENDOSCOPY;  Service: Gastroenterology;;   Emelda Brothers CAPSULE STUDY  09/06/2021   Procedure: GIVENS CAPSULE STUDY;  Surgeon: Dolores Frame, MD;  Location: [facility] ENDO SUITE;  Service: Gastroenterology;;   HERNIA REPAIR     umbilical x1 Incisional x1   INCISIONAL HERNIA REPAIR  04/13/2011   Procedure: LAPAROSCOPIC INCISIONAL HERNIA;   Surgeon: Dalia Heading, MD;  Location: [facility] ORS;  Service: General;  Laterality: N/A;  Recurrent Laparoscopic Incisional Herniorraphy with Mesh   IR ANGIOGRAM SELECTIVE EACH ADDITIONAL VESSEL  09/09/2021   IR ANGIOGRAM VISCERAL SELECTIVE  09/07/2021   IR ANGIOGRAM VISCERAL SELECTIVE  09/06/2021   IR ANGIOGRAM VISCERAL SELECTIVE  09/06/2021   IR CHOLANGIOGRAM EXISTING TUBE  11/06/2021   IR EMBO ART  VEN HEMORR LYMPH EXTRAV  INC GUIDE ROADMAPPING  09/06/2021   IR EXCHANGE BILIARY DRAIN  10/24/2021   IR GUIDED DRAIN W CATHETER PLACEMENT  09/07/2021   IR US GUIDE BX ASP/DRAIN  09/07/2021   IR US GUIDE VASC ACCESS RIGHT  09/07/2021   IR US GUIDE VASC ACCESS RIGHT  09/06/2021   KIDNEY STONE SURGERY     Family History  Problem Relation Age of Onset   Heart failure Mother    Diabetes Father    Cancer Other    Heart attack Other    Anesthesia problems Neg Hx    Hypotension Neg Hx    Malignant hyperthermia Neg Hx    Pseudochol deficiency Neg Hx    Colon cancer Neg Hx    Inflammatory bowel disease Neg Hx    Social History:  reports that he has never smoked. His smokeless tobacco use includes snuff. He reports that he does not drink alcohol and does not use drugs. Allergies:  Allergies  Allergen Reactions   Metformin And Related     GI upset   Medications Prior  to Admission  Medication Sig Dispense Refill   amoxicillin-clavulanate (AUGMENTIN) 875-125 MG tablet Take 1 tablet by mouth every 12 (twelve) hours for 4 days. 8 tablet 0   BD VEO INSULIN SYRINGE U/F 31G X 15/64" 1 ML MISC  USE AS DIRECTED 100 each 2   glipiZIDE (GLUCOTROL XL) 10 MG 24 hr tablet Take 1 tablet (10 mg total) by mouth daily with breakfast. 90 tablet 1   glucose blood (ONETOUCH VERIO) test strip TEST twice a day 100 each 3   hydrOXYzine (ATARAX) 10 MG tablet Take 1 tablet (10 mg total) by mouth 3 (three) times daily as needed for anxiety. 30 tablet 0   Insulin Pen Needle (NOVOTWIST) 32G X 5 MM MISC Use two daily to inject Victoza and  Toujeo. 90 each 5   metFORMIN (GLUCOPHAGE-XR) 500 MG 24 hr tablet Take 500-1,000 mg by mouth in the morning and at bedtime.     metoprolol tartrate (LOPRESSOR) 25 MG tablet Take 1 tablet (25 mg total) by mouth 2 (two) times daily.     ONETOUCH DELICA LANCETS 99991111 MISC Use to check blood sugar once a day dx code E11.65 50 each 3   pantoprazole (PROTONIX) 40 MG tablet Take 1 tablet (40 mg total) by mouth 2 (two) times daily before a meal. 30 tablet 1   prochlorperazine (COMPAZINE) 10 MG/2ML injection Inject 1 mL (5 mg total) into the vein every 6 (six) hours as needed. 240 mL    rosuvastatin (CRESTOR) 40 MG tablet Take 1 tablet (40 mg total) by mouth daily. 30 tablet 1   TRESIBA FLEXTOUCH 200 UNIT/ML FlexTouch Pen Inject 20 Units into the skin in the morning and at bedtime.     Expand All Collapse All       Physical Medicine and Rehabilitation Admission H&P   CC: Functional deficits secondary to urosepsis, acute disseminated encephalomyelitis, prolonged hospitalization secondary to GI bleed and acute respiratory failure   HPI: Troy Randall is a 61 year old R handed  male who presented to [facility] emergency department on 09/04/2021 reporting hematochezia associated with diarrhea and abdominal cramping.  He has a past medical history of CVA x2 ( had fully recovered from both- latest 4/23) maintained on Plavix and aspirin.  He had had a normal colonoscopy in February 2023.  Gastroenterology was consulted.  He underwent EGD with deployment of endoscopic capsule and colonoscopy on 9/2.  Interventional radiology was consulted and he underwent coil of the jejunal branch of the SMA on 9/2. Active bleeding noted in the proximal jejunum which was tattooed and hemostatic clip placed.  He underwent flexible sigmoidoscopy on 9/15 to retrieve endoscopic capsule and colonoscopy was performed on 10/3 with findings of large superficial ulceration of the sigmoid colon.  This was biopsied and consistent with chronic  colitis.          He required approximately 20 units of packed red blood cells.  His hemoglobin is stable and improving. His hospitalization was complicated by cholecystitis and he underwent percutaneous cholecystostomy tube placement, acute renal failure requiring CRRT and respiratory failure with subsequent tracheostomy 9/11.  LFTs and renal function are normal. CT head performed secondary to acute encephalopathy and history of prior CVA.  MRI of the brain performed secondary to multiple interval white matter insults versus inflammatory process.  Neurology consultation obtained on 9/17.  The patient underwent laboratory work-up including lumbar puncture.  He was treated with IV steroids with mild improvement.  He then underwent full course of plasma exchange  completed on 10/11.  MRI completed of the cervical and thoracic spine.  He will follow-up with neurology as outpatient.   His Foley catheter was removed on 10/9 however he was unable to void spontaneously and his catheter was reinserted 10/10.   He was admitted to rehab 10/30/2021 and noted to have increased frequency of stools and C. difficile toxin checked.  Protonix increased to twice daily dosing.  C. difficile toxin negative.  Follow-up labs stable on 10/30.  Patient was ambulating 95 feet with physical therapy and due to history of GI bleed, Lovenox was discontinued and SCDs applied. Flomax discontinued and doxazosin continued for urinary retention. Complained of low back pain and bilateral shoulder pain and Lidoderm patches were changed to 3 patches from 8 PM to 8 AM and to continue Tylenol.  Continued on dysphagia 3 diet with nectar thick liquids. Voiding trial on 11/2. At approximately 6:30 pm, RN noted rigors and rectal temp of 100. Sepsis work-up initiated. Recheck of vitals signs with heart rate in 160s. Rapid response called. Transferred to acute care/[name] service to monitored bed for SVT and sepsis work-up.  Blood cultures were positive for  E. coli.  Intravenous cephalosporins initiated.  CT of the abdomen pelvis without signs of deep infection or collection.  He has remained in sinus rhythm on metoprolol.  Intravenous cefazolin changed to Augmentin which she will continue through 11/11.  Foley catheter remains in place.  Elevated liver function tests trending downward.  Continue drain care.  H&H have remained stable that reports of recurrent GI bleeding.The patient requires inpatient physical medicine and rehabilitation evaluations and treatment secondary to dysfunction due to multiple medical issues including disseminated encephalomyelitis, urosepsis.   Review of Systems  Reason unable to perform ROS: not oriented.        Past Medical History:  Diagnosis Date   Anemia     Arthritis     CVA (cerebral vascular accident) (HCC)     Diabetes mellitus without complication (HCC)     Hyperlipidemia     Hypertension     Kidney stones           Past Surgical History:  Procedure Laterality Date   ANKLE SURGERY       BIOPSY   09/10/2021    Procedure: BIOPSY;  Surgeon: Jeani Hawking, MD;  Location: [facility] ENDOSCOPY;  Service: Gastroenterology;;   BIOPSY   10/07/2021    Procedure: BIOPSY;  Surgeon: Jeani Hawking, MD;  Location: [facility] ENDOSCOPY;  Service: Gastroenterology;;   COLONOSCOPY N/A 10/07/2021    Procedure: COLONOSCOPY;  Surgeon: Jeani Hawking, MD;  Location: [facility] ENDOSCOPY;  Service: Gastroenterology;  Laterality: N/A;   COLONOSCOPY WITH PROPOFOL N/A 09/06/2021    Procedure: COLONOSCOPY WITH PROPOFOL;  Surgeon: Dolores Frame, MD;  Location: [facility] ENDO [facility];  Service: Gastroenterology;  Laterality: N/A;   ENTEROSCOPY N/A 09/10/2021    Procedure: ENTEROSCOPY;  Surgeon: Jeani Hawking, MD;  Location: [facility] ENDOSCOPY;  Service: Gastroenterology;  Laterality: N/A;   ENTEROSCOPY   09/06/2021    Procedure: ENTEROSCOPY;  Surgeon: Dolores Frame, MD;  Location: [facility] ENDO [facility];  Service: Gastroenterology;;   ESOPHAGOGASTRODUODENOSCOPY  (EGD) WITH PROPOFOL   09/06/2021    Procedure: ESOPHAGOGASTRODUODENOSCOPY (EGD) WITH PROPOFOL;  Surgeon: Dolores Frame, MD;  [facility]: [facility] END[facility];  Service: Gastroenterology;;   Wenda Low SIGMOIDOSCOPY N/A 09/19/2021    Procedure: Arnell Sieving;  Surgeon: Jeani Hawking, MD;  Location: [facility] ENDOSCOPY;  Service: Gastroenterology;  Laterality: N/A;   FOREIGN BODY REMOVAL  09/19/2021    Procedure: FOREIGN BODY REMOVAL;  Surgeon: Carol Ada, MD;  Location: [facility] ENDOSCOPY;  Service: Gastroenterology;;   Freda Munro CAPSULE STUDY   09/06/2021    Procedure: GIVENS CAPSULE STUDY;  Surgeon: Harvel Quale, MD;  Location: [facility];  Service: Gastroenterology;;   HERNIA REPAIR        umbilical x1 Incisional x1   INCISIONAL HERNIA REPAIR   04/13/2011    Procedure: LAPAROSCOPIC INCISIONAL HERNIA;  Surgeon: Jamesetta So, MD;  Location: [facility] ORS;  Service: General;  Laterality: N/A;  Recurrent Laparoscopic Incisional Herniorraphy with Mesh   IR ANGIOGRAM SELECTIVE EACH ADDITIONAL VESSEL   09/09/2021   IR ANGIOGRAM VISCERAL SELECTIVE   09/07/2021   IR ANGIOGRAM VISCERAL SELECTIVE   09/06/2021   IR ANGIOGRAM VISCERAL SELECTIVE   09/06/2021   IR CHOLANGIOGRAM EXISTING TUBE   11/06/2021   IR EMBO ART  VEN HEMORR LYMPH EXTRAV  INC GUIDE ROADMAPPING   09/06/2021   IR EXCHANGE BILIARY DRAIN   10/24/2021   IR GUIDED DRAIN W CATHETER PLACEMENT   09/07/2021   IR US GUIDE BX ASP/DRAIN   09/07/2021   IR US GUIDE VASC ACCESS RIGHT   09/07/2021   IR US GUIDE VASC ACCESS RIGHT   09/06/2021   KIDNEY STONE SURGERY             Family History  Problem Relation Age of Onset   Heart failure Mother     Diabetes Father     Cancer Other     Heart attack Other     Anesthesia problems Neg Hx     Hypotension Neg Hx     Malignant hyperthermia Neg Hx     Pseudochol deficiency Neg Hx     Colon cancer Neg Hx     Inflammatory bowel disease Neg Hx      Social History:  reports that he has never smoked. His smokeless  tobacco use includes snuff. He reports that he does not drink alcohol and does not use drugs. Allergies:       Allergies  Allergen Reactions   Metformin And Related        GI upset   Penicillins Hives          Medications Prior to Admission  Medication Sig Dispense Refill   BD VEO INSULIN SYRINGE U/F 31G X 15/64" 1 ML MISC  USE AS DIRECTED 100 each 2   doxazosin (CARDURA) 2 MG tablet Take 1 tablet (2 mg total) by mouth daily.       glipiZIDE (GLUCOTROL XL) 10 MG 24 hr tablet Take 1 tablet (10 mg total) by mouth daily with breakfast. 90 tablet 1   glucose blood (ONETOUCH VERIO) test strip TEST twice a day 100 each 3   Insulin Pen Needle (NOVOTWIST) 32G X 5 MM MISC Use two daily to inject Victoza and Toujeo. 90 each 5   levocetirizine (XYZAL) 5 MG tablet Take 1 tablet by mouth at bedtime.       metFORMIN (GLUCOPHAGE-XR) 500 MG 24 hr tablet Take 500-1,000 mg by mouth in the morning and at bedtime.       ONETOUCH DELICA LANCETS 99991111 MISC Use to check blood sugar once a day dx code E11.65 50 each 3   pantoprazole (PROTONIX) 40 MG tablet Take 1 tablet (40 mg total) by mouth 2 (two) times daily before a meal. 30 tablet 1   rosuvastatin (CRESTOR) 40 MG tablet Take 1 tablet (40 mg total) by mouth  daily. 30 tablet 1   TRESIBA FLEXTOUCH 200 UNIT/ML FlexTouch Pen Inject 20 Units into the skin in the morning and at bedtime.              Home: Home Living Family/patient expects to be discharged to:: Inpatient rehab Living Arrangements: Spouse/significant other Available Help at Discharge: Family, Available 24 hours/day Type of Home: House Home Access: Stairs to enter [employer] of Steps: 2 Entrance Stairs-Rails: Left Home Layout: Able to live on main level with bedroom/bathroom, Laundry or work area in basement [employer] Shower/Tub: Multimedia programmer: Handicapped height Bathroom Accessibility: No  Lives With: Spouse   Functional History: Prior Function Prior Level  of Function : Needs assist Mobility Comments: walking with RW and assist at AIR, ADLs Comments: needing assist at AIR   Functional Status:  Mobility: Bed Mobility Overal bed mobility: Needs Assistance Bed Mobility: Supine to Sit Supine to sit: Mod assist, HOB elevated Sit to supine: Mod assist General bed mobility comments: modA to initiate movement of LE, then pt able to elevate trunk from elevated HOB. increased time an d cues.  Pt had difficult time moving up laterally in bed. Unable to follow cues to do so.  Required max assist to scoot up in bed on first trial but once pt knew what we were doing pt moved up with min assist. Transfers Overall transfer level: Needs assistance Equipment used: Rolling walker (2 wheels) Transfers: Sit to/from Stand Sit to Stand: Min assist, Min guard Bed to/from chair/wheelchair/BSC transfer type:: Step pivot Step pivot transfers: Mod assist General transfer comment: minA of 1-2 to rise from EOB, minA-minG to rise to standing from armchair. pt needing increased cues, assist, and assist to manage RW with pivot to return to sitting EOB Ambulation/Gait Ambulation/Gait assistance: Mod assist, +2 safety/equipment, Min assist Gait Distance (Feet): 10 Feet (+ 35 ft + 15 ft + 50 ft) Assistive device: Rolling walker (2 wheels) Gait Pattern/deviations: Decreased stride length, Ataxic, Staggering left, Drifts right/left, Narrow base of support General Gait Details: R knee buckling/hyperextending at times, min-modA to maintain upright. drifts to L and needed assist to avoid obstacles. poor visual scanning Gait velocity: decreased Gait velocity interpretation: <1.31 ft/sec, indicative of household ambulator   ADL: ADL Overall ADL's : Needs assistance/impaired Eating/Feeding: Minimal assistance, Sitting Eating/Feeding Details (indicate cue type and reason): Pt used spoon and fork to feed self potatoes and sandwhich during this session sitting on EOB. Walked in to  wife feeding pt and spoke to her about letting pt feed self as he is able and this is a great tasks to feel independent with and to work on cognition with. Grooming: Oral care, Wash/dry hands, Minimal assistance, Standing Grooming Details (indicate cue type and reason): Pt stood at sink with min assist to occasional mod assist with posterior lean and R lean a times. Pt required step by step instructions as pt was distrtacted by his walker and other thins in room outside of task at hand. Pt with mild perseveration at end of tasks with cleaning toothbrush and needed one VC to put the brush down. Upper Body Bathing: Moderate assistance Lower Body Bathing: Moderate assistance Upper Body Dressing : Moderate assistance Lower Body Dressing: Maximal assistance Toilet Transfer: Minimal assistance, Rolling walker (2 wheels), Ambulation, Regular Toilet Toilet Transfer Details (indicate cue type and reason): simulated via functional mobility Toileting- Clothing Manipulation and Hygiene: Total assistance Toileting - Clothing Manipulation Details (indicate cue type and reason): catheter Functional mobility during ADLs: Moderate assistance,  Rolling walker (2 wheels) General ADL Comments: Pt required mod assist to mobilize today due to running into items on L side in hallway. See PT note.   Cognition: Cognition Overall Cognitive Status: Impaired/Different from baseline Orientation Level: Oriented to person, Oriented to place Cognition Arousal/Alertness: Awake/alert Behavior During Therapy: Flat affect Overall Cognitive Status: Impaired/Different from baseline Area of Impairment: Attention, Memory, Following commands, Safety/judgement, Awareness, Problem solving Current Attention Level: Focused Memory: Decreased recall of precautions, Decreased short-term memory Following Commands: Follows one step commands inconsistently, Follows one step commands with increased time Safety/Judgement: Decreased awareness  of safety, Decreased awareness of deficits Awareness: Intellectual Problem Solving: Slow processing, Decreased initiation, Difficulty sequencing, Requires verbal cues, Requires tactile cues General Comments: pt attempting to follow commands, needing increased time and cues to complete. states "yeah" or "I am" but then does not follow through with instruction at times. Improved sequencing of familiar tasks, poor attention to L side that progressed with fatigue.      Physical Exam: Blood pressure 131/79, pulse 66, temperature (!) 97.4 F (36.3 C), temperature source Oral, resp. rate 18, height 6' (1.829 m), weight 116.2 kg, SpO2 98 %. Physical Exam Constitutional:      General: He is not in acute distress. HENT:     Head: Normocephalic and atraumatic.  Eyes:     Extraocular Movements: Extraocular movements intact.     Pupils: Pupils are equal, round, and reactive to light.  Cardiovascular:     Rate and Rhythm: Normal rate and regular rhythm.  Abdominal:     General: Bowel sounds are normal. There is no distension.     Palpations: Abdomen is soft.     Comments: Clear bilious drainage in bile bag  Musculoskeletal:     Right lower leg: No edema.     Left lower leg: No edema.  Skin:    General: Skin is warm and dry.  Neurological:     Mental Status: He is alert.     Comments: Oriented to name and birthdate; thinks he is in [place]. 4/5 strength throughout.    Results for orders placed or performed during the hospital encounter of 11/06/21 (from the past 48 hour(s))  Glucose, capillary     Status: Abnormal   Collection Time: 11/10/21  4:08 PM  Result Value Ref Range   Glucose-Capillary 195 (H) 70 - 99 mg/dL    Comment: Glucose reference range applies only to samples taken after fasting for at least 8 hours.  Glucose, capillary     Status: Abnormal   Collection Time: 11/11/21  6:14 AM  Result Value Ref Range   Glucose-Capillary 141 (H) 70 - 99 mg/dL    Comment: Glucose  reference range applies only to samples taken after fasting for at least 8 hours.   Comment 1 Notify RN    Comment 2 Document in Chart   Glucose, capillary     Status: Abnormal   Collection Time: 11/11/21 11:32 AM  Result Value Ref Range   Glucose-Capillary 174 (H) 70 - 99 mg/dL    Comment: Glucose reference range applies only to samples taken after fasting for at least 8 hours.  Glucose, capillary     Status: Abnormal   Collection Time: 11/11/21  5:33 PM  Result Value Ref Range   Glucose-Capillary 155 (H) 70 - 99 mg/dL    Comment: Glucose reference range applies only to samples taken after fasting for at least 8 hours.  Glucose, capillary     Status: Abnormal  Collection Time: 11/11/21  9:55 PM  Result Value Ref Range   Glucose-Capillary 128 (H) 70 - 99 mg/dL    Comment: Glucose reference range applies only to samples taken after fasting for at least 8 hours.   Comment 1 Notify RN    Comment 2 Document in Chart   Glucose, capillary     Status: Abnormal   Collection Time: 11/12/21  6:23 AM  Result Value Ref Range   Glucose-Capillary 101 (H) 70 - 99 mg/dL    Comment: Glucose reference range applies only to samples taken after fasting for at least 8 hours.   Comment 1 Notify RN    Comment 2 Document in Chart   Glucose, capillary     Status: Abnormal   Collection Time: 11/12/21 11:17 AM  Result Value Ref Range   Glucose-Capillary 205 (H) 70 - 99 mg/dL    Comment: Glucose reference range applies only to samples taken after fasting for at least 8 hours.   No results found.    Blood pressure 131/79, pulse 66, temperature (!) 97.4 F (36.3 C), temperature source Oral, resp. rate 18, height 6' (1.829 m), weight 116.2 kg, SpO2 98 %.  Medical Problem List and Plan: 1. 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 acute respiratory failure,  cholecystitis, and urosepsis.  -patient may shower  -ELOS/Goals: 10-14 days S  -Admit to CIR 2.  Antithrombotics: -DVT/anticoagulation:  Pharmaceutical: Lovenox (start 40 mg daily)  -antiplatelet therapy: none 3. Pain Management: Has chronic back and shoulder pain. Continue  4. Mood/Behavior/Sleep: LCSW to evaluate and provide emotional support   -antipsychotic agents: n/a  -continue Atarax 10 mg TID prn anxiety 5. Neuropsych/cognition: This patient is not capable of making decisions on his own behalf. 6. Skin/Wound Care: Routine skin care checks  -dysphagia 3/thin liquids; carb modified 7. Fluids/Electrolytes/Nutrition: Routine Is and Os and follow-up chemistries 8: SVT: converted to SR, continue metoprolol 25 mg BID 9: Sepsis sue to GN UTI: he will continue on Augmentin through 11/11 10: Cholecystitis s/p cholecystostomy tube placement; no current indication for cholecystectomy -follow-up with GS as outpatient -routine drain site care and record output daily 11: Elevated LFTs: trending downward; follow-up CMP tomorrow/weekly. 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  Additionally, I have reviewed and concur with the physician assistant's documentation above.  Risa Grill,  PA  Izora Ribas, MD 11/12/2021

## 2021-11-12 NOTE — Progress Notes (Signed)
PT Cancellation Note  Patient Details Name: Troy Randall MRN: [id number] DOB: 1960/02/23   Cancelled Treatment:    Reason Eval/Treat Not Completed: Patient declined, no reason specified Pt declining due to fatigue; ambulated with mobility tech earlier. Plan for d/c to AIR today.  Lillia Pauls, PT, DPT Acute Rehabilitation Services Office [phone]    Norval Morton 11/12/2021, 11:38 AM

## 2021-11-12 NOTE — Progress Notes (Signed)
PMR Admission Coordinator Pre-Admission Assessment   Patient: Troy Randall is an 61 y.o., male MRN: [id number] DOB: 07/01/1960 Height: 6'         Weight: 116.3 kg   Insurance Information HMO:     PPO: yes     PCP:      IPA:      80/20:      OTHER:  PRIMARY: [employer] Medicare      Policy#: [id number]      Subscriber: patient CM Name: Troy Randall      Phone#: [phone]     Fax#: [phone] Pre-Cert#: [id number]    Received authorization from [place] on 11/11/21. Pt approved from 11/11/21-11/17/21. Updates due 11/18/21  Employer:  Benefits:  Phone #: [phone]     Name:  Eff. Date: 01/06/20-still active     Deduct: does not have Out of Pocket Max: $5,900 ($522.70 met)      Life Max: NA CIR: $375/day co-pay with a max co-pay of $1,875/admission      SNF: 100% coverage for days 1-20, $196/day co-pay for days 21-100 Outpatient: $35/visit co-pay     Co-Pay:  Home Health: 80% coverage      Co-Pay: 20% co-insurance DME: 80% coverage     Co-Pay: 20% co-insurance Providers: in-network SECONDARY:       Policy#:      Phone#:    Development worker, community:       Phone#:    The Engineer, petroleum" for patients in Inpatient Rehabilitation Facilities with attached "Privacy Act [place] Records" was provided and verbally reviewed with: Patient and Family   Emergency Contact Information Contact Information       Name Relation Home Work Mobile    Ralston Spouse     [phone]           Current Medical History  Patient Admitting Diagnosis: Debility, Sepsis, SVT   History of Present Illness: Pt is a 61 year old male with medical hx significant for: lower GI bleed, recent diagnosis of acute cholecystitis s/p potscholecystostomy tube (09/07/21), DM II, HTN, hyperlipidemia. Pt was recently admitted to CIR from 10/30/21-11/06/21 after prolonged hospitalization. Pt originally presented to [facility] on 09/04/21 d/t acute lower GI bleed; pt is s/p coil embolization  of jejunal branch of SMA on 09/06/21. 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 Pt was intubated, trached, and underwent treatment that included plasma exchange. Hospitalization complicated by diagnosis of acute cholecystitis; pt is s/p percutaneous cholecystostomy drain (09/07/21). Cholecystostomy tube was exchanged by IR on 10/24/21.In CIR, pt's foley catheter removed and pt was unable to urinate after that time. Pt developed tachycardia with  heart rates in 150s-160s with associated EKG suggestive of SVT. Pt was febrile. Rapid response called d/t SVT. Pt transferred back to acute care of [facility]. Chest x-ray showed no acute cardiopulmonary process. Blood cultures positive for E.coli. Urine culture positive for Klebsiella. Abdominal US with decompressed gallbladder, cholecystectomy tube in place, with no other abnormalities. Pt diagnosed with severe sepsis d/t UTI. On 11/3 foley catheter reinserted d/t persistent urinary retention.Therapy evaluations completed and CIR recommended d/t pt's deficits in functional mobility and inability to complete ADLs independently.   Patient's medical record from [facility] has been reviewed by the rehabilitation admission coordinator and physician.   Past Medical History      Past Medical History:  Diagnosis Date   Anemia     Arthritis     CVA (cerebral  vascular accident) ([place])     Diabetes mellitus without complication ([place])     Hyperlipidemia     Hypertension     Kidney stones        Has the patient had major surgery during 100 days prior to admission? Yes   Family History   family history includes Cancer in an other family member; Diabetes in his father; Heart attack in an other family member; Heart failure in his mother.   Current Medications   Current Facility-Administered Medications:    acetaminophen (TYLENOL) tablet 650 mg, 650 mg, Oral, Q6H PRN, 650 mg at 11/10/21 0620 **OR**  acetaminophen (TYLENOL) suppository 650 mg, 650 mg, Rectal, Q6H PRN, Randall, Troy B, DO, 650 mg at 11/07/21 0850   amoxicillin-clavulanate (AUGMENTIN) 875-125 MG per tablet 1 tablet, 1 tablet, Oral, Q12H, Rosiland Oz, MD, 1 tablet at 11/12/21 0947   Chlorhexidine Gluconate Cloth 2 % PADS 6 each, 6 each, Topical, Daily, Randall, Troy Picket, MD, 6 each at 11/12/21 0946   famotidine (PEPCID) tablet 20 mg, 20 mg, Oral, BID, Randall, Troy B, DO, 20 mg at 11/12/21 0946   hydrOXYzine (ATARAX) tablet 10 mg, 10 mg, Oral, TID PRN, Randall, Troy Picket, MD, 10 mg at 11/08/21 1730   insulin aspart (novoLOG) injection 0-9 Units, 0-9 Units, Subcutaneous, TID WC, Randall, Troy N, DO, 2 Units at 11/11/21 1737   insulin glargine-yfgn (SEMGLEE) injection 12 Units, 12 Units, Subcutaneous, BID, Randall, Troy B, DO, 12 Units at 11/12/21 0947   metoprolol tartrate (LOPRESSOR) tablet 25 mg, 25 mg, Oral, BID, Randall, Troy Picket, MD, 25 mg at 11/12/21 0947   prochlorperazine (COMPAZINE) injection 5 mg, 5 mg, Intravenous, Q6H PRN, Irene Pap N, DO   Patients Current Diet:  Diet Order                  Diet - low sodium heart healthy             DIET DYS 3 Room service appropriate? Yes with Assist; Fluid consistency: Thin  Diet effective now                         Precautions / Restrictions Precautions Precautions: Fall Precaution Comments: R drain Restrictions Weight Bearing Restrictions: No    Has the patient had 2 or more falls or a fall with injury in the past year? No   Prior Activity Level Community (5-7x/wk): drives, gets out of house daily   Prior Functional Level Self Care: Did the patient need help bathing, dressing, using the toilet or eating? Independent   Indoor Mobility: Did the patient need assistance with walking from room to room (with or without device)? Independent   Stairs: Did the patient need assistance with internal or external stairs (with or  without device)? Independent   Functional Cognition: Did the patient need help planning regular tasks such as shopping or remembering to take medications? Independent   Patient Information Are you of Hispanic, Latino/a,or Spanish origin?: A. No, not of Hispanic, Latino/a, or Spanish origin What is your race?: A. White Do you need or want an interpreter to communicate with a doctor or health care staff?: 0. No   Patient's Response To:  Health Literacy and Transportation Is the patient able to respond to health literacy and transportation needs?: Yes Health Literacy - How often do you need to have someone help you when you read instructions, pamphlets, or other written material from your doctor or pharmacy?: Never  In the past 12 months, has lack of transportation kept you from medical appointments or from getting medications?: No In the past 12 months, has lack of transportation kept you from meetings, work, or from getting things needed for daily living?: No   Development worker, international aid / Equipment   Prior Device Use: Indicate devices/aids used by the patient prior to current illness, exacerbation or injury? None of the above   Current Functional Level Cognition   Overall Cognitive Status: Impaired/Different from baseline Current Attention Level: Focused Orientation Level: Oriented to person, Oriented to place Following Commands: Follows one step commands inconsistently, Follows one step commands with increased time Safety/Judgement: Decreased awareness of safety, Decreased awareness of deficits General Comments: pt attempting to follow commands, needing increased time and cues to complete. states "yeah" or "I am" but then does not follow through with instruction at times. Improved sequencing of familiar tasks, poor attention to L side that progressed with fatigue.    Extremity Assessment (includes Sensation/Coordination)   Upper Extremity Assessment: Overall WFL for tasks assessed LUE  Deficits / Details: Pt uses R UE more for adls and is R handed. Pt sat on L hand and did not seem to know it was underneath him.  Lower Extremity Assessment: Defer to PT evaluation     ADLs   Overall ADL's : Needs assistance/impaired Eating/Feeding: Minimal assistance, Sitting Eating/Feeding Details (indicate cue type and reason): Pt used spoon and fork to feed self potatoes and sandwhich during this session sitting on EOB. Walked in to wife feeding pt and spoke to her about letting pt feed self as he is able and this is a great tasks to feel independent with and to work on cognition with. Grooming: Oral care, Wash/dry hands, Minimal assistance, Standing Grooming Details (indicate cue type and reason): Pt stood at sink with min assist to occasional mod assist with posterior lean and R lean a times. Pt required step by step instructions as pt was distrtacted by his walker and other thins in room outside of task at hand. Pt with mild perseveration at end of tasks with cleaning toothbrush and needed one VC to put the brush down. Upper Body Bathing: Moderate assistance Lower Body Bathing: Moderate assistance Upper Body Dressing : Moderate assistance Lower Body Dressing: Maximal assistance Toilet Transfer: Minimal assistance, Rolling walker (2 wheels), Ambulation, Regular Toilet Toilet Transfer Details (indicate cue type and reason): simulated via functional mobility Toileting- Clothing Manipulation and Hygiene: Total assistance Toileting - Clothing Manipulation Details (indicate cue type and reason): catheter Functional mobility during ADLs: Moderate assistance, Rolling walker (2 wheels) General ADL Comments: Pt required mod assist to mobilize today due to running into items on L side in hallway. See PT note.     Mobility   Overal bed mobility: Needs Assistance Bed Mobility: Supine to Sit Supine to sit: Mod assist, HOB elevated Sit to supine: Mod assist General bed mobility comments: modA to  initiate movement of LE, then pt able to elevate trunk from elevated HOB. increased time an d cues.  Pt had difficult time moving up laterally in bed. Unable to follow cues to do so.  Required max assist to scoot up in bed on first trial but once pt knew what we were doing pt moved up with min assist.     Transfers   Overall transfer level: Needs assistance Equipment used: Rolling walker (2 wheels) Transfers: Sit to/from Stand Sit to Stand: Min assist, Min guard Bed to/from chair/wheelchair/BSC transfer type:: Step pivot Step  pivot transfers: Mod assist General transfer comment: minA of 1-2 to rise from EOB, minA-minG to rise to standing from armchair. pt needing increased cues, assist, and assist to manage RW with pivot to return to sitting EOB     Ambulation / Gait / Stairs / Wheelchair Mobility   Ambulation/Gait Ambulation/Gait assistance: Mod assist, +2 safety/equipment, Min assist Gait Distance (Feet): 10 Feet (+ 35 ft + 15 ft + 50 ft) Assistive device: Rolling walker (2 wheels) Gait Pattern/deviations: Decreased stride length, Ataxic, Staggering left, Drifts right/left, Narrow base of support General Gait Details: R knee buckling/hyperextending at times, min-modA to maintain upright. drifts to L and needed assist to avoid obstacles. poor visual scanning Gait velocity: decreased Gait velocity interpretation: <1.31 ft/sec, indicative of household ambulator     Posture / Balance Dynamic Sitting Balance Sitting balance - Comments: prefers UE support Balance Overall balance assessment: Needs assistance Sitting-balance support: Feet supported Sitting balance-Leahy Scale: Fair Sitting balance - Comments: prefers UE support Postural control: Right lateral lean Standing balance support: Bilateral upper extremity supported, During functional activity Standing balance-Leahy Scale: Poor Standing balance comment: Requires UE support in standing. R knee hyperextending and leaning to R with  attempts at unsupported standing     Special needs/care consideration Skin Pressure injury: sacrum/medial; Irritant Dermatitis: rectum; Abrasion: arm/left; Erythema/Redness: buttocks/bilateral; Blister: buttocks/lower; Rash: buttocks/bilateral; Ecchymosis: Abdomen/bilateral; Petechiae: leg/proximal, medial and Diabetic management novoLOG 0-9 units 3x daily at meals; Semglee 12 units 2x daily, Bowel incontinence, Biliary tube, Urethral catheter    Previous Home Environment (from acute therapy documentation) Living Arrangements: Spouse/significant other  Lives With: Spouse Available Help at Discharge: Family, Available 24 hours/day Type of Home: House Home Layout: Able to live on main level with bedroom/bathroom, Laundry or work area in basement Home Access: Stairs to enter Entrance Stairs-Rails: Horticulturist, commercial of Steps: 2 Bathroom Shower/Tub: Multimedia programmer: Handicapped height Bathroom Accessibility: No   Discharge Living Setting Plans for Discharge Living Setting: Patient's home Type of Home at Discharge: House Discharge Home Layout: Able to live on main level with bedroom/bathroom, Laundry or work area in basement Discharge Home Access: Stairs to enter Entrance Stairs-Rails: Horticulturist, commercial of Steps: 2 Discharge Bathroom Shower/Tub: Walk-in shower Discharge Bathroom Toilet: Handicapped height Discharge Bathroom Accessibility: No Does the patient have any problems obtaining your medications?: No   Social/Family/Support Systems Anticipated Caregiver: Angie, wife Anticipated Ambulance person Information: [phone] Caregiver Availability: 24/7 Discharge Plan Discussed with Primary Caregiver: Yes Is Caregiver In Agreement with Plan?: Yes Does Caregiver/Family have Issues with Lodging/Transportation while Pt is in Rehab?: No   Goals Patient/Family Goal for Rehab: Supervision: PT/OT, Mod I:ST Expected length of stay: 12-14  days Pt/Family Agrees to Admission and willing to participate: Yes Program Orientation Provided & Reviewed with Pt/Caregiver Including Roles  & Responsibilities: Yes   Decrease burden of Care through IP rehab admission: NA   Possible need for SNF placement upon discharge: Not anticipated   Patient Condition: I have reviewed medical records from [facility], spoken with CSW, and patient, spouse, and family member. I met with patient at the bedside and discussed via phone for inpatient rehabilitation assessment.  Patient will benefit from ongoing PT, OT, and SLP, can actively participate in 3 hours of therapy a day 5 days of the week, and can make measurable gains during the admission.  Patient will also benefit from the coordinated team approach during an Inpatient Acute Rehabilitation admission.  The patient will receive intensive therapy as well as  Rehabilitation physician, nursing, social worker, and care management interventions.  Due to bladder management, safety, skin/wound care, disease management, medication administration, pain management, and patient education the patient requires 24 hour a day rehabilitation nursing.  The patient is currently Min G-Mod A with mobility and Min-Max A with basic ADLs.  Discharge setting and therapy post discharge at home with home health is anticipated.  Patient has agreed to participate in the Acute Inpatient Rehabilitation Program and will admit today.   Preadmission Screen Completed By:  Bethel Born, 11/12/2021 10:41 AM ______________________________________________________________________   Discussed status with Dr. Ranell Patrick on 11/12/21 at 0930 and received approval for admission today.   Admission Coordinator:  Bethel Born, CCC-SLP, time 10:42 am/Date 11/12/21    Assessment/Plan: Diagnosis: Debility Does the need for close, 24 hr/day Medical supervision in concert with the patient's rehab needs make it unreasonable for this  patient to be served in a less intensive setting? Yes Co-Morbidities requiring supervision/potential complications: sepsis, UTI, obesity, GI bleed, acute cholecystitis Due to bladder management, bowel management, safety, skin/wound care, disease management, medication administration, pain management, and patient education, does the patient require 24 hr/day rehab nursing? Yes Does the patient require coordinated care of a physician, rehab nurse, PT, OT, and SLP to address physical and functional deficits in the context of the above medical diagnosis(es)? Yes Addressing deficits in the following areas: balance, endurance, locomotion, strength, transferring, bowel/bladder control, bathing, dressing, feeding, grooming, toileting, cognition, and psychosocial support Can the patient actively participate in an intensive therapy program of at least 3 hrs of therapy 5 days a week? Yes The potential for patient to make measurable gains while on inpatient rehab is excellent Anticipated functional outcomes upon discharge from inpatient rehab: supervision PT, supervision OT, supervision SLP Estimated rehab length of stay to reach the above functional goals is: 10-14 days Anticipated discharge destination: Home 10. Overall Rehab/Functional Prognosis: excellent     MD Signature: Leeroy Cha, MD         Revision History

## 2021-11-12 NOTE — Progress Notes (Signed)
Pt had a hard time taking Augmentin.  He couldn't take it whole so this LPN crushed it.  Pt still unable to take medication.

## 2021-11-12 NOTE — Discharge Instructions (Addendum)
Inpatient Rehab Discharge Instructions  Troy Randall Discharge date and time:  11/26/2021  Activities/Precautions/ Functional Status: Activity: no lifting, driving, or strenuous exercise until cleared by MD Diet: diabetic diet Wound Care: routine catheter care Functional status:  ___ No restrictions     ___ Walk up steps independently ___ 24/7 supervision/assistance   ___ Walk up steps with assistance __x_ Intermittent supervision/assistance  ___ Bathe/dress independently ___ Walk with walker     ___ Bathe/dress with assistance ___ Walk Independently    ___ Shower independently ___ Walk with assistance    _x__ Shower with assistance _x__ No alcohol     ___ Return to work/school ________  Special Instructions: No driving, alcohol consumption or tobacco use.  No further need to exchange tube, but will need to see general surgeon. Appointment will be entered in case of need to exchange in one month.   COMMUNITY REFERRALS UPON DISCHARGE:    Home Health:   PT     OT     ST                   Agency: [facility]  Phone: [phone]    Medical Equipment/Items Ordered: Wheelchair, [employer], Hospital Bed, Bedside Commode and Shower Chair                                                 Agency/Supplier: [employer] [phone]    My questions have been answered and I understand these instructions. I will adhere to these goals and the provided educational materials after my discharge from the hospital.  Patient/Caregiver Signature _______________________________ Date __________  Clinician Signature _______________________________________ Date __________  Please bring this form and your medication list with you to all your follow-up doctor's appointments.

## 2021-11-12 NOTE — H&P (Incomplete Revision)
Physical Medicine and Rehabilitation Admission H&P  CC: Functional deficits secondary to urosepsis, acute disseminated encephalomyelitis, prolonged hospitalization secondary to GI bleed and acute respiratory failure  HPI: [name] is a 61 year old R handed  male who presented to [facility] emergency department on 09/04/2021 reporting hematochezia associated with diarrhea and abdominal cramping.  He has a past medical history of CVA x2 ( had fully recovered from both- latest 4/23) maintained on Plavix and aspirin.  He had had a normal colonoscopy in February 2023.  Gastroenterology was consulted.  He underwent EGD with deployment of endoscopic capsule and colonoscopy on 9/2.  Interventional radiology was consulted and he underwent coil of the jejunal branch of the SMA on 9/2. Active bleeding noted in the proximal jejunum which was tattooed and hemostatic clip placed.  He underwent flexible sigmoidoscopy on 9/15 to retrieve endoscopic capsule and colonoscopy was performed on 10/3 with findings of large superficial ulceration of the sigmoid colon.  This was biopsied and consistent with chronic colitis.          He required approximately 20 units of packed red blood cells.  His hemoglobin is stable and improving. His hospitalization was complicated by cholecystitis and he underwent percutaneous cholecystostomy tube placement, acute renal failure requiring CRRT and respiratory failure with subsequent tracheostomy 9/11.  LFTs and renal function are normal. CT head performed secondary to acute encephalopathy and history of prior CVA.  MRI of the brain performed secondary to multiple interval white matter insults versus inflammatory process.  Neurology consultation obtained on 9/17.  The patient underwent laboratory work-up including lumbar puncture.  He was treated with IV steroids with mild improvement.  He then underwent full course of plasma exchange completed on 10/11.  MRI completed of the cervical and  thoracic spine.  He will follow-up with neurology as outpatient.   His Foley catheter was removed on 10/9 however he was unable to void spontaneously and his catheter was reinserted 10/10.  He was admitted to rehab 10/30/2021 and noted to have increased frequency of stools and C. difficile toxin checked.  Protonix increased to twice daily dosing.  C. difficile toxin negative.  Follow-up labs stable on 10/30.  Patient was ambulating 95 feet with physical therapy and due to history of GI bleed, Lovenox was discontinued and SCDs applied. Flomax discontinued and doxazosin continued for urinary retention. Complained of low back pain and bilateral shoulder pain and Lidoderm patches were changed to 3 patches from 8 PM to 8 AM and to continue Tylenol.  Continued on dysphagia 3 diet with nectar thick liquids. Voiding trial on 11/2. At approximately 6:30 pm, RN noted rigors and rectal temp of 100. Sepsis work-up initiated. Recheck of vitals signs with heart rate in 160s. Rapid response called. Transferred to acute care/[name] service to monitored bed for SVT and sepsis work-up.  Blood cultures were positive for E. coli.  Intravenous cephalosporins initiated.  CT of the abdomen pelvis without signs of deep infection or collection.  He has remained in sinus rhythm on metoprolol.  Intravenous cefazolin changed to Augmentin which she will continue through 11/11.  Foley catheter remains in place.  Elevated liver function tests trending downward.  Continue drain care.  H&H have remained stable that reports of recurrent GI bleeding.The patient requires inpatient physical medicine and rehabilitation evaluations and treatment secondary to dysfunction due to multiple medical issues including disseminated encephalomyelitis, urosepsis. Patient feels ready to return to CIR today.   Review of Systems  Reason unable to perform  ROS: not oriented.   Past Medical History:  Diagnosis Date   Anemia    Arthritis    CVA (cerebral vascular  accident) (HCC)    Diabetes mellitus without complication (HCC)    Hyperlipidemia    Hypertension    Kidney stones    Past Surgical History:  Procedure Laterality Date   ANKLE SURGERY     BIOPSY  09/10/2021   Procedure: BIOPSY;  Surgeon: Jeani Hawking, MD;  Location: [facility] ENDOSCOPY;  Service: Gastroenterology;;   BIOPSY  10/07/2021   Procedure: BIOPSY;  Surgeon: Jeani Hawking, MD;  Location: [facility] ENDOSCOPY;  Service: Gastroenterology;;   COLONOSCOPY N/A 10/07/2021   Procedure: COLONOSCOPY;  Surgeon: Jeani Hawking, MD;  Location: [facility] ENDOSCOPY;  Service: Gastroenterology;  Laterality: N/A;   COLONOSCOPY WITH PROPOFOL N/A 09/06/2021   Procedure: COLONOSCOPY WITH PROPOFOL;  Surgeon: Dolores Frame, MD;  Location: [facility] ENDO SU[facility];  Service: Gastroenterology;  Laterality: N/A;   ENTEROSCOPY N/A 09/10/2021   Procedure: ENTEROSCOPY;  Surgeon: Jeani Hawking, MD;  Location: [facility] ENDOSCOPY;  Service: Gastroenterology;  Laterality: N/A;   ENTEROSCOPY  09/06/2021   Procedure: ENTEROSCOPY;  Surgeon: Dolores Frame, MD;  Location: [facility] ENDO SUITE;  Service: Gastroenterology;;   ESOPHAGOGASTRODUODENOSCOPY (EGD) WITH PROPOFOL  09/06/2021   Procedure: ESOPHAGOGASTRODUODENOSCOPY (EGD) WITH PROPOFOL;  Surgeon: Dolores Frame, MD;  Location: [facility] ENDO [facility];  Service: Gastroenterology;;   Wenda Low SIGMOIDOSCOPY N/A 09/19/2021   Procedure: Arnell Sieving;  Surgeon: Jeani Hawking, MD;  Location: [facility] ENDOSCOPY;  Service: Gastroenterology;  Laterality: N/A;   FOREIGN BODY REMOVAL  09/19/2021   Procedure: FOREIGN BODY REMOVAL;  Surgeon: Jeani Hawking, MD;  Location: [facility] ENDOSCOPY;  Service: Gastroenterology;;   Emelda Brothers CAPSULE STUDY  09/06/2021   Procedure: GIVENS CAPSULE STUDY;  Surgeon: Dolores Frame, MD;  Location: [facility] END[facility];  Service: Gastroenterology;;   HERNIA REPAIR     umbilical x1 Incisional x1   INCISIONAL HERNIA REPAIR  04/13/2011   Procedure: LAPAROSCOPIC INCISIONAL HERNIA;   Surgeon: Dalia Heading, MD;  Location: [facility] ORS;  Service: General;  Laterality: N/A;  Recurrent Laparoscopic Incisional Herniorraphy with Mesh   IR ANGIOGRAM SELECTIVE EACH ADDITIONAL VESSEL  09/09/2021   IR ANGIOGRAM VISCERAL SELECTIVE  09/07/2021   IR ANGIOGRAM VISCERAL SELECTIVE  09/06/2021   IR ANGIOGRAM VISCERAL SELECTIVE  09/06/2021   IR CHOLANGIOGRAM EXISTING TUBE  11/06/2021   IR EMBO ART  VEN HEMORR LYMPH EXTRAV  INC GUIDE ROADMAPPING  09/06/2021   IR EXCHANGE BILIARY DRAIN  10/24/2021   IR GUIDED DRAIN W CATHETER PLACEMENT  09/07/2021   IR US GUIDE BX ASP/DRAIN  09/07/2021   IR US GUIDE VASC ACCESS RIGHT  09/07/2021   IR US GUIDE VASC ACCESS RIGHT  09/06/2021   KIDNEY STONE SURGERY     Family History  Problem Relation Age of Onset   Heart failure Mother    Diabetes Father    Cancer Other    Heart attack Other    Anesthesia problems Neg Hx    Hypotension Neg Hx    Malignant hyperthermia Neg Hx    Pseudochol deficiency Neg Hx    Colon cancer Neg Hx    Inflammatory bowel disease Neg Hx    Social History:  reports that he has never smoked. His smokeless tobacco use includes snuff. He reports that he does not drink alcohol and does not use drugs. Allergies:  Allergies  Allergen Reactions   Metformin And Related     GI upset   Medications Prior  to Admission  Medication Sig Dispense Refill   amoxicillin-clavulanate (AUGMENTIN) 875-125 MG tablet Take 1 tablet by mouth every 12 (twelve) hours for 4 days. 8 tablet 0   BD VEO INSULIN SYRINGE U/F 31G X 15/64" 1 ML MISC  USE AS DIRECTED 100 each 2   glipiZIDE (GLUCOTROL XL) 10 MG 24 hr tablet Take 1 tablet (10 mg total) by mouth daily with breakfast. 90 tablet 1   glucose blood (ONETOUCH VERIO) test strip TEST twice a day 100 each 3   hydrOXYzine (ATARAX) 10 MG tablet Take 1 tablet (10 mg total) by mouth 3 (three) times daily as needed for anxiety. 30 tablet 0   Insulin Pen Needle (NOVOTWIST) 32G X 5 MM MISC Use two daily to inject Victoza and  Toujeo. 90 each 5   metFORMIN (GLUCOPHAGE-XR) 500 MG 24 hr tablet Take 500-1,000 mg by mouth in the morning and at bedtime.     metoprolol tartrate (LOPRESSOR) 25 MG tablet Take 1 tablet (25 mg total) by mouth 2 (two) times daily.     ONETOUCH DELICA LANCETS 99991111 MISC Use to check blood sugar once a day dx code E11.65 50 each 3   pantoprazole (PROTONIX) 40 MG tablet Take 1 tablet (40 mg total) by mouth 2 (two) times daily before a meal. 30 tablet 1   prochlorperazine (COMPAZINE) 10 MG/2ML injection Inject 1 mL (5 mg total) into the vein every 6 (six) hours as needed. 240 mL    rosuvastatin (CRESTOR) 40 MG tablet Take 1 tablet (40 mg total) by mouth daily. 30 tablet 1   TRESIBA FLEXTOUCH 200 UNIT/ML FlexTouch Pen Inject 20 Units into the skin in the morning and at bedtime.     Expand All Collapse All       Physical Medicine and Rehabilitation Admission H&P   CC: Functional deficits secondary to urosepsis, acute disseminated encephalomyelitis, prolonged hospitalization secondary to GI bleed and acute respiratory failure   HPI: Troy Randall is a 61 year old R handed  male who presented to [facility] emergency department on 09/04/2021 reporting hematochezia associated with diarrhea and abdominal cramping.  He has a past medical history of CVA x2 ( had fully recovered from both- latest 4/23) maintained on Plavix and aspirin.  He had had a normal colonoscopy in February 2023.  Gastroenterology was consulted.  He underwent EGD with deployment of endoscopic capsule and colonoscopy on 9/2.  Interventional radiology was consulted and he underwent coil of the jejunal branch of the SMA on 9/2. Active bleeding noted in the proximal jejunum which was tattooed and hemostatic clip placed.  He underwent flexible sigmoidoscopy on 9/15 to retrieve endoscopic capsule and colonoscopy was performed on 10/3 with findings of large superficial ulceration of the sigmoid colon.  This was biopsied and consistent with chronic  colitis.          He required approximately 20 units of packed red blood cells.  His hemoglobin is stable and improving. His hospitalization was complicated by cholecystitis and he underwent percutaneous cholecystostomy tube placement, acute renal failure requiring CRRT and respiratory failure with subsequent tracheostomy 9/11.  LFTs and renal function are normal. CT head performed secondary to acute encephalopathy and history of prior CVA.  MRI of the brain performed secondary to multiple interval white matter insults versus inflammatory process.  Neurology consultation obtained on 9/17.  The patient underwent laboratory work-up including lumbar puncture.  He was treated with IV steroids with mild improvement.  He then underwent full course of plasma exchange  completed on 10/11.  MRI completed of the cervical and thoracic spine.  He will follow-up with neurology as outpatient.   His Foley catheter was removed on 10/9 however he was unable to void spontaneously and his catheter was reinserted 10/10.   He was admitted to rehab 10/30/2021 and noted to have increased frequency of stools and C. difficile toxin checked.  Protonix increased to twice daily dosing.  C. difficile toxin negative.  Follow-up labs stable on 10/30.  Patient was ambulating 95 feet with physical therapy and due to history of GI bleed, Lovenox was discontinued and SCDs applied. Flomax discontinued and doxazosin continued for urinary retention. Complained of low back pain and bilateral shoulder pain and Lidoderm patches were changed to 3 patches from 8 PM to 8 AM and to continue Tylenol.  Continued on dysphagia 3 diet with nectar thick liquids. Voiding trial on 11/2. At approximately 6:30 pm, RN noted rigors and rectal temp of 100. Sepsis work-up initiated. Recheck of vitals signs with heart rate in 160s. Rapid response called. Transferred to acute care/[name] service to monitored bed for SVT and sepsis work-up.  Blood cultures were positive for  E. coli.  Intravenous cephalosporins initiated.  CT of the abdomen pelvis without signs of deep infection or collection.  He has remained in sinus rhythm on metoprolol.  Intravenous cefazolin changed to Augmentin which she will continue through 11/11.  Foley catheter remains in place.  Elevated liver function tests trending downward.  Continue drain care.  H&H have remained stable that reports of recurrent GI bleeding.The patient requires inpatient physical medicine and rehabilitation evaluations and treatment secondary to dysfunction due to multiple medical issues including disseminated encephalomyelitis, urosepsis.   Review of Systems  Reason unable to perform ROS: not oriented.        Past Medical History:  Diagnosis Date   Anemia     Arthritis     CVA (cerebral vascular accident) (HCC)     Diabetes mellitus without complication (HCC)     Hyperlipidemia     Hypertension     Kidney stones           Past Surgical History:  Procedure Laterality Date   ANKLE SURGERY       BIOPSY   09/10/2021    Procedure: BIOPSY;  Surgeon: Jeani Hawking, MD;  Location: [facility] ENDOSCOPY;  Service: Gastroenterology;;   BIOPSY   10/07/2021    Procedure: BIOPSY;  Surgeon: Jeani Hawking, MD;  Location: [facility] ENDOSCOPY;  Service: Gastroenterology;;   COLONOSCOPY N/A 10/07/2021    Procedure: COLONOSCOPY;  Surgeon: Jeani Hawking, MD;  Location: [facility] ENDOSCOPY;  Service: Gastroenterology;  Laterality: N/A;   COLONOSCOPY WITH PROPOFOL N/A 09/06/2021    Procedure: COLONOSCOPY WITH PROPOFOL;  Surgeon: Dolores Frame, MD;  [facility];  Service: Gastroenterology;  Laterality: N/A;   ENTEROSCOPY N/A 09/10/2021    Procedure: ENTEROSCOPY;  Surgeon: Jeani Hawking, MD;  Location: [facility] ENDOSCOPY;  Service: Gastroenterology;  Laterality: N/A;   ENTEROSCOPY   09/06/2021    Procedure: ENTEROSCOPY;  Surgeon: Dolores Frame, MD;  [facility];  Service: Gastroenterology;;   ESOPHAGOGASTRODUODENOSCOPY  (EGD) WITH PROPOFOL   09/06/2021    Procedure: ESOPHAGOGASTRODUODENOSCOPY (EGD) WITH PROPOFOL;  Surgeon: Dolores Frame, MD;  [facility];  Service: Gastroenterology;;   Wenda Low SIGMOIDOSCOPY N/A 09/19/2021    Procedure: Arnell Sieving;  Surgeon: Jeani Hawking, MD;  Location: [facility] ENDOSCOPY;  Service: Gastroenterology;  Laterality: N/A;   FOREIGN BODY REMOVAL  09/19/2021    Procedure: FOREIGN BODY REMOVAL;  Surgeon: Carol Ada, MD;  Location: [facility] ENDOSCOPY;  Service: Gastroenterology;;   Freda Munro CAPSULE STUDY   09/06/2021    Procedure: GIVENS CAPSULE STUDY;  Surgeon: Harvel Quale, MD;  Location: [facility];  Service: Gastroenterology;;   HERNIA REPAIR        umbilical x1 Incisional x1   INCISIONAL HERNIA REPAIR   04/13/2011    Procedure: LAPAROSCOPIC INCISIONAL HERNIA;  Surgeon: Jamesetta So, MD;  Location: [facility] ORS;  Service: General;  Laterality: N/A;  Recurrent Laparoscopic Incisional Herniorraphy with Mesh   IR ANGIOGRAM SELECTIVE EACH ADDITIONAL VESSEL   09/09/2021   IR ANGIOGRAM VISCERAL SELECTIVE   09/07/2021   IR ANGIOGRAM VISCERAL SELECTIVE   09/06/2021   IR ANGIOGRAM VISCERAL SELECTIVE   09/06/2021   IR CHOLANGIOGRAM EXISTING TUBE   11/06/2021   IR EMBO ART  VEN HEMORR LYMPH EXTRAV  INC GUIDE ROADMAPPING   09/06/2021   IR EXCHANGE BILIARY DRAIN   10/24/2021   IR GUIDED DRAIN W CATHETER PLACEMENT   09/07/2021   IR US GUIDE BX ASP/DRAIN   09/07/2021   IR US GUIDE VASC ACCESS RIGHT   09/07/2021   IR US GUIDE VASC ACCESS RIGHT   09/06/2021   KIDNEY STONE SURGERY             Family History  Problem Relation Age of Onset   Heart failure Mother     Diabetes Father     Cancer Other     Heart attack Other     Anesthesia problems Neg Hx     Hypotension Neg Hx     Malignant hyperthermia Neg Hx     Pseudochol deficiency Neg Hx     Colon cancer Neg Hx     Inflammatory bowel disease Neg Hx      Social History:  reports that he has never smoked. His smokeless  tobacco use includes snuff. He reports that he does not drink alcohol and does not use drugs. Allergies:       Allergies  Allergen Reactions   Metformin And Related        GI upset   Penicillins Hives          Medications Prior to Admission  Medication Sig Dispense Refill   BD VEO INSULIN SYRINGE U/F 31G X 15/64" 1 ML MISC  USE AS DIRECTED 100 each 2   doxazosin (CARDURA) 2 MG tablet Take 1 tablet (2 mg total) by mouth daily.       glipiZIDE (GLUCOTROL XL) 10 MG 24 hr tablet Take 1 tablet (10 mg total) by mouth daily with breakfast. 90 tablet 1   glucose blood (ONETOUCH VERIO) test strip TEST twice a day 100 each 3   Insulin Pen Needle (NOVOTWIST) 32G X 5 MM MISC Use two daily to inject Victoza and Toujeo. 90 each 5   levocetirizine (XYZAL) 5 MG tablet Take 1 tablet by mouth at bedtime.       metFORMIN (GLUCOPHAGE-XR) 500 MG 24 hr tablet Take 500-1,000 mg by mouth in the morning and at bedtime.       ONETOUCH DELICA LANCETS 99991111 MISC Use to check blood sugar once a day dx code E11.65 50 each 3   pantoprazole (PROTONIX) 40 MG tablet Take 1 tablet (40 mg total) by mouth 2 (two) times daily before a meal. 30 tablet 1   rosuvastatin (CRESTOR) 40 MG tablet Take 1 tablet (40 mg total) by mouth  daily. 30 tablet 1   TRESIBA FLEXTOUCH 200 UNIT/ML FlexTouch Pen Inject 20 Units into the skin in the morning and at bedtime.              Home: Home Living Family/patient expects to be discharged to:: Inpatient rehab Living Arrangements: Spouse/significant other Available Help at Discharge: Family, Available 24 hours/day Type of Home: House Home Access: Stairs to enter [employer] of Steps: 2 Entrance Stairs-Rails: Left Home Layout: Able to live on main level with bedroom/bathroom, Laundry or work area in basement [employer] Shower/Tub: Multimedia programmer: Handicapped height Bathroom Accessibility: No  Lives With: Spouse   Functional History: Prior Function Prior Level  of Function : Needs assist Mobility Comments: walking with RW and assist at AIR, ADLs Comments: needing assist at AIR   Functional Status:  Mobility: Bed Mobility Overal bed mobility: Needs Assistance Bed Mobility: Supine to Sit Supine to sit: Mod assist, HOB elevated Sit to supine: Mod assist General bed mobility comments: modA to initiate movement of LE, then pt able to elevate trunk from elevated HOB. increased time an d cues.  Pt had difficult time moving up laterally in bed. Unable to follow cues to do so.  Required max assist to scoot up in bed on first trial but once pt knew what we were doing pt moved up with min assist. Transfers Overall transfer level: Needs assistance Equipment used: Rolling walker (2 wheels) Transfers: Sit to/from Stand Sit to Stand: Min assist, Min guard Bed to/from chair/wheelchair/BSC transfer type:: Step pivot Step pivot transfers: Mod assist General transfer comment: minA of 1-2 to rise from EOB, minA-minG to rise to standing from armchair. pt needing increased cues, assist, and assist to manage RW with pivot to return to sitting EOB Ambulation/Gait Ambulation/Gait assistance: Mod assist, +2 safety/equipment, Min assist Gait Distance (Feet): 10 Feet (+ 35 ft + 15 ft + 50 ft) Assistive device: Rolling walker (2 wheels) Gait Pattern/deviations: Decreased stride length, Ataxic, Staggering left, Drifts right/left, Narrow base of support General Gait Details: R knee buckling/hyperextending at times, min-modA to maintain upright. drifts to L and needed assist to avoid obstacles. poor visual scanning Gait velocity: decreased Gait velocity interpretation: <1.31 ft/sec, indicative of household ambulator   ADL: ADL Overall ADL's : Needs assistance/impaired Eating/Feeding: Minimal assistance, Sitting Eating/Feeding Details (indicate cue type and reason): Pt used spoon and fork to feed self potatoes and sandwhich during this session sitting on EOB. Walked in to  wife feeding pt and spoke to her about letting pt feed self as he is able and this is a great tasks to feel independent with and to work on cognition with. Grooming: Oral care, Wash/dry hands, Minimal assistance, Standing Grooming Details (indicate cue type and reason): Pt stood at sink with min assist to occasional mod assist with posterior lean and R lean a times. Pt required step by step instructions as pt was distrtacted by his walker and other thins in room outside of task at hand. Pt with mild perseveration at end of tasks with cleaning toothbrush and needed one VC to put the brush down. Upper Body Bathing: Moderate assistance Lower Body Bathing: Moderate assistance Upper Body Dressing : Moderate assistance Lower Body Dressing: Maximal assistance Toilet Transfer: Minimal assistance, Rolling walker (2 wheels), Ambulation, Regular Toilet Toilet Transfer Details (indicate cue type and reason): simulated via functional mobility Toileting- Clothing Manipulation and Hygiene: Total assistance Toileting - Clothing Manipulation Details (indicate cue type and reason): catheter Functional mobility during ADLs: Moderate assistance,  Rolling walker (2 wheels) General ADL Comments: Pt required mod assist to mobilize today due to running into items on L side in hallway. See PT note.   Cognition: Cognition Overall Cognitive Status: Impaired/Different from baseline Orientation Level: Oriented to person, Oriented to place Cognition Arousal/Alertness: Awake/alert Behavior During Therapy: Flat affect Overall Cognitive Status: Impaired/Different from baseline Area of Impairment: Attention, Memory, Following commands, Safety/judgement, Awareness, Problem solving Current Attention Level: Focused Memory: Decreased recall of precautions, Decreased short-term memory Following Commands: Follows one step commands inconsistently, Follows one step commands with increased time Safety/Judgement: Decreased awareness  of safety, Decreased awareness of deficits Awareness: Intellectual Problem Solving: Slow processing, Decreased initiation, Difficulty sequencing, Requires verbal cues, Requires tactile cues General Comments: pt attempting to follow commands, needing increased time and cues to complete. states "yeah" or "I am" but then does not follow through with instruction at times. Improved sequencing of familiar tasks, poor attention to L side that progressed with fatigue.      Physical Exam: Blood pressure 131/79, pulse 66, temperature (!) 97.4 F (36.3 C), temperature source Oral, resp. rate 18, height 6' (1.829 m), weight 116.2 kg, SpO2 98 %. Physical Exam Constitutional:      General: He is not in acute distress. HENT:     Head: Normocephalic and atraumatic.  Eyes:     Extraocular Movements: Extraocular movements intact.     Pupils: Pupils are equal, round, and reactive to light.  Cardiovascular:     Rate and Rhythm: Normal rate and regular rhythm.  Abdominal:     General: Bowel sounds are normal. There is no distension.     Palpations: Abdomen is soft.     Comments: Clear bilious drainage in bile bag  Musculoskeletal:     Right lower leg: No edema.     Left lower leg: No edema.  Skin:    General: Skin is warm and dry.  Neurological:     Mental Status: He is alert.     Comments: Oriented to name and birthdate; thinks he is in [place]. 4/5 strength throughout.    Results for orders placed or performed during the hospital encounter of 11/06/21 (from the past 48 hour(s))  Glucose, capillary     Status: Abnormal   Collection Time: 11/10/21  4:08 PM  Result Value Ref Range   Glucose-Capillary 195 (H) 70 - 99 mg/dL    Comment: Glucose reference range applies only to samples taken after fasting for at least 8 hours.  Glucose, capillary     Status: Abnormal   Collection Time: 11/11/21  6:14 AM  Result Value Ref Range   Glucose-Capillary 141 (H) 70 - 99 mg/dL    Comment: Glucose  reference range applies only to samples taken after fasting for at least 8 hours.   Comment 1 Notify RN    Comment 2 Document in Chart   Glucose, capillary     Status: Abnormal   Collection Time: 11/11/21 11:32 AM  Result Value Ref Range   Glucose-Capillary 174 (H) 70 - 99 mg/dL    Comment: Glucose reference range applies only to samples taken after fasting for at least 8 hours.  Glucose, capillary     Status: Abnormal   Collection Time: 11/11/21  5:33 PM  Result Value Ref Range   Glucose-Capillary 155 (H) 70 - 99 mg/dL    Comment: Glucose reference range applies only to samples taken after fasting for at least 8 hours.  Glucose, capillary     Status: Abnormal  Collection Time: 11/11/21  9:55 PM  Result Value Ref Range   Glucose-Capillary 128 (H) 70 - 99 mg/dL    Comment: Glucose reference range applies only to samples taken after fasting for at least 8 hours.   Comment 1 Notify RN    Comment 2 Document in Chart   Glucose, capillary     Status: Abnormal   Collection Time: 11/12/21  6:23 AM  Result Value Ref Range   Glucose-Capillary 101 (H) 70 - 99 mg/dL    Comment: Glucose reference range applies only to samples taken after fasting for at least 8 hours.   Comment 1 Notify RN    Comment 2 Document in Chart   Glucose, capillary     Status: Abnormal   Collection Time: 11/12/21 11:17 AM  Result Value Ref Range   Glucose-Capillary 205 (H) 70 - 99 mg/dL    Comment: Glucose reference range applies only to samples taken after fasting for at least 8 hours.   No results found.    Blood pressure 131/79, pulse 66, temperature (!) 97.4 F (36.3 C), temperature source Oral, resp. rate 18, height 6' (1.829 m), weight 116.2 kg, SpO2 98 %.  Medical Problem List and Plan: 1. 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 acute respiratory failure,  cholecystitis, and urosepsis.  -patient may shower  -ELOS/Goals: 10-14 days S  -Admit to CIR 2.  Antithrombotics: -DVT/anticoagulation:  Pharmaceutical: Lovenox (start 40 mg daily)  -antiplatelet therapy: none 3. Pain Management: Has chronic back and shoulder pain. Continue  4. Mood/Behavior/Sleep: LCSW to evaluate and provide emotional support   -antipsychotic agents: n/a  -continue Atarax 10 mg TID prn anxiety 5. Neuropsych/cognition: This patient is not capable of making decisions on his own behalf. 6. Skin/Wound Care: Routine skin care checks  -dysphagia 3/thin liquids; carb modified 7. Fluids/Electrolytes/Nutrition: Routine Is and Os and follow-up chemistries 8: SVT: converted to SR, continue metoprolol 25 mg BID 9: Sepsis sue to GN UTI: he will continue on Augmentin through 11/11 10: Cholecystitis s/p cholecystostomy tube placement; no current indication for cholecystectomy -follow-up with GS as outpatient -routine drain site care and record output daily 11: Elevated LFTs: trending downward; follow-up CMP tomorrow/weekly. 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  Additionally, I have reviewed and concur with the physician assistant's documentation above.  Risa Grill, PA  Izora Ribas, MD 11/12/2021

## 2021-11-12 NOTE — Progress Notes (Signed)
IP rehab admissions - We have insurance authorization for acute inpatient rehab admission.  I have reached out to attending MD to see if patient is medically ready today.  I do have a bed on CIR available today for this patient.  Call me for questions.  [phone]

## 2021-11-12 NOTE — Discharge Summary (Signed)
Physician Discharge Summary   Patient: Troy Randall MRN: [id number] DOB: May 23, 1960  Admit date:     11/06/2021  Discharge date: 11/12/21  Discharge Physician: [name]   PCP: Troy Squibb, MD   Recommendations at discharge:   Patient to be discharged to inpatient rehab Follow-up general surgery as outpatient for further management for chronic cholecystitis/cholecystostomy tube  Discharge Diagnoses: Principal Problem:   SVT (supraventricular tachycardia) Active Problems:   Sepsis due to gram-negative UTI [facility])   Essential hypertension   History of acute cholecystitis   DM2 (diabetes mellitus, type 2) ([place])   History of lower GI bleeding: 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 [facility] Course: [name] was admitted to the hospital with the working diagnosis of SVT, in the setting of sepsis and gram negative bacteremia.   61 yo male with the past medical history of T2DM, hypertension, and dyslipidemia, who was transferred from inpatient rehab due to the development of SVT.  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 Hospitalization complicated with multiple sclerosis treated with high dose systemic corticosteroids and plasma exchange, he required mechanical ventilation, and ultimately tracheostomy and PEG tube placement. During his hospitalization he was diagnosed with cholecystitis and cholecystectomy tube was placed.  At the inpatient rehab foley catheter was removed on 11/06/21 at 11 am, with no further urine output. He developed tachycardia 150 to 160 bpm prompting his transfer to the hospital.  At the time of his transfer his blood pressure was 118/59, HR 130 to 128, RR 31 and 02 saturation 97%, lungs with no wheezing  or rhonchi, heart with S1 and S2 present and tachycardic, abdomen with no distention, cholecystectomy tube in place, no lower extremity edema.   Na 140, K 3,9 Cl 102 bicarbonate 23, glucose 250 bun 14 cr 1,15  Mag 1.5  ALK p 408, AST 1,227, ALT  535 T Bil 1,7  Lactic acid 3,2 and 2,4  Wbc 14,0 hgb 9,8 plt 215   Urine SG 1,023, 100 protein, > 50 rbc, 0-5 rbc   Blood cultures positive for E coli 2 out 2 bottles   Chest radiograph with no cardiomegaly, hypoinflation but no infiltrates.   EKG 165 bpm, normal axis, normal intervals, SVT rhythm, with no significant ST segment or T wave changes.   Abdominal US with decompressed gallbladder, cholecystectomy tube in place, with no other abnormalities.   Patient was placed on AV blockade and antibiotic therapy  11/03 persistent urinary retention and foley cathter was reinserted.  11/04 Patient clinically improving, follow up CT abdomen and pelvis with no signs of deep infection.  11/05 E coli in blood cultures and Klebsiella in urine cultures, pan sensitive, change antibiotic to cefazolin.  11/06 patient clinically improved and medically stable to be transfer to CIR.  11/07 no current indication for surgical intervention at this time, plan to follow up as outpatient, continue drain care and antibiotic therapy as planned.   Assessment and Plan:   SVT (supraventricular tachycardia) Patient has converted to sinus rhythm Tolerating well metoprolol. Now off telemetry.  Out of bed to chair tid with meals, PT and OT.    Sepsis due to gram-negative UTI (HCC) Severe sepsis present on admission (lactic  acidosis). Gram negative bacteremia.  Urinary retention (present on admission).  Blood culture positive for E coli, urine culture positive for Klebsiella. Sensitive to cephalosporins CT abdomen and pelvis with no signs of deep infection or collection.  Wbc 5,7  (Note that biliary drain was placed on 09/13 and culture was positive for E  coli).  Case discussed with surgery, no current indication for cholecystectomy, continue drain care and close follow up as outpatient. Antibiotic therapy will be continued for at total of 10 days, 11/15/21.  IV Cefazolin was changed to p.o. Augmentin till 11/15/2021.    Urinary retention - patient will need to keep foley cathter until follow up as outpatient with Urology.    Essential hypertension Systolic blood pressure 759 to 140 mmHg.  Plan to continue with metoprolol.  Continue close blood pressure monitoring.   History of  cholecystitis Elevated liver enzymes and hyper bilirubinemia  Imaging with abdominal US with contracted gallbladder. Cholangiography per tube with appropriate positioned and functioning cholecystectomy. No fluoroscopic evidence of cystic duct patency.  Follow up on CT abdomen and pelvis no acute changes or signs of deep infection.   At the time of his discharge liver enzymes continue to improve with AST at 47, ALT 177 and T Bill 0,6.  Continue drain care and close follow up with surgery. No current indication for cholecystectomy.  Dr. Sharmon Leyden and discussed with general surgery, no plan for cholecystectomy at this time  History of lower GI bleeding: 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  No clinical signs of recurrent GI bleeding.   DM2 (diabetes mellitus, type 2) (HCC) Hyperglycemia.  Continue glucose cover and monitoring with insulin sliding scale.  Continue with basal insulin 12 units bid.  Patient is tolerating po well.     Hyperlipidemia Hold on statin therapy until improvement in liver profile.   Hypomagnesemia Hypokalemia Electrolytes have been corrected, renal function continue to be stable. Patient off IV fluids.   Class 2 obesity Calculated BMI 35,9   MS (multiple sclerosis) ([place]) Patient has been recovering well in inpatient rehab Continue with PT and OT Trach and peg have been removed.   Anemia of chronic  disease Hgb has been stable at 9 to 10 Continue close follow up as outpatient.          Consultants: Infectious disease Procedures performed:  Disposition: Rehabilitation facility Diet recommendation:  Discharge Diet Orders (From admission, onward)     Start     Ordered   11/12/21 0000  Diet - low sodium heart healthy        11/12/21 1026           Carb modified diet DISCHARGE MEDICATION: Allergies as of 11/12/2021       Reactions   Metformin And Related    GI upset   Penicillins Hives        Medication List     STOP taking these medications    doxazosin 2 MG tablet Commonly known as: CARDURA   levocetirizine 5 MG tablet Commonly known as: XYZAL       TAKE these medications    amoxicillin-clavulanate 875-125 MG tablet Commonly known as: AUGMENTIN Take 1 tablet by mouth every 12 (twelve) hours for 4 days.   BD Veo Insulin Syringe U/F 31G X 15/64" 1 ML Misc Generic drug: Insulin Syringe-Needle U-100 USE AS DIRECTED   glipiZIDE 10 MG 24 hr tablet Commonly known as: Glucotrol XL Take 1 tablet (10 mg total) by mouth daily  with breakfast.   glucose blood test strip Commonly known as: OneTouch Verio TEST twice a day   hydrOXYzine 10 MG tablet Commonly known as: ATARAX Take 1 tablet (10 mg total) by mouth 3 (three) times daily as needed for anxiety.   Insulin Pen Needle 32G X 5 MM Misc Commonly known as: NovoTwist Use two daily to inject Victoza and Toujeo.   metFORMIN 500 MG 24 hr tablet Commonly known as: GLUCOPHAGE-XR Take 500-1,000 mg by mouth in the morning and at bedtime.   metoprolol tartrate 25 MG tablet Commonly known as: LOPRESSOR Take 1 tablet (25 mg total) by mouth 2 (two) times daily.   OneTouch Delica Lancets 99H Misc Use to check blood sugar once a day dx code E11.65   pantoprazole 40 MG tablet Commonly known as: Protonix Take 1 tablet (40 mg total) by mouth 2 (two) times daily before a meal.   prochlorperazine 10  MG/2ML injection Commonly known as: COMPAZINE Inject 1 mL (5 mg total) into the vein every 6 (six) hours as needed.   rosuvastatin 40 MG tablet Commonly known as: Crestor Take 1 tablet (40 mg total) by mouth daily.   Tyler Aas FlexTouch 200 UNIT/ML FlexTouch Pen Generic drug: insulin degludec Inject 20 Units into the skin in the morning and at bedtime.        Follow-up Information     Erroll Luna, MD Follow up on 12/12/2021.   Specialty: General Surgery Why: 10am. Please arrive 30 minutes prior to your presentation for paperwork. Please bring a copy of your photo ID and insurance card. Contact information: [address] [place] [address] [phone]                Discharge Exam: Danley Danker Weights   11/10/21 0352 11/11/21 0504 11/12/21 0458  Weight: 117.2 kg 117 kg 116.3 kg   General-appears in no acute distress Heart-S1-S2, regular, no murmur auscultated Lungs-clear to auscultation bilaterally, no wheezing or crackles auscultated Abdomen-soft, nontender, no organomegaly Extremities-no edema in the lower extremities Neuro-alert, oriented x3, no focal deficit noted  Condition at discharge: good  The results of significant diagnostics from this hospitalization (including imaging, microbiology, ancillary and laboratory) are listed below for reference.   Imaging Studies: CT ABDOMEN PELVIS W CONTRAST  Result Date: 11/08/2021 CLINICAL DATA:  Sepsis. EXAM: CT ABDOMEN AND PELVIS WITH CONTRAST TECHNIQUE: Multidetector CT imaging of the abdomen and pelvis was performed using the standard protocol following bolus administration of intravenous contrast. RADIATION DOSE REDUCTION: This exam was performed according to the departmental dose-optimization program which includes automated exposure control, adjustment of the mA and/or kV according to patient size and/or use of iterative reconstruction technique. CONTRAST:  164m OMNIPAQUE IOHEXOL 350 MG/ML SOLN  COMPARISON:  Ultrasound 11/07/2021.  CT 10/16/2021, CTA 09/10/2021 FINDINGS: Lower chest: Trace bilateral pleural effusions. No focal airspace disease. Hepatobiliary: Cholecystostomy tube decompresses the gallbladder. There is no intra or extrahepatic biliary ductal dilatation. No intrahepatic fluid collection or focal lesion. Pancreas: No ductal dilatation or inflammation. Spleen: Normal in size without focal abnormality. Adrenals/Urinary Tract: Normal adrenal glands. Punctate nonobstructing stone in the upper pole and mid left kidney. Punctate nonobstructing stones in the mid right kidney. No hydronephrosis. Homogeneous enhancement with symmetric excretion on delayed phase imaging. 2 cm low-density lesion in the mid right kidney measures simple fluid density and is likely cyst. No imaging follow-up is recommended. No suspicious renal lesion. Urinary bladder is decompressed by Foley catheter. Stomach/Bowel: Normal appearance of the stomach. There are  2 duodenal diverticulum without inflammation. No small bowel obstruction or inflammatory change. Normal appendix. Colonic diverticulosis without focal diverticulitis. No colonic inflammation. Vascular/Lymphatic: Normal caliber abdominal aorta. Retroaortic left renal vein. Patent portal and splenic veins. No enlarged lymph nodes in the abdomen or pelvis. Reproductive: Prostate is unremarkable. Other: Prior ventral abdominal wall hernia repair with mesh. There is patchy soft tissue density in the anterior omental fat that may represent omental infarct. This was not previously included in the abdominal field of view. Trace perihepatic fluid. No free intra-abdominal air. There are scattered surgical clips or coils in the small bowel mesentery. Musculoskeletal: Lumbar degenerative change. Bilateral hip osteoarthritis. There are no acute or suspicious osseous abnormalities. No intramuscular collection. IMPRESSION: 1. Cholecystostomy tube decompresses the gallbladder. No  intra or extrahepatic biliary ductal dilatation. 2. Patchy soft tissue density in the anterior omental fat may represent omental infarct. 3. Colonic diverticulosis without focal diverticulitis. 4. Bilateral nonobstructing nephrolithiasis. 5. Trace bilateral pleural effusions. Electronically Signed   By: Keith Rake M.D.   On: 11/08/2021 17:34   US ABDOMEN LIMITED RUQ (LIVER/GB)  Result Date: 11/07/2021 CLINICAL DATA:  Acute cholecystitis. EXAM: ULTRASOUND ABDOMEN LIMITED RIGHT UPPER QUADRANT COMPARISON:  November 06, 2021. FINDINGS: Gallbladder: Presumably decompressed and not well visualized due to presence of drainage catheter. No abnormal wall thickness is noted. No sonographic Murphy's sign is noted. Common bile duct: Diameter: 5 mm which is within normal limits. Liver: No focal lesion identified. Within normal limits in parenchymal echogenicity. Portal vein is patent on color Doppler imaging with normal direction of blood flow towards the liver. Other: None. IMPRESSION: Patient apparently has percutaneous cholecystostomy in place. The gallbladder is not well visualized and presumably is decompressed secondary to the catheter. No other definite abnormality seen in the right upper quadrant of the abdomen. Electronically Signed   By: Marijo Conception M.D.   On: 11/07/2021 15:20   ECHOCARDIOGRAM LIMITED  Result Date: 11/07/2021    ECHOCARDIOGRAM LIMITED REPORT   Patient Name:   TAI SKELLY Date of Exam: 11/07/2021 Medical Rec #:  [id number]       Height:       72.0 in Accession #:    [id number]      Weight:       264.8 lb Date of Birth:  1960-10-17       BSA:          2.400 m Patient Age:    61 years        BP:           131/76 mmHg Patient Gender: M               HR:           102 bpm. Exam Location:  Inpatient Procedure: 2D Echo, Cardiac Doppler and Color Doppler Indications:    atrial fibrillation  History:        Patient has prior history of Echocardiogram examinations, most                 recent  09/09/2021. Risk Factors:Diabetes, Hypertension and                 Dyslipidemia.  Sonographer:    Johny Chess RDCS Referring Phys: [id number] Rhetta Mura  Sonographer Comments: Image acquisition challenging due to uncooperative patient and Image acquisition challenging due to patient body habitus. IMPRESSIONS  1. Left ventricular ejection fraction, by estimation, is 70 to 75%. The left ventricle has hyperdynamic function. The  left ventricle has no regional wall motion abnormalities. There is moderate concentric left ventricular hypertrophy.  2. Right ventricular systolic function is normal. The right ventricular size is normal.  3. The mitral valve is normal in structure. Trivial mitral valve regurgitation. No evidence of mitral stenosis.  4. The aortic valve is tricuspid. Aortic valve regurgitation is mild. Aortic valve sclerosis/calcification is present, without any evidence of aortic stenosis.  5. Aortic dilatation noted. There is mild dilatation of the ascending aorta, measuring 42 mm. FINDINGS  Left Ventricle: Left ventricular ejection fraction, by estimation, is 70 to 75%. The left ventricle has hyperdynamic function. The left ventricle has no regional wall motion abnormalities. The left ventricular internal cavity size was normal in size. There is moderate concentric left ventricular hypertrophy. Right Ventricle: The right ventricular size is normal. Right ventricular systolic function is normal. Left Atrium: Left atrial size was normal in size. Right Atrium: Right atrial size was normal in size. Pericardium: There is no evidence of pericardial effusion. Mitral Valve: The mitral valve is normal in structure. Trivial mitral valve regurgitation. No evidence of mitral valve stenosis. Tricuspid Valve: The tricuspid valve is normal in structure. Tricuspid valve regurgitation is not demonstrated. No evidence of tricuspid stenosis. Aortic Valve: The aortic valve is tricuspid. Aortic valve regurgitation is  mild. Aortic valve sclerosis/calcification is present, without any evidence of aortic stenosis. Pulmonic Valve: The pulmonic valve was normal in structure. Pulmonic valve regurgitation is not visualized. No evidence of pulmonic stenosis. Aorta: Aortic dilatation noted. There is mild dilatation of the ascending aorta, measuring 42 mm. Venous: The inferior vena cava was not well visualized. IAS/Shunts: The interatrial septum was not well visualized. LEFT VENTRICLE PLAX 2D LVIDd:         4.40 cm LVIDs:         2.60 cm LV PW:         1.40 cm LV IVS:        1.30 cm LVOT diam:     2.20 cm LV SV:         64 LV SV Index:   27 LVOT Area:     3.80 cm  LEFT ATRIUM         Index LA diam:    3.90 cm 1.62 cm/m  AORTIC VALVE LVOT Vmax:   107.00 cm/s LVOT Vmean:  72.000 cm/s LVOT VTI:    0.169 m  AORTA Ao Asc diam: 4.20 cm MV E velocity: 71.20 cm/s MV A velocity: 85.20 cm/s  SHUNTS MV E/A ratio:  0.84        Systemic VTI:  0.17 m                            Systemic Diam: 2.20 cm Kirk Ruths MD Electronically signed by Kirk Ruths MD Signature Date/Time: 11/07/2021/12:42:32 PM    Final    DG Chest Port 1 View  Result Date: 11/06/2021 CLINICAL DATA:  Fever EXAM: PORTABLE CHEST 1 VIEW COMPARISON:  Chest x-ray 10/05/2021 FINDINGS: The heart size and mediastinal contours are within normal limits. Both lungs are clear. The visualized skeletal structures are unremarkable. IMPRESSION: No active disease. Electronically Signed   By: Ronney Asters M.D.   On: 11/06/2021 20:11   IR CHOLANGIOGRAM EXISTING TUBE  Result Date: 11/06/2021 INDICATION: History of acute cholecystitis, post ultrasound fluoroscopic guided cholecystostomy tube placement in 09/08/2021. Most recent cholecystostomy exchange in 10/24/2021. EXAM: CHOLECYSTOSTOMY TUBE INJECTION COMPARISON:  IR fluoroscopy, 09/08/2021 and  10/24/2021. CT AP, 10/16/2021. MEDICATIONS: None ANESTHESIA/SEDATION: None CONTRAST:  5 mL Omnipaque 300-administered into the gallbladder  fossa. FLUOROSCOPY TIME:  Fluoroscopic dose; 4 mGy COMPLICATIONS: None immediate. PROCEDURE: The patient was positioned supine on the fluoroscopy table. A preprocedural spot fluoroscopic image was obtained of the RIGHT upper abdominal quadrant existing cholecystostomy tube. Multiple spot fluoroscopic radiographic images were obtained of the RIGHT upper abdominal quadrant an existing cholecystostomy tube following injection of a small amount of contrast. Images were reviewed and discussed with the patient. The cholecystostomy tube was flushed with a small amount of saline and capped. A dressing was placed. The patient tolerated the procedure well without immediate postprocedural complication. FINDINGS: Preprocedural spot fluoroscopic image of the right upper abdominal quadrant demonstrates grossly unchanged positioning of the cholecystostomy tube with end coiled and locked overlying the expected location of the gallbladder fundus. Subsequent contrast injection demonstrates appropriate functionality of the cholecystostomy tube with brisk opacification of the gallbladder. Persistent occlusion of the cystic duct, without passage of contrast from the gallbladder and into the common bile duct. IMPRESSION: 1. Appropriately positioned and functioning cholecystostomy. No cholecystostomy drain exchange was performed. 2. No fluoroscopic evidence of cystic duct patency. Attention on follow-up. PLAN: The patient will return to Vascular Interventional Radiology (VIR) for routine drainage catheter evaluation and exchange in 8 weeks from most recent exchange. Michaelle Birks, MD [facility] Radiology Electronically Signed   By: Michaelle Birks M.D.   On: 11/06/2021 17:16   IR EXCHANGE BILIARY DRAIN  Result Date: 10/24/2021 INDICATION: History of acute cholecystitis, post ultrasound fluoroscopic guided cholecystostomy tube placement on 09/08/2018. EXAM: FLUOROSCOPIC GUIDED  CHOLECYSTOSTOMY TUBE EXCHANGE COMPARISON:  09/07/2021, 10/16/2021 MEDICATIONS: None ANESTHESIA/SEDATION: None CONTRAST:  5 mL Omnipaque 300-administered into the gallbladder lumen. FLUOROSCOPY TIME:  Forty-eight seconds, 11 mGy COMPLICATIONS: None immediate. PROCEDURE: The patient was positioned supine on the fluoroscopy table. The external portion of the existing cholecystostomy tube as well as the surrounding skin was prepped and draped in usual sterile fashion. A time-out was performed prior to the initiation of the procedure. A preprocedural spot fluoroscopic image was obtained of the right upper abdominal quadrant existing cholecystostomy tube. The skin surrounding the cholecystostomy tube was anesthetized with 1% lidocaine with epinephrine. The external portion of the cholecystostomy tube was cut and cannulated with a short Amplatz wire which was advanced through the tube and coiled within the gallbladder lumen Next, under intermittent fluoroscopic guidance, the existing 10 French cholecystostomy tube was exchanged for a new 10 [place] cholecystostomy tube which was repositioned into the more central aspect of the gallbladder lumen. Contrast injection confirms appropriate positioning and functionality of the cholecystomy tube. The cholecystostomy tube was flushed with a small amount of saline and reconnected to a gravity bag. The cholecystostomy tube was secured with an interrupted suture and a Stat Lock device. A dressing was applied. The patient tolerated the procedure well without immediate postprocedural complication. FINDINGS: Preprocedural spot fluoroscopic image demonstrates unchanged positioning of cholecystostomy tube with end coiled and locked over the expected location of the fundus of the gallbladder After fluoroscopic guided exchange, the new 10 Fr cholecystostomy tube is positioned with end coiled and locked within the central aspect of the gallbladder lumen. Post exchange cholangiogram  demonstrates appropriate positioning and functionality of the new cholecystostomy tube. IMPRESSION: Successful fluoroscopic guided exchange of 10.2 Fr French cholecystostomy tube. PLAN: - The patient's cholecystostomy tube was reconnected to a gravity bag. - The patient may return to the interventional radiology drain clinic  in 8 weeks for repeat diagnostic cholangiogram/exchange. Ruthann Cancer, MD [name] Radiology Electronically Signed   By: Ruthann Cancer M.D.   On: 10/24/2021 10:08   DG Abd Portable 1V  Result Date: 10/20/2021 CLINICAL DATA:  Feeding tube placement EXAM: PORTABLE ABDOMEN - 1 VIEW COMPARISON:  CT 4 days ago. FINDINGS: Soft feeding tube enters the stomach, loops in the fundus and has its tip in the region of the antrum. IMPRESSION: Soft feeding tube tip in the region of the antrum of the stomach. Electronically Signed   By: Nelson Chimes M.D.   On: 10/20/2021 12:29   CT ABDOMEN WO CONTRAST  Result Date: 10/18/2021 CLINICAL DATA:  Assessment of anatomy for possible gastrostomy tube placement. EXAM: CT ABDOMEN WITHOUT CONTRAST TECHNIQUE: Multidetector CT imaging of the abdomen was performed following the standard protocol without IV contrast. RADIATION DOSE REDUCTION: This exam was performed according to the departmental dose-optimization program which includes automated exposure control, adjustment of the mA and/or kV according to patient size and/or use of iterative reconstruction technique. COMPARISON:  CTA of the abdomen on 09/10/2021 FINDINGS: Lower chest: No acute abnormality. Hepatobiliary: Unremarkable unenhanced appearance of the liver. The gallbladder is decompressed and contains a cholecystostomy tube. Pancreas: Unremarkable. No pancreatic ductal dilatation or surrounding inflammatory changes. Spleen: Normal in size without focal abnormality. Adrenals/Urinary Tract: Adrenal glands and kidneys are unremarkable by unenhanced CT.  Stomach/Bowel: Feeding tube extends into the stomach and terminates in the descending duodenum. No evidence of hiatal hernia. Gastric anatomy appears normal. The splenic flexure of the colon abuts the lateral aspect of the stomach but is not interposed between the stomach and anterior abdominal wall directly. No evidence bowel obstruction, ileus or free intraperitoneal air. No bowel lesions or inflammation identified. Vascular/Lymphatic: No significant vascular findings are present. No enlarged abdominal lymph nodes. Other: No ascites or focal fluid collections in the abdomen. Midline abdominal wall hernia mesh is visualized beginning above the umbilicus and extending into the pelvis. Hernia mesh appears to be located well inferior to anticipated path of gastrostomy tube access. Musculoskeletal: No acute or significant osseous findings. IMPRESSION: 1. Gastric anatomy appears normal. The splenic flexure of the colon abuts the lateral aspect of the stomach but is not interposed between the stomach and anterior abdominal wall directly. No anatomic contraindication to possible attempt at percutaneous gastrostomy tube placement. 2. Abdominal wall hernia mesh appears to be located well inferior to anticipated path of gastrostomy tube access. 3. Cholecystostomy tube in appropriate position within decompressed gallbladder. Electronically Signed   By: Aletta Edouard M.D.   On: 10/18/2021 12:19    Microbiology: Results for orders placed or performed during the hospital encounter of 10/30/21  C Difficile Quick Screen w PCR reflex     Status: None   Collection Time: 10/31/21  6:11 PM   Specimen: STOOL  Result Value Ref Range Status   C Diff antigen NEGATIVE NEGATIVE Final   C Diff toxin NEGATIVE NEGATIVE Final   C Diff interpretation No C. difficile detected.  Final    Comment: Performed at [facility], [place] [address], [place], [place] [address]  MRSA Next Gen by PCR, Nasal     Status: Abnormal   Collection  Time: 11/02/21  8:14 AM   Specimen: Nasal Mucosa; Nasal Swab  Result Value Ref Range Status   MRSA by PCR Next Gen DETECTED (A) NOT DETECTED Final    Comment: RESULT CALLED TO, READ BACK BY AND VERIFIED WITH:  11/02/2021 AT [place], ADC (NOTE) The GeneXpert MRSA Assay (FDA approved for NASAL specimens only), is one component of a comprehensive MRSA colonization surveillance program. It is not intended to diagnose MRSA infection nor to guide or monitor treatment for MRSA infections. Test performance is not FDA approved in patients less than 33 years old. Performed at [facility], [place] [address], [place], [place] [address]   MRSA Next Gen by PCR, Nasal     Status: Abnormal   Collection Time: 11/03/21 10:52 AM   Specimen: Nasal Mucosa; Nasal Swab  Result Value Ref Range Status   MRSA by PCR Next Gen DETECTED (A) NOT DETECTED Final    Comment: RESULT CALLED TO, READ BACK BY AND VERIFIED WITH: RN E.HUNT AT 3474 ON 11/03/2021 BY T.SAAD. (NOTE) The GeneXpert MRSA Assay (FDA approved for NASAL specimens only), is one component of a comprehensive MRSA colonization surveillance program. It is not intended to diagnose MRSA infection nor to guide or monitor treatment for MRSA infections. Test performance is not FDA approved in patients less than 49 years old. Performed at [facility], [place] [address], [place], [place] [address]   Urine Culture     Status: Abnormal   Collection Time: 11/06/21  8:19 PM   Specimen: In/Out Cath Urine  Result Value Ref Range Status   Specimen Description IN/OUT CATH URINE  Final   Special Requests   Final    NONE Performed at [facility], [place] [address], [place], [place] [address]    Culture >=100,000 COLONIES/mL KLEBSIELLA PNEUMONIAE (A)  Final   Report Status 11/08/2021 FINAL  Final   Organism ID, Bacteria KLEBSIELLA PNEUMONIAE (A)  Final      Susceptibility   Klebsiella pneumoniae - MIC*    AMPICILLIN >=32 RESISTANT  Resistant     CEFAZOLIN <=4 SENSITIVE Sensitive     CEFEPIME <=0.12 SENSITIVE Sensitive     CEFTRIAXONE <=0.25 SENSITIVE Sensitive     CIPROFLOXACIN <=0.25 SENSITIVE Sensitive     GENTAMICIN <=1 SENSITIVE Sensitive     IMIPENEM <=0.25 SENSITIVE Sensitive     NITROFURANTOIN 64 INTERMEDIATE Intermediate     TRIMETH/SULFA <=20 SENSITIVE Sensitive     AMPICILLIN/SULBACTAM 8 SENSITIVE Sensitive     PIP/TAZO <=4 SENSITIVE Sensitive     * >=100,000 COLONIES/mL KLEBSIELLA PNEUMONIAE  Culture, blood (Routine X 2) w Reflex to ID Panel     Status: Abnormal   Collection Time: 11/06/21  8:36 PM   Specimen: BLOOD  Result Value Ref Range Status   Specimen Description BLOOD LEFT ANTECUBITAL  Final   Special Requests   Final    BOTTLES DRAWN AEROBIC AND ANAEROBIC Blood Culture adequate volume   Culture  Setup Time   Final    GRAM NEGATIVE RODS IN BOTH AEROBIC AND ANAEROBIC BOTTLES CRITICAL VALUE NOTED.  VALUE IS CONSISTENT WITH PREVIOUSLY REPORTED AND CALLED VALUE.    Culture (A)  Final    ESCHERICHIA COLI SUSCEPTIBILITIES PERFORMED ON PREVIOUS CULTURE WITHIN THE LAST 5 DAYS. Performed at [facility], [place] [address] [place], [place] [address]    Report Status 11/09/2021 FINAL  Final  Culture, blood (Routine X 2) w Reflex to ID Panel     Status: Abnormal   Collection Time: 11/06/21  8:36 PM   Specimen: BLOOD LEFT ARM  Result Value Ref Range Status   Specimen Description BLOOD LEFT ARM  Final   Special Requests   Final  BOTTLES DRAWN AEROBIC AND ANAEROBIC Blood Culture adequate volume   Culture  Setup Time   Final    GRAM NEGATIVE RODS IN BOTH AEROBIC AND ANAEROBIC BOTTLES CRITICAL RESULT CALLED TO, READ BACK BY AND VERIFIED WITH: Susy Manor [phone] AT 0954 BY EC Performed at [facility], [place] [address] [place], [place] [address]    Culture ESCHERICHIA COLI (A)  Final   Report Status 11/09/2021 FINAL  Final   Organism ID, Bacteria ESCHERICHIA COLI  Final       Susceptibility   Escherichia coli - MIC*    AMPICILLIN 4 SENSITIVE Sensitive     CEFAZOLIN <=4 SENSITIVE Sensitive     CEFEPIME <=0.12 SENSITIVE Sensitive     CEFTAZIDIME <=1 SENSITIVE Sensitive     CEFTRIAXONE <=0.25 SENSITIVE Sensitive     CIPROFLOXACIN <=0.25 SENSITIVE Sensitive     GENTAMICIN <=1 SENSITIVE Sensitive     IMIPENEM <=0.25 SENSITIVE Sensitive     TRIMETH/SULFA <=20 SENSITIVE Sensitive     AMPICILLIN/SULBACTAM <=2 SENSITIVE Sensitive     PIP/TAZO <=4 SENSITIVE Sensitive     * ESCHERICHIA COLI  Blood Culture ID Panel (Reflexed)     Status: Abnormal   Collection Time: 11/06/21  8:36 PM  Result Value Ref Range Status   Enterococcus faecalis NOT DETECTED NOT DETECTED Final   Enterococcus Faecium NOT DETECTED NOT DETECTED Final   Listeria monocytogenes NOT DETECTED NOT DETECTED Final   Staphylococcus species NOT DETECTED NOT DETECTED Final   Staphylococcus aureus (BCID) NOT DETECTED NOT DETECTED Final   Staphylococcus epidermidis NOT DETECTED NOT DETECTED Final   Staphylococcus lugdunensis NOT DETECTED NOT DETECTED Final   Streptococcus species NOT DETECTED NOT DETECTED Final   Streptococcus agalactiae NOT DETECTED NOT DETECTED Final   Streptococcus pneumoniae NOT DETECTED NOT DETECTED Final   Streptococcus pyogenes NOT DETECTED NOT DETECTED Final   A.calcoaceticus-baumannii NOT DETECTED NOT DETECTED Final   Bacteroides fragilis NOT DETECTED NOT DETECTED Final   Enterobacterales DETECTED (A) NOT DETECTED Final    Comment: Enterobacterales represent a large order of gram negative bacteria, not a single organism. CRITICAL RESULT CALLED TO, READ BACK BY AND VERIFIED WITH: PHARMD [name] FRENS [id number] AT [id number] BY EC    Enterobacter cloacae complex NOT DETECTED NOT DETECTED Final   Escherichia coli DETECTED (A) NOT DETECTED Final    Comment: CRITICAL RESULT CALLED TO, READ BACK BY AND VERIFIED WITH: PHARMD [name] FRENS [id number] AT 0954 BY EC    Klebsiella aerogenes NOT  DETECTED NOT DETECTED Final   Klebsiella oxytoca NOT DETECTED NOT DETECTED Final   Klebsiella pneumoniae NOT DETECTED NOT DETECTED Final   Proteus species NOT DETECTED NOT DETECTED Final   Salmonella species NOT DETECTED NOT DETECTED Final   Serratia marcescens NOT DETECTED NOT DETECTED Final   Haemophilus influenzae NOT DETECTED NOT DETECTED Final   Neisseria meningitidis NOT DETECTED NOT DETECTED Final   Pseudomonas aeruginosa NOT DETECTED NOT DETECTED Final   Stenotrophomonas maltophilia NOT DETECTED NOT DETECTED Final   Candida albicans NOT DETECTED NOT DETECTED Final   Candida auris NOT DETECTED NOT DETECTED Final   Candida glabrata NOT DETECTED NOT DETECTED Final   Candida krusei NOT DETECTED NOT DETECTED Final   Candida parapsilosis NOT DETECTED NOT DETECTED Final   Candida tropicalis NOT DETECTED NOT DETECTED Final   Cryptococcus neoformans/gattii NOT DETECTED NOT DETECTED Final   CTX-M ESBL NOT DETECTED NOT DETECTED Final   Carbapenem resistance IMP NOT DETECTED NOT DETECTED Final   Carbapenem resistance  KPC NOT DETECTED NOT DETECTED Final   Carbapenem resistance NDM NOT DETECTED NOT DETECTED Final   Carbapenem resist OXA 48 LIKE NOT DETECTED NOT DETECTED Final   Carbapenem resistance VIM NOT DETECTED NOT DETECTED Final    Comment: Performed at [facility], [place] [address], [place], [place] [address]    Labs: CBC: Recent Labs  Lab 11/06/21 2032 11/07/21 0039 11/08/21 1010 11/09/21 0034 11/10/21 0027  WBC 14.0* 16.4* 6.2 5.0 5.7  NEUTROABS 13.3* 15.4*  --   --   --   HGB 9.8* 9.7* 9.5* 9.6* 10.7*  HCT 31.5* 30.9* 31.6* 31.5* 34.8*  MCV 88.5 89.3 90.5 88.0 88.8  PLT 215 201 211 213 111   Basic Metabolic Panel: Recent Labs  Lab 11/06/21 2032 11/07/21 0039 11/08/21 0047 11/09/21 0034 11/10/21 0027  NA 140 137 138 138 138  K 3.9 3.4* 4.0 4.0 3.8  CL 102 104 108 103 105  CO2 _0 GLUCOSE 250* 206* 151* 131* 148*  BUN _1 CREATININE  1.15 1.03 0.69 0.62 0.68  CALCIUM 9.2 8.6* 8.6* 8.8* 9.0  MG 1.5* 2.1  --   --   --    Liver Function Tests: Recent Labs  Lab 11/06/21 2032 11/07/21 0039 11/08/21 1010 11/09/21 0034 11/10/21 0027  AST 1,227* 1,150* 259* 130* 47*  ALT 535* 596* 337* 266* 177*  ALKPHOS 408* 426* 330* 321* 282*  BILITOT 1.7* 1.8* 1.0 0.8 0.6  PROT 6.1* 5.9* 5.9* 5.8* 6.1*  ALBUMIN 3.0* 2.9* 2.6* 2.6* 2.9*   CBG: Recent Labs  Lab 11/11/21 0614 11/11/21 1132 11/11/21 1733 11/11/21 2155 11/12/21 0623  GLUCAP 141* 174* 155* 128* 101*    Discharge time spent: greater than 30 minutes.  Signed: [name], MD [facility] 11/12/2021

## 2021-11-12 NOTE — Plan of Care (Signed)

## 2021-11-12 NOTE — Progress Notes (Signed)
Inpatient Rehabilitation Admission Medication Review by a Pharmacist  A complete drug regimen review was completed for this patient to identify any potential clinically significant medication issues.  High Risk Drug Classes Is patient taking? Indication by Medication  Antipsychotic Yes Compazine prn N/V  Anticoagulant Yes Lovenox - VTE ppx  Antibiotic Yes PO Augmentin - UTI  Opioid No   Antiplatelet No   Hypoglycemics/insulin Yes SSI, Lantus - DM  Vasoactive Medication Yes Metoprolol - SVT, BP  Chemotherapy No   Other Yes Trazodone prn sleep Famotidine - Reflux Hydroxyzine - prn anxiety     Type of Medication Issue Identified Description of Issue Recommendation(s)  Drug Interaction(s) (clinically significant)     Duplicate Therapy     Allergy     No Medication Administration End Date     Incorrect Dose     Additional Drug Therapy Needed     Significant med changes from prior encounter (inform family/care partners about these prior to discharge). [name] / Metformin / glipizide for DM Crestor 40 on hold for elevated liver enzymes Doxazosin stopped Address at discharge from rehab  Other       Clinically significant medication issues were identified that warrant physician communication and completion of prescribed/recommended actions by midnight of the next day:  No  Pharmacist comments: None  Time spent performing this drug regimen review (minutes):  20 minutes  Thank you Okey Regal, PharmD

## 2021-11-13 DIAGNOSIS — I10 Essential (primary) hypertension: Secondary | ICD-10-CM

## 2021-11-13 DIAGNOSIS — R7989 Other specified abnormal findings of blood chemistry: Secondary | ICD-10-CM | POA: Diagnosis not present

## 2021-11-13 DIAGNOSIS — D649 Anemia, unspecified: Secondary | ICD-10-CM | POA: Diagnosis not present

## 2021-11-13 DIAGNOSIS — E1169 Type 2 diabetes mellitus with other specified complication: Secondary | ICD-10-CM

## 2021-11-13 DIAGNOSIS — Z794 Long term (current) use of insulin: Secondary | ICD-10-CM

## 2021-11-13 DIAGNOSIS — R5381 Other malaise: Secondary | ICD-10-CM | POA: Diagnosis not present

## 2021-11-13 LAB — CBC WITH DIFFERENTIAL/PLATELET
Abs Immature Granulocytes: 0.04 10*3/uL (ref 0.00–0.07)
Basophils Absolute: 0.1 10*3/uL (ref 0.0–0.1)
Basophils Relative: 1 %
Eosinophils Absolute: 0.4 10*3/uL (ref 0.0–0.5)
Eosinophils Relative: 6 %
HCT: 35.3 % — ABNORMAL LOW (ref 39.0–52.0)
Hemoglobin: 11.1 g/dL — ABNORMAL LOW (ref 13.0–17.0)
Immature Granulocytes: 1 %
Lymphocytes Relative: 24 %
Lymphs Abs: 1.7 10*3/uL (ref 0.7–4.0)
MCH: 27.5 pg (ref 26.0–34.0)
MCHC: 31.4 g/dL (ref 30.0–36.0)
MCV: 87.4 fL (ref 80.0–100.0)
Monocytes Absolute: 0.4 10*3/uL (ref 0.1–1.0)
Monocytes Relative: 6 %
Neutro Abs: 4.5 10*3/uL (ref 1.7–7.7)
Neutrophils Relative %: 62 %
Platelets: 210 10*3/uL (ref 150–400)
RBC: 4.04 MIL/uL — ABNORMAL LOW (ref 4.22–5.81)
RDW: 14.6 % (ref 11.5–15.5)
WBC: 7.1 10*3/uL (ref 4.0–10.5)
nRBC: 0 % (ref 0.0–0.2)

## 2021-11-13 LAB — COMPREHENSIVE METABOLIC PANEL
ALT: 42 U/L (ref 0–44)
AST: 18 U/L (ref 15–41)
Albumin: 3 g/dL — ABNORMAL LOW (ref 3.5–5.0)
Alkaline Phosphatase: 170 U/L — ABNORMAL HIGH (ref 38–126)
Anion gap: 8 (ref 5–15)
BUN: 6 mg/dL (ref 6–20)
CO2: 26 mmol/L (ref 22–32)
Calcium: 9.1 mg/dL (ref 8.9–10.3)
Chloride: 105 mmol/L (ref 98–111)
Creatinine, Ser: 0.65 mg/dL (ref 0.61–1.24)
GFR, Estimated: >60 mL/min (ref >60)
Glucose, Bld: 129 mg/dL — ABNORMAL HIGH (ref 70–99)
Potassium: 3.7 mmol/L (ref 3.5–5.1)
Sodium: 139 mmol/L (ref 135–145)
Total Bilirubin: 0.6 mg/dL (ref 0.3–1.2)
Total Protein: 6.4 g/dL — ABNORMAL LOW (ref 6.5–8.1)

## 2021-11-13 LAB — GLUCOSE, CAPILLARY
Glucose-Capillary: 126 mg/dL — ABNORMAL HIGH (ref 70–99)
Glucose-Capillary: 191 mg/dL — ABNORMAL HIGH (ref 70–99)
Glucose-Capillary: 149 mg/dL — ABNORMAL HIGH (ref 70–99)

## 2021-11-13 MED ORDER — CHLORHEXIDINE GLUCONATE CLOTH 2 % EX PADS
6.0000 | MEDICATED_PAD | Freq: Every day | CUTANEOUS | Status: DC
  Administered 2021-11-13 – 2021-11-17 (×4): 6 via TOPICAL

## 2021-11-13 NOTE — Plan of Care (Signed)
  Problem: RH Balance Goal: LTG Patient will maintain dynamic sitting balance (PT) Description: LTG:  Patient will maintain dynamic sitting balance with assistance during mobility activities (PT) Flowsheets (Taken 11/13/2021 1259) LTG: Pt will maintain dynamic sitting balance during mobility activities with:: Supervision/Verbal cueing Goal: LTG Patient will maintain dynamic standing balance (PT) Description: LTG:  Patient will maintain dynamic standing balance with assistance during mobility activities (PT) Flowsheets (Taken 11/13/2021 1259) LTG: Pt will maintain dynamic standing balance during mobility activities with:: Contact Guard/Touching assist   Problem: Sit to Stand Goal: LTG:  Patient will perform sit to stand with assistance level (PT) Description: LTG:  Patient will perform sit to stand with assistance level (PT) Flowsheets (Taken 11/13/2021 1259) LTG: PT will perform sit to stand in preparation for functional mobility with assistance level: Supervision/Verbal cueing   Problem: RH Bed Mobility Goal: LTG Patient will perform bed mobility with assist (PT) Description: LTG: Patient will perform bed mobility with assistance, with/without cues (PT). Flowsheets (Taken 11/13/2021 1259) LTG: Pt will perform bed mobility with assistance level of: Supervision/Verbal cueing   Problem: RH Bed to Chair Transfers Goal: LTG Patient will perform bed/chair transfers w/assist (PT) Description: LTG: Patient will perform bed to chair transfers with assistance (PT). Flowsheets (Taken 11/13/2021 1259) LTG: Pt will perform Bed to Chair Transfers with assistance level: Supervision/Verbal cueing   Problem: RH Car Transfers Goal: LTG Patient will perform car transfers with assist (PT) Description: LTG: Patient will perform car transfers with assistance (PT). Flowsheets (Taken 11/13/2021 1259) LTG: Pt will perform car transfers with assist:: Contact Guard/Touching assist   Problem: RH Ambulation Goal:  LTG Patient will ambulate in controlled environment (PT) Description: LTG: Patient will ambulate in a controlled environment, # of feet with assistance (PT). Flowsheets (Taken 11/13/2021 1259) LTG: Pt will ambulate in controlled environ  assist needed:: Supervision/Verbal cueing LTG: Ambulation distance in controlled environment: 148ft using LRAD Goal: LTG Patient will ambulate in home environment (PT) Description: LTG: Patient will ambulate in home environment, # of feet with assistance (PT). Flowsheets (Taken 11/13/2021 1259) LTG: Pt will ambulate in home environ  assist needed:: Supervision/Verbal cueing LTG: Ambulation distance in home environment: 76ft using LRAD   Problem: RH Stairs Goal: LTG Patient will ambulate up and down stairs w/assist (PT) Description: LTG: Patient will ambulate up and down # of stairs with assistance (PT) Flowsheets (Taken 11/13/2021 1259) LTG: Pt will ambulate up/down stairs assist needed:: Contact Guard/Touching assist LTG: Pt will  ambulate up and down number of stairs: 2 steps using LRAD or HRs per home set-up

## 2021-11-13 NOTE — Progress Notes (Signed)
PROGRESS NOTE   Subjective/Complaints: No new concerns elicited this Am. Reports BM yesterday. Reports he slept well.   Review of Systems  Constitutional:  Negative for chills and fever.  HENT:  Negative for congestion.   Respiratory:  Negative for shortness of breath.   Cardiovascular:  Negative for palpitations.  Gastrointestinal:  Negative for abdominal pain, constipation, diarrhea, nausea and vomiting.  Genitourinary: Negative.   Neurological:  Negative for weakness.    Objective:   No results found. Recent Labs    11/13/21 0703  WBC 7.1  HGB 11.1*  HCT 35.3*  PLT 210   Recent Labs    11/13/21 0703  NA 139  K 3.7  CL 105  CO2 26  GLUCOSE 129*  BUN 6  CREATININE 0.65  CALCIUM 9.1    Intake/Output Summary (Last 24 hours) at 11/13/2021 1535 Last data filed at 11/13/2021 1322 Gross per 24 hour  Intake 1070 ml  Output 250 ml  Net 820 ml        Physical Exam: Vital Signs Blood pressure 112/72, pulse 62, temperature 97.8 F (36.6 C), temperature source Oral, resp. rate 17, height 6' (1.829 m), weight 116.2 kg, SpO2 99 %.   General:  No apparent distress HEENT: Head is normocephalic, atraumatic, PERRLA, EOMI, sclera anicteric, oral mucosa pink and moist Neck: Supple without JVD or lymphadenopathy Heart: Reg rate and rhythm. No murmurs rubs or gallops Chest: CTA bilaterally without wheezes, rales, or rhonchi; no distress Abdomen: Soft, non-tender, non-distended, bowel sounds positive. Clear bilious drainage in bile bag Extremities: No clubbing, cyanosis, or edema. Pulses are 2+ Psych: Pt's affect is appropriate. Pt is cooperative Skin: Clean and intact without signs of breakdown GU: foley with yellow urine Neuro: Alert to person and birth date Sensation intact to LT in all 4 extremities Musculoskeletal: Strength 4/5 throughout  No joint swelling noted    Assessment/Plan: 1. Functional deficits  which require 3+ hours per day of interdisciplinary therapy in a comprehensive inpatient rehab setting. Physiatrist is providing close team supervision and 24 hour management of active medical problems listed below. Physiatrist and rehab team continue to assess barriers to discharge/monitor patient progress toward functional and medical goals  Care Tool:  Bathing    Body parts bathed by patient: Chest, Face, Left arm, Right arm, Front perineal area, Right upper leg, Left upper leg   Body parts bathed by helper: Buttocks, Right lower leg, Left lower leg, Abdomen     Bathing assist Assist Level: Moderate Assistance - Patient 50 - 74%     Upper Body Dressing/Undressing Upper body dressing   What is the patient wearing?: Pull over shirt    Upper body assist Assist Level: Minimal Assistance - Patient > 75%    Lower Body Dressing/Undressing Lower body dressing      What is the patient wearing?: Incontinence brief, Pants     Lower body assist Assist for lower body dressing: Moderate Assistance - Patient 50 - 74%     Toileting Toileting    Toileting assist Assist for toileting: Moderate Assistance - Patient 50 - 74%     Transfers Chair/bed transfer  Transfers assist     Chair/bed  transfer assist level: Minimal Assistance - Patient > 75% Chair/bed transfer assistive device: Armrests, Geologist, engineering   Ambulation assist      Assist level: Minimal Assistance - Patient > 75% Assistive device: Walker-rolling Max distance: 141ft   Walk 10 feet activity   Assist     Assist level: Minimal Assistance - Patient > 75% Assistive device: Walker-rolling   Walk 50 feet activity   Assist    Assist level: Minimal Assistance - Patient > 75% Assistive device: Walker-rolling    Walk 150 feet activity   Assist Walk 150 feet activity did not occur: Safety/medical concerns         Walk 10 feet on uneven surface  activity   Assist     Assist  level: Minimal Assistance - Patient > 75% Assistive device: Development worker, international aid     Assist Is the patient using a wheelchair?: Yes Type of Wheelchair: Manual    Wheelchair assist level: Dependent - Patient 0% Max wheelchair distance: >156ft    Wheelchair 50 feet with 2 turns activity    Assist        Assist Level: Dependent - Patient 0%   Wheelchair 150 feet activity     Assist      Assist Level: Dependent - Patient 0%   Blood pressure 112/72, pulse 62, temperature 97.8 F (36.6 C), temperature source Oral, resp. rate 17, height 6' (1.829 m), weight 116.2 kg, SpO2 99 %.   Medical Problem List and Plan: 1. 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 acute respiratory failure, cholecystitis, and urosepsis.             -patient may shower             -ELOS/Goals: 10-14 days S             -Continue CIR with PT/OT, order SLP 2.  Antithrombotics: -DVT/anticoagulation:  Pharmaceutical: Lovenox (start 40 mg daily)             -antiplatelet therapy: none 3. Pain Management: Has chronic back and shoulder pain. Continue  4. Mood/Behavior/Sleep: LCSW to evaluate and provide emotional support                        -antipsychotic agents: n/a             -continue Atarax 10 mg TID prn anxiety 5. Neuropsych/cognition: This patient is not capable of making decisions on his own behalf. 6. Skin/Wound Care: Routine skin care checks             -dysphagia 3/thin liquids; carb modified  -11/9 Consult for SLP  7. Fluids/Electrolytes/Nutrition: Routine Is and Os and follow-up chemistries 8: SVT: converted to SR, continue metoprolol 25 mg BID 9: Sepsis sue to GN UTI: he will continue on Augmentin through 11/11 10: Cholecystitis s/p cholecystostomy tube placement; no current indication for cholecystectomy -follow-up with GS as  outpatient -routine drain site care and record output daily 11: Elevated LFTs: trending downward; follow-up CMP tomorrow/weekly. 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 Elevated LFTs  -11/9 LFT improved today, continue to monitor    LOS: 1 days A FACE TO FACE EVALUATION WAS PERFORMED  Jennye Boroughs 11/13/2021, 3:35 PM

## 2021-11-13 NOTE — Evaluation (Signed)
Occupational Therapy Assessment and Plan  Patient Details  Name: Troy Randall MRN: [id number] Date of Birth: 1960/03/04  OT Diagnosis: abnormal posture, altered mental status, apraxia, cognitive deficits, and muscle weakness (generalized) Rehab Potential: Rehab Potential (ACUTE ONLY): Good ELOS: 3 weeks   Today's Date: 11/13/2021 OT Individual Time:  9767-3419   OT Individual Time calculation: 73 minutes  Hospital Problem: Principal Problem:   Debility   Past Medical History:  Past Medical History:  Diagnosis Date   Anemia    Arthritis    CVA (cerebral vascular accident) ([place])    Diabetes mellitus without complication ([place])    Hyperlipidemia    Hypertension    Kidney stones    Past Surgical History:  Past Surgical History:  Procedure Laterality Date   ANKLE SURGERY     BIOPSY  09/10/2021   Procedure: BIOPSY;  Surgeon: Carol Ada, MD;  Location: [facility] ENDOSCOPY;  Service: Gastroenterology;;   BIOPSY  10/07/2021   Procedure: BIOPSY;  Surgeon: Carol Ada, MD;  Location: [facility] ENDOSCOPY;  Service: Gastroenterology;;   COLONOSCOPY N/A 10/07/2021   Procedure: COLONOSCOPY;  Surgeon: Carol Ada, MD;  Location: [place];  Service: Gastroenterology;  Laterality: N/A;   COLONOSCOPY WITH PROPOFOL N/A 09/06/2021   Procedure: COLONOSCOPY WITH PROPOFOL;  Surgeon: Harvel Quale, MD;  [facility];  Service: Gastroenterology;  Laterality: N/A;   ENTEROSCOPY N/A 09/10/2021   Procedure: ENTEROSCOPY;  Surgeon: Carol Ada, MD;  Location: [place];  Service: Gastroenterology;  Laterality: N/A;   ENTEROSCOPY  09/06/2021   Procedure: ENTEROSCOPY;  Surgeon: Harvel Quale, MD;  [facility];  Service: Gastroenterology;;   ESOPHAGOGASTRODUODENOSCOPY (EGD) WITH PROPOFOL  09/06/2021   Procedure: ESOPHAGOGASTRODUODENOSCOPY (EGD) WITH PROPOFOL;  Surgeon: Harvel Quale, MD;  [facility];  Service: Gastroenterology;;   [place] N/A 09/19/2021   Procedure: Beryle Quant;  Surgeon: Carol Ada, MD;  Location: [place];  Service: Gastroenterology;  Laterality: N/A;   FOREIGN BODY REMOVAL  09/19/2021   Procedure: FOREIGN BODY REMOVAL;  Surgeon: Carol Ada, MD;  Location: [place];  Service: Gastroenterology;;   Freda Munro CAPSULE STUDY  09/06/2021   Procedure: GIVENS CAPSULE STUDY;  Surgeon: Harvel Quale, MD;  [facility];  Service: Gastroenterology;;   HERNIA REPAIR     umbilical x1 Incisional x1   INCISIONAL HERNIA REPAIR  04/13/2011   Procedure: LAPAROSCOPIC INCISIONAL HERNIA;  Surgeon: Jamesetta So, MD;  Location: [facility];  Service: General;  Laterality: N/A;  Recurrent Laparoscopic Incisional Herniorraphy with Mesh   IR ANGIOGRAM SELECTIVE EACH ADDITIONAL VESSEL  09/09/2021   IR ANGIOGRAM VISCERAL SELECTIVE  09/07/2021   IR ANGIOGRAM VISCERAL SELECTIVE  09/06/2021   IR ANGIOGRAM VISCERAL SELECTIVE  09/06/2021   IR CHOLANGIOGRAM EXISTING TUBE  11/06/2021   IR EMBO ART  VEN HEMORR LYMPH EXTRAV  INC GUIDE ROADMAPPING  09/06/2021   IR EXCHANGE BILIARY DRAIN  10/24/2021   IR GUIDED DRAIN W CATHETER PLACEMENT  09/07/2021   IR US GUIDE BX ASP/DRAIN  09/07/2021   IR US GUIDE VASC ACCESS RIGHT  09/07/2021   IR US GUIDE VASC ACCESS RIGHT  09/06/2021   KIDNEY STONE SURGERY      Assessment & Plan Clinical Impression:  Pt is a 61 year old male with medical hx significant for: lower GI bleed, recent diagnosis of acute cholecystitis s/p potscholecystostomy tube (09/07/21), DM II, HTN, hyperlipidemia. Pt was recently admitted to CIR from 10/30/21-11/06/21 after prolonged hospitalization. 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 Due to concern for Gulliain-Barre disease, pt was transferred to [facility]. Pt was intubated, trached, and underwent treatment that included plasma exchange.  Hospitalization complicated by diagnosis of acute cholecystitis; pt is s/p percutaneous cholecystostomy drain (09/07/21). Cholecystostomy tube was exchanged by IR on 10/24/21.In CIR, pt's foley catheter removed and pt was unable to urinate after that time. Pt developed tachycardia with  heart rates in 150s-160s with associated EKG suggestive of SVT. Pt was febrile. Rapid response called d/t SVT. Pt transferred back to acute care of [facility]. Chest x-ray showed no acute cardiopulmonary process. Blood cultures positive for E.coli. Urine culture positive for Klebsiella. Abdominal US with decompressed gallbladder, cholecystectomy tube in place, with no other abnormalities. Pt diagnosed with severe sepsis d/t UTI. On 11/3 foley catheter reinserted d/t persistent urinary retention. Patient transferred to CIR on 11/12/2021 .    Patient currently requires mod-max A with basic self-care skills secondary to muscle weakness, decreased cardiorespiratoy endurance, motor apraxia, decreased coordination, and decreased motor planning, decreased attention to left, decreased motor planning, and ideational apraxia, decreased attention, decreased awareness, decreased problem solving, decreased safety awareness, decreased memory, and delayed processing,  , and decreased sitting balance, decreased standing balance, decreased postural control, and decreased balance strategies.  Prior to hospitalization, patient could complete BADLs with independent .  Patient will benefit from skilled intervention to decrease level of assist with basic self-care skills, increase independence with basic self-care skills, and increase level of independence with iADL prior to discharge home with care partner.  Anticipate patient will require 24 hour supervision and follow up home health.  OT - End of Session Activity Tolerance: Decreased this session Endurance Deficit: Yes Endurance Deficit Description: requries frequent seated rest breaks  and pt reports feeling fatigued OT Assessment Rehab Potential (ACUTE ONLY): Good OT Patient demonstrates impairments in the following area(s): Balance;Endurance;Perception;Motor;Safety;Cognition OT Basic ADL's Functional Problem(s): Grooming;Bathing;Dressing;Toileting OT Transfers Functional Problem(s): Toilet;Tub/Shower OT Plan OT Intensity: Minimum of 1-2 x/day, 45 to 90 minutes OT Frequency: 5 out of 7 days OT Duration/Estimated Length of Stay: 3 weeks OT Treatment/Interventions: Balance/vestibular training;Cognitive remediation/compensation;DME/adaptive equipment instruction;Discharge planning;Functional mobility training;Neuromuscular re-education;Psychosocial support;Patient/family education;Self Care/advanced ADL retraining;UE/LE Strength taining/ROM;Therapeutic Exercise;Therapeutic Activities;UE/LE Coordination activities;Visual/perceptual remediation/compensation;Community reintegration;Wheelchair propulsion/positioning;Pain management;Skin care/wound managment OT Self Feeding Anticipated Outcome(s): Mod I OT Basic Self-Care Anticipated Outcome(s): supervision OT Toileting Anticipated Outcome(s): supervision OT Bathroom Transfers Anticipated Outcome(s): supervision OT Recommendation Patient destination: Home Follow Up Recommendations: Home health OT Equipment Recommended: 3 in 1 bedside comode;Tub/shower bench Equipment Details: bariatric bedside comode, hospital bed, baritric shower chair   OT Evaluation Precautions/Restrictions  Precautions Precautions: Fall Precaution Comments: R upper quadrant drain Restrictions Weight Bearing Restrictions: No Other Position/Activity Restrictions: pt unable to sit on bottom for extended periods of time due to pain (even on geomat) General Family/Caregiver Present: Yes Vital Signs Therapy Vitals Temp: 97.8 F (36.6 C) Temp Source: Oral Pulse Rate: 62 Resp: 17 BP: 112/72 Patient Position (if appropriate): Lying Oxygen  Therapy SpO2: 99 % O2 Device: Room Air Pain Pain Assessment Pain Scale: 0-10 Pain Score: 0-No pain Home Living/Prior Functioning Home Living Family/patient expects to be discharged to:: Private residence Living Arrangements: Spouse/significant other Available Help at Discharge: Family, Available 24 hours/day, Other (Comment) Type of Home: House Home Access: Stairs to enter [employer] of Steps: 2 Entrance Stairs-Rails: None Home Layout: Able to live on main level  with bedroom/bathroom, Laundry or work area in basement, One level Alternate Level Stairs-Number of Steps: chair lift down to basement; pt and wife live in his father's old house that was set up for the father. Have grab bars in the bathroom and built in shower seat. Alternate Level Stairs-Rails: Left Bathroom Shower/Tub: Multimedia programmer: Handicapped height Bathroom Accessibility: No  Lives With: Spouse IADL History Homemaking Responsibilities: No Current License: Yes Mode of Transportation: Musician Occupation: Retired Prior Function Level of Independence: Independent with gait, Independent with transfers, Independent with homemaking with ambulation  Able to Take Stairs?: Yes Driving: Yes Vocation: Retired Surveyor, mining Baseline Vision/History: 1 Wears glasses Ability to See in Adequate Light: 2 Moderately impaired Patient Visual Report: No change from baseline Vision Assessment?: Yes Eye Alignment: Within Functional Limits Ocular Range of Motion: Within Functional Limits Alignment/Gaze Preference: Within Defined Limits Tracking/Visual Pursuits: Able to track stimulus in all quads without difficulty Saccades: Within functional limits Convergence: Within functional limits Visual Fields: No apparent deficits Perception  Perception: Impaired Inattention/Neglect: Does not attend to left side of body;Does not attend to left visual field Praxis Praxis: Impaired Praxis Impairment Details: Motor  planning;Perseveration;Ideomotor Cognition Cognition Overall Cognitive Status: Impaired/Different from baseline Arousal/Alertness: Awake/alert Orientation Level: Person Memory: Impaired Memory Impairment: Storage deficit;Decreased recall of new information;Decreased short term memory Decreased Short Term Memory: Verbal basic Attention: Focused;Sustained;Selective Focused Attention: Impaired Focused Attention Impairment: Verbal basic Sustained Attention: Impaired Sustained Attention Impairment: Verbal basic Selective Attention: Impaired Awareness: Impaired Awareness Impairment: Intellectual impairment Problem Solving: Impaired Problem Solving Impairment: Verbal basic;Functional basic Behaviors: Perseveration Safety/Judgment: Impaired Brief Interview for Mental Status (BIMS) Repetition of Three Words (First Attempt): 3 Temporal Orientation: Year: Missed by more than 5 years Temporal Orientation: Month: Missed by more than 1 month Temporal Orientation: Day: Incorrect Recall: "Sock": No, could not recall Recall: "Blue": Yes, no cue required Recall: "Bed": Yes, no cue required BIMS Summary Score: 7 Sensation Sensation Light Touch: Appears Intact Hot/Cold: Not tested Proprioception: Impaired by gross assessment Stereognosis: Not tested Coordination Gross Motor Movements are Fluid and Coordinated: No Fine Motor Movements are Fluid and Coordinated: No Coordination and Movement Description: functional strength deficits with decreased coordination of LUE movements, perseveration Finger Nose Finger Test: unable to follow directions for test Motor  Motor Motor: Other (comment);Abnormal postural alignment and control;Motor perseverations Motor - Skilled Clinical Observations: generalized weakness and deconditioning  Trunk/Postural Assessment  Cervical Assessment Cervical Assessment: Within Functional Limits Thoracic Assessment Thoracic Assessment: Exceptions to [facility] Lumbar  Assessment Lumbar Assessment: Exceptions to [facility] Postural Control Postural Control: Deficits on evaluation Postural Limitations: decreased with pt relying on B UE support on RW to improve balance  Balance Balance Balance Assessed: Yes Standardized Balance Assessment Standardized Balance Assessment: Berg Balance Test (pt with significant endurance deficits with standing requiring seated rest break between almost every test item; pt also with difficulty following commands to complete certain items (ex: narrow BOS)) Berg Balance Test Sit to Stand: Needs minimal aid to stand or to stabilize Standing Unsupported: Unable to stand 30 seconds unassisted (posterior LOB at 17 seconds, stepping back and using backs of legs against mat) Sitting with Back Unsupported but Feet Supported on Floor or Stool: Able to sit 2 minutes under supervision Stand to Sit: Uses backs of legs against chair to control descent Transfers: Needs one person to assist Standing Unsupported with Eyes Closed: Needs help to keep from falling (mild LOB at 8 seconds requiring assist to maintain upright) Standing Ubsupported with Feet Together: Needs help to  attain position and unable to hold for 15 seconds From Standing, Reach Forward with Outstretched Arm: Loses balance while trying/requires external support From Standing Position, Pick up Object from Floor: Unable to try/needs assist to keep balance From Standing Position, Turn to Look Behind Over each Shoulder: Needs assist to keep from losing balance and falling Turn 360 Degrees: Needs assistance while turning Standing Unsupported, Alternately Place Feet on Step/Stool: Needs assistance to keep from falling or unable to try Standing Unsupported, One Foot in Front: Loses balance while stepping or standing Standing on One Leg: Unable to try or needs assist to prevent fall Total Score: 7 Static Sitting Balance Static Sitting - Balance Support: Feet supported Static Sitting - Level  of Assistance: 5: Stand by assistance Dynamic Sitting Balance Dynamic Sitting - Balance Support: Feet supported Dynamic Sitting - Level of Assistance: 5: Stand by assistance;4: Min assist Sitting balance - Comments: prefers UE support Static Standing Balance Static Standing - Balance Support: During functional activity;Bilateral upper extremity supported Static Standing - Level of Assistance: Other (comment);4: Min assist Dynamic Standing Balance Dynamic Standing - Balance Support: During functional activity;Bilateral upper extremity supported Dynamic Standing - Level of Assistance: 4: Min assist;3: Mod assist Extremity/Trunk Assessment RUE Assessment RUE Assessment: Within Functional Limits LUE Assessment LUE Assessment: Within Functional Limits Active Range of Motion (AROM) Comments: WFL, but Pt does not attend to L side of body or L visual field unless given moderate cues  Care Tool Care Tool Self Care Eating    Min A    Oral Care     Min A    Bathing  Mod A            Upper Body Dressing(including orthotics)  Min A           Lower Body Dressing (excluding footwear)  Mod A        Putting on/Taking off footwear    Max A         Care Tool Toileting Toileting activity    Max A     Care Tool Bed Mobility Roll left and right activity    Min A    Sit to lying activity    Min A    Lying to sitting on side of bed activity    Min A     Care Tool Transfers Sit to stand transfer    Min A    Chair/bed transfer    Min A     Toilet transfer    Min A     Care Tool Cognition  Expression of Ideas and Wants  3. Some difficulty   Understanding Verbal and Non-Verbal Content  2. Sometimes understands    Memory/Recall Ability  That he or she is in the hospital    Refer to Care Plan for Long Term Goals  SHORT TERM GOAL WEEK 1 OT Short Term Goal 1 (Week 1): Pt will complete UB dressing with supervision OT Short Term Goal 2 (Week 1): Pt will complete LB  dressing with min A OT Short Term Goal 3 (Week 1): Pt will completed U/LB bathing wiht min A OT Short Term Goal 4 (Week 1): Pt will complete toileting with min A  Recommendations for other services: None    Skilled Therapeutic Intervention OT evaluation and treatment initiated with skilled education on OT roles and goals provided. Pt received in room with wife present. Pt laying in bed reporting urgent need to have a bowel movement. OT attempted to get [facility]  for toileting, but Pt incontinent of bowel having bowel movement in brief. Pt rolled R/L with supervision to assist with peri care and cleaning up bed. Pt supine>EOB min A. Pt ambulated to bathroom RW CGA. Pt completed U/LB bathing seated on shower seat with multimodal cues provided throughout task. Pt perseverating on washing R upper leg, chest, and squeezing out wash cloth requiring maximal cues to initiate washing different body part. Pt min A transfer<>tub bench. Following shower, Pt began to ambulate to bed, but had another bowel movement while standing with RW. Pt able to maintain standing balance with CGA RW while therapist cleaned up floor and Pt. Pt completed UB dressing sitting EOB with min A and LB dressing EOB mod A. Pt EOB>supine min A with verbal cues on body mechanics provided.   ADL ADL Eating: Minimal assistance Grooming: Minimal assistance;Maximal cueing Where Assessed-Grooming: Edge of bed Upper Body Bathing: Maximal cueing;Minimal assistance Where Assessed-Upper Body Bathing: Shower Lower Body Bathing: Maximal cueing;Moderate assistance Where Assessed-Lower Body Bathing: Shower Upper Body Dressing: Maximal cueing;Minimal assistance Where Assessed-Upper Body Dressing: Edge of bed Lower Body Dressing: Maximal cueing;Moderate assistance Where Assessed-Lower Body Dressing: Edge of bed Toileting: Dependent Where Assessed-Toileting: Bed level Toilet Transfer: Minimal assistance Science writer: Chief of Staff: Maximal cueing;Minimal assistance Social research officer, government Method: Heritage manager: Transfer tub bench Mobility  Bed Mobility Bed Mobility: Supine to Sit;Sit to Supine Rolling Right: Supervision/verbal cueing Rolling Left: Supervision/Verbal cueing Supine to Sit: Minimal Assistance - Patient > 75% Sit to Supine: Minimal Assistance - Patient > 75% Transfers Sit to Stand: Minimal Assistance - Patient > 75% Stand to Sit: Minimal Assistance - Patient > 75%   Discharge Criteria: Patient will be discharged from OT if patient refuses treatment 3 consecutive times without medical reason, if treatment goals not met, if there is a change in medical status, if patient makes no progress towards goals or if patient is discharged from hospital.  The above assessment, treatment plan, treatment alternatives and goals were discussed and mutually agreed upon: by patient and by family  Janey Genta 11/13/2021, 5:05 PM

## 2021-11-13 NOTE — Plan of Care (Signed)
Problem: RH Balance Goal: LTG: Patient will maintain dynamic sitting balance (OT) Description: LTG:  Patient will maintain dynamic sitting balance with assistance during activities of daily living (OT) Flowsheets (Taken 11/13/2021 1725) LTG: Pt will maintain dynamic sitting balance during ADLs with: Supervision/Verbal cueing Goal: LTG Patient will maintain dynamic standing with ADLs (OT) Description: LTG:  Patient will maintain dynamic standing balance with assist during activities of daily living (OT)  Flowsheets (Taken 11/13/2021 1725) LTG: Pt will maintain dynamic standing balance during ADLs with: Supervision/Verbal cueing   Problem: Sit to Stand Goal: LTG:  Patient will perform sit to stand in prep for activites of daily living with assistance level (OT) Description: LTG:  Patient will perform sit to stand in prep for activites of daily living with assistance level (OT) Flowsheets (Taken 11/13/2021 1725) LTG: PT will perform sit to stand in prep for activites of daily living with assistance level: Supervision/Verbal cueing   Problem: RH Grooming Goal: LTG Patient will perform grooming w/assist,cues/equip (OT) Description: LTG: Patient will perform grooming with assist, with/without cues using equipment (OT) Flowsheets (Taken 11/13/2021 1725) LTG: Pt will perform grooming with assistance level of: Supervision/Verbal cueing   Problem: RH Bathing Goal: LTG Patient will bathe all body parts with assist levels (OT) Description: LTG: Patient will bathe all body parts with assist levels (OT) Flowsheets (Taken 11/13/2021 1725) LTG: Pt will perform bathing with assistance level/cueing: Supervision/Verbal cueing LTG: Position pt will perform bathing: Shower   Problem: RH Dressing Goal: LTG Patient will perform upper body dressing (OT) Description: LTG Patient will perform upper body dressing with assist, with/without cues (OT). Flowsheets (Taken 11/13/2021 1725) LTG: Pt will perform upper  body dressing with assistance level of: Supervision/Verbal cueing Goal: LTG Patient will perform lower body dressing w/assist (OT) Description: LTG: Patient will perform lower body dressing with assist, with/without cues in positioning using equipment (OT) Flowsheets (Taken 11/13/2021 1725) LTG: Pt will perform lower body dressing with assistance level of: Supervision/Verbal cueing   Problem: RH Toileting Goal: LTG Patient will perform toileting task (3/3 steps) with assistance level (OT) Description: LTG: Patient will perform toileting task (3/3 steps) with assistance level (OT)  Flowsheets (Taken 11/13/2021 1725) LTG: Pt will perform toileting task (3/3 steps) with assistance level: Supervision/Verbal cueing   Problem: RH Toilet Transfers Goal: LTG Patient will perform toilet transfers w/assist (OT) Description: LTG: Patient will perform toilet transfers with assist, with/without cues using equipment (OT) Flowsheets (Taken 11/13/2021 1725) LTG: Pt will perform toilet transfers with assistance level of: Supervision/Verbal cueing   Problem: RH Tub/Shower Transfers Goal: LTG Patient will perform tub/shower transfers w/assist (OT) Description: LTG: Patient will perform tub/shower transfers with assist, with/without cues using equipment (OT) Flowsheets (Taken 11/13/2021 1725) LTG: Pt will perform tub/shower stall transfers with assistance level of: Supervision/Verbal cueing LTG: Pt will perform tub/shower transfers from: Walk in shower   Problem: RH Memory Goal: LTG Patient will demonstrate ability for day to day recall/carry over during activities of daily living with assistance level (OT) Description: LTG:  Patient will demonstrate ability for day to day recall/carry over during activities of daily living with assistance level (OT). Flowsheets (Taken 11/13/2021 1725) LTG:  Patient will demonstrate ability for day to day recall/carry over during activities of daily living with assistance level  (OT): Minimal Assistance - Patient > 75%   Problem: RH Awareness Goal: LTG: Patient will demonstrate awareness during functional activites type of (OT) Description: LTG: Patient will demonstrate awareness during functional activites type of (OT) Flowsheets (Taken 11/13/2021  1725) Patient will demonstrate awareness during functional activites type of: Intellectual LTG: Patient will demonstrate awareness during functional activites type of (OT): Minimal Assistance - Patient > 75%

## 2021-11-13 NOTE — Progress Notes (Signed)
Inpatient Rehabilitation  Patient information reviewed and entered into eRehab system by Lamar Naef Lesette Frary, OTR/L, Rehab Quality Coordinator.   Information including medical coding, functional ability and quality indicators will be reviewed and updated through discharge.   

## 2021-11-13 NOTE — Progress Notes (Signed)
[facility] Individual Statement of Services  Patient Name:  Troy Randall  Date:  11/13/2021  Welcome to the [facility].  Our goal is to provide you with an individualized program based on your diagnosis and situation, designed to meet your specific needs.  With this comprehensive rehabilitation program, you will be expected to participate in at least 3 hours of rehabilitation therapies Monday-Friday, with modified therapy programming on the weekends.  Your rehabilitation program will include the following services:  Physical Therapy (PT), Occupational Therapy (OT), Speech Therapy (ST), 24 hour per day rehabilitation nursing, Therapeutic Recreaction (TR), Neuropsychology, Care Coordinator, Rehabilitation Medicine, Nutrition Services, Pharmacy Services, and Other  Weekly team conferences will be held on Wednesdays to discuss your progress.  Your Inpatient Rehabilitation Care Coordinator will talk with you frequently to get your input and to update you on team discussions.  Team conferences with you and your family in attendance may also be held.  Expected length of stay: 12-14 Days  Overall anticipated outcome:  Supervision  Depending on your progress and recovery, your program may change. Your Inpatient Rehabilitation Care Coordinator will coordinate services and will keep you informed of any changes. Your Inpatient Rehabilitation Care Coordinator's name and contact numbers are listed  below.  The following services may also be recommended but are not provided by the Inpatient Rehabilitation Center:   Home Health Rehabiltiation Services Outpatient Rehabilitation Services    Arrangements will be made to provide these services after discharge if needed.  Arrangements include referral to agencies that provide these services.  Your insurance has been verified to be:   [employer]  Your primary doctor is:   Nita Sells, MD  Pertinent information will  be shared with your doctor and your insurance company.  Inpatient Rehabilitation Care Coordinator:  Lavera Guise, [place] [phone] or [phone]  Information discussed with and copy given to patient by: [name], 11/13/2021, 9:54 AM

## 2021-11-13 NOTE — Progress Notes (Signed)
Inpatient Rehabilitation Care Coordinator Assessment and Plan Patient Details  Name: Troy Randall MRN: [id number] Date of Birth: 1960-11-14  Today's Date: 11/13/2021  Hospital Problems: Principal Problem:   Debility  Past Medical History:  Past Medical History:  Diagnosis Date   Anemia    Arthritis    CVA (cerebral vascular accident) (HCC)    Diabetes mellitus without complication (HCC)    Hyperlipidemia    Hypertension    Kidney stones    Past Surgical History:  Past Surgical History:  Procedure Laterality Date   ANKLE SURGERY     BIOPSY  09/10/2021   Procedure: BIOPSY;  Surgeon: Jeani Hawking, MD;  Location: [facility] ENDOSCOPY;  Service: Gastroenterology;;   BIOPSY  10/07/2021   Procedure: BIOPSY;  Surgeon: Jeani Hawking, MD;  Location: [facility] ENDOSCOPY;  Service: Gastroenterology;;   COLONOSCOPY N/A 10/07/2021   Procedure: COLONOSCOPY;  Surgeon: Jeani Hawking, MD;  Location: [facility] ENDOSCOPY;  Service: Gastroenterology;  Laterality: N/A;   COLONOSCOPY WITH PROPOFOL N/A 09/06/2021   Procedure: COLONOSCOPY WITH PROPOFOL;  Surgeon: Dolores Frame, MD;  Location: [facility] ENDO SUITE;  Service: Gastroenterology;  Laterality: N/A;   ENTEROSCOPY N/A 09/10/2021   Procedure: ENTEROSCOPY;  Surgeon: Jeani Hawking, MD;  Location: [facility] ENDOSCOPY;  Service: Gastroenterology;  Laterality: N/A;   ENTEROSCOPY  09/06/2021   Procedure: ENTEROSCOPY;  Surgeon: Dolores Frame, MD;  Location: [facility] ENDO SUITE;  Service: Gastroenterology;;   ESOPHAGOGASTRODUODENOSCOPY (EGD) WITH PROPOFOL  09/06/2021   Procedure: ESOPHAGOGASTRODUODENOSCOPY (EGD) WITH PROPOFOL;  Surgeon: Dolores Frame, MD;  Location: [facility] ENDO SUITE;  Service: Gastroenterology;;   Wenda Low SIGMOIDOSCOPY N/A 09/19/2021   Procedure: Arnell Sieving;  Surgeon: Jeani Hawking, MD;  Location: [facility] ENDOSCOPY;  Service: Gastroenterology;  Laterality: N/A;   FOREIGN BODY REMOVAL  09/19/2021   Procedure: FOREIGN BODY REMOVAL;  Surgeon:  Jeani Hawking, MD;  Location: [facility] ENDOSCOPY;  Service: Gastroenterology;;   Emelda Brothers CAPSULE STUDY  09/06/2021   Procedure: GIVENS CAPSULE STUDY;  Surgeon: Dolores Frame, MD;  Location: [facility] ENDO SUITE;  Service: Gastroenterology;;   HERNIA REPAIR     umbilical x1 Incisional x1   INCISIONAL HERNIA REPAIR  04/13/2011   Procedure: LAPAROSCOPIC INCISIONAL HERNIA;  Surgeon: Dalia Heading, MD;  Location: [facility] ORS;  Service: General;  Laterality: N/A;  Recurrent Laparoscopic Incisional Herniorraphy with Mesh   IR ANGIOGRAM SELECTIVE EACH ADDITIONAL VESSEL  09/09/2021   IR ANGIOGRAM VISCERAL SELECTIVE  09/07/2021   IR ANGIOGRAM VISCERAL SELECTIVE  09/06/2021   IR ANGIOGRAM VISCERAL SELECTIVE  09/06/2021   IR CHOLANGIOGRAM EXISTING TUBE  11/06/2021   IR EMBO ART  VEN HEMORR LYMPH EXTRAV  INC GUIDE ROADMAPPING  09/06/2021   IR EXCHANGE BILIARY DRAIN  10/24/2021   IR GUIDED DRAIN W CATHETER PLACEMENT  09/07/2021   IR US GUIDE BX ASP/DRAIN  09/07/2021   IR US GUIDE VASC ACCESS RIGHT  09/07/2021   IR US GUIDE VASC ACCESS RIGHT  09/06/2021   KIDNEY STONE SURGERY     Social History:  reports that he has never smoked. His smokeless tobacco use includes snuff. He reports that he does not drink alcohol and does not use drugs.  Family / Support Systems Patient Roles: Spouse Spouse/Significant Other: [name] Anticipated Caregiver: Angie Ability/Limitations of Caregiver: Min A Caregiver Availability: 24/7 Family Dynamics: support from spouse  Social History Preferred language: English Religion: [employer] - How often do you need to have someone help you when you read instructions, pamphlets, or other written material from your doctor  or pharmacy?: Never Writes: Yes   Abuse/Neglect Abuse/Neglect Assessment Can Be Completed: Yes Physical Abuse: Denies Verbal Abuse: Denies Sexual Abuse: Denies Exploitation of patient/patient's resources: Denies Self-Neglect: Denies  Patient response to: Social  Isolation - How often do you feel lonely or isolated from those around you?: Never  Emotional Status Recent Psychosocial Issues: coping Psychiatric History: n/a Substance Abuse History: n/a  Patient / Family Perceptions, Expectations & Goals Pt/Family understanding of illness & functional limitations: yes Premorbid pt/family roles/activities: Independent overall and driving Anticipated changes in roles/activities/participation: Spouse anticpates Min A, 24/7 Pt/family expectations/goals: Supervision  [employer] Agencies: None Premorbid Home Care/DME Agencies: Other (Comment) Transportation available at discharge: spouse able to transport Is the patient able to respond to transportation needs?: Yes In the past 12 months, has lack of transportation kept you from medical appointments or from getting medications?: No In the past 12 months, has lack of transportation kept you from meetings, work, or from getting things needed for daily living?: No Resource referrals recommended: Neuropsychology  Discharge Planning Living Arrangements: Spouse/significant other Support Systems: Spouse/significant other, Children Type of Residence: Private residence Insurance Resources: Media pl[employer] (specify) Administrator Medicare) Financial Resources: Family Support Financial Screen Referred: No Living Expenses: Lives with family Money Management: Patient, Spouse Does the patient have any problems obtaining your medications?: No Home Management: Independent Patient/Family Preliminary Plans: Spouse able to assist if needed (Min A) Care Coordinator Barriers to Discharge: Insurance for SNF coverage, Decreased caregiver support, Lack of/limited family support, Inaccessible home environment Care Coordinator Anticipated Follow Up Needs: HH/OP Expected length of stay: 12-14 Days  Clinical Impression Patient readmitted. SW went in to meet with patient and spouse. No additional questions or  concerns.  [name] 11/13/2021, 12:12 PM

## 2021-11-13 NOTE — Evaluation (Signed)
Physical Therapy Assessment and Plan  Patient Details  Name: Troy Randall MRN: [id number] Date of Birth: 07-14-1960  PT Diagnosis: Abnormal posture, Abnormality of gait, Cognitive deficits, Difficulty walking, Impaired cognition, Muscle weakness, and Pain in back and R upper quadrant where drain is located Rehab Potential: Good ELOS: ~ 2 weeks   Today's Date: 11/13/2021 PT Individual Time: 1051-1200 PT Individual Time Calculation (min): 69 min    Hospital Problem: Principal Problem:   Debility   Past Medical History:  Past Medical History:  Diagnosis Date   Anemia    Arthritis    CVA (cerebral vascular accident) ([place])    Diabetes mellitus without complication ([place])    Hyperlipidemia    Hypertension    Kidney stones    Past Surgical History:  Past Surgical History:  Procedure Laterality Date   ANKLE SURGERY     BIOPSY  09/10/2021   Procedure: BIOPSY;  Surgeon: Carol Ada, MD;  Location: [facility] ENDOSCOPY;  Service: Gastroenterology;;   BIOPSY  10/07/2021   Procedure: BIOPSY;  Surgeon: Carol Ada, MD;  Location: [facility] ENDOSCOPY;  Service: Gastroenterology;;   COLONOSCOPY N/A 10/07/2021   Procedure: COLONOSCOPY;  Surgeon: Carol Ada, MD;  Location: [place];  Service: Gastroenterology;  Laterality: N/A;   COLONOSCOPY WITH PROPOFOL N/A 09/06/2021   Procedure: COLONOSCOPY WITH PROPOFOL;  Surgeon: Harvel Quale, MD;  [facility];  Service: Gastroenterology;  Laterality: N/A;   ENTEROSCOPY N/A 09/10/2021   Procedure: ENTEROSCOPY;  Surgeon: Carol Ada, MD;  Location: [place];  Service: Gastroenterology;  Laterality: N/A;   ENTEROSCOPY  09/06/2021   Procedure: ENTEROSCOPY;  Surgeon: Harvel Quale, MD;  [facility];  Service: Gastroenterology;;   ESOPHAGOGASTRODUODENOSCOPY (EGD) WITH PROPOFOL  09/06/2021   Procedure: ESOPHAGOGASTRODUODENOSCOPY (EGD) WITH PROPOFOL;  Surgeon: Harvel Quale, MD;  [facility];   Service: Gastroenterology;;   [place] N/A 09/19/2021   Procedure: Beryle Quant;  Surgeon: Carol Ada, MD;  Location: [place];  Service: Gastroenterology;  Laterality: N/A;   FOREIGN BODY REMOVAL  09/19/2021   Procedure: FOREIGN BODY REMOVAL;  Surgeon: Carol Ada, MD;  Location: [place];  Service: Gastroenterology;;   Freda Munro CAPSULE STUDY  09/06/2021   Procedure: GIVENS CAPSULE STUDY;  Surgeon: Harvel Quale, MD;  [facility];  Service: Gastroenterology;;   HERNIA REPAIR     umbilical x1 Incisional x1   INCISIONAL HERNIA REPAIR  04/13/2011   Procedure: LAPAROSCOPIC INCISIONAL HERNIA;  Surgeon: Jamesetta So, MD;  Location: [facility];  Service: General;  Laterality: N/A;  Recurrent Laparoscopic Incisional Herniorraphy with Mesh   IR ANGIOGRAM SELECTIVE EACH ADDITIONAL VESSEL  09/09/2021   IR ANGIOGRAM VISCERAL SELECTIVE  09/07/2021   IR ANGIOGRAM VISCERAL SELECTIVE  09/06/2021   IR ANGIOGRAM VISCERAL SELECTIVE  09/06/2021   IR CHOLANGIOGRAM EXISTING TUBE  11/06/2021   IR EMBO ART  VEN HEMORR LYMPH EXTRAV  INC GUIDE ROADMAPPING  09/06/2021   IR EXCHANGE BILIARY DRAIN  10/24/2021   IR GUIDED DRAIN W CATHETER PLACEMENT  09/07/2021   IR US GUIDE BX ASP/DRAIN  09/07/2021   IR US GUIDE VASC ACCESS RIGHT  09/07/2021   IR US GUIDE VASC ACCESS RIGHT  09/06/2021   KIDNEY STONE SURGERY      Assessment & Plan Clinical Impression: Patient is a 61 y.o. R handed  male who presented to [facility] emergency department on 09/04/2021 reporting hematochezia associated with diarrhea and abdominal cramping.  He has a past medical history  of CVA x2 ( had fully recovered from both- latest 4/23) maintained on Plavix and aspirin.  He had had a normal colonoscopy in February 2023.  Gastroenterology was consulted.  He underwent EGD with deployment of endoscopic capsule and colonoscopy on 9/2.  Interventional radiology was consulted and he underwent coil of the jejunal branch of the  SMA on 9/2. Active bleeding noted in the proximal jejunum which was tattooed and hemostatic clip placed.  He underwent flexible sigmoidoscopy on 9/15 to retrieve endoscopic capsule and colonoscopy was performed on 10/3 with findings of large superficial ulceration of the sigmoid colon.  This was biopsied and consistent with chronic colitis.          He required approximately 20 units of packed red blood cells.  His hemoglobin is stable and improving. His hospitalization was complicated by cholecystitis and he underwent percutaneous cholecystostomy tube placement, acute renal failure requiring CRRT and respiratory failure with subsequent tracheostomy 9/11.  LFTs and renal function are normal. CT head performed secondary to acute encephalopathy and history of prior CVA.  MRI of the brain performed secondary to multiple interval white matter insults versus inflammatory process.  Neurology consultation obtained on 9/17.  The patient underwent laboratory work-up including lumbar puncture.  He was treated with IV steroids with mild improvement.  He then underwent full course of plasma exchange completed on 10/11.  MRI completed of the cervical and thoracic spine.  He will follow-up with neurology as outpatient.   His Foley catheter was removed on 10/9 however he was unable to void spontaneously and his catheter was reinserted 10/10.   He was admitted to rehab 10/30/2021 and noted to have increased frequency of stools and C. difficile toxin checked.  Protonix increased to twice daily dosing.  C. difficile toxin negative.  Follow-up labs stable on 10/30.  Patient was ambulating 95 feet with physical therapy and due to history of GI bleed, Lovenox was discontinued and SCDs applied. Flomax discontinued and doxazosin continued for urinary retention. Complained of low back pain and bilateral shoulder pain and Lidoderm patches were changed to 3 patches from 8 PM to 8 AM and to continue Tylenol.  Continued on dysphagia 3  diet with nectar thick liquids. Voiding trial on 11/2. At approximately 6:30 pm, RN noted rigors and rectal temp of 100. Sepsis work-up initiated. Recheck of vitals signs with heart rate in 160s. Rapid response called. Transferred to acute care/[name] service to monitored bed for SVT and sepsis work-up.  Blood cultures were positive for E. coli.  Intravenous cephalosporins initiated.  CT of the abdomen pelvis without signs of deep infection or collection.  He has remained in sinus rhythm on metoprolol.  Intravenous cefazolin changed to Augmentin which she will continue through 11/11.  Foley catheter remains in place.  Elevated liver function tests trending downward.  Continue drain care.  H&H have remained stable that reports of recurrent GI bleeding.The patient requires inpatient physical medicine and rehabilitation evaluations and treatment secondary to dysfunction due to multiple medical issues including disseminated encephalomyelitis, urosepsis. Patient feels ready to return to CIR today.  Patient transferred to CIR on 11/12/2021 .   Patient currently requires min/mod assist with mobility secondary to muscle weakness, decreased cardiorespiratoy endurance, impaired timing and sequencing, unbalanced muscle activation, decreased coordination, and decreased motor planning, decreased attention to left and decreased motor planning, decreased initiation, decreased attention, decreased awareness, decreased problem solving, decreased safety awareness, decreased memory, and delayed processing, and decreased sitting balance, decreased standing balance, decreased postural  control, and decreased balance strategies.  Prior to hospitalization, patient was independent  with mobility and lived with Spouse in a House home.  Home access is 2Stairs to enter.  Patient will benefit from skilled PT intervention to maximize safe functional mobility, minimize fall risk, and decrease caregiver burden for planned discharge home with 24  hour supervision.  Anticipate patient will benefit from follow up [place] at discharge.  PT - End of Session Activity Tolerance: Tolerates 30+ min activity with multiple rests Endurance Deficit: Yes Endurance Deficit Description: requries frequent seated rest breaks and pt reports feeling fatigued PT Assessment Rehab Potential (ACUTE/IP ONLY): Good PT Barriers to Discharge: Incontinence PT Patient demonstrates impairments in the following area(s): Balance;Pain;Behavior;Perception;Edema;Safety;Sensory;Endurance;Motor;Skin Integrity;Nutrition PT Transfers Functional Problem(s): Bed Mobility;Bed to Chair;Car;Furniture;Floor PT Locomotion Functional Problem(s): Ambulation;Stairs PT Plan PT Intensity: Minimum of 1-2 x/day ,45 to 90 minutes PT Frequency: 5 out of 7 days PT Duration Estimated Length of Stay: ~ 2 weeks PT Treatment/Interventions: Ambulation/gait training;Community reintegration;DME/adaptive equipment instruction;Neuromuscular re-education;Psychosocial support;Stair training;Wheelchair propulsion/positioning;UE/LE Strength taining/ROM;Balance/vestibular training;Discharge planning;Functional electrical stimulation;Pain management;Skin care/wound management;Therapeutic Activities;UE/LE Coordination activities;Cognitive remediation/compensation;Disease management/prevention;Functional mobility training;Patient/family education;Splinting/orthotics;Therapeutic Exercise;Visual/perceptual remediation/compensation PT Transfers Anticipated Outcome(s): supervision using LRAD PT Locomotion Anticipated Outcome(s): supervision using LRAD PT Recommendation Follow Up Recommendations: Home health PT;24 hour supervision/assistance Patient destination: Home Equipment Recommended: To be determined;Rolling walker with 5" wheels   PT Evaluation Precautions/Restrictions Precautions Precautions: Fall;Other (comment) Precaution Comments: R upper quadrant drain Restrictions Weight Bearing Restrictions:  No Pain Pain Assessment Pain Scale: 0-10 Pain Score: 0-No pain Pain Interference Pain Interference Pain Effect on Sleep: 8. Unable to answer (pt unable to recall due to impaired memory but per pt's spouse pt has had back and R upper quadrant abdominal pain due to surgery) Pain Interference with Therapy Activities: 8. Unable to answer Pain Interference with Day-to-Day Activities: 8. Unable to answer Home Living/Prior Functioning Home Living Living Arrangements: Spouse/significant other Available Help at Discharge: Family;Available 24 hours/day;Other (Comment) (pt's wife has until end of November off from work and then will use other time-off to provide pt 24hr support; Corene Cornea, bro, friend, Corene Cornea, & pt's BIL) Type of Home: House Home Access: Stairs to enter [employer] of Steps: 2 Entrance Stairs-Rails: None Home Layout: Able to live on main level with bedroom/bathroom;Laundry or work area in basement;One level Alternate Level Stairs-Number of Steps: chair lift down to basement; pt and wife live in his father's old house that was set up for the father. Have grab bars in the bathroom and built in shower seat.  Lives With: Spouse Prior Function Level of Independence: Independent with gait;Independent with transfers;Independent with homemaking with ambulation (prior to current hospitalization that started on 8/31)  Able to Take Stairs?: Yes Driving: Yes Vocation: Retired Vision/Perception  Vision - History Ability to See in Adequate Light: 2 Moderately impaired Perception Perception: Impaired Inattention/Neglect: Does not attend to left side of body;Does not attend to left visual field Praxis Praxis: Impaired Praxis Impairment Details: Motor planning  Cognition Overall Cognitive Status: Impaired/Different from baseline Arousal/Alertness: Awake/alert Orientation Level: Oriented to person;Disoriented to situation;Other (comment);Disoriented to time (when asked what type of  bed he was in he could identify it as a hospital bed then able to say he is in the hospital but otherwise pt thinks he is in "[place]: 2023 (if therapist starts to say it pt can finish) Month:  (unable despite question cuing) Attention: Focused;Sustained;Selective Focused Attention: Impaired Sustained Attention: Impaired Selective Attention: Impaired Memory: Impaired Awareness: Impaired Problem Solving: Impaired Behaviors: Perseveration (  noted while brushing teeth) Safety/Judgment: Impaired Sensation Sensation Light Touch: Appears Intact (difficult to truly assess due to impaired cognitiion impacting ability to following commands but appears intact) Hot/Cold: Not tested Proprioception: Impaired by gross assessment Stereognosis: Not tested Coordination Gross Motor Movements are Fluid and Coordinated: No Coordination and Movement Description: functional strength deficits with decreased coordination of LE movements during gait Motor  Motor Motor: Other (comment);Abnormal postural alignment and control;Motor perseverations Motor - Skilled Clinical Observations: generalized weakness and deconditioning   Trunk/Postural Assessment  Cervical Assessment Cervical Assessment: Within Functional Limits Thoracic Assessment Thoracic Assessment: Exceptions to [facility] (mild rounded shoulders) Lumbar Assessment Lumbar Assessment: Exceptions to [facility] (posterior pelvic tilt) Postural Control Postural Control: Deficits on evaluation Postural Limitations: decreased with pt relying on B UE support on RW to improve balance  Balance Balance Balance Assessed: Yes Static Sitting Balance Static Sitting - Balance Support: Feet supported Static Sitting - Level of Assistance: 5: Stand by assistance Dynamic Sitting Balance Dynamic Sitting - Balance Support: Feet supported Dynamic Sitting - Level of Assistance: Other (comment) (CGA) Static Standing Balance Static Standing - Balance Support:  During functional activity;Bilateral upper extremity supported Static Standing - Level of Assistance: Other (comment);4: Min assist (CGA) Dynamic Standing Balance Dynamic Standing - Balance Support: During functional activity;Bilateral upper extremity supported Dynamic Standing - Level of Assistance: 4: Min assist;3: Mod assist Extremity Assessment      RLE Assessment RLE Assessment: Exceptions to [facility] General Strength Comments: attempted to assess in sitting but pt with difficulty following commands to apply resistance (able to move against gravity in all joints) - demos at least 4/5 functionally LLE Assessment LLE Assessment: Exceptions to [facility] General Strength Comments: attempted to assess in sitting but pt with difficulty following commands to apply resistance (able to move against gravity in all joints) - demos at least 4/5 functionally  Care Tool Care Tool Bed Mobility Roll left and right activity   Roll left and right assist level: Minimal Assistance - Patient > 75%    Sit to lying activity   Sit to lying assist level: Minimal Assistance - Patient > 75%    Lying to sitting on side of bed activity   Lying to sitting on side of bed assist level: the ability to move from lying on the back to sitting on the side of the bed with no back support.: Minimal Assistance - Patient > 75%     Care Tool Transfers Sit to stand transfer   Sit to stand assist level: Minimal Assistance - Patient > 75% Sit to stand assistive device: Walker;Armrests  Chair/bed transfer   Chair/bed transfer assist level: Minimal Assistance - Patient > 75% Chair/bed transfer assistive device: Armrests;Walker   Physiological scientist transfer assist level: Moderate Assistance - Patient 50 - 74% Health visitor Comment: RW    Care Tool Locomotion Ambulation   Assist level: Minimal Assistance - Patient > 75% Assistive device: Walker-rolling Max distance: 177f  Walk 10 feet  activity   Assist level: Minimal Assistance - Patient > 75% Assistive device: Walker-rolling   Walk 50 feet with 2 turns activity   Assist level: Minimal Assistance - Patient > 75% Assistive device: Walker-rolling  Walk 150 feet activity Walk 150 feet activity did not occur: Safety/medical concerns      Walk 10 feet on uneven surfaces activity   Assist level: Minimal Assistance - Patient > 75% Assistive device: Walker-rolling  Stairs  Assist level: Minimal Assistance - Patient > 75% Stairs assistive device: 2 hand rails Max number of stairs: 4  Walk up/down 1 step activity   Walk up/down 1 step (curb) assist level: Minimal Assistance - Patient > 75% Walk up/down 1 step or curb assistive device: 2 hand rails  Walk up/down 4 steps activity   Walk up/down 4 steps assist level: Minimal Assistance - Patient > 75% Walk up/down 4 steps assistive device: 2 hand rails  Walk up/down 12 steps activity Walk up/down 12 steps activity did not occur: Safety/medical concerns      Pick up small objects from floor   Pick up small object from the floor assist level: Moderate Assistance - Patient 50 - 74% Pick up small object from the floor assistive device: RW  Wheelchair Is the patient using a wheelchair?: Yes Type of Wheelchair: Manual   Wheelchair assist level: Dependent - Patient 0% Max wheelchair distance: >148f  Wheel 50 feet with 2 turns activity   Assist Level: Dependent - Patient 0%  Wheel 150 feet activity   Assist Level: Dependent - Patient 0%    Refer to Care Plan for Long Term Goals  SHORT TERM GOAL WEEK 1 PT Short Term Goal 1 (Week 1): Pt will perform supine<>sit with CGA consistently PT Short Term Goal 2 (Week 1): Pt will perform sit<>stands using LRAD with CGA consistently PT Short Term Goal 3 (Week 1): Pt will perform bed<>chair transfers using LRAD with CGA PT Short Term Goal 4 (Week 1): Pt will ambulate at least 763fusing LRAD with CGA PT Short Term Goal 5 (Week 1):  Pt will navigate 2 steps using LRAD with CGA  Recommendations for other services: None   Skilled Therapeutic Intervention Pt received supine in bed with his wife, Angie, present and pt agreeable to therapy session. Pt does not appear to remember this therapist from his prior CIR admission. Evaluation completed (see details above) with patient education regarding purpose of PT evaluation, PT POC and goals, therapy schedule, weekly team meetings, and other CIR information including safety plan and fall risk safety. Pt performed the below functional mobility tasks with the specified levels of skilled cuing and assistance. Pt has slight R knee instability noted during transfers and gait; however, no true knee buckling noted. Pt continues to have significant impairments in motor planning and sequencing requiring mod/max simple, single step cuing to complete functional mobility tasks safely. At end of session, pt left seated in w/c with needs in reach and seat belt alarm on.  Mobility Bed Mobility Bed Mobility: Supine to Sit;Sit to Supine Supine to Sit: Minimal Assistance - Patient > 75% Sit to Supine: Minimal Assistance - Patient > 75% Transfers Transfers: Sit to Stand;Stand to Sit;Stand Pivot Transfers Sit to Stand: Minimal Assistance - Patient > 75% Stand to Sit: Minimal Assistance - Patient > 75% Stand Pivot Transfers: Minimal Assistance - Patient > 75% Stand Pivot Transfer Details: Verbal cues for safe use of DME/AE;Verbal cues for precautions/safety;Verbal cues for sequencing;Verbal cues for technique;Verbal cues for gait pattern;Tactile cues for initiation;Tactile cues for sequencing;Tactile cues for posture;Tactile cues for weight shifting;Tactile cues for placement;Manual facilitation for weight shifting;Manual facilitation for placement Transfer (Assistive device): Rolling walker Locomotion  Gait Ambulation: Yes Gait Assistance: Minimal Assistance - Patient > 75% Gait Distance (Feet): 120  Feet Assistive device: Rolling walker Gait Assistance Details: Verbal cues for gait pattern;Verbal cues for technique;Verbal cues for precautions/safety;Verbal cues for safe use of DME/AE;Tactile cues for sequencing;Tactile cues for posture;Tactile  cues for weight shifting;Verbal cues for sequencing;Manual facilitation for placement;Other (comment) Gait Assistance Details: manual facilitation for RW management when turning Gait Gait: Yes Gait Pattern: Impaired Gait Pattern: Step-through pattern;Decreased step length - left;Decreased step length - right;Decreased stride length;Narrow base of support;Lateral hip instability Gait velocity: decreased Stairs / Additional Locomotion Stairs: Yes Stairs Assistance: Minimal Assistance - Patient > 75% Stair Management Technique: Two rails;Forwards;Step to pattern Number of Stairs: 4 Height of Stairs: 6 Ramp: Minimal Assistance - Patient >75% (heavy min assist using RW) Wheelchair Mobility Wheelchair Mobility: No   Discharge Criteria: Patient will be discharged from PT if patient refuses treatment 3 consecutive times without medical reason, if treatment goals not met, if there is a change in medical status, if patient makes no progress towards goals or if patient is discharged from hospital.  The above assessment, treatment plan, treatment alternatives and goals were discussed and mutually agreed upon: by patient  Tawana Scale , PT, DPT, NCS, CSRS 11/13/2021, 7:46 AM

## 2021-11-13 NOTE — Progress Notes (Signed)
Physical Therapy Session Note  Patient Details  Name: Troy Randall MRN: [id number] Date of Birth: Nov 14, 1960  Today's Date: 11/13/2021 PT Individual Time: 1410-1503 PT Individual Time Calculation (min): 53 min   Short Term Goals: Week 1:  PT Short Term Goal 1 (Week 1): Pt will perform supine<>sit with CGA consistently PT Short Term Goal 2 (Week 1): Pt will perform sit<>stands using LRAD with CGA consistently PT Short Term Goal 3 (Week 1): Pt will perform bed<>chair transfers using LRAD with CGA PT Short Term Goal 4 (Week 1): Pt will ambulate at least 21ft using LRAD with CGA PT Short Term Goal 5 (Week 1): Pt will navigate 2 steps using LRAD with CGA  Skilled Therapeutic Interventions/Progress Updates:  Pt received asleep, supine in bed with his wife, Karoline Caldwell, present and pt easily awakens and agreeable to therapy session. Nurse in/out for drain management.   Supine>sitting L EOB, HOB flat but using bedrail, with min assist for trunk upright via HHA. Sitting EOB, donned tennis shoes total assist for time. R stand pivot EOB>w/c, no AD, with light min assist for balance/steadying.    Transported to/from gym in w/c for time management and energy conservation.  Patient participated in [facility] and demonstrates increased fall risk as noted by score of   7/56.  (<36= high risk for falls, close to 100%; 37-45 significant >80%; 46-51 moderate >50%; 52-55 lower >25%).  Gait training 131ft using RW with min assist for balance - pt managing AD turning through 2 doorways but requires total assist to problem solve and motor plan how to use RW to turn around and sit in w/c at end of walk (may be fatigue related as well). Pt demonstrating the following gait deviations with therapist providing the described cuing and facilitation for improvement:  - R LE mildly scissors with slight excessive hip external rotation  - R knee buckles 1x requiring heavy min assist to recover but able to continue with  gait - pt does veer towards L side of doorway but is able to correct given increased time but no cuing   Performed functional strengthening of repeated sit<>stands w/c<>RW 2sets of 5 reps with max cuing to push up from w/c armrest and reach back prior to returning to sit, pt starts to perseverate on wanting to reach towards red buttons on RW (that fold it up) requiring max cuing to remember to put hands on w/c armrests during the transition - noticed to have poor eccentric control when lowering, improves with cuing.  Gait training ~193ft back towards room using RW with CGA/light min assist and therapist providing w/c follow in event of fatigue to allow further distance - pt continues to have narrow BOS with R LE tending to scissor (worsens with fatigue) with excessive hip external rotation - no instances of knee buckling at this time, but pt does keep R knee fully extended.  Transported remainder of distance back to room in w/c. Short distance ambulatory transfer w/c>EOB using RW with CGA/light min assist - pt continues to have difficult motor planning, problem solving how to turn and sit while keeping RW with him requiring max cuing.  Sit>L sidelying with supervision. Pt left L sidelying in bed with needs in reach, bed alarm on, lines intact, and his wife present.   Therapy Documentation Precautions:  Precautions Precautions: Fall, Other (comment) Precaution Comments: R upper quadrant drain Restrictions Weight Bearing Restrictions: No   Pain: Denies pain during session.  Balance: Balance Balance Assessed: Yes Standardized  Balance Assessment Standardized Balance Assessment: Berg Balance Test (pt with significant endurance deficits with standing requiring seated rest break between almost every test item; pt also with difficulty following commands to complete certain items (ex: narrow BOS)) Berg Balance Test Sit to Stand: Needs minimal aid to stand or to stabilize Standing Unsupported:  Unable to stand 30 seconds unassisted (posterior LOB at 17 seconds, stepping back and using backs of legs against mat) Sitting with Back Unsupported but Feet Supported on Floor or Stool: Able to sit 2 minutes under supervision Stand to Sit: Uses backs of legs against chair to control descent Transfers: Needs one person to assist Standing Unsupported with Eyes Closed: Needs help to keep from falling (mild LOB at 8 seconds requiring assist to maintain upright) Standing Ubsupported with Feet Together: Needs help to attain position and unable to hold for 15 seconds From Standing, Reach Forward with Outstretched Arm: Loses balance while trying/requires external support From Standing Position, Pick up Object from Floor: Unable to try/needs assist to keep balance From Standing Position, Turn to Look Behind Over each Shoulder: Needs assist to keep from losing balance and falling Turn 360 Degrees: Needs assistance while turning Standing Unsupported, Alternately Place Feet on Step/Stool: Needs assistance to keep from falling or unable to try Standing Unsupported, One Foot in Front: Loses balance while stepping or standing Standing on One Leg: Unable to try or needs assist to prevent fall Total Score: 7    Therapy/Group: Individual Therapy  Lindzee Gouge M Vic Esco 11/13/2021, 2:26 PM

## 2021-11-14 DIAGNOSIS — R5381 Other malaise: Secondary | ICD-10-CM | POA: Diagnosis not present

## 2021-11-14 DIAGNOSIS — M545 Low back pain, unspecified: Secondary | ICD-10-CM

## 2021-11-14 DIAGNOSIS — E1169 Type 2 diabetes mellitus with other specified complication: Secondary | ICD-10-CM | POA: Diagnosis not present

## 2021-11-14 DIAGNOSIS — R7989 Other specified abnormal findings of blood chemistry: Secondary | ICD-10-CM | POA: Diagnosis not present

## 2021-11-14 DIAGNOSIS — G0400 Acute disseminated encephalitis and encephalomyelitis, unspecified: Principal | ICD-10-CM | POA: Insufficient documentation

## 2021-11-14 DIAGNOSIS — G8929 Other chronic pain: Secondary | ICD-10-CM

## 2021-11-14 DIAGNOSIS — I10 Essential (primary) hypertension: Secondary | ICD-10-CM | POA: Diagnosis not present

## 2021-11-14 LAB — GLUCOSE, CAPILLARY
Glucose-Capillary: 140 mg/dL — ABNORMAL HIGH (ref 70–99)
Glucose-Capillary: 213 mg/dL — ABNORMAL HIGH (ref 70–99)
Glucose-Capillary: 215 mg/dL — ABNORMAL HIGH (ref 70–99)
Glucose-Capillary: 251 mg/dL — ABNORMAL HIGH (ref 70–99)

## 2021-11-14 MED ORDER — INSULIN GLARGINE-YFGN 100 UNIT/ML ~~LOC~~ SOLN
13.0000 [IU] | Freq: Two times a day (BID) | SUBCUTANEOUS | Status: DC
  Administered 2021-11-14 – 2021-11-17 (×6): 13 [IU] via SUBCUTANEOUS
  Filled 2021-11-14 (×8): qty 0.13

## 2021-11-14 NOTE — Plan of Care (Signed)
  Problem: RH Cognition - SLP Goal: RH LTG Patient will demonstrate orientation with cues Description:  LTG:  Patient will demonstrate orientation to person/place/time/situation with cues (SLP)   Flowsheets (Taken 11/14/2021 1431) LTG Patient will demonstrate orientation to:  Person  Place  Time  Situation LTG: Patient will demonstrate orientation using cueing (SLP): Minimal Assistance - Patient > 75%   Problem: RH Comprehension Communication Goal: LTG Patient will comprehend basic/complex auditory (SLP) Description: LTG: Patient will comprehend basic/complex auditory information with cues (SLP). Flowsheets (Taken 11/14/2021 1431) LTG: Patient will comprehend: Basic auditory information LTG: Patient will comprehend auditory information with cueing (SLP): Minimal Assistance - Patient > 75%   Problem: RH Problem Solving Goal: LTG Patient will demonstrate problem solving for (SLP) Description: LTG:  Patient will demonstrate problem solving for basic/complex daily situations with cues  (SLP) Flowsheets (Taken 11/14/2021 1431) LTG: Patient will demonstrate problem solving for (SLP): Basic daily situations LTG Patient will demonstrate problem solving for: Minimal Assistance - Patient > 75%   Problem: RH Memory Goal: LTG Patient will use memory compensatory aids to (SLP) Description: LTG:  Patient will use memory compensatory aids to recall biographical/new, daily complex information with cues (SLP) Flowsheets (Taken 11/14/2021 1431) LTG: Patient will use memory compensatory aids to (SLP): Minimal Assistance - Patient > 75%   Problem: RH Attention Goal: LTG Patient will demonstrate this level of attention during functional activites (SLP) Description: LTG:  Patient will will demonstrate this level of attention during functional activites (SLP) Flowsheets (Taken 11/14/2021 1431) Patient will demonstrate during cognitive/linguistic activities the attention type of: Sustained LTG: Patient  will demonstrate this level of attention during cognitive/linguistic activities with assistance of (SLP): Minimal Assistance - Patient > 75% Number of minutes patient will demonstrate attention during cognitive/linguistic activities: 10   Problem: RH Awareness Goal: LTG: Patient will demonstrate awareness during functional activites type of (SLP) Description: LTG: Patient will demonstrate awareness during functional activites type of (SLP) Flowsheets (Taken 11/14/2021 1431) Patient will demonstrate during cognitive/linguistic activities awareness type of: Emergent LTG: Patient will demonstrate awareness during cognitive/linguistic activities with assistance of (SLP): Minimal Assistance - Patient > 75%

## 2021-11-14 NOTE — Evaluation (Signed)
Speech Language Pathology Assessment and Plan  Patient Details  Name: Troy Randall MRN: [id number] Date of Birth: March 09, 1960  SLP Diagnosis: Cognitive Impairments;Speech and Language deficits  Rehab Potential: Good ELOS: 3 weeks   Today's Date: 11/14/2021 SLP Individual Time: 1100-1200 SLP Individual Time Calculation (min): 60 min  Hospital Problem: Principal Problem:   Acute disseminated encephalomyelitis Active Problems:   Debility  Past Medical History:  Past Medical History:  Diagnosis Date   Anemia    Arthritis    CVA (cerebral vascular accident) ([place])    Diabetes mellitus without complication ([place])    Hyperlipidemia    Hypertension    Kidney stones    Past Surgical History:  Past Surgical History:  Procedure Laterality Date   ANKLE SURGERY     BIOPSY  09/10/2021   Procedure: BIOPSY;  Surgeon: Carol Ada, MD;  Location: [facility] ENDOSCOPY;  Service: Gastroenterology;;   BIOPSY  10/07/2021   Procedure: BIOPSY;  Surgeon: Carol Ada, MD;  Location: [facility] ENDOSCOPY;  Service: Gastroenterology;;   COLONOSCOPY N/A 10/07/2021   Procedure: COLONOSCOPY;  Surgeon: Carol Ada, MD;  Location: [place];  Service: Gastroenterology;  Laterality: N/A;   COLONOSCOPY WITH PROPOFOL N/A 09/06/2021   Procedure: COLONOSCOPY WITH PROPOFOL;  Surgeon: Harvel Quale, MD;  [facility] ENDO SU[facility];  Service: Gastroenterology;  Laterality: N/A;   ENTEROSCOPY N/A 09/10/2021   Procedure: ENTEROSCOPY;  Surgeon: Carol Ada, MD;  Location: [place];  Service: Gastroenterology;  Laterality: N/A;   ENTEROSCOPY  09/06/2021   Procedure: ENTEROSCOPY;  Surgeon: Harvel Quale, MD;  Location: [facility] END[facility];  Service: Gastroenterology;;   ESOPHAGOGASTRODUODENOSCOPY (EGD) WITH PROPOFOL  09/06/2021   Procedure: ESOPHAGOGASTRODUODENOSCOPY (EGD) WITH PROPOFOL;  Surgeon: Harvel Quale, MD;  Location: [facility];  Service: Gastroenterology;;   [place] N/A  09/19/2021   Procedure: Beryle Quant;  Surgeon: Carol Ada, MD;  Location: [place];  Service: Gastroenterology;  Laterality: N/A;   FOREIGN BODY REMOVAL  09/19/2021   Procedure: FOREIGN BODY REMOVAL;  Surgeon: Carol Ada, MD;  Location: [facility] ENDOSCOPY;  Service: Gastroenterology;;   Freda Munro CAPSULE STUDY  09/06/2021   Procedure: GIVENS CAPSULE STUDY;  Surgeon: Harvel Quale, MD;  [facility];  Service: Gastroenterology;;   HERNIA REPAIR     umbilical x1 Incisional x1   INCISIONAL HERNIA REPAIR  04/13/2011   Procedure: LAPAROSCOPIC INCISIONAL HERNIA;  Surgeon: Jamesetta So, MD;  Location: [facility] ORS;  Service: General;  Laterality: N/A;  Recurrent Laparoscopic Incisional Herniorraphy with Mesh   IR ANGIOGRAM SELECTIVE EACH ADDITIONAL VESSEL  09/09/2021   IR ANGIOGRAM VISCERAL SELECTIVE  09/07/2021   IR ANGIOGRAM VISCERAL SELECTIVE  09/06/2021   IR ANGIOGRAM VISCERAL SELECTIVE  09/06/2021   IR CHOLANGIOGRAM EXISTING TUBE  11/06/2021   IR EMBO ART  VEN HEMORR LYMPH EXTRAV  INC GUIDE ROADMAPPING  09/06/2021   IR EXCHANGE BILIARY DRAIN  10/24/2021   IR GUIDED DRAIN W CATHETER PLACEMENT  09/07/2021   IR US GUIDE BX ASP/DRAIN  09/07/2021   IR US GUIDE VASC ACCESS RIGHT  09/07/2021   IR US GUIDE VASC ACCESS RIGHT  09/06/2021   KIDNEY STONE SURGERY      Assessment / Plan / Recommendation Clinical Impression Pt with prolonged hospitalization starting 09/04/21 due to GI bleed that ultimately required intubation, trach/PEG placement (both subsequently D/Cd), and plasma exchange. Pt d/c to AIR on 10/26. Pt returned to acute level of care on 11/2 with SVT and HR sustaining in 160s. Also found  to have UTI. PMH includes: anemia, CVAx2 (most recent 3/23), DM II, HLD, HTN, and kidney stones.  While on AIR he was being treated for cognitive-linguistic and swallowing impairments. He had just recently been advanced from nectar-thick to thin liquids (10/28). Admitted back to CIR due to functional  deficits secondary to urosepsis, acute disseminated encephalomyelitis, prolonged hospitalization secondary to GI bleed and acute respiratory failure.   Pt is known to this service. Similar to performance before pt transferred back to acute care, pt continues to exhibits severe cognitive-linguistic deficits in there areas of orientation, sustained attention, problem solving, memory (immediate, short-term) and intellectual awareness. SLP assessed pt's cognitive-linguistic skills with the SLUMS evaluation. Pt scored 5/30 points (n=27) based on pt's educational history. Score supports slight improvement from initial evaluation on 10/31/21 with a score of 1/30. Pt continues to be limited by severely impaired attention and lethargy/fatigue. Comprehension of simple commands appears negatively impacted by attention, processing speed, and working memory deficits. Pt is able to verbalize basic, functional needs with clear speech. Swallow function appears functional for consumption of regular textures and thin liquids with whole pills in puree as tolerated. Pt exhibited no overt s/sx of aspiration among all tested trials today. Further follow up for swallowing does not appear indicated at this time. Skilled ST intervention is recommended to address cognitive-linguistic deficits to increase functional independence. Pt verbalized understanding and agreement with plan. Please see below for additional assessment details.    Skilled Therapeutic Interventions          CSE, SLUMS and informal assessment measures administered. Please see report for full details.   SLP Assessment  Patient will need skilled Speech Lanaguage Pathology Services during CIR admission    Recommendations  SLP Diet Recommendations: Age appropriate regular solids;Thin Liquid Administration via: Cup;Straw Medication Administration: Whole meds with puree Supervision: Full supervision/cueing for compensatory strategies Compensations: Slow rate;Small  sips/bites;Lingual sweep for clearance of pocketing;Monitor for anterior loss;Minimize environmental distractions Postural Changes and/or Swallow Maneuvers: Seated upright 90 degrees Oral Care Recommendations: Oral care BID Recommendations for Other Services: Neuropsych consult;Therapeutic Recreation consult Therapeutic Recreation Interventions: Pet therapy Patient destination: Home Follow up Recommendations: 24 hour supervision/assistance;Home Health SLP Equipment Recommended: None recommended by SLP    SLP Frequency 3 to 5 out of 7 days   SLP Duration  SLP Intensity  SLP Treatment/Interventions 3 weeks  Minumum of 1-2 x/day, 30 to 90 minutes  Cognitive remediation/compensation;Environmental controls;Internal/external aids;Speech/Language facilitation;Functional tasks;Patient/family education;Therapeutic Activities    Pain    Prior Functioning    SLP Evaluation Cognition Overall Cognitive Status: Impaired/Different from baseline Arousal/Alertness: Awake/alert Orientation Level: Oriented to person;Disoriented to place;Disoriented to time;Disoriented to situation Year:  (2013) Month: October Day of Week: Incorrect (one day off) Attention: Focused;Sustained;Selective Focused Attention: Impaired Focused Attention Impairment: Verbal basic Sustained Attention: Impaired Sustained Attention Impairment: Verbal basic Selective Attention: Impaired Selective Attention Impairment: Verbal basic Memory: Impaired Memory Impairment: Storage deficit;Decreased recall of new information;Decreased short term memory Decreased Short Term Memory: Verbal basic Awareness: Impaired Awareness Impairment: Intellectual impairment Problem Solving: Impaired Problem Solving Impairment: Verbal basic;Functional basic Executive Function:  (All impaired) Behaviors: Restless Safety/Judgment: Impaired  Comprehension Auditory Comprehension Overall Auditory Comprehension: Impaired Basic Biographical  Questions: 76-100% accurate Basic Immediate Environment Questions: 75-100% accurate Commands: Impaired One Step Basic Commands: 50-74% accurate Two Step Basic Commands: 25-49% accurate Conversation: Simple Interfering Components: Attention;Pain;Processing speed;Working memory EffectiveTechniques: Conservation officer, nature: Not tested Reading Comprehension Reading Status: Impaired Word level: Within functional limits Sentence Level: Impaired Interfering  Components: Attention;Eye glasses not available Effective Techniques: Large print Expression Expression Primary Mode of Expression: Verbal Verbal Expression Overall Verbal Expression: Appears within functional limits for tasks assessed Oral Motor Oral Motor/Sensory Function Overall Oral Motor/Sensory Function: Within functional limits Motor Speech Overall Motor Speech: Appears within functional limits for tasks assessed Intelligibility: Intelligible  Care Tool Care Tool Cognition Ability to hear (with hearing aid or hearing appliances if normally used Ability to hear (with hearing aid or hearing appliances if normally used): 0. Adequate - no difficulty in normal conservation, social interaction, listening to TV   Expression of Ideas and Wants Expression of Ideas and Wants: 3. Some difficulty - exhibits some difficulty with expressing needs and ideas (e.g, some words or finishing thoughts) or speech is not clear   Understanding Verbal and Non-Verbal Content Understanding Verbal and Non-Verbal Content: 2. Sometimes understands - understands only basic conversations or simple, direct phrases. Frequently requires cues to understand  Memory/Recall Ability Memory/Recall Ability : That he or she is in a hospital/hospital unit   PMSV Assessment  PMSV Trial Intelligibility: Intelligible  Bedside Swallowing Assessment General Date of Onset: 09/04/21 Previous Swallow Assessment:  10/13 FEES and participation in dysphagia tx while on AIR Diet Prior to this Study: Dysphagia 3 (soft);Thin liquids Temperature Spikes Noted: No Respiratory Status: Room air Behavior/Cognition: Alert;Cooperative;Pleasant mood Oral Cavity - Dentition: Adequate natural dentition Vision: Functional for self-feeding Patient Positioning: Upright in bed Baseline Vocal Quality: Normal Volitional Cough: Strong Volitional Swallow: Able to elicit  Oral Care Assessment Oral Assessment  (WDL): Within Defined Limits Lips: Pink Teeth: Intact Tongue: Pink;Moist Mucous Membrane(s): Moist;Pink Saliva: Moist, saliva free flowing Level of Consciousness: Alert Is patient on any of following O2 devices?: None of the above Nutritional status: No high risk factors Oral Assessment Risk : Low Risk Ice Chips Ice chips: Not tested Thin Liquid Thin Liquid: Within functional limits Presentation: Straw Nectar Thick Nectar Thick Liquid: Not tested Honey Thick Honey Thick Liquid: Not tested Puree Puree: Within functional limits Solid Solid: Impaired Oral Phase Impairments: Impaired mastication BSE Assessment Risk for Aspiration Impact on safety and function: No limitations Other Related Risk Factors: Cognitive impairment  Short Term Goals: Week 1: SLP Short Term Goal 1 (Week 1): Pt will orient x4 with mod A verbal/visual cues with use of external aids SLP Short Term Goal 2 (Week 1): Pt will sustain attention to functional tasks for 5 minute intervals with mod A verbal/visual redirection cues SLP Short Term Goal 3 (Week 1): Patient will complete basic, famliar tasks with mod A verbal/visual cues for problem solving skills SLP Short Term Goal 4 (Week 1): Patient will follow 2-step commands with max A verbal/visual cues to achieve 75% accuracy SLP Short Term Goal 5 (Week 1): Pt will complete 3-4 step sequencing tasks with mod A verbal cues  Refer to Care Plan for Long Term Goals  Recommendations for  other services: Therapeutic Recreation  Pet therapy  Discharge Criteria: Patient will be discharged from SLP if patient refuses treatment 3 consecutive times without medical reason, if treatment goals not met, if there is a change in medical status, if patient makes no progress towards goals or if patient is discharged from hospital.  The above assessment, treatment plan, treatment alternatives and goals were discussed and mutually agreed upon: by patient  Patty Sermons 11/14/2021, 2:26 PM

## 2021-11-14 NOTE — Progress Notes (Signed)
PROGRESS NOTE   Subjective/Complaints: Reports he gets uncomfortable sitting in chair for a long time. He is working with therapy. No additional concerns or complaints.   Review of Systems  Constitutional:  Negative for chills and fever.  HENT:  Negative for congestion.   Respiratory:  Negative for shortness of breath.   Cardiovascular:  Negative for palpitations.  Gastrointestinal:  Negative for abdominal pain, constipation, diarrhea, nausea and vomiting.  Genitourinary: Negative.   Musculoskeletal:  Positive for back pain.  Neurological:  Negative for weakness.  Psychiatric/Behavioral:  Positive for memory loss.     Objective:   No results found. Recent Labs    11/13/21 0703  WBC 7.1  HGB 11.1*  HCT 35.3*  PLT 210    Recent Labs    11/13/21 0703  NA 139  K 3.7  CL 105  CO2 26  GLUCOSE 129*  BUN 6  CREATININE 0.65  CALCIUM 9.1     Intake/Output Summary (Last 24 hours) at 11/14/2021 1235 Last data filed at 11/14/2021 0817 Gross per 24 hour  Intake 872 ml  Output 425 ml  Net 447 ml         Physical Exam: Vital Signs Blood pressure 115/62, pulse 73, temperature 97.8 F (36.6 C), temperature source Oral, resp. rate 14, height 6' (1.829 m), weight 116.2 kg, SpO2 97 %.   General:  No apparent distress HEENT: Head is normocephalic, atraumatic, PERRLA, EOMI, sclera anicteric, oral mucosa pink and moist Neck: Supple without JVD or lymphadenopathy Heart: Reg rate and rhythm. No murmurs rubs or gallops Chest: CTA bilaterally without wheezes, rales, or rhonchi; no distress Abdomen: Soft, non-tender, non-distended, bowel sounds positive. Clear bilious drainage in bile bag Extremities: No clubbing, cyanosis, or edema. Pulses are 2+ Psych: Pt's affect is appropriate. Pt is cooperative Skin: Clean and intact without signs of breakdown GU: foley with yellow urine Neuro: Alert to person and birth date,  thinks we are in [place], says "I dont know" to date,month,year, follows simple commands Sensation intact to LT in all 4 extremities Musculoskeletal: Strength 4+/5 throughout  No joint swelling noted    Assessment/Plan: 1. Functional deficits which require 3+ hours per day of interdisciplinary therapy in a comprehensive inpatient rehab setting. Physiatrist is providing close team supervision and 24 hour management of active medical problems listed below. Physiatrist and rehab team continue to assess barriers to discharge/monitor patient progress toward functional and medical goals  Care Tool:  Bathing    Body parts bathed by patient: Chest, Face, Left arm, Right arm, Front perineal area, Right upper leg, Left upper leg   Body parts bathed by helper: Buttocks, Right lower leg, Left lower leg, Abdomen     Bathing assist Assist Level: Moderate Assistance - Patient 50 - 74%     Upper Body Dressing/Undressing Upper body dressing   What is the patient wearing?: Pull over shirt    Upper body assist Assist Level: Minimal Assistance - Patient > 75%    Lower Body Dressing/Undressing Lower body dressing      What is the patient wearing?: Incontinence brief, Pants     Lower body assist Assist for lower body dressing: Moderate Assistance -  Patient 50 - 74%     Toileting Toileting    Toileting assist Assist for toileting: Maximal Assistance - Patient 25 - 49%     Transfers Chair/bed transfer  Transfers assist     Chair/bed transfer assist level: Minimal Assistance - Patient > 75% Chair/bed transfer assistive device: Armrests, Programmer, multimedia   Ambulation assist      Assist level: Minimal Assistance - Patient > 75% Assistive device: Walker-rolling Max distance: 11ft   Walk 10 feet activity   Assist     Assist level: Minimal Assistance - Patient > 75% Assistive device: Walker-rolling   Walk 50 feet activity   Assist    Assist level:  Minimal Assistance - Patient > 75% Assistive device: Walker-rolling    Walk 150 feet activity   Assist Walk 150 feet activity did not occur: Safety/medical concerns         Walk 10 feet on uneven surface  activity   Assist     Assist level: Minimal Assistance - Patient > 75% Assistive device: Chemical engineer     Assist Is the patient using a wheelchair?: Yes Type of Wheelchair: Manual    Wheelchair assist level: Dependent - Patient 0% Max wheelchair distance: >158ft    Wheelchair 50 feet with 2 turns activity    Assist        Assist Level: Dependent - Patient 0%   Wheelchair 150 feet activity     Assist      Assist Level: Dependent - Patient 0%   Blood pressure 115/62, pulse 73, temperature 97.8 F (36.6 C), temperature source Oral, resp. rate 14, height 6' (1.829 m), weight 116.2 kg, SpO2 97 %.   Medical Problem List and Plan: 1. 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 acute respiratory failure, cholecystitis, and urosepsis.             -patient may shower             -ELOS/Goals: 10-14 days S             -Continue CIR with PT/OT, order SLP 2.  Antithrombotics: -DVT/anticoagulation:  Pharmaceutical: Lovenox (start 40 mg daily)             -antiplatelet therapy: none 3. Pain Management: Has chronic back and shoulder pain.   -Continue tylenol and robaxin PRN 4. Mood/Behavior/Sleep: LCSW to evaluate and provide emotional support                        -antipsychotic agents: n/a             -continue Atarax 10 mg TID prn anxiety 5. Neuropsych/cognition: This patient is not capable of making decisions on his own behalf. 6. Skin/Wound Care: Routine skin care checks             -dysphagia 3/thin liquids; carb modified  -11/9 Consult for SLP  7. Fluids/Electrolytes/Nutrition: Routine Is and Os  and follow-up chemistries 8: SVT: converted to SR, continue metoprolol 25 mg BID 9: Sepsis sue to GN UTI: he will continue on Augmentin through 11/11 10: Cholecystitis s/p cholecystostomy tube placement; no current indication for cholecystectomy -follow-up with GS as outpatient -routine drain site care and record output daily 11: Elevated LFTs: trending downward; follow-up CMP tomorrow/weekly. 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 Elevated LFTs  -11/9 LFT improved today   -Will recheck Monday    LOS: 2 days A FACE TO FACE EVALUATION WAS PERFORMED  Fanny Dance 11/14/2021, 12:35 PM

## 2021-11-14 NOTE — Progress Notes (Signed)
Occupational Therapy Session Note  Patient Details  Name: Troy Randall MRN: [id number] Date of Birth: 1960-05-14  Today's Date: 11/14/2021 OT Individual Time: 1415-1530 OT Individual Time Calculation (min): 75 min    Short Term Goals: Week 1:  OT Short Term Goal 1 (Week 1): Pt will complete UB dressing with supervision OT Short Term Goal 2 (Week 1): Pt will complete LB dressing with min A OT Short Term Goal 3 (Week 1): Pt will completed U/LB bathing wiht min A OT Short Term Goal 4 (Week 1): Pt will complete toileting with min A  Skilled Therapeutic Interventions/Progress Updates:     Pt received sleeping in bed easy to wake with wife and family present. Pt receptive to skilled OT session. Pt reporting his bottom was sore during session unable to tolerate sitting on mat table. Seated activities completed from wc instead to prevent pain.  Pt supine>sitting EOB min A with moderate cues HOB elevated provided for body positioning and technique. Pt sit>stand CGA RW. Pt ambulated to wc with RW requiring mod multimodal cues to manage walker and find wc. Pt initially walked pasted wc when it was positioned on his right side and walked to the trash can vs wc. Pt provided cues to scan room for wc with pt requiring mod cues to identify it. Pt transported to therapy gym total A in wc.  OT session focused on increasing attention to L visual field, transfer training, activity tolerance, and attention/awareness.  Pt completed catch/throw game seated in wc. Pt initially unable to motor planning throwing ball kicking it each time instead. Pt given demonstration and moderate multimodal cues with Pt learning task and participating in game with minimal cues to increase attention to L side and for body alignment.  Pt completed corn hole and horse shoe games in standing to increased activity tolerance and attention to L side. Pt completed 6 sit>stands from wc during activities with min verbal cues required for  hand positioning.  Pt folded towels standing at elevated table with CGA. Pt requested to play tic-tac-toe and played game x3 while standing at elevated table with 1 minute rest break provided between each game. Pt requiring moderate verbal cues to initiate turn and to attend to game.  Pt demonstrating severe apraxia and motor planning challenges during session today. Pt benefits from completing routine tasks and familiar activities. Pt was transported back to his room in wc total A. Pt wc>supine CGA. Pt was left resting in his bed with bed alarm on, call bell in reach, and wife present.   Therapy Documentation Precautions:  Precautions Precautions: Fall Precaution Comments: R upper quadrant drain Restrictions Weight Bearing Restrictions: No Other Position/Activity Restrictions: pt unable to sit on bottom for extended periods of time due to pain (even on geomat) General:   Vital Signs: Therapy Vitals Temp: 97.8 F (36.6 C) Temp Source: Oral Pulse Rate: 63 Resp: 16 BP: (Abnormal) 101/53 Patient Position (if appropriate): Lying Oxygen Therapy SpO2: 97 % O2 Device: Room Air   ADL: ADL Eating: Minimal assistance Grooming: Minimal assistance, Maximal cueing Where Assessed-Grooming: Edge of bed Upper Body Bathing: Maximal cueing, Minimal assistance Where Assessed-Upper Body Bathing: Shower Lower Body Bathing: Maximal cueing, Moderate assistance Where Assessed-Lower Body Bathing: Shower Upper Body Dressing: Maximal cueing, Minimal assistance Where Assessed-Upper Body Dressing: Edge of bed Lower Body Dressing: Maximal cueing, Moderate assistance Where Assessed-Lower Body Dressing: Edge of bed Toileting: Dependent Where Assessed-Toileting: Bed level Toilet Transfer: Minimal assistance Science writer: Sport and exercise psychologist  Transfer: Maximal cueing, Minimal assistance Film/video editor Method: Designer, industrial/product: Transfer tub  bench   Therapy/Group: Individual Therapy  Army Fossa 11/14/2021, 3:01 PM

## 2021-11-14 NOTE — Progress Notes (Signed)
Physical Therapy Session Note  Patient Details  Name: Troy Randall MRN: [id number] Date of Birth: 09/09/1960  Today's Date: 11/14/2021 PT Individual Time: 0904-1004 PT Individual Time Calculation (min): 60 min   Short Term Goals: Week 1:  PT Short Term Goal 1 (Week 1): Pt will perform supine<>sit with CGA consistently PT Short Term Goal 2 (Week 1): Pt will perform sit<>stands using LRAD with CGA consistently PT Short Term Goal 3 (Week 1): Pt will perform bed<>chair transfers using LRAD with CGA PT Short Term Goal 4 (Week 1): Pt will ambulate at least 75ft using LRAD with CGA PT Short Term Goal 5 (Week 1): Pt will navigate 2 steps using LRAD with CGA  Skilled Therapeutic Interventions/Progress Updates:     Pt greeted supine in bed and agreeable to therapy. Nursing in/out administering medication and wife present. Pt reporting no pain.   Pt disoriented to time and place today and SPT reminded of month, day, year, and location.   When first attempted sup>sit pt began getting up on inappropriate side of the bed and needed max cuing verbally, visually, and tactically for correction. Supine>sit with HOB elevated with min assist for lifting trunk. Pt using R UE on bed railing.  Upon sitting EOB pt is able to maintain sitting balance with close SPV while SPT donned shoes with total assist for time management.  Stand pivot transfer to the R performed with bed elevated and RW, light min assist.   Transported pt down to therapy gym via WC for time management.   Sit<>stand from WC, EOM, and armless chair throughout session with light min assist for trunk management and steadiness. Pt showing good hand and foot placement and no cuing needed.   Gait training:  100ft x 2 with min assist and RW. Distance included straightaways and turning through doors, which pt navigated with moderate cuing. Slightly unsteady with good step and stride length heel strike and adequate swing through, NBOS.   Obstacles then added to challenge pts RW management while turning and motor planning to turn. Cones, stools, chairs, and bolster set up on obstacle course form. Pt unable to motor plan turning around majority of obstacles even with verbal and visual cuing. Max assist needed for RW management around obstacle, or item was just moved out of the way. Pt frequently walked right into obstacle head on and would then try to turn around it or continuing walking and move object with him. Max cues given to bring attention to obstacle.   During final walk pt was attempting to navigate around a chair when he stated he was feeling dizzy. +2 assistance to pull up a chair behind the pt. Stand>sit with CGA. Vitals taken and found to be 99/79 BP, and HR 83bpm. Allowed pt to rest and he reported dizziness going away in ~3 minutes.  Once pt stated his dizziness was gone and performed a sit>stand from chair to RW with CGA. Vitals taken in standing and found to be BP 118/99 and HR 83. Pt reporting feeling slightly dizzy again.   Transported pt back to room via WC for time management. Once back in the room pt stated he was no longer dizzy.  Stand pivot to the L with RW and light min assist from WC to EOB. Sit>L side lying with min assist for scooting. Pt stating he would like to remain in side lying. Reporting no dizziness and no pain.   Pt left L sidelying in bed with bed alarm on, call bell   in reach, all 4 railings up per wife's request, and all needs met.   Therapy Documentation Precautions:  Precautions Precautions: Fall Precaution Comments: R upper quadrant drain Restrictions Weight Bearing Restrictions: No Other Position/Activity Restrictions: pt unable to sit on bottom for extended periods of time due to pain (even on geomat)   Therapy/Group: Individual Therapy  Kayleen Memos, SPT 11/14/2021, 10:08 AM

## 2021-11-15 LAB — GLUCOSE, CAPILLARY
Glucose-Capillary: 125 mg/dL — ABNORMAL HIGH (ref 70–99)
Glucose-Capillary: 222 mg/dL — ABNORMAL HIGH (ref 70–99)
Glucose-Capillary: 177 mg/dL — ABNORMAL HIGH (ref 70–99)
Glucose-Capillary: 190 mg/dL — ABNORMAL HIGH (ref 70–99)

## 2021-11-15 NOTE — Progress Notes (Signed)
Physical Therapy Session Note  Patient Details  Name: Troy Randall MRN: [id number] Date of Birth: 1960-06-24  Today's Date: 11/15/2021 PT Individual Time: 1500-1600 PT Individual Time Calculation (min): 60 min   Short Term Goals: Week 1:  PT Short Term Goal 1 (Week 1): Pt will perform supine<>sit with CGA consistently PT Short Term Goal 2 (Week 1): Pt will perform sit<>stands using LRAD with CGA consistently PT Short Term Goal 3 (Week 1): Pt will perform bed<>chair transfers using LRAD with CGA PT Short Term Goal 4 (Week 1): Pt will ambulate at least 54ft using LRAD with CGA PT Short Term Goal 5 (Week 1): Pt will navigate 2 steps using LRAD with CGA  Skilled Therapeutic Interventions/Progress Updates:  Pt received supine in bed with wife present. He reported pain initially in abdomen, then denied it a few minutes later.  Pt reported dizziness, per wife.  Supine- BP 106/62, HR 71, O2 sats 96%.  Pt declined getting OOB, but agreed to bedside exs.  He was unable to follow simple commands for LE movements.  Wife exited the room and pt was more open to tx.   Pt agreed to manipulate bolts and nuts, sitting up EOB.  L sidelying> sitting with min assist, TCs and extra time, without rails, HOB raised.  Pt sat EOB x 10 minutes while manipulating hardware.  He had a couple of frustrated outbursts during the activity, but could be re-directed, and participated with max multimodal cues. Pt generally used R hand only, unless cued to use L as well.   Pt agreeable to gait training; no c/o dizziness.  Sit> stand with CGA to RW.  Gait training on level tile x 150' including turns, CGA.  Advanced gait training after demo, for negotiating obstacles with RW.  Pt weaved in/out of furniture, using RW, CG> min assist x 40' when he fatigued and had to sit quickly.  Stand pivot transfer to return to bed, using RW and CGA, mod cues for turning. Sit> supine with  min assist. At conclusion of session, pt resting in L  side lying with alarm set and needs at hand.  Wife present.       Therapy Documentation Precautions:  Precautions Precautions: Fall Precaution Comments: R upper quadrant drain Restrictions Weight Bearing Restrictions: No Other Position/Activity Restrictions: pt unable to sit on bottom for extended periods of time due to pain (even on geomat)         Therapy/Group: Individual Therapy  Carlon Chaloux 11/15/2021, 4:25 PM

## 2021-11-15 NOTE — Progress Notes (Signed)
Speech Language Pathology Daily Session Note  Patient Details  Name: Troy Randall MRN: [id number] Date of Birth: [birth year]  Today's Date: 11/15/2021 SLP Individual Time: 1000-1100 SLP Individual Time Calculation (min): 60 min  Short Term Goals: Week 1: SLP Short Term Goal 1 (Week 1): Pt will orient x4 with mod A verbal/visual cues with use of external aids SLP Short Term Goal 2 (Week 1): Pt will sustain attention to functional tasks for 5 minute intervals with mod A verbal/visual redirection cues SLP Short Term Goal 3 (Week 1): Patient will complete basic, famliar tasks with mod A verbal/visual cues for problem solving skills SLP Short Term Goal 4 (Week 1): Patient will follow 2-step commands with max A verbal/visual cues to achieve 75% accuracy SLP Short Term Goal 5 (Week 1): Pt will complete 3-4 step sequencing tasks with mod A verbal cues  Skilled Therapeutic Interventions: Skilled ST treatment focused on cognitive goals. Pt greeted in bed and accompanied by spouse on arrival. Pt requested to stay in bed for session due to dizziness during OT session earlier. Endorsed dizziness x1 during session attributed to repositioning in bed which resolved following ~1 minute. BP 117/76. Pt required max A verbal cues to orient to place/time given semantic and contextual cues. SLP facilitated a basic organization task by organizing large pictures of foods into two categories (fruits vs. vegetables). Pt required max A to identify fruits but exhibited more success with vegetables. Pt followed 1-step commands to place items with specific category with max fading to mod A verbal/visual cues. SLP then facilitated a concrete divergent naming task by naming up to 3 items within concrete category (I.g., animals, sports, family members) with max A verbal cues. SLP educated spouse on cognitive-communication strategies to support familiar/personal cues (field of choices, semantic cues, first letter cues). Spouse  verbalized and demonstrated understanding throughout task and provided gentle cueing alongside SLP. Pt sustained attention to today's tasks for 2-3 minute intervals with max A verbal redirection cues and exhibited increased fidgeting and restlessness toward end of session d/t discomfort on buttocks. Repositioning was mildly effective.  Patient was left in bed with alarm activated and immediate needs within reach at end of session. Continue per current plan of care.      Pain  Generalized discomfort. SLP facilitated repositioning throughout.   Therapy/Group: Individual Therapy  Jenin Birdsall T Sherin Murdoch 11/15/2021, 10:59 AM

## 2021-11-15 NOTE — IPOC Note (Signed)
Overall Plan of Care [facility]) Patient Details Name: Troy Randall MRN: [id number] DOB: 23-Apr-1960  Admitting Diagnosis: Acute disseminated encephalomyelitis  Hospital Problems: Principal Problem:   Acute disseminated encephalomyelitis Active Problems:   Debility     Functional Problem List: Nursing Bladder, Safety, Bowel, Edema, Sensory, Skin Integrity, Endurance, Medication Management, Motor, Pain  PT Balance, Pain, Behavior, Perception, Edema, Safety, Sensory, Endurance, Motor, Skin Integrity, Nutrition  OT Balance, Endurance, Perception, Motor, Safety, Cognition  SLP Cognition, Linguistic  TR         Basic ADL's: OT Grooming, Bathing, Dressing, Toileting     Advanced  ADL's: OT       Transfers: PT Bed Mobility, Bed to Chair, Car, [employer], Civil Service fast streamer, Research scientist (life sciences): PT Ambulation, Stairs     Additional Impairments: OT    SLP Communication, Social Cognition comprehension Problem Solving, Memory, Awareness, Attention, Social Interaction  TR      Anticipated Outcomes Item Anticipated Outcome  Self Feeding Mod I  Swallowing      Basic self-care  supervision  Toileting  supervision   Bathroom Transfers supervision  Bowel/Bladder  continent x 2  Transfers  supervision using LRAD  Locomotion  supervision using LRAD  Communication  min A  Cognition  min-to-mod A  Pain  less than 3  Safety/Judgment  remain free of falls while in rehab   Therapy Plan: PT Intensity: Minimum of 1-2 x/day ,45 to 90 minutes PT Frequency: 5 out of 7 days PT Duration Estimated Length of Stay: ~ 2 weeks OT Intensity: Minimum of 1-2 x/day, 45 to 90 minutes OT Frequency: 5 out of 7 days OT Duration/Estimated Length of Stay: 3 weeks SLP Intensity: Minumum of 1-2 x/day, 30 to 90 minutes SLP Frequency: 3 to 5 out of 7 days SLP Duration/Estimated Length of Stay: 3 weeks   Team Interventions: Nursing Interventions Patient/Family Education, Pain Management,  Bladder Management, Medication Management, Discharge Planning, Bowel Management, Skin Care/Wound Management, Psychosocial Support, Disease Management/Prevention  PT interventions Ambulation/gait training, Community reintegration, DME/adaptive equipment instruction, Neuromuscular re-education, Psychosocial support, Stair training, Wheelchair propulsion/positioning, UE/LE Strength taining/ROM, Warden/ranger, Discharge planning, Functional electrical stimulation, Pain management, Skin care/wound management, Therapeutic Activities, UE/LE Coordination activities, Cognitive remediation/compensation, Disease management/prevention, Functional mobility training, Patient/family education, Splinting/orthotics, Therapeutic Exercise, Visual/perceptual remediation/compensation  OT Interventions Balance/vestibular training, Cognitive remediation/compensation, DME/adaptive equipment instruction, Discharge planning, Functional mobility training, Neuromuscular re-education, Psychosocial support, Patient/family education, Self Care/advanced ADL retraining, UE/LE Strength taining/ROM, Therapeutic Exercise, Therapeutic Activities, UE/LE Coordination activities, Visual/perceptual remediation/compensation, Community reintegration, Wheelchair propulsion/positioning, Pain management, Skin care/wound managment  SLP Interventions Cognitive remediation/compensation, Environmental controls, Internal/external aids, Speech/Language facilitation, Functional tasks, Patient/family education, Therapeutic Activities  TR Interventions    SW/CM Interventions Discharge Planning, Psychosocial Support, Patient/Family Education, Disease Management/Prevention   Barriers to Discharge MD  Medical stability, Home enviroment access/loayout, and Behavior  Nursing Decreased caregiver support, Home environment access/layout house with B/B on main level with 2 ste and rail on left  PT Incontinence    OT      SLP      SW Insurance for  SNF coverage, Decreased caregiver support, Lack of/limited family support, Inaccessible home environment     Team Discharge Planning: Destination: PT-Home ,OT- Home , SLP-Home Projected Follow-up: PT-Home health PT, 24 hour supervision/assistance, OT-  Home health OT, SLP-24 hour supervision/assistance, Home Health SLP Projected Equipment Needs: PT-To be determined, Rolling walker with 5" wheels, OT- 3 in 1 bedside comode, Tub/shower bench, SLP-None recommended by SLP Equipment  Details: PT- , OT-bariatric bedside comode, hospital bed, baritric shower chair Patient/family involved in discharge planning: PT- Patient, Family member/caregiver,  OT-Patient, Family member/caregiver, SLP-Patient  MD ELOS: 10-14 Medical Rehab Prognosis:  Excellent Assessment: The patient has been admitted for CIR therapies with the diagnosis of acute disseminated encephalomyelitis, demyelinating disease in brain, C spine and T spine, debility, prolonged hospitalization secondary to GI bleed. acute respiratory failure, cholecystitis, and urosepsis. . The team will be addressing functional mobility, strength, stamina, balance, safety, adaptive techniques and equipment, self-care, bowel and bladder mgt, patient and caregiver education. Goals have been set at Supervision PT/OT, Mod I ST. Anticipated discharge destination is home.        See Team Conference Notes for weekly updates to the plan of care

## 2021-11-15 NOTE — Progress Notes (Signed)
Occupational Therapy Session Note  Patient Details  Name: Troy Randall MRN: [id number] Date of Birth: 1960-08-05  Today's Date: 11/15/2021 OT Individual Time: 2585-2778 OT Individual Time Calculation (min): 72 min    Short Term Goals: Week 1:  OT Short Term Goal 1 (Week 1): Pt will complete UB dressing with supervision OT Short Term Goal 2 (Week 1): Pt will complete LB dressing with min A OT Short Term Goal 3 (Week 1): Pt will completed U/LB bathing wiht min A OT Short Term Goal 4 (Week 1): Pt will complete toileting with min A  Skilled Therapeutic Interventions/Progress Updates:  Pt greeted supine in bed, pt agreeable to OT intervention. Session focus on BADL reeducation, functional mobility, dynamic standing balance, problem solving, visual attention/scanning, safety awareness and decreasing burden of care.   Pt greete supine in bed, supine>sit with MINA to elevate trunk, pt declined need for wash up but completed dressing EOB. Set- up for UB dressing MIN A for LB dressing to thread pants through foley and pull pants to waist line.   Pt ambulated to sink for oral care but noted to walk past it, pt needed MAX cues to locate sink on L side. Deferred task to sitting for energy conservation/safety as pt beginning to lean to L, pt needed step by step cues for teeth brushing from w/c. Pt transported to gym in wc with total A for time mgmt.   Pt instructed to sit>stand from EOM and remove certain colored squigz from mirror to challenge dynmaic standing balance, visual attention and problem solving.  pt with difficulty locating correct color but denies visual changes and when asked if it was the correct color pt able to respond appropriately.  Pt completed sit>stands with CGA with Rw, cues need to reach back to sit for safety, pt completed task with CGA overall but MAX cues for problem solving/locating correct color especially to L side.   HR 90 O2 88   Worked on functional mobility with RW  to increase overall endurance for ADL participation with pt completing ~ 10 ft x2 of functional ambulation  with CGA with RW but needed MOD cues for RW mgmt as pt often trying to push Rw away from him even though foley attached to AD, pt also with difficulty motor planning transitions such as turns or obstacles as OTA placed cones in gym with pt instructed to weave in between cones, pt with greatest difficulty problem solving how to turn to the left without running over cone.  Attempted to complete obstacle course task for a second trial with pt standing and reporitng feeling "drunk" needing to sit down.   BP 81/ 62 ( 70) HR 78   Returned to room to alert LPN, reassesed BP in room 86/64 ( 72) HR 76.  Returned pt to supine for safety with CGA.  Pt left supine in bed with bed alarm activated and all needs within reach.  Therapy Documentation Precautions:  Precautions Precautions: Fall Precaution Comments: R upper quadrant drain Restrictions Weight Bearing Restrictions: No Other Position/Activity Restrictions: pt unable to sit on bottom for extended periods of time due to pain (even on geomat)  Pain: No pain    Therapy/Group: Individual Therapy  Pollyann Glen [facility] 11/15/2021, 12:03 PM

## 2021-11-16 DIAGNOSIS — G0400 Acute disseminated encephalitis and encephalomyelitis, unspecified: Secondary | ICD-10-CM | POA: Diagnosis not present

## 2021-11-16 LAB — GLUCOSE, CAPILLARY
Glucose-Capillary: 118 mg/dL — ABNORMAL HIGH (ref 70–99)
Glucose-Capillary: 244 mg/dL — ABNORMAL HIGH (ref 70–99)
Glucose-Capillary: 131 mg/dL — ABNORMAL HIGH (ref 70–99)
Glucose-Capillary: 185 mg/dL — ABNORMAL HIGH (ref 70–99)

## 2021-11-16 MED ORDER — PANTOPRAZOLE SODIUM 40 MG PO TBEC
40.0000 mg | DELAYED_RELEASE_TABLET | Freq: Every day | ORAL | Status: DC
  Administered 2021-11-16 – 2021-11-26 (×11): 40 mg via ORAL
  Filled 2021-11-16 (×11): qty 1

## 2021-11-16 NOTE — Progress Notes (Addendum)
PROGRESS NOTE   Subjective/Complaints:  Pt c/o sternal/epigastric CP in the mornings ,no radiation,  resolves during the day , no worsening in therapy   Review of Systems  Constitutional:  Negative for chills and fever.  HENT:  Negative for congestion.   Respiratory:  Negative for shortness of breath.   Cardiovascular:  Negative for palpitations.  Gastrointestinal:  Negative for abdominal pain, constipation, diarrhea, nausea and vomiting.  Genitourinary: Negative.   Musculoskeletal:  Positive for back pain.  Neurological:  Negative for weakness.  Psychiatric/Behavioral:  Positive for memory loss.     Objective:   No results found. No results for input(s): "WBC", "HGB", "HCT", "PLT" in the last 72 hours.  No results for input(s): "NA", "K", "CL", "CO2", "GLUCOSE", "BUN", "CREATININE", "CALCIUM" in the last 72 hours.   Intake/Output Summary (Last 24 hours) at 11/16/2021 1017 Last data filed at 11/16/2021 0735 Gross per 24 hour  Intake 953 ml  Output 650 ml  Net 303 ml         Physical Exam: Vital Signs Blood pressure 139/75, pulse 64, temperature 98.7 F (37.1 C), temperature source Oral, resp. rate 20, height 6' (1.829 m), weight 116.2 kg, SpO2 95 %.   General: No acute distress Mood and affect are appropriate Heart: Regular rate and rhythm no rubs murmurs or extra sounds Lungs: Clear to auscultation, breathing unlabored, no rales or wheezes Abdomen: Positive bowel sounds, soft nontender to palpation, nondistended Extremities: No clubbing, cyanosis, or edema Skin: No evidence of breakdown, no evidence of rash   GU: foley with yellow urine Neuro: Alert to person and birth date, thinks we are in [place], says "I dont know" to date,month,year, follows simple commands Sensation intact to LT in all 4 extremities Musculoskeletal: Strength 4+/5 throughout  No joint swelling noted    Assessment/Plan: 1.  Functional deficits which require 3+ hours per day of interdisciplinary therapy in a comprehensive inpatient rehab setting. Physiatrist is providing close team supervision and 24 hour management of active medical problems listed below. Physiatrist and rehab team continue to assess barriers to discharge/monitor patient progress toward functional and medical goals  Care Tool:  Bathing    Body parts bathed by patient: Chest, Face, Left arm, Right arm, Front perineal area, Right upper leg, Left upper leg   Body parts bathed by helper: Buttocks, Right lower leg, Left lower leg, Abdomen     Bathing assist Assist Level: Moderate Assistance - Patient 50 - 74%     Upper Body Dressing/Undressing Upper body dressing   What is the patient wearing?: Pull over shirt    Upper body assist Assist Level: Set up assist    Lower Body Dressing/Undressing Lower body dressing      What is the patient wearing?: Pants     Lower body assist Assist for lower body dressing: Minimal Assistance - Patient > 75%     Toileting Toileting    Toileting assist Assist for toileting: Maximal Assistance - Patient 25 - 49%     Transfers Chair/bed transfer  Transfers assist     Chair/bed transfer assist level: Contact Guard/Touching assist Chair/bed transfer assistive device: Walker, Archivist  Ambulation assist      Assist level: Contact Guard/Touching assist Assistive device: Walker-rolling Max distance: 150   Walk 10 feet activity   Assist     Assist level: Contact Guard/Touching assist Assistive device: Walker-rolling   Walk 50 feet activity   Assist    Assist level: Contact Guard/Touching assist Assistive device: Walker-rolling    Walk 150 feet activity   Assist Walk 150 feet activity did not occur: Safety/medical concerns  Assist level: Contact Guard/Touching assist Assistive device: Walker-rolling    Walk 10 feet on uneven surface   activity   Assist     Assist level: Minimal Assistance - Patient > 75% Assistive device: Walker-rolling   Wheelchair     Assist Is the patient using a wheelchair?: Yes Type of Wheelchair: Manual    Wheelchair assist level: Dependent - Patient 0% Max wheelchair distance: >154ft    Wheelchair 50 feet with 2 turns activity    Assist        Assist Level: Dependent - Patient 0%   Wheelchair 150 feet activity     Assist      Assist Level: Dependent - Patient 0%   Blood pressure 139/75, pulse 64, temperature 98.7 F (37.1 C), temperature source Oral, resp. rate 20, height 6' (1.829 m), weight 116.2 kg, SpO2 95 %.   Medical Problem List and Plan: 1. 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 acute respiratory failure, cholecystitis, and urosepsis.             -patient may shower             -ELOS/Goals: 10-14 days S             -Continue CIR with PT/OT, order SLP 2.  Antithrombotics: -DVT/anticoagulation:  Pharmaceutical: Lovenox (start 40 mg daily)             -antiplatelet therapy: none 3. Pain Management: Has chronic back and shoulder pain.   -Continue tylenol and robaxin PRN 4. Mood/Behavior/Sleep: LCSW to evaluate and provide emotional support                        -antipsychotic agents: n/a             -continue Atarax 10 mg TID prn anxiety 5. Neuropsych/cognition: This patient is not capable of making decisions on his own behalf. 6. Skin/Wound Care: Routine skin care checks             -dysphagia 3/thin liquids; carb modified  -11/9 Consult for SLP  7. Fluids/Electrolytes/Nutrition: Routine Is and Os and follow-up chemistries 8: SVT: converted to SR, continue metoprolol 25 mg BID 9: Sepsis sue to GN UTI: he will continue on Augmentin through 11/11- afebrile has foley 10: Cholecystitis s/p cholecystostomy tube  placement; no current indication for cholecystectomy -follow-up with GS as outpatient -routine drain site care and record output daily 11: Elevated LFTs: trending downward; follow-up CMP tomorrow/weekly. Continue to hold statin 12: Urinary retention: indwelling Foley catheter 13: DM-2: CBGs q AC              -continue Semglee 12 units BID             -continue SSI  -11/9 CBGs fairly well controlled, continue Semglee 12 BID, continue to monitor  -11/10 Will increase semglee to 13 units BID CBG (last 3)  Recent Labs    11/15/21  1626 11/15/21 2041 11/16/21 0618  GLUCAP 177* 190* 118*  Fair control cont to monitor 11/12  14: Hypertension: monitor TID and prn              Vitals:   11/15/21 1954 11/16/21 0512  BP: (!) 146/83 139/75  Pulse: 63 64  Resp: 16 20  Temp: 98.3 F (36.8 C) 98.7 F (37.1 C)  SpO2:  95%  BP ok this am, mild systolic elevation yesterday pm will cont to monitor on LOpressor 25mg  BID   15: GIB: 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 Elevated LFTs  -11/9 LFT improved today   -Will recheck Monday  22.  Urinary retention , has foley , consider voiding trial next week   LOS: 4 days A FACE TO FACE EVALUATION WAS PERFORMED  Charlett Blake 11/16/2021, 10:17 AM

## 2021-11-16 NOTE — Progress Notes (Signed)
Foley patent, urine amber and cloudy. RUQ drain in place. Wife at bedside providing hands on assistance. Alfredo Martinez A

## 2021-11-16 NOTE — Progress Notes (Signed)
Physical Therapy Session Note  Patient Details  Name: Troy Randall MRN: [id number] Date of Birth: 17-Oct-1960  Today's Date: 11/16/2021 PT Individual Time: 1120-1210 PT Individual Time Calculation (min): 50 min   Short Term Goals: Week 1:  PT Short Term Goal 1 (Week 1): Pt will perform supine<>sit with CGA consistently PT Short Term Goal 2 (Week 1): Pt will perform sit<>stands using LRAD with CGA consistently PT Short Term Goal 3 (Week 1): Pt will perform bed<>chair transfers using LRAD with CGA PT Short Term Goal 4 (Week 1): Pt will ambulate at least 75ft using LRAD with CGA PT Short Term Goal 5 (Week 1): Pt will navigate 2 steps using LRAD with CGA  Skilled Therapeutic Interventions/Progress Updates:  Pt received resting in bed.  He denied pain.  Son Amada Jupiter with him.   Supine> sit without use of rails, flat bed, with mod assist to L.  Pt doffed bed socks, donned regualr socks and shoes with max assist.  Sit> stand from raised bed with CGA to RW.  Gait training with RW, CGA x 100', with wc follow by Amada Jupiter.  Advanced gait training through obstacle course of 4 items on floor.  Pt remembered this task from yesterday.  Gait through obstacle course successfully avoiding 2/4 items.  Pt very frustrated by cues from PT.  Pt eventually ambulated up to a table and stopped and needed max multimodal cues to turn around and return to wc.   Seated activity for bil UE symmetrical use and activity tolerance: holding 2# weighted bar, tapping beach ball to/from 5' distance.  Min assist to initiate.  Pt reported that he had to use toilet for BM.  Toilet transfer wc to [facility] over toilet, min assist.  Max assist to manage clothing. Pt continent of stool.  Peri care total assist by PT.  Hand cleaner given to pt as he was too exhausted to stay up in wc.  Stand pivot with RW wc> bed, min assist.  Sit> lying with supervision.  At conclusion of session, pt resting in bed with needs at hand, alarm set and Amada Jupiter in  room.  Therapy Documentation Precautions:  Precautions Precautions: Fall Precaution Comments: R upper quadrant drain Restrictions Weight Bearing Restrictions: No Other Position/Activity Restrictions: pt unable to sit on bottom for extended periods of time due to pain (even on geomat)       Therapy/Group: Individual Therapy  Jakalyn Kratky 11/16/2021, 12:34 PM

## 2021-11-16 NOTE — Progress Notes (Signed)
Occupational Therapy Session Note  Patient Details  Name: Troy Randall MRN: [id number] Date of Birth: 08/28/1960  Today's Date: 11/16/2021 OT Individual Time: 1345-1425 OT Individual Time Calculation (min): 40 min    Short Term Goals: Week 1:  OT Short Term Goal 1 (Week 1): Pt will complete UB dressing with supervision OT Short Term Goal 2 (Week 1): Pt will complete LB dressing with min A OT Short Term Goal 3 (Week 1): Pt will completed U/LB bathing wiht min A OT Short Term Goal 4 (Week 1): Pt will complete toileting with min A  Skilled Therapeutic Interventions/Progress Updates:    Pt resting in bed upon arrival with wife present. OT intervention with focus on bed mobility, sitting balance, BUE GMC, motor planning, and safety awareness. Attempted to engaged pt in BUE therex with red theraband. Pt reported BUE pain with resistance. Pt required hand-over-hand cues to return demonstrate and became noticeably frustrated. Activity modified to BUE movements on demand with improved success. Pt repeatedly attempted to use BUE when initiating mono UE tasks. Pt became tearful near the end of the session and voiced frustration. Offered pastoral staff visit and pt agreed. RN Misty Stanley notified. Pt returned to supine and assisted with scooting in bed. Pt remained in bed with wife present. Bed alarm activated.   Therapy Documentation Precautions:  Precautions Precautions: Fall Precaution Comments: R upper quadrant drain Restrictions Weight Bearing Restrictions: No Other Position/Activity Restrictions: pt unable to sit on bottom for extended periods of time due to pain (even on geomat) Pain:  Pt reports Bil shoulder pain and RUE elbow pain with activity; repositioned and emotional support  Therapy/Group: Individual Therapy  [name] 11/16/2021, 2:45 PM

## 2021-11-17 DIAGNOSIS — E1169 Type 2 diabetes mellitus with other specified complication: Secondary | ICD-10-CM | POA: Diagnosis not present

## 2021-11-17 DIAGNOSIS — R7989 Other specified abnormal findings of blood chemistry: Secondary | ICD-10-CM | POA: Diagnosis not present

## 2021-11-17 DIAGNOSIS — I10 Essential (primary) hypertension: Secondary | ICD-10-CM | POA: Diagnosis not present

## 2021-11-17 DIAGNOSIS — R5381 Other malaise: Secondary | ICD-10-CM | POA: Diagnosis not present

## 2021-11-17 LAB — CBC
HCT: 33.3 % — ABNORMAL LOW (ref 39.0–52.0)
Hemoglobin: 10.2 g/dL — ABNORMAL LOW (ref 13.0–17.0)
MCH: 27 pg (ref 26.0–34.0)
MCHC: 30.6 g/dL (ref 30.0–36.0)
MCV: 88.1 fL (ref 80.0–100.0)
Platelets: 198 10*3/uL (ref 150–400)
RBC: 3.78 MIL/uL — ABNORMAL LOW (ref 4.22–5.81)
RDW: 14.6 % (ref 11.5–15.5)
WBC: 5.4 10*3/uL (ref 4.0–10.5)
nRBC: 0 % (ref 0.0–0.2)

## 2021-11-17 LAB — COMPREHENSIVE METABOLIC PANEL
ALT: 18 U/L (ref 0–44)
AST: 14 U/L — ABNORMAL LOW (ref 15–41)
Albumin: 2.8 g/dL — ABNORMAL LOW (ref 3.5–5.0)
Alkaline Phosphatase: 97 U/L (ref 38–126)
Anion gap: 10 (ref 5–15)
BUN: 7 mg/dL (ref 6–20)
CO2: 22 mmol/L (ref 22–32)
Calcium: 8.7 mg/dL — ABNORMAL LOW (ref 8.9–10.3)
Chloride: 107 mmol/L (ref 98–111)
Creatinine, Ser: 0.68 mg/dL (ref 0.61–1.24)
GFR, Estimated: >60 mL/min (ref >60)
Glucose, Bld: 144 mg/dL — ABNORMAL HIGH (ref 70–99)
Potassium: 3.6 mmol/L (ref 3.5–5.1)
Sodium: 139 mmol/L (ref 135–145)
Total Bilirubin: 0.4 mg/dL (ref 0.3–1.2)
Total Protein: 5.9 g/dL — ABNORMAL LOW (ref 6.5–8.1)

## 2021-11-17 LAB — GLUCOSE, CAPILLARY
Glucose-Capillary: 144 mg/dL — ABNORMAL HIGH (ref 70–99)
Glucose-Capillary: 210 mg/dL — ABNORMAL HIGH (ref 70–99)
Glucose-Capillary: 174 mg/dL — ABNORMAL HIGH (ref 70–99)
Glucose-Capillary: 165 mg/dL — ABNORMAL HIGH (ref 70–99)

## 2021-11-17 MED ORDER — CHLORHEXIDINE GLUCONATE CLOTH 2 % EX PADS
6.0000 | MEDICATED_PAD | CUTANEOUS | Status: DC
  Administered 2021-11-17 – 2021-11-21 (×8): 6 via TOPICAL

## 2021-11-17 MED ORDER — INSULIN GLARGINE-YFGN 100 UNIT/ML ~~LOC~~ SOLN
14.0000 [IU] | Freq: Two times a day (BID) | SUBCUTANEOUS | Status: DC
  Administered 2021-11-17 – 2021-11-20 (×6): 14 [IU] via SUBCUTANEOUS
  Filled 2021-11-17 (×7): qty 0.14

## 2021-11-17 NOTE — Progress Notes (Signed)
   11/17/21 1200  Clinical Encounter Type  Visited With Patient;Family  Visit Type Initial  Referral From Nurse  Consult/Referral To Chaplain   Chaplain responded and provided prayer and emotional support to patient and wife Angie. Patient has been here for 11 week and hopes to go home before Thanksgiving.  Jon Gills, Resident Chaplain [phone]

## 2021-11-17 NOTE — Progress Notes (Signed)
Physical Therapy Session Note  Patient Details  Name: Troy Randall MRN: [id number] Date of Birth: [birth year]  Today's Date: 11/17/2021 PT Individual Time: 1000-1045 PT Individual Time Calculation (min): 45 min   Short Term Goals: Week 1:  PT Short Term Goal 1 (Week 1): Pt will perform supine<>sit with CGA consistently PT Short Term Goal 2 (Week 1): Pt will perform sit<>stands using LRAD with CGA consistently PT Short Term Goal 3 (Week 1): Pt will perform bed<>chair transfers using LRAD with CGA PT Short Term Goal 4 (Week 1): Pt will ambulate at least 59ft using LRAD with CGA PT Short Term Goal 5 (Week 1): Pt will navigate 2 steps using LRAD with CGA  Skilled Therapeutic Interventions/Progress Updates:     Pt received supine in bed and agrees to therapy. No complaint of pain but does report "I think I just shit my britches." Pt had, in fact, had a loose bowel movement in brief. PT provides cues for logrolling in bed and PT provides totalA for pericare. Following, pt performs supine to sit with cues for sequencing and minA for sidelying to sit. Stand step transfer to [facility] with CGA and RW, with cues for positioning. WC transport to gym for time management. PT provides challenge to pt of ambulating from [facility] to mat table, with multiple objects placed as a barrier for pt to ideally perceive and ambulate around. PT demonstrates task and then pt completes with RW and CGA, ambulating 20'. Following rest break, obstacles rearranged to force pt to take different path, and pt ambulates x120' with RW, avoiding obstacles and returning to [facility] with minimal verbal cues required to complete task in correct sequence. WC transport back to room. Left seated with alarm intact and all needs within reach.  Therapy Documentation Precautions:  Precautions Precautions: Fall Precaution Comments: R upper quadrant drain Restrictions Weight Bearing Restrictions: No Other Position/Activity Restrictions: pt unable to sit  on bottom for extended periods of time due to pain (even on geomat)    Therapy/Group: Individual Therapy  Beau Fanny, PT, DPT 11/17/2021, 4:09 PM

## 2021-11-17 NOTE — Progress Notes (Signed)
Occupational Therapy Session Note  Patient Details  Name: Troy Randall MRN: [id number] Date of Birth: May 09, 1960  Today's Date: 11/17/2021 OT Individual Time: 0800-0900 OT Individual Time Calculation (min): 60 min  OT Individual Time: 1300-1400 OT Individual Time Calculation (min): 60 min    Short Term Goals: Week 1:  OT Short Term Goal 1 (Week 1): Pt will complete UB dressing with supervision OT Short Term Goal 2 (Week 1): Pt will complete LB dressing with min A OT Short Term Goal 3 (Week 1): Pt will completed U/LB bathing wiht min A OT Short Term Goal 4 (Week 1): Pt will complete toileting with min A  Skilled Therapeutic Interventions/Progress Updates:     Session 1: Pt received in room resting in bed with wife present. Pt receptive to skilled OT session. Pt reporting soreness in bottom during beginning of session-Pt repositioned in wc to prevent increased pain. Pt politely denying shower this morning.  ADLs: Pt supine>EOB supervision with HOB elevated. Pt requiring increased time to complete BADLs. Pt donned shirt EOB with set-up assist. Pt min A to donn feet into pants. Pt able to bring pants over knees and pull pants to waist in standing with CGA. Pt donned R sock and shoe with CGA. Pt noted to begin to become frustrated when donning L sock, Max A provided to increase Pt moral and participation in session. Pt completed grooming tasks seated in wc at sink. Pt brushed teeth and washed face with set-up assist. Pt able to manipulate toothpaste and toothbrush during session today. Pt noted to perseverate on tapping toothbrush clean and on washing sink requiring moderate verbal cues to transition away from task. Therapeutic Activity: Pt transported total A in wc to ADL suite. Pt instructed to complete familiar task of sorting spoons and forks into silverware container while siting in wc at table. Pt provided with maximal multimodal cues and demonstration, but Pt unable to motor plan steps for  completing tasks. When given fork/spoon, Pt attempted to use it pick up other utensils. Pt noted to become frustrated and activity terminated to prevent increase in frustration and improve Pt feelings of success. Pt transported to therapy gym for new activity to address standing balance and decreased activity tolerance.  Pt completed bean bag throwing activity standing with RW. Bean bags placed on chair to Pt L side to increase awareness to L visual field. Pt able to reach to chair to retrieve bean bag and maintain dynamic standing balance while tossing bean bag with CGA. Pt stood for 2-3 minutes x2 during task with rest break provided x1. Pt was transported back to room total A wc. Pt was EOB>supine close supervision. Pt was left resting in bed with call bell in reach, bed alarm on, and all needs met.   Session 2: Pt received resting in wc with wife present. Pt in good spirits motivated to participate in skilled OT session. Pt reporting no pain during session, politely denying shower. Pt transported to therapy gym total A in wc.  Therapeutic Activities:  Pt participated in seated BUE coordination task using 2# weighted bar to hit ball when thrown at chest level EOM to address dynamic sitting and endurance deficits. Pt attended to task with minimal verbal cues required and tolerated activity ~5 minutes x2.  Pt completed towel folding activity sitting EOM. Pt provided multimodal cues and a demonstration of how to fold wash clothes. Pt able to replicate folding task ~40% of the time with notes improvement as Pt learned task.  Pt reported his bottom was hurting sitting EOM and transferred to wc close supervision RW.  Pt compelted BUE pinch exercises using yellow, red, and green (low to medium level resistant) clothes pins standing at elevated table CGA. Pt tolerated standing for ~4 minutes to complete task. Pt able to follow 1-step directions with min verbal cues during task. Pt completed horse shoes throwing  activity to practice sit>stands and dynamic standing balance in preparation for BADLS. Pt completed sit>stand x3 and maintained standing balance CGA to throw 6 horse shoes x3.  Pt was transported back to room in wc total A. Pt wc>supine close supervision. Pt was left resting in bed with call bell in reach, bed alarm on, all needs met, and family present.   Therapy Documentation Precautions:  Precautions Precautions: Fall Precaution Comments: R upper quadrant drain Restrictions Weight Bearing Restrictions: No Other Position/Activity Restrictions: pt unable to sit on bottom for extended periods of time due to pain (even on geomat) General:   Vital Signs: Therapy Vitals Temp: 97.8 F (36.6 C) Temp Source: Oral Pulse Rate: 65 Resp: 18 BP: 125/83 Patient Position (if appropriate): Lying Oxygen Therapy SpO2: 97 % O2 Device: Room Air Pain: Pain Assessment Pain Scale: 0-10 Pain Score: 0-No pain ADL: ADL Eating: Minimal assistance Grooming: Minimal assistance, Maximal cueing Where Assessed-Grooming: Edge of bed Upper Body Bathing: Maximal cueing, Minimal assistance Where Assessed-Upper Body Bathing: Shower Lower Body Bathing: Maximal cueing, Moderate assistance Where Assessed-Lower Body Bathing: Shower Upper Body Dressing: Maximal cueing, Minimal assistance Where Assessed-Upper Body Dressing: Edge of bed Lower Body Dressing: Maximal cueing, Moderate assistance Where Assessed-Lower Body Dressing: Edge of bed Toileting: Dependent Where Assessed-Toileting: Bed level Toilet Transfer: Minimal assistance Science writer: Geophysical data processor: Maximal cueing, Minimal assistance Social research officer, government Method: Heritage manager: Transfer tub bench   Therapy/Group: Individual Therapy  Janey Genta 11/17/2021, 8:50 AM

## 2021-11-17 NOTE — Progress Notes (Signed)
Speech Language Pathology Daily Session Note  Patient Details  Name: Troy Randall MRN: [id number] Date of Birth: 1960/03/07  Today's Date: 11/17/2021 SLP Individual Time: 1115-1200 SLP Individual Time Calculation (min): 45 min  Short Term Goals: Week 1: SLP Short Term Goal 1 (Week 1): Pt will orient x4 with mod A verbal/visual cues with use of external aids SLP Short Term Goal 2 (Week 1): Pt will sustain attention to functional tasks for 5 minute intervals with mod A verbal/visual redirection cues SLP Short Term Goal 3 (Week 1): Patient will complete basic, famliar tasks with mod A verbal/visual cues for problem solving skills SLP Short Term Goal 4 (Week 1): Patient will follow 2-step commands with max A verbal/visual cues to achieve 75% accuracy SLP Short Term Goal 5 (Week 1): Pt will complete 3-4 step sequencing tasks with mod A verbal cues  Skilled Therapeutic Interventions: Skilled ST treatment focused on cognitive goals. Pt was greeted in wheelchair on arrival and concerned that he hadn't seen his spouse in awhile. Per NT, spouse had to go home and run errands and would be back around noon. Pt still expressed concern and perseverated on this throughout session. SLP provided total A to dial spouse's cell phone although no answer. Pt agreeable to continue with SLP treatment and required occasional verbal reassurance and redirection to distract.   SLP facilitated focused attention, comprehension, and basic sequencing task via the BITS in which pt selected large print numbers in order from 1-10 and letters A-G. Pt required max-to-total A for selection of #'s and mod-to-max A for selection of letters in correct sequence. Pt was known to verbalize correct sequence however would often select the incorrect stimuli. To assess auditory comprehension and reading accuracy, SLP then asked pt to identify what number or letter was emphasized by SLP in field of 2. Pt completed this task with both #'s and  letters with mod A for attention and accuracy. It appears comprehension is significantly impacted by impaired attention and working memory deficits. SLP repeated selection of #'s as mentioned above however reduced complexity to 1-5 only. Pt completed with overall mod A. Pt most consistently focused attention to presented stimuli for ~2-3 second intervals and required max A multimodal cues to increase focus to >5 seconds.   When returned to room, pt's spouse was present and pt appeared very relieved.  Patient was left in wheelchair with alarm activated and immediate needs within reach at end of session. Continue per current plan of care.       Pain    Therapy/Group: Individual Therapy  Tamala Ser 11/17/2021, 12:54 PM

## 2021-11-17 NOTE — Progress Notes (Signed)
PROGRESS NOTE   Subjective/Complaints:  No new concerns or complaints this AM. Friends were visiting him today.   Review of Systems  Constitutional:  Negative for chills and fever.  HENT:  Negative for congestion.   Respiratory:  Negative for shortness of breath.   Cardiovascular:  Negative for chest pain and palpitations.  Gastrointestinal:  Negative for abdominal pain, constipation, diarrhea, nausea and vomiting.  Genitourinary: Negative.   Musculoskeletal:  Positive for back pain.  Neurological:  Negative for weakness and headaches.  Psychiatric/Behavioral:  Positive for memory loss.     Objective:   No results found. Recent Labs    11/17/21 0501  WBC 5.4  HGB 10.2*  HCT 33.3*  PLT 198    Recent Labs    11/17/21 0501  NA 139  K 3.6  CL 107  CO2 22  GLUCOSE 144*  BUN 7  CREATININE 0.68  CALCIUM 8.7*     Intake/Output Summary (Last 24 hours) at 11/17/2021 1659 Last data filed at 11/17/2021 1327 Gross per 24 hour  Intake 362 ml  Output 585 ml  Net -223 ml         Physical Exam: Vital Signs Blood pressure 138/87, pulse 93, temperature 97.8 F (36.6 C), resp. rate 16, height 6' (1.829 m), weight 116.2 kg, SpO2 100 %.   General: No acute distress,lying in bed Mood and affect are appropriate, pleasant Heart: Regular rate and rhythm no rubs murmurs or extra sounds Lungs: Clear to auscultation, breathing unlabored, no rales or wheezes Abdomen: normoactive bowel sounds, soft nontender to palpation, nondistended Extremities: No clubbing, cyanosis, or edema Skin: Warm, Dry No evidence of breakdown, no evidence of rash   GU: foley with yellow urine Neuro: Alert to person and birth date, thinks we are in [place], says "I dont know" to date,month,year, follows simple commands Sensation intact to LT in all 4 extremities Musculoskeletal: Strength 4+/5 throughout  No joint swelling   noted    Assessment/Plan: 1. Functional deficits which require 3+ hours per day of interdisciplinary therapy in a comprehensive inpatient rehab setting. Physiatrist is providing close team supervision and 24 hour management of active medical problems listed below. Physiatrist and rehab team continue to assess barriers to discharge/monitor patient progress toward functional and medical goals  Care Tool:  Bathing    Body parts bathed by patient: Chest, Face, Left arm, Right arm, Front perineal area, Right upper leg, Left upper leg   Body parts bathed by helper: Buttocks, Right lower leg, Left lower leg, Abdomen     Bathing assist Assist Level: Moderate Assistance - Patient 50 - 74%     Upper Body Dressing/Undressing Upper body dressing   What is the patient wearing?: Pull over shirt    Upper body assist Assist Level: Set up assist    Lower Body Dressing/Undressing Lower body dressing      What is the patient wearing?: Pants     Lower body assist Assist for lower body dressing: Minimal Assistance - Patient > 75%     Toileting Toileting    Toileting assist Assist for toileting: Maximal Assistance - Patient 25 - 49%     Transfers Chair/bed transfer  Transfers assist     Chair/bed transfer assist level: Contact Guard/Touching assist Chair/bed transfer assistive device: Programmer, multimedia   Ambulation assist      Assist level: Contact Guard/Touching assist Assistive device: Walker-rolling Max distance: 100   Walk 10 feet activity   Assist     Assist level: Contact Guard/Touching assist Assistive device: Walker-rolling   Walk 50 feet activity   Assist    Assist level: Contact Guard/Touching assist Assistive device: Walker-rolling    Walk 150 feet activity   Assist Walk 150 feet activity did not occur: Safety/medical concerns  Assist level: Contact Guard/Touching assist Assistive device: Walker-rolling    Walk 10 feet on  uneven surface  activity   Assist     Assist level: Minimal Assistance - Patient > 75% Assistive device: Chemical engineer     Assist Is the patient using a wheelchair?: Yes Type of Wheelchair: Manual    Wheelchair assist level: Dependent - Patient 0% Max wheelchair distance: >114ft    Wheelchair 50 feet with 2 turns activity    Assist        Assist Level: Dependent - Patient 0%   Wheelchair 150 feet activity     Assist      Assist Level: Dependent - Patient 0%   Blood pressure 138/87, pulse 93, temperature 97.8 F (36.6 C), resp. rate 16, height 6' (1.829 m), weight 116.2 kg, SpO2 100 %.   Medical Problem List and Plan: 1. 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 acute respiratory failure, cholecystitis, and urosepsis.             -patient may shower             -ELOS/Goals: 10-14 days S             -Continue CIR with PT/OT, order SLP 2.  Antithrombotics: -DVT/anticoagulation:  Pharmaceutical: Lovenox (start 40 mg daily)             -antiplatelet therapy: none 3. Pain Management: Has chronic back and shoulder pain.   -Continue tylenol and robaxin PRN 4. Mood/Behavior/Sleep: LCSW to evaluate and provide emotional support                        -antipsychotic agents: n/a             -continue Atarax 10 mg TID prn anxiety 5. Neuropsych/cognition: This patient is not capable of making decisions on his own behalf. 6. Skin/Wound Care: Routine skin care checks             -dysphagia 3/thin liquids; carb modified  -11/9 Consult for SLP  7. Fluids/Electrolytes/Nutrition: Routine Is and Os and follow-up chemistries 8: SVT: converted to SR, continue metoprolol 25 mg BID 9: Sepsis sue to GN UTI: he will continue on Augmentin through 11/11- afebrile has foley 10: Cholecystitis s/p cholecystostomy tube  placement; no current indication for cholecystectomy -follow-up with GS as outpatient -routine drain site care and record output daily 11: Elevated LFTs: trending downward; follow-up CMP tomorrow/weekly. Continue to hold statin 12: Urinary retention: indwelling Foley catheter 13: DM-2: CBGs q AC              -continue Semglee 12 units BID             -continue SSI  -11/9 CBGs fairly well controlled, continue Semglee 12 BID, continue  to monitor  CBG (last 3)  Recent Labs    11/16/21 2029 11/17/21 0609 11/17/21 1132  GLUCAP 185* 144* 210*   -11/13 Will increase semglee from 13u to  14u BID  14: Hypertension: monitor TID and prn              Vitals:   11/17/21 0553 11/17/21 1524  BP: 125/83 138/87  Pulse: 65 93  Resp: 18 16  Temp: 97.8 F (36.6 C) 97.8 F (36.6 C)  SpO2: 97% 100%  BP ok this am, mild systolic elevation yesterday pm will cont to monitor on LOpressor 25mg  BID  11/13 well controlled, continue to monitor 15: GIB: 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 Elevated LFTs  -11/9 LFT improved today   -11/13, resolved, change routine weekly CMP to BMP  22.  Urinary retention , has foley , consider voiding trial next week   LOS: 5 days A FACE TO FACE EVALUATION WAS PERFORMED  12/13 11/17/2021, 4:59 PM

## 2021-11-18 DIAGNOSIS — G0400 Acute disseminated encephalitis and encephalomyelitis, unspecified: Secondary | ICD-10-CM | POA: Diagnosis not present

## 2021-11-18 LAB — GLUCOSE, CAPILLARY
Glucose-Capillary: 137 mg/dL — ABNORMAL HIGH (ref 70–99)
Glucose-Capillary: 247 mg/dL — ABNORMAL HIGH (ref 70–99)
Glucose-Capillary: 208 mg/dL — ABNORMAL HIGH (ref 70–99)
Glucose-Capillary: 168 mg/dL — ABNORMAL HIGH (ref 70–99)

## 2021-11-18 MED ORDER — TAMSULOSIN HCL 0.4 MG PO CAPS
0.4000 mg | ORAL_CAPSULE | Freq: Every day | ORAL | Status: DC
  Administered 2021-11-18 – 2021-11-22 (×5): 0.4 mg via ORAL
  Filled 2021-11-18 (×5): qty 1

## 2021-11-18 NOTE — Plan of Care (Signed)
Goals downgraded due to slow progress   Problem: RH Comprehension Communication Goal: LTG Patient will comprehend basic/complex auditory (SLP) Description: LTG: Patient will comprehend basic/complex auditory information with cues (SLP). Flowsheets (Taken 11/18/2021 1627) LTG: Patient will comprehend auditory information with cueing (SLP): Moderate Assistance - Patient 50 - 74%   Problem: RH Problem Solving Goal: LTG Patient will demonstrate problem solving for (SLP) Description: LTG:  Patient will demonstrate problem solving for basic/complex daily situations with cues  (SLP) Flowsheets (Taken 11/18/2021 1627) LTG Patient will demonstrate problem solving for: Moderate Assistance - Patient 50 - 74%   Problem: RH Attention Goal: LTG Patient will demonstrate this level of attention during functional activites (SLP) Description: LTG:  Patient will will demonstrate this level of attention during functional activites (SLP) Flowsheets (Taken 11/18/2021 1627) LTG: Patient will demonstrate this level of attention during cognitive/linguistic activities with assistance of (SLP): Moderate Assistance - Patient 50 - 74% Number of minutes patient will demonstrate attention during cognitive/linguistic activities: 7   Problem: RH Awareness Goal: LTG: Patient will demonstrate awareness during functional activites type of (SLP) Description: LTG: Patient will demonstrate awareness during functional activites type of (SLP) Flowsheets (Taken 11/18/2021 1627) LTG: Patient will demonstrate awareness during cognitive/linguistic activities with assistance of (SLP): Moderate Assistance - Patient 50 - 74%

## 2021-11-18 NOTE — Progress Notes (Signed)
PROGRESS NOTE   Subjective/Complaints: Discussed voiding trial   Review of Systems  Constitutional:  Negative for chills and fever.  HENT:  Negative for congestion.   Respiratory:  Negative for shortness of breath.   Cardiovascular:  Negative for chest pain and palpitations.  Gastrointestinal:  Negative for abdominal pain, constipation, diarrhea, nausea and vomiting.  Genitourinary: Negative.   Musculoskeletal:  Positive for back pain.  Neurological:  Negative for weakness and headaches.  Psychiatric/Behavioral:  Positive for memory loss.     Objective:   No results found. Recent Labs    11/17/21 0501  WBC 5.4  HGB 10.2*  HCT 33.3*  PLT 198    Recent Labs    11/17/21 0501  NA 139  K 3.6  CL 107  CO2 22  GLUCOSE 144*  BUN 7  CREATININE 0.68  CALCIUM 8.7*     Intake/Output Summary (Last 24 hours) at 11/18/2021 0826 Last data filed at 11/18/2021 0759 Gross per 24 hour  Intake 831 ml  Output 765 ml  Net 66 ml         Physical Exam: Vital Signs Blood pressure (!) 145/91, pulse 84, temperature 97.7 F (36.5 C), temperature source Oral, resp. rate 20, height 6' (1.829 m), weight 116.2 kg, SpO2 97 %.   General: No acute distress,lying in bed Mood and affect are appropriate, pleasant Heart: Regular rate and rhythm no rubs murmurs or extra sounds Lungs: Clear to auscultation, breathing unlabored, no rales or wheezes Abdomen: normoactive bowel sounds, soft nontender to palpation, nondistended Extremities: No clubbing, cyanosis, or edema Skin: Warm, Dry No evidence of breakdown, no evidence of rash   GU: foley with yellow urine Neuro: Alert to person and birth date, thinks we are in [place], says "I dont know" to date,month,year, follows simple commands Sensation intact to LT in all 4 extremities Musculoskeletal: Strength 4+/5 throughout  No joint swelling  noted    Assessment/Plan: 1.  Functional deficits which require 3+ hours per day of interdisciplinary therapy in a comprehensive inpatient rehab setting. Physiatrist is providing close team supervision and 24 hour management of active medical problems listed below. Physiatrist and rehab team continue to assess barriers to discharge/monitor patient progress toward functional and medical goals  Care Tool:  Bathing    Body parts bathed by patient: Chest, Face, Left arm, Right arm, Front perineal area, Right upper leg, Left upper leg   Body parts bathed by helper: Buttocks, Right lower leg, Left lower leg, Abdomen     Bathing assist Assist Level: Moderate Assistance - Patient 50 - 74%     Upper Body Dressing/Undressing Upper body dressing   What is the patient wearing?: Pull over shirt    Upper body assist Assist Level: Set up assist    Lower Body Dressing/Undressing Lower body dressing      What is the patient wearing?: Pants     Lower body assist Assist for lower body dressing: Minimal Assistance - Patient > 75%     Toileting Toileting    Toileting assist Assist for toileting: Maximal Assistance - Patient 25 - 49%     Transfers Chair/bed transfer  Transfers assist  Chair/bed transfer assist level: Contact Guard/Touching assist Chair/bed transfer assistive device: Arboriculturist assist      Assist level: Contact Guard/Touching assist Assistive device: Walker-rolling Max distance: 100   Walk 10 feet activity   Assist     Assist level: Contact Guard/Touching assist Assistive device: Walker-rolling   Walk 50 feet activity   Assist    Assist level: Contact Guard/Touching assist Assistive device: Walker-rolling    Walk 150 feet activity   Assist Walk 150 feet activity did not occur: Safety/medical concerns  Assist level: Contact Guard/Touching assist Assistive device: Walker-rolling    Walk 10 feet on uneven surface   activity   Assist     Assist level: Minimal Assistance - Patient > 75% Assistive device: Development worker, international aid     Assist Is the patient using a wheelchair?: Yes Type of Wheelchair: Manual    Wheelchair assist level: Dependent - Patient 0% Max wheelchair distance: >129ft    Wheelchair 50 feet with 2 turns activity    Assist        Assist Level: Dependent - Patient 0%   Wheelchair 150 feet activity     Assist      Assist Level: Dependent - Patient 0%   Blood pressure (!) 145/91, pulse 84, temperature 97.7 F (36.5 C), temperature source Oral, resp. rate 20, height 6' (1.829 m), weight 116.2 kg, SpO2 97 %.   Medical Problem List and Plan: 1. 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 acute respiratory failure, cholecystitis, and urosepsis.             -patient may shower             -ELOS/Goals: 10-14 days S team conf in am              -Continue CIR with PT/OT, order SLP 2.  Antithrombotics: -DVT/anticoagulation:  Pharmaceutical: Lovenox (start 40 mg daily)             -antiplatelet therapy: none 3. Pain Management: Has chronic back and shoulder pain.   -Continue tylenol and robaxin PRN 4. Mood/Behavior/Sleep: LCSW to evaluate and provide emotional support                        -antipsychotic agents: n/a             -continue Atarax 10 mg TID prn anxiety 5. Neuropsych/cognition: This patient is not capable of making decisions on his own behalf. 6. Skin/Wound Care: Routine skin care checks             -dysphagia 3/thin liquids; carb modified  -11/9 Consult for SLP  7. Fluids/Electrolytes/Nutrition: Routine Is and Os and follow-up chemistries 8: SVT: converted to SR, continue metoprolol 25 mg BID 9: Sepsis sue to GN UTI: he will continue on Augmentin through 11/11- afebrile has foley 10: Cholecystitis s/p  cholecystostomy tube placement; no current indication for cholecystectomy -follow-up with GS as outpatient -routine drain site care and record output daily 11: Elevated LFTs: trending downward; follow-up CMP tomorrow/weekly. Continue to hold statin 12: Urinary retention: indwelling Foley catheter, just finished abx for UTI, start flomax tonight, voiding trial in am  13: DM-2: CBGs q AC              -continue Semglee 12 units BID             -  continue SSI  -11/9 CBGs fairly well controlled, continue Semglee 12 BID, continue to monitor  CBG (last 3)  Recent Labs    11/17/21 1706 11/17/21 2105 11/18/21 0634  GLUCAP 174* 165* 137*   -11/14, controlled  Will cont semglee 14u BID  14: Hypertension: monitor TID and prn              Vitals:   11/17/21 1948 11/18/21 0636  BP: (!) 141/90 (!) 145/91  Pulse: 99 84  Resp: 18 20  Temp: (!) 97.5 F (36.4 C) 97.7 F (36.5 C)  SpO2: 96% 97%  BP ok this am, mild systolic elevation yesterday pm will cont to monitor on Lopressor 25mg  BID  11/14 well controlled, continue to monitor 15: GIB: 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

## 2021-11-18 NOTE — Progress Notes (Addendum)
Speech Language Pathology Daily Session Note  Patient Details  Name: Troy Randall MRN: [id number] Date of Birth: 1960/01/23  Today's Date: 11/18/2021 SLP Individual Time: 5300-5110 SLP Individual Time Calculation (min): 45 min  Short Term Goals: Week 1: SLP Short Term Goal 1 (Week 1): Pt will orient x4 with mod A verbal/visual cues with use of external aids SLP Short Term Goal 2 (Week 1): Pt will sustain attention to functional tasks for 5 minute intervals with mod A verbal/visual redirection cues SLP Short Term Goal 3 (Week 1): Patient will complete basic, famliar tasks with mod A verbal/visual cues for problem solving skills SLP Short Term Goal 4 (Week 1): Patient will follow 2-step commands with max A verbal/visual cues to achieve 75% accuracy SLP Short Term Goal 5 (Week 1): Pt will complete 3-4 step sequencing tasks with mod A verbal cues  Skilled Therapeutic Interventions: Skilled ST treatment focused on cognitive-linguistic goals. Pt was greeted upright in wheelchair on arrival and appeared in good spirits following OT session where he worked on bathing today. SLP facilitated session in a quiet, low stim environment.   Pt was oriented to person, place, month and year with supervision cues. Max A verbal cues needed to identify day of week and situation.   SLP facilitated focused attention and auditory verbal comprehension skills by providing max A multimodal cues in order to attend to auditory stimuli (a single number or letter) and tap his hand whenever this stimuli was presented with time ranging from 3-5 second intervals. Pt completed successfully during 1/30 occasions. Pt would either tap his hand during every occasion or not at all.   SLP facilitated a familiar card task [name]) with max A multimodal cues in regards to focused attention, following 1-step commands, recalling task instructions using external aid, and verbal/visual reasoning.   SLP facilitated counting 1-10 with  overall 40% accuracy. Pt would continuously verbalize "one, two, three, four, six, eight, ten, twelve" and was unable to repair despite max A multimodal cues.   SLP facilitated a basic PEG design copying task with overall max A multimodal cues and extended processing time. Pt exhibited poor frustration tolerance with all tasks and benefited from consistent verbal encouragement.   Pt currently making minimal progress toward goals. SLP will likely consider goal downgrade due to minimal gains thus far.   Pt was left in wheelchair at end of session with call bell in reach and immediate needs met.   Pain  No complaints  Therapy/Group: Individual Therapy  Patty Sermons 11/18/2021, 11:17 AM

## 2021-11-18 NOTE — Progress Notes (Signed)
Occupational Therapy Session Note  Patient Details  Name: Troy Randall MRN: [id number] Date of Birth: [birth year]  Today's Date: 11/18/2021 OT Individual Time: 3664-4034 OT Individual Time Calculation (min): 45 min  OT Individual Time: 1300-1400 OT Individual Time Calculation (min): 60 min   Short Term Goals: Week 1:  OT Short Term Goal 1 (Week 1): Pt will complete UB dressing with supervision OT Short Term Goal 2 (Week 1): Pt will complete LB dressing with min A OT Short Term Goal 3 (Week 1): Pt will completed U/LB bathing wiht min A OT Short Term Goal 4 (Week 1): Pt will complete toileting with min A  Skilled Therapeutic Interventions/Progress Updates:     Session 1: Pt received in bed in good spirits and motivated to participate in skilled OT session with wife present.  Pt receptive to shower and BADL retraining focused session. Pt supine>EOB supervision with HOB elevated and verbal cues on technique to lift trunk. Pt doffed clothes sitting EOB with CGA. Pt ambulated to shower RW CGA and transferred to tub bench with min A for walker management. Pt requiring increased time to follow verbal and tactile cues dung shower. Pt required mod A to wash U/LB using forward chaining technique with OT facilitating Pt in initiating washing each body part and Pt completing task. Pt perseverating on washing hands and leg during shower with mod A to transition to next step.   Pt completed UB dressing sitting EOB with set-up assist. Pt donned pants sitting EOB with min A to weave feet into pants and to maintain standing balance in standing when pulling pants to his waste. Pt EOB>supine min A to lift legs into bed. Pt was left resting in bed with call bell in reach and all needs met.   Session 2: Pt received in bed in good spirits and willing to participate in skilled OT session. Pt supine>EOB supervision with moderate verbal cues for technique to lift trunk +time. Pt reported 0/10 pain during sessions  today. Pt transported to therapy gym total A in wc.   Pt completed standing BITs bell cancellation tasks with CGA. Pt provided mod verbal and gestural cues to complete task. Piror to [employer], Pt stated "I think I need to poop" and was quickly taken to hallway bathroom for toileting. Pt completed 3/3 toileting tasks with min A for clothing management. Pt continent of small bowel movement. Pt able to complete posterior peri-care with CGA.   Pt transported from bathroom back to therapy gym total A in wc. Pt completed standing peg board activity with color identification incorporated into task. Pt able to correctly identify 4/4 colors and place pegs into board with CGA. Pt unable to finish task d/t reporting need to use restroom again. Pt transported to be room for toileting, Pt incontinent bowel movement in brief. Pt maintained standing balance close supervision with RW while OT performed posterior peri care. Pt returned to bed following session and was left with bed alarm on, call bell in reach, wife present, and all needs met.   Therapy Documentation Precautions:  Precautions Precautions: Fall Precaution Comments: R upper quadrant drain Restrictions Weight Bearing Restrictions: No Other Position/Activity Restrictions: pt unable to sit on bottom for extended periods of time due to pain (even on geomat) General:   Vital Signs:  Pain: Pain Assessment Pain Scale: 0-10 Pain Score: 0-No pain ADL: ADL Eating: Minimal assistance Grooming: Minimal assistance, Maximal cueing Where Assessed-Grooming: Edge of bed Upper Body Bathing: Maximal cueing, Minimal assistance Where  Assessed-Upper Body Bathing: Retail buyer Bathing: Maximal cueing, Moderate assistance Where Assessed-Lower Body Bathing: Shower Upper Body Dressing: Maximal cueing, Minimal assistance Where Assessed-Upper Body Dressing: Edge of bed Lower Body Dressing: Maximal cueing, Moderate assistance Where Assessed-Lower Body  Dressing: Edge of bed Toileting: Dependent Where Assessed-Toileting: Bed level Toilet Transfer: Minimal assistance Science writer: Geophysical data processor: Maximal cueing, Minimal assistance Social research officer, government Method: Heritage manager: Radio broadcast assistant   Therapy/Group: Individual Therapy  Janey Genta 11/18/2021, 12:44 PM

## 2021-11-18 NOTE — Progress Notes (Signed)
Physical Therapy Session Note  Patient Details  Name: Troy Randall MRN: [id number] Date of Birth: 04-01-1960  Today's Date: 11/18/2021 PT Individual Time: 7989-2119 PT Individual Time Calculation (min): 55 min   Short Term Goals: Week 1:  PT Short Term Goal 1 (Week 1): Pt will perform supine<>sit with CGA consistently PT Short Term Goal 2 (Week 1): Pt will perform sit<>stands using LRAD with CGA consistently PT Short Term Goal 3 (Week 1): Pt will perform bed<>chair transfers using LRAD with CGA PT Short Term Goal 4 (Week 1): Pt will ambulate at least 62ft using LRAD with CGA PT Short Term Goal 5 (Week 1): Pt will navigate 2 steps using LRAD with CGA  Skilled Therapeutic Interventions/Progress Updates:    Pt received L sidelying in bed with his wife exiting upon therapist arrival. Pt requiring min encouragement to participate due to reporting fatigue; however, pt states "I'm not going to get better laying here."   Transitioned to sitting EOB with min assist via R HHA to bring trunk upright. R stand pivot EOB>w/c, no AD, with CGA for steadying. Transported to/from gym in w/c for time management and energy conservation.  Gait training ~70ft using RW with CGA/light min assist for steadying/balance with pt having 1x slight R knee buckling but able to recover without increased assist. Pt demonstrating the following gait deviations with therapist providing the described cuing and facilitation for improvement:  - reciprocal stepping pattern - decreased gait speed - narrow BOS with slight R LE scissoring - improving AD management with pt able to navigate through doorways  Transitioned to gait training similar pathway using RW but with 2 obstacles (chair and stool) in pt's pathway and pt able to problem solve how to navigate around those items by going to the R, only bumping into the stool on his L the 2nd time passing it. CGA/light min assist provided for steadying throughout.  Gait training  ~18ft in therapy gym, no AD, with dual-task of picking up 6 cones placed around the gym - pt not picking up any of the cones on the L side during first pass down the room - pt requires max/total cuing to collect the cones and carry them with him because he starts to have a break down in the motor plan trying to put the cones back from where he picked them up - requires heavy min assist for balance with pt having more pronounced R/L lateral sway and another few instances of minor knee buckling.  Gait training in hallway with similar task but having pt locate numbered disks in ascending order (to force L visual scanning) - ensured pt could count 1-10 sitting at rest prior to having it as part of a dual-task (required max progressed to min cuing to count) - attempted gait without AD but pt having significant fatigue and appears too overwhelmed by the challenge, requiring heavy min/light mod assist for balance and unable to safely continue with the dual-task; therefore, provided pt with RW to decrease the challenge and he completed successfully located then numbered disks with mod/max cuing and min assist for balance.  Gait training 263ft using +2 B HHA with +2 min assist for balance and 1x +2 mod assist for balance due to pt having L foot catch during swing combined with partial knee buckle - pt continues to have narrow BOS with mild scissoring and decreased gait speed.  Gait training ~119ft back to his room using RW with light min assist for balance and +2 providing  w/c follow in event of fatigue - continues with above gait deviations.  Pt continues to require cuing for motor planning how to turn with RW and sit down in seat or w/c, especially when he walks straight up to the seat of the chair.  Sit>L sidelying in bed, HOB flat but usig bedrail, supervision. Pt left L sidelying in bed with needs in reach, bed alarm on, and lines intact.   Therapy Documentation Precautions:  Precautions Precautions:  Fall Precaution Comments: R upper quadrant drain Restrictions Weight Bearing Restrictions: No Other Position/Activity Restrictions: pt unable to sit on bottom for extended periods of time due to pain (even on geomat)   Pain:  Denies pain during session but states he was having pain near drain site this AM.    Therapy/Group: Individual Therapy  Ginny Forth , PT, DPT, NCS, CSRS 11/18/2021, 3:47 PM

## 2021-11-19 ENCOUNTER — Inpatient Hospital Stay (HOSPITAL_COMMUNITY): Payer: Medicare HMO

## 2021-11-19 DIAGNOSIS — G0400 Acute disseminated encephalitis and encephalomyelitis, unspecified: Secondary | ICD-10-CM | POA: Diagnosis not present

## 2021-11-19 HISTORY — PX: IR CATHETER TUBE CHANGE: IMG717

## 2021-11-19 HISTORY — PX: IR EXCHANGE BILIARY DRAIN: IMG6046

## 2021-11-19 LAB — GLUCOSE, CAPILLARY
Glucose-Capillary: 135 mg/dL — ABNORMAL HIGH (ref 70–99)
Glucose-Capillary: 188 mg/dL — ABNORMAL HIGH (ref 70–99)
Glucose-Capillary: 220 mg/dL — ABNORMAL HIGH (ref 70–99)
Glucose-Capillary: 176 mg/dL — ABNORMAL HIGH (ref 70–99)
Glucose-Capillary: 196 mg/dL — ABNORMAL HIGH (ref 70–99)

## 2021-11-19 MED ORDER — IOHEXOL 300 MG/ML  SOLN
50.0000 mL | Freq: Once | INTRAMUSCULAR | Status: AC | PRN
  Administered 2021-11-19: 10 mL

## 2021-11-19 MED ORDER — LIDOCAINE HCL 1 % IJ SOLN
INTRAMUSCULAR | Status: AC
  Filled 2021-11-19: qty 20

## 2021-11-19 NOTE — Patient Care Conference (Signed)
Inpatient RehabilitationTeam Conference and Plan of Care Update Date: 11/19/2021   Time: 10:38 AM    Patient Name: Troy Randall      Medical Record Number: [id number]  Date of Birth: 04-07-1960 Sex: Male         Room/Bed: 4W24C/4W24C-01 Payor Info: Payor: [employer] MEDICARE / Plan: Monia P[employer] MEDICARE HMO/PPO / Product Type: *No Product type* /    Admit Date/Time:  11/12/2021  2:55 PM  Primary Diagnosis:  Acute disseminated encephalomyelitis  Hospital Problems: Principal Problem:   Acute disseminated encephalomyelitis Active Problems:   Debility    Expected Discharge Date: Expected Discharge Date: 11/26/21  Team Members Present: Physician leading conference: Dr. Claudette Laws Social Worker Present: Lavera Guise, BSW Nurse Present: Chana Bode, RN PT Present: Casimiro Needle, PT OT Present: Other (comment) Bonnell Public, OT) SLP Present: Eilene Ghazi, SLP PPS Coordinator present : Fae Pippin, SLP     Current Status/Progress Goal Weekly Team Focus  Bowel/Bladder   Pt is incontinent of bowel/bladder   Pt will gain continence of bowel/bladder   Will assess qshift and PRN    Swallow/Nutrition/ Hydration               ADL's   UB dressing set-up, LB dressing mod A, U/LB bathing min A with mod multimodal cues (perseverates on washing hands), tub/toilet transfers CGA to min A for RW management   Supervision   ADL retraining, increasing attention to L side, increasing awareness, problem solving, attention, functional mobility, activity tolerance    Mobility   min/mod assist bed mobility, CGA sit<>stand and stand pivot transfers using RW, min assist gait up to 122ft using RW (narrow BOS with slight scissoring and increased postural sway), impaired attention, motor planning, and awarness impacting safety and ability to problem solve sequencing motor tasks such as turning to sit in a chair  CGA/supervision ambulatory level  pt/family education, activity  tolerance/endurance, dynamic standing balance, dynamic gait training using LRAD, B LE functional strengthening, AD management    Communication   max A   mod A   following commands    Safety/Cognition/ Behavioral Observations  max-to-total A   mod A (goals downgraded 11/14)   focused attention, orientation, functional recall, basic problem solving    Pain   Pt has no complaints of pain   Pt will remain pain free   Will assess qshift and PRN    Skin   Pt has breakdown on bottom   Pt's bottom will heal  Will assess qshift and PRN      Discharge Planning:  Dischargining home with spouse Min A, 24/7   Team Discussion:  Per MD, suspect cognitive issues, in coordination, due to multifactors; hx of microvascular strokes, vascular dementia and new work up for MS complicated by urosepsis and GI bleed with cholecystitis. Patient with hard to flush cholecystostomy drain; IR to check.  Patient on target to meet rehab goals: Currently needs CGA for toileting; incontinent of multi loose stools. Foley removal; voiding trial. Needs CGA for set up for dressing due to cognitive impairment. Requires min assist for processing basic and familiar tasks. Auditory comprehension deficits noted with motor planning deficits. Needs min - mod assist for bed mobility and CGA for sit -stand and stand pivots but min assist for gait up to 150'. Progress limited by narrow base of support with scissoring gait and posterior sway. He is unsteady without a device but cognitive impairments and poor attention affect use of assistive devices. Continues to require max -  total assist for cognition.  *See Care Plan and progress notes for long and short-term goals.   Revisions to Treatment Plan:  Downgraded goals   Teaching Needs: Safety, medications, dietary modifications, drain management, transfers, toileting, etc.  Current Barriers to Discharge: Home enviroment access/layout and Lack of/limited family  support  Possible Resolutions to Barriers: Family education HH follow up services DME: RW, hospital bed, W/C     Medical Summary Current Status: gallbladder drain for subacute cholecystitis, CNS demyelination  Barriers to Discharge: Medical stability;Pending surgery/plan;Neurogenic Bowel & Bladder   Possible Resolutions to [employer] Focus: voiding trial to start today, monitor for orthostatic hypostension on Flomax trial   Continued Need for Acute Rehabilitation Level of Care: The patient requires daily medical management by a physician with specialized training in physical medicine and rehabilitation for the following reasons: Direction of a multidisciplinary physical rehabilitation program to maximize functional independence : Yes Medical management of patient stability for increased activity during participation in an intensive rehabilitation regime.: Yes Analysis of laboratory values and/or radiology reports with any subsequent need for medication adjustment and/or medical intervention. : Yes   I attest that I was present, lead the team conference, and concur with the assessment and plan of the team.   Dorien Chihuahua B 11/19/2021, 3:48 PM

## 2021-11-19 NOTE — Progress Notes (Signed)
Speech Language Pathology Weekly Progress and Session Note  Patient Details  Name: TRASK VOSLER MRN: [id number] Date of Birth: 11-26-1960  Beginning of progress report period: November 14, 2021 End of progress report period: November 20, 2021  {chl ip rehab slp time calculations:[id number]}  Short Term Goals: Week 1: SLP Short Term Goal 1 (Week 1): Pt will orient x4 with mod A verbal/visual cues with use of external aids SLP Short Term Goal 1 - Progress (Week 1): Met SLP Short Term Goal 2 (Week 1): Pt will sustain attention to functional tasks for 5 minute intervals with mod A verbal/visual redirection cues SLP Short Term Goal 2 - Progress (Week 1): Not met SLP Short Term Goal 3 (Week 1): Patient will complete basic, famliar tasks with mod A verbal/visual cues for problem solving skills SLP Short Term Goal 3 - Progress (Week 1): Not met SLP Short Term Goal 4 (Week 1): Patient will follow 2-step commands with max A verbal/visual cues to achieve 75% accuracy SLP Short Term Goal 4 - Progress (Week 1): Not met SLP Short Term Goal 5 (Week 1): Pt will complete 3-4 step sequencing tasks with mod A verbal cues SLP Short Term Goal 5 - Progress (Week 1): Not progressing (did not formally address 3-4 step sequencing as this is too complex of a task for pt at this time)  New Short Term Goals: {MAU:[id number]}  Weekly Progress Updates: Pt has demonstrated limited gains, as evident by meeting 1 out of 5 short-term goals this reporting period. Pt is currently completing basic level cognitive tasks with overall max-to-total A multimodal cues in regards to focused and sustained attention, basic auditory verbal comprehension, working memory, problem solving, awareness. Pt demonstrates improved recall in functional contexts and orientation skills. Pt continues to be limited by severity of deficits and emotional lability. Pt remains highly motivated and participative during ST intervention. Pt and family  education ongoing re: supportive cognitive-communication interventions and ST POC. Continue to recommend ST intervention during CIR admission, as well as ST f/u upon d/c. Recommend 24/7 supervision and assistance at time of discharge and home health SLP.   Intensity: Minumum of 1-2 x/day, 30 to 90 minutes Frequency: 3 to 5 out of 7 days Duration/Length of Stay: 11/22 Treatment/Interventions: Cognitive remediation/compensation;Environmental controls;Internal/external aids;Speech/Language facilitation;Functional tasks;Patient/family education;Therapeutic Activities   Daily Session  Skilled Therapeutic Interventions: Skilled ST treatment focused on *** goals.  Patient was left in *** with alarm activated and immediate needs within reach at end of session. Continue per current plan of care.       General    Pain    Therapy/Group: Individual Therapy  Patty Sermons 11/19/2021, 5:01 PM

## 2021-11-19 NOTE — Progress Notes (Signed)
Intermittent cath done on patient with NT+3, 100cc return of dark amber urine with sediment. Pt encouraged to increase fluid intake. Wife present and states she will encourage pt to void. Pt denies any s/s of UTI at this time.

## 2021-11-19 NOTE — Progress Notes (Signed)
PROGRESS NOTE   Subjective/Complaints:  Wife notes leakage with chole drain flushes , had one episode of RUE after a meal, no fevers   Review of Systems  Constitutional:  Negative for chills and fever.  HENT:  Negative for congestion.   Respiratory:  Negative for shortness of breath.   Cardiovascular:  Negative for chest pain and palpitations.  Gastrointestinal:  Negative for abdominal pain, constipation, diarrhea, nausea and vomiting.  Genitourinary: Negative.   Musculoskeletal:  Positive for back pain.  Neurological:  Negative for weakness and headaches.  Psychiatric/Behavioral:  Positive for memory loss.     Objective:   No results found. Recent Labs    11/17/21 0501  WBC 5.4  HGB 10.2*  HCT 33.3*  PLT 198    Recent Labs    11/17/21 0501  NA 139  K 3.6  CL 107  CO2 22  GLUCOSE 144*  BUN 7  CREATININE 0.68  CALCIUM 8.7*     Intake/Output Summary (Last 24 hours) at 11/19/2021 0815 Last data filed at 11/19/2021 0545 Gross per 24 hour  Intake 593 ml  Output 700 ml  Net -107 ml         Physical Exam: Vital Signs Blood pressure (!) 147/87, pulse 74, temperature 98.6 F (37 C), temperature source Oral, resp. rate 18, height 6' (1.829 m), weight 116.2 kg, SpO2 98 %.   General: No acute distress,lying in bed Mood and affect are appropriate, pleasant Heart: Regular rate and rhythm no rubs murmurs or extra sounds Lungs: Clear to auscultation, breathing unlabored, no rales or wheezes Abdomen: normoactive bowel sounds, soft nontender to palpation, nondistended Chole drain site without erythema or drainage , sutures intact  Extremities: No clubbing, cyanosis, or edema Skin: Warm, Dry No evidence of breakdown, no evidence of rash   GU: foley with yellow urine Neuro: Alert to person and states "you are my doctor" Sensation intact to LT in all 4 extremities Musculoskeletal: Strength 4+/5 throughout   No joint swelling  noted    Assessment/Plan: 1. Functional deficits which require 3+ hours per day of interdisciplinary therapy in a comprehensive inpatient rehab setting. Physiatrist is providing close team supervision and 24 hour management of active medical problems listed below. Physiatrist and rehab team continue to assess barriers to discharge/monitor patient progress toward functional and medical goals  Care Tool:  Bathing    Body parts bathed by patient: Chest, Face, Left arm, Right arm, Front perineal area, Right upper leg, Left upper leg   Body parts bathed by helper: Buttocks, Right lower leg, Left lower leg, Abdomen     Bathing assist Assist Level: Moderate Assistance - Patient 50 - 74%     Upper Body Dressing/Undressing Upper body dressing   What is the patient wearing?: Pull over shirt    Upper body assist Assist Level: Set up assist    Lower Body Dressing/Undressing Lower body dressing      What is the patient wearing?: Pants     Lower body assist Assist for lower body dressing: Minimal Assistance - Patient > 75%     Toileting Toileting    Toileting assist Assist for toileting: Maximal Assistance - Patient 25 -  49%     Transfers Chair/bed transfer  Transfers assist     Chair/bed transfer assist level: Contact Guard/Touching assist Chair/bed transfer assistive device: Programmer, multimedia   Ambulation assist      Assist level: Minimal Assistance - Patient > 75% Assistive device: Walker-rolling Max distance: 14f   Walk 10 feet activity   Assist     Assist level: Contact Guard/Touching assist Assistive device: Walker-rolling   Walk 50 feet activity   Assist    Assist level: Contact Guard/Touching assist Assistive device: Walker-rolling    Walk 150 feet activity   Assist Walk 150 feet activity did not occur: Safety/medical concerns  Assist level: Contact Guard/Touching assist Assistive device:  Walker-rolling    Walk 10 feet on uneven surface  activity   Assist     Assist level: Minimal Assistance - Patient > 75% Assistive device: WChemical engineer    Assist Is the patient using a wheelchair?: Yes Type of Wheelchair: Manual    Wheelchair assist level: Dependent - Patient 0% Max wheelchair distance: >1525f   Wheelchair 50 feet with 2 turns activity    Assist        Assist Level: Dependent - Patient 0%   Wheelchair 150 feet activity     Assist      Assist Level: Dependent - Patient 0%   Blood pressure (!) 147/87, pulse 74, temperature 98.6 F (37 C), temperature source Oral, resp. rate 18, height 6' (1.829 m), weight 116.2 kg, SpO2 98 %.   Medical Problem List and Plan: 1. 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 acute respiratory failure, cholecystitis, and urosepsis.             -patient may shower             -ELOS/Goals: 10-14 days S, Team conference today please see physician documentation under team conference tab, met with team  to discuss problems,progress, and goals. Formulized individual treatment plan based on medical history, underlying problem and comorbidities.              -Continue CIR with PT/OT, order SLP 2.  Antithrombotics: -DVT/anticoagulation:  Pharmaceutical: Lovenox (start 40 mg daily)             -antiplatelet therapy: none 3. Pain Management: Has chronic back and shoulder pain.   -Continue tylenol and robaxin PRN 4. Mood/Behavior/Sleep: LCSW to evaluate and provide emotional support                        -antipsychotic agents: n/a             -continue Atarax 10 mg TID prn anxiety 5. Neuropsych/cognition: This patient is not capable of making decisions on his own behalf. 6. Skin/Wound Care: Routine skin care checks             -dysphagia 3/thin liquids; carb  modified  -11/9 Consult for SLP  7. Fluids/Electrolytes/Nutrition: Routine Is and Os and follow-up chemistries 8: SVT: converted to SR, continue metoprolol 25 mg BID 9: Sepsis sue to GN UTI: he will continue on Augmentin through 11/11- afebrile has foley 10: Cholecystitis s/p cholecystostomy tube placement; no current indication for cholecystectomy Ask IR to eval chole drain  -routine drain site care and record output daily 11: Elevated LFTs: trending downward; follow-up CMP tomorrow/weekly. Continue to hold statin 12: Urinary retention:  indwelling Foley catheter, just finished abx for UTI, start flomax tonight, voiding trial in am  13: DM-2: CBGs q AC              -continue Semglee 12 units BID             -continue SSI  -11/9 CBGs fairly well controlled, continue Semglee 12 BID, continue to monitor  CBG (last 3)  Recent Labs    11/18/21 1636 11/18/21 2109 11/19/21 0559  GLUCAP 208* 168* 135*   -11/15, controlled with occ lability  Will cont semglee 14u BID  14: Hypertension: monitor TID and prn              Vitals:   11/18/21 1931 11/19/21 0544  BP: 128/82 (!) 147/87  Pulse: 81 74  Resp: 18 18  Temp: (!) 97.3 F (36.3 C) 98.6 F (37 C)  SpO2: 99% 98%  BP ok this am, mild systolic elevation yesterday pm will cont to monitor on Lopressor 64m BID  11/14 well controlled, continue to monitor 15: GIB: 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

## 2021-11-19 NOTE — Progress Notes (Signed)
Speech Language Pathology Daily Session Note  Patient Details  Name: Troy Randall MRN: [id number] Date of Birth: 1960-06-16  Today's Date: 11/19/2021 SLP Individual Time: 0906-1003 SLP Individual Time Calculation (min): 57 min  Short Term Goals: Week 1: SLP Short Term Goal 1 (Week 1): Pt will orient x4 with mod A verbal/visual cues with use of external aids SLP Short Term Goal 2 (Week 1): Pt will sustain attention to functional tasks for 5 minute intervals with mod A verbal/visual redirection cues SLP Short Term Goal 3 (Week 1): Patient will complete basic, famliar tasks with mod A verbal/visual cues for problem solving skills SLP Short Term Goal 4 (Week 1): Patient will follow 2-step commands with max A verbal/visual cues to achieve 75% accuracy SLP Short Term Goal 5 (Week 1): Pt will complete 3-4 step sequencing tasks with mod A verbal cues  Skilled Therapeutic Interventions: Skilled ST treatment focused on cognitive goals. Pt was passed off by OT who reported pt had multiple bowel movements during session. Pt reported he was feeling better and agreeable to ST intervention. Pt and spouse actively engaged in discussion regarding pt's interests and responsibilities around the house at home in effort to facilitate functional and pt preferred tasks while in rehab to maximize functional independence. Pt enjoys cooking and grilling, as well as participated in laundry, vacuuming, and yard work at home. SLP and spouse discussed various possible adaptations/modifications for these tasks including consider folding laundry instead of going downstairs to the basement to load washer/dryer; sequencing through recipes and gathering/organizing appropriate ingredients; sorting dishes, etc.   SLP took pt to ADL apartment to address focused attention, working memory, following commands, and basic problem solving and organization skills by sorting forks and spoons. Pt required total A fading to mod-to-max  following extensive attempts and modifications to cueing hierarchy to understand task objectives and to follow basic commands. Pt exhibited significant difficulty when dealt with focusing on two stimuli (i.e., fork AND spoon) vs. just one stimulus at a time. Pt was able to focus on this hands on task for maximum of 10-15 second intervals. SLP addressed this task for approximately 15 minutes with intermittent breaks (2-3 minutes) with extensive encouragement.   Pt participated in a functional discussion regarding holiday memories, favorite meals, recipes, etc. Pt was able to sustain attention to this simple conversational exchange for ~3-5 minute duration. Pt quite tearful but remaining hopeful in his ongoing recovery.   Patient transferred to bed with use of RW and CGA. Pt left in bed with alarm activated and immediate needs within reach at end of session. Continue per current plan of care.       Pain  None/denied  Therapy/Group: Individual Therapy  Tamala Ser 11/19/2021, 10:22 AM

## 2021-11-19 NOTE — Progress Notes (Signed)
Patient foley and IV removed around 10:30am per order. Patient with no void at 5:45pm. Patient's wife states he has not been drinking a lot although educated on this by this Char[employer] fundraiser. Patient educated on increasing fluid intake to void. Bladder scan done three times, showing 0 cc- 15cc. Patient states he has no desire to go. Patient encouraged to try to urinate 3-4 times since Foley removal.

## 2021-11-19 NOTE — Progress Notes (Signed)
Physical Therapy Session Note  Patient Details  Name: Troy Randall MRN: [id number] Date of Birth: August 27, 1960  Today's Date: 11/19/2021 PT Individual Time: 1105-1200 and 1349-1416 PT Individual Time Calculation (min): 55 min and 27 min  Short Term Goals: Week 1:  PT Short Term Goal 1 (Week 1): Pt will perform supine<>sit with CGA consistently PT Short Term Goal 2 (Week 1): Pt will perform sit<>stands using LRAD with CGA consistently PT Short Term Goal 3 (Week 1): Pt will perform bed<>chair transfers using LRAD with CGA PT Short Term Goal 4 (Week 1): Pt will ambulate at least 38ft using LRAD with CGA PT Short Term Goal 5 (Week 1): Pt will navigate 2 steps using LRAD with CGA  Skilled Therapeutic Interventions/Progress Updates:    Session 1: Pt received supine in bed with his wife, Troy Randall, present and pt agreeable to therapy session. Discussed D/C plan with wife and recommendation for pt to receive hospital bed and 20x18 manual wheelchair with pressure relieving cushion to allow pt increased independence with mobility and increased access to community when needed for medical appointments. Recommendation for HHPT to allow pt to participate in therapy in his own environment to increase independence.  Supine>sitting R EOB, HOB flat and not using bedrail, with min assist via L HHA and pt requiring min assist and increased time to problem solve and initiate coming upright due to pt's perceived difficulty of the task. Sit>supine supervision with no difficulty. Repeated coming back into sitting with pt requiring slightly decreased min assist for block practice. Practiced getting OOB on the R side because this is how pt's environment will be set-up at home.  Sit<>stands using RW with CGA/close supervision for safety throughout session.   Gait training ~33ft in room to navigate around the bed and to the w/c using RW with CGA for safety and pt demos good ability to navigate around furniture in room and  turn to sit in w/c keeping RW close with only min cuing.  Pt's wife reports concern about pt's safety navigating in/out of their car; therefore, notified nurse and transported pt down to perform real-life car transfer training with pt's wife.  Performed short distance ambulatory car transfer from w/c>wife's Jeep [employer] using RW with CGA for safety and therapist providing min cuing for pt to turn and sit down on seat prior to placing his feet in and then cuing to tuck his head down while sitting back - pt able to place his LEs into the vehicle and scoot hips back in seat without assist. Exiting the vehicle, pt does well motor planning pivoting to place feet out of vehicle on ground prior to coming to stand without cuing. Pt repeated same task with his wife providing the CGA for safety. Pt's wife reports feeling confident and comfortable assisting pt with this at D/C.  Gait training from outside through double doorways navigating inside down hallway to elevators and pt pushing the elevator buttons with cuing for orientation of button locations and navigates on/off elevator using AD without cuing - pt does have slight instability when navigating RW over doorway thresholds but able to do so safely - CGA primarily with intermittent light min assist for steadying during and +2 providing w/c follow in event of fatigue for safety.  Swapped pt from 22in to 20in wide wheelchair to trial to ensure proper fit prior to D/C.  Gait training ~367ft from wheelchair room to pt's room using RW with CGA progressing to light min assist towards end of gait  due to fatigue causing increased postural instability and +2 w/c follow for safety in event of fatigue (not used) - requires mod cuing for navigating in environment in hallways but once in pt's room pt able to navigate around bed to sit on EOB with min cuing.  Sit>R sidelying supervision and pt left sidelying in bed with needs in reach, lines intact, bed alarm on, and his  wife present.    Session 2: Pt received supine in bed with his wife, Troy Randall, present and pt agreeable to therapy session. Supine>sitting R EOB, HOB flat and not using bedrail, to work towards regular bed with min assist to bring trunk upright via L HHA. Pt/wife requesting to use bathroom because pt has not voided since catheter was removed. Donned shoes total assist for time. Sit>stand EOB>RW with CGA. Gait training ~18ft into bathroom using RW with CGA and pt having some instability and poor AD management over bathroom threshold but able to maintain balance without increased assist. Standing with CGA/supervision using UE support on RW during mod assist LB clothing management for time management. Despite increased time on toilet, pt unable to void - nurse made aware. Also, pt noted to have significant rash between legs where brief rubs his skin - applied barrier cream and notified nurse. Gait training ~168ft using RW out into hallway working on navigating through doorways and around obstacles and other individuals who are walking - CGA/light min assist for steadying and pt having a few instances of slight knee buckling but able to recover and continue. Sit>R sidelying with supervision. Pt left R sidelying in bed with needs in reach, bed alarm on, and lines intact.  Therapy Documentation Precautions:  Precautions Precautions: Fall Precaution Comments: R upper quadrant drain Restrictions Weight Bearing Restrictions: No Other Position/Activity Restrictions: pt unable to sit on bottom for extended periods of time due to pain (even on geomat)   Pain:  Session 1: No reports of pain throughout session.  Session 2: No reports of pain throughout session.   Therapy/Group: Individual Therapy  Ginny Forth , PT, DPT, NCS, CSRS 11/19/2021, 7:55 AM

## 2021-11-19 NOTE — Procedures (Signed)
Pre procedural Dx: Cholecystomy tube malfunction Post procedural Dx: Same  Technically successful fluoro guided exchange and up sizing now 12 Fr drainage catheter placement into the gallbladder lumen. Chole tube connected to gravity bag.  EBL: None Complications: None immediate  Katherina Right, MD Pager #: [phone]

## 2021-11-19 NOTE — Progress Notes (Signed)
Occupational Therapy Session Note  Patient Details  Name: Troy Randall MRN: [id number] Date of Birth: 1960/10/11  Today's Date: 11/19/2021 OT Individual Time: 3220-2542 OT Individual Time Calculation (min): 64 min   Short Term Goals: Week 1:  OT Short Term Goal 1 (Week 1): Pt will complete UB dressing with supervision OT Short Term Goal 2 (Week 1): Pt will complete LB dressing with min A OT Short Term Goal 3 (Week 1): Pt will completed U/LB bathing wiht min A OT Short Term Goal 4 (Week 1): Pt will complete toileting with min A  Skilled Therapeutic Interventions/Progress Updates:     Pt received resting in bed with wife present in room. Pt in good spirits and receptive to skilled OT session. Pt reporting 0/10 pain during session. Catheter bag emptied at beginning of session 225 ml.   Pt supine>EOB CGA. Pt completed UB dressing EOB with set-up assist. Pt completed LB dressing with min A to thread feet into pants. Pt able to stand with RW CGA to pull pants to waist with min A needed to assist with adjusting back of pants. Pt donned R sock and shoe sitting EOB with min A to adjust heel of shoe. Pt then reported urgent need to have bowel movement, second shoe donned total A in order to get Pt to bathroom quickly. Pt ambulated to bathroom with RW CGA with Pt able to follow directional cues with min A. Pt had continent formed bowel movement in toilet while sitting on elevated toilet seat. Pt mod A to complete posterior peri care initiating cleaning bottom while standing with CGA and requiring assistance to finish task d/t becoming anxious when seeing bowel movement on wash cloth. Therapeutic support and listening provided with noted improvement. Pt mod A to pull up pants. Pt began ambulating out of the bathroom, but reported need to have second bowel movement. Pt had second bowel movement, smaller amount, loose stool. Pt requested increased time to sit on toilet and was provided 5 minutes. Pt stated  he was finished, peri care was performed max A, but then Pt reported need for 3rd bowel movement. Pt had 3rd bowel movement, moderate amount of loose stool in toilet, posterior peri-care performed max A with patient in standing with RW. Pt ambulated to sink and completed hand washing following gestural cues to get soap and turn on/off water with min A. Pt reported fatigue and was provided wc to rest. Pt brushed his teeth sitting in wc min A to don toothpaste. Pt was left resting in wc with call bell in reach, seat belt alarm on, breaks, locked, and all needs met.   Therapy Documentation Precautions:  Precautions Precautions: Fall Precaution Comments: R upper quadrant drain Restrictions Weight Bearing Restrictions: No Other Position/Activity Restrictions: pt unable to sit on bottom for extended periods of time due to pain (even on geomat) General:   Vital Signs: Therapy Vitals Temp: 98.6 F (37 C) Temp Source: Oral Pulse Rate: 74 Resp: 18 BP: (Abnormal) 147/87 Patient Position (if appropriate): Lying Oxygen Therapy SpO2: 98 % O2 Device: Room Air Pain:   ADL: ADL Eating: Minimal assistance Grooming: Minimal assistance, Maximal cueing Where Assessed-Grooming: Edge of bed Upper Body Bathing: Maximal cueing, Minimal assistance Where Assessed-Upper Body Bathing: Shower Lower Body Bathing: Maximal cueing, Moderate assistance Where Assessed-Lower Body Bathing: Shower Upper Body Dressing: Maximal cueing, Minimal assistance Where Assessed-Upper Body Dressing: Edge of bed Lower Body Dressing: Maximal cueing, Moderate assistance Where Assessed-Lower Body Dressing: Edge of bed Toileting: Dependent Where  Assessed-Toileting: Bed level Toilet Transfer: Minimal assistance Science writer: Geophysical data processor: Maximal cueing, Minimal assistance Social research officer, government Method: Heritage manager: Radio broadcast assistant   Therapy/Group:  Individual Therapy  Janey Genta 11/19/2021, 8:42 AM

## 2021-11-19 NOTE — Progress Notes (Signed)
Patient ID: Troy Randall, male   DOB: [birth year], 61 y.o.   MRN: [id number]  Team Conference Report to Patient/Family  Team Conference discussion was reviewed with the patient and caregiver, including goals, any changes in plan of care and target discharge date.  Patient and caregiver express understanding and are in agreement.  The patient has a target discharge date of 11/26/21.  Sw met with patient and spouse and provided team conference updates. Patient requesting fully electric HB.  Spouse anticipates purchasing a lift chair. Patient's son, daughter, brother and niece will be available for assistance at discharge will everyone nearby.  Patient will require HH- PT OT SLP and rolling walker.  Dyanne Iha 11/19/2021, 2:18 PM

## 2021-11-19 NOTE — Discharge Summary (Signed)
Physician Discharge Summary  Patient ID: Troy Randall MRN: [id number] DOB/AGE: 1960-11-15 61 y.o.  Admit date: 11/12/2021 Discharge date: 11/26/2021  Discharge Diagnoses:  Principal Problem:   Acute disseminated encephalomyelitis Active Problems:   Debility Functional deficits secondary to acute disseminated encephalomyelitis, demyelinating disease in the brain, C-spine and T-spine debility GI bleed Cholecystitis Diabetes mellitus Hypertension Urinary retention Hyperlipidemia Chronic colitis Low back pain Bilateral shoulder pain UTI Elevated LFTs Mild anemia Obesity  Discharged Condition: good  Significant Diagnostic Studies: Narrative & Impression  INDICATION: History of acute cholecystitis, post ultrasound fluoroscopic guided cholecystostomy tube placement on 09/07/2021.   Patient with excessive pericatheter leakage and as such returns today for cholangiogram with potential cholecystostomy tube exchange and up sizing.   EXAM: FLUOROSCOPIC GUIDED CHOLECYSTOSTOMY TUBE EXCHANGE   COMPARISON:  Cholangiogram via existing cholecystostomy tube-11/06/2021; cholecystostomy tube exchange-10/24/2021   MEDICATIONS: None   ANESTHESIA/SEDATION: None   CONTRAST:  38mL OMNIPAQUE IOHEXOL 300 MG/ML SOLN - administered into the gallbladder lumen.   FLUOROSCOPY TIME:  2 minutes, 24 seconds ([id number] mGy)   COMPLICATIONS: None immediate.   PROCEDURE: The patient was positioned supine on the fluoroscopy table. The external portion of the existing cholecystostomy tube as well as the surrounding skin was prepped and draped in usual sterile fashion. A time-out was performed prior to the initiation of the procedure.   A preprocedural spot fluoroscopic image was obtained of the right upper abdominal quadrant existing cholecystostomy tube.   The skin surrounding the cholecystostomy tube was anesthetized with 1% lidocaine with epinephrine.   The external portion of the  cholecystostomy tube was cut and cannulated with a short Amplatz wire which was advanced through the tube and coiled within the gallbladder lumen. Under fluoroscopic guidance, the existing cholecystostomy tube was exchanged for a Kumpe catheter which was manipulated into the more central aspect of the gallbladder lumen.   Next, under intermittent fluoroscopic guidance, the existing 10 [place] cholecystostomy tube was exchanged for a new, slightly larger now 102 [place] cholecystostomy tube which was repositioned into the more central aspect of the gallbladder lumen.   Contrast injection confirms appropriate positioning and functionality of the cholecystomy tube.   The cholecystostomy tube was flushed with a small amount of saline and reconnected to a gravity bag. The cholecystostomy tube was secured with an interrupted suture and a Stat Lock device. A dressing was applied.   The patient tolerated the procedure well without immediate postprocedural complication.   FINDINGS: Preprocedural spot fluoroscopic image demonstrates unchanged positioning of cholecystostomy tube with end coiled and locked over the expected location of the fundus of the gallbladder. Contrast injection demonstrates passage of contrast through the gallbladder and cystic duct to the level of the CBD. There is reflux of contrast along the catheter tract to the skin entrance surface site.   Persistent filling defects are seen within the neck of the gallbladder, the cystic duct as well as two discrete filling defects within the distal CBD, findings compatible with cholelithiasis and choledocholithiasis.   After fluoroscopic guided exchange, the new, slightly larger, now 39 [place] cholecystostomy tube is more ideally positioned with end coiled and locked within the central aspect of the gallbladder lumen.   Post exchange cholangiogram demonstrates appropriate positioning and functionality of the new  cholecystostomy tube.   IMPRESSION: 1. Successful fluoroscopic guided exchange, repositioning and up sizing of now 68 French cholecystostomy tube. 2. Cholelithiasis and choledocholithiasis including two nonocclusive gallstones within the distal aspect of the CBD.   PLAN: - The patient's cholecystostomy  tube was reconnected to a gravity bag.   - Assuming interval cholecystectomy is not pursued, recommend fluoroscopic guided exchange in 2 months (mid January 2024).     Electronically Signed   By: Simonne Come M.D.   On: 11/19/2021 17:44     Labs:  Basic Metabolic Panel: Recent Labs  Lab 11/20/21 0849 11/24/21 0509 11/26/21 0813  NA 139 142  --   K 3.4* 3.0* 3.2*  CL 110 106  --   CO2 22 24  --   GLUCOSE 161* 100*  --   BUN 9 6  --   CREATININE 0.83 0.62  --   CALCIUM 8.5* 8.7*  --     CBC: Recent Labs  Lab 11/24/21 0509  WBC 4.4  HGB 10.2*  HCT 31.5*  MCV 85.8  PLT 183    CBG: Recent Labs  Lab 11/25/21 0546 11/25/21 1119 11/25/21 1648 11/25/21 2028 11/26/21 0621  GLUCAP 102* 99 97 89 81    Brief HPI:   Troy Randall is a 61 y.o. male who presented to [facility] emergency department on 09/04/2021 reporting hematochezia with medical history of prior stroke maintained on Plavix and aspirin.  He ultimately required 20 units of packed red blood cells and underwent interventional radiology consultation with coil of the jejunal branch of the SMA on 9/2.  He developed acute encephalopathy and CT head was performed.  Neurology was consulted and he underwent lumbar puncture and MRI of the brain performed secondary to multiple interval white matter insults versus inflammatory process.  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  He was ultimately admitted to the inpatient rehab unit on 10/26.  On 11/2 he developed rigors and rectal temp of 100.  Sepsis  work-up initiated.  His vital signs were rechecked and his heart rate was in the 160s and rapid response was called.  He was transferred to acute care/[name] service to be monitored for SVT and sepsis work-up.  His blood cultures were positive for E. coli and he was treated with IV cephalosporins.  He was readmitted to the inpatient rehab department on 11/8 within the dwelling Foley catheter and on oral Augmentin.  Cholecystostomy tube remains in place.   Hospital Course: Troy Randall was admitted to rehab 11/12/2021 for inpatient therapies to consist of PT, ST and OT at least three hours five days a week. Past admission physiatrist, therapy team and rehab RN have worked together to provide customized collaborative inpatient rehab. LFTs and hemoglobin improved on follow-up labs 11/9. Statin remained held. SLP eval. Hemoglobin improved to 11.1. Augmentin was continued through 11/11 for gram negative UTI. Flomax started. Urecholine started. Voiding trial was attempted, but patient failed and did not desire to learn in and out self catheterization. Foley replaced 11/21 after no improvement with higher dose Flomax. LFTs normal on 11/13. Statin resumed. Cholecystostomy tube exchanged on 11/15.  Epigastric pain changed Pepcid to Protonix. Neuropsychology consultation on 11/21. Urecholine discontinued, Flomax 0.4 mg continued.   Blood pressures were monitored on TID basis and remained stabel on metoprolol 25 mg BID.  Diabetes has been monitored with ac/hs CBG checks and SSI was use prn for tighter BS control. Semglee increased to 13 units BID on 11/9, then to 14 units on 11/15 and to 15 units BID on 11/16.    Rehab course: During patient's stay in rehab weekly team conferences were held to monitor patient's progress,  set goals and discuss barriers to discharge. At admission, patient required mod-max assist with basic self-care skills and min-mod assist with mobility. Similar to performance before pt transferred  back to acute care, pt continues to exhibits severe cognitive-linguistic deficits in there areas of orientation, sustained attention, problem solving, memory (immediate, short-term) and intellectual awareness.   He has had improvement in activity tolerance, balance, postural control as well as ability to compensate for deficits. He has had improvement in functional use RUE/LUE  and RLE/LLE as well as improvement in awareness. Pt currently requires mod verbal cues to realize when a mistake has occurred during functional activities and ADLs. Pt benefits from repetition and direct cues to provide insight to when he has made a mistake.  Patient to discharge at an ambulatory level Supervision/CGA using RW. Patient's care partner attended hands-on education/training and is independent to provide the necessary physical and cognitive assistance at discharge.     Disposition:  Discharge disposition: 01-Home or Self Care        Diet: carb modified  Special Instructions: No driving, alcohol consumption or tobacco use.  Follow-up with PCP in 1 week to check potassium level, BP/meds.  30-35 minutes were spent on discharge planning and discharge summary. Discharge Instructions     Ambulatory referral to Physical Medicine Rehab   Complete by: As directed    Hospital follow-up   Discharge patient   Complete by: As directed    Discharge disposition: 01-Home or Self Care   Discharge patient date: 11/26/2021      Allergies as of 11/26/2021       Reactions   Metformin And Related    GI upset        Medication List     STOP taking these medications    amoxicillin-clavulanate 875-125 MG tablet Commonly known as: AUGMENTIN   glipiZIDE 10 MG 24 hr tablet Commonly known as: Glucotrol XL   hydrOXYzine 10 MG tablet Commonly known as: ATARAX   metFORMIN 500 MG 24 hr tablet Commonly known as: GLUCOPHAGE-XR   prochlorperazine 10 MG/2ML injection Commonly known as: COMPAZINE        TAKE these medications    acetaminophen 325 MG tablet Commonly known as: TYLENOL Take 1-2 tablets (325-650 mg total) by mouth every 4 (four) hours as needed for mild pain.   BD Veo Insulin Syringe U/F 31G X 15/64" 1 ML Misc Generic drug: Insulin Syringe-Needle U-100 USE AS DIRECTED   glucose blood test strip Commonly known as: OneTouch Verio TEST twice a day   Insulin Pen Needle 32G X 5 MM Misc Commonly known as: NovoTwist Use two daily to inject Victoza and Toujeo.   methocarbamol 500 MG tablet Commonly known as: ROBAXIN Take 1 tablet (500 mg total) by mouth every 6 (six) hours as needed for muscle spasms.   metoprolol tartrate 25 MG tablet Commonly known as: LOPRESSOR Take 1 tablet (25 mg total) by mouth 2 (two) times daily.   OneTouch Delica Lancets 33G Misc Use to check blood sugar once a day dx code E11.65   pantoprazole 40 MG tablet Commonly known as: PROTONIX Take 1 tablet (40 mg total) by mouth daily. Start taking on: November 27, 2021 What changed: when to take this   rosuvastatin 40 MG tablet Commonly known as: Crestor Take 1 tablet (40 mg total) by mouth daily.   tamsulosin 0.4 MG Caps capsule Commonly known as: FLOMAX Take 1 capsule (0.4 mg total) by mouth daily after supper.   traZODone 50 MG  tablet Commonly known as: DESYREL Take 0.5-1 tablets (25-50 mg total) by mouth at bedtime as needed for sleep.   Tyler Aas FlexTouch 200 UNIT/ML FlexTouch Pen Generic drug: insulin degludec Inject under skin 15 units twice a day. What changed:  how much to take how to take this when to take this additional instructions        Follow-up Information     Kirsteins, Luanna Salk, MD Follow up.   Specialty: Physical Medicine and Rehabilitation Why: office will call you to arrange your appt (sent) Contact information: [place] [address] [phone]         [name], MD Follow up.   Specialty: Internal Medicine Why:  Call in 1-2 days to make arrangements for hospital follow-up appointment. Contact information: [address] [facility] [address] [phone]         Felecia Shelling Nanine Means, MD Follow up.   Specialty: Neurology Why: Call in 1-2 days to make arrangements for hospital follow-up appointment. Contact information: [place] [address] [phone]         Montez Morita, Quillian Quince, MD Follow up.   Specialty: Gastroenterology Why: Call in 1-2 days to make arrangements for hospital follow-up appointment. Contact information: [id number] [place] [address] [phone]         [facility] Follow up.   Why: Left message with Benjie Karvonen on 11/26/2021 for referral appointment. Contact information: [place] [phone] [phone]        Erroll Luna, MD Follow up.   Specialty: General Surgery Why: Call in 1-2 days to make arrangements for hospital follow-up appointment. Follow-up inpatient consultation for chloecystitis. Has cholecystostomy tube. Contact information: [address] [place] [address] [phone]         Sandi Mariscal, MD Follow up.   Specialties: Interventional Radiology, Radiology Why: IR scheduler will call you to arrange appointment for cholecystostomy exchange in 2 months if you do not have your gallbladder taken out by then. Please call [phone] with any questions or concerns prior to your appointment. Contact information: [place] [address] [phone]                 Signed: Barbie Banner 11/26/2021, 6:17 PM

## 2021-11-20 ENCOUNTER — Encounter (HOSPITAL_COMMUNITY): Payer: Self-pay

## 2021-11-20 DIAGNOSIS — G0400 Acute disseminated encephalitis and encephalomyelitis, unspecified: Secondary | ICD-10-CM | POA: Diagnosis not present

## 2021-11-20 LAB — COMPREHENSIVE METABOLIC PANEL
ALT: 18 U/L (ref 0–44)
AST: 20 U/L (ref 15–41)
Albumin: 2.9 g/dL — ABNORMAL LOW (ref 3.5–5.0)
Alkaline Phosphatase: 87 U/L (ref 38–126)
Anion gap: 7 (ref 5–15)
BUN: 9 mg/dL (ref 6–20)
CO2: 22 mmol/L (ref 22–32)
Calcium: 8.5 mg/dL — ABNORMAL LOW (ref 8.9–10.3)
Chloride: 110 mmol/L (ref 98–111)
Creatinine, Ser: 0.83 mg/dL (ref 0.61–1.24)
GFR, Estimated: >60 mL/min (ref >60)
Glucose, Bld: 161 mg/dL — ABNORMAL HIGH (ref 70–99)
Potassium: 3.4 mmol/L — ABNORMAL LOW (ref 3.5–5.1)
Sodium: 139 mmol/L (ref 135–145)
Total Bilirubin: 0.4 mg/dL (ref 0.3–1.2)
Total Protein: 6 g/dL — ABNORMAL LOW (ref 6.5–8.1)

## 2021-11-20 LAB — GLUCOSE, CAPILLARY
Glucose-Capillary: 134 mg/dL — ABNORMAL HIGH (ref 70–99)
Glucose-Capillary: 155 mg/dL — ABNORMAL HIGH (ref 70–99)
Glucose-Capillary: 156 mg/dL — ABNORMAL HIGH (ref 70–99)
Glucose-Capillary: 164 mg/dL — ABNORMAL HIGH (ref 70–99)

## 2021-11-20 MED ORDER — INSULIN GLARGINE-YFGN 100 UNIT/ML ~~LOC~~ SOLN
15.0000 [IU] | Freq: Two times a day (BID) | SUBCUTANEOUS | Status: DC
  Administered 2021-11-20 – 2021-11-26 (×12): 15 [IU] via SUBCUTANEOUS
  Filled 2021-11-20 (×13): qty 0.15

## 2021-11-20 NOTE — Progress Notes (Incomplete)
Occupational Therapy Session Note  Patient Details  Name: Troy Randall MRN: [id number] Date of Birth: 1960-02-01  {CHL IP REHAB OT TIME CALCULATIONS:304400400}   Short Term Goals: {OT YKZ:[id number]}  Skilled Therapeutic Interventions/Progress Updates:      Therapy Documentation Precautions:  Precautions Precautions: Fall Precaution Comments: R upper quadrant drain Restrictions Weight Bearing Restrictions: No Other Position/Activity Restrictions: pt unable to sit on bottom for extended periods of time due to pain (even on geomat) General:   Vital Signs:   Pain:   ADL: ADL Eating: Minimal assistance Grooming: Minimal assistance, Maximal cueing Where Assessed-Grooming: Edge of bed Upper Body Bathing: Maximal cueing, Minimal assistance Where Assessed-Upper Body Bathing: Shower Lower Body Bathing: Maximal cueing, Moderate assistance Where Assessed-Lower Body Bathing: Shower Upper Body Dressing: Maximal cueing, Minimal assistance Where Assessed-Upper Body Dressing: Edge of bed Lower Body Dressing: Maximal cueing, Moderate assistance Where Assessed-Lower Body Dressing: Edge of bed Toileting: Dependent Where Assessed-Toileting: Bed level Toilet Transfer: Minimal assistance Acupuncturist: Psychiatric nurse: Maximal cueing, Minimal assistance Film/video editor Method: Designer, industrial/product: Scientist, research (physical sciences)   Exercises:   Other Treatments:     Therapy/Group: {Therapy/Group:[id number]}  Vicenta Dunning 11/20/2021, 2:42 PM

## 2021-11-20 NOTE — Progress Notes (Signed)
Occupational Therapy Session Note  Patient Details  Name: Troy Randall MRN: [id number] Date of Birth: Feb 12, 1960  Today's Date: 11/20/2021 OT Individual Time: 1005-1105 OT Individual Time Calculation (min): 60 min   Short Term Goals: Week 1:  OT Short Term Goal 1 (Week 1): Pt will complete UB dressing with supervision OT Short Term Goal 1 - Progress (Week 1): Progressing toward goal OT Short Term Goal 2 (Week 1): Pt will complete LB dressing with min A OT Short Term Goal 2 - Progress (Week 1): Progressing toward goal OT Short Term Goal 3 (Week 1): Pt will completed U/LB bathing wiht min A OT Short Term Goal 3 - Progress (Week 1): Progressing toward goal OT Short Term Goal 4 (Week 1): Pt will complete toileting with min A OT Short Term Goal 4 - Progress (Week 1): Progressing toward goal OT Short Term Goal 5 - Progress (Week 1): Met  Skilled Therapeutic Interventions/Progress Updates:     Pt received in room resting in wc in good spirits and willing to participate in skilled OT session reporting 0/10 pain. Pt supine>EOB CGA. Pt ambulated to bathroom to attempt to void prior to OT session. Pt provided increased time on toilet with no success of void or bowel movement. Pt stood at sink and washed hand with supervision and gestural cues provided to locate soap and hand towels. Pt transported to ADL apartment to complete simulated kitchen tasks to address problem solving, attention, and sequencing deficits and increase independence in IADLs upon dc home.   Pt completed dish sorting tasks standing at counter top with RW close supervision. Pt able to read labels and identify items to place in cabinets/drawers with moderate direct verbal cues.   Pt completed silverware sorting task with Pt able to correctly identify spoon and forks 75% of the time. Pt benefits from receiving clear 1-step directions when placed in a contextual environment.   Pt completed clock activity at microwave to change  clock time to be accurate. Pt provided maximal multimodal direct cues to complete task.   Pt instructed to make a peanut butter gram cracker sandwich. PT initially tried to hold all items-knife, peanut butter, and gram crackers in his hand to complete task requiring mod directional cues to complete one step at a time. Pt demonstrating deficits in working memory and comprehension when given 1-2 step cues. Pt benefits from low stim environments with limited distractions. Pt was transported back to room total A in wc and left resting in wc with wife present, seat belt alarm on, and all needs met.   Therapy Documentation Precautions:  Precautions Precautions: Fall Precaution Comments: R upper quadrant drain Restrictions Weight Bearing Restrictions: No Other Position/Activity Restrictions: pt unable to sit on bottom for extended periods of time due to pain (even on geomat) General:   Vital Signs: Therapy Vitals Pulse Rate: 68 Pain:   ADL: ADL Eating: Minimal assistance Grooming: Minimal assistance, Maximal cueing Where Assessed-Grooming: Edge of bed Upper Body Bathing: Maximal cueing, Minimal assistance Where Assessed-Upper Body Bathing: Shower Lower Body Bathing: Maximal cueing, Moderate assistance Where Assessed-Lower Body Bathing: Shower Upper Body Dressing: Maximal cueing, Minimal assistance Where Assessed-Upper Body Dressing: Edge of bed Lower Body Dressing: Maximal cueing, Moderate assistance Where Assessed-Lower Body Dressing: Edge of bed Toileting: Dependent Where Assessed-Toileting: Bed level Toilet Transfer: Minimal assistance Science writer: Geophysical data processor: Maximal cueing, Minimal assistance Social research officer, government Method: Heritage manager: Transfer tub bench  Therapy/Group: Individual Therapy  Bertram Gala  Kaidence Sant 11/20/2021, 12:57 PM

## 2021-11-20 NOTE — Progress Notes (Signed)
Physical Therapy Weekly Progress Note  Patient Details  Name: Troy Randall MRN: [id number] Date of Birth: 12-30-1960  Beginning of progress report period: November 13, 2021 End of progress report period: November 20, 2021  Today's Date: 11/20/2021 PT Individual Time: 1105-1200 PT Individual Time Calculation (min): 55 min   Patient has met 5 of 5 short term goals. Troy Randall is progressing well with his functional mobility performing supine<>sit with CGA using hospital bed features, sit<>stands and stand pivot transfers using RW with CGA, and ambulating up to 262f using RW with CGA as well as navigate stairs for home entry with CGA. Pt continues to have impaired attention, problem solving, motor planning, awareness, and L inattention impacting his safety and independence with functional mobility. Pt is planning to D/C home with 24hr support from his wife, Angie, with recommendation for follow-up HHPT.   Patient continues to demonstrate the following deficits muscle weakness, decreased cardiorespiratoy endurance, impaired timing and sequencing, unbalanced muscle activation, decreased coordination, and decreased motor planning, decreased attention to left, decreased initiation, decreased attention, decreased awareness, decreased problem solving, decreased safety awareness, decreased memory, and delayed processing, and decreased standing balance, decreased postural control, and decreased balance strategies and therefore will continue to benefit from skilled PT intervention to increase functional independence with mobility.  Patient progressing toward long term goals..  Continue plan of care.  PT Short Term Goals Week 1:  PT Short Term Goal 1 (Week 1): Pt will perform supine<>sit with CGA consistently PT Short Term Goal 1 - Progress (Week 1): Met (using hospital bed features per home set-up) PT Short Term Goal 2 (Week 1): Pt will perform sit<>stands using LRAD with CGA consistently PT Short Term  Goal 2 - Progress (Week 1): Met PT Short Term Goal 3 (Week 1): Pt will perform bed<>chair transfers using LRAD with CGA PT Short Term Goal 3 - Progress (Week 1): Met PT Short Term Goal 4 (Week 1): Pt will ambulate at least 713fusing LRAD with CGA PT Short Term Goal 4 - Progress (Week 1): Met PT Short Term Goal 5 (Week 1): Pt will navigate 2 steps using LRAD with CGA PT Short Term Goal 5 - Progress (Week 1): Met Week 2:  PT Short Term Goal 1 (Week 2): = to LTGs based on ELOS  Skilled Therapeutic Interventions/Progress Updates:  Ambulation/gait training;Community reintegration;DME/adaptive equipment instruction;Neuromuscular re-education;Psychosocial support;Stair training;Wheelchair propulsion/positioning;UE/LE Strength taining/ROM;Balance/vestibular training;Discharge planning;Functional electrical stimulation;Pain management;Skin care/wound management;Therapeutic Activities;UE/LE Coordination activities;Cognitive remediation/compensation;Disease management/prevention;Functional mobility training;Patient/family education;Splinting/orthotics;Therapeutic Exercise;Visual/perceptual remediation/compensation   Pt received sitting in w/c with his wife, Angie, present and pt agreeable to therapy session. Transported to gym in w/c for time management and energy conservation. Gait training ~8015fsing RW with CGA for safety/steadying and pt achieving reciprocal stepping pattern with narrow BOS but no true scissoring.  Simulated pt's house entry with 2 step-ups without HRs - pt's wife has picture of entry. Therapist demonstrated and educated pt/wife on sequencing navigating 2 stairs using RW. Pt ascended/descended 2 steps using RW x2 with therapist providing CGA for steadying and min assist for AD management - pt has greatest difficulty with managing AD during descent. Pt repeated same task with his wife providing assistance and determined that due to stairs being closed in through doorway and walls that pt's  wife may not be able to safely reach to assist pt with placing AD properly on stairs; therefore, determined pt likely will be safest using UE support on doorframe instead because pt's primary instability during stairs is  when trying to move the RW.  Therapist set-up 1 step, 4" height, in a doorway to simulate this with pt using doorframe for support while continuing to require CGA for safety - performed 1x with therapist and 1x with his wife. Pt had no knee instability noted but did discuss recommendation with wife of having a ramp placed for pt to walk up onto 1st step and then pt could use RW to step-up the threshold into the house to allow him increased independence using AD with greatest safety and decreased caregiver burden. Pt's wife verbalized understanding.  Pt ambulated ~149f using RW with his wife providing CGA - therapist educating on use of gait belt and proper positioning for guarding to maintain patient safety - pt's wife does well at cuing pt for safety when navigating in hallway around obstacles and around other individuals walking by.  Pt's wife reports feeling confident assisting pt with tasks reviewed thus far and has no additional questions/concerns at this time.   Pt performed 2x10 reps of the following exercises targeting B LE functional strengthening:  - sit<>stands from 23in high mat table requiring light min assist through his hands to rise into standing - standing with B HHA heel raises, achieves minimal heel clearance  Gait training ~1517fback to pt's room with R HHA and min assist with +2 close by for safety but not providing physical assistance - pt has mildly increased R/L postural sway and path deviation with continued narrow BOS, slight scissoring, and reciprocal stepping pattern - pt fatigued towards end of gait requiring 1x standing rest break.  Pt left seated in w/c with needs in reach and his wife present to assume care of pt.  Therapy Documentation Precautions:   Precautions Precautions: Fall Precaution Comments: R upper quadrant drain Restrictions Weight Bearing Restrictions: No Other Position/Activity Restrictions: pt unable to sit on bottom for extended periods of time due to pain (even on geomat)   Pain:  No reports of pain throughout session.  Therapy/Group: Individual Therapy  CaTawana Scale PT, DPT, NCS, CSRS 11/20/2021, 7:57 AM

## 2021-11-20 NOTE — Plan of Care (Signed)
  Problem: RH SKIN INTEGRITY Goal: RH STG SKIN FREE OF INFECTION/BREAKDOWN Description: Skin will be free of any additional infection/breakdown with min assist Outcome: Progressing

## 2021-11-20 NOTE — Progress Notes (Signed)
Patient ID: Troy Randall, male   DOB: Jun 22, 1960, 61 y.o.   MRN: [id number]  Rolling Walker, [facility], Hospital Bed and Slide Board ordered through [employer].

## 2021-11-20 NOTE — Progress Notes (Signed)
PROGRESS NOTE   Subjective/Complaints:  Appreciate IR note ,  Not voiding requiring I/O cath, poor output   Review of Systems  Constitutional:  Negative for chills and fever.  HENT:  Negative for congestion.   Respiratory:  Negative for shortness of breath.   Cardiovascular:  Negative for chest pain and palpitations.  Gastrointestinal:  Negative for abdominal pain, constipation, diarrhea, nausea and vomiting.  Genitourinary: Negative.   Musculoskeletal:  Positive for back pain.  Neurological:  Negative for weakness and headaches.  Psychiatric/Behavioral:  Positive for memory loss.     Objective:   IR Catheter Tube Change  Result Date: 11/19/2021 INDICATION: History of acute cholecystitis, post ultrasound fluoroscopic guided cholecystostomy tube placement on 09/07/2021. Patient with excessive pericatheter leakage and as such returns today for cholangiogram with potential cholecystostomy tube exchange and up sizing. EXAM: FLUOROSCOPIC GUIDED CHOLECYSTOSTOMY TUBE EXCHANGE COMPARISON:  Cholangiogram via existing cholecystostomy tube-11/06/2021; cholecystostomy tube exchange-10/24/2021 MEDICATIONS: None ANESTHESIA/SEDATION: None CONTRAST:  28mL OMNIPAQUE IOHEXOL 300 MG/ML SOLN - administered into the gallbladder lumen. FLUOROSCOPY TIME:  2 minutes, 24 seconds (142 mGy) COMPLICATIONS: None immediate. PROCEDURE: The patient was positioned supine on the fluoroscopy table. The external portion of the existing cholecystostomy tube as well as the surrounding skin was prepped and draped in usual sterile fashion. A time-out was performed prior to the initiation of the procedure. A preprocedural spot fluoroscopic image was obtained of the right upper abdominal quadrant existing cholecystostomy tube. The skin surrounding the cholecystostomy tube was anesthetized with 1% lidocaine with epinephrine. The external portion of the cholecystostomy tube was  cut and cannulated with a short Amplatz wire which was advanced through the tube and coiled within the gallbladder lumen. Under fluoroscopic guidance, the existing cholecystostomy tube was exchanged for a Kumpe catheter which was manipulated into the more central aspect of the gallbladder lumen. Next, under intermittent fluoroscopic guidance, the existing 10 [place] cholecystostomy tube was exchanged for a new, slightly larger now 76 [place] cholecystostomy tube which was repositioned into the more central aspect of the gallbladder lumen. Contrast injection confirms appropriate positioning and functionality of the cholecystomy tube. The cholecystostomy tube was flushed with a small amount of saline and reconnected to a gravity bag. The cholecystostomy tube was secured with an interrupted suture and a Stat Lock device. A dressing was applied. The patient tolerated the procedure well without immediate postprocedural complication. FINDINGS: Preprocedural spot fluoroscopic image demonstrates unchanged positioning of cholecystostomy tube with end coiled and locked over the expected location of the fundus of the gallbladder. Contrast injection demonstrates passage of contrast through the gallbladder and cystic duct to the level of the CBD. There is reflux of contrast along the catheter tract to the skin entrance surface site. Persistent filling defects are seen within the neck of the gallbladder, the cystic duct as well as two discrete filling defects within the distal CBD, findings compatible with cholelithiasis and choledocholithiasis. After fluoroscopic guided exchange, the new, slightly larger, now 26 [place] cholecystostomy tube is more ideally positioned with end coiled and locked within the central aspect of the gallbladder lumen. Post exchange cholangiogram demonstrates appropriate positioning and functionality of the new cholecystostomy tube. IMPRESSION: 1. Successful fluoroscopic guided exchange,  repositioning and  up sizing of now 26 French cholecystostomy tube. 2. Cholelithiasis and choledocholithiasis including two nonocclusive gallstones within the distal aspect of the CBD. PLAN: - The patient's cholecystostomy tube was reconnected to a gravity bag. - Assuming interval cholecystectomy is not pursued, recommend fluoroscopic guided exchange in 2 months (mid January 2024). Electronically Signed   By: Sandi Mariscal M.D.   On: 11/19/2021 17:44   No results for input(s): "WBC", "HGB", "HCT", "PLT" in the last 72 hours.  No results for input(s): "NA", "K", "CL", "CO2", "GLUCOSE", "BUN", "CREATININE", "CALCIUM" in the last 72 hours.   Intake/Output Summary (Last 24 hours) at 11/20/2021 0827 Last data filed at 11/20/2021 0733 Gross per 24 hour  Intake 1158 ml  Output 75 ml  Net 1083 ml         Physical Exam: Vital Signs Blood pressure 126/86, pulse 63, temperature 98.3 F (36.8 C), temperature source Axillary, resp. rate 18, height 6' (1.829 m), weight 116.2 kg, SpO2 97 %.   General: No acute distress,lying in bed Mood and affect are appropriate, pleasant Heart: Regular rate and rhythm no rubs murmurs or extra sounds Lungs: Clear to auscultation, breathing unlabored, no rales or wheezes Abdomen: normoactive bowel sounds, soft nontender to palpation, nondistended Chole drain site without erythema or drainage , sutures intact , bag has clear brown fluid  Extremities: No clubbing, cyanosis, or edema Skin: Warm, Dry No evidence of breakdown, no evidence of rash   GU: foley with yellow urine Neuro: Alert to person and states "you are my doctor" Sensation intact to LT in all 4 extremities Musculoskeletal: Strength 4+/5 throughout  No joint swelling  noted    Assessment/Plan: 1. Functional deficits which require 3+ hours per day of interdisciplinary therapy in a comprehensive inpatient rehab setting. Physiatrist is providing close team supervision and 24 hour management of active medical problems  listed below. Physiatrist and rehab team continue to assess barriers to discharge/monitor patient progress toward functional and medical goals  Care Tool:  Bathing    Body parts bathed by patient: Chest, Face, Left arm, Right arm, Front perineal area, Right upper leg, Left upper leg   Body parts bathed by helper: Buttocks, Right lower leg, Left lower leg, Abdomen     Bathing assist Assist Level: Moderate Assistance - Patient 50 - 74%     Upper Body Dressing/Undressing Upper body dressing   What is the patient wearing?: Pull over shirt    Upper body assist Assist Level: Set up assist    Lower Body Dressing/Undressing Lower body dressing      What is the patient wearing?: Pants     Lower body assist Assist for lower body dressing: Minimal Assistance - Patient > 75%     Toileting Toileting    Toileting assist Assist for toileting: Maximal Assistance - Patient 25 - 49%     Transfers Chair/bed transfer  Transfers assist     Chair/bed transfer assist level: Contact Guard/Touching assist Chair/bed transfer assistive device: Armrests, Programmer, multimedia   Ambulation assist      Assist level: Minimal Assistance - Patient > 75% Assistive device: Walker-rolling Max distance: 369ft   Walk 10 feet activity   Assist     Assist level: Contact Guard/Touching assist Assistive device: Walker-rolling   Walk 50 feet activity   Assist    Assist level: Contact Guard/Touching assist Assistive device: Walker-rolling    Walk 150 feet activity   Assist Walk 150 feet activity did not  occur: Safety/medical concerns  Assist level: Contact Guard/Touching assist Assistive device: Walker-rolling    Walk 10 feet on uneven surface  activity   Assist     Assist level: Minimal Assistance - Patient > 75% Assistive device: Walker-rolling   Wheelchair     Assist Is the patient using a wheelchair?: Yes Type of Wheelchair: Manual     Wheelchair assist level: Dependent - Patient 0% Max wheelchair distance: >146ft    Wheelchair 50 feet with 2 turns activity    Assist        Assist Level: Dependent - Patient 0%   Wheelchair 150 feet activity     Assist      Assist Level: Dependent - Patient 0%   Blood pressure 126/86, pulse 63, temperature 98.3 F (36.8 C), temperature source Axillary, resp. rate 18, height 6' (1.829 m), weight 116.2 kg, SpO2 97 %.   Medical Problem List and Plan: 1. 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 acute respiratory failure, cholecystitis, and urosepsis.             -patient may shower             -ELOS/Goals: 10-14 days S,              -Continue CIR with PT/OT, order SLP 2.  Antithrombotics: -DVT/anticoagulation:  Pharmaceutical: Lovenox (start 40 mg daily)             -antiplatelet therapy: none 3. Pain Management: Has chronic back and shoulder pain.   -Continue tylenol and robaxin PRN 4. Mood/Behavior/Sleep: LCSW to evaluate and provide emotional support                        -antipsychotic agents: n/a             -continue Atarax 10 mg TID prn anxiety 5. Neuropsych/cognition: This patient is not capable of making decisions on his own behalf. 6. Skin/Wound Care: Routine skin care checks             -dysphagia 3/thin liquids; carb modified  -11/9 Consult for SLP  7. Fluids/Electrolytes/Nutrition: Routine Is and Os and follow-up chemistries 8: SVT: converted to SR, continue metoprolol 25 mg BID 9: Sepsis sue to GN UTI: completed Augmentin 11/11 10: Cholecystitis s/p cholecystostomy tube placement; no current indication for cholecystectomy S/p IR exchange of chole drain  -routine drain site care and record output daily 11: Elevated LFTs: trending downward; follow-up CMP tomorrow/weekly. Continue to hold statin 12: Urinary  retention: indwelling Foley catheter, just finished abx for UTI, start flomax tonight, voiding trial in am  13: DM-2: CBGs q AC              -continue Semglee 12 units BID             -continue SSI  -11/9 CBGs fairly well controlled, continue Semglee 12 BID, continue to monitor  CBG (last 3)  Recent Labs    11/19/21 1948 11/19/21 2047 11/20/21 0627  GLUCAP 176* 196* 134*   Bump Semglee to 15 U BID  14: Hypertension: monitor TID and prn              Vitals:   11/20/21 0342 11/20/21 0400  BP: 126/86   Pulse: 63   Resp: 18   Temp:  98.3 F (36.8 C)  SpO2: 97%   BP ok this am, mild  systolic elevation yesterday pm will cont to monitor on Lopressor 25mg  BID  11/16 well controlled, continue to monitor 15: GIB: 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

## 2021-11-20 NOTE — Progress Notes (Signed)
Physical Therapy Session Note  Patient Details  Name: Troy Randall MRN: [id number] Date of Birth: 1960-12-29  Today's Date: 11/20/2021 PT Individual Time: 4098-1191 PT Individual Time Calculation (min): 27 min   Short Term Goals: Week 1:  PT Short Term Goal 1 (Week 1): Pt will perform supine<>sit with CGA consistently PT Short Term Goal 1 - Progress (Week 1): Met (using hospital bed features per home set-up) PT Short Term Goal 2 (Week 1): Pt will perform sit<>stands using LRAD with CGA consistently PT Short Term Goal 2 - Progress (Week 1): Met PT Short Term Goal 3 (Week 1): Pt will perform bed<>chair transfers using LRAD with CGA PT Short Term Goal 3 - Progress (Week 1): Met PT Short Term Goal 4 (Week 1): Pt will ambulate at least 60f using LRAD with CGA PT Short Term Goal 4 - Progress (Week 1): Met PT Short Term Goal 5 (Week 1): Pt will navigate 2 steps using LRAD with CGA PT Short Term Goal 5 - Progress (Week 1): Met Week 2:  PT Short Term Goal 1 (Week 2): = to LTGs based on ELOS  Skilled Therapeutic Interventions/Progress Updates:  Ambulation/gait training;Community reintegration;DME/adaptive equipment instruction;Neuromuscular re-education;Psychosocial support;Stair training;Wheelchair propulsion/positioning;UE/LE Strength taining/ROM;Balance/vestibular training;Discharge planning;Functional electrical stimulation;Pain management;Skin care/wound management;Therapeutic Activities;UE/LE Coordination activities;Cognitive remediation/compensation;Disease management/prevention;Functional mobility training;Patient/family education;Splinting/orthotics;Therapeutic Exercise;Visual/perceptual remediation/compensation   Patient seated on EOB on entrance to room. Wife present and has assisted pt with dressing for day. Patient alert and agreeable to PT session.   Patient with no pain complaint at start of session.  Therapeutic Activity/ NMR: Transfers: Pt performed sit<>stand and stand pivot  transfers throughout session with overall supervision and demos impulsivity as well as inability to follow instructions provided prior to performance. Requires push from seat using BUE in order to power up. Guided in 5xSTS and completes in 44.91 sec requiring simple vc to stand and then to sit in order to complete.  Provided general verbal cues for increased forward lean.  NMR performed for improvements in motor control and coordination, balance, sequencing, judgement, and self confidence/ efficacy in performing all aspects of mobility at highest level of independence.   Gait Training:  Pt ambulated ~150' x2 using RW with CGA/ MinA with close w/c follow. Demonstrated intermittent decreased step height with LLE, slow processing and slow to execute maneuver around large obstacles in hallway, especially to R side. Pt also demos lift of RW during ambulation to maneuver low object on floor rather than to walk around.  Provided vc/ tc for increased step height and managing RW.  Pt returns to L sidelying with supervision requiring cueing to remind pt to move higher up in bed.   Patient sidelying at end of session with brakes locked, bed alarm set, and all needs within reach. Oriented to time and next therapy session to begin with ST in 30 min.    Therapy Documentation Precautions:  Precautions Precautions: Fall Precaution Comments: R upper quadrant drain Restrictions Weight Bearing Restrictions: No Other Position/Activity Restrictions: pt unable to sit on bottom for extended periods of time due to pain (even on geomat) General:   Vital Signs:   Pain:  No pain complaint throughout session.   Therapy/Group: Individual Therapy  JAlger SimonsPT, DPT, CSRS 11/20/2021, 8:29 AM

## 2021-11-21 DIAGNOSIS — G0400 Acute disseminated encephalitis and encephalomyelitis, unspecified: Secondary | ICD-10-CM | POA: Diagnosis not present

## 2021-11-21 LAB — GLUCOSE, CAPILLARY
Glucose-Capillary: 99 mg/dL (ref 70–99)
Glucose-Capillary: 127 mg/dL — ABNORMAL HIGH (ref 70–99)
Glucose-Capillary: 146 mg/dL — ABNORMAL HIGH (ref 70–99)
Glucose-Capillary: 158 mg/dL — ABNORMAL HIGH (ref 70–99)

## 2021-11-21 MED ORDER — BETHANECHOL CHLORIDE 10 MG PO TABS
10.0000 mg | ORAL_TABLET | Freq: Three times a day (TID) | ORAL | Status: DC
  Administered 2021-11-21 – 2021-11-25 (×13): 10 mg via ORAL
  Filled 2021-11-21 (×13): qty 1

## 2021-11-21 MED ORDER — LIDOCAINE HCL URETHRAL/MUCOSAL 2 % EX GEL
1.0000 | CUTANEOUS | Status: DC | PRN
  Administered 2021-11-21 – 2021-11-24 (×6): 1 via URETHRAL
  Filled 2021-11-21 (×9): qty 6

## 2021-11-21 NOTE — Progress Notes (Signed)
Physical Therapy Session Note  Patient Details  Name: Troy Randall MRN: [id number] Date of Birth: 11-24-1960  Today's Date: 11/21/2021 PT Individual Time: 1000-1100 PT Individual Time Calculation (min): 60 min   Short Term Goals: Week 1:  PT Short Term Goal 1 (Week 1): Pt will perform supine<>sit with CGA consistently PT Short Term Goal 1 - Progress (Week 1): Met (using hospital bed features per home set-up) PT Short Term Goal 2 (Week 1): Pt will perform sit<>stands using LRAD with CGA consistently PT Short Term Goal 2 - Progress (Week 1): Met PT Short Term Goal 3 (Week 1): Pt will perform bed<>chair transfers using LRAD with CGA PT Short Term Goal 3 - Progress (Week 1): Met PT Short Term Goal 4 (Week 1): Pt will ambulate at least 48f using LRAD with CGA PT Short Term Goal 4 - Progress (Week 1): Met PT Short Term Goal 5 (Week 1): Pt will navigate 2 steps using LRAD with CGA PT Short Term Goal 5 - Progress (Week 1): Met Week 2:  PT Short Term Goal 1 (Week 2): = to LTGs based on ELOS   Skilled Therapeutic Interventions/Progress Updates:   Pt received supine in bed and agreeable to PT. Supine>sit transfer with supervision assist and cues for use for bed rails for safety.   Gait training in hall with CGA 2 x 1630fto and from room. Additional gait training with RW with CGA/min assist while performed visual scanning to name objects on the L and R side of the hall.   Dynamic gait training to perform figure 8, x 4 with RW. Stepping over 10# weights x 8 with cues for AD management and safety in turning to prevent loss over control of AD and improve navigation of obstacles in floor. Max cues for attention to task and safety awareness.  Pt performed bean bag toss to target then picking up bean bags from stool height. Attempted to have pt retrieve bean bags in specific order, but pt cannot retain more than 1 at a time. Pt performed sorting task to retrieve 1 bean bag from 3 options x 6. Pt  unable to sort 3 colors or 2 colors simultaneously. Pt then asked to locate objects from field of 6. Pt able to identify each, but unable to sort or locate more than 1 object at a time.     Pt returned to room and performed s transfer to bed with CGA and RW. Sit>supine completed with supervision assist, due to poor positioning in bed, and left supine in bed with call bell in reach and all needs met.        Therapy Documentation Precautions:  Precautions Precautions: Fall Precaution Comments: R upper quadrant drain Restrictions Weight Bearing Restrictions: No Other Position/Activity Restrictions: pt unable to sit on bottom for extended periods of time due to pain (even on geomat)  Pain:   denies    Therapy/Group: Individual Therapy  AuLorie Phenix1/17/2023, 1:37 PM

## 2021-11-21 NOTE — Progress Notes (Signed)
Occupational Therapy Weekly Progress Note  Patient Details  Name: Troy Randall MRN: [id number] Date of Birth: [birth year]  Beginning of progress report period: November 12, 2021 End of progress report period: November 21, 2021  Today's Date: 11/21/2021 OT Individual Time AM Session: 2482-5003 OT Individual Time Calculation (min): 45 min  OT Individual Time PM Session: 1300-1400 OT Individual Time Calculation (min): 60 min   Patient has met 4 of 5 short term goals.  Pt is demonstrating increased independence in U/LB dressing (supervision), U/LB bathing min A, and feeding (supervision). Pt continues to present with deficits in completing posterior peri-care following toileting requiring mod A.   Patient continues to demonstrate the following deficits: muscle weakness, decreased motor planning, decreased motor planning, decreased initiation, decreased attention, decreased awareness, decreased problem solving, decreased safety awareness, decreased memory, and delayed processing, and decreased standing balance and decreased balance strategies and therefore will continue to benefit from skilled OT intervention to enhance overall performance with BADL and iADL.  Patient progressing toward long term goals. Plan of care revisions: LTG: Pt will perform toileting task (3/3 steps) with supervision. Downgraded to Min A d/t Pt cognitive status and awareness deficits. Pt anticipated to require Min A at d/c t ensure peri-care is complete. Wife is able to provide Min A at time of d/c.   OT Short Term Goals Week 1:  OT Short Term Goal 1 (Week 1): Pt will complete UB dressing with supervision OT Short Term Goal 1 - Progress (Week 1): Met OT Short Term Goal 2 (Week 1): Pt will complete LB dressing with min A OT Short Term Goal 2 - Progress (Week 1): Met OT Short Term Goal 3 (Week 1): Pt will completed U/LB bathing wiht min A OT Short Term Goal 3 - Progress (Week 1): Met OT Short Term Goal 4 (Week 1): Pt will  complete toileting with min A OT Short Term Goal 4 - Progress (Week 1): Partly met (Pt requires min-mod A for posterior peri-care following bowel movements to ensure bottom is fully cleaned) OT Short Term Goal 5 - Progress (Week 1): Met Week 2:  OT Short Term Goal 1 (Week 2): STG=LTG d/t Pt length of stay  Skilled Therapeutic Interventions/Progress Updates:     AM Session: Pt was received resting in bed with wife present  in good spirits and willing to participate in skilled therapy session. Pt receptive to completing a shower this AM reporting no pain during session.   Pt supine>EOB supervision HOB elevated. Pt sit>stand and ambulation to bathroom close supervision RW. Pt attempted to use restroom prior to shower but no continent void/bowel movement at this time.  Pt completed U/LB bathing with min A to wash bottom and bilateral lower legs. Pt able to follow direct verbal cues to wash body parts. Pt less perseverative this session and demonstrating improvement in initiating washing next body part. Pt noted to have one instance of frustration during shower with Pt able to be redirected to bathing with min verbal cues and emotional support.   Pt completed U/LB dressing seated EOB with close supervision. Pt demonstrated improved problem solving when weaving feet into pants, improved awareness, and increased attention throughout ADLs today.   Pt back to bed close supervision. Pt was left resting in bed with wife present, call bell in reach, and all needs met.   PM Session: Pt received resting in bed with brother present in room. Pt in good spirits and receptive to skilled OT session reporting 0/10 pain.  Pt supine>EOB CGA. Pt sit>stand and ambulation to restroom CGA. Pt attempted to use restroom prior to shower but no continent void/bowel movement at this time. Pt able to follow mod multimodal cues to complete clothing management during toileting tasks. Pt transported to ADL apartment for OT session  with a focus on addressing deficits in functional mobility, problem solving, attention, and IADLs.   Pt completed seated towel sorting task sitting at table with mod verbal cues. Pt initially given combination of towels and wash clothes with instructions to sort them into separate baskets. Pt began task placing all items in one basket. Task simplified by providing Pt one towel or washcloth at a time with Pt separating into piles. Pt able to complete tasks with moderate verbal cues to support errorless learning.   Pt completed towel folding task with washcloths seated at table. Pt provided demonstration when folding each towel. Pt utilizing unique method to fold towels, but completing task at overall mod A.   Pt followed two-step verbal cues to identify fruits from bowl and place them on tray with min verbal cues for attention and to repeat directions.   Pt completed grocery task retrieving food items placed around kitchen on counter top and placing them into overhead cabinet. Pt demonstrated improved safety awareness and problem solving for IADL tasks when manipulating food items while ambulating with RW. Pt able to slide kitchen items on counter top with CGA. Pt reported pain in back and was provided rest break in wc.  Pt completed silverware sorting activity wc level. Pt able to correctly identify spoon from fork and place them into correct space in silverware container with mod A. Pt was transported back to room in wc and left resting in his bed with bed alarm on, call bell in reach, and all needs met.   Therapy Documentation Precautions:  Precautions Precautions: Fall Precaution Comments: R upper quadrant drain Restrictions Weight Bearing Restrictions: No Other Position/Activity Restrictions: pt unable to sit on bottom for extended periods of time due to pain (even on geomat) General:      Pain: Pain Assessment Pain Score: 4  ADL: ADL Eating: Minimal assistance Grooming: Minimal  assistance, Maximal cueing Where Assessed-Grooming: Edge of bed Upper Body Bathing: Maximal cueing, Minimal assistance Where Assessed-Upper Body Bathing: Shower Lower Body Bathing: Maximal cueing, Moderate assistance Where Assessed-Lower Body Bathing: Shower Upper Body Dressing: Maximal cueing, Minimal assistance Where Assessed-Upper Body Dressing: Edge of bed Lower Body Dressing: Maximal cueing, Moderate assistance Where Assessed-Lower Body Dressing: Edge of bed Toileting: Dependent Where Assessed-Toileting: Bed level Toilet Transfer: Minimal assistance Science writer: Geophysical data processor: Maximal cueing, Minimal assistance Social research officer, government Method: Heritage manager: Radio broadcast assistant   Therapy/Group: Individual Therapy  Janey Genta 11/21/2021, 10:16 AM

## 2021-11-21 NOTE — Progress Notes (Signed)
PROGRESS NOTE   Subjective/Complaints:  Voiding continently but needing occ cath, discussed addition of bladder stimulant medication with goal of avoiding I/O cath  Chole tube draining well   Review of Systems  Constitutional:  Negative for chills and fever.  HENT:  Negative for congestion.   Respiratory:  Negative for shortness of breath.   Cardiovascular:  Negative for chest pain and palpitations.  Gastrointestinal:  Negative for abdominal pain, constipation, diarrhea, nausea and vomiting.  Genitourinary: Negative.   Musculoskeletal:  Positive for back pain.  Neurological:  Negative for weakness and headaches.  Psychiatric/Behavioral:  Positive for memory loss.     Objective:   IR EXCHANGE BILIARY DRAIN  Result Date: 11/19/2021 INDICATION: History of acute cholecystitis, post ultrasound fluoroscopic guided cholecystostomy tube placement on 09/07/2021. Patient with excessive pericatheter leakage and as such returns today for cholangiogram with potential cholecystostomy tube exchange and up sizing. EXAM: FLUOROSCOPIC GUIDED CHOLECYSTOSTOMY TUBE EXCHANGE COMPARISON:  Cholangiogram via existing cholecystostomy tube-11/06/2021; cholecystostomy tube exchange-10/24/2021 MEDICATIONS: None ANESTHESIA/SEDATION: None CONTRAST:  7mL OMNIPAQUE IOHEXOL 300 MG/ML SOLN - administered into the gallbladder lumen. FLUOROSCOPY TIME:  2 minutes, 24 seconds (142 mGy) COMPLICATIONS: None immediate. PROCEDURE: The patient was positioned supine on the fluoroscopy table. The external portion of the existing cholecystostomy tube as well as the surrounding skin was prepped and draped in usual sterile fashion. A time-out was performed prior to the initiation of the procedure. A preprocedural spot fluoroscopic image was obtained of the right upper abdominal quadrant existing cholecystostomy tube. The skin surrounding the cholecystostomy tube was anesthetized  with 1% lidocaine with epinephrine. The external portion of the cholecystostomy tube was cut and cannulated with a short Amplatz wire which was advanced through the tube and coiled within the gallbladder lumen. Under fluoroscopic guidance, the existing cholecystostomy tube was exchanged for a Kumpe catheter which was manipulated into the more central aspect of the gallbladder lumen. Next, under intermittent fluoroscopic guidance, the existing 10 [place] cholecystostomy tube was exchanged for a new, slightly larger now 15 [place] cholecystostomy tube which was repositioned into the more central aspect of the gallbladder lumen. Contrast injection confirms appropriate positioning and functionality of the cholecystomy tube. The cholecystostomy tube was flushed with a small amount of saline and reconnected to a gravity bag. The cholecystostomy tube was secured with an interrupted suture and a Stat Lock device. A dressing was applied. The patient tolerated the procedure well without immediate postprocedural complication. FINDINGS: Preprocedural spot fluoroscopic image demonstrates unchanged positioning of cholecystostomy tube with end coiled and locked over the expected location of the fundus of the gallbladder. Contrast injection demonstrates passage of contrast through the gallbladder and cystic duct to the level of the CBD. There is reflux of contrast along the catheter tract to the skin entrance surface site. Persistent filling defects are seen within the neck of the gallbladder, the cystic duct as well as two discrete filling defects within the distal CBD, findings compatible with cholelithiasis and choledocholithiasis. After fluoroscopic guided exchange, the new, slightly larger, now 42 [place] cholecystostomy tube is more ideally positioned with end coiled and locked within the central aspect of the gallbladder lumen. Post exchange cholangiogram demonstrates appropriate positioning and functionality  of the new  cholecystostomy tube. IMPRESSION: 1. Successful fluoroscopic guided exchange, repositioning and up sizing of now 24 French cholecystostomy tube. 2. Cholelithiasis and choledocholithiasis including two nonocclusive gallstones within the distal aspect of the CBD. PLAN: - The patient's cholecystostomy tube was reconnected to a gravity bag. - Assuming interval cholecystectomy is not pursued, recommend fluoroscopic guided exchange in 2 months (mid January 2024). Electronically Signed   By: Sandi Mariscal M.D.   On: 11/19/2021 17:44   No results for input(s): "WBC", "HGB", "HCT", "PLT" in the last 72 hours.  Recent Labs    11/20/21 0849  NA 139  K 3.4*  CL 110  CO2 22  GLUCOSE 161*  BUN 9  CREATININE 0.83  CALCIUM 8.5*     Intake/Output Summary (Last 24 hours) at 11/21/2021 0904 Last data filed at 11/21/2021 0741 Gross per 24 hour  Intake 577 ml  Output 1225 ml  Net -648 ml         Physical Exam: Vital Signs Blood pressure 91/78, pulse 65, temperature 98 F (36.7 C), temperature source Oral, resp. rate 16, height 6' (1.829 m), weight 117.8 kg, SpO2 97 %.   General: No acute distress,lying in bed Mood and affect are appropriate, pleasant Heart: Regular rate and rhythm no rubs murmurs or extra sounds Lungs: Clear to auscultation, breathing unlabored, no rales or wheezes Abdomen: normoactive bowel sounds, soft nontender to palpation, nondistended Chole drain site without erythema or drainage , sutures intact , bag has clear brown fluid  Extremities: No clubbing, cyanosis, or edema Skin: Warm, Dry No evidence of breakdown, no evidence of rash   GU: foley with yellow urine Neuro: Alert to person and states "you are my doctor" Sensation intact to LT in all 4 extremities Musculoskeletal: Strength 4+/5 throughout  No joint swelling  noted    Assessment/Plan: 1. Functional deficits which require 3+ hours per day of interdisciplinary therapy in a comprehensive inpatient rehab  setting. Physiatrist is providing close team supervision and 24 hour management of active medical problems listed below. Physiatrist and rehab team continue to assess barriers to discharge/monitor patient progress toward functional and medical goals  Care Tool:  Bathing    Body parts bathed by patient: Chest, Face, Left arm, Right arm, Front perineal area, Right upper leg, Left upper leg   Body parts bathed by helper: Buttocks, Right lower leg, Left lower leg, Abdomen     Bathing assist Assist Level: Moderate Assistance - Patient 50 - 74%     Upper Body Dressing/Undressing Upper body dressing   What is the patient wearing?: Pull over shirt    Upper body assist Assist Level: Set up assist    Lower Body Dressing/Undressing Lower body dressing      What is the patient wearing?: Pants     Lower body assist Assist for lower body dressing: Minimal Assistance - Patient > 75%     Toileting Toileting    Toileting assist Assist for toileting: Maximal Assistance - Patient 25 - 49%     Transfers Chair/bed transfer  Transfers assist     Chair/bed transfer assist level: Contact Guard/Touching assist Chair/bed transfer assistive device: Armrests, Programmer, multimedia   Ambulation assist      Assist level: Minimal Assistance - Patient > 75% Assistive device: Walker-rolling Max distance: 354ft   Walk 10 feet activity   Assist     Assist level: Contact Guard/Touching assist Assistive device: Walker-rolling   Walk 50 feet activity   Assist  Assist level: Contact Guard/Touching assist Assistive device: Walker-rolling    Walk 150 feet activity   Assist Walk 150 feet activity did not occur: Safety/medical concerns  Assist level: Contact Guard/Touching assist Assistive device: Walker-rolling    Walk 10 feet on uneven surface  activity   Assist     Assist level: Minimal Assistance - Patient > 75% Assistive device: Walker-rolling    Wheelchair     Assist Is the patient using a wheelchair?: Yes Type of Wheelchair: Manual    Wheelchair assist level: Dependent - Patient 0% Max wheelchair distance: >151ft    Wheelchair 50 feet with 2 turns activity    Assist        Assist Level: Dependent - Patient 0%   Wheelchair 150 feet activity     Assist      Assist Level: Dependent - Patient 0%   Blood pressure 91/78, pulse 65, temperature 98 F (36.7 C), temperature source Oral, resp. rate 16, height 6' (1.829 m), weight 117.8 kg, SpO2 97 %.   Medical Problem List and Plan: 1. 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 acute respiratory failure, cholecystitis, and urosepsis.             -patient may shower             -ELOS/Goals: 10-14 days S,              -Continue CIR with PT/OT, order SLP 2.  Antithrombotics: -DVT/anticoagulation:  Pharmaceutical: Lovenox (start 40 mg daily)             -antiplatelet therapy: none 3. Pain Management: Has chronic back and shoulder pain.   -Continue tylenol and robaxin PRN 4. Mood/Behavior/Sleep: LCSW to evaluate and provide emotional support                        -antipsychotic agents: n/a             -continue Atarax 10 mg TID prn anxiety 5. Neuropsych/cognition: This patient is not capable of making decisions on his own behalf. 6. Skin/Wound Care: Routine skin care checks             -dysphagia 3/thin liquids; carb modified  -11/9 Consult for SLP  7. Fluids/Electrolytes/Nutrition: Routine Is and Os and follow-up chemistries 8: SVT: converted to SR, continue metoprolol 25 mg BID 9: Sepsis sue to GN UTI: completed Augmentin 11/11 10: Cholecystitis s/p cholecystostomy tube placement; no current indication for cholecystectomy S/p IR exchange of chole drain  -routine drain site care and record output daily 11: Elevated  LFTs: trending downward; follow-up CMP tomorrow/weekly. Continue to hold statin 12: Urinary retention: improving , still with occ I/O cath, cont flomax , add urecholine , rec Uro f/u as OP   13: DM-2: CBGs q AC              -continue Semglee 12 units BID             -continue SSI  -11/9 CBGs fairly well controlled, continue Semglee 12 BID, continue to monitor  CBG (last 3)  Recent Labs    11/20/21 1657 11/20/21 2017 11/21/21 0641  GLUCAP 156* 164* 99   Bump Semglee to 15 U BID, improving 11/17, may need hs snack  14: Hypertension: monitor TID and prn              Vitals:  11/20/21 2010 11/21/21 0538  BP: (!) 146/77 91/78  Pulse: 73 65  Resp: 16 16  Temp: 98.2 F (36.8 C) 98 F (36.7 C)  SpO2: 98% 97%  BP ok this am, mild systolic elevation yesterday pm will cont to monitor on Lopressor 25mg  BID  11/16 well controlled, continue to monitor 15: GIB: 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

## 2021-11-21 NOTE — Progress Notes (Signed)
Physical Therapy Session Note  Patient Details  Name: Troy Randall MRN: [id number] Date of Birth: April 17, 1960  Today's Date: 11/21/2021 PT Individual Time: 1415-1500 PT Individual Time Calculation (min): 45 min   Short Term Goals: Week 1:  PT Short Term Goal 1 (Week 1): Pt will perform supine<>sit with CGA consistently PT Short Term Goal 1 - Progress (Week 1): Met (using hospital bed features per home set-up) PT Short Term Goal 2 (Week 1): Pt will perform sit<>stands using LRAD with CGA consistently PT Short Term Goal 2 - Progress (Week 1): Met PT Short Term Goal 3 (Week 1): Pt will perform bed<>chair transfers using LRAD with CGA PT Short Term Goal 3 - Progress (Week 1): Met PT Short Term Goal 4 (Week 1): Pt will ambulate at least 52f using LRAD with CGA PT Short Term Goal 4 - Progress (Week 1): Met PT Short Term Goal 5 (Week 1): Pt will navigate 2 steps using LRAD with CGA PT Short Term Goal 5 - Progress (Week 1): Met Week 2:  PT Short Term Goal 1 (Week 2): = to LTGs based on ELOS  Skilled Therapeutic Interventions/Progress Updates:   Pt received supine in bed and agreeable to PT. Supine>sit transfer with supervision assist and cues for use of bed features to push to sitting. [facility]. [facility] Performed standing balance while engage cognitive task of checkers. Max cues for awareness of rules, use of only one color, and alternating turns between pt and PT.   Pt performed gait training through hall with RW and CGA  x 2226fwhile performing verbal list to name as many automobile models as possible. Pt able to name 6 +2 models with min cues from PT for additional 2 to name cars that family members drive. Pt then unable to recall additional models regardless of cues from PT   Pt then perform dynamic balance task to toss ball off trampoline sitting x 15 and then standing 15x3. Pt noted to stop at 16 on each bout,  when questioned, pt reports, "you can always do one more" .   RN present upon completion asking pt to attempt to void to prevent in/out catheterization. Ambulatory transfer to toilet in bathroom with RW and CGA. Pt attempted urination but unsuccessful. Pt returned to room and performed ambulatory transfer to bed with RW and CGA. Sit>supine completed with supervision assist, and left supine in bed with call bell in reach and all needs met.        Therapy Documentation Precautions:  Precautions Precautions: Fall Precaution Comments: R upper quadrant drain Restrictions Weight Bearing Restrictions: No Other Position/Activity Restrictions: pt unable to sit on bottom for extended periods of time due to pain (even on geomat)    Vital Signs: Therapy Vitals Temp: 98 F (36.7 C) Pulse Rate: 63 Resp: 18 BP: 138/63 Patient Position (if appropriate): Lying Oxygen Therapy SpO2: 99 % O2 Device: Room Air Pain: denies    Therapy/Group: Individual Therapy  AuLorie Phenix1/17/2023, 3:28 PM

## 2021-11-21 NOTE — Plan of Care (Signed)
  Problem: RH Toileting Goal: LTG Patient will perform toileting task (3/3 steps) with assistance level (OT) Description: LTG: Patient will perform toileting task (3/3 steps) with assistance level (OT)  Flowsheets (Taken 11/21/2021 1245) LTG: Pt will perform toileting task (3/3 steps) with assistance level: Minimal Assistance - Patient > 75% Note: Goal downgraded to Min A d/t Pt cognitive status and awareness deficits. Pt anticipated to require Min A at d/c t ensure peri-care is complete. Wife is able to provide Min A at time of d/c.  Bonnell Public 11/21/2021

## 2021-11-22 LAB — GLUCOSE, CAPILLARY
Glucose-Capillary: 104 mg/dL — ABNORMAL HIGH (ref 70–99)
Glucose-Capillary: 141 mg/dL — ABNORMAL HIGH (ref 70–99)
Glucose-Capillary: 196 mg/dL — ABNORMAL HIGH (ref 70–99)
Glucose-Capillary: 150 mg/dL — ABNORMAL HIGH (ref 70–99)

## 2021-11-22 NOTE — Progress Notes (Signed)
Physical Therapy Session Note  Patient Details  Name: Troy Randall MRN: [id number] Date of Birth: 09-09-1960  Today's Date: 11/22/2021 PT Individual Time: 1015-1110 PT Individual Time Calculation (min): 55 min   Short Term Goals: Week 1:  PT Short Term Goal 1 (Week 1): Pt will perform supine<>sit with CGA consistently PT Short Term Goal 1 - Progress (Week 1): Met (using hospital bed features per home set-up) PT Short Term Goal 2 (Week 1): Pt will perform sit<>stands using LRAD with CGA consistently PT Short Term Goal 2 - Progress (Week 1): Met PT Short Term Goal 3 (Week 1): Pt will perform bed<>chair transfers using LRAD with CGA PT Short Term Goal 3 - Progress (Week 1): Met PT Short Term Goal 4 (Week 1): Pt will ambulate at least 74f using LRAD with CGA PT Short Term Goal 4 - Progress (Week 1): Met PT Short Term Goal 5 (Week 1): Pt will navigate 2 steps using LRAD with CGA PT Short Term Goal 5 - Progress (Week 1): Met  Skilled Therapeutic Interventions/Progress Updates:  Pt was seen bedside in the am. Pt performed bed with mobility with side rail and S with verbal cues. Pt maintained sitting balance on edge of bed with S. Pt performed multiple sit to stand transfers with S and multiple stand pivot transfers with/without assistive device and S to c/g. Pt ambulated multiple distances with and without assistive device. Pt's level of assistance fluctuated from c/g to min A depending on fatigue and device utilized. Pt ascended/descended 4 stairs with L rail and c/g with verbal cues. Pt ascended/descended 1 step with R rail and c/g to min A with verbal cues. Pt ambulated back to room with rolling walker and c/g. Pt left sitting up in bed with bed alarm on and all needs within reach.   Therapy Documentation Precautions:  Precautions Precautions: Fall Precaution Comments: R upper quadrant drain Restrictions Weight Bearing Restrictions: No Other Position/Activity Restrictions: pt unable to  sit on bottom for extended periods of time due to pain (even on geomat) General:    Pain: Pain Assessment Pain Scale: 0-10 Pain Score: 0-No pain  Therapy/Group: Individual Therapy  MDub Amis11/18/2023, 12:22 PM

## 2021-11-22 NOTE — Progress Notes (Signed)
Physical Therapy Session Note  Patient Details  Name: Troy Randall MRN: [id number] Date of Birth: October 05, 1960  Today's Date: 11/22/2021 PT Individual Time: 0800-0858 PT Individual Time Calculation (min): 58 min   Short Term Goals: Week 1:  PT Short Term Goal 1 (Week 1): Pt will perform supine<>sit with CGA consistently PT Short Term Goal 1 - Progress (Week 1): Met (using hospital bed features per home set-up) PT Short Term Goal 2 (Week 1): Pt will perform sit<>stands using LRAD with CGA consistently PT Short Term Goal 2 - Progress (Week 1): Met PT Short Term Goal 3 (Week 1): Pt will perform bed<>chair transfers using LRAD with CGA PT Short Term Goal 3 - Progress (Week 1): Met PT Short Term Goal 4 (Week 1): Pt will ambulate at least 32f using LRAD with CGA PT Short Term Goal 4 - Progress (Week 1): Met PT Short Term Goal 5 (Week 1): Pt will navigate 2 steps using LRAD with CGA PT Short Term Goal 5 - Progress (Week 1): Met  Skilled Therapeutic Interventions/Progress Updates:  Pt was seen bedside in the am sitting up on edge of bed with wife at bedside, S level. Pt transferred sit to stand with c/g from edge of bed with rolling walker. Pt ambulated to rehab gym with rolling walker and c/g, occasional verbal cues. In gym pt performed step taps and alternating step taps, 3 sets x 10 reps each. Pt performed slalom course 30 feet x 2 with rolling walker and c/g to min A with multiple verbal cues. Pt ambulated without assistive device and c/g to min A about 140 feet. Pt ambulated 140 feet with st cane and c/g to min A with verbal cues. No buckling noted during treatment session, however noted occasional scissor gait with no assistive device or st cane. Pt ascended/descended 4 stairs with B rails and c/g. Pt ambulated back to room with rolling walker and c/g with verbal cues. Pt transferred edge of bed to supine with S and verbal cues. Pt left sitting up in bed with wife at bedside and bed alarm on.    Therapy Documentation Precautions:  Precautions Precautions: Fall Precaution Comments: R upper quadrant drain Restrictions Weight Bearing Restrictions: No Other Position/Activity Restrictions: pt unable to sit on bottom for extended periods of time due to pain (even on geomat) General:   Pain: Pain Assessment Pain Scale: 0-10 Pain Score: 0-No pain  Therapy/Group: Individual Therapy  MDub Amis11/18/2023, 12:04 PM

## 2021-11-23 DIAGNOSIS — I10 Essential (primary) hypertension: Secondary | ICD-10-CM | POA: Diagnosis not present

## 2021-11-23 DIAGNOSIS — R7989 Other specified abnormal findings of blood chemistry: Secondary | ICD-10-CM | POA: Diagnosis not present

## 2021-11-23 DIAGNOSIS — E1169 Type 2 diabetes mellitus with other specified complication: Secondary | ICD-10-CM | POA: Diagnosis not present

## 2021-11-23 DIAGNOSIS — G0400 Acute disseminated encephalitis and encephalomyelitis, unspecified: Secondary | ICD-10-CM | POA: Diagnosis not present

## 2021-11-23 LAB — GLUCOSE, CAPILLARY
Glucose-Capillary: 119 mg/dL — ABNORMAL HIGH (ref 70–99)
Glucose-Capillary: 121 mg/dL — ABNORMAL HIGH (ref 70–99)
Glucose-Capillary: 141 mg/dL — ABNORMAL HIGH (ref 70–99)
Glucose-Capillary: 159 mg/dL — ABNORMAL HIGH (ref 70–99)
Glucose-Capillary: 150 mg/dL — ABNORMAL HIGH (ref 70–99)

## 2021-11-23 MED ORDER — TAMSULOSIN HCL 0.4 MG PO CAPS
0.8000 mg | ORAL_CAPSULE | Freq: Every day | ORAL | Status: DC
  Administered 2021-11-23 – 2021-11-24 (×2): 0.8 mg via ORAL
  Filled 2021-11-23 (×2): qty 2

## 2021-11-23 NOTE — Progress Notes (Incomplete)
Occupational Therapy Session Note  Patient Details  Name: [name] MRN: [id number] Date of Birth: 1960-09-05  Today's Date: 11/23/2021 OT Individual Time:  -       Short Term Goals: Week 2:  OT Short Term Goal 1 (Week 2): STG=LTG d/t Pt length of stay  Skilled Therapeutic Interventions/Progress Updates:  Session 1: Pt greeted ***, pt agreeable to OT intervention. Session focus on BADL reeducation, functional mobility, dynamic standing balance and decreasing overall caregiver burden.                     Ended session with pt *** in *** with all needs within reach and *** alarm activated.                   Session 2: Pt greeted ***, pt agreeable to OT intervention. Session focus on BADL reeducation, functional mobility, dynamic standing balance and decreasing overall caregiver burden.                     Ended session with pt *** in *** with all needs within reach and *** alarm activated.                    Therapy Documentation Precautions:  Precautions Precautions: Fall Precaution Comments: R upper quadrant drain Restrictions Weight Bearing Restrictions: No Other Position/Activity Restrictions: pt unable to sit on bottom for extended periods of time due to pain (even on geomat)  Pain:    Therapy/Group: Individual Therapy  Pollyann Glen [facility] 11/23/2021, 7:38 AM

## 2021-11-23 NOTE — Progress Notes (Signed)
Occupational Therapy Session Note  Patient Details  Name: Troy Randall MRN: [id number] Date of Birth: [birth year]  Today's Date: 11/23/2021 OT Individual Time: 1594-5859 OT Individual Time Calculation (min): 60 min    Short Term Goals: Week 1:  OT Short Term Goal 1 (Week 1): Pt will complete UB dressing with supervision OT Short Term Goal 1 - Progress (Week 1): Met OT Short Term Goal 2 (Week 1): Pt will complete LB dressing with min A OT Short Term Goal 2 - Progress (Week 1): Met OT Short Term Goal 3 (Week 1): Pt will completed U/LB bathing wiht min A OT Short Term Goal 3 - Progress (Week 1): Met OT Short Term Goal 4 (Week 1): Pt will complete toileting with min A OT Short Term Goal 4 - Progress (Week 1): Partly met (Pt requires min-mod A for posterior peri-care following bowel movements to ensure bottom is fully cleaned) OT Short Term Goal 5 - Progress (Week 1): Met Week 2:  OT Short Term Goal 1 (Week 2): STG=LTG d/t Pt length of stay  Skilled Therapeutic Interventions/Progress Updates:     Pt received resting in bed with family present. Pt presenting increasingly anxious this morning, willing to participate in skilled therapy session.  Pt supine>sitting EOB close supervision. Pt donned socks and shoes EOB with mod A and increased time. Pt able to don R sock/shoe, but requiring HOH to problem solve donning L sock. Pt doff/donned his shirt with set-up assist.  Pt attempted to use restroom prior to therapy session. Pt completed clothing management min A. No continent void or bowel movement this AM. Pt washed his hands standing at sink min verbal cues to initiate next step. Pt brushed his teeth seated at sink with supervision requiring mod direct verbal cues d/t perseverating on tapping toothbrush on sink.   Pt transported total A to therapy gym in wc. Pt requesting to play corn hole during session in preparation for planning with family members upon dc home. Pt completed standing corn  hole game x3 without AD close supervision. Bean bags placed on stool on Pt L side to increase awareness to L visual field with Pt able to retrieve bean bags from low surface with close supervision. Pt provided seated rest breaks between sets.   Pt completed connect 4 game standing at elevated table on compliant surface with RW. Pt maintained balance during activity CGA. Pt instructed to sort red and yellow game pieces into separate piles. Pt able to identify color of pieces when presented one at a time with min A. Pt required max direct verbal cues to place game pieces into correct pile.   Pt was transported back to room in wc total A. Pt returned to bed close supervision and was left resting in bed with call bell in reach, bed alarm on, son in room, and all needs met.   Therapy Documentation Precautions:  Precautions Precautions: Fall Precaution Comments: R upper quadrant drain Restrictions Weight Bearing Restrictions: No Other Position/Activity Restrictions: pt unable to sit on bottom for extended periods of time due to pain (even on geomat) General:   Vital Signs: Therapy Vitals Pulse Rate: 68 BP: 128/77 Patient Position (if appropriate): Lying Pain: Pain Assessment Pain Scale: 0-10 Pain Score: 0-No pain ADL: ADL Eating: Minimal assistance Grooming: Minimal assistance, Maximal cueing Where Assessed-Grooming: Edge of bed Upper Body Bathing: Maximal cueing, Minimal assistance Where Assessed-Upper Body Bathing: Shower Lower Body Bathing: Maximal cueing, Moderate assistance Where Assessed-Lower Body Bathing: Shower Upper Body Dressing:  Maximal cueing, Minimal assistance Where Assessed-Upper Body Dressing: Edge of bed Lower Body Dressing: Maximal cueing, Moderate assistance Where Assessed-Lower Body Dressing: Edge of bed Toileting: Dependent Where Assessed-Toileting: Bed level Toilet Transfer: Minimal assistance Science writer: Immunologist: Maximal cueing, Minimal assistance Social research officer, government Method: Heritage manager: Radio broadcast assistant  Therapy/Group: Individual Therapy  Janey Genta 11/23/2021, 9:59 AM

## 2021-11-23 NOTE — Progress Notes (Signed)
PROGRESS NOTE   Subjective/Complaints:  He continues to require intermittent cath.  No additional concerns or complaints this morning   Review of Systems  Constitutional:  Negative for chills and fever.  HENT:  Negative for congestion.   Eyes:  Negative for double vision.  Respiratory:  Negative for shortness of breath.   Cardiovascular:  Negative for chest pain and palpitations.  Gastrointestinal:  Negative for abdominal pain, constipation, diarrhea, nausea and vomiting.  Genitourinary:  Positive for urgency.  Musculoskeletal:  Positive for back pain.  Neurological:  Negative for weakness and headaches.  Psychiatric/Behavioral:  Positive for memory loss.     Objective:   No results found. No results for input(s): "WBC", "HGB", "HCT", "PLT" in the last 72 hours.  No results for input(s): "NA", "K", "CL", "CO2", "GLUCOSE", "BUN", "CREATININE", "CALCIUM" in the last 72 hours.   Intake/Output Summary (Last 24 hours) at 11/23/2021 1003 Last data filed at 11/23/2021 0631 Gross per 24 hour  Intake 480 ml  Output 2551 ml  Net -2071 ml         Physical Exam: Vital Signs Blood pressure 128/77, pulse 68, temperature 97.9 F (36.6 C), resp. rate 18, height 6' (1.829 m), weight 117.8 kg, SpO2 98 %.   General: No acute distress,lying in bed Mood and affect are appropriate, pleasant Heart: Regular rate and rhythm no rubs murmurs or extra sounds Lungs: Clear to auscultation, breathing unlabored, no rales or wheezes Abdomen: normoactive bowel sounds, soft nontender to palpation, nondistended Chole drain site without erythema or drainage , sutures intact , bag has clear brown fluid, appears to be draining well Extremities: No clubbing, cyanosis, or edema Skin: Warm, Dry No evidence of breakdown, no evidence of rash   GU: foley with yellow urine Neuro: Alert to person, reports my wife can tell you more to most  questions Sensation intact to LT in all 4 extremities Musculoskeletal: Strength 4+/5 throughout  No joint swelling  noted    Assessment/Plan: 1. Functional deficits which require 3+ hours per day of interdisciplinary therapy in a comprehensive inpatient rehab setting. Physiatrist is providing close team supervision and 24 hour management of active medical problems listed below. Physiatrist and rehab team continue to assess barriers to discharge/monitor patient progress toward functional and medical goals  Care Tool:  Bathing    Body parts bathed by patient: Chest, Face, Left arm, Right arm, Front perineal area, Right upper leg, Left upper leg   Body parts bathed by helper: Buttocks, Right lower leg, Left lower leg, Abdomen     Bathing assist Assist Level: Moderate Assistance - Patient 50 - 74%     Upper Body Dressing/Undressing Upper body dressing   What is the patient wearing?: Pull over shirt    Upper body assist Assist Level: Set up assist    Lower Body Dressing/Undressing Lower body dressing      What is the patient wearing?: Pants     Lower body assist Assist for lower body dressing: Minimal Assistance - Patient > 75%     Toileting Toileting    Toileting assist Assist for toileting: Maximal Assistance - Patient 25 - 49%     Transfers Chair/bed transfer  Transfers assist     Chair/bed transfer assist level: Contact Guard/Touching assist Chair/bed transfer assistive device: Armrests, Programmer, multimedia   Ambulation assist      Assist level: Contact Guard/Touching assist Assistive device: Walker-rolling Max distance: 200   Walk 10 feet activity   Assist     Assist level: Contact Guard/Touching assist Assistive device: Walker-rolling   Walk 50 feet activity   Assist    Assist level: Contact Guard/Touching assist Assistive device: Walker-rolling    Walk 150 feet activity   Assist Walk 150 feet activity did not occur:  Safety/medical concerns  Assist level: Contact Guard/Touching assist Assistive device: Walker-rolling    Walk 10 feet on uneven surface  activity   Assist     Assist level: Minimal Assistance - Patient > 75% Assistive device: Chemical engineer     Assist Is the patient using a wheelchair?: Yes Type of Wheelchair: Manual    Wheelchair assist level: Dependent - Patient 0% Max wheelchair distance: >131ft    Wheelchair 50 feet with 2 turns activity    Assist        Assist Level: Dependent - Patient 0%   Wheelchair 150 feet activity     Assist      Assist Level: Dependent - Patient 0%   Blood pressure 128/77, pulse 68, temperature 97.9 F (36.6 C), resp. rate 18, height 6' (1.829 m), weight 117.8 kg, SpO2 98 %.   Medical Problem List and Plan: 1. 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 acute respiratory failure, cholecystitis, and urosepsis.             -patient may shower             -ELOS/Goals: 10-14 days S,              -Continue CIR with PT/OT, order SLP 2.  Antithrombotics: -DVT/anticoagulation:  Pharmaceutical: Lovenox (start 40 mg daily)             -antiplatelet therapy: none 3. Pain Management: Has chronic back and shoulder pain.   -Continue tylenol and robaxin PRN 4. Mood/Behavior/Sleep: LCSW to evaluate and provide emotional support                        -antipsychotic agents: n/a             -continue Atarax 10 mg TID prn anxiety 5. Neuropsych/cognition: This patient is not capable of making decisions on his own behalf. 6. Skin/Wound Care: Routine skin care checks             -dysphagia 3/thin liquids; carb modified  -11/9 Consult for SLP  7. Fluids/Electrolytes/Nutrition: Routine Is and Os and follow-up chemistries 8: SVT: converted to SR, continue metoprolol 25 mg BID 9: Sepsis sue  to GN UTI: completed Augmentin 11/11 10: Cholecystitis s/p cholecystostomy tube placement; no current indication for cholecystectomy S/p IR exchange of chole drain  -routine drain site care and record output daily 11: Elevated LFTs: trending downward; follow-up CMP tomorrow/weekly. Continue to hold statin  -11/19 recheck tomorrow ordered 12: Urinary retention: improving , still with occ I/O cath, cont flomax , add urecholine , rec Uro f/u as OP    -11/19 increase Flomax to 0.8 mg daily 13: DM-2: CBGs q AC              -continue Semglee 12  units BID             -continue SSI  -11/9 CBGs fairly well controlled, continue Semglee 12 BID, continue to monitor  -11/19 CBGs well controlled, continue current regimen  CBG (last 3)  Recent Labs    11/22/21 2106 11/23/21 0550 11/23/21 0648  GLUCAP 150* 119* 121*   Bump Semglee to 15 U BID, improving 11/17, may need hs snack  14: Hypertension: monitor TID and prn              Vitals:   11/23/21 0443 11/23/21 0754  BP: 136/82 128/77  Pulse: 64 68  Resp: 18   Temp: 97.9 F (36.6 C)   SpO2: 98%   BP ok this am, mild systolic elevation yesterday pm will cont to monitor on Lopressor 25mg  BID  11/18 BP well controlled continue metoprolol 25 mg twice daily 15: GIB: 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

## 2021-11-24 DIAGNOSIS — G0400 Acute disseminated encephalitis and encephalomyelitis, unspecified: Secondary | ICD-10-CM | POA: Diagnosis not present

## 2021-11-24 LAB — CBC
HCT: 31.5 % — ABNORMAL LOW (ref 39.0–52.0)
Hemoglobin: 10.2 g/dL — ABNORMAL LOW (ref 13.0–17.0)
MCH: 27.8 pg (ref 26.0–34.0)
MCHC: 32.4 g/dL (ref 30.0–36.0)
MCV: 85.8 fL (ref 80.0–100.0)
Platelets: 183 10*3/uL (ref 150–400)
RBC: 3.67 MIL/uL — ABNORMAL LOW (ref 4.22–5.81)
RDW: 14 % (ref 11.5–15.5)
WBC: 4.4 10*3/uL (ref 4.0–10.5)
nRBC: 0 % (ref 0.0–0.2)

## 2021-11-24 LAB — COMPREHENSIVE METABOLIC PANEL
ALT: 25 U/L (ref 0–44)
AST: 21 U/L (ref 15–41)
Albumin: 2.7 g/dL — ABNORMAL LOW (ref 3.5–5.0)
Alkaline Phosphatase: 62 U/L (ref 38–126)
Anion gap: 12 (ref 5–15)
BUN: 6 mg/dL (ref 6–20)
CO2: 24 mmol/L (ref 22–32)
Calcium: 8.7 mg/dL — ABNORMAL LOW (ref 8.9–10.3)
Chloride: 106 mmol/L (ref 98–111)
Creatinine, Ser: 0.62 mg/dL (ref 0.61–1.24)
GFR, Estimated: >60 mL/min (ref >60)
Glucose, Bld: 100 mg/dL — ABNORMAL HIGH (ref 70–99)
Potassium: 3 mmol/L — ABNORMAL LOW (ref 3.5–5.1)
Sodium: 142 mmol/L (ref 135–145)
Total Bilirubin: 0.5 mg/dL (ref 0.3–1.2)
Total Protein: 5.5 g/dL — ABNORMAL LOW (ref 6.5–8.1)

## 2021-11-24 LAB — GLUCOSE, CAPILLARY
Glucose-Capillary: 97 mg/dL (ref 70–99)
Glucose-Capillary: 143 mg/dL — ABNORMAL HIGH (ref 70–99)
Glucose-Capillary: 183 mg/dL — ABNORMAL HIGH (ref 70–99)
Glucose-Capillary: 147 mg/dL — ABNORMAL HIGH (ref 70–99)

## 2021-11-24 MED ORDER — POTASSIUM CHLORIDE CRYS ER 20 MEQ PO TBCR
40.0000 meq | EXTENDED_RELEASE_TABLET | Freq: Every day | ORAL | Status: AC
  Administered 2021-11-24 – 2021-11-25 (×2): 40 meq via ORAL
  Filled 2021-11-24 (×2): qty 2

## 2021-11-24 MED ORDER — LIDOCAINE HCL (CARDIAC) PF 100 MG/5ML IV SOSY
PREFILLED_SYRINGE | INTRAVENOUS | Status: AC
  Administered 2021-11-24: 100 mg
  Filled 2021-11-24: qty 5

## 2021-11-24 NOTE — Progress Notes (Signed)
Patient ID: Troy Randall, male   DOB: September 07, 1960, 61 y.o.   MRN: [id number]  Standard BSC ordered through [employer]

## 2021-11-24 NOTE — Progress Notes (Signed)
Occupational Therapy Session Note  Patient Details  Name: Troy Randall MRN: [id number] Date of Birth: August 19, 1960  Today's Date: 11/24/2021 OT Individual Time: 1035-1130 OT Individual Time Calculation (min): 55 min  OT Missed Time: 20 min (d/t nursing care)  OT Individual Time: 1430-1500 OT Individual Time Calculation (min): 30 min   Short Term Goals: Week 1:  OT Short Term Goal 1 (Week 1): Pt will complete UB dressing with supervision OT Short Term Goal 1 - Progress (Week 1): Met OT Short Term Goal 2 (Week 1): Pt will complete LB dressing with min A OT Short Term Goal 2 - Progress (Week 1): Met OT Short Term Goal 3 (Week 1): Pt will completed U/LB bathing wiht min A OT Short Term Goal 3 - Progress (Week 1): Met OT Short Term Goal 4 (Week 1): Pt will complete toileting with min A OT Short Term Goal 4 - Progress (Week 1): Partly met (Pt requires min-mod A for posterior peri-care following bowel movements to ensure bottom is fully cleaned) OT Short Term Goal 5 - Progress (Week 1): Met Week 2:  OT Short Term Goal 1 (Week 2): STG=LTG d/t Pt length of stay  Skilled Therapeutic Interventions/Progress Updates:     AM Session: Pt received resting in bed with nursing staff present providing catheter care and medications. Pt anxious upon OT arival following cath care. Pt receptive to skilled OT session with wife present for family education.  Pt and wife educated on cues for supporting Pt independence during bathing and dressing tasks. Wife also educated on completing transfers and bathroom safety following shower demonstrating good teach back during session.  Pt completed shower seated on tub bench close supervision for U/LB bathing. Pt benefits from increased time and mod cues to initiate bathing next body part.  Pt ambulated within room and completed sit>stands using RW close supervision. Pt donned shirt seated EOB supervision and pants min A to weave feet.  Wife educated on wc management and  components of wc demonstrating good teach back. Pt wife able to lock/unlock breaks and remove/put on wc foot rests with independence following education.  Pt transported to ADL apartment to practice simple IADL tasks and for family education. Pt put away dishes (cups, pates, and bowls) and food items with mod multimodal cues. Pt benefits from cabinet labels as visual cues during task. Wife educated on simple IADLs for Pt to assist with following dc home to increase Pt moral/self-efficacy and potentially decrease caregiver bourdon. Wife educated on using errorless learning strategies and repetition to support Pt learning. Pt was transported back to room total A in wc and handed off to SLP.   PM Session: Pt received sitting up in bed with wife present in good spirits and motivated to participate in skilled OT session. Wife present throughout session to obtain hands-on experience with wc management and assisting Pt in transfers. Pt transported to therapy gym total A in wc. Pt and wife participated in corn hole game in preparation for playing during family events following d/c. Pt maintained dynamic standing balance during task without AD CGA.  Task graded up to incorporate cognitive components. Pt instructed to throw bean bags onto matching color disks placed on floor. Pt able to accurately match bean bags with 100 accuracy when presented with two options. Task graded up to include 4 color options. Pt able to match correctly on initial attempt 50% of the time with errorless learning strategies incorporated into task. Pt transported back to room total A  in wc and returned to bed supervision mod verbal cues. Pt was left resting in bed with bed alarm on, call bell in reach, and all need met.   Therapy Documentation Precautions:  Precautions Precautions: Fall Precaution Comments: R upper quadrant drain Restrictions Weight Bearing Restrictions: No Other Position/Activity Restrictions: pt unable to sit on bottom  for extended periods of time due to pain (even on geomat) General:     ADL: ADL Eating: Minimal assistance Grooming: Minimal assistance, Maximal cueing Where Assessed-Grooming: Edge of bed Upper Body Bathing: Maximal cueing, Minimal assistance Where Assessed-Upper Body Bathing: Shower Lower Body Bathing: Maximal cueing, Moderate assistance Where Assessed-Lower Body Bathing: Shower Upper Body Dressing: Maximal cueing, Minimal assistance Where Assessed-Upper Body Dressing: Edge of bed Lower Body Dressing: Maximal cueing, Moderate assistance Where Assessed-Lower Body Dressing: Edge of bed Toileting: Dependent Where Assessed-Toileting: Bed level Toilet Transfer: Minimal assistance Science writer: Geophysical data processor: Maximal cueing, Minimal assistance Social research officer, government Method: Heritage manager: Radio broadcast assistant    Therapy/Group: Individual Therapy  Janey Genta 11/24/2021, 7:55 AM

## 2021-11-24 NOTE — Progress Notes (Signed)
PROGRESS NOTE   Subjective/Complaints:  He continues to require intermittent cath.  No additional concerns or complaints this morning Discussed painful caths, flomax increased yesterday, will cont voiding trial but will likely go home with foley and Urology f/u   Review of Systems  Constitutional:  Negative for chills and fever.  HENT:  Negative for congestion.   Eyes:  Negative for double vision.  Respiratory:  Negative for shortness of breath.   Cardiovascular:  Negative for chest pain and palpitations.  Gastrointestinal:  Negative for abdominal pain, constipation, diarrhea, nausea and vomiting.  Genitourinary:  Positive for urgency.  Musculoskeletal:  Positive for back pain.  Neurological:  Negative for weakness and headaches.  Psychiatric/Behavioral:  Positive for memory loss.     Objective:   No results found. Recent Labs    11/24/21 0509  WBC 4.4  HGB 10.2*  HCT 31.5*  PLT 183    Recent Labs    11/24/21 0509  NA 142  K 3.0*  CL 106  CO2 24  GLUCOSE 100*  BUN 6  CREATININE 0.62  CALCIUM 8.7*     Intake/Output Summary (Last 24 hours) at 11/24/2021 0759 Last data filed at 11/24/2021 0300 Gross per 24 hour  Intake 367 ml  Output 761 ml  Net -394 ml         Physical Exam: Vital Signs Blood pressure 138/86, pulse 64, temperature 97.8 F (36.6 C), resp. rate 18, height 6' (1.829 m), weight 117.8 kg, SpO2 99 %.   General: No acute distress,lying in bed Mood and affect are appropriate, pleasant Heart: Regular rate and rhythm no rubs murmurs or extra sounds Lungs: Clear to auscultation, breathing unlabored, no rales or wheezes Abdomen: normoactive bowel sounds, soft nontender to palpation, nondistended Chole drain site without erythema or drainage , sutures intact , bag has clear brown fluid, appears to be draining well Extremities: No clubbing, cyanosis, or edema Skin: Warm, Dry No evidence of  breakdown, no evidence of rash   GU: foley with yellow urine Neuro: Alert to person, reports my wife can tell you more to most questions Sensation intact to LT in all 4 extremities Musculoskeletal: Strength 4+/5 throughout  No joint swelling  noted    Assessment/Plan: 1. Functional deficits which require 3+ hours per day of interdisciplinary therapy in a comprehensive inpatient rehab setting. Physiatrist is providing close team supervision and 24 hour management of active medical problems listed below. Physiatrist and rehab team continue to assess barriers to discharge/monitor patient progress toward functional and medical goals  Care Tool:  Bathing    Body parts bathed by patient: Chest, Face, Left arm, Right arm, Front perineal area, Right upper leg, Left upper leg   Body parts bathed by helper: Buttocks, Right lower leg, Left lower leg, Abdomen     Bathing assist Assist Level: Moderate Assistance - Patient 50 - 74%     Upper Body Dressing/Undressing Upper body dressing   What is the patient wearing?: Pull over shirt    Upper body assist Assist Level: Set up assist    Lower Body Dressing/Undressing Lower body dressing      What is the patient wearing?: Pants  Lower body assist Assist for lower body dressing: Minimal Assistance - Patient > 75%     Toileting Toileting    Toileting assist Assist for toileting: Maximal Assistance - Patient 25 - 49%     Transfers Chair/bed transfer  Transfers assist     Chair/bed transfer assist level: Contact Guard/Touching assist Chair/bed transfer assistive device: Armrests, Programmer, multimedia   Ambulation assist      Assist level: Contact Guard/Touching assist Assistive device: Walker-rolling Max distance: 200   Walk 10 feet activity   Assist     Assist level: Contact Guard/Touching assist Assistive device: Walker-rolling   Walk 50 feet activity   Assist    Assist level: Contact  Guard/Touching assist Assistive device: Walker-rolling    Walk 150 feet activity   Assist Walk 150 feet activity did not occur: Safety/medical concerns  Assist level: Contact Guard/Touching assist Assistive device: Walker-rolling    Walk 10 feet on uneven surface  activity   Assist     Assist level: Minimal Assistance - Patient > 75% Assistive device: Chemical engineer     Assist Is the patient using a wheelchair?: Yes Type of Wheelchair: Manual    Wheelchair assist level: Dependent - Patient 0% Max wheelchair distance: >118ft    Wheelchair 50 feet with 2 turns activity    Assist        Assist Level: Dependent - Patient 0%   Wheelchair 150 feet activity     Assist      Assist Level: Dependent - Patient 0%   Blood pressure 138/86, pulse 64, temperature 97.8 F (36.6 C), resp. rate 18, height 6' (1.829 m), weight 117.8 kg, SpO2 99 %.   Medical Problem List and Plan: 1. 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 acute respiratory failure, cholecystitis, and urosepsis.             -patient may shower             -ELOS/Goals: 11/26/21             -Continue CIR with PT/OT,  SLP 2.  Antithrombotics: -DVT/anticoagulation:  Pharmaceutical: Lovenox (start 40 mg daily)             -antiplatelet therapy: none 3. Pain Management: Has chronic back and shoulder pain.   -Continue tylenol and robaxin PRN 4. Mood/Behavior/Sleep: LCSW to evaluate and provide emotional support                        -antipsychotic agents: n/a             -continue Atarax 10 mg TID prn anxiety 5. Neuropsych/cognition: This patient is not capable of making decisions on his own behalf. 6. Skin/Wound Care: Routine skin care checks             -dysphagia 3/thin liquids; carb modified  -11/9 Consult for SLP  7.  Fluids/Electrolytes/Nutrition: Routine Is and Os and follow-up chemistries 8: SVT: converted to SR, continue metoprolol 25 mg BID 9: Sepsis sue to GN UTI: completed Augmentin 11/11 10: Cholecystitis s/p cholecystostomy tube placement; no current indication for cholecystectomy S/p IR exchange of chole drain  -routine drain site care and record output daily 11: Elevated LFTs: trending downward; follow-up CMP tomorrow/weekly. Continue to hold statin  -11/19 recheck tomorrow ordered 12: Urinary retention: improving , still with occ I/O cath, cont flomax ,  add urecholine , rec Uro f/u as OP    -11/19 increase Flomax to 0.8 mg daily Some voids but still req I/O cath several times a day, pt states it is painful.  Will place foley tomorrow if no improvement on higher dose flomax  13: DM-2: CBGs q AC              -continue Semglee 12 units BID             -continue SSI  -11/9 CBGs fairly well controlled, continue Semglee 12 BID, continue to monitor  -11/19 CBGs well controlled, continue current regimen  CBG (last 3)  Recent Labs    11/23/21 1618 11/23/21 2102 11/24/21 0534  GLUCAP 159* 150* 97   Bump Semglee to 15 U BID,controlled   14: Hypertension: monitor TID and prn              Vitals:   11/23/21 1947 11/24/21 0318  BP: (!) 142/82 138/86  Pulse: 67 64  Resp: 18 18  Temp: 98.4 F (36.9 C) 97.8 F (36.6 C)  SpO2: 99% 99%  BP ok this am, mild systolic elevation yesterday pm will cont to monitor on Lopressor 25mg  BID  11/18 BP well controlled continue metoprolol 25 mg twice daily 15: GIB: 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  Epigastric pain in am s is likely related will change pepcid to protonix  16: Chronic colitis; follow-up with GI as outpatient 17: Probable MS s/p high dose steroids and plasma exchange             -follow-up with neurology as outpatient 18: Hyperlipidemia: statin on hold due to elevated LFTs 19: Anemia: follow-up CBC      Latest Ref  Rng & Units 11/24/2021    5:09 AM 11/17/2021    5:01 AM 11/13/2021    7:03 AM  CBC  WBC 4.0 - 10.5 K/uL 4.4  5.4  7.1   Hemoglobin 13.0 - 17.0 g/dL 10.2  10.2  11.1   Hematocrit 39.0 - 52.0 % 31.5  33.3  35.3   Platelets 150 - 400 K/uL 183  198  210    Hgb stable , mild anemia  20: Obesity, class 2: BMI = 34.7; dietary counseling    LOS: 12 days A FACE TO FACE EVALUATION WAS PERFORMED  Charlett Blake 11/24/2021, 7:59 AM

## 2021-11-24 NOTE — Progress Notes (Addendum)
Patient ID: Troy Randall, male   DOB: [birth year], 61 y.o.   MRN: [id number]  BARI BSC and SHOWER CHAIR ORDERED THROUGH [facility]  [facility] referral sent to [facility] Orders sent to [facility].

## 2021-11-24 NOTE — Progress Notes (Signed)
Speech Language Pathology Daily Session Note  Patient Details  Name: Troy Randall MRN: [id number] Date of Birth: 09-Jul-1960  Today's Date: 11/24/2021 SLP Individual Time: 1130-1200 SLP Individual Time Calculation (min): 30 min  Short Term Goals: Week 2: SLP Short Term Goal 1 (Week 2): STG=LTG due to ELOS  Skilled Therapeutic Interventions: Pt seen for skilled ST with focus on cognitive goals, pt in bed after OT session and agreeable to therapeutic tasks. Wife initially present but left room to assist in patient's attention and focus on interventions. SLP facilitating orientation by providing mod-max A verbal and visual cues to increase understanding, recall and carryover. Pt initially independently oriented to upcoming holiday "Thanksgiving" however unable to recall later and unable to recall current MOY despite cue for "Thanksgiving". Pt with limited awareness of discharge date Wednesday 11/22 and required max A cues to recall during session. SLP facilitating sustained attention with visual stimuli with patient benefiting from min verbal cues for 4-5 minute intervals. Pt appeared apathetic at time, responding with "I don't know" and "not really" to most questions, perseverating on going home. Pt left in bed with all needs met and bed alarm set, cont ST POC.   Pain Pain Assessment Pain Scale: 0-10 Pain Score: 0-No pain  Therapy/Group: Individual Therapy  Dewaine Conger 11/24/2021, 11:43 AM

## 2021-11-24 NOTE — Progress Notes (Signed)
Physical Therapy Session Note  Patient Details  Name: Troy Randall MRN: [id number] Date of Birth: May 07, 1960  Today's Date: 11/24/2021 PT Individual Time: 0805-0902 PT Individual Time Calculation (min): 57 min   Short Term Goals: Week 1:  PT Short Term Goal 1 (Week 1): Pt will perform supine<>sit with CGA consistently PT Short Term Goal 1 - Progress (Week 1): Met (using hospital bed features per home set-up) PT Short Term Goal 2 (Week 1): Pt will perform sit<>stands using LRAD with CGA consistently PT Short Term Goal 2 - Progress (Week 1): Met PT Short Term Goal 3 (Week 1): Pt will perform bed<>chair transfers using LRAD with CGA PT Short Term Goal 3 - Progress (Week 1): Met PT Short Term Goal 4 (Week 1): Pt will ambulate at least 66f using LRAD with CGA PT Short Term Goal 4 - Progress (Week 1): Met PT Short Term Goal 5 (Week 1): Pt will navigate 2 steps using LRAD with CGA PT Short Term Goal 5 - Progress (Week 1): Met Week 2:  PT Short Term Goal 1 (Week 2): = to LTGs based on ELOS   Skilled Therapeutic Interventions/Progress Updates:  Patient seated upright in w/c  on entrance to room. Pt seeming agitated with focus on wife. NT present and cleaning up room. Relates that JP drain leaked and required cleanup of bed and pt's clothing. Patient alert and agreeable to PT session.   Patient with no pain complaint at start of session.  Pt stands in room to leave, first stance, he kicks shoes off and must be redirected back to sit in w/c. On second attempt to walk out of room, he tries to enter bathroom and must be redirected to return to seat in w/c. On third stance he goes to door and attempts to turn around to wife x3 prior to sitting in w/c behind pt. Finally able to guide pt into hallway. Bumps walker into objects on R side. But is able to self correct.   Wife is good throughout and actions/ reactions are calm to attempt to calm pt.   Therapeutic Activity: Transfers: Pt performed  sit<>stand and stand pivot transfers throughout session with supervision. Reduced impulsive actions, but increase in questioning "what's next" over previous session.  Provided verbal cues for completing turn with use of RW.  Gait Training:  Ambulates >200 ft to main therapy gym and setup with circuit for NMR and cognition. Cues to pt for visual scanning in order to navigate obstacles on R side of hallway.  Neuromuscular Re-ed: NMR facilitated during session with focus on standing balance, following instructions, problem solving, and activity tolerance. Pt guided in circuit. First station with ambulation back/ forth with colored bean bags to match to colored discs on raised table surface. Pt is able to recite 4/5 colors correctly. Is unable to follow instructions for matching bag to color disc.   Second station with clothes pins to raised basketball net. Then taking them off  Third station with red lines on wall and pt required to take 1kg ball and touch above higher line and below lower line x5 with each approach. Pt's breathing rate increased with performance and requires seated rest break. NMR performed for improvements in motor control and coordination, balance, sequencing, judgement, and self confidence/ efficacy in performing all aspects of mobility at highest level of independence.   After return ambulation bout to room, pt is able to relate 2/ 3 tasks performed to wife when asked for recall of therapy events.  Patient seated upright in w/c at end of session with brakes locked, no alarm set as wife with pt in room, and all needs within reach.    Therapy Documentation Precautions:  Precautions Precautions: Fall Precaution Comments: R upper quadrant drain Restrictions Weight Bearing Restrictions: No Other Position/Activity Restrictions: pt unable to sit on bottom for extended periods of time due to pain (even on geomat) General:   Vital Signs:   Pain:  Pt with no pain complaint  this session but does complain of fatigue.   Therapy/Group: Individual Therapy  Alger Simons PT, DPT, CSRS 11/24/2021, 9:03 AM

## 2021-11-25 DIAGNOSIS — G0400 Acute disseminated encephalitis and encephalomyelitis, unspecified: Secondary | ICD-10-CM | POA: Diagnosis not present

## 2021-11-25 LAB — GLUCOSE, CAPILLARY
Glucose-Capillary: 102 mg/dL — ABNORMAL HIGH (ref 70–99)
Glucose-Capillary: 99 mg/dL (ref 70–99)
Glucose-Capillary: 97 mg/dL (ref 70–99)
Glucose-Capillary: 89 mg/dL (ref 70–99)

## 2021-11-25 MED ORDER — TAMSULOSIN HCL 0.4 MG PO CAPS
0.4000 mg | ORAL_CAPSULE | Freq: Every day | ORAL | Status: DC
  Administered 2021-11-25: 0.4 mg via ORAL
  Filled 2021-11-25: qty 1

## 2021-11-25 NOTE — Progress Notes (Signed)
PROGRESS NOTE   Subjective/Complaints:  No issues overnite , still not voiding , we discussed need to keep up with HEP post discharge   Review of Systems  Constitutional:  Negative for chills and fever.  HENT:  Negative for congestion.   Eyes:  Negative for double vision.  Respiratory:  Negative for shortness of breath.   Cardiovascular:  Negative for chest pain and palpitations.  Gastrointestinal:  Negative for abdominal pain, constipation, diarrhea, nausea and vomiting.  Genitourinary:  Positive for urgency.  Musculoskeletal:  Positive for back pain.  Neurological:  Negative for weakness and headaches.  Psychiatric/Behavioral:  Positive for memory loss.     Objective:   No results found. Recent Labs    11/24/21 0509  WBC 4.4  HGB 10.2*  HCT 31.5*  PLT 183    Recent Labs    11/24/21 0509  NA 142  K 3.0*  CL 106  CO2 24  GLUCOSE 100*  BUN 6  CREATININE 0.62  CALCIUM 8.7*     Intake/Output Summary (Last 24 hours) at 11/25/2021 0740 Last data filed at 11/25/2021 0519 Gross per 24 hour  Intake 418 ml  Output 2025 ml  Net -1607 ml         Physical Exam: Vital Signs Blood pressure (!) 146/79, pulse 64, temperature 97.8 F (36.6 C), resp. rate 18, height 6' (1.829 m), weight 117.8 kg, SpO2 98 %.   General: No acute distress,lying in bed Mood and affect are appropriate, pleasant Heart: Regular rate and rhythm no rubs murmurs or extra sounds Lungs: Clear to auscultation, breathing unlabored, no rales or wheezes Abdomen: normoactive bowel sounds, soft nontender to palpation, nondistended Chole drain site without erythema or drainage , sutures intact , bag has clear brown fluid, full bag Extremities: No clubbing, cyanosis, or edema Skin: Warm, Dry No evidence of breakdown, no evidence of rash   GU: foley with yellow urine Neuro: Alert to person, place, aware of d/c in am  Sensation intact to LT  in all 4 extremities Musculoskeletal: Strength 4+/5 throughout  No joint swelling  noted    Assessment/Plan: 1. Functional deficits which require 3+ hours per day of interdisciplinary therapy in a comprehensive inpatient rehab setting. Physiatrist is providing close team supervision and 24 hour management of active medical problems listed below. Physiatrist and rehab team continue to assess barriers to discharge/monitor patient progress toward functional and medical goals  Care Tool:  Bathing    Body parts bathed by patient: Chest, Face, Left arm, Right arm, Front perineal area, Right upper leg, Left upper leg   Body parts bathed by helper: Buttocks, Right lower leg, Left lower leg, Abdomen     Bathing assist Assist Level: Moderate Assistance - Patient 50 - 74%     Upper Body Dressing/Undressing Upper body dressing   What is the patient wearing?: Pull over shirt    Upper body assist Assist Level: Set up assist    Lower Body Dressing/Undressing Lower body dressing      What is the patient wearing?: Pants     Lower body assist Assist for lower body dressing: Minimal Assistance - Patient > 75%     Toileting  Toileting    Toileting assist Assist for toileting: Maximal Assistance - Patient 25 - 49%     Transfers Chair/bed transfer  Transfers assist     Chair/bed transfer assist level: Contact Guard/Touching assist Chair/bed transfer assistive device: Armrests, Programmer, multimedia   Ambulation assist      Assist level: Contact Guard/Touching assist Assistive device: Walker-rolling Max distance: 200   Walk 10 feet activity   Assist     Assist level: Contact Guard/Touching assist Assistive device: Walker-rolling   Walk 50 feet activity   Assist    Assist level: Contact Guard/Touching assist Assistive device: Walker-rolling    Walk 150 feet activity   Assist Walk 150 feet activity did not occur: Safety/medical concerns  Assist  level: Contact Guard/Touching assist Assistive device: Walker-rolling    Walk 10 feet on uneven surface  activity   Assist     Assist level: Minimal Assistance - Patient > 75% Assistive device: Chemical engineer     Assist Is the patient using a wheelchair?: Yes Type of Wheelchair: Manual    Wheelchair assist level: Dependent - Patient 0% Max wheelchair distance: >182ft    Wheelchair 50 feet with 2 turns activity    Assist        Assist Level: Dependent - Patient 0%   Wheelchair 150 feet activity     Assist      Assist Level: Dependent - Patient 0%   Blood pressure (!) 146/79, pulse 64, temperature 97.8 F (36.6 C), resp. rate 18, height 6' (1.829 m), weight 117.8 kg, SpO2 98 %.   Medical Problem List and Plan: 1. 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 acute respiratory failure, cholecystitis, and urosepsis.             -patient may shower             -ELOS/Goals: 11/26/21 Will need home with foley- replace today F/u iwht Neuro (Dr Felecia Shelling) , Andreas Newport, PCP, Gen Surg and PMR             -Continue CIR with PT/OT,  SLP 2.  Antithrombotics: -DVT/anticoagulation:  Pharmaceutical: Lovenox (start 40 mg daily)             -antiplatelet therapy: none 3. Pain Management: Has chronic back and shoulder pain.   -Continue tylenol and robaxin PRN 4. Mood/Behavior/Sleep: LCSW to evaluate and provide emotional support                        -antipsychotic agents: n/a             -continue Atarax 10 mg TID prn anxiety 5. Neuropsych/cognition: This patient is not capable of making decisions on his own behalf. 6. Skin/Wound Care: Routine skin care checks             -dysphagia 3/thin liquids; carb modified  -11/9 Consult for SLP  7. Fluids/Electrolytes/Nutrition: Routine Is and Os and follow-up chemistries 8: SVT:  converted to SR, continue metoprolol 25 mg BID 9: Sepsis sue to GN UTI: completed Augmentin 11/11 10: Cholecystitis s/p cholecystostomy tube placement; no current indication for cholecystectomy S/p IR exchange of chole drain  -routine drain site care and record output daily 11: Elevated LFTs: trending downward; follow-up CMP tomorrow/weekly. Continue to hold statin  -11/19 recheck tomorrow ordered 12: Urinary retention: improving , still with occ I/O cath, cont  flomax , add urecholine , rec Uro f/u as OP    -11/19 increase Flomax to 0.8 mg daily Some voids but still req I/O cath several times a day, pt states it is painful.  Will place foley tomorrow if no improvement on higher dose flomax  13: DM-2: CBGs q AC              -continue Semglee 12 units BID             -continue SSI  -11/9 CBGs fairly well controlled, continue Semglee 12 BID, continue to monitor  -11/19 CBGs well controlled, continue current regimen  CBG (last 3)  Recent Labs    11/24/21 1645 11/24/21 2040 11/25/21 0546  GLUCAP 183* 147* 102*   Bump Semglee to 15 U BID,controlled   14: Hypertension: monitor TID and prn              Vitals:   11/24/21 1950 11/25/21 0507  BP: (!) 166/84 (!) 146/79  Pulse: 70 64  Resp: 18 18  Temp: 97.8 F (36.6 C) 97.8 F (36.6 C)  SpO2: 95% 98%  Some pm elevation  11/21 BP well controlled continue metoprolol 25 mg twice daily- PCP f/u as OP  15: GIB: 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  Epigastric pain in am s is likely related will change pepcid to protonix  16: Chronic colitis; follow-up with GI as outpatient 17: Probable MS s/p high dose steroids and plasma exchange             -follow-up with neurology as outpatient 18: Hyperlipidemia: statin on hold due to elevated LFTs 19: Anemia: follow-up CBC      Latest Ref Rng & Units 11/24/2021    5:09 AM 11/17/2021    5:01 AM 11/13/2021    7:03 AM  CBC  WBC 4.0 - 10.5 K/uL 4.4  5.4  7.1   Hemoglobin 13.0  - 17.0 g/dL 26.3  33.5  45.6   Hematocrit 39.0 - 52.0 % 31.5  33.3  35.3   Platelets 150 - 400 K/uL 183  198  210    Hgb stable , mild anemia  20: Obesity, class 2: BMI = 34.7; dietary counseling    LOS: 13 days A FACE TO FACE EVALUATION WAS PERFORMED  Erick Colace 11/25/2021, 7:40 AM

## 2021-11-25 NOTE — Progress Notes (Signed)
Inpatient Rehabilitation Care Coordinator Discharge Note   Patient Details  Name: Troy Randall MRN: [id number] Date of Birth: 06-07-1960   Discharge location: Home  Length of Stay: 14 Days  Discharge activity level: Cga/Min A  Home/community participation: Spouse  Patient response MH:[id number] Literacy - How often do you need to have someone help you when you read instructions, pamphlets, or other written material from your doctor or pharmacy?: Often  Patient response NL:[id number] Isolation - How often do you feel lonely or isolated from those around you?: Never  Services provided included: SW, Neuropsych, Pharmacy, TR, RN, CM, SLP, OT, PT, RD, MD  Financial Services:  Financial Services Utilized: Private Insurance [employer]  Choices offered to/list presented to: Patient and spouse  Follow-up services arranged:  Home Health Home Health Agency: [place] [facility] OT SLP         Patient response to transportation need: Is the patient able to respond to transportation needs?: Yes In the past 12 months, has lack of transportation kept you from medical appointments or from getting medications?: No In the past 12 months, has lack of transportation kept you from meetings, work, or from getting things needed for daily living?: No    Comments (or additional information):  Patient/Family verbalized understanding of follow-up arrangements:  Yes  Individual responsible for coordination of the follow-up plan: [name] [phone]  Confirmed correct DME delivered: [name] 11/25/2021    [name]

## 2021-11-25 NOTE — Progress Notes (Signed)
Occupational Therapy Discharge Summary  Patient Details  Name: Troy Randall MRN: [id number] Date of Birth: December 28, 1960  Date of Discharge from OT service:November 25, 2021  Today's Date: 11/25/2021 OT Individual Time: 0808-0900 OT Individual Time Calculation (min): 57 min  OT Individual Time: 1300-1330 OT Individual Time Calculation (min): 30 min   Patient has met 11 of 12 long term goals due to improved activity tolerance, improved balance, postural control, ability to compensate for deficits, improved attention, improved awareness, and improved coordination.  Patient to discharge at overall Supervision level.  Patient's care partner is independent to provide the necessary physical and cognitive assistance at discharge.    Reasons goals not met: Pt currently requires mod verbal cues to realize when a mistake has occurred during functional activities and ADLs. Pt benefits from repetition and direct cues to provide insight to when he has made a mistake.   Recommendation:  Patient will benefit from ongoing skilled OT services in home health setting to continue to advance functional skills in the area of BADL, iADL, Vocation, and Reduce care partner burden.  Equipment: BSC, Shower Chair   Reasons for discharge: treatment goals met and discharge from hospital  Patient/family agrees with progress made and goals achieved: Yes  AM Session: Pt received resting in bed with wife present in room motivated and reporting 0/10 pain. Wife receptive of assisting/observing Pt during BADL focused session to received education on Pt functional status and strategies to increase Pt independence.  Pt supine>sitting EOB>standing with RW supervision. Pt ambulated to bathroom and transferred to standard toilet seat supervision. Pt attempted to have a bowel movement, but unsuccessful during session. Pt ambulated to tub transfer bench using RW supervision. Wife educated on multimodal cues to use when assisting Pt  in bathing and Pt completed U/LB bathing at overall supervision. Pt ambulated back to room using RW and completed U/LB dressing with supervision. Pt returned to bed and was left resting in bed with wife present in room, bed alarm on, call bell in reach, and all needs met.   PM Session: Pt received resting in bed agitated d/t wife leaving for short period of time. Emotional support and therapeutic listening provided. Pt receptive to participating in skilled OT session reporting O/10 pain during session. Pt catheter bag emptied prior to session and documented in flow sheets.  Pt supine>EOB supervision. Pt ambulated to therapy gym using RW supervision. Pt instructed to complete bean bag color matching task in standing without AD. Pt able to correctly identify colors of 5/5 bean bags and colored disks prior to activity. During task, Pt able to correctly match 3/5 bean bags with supervision requiring mod multimodal cues to match 2/5. Pt ambulated back to room using RW supervision and was left resting in bed with bed alarm on, call bell in reach, and all needs met.    OT Discharge Precautions/Restrictions  Precautions Precautions: Fall;Other (comment) Precaution Comments: R upper quadrant drain Restrictions Weight Bearing Restrictions: No Other Position/Activity Restrictions: pt unable to sit on bottom for extended periods of time due to pain (even on geomat) General   Vital Signs Therapy Vitals Temp: 97.6 F (36.4 C) Pulse Rate: 70 Resp: 18 BP: (Abnormal) 151/86 Patient Position (if appropriate): Lying Oxygen Therapy SpO2: 98 % O2 Device: Room Air Pain   ADL ADL Eating: Independent Grooming: Supervision/safety Where Assessed-Grooming: Sitting at sink Upper Body Bathing: Supervision/safety Where Assessed-Upper Body Bathing: Shower Lower Body Bathing: Supervision/safety Where Assessed-Lower Body Bathing: Shower Upper Body Dressing: Supervision/safety Where Assessed-Upper  Body Dressing:  Edge of bed Lower Body Dressing: Supervision/safety Where Assessed-Lower Body Dressing: Edge of bed Toileting: Supervision/safety, Minimal assistance Where Assessed-Toileting: Glass blower/designer: Close supervision Toilet Transfer Method: Counselling psychologist: Engineer, technical sales Transfer: Close supervison Clinical cytogeneticist Method: Optometrist: Civil engineer, contracting with back Social research officer, government: Close supervision Social research officer, government Method: Heritage manager: Gaffer Baseline Vision/History: 1 Wears glasses Patient Visual Report: No change from baseline Vision Assessment?: Yes Eye Alignment: Within Functional Limits Ocular Range of Motion: Within Functional Limits Alignment/Gaze Preference: Within Defined Limits Tracking/Visual Pursuits: Able to track stimulus in all quads without difficulty Saccades: Within functional limits Convergence: Within functional limits Visual Fields: No apparent deficits Perception  Perception: Impaired Inattention/Neglect: Does not attend to left side of body;Does not attend to left visual field Praxis Praxis: Impaired Praxis Impairment Details: Motor planning;Perseveration;Ideomotor Cognition Cognition Overall Cognitive Status: Impaired/Different from baseline Arousal/Alertness: Awake/alert Orientation Level: Person;Place Memory: Impaired Memory Impairment: Storage deficit;Decreased recall of new information;Decreased short term memory Decreased Short Term Memory: Verbal basic Attention: Focused;Sustained;Selective Focused Attention: Appears intact Focused Attention Impairment: Verbal basic Sustained Attention: Appears intact Sustained Attention Impairment: Verbal basic Selective Attention: Impaired Selective Attention Impairment: Verbal basic Awareness: Impaired Awareness Impairment: Intellectual impairment Problem Solving: Impaired Problem Solving  Impairment: Verbal basic;Functional basic Behaviors: Restless;Perseveration Safety/Judgment: Impaired Brief Interview for Mental Status (BIMS) Repetition of Three Words (First Attempt): 3 Temporal Orientation: Year: Correct Temporal Orientation: Month: Accurate within 5 days Temporal Orientation: Day: Incorrect Recall: "Sock": No, could not recall Recall: "Blue": Yes, after cueing ("a color") Recall: "Bed": Yes, no cue required BIMS Summary Score: 11 Sensation Sensation Light Touch: Appears Intact Hot/Cold: Not tested Proprioception: Impaired by gross assessment Stereognosis: Not tested Coordination Gross Motor Movements are Fluid and Coordinated: No Fine Motor Movements are Fluid and Coordinated: No Coordination and Movement Description: GM significantly improved since eval; Pt continues to have slowed UE motor movements during functional tasks Finger Nose Finger Test: unable to follow directions for test Motor  Motor Motor: Other (comment);Abnormal postural alignment and control;Motor perseverations Motor - Discharge Observations: generalized weakness and deconditioning with impaired coordination that has improved significantly since initial evaluation Mobility  Bed Mobility Bed Mobility: Supine to Sit;Sit to Supine Rolling Right: Supervision/verbal cueing Rolling Left: Supervision/Verbal cueing Supine to Sit: Supervision/Verbal cueing Sit to Supine: Supervision/Verbal cueing Transfers Sit to Stand: Supervision/Verbal cueing Stand to Sit: Supervision/Verbal cueing  Trunk/Postural Assessment  Cervical Assessment Cervical Assessment: Within Functional Limits Thoracic Assessment Thoracic Assessment: Exceptions to [facility] (thoracic rounding) Lumbar Assessment Lumbar Assessment: Exceptions to [facility] Postural Control Postural Control: Within Functional Limits  Balance Balance Balance Assessed: Yes Static Sitting Balance Static Sitting - Balance Support: Feet supported Static  Sitting - Level of Assistance: 6: Modified independent (Device/Increase time) Dynamic Sitting Balance Dynamic Sitting - Balance Support: Feet supported;Bilateral upper extremity supported Dynamic Sitting - Level of Assistance: 6: Modified independent (Device/Increase time) Sitting balance - Comments: prefers UE support Static Standing Balance Static Standing - Balance Support: During functional activity;Bilateral upper extremity supported Static Standing - Level of Assistance: 5: Stand by assistance Dynamic Standing Balance Dynamic Standing - Balance Support: During functional activity;Bilateral upper extremity supported Dynamic Standing - Level of Assistance: 5: Stand by assistance;Other (comment) Extremity/Trunk Assessment RUE Assessment RUE Assessment: Within Functional Limits LUE Assessment LUE Assessment: Within Functional Limits Active Range of Motion (AROM) Comments: Pt demonstrating improved attention to L side of body and L visual field   Janey Genta 11/25/2021, 3:50 PM

## 2021-11-25 NOTE — Progress Notes (Signed)
Speech Language Pathology Discharge Summary  Patient Details  Name: Troy Randall MRN: [id number] Date of Birth: 12-12-1960  Date of Discharge from SLP service:November 25, 2021  {chl ip rehab slp time calculations:[id number]}   Skilled Therapeutic Interventions: Skilled ST treatment focused on cognitive goals. Pt received in bed on arrival and accompanied by his daughter. SLP facilitated a basic thought organization task with overall mod A verbal cues; basic problem solving/reasoning by attribute with functional grocery items (salty, sweet, crunchy, soft, large, small) with mod A verbal/visual cues; mod A for sustained attention for 5 minute intervals. Pt was oriented to place with sup A verbal cues, time and situation with mod A given field of choices. Pt exhibited overall increased speed of processing and improved accuracy with problem solving skills. Patient was left in bed with alarm activated and immediate needs within reach at end of session.     Patient has met   of   long term goals.  Patient to discharge at overall   level.  Reasons goals not met:     Clinical Impression/Discharge Summary:    Care Partner:       Recommendation:         Equipment:     Reasons for discharge:          Patty Sermons 11/25/2021, 2:39 PM

## 2021-11-25 NOTE — Plan of Care (Signed)
Required placement of foley for urinary retention instead of I+O caths at discharge with urology follow up after discharge

## 2021-11-25 NOTE — Progress Notes (Signed)
Physical Therapy Discharge Summary  Patient Details  Name: Troy Randall MRN: [id number] Date of Birth: 10/06/1960  Date of Discharge from PT service:November 25, 2021  Today's Date: 11/25/2021 PT Individual Time: 1110-1204 PT Individual Time Calculation (min): 54 min    Patient has met 9 of 9 long term goals due to improved activity tolerance, improved balance, improved postural control, increased strength, ability to compensate for deficits, functional use of  left upper extremity and left lower extremity, improved attention, improved awareness, and improved coordination.  Patient to discharge at an ambulatory level Supervision/CGA using RW. Patient's care partner attended hands-on education/training and is independent to provide the necessary physical and cognitive assistance at discharge.  All goals met.   Recommendation:  Patient will benefit from ongoing skilled PT services in home health setting to continue to advance safe functional mobility, address ongoing impairments in activity tolerance, B LE strengthening, dynamic standing balance, dynamic gait training using LRAD, situation awareness with mobility, and minimize fall risk.  Equipment: RW, [id number] wheelchair  with cushion and anti-tippers, and power operated hospital bed  Reasons for discharge: treatment goals met and discharge from hospital  Patient/family agrees with progress made and goals achieved: Yes  Skilled Therapeutic Interventions/Progress Updates:  Pt received supine in bed and agreeable to therapy session. Supine>sitting L EOB, HOB partially elevated and using bedrail as pt will have hospital bed at home, with supervision (pt requires these bed features to come into sitting without physical assistance). Donned tennis shoes sitting EOB with supervision and increased time. Sit<>stands using RW with supervision during session. Gait training ~386f to ortho gym using RW with close supervision for safety - pt leans  forward onto B UE support on AD due to fatigue/impaired endurance and continues to have slight increased postural sway. Simulated ambulatory car transfer (SUV height) using RW with CGA for steadying/safety and pt requiring cuing to recall need to turn around and sit on seat prior to placing feet in. Patient participated in BTonyand demonstrates increased fall risk as noted by score of  33/56; however, this is a significant improvement from the 7/56 he scored on 11/13/21. (<36= high risk for falls, close to 100%; 37-45 significant >80%; 46-51 moderate >50%; 52-55 lower >25%). Gait training back to his room as described above. Sit>L sidelying supervision. Pt left L sidelying in bed with needs in reach, bed alarm on, and lines intact.  PT Discharge Precautions/Restrictions Precautions Precautions: Fall;Other (comment) Precaution Comments: R upper quadrant drain Restrictions Weight Bearing Restrictions: No Other Position/Activity Restrictions: pt unable to sit on bottom for extended periods of time due to pain (even on geomat) Pain Pain Assessment Pain Scale: 0-10 Pain Score: 0-No pain Pain Interference Pain Interference Pain Effect on Sleep: 8. Unable to answer (pt unable to recall if he has had pain due to impaired memory but per chart review pain has not limited his participation in therapy nor daily activities) Pain Interference with Therapy Activities: 8. Unable to answer;1. Rarely or not at all Pain Interference with Day-to-Day Activities: 8. Unable to answer;1. Rarely or not at all Vision/Perception  Perception Perception: Impaired Inattention/Neglect: Does not attend to left side of body;Does not attend to left visual field (continues to decreased L attention; however, improved since initial evaluation) Praxis Praxis: Impaired Praxis Impairment Details: Motor planning;Perseveration;Ideomotor (improved since initial evaluation)  Cognition Overall Cognitive Status:  Impaired/Different from baseline Arousal/Alertness: Awake/alert Orientation Level: Oriented to person;Oriented to place;Oriented to situation;Disoriented to time Year: 2023 Month: February (pt  aware that Thanksgiving is on Thursday but unable to recall what month that occurs in) Day of Week: Incorrect Attention: Focused;Sustained;Selective Focused Attention: Appears intact Sustained Attention: Appears intact Selective Attention: Impaired Memory: Impaired Awareness: Impaired Problem Solving: Impaired Behaviors: Restless;Perseveration Safety/Judgment: Impaired Sensation Sensation Light Touch: Appears Intact Hot/Cold: Not tested Proprioception: Impaired by gross assessment (continues to have minor scissoring during gait that worsens with fatigue although improved since initial evaluation) Stereognosis: Not tested Coordination Gross Motor Movements are Fluid and Coordinated: No Coordination and Movement Description: GM movements significantly improved since initial evaluation; however, continues to have minor B LE scissoring during gait Motor  Motor Motor: Other (comment);Abnormal postural alignment and control;Motor perseverations Motor - Discharge Observations: generalized weakness and deconditioning with impaired coordination that has improved significantly since initial evaluation  Mobility Bed Mobility Bed Mobility: Supine to Sit;Sit to Supine Supine to Sit: Supervision/Verbal cueing (using hospital bed features because pt will have these at home) Sit to Supine: Supervision/Verbal cueing (using hospital bed features because pt will have these at home) Transfers Transfers: Sit to Stand;Stand to Sit;Stand Pivot Transfers Sit to Stand: Supervision/Verbal cueing Stand to Sit: Supervision/Verbal cueing Stand Pivot Transfers: Supervision/Verbal cueing Stand Pivot Transfer Details: Verbal cues for safe use of DME/AE;Verbal cues for precautions/safety;Verbal cues for technique;Verbal  cues for gait pattern Transfer (Assistive device): Rolling walker Locomotion  Gait Ambulation: Yes Gait Assistance: Supervision/Verbal cueing;Contact Guard/Touching assist (supervision up to 139f but requries CGA when becomes fatigued) Gait Distance (Feet): 300 Feet Assistive device: Rolling walker Gait Assistance Details: Verbal cues for precautions/safety;Verbal cues for safe use of DME/AE;Verbal cues for gait pattern Gait Gait: Yes Gait Pattern: Impaired Gait Pattern: Narrow base of support;Step-through pattern;Trunk flexed Stairs / Additional Locomotion Stairs: Yes Stairs Assistance: Contact Guard/Touching assist Stair Management Technique: Two rails;Forwards Number of Stairs: 8 Height of Stairs: 6 Ramp: Contact Guard/touching assist (using RW) Curb: Contact Guard/Touching assist (using RW) Wheelchair Mobility Wheelchair Mobility: No  Trunk/Postural Assessment  Cervical Assessment Cervical Assessment: Within Functional Limits Thoracic Assessment Thoracic Assessment: Exceptions to [facility](thoracic rounding) Lumbar Assessment Lumbar Assessment: Exceptions to [facility](posterior pelvic tilt in sitting) Postural Control Postural Control: Within Functional Limits (WFL using B UE support on RW for basic functional tasks with close supervision)  Balance Balance Balance Assessed: Yes Standardized Balance Assessment Standardized Balance Assessment: Berg Balance Test (pt requires significantly fewer seated rest breaks although still needing them after a couple of test items and pt able to toelrate standing for a full 253mutes!) Berg Balance Test Sit to Stand: Able to stand  independently using hands Standing Unsupported: Able to stand 2 minutes with supervision Sitting with Back Unsupported but Feet Supported on Floor or Stool: Able to sit safely and securely 2 minutes Stand to Sit: Sits safely with minimal use of hands Transfers: Able to transfer with verbal cueing and /or  supervision Standing Unsupported with Eyes Closed: Able to stand 10 seconds with supervision Standing Ubsupported with Feet Together: Able to place feet together independently and stand for 1 minute with supervision From Standing, Reach Forward with Outstretched Arm: Reaches forward but needs supervision From Standing Position, Pick up Object from Floor: Able to pick up shoe, needs supervision From Standing Position, Turn to Look Behind Over each Shoulder: Turn sideways only but maintains balance (some difficulty following the instructions) Turn 360 Degrees: Needs close supervision or verbal cueing Standing Unsupported, Alternately Place Feet on Step/Stool: Able to complete >2 steps/needs minimal assist Standing Unsupported, One Foot in Front: Able to take small step  independently and hold 30 seconds Standing on One Leg: Tries to lift leg/unable to hold 3 seconds but remains standing independently Total Score: 33 Static Sitting Balance Static Sitting - Balance Support: Feet supported Static Sitting - Level of Assistance: 6: Modified independent (Device/Increase time) Dynamic Sitting Balance Dynamic Sitting - Balance Support: Feet supported;Bilateral upper extremity supported Dynamic Sitting - Level of Assistance: 6: Modified independent (Device/Increase time) Static Standing Balance Static Standing - Balance Support: During functional activity;Bilateral upper extremity supported Static Standing - Level of Assistance: 5: Stand by assistance Dynamic Standing Balance Dynamic Standing - Balance Support: During functional activity;Bilateral upper extremity supported Dynamic Standing - Level of Assistance: 5: Stand by assistance;Other (comment) (CGA) Extremity Assessment      RLE Assessment RLE Assessment: Exceptions to [facility] General Strength Comments: assessed in sitting, improved ability to follow commands to participate in strength assessment with resistance RLE Strength Right Hip Flexion:  3+/5 Right Knee Flexion: 4+/5 Right Knee Extension: 4+/5 Right Ankle Dorsiflexion: 4/5 Right Ankle Plantar Flexion: 4/5 LLE Assessment LLE Assessment: Exceptions to [facility] General Strength Comments: assessed in sitting, improved ability to follow commands to participate in strength assessment with resistance LLE Strength Left Hip Flexion: 3+/5 Left Knee Flexion: 4+/5 Left Knee Extension: 4+/5 Left Ankle Dorsiflexion: 4/5 Left Ankle Plantar Flexion: 4/5   Charmayne Odell M Rosaisela Jamroz , PT, DPT, NCS, CSRS 11/25/2021, 8:07 AM

## 2021-11-25 NOTE — Plan of Care (Signed)
Problem: RH Awareness Goal: LTG: Patient will demonstrate awareness during functional activites type of (OT) Description: LTG: Patient will demonstrate awareness during functional activites type of (OT) Outcome: Not Met (add Reason) Flowsheets (Taken 11/13/2021 1725) Patient will demonstrate awareness during functional activites type of: Intellectual LTG: Patient will demonstrate awareness during functional activites type of (OT): Minimal Assistance - Patient > 75% Note: Pt currently requires mod verbal cues to realize when a mistake has occurred during functional activities and ADLs. Pt benefits from repetition and direct cues to provide insight to when he has made a mistake.    Problem: RH Balance Goal: LTG: Patient will maintain dynamic sitting balance (OT) Description: LTG:  Patient will maintain dynamic sitting balance with assistance during activities of daily living (OT) Outcome: Completed/Met Flowsheets (Taken 11/13/2021 1725) LTG: Pt will maintain dynamic sitting balance during ADLs with: Supervision/Verbal cueing Goal: LTG Patient will maintain dynamic standing with ADLs (OT) Description: LTG:  Patient will maintain dynamic standing balance with assist during activities of daily living (OT)  Outcome: Completed/Met Flowsheets (Taken 11/13/2021 1725) LTG: Pt will maintain dynamic standing balance during ADLs with: Supervision/Verbal cueing   Problem: Sit to Stand Goal: LTG:  Patient will perform sit to stand in prep for activites of daily living with assistance level (OT) Description: LTG:  Patient will perform sit to stand in prep for activites of daily living with assistance level (OT) Outcome: Completed/Met Flowsheets (Taken 11/13/2021 1725) LTG: PT will perform sit to stand in prep for activites of daily living with assistance level: Supervision/Verbal cueing   Problem: RH Grooming Goal: LTG Patient will perform grooming w/assist,cues/equip (OT) Description: LTG: Patient will  perform grooming with assist, with/without cues using equipment (OT) Outcome: Completed/Met Flowsheets (Taken 11/13/2021 1725) LTG: Pt will perform grooming with assistance level of: Supervision/Verbal cueing   Problem: RH Bathing Goal: LTG Patient will bathe all body parts with assist levels (OT) Description: LTG: Patient will bathe all body parts with assist levels (OT) Outcome: Completed/Met Flowsheets (Taken 11/13/2021 1725) LTG: Pt will perform bathing with assistance level/cueing: Supervision/Verbal cueing   Problem: RH Dressing Goal: LTG Patient will perform upper body dressing (OT) Description: LTG Patient will perform upper body dressing with assist, with/without cues (OT). Outcome: Completed/Met Flowsheets (Taken 11/13/2021 1725) LTG: Pt will perform upper body dressing with assistance level of: Supervision/Verbal cueing Goal: LTG Patient will perform lower body dressing w/assist (OT) Description: LTG: Patient will perform lower body dressing with assist, with/without cues in positioning using equipment (OT) Outcome: Completed/Met Flowsheets (Taken 11/13/2021 1725) LTG: Pt will perform lower body dressing with assistance level of: Supervision/Verbal cueing   Problem: RH Toileting Goal: LTG Patient will perform toileting task (3/3 steps) with assistance level (OT) Description: LTG: Patient will perform toileting task (3/3 steps) with assistance level (OT)  Outcome: Completed/Met Flowsheets (Taken 11/21/2021 1245) LTG: Pt will perform toileting task (3/3 steps) with assistance level: Minimal Assistance - Patient > 75%   Problem: RH Toilet Transfers Goal: LTG Patient will perform toilet transfers w/assist (OT) Description: LTG: Patient will perform toilet transfers with assist, with/without cues using equipment (OT) Outcome: Completed/Met Flowsheets (Taken 11/13/2021 1725) LTG: Pt will perform toilet transfers with assistance level of: Supervision/Verbal cueing   Problem: RH  Tub/Shower Transfers Goal: LTG Patient will perform tub/shower transfers w/assist (OT) Description: LTG: Patient will perform tub/shower transfers with assist, with/without cues using equipment (OT) Outcome: Completed/Met Flowsheets (Taken 11/13/2021 1725) LTG: Pt will perform tub/shower stall transfers with assistance level of: Supervision/Verbal cueing  Problem: RH Memory Goal: LTG Patient will demonstrate ability for day to day recall/carry over during activities of daily living with assistance level (OT) Description: LTG:  Patient will demonstrate ability for day to day recall/carry over during activities of daily living with assistance level (OT). Outcome: Completed/Met Flowsheets (Taken 11/13/2021 1725) LTG:  Patient will demonstrate ability for day to day recall/carry over during activities of daily living with assistance level (OT): Minimal Assistance - Patient > 75%

## 2021-11-25 NOTE — Consult Note (Signed)
Neuropsychological Consultation   Patient:   Troy Randall   DOB:   25-Jun-1960  MR Number:  [id number]  Location:  [place] [address] [place] [id number] [place] [address] Dept: [place]: [phone]           Date of Service:   11/25/2021  Start Time:   1:45 PM End Time:   2: 45 PM  Provider/Observer:  Ilean Skill, Psy.D.       Clinical Neuropsychologist       Billing Code/Service: [phone]  Chief Complaint:    Troy Randall is a 61 year old right-handed male referred for neuropsychological consultation due to ongoing cognitive difficulties consistent with improving encephalopathic status.  Patient has an extensive medical history and long series of medical events dating back to the end of August 2023 after presenting to [facility] emergency department with GI deficits.  The patient has a past medical history prior to this of 2 previous CVAs with full recovery from both.  Patient with endoscopic interventions and acute illness with respiratory failure.  Eventually, patient developed acute encephalopathy and head CT/MRI was performed with the patient having a prior history of CVA.  Imaging indicated multiple interval white matter insults versus inflammatory process.  Patient underwent laboratory workup including lumbar puncture and treated with IV steroids with mild improvement.  Patient underwent full course of plasma exchange completed on 10/11.  MRI completed of cervical and thoracic spine.  Patient with extended hospital stay and is now being treated at the comprehensive inpatient rehabilitation unit with planned discharge tomorrow.  Patient with ongoing but improving cognitive status.  Patient is reported to really have become somewhat aware and oriented approximately 1 month after 2 months of significant cognitive impairments and medical illness.  Reason for Service:  Neuropsychological  consultation was performed due to ongoing cognitive deficits secondary to encephalopathic status that is improving likely due to Acute disseminated encephalomyelitis (ADEM).  Below is the HPI for the current admission.  HPI: Troy Randall is a 60 year old R handed  male who presented to [facility] emergency department on 09/04/2021 reporting hematochezia associated with diarrhea and abdominal cramping.  He has a past medical history of CVA x2 ( had fully recovered from both- latest 4/23) maintained on Plavix and aspirin.  He had had a normal colonoscopy in February 2023.  Gastroenterology was consulted.  He underwent EGD with deployment of endoscopic capsule and colonoscopy on 9/2.  Interventional radiology was consulted and he underwent coil of the jejunal branch of the SMA on 9/2. Active bleeding noted in the proximal jejunum which was tattooed and hemostatic clip placed.  He underwent flexible sigmoidoscopy on 9/15 to retrieve endoscopic capsule and colonoscopy was performed on 10/3 with findings of large superficial ulceration of the sigmoid colon.  This was biopsied and consistent with chronic colitis.          He required approximately 20 units of packed red blood cells.  His hemoglobin is stable and improving. His hospitalization was complicated by cholecystitis and he underwent percutaneous cholecystostomy tube placement, acute renal failure requiring CRRT and respiratory failure with subsequent tracheostomy 9/11.  LFTs and renal function are normal. CT head performed secondary to acute encephalopathy and history of prior CVA.  MRI of the brain performed secondary to multiple interval white matter insults versus inflammatory process.  Neurology consultation obtained on 9/17.  The patient underwent laboratory work-up including lumbar puncture.  He was treated  with IV steroids with mild improvement.  He then underwent full course of plasma exchange completed on 10/11.  MRI completed of the cervical and  thoracic spine.  He will follow-up with neurology as outpatient.   His Foley catheter was removed on 10/9 however he was unable to void spontaneously and his catheter was reinserted 10/10.   He was admitted to rehab 10/30/2021 and noted to have increased frequency of stools and C. difficile toxin checked.  Protonix increased to twice daily dosing.  C. difficile toxin negative.  Follow-up labs stable on 10/30.  Patient was ambulating 95 feet with physical therapy and due to history of GI bleed, Lovenox was discontinued and SCDs applied. Flomax discontinued and doxazosin continued for urinary retention. Complained of low back pain and bilateral shoulder pain and Lidoderm patches were changed to 3 patches from 8 PM to 8 AM and to continue Tylenol.  Continued on dysphagia 3 diet with nectar thick liquids. Voiding trial on 11/2. At approximately 6:30 pm, RN noted rigors and rectal temp of 100. Sepsis work-up initiated. Recheck of vitals signs with heart rate in 160s. Rapid response called. Transferred to acute care/[name] service to monitored bed for SVT and sepsis work-up.  Blood cultures were positive for E. coli.  Intravenous cephalosporins initiated.  CT of the abdomen pelvis without signs of deep infection or collection.  He has remained in sinus rhythm on metoprolol.  Intravenous cefazolin changed to Augmentin which she will continue through 11/11.  Foley catheter remains in place.  Elevated liver function tests trending downward.  Continue drain care.  H&H have remained stable that reports of recurrent GI bleeding.The patient requires inpatient physical medicine and rehabilitation evaluations and treatment secondary to dysfunction due to multiple medical issues including disseminated encephalomyelitis, urosepsis. Patient feels ready to return to CIR today.   Current Status:  Patient was in his room as well as his daughter when I entered the room.  Patient with clear cognitive deficits with expressive language  limitations, slowed information processing speed and improved but ongoing significant memory deficits.  Patient describes extended retrograde and anterograde amnesia throughout the time of his critical illness.  Patient dysphoric for the most part it is aware of his limitations and aware that he is having significant difficulties with cognition.  Patient is oriented to being in the hospital but mental status is poor for full aspect/range of orientation.  Patient reports some mood disturbance and confusion with difficulties coping with experiences during acute illness.    Behavioral Observation: Troy Randall  presents as a 61 y.o.-year-old Right handed Caucasian Male who appeared his stated age. his dress was Appropriate and he was Well Groomed and his manners were Appropriate to the situation.  his participation was indicative of Inattentive and Redirectable behaviors.  There were physical disabilities noted.  he displayed an appropriate level of cooperation and motivation.     Interactions:    Active Inattentive  Attention:   abnormal and attention span appeared shorter than expected for age  Memory:   abnormal; global memory impairment noted  Visuo-spatial:  not examined  Speech (Volume):  low  Speech:   non-fluent aphasia; slurred  Thought Process:  Circumstantial and Disorganized  Though Content:  WNL; not suicidal and not homicidal  Orientation:   person and place  Judgment:   Poor  Planning:   Poor  Affect:    Depressed and Lethargic  Mood:    Dysphoric  Insight:   Shallow  Intelligence:  normal  Medical History:   Past Medical History:  Diagnosis Date   Anemia    Arthritis    CVA (cerebral vascular accident) ([place])    Diabetes mellitus without complication ([place])    Hyperlipidemia    Hypertension    Kidney stones          Patient Active Problem List   Diagnosis Date Noted   Acute disseminated encephalomyelitis 11/14/2021   Debility 11/12/2021   Anemia of  chronic disease 11/11/2021   Acute cystitis 11/07/2021   Hypomagnesemia 11/07/2021   Generalized weakness 11/07/2021   Sepsis ([place]) 11/07/2021   Class 2 obesity 11/07/2021   Sepsis due to gram-negative UTI ([facility]) 11/07/2021   SVT (supraventricular tachycardia) 11/06/2021   Encephalomyelopathy 10/30/2021   MS (multiple sclerosis) ([place]) 10/30/2021   Acute respiratory insufficiency 10/23/2021   Pressure injury of skin 10/07/2021   Anemia due to blood loss 09/18/2021   History of lower GI bleeding: 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  09/18/2021   History of acute cholecystitis 09/18/2021   Severe sepsis ([place]) 09/18/2021   Acute emphysematous cholecystitis 09/18/2021   Chronic respiratory failure with hypoxia ([facility])    Tracheostomy status ([place])    Acute metabolic encephalopathy    AKI (acute kidney injury) ([place])    Acute CVA (cerebrovascular accident) ([place]) 04/28/2021   Cannot sleep 08/26/2015   Chronic right-sided low back pain with sciatica 07/10/2015   DM2 (diabetes mellitus, type 2) ([place]) 06/19/2015   Hyperlipidemia 11/29/2013   Essential hypertension 11/28/2013    Psychiatric History:  No prior psychiatric history  Family Med/Psych History:  Family History  Problem Relation Age of Onset   Heart failure Mother    Diabetes Father    Cancer Other    Heart attack Other    Anesthesia problems Neg Hx    Hypotension Neg Hx    Malignant hyperthermia Neg Hx    Pseudochol deficiency Neg Hx    Colon cancer Neg Hx    Inflammatory bowel disease Neg Hx     Impression/DX:  Troy Randall is a 61 year old right-handed male referred for neuropsychological consultation due to ongoing cognitive difficulties consistent with improving encephalopathic status.  Patient has an extensive medical history and long series of medical events dating back to the end of August 2023 after presenting to [facility] emergency department with GI deficits.  The patient has a past medical  history prior to this of 2 previous CVAs with full recovery from both.  Patient with endoscopic interventions and acute illness with respiratory failure.  Eventually, patient developed acute encephalopathy and head CT/MRI was performed with the patient having a prior history of CVA.  Imaging indicated multiple interval white matter insults versus inflammatory process.  Patient underwent laboratory workup including lumbar puncture and treated with IV steroids with mild improvement.  Patient underwent full course of plasma exchange completed on 10/11.  MRI completed of cervical and thoracic spine.  Patient with extended hospital stay and is now being treated at the comprehensive inpatient rehabilitation unit with planned discharge tomorrow.  Patient with ongoing but improving cognitive status.  Patient is reported to really have become somewhat aware and oriented approximately 1 month after 2 months of significant cognitive impairments and medical illness.  Patient was in his room as well as his daughter when I entered the room.  Patient with clear cognitive deficits with expressive language limitations, slowed information processing speed and improved but ongoing significant memory deficits.  Patient  describes extended retrograde and anterograde amnesia throughout the time of his critical illness.  Patient dysphoric for the most part it is aware of his limitations and aware that he is having significant difficulties with cognition.  Patient is oriented to being in the hospital but mental status is poor for full aspect/range of orientation.  Patient reports some mood disturbance and confusion with difficulties coping with experiences during acute illness.  Disposition/Plan:  I will likely be following up with the patient outpatient as he recovers from his encephalomyelitis although the patient may recover quite well with time he continues to have significant cognitive deficits with slow recovery.  Diagnosis:     Acute disseminated encephalomyelitis (ADEM)         Electronically Signed   _______________________ Arley Phenix, Psy.D. Clinical Neuropsychologist

## 2021-11-26 LAB — GLUCOSE, CAPILLARY: Glucose-Capillary: 81 mg/dL (ref 70–99)

## 2021-11-26 LAB — POTASSIUM: Potassium: 3.2 mmol/L — ABNORMAL LOW (ref 3.5–5.1)

## 2021-11-26 MED ORDER — TRAZODONE HCL 50 MG PO TABS: 25.0000 mg | ORAL_TABLET | Freq: Every evening | ORAL | Status: DC | PRN

## 2021-11-26 MED ORDER — METOPROLOL TARTRATE 25 MG PO TABS: 25.0000 mg | ORAL_TABLET | Freq: Two times a day (BID) | ORAL | 0 refills | Status: DC

## 2021-11-26 MED ORDER — TAMSULOSIN HCL 0.4 MG PO CAPS: 0.4000 mg | ORAL_CAPSULE | Freq: Every day | ORAL | 0 refills | Status: DC

## 2021-11-26 MED ORDER — TRESIBA FLEXTOUCH 200 UNIT/ML ~~LOC~~ SOPN: PEN_INJECTOR | SUBCUTANEOUS | 0 refills | Status: AC

## 2021-11-26 MED ORDER — POTASSIUM CHLORIDE 20 MEQ PO PACK
40.0000 meq | PACK | Freq: Once | ORAL | Status: AC
  Administered 2021-11-26: 40 meq via ORAL
  Filled 2021-11-26: qty 2

## 2021-11-26 MED ORDER — TRESIBA FLEXTOUCH 200 UNIT/ML ~~LOC~~ SOPN: 15.0000 [IU] | PEN_INJECTOR | Freq: Two times a day (BID) | SUBCUTANEOUS | 0 refills | Status: DC

## 2021-11-26 MED ORDER — PANTOPRAZOLE SODIUM 40 MG PO TBEC: 40.0000 mg | DELAYED_RELEASE_TABLET | Freq: Every day | ORAL | 0 refills | Status: AC

## 2021-11-26 MED ORDER — ACETAMINOPHEN 325 MG PO TABS: 325.0000 mg | ORAL_TABLET | ORAL | Status: DC | PRN

## 2021-11-26 MED ORDER — METHOCARBAMOL 500 MG PO TABS: 500.0000 mg | ORAL_TABLET | Freq: Four times a day (QID) | ORAL | 0 refills | Status: DC | PRN

## 2021-11-26 NOTE — Progress Notes (Signed)
Inpatient Rehabilitation Discharge Medication Review by a Pharmacist  A complete drug regimen review was completed for this patient to identify any potential clinically significant medication issues.  High Risk Drug Classes Is patient taking? Indication by Medication  Antipsychotic No   Anticoagulant No   Antibiotic No   Opioid No   Antiplatelet No   Hypoglycemics/insulin Yes Tresiba: diabetes  Vasoactive Medication Yes Lopressor: SVT Flomax: Urinary retention  Chemotherapy No   Other Yes Tylenol: pain Robaxin: muscle spasms Crestor: hyperlipidemia Protonix: GI bleed/Reflux/GERD Trazodone: sleep     Type of Medication Issue Identified Description of Issue Recommendation(s)  Drug Interaction(s) (clinically significant)     Duplicate Therapy     Allergy     No Medication Administration End Date     Incorrect Dose     Additional Drug Therapy Needed     Significant med changes from prior encounter (inform family/care partners about these prior to discharge).    Other       Clinically significant medication issues were identified that warrant physician communication and completion of prescribed/recommended actions by midnight of the next day:  No     Time spent performing this drug regimen review (minutes): 20   Thank you for allowing [place] to participate in this patients care. Signe Colt, PharmD 11/26/2021 11:39 AM  **Pharmacist phone directory can be found on [email or web address] listed under [facility]**

## 2021-11-26 NOTE — Plan of Care (Signed)
  Problem: RH Cognition - SLP Goal: RH LTG Patient will demonstrate orientation with cues Description:  LTG:  Patient will demonstrate orientation to person/place/time/situation with cues (SLP)   Outcome: Not Met (add Reason)   Problem: RH Memory Goal: LTG Patient will use memory compensatory aids to (SLP) Description: LTG:  Patient will use memory compensatory aids to recall biographical/new, daily complex information with cues (SLP) Outcome: Not Met (add Reason)   Problem: RH Attention Goal: LTG Patient will demonstrate this level of attention during functional activites (SLP) Description: LTG:  Patient will will demonstrate this level of attention during functional activites (SLP) Outcome: Not Met (add Reason)   Problem: RH Comprehension Communication Goal: LTG Patient will comprehend basic/complex auditory (SLP) Description: LTG: Patient will comprehend basic/complex auditory information with cues (SLP). Outcome: Completed/Met   Problem: RH Problem Solving Goal: LTG Patient will demonstrate problem solving for (SLP) Description: LTG:  Patient will demonstrate problem solving for basic/complex daily situations with cues  (SLP) Outcome: Completed/Met   Problem: RH Awareness Goal: LTG: Patient will demonstrate awareness during functional activites type of (SLP) Description: LTG: Patient will demonstrate awareness during functional activites type of (SLP) Outcome: Completed/Met

## 2021-11-26 NOTE — Progress Notes (Signed)
Patient ID: Troy Randall, male   DOB: 11/13/1960, 61 y.o.   MRN: [id number]  SW followed with patient and spouse to discussion discharge information. Spouse reports she has not received pt's shower chair or bsc, SW contacted [employer], Sw will follow up. No additional questions or concerns.

## 2021-11-30 DIAGNOSIS — M1990 Unspecified osteoarthritis, unspecified site: Secondary | ICD-10-CM | POA: Diagnosis not present

## 2021-11-30 DIAGNOSIS — J9611 Chronic respiratory failure with hypoxia: Secondary | ICD-10-CM | POA: Diagnosis not present

## 2021-11-30 DIAGNOSIS — Z466 Encounter for fitting and adjustment of urinary device: Secondary | ICD-10-CM | POA: Diagnosis not present

## 2021-11-30 DIAGNOSIS — I10 Essential (primary) hypertension: Secondary | ICD-10-CM | POA: Diagnosis not present

## 2021-11-30 DIAGNOSIS — E119 Type 2 diabetes mellitus without complications: Secondary | ICD-10-CM | POA: Diagnosis not present

## 2021-11-30 DIAGNOSIS — M25512 Pain in left shoulder: Secondary | ICD-10-CM | POA: Diagnosis not present

## 2021-11-30 DIAGNOSIS — Z794 Long term (current) use of insulin: Secondary | ICD-10-CM | POA: Diagnosis not present

## 2021-11-30 DIAGNOSIS — G0400 Acute disseminated encephalitis and encephalomyelitis, unspecified: Secondary | ICD-10-CM | POA: Diagnosis not present

## 2021-11-30 DIAGNOSIS — E669 Obesity, unspecified: Secondary | ICD-10-CM | POA: Diagnosis not present

## 2021-11-30 DIAGNOSIS — Z8673 Personal history of transient ischemic attack (TIA), and cerebral infarction without residual deficits: Secondary | ICD-10-CM | POA: Diagnosis not present

## 2021-11-30 DIAGNOSIS — R69 Illness, unspecified: Secondary | ICD-10-CM | POA: Diagnosis not present

## 2021-11-30 DIAGNOSIS — Z436 Encounter for attention to other artificial openings of urinary tract: Secondary | ICD-10-CM | POA: Diagnosis not present

## 2021-11-30 DIAGNOSIS — K529 Noninfective gastroenteritis and colitis, unspecified: Secondary | ICD-10-CM | POA: Diagnosis not present

## 2021-11-30 DIAGNOSIS — Z9181 History of falling: Secondary | ICD-10-CM | POA: Diagnosis not present

## 2021-11-30 DIAGNOSIS — F419 Anxiety disorder, unspecified: Secondary | ICD-10-CM | POA: Diagnosis not present

## 2021-11-30 DIAGNOSIS — Z6834 Body mass index (BMI) 34.0-34.9, adult: Secondary | ICD-10-CM | POA: Diagnosis not present

## 2021-11-30 DIAGNOSIS — R339 Retention of urine, unspecified: Secondary | ICD-10-CM | POA: Diagnosis not present

## 2021-11-30 DIAGNOSIS — E785 Hyperlipidemia, unspecified: Secondary | ICD-10-CM | POA: Diagnosis not present

## 2021-11-30 DIAGNOSIS — G35 Multiple sclerosis: Secondary | ICD-10-CM | POA: Diagnosis not present

## 2021-11-30 DIAGNOSIS — Z87440 Personal history of urinary (tract) infections: Secondary | ICD-10-CM | POA: Diagnosis not present

## 2021-11-30 DIAGNOSIS — M25511 Pain in right shoulder: Secondary | ICD-10-CM | POA: Diagnosis not present

## 2021-11-30 DIAGNOSIS — D649 Anemia, unspecified: Secondary | ICD-10-CM | POA: Diagnosis not present

## 2021-12-02 DIAGNOSIS — N390 Urinary tract infection, site not specified: Secondary | ICD-10-CM | POA: Diagnosis not present

## 2021-12-02 DIAGNOSIS — Z960 Presence of urogenital implants: Secondary | ICD-10-CM | POA: Diagnosis not present

## 2021-12-08 DIAGNOSIS — E1140 Type 2 diabetes mellitus with diabetic neuropathy, unspecified: Secondary | ICD-10-CM | POA: Diagnosis not present

## 2021-12-08 NOTE — Progress Notes (Unsigned)
[facility]  PATIENT: Troy Randall DOB: [birth year]  REFERRING DOCTOR OR PCP:  Dr. Nevada Crane SOURCE: Patient, extensive notes, laboratory tests, imaging results, other results from long hospitalization.  Imaging studies were personally reviewed.  _________________________________   HISTORICAL  CHIEF COMPLAINT:  Chief Complaint  Patient presents with   Follow-up    Pt room #11 with his son and wife. Pt here today for f/u from the hospital.    HISTORY OF PRESENT ILLNESS:  I had the pleasure seeing your patient, Troy Randall, at [facility] for neurologic consultation regarding his CNS demyelinating disease and history of strokes  He is a 61 year old man who had had 2 strokes in the past, both affecting his speech.  The last stroke was in April 2023.  MRI 04/28/2021 had shown a small acute stroke in the subcortical white matter of the left frontal lobe.  Additionally there were several lacunar infarctions in the cerebral hemispheres and brainstem  He was neurologically baseline until early September 2023.  He had a normal morning on 09/04/2021 but noted GI bleed prompting him to go to the ED.   he had jejunal bleeding on colonoscopy treated with embolization.  He was noted to have altered mental status a couple days later while in the hospital.  He was found to have sepsis.   He became less responsive.  He was noted to have renal failure and due to respiratory failure was intubated.      CT scan 09/12/2021 showed interval development of multiple white matter foci compared to April 2023 and MRI of the brain was recommended for further evaluation.  MRI of the brain 09/20/2021 and 09/21/2021 showed development of multiple enhancing foci in the hemispheres.  The differential diagnosis was multiple strokes versus demyelination.  CSF evaluation 09/22/2021 showed normal cell count and protein and cultures.  IgG index was normal.  Oligoclonal bands were negative (he  did have matched bands in both the serum and CSF).  HIV was negative.  He received IV steroids followed by plasmapheresis .  Due to GI bleeds he received multiple blood transfusions while in the hospital.  Initially, he had been unresponsive but had regained consciousness with treatment of the sepsis.  He continues to have reduced mental status for several weeks and began to improve towards the end of September, early October but remain mute for several more days.  He remained bedbound for several weeks and began to participate with PT more during October.  He was admitted to rehab on 10/30/2021.  He continues to do outpatient rehab.     Over the last month he has had multiple episodes of infection treated with antibiotics.  He is currently off of antibiotics.  He has improved physically and cognitively.   He can walk 50 feet with a walker now.  He communicates effectively and answers questions appropriately but cognition is not at baseline.  Imaging: MRI of the brain 09/20/2021 and 09/21/2021 showed multiple T2/FLAIR hyperintense foci in the hemispheres many of which enhanced after contrast.  Additional nonenhancing foci were noted in the cerebral hemispheres, left cerebellar hemisphere and left middle cerebellar peduncle.  All of the enhancing foci were new compared to the 04/28/2021 MRI and most of the nonenhancing foci were present on the previous MRI.  MRI of the brain 10/03/2021 showed multiple T2/FLAIR hyperintense foci in the cerebral hemispheres, cerebellum, left middle cerebellar peduncle, pons.  Some of the foci enhanced after contrast.  MRI of the cervical spine  10/03/2021 showed a normal spinal cord.  MRI of the thoracic spine 10/03/2021 showed subtle T2 hyperintense foci at T4-T5 and T6.  No definite enhancement noted.  Study was difficult to interpret due to movement artifact.  MRI of the brain 04/28/2021 showed several infratentorial and supratentorial lacunar infarctions.  There was an acute  small stroke in the left frontal lobe.  Chronic lacunar infarctions were noted in the left middle cerebellar peduncle, inferior cerebellum and in the cerebral hemispheres, left greater than right  He has had additioanl UTIs since d/c and just finished a round of antibiotics.     He is doing PT and has made progress but other medical issues have led to temporary setback.     REVIEW OF SYSTEMS: Constitutional: No fevers, chills, sweats, or change in appetite Eyes: No visual changes, double vision, eye pain Ear, nose and throat: No hearing loss, ear pain, nasal congestion, sore throat Cardiovascular: No chest pain, palpitations Respiratory:  No shortness of breath at rest or with exertion.   No wheezes GastrointestinaI: No nausea, vomiting, diarrhea, abdominal pain, fecal incontinence Genitourinary: Currently has Foley catheter musculoskeletal:  No neck pain, back pain Integumentary: No rash, pruritus, skin lesions Neurological: as above Psychiatric: No depression at this time.  No anxiety Endocrine: No palpitations, diaphoresis, change in appetite, change in weigh or increased thirst Hematologic/Lymphatic:  No anemia, purpura, petechiae. Allergic/Immunologic: No itchy/runny eyes, nasal congestion, recent allergic reactions, rashes  ALLERGIES: Allergies  Allergen Reactions   Metformin And Related     GI upset    HOME MEDICATIONS:  Current Outpatient Medications:    acetaminophen (TYLENOL) 325 MG tablet, Take 1-2 tablets (325-650 mg total) by mouth every 4 (four) hours as needed for mild pain., Disp: , Rfl:    BD VEO INSULIN SYRINGE U/F 31G X 15/64" 1 ML MISC,  USE AS DIRECTED, Disp: 100 each, Rfl: 2   glucose blood (ONETOUCH VERIO) test strip, TEST twice a day, Disp: 100 each, Rfl: 3   Insulin Pen Needle (NOVOTWIST) 32G X 5 MM MISC, Use two daily to inject Victoza and Toujeo., Disp: 90 each, Rfl: 5   methocarbamol (ROBAXIN) 500 MG tablet, Take 1 tablet (500 mg total) by mouth every 6  (six) hours as needed for muscle spasms., Disp: 30 tablet, Rfl: 0   metoprolol tartrate (LOPRESSOR) 25 MG tablet, Take 1 tablet (25 mg total) by mouth 2 (two) times daily., Disp: 60 tablet, Rfl: 0   ONETOUCH DELICA LANCETS 33G MISC, Use to check blood sugar once a day dx code E11.65, Disp: 50 each, Rfl: 3   pantoprazole (PROTONIX) 40 MG tablet, Take 1 tablet (40 mg total) by mouth daily., Disp: 30 tablet, Rfl: 0   rosuvastatin (CRESTOR) 40 MG tablet, Take 1 tablet (40 mg total) by mouth daily., Disp: 30 tablet, Rfl: 1   traZODone (DESYREL) 50 MG tablet, Take 0.5-1 tablets (25-50 mg total) by mouth at bedtime as needed for sleep., Disp: , Rfl:    TRESIBA FLEXTOUCH 200 UNIT/ML FlexTouch Pen, Inject under skin 15 units twice a day., Disp: 3 mL, Rfl: 0   tamsulosin (FLOMAX) 0.4 MG CAPS capsule, Take 1 capsule (0.4 mg total) by mouth daily after supper. (Patient not taking: Reported on 12/10/2021), Disp: 30 capsule, Rfl: 0  PAST MEDICAL HISTORY: Past Medical History:  Diagnosis Date   Anemia    Arthritis    CVA (cerebral vascular accident) (HCC)    Diabetes mellitus without complication (HCC)    Hyperlipidemia  Hypertension    Kidney stones     PAST SURGICAL HISTORY: Past Surgical History:  Procedure Laterality Date   ANKLE SURGERY     BIOPSY  09/10/2021   Procedure: BIOPSY;  Surgeon: Carol Ada, MD;  Location: [facility] ENDOSCOPY;  Service: Gastroenterology;;   BIOPSY  10/07/2021   Procedure: BIOPSY;  Surgeon: Carol Ada, MD;  Location: [facility] ENDOSCOPY;  Service: Gastroenterology;;   COLONOSCOPY N/A 10/07/2021   Procedure: COLONOSCOPY;  Surgeon: Carol Ada, MD;  Location: [place];  Service: Gastroenterology;  Laterality: N/A;   COLONOSCOPY WITH PROPOFOL N/A 09/06/2021   Procedure: COLONOSCOPY WITH PROPOFOL;  Surgeon: Harvel Quale, MD;  [facility];  Service: Gastroenterology;  Laterality: N/A;   ENTEROSCOPY N/A 09/10/2021   Procedure: ENTEROSCOPY;  Surgeon: Carol Ada, MD;  Location: [place];  Service: Gastroenterology;  Laterality: N/A;   ENTEROSCOPY  09/06/2021   Procedure: ENTEROSCOPY;  Surgeon: Harvel Quale, MD;  [facility];  Service: Gastroenterology;;   ESOPHAGOGASTRODUODENOSCOPY (EGD) WITH PROPOFOL  09/06/2021   Procedure: ESOPHAGOGASTRODUODENOSCOPY (EGD) WITH PROPOFOL;  Surgeon: Harvel Quale, MD;  [facility];  Service: Gastroenterology;;   [place] N/A 09/19/2021   Procedure: Beryle Quant;  Surgeon: Carol Ada, MD;  Location: [place];  Service: Gastroenterology;  Laterality: N/A;   FOREIGN BODY REMOVAL  09/19/2021   Procedure: FOREIGN BODY REMOVAL;  Surgeon: Carol Ada, MD;  Location: [place];  Service: Gastroenterology;;   Freda Munro CAPSULE STUDY  09/06/2021   Procedure: GIVENS CAPSULE STUDY;  Surgeon: Harvel Quale, MD;  [facility];  Service: Gastroenterology;;   HERNIA REPAIR     umbilical x1 Incisional x1   INCISIONAL HERNIA REPAIR  04/13/2011   Procedure: LAPAROSCOPIC INCISIONAL HERNIA;  Surgeon: Jamesetta So, MD;  Location: [facility];  Service: General;  Laterality: N/A;  Recurrent Laparoscopic Incisional Herniorraphy with Mesh   IR ANGIOGRAM SELECTIVE EACH ADDITIONAL VESSEL  09/09/2021   IR ANGIOGRAM VISCERAL SELECTIVE  09/07/2021   IR ANGIOGRAM VISCERAL SELECTIVE  09/06/2021   IR ANGIOGRAM VISCERAL SELECTIVE  09/06/2021   IR CHOLANGIOGRAM EXISTING TUBE  11/06/2021   IR EMBO ART  VEN HEMORR LYMPH EXTRAV  INC GUIDE ROADMAPPING  09/06/2021   IR EXCHANGE BILIARY DRAIN  10/24/2021   IR EXCHANGE BILIARY DRAIN  11/19/2021   IR GUIDED DRAIN W CATHETER PLACEMENT  09/07/2021   IR US GUIDE BX ASP/DRAIN  09/07/2021   IR US GUIDE VASC ACCESS RIGHT  09/07/2021   IR US GUIDE VASC ACCESS RIGHT  09/06/2021   KIDNEY STONE SURGERY      FAMILY HISTORY: Family History  Problem Relation Age of Onset   Heart failure Mother    Diabetes Father    Cancer Other     Heart attack Other    Anesthesia problems Neg Hx    Hypotension Neg Hx    Malignant hyperthermia Neg Hx    Pseudochol deficiency Neg Hx    Colon cancer Neg Hx    Inflammatory bowel disease Neg Hx     SOCIAL HISTORY: Social History   Socioeconomic History   Marital status: Married    Spouse name: Not on file   Number of children: Not on file   Years of education: Not on file   Highest education level: Not on file  Occupational History   Not on file  Tobacco Use   Smoking status: Never   Smokeless tobacco: Current    Types: Snuff  Vaping Use  Vaping Use: Never used  Substance and Sexual Activity   Alcohol use: No   Drug use: No   Sexual activity: Yes    Birth control/protection: None  Other Topics Concern   Not on file  Social History Narrative   Live in [place], [place].   Married for over 30 years.   Has 2 children, age 52/30s. Has one grandchild, 19 months.    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    Eats all food groups.   Wears seatbelt.    Dip 1 can a day.    Social Determinants of Health   Financial Resource Strain: Not on file  Food Insecurity: No Food Insecurity (09/29/2021)   Hunger Vital Sign    Worried About Running Out of Food in the Last Year: Never true    Ran Out of Food in the Last Year: Never true  Transportation Needs: No Transportation Needs (09/29/2021)   PRAPARE - Administrator, Civil Service (Medical): No    Lack of Transportation (Non-Medical): No  Physical Activity: Not on file  Stress: Not on file  Social Connections: Not on file  Intimate Partner Violence: Not At Risk (09/29/2021)   Humiliation, Afraid, Rape, and Kick questionnaire    Fear of Current or Ex-Partner: No    Emotionally Abused: No    Physically Abused: No    Sexually Abused: No       PHYSICAL EXAM  Vitals:   12/10/21 1510  BP: 107/65  Pulse: 68  Height: 6' (1.829 m)    Body mass index is 35.22  kg/m.   General: The patient is well-developed and well-nourished and in no acute distress.  He is in a wheelchair.  He has bile drainage and a Foley catheter  HEENT:  Head is [place]/AT.  Sclera are anicteric.  Funduscopic exam shows normal optic discs and retinal vessels.  Neck: No carotid bruits are noted.  The neck is nontender.  Cardiovascular: The heart has a regular rate and rhythm with a normal S1 and S2. There were no murmurs, gallops or rubs.    Skin: He has mild edema  Musculoskeletal:  Back is nontender  Neurologic Exam  Mental status: The patient is alert and answers most questions appropriately.  Focus/attention is reduced.  Speech is slowed but no definite aphasia.  Cranial nerves: Extraocular movements are full. Pupils are equal, round, and reactive to light and accomodation.  Visual fields are full.  Facial symmetry is present. There is good facial sensation to soft touch bilaterally.Facial strength is normal.  Trapezius and sternocleidomastoid strength is normal. No dysarthria is noted.  The tongue is midline, and the patient has symmetric elevation of the soft palate. No obvious hearing deficits are noted.  Motor:  Muscle bulk is normal.   Tone is normal. Strength is  5 / 5 in all 4 extremities in the arms, 4+/5 in legs.  Sensory: Sensory testing is intact to pinprick, soft touch and vibration sensation in all 4 extremities.  Coordination: Cerebellar testing reveals good finger-nose-finger and mildly reduced heel-to-shin bilaterally.  Gait and station: Although he uses his arms when he stands up he does not need support from others to do so.  With support he can take a few steps  Reflexes: Deep tendon reflexes are symmetric and normal bilaterally.       DIAGNOSTIC DATA (LABS, IMAGING, TESTING) - I reviewed patient records, labs, notes, testing and imaging myself where  available.  Lab Results  Component Value Date   WBC 4.4 11/24/2021   HGB 10.2 (L) 11/24/2021    HCT 31.5 (L) 11/24/2021   MCV 85.8 11/24/2021   PLT 183 11/24/2021      Component Value Date/Time   NA 142 11/24/2021 0509   K 3.2 (L) 11/26/2021 0813   CL 106 11/24/2021 0509   CO2 24 11/24/2021 0509   GLUCOSE 100 (H) 11/24/2021 0509   BUN 6 11/24/2021 0509   CREATININE 0.62 11/24/2021 0509   CREATININE 0.90 03/31/2017 1625   CALCIUM 8.7 (L) 11/24/2021 0509   PROT 5.5 (L) 11/24/2021 0509   ALBUMIN 2.7 (L) 11/24/2021 0509   ALBUMIN 1.9 (L) 09/22/2021 1348   AST 21 11/24/2021 0509   ALT 25 11/24/2021 0509   ALKPHOS 62 11/24/2021 0509   BILITOT 0.5 11/24/2021 0509   GFRNONAA >60 11/24/2021 0509   GFRAA >90 04/07/2011 0927   Lab Results  Component Value Date   CHOL 117 04/29/2021   HDL 32 (L) 04/29/2021   LDLCALC 66 04/29/2021   LDLDIRECT 101.0 01/21/2016   TRIG 93 04/29/2021   CHOLHDL 3.7 04/29/2021   Lab Results  Component Value Date   HGBA1C 5.1 10/27/2021   Lab Results  Component Value Date   VITAMINB12 1,427 (H) 09/22/2021   Lab Results  Component Value Date   TSH 1.057 11/07/2021       ASSESSMENT AND PLAN  Demyelinating disease ([place])  Acute disseminated encephalomyelitis  History of encephalopathy  History of multiple strokes  Gait disturbance  History of sepsis   In summary, Mr. Kibby is a 61 year old man with a complicated history with a long hospitalization for GI bleed, sepsis, multiple brain lesions with more recent improvement.  Additionally, he has a history of multiple strokes.  The appearance of the MRI in September is most consistent with acute disseminated encephalomyelitis.  It is possible that this was triggered by infection.  Anti-MOG antibody is negative.    CSF was normal.   I discussed with him and his family that ADEM is usually a monophasic disease.  Although it resembles MS it differs and not being associated with relapses.  Because the demyelination is superimposed on his history of strokes it is difficult to predict how well he  will improve.  Additionally, he is still recovering from his multiple GI bleeds and episodes of sepsis and other infection.  In early 2024, we will reorder an MRI at that time to determine if there has been further progression.  If so, we would need to reconsider MS, though with a normal CSF this is not likely. He will continue physical therapy.  He is currently using a walker and if he continues to improve will hopefully be able to get to a cane or even walk independently again.  He should stay active. He will return to see me in 2 to 3 months or sooner if there are new or worsening neurologic symptoms.  80-minute office visit with the majority of the time spent face-to-face for history and physical, discussion/counseling and decision-making.  Additional time with record review and documentation.   Kellyanne Ellwanger A. Felecia Shelling, MD, [facility] [id number], [id number] PM Certified in Neurology, Clinical Neurophysiology, Sleep Medicine and Neuroimaging  [facility] [address] [place], [place] [address] [phone]

## 2021-12-09 ENCOUNTER — Ambulatory Visit (INDEPENDENT_AMBULATORY_CARE_PROVIDER_SITE_OTHER): Payer: Medicare HMO | Admitting: Urology

## 2021-12-09 VITALS — BP 97/57 | HR 71

## 2021-12-09 DIAGNOSIS — R339 Retention of urine, unspecified: Secondary | ICD-10-CM

## 2021-12-09 DIAGNOSIS — N401 Enlarged prostate with lower urinary tract symptoms: Secondary | ICD-10-CM | POA: Diagnosis not present

## 2021-12-09 DIAGNOSIS — N138 Other obstructive and reflux uropathy: Secondary | ICD-10-CM | POA: Diagnosis not present

## 2021-12-09 NOTE — Progress Notes (Signed)
H&P  Chief Complaint: Urinary retention  History of Present Illness: 61 yo male presents for followup of extended hospitalization for mgmt of severe GI bleed complicated by encephalitis as well as acute emphysematous cholecystitis. He developed AUR and failed TOVs despite use of flomax and bethanechol.  Prior to his catastrophic event above, apparently there were no problems with urination, and he never saw a urologist.  Currently, he has an indwelling catheter.  This was last changed 2 to 3 weeks ago while on the rehab unit at [facility].  He was put on intermittent catheterization for a while-but now that he is home this is not a convenient thing for his wife who basically has total care of him.  He is due for catheter change next week.  He is still on tamsulosin.  Past Medical History:  Diagnosis Date   Anemia    Arthritis    CVA (cerebral vascular accident) ([place])    Diabetes mellitus without complication ([place])    Hyperlipidemia    Hypertension    Kidney stones     Past Surgical History:  Procedure Laterality Date   ANKLE SURGERY     BIOPSY  09/10/2021   Procedure: BIOPSY;  Surgeon: Carol Ada, MD;  Location: [facility] ENDOSCOPY;  Service: Gastroenterology;;   BIOPSY  10/07/2021   Procedure: BIOPSY;  Surgeon: Carol Ada, MD;  Location: [facility] ENDOSCOPY;  Service: Gastroenterology;;   COLONOSCOPY N/A 10/07/2021   Procedure: COLONOSCOPY;  Surgeon: Carol Ada, MD;  Location: [place];  Service: Gastroenterology;  Laterality: N/A;   COLONOSCOPY WITH PROPOFOL N/A 09/06/2021   Procedure: COLONOSCOPY WITH PROPOFOL;  Surgeon: Harvel Quale, MD;  [facility];  Service: Gastroenterology;  Laterality: N/A;   ENTEROSCOPY N/A 09/10/2021   Procedure: ENTEROSCOPY;  Surgeon: Carol Ada, MD;  Location: [place];  Service: Gastroenterology;  Laterality: N/A;   ENTEROSCOPY  09/06/2021   Procedure: ENTEROSCOPY;  Surgeon: Harvel Quale, MD;  [facility];  Service: Gastroenterology;;   ESOPHAGOGASTRODUODENOSCOPY (EGD) WITH PROPOFOL  09/06/2021   Procedure: ESOPHAGOGASTRODUODENOSCOPY (EGD) WITH PROPOFOL;  Surgeon: Harvel Quale, MD;  [facility];  Service: Gastroenterology;;   [place] N/A 09/19/2021   Procedure: Beryle Quant;  Surgeon: Carol Ada, MD;  Location: [place];  Service: Gastroenterology;  Laterality: N/A;   FOREIGN BODY REMOVAL  09/19/2021   Procedure: FOREIGN BODY REMOVAL;  Surgeon: Carol Ada, MD;  Location: [facility] ENDOSCOPY;  Service: Gastroenterology;;   Freda Munro CAPSULE STUDY  09/06/2021   Procedure: GIVENS CAPSULE STUDY;  Surgeon: Harvel Quale, MD;  [facility];  Service: Gastroenterology;;   HERNIA REPAIR     umbilical x1 Incisional x1   INCISIONAL HERNIA REPAIR  04/13/2011   Procedure: LAPAROSCOPIC INCISIONAL HERNIA;  Surgeon: Jamesetta So, MD;  Location: [facility];  Service: General;  Laterality: N/A;  Recurrent Laparoscopic Incisional Herniorraphy with Mesh   IR ANGIOGRAM SELECTIVE EACH ADDITIONAL VESSEL  09/09/2021   IR ANGIOGRAM VISCERAL SELECTIVE  09/07/2021   IR ANGIOGRAM VISCERAL SELECTIVE  09/06/2021   IR ANGIOGRAM VISCERAL SELECTIVE  09/06/2021   IR CHOLANGIOGRAM EXISTING TUBE  11/06/2021   IR EMBO ART  VEN HEMORR LYMPH EXTRAV  INC GUIDE ROADMAPPING  09/06/2021   IR EXCHANGE BILIARY DRAIN  10/24/2021   IR EXCHANGE BILIARY DRAIN  11/19/2021   IR GUIDED DRAIN W CATHETER PLACEMENT  09/07/2021   IR US GUIDE BX ASP/DRAIN  09/07/2021   IR US GUIDE VASC ACCESS RIGHT  09/07/2021   IR US GUIDE VASC ACCESS RIGHT  09/06/2021   KIDNEY STONE SURGERY      Home Medications:  Allergies as of 12/09/2021       Reactions   Metformin And Related    GI upset        Medication List        Accurate as of December 09, 2021  6:16 AM. If you have any questions, ask your nurse or doctor.          acetaminophen 325 MG tablet Commonly known as: TYLENOL Take  1-2 tablets (325-650 mg total) by mouth every 4 (four) hours as needed for mild pain.   BD Veo Insulin Syringe U/F 31G X 15/64" 1 ML Misc Generic drug: Insulin Syringe-Needle U-100 USE AS DIRECTED   glucose blood test strip Commonly known as: OneTouch Verio TEST twice a day   Insulin Pen Needle 32G X 5 MM Misc Commonly known as: NovoTwist Use two daily to inject Victoza and Toujeo.   methocarbamol 500 MG tablet Commonly known as: ROBAXIN Take 1 tablet (500 mg total) by mouth every 6 (six) hours as needed for muscle spasms.   metoprolol tartrate 25 MG tablet Commonly known as: LOPRESSOR Take 1 tablet (25 mg total) by mouth 2 (two) times daily.   OneTouch Delica Lancets 33G Misc Use to check blood sugar once a day dx code E11.65   pantoprazole 40 MG tablet Commonly known as: PROTONIX Take 1 tablet (40 mg total) by mouth daily.   rosuvastatin 40 MG tablet Commonly known as: Crestor Take 1 tablet (40 mg total) by mouth daily.   tamsulosin 0.4 MG Caps capsule Commonly known as: FLOMAX Take 1 capsule (0.4 mg total) by mouth daily after supper.   traZODone 50 MG tablet Commonly known as: DESYREL Take 0.5-1 tablets (25-50 mg total) by mouth at bedtime as needed for sleep.   [name] FlexTouch 200 UNIT/ML FlexTouch Pen Generic drug: insulin degludec Inject under skin 15 units twice a day.        Allergies:  Allergies  Allergen Reactions   Metformin And Related     GI upset    Family History  Problem Relation Age of Onset   Heart failure Mother    Diabetes Father    Cancer Other    Heart attack Other    Anesthesia problems Neg Hx    Hypotension Neg Hx    Malignant hyperthermia Neg Hx    Pseudochol deficiency Neg Hx    Colon cancer Neg Hx    Inflammatory bowel disease Neg Hx     Social History:  reports that he has never smoked. His smokeless tobacco use includes snuff. He reports that he does not drink alcohol and does not use drugs.  ROS: A complete  review of systems was performed.  All systems are negative except for pertinent findings as noted.  Physical Exam:  Vital signs in last 24 hours: There were no vitals taken for this visit. Constitutional:  Alert and oriented, No acute distress Cardiovascular: Regular rate  Respiratory: Normal respiratory effort Neurologic: Grossly intact, no focal deficits Psychiatric: Normal mood and affect    I have reviewed notes from referring/previous physicians-Hospital discharge  I have reviewed urinalysis results  I have independently reviewed prior imaging     Impression/Assessment:  1.  Urinary retention.  Most likely due to his significant neurologic insult.  He has an indwelling catheter that has been working well  2.  History of  recent antibiotic administration for "UTI" although patient not symptomatic  Plan:  1.  I strongly recommend that he not be treated for urinary tract infections unless his bladder becomes symptomatic i.e. blood or pain  2.  I recommended that they stop the tamsulosin as it is not helping with an indwelling catheter  3.  At this point, his wife would like to continue with the indwelling catheter.  I have written an order for catheter changes monthly by home health care with a 16 French Foley catheter  4.  Increase fluid intake on a daily basis  5.  I recommend probiotics since he has been on his Cipro recently  6.  I also recommended vitamin C 500 mg twice daily  7.  I will have him come back in 3 months for recheck

## 2021-12-10 ENCOUNTER — Encounter: Payer: Self-pay | Admitting: Neurology

## 2021-12-10 ENCOUNTER — Ambulatory Visit: Payer: Medicare HMO | Admitting: Neurology

## 2021-12-10 VITALS — BP 107/65 | HR 68 | Ht 72.0 in

## 2021-12-10 DIAGNOSIS — G379 Demyelinating disease of central nervous system, unspecified: Secondary | ICD-10-CM | POA: Diagnosis not present

## 2021-12-10 DIAGNOSIS — R269 Unspecified abnormalities of gait and mobility: Secondary | ICD-10-CM | POA: Insufficient documentation

## 2021-12-10 DIAGNOSIS — Z8673 Personal history of transient ischemic attack (TIA), and cerebral infarction without residual deficits: Secondary | ICD-10-CM

## 2021-12-10 DIAGNOSIS — G0400 Acute disseminated encephalitis and encephalomyelitis, unspecified: Secondary | ICD-10-CM

## 2021-12-10 DIAGNOSIS — Z8619 Personal history of other infectious and parasitic diseases: Secondary | ICD-10-CM

## 2021-12-10 DIAGNOSIS — Z8669 Personal history of other diseases of the nervous system and sense organs: Secondary | ICD-10-CM

## 2021-12-15 DIAGNOSIS — K811 Chronic cholecystitis: Secondary | ICD-10-CM | POA: Diagnosis not present

## 2021-12-22 DIAGNOSIS — N178 Other acute kidney failure: Secondary | ICD-10-CM | POA: Diagnosis not present

## 2021-12-22 DIAGNOSIS — E861 Hypovolemia: Secondary | ICD-10-CM | POA: Diagnosis not present

## 2021-12-22 DIAGNOSIS — E876 Hypokalemia: Secondary | ICD-10-CM | POA: Diagnosis not present

## 2021-12-22 DIAGNOSIS — G0400 Acute disseminated encephalitis and encephalomyelitis, unspecified: Secondary | ICD-10-CM | POA: Diagnosis not present

## 2021-12-22 DIAGNOSIS — Z960 Presence of urogenital implants: Secondary | ICD-10-CM | POA: Diagnosis not present

## 2021-12-22 DIAGNOSIS — E785 Hyperlipidemia, unspecified: Secondary | ICD-10-CM | POA: Diagnosis not present

## 2021-12-22 DIAGNOSIS — K922 Gastrointestinal hemorrhage, unspecified: Secondary | ICD-10-CM | POA: Diagnosis not present

## 2021-12-22 DIAGNOSIS — K529 Noninfective gastroenteritis and colitis, unspecified: Secondary | ICD-10-CM | POA: Diagnosis not present

## 2021-12-22 DIAGNOSIS — E1165 Type 2 diabetes mellitus with hyperglycemia: Secondary | ICD-10-CM | POA: Diagnosis not present

## 2021-12-22 DIAGNOSIS — K819 Cholecystitis, unspecified: Secondary | ICD-10-CM | POA: Diagnosis not present

## 2021-12-22 DIAGNOSIS — I471 Supraventricular tachycardia, unspecified: Secondary | ICD-10-CM | POA: Diagnosis not present

## 2021-12-22 DIAGNOSIS — D649 Anemia, unspecified: Secondary | ICD-10-CM | POA: Diagnosis not present

## 2021-12-24 DIAGNOSIS — M5440 Lumbago with sciatica, unspecified side: Secondary | ICD-10-CM | POA: Diagnosis not present

## 2021-12-24 DIAGNOSIS — J9611 Chronic respiratory failure with hypoxia: Secondary | ICD-10-CM | POA: Diagnosis not present

## 2021-12-24 DIAGNOSIS — G0400 Acute disseminated encephalitis and encephalomyelitis, unspecified: Secondary | ICD-10-CM | POA: Diagnosis not present

## 2021-12-24 DIAGNOSIS — R5381 Other malaise: Secondary | ICD-10-CM | POA: Diagnosis not present

## 2021-12-24 DIAGNOSIS — G8929 Other chronic pain: Secondary | ICD-10-CM | POA: Diagnosis not present

## 2021-12-25 DIAGNOSIS — M5440 Lumbago with sciatica, unspecified side: Secondary | ICD-10-CM | POA: Diagnosis not present

## 2021-12-25 DIAGNOSIS — G8929 Other chronic pain: Secondary | ICD-10-CM | POA: Diagnosis not present

## 2021-12-25 DIAGNOSIS — R5381 Other malaise: Secondary | ICD-10-CM | POA: Diagnosis not present

## 2021-12-25 DIAGNOSIS — G0400 Acute disseminated encephalitis and encephalomyelitis, unspecified: Secondary | ICD-10-CM | POA: Diagnosis not present

## 2021-12-25 DIAGNOSIS — J9611 Chronic respiratory failure with hypoxia: Secondary | ICD-10-CM | POA: Diagnosis not present

## 2021-12-26 DIAGNOSIS — R5381 Other malaise: Secondary | ICD-10-CM | POA: Diagnosis not present

## 2021-12-26 DIAGNOSIS — G0400 Acute disseminated encephalitis and encephalomyelitis, unspecified: Secondary | ICD-10-CM | POA: Diagnosis not present

## 2021-12-26 DIAGNOSIS — G8929 Other chronic pain: Secondary | ICD-10-CM | POA: Diagnosis not present

## 2021-12-26 DIAGNOSIS — M5440 Lumbago with sciatica, unspecified side: Secondary | ICD-10-CM | POA: Diagnosis not present

## 2021-12-26 DIAGNOSIS — J9611 Chronic respiratory failure with hypoxia: Secondary | ICD-10-CM | POA: Diagnosis not present

## 2021-12-30 DIAGNOSIS — Z87440 Personal history of urinary (tract) infections: Secondary | ICD-10-CM | POA: Diagnosis not present

## 2021-12-30 DIAGNOSIS — K529 Noninfective gastroenteritis and colitis, unspecified: Secondary | ICD-10-CM | POA: Diagnosis not present

## 2021-12-30 DIAGNOSIS — E669 Obesity, unspecified: Secondary | ICD-10-CM | POA: Diagnosis not present

## 2021-12-30 DIAGNOSIS — R69 Illness, unspecified: Secondary | ICD-10-CM | POA: Diagnosis not present

## 2021-12-30 DIAGNOSIS — Z794 Long term (current) use of insulin: Secondary | ICD-10-CM | POA: Diagnosis not present

## 2021-12-30 DIAGNOSIS — R339 Retention of urine, unspecified: Secondary | ICD-10-CM | POA: Diagnosis not present

## 2021-12-30 DIAGNOSIS — E119 Type 2 diabetes mellitus without complications: Secondary | ICD-10-CM | POA: Diagnosis not present

## 2021-12-30 DIAGNOSIS — G35 Multiple sclerosis: Secondary | ICD-10-CM | POA: Diagnosis not present

## 2021-12-30 DIAGNOSIS — Z9181 History of falling: Secondary | ICD-10-CM | POA: Diagnosis not present

## 2021-12-30 DIAGNOSIS — J9611 Chronic respiratory failure with hypoxia: Secondary | ICD-10-CM | POA: Diagnosis not present

## 2021-12-30 DIAGNOSIS — I10 Essential (primary) hypertension: Secondary | ICD-10-CM | POA: Diagnosis not present

## 2021-12-30 DIAGNOSIS — Z8673 Personal history of transient ischemic attack (TIA), and cerebral infarction without residual deficits: Secondary | ICD-10-CM | POA: Diagnosis not present

## 2021-12-30 DIAGNOSIS — M25512 Pain in left shoulder: Secondary | ICD-10-CM | POA: Diagnosis not present

## 2021-12-30 DIAGNOSIS — D649 Anemia, unspecified: Secondary | ICD-10-CM | POA: Diagnosis not present

## 2021-12-30 DIAGNOSIS — Z6834 Body mass index (BMI) 34.0-34.9, adult: Secondary | ICD-10-CM | POA: Diagnosis not present

## 2021-12-30 DIAGNOSIS — E785 Hyperlipidemia, unspecified: Secondary | ICD-10-CM | POA: Diagnosis not present

## 2021-12-30 DIAGNOSIS — M25511 Pain in right shoulder: Secondary | ICD-10-CM | POA: Diagnosis not present

## 2021-12-30 DIAGNOSIS — G0400 Acute disseminated encephalitis and encephalomyelitis, unspecified: Secondary | ICD-10-CM | POA: Diagnosis not present

## 2021-12-30 DIAGNOSIS — M1990 Unspecified osteoarthritis, unspecified site: Secondary | ICD-10-CM | POA: Diagnosis not present

## 2021-12-31 DIAGNOSIS — Z6834 Body mass index (BMI) 34.0-34.9, adult: Secondary | ICD-10-CM | POA: Diagnosis not present

## 2021-12-31 DIAGNOSIS — J9611 Chronic respiratory failure with hypoxia: Secondary | ICD-10-CM | POA: Diagnosis not present

## 2021-12-31 DIAGNOSIS — E669 Obesity, unspecified: Secondary | ICD-10-CM | POA: Diagnosis not present

## 2021-12-31 DIAGNOSIS — M25511 Pain in right shoulder: Secondary | ICD-10-CM | POA: Diagnosis not present

## 2021-12-31 DIAGNOSIS — R339 Retention of urine, unspecified: Secondary | ICD-10-CM | POA: Diagnosis not present

## 2021-12-31 DIAGNOSIS — E785 Hyperlipidemia, unspecified: Secondary | ICD-10-CM | POA: Diagnosis not present

## 2021-12-31 DIAGNOSIS — M1990 Unspecified osteoarthritis, unspecified site: Secondary | ICD-10-CM | POA: Diagnosis not present

## 2021-12-31 DIAGNOSIS — Z9181 History of falling: Secondary | ICD-10-CM | POA: Diagnosis not present

## 2021-12-31 DIAGNOSIS — Z8673 Personal history of transient ischemic attack (TIA), and cerebral infarction without residual deficits: Secondary | ICD-10-CM | POA: Diagnosis not present

## 2021-12-31 DIAGNOSIS — E119 Type 2 diabetes mellitus without complications: Secondary | ICD-10-CM | POA: Diagnosis not present

## 2021-12-31 DIAGNOSIS — R69 Illness, unspecified: Secondary | ICD-10-CM | POA: Diagnosis not present

## 2021-12-31 DIAGNOSIS — G35 Multiple sclerosis: Secondary | ICD-10-CM | POA: Diagnosis not present

## 2021-12-31 DIAGNOSIS — Z794 Long term (current) use of insulin: Secondary | ICD-10-CM | POA: Diagnosis not present

## 2021-12-31 DIAGNOSIS — D649 Anemia, unspecified: Secondary | ICD-10-CM | POA: Diagnosis not present

## 2021-12-31 DIAGNOSIS — M25512 Pain in left shoulder: Secondary | ICD-10-CM | POA: Diagnosis not present

## 2021-12-31 DIAGNOSIS — I10 Essential (primary) hypertension: Secondary | ICD-10-CM | POA: Diagnosis not present

## 2021-12-31 DIAGNOSIS — K529 Noninfective gastroenteritis and colitis, unspecified: Secondary | ICD-10-CM | POA: Diagnosis not present

## 2021-12-31 DIAGNOSIS — G0400 Acute disseminated encephalitis and encephalomyelitis, unspecified: Secondary | ICD-10-CM | POA: Diagnosis not present

## 2021-12-31 DIAGNOSIS — Z87440 Personal history of urinary (tract) infections: Secondary | ICD-10-CM | POA: Diagnosis not present

## 2022-01-01 DIAGNOSIS — N390 Urinary tract infection, site not specified: Secondary | ICD-10-CM | POA: Diagnosis not present

## 2022-01-01 DIAGNOSIS — R800 Isolated proteinuria: Secondary | ICD-10-CM | POA: Diagnosis not present

## 2022-01-01 DIAGNOSIS — R809 Proteinuria, unspecified: Secondary | ICD-10-CM | POA: Diagnosis not present

## 2022-01-01 DIAGNOSIS — Z960 Presence of urogenital implants: Secondary | ICD-10-CM | POA: Diagnosis not present

## 2022-01-06 DIAGNOSIS — R339 Retention of urine, unspecified: Secondary | ICD-10-CM | POA: Diagnosis not present

## 2022-01-06 DIAGNOSIS — Z9181 History of falling: Secondary | ICD-10-CM | POA: Diagnosis not present

## 2022-01-06 DIAGNOSIS — J9611 Chronic respiratory failure with hypoxia: Secondary | ICD-10-CM | POA: Diagnosis not present

## 2022-01-06 DIAGNOSIS — M1990 Unspecified osteoarthritis, unspecified site: Secondary | ICD-10-CM | POA: Diagnosis not present

## 2022-01-06 DIAGNOSIS — G0400 Acute disseminated encephalitis and encephalomyelitis, unspecified: Secondary | ICD-10-CM | POA: Diagnosis not present

## 2022-01-06 DIAGNOSIS — E119 Type 2 diabetes mellitus without complications: Secondary | ICD-10-CM | POA: Diagnosis not present

## 2022-01-06 DIAGNOSIS — E669 Obesity, unspecified: Secondary | ICD-10-CM | POA: Diagnosis not present

## 2022-01-06 DIAGNOSIS — Z87440 Personal history of urinary (tract) infections: Secondary | ICD-10-CM | POA: Diagnosis not present

## 2022-01-06 DIAGNOSIS — M25512 Pain in left shoulder: Secondary | ICD-10-CM | POA: Diagnosis not present

## 2022-01-06 DIAGNOSIS — R69 Illness, unspecified: Secondary | ICD-10-CM | POA: Diagnosis not present

## 2022-01-06 DIAGNOSIS — Z794 Long term (current) use of insulin: Secondary | ICD-10-CM | POA: Diagnosis not present

## 2022-01-06 DIAGNOSIS — Z6834 Body mass index (BMI) 34.0-34.9, adult: Secondary | ICD-10-CM | POA: Diagnosis not present

## 2022-01-06 DIAGNOSIS — K529 Noninfective gastroenteritis and colitis, unspecified: Secondary | ICD-10-CM | POA: Diagnosis not present

## 2022-01-06 DIAGNOSIS — M25511 Pain in right shoulder: Secondary | ICD-10-CM | POA: Diagnosis not present

## 2022-01-06 DIAGNOSIS — E785 Hyperlipidemia, unspecified: Secondary | ICD-10-CM | POA: Diagnosis not present

## 2022-01-06 DIAGNOSIS — D649 Anemia, unspecified: Secondary | ICD-10-CM | POA: Diagnosis not present

## 2022-01-06 DIAGNOSIS — G35 Multiple sclerosis: Secondary | ICD-10-CM | POA: Diagnosis not present

## 2022-01-06 DIAGNOSIS — I10 Essential (primary) hypertension: Secondary | ICD-10-CM | POA: Diagnosis not present

## 2022-01-06 DIAGNOSIS — Z8673 Personal history of transient ischemic attack (TIA), and cerebral infarction without residual deficits: Secondary | ICD-10-CM | POA: Diagnosis not present

## 2022-01-07 DIAGNOSIS — M1990 Unspecified osteoarthritis, unspecified site: Secondary | ICD-10-CM | POA: Diagnosis not present

## 2022-01-07 DIAGNOSIS — Z87440 Personal history of urinary (tract) infections: Secondary | ICD-10-CM | POA: Diagnosis not present

## 2022-01-07 DIAGNOSIS — Z6834 Body mass index (BMI) 34.0-34.9, adult: Secondary | ICD-10-CM | POA: Diagnosis not present

## 2022-01-07 DIAGNOSIS — Z794 Long term (current) use of insulin: Secondary | ICD-10-CM | POA: Diagnosis not present

## 2022-01-07 DIAGNOSIS — Z8673 Personal history of transient ischemic attack (TIA), and cerebral infarction without residual deficits: Secondary | ICD-10-CM | POA: Diagnosis not present

## 2022-01-07 DIAGNOSIS — G35 Multiple sclerosis: Secondary | ICD-10-CM | POA: Diagnosis not present

## 2022-01-07 DIAGNOSIS — G0400 Acute disseminated encephalitis and encephalomyelitis, unspecified: Secondary | ICD-10-CM | POA: Diagnosis not present

## 2022-01-07 DIAGNOSIS — M25512 Pain in left shoulder: Secondary | ICD-10-CM | POA: Diagnosis not present

## 2022-01-07 DIAGNOSIS — I10 Essential (primary) hypertension: Secondary | ICD-10-CM | POA: Diagnosis not present

## 2022-01-07 DIAGNOSIS — R339 Retention of urine, unspecified: Secondary | ICD-10-CM | POA: Diagnosis not present

## 2022-01-07 DIAGNOSIS — J9611 Chronic respiratory failure with hypoxia: Secondary | ICD-10-CM | POA: Diagnosis not present

## 2022-01-07 DIAGNOSIS — K529 Noninfective gastroenteritis and colitis, unspecified: Secondary | ICD-10-CM | POA: Diagnosis not present

## 2022-01-07 DIAGNOSIS — D649 Anemia, unspecified: Secondary | ICD-10-CM | POA: Diagnosis not present

## 2022-01-07 DIAGNOSIS — Z9181 History of falling: Secondary | ICD-10-CM | POA: Diagnosis not present

## 2022-01-07 DIAGNOSIS — E119 Type 2 diabetes mellitus without complications: Secondary | ICD-10-CM | POA: Diagnosis not present

## 2022-01-07 DIAGNOSIS — E669 Obesity, unspecified: Secondary | ICD-10-CM | POA: Diagnosis not present

## 2022-01-07 DIAGNOSIS — R69 Illness, unspecified: Secondary | ICD-10-CM | POA: Diagnosis not present

## 2022-01-07 DIAGNOSIS — E785 Hyperlipidemia, unspecified: Secondary | ICD-10-CM | POA: Diagnosis not present

## 2022-01-07 DIAGNOSIS — M25511 Pain in right shoulder: Secondary | ICD-10-CM | POA: Diagnosis not present

## 2022-01-08 ENCOUNTER — Encounter: Payer: Medicare HMO | Attending: Physical Medicine & Rehabilitation | Admitting: Physical Medicine & Rehabilitation

## 2022-01-08 ENCOUNTER — Encounter: Payer: Self-pay | Admitting: Physical Medicine & Rehabilitation

## 2022-01-08 VITALS — BP 102/73 | HR 88 | Ht 72.0 in

## 2022-01-08 DIAGNOSIS — G969 Disorder of central nervous system, unspecified: Secondary | ICD-10-CM | POA: Diagnosis not present

## 2022-01-08 NOTE — Progress Notes (Signed)
Subjective:    Patient ID: Troy Randall, male    DOB: 23-Jun-1960, 62 y.o.   MRN: [id number] 62 y.o. male who presented to [facility] emergency department on 09/04/2021 reporting hematochezia with medical history of prior stroke maintained on Plavix and aspirin.  He ultimately required 20 units of packed red blood cells and underwent interventional radiology consultation with coil of the jejunal branch of the SMA on 9/2.  He developed acute encephalopathy and CT head was performed.  Neurology was consulted and he underwent lumbar puncture and MRI of the brain performed secondary to multiple interval white matter insults versus inflammatory process.  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  He was ultimately admitted to the inpatient rehab unit on 10/26.  On 11/2 he developed rigors and rectal temp of 100.  Sepsis work-up initiated.  His vital signs were rechecked and his heart rate was in the 160s and rapid response was called.  He was transferred to acute care/[name] service to be monitored for SVT and sepsis work-up.  His blood cultures were positive for E. coli and he was treated with IV cephalosporins.  He was readmitted to the inpatient rehab department on 11/8 within the dwelling Foley catheter and on oral Augmentin.  Cholecystostomy tube remains in place.  Admit date: 11/12/2021 Discharge date: 11/26/2021 HPI 62 year old male admitted to [facility] inpatient rehab for the diagnosis of encephalopathy and debility following sepsis related to cholecystitis.  The patient had cholecystostomy drain placed.  He has had problems with urinary retention and recurrent urinary tract infections.  He has followed up with urology and no voiding trial was advised at that time. The patient has also followed up with neurology for CNS demyelination.  A follow-up appointment is scheduled in March with  probable imaging study at that time. The family has been providing 24/7 care for the patient he is still requiring physical assistance for dressing bathing grooming hygiene.  The patient is able to go from sit to stand with supervision but is unable to walk independently. He has been receiving home health therapy twice a week.  Family was wondering if he could be readmitted to inpatient rehabilitation.  He has no upcoming surgeries and will have reevaluation in March.  Chole cystostomy changed every 8 wks  Pain Inventory Average Pain 5 Pain Right Now 5 My pain is tingling, aching, and .  LOCATION OF PAIN  shoulder,back,buttocks,groin  BOWEL Number of stools per week: 7   BLADDER Foley    Mobility walk with assistance use a walker how many minutes can you walk? few ability to climb steps?  no do you drive?  no use a wheelchair needs help with transfers Do you have any goals in this area?  yes  Function disabled: date disabled 7years I need assistance with the following:  feeding, dressing, bathing, toileting, meal prep, household duties, and shopping Do you have any goals in this area?  yes  Neuro/Psych bladder control problems bowel control problems weakness numbness tingling trouble walking dizziness confusion depression anxiety loss of taste or smell  Prior Studies Any changes since last visit?  no  Physicians involved in your care Any changes since last visit?  no   Family History  Problem Relation Age of Onset   Heart failure Mother    Diabetes Father    Cancer Other    Heart attack Other    Anesthesia problems  Neg Hx    Hypotension Neg Hx    Malignant hyperthermia Neg Hx    Pseudochol deficiency Neg Hx    Colon cancer Neg Hx    Inflammatory bowel disease Neg Hx    Social History   Socioeconomic History   Marital status: Married    Spouse name: Not on file   Number of children: Not on file   Years of education: Not on file   Highest  education level: Not on file  Occupational History   Not on file  Tobacco Use   Smoking status: Never   Smokeless tobacco: Current    Types: Snuff  Vaping Use   Vaping Use: Never used  Substance and Sexual Activity   Alcohol use: No   Drug use: No   Sexual activity: Yes    Birth control/protection: None  Other Topics Concern   Not on file  Social History Narrative   Live in [place], [place].   Married for over 30 years.   Has 2 children, age 59/30s. Has one grandchild, 49 months.    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    Eats all food groups.   Wears seatbelt.    Dip 1 can a day.    Social Determinants of Health   Financial Resource Strain: Not on file  Food Insecurity: No Food Insecurity (09/29/2021)   Hunger Vital Sign    Worried About Running Out of Food in the Last Year: Never true    Ran Out of Food in the Last Year: Never true  Transportation Needs: No Transportation Needs (09/29/2021)   PRAPARE - Hydrologist (Medical): No    Lack of Transportation (Non-Medical): No  Physical Activity: Not on file  Stress: Not on file  Social Connections: Not on file   Past Surgical History:  Procedure Laterality Date   ANKLE SURGERY     BIOPSY  09/10/2021   Procedure: BIOPSY;  Surgeon: Carol Ada, MD;  Location: [facility] ENDOSCOPY;  Service: Gastroenterology;;   BIOPSY  10/07/2021   Procedure: BIOPSY;  Surgeon: Carol Ada, MD;  Location: [facility] ENDOSCOPY;  Service: Gastroenterology;;   COLONOSCOPY N/A 10/07/2021   Procedure: COLONOSCOPY;  Surgeon: Carol Ada, MD;  Location: [place];  Service: Gastroenterology;  Laterality: N/A;   COLONOSCOPY WITH PROPOFOL N/A 09/06/2021   Procedure: COLONOSCOPY WITH PROPOFOL;  Surgeon: Harvel Quale, MD;  Location: [facility] ENDO SUITE;  Service: Gastroenterology;  Laterality: N/A;   ENTEROSCOPY N/A 09/10/2021   Procedure: ENTEROSCOPY;  Surgeon: Carol Ada, MD;  Location: [place];  Service: Gastroenterology;  Laterality: N/A;   ENTEROSCOPY  09/06/2021   Procedure: ENTEROSCOPY;  Surgeon: Harvel Quale, MD;  Location: [facility] ENDO SUITE;  Service: Gastroenterology;;   ESOPHAGOGASTRODUODENOSCOPY (EGD) WITH PROPOFOL  09/06/2021   Procedure: ESOPHAGOGASTRODUODENOSCOPY (EGD) WITH PROPOFOL;  Surgeon: Harvel Quale, MD;  Location: [facility];  Service: Gastroenterology;;   [place] N/A 09/19/2021   Procedure: Beryle Quant;  Surgeon: Carol Ada, MD;  Location: [place];  Service: Gastroenterology;  Laterality: N/A;   FOREIGN BODY REMOVAL  09/19/2021   Procedure: FOREIGN BODY REMOVAL;  Surgeon: Carol Ada, MD;  Location: [place];  Service: Gastroenterology;;   Freda Munro CAPSULE STUDY  09/06/2021   Procedure: GIVENS CAPSULE STUDY;  Surgeon: Harvel Quale, MD;  Location: [facility];  Service: Gastroenterology;;   HERNIA REPAIR     umbilical x1 Incisional x1   INCISIONAL  HERNIA REPAIR  04/13/2011   Procedure: LAPAROSCOPIC INCISIONAL HERNIA;  Surgeon: Dalia Heading, MD;  Location: [facility];  Service: General;  Laterality: N/A;  Recurrent Laparoscopic Incisional Herniorraphy with Mesh   IR ANGIOGRAM SELECTIVE EACH ADDITIONAL VESSEL  09/09/2021   IR ANGIOGRAM VISCERAL SELECTIVE  09/07/2021   IR ANGIOGRAM VISCERAL SELECTIVE  09/06/2021   IR ANGIOGRAM VISCERAL SELECTIVE  09/06/2021   IR CHOLANGIOGRAM EXISTING TUBE  11/06/2021   IR EMBO ART  VEN HEMORR LYMPH EXTRAV  INC GUIDE ROADMAPPING  09/06/2021   IR EXCHANGE BILIARY DRAIN  10/24/2021   IR EXCHANGE BILIARY DRAIN  11/19/2021   IR GUIDED DRAIN W CATHETER PLACEMENT  09/07/2021   IR US GUIDE BX ASP/DRAIN  09/07/2021   IR US GUIDE VASC ACCESS RIGHT  09/07/2021   IR US GUIDE VASC ACCESS RIGHT  09/06/2021   KIDNEY STONE SURGERY     Past Medical History:  Diagnosis Date   Anemia    Arthritis    CVA (cerebral vascular accident) (HCC)    Diabetes mellitus without complication  (HCC)    Hyperlipidemia    Hypertension    Kidney stones    BP 102/73   Pulse 88   Ht 6' (1.829 m)   SpO2 94%   BMI 35.22 kg/m   Opioid Risk Score:   Fall Risk Score:  `1  Depression screen PHQ 2/9     01/08/2022    1:00 PM 01/08/2022   12:53 PM 08/27/2017    8:58 AM 07/16/2017    8:49 AM 06/15/2017    9:02 AM 05/12/2017    9:29 AM 03/31/2017    3:13 PM  Depression screen PHQ 2/9  Decreased Interest 2 0 0 2 2 1  0  Down, Depressed, Hopeless 3 0 0 1 2 1  0  PHQ - 2 Score 5 0 0 3 4 2  0  Altered sleeping 2  3 3 3 3    Tired, decreased energy 3  2 3 1 3    Change in appetite 2  0 0 1 0   Feeling bad or failure about yourself  2  0 1 3 1    Trouble concentrating 3  3 1 2 1    Moving slowly or fidgety/restless 2  0 1 2 1    Suicidal thoughts 0  0 0 0 0   PHQ-9 Score 19  8 12 16 11    Difficult doing work/chores Very difficult   Not difficult at all Somewhat difficult Somewhat difficult      Review of Systems  Musculoskeletal:  Positive for back pain.       Shoulder pain Buttocks pain Groin pain   All other systems reviewed and are negative.     Objective:   Physical Exam Patient oriented to person generally to place but not time.  The patient does not remember author from inpatient rehab stay despite multiple visits. Speech is without dysarthria or aphasia. Motor strength is 4/5 bilateral deltoid bicep tricep grip hip flexion extension ankle dorsiflexion Left deltoid does have limited active range of motion to around 90 degrees does not have pain with further movement.  Negative impingement sign Cervical spine range of motion is 50% of normal with flexion extension lateral bending and rotation negative foraminal compression test Sensation normal bilateral upper limbs and lower limbs. Tone no evidence of spasticity in the upper or lower expected extremities. Sit to stand is supervision assistance.       Assessment & Plan:  1.  History of  encephalopathy after sepsis however  patient has prolonged recovery and appears to be at same functional level as he was supposed discharged from rehab in November.  Question whether patient has had progressive white matter CNS lesions.  Patient has an appointment to follow-up with neurology in March and assessment for additional imaging studies.  Will message Dr. Rachel Moulds regarding current functional and cognitive status. 2.  Urinary retention with recurrent UTI ideally he would be a candidate for suprapubic catheter however he has abdominal mesh from prior ventral hernia repair.  He will follow-up with urology. As discussed with patient other PT OT options would be outpatient therapy however they do not have transportation given that it takes to person assist to get him in and out of car.  Wife is needing to go back to work from a Company secretary.  They plan to follow-up with primary care to see about SNF options.  We discussed that in-hospital rehabilitation admission would have to follow an acute care admission and an acute decline in function. Physical medicine rehab follow-up on as-needed basis

## 2022-01-08 NOTE — Patient Instructions (Addendum)
Suprapubic catheter may not be possible with a mesh

## 2022-01-09 DIAGNOSIS — E119 Type 2 diabetes mellitus without complications: Secondary | ICD-10-CM | POA: Diagnosis not present

## 2022-01-09 DIAGNOSIS — E785 Hyperlipidemia, unspecified: Secondary | ICD-10-CM | POA: Diagnosis not present

## 2022-01-09 DIAGNOSIS — Z6834 Body mass index (BMI) 34.0-34.9, adult: Secondary | ICD-10-CM | POA: Diagnosis not present

## 2022-01-09 DIAGNOSIS — J9611 Chronic respiratory failure with hypoxia: Secondary | ICD-10-CM | POA: Diagnosis not present

## 2022-01-09 DIAGNOSIS — D649 Anemia, unspecified: Secondary | ICD-10-CM | POA: Diagnosis not present

## 2022-01-09 DIAGNOSIS — Z9181 History of falling: Secondary | ICD-10-CM | POA: Diagnosis not present

## 2022-01-09 DIAGNOSIS — M25511 Pain in right shoulder: Secondary | ICD-10-CM | POA: Diagnosis not present

## 2022-01-09 DIAGNOSIS — M1990 Unspecified osteoarthritis, unspecified site: Secondary | ICD-10-CM | POA: Diagnosis not present

## 2022-01-09 DIAGNOSIS — K529 Noninfective gastroenteritis and colitis, unspecified: Secondary | ICD-10-CM | POA: Diagnosis not present

## 2022-01-09 DIAGNOSIS — M25512 Pain in left shoulder: Secondary | ICD-10-CM | POA: Diagnosis not present

## 2022-01-09 DIAGNOSIS — G35 Multiple sclerosis: Secondary | ICD-10-CM | POA: Diagnosis not present

## 2022-01-09 DIAGNOSIS — Z87440 Personal history of urinary (tract) infections: Secondary | ICD-10-CM | POA: Diagnosis not present

## 2022-01-09 DIAGNOSIS — R69 Illness, unspecified: Secondary | ICD-10-CM | POA: Diagnosis not present

## 2022-01-09 DIAGNOSIS — R339 Retention of urine, unspecified: Secondary | ICD-10-CM | POA: Diagnosis not present

## 2022-01-09 DIAGNOSIS — E669 Obesity, unspecified: Secondary | ICD-10-CM | POA: Diagnosis not present

## 2022-01-09 DIAGNOSIS — G0400 Acute disseminated encephalitis and encephalomyelitis, unspecified: Secondary | ICD-10-CM | POA: Diagnosis not present

## 2022-01-09 DIAGNOSIS — Z794 Long term (current) use of insulin: Secondary | ICD-10-CM | POA: Diagnosis not present

## 2022-01-09 DIAGNOSIS — I10 Essential (primary) hypertension: Secondary | ICD-10-CM | POA: Diagnosis not present

## 2022-01-09 DIAGNOSIS — Z8673 Personal history of transient ischemic attack (TIA), and cerebral infarction without residual deficits: Secondary | ICD-10-CM | POA: Diagnosis not present

## 2022-01-12 DIAGNOSIS — E669 Obesity, unspecified: Secondary | ICD-10-CM | POA: Diagnosis not present

## 2022-01-12 DIAGNOSIS — I10 Essential (primary) hypertension: Secondary | ICD-10-CM | POA: Diagnosis not present

## 2022-01-12 DIAGNOSIS — R69 Illness, unspecified: Secondary | ICD-10-CM | POA: Diagnosis not present

## 2022-01-12 DIAGNOSIS — M1990 Unspecified osteoarthritis, unspecified site: Secondary | ICD-10-CM | POA: Diagnosis not present

## 2022-01-12 DIAGNOSIS — Z9181 History of falling: Secondary | ICD-10-CM | POA: Diagnosis not present

## 2022-01-12 DIAGNOSIS — R339 Retention of urine, unspecified: Secondary | ICD-10-CM | POA: Diagnosis not present

## 2022-01-12 DIAGNOSIS — E785 Hyperlipidemia, unspecified: Secondary | ICD-10-CM | POA: Diagnosis not present

## 2022-01-12 DIAGNOSIS — Z6834 Body mass index (BMI) 34.0-34.9, adult: Secondary | ICD-10-CM | POA: Diagnosis not present

## 2022-01-12 DIAGNOSIS — K529 Noninfective gastroenteritis and colitis, unspecified: Secondary | ICD-10-CM | POA: Diagnosis not present

## 2022-01-12 DIAGNOSIS — G35 Multiple sclerosis: Secondary | ICD-10-CM | POA: Diagnosis not present

## 2022-01-12 DIAGNOSIS — D649 Anemia, unspecified: Secondary | ICD-10-CM | POA: Diagnosis not present

## 2022-01-12 DIAGNOSIS — G0400 Acute disseminated encephalitis and encephalomyelitis, unspecified: Secondary | ICD-10-CM | POA: Diagnosis not present

## 2022-01-12 DIAGNOSIS — M25511 Pain in right shoulder: Secondary | ICD-10-CM | POA: Diagnosis not present

## 2022-01-12 DIAGNOSIS — Z8673 Personal history of transient ischemic attack (TIA), and cerebral infarction without residual deficits: Secondary | ICD-10-CM | POA: Diagnosis not present

## 2022-01-12 DIAGNOSIS — J9611 Chronic respiratory failure with hypoxia: Secondary | ICD-10-CM | POA: Diagnosis not present

## 2022-01-12 DIAGNOSIS — E119 Type 2 diabetes mellitus without complications: Secondary | ICD-10-CM | POA: Diagnosis not present

## 2022-01-12 DIAGNOSIS — Z87440 Personal history of urinary (tract) infections: Secondary | ICD-10-CM | POA: Diagnosis not present

## 2022-01-12 DIAGNOSIS — Z794 Long term (current) use of insulin: Secondary | ICD-10-CM | POA: Diagnosis not present

## 2022-01-12 DIAGNOSIS — M25512 Pain in left shoulder: Secondary | ICD-10-CM | POA: Diagnosis not present

## 2022-01-13 DIAGNOSIS — Z9181 History of falling: Secondary | ICD-10-CM | POA: Diagnosis not present

## 2022-01-13 DIAGNOSIS — M25512 Pain in left shoulder: Secondary | ICD-10-CM | POA: Diagnosis not present

## 2022-01-13 DIAGNOSIS — M1990 Unspecified osteoarthritis, unspecified site: Secondary | ICD-10-CM | POA: Diagnosis not present

## 2022-01-13 DIAGNOSIS — I10 Essential (primary) hypertension: Secondary | ICD-10-CM | POA: Diagnosis not present

## 2022-01-13 DIAGNOSIS — E785 Hyperlipidemia, unspecified: Secondary | ICD-10-CM | POA: Diagnosis not present

## 2022-01-13 DIAGNOSIS — Z87440 Personal history of urinary (tract) infections: Secondary | ICD-10-CM | POA: Diagnosis not present

## 2022-01-13 DIAGNOSIS — Z794 Long term (current) use of insulin: Secondary | ICD-10-CM | POA: Diagnosis not present

## 2022-01-13 DIAGNOSIS — M25511 Pain in right shoulder: Secondary | ICD-10-CM | POA: Diagnosis not present

## 2022-01-13 DIAGNOSIS — J9611 Chronic respiratory failure with hypoxia: Secondary | ICD-10-CM | POA: Diagnosis not present

## 2022-01-13 DIAGNOSIS — R339 Retention of urine, unspecified: Secondary | ICD-10-CM | POA: Diagnosis not present

## 2022-01-13 DIAGNOSIS — D649 Anemia, unspecified: Secondary | ICD-10-CM | POA: Diagnosis not present

## 2022-01-13 DIAGNOSIS — G35 Multiple sclerosis: Secondary | ICD-10-CM | POA: Diagnosis not present

## 2022-01-13 DIAGNOSIS — Z6834 Body mass index (BMI) 34.0-34.9, adult: Secondary | ICD-10-CM | POA: Diagnosis not present

## 2022-01-13 DIAGNOSIS — R69 Illness, unspecified: Secondary | ICD-10-CM | POA: Diagnosis not present

## 2022-01-13 DIAGNOSIS — E119 Type 2 diabetes mellitus without complications: Secondary | ICD-10-CM | POA: Diagnosis not present

## 2022-01-13 DIAGNOSIS — G0400 Acute disseminated encephalitis and encephalomyelitis, unspecified: Secondary | ICD-10-CM | POA: Diagnosis not present

## 2022-01-13 DIAGNOSIS — K529 Noninfective gastroenteritis and colitis, unspecified: Secondary | ICD-10-CM | POA: Diagnosis not present

## 2022-01-13 DIAGNOSIS — Z8673 Personal history of transient ischemic attack (TIA), and cerebral infarction without residual deficits: Secondary | ICD-10-CM | POA: Diagnosis not present

## 2022-01-13 DIAGNOSIS — E669 Obesity, unspecified: Secondary | ICD-10-CM | POA: Diagnosis not present

## 2022-01-15 DIAGNOSIS — D649 Anemia, unspecified: Secondary | ICD-10-CM | POA: Diagnosis not present

## 2022-01-15 DIAGNOSIS — I10 Essential (primary) hypertension: Secondary | ICD-10-CM | POA: Diagnosis not present

## 2022-01-15 DIAGNOSIS — E785 Hyperlipidemia, unspecified: Secondary | ICD-10-CM | POA: Diagnosis not present

## 2022-01-15 DIAGNOSIS — J9611 Chronic respiratory failure with hypoxia: Secondary | ICD-10-CM | POA: Diagnosis not present

## 2022-01-15 DIAGNOSIS — Z9181 History of falling: Secondary | ICD-10-CM | POA: Diagnosis not present

## 2022-01-15 DIAGNOSIS — K529 Noninfective gastroenteritis and colitis, unspecified: Secondary | ICD-10-CM | POA: Diagnosis not present

## 2022-01-15 DIAGNOSIS — G35 Multiple sclerosis: Secondary | ICD-10-CM | POA: Diagnosis not present

## 2022-01-15 DIAGNOSIS — R339 Retention of urine, unspecified: Secondary | ICD-10-CM | POA: Diagnosis not present

## 2022-01-15 DIAGNOSIS — R69 Illness, unspecified: Secondary | ICD-10-CM | POA: Diagnosis not present

## 2022-01-15 DIAGNOSIS — E119 Type 2 diabetes mellitus without complications: Secondary | ICD-10-CM | POA: Diagnosis not present

## 2022-01-15 DIAGNOSIS — E669 Obesity, unspecified: Secondary | ICD-10-CM | POA: Diagnosis not present

## 2022-01-15 DIAGNOSIS — M25511 Pain in right shoulder: Secondary | ICD-10-CM | POA: Diagnosis not present

## 2022-01-15 DIAGNOSIS — M25512 Pain in left shoulder: Secondary | ICD-10-CM | POA: Diagnosis not present

## 2022-01-15 DIAGNOSIS — Z8673 Personal history of transient ischemic attack (TIA), and cerebral infarction without residual deficits: Secondary | ICD-10-CM | POA: Diagnosis not present

## 2022-01-15 DIAGNOSIS — Z6834 Body mass index (BMI) 34.0-34.9, adult: Secondary | ICD-10-CM | POA: Diagnosis not present

## 2022-01-15 DIAGNOSIS — G0400 Acute disseminated encephalitis and encephalomyelitis, unspecified: Secondary | ICD-10-CM | POA: Diagnosis not present

## 2022-01-15 DIAGNOSIS — M1990 Unspecified osteoarthritis, unspecified site: Secondary | ICD-10-CM | POA: Diagnosis not present

## 2022-01-15 DIAGNOSIS — Z87440 Personal history of urinary (tract) infections: Secondary | ICD-10-CM | POA: Diagnosis not present

## 2022-01-15 DIAGNOSIS — Z794 Long term (current) use of insulin: Secondary | ICD-10-CM | POA: Diagnosis not present

## 2022-01-19 ENCOUNTER — Ambulatory Visit (HOSPITAL_COMMUNITY)
Admission: RE | Admit: 2022-01-19 | Discharge: 2022-01-19 | Disposition: A | Discharge status: Home / Self Care | Payer: Medicare HMO | Source: Ambulatory Visit | Attending: Physician Assistant | Admitting: Physician Assistant

## 2022-01-19 ENCOUNTER — Other Ambulatory Visit (HOSPITAL_COMMUNITY): Payer: Self-pay | Admitting: Physician Assistant

## 2022-01-19 DIAGNOSIS — K810 Acute cholecystitis: Secondary | ICD-10-CM | POA: Insufficient documentation

## 2022-01-19 DIAGNOSIS — Z434 Encounter for attention to other artificial openings of digestive tract: Secondary | ICD-10-CM | POA: Insufficient documentation

## 2022-01-19 HISTORY — PX: IR EXCHANGE BILIARY DRAIN: IMG6046

## 2022-01-19 MED ORDER — IOHEXOL 300 MG/ML  SOLN
50.0000 mL | Freq: Once | INTRAMUSCULAR | Status: AC | PRN
  Administered 2022-01-19: 10 mL

## 2022-01-19 MED ORDER — LIDOCAINE-EPINEPHRINE 1 %-1:100000 IJ SOLN
INTRAMUSCULAR | Status: AC
  Administered 2022-01-19: 5 mL
  Filled 2022-01-19: qty 1

## 2022-01-19 NOTE — Procedures (Signed)
Vascular and Interventional Radiology Procedure Note  Patient: Troy Randall DOB: 04-Feb-1960 Medical Record Number: [id number] Note Date/Time: 01/19/22 2:31 PM   Performing Physician: Michaelle Birks, MD Assistant(s): None  Diagnosis: Hx of acute cholecystitis. Drain placed 09/08/21  Procedure:  CHOLECYSTOSTOMY TUBE EXCHANGE ANTEROGRADE CHOLANGIOGRAM  Anesthesia: Local Anesthetic Complications: None Estimated Blood Loss: Minimal Specimens:  None  Findings:  Successful placement of 17F cholecystostomy tube. Patent cystic duct. Distal CBD stone demonstrated c/w choledocholithiasis. No biliary ductal dilatation.  Plan: Continue w routine drain care. Follow up for routine tube evaluation in 8 week(s).   See detailed procedure note with images in PACS. The patient tolerated the procedure well without incident or complication and was returned to Recovery in stable condition.    Michaelle Birks, MD [facility] Radiology   Pager. [place]

## 2022-01-20 ENCOUNTER — Telehealth: Payer: Self-pay | Admitting: *Deleted

## 2022-01-20 ENCOUNTER — Other Ambulatory Visit: Payer: Self-pay | Admitting: Neurology

## 2022-01-20 DIAGNOSIS — Z8673 Personal history of transient ischemic attack (TIA), and cerebral infarction without residual deficits: Secondary | ICD-10-CM | POA: Diagnosis not present

## 2022-01-20 DIAGNOSIS — Z6834 Body mass index (BMI) 34.0-34.9, adult: Secondary | ICD-10-CM | POA: Diagnosis not present

## 2022-01-20 DIAGNOSIS — Z9181 History of falling: Secondary | ICD-10-CM | POA: Diagnosis not present

## 2022-01-20 DIAGNOSIS — G35 Multiple sclerosis: Secondary | ICD-10-CM | POA: Diagnosis not present

## 2022-01-20 DIAGNOSIS — G969 Disorder of central nervous system, unspecified: Secondary | ICD-10-CM

## 2022-01-20 DIAGNOSIS — J9611 Chronic respiratory failure with hypoxia: Secondary | ICD-10-CM | POA: Diagnosis not present

## 2022-01-20 DIAGNOSIS — G0400 Acute disseminated encephalitis and encephalomyelitis, unspecified: Secondary | ICD-10-CM | POA: Diagnosis not present

## 2022-01-20 DIAGNOSIS — R69 Illness, unspecified: Secondary | ICD-10-CM | POA: Diagnosis not present

## 2022-01-20 DIAGNOSIS — E669 Obesity, unspecified: Secondary | ICD-10-CM | POA: Diagnosis not present

## 2022-01-20 DIAGNOSIS — Z794 Long term (current) use of insulin: Secondary | ICD-10-CM | POA: Diagnosis not present

## 2022-01-20 DIAGNOSIS — D649 Anemia, unspecified: Secondary | ICD-10-CM | POA: Diagnosis not present

## 2022-01-20 DIAGNOSIS — M25511 Pain in right shoulder: Secondary | ICD-10-CM | POA: Diagnosis not present

## 2022-01-20 DIAGNOSIS — I10 Essential (primary) hypertension: Secondary | ICD-10-CM | POA: Diagnosis not present

## 2022-01-20 DIAGNOSIS — M1990 Unspecified osteoarthritis, unspecified site: Secondary | ICD-10-CM | POA: Diagnosis not present

## 2022-01-20 DIAGNOSIS — E785 Hyperlipidemia, unspecified: Secondary | ICD-10-CM | POA: Diagnosis not present

## 2022-01-20 DIAGNOSIS — K529 Noninfective gastroenteritis and colitis, unspecified: Secondary | ICD-10-CM | POA: Diagnosis not present

## 2022-01-20 DIAGNOSIS — E119 Type 2 diabetes mellitus without complications: Secondary | ICD-10-CM | POA: Diagnosis not present

## 2022-01-20 DIAGNOSIS — R339 Retention of urine, unspecified: Secondary | ICD-10-CM | POA: Diagnosis not present

## 2022-01-20 DIAGNOSIS — Z87440 Personal history of urinary (tract) infections: Secondary | ICD-10-CM | POA: Diagnosis not present

## 2022-01-20 DIAGNOSIS — M25512 Pain in left shoulder: Secondary | ICD-10-CM | POA: Diagnosis not present

## 2022-01-20 NOTE — Telephone Encounter (Signed)
Called and spoke with pt wife. Relayed Dr. Garth Bigness message below:  "Please let the wife know that Dr. Letta Pate of rehab sent me a message.  He felt that there has not been any recent improvement.  I was planning on rechecking an MRI around March but because there has been no further improvement I would like to check an MRI of the brain with and without contrast sooner.We can order that to be done in the next week or 2.  (Encephalomyelitis):"  Wife verbalized understanding and agreeable to proceed with MRI. States he has to go to [facility] d/t having certain implant. This info has to be placed on MRI order. States it can be found on previous orders back in August/September 2023. MD will need to include this.  I did find this note from MRI brain 09/20/21: " Patient had endoscopy capsule removed from rectum today per GI. Does have hemostatic clip in colon, it is a Dance movement psychotherapist 360. Please reach out to them with any settings but per endoscopy suite and GI they are MRI safe."

## 2022-01-21 DIAGNOSIS — G35 Multiple sclerosis: Secondary | ICD-10-CM | POA: Diagnosis not present

## 2022-01-21 DIAGNOSIS — Z8673 Personal history of transient ischemic attack (TIA), and cerebral infarction without residual deficits: Secondary | ICD-10-CM | POA: Diagnosis not present

## 2022-01-21 DIAGNOSIS — K529 Noninfective gastroenteritis and colitis, unspecified: Secondary | ICD-10-CM | POA: Diagnosis not present

## 2022-01-21 DIAGNOSIS — E785 Hyperlipidemia, unspecified: Secondary | ICD-10-CM | POA: Diagnosis not present

## 2022-01-21 DIAGNOSIS — R69 Illness, unspecified: Secondary | ICD-10-CM | POA: Diagnosis not present

## 2022-01-21 DIAGNOSIS — I10 Essential (primary) hypertension: Secondary | ICD-10-CM | POA: Diagnosis not present

## 2022-01-21 DIAGNOSIS — E119 Type 2 diabetes mellitus without complications: Secondary | ICD-10-CM | POA: Diagnosis not present

## 2022-01-21 DIAGNOSIS — D649 Anemia, unspecified: Secondary | ICD-10-CM | POA: Diagnosis not present

## 2022-01-21 DIAGNOSIS — M1990 Unspecified osteoarthritis, unspecified site: Secondary | ICD-10-CM | POA: Diagnosis not present

## 2022-01-21 DIAGNOSIS — Z794 Long term (current) use of insulin: Secondary | ICD-10-CM | POA: Diagnosis not present

## 2022-01-21 DIAGNOSIS — M25512 Pain in left shoulder: Secondary | ICD-10-CM | POA: Diagnosis not present

## 2022-01-21 DIAGNOSIS — E669 Obesity, unspecified: Secondary | ICD-10-CM | POA: Diagnosis not present

## 2022-01-21 DIAGNOSIS — M25511 Pain in right shoulder: Secondary | ICD-10-CM | POA: Diagnosis not present

## 2022-01-21 DIAGNOSIS — G0400 Acute disseminated encephalitis and encephalomyelitis, unspecified: Secondary | ICD-10-CM | POA: Diagnosis not present

## 2022-01-21 DIAGNOSIS — Z87440 Personal history of urinary (tract) infections: Secondary | ICD-10-CM | POA: Diagnosis not present

## 2022-01-21 DIAGNOSIS — Z9181 History of falling: Secondary | ICD-10-CM | POA: Diagnosis not present

## 2022-01-21 DIAGNOSIS — R339 Retention of urine, unspecified: Secondary | ICD-10-CM | POA: Diagnosis not present

## 2022-01-21 DIAGNOSIS — J9611 Chronic respiratory failure with hypoxia: Secondary | ICD-10-CM | POA: Diagnosis not present

## 2022-01-21 DIAGNOSIS — Z6834 Body mass index (BMI) 34.0-34.9, adult: Secondary | ICD-10-CM | POA: Diagnosis not present

## 2022-01-22 DIAGNOSIS — Z6834 Body mass index (BMI) 34.0-34.9, adult: Secondary | ICD-10-CM | POA: Diagnosis not present

## 2022-01-22 DIAGNOSIS — R339 Retention of urine, unspecified: Secondary | ICD-10-CM | POA: Diagnosis not present

## 2022-01-22 DIAGNOSIS — R69 Illness, unspecified: Secondary | ICD-10-CM | POA: Diagnosis not present

## 2022-01-22 DIAGNOSIS — E119 Type 2 diabetes mellitus without complications: Secondary | ICD-10-CM | POA: Diagnosis not present

## 2022-01-22 DIAGNOSIS — Z87440 Personal history of urinary (tract) infections: Secondary | ICD-10-CM | POA: Diagnosis not present

## 2022-01-22 DIAGNOSIS — M25511 Pain in right shoulder: Secondary | ICD-10-CM | POA: Diagnosis not present

## 2022-01-22 DIAGNOSIS — E785 Hyperlipidemia, unspecified: Secondary | ICD-10-CM | POA: Diagnosis not present

## 2022-01-22 DIAGNOSIS — M25512 Pain in left shoulder: Secondary | ICD-10-CM | POA: Diagnosis not present

## 2022-01-22 DIAGNOSIS — M1990 Unspecified osteoarthritis, unspecified site: Secondary | ICD-10-CM | POA: Diagnosis not present

## 2022-01-22 DIAGNOSIS — J9611 Chronic respiratory failure with hypoxia: Secondary | ICD-10-CM | POA: Diagnosis not present

## 2022-01-22 DIAGNOSIS — E669 Obesity, unspecified: Secondary | ICD-10-CM | POA: Diagnosis not present

## 2022-01-22 DIAGNOSIS — G35 Multiple sclerosis: Secondary | ICD-10-CM | POA: Diagnosis not present

## 2022-01-22 DIAGNOSIS — G0400 Acute disseminated encephalitis and encephalomyelitis, unspecified: Secondary | ICD-10-CM | POA: Diagnosis not present

## 2022-01-22 DIAGNOSIS — Z9181 History of falling: Secondary | ICD-10-CM | POA: Diagnosis not present

## 2022-01-22 DIAGNOSIS — D649 Anemia, unspecified: Secondary | ICD-10-CM | POA: Diagnosis not present

## 2022-01-22 DIAGNOSIS — Z8673 Personal history of transient ischemic attack (TIA), and cerebral infarction without residual deficits: Secondary | ICD-10-CM | POA: Diagnosis not present

## 2022-01-22 DIAGNOSIS — K529 Noninfective gastroenteritis and colitis, unspecified: Secondary | ICD-10-CM | POA: Diagnosis not present

## 2022-01-22 DIAGNOSIS — I10 Essential (primary) hypertension: Secondary | ICD-10-CM | POA: Diagnosis not present

## 2022-01-22 DIAGNOSIS — Z794 Long term (current) use of insulin: Secondary | ICD-10-CM | POA: Diagnosis not present

## 2022-01-24 DIAGNOSIS — J9611 Chronic respiratory failure with hypoxia: Secondary | ICD-10-CM | POA: Diagnosis not present

## 2022-01-24 DIAGNOSIS — M5440 Lumbago with sciatica, unspecified side: Secondary | ICD-10-CM | POA: Diagnosis not present

## 2022-01-24 DIAGNOSIS — R5381 Other malaise: Secondary | ICD-10-CM | POA: Diagnosis not present

## 2022-01-24 DIAGNOSIS — G8929 Other chronic pain: Secondary | ICD-10-CM | POA: Diagnosis not present

## 2022-01-24 DIAGNOSIS — G0400 Acute disseminated encephalitis and encephalomyelitis, unspecified: Secondary | ICD-10-CM | POA: Diagnosis not present

## 2022-01-25 DIAGNOSIS — J9611 Chronic respiratory failure with hypoxia: Secondary | ICD-10-CM | POA: Diagnosis not present

## 2022-01-25 DIAGNOSIS — G0400 Acute disseminated encephalitis and encephalomyelitis, unspecified: Secondary | ICD-10-CM | POA: Diagnosis not present

## 2022-01-25 DIAGNOSIS — M5440 Lumbago with sciatica, unspecified side: Secondary | ICD-10-CM | POA: Diagnosis not present

## 2022-01-25 DIAGNOSIS — G8929 Other chronic pain: Secondary | ICD-10-CM | POA: Diagnosis not present

## 2022-01-25 DIAGNOSIS — R5381 Other malaise: Secondary | ICD-10-CM | POA: Diagnosis not present

## 2022-01-26 DIAGNOSIS — D500 Iron deficiency anemia secondary to blood loss (chronic): Secondary | ICD-10-CM | POA: Diagnosis not present

## 2022-01-26 DIAGNOSIS — J9611 Chronic respiratory failure with hypoxia: Secondary | ICD-10-CM | POA: Diagnosis not present

## 2022-01-26 DIAGNOSIS — G0400 Acute disseminated encephalitis and encephalomyelitis, unspecified: Secondary | ICD-10-CM | POA: Diagnosis not present

## 2022-01-26 DIAGNOSIS — M5440 Lumbago with sciatica, unspecified side: Secondary | ICD-10-CM | POA: Diagnosis not present

## 2022-01-26 DIAGNOSIS — R5381 Other malaise: Secondary | ICD-10-CM | POA: Diagnosis not present

## 2022-01-26 DIAGNOSIS — G9340 Encephalopathy, unspecified: Secondary | ICD-10-CM | POA: Diagnosis not present

## 2022-01-26 DIAGNOSIS — K811 Chronic cholecystitis: Secondary | ICD-10-CM | POA: Diagnosis not present

## 2022-01-26 DIAGNOSIS — G8929 Other chronic pain: Secondary | ICD-10-CM | POA: Diagnosis not present

## 2022-01-27 DIAGNOSIS — Z794 Long term (current) use of insulin: Secondary | ICD-10-CM | POA: Diagnosis not present

## 2022-01-27 DIAGNOSIS — E669 Obesity, unspecified: Secondary | ICD-10-CM | POA: Diagnosis not present

## 2022-01-27 DIAGNOSIS — J9611 Chronic respiratory failure with hypoxia: Secondary | ICD-10-CM | POA: Diagnosis not present

## 2022-01-27 DIAGNOSIS — E785 Hyperlipidemia, unspecified: Secondary | ICD-10-CM | POA: Diagnosis not present

## 2022-01-27 DIAGNOSIS — G35 Multiple sclerosis: Secondary | ICD-10-CM | POA: Diagnosis not present

## 2022-01-27 DIAGNOSIS — M25511 Pain in right shoulder: Secondary | ICD-10-CM | POA: Diagnosis not present

## 2022-01-27 DIAGNOSIS — R69 Illness, unspecified: Secondary | ICD-10-CM | POA: Diagnosis not present

## 2022-01-27 DIAGNOSIS — M25512 Pain in left shoulder: Secondary | ICD-10-CM | POA: Diagnosis not present

## 2022-01-27 DIAGNOSIS — Z6834 Body mass index (BMI) 34.0-34.9, adult: Secondary | ICD-10-CM | POA: Diagnosis not present

## 2022-01-27 DIAGNOSIS — Z8673 Personal history of transient ischemic attack (TIA), and cerebral infarction without residual deficits: Secondary | ICD-10-CM | POA: Diagnosis not present

## 2022-01-27 DIAGNOSIS — Z9181 History of falling: Secondary | ICD-10-CM | POA: Diagnosis not present

## 2022-01-27 DIAGNOSIS — Z87440 Personal history of urinary (tract) infections: Secondary | ICD-10-CM | POA: Diagnosis not present

## 2022-01-27 DIAGNOSIS — M1990 Unspecified osteoarthritis, unspecified site: Secondary | ICD-10-CM | POA: Diagnosis not present

## 2022-01-27 DIAGNOSIS — R339 Retention of urine, unspecified: Secondary | ICD-10-CM | POA: Diagnosis not present

## 2022-01-27 DIAGNOSIS — I10 Essential (primary) hypertension: Secondary | ICD-10-CM | POA: Diagnosis not present

## 2022-01-27 DIAGNOSIS — K529 Noninfective gastroenteritis and colitis, unspecified: Secondary | ICD-10-CM | POA: Diagnosis not present

## 2022-01-27 DIAGNOSIS — E119 Type 2 diabetes mellitus without complications: Secondary | ICD-10-CM | POA: Diagnosis not present

## 2022-01-27 DIAGNOSIS — G0400 Acute disseminated encephalitis and encephalomyelitis, unspecified: Secondary | ICD-10-CM | POA: Diagnosis not present

## 2022-01-27 DIAGNOSIS — D649 Anemia, unspecified: Secondary | ICD-10-CM | POA: Diagnosis not present

## 2022-01-28 DIAGNOSIS — Z9181 History of falling: Secondary | ICD-10-CM | POA: Diagnosis not present

## 2022-01-28 DIAGNOSIS — R339 Retention of urine, unspecified: Secondary | ICD-10-CM | POA: Diagnosis not present

## 2022-01-28 DIAGNOSIS — Z8673 Personal history of transient ischemic attack (TIA), and cerebral infarction without residual deficits: Secondary | ICD-10-CM | POA: Diagnosis not present

## 2022-01-28 DIAGNOSIS — K529 Noninfective gastroenteritis and colitis, unspecified: Secondary | ICD-10-CM | POA: Diagnosis not present

## 2022-01-28 DIAGNOSIS — M25512 Pain in left shoulder: Secondary | ICD-10-CM | POA: Diagnosis not present

## 2022-01-28 DIAGNOSIS — G0400 Acute disseminated encephalitis and encephalomyelitis, unspecified: Secondary | ICD-10-CM | POA: Diagnosis not present

## 2022-01-28 DIAGNOSIS — E119 Type 2 diabetes mellitus without complications: Secondary | ICD-10-CM | POA: Diagnosis not present

## 2022-01-28 DIAGNOSIS — J9611 Chronic respiratory failure with hypoxia: Secondary | ICD-10-CM | POA: Diagnosis not present

## 2022-01-28 DIAGNOSIS — G35 Multiple sclerosis: Secondary | ICD-10-CM | POA: Diagnosis not present

## 2022-01-28 DIAGNOSIS — R69 Illness, unspecified: Secondary | ICD-10-CM | POA: Diagnosis not present

## 2022-01-28 DIAGNOSIS — M1990 Unspecified osteoarthritis, unspecified site: Secondary | ICD-10-CM | POA: Diagnosis not present

## 2022-01-28 DIAGNOSIS — Z87440 Personal history of urinary (tract) infections: Secondary | ICD-10-CM | POA: Diagnosis not present

## 2022-01-28 DIAGNOSIS — M25511 Pain in right shoulder: Secondary | ICD-10-CM | POA: Diagnosis not present

## 2022-01-28 DIAGNOSIS — E669 Obesity, unspecified: Secondary | ICD-10-CM | POA: Diagnosis not present

## 2022-01-28 DIAGNOSIS — D649 Anemia, unspecified: Secondary | ICD-10-CM | POA: Diagnosis not present

## 2022-01-28 DIAGNOSIS — I10 Essential (primary) hypertension: Secondary | ICD-10-CM | POA: Diagnosis not present

## 2022-01-28 DIAGNOSIS — E785 Hyperlipidemia, unspecified: Secondary | ICD-10-CM | POA: Diagnosis not present

## 2022-01-28 DIAGNOSIS — Z794 Long term (current) use of insulin: Secondary | ICD-10-CM | POA: Diagnosis not present

## 2022-01-28 DIAGNOSIS — Z6834 Body mass index (BMI) 34.0-34.9, adult: Secondary | ICD-10-CM | POA: Diagnosis not present

## 2022-01-30 ENCOUNTER — Telehealth: Payer: Self-pay | Admitting: Neurology

## 2022-01-30 NOTE — Telephone Encounter (Signed)
Troy Randall: [id number] exp. 01/30/22-07/29/22 sent to [facility]

## 2022-02-02 ENCOUNTER — Other Ambulatory Visit: Payer: Self-pay | Admitting: Neurology

## 2022-02-02 DIAGNOSIS — G379 Demyelinating disease of central nervous system, unspecified: Secondary | ICD-10-CM

## 2022-02-02 NOTE — Telephone Encounter (Signed)
I spoke to the patient's wife and she will call [facility] to get the MRI scheduled.

## 2022-02-02 NOTE — Telephone Encounter (Signed)
The patient's wife called and requested that this MRI be done under anesthesia. Can you please update the order?

## 2022-02-05 ENCOUNTER — Telehealth: Payer: Self-pay | Admitting: Neurology

## 2022-02-05 DIAGNOSIS — Z6834 Body mass index (BMI) 34.0-34.9, adult: Secondary | ICD-10-CM | POA: Diagnosis not present

## 2022-02-05 DIAGNOSIS — G0400 Acute disseminated encephalitis and encephalomyelitis, unspecified: Secondary | ICD-10-CM | POA: Diagnosis not present

## 2022-02-05 DIAGNOSIS — D649 Anemia, unspecified: Secondary | ICD-10-CM | POA: Diagnosis not present

## 2022-02-05 DIAGNOSIS — M1990 Unspecified osteoarthritis, unspecified site: Secondary | ICD-10-CM | POA: Diagnosis not present

## 2022-02-05 DIAGNOSIS — G35 Multiple sclerosis: Secondary | ICD-10-CM | POA: Diagnosis not present

## 2022-02-05 DIAGNOSIS — I10 Essential (primary) hypertension: Secondary | ICD-10-CM | POA: Diagnosis not present

## 2022-02-05 DIAGNOSIS — R339 Retention of urine, unspecified: Secondary | ICD-10-CM | POA: Diagnosis not present

## 2022-02-05 DIAGNOSIS — E785 Hyperlipidemia, unspecified: Secondary | ICD-10-CM | POA: Diagnosis not present

## 2022-02-05 DIAGNOSIS — Z8673 Personal history of transient ischemic attack (TIA), and cerebral infarction without residual deficits: Secondary | ICD-10-CM | POA: Diagnosis not present

## 2022-02-05 DIAGNOSIS — R69 Illness, unspecified: Secondary | ICD-10-CM | POA: Diagnosis not present

## 2022-02-05 DIAGNOSIS — Z466 Encounter for fitting and adjustment of urinary device: Secondary | ICD-10-CM | POA: Diagnosis not present

## 2022-02-05 DIAGNOSIS — J9611 Chronic respiratory failure with hypoxia: Secondary | ICD-10-CM | POA: Diagnosis not present

## 2022-02-05 DIAGNOSIS — Z794 Long term (current) use of insulin: Secondary | ICD-10-CM | POA: Diagnosis not present

## 2022-02-05 DIAGNOSIS — Z9181 History of falling: Secondary | ICD-10-CM | POA: Diagnosis not present

## 2022-02-05 DIAGNOSIS — E119 Type 2 diabetes mellitus without complications: Secondary | ICD-10-CM | POA: Diagnosis not present

## 2022-02-05 DIAGNOSIS — Z436 Encounter for attention to other artificial openings of urinary tract: Secondary | ICD-10-CM | POA: Diagnosis not present

## 2022-02-05 DIAGNOSIS — Z87440 Personal history of urinary (tract) infections: Secondary | ICD-10-CM | POA: Diagnosis not present

## 2022-02-05 DIAGNOSIS — E669 Obesity, unspecified: Secondary | ICD-10-CM | POA: Diagnosis not present

## 2022-02-05 NOTE — Telephone Encounter (Signed)
Wife is asking if Dr Felecia Shelling would want the appointment 3-6 to be pushed out to allow time for MRI results from 3-05 or if he will have had time to review results, please call.

## 2022-02-06 DIAGNOSIS — I10 Essential (primary) hypertension: Secondary | ICD-10-CM | POA: Diagnosis not present

## 2022-02-06 DIAGNOSIS — Z794 Long term (current) use of insulin: Secondary | ICD-10-CM | POA: Diagnosis not present

## 2022-02-06 DIAGNOSIS — R69 Illness, unspecified: Secondary | ICD-10-CM | POA: Diagnosis not present

## 2022-02-06 DIAGNOSIS — Z87440 Personal history of urinary (tract) infections: Secondary | ICD-10-CM | POA: Diagnosis not present

## 2022-02-06 DIAGNOSIS — G0400 Acute disseminated encephalitis and encephalomyelitis, unspecified: Secondary | ICD-10-CM | POA: Diagnosis not present

## 2022-02-06 DIAGNOSIS — Z466 Encounter for fitting and adjustment of urinary device: Secondary | ICD-10-CM | POA: Diagnosis not present

## 2022-02-06 DIAGNOSIS — Z8673 Personal history of transient ischemic attack (TIA), and cerebral infarction without residual deficits: Secondary | ICD-10-CM | POA: Diagnosis not present

## 2022-02-06 DIAGNOSIS — M1990 Unspecified osteoarthritis, unspecified site: Secondary | ICD-10-CM | POA: Diagnosis not present

## 2022-02-06 DIAGNOSIS — E785 Hyperlipidemia, unspecified: Secondary | ICD-10-CM | POA: Diagnosis not present

## 2022-02-06 DIAGNOSIS — Z9181 History of falling: Secondary | ICD-10-CM | POA: Diagnosis not present

## 2022-02-06 DIAGNOSIS — Z6834 Body mass index (BMI) 34.0-34.9, adult: Secondary | ICD-10-CM | POA: Diagnosis not present

## 2022-02-06 DIAGNOSIS — G35 Multiple sclerosis: Secondary | ICD-10-CM | POA: Diagnosis not present

## 2022-02-06 DIAGNOSIS — Z436 Encounter for attention to other artificial openings of urinary tract: Secondary | ICD-10-CM | POA: Diagnosis not present

## 2022-02-06 DIAGNOSIS — J9611 Chronic respiratory failure with hypoxia: Secondary | ICD-10-CM | POA: Diagnosis not present

## 2022-02-06 DIAGNOSIS — E119 Type 2 diabetes mellitus without complications: Secondary | ICD-10-CM | POA: Diagnosis not present

## 2022-02-06 DIAGNOSIS — R339 Retention of urine, unspecified: Secondary | ICD-10-CM | POA: Diagnosis not present

## 2022-02-06 DIAGNOSIS — E669 Obesity, unspecified: Secondary | ICD-10-CM | POA: Diagnosis not present

## 2022-02-06 DIAGNOSIS — D649 Anemia, unspecified: Secondary | ICD-10-CM | POA: Diagnosis not present

## 2022-02-09 ENCOUNTER — Telehealth: Payer: Medicare HMO | Admitting: Neurology

## 2022-02-09 NOTE — Telephone Encounter (Signed)
Pt has been scheduled for today 02/09/22 @ 2:30pm Mychart

## 2022-02-09 NOTE — Telephone Encounter (Signed)
Please call wife and let her know he will need updated visit within 30 days of scheduled MRI with Dr. Felecia Shelling. Please call and offer 02/10/22 at 9:30am with Dr. Felecia Shelling. Can be mychart virtual visit if easier for them.

## 2022-02-09 NOTE — Telephone Encounter (Signed)
Pt agreed to the change.

## 2022-02-09 NOTE — Telephone Encounter (Signed)
Please call back and offer 02/19 at 3pm instead. Today will not work since its 31 days from scheduled MRI

## 2022-02-09 NOTE — Telephone Encounter (Signed)
Noted, r/s appt to 02/23/22 at 3pm with Dr. Felecia Shelling

## 2022-02-09 NOTE — Telephone Encounter (Signed)
Called Pt's Wife and told her that Dr. Felecia Shelling said that as long as pt got his MRI with a [place] he will be able to read it before appointment. Pt verbalized understanding.

## 2022-02-12 DIAGNOSIS — J9611 Chronic respiratory failure with hypoxia: Secondary | ICD-10-CM | POA: Diagnosis not present

## 2022-02-12 DIAGNOSIS — E119 Type 2 diabetes mellitus without complications: Secondary | ICD-10-CM | POA: Diagnosis not present

## 2022-02-12 DIAGNOSIS — E785 Hyperlipidemia, unspecified: Secondary | ICD-10-CM | POA: Diagnosis not present

## 2022-02-12 DIAGNOSIS — Z9181 History of falling: Secondary | ICD-10-CM | POA: Diagnosis not present

## 2022-02-12 DIAGNOSIS — I10 Essential (primary) hypertension: Secondary | ICD-10-CM | POA: Diagnosis not present

## 2022-02-12 DIAGNOSIS — R339 Retention of urine, unspecified: Secondary | ICD-10-CM | POA: Diagnosis not present

## 2022-02-12 DIAGNOSIS — Z466 Encounter for fitting and adjustment of urinary device: Secondary | ICD-10-CM | POA: Diagnosis not present

## 2022-02-12 DIAGNOSIS — Z436 Encounter for attention to other artificial openings of urinary tract: Secondary | ICD-10-CM | POA: Diagnosis not present

## 2022-02-12 DIAGNOSIS — G0400 Acute disseminated encephalitis and encephalomyelitis, unspecified: Secondary | ICD-10-CM | POA: Diagnosis not present

## 2022-02-12 DIAGNOSIS — Z794 Long term (current) use of insulin: Secondary | ICD-10-CM | POA: Diagnosis not present

## 2022-02-12 DIAGNOSIS — D649 Anemia, unspecified: Secondary | ICD-10-CM | POA: Diagnosis not present

## 2022-02-12 DIAGNOSIS — M1990 Unspecified osteoarthritis, unspecified site: Secondary | ICD-10-CM | POA: Diagnosis not present

## 2022-02-12 DIAGNOSIS — Z8673 Personal history of transient ischemic attack (TIA), and cerebral infarction without residual deficits: Secondary | ICD-10-CM | POA: Diagnosis not present

## 2022-02-12 DIAGNOSIS — R69 Illness, unspecified: Secondary | ICD-10-CM | POA: Diagnosis not present

## 2022-02-12 DIAGNOSIS — Z87440 Personal history of urinary (tract) infections: Secondary | ICD-10-CM | POA: Diagnosis not present

## 2022-02-12 DIAGNOSIS — Z6834 Body mass index (BMI) 34.0-34.9, adult: Secondary | ICD-10-CM | POA: Diagnosis not present

## 2022-02-12 DIAGNOSIS — G35 Multiple sclerosis: Secondary | ICD-10-CM | POA: Diagnosis not present

## 2022-02-12 DIAGNOSIS — E669 Obesity, unspecified: Secondary | ICD-10-CM | POA: Diagnosis not present

## 2022-02-13 DIAGNOSIS — D649 Anemia, unspecified: Secondary | ICD-10-CM | POA: Diagnosis not present

## 2022-02-13 DIAGNOSIS — Z8673 Personal history of transient ischemic attack (TIA), and cerebral infarction without residual deficits: Secondary | ICD-10-CM | POA: Diagnosis not present

## 2022-02-13 DIAGNOSIS — Z6834 Body mass index (BMI) 34.0-34.9, adult: Secondary | ICD-10-CM | POA: Diagnosis not present

## 2022-02-13 DIAGNOSIS — E785 Hyperlipidemia, unspecified: Secondary | ICD-10-CM | POA: Diagnosis not present

## 2022-02-13 DIAGNOSIS — R69 Illness, unspecified: Secondary | ICD-10-CM | POA: Diagnosis not present

## 2022-02-13 DIAGNOSIS — R339 Retention of urine, unspecified: Secondary | ICD-10-CM | POA: Diagnosis not present

## 2022-02-13 DIAGNOSIS — Z466 Encounter for fitting and adjustment of urinary device: Secondary | ICD-10-CM | POA: Diagnosis not present

## 2022-02-13 DIAGNOSIS — Z9181 History of falling: Secondary | ICD-10-CM | POA: Diagnosis not present

## 2022-02-13 DIAGNOSIS — Z87440 Personal history of urinary (tract) infections: Secondary | ICD-10-CM | POA: Diagnosis not present

## 2022-02-13 DIAGNOSIS — I10 Essential (primary) hypertension: Secondary | ICD-10-CM | POA: Diagnosis not present

## 2022-02-13 DIAGNOSIS — G35 Multiple sclerosis: Secondary | ICD-10-CM | POA: Diagnosis not present

## 2022-02-13 DIAGNOSIS — Z436 Encounter for attention to other artificial openings of urinary tract: Secondary | ICD-10-CM | POA: Diagnosis not present

## 2022-02-13 DIAGNOSIS — E669 Obesity, unspecified: Secondary | ICD-10-CM | POA: Diagnosis not present

## 2022-02-13 DIAGNOSIS — Z794 Long term (current) use of insulin: Secondary | ICD-10-CM | POA: Diagnosis not present

## 2022-02-13 DIAGNOSIS — M1990 Unspecified osteoarthritis, unspecified site: Secondary | ICD-10-CM | POA: Diagnosis not present

## 2022-02-13 DIAGNOSIS — J9611 Chronic respiratory failure with hypoxia: Secondary | ICD-10-CM | POA: Diagnosis not present

## 2022-02-13 DIAGNOSIS — G0400 Acute disseminated encephalitis and encephalomyelitis, unspecified: Secondary | ICD-10-CM | POA: Diagnosis not present

## 2022-02-13 DIAGNOSIS — E119 Type 2 diabetes mellitus without complications: Secondary | ICD-10-CM | POA: Diagnosis not present

## 2022-02-16 DIAGNOSIS — R69 Illness, unspecified: Secondary | ICD-10-CM | POA: Diagnosis not present

## 2022-02-16 DIAGNOSIS — D649 Anemia, unspecified: Secondary | ICD-10-CM | POA: Diagnosis not present

## 2022-02-16 DIAGNOSIS — M1990 Unspecified osteoarthritis, unspecified site: Secondary | ICD-10-CM | POA: Diagnosis not present

## 2022-02-16 DIAGNOSIS — E119 Type 2 diabetes mellitus without complications: Secondary | ICD-10-CM | POA: Diagnosis not present

## 2022-02-16 DIAGNOSIS — Z466 Encounter for fitting and adjustment of urinary device: Secondary | ICD-10-CM | POA: Diagnosis not present

## 2022-02-16 DIAGNOSIS — R339 Retention of urine, unspecified: Secondary | ICD-10-CM | POA: Diagnosis not present

## 2022-02-16 DIAGNOSIS — Z9181 History of falling: Secondary | ICD-10-CM | POA: Diagnosis not present

## 2022-02-16 DIAGNOSIS — Z87440 Personal history of urinary (tract) infections: Secondary | ICD-10-CM | POA: Diagnosis not present

## 2022-02-16 DIAGNOSIS — Z436 Encounter for attention to other artificial openings of urinary tract: Secondary | ICD-10-CM | POA: Diagnosis not present

## 2022-02-16 DIAGNOSIS — Z6834 Body mass index (BMI) 34.0-34.9, adult: Secondary | ICD-10-CM | POA: Diagnosis not present

## 2022-02-16 DIAGNOSIS — Z794 Long term (current) use of insulin: Secondary | ICD-10-CM | POA: Diagnosis not present

## 2022-02-16 DIAGNOSIS — Z8673 Personal history of transient ischemic attack (TIA), and cerebral infarction without residual deficits: Secondary | ICD-10-CM | POA: Diagnosis not present

## 2022-02-16 DIAGNOSIS — E669 Obesity, unspecified: Secondary | ICD-10-CM | POA: Diagnosis not present

## 2022-02-16 DIAGNOSIS — I10 Essential (primary) hypertension: Secondary | ICD-10-CM | POA: Diagnosis not present

## 2022-02-16 DIAGNOSIS — G35 Multiple sclerosis: Secondary | ICD-10-CM | POA: Diagnosis not present

## 2022-02-16 DIAGNOSIS — E785 Hyperlipidemia, unspecified: Secondary | ICD-10-CM | POA: Diagnosis not present

## 2022-02-16 DIAGNOSIS — G0400 Acute disseminated encephalitis and encephalomyelitis, unspecified: Secondary | ICD-10-CM | POA: Diagnosis not present

## 2022-02-16 DIAGNOSIS — R350 Frequency of micturition: Secondary | ICD-10-CM | POA: Diagnosis not present

## 2022-02-16 DIAGNOSIS — J9611 Chronic respiratory failure with hypoxia: Secondary | ICD-10-CM | POA: Diagnosis not present

## 2022-02-18 ENCOUNTER — Other Ambulatory Visit: Payer: Self-pay | Admitting: Radiology

## 2022-02-19 ENCOUNTER — Other Ambulatory Visit (HOSPITAL_COMMUNITY): Payer: Self-pay | Admitting: Interventional Radiology

## 2022-02-19 ENCOUNTER — Ambulatory Visit (HOSPITAL_COMMUNITY)
Admission: RE | Admit: 2022-02-19 | Discharge: 2022-02-19 | Disposition: A | Discharge status: Home / Self Care | Payer: Medicare HMO | Source: Ambulatory Visit | Attending: Physician Assistant | Admitting: Physician Assistant

## 2022-02-19 DIAGNOSIS — K810 Acute cholecystitis: Secondary | ICD-10-CM

## 2022-02-19 DIAGNOSIS — K8050 Calculus of bile duct without cholangitis or cholecystitis without obstruction: Secondary | ICD-10-CM | POA: Diagnosis not present

## 2022-02-19 DIAGNOSIS — Z434 Encounter for attention to other artificial openings of digestive tract: Secondary | ICD-10-CM | POA: Insufficient documentation

## 2022-02-19 HISTORY — PX: IR EXCHANGE BILIARY DRAIN: IMG6046

## 2022-02-19 MED ORDER — LIDOCAINE HCL 1 % IJ SOLN
INTRAMUSCULAR | Status: AC
  Administered 2022-02-19: 10 mL
  Filled 2022-02-19: qty 20

## 2022-02-19 MED ORDER — IOHEXOL 300 MG/ML  SOLN
50.0000 mL | Freq: Once | INTRAMUSCULAR | Status: AC | PRN
  Administered 2022-02-19: 15 mL

## 2022-02-19 NOTE — Procedures (Signed)
  Interventional Radiology Procedure Note  Procedure: FLUORO 12 FR CHOLECYSTOSTOMY    Complications: None  Estimated Blood Loss:  MIN  Findings: FULL REPORT IN PACS     Tamera Punt, MD

## 2022-02-20 DIAGNOSIS — D649 Anemia, unspecified: Secondary | ICD-10-CM | POA: Diagnosis not present

## 2022-02-20 DIAGNOSIS — Z8673 Personal history of transient ischemic attack (TIA), and cerebral infarction without residual deficits: Secondary | ICD-10-CM | POA: Diagnosis not present

## 2022-02-20 DIAGNOSIS — J9611 Chronic respiratory failure with hypoxia: Secondary | ICD-10-CM | POA: Diagnosis not present

## 2022-02-20 DIAGNOSIS — R339 Retention of urine, unspecified: Secondary | ICD-10-CM | POA: Diagnosis not present

## 2022-02-20 DIAGNOSIS — E669 Obesity, unspecified: Secondary | ICD-10-CM | POA: Diagnosis not present

## 2022-02-20 DIAGNOSIS — E119 Type 2 diabetes mellitus without complications: Secondary | ICD-10-CM | POA: Diagnosis not present

## 2022-02-20 DIAGNOSIS — Z87440 Personal history of urinary (tract) infections: Secondary | ICD-10-CM | POA: Diagnosis not present

## 2022-02-20 DIAGNOSIS — R69 Illness, unspecified: Secondary | ICD-10-CM | POA: Diagnosis not present

## 2022-02-20 DIAGNOSIS — M1990 Unspecified osteoarthritis, unspecified site: Secondary | ICD-10-CM | POA: Diagnosis not present

## 2022-02-20 DIAGNOSIS — E785 Hyperlipidemia, unspecified: Secondary | ICD-10-CM | POA: Diagnosis not present

## 2022-02-20 DIAGNOSIS — Z466 Encounter for fitting and adjustment of urinary device: Secondary | ICD-10-CM | POA: Diagnosis not present

## 2022-02-20 DIAGNOSIS — G0400 Acute disseminated encephalitis and encephalomyelitis, unspecified: Secondary | ICD-10-CM | POA: Diagnosis not present

## 2022-02-20 DIAGNOSIS — G35 Multiple sclerosis: Secondary | ICD-10-CM | POA: Diagnosis not present

## 2022-02-20 DIAGNOSIS — Z436 Encounter for attention to other artificial openings of urinary tract: Secondary | ICD-10-CM | POA: Diagnosis not present

## 2022-02-20 DIAGNOSIS — Z794 Long term (current) use of insulin: Secondary | ICD-10-CM | POA: Diagnosis not present

## 2022-02-20 DIAGNOSIS — Z9181 History of falling: Secondary | ICD-10-CM | POA: Diagnosis not present

## 2022-02-20 DIAGNOSIS — I10 Essential (primary) hypertension: Secondary | ICD-10-CM | POA: Diagnosis not present

## 2022-02-20 DIAGNOSIS — Z6834 Body mass index (BMI) 34.0-34.9, adult: Secondary | ICD-10-CM | POA: Diagnosis not present

## 2022-02-23 ENCOUNTER — Encounter: Payer: Self-pay | Admitting: Neurology

## 2022-02-23 ENCOUNTER — Telehealth: Payer: Medicare HMO | Admitting: Neurology

## 2022-02-23 DIAGNOSIS — Z8619 Personal history of other infectious and parasitic diseases: Secondary | ICD-10-CM | POA: Diagnosis not present

## 2022-02-23 DIAGNOSIS — R269 Unspecified abnormalities of gait and mobility: Secondary | ICD-10-CM

## 2022-02-23 DIAGNOSIS — G969 Disorder of central nervous system, unspecified: Secondary | ICD-10-CM

## 2022-02-23 DIAGNOSIS — G379 Demyelinating disease of central nervous system, unspecified: Secondary | ICD-10-CM

## 2022-02-23 DIAGNOSIS — N390 Urinary tract infection, site not specified: Secondary | ICD-10-CM

## 2022-02-23 NOTE — Progress Notes (Signed)
[facility]  PATIENT: Troy Randall DOB: 02-Jan-1961  REFERRING DOCTOR OR PCP:  Dr. Nevada Crane SOURCE: Patient, extensive notes, laboratory tests, imaging results, other results from long hospitalization.  Imaging studies were personally reviewed.  _________________________________   HISTORICAL  CHIEF COMPLAINT:  No chief complaint on file.   HISTORY OF PRESENT ILLNESS:  Troy Randall is a 62 y.o. man with CNS demyelinating disease and history of strokes  Virtual Visit via Video Note I connected with Troy Randall NAME@ on 02/23/22 at  3:00 PM EST by a video enabled telemedicine application and verified that I am speaking with the correct person.  I discussed the limitations of evaluation and management by telemedicine and the availability of in person appointments. The patient expressed understanding and agreed to proceed.  Patient at home; provider in office  Update 02/23/2022 For the most part, he has wife feels that he is doing about the same now as he did when I saw him 2 months ago.  He had 2 UTIs in last 6 weeks and had more confusion both times.  He has an indwelling catheters.   He gets a new one q4 weeks and just had a new one replaced.   Due to having several hernia repairs in past with mesh,  he is not a urostomy candidate.    He continues to have difficulty with gait.  He does PT in the house twice weekly.  He is able to take steps with a walker for short distances.  He continues to note weakness in the legs.  He denies numbness.  Due to urinary retention, he has a Foley catheter.  He has some cognitive impairments with reduced focus/attention and reduced processing.  Speech is slowed but no frank aphasia.  We discussed getting a repeat MRI of the brain to determine if there has been any subclinical progression.  Physical examination: He is a well-developed well-nourished woman in no acute distress.  The head is normocephalic and atraumatic.  Sclera  are anicteric.  Visible skin appears normal.  The neck has a good range of motion.  He e is alert and oriented.  Speech is slowed.  He answers questions appropriately.Marland Kitchen  Extraocular muscles are intact.  Facial strength is normal. Rapid alternating movements and finger-nose-finger are symmetric l.   History of neurologic events: He had had 2 strokes in the past, both affecting his speech.  The last stroke was in April 2023.  MRI 04/28/2021 had shown a small acute stroke in the subcortical white matter of the left frontal lobe.  Additionally there were several lacunar infarctions in the cerebral hemispheres and brainstem  He was neurologically baseline until early September 2023.  He had a normal morning on 09/04/2021 but noted GI bleed prompting him to go to the ED.   he had jejunal bleeding on colonoscopy treated with embolization.  He was noted to have altered mental status a couple days later while in the hospital.  He was found to have sepsis.   He became less responsive.  He was noted to have renal failure and due to respiratory failure was intubated.      CT scan 09/12/2021 showed interval development of multiple white matter foci compared to April 2023 and MRI of the brain was recommended for further evaluation.  MRI of the brain 09/20/2021 and 09/21/2021 showed development of multiple enhancing foci in the hemispheres.  The differential diagnosis was multiple strokes versus demyelination.  CSF evaluation 09/22/2021 showed normal cell  count and protein and cultures.  IgG index was normal.  Oligoclonal bands were negative (he did have matched bands in both the serum and CSF).  HIV was negative.  He received IV steroids followed by plasmapheresis .  Due to GI bleeds he received multiple blood transfusions while in the hospital.  Initially, he had been unresponsive but had regained consciousness with treatment of the sepsis.  He continues to have reduced mental status for several weeks and began to improve  towards the end of September, early October but remain mute for several more days.  He remained bedbound for several weeks and began to participate with PT more during October.  He was admitted to rehab on 10/30/2021.  He continues to do outpatient rehab.     Over the last month he has had multiple episodes of infection treated with antibiotics.  He is currently off of antibiotics.  He has improved physically and cognitively.   He can walk 50 feet with a walker now.  He communicates effectively and answers questions appropriately but cognition is not at baseline.  Imaging: MRI of the brain 09/20/2021 and 09/21/2021 showed multiple T2/FLAIR hyperintense foci in the hemispheres many of which enhanced after contrast.  Additional nonenhancing foci were noted in the cerebral hemispheres, left cerebellar hemisphere and left middle cerebellar peduncle.  All of the enhancing foci were new compared to the 04/28/2021 MRI and most of the nonenhancing foci were present on the previous MRI.  MRI of the brain 10/03/2021 showed multiple T2/FLAIR hyperintense foci in the cerebral hemispheres, cerebellum, left middle cerebellar peduncle, pons.  Some of the foci enhanced after contrast.  MRI of the cervical spine 10/03/2021 showed a normal spinal cord.  MRI of the thoracic spine 10/03/2021 showed subtle T2 hyperintense foci at T4-T5 and T6.  No definite enhancement noted.  Study was difficult to interpret due to movement artifact.  MRI of the brain 04/28/2021 showed several infratentorial and supratentorial lacunar infarctions.  There was an acute small stroke in the left frontal lobe.  Chronic lacunar infarctions were noted in the left middle cerebellar peduncle, inferior cerebellum and in the cerebral hemispheres, left greater than right  He has had additioanl UTIs since d/c and just finished a round of antibiotics.     He is doing PT and has made progress but other medical issues have led to temporary setback.     REVIEW  OF SYSTEMS: Constitutional: No fevers, chills, sweats, or change in appetite Eyes: No visual changes, double vision, eye pain Ear, nose and throat: No hearing loss, ear pain, nasal congestion, sore throat Cardiovascular: No chest pain, palpitations Respiratory:  No shortness of breath at rest or with exertion.   No wheezes GastrointestinaI: No nausea, vomiting, diarrhea, abdominal pain, fecal incontinence Genitourinary: Currently has Foley catheter musculoskeletal:  No neck pain, back pain Integumentary: No rash, pruritus, skin lesions Neurological: as above Psychiatric: No depression at this time.  No anxiety Endocrine: No palpitations, diaphoresis, change in appetite, change in weigh or increased thirst Hematologic/Lymphatic:  No anemia, purpura, petechiae. Allergic/Immunologic: No itchy/runny eyes, nasal congestion, recent allergic reactions, rashes  ALLERGIES: Allergies  Allergen Reactions   Metformin And Related     GI upset    HOME MEDICATIONS:  Current Outpatient Medications:    acetaminophen (TYLENOL) 325 MG tablet, Take 1-2 tablets (325-650 mg total) by mouth every 4 (four) hours as needed for mild pain., Disp: , Rfl:    BD VEO INSULIN SYRINGE U/F 31G X 15/64" 1  ML MISC,  USE AS DIRECTED, Disp: 100 each, Rfl: 2   glucose blood (ONETOUCH VERIO) test strip, TEST twice a day, Disp: 100 each, Rfl: 3   Insulin Pen Needle (NOVOTWIST) 32G X 5 MM MISC, Use two daily to inject Victoza and Toujeo., Disp: 90 each, Rfl: 5   methocarbamol (ROBAXIN) 500 MG tablet, Take 1 tablet (500 mg total) by mouth every 6 (six) hours as needed for muscle spasms., Disp: 30 tablet, Rfl: 0   metoprolol tartrate (LOPRESSOR) 25 MG tablet, Take 1 tablet (25 mg total) by mouth 2 (two) times daily., Disp: 60 tablet, Rfl: 0   ONETOUCH DELICA LANCETS 99991111 MISC, Use to check blood sugar once a day dx code E11.65, Disp: 50 each, Rfl: 3   pantoprazole (PROTONIX) 40 MG tablet, Take 1 tablet (40 mg total) by mouth  daily., Disp: 30 tablet, Rfl: 0   rosuvastatin (CRESTOR) 40 MG tablet, Take 1 tablet (40 mg total) by mouth daily., Disp: 30 tablet, Rfl: 1   tamsulosin (FLOMAX) 0.4 MG CAPS capsule, Take 1 capsule (0.4 mg total) by mouth daily after supper., Disp: 30 capsule, Rfl: 0   traZODone (DESYREL) 50 MG tablet, Take 0.5-1 tablets (25-50 mg total) by mouth at bedtime as needed for sleep., Disp: , Rfl:    TRESIBA FLEXTOUCH 200 UNIT/ML FlexTouch Pen, Inject under skin 15 units twice a day., Disp: 3 mL, Rfl: 0  PAST MEDICAL HISTORY: Past Medical History:  Diagnosis Date   Anemia    Arthritis    CVA (cerebral vascular accident) ([place])    Diabetes mellitus without complication ([place])    Hyperlipidemia    Hypertension    Kidney stones     PAST SURGICAL HISTORY: Past Surgical History:  Procedure Laterality Date   ANKLE SURGERY     BIOPSY  09/10/2021   Procedure: BIOPSY;  Surgeon: Carol Ada, MD;  Location: [facility] ENDOSCOPY;  Service: Gastroenterology;;   BIOPSY  10/07/2021   Procedure: BIOPSY;  Surgeon: Carol Ada, MD;  Location: [facility] ENDOSCOPY;  Service: Gastroenterology;;   COLONOSCOPY N/A 10/07/2021   Procedure: COLONOSCOPY;  Surgeon: Carol Ada, MD;  Location: [place];  Service: Gastroenterology;  Laterality: N/A;   COLONOSCOPY WITH PROPOFOL N/A 09/06/2021   Procedure: COLONOSCOPY WITH PROPOFOL;  Surgeon: Harvel Quale, MD;  [facility];  Service: Gastroenterology;  Laterality: N/A;   ENTEROSCOPY N/A 09/10/2021   Procedure: ENTEROSCOPY;  Surgeon: Carol Ada, MD;  Location: [place];  Service: Gastroenterology;  Laterality: N/A;   ENTEROSCOPY  09/06/2021   Procedure: ENTEROSCOPY;  Surgeon: Harvel Quale, MD;  Location: [facility];  Service: Gastroenterology;;   ESOPHAGOGASTRODUODENOSCOPY (EGD) WITH PROPOFOL  09/06/2021   Procedure: ESOPHAGOGASTRODUODENOSCOPY (EGD) WITH PROPOFOL;  Surgeon: Harvel Quale, MD;  Location: [facility];  Service:  Gastroenterology;;   [place] N/A 09/19/2021   Procedure: Beryle Quant;  Surgeon: Carol Ada, MD;  Location: [place];  Service: Gastroenterology;  Laterality: N/A;   FOREIGN BODY REMOVAL  09/19/2021   Procedure: FOREIGN BODY REMOVAL;  Surgeon: Carol Ada, MD;  Location: [facility] ENDOSCOPY;  Service: Gastroenterology;;   Freda Munro CAPSULE STUDY  09/06/2021   Procedure: GIVENS CAPSULE STUDY;  Surgeon: Harvel Quale, MD;  [facility];  Service: Gastroenterology;;   HERNIA REPAIR     umbilical x1 Incisional x1   INCISIONAL HERNIA REPAIR  04/13/2011   Procedure: LAPAROSCOPIC INCISIONAL HERNIA;  Surgeon: Jamesetta So, MD;  Location: [facility];  Service: General;  Laterality: N/A;  Recurrent Laparoscopic Incisional Herniorraphy with Mesh   IR ANGIOGRAM SELECTIVE EACH ADDITIONAL VESSEL  09/09/2021   IR ANGIOGRAM VISCERAL SELECTIVE  09/07/2021   IR ANGIOGRAM VISCERAL SELECTIVE  09/06/2021   IR ANGIOGRAM VISCERAL SELECTIVE  09/06/2021   IR CHOLANGIOGRAM EXISTING TUBE  11/06/2021   IR EMBO ART  VEN HEMORR LYMPH EXTRAV  INC GUIDE ROADMAPPING  09/06/2021   IR EXCHANGE BILIARY DRAIN  10/24/2021   IR EXCHANGE BILIARY DRAIN  11/19/2021   IR EXCHANGE BILIARY DRAIN  01/19/2022   IR EXCHANGE BILIARY DRAIN  02/19/2022   IR GUIDED DRAIN W CATHETER PLACEMENT  09/07/2021   IR US GUIDE BX ASP/DRAIN  09/07/2021   IR US GUIDE VASC ACCESS RIGHT  09/07/2021   IR US GUIDE VASC ACCESS RIGHT  09/06/2021   KIDNEY STONE SURGERY      FAMILY HISTORY: Family History  Problem Relation Age of Onset   Heart failure Mother    Diabetes Father    Cancer Other    Heart attack Other    Anesthesia problems Neg Hx    Hypotension Neg Hx    Malignant hyperthermia Neg Hx    Pseudochol deficiency Neg Hx    Colon cancer Neg Hx    Inflammatory bowel disease Neg Hx     SOCIAL HISTORY: Social History   Socioeconomic History   Marital status: Married    Spouse name: Not on file   Number of children:  Not on file   Years of education: Not on file   Highest education level: Not on file  Occupational History   Not on file  Tobacco Use   Smoking status: Never   Smokeless tobacco: Current    Types: Snuff  Vaping Use   Vaping Use: Never used  Substance and Sexual Activity   Alcohol use: No   Drug use: No   Sexual activity: Yes    Birth control/protection: None  Other Topics Concern   Not on file  Social History Narrative   Live in [place], [place].   Married for over 30 years.   Has 2 children, age 36/30s. Has one grandchild, 83 months.    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    Eats all food groups.   Wears seatbelt.    Dip 1 can a day.    Social Determinants of Health   Financial Resource Strain: Not on file  Food Insecurity: No Food Insecurity (09/29/2021)   Hunger Vital Sign    Worried About Running Out of Food in the Last Year: Never true    Ran Out of Food in the Last Year: Never true  Transportation Needs: No Transportation Needs (09/29/2021)   PRAPARE - Hydrologist (Medical): No    Lack of Transportation (Non-Medical): No  Physical Activity: Not on file  Stress: Not on file  Social Connections: Not on file  Intimate Partner Violence: Not At Risk (09/29/2021)   Humiliation, Afraid, Rape, and Kick questionnaire    Fear of Current or Ex-Partner: No    Emotionally Abused: No    Physically Abused: No    Sexually Abused: No       PHYSICAL EXAM from 12/11/2022 evaluation  There were no vitals filed for this visit.   There is no height or weight on file to calculate BMI.   General: The patient is well-developed and well-nourished and in no acute distress.  He is in a wheelchair.  He has bile drainage and a Foley catheter  HEENT:  Head is [place]/AT.  Sclera are anicteric.  Funduscopic exam shows normal optic discs and retinal vessels.  Neck: No carotid bruits are noted.  The neck is  nontender.  Cardiovascular: The heart has a regular rate and rhythm with a normal S1 and S2. There were no murmurs, gallops or rubs.    Skin: He has mild edema  Musculoskeletal:  Back is nontender  Neurologic Exam  Mental status: The patient is alert and answers most questions appropriately.  Focus/attention is reduced.  Speech is slowed but no definite aphasia.  Cranial nerves: Extraocular movements are full. Pupils are equal, round, and reactive to light and accomodation.  Visual fields are full.  Facial symmetry is present. There is good facial sensation to soft touch bilaterally.Facial strength is normal.  Trapezius and sternocleidomastoid strength is normal. No dysarthria is noted.  The tongue is midline, and the patient has symmetric elevation of the soft palate. No obvious hearing deficits are noted.  Motor:  Muscle bulk is normal.   Tone is normal. Strength is  5 / 5 in all 4 extremities in the arms, 4+/5 in legs.  Sensory: Sensory testing is intact to pinprick, soft touch and vibration sensation in all 4 extremities.  Coordination: Cerebellar testing reveals good finger-nose-finger and mildly reduced heel-to-shin bilaterally.  Gait and station: Although he uses his arms when he stands up he does not need support from others to do so.  With support he can take a few steps  Reflexes: Deep tendon reflexes are symmetric and normal bilaterally.       DIAGNOSTIC DATA (LABS, IMAGING, TESTING) - I reviewed patient records, labs, notes, testing and imaging myself where available.  Lab Results  Component Value Date   WBC 4.4 11/24/2021   HGB 10.2 (L) 11/24/2021   HCT 31.5 (L) 11/24/2021   MCV 85.8 11/24/2021   PLT 183 11/24/2021      Component Value Date/Time   NA 142 11/24/2021 0509   K 3.2 (L) 11/26/2021 0813   CL 106 11/24/2021 0509   CO2 24 11/24/2021 0509   GLUCOSE 100 (H) 11/24/2021 0509   BUN 6 11/24/2021 0509   CREATININE 0.62 11/24/2021 0509   CREATININE 0.90  03/31/2017 1625   CALCIUM 8.7 (L) 11/24/2021 0509   PROT 5.5 (L) 11/24/2021 0509   ALBUMIN 2.7 (L) 11/24/2021 0509   ALBUMIN 1.9 (L) 09/22/2021 1348   AST 21 11/24/2021 0509   ALT 25 11/24/2021 0509   ALKPHOS 62 11/24/2021 0509   BILITOT 0.5 11/24/2021 0509   GFRNONAA >60 11/24/2021 0509   GFRAA >90 04/07/2011 0927   Lab Results  Component Value Date   CHOL 117 04/29/2021   HDL 32 (L) 04/29/2021   LDLCALC 66 04/29/2021   LDLDIRECT 101.0 01/21/2016   TRIG 93 04/29/2021   CHOLHDL 3.7 04/29/2021   Lab Results  Component Value Date   HGBA1C 5.1 10/27/2021   Lab Results  Component Value Date   VITAMINB12 1,427 (H) 09/22/2021   Lab Results  Component Value Date   TSH 1.057 11/07/2021       ASSESSMENT AND PLAN  Demyelinating disease (HCC)  Encephalomyelopathy  Gait disturbance  History of sepsis  Urinary tract infection without hematuria, site unspecified   MRI of the brain under anesthesia will be performed 03/10/2022 at [facility] We will compare to previous MRI to determine if progression or other change.   If there has been further  progression we would need to reconsider MS, though with a normal CSF this is not likely. He will continue physical therapy.  He is currently using a walker and if he continues to improve will hopefully be able to get to a cane or even walk independently again.  He should stay active. He has appt with me after the MRI and family should call sooner if there are new or worsening neurologic symptoms.   Follow Up Instructions: I discussed the assessment and treatment plan with the patient. The patient was provided an opportunity to ask questions and all were answered. The patient agreed with the plan and demonstrated an understanding of the instructions.    The patient was advised to call back or seek an in-person evaluation if the symptoms worsen or if the condition fails to improve as anticipated.  I provided 25 minutes of  non-face-to-face time during this encounter.   Sony Schlarb A. Felecia Shelling, MD, [facility] [id number], [id number] PM Certified in Neurology, Clinical Neurophysiology, Sleep Medicine and Neuroimaging  [facility] [address] [place], [place] [address] [phone]

## 2022-02-24 DIAGNOSIS — J9611 Chronic respiratory failure with hypoxia: Secondary | ICD-10-CM | POA: Diagnosis not present

## 2022-02-24 DIAGNOSIS — G0400 Acute disseminated encephalitis and encephalomyelitis, unspecified: Secondary | ICD-10-CM | POA: Diagnosis not present

## 2022-02-24 DIAGNOSIS — G8929 Other chronic pain: Secondary | ICD-10-CM | POA: Diagnosis not present

## 2022-02-24 DIAGNOSIS — M5440 Lumbago with sciatica, unspecified side: Secondary | ICD-10-CM | POA: Diagnosis not present

## 2022-02-24 DIAGNOSIS — R5381 Other malaise: Secondary | ICD-10-CM | POA: Diagnosis not present

## 2022-02-25 DIAGNOSIS — Z6834 Body mass index (BMI) 34.0-34.9, adult: Secondary | ICD-10-CM | POA: Diagnosis not present

## 2022-02-25 DIAGNOSIS — Z436 Encounter for attention to other artificial openings of urinary tract: Secondary | ICD-10-CM | POA: Diagnosis not present

## 2022-02-25 DIAGNOSIS — R339 Retention of urine, unspecified: Secondary | ICD-10-CM | POA: Diagnosis not present

## 2022-02-25 DIAGNOSIS — Z87440 Personal history of urinary (tract) infections: Secondary | ICD-10-CM | POA: Diagnosis not present

## 2022-02-25 DIAGNOSIS — M1990 Unspecified osteoarthritis, unspecified site: Secondary | ICD-10-CM | POA: Diagnosis not present

## 2022-02-25 DIAGNOSIS — Z9181 History of falling: Secondary | ICD-10-CM | POA: Diagnosis not present

## 2022-02-25 DIAGNOSIS — R69 Illness, unspecified: Secondary | ICD-10-CM | POA: Diagnosis not present

## 2022-02-25 DIAGNOSIS — G0400 Acute disseminated encephalitis and encephalomyelitis, unspecified: Secondary | ICD-10-CM | POA: Diagnosis not present

## 2022-02-25 DIAGNOSIS — Z466 Encounter for fitting and adjustment of urinary device: Secondary | ICD-10-CM | POA: Diagnosis not present

## 2022-02-25 DIAGNOSIS — G35 Multiple sclerosis: Secondary | ICD-10-CM | POA: Diagnosis not present

## 2022-02-25 DIAGNOSIS — D649 Anemia, unspecified: Secondary | ICD-10-CM | POA: Diagnosis not present

## 2022-02-25 DIAGNOSIS — E669 Obesity, unspecified: Secondary | ICD-10-CM | POA: Diagnosis not present

## 2022-02-25 DIAGNOSIS — J9611 Chronic respiratory failure with hypoxia: Secondary | ICD-10-CM | POA: Diagnosis not present

## 2022-02-25 DIAGNOSIS — Z8673 Personal history of transient ischemic attack (TIA), and cerebral infarction without residual deficits: Secondary | ICD-10-CM | POA: Diagnosis not present

## 2022-02-25 DIAGNOSIS — E785 Hyperlipidemia, unspecified: Secondary | ICD-10-CM | POA: Diagnosis not present

## 2022-02-25 DIAGNOSIS — E119 Type 2 diabetes mellitus without complications: Secondary | ICD-10-CM | POA: Diagnosis not present

## 2022-02-25 DIAGNOSIS — Z794 Long term (current) use of insulin: Secondary | ICD-10-CM | POA: Diagnosis not present

## 2022-02-25 DIAGNOSIS — I10 Essential (primary) hypertension: Secondary | ICD-10-CM | POA: Diagnosis not present

## 2022-02-26 DIAGNOSIS — R5381 Other malaise: Secondary | ICD-10-CM | POA: Diagnosis not present

## 2022-02-26 DIAGNOSIS — M5440 Lumbago with sciatica, unspecified side: Secondary | ICD-10-CM | POA: Diagnosis not present

## 2022-02-26 DIAGNOSIS — J9611 Chronic respiratory failure with hypoxia: Secondary | ICD-10-CM | POA: Diagnosis not present

## 2022-02-26 DIAGNOSIS — G8929 Other chronic pain: Secondary | ICD-10-CM | POA: Diagnosis not present

## 2022-02-26 DIAGNOSIS — G0400 Acute disseminated encephalitis and encephalomyelitis, unspecified: Secondary | ICD-10-CM | POA: Diagnosis not present

## 2022-03-04 DIAGNOSIS — Z8673 Personal history of transient ischemic attack (TIA), and cerebral infarction without residual deficits: Secondary | ICD-10-CM | POA: Diagnosis not present

## 2022-03-04 DIAGNOSIS — F419 Anxiety disorder, unspecified: Secondary | ICD-10-CM | POA: Diagnosis not present

## 2022-03-04 DIAGNOSIS — G35 Multiple sclerosis: Secondary | ICD-10-CM | POA: Diagnosis not present

## 2022-03-04 DIAGNOSIS — M1990 Unspecified osteoarthritis, unspecified site: Secondary | ICD-10-CM | POA: Diagnosis not present

## 2022-03-04 DIAGNOSIS — Z9181 History of falling: Secondary | ICD-10-CM | POA: Diagnosis not present

## 2022-03-04 DIAGNOSIS — E785 Hyperlipidemia, unspecified: Secondary | ICD-10-CM | POA: Diagnosis not present

## 2022-03-04 DIAGNOSIS — Z466 Encounter for fitting and adjustment of urinary device: Secondary | ICD-10-CM | POA: Diagnosis not present

## 2022-03-04 DIAGNOSIS — Z436 Encounter for attention to other artificial openings of urinary tract: Secondary | ICD-10-CM | POA: Diagnosis not present

## 2022-03-04 DIAGNOSIS — Z6834 Body mass index (BMI) 34.0-34.9, adult: Secondary | ICD-10-CM | POA: Diagnosis not present

## 2022-03-04 DIAGNOSIS — Z794 Long term (current) use of insulin: Secondary | ICD-10-CM | POA: Diagnosis not present

## 2022-03-04 DIAGNOSIS — Z87440 Personal history of urinary (tract) infections: Secondary | ICD-10-CM | POA: Diagnosis not present

## 2022-03-04 DIAGNOSIS — D649 Anemia, unspecified: Secondary | ICD-10-CM | POA: Diagnosis not present

## 2022-03-04 DIAGNOSIS — E119 Type 2 diabetes mellitus without complications: Secondary | ICD-10-CM | POA: Diagnosis not present

## 2022-03-04 DIAGNOSIS — I10 Essential (primary) hypertension: Secondary | ICD-10-CM | POA: Diagnosis not present

## 2022-03-04 DIAGNOSIS — E669 Obesity, unspecified: Secondary | ICD-10-CM | POA: Diagnosis not present

## 2022-03-04 DIAGNOSIS — J9611 Chronic respiratory failure with hypoxia: Secondary | ICD-10-CM | POA: Diagnosis not present

## 2022-03-04 DIAGNOSIS — R339 Retention of urine, unspecified: Secondary | ICD-10-CM | POA: Diagnosis not present

## 2022-03-04 DIAGNOSIS — G0400 Acute disseminated encephalitis and encephalomyelitis, unspecified: Secondary | ICD-10-CM | POA: Diagnosis not present

## 2022-03-05 DIAGNOSIS — G0400 Acute disseminated encephalitis and encephalomyelitis, unspecified: Secondary | ICD-10-CM | POA: Diagnosis not present

## 2022-03-05 DIAGNOSIS — G35 Multiple sclerosis: Secondary | ICD-10-CM | POA: Diagnosis not present

## 2022-03-05 DIAGNOSIS — M1990 Unspecified osteoarthritis, unspecified site: Secondary | ICD-10-CM | POA: Diagnosis not present

## 2022-03-05 DIAGNOSIS — Z87440 Personal history of urinary (tract) infections: Secondary | ICD-10-CM | POA: Diagnosis not present

## 2022-03-05 DIAGNOSIS — Z9181 History of falling: Secondary | ICD-10-CM | POA: Diagnosis not present

## 2022-03-05 DIAGNOSIS — D649 Anemia, unspecified: Secondary | ICD-10-CM | POA: Diagnosis not present

## 2022-03-05 DIAGNOSIS — R339 Retention of urine, unspecified: Secondary | ICD-10-CM | POA: Diagnosis not present

## 2022-03-05 DIAGNOSIS — Z466 Encounter for fitting and adjustment of urinary device: Secondary | ICD-10-CM | POA: Diagnosis not present

## 2022-03-05 DIAGNOSIS — E785 Hyperlipidemia, unspecified: Secondary | ICD-10-CM | POA: Diagnosis not present

## 2022-03-05 DIAGNOSIS — Z6834 Body mass index (BMI) 34.0-34.9, adult: Secondary | ICD-10-CM | POA: Diagnosis not present

## 2022-03-05 DIAGNOSIS — Z8673 Personal history of transient ischemic attack (TIA), and cerebral infarction without residual deficits: Secondary | ICD-10-CM | POA: Diagnosis not present

## 2022-03-05 DIAGNOSIS — Z436 Encounter for attention to other artificial openings of urinary tract: Secondary | ICD-10-CM | POA: Diagnosis not present

## 2022-03-05 DIAGNOSIS — J9611 Chronic respiratory failure with hypoxia: Secondary | ICD-10-CM | POA: Diagnosis not present

## 2022-03-05 DIAGNOSIS — E669 Obesity, unspecified: Secondary | ICD-10-CM | POA: Diagnosis not present

## 2022-03-05 DIAGNOSIS — E119 Type 2 diabetes mellitus without complications: Secondary | ICD-10-CM | POA: Diagnosis not present

## 2022-03-05 DIAGNOSIS — Z794 Long term (current) use of insulin: Secondary | ICD-10-CM | POA: Diagnosis not present

## 2022-03-05 DIAGNOSIS — F419 Anxiety disorder, unspecified: Secondary | ICD-10-CM | POA: Diagnosis not present

## 2022-03-05 DIAGNOSIS — I10 Essential (primary) hypertension: Secondary | ICD-10-CM | POA: Diagnosis not present

## 2022-03-06 ENCOUNTER — Ambulatory Visit (HOSPITAL_COMMUNITY): Payer: Medicare HMO | Admitting: Vascular Surgery

## 2022-03-06 ENCOUNTER — Other Ambulatory Visit: Payer: Self-pay | Admitting: Neurology

## 2022-03-06 ENCOUNTER — Other Ambulatory Visit: Payer: Self-pay

## 2022-03-06 ENCOUNTER — Encounter (HOSPITAL_COMMUNITY): Payer: Self-pay | Admitting: *Deleted

## 2022-03-06 DIAGNOSIS — G379 Demyelinating disease of central nervous system, unspecified: Secondary | ICD-10-CM

## 2022-03-06 NOTE — Progress Notes (Signed)
Anesthesia Chart Review: Troy Randall  Case: [id number] Date/Time: 03/10/22 0945   Procedure: MRI WITH ANESTHESIA OF BRAIN WITH AND WITHOUT CONTRAST   Anesthesia type: General   Pre-op diagnosis: DEMYELINATING   Location: [facility] OR RADIOLOGY ROOM / [place]   Surgeons: Radiologist, Medication, MD       DISCUSSION: Patient is a 62 year old male scheduled the above procedure. MRI was ordered by Dr. Felecia Shelling. H&P on 02/23/22. As outlined below, prolonged hospitalization and rehab from 09/04/21-11/26/21. During hospitalization, demyelinating disease note on brain MRI. Repeat imaging recommended. 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  [place] admission 09/04/21-10/30/21. Previously admitted 04/29/21 for CVA. Per Discharge Summary, he presented 09/04/21 with frequent dark stools. GI consulted. CTA 09/05/21 did not show active GI bleed. S/p 2 units PRBC 09/05/21 and 09/06/21. 09/06/21 colonoscopy showed no active bleed but large clots. EGD showed small hiatal hernia, erosive gastropathy with no stigmata of recent bleeding, non-bleeding duodenal diverticulum. On small bowel enteroscopy noted intermittent blood pumping in the proximal jejunum but unable to be reached despite multiple attempts but injected and clipped paced. CTA repeated and showed jejunal diverticular bleed. IR was able to treat with coil embolization. 09/07/21 he was intubated. CTA showed no active bleeding but new finding of abnormal  gall bladder, s/p percutaneous cholecystomy tube by IR for E. coli cholecystitis. He developed AKI and CRRT initiated 09/09/21-09/12/21. S/p percutaneous tracheostomy 09/15/21. 09/20/21 MRI brain for persistent confusion showed rapid development of numerous round white matter lesions in both cerebral hemispheres.  Repeat MRI 09/21/21 showed numerous contrast-enhancing bilateral white matter lesions, consistent with active demyelination but limited exam. S/p LP 09/22/21. No definite active demyelinization on 09/23/21 MRI cervical and thoracic spine. He received IV steroids but clinical exam not improved so underwent plasmapheresis for acute disseminated encephalomyelitis and demyelinating disease. Discharged to [facility] 10/30/21-11/06/21 for ongoing rehab therapies, but re-admitted 11/06/21 - 11/12/21 for tachycardia/SVT and bacteremia (blood cultures positive for E. Coli and urine for Klebsiella pneumoniae (in setting of recurrent urinary retention). He converted to SR, on metoprolol. Echo updated 11/07/21 and showed LVEF 70-75%, no regional wall motion abnormalities, normal RV systolic function, trivial MR, mild AI, ascending aorta 42 mm. He was admitted back to [facility] 11/12/21-11/26/21. He was discharged with foley catheter and cholecystostomy drain and follow-up with multiple specialists including neurology, urology, IR, general surgery.    He has a cholecystostomy drain for chronic cholecystitis. Given ongoing recovery from CVA, cholecystectomy has not been pursued.  He does have common bile duct stones, so open cholecystectomy with ERCP may be considered in the future.  He is followed by Dr. Brantley Stage with general surgery, last visit 12/15/21.  Last biliary drain exchange by IR was on 02/19/2022.  Urologist Dr. Diona Fanti suspected urinary tension was most likely due to his significant neurologic insult.  Patient has an dwelling catheter that is being changed every 4 weeks.  He did not advise treating urinary tract infections unless  his bladder becomes symptomatic such as bladder pain.    Anesthesia team to evaluate on the day of procedure.  VS:  BP Readings from Last 3 Encounters:  01/08/22 102/73  12/10/21 107/65  12/09/21 (!) 97/57   Pulse Readings from Last 3 Encounters:  01/08/22 88  12/10/21 68  12/09/21 71     PROVIDERS: [name], MD is PCP  Arlice Colt, MD is neurologist  Erroll Luna, MD is general surgeon Franchot Gallo, MD is urologist Alysia Penna, MD is PM&R   LABS: Labs as indicated on the day of procedure. Last results in [facility] include: Lab Results  Component Value Date   WBC 4.4 11/24/2021   HGB 10.2 (L) 11/24/2021   HCT 31.5 (L) 11/24/2021   PLT 183 11/24/2021   GLUCOSE 100 (H) 11/24/2021   ALT 25 11/24/2021   AST 21 11/24/2021   NA 142 11/24/2021   K 3.2 (L) 11/26/2021   CL 106 11/24/2021   CREATININE 0.62 11/24/2021   BUN 6 11/24/2021   CO2 24 11/24/2021   TSH 1.057 11/07/2021   INR 1.2 11/07/2021   HGBA1C 5.1 10/27/2021    IMAGES: CTA Abd/pelvis 11/08/21: IMPRESSION: 1. Cholecystostomy tube decompresses the gallbladder. No intra or extrahepatic biliary ductal dilatation. 2. Patchy soft tissue density in the anterior omental fat may represent omental infarct. 3. Colonic diverticulosis without focal diverticulitis. 4. Bilateral nonobstructing nephrolithiasis. 5. Trace bilateral pleural effusions.  1V PCXR 11/06/21: FINDINGS: The heart size and mediastinal contours are within normal limits. Both lungs are clear. The visualized skeletal structures are unremarkable. IMPRESSION: No active disease.    EKG: 11/07/21: Sinus tachycardia at 111 bpm Otherwise normal ECG When compared with ECG of 06-Nov-2021 18:37, Rate slower Confirmed by Buford Dresser [phone]) on 11/09/2021 10:01:01 PM  CV: Echo (limited) 11/07/21: IMPRESSIONS   1. Left ventricular ejection fraction, by estimation, is 70 to 75%. The  left ventricle has hyperdynamic  function. The left ventricle has no  regional wall motion abnormalities. There is moderate concentric left  ventricular hypertrophy.   2. Right ventricular systolic function is normal. The right ventricular  size is normal.   3. The mitral valve is normal in structure. Trivial mitral valve  regurgitation. No evidence of mitral stenosis.   4. The aortic valve is tricuspid. Aortic valve regurgitation is mild.  Aortic valve sclerosis/calcification is present, without any evidence of  aortic stenosis.   5. Aortic dilatation noted. There is mild dilatation of the ascending  aorta, measuring 42 mm.   Echo (Complete) 09/09/21: IMPRESSIONS   1. Left ventricular ejection fraction, by estimation, is 65 to 70%. The  left ventricle has hyperdynamic function. The left ventricle has no  regional wall motion abnormalities. Left ventricular diastolic parameters  are indeterminate.   2. Right ventricular systolic function is normal. The right ventricular  size is normal. Tricuspid regurgitation signal is inadequate for assessing  PA pressure.   3. The mitral valve is normal in structure. No evidence of mitral valve  regurgitation.   4. The aortic valve is tricuspid. There is mild calcification of the  aortic valve. There is mild thickening of the aortic valve. Aortic valve  regurgitation is not visualized. Aortic valve sclerosis is present, with  no evidence of aortic valve stenosis.  - Comparison(s): No significant change from prior study. Prior images reviewed side by side.   US Carotid 04/29/21: IMPRESSION: Right ICA narrowing less than 50% Left ICA stenosis estimated at 50-69% Normal antegrade vertebral flow bilaterally   Past Medical History:  Diagnosis Date   Anemia    Arthritis    CVA (cerebral vascular  accident) [facility])    last CVA 04/2021   Diabetes mellitus without complication ([place])    type 2   Hyperlipidemia    Hypertension    Kidney stones     Past Surgical History:   Procedure Laterality Date   ANKLE SURGERY     BIOPSY  09/10/2021   Procedure: BIOPSY;  Surgeon: Carol Ada, MD;  Location: [facility] ENDOSCOPY;  Service: Gastroenterology;;   BIOPSY  10/07/2021   Procedure: BIOPSY;  Surgeon: Carol Ada, MD;  Location: [place];  Service: Gastroenterology;;   COLONOSCOPY N/A 10/07/2021   Procedure: COLONOSCOPY;  Surgeon: Carol Ada, MD;  Location: [place];  Service: Gastroenterology;  Laterality: N/A;   COLONOSCOPY WITH PROPOFOL N/A 09/06/2021   Procedure: COLONOSCOPY WITH PROPOFOL;  Surgeon: Harvel Quale, MD;  Location[facility] ENDO SU[facility];  Service: Gastroenterology;  Laterality: N/A;   ENTEROSCOPY N/A 09/10/2021   Procedure: ENTEROSCOPY;  Surgeon: Carol Ada, MD;  Location: [place];  Service: Gastroenterology;  Laterality: N/A;   ENTEROSCOPY  09/06/2021   Procedure: ENTEROSCOPY;  Surgeon: Harvel Quale, MD;  Location: [facility] END[facility];  Service: Gastroenterology;;   ESOPHAGOGASTRODUODENOSCOPY (EGD) WITH PROPOFOL  09/06/2021   Procedure: ESOPHAGOGASTRODUODENOSCOPY (EGD) WITH PROPOFOL;  Surgeon: Harvel Quale, MD;  Location[facility] END[facility];  Service: Gastroenterology;;   [place] N/A 09/19/2021   Procedure: Beryle Quant;  Surgeon: Carol Ada, MD;  Location: [place];  Service: Gastroenterology;  Laterality: N/A;   FOREIGN BODY REMOVAL  09/19/2021   Procedure: FOREIGN BODY REMOVAL;  Surgeon: Carol Ada, MD;  Location: [facility] ENDOSCOPY;  Service: Gastroenterology;;   Freda Munro CAPSULE STUDY  09/06/2021   Procedure: GIVENS CAPSULE STUDY;  Surgeon: Harvel Quale, MD;  Location[facility];  Service: Gastroenterology;;   HERNIA REPAIR     umbilical x1 Incisional x1   INCISIONAL HERNIA REPAIR  04/13/2011   Procedure: LAPAROSCOPIC INCISIONAL HERNIA;  Surgeon: Jamesetta So, MD;  Location: [facility] ORS;  Service: General;  Laterality: N/A;  Recurrent Laparoscopic Incisional Herniorraphy with Mesh    IR ANGIOGRAM SELECTIVE EACH ADDITIONAL VESSEL  09/09/2021   IR ANGIOGRAM VISCERAL SELECTIVE  09/07/2021   IR ANGIOGRAM VISCERAL SELECTIVE  09/06/2021   IR ANGIOGRAM VISCERAL SELECTIVE  09/06/2021   IR CHOLANGIOGRAM EXISTING TUBE  11/06/2021   IR EMBO ART  VEN HEMORR LYMPH EXTRAV  INC GUIDE ROADMAPPING  09/06/2021   IR EXCHANGE BILIARY DRAIN  10/24/2021   IR EXCHANGE BILIARY DRAIN  11/19/2021   IR EXCHANGE BILIARY DRAIN  01/19/2022   IR EXCHANGE BILIARY DRAIN  02/19/2022   IR GUIDED DRAIN W CATHETER PLACEMENT  09/07/2021   IR US GUIDE BX ASP/DRAIN  09/07/2021   IR US GUIDE VASC ACCESS RIGHT  09/07/2021   IR US GUIDE VASC ACCESS RIGHT  09/06/2021   KIDNEY STONE SURGERY      MEDICATIONS: No current facility-administered medications for this encounter.    acetaminophen (TYLENOL) 325 MG tablet   ascorbic acid (VITAMIN C) 500 MG tablet   Finerenone (KERENDIA) 20 MG TABS   methocarbamol (ROBAXIN) 500 MG tablet   metoprolol tartrate (LOPRESSOR) 25 MG tablet   pantoprazole (PROTONIX) 40 MG tablet   Probiotic Product (PROBIOTIC PO)   rosuvastatin (CRESTOR) 40 MG tablet   traZODone (DESYREL) 50 MG tablet   TRESIBA FLEXTOUCH 200 UNIT/ML FlexTouch Pen   BD VEO INSULIN SYRINGE U/F 31G X 15/64" 1 ML MISC   glucose blood (ONETOUCH VERIO) test strip   Insulin Pen Needle (NOVOTWIST) 32G X 5  MM MISC   ONETOUCH DELICA LANCETS [id number] MISC   tamsulosin (FLOMAX) 0.4 MG CAPS capsule    Myra Gianotti, PA-C Surgical Short Stay/Anesthesiology [facility] Phone [phone] [facility] Phone [phone] 03/06/2022 7:52 PM

## 2022-03-06 NOTE — Anesthesia Preprocedure Evaluation (Signed)
Anesthesia Evaluation    Airway        Dental   Pulmonary           Cardiovascular hypertension,      Neuro/Psych    GI/Hepatic   Endo/Other  diabetes    Renal/GU      Musculoskeletal   Abdominal   Peds  Hematology   Anesthesia Other Findings   Reproductive/Obstetrics                             Anesthesia Physical Anesthesia Plan  ASA:   Anesthesia Plan:    Post-op Pain Management:    Induction:   PONV Risk Score and Plan:   Airway Management Planned:   Additional Equipment:   Intra-op Plan:   Post-operative Plan:   Informed Consent:   Plan Discussed with:   Anesthesia Plan Comments: (PAT note written 03/06/2022 by Myra Gianotti, PA-C.  )       Anesthesia Quick Evaluation

## 2022-03-06 NOTE — Pre-Procedure Instructions (Signed)
PCP - Dr.Hall Cardiologist - pt denies Neurologist-Dr.Richard Sater   PPM/ICD - pt denies Device Orders - n/a Rep Notified - n/a Clamp and Coil in his small intestine.   EKG - 11/07/21 Stress Test - pt denies ECHO - 11/07/21 Cardiac Cath - pt denies  Sleep Study/CPAP - pt denies  Diabetic- type 2 Fasting Blood Sugar - 109-130's Checks Blood Sugar 2 times a day  Last dose of GLP1 agonist-  pt denies GLP1 instructions: n/a  Blood Thinner Instructions:pt denies Aspirin Instructions:pt denies  ERAS Protcol - NPO after midnight   COVID TEST- n/a   Anesthesia review: YES   Patient's wife verbally denies any shortness of breath, fever, cough and chest pain during phone call.     -------------  SDW INSTRUCTIONS given to patient's wife-Angela :   Your procedure is scheduled on 03/10/22.             Report to [facility] Main Entrance "A" at  7:30  A.M., and check in at the Admitting office.             Call this number if you have problems the morning of surgery:             [phone]               Remember:             Do not eat or drink after midnight the night before your surgery                          Take these medicines the morning of surgery with A SIP OF WATER metoprolol tartrate (LOPRESSOR),rosuvastatin (CRESTOR),Finerenone (KERENDIA),acetaminophen (TYLENOL)-prn,pantoprazole (PROTONIX),TRESIBA FLEXTOUCH 50%(8 units) pm dose before surgery and morning of surgery   As of today, STOP taking any Aspirin (unless otherwise instructed by your surgeon) Aleve, Naproxen, Ibuprofen, Motrin, Advil, Goody's, BC's, all herbal medications, fish oil, and all vitamins.    Diabetic instructions-take half Tresiba dose pm day before surgery and am day of surgery-as directed-if Blood sugar greater than 129.    ** PLEASE check your blood sugar the morning of your surgery when you wake up and every 2 hours until you get to the Short Stay unit.   If your blood sugar is less than 70  mg/dL, you will need to treat for low blood sugar: Do not take insulin. Treat a low blood sugar (less than 70 mg/dL) with  cup of clear juice (cranberry or apple), 4 glucose tablets, OR glucose gel. Recheck blood sugar in 15 minutes after treatment (to make sure it is greater than 70 mg/dL). If your blood sugar is not greater than 70 mg/dL on recheck, call [phone] for further instructions.                       Do not wear jewelry, make up, or nail polish            Do not wear lotions, powders, perfumes/colognes, or deodorant.            Do not shave 48 hours prior to surgery.  Men may shave face and neck.            Do not bring valuables to the hospital.            [facility] is not responsible for any belongings or valuables.   Do NOT Smoke (Tobacco/Vaping) 24 hours prior to your procedure  If you use a CPAP at night, you may bring all equipment for your overnight stay.   Contacts, glasses, dentures or partials may not be worn into surgery.      For patients admitted to the hospital, discharge time will be determined by your treatment team.   Patients discharged the day of surgery will not be allowed to drive home, and someone needs to stay with them for 24 hours.     Special instructions:   [place]- Preparing For Surgery  Oral Hygiene is also important to reduce your risk of infection.  Remember - BRUSH YOUR TEETH THE MORNING OF SURGERY WITH YOUR REGULAR TOOTHPASTE   Before surgery, you can play an important role. Because skin is not sterile, your skin needs to be as free of germs as possible. You can reduce the number of germs on your skin by washing with Dial (or any antibacterial) Soap before surgery.    Please do not use if you have an allergy to antibacterial soaps. If your skin becomes reddened/irritated stop using the Antibacterial Soap  Do not shave (including legs and underarms) for at least 48 hours prior to surgery. It is OK to shave your face.   Please  follow these instructions carefully.              Shower the NIGHT BEFORE SURGERY and the MORNING OF SURGERY with (DIAL) Antibacterial Soap. Wash your body and hair with your normal shampoo/soap then rinse. Using a clean wash cloth wash from your neck down using the antibacterial soap. Wash thoroughly, paying special attention to the area where your surgery will be performed. Thoroughly rinse your body with warm water from the neck down.   Pat yourself dry with a CLEAN TOWEL.   Wear CLEAN PAJAMAS to bed the night before surgery.   Place CLEAN SHEETS on your bed the night of your surgery and DO NOT SLEEP WITH PETS.     Day of Surgery: Please shower morning of surgery using antibacterial soap Wear Clean/Comfortable clothing the morning of surgery Do not apply any deodorants/lotions.   Remember to brush your teeth WITH YOUR REGULAR TOOTHPASTE.   Questions were answered. Patient verbalized understanding of instructions.

## 2022-03-09 DIAGNOSIS — Z794 Long term (current) use of insulin: Secondary | ICD-10-CM | POA: Diagnosis not present

## 2022-03-09 DIAGNOSIS — I10 Essential (primary) hypertension: Secondary | ICD-10-CM | POA: Diagnosis not present

## 2022-03-09 DIAGNOSIS — M1990 Unspecified osteoarthritis, unspecified site: Secondary | ICD-10-CM | POA: Diagnosis not present

## 2022-03-09 DIAGNOSIS — D649 Anemia, unspecified: Secondary | ICD-10-CM | POA: Diagnosis not present

## 2022-03-09 DIAGNOSIS — J9611 Chronic respiratory failure with hypoxia: Secondary | ICD-10-CM | POA: Diagnosis not present

## 2022-03-09 DIAGNOSIS — Z9181 History of falling: Secondary | ICD-10-CM | POA: Diagnosis not present

## 2022-03-09 DIAGNOSIS — R339 Retention of urine, unspecified: Secondary | ICD-10-CM | POA: Diagnosis not present

## 2022-03-09 DIAGNOSIS — Z436 Encounter for attention to other artificial openings of urinary tract: Secondary | ICD-10-CM | POA: Diagnosis not present

## 2022-03-09 DIAGNOSIS — G35 Multiple sclerosis: Secondary | ICD-10-CM | POA: Diagnosis not present

## 2022-03-09 DIAGNOSIS — E669 Obesity, unspecified: Secondary | ICD-10-CM | POA: Diagnosis not present

## 2022-03-09 DIAGNOSIS — G0400 Acute disseminated encephalitis and encephalomyelitis, unspecified: Secondary | ICD-10-CM | POA: Diagnosis not present

## 2022-03-09 DIAGNOSIS — Z87440 Personal history of urinary (tract) infections: Secondary | ICD-10-CM | POA: Diagnosis not present

## 2022-03-09 DIAGNOSIS — E119 Type 2 diabetes mellitus without complications: Secondary | ICD-10-CM | POA: Diagnosis not present

## 2022-03-09 DIAGNOSIS — Z8673 Personal history of transient ischemic attack (TIA), and cerebral infarction without residual deficits: Secondary | ICD-10-CM | POA: Diagnosis not present

## 2022-03-09 DIAGNOSIS — Z6834 Body mass index (BMI) 34.0-34.9, adult: Secondary | ICD-10-CM | POA: Diagnosis not present

## 2022-03-09 DIAGNOSIS — Z466 Encounter for fitting and adjustment of urinary device: Secondary | ICD-10-CM | POA: Diagnosis not present

## 2022-03-09 DIAGNOSIS — E785 Hyperlipidemia, unspecified: Secondary | ICD-10-CM | POA: Diagnosis not present

## 2022-03-09 DIAGNOSIS — F419 Anxiety disorder, unspecified: Secondary | ICD-10-CM | POA: Diagnosis not present

## 2022-03-10 ENCOUNTER — Encounter (HOSPITAL_COMMUNITY): Payer: Self-pay

## 2022-03-10 ENCOUNTER — Ambulatory Visit (HOSPITAL_COMMUNITY): Admission: RE | Admit: 2022-03-10 | Payer: Medicare HMO | Source: Ambulatory Visit

## 2022-03-10 ENCOUNTER — Encounter (HOSPITAL_COMMUNITY): Admission: RE | Disposition: A | Discharge status: Home / Self Care | Payer: Self-pay | Source: Home / Self Care

## 2022-03-10 ENCOUNTER — Ambulatory Visit (HOSPITAL_COMMUNITY)
Admission: RE | Admit: 2022-03-10 | Discharge: 2022-03-10 | Disposition: A | Discharge status: Home / Self Care | Payer: Medicare HMO | Attending: Anesthesiology | Admitting: Anesthesiology

## 2022-03-10 ENCOUNTER — Telehealth: Payer: Self-pay | Admitting: Neurology

## 2022-03-10 ENCOUNTER — Ambulatory Visit: Payer: Medicare HMO | Admitting: Urology

## 2022-03-10 DIAGNOSIS — Z538 Procedure and treatment not carried out for other reasons: Secondary | ICD-10-CM | POA: Diagnosis not present

## 2022-03-10 DIAGNOSIS — G379 Demyelinating disease of central nervous system, unspecified: Secondary | ICD-10-CM | POA: Diagnosis not present

## 2022-03-10 HISTORY — PX: RADIOLOGY WITH ANESTHESIA: SHX6223

## 2022-03-10 LAB — GLUCOSE, CAPILLARY: Glucose-Capillary: 156 mg/dL — ABNORMAL HIGH (ref 70–99)

## 2022-03-10 SURGERY — MRI WITH ANESTHESIA
Anesthesia: General

## 2022-03-10 MED ORDER — LACTATED RINGERS IV SOLN: INTRAVENOUS | Status: DC

## 2022-03-10 MED ORDER — ORAL CARE MOUTH RINSE: 15.0000 mL | Freq: Once | OROMUCOSAL | Status: DC

## 2022-03-10 MED ORDER — CHLORHEXIDINE GLUCONATE 0.12 % MT SOLN
15.0000 mL | Freq: Once | OROMUCOSAL | Status: DC
  Filled 2022-03-10: qty 15

## 2022-03-10 NOTE — Telephone Encounter (Signed)
Took call from phone staff and spoke with wife. He got r/s for MRI under sedation 04/16/22. He requires in person visit within 30 days from this date. Rescheduled for in person visit 03/30/22 at 3pm with Dr. Felecia Shelling. Also scheduled mychart VV 04/20/22 at 3pm with Dr. Felecia Shelling to discuss results per wife request.  Cx apt 03/11/22 per wife request since he had to be r/s

## 2022-03-10 NOTE — Telephone Encounter (Signed)
Pt's wife, Anfrenee Mcclarin called was not able get MRI today. Imaging did not count the VV as pt being seen 30 days before MRI. They did do his MRI today. Asking if come to appt tomorrow will MRI coordinator to schedule MRI with in 30- days of after appt? Would like a call from the nurse. Would like a call back.

## 2022-03-10 NOTE — Telephone Encounter (Signed)
Called wife. Pt unable to have MRI. VV 02/23/22 would not suffice for office notes needed within 30 days of MRI. Offered work in visit today at 230pm with Dr. Felecia Shelling for in office visit. She will call [facility] back to see if they can get him back in within 30 days from today for MRI.  She will call office back once she speaks with them.

## 2022-03-10 NOTE — Progress Notes (Addendum)
I spoke with MRI as well as Dr Ermalene Postin. Per Both, no new labs needed for MRI with anesthesia today.

## 2022-03-10 NOTE — Progress Notes (Signed)
Video visit note with Dr. Felecia Shelling in chart for 02/23/22. Per policy patient needs to be seen in person for H/P for MRI with anesthesia. No H/P noted in chart within 30 days. Dr. Garth Bigness office notified. Dr Garth Bigness office unable to see patient today, but has an appointment scheduled to see patient in person tomorrow (03/11/22). Pt states he will not do MRI without anesthesia. Pt left with wife and will need to be re-scheduled. Dr Ermalene Postin and MRI notified. Message left for patient with number to re-schedule MRI with anesthesia.

## 2022-03-11 ENCOUNTER — Encounter (HOSPITAL_COMMUNITY): Payer: Self-pay | Admitting: Radiology

## 2022-03-11 ENCOUNTER — Ambulatory Visit: Payer: Medicare HMO | Admitting: Neurology

## 2022-03-16 ENCOUNTER — Other Ambulatory Visit (HOSPITAL_COMMUNITY): Payer: Medicare HMO

## 2022-03-17 ENCOUNTER — Telehealth: Payer: Self-pay

## 2022-03-17 DIAGNOSIS — E785 Hyperlipidemia, unspecified: Secondary | ICD-10-CM | POA: Diagnosis not present

## 2022-03-17 DIAGNOSIS — N390 Urinary tract infection, site not specified: Secondary | ICD-10-CM | POA: Diagnosis not present

## 2022-03-17 DIAGNOSIS — Z794 Long term (current) use of insulin: Secondary | ICD-10-CM | POA: Diagnosis not present

## 2022-03-17 DIAGNOSIS — Z8673 Personal history of transient ischemic attack (TIA), and cerebral infarction without residual deficits: Secondary | ICD-10-CM | POA: Diagnosis not present

## 2022-03-17 DIAGNOSIS — I10 Essential (primary) hypertension: Secondary | ICD-10-CM | POA: Diagnosis not present

## 2022-03-17 DIAGNOSIS — D649 Anemia, unspecified: Secondary | ICD-10-CM | POA: Diagnosis not present

## 2022-03-17 DIAGNOSIS — E119 Type 2 diabetes mellitus without complications: Secondary | ICD-10-CM | POA: Diagnosis not present

## 2022-03-17 DIAGNOSIS — G0400 Acute disseminated encephalitis and encephalomyelitis, unspecified: Secondary | ICD-10-CM | POA: Diagnosis not present

## 2022-03-17 DIAGNOSIS — G35 Multiple sclerosis: Secondary | ICD-10-CM | POA: Diagnosis not present

## 2022-03-17 DIAGNOSIS — Z9181 History of falling: Secondary | ICD-10-CM | POA: Diagnosis not present

## 2022-03-17 DIAGNOSIS — F419 Anxiety disorder, unspecified: Secondary | ICD-10-CM | POA: Diagnosis not present

## 2022-03-17 DIAGNOSIS — R339 Retention of urine, unspecified: Secondary | ICD-10-CM | POA: Diagnosis not present

## 2022-03-17 DIAGNOSIS — Z436 Encounter for attention to other artificial openings of urinary tract: Secondary | ICD-10-CM | POA: Diagnosis not present

## 2022-03-17 DIAGNOSIS — E669 Obesity, unspecified: Secondary | ICD-10-CM | POA: Diagnosis not present

## 2022-03-17 DIAGNOSIS — J9611 Chronic respiratory failure with hypoxia: Secondary | ICD-10-CM | POA: Diagnosis not present

## 2022-03-17 DIAGNOSIS — Z6834 Body mass index (BMI) 34.0-34.9, adult: Secondary | ICD-10-CM | POA: Diagnosis not present

## 2022-03-17 DIAGNOSIS — M1990 Unspecified osteoarthritis, unspecified site: Secondary | ICD-10-CM | POA: Diagnosis not present

## 2022-03-17 DIAGNOSIS — Z466 Encounter for fitting and adjustment of urinary device: Secondary | ICD-10-CM | POA: Diagnosis not present

## 2022-03-17 DIAGNOSIS — Z87440 Personal history of urinary (tract) infections: Secondary | ICD-10-CM | POA: Diagnosis not present

## 2022-03-17 NOTE — Telephone Encounter (Signed)
I spoke with [place] and informed her of Dr. Burman Riis response and gave verbal orders for MD.

## 2022-03-17 NOTE — Telephone Encounter (Signed)
-----   Message from Franchot Gallo, MD sent at 03/17/2022  2:29 PM EDT ----- Regarding: RE: Ua with Home healht He has an indwelling fully catheter, at least the last time I saw him four months ago. Doubt any significant bacteria will grow out, or at least multiple bacteria since that is what commonly is present in somebody with an indwelling fully catheter. If the specimen can be taken directly from the catheter and not the bag that is important. It's fine to send a culture. ----- Message ----- From: Audie Box, CMA Sent: 03/17/2022   2:23 PM EDT To: Franchot Gallo, MD Subject: Troy Randall with Home healht                            Tabitha a nurse with [facility] called.  States pt is confused/ agitated, had a temp of 100.2 over the weekend with vomiting/ diarrhea.  The wife thinks he may have a UTI.  Will you give verbal orders for ua and culture with home health?

## 2022-03-18 NOTE — Telephone Encounter (Signed)
Spoke with Tabitha from home health regarding verbal order from catheter. Made Tabitha aware that I will see a task to Dr. Diona Fanti a task on clarification and someone will reach back out. Tabitha voiced understanding.

## 2022-03-19 NOTE — Telephone Encounter (Signed)
Wife called concerning pt.s urine specimen results. Please advise

## 2022-03-20 ENCOUNTER — Telehealth: Payer: Self-pay

## 2022-03-20 MED ORDER — SULFAMETHOXAZOLE-TRIMETHOPRIM 800-160 MG PO TABS: 1.0000 | ORAL_TABLET | Freq: Two times a day (BID) | ORAL | 0 refills | Status: DC

## 2022-03-20 NOTE — Addendum Note (Signed)
Addended by: Darcella Gasman R on: 03/20/2022 01:05 PM   Modules accepted: Orders

## 2022-03-20 NOTE — Telephone Encounter (Signed)
Received fax labs for UA/UC had Dr. Alyson Ingles review results and treatment started 03/15. Labs will be scan into patient chart. Return call to patient/wife making her aware that Rx was sent to pharmacy. Patient/wife voiced understanding.

## 2022-03-20 NOTE — Telephone Encounter (Signed)
Open in error

## 2022-03-20 NOTE — Telephone Encounter (Addendum)
Patient's wife Levada Dy called in today and state that Tabitha from home health made her aware that patient has an UTI from urine drop. Levada Dy is aware that results have not been faxed over. Levada Dy is aware that I will keep a eye out on the fax and have MD review  labs and if treatment is needed antix will be sent to pharmacy. Urine culture pending. Levada Dy voiced understanding.

## 2022-03-23 DIAGNOSIS — M1990 Unspecified osteoarthritis, unspecified site: Secondary | ICD-10-CM | POA: Diagnosis not present

## 2022-03-23 DIAGNOSIS — E785 Hyperlipidemia, unspecified: Secondary | ICD-10-CM | POA: Diagnosis not present

## 2022-03-23 DIAGNOSIS — I10 Essential (primary) hypertension: Secondary | ICD-10-CM | POA: Diagnosis not present

## 2022-03-23 DIAGNOSIS — G35 Multiple sclerosis: Secondary | ICD-10-CM | POA: Diagnosis not present

## 2022-03-23 DIAGNOSIS — E119 Type 2 diabetes mellitus without complications: Secondary | ICD-10-CM | POA: Diagnosis not present

## 2022-03-23 DIAGNOSIS — E669 Obesity, unspecified: Secondary | ICD-10-CM | POA: Diagnosis not present

## 2022-03-23 DIAGNOSIS — Z87440 Personal history of urinary (tract) infections: Secondary | ICD-10-CM | POA: Diagnosis not present

## 2022-03-23 DIAGNOSIS — Z436 Encounter for attention to other artificial openings of urinary tract: Secondary | ICD-10-CM | POA: Diagnosis not present

## 2022-03-23 DIAGNOSIS — Z794 Long term (current) use of insulin: Secondary | ICD-10-CM | POA: Diagnosis not present

## 2022-03-23 DIAGNOSIS — G0400 Acute disseminated encephalitis and encephalomyelitis, unspecified: Secondary | ICD-10-CM | POA: Diagnosis not present

## 2022-03-23 DIAGNOSIS — Z6834 Body mass index (BMI) 34.0-34.9, adult: Secondary | ICD-10-CM | POA: Diagnosis not present

## 2022-03-23 DIAGNOSIS — F419 Anxiety disorder, unspecified: Secondary | ICD-10-CM | POA: Diagnosis not present

## 2022-03-23 DIAGNOSIS — Z9181 History of falling: Secondary | ICD-10-CM | POA: Diagnosis not present

## 2022-03-23 DIAGNOSIS — R339 Retention of urine, unspecified: Secondary | ICD-10-CM | POA: Diagnosis not present

## 2022-03-23 DIAGNOSIS — Z8673 Personal history of transient ischemic attack (TIA), and cerebral infarction without residual deficits: Secondary | ICD-10-CM | POA: Diagnosis not present

## 2022-03-23 DIAGNOSIS — Z466 Encounter for fitting and adjustment of urinary device: Secondary | ICD-10-CM | POA: Diagnosis not present

## 2022-03-23 DIAGNOSIS — D649 Anemia, unspecified: Secondary | ICD-10-CM | POA: Diagnosis not present

## 2022-03-23 DIAGNOSIS — J9611 Chronic respiratory failure with hypoxia: Secondary | ICD-10-CM | POA: Diagnosis not present

## 2022-03-25 DIAGNOSIS — J9611 Chronic respiratory failure with hypoxia: Secondary | ICD-10-CM | POA: Diagnosis not present

## 2022-03-25 DIAGNOSIS — G8929 Other chronic pain: Secondary | ICD-10-CM | POA: Diagnosis not present

## 2022-03-25 DIAGNOSIS — Z794 Long term (current) use of insulin: Secondary | ICD-10-CM | POA: Diagnosis not present

## 2022-03-25 DIAGNOSIS — I10 Essential (primary) hypertension: Secondary | ICD-10-CM | POA: Diagnosis not present

## 2022-03-25 DIAGNOSIS — Z9181 History of falling: Secondary | ICD-10-CM | POA: Diagnosis not present

## 2022-03-25 DIAGNOSIS — Z6834 Body mass index (BMI) 34.0-34.9, adult: Secondary | ICD-10-CM | POA: Diagnosis not present

## 2022-03-25 DIAGNOSIS — Z87440 Personal history of urinary (tract) infections: Secondary | ICD-10-CM | POA: Diagnosis not present

## 2022-03-25 DIAGNOSIS — G35 Multiple sclerosis: Secondary | ICD-10-CM | POA: Diagnosis not present

## 2022-03-25 DIAGNOSIS — M1990 Unspecified osteoarthritis, unspecified site: Secondary | ICD-10-CM | POA: Diagnosis not present

## 2022-03-25 DIAGNOSIS — F419 Anxiety disorder, unspecified: Secondary | ICD-10-CM | POA: Diagnosis not present

## 2022-03-25 DIAGNOSIS — Z466 Encounter for fitting and adjustment of urinary device: Secondary | ICD-10-CM | POA: Diagnosis not present

## 2022-03-25 DIAGNOSIS — R5381 Other malaise: Secondary | ICD-10-CM | POA: Diagnosis not present

## 2022-03-25 DIAGNOSIS — Z8673 Personal history of transient ischemic attack (TIA), and cerebral infarction without residual deficits: Secondary | ICD-10-CM | POA: Diagnosis not present

## 2022-03-25 DIAGNOSIS — E669 Obesity, unspecified: Secondary | ICD-10-CM | POA: Diagnosis not present

## 2022-03-25 DIAGNOSIS — D649 Anemia, unspecified: Secondary | ICD-10-CM | POA: Diagnosis not present

## 2022-03-25 DIAGNOSIS — E785 Hyperlipidemia, unspecified: Secondary | ICD-10-CM | POA: Diagnosis not present

## 2022-03-25 DIAGNOSIS — R339 Retention of urine, unspecified: Secondary | ICD-10-CM | POA: Diagnosis not present

## 2022-03-25 DIAGNOSIS — Z436 Encounter for attention to other artificial openings of urinary tract: Secondary | ICD-10-CM | POA: Diagnosis not present

## 2022-03-25 DIAGNOSIS — E119 Type 2 diabetes mellitus without complications: Secondary | ICD-10-CM | POA: Diagnosis not present

## 2022-03-25 DIAGNOSIS — M5440 Lumbago with sciatica, unspecified side: Secondary | ICD-10-CM | POA: Diagnosis not present

## 2022-03-25 DIAGNOSIS — G0400 Acute disseminated encephalitis and encephalomyelitis, unspecified: Secondary | ICD-10-CM | POA: Diagnosis not present

## 2022-03-27 DIAGNOSIS — M5440 Lumbago with sciatica, unspecified side: Secondary | ICD-10-CM | POA: Diagnosis not present

## 2022-03-27 DIAGNOSIS — R5381 Other malaise: Secondary | ICD-10-CM | POA: Diagnosis not present

## 2022-03-27 DIAGNOSIS — G8929 Other chronic pain: Secondary | ICD-10-CM | POA: Diagnosis not present

## 2022-03-27 DIAGNOSIS — J9611 Chronic respiratory failure with hypoxia: Secondary | ICD-10-CM | POA: Diagnosis not present

## 2022-03-27 DIAGNOSIS — G0400 Acute disseminated encephalitis and encephalomyelitis, unspecified: Secondary | ICD-10-CM | POA: Diagnosis not present

## 2022-03-29 ENCOUNTER — Inpatient Hospital Stay (HOSPITAL_COMMUNITY)
Admission: EM | Admit: 2022-03-29 | Discharge: 2022-04-03 | DRG: 659 | Disposition: A | Discharge status: Home Health | Payer: Medicare HMO | Attending: Family Medicine | Admitting: Family Medicine

## 2022-03-29 ENCOUNTER — Emergency Department (HOSPITAL_COMMUNITY): Payer: Medicare HMO

## 2022-03-29 ENCOUNTER — Encounter (HOSPITAL_COMMUNITY): Payer: Self-pay | Admitting: Emergency Medicine

## 2022-03-29 ENCOUNTER — Other Ambulatory Visit: Payer: Self-pay

## 2022-03-29 DIAGNOSIS — A4150 Gram-negative sepsis, unspecified: Secondary | ICD-10-CM | POA: Diagnosis present

## 2022-03-29 DIAGNOSIS — Z794 Long term (current) use of insulin: Secondary | ICD-10-CM

## 2022-03-29 DIAGNOSIS — Z87891 Personal history of nicotine dependence: Secondary | ICD-10-CM

## 2022-03-29 DIAGNOSIS — Z87442 Personal history of urinary calculi: Secondary | ICD-10-CM

## 2022-03-29 DIAGNOSIS — R0689 Other abnormalities of breathing: Secondary | ICD-10-CM | POA: Diagnosis not present

## 2022-03-29 DIAGNOSIS — E872 Acidosis, unspecified: Secondary | ICD-10-CM | POA: Diagnosis present

## 2022-03-29 DIAGNOSIS — Y846 Urinary catheterization as the cause of abnormal reaction of the patient, or of later complication, without mention of misadventure at the time of the procedure: Secondary | ICD-10-CM | POA: Diagnosis present

## 2022-03-29 DIAGNOSIS — Z743 Need for continuous supervision: Secondary | ICD-10-CM | POA: Diagnosis not present

## 2022-03-29 DIAGNOSIS — E785 Hyperlipidemia, unspecified: Secondary | ICD-10-CM | POA: Diagnosis present

## 2022-03-29 DIAGNOSIS — N390 Urinary tract infection, site not specified: Secondary | ICD-10-CM | POA: Diagnosis not present

## 2022-03-29 DIAGNOSIS — T370X5A Adverse effect of sulfonamides, initial encounter: Secondary | ICD-10-CM | POA: Diagnosis present

## 2022-03-29 DIAGNOSIS — G9341 Metabolic encephalopathy: Secondary | ICD-10-CM | POA: Diagnosis present

## 2022-03-29 DIAGNOSIS — N319 Neuromuscular dysfunction of bladder, unspecified: Secondary | ICD-10-CM | POA: Insufficient documentation

## 2022-03-29 DIAGNOSIS — R000 Tachycardia, unspecified: Secondary | ICD-10-CM | POA: Diagnosis not present

## 2022-03-29 DIAGNOSIS — K8060 Calculus of gallbladder and bile duct with cholecystitis, unspecified, without obstruction: Secondary | ICD-10-CM | POA: Diagnosis present

## 2022-03-29 DIAGNOSIS — E875 Hyperkalemia: Secondary | ICD-10-CM | POA: Diagnosis present

## 2022-03-29 DIAGNOSIS — E1165 Type 2 diabetes mellitus with hyperglycemia: Secondary | ICD-10-CM | POA: Diagnosis present

## 2022-03-29 DIAGNOSIS — N200 Calculus of kidney: Secondary | ICD-10-CM | POA: Diagnosis not present

## 2022-03-29 DIAGNOSIS — T83511A Infection and inflammatory reaction due to indwelling urethral catheter, initial encounter: Principal | ICD-10-CM | POA: Diagnosis present

## 2022-03-29 DIAGNOSIS — N281 Cyst of kidney, acquired: Secondary | ICD-10-CM | POA: Diagnosis present

## 2022-03-29 DIAGNOSIS — Z7985 Long-term (current) use of injectable non-insulin antidiabetic drugs: Secondary | ICD-10-CM

## 2022-03-29 DIAGNOSIS — E876 Hypokalemia: Secondary | ICD-10-CM | POA: Diagnosis not present

## 2022-03-29 DIAGNOSIS — T7840XA Allergy, unspecified, initial encounter: Secondary | ICD-10-CM | POA: Diagnosis not present

## 2022-03-29 DIAGNOSIS — E861 Hypovolemia: Secondary | ICD-10-CM | POA: Diagnosis present

## 2022-03-29 DIAGNOSIS — Y738 Miscellaneous gastroenterology and urology devices associated with adverse incidents, not elsewhere classified: Secondary | ICD-10-CM | POA: Diagnosis present

## 2022-03-29 DIAGNOSIS — Z79899 Other long term (current) drug therapy: Secondary | ICD-10-CM

## 2022-03-29 DIAGNOSIS — Z8719 Personal history of other diseases of the digestive system: Secondary | ICD-10-CM

## 2022-03-29 DIAGNOSIS — Z6830 Body mass index (BMI) 30.0-30.9, adult: Secondary | ICD-10-CM

## 2022-03-29 DIAGNOSIS — N179 Acute kidney failure, unspecified: Secondary | ICD-10-CM | POA: Diagnosis not present

## 2022-03-29 DIAGNOSIS — L89151 Pressure ulcer of sacral region, stage 1: Secondary | ICD-10-CM | POA: Diagnosis present

## 2022-03-29 DIAGNOSIS — N201 Calculus of ureter: Secondary | ICD-10-CM | POA: Diagnosis not present

## 2022-03-29 DIAGNOSIS — R9082 White matter disease, unspecified: Secondary | ICD-10-CM | POA: Diagnosis present

## 2022-03-29 DIAGNOSIS — E119 Type 2 diabetes mellitus without complications: Secondary | ICD-10-CM

## 2022-03-29 DIAGNOSIS — Z880 Allergy status to penicillin: Secondary | ICD-10-CM

## 2022-03-29 DIAGNOSIS — Z8249 Family history of ischemic heart disease and other diseases of the circulatory system: Secondary | ICD-10-CM

## 2022-03-29 DIAGNOSIS — I679 Cerebrovascular disease, unspecified: Secondary | ICD-10-CM | POA: Insufficient documentation

## 2022-03-29 DIAGNOSIS — L8990 Pressure ulcer of unspecified site, unspecified stage: Secondary | ICD-10-CM | POA: Diagnosis present

## 2022-03-29 DIAGNOSIS — Z8673 Personal history of transient ischemic attack (TIA), and cerebral infarction without residual deficits: Secondary | ICD-10-CM

## 2022-03-29 DIAGNOSIS — I10 Essential (primary) hypertension: Secondary | ICD-10-CM | POA: Diagnosis present

## 2022-03-29 DIAGNOSIS — Z8661 Personal history of infections of the central nervous system: Secondary | ICD-10-CM

## 2022-03-29 DIAGNOSIS — E669 Obesity, unspecified: Secondary | ICD-10-CM | POA: Diagnosis present

## 2022-03-29 DIAGNOSIS — N202 Calculus of kidney with calculus of ureter: Secondary | ICD-10-CM | POA: Diagnosis present

## 2022-03-29 DIAGNOSIS — M1990 Unspecified osteoarthritis, unspecified site: Secondary | ICD-10-CM | POA: Diagnosis present

## 2022-03-29 DIAGNOSIS — R6520 Severe sepsis without septic shock: Secondary | ICD-10-CM | POA: Diagnosis present

## 2022-03-29 DIAGNOSIS — L511 Stevens-Johnson syndrome: Secondary | ICD-10-CM | POA: Diagnosis present

## 2022-03-29 DIAGNOSIS — Z882 Allergy status to sulfonamides status: Secondary | ICD-10-CM

## 2022-03-29 DIAGNOSIS — R21 Rash and other nonspecific skin eruption: Secondary | ICD-10-CM

## 2022-03-29 DIAGNOSIS — N136 Pyonephrosis: Secondary | ICD-10-CM | POA: Diagnosis present

## 2022-03-29 DIAGNOSIS — E871 Hypo-osmolality and hyponatremia: Secondary | ICD-10-CM | POA: Insufficient documentation

## 2022-03-29 DIAGNOSIS — I959 Hypotension, unspecified: Secondary | ICD-10-CM | POA: Diagnosis not present

## 2022-03-29 DIAGNOSIS — K529 Noninfective gastroenteritis and colitis, unspecified: Secondary | ICD-10-CM | POA: Diagnosis present

## 2022-03-29 DIAGNOSIS — I493 Ventricular premature depolarization: Secondary | ICD-10-CM | POA: Diagnosis present

## 2022-03-29 DIAGNOSIS — Z87440 Personal history of urinary (tract) infections: Secondary | ICD-10-CM

## 2022-03-29 DIAGNOSIS — E66812 Obesity, class 2: Secondary | ICD-10-CM | POA: Diagnosis present

## 2022-03-29 DIAGNOSIS — A419 Sepsis, unspecified organism: Secondary | ICD-10-CM | POA: Diagnosis not present

## 2022-03-29 DIAGNOSIS — G0400 Acute disseminated encephalitis and encephalomyelitis, unspecified: Secondary | ICD-10-CM | POA: Diagnosis present

## 2022-03-29 DIAGNOSIS — D638 Anemia in other chronic diseases classified elsewhere: Secondary | ICD-10-CM | POA: Diagnosis present

## 2022-03-29 DIAGNOSIS — Z833 Family history of diabetes mellitus: Secondary | ICD-10-CM

## 2022-03-29 DIAGNOSIS — L270 Generalized skin eruption due to drugs and medicaments taken internally: Secondary | ICD-10-CM | POA: Diagnosis present

## 2022-03-29 LAB — COMPREHENSIVE METABOLIC PANEL
ALT: 35 U/L (ref 0–44)
AST: 41 U/L (ref 15–41)
Albumin: 3.4 g/dL — ABNORMAL LOW (ref 3.5–5.0)
Alkaline Phosphatase: 63 U/L (ref 38–126)
Anion gap: 16 — ABNORMAL HIGH (ref 5–15)
BUN: 25 mg/dL — ABNORMAL HIGH (ref 6–20)
CO2: 22 mmol/L (ref 22–32)
Calcium: 9.5 mg/dL (ref 8.9–10.3)
Chloride: 94 mmol/L — ABNORMAL LOW (ref 98–111)
Creatinine, Ser: 2.41 mg/dL — ABNORMAL HIGH (ref 0.61–1.24)
GFR, Estimated: 30 mL/min — ABNORMAL LOW (ref >60)
Glucose, Bld: 118 mg/dL — ABNORMAL HIGH (ref 70–99)
Potassium: 5.4 mmol/L — ABNORMAL HIGH (ref 3.5–5.1)
Sodium: 132 mmol/L — ABNORMAL LOW (ref 135–145)
Total Bilirubin: 0.9 mg/dL (ref 0.3–1.2)
Total Protein: 7.4 g/dL (ref 6.5–8.1)

## 2022-03-29 LAB — PROTIME-INR
INR: 1.1 (ref 0.8–1.2)
Prothrombin Time: 13.9 seconds (ref 11.4–15.2)

## 2022-03-29 LAB — CBC WITH DIFFERENTIAL/PLATELET
Abs Immature Granulocytes: 0.04 10*3/uL (ref 0.00–0.07)
Basophils Absolute: 0 10*3/uL (ref 0.0–0.1)
Basophils Relative: 1 %
Eosinophils Absolute: 0.4 10*3/uL (ref 0.0–0.5)
Eosinophils Relative: 5 %
HCT: 41.1 % (ref 39.0–52.0)
Hemoglobin: 13.5 g/dL (ref 13.0–17.0)
Immature Granulocytes: 1 %
Lymphocytes Relative: 15 %
Lymphs Abs: 1.1 10*3/uL (ref 0.7–4.0)
MCH: 28.1 pg (ref 26.0–34.0)
MCHC: 32.8 g/dL (ref 30.0–36.0)
MCV: 85.4 fL (ref 80.0–100.0)
Monocytes Absolute: 0.4 10*3/uL (ref 0.1–1.0)
Monocytes Relative: 5 %
Neutro Abs: 5.5 10*3/uL (ref 1.7–7.7)
Neutrophils Relative %: 73 %
Platelets: 202 10*3/uL (ref 150–400)
RBC: 4.81 MIL/uL (ref 4.22–5.81)
RDW: 13.3 % (ref 11.5–15.5)
WBC: 7.5 10*3/uL (ref 4.0–10.5)
nRBC: 0 % (ref 0.0–0.2)

## 2022-03-29 LAB — URINALYSIS, ROUTINE W REFLEX MICROSCOPIC
Bilirubin Urine: NEGATIVE
Glucose, UA: NEGATIVE mg/dL
Ketones, ur: 20 mg/dL — AB
Nitrite: NEGATIVE
Protein, ur: 30 mg/dL — AB
RBC / HPF: >50 RBC/hpf (ref 0–5)
Specific Gravity, Urine: 1.019 (ref 1.005–1.030)
WBC, UA: >50 WBC/hpf (ref 0–5)
pH: 5 (ref 5.0–8.0)

## 2022-03-29 LAB — LACTIC ACID, PLASMA
Lactic Acid, Venous: 2.9 mmol/L (ref 0.5–1.9)
Lactic Acid, Venous: 2.2 mmol/L (ref 0.5–1.9)

## 2022-03-29 LAB — APTT: aPTT: 24 seconds (ref 24–36)

## 2022-03-29 MED ORDER — LACTATED RINGERS IV BOLUS: 1000.0000 mL | Freq: Once | INTRAVENOUS | Status: DC

## 2022-03-29 MED ORDER — LACTATED RINGERS IV SOLN: INTRAVENOUS | Status: DC

## 2022-03-29 MED ORDER — SODIUM CHLORIDE 0.9 % IV BOLUS
1000.0000 mL | Freq: Once | INTRAVENOUS | Status: AC
  Administered 2022-03-29: 1000 mL via INTRAVENOUS

## 2022-03-29 MED ORDER — VANCOMYCIN HCL 2000 MG/400ML IV SOLN
2000.0000 mg | Freq: Once | INTRAVENOUS | Status: AC
  Administered 2022-03-29: 2000 mg via INTRAVENOUS
  Filled 2022-03-29: qty 400

## 2022-03-29 MED ORDER — HYDROMORPHONE HCL 1 MG/ML IJ SOLN
1.0000 mg | Freq: Once | INTRAMUSCULAR | Status: AC
  Administered 2022-03-29: 1 mg via INTRAVENOUS
  Filled 2022-03-29: qty 1

## 2022-03-29 MED ORDER — VANCOMYCIN VARIABLE DOSE PER UNSTABLE RENAL FUNCTION (PHARMACIST DOSING): Status: DC

## 2022-03-29 MED ORDER — SODIUM ZIRCONIUM CYCLOSILICATE 10 G PO PACK
10.0000 g | PACK | Freq: Two times a day (BID) | ORAL | Status: DC
  Administered 2022-03-29: 10 g via ORAL
  Filled 2022-03-29: qty 1

## 2022-03-29 MED ORDER — ONDANSETRON HCL 4 MG/2ML IJ SOLN
4.0000 mg | Freq: Once | INTRAMUSCULAR | Status: AC
  Administered 2022-03-29: 4 mg via INTRAVENOUS
  Filled 2022-03-29: qty 2

## 2022-03-29 MED ORDER — SODIUM CHLORIDE 0.9 % IV SOLN
2.0000 g | Freq: Two times a day (BID) | INTRAVENOUS | Status: DC
  Administered 2022-03-29 – 2022-03-31 (×4): 2 g via INTRAVENOUS
  Filled 2022-03-29 (×4): qty 12.5

## 2022-03-29 NOTE — ED Notes (Signed)
Patient transported to CT 

## 2022-03-29 NOTE — ED Provider Notes (Signed)
  Physical Exam  BP 111/81 (BP Location: Right Arm)   Pulse (!) 113   Temp 97.8 F (36.6 C) (Oral)   Resp 20   Ht 6' (1.829 m)   Wt 102.5 kg   SpO2 100%   BMI 30.65 kg/m   Physical Exam HENT:     Mouth/Throat:     Mouth: Mucous membranes are dry.     Comments: Dry mucous membranes.  I do not appreciate any lesions or exudate or erythema to the gums, inner oral mucosa, posterior oropharynx, or soft palate. Pulmonary:     Effort: Pulmonary effort is normal. No respiratory distress.  Skin:    Findings: Rash present.     Comments: Ocular papular rash diffuse to patient's chest arms and bilateral lower extremities.  Raised and dry.  No skin sloughing.  No vesicles or blisters noted.  No ulcerations or discharge noted from the wounds.  Nontender to palpation.  Neurological:     Mental Status: He is alert.     Procedures  Procedures  ED Course / MDM    Medical Decision Making Amount and/or Complexity of Data Reviewed Labs: ordered. Radiology: ordered. ECG/medicine tests: ordered.  Risk Prescription drug management. Decision regarding hospitalization.   Accepted handoff at shift change from [facility], [place]. Please see prior provider note for more detail.   Briefly: Patient is 62 y.o. M presenting for evaluation of fever, rash, confusion.  Comorbid medical problems including diabetes, multiple strokes, GI bleed status post IR coil of jejunal branches of SMA, hypertension, urinary retention with chronic indwelling Foley catheter, chronic colitis, cholecystitis status post cholecystostomy tube originally placed in September 2023.  He was recently placed on Bactrim by urology for a UTI and has taken all but 1 tablet but last night broke out in a rash all over his entire body that is described as raised and itchy.  He denies pain with the rash.   DDX: concern for sepsis with needing admission.   Plan: Follow up after hospitalist.  Need to be transferred to [facility] for  stent placement.  Hospitalist assessed at bedside.  She is declining admission given concern for SJS and is requesting tertiary center for dermatology evaluation.  Patient has never had Bactrim before, this was his first time taking Bactrim.  Rash presented on the next last day.  He describes it is itchy, not painful.  There is no blisters.  He denies any mouth ulcers or pain in his mouth or throat.  I consulted [facility] who declines given that they are at capacity and not accepting transfers.  I consulted our critical care physician, Dr. Patsey Berthold, about this patient.  She thinks that this is likely a drug eruption rash however will will monitor for possible SJS development.  She accepts the patient into her service's care.  She spoke with Dr. Gilford Rile with urology who did not mention anything about being transferred to [facility], we will keep him over to [facility] for now.       Sherrell Puller, PA-C 03/30/22 0200    Davonna Belling, MD 03/30/22 1451

## 2022-03-29 NOTE — ED Notes (Signed)
ICU Provider at bedside consulting with pt.

## 2022-03-29 NOTE — ED Provider Notes (Signed)
BLADDER CATHETERIZATION  Date/Time: 03/29/2022 8:41 PM  Performed by: Deno Etienne, DO Authorized by: Deno Etienne, DO   Consent:    Consent obtained:  Verbal   Consent given by:  Patient   Risks, benefits, and alternatives were discussed: yes     Risks discussed:  Infection, incomplete procedure, pain and urethral injury   Alternatives discussed:  No treatment and delayed treatment Universal protocol:    Procedure explained and questions answered to patient or proxy's satisfaction: yes     Patient identity confirmed:  Verbally with patient Pre-procedure details:    Procedure purpose:  Diagnostic   Preparation: Patient was prepped and draped in usual sterile fashion   Anesthesia:    Anesthesia method:  None Procedure details:    Provider performed due to:  Altered anatomy   Altered anatomy details: erosion of the meatus.   Catheter insertion:  Indwelling   Catheter type:  Foley   Catheter size:  14 Fr   Bladder irrigation: no     Number of attempts:  1   Urine characteristics:  Clear Post-procedure details:    Procedure completion:  Tolerated well, no immediate complications     Deno Etienne, DO 03/29/22 2042

## 2022-03-29 NOTE — ED Provider Notes (Signed)
[facility] Provider Note   CSN: [id number] Arrival date & time: 03/29/22  1436     History  Chief Complaint  Patient presents with   rash/?uti   Rash    Troy Randall is a 62 y.o. male.  He has many comorbid medical problems including diabetes, multiple strokes, GI bleed status post IR coil of jejunal branches of SMA, hypertension, urinary retention with chronic indwelling Foley catheter, chronic colitis, cholecystitis status post cholecystostomy tube originally placed in September 2023.  He was recently placed on Bactrim by urology for a UTI and has taken all but 1 tablet but last night broke out in a rash all over his entire body that is described as raised and itchy.  He denies pain with the rash.  Denies any lesions in his mouth.  He is normally given Cipro for his UTIs but this time was given Bactrim. His wife is at bedside and states he has been confused, had a temperature of 100.4 last night and also states that he has been having leaking around his cholecystostomy tube when she flushes it.   Rash      Home Medications Prior to Admission medications   Medication Sig Start Date End Date Taking? Authorizing Provider  sulfamethoxazole-trimethoprim (BACTRIM DS) 800-160 MG tablet Take 1 tablet by mouth every 12 (twelve) hours. 03/20/22  Yes McKenzie, Candee Furbish, MD  acetaminophen (TYLENOL) 325 MG tablet Take 1-2 tablets (325-650 mg total) by mouth every 4 (four) hours as needed for mild pain. 11/26/21   Setzer, Edman Circle, PA-C  ascorbic acid (VITAMIN C) 500 MG tablet Take 500 mg by mouth daily.    [provider]  BD VEO INSULIN SYRINGE U/F 31G X 15/64" 1 ML MISC  USE AS DIRECTED 06/08/17   Hagler, Apolonio Schneiders, MD  Finerenone (KERENDIA) 20 MG TABS Take 20 mg by mouth daily.    [provider]  glucose blood (ONETOUCH VERIO) test strip TEST twice a day 05/04/17   Caren Macadam, MD  Insulin Pen Needle (NOVOTWIST) 32G X 5 MM  MISC Use two daily to inject Victoza and Toujeo. 04/25/15   Elayne Snare, MD  methocarbamol (ROBAXIN) 500 MG tablet Take 1 tablet (500 mg total) by mouth every 6 (six) hours as needed for muscle spasms. Patient taking differently: Take 500 mg by mouth at bedtime. 11/26/21   Setzer, Edman Circle, PA-C  metoprolol tartrate (LOPRESSOR) 25 MG tablet Take 1 tablet (25 mg total) by mouth 2 (two) times daily. 11/26/21   Setzer, Edman Circle, PA-C  ONETOUCH DELICA LANCETS 99991111 MISC Use to check blood sugar once a day dx code E11.65 11/21/14   Elayne Snare, MD  pantoprazole (PROTONIX) 40 MG tablet Take 1 tablet (40 mg total) by mouth daily. 11/27/21   Setzer, Edman Circle, PA-C  Probiotic Product (PROBIOTIC PO) Take 8.5 mg by mouth daily.    [provider]  rosuvastatin (CRESTOR) 40 MG tablet Take 1 tablet (40 mg total) by mouth daily. 04/29/21   Little Ishikawa, MD  tamsulosin (FLOMAX) 0.4 MG CAPS capsule Take 1 capsule (0.4 mg total) by mouth daily after supper. Patient not taking: Reported on 03/05/2022 11/26/21   Barbie Banner, PA-C  traZODone (DESYREL) 50 MG tablet Take 0.5-1 tablets (25-50 mg total) by mouth at bedtime as needed for sleep. Patient taking differently: Take 150 mg by mouth at bedtime. 11/26/21   Setzer, Edman Circle, PA-C  TRESIBA FLEXTOUCH 200 UNIT/ML FlexTouch Pen  Inject under skin 15 units twice a day. Patient taking differently: Inject 16 Units into the skin 2 (two) times daily. If Blood glucose is over 129 11/26/21   Setzer, Edman Circle, PA-C      Allergies    Amoxicillin    Review of Systems   Review of Systems  Skin:  Positive for rash.    Physical Exam Updated Vital Signs BP (!) 130/98 (BP Location: Right Arm)   Pulse (!) 113   Temp 97.9 F (36.6 C) (Oral)   Resp (!) 21   Ht 6' (1.829 m)   Wt 102.5 kg   SpO2 98%   BMI 30.65 kg/m  Physical Exam HENT:     Head: Normocephalic and atraumatic.     Mouth/Throat:     Mouth: Mucous membranes are moist.  Cardiovascular:      Rate and Rhythm: Normal rate and regular rhythm.  Pulmonary:     Effort: Pulmonary effort is normal.     Breath sounds: Normal breath sounds.  Skin:    General: Skin is warm and dry.     Findings: Rash present.     Comments: Diffuse papular rash to arms, legs, torso, face. No lesions to palms or soles. No skin sloughing    Neurological:     General: No focal deficit present.     Mental Status: He is alert.     ED Results / Procedures / Treatments   Labs (all labs ordered are listed, but only abnormal results are displayed) Labs Reviewed  COMPREHENSIVE METABOLIC PANEL - Abnormal; Notable for the following components:      Result Value   Sodium 132 (*)    Potassium 5.4 (*)    Chloride 94 (*)    Glucose, Bld 118 (*)    BUN 25 (*)    Creatinine, Ser 2.41 (*)    Albumin 3.4 (*)    GFR, Estimated 30 (*)    Anion gap 16 (*)    All other components within normal limits  LACTIC ACID, PLASMA - Abnormal; Notable for the following components:   Lactic Acid, Venous 2.9 (*)    All other components within normal limits  URINALYSIS, ROUTINE W REFLEX MICROSCOPIC - Abnormal; Notable for the following components:   APPearance HAZY (*)    Hgb urine dipstick MODERATE (*)    Ketones, ur 20 (*)    Protein, ur 30 (*)    Leukocytes,Ua LARGE (*)    Bacteria, UA RARE (*)    All other components within normal limits  CULTURE, BLOOD (ROUTINE X 2)  CULTURE, BLOOD (ROUTINE X 2)  URINE CULTURE  URINE CULTURE  URINE CULTURE  CBC WITH DIFFERENTIAL/PLATELET  PROTIME-INR  APTT  LACTIC ACID, PLASMA  I-STAT VENOUS BLOOD GAS, ED    EKG None  Radiology CT ABDOMEN PELVIS WO CONTRAST  Result Date: 03/29/2022 CLINICAL DATA:  Pain EXAM: CT ABDOMEN AND PELVIS WITHOUT CONTRAST TECHNIQUE: Multidetector CT imaging of the abdomen and pelvis was performed following the standard protocol without IV contrast. RADIATION DOSE REDUCTION: This exam was performed according to the departmental  dose-optimization program which includes automated exposure control, adjustment of the mA and/or kV according to patient size and/or use of iterative reconstruction technique. COMPARISON:  11/08/2021 FINDINGS: Lower chest: Ground-glass opacity consistent with pneumonitis or edema. No pericardial or pleural effusions. Atheromatous calcification coronary arteries. Hepatobiliary: Percutaneous drainage catheter right upper quadrant. Gallbladder not identified. No biliary ductal dilatation. Pancreas: Unremarkable. No pancreatic ductal dilatation or surrounding inflammatory  changes. Adrenals/Urinary Tract: Left-sided hydronephrosis and hydroureter with a stone at the left UVJ measuring 6 mm. A few stones noted in the urinary bladder. There is a Foley catheter. Right-sided intrarenal 2 mm stones identified. Right kidney lesion consistent with a 2 cm cyst. Stomach/Bowel: Stomach is within normal limits. Appendix appears normal. No evidence of bowel wall thickening, distention, or inflammatory changes. Vascular/Lymphatic: No significant vascular findings are present. No enlarged abdominal or pelvic lymph nodes. Reproductive: Prostate is unremarkable. Other: Postop changes anterior abdominal wall repair with a mesh. Small periumbilical abdominal wall defect containing omental fat. Musculoskeletal: No acute or significant osseous findings. IMPRESSION: 1. Bilateral nephrolithiasis. 2. Left UVJ 6 mm stone with obstruction. 3. Right kidney cyst. 4. Right upper quadrant percutaneous drain. No fluid collections identified. 5. Anterior abdominal postop changes status post hernia repair with a small residual abdominal wall defect. Electronically Signed   By: Sammie Bench M.D.   On: 03/29/2022 18:56   DG Chest Port 1 View  Result Date: 03/29/2022 CLINICAL DATA:  Sepsis. EXAM: PORTABLE CHEST 1 VIEW COMPARISON:  November 06, 2021. FINDINGS: The heart size and mediastinal contours are within normal limits. Both lungs are clear.  The visualized skeletal structures are unremarkable. IMPRESSION: No active disease. Electronically Signed   By: Marijo Conception M.D.   On: 03/29/2022 16:29    Procedures Procedures    Medications Ordered in ED Medications  ceFEPIme (MAXIPIME) 2 g in sodium chloride 0.9 % 100 mL IVPB (0 g Intravenous Stopped 03/29/22 1833)  vancomycin variable dose per unstable renal function (pharmacist dosing) (has no administration in time range)  lactated ringers infusion (has no administration in time range)  sodium chloride 0.9 % bolus 1,000 mL (0 mLs Intravenous Stopped 03/29/22 2107)  vancomycin (VANCOREADY) IVPB 2000 mg/400 mL (0 mg Intravenous Stopped 03/29/22 2057)  HYDROmorphone (DILAUDID) injection 1 mg (1 mg Intravenous Given 03/29/22 1945)  ondansetron (ZOFRAN) injection 4 mg (4 mg Intravenous Given 03/29/22 1944)    ED Course/ Medical Decision Making/ A&P                             Medical Decision Making This patient presents to the ED for concern of rash, confusion, fever that started last night, this involves an extensive number of treatment options, and is a complaint that carries with it a high risk of complications and morbidity.  The differential diagnosis includes sepsis, UTI, drug rash, other   Co morbidities that complicate the patient evaluation  Diabetes, stroke, chronic indwelling Foley catheter, GI bleed cholecystostomy tube   Additional history obtained:  Additional history obtained from EMR External records from outside source obtained and reviewed including our inpatient notes   Lab Tests:  I Ordered, and personally interpreted labs.  The pertinent results include: Patient has significant AKI, lactic acidosis, UTI, mild hyperkalemia being treated with fluids   Imaging Studies ordered:  I ordered imaging studies including chest x-ray I independently visualized and interpreted imaging which showed pulmonary edema or infiltrate I agree with the radiologist  interpretation  The abdomen pelvis shows 6 mm right UVJ stone   Cardiac Monitoring: / EKG:  The patient was maintained on a cardiac monitor.  I personally viewed and interpreted the cardiac monitored which showed an underlying rhythm of: sinus rhythm   Consultations Obtained:  I requested consultation with the on-call urologist Dr. Lovena Neighbours,  and discussed lab and imaging findings as well as pertinent plan -  they recommend: Continue fluids, continue IV antibiotics empirically and would like patient transferred to [facility] for admission and they will plan to stent him in the morning   Problem List / ED Course / Critical interventions / Medication management  Patient comes in with rash and some confusion.  Recently given Bactrim for UTI has not taken the last pill due to rash started last night diffusely on his body that is very itchy.  There is no skin sloughing, no bulla, no mucosal involvement, the erythema is blanching.  He had Benadryl prior to arrival.  The rash is maculopapular.  I have extremely low suspicion for Stevens-Johnson syndrome at this time.  Patient unfortunately has been slightly tachycardic and had temperature of 100.4 last night.  Empirically given antibiotics due to indwelling Foley and high risk for infection.  Lactate was slightly elevated as well.  Patient being given fluids.  He is found to have AKI on his labs.  Potassium slightly elevated, CT abdomen pelvis ordered and shows 6 mm right UVJ stone. Is not hypotensive, lactate is under 4 no need for full sepsis fluid bolus  I ordered medication including dilaudid  for pain, NS for mild lactic acidosis  Reevaluation of the patient after these medicines showed that the patient improved I have reviewed the patients home medicines and have made adjustments as needed       Amount and/or Complexity of Data Reviewed Labs: ordered. Radiology: ordered. ECG/medicine tests: ordered.  Risk Prescription drug  management.  Hospitalist will plan to admit MS she feels needs transferred for dermatology, signed out to oncoming team       Final Clinical Impression(s) / ED Diagnoses Final diagnoses:  AKI (acute kidney injury) ([place])  Right ureteral stone  Urinary tract infection associated with indwelling urethral catheter, initial encounter [facility])    Rx / [place] Orders ED Discharge Orders     None         Darci Current 03/29/22 2132    Deno Etienne, DO 03/30/22 1457

## 2022-03-29 NOTE — Progress Notes (Addendum)
Pharmacy Antibiotic Note  Troy Randall is a 62 y.o. male admitted on 03/29/2022 with sepsis.  Pharmacy has been consulted for cefepime and vancomycin dosing. Patient with significant AKI, Scr: 2.41. Baseline is 0.6 in 2023.   Plan: Cefepime 2g 12H.  S/p 1x 2000mg  of vancomycin, intermittent dosing for now given AKI.  Follow culture data for de-escalation.  Monitor renal function for scheduled vancomycin regimen.   Height: 6' (182.9 cm) Weight: 102.5 kg (225 lb 15.5 oz) IBW/kg (Calculated) : 77.6  Temp (24hrs), Avg:97.9 F (36.6 C), Min:97.9 F (36.6 C), Max:97.9 F (36.6 C)  Recent Labs  Lab 03/29/22 1615 03/29/22 1618  WBC  --  7.5  CREATININE  --  2.41*  LATICACIDVEN 2.9*  --     Estimated Creatinine Clearance: 40.9 mL/min (A) (by C-G formula based on SCr of 2.41 mg/dL (H)).    Allergies  Allergen Reactions   Amoxicillin Hives   Thank you for allowing pharmacy to be a part of this patient's care.  Esmeralda Arthur, PharmD, BCCCP  03/29/2022 5:57 PM

## 2022-03-29 NOTE — Consult Note (Signed)
Initial Consultation Note   Patient: Troy Randall [id number] DOB: 1960/08/14 PCP: [name], MD DOA: 03/29/2022 DOS: the patient was seen and examined on 03/29/2022 Primary service: Davonna Belling, MD  Referring physician: Sherrye Payor, PA-EDP/[place], [place] Reason for consult: Rash, hurting all over, fever after taking Bactrim for 1 week.  Assessment/Plan:  Severe cutaneous adverse reaction with high suspicion for early [employer] syndrome, POA Sudden onset of diffuse rash after exposure to sulfamethoxazole x 1 week. Sulfamethoxazole has a strong association to Stevens-Johnson syndrome and toxic epidermal necrolysis. Bactrim has been discontinued from home med list, and added to list of allergy with high severity. No evidence of skin detachment at this time however concern it could progress to that if SJS is ruled in. No evidence of pulmonary involvement, negative chest x-ray. Supportive care and close monitoring  Sepsis secondary to complicated UTI with obstruction secondary to kidney stones, POA Failed outpatient therapy, Bactrim x 1 week. Presented with Tmax 100.4, lactic acidosis, diffuse rash, tachycardia, tachypnea, AKI. Received cefepime 2 g twice daily and IV vancomycin in the ED Obtain MRSA screening test, last MRSA screen test was positive on 11/03/2021. Follow urine culture and peripheral blood cultures x 2 peripherally for ID and sensitivities. Continue IV fluid hydration.  Acute metabolic encephalopathy, suspect multifactorial, secondary to sepsis versus others History of acute disseminated encephalomyelitis Per his wife he is not at his baseline mentation Treat underlying conditions Reorient as needed, fall precautions, aspiration precautions, delirium precautions.  AKI, suspect multifactorial prerenal from poor oral intake, dehydration and postrenal from kidney stone related obstruction Baseline creatinine appears to be 0.6 with GFR greater  than 60 Presented with creatinine of 2.41 with GFR of 30. Closely monitor urine output with strict I's and O's. CT abdomen and pelvis without contrast showed findings suggestive of bilateral nephrolithiasis, left UVJ 6 mm stone with obstruction.  Hyperkalemia in the setting of acute renal insufficiency Presented with potassium of 5.4 with AKI Lokelma 10 g twice daily x 2 doses. Repeat renal function test in the morning  Chronic normocytic anemia in the setting of chronic disease Hemoglobin 10.2, at baseline. Monitor H&H  History of chronic choledocholithiasis status post biliary drain placement, cholecystostomy tube  Chronic urinary retention status post Foley catheter exchange on 03/29/2022 in the ED Per his wife at bedside his Foley catheter is exchanged every 4 weeks. Monitor urine output     [name] will continue to follow the patient.  Thank you for allowing [place] to participate in the care of this patient.  HPI: Troy Randall is a 62 y.o. male with past medical history of type 2 diabetes, hypertension, hyperlipidemia, history of SVT, MS, history of GI bleed, anemia of chronic disease, refractory UTI, nephrolithiasis, history of chronic choledocholithiasis status post biliary drain placement/cholecystostomy tube placement, chronic urinary retention with indwelling Foley catheter, history of acute disseminated encephalomyelitis, who presented to [facility] ED due to sudden onset of a diffuse rash that started yesterday around noon, associated with hurting all over, and a fever.    The patient was started on Bactrim a week ago in the outpatient setting, for recurrent urinary tract infection and was compliant with the medication, has 1 pill left to take in order to complete the course of his antibiotic.  Per his wife at bedside, he had a sudden eruption of a diffused rash all over his body yesterday.  She brought him in for further evaluation.  While in the ED, the patient was noted to be  febrile  with Tmax 100.4.  UA was positive for pyuria.  CT abdomen and pelvis without contrast showed bilateral nephrolithiasis, left UVJ 6 mm stone with obstruction.  Right kidney cyst.  Right upper quadrant percutaneous drain.  No fluid collection identified.  Anterior abdominal postop changes status post hernia repair with a small residual abdominal wall defect.    On physical exam, the patient is confused and is hurting all over.  Initial lactic acid 2.9, repeat 2.2 after IV fluid bolus NS 1 L x 1 and IV antibiotics IV vancomycin and IV cefepime initiation.  EDP discussed the case with urology who recommended admission by hospitalist service to [facility] for stent placement.  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    Added Bactrim to list of allergies with high severity.  The patient might require skin biopsy to exclude other disorder that may mimic Stevens-Johnson syndrome.  Chest x-ray was obtained.  It was non acute which was reassuring.  We will continue to see the patient in consultation, closely monitor, and treat as indicated.    Review of Systems: As mentioned in the history of present illness. All other systems reviewed and are negative. Past Medical History:  Diagnosis Date   Anemia    Arthritis    CVA (cerebral vascular accident) ([place])    last CVA 04/2021   Diabetes mellitus without complication ([place])    type 2   Hyperlipidemia    Hypertension    Kidney stones    Past Surgical History:  Procedure Laterality Date   ANKLE SURGERY     BIOPSY  09/10/2021   Procedure: BIOPSY;  Surgeon: Carol Ada, MD;  Location: [facility] ENDOSCOPY;  Service: Gastroenterology;;   BIOPSY  10/07/2021   Procedure: BIOPSY;  Surgeon: Carol Ada, MD;   Location: [facility] ENDOSCOPY;  Service: Gastroenterology;;   COLONOSCOPY N/A 10/07/2021   Procedure: COLONOSCOPY;  Surgeon: Carol Ada, MD;  Location: [place];  Service: Gastroenterology;  Laterality: N/A;   COLONOSCOPY WITH PROPOFOL N/A 09/06/2021   Procedure: COLONOSCOPY WITH PROPOFOL;  Surgeon: Harvel Quale, MD;  [facility];  Service: Gastroenterology;  Laterality: N/A;   ENTEROSCOPY N/A 09/10/2021   Procedure: ENTEROSCOPY;  Surgeon: Carol Ada, MD;  Location: [place];  Service: Gastroenterology;  Laterality: N/A;   ENTEROSCOPY  09/06/2021   Procedure: ENTEROSCOPY;  Surgeon: Harvel Quale, MD;  [facility];  Service: Gastroenterology;;   ESOPHAGOGASTRODUODENOSCOPY (EGD) WITH PROPOFOL  09/06/2021   Procedure: ESOPHAGOGASTRODUODENOSCOPY (EGD) WITH PROPOFOL;  Surgeon: Harvel Quale, MD;  [facility];  Service: Gastroenterology;;   [place] N/A 09/19/2021   Procedure: Beryle Quant;  Surgeon: Carol Ada, MD;  Location: [place];  Service: Gastroenterology;  Laterality: N/A;   FOREIGN BODY REMOVAL  09/19/2021   Procedure: FOREIGN BODY REMOVAL;  Surgeon: Carol Ada, MD;  Location: [facility] ENDOSCOPY;  Service: Gastroenterology;;   Freda Munro CAPSULE STUDY  09/06/2021   Procedure: GIVENS CAPSULE STUDY;  Surgeon: Harvel Quale, MD;  [facility];  Service: Gastroenterology;;   HERNIA REPAIR     umbilical x1 Incisional x1   INCISIONAL HERNIA REPAIR  04/13/2011   Procedure: LAPAROSCOPIC INCISIONAL HERNIA;  Surgeon: Jamesetta So, MD;  Location: [facility];  Service: General;  Laterality: N/A;  Recurrent Laparoscopic Incisional Herniorraphy with Mesh   IR ANGIOGRAM SELECTIVE EACH ADDITIONAL VESSEL  09/09/2021   IR ANGIOGRAM VISCERAL SELECTIVE  09/07/2021   IR ANGIOGRAM VISCERAL SELECTIVE  09/06/2021   IR ANGIOGRAM VISCERAL SELECTIVE  09/06/2021   IR CHOLANGIOGRAM EXISTING TUBE  11/06/2021   IR  EMBO ART  VEN HEMORR LYMPH EXTRAV  INC GUIDE ROADMAPPING  09/06/2021   IR EXCHANGE BILIARY DRAIN  10/24/2021   IR EXCHANGE BILIARY DRAIN  11/19/2021   IR EXCHANGE BILIARY DRAIN  01/19/2022   IR EXCHANGE BILIARY DRAIN  02/19/2022   IR GUIDED DRAIN W CATHETER PLACEMENT  09/07/2021   IR US GUIDE BX ASP/DRAIN  09/07/2021   IR US GUIDE VASC ACCESS RIGHT  09/07/2021   IR US GUIDE VASC ACCESS RIGHT  09/06/2021   KIDNEY STONE SURGERY     RADIOLOGY WITH ANESTHESIA N/A 03/10/2022   Procedure: MRI WITH ANESTHESIA OF BRAIN WITH AND WITHOUT CONTRAST;  Surgeon: Radiologist, Medication, MD;  Location: [place];  Service: Radiology;  Laterality: N/A;   Social History:  reports that he has never smoked. He has quit using smokeless tobacco.  His smokeless tobacco use included snuff. He reports that he does not drink alcohol and does not use drugs.  Allergies  Allergen Reactions   Bactrim [Sulfamethoxazole-Trimethoprim] Rash   Amoxicillin Hives    Family History  Problem Relation Age of Onset   Heart failure Mother    Diabetes Father    Cancer Other    Heart attack Other    Anesthesia problems Neg Hx    Hypotension Neg Hx    Malignant hyperthermia Neg Hx    Pseudochol deficiency Neg Hx    Colon cancer Neg Hx    Inflammatory bowel disease Neg Hx     Prior to Admission medications   Medication Sig Start Date End Date Taking? Authorizing Provider  sulfamethoxazole-trimethoprim (BACTRIM DS) 800-160 MG tablet Take 1 tablet by mouth every 12 (twelve) hours. 03/20/22  Yes McKenzie, Candee Furbish, MD  acetaminophen (TYLENOL) 325 MG tablet Take 1-2 tablets (325-650 mg total) by mouth every 4 (four) hours as needed for mild pain. 11/26/21   Setzer, Edman Circle, PA-C  ascorbic acid (VITAMIN C) 500 MG tablet Take 500 mg by mouth daily.    [provider]  BD VEO INSULIN SYRINGE U/F 31G X 15/64" 1 ML MISC  USE AS DIRECTED 06/08/17   Hagler, Apolonio Schneiders, MD  Finerenone (KERENDIA) 20 MG TABS Take 20 mg by mouth daily.    [provider]  glucose blood (ONETOUCH VERIO) test strip TEST twice a day 05/04/17   Caren Macadam, MD  Insulin Pen Needle (NOVOTWIST) 32G X 5 MM MISC Use two daily to inject Victoza and Toujeo. 04/25/15   Elayne Snare, MD  methocarbamol (ROBAXIN) 500 MG tablet Take 1 tablet (500 mg total) by mouth every 6 (six) hours as needed for muscle spasms. Patient taking differently: Take 500 mg by mouth at bedtime. 11/26/21   Setzer, Edman Circle, PA-C  metoprolol tartrate (LOPRESSOR) 25 MG tablet Take 1 tablet (25 mg total) by mouth 2 (two) times daily. 11/26/21   Setzer, Edman Circle, PA-C  ONETOUCH DELICA LANCETS 99991111 MISC Use to check blood sugar once a day dx code E11.65 11/21/14   Elayne Snare, MD  pantoprazole (PROTONIX) 40 MG tablet Take 1 tablet (40 mg total) by mouth daily. 11/27/21   Setzer, Edman Circle, PA-C  Probiotic Product (PROBIOTIC PO) Take 8.5 mg by mouth  daily.    [provider]  rosuvastatin (CRESTOR) 40 MG tablet Take 1 tablet (40 mg total) by mouth daily. 04/29/21   Little Ishikawa, MD  tamsulosin (FLOMAX) 0.4 MG CAPS capsule Take 1 capsule (0.4 mg total) by mouth daily after supper. Patient not taking: Reported on 03/05/2022 11/26/21   Barbie Banner, PA-C  traZODone (DESYREL) 50 MG tablet Take 0.5-1 tablets (25-50 mg total) by mouth at bedtime as needed for sleep. Patient taking differently: Take 150 mg by mouth at bedtime. 11/26/21   Setzer, Edman Circle, PA-C  TRESIBA FLEXTOUCH 200 UNIT/ML FlexTouch Pen Inject under skin 15 units twice a day. Patient taking differently: Inject 16 Units into the skin 2 (two) times daily. If Blood glucose is over 129 11/26/21   Barbie Banner, [place]    Physical Exam: Vitals:   03/29/22 1620 03/29/22 1700 03/29/22 2100 03/29/22 2143  BP:  (!) 130/98  111/81  Pulse: 99 (!) 104 (!) 113 (!) 113  Resp: (!) 28 20 (!) 21 20  Temp:    97.8 F (36.6 C)  TempSrc:    Oral  SpO2: 100% 100% 98% 100%  Weight:      Height:      Frail-appearing,  uncomfortable, confused when aroused due to diffuse pain.  Somnolent but arouses to voices. Tachycardic with no rubs or gallops. Clear to auscultation no wheezes or rales.  Poor inspiratory effort. Right upper quadrant biliary drain in place.  Bowel sounds present. Diffuse rash nonblanching. No lower extremity edema. Mood is inappropriate for condition setting due to confusion.  Data Reviewed:  Data reviewed as stated above in the HPI. Lactic acid 2.9, 2.2, sodium 132, potassium 5.4, BUN 25, creatinine 2.41, albumin 3.4.  Family Communication: Updated patient's wife at bedside.  All questions answered to the best of my ability.  Primary team communication: Discussed with EDP, Amedeo Gory, [place] and [place], [place].  Recommended transfer to a tertiary center that has dermatology service available.  Thank you very much for involving [place] to participate in the care of your patient.  Will continue to follow the patient along with you.   Time: 85 minutes.   Author: Kayleen Memos, DO 03/29/2022 11:00 PM  For on call review [email or web address].

## 2022-03-29 NOTE — ED Notes (Signed)
Pt unable to tolerate changing out of his foley at this time.

## 2022-03-29 NOTE — ED Triage Notes (Signed)
Patient  BIB [facility] from home w/ c/o full body rash after starting abx for UTI treatment. Abx was started last Friday and rash appeared yesterday. It is full body but particularly worse on the R arm. Wife tried to treat with benadryl yesterday which helped with itching, EMS gave additional 50 mg of IV benadryl during transport. Denies shortness of breath. Patient also with R nephrostomy tube and wife has noticed leaking around the site and she is frequently having to change the guaze. VSS.

## 2022-03-29 NOTE — H&P (Signed)
NAME:  Troy Randall, MRN:  [id number], DOB:  12-19-1960, LOS: 0 ADMISSION DATE:  03/29/2022, CONSULTATION DATE:  3/24 REFERRING MD:  Dr. Nevada Crane, CHIEF COMPLAINT:  Rash   History of Present Illness:  Patient is a 62 year old male with pertinent PMH T2DM, HTN, HLD, SVT, MS, refractory UTI, urinary retention with chronic foley, previous GI bleed s/p IR coild of jejunal branches of SMA, Cholecystitis s/p chole drain, acute disseminated encephalomyelitis presents to [facility] ED on 3/24 sudden onset rash.  Patient states he had a rash that appeared suddenly on 3/23 around noon associated with fever and pain.  States the rash was all over his body. Tried benadryl which helped with itching. Of note patient was recently started on Bactrim on 3/15 for UTI. Wife also noticed leaking around cholecystostomy tube when flushing. Patient came to [facility] ED on 3/24 for further eval.  On arrival to [facility] ED, patient febrile 100.4 F.  Diffuse papular rash  appreciated to both arms and legs, torso and face. No blisters or oral lesions appreciated; no skin sloughing. Vital stable and on room air. No respiratory distress. Given Benadryl. CXR no acute findings. UA indicative of UTI. CT abd/pelvis showing b/l nephrolithiasis; left UVJ 6 mm stone with obstruction; right kidney cyst; RUQ PERC drain in place with no fluid collection.  Urology consulted recommending IV abx and IV fluids. Would like patient transferred to [facility] and plan for stenting in am.  Cultures obtained and started on IV fluids.  [facility] consulted for admission but deferred due to possible early onset SJS.  PCCM consulted.  Pertinent ED labs: LA 2.9 then 2.2, NA 132, K5.4, creat 2.41, AG 16, WBC 7.5  Pertinent  Medical History   Past Medical History:  Diagnosis Date   Anemia    Arthritis    CVA (cerebral vascular accident) ([place])    last CVA 04/2021   Diabetes mellitus without complication ([place])    type 2   Hyperlipidemia    Hypertension    Kidney stones       Significant Hospital Events: Including procedures, antibiotic start and stop dates in addition to other pertinent events     Interim History / Subjective:  ***  Objective   Blood pressure 111/81, pulse (!) 113, temperature 97.8 F (36.6 C), temperature source Oral, resp. rate 20, height 6' (1.829 m), weight 102.5 kg, SpO2 100 %.        Intake/Output Summary (Last 24 hours) at 03/29/2022 2232 Last data filed at 03/29/2022 2107 Gross per 24 hour  Intake 1051.89 ml  Output --  Net 1051.89 ml   Filed Weights   03/29/22 1450  Weight: 102.5 kg    Examination: General: *** HENT: *** Lungs: *** Cardiovascular: *** Abdomen: *** Extremities: *** Neuro: *** GU: ***  Resolved Hospital Problem list   ***  Assessment & Plan:  Diffuse maculopapular rash  Sepsis UTI 6 mm R UVJ stone   AKI Plan: -  Best Practice (right click and "Reselect all SmartList Selections" daily)   Diet/type: {diet type:25684} DVT prophylaxis: {anticoagulation (Optional):25687} GI prophylaxis: [id number] Lines: {Central Venous Access:25771} Foley:  {Central Venous Access:25691} Code Status:  {Code Status:26939} Last date of multidisciplinary goals of care discussion [***]  Labs   CBC: Recent Labs  Lab 03/29/22 1618  WBC 7.5  NEUTROABS 5.5  HGB 13.5  HCT 41.1  MCV 85.4  PLT 123XX123    Basic Metabolic Panel: Recent Labs  Lab 03/29/22 1618  NA 132*  K 5.4*  CL 94*  CO2 22  GLUCOSE 118*  BUN 25*  CREATININE 2.41*  CALCIUM 9.5   GFR: Estimated Creatinine Clearance: 40.9 mL/min (A) (by C-G formula based on SCr of 2.41 mg/dL (H)). Recent Labs  Lab 03/29/22 1615 03/29/22 1618 03/29/22 2105  WBC  --  7.5  --   LATICACIDVEN 2.9*  --  2.2*    Liver Function Tests: Recent Labs  Lab 03/29/22 1618  AST 41  ALT 35  ALKPHOS 63  BILITOT 0.9  PROT 7.4  ALBUMIN 3.4*   No results for input(s): "LIPASE", "AMYLASE" in the last 168 hours. No results for input(s):  "AMMONIA" in the last 168 hours.  ABG    Component Value Date/Time   PHART 7.476 (H) 10/03/2021 0546   PCO2ART 36.1 10/03/2021 0546   PO2ART 171 (H) 10/03/2021 0546   HCO3 26.1 10/03/2021 0546   TCO2 27 10/03/2021 0546   ACIDBASEDEF 5.0 (H) 09/08/2021 1907   O2SAT 100 10/03/2021 0546     Coagulation Profile: Recent Labs  Lab 03/29/22 1618  INR 1.1    Cardiac Enzymes: No results for input(s): "CKTOTAL", "CKMB", "CKMBINDEX", "TROPONINI" in the last 168 hours.  HbA1C: Hgb A1c MFr Bld  Date/Time Value Ref Range Status  10/27/2021 03:38 AM 5.1 4.8 - 5.6 % Final    Comment:    (NOTE) Pre diabetes:          5.7%-6.4%  Diabetes:              >6.4%  Glycemic control for   <7.0% adults with diabetes   04/29/2021 05:02 AM 6.2 (H) 4.8 - 5.6 % Final    Comment:    (NOTE) Pre diabetes:          5.7%-6.4%  Diabetes:              >6.4%  Glycemic control for   <7.0% adults with diabetes     CBG: No results for input(s): "GLUCAP" in the last 168 hours.  Review of Systems:   ***  Past Medical History:  He,  has a past medical history of Anemia, Arthritis, CVA (cerebral vascular accident) ([place]), Diabetes mellitus without complication ([place]), Hyperlipidemia, Hypertension, and Kidney stones.   Surgical History:   Past Surgical History:  Procedure Laterality Date   ANKLE SURGERY     BIOPSY  09/10/2021   Procedure: BIOPSY;  Surgeon: Carol Ada, MD;  Location: [facility] ENDOSCOPY;  Service: Gastroenterology;;   BIOPSY  10/07/2021   Procedure: BIOPSY;  Surgeon: Carol Ada, MD;  Location: [facility] ENDOSCOPY;  Service: Gastroenterology;;   COLONOSCOPY N/A 10/07/2021   Procedure: COLONOSCOPY;  Surgeon: Carol Ada, MD;  Location: [place];  Service: Gastroenterology;  Laterality: N/A;   COLONOSCOPY WITH PROPOFOL N/A 09/06/2021   Procedure: COLONOSCOPY WITH PROPOFOL;  Surgeon: Harvel Quale, MD;  [facility];  Service: Gastroenterology;  Laterality: N/A;    ENTEROSCOPY N/A 09/10/2021   Procedure: ENTEROSCOPY;  Surgeon: Carol Ada, MD;  Location: [place];  Service: Gastroenterology;  Laterality: N/A;   ENTEROSCOPY  09/06/2021   Procedure: ENTEROSCOPY;  Surgeon: Harvel Quale, MD;  [facility];  Service: Gastroenterology;;   ESOPHAGOGASTRODUODENOSCOPY (EGD) WITH PROPOFOL  09/06/2021   Procedure: ESOPHAGOGASTRODUODENOSCOPY (EGD) WITH PROPOFOL;  Surgeon: Harvel Quale, MD;  [facility];  Service: Gastroenterology;;   [place] N/A 09/19/2021   Procedure: Beryle Quant;  Surgeon: Carol Ada, MD;  Location: [place];  Service: Gastroenterology;  Laterality: N/A;  FOREIGN BODY REMOVAL  09/19/2021   Procedure: FOREIGN BODY REMOVAL;  Surgeon: Carol Ada, MD;  Location: [place];  Service: Gastroenterology;;   Freda Munro CAPSULE STUDY  09/06/2021   Procedure: GIVENS CAPSULE STUDY;  Surgeon: Harvel Quale, MD;  [facility];  Service: Gastroenterology;;   HERNIA REPAIR     umbilical x1 Incisional x1   INCISIONAL HERNIA REPAIR  04/13/2011   Procedure: LAPAROSCOPIC INCISIONAL HERNIA;  Surgeon: Jamesetta So, MD;  Location: [facility];  Service: General;  Laterality: N/A;  Recurrent Laparoscopic Incisional Herniorraphy with Mesh   IR ANGIOGRAM SELECTIVE EACH ADDITIONAL VESSEL  09/09/2021   IR ANGIOGRAM VISCERAL SELECTIVE  09/07/2021   IR ANGIOGRAM VISCERAL SELECTIVE  09/06/2021   IR ANGIOGRAM VISCERAL SELECTIVE  09/06/2021   IR CHOLANGIOGRAM EXISTING TUBE  11/06/2021   IR EMBO ART  VEN HEMORR LYMPH EXTRAV  INC GUIDE ROADMAPPING  09/06/2021   IR EXCHANGE BILIARY DRAIN  10/24/2021   IR EXCHANGE BILIARY DRAIN  11/19/2021   IR EXCHANGE BILIARY DRAIN  01/19/2022   IR EXCHANGE BILIARY DRAIN  02/19/2022   IR GUIDED DRAIN W CATHETER PLACEMENT  09/07/2021   IR US GUIDE BX ASP/DRAIN  09/07/2021   IR US GUIDE VASC ACCESS RIGHT  09/07/2021   IR US GUIDE VASC ACCESS RIGHT  09/06/2021    KIDNEY STONE SURGERY     RADIOLOGY WITH ANESTHESIA N/A 03/10/2022   Procedure: MRI WITH ANESTHESIA OF BRAIN WITH AND WITHOUT CONTRAST;  Surgeon: Radiologist, Medication, MD;  Location: [place];  Service: Radiology;  Laterality: N/A;     Social History:   reports that he has never smoked. He has quit using smokeless tobacco.  His smokeless tobacco use included snuff. He reports that he does not drink alcohol and does not use drugs.   Family History:  His family history includes Cancer in an other family member; Diabetes in his father; Heart attack in an other family member; Heart failure in his mother. There is no history of Anesthesia problems, Hypotension, Malignant hyperthermia, Pseudochol deficiency, Colon cancer, or Inflammatory bowel disease.   Allergies Allergies  Allergen Reactions   Bactrim [Sulfamethoxazole-Trimethoprim] Rash   Amoxicillin Hives     Home Medications  Prior to Admission medications   Medication Sig Start Date End Date Taking? Authorizing Provider  acetaminophen (TYLENOL) 325 MG tablet Take 1-2 tablets (325-650 mg total) by mouth every 4 (four) hours as needed for mild pain. 11/26/21   Setzer, Edman Circle, PA-C  ascorbic acid (VITAMIN C) 500 MG tablet Take 500 mg by mouth daily.    [provider]  BD VEO INSULIN SYRINGE U/F 31G X 15/64" 1 ML MISC  USE AS DIRECTED 06/08/17   Hagler, Apolonio Schneiders, MD  Finerenone (KERENDIA) 20 MG TABS Take 20 mg by mouth daily.    [provider]  glucose blood (ONETOUCH VERIO) test strip TEST twice a day 05/04/17   Caren Macadam, MD  Insulin Pen Needle (NOVOTWIST) 32G X 5 MM MISC Use two daily to inject Victoza and Toujeo. 04/25/15   Elayne Snare, MD  methocarbamol (ROBAXIN) 500 MG tablet Take 1 tablet (500 mg total) by mouth every 6 (six) hours as needed for muscle spasms. Patient taking differently: Take 500 mg by mouth at bedtime. 11/26/21   Setzer, Edman Circle, PA-C  metoprolol tartrate (LOPRESSOR) 25 MG tablet Take 1 tablet (25  mg total) by mouth 2 (two) times daily. 11/26/21   Setzer, Edman Circle, PA-C  ONETOUCH DELICA LANCETS [id number] MISC  Use to check blood sugar once a day dx code E11.65 11/21/14   Elayne Snare, MD  pantoprazole (PROTONIX) 40 MG tablet Take 1 tablet (40 mg total) by mouth daily. 11/27/21   Setzer, Edman Circle, PA-C  Probiotic Product (PROBIOTIC PO) Take 8.5 mg by mouth daily.    [provider]  rosuvastatin (CRESTOR) 40 MG tablet Take 1 tablet (40 mg total) by mouth daily. 04/29/21   Little Ishikawa, MD  tamsulosin (FLOMAX) 0.4 MG CAPS capsule Take 1 capsule (0.4 mg total) by mouth daily after supper. Patient not taking: Reported on 03/05/2022 11/26/21   Barbie Banner, PA-C  traZODone (DESYREL) 50 MG tablet Take 0.5-1 tablets (25-50 mg total) by mouth at bedtime as needed for sleep. Patient taking differently: Take 150 mg by mouth at bedtime. 11/26/21   Setzer, Edman Circle, PA-C  TRESIBA FLEXTOUCH 200 UNIT/ML FlexTouch Pen Inject under skin 15 units twice a day. Patient taking differently: Inject 16 Units into the skin 2 (two) times daily. If Blood glucose is over 129 11/26/21   Setzer, Edman Circle, PA-C     Critical care time: 45 minutes***    JD Rexene Agent [facility] 03/29/2022, 10:32 PM  Please see [email or web address] for pager details.  From 7A-7P if no response, please call [phone]. After hours, please call ELink [phone].

## 2022-03-30 ENCOUNTER — Inpatient Hospital Stay (HOSPITAL_COMMUNITY): Payer: Medicare HMO

## 2022-03-30 ENCOUNTER — Inpatient Hospital Stay (HOSPITAL_COMMUNITY): Payer: Medicare HMO | Admitting: Anesthesiology

## 2022-03-30 ENCOUNTER — Encounter (HOSPITAL_COMMUNITY)
Admission: EM | Disposition: A | Discharge status: Home Health | Payer: Self-pay | Source: Home / Self Care | Attending: Family Medicine

## 2022-03-30 ENCOUNTER — Ambulatory Visit: Payer: Medicare HMO | Admitting: Neurology

## 2022-03-30 ENCOUNTER — Encounter (HOSPITAL_COMMUNITY): Payer: Self-pay | Admitting: Pulmonary Disease

## 2022-03-30 DIAGNOSIS — R9082 White matter disease, unspecified: Secondary | ICD-10-CM | POA: Diagnosis not present

## 2022-03-30 DIAGNOSIS — G9341 Metabolic encephalopathy: Secondary | ICD-10-CM | POA: Diagnosis not present

## 2022-03-30 DIAGNOSIS — E119 Type 2 diabetes mellitus without complications: Secondary | ICD-10-CM | POA: Diagnosis not present

## 2022-03-30 DIAGNOSIS — Y738 Miscellaneous gastroenterology and urology devices associated with adverse incidents, not elsewhere classified: Secondary | ICD-10-CM | POA: Diagnosis not present

## 2022-03-30 DIAGNOSIS — D638 Anemia in other chronic diseases classified elsewhere: Secondary | ICD-10-CM | POA: Diagnosis not present

## 2022-03-30 DIAGNOSIS — R6520 Severe sepsis without septic shock: Secondary | ICD-10-CM | POA: Diagnosis not present

## 2022-03-30 DIAGNOSIS — L270 Generalized skin eruption due to drugs and medicaments taken internally: Secondary | ICD-10-CM | POA: Diagnosis not present

## 2022-03-30 DIAGNOSIS — T83511A Infection and inflammatory reaction due to indwelling urethral catheter, initial encounter: Secondary | ICD-10-CM | POA: Diagnosis not present

## 2022-03-30 DIAGNOSIS — N281 Cyst of kidney, acquired: Secondary | ICD-10-CM | POA: Diagnosis not present

## 2022-03-30 DIAGNOSIS — Y846 Urinary catheterization as the cause of abnormal reaction of the patient, or of later complication, without mention of misadventure at the time of the procedure: Secondary | ICD-10-CM | POA: Diagnosis not present

## 2022-03-30 DIAGNOSIS — E871 Hypo-osmolality and hyponatremia: Secondary | ICD-10-CM | POA: Diagnosis not present

## 2022-03-30 DIAGNOSIS — Z794 Long term (current) use of insulin: Secondary | ICD-10-CM

## 2022-03-30 DIAGNOSIS — N179 Acute kidney failure, unspecified: Secondary | ICD-10-CM | POA: Diagnosis not present

## 2022-03-30 DIAGNOSIS — E872 Acidosis, unspecified: Secondary | ICD-10-CM | POA: Diagnosis not present

## 2022-03-30 DIAGNOSIS — N136 Pyonephrosis: Secondary | ICD-10-CM | POA: Diagnosis not present

## 2022-03-30 DIAGNOSIS — E785 Hyperlipidemia, unspecified: Secondary | ICD-10-CM | POA: Diagnosis not present

## 2022-03-30 DIAGNOSIS — E875 Hyperkalemia: Secondary | ICD-10-CM | POA: Diagnosis not present

## 2022-03-30 DIAGNOSIS — L511 Stevens-Johnson syndrome: Secondary | ICD-10-CM | POA: Diagnosis not present

## 2022-03-30 DIAGNOSIS — T370X5A Adverse effect of sulfonamides, initial encounter: Secondary | ICD-10-CM | POA: Diagnosis present

## 2022-03-30 DIAGNOSIS — I10 Essential (primary) hypertension: Secondary | ICD-10-CM

## 2022-03-30 DIAGNOSIS — K8050 Calculus of bile duct without cholangitis or cholecystitis without obstruction: Secondary | ICD-10-CM | POA: Diagnosis not present

## 2022-03-30 DIAGNOSIS — R21 Rash and other nonspecific skin eruption: Secondary | ICD-10-CM | POA: Diagnosis not present

## 2022-03-30 DIAGNOSIS — Z434 Encounter for attention to other artificial openings of digestive tract: Secondary | ICD-10-CM | POA: Diagnosis not present

## 2022-03-30 DIAGNOSIS — E669 Obesity, unspecified: Secondary | ICD-10-CM | POA: Diagnosis not present

## 2022-03-30 DIAGNOSIS — N201 Calculus of ureter: Secondary | ICD-10-CM

## 2022-03-30 DIAGNOSIS — E1165 Type 2 diabetes mellitus with hyperglycemia: Secondary | ICD-10-CM | POA: Diagnosis not present

## 2022-03-30 DIAGNOSIS — L89151 Pressure ulcer of sacral region, stage 1: Secondary | ICD-10-CM | POA: Diagnosis not present

## 2022-03-30 DIAGNOSIS — E876 Hypokalemia: Secondary | ICD-10-CM | POA: Diagnosis not present

## 2022-03-30 DIAGNOSIS — A4150 Gram-negative sepsis, unspecified: Secondary | ICD-10-CM | POA: Diagnosis not present

## 2022-03-30 DIAGNOSIS — K819 Cholecystitis, unspecified: Secondary | ICD-10-CM | POA: Diagnosis not present

## 2022-03-30 DIAGNOSIS — K8060 Calculus of gallbladder and bile duct with cholecystitis, unspecified, without obstruction: Secondary | ICD-10-CM | POA: Diagnosis not present

## 2022-03-30 DIAGNOSIS — N202 Calculus of kidney with calculus of ureter: Secondary | ICD-10-CM | POA: Diagnosis not present

## 2022-03-30 DIAGNOSIS — Z8673 Personal history of transient ischemic attack (TIA), and cerebral infarction without residual deficits: Secondary | ICD-10-CM | POA: Diagnosis not present

## 2022-03-30 DIAGNOSIS — G0400 Acute disseminated encephalitis and encephalomyelitis, unspecified: Secondary | ICD-10-CM | POA: Diagnosis not present

## 2022-03-30 DIAGNOSIS — G9389 Other specified disorders of brain: Secondary | ICD-10-CM | POA: Diagnosis not present

## 2022-03-30 HISTORY — PX: CYSTOSCOPY/URETEROSCOPY/HOLMIUM LASER/STENT PLACEMENT: SHX6546

## 2022-03-30 LAB — CBC
HCT: 41.5 % (ref 39.0–52.0)
Hemoglobin: 13.4 g/dL (ref 13.0–17.0)
MCH: 27.7 pg (ref 26.0–34.0)
MCHC: 32.3 g/dL (ref 30.0–36.0)
MCV: 85.9 fL (ref 80.0–100.0)
Platelets: 181 10*3/uL (ref 150–400)
RBC: 4.83 MIL/uL (ref 4.22–5.81)
RDW: 13.3 % (ref 11.5–15.5)
WBC: 6.1 10*3/uL (ref 4.0–10.5)
nRBC: 0 % (ref 0.0–0.2)

## 2022-03-30 LAB — COMPREHENSIVE METABOLIC PANEL
ALT: 33 U/L (ref 0–44)
AST: 35 U/L (ref 15–41)
Albumin: 2.8 g/dL — ABNORMAL LOW (ref 3.5–5.0)
Alkaline Phosphatase: 58 U/L (ref 38–126)
Anion gap: 13 (ref 5–15)
BUN: 22 mg/dL — ABNORMAL HIGH (ref 6–20)
CO2: 19 mmol/L — ABNORMAL LOW (ref 22–32)
Calcium: 8.6 mg/dL — ABNORMAL LOW (ref 8.9–10.3)
Chloride: 99 mmol/L (ref 98–111)
Creatinine, Ser: 1.74 mg/dL — ABNORMAL HIGH (ref 0.61–1.24)
GFR, Estimated: 45 mL/min — ABNORMAL LOW (ref >60)
Glucose, Bld: 119 mg/dL — ABNORMAL HIGH (ref 70–99)
Potassium: 4.3 mmol/L (ref 3.5–5.1)
Sodium: 131 mmol/L — ABNORMAL LOW (ref 135–145)
Total Bilirubin: 1 mg/dL (ref 0.3–1.2)
Total Protein: 6.1 g/dL — ABNORMAL LOW (ref 6.5–8.1)

## 2022-03-30 LAB — CBC WITH DIFFERENTIAL/PLATELET
Abs Immature Granulocytes: 0.04 10*3/uL (ref 0.00–0.07)
Basophils Absolute: 0 10*3/uL (ref 0.0–0.1)
Basophils Relative: 0 %
Eosinophils Absolute: 0.3 10*3/uL (ref 0.0–0.5)
Eosinophils Relative: 4 %
HCT: 37 % — ABNORMAL LOW (ref 39.0–52.0)
Hemoglobin: 12 g/dL — ABNORMAL LOW (ref 13.0–17.0)
Immature Granulocytes: 1 %
Lymphocytes Relative: 8 %
Lymphs Abs: 0.6 10*3/uL — ABNORMAL LOW (ref 0.7–4.0)
MCH: 27.7 pg (ref 26.0–34.0)
MCHC: 32.4 g/dL (ref 30.0–36.0)
MCV: 85.5 fL (ref 80.0–100.0)
Monocytes Absolute: 0.3 10*3/uL (ref 0.1–1.0)
Monocytes Relative: 3 %
Neutro Abs: 6.6 10*3/uL (ref 1.7–7.7)
Neutrophils Relative %: 84 %
Platelets: 173 10*3/uL (ref 150–400)
RBC: 4.33 MIL/uL (ref 4.22–5.81)
RDW: 13.4 % (ref 11.5–15.5)
WBC: 7.8 10*3/uL (ref 4.0–10.5)
nRBC: 0 % (ref 0.0–0.2)

## 2022-03-30 LAB — BASIC METABOLIC PANEL
Anion gap: 16 — ABNORMAL HIGH (ref 5–15)
BUN: 22 mg/dL — ABNORMAL HIGH (ref 6–20)
CO2: 19 mmol/L — ABNORMAL LOW (ref 22–32)
Calcium: 8.8 mg/dL — ABNORMAL LOW (ref 8.9–10.3)
Chloride: 95 mmol/L — ABNORMAL LOW (ref 98–111)
Creatinine, Ser: 1.8 mg/dL — ABNORMAL HIGH (ref 0.61–1.24)
GFR, Estimated: 43 mL/min — ABNORMAL LOW (ref >60)
Glucose, Bld: 96 mg/dL (ref 70–99)
Potassium: 4.5 mmol/L (ref 3.5–5.1)
Sodium: 130 mmol/L — ABNORMAL LOW (ref 135–145)

## 2022-03-30 LAB — MAGNESIUM: Magnesium: 1.7 mg/dL (ref 1.7–2.4)

## 2022-03-30 LAB — GLUCOSE, CAPILLARY
Glucose-Capillary: 85 mg/dL (ref 70–99)
Glucose-Capillary: 116 mg/dL — ABNORMAL HIGH (ref 70–99)
Glucose-Capillary: 154 mg/dL — ABNORMAL HIGH (ref 70–99)
Glucose-Capillary: 175 mg/dL — ABNORMAL HIGH (ref 70–99)
Glucose-Capillary: 148 mg/dL — ABNORMAL HIGH (ref 70–99)
Glucose-Capillary: 217 mg/dL — ABNORMAL HIGH (ref 70–99)
Glucose-Capillary: 178 mg/dL — ABNORMAL HIGH (ref 70–99)

## 2022-03-30 LAB — PHOSPHORUS: Phosphorus: 4.3 mg/dL (ref 2.5–4.6)

## 2022-03-30 LAB — LACTIC ACID, PLASMA
Lactic Acid, Venous: 2 mmol/L (ref 0.5–1.9)
Lactic Acid, Venous: 1.3 mmol/L (ref 0.5–1.9)

## 2022-03-30 LAB — PROCALCITONIN: Procalcitonin: 0.31 ng/mL

## 2022-03-30 LAB — C-REACTIVE PROTEIN: CRP: 3.5 mg/dL — ABNORMAL HIGH (ref <1.0)

## 2022-03-30 LAB — SEDIMENTATION RATE: Sed Rate: 53 mm/hr — ABNORMAL HIGH (ref 0–16)

## 2022-03-30 LAB — MRSA NEXT GEN BY PCR, NASAL: MRSA by PCR Next Gen: DETECTED — AB

## 2022-03-30 LAB — CORTISOL: Cortisol, Plasma: 10.9 ug/dL

## 2022-03-30 SURGERY — CYSTOSCOPY/URETEROSCOPY/HOLMIUM LASER/STENT PLACEMENT
Anesthesia: General | Laterality: Left

## 2022-03-30 MED ORDER — FENTANYL CITRATE (PF) 100 MCG/2ML IJ SOLN
INTRAMUSCULAR | Status: AC
  Filled 2022-03-30: qty 2

## 2022-03-30 MED ORDER — ACETAMINOPHEN 325 MG PO TABS: 650.0000 mg | ORAL_TABLET | Freq: Four times a day (QID) | ORAL | Status: DC | PRN

## 2022-03-30 MED ORDER — LIDOCAINE HCL (PF) 2 % IJ SOLN
INTRAMUSCULAR | Status: AC
  Filled 2022-03-30: qty 5

## 2022-03-30 MED ORDER — DEXAMETHASONE SODIUM PHOSPHATE 10 MG/ML IJ SOLN
INTRAMUSCULAR | Status: AC
  Filled 2022-03-30: qty 1

## 2022-03-30 MED ORDER — ONDANSETRON HCL 4 MG/2ML IJ SOLN
INTRAMUSCULAR | Status: DC | PRN
  Administered 2022-03-30: 4 mg via INTRAVENOUS

## 2022-03-30 MED ORDER — EPHEDRINE SULFATE (PRESSORS) 50 MG/ML IJ SOLN
INTRAMUSCULAR | Status: DC | PRN
  Administered 2022-03-30: 5 mg via INTRAVENOUS

## 2022-03-30 MED ORDER — SODIUM CHLORIDE 0.9 % IR SOLN
Status: DC | PRN
  Administered 2022-03-30: 3000 mL via INTRAVESICAL

## 2022-03-30 MED ORDER — DOCUSATE SODIUM 100 MG PO CAPS: 100.0000 mg | ORAL_CAPSULE | Freq: Two times a day (BID) | ORAL | Status: DC | PRN

## 2022-03-30 MED ORDER — FAMOTIDINE IN NACL 20-0.9 MG/50ML-% IV SOLN
20.0000 mg | Freq: Two times a day (BID) | INTRAVENOUS | Status: DC
  Administered 2022-03-30 – 2022-03-31 (×3): 20 mg via INTRAVENOUS
  Filled 2022-03-30 (×4): qty 50

## 2022-03-30 MED ORDER — IOHEXOL 300 MG/ML  SOLN
INTRAMUSCULAR | Status: DC | PRN
  Administered 2022-03-30: 5 mL

## 2022-03-30 MED ORDER — SODIUM CHLORIDE 0.9 % IV SOLN: INTRAVENOUS | Status: DC | PRN

## 2022-03-30 MED ORDER — ORAL CARE MOUTH RINSE
15.0000 mL | OROMUCOSAL | Status: DC | PRN
  Administered 2022-03-30: 15 mL via OROMUCOSAL

## 2022-03-30 MED ORDER — DEXAMETHASONE SODIUM PHOSPHATE 10 MG/ML IJ SOLN
INTRAMUSCULAR | Status: DC | PRN
  Administered 2022-03-30: 10 mg via INTRAVENOUS

## 2022-03-30 MED ORDER — INSULIN ASPART 100 UNIT/ML IJ SOLN
0.0000 [IU] | INTRAMUSCULAR | Status: DC
  Administered 2022-03-30 (×2): 2 [IU] via SUBCUTANEOUS
  Administered 2022-03-30 – 2022-03-31 (×3): 3 [IU] via SUBCUTANEOUS
  Administered 2022-03-31: 1 [IU] via SUBCUTANEOUS
  Administered 2022-03-31 (×2): 3 [IU] via SUBCUTANEOUS
  Administered 2022-04-01: 1 [IU] via SUBCUTANEOUS
  Administered 2022-04-01: 2 [IU] via SUBCUTANEOUS

## 2022-03-30 MED ORDER — ONDANSETRON HCL 4 MG/2ML IJ SOLN
INTRAMUSCULAR | Status: AC
  Filled 2022-03-30: qty 2

## 2022-03-30 MED ORDER — POLYETHYLENE GLYCOL 3350 17 G PO PACK: 17.0000 g | PACK | Freq: Every day | ORAL | Status: DC | PRN

## 2022-03-30 MED ORDER — MAGNESIUM SULFATE 2 GM/50ML IV SOLN
2.0000 g | Freq: Once | INTRAVENOUS | Status: AC
  Administered 2022-03-30: 2 g via INTRAVENOUS
  Filled 2022-03-30: qty 50

## 2022-03-30 MED ORDER — MUPIROCIN 2 % EX OINT
1.0000 | TOPICAL_OINTMENT | Freq: Two times a day (BID) | CUTANEOUS | Status: DC
  Administered 2022-03-30 – 2022-04-03 (×8): 1 via NASAL
  Filled 2022-03-30 (×3): qty 22

## 2022-03-30 MED ORDER — FENTANYL CITRATE PF 50 MCG/ML IJ SOSY: 25.0000 ug | PREFILLED_SYRINGE | INTRAMUSCULAR | Status: DC | PRN

## 2022-03-30 MED ORDER — METHYLPREDNISOLONE SODIUM SUCC 125 MG IJ SOLR
80.0000 mg | Freq: Once | INTRAMUSCULAR | Status: AC
  Administered 2022-03-30: 80 mg via INTRAVENOUS
  Filled 2022-03-30: qty 2

## 2022-03-30 MED ORDER — LIDOCAINE HCL (CARDIAC) PF 100 MG/5ML IV SOSY
PREFILLED_SYRINGE | INTRAVENOUS | Status: DC | PRN
  Administered 2022-03-30: 50 mg via INTRAVENOUS

## 2022-03-30 MED ORDER — MIDAZOLAM HCL 2 MG/2ML IJ SOLN
INTRAMUSCULAR | Status: AC
  Filled 2022-03-30: qty 2

## 2022-03-30 MED ORDER — FENTANYL CITRATE (PF) 100 MCG/2ML IJ SOLN
INTRAMUSCULAR | Status: DC | PRN
  Administered 2022-03-30 (×2): 50 ug via INTRAVENOUS

## 2022-03-30 MED ORDER — PROPOFOL 10 MG/ML IV BOLUS
INTRAVENOUS | Status: DC | PRN
  Administered 2022-03-30: 100 mg via INTRAVENOUS

## 2022-03-30 MED ORDER — HEPARIN SODIUM (PORCINE) 5000 UNIT/ML IJ SOLN
5000.0000 [IU] | Freq: Three times a day (TID) | INTRAMUSCULAR | Status: DC
  Administered 2022-03-30 (×2): 5000 [IU] via SUBCUTANEOUS
  Filled 2022-03-30 (×2): qty 1

## 2022-03-30 MED ORDER — HYDROCERIN EX CREA
TOPICAL_CREAM | Freq: Two times a day (BID) | CUTANEOUS | Status: DC
  Administered 2022-03-30 – 2022-03-31 (×2): 1 via TOPICAL
  Filled 2022-03-30: qty 113

## 2022-03-30 MED ORDER — PROPOFOL 10 MG/ML IV BOLUS
INTRAVENOUS | Status: AC
  Filled 2022-03-30: qty 20

## 2022-03-30 MED ORDER — CHLORHEXIDINE GLUCONATE CLOTH 2 % EX PADS
6.0000 | MEDICATED_PAD | Freq: Every day | CUTANEOUS | Status: DC
  Administered 2022-04-01 – 2022-04-03 (×2): 6 via TOPICAL

## 2022-03-30 MED ORDER — DIPHENHYDRAMINE HCL 50 MG/ML IJ SOLN
25.0000 mg | Freq: Four times a day (QID) | INTRAMUSCULAR | Status: DC
  Administered 2022-03-30 – 2022-03-31 (×6): 25 mg via INTRAVENOUS
  Filled 2022-03-30 (×6): qty 1

## 2022-03-30 MED ORDER — ACETAMINOPHEN 650 MG RE SUPP: 650.0000 mg | Freq: Four times a day (QID) | RECTAL | Status: DC | PRN

## 2022-03-30 SURGICAL SUPPLY — 23 items
BAG URO CATCHER STRL LF (MISCELLANEOUS) ×2 IMPLANT
BASKET ZERO TIP NITINOL 2.4FR (BASKET) IMPLANT
BSKT STON RTRVL ZERO TP 2.4FR (BASKET) ×1
CATH URETL OPEN 5X70 (CATHETERS) ×2 IMPLANT
CLOTH BEACON ORANGE TIMEOUT ST (SAFETY) ×2 IMPLANT
EXTRACTOR STONE NITINOL NGAGE (UROLOGICAL SUPPLIES) IMPLANT
GLOVE SURG LX STRL 7.5 STRW (GLOVE) ×2 IMPLANT
GOWN SRG XL LVL 4 BRTHBL STRL (GOWNS) ×2 IMPLANT
GOWN STRL NON-REIN XL LVL4 (GOWNS) ×1
GUIDEWIRE STR DUAL SENSOR (WIRE) IMPLANT
GUIDEWIRE ZIPWRE .038 STRAIGHT (WIRE) ×2 IMPLANT
KIT TURNOVER KIT A (KITS) IMPLANT
LASER FIB FLEXIVA PULSE ID 365 (Laser) IMPLANT
MANIFOLD NEPTUNE II (INSTRUMENTS) ×2 IMPLANT
PACK CYSTO (CUSTOM PROCEDURE TRAY) ×2 IMPLANT
SHEATH NAVIGATOR HD 11/13X36 (SHEATH) IMPLANT
STENT URET 6FRX24 CONTOUR (STENTS) IMPLANT
STENT URET 6FRX26 CONTOUR (STENTS) IMPLANT
TRACTIP FLEXIVA PULS ID 200XHI (Laser) IMPLANT
TRACTIP FLEXIVA PULSE ID 200 (Laser) ×1
TRAY FOLEY MTR SLVR 16FR STAT (SET/KITS/TRAYS/PACK) IMPLANT
TUBING CONNECTING 10 (TUBING) ×2 IMPLANT
TUBING UROLOGY SET (TUBING) ×2 IMPLANT

## 2022-03-30 NOTE — Progress Notes (Signed)
Larrie Kass to be D/C'd  to [facility] long hospital by Care link, per MD order.  Discussed with the patient and all questions fully answered.  VSS, Skin clean, dry and intact without evidence of skin break down, no evidence of skin tears noted. Patient escorted via Care link to [facility] long, report given to Nurse,      Lorenza Evangelist [facility] 03/30/2022 8:56 AM

## 2022-03-30 NOTE — Progress Notes (Signed)
PT Cancellation Note  Patient Details Name: Troy Randall MRN: [id number] DOB: [birth year]   Cancelled Treatment:    Reason Eval/Treat Not Completed: Patient at procedure or test/unavailable - at procedure, transfer to Troy Randall, PT DPT Acute Rehabilitation Services Pager [phone]  Office [phone]    [place] 03/30/2022, 10:30 AM

## 2022-03-30 NOTE — Progress Notes (Signed)
SLP Cancellation Note  Patient Details Name: CHRISTOVAL HECKMANN MRN: [id number] DOB: Apr 03, 1960   Cancelled treatment:       Reason Eval/Treat Not Completed: Patient at procedure or test/unavailable  Sonia Baller, MA, CCC-SLP Speech Therapy

## 2022-03-30 NOTE — Transfer of Care (Signed)
Immediate Anesthesia Transfer of Care Note  Patient: Troy Randall  Procedure(s) Performed: CYSTOSCOPY/RETROGRADE/URETEROSCOPY/HOLMIUM LASER/STENT PLACEMENT (Left)  Patient Location: PACU  Anesthesia Type:General  Level of Consciousness: awake, alert , oriented, and patient cooperative  Airway & Oxygen Therapy: Patient Spontanous Breathing and Patient connected to face mask oxygen  Post-op Assessment: Report given to RN and Post -op Vital signs reviewed and stable  Post vital signs: Reviewed and stable  Last Vitals:  Vitals Value Taken Time  BP 125/83 03/30/22 1215  Temp    Pulse 81 03/30/22 1218  Resp 18 03/30/22 1218  SpO2 98 % 03/30/22 1218  Vitals shown include unvalidated device data.  Last Pain:  Vitals:   03/30/22 0728  TempSrc: Oral  PainSc:          Complications: No notable events documented.

## 2022-03-30 NOTE — Evaluation (Signed)
Clinical/Bedside Swallow Evaluation Patient Details  Name: Troy CLAVETTE MRN: [id number] Date of Birth: Jul 05, 1960  Today's Date: 03/30/2022 Time: SLP Start Time (ACUTE ONLY): 1 SLP Stop Time (ACUTE ONLY): 1430 SLP Time Calculation (min) (ACUTE ONLY): 20 min  Past Medical History:  Past Medical History:  Diagnosis Date   Anemia    Arthritis    CVA (cerebral vascular accident) ([place])    last CVA 04/2021   Diabetes mellitus without complication ([place])    type 2   Hyperlipidemia    Hypertension    Kidney stones    Past Surgical History:  Past Surgical History:  Procedure Laterality Date   ANKLE SURGERY     BIOPSY  09/10/2021   Procedure: BIOPSY;  Surgeon: Carol Ada, MD;  Location: [facility] ENDOSCOPY;  Service: Gastroenterology;;   BIOPSY  10/07/2021   Procedure: BIOPSY;  Surgeon: Carol Ada, MD;  Location: [facility] ENDOSCOPY;  Service: Gastroenterology;;   COLONOSCOPY N/A 10/07/2021   Procedure: COLONOSCOPY;  Surgeon: Carol Ada, MD;  Location: [place];  Service: Gastroenterology;  Laterality: N/A;   COLONOSCOPY WITH PROPOFOL N/A 09/06/2021   Procedure: COLONOSCOPY WITH PROPOFOL;  Surgeon: Harvel Quale, MD;  [facility];  Service: Gastroenterology;  Laterality: N/A;   ENTEROSCOPY N/A 09/10/2021   Procedure: ENTEROSCOPY;  Surgeon: Carol Ada, MD;  Location: [place];  Service: Gastroenterology;  Laterality: N/A;   ENTEROSCOPY  09/06/2021   Procedure: ENTEROSCOPY;  Surgeon: Harvel Quale, MD;  [facility];  Service: Gastroenterology;;   ESOPHAGOGASTRODUODENOSCOPY (EGD) WITH PROPOFOL  09/06/2021   Procedure: ESOPHAGOGASTRODUODENOSCOPY (EGD) WITH PROPOFOL;  Surgeon: Harvel Quale, MD;  [facility];  Service: Gastroenterology;;   [place] N/A 09/19/2021   Procedure: Beryle Quant;  Surgeon: Carol Ada, MD;  Location: [place];  Service: Gastroenterology;  Laterality: N/A;   FOREIGN  BODY REMOVAL  09/19/2021   Procedure: FOREIGN BODY REMOVAL;  Surgeon: Carol Ada, MD;  Location: [place];  Service: Gastroenterology;;   Freda Munro CAPSULE STUDY  09/06/2021   Procedure: GIVENS CAPSULE STUDY;  Surgeon: Harvel Quale, MD;  [facility];  Service: Gastroenterology;;   HERNIA REPAIR     umbilical x1 Incisional x1   INCISIONAL HERNIA REPAIR  04/13/2011   Procedure: LAPAROSCOPIC INCISIONAL HERNIA;  Surgeon: Jamesetta So, MD;  Location: [facility];  Service: General;  Laterality: N/A;  Recurrent Laparoscopic Incisional Herniorraphy with Mesh   IR ANGIOGRAM SELECTIVE EACH ADDITIONAL VESSEL  09/09/2021   IR ANGIOGRAM VISCERAL SELECTIVE  09/07/2021   IR ANGIOGRAM VISCERAL SELECTIVE  09/06/2021   IR ANGIOGRAM VISCERAL SELECTIVE  09/06/2021   IR CHOLANGIOGRAM EXISTING TUBE  11/06/2021   IR EMBO ART  VEN HEMORR LYMPH EXTRAV  INC GUIDE ROADMAPPING  09/06/2021   IR EXCHANGE BILIARY DRAIN  10/24/2021   IR EXCHANGE BILIARY DRAIN  11/19/2021   IR EXCHANGE BILIARY DRAIN  01/19/2022   IR EXCHANGE BILIARY DRAIN  02/19/2022   IR GUIDED DRAIN W CATHETER PLACEMENT  09/07/2021   IR US GUIDE BX ASP/DRAIN  09/07/2021   IR US GUIDE VASC ACCESS RIGHT  09/07/2021   IR US GUIDE VASC ACCESS RIGHT  09/06/2021   KIDNEY STONE SURGERY     RADIOLOGY WITH ANESTHESIA N/A 03/10/2022   Procedure: MRI WITH ANESTHESIA OF BRAIN WITH AND WITHOUT CONTRAST;  Surgeon: Radiologist, Medication, MD;  Location: [place];  Service: Radiology;  Laterality: N/A;   HPI:  Patient is a 62 y.o. male with PMH: DM-2, HTN,  HLD, SVT, MS, refractory UTI, GI bleed 09/2021, cholecstitis s/p perc chole drain, acute disseminated encephalomyelitis, CVA in April 2023, prolonged hospitalization 8/31-11/22/23 following GI bleed requring intubation, trach and PEG placement. which included AIR. He presented to [facility] ED on 03/29/22 with sudden onset rash which covered his entire body. In addition, spouse noticed leaking around his cholecystostomy tube. In  ED, he was afebrile, had diffuse papular rash on both arms, legs, torso and face. CXR negative for acute findings, UA indicative of UTI, CT abd/pelvis showed b/l nephrolithiasis; left UVJ 6 mm stone with obstruction; right kidney cyst; RUQ PERC drain in place with no fluid collection. Urology consulted and recommended stenting in AM next date. Patient was transferred from [facility] to [facility]. On 03/30/22, patient underwent cystoscopy with left ureteroscopy, holmium laser lithotripsy and left ureteral stent placement. SLP ordered to evaluate swallow function.    Assessment / Plan / Recommendation  Clinical Impression  Patient presents with clinical s/s of what appears to be a cognitive-based dysphagia and is mild in nature. Patient's swallow function assessed with PO intake of thin liquids and mechanical soft solids (water and graham crackers). When taking sips of water from straw, swallow initiation appeared timely and no overt s/s aspiration or penetration. When he was eating graham crackers he appeared to be tolerating without difficulty but he started to become emotionally labile, crying and talking with almost incomprehensible speech. (It did seem that talking about his wife and daughter who were both in the room is what triggered this.) He did exhibit a couple instances of delayed coughing which SLP suspects is related to poor attention when eating graham cracker. Overall, patient appears to be tolerating PO's well and SLP recommending regular texture solids and thin liquids. SLP plans to f/u one time to ensure toleration secondary to cognitive nature of patient's dyspahgia. SLP Visit Diagnosis: Dysphagia, unspecified (R13.10)    Aspiration Risk  Mild aspiration risk    Diet Recommendation Regular;Thin liquid   Liquid Administration via: Cup;Straw Medication Administration: Whole meds with liquid Supervision: Patient able to self feed;Intermittent supervision to cue for compensatory  strategies Compensations: Slow rate;Small sips/bites Postural Changes: Seated upright at 90 degrees    Other  Recommendations Oral Care Recommendations: Oral care BID    Recommendations for follow up therapy are one component of a multi-disciplinary discharge planning process, led by the attending physician.  Recommendations may be updated based on patient status, additional functional criteria and insurance authorization.  Follow up Recommendations No SLP follow up      Assistance Recommended at Discharge    Functional Status Assessment Patient has had a recent decline in their functional status and demonstrates the ability to make significant improvements in function in a reasonable and predictable amount of time.  Frequency and Duration min 1 x/week  1 week       Prognosis Prognosis for improved oropharyngeal function: Good      Swallow Study   General Date of Onset: 03/30/22 HPI: Patient is a 62 y.o. male with PMH: DM-2, HTN, HLD, SVT, MS, refractory UTI, GI bleed 09/2021, cholecstitis s/p perc chole drain, acute disseminated encephalomyelitis, CVA in April 2023, prolonged hospitalization 8/31-11/22/23 following GI bleed requring intubation, trach and PEG placement. which included AIR. He presented to [facility] ED on 03/29/22 with sudden onset rash which covered his entire body. In addition, spouse noticed leaking around his cholecystostomy tube. In ED, he was afebrile, had diffuse papular rash on both arms, legs, torso and face. CXR negative for  acute findings, UA indicative of UTI, CT abd/pelvis showed b/l nephrolithiasis; left UVJ 6 mm stone with obstruction; right kidney cyst; RUQ PERC drain in place with no fluid collection. Urology consulted and recommended stenting in AM next date. Patient was transferred from [facility] to [facility]. On 03/30/22, patient underwent cystoscopy with left ureteroscopy, holmium laser lithotripsy and left ureteral stent placement. SLP ordered to evaluate swallow  function. Type of Study: Bedside Swallow Evaluation Previous Swallow Assessment: during previous admission last year (2023) Diet Prior to this Study: NPO Temperature Spikes Noted: No Respiratory Status: Room air History of Recent Intubation: No Behavior/Cognition: Alert;Cooperative;Pleasant mood Oral Cavity Assessment: Within Functional Limits Oral Care Completed by SLP: No Oral Cavity - Dentition: Adequate natural dentition Vision: Functional for self-feeding Self-Feeding Abilities: Able to feed self Patient Positioning: Upright in bed Baseline Vocal Quality: Normal Volitional Cough: Strong Volitional Swallow: Able to elicit    Oral/Motor/Sensory Function Overall Oral Motor/Sensory Function: Within functional limits   Ice Chips     Thin Liquid Thin Liquid: Within functional limits Presentation: Self Fed;Straw    Nectar Thick     Honey Thick     Puree     Solid     Solid: Impaired Pharyngeal Phase Impairments: Other (comments) Other Comments: a couple instances of cough which occured when patient becoming emotionally labile, crying with food in his mouth.     Sonia Baller, MA, CCC-SLP Speech Therapy

## 2022-03-30 NOTE — Progress Notes (Signed)
OT Cancellation Note  Patient Details Name: [name] MRN: [id number] DOB: 18-Jun-1960   Cancelled Treatment:    Reason Eval/Treat Not Completed: Patient at procedure or test/ unavailable (Transfer to [facility] for procedure.)  Elliot Cousin 03/30/2022, 3:20 PM

## 2022-03-30 NOTE — ED Notes (Signed)
Report called and accepted by 58M RN.

## 2022-03-30 NOTE — Progress Notes (Signed)
NAME:  Troy Randall, MRN:  [id number], DOB:  06-22-1960, LOS: 0 ADMISSION DATE:  03/29/2022, CONSULTATION DATE:  3/24 REFERRING MD:  Dr. Nevada Crane, CHIEF COMPLAINT:  Rash   History of Present Illness:  62 year old male with pertinent PMH T2DM, HTN, HLD, SVT, MS, refractory UTI, urinary retention with chronic foley, previous GI bleed 09/2021 s/p IR coil of jejunal branches of SMA, Cholecystitis s/p perc chole drain, acute disseminated encephalomyelitis presents to [facility] ED on 3/24 with sudden onset rash.  Patient's wife states he had a rash that appeared suddenly on 3/23 around noon associated with fever and pain.  States the rash was all over his body. Tried benadryl which helped with itching. Of note patient was recently started on Bactrim on 3/15 for UTI. Wife also noticed leaking around cholecystostomy tube when flushing today. Rash persists and patient came to [facility] ED on 3/24 for further eval.  On arrival to [facility] ED, patient afebrile.  Diffuse papular rash  appreciated to both arms and legs, torso and face. No blisters or oral lesions appreciated; no skin sloughing. Denies any resp distress. Vital stable and on room air. No respiratory distress. Given Benadryl. CXR no acute findings. UA indicative of UTI. CT abd/pelvis showing b/l nephrolithiasis; left UVJ 6 mm stone with obstruction; right kidney cyst; RUQ PERC drain in place with no fluid collection.  Urology consulted recommending IV abx and IV fluids. Plan for stenting in am and NPO at midnight.  Cultures obtained and started on IV fluids.  [facility] consulted for admission but deferred due to concern for early onset SJS.  PCCM consulted for possible icu admission.  Pertinent ED labs: LA 2.9 then 2.2, NA 132, K5.4, creat 2.41, AG 16, WBC 7.5  Pertinent  Medical History   Past Medical History:  Diagnosis Date   Anemia    Arthritis    CVA (cerebral vascular accident) ([place])    last CVA 04/2021   Diabetes mellitus without complication ([place])    type 2    Hyperlipidemia    Hypertension    Kidney stones    Significant Hospital Events: Including procedures, antibiotic start and stop dates in addition to other pertinent events   3/24 admitted with rash likely precipitated by bactrim use; also sepsis from UTI and left 12mm UVJ stone; urology plan for stent in am; pccm consulted for ICU admission  Interim History / Subjective:  Pt reports feeling OK post procedure, tearful at times. Family at bedside. Afebrile   Objective   Blood pressure (!) 162/92, pulse 85, temperature (!) 97.2 F (36.2 C), temperature source Axillary, resp. rate (!) 21, height 6' (1.829 m), weight 98.8 kg, SpO2 98 %.        Intake/Output Summary (Last 24 hours) at 03/30/2022 1451 Last data filed at 03/30/2022 1424 Gross per 24 hour  Intake 2994.75 ml  Output 1405 ml  Net 1589.75 ml    Filed Weights   03/30/22 0103 03/30/22 0938 03/30/22 1355  Weight: 96.8 kg 96.8 kg 98.8 kg    Examination: General: chronically ill appearing adult male lying in bed   HEENT: MM pink/moist, anicteric, pupils =/reactive, voice clear  Neuro: Awake, alert, oriented, frequently tearful during interview  CV: s1s2 RRR with frequent PVC's, no m/r/g PULM: non-labored at rest, lungs bilaterally clear  GI: soft, bsx4 active  Extremities: warm/dry, no edema  Skin: resolving diffuse maculopapular rash over entire body, no blistering or oral lesions or skin sloughing   Resolved Hospital Problem list  Assessment & Plan:   Diffuse Maculopapular Rash Suspected drug reaction from Bactrim use -benadryl IV Q6  -Pepcid BID  -s/p IV steroids  -avoid SULFA ABX, on allergy list -eucerin lotion to dry areas   Sepsis UTI w/ chronic foley B/l nephrolithiasis; 6 mm R UVJ obstructing stone -appreciate Urology  -s/p 3/25 cystoscopy with left ureteroscopy, lithotripsy and stent placement  -follow hemodynamics in ICU  -follow cultures to maturity  -continue cefepime, vancomycin    AKI Urinary retention Hyponatremia: likley hypovolemia Mild Hyperkalemia -ILR at 181ml/hr  -Trend BMP / urinary output -Replace electrolytes as indicated, Mg+ 1gm with PVC's  -Avoid nephrotoxic agents, ensure adequate renal perfusion -foley exchanged on admit, continue current foley   Hx of acute cholecystitis s/p perc chole drain -IR consulted, pending GB biliary drain exchange  -appreciate IR  -flush drain BID   Hx of Acute Disseminated Encephalomyelitis MS Baseline-patient is able to walk with walker -minimize all sedating meds  -hold home robaxin  -PT/OT/SLP evaluation   DMT2 -SSI, sensitive scale   Previous GI bleed 09/2021 s/p IR coil of jejunal branches of SMA Hx Extensive ABD Mesh -trend CBC   Hx of HTN/HLD -hold home antihypertensives 3/25, consider restart am 3/26     Best Practice (right click and "Reselect all SmartList Selections" daily)  Diet/type: Regular consistency (see orders) DVT prophylaxis: prophylactic heparin  GI prophylaxis: H2B Lines: N/A Foley:  Yes, and it is still needed Code Status:  full code Last date of multidisciplinary goals of care discussion: 3/25 Wife and daughter updated at bedside. Full scope care.    To [name] as of 3/26.      Noe Gens, MSN, APRN, NP-C, AGACNP-BC [facility] 03/30/2022, 2:51 PM   Please see [email or web address] for pager details.   From 7A-7P if no response, please call [phone] After hours, please call ELink [phone]

## 2022-03-30 NOTE — Anesthesia Preprocedure Evaluation (Addendum)
Anesthesia Evaluation  Patient identified by MRN, date of birth, ID band Patient awake    Reviewed: Allergy & Precautions, H&P , NPO status , Patient's Chart, lab work & pertinent test results, reviewed documented beta blocker date and time   Airway Mallampati: III  TM Distance: >3 FB Neck ROM: Full    Dental no notable dental hx. (+) Teeth Intact, Dental Advisory Given   Pulmonary neg pulmonary ROS   Pulmonary exam normal breath sounds clear to auscultation       Cardiovascular hypertension, Pt. on medications and Pt. on home beta blockers  Rhythm:Regular Rate:Normal     Neuro/Psych CVA, No Residual Symptoms  negative psych ROS   GI/Hepatic negative GI ROS, Neg liver ROS,,,  Endo/Other  diabetes, Insulin Dependent    Renal/GU Renal disease  negative genitourinary   Musculoskeletal  (+) Arthritis , Osteoarthritis,    Abdominal   Peds  Hematology  (+) Blood dyscrasia, anemia   Anesthesia Other Findings   Reproductive/Obstetrics negative OB ROS                             Anesthesia Physical Anesthesia Plan  ASA: 3  Anesthesia Plan: General   Post-op Pain Management: Ofirmev IV (intra-op)*   Induction: Intravenous  PONV Risk Score and Plan: 3 and Ondansetron, Dexamethasone and Midazolam  Airway Management Planned: LMA  Additional Equipment:   Intra-op Plan:   Post-operative Plan: Extubation in OR  Informed Consent: I have reviewed the patients History and Physical, chart, labs and discussed the procedure including the risks, benefits and alternatives for the proposed anesthesia with the patient or authorized representative who has indicated his/her understanding and acceptance.     Dental advisory given  Plan Discussed with: CRNA  Anesthesia Plan Comments:        Anesthesia Quick Evaluation

## 2022-03-30 NOTE — Anesthesia Postprocedure Evaluation (Signed)
Anesthesia Post Note  Patient: Troy Randall  Procedure(s) Performed: CYSTOSCOPY/RETROGRADE/URETEROSCOPY/HOLMIUM LASER/STENT PLACEMENT (Left)     Patient location during evaluation: PACU Anesthesia Type: General Level of consciousness: awake and alert Pain management: pain level controlled Vital Signs Assessment: post-procedure vital signs reviewed and stable Respiratory status: spontaneous breathing, nonlabored ventilation and respiratory function stable Cardiovascular status: blood pressure returned to baseline and stable Postop Assessment: no apparent nausea or vomiting Anesthetic complications: no  No notable events documented.  Last Vitals:  Vitals:   03/30/22 1300 03/30/22 1315  BP: (!) 132/91 129/86  Pulse: 83 89  Resp: (!) 25 17  Temp:  (!) 36.2 C  SpO2: 95% 93%    Last Pain:  Vitals:   03/30/22 1315  TempSrc:   PainSc: 0-No pain                 Ryland Smoots,W. EDMOND

## 2022-03-30 NOTE — Progress Notes (Signed)
eLink Physician-Brief Progress Note Patient Name: Troy Randall DOB: May 18, 1960 MRN: [id number]   Date of Service  03/30/2022  HPI/Events of Note  61/M with DM, HTN, SVT, MS, recurrent UTI, chronic indwelling foley cath, presented to the ED with diffuse rash associated with fever and pain. Pt was placed on Bactrim on 3/15 for UTI.   BP 123/77, HR 114, RR 22, O2 sats 97%.   CT abdomen and pelvis: 1. Bilateral nephrolithiasis. 2. Left UVJ 6 mm stone with obstruction. 3. Right kidney cyst. 4. Right upper quadrant percutaneous drain. No fluid collections identified. 5. Anterior abdominal postop changes status post hernia repair with a small residual abdominal wall defect. K 5.4 --> 4.5  eICU Interventions  Continue IV steroids, benadryl and famotidine.   Continue Vancomycin and cefepime.  Insulin for glucose control.  Continue to trend lactate. Urology consult for stent placement in the AM. NPO post midnight.  Follow up urine and blood cultures.  Heparin for DVT prophylaxis.      Intervention Category Evaluation Type: New Patient Evaluation  Elsie Lincoln 03/30/2022, 1:28 AM

## 2022-03-30 NOTE — H&P (Incomplete)
NAME:  Troy Randall, MRN:  [id number], DOB:  1960-05-26, LOS: 0 ADMISSION DATE:  03/29/2022, CONSULTATION DATE:  3/24 REFERRING MD:  Dr. Nevada Crane, CHIEF COMPLAINT:  Rash   History of Present Illness:  Patient is a 62 year old male with pertinent PMH T2DM, HTN, HLD, SVT, MS, refractory UTI, urinary retention with chronic foley, previous GI bleed s/p IR coil of jejunal branches of SMA, Cholecystitis s/p chole drain, acute disseminated encephalomyelitis presents to [facility] ED on 3/24 sudden onset rash.  Patient states he had a rash that appeared suddenly on 3/23 around noon associated with fever and pain.  States the rash was all over his body. Tried benadryl which helped with itching. Of note patient was recently started on Bactrim on 3/15 for UTI. Wife also noticed leaking around cholecystostomy tube when flushing. Patient came to [facility] ED on 3/24 for further eval.  On arrival to [facility] ED, patient febrile 100.4 F.  Diffuse papular rash  appreciated to both arms and legs, torso and face. No blisters or oral lesions appreciated; no skin sloughing. Vital stable and on room air. No respiratory distress. Given Benadryl. CXR no acute findings. UA indicative of UTI. CT abd/pelvis showing b/l nephrolithiasis; left UVJ 6 mm stone with obstruction; right kidney cyst; RUQ PERC drain in place with no fluid collection.  Urology consulted recommending IV abx and IV fluids. Would like patient transferred to [facility] and plan for stenting in am.  Cultures obtained and started on IV fluids.  [facility] consulted for admission but deferred due to possible early onset SJS.  PCCM consulted.  Pertinent ED labs: LA 2.9 then 2.2, NA 132, K5.4, creat 2.41, AG 16, WBC 7.5  Pertinent  Medical History   Past Medical History:  Diagnosis Date  . Anemia   . Arthritis   . CVA (cerebral vascular accident) ([place])    last CVA 04/2021  . Diabetes mellitus without complication ([place])    type 2  . Hyperlipidemia   . Hypertension   . Kidney stones       Significant Hospital Events: Including procedures, antibiotic start and stop dates in addition to other pertinent events     Interim History / Subjective:  ***  Objective   Blood pressure 111/81, pulse (!) 113, temperature 97.8 F (36.6 C), temperature source Oral, resp. rate 20, height 6' (1.829 m), weight 102.5 kg, SpO2 100 %.        Intake/Output Summary (Last 24 hours) at 03/29/2022 2232 Last data filed at 03/29/2022 2107 Gross per 24 hour  Intake 1051.89 ml  Output --  Net 1051.89 ml   Filed Weights   03/29/22 1450  Weight: 102.5 kg    Examination: General:  *** NAD; critically ill appearing on mech vent HEENT: MM pink/moist; ETT in place Neuro: Aox3; MAE; sedated; CV: s1s2, RRR ***, no m/r/g PULM:  dim clear BS bilaterally; Nina ***; on mech vent PRVC GI: soft, bsx4 active  Extremities: warm/dry, *** edema  Skin: no rashes or lesions    Resolved Hospital Problem list   ***  Assessment & Plan:  Diffuse maculopapular rash Plan: -  Sepsis UTI 6 mm R UVJ stone Plan: -    AKI Plan: -  Hx of acute cholecystitis s/p perc chole drain Plan: -per chole appears to be malpositioned -IR consulted -will likely need to be replaced  Hx of Acute d MS  Hx of HTN Plan: -  Best Practice (right click and "Reselect all SmartList Selections" daily)   Diet/type: {  diet type:25684} DVT prophylaxis: {anticoagulation (Optional):25687} GI prophylaxis: [id number] Lines: {Central Venous Access:25771} Foley:  {Central Venous Access:25691} Code Status:  {Code Status:26939} Last date of multidisciplinary goals of care discussion [***]  Labs   CBC: Recent Labs  Lab 03/29/22 1618  WBC 7.5  NEUTROABS 5.5  HGB 13.5  HCT 41.1  MCV 85.4  PLT 123XX123    Basic Metabolic Panel: Recent Labs  Lab 03/29/22 1618  NA 132*  K 5.4*  CL 94*  CO2 22  GLUCOSE 118*  BUN 25*  CREATININE 2.41*  CALCIUM 9.5   GFR: Estimated Creatinine Clearance: 40.9 mL/min (A)  (by C-G formula based on SCr of 2.41 mg/dL (H)). Recent Labs  Lab 03/29/22 1615 03/29/22 1618 03/29/22 2105  WBC  --  7.5  --   LATICACIDVEN 2.9*  --  2.2*    Liver Function Tests: Recent Labs  Lab 03/29/22 1618  AST 41  ALT 35  ALKPHOS 63  BILITOT 0.9  PROT 7.4  ALBUMIN 3.4*   No results for input(s): "LIPASE", "AMYLASE" in the last 168 hours. No results for input(s): "AMMONIA" in the last 168 hours.  ABG    Component Value Date/Time   PHART 7.476 (H) 10/03/2021 0546   PCO2ART 36.1 10/03/2021 0546   PO2ART 171 (H) 10/03/2021 0546   HCO3 26.1 10/03/2021 0546   TCO2 27 10/03/2021 0546   ACIDBASEDEF 5.0 (H) 09/08/2021 1907   O2SAT 100 10/03/2021 0546     Coagulation Profile: Recent Labs  Lab 03/29/22 1618  INR 1.1    Cardiac Enzymes: No results for input(s): "CKTOTAL", "CKMB", "CKMBINDEX", "TROPONINI" in the last 168 hours.  HbA1C: Hgb A1c MFr Bld  Date/Time Value Ref Range Status  10/27/2021 03:38 AM 5.1 4.8 - 5.6 % Final    Comment:    (NOTE) Pre diabetes:          5.7%-6.4%  Diabetes:              >6.4%  Glycemic control for   <7.0% adults with diabetes   04/29/2021 05:02 AM 6.2 (H) 4.8 - 5.6 % Final    Comment:    (NOTE) Pre diabetes:          5.7%-6.4%  Diabetes:              >6.4%  Glycemic control for   <7.0% adults with diabetes     CBG: No results for input(s): "GLUCAP" in the last 168 hours.  Review of Systems:   ***  Past Medical History:  He,  has a past medical history of Anemia, Arthritis, CVA (cerebral vascular accident) ([place]), Diabetes mellitus without complication ([place]), Hyperlipidemia, Hypertension, and Kidney stones.   Surgical History:   Past Surgical History:  Procedure Laterality Date  . ANKLE SURGERY    . BIOPSY  09/10/2021   Procedure: BIOPSY;  Surgeon: Carol Ada, MD;  Location: [facility] ENDOSCOPY;  Service: Gastroenterology;;  . BIOPSY  10/07/2021   Procedure: BIOPSY;  Surgeon: Carol Ada, MD;  Location: [facility]  ENDOSCOPY;  Service: Gastroenterology;;  . COLONOSCOPY N/A 10/07/2021   Procedure: COLONOSCOPY;  Surgeon: Carol Ada, MD;  Location: [place];  Service: Gastroenterology;  Laterality: N/A;  . COLONOSCOPY WITH PROPOFOL N/A 09/06/2021   Procedure: COLONOSCOPY WITH PROPOFOL;  Surgeon: Harvel Quale, MD;  [facility];  Service: Gastroenterology;  Laterality: N/A;  . ENTEROSCOPY N/A 09/10/2021   Procedure: ENTEROSCOPY;  Surgeon: Carol Ada, MD;  Location: [place];  Service: Gastroenterology;  Laterality: N/A;  .  ENTEROSCOPY  09/06/2021   Procedure: ENTEROSCOPY;  Surgeon: Harvel Quale, MD;  [facility];  Service: Gastroenterology;;  . ESOPHAGOGASTRODUODENOSCOPY (EGD) WITH PROPOFOL  09/06/2021   Procedure: ESOPHAGOGASTRODUODENOSCOPY (EGD) WITH PROPOFOL;  Surgeon: Harvel Quale, MD;  [facility];  Service: Gastroenterology;;  . Otho Darner SIGMOIDOSCOPY N/A 09/19/2021   Procedure: Beryle Quant;  Surgeon: Carol Ada, MD;  Location: [place];  Service: Gastroenterology;  Laterality: N/A;  . FOREIGN BODY REMOVAL  09/19/2021   Procedure: FOREIGN BODY REMOVAL;  Surgeon: Carol Ada, MD;  Location: [place];  Service: Gastroenterology;;  . Freda Munro CAPSULE STUDY  09/06/2021   Procedure: GIVENS CAPSULE STUDY;  Surgeon: Harvel Quale, MD;  [facility];  Service: Gastroenterology;;  . HERNIA REPAIR     umbilical x1 Incisional x1  . INCISIONAL HERNIA REPAIR  04/13/2011   Procedure: LAPAROSCOPIC INCISIONAL HERNIA;  Surgeon: Jamesetta So, MD;  Location: [facility];  Service: General;  Laterality: N/A;  Recurrent Laparoscopic Incisional Herniorraphy with Mesh  . IR ANGIOGRAM SELECTIVE EACH ADDITIONAL VESSEL  09/09/2021  . IR ANGIOGRAM VISCERAL SELECTIVE  09/07/2021  . IR ANGIOGRAM VISCERAL SELECTIVE  09/06/2021  . IR ANGIOGRAM VISCERAL SELECTIVE  09/06/2021  . IR CHOLANGIOGRAM EXISTING TUBE  11/06/2021  . IR  EMBO ART  VEN HEMORR LYMPH EXTRAV  INC GUIDE ROADMAPPING  09/06/2021  . IR EXCHANGE BILIARY DRAIN  10/24/2021  . IR EXCHANGE BILIARY DRAIN  11/19/2021  . IR EXCHANGE BILIARY DRAIN  01/19/2022  . IR EXCHANGE BILIARY DRAIN  02/19/2022  . IR GUIDED DRAIN W CATHETER PLACEMENT  09/07/2021  . IR US GUIDE BX ASP/DRAIN  09/07/2021  . IR US GUIDE VASC ACCESS RIGHT  09/07/2021  . IR US GUIDE VASC ACCESS RIGHT  09/06/2021  . KIDNEY STONE SURGERY    . RADIOLOGY WITH ANESTHESIA N/A 03/10/2022   Procedure: MRI WITH ANESTHESIA OF BRAIN WITH AND WITHOUT CONTRAST;  Surgeon: Radiologist, Medication, MD;  Location: [place];  Service: Radiology;  Laterality: N/A;     Social History:   reports that he has never smoked. He has quit using smokeless tobacco.  His smokeless tobacco use included snuff. He reports that he does not drink alcohol and does not use drugs.   Family History:  His family history includes Cancer in an other family member; Diabetes in his father; Heart attack in an other family member; Heart failure in his mother. There is no history of Anesthesia problems, Hypotension, Malignant hyperthermia, Pseudochol deficiency, Colon cancer, or Inflammatory bowel disease.   Allergies Allergies  Allergen Reactions  . Bactrim [Sulfamethoxazole-Trimethoprim] Rash  . Amoxicillin Hives     Home Medications  Prior to Admission medications   Medication Sig Start Date End Date Taking? Authorizing Provider  acetaminophen (TYLENOL) 325 MG tablet Take 1-2 tablets (325-650 mg total) by mouth every 4 (four) hours as needed for mild pain. 11/26/21   Setzer, Edman Circle, PA-C  ascorbic acid (VITAMIN C) 500 MG tablet Take 500 mg by mouth daily.    [provider]  BD VEO INSULIN SYRINGE U/F 31G X 15/64" 1 ML MISC  USE AS DIRECTED 06/08/17   Hagler, Apolonio Schneiders, MD  Finerenone (KERENDIA) 20 MG TABS Take 20 mg by mouth daily.    [provider]  glucose blood (ONETOUCH VERIO) test strip TEST twice a day 05/04/17   Caren Macadam, MD  Insulin Pen Needle (NOVOTWIST) 32G X 5 MM MISC Use two daily to inject Victoza and Toujeo.  04/25/15   Elayne Snare, MD  methocarbamol (ROBAXIN) 500 MG tablet Take 1 tablet (500 mg total) by mouth every 6 (six) hours as needed for muscle spasms. Patient taking differently: Take 500 mg by mouth at bedtime. 11/26/21   Setzer, Edman Circle, PA-C  metoprolol tartrate (LOPRESSOR) 25 MG tablet Take 1 tablet (25 mg total) by mouth 2 (two) times daily. 11/26/21   Setzer, Edman Circle, PA-C  ONETOUCH DELICA LANCETS [id number] MISC Use to check blood sugar once a day dx code E11.65 11/21/14   Elayne Snare, MD  pantoprazole (PROTONIX) 40 MG tablet Take 1 tablet (40 mg total) by mouth daily. 11/27/21   Setzer, Edman Circle, PA-C  Probiotic Product (PROBIOTIC PO) Take 8.5 mg by mouth daily.    [provider]  rosuvastatin (CRESTOR) 40 MG tablet Take 1 tablet (40 mg total) by mouth daily. 04/29/21   Little Ishikawa, MD  tamsulosin (FLOMAX) 0.4 MG CAPS capsule Take 1 capsule (0.4 mg total) by mouth daily after supper. Patient not taking: Reported on 03/05/2022 11/26/21   Barbie Banner, PA-C  traZODone (DESYREL) 50 MG tablet Take 0.5-1 tablets (25-50 mg total) by mouth at bedtime as needed for sleep. Patient taking differently: Take 150 mg by mouth at bedtime. 11/26/21   Setzer, Edman Circle, PA-C  TRESIBA FLEXTOUCH 200 UNIT/ML FlexTouch Pen Inject under skin 15 units twice a day. Patient taking differently: Inject 16 Units into the skin 2 (two) times daily. If Blood glucose is over 129 11/26/21   Setzer, Edman Circle, PA-C     Critical care time: 45 minutes***    JD Rexene Agent [facility] 03/29/2022, 10:32 PM  Please see [email or web address] for pager details.  From 7A-7P if no response, please call [phone]. After hours, please call ELink [phone].

## 2022-03-30 NOTE — Anesthesia Procedure Notes (Signed)
Procedure Name: LMA Insertion Date/Time: 03/30/2022 11:23 AM  Performed by: Garrel Ridgel, CRNAPre-anesthesia Checklist: Patient identified, Emergency Drugs available, Suction available and Patient being monitored Patient Re-evaluated:Patient Re-evaluated prior to induction Oxygen Delivery Method: Circle system utilized Preoxygenation: Pre-oxygenation with 100% oxygen Induction Type: IV induction Ventilation: Mask ventilation without difficulty LMA: LMA inserted LMA Size: 5.0 Tube type: Oral Number of attempts: 1 Placement Confirmation: positive ETCO2 and breath sounds checked- equal and bilateral Tube secured with: Tape Dental Injury: Teeth and Oropharynx as per pre-operative assessment

## 2022-03-30 NOTE — Consult Note (Signed)
Urology Consult   Physician requesting consult: Andres Labrum  Reason for consult: Left ureteral stone and AKI  History of Present Illness: Troy Randall is a 62 y.o.  male with the past medical history as outlined below.  He is currently followed by Dr. Zannie Cove in [place].The patient presented to the emergency department with acute mental status changes along with a generalized rash after starting a course of Bactrim for suspected UT he requires an indwelling Foley catheter due to a neurogenic bladder secondary to MS.  CT stone study during his ER evaluation revealed an obstructing 6 mm left UVJ stone with mild to moderate left-sided hydronephrosis along with bilateral nonobstructing renal stones.  Currently, the patient states that his pain is well-controlled, but still has discomfort when lifting his left leg and his wife states that he is still somewhat confused.  He is A&O x 3 when questioned.   Past Medical History:  Diagnosis Date   Anemia    Arthritis    CVA (cerebral vascular accident) ([place])    last CVA 04/2021   Diabetes mellitus without complication ([place])    type 2   Hyperlipidemia    Hypertension    Kidney stones     Past Surgical History:  Procedure Laterality Date   ANKLE SURGERY     BIOPSY  09/10/2021   Procedure: BIOPSY;  Surgeon: Carol Ada, MD;  Location: [facility] ENDOSCOPY;  Service: Gastroenterology;;   BIOPSY  10/07/2021   Procedure: BIOPSY;  Surgeon: Carol Ada, MD;  Location: [facility] ENDOSCOPY;  Service: Gastroenterology;;   COLONOSCOPY N/A 10/07/2021   Procedure: COLONOSCOPY;  Surgeon: Carol Ada, MD;  Location: [place];  Service: Gastroenterology;  Laterality: N/A;   COLONOSCOPY WITH PROPOFOL N/A 09/06/2021   Procedure: COLONOSCOPY WITH PROPOFOL;  Surgeon: Harvel Quale, MD;  [facility];  Service: Gastroenterology;  Laterality: N/A;   ENTEROSCOPY N/A 09/10/2021   Procedure: ENTEROSCOPY;  Surgeon: Carol Ada, MD;  Location: [place];  Service: Gastroenterology;  Laterality: N/A;   ENTEROSCOPY  09/06/2021   Procedure: ENTEROSCOPY;  Surgeon: Harvel Quale, MD;  [facility];  Service: Gastroenterology;;   ESOPHAGOGASTRODUODENOSCOPY (EGD) WITH PROPOFOL  09/06/2021   Procedure: ESOPHAGOGASTRODUODENOSCOPY (EGD) WITH PROPOFOL;  Surgeon: Harvel Quale, MD;  [facility];  Service: Gastroenterology;;   [place] N/A 09/19/2021   Procedure: Beryle Quant;  Surgeon: Carol Ada, MD;  Location: [place];  Service: Gastroenterology;  Laterality: N/A;   FOREIGN BODY REMOVAL  09/19/2021   Procedure: FOREIGN BODY REMOVAL;  Surgeon: Carol Ada, MD;  Location: [facility] ENDOSCOPY;  Service: Gastroenterology;;   Freda Munro CAPSULE STUDY  09/06/2021   Procedure: GIVENS CAPSULE STUDY;  Surgeon: Harvel Quale, MD;  [facility];  Service: Gastroenterology;;   HERNIA REPAIR     umbilical x1 Incisional x1   INCISIONAL HERNIA REPAIR  04/13/2011   Procedure: LAPAROSCOPIC INCISIONAL HERNIA;  Surgeon: Jamesetta So, MD;  Location: [facility];  Service: General;  Laterality: N/A;  Recurrent Laparoscopic Incisional Herniorraphy with Mesh   IR ANGIOGRAM SELECTIVE EACH ADDITIONAL VESSEL  09/09/2021   IR ANGIOGRAM VISCERAL SELECTIVE  09/07/2021   IR ANGIOGRAM VISCERAL SELECTIVE  09/06/2021   IR ANGIOGRAM VISCERAL SELECTIVE  09/06/2021   IR CHOLANGIOGRAM EXISTING TUBE  11/06/2021   IR EMBO ART  VEN HEMORR LYMPH EXTRAV  INC GUIDE ROADMAPPING  09/06/2021   IR EXCHANGE BILIARY DRAIN  10/24/2021   IR EXCHANGE BILIARY DRAIN  11/19/2021   IR  EXCHANGE BILIARY DRAIN  01/19/2022   IR EXCHANGE BILIARY DRAIN  02/19/2022   IR GUIDED DRAIN W CATHETER PLACEMENT  09/07/2021   IR US GUIDE BX ASP/DRAIN  09/07/2021   IR US GUIDE VASC ACCESS RIGHT  09/07/2021   IR US GUIDE VASC ACCESS RIGHT  09/06/2021   KIDNEY STONE SURGERY     RADIOLOGY WITH ANESTHESIA N/A 03/10/2022   Procedure: MRI WITH ANESTHESIA  OF BRAIN WITH AND WITHOUT CONTRAST;  Surgeon: Radiologist, Medication, MD;  Location: [place];  Service: Radiology;  Laterality: N/A;    Current Hospital Medications:  Home Meds:  Current Meds  Medication Sig   [DISCONTINUED] sulfamethoxazole-trimethoprim (BACTRIM DS) 800-160 MG tablet Take 1 tablet by mouth every 12 (twelve) hours.    Scheduled Meds:  Chlorhexidine Gluconate Cloth  6 each Topical Q0600   diphenhydrAMINE  25 mg Intravenous Q6H   heparin  5,000 Units Subcutaneous Q8H   insulin aspart  0-9 Units Subcutaneous Q4H   mupirocin ointment  1 Application Nasal BID   sodium zirconium cyclosilicate  10 g Oral BID   vancomycin variable dose per unstable renal function (pharmacist dosing)   Does not apply See admin instructions   Continuous Infusions:  sodium chloride 10 mL/hr at 03/30/22 0600   ceFEPime (MAXIPIME) IV 2 g (03/30/22 0606)   famotidine (PEPCID) IV Stopped (03/30/22 0339)   lactated ringers 125 mL/hr at 03/30/22 0605   magnesium sulfate bolus IVPB 2 g (03/30/22 0759)   PRN Meds:.sodium chloride, acetaminophen, acetaminophen, docusate sodium, polyethylene glycol  Allergies:  Allergies  Allergen Reactions   Bactrim [Sulfamethoxazole-Trimethoprim] Rash   Amoxicillin Hives    Family History  Problem Relation Age of Onset   Heart failure Mother    Diabetes Father    Cancer Other    Heart attack Other    Anesthesia problems Neg Hx    Hypotension Neg Hx    Malignant hyperthermia Neg Hx    Pseudochol deficiency Neg Hx    Colon cancer Neg Hx    Inflammatory bowel disease Neg Hx     Social History:  reports that he has never smoked. He has quit using smokeless tobacco.  His smokeless tobacco use included snuff. He reports that he does not drink alcohol and does not use drugs.  ROS: A complete review of systems was performed.  All systems are negative except for pertinent findings as noted.  Physical Exam:  Vital signs in last 24 hours: Temp:  [97.8 F  (36.6 C)-99.5 F (37.5 C)] 97.8 F (36.6 C) (03/25 0728) Pulse Rate:  [88-120] 88 (03/25 0630) Resp:  [18-30] 25 (03/25 0630) BP: (100-135)/(68-102) 111/79 (03/25 0630) SpO2:  [89 %-100 %] 94 % (03/25 0630) Weight:  [96.8 kg-102.5 kg] 96.8 kg (03/25 0103) Constitutional:  Alert and oriented, No acute distress Cardiovascular: Regular rate and rhythm, No JVD Respiratory: Normal respiratory effort, Lungs clear bilaterally GI: Abdomen is soft, nontender, nondistended, no abdominal masses GU: No CVA tenderness Lymphatic: No lymphadenopathy Neurologic: Grossly intact, no focal deficits Psychiatric: Normal mood and affect  Laboratory Data:  Recent Labs    03/29/22 1618 03/29/22 2355 03/30/22 0321  WBC 7.5 6.1 7.8  HGB 13.5 13.4 12.0*  HCT 41.1 41.5 37.0*  PLT 202 181 173    Recent Labs    03/29/22 1618 03/29/22 2355 03/30/22 0321  NA 132* 130* 131*  K 5.4* 4.5 4.3  CL 94* 95* 99  GLUCOSE 118* 96 119*  BUN 25* 22* 22*  CALCIUM 9.5  8.8* 8.6*  CREATININE 2.41* 1.80* 1.74*     Results for orders placed or performed during the hospital encounter of 03/29/22 (from the past 24 hour(s))  Blood Culture (routine x 2)     Status: None (Preliminary result)   Collection Time: 03/29/22  3:36 PM   Specimen: BLOOD  Result Value Ref Range   Specimen Description BLOOD SITE NOT SPECIFIED    Special Requests      BOTTLES DRAWN AEROBIC AND ANAEROBIC Blood Culture adequate volume   Culture      NO GROWTH < 12 HOURS Performed at [facility] [address] [place], [place] [address]    Report Status PENDING   Lactic acid, plasma     Status: Abnormal   Collection Time: 03/29/22  4:15 PM  Result Value Ref Range   Lactic Acid, Venous 2.9 (HH) 0.5 - 1.9 mmol/L  Comprehensive metabolic panel     Status: Abnormal   Collection Time: 03/29/22  4:18 PM  Result Value Ref Range   Sodium 132 (L) 135 - 145 mmol/L   Potassium 5.4 (H) 3.5 - 5.1 mmol/L   Chloride 94 (L) 98 - 111 mmol/L    CO2 22 22 - 32 mmol/L   Glucose, Bld 118 (H) 70 - 99 mg/dL   BUN 25 (H) 6 - 20 mg/dL   Creatinine, Ser 2.41 (H) 0.61 - 1.24 mg/dL   Calcium 9.5 8.9 - 10.3 mg/dL   Total Protein 7.4 6.5 - 8.1 g/dL   Albumin 3.4 (L) 3.5 - 5.0 g/dL   AST 41 15 - 41 U/L   ALT 35 0 - 44 U/L   Alkaline Phosphatase 63 38 - 126 U/L   Total Bilirubin 0.9 0.3 - 1.2 mg/dL   GFR, Estimated 30 (L) >60 mL/min   Anion gap 16 (H) 5 - 15  CBC with Differential     Status: None   Collection Time: 03/29/22  4:18 PM  Result Value Ref Range   WBC 7.5 4.0 - 10.5 K/uL   RBC 4.81 4.22 - 5.81 MIL/uL   Hemoglobin 13.5 13.0 - 17.0 g/dL   HCT 41.1 39.0 - 52.0 %   MCV 85.4 80.0 - 100.0 fL   MCH 28.1 26.0 - 34.0 pg   MCHC 32.8 30.0 - 36.0 g/dL   RDW 13.3 11.5 - 15.5 %   Platelets 202 150 - 400 K/uL   nRBC 0.0 0.0 - 0.2 %   Neutrophils Relative % 73 %   Neutro Abs 5.5 1.7 - 7.7 K/uL   Lymphocytes Relative 15 %   Lymphs Abs 1.1 0.7 - 4.0 K/uL   Monocytes Relative 5 %   Monocytes Absolute 0.4 0.1 - 1.0 K/uL   Eosinophils Relative 5 %   Eosinophils Absolute 0.4 0.0 - 0.5 K/uL   Basophils Relative 1 %   Basophils Absolute 0.0 0.0 - 0.1 K/uL   Immature Granulocytes 1 %   Abs Immature Granulocytes 0.04 0.00 - 0.07 K/uL  Protime-INR     Status: None   Collection Time: 03/29/22  4:18 PM  Result Value Ref Range   Prothrombin Time 13.9 11.4 - 15.2 seconds   INR 1.1 0.8 - 1.2  APTT     Status: None   Collection Time: 03/29/22  4:18 PM  Result Value Ref Range   aPTT 24 24 - 36 seconds  Urinalysis, Routine w reflex microscopic -Urine, Catheterized; Indwelling urinary catheter     Status: Abnormal   Collection Time:  03/29/22  8:11 PM  Result Value Ref Range   Color, Urine YELLOW YELLOW   APPearance HAZY (A) CLEAR   Specific Gravity, Urine 1.019 1.005 - 1.030   pH 5.0 5.0 - 8.0   Glucose, UA NEGATIVE NEGATIVE mg/dL   Hgb urine dipstick MODERATE (A) NEGATIVE   Bilirubin Urine NEGATIVE NEGATIVE   Ketones, ur 20 (A)  NEGATIVE mg/dL   Protein, ur 30 (A) NEGATIVE mg/dL   Nitrite NEGATIVE NEGATIVE   Leukocytes,Ua LARGE (A) NEGATIVE   RBC / HPF >50 0 - 5 RBC/hpf   WBC, UA >50 0 - 5 WBC/hpf   Bacteria, UA RARE (A) NONE SEEN   Squamous Epithelial / HPF 0-5 0 - 5 /HPF   Mucus PRESENT    Budding Yeast PRESENT   Lactic acid, plasma     Status: Abnormal   Collection Time: 03/29/22  9:05 PM  Result Value Ref Range   Lactic Acid, Venous 2.2 (HH) 0.5 - 1.9 mmol/L  MRSA Next Gen by PCR, Nasal     Status: Abnormal   Collection Time: 03/29/22 10:39 PM   Specimen: Nasal Mucosa; Nasal Swab  Result Value Ref Range   MRSA by PCR Next Gen DETECTED (A) NOT DETECTED  Basic metabolic panel     Status: Abnormal   Collection Time: 03/29/22 11:55 PM  Result Value Ref Range   Sodium 130 (L) 135 - 145 mmol/L   Potassium 4.5 3.5 - 5.1 mmol/L   Chloride 95 (L) 98 - 111 mmol/L   CO2 19 (L) 22 - 32 mmol/L   Glucose, Bld 96 70 - 99 mg/dL   BUN 22 (H) 6 - 20 mg/dL   Creatinine, Ser 1.80 (H) 0.61 - 1.24 mg/dL   Calcium 8.8 (L) 8.9 - 10.3 mg/dL   GFR, Estimated 43 (L) >60 mL/min   Anion gap 16 (H) 5 - 15  CBC     Status: None   Collection Time: 03/29/22 11:55 PM  Result Value Ref Range   WBC 6.1 4.0 - 10.5 K/uL   RBC 4.83 4.22 - 5.81 MIL/uL   Hemoglobin 13.4 13.0 - 17.0 g/dL   HCT 41.5 39.0 - 52.0 %   MCV 85.9 80.0 - 100.0 fL   MCH 27.7 26.0 - 34.0 pg   MCHC 32.3 30.0 - 36.0 g/dL   RDW 13.3 11.5 - 15.5 %   Platelets 181 150 - 400 K/uL   nRBC 0.0 0.0 - 0.2 %  Procalcitonin     Status: None   Collection Time: 03/29/22 11:55 PM  Result Value Ref Range   Procalcitonin 0.31 ng/mL  Cortisol     Status: None   Collection Time: 03/29/22 11:55 PM  Result Value Ref Range   Cortisol, Plasma 10.9 ug/dL  Lactic acid, plasma     Status: Abnormal   Collection Time: 03/30/22 12:00 AM  Result Value Ref Range   Lactic Acid, Venous 2.0 (HH) 0.5 - 1.9 mmol/L  Glucose, capillary     Status: None   Collection Time: 03/30/22  1:05  AM  Result Value Ref Range   Glucose-Capillary 85 70 - 99 mg/dL  Glucose, capillary     Status: Abnormal   Collection Time: 03/30/22  3:15 AM  Result Value Ref Range   Glucose-Capillary 116 (H) 70 - 99 mg/dL  CBC with Differential/Platelet     Status: Abnormal   Collection Time: 03/30/22  3:21 AM  Result Value Ref Range   WBC 7.8 4.0 - 10.5 K/uL  RBC 4.33 4.22 - 5.81 MIL/uL   Hemoglobin 12.0 (L) 13.0 - 17.0 g/dL   HCT 37.0 (L) 39.0 - 52.0 %   MCV 85.5 80.0 - 100.0 fL   MCH 27.7 26.0 - 34.0 pg   MCHC 32.4 30.0 - 36.0 g/dL   RDW 13.4 11.5 - 15.5 %   Platelets 173 150 - 400 K/uL   nRBC 0.0 0.0 - 0.2 %   Neutrophils Relative % 84 %   Neutro Abs 6.6 1.7 - 7.7 K/uL   Lymphocytes Relative 8 %   Lymphs Abs 0.6 (L) 0.7 - 4.0 K/uL   Monocytes Relative 3 %   Monocytes Absolute 0.3 0.1 - 1.0 K/uL   Eosinophils Relative 4 %   Eosinophils Absolute 0.3 0.0 - 0.5 K/uL   Basophils Relative 0 %   Basophils Absolute 0.0 0.0 - 0.1 K/uL   Immature Granulocytes 1 %   Abs Immature Granulocytes 0.04 0.00 - 0.07 K/uL  Comprehensive metabolic panel     Status: Abnormal   Collection Time: 03/30/22  3:21 AM  Result Value Ref Range   Sodium 131 (L) 135 - 145 mmol/L   Potassium 4.3 3.5 - 5.1 mmol/L   Chloride 99 98 - 111 mmol/L   CO2 19 (L) 22 - 32 mmol/L   Glucose, Bld 119 (H) 70 - 99 mg/dL   BUN 22 (H) 6 - 20 mg/dL   Creatinine, Ser 1.74 (H) 0.61 - 1.24 mg/dL   Calcium 8.6 (L) 8.9 - 10.3 mg/dL   Total Protein 6.1 (L) 6.5 - 8.1 g/dL   Albumin 2.8 (L) 3.5 - 5.0 g/dL   AST 35 15 - 41 U/L   ALT 33 0 - 44 U/L   Alkaline Phosphatase 58 38 - 126 U/L   Total Bilirubin 1.0 0.3 - 1.2 mg/dL   GFR, Estimated 45 (L) >60 mL/min   Anion gap 13 5 - 15  Sedimentation rate     Status: Abnormal   Collection Time: 03/30/22  3:21 AM  Result Value Ref Range   Sed Rate 53 (H) 0 - 16 mm/hr  C-reactive protein     Status: Abnormal   Collection Time: 03/30/22  3:21 AM  Result Value Ref Range   CRP 3.5 (H) <1.0  mg/dL  Magnesium     Status: None   Collection Time: 03/30/22  3:21 AM  Result Value Ref Range   Magnesium 1.7 1.7 - 2.4 mg/dL  Phosphorus     Status: None   Collection Time: 03/30/22  3:21 AM  Result Value Ref Range   Phosphorus 4.3 2.5 - 4.6 mg/dL  Lactic acid, plasma     Status: None   Collection Time: 03/30/22  3:21 AM  Result Value Ref Range   Lactic Acid, Venous 1.3 0.5 - 1.9 mmol/L  Glucose, capillary     Status: Abnormal   Collection Time: 03/30/22  7:25 AM  Result Value Ref Range   Glucose-Capillary 154 (H) 70 - 99 mg/dL   Recent Results (from the past 240 hour(s))  Blood Culture (routine x 2)     Status: None (Preliminary result)   Collection Time: 03/29/22  3:36 PM   Specimen: BLOOD  Result Value Ref Range Status   Specimen Description BLOOD SITE NOT SPECIFIED  Final   Special Requests   Final    BOTTLES DRAWN AEROBIC AND ANAEROBIC Blood Culture adequate volume   Culture   Final    NO GROWTH < 12 HOURS Performed at [facility]  [facility], [place] [address], [place], [place] [address]    Report Status PENDING  Incomplete  MRSA Next Gen by PCR, Nasal     Status: Abnormal   Collection Time: 03/29/22 10:39 PM   Specimen: Nasal Mucosa; Nasal Swab  Result Value Ref Range Status   MRSA by PCR Next Gen DETECTED (A) NOT DETECTED Final    Comment: RESULT CALLED TO, READ BACK BY AND VERIFIED WITH: R. PROCTER RN 03/30/22 @ 0128 BY AB (NOTE) The GeneXpert MRSA Assay (FDA approved for NASAL specimens only), is one component of a comprehensive MRSA colonization surveillance program. It is not intended to diagnose MRSA infection nor to guide or monitor treatment for MRSA infections. Test performance is not FDA approved in patients less than 96 years old. Performed at [facility], [place] [address] [place], [place] [address]     Renal Function: Recent Labs    03/29/22 1618 03/29/22 2355 03/30/22 0321  CREATININE 2.41* 1.80* 1.74*   Estimated Creatinine Clearance: 55.2  mL/min (A) (by C-G formula based on SCr of 1.74 mg/dL (H)).  Radiologic Imaging: CT ABDOMEN PELVIS WO CONTRAST  Result Date: 03/29/2022 CLINICAL DATA:  Pain EXAM: CT ABDOMEN AND PELVIS WITHOUT CONTRAST TECHNIQUE: Multidetector CT imaging of the abdomen and pelvis was performed following the standard protocol without IV contrast. RADIATION DOSE REDUCTION: This exam was performed according to the departmental dose-optimization program which includes automated exposure control, adjustment of the mA and/or kV according to patient size and/or use of iterative reconstruction technique. COMPARISON:  11/08/2021 FINDINGS: Lower chest: Ground-glass opacity consistent with pneumonitis or edema. No pericardial or pleural effusions. Atheromatous calcification coronary arteries. Hepatobiliary: Percutaneous drainage catheter right upper quadrant. Gallbladder not identified. No biliary ductal dilatation. Pancreas: Unremarkable. No pancreatic ductal dilatation or surrounding inflammatory changes. Adrenals/Urinary Tract: Left-sided hydronephrosis and hydroureter with a stone at the left UVJ measuring 6 mm. A few stones noted in the urinary bladder. There is a Foley catheter. Right-sided intrarenal 2 mm stones identified. Right kidney lesion consistent with a 2 cm cyst. Stomach/Bowel: Stomach is within normal limits. Appendix appears normal. No evidence of bowel wall thickening, distention, or inflammatory changes. Vascular/Lymphatic: No significant vascular findings are present. No enlarged abdominal or pelvic lymph nodes. Reproductive: Prostate is unremarkable. Other: Postop changes anterior abdominal wall repair with a mesh. Small periumbilical abdominal wall defect containing omental fat. Musculoskeletal: No acute or significant osseous findings. IMPRESSION: 1. Bilateral nephrolithiasis. 2. Left UVJ 6 mm stone with obstruction. 3. Right kidney cyst. 4. Right upper quadrant percutaneous drain. No fluid collections identified.  5. Anterior abdominal postop changes status post hernia repair with a small residual abdominal wall defect. Electronically Signed   By: Sammie Bench M.D.   On: 03/29/2022 18:56   DG Chest Port 1 View  Result Date: 03/29/2022 CLINICAL DATA:  Sepsis. EXAM: PORTABLE CHEST 1 VIEW COMPARISON:  November 06, 2021. FINDINGS: The heart size and mediastinal contours are within normal limits. Both lungs are clear. The visualized skeletal structures are unremarkable. IMPRESSION: No active disease. Electronically Signed   By: Marijo Conception M.D.   On: 03/29/2022 16:29    I independently reviewed the above imaging studies.  Impression/Recommendation 62 year old male with an obstructing 6 mm left UVJ stone associated with suspected urinary tract infection and AKI.  The risks, benefits and alternatives of cystoscopy with LEFT ureteroscopy, laser lithotripsy and ureteral stent placement was discussed the patient.  Risks included, but are not limited to: bleeding, urinary tract  infection, ureteral injury/avulsion, ureteral stricture formation, retained stone fragments, the possibility that multiple surgeries may be required to treat the stone(s), MI, stroke, PE and the inherent risks of general anesthesia.  The patient voices understanding and wishes to proceed.      Ellison Hughs, MD [facility] 03/30/2022, 8:18 AM

## 2022-03-30 NOTE — Op Note (Signed)
Operative Note  Preoperative diagnosis:  1.  6 mm left UVJ stone 2.  Possible UTI  Postoperative diagnosis: Same  Procedure(s): 1.  Cystoscopy with left ureteroscopy, holmium laser lithotripsy and left ureteral stent placement  Surgeon: Ellison Hughs, MD  Assistants:  None  Anesthesia:  General  Complications:  None  EBL: Less than 5 mL  Specimens: 1.  Left ureteral stone  Drains/Catheters: 1.  Left 6 French, 24 cm JJ stent with tether secured to his 33 [place] Foley catheter  Intraoperative findings:   6 mm left UVJ stone protruding from the UO No intravesical abnormalities  Indication:  Troy Randall is a 62 y.o. male with a 6 mm left UVJ stone with possible UTI.  He has a complex medical history including CVA that requires an indwelling Foley catheter.  He has been consented for the above procedures, voices understanding and wishes to proceed.  Description of procedure:  After informed consent was obtained, the patient was brought to the operating room and general LMA anesthesia was administered. The patient was then placed in the dorsolithotomy position and prepped and draped in the usual sterile fashion. A timeout was performed. A 23 French rigid cystoscope was then inserted into the urethral meatus and advanced into the bladder under direct vision. A complete bladder survey revealed no intravesical pathology.  A 5 French ureteral catheter was then inserted into the left ureteral orifice and a retrograde pyelogram was obtained, with the findings listed above.  A Glidewire was then used to intubate the lumen of the ureteral catheter and was advanced up to the left renal pelvis, under fluoroscopic guidance.  The catheter was then removed, leaving the wire in place.  A semirigid ureteroscope was then advanced into the very distal aspects of the left ureter where his stone was identified.  A 200 m holmium laser was used to fracture the stone into 2 smaller pieces.  A 0  tip basket was then used to extract all stone fragments from the lumen of the left ureter.  A 6 French, 24 cm JJ stent was then advanced over the wire and into good position within the left collecting system, confirming placement via fluoroscopy.  The tether of the stent was secured to his newly placed 58 French Foley catheter.  He tolerated the procedure well and was transferred to the postanesthesia in stable condition.  Plan: Transfer back to the floor.  Remove ureteral stent in 1 week and exchange Foley catheter.

## 2022-03-31 ENCOUNTER — Encounter (HOSPITAL_COMMUNITY): Payer: Self-pay | Admitting: Urology

## 2022-03-31 DIAGNOSIS — E871 Hypo-osmolality and hyponatremia: Secondary | ICD-10-CM | POA: Insufficient documentation

## 2022-03-31 DIAGNOSIS — L270 Generalized skin eruption due to drugs and medicaments taken internally: Secondary | ICD-10-CM | POA: Diagnosis not present

## 2022-03-31 DIAGNOSIS — I679 Cerebrovascular disease, unspecified: Secondary | ICD-10-CM | POA: Insufficient documentation

## 2022-03-31 DIAGNOSIS — N319 Neuromuscular dysfunction of bladder, unspecified: Secondary | ICD-10-CM | POA: Insufficient documentation

## 2022-03-31 LAB — GLUCOSE, CAPILLARY
Glucose-Capillary: 127 mg/dL — ABNORMAL HIGH (ref 70–99)
Glucose-Capillary: 136 mg/dL — ABNORMAL HIGH (ref 70–99)
Glucose-Capillary: 214 mg/dL — ABNORMAL HIGH (ref 70–99)
Glucose-Capillary: 222 mg/dL — ABNORMAL HIGH (ref 70–99)
Glucose-Capillary: 228 mg/dL — ABNORMAL HIGH (ref 70–99)
Glucose-Capillary: 246 mg/dL — ABNORMAL HIGH (ref 70–99)

## 2022-03-31 LAB — BASIC METABOLIC PANEL
Anion gap: 10 (ref 5–15)
BUN: 22 mg/dL — ABNORMAL HIGH (ref 6–20)
CO2: 21 mmol/L — ABNORMAL LOW (ref 22–32)
Calcium: 8.7 mg/dL — ABNORMAL LOW (ref 8.9–10.3)
Chloride: 103 mmol/L (ref 98–111)
Creatinine, Ser: 1.02 mg/dL (ref 0.61–1.24)
GFR, Estimated: >60 mL/min (ref >60)
Glucose, Bld: 241 mg/dL — ABNORMAL HIGH (ref 70–99)
Potassium: 3.8 mmol/L (ref 3.5–5.1)
Sodium: 134 mmol/L — ABNORMAL LOW (ref 135–145)

## 2022-03-31 LAB — CBC
HCT: 36.1 % — ABNORMAL LOW (ref 39.0–52.0)
Hemoglobin: 11.3 g/dL — ABNORMAL LOW (ref 13.0–17.0)
MCH: 27.2 pg (ref 26.0–34.0)
MCHC: 31.3 g/dL (ref 30.0–36.0)
MCV: 86.8 fL (ref 80.0–100.0)
Platelets: 175 10*3/uL (ref 150–400)
RBC: 4.16 MIL/uL — ABNORMAL LOW (ref 4.22–5.81)
RDW: 13.3 % (ref 11.5–15.5)
WBC: 6 10*3/uL (ref 4.0–10.5)
nRBC: 0 % (ref 0.0–0.2)

## 2022-03-31 LAB — URINE CULTURE

## 2022-03-31 LAB — HEMOGLOBIN A1C
Hgb A1c MFr Bld: 6.6 % — ABNORMAL HIGH (ref 4.8–5.6)
Mean Plasma Glucose: 143 mg/dL

## 2022-03-31 LAB — VANCOMYCIN, RANDOM: Vancomycin Rm: 7 ug/mL

## 2022-03-31 MED ORDER — SODIUM CHLORIDE 0.9 % IV SOLN
2.0000 g | Freq: Three times a day (TID) | INTRAVENOUS | Status: DC
  Filled 2022-03-31: qty 12.5

## 2022-03-31 MED ORDER — HALOPERIDOL LACTATE 5 MG/ML IJ SOLN: 2.0000 mg | Freq: Once | INTRAMUSCULAR | Status: DC

## 2022-03-31 MED ORDER — FLUOCINONIDE 0.05 % EX CREA
TOPICAL_CREAM | Freq: Two times a day (BID) | CUTANEOUS | Status: DC
  Administered 2022-04-01 – 2022-04-03 (×4): 1 via TOPICAL
  Filled 2022-03-31: qty 15

## 2022-03-31 MED ORDER — INSULIN GLARGINE-YFGN 100 UNIT/ML ~~LOC~~ SOLN
10.0000 [IU] | Freq: Every day | SUBCUTANEOUS | Status: DC
  Administered 2022-03-31 – 2022-04-03 (×4): 10 [IU] via SUBCUTANEOUS
  Filled 2022-03-31 (×4): qty 0.1

## 2022-03-31 MED ORDER — CEFAZOLIN SODIUM-DEXTROSE 2-4 GM/100ML-% IV SOLN
2.0000 g | Freq: Three times a day (TID) | INTRAVENOUS | Status: DC
  Administered 2022-03-31 – 2022-04-03 (×9): 2 g via INTRAVENOUS
  Filled 2022-03-31 (×11): qty 100

## 2022-03-31 MED ORDER — CEFAZOLIN SODIUM-DEXTROSE 2-4 GM/100ML-% IV SOLN: 2.0000 g | Freq: Three times a day (TID) | INTRAVENOUS | Status: DC

## 2022-03-31 MED ORDER — METHOCARBAMOL 500 MG PO TABS
500.0000 mg | ORAL_TABLET | Freq: Every day | ORAL | Status: DC
  Administered 2022-03-31 – 2022-04-02 (×3): 500 mg via ORAL
  Filled 2022-03-31 (×3): qty 1

## 2022-03-31 MED ORDER — HALOPERIDOL LACTATE 5 MG/ML IJ SOLN
2.0000 mg | Freq: Once | INTRAMUSCULAR | Status: AC | PRN
  Administered 2022-04-01: 2 mg via INTRAVENOUS
  Filled 2022-03-31 (×2): qty 1

## 2022-03-31 MED ORDER — DIPHENHYDRAMINE HCL 25 MG PO CAPS
25.0000 mg | ORAL_CAPSULE | Freq: Four times a day (QID) | ORAL | Status: DC | PRN
  Administered 2022-03-31 (×2): 25 mg via ORAL
  Filled 2022-03-31 (×2): qty 1

## 2022-03-31 MED ORDER — PANTOPRAZOLE SODIUM 40 MG PO TBEC
40.0000 mg | DELAYED_RELEASE_TABLET | Freq: Every day | ORAL | Status: DC
  Administered 2022-03-31 – 2022-04-03 (×4): 40 mg via ORAL
  Filled 2022-03-31 (×4): qty 1

## 2022-03-31 MED ORDER — LORATADINE 10 MG PO TABS
10.0000 mg | ORAL_TABLET | Freq: Every day | ORAL | Status: DC
  Administered 2022-04-01 – 2022-04-03 (×3): 10 mg via ORAL
  Filled 2022-03-31 (×5): qty 1

## 2022-03-31 MED ORDER — ROSUVASTATIN CALCIUM 20 MG PO TABS
40.0000 mg | ORAL_TABLET | Freq: Every day | ORAL | Status: DC
  Administered 2022-03-31 – 2022-04-03 (×4): 40 mg via ORAL
  Filled 2022-03-31 (×4): qty 2

## 2022-03-31 MED ORDER — TRAZODONE HCL 50 MG PO TABS
150.0000 mg | ORAL_TABLET | Freq: Every day | ORAL | Status: DC
  Administered 2022-03-31 – 2022-04-02 (×3): 150 mg via ORAL
  Filled 2022-03-31 (×3): qty 1

## 2022-03-31 MED ORDER — METHOCARBAMOL 500 MG PO TABS
500.0000 mg | ORAL_TABLET | Freq: Once | ORAL | Status: AC
  Administered 2022-03-31: 500 mg via ORAL
  Filled 2022-03-31: qty 1

## 2022-03-31 MED ORDER — METOPROLOL TARTRATE 25 MG PO TABS
25.0000 mg | ORAL_TABLET | Freq: Two times a day (BID) | ORAL | Status: DC
  Administered 2022-03-31 – 2022-04-02 (×5): 25 mg via ORAL
  Filled 2022-03-31 (×5): qty 1

## 2022-03-31 MED ORDER — VANCOMYCIN HCL 1250 MG/250ML IV SOLN
1250.0000 mg | Freq: Two times a day (BID) | INTRAVENOUS | Status: DC
  Administered 2022-03-31: 1250 mg via INTRAVENOUS
  Filled 2022-03-31: qty 250

## 2022-03-31 NOTE — Assessment & Plan Note (Signed)
Coccyx stage I, not present on admission

## 2022-03-31 NOTE — Assessment & Plan Note (Signed)
A1c 6.6% Hyperglycemia noted here - Hold finerenone - Resume glargine insulin - Continue SS corrections

## 2022-03-31 NOTE — Progress Notes (Signed)
Progress Note   Patient: Troy Randall [id number] DOB: 10/28/1960 DOA: 03/29/2022     1 DOS: the patient was seen and examined on 03/31/2022 at 8:25AM      Brief hospital course: Troy Randall is a 62 y.o. M with hx encephalitis, neurogenic bladder indwelling foley, DM, HTN, current UTI, GI bleed requiring embo, and cholecystitis with GB drain in place who presented with rash and fever.     3/24: Admitted to ICU at [facility], Urology consulted 3/25: Transferred to [place], underwent L ureteroscopy and stent, rash improving 3/26: AKI resolved, transferred OOU    Assessment and Plan: * Drug rash Initial concern for SJS given Bactrim, onset of rash on 7th day Abx.  Never tender however, no urogenital or oral desquamation.  No sloughing, bullae. Got prednisone here. Improving already.  Doubt SJS, Eos normal, probably typical drug exanthem. - Stop Benadryl, famotidine - Start topical steroid, non-drowsy H1RA, taper when itching subsides    Sepsis due to gram-negative UTI (HCC) P/w tachypnea, tachycardia.  Imaging showed UVJ stone with obstruction and UA showed pyuria.   S/P LEFT ureteral stent 3/25 UCx multiple species.  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  Dr. Garth Bigness note from 02/23/22 summarizes well: "He was neurologically baseline until early September 2023. He had a normal morning on 09/04/2021 but noted GI bleed prompting him to go to the ED. he had jejunal bleeding on colonoscopy treated with embolization. He was noted to have altered mental status a couple days later while in the hospital."    Had extensive work up with CSF studies, MRI during that admission, Dr. Garth Bigness notes do not conclude whether this is MS, ischemia/strokes, or some other  disease process.  Doesn't appear to be on any specific disease modifying treatment.  At baseline since last Sep, he can transfer with assist only, but can ambulate with a walker, has mild cognitive impairment. - Continue Robaxin, trazodone QHS - Follow up with Dr. Felecia Shelling    History of acute cholecystitis Has drain in place. - IR plan to replace drain  - Follow up with General Surgery and IR as previously planned   Hyponatremia Resolved  Cerebrovascular disease - Continue Crestor, metoprolol  Neurogenic bladder Has indwelling foley and history of recurrent UTIs.  No ESBL in our system. Foley replaced on admission - See above  Class 2 obesity BMI 30.02  Pressure injury of skin Coccyx stage I, not present on admission  AKI (acute kidney injury) ([place]) Baseline 0.6.  Cr 2.4 on admission, improved to 1.0 today - Stop fluids  Essential hypertension BP normal - Continue metoprolol  DM2 (diabetes mellitus, type 2) (HCC) A1c 6.6% Hyperglycemia noted here - Hold finerenone - Resume glargine insulin - Continue SS corrections          Subjective: Mentation has improved, has had no fevers, no respiratory symptoms.  He had PVCs on telemetry but these were asymptomatic and he has just restarted his metoprolol.  The rash is gradually getting better, no oral lesions or urogenital lesions.     Physical Exam: BP 120/85 (BP Location: Randall Arm)   Pulse 75   Temp (!) 97.5 F (36.4 C)   Resp 17   Ht 6' (1.829 m)   Wt 100.4 kg   SpO2  100%   BMI 30.02 kg/m   Elderly adult male, lying in bed, appears weak and tired RRR, no murmurs, no peripheral edema Respiratory rate normal, lungs clear without rales or wheezes Abdomen soft without tenderness palpation or guarding, gallbladder drain in place Makes eye contact, responds appropriate to questions, oriented, mild psychomotor slowing and forgetfulness apparent, generalized weakness but symmetric strength, speech fluent, face  symmetric There is a faint reddish maculopapular rash, mostly on the trunk, proximal extremities.  This is starting to flake but is not forming bulla, desquamation.  Lips are normal, there is maybe 1 aphthous ulcer in the back of his mouth, but no evidence of desquamation or anything else that looks like Troy Randall syndrome  Data Reviewed: Glucose elevated Creatinine down to 1.0, CBC unremarkable  Family Communication: Wife at the bedside    Disposition: Status is: Inpatient The patient was admitted with a kidney stone and urinary tract infection  He also had coincidentally had a new drug rash  His renal function is improving, his sepsis is stabilized, and if he has his gallbladder drain exchange today or tomorrow, likely home with home health PT tomorrow        Author: Edwin Dada, MD 03/31/2022 1:52 PM  For on call review [email or web address].

## 2022-03-31 NOTE — Progress Notes (Addendum)
Pharmacy Antibiotic Note  Troy Randall is a 62 y.o. male admitted on 03/29/2022 with UTI.  Pharmacy has been consulted for Ancef dosing.  ID: UTI/Sepsis s/p left ureteral stent placement 3/25.  Vanc 3/24 >>3/26 Cefepime 3/24 >>3/26  3/24 Bcx: NGTD 3/24 Ucx: multiple species 3/25 Bcx: sent MRSA +   Plan: - Change to Ancef 2g IV q8hr. Pharmacy will sign off. Please reconsult for further dosing assitance.    Height: 6' (182.9 cm) Weight: 100.4 kg (221 lb 5.5 oz) IBW/kg (Calculated) : 77.6  Temp (24hrs), Avg:97.6 F (36.4 C), Min:97.5 F (36.4 C), Max:97.8 F (36.6 C)  Recent Labs  Lab 03/29/22 1615 03/29/22 1618 03/29/22 2105 03/29/22 2355 03/30/22 0000 03/30/22 0321 03/31/22 0836  WBC  --  7.5  --  6.1  --  7.8 6.0  CREATININE  --  2.41*  --  1.80*  --  1.74* 1.02  LATICACIDVEN 2.9*  --  2.2*  --  2.0* 1.3  --   VANCORANDOM  --   --   --   --   --   --  7    Estimated Creatinine Clearance: 95.6 mL/min (by C-G formula based on SCr of 1.02 mg/dL).    Allergies  Allergen Reactions   Bactrim [Sulfamethoxazole-Trimethoprim] Rash   Amoxicillin Hives    Tolerated Cefepime   Rossana Molchan S. Alford Highland, PharmD, BCPS Clinical Staff Pharmacist [email or web address]  Wayland Salinas 03/31/2022 2:11 PM

## 2022-03-31 NOTE — Assessment & Plan Note (Addendum)
Initial concern for SJS given Bactrim, onset of rash on 7th day Abx.  Never tender however, no urogenital or oral desquamation.  No sloughing, bullae. Got prednisone here. Improving already.  Doubt SJS, Eos normal, probably typical drug exanthem. - Stop Benadryl, famotidine - Start topical steroid, non-drowsy H1RA, taper when itching subsides

## 2022-03-31 NOTE — Assessment & Plan Note (Signed)
-   Continue Crestor, metoprolol

## 2022-03-31 NOTE — Evaluation (Signed)
Physical Therapy Evaluation Patient Details Name: Troy Randall MRN: [id number] DOB: 10/02/1960 Today's Date: 03/31/2022  History of Present Illness  Patient is a 62 year old male who presented on 3/25 with AMS and generalized rash.wife reported that rash started on 3/23.  CT stone study revealed "an obstructing 6 mm left UVJ stone with mild to moderate left-sided hydronephrosis along with bilateral nonobstructing renal stones" patient was admitted with UTI, obstructing stone, and AKI. Patient underwent Cystoscopy with left ureteroscopy, holmium laser lithotripsy and left ureteral stent placement on 3/25. PMH: arthritis, CVA,  HTN, kidney stones, ankle surgery,  Clinical Impression  Pt admitted with above diagnosis.  Pt currently with functional limitations due to the deficits listed below (see PT Problem List). Pt will benefit from acute skilled PT to increase their independence and safety with mobility to allow discharge.     The patient's wife present to provide  POLF. Patient ambulatory with RW with assistance, patient has 24/7 caregivers.  Patient did ambulate with min assistance using RW(+1 for safety/lines).  Patient should progress to return home with renewal of HHPT.    Recommendations for follow up therapy are one component of a multi-disciplinary discharge planning process, led by the attending physician.  Recommendations may be updated based on patient status, additional functional criteria and insurance authorization.  Follow Up Recommendations  HHPT     Assistance Recommended at Discharge Frequent or constant Supervision/Assistance  Patient can return home with the following  A little help with walking and/or transfers;A little help with bathing/dressing/bathroom;Assistance with cooking/housework;Assist for transportation;Help with stairs or ramp for entrance    Equipment Recommendations None recommended by PT  Recommendations for Other Services       Functional Status  Assessment Patient has had a recent decline in their functional status and demonstrates the ability to make significant improvements in function in a reasonable and predictable amount of time.     Precautions / Restrictions Precautions Precautions: Fall Precaution Comments: impulsive Restrictions Weight Bearing Restrictions: No      Mobility  Bed Mobility Overal bed mobility: Needs Assistance Bed Mobility: Supine to Sit     Supine to sit: Min assist     General bed mobility comments: extra time , assist with trunk, pushed up with  left hand on therapist    Transfers Overall transfer level: Needs assistance Equipment used: Rolling walker (2 wheels) Transfers: Sit to/from Stand Sit to Stand: Min assist, +2 safety/equipment           General transfer comment: cues for reching back    Ambulation/Gait Ambulation/Gait assistance: Min assist Gait Distance (Feet): 20 Feet (x 2) Assistive device: Rolling walker (2 wheels) Gait Pattern/deviations: Step-to pattern, Step-through pattern, Decreased step length - left, Knee flexed in stance - left, Trunk flexed Gait velocity: decr     General Gait Details: cues for safety  Stairs            Wheelchair Mobility    Modified Rankin (Stroke Patients Only)       Balance Overall balance assessment: Needs assistance Sitting-balance support: Feet supported, Bilateral upper extremity supported Sitting balance-Leahy Scale: Fair     Standing balance support: During functional activity, Bilateral upper extremity supported, Reliant on assistive device for balance Standing balance-Leahy Scale: Poor                               Pertinent Vitals/Pain Pain Assessment Faces Pain Scale: No hurt  Home Living Family/patient expects to be discharged to:: Private residence Living Arrangements: Spouse/significant other Available Help at Discharge: Family;Available 24 hours/day;Other (Comment) Type of Home:  House Home Access: Stairs to enter Entrance Stairs-Rails: None Entrance Stairs-Number of Steps: 2   Home Layout: Able to live on main level with bedroom/bathroom;Laundry or work area in basement;One level Home Equipment: Wheelchair - Publishing copy (2 wheels);BSC/3in1 Additional Comments: HHPT recently signed off    Prior Function Prior Level of Function : Needs assist  Cognitive Assist : Mobility (cognitive);ADLs (cognitive) Mobility (Cognitive): Intermittent cues ADLs (Cognitive): Intermittent cues       Mobility Comments: assist with ambulation ADLs Comments: assisted by family     Hand Dominance   Dominant Hand: Right    Extremity/Trunk Assessment   Upper Extremity Assessment Upper Extremity Assessment: Defer to OT evaluation    Lower Extremity Assessment Lower Extremity Assessment: LLE deficits/detail LLE Deficits / Details: decreased step length    Cervical / Trunk Assessment Cervical / Trunk Assessment: Normal  Communication   Communication: Expressive difficulties  Cognition Arousal/Alertness: Awake/alert Behavior During Therapy: Impulsive, Flat affect Overall Cognitive Status: History of cognitive impairments - at baseline                                 General Comments: follows simple directions, oriented to  hospital, wants clothes to leave, wife present        General Comments      Exercises     Assessment/Plan    PT Assessment Patient needs continued PT services  PT Problem List Decreased mobility;Decreased safety awareness;Decreased knowledge of precautions;Decreased activity tolerance;Decreased balance;Decreased cognition;Decreased knowledge of use of DME       PT Treatment Interventions DME instruction;Therapeutic activities;Cognitive remediation;Gait training;Therapeutic exercise;Patient/family education;Functional mobility training;Balance training    PT Goals (Current goals can be found in the Care Plan section)   Acute Rehab PT Goals Patient Stated Goal: to go back  to Rehab-wife PT Goal Formulation: With patient/family Time For Goal Achievement: 04/14/22 Potential to Achieve Goals: Good    Frequency Min 3X/week     Co-evaluation               AM-PAC PT "6 Clicks" Mobility  Outcome Measure Help needed turning from your back to your side while in a flat bed without using bedrails?: A Little Help needed moving from lying on your back to sitting on the side of a flat bed without using bedrails?: A Little Help needed moving to and from a bed to a chair (including a wheelchair)?: A Little Help needed standing up from a chair using your arms (e.g., wheelchair or bedside chair)?: A Lot Help needed to walk in hospital room?: A Lot Help needed climbing 3-5 steps with a railing? : Total 6 Click Score: 14    End of Session Equipment Utilized During Treatment: Gait belt Activity Tolerance: Patient tolerated treatment well Patient left: in chair;with chair alarm set;with family/visitor present Nurse Communication: Mobility status PT Visit Diagnosis: Unsteadiness on feet (R26.81);Difficulty in walking, not elsewhere classified (R26.2);Other symptoms and signs involving the nervous system (R29.898)    Time: [id number] PT Time Calculation (min) (ACUTE ONLY): 23 min   Charges:   PT Evaluation $PT Eval Low Complexity: 1 Low          [place] Office [phone] Weekend [id number]   Claretha Cooper 03/31/2022, 10:27 AM

## 2022-03-31 NOTE — TOC Progression Note (Signed)
Transition of Care [facility]) - Progression Note    Patient Details  Name: Troy Randall MRN: [id number] Date of Birth: 1960-11-19  Transition of Care [facility]) CM/SW Contact  Purcell Mouton, RN Phone Number: 03/31/2022, 12:03 PM  Clinical Narrative:     Pt from home with spouse. Spoke with Mrs [place], pt will return home with [place] for [facility]. Will need HHRN/PT orders.   Expected Discharge Plan: [place] Barriers to Discharge: No Barriers Identified  Expected Discharge Plan and Services       Living arrangements for the past 2 months: [place]: PT HH Agency: [place] Date [place]: 03/31/22 Time [place]: 1202 Representative spoke with at [place]: [place] Determinants of Health ([place]) Interventions SDOH Screenings   Food Insecurity: No Food Insecurity (09/29/2021)  Housing: Low Risk  (09/29/2021)  Transportation Needs: No Transportation Needs (09/29/2021)  Utilities: Not At Risk (09/29/2021)  Depression (PHQ2-9): High Risk (01/08/2022)  Tobacco Use: Medium Risk (03/31/2022)    Readmission Risk Interventions     No data to display

## 2022-03-31 NOTE — Assessment & Plan Note (Signed)
Has indwelling foley and history of recurrent UTIs.  No ESBL in our system. Foley replaced on admission - See above

## 2022-03-31 NOTE — Assessment & Plan Note (Addendum)
P/w tachypnea, tachycardia.  Imaging showed UVJ stone with obstruction and UA showed pyuria.   S/P LEFT ureteral stent 3/25 UCx multiple species.  D/w ID, narrowing to cephalosporin reasonable - Continue IV antibiotics, narrow to cefazolin here - Plan to d/c on cefadroxil 1g BID - Needs Urology follow up for stent

## 2022-03-31 NOTE — Hospital Course (Signed)
Troy Randall is a 62 y.o. M with hx encephalitis, neurogenic bladder indwelling foley, DM, HTN, current UTI, GI bleed requiring embo, and cholecystitis with GB drain in place who presented with rash and fever.     3/24: Admitted to ICU at [facility], Urology consulted 3/25: Transferred to [place], underwent L ureteroscopy and stent, rash improving

## 2022-03-31 NOTE — Assessment & Plan Note (Signed)
Resolved

## 2022-03-31 NOTE — Progress Notes (Signed)
Pharmacy Antibiotic Note  Troy Randall is a 62 y.o. male admitted on 03/29/2022 with sepsis. Patient found to have left UVJ stone and underwent left ureteral stent placement. Pharmacy has been consulted for cefepime and vancomycin dosing.   Patient with significant AKI, Scr: 2.41 on admission. His renal function has now improved, but still minimally elevated from his baseline < 1.0.   Plan: -Change cefepime to 2 g q8h  -Start vancomycin 1250 mg IV q12h -Follow culture data for de-escalation as indicated  Microbiology Data: 3/24 Ucx: multiple species 3/24 Bcx: NGTD 3/24 MRSA PCR: positive  Height: 6' (182.9 cm) Weight: 100.4 kg (221 lb 5.5 oz) IBW/kg (Calculated) : 77.6  Temp (24hrs), Avg:97.4 F (36.3 C), Min:97.2 F (36.2 C), Max:97.8 F (36.6 C)  Recent Labs  Lab 03/29/22 1615 03/29/22 1618 03/29/22 2105 03/29/22 2355 03/30/22 0000 03/30/22 0321 03/31/22 0836  WBC  --  7.5  --  6.1  --  7.8 6.0  CREATININE  --  2.41*  --  1.80*  --  1.74* 1.02  LATICACIDVEN 2.9*  --  2.2*  --  2.0* 1.3  --   VANCORANDOM  --   --   --   --   --   --  7     Estimated Creatinine Clearance: 95.6 mL/min (by C-G formula based on SCr of 1.02 mg/dL).    Allergies  Allergen Reactions   Bactrim [Sulfamethoxazole-Trimethoprim] Rash   Amoxicillin Hives   Thank you for allowing pharmacy to be a part of this patient's care.  Tawnya Crook, PharmD, BCPS Clinical Pharmacist 03/31/2022 10:04 AM

## 2022-03-31 NOTE — Assessment & Plan Note (Addendum)
This appears to be an as yet undifferentiated encephalomyelopathy.  Dr. Garth Bigness note from 02/23/22 summarizes well: "He was neurologically baseline until early September 2023. He had a normal morning on 09/04/2021 but noted GI bleed prompting him to go to the ED. he had jejunal bleeding on colonoscopy treated with embolization. He was noted to have altered mental status a couple days later while in the hospital."    Had extensive work up with CSF studies, MRI during that admission, Dr. Garth Bigness notes do not conclude whether this is MS, ischemia/strokes, or some other disease process.  Doesn't appear to be on any specific disease modifying treatment.  At baseline since last Sep, he can transfer with assist only, but can ambulate with a walker, has mild cognitive impairment. - Continue Robaxin, trazodone QHS - Follow up with Dr. Felecia Shelling

## 2022-03-31 NOTE — Plan of Care (Signed)
  Problem: Skin Integrity: Goal: Risk for impaired skin integrity will decrease Outcome: Progressing   

## 2022-03-31 NOTE — Assessment & Plan Note (Addendum)
Baseline 0.6.  Cr 2.4 on admission, improved to 1.0 today - Stop fluids

## 2022-03-31 NOTE — Evaluation (Signed)
Occupational Therapy Evaluation Patient Details Name: Troy Randall MRN: [id number] DOB: [birth year] Today's Date: 03/31/2022   History of Present Illness Patient is a 62 year old male who presented on 3/25 with AMS and generalized rash.wife reported that rash started on 3/23.  CT stone study revealed "an obstructing 6 mm left UVJ stone with mild to moderate left-sided hydronephrosis along with bilateral nonobstructing renal stones" patient was admitted with UTI, obstructing stone, and AKI. Patient underwent Cystoscopy with left ureteroscopy, holmium laser lithotripsy and left ureteral stent placement on 3/25. PMH: arthritis, CVA,  HTN, kidney stones, ankle surgery,   Clinical Impression   Patient is a 62 year old male who was admitted for above. Patient lives at home with 24/7 caregiver support from wife with RW and wheelchair. Currently, patient is min A with +2 for line management to get from bed to bathroom and back. Patient required increased A for LB dressing/bathing tasks with decreased functional activity tolerance noted during session. Patient would continue to benefit from skilled OT services at this time while admitted and after d/c to address noted deficits in order to improve overall safety and independence in ADLs.       Recommendations for follow up therapy are one component of a multi-disciplinary discharge planning process, led by the attending physician.  Recommendations may be updated based on patient status, additional functional criteria and insurance authorization.   Assistance Recommended at Discharge Frequent or constant Supervision/Assistance  Patient can return home with the following A little help with walking and/or transfers;Assistance with cooking/housework;Direct supervision/assist for medications management;Assist for transportation;Direct supervision/assist for financial management;Help with stairs or ramp for entrance;A little help with bathing/dressing/bathroom     Functional Status Assessment     Equipment Recommendations  None recommended by OT       Precautions / Restrictions Precautions Precautions: Fall Precaution Comments: impulsive Restrictions Weight Bearing Restrictions: No      Mobility Bed Mobility Overal bed mobility: Needs Assistance Bed Mobility: Supine to Sit     Supine to sit: Min assist     General bed mobility comments: extra time , assist with trunk, pushed up with  left hand on therapist    Transfers Overall transfer level: Needs assistance Equipment used: Rolling walker (2 wheels) Transfers: Sit to/from Stand Sit to Stand: Min assist, +2 safety/equipment           General transfer comment: cues for reching back          ADL either performed or assessed with clinical judgement   ADL Overall ADL's : Needs assistance/impaired Eating/Feeding: Supervision/ safety;Sitting   Grooming: Set up;Supervision/safety;Sitting   Upper Body Bathing: Sitting;Min guard;Set up   Lower Body Bathing: Minimal assistance;Sit to/from stand;Sitting/lateral leans   Upper Body Dressing : Min guard;Set up;Sitting   Lower Body Dressing: Sitting/lateral leans;Sit to/from stand;Maximal assistance   Toilet Transfer: Minimal assistance;+2 for safety/equipment;Ambulation;Rolling walker (2 wheels) Toilet Transfer Details (indicate cue type and reason): line management to and from bathroom Toileting- Clothing Manipulation and Hygiene: Min guard;Sit to/from stand Toileting - Clothing Manipulation Details (indicate cue type and reason): with mod cues for processing                          Pertinent Vitals/Pain Pain Assessment Pain Assessment: Faces Faces Pain Scale: No hurt     Hand Dominance Right   Extremity/Trunk Assessment Upper Extremity Assessment Upper Extremity Assessment: Overall WFL for tasks assessed   Lower Extremity Assessment Lower  Extremity Assessment: Defer to PT evaluation LLE Deficits /  Details: decreased step length   Cervical / Trunk Assessment Cervical / Trunk Assessment: Normal   Communication Communication Communication: Expressive difficulties   Cognition Arousal/Alertness: Awake/alert Behavior During Therapy: Flat affect Overall Cognitive Status: History of cognitive impairments - at baseline                                 General Comments: wife reported patient was at his cognitive baseline at this time                Home Living Family/patient expects to be discharged to:: Private residence Living Arrangements: Spouse/significant other Available Help at Discharge: Family;Available 24 hours/day;Other (Comment) Type of Home: House Home Access: Stairs to enter   Entrance Stairs-Rails: None Home Layout: Able to live on main level with bedroom/bathroom;Laundry or work area in basement;One level     Bathroom Shower/Tub: Occupational psychologist: Handicapped height Bathroom Accessibility: No   Home Equipment: Wheelchair - Publishing copy (2 wheels);BSC/3in1   Additional Comments: HHPT recently signed off      Prior Functioning/Environment Prior Level of Function : Needs assist  Cognitive Assist : Mobility (cognitive);ADLs (cognitive) Mobility (Cognitive): Intermittent cues ADLs (Cognitive): Intermittent cues       Mobility Comments: assist with ambulation ADLs Comments: assisted by family        OT Problem List: Decreased activity tolerance;Impaired balance (sitting and/or standing);Decreased safety awareness      OT Treatment/Interventions: Self-care/ADL training;Therapeutic exercise;Energy conservation;Patient/family education;Balance training;DME and/or AE instruction    OT Goals(Current goals can be found in the care plan section) Acute Rehab OT Goals Patient Stated Goal: none stated OT Goal Formulation: With patient/family Time For Goal Achievement: 04/14/22 Potential to Achieve Goals: Fair  OT  Frequency: Min 2X/week    Co-evaluation PT/OT/SLP Co-Evaluation/Treatment: Yes Reason for Co-Treatment: To address functional/ADL transfers;Necessary to address cognition/behavior during functional activity          AM-PAC OT "6 Clicks" Daily Activity     Outcome Measure Help from another person eating meals?: A Little Help from another person taking care of personal grooming?: A Little Help from another person toileting, which includes using toliet, bedpan, or urinal?: A Little Help from another person bathing (including washing, rinsing, drying)?: A Lot Help from another person to put on and taking off regular upper body clothing?: A Little Help from another person to put on and taking off regular lower body clothing?: A Lot 6 Click Score: 16   End of Session Equipment Utilized During Treatment: Gait belt;Rolling walker (2 wheels) Nurse Communication: Other (comment) (ok to participate, updated on participation after session)  Activity Tolerance: Patient tolerated treatment well Patient left: in chair;with call bell/phone within reach;with family/visitor present;with chair alarm set  OT Visit Diagnosis: Unsteadiness on feet (R26.81);Other abnormalities of gait and mobility (R26.89);Muscle weakness (generalized) (M62.81)                Time: [id number] OT Time Calculation (min): 24 min Charges:  OT General Charges $OT Visit: 1 Visit OT Evaluation $OT Eval Low Complexity: 1 Low  Ata Pecha OTR/L, MS Acute Rehabilitation Department Office# [phone]   Willa Rough 03/31/2022, 1:56 PM

## 2022-03-31 NOTE — Progress Notes (Signed)
Inpatient Diabetes Program Recommendations  AACE/ADA: New Consensus Statement on Inpatient Glycemic Control (2015)  Target Ranges:  Prepandial:   less than 140 mg/dL      Peak postprandial:   less than 180 mg/dL (1-2 hours)      Critically ill patients:  140 - 180 mg/dL   Lab Results  Component Value Date   GLUCAP 214 (H) 03/31/2022   HGBA1C 6.6 (H) 03/29/2022    Review of Glycemic Control  Latest Reference Range & Units 03/30/22 15:25 03/30/22 19:55 03/31/22 00:08 03/31/22 04:20 03/31/22 07:58 03/31/22 11:59  Glucose-Capillary 70 - 99 mg/dL 217 (H) 178 (H) 228 (H) 246 (H) 222 (H) 214 (H)   Diabetes history: DM Outpatient Diabetes medications:  Tresiba 16 units bid (blood sugars must be >129 mg/dL)  Current orders for Inpatient glycemic control:  Novolog 0-9 units q 4 hours Semglee 10 units daily  Inpatient Diabetes Program Recommendations:    Note Semglee 10 units added today.  Patient did receive Decadron 10 mg x1 on 3/25 which likely has increased blood sugars.  Will follow.   Thanks,   Adah Perl, RN, BC-ADM Inpatient Diabetes Coordinator Pager [phone]  (8a-5p)

## 2022-03-31 NOTE — Progress Notes (Signed)
Report called to Lovell Sheehan RN. All questions answered at this time. All personal care items and paper chart transported with patient. Patient was transported upstairs on RA by NT. Handoff completed.

## 2022-03-31 NOTE — Assessment & Plan Note (Signed)
BP normal -Continue metoprolol 

## 2022-03-31 NOTE — Assessment & Plan Note (Signed)
Has drain in place. - Follow up with General Surgery and IR as previously planned

## 2022-03-31 NOTE — Assessment & Plan Note (Signed)
BMI 30.02

## 2022-04-01 ENCOUNTER — Inpatient Hospital Stay (HOSPITAL_COMMUNITY): Payer: Medicare HMO

## 2022-04-01 DIAGNOSIS — L270 Generalized skin eruption due to drugs and medicaments taken internally: Secondary | ICD-10-CM | POA: Diagnosis not present

## 2022-04-01 HISTORY — PX: IR EXCHANGE BILIARY DRAIN: IMG6046

## 2022-04-01 LAB — GLUCOSE, CAPILLARY
Glucose-Capillary: 116 mg/dL — ABNORMAL HIGH (ref 70–99)
Glucose-Capillary: 119 mg/dL — ABNORMAL HIGH (ref 70–99)
Glucose-Capillary: 126 mg/dL — ABNORMAL HIGH (ref 70–99)
Glucose-Capillary: 152 mg/dL — ABNORMAL HIGH (ref 70–99)
Glucose-Capillary: 116 mg/dL — ABNORMAL HIGH (ref 70–99)

## 2022-04-01 LAB — BASIC METABOLIC PANEL
Anion gap: 8 (ref 5–15)
BUN: 17 mg/dL (ref 6–20)
CO2: 20 mmol/L — ABNORMAL LOW (ref 22–32)
Calcium: 8.4 mg/dL — ABNORMAL LOW (ref 8.9–10.3)
Chloride: 109 mmol/L (ref 98–111)
Creatinine, Ser: 0.97 mg/dL (ref 0.61–1.24)
GFR, Estimated: >60 mL/min (ref >60)
Glucose, Bld: 126 mg/dL — ABNORMAL HIGH (ref 70–99)
Potassium: 3.7 mmol/L (ref 3.5–5.1)
Sodium: 137 mmol/L (ref 135–145)

## 2022-04-01 MED ORDER — IOHEXOL 300 MG/ML  SOLN
50.0000 mL | Freq: Once | INTRAMUSCULAR | Status: AC | PRN
  Administered 2022-04-01: 20 mL

## 2022-04-01 MED ORDER — INSULIN ASPART 100 UNIT/ML IJ SOLN
0.0000 [IU] | Freq: Three times a day (TID) | INTRAMUSCULAR | Status: DC
  Administered 2022-04-02: 1 [IU] via SUBCUTANEOUS

## 2022-04-01 MED ORDER — LIDOCAINE HCL 1 % IJ SOLN
INTRAMUSCULAR | Status: AC
  Filled 2022-04-01: qty 20

## 2022-04-01 MED ORDER — LORAZEPAM 2 MG/ML IJ SOLN
1.0000 mg | Freq: Once | INTRAMUSCULAR | Status: AC
  Administered 2022-04-01: 2 mg via INTRAVENOUS
  Filled 2022-04-01: qty 1

## 2022-04-01 MED ORDER — GADOBUTROL 1 MMOL/ML IV SOLN
9.0000 mL | Freq: Once | INTRAVENOUS | Status: AC | PRN
  Administered 2022-04-01: 9 mL via INTRAVENOUS

## 2022-04-01 NOTE — Procedures (Signed)
Pre procedural Dx: Chronic cholecystomy tube. Post procedural Dx: Same  Technically successful Fluoro guided exchange of 12 Fr chole tube. Chole tube connected to gravity bag.  EBL: None Complications: None immediate - Note, will obtain non-con CT of the abdomen to ensure appropriate positioning of the chole tube as the gall bladder is extremely scarred down as has been demonstrated on prior exchanges.  Ronny Bacon, MD Pager #: [phone]

## 2022-04-01 NOTE — Progress Notes (Addendum)
Mobility Specialist - Progress Note   04/01/22 1251  Mobility  Activity Transferred from bed to chair  Level of Assistance Contact guard assist, steadying assist  Assistive Device Front wheel walker  Distance Ambulated (ft) 5 ft  Activity Response Tolerated well  Mobility Referral Yes  $Mobility charge 1 Mobility   Pt received in bed and agreed to transfer. No c/o pain nor discomfort. Pt to chair with all needs met with family in room.   Roderick Pee Mobility Specialist

## 2022-04-01 NOTE — Progress Notes (Signed)
SLP Cancellation Note  Patient Details Name: Troy Randall MRN: [id number] DOB: 1960/03/17   Cancelled treatment:       Reason Eval/Treat Not Completed: Other (comment) (pt at IR at this time undergoing procedure)  Kathleen Lime, MS [facility] SLP Acute Rehab Services Office [phone]  [name] 04/01/2022, 3:35 PM

## 2022-04-01 NOTE — Progress Notes (Signed)
PROGRESS NOTE   Troy Randall  [id number] DOB: 27-Dec-1960 DOA: 03/29/2022 PCP: [name], MD  Brief Narrative:  62 year old white male DM TY 2 HTN HLD  Lengthy hospitalization 8/31 through 10/30/2021-at that hospitalization underwent 20 units PRBC transfusion sigmoidoscopy and had toxic metabolic encephalopathy consistent with active demyelination --received 2 plasma exchange X5 doses at that hospitalization and now currently follows with Dr. Rachel Moulds of neurology in the outpatient setting--- at that visit he also had E. coli cholecystitis and a PERC drain placed on 09/07/2021 and IR continues to follow CVA X2 --apparent full recovery from both latest was 04/2021 on aspirin Plavix previously Recent inpatient hospitalization again 11/8 through 11/22 with fevers and was transferred to [facility] and admitted with SVT and sepsis workup and grew E. coli and was treated with IV cephalosporins and indwelling Foley catheter was maintained at that time  Present [facility] ED 3/24 with rash and confusion and was recently Rx Bactrim-it was not felt to be Stevens-Johnson patient was found to have mild AKI CT abdomen pelvis = 6 mm right UVJ stone Initial Rx Benadryl 3/25 transfer [facility]--> [facility] 2/2 need for urological procedures secondary to moderate left-sided hydronephrosis along with bilateral nonobstructing renal stones--underwent cystoscopy with left ureteroscopy and left ureteric double-J stent placement to 68 French Foley catheter tethering 3/26 transferred to [facility]  Hospital-Problem based course  Sepsis secondary to gram-negative UTI from probable UVJ stone - Prior hospitalist discussed with ID and transitioning to cefazolin IV - On DC will discharge on cefadroxil 1 g twice daily to complete 7-day course - Urology plans follow-up in 1 week-CC Dr. Lovena Neighbours at discharge - Blood culture X2   [-]  Acute kidney injury present on admission - BUNs/creatinine 25/2.4-->17/0.9 - Saline  lock, kidney function has improved post urological procedure - Still mildly acidotic monitor trends  Drug rash initial concern Katherina Right syndrome - Possible chest drug exanthem and Benadryl and famotidine stopped - Continue Lidex twice daily, loratadine 10 daily  Acute disseminated encephalomyelitis? -Follows with neurology Dr. Nash Mantis had extensive workup it is believed with CSF studies and MRI - Getting MRI  - Continues on Robaxin 500 at bedtime, trazodone 150 at bedtime  Prior cholecystitis and with cholecystotomy in place since 09/2021 - IR drain replacement 3/27 in process - Will require further IR follow-up CC IR physician on discharge to ensure this is done  Chronic indwelling Foley - Foley replaced at admission - Outpatient urology follow-up  HTN + prior CV disease - Continue metoprolol 25 twice daily, Crestor 40 daily  DM TY 2 A1c 6.6 here - CBGs 1 20-1 60 - Continue Lantus 10 units--holding Kerendia - Eating 85 to 100% of meals continue sliding scale  Hyponatremia-resolved - Resolved and is now 137  DVT prophylaxis: SCD Code Status: Full Family Communication: Discussed with the wife at the bedside Disposition:  Status is: Inpatient Remains inpatient appropriate because:   Requires further monitoring    Subjective: Awake but intermittently confused-tracks fairly well can remember his wife's name and children's name but cannot tell me time date year This seems to be his baseline according to wife Note that patient walked 5 feet with mobility  Objective: Vitals:   03/31/22 2032 04/01/22 0445 04/01/22 0500 04/01/22 1213  BP: 123/74 106/71  124/77  Pulse: 74 72  80  Resp: 19 19  18   Temp: 97.6 F (36.4 C) 97.6 F (36.4 C)  (!) 97.4 F (36.3 C)  TempSrc: Oral  SpO2: 100% 97%  99%  Weight:   97.1 kg   Height:        Intake/Output Summary (Last 24 hours) at 04/01/2022 1418 Last data filed at 04/01/2022 0900 Gross per 24 hour  Intake 713 ml   Output 2490 ml  Net -1777 ml   Filed Weights   03/30/22 1355 03/31/22 0441 04/01/22 0500  Weight: 98.8 kg 100.4 kg 97.1 kg    Examination:  Ill-appearing white male looking older than stated age moderately nourished and built No icterus no pallor Neck soft supple S1-S2 no murmur Abdomen soft no rebound He does have a cholecystotomy tube that is draining freely he also has a Foley catheter in place He is able to straight leg raise bilaterally Power is 5/5 I did not perform coordination testing Reflexes are actually brisk in quadriceps  Data Reviewed: personally reviewed   CBC    Component Value Date/Time   WBC 6.0 03/31/2022 0836   RBC 4.16 (L) 03/31/2022 0836   HGB 11.3 (L) 03/31/2022 0836   HCT 36.1 (L) 03/31/2022 0836   PLT 175 03/31/2022 0836   MCV 86.8 03/31/2022 0836   MCH 27.2 03/31/2022 0836   MCHC 31.3 03/31/2022 0836   RDW 13.3 03/31/2022 0836   LYMPHSABS 0.6 (L) 03/30/2022 0321   MONOABS 0.3 03/30/2022 0321   EOSABS 0.3 03/30/2022 0321   BASOSABS 0.0 03/30/2022 0321      Latest Ref Rng & Units 04/01/2022    4:16 AM 03/31/2022    8:36 AM 03/30/2022    3:21 AM  CMP  Glucose 70 - 99 mg/dL 126  241  119   BUN 6 - 20 mg/dL 17  22  22    Creatinine 0.61 - 1.24 mg/dL 0.97  1.02  1.74   Sodium 135 - 145 mmol/L 137  134  131   Potassium 3.5 - 5.1 mmol/L 3.7  3.8  4.3   Chloride 98 - 111 mmol/L 109  103  99   CO2 22 - 32 mmol/L 20  21  19    Calcium 8.9 - 10.3 mg/dL 8.4  8.7  8.6   Total Protein 6.5 - 8.1 g/dL   6.1   Total Bilirubin 0.3 - 1.2 mg/dL   1.0   Alkaline Phos 38 - 126 U/L   58   AST 15 - 41 U/L   35   ALT 0 - 44 U/L   33      Radiology Studies: No results found.   Scheduled Meds:  Chlorhexidine Gluconate Cloth  6 each Topical Q0600   fluocinonide cream   Topical BID   insulin aspart  0-9 Units Subcutaneous Q4H   insulin glargine-yfgn  10 Units Subcutaneous Daily   loratadine  10 mg Oral Daily   methocarbamol  500 mg Oral QHS    metoprolol tartrate  25 mg Oral BID   mupirocin ointment  1 Application Nasal BID   pantoprazole  40 mg Oral Daily   rosuvastatin  40 mg Oral Daily   traZODone  150 mg Oral QHS   Continuous Infusions:  sodium chloride 10 mL/hr at 03/30/22 0859    ceFAZolin (ANCEF) IV 2 g (04/01/22 1405)     LOS: 2 days   Time spent: [place], MD [facility] To contact the attending provider between 7A-7P or the covering provider during after hours 7P-7A, please log into the web site [email or web address] and access using universal [place] password for that web site. If  you do not have the password, please call the hospital operator.  04/01/2022, 2:18 PM

## 2022-04-02 DIAGNOSIS — L270 Generalized skin eruption due to drugs and medicaments taken internally: Secondary | ICD-10-CM | POA: Diagnosis not present

## 2022-04-02 LAB — RENAL FUNCTION PANEL
Albumin: 3 g/dL — ABNORMAL LOW (ref 3.5–5.0)
Anion gap: 7 (ref 5–15)
BUN: 13 mg/dL (ref 6–20)
CO2: 23 mmol/L (ref 22–32)
Calcium: 8.5 mg/dL — ABNORMAL LOW (ref 8.9–10.3)
Chloride: 109 mmol/L (ref 98–111)
Creatinine, Ser: 0.92 mg/dL (ref 0.61–1.24)
GFR, Estimated: >60 mL/min (ref >60)
Glucose, Bld: 106 mg/dL — ABNORMAL HIGH (ref 70–99)
Phosphorus: 3.4 mg/dL (ref 2.5–4.6)
Potassium: 3.9 mmol/L (ref 3.5–5.1)
Sodium: 139 mmol/L (ref 135–145)

## 2022-04-02 LAB — GLUCOSE, CAPILLARY
Glucose-Capillary: 105 mg/dL — ABNORMAL HIGH (ref 70–99)
Glucose-Capillary: 132 mg/dL — ABNORMAL HIGH (ref 70–99)
Glucose-Capillary: 117 mg/dL — ABNORMAL HIGH (ref 70–99)
Glucose-Capillary: 146 mg/dL — ABNORMAL HIGH (ref 70–99)

## 2022-04-02 MED ORDER — METOPROLOL SUCCINATE ER 25 MG PO TB24
12.5000 mg | ORAL_TABLET | Freq: Every day | ORAL | Status: DC
  Administered 2022-04-03: 12.5 mg via ORAL
  Filled 2022-04-02: qty 1

## 2022-04-02 NOTE — Progress Notes (Signed)
PROGRESS NOTE   Troy Randall  [id number] DOB: May 20, 1960 DOA: 03/29/2022 PCP: [name], MD  Brief Narrative:  62 year old white male DM TY 2 HTN HLD  History Below: Lengthy hospitalization 8/31 through 10/30/2021-at that hospitalization underwent 20 units PRBC transfusion sigmoidoscopy and had toxic metabolic encephalopathy consistent with active demyelination --received 2 plasma exchange X5 doses at that hospitalization and now currently follows with Dr. Rachel Moulds of neurology in the outpatient setting--- at that visit he also had E. coli cholecystitis and a PERC drain placed on 09/07/2021 and IR continues to follow  CVA X2 --apparent full recovery from both latest was 04/2021 on aspirin Plavix previously Recent inpatient hospitalization again 11/8 through 11/22 with fevers and was transferred to [facility] and admitted with SVT and sepsis workup and grew E. coli and was treated with IV cephalosporins and indwelling Foley catheter was maintained at that time  Present [facility] ED 3/24 with rash and confusion and was recently Rx Bactrim-it was not felt to be Stevens-Johnson patient was found to have mild AKI CT abdomen pelvis = 6 mm right UVJ stone Initial Rx Benadryl 3/25 transfer [facility] 2/2 need for urological procedures secondary to moderate left-sided hydronephrosis along with bilateral nonobstructing renal stones--underwent cystoscopy with left ureteroscopy and left ureteric double-J stent placement to 16 French Foley catheter tethering 3/26 transferred to [facility] 3/27 IR replaced cholecystotomy drain   Hospital-Problem based course  Sepsis secondary to gram-negative UTI from probable UVJ stone Hemodynamically stable today. S/p cystoscopy with left ureteroscopy and left ureteric double-J stent placement to 16 French Foley catheter tethering. Plan per urology, remove ureteral stent in 1 week from 03/30/22 and exchange Foley catheter.  - Urology to remove in one  week - Vitals per floor protocol    Acute kidney injury present on admission - BUNs/creatinine 17/0.9>13/0.9 - Saline lock, kidney function has improved post urological procedure  Drug rash initial concern Katherina Right syndrome Improving today on exam  - Possible chest drug exanthem and Benadryl and famotidine stopped - Continue Lidex twice daily, loratadine 10 daily  Acute disseminated encephalomyelitis? Follows with neurology Dr. Nash Mantis had extensive workup it is believed with CSF studies and MRI. UTD MRI of head unchanged from previous imaging  - Continues on Robaxin 500 at bedtime, trazodone 150 at bedtime -- F/u with Dr. Felecia Shelling outpatient   Prior cholecystitis and with cholecystotomy in place since 09/2021 - IR drain replacement successful  - IR follow-up [facility] IR physician on discharge   Chronic indwelling Foley - Foley replaced at admission - Outpatient urology follow-up  HTN + prior CV disease Hypotensive episodes  At home, wife reports that they intermittently hold his home Metoprolol with BP parameters in case of hypotension. Today, has had a couple of soft pressures, likely in the setting of recent sedative and metoprolol given this AM. Will obtain orthostatics today and ambulate patient as able.  -- Consider decrease in Metoprolol to 12.5 BID  -- Orthostatics   DM TY 2 A1c 6.6 here - CBGs 100s-110s  - Continue Lantus 10 units--holding Kerendia - Continue sliding scale  Hyponatremia-resolved  DVT prophylaxis: SCD Code Status: Full Family Communication: Discussed with the wife at the bedside Disposition:  Status is: Inpatient Remains inpatient appropriate because:   Requires further monitoring, will assess blood pressure and mentation later in the day prior to discharge.     Subjective: Wife at bedside providing history. Patient had procedure performed yesterday that was successful. He is still groggy from  receiving Ativan. Wife is slightly concerned that  he had a slightly low pressure this AM and he was still given he BP medication.   Objective: Vitals:   04/01/22 1213 04/01/22 1725 04/01/22 2028 04/02/22 0511  BP: 124/77 129/77 129/88 95/67  Pulse: 80 78 78 69  Resp: 18 18 17 17   Temp: (!) 97.4 F (36.3 C) 97.7 F (36.5 C) 98 F (36.7 C) 97.8 F (36.6 C)  TempSrc:    Oral  SpO2: 99% 100% 98% 96%  Weight:    98.4 kg  Height:        Intake/Output Summary (Last 24 hours) at 04/02/2022 1224 Last data filed at 04/02/2022 [id number] Gross per 24 hour  Intake 5 ml  Output 660 ml  Net -655 ml   Filed Weights   03/31/22 0441 04/01/22 0500 04/02/22 0511  Weight: 100.4 kg 97.1 kg 98.4 kg    Examination:  General: Alert in no apparent distress; tracks with eyes appropriately  Heart: Regular rate and rhythm with no murmurs appreciated Lungs: CTA bilaterally, no wheezing Abdomen: Bowel sounds present, no abdominal pain Skin: Warm and dry, macular, red, diffuse rash on legs and arms, chest  Extremities: No lower extremity edema Neuro: Tracking with eyes, falls commands, briefly falls asleep able to awaken by voice, standing and walking without difficulty, no focal abnormality   Data Reviewed: personally reviewed   CBC    Component Value Date/Time   WBC 6.0 03/31/2022 0836   RBC 4.16 (L) 03/31/2022 0836   HGB 11.3 (L) 03/31/2022 0836   HCT 36.1 (L) 03/31/2022 0836   PLT 175 03/31/2022 0836   MCV 86.8 03/31/2022 0836   MCH 27.2 03/31/2022 0836   MCHC 31.3 03/31/2022 0836   RDW 13.3 03/31/2022 0836   LYMPHSABS 0.6 (L) 03/30/2022 0321   MONOABS 0.3 03/30/2022 0321   EOSABS 0.3 03/30/2022 0321   BASOSABS 0.0 03/30/2022 0321      Latest Ref Rng & Units 04/02/2022    4:09 AM 04/01/2022    4:16 AM 03/31/2022    8:36 AM  CMP  Glucose 70 - 99 mg/dL 106  126  241   BUN 6 - 20 mg/dL 13  17  22    Creatinine 0.61 - 1.24 mg/dL 0.92  0.97  1.02   Sodium 135 - 145 mmol/L 139  137  134   Potassium 3.5 - 5.1 mmol/L 3.9  3.7  3.8   Chloride  98 - 111 mmol/L 109  109  103   CO2 22 - 32 mmol/L 23  20  21    Calcium 8.9 - 10.3 mg/dL 8.5  8.4  8.7      Radiology Studies: CT ABDOMEN WO CONTRAST  Result Date: 04/01/2022 CLINICAL DATA:  Cholecystitis with a percutaneous cholecystostomy tube. EXAM: CT ABDOMEN WITHOUT CONTRAST TECHNIQUE: Multidetector CT imaging of the abdomen was performed following the standard protocol without IV contrast. RADIATION DOSE REDUCTION: This exam was performed according to the departmental dose-optimization program which includes automated exposure control, adjustment of the mA and/or kV according to patient size and/or use of iterative reconstruction technique. COMPARISON:  CT abdomen pelvis dated 03/29/2022 FINDINGS: Lower chest: There are mild-to-moderate bilateral ground-glass opacities/atelectasis, similar to prior exam. Hepatobiliary: A percutaneous cholecystostomy tube is noted within the gallbladder. The gallbladder is decompressed. No fluid collection is identified along the tract of the cholecystostomy tube. No focal liver abnormality is seen. No biliary dilatation. Pancreas: Unremarkable. No pancreatic ductal dilatation or surrounding inflammatory changes. Spleen: Normal  in size without focal abnormality. Adrenals/Urinary Tract: Adrenal glands are unremarkable. A left-sided nephroureteral stent is partially imaged. There is no hydronephrosis on either side. Bilateral nonobstructing renal calculi measure up to 3 mm. A right renal cyst measures 1.5 cm. No further imaging follow-up is recommended for this finding. Stomach/Bowel: Stomach is within normal limits. Appendix appears normal. No evidence of bowel wall thickening, distention, or inflammatory changes. Vascular/Lymphatic: Aortic atherosclerosis. No enlarged abdominal lymph nodes. Other: Surgical mesh is seen along the anterior abdominal wall, unchanged. Musculoskeletal: Degenerative changes are seen in the spine. IMPRESSION: 1. Cholecystostomy tube in place  with decompression of the gallbladder. No fluid collection is identified along the tract of the cholecystostomy tube. 2. Mild-to-moderate bilateral ground-glass opacities/atelectasis, similar to prior exam. 3. Left-sided nephroureteral stent is partially imaged. No hydronephrosis on either side. Aortic Atherosclerosis (ICD10-I70.0). Electronically Signed   By: Zerita Boers M.D.   On: 04/01/2022 20:42   MR BRAIN W WO CONTRAST  Result Date: 04/01/2022 CLINICAL DATA:  Delirium and possible demyelinating disease. EXAM: MRI HEAD WITHOUT AND WITH CONTRAST TECHNIQUE: Multiplanar, multiecho pulse sequences of the brain and surrounding structures were obtained without and with intravenous contrast. CONTRAST:  79mL GADAVIST GADOBUTROL 1 MMOL/ML IV SOLN COMPARISON:  10/03/2021 FINDINGS: Brain: Severe confluent white matter disease with multiple areas of vacuolation. The distribution of lesions is unchanged since 10/03/2021. There is no abnormal diffusion restriction. No acute or chronic hemorrhage. There is generalized volume loss. No hydrocephalus or extra-axial collection. No contrast-enhancing lesions. Vascular: Normal flow voids. Left cerebellar developmental venous anomaly. Skull and upper cervical spine: Normal marrow signal. Sinuses/Orbits: Negative. Other: None. IMPRESSION: 1. Unchanged distribution of severe confluent white matter disease with development of multifocal vacuolation. The distribution of lesions is compatible with a chronic demyelinating process. 2. No contrast-enhancing lesions to suggest active demyelination. Electronically Signed   By: Ulyses Jarred M.D.   On: 04/01/2022 19:57   IR EXCHANGE BILIARY DRAIN  Result Date: 04/01/2022 INDICATION: Partially retracted chronic cholecystostomy tube. Patient presents today for fluoroscopic guided exchange. (Cholecystostomy tube was initially placed on 09/08/2021) EXAM: FLUOROSCOPIC GUIDED CHOLECYSTOSTOMY TUBE EXCHANGE COMPARISON:  Fluoroscopic guided  cholecystostomy tube exchange-02/20/2022 MEDICATIONS: None ANESTHESIA/SEDATION: None CONTRAST:  37mL OMNIPAQUE IOHEXOL 300 MG/ML SOLN - administered into the gallbladder lumen. FLUOROSCOPY TIME:  [id number] mGy COMPLICATIONS: None immediate. PROCEDURE: The patient was positioned supine on the fluoroscopy table. The external portion of the existing cholecystostomy tube as well as the surrounding skin was prepped and draped in usual sterile fashion. A time-out was performed prior to the initiation of the procedure. A preprocedural spot fluoroscopic image was obtained of the right upper abdominal quadrant existing cholecystostomy tube. The skin surrounding the cholecystostomy tube was anesthetized with 1% lidocaine with epinephrine. The external portion of the cholecystostomy tube was cut and cannulated with a short Amplatz wire which was advanced through the tube and coiled within the gallbladder lumen Next, under intermittent fluoroscopic guidance, the existing 10 French cholecystostomy tube was exchanged for a new, 10 [place] cholecystostomy tube which was repositioned into the more central aspect of the gallbladder lumen. Contrast injection failed to opacify the cystic duct and as such, patient subsequently underwent noncontrast CT scan to ensure appropriate positioning. The cholecystostomy tube was flushed with a small amount of saline and reconnected to a gravity bag. The cholecystostomy tube was secured with an interrupted suture and a Stat Lock device. A dressing was applied. The patient tolerated the procedure well without immediate postprocedural complication. FINDINGS: Preprocedural spot fluoroscopic image  demonstrates unchanged positioning of cholecystostomy tube with end coiled and locked over the expected location of the fundus of the gallbladder. Contrast injection demonstrates patency of the cystic duct though there are two nonocclusive filling defects within the neck of the gallbladder and cystic duct  compatible with cholelithiasis/choledocholithiasis. There is a nonocclusive mobile filling defect within the CBD compatible with choledocholithiasis, similar to the 02/19/2022 examination. After fluoroscopic guided exchange, the new 5 French cholecystostomy tube is more ideally positioned with end coiled and locked within the central aspect of the gallbladder lumen however there is no further opacification of the cystic duct and as such patient will undergoing noncontrast CT scan to ensure appropriate positioning. IMPRESSION: 1. Technically successful fluoroscopic guided exchange and repositioning of 12 French cholecystostomy tube. 2. Similar findings of nonocclusive cholelithiasis and choledocholithiasis. PLAN: - Will obtain noncontrast CT scan to ensure appropriate positioning of the cholecystostomy tube. Electronically Signed   By: Sandi Mariscal M.D.   On: 04/01/2022 18:36     Scheduled Meds:  Chlorhexidine Gluconate Cloth  6 each Topical Q0600   fluocinonide cream   Topical BID   insulin aspart  0-9 Units Subcutaneous TID WC   insulin glargine-yfgn  10 Units Subcutaneous Daily   loratadine  10 mg Oral Daily   methocarbamol  500 mg Oral QHS   metoprolol tartrate  25 mg Oral BID   mupirocin ointment  1 Application Nasal BID   pantoprazole  40 mg Oral Daily   rosuvastatin  40 mg Oral Daily   traZODone  150 mg Oral QHS   Continuous Infusions:  sodium chloride 10 mL/hr at 03/30/22 0859    ceFAZolin (ANCEF) IV 2 g (04/02/22 0505)     LOS: 3 days   Erskine Emery, MD   04/02/2022, 12:24 PM

## 2022-04-02 NOTE — Progress Notes (Signed)
Orthostatic BP Laying: 106/62 MAP 76 Sitting 103/66 MAP 77 Standing 75/58 MAP 65

## 2022-04-02 NOTE — Progress Notes (Addendum)
Physical Therapy Treatment Patient Details Name: Troy Randall MRN: [id number] DOB: 08-31-1960 Today's Date: 04/02/2022   History of Present Illness Patient is a 62 year old male who presented on 3/25 with AMS and generalized rash.wife reported that rash started on 3/23.  CT stone study revealed "an obstructing 6 mm left UVJ stone with mild to moderate left-sided hydronephrosis along with bilateral nonobstructing renal stones" patient was admitted with UTI, obstructing stone, and AKI. Patient underwent Cystoscopy with left ureteroscopy, holmium laser lithotripsy and left ureteral stent placement on 3/25. PMH: arthritis, CVA,  HTN, kidney stones, ankle surgery,    PT Comments    Pt is progressing toward acute PT goals this session with progression of ambulation distance. Pt ambulated ~62ft x2 with MIN A, required seated rest break and limited with further progression due to dizziness today with low BP readings (see general gait details), improved at end of session. Pt will benefit from continued skilled PT to increase their independence and maximize safety with mobility.     Recommendations for follow up therapy are one component of a multi-disciplinary discharge planning process, led by the attending physician.  Recommendations may be updated based on patient status, additional functional criteria and insurance authorization.  Follow Up Recommendations       Assistance Recommended at Discharge Frequent or constant Supervision/Assistance  Patient can return home with the following A little help with walking and/or transfers;A little help with bathing/dressing/bathroom;Assistance with cooking/housework;Assist for transportation;Help with stairs or ramp for entrance   Equipment Recommendations  None recommended by PT    Recommendations for Other Services       Precautions / Restrictions Precautions Precautions: Fall Restrictions Weight Bearing Restrictions: No     Mobility  Bed  Mobility Overal bed mobility: Needs Assistance Bed Mobility: Supine to Sit     Supine to sit: Min assist     General bed mobility comments: extra time , assist with trunk, HHA pushing up on therapist hand to bring trunk to upright.    Transfers Overall transfer level: Needs assistance Equipment used: Rolling walker (2 wheels) Transfers: Sit to/from Stand Sit to Stand: Min assist, +2 safety/equipment           General transfer comment: x2; cues for reching back. Initially MIN A+2 for power up from EOB, progressed to MIN A +1 with use of armrests from recliner chair.    Ambulation/Gait Ambulation/Gait assistance: Min assist Gait Distance (Feet): 45 Feet Assistive device: Rolling walker (2 wheels) Gait Pattern/deviations: Step-to pattern, Decreased step length - left, Narrow base of support Gait velocity: decr     General Gait Details: 8ft x2 with seated rest break between. intermitent drifting to R, use of therapist pusing IV pole for visual target to maintain straight path. narrow BOS. Vitals start of session 110/78, 69bpm. Sitting EOB with complaints of dizziness 93/71, 79bpm. Standing marches performed, pt agreeable to ambulation. Following initial 50ft BP 91/67, 78bpm. Upon return to room seated in recliner BP reading  86/68 MAP (75). following seated rest in recliner at end of session, 107/75 MAP (86), 72bpm in recliner with LEs elevatved   Stairs             Wheelchair Mobility    Modified Rankin (Stroke Patients Only)       Balance Overall balance assessment: Needs assistance Sitting-balance support: Feet supported, Bilateral upper extremity supported Sitting balance-Leahy Scale: Fair     Standing balance support: During functional activity, Bilateral upper extremity supported, Reliant on assistive  device for balance Standing balance-Leahy Scale: Poor                              Cognition Arousal/Alertness: Lethargic (improved throughout  session) Behavior During Therapy: Flat affect, Impulsive Overall Cognitive Status: History of cognitive impairments - at baseline                                 General Comments: follows simple commands, using head nods and 1 word replies this session, wife present        Exercises      General Comments        Pertinent Vitals/Pain Pain Assessment Pain Assessment: No/denies pain    Home Living                          Prior Function            PT Goals (current goals can now be found in the care plan section) Acute Rehab PT Goals Patient Stated Goal: to go back  to Rehab-wife PT Goal Formulation: With patient/family Time For Goal Achievement: 04/14/22 Potential to Achieve Goals: Good Progress towards PT goals: Progressing toward goals    Frequency    Min 3X/week      PT Plan Current plan remains appropriate    Co-evaluation              AM-PAC PT "6 Clicks" Mobility   Outcome Measure  Help needed turning from your back to your side while in a flat bed without using bedrails?: A Little Help needed moving from lying on your back to sitting on the side of a flat bed without using bedrails?: A Little Help needed moving to and from a bed to a chair (including a wheelchair)?: A Little Help needed standing up from a chair using your arms (e.g., wheelchair or bedside chair)?: A Lot Help needed to walk in hospital room?: A Little Help needed climbing 3-5 steps with a railing? : A Lot 6 Click Score: 16    End of Session Equipment Utilized During Treatment: Gait belt Activity Tolerance: Patient tolerated treatment well Patient left: in chair;with family/visitor present Nurse Communication: Mobility status (BP readings during session) PT Visit Diagnosis: Unsteadiness on feet (R26.81);Difficulty in walking, not elsewhere classified (R26.2);Other symptoms and signs involving the nervous system (R29.898)     Time: 1000-1036 PT Time  Calculation (min) (ACUTE ONLY): 36 min  Charges:  $Therapeutic Activity: 23-37 mins                    Festus Barren PT, DPT  Acute Rehabilitation Services  Office [phone]  04/02/2022, 11:09 AM

## 2022-04-02 NOTE — Progress Notes (Signed)
Speech Language Pathology Dysphagia Treatment Patient Details Name: Troy Randall MRN: [id number] DOB: [birth year] Today's Date: 04/02/2022 Time: [id number] SLP Time Calculation (min) (ACUTE ONLY): 10 min  Assessment / Plan / Recommendation Clinical Impression     Pt seen with his wife present. He allowed SLP and his wife to slide him up in bed.  Voice is dysphonic, hoarse and he demonstrates weak cued cough, which wife reports voice/cough are intact PTA.  She states he was sleepy post-procedure yesterday and waking more this afternoon.  Suspect above factors may be contributing to minimal discomfort.  Pt consumed a single sip of water only - requiring SLP to help administer to him via cup after he repeatedly blew into straw.  Swallow was timely without indication of oropharyngeal dysphagia nor airway compromise.  He declined to consume any more intake including water (wished to trial 3 ounce [place]) nor solids despite wife's encouragement.   He was very fidgety, moving his legs toward the end of the bed and needed to have [place] reclined for safety/comfort.  Pt's prior CVA causing cognitive-linguistic deficits combined with encephalopathy are likely impacting his participation. As pt with good tolerance of po per wife, (he consumed approx 75% of today's lunch meal per wife), having no recurrent respiratory infections, etc - recommended to pt and wife for him to continue regular/thin diet with general aspiration/reflux precautions. Encouraged him to stay partially upright after meals given his premorbid PPI requirement for GI bleed.        Diet Recommendation    Regular/thin as tolerated Medications one  at a time    SLP Plan All goals met   Pertinent Vitals/Pain Afebrile, RA   Swallowing Goals     General HPI: Patient is a 62 y.o. male with PMH: DM-2, HTN, HLD, SVT, MS, refractory UTI, GI bleed 09/2021, cholecstitis s/p perc chole drain, acute disseminated encephalomyelitis, CVA in April 2023,  prolonged hospitalization 8/31-11/22/23 following GI bleed requring intubation, trach and PEG placement. which included AIR. He presented to [facility] ED on 03/29/22 with sudden onset rash which covered his entire body. In addition, spouse noticed leaking around his cholecystostomy tube. In ED, he was afebrile, had diffuse papular rash on both arms, legs, torso and face. CXR negative for acute findings, UA indicative of UTI, CT abd/pelvis showed b/l nephrolithiasis; left UVJ 6 mm stone with obstruction; right kidney cyst; RUQ PERC drain in place with no fluid collection. Urology consulted and recommended stenting in AM next date. Patient was transferred from [facility] to [facility]. On 03/30/22, patient underwent cystoscopy with left ureteroscopy, holmium laser lithotripsy and left ureteral stent placement. SLP ordered to evaluate swallow function and was completed on Monday, April 24th. Follow up re: pt's swallow function indicated.  Oral Cavity - Oral Hygiene   N/A  provided basin and recommended dental brushing BID  Dysphagia Treatment Family/Caregiver Educated: wife, Levada Dy Treatment Methods: Skilled observation;Compensation strategy training;Patient/caregiver education Patient observed directly with PO's: Yes Type of PO's observed: Thin liquids (pt declined to consume any solids intake and needed SLP to provide him with po via cup after he repeatedly blew through straw) Feeding: Fed self Liquids provided via: Cup;Straw (pt did not form suction on straw) Type of cueing: Verbal;Visual Amount of cueing: Maximal    [name] 04/02/2022, 5:02 PM Kathleen Lime, MS [place] Office [phone]

## 2022-04-02 NOTE — Care Management Important Message (Signed)
Important Message  Patient Details IM Letter given. Name: Troy Randall MRN: [id number] Date of Birth: 1960/05/06   Medicare Important Message Given:  Yes     Kerin Salen 04/02/2022, 11:37 AM

## 2022-04-03 ENCOUNTER — Other Ambulatory Visit (HOSPITAL_COMMUNITY): Payer: Self-pay

## 2022-04-03 DIAGNOSIS — L270 Generalized skin eruption due to drugs and medicaments taken internally: Secondary | ICD-10-CM | POA: Diagnosis not present

## 2022-04-03 LAB — RENAL FUNCTION PANEL
Albumin: 2.9 g/dL — ABNORMAL LOW (ref 3.5–5.0)
Anion gap: 8 (ref 5–15)
BUN: 11 mg/dL (ref 6–20)
CO2: 20 mmol/L — ABNORMAL LOW (ref 22–32)
Calcium: 8.1 mg/dL — ABNORMAL LOW (ref 8.9–10.3)
Chloride: 106 mmol/L (ref 98–111)
Creatinine, Ser: 0.67 mg/dL (ref 0.61–1.24)
GFR, Estimated: >60 mL/min (ref >60)
Glucose, Bld: 116 mg/dL — ABNORMAL HIGH (ref 70–99)
Phosphorus: 3.2 mg/dL (ref 2.5–4.6)
Potassium: 3.1 mmol/L — ABNORMAL LOW (ref 3.5–5.1)
Sodium: 134 mmol/L — ABNORMAL LOW (ref 135–145)

## 2022-04-03 LAB — CULTURE, BLOOD (ROUTINE X 2)
Culture: NO GROWTH
Special Requests: ADEQUATE

## 2022-04-03 LAB — GLUCOSE, CAPILLARY: Glucose-Capillary: 120 mg/dL — ABNORMAL HIGH (ref 70–99)

## 2022-04-03 MED ORDER — METOPROLOL SUCCINATE ER 25 MG PO TB24
12.5000 mg | ORAL_TABLET | Freq: Every day | ORAL | 1 refills | Status: AC
  Filled 2022-04-03: qty 30, 60d supply, fill #0

## 2022-04-03 MED ORDER — CEFADROXIL 1 G PO TABS
1.0000 g | ORAL_TABLET | Freq: Three times a day (TID) | ORAL | 0 refills | Status: DC
  Filled 2022-04-03: qty 6, 2d supply, fill #0

## 2022-04-03 MED ORDER — CEFADROXIL 500 MG PO CAPS
1.0000 g | ORAL_CAPSULE | Freq: Two times a day (BID) | ORAL | 0 refills | Status: AC
  Filled 2022-04-03: qty 8, 2d supply, fill #0

## 2022-04-03 MED ORDER — POTASSIUM CHLORIDE CRYS ER 20 MEQ PO TBCR
40.0000 meq | EXTENDED_RELEASE_TABLET | Freq: Two times a day (BID) | ORAL | 0 refills | Status: DC
  Filled 2022-04-03: qty 10, 3d supply, fill #0

## 2022-04-03 MED ORDER — TYLENOL EXTRA STRENGTH 500 MG PO TABS
500.0000 mg | ORAL_TABLET | Freq: Four times a day (QID) | ORAL | 0 refills | Status: AC | PRN
  Filled 2022-04-03: qty 30, 8d supply, fill #0

## 2022-04-03 NOTE — Discharge Summary (Signed)
Physician Discharge Summary  MAJESTIC SPESSARD [id number] DOB: 03-Jun-1960 DOA: 03/29/2022  PCP: [name], MD  Admit date: 03/29/2022 Discharge date: 04/03/2022  Time spent: 37 minutes  Recommendations for Outpatient Follow-up:  Needs Chem-12 CBC 1 week Recommend cautious reinitiation of Kerendia--do not think this would be a good idea and patient with postural hypotension although may balance this with the benefit of renal protection in the setting of diabetes mellitus New meds this admission-cefadroxil, potassium Med changes-Toprol-XL low-dose 12.5 CC urologist Dr. Lovena Neighbours on discharge summary as needs outpatient follow-up for removal of double-J stent/management of chronic indwelling Foley CC IR Dr. Pascal Lux to ensure cholecystotomy is followed up for interval changes CC neurology Dr. 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 followed by neurologist Cholecystotomy management DM TY 2 Hypotension Hyponatremia, hypokalemia  Please see below for itemized issues addressed in HOpsital- refer to other progress notes for clarity if needed  Discharge Condition: Improved and at baseline  Diet recommendation: Diabetic heart healthy  Filed Weights   04/01/22 0500 04/02/22 0511 04/03/22 0500  Weight: 97.1 kg 98.4 kg 98.4 kg    History of present illness:  62 year old white male DM TY 2 HTN HLD  Lengthy hospitalization 8/31 through 10/30/2021-at that hospitalization underwent 20 units PRBC transfusion sigmoidoscopy and had toxic metabolic encephalopathy consistent with active demyelination --received 2 plasma exchange X5 doses at that hospitalization and now currently follows with Dr. Rachel Moulds of  neurology in the outpatient setting--- at that visit he also had E. coli cholecystitis and a PERC drain placed on 09/07/2021 and IR continues to follow CVA X2 --apparent full recovery from both latest was 04/2021 on aspirin Plavix previously  Recent inpatient hospitalization again 11/8 through 11/22 with fevers and was transferred to [facility] and admitted with SVT and sepsis workup and grew E. coli and was treated with IV cephalosporins and indwelling Foley catheter was maintained at that time   Present [facility] ED 3/24 with rash and confusion and was recently Rx Bactrim-it was not felt to be Stevens-Johnson patient was found to have mild AKI  CT abdomen pelvis = 6 mm right UVJ stone Initial Rx Benadryl  3/25 transfer [facility] 2/2 need for urological procedures secondary to moderate left-sided hydronephrosis along with bilateral nonobstructing renal stones--underwent cystoscopy with left ureteroscopy and left ureteric double-J stent placement to 16 French Foley catheter tethering 3/26 transferred to [facility] hospitalist  Hospital Course:  Sepsis secondary to gram-negative UTI from probable UVJ stone - Prior hospitalist discussed with ID and transitioning to cefazolin IV--on discharge on cefadroxil 1 g twice daily to complete 7-day course of called in antibiotics to the [facility] - Urology plans follow-up in 1 week-for consideration of removal of stone/double-J stent and Foley catheter management-I have CC Dr. Lovena Neighbours on this summer - Blood culture X2   [-] since 3/24 and no further workup is required   Acute kidney injury present on admission - BUNs/creatinine 25/2.4--> trended to baseline of 11/0.6 on discharge - Still mildly acidotic monitor trends and will need labs in about a week   Drug rash initial concern Katherina Right syndrome - Possible chest drug exanthem and Benadryl and famotidine stopped - Continue Lidex twice daily, loratadine 10 daily if needed although this  is much improved   Acute disseminated encephalomyelitis? -Follows with neurology Dr. Nash Mantis had extensive workup it is believed with CSF studies and MRI - MRI brain performed 3/27 = unchanged distribution of severe confluent white matter disease with multifocal vacuolation and this is compatible with chronic demyelinating process with no contrast-enhancing lesions to suggest active demyelination - He is now at his stable baseline on my review at the bedside this morning and has been able to help with transfers and his wife confirms this is his baseline - Continues on Robaxin 500 at bedtime, trazodone 150 at bedtime   Prior cholecystitis and with cholecystotomy in place since 09/2021 - IR drain replacement 3/27 performed without event - Will require further IR follow-up CC IR physician on discharge to ensure this is done   Chronic indwelling Foley - Foley replaced at admission - Outpatient urology follow-up for above issues   Hypotension occurring during hospital stay HTN + prior CV disease -We adjusted his Toprol 25 twice daily downward to Toprol-XL 12.5 daily and he will need further outpatient management as per PCP with regards to further management -I would hesitate to use Carrington Clamp as this has a 5% chance of hypotension, -Would continue Crestor 40 daily   DM TY 2 A1c 6.6 here - CBGs well-controlled during hospital stay - Continue Lantus 10 units--holding Kerendia - Eating 85 to 100% of meals continue sliding scale   Hyponatremia-resolved - Resolved   Mild hypokalemia on discharge - K-Dur 40 twice daily for 5 days and repeat labs in the outpatient setting   Discharge Exam: Vitals:   04/02/22 1927 04/03/22 0550  BP: 133/87 116/79  Pulse: 91 72  Resp: 18 18  Temp: 98.9 F (37.2 C) 98.2 F (36.8 C)  SpO2: 100% 99%    Subj on day of d/c   Walked in the hallways blood pressures remained stable he is eating breakfast he looks comfortable his wife says he is at his  baseline  General Exam on discharge  EOMI NCAT no focal deficit looks well S1-S2 no murmur no rub no gallop CTAB ROM intact Abdomen soft no rebound Cholecystotomy in place Foley in place Old tracheotomy scar noted in neck  Discharge Instructions   Discharge Instructions     Call MD for:  difficulty breathing, headache or visual disturbances   Complete by: As directed    Call MD for:  persistant nausea and vomiting   Complete by: As directed    Call MD for:  redness, tenderness, or signs of infection (pain, swelling, redness, odor or green/yellow discharge around incision site)   Complete by: As directed    Call MD for:  severe uncontrolled pain   Complete by: As directed    Call MD for:  temperature >100.4   Complete by: As directed    Diet - low sodium heart healthy   Complete by: As directed    Discharge instructions   Complete by: As directed    Dear Larrie Kass,   Thank you so much for allowing [place] to be part of your care!  You were admitted for kidney stones, mental status changes.   We were able to have your cholecystomy tube changed with IR, you will need to follow with them   Additionally, urology placed a stent and got rid of the ureteral stone. You will need to follow with urology as well.   Follow with Dr. Felecia Shelling as well for neurology work up further.   We will decrease the  metoprolol for low BP   POST-HOSPITAL & CARE INSTRUCTIONS  Please let PCP/Specialists know of any changes that were made.  Please see medications section of this packet for any medication changes.   DOCTOR'S APPOINTMENT & FOLLOW UP CARE INSTRUCTIONS  1. Follow up with urology  2. Follow up with IR  3. Follow up with PCP and neurology   Take care and be well!   If the dressing is still on your incision site when you go home, remove it on the third day after your surgery date. Remove dressing if it begins to fall off, or if it is dirty or damaged before the third day.   Complete  by: As directed    Increase activity slowly   Complete by: As directed    No wound care   Complete by: As directed       Allergies as of 04/03/2022       Reactions   Bactrim [sulfamethoxazole-trimethoprim] Rash   Amoxicillin Hives   Tolerated Cefepime        Medication List     STOP taking these medications    Kerendia 20 MG Tabs Generic drug: Finerenone   metoprolol tartrate 25 MG tablet Commonly known as: LOPRESSOR       TAKE these medications    BD Veo Insulin Syringe U/F 31G X 15/64" 1 ML Misc Generic drug: Insulin Syringe-Needle U-100 USE AS DIRECTED   cefadroxil 1 g tablet Commonly known as: DURICEF Take 1 tablet (1 g total) by mouth in the morning, at noon, and at bedtime for 2 days.   glucose blood test strip Commonly known as: OneTouch Verio TEST twice a day   Insulin Pen Needle 32G X 5 MM Misc Commonly known as: NovoTwist Use two daily to inject Victoza and Toujeo.   methocarbamol 500 MG tablet Commonly known as: ROBAXIN Take 1 tablet (500 mg total) by mouth every 6 (six) hours as needed for muscle spasms.   metoprolol succinate 25 MG 24 hr tablet Commonly known as: TOPROL-XL Take 0.5 tablets (12.5 mg total) by mouth daily.   OneTouch Delica Lancets 99991111 Misc Use to check blood sugar once a day dx code E11.65   pantoprazole 40 MG tablet Commonly known as: PROTONIX Take 1 tablet (40 mg total) by mouth daily. What changed: when to take this   potassium chloride SA 20 MEQ tablet Commonly known as: KLOR-CON M Take 2 tablets (40 mEq total) by mouth 2 (two) times daily.   PROBIOTIC PO Take 8.5 mg by mouth daily.   rosuvastatin 40 MG tablet Commonly known as: Crestor Take 1 tablet (40 mg total) by mouth daily.   traZODone 50 MG tablet Commonly known as: DESYREL Take 0.5-1 tablets (25-50 mg total) by mouth at bedtime as needed for sleep. What changed:  how much to take when to take this   Tresiba FlexTouch 200 UNIT/ML FlexTouch  Pen Generic drug: insulin degludec Inject under skin 15 units twice a day. What changed:  how much to take how to take this when to take this additional instructions   TYLENOL 500 MG tablet Generic drug: acetaminophen Take 1 tablet (500 mg total) by mouth every 6 (six) hours as needed for mild pain or headache. What changed: how much to take               Discharge Care Instructions  (From admission, onward)           Start     Ordered  04/03/22 0000  If the dressing is still on your incision site when you go home, remove it on the third day after your surgery date. Remove dressing if it begins to fall off, or if it is dirty or damaged before the third day.        04/03/22 0827           Allergies  Allergen Reactions   Bactrim [Sulfamethoxazole-Trimethoprim] Rash   Amoxicillin Hives    Tolerated Cefepime    Follow-up Information     Franchot Gallo, MD Follow up on 04/21/2022.   Specialty: Urology Contact information: [address] [place] [address] [place], [place] Follow up.   Specialty: Home Health Services Why: HHC/Physical Therapy Contact information: [address] [place] [address] [phone]         Ceasar Mons, MD. Call in 1 week(s).   Specialty: Urology Why: ureteral stent in 1 week and exchange Foley catheter. Contact information: [place] [address] [phone]         Britt Bottom, MD. Go to.   Specialty: Neurology Contact information: [place] [address] [phone]                  The results of significant diagnostics from this hospitalization (including imaging, microbiology, ancillary and laboratory) are listed below for reference.    Significant Diagnostic Studies: CT ABDOMEN WO CONTRAST  Result Date: 04/01/2022 CLINICAL DATA:  Cholecystitis with a percutaneous  cholecystostomy tube. EXAM: CT ABDOMEN WITHOUT CONTRAST TECHNIQUE: Multidetector CT imaging of the abdomen was performed following the standard protocol without IV contrast. RADIATION DOSE REDUCTION: This exam was performed according to the departmental dose-optimization program which includes automated exposure control, adjustment of the mA and/or kV according to patient size and/or use of iterative reconstruction technique. COMPARISON:  CT abdomen pelvis dated 03/29/2022 FINDINGS: Lower chest: There are mild-to-moderate bilateral ground-glass opacities/atelectasis, similar to prior exam. Hepatobiliary: A percutaneous cholecystostomy tube is noted within the gallbladder. The gallbladder is decompressed. No fluid collection is identified along the tract of the cholecystostomy tube. No focal liver abnormality is seen. No biliary dilatation. Pancreas: Unremarkable. No pancreatic ductal dilatation or surrounding inflammatory changes. Spleen: Normal in size without focal abnormality. Adrenals/Urinary Tract: Adrenal glands are unremarkable. A left-sided nephroureteral stent is partially imaged. There is no hydronephrosis on either side. Bilateral nonobstructing renal calculi measure up to 3 mm. A right renal cyst measures 1.5 cm. No further imaging follow-up is recommended for this finding. Stomach/Bowel: Stomach is within normal limits. Appendix appears normal. No evidence of bowel wall thickening, distention, or inflammatory changes. Vascular/Lymphatic: Aortic atherosclerosis. No enlarged abdominal lymph nodes. Other: Surgical mesh is seen along the anterior abdominal wall, unchanged. Musculoskeletal: Degenerative changes are seen in the spine. IMPRESSION: 1. Cholecystostomy tube in place with decompression of the gallbladder. No fluid collection is identified along the tract of the cholecystostomy tube. 2. Mild-to-moderate bilateral ground-glass opacities/atelectasis, similar to prior exam. 3. Left-sided  nephroureteral stent is partially imaged. No hydronephrosis on either side. Aortic Atherosclerosis (ICD10-I70.0). Electronically Signed   By: Zerita Boers M.D.   On: 04/01/2022 20:42   MR BRAIN W WO CONTRAST  Result Date: 04/01/2022 CLINICAL DATA:  Delirium and possible demyelinating disease. EXAM: MRI HEAD WITHOUT AND WITH CONTRAST TECHNIQUE: Multiplanar, multiecho pulse sequences of the brain and surrounding structures were obtained without and with intravenous  contrast. CONTRAST:  54mL GADAVIST GADOBUTROL 1 MMOL/ML IV SOLN COMPARISON:  10/03/2021 FINDINGS: Brain: Severe confluent white matter disease with multiple areas of vacuolation. The distribution of lesions is unchanged since 10/03/2021. There is no abnormal diffusion restriction. No acute or chronic hemorrhage. There is generalized volume loss. No hydrocephalus or extra-axial collection. No contrast-enhancing lesions. Vascular: Normal flow voids. Left cerebellar developmental venous anomaly. Skull and upper cervical spine: Normal marrow signal. Sinuses/Orbits: Negative. Other: None. IMPRESSION: 1. Unchanged distribution of severe confluent white matter disease with development of multifocal vacuolation. The distribution of lesions is compatible with a chronic demyelinating process. 2. No contrast-enhancing lesions to suggest active demyelination. Electronically Signed   By: Ulyses Jarred M.D.   On: 04/01/2022 19:57   IR EXCHANGE BILIARY DRAIN  Result Date: 04/01/2022 INDICATION: Partially retracted chronic cholecystostomy tube. Patient presents today for fluoroscopic guided exchange. (Cholecystostomy tube was initially placed on 09/08/2021) EXAM: FLUOROSCOPIC GUIDED CHOLECYSTOSTOMY TUBE EXCHANGE COMPARISON:  Fluoroscopic guided cholecystostomy tube exchange-02/20/2022 MEDICATIONS: None ANESTHESIA/SEDATION: None CONTRAST:  7mL OMNIPAQUE IOHEXOL 300 MG/ML SOLN - administered into the gallbladder lumen. FLUOROSCOPY TIME:  [id number] mGy COMPLICATIONS: None  immediate. PROCEDURE: The patient was positioned supine on the fluoroscopy table. The external portion of the existing cholecystostomy tube as well as the surrounding skin was prepped and draped in usual sterile fashion. A time-out was performed prior to the initiation of the procedure. A preprocedural spot fluoroscopic image was obtained of the right upper abdominal quadrant existing cholecystostomy tube. The skin surrounding the cholecystostomy tube was anesthetized with 1% lidocaine with epinephrine. The external portion of the cholecystostomy tube was cut and cannulated with a short Amplatz wire which was advanced through the tube and coiled within the gallbladder lumen Next, under intermittent fluoroscopic guidance, the existing 10 French cholecystostomy tube was exchanged for a new, 10 [place] cholecystostomy tube which was repositioned into the more central aspect of the gallbladder lumen. Contrast injection failed to opacify the cystic duct and as such, patient subsequently underwent noncontrast CT scan to ensure appropriate positioning. The cholecystostomy tube was flushed with a small amount of saline and reconnected to a gravity bag. The cholecystostomy tube was secured with an interrupted suture and a Stat Lock device. A dressing was applied. The patient tolerated the procedure well without immediate postprocedural complication. FINDINGS: Preprocedural spot fluoroscopic image demonstrates unchanged positioning of cholecystostomy tube with end coiled and locked over the expected location of the fundus of the gallbladder. Contrast injection demonstrates patency of the cystic duct though there are two nonocclusive filling defects within the neck of the gallbladder and cystic duct compatible with cholelithiasis/choledocholithiasis. There is a nonocclusive mobile filling defect within the CBD compatible with choledocholithiasis, similar to the 02/19/2022 examination. After fluoroscopic guided exchange, the new  88 French cholecystostomy tube is more ideally positioned with end coiled and locked within the central aspect of the gallbladder lumen however there is no further opacification of the cystic duct and as such patient will undergoing noncontrast CT scan to ensure appropriate positioning. IMPRESSION: 1. Technically successful fluoroscopic guided exchange and repositioning of 12 French cholecystostomy tube. 2. Similar findings of nonocclusive cholelithiasis and choledocholithiasis. PLAN: - Will obtain noncontrast CT scan to ensure appropriate positioning of the cholecystostomy tube. Electronically Signed   By: Sandi Mariscal M.D.   On: 04/01/2022 18:36   DG C-Arm 1-60 Min-No Report  Result Date: 03/30/2022 Fluoroscopy was utilized by the requesting physician.  No radiographic interpretation.   CT ABDOMEN PELVIS WO CONTRAST  Result Date: 03/29/2022  CLINICAL DATA:  Pain EXAM: CT ABDOMEN AND PELVIS WITHOUT CONTRAST TECHNIQUE: Multidetector CT imaging of the abdomen and pelvis was performed following the standard protocol without IV contrast. RADIATION DOSE REDUCTION: This exam was performed according to the departmental dose-optimization program which includes automated exposure control, adjustment of the mA and/or kV according to patient size and/or use of iterative reconstruction technique. COMPARISON:  11/08/2021 FINDINGS: Lower chest: Ground-glass opacity consistent with pneumonitis or edema. No pericardial or pleural effusions. Atheromatous calcification coronary arteries. Hepatobiliary: Percutaneous drainage catheter right upper quadrant. Gallbladder not identified. No biliary ductal dilatation. Pancreas: Unremarkable. No pancreatic ductal dilatation or surrounding inflammatory changes. Adrenals/Urinary Tract: Left-sided hydronephrosis and hydroureter with a stone at the left UVJ measuring 6 mm. A few stones noted in the urinary bladder. There is a Foley catheter. Right-sided intrarenal 2 mm stones identified.  Right kidney lesion consistent with a 2 cm cyst. Stomach/Bowel: Stomach is within normal limits. Appendix appears normal. No evidence of bowel wall thickening, distention, or inflammatory changes. Vascular/Lymphatic: No significant vascular findings are present. No enlarged abdominal or pelvic lymph nodes. Reproductive: Prostate is unremarkable. Other: Postop changes anterior abdominal wall repair with a mesh. Small periumbilical abdominal wall defect containing omental fat. Musculoskeletal: No acute or significant osseous findings. IMPRESSION: 1. Bilateral nephrolithiasis. 2. Left UVJ 6 mm stone with obstruction. 3. Right kidney cyst. 4. Right upper quadrant percutaneous drain. No fluid collections identified. 5. Anterior abdominal postop changes status post hernia repair with a small residual abdominal wall defect. Electronically Signed   By: Sammie Bench M.D.   On: 03/29/2022 18:56   DG Chest Port 1 View  Result Date: 03/29/2022 CLINICAL DATA:  Sepsis. EXAM: PORTABLE CHEST 1 VIEW COMPARISON:  November 06, 2021. FINDINGS: The heart size and mediastinal contours are within normal limits. Both lungs are clear. The visualized skeletal structures are unremarkable. IMPRESSION: No active disease. Electronically Signed   By: Marijo Conception M.D.   On: 03/29/2022 16:29    Microbiology: Recent Results (from the past 240 hour(s))  Remove and replace urinary cath (placed > 5 days) then obtain urine culture from new indwelling urinary catheter.     Status: Abnormal   Collection Time: 03/29/22  3:32 PM   Specimen: Urine, Catheterized  Result Value Ref Range Status   Specimen Description URINE, CATHETERIZED  Final   Special Requests   Final    NONE Performed at [facility], [address] [place], [place] [address]    Culture MULTIPLE SPECIES PRESENT, SUGGEST RECOLLECTION (A)  Final   Report Status 03/31/2022 FINAL  Final  Blood Culture (routine x 2)     Status: None   Collection Time: 03/29/22   3:36 PM   Specimen: BLOOD  Result Value Ref Range Status   Specimen Description BLOOD SITE NOT SPECIFIED  Final   Special Requests   Final    BOTTLES DRAWN AEROBIC AND ANAEROBIC Blood Culture adequate volume   Culture   Final    NO GROWTH 5 DAYS Performed at [facility], [place] [address], [place], [place] [address]    Report Status 04/03/2022 FINAL  Final  MRSA Next Gen by PCR, Nasal     Status: Abnormal   Collection Time: 03/29/22 10:39 PM   Specimen: Nasal Mucosa; Nasal Swab  Result Value Ref Range Status   MRSA by PCR Next Gen DETECTED (A) NOT DETECTED Final    Comment: RESULT CALLED TO, READ BACK BY AND VERIFIED WITH: R. PROCTER RN  03/30/22 @ 0128 BY AB (NOTE) The GeneXpert MRSA Assay (FDA approved for NASAL specimens only), is one component of a comprehensive MRSA colonization surveillance program. It is not intended to diagnose MRSA infection nor to guide or monitor treatment for MRSA infections. Test performance is not FDA approved in patients less than 64 years old. Performed at [facility], [place] [address], [place], [place] [address]   Blood Culture (routine x 2)     Status: None (Preliminary result)   Collection Time: 03/30/22  3:21 AM   Specimen: BLOOD RIGHT HAND  Result Value Ref Range Status   Specimen Description BLOOD RIGHT HAND  Final   Special Requests   Final    BOTTLES DRAWN AEROBIC AND ANAEROBIC Blood Culture adequate volume   Culture   Final    NO GROWTH 4 DAYS Performed at [facility], [place] [address], [place], [place] [address]    Report Status PENDING  Incomplete     Labs: Basic Metabolic Panel: Recent Labs  Lab 03/30/22 0321 03/31/22 0836 04/01/22 0416 04/02/22 0409 04/03/22 0339  NA 131* 134* 137 139 134*  K 4.3 3.8 3.7 3.9 3.1*  CL 99 103 109 109 106  CO2 19* 21* 20* 23 20*  GLUCOSE 119* 241* 126* 106* 116*  BUN 22* 22* 17 13 11   CREATININE 1.74* 1.02 0.97 0.92 0.67  CALCIUM 8.6* 8.7* 8.4* 8.5* 8.1*  MG 1.7  --   --    --   --   PHOS 4.3  --   --  3.4 3.2   Liver Function Tests: Recent Labs  Lab 03/29/22 1618 03/30/22 0321 04/02/22 0409 04/03/22 0339  AST 41 35  --   --   ALT 35 33  --   --   ALKPHOS 63 58  --   --   BILITOT 0.9 1.0  --   --   PROT 7.4 6.1*  --   --   ALBUMIN 3.4* 2.8* 3.0* 2.9*   No results for input(s): "LIPASE", "AMYLASE" in the last 168 hours. No results for input(s): "AMMONIA" in the last 168 hours. CBC: Recent Labs  Lab 03/29/22 1618 03/29/22 2355 03/30/22 0321 03/31/22 0836  WBC 7.5 6.1 7.8 6.0  NEUTROABS 5.5  --  6.6  --   HGB 13.5 13.4 12.0* 11.3*  HCT 41.1 41.5 37.0* 36.1*  MCV 85.4 85.9 85.5 86.8  PLT 202 181 173 175   Cardiac Enzymes: No results for input(s): "CKTOTAL", "CKMB", "CKMBINDEX", "TROPONINI" in the last 168 hours. BNP: BNP (last 3 results) No results for input(s): "BNP" in the last 8760 hours.  ProBNP (last 3 results) No results for input(s): "PROBNP" in the last 8760 hours.  CBG: Recent Labs  Lab 04/02/22 0733 04/02/22 1157 04/02/22 1708 04/02/22 2109 04/03/22 0809  GLUCAP 105* 132* 117* 146* 120*       Signed:  Nita Sells MD   [facility] 04/03/2022, 8:27 AM

## 2022-04-03 NOTE — TOC Transition Note (Signed)
Transition of Care [facility]) - CM/SW Discharge Note   Patient Details  Name: Troy Randall MRN: [id number] Date of Birth: 10/09/1960  Transition of Care [facility]) CM/SW Contact:  Lennart Pall, LCSW Phone Number: 04/03/2022, 12:25 PM   Clinical Narrative:     Pt medically cleared for dc home today.  Much improved with ambulation and pt/ wife agree to transport home via private car.  HHPT/RN arranged with [facility].  No further TOC needs.  Final next level of care: Home w Home Health Services Barriers to Discharge: No Barriers Identified   Patient Goals and CMS Choice CMS Medicare.gov Compare Post Acute Care list provided to:: Other (Comment Required) (wife) Choice offered to / list presented to : Spouse  Discharge Placement                         Discharge Plan and Services Additional resources added to the After Visit Summary for                  DME Arranged: N/A DME Agency: NA       HH Arranged: RN, PT HH Agency: [place] Date [facility] Agency Contacted: 03/31/22 Time [place]: 1202 Representative spoke with at [place]: [place] Determinants of Health ([place]) Interventions SDOH Screenings   Food Insecurity: No Food Insecurity (03/31/2022)  Housing: Low Risk  (03/31/2022)  Transportation Needs: No Transportation Needs (03/31/2022)  Utilities: Not At Risk (03/31/2022)  Depression (PHQ2-9): High Risk (01/08/2022)  Tobacco Use: Medium Risk (04/01/2022)     Readmission Risk Interventions    04/03/2022   12:24 PM  Readmission Risk Prevention Plan  Transportation Screening Complete  PCP or Specialist Appt within 5-7 Days Complete  Medication Review (RN CM) Complete

## 2022-04-03 NOTE — Progress Notes (Signed)
Physical Therapy Treatment Patient Details Name: Troy Randall MRN: [id number] DOB: 05-Dec-1960 Today's Date: 04/03/2022   History of Present Illness Patient is a 62 year old male who presented on 3/25 with AMS and generalized rash.wife reported that rash started on 3/23.  CT stone study revealed "an obstructing 6 mm left UVJ stone with mild to moderate left-sided hydronephrosis along with bilateral nonobstructing renal stones" patient was admitted with UTI, obstructing stone, and AKI. Patient underwent Cystoscopy with left ureteroscopy, holmium laser lithotripsy and left ureteral stent placement on 3/25. PMH: arthritis, CVA,  HTN, kidney stones, ankle surgery,    PT Comments     Pt admitted with above diagnosis.  Pt currently with functional limitations due to the deficits listed below (see PT Problem List). Pt in bed when PT arrived, wife present and reported anticipated d/c today transport via [place] [facility] services. Pt agreeable to therapy intervention with pt requiring min A for supine to sit, mod A for sit to supine with wife assisting with B LE at baseline, min guard for STS from EOB and SPT at RW recliner to bed. Pt demonstrated improved gait tolerance and I with 115 feet with RW and min guard, B LE passing in stance phase, good foot clearance and narrow BOS, no evidence of LOB. Pt left in bed, wife present and all needs met. Pt will benefit from acute skilled PT to increase their independence and safety with mobility to allow discharge.     Recommendations for follow up therapy are one component of a multi-disciplinary discharge planning process, led by the attending physician.  Recommendations may be updated based on patient status, additional functional criteria and insurance authorization.  Follow Up Recommendations       Assistance Recommended at Discharge Frequent or constant Supervision/Assistance  Patient can return home with the following A little help with walking and/or  transfers;A little help with bathing/dressing/bathroom;Assistance with cooking/housework;Assist for transportation;Help with stairs or ramp for entrance   Equipment Recommendations  None recommended by PT    Recommendations for Other Services       Precautions / Restrictions Precautions Precautions: Fall Precaution Comments: impulsive Restrictions Weight Bearing Restrictions: No     Mobility  Bed Mobility Overal bed mobility: Needs Assistance Bed Mobility: Supine to Sit, Sit to Supine     Supine to sit: Min assist Sit to supine: Mod assist (wife helps pt at baseline with sit to supine for LE)   General bed mobility comments: cues increased time and HOB elevated    Transfers Overall transfer level: Needs assistance Equipment used: Rolling walker (2 wheels) Transfers: Sit to/from Stand, Bed to chair/wheelchair/BSC Sit to Stand: Min guard Stand pivot transfers: Min guard         General transfer comment: cues for proper UE placement with STS from EOB, SPT recliner to bed with increased cues for turn and proper location to sit on EOB    Ambulation/Gait Ambulation/Gait assistance: Min guard Gait Distance (Feet): 115 Feet Assistive device: Rolling walker (2 wheels) Gait Pattern/deviations: Narrow base of support, Step-through pattern Gait velocity: decreased         Stairs             Wheelchair Mobility    Modified Rankin (Stroke Patients Only)       Balance Overall balance assessment: Needs assistance Sitting-balance support: Feet supported, Bilateral upper extremity supported Sitting balance-Leahy Scale: Fair     Standing balance support: During functional activity, Bilateral upper extremity supported, Reliant on  assistive device for balance Standing balance-Leahy Scale: Poor                              Cognition Arousal/Alertness: Awake/alert (improved throughout session) Behavior During Therapy: Flat affect, Impulsive Overall  Cognitive Status: History of cognitive impairments - at baseline                                 General Comments: follows simple commands, using head nods and 1 word replies this session, wife present        Exercises      General Comments        Pertinent Vitals/Pain Pain Assessment Pain Assessment: Faces Faces Pain Scale: Hurts even more Pain Location: back Pain Descriptors / Indicators: Grimacing, Guarding Pain Intervention(s): Monitored during session (wife reports pt has pain when seated in recliner and B LE extended, able to reposition and pt elected to return to bed)    Home Living                          Prior Function            PT Goals (current goals can now be found in the care plan section) Acute Rehab PT Goals Patient Stated Goal: to go back  to Rehab-wife PT Goal Formulation: With patient/family Time For Goal Achievement: 04/14/22 Potential to Achieve Goals: Good    Frequency    Min 3X/week      PT Plan Current plan remains appropriate    Co-evaluation              AM-PAC PT "6 Clicks" Mobility   Outcome Measure  Help needed turning from your back to your side while in a flat bed without using bedrails?: A Little Help needed moving from lying on your back to sitting on the side of a flat bed without using bedrails?: A Little Help needed moving to and from a bed to a chair (including a wheelchair)?: A Little Help needed standing up from a chair using your arms (e.g., wheelchair or bedside chair)?: A Lot Help needed to walk in hospital room?: A Little Help needed climbing 3-5 steps with a railing? : A Lot 6 Click Score: 16    End of Session Equipment Utilized During Treatment: Gait belt Activity Tolerance: Patient tolerated treatment well Patient left: with family/visitor present;in bed;with call bell/phone within reach;with bed alarm set Nurse Communication: Mobility status (BP readings during session) PT  Visit Diagnosis: Unsteadiness on feet (R26.81);Difficulty in walking, not elsewhere classified (R26.2);Other symptoms and signs involving the nervous system (R29.898)     Time: [id number] PT Time Calculation (min) (ACUTE ONLY): 19 min  Charges:  $Gait Training: 8-22 mins                     Baird Lyons, PT    [place] Patter 04/03/2022, 11:18 AM

## 2022-04-04 LAB — CULTURE, BLOOD (ROUTINE X 2)
Culture: NO GROWTH
Special Requests: ADEQUATE

## 2022-04-06 LAB — CALCULI, WITH PHOTOGRAPH (CLINICAL LAB)
Calcium Oxalate Dihydrate: 20 %
Calcium Oxalate Monohydrate: 80 %
Weight Calculi: 52 mg

## 2022-04-07 DIAGNOSIS — I471 Supraventricular tachycardia, unspecified: Secondary | ICD-10-CM | POA: Diagnosis not present

## 2022-04-07 DIAGNOSIS — E1165 Type 2 diabetes mellitus with hyperglycemia: Secondary | ICD-10-CM | POA: Diagnosis not present

## 2022-04-07 DIAGNOSIS — D649 Anemia, unspecified: Secondary | ICD-10-CM | POA: Diagnosis not present

## 2022-04-07 DIAGNOSIS — K922 Gastrointestinal hemorrhage, unspecified: Secondary | ICD-10-CM | POA: Diagnosis not present

## 2022-04-07 DIAGNOSIS — K529 Noninfective gastroenteritis and colitis, unspecified: Secondary | ICD-10-CM | POA: Diagnosis not present

## 2022-04-07 DIAGNOSIS — R197 Diarrhea, unspecified: Secondary | ICD-10-CM | POA: Diagnosis not present

## 2022-04-07 DIAGNOSIS — E876 Hypokalemia: Secondary | ICD-10-CM | POA: Diagnosis not present

## 2022-04-07 DIAGNOSIS — I10 Essential (primary) hypertension: Secondary | ICD-10-CM | POA: Diagnosis not present

## 2022-04-07 DIAGNOSIS — N200 Calculus of kidney: Secondary | ICD-10-CM | POA: Diagnosis not present

## 2022-04-07 DIAGNOSIS — E785 Hyperlipidemia, unspecified: Secondary | ICD-10-CM | POA: Diagnosis not present

## 2022-04-07 DIAGNOSIS — G0400 Acute disseminated encephalitis and encephalomyelitis, unspecified: Secondary | ICD-10-CM | POA: Diagnosis not present

## 2022-04-07 DIAGNOSIS — G4700 Insomnia, unspecified: Secondary | ICD-10-CM | POA: Diagnosis not present

## 2022-04-07 NOTE — Telephone Encounter (Signed)
The patient was in the ED on 3/27 and got a MRI brain w/wo contrast done. He is calling wondering if can cancel the MRI scheduled 4/11 that was ordered by Dr. Felecia Shelling.

## 2022-04-09 NOTE — Telephone Encounter (Signed)
Called and spoke with wife (on [place]). Relayed Dr. Garth Bigness message. She verbalized understanding and appreciation. She will call [facility] and cx MRI brain scheduled for 04/16/22.

## 2022-04-10 ENCOUNTER — Ambulatory Visit (HOSPITAL_COMMUNITY): Payer: Medicare HMO

## 2022-04-16 ENCOUNTER — Other Ambulatory Visit: Payer: Medicare HMO | Admitting: Radiology

## 2022-04-16 ENCOUNTER — Other Ambulatory Visit (HOSPITAL_COMMUNITY): Payer: Medicare HMO

## 2022-04-16 ENCOUNTER — Ambulatory Visit (HOSPITAL_COMMUNITY)
Admission: RE | Admit: 2022-04-16 | Discharge: 2022-04-16 | Disposition: A | Discharge status: Home / Self Care | Payer: Medicare HMO | Source: Ambulatory Visit | Attending: Neurology | Admitting: Neurology

## 2022-04-16 ENCOUNTER — Ambulatory Visit (HOSPITAL_COMMUNITY): Admission: RE | Admit: 2022-04-16 | Payer: Medicare HMO | Source: Home / Self Care

## 2022-04-16 DIAGNOSIS — N390 Urinary tract infection, site not specified: Secondary | ICD-10-CM | POA: Diagnosis not present

## 2022-04-16 SURGERY — MRI WITH ANESTHESIA
Anesthesia: General

## 2022-04-20 ENCOUNTER — Telehealth: Payer: Medicare HMO | Admitting: Neurology

## 2022-04-20 ENCOUNTER — Encounter: Payer: Self-pay | Admitting: Neurology

## 2022-04-20 DIAGNOSIS — R269 Unspecified abnormalities of gait and mobility: Secondary | ICD-10-CM | POA: Diagnosis not present

## 2022-04-20 DIAGNOSIS — Z8673 Personal history of transient ischemic attack (TIA), and cerebral infarction without residual deficits: Secondary | ICD-10-CM | POA: Diagnosis not present

## 2022-04-20 DIAGNOSIS — Z8619 Personal history of other infectious and parasitic diseases: Secondary | ICD-10-CM | POA: Diagnosis not present

## 2022-04-20 DIAGNOSIS — G969 Disorder of central nervous system, unspecified: Secondary | ICD-10-CM

## 2022-04-20 DIAGNOSIS — Z8669 Personal history of other diseases of the nervous system and sense organs: Secondary | ICD-10-CM

## 2022-04-20 DIAGNOSIS — N390 Urinary tract infection, site not specified: Secondary | ICD-10-CM | POA: Diagnosis not present

## 2022-04-20 NOTE — Progress Notes (Addendum)
[facility]  PATIENT: Troy Randall DOB: 01/22/1960  REFERRING DOCTOR OR PCP:  Dr. Margo Aye SOURCE: Patient, extensive notes, laboratory tests, imaging results, other results from long hospitalization.  Imaging studies were personally reviewed.  _________________________________   HISTORICAL  CHIEF COMPLAINT:  Encephalopathy  HISTORY OF PRESENT ILLNESS:  Troy Randall is a 62 y.o. man with CNS demyelinating disease and history of strokes  Virtual Visit via Video Note I connected with [name] on 04/20/22 at  3:00 PM EDT by a video enabled telemedicine application and verified that I am speaking with the correct person.  Due to poor quality sound, after about 5 minutes of video we switched to telephone for the remainder of the visit   I discussed the limitations of evaluation and management by telemedicine and the availability of in person appointments. The patient expressed understanding and agreed to proceed.  Patient at home; provider in office  Update 04/20/2022: He has a more recent UTI and still has a stent in - he sees urology and wife thinks they will remove it tomorrow.    He has had multiple UTIs and generally does worse when one occurs.     For the most part, he has wife feels that he is doing about the same now as he did when I saw him 2 months ago.  I had a long discussion with him and his wife about his abnormal brain lesions and possibility of MS.  MRI before September 2023 showed a small number of foci most consistent with small lacunar infarctions and chronic microvascular ischemic change.  In September, he had dozens of enhancing lesions that were also hyperintense on diffusion weighted images.  Of note, he was septic at the time and required many days of IV antibiotics.    The issue has been whether he has demyelination or where the changes due to septic emboli.  If demyelination was this MS or ADEM.  Of note, the CSF did not show oligoclonal  bands.  He had an MRI in March 2024 and there were no new lesions.  He continues to have difficulty with gait.  He does PT in the house . He continues to note weakness in the legs.  He denies numbness.  Due to urinary retention, he has a Foley catheter.  He has cognitive impairments with reduced focus/attention and reduced processing.  Speech is slowed but no frank aphasia.  Physical examination: He is a well-developed well-nourished woman in no acute distress.  The head is normocephalic and atraumatic.  Sclera are anicteric.  Visible skin appears normal.  The neck has a good range of motion.  He e is alert and oriented.  Speech is slowed.  He answers questions appropriately.Marland Kitchen  Extraocular muscles are intact.  Facial strength is normal. Rapid alternating movements and finger-nose-finger are symmetric l.   History of neurologic events: He had had 2 strokes in the past, both affecting his speech.  The last stroke was in April 2023.  MRI 04/28/2021 had shown a small acute stroke in the subcortical white matter of the left frontal lobe.  Additionally there were several lacunar infarctions in the cerebral hemispheres and brainstem  He was neurologically baseline until early September 2023.  He had a normal morning on 09/04/2021 but noted GI bleed prompting him to go to the ED.   he had jejunal bleeding on colonoscopy treated with embolization.  He was noted to have altered mental status a couple days later while in  the hospital.  He was found to have sepsis.   He became less responsive.  He was noted to have renal failure and due to respiratory failure was intubated.      CT scan 09/12/2021 showed interval development of multiple white matter foci compared to April 2023 and MRI of the brain was recommended for further evaluation.  MRI of the brain 09/20/2021 and 09/21/2021 showed development of multiple enhancing foci in the hemispheres.  The differential diagnosis was multiple strokes versus demyelination.  CSF  evaluation 09/22/2021 showed normal cell count and protein and cultures.  IgG index was normal.  Oligoclonal bands were negative (he did have matched bands in both the serum and CSF).  HIV was negative.  He received IV steroids followed by plasmapheresis .  Due to GI bleeds he received multiple blood transfusions while in the hospital.  Initially, he had been unresponsive but had regained consciousness with treatment of the sepsis.  He continues to have reduced mental status for several weeks and began to improve towards the end of September, early October but remain mute for several more days.  He remained bedbound for several weeks and began to participate with PT more during October.  He was admitted to rehab on 10/30/2021.  He continues to do outpatient rehab.     Over the last month he has had multiple episodes of infection treated with antibiotics.  He is currently off of antibiotics.  He has improved physically and cognitively.   He can walk 50 feet with a walker now.  He communicates effectively and answers questions appropriately but cognition is not at baseline.  Imaging: MRI of the brain 04/01/2022 showed resolution of all of the enhancing lesions.  Some of them hypointense on both FLAIR and T1-weighted images.  There were no new lesions compared to September 2023.  MRI of the brain 09/20/2021 and 09/21/2021 showed multiple T2/FLAIR hyperintense foci in the hemispheres many of which enhanced after contrast.  Additional nonenhancing foci were noted in the cerebral hemispheres, left cerebellar hemisphere and left middle cerebellar peduncle.  All of the enhancing foci were new compared to the 04/28/2021 MRI and most of the nonenhancing foci were present on the previous MRI.  MRI of the brain 10/03/2021 showed multiple T2/FLAIR hyperintense foci in the cerebral hemispheres, cerebellum, left middle cerebellar peduncle, pons.  Some of the foci enhanced after contrast.  MRI of the cervical spine 10/03/2021  showed a normal spinal cord.  MRI of the thoracic spine 10/03/2021 showed subtle T2 hyperintense foci at T4-T5 and T6.  No definite enhancement noted.  Study was difficult to interpret due to movement artifact.  MRI of the brain 04/28/2021 showed several infratentorial and supratentorial lacunar infarctions.  There was an acute small stroke in the left frontal lobe.  Chronic lacunar infarctions were noted in the left middle cerebellar peduncle, inferior cerebellum and in the cerebral hemispheres, left greater than right  He has had additioanl UTIs since d/c and just finished a round of antibiotics.     He is doing PT and has made progress but other medical issues have led to temporary setback.     REVIEW OF SYSTEMS: Constitutional: No fevers, chills, sweats, or change in appetite Eyes: No visual changes, double vision, eye pain Ear, nose and throat: No hearing loss, ear pain, nasal congestion, sore throat Cardiovascular: No chest pain, palpitations Respiratory:  No shortness of breath at rest or with exertion.   No wheezes GastrointestinaI: No nausea, vomiting, diarrhea, abdominal  pain, fecal incontinence Genitourinary: Currently has Foley catheter musculoskeletal:  No neck pain, back pain Integumentary: No rash, pruritus, skin lesions Neurological: as above Psychiatric: No depression at this time.  No anxiety Endocrine: No palpitations, diaphoresis, change in appetite, change in weigh or increased thirst Hematologic/Lymphatic:  No anemia, purpura, petechiae. Allergic/Immunologic: No itchy/runny eyes, nasal congestion, recent allergic reactions, rashes  ALLERGIES: Allergies  Allergen Reactions   Bactrim [Sulfamethoxazole-Trimethoprim] Rash   Amoxicillin Hives    Tolerated Cefepime    HOME MEDICATIONS:  Current Outpatient Medications:    BD VEO INSULIN SYRINGE U/F 31G X 15/64" 1 ML MISC,  USE AS DIRECTED, Disp: 100 each, Rfl: 2   glucose blood (ONETOUCH VERIO) test strip, TEST twice a  day, Disp: 100 each, Rfl: 3   Insulin Pen Needle (NOVOTWIST) 32G X 5 MM MISC, Use two daily to inject Victoza and Toujeo., Disp: 90 each, Rfl: 5   methocarbamol (ROBAXIN) 500 MG tablet, Take 1 tablet (500 mg total) by mouth every 6 (six) hours as needed for muscle spasms., Disp: 30 tablet, Rfl: 0   metoprolol succinate (TOPROL-XL) 25 MG 24 hr tablet, Take 0.5 tablets (12.5 mg total) by mouth daily., Disp: 30 tablet, Rfl: 1   ONETOUCH DELICA LANCETS 33G MISC, Use to check blood sugar once a day dx code E11.65, Disp: 50 each, Rfl: 3   pantoprazole (PROTONIX) 40 MG tablet, Take 1 tablet (40 mg total) by mouth daily. (Patient taking differently: Take 40 mg by mouth daily before breakfast.), Disp: 30 tablet, Rfl: 0   potassium chloride SA (KLOR-CON M) 20 MEQ tablet, Take 2 tablets (40 mEq total) by mouth 2 (two) times daily., Disp: 10 tablet, Rfl: 0   Probiotic Product (PROBIOTIC PO), Take 8.5 mg by mouth daily., Disp: , Rfl:    rosuvastatin (CRESTOR) 40 MG tablet, Take 1 tablet (40 mg total) by mouth daily., Disp: 30 tablet, Rfl: 1   traZODone (DESYREL) 50 MG tablet, Take 0.5-1 tablets (25-50 mg total) by mouth at bedtime as needed for sleep. (Patient taking differently: Take 150 mg by mouth at bedtime.), Disp: , Rfl:    TRESIBA FLEXTOUCH 200 UNIT/ML FlexTouch Pen, Inject under skin 15 units twice a day. (Patient taking differently: Inject 16 Units into the skin See admin instructions. Inject 16 units into the skin 2 times a day- BGL must be 129 or greater), Disp: 3 mL, Rfl: 0   TYLENOL 500 MG tablet, Take 1 tablet (500 mg total) by mouth every 6 (six) hours as needed for mild pain or headache., Disp: 30 tablet, Rfl: 0  PAST MEDICAL HISTORY: Past Medical History:  Diagnosis Date   Anemia    Arthritis    CVA (cerebral vascular accident)    last CVA 04/2021   Diabetes mellitus without complication    type 2   Hyperlipidemia    Hypertension    Kidney stones     PAST SURGICAL HISTORY: Past  Surgical History:  Procedure Laterality Date   ANKLE SURGERY     BIOPSY  09/10/2021   Procedure: BIOPSY;  Surgeon: Jeani Hawking, MD;  Location: [facility] ENDOSCOPY;  Service: Gastroenterology;;   BIOPSY  10/07/2021   Procedure: BIOPSY;  Surgeon: Jeani Hawking, MD;  Location: [facility] ENDOSCOPY;  Service: Gastroenterology;;   COLONOSCOPY N/A 10/07/2021   Procedure: COLONOSCOPY;  Surgeon: Jeani Hawking, MD;  Location: [facility] ENDOSCOPY;  Service: Gastroenterology;  Laterality: N/A;   COLONOSCOPY WITH PROPOFOL N/A 09/06/2021   Procedure: COLONOSCOPY WITH PROPOFOL;  Surgeon: Dolores Frame,  MD;  Location: [facility] ENDO SUITE;  Service: Gastroenterology;  Laterality: N/A;   CYSTOSCOPY/URETEROSCOPY/HOLMIUM LASER/STENT PLACEMENT Left 03/30/2022   Procedure: CYSTOSCOPY/RETROGRADE/URETEROSCOPY/HOLMIUM LASER/STENT PLACEMENT;  Surgeon: Rene Paci, MD;  Location: [facility] ORS;  Service: Urology;  Laterality: Left;   ENTEROSCOPY N/A 09/10/2021   Procedure: ENTEROSCOPY;  Surgeon: Jeani Hawking, MD;  Location: [facility] ENDOSCOPY;  Service: Gastroenterology;  Laterality: N/A;   ENTEROSCOPY  09/06/2021   Procedure: ENTEROSCOPY;  Surgeon: Dolores Frame, MD;  Location: [facility] ENDO SUITE;  Service: Gastroenterology;;   ESOPHAGOGASTRODUODENOSCOPY (EGD) WITH PROPOFOL  09/06/2021   Procedure: ESOPHAGOGASTRODUODENOSCOPY (EGD) WITH PROPOFOL;  Surgeon: Dolores Frame, MD;  Location: [facility] ENDO SUITE;  Service: Gastroenterology;;   Wenda Low SIGMOIDOSCOPY N/A 09/19/2021   Procedure: Arnell Sieving;  Surgeon: Jeani Hawking, MD;  Location: [facility] ENDOSCOPY;  Service: Gastroenterology;  Laterality: N/A;   FOREIGN BODY REMOVAL  09/19/2021   Procedure: FOREIGN BODY REMOVAL;  Surgeon: Jeani Hawking, MD;  Location: [facility] ENDOSCOPY;  Service: Gastroenterology;;   Emelda Brothers CAPSULE STUDY  09/06/2021   Procedure: GIVENS CAPSULE STUDY;  Surgeon: Dolores Frame, MD;  Location: [facility] ENDO SUITE;  Service: Gastroenterology;;   HERNIA  REPAIR     umbilical x1 Incisional x1   INCISIONAL HERNIA REPAIR  04/13/2011   Procedure: LAPAROSCOPIC INCISIONAL HERNIA;  Surgeon: Dalia Heading, MD;  Location: [facility] ORS;  Service: General;  Laterality: N/A;  Recurrent Laparoscopic Incisional Herniorraphy with Mesh   IR ANGIOGRAM SELECTIVE EACH ADDITIONAL VESSEL  09/09/2021   IR ANGIOGRAM VISCERAL SELECTIVE  09/07/2021   IR ANGIOGRAM VISCERAL SELECTIVE  09/06/2021   IR ANGIOGRAM VISCERAL SELECTIVE  09/06/2021   IR CHOLANGIOGRAM EXISTING TUBE  11/06/2021   IR EMBO ART  VEN HEMORR LYMPH EXTRAV  INC GUIDE ROADMAPPING  09/06/2021   IR EXCHANGE BILIARY DRAIN  10/24/2021   IR EXCHANGE BILIARY DRAIN  11/19/2021   IR EXCHANGE BILIARY DRAIN  01/19/2022   IR EXCHANGE BILIARY DRAIN  02/19/2022   IR EXCHANGE BILIARY DRAIN  04/01/2022   IR GUIDED DRAIN W CATHETER PLACEMENT  09/07/2021   IR US GUIDE BX ASP/DRAIN  09/07/2021   IR US GUIDE VASC ACCESS RIGHT  09/07/2021   IR US GUIDE VASC ACCESS RIGHT  09/06/2021   KIDNEY STONE SURGERY     RADIOLOGY WITH ANESTHESIA N/A 03/10/2022   Procedure: MRI WITH ANESTHESIA OF BRAIN WITH AND WITHOUT CONTRAST;  Surgeon: Radiologist, Medication, MD;  Location: [facility] OR;  Service: Radiology;  Laterality: N/A;    FAMILY HISTORY: Family History  Problem Relation Age of Onset   Heart failure Mother    Diabetes Father    Cancer Other    Heart attack Other    Anesthesia problems Neg Hx    Hypotension Neg Hx    Malignant hyperthermia Neg Hx    Pseudochol deficiency Neg Hx    Colon cancer Neg Hx    Inflammatory bowel disease Neg Hx     SOCIAL HISTORY: Social History   Socioeconomic History   Marital status: Married    Spouse name: Not on file   Number of children: Not on file   Years of education: Not on file   Highest education level: Not on file  Occupational History   Not on file  Tobacco Use   Smoking status: Never   Smokeless tobacco: Former    Types: Snuff  Vaping Use   Vaping Use: Never used  Substance and Sexual  Activity   Alcohol use: No   Drug use: No  Sexual activity: Yes    Birth control/protection: None  Other Topics Concern   Not on file  Social History Narrative   Live in [place], [place].   Married for over 30 years.   Has 2 children, age 25/30s. Has one grandchild, 19 months.    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    Eats all food groups.   Wears seatbelt.    Dip 1 can a day.    Social Determinants of Health   Financial Resource Strain: Not on file  Food Insecurity: No Food Insecurity (03/31/2022)   Hunger Vital Sign    Worried About Running Out of Food in the Last Year: Never true    Ran Out of Food in the Last Year: Never true  Transportation Needs: No Transportation Needs (03/31/2022)   PRAPARE - Administrator, Civil Service (Medical): No    Lack of Transportation (Non-Medical): No  Physical Activity: Not on file  Stress: Not on file  Social Connections: Not on file  Intimate Partner Violence: Not At Risk (03/31/2022)   Humiliation, Afraid, Rape, and Kick questionnaire    Fear of Current or Ex-Partner: No    Emotionally Abused: No    Physically Abused: No    Sexually Abused: No         DIAGNOSTIC DATA (LABS, IMAGING, TESTING) - I reviewed patient records, labs, notes, testing and imaging myself where available.  Lab Results  Component Value Date   WBC 6.0 03/31/2022   HGB 11.3 (L) 03/31/2022   HCT 36.1 (L) 03/31/2022   MCV 86.8 03/31/2022   PLT 175 03/31/2022      Component Value Date/Time   NA 134 (L) 04/03/2022 0339   K 3.1 (L) 04/03/2022 0339   CL 106 04/03/2022 0339   CO2 20 (L) 04/03/2022 0339   GLUCOSE 116 (H) 04/03/2022 0339   BUN 11 04/03/2022 0339   CREATININE 0.67 04/03/2022 0339   CREATININE 0.90 03/31/2017 1625   CALCIUM 8.1 (L) 04/03/2022 0339   PROT 6.1 (L) 03/30/2022 0321   ALBUMIN 2.9 (L) 04/03/2022 0339   ALBUMIN 1.9 (L) 09/22/2021 1348   AST 35 03/30/2022 0321   ALT 33 03/30/2022  0321   ALKPHOS 58 03/30/2022 0321   BILITOT 1.0 03/30/2022 0321   GFRNONAA >60 04/03/2022 0339   GFRAA >90 04/07/2011 0927   Lab Results  Component Value Date   CHOL 117 04/29/2021   HDL 32 (L) 04/29/2021   LDLCALC 66 04/29/2021   LDLDIRECT 101.0 01/21/2016   TRIG 93 04/29/2021   CHOLHDL 3.7 04/29/2021   Lab Results  Component Value Date   HGBA1C 6.6 (H) 03/29/2022   Lab Results  Component Value Date   VITAMINB12 1,427 (H) 09/22/2021   Lab Results  Component Value Date   TSH 1.057 11/07/2021       ASSESSMENT AND PLAN  Encephalomyelopathy  History of sepsis  Urinary tract infection without hematuria, site unspecified  Gait disturbance  History of encephalopathy  History of multiple strokes   The differential diagnosis remains encephalopathy due to changes from septic emboli, ADEM or MS.  He was septic at the time of the changes.  It would be unusual for MS to have dozens of simultaneously enhancing lesions.  Recent MRI did not show any new lesions and CSF was normal.  The spinal cord is no lesions.  Therefore I think septic emboli is the most likely cause of the multiple  enhancing lesions in September 2023.  I do not recommend just beginning a disease modifying therapy at this time.  I did discuss with him and his wife that if he did not have some of the health issues I might consider adding 1 like Aubagio as it is generally very well tolerated and safe.  However, due to all of his medical issues I am reluctant to add anything without being far more certain of the diagnosis He will continue physical therapy.  He is currently using a walker and if he continues to improve will hopefully be able to get to a cane or even walk independently again.  He should stay active. Follow-up in 6 months I and family should call sooner if there are new or worsening neurologic symptoms.   Follow Up Instructions: I discussed the assessment and treatment plan with the patient. The  patient was provided an opportunity to ask questions and all were answered. The patient agreed with the plan and demonstrated an understanding of the instructions.    The patient was advised to call back or seek an in-person evaluation if the symptoms worsen or if the condition fails to improve as anticipated.  I provided 25 minutes of non-face-to-face time during this encounter.  This visit is part of a comprehensive longitudinal care medical relationship regarding the patients primary diagnosis of encephalopathy and related concerns.    Greggory Safranek A. Epimenio Foot, MD, [facility] 04/20/2022, 3:39 PM Certified in Neurology, Clinical Neurophysiology, Sleep Medicine and Neuroimaging  [facility] [address], [address] [place], [place] [address] [phone]

## 2022-04-20 NOTE — Addendum Note (Signed)
Addended by: Despina Arias A on: 04/20/2022 03:56 PM   Modules accepted: Level of Service

## 2022-04-21 ENCOUNTER — Ambulatory Visit: Payer: Medicare HMO | Admitting: Urology

## 2022-04-21 VITALS — BP 123/72 | HR 79

## 2022-04-21 DIAGNOSIS — N401 Enlarged prostate with lower urinary tract symptoms: Secondary | ICD-10-CM

## 2022-04-21 DIAGNOSIS — R339 Retention of urine, unspecified: Secondary | ICD-10-CM

## 2022-04-21 DIAGNOSIS — N201 Calculus of ureter: Secondary | ICD-10-CM

## 2022-04-21 DIAGNOSIS — N138 Other obstructive and reflux uropathy: Secondary | ICD-10-CM

## 2022-04-21 DIAGNOSIS — Z87442 Personal history of urinary calculi: Secondary | ICD-10-CM | POA: Diagnosis not present

## 2022-04-21 DIAGNOSIS — Z2989 Encounter for other specified prophylactic measures: Secondary | ICD-10-CM

## 2022-04-21 MED ORDER — GENTAMICIN SULFATE 40 MG/ML IJ SOLN
80.0000 mg | Freq: Once | INTRAMUSCULAR | Status: AC
  Administered 2022-04-21: 80 mg via INTRAMUSCULAR

## 2022-04-21 NOTE — Progress Notes (Signed)
History of Present Illness: 62 year old male here for follow-up.  Initially seen by me in December 2023.  The patient had acute urinary retention.  He was admitted to the hospital prior to that appointment with a GI bleed, encephalitis and examined his cholecystitis.  When I saw him, he was still significantly debilitated.  It was felt best to leave him with a catheter at least in the short-term  He underwent ureteroscopic management of a left UVJ stone by Dr. Devoria Glassing on 03/30/2022.  Past Medical History:  Diagnosis Date   Anemia    Arthritis    CVA (cerebral vascular accident)    last CVA 04/2021   Diabetes mellitus without complication    type 2   Hyperlipidemia    Hypertension    Kidney stones     Past Surgical History:  Procedure Laterality Date   ANKLE SURGERY     BIOPSY  09/10/2021   Procedure: BIOPSY;  Surgeon: Jeani Hawking, MD;  Location: [facility] ENDOSCOPY;  Service: Gastroenterology;;   BIOPSY  10/07/2021   Procedure: BIOPSY;  Surgeon: Jeani Hawking, MD;  Location: [facility] ENDOSCOPY;  Service: Gastroenterology;;   COLONOSCOPY N/A 10/07/2021   Procedure: COLONOSCOPY;  Surgeon: Jeani Hawking, MD;  Location: [facility] ENDOSCOPY;  Service: Gastroenterology;  Laterality: N/A;   COLONOSCOPY WITH PROPOFOL N/A 09/06/2021   Procedure: COLONOSCOPY WITH PROPOFOL;  Surgeon: Dolores Frame, MD;  Location: [facility];  Service: Gastroenterology;  Laterality: N/A;   CYSTOSCOPY/URETEROSCOPY/HOLMIUM LASER/STENT PLACEMENT Left 03/30/2022   Procedure: CYSTOSCOPY/RETROGRADE/URETEROSCOPY/HOLMIUM LASER/STENT PLACEMENT;  Surgeon: Rene Paci, MD;  Location: [facility] ORS;  Service: Urology;  Laterality: Left;   ENTEROSCOPY N/A 09/10/2021   Procedure: ENTEROSCOPY;  Surgeon: Jeani Hawking, MD;  Location: [facility] ENDOSCOPY;  Service: Gastroenterology;  Laterality: N/A;   ENTEROSCOPY  09/06/2021   Procedure: ENTEROSCOPY;  Surgeon: Dolores Frame, MD;  Location: [facility];  Service:  Gastroenterology;;   ESOPHAGOGASTRODUODENOSCOPY (EGD) WITH PROPOFOL  09/06/2021   Procedure: ESOPHAGOGASTRODUODENOSCOPY (EGD) WITH PROPOFOL;  Surgeon: Dolores Frame, MD;  Location: [facility];  Service: Gastroenterology;;   Wenda Low SIGMOIDOSCOPY N/A 09/19/2021   Procedure: Arnell Sieving;  Surgeon: Jeani Hawking, MD;  Location: [facility] ENDOSCOPY;  Service: Gastroenterology;  Laterality: N/A;   FOREIGN BODY REMOVAL  09/19/2021   Procedure: FOREIGN BODY REMOVAL;  Surgeon: Jeani Hawking, MD;  Location: [facility] ENDOSCOPY;  Service: Gastroenterology;;   Emelda Brothers CAPSULE STUDY  09/06/2021   Procedure: GIVENS CAPSULE STUDY;  Surgeon: Dolores Frame, MD;  Location: [facility];  Service: Gastroenterology;;   HERNIA REPAIR     umbilical x1 Incisional x1   INCISIONAL HERNIA REPAIR  04/13/2011   Procedure: LAPAROSCOPIC INCISIONAL HERNIA;  Surgeon: Dalia Heading, MD;  Location: [facility] OR[facility];  Service: General;  Laterality: N/A;  Recurrent Laparoscopic Incisional Herniorraphy with Mesh   IR ANGIOGRAM SELECTIVE EACH ADDITIONAL VESSEL  09/09/2021   IR ANGIOGRAM VISCERAL SELECTIVE  09/07/2021   IR ANGIOGRAM VISCERAL SELECTIVE  09/06/2021   IR ANGIOGRAM VISCERAL SELECTIVE  09/06/2021   IR CHOLANGIOGRAM EXISTING TUBE  11/06/2021   IR EMBO ART  VEN HEMORR LYMPH EXTRAV  INC GUIDE ROADMAPPING  09/06/2021   IR EXCHANGE BILIARY DRAIN  10/24/2021   IR EXCHANGE BILIARY DRAIN  11/19/2021   IR EXCHANGE BILIARY DRAIN  01/19/2022   IR EXCHANGE BILIARY DRAIN  02/19/2022   IR EXCHANGE BILIARY DRAIN  04/01/2022   IR GUIDED DRAIN W CATHETER PLACEMENT  09/07/2021   IR US GUIDE BX ASP/DRAIN  09/07/2021  IR US GUIDE VASC ACCESS RIGHT  09/07/2021   IR US GUIDE VASC ACCESS RIGHT  09/06/2021   KIDNEY STONE SURGERY     RADIOLOGY WITH ANESTHESIA N/A 03/10/2022   Procedure: MRI WITH ANESTHESIA OF BRAIN WITH AND WITHOUT CONTRAST;  Surgeon: Radiologist, Medication, MD;  Location: [facility] OR;  Service: Radiology;  Laterality: N/A;    Home  Medications:  Allergies as of 04/21/2022       Reactions   Bactrim [sulfamethoxazole-trimethoprim] Rash   Amoxicillin Hives   Tolerated Cefepime        Medication List        Accurate as of April 21, 2022  8:59 AM. If you have any questions, ask your nurse or doctor.          BD Veo Insulin Syringe U/F 31G X 15/64" 1 ML Misc Generic drug: Insulin Syringe-Needle U-100 USE AS DIRECTED   glucose blood test strip Commonly known as: OneTouch Verio TEST twice a day   Insulin Pen Needle 32G X 5 MM Misc Commonly known as: NovoTwist Use two daily to inject Victoza and Toujeo.   methocarbamol 500 MG tablet Commonly known as: ROBAXIN Take 1 tablet (500 mg total) by mouth every 6 (six) hours as needed for muscle spasms.   metoprolol succinate 25 MG 24 hr tablet Commonly known as: TOPROL-XL Take 0.5 tablets (12.5 mg total) by mouth daily.   OneTouch Delica Lancets 33G Misc Use to check blood sugar once a day dx code E11.65   pantoprazole 40 MG tablet Commonly known as: PROTONIX Take 1 tablet (40 mg total) by mouth daily. What changed: when to take this   potassium chloride SA 20 MEQ tablet Commonly known as: KLOR-CON M Take 2 tablets (40 mEq total) by mouth 2 (two) times daily.   PROBIOTIC PO Take 8.5 mg by mouth daily.   rosuvastatin 40 MG tablet Commonly known as: Crestor Take 1 tablet (40 mg total) by mouth daily.   traZODone 50 MG tablet Commonly known as: DESYREL Take 0.5-1 tablets (25-50 mg total) by mouth at bedtime as needed for sleep. What changed:  how much to take when to take this   Tresiba FlexTouch 200 UNIT/ML FlexTouch Pen Generic drug: insulin degludec Inject under skin 15 units twice a day. What changed:  how much to take how to take this when to take this additional instructions   TYLENOL 500 MG tablet Generic drug: acetaminophen Take 1 tablet (500 mg total) by mouth every 6 (six) hours as needed for mild pain or headache.         Allergies:  Allergies  Allergen Reactions   Bactrim [Sulfamethoxazole-Trimethoprim] Rash   Amoxicillin Hives    Tolerated Cefepime    Family History  Problem Relation Age of Onset   Heart failure Mother    Diabetes Father    Cancer Other    Heart attack Other    Anesthesia problems Neg Hx    Hypotension Neg Hx    Malignant hyperthermia Neg Hx    Pseudochol deficiency Neg Hx    Colon cancer Neg Hx    Inflammatory bowel disease Neg Hx     Social History:  reports that he has never smoked. He has quit using smokeless tobacco.  His smokeless tobacco use included snuff. He reports that he does not drink alcohol and does not use drugs.  ROS: A complete review of systems was performed.  All systems are negative except for pertinent findings as noted.  Physical Exam:  Vital signs in last 24 hours: There were no vitals taken for this visit. Constitutional:  Alert and oriented, No acute distress Cardiovascular: Regular rate  Respiratory: Normal respiratory effort Neurologic: Grossly intact, no focal deficits Psychiatric: Normal mood and affect  I have reviewed prior pt notes  I have reviewed notes from referring/previous physicians  I have reviewed urinalysis results  I have independently reviewed prior imaging--recent CT images  I have reviewed prior urine culture--mult species seen  Stent removed Impression/Assessment:  Incomplete bladder emptying--indwelling catheter present, changed today  Lt UVJ stone, recently removed   Plan:  Catheter changed today  160 mg gent administered following stent removal  OV w/ me 3 mos

## 2022-04-21 NOTE — Progress Notes (Signed)
Cath Change/ Replacement  Patient is present today for a catheter change due to urinary retention.  10 ml of water was removed from the balloon, a 16 FR foley cath was removed without difficulty.  Patient was cleaned and prepped in a sterile fashion with betadine . A 16 FR foley cath was replaced into the bladder, no complications were noted. Urine return was noted 3 ml and urine was browish in color. The balloon was filled with 6ml of sterile water. A night bag was attached for drainage.  A night bag was also given to the patient and patient was given instruction on how to change from one bag to another. Patient was given proper instruction on catheter care.    Performed by: Kennyth Lose, CMA  Follow up: Keep scheduled office visit

## 2022-04-24 DIAGNOSIS — N200 Calculus of kidney: Secondary | ICD-10-CM | POA: Diagnosis not present

## 2022-04-24 DIAGNOSIS — Z8673 Personal history of transient ischemic attack (TIA), and cerebral infarction without residual deficits: Secondary | ICD-10-CM | POA: Diagnosis not present

## 2022-04-24 DIAGNOSIS — N390 Urinary tract infection, site not specified: Secondary | ICD-10-CM | POA: Diagnosis not present

## 2022-04-24 DIAGNOSIS — E119 Type 2 diabetes mellitus without complications: Secondary | ICD-10-CM | POA: Diagnosis not present

## 2022-04-24 DIAGNOSIS — N3281 Overactive bladder: Secondary | ICD-10-CM | POA: Diagnosis not present

## 2022-04-24 DIAGNOSIS — I471 Supraventricular tachycardia, unspecified: Secondary | ICD-10-CM | POA: Diagnosis not present

## 2022-04-24 DIAGNOSIS — E876 Hypokalemia: Secondary | ICD-10-CM | POA: Diagnosis not present

## 2022-04-24 DIAGNOSIS — R339 Retention of urine, unspecified: Secondary | ICD-10-CM | POA: Diagnosis not present

## 2022-04-24 DIAGNOSIS — Z466 Encounter for fitting and adjustment of urinary device: Secondary | ICD-10-CM | POA: Diagnosis not present

## 2022-04-24 DIAGNOSIS — Z9181 History of falling: Secondary | ICD-10-CM | POA: Diagnosis not present

## 2022-04-24 DIAGNOSIS — D649 Anemia, unspecified: Secondary | ICD-10-CM | POA: Diagnosis not present

## 2022-04-24 DIAGNOSIS — E785 Hyperlipidemia, unspecified: Secondary | ICD-10-CM | POA: Diagnosis not present

## 2022-04-24 DIAGNOSIS — Z6828 Body mass index (BMI) 28.0-28.9, adult: Secondary | ICD-10-CM | POA: Diagnosis not present

## 2022-04-24 DIAGNOSIS — I10 Essential (primary) hypertension: Secondary | ICD-10-CM | POA: Diagnosis not present

## 2022-04-24 DIAGNOSIS — Z436 Encounter for attention to other artificial openings of urinary tract: Secondary | ICD-10-CM | POA: Diagnosis not present

## 2022-04-24 DIAGNOSIS — G35 Multiple sclerosis: Secondary | ICD-10-CM | POA: Diagnosis not present

## 2022-04-25 DIAGNOSIS — J9611 Chronic respiratory failure with hypoxia: Secondary | ICD-10-CM | POA: Diagnosis not present

## 2022-04-25 DIAGNOSIS — G0400 Acute disseminated encephalitis and encephalomyelitis, unspecified: Secondary | ICD-10-CM | POA: Diagnosis not present

## 2022-04-25 DIAGNOSIS — R5381 Other malaise: Secondary | ICD-10-CM | POA: Diagnosis not present

## 2022-04-25 DIAGNOSIS — M5440 Lumbago with sciatica, unspecified side: Secondary | ICD-10-CM | POA: Diagnosis not present

## 2022-04-25 DIAGNOSIS — G8929 Other chronic pain: Secondary | ICD-10-CM | POA: Diagnosis not present

## 2022-04-27 DIAGNOSIS — M5440 Lumbago with sciatica, unspecified side: Secondary | ICD-10-CM | POA: Diagnosis not present

## 2022-04-27 DIAGNOSIS — I10 Essential (primary) hypertension: Secondary | ICD-10-CM | POA: Diagnosis not present

## 2022-04-27 DIAGNOSIS — Z466 Encounter for fitting and adjustment of urinary device: Secondary | ICD-10-CM | POA: Diagnosis not present

## 2022-04-27 DIAGNOSIS — J9611 Chronic respiratory failure with hypoxia: Secondary | ICD-10-CM | POA: Diagnosis not present

## 2022-04-27 DIAGNOSIS — D649 Anemia, unspecified: Secondary | ICD-10-CM | POA: Diagnosis not present

## 2022-04-27 DIAGNOSIS — N200 Calculus of kidney: Secondary | ICD-10-CM | POA: Diagnosis not present

## 2022-04-27 DIAGNOSIS — I471 Supraventricular tachycardia, unspecified: Secondary | ICD-10-CM | POA: Diagnosis not present

## 2022-04-27 DIAGNOSIS — E119 Type 2 diabetes mellitus without complications: Secondary | ICD-10-CM | POA: Diagnosis not present

## 2022-04-27 DIAGNOSIS — Z8673 Personal history of transient ischemic attack (TIA), and cerebral infarction without residual deficits: Secondary | ICD-10-CM | POA: Diagnosis not present

## 2022-04-27 DIAGNOSIS — N390 Urinary tract infection, site not specified: Secondary | ICD-10-CM | POA: Diagnosis not present

## 2022-04-27 DIAGNOSIS — Z436 Encounter for attention to other artificial openings of urinary tract: Secondary | ICD-10-CM | POA: Diagnosis not present

## 2022-04-27 DIAGNOSIS — G0400 Acute disseminated encephalitis and encephalomyelitis, unspecified: Secondary | ICD-10-CM | POA: Diagnosis not present

## 2022-04-27 DIAGNOSIS — Z6828 Body mass index (BMI) 28.0-28.9, adult: Secondary | ICD-10-CM | POA: Diagnosis not present

## 2022-04-27 DIAGNOSIS — G35 Multiple sclerosis: Secondary | ICD-10-CM | POA: Diagnosis not present

## 2022-04-27 DIAGNOSIS — G8929 Other chronic pain: Secondary | ICD-10-CM | POA: Diagnosis not present

## 2022-04-27 DIAGNOSIS — Z9181 History of falling: Secondary | ICD-10-CM | POA: Diagnosis not present

## 2022-04-27 DIAGNOSIS — E785 Hyperlipidemia, unspecified: Secondary | ICD-10-CM | POA: Diagnosis not present

## 2022-04-27 DIAGNOSIS — N3281 Overactive bladder: Secondary | ICD-10-CM | POA: Diagnosis not present

## 2022-04-27 DIAGNOSIS — E876 Hypokalemia: Secondary | ICD-10-CM | POA: Diagnosis not present

## 2022-04-27 DIAGNOSIS — R5381 Other malaise: Secondary | ICD-10-CM | POA: Diagnosis not present

## 2022-04-27 DIAGNOSIS — R339 Retention of urine, unspecified: Secondary | ICD-10-CM | POA: Diagnosis not present

## 2022-04-30 DIAGNOSIS — E876 Hypokalemia: Secondary | ICD-10-CM | POA: Diagnosis not present

## 2022-04-30 DIAGNOSIS — N399 Disorder of urinary system, unspecified: Secondary | ICD-10-CM | POA: Diagnosis not present

## 2022-04-30 DIAGNOSIS — Z8673 Personal history of transient ischemic attack (TIA), and cerebral infarction without residual deficits: Secondary | ICD-10-CM | POA: Diagnosis not present

## 2022-04-30 DIAGNOSIS — Z436 Encounter for attention to other artificial openings of urinary tract: Secondary | ICD-10-CM | POA: Diagnosis not present

## 2022-04-30 DIAGNOSIS — I10 Essential (primary) hypertension: Secondary | ICD-10-CM | POA: Diagnosis not present

## 2022-04-30 DIAGNOSIS — D649 Anemia, unspecified: Secondary | ICD-10-CM | POA: Diagnosis not present

## 2022-04-30 DIAGNOSIS — E785 Hyperlipidemia, unspecified: Secondary | ICD-10-CM | POA: Diagnosis not present

## 2022-04-30 DIAGNOSIS — I471 Supraventricular tachycardia, unspecified: Secondary | ICD-10-CM | POA: Diagnosis not present

## 2022-04-30 DIAGNOSIS — N390 Urinary tract infection, site not specified: Secondary | ICD-10-CM | POA: Diagnosis not present

## 2022-04-30 DIAGNOSIS — N200 Calculus of kidney: Secondary | ICD-10-CM | POA: Diagnosis not present

## 2022-04-30 DIAGNOSIS — Z466 Encounter for fitting and adjustment of urinary device: Secondary | ICD-10-CM | POA: Diagnosis not present

## 2022-04-30 DIAGNOSIS — R339 Retention of urine, unspecified: Secondary | ICD-10-CM | POA: Diagnosis not present

## 2022-04-30 DIAGNOSIS — N3281 Overactive bladder: Secondary | ICD-10-CM | POA: Diagnosis not present

## 2022-04-30 DIAGNOSIS — E119 Type 2 diabetes mellitus without complications: Secondary | ICD-10-CM | POA: Diagnosis not present

## 2022-04-30 DIAGNOSIS — G35 Multiple sclerosis: Secondary | ICD-10-CM | POA: Diagnosis not present

## 2022-04-30 DIAGNOSIS — Z6828 Body mass index (BMI) 28.0-28.9, adult: Secondary | ICD-10-CM | POA: Diagnosis not present

## 2022-04-30 DIAGNOSIS — Z9181 History of falling: Secondary | ICD-10-CM | POA: Diagnosis not present

## 2022-05-04 ENCOUNTER — Other Ambulatory Visit (HOSPITAL_COMMUNITY): Payer: Self-pay

## 2022-05-04 DIAGNOSIS — E785 Hyperlipidemia, unspecified: Secondary | ICD-10-CM | POA: Diagnosis not present

## 2022-05-04 DIAGNOSIS — N390 Urinary tract infection, site not specified: Secondary | ICD-10-CM | POA: Diagnosis not present

## 2022-05-04 DIAGNOSIS — Z436 Encounter for attention to other artificial openings of urinary tract: Secondary | ICD-10-CM | POA: Diagnosis not present

## 2022-05-04 DIAGNOSIS — E876 Hypokalemia: Secondary | ICD-10-CM | POA: Diagnosis not present

## 2022-05-04 DIAGNOSIS — D649 Anemia, unspecified: Secondary | ICD-10-CM | POA: Diagnosis not present

## 2022-05-04 DIAGNOSIS — Z9181 History of falling: Secondary | ICD-10-CM | POA: Diagnosis not present

## 2022-05-04 DIAGNOSIS — I10 Essential (primary) hypertension: Secondary | ICD-10-CM | POA: Diagnosis not present

## 2022-05-04 DIAGNOSIS — Z6828 Body mass index (BMI) 28.0-28.9, adult: Secondary | ICD-10-CM | POA: Diagnosis not present

## 2022-05-04 DIAGNOSIS — Z466 Encounter for fitting and adjustment of urinary device: Secondary | ICD-10-CM | POA: Diagnosis not present

## 2022-05-04 DIAGNOSIS — Z8673 Personal history of transient ischemic attack (TIA), and cerebral infarction without residual deficits: Secondary | ICD-10-CM | POA: Diagnosis not present

## 2022-05-04 DIAGNOSIS — I471 Supraventricular tachycardia, unspecified: Secondary | ICD-10-CM | POA: Diagnosis not present

## 2022-05-04 DIAGNOSIS — N3281 Overactive bladder: Secondary | ICD-10-CM | POA: Diagnosis not present

## 2022-05-04 DIAGNOSIS — E119 Type 2 diabetes mellitus without complications: Secondary | ICD-10-CM | POA: Diagnosis not present

## 2022-05-04 DIAGNOSIS — R339 Retention of urine, unspecified: Secondary | ICD-10-CM | POA: Diagnosis not present

## 2022-05-04 DIAGNOSIS — G35 Multiple sclerosis: Secondary | ICD-10-CM | POA: Diagnosis not present

## 2022-05-04 DIAGNOSIS — N200 Calculus of kidney: Secondary | ICD-10-CM | POA: Diagnosis not present

## 2022-05-06 DIAGNOSIS — Z6828 Body mass index (BMI) 28.0-28.9, adult: Secondary | ICD-10-CM | POA: Diagnosis not present

## 2022-05-06 DIAGNOSIS — E119 Type 2 diabetes mellitus without complications: Secondary | ICD-10-CM | POA: Diagnosis not present

## 2022-05-06 DIAGNOSIS — D649 Anemia, unspecified: Secondary | ICD-10-CM | POA: Diagnosis not present

## 2022-05-06 DIAGNOSIS — Z8673 Personal history of transient ischemic attack (TIA), and cerebral infarction without residual deficits: Secondary | ICD-10-CM | POA: Diagnosis not present

## 2022-05-06 DIAGNOSIS — Z436 Encounter for attention to other artificial openings of urinary tract: Secondary | ICD-10-CM | POA: Diagnosis not present

## 2022-05-06 DIAGNOSIS — N390 Urinary tract infection, site not specified: Secondary | ICD-10-CM | POA: Diagnosis not present

## 2022-05-06 DIAGNOSIS — I10 Essential (primary) hypertension: Secondary | ICD-10-CM | POA: Diagnosis not present

## 2022-05-06 DIAGNOSIS — Z9181 History of falling: Secondary | ICD-10-CM | POA: Diagnosis not present

## 2022-05-06 DIAGNOSIS — I471 Supraventricular tachycardia, unspecified: Secondary | ICD-10-CM | POA: Diagnosis not present

## 2022-05-06 DIAGNOSIS — E876 Hypokalemia: Secondary | ICD-10-CM | POA: Diagnosis not present

## 2022-05-06 DIAGNOSIS — R339 Retention of urine, unspecified: Secondary | ICD-10-CM | POA: Diagnosis not present

## 2022-05-06 DIAGNOSIS — G35 Multiple sclerosis: Secondary | ICD-10-CM | POA: Diagnosis not present

## 2022-05-06 DIAGNOSIS — Z466 Encounter for fitting and adjustment of urinary device: Secondary | ICD-10-CM | POA: Diagnosis not present

## 2022-05-06 DIAGNOSIS — N3281 Overactive bladder: Secondary | ICD-10-CM | POA: Diagnosis not present

## 2022-05-06 DIAGNOSIS — E785 Hyperlipidemia, unspecified: Secondary | ICD-10-CM | POA: Diagnosis not present

## 2022-05-06 DIAGNOSIS — N200 Calculus of kidney: Secondary | ICD-10-CM | POA: Diagnosis not present

## 2022-05-12 DIAGNOSIS — E876 Hypokalemia: Secondary | ICD-10-CM | POA: Diagnosis not present

## 2022-05-12 DIAGNOSIS — N390 Urinary tract infection, site not specified: Secondary | ICD-10-CM | POA: Diagnosis not present

## 2022-05-12 DIAGNOSIS — I471 Supraventricular tachycardia, unspecified: Secondary | ICD-10-CM | POA: Diagnosis not present

## 2022-05-12 DIAGNOSIS — Z8673 Personal history of transient ischemic attack (TIA), and cerebral infarction without residual deficits: Secondary | ICD-10-CM | POA: Diagnosis not present

## 2022-05-12 DIAGNOSIS — Z436 Encounter for attention to other artificial openings of urinary tract: Secondary | ICD-10-CM | POA: Diagnosis not present

## 2022-05-12 DIAGNOSIS — D649 Anemia, unspecified: Secondary | ICD-10-CM | POA: Diagnosis not present

## 2022-05-12 DIAGNOSIS — N3281 Overactive bladder: Secondary | ICD-10-CM | POA: Diagnosis not present

## 2022-05-12 DIAGNOSIS — Z9181 History of falling: Secondary | ICD-10-CM | POA: Diagnosis not present

## 2022-05-12 DIAGNOSIS — G35 Multiple sclerosis: Secondary | ICD-10-CM | POA: Diagnosis not present

## 2022-05-12 DIAGNOSIS — Z466 Encounter for fitting and adjustment of urinary device: Secondary | ICD-10-CM | POA: Diagnosis not present

## 2022-05-12 DIAGNOSIS — I10 Essential (primary) hypertension: Secondary | ICD-10-CM | POA: Diagnosis not present

## 2022-05-12 DIAGNOSIS — Z6828 Body mass index (BMI) 28.0-28.9, adult: Secondary | ICD-10-CM | POA: Diagnosis not present

## 2022-05-12 DIAGNOSIS — E785 Hyperlipidemia, unspecified: Secondary | ICD-10-CM | POA: Diagnosis not present

## 2022-05-12 DIAGNOSIS — N200 Calculus of kidney: Secondary | ICD-10-CM | POA: Diagnosis not present

## 2022-05-12 DIAGNOSIS — E119 Type 2 diabetes mellitus without complications: Secondary | ICD-10-CM | POA: Diagnosis not present

## 2022-05-12 DIAGNOSIS — R339 Retention of urine, unspecified: Secondary | ICD-10-CM | POA: Diagnosis not present

## 2022-05-13 DIAGNOSIS — E785 Hyperlipidemia, unspecified: Secondary | ICD-10-CM | POA: Diagnosis not present

## 2022-05-13 DIAGNOSIS — I10 Essential (primary) hypertension: Secondary | ICD-10-CM | POA: Diagnosis not present

## 2022-05-13 DIAGNOSIS — R339 Retention of urine, unspecified: Secondary | ICD-10-CM | POA: Diagnosis not present

## 2022-05-13 DIAGNOSIS — Z466 Encounter for fitting and adjustment of urinary device: Secondary | ICD-10-CM | POA: Diagnosis not present

## 2022-05-13 DIAGNOSIS — E119 Type 2 diabetes mellitus without complications: Secondary | ICD-10-CM | POA: Diagnosis not present

## 2022-05-13 DIAGNOSIS — I471 Supraventricular tachycardia, unspecified: Secondary | ICD-10-CM | POA: Diagnosis not present

## 2022-05-13 DIAGNOSIS — N200 Calculus of kidney: Secondary | ICD-10-CM | POA: Diagnosis not present

## 2022-05-13 DIAGNOSIS — E876 Hypokalemia: Secondary | ICD-10-CM | POA: Diagnosis not present

## 2022-05-13 DIAGNOSIS — G35 Multiple sclerosis: Secondary | ICD-10-CM | POA: Diagnosis not present

## 2022-05-13 DIAGNOSIS — Z436 Encounter for attention to other artificial openings of urinary tract: Secondary | ICD-10-CM | POA: Diagnosis not present

## 2022-05-13 DIAGNOSIS — D649 Anemia, unspecified: Secondary | ICD-10-CM | POA: Diagnosis not present

## 2022-05-13 DIAGNOSIS — N390 Urinary tract infection, site not specified: Secondary | ICD-10-CM | POA: Diagnosis not present

## 2022-05-13 DIAGNOSIS — Z6828 Body mass index (BMI) 28.0-28.9, adult: Secondary | ICD-10-CM | POA: Diagnosis not present

## 2022-05-13 DIAGNOSIS — N3281 Overactive bladder: Secondary | ICD-10-CM | POA: Diagnosis not present

## 2022-05-13 DIAGNOSIS — Z8673 Personal history of transient ischemic attack (TIA), and cerebral infarction without residual deficits: Secondary | ICD-10-CM | POA: Diagnosis not present

## 2022-05-13 DIAGNOSIS — Z9181 History of falling: Secondary | ICD-10-CM | POA: Diagnosis not present

## 2022-05-18 DIAGNOSIS — Z436 Encounter for attention to other artificial openings of urinary tract: Secondary | ICD-10-CM | POA: Diagnosis not present

## 2022-05-18 DIAGNOSIS — E876 Hypokalemia: Secondary | ICD-10-CM | POA: Diagnosis not present

## 2022-05-18 DIAGNOSIS — R339 Retention of urine, unspecified: Secondary | ICD-10-CM | POA: Diagnosis not present

## 2022-05-18 DIAGNOSIS — Z8673 Personal history of transient ischemic attack (TIA), and cerebral infarction without residual deficits: Secondary | ICD-10-CM | POA: Diagnosis not present

## 2022-05-18 DIAGNOSIS — N200 Calculus of kidney: Secondary | ICD-10-CM | POA: Diagnosis not present

## 2022-05-18 DIAGNOSIS — Z6828 Body mass index (BMI) 28.0-28.9, adult: Secondary | ICD-10-CM | POA: Diagnosis not present

## 2022-05-18 DIAGNOSIS — N390 Urinary tract infection, site not specified: Secondary | ICD-10-CM | POA: Diagnosis not present

## 2022-05-18 DIAGNOSIS — Z9181 History of falling: Secondary | ICD-10-CM | POA: Diagnosis not present

## 2022-05-18 DIAGNOSIS — E119 Type 2 diabetes mellitus without complications: Secondary | ICD-10-CM | POA: Diagnosis not present

## 2022-05-18 DIAGNOSIS — I10 Essential (primary) hypertension: Secondary | ICD-10-CM | POA: Diagnosis not present

## 2022-05-18 DIAGNOSIS — I471 Supraventricular tachycardia, unspecified: Secondary | ICD-10-CM | POA: Diagnosis not present

## 2022-05-18 DIAGNOSIS — Z466 Encounter for fitting and adjustment of urinary device: Secondary | ICD-10-CM | POA: Diagnosis not present

## 2022-05-18 DIAGNOSIS — E785 Hyperlipidemia, unspecified: Secondary | ICD-10-CM | POA: Diagnosis not present

## 2022-05-18 DIAGNOSIS — N3281 Overactive bladder: Secondary | ICD-10-CM | POA: Diagnosis not present

## 2022-05-18 DIAGNOSIS — D649 Anemia, unspecified: Secondary | ICD-10-CM | POA: Diagnosis not present

## 2022-05-18 DIAGNOSIS — G35 Multiple sclerosis: Secondary | ICD-10-CM | POA: Diagnosis not present

## 2022-05-20 DIAGNOSIS — E119 Type 2 diabetes mellitus without complications: Secondary | ICD-10-CM | POA: Diagnosis not present

## 2022-05-20 DIAGNOSIS — E785 Hyperlipidemia, unspecified: Secondary | ICD-10-CM | POA: Diagnosis not present

## 2022-05-20 DIAGNOSIS — Z436 Encounter for attention to other artificial openings of urinary tract: Secondary | ICD-10-CM | POA: Diagnosis not present

## 2022-05-20 DIAGNOSIS — I471 Supraventricular tachycardia, unspecified: Secondary | ICD-10-CM | POA: Diagnosis not present

## 2022-05-20 DIAGNOSIS — R339 Retention of urine, unspecified: Secondary | ICD-10-CM | POA: Diagnosis not present

## 2022-05-20 DIAGNOSIS — N3281 Overactive bladder: Secondary | ICD-10-CM | POA: Diagnosis not present

## 2022-05-20 DIAGNOSIS — D649 Anemia, unspecified: Secondary | ICD-10-CM | POA: Diagnosis not present

## 2022-05-20 DIAGNOSIS — Z8673 Personal history of transient ischemic attack (TIA), and cerebral infarction without residual deficits: Secondary | ICD-10-CM | POA: Diagnosis not present

## 2022-05-20 DIAGNOSIS — N200 Calculus of kidney: Secondary | ICD-10-CM | POA: Diagnosis not present

## 2022-05-20 DIAGNOSIS — Z466 Encounter for fitting and adjustment of urinary device: Secondary | ICD-10-CM | POA: Diagnosis not present

## 2022-05-20 DIAGNOSIS — N390 Urinary tract infection, site not specified: Secondary | ICD-10-CM | POA: Diagnosis not present

## 2022-05-20 DIAGNOSIS — E876 Hypokalemia: Secondary | ICD-10-CM | POA: Diagnosis not present

## 2022-05-20 DIAGNOSIS — Z6828 Body mass index (BMI) 28.0-28.9, adult: Secondary | ICD-10-CM | POA: Diagnosis not present

## 2022-05-20 DIAGNOSIS — Z9181 History of falling: Secondary | ICD-10-CM | POA: Diagnosis not present

## 2022-05-20 DIAGNOSIS — G35 Multiple sclerosis: Secondary | ICD-10-CM | POA: Diagnosis not present

## 2022-05-20 DIAGNOSIS — I10 Essential (primary) hypertension: Secondary | ICD-10-CM | POA: Diagnosis not present

## 2022-05-25 DIAGNOSIS — G0400 Acute disseminated encephalitis and encephalomyelitis, unspecified: Secondary | ICD-10-CM | POA: Diagnosis not present

## 2022-05-25 DIAGNOSIS — R5381 Other malaise: Secondary | ICD-10-CM | POA: Diagnosis not present

## 2022-05-25 DIAGNOSIS — G8929 Other chronic pain: Secondary | ICD-10-CM | POA: Diagnosis not present

## 2022-05-25 DIAGNOSIS — J9611 Chronic respiratory failure with hypoxia: Secondary | ICD-10-CM | POA: Diagnosis not present

## 2022-05-25 DIAGNOSIS — M5440 Lumbago with sciatica, unspecified side: Secondary | ICD-10-CM | POA: Diagnosis not present

## 2022-05-27 ENCOUNTER — Other Ambulatory Visit (HOSPITAL_COMMUNITY): Payer: Self-pay | Admitting: Interventional Radiology

## 2022-05-27 ENCOUNTER — Ambulatory Visit (HOSPITAL_COMMUNITY)
Admission: RE | Admit: 2022-05-27 | Discharge: 2022-05-27 | Disposition: A | Discharge status: Home / Self Care | Payer: Medicare HMO | Source: Ambulatory Visit | Attending: Interventional Radiology | Admitting: Interventional Radiology

## 2022-05-27 DIAGNOSIS — K810 Acute cholecystitis: Secondary | ICD-10-CM

## 2022-05-27 DIAGNOSIS — Z434 Encounter for attention to other artificial openings of digestive tract: Secondary | ICD-10-CM | POA: Insufficient documentation

## 2022-05-27 DIAGNOSIS — M5440 Lumbago with sciatica, unspecified side: Secondary | ICD-10-CM | POA: Diagnosis not present

## 2022-05-27 DIAGNOSIS — G8929 Other chronic pain: Secondary | ICD-10-CM | POA: Diagnosis not present

## 2022-05-27 DIAGNOSIS — R5381 Other malaise: Secondary | ICD-10-CM | POA: Diagnosis not present

## 2022-05-27 DIAGNOSIS — G0400 Acute disseminated encephalitis and encephalomyelitis, unspecified: Secondary | ICD-10-CM | POA: Diagnosis not present

## 2022-05-27 DIAGNOSIS — J9611 Chronic respiratory failure with hypoxia: Secondary | ICD-10-CM | POA: Diagnosis not present

## 2022-05-27 HISTORY — PX: IR EXCHANGE BILIARY DRAIN: IMG6046

## 2022-05-27 MED ORDER — LIDOCAINE HCL 1 % IJ SOLN
INTRAMUSCULAR | Status: AC
  Filled 2022-05-27: qty 20

## 2022-05-27 MED ORDER — LIDOCAINE HCL (PF) 1 % IJ SOLN
10.0000 mL | Freq: Once | INTRAMUSCULAR | Status: AC
  Administered 2022-05-27: 10 mL

## 2022-05-27 MED ORDER — IOHEXOL 300 MG/ML  SOLN
50.0000 mL | Freq: Once | INTRAMUSCULAR | Status: AC | PRN
  Administered 2022-05-27: 15 mL

## 2022-06-03 DIAGNOSIS — Z8673 Personal history of transient ischemic attack (TIA), and cerebral infarction without residual deficits: Secondary | ICD-10-CM | POA: Diagnosis not present

## 2022-06-03 DIAGNOSIS — E119 Type 2 diabetes mellitus without complications: Secondary | ICD-10-CM | POA: Diagnosis not present

## 2022-06-03 DIAGNOSIS — G35 Multiple sclerosis: Secondary | ICD-10-CM | POA: Diagnosis not present

## 2022-06-03 DIAGNOSIS — Z9181 History of falling: Secondary | ICD-10-CM | POA: Diagnosis not present

## 2022-06-03 DIAGNOSIS — Z6828 Body mass index (BMI) 28.0-28.9, adult: Secondary | ICD-10-CM | POA: Diagnosis not present

## 2022-06-03 DIAGNOSIS — Z466 Encounter for fitting and adjustment of urinary device: Secondary | ICD-10-CM | POA: Diagnosis not present

## 2022-06-03 DIAGNOSIS — E876 Hypokalemia: Secondary | ICD-10-CM | POA: Diagnosis not present

## 2022-06-03 DIAGNOSIS — N3281 Overactive bladder: Secondary | ICD-10-CM | POA: Diagnosis not present

## 2022-06-03 DIAGNOSIS — N390 Urinary tract infection, site not specified: Secondary | ICD-10-CM | POA: Diagnosis not present

## 2022-06-03 DIAGNOSIS — Z436 Encounter for attention to other artificial openings of urinary tract: Secondary | ICD-10-CM | POA: Diagnosis not present

## 2022-06-03 DIAGNOSIS — E785 Hyperlipidemia, unspecified: Secondary | ICD-10-CM | POA: Diagnosis not present

## 2022-06-03 DIAGNOSIS — R339 Retention of urine, unspecified: Secondary | ICD-10-CM | POA: Diagnosis not present

## 2022-06-03 DIAGNOSIS — I471 Supraventricular tachycardia, unspecified: Secondary | ICD-10-CM | POA: Diagnosis not present

## 2022-06-03 DIAGNOSIS — N200 Calculus of kidney: Secondary | ICD-10-CM | POA: Diagnosis not present

## 2022-06-03 DIAGNOSIS — D649 Anemia, unspecified: Secondary | ICD-10-CM | POA: Diagnosis not present

## 2022-06-03 DIAGNOSIS — I10 Essential (primary) hypertension: Secondary | ICD-10-CM | POA: Diagnosis not present

## 2022-06-17 DIAGNOSIS — N390 Urinary tract infection, site not specified: Secondary | ICD-10-CM | POA: Diagnosis not present

## 2022-06-17 DIAGNOSIS — E119 Type 2 diabetes mellitus without complications: Secondary | ICD-10-CM | POA: Diagnosis not present

## 2022-06-17 DIAGNOSIS — Z9181 History of falling: Secondary | ICD-10-CM | POA: Diagnosis not present

## 2022-06-17 DIAGNOSIS — E785 Hyperlipidemia, unspecified: Secondary | ICD-10-CM | POA: Diagnosis not present

## 2022-06-17 DIAGNOSIS — E876 Hypokalemia: Secondary | ICD-10-CM | POA: Diagnosis not present

## 2022-06-17 DIAGNOSIS — R339 Retention of urine, unspecified: Secondary | ICD-10-CM | POA: Diagnosis not present

## 2022-06-17 DIAGNOSIS — I471 Supraventricular tachycardia, unspecified: Secondary | ICD-10-CM | POA: Diagnosis not present

## 2022-06-17 DIAGNOSIS — N3281 Overactive bladder: Secondary | ICD-10-CM | POA: Diagnosis not present

## 2022-06-17 DIAGNOSIS — Z466 Encounter for fitting and adjustment of urinary device: Secondary | ICD-10-CM | POA: Diagnosis not present

## 2022-06-17 DIAGNOSIS — Z436 Encounter for attention to other artificial openings of urinary tract: Secondary | ICD-10-CM | POA: Diagnosis not present

## 2022-06-17 DIAGNOSIS — G35 Multiple sclerosis: Secondary | ICD-10-CM | POA: Diagnosis not present

## 2022-06-17 DIAGNOSIS — N200 Calculus of kidney: Secondary | ICD-10-CM | POA: Diagnosis not present

## 2022-06-17 DIAGNOSIS — I10 Essential (primary) hypertension: Secondary | ICD-10-CM | POA: Diagnosis not present

## 2022-06-17 DIAGNOSIS — Z6828 Body mass index (BMI) 28.0-28.9, adult: Secondary | ICD-10-CM | POA: Diagnosis not present

## 2022-06-17 DIAGNOSIS — D649 Anemia, unspecified: Secondary | ICD-10-CM | POA: Diagnosis not present

## 2022-06-17 DIAGNOSIS — Z8673 Personal history of transient ischemic attack (TIA), and cerebral infarction without residual deficits: Secondary | ICD-10-CM | POA: Diagnosis not present

## 2022-06-22 DIAGNOSIS — G35 Multiple sclerosis: Secondary | ICD-10-CM | POA: Diagnosis not present

## 2022-06-22 DIAGNOSIS — I471 Supraventricular tachycardia, unspecified: Secondary | ICD-10-CM | POA: Diagnosis not present

## 2022-06-22 DIAGNOSIS — Z9181 History of falling: Secondary | ICD-10-CM | POA: Diagnosis not present

## 2022-06-22 DIAGNOSIS — Z466 Encounter for fitting and adjustment of urinary device: Secondary | ICD-10-CM | POA: Diagnosis not present

## 2022-06-22 DIAGNOSIS — D649 Anemia, unspecified: Secondary | ICD-10-CM | POA: Diagnosis not present

## 2022-06-22 DIAGNOSIS — N200 Calculus of kidney: Secondary | ICD-10-CM | POA: Diagnosis not present

## 2022-06-22 DIAGNOSIS — E876 Hypokalemia: Secondary | ICD-10-CM | POA: Diagnosis not present

## 2022-06-22 DIAGNOSIS — E119 Type 2 diabetes mellitus without complications: Secondary | ICD-10-CM | POA: Diagnosis not present

## 2022-06-22 DIAGNOSIS — Z436 Encounter for attention to other artificial openings of urinary tract: Secondary | ICD-10-CM | POA: Diagnosis not present

## 2022-06-22 DIAGNOSIS — N390 Urinary tract infection, site not specified: Secondary | ICD-10-CM | POA: Diagnosis not present

## 2022-06-22 DIAGNOSIS — N3281 Overactive bladder: Secondary | ICD-10-CM | POA: Diagnosis not present

## 2022-06-22 DIAGNOSIS — Z6828 Body mass index (BMI) 28.0-28.9, adult: Secondary | ICD-10-CM | POA: Diagnosis not present

## 2022-06-22 DIAGNOSIS — R339 Retention of urine, unspecified: Secondary | ICD-10-CM | POA: Diagnosis not present

## 2022-06-22 DIAGNOSIS — I10 Essential (primary) hypertension: Secondary | ICD-10-CM | POA: Diagnosis not present

## 2022-06-22 DIAGNOSIS — Z8673 Personal history of transient ischemic attack (TIA), and cerebral infarction without residual deficits: Secondary | ICD-10-CM | POA: Diagnosis not present

## 2022-06-22 DIAGNOSIS — E785 Hyperlipidemia, unspecified: Secondary | ICD-10-CM | POA: Diagnosis not present

## 2022-06-25 DIAGNOSIS — R5381 Other malaise: Secondary | ICD-10-CM | POA: Diagnosis not present

## 2022-06-25 DIAGNOSIS — M5440 Lumbago with sciatica, unspecified side: Secondary | ICD-10-CM | POA: Diagnosis not present

## 2022-06-25 DIAGNOSIS — J9611 Chronic respiratory failure with hypoxia: Secondary | ICD-10-CM | POA: Diagnosis not present

## 2022-06-25 DIAGNOSIS — G0400 Acute disseminated encephalitis and encephalomyelitis, unspecified: Secondary | ICD-10-CM | POA: Diagnosis not present

## 2022-06-25 DIAGNOSIS — G8929 Other chronic pain: Secondary | ICD-10-CM | POA: Diagnosis not present

## 2022-06-27 DIAGNOSIS — J9611 Chronic respiratory failure with hypoxia: Secondary | ICD-10-CM | POA: Diagnosis not present

## 2022-06-27 DIAGNOSIS — R5381 Other malaise: Secondary | ICD-10-CM | POA: Diagnosis not present

## 2022-06-27 DIAGNOSIS — G8929 Other chronic pain: Secondary | ICD-10-CM | POA: Diagnosis not present

## 2022-06-27 DIAGNOSIS — G0400 Acute disseminated encephalitis and encephalomyelitis, unspecified: Secondary | ICD-10-CM | POA: Diagnosis not present

## 2022-06-27 DIAGNOSIS — M5440 Lumbago with sciatica, unspecified side: Secondary | ICD-10-CM | POA: Diagnosis not present

## 2022-07-01 DIAGNOSIS — I10 Essential (primary) hypertension: Secondary | ICD-10-CM | POA: Diagnosis not present

## 2022-07-01 DIAGNOSIS — E876 Hypokalemia: Secondary | ICD-10-CM | POA: Diagnosis not present

## 2022-07-01 DIAGNOSIS — E119 Type 2 diabetes mellitus without complications: Secondary | ICD-10-CM | POA: Diagnosis not present

## 2022-07-01 DIAGNOSIS — I471 Supraventricular tachycardia, unspecified: Secondary | ICD-10-CM | POA: Diagnosis not present

## 2022-07-01 DIAGNOSIS — Z436 Encounter for attention to other artificial openings of urinary tract: Secondary | ICD-10-CM | POA: Diagnosis not present

## 2022-07-01 DIAGNOSIS — G35 Multiple sclerosis: Secondary | ICD-10-CM | POA: Diagnosis not present

## 2022-07-01 DIAGNOSIS — Z466 Encounter for fitting and adjustment of urinary device: Secondary | ICD-10-CM | POA: Diagnosis not present

## 2022-07-01 DIAGNOSIS — D649 Anemia, unspecified: Secondary | ICD-10-CM | POA: Diagnosis not present

## 2022-07-01 DIAGNOSIS — N390 Urinary tract infection, site not specified: Secondary | ICD-10-CM | POA: Diagnosis not present

## 2022-07-21 DIAGNOSIS — I471 Supraventricular tachycardia, unspecified: Secondary | ICD-10-CM | POA: Diagnosis not present

## 2022-07-21 DIAGNOSIS — N390 Urinary tract infection, site not specified: Secondary | ICD-10-CM | POA: Diagnosis not present

## 2022-07-21 DIAGNOSIS — I10 Essential (primary) hypertension: Secondary | ICD-10-CM | POA: Diagnosis not present

## 2022-07-21 DIAGNOSIS — G35 Multiple sclerosis: Secondary | ICD-10-CM | POA: Diagnosis not present

## 2022-07-21 DIAGNOSIS — Z466 Encounter for fitting and adjustment of urinary device: Secondary | ICD-10-CM | POA: Diagnosis not present

## 2022-07-21 DIAGNOSIS — E119 Type 2 diabetes mellitus without complications: Secondary | ICD-10-CM | POA: Diagnosis not present

## 2022-07-21 DIAGNOSIS — E782 Mixed hyperlipidemia: Secondary | ICD-10-CM | POA: Diagnosis not present

## 2022-07-21 DIAGNOSIS — D649 Anemia, unspecified: Secondary | ICD-10-CM | POA: Diagnosis not present

## 2022-07-21 DIAGNOSIS — Z436 Encounter for attention to other artificial openings of urinary tract: Secondary | ICD-10-CM | POA: Diagnosis not present

## 2022-07-21 DIAGNOSIS — E876 Hypokalemia: Secondary | ICD-10-CM | POA: Diagnosis not present

## 2022-07-22 ENCOUNTER — Encounter (HOSPITAL_COMMUNITY): Payer: Self-pay

## 2022-07-22 ENCOUNTER — Other Ambulatory Visit (HOSPITAL_COMMUNITY): Payer: Medicare HMO

## 2022-07-22 ENCOUNTER — Inpatient Hospital Stay (HOSPITAL_COMMUNITY): Admission: RE | Admit: 2022-07-22 | Payer: Medicare HMO | Source: Ambulatory Visit

## 2022-07-22 ENCOUNTER — Other Ambulatory Visit (HOSPITAL_COMMUNITY): Payer: Self-pay | Admitting: Interventional Radiology

## 2022-07-22 DIAGNOSIS — K810 Acute cholecystitis: Secondary | ICD-10-CM

## 2022-07-24 ENCOUNTER — Other Ambulatory Visit (HOSPITAL_COMMUNITY): Payer: Self-pay | Admitting: Interventional Radiology

## 2022-07-24 ENCOUNTER — Ambulatory Visit (HOSPITAL_COMMUNITY)
Admission: RE | Admit: 2022-07-24 | Discharge: 2022-07-24 | Disposition: A | Discharge status: Home / Self Care | Payer: Medicare HMO | Source: Ambulatory Visit | Attending: Interventional Radiology | Admitting: Interventional Radiology

## 2022-07-24 DIAGNOSIS — Z434 Encounter for attention to other artificial openings of digestive tract: Secondary | ICD-10-CM | POA: Diagnosis not present

## 2022-07-24 DIAGNOSIS — K819 Cholecystitis, unspecified: Secondary | ICD-10-CM

## 2022-07-24 DIAGNOSIS — K810 Acute cholecystitis: Secondary | ICD-10-CM | POA: Diagnosis not present

## 2022-07-24 HISTORY — PX: IR EXCHANGE BILIARY DRAIN: IMG6046

## 2022-07-24 MED ORDER — IOHEXOL 300 MG/ML  SOLN
50.0000 mL | Freq: Once | INTRAMUSCULAR | Status: AC | PRN
  Administered 2022-07-24: 10 mL

## 2022-07-24 NOTE — Procedures (Signed)
Pre procedural Dx: Acute Cholelithiasis, poor operative candidate. Post procedural Dx: Same  Successful Fluoro guided exchange of chronic chole tube. Chole tube connected to gravity bag.   EBL: Trace  Complications: None immediate  Katherina Right, MD Pager #: [phone]

## 2022-07-25 DIAGNOSIS — J9611 Chronic respiratory failure with hypoxia: Secondary | ICD-10-CM | POA: Diagnosis not present

## 2022-07-25 DIAGNOSIS — G0400 Acute disseminated encephalitis and encephalomyelitis, unspecified: Secondary | ICD-10-CM | POA: Diagnosis not present

## 2022-07-25 DIAGNOSIS — G8929 Other chronic pain: Secondary | ICD-10-CM | POA: Diagnosis not present

## 2022-07-25 DIAGNOSIS — M5440 Lumbago with sciatica, unspecified side: Secondary | ICD-10-CM | POA: Diagnosis not present

## 2022-07-25 DIAGNOSIS — R5381 Other malaise: Secondary | ICD-10-CM | POA: Diagnosis not present

## 2022-07-27 DIAGNOSIS — G0400 Acute disseminated encephalitis and encephalomyelitis, unspecified: Secondary | ICD-10-CM | POA: Diagnosis not present

## 2022-07-27 DIAGNOSIS — E861 Hypovolemia: Secondary | ICD-10-CM | POA: Diagnosis not present

## 2022-07-27 DIAGNOSIS — J9611 Chronic respiratory failure with hypoxia: Secondary | ICD-10-CM | POA: Diagnosis not present

## 2022-07-27 DIAGNOSIS — E876 Hypokalemia: Secondary | ICD-10-CM | POA: Diagnosis not present

## 2022-07-27 DIAGNOSIS — N200 Calculus of kidney: Secondary | ICD-10-CM | POA: Diagnosis not present

## 2022-07-27 DIAGNOSIS — L270 Generalized skin eruption due to drugs and medicaments taken internally: Secondary | ICD-10-CM | POA: Diagnosis not present

## 2022-07-27 DIAGNOSIS — G8929 Other chronic pain: Secondary | ICD-10-CM | POA: Diagnosis not present

## 2022-07-27 DIAGNOSIS — K529 Noninfective gastroenteritis and colitis, unspecified: Secondary | ICD-10-CM | POA: Diagnosis not present

## 2022-07-27 DIAGNOSIS — K922 Gastrointestinal hemorrhage, unspecified: Secondary | ICD-10-CM | POA: Diagnosis not present

## 2022-07-27 DIAGNOSIS — R5381 Other malaise: Secondary | ICD-10-CM | POA: Diagnosis not present

## 2022-07-27 DIAGNOSIS — Z960 Presence of urogenital implants: Secondary | ICD-10-CM | POA: Diagnosis not present

## 2022-07-27 DIAGNOSIS — I10 Essential (primary) hypertension: Secondary | ICD-10-CM | POA: Diagnosis not present

## 2022-07-27 DIAGNOSIS — A4150 Gram-negative sepsis, unspecified: Secondary | ICD-10-CM | POA: Diagnosis not present

## 2022-07-27 DIAGNOSIS — E1165 Type 2 diabetes mellitus with hyperglycemia: Secondary | ICD-10-CM | POA: Diagnosis not present

## 2022-07-27 DIAGNOSIS — M5440 Lumbago with sciatica, unspecified side: Secondary | ICD-10-CM | POA: Diagnosis not present

## 2022-07-27 DIAGNOSIS — K819 Cholecystitis, unspecified: Secondary | ICD-10-CM | POA: Diagnosis not present

## 2022-07-29 DIAGNOSIS — N200 Calculus of kidney: Secondary | ICD-10-CM | POA: Diagnosis not present

## 2022-07-29 DIAGNOSIS — E876 Hypokalemia: Secondary | ICD-10-CM | POA: Diagnosis not present

## 2022-07-29 DIAGNOSIS — E785 Hyperlipidemia, unspecified: Secondary | ICD-10-CM | POA: Diagnosis not present

## 2022-07-29 DIAGNOSIS — Z8673 Personal history of transient ischemic attack (TIA), and cerebral infarction without residual deficits: Secondary | ICD-10-CM | POA: Diagnosis not present

## 2022-07-29 DIAGNOSIS — Z466 Encounter for fitting and adjustment of urinary device: Secondary | ICD-10-CM | POA: Diagnosis not present

## 2022-07-29 DIAGNOSIS — Z6828 Body mass index (BMI) 28.0-28.9, adult: Secondary | ICD-10-CM | POA: Diagnosis not present

## 2022-07-29 DIAGNOSIS — E119 Type 2 diabetes mellitus without complications: Secondary | ICD-10-CM | POA: Diagnosis not present

## 2022-07-29 DIAGNOSIS — R339 Retention of urine, unspecified: Secondary | ICD-10-CM | POA: Diagnosis not present

## 2022-07-29 DIAGNOSIS — Z9181 History of falling: Secondary | ICD-10-CM | POA: Diagnosis not present

## 2022-07-29 DIAGNOSIS — N3281 Overactive bladder: Secondary | ICD-10-CM | POA: Diagnosis not present

## 2022-07-29 DIAGNOSIS — D649 Anemia, unspecified: Secondary | ICD-10-CM | POA: Diagnosis not present

## 2022-07-29 DIAGNOSIS — I471 Supraventricular tachycardia, unspecified: Secondary | ICD-10-CM | POA: Diagnosis not present

## 2022-07-29 DIAGNOSIS — Z436 Encounter for attention to other artificial openings of urinary tract: Secondary | ICD-10-CM | POA: Diagnosis not present

## 2022-07-29 DIAGNOSIS — N390 Urinary tract infection, site not specified: Secondary | ICD-10-CM | POA: Diagnosis not present

## 2022-07-29 DIAGNOSIS — I10 Essential (primary) hypertension: Secondary | ICD-10-CM | POA: Diagnosis not present

## 2022-07-29 DIAGNOSIS — G35 Multiple sclerosis: Secondary | ICD-10-CM | POA: Diagnosis not present

## 2022-08-11 ENCOUNTER — Encounter: Payer: Self-pay | Admitting: Urology

## 2022-08-11 ENCOUNTER — Ambulatory Visit: Payer: Medicare HMO | Admitting: Urology

## 2022-08-11 VITALS — BP 117/80 | HR 81

## 2022-08-11 DIAGNOSIS — Z87442 Personal history of urinary calculi: Secondary | ICD-10-CM

## 2022-08-11 DIAGNOSIS — N201 Calculus of ureter: Secondary | ICD-10-CM

## 2022-08-11 DIAGNOSIS — R339 Retention of urine, unspecified: Secondary | ICD-10-CM | POA: Diagnosis not present

## 2022-08-11 DIAGNOSIS — N138 Other obstructive and reflux uropathy: Secondary | ICD-10-CM

## 2022-08-11 NOTE — Progress Notes (Signed)
History of Present Illness: 62 year old male here for follow-up.  Initially seen by me in December 2023.  The patient had acute urinary retention.  He was admitted to the hospital prior to that appointment with a GI bleed, encephalitis and examined his cholecystitis.  When I saw him, he was still significantly debilitated.  It was felt best to leave him with a catheter at least in the short-term  He underwent ureteroscopic management of a left UVJ stone by Dr. Devoria Glassing on 03/30/2022.  His stent was subsequently removed by me at his last visit here in April.  8.6.2024: For recheck.  Has his catheter changed every month.  His biliary tube is changed every couple of months.  He has not been treated for recent urinary tract infection.  He does have his urine sent off once in a while for culture at the behest of his home health care nurse.  No improvement in neurologic situation.  Past Medical History:  Diagnosis Date   Anemia    Arthritis    CVA (cerebral vascular accident)    last CVA 04/2021   Diabetes mellitus without complication    type 2   Hyperlipidemia    Hypertension    Kidney stones     Past Surgical History:  Procedure Laterality Date   ANKLE SURGERY     BIOPSY  09/10/2021   Procedure: BIOPSY;  Surgeon: Jeani Hawking, MD;  Location: [facility] ENDOSCOPY;  Service: Gastroenterology;;   BIOPSY  10/07/2021   Procedure: BIOPSY;  Surgeon: Jeani Hawking, MD;  Location: [facility] ENDOSCOPY;  Service: Gastroenterology;;   COLONOSCOPY N/A 10/07/2021   Procedure: COLONOSCOPY;  Surgeon: Jeani Hawking, MD;  Location: [facility] ENDOSCOPY;  Service: Gastroenterology;  Laterality: N/A;   COLONOSCOPY WITH PROPOFOL N/A 09/06/2021   Procedure: COLONOSCOPY WITH PROPOFOL;  Surgeon: Dolores Frame, MD;  Location: [facility];  Service: Gastroenterology;  Laterality: N/A;   CYSTOSCOPY/URETEROSCOPY/HOLMIUM LASER/STENT PLACEMENT Left 03/30/2022   Procedure: CYSTOSCOPY/RETROGRADE/URETEROSCOPY/HOLMIUM  LASER/STENT PLACEMENT;  Surgeon: Rene Paci, MD;  Location: [facility];  Service: Urology;  Laterality: Left;   ENTEROSCOPY N/A 09/10/2021   Procedure: ENTEROSCOPY;  Surgeon: Jeani Hawking, MD;  Location: [facility] ENDOSCOPY;  Service: Gastroenterology;  Laterality: N/A;   ENTEROSCOPY  09/06/2021   Procedure: ENTEROSCOPY;  Surgeon: Dolores Frame, MD;  [facility];  Service: Gastroenterology;;   ESOPHAGOGASTRODUODENOSCOPY (EGD) WITH PROPOFOL  09/06/2021   Procedure: ESOPHAGOGASTRODUODENOSCOPY (EGD) WITH PROPOFOL;  Surgeon: Dolores Frame, MD;  [facility];  Service: Gastroenterology;;   Wenda Low SIGMOIDOSCOPY N/A 09/19/2021   Procedure: Arnell Sieving;  Surgeon: Jeani Hawking, MD;  Location: [facility] ENDOSCOPY;  Service: Gastroenterology;  Laterality: N/A;   FOREIGN BODY REMOVAL  09/19/2021   Procedure: FOREIGN BODY REMOVAL;  Surgeon: Jeani Hawking, MD;  Location: [facility] ENDOSCOPY;  Service: Gastroenterology;;   Emelda Brothers CAPSULE STUDY  09/06/2021   Procedure: GIVENS CAPSULE STUDY;  Surgeon: Dolores Frame, MD;  Location: [facility];  Service: Gastroenterology;;   HERNIA REPAIR     umbilical x1 Incisional x1   INCISIONAL HERNIA REPAIR  04/13/2011   Procedure: LAPAROSCOPIC INCISIONAL HERNIA;  Surgeon: Dalia Heading, MD;  Location: [facility];  Service: General;  Laterality: N/A;  Recurrent Laparoscopic Incisional Herniorraphy with Mesh   IR ANGIOGRAM SELECTIVE EACH ADDITIONAL VESSEL  09/09/2021   IR ANGIOGRAM VISCERAL SELECTIVE  09/07/2021   IR ANGIOGRAM VISCERAL SELECTIVE  09/06/2021   IR ANGIOGRAM VISCERAL SELECTIVE  09/06/2021   IR CHOLANGIOGRAM EXISTING TUBE  11/06/2021  IR EMBO ART  VEN HEMORR LYMPH EXTRAV  INC GUIDE ROADMAPPING  09/06/2021   IR EXCHANGE BILIARY DRAIN  10/24/2021   IR EXCHANGE BILIARY DRAIN  11/19/2021   IR EXCHANGE BILIARY DRAIN  01/19/2022   IR EXCHANGE BILIARY DRAIN  02/19/2022   IR EXCHANGE BILIARY DRAIN  04/01/2022   IR GUIDED DRAIN  W CATHETER PLACEMENT  09/07/2021   IR US GUIDE BX ASP/DRAIN  09/07/2021   IR US GUIDE VASC ACCESS RIGHT  09/07/2021   IR US GUIDE VASC ACCESS RIGHT  09/06/2021   KIDNEY STONE SURGERY     RADIOLOGY WITH ANESTHESIA N/A 03/10/2022   Procedure: MRI WITH ANESTHESIA OF BRAIN WITH AND WITHOUT CONTRAST;  Surgeon: Radiologist, Medication, MD;  Location: [facility] OR;  Service: Radiology;  Laterality: N/A;    Home Medications:  Allergies as of 04/21/2022       Reactions   Bactrim [sulfamethoxazole-trimethoprim] Rash   Amoxicillin Hives   Tolerated Cefepime        Medication List        Accurate as of April 21, 2022  8:59 AM. If you have any questions, ask your nurse or doctor.          BD Veo Insulin Syringe U/F 31G X 15/64" 1 ML Misc Generic drug: Insulin Syringe-Needle U-100 USE AS DIRECTED   glucose blood test strip Commonly known as: OneTouch Verio TEST twice a day   Insulin Pen Needle 32G X 5 MM Misc Commonly known as: NovoTwist Use two daily to inject Victoza and Toujeo.   methocarbamol 500 MG tablet Commonly known as: ROBAXIN Take 1 tablet (500 mg total) by mouth every 6 (six) hours as needed for muscle spasms.   metoprolol succinate 25 MG 24 hr tablet Commonly known as: TOPROL-XL Take 0.5 tablets (12.5 mg total) by mouth daily.   OneTouch Delica Lancets 33G Misc Use to check blood sugar once a day dx code E11.65   pantoprazole 40 MG tablet Commonly known as: PROTONIX Take 1 tablet (40 mg total) by mouth daily. What changed: when to take this   potassium chloride SA 20 MEQ tablet Commonly known as: KLOR-CON M Take 2 tablets (40 mEq total) by mouth 2 (two) times daily.   PROBIOTIC PO Take 8.5 mg by mouth daily.   rosuvastatin 40 MG tablet Commonly known as: Crestor Take 1 tablet (40 mg total) by mouth daily.   traZODone 50 MG tablet Commonly known as: DESYREL Take 0.5-1 tablets (25-50 mg total) by mouth at bedtime as needed for sleep. What changed:  how much to  take when to take this   Tresiba FlexTouch 200 UNIT/ML FlexTouch Pen Generic drug: insulin degludec Inject under skin 15 units twice a day. What changed:  how much to take how to take this when to take this additional instructions   TYLENOL 500 MG tablet Generic drug: acetaminophen Take 1 tablet (500 mg total) by mouth every 6 (six) hours as needed for mild pain or headache.        Allergies:  Allergies  Allergen Reactions   Bactrim [Sulfamethoxazole-Trimethoprim] Rash   Amoxicillin Hives    Tolerated Cefepime    Family History  Problem Relation Age of Onset   Heart failure Mother    Diabetes Father    Cancer Other    Heart attack Other    Anesthesia problems Neg Hx    Hypotension Neg Hx    Malignant hyperthermia Neg Hx    Pseudochol deficiency Neg Hx  Colon cancer Neg Hx    Inflammatory bowel disease Neg Hx     Social History:  reports that he has never smoked. He has quit using smokeless tobacco.  His smokeless tobacco use included snuff. He reports that he does not drink alcohol and does not use drugs.  ROS: A complete review of systems was performed.  All systems are negative except for pertinent findings as noted.  Physical Exam:  Vital signs in last 24 hours: There were no vitals taken for this visit. Constitutional:  Alert and oriented, No acute distress Cardiovascular: Regular rate  Respiratory: Normal respiratory effort Neurologic: Grossly intact, no focal deficits Psychiatric: Normal mood and affect  I have reviewed prior pt notes  I have reviewed notes from referring/previous physicians  I have independently reviewed prior imaging--recent CT images  I have reviewed prior urine culture   Impression/Assessment:  Incomplete bladder emptying, indwelling catheter present.  He does have history of catastrophic neurologic event.  He is still not very functional.  History of ureteral stone, status post ureteroscopic management of left distal  stone and March.  Stent removed at last visit   Plan:  At this point, I do not think he needs close follow-up..  I will arrange for telehealth visit in 6 months

## 2022-08-17 DIAGNOSIS — R339 Retention of urine, unspecified: Secondary | ICD-10-CM | POA: Diagnosis not present

## 2022-08-17 DIAGNOSIS — E119 Type 2 diabetes mellitus without complications: Secondary | ICD-10-CM | POA: Diagnosis not present

## 2022-08-17 DIAGNOSIS — Z436 Encounter for attention to other artificial openings of urinary tract: Secondary | ICD-10-CM | POA: Diagnosis not present

## 2022-08-17 DIAGNOSIS — N200 Calculus of kidney: Secondary | ICD-10-CM | POA: Diagnosis not present

## 2022-08-17 DIAGNOSIS — Z8673 Personal history of transient ischemic attack (TIA), and cerebral infarction without residual deficits: Secondary | ICD-10-CM | POA: Diagnosis not present

## 2022-08-17 DIAGNOSIS — D649 Anemia, unspecified: Secondary | ICD-10-CM | POA: Diagnosis not present

## 2022-08-17 DIAGNOSIS — G35 Multiple sclerosis: Secondary | ICD-10-CM | POA: Diagnosis not present

## 2022-08-17 DIAGNOSIS — Z6828 Body mass index (BMI) 28.0-28.9, adult: Secondary | ICD-10-CM | POA: Diagnosis not present

## 2022-08-17 DIAGNOSIS — I10 Essential (primary) hypertension: Secondary | ICD-10-CM | POA: Diagnosis not present

## 2022-08-17 DIAGNOSIS — N390 Urinary tract infection, site not specified: Secondary | ICD-10-CM | POA: Diagnosis not present

## 2022-08-17 DIAGNOSIS — E876 Hypokalemia: Secondary | ICD-10-CM | POA: Diagnosis not present

## 2022-08-17 DIAGNOSIS — Z466 Encounter for fitting and adjustment of urinary device: Secondary | ICD-10-CM | POA: Diagnosis not present

## 2022-08-17 DIAGNOSIS — N3281 Overactive bladder: Secondary | ICD-10-CM | POA: Diagnosis not present

## 2022-08-17 DIAGNOSIS — Z9181 History of falling: Secondary | ICD-10-CM | POA: Diagnosis not present

## 2022-08-17 DIAGNOSIS — E785 Hyperlipidemia, unspecified: Secondary | ICD-10-CM | POA: Diagnosis not present

## 2022-08-17 DIAGNOSIS — I471 Supraventricular tachycardia, unspecified: Secondary | ICD-10-CM | POA: Diagnosis not present

## 2022-08-24 DIAGNOSIS — D649 Anemia, unspecified: Secondary | ICD-10-CM | POA: Diagnosis not present

## 2022-08-24 DIAGNOSIS — E119 Type 2 diabetes mellitus without complications: Secondary | ICD-10-CM | POA: Diagnosis not present

## 2022-08-24 DIAGNOSIS — I10 Essential (primary) hypertension: Secondary | ICD-10-CM | POA: Diagnosis not present

## 2022-08-24 DIAGNOSIS — N3281 Overactive bladder: Secondary | ICD-10-CM | POA: Diagnosis not present

## 2022-08-24 DIAGNOSIS — N390 Urinary tract infection, site not specified: Secondary | ICD-10-CM | POA: Diagnosis not present

## 2022-08-24 DIAGNOSIS — G35 Multiple sclerosis: Secondary | ICD-10-CM | POA: Diagnosis not present

## 2022-08-24 DIAGNOSIS — N200 Calculus of kidney: Secondary | ICD-10-CM | POA: Diagnosis not present

## 2022-08-24 DIAGNOSIS — E785 Hyperlipidemia, unspecified: Secondary | ICD-10-CM | POA: Diagnosis not present

## 2022-08-24 DIAGNOSIS — E876 Hypokalemia: Secondary | ICD-10-CM | POA: Diagnosis not present

## 2022-08-24 DIAGNOSIS — I471 Supraventricular tachycardia, unspecified: Secondary | ICD-10-CM | POA: Diagnosis not present

## 2022-08-24 DIAGNOSIS — R339 Retention of urine, unspecified: Secondary | ICD-10-CM | POA: Diagnosis not present

## 2022-08-25 DIAGNOSIS — G0400 Acute disseminated encephalitis and encephalomyelitis, unspecified: Secondary | ICD-10-CM | POA: Diagnosis not present

## 2022-08-25 DIAGNOSIS — G8929 Other chronic pain: Secondary | ICD-10-CM | POA: Diagnosis not present

## 2022-08-25 DIAGNOSIS — J9611 Chronic respiratory failure with hypoxia: Secondary | ICD-10-CM | POA: Diagnosis not present

## 2022-08-25 DIAGNOSIS — R5381 Other malaise: Secondary | ICD-10-CM | POA: Diagnosis not present

## 2022-08-25 DIAGNOSIS — M5440 Lumbago with sciatica, unspecified side: Secondary | ICD-10-CM | POA: Diagnosis not present

## 2022-08-27 DIAGNOSIS — J9611 Chronic respiratory failure with hypoxia: Secondary | ICD-10-CM | POA: Diagnosis not present

## 2022-08-27 DIAGNOSIS — G0400 Acute disseminated encephalitis and encephalomyelitis, unspecified: Secondary | ICD-10-CM | POA: Diagnosis not present

## 2022-08-27 DIAGNOSIS — R5381 Other malaise: Secondary | ICD-10-CM | POA: Diagnosis not present

## 2022-08-27 DIAGNOSIS — G8929 Other chronic pain: Secondary | ICD-10-CM | POA: Diagnosis not present

## 2022-08-27 DIAGNOSIS — M5440 Lumbago with sciatica, unspecified side: Secondary | ICD-10-CM | POA: Diagnosis not present

## 2022-09-03 DIAGNOSIS — E876 Hypokalemia: Secondary | ICD-10-CM | POA: Diagnosis not present

## 2022-09-03 DIAGNOSIS — N390 Urinary tract infection, site not specified: Secondary | ICD-10-CM | POA: Diagnosis not present

## 2022-09-03 DIAGNOSIS — I10 Essential (primary) hypertension: Secondary | ICD-10-CM | POA: Diagnosis not present

## 2022-09-03 DIAGNOSIS — E785 Hyperlipidemia, unspecified: Secondary | ICD-10-CM | POA: Diagnosis not present

## 2022-09-03 DIAGNOSIS — I471 Supraventricular tachycardia, unspecified: Secondary | ICD-10-CM | POA: Diagnosis not present

## 2022-09-03 DIAGNOSIS — N200 Calculus of kidney: Secondary | ICD-10-CM | POA: Diagnosis not present

## 2022-09-03 DIAGNOSIS — R339 Retention of urine, unspecified: Secondary | ICD-10-CM | POA: Diagnosis not present

## 2022-09-03 DIAGNOSIS — N3281 Overactive bladder: Secondary | ICD-10-CM | POA: Diagnosis not present

## 2022-09-03 DIAGNOSIS — G35 Multiple sclerosis: Secondary | ICD-10-CM | POA: Diagnosis not present

## 2022-09-03 DIAGNOSIS — D649 Anemia, unspecified: Secondary | ICD-10-CM | POA: Diagnosis not present

## 2022-09-03 DIAGNOSIS — E119 Type 2 diabetes mellitus without complications: Secondary | ICD-10-CM | POA: Diagnosis not present

## 2022-09-17 DIAGNOSIS — N3281 Overactive bladder: Secondary | ICD-10-CM | POA: Diagnosis not present

## 2022-09-17 DIAGNOSIS — D649 Anemia, unspecified: Secondary | ICD-10-CM | POA: Diagnosis not present

## 2022-09-17 DIAGNOSIS — E119 Type 2 diabetes mellitus without complications: Secondary | ICD-10-CM | POA: Diagnosis not present

## 2022-09-17 DIAGNOSIS — E876 Hypokalemia: Secondary | ICD-10-CM | POA: Diagnosis not present

## 2022-09-17 DIAGNOSIS — E785 Hyperlipidemia, unspecified: Secondary | ICD-10-CM | POA: Diagnosis not present

## 2022-09-17 DIAGNOSIS — I10 Essential (primary) hypertension: Secondary | ICD-10-CM | POA: Diagnosis not present

## 2022-09-17 DIAGNOSIS — I471 Supraventricular tachycardia, unspecified: Secondary | ICD-10-CM | POA: Diagnosis not present

## 2022-09-17 DIAGNOSIS — R339 Retention of urine, unspecified: Secondary | ICD-10-CM | POA: Diagnosis not present

## 2022-09-17 DIAGNOSIS — N200 Calculus of kidney: Secondary | ICD-10-CM | POA: Diagnosis not present

## 2022-09-17 DIAGNOSIS — G35 Multiple sclerosis: Secondary | ICD-10-CM | POA: Diagnosis not present

## 2022-09-17 DIAGNOSIS — N390 Urinary tract infection, site not specified: Secondary | ICD-10-CM | POA: Diagnosis not present

## 2022-09-24 ENCOUNTER — Ambulatory Visit (HOSPITAL_COMMUNITY)
Admission: RE | Admit: 2022-09-24 | Discharge: 2022-09-24 | Disposition: A | Discharge status: Home / Self Care | Payer: Medicare HMO | Source: Ambulatory Visit | Attending: Interventional Radiology | Admitting: Interventional Radiology

## 2022-09-24 ENCOUNTER — Other Ambulatory Visit: Payer: Self-pay | Admitting: Neurology

## 2022-09-24 DIAGNOSIS — G379 Demyelinating disease of central nervous system, unspecified: Secondary | ICD-10-CM

## 2022-09-24 DIAGNOSIS — K8050 Calculus of bile duct without cholangitis or cholecystitis without obstruction: Secondary | ICD-10-CM | POA: Diagnosis not present

## 2022-09-24 DIAGNOSIS — Z434 Encounter for attention to other artificial openings of digestive tract: Secondary | ICD-10-CM | POA: Diagnosis not present

## 2022-09-24 DIAGNOSIS — K819 Cholecystitis, unspecified: Secondary | ICD-10-CM | POA: Diagnosis not present

## 2022-09-24 HISTORY — PX: IR EXCHANGE BILIARY DRAIN: IMG6046

## 2022-09-24 MED ORDER — LIDOCAINE HCL 1 % IJ SOLN
INTRAMUSCULAR | Status: AC
  Filled 2022-09-24: qty 20

## 2022-09-24 MED ORDER — LIDOCAINE HCL 1 % IJ SOLN
20.0000 mL | Freq: Once | INTRAMUSCULAR | Status: AC
  Administered 2022-09-24: 8 mL via INTRADERMAL

## 2022-09-25 DIAGNOSIS — R5381 Other malaise: Secondary | ICD-10-CM | POA: Diagnosis not present

## 2022-09-25 DIAGNOSIS — G0400 Acute disseminated encephalitis and encephalomyelitis, unspecified: Secondary | ICD-10-CM | POA: Diagnosis not present

## 2022-09-25 DIAGNOSIS — M5440 Lumbago with sciatica, unspecified side: Secondary | ICD-10-CM | POA: Diagnosis not present

## 2022-09-25 DIAGNOSIS — J9611 Chronic respiratory failure with hypoxia: Secondary | ICD-10-CM | POA: Diagnosis not present

## 2022-09-25 DIAGNOSIS — G8929 Other chronic pain: Secondary | ICD-10-CM | POA: Diagnosis not present

## 2022-10-01 DIAGNOSIS — I10 Essential (primary) hypertension: Secondary | ICD-10-CM | POA: Diagnosis not present

## 2022-10-01 DIAGNOSIS — R339 Retention of urine, unspecified: Secondary | ICD-10-CM | POA: Diagnosis not present

## 2022-10-01 DIAGNOSIS — N390 Urinary tract infection, site not specified: Secondary | ICD-10-CM | POA: Diagnosis not present

## 2022-10-01 DIAGNOSIS — D649 Anemia, unspecified: Secondary | ICD-10-CM | POA: Diagnosis not present

## 2022-10-01 DIAGNOSIS — N3281 Overactive bladder: Secondary | ICD-10-CM | POA: Diagnosis not present

## 2022-10-01 DIAGNOSIS — E876 Hypokalemia: Secondary | ICD-10-CM | POA: Diagnosis not present

## 2022-10-01 DIAGNOSIS — E119 Type 2 diabetes mellitus without complications: Secondary | ICD-10-CM | POA: Diagnosis not present

## 2022-10-01 DIAGNOSIS — G35 Multiple sclerosis: Secondary | ICD-10-CM | POA: Diagnosis not present

## 2022-10-01 DIAGNOSIS — I471 Supraventricular tachycardia, unspecified: Secondary | ICD-10-CM | POA: Diagnosis not present

## 2022-10-01 DIAGNOSIS — E785 Hyperlipidemia, unspecified: Secondary | ICD-10-CM | POA: Diagnosis not present

## 2022-10-01 DIAGNOSIS — N200 Calculus of kidney: Secondary | ICD-10-CM | POA: Diagnosis not present

## 2022-10-06 DIAGNOSIS — G35 Multiple sclerosis: Secondary | ICD-10-CM | POA: Diagnosis not present

## 2022-10-06 DIAGNOSIS — N390 Urinary tract infection, site not specified: Secondary | ICD-10-CM | POA: Diagnosis not present

## 2022-10-06 DIAGNOSIS — E785 Hyperlipidemia, unspecified: Secondary | ICD-10-CM | POA: Diagnosis not present

## 2022-10-06 DIAGNOSIS — E876 Hypokalemia: Secondary | ICD-10-CM | POA: Diagnosis not present

## 2022-10-06 DIAGNOSIS — N200 Calculus of kidney: Secondary | ICD-10-CM | POA: Diagnosis not present

## 2022-10-06 DIAGNOSIS — N3281 Overactive bladder: Secondary | ICD-10-CM | POA: Diagnosis not present

## 2022-10-06 DIAGNOSIS — D649 Anemia, unspecified: Secondary | ICD-10-CM | POA: Diagnosis not present

## 2022-10-06 DIAGNOSIS — E119 Type 2 diabetes mellitus without complications: Secondary | ICD-10-CM | POA: Diagnosis not present

## 2022-10-06 DIAGNOSIS — I10 Essential (primary) hypertension: Secondary | ICD-10-CM | POA: Diagnosis not present

## 2022-10-06 DIAGNOSIS — I471 Supraventricular tachycardia, unspecified: Secondary | ICD-10-CM | POA: Diagnosis not present

## 2022-10-06 DIAGNOSIS — R339 Retention of urine, unspecified: Secondary | ICD-10-CM | POA: Diagnosis not present

## 2022-10-15 DIAGNOSIS — R339 Retention of urine, unspecified: Secondary | ICD-10-CM | POA: Diagnosis not present

## 2022-10-15 DIAGNOSIS — E876 Hypokalemia: Secondary | ICD-10-CM | POA: Diagnosis not present

## 2022-10-15 DIAGNOSIS — I10 Essential (primary) hypertension: Secondary | ICD-10-CM | POA: Diagnosis not present

## 2022-10-15 DIAGNOSIS — E785 Hyperlipidemia, unspecified: Secondary | ICD-10-CM | POA: Diagnosis not present

## 2022-10-15 DIAGNOSIS — D649 Anemia, unspecified: Secondary | ICD-10-CM | POA: Diagnosis not present

## 2022-10-15 DIAGNOSIS — N200 Calculus of kidney: Secondary | ICD-10-CM | POA: Diagnosis not present

## 2022-10-15 DIAGNOSIS — I471 Supraventricular tachycardia, unspecified: Secondary | ICD-10-CM | POA: Diagnosis not present

## 2022-10-15 DIAGNOSIS — G35 Multiple sclerosis: Secondary | ICD-10-CM | POA: Diagnosis not present

## 2022-10-15 DIAGNOSIS — N390 Urinary tract infection, site not specified: Secondary | ICD-10-CM | POA: Diagnosis not present

## 2022-10-15 DIAGNOSIS — E119 Type 2 diabetes mellitus without complications: Secondary | ICD-10-CM | POA: Diagnosis not present

## 2022-10-15 DIAGNOSIS — N3281 Overactive bladder: Secondary | ICD-10-CM | POA: Diagnosis not present

## 2022-10-16 DIAGNOSIS — R339 Retention of urine, unspecified: Secondary | ICD-10-CM | POA: Diagnosis not present

## 2022-10-16 DIAGNOSIS — E785 Hyperlipidemia, unspecified: Secondary | ICD-10-CM | POA: Diagnosis not present

## 2022-10-16 DIAGNOSIS — I471 Supraventricular tachycardia, unspecified: Secondary | ICD-10-CM | POA: Diagnosis not present

## 2022-10-16 DIAGNOSIS — E876 Hypokalemia: Secondary | ICD-10-CM | POA: Diagnosis not present

## 2022-10-16 DIAGNOSIS — D649 Anemia, unspecified: Secondary | ICD-10-CM | POA: Diagnosis not present

## 2022-10-16 DIAGNOSIS — N390 Urinary tract infection, site not specified: Secondary | ICD-10-CM | POA: Diagnosis not present

## 2022-10-16 DIAGNOSIS — N3281 Overactive bladder: Secondary | ICD-10-CM | POA: Diagnosis not present

## 2022-10-16 DIAGNOSIS — I10 Essential (primary) hypertension: Secondary | ICD-10-CM | POA: Diagnosis not present

## 2022-10-16 DIAGNOSIS — N200 Calculus of kidney: Secondary | ICD-10-CM | POA: Diagnosis not present

## 2022-10-16 DIAGNOSIS — E119 Type 2 diabetes mellitus without complications: Secondary | ICD-10-CM | POA: Diagnosis not present

## 2022-10-16 DIAGNOSIS — G35 Multiple sclerosis: Secondary | ICD-10-CM | POA: Diagnosis not present

## 2022-10-20 ENCOUNTER — Telehealth: Payer: Self-pay | Admitting: Neurology

## 2022-10-20 DIAGNOSIS — G35 Multiple sclerosis: Secondary | ICD-10-CM | POA: Diagnosis not present

## 2022-10-20 DIAGNOSIS — I471 Supraventricular tachycardia, unspecified: Secondary | ICD-10-CM | POA: Diagnosis not present

## 2022-10-20 DIAGNOSIS — E119 Type 2 diabetes mellitus without complications: Secondary | ICD-10-CM | POA: Diagnosis not present

## 2022-10-20 DIAGNOSIS — N200 Calculus of kidney: Secondary | ICD-10-CM | POA: Diagnosis not present

## 2022-10-20 DIAGNOSIS — I10 Essential (primary) hypertension: Secondary | ICD-10-CM | POA: Diagnosis not present

## 2022-10-20 DIAGNOSIS — R339 Retention of urine, unspecified: Secondary | ICD-10-CM | POA: Diagnosis not present

## 2022-10-20 DIAGNOSIS — D649 Anemia, unspecified: Secondary | ICD-10-CM | POA: Diagnosis not present

## 2022-10-20 DIAGNOSIS — E785 Hyperlipidemia, unspecified: Secondary | ICD-10-CM | POA: Diagnosis not present

## 2022-10-20 DIAGNOSIS — E876 Hypokalemia: Secondary | ICD-10-CM | POA: Diagnosis not present

## 2022-10-20 DIAGNOSIS — N3281 Overactive bladder: Secondary | ICD-10-CM | POA: Diagnosis not present

## 2022-10-20 DIAGNOSIS — N390 Urinary tract infection, site not specified: Secondary | ICD-10-CM | POA: Diagnosis not present

## 2022-10-20 NOTE — Telephone Encounter (Signed)
Pt's wife called wanting to know if the appt that is scheduled can be a VV. Pt's last two visits have been VV. Please advise.

## 2022-10-20 NOTE — Telephone Encounter (Signed)
Pt last seen by Dr. Epimenio Foot 04/20/22 as mychart VV. Has f/u 10/27/22 with Dr. Epimenio Foot (in office)

## 2022-10-21 NOTE — Telephone Encounter (Signed)
Phone room Dr. Epimenio Foot approved Video Visit okay to schedule.

## 2022-10-22 ENCOUNTER — Other Ambulatory Visit: Payer: Self-pay

## 2022-10-27 ENCOUNTER — Encounter: Payer: Self-pay | Admitting: Neurology

## 2022-10-27 ENCOUNTER — Telehealth: Payer: Medicare HMO | Admitting: Neurology

## 2022-10-27 DIAGNOSIS — Z8619 Personal history of other infectious and parasitic diseases: Secondary | ICD-10-CM | POA: Diagnosis not present

## 2022-10-27 DIAGNOSIS — I471 Supraventricular tachycardia, unspecified: Secondary | ICD-10-CM | POA: Diagnosis not present

## 2022-10-27 DIAGNOSIS — Z8673 Personal history of transient ischemic attack (TIA), and cerebral infarction without residual deficits: Secondary | ICD-10-CM | POA: Diagnosis not present

## 2022-10-27 DIAGNOSIS — E119 Type 2 diabetes mellitus without complications: Secondary | ICD-10-CM | POA: Diagnosis not present

## 2022-10-27 DIAGNOSIS — E785 Hyperlipidemia, unspecified: Secondary | ICD-10-CM | POA: Diagnosis not present

## 2022-10-27 DIAGNOSIS — Z8669 Personal history of other diseases of the nervous system and sense organs: Secondary | ICD-10-CM

## 2022-10-27 DIAGNOSIS — E876 Hypokalemia: Secondary | ICD-10-CM | POA: Diagnosis not present

## 2022-10-27 DIAGNOSIS — N200 Calculus of kidney: Secondary | ICD-10-CM | POA: Diagnosis not present

## 2022-10-27 DIAGNOSIS — G35 Multiple sclerosis: Secondary | ICD-10-CM | POA: Diagnosis not present

## 2022-10-27 DIAGNOSIS — R269 Unspecified abnormalities of gait and mobility: Secondary | ICD-10-CM

## 2022-10-27 DIAGNOSIS — D649 Anemia, unspecified: Secondary | ICD-10-CM | POA: Diagnosis not present

## 2022-10-27 DIAGNOSIS — I10 Essential (primary) hypertension: Secondary | ICD-10-CM | POA: Diagnosis not present

## 2022-10-27 DIAGNOSIS — N390 Urinary tract infection, site not specified: Secondary | ICD-10-CM | POA: Diagnosis not present

## 2022-10-27 DIAGNOSIS — R339 Retention of urine, unspecified: Secondary | ICD-10-CM | POA: Diagnosis not present

## 2022-10-27 DIAGNOSIS — N3281 Overactive bladder: Secondary | ICD-10-CM | POA: Diagnosis not present

## 2022-10-27 NOTE — Progress Notes (Signed)
[facility]  PATIENT: Troy Randall DOB: May 07, 1960  REFERRING DOCTOR OR PCP:  Dr. Margo Aye SOURCE: Patient, extensive notes, laboratory tests, imaging results, other results from long hospitalization.  Imaging studies were personally reviewed.  _________________________________   HISTORICAL  CHIEF COMPLAINT:  Encephalopathy  HISTORY OF PRESENT ILLNESS:  Troy Randall is a 62 y.o. man with CNS demyelinating disease and history of strokes  Virtual Visit via Video Note I connected with Troy Randall on 10/27/22 at  3:00 PM EDT by a video enabled telemedicine application and verified that I am speaking with the correct person.  Due to poor quality sound, after about 5 minutes of video we switched to telephone for the remainder of the visit   I discussed the limitations of evaluation and management by telemedicine and the availability of in person appointments. The patient expressed understanding and agreed to proceed.  Patient at home; provider in office  Update 10/27/2022: He has been mostly stable.  However, his wife is concerned about a UTI.  They have notified his  PCP and a nurse could come to his house to collect a sample later today or tomorrow.   For the most part, he has wife feels that he is doing about the same as earlier this year.      His wife notes that he seems a little bit weaker since the last biliary drain was changed about 3 weeks ago.  He can take a few steps with a walker and transfer with assistance to get to a seated position at edge of bed.  He is doing 2/week Home PT.   He is better but not to baseline of September    He does not have numbness.    Due to urinary retention, he has a Foley catheter.    He has cognitive impairments always worse when infected.   He  reduced focus/attention and reduced processing.  Speech is slowed but no frank aphasia.    He can feed himself and has no dysphagia  He sleeps well at night and takes 1-2 naps during  the day.       He has the gall bladder drain replaced q 8 weeks and wife flushes 2 x day.   Not currently on antibiotics.    The issue has been whether he has demyelination or where the changes due to septic emboli.  If demyelination was this MS or ADEM.  Of note, the CSF did not show oligoclonal bands.  He had an MRI in March 2024 and there were no new lesions.  I discussed with the wife that if 1 more MRI next year also shows stability, then the likelihood of MS would be very small.    Physical examination: He is a well-developed well-nourished woman in no acute distress.  The head is normocephalic and atraumatic.  Sclera are anicteric.  Visible skin appears normal.  The neck has a good range of motion.  He e is alert and oriented.  Speech is slowed.  He answers questions appropriately.Marland Kitchen  Extraocular muscles are intact.  Facial strength is normal. Rapid alternating movements and finger-nose-finger are symmetric l.   History of neurologic events: He had had 2 strokes in the past, both affecting his speech.  The last stroke was in April 2023.  MRI 04/28/2021 had shown a small acute stroke in the subcortical white matter of the left frontal lobe.  Additionally there were several lacunar infarctions in the cerebral hemispheres and brainstem  He  was neurologically baseline until early September 2023.  He had a normal morning on 09/04/2021 but noted GI bleed prompting him to go to the ED.   he had jejunal bleeding on colonoscopy treated with embolization.  He was noted to have altered mental status a couple days later while in the hospital.  He was found to have sepsis.   He became less responsive.  He was noted to have renal failure and due to respiratory failure was intubated.      CT scan 09/12/2021 showed interval development of multiple white matter foci compared to April 2023 and MRI of the brain was recommended for further evaluation.  MRI of the brain 09/20/2021 and 09/21/2021 showed development of  multiple enhancing foci in the hemispheres.  The differential diagnosis was multiple strokes versus demyelination.  CSF evaluation 09/22/2021 showed normal cell count and protein and cultures.  IgG index was normal.  Oligoclonal bands were negative (he did have matched bands in both the serum and CSF).  HIV was negative.  He received IV steroids followed by plasmapheresis .  Due to GI bleeds he received multiple blood transfusions while in the hospital.  Initially, he had been unresponsive but had regained consciousness with treatment of the sepsis.  He continues to have reduced mental status for several weeks and began to improve towards the end of September, early October but remain mute for several more days.  He remained bedbound for several weeks and began to participate with PT more during October.  He was admitted to rehab on 10/30/2021.  He continues to do outpatient rehab.     Over the last month he has had multiple episodes of infection treated with antibiotics.  He is currently off of antibiotics.  He has improved physically and cognitively.   He can walk 50 feet with a walker now.  He communicates effectively and answers questions appropriately but cognition is not at baseline.  Imaging: MRI of the brain 04/01/2022 showed resolution of all of the enhancing lesions.  Some of them hypointense on both FLAIR and T1-weighted images.  There were no new lesions compared to September 2023.  MRI of the brain 09/20/2021 and 09/21/2021 showed multiple T2/FLAIR hyperintense foci in the hemispheres many of which enhanced after contrast.  Additional nonenhancing foci were noted in the cerebral hemispheres, left cerebellar hemisphere and left middle cerebellar peduncle.  All of the enhancing foci were new compared to the 04/28/2021 MRI and most of the nonenhancing foci were present on the previous MRI.  MRI of the brain 10/03/2021 showed multiple T2/FLAIR hyperintense foci in the cerebral hemispheres, cerebellum,  left middle cerebellar peduncle, pons.  Some of the foci enhanced after contrast.  MRI of the cervical spine 10/03/2021 showed a normal spinal cord.  MRI of the thoracic spine 10/03/2021 showed subtle T2 hyperintense foci at T4-T5 and T6.  No definite enhancement noted.  Study was difficult to interpret due to movement artifact.  MRI of the brain 04/28/2021 showed several infratentorial and supratentorial lacunar infarctions.  There was an acute small stroke in the left frontal lobe.  Chronic lacunar infarctions were noted in the left middle cerebellar peduncle, inferior cerebellum and in the cerebral hemispheres, left greater than right  He has had additioanl UTIs since d/c and just finished a round of antibiotics.     He is doing PT and has made progress but other medical issues have led to temporary setback.     REVIEW OF SYSTEMS: Constitutional: No fevers, chills,  sweats, or change in appetite Eyes: No visual changes, double vision, eye pain Ear, nose and throat: No hearing loss, ear pain, nasal congestion, sore throat Cardiovascular: No chest pain, palpitations Respiratory:  No shortness of breath at rest or with exertion.   No wheezes GastrointestinaI: No nausea, vomiting, diarrhea, abdominal pain, fecal incontinence Genitourinary: Currently has Foley catheter musculoskeletal:  No neck pain, back pain Integumentary: No rash, pruritus, skin lesions Neurological: as above Psychiatric: No depression at this time.  No anxiety Endocrine: No palpitations, diaphoresis, change in appetite, change in weigh or increased thirst Hematologic/Lymphatic:  No anemia, purpura, petechiae. Allergic/Immunologic: No itchy/runny eyes, nasal congestion, recent allergic reactions, rashes  ALLERGIES: Allergies  Allergen Reactions   Bactrim [Sulfamethoxazole-Trimethoprim] Rash   Amoxicillin Hives    Tolerated Cefepime    HOME MEDICATIONS:  Current Outpatient Medications:    BD VEO INSULIN SYRINGE U/F  31G X 15/64" 1 ML MISC,  USE AS DIRECTED, Disp: 100 each, Rfl: 2   glucose blood (ONETOUCH VERIO) test strip, TEST twice a day, Disp: 100 each, Rfl: 3   Insulin Pen Needle (NOVOTWIST) 32G X 5 MM MISC, Use two daily to inject Victoza and Toujeo., Disp: 90 each, Rfl: 5   methocarbamol (ROBAXIN) 500 MG tablet, Take 1 tablet (500 mg total) by mouth every 6 (six) hours as needed for muscle spasms., Disp: 30 tablet, Rfl: 0   metoprolol succinate (TOPROL-XL) 25 MG 24 hr tablet, Take 0.5 tablets (12.5 mg total) by mouth daily., Disp: 30 tablet, Rfl: 1   nitrofurantoin, macrocrystal-monohydrate, (MACROBID) 100 MG capsule, Take 100 mg by mouth 2 (two) times daily. (Patient not taking: Reported on 08/11/2022), Disp: , Rfl:    ONETOUCH DELICA LANCETS 33G MISC, Use to check blood sugar once a day dx code E11.65, Disp: 50 each, Rfl: 3   pantoprazole (PROTONIX) 40 MG tablet, Take 1 tablet (40 mg total) by mouth daily. (Patient taking differently: Take 40 mg by mouth daily before breakfast.), Disp: 30 tablet, Rfl: 0   potassium chloride SA (KLOR-CON M) 20 MEQ tablet, Take 2 tablets (40 mEq total) by mouth 2 (two) times daily., Disp: 10 tablet, Rfl: 0   Probiotic Product (PROBIOTIC PO), Take 8.5 mg by mouth daily., Disp: , Rfl:    rosuvastatin (CRESTOR) 40 MG tablet, Take 1 tablet (40 mg total) by mouth daily., Disp: 30 tablet, Rfl: 1   traZODone (DESYREL) 50 MG tablet, Take 0.5-1 tablets (25-50 mg total) by mouth at bedtime as needed for sleep. (Patient taking differently: Take 150 mg by mouth at bedtime.), Disp: , Rfl:    TRESIBA FLEXTOUCH 200 UNIT/ML FlexTouch Pen, Inject under skin 15 units twice a day. (Patient taking differently: Inject 16 Units into the skin See admin instructions. Inject 16 units into the skin 2 times a day- BGL must be 129 or greater), Disp: 3 mL, Rfl: 0   TYLENOL 500 MG tablet, Take 1 tablet (500 mg total) by mouth every 6 (six) hours as needed for mild pain or headache., Disp: 30 tablet, Rfl:  0  PAST MEDICAL HISTORY: Past Medical History:  Diagnosis Date   Anemia    Arthritis    CVA (cerebral vascular accident) (HCC)    last CVA 04/2021   Diabetes mellitus without complication (HCC)    type 2   Hyperlipidemia    Hypertension    Kidney stones     PAST SURGICAL HISTORY: Past Surgical History:  Procedure Laterality Date   ANKLE SURGERY     BIOPSY  09/10/2021   Procedure: BIOPSY;  Surgeon: Jeani Hawking, MD;  Location: [facility] ENDOSCOPY;  Service: Gastroenterology;;   BIOPSY  10/07/2021   Procedure: BIOPSY;  Surgeon: Jeani Hawking, MD;  Location[facility] ENDOSCOPY;  Service: Gastroenterology;;   COLONOSCOPY N/A 10/07/2021   Procedure: COLONOSCOPY;  Surgeon: Jeani Hawking, MD;  Location: [facility] ENDOSCOPY;  Service: Gastroenterology;  Laterality: N/A;   COLONOSCOPY WITH PROPOFOL N/A 09/06/2021   Procedure: COLONOSCOPY WITH PROPOFOL;  Surgeon: Dolores Frame, MD;  Location: [facility] ENDO SUITE;  Service: Gastroenterology;  Laterality: N/A;   CYSTOSCOPY/URETEROSCOPY/HOLMIUM LASER/STENT PLACEMENT Left 03/30/2022   Procedure: CYSTOSCOPY/RETROGRADE/URETEROSCOPY/HOLMIUM LASER/STENT PLACEMENT;  Surgeon: Rene Paci, MD;  Location: [facility] ORS;  Service: Urology;  Laterality: Left;   ENTEROSCOPY N/A 09/10/2021   Procedure: ENTEROSCOPY;  Surgeon: Jeani Hawking, MD;  Location: [facility] ENDOSCOPY;  Service: Gastroenterology;  Laterality: N/A;   ENTEROSCOPY  09/06/2021   Procedure: ENTEROSCOPY;  Surgeon: Dolores Frame, MD;  Location: [facility] ENDO SUITE;  Service: Gastroenterology;;   ESOPHAGOGASTRODUODENOSCOPY (EGD) WITH PROPOFOL  09/06/2021   Procedure: ESOPHAGOGASTRODUODENOSCOPY (EGD) WITH PROPOFOL;  Surgeon: Dolores Frame, MD;  Location: [facility] ENDO SU[facility];  Service: Gastroenterology;;   Wenda Low SIGMOIDOSCOPY N/A 09/19/2021   Procedure: Arnell Sieving;  Surgeon: Jeani Hawking, MD;  Location: [facility] ENDOSCOPY;  Service: Gastroenterology;  Laterality: N/A;   FOREIGN BODY REMOVAL   09/19/2021   Procedure: FOREIGN BODY REMOVAL;  Surgeon: Jeani Hawking, MD;  Location[facility] ENDOSCOPY;  Service: Gastroenterology;;   Emelda Brothers CAPSULE STUDY  09/06/2021   Procedure: GIVENS CAPSULE STUDY;  Surgeon: Marguerita Merles, Reuel Boom, MD;  Location: [facility] ENDO SUITE;  Service: Gastroenterology;;   HERNIA REPAIR     umbilical x1 Incisional x1   INCISIONAL HERNIA REPAIR  04/13/2011   Procedure: LAPAROSCOPIC INCISIONAL HERNIA;  Surgeon: Dalia Heading, MD;  Location: [facility] ORS;  Service: General;  Laterality: N/A;  Recurrent Laparoscopic Incisional Herniorraphy with Mesh   IR ANGIOGRAM SELECTIVE EACH ADDITIONAL VESSEL  09/09/2021   IR ANGIOGRAM VISCERAL SELECTIVE  09/07/2021   IR ANGIOGRAM VISCERAL SELECTIVE  09/06/2021   IR ANGIOGRAM VISCERAL SELECTIVE  09/06/2021   IR CHOLANGIOGRAM EXISTING TUBE  11/06/2021   IR EMBO ART  VEN HEMORR LYMPH EXTRAV  INC GUIDE ROADMAPPING  09/06/2021   IR EXCHANGE BILIARY DRAIN  10/24/2021   IR EXCHANGE BILIARY DRAIN  11/19/2021   IR EXCHANGE BILIARY DRAIN  01/19/2022   IR EXCHANGE BILIARY DRAIN  02/19/2022   IR EXCHANGE BILIARY DRAIN  04/01/2022   IR EXCHANGE BILIARY DRAIN  05/27/2022   IR EXCHANGE BILIARY DRAIN  07/24/2022   IR EXCHANGE BILIARY DRAIN  09/24/2022   IR GUIDED DRAIN W CATHETER PLACEMENT  09/07/2021   IR US GUIDE BX ASP/DRAIN  09/07/2021   IR US GUIDE VASC ACCESS RIGHT  09/07/2021   IR US GUIDE VASC ACCESS RIGHT  09/06/2021   KIDNEY STONE SURGERY     RADIOLOGY WITH ANESTHESIA N/A 03/10/2022   Procedure: MRI WITH ANESTHESIA OF BRAIN WITH AND WITHOUT CONTRAST;  Surgeon: Radiologist, Medication, MD;  Location: [facility] OR;  Service: Radiology;  Laterality: N/A;    FAMILY HISTORY: Family History  Problem Relation Age of Onset   Heart failure Mother    Diabetes Father    Cancer Other    Heart attack Other    Anesthesia problems Neg Hx    Hypotension Neg Hx    Malignant hyperthermia Neg Hx    Pseudochol deficiency Neg Hx    Colon cancer Neg Hx    Inflammatory bowel disease  Neg  Hx     SOCIAL HISTORY: Social History   Socioeconomic History   Marital status: Married    Spouse name: Not on file   Number of children: Not on file   Years of education: Not on file   Highest education level: Not on file  Occupational History   Not on file  Tobacco Use   Smoking status: Never   Smokeless tobacco: Former    Types: Snuff  Vaping Use   Vaping status: Never Used  Substance and Sexual Activity   Alcohol use: No   Drug use: No   Sexual activity: Yes    Birth control/protection: None  Other Topics Concern   Not on file  Social History Narrative   Live in [place], [place].   Married for over 30 years.   Has 2 children, age 96/30s. Has one grandchild, 19 months.    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    Eats all food groups.   Wears seatbelt.    Dip 1 can a day.    Social Determinants of Health   Financial Resource Strain: Not on file  Food Insecurity: No Food Insecurity (03/31/2022)   Hunger Vital Sign    Worried About Running Out of Food in the Last Year: Never true    Ran Out of Food in the Last Year: Never true  Transportation Needs: No Transportation Needs (03/31/2022)   PRAPARE - Administrator, Civil Service (Medical): No    Lack of Transportation (Non-Medical): No  Physical Activity: Not on file  Stress: Not on file  Social Connections: Not on file  Intimate Partner Violence: Not At Risk (03/31/2022)   Humiliation, Afraid, Rape, and Kick questionnaire    Fear of Current or Ex-Partner: No    Emotionally Abused: No    Physically Abused: No    Sexually Abused: No       DIAGNOSTIC DATA (LABS, IMAGING, TESTING) - I reviewed patient records, labs, notes, testing and imaging myself where available.  Lab Results  Component Value Date   WBC 6.0 03/31/2022   HGB 11.3 (L) 03/31/2022   HCT 36.1 (L) 03/31/2022   MCV 86.8 03/31/2022   PLT 175 03/31/2022      Component Value Date/Time   NA 134  (L) 04/03/2022 0339   K 3.1 (L) 04/03/2022 0339   CL 106 04/03/2022 0339   CO2 20 (L) 04/03/2022 0339   GLUCOSE 116 (H) 04/03/2022 0339   BUN 11 04/03/2022 0339   CREATININE 0.67 04/03/2022 0339   CREATININE 0.90 03/31/2017 1625   CALCIUM 8.1 (L) 04/03/2022 0339   PROT 6.1 (L) 03/30/2022 0321   ALBUMIN 2.9 (L) 04/03/2022 0339   ALBUMIN 1.9 (L) 09/22/2021 1348   AST 35 03/30/2022 0321   ALT 33 03/30/2022 0321   ALKPHOS 58 03/30/2022 0321   BILITOT 1.0 03/30/2022 0321   GFRNONAA >60 04/03/2022 0339   GFRAA >90 04/07/2011 0927   Lab Results  Component Value Date   CHOL 117 04/29/2021   HDL 32 (L) 04/29/2021   LDLCALC 66 04/29/2021   LDLDIRECT 101.0 01/21/2016   TRIG 93 04/29/2021   CHOLHDL 3.7 04/29/2021   Lab Results  Component Value Date   HGBA1C 6.6 (H) 03/29/2022   Lab Results  Component Value Date   VITAMINB12 1,427 (H) 09/22/2021   Lab Results  Component Value Date   TSH 1.057 11/07/2021       ASSESSMENT  AND PLAN  History of sepsis  Gait disturbance  History of encephalopathy  History of multiple strokes   The cause of the encephalopathy in the past, September 2023, is not 100% certain but I believe septic emboli is more likely than ADEM or MS.  He did have sepsis at the time.    It would be unusual for MS to have dozens of simultaneously enhancing lesions.  March 2024 MRI did not show any new lesions and CSF was normal.  The spinal cord has no lesions.   I do not recommend just beginning a disease modifying therapy at this time.  I would recommend that we check another MRI in the spring 2025.  If there are new changes consistent with MS I would reconsider and we could start Aubagio or another disease modifying therapy associated with low risk of infection.  He will continue physical therapy.  He is currently using a walker for short distances and has been mostly stable this year  Follow-up in 6 months I and family should call sooner if there are new or  worsening neurologic symptoms.   Follow Up Instructions: I discussed the assessment and treatment plan with the patient. The patient was provided an opportunity to ask questions and all were answered. The patient agreed with the plan and demonstrated an understanding of the instructions.    The patient was advised to call back or seek an in-person evaluation if the symptoms worsen or if the condition fails to improve as anticipated.  I provided 26 minutes of non-face-to-face time during this encounter.  This visit is part of a comprehensive longitudinal care medical relationship regarding the patients primary diagnosis of encephalopathy and related concerns.    Janecia Palau A. Epimenio Foot, MD, [facility] 10/27/2022, 3:34 PM Certified in Neurology, Clinical Neurophysiology, Sleep Medicine and Neuroimaging  [facility] [address], [address] [place], [place] [address] [phone]

## 2022-11-03 NOTE — Progress Notes (Signed)
11/03/2022  Patient ID: Troy Randall, male   DOB: 10-27-1960, 62 y.o.   MRN: [id number]    2025 Medication Assistance Renewal Application Summary:  Patient was outreached regarding medication assistance renewal for 2025. Verified address, anticipated insurance for 2025, and income has not changed. Patient remains interested in PAP for 2025 for Tresiba, please see below for other medications identified for medication assistance. Chauncey Mann and Ozempic were identified.    Medication Review Findings:  Patient previously prescribed Ozempic (and approved for PAP) but due to cost and all the GI gallbladder prolonged hospitalization this medication was stopped. Additionally, patient lost a lot of weight in the hospital.  Chauncey Mann was approved for PAP but was discontinued. [name] - confirmed no hypoglycemic events with patient and HbA1c 5.9% -6.7% (recently). [name] prescribed twice daily and discussed with Dr. Margo Aye to decrease (to eventually discontinue) or switch therapies. Per discussion with provider, he will continue [name] as patient was noted to have adherent issues ut will change dose to 18 units once daily.    Medication Assistance Findings:  Medication assistance needs identified: [name]    Plan: I will route patient assistance letter to pharmacy technician who will coordinate patient assistance program application process for medications listed above.  Pharmacy technician will assist with obtaining all required documents from both patient and provider(s) and submit application(s) once completed.    Thank you for allowing pharmacy to be a part of this patient's care.   Cephus Shelling, PharmD Clinical Pharmacist [facility] Cell: [phone]

## 2022-11-05 ENCOUNTER — Other Ambulatory Visit (HOSPITAL_COMMUNITY): Payer: Medicare HMO

## 2022-11-06 DIAGNOSIS — N3281 Overactive bladder: Secondary | ICD-10-CM | POA: Diagnosis not present

## 2022-11-06 DIAGNOSIS — E876 Hypokalemia: Secondary | ICD-10-CM | POA: Diagnosis not present

## 2022-11-06 DIAGNOSIS — E119 Type 2 diabetes mellitus without complications: Secondary | ICD-10-CM | POA: Diagnosis not present

## 2022-11-06 DIAGNOSIS — D649 Anemia, unspecified: Secondary | ICD-10-CM | POA: Diagnosis not present

## 2022-11-06 DIAGNOSIS — R339 Retention of urine, unspecified: Secondary | ICD-10-CM | POA: Diagnosis not present

## 2022-11-06 DIAGNOSIS — N390 Urinary tract infection, site not specified: Secondary | ICD-10-CM | POA: Diagnosis not present

## 2022-11-06 DIAGNOSIS — E785 Hyperlipidemia, unspecified: Secondary | ICD-10-CM | POA: Diagnosis not present

## 2022-11-06 DIAGNOSIS — N200 Calculus of kidney: Secondary | ICD-10-CM | POA: Diagnosis not present

## 2022-11-06 DIAGNOSIS — I10 Essential (primary) hypertension: Secondary | ICD-10-CM | POA: Diagnosis not present

## 2022-11-06 DIAGNOSIS — G35 Multiple sclerosis: Secondary | ICD-10-CM | POA: Diagnosis not present

## 2022-11-06 DIAGNOSIS — I471 Supraventricular tachycardia, unspecified: Secondary | ICD-10-CM | POA: Diagnosis not present

## 2022-11-09 DIAGNOSIS — I471 Supraventricular tachycardia, unspecified: Secondary | ICD-10-CM | POA: Diagnosis not present

## 2022-11-09 DIAGNOSIS — N390 Urinary tract infection, site not specified: Secondary | ICD-10-CM | POA: Diagnosis not present

## 2022-11-09 DIAGNOSIS — E876 Hypokalemia: Secondary | ICD-10-CM | POA: Diagnosis not present

## 2022-11-09 DIAGNOSIS — E785 Hyperlipidemia, unspecified: Secondary | ICD-10-CM | POA: Diagnosis not present

## 2022-11-09 DIAGNOSIS — R339 Retention of urine, unspecified: Secondary | ICD-10-CM | POA: Diagnosis not present

## 2022-11-09 DIAGNOSIS — G35 Multiple sclerosis: Secondary | ICD-10-CM | POA: Diagnosis not present

## 2022-11-09 DIAGNOSIS — D649 Anemia, unspecified: Secondary | ICD-10-CM | POA: Diagnosis not present

## 2022-11-09 DIAGNOSIS — N200 Calculus of kidney: Secondary | ICD-10-CM | POA: Diagnosis not present

## 2022-11-09 DIAGNOSIS — E119 Type 2 diabetes mellitus without complications: Secondary | ICD-10-CM | POA: Diagnosis not present

## 2022-11-09 DIAGNOSIS — I10 Essential (primary) hypertension: Secondary | ICD-10-CM | POA: Diagnosis not present

## 2022-11-09 DIAGNOSIS — N3281 Overactive bladder: Secondary | ICD-10-CM | POA: Diagnosis not present

## 2022-11-10 DIAGNOSIS — I471 Supraventricular tachycardia, unspecified: Secondary | ICD-10-CM | POA: Diagnosis not present

## 2022-11-10 DIAGNOSIS — E785 Hyperlipidemia, unspecified: Secondary | ICD-10-CM | POA: Diagnosis not present

## 2022-11-10 DIAGNOSIS — N3281 Overactive bladder: Secondary | ICD-10-CM | POA: Diagnosis not present

## 2022-11-10 DIAGNOSIS — N390 Urinary tract infection, site not specified: Secondary | ICD-10-CM | POA: Diagnosis not present

## 2022-11-10 DIAGNOSIS — E876 Hypokalemia: Secondary | ICD-10-CM | POA: Diagnosis not present

## 2022-11-10 DIAGNOSIS — R339 Retention of urine, unspecified: Secondary | ICD-10-CM | POA: Diagnosis not present

## 2022-11-10 DIAGNOSIS — E119 Type 2 diabetes mellitus without complications: Secondary | ICD-10-CM | POA: Diagnosis not present

## 2022-11-10 DIAGNOSIS — G35 Multiple sclerosis: Secondary | ICD-10-CM | POA: Diagnosis not present

## 2022-11-10 DIAGNOSIS — D649 Anemia, unspecified: Secondary | ICD-10-CM | POA: Diagnosis not present

## 2022-11-10 DIAGNOSIS — I10 Essential (primary) hypertension: Secondary | ICD-10-CM | POA: Diagnosis not present

## 2022-11-10 DIAGNOSIS — N200 Calculus of kidney: Secondary | ICD-10-CM | POA: Diagnosis not present

## 2022-11-11 DIAGNOSIS — N3281 Overactive bladder: Secondary | ICD-10-CM | POA: Diagnosis not present

## 2022-11-11 DIAGNOSIS — E119 Type 2 diabetes mellitus without complications: Secondary | ICD-10-CM | POA: Diagnosis not present

## 2022-11-11 DIAGNOSIS — R339 Retention of urine, unspecified: Secondary | ICD-10-CM | POA: Diagnosis not present

## 2022-11-11 DIAGNOSIS — I10 Essential (primary) hypertension: Secondary | ICD-10-CM | POA: Diagnosis not present

## 2022-11-11 DIAGNOSIS — G35 Multiple sclerosis: Secondary | ICD-10-CM | POA: Diagnosis not present

## 2022-11-11 DIAGNOSIS — N390 Urinary tract infection, site not specified: Secondary | ICD-10-CM | POA: Diagnosis not present

## 2022-11-11 DIAGNOSIS — I471 Supraventricular tachycardia, unspecified: Secondary | ICD-10-CM | POA: Diagnosis not present

## 2022-11-11 DIAGNOSIS — D649 Anemia, unspecified: Secondary | ICD-10-CM | POA: Diagnosis not present

## 2022-11-11 DIAGNOSIS — E785 Hyperlipidemia, unspecified: Secondary | ICD-10-CM | POA: Diagnosis not present

## 2022-11-11 DIAGNOSIS — E876 Hypokalemia: Secondary | ICD-10-CM | POA: Diagnosis not present

## 2022-11-11 DIAGNOSIS — N200 Calculus of kidney: Secondary | ICD-10-CM | POA: Diagnosis not present

## 2022-11-12 ENCOUNTER — Telehealth: Payer: Self-pay | Admitting: Pharmacy Technician

## 2022-11-12 DIAGNOSIS — Z5986 Financial insecurity: Secondary | ICD-10-CM

## 2022-11-12 NOTE — Progress Notes (Deleted)
Pharmacy Medication Assistance Program Note  {MEDASSISTINITIAL OUTREACH:31507}

## 2022-11-12 NOTE — Progress Notes (Signed)
[facility] Customer service manager [facility])                                            [facility] Quality Pharmacy Team    11/12/2022  Troy Randall 1960/01/20 [id number]                                      Medication Assistance Referral  Referral From:  [facility] [name] March  Medication/Company: [name] / [employer] Patient application portion:  Mailed Provider application portion: Faxed  to Dr. Benita Stabile Provider address/fax verified via: Office website  [name], CPhT [place]  Office: [phone] Fax: [phone] Email: [name]

## 2022-11-12 NOTE — Progress Notes (Signed)
Erroneous encouter

## 2022-11-15 DIAGNOSIS — N3281 Overactive bladder: Secondary | ICD-10-CM | POA: Diagnosis not present

## 2022-11-15 DIAGNOSIS — G35 Multiple sclerosis: Secondary | ICD-10-CM | POA: Diagnosis not present

## 2022-11-15 DIAGNOSIS — E876 Hypokalemia: Secondary | ICD-10-CM | POA: Diagnosis not present

## 2022-11-15 DIAGNOSIS — E119 Type 2 diabetes mellitus without complications: Secondary | ICD-10-CM | POA: Diagnosis not present

## 2022-11-15 DIAGNOSIS — R339 Retention of urine, unspecified: Secondary | ICD-10-CM | POA: Diagnosis not present

## 2022-11-15 DIAGNOSIS — D649 Anemia, unspecified: Secondary | ICD-10-CM | POA: Diagnosis not present

## 2022-11-15 DIAGNOSIS — N390 Urinary tract infection, site not specified: Secondary | ICD-10-CM | POA: Diagnosis not present

## 2022-11-15 DIAGNOSIS — N200 Calculus of kidney: Secondary | ICD-10-CM | POA: Diagnosis not present

## 2022-11-15 DIAGNOSIS — I471 Supraventricular tachycardia, unspecified: Secondary | ICD-10-CM | POA: Diagnosis not present

## 2022-11-15 DIAGNOSIS — I10 Essential (primary) hypertension: Secondary | ICD-10-CM | POA: Diagnosis not present

## 2022-11-15 DIAGNOSIS — E785 Hyperlipidemia, unspecified: Secondary | ICD-10-CM | POA: Diagnosis not present

## 2022-11-18 ENCOUNTER — Other Ambulatory Visit: Payer: Self-pay | Admitting: Urology

## 2022-11-19 ENCOUNTER — Other Ambulatory Visit (HOSPITAL_COMMUNITY): Payer: Self-pay | Admitting: Interventional Radiology

## 2022-11-19 ENCOUNTER — Ambulatory Visit (HOSPITAL_COMMUNITY)
Admission: RE | Admit: 2022-11-19 | Discharge: 2022-11-19 | Disposition: A | Discharge status: Home / Self Care | Payer: Medicare HMO | Source: Ambulatory Visit | Attending: Neurology | Admitting: Neurology

## 2022-11-19 DIAGNOSIS — K8050 Calculus of bile duct without cholangitis or cholecystitis without obstruction: Secondary | ICD-10-CM | POA: Diagnosis not present

## 2022-11-19 DIAGNOSIS — T85520A Displacement of bile duct prosthesis, initial encounter: Secondary | ICD-10-CM | POA: Diagnosis not present

## 2022-11-19 DIAGNOSIS — K8042 Calculus of bile duct with acute cholecystitis without obstruction: Secondary | ICD-10-CM | POA: Diagnosis not present

## 2022-11-19 DIAGNOSIS — G379 Demyelinating disease of central nervous system, unspecified: Secondary | ICD-10-CM | POA: Insufficient documentation

## 2022-11-19 DIAGNOSIS — Z434 Encounter for attention to other artificial openings of digestive tract: Secondary | ICD-10-CM | POA: Insufficient documentation

## 2022-11-19 DIAGNOSIS — K819 Cholecystitis, unspecified: Secondary | ICD-10-CM

## 2022-11-19 HISTORY — PX: IR EXCHANGE BILIARY DRAIN: IMG6046

## 2022-11-19 MED ORDER — SILVER NITRATE-POT NITRATE 75-25 % EX MISC
1.0000 | CUTANEOUS | Status: DC | PRN
  Filled 2022-11-19: qty 10

## 2022-11-19 MED ORDER — IOHEXOL 300 MG/ML  SOLN
50.0000 mL | Freq: Once | INTRAMUSCULAR | Status: AC | PRN
  Administered 2022-11-19: 10 mL

## 2022-11-19 MED ORDER — LIDOCAINE HCL (PF) 1 % IJ SOLN
5.0000 mL | Freq: Once | INTRAMUSCULAR | Status: AC
  Administered 2022-11-19: 5 mL via INTRADERMAL

## 2022-11-19 MED ORDER — LIDOCAINE-EPINEPHRINE 1 %-1:100000 IJ SOLN
INTRAMUSCULAR | Status: AC
  Filled 2022-11-19: qty 1

## 2022-11-19 NOTE — Procedures (Signed)
Pre procedural Dx: Chronic Chole tube Post procedural Dx: Same  Successful Fluoro guided exchange of 1 Fr drainage catheter placement into the gallbladder lumen. Chole tube connected to gravity bag.  EBL: None  Complications: None immediate  Katherina Right, MD Pager #: [phone]

## 2022-11-25 DIAGNOSIS — N390 Urinary tract infection, site not specified: Secondary | ICD-10-CM | POA: Diagnosis not present

## 2022-11-25 DIAGNOSIS — R339 Retention of urine, unspecified: Secondary | ICD-10-CM | POA: Diagnosis not present

## 2022-11-25 DIAGNOSIS — D649 Anemia, unspecified: Secondary | ICD-10-CM | POA: Diagnosis not present

## 2022-11-25 DIAGNOSIS — I10 Essential (primary) hypertension: Secondary | ICD-10-CM | POA: Diagnosis not present

## 2022-11-25 DIAGNOSIS — E119 Type 2 diabetes mellitus without complications: Secondary | ICD-10-CM | POA: Diagnosis not present

## 2022-11-25 DIAGNOSIS — N200 Calculus of kidney: Secondary | ICD-10-CM | POA: Diagnosis not present

## 2022-11-25 DIAGNOSIS — G35 Multiple sclerosis: Secondary | ICD-10-CM | POA: Diagnosis not present

## 2022-11-25 DIAGNOSIS — E785 Hyperlipidemia, unspecified: Secondary | ICD-10-CM | POA: Diagnosis not present

## 2022-11-25 DIAGNOSIS — I471 Supraventricular tachycardia, unspecified: Secondary | ICD-10-CM | POA: Diagnosis not present

## 2022-11-25 DIAGNOSIS — E876 Hypokalemia: Secondary | ICD-10-CM | POA: Diagnosis not present

## 2022-11-25 DIAGNOSIS — N3281 Overactive bladder: Secondary | ICD-10-CM | POA: Diagnosis not present

## 2022-11-26 DIAGNOSIS — E119 Type 2 diabetes mellitus without complications: Secondary | ICD-10-CM | POA: Diagnosis not present

## 2022-11-26 DIAGNOSIS — D649 Anemia, unspecified: Secondary | ICD-10-CM | POA: Diagnosis not present

## 2022-11-26 DIAGNOSIS — R339 Retention of urine, unspecified: Secondary | ICD-10-CM | POA: Diagnosis not present

## 2022-11-26 DIAGNOSIS — I10 Essential (primary) hypertension: Secondary | ICD-10-CM | POA: Diagnosis not present

## 2022-11-26 DIAGNOSIS — N390 Urinary tract infection, site not specified: Secondary | ICD-10-CM | POA: Diagnosis not present

## 2022-11-26 DIAGNOSIS — N200 Calculus of kidney: Secondary | ICD-10-CM | POA: Diagnosis not present

## 2022-11-26 DIAGNOSIS — G35 Multiple sclerosis: Secondary | ICD-10-CM | POA: Diagnosis not present

## 2022-11-26 DIAGNOSIS — E785 Hyperlipidemia, unspecified: Secondary | ICD-10-CM | POA: Diagnosis not present

## 2022-11-26 DIAGNOSIS — I471 Supraventricular tachycardia, unspecified: Secondary | ICD-10-CM | POA: Diagnosis not present

## 2022-11-26 DIAGNOSIS — E876 Hypokalemia: Secondary | ICD-10-CM | POA: Diagnosis not present

## 2022-11-26 DIAGNOSIS — N3281 Overactive bladder: Secondary | ICD-10-CM | POA: Diagnosis not present

## 2022-12-02 DIAGNOSIS — Z960 Presence of urogenital implants: Secondary | ICD-10-CM | POA: Diagnosis not present

## 2022-12-02 DIAGNOSIS — I10 Essential (primary) hypertension: Secondary | ICD-10-CM | POA: Diagnosis not present

## 2022-12-02 DIAGNOSIS — N200 Calculus of kidney: Secondary | ICD-10-CM | POA: Diagnosis not present

## 2022-12-02 DIAGNOSIS — E861 Hypovolemia: Secondary | ICD-10-CM | POA: Diagnosis not present

## 2022-12-02 DIAGNOSIS — N390 Urinary tract infection, site not specified: Secondary | ICD-10-CM | POA: Diagnosis not present

## 2022-12-02 DIAGNOSIS — K529 Noninfective gastroenteritis and colitis, unspecified: Secondary | ICD-10-CM | POA: Diagnosis not present

## 2022-12-02 DIAGNOSIS — G0400 Acute disseminated encephalitis and encephalomyelitis, unspecified: Secondary | ICD-10-CM | POA: Diagnosis not present

## 2022-12-02 DIAGNOSIS — K922 Gastrointestinal hemorrhage, unspecified: Secondary | ICD-10-CM | POA: Diagnosis not present

## 2022-12-02 DIAGNOSIS — L270 Generalized skin eruption due to drugs and medicaments taken internally: Secondary | ICD-10-CM | POA: Diagnosis not present

## 2022-12-02 DIAGNOSIS — K819 Cholecystitis, unspecified: Secondary | ICD-10-CM | POA: Diagnosis not present

## 2022-12-02 DIAGNOSIS — E1165 Type 2 diabetes mellitus with hyperglycemia: Secondary | ICD-10-CM | POA: Diagnosis not present

## 2022-12-02 DIAGNOSIS — E876 Hypokalemia: Secondary | ICD-10-CM | POA: Diagnosis not present

## 2022-12-04 DIAGNOSIS — E785 Hyperlipidemia, unspecified: Secondary | ICD-10-CM | POA: Diagnosis not present

## 2022-12-04 DIAGNOSIS — N3281 Overactive bladder: Secondary | ICD-10-CM | POA: Diagnosis not present

## 2022-12-04 DIAGNOSIS — I471 Supraventricular tachycardia, unspecified: Secondary | ICD-10-CM | POA: Diagnosis not present

## 2022-12-04 DIAGNOSIS — D649 Anemia, unspecified: Secondary | ICD-10-CM | POA: Diagnosis not present

## 2022-12-04 DIAGNOSIS — I10 Essential (primary) hypertension: Secondary | ICD-10-CM | POA: Diagnosis not present

## 2022-12-04 DIAGNOSIS — E119 Type 2 diabetes mellitus without complications: Secondary | ICD-10-CM | POA: Diagnosis not present

## 2022-12-04 DIAGNOSIS — N200 Calculus of kidney: Secondary | ICD-10-CM | POA: Diagnosis not present

## 2022-12-04 DIAGNOSIS — R339 Retention of urine, unspecified: Secondary | ICD-10-CM | POA: Diagnosis not present

## 2022-12-04 DIAGNOSIS — G35 Multiple sclerosis: Secondary | ICD-10-CM | POA: Diagnosis not present

## 2022-12-04 DIAGNOSIS — E876 Hypokalemia: Secondary | ICD-10-CM | POA: Diagnosis not present

## 2022-12-04 DIAGNOSIS — N390 Urinary tract infection, site not specified: Secondary | ICD-10-CM | POA: Diagnosis not present

## 2022-12-09 DIAGNOSIS — N200 Calculus of kidney: Secondary | ICD-10-CM | POA: Diagnosis not present

## 2022-12-09 DIAGNOSIS — N3281 Overactive bladder: Secondary | ICD-10-CM | POA: Diagnosis not present

## 2022-12-09 DIAGNOSIS — I471 Supraventricular tachycardia, unspecified: Secondary | ICD-10-CM | POA: Diagnosis not present

## 2022-12-09 DIAGNOSIS — D649 Anemia, unspecified: Secondary | ICD-10-CM | POA: Diagnosis not present

## 2022-12-09 DIAGNOSIS — E876 Hypokalemia: Secondary | ICD-10-CM | POA: Diagnosis not present

## 2022-12-09 DIAGNOSIS — N390 Urinary tract infection, site not specified: Secondary | ICD-10-CM | POA: Diagnosis not present

## 2022-12-09 DIAGNOSIS — I10 Essential (primary) hypertension: Secondary | ICD-10-CM | POA: Diagnosis not present

## 2022-12-09 DIAGNOSIS — R339 Retention of urine, unspecified: Secondary | ICD-10-CM | POA: Diagnosis not present

## 2022-12-09 DIAGNOSIS — G35 Multiple sclerosis: Secondary | ICD-10-CM | POA: Diagnosis not present

## 2022-12-09 DIAGNOSIS — E119 Type 2 diabetes mellitus without complications: Secondary | ICD-10-CM | POA: Diagnosis not present

## 2022-12-09 DIAGNOSIS — E785 Hyperlipidemia, unspecified: Secondary | ICD-10-CM | POA: Diagnosis not present

## 2022-12-15 DIAGNOSIS — E876 Hypokalemia: Secondary | ICD-10-CM | POA: Diagnosis not present

## 2022-12-15 DIAGNOSIS — N200 Calculus of kidney: Secondary | ICD-10-CM | POA: Diagnosis not present

## 2022-12-15 DIAGNOSIS — E119 Type 2 diabetes mellitus without complications: Secondary | ICD-10-CM | POA: Diagnosis not present

## 2022-12-15 DIAGNOSIS — E785 Hyperlipidemia, unspecified: Secondary | ICD-10-CM | POA: Diagnosis not present

## 2022-12-15 DIAGNOSIS — N3281 Overactive bladder: Secondary | ICD-10-CM | POA: Diagnosis not present

## 2022-12-15 DIAGNOSIS — G35 Multiple sclerosis: Secondary | ICD-10-CM | POA: Diagnosis not present

## 2022-12-15 DIAGNOSIS — N390 Urinary tract infection, site not specified: Secondary | ICD-10-CM | POA: Diagnosis not present

## 2022-12-15 DIAGNOSIS — D649 Anemia, unspecified: Secondary | ICD-10-CM | POA: Diagnosis not present

## 2022-12-15 DIAGNOSIS — I10 Essential (primary) hypertension: Secondary | ICD-10-CM | POA: Diagnosis not present

## 2022-12-15 DIAGNOSIS — I471 Supraventricular tachycardia, unspecified: Secondary | ICD-10-CM | POA: Diagnosis not present

## 2022-12-15 DIAGNOSIS — R339 Retention of urine, unspecified: Secondary | ICD-10-CM | POA: Diagnosis not present

## 2022-12-16 DIAGNOSIS — Z0000 Encounter for general adult medical examination without abnormal findings: Secondary | ICD-10-CM | POA: Diagnosis not present

## 2023-01-05 DIAGNOSIS — N390 Urinary tract infection, site not specified: Secondary | ICD-10-CM | POA: Diagnosis not present

## 2023-01-05 DIAGNOSIS — Z6828 Body mass index (BMI) 28.0-28.9, adult: Secondary | ICD-10-CM | POA: Diagnosis not present

## 2023-01-05 DIAGNOSIS — D649 Anemia, unspecified: Secondary | ICD-10-CM | POA: Diagnosis not present

## 2023-01-05 DIAGNOSIS — G35 Multiple sclerosis: Secondary | ICD-10-CM | POA: Diagnosis not present

## 2023-01-05 DIAGNOSIS — E876 Hypokalemia: Secondary | ICD-10-CM | POA: Diagnosis not present

## 2023-01-05 DIAGNOSIS — Z794 Long term (current) use of insulin: Secondary | ICD-10-CM | POA: Diagnosis not present

## 2023-01-05 DIAGNOSIS — I471 Supraventricular tachycardia, unspecified: Secondary | ICD-10-CM | POA: Diagnosis not present

## 2023-01-05 DIAGNOSIS — I10 Essential (primary) hypertension: Secondary | ICD-10-CM | POA: Diagnosis not present

## 2023-01-05 DIAGNOSIS — E119 Type 2 diabetes mellitus without complications: Secondary | ICD-10-CM | POA: Diagnosis not present

## 2023-01-05 DIAGNOSIS — R339 Retention of urine, unspecified: Secondary | ICD-10-CM | POA: Diagnosis not present

## 2023-01-05 DIAGNOSIS — Z436 Encounter for attention to other artificial openings of urinary tract: Secondary | ICD-10-CM | POA: Diagnosis not present

## 2023-01-05 DIAGNOSIS — Z8673 Personal history of transient ischemic attack (TIA), and cerebral infarction without residual deficits: Secondary | ICD-10-CM | POA: Diagnosis not present

## 2023-01-05 DIAGNOSIS — Z9181 History of falling: Secondary | ICD-10-CM | POA: Diagnosis not present

## 2023-01-05 DIAGNOSIS — N200 Calculus of kidney: Secondary | ICD-10-CM | POA: Diagnosis not present

## 2023-01-05 DIAGNOSIS — N3281 Overactive bladder: Secondary | ICD-10-CM | POA: Diagnosis not present

## 2023-01-05 DIAGNOSIS — Z466 Encounter for fitting and adjustment of urinary device: Secondary | ICD-10-CM | POA: Diagnosis not present

## 2023-01-05 DIAGNOSIS — E785 Hyperlipidemia, unspecified: Secondary | ICD-10-CM | POA: Diagnosis not present

## 2023-01-17 ENCOUNTER — Other Ambulatory Visit: Payer: Self-pay

## 2023-01-17 ENCOUNTER — Emergency Department (HOSPITAL_COMMUNITY): Payer: Medicare HMO

## 2023-01-17 ENCOUNTER — Encounter (HOSPITAL_COMMUNITY): Payer: Self-pay | Admitting: *Deleted

## 2023-01-17 ENCOUNTER — Inpatient Hospital Stay (HOSPITAL_COMMUNITY): Payer: Medicare HMO

## 2023-01-17 ENCOUNTER — Inpatient Hospital Stay (HOSPITAL_COMMUNITY)
Admission: EM | Admit: 2023-01-17 | Discharge: 2023-01-21 | DRG: 871 | Disposition: A | Discharge status: Home Health | Payer: Medicare HMO | Attending: Family Medicine | Admitting: Family Medicine

## 2023-01-17 DIAGNOSIS — Z8661 Personal history of infections of the central nervous system: Secondary | ICD-10-CM

## 2023-01-17 DIAGNOSIS — R7401 Elevation of levels of liver transaminase levels: Secondary | ICD-10-CM | POA: Diagnosis present

## 2023-01-17 DIAGNOSIS — A419 Sepsis, unspecified organism: Principal | ICD-10-CM | POA: Diagnosis present

## 2023-01-17 DIAGNOSIS — Z87891 Personal history of nicotine dependence: Secondary | ICD-10-CM | POA: Diagnosis not present

## 2023-01-17 DIAGNOSIS — Z87442 Personal history of urinary calculi: Secondary | ICD-10-CM | POA: Diagnosis not present

## 2023-01-17 DIAGNOSIS — K219 Gastro-esophageal reflux disease without esophagitis: Secondary | ICD-10-CM | POA: Diagnosis present

## 2023-01-17 DIAGNOSIS — K5710 Diverticulosis of small intestine without perforation or abscess without bleeding: Secondary | ICD-10-CM | POA: Diagnosis not present

## 2023-01-17 DIAGNOSIS — Z960 Presence of urogenital implants: Secondary | ICD-10-CM | POA: Diagnosis present

## 2023-01-17 DIAGNOSIS — K449 Diaphragmatic hernia without obstruction or gangrene: Secondary | ICD-10-CM | POA: Diagnosis present

## 2023-01-17 DIAGNOSIS — R5381 Other malaise: Secondary | ICD-10-CM | POA: Diagnosis present

## 2023-01-17 DIAGNOSIS — K8000 Calculus of gallbladder with acute cholecystitis without obstruction: Secondary | ICD-10-CM

## 2023-01-17 DIAGNOSIS — Z8249 Family history of ischemic heart disease and other diseases of the circulatory system: Secondary | ICD-10-CM

## 2023-01-17 DIAGNOSIS — I10 Essential (primary) hypertension: Secondary | ICD-10-CM | POA: Diagnosis not present

## 2023-01-17 DIAGNOSIS — K819 Cholecystitis, unspecified: Secondary | ICD-10-CM | POA: Diagnosis not present

## 2023-01-17 DIAGNOSIS — T85520A Displacement of bile duct prosthesis, initial encounter: Secondary | ICD-10-CM

## 2023-01-17 DIAGNOSIS — T83511A Infection and inflammatory reaction due to indwelling urethral catheter, initial encounter: Secondary | ICD-10-CM

## 2023-01-17 DIAGNOSIS — K429 Umbilical hernia without obstruction or gangrene: Secondary | ICD-10-CM | POA: Diagnosis not present

## 2023-01-17 DIAGNOSIS — N200 Calculus of kidney: Secondary | ICD-10-CM | POA: Diagnosis present

## 2023-01-17 DIAGNOSIS — G0400 Acute disseminated encephalitis and encephalomyelitis, unspecified: Secondary | ICD-10-CM | POA: Diagnosis present

## 2023-01-17 DIAGNOSIS — R4182 Altered mental status, unspecified: Secondary | ICD-10-CM | POA: Diagnosis not present

## 2023-01-17 DIAGNOSIS — Z881 Allergy status to other antibiotic agents status: Secondary | ICD-10-CM

## 2023-01-17 DIAGNOSIS — Y732 Prosthetic and other implants, materials and accessory gastroenterology and urology devices associated with adverse incidents: Secondary | ICD-10-CM | POA: Diagnosis present

## 2023-01-17 DIAGNOSIS — R748 Abnormal levels of other serum enzymes: Secondary | ICD-10-CM | POA: Diagnosis not present

## 2023-01-17 DIAGNOSIS — E785 Hyperlipidemia, unspecified: Secondary | ICD-10-CM | POA: Diagnosis present

## 2023-01-17 DIAGNOSIS — G9341 Metabolic encephalopathy: Secondary | ICD-10-CM | POA: Diagnosis present

## 2023-01-17 DIAGNOSIS — Z882 Allergy status to sulfonamides status: Secondary | ICD-10-CM

## 2023-01-17 DIAGNOSIS — R17 Unspecified jaundice: Secondary | ICD-10-CM | POA: Diagnosis not present

## 2023-01-17 DIAGNOSIS — R319 Hematuria, unspecified: Secondary | ICD-10-CM | POA: Diagnosis not present

## 2023-01-17 DIAGNOSIS — K8040 Calculus of bile duct with cholecystitis, unspecified, without obstruction: Secondary | ICD-10-CM | POA: Diagnosis present

## 2023-01-17 DIAGNOSIS — N390 Urinary tract infection, site not specified: Secondary | ICD-10-CM | POA: Diagnosis not present

## 2023-01-17 DIAGNOSIS — Z833 Family history of diabetes mellitus: Secondary | ICD-10-CM | POA: Diagnosis not present

## 2023-01-17 DIAGNOSIS — Z8673 Personal history of transient ischemic attack (TIA), and cerebral infarction without residual deficits: Secondary | ICD-10-CM | POA: Diagnosis not present

## 2023-01-17 DIAGNOSIS — Z794 Long term (current) use of insulin: Secondary | ICD-10-CM | POA: Diagnosis not present

## 2023-01-17 DIAGNOSIS — N179 Acute kidney failure, unspecified: Secondary | ICD-10-CM | POA: Diagnosis present

## 2023-01-17 DIAGNOSIS — N210 Calculus in bladder: Secondary | ICD-10-CM | POA: Diagnosis not present

## 2023-01-17 DIAGNOSIS — E876 Hypokalemia: Secondary | ICD-10-CM | POA: Diagnosis not present

## 2023-01-17 DIAGNOSIS — E119 Type 2 diabetes mellitus without complications: Secondary | ICD-10-CM | POA: Diagnosis not present

## 2023-01-17 DIAGNOSIS — E872 Acidosis, unspecified: Secondary | ICD-10-CM | POA: Diagnosis not present

## 2023-01-17 DIAGNOSIS — D696 Thrombocytopenia, unspecified: Secondary | ICD-10-CM | POA: Diagnosis present

## 2023-01-17 DIAGNOSIS — R109 Unspecified abdominal pain: Secondary | ICD-10-CM | POA: Diagnosis not present

## 2023-01-17 DIAGNOSIS — T85520D Displacement of bile duct prosthesis, subsequent encounter: Secondary | ICD-10-CM

## 2023-01-17 DIAGNOSIS — I679 Cerebrovascular disease, unspecified: Secondary | ICD-10-CM | POA: Diagnosis present

## 2023-01-17 DIAGNOSIS — Z880 Allergy status to penicillin: Secondary | ICD-10-CM | POA: Diagnosis not present

## 2023-01-17 DIAGNOSIS — K8020 Calculus of gallbladder without cholecystitis without obstruction: Secondary | ICD-10-CM | POA: Diagnosis not present

## 2023-01-17 DIAGNOSIS — Z7401 Bed confinement status: Secondary | ICD-10-CM | POA: Diagnosis not present

## 2023-01-17 DIAGNOSIS — R4189 Other symptoms and signs involving cognitive functions and awareness: Secondary | ICD-10-CM | POA: Diagnosis present

## 2023-01-17 DIAGNOSIS — Z743 Need for continuous supervision: Secondary | ICD-10-CM | POA: Diagnosis not present

## 2023-01-17 DIAGNOSIS — T85518A Breakdown (mechanical) of other gastrointestinal prosthetic devices, implants and grafts, initial encounter: Secondary | ICD-10-CM

## 2023-01-17 DIAGNOSIS — T85590A Other mechanical complication of bile duct prosthesis, initial encounter: Secondary | ICD-10-CM | POA: Diagnosis not present

## 2023-01-17 DIAGNOSIS — K8050 Calculus of bile duct without cholangitis or cholecystitis without obstruction: Secondary | ICD-10-CM | POA: Diagnosis not present

## 2023-01-17 DIAGNOSIS — Z8711 Personal history of peptic ulcer disease: Secondary | ICD-10-CM

## 2023-01-17 DIAGNOSIS — Z79899 Other long term (current) drug therapy: Secondary | ICD-10-CM

## 2023-01-17 DIAGNOSIS — K8071 Calculus of gallbladder and bile duct without cholecystitis with obstruction: Secondary | ICD-10-CM | POA: Diagnosis not present

## 2023-01-17 LAB — URINALYSIS, ROUTINE W REFLEX MICROSCOPIC
Glucose, UA: NEGATIVE mg/dL
Hgb urine dipstick: NEGATIVE
Ketones, ur: NEGATIVE mg/dL
Nitrite: POSITIVE — AB
Protein, ur: 100 mg/dL — AB
Specific Gravity, Urine: 1.028 (ref 1.005–1.030)
WBC, UA: >50 WBC/hpf (ref 0–5)
pH: 5 (ref 5.0–8.0)

## 2023-01-17 LAB — COMPREHENSIVE METABOLIC PANEL
ALT: 473 U/L — ABNORMAL HIGH (ref 0–44)
AST: 386 U/L — ABNORMAL HIGH (ref 15–41)
Albumin: 3.3 g/dL — ABNORMAL LOW (ref 3.5–5.0)
Alkaline Phosphatase: 288 U/L — ABNORMAL HIGH (ref 38–126)
Anion gap: 13 (ref 5–15)
BUN: 14 mg/dL (ref 6–20)
CO2: 26 mmol/L (ref 22–32)
Calcium: 9 mg/dL (ref 8.9–10.3)
Chloride: 99 mmol/L (ref 98–111)
Creatinine, Ser: 1.11 mg/dL (ref 0.61–1.24)
GFR, Estimated: >60 mL/min (ref >60)
Glucose, Bld: 145 mg/dL — ABNORMAL HIGH (ref 70–99)
Potassium: 3 mmol/L — ABNORMAL LOW (ref 3.5–5.1)
Sodium: 138 mmol/L (ref 135–145)
Total Bilirubin: 4.1 mg/dL — ABNORMAL HIGH (ref 0.0–1.2)
Total Protein: 6.9 g/dL (ref 6.5–8.1)

## 2023-01-17 LAB — CBC WITH DIFFERENTIAL/PLATELET
Abs Immature Granulocytes: 0.04 10*3/uL (ref 0.00–0.07)
Basophils Absolute: 0 10*3/uL (ref 0.0–0.1)
Basophils Relative: 0 %
Eosinophils Absolute: 0.1 10*3/uL (ref 0.0–0.5)
Eosinophils Relative: 1 %
HCT: 38.7 % — ABNORMAL LOW (ref 39.0–52.0)
Hemoglobin: 13.1 g/dL (ref 13.0–17.0)
Immature Granulocytes: 0 %
Lymphocytes Relative: 5 %
Lymphs Abs: 0.6 10*3/uL — ABNORMAL LOW (ref 0.7–4.0)
MCH: 28.6 pg (ref 26.0–34.0)
MCHC: 33.9 g/dL (ref 30.0–36.0)
MCV: 84.5 fL (ref 80.0–100.0)
Monocytes Absolute: 0.4 10*3/uL (ref 0.1–1.0)
Monocytes Relative: 3 %
Neutro Abs: 10.3 10*3/uL — ABNORMAL HIGH (ref 1.7–7.7)
Neutrophils Relative %: 91 %
Platelets: 141 10*3/uL — ABNORMAL LOW (ref 150–400)
RBC: 4.58 MIL/uL (ref 4.22–5.81)
RDW: 13.6 % (ref 11.5–15.5)
WBC: 11.5 10*3/uL — ABNORMAL HIGH (ref 4.0–10.5)
nRBC: 0 % (ref 0.0–0.2)

## 2023-01-17 LAB — LACTIC ACID, PLASMA
Lactic Acid, Venous: 3.1 mmol/L (ref 0.5–1.9)
Lactic Acid, Venous: 3 mmol/L (ref 0.5–1.9)

## 2023-01-17 LAB — I-STAT CG4 LACTIC ACID, ED: Lactic Acid, Venous: 1.4 mmol/L (ref 0.5–1.9)

## 2023-01-17 LAB — LIPASE, BLOOD: Lipase: 49 U/L (ref 11–51)

## 2023-01-17 LAB — AMMONIA: Ammonia: 12 umol/L (ref 9–35)

## 2023-01-17 MED ORDER — SODIUM CHLORIDE 0.9 % IV SOLN
2.0000 g | Freq: Once | INTRAVENOUS | Status: AC
  Administered 2023-01-17: 2 g via INTRAVENOUS
  Filled 2023-01-17: qty 12.5

## 2023-01-17 MED ORDER — POTASSIUM CHLORIDE CRYS ER 20 MEQ PO TBCR
40.0000 meq | EXTENDED_RELEASE_TABLET | Freq: Once | ORAL | Status: AC
  Administered 2023-01-17: 40 meq via ORAL
  Filled 2023-01-17: qty 2

## 2023-01-17 MED ORDER — METRONIDAZOLE 500 MG/100ML IV SOLN
500.0000 mg | Freq: Once | INTRAVENOUS | Status: AC
  Administered 2023-01-17: 500 mg via INTRAVENOUS
  Filled 2023-01-17: qty 100

## 2023-01-17 MED ORDER — MORPHINE SULFATE (PF) 2 MG/ML IV SOLN
1.0000 mg | INTRAVENOUS | Status: DC | PRN
  Administered 2023-01-17: 1 mg via INTRAVENOUS
  Filled 2023-01-17: qty 1

## 2023-01-17 MED ORDER — SODIUM CHLORIDE 0.9 % IV SOLN
2.0000 g | INTRAVENOUS | Status: DC
  Administered 2023-01-17 – 2023-01-20 (×4): 2 g via INTRAVENOUS
  Filled 2023-01-17 (×4): qty 20

## 2023-01-17 MED ORDER — SODIUM CHLORIDE 0.9 % IV BOLUS
1000.0000 mL | Freq: Once | INTRAVENOUS | Status: AC
  Administered 2023-01-17: 1000 mL via INTRAVENOUS

## 2023-01-17 MED ORDER — IOHEXOL 350 MG/ML SOLN
75.0000 mL | Freq: Once | INTRAVENOUS | Status: AC | PRN
  Administered 2023-01-17: 75 mL via INTRAVENOUS

## 2023-01-17 MED ORDER — METOPROLOL SUCCINATE ER 25 MG PO TB24
12.5000 mg | ORAL_TABLET | Freq: Every day | ORAL | Status: DC
  Administered 2023-01-17 – 2023-01-21 (×5): 12.5 mg via ORAL
  Filled 2023-01-17 (×5): qty 1

## 2023-01-17 MED ORDER — PANTOPRAZOLE SODIUM 40 MG PO TBEC
40.0000 mg | DELAYED_RELEASE_TABLET | Freq: Every day | ORAL | Status: DC
  Administered 2023-01-18 – 2023-01-21 (×4): 40 mg via ORAL
  Filled 2023-01-17 (×4): qty 1

## 2023-01-17 MED ORDER — TRAZODONE HCL 50 MG PO TABS
50.0000 mg | ORAL_TABLET | Freq: Every day | ORAL | Status: DC
  Administered 2023-01-17 – 2023-01-20 (×3): 50 mg via ORAL
  Filled 2023-01-17 (×4): qty 1

## 2023-01-17 MED ORDER — LACTATED RINGERS IV SOLN: INTRAVENOUS | Status: DC

## 2023-01-17 MED ORDER — METRONIDAZOLE 500 MG/100ML IV SOLN
500.0000 mg | Freq: Two times a day (BID) | INTRAVENOUS | Status: DC
  Administered 2023-01-18 – 2023-01-21 (×7): 500 mg via INTRAVENOUS
  Filled 2023-01-17 (×7): qty 100

## 2023-01-17 NOTE — Assessment & Plan Note (Addendum)
-  UA was grossly positive with positive nitrite, moderate leukocyte and few bacteria. -has chronic indwelling catheter for chronic urinary retention. Will need catheter exchanged in the morning in case it is difficult to replace with lack of resources overnight.  -continue IV Rocephin  pending urine culture

## 2023-01-17 NOTE — Assessment & Plan Note (Addendum)
 A1c good. - Hold [name] - Start SS corrections

## 2023-01-17 NOTE — Assessment & Plan Note (Signed)
-  creatinine of 1.11 from 0.67 -keep on continuous IV fluid

## 2023-01-17 NOTE — Assessment & Plan Note (Addendum)
 BP elevated - Continue metoprolol - PRN hydralazine

## 2023-01-17 NOTE — ED Provider Notes (Signed)
 [facility] AT [facility] Provider Note   CSN: [id number] Arrival date & time: 01/17/23  1509     History  Chief Complaint  Patient presents with   Hematuria    Troy Randall is a 63 y.o. male.  With a history of acute cholecystitis status post percutaneous drain placement, CVA and type 2 diabetes who presents to the ED for altered mental status.  Beginning 2 or 3 days ago wife began to note acute confusion.  Fever of 103 Fahrenheit at home last night.  She first noticed decreased drainage in patient's percutaneous cholecystotomy collection bag 2 days ago and voiced concern for potential obstruction.  He has also had decreased urinary output from his indwelling Foley catheter during the last 24 hours.  Underwent percutaneous cholecystotomy drain placement on November 14 here with Dr. Adele.  HPI     Home Medications Prior to Admission medications   Medication Sig Start Date End Date Taking? Authorizing Provider  Ascorbic Acid (VITAMIN C) 500 MG CHEW Chew 1 tablet by mouth daily.   Yes [provider]  diphenhydrAMINE  (BENADRYL  ALLERGY) 25 MG tablet Take 25 mg by mouth every 6 (six) hours as needed for allergies.   Yes [provider]  metoprolol  succinate (TOPROL -XL) 25 MG 24 hr tablet Take 0.5 tablets (12.5 mg total) by mouth daily. 04/03/22  Yes Samtani, Jai-Gurmukh, MD  pantoprazole  (PROTONIX ) 40 MG tablet Take 1 tablet (40 mg total) by mouth daily. Patient taking differently: Take 40 mg by mouth daily before breakfast. 11/27/21  Yes Setzer, Sandra J, PA-C  Probiotic Product (PROBIOTIC PO) Take 1 tablet by mouth daily.   Yes [provider]  rosuvastatin  (CRESTOR ) 40 MG tablet Take 1 tablet (40 mg total) by mouth daily. 04/29/21  Yes Lue Elsie BROCKS, MD  traZODone  (DESYREL ) 50 MG tablet Take 0.5-1 tablets (25-50 mg total) by mouth at bedtime as needed for sleep. Patient taking differently: Take 50 mg by mouth at  bedtime. 11/26/21  Yes Setzer, Sandra J, PA-C  TRESIBA  FLEXTOUCH 200 UNIT/ML FlexTouch Pen Inject under skin 15 units twice a day. Patient taking differently: Inject 18 Units into the skin See admin instructions. Inject 18 units into the skin daily- BGL must be 130 or greater 11/26/21  Yes Setzer, Sandra J, PA-C  TYLENOL  500 MG tablet Take 1 tablet (500 mg total) by mouth every 6 (six) hours as needed for mild pain or headache. 04/03/22  Yes Samtani, Jai-Gurmukh, MD  BD VEO INSULIN  SYRINGE U/F 31G X 15/64 1 ML MISC  USE AS DIRECTED 06/08/17   Rolinda Millman, MD  glucose blood (ONETOUCH VERIO) test strip TEST twice a day 05/04/17   Rolinda Millman, MD  Insulin  Pen Needle (NOVOTWIST) 32G X 5 MM MISC Use two daily to inject Victoza  and Toujeo . 04/25/15   [place], MD  methocarbamol  (ROBAXIN ) 500 MG tablet Take 1 tablet (500 mg total) by mouth every 6 (six) hours as needed for muscle spasms. Patient not taking: Reported on 01/17/2023 11/26/21   Setzer, Sandra J, PA-C  nitrofurantoin, macrocrystal-monohydrate, (MACROBID) 100 MG capsule Take 100 mg by mouth 2 (two) times daily. Patient not taking: Reported on 01/17/2023    [provider]  AISHA PASTOR LANCETS 33G MISC Use to check blood sugar once a day dx code E11.65 11/21/14   Von [place], MD  potassium chloride  (KLOR-CON ) 10 MEQ tablet Take 10 mEq by mouth daily. Patient not taking: Reported on 01/17/2023    [provider]      Allergies    Bactrim  [sulfamethoxazole -trimethoprim ] and Amoxicillin     Review of Systems   Review of Systems  Physical Exam Updated Vital Signs BP (!) 187/101   Pulse 84   Temp 98.8 F (37.1 C) (Oral)   Resp (!) 21   Ht 6' (1.829 m)   Wt 98.4 kg   SpO2 100%   BMI 29.42 kg/m  Physical Exam Vitals and nursing note reviewed.  HENT:     Head: Normocephalic and atraumatic.  Eyes:     Pupils: Pupils are equal, round, and reactive to light.  Cardiovascular:     Rate and Rhythm: Normal rate and  regular rhythm.  Pulmonary:     Effort: Pulmonary effort is normal.     Breath sounds: Normal breath sounds.  Abdominal:     Palpations: Abdomen is soft.     Tenderness: There is no abdominal tenderness.     Comments: Percutaneous cholecystotomy drainage tube in collection bag in place Some scant bilious appearing drainage from insertion site No surrounding erythema or fluctuance  Genitourinary:    Comments: Foley catheter in place draining dark urine in collection bag Skin:    General: Skin is warm and dry.  Neurological:     Mental Status: He is alert. He is disoriented.  Psychiatric:        Mood and Affect: Mood normal.     ED Results / Procedures / Treatments   Labs (all labs ordered are listed, but only abnormal results are displayed) Labs Reviewed  COMPREHENSIVE METABOLIC PANEL - Abnormal; Notable for the following components:      Result Value   Potassium 3.0 (*)    Glucose, Bld 145 (*)    Albumin  3.3 (*)    AST 386 (*)    ALT 473 (*)    Alkaline Phosphatase 288 (*)    Total Bilirubin 4.1 (*)    All other components within normal limits  CBC WITH DIFFERENTIAL/PLATELET - Abnormal; Notable for the following components:   WBC 11.5 (*)    HCT 38.7 (*)    Platelets 141 (*)    Neutro Abs 10.3 (*)    Lymphs Abs 0.6 (*)    All other components within normal limits  LACTIC ACID, PLASMA - Abnormal; Notable for the following components:   Lactic Acid, Venous 3.1 (*)    All other components within normal limits  LACTIC ACID, PLASMA - Abnormal; Notable for the following components:   Lactic Acid, Venous 3.0 (*)    All other components within normal limits  URINALYSIS, ROUTINE W REFLEX MICROSCOPIC - Abnormal; Notable for the following components:   Color, Urine AMBER (*)    APPearance CLOUDY (*)    Bilirubin Urine MODERATE (*)    Protein, ur 100 (*)    Nitrite POSITIVE (*)    Leukocytes,Ua MODERATE (*)    Bacteria, UA FEW (*)    Non Squamous Epithelial 0-5 (*)    All  other components within normal limits  CULTURE, BLOOD (ROUTINE X 2)  CULTURE, BLOOD (ROUTINE X 2)  LIPASE, BLOOD  AMMONIA  I-STAT CG4 LACTIC ACID, ED    EKG None  Radiology CT ABDOMEN PELVIS W CONTRAST Result Date: 01/17/2023 CLINICAL DATA:  Abdominal pain, acute, nonlocalized fever, AMS, h/o kidney stones, s/p percutaneous choly drain c/f obstruction EXAM: CT ABDOMEN AND PELVIS WITH CONTRAST TECHNIQUE: Multidetector CT imaging of the abdomen and pelvis was performed using the standard protocol following bolus administration  of intravenous contrast. RADIATION DOSE REDUCTION: This exam was performed according to the departmental dose-optimization program which includes automated exposure control, adjustment of the mA and/or kV according to patient size and/or use of iterative reconstruction technique. CONTRAST:  75mL OMNIPAQUE  IOHEXOL  350 MG/ML SOLN COMPARISON:  Abdominal CT 04/01/2022 FINDINGS: Lower chest: Breathing motion artifact. No confluent airspace disease or pleural effusion allowing for motion. Hepatobiliary: Motion in the upper abdomen limits assessment. Cholecystostomy tube appears to be coiled in the gallbladder fundus. The gallbladder is decompressed. Small gallstones which are not well assessed due to motion. There is mild central intrahepatic biliary ductal dilatation. The common bile duct is poorly assessed on this motion degraded exam, measures approximately 9 mm, series 3, image 34. There is no obvious choledocholithiasis. Pancreas: Fatty atrophy. No evidence of inflammation allowing for motion. Spleen: Normal in size without focal abnormality. Adrenals/Urinary Tract: No adrenal nodule. No hydronephrosis. Nonobstructing stones in the mid right kidney. No evidence of ureteral stone. Foley catheter decompresses the urinary bladder. There are layering stones in the bladder. Mild diffuse bladder wall thickening. Right renal cyst. No further follow-up imaging is recommended. Stomach/Bowel:  The stomach is nondistended. Duodenal diverticula as before. No bowel obstruction. There is no obvious bowel inflammation however motion limits assessment multiple bowel loops. Normal appendix is visualized. Small volume of stool in the colon. Vascular/Lymphatic: Normal caliber abdominal aorta, mild atherosclerosis. Retroaortic left renal vein. Coils in the central small bowel mesentery. Portal vein appears to be patent. No suspicious lymphadenopathy. Reproductive: Unremarkable prostate. Other: No ascites or free air.  Prior umbilical hernia repair. Musculoskeletal: No acute osseous finding allowing for motion artifact limitations IMPRESSION: 1. Cholecystostomy tube appears to be coiled in the gallbladder fundus. The gallbladder is decompressed. Small gallstones which are not well assessed due to motion. 2. Mild central intrahepatic biliary ductal dilatation. The common bile duct is poorly assessed on this motion degraded exam, measures approximately 9 mm. No obvious choledocholithiasis. Recommend correlation with LFTs. Ultrasound could be considered as clinically indicated. MRCP is not recommended for this patient given motion on CT 3. Nonobstructing right renal stones. Layering stones in the bladder. Foley catheter decompresses the urinary bladder. Mild diffuse bladder wall thickening, recommend correlation with urinalysis. Aortic Atherosclerosis (ICD10-I70.0). Electronically Signed   By: Andrea Gasman M.D.   On: 01/17/2023 19:14    Procedures Procedures    Medications Ordered in ED Medications  ceFEPIme  (MAXIPIME ) 2 g in sodium chloride  0.9 % 100 mL IVPB (0 g Intravenous Stopped 01/17/23 1652)    And  metroNIDAZOLE  (FLAGYL ) IVPB 500 mg (0 mg Intravenous Stopped 01/17/23 1758)  sodium chloride  0.9 % bolus 1,000 mL (0 mLs Intravenous Stopped 01/17/23 1844)  iohexol  (OMNIPAQUE ) 350 MG/ML injection 75 mL (75 mLs Intravenous Contrast Given 01/17/23 1856)  sodium chloride  0.9 % bolus 1,000 mL (0 mLs  Intravenous Stopped 01/17/23 2005)    ED Course/ Medical Decision Making/ A&P Clinical Course as of 01/17/23 2022  Sun Jan 17, 2023  1950 Labs notable for elevated lactic acid at 3.1 downtrending to 3.0 on repeat but will obtain third.  UA shows UTI.  Slight leukocytosis of 11.5.  CMP notable for concern for cholestatic pattern with elevation in LFTs  CT shows cholecystotomy tube coiled in gallbladder fundus with a decompressed gallbladder as well as central intrahepatic biliary ductal dilation.  Correlation with LFTs concerning for choledocholithiasis  Discussed with Dr.henn (interventional radiology) we will plan for cholangiogram tomorrow to better evaluate cholecystotomy tube.  Patient may require MRCP  thereafter from GI  Will admit to medicine  Informed patient and his family members of this plan.  He has remained hemodynamically stable [MP]  2021 Discussed with admitting hospitalist accepts for admission [MP]    Clinical Course User Index [MP] Pamella Ozell LABOR, DO                                 Medical Decision Making 63 year old male with history as above including status post percutaneous cholecystotomy drainage placement by IR here (Dr. Adele) on November 14 presenting for concern for drain obstruction.  2 to 3 days of decreased drain output fevers altered mental status.  Afebrile and hypertensive here.  He is confused from baseline.  No new focal neurologic deficits.  Abdomen is soft and nontender on my exam with percutaneous drain in place.  Also concern for dark and decreased urine output from indwelling Foley catheter.  Presentation most concerning for intra-abdominal infection.  Will obtain laboratory workup including cultures lactate ammonia level and UA along with CT abdomen pelvis.  Will cover with broad-spectrum antibiotics.  Once CT is taken will likely need to discuss with IR and plan for admission  Amount and/or Complexity of Data Reviewed Labs: ordered. Radiology:  ordered.  Risk Prescription drug management.           Final Clinical Impression(s) / ED Diagnoses Final diagnoses:  Urinary tract infection with hematuria, site unspecified  Cholecystostomy tube dysfunction, initial encounter  Altered mental status, unspecified altered mental status type  Transaminitis    Rx / DC Orders ED Discharge Orders     None         Pamella Ozell LABOR, DO 01/17/23 2022

## 2023-01-17 NOTE — Assessment & Plan Note (Addendum)
 Hyperbilirubinemia -CT of the abdomen pelvis with contrast demonstrated mild central intrahepatic biliary ductal dilatation.  Common bile duct is poorly assessed due to motion degradation.  No obvious choledocholithiasis. -However elevated LFTs and hyperbilirubinemia concerning for choledocholithiasis and possible cholangitis with reported fever at home, leukocytosis and RUQ pain -unfortunately pt will have too much motion artifact with MRCP unless done under sedation. RUQ ultrasound was attempted but tech could not visual much due to placement of drain and pt discomfort. Message sent in Epic to GI Dr. Rollin to consult for potential ERCP as high likelihood of choledocholithiasis.  -follow LFT trends -acute hepatitis panel pending -hold statin, avoid Tylenol 

## 2023-01-17 NOTE — Assessment & Plan Note (Addendum)
 Supplemented

## 2023-01-17 NOTE — ED Notes (Signed)
 [place] at the bedside

## 2023-01-17 NOTE — ED Triage Notes (Signed)
 Pt here from home via [employer].  Family stated pt's bile drainage bag appears to be clogged. Stated he had a fever of 103 yesterday and that he was "altered" today.    VS Temp 98.1 BP 162/84 Sats 98% Hr 70 Cbg 189

## 2023-01-17 NOTE — H&P (Addendum)
 History and Physical    Patient: Troy Randall FMW:[id number] DOB: 10-15-1960 DOA: 01/17/2023 DOS: the patient was seen and examined on 01/17/2023 PCP: [name], MD  Patient coming from: Home  Chief Complaint:  Chief Complaint  Patient presents with   Hematuria   HPI: Troy Randall is a 63 y.o. male with medical history significant of Chronic urinary retention with indwelling foley, CVA, hx of encephalopathy, T2DM, GI bleed, SVT, prior cholecystitis with cholecystotomy presents with fever and decrease output of biliary drain.   Pt had a complicated hospitalization in August to October of 2023 when he had GI bleed requiring 20 units pRBC transfusion, toxic metabolic encephalopathy consistent with active demyelination requiring plasma exchange. He had had E coli cholecystitis and PERC drain placed on 09/2021 that IR has continued to follow since.  Wife noticed in the past 3 days there has been decrease output. Typically outputs about 250cc daily. Yesterday he also had fever up to 103F, had vomiting, diarrhea and increasing RUQ pain. He is due for exchange of his biliary tube in 2 days.   On arrival to the ED, he was afebrile, hypertensive with BP 163/89 on room air.  CBC with leukocytosis of 11.5, elevated lactate of 3.1.  CMP notable for potassium of 3, AKI with creatinine of 1.11, elevated AST of 386, ALT of 473, alkaline phosphatase of 288, total bilirubin of 4.1.  Lipase within normal limits.  CT of the abdomen pelvis with contrast demonstrated cholecystostomy tube coiled in the gallbladder fundus.  Gallbladder is decompressed.  There is mild central intrahepatic biliary ductal dilatation.  Common bile duct is poorly assessed due to motion degradation.  No obvious choledocholithiasis.  Nonobstructing right renal stone, layering stones in the bladder.  UA was grossly positive with positive nitrite, moderate leukocyte and few bacteria.  ED PA discussed with IR Dr. Florette who will see  in consultation and will likely take patient for cholangiogram.  Patient has been administered IV cefepime  and Flagyl  as well as multiple fluid bolus.  Hospitalist then consulted for admission. Review of Systems: As mentioned in the history of present illness. All other systems reviewed and are negative. Past Medical History:  Diagnosis Date   Anemia    Arthritis    CVA (cerebral vascular accident) (HCC)    last CVA 04/2021   Diabetes mellitus without complication (HCC)    type 2   Hyperlipidemia    Hypertension    Kidney stones    Past Surgical History:  Procedure Laterality Date   ANKLE SURGERY     BIOPSY  09/10/2021   Procedure: BIOPSY;  Surgeon: Rollin Dover, MD;  Location: [facility] ENDOSCOPY;  Service: Gastroenterology;;   BIOPSY  10/07/2021   Procedure: BIOPSY;  Surgeon: Rollin Dover, MD;  Location: [facility] ENDOSCOPY;  Service: Gastroenterology;;   COLONOSCOPY N/A 10/07/2021   Procedure: COLONOSCOPY;  Surgeon: Rollin Dover, MD;  Location: [facility] ENDOSCOPY;  Service: Gastroenterology;  Laterality: N/A;   COLONOSCOPY WITH PROPOFOL  N/A 09/06/2021   Procedure: COLONOSCOPY WITH PROPOFOL ;  Surgeon: Eartha Angelia Sieving, MD;  Location: [facility] ENDO SUITE;  Service: Gastroenterology;  Laterality: N/A;   CYSTOSCOPY/URETEROSCOPY/HOLMIUM LASER/STENT PLACEMENT Left 03/30/2022   Procedure: CYSTOSCOPY/RETROGRADE/URETEROSCOPY/HOLMIUM LASER/STENT PLACEMENT;  Surgeon: Devere Lonni Righter, MD;  Location: [facility] ORS;  Service: Urology;  Laterality: Left;   ENTEROSCOPY N/A 09/10/2021   Procedure: ENTEROSCOPY;  Surgeon: Rollin Dover, MD;  Location: [facility] ENDOSCOPY;  Service: Gastroenterology;  Laterality: N/A;   ENTEROSCOPY  09/06/2021   Procedure: ENTEROSCOPY;  Surgeon:  Eartha Flavors, Toribio, MD;  Location: [facility] ENDO SUITE;  Service: Gastroenterology;;   ESOPHAGOGASTRODUODENOSCOPY (EGD) WITH PROPOFOL   09/06/2021   Procedure: ESOPHAGOGASTRODUODENOSCOPY (EGD) WITH PROPOFOL ;  Surgeon: Eartha Flavors Toribio, MD;   Location: [facility];  Service: Gastroenterology;;   ENID SIGMOIDOSCOPY N/A 09/19/2021   Procedure: ENID MORIN;  Surgeon: Rollin Dover, MD;  Location: [facility] ENDOSCOPY;  Service: Gastroenterology;  Laterality: N/A;   FOREIGN BODY REMOVAL  09/19/2021   Procedure: FOREIGN BODY REMOVAL;  Surgeon: Rollin Dover, MD;  Location: [facility] ENDOSCOPY;  Service: Gastroenterology;;   KRISTENE CAPSULE STUDY  09/06/2021   Procedure: GIVENS CAPSULE STUDY;  Surgeon: Eartha Flavors, Toribio, MD;  Location: [facility] ENDO SU[facility];  Service: Gastroenterology;;   HERNIA REPAIR     umbilical x1 Incisional x1   INCISIONAL HERNIA REPAIR  04/13/2011   Procedure: LAPAROSCOPIC INCISIONAL HERNIA;  Surgeon: Oneil DELENA Budge, MD;  Location: [facility] ORS;  Service: General;  Laterality: N/A;  Recurrent Laparoscopic Incisional Herniorraphy with Mesh   IR ANGIOGRAM SELECTIVE EACH ADDITIONAL VESSEL  09/09/2021   IR ANGIOGRAM VISCERAL SELECTIVE  09/07/2021   IR ANGIOGRAM VISCERAL SELECTIVE  09/06/2021   IR ANGIOGRAM VISCERAL SELECTIVE  09/06/2021   IR CHOLANGIOGRAM EXISTING TUBE  11/06/2021   IR EMBO ART  VEN HEMORR LYMPH EXTRAV  INC GUIDE ROADMAPPING  09/06/2021   IR EXCHANGE BILIARY DRAIN  10/24/2021   IR EXCHANGE BILIARY DRAIN  11/19/2021   IR EXCHANGE BILIARY DRAIN  01/19/2022   IR EXCHANGE BILIARY DRAIN  02/19/2022   IR EXCHANGE BILIARY DRAIN  04/01/2022   IR EXCHANGE BILIARY DRAIN  05/27/2022   IR EXCHANGE BILIARY DRAIN  07/24/2022   IR EXCHANGE BILIARY DRAIN  09/24/2022   IR EXCHANGE BILIARY DRAIN  11/19/2022   IR GUIDED DRAIN W CATHETER PLACEMENT  09/07/2021   IR US  GUIDE BX ASP/DRAIN  09/07/2021   IR US  GUIDE VASC ACCESS RIGHT  09/07/2021   IR US  GUIDE VASC ACCESS RIGHT  09/06/2021   KIDNEY STONE SURGERY     RADIOLOGY WITH ANESTHESIA N/A 03/10/2022   Procedure: MRI WITH ANESTHESIA OF BRAIN WITH AND WITHOUT CONTRAST;  Surgeon: Radiologist, Medication, MD;  Location: [facility] OR;  Service: Radiology;  Laterality: N/A;   Social History:  reports that he  has never smoked. He has quit using smokeless tobacco.  His smokeless tobacco use included snuff. He reports that he does not drink alcohol and does not use drugs.  Allergies  Allergen Reactions   Bactrim  [Sulfamethoxazole -Trimethoprim ] Rash   Amoxicillin  Hives    Tolerated Cefepime     Family History  Problem Relation Age of Onset   Heart failure Mother    Diabetes Father    Cancer Other    Heart attack Other    Anesthesia problems Neg Hx    Hypotension Neg Hx    Malignant hyperthermia Neg Hx    Pseudochol deficiency Neg Hx    Colon cancer Neg Hx    Inflammatory bowel disease Neg Hx     Prior to Admission medications   Medication Sig Start Date End Date Taking? Authorizing Provider  Ascorbic Acid (VITAMIN C) 500 MG CHEW Chew 1 tablet by mouth daily.   Yes [provider]  diphenhydrAMINE  (BENADRYL  ALLERGY) 25 MG tablet Take 25 mg by mouth every 6 (six) hours as needed for allergies.   Yes [provider]  metoprolol  succinate (TOPROL -XL) 25 MG 24 hr tablet Take 0.5 tablets (12.5 mg total) by mouth daily. 04/03/22  Yes Samtani, Jai-Gurmukh, MD  pantoprazole  (  PROTONIX ) 40 MG tablet Take 1 tablet (40 mg total) by mouth daily. Patient taking differently: Take 40 mg by mouth daily before breakfast. 11/27/21  Yes Setzer, Sandra J, PA-C  Probiotic Product (PROBIOTIC PO) Take 1 tablet by mouth daily.   Yes [provider]  rosuvastatin  (CRESTOR ) 40 MG tablet Take 1 tablet (40 mg total) by mouth daily. 04/29/21  Yes Lue Elsie BROCKS, MD  traZODone  (DESYREL ) 50 MG tablet Take 0.5-1 tablets (25-50 mg total) by mouth at bedtime as needed for sleep. Patient taking differently: Take 50 mg by mouth at bedtime. 11/26/21  Yes Setzer, Sandra J, PA-C  TRESIBA  FLEXTOUCH 200 UNIT/ML FlexTouch Pen Inject under skin 15 units twice a day. Patient taking differently: Inject 18 Units into the skin See admin instructions. Inject 18 units into the skin daily- BGL must be 130 or  greater 11/26/21  Yes Setzer, Sandra J, PA-C  TYLENOL  500 MG tablet Take 1 tablet (500 mg total) by mouth every 6 (six) hours as needed for mild pain or headache. 04/03/22  Yes Samtani, Jai-Gurmukh, MD  BD VEO INSULIN  SYRINGE U/F 31G X 15/64 1 ML MISC  USE AS DIRECTED 06/08/17   Rolinda Millman, MD  glucose blood (ONETOUCH VERIO) test strip TEST twice a day 05/04/17   Rolinda Millman, MD  Insulin  Pen Needle (NOVOTWIST) 32G X 5 MM MISC Use two daily to inject Victoza  and Toujeo . 04/25/15   Von [place], MD  methocarbamol  (ROBAXIN ) 500 MG tablet Take 1 tablet (500 mg total) by mouth every 6 (six) hours as needed for muscle spasms. Patient not taking: Reported on 01/17/2023 11/26/21   Setzer, Sandra J, PA-C  nitrofurantoin, macrocrystal-monohydrate, (MACROBID) 100 MG capsule Take 100 mg by mouth 2 (two) times daily. Patient not taking: Reported on 01/17/2023    [provider]  AISHA PASTOR LANCETS 33G MISC Use to check blood sugar once a day dx code E11.65 11/21/14   Von [place], MD  potassium chloride  (KLOR-CON ) 10 MEQ tablet Take 10 mEq by mouth daily. Patient not taking: Reported on 01/17/2023    [provider]    Physical Exam: Vitals:   01/17/23 1756 01/17/23 1927 01/17/23 2145 01/17/23 2148  BP: (!) 178/91 (!) 187/101 (!) 157/88 (!) 157/88  Pulse: 84 84 85 84  Resp: 18 (!) 21 20   Temp: 99.2 F (37.3 C) 98.8 F (37.1 C)    TempSrc: Oral Oral    SpO2: 97% 100% 100%   Weight:      Height:       Constitutional: NAD, ill, uncomfortable appearing male lying in bed with generalized jaundice Eyes: mild bilateral sclera icterus ENMT: Mucous membranes are moist. Neck: normal, supple Respiratory: clear to auscultation bilaterally, no wheezing, no crackles. Normal respiratory effort. No accessory muscle use.  Cardiovascular: Regular rate and rhythm, no murmurs / rubs / gallops. No extremity edema.  Abdomen: soft, right upper quadrant tenderness, brown purulent drainage around  cholecystostomy site-minimal output seen in biliary bag Musculoskeletal: no clubbing / cyanosis. No joint deformity upper and lower extremities. Good ROM, no contractures. Normal muscle tone.  Skin: no rashes, lesions, ulcers. No induration Neurologic: CN 2-12 grossly intact.  Psychiatric: Normal judgment and insight. Alert and oriented x 3. Normal mood.   Data Reviewed:  See HPI  Assessment and Plan: * Sepsis (HCC) -pt reported fever of 103F at home and has leukocytosis with UTI and suspicion for possible cholangitis  -continue IV Rocephin  and Flagyl   -blood culture pending -continuous IV  fluid overnight  UTI (urinary tract infection) -UA was grossly positive with positive nitrite, moderate leukocyte and few bacteria. -has chronic indwelling catheter for chronic urinary retention. Will need catheter exchanged in the morning in case it is difficult to replace with lack of resources overnight.  -continue IV Rocephin  pending urine culture   Transaminitis Hyperbilirubinemia -CT of the abdomen pelvis with contrast demonstrated mild central intrahepatic biliary ductal dilatation.  Common bile duct is poorly assessed due to motion degradation.  No obvious choledocholithiasis. -However elevated LFTs and hyperbilirubinemia concerning for choledocholithiasis and possible cholangitis with reported fever at home, leukocytosis and RUQ pain -unfortunately pt will have too much motion artifact with MRCP unless done under sedation. RUQ ultrasound was attempted but tech could not visual much due to placement of drain and pt discomfort. Message sent in Epic to GI Dr. Rollin to consult for potential ERCP as high likelihood of choledocholithiasis.  -follow LFT trends -acute hepatitis panel pending -hold statin, avoid Tylenol   Hypokalemia -administer oral potassium  Biliary drain displacement -pt had cholecystitis and PERC drain placed following a complicated hospitalization stay since 09/2021 followed  by IR. General surgery has not performed cholecystectomy due to previous abdominal surgeries and his baseline debility. Drain decrease output in the past 3 days. CT of the abdomen pelvis with contrast demonstrated cholecystostomy tube coiled in the gallbladder fundus.  Gallbladder is decompressed.   -IR Dr. Florette consulted and will performed cholangiogram and likely exchange tomorrow  AKI (acute kidney injury) (HCC) -creatinine of 1.11 from 0.67 -keep on continuous IV fluid  Essential hypertension -continue metoprolol   DM2 (diabetes mellitus, type 2) (HCC) -controlled. A1C 6.6 in March 2024.       Advance Care Planning:   Code Status: Full Code   Consults: IR  Family Communication: Wife at bedside  Severity of Illness: The appropriate patient status for this patient is INPATIENT. Inpatient status is judged to be reasonable and necessary in order to provide the required intensity of service to ensure the patient's safety. The patient's presenting symptoms, physical exam findings, and initial radiographic and laboratory data in the context of their chronic comorbidities is felt to place them at high risk for further clinical deterioration. Furthermore, it is not anticipated that the patient will be medically stable for discharge from the hospital within 2 midnights of admission.   * I certify that at the point of admission it is my clinical judgment that the patient will require inpatient hospital care spanning beyond 2 midnights from the point of admission due to high intensity of service, high risk for further deterioration and high frequency of surveillance required.*  Author: Alfrieda ONEIDA Pillar, DO 01/17/2023 10:24 PM  For on call review [email or web address].

## 2023-01-17 NOTE — Assessment & Plan Note (Addendum)
 Presented with fever, leukocytosis, and SOFA score 4 (thrombocytopenia, decreased mentation, and elevated bilirubin).  Suspected source gallbladder over urinary tract - Exchange GB tube - Consult GI for ERCP - Exchange foley and send culture - Follow blood cultures - Continue Rocephin  and Flagyl 

## 2023-01-17 NOTE — Assessment & Plan Note (Addendum)
-   Consult IR for exchange - Needs to reestablish with Gen Surg

## 2023-01-18 ENCOUNTER — Other Ambulatory Visit: Payer: Self-pay | Admitting: Radiology

## 2023-01-18 ENCOUNTER — Inpatient Hospital Stay (HOSPITAL_COMMUNITY): Payer: Medicare HMO

## 2023-01-18 DIAGNOSIS — T85520A Displacement of bile duct prosthesis, initial encounter: Secondary | ICD-10-CM | POA: Diagnosis not present

## 2023-01-18 HISTORY — PX: IR EXCHANGE BILIARY DRAIN: IMG6046

## 2023-01-18 LAB — COMPREHENSIVE METABOLIC PANEL
ALT: 288 U/L — ABNORMAL HIGH (ref 0–44)
AST: 177 U/L — ABNORMAL HIGH (ref 15–41)
Albumin: 2.7 g/dL — ABNORMAL LOW (ref 3.5–5.0)
Alkaline Phosphatase: 230 U/L — ABNORMAL HIGH (ref 38–126)
Anion gap: 9 (ref 5–15)
BUN: 10 mg/dL (ref 6–20)
CO2: 25 mmol/L (ref 22–32)
Calcium: 8.1 mg/dL — ABNORMAL LOW (ref 8.9–10.3)
Chloride: 102 mmol/L (ref 98–111)
Creatinine, Ser: 0.78 mg/dL (ref 0.61–1.24)
GFR, Estimated: >60 mL/min (ref >60)
Glucose, Bld: 127 mg/dL — ABNORMAL HIGH (ref 70–99)
Potassium: 2.9 mmol/L — ABNORMAL LOW (ref 3.5–5.1)
Sodium: 136 mmol/L (ref 135–145)
Total Bilirubin: 1.8 mg/dL — ABNORMAL HIGH (ref 0.0–1.2)
Total Protein: 5.8 g/dL — ABNORMAL LOW (ref 6.5–8.1)

## 2023-01-18 LAB — CBC
HCT: 33 % — ABNORMAL LOW (ref 39.0–52.0)
Hemoglobin: 11.2 g/dL — ABNORMAL LOW (ref 13.0–17.0)
MCH: 28.6 pg (ref 26.0–34.0)
MCHC: 33.9 g/dL (ref 30.0–36.0)
MCV: 84.4 fL (ref 80.0–100.0)
Platelets: 117 10*3/uL — ABNORMAL LOW (ref 150–400)
RBC: 3.91 MIL/uL — ABNORMAL LOW (ref 4.22–5.81)
RDW: 13.6 % (ref 11.5–15.5)
WBC: 8.5 10*3/uL (ref 4.0–10.5)
nRBC: 0 % (ref 0.0–0.2)

## 2023-01-18 LAB — HEPATITIS PANEL, ACUTE: Hepatitis B Surface Ag: NONREACTIVE

## 2023-01-18 LAB — GLUCOSE, CAPILLARY
Glucose-Capillary: 124 mg/dL — ABNORMAL HIGH (ref 70–99)
Glucose-Capillary: 119 mg/dL — ABNORMAL HIGH (ref 70–99)

## 2023-01-18 MED ORDER — SILVER NITRATE-POT NITRATE 75-25 % EX MISC
2.0000 | Freq: Once | CUTANEOUS | Status: DC
  Filled 2023-01-18: qty 2

## 2023-01-18 MED ORDER — HYDRALAZINE HCL 25 MG PO TABS
25.0000 mg | ORAL_TABLET | Freq: Three times a day (TID) | ORAL | Status: DC | PRN
  Administered 2023-01-18 – 2023-01-19 (×2): 25 mg via ORAL
  Filled 2023-01-18 (×2): qty 1

## 2023-01-18 MED ORDER — SILVER NITRATE-POT NITRATE 75-25 % EX MISC
1.0000 | CUTANEOUS | Status: DC | PRN
  Filled 2023-01-18 (×2): qty 1

## 2023-01-18 MED ORDER — POTASSIUM CHLORIDE CRYS ER 20 MEQ PO TBCR
40.0000 meq | EXTENDED_RELEASE_TABLET | Freq: Two times a day (BID) | ORAL | Status: AC
  Administered 2023-01-18 (×2): 40 meq via ORAL
  Filled 2023-01-18 (×2): qty 2

## 2023-01-18 MED ORDER — INSULIN ASPART 100 UNIT/ML IJ SOLN
0.0000 [IU] | Freq: Three times a day (TID) | INTRAMUSCULAR | Status: DC
  Administered 2023-01-19: 2 [IU] via SUBCUTANEOUS
  Administered 2023-01-20: 3 [IU] via SUBCUTANEOUS
  Administered 2023-01-20 – 2023-01-21 (×3): 2 [IU] via SUBCUTANEOUS
  Administered 2023-01-21: 3 [IU] via SUBCUTANEOUS

## 2023-01-18 MED ORDER — POTASSIUM CHLORIDE 2 MEQ/ML IV SOLN
INTRAVENOUS | Status: AC
  Filled 2023-01-18 (×2): qty 1000

## 2023-01-18 MED ORDER — LIDOCAINE-EPINEPHRINE 1 %-1:100000 IJ SOLN
10.0000 mL | Freq: Once | INTRAMUSCULAR | Status: AC
  Administered 2023-01-18: 10 mL via INTRADERMAL

## 2023-01-18 MED ORDER — CHLORHEXIDINE GLUCONATE CLOTH 2 % EX PADS
6.0000 | MEDICATED_PAD | Freq: Every day | CUTANEOUS | Status: DC
  Administered 2023-01-19 – 2023-01-21 (×3): 6 via TOPICAL

## 2023-01-18 MED ORDER — IOHEXOL 300 MG/ML  SOLN
50.0000 mL | Freq: Once | INTRAMUSCULAR | Status: AC | PRN
  Administered 2023-01-18: 15 mL

## 2023-01-18 MED ORDER — LIDOCAINE-EPINEPHRINE 1 %-1:100000 IJ SOLN
INTRAMUSCULAR | Status: AC
Start: 2023-01-18 — End: ?
  Filled 2023-01-18: qty 1

## 2023-01-18 MED ORDER — INSULIN ASPART 100 UNIT/ML IJ SOLN: 0.0000 [IU] | Freq: Every day | INTRAMUSCULAR | Status: DC

## 2023-01-18 NOTE — Consult Note (Signed)
 Reason for Consult:Elevated liver enzymes. Referring Physician: THP  Troy Randall is an 63 y.o. male.  HPI: 63 year old white male with multiple medical problems listed below, brought in to the ER by his wife for altered mental status and disorientation. In the ER he was noted to have decreased output vis the cholecystostomy drain placed for acute cholecystitis, on 11/19/22 and he also had a UTI. On admission, he was noted to have an alkaline phosphatase of 288 , an AST/ALT of 386/473 and TB of 4.1. Today his TB is 1.8 with an alkaline phosphatase of 230, AST/ALT of 177/288. Patient denies having any chills or rigors. He had a fever of 102 F 3 days ago but has been afebrile since then. He denied having any abdominal pain, diarrhea, constipation, melena or hematochezia. CT scan on admission revealed the cholecystostomy tube colied in the fundus of the gallbladder along with mild intrahepatic ductal dilatation along with no obvious cholelithiasis; non-obstructing right renal stones and layering of stones in the urinary bladder with a thickened bladder wall. He had a colonoscopy done on 10/07/21 when an ulcer was noted in the sigmoid colon. He had an enteroscopy done on 09/10/21 when jejunal ulcers were noted. He was noted to have a hiatal hernia and gastric erosions on an EGD done on 09/06/21.   Past Medical History:  Diagnosis Date   Anemia    Arthritis    ?Disseminated encephalomyelitis     GERD/hiatal hernia   last CVA 04/2021   Diabetes mellitus without complication (HCC)    type 2   Hyperlipidemia    Hypertension    Kidney stones    Past Surgical History:  Procedure Laterality Date   ANKLE SURGERY     BIOPSY  09/10/2021   Procedure: BIOPSY;  Surgeon: Rollin Dover, MD;  Location: [facility] ENDOSCOPY;  Service: Gastroenterology;;   BIOPSY  10/07/2021   Procedure: BIOPSY;  Surgeon: Rollin Dover, MD;  Location: [facility] ENDOSCOPY;  Service: Gastroenterology;;   COLONOSCOPY N/A 10/07/2021   Procedure:  COLONOSCOPY;  Surgeon: Rollin Dover, MD;  Location: [facility] ENDOSCOPY;  Service: Gastroenterology;  Laterality: N/A;   COLONOSCOPY WITH PROPOFOL  N/A 09/06/2021   Procedure: COLONOSCOPY WITH PROPOFOL ;  Surgeon: Eartha Angelia Sieving, MD;  Location: [facility] ENDO SUITE;  Service: Gastroenterology;  Laterality: N/A;   CYSTOSCOPY/URETEROSCOPY/HOLMIUM LASER/STENT PLACEMENT Left 03/30/2022   Procedure: CYSTOSCOPY/RETROGRADE/URETEROSCOPY/HOLMIUM LASER/STENT PLACEMENT;  Surgeon: Devere Lonni Righter, MD;  Location: [facility] ORS;  Service: Urology;  Laterality: Left;   ENTEROSCOPY N/A 09/10/2021   Procedure: ENTEROSCOPY;  Surgeon: Rollin Dover, MD;  Location: [facility] ENDOSCOPY;  Service: Gastroenterology;  Laterality: N/A;   ENTEROSCOPY  09/06/2021   Procedure: ENTEROSCOPY;  Surgeon: Eartha Angelia Sieving, MD;  Location: [facility] ENDO SUITE;  Service: Gastroenterology;;   ESOPHAGOGASTRODUODENOSCOPY (EGD) WITH PROPOFOL   09/06/2021   Procedure: ESOPHAGOGASTRODUODENOSCOPY (EGD) WITH PROPOFOL ;  Surgeon: Eartha Angelia Sieving, MD;  Location: [facility] END[facility] SU[facility];  Service: Gastroenterology;;   ENID SIGMOIDOSCOPY N/A 09/19/2021   Procedure: ENID MORIN;  Surgeon: Rollin Dover, MD;  Location: [facility] ENDOSCOPY;  Service: Gastroenterology;  Laterality: N/A;   FOREIGN BODY REMOVAL  09/19/2021   Procedure: FOREIGN BODY REMOVAL;  Surgeon: Rollin Dover, MD;  Location: [facility] ENDOSCOPY;  Service: Gastroenterology;;   KRISTENE CAPSULE STUDY  09/06/2021   Procedure: GIVENS CAPSULE STUDY;  Surgeon: Eartha Angelia Sieving, MD;  Location: [facility] END[facility];  Service: Gastroenterology;;   HERNIA REPAIR     umbilical x1 Incisional x1   INCISIONAL HERNIA REPAIR  04/13/2011   Procedure:  LAPAROSCOPIC INCISIONAL HERNIA;  Surgeon: Oneil DELENA Budge, MD;  Location: [facility] ORS;  Service: General;  Laterality: N/A;  Recurrent Laparoscopic Incisional Herniorraphy with Mesh   IR ANGIOGRAM SELECTIVE EACH ADDITIONAL VESSEL  09/09/2021   IR ANGIOGRAM VISCERAL SELECTIVE   09/07/2021   IR ANGIOGRAM VISCERAL SELECTIVE  09/06/2021   IR ANGIOGRAM VISCERAL SELECTIVE  09/06/2021   IR CHOLANGIOGRAM EXISTING TUBE  11/06/2021   IR EMBO ART  VEN HEMORR LYMPH EXTRAV  INC GUIDE ROADMAPPING  09/06/2021   IR EXCHANGE BILIARY DRAIN  10/24/2021   IR EXCHANGE BILIARY DRAIN  11/19/2021   IR EXCHANGE BILIARY DRAIN  01/19/2022   IR EXCHANGE BILIARY DRAIN  02/19/2022   IR EXCHANGE BILIARY DRAIN  04/01/2022   IR EXCHANGE BILIARY DRAIN  05/27/2022   IR EXCHANGE BILIARY DRAIN  07/24/2022   IR EXCHANGE BILIARY DRAIN  09/24/2022   IR EXCHANGE BILIARY DRAIN  11/19/2022   IR GUIDED DRAIN W CATHETER PLACEMENT  09/07/2021   IR US  GUIDE BX ASP/DRAIN  09/07/2021   IR US  GUIDE VASC ACCESS RIGHT  09/07/2021   IR US  GUIDE VASC ACCESS RIGHT  09/06/2021   KIDNEY STONE SURGERY     RADIOLOGY WITH ANESTHESIA N/A 03/10/2022   Procedure: MRI WITH ANESTHESIA OF BRAIN WITH AND WITHOUT CONTRAST;  Surgeon: Radiologist, Medication, MD;  Location: [facility] OR;  Service: Radiology;  Laterality: N/A;   Family History  Problem Relation Age of Onset   Heart failure Mother    Diabetes Father    Cancer Other    Heart attack Other    Anesthesia problems Neg Hx    Hypotension Neg Hx    Malignant hyperthermia Neg Hx    Pseudochol deficiency Neg Hx    Colon cancer Neg Hx    Inflammatory bowel disease Neg Hx    Social History:  reports that he has never smoked. He has quit using smokeless tobacco.  His smokeless tobacco use included snuff. He reports that he does not drink alcohol and does not use drugs.  Allergies:  Allergies  Allergen Reactions   Bactrim  [Sulfamethoxazole -Trimethoprim ] Rash   Amoxicillin  Hives    Tolerated Cefepime    Medications: I have reviewed the patient's current medications. Prior to Admission: (Not in a hospital admission)  Scheduled:  metoprolol  succinate  12.5 mg Oral Daily   pantoprazole   40 mg Oral QAC breakfast   potassium chloride   40 mEq Oral BID   silver  nitrate applicators  2 Stick Topical  Once   traZODone   50 mg Oral QHS   Continuous:  cefTRIAXone  (ROCEPHIN )  IV Stopped (01/18/23 0003)   lactated ringers  1,000 mL with potassium chloride  20 mEq infusion 100 mL/hr at 01/18/23 0833   metronidazole  Stopped (01/18/23 0506)   PRN:morphine  injection, silver  nitrate applicators  Results for orders placed or performed during the hospital encounter of 01/17/23 (from the past 48 hours)  Comprehensive metabolic panel     Status: Abnormal   Collection Time: 01/17/23  4:09 PM  Result Value Ref Range   Sodium 138 135 - 145 mmol/L   Potassium 3.0 (L) 3.5 - 5.1 mmol/L   Chloride 99 98 - 111 mmol/L   CO2 26 22 - 32 mmol/L   Glucose, Bld 145 (H) 70 - 99 mg/dL    Comment: Glucose reference range applies only to samples taken after fasting for at least 8 hours.   BUN 14 6 - 20 mg/dL   Creatinine, Ser 8.88 0.61 - 1.24 mg/dL   Calcium  9.0 8.9 -  10.3 mg/dL   Total Protein 6.9 6.5 - 8.1 g/dL   Albumin  3.3 (L) 3.5 - 5.0 g/dL   AST 613 (H) 15 - 41 U/L   ALT 473 (H) 0 - 44 U/L   Alkaline Phosphatase 288 (H) 38 - 126 U/L   Total Bilirubin 4.1 (H) 0.0 - 1.2 mg/dL   GFR, Estimated >39 >39 mL/min    Comment: (NOTE) Calculated using the CKD-EPI Creatinine Equation (2021)    Anion gap 13 5 - 15    Comment: Performed at [facility], [address] [place], [place] [address]  Lipase, blood     Status: None   Collection Time: 01/17/23  4:09 PM  Result Value Ref Range   Lipase 49 11 - 51 U/L    Comment: Performed at [facility], [address], [place], [place] [address]  CBC with Differential     Status: Abnormal   Collection Time: 01/17/23  4:09 PM  Result Value Ref Range   WBC 11.5 (H) 4.0 - 10.5 K/uL   RBC 4.58 4.22 - 5.81 MIL/uL   Hemoglobin 13.1 13.0 - 17.0 g/dL   HCT 61.2 (L) 60.9 - 47.9 %   MCV 84.5 80.0 - 100.0 fL   MCH 28.6 26.0 - 34.0 pg   MCHC 33.9 30.0 - 36.0 g/dL   RDW 86.3 88.4 - 84.4 %   Platelets 141 (L) 150 - 400 K/uL   nRBC 0.0 0.0 - 0.2 %    Neutrophils Relative % 91 %   Neutro Abs 10.3 (H) 1.7 - 7.7 K/uL   Lymphocytes Relative 5 %   Lymphs Abs 0.6 (L) 0.7 - 4.0 K/uL   Monocytes Relative 3 %   Monocytes Absolute 0.4 0.1 - 1.0 K/uL   Eosinophils Relative 1 %   Eosinophils Absolute 0.1 0.0 - 0.5 K/uL   Basophils Relative 0 %   Basophils Absolute 0.0 0.0 - 0.1 K/uL   Immature Granulocytes 0 %   Abs Immature Granulocytes 0.04 0.00 - 0.07 K/uL    Comment: Performed at [facility], [address] [place], [place] [address]  Culture, blood (routine x 2)     Status: None (Preliminary result)   Collection Time: 01/17/23  4:09 PM   Specimen: BLOOD  Result Value Ref Range   Specimen Description BLOOD BLOOD LEFT ARM    Special Requests      BOTTLES DRAWN AEROBIC AND ANAEROBIC Blood Culture adequate volume   Culture      NO GROWTH < 12 HOURS Performed at [facility], [address], [place], [place] [address]    Report Status PENDING   Lactic acid, plasma     Status: Abnormal   Collection Time: 01/17/23  4:09 PM  Result Value Ref Range   Lactic Acid, Venous 3.1 (HH) 0.5 - 1.9 mmol/L    Comment: CRITICAL RESULT CALLED TO, READ BACK BY AND VERIFIED WITH EMERSON Idol RN , @1633 , 01/17/23, [place], T. Performed at [facility], [address], [place], [place] [address]   Ammonia     Status: None   Collection Time: 01/17/23  4:09 PM  Result Value Ref Range   Ammonia 12 9 - 35 umol/L    Comment: Performed at [facility], [address] [place], [place] [address]  Culture, blood (routine x 2)     Status: None (Preliminary result)   Collection Time: 01/17/23  4:10 PM   Specimen:  BLOOD  Result Value Ref Range   Specimen Description BLOOD RIGHT ANTECUBITAL    Special Requests      AEROBIC BOTTLE ONLY Blood Culture results may not be optimal due to an inadequate volume of blood received in culture bottles   Culture      NO GROWTH < 12 HOURS Performed at [facility], [address] [place],  [place] [address]    Report Status PENDING   Urinalysis, Routine w reflex microscopic -Urine, Catheterized; Indwelling urinary catheter     Status: Abnormal   Collection Time: 01/17/23  4:37 PM  Result Value Ref Range   Color, Urine AMBER (A) YELLOW    Comment: BIOCHEMICALS MAY BE AFFECTED BY COLOR   APPearance CLOUDY (A) CLEAR   Specific Gravity, Urine 1.028 1.005 - 1.030   pH 5.0 5.0 - 8.0   Glucose, UA NEGATIVE NEGATIVE mg/dL   Hgb urine dipstick NEGATIVE NEGATIVE   Bilirubin Urine MODERATE (A) NEGATIVE   Ketones, ur NEGATIVE NEGATIVE mg/dL   Protein, ur 899 (A) NEGATIVE mg/dL   Nitrite POSITIVE (A) NEGATIVE   Leukocytes,Ua MODERATE (A) NEGATIVE   RBC / HPF 0-5 0 - 5 RBC/hpf   WBC, UA >50 0 - 5 WBC/hpf   Bacteria, UA FEW (A) NONE SEEN   Squamous Epithelial / HPF 0-5 0 - 5 /HPF   WBC Clumps PRESENT    Mucus PRESENT    Granular Casts, UA PRESENT    Amorphous Crystal PRESENT    Non Squamous Epithelial 0-5 (A) NONE SEEN    Comment: Performed at [facility], [address] [place], [place] [address]  Lactic acid, plasma     Status: Abnormal   Collection Time: 01/17/23  5:39 PM  Result Value Ref Range   Lactic Acid, Venous 3.0 (HH) 0.5 - 1.9 mmol/L    Comment: CRITICAL VALUE NOTED. VALUE IS CONSISTENT WITH PREVIOUSLY REPORTED/CALLED VALUE Performed at [facility], [address] [place], [place] [address]   I-Stat CG4 Lactic Acid     Status: None   Collection Time: 01/17/23  8:20 PM  Result Value Ref Range   Lactic Acid, Venous 1.4 0.5 - 1.9 mmol/L  Hepatitis panel, acute     Status: None   Collection Time: 01/17/23 10:24 PM  Result Value Ref Range   Hepatitis B Surface Ag NON REACTIVE NON REACTIVE   HCV Ab NON REACTIVE NON REACTIVE    Comment: (NOTE) Nonreactive HCV antibody screen is consistent with no HCV infections,  unless recent infection is suspected or other evidence exists to indicate HCV infection.     Hep A IgM NON REACTIVE NON REACTIVE   Hep B C IgM  NON REACTIVE NON REACTIVE    Comment: Performed at [facility], [address] [place], [place] [address]  Comprehensive metabolic panel     Status: Abnormal   Collection Time: 01/18/23  4:05 AM  Result Value Ref Range   Sodium 136 135 - 145 mmol/L   Potassium 2.9 (L) 3.5 - 5.1 mmol/L   Chloride 102 98 - 111 mmol/L   CO2 25 22 - 32 mmol/L   Glucose, Bld 127 (H) 70 - 99 mg/dL    Comment: Glucose reference range applies only to samples taken after fasting for at least 8 hours.   BUN 10 6 - 20 mg/dL   Creatinine, Ser 9.21 0.61 - 1.24 mg/dL   Calcium  8.1 (L) 8.9 - 10.3 mg/dL  Total Protein 5.8 (L) 6.5 - 8.1 g/dL   Albumin  2.7 (L) 3.5 - 5.0 g/dL   AST 822 (H) 15 - 41 U/L   ALT 288 (H) 0 - 44 U/L   Alkaline Phosphatase 230 (H) 38 - 126 U/L   Total Bilirubin 1.8 (H) 0.0 - 1.2 mg/dL   GFR, Estimated >39 >39 mL/min    Comment: (NOTE) Calculated using the CKD-EPI Creatinine Equation (2021)    Anion gap 9 5 - 15    Comment: Performed at [facility], [address] [place], [place] [address]  CBC     Status: Abnormal   Collection Time: 01/18/23  4:05 AM  Result Value Ref Range   WBC 8.5 4.0 - 10.5 K/uL   RBC 3.91 (L) 4.22 - 5.81 MIL/uL   Hemoglobin 11.2 (L) 13.0 - 17.0 g/dL   HCT 66.9 (L) 60.9 - 47.9 %   MCV 84.4 80.0 - 100.0 fL   MCH 28.6 26.0 - 34.0 pg   MCHC 33.9 30.0 - 36.0 g/dL   RDW 86.3 88.4 - 84.4 %   Platelets 117 (L) 150 - 400 K/uL    Comment: REPEATED TO VERIFY   nRBC 0.0 0.0 - 0.2 %    Comment: Performed at [facility], [address] [place], [place] [address]   US  Abdomen Limited RUQ (LIVER/GB) Result Date: 01/17/2023 CLINICAL DATA:  151471 RUQ pain 151471 history of cholecystitis with cholecystectomy injuring. Concern for cholangitis. EXAM: ULTRASOUND ABDOMEN LIMITED RIGHT UPPER QUADRANT COMPARISON:  None Available. FINDINGS: Nondiagnostic images obtained due to placement of drain, bandaging, patient pain as per ultrasound tech. IMPRESSION:  Nondiagnostic images obtained due to placement of drain, bandaging, patient pain as per ultrasound tech. Electronically Signed   By: Morgane  Naveau M.D.   On: 01/17/2023 22:52   CT ABDOMEN PELVIS W CONTRAST Result Date: 01/17/2023 CLINICAL DATA:  Abdominal pain, acute, nonlocalized fever, AMS, h/o kidney stones, s/p percutaneous choly drain c/f obstruction EXAM: CT ABDOMEN AND PELVIS WITH CONTRAST TECHNIQUE: Multidetector CT imaging of the abdomen and pelvis was performed using the standard protocol following bolus administration of intravenous contrast. RADIATION DOSE REDUCTION: This exam was performed according to the departmental dose-optimization program which includes automated exposure control, adjustment of the mA and/or kV according to patient size and/or use of iterative reconstruction technique. CONTRAST:  75mL OMNIPAQUE  IOHEXOL  350 MG/ML SOLN COMPARISON:  Abdominal CT 04/01/2022 FINDINGS: Lower chest: Breathing motion artifact. No confluent airspace disease or pleural effusion allowing for motion. Hepatobiliary: Motion in the upper abdomen limits assessment. Cholecystostomy tube appears to be coiled in the gallbladder fundus. The gallbladder is decompressed. Small gallstones which are not well assessed due to motion. There is mild central intrahepatic biliary ductal dilatation. The common bile duct is poorly assessed on this motion degraded exam, measures approximately 9 mm, series 3, image 34. There is no obvious choledocholithiasis. Pancreas: Fatty atrophy. No evidence of inflammation allowing for motion. Spleen: Normal in size without focal abnormality. Adrenals/Urinary Tract: No adrenal nodule. No hydronephrosis. Nonobstructing stones in the mid right kidney. No evidence of ureteral stone. Foley catheter decompresses the urinary bladder. There are layering stones in the bladder. Mild diffuse bladder wall thickening. Right renal cyst. No further follow-up imaging is recommended. Stomach/Bowel: The  stomach is nondistended. Duodenal diverticula as before. No bowel obstruction. There is no obvious bowel inflammation however motion limits assessment multiple bowel loops. Normal appendix is visualized. Small volume of stool in the colon. Vascular/Lymphatic: Normal caliber  abdominal aorta, mild atherosclerosis. Retroaortic left renal vein. Coils in the central small bowel mesentery. Portal vein appears to be patent. No suspicious lymphadenopathy. Reproductive: Unremarkable prostate. Other: No ascites or free air.  Prior umbilical hernia repair. Musculoskeletal: No acute osseous finding allowing for motion artifact limitations IMPRESSION: 1. Cholecystostomy tube appears to be coiled in the gallbladder fundus. The gallbladder is decompressed. Small gallstones which are not well assessed due to motion. 2. Mild central intrahepatic biliary ductal dilatation. The common bile duct is poorly assessed on this motion degraded exam, measures approximately 9 mm. No obvious choledocholithiasis. Recommend correlation with LFTs. Ultrasound could be considered as clinically indicated. MRCP is not recommended for this patient given motion on CT 3. Nonobstructing right renal stones. Layering stones in the bladder. Foley catheter decompresses the urinary bladder. Mild diffuse bladder wall thickening, recommend correlation with urinalysis. Aortic Atherosclerosis (ICD10-I70.0). Electronically Signed   By: Andrea Gasman M.D.   On: 01/17/2023 19:14   Review of Systems  Constitutional:  Positive for activity change, fatigue and fever. Negative for appetite change, chills, diaphoresis and unexpected weight change.  HENT: Negative.    Eyes: Negative.   Cardiovascular: Negative.   Endocrine: Negative.   Genitourinary:  Positive for difficulty urinating.  Musculoskeletal:  Positive for arthralgias and myalgias.  Allergic/Immunologic: Negative.   Neurological:  Positive for speech difficulty and weakness. Negative for dizziness,  facial asymmetry, light-headedness, numbness and headaches.  Hematological: Negative.   Psychiatric/Behavioral:  Positive for decreased concentration.    Blood pressure (!) 154/92, pulse 75, temperature 98.7 F (37.1 C), temperature source Oral, resp. rate 15, height 6' (1.829 m), weight 98.4 kg, SpO2 98%. Physical Exam Constitutional:      General: He is in acute distress.     Appearance: He is not ill-appearing or diaphoretic.  HENT:     Head: Normocephalic and atraumatic.     Mouth/Throat:     Mouth: Mucous membranes are dry.  Eyes:     Extraocular Movements: Extraocular movements intact.     Pupils: Pupils are equal, round, and reactive to light.  Cardiovascular:     Rate and Rhythm: Normal rate and regular rhythm.  Pulmonary:     Effort: Pulmonary effort is normal.     Breath sounds: Normal breath sounds.  Abdominal:     General: Bowel sounds are normal. There is no distension.     Tenderness: There is no abdominal tenderness. There is no guarding.  Skin:    General: Skin is warm and dry.  Neurological:     Mental Status: He is alert and oriented to person, place, and time.  Psychiatric:        Mood and Affect: Mood normal.        Behavior: Behavior normal.        Thought Content: Thought content normal.        Judgment: Judgment normal.   Assessment/Plan: 1) Elevated liver enzymes-improved since admission-he may need an MRCP with sedation if LFT's do not continue to improve.  2) Acute cholecystitis-s/p cholecystostomy drain placed on 11/19/22-cholangiogram planned for tomorrow with an exchange as the output has decreased significantly . 3) UTI in the setting of a chronic indwelling catheter?sepsis-agree with Rocephin  and Flagyl . 4) Acute kidney injury. 5) HTN/hyperlipidemia-statins are on hold for now. 6) AODM. 7) GERD/Hiatal hernia.  Renaye Sous 01/18/2023, 1:22 PM

## 2023-01-18 NOTE — Assessment & Plan Note (Addendum)
 This is followed by Dr. Epimenio Foot.  Exact etiology unclear.  ADEM favored over MS. Not on disease specific therapy at present.

## 2023-01-18 NOTE — Hospital Course (Signed)
 63 y.o. 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  In the ER, CT showed no obvious source, other than known cholecystostomy tube and foley.  Tbili 4.1 and AST/ALT >300.  Started on Rocephin /Flagyl  and admitted for IR drain exchange.

## 2023-01-18 NOTE — Assessment & Plan Note (Signed)
-   Hold statin given LFTs

## 2023-01-18 NOTE — Plan of Care (Signed)
  Problem: Education: Goal: Knowledge of General Education information will improve Description: Including pain rating scale, medication(s)/side effects and non-pharmacologic comfort measures Outcome: Progressing   Problem: Health Behavior/Discharge Planning: Goal: Ability to manage health-related needs will improve Outcome: Progressing   Problem: Clinical Measurements: Goal: Ability to maintain clinical measurements within normal limits will improve Outcome: Progressing Goal: Will remain free from infection Outcome: Progressing   Problem: Activity: Goal: Risk for activity intolerance will decrease Outcome: Progressing   Problem: Nutrition: Goal: Adequate nutrition will be maintained Outcome: Progressing   Problem: Coping: Goal: Level of anxiety will decrease Outcome: Progressing   Problem: Elimination: Goal: Will not experience complications related to bowel motility Outcome: Progressing Goal: Will not experience complications related to urinary retention Outcome: Progressing   Problem: Pain Management: Goal: General experience of comfort will improve Outcome: Progressing   Problem: Safety: Goal: Ability to remain free from injury will improve Outcome: Progressing   Problem: Skin Integrity: Goal: Risk for impaired skin integrity will decrease Outcome: Progressing

## 2023-01-18 NOTE — Assessment & Plan Note (Signed)
-   Hold Crestor given transaminitis

## 2023-01-18 NOTE — Progress Notes (Signed)
 8084- Patient received fully soaked with bile drain on his right side per [facility] first noticed when they changed foley catheter.   2015- dressing not changed by [facility], this nurse changed dressing and cleaned patient up, noticed redness on insertion site and leaking on site, of note no drain noticed on bile bag, flushed with 10 ml and no output noted still. Paged Dr. Shona about findings.

## 2023-01-18 NOTE — Progress Notes (Signed)
  Progress Note   Patient: Troy Randall FMW:[id number] DOB: [birth year] DOA: 01/17/2023     1 DOS: the patient was seen and examined on 01/18/2023 at 11:25AM      Brief hospital course: 63 y.o. 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  In the ER, CT showed no obvious source, other than known cholecystostomy tube and foley.  Tbili 4.1 and AST/ALT >300.  Started on Rocephin /Flagyl  and admitted for IR drain exchange.     Assessment and Plan: * Sepsis (HCC) Presented with fever, leukocytosis, and SOFA score 4 (thrombocytopenia, decreased mentation, and elevated bilirubin).  Suspected source gallbladder over urinary tract - Exchange GB tube - Consult GI for ERCP - Exchange foley and send culture - Follow blood cultures - Continue Rocephin  and Flagyl     Biliary drain displacement - Consult IR for exchange - Needs to reestablish with Gen Surg  Acute disseminated encephalomyelitis This is followed by Dr. Vear.  Exact etiology unclear.  ADEM favored over MS. Not on disease specific therapy at present.  Hyperlipidemia - Hold Crestor  given transaminitis  Hypokalemia Supplemented  Cerebrovascular disease - Hold statin given LFTs  Essential hypertension BP elevated - Continue metoprolol  - PRN hydralazine   DM2 (diabetes mellitus, type 2) (HCC) A1c good. - Hold Tresiba  - Start SS corrections          Subjective: Patient still somewhat out of it and confused, feels generalized malaise, tired.  Sore from where his drain was exchanged, which happened earlier today.  No vomiting, no suprapubic pain or change in urinary symptoms.     Physical Exam: BP (!) 168/80 (BP Location:  Right Arm)   Pulse 60   Temp 98.5 F (36.9 C) (Oral)   Resp 18   Ht 6' (1.829 m)   Wt 98.4 kg   SpO2 98%   BMI 29.42 kg/m   Adult male, lying in bed, appears weak and tired RRR, no murmurs, no peripheral edema Respiratory rate seems normal, respirations shallow, lungs clear without rales or wheezes Abdomen soft, tenderness in the right upper quadrant, no grimace to palpation, no guarding Attentive, recognizes wife, oriented to hospital only, not to time or situation, generalized weakness, symmetric strength    Data Reviewed: K down to 2.9, Cr stable, lactic acid resolved Total bilirbuin improving, LFTs improving Platelets down to 117K  Family Communication: Wife    Disposition: Status is: Inpatient         Author: Lonni SHAUNNA Dalton, MD 01/18/2023 4:50 PM  For on call review [email or web address].

## 2023-01-19 ENCOUNTER — Inpatient Hospital Stay (HOSPITAL_COMMUNITY): Payer: Medicare HMO

## 2023-01-19 ENCOUNTER — Encounter (HOSPITAL_COMMUNITY): Payer: Self-pay

## 2023-01-19 ENCOUNTER — Inpatient Hospital Stay (HOSPITAL_COMMUNITY)
Admission: RE | Admit: 2023-01-19 | Discharge: 2023-01-19 | Disposition: A | Discharge status: Home / Self Care | Payer: Medicare HMO | Source: Ambulatory Visit | Attending: Interventional Radiology | Admitting: Interventional Radiology

## 2023-01-19 DIAGNOSIS — A419 Sepsis, unspecified organism: Secondary | ICD-10-CM | POA: Diagnosis not present

## 2023-01-19 DIAGNOSIS — T85520A Displacement of bile duct prosthesis, initial encounter: Secondary | ICD-10-CM | POA: Diagnosis not present

## 2023-01-19 HISTORY — PX: IR EXCHANGE BILIARY DRAIN: IMG6046

## 2023-01-19 HISTORY — PX: IR REMOVAL OF CALCULI/DEBRIS BILIARY DUCT/GB: IMG6054

## 2023-01-19 LAB — COMPREHENSIVE METABOLIC PANEL
ALT: 190 U/L — ABNORMAL HIGH (ref 0–44)
AST: 67 U/L — ABNORMAL HIGH (ref 15–41)
Albumin: 2.7 g/dL — ABNORMAL LOW (ref 3.5–5.0)
Alkaline Phosphatase: 229 U/L — ABNORMAL HIGH (ref 38–126)
Anion gap: 7 (ref 5–15)
BUN: 8 mg/dL (ref 6–20)
CO2: 26 mmol/L (ref 22–32)
Calcium: 8.6 mg/dL — ABNORMAL LOW (ref 8.9–10.3)
Chloride: 105 mmol/L (ref 98–111)
Creatinine, Ser: 0.62 mg/dL (ref 0.61–1.24)
GFR, Estimated: >60 mL/min (ref >60)
Glucose, Bld: 139 mg/dL — ABNORMAL HIGH (ref 70–99)
Potassium: 3.9 mmol/L (ref 3.5–5.1)
Sodium: 138 mmol/L (ref 135–145)
Total Bilirubin: 2.9 mg/dL — ABNORMAL HIGH (ref 0.0–1.2)
Total Protein: 6 g/dL — ABNORMAL LOW (ref 6.5–8.1)

## 2023-01-19 LAB — CBC
HCT: 34.5 % — ABNORMAL LOW (ref 39.0–52.0)
Hemoglobin: 11.9 g/dL — ABNORMAL LOW (ref 13.0–17.0)
MCH: 28.4 pg (ref 26.0–34.0)
MCHC: 34.5 g/dL (ref 30.0–36.0)
MCV: 82.3 fL (ref 80.0–100.0)
Platelets: 119 10*3/uL — ABNORMAL LOW (ref 150–400)
RBC: 4.19 MIL/uL — ABNORMAL LOW (ref 4.22–5.81)
RDW: 13.6 % (ref 11.5–15.5)
WBC: 5.2 10*3/uL (ref 4.0–10.5)
nRBC: 0 % (ref 0.0–0.2)

## 2023-01-19 LAB — GLUCOSE, CAPILLARY
Glucose-Capillary: 118 mg/dL — ABNORMAL HIGH (ref 70–99)
Glucose-Capillary: 127 mg/dL — ABNORMAL HIGH (ref 70–99)
Glucose-Capillary: 118 mg/dL — ABNORMAL HIGH (ref 70–99)
Glucose-Capillary: 121 mg/dL — ABNORMAL HIGH (ref 70–99)

## 2023-01-19 LAB — PROTIME-INR
INR: 1 (ref 0.8–1.2)
Prothrombin Time: 13.7 s (ref 11.4–15.2)

## 2023-01-19 MED ORDER — LIDOCAINE-EPINEPHRINE 1 %-1:100000 IJ SOLN
INTRAMUSCULAR | Status: AC
  Filled 2023-01-19: qty 1

## 2023-01-19 MED ORDER — MIDAZOLAM HCL 2 MG/2ML IJ SOLN
INTRAMUSCULAR | Status: AC | PRN
  Administered 2023-01-19: 1 mg via INTRAVENOUS
  Administered 2023-01-19: .5 mg via INTRAVENOUS
  Administered 2023-01-19: 1 mg via INTRAVENOUS

## 2023-01-19 MED ORDER — IOHEXOL 300 MG/ML  SOLN: 50.0000 mL | Freq: Once | INTRAMUSCULAR | Status: DC | PRN

## 2023-01-19 MED ORDER — CEFAZOLIN SODIUM-DEXTROSE 2-4 GM/100ML-% IV SOLN
INTRAVENOUS | Status: AC
  Filled 2023-01-19: qty 100

## 2023-01-19 MED ORDER — IOHEXOL 300 MG/ML  SOLN: 150.0000 mL | Freq: Once | INTRAMUSCULAR | Status: DC | PRN

## 2023-01-19 MED ORDER — SODIUM CHLORIDE 0.9% FLUSH
10.0000 mL | Freq: Two times a day (BID) | INTRAVENOUS | Status: DC
  Administered 2023-01-19 – 2023-01-21 (×4): 10 mL via INTRAVENOUS

## 2023-01-19 MED ORDER — SODIUM CHLORIDE 0.9 % IV SOLN
2.0000 g | INTRAVENOUS | Status: AC
  Administered 2023-01-19: 2 g via INTRAVENOUS
  Filled 2023-01-19: qty 20

## 2023-01-19 MED ORDER — MIDAZOLAM HCL 2 MG/2ML IJ SOLN
INTRAMUSCULAR | Status: AC
  Filled 2023-01-19: qty 4

## 2023-01-19 MED ORDER — FENTANYL CITRATE (PF) 100 MCG/2ML IJ SOLN
INTRAMUSCULAR | Status: AC
  Filled 2023-01-19: qty 4

## 2023-01-19 MED ORDER — FENTANYL CITRATE (PF) 100 MCG/2ML IJ SOLN
INTRAMUSCULAR | Status: AC | PRN
  Administered 2023-01-19 (×3): 50 ug via INTRAVENOUS

## 2023-01-19 NOTE — Plan of Care (Signed)
  Problem: Education: Goal: Knowledge of General Education information will improve Description: Including pain rating scale, medication(s)/side effects and non-pharmacologic comfort measures Outcome: Progressing   Problem: Health Behavior/Discharge Planning: Goal: Ability to manage health-related needs will improve Outcome: Progressing   Problem: Clinical Measurements: Goal: Ability to maintain clinical measurements within normal limits will improve Outcome: Progressing Goal: Will remain free from infection Outcome: Progressing   Problem: Activity: Goal: Risk for activity intolerance will decrease Outcome: Progressing   Problem: Nutrition: Goal: Adequate nutrition will be maintained Outcome: Progressing   Problem: Coping: Goal: Level of anxiety will decrease Outcome: Progressing   Problem: Elimination: Goal: Will not experience complications related to bowel motility Outcome: Progressing Goal: Will not experience complications related to urinary retention Outcome: Progressing   Problem: Pain Management: Goal: General experience of comfort will improve Outcome: Progressing   Problem: Safety: Goal: Ability to remain free from injury will improve Outcome: Progressing   Problem: Skin Integrity: Goal: Risk for impaired skin integrity will decrease Outcome: Progressing   Problem: Education: Goal: Ability to describe self-care measures that may prevent or decrease complications (Diabetes Survival Skills Education) will improve Outcome: Progressing Goal: Individualized Educational Video(s) Outcome: Progressing   Problem: Coping: Goal: Ability to adjust to condition or change in health will improve Outcome: Progressing   Problem: Fluid Volume: Goal: Ability to maintain a balanced intake and output will improve Outcome: Progressing   Problem: Health Behavior/Discharge Planning: Goal: Ability to identify and utilize available resources and services will  improve Outcome: Progressing Goal: Ability to manage health-related needs will improve Outcome: Progressing   Problem: Metabolic: Goal: Ability to maintain appropriate glucose levels will improve Outcome: Progressing   Problem: Nutritional: Goal: Maintenance of adequate nutrition will improve Outcome: Progressing Goal: Progress toward achieving an optimal weight will improve Outcome: Progressing   Problem: Skin Integrity: Goal: Risk for impaired skin integrity will decrease Outcome: Progressing   Problem: Tissue Perfusion: Goal: Adequacy of tissue perfusion will improve Outcome: Progressing

## 2023-01-19 NOTE — Plan of Care (Signed)
  Problem: Activity: Goal: Risk for activity intolerance will decrease Outcome: Not Progressing   Problem: Coping: Goal: Level of anxiety will decrease Outcome: Not Progressing   Problem: Pain Management: Goal: General experience of comfort will improve Outcome: Not Progressing   Problem: Skin Integrity: Goal: Risk for impaired skin integrity will decrease Outcome: Not Progressing   Problem: Clinical Measurements: Goal: Will remain free from infection Outcome: Not Progressing

## 2023-01-19 NOTE — Progress Notes (Signed)
 At 0328, still no noted drain on the biliary bag but gown and bedsheet is soaked with bile, dressing changed as well as linen, Dr. Margo Aye updated of unmeasured biliary output. IR consult ordered.

## 2023-01-19 NOTE — Progress Notes (Signed)
  IR Note  Chole tube exch yesterday in IR Still leaking last night---RN changed dressings at least twice. Messy again this am New dressing placed  Will reevaluate in IR today Consent obtained with wife at bedside

## 2023-01-19 NOTE — Progress Notes (Signed)
 Progress Note   Patient: Troy Randall FMW:[id number] DOB: Mar 08, 1960 DOA: 01/17/2023     2 DOS: the patient was seen and examined on 01/19/2023 at 1:15PM      Brief hospital course: 63 y.o. 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  In the ER, CT showed no obvious source, other than known cholecystostomy tube and foley.  Tbili 4.1 and AST/ALT >300.  Started on Rocephin /Flagyl  and admitted for IR drain exchange.     Assessment and Plan: * Sepsis (HCC) Choledocholithiasis Presented with fever, leukocytosis, and SOFA score 4 (thrombocytopenia, decreased mentation, and elevated bilirubin).  Suspected source gallbladder over urinary tract  Initially, there was plan to exchange GB tube 1/13 and for GI to perform ERCP later  After GB tube exchange, there was heavy leakage around the tube overnight 1/13 and so on 1/14 IR removed the tube, did anterograde cholangiogram and were able to break up the CBD stone and flush it out  Post procedure cholangiogram reportdly clear.   - Continue Rocephin  and flagyl , day 2 of 5 - Follow blood cultures - Trend LFTs - GI will sign off; if LFTs improve, and tolerating diet and no fever, can discharge to home with IR and Gen Surg follow up - Will need to reestablish with Gen Surg after discharge for surgical management of cholecystitis   Acute disseminated encephalomyelitis This is followed by Dr. Vear.  Exact etiology unclear.  ADEM favored over MS. Not on disease specific therapy at present.  Hyperlipidemia - Hold Crestor  given transaminitis  Hypokalemia Supplemented  Cerebrovascular disease - Hold statin given LFTs  Essential hypertension BP  controlled - Continue metoprolol  - PRN hydralazine   DM2 (diabetes mellitus, type 2) (HCC) A1c good. Glucose normal - Hold Tresiba  - Continue SS corrections  Cognitive impairment The patient is some chronic cognitive impairment (for example typically does not know the date or day of the week), but is able to function well at home        Subjective: Feeling well, hungry after his procedure.  No change in mentation, no fever, no vomiting, no abdominal pain.     Physical Exam: BP 126/86 (BP Location: Right Arm)   Pulse 63   Temp 97.6 F (36.4 C) (Oral)   Resp 14   Ht 6' (1.829 m)   Wt 98.4 kg   SpO2 96%   BMI 29.42 kg/m   Adult male, lying in bed, appears weak and tired RRR, no murmurs, no peripheral edema Respiratory normal, lungs clear without rales or wheezes Abdomen soft nontender to palpation or guarding Skin brown fluid in his cholecystostomy bag Attention normal, psychomotor slowing mild, face symmetric, speech fluent, generalized weakness noted    Data Reviewed: Discussed with interventional radiology and GI Platelets 119 and stable Hemoglobin 11.9 stable Creatinine, sodium normal LFTs improving, total bilirubin up to 2.9    Family Communication: Wife at the bedside    Disposition: Status is: Inpatient The patient was admitted with what appeared to be cholangitis in the setting of choledocholithiasis  He is now afebrile, and his choledocholithiasis has been resolved by IR today with anterograde stone removal  ERCP is no longer necessary, GI have signed off, if his LFTs improved, and he tolerates diet  well, can probably go home to complete 5 days of antibiotics in the next day or 2        Author: Lonni SHAUNNA Dalton, MD 01/19/2023 5:19 PM  For on call review [email or web address].

## 2023-01-19 NOTE — Progress Notes (Signed)
 Referring Physician(s): Dr JAYSON Dalton  Supervising Physician: Hughes Simmonds  Patient Status:  Troy Randall - In-pt  Chief Complaint:  Leaking at Biliary drain site  Subjective:  HTN; DM; CVA; Hx GI Bleed- embolization Known to IR Last chole drain exch just yesterday Leaking overnight Discussed with Dr Hughes Needs Biliary stone removal and drain evaluation in IR today  Allergies: Bactrim  [sulfamethoxazole -trimethoprim ] and Amoxicillin   Medications: Prior to Admission medications   Medication Sig Start Date End Date Taking? Authorizing Provider  Ascorbic Acid (VITAMIN C) 500 MG CHEW Chew 1 tablet by mouth daily.   Yes [provider]  diphenhydrAMINE  (BENADRYL  ALLERGY) 25 MG tablet Take 25 mg by mouth every 6 (six) hours as needed for allergies.   Yes [provider]  metoprolol  succinate (TOPROL -XL) 25 MG 24 hr tablet Take 0.5 tablets (12.5 mg total) by mouth daily. 04/03/22  Yes Samtani, Jai-Gurmukh, MD  pantoprazole  (PROTONIX ) 40 MG tablet Take 1 tablet (40 mg total) by mouth daily. Patient taking differently: Take 40 mg by mouth daily before breakfast. 11/27/21  Yes Setzer, Sandra J, PA-C  Probiotic Product (PROBIOTIC PO) Take 1 tablet by mouth daily.   Yes [provider]  rosuvastatin  (CRESTOR ) 40 MG tablet Take 1 tablet (40 mg total) by mouth daily. 04/29/21  Yes Lue Elsie JAYSON, MD  traZODone  (DESYREL ) 50 MG tablet Take 0.5-1 tablets (25-50 mg total) by mouth at bedtime as needed for sleep. Patient taking differently: Take 50 mg by mouth at bedtime. 11/26/21  Yes Setzer, Sandra J, PA-C  TRESIBA  FLEXTOUCH 200 UNIT/ML FlexTouch Pen Inject under skin 15 units twice a day. Patient taking differently: Inject 18 Units into the skin See admin instructions. Inject 18 units into the skin daily- BGL must be 130 or greater 11/26/21  Yes Setzer, Sandra J, PA-C  TYLENOL  500 MG tablet Take 1 tablet (500 mg total) by mouth every 6 (six) hours as needed for mild  pain or headache. 04/03/22  Yes Samtani, Jai-Gurmukh, MD  BD VEO INSULIN  SYRINGE U/F 31G X 15/64 1 ML MISC  USE AS DIRECTED 06/08/17   Rolinda Millman, MD  glucose blood (ONETOUCH VERIO) test strip TEST twice a day 05/04/17   Rolinda Millman, MD  Insulin  Pen Needle (NOVOTWIST) 32G X 5 MM MISC Use two daily to inject Victoza  and Toujeo . 04/25/15   [place]on [place], MD  methocarbamol  (ROBAXIN ) 500 MG tablet Take 1 tablet (500 mg total) by mouth every 6 (six) hours as needed for muscle spasms. Patient not taking: Reported on 01/17/2023 11/26/21   Setzer, Sandra J, PA-C  nitrofurantoin, macrocrystal-monohydrate, (MACROBID) 100 MG capsule Take 100 mg by mouth 2 (two) times daily. Patient not taking: Reported on 01/17/2023    [provider]  AISHA PASTOR LANCETS 33G MISC Use to check blood sugar once a day dx code E11.65 11/21/14   Von [place], MD  potassium chloride  (KLOR-CON ) 10 MEQ tablet Take 10 mEq by mouth daily. Patient not taking: Reported on 01/17/2023    [provider]     Vital Signs: BP (!) 179/82 (BP Location: Right Arm)   Pulse (!) 58   Temp 97.6 F (36.4 C) (Oral)   Resp 19   Ht 6' (1.829 m)   Wt 216 lb 14.9 oz (98.4 kg)   SpO2 97%   BMI 29.42 kg/m   Physical Exam Vitals reviewed.  Cardiovascular:     Rate and Rhythm: Normal rate and regular rhythm.     Heart sounds: Normal heart  sounds.  Pulmonary:     Breath sounds: Normal breath sounds.  Abdominal:     Palpations: Abdomen is soft.  Skin:    General: Skin is warm.  Neurological:     Mental Status: He is alert and oriented to person, place, and time. Mental status is at baseline.  Psychiatric:     Comments: Wife at bedside--- consented for procedure     Imaging: US  Abdomen Limited RUQ (LIVER/GB) Result Date: 01/17/2023 CLINICAL DATA:  151471 RUQ pain 151471 history of cholecystitis with cholecystectomy injuring. Concern for cholangitis. EXAM: ULTRASOUND ABDOMEN LIMITED RIGHT UPPER QUADRANT COMPARISON:   None Available. FINDINGS: Nondiagnostic images obtained due to placement of drain, bandaging, patient pain as per ultrasound tech. IMPRESSION: Nondiagnostic images obtained due to placement of drain, bandaging, patient pain as per ultrasound tech. Electronically Signed   By: Morgane  Naveau M.D.   On: 01/17/2023 22:52   CT ABDOMEN PELVIS W CONTRAST Result Date: 01/17/2023 CLINICAL DATA:  Abdominal pain, acute, nonlocalized fever, AMS, h/o kidney stones, s/p percutaneous choly drain c/f obstruction EXAM: CT ABDOMEN AND PELVIS WITH CONTRAST TECHNIQUE: Multidetector CT imaging of the abdomen and pelvis was performed using the standard protocol following bolus administration of intravenous contrast. RADIATION DOSE REDUCTION: This exam was performed according to the departmental dose-optimization program which includes automated exposure control, adjustment of the mA and/or kV according to patient size and/or use of iterative reconstruction technique. CONTRAST:  75mL OMNIPAQUE  IOHEXOL  350 MG/ML SOLN COMPARISON:  Abdominal CT 04/01/2022 FINDINGS: Lower chest: Breathing motion artifact. No confluent airspace disease or pleural effusion allowing for motion. Hepatobiliary: Motion in the upper abdomen limits assessment. Cholecystostomy tube appears to be coiled in the gallbladder fundus. The gallbladder is decompressed. Small gallstones which are not well assessed due to motion. There is mild central intrahepatic biliary ductal dilatation. The common bile duct is poorly assessed on this motion degraded exam, measures approximately 9 mm, series 3, image 34. There is no obvious choledocholithiasis. Pancreas: Fatty atrophy. No evidence of inflammation allowing for motion. Spleen: Normal in size without focal abnormality. Adrenals/Urinary Tract: No adrenal nodule. No hydronephrosis. Nonobstructing stones in the mid right kidney. No evidence of ureteral stone. Foley catheter decompresses the urinary bladder. There are layering  stones in the bladder. Mild diffuse bladder wall thickening. Right renal cyst. No further follow-up imaging is recommended. Stomach/Bowel: The stomach is nondistended. Duodenal diverticula as before. No bowel obstruction. There is no obvious bowel inflammation however motion limits assessment multiple bowel loops. Normal appendix is visualized. Small volume of stool in the colon. Vascular/Lymphatic: Normal caliber abdominal aorta, mild atherosclerosis. Retroaortic left renal vein. Coils in the central small bowel mesentery. Portal vein appears to be patent. No suspicious lymphadenopathy. Reproductive: Unremarkable prostate. Other: No ascites or free air.  Prior umbilical hernia repair. Musculoskeletal: No acute osseous finding allowing for motion artifact limitations IMPRESSION: 1. Cholecystostomy tube appears to be coiled in the gallbladder fundus. The gallbladder is decompressed. Small gallstones which are not well assessed due to motion. 2. Mild central intrahepatic biliary ductal dilatation. The common bile duct is poorly assessed on this motion degraded exam, measures approximately 9 mm. No obvious choledocholithiasis. Recommend correlation with LFTs. Ultrasound could be considered as clinically indicated. MRCP is not recommended for this patient given motion on CT 3. Nonobstructing right renal stones. Layering stones in the bladder. Foley catheter decompresses the urinary bladder. Mild diffuse bladder wall thickening, recommend correlation with urinalysis. Aortic Atherosclerosis (ICD10-I70.0). Electronically Signed   By: Andrea Marlee HERO.D.  On: 01/17/2023 19:14    Labs:  CBC: Recent Labs    03/31/22 0836 01/17/23 1609 01/18/23 0405 01/19/23 0402  WBC 6.0 11.5* 8.5 5.2  HGB 11.3* 13.1 11.2* 11.9*  HCT 36.1* 38.7* 33.0* 34.5*  PLT 175 141* 117* 119*    COAGS: Recent Labs    03/29/22 1618  INR 1.1  APTT 24    BMP: Recent Labs    04/03/22 0339 01/17/23 1609 01/18/23 0405  01/19/23 0402  NA 134* 138 136 138  K 3.1* 3.0* 2.9* 3.9  CL 106 99 102 105  CO2 20* 26 25 26   GLUCOSE 116* 145* 127* 139*  BUN 11 14 10 8   CALCIUM  8.1* 9.0 8.1* 8.6*  CREATININE 0.67 1.11 0.78 0.62  GFRNONAA >60 >60 >60 >60    LIVER FUNCTION TESTS: Recent Labs    03/30/22 0321 04/02/22 0409 04/03/22 0339 01/17/23 1609 01/18/23 0405 01/19/23 0402  BILITOT 1.0  --   --  4.1* 1.8* 2.9*  AST 35  --   --  386* 177* 67*  ALT 33  --   --  473* 288* 190*  ALKPHOS 58  --   --  288* 230* 229*  PROT 6.1*  --   --  6.9 5.8* 6.0*  ALBUMIN  2.8*   < > 2.9* 3.3* 2.7* 2.7*   < > = values in this interval not displayed.    Assessment and Plan:  Scheduled for Biliary stone removal and drain evaluation Pt and wife are aware of procedure benefits and risks including but limited to' infection; bleeding; organ damage and damage to surrounding structures Agreeable to proceed Consent signed and in chart  Electronically Signed: Sharlet DELENA Candle, PA-C 01/19/2023, 8:24 AM   I spent a total of 15 Minutes at the the patient's bedside AND on the patient's hospital floor or unit, greater than 50% of which was counseling/coordinating care for Biliary stone removal; drain evaluation

## 2023-01-20 DIAGNOSIS — A419 Sepsis, unspecified organism: Secondary | ICD-10-CM | POA: Diagnosis not present

## 2023-01-20 LAB — GLUCOSE, CAPILLARY
Glucose-Capillary: 134 mg/dL — ABNORMAL HIGH (ref 70–99)
Glucose-Capillary: 150 mg/dL — ABNORMAL HIGH (ref 70–99)
Glucose-Capillary: 169 mg/dL — ABNORMAL HIGH (ref 70–99)
Glucose-Capillary: 157 mg/dL — ABNORMAL HIGH (ref 70–99)

## 2023-01-20 LAB — COMPREHENSIVE METABOLIC PANEL
ALT: 120 U/L — ABNORMAL HIGH (ref 0–44)
AST: 30 U/L (ref 15–41)
Albumin: 2.6 g/dL — ABNORMAL LOW (ref 3.5–5.0)
Alkaline Phosphatase: 196 U/L — ABNORMAL HIGH (ref 38–126)
Anion gap: 8 (ref 5–15)
BUN: 9 mg/dL (ref 6–20)
CO2: 23 mmol/L (ref 22–32)
Calcium: 8.6 mg/dL — ABNORMAL LOW (ref 8.9–10.3)
Chloride: 106 mmol/L (ref 98–111)
Creatinine, Ser: 0.62 mg/dL (ref 0.61–1.24)
GFR, Estimated: >60 mL/min (ref >60)
Glucose, Bld: 125 mg/dL — ABNORMAL HIGH (ref 70–99)
Potassium: 3.5 mmol/L (ref 3.5–5.1)
Sodium: 137 mmol/L (ref 135–145)
Total Bilirubin: 1.4 mg/dL — ABNORMAL HIGH (ref 0.0–1.2)
Total Protein: 5.7 g/dL — ABNORMAL LOW (ref 6.5–8.1)

## 2023-01-20 LAB — CBC
HCT: 34.5 % — ABNORMAL LOW (ref 39.0–52.0)
Hemoglobin: 11.8 g/dL — ABNORMAL LOW (ref 13.0–17.0)
MCH: 28.4 pg (ref 26.0–34.0)
MCHC: 34.2 g/dL (ref 30.0–36.0)
MCV: 83.1 fL (ref 80.0–100.0)
Platelets: 120 10*3/uL — ABNORMAL LOW (ref 150–400)
RBC: 4.15 MIL/uL — ABNORMAL LOW (ref 4.22–5.81)
RDW: 13.9 % (ref 11.5–15.5)
WBC: 6.5 10*3/uL (ref 4.0–10.5)
nRBC: 0 % (ref 0.0–0.2)

## 2023-01-20 LAB — URINE CULTURE

## 2023-01-20 NOTE — Evaluation (Signed)
 Physical Therapy Evaluation Patient Details Name: Troy Randall MRN: [id number] DOB: [birth year] Today's Date: 01/20/2023  History of Present Illness  63 y.o. 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, c/b renal failure requiring CRRT and also new demyelinating disease (Acute disseminated encephalomyelitis vs MS) and cholecystitis with cholecystostomy tube placement (09/07/21) still in place as well as nephrolithiasis and indwelling foley  Clinical Impression    Admitted with above diagnosis; comes from home, where he lives with his wife in a single level home; prior to admission, patient was working with HHPT, and he was able to walk household distances with a rolling walker and close guard from his wife and a wheelchair follow; has been dependent on his wife's assistance for ADLs, bathing, in particular; Rresents to PT with generalized weakness, and a functional decline compared to his baseline of walking household distances with the rolling walker; today, Pharis needed heavy, moderate assist to move to sit at the edge of he bed (head of bed elevated), and min assist to stand from the edge of the elevated bed to the rolling walker, March in place, and take side steps towards the head of the bed; will benefit from acute physical therapy services to maximize independence and safety with mobility and allow for safe transition out of the hospital; Worth considering a non-emergent ambulance ride home at discharge.       If plan is discharge home, recommend the following: A lot of help with walking and/or transfers   Can  travel by private vehicle        Equipment Recommendations None recommended by PT (very well-equipped)  Recommendations for Other Services       Functional Status Assessment Patient has had a recent decline in their functional status and demonstrates the ability to make significant improvements in function in a reasonable and predictable amount of time.     Precautions / Restrictions Precautions Precautions: Fall      Mobility  Bed Mobility Overal bed mobility: Needs Assistance Bed Mobility: Supine to Sit     Supine to sit: HOB elevated, Mod assist     General bed mobility comments: heavy mod assist to elevate trunk to sit    Transfers Overall transfer level: Needs assistance   Transfers: Sit to/from Stand, Bed to chair/wheelchair/BSC Sit to Stand: Contact guard assist, Min assist   Step pivot transfers: Mod assist       General transfer comment: Stood from elevated bed with min assist to steady RW; slow rise, but did not need physical assist to power up; sidesteps and marching in place at EOB to simulate step pivot transfers; light mod assist for balance    Ambulation/Gait                  Stairs            Wheelchair Mobility     Tilt Bed    Modified Rankin (Stroke Patients Only)       Balance Overall balance assessment: Needs assistance   Sitting balance-Leahy Scale: Fair       Standing  balance-Leahy Scale: Poor                               Pertinent Vitals/Pain Pain Assessment Pain Assessment: Faces Faces Pain Scale: Hurts a little bit Pain Location: Generalized; also grimace with effort Pain Descriptors / Indicators: Grimacing Pain Intervention(s): Monitored during session    Home Living Family/patient expects to be discharged to:: Private residence Living Arrangements: Spouse/significant other Available Help at Discharge: Family;Available 24 hours/day;Other (Comment) Type of Home: House Home Access: Stairs  to enter Entrance Stairs-Rails: None Entrance Stairs-Number of Steps: 2   Home Layout: Able to live on main level with bedroom/bathroom;Laundry or work area in basement;One level Home Equipment: Wheelchair - Forensic psychologist (2 wheels);BSC/3in1      Prior Function Prior Level of Function : Needs assist             Mobility Comments: assist with ambulation ADLs Comments: assisted by family     Extremity/Trunk Assessment   Upper Extremity Assessment Upper Extremity Assessment: Defer to OT evaluation    Lower Extremity Assessment Lower Extremity Assessment: Generalized weakness       Communication   Communication Communication: Difficulty communicating thoughts/reduced clarity of speech Cueing Techniques: Verbal cues;Tactile cues;Visual cues  Cognition Arousal: Alert Behavior During Therapy: WFL for tasks assessed/performed Overall Cognitive Status: History of cognitive impairments - at baseline                                          General Comments General comments (skin integrity, edema, etc.): wife present and very supportive    Exercises     Assessment/Plan    PT Assessment Patient needs continued PT services  PT Problem List Decreased strength;Decreased range of motion;Decreased activity tolerance;Decreased balance;Decreased mobility;Decreased coordination;Decreased cognition;Decreased knowledge of use of DME;Decreased safety awareness;Decreased knowledge of precautions;Impaired tone       PT Treatment Interventions DME instruction;Gait training;Stair training;Functional mobility training;Therapeutic activities;Therapeutic exercise;Balance training;Neuromuscular re-education;Cognitive remediation;Patient/family education;Wheelchair mobility training    PT Goals (Current goals can be found in the Care Plan section)  Acute Rehab PT Goals Patient Stated Goal: wants to get home PT Goal Formulation: With patient/family Time For Goal  Achievement: 02/03/23 Potential to Achieve Goals: Good    Frequency Min 1X/week     Co-evaluation               AM-PAC PT "6 Clicks" Mobility  Outcome Measure Help needed turning from your back to your side while in a flat bed without using bedrails?: A Lot Help needed moving from lying on your back to sitting on the side of a flat bed without using bedrails?: A Lot Help needed moving to and from a bed to a chair (including a wheelchair)?: A Lot Help needed standing up from a chair using your arms (e.g., wheelchair or bedside chair)?: A Lot Help needed to walk in hospital room?: A Lot Help needed climbing 3-5 steps with a railing? : Total 6 Click Score: 11    End of Session Equipment Utilized During Treatment: Gait belt Activity Tolerance: Patient tolerated treatment well Patient left: in bed;with call bell/phone within reach;with family/visitor present Nurse Communication: Mobility status PT Visit Diagnosis: Unsteadiness on feet (R26.81);Other abnormalities of gait and mobility (R26.89);Muscle weakness (generalized) (M62.81);Difficulty in walking, not elsewhere classified (R26.2)    Time: 1610-9604 PT Time Calculation (min) (  ACUTE ONLY): 29 min   Charges:   PT Evaluation $PT Eval Moderate Complexity: 1 Mod PT Treatments $Therapeutic Activity: 8-22 mins PT General Charges $$ ACUTE PT VISIT: 1 Visit         Darcus Eastern, PT  Acute Rehabilitation Services Office [phone] Secure Chat welcomed   Marcial Setting 01/20/2023, 4:39 PM

## 2023-01-20 NOTE — Progress Notes (Signed)
 PROGRESS NOTE    Troy Randall  ZOX:[id number] DOB: January 10, 1960 DOA: 01/17/2023 PCP: Omie Bickers, MD   Brief Narrative:  This 63 yrs old Male with PMH significant for HTN, HLD, DM, CVA, hx. 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 In the ER, CT showed no obvious source, other than known cholecystostomy tube and foley.  Tbili 4.1 and AST/ALT >300.  Started on Rocephin /Flagyl  and admitted for IR drain exchange.  Assessment & Plan:   Principal Problem:   Sepsis (HCC) Active Problems:   Biliary drain displacement   Acute disseminated encephalomyelitis   Hyperlipidemia   DM2 (diabetes mellitus, type 2) (HCC)   Essential hypertension   Cerebrovascular disease   Hypokalemia  Secondary to choledocholithiasis: Patient presented with fever, leukocytosis, and SOFA score 4 (thrombocytopenia, decreased mentation, and elevated bilirubin).   Suspected source gallbladder over urinary tract Initially, there was plan to exchange GB tube on 1/13 and for GI to perform ERCP later. After GB tube exchange, there was heavy leakage around the tube overnight on 1/13 and so on 1/14 IR removed the tube, did anterograde cholangiogram and were able to break up the CBD stone and flush it out.   Post procedure cholangiogram reportedly clear.   Continue Rocephin  and flagyl , day 3 of 5 Blood cultures no growth so far, urine culture multiple species. Patient has significant drain output. Trend LFTs GI will sign off; if LFTs improve, and tolerating diet and no fever, can be discharged to home with IR and Gen Surg follow up. Needs to reestablish with Gen Surg after discharge for surgical management of cholecystitis.   Acute disseminated  encephalomyelitis: This is followed by Dr. Godwin Lat.  Exact etiology unclear.   ADEM favored over MS. Not on disease specific therapy at present.   Hyperlipidemia: Hold Crestor  given transaminitis   Hypokalemia: Replaced. Continue to monitor   Cerebrovascular disease: Hold statin given LFTs.   Essential hypertension: BP controlled. Continue metoprolol . Continue PRN hydralazine .   DM2 (diabetes mellitus, type 2) (HCC) HbA1c good. Glucose normal Hold Tresiba  Continue SS corrections   Cognitive impairment: Patient has chronic cognitive impairment (for example typically does not know the date or day of the week), but is able to function well at home.    DVT prophylaxis: SCDs Code Status:Full code Family Communication: Wife at bed side Disposition Plan:    Status is: Inpatient Remains inpatient appropriate because: Admitted for sepsis secondary to choledocholithiasis.   Consultants:  Gastroenterology IR  Procedures:  Antimicrobials:  Anti-infectives (From admission, onward)    Start     Dose/Rate Route Frequency Ordered Stop   01/19/23 0930  cefTRIAXone  (ROCEPHIN ) 2 g in sodium chloride  0.9 % 100 mL IVPB        2 g 200 mL/hr over 30 Minutes Intravenous To Radiology 01/19/23 0836 01/20/23 0213   01/18/23 0400  metroNIDAZOLE  (FLAGYL ) IVPB 500 mg        500 mg 100 mL/hr over 60 Minutes Intravenous Every 12 hours 01/17/23 2047     01/18/23 0000  cefTRIAXone  (ROCEPHIN ) 2 g in sodium chloride  0.9 % 100 mL IVPB        2 g 200 mL/hr over 30 Minutes Intravenous Every 24 hours 01/17/23 2047     01/17/23 1615  ceFEPIme  (MAXIPIME )  2 g in sodium chloride  0.9 % 100 mL IVPB       Placed in "And" Linked Group   2 g 200 mL/hr over 30 Minutes Intravenous  Once 01/17/23 1601 01/17/23 1652   01/17/23 1615  metroNIDAZOLE  (FLAGYL ) IVPB 500 mg       Placed in "And" Linked Group   500 mg 100 mL/hr over 60 Minutes Intravenous  Once 01/17/23 1601 01/17/23 1758      Subjective: Patient  was seen and examined at bedside.  Overnight events noted.  Patient report doing much better. Labs are improving.  Patient still feels slightly nauseous but improving.  Objective: Vitals:   01/19/23 1844 01/19/23 2129 01/20/23 0856 01/20/23 0901  BP: (!) 173/71 (!) 159/82 (!) 163/78 137/71  Pulse: 63 62 65 81  Resp: 19   18  Temp: 98.4 F (36.9 C) 98.4 F (36.9 C) 97.7 F (36.5 C) 97.6 F (36.4 C)  TempSrc: Oral Oral Oral Oral  SpO2: 99% 96% 97% 99%  Weight:      Height:        Intake/Output Summary (Last 24 hours) at 01/20/2023 1447 Last data filed at 01/20/2023 0934 Gross per 24 hour  Intake 680 ml  Output 1140 ml  Net -460 ml   Filed Weights   01/17/23 1523  Weight: 98.4 kg    Examination:  General exam: Appears calm and comfortable, deconditioned, not in any acute distress. Respiratory system: Clear to auscultation. Respiratory effort normal.  RR 15 Cardiovascular system: S1 & S2 heard, RRR. No JVD, murmurs, rubs, gallops or clicks. No pedal edema. Gastrointestinal system: Abdomen is nondistended, soft and nontender. Normal bowel sounds heard. Central nervous system: Alert and oriented x 3. No focal neurological deficits. Extremities: No edema, no cyanosis, no clubbing. Skin: No rashes, lesions or ulcers Psychiatry: Judgement and insight appear normal. Mood & affect appropriate.     Data Reviewed: I have personally reviewed following labs and imaging studies  CBC: Recent Labs  Lab 01/17/23 1609 01/18/23 0405 01/19/23 0402 01/20/23 0413  WBC 11.5* 8.5 5.2 6.5  NEUTROABS 10.3*  --   --   --   HGB 13.1 11.2* 11.9* 11.8*  HCT 38.7* 33.0* 34.5* 34.5*  MCV 84.5 84.4 82.3 83.1  PLT 141* 117* 119* 120*   Basic Metabolic Panel: Recent Labs  Lab 01/17/23 1609 01/18/23 0405 01/19/23 0402 01/20/23 0413  NA 138 136 138 137  K 3.0* 2.9* 3.9 3.5  CL 99 102 105 106  CO2 26 25 26 23   GLUCOSE 145* 127* 139* 125*  BUN 14 10 8 9   CREATININE 1.11 0.78 0.62 0.62   CALCIUM  9.0 8.1* 8.6* 8.6*   GFR: Estimated Creatinine Clearance: 119.3 mL/min (by C-G formula based on SCr of 0.62 mg/dL). Liver Function Tests: Recent Labs  Lab 01/17/23 1609 01/18/23 0405 01/19/23 0402 01/20/23 0413  AST 386* 177* 67* 30  ALT 473* 288* 190* 120*  ALKPHOS 288* 230* 229* 196*  BILITOT 4.1* 1.8* 2.9* 1.4*  PROT 6.9 5.8* 6.0* 5.7*  ALBUMIN  3.3* 2.7* 2.7* 2.6*   Recent Labs  Lab 01/17/23 1609  LIPASE 49   Recent Labs  Lab 01/17/23 1609  AMMONIA 12   Coagulation Profile: Recent Labs  Lab 01/19/23 0932  INR 1.0   Cardiac Enzymes: No results for input(s): "CKTOTAL", "CKMB", "CKMBINDEX", "TROPONINI" in the last 168 hours. BNP (last 3 results) No results for input(s): "PROBNP" in the last 8760 hours. HbA1C: No results for input(s): "HGBA1C" in  the last 72 hours. CBG: Recent Labs  Lab 01/19/23 1252 01/19/23 1655 01/19/23 2130 01/20/23 0858 01/20/23 1216  GLUCAP 127* 118* 121* 134* 150*   Lipid Profile: No results for input(s): "CHOL", "HDL", "LDLCALC", "TRIG", "CHOLHDL", "LDLDIRECT" in the last 72 hours. Thyroid  Function Tests: No results for input(s): "TSH", "T4TOTAL", "FREET4", "T3FREE", "THYROIDAB" in the last 72 hours. Anemia Panel: No results for input(s): "VITAMINB12", "FOLATE", "FERRITIN", "TIBC", "IRON", "RETICCTPCT" in the last 72 hours. Sepsis Labs: Recent Labs  Lab 01/17/23 1609 01/17/23 1739 01/17/23 2020  LATICACIDVEN 3.1* 3.0* 1.4    Recent Results (from the past 240 hours)  Culture, blood (routine x 2)     Status: None (Preliminary result)   Collection Time: 01/17/23  4:09 PM   Specimen: BLOOD  Result Value Ref Range Status   Specimen Description BLOOD BLOOD LEFT ARM  Final   Special Requests   Final    BOTTLES DRAWN AEROBIC AND ANAEROBIC Blood Culture adequate volume   Culture   Final    NO GROWTH 3 DAYS Performed at [facility], [address] [place], [place] [address]    Report Status PENDING   Incomplete  Culture, blood (routine x 2)     Status: None (Preliminary result)   Collection Time: 01/17/23  4:10 PM   Specimen: BLOOD  Result Value Ref Range Status   Specimen Description BLOOD RIGHT ANTECUBITAL  Final   Special Requests   Final    AEROBIC BOTTLE ONLY Blood Culture results may not be optimal due to an inadequate volume of blood received in culture bottles   Culture   Final    NO GROWTH 3 DAYS Performed at [facility], [address] [place], [place] [address]    Report Status PENDING  Incomplete  Culture, Urine (Do not remove urinary catheter, catheter placed by urology or difficult to place)     Status: Abnormal   Collection Time: 01/17/23  9:02 PM   Specimen: Urine, Catheterized  Result Value Ref Range Status   Specimen Description URINE, CATHETERIZED  Final   Special Requests   Final    NONE Performed at [facility], [address], [place], [place] [address]    Culture MULTIPLE SPECIES PRESENT, SUGGEST RECOLLECTION (A)  Final   Report Status 01/20/2023 FINAL  Final    Radiology Studies: No results found.  Scheduled Meds:  Chlorhexidine  Gluconate Cloth  6 each Topical Daily   insulin  aspart  0-15 Units Subcutaneous TID WC   insulin  aspart  0-5 Units Subcutaneous QHS   metoprolol  succinate  12.5 mg Oral Daily   pantoprazole   40 mg Oral QAC breakfast   silver  nitrate applicators  2 Stick Topical Once   sodium chloride  flush  10 mL Intravenous Q12H   traZODone   50 mg Oral QHS   Continuous Infusions:  cefTRIAXone  (ROCEPHIN )  IV 2 g (01/20/23 0212)   metronidazole  500 mg (01/20/23 0414)     LOS: 3 days    Time spent: 50 mins    Magdalene School, MD [facility]   If 7PM-7AM, please contact night-coverage

## 2023-01-20 NOTE — TOC CM/SW Note (Signed)
 Transition of Care [facility]) - Inpatient Brief Assessment   Patient Details  Name: Troy Randall MRN: [id number] Date of Birth: Dec 05, 1960  [facility] CM/SW Contact:    Tom-Johnson, Angelique Ken, RN Phone Number: 01/20/2023, 4:17 PM   Clinical Narrative:  Patient presented ton the ED with Fever and decreased Biliary drain output. Patient admitted with Sepsis 2/2 Choledocholithiasis. Has hx of Nephrolithiasis, new Demyelinating Disease dx, Cholecystitis with Cholecystostomy tube placement 09/07/21 which is still in place and also has chronic Foley cath in place. and indwelling foley who presented with fever and decreased biliary drain output.  Plan for IR drain exchange, on IV abx.  From home with wife, has two supportive children, not employed, on disability. Has all necessary DME's including hospital bed at home. Has two supportive siblings. Wife transports to and from appointment, has most of his Dr's appointments through Telehealth. PCP is Omie Bickers, MD and uses [facility] in [place].   Patient is currently active with [facility] with home health disciplines, resumption of care referral sent to [facility] with acceptance noted, info on AVS.   Patient not Medically ready for discharge.  CM will continue to follow as patient progresses with care towards discharge.             Transition of Care Asessment: Insurance and Status: Insurance coverage has been reviewed Patient has primary care physician: Yes Home environment has been reviewed: Yes Prior level of function:: Modified independent Prior/Current Home Services: Current home services Social Drivers of Health Review: SDOH reviewed no interventions necessary Readmission risk has been reviewed: Yes Transition of care needs: transition of care needs identified, TOC will continue to follow

## 2023-01-20 NOTE — Progress Notes (Signed)
 Physical Therapy Note  PT eval complete with full note to follow;  Recommend getting back home with HHPT services for transition out of hospital;  Will put in OT order per protocol;  Worth considering non-emergent ambulance transport home, depending on progress;  Will follow;   Thank you,   Darcus Eastern, PT  Acute Rehabilitation Services Pager [phone] Office [phone]

## 2023-01-20 NOTE — Care Management Important Message (Signed)
 Important Message  Patient Details  Name: Troy Randall MRN: [id number] Date of Birth: September 14, 1960   Important Message Given:  Yes - Medicare IM     Wynonia Hedges 01/20/2023, 4:00 PM

## 2023-01-21 DIAGNOSIS — K8050 Calculus of bile duct without cholangitis or cholecystitis without obstruction: Secondary | ICD-10-CM | POA: Diagnosis not present

## 2023-01-21 DIAGNOSIS — A419 Sepsis, unspecified organism: Secondary | ICD-10-CM | POA: Diagnosis not present

## 2023-01-21 LAB — COMPREHENSIVE METABOLIC PANEL
ALT: 88 U/L — ABNORMAL HIGH (ref 0–44)
AST: 18 U/L (ref 15–41)
Albumin: 2.6 g/dL — ABNORMAL LOW (ref 3.5–5.0)
Alkaline Phosphatase: 160 U/L — ABNORMAL HIGH (ref 38–126)
Anion gap: 7 (ref 5–15)
BUN: 8 mg/dL (ref 6–20)
CO2: 23 mmol/L (ref 22–32)
Calcium: 8.5 mg/dL — ABNORMAL LOW (ref 8.9–10.3)
Chloride: 106 mmol/L (ref 98–111)
Creatinine, Ser: 0.55 mg/dL — ABNORMAL LOW (ref 0.61–1.24)
GFR, Estimated: >60 mL/min (ref >60)
Glucose, Bld: 148 mg/dL — ABNORMAL HIGH (ref 70–99)
Potassium: 2.8 mmol/L — ABNORMAL LOW (ref 3.5–5.1)
Sodium: 136 mmol/L (ref 135–145)
Total Bilirubin: 0.7 mg/dL (ref 0.0–1.2)
Total Protein: 5.8 g/dL — ABNORMAL LOW (ref 6.5–8.1)

## 2023-01-21 LAB — GLUCOSE, CAPILLARY
Glucose-Capillary: 146 mg/dL — ABNORMAL HIGH (ref 70–99)
Glucose-Capillary: 192 mg/dL — ABNORMAL HIGH (ref 70–99)

## 2023-01-21 LAB — CBC
HCT: 34.4 % — ABNORMAL LOW (ref 39.0–52.0)
Hemoglobin: 11.7 g/dL — ABNORMAL LOW (ref 13.0–17.0)
MCH: 28.3 pg (ref 26.0–34.0)
MCHC: 34 g/dL (ref 30.0–36.0)
MCV: 83.1 fL (ref 80.0–100.0)
Platelets: 142 10*3/uL — ABNORMAL LOW (ref 150–400)
RBC: 4.14 MIL/uL — ABNORMAL LOW (ref 4.22–5.81)
RDW: 13.7 % (ref 11.5–15.5)
WBC: 5.5 10*3/uL (ref 4.0–10.5)
nRBC: 0 % (ref 0.0–0.2)

## 2023-01-21 LAB — MRSA NEXT GEN BY PCR, NASAL: MRSA by PCR Next Gen: DETECTED — AB

## 2023-01-21 LAB — MAGNESIUM: Magnesium: 2.1 mg/dL (ref 1.7–2.4)

## 2023-01-21 LAB — PHOSPHORUS: Phosphorus: 3.7 mg/dL (ref 2.5–4.6)

## 2023-01-21 MED ORDER — POTASSIUM CHLORIDE 10 MEQ/100ML IV SOLN
10.0000 meq | INTRAVENOUS | Status: AC
  Administered 2023-01-21 (×3): 10 meq via INTRAVENOUS
  Filled 2023-01-21 (×3): qty 100

## 2023-01-21 MED ORDER — CHLORHEXIDINE GLUCONATE CLOTH 2 % EX PADS: 6.0000 | MEDICATED_PAD | Freq: Every day | CUTANEOUS | Status: DC

## 2023-01-21 MED ORDER — METRONIDAZOLE 500 MG PO TABS: 500.0000 mg | ORAL_TABLET | Freq: Three times a day (TID) | ORAL | 0 refills | Status: AC

## 2023-01-21 MED ORDER — POTASSIUM CHLORIDE 20 MEQ PO PACK
40.0000 meq | PACK | Freq: Once | ORAL | Status: AC
  Administered 2023-01-21: 40 meq via ORAL
  Filled 2023-01-21: qty 2

## 2023-01-21 MED ORDER — CEFADROXIL 500 MG PO CAPS: 500.0000 mg | ORAL_CAPSULE | Freq: Two times a day (BID) | ORAL | 0 refills | Status: AC

## 2023-01-21 MED ORDER — MUPIROCIN 2 % EX OINT: 1.0000 | TOPICAL_OINTMENT | Freq: Two times a day (BID) | CUTANEOUS | Status: DC

## 2023-01-21 NOTE — Progress Notes (Signed)
OT Cancellation Note  Patient Details Name: Troy Randall MRN: [id number] DOB: Nov 02, 1960   Cancelled Treatment:    Reason Eval/Treat Not Completed: OT screened, no needs identified, will sign off (Spoke with pt and spouse before DC, pt reports he his currently comfortable with current level of dependence. Spouse went into detail on home system and reports having needed DME, no skilled OT needs at home identified. OT signing off.)  01/21/2023  AB, OTR/L  Acute Rehabilitation Services  Office: [phone]   Tristan Schroeder 01/21/2023, 12:24 PM

## 2023-01-21 NOTE — Progress Notes (Signed)
Physical Therapy Treatment Patient Details Name: Troy Randall MRN: [id number] DOB: Nov 18, 1960 Today's Date: 01/21/2023   History of Present Illness 63 y.o. 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 Pt requires increased cues and encouragement to complete tasks with less assist and for problem solving due to pt demonstrating increased agitation intermittently. Pt able to sit to EOB with mod A, stand with min A, and tolerate short gait trial to hallway before requiring a seated rest due to fatigue. Pt's wife reports having all necessary equipment and feels comfortable managing pt's mobility needs at home when medically ready for discharge. Pt continues to benefit from PT services to progress toward functional mobility goals.    If plan is discharge home, recommend the following: A lot of help with walking and/or transfers   Can travel by private vehicle        Equipment Recommendations  None recommended by PT    Recommendations for Other Services       Precautions / Restrictions Precautions Precautions: Fall Restrictions Weight Bearing Restrictions Per Provider Order: No     Mobility  Bed Mobility Overal bed mobility: Needs Assistance Bed Mobility: Supine to Sit, Sit to Supine     Supine to sit: HOB  elevated, Mod assist Sit to supine: Min assist   General bed mobility comments: Mod A for trunk elevation to sit to EOB and min A for BLE elevation back to EOB. Cues for sequencing and technique    Transfers Overall transfer level: Needs assistance Equipment used: Rolling walker (2 wheels) Transfers: Sit to/from Stand Sit to Stand: Min assist, From elevated surface           General transfer comment: Light min A to stand from EOB and recliner    Ambulation/Gait   Gait Distance (Feet): 25 Feet Assistive device: Rolling walker (2 wheels) Gait Pattern/deviations: Step-through pattern, Trunk flexed, Decreased stride length       General Gait Details: slow gait with heavy reliance on RW. quick fatigue requiring chair follow for safety and a seated rest break       Balance Overall balance assessment: Needs assistance Sitting-balance support: Feet supported Sitting balance-Leahy Scale: Fair Sitting balance - Comments: sitting EOB   Standing balance support: Bilateral upper extremity supported, During functional activity, Reliant on assistive device for balance Standing balance-Leahy Scale: Poor Standing balance comment: with RW support                            Cognition Arousal: Alert Behavior During Therapy: WFL for tasks assessed/performed, Agitated Overall Cognitive Status: History of cognitive impairments - at baseline  Exercises      General Comments General comments (skin integrity, edema, etc.): Wife present and supportive throughout      Pertinent Vitals/Pain Pain Assessment Pain Assessment: Faces Faces Pain Scale: Hurts little more Pain Location: drain site, back Pain Descriptors / Indicators: Grimacing, Discomfort Pain Intervention(s): Limited activity within patient's tolerance, Monitored during session     PT Goals (current goals can now be found in the care plan section)  Acute Rehab PT Goals Patient Stated Goal: wants to get home PT Goal Formulation: With patient/family Time For Goal Achievement: 02/03/23 Progress towards PT goals: Progressing toward goals    Frequency    Min 1X/week       AM-PAC PT "6 Clicks" Mobility   Outcome Measure  Help needed turning from your back to your side while in a flat bed without using bedrails?: A Lot Help needed moving from lying on your back to sitting on the side of a flat bed without using bedrails?: A Lot Help needed moving to and from a bed to a chair (including a wheelchair)?: A Lot Help needed standing up from a chair using your arms (e.g., wheelchair or bedside chair)?: A Little Help needed to walk in hospital room?: A Lot Help needed climbing 3-5 steps with a railing? : Total 6 Click Score: 12    End of Session Equipment Utilized During Treatment: Gait belt Activity Tolerance: Patient tolerated treatment well;Patient limited by fatigue Patient left: in bed;with call bell/phone within reach;with family/visitor present Nurse Communication: Mobility status PT Visit Diagnosis: Unsteadiness on feet (R26.81);Other abnormalities of gait and mobility (R26.89);Muscle weakness (generalized) (M62.81);Difficulty in walking, not elsewhere classified (R26.2)     Time: 8295-6213 PT Time Calculation (min) (ACUTE ONLY): 21 min  Charges:    $Gait Training: 8-22 mins PT General Charges $$ ACUTE PT VISIT: 1 Visit                     Johny Shock, PTA Acute Rehabilitation Services Secure Chat Preferred  [phone]    Johny Shock 01/21/2023, 11:03 AM

## 2023-01-21 NOTE — Discharge Summary (Signed)
Physician Discharge Summary  Troy Randall ZOX:[id number] DOB: 1960-05-02 DOA: 01/17/2023  PCP: Benita Stabile, MD  Admit date: 01/17/2023  Discharge date: 01/21/2023  Admitted From: Home.  Disposition:  Home Health Services.  Recommendations for Outpatient Follow-up:  Follow up with PCP in 1-2 weeks. Please obtain BMP/CBC in one week. Advised to follow-up with General surgery and IR as scheduled. Advised to take Duricef 500 mg twice daily and Flagyl 500 mg 3 times daily for 3 more days.  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  Tbili 4.1 and AST/ALT >300.  Started on Rocephin/Flagyl and admitted for IR drain exchange.  Patient underwent anterograde cholangiogram and the stones were removed and flush it out. Postprocedure cholangiogram reportedly clear.  Patient was monitored,  LFTs improved and back to normal.  IR recommended outpatient follow-up in 4 weeks.  Patient feels much better,  abdominal pain has improved and wants to be discharged.  Patient is being discharged home on Duricef 500 mg twice daily and Flagyl 500 mg 3 times daily for 3 more days.  Discharge Diagnoses:  Principal Problem:   Sepsis (HCC) Active Problems:   Biliary drain displacement   Acute disseminated encephalomyelitis   Hyperlipidemia   DM2 (diabetes mellitus, type 2) (HCC)   Essential hypertension    Cerebrovascular disease   Hypokalemia   Sepsis Secondary to choledocholithiasis: Patient presented with fever, leukocytosis, and SOFA score 4 (thrombocytopenia, decreased mentation, and elevated bilirubin).   Suspected source gallbladder over urinary tract Initially, there was plan to exchange GB tube on 1/13 and for GI to perform ERCP later. After GB tube exchange, there was heavy leakage around the tube overnight on 1/13 and so on 1/14 IR removed the tube, did anterograde cholangiogram and were able to break up the CBD stone and flush it out. Post procedure cholangiogram reportedly clear.   Continue Rocephin and flagyl, day 3 of 5 Blood cultures no growth so far, urine culture multiple species. Patient has significant drain output. Trend LFTs GI will sign off; if LFTs improve, and tolerating diet and no fever, can be discharged to home with IR and Gen Surg follow up. Needs to reestablish with Gen Surg after discharge for surgical management of cholecystitis. LFTs improved, Patient tolerating diet.  Patient being discharged home,  Sepsis resolved.   Acute disseminated encephalomyelitis: This is followed by Dr. Epimenio Foot.  Exact etiology unclear.   ADEM favored over MS. Not on disease specific therapy at present.   Hyperlipidemia: Resume Crestor.   Hypokalemia: Replaced. Continue to monitor   Cerebrovascular disease: Resume Crestor   Essential hypertension: BP controlled. Continue metoprolol. Continue PRN hydralazine.   DM2 (diabetes mellitus, type 2) (HCC) HbA1c good. Glucose normal Hold Tresiba Continue SS corrections   Cognitive impairment: Patient has chronic cognitive impairment (for example typically does not know the date or day of the week), but is able to function well at home.  Discharge Instructions  Discharge Instructions     Call  MD for:  persistant dizziness or light-headedness   Complete by: As directed    Call MD for:  persistant nausea and vomiting    Complete by: As directed    Call MD for:  redness, tenderness, or signs of infection (pain, swelling, redness, odor or green/yellow discharge around incision site)   Complete by: As directed    Diet - low sodium heart healthy   Complete by: As directed    Diet Carb Modified   Complete by: As directed    Discharge instructions   Complete by: As directed    Advised to follow-up with primary care physician in 1 week. Advised to follow-up with general surgery and IR as scheduled. Advised to take Duricef 500 mg twice daily and Flagyl 500 mg 3 times daily for 3 more days.   Increase activity slowly   Complete by: As directed       Allergies as of 01/21/2023       Reactions   Bactrim [sulfamethoxazole-trimethoprim] Rash   Amoxicillin Hives   Tolerated Cefepime        Medication List     STOP taking these medications    methocarbamol 500 MG tablet Commonly known as: ROBAXIN   nitrofurantoin (macrocrystal-monohydrate) 100 MG capsule Commonly known as: MACROBID       TAKE these medications    BD Veo Insulin Syringe U/F 31G X 15/64" 1 ML Misc Generic drug: Insulin Syringe-Needle U-100 USE AS DIRECTED   Benadryl Allergy 25 MG tablet Generic drug: diphenhydrAMINE Take 25 mg by mouth every 6 (six) hours as needed for allergies.   cefadroxil 500 MG capsule Commonly known as: DURICEF Take 1 capsule (500 mg total) by mouth 2 (two) times daily for 3 days.   glucose blood test strip Commonly known as: OneTouch Verio TEST twice a day   Insulin Pen Needle 32G X 5 MM Misc Commonly known as: NovoTwist Use two daily to inject Victoza and Toujeo.   metoprolol succinate 25 MG 24 hr tablet Commonly known as: TOPROL-XL Take 0.5 tablets (12.5 mg total) by mouth daily.   metroNIDAZOLE 500 MG tablet Commonly known as: Flagyl Take 1 tablet (500 mg total) by mouth 3 (three) times daily for 3 days.   OneTouch Delica Lancets 33G Misc Use to check blood sugar once a day dx code  E11.65   pantoprazole 40 MG tablet Commonly known as: PROTONIX Take 1 tablet (40 mg total) by mouth daily. What changed: when to take this   potassium chloride 10 MEQ tablet Commonly known as: KLOR-CON Take 10 mEq by mouth daily.   PROBIOTIC PO Take 1 tablet by mouth daily.   rosuvastatin 40 MG tablet Commonly known as: Crestor Take 1 tablet (40 mg total) by mouth daily.   traZODone 50 MG tablet Commonly known as: DESYREL Take 0.5-1 tablets (25-50 mg total) by mouth at bedtime as needed for sleep. What changed:  how much to take when to take this   Tresiba FlexTouch 200 UNIT/ML FlexTouch Pen Generic drug: insulin degludec Inject under skin 15 units twice a day. What changed:  how much to take how to take this when to take this additional instructions   TYLENOL 500 MG tablet Generic drug: acetaminophen Take 1 tablet (500 mg total) by mouth every 6 (six) hours as needed for mild pain or headache.   Vitamin C 500 MG Chew Chew 1 tablet by mouth daily.        Follow-up Information     [facility] Follow up.   Specialty: Home Health Services Why: Someone will call you to schedule resumption of care visit. Contact information: [address] [place] [address] [phone]         Benita Stabile, MD Follow up.   Specialty: Internal Medicine Why: call for appt in 7-10 days for hospital follow up Contact information: [address] [place] [facility] [address] [phone]         [facility]  Follow up in 1 week(s).   Specialty: General Surgery Contact information: [address] [place] [address] [phone]         [facility] Follow up.   Why: Please follow up with Dr. Milford Cage in 4 weeks for a drain evaluation and possible drain removal. A scheduler from our clinic will call you with a date/time of your appointment. Please call our office with questions/concerns prior to  your appoinment. Contact information: [address] [place] [address] [phone]                Allergies  Allergen Reactions   Bactrim [Sulfamethoxazole-Trimethoprim] Rash   Amoxicillin Hives    Tolerated Cefepime    Consultations: General surgery Interventional radiology   Procedures/Studies: IR EXCHANGE BILIARY DRAIN Result Date: 01/20/2023 INDICATION: Biliary drainage catheter leakage. Choledocholithiasis. History of acute cholecystitis. EXAM: Procedures: 1. ANTEROGRADE CHOLANGIOGRAM 2. BALLOON SWEEP OF THE COMMON BILE DUCT 3. EXCHANGE of INTERNAL/EXTERNAL BILIARY DRAIN via CHOLECYSTOSTOMY ACCESS COMPARISON:  IR fluoroscopy, 01/18/2023.  CT AP, 01/25/2023. MEDICATIONS: None ANESTHESIA/SEDATION: Moderate (conscious) sedation was employed during this procedure. A total of Versed 2.5 mg and Fentanyl 150 mcg was administered intravenously. Moderate Sedation Time: 46 minutes. The patient's level of consciousness and vital signs were monitored continuously by radiology nursing throughout the procedure under my direct supervision. CONTRAST:  65 mL Omnipaque 300-administered into the gallbladder lumen. FLUOROSCOPY TIME:  Fluoroscopic dose; 414 mGy COMPLICATIONS: None immediate. PROCEDURE: Informed written consent was obtained from the patient and/or patient's representative after a discussion of the risks, benefits and alternatives to treatment. Questions regarding the procedure were encouraged and answered. A timeout was performed prior to the initiation of the procedure. The external portion of the existing RIGHT-sided percutaneous biliary drainage catheter as well as the surrounding skin were prepped and draped in the usual sterile fashion. A sterile drape was applied covering the operative field. Maximum barrier sterile technique with sterile gowns and gloves were used for the procedure. A timeout was performed prior to the initiation of the procedure. A pre procedural spot  fluoroscopic image was obtained after contrast was injected via the existing biliary drainage catheter. The existing biliary drainage catheter was cut and cannulated with a 0.035 inch Amplatz wire which was coiled within the proximal small bowel. A 7 Fr 23 cm Brite tip sheath was inserted over wire to the cystic duct and CBD confluence. Balloon sweeps of the common bile duct or performed with a 10 x 40 mm Conquest balloon. Final cholangiogram was obtained. Under intermittent fluoroscopic guidance, the sheath was removed and a new 12 Fr biliary drain was placed. Small amount of contrast was injected via the catheter and a post exchange spot fluoroscopic image was obtained. The catheter was locked and secured to the skin with a single interrupted suture. A dressing was placed. The patient tolerated the procedure well without immediate postprocedural complication. FINDINGS: Choledocholithiasis with prominent 1.2 cm common bile duct stone. Biliary angioplasty and antegrade  balloon sweep for choledocholithiasis without residual burden of common bile duct stones. Exchange for a new 12 Fr internalized/externalized biliary drainage catheter via the cholecystostomy access IMPRESSION: 1. Successful fluoroscopic-guided biliary angioplasty and sweep for clearance of choledocholithiasis 2. Exchange for a new 12 Fr internal/externalized biliary drainage catheter via the cholecystostomy access. PLAN: After adequate decompression OK to begin capping trial for biliary drain and flush with 5 mL NS B.I.D. The patient will return to interventional radiology for routine drainage catheter and evaluation in 4 weeks. Roanna Banning, MD [facility] Electronically Signed   By: Roanna Banning M.D.   On: 01/20/2023 18:44   IR REMOVAL OF CALCULI/DEBRIS BILIARY DUCT/GB Result Date: 01/20/2023 INDICATION: Biliary drainage catheter leakage. Choledocholithiasis. History of acute  cholecystitis. EXAM: Procedures: 1. ANTEROGRADE CHOLANGIOGRAM 2. BALLOON SWEEP OF THE COMMON BILE DUCT 3. EXCHANGE of INTERNAL/EXTERNAL BILIARY DRAIN via CHOLECYSTOSTOMY ACCESS COMPARISON:  IR fluoroscopy, 01/18/2023.  CT AP, 01/25/2023. MEDICATIONS: None ANESTHESIA/SEDATION: Moderate (conscious) sedation was employed during this procedure. A total of Versed 2.5 mg and Fentanyl 150 mcg was administered intravenously. Moderate Sedation Time: 46 minutes. The patient's level of consciousness and vital signs were monitored continuously by radiology nursing throughout the procedure under my direct supervision. CONTRAST:  65 mL Omnipaque 300-administered into the gallbladder lumen. FLUOROSCOPY TIME:  Fluoroscopic dose; 414 mGy COMPLICATIONS: None immediate. PROCEDURE: Informed written consent was obtained from the patient and/or patient's representative after a discussion of the risks, benefits and alternatives to treatment. Questions regarding the procedure were encouraged and answered. A timeout was performed prior to the initiation of the procedure. The external portion of the existing RIGHT-sided percutaneous biliary drainage catheter as well as the surrounding skin were prepped and draped in the usual sterile fashion. A sterile drape was applied covering the operative field. Maximum barrier sterile technique with sterile gowns and gloves were used for the procedure. A timeout was performed prior to the initiation of the procedure. A pre procedural spot fluoroscopic image was obtained after contrast was injected via the existing biliary drainage catheter. The existing biliary drainage catheter was cut and cannulated with a 0.035 inch Amplatz wire which was coiled within the proximal small bowel. A 7 Fr 23 cm Brite tip sheath was inserted over wire to the cystic duct and CBD confluence. Balloon sweeps of the common bile duct or performed with a 10 x 40 mm Conquest balloon. Final cholangiogram was obtained. Under  intermittent fluoroscopic guidance, the sheath was removed and a new 12 Fr biliary drain was placed. Small amount of contrast was injected via the catheter and a post exchange spot fluoroscopic image was obtained. The catheter was locked and secured to the skin with a single interrupted suture. A dressing was placed. The patient tolerated the procedure well without immediate postprocedural complication. FINDINGS: Choledocholithiasis with prominent 1.2 cm common bile duct stone. Biliary angioplasty and antegrade balloon sweep for choledocholithiasis without residual burden of common bile duct stones. Exchange for a new 12 Fr internalized/externalized biliary drainage catheter via the cholecystostomy access IMPRESSION: 1. Successful fluoroscopic-guided biliary angioplasty and sweep for clearance of choledocholithiasis 2. Exchange for a new 12 Fr internal/externalized biliary drainage catheter via the cholecystostomy access. PLAN: After adequate decompression OK to begin capping trial for biliary drain and flush with 5 mL NS B.I.D. The patient will return to interventional radiology for routine drainage catheter and evaluation in 4 weeks. Roanna Banning, MD [facility] Radiology Electronically Signed   By: Cletis Athens  Mugweru M.D.   On: 01/20/2023 18:44   IR EXCHANGE BILIARY DRAIN Result Date: 01/20/2023 INDICATION: History of acute cholecystitis, post ultrasound fluoroscopic guided cholecystostomy tube placement on 09/08/2021. Reported Pericatheter leakage. EXAM: CHOLECYSTOSTOMY TUBE EVALUATION, INTERNALIZATION and EXCHANGE COMPARISON:  IR fluoroscopy, 11/19/2022.  CT AP, 01/17/2023 MEDICATIONS: None ANESTHESIA/SEDATION: Local anesthetic was administered. CONTRAST:  15mL OMNIPAQUE IOHEXOL 300 MG/ML SOLN - administered into the gallbladder lumen. FLUOROSCOPY TIME:  Fluoroscopic dose; 27 mGy COMPLICATIONS: None immediate. PROCEDURE: The patient was positioned supine on the  fluoroscopy table. The external portion of the existing cholecystostomy tube as well as the surrounding skin was prepped and draped in usual sterile fashion. A time-out was performed prior to the initiation of the procedure. A preprocedural spot fluoroscopic image was obtained of the right upper abdominal quadrant existing cholecystostomy tube. The skin surrounding the cholecystostomy tube was anesthetized with 1% lidocaine with epinephrine. The external portion of the cholecystostomy tube was cut and cannulated with a short Amplatz wire which was advanced through the tube and coiled within the gallbladder lumen. The pre-existing drain was removed. A 5 Fr Kumpe catheter was inserted over the wire, and used to direct a 0.035 inch glidewire through the cystic duct then the common bile duct and into the duodenal. Next, under intermittent fluoroscopic guidance, a new 12 Fr biliary drain was inserted, with tip positioned within duodenum. Contrast injection confirms appropriate positioning and functionality of the internalized biliary drain. The tube was flushed with a small amount of saline and reconnected to a gravity bag. The biliary drain was secured with an interrupted suture and a Stat Lock device. A dressing was applied. The patient tolerated the procedure well without immediate postprocedural complication. FINDINGS: *Preprocedural spot fluoroscopic image demonstrates unchanged positioning of cholecystostomy tube with end coiled and locked over the expected location of the fundus of the gallbladder *After fluoroscopic guided internalization and exchange, a new 12 Fr biliary drain is more ideally positioned with end coiled and locked within the duodenum.Post exchange cholangiogram demonstrates appropriate positioning and functionality of the new cholecystostomy tube. *Choledocholithiasis with a dominant > 1 cm stone. IMPRESSION: 1. Successful fluoroscopic guided evaluation, exchange and internalization to a 12 Fr  biliary drain. 2. Choledocholithiasis, with a prominent 1.2 cm stone within the common bile duct. Roanna Banning, MD [facility] Radiology Electronically Signed   By: Roanna Banning M.D.   On: 01/20/2023 18:25   US Abdomen Limited RUQ (LIVER/GB) Result Date: 01/17/2023 CLINICAL DATA:  151471 RUQ pain 151471 history of cholecystitis with cholecystectomy injuring. Concern for cholangitis. EXAM: ULTRASOUND ABDOMEN LIMITED RIGHT UPPER QUADRANT COMPARISON:  None Available. FINDINGS: Nondiagnostic images obtained due to placement of drain, bandaging, patient pain as per ultrasound tech. IMPRESSION: Nondiagnostic images obtained due to placement of drain, bandaging, patient pain as per ultrasound tech. Electronically Signed   By: Tish Frederickson M.D.   On: 01/17/2023 22:52   CT ABDOMEN PELVIS W CONTRAST Result Date: 01/17/2023 CLINICAL DATA:  Abdominal pain, acute, nonlocalized fever, AMS, h/o kidney stones, s/p percutaneous choly drain c/f obstruction EXAM: CT ABDOMEN AND PELVIS WITH CONTRAST TECHNIQUE: Multidetector CT imaging of the abdomen and pelvis was performed using the standard protocol following bolus administration of intravenous contrast. RADIATION DOSE REDUCTION: This exam was performed according to the departmental dose-optimization program which includes automated exposure control, adjustment of the mA and/or kV according to patient size and/or use of iterative reconstruction technique. CONTRAST:  75mL OMNIPAQUE IOHEXOL 350 MG/ML SOLN COMPARISON:  Abdominal CT 04/01/2022  FINDINGS: Lower chest: Breathing motion artifact. No confluent airspace disease or pleural effusion allowing for motion. Hepatobiliary: Motion in the upper abdomen limits assessment. Cholecystostomy tube appears to be coiled in the gallbladder fundus. The gallbladder is decompressed. Small gallstones which are not well assessed due to motion. There is mild central intrahepatic biliary ductal  dilatation. The common bile duct is poorly assessed on this motion degraded exam, measures approximately 9 mm, series 3, image 34. There is no obvious choledocholithiasis. Pancreas: Fatty atrophy. No evidence of inflammation allowing for motion. Spleen: Normal in size without focal abnormality. Adrenals/Urinary Tract: No adrenal nodule. No hydronephrosis. Nonobstructing stones in the mid right kidney. No evidence of ureteral stone. Foley catheter decompresses the urinary bladder. There are layering stones in the bladder. Mild diffuse bladder wall thickening. Right renal cyst. No further follow-up imaging is recommended. Stomach/Bowel: The stomach is nondistended. Duodenal diverticula as before. No bowel obstruction. There is no obvious bowel inflammation however motion limits assessment multiple bowel loops. Normal appendix is visualized. Small volume of stool in the colon. Vascular/Lymphatic: Normal caliber abdominal aorta, mild atherosclerosis. Retroaortic left renal vein. Coils in the central small bowel mesentery. Portal vein appears to be patent. No suspicious lymphadenopathy. Reproductive: Unremarkable prostate. Other: No ascites or free air.  Prior umbilical hernia repair. Musculoskeletal: No acute osseous finding allowing for motion artifact limitations IMPRESSION: 1. Cholecystostomy tube appears to be coiled in the gallbladder fundus. The gallbladder is decompressed. Small gallstones which are not well assessed due to motion. 2. Mild central intrahepatic biliary ductal dilatation. The common bile duct is poorly assessed on this motion degraded exam, measures approximately 9 mm. No obvious choledocholithiasis. Recommend correlation with LFTs. Ultrasound could be considered as clinically indicated. MRCP is not recommended for this patient given motion on CT 3. Nonobstructing right renal stones. Layering stones in the bladder. Foley catheter decompresses the urinary bladder. Mild diffuse bladder wall  thickening, recommend correlation with urinalysis. Aortic Atherosclerosis (ICD10-I70.0). Electronically Signed   By: Narda Rutherford M.D.   On: 01/17/2023 19:14     Subjective: Patient was seen and examined at bedside.  Overnight events noted.   Patient reports doing much better,  abdominal pain is better ,  tolerating diet well.   Drain is working perfect.  Patient wants to be discharged home.  Discharge Exam: Vitals:   01/21/23 0417 01/21/23 0751  BP: (!) 148/93 (!) 150/80  Pulse: 63 65  Resp: 18 19  Temp: 97.7 F (36.5 C)   SpO2: 96% 96%   Vitals:   01/20/23 1607 01/20/23 2115 01/21/23 0417 01/21/23 0751  BP: (!) 153/77 (!) 153/83 (!) 148/93 (!) 150/80  Pulse: 63 64 63 65  Resp:  18 18 19   Temp: 98.3 F (36.8 C) 97.9 F (36.6 C) 97.7 F (36.5 C)   TempSrc: Oral     SpO2: 99% 97% 96% 96%  Weight:      Height:        General: Pt is alert, awake, not in acute distress Cardiovascular: RRR, S1/S2 +, no rubs, no gallops Respiratory: CTA bilaterally, no wheezing, no rhonchi Abdominal: Soft, NT, ND, bowel sounds + Extremities: no edema, no cyanosis    The results of significant diagnostics from this hospitalization (including imaging, microbiology, ancillary and laboratory) are listed below for reference.     Microbiology: Recent Results (from the past 240 hours)  Culture, blood (routine x 2)     Status: None (Preliminary result)   Collection Time: 01/17/23  4:09 PM  Specimen: BLOOD  Result Value Ref Range Status   Specimen Description BLOOD BLOOD LEFT ARM  Final   Special Requests   Final    BOTTLES DRAWN AEROBIC AND ANAEROBIC Blood Culture adequate volume   Culture   Final    NO GROWTH 4 DAYS Performed at [facility], [address] [place], [place] [address]    Report Status PENDING  Incomplete  Culture, blood (routine x 2)     Status: None (Preliminary result)   Collection Time: 01/17/23  4:10 PM   Specimen: BLOOD  Result Value Ref Range Status    Specimen Description BLOOD RIGHT ANTECUBITAL  Final   Special Requests   Final    AEROBIC BOTTLE ONLY Blood Culture results may not be optimal due to an inadequate volume of blood received in culture bottles   Culture   Final    NO GROWTH 4 DAYS Performed at [facility], [address], [place], [place] [address]    Report Status PENDING  Incomplete  Culture, Urine (Do not remove urinary catheter, catheter placed by urology or difficult to place)     Status: Abnormal   Collection Time: 01/17/23  9:02 PM   Specimen: Urine, Catheterized  Result Value Ref Range Status   Specimen Description URINE, CATHETERIZED  Final   Special Requests   Final    NONE Performed at [facility], [address] [place], [place] [id number]    Culture MULTIPLE SPECIES PRESENT, SUGGEST RECOLLECTION (A)  Final   Report Status 01/20/2023 FINAL  Final     Labs: BNP (last 3 results) No results for input(s): "BNP" in the last 8760 hours. Basic Metabolic Panel: Recent Labs  Lab 01/17/23 1609 01/18/23 0405 01/19/23 0402 01/20/23 0413 01/21/23 0408  NA 138 136 138 137 136  K 3.0* 2.9* 3.9 3.5 2.8*  CL 99 102 105 106 106  CO2 26 25 26 23 23   GLUCOSE 145* 127* 139* 125* 148*  BUN 14 10 8 9 8   CREATININE 1.11 0.78 0.62 0.62 0.55*  CALCIUM 9.0 8.1* 8.6* 8.6* 8.5*  MG  --   --   --   --  2.1  PHOS  --   --   --   --  3.7   Liver Function Tests: Recent Labs  Lab 01/17/23 1609 01/18/23 0405 01/19/23 0402 01/20/23 0413 01/21/23 0408  AST 386* 177* 67* 30 18  ALT 473* 288* 190* 120* 88*  ALKPHOS 288* 230* 229* 196* 160*  BILITOT 4.1* 1.8* 2.9* 1.4* 0.7  PROT 6.9 5.8* 6.0* 5.7* 5.8*  ALBUMIN 3.3* 2.7* 2.7* 2.6* 2.6*   Recent Labs  Lab 01/17/23 1609  LIPASE 49   Recent Labs  Lab 01/17/23 1609  AMMONIA 12   CBC: Recent Labs  Lab 01/17/23 1609 01/18/23 0405 01/19/23 0402 01/20/23 0413 01/21/23 0408  WBC 11.5* 8.5 5.2 6.5 5.5  NEUTROABS 10.3*  --   --   --   --   HGB  13.1 11.2* 11.9* 11.8* 11.7*  HCT 38.7* 33.0* 34.5* 34.5* 34.4*  MCV 84.5 84.4 82.3 83.1 83.1  PLT 141* 117* 119* 120* 142*   Cardiac Enzymes: No results for input(s): "CKTOTAL", "CKMB", "CKMBINDEX", "TROPONINI" in the last 168 hours. BNP: Invalid input(s): "POCBNP" CBG: Recent Labs  Lab 01/20/23 1216 01/20/23 1604 01/20/23 2110 01/21/23 0748 01/21/23 1144  GLUCAP 150* 169* 157* 146* 192*   D-Dimer No results for input(s): "DDIMER" in the last 72 hours.  Hgb A1c No results for input(s): "HGBA1C" in the last 72 hours. Lipid Profile No results for input(s): "CHOL", "HDL", "LDLCALC", "TRIG", "CHOLHDL", "LDLDIRECT" in the last 72 hours. Thyroid function studies No results for input(s): "TSH", "T4TOTAL", "T3FREE", "THYROIDAB" in the last 72 hours.  Invalid input(s): "FREET3" Anemia work up No results for input(s): "VITAMINB12", "FOLATE", "FERRITIN", "TIBC", "IRON", "RETICCTPCT" in the last 72 hours. Urinalysis    Component Value Date/Time   COLORURINE AMBER (A) 01/17/2023 1637   APPEARANCEUR CLOUDY (A) 01/17/2023 1637   LABSPEC 1.028 01/17/2023 1637   PHURINE 5.0 01/17/2023 1637   GLUCOSEU NEGATIVE 01/17/2023 1637   GLUCOSEU >=1000 (A) 12/12/2014 1555   HGBUR NEGATIVE 01/17/2023 1637   BILIRUBINUR MODERATE (A) 01/17/2023 1637   KETONESUR NEGATIVE 01/17/2023 1637   PROTEINUR 100 (A) 01/17/2023 1637   UROBILINOGEN 0.2 12/12/2014 1555   NITRITE POSITIVE (A) 01/17/2023 1637   LEUKOCYTESUR MODERATE (A) 01/17/2023 1637   Sepsis Labs Recent Labs  Lab 01/18/23 0405 01/19/23 0402 01/20/23 0413 01/21/23 0408  WBC 8.5 5.2 6.5 5.5   Microbiology Recent Results (from the past 240 hours)  Culture, blood (routine x 2)     Status: None (Preliminary result)   Collection Time: 01/17/23  4:09 PM   Specimen: BLOOD  Result Value Ref Range Status   Specimen Description BLOOD BLOOD LEFT ARM  Final   Special Requests   Final    BOTTLES DRAWN AEROBIC AND ANAEROBIC Blood Culture  adequate volume   Culture   Final    NO GROWTH 4 DAYS Performed at [facility], [address], [place], [place] [address]    Report Status PENDING  Incomplete  Culture, blood (routine x 2)     Status: None (Preliminary result)   Collection Time: 01/17/23  4:10 PM   Specimen: BLOOD  Result Value Ref Range Status   Specimen Description BLOOD RIGHT ANTECUBITAL  Final   Special Requests   Final    AEROBIC BOTTLE ONLY Blood Culture results may not be optimal due to an inadequate volume of blood received in culture bottles   Culture   Final    NO GROWTH 4 DAYS Performed at [facility], [address] [place], [place] [address]    Report Status PENDING  Incomplete  Culture, Urine (Do not remove urinary catheter, catheter placed by urology or difficult to place)     Status: Abnormal   Collection Time: 01/17/23  9:02 PM   Specimen: Urine, Catheterized  Result Value Ref Range Status   Specimen Description URINE, CATHETERIZED  Final   Special Requests   Final    NONE Performed at [facility], [address], [place], [place] [address]    Culture MULTIPLE SPECIES PRESENT, SUGGEST RECOLLECTION (A)  Final   Report Status 01/20/2023 FINAL  Final     Time coordinating discharge: Over 30 minutes  SIGNED:   Willeen Niece, MD  [facility] 01/21/2023, 1:10 PM Pager   If 7PM-7AM, please contact night-coverage

## 2023-01-21 NOTE — TOC Transition Note (Addendum)
Transition of Care [facility]) - Discharge Note   Patient Details  Name: Troy Randall MRN: [id number] Date of Birth: 31-Oct-1960  [facility] CM/SW Contact:  Lockie Pares, RN Phone Number: 01/21/2023, 10:01 AM   Clinical Narrative:    Will transition to home today. Ms Somoza transports patient. Home normally, she is requesting PTAR when discharged. RN tells this RNCM that the patient is getting runs of potassium He will let me know when ready for [facility] serevices reordered.  [facility] Accepted   Final next level of care: Home w Home Health Services Barriers to Discharge: No Barriers Identified   Patient Goals and CMS Choice            Discharge Placement             Home with home health          Discharge Plan and Services Additional resources added to the After Visit Summary for                            [facility] Arranged: PT, OT, RN          Social Drivers of Health (SDOH) Interventions SDOH Screenings   Food Insecurity: No Food Insecurity (01/18/2023)  Housing: Low Risk  (01/18/2023)  Transportation Needs: No Transportation Needs (01/18/2023)  Utilities: Not At Risk (01/18/2023)  Depression (PHQ2-9): High Risk (01/08/2022)  Social Connections: Moderately Integrated (01/18/2023)  Tobacco Use: Medium Risk (01/17/2023)     Readmission Risk Interventions    01/20/2023    4:13 PM 04/03/2022   12:24 PM  Readmission Risk Prevention Plan  Post Dischage Appt Complete   Medication Screening Complete   Transportation Screening Complete Complete  PCP or Specialist Appt within 5-7 Days  Complete  Medication Review (RN CM)  Complete

## 2023-01-21 NOTE — Progress Notes (Signed)
Supervising Physician: Roanna Banning  Patient Status:  [facility] - In-pt  Chief Complaint: Choledocholithiasis with cholecystostomy now s/p biliary angioplasty and internal/external biliary drainage catheter placed via cholecystostomy access 01/19/23 with Dr. Milford Cage   Subjective: Patient awake/alert in bed and is preparing for discharge home. His wife is at the bedside.    Allergies: Bactrim [sulfamethoxazole-trimethoprim] and Amoxicillin  Medications: Prior to Admission medications   Medication Sig Start Date End Date Taking? Authorizing Provider  Ascorbic Acid (VITAMIN C) 500 MG CHEW Chew 1 tablet by mouth daily.   Yes [provider]  cefadroxil (DURICEF) 500 MG capsule Take 1 capsule (500 mg total) by mouth 2 (two) times daily for 3 days. 01/21/23 01/24/23 Yes Willeen Niece, MD  diphenhydrAMINE (BENADRYL ALLERGY) 25 MG tablet Take 25 mg by mouth every 6 (six) hours as needed for allergies.   Yes [provider]  metoprolol succinate (TOPROL-XL) 25 MG 24 hr tablet Take 0.5 tablets (12.5 mg total) by mouth daily. 04/03/22  Yes Rhetta Mura, MD  metroNIDAZOLE (FLAGYL) 500 MG tablet Take 1 tablet (500 mg total) by mouth 3 (three) times daily for 3 days. 01/21/23 01/24/23 Yes Willeen Niece, MD  pantoprazole (PROTONIX) 40 MG tablet Take 1 tablet (40 mg total) by mouth daily. Patient taking differently: Take 40 mg by mouth daily before breakfast. 11/27/21  Yes Setzer, Lynnell Jude, PA-C  Probiotic Product (PROBIOTIC PO) Take 1 tablet by mouth daily.   Yes [provider]  rosuvastatin (CRESTOR) 40 MG tablet Take 1 tablet (40 mg total) by mouth daily. 04/29/21  Yes Azucena Fallen, MD  traZODone (DESYREL) 50 MG tablet Take 0.5-1 tablets (25-50 mg total) by mouth at bedtime as needed for sleep. Patient taking differently: Take 50 mg by mouth at bedtime. 11/26/21  Yes Setzer, Lynnell Jude, PA-C  TRESIBA FLEXTOUCH 200 UNIT/ML FlexTouch Pen Inject under skin 15 units  twice a day. Patient taking differently: Inject 18 Units into the skin See admin instructions. Inject 18 units into the skin daily- BGL must be 130 or greater 11/26/21  Yes Setzer, Lynnell Jude, PA-C  TYLENOL 500 MG tablet Take 1 tablet (500 mg total) by mouth every 6 (six) hours as needed for mild pain or headache. 04/03/22  Yes Rhetta Mura, MD  BD VEO INSULIN SYRINGE U/F 31G X 15/64" 1 ML MISC  USE AS DIRECTED 06/08/17   Aliene Beams, MD  glucose blood (ONETOUCH VERIO) test strip TEST twice a day 05/04/17   Aliene Beams, MD  Insulin Pen Needle (NOVOTWIST) 32G X 5 MM MISC Use two daily to inject Victoza and Toujeo. 04/25/15   Reather Littler, MD  methocarbamol (ROBAXIN) 500 MG tablet Take 1 tablet (500 mg total) by mouth every 6 (six) hours as needed for muscle spasms. Patient not taking: Reported on 01/17/2023 11/26/21   Milinda Antis, PA-C  nitrofurantoin, macrocrystal-monohydrate, (MACROBID) 100 MG capsule Take 100 mg by mouth 2 (two) times daily. Patient not taking: Reported on 01/17/2023    [provider]  Dola Argyle LANCETS 33G MISC Use to check blood sugar once a day dx code E11.65 11/21/14   Reather Littler, MD  potassium chloride (KLOR-CON) 10 MEQ tablet Take 10 mEq by mouth daily. Patient not taking: Reported on 01/17/2023    [provider]     Vital Signs: BP (!) 150/80 (BP Location: Right Arm)   Pulse 65   Temp 97.7 F (36.5 C)   Resp 19   Ht 6' (1.829  m)   Wt 216 lb 14.9 oz (98.4 kg)   SpO2 96%   BMI 29.42 kg/m   Physical Exam Constitutional:      General: He is not in acute distress.    Appearance: He is not ill-appearing.  Cardiovascular:     Rate and Rhythm: Normal rate.  Pulmonary:     Effort: Pulmonary effort is normal.  Abdominal:     Tenderness: There is no abdominal tenderness.     Comments: RUQ drain to gravity. Approximately 100 ml of thin, clear bile in gravity bag. Drain flushed with 5 ml NS. Dressing clean and dry. Drain is now  capped.   Skin:    General: Skin is warm and dry.  Neurological:     Mental Status: He is alert and oriented to person, place, and time.     Imaging: IR EXCHANGE BILIARY DRAIN Result Date: 01/20/2023 INDICATION: Biliary drainage catheter leakage. Choledocholithiasis. History of acute cholecystitis. EXAM: Procedures: 1. ANTEROGRADE CHOLANGIOGRAM 2. BALLOON SWEEP OF THE COMMON BILE DUCT 3. EXCHANGE of INTERNAL/EXTERNAL BILIARY DRAIN via CHOLECYSTOSTOMY ACCESS COMPARISON:  IR fluoroscopy, 01/18/2023.  CT AP, 01/25/2023. MEDICATIONS: None ANESTHESIA/SEDATION: Moderate (conscious) sedation was employed during this procedure. A total of Versed 2.5 mg and Fentanyl 150 mcg was administered intravenously. Moderate Sedation Time: 46 minutes. The patient's level of consciousness and vital signs were monitored continuously by radiology nursing throughout the procedure under my direct supervision. CONTRAST:  65 mL Omnipaque 300-administered into the gallbladder lumen. FLUOROSCOPY TIME:  Fluoroscopic dose; 414 mGy COMPLICATIONS: None immediate. PROCEDURE: Informed written consent was obtained from the patient and/or patient's representative after a discussion of the risks, benefits and alternatives to treatment. Questions regarding the procedure were encouraged and answered. A timeout was performed prior to the initiation of the procedure. The external portion of the existing RIGHT-sided percutaneous biliary drainage catheter as well as the surrounding skin were prepped and draped in the usual sterile fashion. A sterile drape was applied covering the operative field. Maximum barrier sterile technique with sterile gowns and gloves were used for the procedure. A timeout was performed prior to the initiation of the procedure. A pre procedural spot fluoroscopic image was obtained after contrast was injected via the existing biliary drainage catheter. The existing biliary drainage catheter was cut and cannulated with a 0.035  inch Amplatz wire which was coiled within the proximal small bowel. A 7 Fr 23 cm Brite tip sheath was inserted over wire to the cystic duct and CBD confluence. Balloon sweeps of the common bile duct or performed with a 10 x 40 mm Conquest balloon. Final cholangiogram was obtained. Under intermittent fluoroscopic guidance, the sheath was removed and a new 12 Fr biliary drain was placed. Small amount of contrast was injected via the catheter and a post exchange spot fluoroscopic image was obtained. The catheter was locked and secured to the skin with a single interrupted suture. A dressing was placed. The patient tolerated the procedure well without immediate postprocedural complication. FINDINGS: Choledocholithiasis with prominent 1.2 cm common bile duct stone. Biliary angioplasty and antegrade balloon sweep for choledocholithiasis without residual burden of common bile duct stones. Exchange for a new 12 Fr internalized/externalized biliary drainage catheter via the cholecystostomy access IMPRESSION: 1. Successful fluoroscopic-guided biliary angioplasty and sweep for clearance of choledocholithiasis 2. Exchange for a new 12 Fr internal/externalized biliary drainage catheter via the cholecystostomy access. PLAN: After adequate decompression OK to begin capping trial for biliary drain and flush with 5 mL NS B.I.D. The patient  will return to interventional radiology for routine drainage catheter and evaluation in 4 weeks. Roanna Banning, MD [facility] Radiology Electronically Signed   By: Roanna Banning M.D.   On: 01/20/2023 18:44   IR REMOVAL OF CALCULI/DEBRIS BILIARY DUCT/GB Result Date: 01/20/2023 INDICATION: Biliary drainage catheter leakage. Choledocholithiasis. History of acute cholecystitis. EXAM: Procedures: 1. ANTEROGRADE CHOLANGIOGRAM 2. BALLOON SWEEP OF THE COMMON BILE DUCT 3. EXCHANGE of INTERNAL/EXTERNAL BILIARY DRAIN via CHOLECYSTOSTOMY ACCESS COMPARISON:  IR  fluoroscopy, 01/18/2023.  CT AP, 01/25/2023. MEDICATIONS: None ANESTHESIA/SEDATION: Moderate (conscious) sedation was employed during this procedure. A total of Versed 2.5 mg and Fentanyl 150 mcg was administered intravenously. Moderate Sedation Time: 46 minutes. The patient's level of consciousness and vital signs were monitored continuously by radiology nursing throughout the procedure under my direct supervision. CONTRAST:  65 mL Omnipaque 300-administered into the gallbladder lumen. FLUOROSCOPY TIME:  Fluoroscopic dose; 414 mGy COMPLICATIONS: None immediate. PROCEDURE: Informed written consent was obtained from the patient and/or patient's representative after a discussion of the risks, benefits and alternatives to treatment. Questions regarding the procedure were encouraged and answered. A timeout was performed prior to the initiation of the procedure. The external portion of the existing RIGHT-sided percutaneous biliary drainage catheter as well as the surrounding skin were prepped and draped in the usual sterile fashion. A sterile drape was applied covering the operative field. Maximum barrier sterile technique with sterile gowns and gloves were used for the procedure. A timeout was performed prior to the initiation of the procedure. A pre procedural spot fluoroscopic image was obtained after contrast was injected via the existing biliary drainage catheter. The existing biliary drainage catheter was cut and cannulated with a 0.035 inch Amplatz wire which was coiled within the proximal small bowel. A 7 Fr 23 cm Brite tip sheath was inserted over wire to the cystic duct and CBD confluence. Balloon sweeps of the common bile duct or performed with a 10 x 40 mm Conquest balloon. Final cholangiogram was obtained. Under intermittent fluoroscopic guidance, the sheath was removed and a new 12 Fr biliary drain was placed. Small amount of contrast was injected via the catheter and a post exchange spot fluoroscopic image  was obtained. The catheter was locked and secured to the skin with a single interrupted suture. A dressing was placed. The patient tolerated the procedure well without immediate postprocedural complication. FINDINGS: Choledocholithiasis with prominent 1.2 cm common bile duct stone. Biliary angioplasty and antegrade balloon sweep for choledocholithiasis without residual burden of common bile duct stones. Exchange for a new 12 Fr internalized/externalized biliary drainage catheter via the cholecystostomy access IMPRESSION: 1. Successful fluoroscopic-guided biliary angioplasty and sweep for clearance of choledocholithiasis 2. Exchange for a new 12 Fr internal/externalized biliary drainage catheter via the cholecystostomy access. PLAN: After adequate decompression OK to begin capping trial for biliary drain and flush with 5 mL NS B.I.D. The patient will return to interventional radiology for routine drainage catheter and evaluation in 4 weeks. Roanna Banning, MD [facility] Radiology Electronically Signed   By: Roanna Banning M.D.   On: 01/20/2023 18:44   IR EXCHANGE BILIARY DRAIN Result Date: 01/20/2023 INDICATION: History of acute cholecystitis, post ultrasound fluoroscopic guided cholecystostomy tube placement on 09/08/2021. Reported Pericatheter leakage. EXAM: CHOLECYSTOSTOMY TUBE EVALUATION, INTERNALIZATION and EXCHANGE COMPARISON:  IR fluoroscopy, 11/19/2022.  CT AP, 01/17/2023 MEDICATIONS: None ANESTHESIA/SEDATION: Local anesthetic was administered. CONTRAST:  15mL OMNIPAQUE IOHEXOL 300 MG/ML SOLN - administered into the gallbladder lumen. FLUOROSCOPY TIME:  Fluoroscopic dose; 27 mGy COMPLICATIONS: None immediate. PROCEDURE: The patient was positioned supine on the fluoroscopy table. The external portion of the existing cholecystostomy tube as well as the surrounding skin was prepped and draped in usual sterile fashion. A time-out was performed prior to the  initiation of the procedure. A preprocedural spot fluoroscopic image was obtained of the right upper abdominal quadrant existing cholecystostomy tube. The skin surrounding the cholecystostomy tube was anesthetized with 1% lidocaine with epinephrine. The external portion of the cholecystostomy tube was cut and cannulated with a short Amplatz wire which was advanced through the tube and coiled within the gallbladder lumen. The pre-existing drain was removed. A 5 Fr Kumpe catheter was inserted over the wire, and used to direct a 0.035 inch glidewire through the cystic duct then the common bile duct and into the duodenal. Next, under intermittent fluoroscopic guidance, a new 12 Fr biliary drain was inserted, with tip positioned within duodenum. Contrast injection confirms appropriate positioning and functionality of the internalized biliary drain. The tube was flushed with a small amount of saline and reconnected to a gravity bag. The biliary drain was secured with an interrupted suture and a Stat Lock device. A dressing was applied. The patient tolerated the procedure well without immediate postprocedural complication. FINDINGS: *Preprocedural spot fluoroscopic image demonstrates unchanged positioning of cholecystostomy tube with end coiled and locked over the expected location of the fundus of the gallbladder *After fluoroscopic guided internalization and exchange, a new 12 Fr biliary drain is more ideally positioned with end coiled and locked within the duodenum.Post exchange cholangiogram demonstrates appropriate positioning and functionality of the new cholecystostomy tube. *Choledocholithiasis with a dominant > 1 cm stone. IMPRESSION: 1. Successful fluoroscopic guided evaluation, exchange and internalization to a 12 Fr biliary drain. 2. Choledocholithiasis, with a prominent 1.2 cm stone within the common bile duct. Roanna Banning, MD [facility] Radiology  Electronically Signed   By: Roanna Banning M.D.   On: 01/20/2023 18:25   US Abdomen Limited RUQ (LIVER/GB) Result Date: 01/17/2023 CLINICAL DATA:  151471 RUQ pain 151471 history of cholecystitis with cholecystectomy injuring. Concern for cholangitis. EXAM: ULTRASOUND ABDOMEN LIMITED RIGHT UPPER QUADRANT COMPARISON:  None Available. FINDINGS: Nondiagnostic images obtained due to placement of drain, bandaging, patient pain as per ultrasound tech. IMPRESSION: Nondiagnostic images obtained due to placement of drain, bandaging, patient pain as per ultrasound tech. Electronically Signed   By: Tish Frederickson M.D.   On: 01/17/2023 22:52   CT ABDOMEN PELVIS W CONTRAST Result Date: 01/17/2023 CLINICAL DATA:  Abdominal pain, acute, nonlocalized fever, AMS, h/o kidney stones, s/p percutaneous choly drain c/f obstruction EXAM: CT ABDOMEN AND PELVIS WITH CONTRAST TECHNIQUE: Multidetector CT imaging of the abdomen and pelvis was performed using the standard protocol following bolus administration of intravenous contrast. RADIATION DOSE REDUCTION: This exam was performed according to the departmental dose-optimization program which includes automated exposure control, adjustment of the mA and/or kV according to patient size and/or use of iterative reconstruction technique. CONTRAST:  75mL OMNIPAQUE IOHEXOL 350 MG/ML SOLN COMPARISON:  Abdominal CT 04/01/2022 FINDINGS: Lower chest: Breathing motion artifact. No confluent airspace disease or pleural effusion allowing for motion. Hepatobiliary: Motion in the upper abdomen limits assessment. Cholecystostomy tube appears to be coiled in the gallbladder fundus. The gallbladder is decompressed. Small gallstones which are not well assessed due to motion. There is mild central intrahepatic biliary ductal dilatation. The common bile duct is poorly assessed on this motion degraded exam, measures approximately 9 mm, series  3, image 34. There is no obvious choledocholithiasis. Pancreas:  Fatty atrophy. No evidence of inflammation allowing for motion. Spleen: Normal in size without focal abnormality. Adrenals/Urinary Tract: No adrenal nodule. No hydronephrosis. Nonobstructing stones in the mid right kidney. No evidence of ureteral stone. Foley catheter decompresses the urinary bladder. There are layering stones in the bladder. Mild diffuse bladder wall thickening. Right renal cyst. No further follow-up imaging is recommended. Stomach/Bowel: The stomach is nondistended. Duodenal diverticula as before. No bowel obstruction. There is no obvious bowel inflammation however motion limits assessment multiple bowel loops. Normal appendix is visualized. Small volume of stool in the colon. Vascular/Lymphatic: Normal caliber abdominal aorta, mild atherosclerosis. Retroaortic left renal vein. Coils in the central small bowel mesentery. Portal vein appears to be patent. No suspicious lymphadenopathy. Reproductive: Unremarkable prostate. Other: No ascites or free air.  Prior umbilical hernia repair. Musculoskeletal: No acute osseous finding allowing for motion artifact limitations IMPRESSION: 1. Cholecystostomy tube appears to be coiled in the gallbladder fundus. The gallbladder is decompressed. Small gallstones which are not well assessed due to motion. 2. Mild central intrahepatic biliary ductal dilatation. The common bile duct is poorly assessed on this motion degraded exam, measures approximately 9 mm. No obvious choledocholithiasis. Recommend correlation with LFTs. Ultrasound could be considered as clinically indicated. MRCP is not recommended for this patient given motion on CT 3. Nonobstructing right renal stones. Layering stones in the bladder. Foley catheter decompresses the urinary bladder. Mild diffuse bladder wall thickening, recommend correlation with urinalysis. Aortic Atherosclerosis (ICD10-I70.0). Electronically Signed   By: Narda Rutherford M.D.   On: 01/17/2023 19:14    Labs:  CBC: Recent  Labs    01/18/23 0405 01/19/23 0402 01/20/23 0413 01/21/23 0408  WBC 8.5 5.2 6.5 5.5  HGB 11.2* 11.9* 11.8* 11.7*  HCT 33.0* 34.5* 34.5* 34.4*  PLT 117* 119* 120* 142*    COAGS: Recent Labs    03/29/22 1618 01/19/23 0932  INR 1.1 1.0  APTT 24  --     BMP: Recent Labs    01/18/23 0405 01/19/23 0402 01/20/23 0413 01/21/23 0408  NA 136 138 137 136  K 2.9* 3.9 3.5 2.8*  CL 102 105 106 106  CO2 25 26 23 23   GLUCOSE 127* 139* 125* 148*  BUN 10 8 9 8   CALCIUM 8.1* 8.6* 8.6* 8.5*  CREATININE 0.78 0.62 0.62 0.55*  GFRNONAA >60 >60 >60 >60    LIVER FUNCTION TESTS: Recent Labs    01/18/23 0405 01/19/23 0402 01/20/23 0413 01/21/23 0408  BILITOT 1.8* 2.9* 1.4* 0.7  AST 177* 67* 30 18  ALT 288* 190* 120* 88*  ALKPHOS 230* 229* 196* 160*  PROT 5.8* 6.0* 5.7* 5.8*  ALBUMIN 2.7* 2.7* 2.6* 2.6*    Assessment and Plan:  Choledocholithiasis with cholecystostomy now s/p biliary angioplasty and internal/external biliary drainage catheter placed via cholecystostomy access 01/19/23 with Dr. Milford Cage   Patient scheduled for discharge home today. Bilirubin levels have returned to normal and the drain was capped for a capping trial. IR follow up plans discussed with the patient and his wife. Patient will follow up with Dr. Milford Cage in approximately 4 weeks. The drain will be flushed once daily with 5 ml normal saline. The patient was given a gravity bag and he will reconnect the drain to the bag if he develops abdominal pain.   The patient and his wife know they can call IR with any questions/concerns prior to their follow up visit. A scheduler will call them to  arrange the appointment.   Electronically Signed: Alwyn Ren, AGACNP-BC 01/21/2023, 11:31 AM   I spent a total of 15 Minutes at the the patient's bedside AND on the patient's hospital floor or unit, greater than 50% of which was counseling/coordinating care for choledocholithiasis.

## 2023-01-21 NOTE — Progress Notes (Addendum)
Pt has DC order. Wife is aware of the DC order. AVS was given and explained to wife, all questions were answered. Pt will be DC after 3 runs of Potassium. Dressing of Biliary tube was done, dressing supplies was provided to wife. Pt to DC with foley., CHG done. PTAR has been called in by CM.

## 2023-01-21 NOTE — Care Management (Signed)
PTAR called Nursing aware

## 2023-01-22 ENCOUNTER — Telehealth: Payer: Self-pay

## 2023-01-22 ENCOUNTER — Telehealth: Payer: Self-pay | Admitting: *Deleted

## 2023-01-22 DIAGNOSIS — E119 Type 2 diabetes mellitus without complications: Secondary | ICD-10-CM

## 2023-01-22 DIAGNOSIS — Z794 Long term (current) use of insulin: Secondary | ICD-10-CM | POA: Diagnosis not present

## 2023-01-22 DIAGNOSIS — Z6828 Body mass index (BMI) 28.0-28.9, adult: Secondary | ICD-10-CM | POA: Diagnosis not present

## 2023-01-22 DIAGNOSIS — J9611 Chronic respiratory failure with hypoxia: Secondary | ICD-10-CM

## 2023-01-22 DIAGNOSIS — N390 Urinary tract infection, site not specified: Secondary | ICD-10-CM | POA: Diagnosis not present

## 2023-01-22 DIAGNOSIS — E785 Hyperlipidemia, unspecified: Secondary | ICD-10-CM | POA: Diagnosis not present

## 2023-01-22 DIAGNOSIS — N3281 Overactive bladder: Secondary | ICD-10-CM | POA: Diagnosis not present

## 2023-01-22 DIAGNOSIS — I639 Cerebral infarction, unspecified: Secondary | ICD-10-CM

## 2023-01-22 DIAGNOSIS — Z9181 History of falling: Secondary | ICD-10-CM | POA: Diagnosis not present

## 2023-01-22 DIAGNOSIS — N200 Calculus of kidney: Secondary | ICD-10-CM | POA: Diagnosis not present

## 2023-01-22 DIAGNOSIS — Z466 Encounter for fitting and adjustment of urinary device: Secondary | ICD-10-CM | POA: Diagnosis not present

## 2023-01-22 DIAGNOSIS — G35 Multiple sclerosis: Secondary | ICD-10-CM | POA: Diagnosis not present

## 2023-01-22 DIAGNOSIS — Z436 Encounter for attention to other artificial openings of urinary tract: Secondary | ICD-10-CM | POA: Diagnosis not present

## 2023-01-22 DIAGNOSIS — I471 Supraventricular tachycardia, unspecified: Secondary | ICD-10-CM | POA: Diagnosis not present

## 2023-01-22 DIAGNOSIS — Z8673 Personal history of transient ischemic attack (TIA), and cerebral infarction without residual deficits: Secondary | ICD-10-CM | POA: Diagnosis not present

## 2023-01-22 DIAGNOSIS — D649 Anemia, unspecified: Secondary | ICD-10-CM | POA: Diagnosis not present

## 2023-01-22 DIAGNOSIS — I10 Essential (primary) hypertension: Secondary | ICD-10-CM | POA: Diagnosis not present

## 2023-01-22 DIAGNOSIS — R339 Retention of urine, unspecified: Secondary | ICD-10-CM | POA: Diagnosis not present

## 2023-01-22 DIAGNOSIS — E876 Hypokalemia: Secondary | ICD-10-CM | POA: Diagnosis not present

## 2023-01-22 LAB — CULTURE, BLOOD (ROUTINE X 2)
Culture: NO GROWTH
Culture: NO GROWTH
Special Requests: ADEQUATE

## 2023-01-22 NOTE — Transitions of Care (Post Inpatient/ED Visit) (Signed)
01/22/2023  Name: Troy Randall MRN: [id number] DOB: October 02, 1960  Today's TOC FU Call Status: Today's TOC FU Call Status:: Successful TOC FU Call Completed TOC FU Call Complete Date: 01/22/23 Patient's Name and Date of Birth confirmed.  Transition Care Management Follow-up Telephone Call Date of Discharge: 01/21/23 Discharge Facility: [facility]) Type of Discharge: Inpatient Admission Primary Inpatient Discharge Diagnosis:: SepsisBiliary drain displacement How have you been since you were released from the hospital?: Better Any questions or concerns?: No  Items Reviewed: Did you receive and understand the discharge instructions provided?: Yes Medications obtained,verified, and reconciled?: Yes (Medications Reviewed) Any new allergies since your discharge?: No Dietary orders reviewed?: Yes Type of Diet Ordered:: Reg Heart Healthy Do you have support at home?: Yes People in Home: spouse Name of Support/Comfort Primary Source: [name]  Medications Reviewed Today: Medications Reviewed Today     Reviewed by Johnnette Barrios, RN (Registered Nurse) on 01/22/23 at 1013  Med List Status: <None>   Medication Order Taking? Sig Documenting Provider Last Dose Status Informant  Ascorbic Acid (VITAMIN C) 500 MG CHEW [id number] Yes Chew 1 tablet by mouth daily. [provider] Taking Active Spouse/Significant Other, Pharmacy Records  BD VEO INSULIN SYRINGE U/F 31G X 15/64" 1 ML MISC [id number] Yes  USE AS DIRECTED Aliene Beams, MD Taking Active Spouse/Significant Other, Pharmacy Records  cefadroxil (DURICEF) 500 MG capsule [id number] Yes Take 1 capsule (500 mg total) by mouth 2 (two) times daily for 3 days. Willeen Niece, MD Taking Active   diphenhydrAMINE (BENADRYL ALLERGY) 25 MG tablet [id number] Yes Take 25 mg by mouth every 6 (six) hours as needed for allergies. [provider] Taking Active Spouse/Significant Other, Pharmacy Records  glucose blood (ONETOUCH VERIO)  test strip [id number] Yes TEST twice a day Aliene Beams, MD Taking Active Spouse/Significant Other, Pharmacy Records  Insulin Pen Needle (NOVOTWIST) 32G X 5 MM MISC [id number] Yes Use two daily to inject Victoza and Toujeo. Reather Littler, MD Taking Active Spouse/Significant Other, Pharmacy Records  metoprolol succinate (TOPROL-XL) 25 MG 24 hr tablet [id number] Yes Take 0.5 tablets (12.5 mg total) by mouth daily. Rhetta Mura, MD Taking Active Spouse/Significant Other, Pharmacy Records  metroNIDAZOLE (FLAGYL) 500 MG tablet [id number] Yes Take 1 tablet (500 mg total) by mouth 3 (three) times daily for 3 days. Willeen Niece, MD Taking Active   [facility] LANCETS 33G [place] [id number] Yes Use to check blood sugar once a day dx code E11.65 Reather Littler, MD Taking Active Spouse/Significant Other, Pharmacy Records  pantoprazole (PROTONIX) 40 MG tablet [id number] Yes Take 1 tablet (40 mg total) by mouth daily.  Patient taking differently: Take 40 mg by mouth daily before breakfast.   Valetta Fuller, Lynnell Jude, PA-C Taking Active Spouse/Significant Other, Pharmacy Records  potassium chloride (KLOR-CON) 10 MEQ tablet [id number] No Take 10 mEq by mouth daily.  Patient not taking: Reported on 01/17/2023   [provider] Not Taking Active Spouse/Significant Other, Pharmacy Records  Probiotic Product (PROBIOTIC PO) [id number] Yes Take 1 tablet by mouth daily. [provider] Taking Active Spouse/Significant Other, Pharmacy Records  rosuvastatin (CRESTOR) 40 MG tablet [id number] Yes Take 1 tablet (40 mg total) by mouth daily. Azucena Fallen, MD Taking Active Spouse/Significant Other, Pharmacy Records  traZODone (DESYREL) 50 MG tablet [id number] Yes Take 0.5-1 tablets (25-50 mg total) by mouth at bedtime as needed for sleep.  Patient taking differently: Take 50 mg by mouth at bedtime.   Setzer, Lynnell Jude, PA-C Taking Active Spouse/Significant Other, Pharmacy Records  TRESIBA FLEXTOUCH 200 UNIT/ML  FlexTouch Pen [id number] Yes Inject under skin 15 units twice a day.  Patient taking differently: Inject 18 Units into the skin See admin instructions. Inject 18 units into the skin daily- BGL must be 130 or greater   Setzer, Lynnell Jude, PA-C Taking Active Spouse/Significant Other, Pharmacy Records           Med Note (Maryem Shuffler L   Fri Jan 22, 2023 10:13 AM) Per spouse 18 units   TYLENOL 500 MG tablet [id number] Yes Take 1 tablet (500 mg total) by mouth every 6 (six) hours as needed for mild pain or headache. Rhetta Mura, MD Taking Active Spouse/Significant Other, Pharmacy Records           Medication reconciliation / review completed based on most recent discharge summary and EHR medication list. Confirmed patient is taking all newly prescribed medications as instructed (any discrepancies are noted in review section)   Patient / Caregiver is aware of any changes to and / or  any dosage adjustments to medication regimen. Patient/ Caregiver denies questions at this time and reports no barriers to medication adherence.   Home Care and Equipment/Supplies: Were Home Health Services Ordered?: Yes Name of Home Health Agency:: [facility]  [phone] Has Agency set up a time to come to your home?: Yes First Home Health Visit Date: 01/23/23 (He was previously on caseload and iswell known to [facility]) Any new equipment or medical supplies ordered?: No  Functional Questionnaire: Do you need assistance with bathing/showering or dressing?: Yes Do you need assistance with meal preparation?: Yes Do you need assistance with eating?: No Do you have difficulty maintaining continence: Yes (Indwelling Catheter,, wears pull ups) Do you need assistance with getting out of bed/getting out of a chair/moving?: Yes Do you have difficulty managing or taking your medications?: Yes (Spouse manages)  Follow up appointments reviewed: PCP Follow-up appointment confirmed?: No (Wife is calling today to  schedue) MD Provider Line [phone] Given: No Specialist Hospital Follow-up appointment confirmed?: Yes Date of Specialist follow-up appointment?: 02/09/23 Follow-Up Specialty Provider:: Urology 2/4, Surgical ( 1/23) 1 Month Follow-up TBS by office Do you need transportation to your follow-up appointment?: No (Spouse transports) Do you understand care options if your condition(s) worsen?: Yes-patient verbalized understanding  SDOH Interventions Today    Flowsheet Row Most Recent Value  SDOH Interventions   Food Insecurity Interventions Intervention Not Indicated  Housing Interventions Intervention Not Indicated  Transportation Interventions Intervention Not Indicated, Patient Resources (Friends/Family)  Utilities Interventions Intervention Not Indicated  Social Connections Interventions Intervention Not Indicated      Interventions Today    Flowsheet Row Most Recent Value  Chronic Disease   Chronic disease during today's visit Other  [Sepsis UTI]  General Interventions   General Interventions Discussed/Reviewed General Interventions Discussed, General Interventions Reviewed, Doctor Visits  Doctor Visits Discussed/Reviewed PCP, Doctor Visits Reviewed, Specialist, Doctor Visits Discussed  PCP/Specialist Visits Compliance with follow-up visit  Exercise Interventions   Exercise Discussed/Reviewed Physical Activity  Physical Activity Discussed/Reviewed Physical Activity Reviewed  Mental Health Interventions   Mental Health Discussed/Reviewed Coping Strategies  Nutrition Interventions   Nutrition Discussed/Reviewed Nutrition Reviewed, Fluid intake, Supplemental nutrition  Pharmacy Interventions   Pharmacy Dicussed/Reviewed Medications and their functions  Safety Interventions   Safety Discussed/Reviewed Safety Reviewed        Patient is at high risk for readmission and/or has history of  high utilization  Discussed VBCI  TOC program and weekly calls to patient to  assess condition/status, medication  management  and provide support/education as indicated . Patient/ Caregiver voiced understanding and declined enrollment in the 30-day [facility] Program.  Spoke with spouse who is primary caregiver. He has been followed by and is  well known to [facility] . Spouse reports they manage catheter care, blood draws and regular visits She feels she is well versed in his care and [facility] knows him well will defer to Home health Nursing to avoid duplication in services  Ambulatory referral made for SW to assist with Medicaid application process per spouse [facility] had not had SW available     The patient has been provided with contact information for the care management team and has been advised to call with any health related questions or concerns.    Susa Loffler , BSN, RN [facility]   [facility] RN Care Manager Direct Dial [phone] Fax [phone] Website: [email or web address]

## 2023-01-22 NOTE — Progress Notes (Unsigned)
Complex Care Management Note Care Guide Note  01/22/2023 Name: Troy Randall MRN: [id number] DOB: 16-Sep-1960   Complex Care Management Outreach Attempts: An unsuccessful telephone outreach was attempted today to offer the patient information about available complex care management services.  Follow Up Plan:  Additional outreach attempts will be made to offer the patient complex care management information and services.   Encounter Outcome:  No Answer  Gwenevere Ghazi  [facility]  [facility] Guide  Direct Dial: [phone]  Fax [phone]

## 2023-01-25 NOTE — Progress Notes (Signed)
Complex Care Management Note  Care Guide Note 01/25/2023 Name: DESMUND KUPER MRN: [id number] DOB: Jan 12, 1960  [name] is a 63 y.o. year old male who sees Margo Aye, Kathleene Hazel, MD for primary care. I reached out to [name] by phone today to offer complex care management services.  Mr. Kase spouse Lonnell Bea DPR on file was given information about Complex Care Management services today including:   The Complex Care Management services include support from the care team which includes your Nurse Coordinator, Clinical Social Worker, or Pharmacist.  The Complex Care Management team is here to help remove barriers to the health concerns and goals most important to you. Complex Care Management services are voluntary, and the patient may decline or stop services at any time by request to their care team member.   Complex Care Management Consent Status: Patient spouse Ron Haab DPR on file agreed to services and verbal consent obtained.   Follow up plan:  Telephone appointment with complex care management team member scheduled for:  1/30  Encounter Outcome:  Patient Scheduled  Gwenevere Ghazi  [facility]  [facility], [facility] Guide  Direct Dial: [phone]  Fax [phone]

## 2023-01-25 NOTE — Progress Notes (Signed)
Complex Care Management Note Care Guide Note  01/25/2023 Name: Troy Randall MRN: [id number] DOB: Aug 27, 1960   Complex Care Management Outreach Attempts: A second unsuccessful outreach was attempted today to offer the patient with information about available complex care management services.  Follow Up Plan:  Additional outreach attempts will be made to offer the patient complex care management information and services.   Encounter Outcome:  No Answer  Gwenevere Ghazi  [facility]  [facility] Guide  Direct Dial: [phone]  Fax [phone]

## 2023-01-28 DIAGNOSIS — N200 Calculus of kidney: Secondary | ICD-10-CM | POA: Diagnosis not present

## 2023-01-28 DIAGNOSIS — Z436 Encounter for attention to other artificial openings of urinary tract: Secondary | ICD-10-CM | POA: Diagnosis not present

## 2023-01-28 DIAGNOSIS — Z794 Long term (current) use of insulin: Secondary | ICD-10-CM | POA: Diagnosis not present

## 2023-01-28 DIAGNOSIS — Z466 Encounter for fitting and adjustment of urinary device: Secondary | ICD-10-CM | POA: Diagnosis not present

## 2023-01-28 DIAGNOSIS — D649 Anemia, unspecified: Secondary | ICD-10-CM | POA: Diagnosis not present

## 2023-01-28 DIAGNOSIS — I471 Supraventricular tachycardia, unspecified: Secondary | ICD-10-CM | POA: Diagnosis not present

## 2023-01-28 DIAGNOSIS — R339 Retention of urine, unspecified: Secondary | ICD-10-CM | POA: Diagnosis not present

## 2023-01-28 DIAGNOSIS — Z6828 Body mass index (BMI) 28.0-28.9, adult: Secondary | ICD-10-CM | POA: Diagnosis not present

## 2023-01-28 DIAGNOSIS — E785 Hyperlipidemia, unspecified: Secondary | ICD-10-CM | POA: Diagnosis not present

## 2023-01-28 DIAGNOSIS — E876 Hypokalemia: Secondary | ICD-10-CM | POA: Diagnosis not present

## 2023-01-28 DIAGNOSIS — E119 Type 2 diabetes mellitus without complications: Secondary | ICD-10-CM | POA: Diagnosis not present

## 2023-01-28 DIAGNOSIS — Z8673 Personal history of transient ischemic attack (TIA), and cerebral infarction without residual deficits: Secondary | ICD-10-CM | POA: Diagnosis not present

## 2023-01-28 DIAGNOSIS — I10 Essential (primary) hypertension: Secondary | ICD-10-CM | POA: Diagnosis not present

## 2023-01-28 DIAGNOSIS — N390 Urinary tract infection, site not specified: Secondary | ICD-10-CM | POA: Diagnosis not present

## 2023-01-28 DIAGNOSIS — G35 Multiple sclerosis: Secondary | ICD-10-CM | POA: Diagnosis not present

## 2023-01-28 DIAGNOSIS — Z9181 History of falling: Secondary | ICD-10-CM | POA: Diagnosis not present

## 2023-01-28 DIAGNOSIS — N3281 Overactive bladder: Secondary | ICD-10-CM | POA: Diagnosis not present

## 2023-01-29 DIAGNOSIS — I10 Essential (primary) hypertension: Secondary | ICD-10-CM | POA: Diagnosis not present

## 2023-01-29 DIAGNOSIS — R339 Retention of urine, unspecified: Secondary | ICD-10-CM | POA: Diagnosis not present

## 2023-01-29 DIAGNOSIS — N200 Calculus of kidney: Secondary | ICD-10-CM | POA: Diagnosis not present

## 2023-01-29 DIAGNOSIS — E876 Hypokalemia: Secondary | ICD-10-CM | POA: Diagnosis not present

## 2023-01-29 DIAGNOSIS — Z9181 History of falling: Secondary | ICD-10-CM | POA: Diagnosis not present

## 2023-01-29 DIAGNOSIS — Z466 Encounter for fitting and adjustment of urinary device: Secondary | ICD-10-CM | POA: Diagnosis not present

## 2023-01-29 DIAGNOSIS — E785 Hyperlipidemia, unspecified: Secondary | ICD-10-CM | POA: Diagnosis not present

## 2023-01-29 DIAGNOSIS — D649 Anemia, unspecified: Secondary | ICD-10-CM | POA: Diagnosis not present

## 2023-01-29 DIAGNOSIS — G35 Multiple sclerosis: Secondary | ICD-10-CM | POA: Diagnosis not present

## 2023-01-29 DIAGNOSIS — E119 Type 2 diabetes mellitus without complications: Secondary | ICD-10-CM | POA: Diagnosis not present

## 2023-01-29 DIAGNOSIS — Z436 Encounter for attention to other artificial openings of urinary tract: Secondary | ICD-10-CM | POA: Diagnosis not present

## 2023-01-29 DIAGNOSIS — N3281 Overactive bladder: Secondary | ICD-10-CM | POA: Diagnosis not present

## 2023-01-29 DIAGNOSIS — I471 Supraventricular tachycardia, unspecified: Secondary | ICD-10-CM | POA: Diagnosis not present

## 2023-01-29 DIAGNOSIS — Z794 Long term (current) use of insulin: Secondary | ICD-10-CM | POA: Diagnosis not present

## 2023-01-29 DIAGNOSIS — Z6828 Body mass index (BMI) 28.0-28.9, adult: Secondary | ICD-10-CM | POA: Diagnosis not present

## 2023-01-29 DIAGNOSIS — N390 Urinary tract infection, site not specified: Secondary | ICD-10-CM | POA: Diagnosis not present

## 2023-01-29 DIAGNOSIS — Z8673 Personal history of transient ischemic attack (TIA), and cerebral infarction without residual deficits: Secondary | ICD-10-CM | POA: Diagnosis not present

## 2023-02-01 DIAGNOSIS — E876 Hypokalemia: Secondary | ICD-10-CM | POA: Diagnosis not present

## 2023-02-01 DIAGNOSIS — I10 Essential (primary) hypertension: Secondary | ICD-10-CM | POA: Diagnosis not present

## 2023-02-01 DIAGNOSIS — G0400 Acute disseminated encephalitis and encephalomyelitis, unspecified: Secondary | ICD-10-CM | POA: Diagnosis not present

## 2023-02-01 DIAGNOSIS — K819 Cholecystitis, unspecified: Secondary | ICD-10-CM | POA: Diagnosis not present

## 2023-02-01 DIAGNOSIS — Z8673 Personal history of transient ischemic attack (TIA), and cerebral infarction without residual deficits: Secondary | ICD-10-CM | POA: Diagnosis not present

## 2023-02-01 DIAGNOSIS — K922 Gastrointestinal hemorrhage, unspecified: Secondary | ICD-10-CM | POA: Diagnosis not present

## 2023-02-01 DIAGNOSIS — A419 Sepsis, unspecified organism: Secondary | ICD-10-CM | POA: Diagnosis not present

## 2023-02-01 DIAGNOSIS — E1169 Type 2 diabetes mellitus with other specified complication: Secondary | ICD-10-CM | POA: Diagnosis not present

## 2023-02-01 DIAGNOSIS — N200 Calculus of kidney: Secondary | ICD-10-CM | POA: Diagnosis not present

## 2023-02-01 DIAGNOSIS — Z960 Presence of urogenital implants: Secondary | ICD-10-CM | POA: Diagnosis not present

## 2023-02-01 DIAGNOSIS — K529 Noninfective gastroenteritis and colitis, unspecified: Secondary | ICD-10-CM | POA: Diagnosis not present

## 2023-02-01 DIAGNOSIS — E861 Hypovolemia: Secondary | ICD-10-CM | POA: Diagnosis not present

## 2023-02-02 NOTE — Progress Notes (Signed)
 History of Present Illness: 63 year old male here for follow-up.  Initially seen by me in December 2023.  The patient had acute urinary retention.  He was admitted to the hospital prior to that appointment with a GI bleed, encephalitis and examined his cholecystitis.  When I saw him, he was still significantly debilitated.  It was felt best to leave him with a catheter at least in the short-term  He underwent ureteroscopic management of a left UVJ stone by Dr. Medford Han on 03/30/2022.  His stent was subsequently removed by me at his last visit here in April.  8.6.2024: For recheck.  Has his catheter changed every month.  His biliary tube is changed every couple of months.  He has not been treated for recent urinary tract infection.  He does have his urine sent off once in a while for culture at the behest of his home health care nurse.  No improvement in neurologic situation.  2.4.2025:  I connected with  Adine LITTIE Ada on 02/09/23 by a video enabled telemedicine application and verified that I am speaking with the correct person using two identifiers.   I discussed the limitations of evaluation and management by telemedicine. The patient expressed understanding and agreed to proceed.  I spoke with the patient's wife, Jon.  He recently was discharged from the hospital for treatment of choledocholithiasis with sepsis.  He was sent home on Duricef as well as metronidazole .  He gets his catheter changed every 4 weeks.  He does get uncomfortable at times from UTIs, last UTI was apparently about 3 weeks ago.  Overall medical condition about the same.  Past Medical History:  Diagnosis Date   Anemia    Arthritis    CVA (cerebral vascular accident)    last CVA 04/2021   Diabetes mellitus without complication    type 2   Hyperlipidemia    Hypertension    Kidney stones     Past Surgical History:  Procedure Laterality Date   ANKLE SURGERY     BIOPSY  09/10/2021   Procedure: BIOPSY;   Surgeon: Rollin Dover, MD;  Location: [facility] ENDOSCOPY;  Service: Gastroenterology;;   BIOPSY  10/07/2021   Procedure: BIOPSY;  Surgeon: Rollin Dover, MD;  Location: [facility] ENDOSCOPY;  Service: Gastroenterology;;   COLONOSCOPY N/A 10/07/2021   Procedure: COLONOSCOPY;  Surgeon: Rollin Dover, MD;  Location: [facility] ENDOSCOPY;  Service: Gastroenterology;  Laterality: N/A;   COLONOSCOPY WITH PROPOFOL  N/A 09/06/2021   Procedure: COLONOSCOPY WITH PROPOFOL ;  Surgeon: Eartha Angelia Sieving, MD;  Location: [facility] ENDO SUITE;  Service: Gastroenterology;  Laterality: N/A;   CYSTOSCOPY/URETEROSCOPY/HOLMIUM LASER/STENT PLACEMENT Left 03/30/2022   Procedure: CYSTOSCOPY/RETROGRADE/URETEROSCOPY/HOLMIUM LASER/STENT PLACEMENT;  Surgeon: Han Lonni Righter, MD;  Location: [facility] ORS;  Service: Urology;  Laterality: Left;   ENTEROSCOPY N/A 09/10/2021   Procedure: ENTEROSCOPY;  Surgeon: Rollin Dover, MD;  Location: [facility] ENDOSCOPY;  Service: Gastroenterology;  Laterality: N/A;   ENTEROSCOPY  09/06/2021   Procedure: ENTEROSCOPY;  Surgeon: Eartha Angelia Sieving, MD;  Location: [facility] ENDO SUITE;  Service: Gastroenterology;;   ESOPHAGOGASTRODUODENOSCOPY (EGD) WITH PROPOFOL   09/06/2021   Procedure: ESOPHAGOGASTRODUODENOSCOPY (EGD) WITH PROPOFOL ;  Surgeon: Eartha Angelia Sieving, MD;  Location: [facility] END[facility] SU[facility];  Service: Gastroenterology;;   ENID SIGMOIDOSCOPY N/A 09/19/2021   Procedure: ENID MORIN;  Surgeon: Rollin Dover, MD;  Location: [facility] ENDOSCOPY;  Service: Gastroenterology;  Laterality: N/A;   FOREIGN BODY REMOVAL  09/19/2021   Procedure: FOREIGN BODY REMOVAL;  Surgeon: Rollin Dover, MD;  Location: [facility] ENDOSCOPY;  Service: Gastroenterology;;  GIVENS CAPSULE STUDY  09/06/2021   Procedure: GIVENS CAPSULE STUDY;  Surgeon: Eartha Angelia Sieving, MD;  Location: [facility] ENDO SU[facility];  Service: Gastroenterology;;   HERNIA REPAIR     umbilical x1 Incisional x1   INCISIONAL HERNIA REPAIR  04/13/2011   Procedure: LAPAROSCOPIC  INCISIONAL HERNIA;  Surgeon: Oneil DELENA Budge, MD;  Location: [facility] ORS;  Service: General;  Laterality: N/A;  Recurrent Laparoscopic Incisional Herniorraphy with Mesh   IR ANGIOGRAM SELECTIVE EACH ADDITIONAL VESSEL  09/09/2021   IR ANGIOGRAM VISCERAL SELECTIVE  09/07/2021   IR ANGIOGRAM VISCERAL SELECTIVE  09/06/2021   IR ANGIOGRAM VISCERAL SELECTIVE  09/06/2021   IR CHOLANGIOGRAM EXISTING TUBE  11/06/2021   IR EMBO ART  VEN HEMORR LYMPH EXTRAV  INC GUIDE ROADMAPPING  09/06/2021   IR EXCHANGE BILIARY DRAIN  10/24/2021   IR EXCHANGE BILIARY DRAIN  11/19/2021   IR EXCHANGE BILIARY DRAIN  01/19/2022   IR EXCHANGE BILIARY DRAIN  02/19/2022   IR EXCHANGE BILIARY DRAIN  04/01/2022   IR GUIDED DRAIN W CATHETER PLACEMENT  09/07/2021   IR US  GUIDE BX ASP/DRAIN  09/07/2021   IR US  GUIDE VASC ACCESS RIGHT  09/07/2021   IR US  GUIDE VASC ACCESS RIGHT  09/06/2021   KIDNEY STONE SURGERY     RADIOLOGY WITH ANESTHESIA N/A 03/10/2022   Procedure: MRI WITH ANESTHESIA OF BRAIN WITH AND WITHOUT CONTRAST;  Surgeon: Radiologist, Medication, MD;  Location: [facility] OR;  Service: Radiology;  Laterality: N/A;    Home Medications:  Allergies as of 04/21/2022       Reactions   Bactrim  [sulfamethoxazole -trimethoprim ] Rash   Amoxicillin  Hives   Tolerated Cefepime         Medication List        Accurate as of April 21, 2022  8:59 AM. If you have any questions, ask your nurse or doctor.          BD Veo Insulin  Syringe U/F 31G X 15/64 1 ML Misc Generic drug: Insulin  Syringe-Needle U-100 USE AS DIRECTED   glucose blood test strip Commonly known as: OneTouch Verio TEST twice a day   Insulin  Pen Needle 32G X 5 MM Misc Commonly known as: NovoTwist Use two daily to inject Victoza  and Toujeo .   methocarbamol  500 MG tablet Commonly known as: ROBAXIN  Take 1 tablet (500 mg total) by mouth every 6 (six) hours as needed for muscle spasms.   metoprolol  succinate 25 MG 24 hr tablet Commonly known as: TOPROL -XL Take 0.5 tablets (12.5 mg  total) by mouth daily.   OneTouch Delica Lancets 33G Misc Use to check blood sugar once a day dx code E11.65   pantoprazole  40 MG tablet Commonly known as: PROTONIX  Take 1 tablet (40 mg total) by mouth daily. What changed: when to take this   potassium chloride  SA 20 MEQ tablet Commonly known as: KLOR-CON  M Take 2 tablets (40 mEq total) by mouth 2 (two) times daily.   PROBIOTIC PO Take 8.5 mg by mouth daily.   rosuvastatin  40 MG tablet Commonly known as: Crestor  Take 1 tablet (40 mg total) by mouth daily.   traZODone  50 MG tablet Commonly known as: DESYREL  Take 0.5-1 tablets (25-50 mg total) by mouth at bedtime as needed for sleep. What changed:  how much to take when to take this   Tresiba  FlexTouch 200 UNIT/ML FlexTouch Pen Generic drug: insulin  degludec Inject under skin 15 units twice a day. What changed:  how much to take how to take this when to take this additional  instructions   TYLENOL  500 MG tablet Generic drug: acetaminophen  Take 1 tablet (500 mg total) by mouth every 6 (six) hours as needed for mild pain or headache.        Allergies:  Allergies  Allergen Reactions   Bactrim  [Sulfamethoxazole -Trimethoprim ] Rash   Amoxicillin  Hives    Tolerated Cefepime     Family History  Problem Relation Age of Onset   Heart failure Mother    Diabetes Father    Cancer Other    Heart attack Other    Anesthesia problems Neg Hx    Hypotension Neg Hx    Malignant hyperthermia Neg Hx    Pseudochol deficiency Neg Hx    Colon cancer Neg Hx    Inflammatory bowel disease Neg Hx     Social History:  reports that he has never smoked. He has quit using smokeless tobacco.  His smokeless tobacco use included snuff. He reports that he does not drink alcohol and does not use drugs.  ROS: A complete review of systems was performed.  All systems are negative except for pertinent findings as noted.  Physical Exam:  Vital signs in last 24 hours: There were no vitals  taken for this visit.   I have reviewed prior pt notes  I have reviewed notes from recent hospitalization  I have independently reviewed prior imaging--recent CT images were reviewed.  He does have mesh repair of hernias.  It does not look like the mesh goes down to his lower abdomen.  I have reviewed prior urine culture   Impression/Assessment:  Incomplete bladder emptying, indwelling catheter present.  He does have history of catastrophic neurologic event.  He is still not very functional.  History of ureteral stone, status post ureteroscopic management of left distal stone and March.     Plan:  I discussed possible suprapubic tube placement with the patient's wife.  More than likely he will need a long-term bladder decompression and this might be an easier way of managing his bladder drainage  She will think about this.  If they want to proceed we can consider sending him to interventional radiology to do this.  They are familiar with the patient  We will arrange telehealth visit in 3 months

## 2023-02-03 DIAGNOSIS — G35 Multiple sclerosis: Secondary | ICD-10-CM | POA: Diagnosis not present

## 2023-02-03 DIAGNOSIS — E785 Hyperlipidemia, unspecified: Secondary | ICD-10-CM | POA: Diagnosis not present

## 2023-02-03 DIAGNOSIS — Z436 Encounter for attention to other artificial openings of urinary tract: Secondary | ICD-10-CM | POA: Diagnosis not present

## 2023-02-03 DIAGNOSIS — E876 Hypokalemia: Secondary | ICD-10-CM | POA: Diagnosis not present

## 2023-02-03 DIAGNOSIS — E119 Type 2 diabetes mellitus without complications: Secondary | ICD-10-CM | POA: Diagnosis not present

## 2023-02-03 DIAGNOSIS — R339 Retention of urine, unspecified: Secondary | ICD-10-CM | POA: Diagnosis not present

## 2023-02-03 DIAGNOSIS — Z9181 History of falling: Secondary | ICD-10-CM | POA: Diagnosis not present

## 2023-02-03 DIAGNOSIS — Z6828 Body mass index (BMI) 28.0-28.9, adult: Secondary | ICD-10-CM | POA: Diagnosis not present

## 2023-02-03 DIAGNOSIS — Z466 Encounter for fitting and adjustment of urinary device: Secondary | ICD-10-CM | POA: Diagnosis not present

## 2023-02-03 DIAGNOSIS — N3281 Overactive bladder: Secondary | ICD-10-CM | POA: Diagnosis not present

## 2023-02-03 DIAGNOSIS — Z794 Long term (current) use of insulin: Secondary | ICD-10-CM | POA: Diagnosis not present

## 2023-02-03 DIAGNOSIS — I471 Supraventricular tachycardia, unspecified: Secondary | ICD-10-CM | POA: Diagnosis not present

## 2023-02-03 DIAGNOSIS — N200 Calculus of kidney: Secondary | ICD-10-CM | POA: Diagnosis not present

## 2023-02-03 DIAGNOSIS — D649 Anemia, unspecified: Secondary | ICD-10-CM | POA: Diagnosis not present

## 2023-02-03 DIAGNOSIS — Z8673 Personal history of transient ischemic attack (TIA), and cerebral infarction without residual deficits: Secondary | ICD-10-CM | POA: Diagnosis not present

## 2023-02-03 DIAGNOSIS — I10 Essential (primary) hypertension: Secondary | ICD-10-CM | POA: Diagnosis not present

## 2023-02-03 DIAGNOSIS — N390 Urinary tract infection, site not specified: Secondary | ICD-10-CM | POA: Diagnosis not present

## 2023-02-04 ENCOUNTER — Telehealth: Payer: Self-pay | Admitting: Pharmacy Technician

## 2023-02-04 ENCOUNTER — Ambulatory Visit: Payer: Self-pay

## 2023-02-04 DIAGNOSIS — Z5986 Financial insecurity: Secondary | ICD-10-CM

## 2023-02-04 NOTE — Patient Outreach (Signed)
  Care Coordination   Initial Visit Note   02/04/2023 Name: Troy Randall MRN: [id number] DOB: Aug 27, 1960  Troy Randall is a 63 y.o. year old male who sees Margo Aye, Kathleene Hazel, MD for primary care. I spoke with  Troy Randall by phone today.  What matters to the patients health and wellness today?  Patient wants information on Medicaid.  SW will email the link to apply online.    Goals Addressed             This Visit's Progress    Medicaid       Interventions Today    Flowsheet Row Most Recent Value  Chronic Disease   Chronic disease during today's visit Diabetes, Hypertension (HTN)  General Interventions   General Interventions Discussed/Reviewed General Interventions Discussed, General Interventions Reviewed, [employer] provides verbal consent to speak to wife.Pt has not applied for medicaid and only receives Medicare/AETNA.]  Education Interventions   Education Provided Provided Education  [SW educated pt wife on [place]. Pt wife agreed to submit online application.]              SDOH assessments and interventions completed:  Yes  SDOH Interventions Today    Flowsheet Row Most Recent Value  SDOH Interventions   Food Insecurity Interventions Intervention Not Indicated  Housing Interventions Intervention Not Indicated  Transportation Interventions Intervention Not Indicated  Utilities Interventions Intervention Not Indicated        Care Coordination Interventions:  Yes, provided   Follow up plan: No further intervention required.   Encounter Outcome:  Patient Visit Completed

## 2023-02-04 NOTE — Progress Notes (Signed)
Pharmacy Medication Assistance Program Note    02/04/2023  Patient ID: Troy Randall, male   DOB: Jul 23, 1960, 63 y.o.   MRN: [id number]     11/12/2022 02/04/2023  Outreach Medication One  Initial Outreach Date (Medication One) 11/12/2022 11/09/2022  Manufacturer Medication One [employer]  [facility] [name] [name]  Dose of Tresiba 200u/ml FlexTouch 200 units/ml  Type of Forensic scientist Assistance  Date Application Sent to Patient 11/11/2022 11/11/2022  Application Items Requested Application;Proof of Income Application;Proof of Income;Other  Date Application Sent to Prescriber 11/11/2022 11/11/2022  Name of Prescriber John Z Kathreen Cornfield  Date Application Received From Provider  12/09/2022  Patient Assistance Determination  Approved  Approval Start Date  01/06/2023  Approval End Date  01/05/2024    Care coordination call placed to [employer] in regard to [name] application. Per IVR system patient is APPROVED 01/06/23-01/05/24. Medication was shipped via [employer] with tracking number [phone] and was delivered today. Subsequent refills will process automatically for the remainder of the year with delivery to the prescriber's office.  [name], CPhT [place]  Office: [phone] Fax: [phone] Email: [email or web address]

## 2023-02-04 NOTE — Patient Instructions (Signed)
Visit Information  Thank you for taking time to visit with me today. Please don't hesitate to contact me if I can be of assistance to you.   Following are the goals we discussed today:  Patient will apply for Medicaid online.  If you are experiencing a Mental Health or Behavioral Health Crisis or need someone to talk to, please call 911  Patient verbalizes understanding of instructions and care plan provided today and agrees to view in MyChart. Active MyChart status and patient understanding of how to access instructions and care plan via MyChart confirmed with patient.     No further follow up required: Patient does not request a follow up visit.  Lysle Morales, BSW [place]  [facility], [facility] Social Worker Direct Dial: [phone]  Fax: [phone] Website: [email or web address]

## 2023-02-09 ENCOUNTER — Ambulatory Visit: Payer: Medicare HMO | Admitting: Urology

## 2023-02-09 ENCOUNTER — Telehealth: Payer: Medicare HMO | Admitting: Urology

## 2023-02-09 DIAGNOSIS — R339 Retention of urine, unspecified: Secondary | ICD-10-CM

## 2023-02-09 DIAGNOSIS — Z87442 Personal history of urinary calculi: Secondary | ICD-10-CM

## 2023-02-09 DIAGNOSIS — N138 Other obstructive and reflux uropathy: Secondary | ICD-10-CM

## 2023-02-11 DIAGNOSIS — N200 Calculus of kidney: Secondary | ICD-10-CM | POA: Diagnosis not present

## 2023-02-11 DIAGNOSIS — D649 Anemia, unspecified: Secondary | ICD-10-CM | POA: Diagnosis not present

## 2023-02-11 DIAGNOSIS — E785 Hyperlipidemia, unspecified: Secondary | ICD-10-CM | POA: Diagnosis not present

## 2023-02-11 DIAGNOSIS — Z466 Encounter for fitting and adjustment of urinary device: Secondary | ICD-10-CM | POA: Diagnosis not present

## 2023-02-11 DIAGNOSIS — Z8673 Personal history of transient ischemic attack (TIA), and cerebral infarction without residual deficits: Secondary | ICD-10-CM | POA: Diagnosis not present

## 2023-02-11 DIAGNOSIS — Z6828 Body mass index (BMI) 28.0-28.9, adult: Secondary | ICD-10-CM | POA: Diagnosis not present

## 2023-02-11 DIAGNOSIS — Z9181 History of falling: Secondary | ICD-10-CM | POA: Diagnosis not present

## 2023-02-11 DIAGNOSIS — I471 Supraventricular tachycardia, unspecified: Secondary | ICD-10-CM | POA: Diagnosis not present

## 2023-02-11 DIAGNOSIS — Z794 Long term (current) use of insulin: Secondary | ICD-10-CM | POA: Diagnosis not present

## 2023-02-11 DIAGNOSIS — G35 Multiple sclerosis: Secondary | ICD-10-CM | POA: Diagnosis not present

## 2023-02-11 DIAGNOSIS — N3281 Overactive bladder: Secondary | ICD-10-CM | POA: Diagnosis not present

## 2023-02-11 DIAGNOSIS — E119 Type 2 diabetes mellitus without complications: Secondary | ICD-10-CM | POA: Diagnosis not present

## 2023-02-11 DIAGNOSIS — I10 Essential (primary) hypertension: Secondary | ICD-10-CM | POA: Diagnosis not present

## 2023-02-11 DIAGNOSIS — E876 Hypokalemia: Secondary | ICD-10-CM | POA: Diagnosis not present

## 2023-02-11 DIAGNOSIS — Z436 Encounter for attention to other artificial openings of urinary tract: Secondary | ICD-10-CM | POA: Diagnosis not present

## 2023-02-11 DIAGNOSIS — R339 Retention of urine, unspecified: Secondary | ICD-10-CM | POA: Diagnosis not present

## 2023-02-11 DIAGNOSIS — N390 Urinary tract infection, site not specified: Secondary | ICD-10-CM | POA: Diagnosis not present

## 2023-02-12 ENCOUNTER — Other Ambulatory Visit (HOSPITAL_COMMUNITY): Payer: Self-pay | Admitting: Student

## 2023-02-12 ENCOUNTER — Ambulatory Visit (HOSPITAL_COMMUNITY)
Admission: RE | Admit: 2023-02-12 | Discharge: 2023-02-12 | Disposition: A | Discharge status: Home / Self Care | Payer: Medicare HMO | Source: Ambulatory Visit | Attending: Student

## 2023-02-12 DIAGNOSIS — K8020 Calculus of gallbladder without cholecystitis without obstruction: Secondary | ICD-10-CM | POA: Insufficient documentation

## 2023-02-12 DIAGNOSIS — T85520D Displacement of bile duct prosthesis, subsequent encounter: Secondary | ICD-10-CM | POA: Insufficient documentation

## 2023-02-12 DIAGNOSIS — T85518A Breakdown (mechanical) of other gastrointestinal prosthetic devices, implants and grafts, initial encounter: Secondary | ICD-10-CM

## 2023-02-12 DIAGNOSIS — R4182 Altered mental status, unspecified: Secondary | ICD-10-CM

## 2023-02-12 DIAGNOSIS — Z434 Encounter for attention to other artificial openings of digestive tract: Secondary | ICD-10-CM | POA: Diagnosis not present

## 2023-02-12 DIAGNOSIS — Y731 Therapeutic (nonsurgical) and rehabilitative gastroenterology and urology devices associated with adverse incidents: Secondary | ICD-10-CM | POA: Insufficient documentation

## 2023-02-12 DIAGNOSIS — R7401 Elevation of levels of liver transaminase levels: Secondary | ICD-10-CM

## 2023-02-12 DIAGNOSIS — K8000 Calculus of gallbladder with acute cholecystitis without obstruction: Secondary | ICD-10-CM

## 2023-02-12 DIAGNOSIS — N390 Urinary tract infection, site not specified: Secondary | ICD-10-CM

## 2023-02-12 HISTORY — PX: IR CHOLANGIOGRAM EXISTING TUBE: IMG6040

## 2023-02-12 MED ORDER — IOHEXOL 300 MG/ML  SOLN
50.0000 mL | Freq: Once | INTRAMUSCULAR | Status: AC | PRN
  Administered 2023-02-12: 10 mL

## 2023-02-12 NOTE — Procedures (Signed)
 Pre procedural Dx: Acute Cholelithiasis, poor operative candidate. Post procedural Dx: Same  Appropriately positioned and functioning internalized biliary drainage catheter via prior cholecystostomy tube track.  No evidence of choledocholithiasis.   PLAN:   The patient is to meet with a surgeon early next month to discuss his ultimate operative candidacy.   If the patient is deemed an operative candidate, would recommend maintaining the internalized biliary drainage catheter until the time of definitive cholecystectomy.   Assuming the patient is deemed not an operative candidate, the patient will return for drainage catheter exchange, evaluation and management in mid March.   At that time, the decision was made to either maintain the internalized biliary drainage catheter or to convert the internalized biliary drainage catheter back to a cholecystostomy tube.  I then would recommend proceeding with an at least 1 week capping trial of the cholecystostomy tube.    If the patient tolerates the capping trial of the cholecystostomy tube without incident, would recommend consideration for definitive cholecystomy tube removal.   Above was discussed at length with the patient and the patient's wife who are in agreement with the proposed plan of care.   Gordy Roulette, MD Pager #: [phone]

## 2023-02-15 DIAGNOSIS — D649 Anemia, unspecified: Secondary | ICD-10-CM | POA: Diagnosis not present

## 2023-02-15 DIAGNOSIS — N3281 Overactive bladder: Secondary | ICD-10-CM | POA: Diagnosis not present

## 2023-02-15 DIAGNOSIS — Z466 Encounter for fitting and adjustment of urinary device: Secondary | ICD-10-CM | POA: Diagnosis not present

## 2023-02-15 DIAGNOSIS — R339 Retention of urine, unspecified: Secondary | ICD-10-CM | POA: Diagnosis not present

## 2023-02-15 DIAGNOSIS — I10 Essential (primary) hypertension: Secondary | ICD-10-CM | POA: Diagnosis not present

## 2023-02-15 DIAGNOSIS — Z794 Long term (current) use of insulin: Secondary | ICD-10-CM | POA: Diagnosis not present

## 2023-02-15 DIAGNOSIS — G35 Multiple sclerosis: Secondary | ICD-10-CM | POA: Diagnosis not present

## 2023-02-15 DIAGNOSIS — I471 Supraventricular tachycardia, unspecified: Secondary | ICD-10-CM | POA: Diagnosis not present

## 2023-02-15 DIAGNOSIS — Z6828 Body mass index (BMI) 28.0-28.9, adult: Secondary | ICD-10-CM | POA: Diagnosis not present

## 2023-02-15 DIAGNOSIS — E876 Hypokalemia: Secondary | ICD-10-CM | POA: Diagnosis not present

## 2023-02-15 DIAGNOSIS — E119 Type 2 diabetes mellitus without complications: Secondary | ICD-10-CM | POA: Diagnosis not present

## 2023-02-15 DIAGNOSIS — N390 Urinary tract infection, site not specified: Secondary | ICD-10-CM | POA: Diagnosis not present

## 2023-02-15 DIAGNOSIS — Z436 Encounter for attention to other artificial openings of urinary tract: Secondary | ICD-10-CM | POA: Diagnosis not present

## 2023-02-15 DIAGNOSIS — Z9181 History of falling: Secondary | ICD-10-CM | POA: Diagnosis not present

## 2023-02-15 DIAGNOSIS — N200 Calculus of kidney: Secondary | ICD-10-CM | POA: Diagnosis not present

## 2023-02-15 DIAGNOSIS — E785 Hyperlipidemia, unspecified: Secondary | ICD-10-CM | POA: Diagnosis not present

## 2023-02-15 DIAGNOSIS — Z8673 Personal history of transient ischemic attack (TIA), and cerebral infarction without residual deficits: Secondary | ICD-10-CM | POA: Diagnosis not present

## 2023-02-17 DIAGNOSIS — N390 Urinary tract infection, site not specified: Secondary | ICD-10-CM | POA: Diagnosis not present

## 2023-02-17 DIAGNOSIS — Z6828 Body mass index (BMI) 28.0-28.9, adult: Secondary | ICD-10-CM | POA: Diagnosis not present

## 2023-02-17 DIAGNOSIS — E876 Hypokalemia: Secondary | ICD-10-CM | POA: Diagnosis not present

## 2023-02-17 DIAGNOSIS — N3281 Overactive bladder: Secondary | ICD-10-CM | POA: Diagnosis not present

## 2023-02-17 DIAGNOSIS — I10 Essential (primary) hypertension: Secondary | ICD-10-CM | POA: Diagnosis not present

## 2023-02-17 DIAGNOSIS — Z8673 Personal history of transient ischemic attack (TIA), and cerebral infarction without residual deficits: Secondary | ICD-10-CM | POA: Diagnosis not present

## 2023-02-17 DIAGNOSIS — E119 Type 2 diabetes mellitus without complications: Secondary | ICD-10-CM | POA: Diagnosis not present

## 2023-02-17 DIAGNOSIS — Z9181 History of falling: Secondary | ICD-10-CM | POA: Diagnosis not present

## 2023-02-17 DIAGNOSIS — E785 Hyperlipidemia, unspecified: Secondary | ICD-10-CM | POA: Diagnosis not present

## 2023-02-17 DIAGNOSIS — I471 Supraventricular tachycardia, unspecified: Secondary | ICD-10-CM | POA: Diagnosis not present

## 2023-02-17 DIAGNOSIS — D649 Anemia, unspecified: Secondary | ICD-10-CM | POA: Diagnosis not present

## 2023-02-17 DIAGNOSIS — R339 Retention of urine, unspecified: Secondary | ICD-10-CM | POA: Diagnosis not present

## 2023-02-17 DIAGNOSIS — G35 Multiple sclerosis: Secondary | ICD-10-CM | POA: Diagnosis not present

## 2023-02-17 DIAGNOSIS — Z466 Encounter for fitting and adjustment of urinary device: Secondary | ICD-10-CM | POA: Diagnosis not present

## 2023-02-17 DIAGNOSIS — N200 Calculus of kidney: Secondary | ICD-10-CM | POA: Diagnosis not present

## 2023-02-17 DIAGNOSIS — Z794 Long term (current) use of insulin: Secondary | ICD-10-CM | POA: Diagnosis not present

## 2023-02-17 DIAGNOSIS — Z436 Encounter for attention to other artificial openings of urinary tract: Secondary | ICD-10-CM | POA: Diagnosis not present

## 2023-02-23 DIAGNOSIS — I471 Supraventricular tachycardia, unspecified: Secondary | ICD-10-CM | POA: Diagnosis not present

## 2023-02-23 DIAGNOSIS — D649 Anemia, unspecified: Secondary | ICD-10-CM | POA: Diagnosis not present

## 2023-02-23 DIAGNOSIS — Z6828 Body mass index (BMI) 28.0-28.9, adult: Secondary | ICD-10-CM | POA: Diagnosis not present

## 2023-02-23 DIAGNOSIS — Z466 Encounter for fitting and adjustment of urinary device: Secondary | ICD-10-CM | POA: Diagnosis not present

## 2023-02-23 DIAGNOSIS — N179 Acute kidney failure, unspecified: Secondary | ICD-10-CM | POA: Diagnosis not present

## 2023-02-23 DIAGNOSIS — R339 Retention of urine, unspecified: Secondary | ICD-10-CM | POA: Diagnosis not present

## 2023-02-23 DIAGNOSIS — I679 Cerebrovascular disease, unspecified: Secondary | ICD-10-CM | POA: Diagnosis not present

## 2023-02-23 DIAGNOSIS — Z8673 Personal history of transient ischemic attack (TIA), and cerebral infarction without residual deficits: Secondary | ICD-10-CM | POA: Diagnosis not present

## 2023-02-23 DIAGNOSIS — Z9181 History of falling: Secondary | ICD-10-CM | POA: Diagnosis not present

## 2023-02-23 DIAGNOSIS — E119 Type 2 diabetes mellitus without complications: Secondary | ICD-10-CM | POA: Diagnosis not present

## 2023-02-23 DIAGNOSIS — I10 Essential (primary) hypertension: Secondary | ICD-10-CM | POA: Diagnosis not present

## 2023-02-23 DIAGNOSIS — G35 Multiple sclerosis: Secondary | ICD-10-CM | POA: Diagnosis not present

## 2023-02-23 DIAGNOSIS — Z87442 Personal history of urinary calculi: Secondary | ICD-10-CM | POA: Diagnosis not present

## 2023-02-23 DIAGNOSIS — E785 Hyperlipidemia, unspecified: Secondary | ICD-10-CM | POA: Diagnosis not present

## 2023-02-23 DIAGNOSIS — N390 Urinary tract infection, site not specified: Secondary | ICD-10-CM | POA: Diagnosis not present

## 2023-02-23 DIAGNOSIS — Z436 Encounter for attention to other artificial openings of urinary tract: Secondary | ICD-10-CM | POA: Diagnosis not present

## 2023-02-23 DIAGNOSIS — A419 Sepsis, unspecified organism: Secondary | ICD-10-CM | POA: Diagnosis not present

## 2023-02-23 DIAGNOSIS — Z794 Long term (current) use of insulin: Secondary | ICD-10-CM | POA: Diagnosis not present

## 2023-02-23 DIAGNOSIS — M1990 Unspecified osteoarthritis, unspecified site: Secondary | ICD-10-CM | POA: Diagnosis not present

## 2023-02-23 DIAGNOSIS — N3281 Overactive bladder: Secondary | ICD-10-CM | POA: Diagnosis not present

## 2023-02-23 DIAGNOSIS — E876 Hypokalemia: Secondary | ICD-10-CM | POA: Diagnosis not present

## 2023-02-23 DIAGNOSIS — R4189 Other symptoms and signs involving cognitive functions and awareness: Secondary | ICD-10-CM | POA: Diagnosis not present

## 2023-03-04 DIAGNOSIS — Z436 Encounter for attention to other artificial openings of urinary tract: Secondary | ICD-10-CM | POA: Diagnosis not present

## 2023-03-04 DIAGNOSIS — Z794 Long term (current) use of insulin: Secondary | ICD-10-CM | POA: Diagnosis not present

## 2023-03-04 DIAGNOSIS — I679 Cerebrovascular disease, unspecified: Secondary | ICD-10-CM | POA: Diagnosis not present

## 2023-03-04 DIAGNOSIS — I10 Essential (primary) hypertension: Secondary | ICD-10-CM | POA: Diagnosis not present

## 2023-03-04 DIAGNOSIS — E119 Type 2 diabetes mellitus without complications: Secondary | ICD-10-CM | POA: Diagnosis not present

## 2023-03-04 DIAGNOSIS — R4189 Other symptoms and signs involving cognitive functions and awareness: Secondary | ICD-10-CM | POA: Diagnosis not present

## 2023-03-04 DIAGNOSIS — Z8673 Personal history of transient ischemic attack (TIA), and cerebral infarction without residual deficits: Secondary | ICD-10-CM | POA: Diagnosis not present

## 2023-03-04 DIAGNOSIS — D649 Anemia, unspecified: Secondary | ICD-10-CM | POA: Diagnosis not present

## 2023-03-04 DIAGNOSIS — E876 Hypokalemia: Secondary | ICD-10-CM | POA: Diagnosis not present

## 2023-03-04 DIAGNOSIS — N390 Urinary tract infection, site not specified: Secondary | ICD-10-CM | POA: Diagnosis not present

## 2023-03-04 DIAGNOSIS — N179 Acute kidney failure, unspecified: Secondary | ICD-10-CM | POA: Diagnosis not present

## 2023-03-04 DIAGNOSIS — A419 Sepsis, unspecified organism: Secondary | ICD-10-CM | POA: Diagnosis not present

## 2023-03-04 DIAGNOSIS — M1990 Unspecified osteoarthritis, unspecified site: Secondary | ICD-10-CM | POA: Diagnosis not present

## 2023-03-04 DIAGNOSIS — Z466 Encounter for fitting and adjustment of urinary device: Secondary | ICD-10-CM | POA: Diagnosis not present

## 2023-03-04 DIAGNOSIS — Z9181 History of falling: Secondary | ICD-10-CM | POA: Diagnosis not present

## 2023-03-04 DIAGNOSIS — R339 Retention of urine, unspecified: Secondary | ICD-10-CM | POA: Diagnosis not present

## 2023-03-04 DIAGNOSIS — E785 Hyperlipidemia, unspecified: Secondary | ICD-10-CM | POA: Diagnosis not present

## 2023-03-04 DIAGNOSIS — Z6828 Body mass index (BMI) 28.0-28.9, adult: Secondary | ICD-10-CM | POA: Diagnosis not present

## 2023-03-04 DIAGNOSIS — G35 Multiple sclerosis: Secondary | ICD-10-CM | POA: Diagnosis not present

## 2023-03-04 DIAGNOSIS — I471 Supraventricular tachycardia, unspecified: Secondary | ICD-10-CM | POA: Diagnosis not present

## 2023-03-04 DIAGNOSIS — Z87442 Personal history of urinary calculi: Secondary | ICD-10-CM | POA: Diagnosis not present

## 2023-03-04 DIAGNOSIS — N3281 Overactive bladder: Secondary | ICD-10-CM | POA: Diagnosis not present

## 2023-03-10 DIAGNOSIS — Z794 Long term (current) use of insulin: Secondary | ICD-10-CM | POA: Diagnosis not present

## 2023-03-10 DIAGNOSIS — N179 Acute kidney failure, unspecified: Secondary | ICD-10-CM | POA: Diagnosis not present

## 2023-03-10 DIAGNOSIS — N3281 Overactive bladder: Secondary | ICD-10-CM | POA: Diagnosis not present

## 2023-03-10 DIAGNOSIS — I10 Essential (primary) hypertension: Secondary | ICD-10-CM | POA: Diagnosis not present

## 2023-03-10 DIAGNOSIS — A419 Sepsis, unspecified organism: Secondary | ICD-10-CM | POA: Diagnosis not present

## 2023-03-10 DIAGNOSIS — D649 Anemia, unspecified: Secondary | ICD-10-CM | POA: Diagnosis not present

## 2023-03-10 DIAGNOSIS — G35 Multiple sclerosis: Secondary | ICD-10-CM | POA: Diagnosis not present

## 2023-03-10 DIAGNOSIS — Z436 Encounter for attention to other artificial openings of urinary tract: Secondary | ICD-10-CM | POA: Diagnosis not present

## 2023-03-10 DIAGNOSIS — E785 Hyperlipidemia, unspecified: Secondary | ICD-10-CM | POA: Diagnosis not present

## 2023-03-10 DIAGNOSIS — Z87442 Personal history of urinary calculi: Secondary | ICD-10-CM | POA: Diagnosis not present

## 2023-03-10 DIAGNOSIS — N390 Urinary tract infection, site not specified: Secondary | ICD-10-CM | POA: Diagnosis not present

## 2023-03-10 DIAGNOSIS — Z6828 Body mass index (BMI) 28.0-28.9, adult: Secondary | ICD-10-CM | POA: Diagnosis not present

## 2023-03-10 DIAGNOSIS — I679 Cerebrovascular disease, unspecified: Secondary | ICD-10-CM | POA: Diagnosis not present

## 2023-03-10 DIAGNOSIS — E876 Hypokalemia: Secondary | ICD-10-CM | POA: Diagnosis not present

## 2023-03-10 DIAGNOSIS — I471 Supraventricular tachycardia, unspecified: Secondary | ICD-10-CM | POA: Diagnosis not present

## 2023-03-10 DIAGNOSIS — M1990 Unspecified osteoarthritis, unspecified site: Secondary | ICD-10-CM | POA: Diagnosis not present

## 2023-03-10 DIAGNOSIS — Z8673 Personal history of transient ischemic attack (TIA), and cerebral infarction without residual deficits: Secondary | ICD-10-CM | POA: Diagnosis not present

## 2023-03-10 DIAGNOSIS — E119 Type 2 diabetes mellitus without complications: Secondary | ICD-10-CM | POA: Diagnosis not present

## 2023-03-10 DIAGNOSIS — R4189 Other symptoms and signs involving cognitive functions and awareness: Secondary | ICD-10-CM | POA: Diagnosis not present

## 2023-03-10 DIAGNOSIS — Z466 Encounter for fitting and adjustment of urinary device: Secondary | ICD-10-CM | POA: Diagnosis not present

## 2023-03-10 DIAGNOSIS — Z9181 History of falling: Secondary | ICD-10-CM | POA: Diagnosis not present

## 2023-03-10 DIAGNOSIS — R339 Retention of urine, unspecified: Secondary | ICD-10-CM | POA: Diagnosis not present

## 2023-03-17 DIAGNOSIS — A419 Sepsis, unspecified organism: Secondary | ICD-10-CM | POA: Diagnosis not present

## 2023-03-17 DIAGNOSIS — N3281 Overactive bladder: Secondary | ICD-10-CM | POA: Diagnosis not present

## 2023-03-17 DIAGNOSIS — Z87442 Personal history of urinary calculi: Secondary | ICD-10-CM | POA: Diagnosis not present

## 2023-03-17 DIAGNOSIS — R339 Retention of urine, unspecified: Secondary | ICD-10-CM | POA: Diagnosis not present

## 2023-03-17 DIAGNOSIS — Z794 Long term (current) use of insulin: Secondary | ICD-10-CM | POA: Diagnosis not present

## 2023-03-17 DIAGNOSIS — N390 Urinary tract infection, site not specified: Secondary | ICD-10-CM | POA: Diagnosis not present

## 2023-03-17 DIAGNOSIS — G35 Multiple sclerosis: Secondary | ICD-10-CM | POA: Diagnosis not present

## 2023-03-17 DIAGNOSIS — I471 Supraventricular tachycardia, unspecified: Secondary | ICD-10-CM | POA: Diagnosis not present

## 2023-03-17 DIAGNOSIS — E785 Hyperlipidemia, unspecified: Secondary | ICD-10-CM | POA: Diagnosis not present

## 2023-03-17 DIAGNOSIS — Z466 Encounter for fitting and adjustment of urinary device: Secondary | ICD-10-CM | POA: Diagnosis not present

## 2023-03-17 DIAGNOSIS — Z8673 Personal history of transient ischemic attack (TIA), and cerebral infarction without residual deficits: Secondary | ICD-10-CM | POA: Diagnosis not present

## 2023-03-17 DIAGNOSIS — E876 Hypokalemia: Secondary | ICD-10-CM | POA: Diagnosis not present

## 2023-03-17 DIAGNOSIS — I10 Essential (primary) hypertension: Secondary | ICD-10-CM | POA: Diagnosis not present

## 2023-03-17 DIAGNOSIS — M1990 Unspecified osteoarthritis, unspecified site: Secondary | ICD-10-CM | POA: Diagnosis not present

## 2023-03-17 DIAGNOSIS — R4189 Other symptoms and signs involving cognitive functions and awareness: Secondary | ICD-10-CM | POA: Diagnosis not present

## 2023-03-17 DIAGNOSIS — Z436 Encounter for attention to other artificial openings of urinary tract: Secondary | ICD-10-CM | POA: Diagnosis not present

## 2023-03-17 DIAGNOSIS — Z6828 Body mass index (BMI) 28.0-28.9, adult: Secondary | ICD-10-CM | POA: Diagnosis not present

## 2023-03-17 DIAGNOSIS — E119 Type 2 diabetes mellitus without complications: Secondary | ICD-10-CM | POA: Diagnosis not present

## 2023-03-17 DIAGNOSIS — I679 Cerebrovascular disease, unspecified: Secondary | ICD-10-CM | POA: Diagnosis not present

## 2023-03-17 DIAGNOSIS — Z9181 History of falling: Secondary | ICD-10-CM | POA: Diagnosis not present

## 2023-03-17 DIAGNOSIS — N179 Acute kidney failure, unspecified: Secondary | ICD-10-CM | POA: Diagnosis not present

## 2023-03-17 DIAGNOSIS — D649 Anemia, unspecified: Secondary | ICD-10-CM | POA: Diagnosis not present

## 2023-03-29 ENCOUNTER — Telehealth: Payer: Self-pay

## 2023-03-29 DIAGNOSIS — R339 Retention of urine, unspecified: Secondary | ICD-10-CM

## 2023-03-29 NOTE — Telephone Encounter (Signed)
 Patient's wife called concern about patient having a UTI/foley infection. Patient's wife state's that patient's PCP Dr. Margo Aye did a urinalysis and wife was told to follow up with our office to see if Dr. Retta Diones is willing to treat patient UTI/Foley infection.  Patient's records was faxed and sent to provider. Patient's wife is aware a message will be sent to MD for advisement, wife verbalized understanding.

## 2023-03-29 NOTE — Telephone Encounter (Signed)
 Called Pt to relay message from MD Dahlstedt:   "Please call patient's wife.  Any urine taken from somebody with a catheter will always look like it is infected.  It is a dangerous thing to give the patient an antibiotic just because they think a person has an infection.  This will cause resistant bacteria to grow out, and then with that happening, any true infection has to be treated with intravenous antibiotics.  Unless they are having significant bladder pain, blood in the urine I do not think antibiotic should be used. "  Pt wife states urine was received when catheter was exchanged by home health   Dysuria  Patient called with c/o dysuria x 1 week days.  Pain: constant w/ confusion Severity:7/10  Associated Signs and Symptoms:  Fever: haven't taken it today Temp.N/A Chills: no Hematuria: no Urgency: yes Frequency: yes Hesitancy:Catheter in place Incontinence: Catheter in place  Nausea: no Vomiting: no  Urologic History:  Any Recent Urologic Surgeries or Procedures:no Recurrent UTI's:yes Cystitis: no  Prostatitis:Not sure  Kidney or Bladder Stones: yes last year removed in April Plan: Walk-in Clinic: no Appointment w/Physician: [no Lab visit scheduled for urine drop off: No Advice given: Pt wife advise to take husband to the ED wife states her husband already has alter state mental due to spots on the brain and doesn't want to take her husband to the ED Do you take on daily medications for UTI suppression No

## 2023-03-30 MED ORDER — OXYBUTYNIN CHLORIDE 5 MG PO TABS: 5.0000 mg | ORAL_TABLET | Freq: Three times a day (TID) | ORAL | 3 refills | Status: AC | PRN

## 2023-03-30 NOTE — Telephone Encounter (Signed)
 Called Pt spouse to let her know MD sent in a Rx for oxybutynin and if that doesn't seem to help her husbands' pain (Pt) and he gains a fever to take him to the ED see previous telephone encounter for details

## 2023-03-30 NOTE — Addendum Note (Signed)
 Addended by: Sarajane Jews on: 03/30/2023 09:52 AM   Modules accepted: Orders

## 2023-04-02 ENCOUNTER — Inpatient Hospital Stay (HOSPITAL_COMMUNITY): Admission: RE | Admit: 2023-04-02 | Payer: Medicare HMO | Source: Ambulatory Visit

## 2023-04-12 ENCOUNTER — Telehealth: Payer: Self-pay

## 2023-04-12 NOTE — Telephone Encounter (Signed)
 Call Pt wife per last telephone encounter. Wife states Pt was prescribed nitrofurantion 100 mg on 03/27  but still has some UTI like symptoms w/ 3 days left of abx. Symptoms include confusion, and on and off pain. Wife wanted to reiterate that her husband does have spots on the brain so sometimes Pt cannot disclose his symptoms. Wife also states Pt had a fever around 03/31 but broke once Pt started taking the abx and some tylenol

## 2023-04-12 NOTE — Telephone Encounter (Signed)
-----   Message from Bertram Millard Dahlstedt sent at 04/09/2023  1:49 PM EDT ----- Please call wife. Urine culture grew VERY resistant bacteria (since he has been treated with so many abx). If he is still having abnormal amt of symptoms, and Dr HAll is not treating him, we can try macrobid 1 po q 12 hrs #14. If he has been given abx no need to send in. ----- Message ----- From: Sarajane Jews, CMA Sent: 03/30/2023  11:10 AM EDT To: Marcine Matar, MD  [facility]

## 2023-04-13 NOTE — Telephone Encounter (Signed)
 Called Pt wife to relay message from MD Dahlstedt " let her know that if she is quite certain that it is a infection causing his symptoms, and he does not get better with the Macrodantin/Macrobid, really the only thing we can try is an IV antibiotic. " Pt wife voiced understanding

## 2023-04-15 DIAGNOSIS — Z794 Long term (current) use of insulin: Secondary | ICD-10-CM | POA: Diagnosis not present

## 2023-04-15 DIAGNOSIS — N179 Acute kidney failure, unspecified: Secondary | ICD-10-CM | POA: Diagnosis not present

## 2023-04-15 DIAGNOSIS — I471 Supraventricular tachycardia, unspecified: Secondary | ICD-10-CM | POA: Diagnosis not present

## 2023-04-15 DIAGNOSIS — R4189 Other symptoms and signs involving cognitive functions and awareness: Secondary | ICD-10-CM | POA: Diagnosis not present

## 2023-04-15 DIAGNOSIS — E876 Hypokalemia: Secondary | ICD-10-CM | POA: Diagnosis not present

## 2023-04-15 DIAGNOSIS — R339 Retention of urine, unspecified: Secondary | ICD-10-CM | POA: Diagnosis not present

## 2023-04-15 DIAGNOSIS — I679 Cerebrovascular disease, unspecified: Secondary | ICD-10-CM | POA: Diagnosis not present

## 2023-04-15 DIAGNOSIS — N3281 Overactive bladder: Secondary | ICD-10-CM | POA: Diagnosis not present

## 2023-04-15 DIAGNOSIS — E785 Hyperlipidemia, unspecified: Secondary | ICD-10-CM | POA: Diagnosis not present

## 2023-04-15 DIAGNOSIS — A419 Sepsis, unspecified organism: Secondary | ICD-10-CM | POA: Diagnosis not present

## 2023-04-15 DIAGNOSIS — Z8673 Personal history of transient ischemic attack (TIA), and cerebral infarction without residual deficits: Secondary | ICD-10-CM | POA: Diagnosis not present

## 2023-04-15 DIAGNOSIS — D649 Anemia, unspecified: Secondary | ICD-10-CM | POA: Diagnosis not present

## 2023-04-15 DIAGNOSIS — M1990 Unspecified osteoarthritis, unspecified site: Secondary | ICD-10-CM | POA: Diagnosis not present

## 2023-04-15 DIAGNOSIS — Z466 Encounter for fitting and adjustment of urinary device: Secondary | ICD-10-CM | POA: Diagnosis not present

## 2023-04-15 DIAGNOSIS — Z87442 Personal history of urinary calculi: Secondary | ICD-10-CM | POA: Diagnosis not present

## 2023-04-15 DIAGNOSIS — Z436 Encounter for attention to other artificial openings of urinary tract: Secondary | ICD-10-CM | POA: Diagnosis not present

## 2023-04-15 DIAGNOSIS — E119 Type 2 diabetes mellitus without complications: Secondary | ICD-10-CM | POA: Diagnosis not present

## 2023-04-15 DIAGNOSIS — Z6828 Body mass index (BMI) 28.0-28.9, adult: Secondary | ICD-10-CM | POA: Diagnosis not present

## 2023-04-15 DIAGNOSIS — N390 Urinary tract infection, site not specified: Secondary | ICD-10-CM | POA: Diagnosis not present

## 2023-04-15 DIAGNOSIS — G35 Multiple sclerosis: Secondary | ICD-10-CM | POA: Diagnosis not present

## 2023-04-15 DIAGNOSIS — Z9181 History of falling: Secondary | ICD-10-CM | POA: Diagnosis not present

## 2023-04-15 DIAGNOSIS — I10 Essential (primary) hypertension: Secondary | ICD-10-CM | POA: Diagnosis not present

## 2023-04-16 ENCOUNTER — Other Ambulatory Visit (HOSPITAL_COMMUNITY): Payer: Self-pay | Admitting: Student

## 2023-04-16 ENCOUNTER — Ambulatory Visit (HOSPITAL_COMMUNITY)
Admission: RE | Admit: 2023-04-16 | Discharge: 2023-04-16 | Disposition: A | Discharge status: Home / Self Care | Source: Ambulatory Visit | Attending: Student | Admitting: Student

## 2023-04-16 DIAGNOSIS — T85520D Displacement of bile duct prosthesis, subsequent encounter: Secondary | ICD-10-CM

## 2023-04-16 DIAGNOSIS — Z434 Encounter for attention to other artificial openings of digestive tract: Secondary | ICD-10-CM | POA: Insufficient documentation

## 2023-04-16 HISTORY — PX: IR EXCHANGE BILIARY DRAIN: IMG6046

## 2023-04-16 HISTORY — PX: IR CHOLANGIOGRAM EXISTING TUBE: IMG6040

## 2023-04-16 MED ORDER — LIDOCAINE HCL 1 % IJ SOLN
INTRAMUSCULAR | Status: AC
  Filled 2023-04-16: qty 20

## 2023-04-16 NOTE — Procedures (Signed)
 Interventional Radiology Procedure:   Indications: Chronic cholecystostomy that is internalized  Procedure: Cholangiogram and exchange to cholecystostomy tube  Findings: Cholangiogram demonstrated patent cystic duct and CBD.  Gallbladder is completely decompressed.  New 10 Fr drain positioned in gallbladder and capped.  Complications: None     EBL: Minimal  Plan: Keep cholecystostomy tube capped.  Continue with routine flushing at home.  Follow up in 6 weeks for injection and possible removal.   Alexi Dorminey R. Lowella Dandy, MD  Pager: [phone]

## 2023-04-21 ENCOUNTER — Other Ambulatory Visit (HOSPITAL_COMMUNITY): Payer: Self-pay | Admitting: Student

## 2023-04-21 ENCOUNTER — Encounter (HOSPITAL_COMMUNITY): Payer: Self-pay

## 2023-04-21 DIAGNOSIS — T85520D Displacement of bile duct prosthesis, subsequent encounter: Secondary | ICD-10-CM

## 2023-05-13 DIAGNOSIS — Z9181 History of falling: Secondary | ICD-10-CM | POA: Diagnosis not present

## 2023-05-13 DIAGNOSIS — G35 Multiple sclerosis: Secondary | ICD-10-CM | POA: Diagnosis not present

## 2023-05-13 DIAGNOSIS — I679 Cerebrovascular disease, unspecified: Secondary | ICD-10-CM | POA: Diagnosis not present

## 2023-05-13 DIAGNOSIS — Z87442 Personal history of urinary calculi: Secondary | ICD-10-CM | POA: Diagnosis not present

## 2023-05-13 DIAGNOSIS — E785 Hyperlipidemia, unspecified: Secondary | ICD-10-CM | POA: Diagnosis not present

## 2023-05-13 DIAGNOSIS — Z466 Encounter for fitting and adjustment of urinary device: Secondary | ICD-10-CM | POA: Diagnosis not present

## 2023-05-13 DIAGNOSIS — Z794 Long term (current) use of insulin: Secondary | ICD-10-CM | POA: Diagnosis not present

## 2023-05-13 DIAGNOSIS — I10 Essential (primary) hypertension: Secondary | ICD-10-CM | POA: Diagnosis not present

## 2023-05-13 DIAGNOSIS — Z436 Encounter for attention to other artificial openings of urinary tract: Secondary | ICD-10-CM | POA: Diagnosis not present

## 2023-05-13 DIAGNOSIS — N179 Acute kidney failure, unspecified: Secondary | ICD-10-CM | POA: Diagnosis not present

## 2023-05-13 DIAGNOSIS — N3281 Overactive bladder: Secondary | ICD-10-CM | POA: Diagnosis not present

## 2023-05-13 DIAGNOSIS — M1990 Unspecified osteoarthritis, unspecified site: Secondary | ICD-10-CM | POA: Diagnosis not present

## 2023-05-13 DIAGNOSIS — Z87440 Personal history of urinary (tract) infections: Secondary | ICD-10-CM | POA: Diagnosis not present

## 2023-05-13 DIAGNOSIS — Z8673 Personal history of transient ischemic attack (TIA), and cerebral infarction without residual deficits: Secondary | ICD-10-CM | POA: Diagnosis not present

## 2023-05-13 DIAGNOSIS — E119 Type 2 diabetes mellitus without complications: Secondary | ICD-10-CM | POA: Diagnosis not present

## 2023-05-13 DIAGNOSIS — R339 Retention of urine, unspecified: Secondary | ICD-10-CM | POA: Diagnosis not present

## 2023-05-13 DIAGNOSIS — R4189 Other symptoms and signs involving cognitive functions and awareness: Secondary | ICD-10-CM | POA: Diagnosis not present

## 2023-05-13 DIAGNOSIS — Z6828 Body mass index (BMI) 28.0-28.9, adult: Secondary | ICD-10-CM | POA: Diagnosis not present

## 2023-05-13 DIAGNOSIS — E876 Hypokalemia: Secondary | ICD-10-CM | POA: Diagnosis not present

## 2023-05-13 DIAGNOSIS — D649 Anemia, unspecified: Secondary | ICD-10-CM | POA: Diagnosis not present

## 2023-05-13 DIAGNOSIS — I471 Supraventricular tachycardia, unspecified: Secondary | ICD-10-CM | POA: Diagnosis not present

## 2023-05-18 ENCOUNTER — Telehealth: Payer: Self-pay | Admitting: *Deleted

## 2023-05-18 NOTE — Telephone Encounter (Signed)
 [facility] [phone] reports that Troy Samuel, NP did home visit on pt through Dr. Del Favia (PCP). Pt more agitated, depressed and sundowning. Wanting to know if Dr. Godwin Lat agreeable to them starting him on either Lexapro or Zoloft. I placed on hold and spoke with Dr. Godwin Lat. He was agreeable to plan. I relayed. I also offered to coordinate getting pt scheduled for updated visit to see Dr. Godwin Lat since he is past due. She will reach out to wife to call office to schedule appt for him.

## 2023-05-19 ENCOUNTER — Telehealth: Payer: Self-pay

## 2023-05-19 NOTE — Telephone Encounter (Signed)
 Patient/wife left vm wanting to proceed with SP tube placement. Return call to patient/wife making them aware I will reach to Dr. Joie Narrow about placing order to Intervention Radiology.

## 2023-05-26 ENCOUNTER — Other Ambulatory Visit: Payer: Self-pay | Admitting: Urology

## 2023-05-26 DIAGNOSIS — R339 Retention of urine, unspecified: Secondary | ICD-10-CM

## 2023-05-26 NOTE — Telephone Encounter (Signed)
 Tried  calling patient with no answer. Left detailed voiced message making patient aware.

## 2023-05-28 ENCOUNTER — Ambulatory Visit (HOSPITAL_COMMUNITY)
Admission: RE | Admit: 2023-05-28 | Discharge: 2023-05-28 | Disposition: A | Discharge status: Home / Self Care | Source: Ambulatory Visit | Attending: Student | Admitting: Student

## 2023-05-28 DIAGNOSIS — T85520D Displacement of bile duct prosthesis, subsequent encounter: Secondary | ICD-10-CM | POA: Diagnosis not present

## 2023-05-28 DIAGNOSIS — Z434 Encounter for attention to other artificial openings of digestive tract: Secondary | ICD-10-CM | POA: Diagnosis not present

## 2023-05-28 DIAGNOSIS — Y839 Surgical procedure, unspecified as the cause of abnormal reaction of the patient, or of later complication, without mention of misadventure at the time of the procedure: Secondary | ICD-10-CM | POA: Diagnosis not present

## 2023-05-28 HISTORY — PX: IR CHOLANGIOGRAM EXISTING TUBE: IMG6040

## 2023-05-28 MED ORDER — IOHEXOL 300 MG/ML  SOLN
50.0000 mL | Freq: Once | INTRAMUSCULAR | Status: AC | PRN
  Administered 2023-05-28: 12 mL

## 2023-05-28 NOTE — Procedures (Signed)
 Interventional Radiology Procedure Note  Procedure: Image guided injection of perc chole, with removal  Findings: patent ducts, including cystic duct and CBD.  Enters duodenum. Has had successful capping trial since January.  Discussed with Troy Randall, his wife, and decision to remove. She understands he will need eval if new pain, fever, or biliary colic.   Complications: None  Recommendations: - Routine wound care, with dry dressing changes as needed    Signed,  Troy Randall. Mabel Savage, DO

## 2023-06-04 DIAGNOSIS — E1165 Type 2 diabetes mellitus with hyperglycemia: Secondary | ICD-10-CM | POA: Diagnosis not present

## 2023-06-04 DIAGNOSIS — Z789 Other specified health status: Secondary | ICD-10-CM | POA: Diagnosis not present

## 2023-06-04 DIAGNOSIS — Z7409 Other reduced mobility: Secondary | ICD-10-CM | POA: Diagnosis not present

## 2023-06-04 DIAGNOSIS — F331 Major depressive disorder, recurrent, moderate: Secondary | ICD-10-CM | POA: Diagnosis not present

## 2023-06-05 DIAGNOSIS — N401 Enlarged prostate with lower urinary tract symptoms: Secondary | ICD-10-CM | POA: Diagnosis not present

## 2023-06-05 DIAGNOSIS — E1165 Type 2 diabetes mellitus with hyperglycemia: Secondary | ICD-10-CM | POA: Diagnosis not present

## 2023-06-05 DIAGNOSIS — E782 Mixed hyperlipidemia: Secondary | ICD-10-CM | POA: Diagnosis not present

## 2023-06-05 DIAGNOSIS — I10 Essential (primary) hypertension: Secondary | ICD-10-CM | POA: Diagnosis not present

## 2023-06-07 ENCOUNTER — Other Ambulatory Visit (HOSPITAL_COMMUNITY): Payer: Self-pay | Admitting: Urology

## 2023-06-07 DIAGNOSIS — R339 Retention of urine, unspecified: Secondary | ICD-10-CM

## 2023-06-08 ENCOUNTER — Other Ambulatory Visit: Payer: Self-pay | Admitting: Urology

## 2023-06-08 DIAGNOSIS — R339 Retention of urine, unspecified: Secondary | ICD-10-CM

## 2023-06-09 ENCOUNTER — Encounter: Payer: Self-pay | Admitting: Neurology

## 2023-06-09 ENCOUNTER — Telehealth: Admitting: Neurology

## 2023-06-09 DIAGNOSIS — N390 Urinary tract infection, site not specified: Secondary | ICD-10-CM | POA: Diagnosis not present

## 2023-06-09 DIAGNOSIS — Z8669 Personal history of other diseases of the nervous system and sense organs: Secondary | ICD-10-CM | POA: Diagnosis not present

## 2023-06-09 DIAGNOSIS — G9341 Metabolic encephalopathy: Secondary | ICD-10-CM

## 2023-06-09 DIAGNOSIS — R269 Unspecified abnormalities of gait and mobility: Secondary | ICD-10-CM | POA: Diagnosis not present

## 2023-06-09 DIAGNOSIS — Z8673 Personal history of transient ischemic attack (TIA), and cerebral infarction without residual deficits: Secondary | ICD-10-CM

## 2023-06-09 NOTE — Progress Notes (Signed)
 [facility]  PATIENT: Troy Randall DOB: 1960/02/06  REFERRING DOCTOR OR PCP:  Dr. Del Favia SOURCE: Patient, extensive notes, laboratory tests, imaging results, other results from long hospitalization.  Imaging studies were personally reviewed.  _________________________________   HISTORICAL  CHIEF COMPLAINT:  Encephalopathy  HISTORY OF PRESENT ILLNESS:  Troy Randall is a 63 y.o. man with CNS demyelinating disease and history of strokes  Virtual Visit via Video Note I connected with Houston Mace on 06/09/23 at  2:00 PM EDT by a video enabled telemedicine application and verified that I am speaking with the correct person.   Although the patient appeared to be logged into MyChart video, a connection was not available.   I discussed the limitations of evaluation and management by telemedicine and the availability of in person appointments. The patient expressed understanding and agreed to proceed.  Patient at home; provider in office  Update 06/09/2023 I spoke to him and his wife.  They both feel that he has been stable for the most part.  He has persistent reduced focus/attention and reduced processing.  Speech is slowed but no frank aphasia.    He can feed himself and has no dysphagia.  He has been mostly stable.  In January 2025, he had urosepsis and cholecystitis.   A biliary drain was placed (pulled out in May).  He had a milder UTI in April treated as an outpatient.Aaron Aas  He is always confused if he has a serious infection.  He has a Foley urinary catheter and a suprapubic is being planned  Gait and strength are doing about the same.  He can take a few steps with a walker (up to 25-30 feet) and transfer with assistance to get to a seated position at edge of bed.  He is doing Home PT.   although he improved compared to 2023, there has not been much further improvement.    He does not have numbness.    Due to urinary retention, he has a Foley catheter.    He sleeps  well at night and takes 1-2 naps during the day.        His initial MRI showed multiple small enhancing foci.  Etiology was uncertain with septic emboli the most likely etiology.  However, demyelination was also considered in the differential diagnosis, either MS or ADEM.   Of note, the CSF did not show oligoclonal bands.  He had an MRI in March 2024 and there were no new lesions.  The abnormal enhancement resolved.  At the last visit we had discussed that if 1 more MRI next year also shows stability, then the likelihood of MS would be very small.   History of neurologic events: He had had 2 strokes in the past, both affecting his speech.  The last stroke was in April 2023.  MRI 04/28/2021 had shown a small acute stroke in the subcortical white matter of the left frontal lobe.  Additionally there were several lacunar infarctions in the cerebral hemispheres and brainstem  He was neurologically baseline until early September 2023.  He had a normal morning on 09/04/2021 but noted GI bleed prompting him to go to the ED.   he had jejunal bleeding on colonoscopy treated with embolization.  He was noted to have altered mental status a couple days later while in the hospital.  He was found to have sepsis.   He became less responsive.  He was noted to have renal failure and due to respiratory failure was  intubated.      CT scan 09/12/2021 showed interval development of multiple white matter foci compared to April 2023 and MRI of the brain was recommended for further evaluation.  MRI of the brain 09/20/2021 and 09/21/2021 showed development of multiple enhancing foci in the hemispheres.  The differential diagnosis was multiple strokes versus demyelination.  CSF evaluation 09/22/2021 showed normal cell count and protein and cultures.  IgG index was normal.  Oligoclonal bands were negative (he did have matched bands in both the serum and CSF).  HIV was negative.  He received IV steroids followed by plasmapheresis .  Due  to GI bleeds he received multiple blood transfusions while in the hospital.  Initially, he had been unresponsive but had regained consciousness with treatment of the sepsis.  He continues to have reduced mental status for several weeks and began to improve towards the end of September, early October but remain mute for several more days.  He remained bedbound for several weeks and began to participate with PT more during October.  He was admitted to rehab on 10/30/2021.  He continues to do outpatient rehab.     Over the last month he has had multiple episodes of infection treated with antibiotics.  He is currently off of antibiotics.  He has improved physically and cognitively.   He can walk 50 feet with a walker now.  He communicates effectively and answers questions appropriately but cognition is not at baseline.  Imaging: MRI of the brain 04/01/2022 showed resolution of all of the enhancing lesions.  Some of them hypointense on both FLAIR and T1-weighted images.  There were no new lesions compared to September 2023.  MRI of the brain 09/20/2021 and 09/21/2021 showed multiple T2/FLAIR hyperintense foci in the hemispheres many of which enhanced after contrast.  Additional nonenhancing foci were noted in the cerebral hemispheres, left cerebellar hemisphere and left middle cerebellar peduncle.  All of the enhancing foci were new compared to the 04/28/2021 MRI and most of the nonenhancing foci were present on the previous MRI.  MRI of the brain 10/03/2021 showed multiple T2/FLAIR hyperintense foci in the cerebral hemispheres, cerebellum, left middle cerebellar peduncle, pons.  Some of the foci enhanced after contrast.  MRI of the cervical spine 10/03/2021 showed a normal spinal cord.  MRI of the thoracic spine 10/03/2021 showed subtle T2 hyperintense foci at T4-T5 and T6.  No definite enhancement noted.  Study was difficult to interpret due to movement artifact.  MRI of the brain 04/28/2021 showed several  infratentorial and supratentorial lacunar infarctions.  There was an acute small stroke in the left frontal lobe.  Chronic lacunar infarctions were noted in the left middle cerebellar peduncle, inferior cerebellum and in the cerebral hemispheres, left greater than right  He has had additioanl UTIs since d/c and just finished a round of antibiotics.     He is doing PT and has made progress but other medical issues have led to temporary setback.     REVIEW OF SYSTEMS: Constitutional: No fevers, chills, sweats, or change in appetite Eyes: No visual changes, double vision, eye pain Ear, nose and throat: No hearing loss, ear pain, nasal congestion, sore throat Cardiovascular: No chest pain, palpitations Respiratory:  No shortness of breath at rest or with exertion.   No wheezes GastrointestinaI: No nausea, vomiting, diarrhea, abdominal pain, fecal incontinence Genitourinary: Currently has Foley catheter musculoskeletal:  No neck pain, back pain Integumentary: No rash, pruritus, skin lesions Neurological: as above Psychiatric: No depression at this  time.  No anxiety Endocrine: No palpitations, diaphoresis, change in appetite, change in weigh or increased thirst Hematologic/Lymphatic:  No anemia, purpura, petechiae. Allergic/Immunologic: No itchy/runny eyes, nasal congestion, recent allergic reactions, rashes  ALLERGIES: Allergies  Allergen Reactions   Bactrim  [Sulfamethoxazole -Trimethoprim ] Rash   Amoxicillin  Hives    Tolerated Cefepime     HOME MEDICATIONS:  Current Outpatient Medications:    Ascorbic Acid (VITAMIN C) 500 MG CHEW, Chew 1 tablet by mouth daily., Disp: , Rfl:    BD VEO INSULIN  SYRINGE U/F 31G X 15/64" 1 ML MISC,  USE AS DIRECTED, Disp: 100 each, Rfl: 2   diphenhydrAMINE  (BENADRYL  ALLERGY) 25 MG tablet, Take 25 mg by mouth every 6 (six) hours as needed for allergies., Disp: , Rfl:    glucose blood (ONETOUCH VERIO) test strip, TEST twice a day, Disp: 100 each, Rfl: 3    Insulin  Pen Needle (NOVOTWIST) 32G X 5 MM MISC, Use two daily to inject Victoza  and Toujeo ., Disp: 90 each, Rfl: 5   metoprolol  succinate (TOPROL -XL) 25 MG 24 hr tablet, Take 0.5 tablets (12.5 mg total) by mouth daily., Disp: 30 tablet, Rfl: 1   ONETOUCH DELICA LANCETS 33G MISC, Use to check blood sugar once a day dx code E11.65, Disp: 50 each, Rfl: 3   oxybutynin  (DITROPAN ) 5 MG tablet, Take 1 tablet (5 mg total) by mouth every 8 (eight) hours as needed for bladder spasms., Disp: 30 tablet, Rfl: 3   pantoprazole  (PROTONIX ) 40 MG tablet, Take 1 tablet (40 mg total) by mouth daily. (Patient taking differently: Take 40 mg by mouth daily before breakfast.), Disp: 30 tablet, Rfl: 0   potassium chloride  (KLOR-CON ) 10 MEQ tablet, Take 10 mEq by mouth daily. (Patient not taking: Reported on 01/17/2023), Disp: , Rfl:    Probiotic Product (PROBIOTIC PO), Take 1 tablet by mouth daily., Disp: , Rfl:    rosuvastatin  (CRESTOR ) 40 MG tablet, Take 1 tablet (40 mg total) by mouth daily., Disp: 30 tablet, Rfl: 1   traZODone  (DESYREL ) 50 MG tablet, Take 0.5-1 tablets (25-50 mg total) by mouth at bedtime as needed for sleep. (Patient taking differently: Take 50 mg by mouth at bedtime.), Disp: , Rfl:    TRESIBA  FLEXTOUCH 200 UNIT/ML FlexTouch Pen, Inject under skin 15 units twice a day. (Patient taking differently: Inject 18 Units into the skin See admin instructions. Inject 18 units into the skin daily- BGL must be 130 or greater), Disp: 3 mL, Rfl: 0   TYLENOL  500 MG tablet, Take 1 tablet (500 mg total) by mouth every 6 (six) hours as needed for mild pain or headache., Disp: 30 tablet, Rfl: 0  PAST MEDICAL HISTORY: Past Medical History:  Diagnosis Date   Anemia    Arthritis    CVA (cerebral vascular accident) (HCC)    last CVA 04/2021   Diabetes mellitus without complication (HCC)    type 2   Hyperlipidemia    Hypertension    Kidney stones     PAST SURGICAL HISTORY: Past Surgical History:  Procedure  Laterality Date   ANKLE SURGERY     BIOPSY  09/10/2021   Procedure: BIOPSY;  Surgeon: Alvis Jourdain, MD;  Location: [facility] ENDOSCOPY;  Service: Gastroenterology;;   BIOPSY  10/07/2021   Procedure: BIOPSY;  Surgeon: Alvis Jourdain, MD;  Location: [facility] ENDOSCOPY;  Service: Gastroenterology;;   COLONOSCOPY N/A 10/07/2021   Procedure: COLONOSCOPY;  Surgeon: Alvis Jourdain, MD;  Location: [facility] ENDOSCOPY;  Service: Gastroenterology;  Laterality: N/A;   COLONOSCOPY WITH PROPOFOL  N/A 09/06/2021  Procedure: COLONOSCOPY WITH PROPOFOL ;  Surgeon: Urban Garden, MD;  Location: [facility] ENDO SUITE;  Service: Gastroenterology;  Laterality: N/A;   CYSTOSCOPY/URETEROSCOPY/HOLMIUM LASER/STENT PLACEMENT Left 03/30/2022   Procedure: CYSTOSCOPY/RETROGRADE/URETEROSCOPY/HOLMIUM LASER/STENT PLACEMENT;  Surgeon: Adelbert Homans, MD;  Location: [facility] ORS;  Service: Urology;  Laterality: Left;   ENTEROSCOPY N/A 09/10/2021   Procedure: ENTEROSCOPY;  Surgeon: Alvis Jourdain, MD;  Location: [facility] ENDOSCOPY;  Service: Gastroenterology;  Laterality: N/A;   ENTEROSCOPY  09/06/2021   Procedure: ENTEROSCOPY;  Surgeon: Urban Garden, MD;  Location: [facility] ENDO SUITE;  Service: Gastroenterology;;   ESOPHAGOGASTRODUODENOSCOPY (EGD) WITH PROPOFOL   09/06/2021   Procedure: ESOPHAGOGASTRODUODENOSCOPY (EGD) WITH PROPOFOL ;  Surgeon: Urban Garden, MD;  Location: [facility];  Service: Gastroenterology;;   Milta Alosa SIGMOIDOSCOPY N/A 09/19/2021   Procedure: Marlynn Singer;  Surgeon: Alvis Jourdain, MD;  Location: [facility] ENDOSCOPY;  Service: Gastroenterology;  Laterality: N/A;   FOREIGN BODY REMOVAL  09/19/2021   Procedure: FOREIGN BODY REMOVAL;  Surgeon: Alvis Jourdain, MD;  Location: [facility] ENDOSCOPY;  Service: Gastroenterology;;   Glendell Landry CAPSULE STUDY  09/06/2021   Procedure: GIVENS CAPSULE STUDY;  Surgeon: Umberto Ganong, Bearl Limes, MD;  Location: [facility];  Service: Gastroenterology;;   HERNIA REPAIR     umbilical x1  Incisional x1   INCISIONAL HERNIA REPAIR  04/13/2011   Procedure: LAPAROSCOPIC INCISIONAL HERNIA;  Surgeon: Beau Bound, MD;  Location: [facility] ORS;  Service: General;  Laterality: N/A;  Recurrent Laparoscopic Incisional Herniorraphy with Mesh   IR ANGIOGRAM SELECTIVE EACH ADDITIONAL VESSEL  09/09/2021   IR ANGIOGRAM VISCERAL SELECTIVE  09/07/2021   IR ANGIOGRAM VISCERAL SELECTIVE  09/06/2021   IR ANGIOGRAM VISCERAL SELECTIVE  09/06/2021   IR CHOLANGIOGRAM EXISTING TUBE  11/06/2021   IR CHOLANGIOGRAM EXISTING TUBE  02/12/2023   IR CHOLANGIOGRAM EXISTING TUBE  05/28/2023   IR EMBO ART  VEN HEMORR LYMPH EXTRAV  INC GUIDE ROADMAPPING  09/06/2021   IR EXCHANGE BILIARY DRAIN  10/24/2021   IR EXCHANGE BILIARY DRAIN  11/19/2021   IR EXCHANGE BILIARY DRAIN  01/19/2022   IR EXCHANGE BILIARY DRAIN  02/19/2022   IR EXCHANGE BILIARY DRAIN  04/01/2022   IR EXCHANGE BILIARY DRAIN  05/27/2022   IR EXCHANGE BILIARY DRAIN  07/24/2022   IR EXCHANGE BILIARY DRAIN  09/24/2022   IR EXCHANGE BILIARY DRAIN  11/19/2022   IR EXCHANGE BILIARY DRAIN  01/18/2023   IR EXCHANGE BILIARY DRAIN  01/19/2023   IR EXCHANGE BILIARY DRAIN  04/16/2023   IR GUIDED DRAIN W CATHETER PLACEMENT  09/07/2021   IR REMOVAL OF CALCULI/DEBRIS BILIARY DUCT/GB  01/19/2023   IR US  GUIDE BX ASP/DRAIN  09/07/2021   IR US  GUIDE VASC ACCESS RIGHT  09/07/2021   IR US  GUIDE VASC ACCESS RIGHT  09/06/2021   KIDNEY STONE SURGERY     RADIOLOGY WITH ANESTHESIA N/A 03/10/2022   Procedure: MRI WITH ANESTHESIA OF BRAIN WITH AND WITHOUT CONTRAST;  Surgeon: Radiologist, Medication, MD;  Location: [facility] OR;  Service: Radiology;  Laterality: N/A;    FAMILY HISTORY: Family History  Problem Relation Age of Onset   Heart failure Mother    Diabetes Father    Cancer Other    Heart attack Other    Anesthesia problems Neg Hx    Hypotension Neg Hx    Malignant hyperthermia Neg Hx    Pseudochol deficiency Neg Hx    Colon cancer Neg Hx    Inflammatory bowel disease Neg Hx     SOCIAL  HISTORY: Social History  Socioeconomic History   Marital status: Married    Spouse name: Not on file   Number of children: Not on file   Years of education: Not on file   Highest education level: Not on file  Occupational History   Not on file  Tobacco Use   Smoking status: Never   Smokeless tobacco: Former    Types: Snuff  Vaping Use   Vaping status: Never Used  Substance and Sexual Activity   Alcohol use: No   Drug use: No   Sexual activity: Yes    Birth control/protection: None  Other Topics Concern   Not on file  Social History Narrative   Live in [place], [place].   Married for over 30 years.   Has 2 children, age 47/30s. Has one grandchild, 19 months.    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    Eats all food groups.   Wears seatbelt.    Dip 1 can a day.    Social Drivers of Corporate investment banker Strain: Not on file  Food Insecurity: No Food Insecurity (02/04/2023)   Hunger Vital Sign    Worried About Running Out of Food in the Last Year: Never true    Ran Out of Food in the Last Year: Never true  Transportation Needs: No Transportation Needs (02/04/2023)   PRAPARE - Administrator, Civil Service (Medical): No    Lack of Transportation (Non-Medical): No  Physical Activity: Not on file  Stress: Not on file  Social Connections: Moderately Integrated (01/22/2023)   Social Connection and Isolation Panel [NHANES]    Frequency of Communication with Friends and Family: More than three times a week    Frequency of Social Gatherings with Friends and Family: More than three times a week    Attends Religious Services: Never    Database administrator or Organizations: Yes    Attends Banker Meetings: 1 to 4 times per year    Marital Status: Married  Catering manager Violence: Not At Risk (02/04/2023)   Humiliation, Afraid, Rape, and Kick questionnaire    Fear of Current or Ex-Partner: No    Emotionally  Abused: No    Physically Abused: No    Sexually Abused: No       DIAGNOSTIC DATA (LABS, IMAGING, TESTING) - I reviewed patient records, labs, notes, testing and imaging myself where available.  Lab Results  Component Value Date   WBC 5.5 01/21/2023   HGB 11.7 (L) 01/21/2023   HCT 34.4 (L) 01/21/2023   MCV 83.1 01/21/2023   PLT 142 (L) 01/21/2023      Component Value Date/Time   NA 136 01/21/2023 0408   K 2.8 (L) 01/21/2023 0408   CL 106 01/21/2023 0408   CO2 23 01/21/2023 0408   GLUCOSE 148 (H) 01/21/2023 0408   BUN 8 01/21/2023 0408   CREATININE 0.55 (L) 01/21/2023 0408   CREATININE 0.90 03/31/2017 1625   CALCIUM  8.5 (L) 01/21/2023 0408   PROT 5.8 (L) 01/21/2023 0408   ALBUMIN  2.6 (L) 01/21/2023 0408   ALBUMIN  1.9 (L) 09/22/2021 1348   AST 18 01/21/2023 0408   ALT 88 (H) 01/21/2023 0408   ALKPHOS 160 (H) 01/21/2023 0408   BILITOT 0.7 01/21/2023 0408   GFRNONAA >60 01/21/2023 0408   GFRAA >90 04/07/2011 0927   Lab Results  Component Value Date   CHOL 117 04/29/2021   HDL 32 (L) 04/29/2021  LDLCALC 66 04/29/2021   LDLDIRECT 101.0 01/21/2016   TRIG 93 04/29/2021   CHOLHDL 3.7 04/29/2021   Lab Results  Component Value Date   HGBA1C 6.6 (H) 03/29/2022   Lab Results  Component Value Date   VITAMINB12 1,427 (H) 09/22/2021   Lab Results  Component Value Date   TSH 1.057 11/07/2021       ASSESSMENT AND PLAN  Gait disturbance - Plan: MR BRAIN W WO CONTRAST  Septic encephalopathy - Plan: MR BRAIN W WO CONTRAST  History of encephalopathy  History of multiple strokes  Urinary tract infection without hematuria, site unspecified   I believe the most likely cause of the encephalopathy that occurred in September 2023 was septic emboli.  ADEM or MS is less likely given the appearance of the lesions in the CSF results.  It would be unusual for MS to have dozens of simultaneously enhancing lesions.   March 2024 MRI did not show any new lesions and CSF was  normal.  The spinal cord has no lesions.   I do not recommend just beginning a disease modifying therapy at this time.   We will recheck an MRI of the brain with and without contrast.  If there are new changes consistent with MS I would reconsider and we could start Aubagio or another disease modifying therapy associated with low risk of infection.  If there are no additional significant changes, then the likelihood of MS becomes even lower He will continue physical therapy.  He is currently using a walker for short distances and has been mostly stable this year  Follow-up in 12 months I and family should call sooner if there are new or worsening neurologic symptoms.   Follow Up Instructions: I discussed the assessment and treatment plan with the patient. The patient was provided an opportunity to ask questions and all were answered. The patient agreed with the plan and demonstrated an understanding of the instructions.    The patient was advised to call back or seek an in-person evaluation if the symptoms worsen or if the condition fails to improve as anticipated.  I provided 20 minutes of non-face-to-face time during this encounter.  This visit is part of a comprehensive longitudinal care medical relationship regarding the patients primary diagnosis of encephalopathy and related concerns.    Sarahann Horrell A. Godwin Lat, MD, [facility] 06/09/2023, 10:22 PM Certified in Neurology, Clinical Neurophysiology, Sleep Medicine and Neuroimaging  [facility] [address], [address] [place], [place] [address] [phone]

## 2023-06-10 DIAGNOSIS — Z87442 Personal history of urinary calculi: Secondary | ICD-10-CM | POA: Diagnosis not present

## 2023-06-10 DIAGNOSIS — E785 Hyperlipidemia, unspecified: Secondary | ICD-10-CM | POA: Diagnosis not present

## 2023-06-10 DIAGNOSIS — I679 Cerebrovascular disease, unspecified: Secondary | ICD-10-CM | POA: Diagnosis not present

## 2023-06-10 DIAGNOSIS — E876 Hypokalemia: Secondary | ICD-10-CM | POA: Diagnosis not present

## 2023-06-10 DIAGNOSIS — Z9181 History of falling: Secondary | ICD-10-CM | POA: Diagnosis not present

## 2023-06-10 DIAGNOSIS — N3281 Overactive bladder: Secondary | ICD-10-CM | POA: Diagnosis not present

## 2023-06-10 DIAGNOSIS — N179 Acute kidney failure, unspecified: Secondary | ICD-10-CM | POA: Diagnosis not present

## 2023-06-10 DIAGNOSIS — R339 Retention of urine, unspecified: Secondary | ICD-10-CM | POA: Diagnosis not present

## 2023-06-10 DIAGNOSIS — G35 Multiple sclerosis: Secondary | ICD-10-CM | POA: Diagnosis not present

## 2023-06-10 DIAGNOSIS — D649 Anemia, unspecified: Secondary | ICD-10-CM | POA: Diagnosis not present

## 2023-06-10 DIAGNOSIS — Z8673 Personal history of transient ischemic attack (TIA), and cerebral infarction without residual deficits: Secondary | ICD-10-CM | POA: Diagnosis not present

## 2023-06-10 DIAGNOSIS — M1990 Unspecified osteoarthritis, unspecified site: Secondary | ICD-10-CM | POA: Diagnosis not present

## 2023-06-10 DIAGNOSIS — Z794 Long term (current) use of insulin: Secondary | ICD-10-CM | POA: Diagnosis not present

## 2023-06-10 DIAGNOSIS — Z436 Encounter for attention to other artificial openings of urinary tract: Secondary | ICD-10-CM | POA: Diagnosis not present

## 2023-06-10 DIAGNOSIS — Z6828 Body mass index (BMI) 28.0-28.9, adult: Secondary | ICD-10-CM | POA: Diagnosis not present

## 2023-06-10 DIAGNOSIS — I10 Essential (primary) hypertension: Secondary | ICD-10-CM | POA: Diagnosis not present

## 2023-06-10 DIAGNOSIS — E119 Type 2 diabetes mellitus without complications: Secondary | ICD-10-CM | POA: Diagnosis not present

## 2023-06-10 DIAGNOSIS — Z87440 Personal history of urinary (tract) infections: Secondary | ICD-10-CM | POA: Diagnosis not present

## 2023-06-10 DIAGNOSIS — R4189 Other symptoms and signs involving cognitive functions and awareness: Secondary | ICD-10-CM | POA: Diagnosis not present

## 2023-06-10 DIAGNOSIS — Z466 Encounter for fitting and adjustment of urinary device: Secondary | ICD-10-CM | POA: Diagnosis not present

## 2023-06-10 DIAGNOSIS — I471 Supraventricular tachycardia, unspecified: Secondary | ICD-10-CM | POA: Diagnosis not present

## 2023-06-14 ENCOUNTER — Telehealth: Payer: Self-pay | Admitting: Neurology

## 2023-06-14 NOTE — Telephone Encounter (Signed)
Sent to [place] to schedule [phone] 

## 2023-06-15 DIAGNOSIS — N3281 Overactive bladder: Secondary | ICD-10-CM | POA: Diagnosis not present

## 2023-06-15 DIAGNOSIS — E119 Type 2 diabetes mellitus without complications: Secondary | ICD-10-CM | POA: Diagnosis not present

## 2023-06-15 DIAGNOSIS — Z436 Encounter for attention to other artificial openings of urinary tract: Secondary | ICD-10-CM | POA: Diagnosis not present

## 2023-06-15 DIAGNOSIS — D649 Anemia, unspecified: Secondary | ICD-10-CM | POA: Diagnosis not present

## 2023-06-15 DIAGNOSIS — Z6828 Body mass index (BMI) 28.0-28.9, adult: Secondary | ICD-10-CM | POA: Diagnosis not present

## 2023-06-15 DIAGNOSIS — I10 Essential (primary) hypertension: Secondary | ICD-10-CM | POA: Diagnosis not present

## 2023-06-15 DIAGNOSIS — Z8673 Personal history of transient ischemic attack (TIA), and cerebral infarction without residual deficits: Secondary | ICD-10-CM | POA: Diagnosis not present

## 2023-06-15 DIAGNOSIS — Z9181 History of falling: Secondary | ICD-10-CM | POA: Diagnosis not present

## 2023-06-15 DIAGNOSIS — I471 Supraventricular tachycardia, unspecified: Secondary | ICD-10-CM | POA: Diagnosis not present

## 2023-06-15 DIAGNOSIS — R339 Retention of urine, unspecified: Secondary | ICD-10-CM | POA: Diagnosis not present

## 2023-06-15 DIAGNOSIS — R4189 Other symptoms and signs involving cognitive functions and awareness: Secondary | ICD-10-CM | POA: Diagnosis not present

## 2023-06-15 DIAGNOSIS — Z466 Encounter for fitting and adjustment of urinary device: Secondary | ICD-10-CM | POA: Diagnosis not present

## 2023-06-15 DIAGNOSIS — E785 Hyperlipidemia, unspecified: Secondary | ICD-10-CM | POA: Diagnosis not present

## 2023-06-15 DIAGNOSIS — Z87442 Personal history of urinary calculi: Secondary | ICD-10-CM | POA: Diagnosis not present

## 2023-06-15 DIAGNOSIS — M1990 Unspecified osteoarthritis, unspecified site: Secondary | ICD-10-CM | POA: Diagnosis not present

## 2023-06-15 DIAGNOSIS — N179 Acute kidney failure, unspecified: Secondary | ICD-10-CM | POA: Diagnosis not present

## 2023-06-15 DIAGNOSIS — G35 Multiple sclerosis: Secondary | ICD-10-CM | POA: Diagnosis not present

## 2023-06-15 DIAGNOSIS — Z794 Long term (current) use of insulin: Secondary | ICD-10-CM | POA: Diagnosis not present

## 2023-06-15 DIAGNOSIS — E876 Hypokalemia: Secondary | ICD-10-CM | POA: Diagnosis not present

## 2023-06-15 DIAGNOSIS — Z87440 Personal history of urinary (tract) infections: Secondary | ICD-10-CM | POA: Diagnosis not present

## 2023-06-15 DIAGNOSIS — I679 Cerebrovascular disease, unspecified: Secondary | ICD-10-CM | POA: Diagnosis not present

## 2023-06-15 NOTE — Telephone Encounter (Signed)
 [address] medicare [employer]gfried Dress: [id number] exp. 06/15/23-12/12/23 for Troy Randall. [phone]

## 2023-06-15 NOTE — Telephone Encounter (Signed)
 [employer] case #[id number] pending medical review.

## 2023-06-18 DIAGNOSIS — J302 Other seasonal allergic rhinitis: Secondary | ICD-10-CM | POA: Diagnosis not present

## 2023-06-18 DIAGNOSIS — Z789 Other specified health status: Secondary | ICD-10-CM | POA: Diagnosis not present

## 2023-06-18 DIAGNOSIS — K5909 Other constipation: Secondary | ICD-10-CM | POA: Diagnosis not present

## 2023-06-18 DIAGNOSIS — F331 Major depressive disorder, recurrent, moderate: Secondary | ICD-10-CM | POA: Diagnosis not present

## 2023-06-18 DIAGNOSIS — N401 Enlarged prostate with lower urinary tract symptoms: Secondary | ICD-10-CM | POA: Diagnosis not present

## 2023-06-18 DIAGNOSIS — G4700 Insomnia, unspecified: Secondary | ICD-10-CM | POA: Diagnosis not present

## 2023-06-18 DIAGNOSIS — I10 Essential (primary) hypertension: Secondary | ICD-10-CM | POA: Diagnosis not present

## 2023-06-18 DIAGNOSIS — Z7409 Other reduced mobility: Secondary | ICD-10-CM | POA: Diagnosis not present

## 2023-06-18 DIAGNOSIS — Z8673 Personal history of transient ischemic attack (TIA), and cerebral infarction without residual deficits: Secondary | ICD-10-CM | POA: Diagnosis not present

## 2023-06-18 DIAGNOSIS — K922 Gastrointestinal hemorrhage, unspecified: Secondary | ICD-10-CM | POA: Diagnosis not present

## 2023-06-18 DIAGNOSIS — E1165 Type 2 diabetes mellitus with hyperglycemia: Secondary | ICD-10-CM | POA: Diagnosis not present

## 2023-06-18 DIAGNOSIS — E782 Mixed hyperlipidemia: Secondary | ICD-10-CM | POA: Diagnosis not present

## 2023-06-21 DIAGNOSIS — Z87440 Personal history of urinary (tract) infections: Secondary | ICD-10-CM | POA: Diagnosis not present

## 2023-06-21 DIAGNOSIS — R399 Unspecified symptoms and signs involving the genitourinary system: Secondary | ICD-10-CM | POA: Diagnosis not present

## 2023-06-21 DIAGNOSIS — R509 Fever, unspecified: Secondary | ICD-10-CM | POA: Diagnosis not present

## 2023-06-21 DIAGNOSIS — Z978 Presence of other specified devices: Secondary | ICD-10-CM | POA: Diagnosis not present

## 2023-06-24 DIAGNOSIS — Z6828 Body mass index (BMI) 28.0-28.9, adult: Secondary | ICD-10-CM | POA: Diagnosis not present

## 2023-06-24 DIAGNOSIS — I679 Cerebrovascular disease, unspecified: Secondary | ICD-10-CM | POA: Diagnosis not present

## 2023-06-24 DIAGNOSIS — Z87442 Personal history of urinary calculi: Secondary | ICD-10-CM | POA: Diagnosis not present

## 2023-06-24 DIAGNOSIS — E119 Type 2 diabetes mellitus without complications: Secondary | ICD-10-CM | POA: Diagnosis not present

## 2023-06-24 DIAGNOSIS — I471 Supraventricular tachycardia, unspecified: Secondary | ICD-10-CM | POA: Diagnosis not present

## 2023-06-24 DIAGNOSIS — N179 Acute kidney failure, unspecified: Secondary | ICD-10-CM | POA: Diagnosis not present

## 2023-06-24 DIAGNOSIS — Z87440 Personal history of urinary (tract) infections: Secondary | ICD-10-CM | POA: Diagnosis not present

## 2023-06-24 DIAGNOSIS — Z8673 Personal history of transient ischemic attack (TIA), and cerebral infarction without residual deficits: Secondary | ICD-10-CM | POA: Diagnosis not present

## 2023-06-24 DIAGNOSIS — E785 Hyperlipidemia, unspecified: Secondary | ICD-10-CM | POA: Diagnosis not present

## 2023-06-24 DIAGNOSIS — D649 Anemia, unspecified: Secondary | ICD-10-CM | POA: Diagnosis not present

## 2023-06-24 DIAGNOSIS — E876 Hypokalemia: Secondary | ICD-10-CM | POA: Diagnosis not present

## 2023-06-24 DIAGNOSIS — N3281 Overactive bladder: Secondary | ICD-10-CM | POA: Diagnosis not present

## 2023-06-24 DIAGNOSIS — I10 Essential (primary) hypertension: Secondary | ICD-10-CM | POA: Diagnosis not present

## 2023-06-24 DIAGNOSIS — Z9181 History of falling: Secondary | ICD-10-CM | POA: Diagnosis not present

## 2023-06-24 DIAGNOSIS — Z466 Encounter for fitting and adjustment of urinary device: Secondary | ICD-10-CM | POA: Diagnosis not present

## 2023-06-24 DIAGNOSIS — R4189 Other symptoms and signs involving cognitive functions and awareness: Secondary | ICD-10-CM | POA: Diagnosis not present

## 2023-06-24 DIAGNOSIS — G35 Multiple sclerosis: Secondary | ICD-10-CM | POA: Diagnosis not present

## 2023-06-24 DIAGNOSIS — M1990 Unspecified osteoarthritis, unspecified site: Secondary | ICD-10-CM | POA: Diagnosis not present

## 2023-06-24 DIAGNOSIS — R339 Retention of urine, unspecified: Secondary | ICD-10-CM | POA: Diagnosis not present

## 2023-06-24 DIAGNOSIS — Z794 Long term (current) use of insulin: Secondary | ICD-10-CM | POA: Diagnosis not present

## 2023-06-24 DIAGNOSIS — Z436 Encounter for attention to other artificial openings of urinary tract: Secondary | ICD-10-CM | POA: Diagnosis not present

## 2023-07-02 ENCOUNTER — Ambulatory Visit (HOSPITAL_COMMUNITY)

## 2023-07-05 DIAGNOSIS — I10 Essential (primary) hypertension: Secondary | ICD-10-CM | POA: Diagnosis not present

## 2023-07-05 DIAGNOSIS — E1165 Type 2 diabetes mellitus with hyperglycemia: Secondary | ICD-10-CM | POA: Diagnosis not present

## 2023-07-05 DIAGNOSIS — I69320 Aphasia following cerebral infarction: Secondary | ICD-10-CM | POA: Diagnosis not present

## 2023-07-05 DIAGNOSIS — Z87440 Personal history of urinary (tract) infections: Secondary | ICD-10-CM | POA: Diagnosis not present

## 2023-07-08 DIAGNOSIS — E876 Hypokalemia: Secondary | ICD-10-CM | POA: Diagnosis not present

## 2023-07-08 DIAGNOSIS — I679 Cerebrovascular disease, unspecified: Secondary | ICD-10-CM | POA: Diagnosis not present

## 2023-07-08 DIAGNOSIS — R339 Retention of urine, unspecified: Secondary | ICD-10-CM | POA: Diagnosis not present

## 2023-07-08 DIAGNOSIS — M1990 Unspecified osteoarthritis, unspecified site: Secondary | ICD-10-CM | POA: Diagnosis not present

## 2023-07-08 DIAGNOSIS — E785 Hyperlipidemia, unspecified: Secondary | ICD-10-CM | POA: Diagnosis not present

## 2023-07-08 DIAGNOSIS — Z87442 Personal history of urinary calculi: Secondary | ICD-10-CM | POA: Diagnosis not present

## 2023-07-08 DIAGNOSIS — R4189 Other symptoms and signs involving cognitive functions and awareness: Secondary | ICD-10-CM | POA: Diagnosis not present

## 2023-07-08 DIAGNOSIS — Z6828 Body mass index (BMI) 28.0-28.9, adult: Secondary | ICD-10-CM | POA: Diagnosis not present

## 2023-07-08 DIAGNOSIS — E782 Mixed hyperlipidemia: Secondary | ICD-10-CM | POA: Diagnosis not present

## 2023-07-08 DIAGNOSIS — Z466 Encounter for fitting and adjustment of urinary device: Secondary | ICD-10-CM | POA: Diagnosis not present

## 2023-07-08 DIAGNOSIS — N179 Acute kidney failure, unspecified: Secondary | ICD-10-CM | POA: Diagnosis not present

## 2023-07-08 DIAGNOSIS — Z87440 Personal history of urinary (tract) infections: Secondary | ICD-10-CM | POA: Diagnosis not present

## 2023-07-08 DIAGNOSIS — G35 Multiple sclerosis: Secondary | ICD-10-CM | POA: Diagnosis not present

## 2023-07-08 DIAGNOSIS — Z9181 History of falling: Secondary | ICD-10-CM | POA: Diagnosis not present

## 2023-07-08 DIAGNOSIS — Z436 Encounter for attention to other artificial openings of urinary tract: Secondary | ICD-10-CM | POA: Diagnosis not present

## 2023-07-08 DIAGNOSIS — Z8673 Personal history of transient ischemic attack (TIA), and cerebral infarction without residual deficits: Secondary | ICD-10-CM | POA: Diagnosis not present

## 2023-07-08 DIAGNOSIS — Z794 Long term (current) use of insulin: Secondary | ICD-10-CM | POA: Diagnosis not present

## 2023-07-08 DIAGNOSIS — E119 Type 2 diabetes mellitus without complications: Secondary | ICD-10-CM | POA: Diagnosis not present

## 2023-07-08 DIAGNOSIS — I10 Essential (primary) hypertension: Secondary | ICD-10-CM | POA: Diagnosis not present

## 2023-07-08 DIAGNOSIS — I471 Supraventricular tachycardia, unspecified: Secondary | ICD-10-CM | POA: Diagnosis not present

## 2023-07-08 DIAGNOSIS — N3281 Overactive bladder: Secondary | ICD-10-CM | POA: Diagnosis not present

## 2023-07-08 DIAGNOSIS — D649 Anemia, unspecified: Secondary | ICD-10-CM | POA: Diagnosis not present

## 2023-07-16 DIAGNOSIS — I10 Essential (primary) hypertension: Secondary | ICD-10-CM | POA: Diagnosis not present

## 2023-07-16 DIAGNOSIS — Z789 Other specified health status: Secondary | ICD-10-CM | POA: Diagnosis not present

## 2023-07-16 DIAGNOSIS — K5909 Other constipation: Secondary | ICD-10-CM | POA: Diagnosis not present

## 2023-07-16 DIAGNOSIS — Z8673 Personal history of transient ischemic attack (TIA), and cerebral infarction without residual deficits: Secondary | ICD-10-CM | POA: Diagnosis not present

## 2023-07-16 DIAGNOSIS — Z7409 Other reduced mobility: Secondary | ICD-10-CM | POA: Diagnosis not present

## 2023-07-16 DIAGNOSIS — J302 Other seasonal allergic rhinitis: Secondary | ICD-10-CM | POA: Diagnosis not present

## 2023-07-16 DIAGNOSIS — G4700 Insomnia, unspecified: Secondary | ICD-10-CM | POA: Diagnosis not present

## 2023-07-16 DIAGNOSIS — K922 Gastrointestinal hemorrhage, unspecified: Secondary | ICD-10-CM | POA: Diagnosis not present

## 2023-07-16 DIAGNOSIS — N401 Enlarged prostate with lower urinary tract symptoms: Secondary | ICD-10-CM | POA: Diagnosis not present

## 2023-07-16 DIAGNOSIS — E782 Mixed hyperlipidemia: Secondary | ICD-10-CM | POA: Diagnosis not present

## 2023-07-16 DIAGNOSIS — F331 Major depressive disorder, recurrent, moderate: Secondary | ICD-10-CM | POA: Diagnosis not present

## 2023-07-16 DIAGNOSIS — E1165 Type 2 diabetes mellitus with hyperglycemia: Secondary | ICD-10-CM | POA: Diagnosis not present

## 2023-07-21 ENCOUNTER — Other Ambulatory Visit (HOSPITAL_COMMUNITY): Payer: Self-pay | Admitting: Student

## 2023-07-21 DIAGNOSIS — R339 Retention of urine, unspecified: Secondary | ICD-10-CM | POA: Diagnosis not present

## 2023-07-21 DIAGNOSIS — N179 Acute kidney failure, unspecified: Secondary | ICD-10-CM | POA: Diagnosis not present

## 2023-07-21 DIAGNOSIS — E876 Hypokalemia: Secondary | ICD-10-CM | POA: Diagnosis not present

## 2023-07-21 DIAGNOSIS — E119 Type 2 diabetes mellitus without complications: Secondary | ICD-10-CM | POA: Diagnosis not present

## 2023-07-21 DIAGNOSIS — Z466 Encounter for fitting and adjustment of urinary device: Secondary | ICD-10-CM | POA: Diagnosis not present

## 2023-07-21 DIAGNOSIS — Z9181 History of falling: Secondary | ICD-10-CM | POA: Diagnosis not present

## 2023-07-21 DIAGNOSIS — M1990 Unspecified osteoarthritis, unspecified site: Secondary | ICD-10-CM | POA: Diagnosis not present

## 2023-07-21 DIAGNOSIS — Z87440 Personal history of urinary (tract) infections: Secondary | ICD-10-CM | POA: Diagnosis not present

## 2023-07-21 DIAGNOSIS — I471 Supraventricular tachycardia, unspecified: Secondary | ICD-10-CM | POA: Diagnosis not present

## 2023-07-21 DIAGNOSIS — Z87442 Personal history of urinary calculi: Secondary | ICD-10-CM | POA: Diagnosis not present

## 2023-07-21 DIAGNOSIS — I679 Cerebrovascular disease, unspecified: Secondary | ICD-10-CM | POA: Diagnosis not present

## 2023-07-21 DIAGNOSIS — Z436 Encounter for attention to other artificial openings of urinary tract: Secondary | ICD-10-CM | POA: Diagnosis not present

## 2023-07-21 DIAGNOSIS — Z794 Long term (current) use of insulin: Secondary | ICD-10-CM | POA: Diagnosis not present

## 2023-07-21 DIAGNOSIS — R4189 Other symptoms and signs involving cognitive functions and awareness: Secondary | ICD-10-CM | POA: Diagnosis not present

## 2023-07-21 DIAGNOSIS — G35 Multiple sclerosis: Secondary | ICD-10-CM | POA: Diagnosis not present

## 2023-07-21 DIAGNOSIS — Z8673 Personal history of transient ischemic attack (TIA), and cerebral infarction without residual deficits: Secondary | ICD-10-CM | POA: Diagnosis not present

## 2023-07-21 DIAGNOSIS — E785 Hyperlipidemia, unspecified: Secondary | ICD-10-CM | POA: Diagnosis not present

## 2023-07-21 DIAGNOSIS — D649 Anemia, unspecified: Secondary | ICD-10-CM | POA: Diagnosis not present

## 2023-07-21 DIAGNOSIS — N3281 Overactive bladder: Secondary | ICD-10-CM | POA: Diagnosis not present

## 2023-07-21 DIAGNOSIS — I10 Essential (primary) hypertension: Secondary | ICD-10-CM | POA: Diagnosis not present

## 2023-07-21 DIAGNOSIS — Z6828 Body mass index (BMI) 28.0-28.9, adult: Secondary | ICD-10-CM | POA: Diagnosis not present

## 2023-07-21 DIAGNOSIS — N319 Neuromuscular dysfunction of bladder, unspecified: Secondary | ICD-10-CM

## 2023-07-21 NOTE — H&P (Signed)
 Chief Complaint: Patient was seen in consultation today for urinary retention  Referring Physician(s): Dahlstedt,Stephen  Supervising Physician: Johann Sieving  Patient Status: [facility] - Out-pt  History of Present Illness: Troy Randall is a 63 y.o. male with a medical history significant for CVA, DM, HTN, GI bleed, encephalitis, renal calculi and cholecystitis. He is familiar to IR from multiple procedures including mesenteric angiogram with embolization, percutaneous cholecystostomy and spyglass-assisted gallstone retrieval. His cholecystostomy drain was in place from September 2023 until May 2025.   He has been struggling with urinary retention for several years and has been foley catheter dependent. His catheter is changed every 4 weeks and he gets occasional urinary tract infections. His urology team has requested suprapubic catheter placement for long-term management of urinary retention.   Past Medical History:  Diagnosis Date   Anemia    Arthritis    CVA (cerebral vascular accident) (HCC)    last CVA 04/2021   Diabetes mellitus without complication (HCC)    type 2   Hyperlipidemia    Hypertension    Kidney stones     Past Surgical History:  Procedure Laterality Date   ANKLE SURGERY     BIOPSY  09/10/2021   Procedure: BIOPSY;  Surgeon: Rollin Dover, MD;  Location: [facility] ENDOSCOPY;  Service: Gastroenterology;;   BIOPSY  10/07/2021   Procedure: BIOPSY;  Surgeon: Rollin Dover, MD;  Location: [facility] ENDOSCOPY;  Service: Gastroenterology;;   COLONOSCOPY N/A 10/07/2021   Procedure: COLONOSCOPY;  Surgeon: Rollin Dover, MD;  Location: [facility] ENDOSCOPY;  Service: Gastroenterology;  Laterality: N/A;   COLONOSCOPY WITH PROPOFOL  N/A 09/06/2021   Procedure: COLONOSCOPY WITH PROPOFOL ;  Surgeon: Eartha Angelia Sieving, MD;  Location: [facility] ENDO SUITE;  Service: Gastroenterology;  Laterality: N/A;   CYSTOSCOPY/URETEROSCOPY/HOLMIUM LASER/STENT PLACEMENT Left 03/30/2022   Procedure:  CYSTOSCOPY/RETROGRADE/URETEROSCOPY/HOLMIUM LASER/STENT PLACEMENT;  Surgeon: Devere Lonni Righter, MD;  Location: [facility] ORS;  Service: Urology;  Laterality: Left;   ENTEROSCOPY N/A 09/10/2021   Procedure: ENTEROSCOPY;  Surgeon: Rollin Dover, MD;  Location: [facility] ENDOSCOPY;  Service: Gastroenterology;  Laterality: N/A;   ENTEROSCOPY  09/06/2021   Procedure: ENTEROSCOPY;  Surgeon: Eartha Angelia Sieving, MD;  Location: [facility] ENDO SUITE;  Service: Gastroenterology;;   ESOPHAGOGASTRODUODENOSCOPY (EGD) WITH PROPOFOL   09/06/2021   Procedure: ESOPHAGOGASTRODUODENOSCOPY (EGD) WITH PROPOFOL ;  Surgeon: Eartha Angelia Sieving, MD;  Location: [facility];  Service: Gastroenterology;;   ENID SIGMOIDOSCOPY N/A 09/19/2021   Procedure: ENID MORIN;  Surgeon: Rollin Dover, MD;  Location: [facility] ENDOSCOPY;  Service: Gastroenterology;  Laterality: N/A;   FOREIGN BODY REMOVAL  09/19/2021   Procedure: FOREIGN BODY REMOVAL;  Surgeon: Rollin Dover, MD;  Location: [facility] ENDOSCOPY;  Service: Gastroenterology;;   KRISTENE CAPSULE STUDY  09/06/2021   Procedure: GIVENS CAPSULE STUDY;  Surgeon: Eartha Angelia Sieving, MD;  [facility]: [facility]O [facility];  Service: Gastroenterology;;   HERNIA REPAIR     umbilical x1 Incisional x1   INCISIONAL HERNIA REPAIR  04/13/2011   Procedure: LAPAROSCOPIC INCISIONAL HERNIA;  Surgeon: Oneil DELENA Budge, MD;  Location: [facility] ORS;  Service: General;  Laterality: N/A;  Recurrent Laparoscopic Incisional Herniorraphy with Mesh   IR ANGIOGRAM SELECTIVE EACH ADDITIONAL VESSEL  09/09/2021   IR ANGIOGRAM VISCERAL SELECTIVE  09/07/2021   IR ANGIOGRAM VISCERAL SELECTIVE  09/06/2021   IR ANGIOGRAM VISCERAL SELECTIVE  09/06/2021   IR CHOLANGIOGRAM EXISTING TUBE  11/06/2021   IR CHOLANGIOGRAM EXISTING TUBE  02/12/2023   IR CHOLANGIOGRAM EXISTING TUBE  05/28/2023   IR EMBO ART  VEN HEMORR LYMPH EXTRAV  INC GUIDE ROADMAPPING  09/06/2021   IR EXCHANGE BILIARY DRAIN  10/24/2021   IR EXCHANGE BILIARY DRAIN  11/19/2021   IR  EXCHANGE BILIARY DRAIN  01/19/2022   IR EXCHANGE BILIARY DRAIN  02/19/2022   IR EXCHANGE BILIARY DRAIN  04/01/2022   IR EXCHANGE BILIARY DRAIN  05/27/2022   IR EXCHANGE BILIARY DRAIN  07/24/2022   IR EXCHANGE BILIARY DRAIN  09/24/2022   IR EXCHANGE BILIARY DRAIN  11/19/2022   IR EXCHANGE BILIARY DRAIN  01/18/2023   IR EXCHANGE BILIARY DRAIN  01/19/2023   IR EXCHANGE BILIARY DRAIN  04/16/2023   IR GUIDED DRAIN W CATHETER PLACEMENT  09/07/2021   IR REMOVAL OF CALCULI/DEBRIS BILIARY DUCT/GB  01/19/2023   IR US  GUIDE BX ASP/DRAIN  09/07/2021   IR US  GUIDE VASC ACCESS RIGHT  09/07/2021   IR US  GUIDE VASC ACCESS RIGHT  09/06/2021   KIDNEY STONE SURGERY     RADIOLOGY WITH ANESTHESIA N/A 03/10/2022   Procedure: MRI WITH ANESTHESIA OF BRAIN WITH AND WITHOUT CONTRAST;  Surgeon: Radiologist, Medication, MD;  Location: [facility] OR;  Service: Radiology;  Laterality: N/A;    Allergies: Bactrim  [sulfamethoxazole -trimethoprim ] and Amoxicillin   Medications: Prior to Admission medications   Medication Sig Start Date End Date Taking? Authorizing Provider  Ascorbic Acid (VITAMIN C) 500 MG CHEW Chew 1 tablet by mouth daily.    [provider]  BD VEO INSULIN  SYRINGE U/F 31G X 15/64 1 ML MISC  USE AS DIRECTED 06/08/17   Rolinda Millman, MD  diphenhydrAMINE  (BENADRYL  ALLERGY) 25 MG tablet Take 25 mg by mouth every 6 (six) hours as needed for allergies.    [provider]  glucose blood (ONETOUCH VERIO) test strip TEST twice a day 05/04/17   Rolinda Millman, MD  Insulin  Pen Needle (NOVOTWIST) 32G X 5 MM MISC Use two daily to inject Victoza  and Toujeo . 04/25/15   Von Pacific, MD  metoprolol  succinate (TOPROL -XL) 25 MG 24 hr tablet Take 0.5 tablets (12.5 mg total) by mouth daily. 04/03/22   Samtani, Jai-Gurmukh, MD  [facility] DELICA LANCETS 33G MISC Use to check blood sugar once a day dx code E11.65 11/21/14   Von Pacific, MD  oxybutynin  (DITROPAN ) 5 MG tablet Take 1 tablet (5 mg total) by mouth every 8 (eight) hours as  needed for bladder spasms. 03/30/23   Matilda Senior, MD  pantoprazole  (PROTONIX ) 40 MG tablet Take 1 tablet (40 mg total) by mouth daily. Patient taking differently: Take 40 mg by mouth daily before breakfast. 11/27/21   Setzer, Sandra J, PA-C  potassium chloride  (KLOR-CON ) 10 MEQ tablet Take 10 mEq by mouth daily. Patient not taking: Reported on 01/17/2023    [provider]  Probiotic Product (PROBIOTIC PO) Take 1 tablet by mouth daily.    [provider]  rosuvastatin  (CRESTOR ) 40 MG tablet Take 1 tablet (40 mg total) by mouth daily. 04/29/21   Lue Elsie BROCKS, MD  traZODone  (DESYREL ) 50 MG tablet Take 0.5-1 tablets (25-50 mg total) by mouth at bedtime as needed for sleep. Patient taking differently: Take 50 mg by mouth at bedtime. 11/26/21   Setzer, Sandra J, PA-C  TRESIBA  FLEXTOUCH 200 UNIT/ML FlexTouch Pen Inject under skin 15 units twice a day. Patient taking differently: Inject 18 Units into the skin See admin instructions. Inject 18 units into the skin daily- BGL must be 130 or greater 11/26/21   Setzer, Sandra J, PA-C  TYLENOL  500 MG tablet Take 1 tablet (500 mg total) by mouth every 6 (six)  hours as needed for mild pain or headache. 04/03/22   Samtani, Jai-Gurmukh, MD     Family History  Problem Relation Age of Onset   Heart failure Mother    Diabetes Father    Cancer Other    Heart attack Other    Anesthesia problems Neg Hx    Hypotension Neg Hx    Malignant hyperthermia Neg Hx    Pseudochol deficiency Neg Hx    Colon cancer Neg Hx    Inflammatory bowel disease Neg Hx     Social History   Socioeconomic History   Marital status: Married    Spouse name: Not on file   Number of children: Not on file   Years of education: Not on file   Highest education level: Not on file  Occupational History   Not on file  Tobacco Use   Smoking status: Never   Smokeless tobacco: Former    Types: Snuff  Vaping Use   Vaping status: Never Used  Substance and  Sexual Activity   Alcohol use: No   Drug use: No   Sexual activity: Yes    Birth control/protection: None  Other Topics Concern   Not on file  Social History Narrative   Live in [place], [place].   Married for over 30 years.   Has 2 children, age 22/30s. Has one grandchild, 19 months.    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    Eats all food groups.   Wears seatbelt.    Dip 1 can a day.    Social Drivers of Corporate investment banker Strain: Not on file  Food Insecurity: No Food Insecurity (02/04/2023)   Hunger Vital Sign    Worried About Running Out of Food in the Last Year: Never true    Ran Out of Food in the Last Year: Never true  Transportation Needs: No Transportation Needs (02/04/2023)   PRAPARE - Administrator, Civil Service (Medical): No    Lack of Transportation (Non-Medical): No  Physical Activity: Not on file  Stress: Not on file  Social Connections: Moderately Integrated (01/22/2023)   Social Connection and Isolation Panel    Frequency of Communication with Friends and Family: More than three times a week    Frequency of Social Gatherings with Friends and Family: More than three times a week    Attends Religious Services: Never    Database administrator or Organizations: Yes    Attends Banker Meetings: 1 to 4 times per year    Marital Status: Married    Review of Systems: A 12 point ROS discussed and pertinent positives are indicated in the HPI above.  All other systems are negative.  Review of Systems  Vital Signs: There were no vitals taken for this visit.  Physical Exam  Imaging: No results found.  Labs:  CBC: Recent Labs    01/18/23 0405 01/19/23 0402 01/20/23 0413 01/21/23 0408  WBC 8.5 5.2 6.5 5.5  HGB 11.2* 11.9* 11.8* 11.7*  HCT 33.0* 34.5* 34.5* 34.4*  PLT 117* 119* 120* 142*    COAGS: Recent Labs    01/19/23 0932  INR 1.0    BMP: Recent Labs    01/18/23 0405  01/19/23 0402 01/20/23 0413 01/21/23 0408  NA 136 138 137 136  K 2.9* 3.9 3.5 2.8*  CL 102 105 106 106  CO2 25 26 23 23   GLUCOSE 127* 139* 125*  148*  BUN 10 8 9 8   CALCIUM  8.1* 8.6* 8.6* 8.5*  CREATININE 0.78 0.62 0.62 0.55*  GFRNONAA >60 >60 >60 >60    LIVER FUNCTION TESTS: Recent Labs    01/18/23 0405 01/19/23 0402 01/20/23 0413 01/21/23 0408  BILITOT 1.8* 2.9* 1.4* 0.7  AST 177* 67* 30 18  ALT 288* 190* 120* 88*  ALKPHOS 230* 229* 196* 160*  PROT 5.8* 6.0* 5.7* 5.8*  ALBUMIN  2.7* 2.7* 2.6* 2.6*    TUMOR MARKERS: No results for input(s): AFPTM, CEA, CA199, CHROMGRNA in the last 8760 hours.  Assessment and Plan:  Urinary retention; foley catheter dependent: Robby L. Georgina, 63 year old male, presents today to the [facility] Interventional Radiology department for an image-guided suprapubic catheter placement.   Risks and benefits discussed with the patient including bleeding, infection, damage to adjacent structures, bowel perforation/fistula connection, and sepsis.  All of the patient's questions were answered, patient is agreeable to proceed. He has been NPO. He is a full code. He does not take any blood-thinning medications.   Consent signed and in chart.  Thank you for this interesting consult.  I greatly enjoyed meeting IVO MOGA and look forward to participating in their care.  A copy of this report was sent to the requesting provider on this date.  Electronically Signed: Warren Dais, AGACNP-BC 07/21/2023, 1:15 PM   I spent a total of  30 Minutes   in face to face in clinical consultation, greater than 50% of which was counseling/coordinating care for urinary retention.

## 2023-07-23 ENCOUNTER — Other Ambulatory Visit: Payer: Self-pay

## 2023-07-23 ENCOUNTER — Ambulatory Visit (HOSPITAL_COMMUNITY)
Admission: RE | Admit: 2023-07-23 | Discharge: 2023-07-23 | Disposition: A | Discharge status: Home / Self Care | Source: Ambulatory Visit | Attending: Urology | Admitting: Urology

## 2023-07-23 DIAGNOSIS — R339 Retention of urine, unspecified: Secondary | ICD-10-CM | POA: Insufficient documentation

## 2023-07-23 DIAGNOSIS — N319 Neuromuscular dysfunction of bladder, unspecified: Secondary | ICD-10-CM

## 2023-07-23 DIAGNOSIS — Z87891 Personal history of nicotine dependence: Secondary | ICD-10-CM | POA: Insufficient documentation

## 2023-07-23 HISTORY — PX: IR CYSTOSTOMY TUBE PLACEMENT/BLADDER ASPIRATION: IMG1097

## 2023-07-23 LAB — CBC
HCT: 40.5 % (ref 39.0–52.0)
Hemoglobin: 13.8 g/dL (ref 13.0–17.0)
MCH: 28.3 pg (ref 26.0–34.0)
MCHC: 34.1 g/dL (ref 30.0–36.0)
MCV: 83.2 fL (ref 80.0–100.0)
Platelets: 152 K/uL (ref 150–400)
RBC: 4.87 MIL/uL (ref 4.22–5.81)
RDW: 13.4 % (ref 11.5–15.5)
WBC: 6.1 K/uL (ref 4.0–10.5)
nRBC: 0 % (ref 0.0–0.2)

## 2023-07-23 LAB — BASIC METABOLIC PANEL WITH GFR
Anion gap: 9 (ref 5–15)
BUN: 9 mg/dL (ref 6–20)
CO2: 26 mmol/L (ref 22–32)
Calcium: 9.1 mg/dL (ref 8.9–10.3)
Chloride: 100 mmol/L (ref 98–111)
Creatinine, Ser: 0.76 mg/dL (ref 0.61–1.24)
GFR, Estimated: >60 mL/min (ref >60)
Glucose, Bld: 153 mg/dL — ABNORMAL HIGH (ref 70–99)
Potassium: 3.1 mmol/L — ABNORMAL LOW (ref 3.5–5.1)
Sodium: 135 mmol/L (ref 135–145)

## 2023-07-23 LAB — GLUCOSE, CAPILLARY: Glucose-Capillary: 157 mg/dL — ABNORMAL HIGH (ref 70–99)

## 2023-07-23 LAB — PROTIME-INR
INR: 1 (ref 0.8–1.2)
Prothrombin Time: 13.9 s (ref 11.4–15.2)

## 2023-07-23 MED ORDER — DIPHENHYDRAMINE HCL 50 MG/ML IJ SOLN
INTRAMUSCULAR | Status: AC | PRN
  Administered 2023-07-23: 25 mg via INTRAVENOUS

## 2023-07-23 MED ORDER — LIDOCAINE-EPINEPHRINE 1 %-1:100000 IJ SOLN
20.0000 mL | Freq: Once | INTRAMUSCULAR | Status: AC
  Administered 2023-07-23: 10 mL via INTRADERMAL

## 2023-07-23 MED ORDER — FENTANYL CITRATE (PF) 100 MCG/2ML IJ SOLN
INTRAMUSCULAR | Status: AC | PRN
  Administered 2023-07-23: 50 ug via INTRAVENOUS
  Administered 2023-07-23: 25 ug via INTRAVENOUS

## 2023-07-23 MED ORDER — MIDAZOLAM HCL 2 MG/2ML IJ SOLN
INTRAMUSCULAR | Status: AC
  Filled 2023-07-23: qty 2

## 2023-07-23 MED ORDER — IOHEXOL 300 MG/ML  SOLN
50.0000 mL | Freq: Once | INTRAMUSCULAR | Status: AC | PRN
  Administered 2023-07-23: 15 mL

## 2023-07-23 MED ORDER — DIPHENHYDRAMINE HCL 50 MG/ML IJ SOLN
INTRAMUSCULAR | Status: AC
  Filled 2023-07-23: qty 1

## 2023-07-23 MED ORDER — SODIUM CHLORIDE 0.9 % IV SOLN: INTRAVENOUS | Status: DC

## 2023-07-23 MED ORDER — LIDOCAINE-EPINEPHRINE 1 %-1:100000 IJ SOLN
INTRAMUSCULAR | Status: AC
  Filled 2023-07-23: qty 1

## 2023-07-23 MED ORDER — SODIUM CHLORIDE 0.9% FLUSH: 5.0000 mL | Freq: Three times a day (TID) | INTRAVENOUS | Status: DC

## 2023-07-23 MED ORDER — LIDOCAINE VISCOUS HCL 2 % MT SOLN
OROMUCOSAL | Status: AC
  Filled 2023-07-23: qty 15

## 2023-07-23 MED ORDER — MIDAZOLAM HCL 2 MG/2ML IJ SOLN
INTRAMUSCULAR | Status: AC | PRN
  Administered 2023-07-23: 1 mg via INTRAVENOUS

## 2023-07-23 MED ORDER — FENTANYL CITRATE (PF) 100 MCG/2ML IJ SOLN
INTRAMUSCULAR | Status: AC
  Filled 2023-07-23: qty 2

## 2023-07-23 NOTE — Procedures (Signed)
  Procedure:  US /fluoro guided suprapubic urinary bladder catheter placement 43F Preprocedure diagnosis: Diagnoses of Urinary retention and Neurogenic bladder were pertinent to this visit. Postprocedure diagnosis: same EBL:    minimal Complications:   none immediate  See full dictation in [employer].  CHARM Toribio Faes MD Main # [phone] Pager  [phone] Mobile [phone]

## 2023-07-26 ENCOUNTER — Telehealth: Payer: Self-pay | Admitting: Urology

## 2023-07-26 NOTE — Telephone Encounter (Signed)
 Pt's wife was made aware and voiced understanding. [facility] was coming out to changing foley cath but now need new order for [facility] to be able to change SP tube catheter. Wife is aware message will be sent to provider.

## 2023-07-26 NOTE — Telephone Encounter (Signed)
 Wife called and informed patient got SP cath and she thinks they will need new orders for home health care to change cath monthly.

## 2023-07-27 NOTE — Telephone Encounter (Signed)
 Pt's wife is made aware and voiced understanding Patient just had his suprapubic tube placed a week ago. It should be changed for the first time in this office. I would like to see him in 6 weeks for this change. Then we can have home health care to do it monthly after that. It is necessary to leave the first catheter in for quite a few weeks for the tract to form. Please make the appointment for him. At that time, I will provide them with an order for home health care.

## 2023-08-05 DIAGNOSIS — I10 Essential (primary) hypertension: Secondary | ICD-10-CM | POA: Diagnosis not present

## 2023-08-05 DIAGNOSIS — E1169 Type 2 diabetes mellitus with other specified complication: Secondary | ICD-10-CM | POA: Diagnosis not present

## 2023-08-05 DIAGNOSIS — E782 Mixed hyperlipidemia: Secondary | ICD-10-CM | POA: Diagnosis not present

## 2023-08-05 DIAGNOSIS — N401 Enlarged prostate with lower urinary tract symptoms: Secondary | ICD-10-CM | POA: Diagnosis not present

## 2023-08-06 DIAGNOSIS — D649 Anemia, unspecified: Secondary | ICD-10-CM | POA: Diagnosis not present

## 2023-08-06 DIAGNOSIS — Z6828 Body mass index (BMI) 28.0-28.9, adult: Secondary | ICD-10-CM | POA: Diagnosis not present

## 2023-08-06 DIAGNOSIS — N179 Acute kidney failure, unspecified: Secondary | ICD-10-CM | POA: Diagnosis not present

## 2023-08-06 DIAGNOSIS — K5909 Other constipation: Secondary | ICD-10-CM | POA: Diagnosis not present

## 2023-08-06 DIAGNOSIS — E785 Hyperlipidemia, unspecified: Secondary | ICD-10-CM | POA: Diagnosis not present

## 2023-08-06 DIAGNOSIS — Z87440 Personal history of urinary (tract) infections: Secondary | ICD-10-CM | POA: Diagnosis not present

## 2023-08-06 DIAGNOSIS — Z436 Encounter for attention to other artificial openings of urinary tract: Secondary | ICD-10-CM | POA: Diagnosis not present

## 2023-08-06 DIAGNOSIS — G4700 Insomnia, unspecified: Secondary | ICD-10-CM | POA: Diagnosis not present

## 2023-08-06 DIAGNOSIS — N401 Enlarged prostate with lower urinary tract symptoms: Secondary | ICD-10-CM | POA: Diagnosis not present

## 2023-08-06 DIAGNOSIS — R339 Retention of urine, unspecified: Secondary | ICD-10-CM | POA: Diagnosis not present

## 2023-08-06 DIAGNOSIS — G35 Multiple sclerosis: Secondary | ICD-10-CM | POA: Diagnosis not present

## 2023-08-06 DIAGNOSIS — I679 Cerebrovascular disease, unspecified: Secondary | ICD-10-CM | POA: Diagnosis not present

## 2023-08-06 DIAGNOSIS — Z794 Long term (current) use of insulin: Secondary | ICD-10-CM | POA: Diagnosis not present

## 2023-08-06 DIAGNOSIS — J302 Other seasonal allergic rhinitis: Secondary | ICD-10-CM | POA: Diagnosis not present

## 2023-08-06 DIAGNOSIS — E876 Hypokalemia: Secondary | ICD-10-CM | POA: Diagnosis not present

## 2023-08-06 DIAGNOSIS — F331 Major depressive disorder, recurrent, moderate: Secondary | ICD-10-CM | POA: Diagnosis not present

## 2023-08-06 DIAGNOSIS — E782 Mixed hyperlipidemia: Secondary | ICD-10-CM | POA: Diagnosis not present

## 2023-08-06 DIAGNOSIS — M1990 Unspecified osteoarthritis, unspecified site: Secondary | ICD-10-CM | POA: Diagnosis not present

## 2023-08-06 DIAGNOSIS — E1165 Type 2 diabetes mellitus with hyperglycemia: Secondary | ICD-10-CM | POA: Diagnosis not present

## 2023-08-06 DIAGNOSIS — I10 Essential (primary) hypertension: Secondary | ICD-10-CM | POA: Diagnosis not present

## 2023-08-06 DIAGNOSIS — E119 Type 2 diabetes mellitus without complications: Secondary | ICD-10-CM | POA: Diagnosis not present

## 2023-08-06 DIAGNOSIS — Z9181 History of falling: Secondary | ICD-10-CM | POA: Diagnosis not present

## 2023-08-06 DIAGNOSIS — R4189 Other symptoms and signs involving cognitive functions and awareness: Secondary | ICD-10-CM | POA: Diagnosis not present

## 2023-08-06 DIAGNOSIS — K922 Gastrointestinal hemorrhage, unspecified: Secondary | ICD-10-CM | POA: Diagnosis not present

## 2023-08-06 DIAGNOSIS — Z7409 Other reduced mobility: Secondary | ICD-10-CM | POA: Diagnosis not present

## 2023-08-06 DIAGNOSIS — Z8673 Personal history of transient ischemic attack (TIA), and cerebral infarction without residual deficits: Secondary | ICD-10-CM | POA: Diagnosis not present

## 2023-08-06 DIAGNOSIS — Z87442 Personal history of urinary calculi: Secondary | ICD-10-CM | POA: Diagnosis not present

## 2023-08-06 DIAGNOSIS — I471 Supraventricular tachycardia, unspecified: Secondary | ICD-10-CM | POA: Diagnosis not present

## 2023-08-06 DIAGNOSIS — N3281 Overactive bladder: Secondary | ICD-10-CM | POA: Diagnosis not present

## 2023-08-06 DIAGNOSIS — Z789 Other specified health status: Secondary | ICD-10-CM | POA: Diagnosis not present

## 2023-08-06 DIAGNOSIS — Z466 Encounter for fitting and adjustment of urinary device: Secondary | ICD-10-CM | POA: Diagnosis not present

## 2023-08-12 DIAGNOSIS — M1990 Unspecified osteoarthritis, unspecified site: Secondary | ICD-10-CM | POA: Diagnosis not present

## 2023-08-12 DIAGNOSIS — Z87442 Personal history of urinary calculi: Secondary | ICD-10-CM | POA: Diagnosis not present

## 2023-08-12 DIAGNOSIS — Z794 Long term (current) use of insulin: Secondary | ICD-10-CM | POA: Diagnosis not present

## 2023-08-12 DIAGNOSIS — I679 Cerebrovascular disease, unspecified: Secondary | ICD-10-CM | POA: Diagnosis not present

## 2023-08-12 DIAGNOSIS — R4189 Other symptoms and signs involving cognitive functions and awareness: Secondary | ICD-10-CM | POA: Diagnosis not present

## 2023-08-12 DIAGNOSIS — G35 Multiple sclerosis: Secondary | ICD-10-CM | POA: Diagnosis not present

## 2023-08-12 DIAGNOSIS — Z9181 History of falling: Secondary | ICD-10-CM | POA: Diagnosis not present

## 2023-08-12 DIAGNOSIS — Z436 Encounter for attention to other artificial openings of urinary tract: Secondary | ICD-10-CM | POA: Diagnosis not present

## 2023-08-12 DIAGNOSIS — E782 Mixed hyperlipidemia: Secondary | ICD-10-CM | POA: Diagnosis not present

## 2023-08-12 DIAGNOSIS — R339 Retention of urine, unspecified: Secondary | ICD-10-CM | POA: Diagnosis not present

## 2023-08-12 DIAGNOSIS — Z8673 Personal history of transient ischemic attack (TIA), and cerebral infarction without residual deficits: Secondary | ICD-10-CM | POA: Diagnosis not present

## 2023-08-12 DIAGNOSIS — Z87440 Personal history of urinary (tract) infections: Secondary | ICD-10-CM | POA: Diagnosis not present

## 2023-08-12 DIAGNOSIS — N3281 Overactive bladder: Secondary | ICD-10-CM | POA: Diagnosis not present

## 2023-08-12 DIAGNOSIS — Z466 Encounter for fitting and adjustment of urinary device: Secondary | ICD-10-CM | POA: Diagnosis not present

## 2023-08-12 DIAGNOSIS — E119 Type 2 diabetes mellitus without complications: Secondary | ICD-10-CM | POA: Diagnosis not present

## 2023-08-12 DIAGNOSIS — N179 Acute kidney failure, unspecified: Secondary | ICD-10-CM | POA: Diagnosis not present

## 2023-08-12 DIAGNOSIS — I471 Supraventricular tachycardia, unspecified: Secondary | ICD-10-CM | POA: Diagnosis not present

## 2023-08-12 DIAGNOSIS — E876 Hypokalemia: Secondary | ICD-10-CM | POA: Diagnosis not present

## 2023-08-12 DIAGNOSIS — Z6828 Body mass index (BMI) 28.0-28.9, adult: Secondary | ICD-10-CM | POA: Diagnosis not present

## 2023-08-12 DIAGNOSIS — I10 Essential (primary) hypertension: Secondary | ICD-10-CM | POA: Diagnosis not present

## 2023-08-12 DIAGNOSIS — E785 Hyperlipidemia, unspecified: Secondary | ICD-10-CM | POA: Diagnosis not present

## 2023-08-12 DIAGNOSIS — D649 Anemia, unspecified: Secondary | ICD-10-CM | POA: Diagnosis not present

## 2023-08-30 DIAGNOSIS — Z9359 Other cystostomy status: Secondary | ICD-10-CM | POA: Diagnosis not present

## 2023-08-30 DIAGNOSIS — N390 Urinary tract infection, site not specified: Secondary | ICD-10-CM | POA: Diagnosis not present

## 2023-09-02 DIAGNOSIS — I471 Supraventricular tachycardia, unspecified: Secondary | ICD-10-CM | POA: Diagnosis not present

## 2023-09-02 DIAGNOSIS — N179 Acute kidney failure, unspecified: Secondary | ICD-10-CM | POA: Diagnosis not present

## 2023-09-02 DIAGNOSIS — Z436 Encounter for attention to other artificial openings of urinary tract: Secondary | ICD-10-CM | POA: Diagnosis not present

## 2023-09-02 DIAGNOSIS — Z87442 Personal history of urinary calculi: Secondary | ICD-10-CM | POA: Diagnosis not present

## 2023-09-02 DIAGNOSIS — Z6828 Body mass index (BMI) 28.0-28.9, adult: Secondary | ICD-10-CM | POA: Diagnosis not present

## 2023-09-02 DIAGNOSIS — E785 Hyperlipidemia, unspecified: Secondary | ICD-10-CM | POA: Diagnosis not present

## 2023-09-02 DIAGNOSIS — E782 Mixed hyperlipidemia: Secondary | ICD-10-CM | POA: Diagnosis not present

## 2023-09-02 DIAGNOSIS — M1990 Unspecified osteoarthritis, unspecified site: Secondary | ICD-10-CM | POA: Diagnosis not present

## 2023-09-02 DIAGNOSIS — D649 Anemia, unspecified: Secondary | ICD-10-CM | POA: Diagnosis not present

## 2023-09-02 DIAGNOSIS — I679 Cerebrovascular disease, unspecified: Secondary | ICD-10-CM | POA: Diagnosis not present

## 2023-09-02 DIAGNOSIS — Z8673 Personal history of transient ischemic attack (TIA), and cerebral infarction without residual deficits: Secondary | ICD-10-CM | POA: Diagnosis not present

## 2023-09-02 DIAGNOSIS — R4189 Other symptoms and signs involving cognitive functions and awareness: Secondary | ICD-10-CM | POA: Diagnosis not present

## 2023-09-02 DIAGNOSIS — E119 Type 2 diabetes mellitus without complications: Secondary | ICD-10-CM | POA: Diagnosis not present

## 2023-09-02 DIAGNOSIS — G35 Multiple sclerosis: Secondary | ICD-10-CM | POA: Diagnosis not present

## 2023-09-02 DIAGNOSIS — Z87440 Personal history of urinary (tract) infections: Secondary | ICD-10-CM | POA: Diagnosis not present

## 2023-09-02 DIAGNOSIS — Z794 Long term (current) use of insulin: Secondary | ICD-10-CM | POA: Diagnosis not present

## 2023-09-02 DIAGNOSIS — N401 Enlarged prostate with lower urinary tract symptoms: Secondary | ICD-10-CM | POA: Diagnosis not present

## 2023-09-02 DIAGNOSIS — E876 Hypokalemia: Secondary | ICD-10-CM | POA: Diagnosis not present

## 2023-09-02 DIAGNOSIS — E1165 Type 2 diabetes mellitus with hyperglycemia: Secondary | ICD-10-CM | POA: Diagnosis not present

## 2023-09-02 DIAGNOSIS — N3281 Overactive bladder: Secondary | ICD-10-CM | POA: Diagnosis not present

## 2023-09-02 DIAGNOSIS — R339 Retention of urine, unspecified: Secondary | ICD-10-CM | POA: Diagnosis not present

## 2023-09-02 DIAGNOSIS — Z9181 History of falling: Secondary | ICD-10-CM | POA: Diagnosis not present

## 2023-09-02 DIAGNOSIS — I10 Essential (primary) hypertension: Secondary | ICD-10-CM | POA: Diagnosis not present

## 2023-09-02 DIAGNOSIS — Z466 Encounter for fitting and adjustment of urinary device: Secondary | ICD-10-CM | POA: Diagnosis not present

## 2023-09-03 DIAGNOSIS — E1165 Type 2 diabetes mellitus with hyperglycemia: Secondary | ICD-10-CM | POA: Diagnosis not present

## 2023-09-03 DIAGNOSIS — I10 Essential (primary) hypertension: Secondary | ICD-10-CM | POA: Diagnosis not present

## 2023-09-03 DIAGNOSIS — K922 Gastrointestinal hemorrhage, unspecified: Secondary | ICD-10-CM | POA: Diagnosis not present

## 2023-09-03 DIAGNOSIS — N401 Enlarged prostate with lower urinary tract symptoms: Secondary | ICD-10-CM | POA: Diagnosis not present

## 2023-09-03 DIAGNOSIS — E782 Mixed hyperlipidemia: Secondary | ICD-10-CM | POA: Diagnosis not present

## 2023-09-03 DIAGNOSIS — K5909 Other constipation: Secondary | ICD-10-CM | POA: Diagnosis not present

## 2023-09-03 DIAGNOSIS — Z789 Other specified health status: Secondary | ICD-10-CM | POA: Diagnosis not present

## 2023-09-03 DIAGNOSIS — G4700 Insomnia, unspecified: Secondary | ICD-10-CM | POA: Diagnosis not present

## 2023-09-03 DIAGNOSIS — J302 Other seasonal allergic rhinitis: Secondary | ICD-10-CM | POA: Diagnosis not present

## 2023-09-03 DIAGNOSIS — F331 Major depressive disorder, recurrent, moderate: Secondary | ICD-10-CM | POA: Diagnosis not present

## 2023-09-03 DIAGNOSIS — Z7409 Other reduced mobility: Secondary | ICD-10-CM | POA: Diagnosis not present

## 2023-09-03 DIAGNOSIS — Z9359 Other cystostomy status: Secondary | ICD-10-CM | POA: Diagnosis not present

## 2023-09-08 DIAGNOSIS — I10 Essential (primary) hypertension: Secondary | ICD-10-CM | POA: Diagnosis not present

## 2023-09-08 DIAGNOSIS — D649 Anemia, unspecified: Secondary | ICD-10-CM | POA: Diagnosis not present

## 2023-09-08 DIAGNOSIS — Z9181 History of falling: Secondary | ICD-10-CM | POA: Diagnosis not present

## 2023-09-08 DIAGNOSIS — Z466 Encounter for fitting and adjustment of urinary device: Secondary | ICD-10-CM | POA: Diagnosis not present

## 2023-09-08 DIAGNOSIS — G35 Multiple sclerosis: Secondary | ICD-10-CM | POA: Diagnosis not present

## 2023-09-08 DIAGNOSIS — Z6828 Body mass index (BMI) 28.0-28.9, adult: Secondary | ICD-10-CM | POA: Diagnosis not present

## 2023-09-08 DIAGNOSIS — M1990 Unspecified osteoarthritis, unspecified site: Secondary | ICD-10-CM | POA: Diagnosis not present

## 2023-09-08 DIAGNOSIS — I679 Cerebrovascular disease, unspecified: Secondary | ICD-10-CM | POA: Diagnosis not present

## 2023-09-08 DIAGNOSIS — N3281 Overactive bladder: Secondary | ICD-10-CM | POA: Diagnosis not present

## 2023-09-08 DIAGNOSIS — Z436 Encounter for attention to other artificial openings of urinary tract: Secondary | ICD-10-CM | POA: Diagnosis not present

## 2023-09-08 DIAGNOSIS — R4189 Other symptoms and signs involving cognitive functions and awareness: Secondary | ICD-10-CM | POA: Diagnosis not present

## 2023-09-08 DIAGNOSIS — R339 Retention of urine, unspecified: Secondary | ICD-10-CM | POA: Diagnosis not present

## 2023-09-08 DIAGNOSIS — E785 Hyperlipidemia, unspecified: Secondary | ICD-10-CM | POA: Diagnosis not present

## 2023-09-08 DIAGNOSIS — Z87442 Personal history of urinary calculi: Secondary | ICD-10-CM | POA: Diagnosis not present

## 2023-09-08 DIAGNOSIS — Z8673 Personal history of transient ischemic attack (TIA), and cerebral infarction without residual deficits: Secondary | ICD-10-CM | POA: Diagnosis not present

## 2023-09-08 DIAGNOSIS — E119 Type 2 diabetes mellitus without complications: Secondary | ICD-10-CM | POA: Diagnosis not present

## 2023-09-08 DIAGNOSIS — Z87440 Personal history of urinary (tract) infections: Secondary | ICD-10-CM | POA: Diagnosis not present

## 2023-09-08 DIAGNOSIS — E876 Hypokalemia: Secondary | ICD-10-CM | POA: Diagnosis not present

## 2023-09-08 DIAGNOSIS — I471 Supraventricular tachycardia, unspecified: Secondary | ICD-10-CM | POA: Diagnosis not present

## 2023-09-08 DIAGNOSIS — N179 Acute kidney failure, unspecified: Secondary | ICD-10-CM | POA: Diagnosis not present

## 2023-09-08 DIAGNOSIS — Z794 Long term (current) use of insulin: Secondary | ICD-10-CM | POA: Diagnosis not present

## 2023-09-13 NOTE — Progress Notes (Unsigned)
 Prepubic tube change impression/Assessment:  -Incomplete bladder emptying, indwelling catheter present.  He does have history of catastrophic neurologic event.  He is still not very functional.  -History of ureteral stone, status post ureteroscopic management of left distal stone and March.    -Postplacement of SP tube status   Plan:  -I strongly counseled the patient's wife not to treat the urine based on appearance.  It seems like he has been on 3 antibiotics recently for urinary tract infections.  I did discuss the fact that unnecessary management with antibiotics can be quite harmful in the long-term  - 18 [place] Foley used for suprapubic replacement today  - I will have him come back in 6 weeks for his next suprapubic tube change-we will use a 20 [place] Foley.  After that, I think it is okay to get this done at home  History of Present Illness: 63 year old male here for follow-up.  Initially seen by me in December 2023.  The patient had acute urinary retention.  He was admitted to the hospital prior to that appointment with a GI bleed, encephalitis and examined his cholecystitis.  When I saw him, he was still significantly debilitated.  It was felt best to leave him with a catheter at least in the short-term  He underwent ureteroscopic management of a left UVJ stone by Dr. Medford Han on 03/30/2022.  His stent was subsequently removed by me at his last visit here in April.  8.6.2024: For recheck.  Has his catheter changed every month.  His biliary tube is changed every couple of months.  He has not been treated for recent urinary tract infection.  He does have his urine sent off once in a while for culture at the behest of his home health care nurse.  No improvement in neurologic situation.  7.18.2025: IR visit for sp tube placement. 16 fr councill cath placed.  He has been treated for 3 UTIs recently.  It does not sound like he has become febrile, perhaps he has had mild mental status  change.  Past Medical History:  Diagnosis Date   Anemia    Arthritis    CVA (cerebral vascular accident)    last CVA 04/2021   Diabetes mellitus without complication    type 2   Hyperlipidemia    Hypertension    Kidney stones     Past Surgical History:  Procedure Laterality Date   ANKLE SURGERY     BIOPSY  09/10/2021   Procedure: BIOPSY;  Surgeon: Rollin Dover, MD;  Location: [facility] ENDOSCOPY;  Service: Gastroenterology;;   BIOPSY  10/07/2021   Procedure: BIOPSY;  Surgeon: Rollin Dover, MD;  Location: [facility] ENDOSCOPY;  Service: Gastroenterology;;   COLONOSCOPY N/A 10/07/2021   Procedure: COLONOSCOPY;  Surgeon: Rollin Dover, MD;  Location: [facility] ENDOSCOPY;  Service: Gastroenterology;  Laterality: N/A;   COLONOSCOPY WITH PROPOFOL  N/A 09/06/2021   Procedure: COLONOSCOPY WITH PROPOFOL ;  Surgeon: Eartha Angelia Sieving, MD;  [facility];  Service: Gastroenterology;  Laterality: N/A;   CYSTOSCOPY/URETEROSCOPY/HOLMIUM LASER/STENT PLACEMENT Left 03/30/2022   Procedure: CYSTOSCOPY/RETROGRADE/URETEROSCOPY/HOLMIUM LASER/STENT PLACEMENT;  Surgeon: Han Lonni Righter, MD;  Location: [facility] ORS;  Service: Urology;  Laterality: Left;   ENTEROSCOPY N/A 09/10/2021   Procedure: ENTEROSCOPY;  Surgeon: Rollin Dover, MD;  Location: [facility] ENDOSCOPY;  Service: Gastroenterology;  Laterality: N/A;   ENTEROSCOPY  09/06/2021   Procedure: ENTEROSCOPY;  Surgeon: Eartha Angelia Sieving, MD;  [facility];  Service: Gastroenterology;;   ESOPHAGOGASTRODUODENOSCOPY (EGD) WITH PROPOFOL   09/06/2021  Procedure: ESOPHAGOGASTRODUODENOSCOPY (EGD) WITH PROPOFOL ;  Surgeon: Eartha Flavors, Toribio, MD;  [facility];  Service: Gastroenterology;;   ENID SIGMOIDOSCOPY N/A 09/19/2021   Procedure: ENID MORIN;  Surgeon: Rollin Dover, MD;  Location: [facility] ENDOSCOPY;  Service: Gastroenterology;  Laterality: N/A;   FOREIGN BODY REMOVAL  09/19/2021   Procedure: FOREIGN BODY REMOVAL;  Surgeon:  Rollin Dover, MD;  Location: [facility] ENDOSCOPY;  Service: Gastroenterology;;   KRISTENE CAPSULE STUDY  09/06/2021   Procedure: GIVENS CAPSULE STUDY;  Surgeon: Eartha Flavors Toribio, MD;  [facility];  Service: Gastroenterology;;   HERNIA REPAIR     umbilical x1 Incisional x1   INCISIONAL HERNIA REPAIR  04/13/2011   Procedure: LAPAROSCOPIC INCISIONAL HERNIA;  Surgeon: Oneil DELENA Budge, MD;  Location: [facility] ORS;  Service: General;  Laterality: N/A;  Recurrent Laparoscopic Incisional Herniorraphy with Mesh   IR ANGIOGRAM SELECTIVE EACH ADDITIONAL VESSEL  09/09/2021   IR ANGIOGRAM VISCERAL SELECTIVE  09/07/2021   IR ANGIOGRAM VISCERAL SELECTIVE  09/06/2021   IR ANGIOGRAM VISCERAL SELECTIVE  09/06/2021   IR CHOLANGIOGRAM EXISTING TUBE  11/06/2021   IR EMBO ART  VEN HEMORR LYMPH EXTRAV  INC GUIDE ROADMAPPING  09/06/2021   IR EXCHANGE BILIARY DRAIN  10/24/2021   IR EXCHANGE BILIARY DRAIN  11/19/2021   IR EXCHANGE BILIARY DRAIN  01/19/2022   IR EXCHANGE BILIARY DRAIN  02/19/2022   IR EXCHANGE BILIARY DRAIN  04/01/2022   IR GUIDED DRAIN W CATHETER PLACEMENT  09/07/2021   IR US  GUIDE BX ASP/DRAIN  09/07/2021   IR US  GUIDE VASC ACCESS RIGHT  09/07/2021   IR US  GUIDE VASC ACCESS RIGHT  09/06/2021   KIDNEY STONE SURGERY     RADIOLOGY WITH ANESTHESIA N/A 03/10/2022   Procedure: MRI WITH ANESTHESIA OF BRAIN WITH AND WITHOUT CONTRAST;  Surgeon: Radiologist, Medication, MD;  Location: [facility] OR;  Service: Radiology;  Laterality: N/A;    Home Medications:  Allergies as of 04/21/2022       Reactions   Bactrim  [sulfamethoxazole -trimethoprim ] Rash   Amoxicillin  Hives   Tolerated Cefepime         Medication List        Accurate as of April 21, 2022  8:59 AM. If you have any questions, ask your nurse or doctor.          BD Veo Insulin  Syringe U/F 31G X 15/64 1 ML Misc Generic drug: Insulin  Syringe-Needle U-100 USE AS DIRECTED   glucose blood test strip Commonly known as: OneTouch Verio TEST twice a day    Insulin  Pen Needle 32G X 5 MM Misc Commonly known as: NovoTwist Use two daily to inject Victoza  and Toujeo .   methocarbamol  500 MG tablet Commonly known as: ROBAXIN  Take 1 tablet (500 mg total) by mouth every 6 (six) hours as needed for muscle spasms.   metoprolol  succinate 25 MG 24 hr tablet Commonly known as: TOPROL -XL Take 0.5 tablets (12.5 mg total) by mouth daily.   OneTouch Delica Lancets 33G Misc Use to check blood sugar once a day dx code E11.65   pantoprazole  40 MG tablet Commonly known as: PROTONIX  Take 1 tablet (40 mg total) by mouth daily. What changed: when to take this   potassium chloride  SA 20 MEQ tablet Commonly known as: KLOR-CON  M Take 2 tablets (40 mEq total) by mouth 2 (two) times daily.   PROBIOTIC PO Take 8.5 mg by mouth daily.   rosuvastatin  40 MG tablet Commonly known as: Crestor  Take 1 tablet (40 mg total) by mouth  daily.   traZODone  50 MG tablet Commonly known as: DESYREL  Take 0.5-1 tablets (25-50 mg total) by mouth at bedtime as needed for sleep. What changed:  how much to take when to take this   Tresiba  FlexTouch 200 UNIT/ML FlexTouch Pen Generic drug: insulin  degludec Inject under skin 15 units twice a day. What changed:  how much to take how to take this when to take this additional instructions   TYLENOL  500 MG tablet Generic drug: acetaminophen  Take 1 tablet (500 mg total) by mouth every 6 (six) hours as needed for mild pain or headache.        Allergies:  Allergies  Allergen Reactions   Bactrim  [Sulfamethoxazole -Trimethoprim ] Rash   Amoxicillin  Hives    Tolerated Cefepime     Family History  Problem Relation Age of Onset   Heart failure Mother    Diabetes Father    Cancer Other    Heart attack Other    Anesthesia problems Neg Hx    Hypotension Neg Hx    Malignant hyperthermia Neg Hx    Pseudochol deficiency Neg Hx    Colon cancer Neg Hx    Inflammatory bowel disease Neg Hx     Social History:  reports  that he has never smoked. He has quit using smokeless tobacco.  His smokeless tobacco use included snuff. He reports that he does not drink alcohol and does not use drugs.  ROS: A complete review of systems was performed.  All systems are negative except for pertinent findings as noted.  Physical Exam:  Vital signs in last 24 hours: There were no vitals taken for this visit. Procedure note: Old suprapubic tube was easily removed.  SP site was then prepped, and an 48 French Foley catheter was placed quite easily through the suprapubic tract.  Balloon filled with 10 cc of water , hooked to dependent drainage.  I have reviewed prior pt notes  I have reviewed notes from IR visit  I have reviewed prior urine culture

## 2023-09-14 ENCOUNTER — Ambulatory Visit: Admitting: Urology

## 2023-09-14 VITALS — BP 159/81 | HR 52

## 2023-09-14 DIAGNOSIS — R339 Retention of urine, unspecified: Secondary | ICD-10-CM | POA: Diagnosis not present

## 2023-09-14 DIAGNOSIS — Z87442 Personal history of urinary calculi: Secondary | ICD-10-CM

## 2023-09-14 DIAGNOSIS — Z435 Encounter for attention to cystostomy: Secondary | ICD-10-CM

## 2023-09-14 DIAGNOSIS — Z960 Presence of urogenital implants: Secondary | ICD-10-CM

## 2023-09-24 IMAGING — CT CT HEAD W/O CM
3 of 4 series · 15 of 47 positions shown, 18 images · non-contrast
Comparison: None.

CLINICAL DATA: Neuro deficit, acute, stroke suspected



[Series 2: head w o · axial · 0.45mm/px · z∈[+1,+131]mm · 9 of 32 slices shown, 12 images]
[im 3/32  brain]
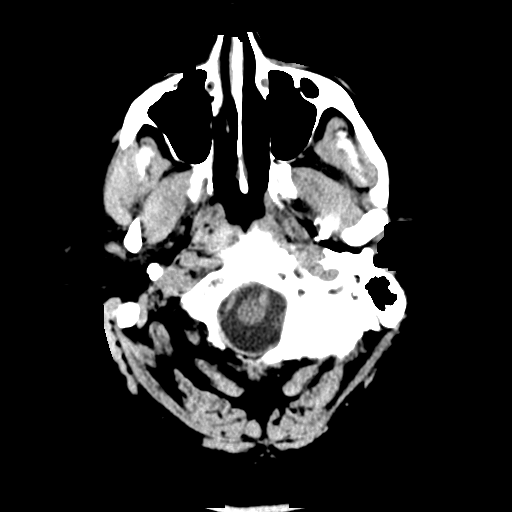
[im 3/32  bone]
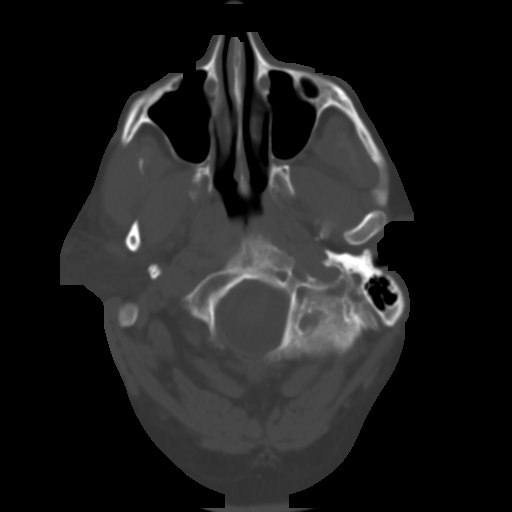
[im 7/32  brain]
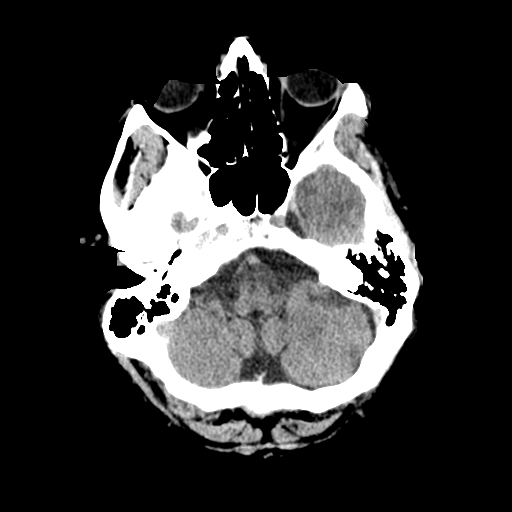
[im 9/32  brain]
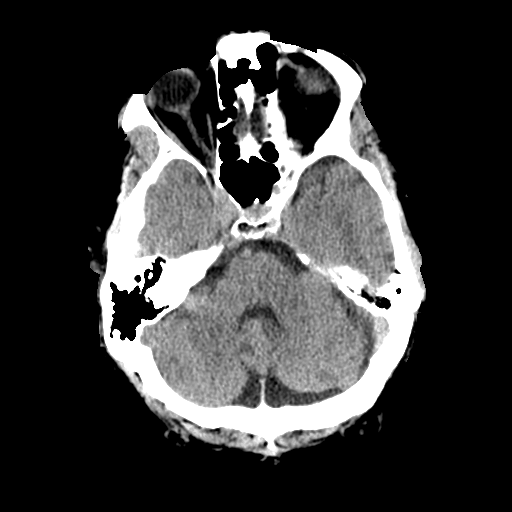
[im 14/32  brain]
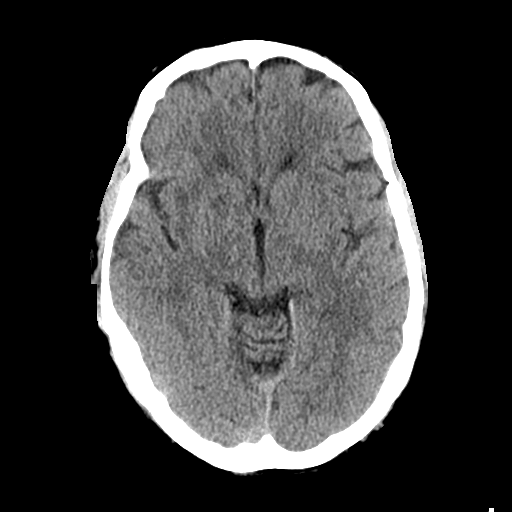
[im 16/32  brain]
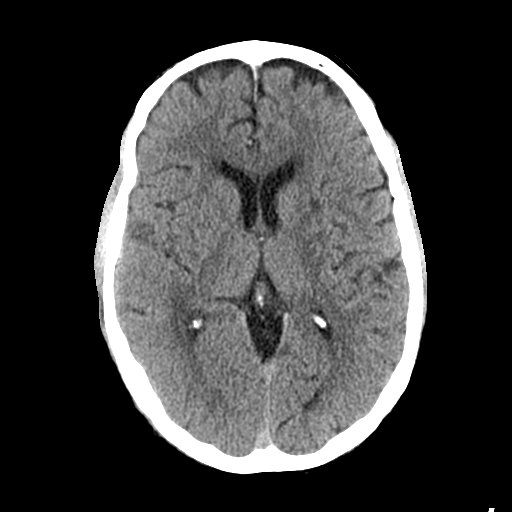
[im 16/32  bone]
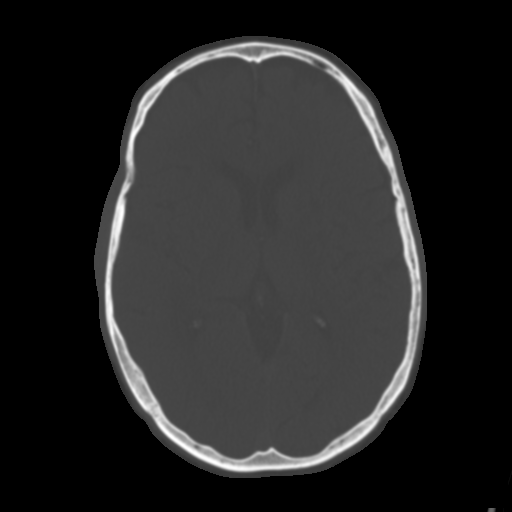
[im 18/32  brain]
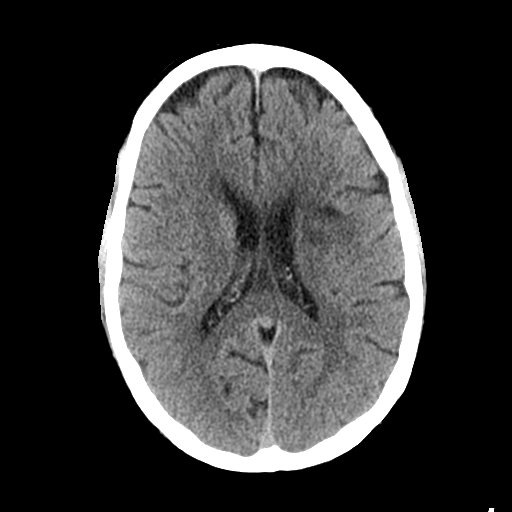
[im 23/32  brain]
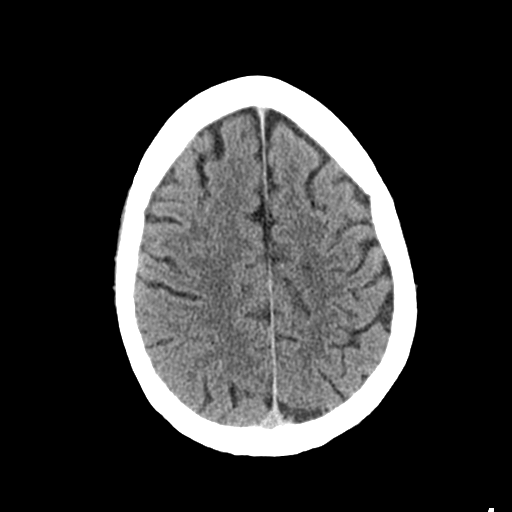
[im 25/32  brain]
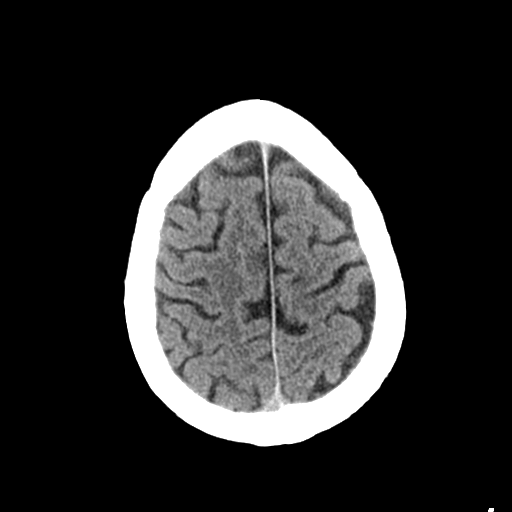
[im 29/32  brain]
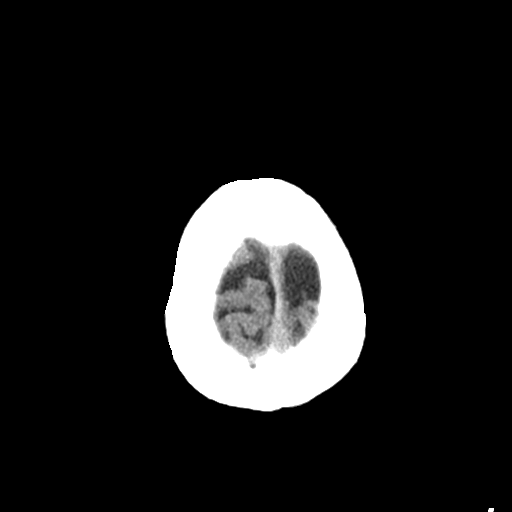
[im 29/32  bone]
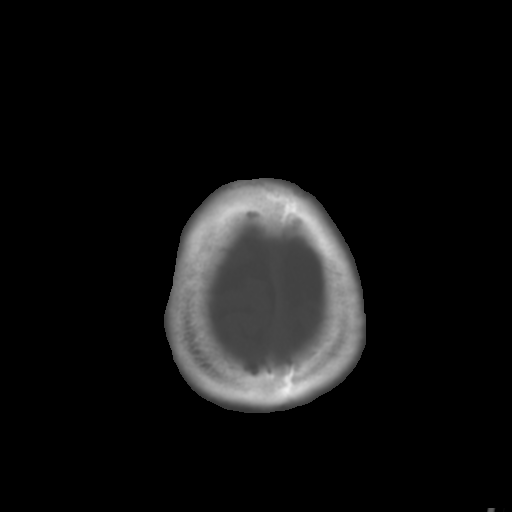

[Series 4: coronal soft · coronal · 0.33mm/px · 3 of 70 slices shown]
[im 24/70  brain]
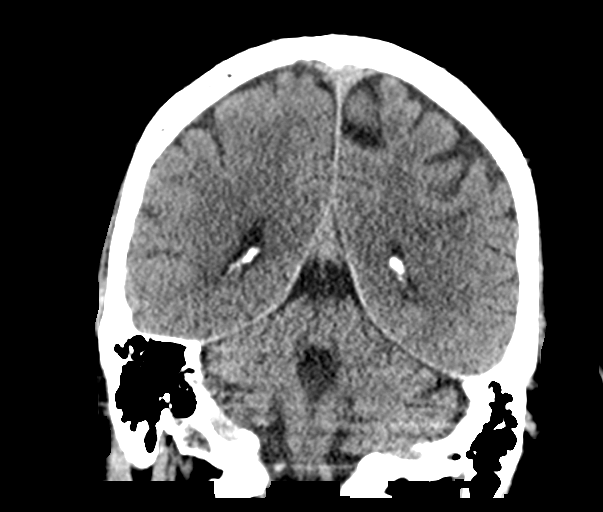
[im 31/70  brain]
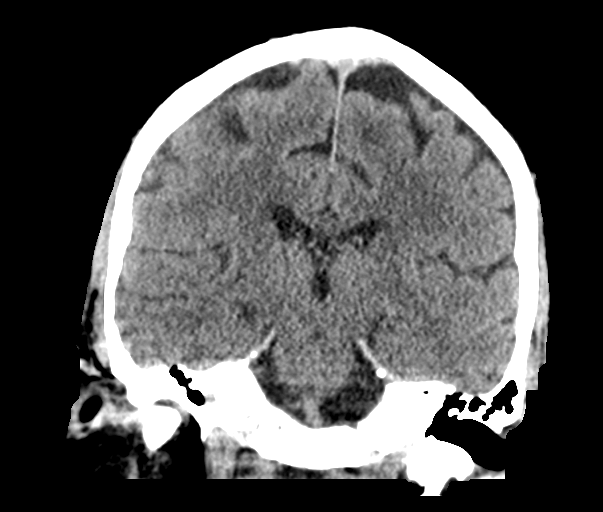
[im 39/70  brain]
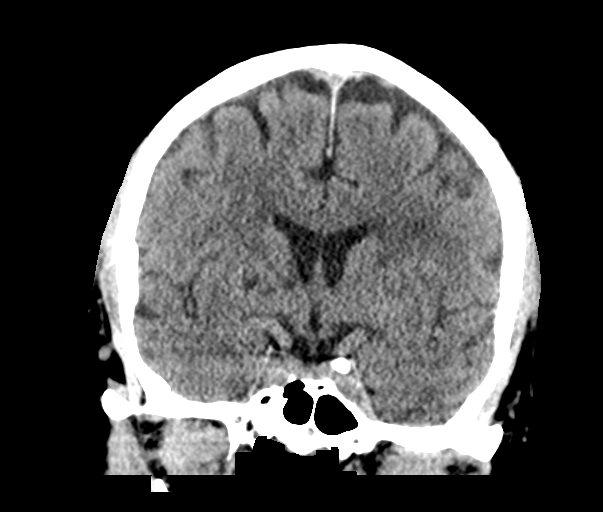

[Series 5: sagittal soft · sagittal · 0.37mm/px · 3 of 61 slices shown]
[im 21/61  brain]
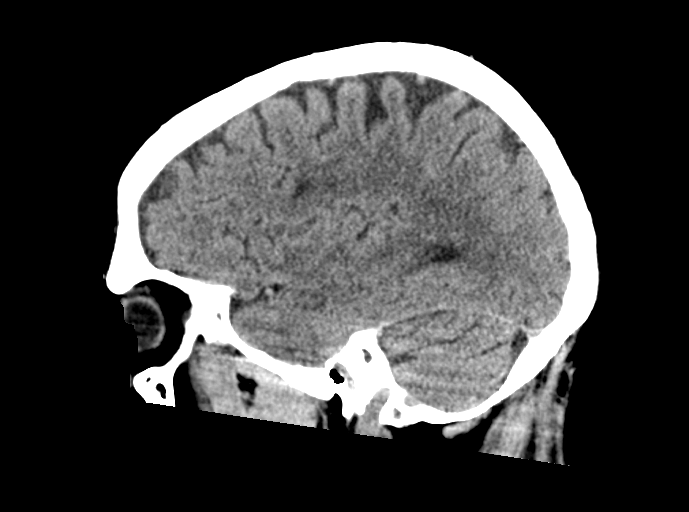
[im 31/61  brain]
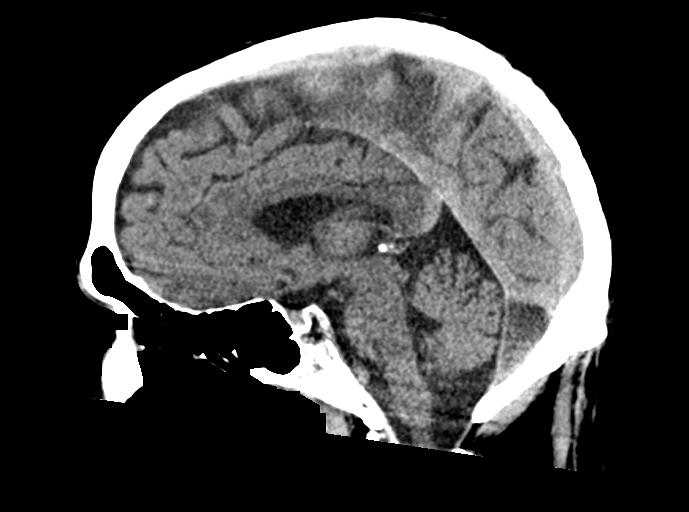
[im 41/61  brain]
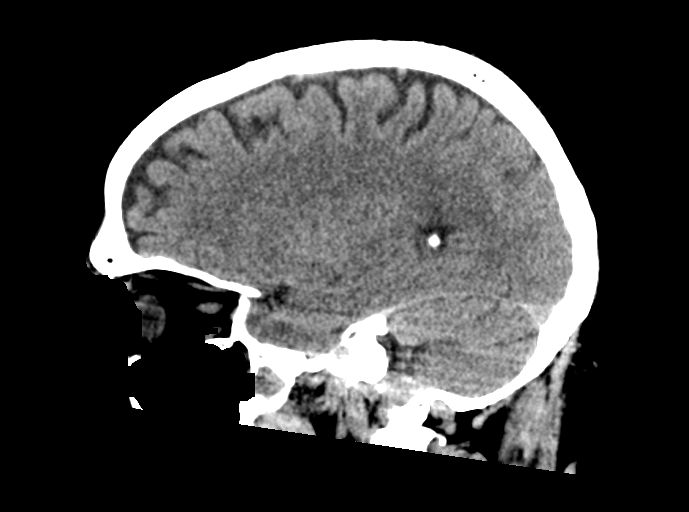

[15 of 47 positions shown; findings below may reference images not displayed]

FINDINGS: Brain: Ill-defined hypodensities in the right basal ganglia and left
corona radiata, compatible with age-indeterminate infarcts. No mass
occupying acute hemorrhage. No significant mass effect or midline
shift. No hydrocephalus or extra-axial fluid collection. Mega
cisterna magna.

Vascular: No hyperdense vessel identified.

Skull: No acute fracture.

Sinuses/Orbits: Clear sinuses.  No acute orbital findings.

Other: No mastoid effusions.
IMPRESSION: Ill-defined hypodensities in the right basal ganglia and left corona
radiata, compatible with age-indeterminate infarcts. See
forthcoming/ordered MRI for further evaluation

## 2023-10-05 DIAGNOSIS — F331 Major depressive disorder, recurrent, moderate: Secondary | ICD-10-CM | POA: Diagnosis not present

## 2023-10-05 DIAGNOSIS — N401 Enlarged prostate with lower urinary tract symptoms: Secondary | ICD-10-CM | POA: Diagnosis not present

## 2023-10-05 DIAGNOSIS — I10 Essential (primary) hypertension: Secondary | ICD-10-CM | POA: Diagnosis not present

## 2023-10-05 DIAGNOSIS — E1169 Type 2 diabetes mellitus with other specified complication: Secondary | ICD-10-CM | POA: Diagnosis not present

## 2023-10-06 ENCOUNTER — Other Ambulatory Visit: Payer: Self-pay

## 2023-10-06 ENCOUNTER — Encounter (HOSPITAL_COMMUNITY): Payer: Self-pay

## 2023-10-06 NOTE — Anesthesia Preprocedure Evaluation (Signed)
 Anesthesia Evaluation  Patient identified by MRN, date of birth, ID band Patient awake  General Assessment Comment:Patient awake and alert, but AO x 2 (self and place, does not know the year, and unable to name the procedure being done today). Wife says he has ups and downs with his mentation.  Reviewed: Allergy & Precautions, NPO status , Patient's Chart, lab work & pertinent test results  History of Anesthesia Complications Negative for: history of anesthetic complications  Airway Mallampati: II  TM Distance: >3 FB Neck ROM: Full    Dental no notable dental hx. (+) Teeth Intact   Pulmonary neg pulmonary ROS, neg sleep apnea, neg COPD, Patient abstained from smoking.Not current smoker   Pulmonary exam normal breath sounds clear to auscultation       Cardiovascular Exercise Tolerance: Good METShypertension, Pt. on medications (-) CAD and (-) Past MI (-) dysrhythmias  Rhythm:Regular Rate:Normal - Systolic murmurs    Neuro/Psych Overall decreased mentation CVA, Residual Symptoms  negative psych ROS   GI/Hepatic ,neg GERD  ,,(+)     (-) substance abuse    Endo/Other  diabetes    Renal/GU negative Renal ROS     Musculoskeletal   Abdominal   Peds  Hematology   Anesthesia Other Findings Past Medical History: No date: Anemia No date: Arthritis No date: CVA (cerebral vascular accident) (HCC)     Comment:  last CVA 04/2021 No date: Diabetes mellitus without complication (HCC)     Comment:  type 2 No date: Hyperlipidemia No date: Hypertension No date: Kidney stones  Reproductive/Obstetrics                              Anesthesia Physical Anesthesia Plan  ASA: 3  Anesthesia Plan: General   Post-op Pain Management:    Induction: Intravenous  PONV Risk Score and Plan: 2 and Ondansetron , Dexamethasone  and Treatment may vary due to age or medical condition  Airway Management Planned:  Oral ETT  Additional Equipment: None  Intra-op Plan:   Post-operative Plan: Extubation in OR  Informed Consent: I have reviewed the patients History and Physical, chart, labs and discussed the procedure including the risks, benefits and alternatives for the proposed anesthesia with the patient or authorized representative who has indicated his/her understanding and acceptance.     Dental advisory given  Plan Discussed with: CRNA and Surgeon  Anesthesia Plan Comments: (Discussed risks of anesthesia with patient and his wife at bedside, including PONV, sore throat, lip/dental/eye damage. Rare risks discussed as well, such as cardiorespiratory and neurological sequelae, and allergic reactions. Discussed the role of CRNA in patient's perioperative care. Patient understands.)         Anesthesia Quick Evaluation

## 2023-10-06 NOTE — Progress Notes (Signed)
 PCP - Norleen PEDLAR. Shona, MD  Cardiologist -  Urology- Matilda Senior, MD (patient does have a supra pubic catheter)  PPM/ICD - denies Device Orders - n/a Rep Notified - n/a  Chest x-ray - denies EKG - DOS Stress Test - denies ECHO - 113-23 Cardiac Cath - denies  CPAP - denies  GLP-1 -  Fasting Blood Sugar - Per wife blood sugar range from 118-140 Checks Blood Sugar Daily  Blood Thinner Instructions: denies Aspirin  Instructions: denies  ERAS Protcol - clear liquids until 7: 00 a.m.  COVID TEST- n/a  Anesthesia review: yes, Hx of HTN, Stoke, DM. Per wife patient has a Hemostatic clip, and she will bring papers to show.  Patient verbally denies any shortness of breath, fever, cough and chest pain during phone call   -------------  SDW INSTRUCTIONS given:  Your procedure is scheduled on October 07, 2023.  Report to [facility] Main Entrance A at 7:30 A.M., and check in at the Admitting office.  Call this number if you have problems the morning of surgery:  [phone]   Remember:  Do not eat after midnight the night before your surgery  You may drink clear liquids until 7:00 the morning of your surgery.   Clear liquids allowed are: Water , Non-Citrus Juices (without pulp), Carbonated Beverages, Clear Tea, Black Coffee Only, and Gatorade    Take these medicines the morning of surgery with A SIP OF WATER   levocetirizine (XYZAL)  metoprolol  succinate (TOPROL -XL)  oxybutynin  (DITROPAN  ) pantoprazole  (PROTONIX )  rosuvastatin  (CRESTOR )  sertraline (ZOLOFT)    As of today, STOP taking any Aspirin  (unless otherwise instructed by your surgeon) Aleve, Naproxen, Ibuprofen, Motrin, Advil, Goody's, BC's, all herbal medications, fish oil, and all vitamins.    WHAT DO I DO ABOUT MY DIABETES MEDICATION?   Do not take oral diabetes medicines (pills) the morning of surgery.       THE MORNING OF SURGERY, take 9 units of TRESIBA  FLEXTOUCH insulin .  (Half of normal dose)  The  day of surgery, do not take other diabetes injectables, including Byetta (exenatide), Bydureon (exenatide ER), Victoza  (liraglutide ), or Trulicity (dulaglutide).  If your CBG is greater than 220 mg/dL, you may take  of your sliding scale (correction) dose of insulin .   HOW TO MANAGE YOUR DIABETES BEFORE AND AFTER SURGERY  Why is it important to control my blood sugar before and after surgery? Improving blood sugar levels before and after surgery helps healing and can limit problems. A way of improving blood sugar control is eating a healthy diet by:  Eating less sugar and carbohydrates  Increasing activity/exercise  Talking with your doctor about reaching your blood sugar goals High blood sugars (greater than 180 mg/dL) can raise your risk of infections and slow your recovery, so you will need to focus on controlling your diabetes during the weeks before surgery. Make sure that the doctor who takes care of your diabetes knows about your planned surgery including the date and location.  How do I manage my blood sugar before surgery? Check your blood sugar at least 4 times a day, starting 2 days before surgery, to make sure that the level is not too high or low.  Check your blood sugar the morning of your surgery when you wake up and every 2 hours until you get to the Short Stay unit.  If your blood sugar is less than 70 mg/dL, you will need to treat for low blood sugar: Do not take insulin . Treat a low  blood sugar (less than 70 mg/dL) with  cup of clear juice (cranberry or apple), 4 glucose tablets, OR glucose gel. Recheck blood sugar in 15 minutes after treatment (to make sure it is greater than 70 mg/dL). If your blood sugar is not greater than 70 mg/dL on recheck, call [phone] for further instructions. Report your blood sugar to the short stay nurse when you get to Short Stay.  If you are admitted to the hospital after surgery: Your blood sugar will be checked by the staff and  you will probably be given insulin  after surgery (instead of oral diabetes medicines) to make sure you have good blood sugar levels. The goal for blood sugar control after surgery is 80-180 mg/dL.                   Do not wear jewelry, make up, or nail polish            Do not wear lotions, powders, perfumes/colognes, or deodorant.            Do not shave 48 hours prior to surgery.  Men may shave face and neck.            Do not bring valuables to the hospital.            [facility] is not responsible for any belongings or valuables.  Do NOT Smoke (Tobacco/Vaping) 24 hours prior to your procedure If you use a CPAP at night, you may bring all equipment for your overnight stay.   Contacts, glasses, dentures or bridgework may not be worn into surgery.      For patients admitted to the hospital, discharge time will be determined by your treatment team.   Patients discharged the day of surgery will not be allowed to drive home, and someone needs to stay with them for 24 hours.    Special instructions:   [place]- Preparing For Surgery  Before surgery, you can play an important role. Because skin is not sterile, your skin needs to be as free of germs as possible. You can reduce the number of germs on your skin by washing with CHG (chlorahexidine gluconate) Soap before surgery.  CHG is an antiseptic cleaner which kills germs and bonds with the skin to continue killing germs even after washing.    Oral Hygiene is also important to reduce your risk of infection.  Remember - BRUSH YOUR TEETH THE MORNING OF SURGERY WITH YOUR REGULAR TOOTHPASTE  Please do not use if you have an allergy to CHG or antibacterial soaps. If your skin becomes reddened/irritated stop using the CHG.  Do not shave (including legs and underarms) for at least 48 hours prior to first CHG shower. It is OK to shave your face.  Please follow these instructions carefully.   Shower the NIGHT BEFORE SURGERY and the MORNING OF  SURGERY with DIAL Soap.   Pat yourself dry with a CLEAN TOWEL.  Wear CLEAN PAJAMAS to bed the night before surgery  Place CLEAN SHEETS on your bed the night of your first shower and DO NOT SLEEP WITH PETS.   Day of Surgery: Please shower morning of surgery  Wear Clean/Comfortable clothing the morning of surgery Do not apply any deodorants/lotions.   Remember to brush your teeth WITH YOUR REGULAR TOOTHPASTE.   Questions were answered. Patient verbalized understanding of instructions.

## 2023-10-06 NOTE — Progress Notes (Signed)
 Anesthesia Chart Review:  Case: [id number] Date/Time: 10/07/23 0945   Procedure: MRI WITH ANESTHESIA - MRI OF BRAIN WITH AND WITHOUT CONTRAST   Anesthesia type: General   Diagnosis:      Gait disturbance [R26.9]     Septic encephalopathy [G93.41]   Pre-op diagnosis:      Gait disturbance     Septic encephalopathy   [facility] / [facility]   Surgeons: Radiologist, Medication, MD       DISCUSSION: Patient is a 63 year old male scheduled for the above procedure.  H&P form completed on 09/22/2023 by Rocky Burow, FNP-C (scanned under Media tab).   History includes never smoker, HTN, HLD, DM2 (diagnosed 2015), CVA (04/29/21), anemia, GI bleed 09/04/21 (s/p coil embolization of SMA territory jejunal arcade branch 09/06/21), cholecystitis (s/p percutaneous drain 09/07/21, discontinued 05/28/23), ventral/umbilical hernia (s/p repair 03/17/02, 04/26/04, 04/13/11), tracheostomy (09/15/21-10/23/21), SVT (11/06/21), AKI (requiring CRRT 09/09/21-09/12/21), acute disseminated encephalomyelitis (s/p steroids, plasmapheresis (09/2021, see below for additional details), urinary retention (suspected due to his significant neurologic insult, chronic foley ~ 11/2021->s/p suprapubic catheter placement 16 Fr by IR 07/23/2023), nephrolithiasis (s/p holmium laser lithotripsy, left ureteral stent 03/30/22).    He was admitted on 04/29/2021 for CVA. He had a prolonged admission 09/04/21-1026/23. Initially presented for GI bleed, s/p multiple PRBC. 09/06/21 colonoscopy showed no active bleed but large clots. EGD showed small hiatal hernia, erosive gastropathy with no stigmata of recent bleeding, non-bleeding duodenal diverticulum. Small bowel enteroscopy revealed active bleed from proximal jejunum. CTA showed jejunal diverticular bleed which IR treated with coil embolization. He was intubated 09/07/21. Follow-up CTA showed no active bleeding but new gall bladder abnormality, s/p percutaneous cholecystomy tube by IR for E. coli  cholecystitis. He developed AKI and CRRT initiated 09/09/21-09/12/21. S/p percutaneous tracheostomy 09/15/21. MRI brain done on 09/20/21 for persistent confusion showed rapid development of numerous round white matter lesions in both cerebral hemispheres.  Repeat MRI 09/21/21 showed numerous contrast-enhancing bilateral white matter lesions, consistent with active demyelination but limited exam. S/p LP 09/22/21. No definite active demyelinization on 09/23/21 MRI cervical and thoracic spine. He received IV steroids but clinical exam not improved so underwent plasmapheresis for acute disseminated encephalomyelitis and demyelinating disease. Discharged to [facility] 10/30/21-11/06/21 for ongoing rehab therapies, but re-admitted 11/06/21 - 11/12/21 for tachycardia/SVT and bacteremia (blood cultures positive for E. Coli and urine for Klebsiella pneumoniae (in setting of recurrent urinary retention). He converted to SR, on metoprolol . Echo updated 11/07/21 and showed LVEF 70-75%, no regional wall motion abnormalities, normal RV systolic function, trivial MR, mild AI, ascending aorta 42 mm. He was admitted back to [facility] 11/12/21-11/26/21. He was discharged with foley catheter and cholecystostomy drain and follow-up with multiple specialists including neurology, urology, IR, general surgery.     Surgeon Dr. Debby Shipper was following for chronic cholecystitis which was managed per cholecystomy drain. He had successful drain capping trial from January-May 2025, and IR discontinued it on 05/28/2023.   He is followed by urology for urinary retention/incomplete bladder emptying. He had a chronic foley catheter since ~ 11/2021. It had been changed monthly. Decision made to proceed with suprapubic cath which was placed on 07/23/2023. Last replacement noted was on 09/14/2023, 8 Fr catheter by Dr. Matilda.   Last neurology visit with Dr. Vear was on 06/09/2023. Neurologically he had bee overall stable with persistent reduced focus, attention and  process. Speech slow. No dysphagia. He wrote, I believe the most likely cause of the encephalopathy that occurred in September 2023 was  septic emboli.  ADEM or MS is less likely given the appearance of the lesions in the CSF results.  It would be unusual for MS to have dozens of simultaneously enhancing lesions.   March 2024 MRI did not show any new lesions and CSF was normal.  The spinal cord has no lesions.   I do not recommend just beginning a disease modifying therapy at this time.SABRASABRAWe will recheck an MRI of the brain with and without contrast.  If there are new changes consistent with MS I would reconsider and we could start Aubagio or another disease modifying therapy associated with low risk of infection.  If there are no additional significant changes, then the likelihood of MS becomes even lower.  Anesthesia team to evaluate on the day of procedure.     VS:  Wt Readings from Last 3 Encounters:  07/23/23 87.5 kg  01/17/23 98.4 kg  04/03/22 98.4 kg   BP Readings from Last 3 Encounters:  09/14/23 (!) 159/81  07/23/23 (!) 201/96  01/21/23 (!) 175/94   Pulse Readings from Last 3 Encounters:  09/14/23 (!) 52  07/23/23 61  01/21/23 66    PROVIDERS: [name], MD is PCP  Vear Ade, MD is neurologist  Vanderbilt Ned, MD is general surgeon Matilda Senior, MD is urologist Carilyn Barter, MD is PM&R    LABS: Most recent labs results in [facility] include: Lab Results  Component Value Date   WBC 6.1 07/23/2023   HGB 13.8 07/23/2023   HCT 40.5 07/23/2023   PLT 152 07/23/2023   GLUCOSE 153 (H) 07/23/2023   ALT 88 (H) 01/21/2023   AST 18 01/21/2023   NA 135 07/23/2023   K 3.1 (L) 07/23/2023   CL 100 07/23/2023   CREATININE 0.76 07/23/2023   BUN 9 07/23/2023   CO2 26 07/23/2023   INR 1.0 07/23/2023     IMAGES: MRI brain 04/01/22: IMPRESSION: 1. Unchanged distribution of severe confluent white matter disease with development of multifocal vacuolation. The  distribution of lesions is compatible with a chronic demyelinating process. 2. No contrast-enhancing lesions to suggest active demyelination.    EKG:  EKG 03/30/22: Sinus tachycardia at 116 bpm.  Atrial premature complexes.  Left posterior fascicular block.  Abnormal R wave progression, late transition.  Borderline T wave abnormalities.    CV: Echo (limited) 11/07/21: IMPRESSIONS   1. Left ventricular ejection fraction, by estimation, is 70 to 75%. The  left ventricle has hyperdynamic function. The left ventricle has no  regional wall motion abnormalities. There is moderate concentric left  ventricular hypertrophy.   2. Right ventricular systolic function is normal. The right ventricular  size is normal.   3. The mitral valve is normal in structure. Trivial mitral valve  regurgitation. No evidence of mitral stenosis.   4. The aortic valve is tricuspid. Aortic valve regurgitation is mild.  Aortic valve sclerosis/calcification is present, without any evidence of  aortic stenosis.   5. Aortic dilatation noted. There is mild dilatation of the ascending  aorta, measuring 42 mm.    Echo (Complete) 09/09/21: IMPRESSIONS   1. Left ventricular ejection fraction, by estimation, is 65 to 70%. The  left ventricle has hyperdynamic function. The left ventricle has no  regional wall motion abnormalities. Left ventricular diastolic parameters  are indeterminate.   2. Right ventricular systolic function is normal. The right ventricular  size is normal. Tricuspid regurgitation signal is inadequate for assessing  PA pressure.   3. The mitral valve is normal  in structure. No evidence of mitral valve  regurgitation.   4. The aortic valve is tricuspid. There is mild calcification of the  aortic valve. There is mild thickening of the aortic valve. Aortic valve  regurgitation is not visualized. Aortic valve sclerosis is present, with  no evidence of aortic valve stenosis.  - Comparison(s): No  significant change from prior study. Prior images reviewed side by side.    US  Carotid 04/29/21: IMPRESSION: Right ICA narrowing less than 50% Left ICA stenosis estimated at 50-69% Normal antegrade vertebral flow bilaterally  Past Medical History:  Diagnosis Date   Anemia    Arthritis    CVA (cerebral vascular accident) (HCC)    last CVA 04/2021   Diabetes mellitus without complication (HCC)    type 2   Hyperlipidemia    Hypertension    Kidney stones     Past Surgical History:  Procedure Laterality Date   ANKLE SURGERY     BIOPSY  09/10/2021   Procedure: BIOPSY;  Surgeon: Rollin Dover, MD;  Location: [facility] ENDOSCOPY;  Service: Gastroenterology;;   BIOPSY  10/07/2021   Procedure: BIOPSY;  Surgeon: Rollin Dover, MD;  Location: [facility] ENDOSCOPY;  Service: Gastroenterology;;   COLONOSCOPY N/A 10/07/2021   Procedure: COLONOSCOPY;  Surgeon: Rollin Dover, MD;  Location: [facility] ENDOSCOPY;  Service: Gastroenterology;  Laterality: N/A;   COLONOSCOPY WITH PROPOFOL  N/A 09/06/2021   Procedure: COLONOSCOPY WITH PROPOFOL ;  Surgeon: Eartha Angelia Sieving, MD;  Location: [facility] ENDO SUITE;  Service: Gastroenterology;  Laterality: N/A;   CYSTOSCOPY/URETEROSCOPY/HOLMIUM LASER/STENT PLACEMENT Left 03/30/2022   Procedure: CYSTOSCOPY/RETROGRADE/URETEROSCOPY/HOLMIUM LASER/STENT PLACEMENT;  Surgeon: Devere Lonni Righter, MD;  Location: [facility] ORS;  Service: Urology;  Laterality: Left;   ENTEROSCOPY N/A 09/10/2021   Procedure: ENTEROSCOPY;  Surgeon: Rollin Dover, MD;  Location: [facility] ENDOSCOPY;  Service: Gastroenterology;  Laterality: N/A;   ENTEROSCOPY  09/06/2021   Procedure: ENTEROSCOPY;  Surgeon: Eartha Angelia Sieving, MD;  Location: [facility] ENDO SUITE;  Service: Gastroenterology;;   ESOPHAGOGASTRODUODENOSCOPY (EGD) WITH PROPOFOL   09/06/2021   Procedure: ESOPHAGOGASTRODUODENOSCOPY (EGD) WITH PROPOFOL ;  Surgeon: Eartha Angelia Sieving, MD;  Location: [facility] ENDO SUITE;  Service: Gastroenterology;;   ENID SIGMOIDOSCOPY  N/A 09/19/2021   Procedure: ENID MORIN;  Surgeon: Rollin Dover, MD;  Location: [facility] ENDOSCOPY;  Service: Gastroenterology;  Laterality: N/A;   FOREIGN BODY REMOVAL  09/19/2021   Procedure: FOREIGN BODY REMOVAL;  Surgeon: Rollin Dover, MD;  Location: [facility] ENDOSCOPY;  Service: Gastroenterology;;   KRISTENE CAPSULE STUDY  09/06/2021   Procedure: GIVENS CAPSULE STUDY;  Surgeon: Eartha Angelia Sieving, MD;  Location: [facility] ENDO SUITE;  Service: Gastroenterology;;   HERNIA REPAIR     umbilical x1 Incisional x1   INCISIONAL HERNIA REPAIR  04/13/2011   Procedure: LAPAROSCOPIC INCISIONAL HERNIA;  Surgeon: Oneil DELENA Budge, MD;  Location: [facility] ORS;  Service: General;  Laterality: N/A;  Recurrent Laparoscopic Incisional Herniorraphy with Mesh   IR ANGIOGRAM SELECTIVE EACH ADDITIONAL VESSEL  09/09/2021   IR ANGIOGRAM VISCERAL SELECTIVE  09/07/2021   IR ANGIOGRAM VISCERAL SELECTIVE  09/06/2021   IR ANGIOGRAM VISCERAL SELECTIVE  09/06/2021   IR CHOLANGIOGRAM EXISTING TUBE  11/06/2021   IR CHOLANGIOGRAM EXISTING TUBE  02/12/2023   IR CHOLANGIOGRAM EXISTING TUBE  05/28/2023   IR CYSTOSTOMY TUBE PLACEMENT/BLADDER ASPIRATION  07/23/2023   IR EMBO ART  VEN HEMORR LYMPH EXTRAV  INC GUIDE ROADMAPPING  09/06/2021   IR EXCHANGE BILIARY DRAIN  10/24/2021   IR EXCHANGE BILIARY DRAIN  11/19/2021   IR EXCHANGE BILIARY DRAIN  01/19/2022  IR EXCHANGE BILIARY DRAIN  02/19/2022   IR EXCHANGE BILIARY DRAIN  04/01/2022   IR EXCHANGE BILIARY DRAIN  05/27/2022   IR EXCHANGE BILIARY DRAIN  07/24/2022   IR EXCHANGE BILIARY DRAIN  09/24/2022   IR EXCHANGE BILIARY DRAIN  11/19/2022   IR EXCHANGE BILIARY DRAIN  01/18/2023   IR EXCHANGE BILIARY DRAIN  01/19/2023   IR EXCHANGE BILIARY DRAIN  04/16/2023   IR GUIDED DRAIN W CATHETER PLACEMENT  09/07/2021   IR REMOVAL OF CALCULI/DEBRIS BILIARY DUCT/GB  01/19/2023   IR US  GUIDE BX ASP/DRAIN  09/07/2021   IR US  GUIDE VASC ACCESS RIGHT  09/07/2021   IR US  GUIDE VASC ACCESS RIGHT  09/06/2021   KIDNEY STONE  SURGERY     RADIOLOGY WITH ANESTHESIA N/A 03/10/2022   Procedure: MRI WITH ANESTHESIA OF BRAIN WITH AND WITHOUT CONTRAST;  Surgeon: Radiologist, Medication, MD;  Location: [facility] OR;  Service: Radiology;  Laterality: N/A;    MEDICATIONS: No current facility-administered medications for this encounter.    CRANBERRY PO   levocetirizine (XYZAL) 5 MG tablet   loperamide (IMODIUM A-D) 2 MG tablet   metoprolol  succinate (TOPROL -XL) 25 MG 24 hr tablet   oxybutynin  (DITROPAN ) 5 MG tablet   pantoprazole  (PROTONIX ) 40 MG tablet   potassium chloride  (KLOR-CON ) 10 MEQ tablet   Probiotic Product (PROBIOTIC PO)   rosuvastatin  (CRESTOR ) 40 MG tablet   sertraline (ZOLOFT) 50 MG tablet   traZODone  (DESYREL ) 150 MG tablet   TRESIBA  FLEXTOUCH 200 UNIT/ML FlexTouch Pen   TYLENOL  500 MG tablet   BD VEO INSULIN  SYRINGE U/F 31G X 15/64 1 ML MISC   glucose blood (ONETOUCH VERIO) test strip   Insulin  Pen Needle (NOVOTWIST) 32G X 5 MM MISC   ONETOUCH DELICA LANCETS 33G MISC   traZODone  (DESYREL ) 50 MG tablet    Isaiah Ruder, PA-C Surgical Short Stay/Anesthesiology [facility] Phone [phone] [facility] Phone [phone] 10/06/2023 12:25 PM

## 2023-10-07 ENCOUNTER — Ambulatory Visit (HOSPITAL_COMMUNITY): Admission: RE | Admit: 2023-10-07 | Discharge: 2023-10-07 | Attending: Neurology | Admitting: Neurology

## 2023-10-07 ENCOUNTER — Ambulatory Visit (HOSPITAL_COMMUNITY): Payer: Self-pay | Admitting: Vascular Surgery

## 2023-10-07 ENCOUNTER — Ambulatory Visit (HOSPITAL_COMMUNITY)
Admission: RE | Admit: 2023-10-07 | Discharge: 2023-10-07 | Disposition: A | Discharge status: Home / Self Care | Attending: Neurology | Admitting: Neurology

## 2023-10-07 ENCOUNTER — Encounter (HOSPITAL_COMMUNITY): Admission: RE | Disposition: A | Payer: Self-pay | Source: Home / Self Care | Attending: Neurology

## 2023-10-07 ENCOUNTER — Ambulatory Visit (HOSPITAL_BASED_OUTPATIENT_CLINIC_OR_DEPARTMENT_OTHER): Payer: Self-pay | Admitting: Vascular Surgery

## 2023-10-07 ENCOUNTER — Telehealth: Payer: Self-pay | Admitting: Neurology

## 2023-10-07 ENCOUNTER — Encounter (HOSPITAL_COMMUNITY): Payer: Self-pay

## 2023-10-07 ENCOUNTER — Ambulatory Visit: Payer: Self-pay | Admitting: Neurology

## 2023-10-07 DIAGNOSIS — R6520 Severe sepsis without septic shock: Secondary | ICD-10-CM | POA: Insufficient documentation

## 2023-10-07 DIAGNOSIS — G9341 Metabolic encephalopathy: Secondary | ICD-10-CM

## 2023-10-07 DIAGNOSIS — R9082 White matter disease, unspecified: Secondary | ICD-10-CM | POA: Diagnosis not present

## 2023-10-07 DIAGNOSIS — Z79899 Other long term (current) drug therapy: Secondary | ICD-10-CM | POA: Diagnosis not present

## 2023-10-07 DIAGNOSIS — I10 Essential (primary) hypertension: Secondary | ICD-10-CM

## 2023-10-07 DIAGNOSIS — E785 Hyperlipidemia, unspecified: Secondary | ICD-10-CM | POA: Insufficient documentation

## 2023-10-07 DIAGNOSIS — G319 Degenerative disease of nervous system, unspecified: Secondary | ICD-10-CM | POA: Diagnosis not present

## 2023-10-07 DIAGNOSIS — R269 Unspecified abnormalities of gait and mobility: Secondary | ICD-10-CM

## 2023-10-07 DIAGNOSIS — Z8673 Personal history of transient ischemic attack (TIA), and cerebral infarction without residual deficits: Secondary | ICD-10-CM | POA: Diagnosis not present

## 2023-10-07 DIAGNOSIS — A419 Sepsis, unspecified organism: Secondary | ICD-10-CM | POA: Diagnosis not present

## 2023-10-07 DIAGNOSIS — E119 Type 2 diabetes mellitus without complications: Secondary | ICD-10-CM

## 2023-10-07 DIAGNOSIS — I471 Supraventricular tachycardia, unspecified: Secondary | ICD-10-CM

## 2023-10-07 DIAGNOSIS — Q048 Other specified congenital malformations of brain: Secondary | ICD-10-CM | POA: Diagnosis not present

## 2023-10-07 DIAGNOSIS — G9340 Encephalopathy, unspecified: Secondary | ICD-10-CM | POA: Diagnosis not present

## 2023-10-07 HISTORY — PX: RADIOLOGY WITH ANESTHESIA: SHX6223

## 2023-10-07 LAB — POCT I-STAT, CHEM 8
BUN: 13 mg/dL (ref 8–23)
Calcium, Ion: 0.98 mmol/L — ABNORMAL LOW (ref 1.15–1.40)
Chloride: 107 mmol/L (ref 98–111)
Creatinine, Ser: 0.8 mg/dL (ref 0.61–1.24)
Glucose, Bld: 139 mg/dL — ABNORMAL HIGH (ref 70–99)
HCT: 40 % (ref 39.0–52.0)
Hemoglobin: 13.6 g/dL (ref 13.0–17.0)
Potassium: 3.5 mmol/L (ref 3.5–5.1)
Sodium: 140 mmol/L (ref 135–145)
TCO2: 23 mmol/L (ref 22–32)

## 2023-10-07 LAB — GLUCOSE, CAPILLARY: Glucose-Capillary: 147 mg/dL — ABNORMAL HIGH (ref 70–99)

## 2023-10-07 SURGERY — MRI WITH ANESTHESIA
Anesthesia: General

## 2023-10-07 MED ORDER — CHLORHEXIDINE GLUCONATE 0.12 % MT SOLN: 15.0000 mL | Freq: Once | OROMUCOSAL | Status: DC

## 2023-10-07 MED ORDER — SUCCINYLCHOLINE CHLORIDE 200 MG/10ML IV SOSY
PREFILLED_SYRINGE | INTRAVENOUS | Status: DC | PRN
  Administered 2023-10-07: 150 mg via INTRAVENOUS

## 2023-10-07 MED ORDER — LACTATED RINGERS IV SOLN: INTRAVENOUS | Status: DC

## 2023-10-07 MED ORDER — ONDANSETRON HCL 4 MG/2ML IJ SOLN
INTRAMUSCULAR | Status: DC | PRN
  Administered 2023-10-07: 4 mg via INTRAVENOUS

## 2023-10-07 MED ORDER — EPHEDRINE SULFATE-NACL 50-0.9 MG/10ML-% IV SOSY
PREFILLED_SYRINGE | INTRAVENOUS | Status: DC | PRN
  Administered 2023-10-07: 5 mg via INTRAVENOUS

## 2023-10-07 MED ORDER — ORAL CARE MOUTH RINSE: 15.0000 mL | Freq: Once | OROMUCOSAL | Status: DC

## 2023-10-07 MED ORDER — INSULIN ASPART 100 UNIT/ML IJ SOLN
0.0000 [IU] | INTRAMUSCULAR | Status: DC | PRN
  Administered 2023-10-07: 2 [IU] via SUBCUTANEOUS
  Filled 2023-10-07: qty 1

## 2023-10-07 MED ORDER — GADOBUTROL 1 MMOL/ML IV SOLN
8.0000 mL | Freq: Once | INTRAVENOUS | Status: AC | PRN
  Administered 2023-10-07: 8 mL via INTRAVENOUS

## 2023-10-07 MED ORDER — LIDOCAINE 2% (20 MG/ML) 5 ML SYRINGE
INTRAMUSCULAR | Status: DC | PRN
  Administered 2023-10-07: 80 mg via INTRAVENOUS

## 2023-10-07 MED ORDER — GLYCOPYRROLATE 0.2 MG/ML IJ SOLN
INTRAMUSCULAR | Status: DC | PRN
Start: 2023-10-07 — End: 2023-10-07
  Administered 2023-10-07: .2 mg via INTRAVENOUS

## 2023-10-07 MED ORDER — CHLORHEXIDINE GLUCONATE 0.12 % MT SOLN
OROMUCOSAL | Status: AC
  Filled 2023-10-07: qty 15

## 2023-10-07 MED ORDER — PROPOFOL 10 MG/ML IV BOLUS
INTRAVENOUS | Status: DC | PRN
  Administered 2023-10-07: 150 mg via INTRAVENOUS

## 2023-10-07 MED ORDER — PHENYLEPHRINE 80 MCG/ML (10ML) SYRINGE FOR IV PUSH (FOR BLOOD PRESSURE SUPPORT)
PREFILLED_SYRINGE | INTRAVENOUS | Status: DC | PRN
  Administered 2023-10-07 (×3): 160 ug via INTRAVENOUS

## 2023-10-07 NOTE — Anesthesia Postprocedure Evaluation (Signed)
 Anesthesia Post Note  Patient: Troy Randall  Procedure(s) Performed: MRI WITH ANESTHESIA     Patient location during evaluation: PACU Anesthesia Type: General Level of consciousness: awake and alert Pain management: pain level controlled Vital Signs Assessment: post-procedure vital signs reviewed and stable Respiratory status: spontaneous breathing, nonlabored ventilation, respiratory function stable and patient connected to nasal cannula oxygen Cardiovascular status: blood pressure returned to baseline and stable Postop Assessment: no apparent nausea or vomiting Anesthetic complications: no   There were no known notable events for this encounter.  Last Vitals:  Vitals:   10/07/23 1100 10/07/23 1110  BP: (!) 174/85 (!) 175/92  Pulse: 64 61  Resp: 11 12  Temp:  36.5 C  SpO2: 99% 100%    Last Pain:  Vitals:   10/07/23 1040  TempSrc:   PainSc: 0-No pain                 Rome Ade

## 2023-10-07 NOTE — Transfer of Care (Signed)
 Immediate Anesthesia Transfer of Care Note  Patient: Troy Randall  Procedure(s) Performed: MRI WITH ANESTHESIA  Patient Location: PACU  Anesthesia Type:General  Level of Consciousness: awake, alert , and oriented  Airway & Oxygen Therapy: Patient Spontanous Breathing  Post-op Assessment: Report given to RN and Post -op Vital signs reviewed and stable  Post vital signs: Reviewed and stable  Last Vitals:  Vitals Value Taken Time  BP    Temp    Pulse 62 10/07/23 11:15  Resp 6 10/07/23 11:15  SpO2 95 % 10/07/23 11:15  Vitals shown include unfiled device data.  Last Pain:  Vitals:   10/07/23 1040  TempSrc:   PainSc: 0-No pain         Complications: There were no known notable events for this encounter.

## 2023-10-07 NOTE — Telephone Encounter (Signed)
 [facility] [place]) Urgent MRI results. Patient has something going on. Pleas call [phone] to give urgent results for the patient.

## 2023-10-07 NOTE — Anesthesia Procedure Notes (Signed)
 Procedure Name: Intubation Date/Time: 10/07/2023 9:34 AM  Performed by: Alen Motto D, CRNAPre-anesthesia Checklist: Patient identified, Emergency Drugs available, Suction available and Patient being monitored Patient Re-evaluated:Patient Re-evaluated prior to induction Oxygen Delivery Method: Circle System Utilized Preoxygenation: Pre-oxygenation with 100% oxygen Induction Type: IV induction Ventilation: Mask ventilation without difficulty Laryngoscope Size: Mac and 4 Grade View: Grade I Tube type: Oral Tube size: 7.5 mm Number of attempts: 1 Airway Equipment and Method: Stylet and Oral airway Placement Confirmation: ETT inserted through vocal cords under direct vision, positive ETCO2 and breath sounds checked- equal and bilateral Secured at: 22 cm Tube secured with: Tape Dental Injury: Teeth and Oropharynx as per pre-operative assessment

## 2023-10-08 ENCOUNTER — Encounter (HOSPITAL_COMMUNITY): Payer: Self-pay | Admitting: Radiology

## 2023-10-08 ENCOUNTER — Observation Stay (HOSPITAL_COMMUNITY): Admission: EM | Admit: 2023-10-08 | Discharge: 2023-10-09 | Disposition: A | Discharge status: Home / Self Care

## 2023-10-08 ENCOUNTER — Other Ambulatory Visit: Payer: Self-pay

## 2023-10-08 ENCOUNTER — Inpatient Hospital Stay (HOSPITAL_COMMUNITY)

## 2023-10-08 ENCOUNTER — Emergency Department (HOSPITAL_COMMUNITY)

## 2023-10-08 DIAGNOSIS — E876 Hypokalemia: Secondary | ICD-10-CM | POA: Diagnosis not present

## 2023-10-08 DIAGNOSIS — G988 Other disorders of nervous system: Principal | ICD-10-CM | POA: Insufficient documentation

## 2023-10-08 DIAGNOSIS — I6350 Cerebral infarction due to unspecified occlusion or stenosis of unspecified cerebral artery: Secondary | ICD-10-CM | POA: Diagnosis not present

## 2023-10-08 DIAGNOSIS — I639 Cerebral infarction, unspecified: Secondary | ICD-10-CM | POA: Diagnosis not present

## 2023-10-08 DIAGNOSIS — I6389 Other cerebral infarction: Secondary | ICD-10-CM | POA: Diagnosis not present

## 2023-10-08 DIAGNOSIS — Z7982 Long term (current) use of aspirin: Secondary | ICD-10-CM | POA: Diagnosis not present

## 2023-10-08 DIAGNOSIS — F419 Anxiety disorder, unspecified: Secondary | ICD-10-CM | POA: Diagnosis not present

## 2023-10-08 DIAGNOSIS — Z79899 Other long term (current) drug therapy: Secondary | ICD-10-CM | POA: Insufficient documentation

## 2023-10-08 DIAGNOSIS — K219 Gastro-esophageal reflux disease without esophagitis: Secondary | ICD-10-CM | POA: Insufficient documentation

## 2023-10-08 DIAGNOSIS — E119 Type 2 diabetes mellitus without complications: Secondary | ICD-10-CM | POA: Diagnosis not present

## 2023-10-08 DIAGNOSIS — Z7901 Long term (current) use of anticoagulants: Secondary | ICD-10-CM | POA: Insufficient documentation

## 2023-10-08 DIAGNOSIS — F32A Depression, unspecified: Secondary | ICD-10-CM | POA: Insufficient documentation

## 2023-10-08 DIAGNOSIS — Z8673 Personal history of transient ischemic attack (TIA), and cerebral infarction without residual deficits: Secondary | ICD-10-CM | POA: Diagnosis not present

## 2023-10-08 DIAGNOSIS — E785 Hyperlipidemia, unspecified: Secondary | ICD-10-CM | POA: Diagnosis present

## 2023-10-08 DIAGNOSIS — I10 Essential (primary) hypertension: Secondary | ICD-10-CM | POA: Diagnosis not present

## 2023-10-08 DIAGNOSIS — K5900 Constipation, unspecified: Secondary | ICD-10-CM | POA: Insufficient documentation

## 2023-10-08 DIAGNOSIS — Z794 Long term (current) use of insulin: Secondary | ICD-10-CM | POA: Insufficient documentation

## 2023-10-08 LAB — COMPREHENSIVE METABOLIC PANEL WITH GFR
ALT: 29 U/L (ref 0–44)
AST: 32 U/L (ref 15–41)
Albumin: 3.8 g/dL (ref 3.5–5.0)
Alkaline Phosphatase: 87 U/L (ref 38–126)
Anion gap: 10 (ref 5–15)
BUN: 6 mg/dL — ABNORMAL LOW (ref 8–23)
CO2: 26 mmol/L (ref 22–32)
Calcium: 8.9 mg/dL (ref 8.9–10.3)
Chloride: 103 mmol/L (ref 98–111)
Creatinine, Ser: 0.81 mg/dL (ref 0.61–1.24)
GFR, Estimated: >60 mL/min (ref >60)
Glucose, Bld: 170 mg/dL — ABNORMAL HIGH (ref 70–99)
Potassium: 3.3 mmol/L — ABNORMAL LOW (ref 3.5–5.1)
Sodium: 139 mmol/L (ref 135–145)
Total Bilirubin: 1.3 mg/dL — ABNORMAL HIGH (ref 0.0–1.2)
Total Protein: 7.5 g/dL (ref 6.5–8.1)

## 2023-10-08 LAB — CBC WITH DIFFERENTIAL/PLATELET
Abs Immature Granulocytes: 0.01 K/uL (ref 0.00–0.07)
Basophils Absolute: 0.1 K/uL (ref 0.0–0.1)
Basophils Relative: 1 %
Eosinophils Absolute: 0.1 K/uL (ref 0.0–0.5)
Eosinophils Relative: 2 %
HCT: 45 % (ref 39.0–52.0)
Hemoglobin: 15.3 g/dL (ref 13.0–17.0)
Immature Granulocytes: 0 %
Lymphocytes Relative: 15 %
Lymphs Abs: 1.1 K/uL (ref 0.7–4.0)
MCH: 28.4 pg (ref 26.0–34.0)
MCHC: 34 g/dL (ref 30.0–36.0)
MCV: 83.5 fL (ref 80.0–100.0)
Monocytes Absolute: 0.3 K/uL (ref 0.1–1.0)
Monocytes Relative: 4 %
Neutro Abs: 5.7 K/uL (ref 1.7–7.7)
Neutrophils Relative %: 78 %
Platelets: 175 K/uL (ref 150–400)
RBC: 5.39 MIL/uL (ref 4.22–5.81)
RDW: 12.7 % (ref 11.5–15.5)
WBC: 7.3 K/uL (ref 4.0–10.5)
nRBC: 0 % (ref 0.0–0.2)

## 2023-10-08 LAB — APTT: aPTT: 29 s (ref 24–36)

## 2023-10-08 LAB — GLUCOSE, CAPILLARY
Glucose-Capillary: 117 mg/dL — ABNORMAL HIGH (ref 70–99)
Glucose-Capillary: 164 mg/dL — ABNORMAL HIGH (ref 70–99)

## 2023-10-08 LAB — ECHOCARDIOGRAM COMPLETE
Area-P 1/2: 2.51 cm2
Calc EF: 67.1 %
Height: 72 in
S' Lateral: 3.2 cm
Single Plane A2C EF: 63.7 %
Single Plane A4C EF: 71.5 %
Weight: 2960 [oz_av]

## 2023-10-08 LAB — HEMOGLOBIN A1C
Hgb A1c MFr Bld: 5.4 % (ref 4.8–5.6)
Mean Plasma Glucose: 108.28 mg/dL

## 2023-10-08 LAB — PROTIME-INR
INR: 1 (ref 0.8–1.2)
Prothrombin Time: 14.2 s (ref 11.4–15.2)

## 2023-10-08 LAB — CBG MONITORING, ED: Glucose-Capillary: 78 mg/dL (ref 70–99)

## 2023-10-08 MED ORDER — LORATADINE 10 MG PO TABS: 10.0000 mg | ORAL_TABLET | Freq: Every day | ORAL | Status: DC | PRN

## 2023-10-08 MED ORDER — TRAZODONE HCL 50 MG PO TABS
150.0000 mg | ORAL_TABLET | Freq: Every day | ORAL | Status: DC
  Administered 2023-10-08: 150 mg via ORAL
  Filled 2023-10-08: qty 1

## 2023-10-08 MED ORDER — LOPERAMIDE HCL 2 MG PO CAPS: 2.0000 mg | ORAL_CAPSULE | Freq: Four times a day (QID) | ORAL | Status: DC | PRN

## 2023-10-08 MED ORDER — METOPROLOL SUCCINATE ER 25 MG PO TB24
12.5000 mg | ORAL_TABLET | Freq: Every day | ORAL | Status: DC
  Administered 2023-10-09: 12.5 mg via ORAL
  Filled 2023-10-08: qty 1

## 2023-10-08 MED ORDER — ASPIRIN 81 MG PO CHEW
81.0000 mg | CHEWABLE_TABLET | Freq: Every day | ORAL | Status: DC
  Administered 2023-10-08 – 2023-10-09 (×2): 81 mg via ORAL
  Filled 2023-10-08 (×2): qty 1

## 2023-10-08 MED ORDER — INSULIN ASPART 100 UNIT/ML IJ SOLN
0.0000 [IU] | Freq: Three times a day (TID) | INTRAMUSCULAR | Status: DC
  Administered 2023-10-09: 1 [IU] via SUBCUTANEOUS
  Administered 2023-10-09: 2 [IU] via SUBCUTANEOUS

## 2023-10-08 MED ORDER — SERTRALINE HCL 50 MG PO TABS
50.0000 mg | ORAL_TABLET | Freq: Every day | ORAL | Status: DC
  Administered 2023-10-09: 50 mg via ORAL
  Filled 2023-10-08: qty 1

## 2023-10-08 MED ORDER — ROSUVASTATIN CALCIUM 20 MG PO TABS
40.0000 mg | ORAL_TABLET | Freq: Every day | ORAL | Status: DC
  Administered 2023-10-09: 40 mg via ORAL
  Filled 2023-10-08: qty 2

## 2023-10-08 MED ORDER — POTASSIUM CHLORIDE CRYS ER 20 MEQ PO TBCR
40.0000 meq | EXTENDED_RELEASE_TABLET | Freq: Once | ORAL | Status: AC
  Administered 2023-10-08: 40 meq via ORAL
  Filled 2023-10-08: qty 2

## 2023-10-08 MED ORDER — STROKE: EARLY STAGES OF RECOVERY BOOK
Freq: Once | Status: AC
  Administered 2023-10-09: 1
  Filled 2023-10-08: qty 1

## 2023-10-08 MED ORDER — LORAZEPAM 2 MG/ML IJ SOLN
0.5000 mg | Freq: Once | INTRAMUSCULAR | Status: AC
  Administered 2023-10-09: 0.5 mg via INTRAVENOUS
  Filled 2023-10-08: qty 1

## 2023-10-08 MED ORDER — PANTOPRAZOLE SODIUM 40 MG PO TBEC
40.0000 mg | DELAYED_RELEASE_TABLET | Freq: Every day | ORAL | Status: DC
  Administered 2023-10-09: 40 mg via ORAL
  Filled 2023-10-08: qty 1

## 2023-10-08 MED ORDER — ACETAMINOPHEN 500 MG PO TABS
500.0000 mg | ORAL_TABLET | Freq: Four times a day (QID) | ORAL | Status: DC | PRN
  Filled 2023-10-08: qty 1

## 2023-10-08 NOTE — ED Notes (Signed)
Eating a sandwich

## 2023-10-08 NOTE — Progress Notes (Signed)
  Echocardiogram 2D Echocardiogram has been performed.  Troy Randall 10/08/2023, 3:41 PM

## 2023-10-08 NOTE — ED Provider Notes (Signed)
  EMERGENCY DEPARTMENT AT [facility] Provider Note   CSN: [id number] Arrival date & time: 10/08/23  1047     Patient presents with: Neurologic Problem   Troy Randall is a 63 y.o. male.    Neurologic Problem Pertinent negatives include no chest pain, no abdominal pain and no shortness of breath.    Patient presents with abnormal MRI brain.  Patient was sent in because of acute changes on his MRI brain.  According to wife at bedside, patient is acting about his neurobaseline.  No acute changes that she can really appreciate here recently.  He has not been acting different or talking different.  No recent fever or chills.  She does report possible UTIs back in August and September.  Last Foley catheter exchange was in September.  Scheduled follow back up with urology in October.  No nausea or vomiting.  No diarrhea.  No fevers.  According to wife, patient is on a statin.  Is not on any kind of anticoagulation including antiplatelets or other anticoagulation in the setting of patient's history of GI bleed.     Previous medical history reviewed : Last followed with neurology in June 2025. History of demyelinating disease and history of strokes. Neurology at that time felt that the encephalopathy he experienced in september of 2023 was due to septic emboli.    Prior to Admission medications   Medication Sig Start Date End Date Taking? Authorizing Provider  BD VEO INSULIN  SYRINGE U/F 31G X 15/64 1 ML MISC  USE AS DIRECTED 06/08/17   Rolinda Millman, MD  CRANBERRY PO Take 5 mLs by mouth daily.    [provider]  glucose blood (ONETOUCH VERIO) test strip TEST twice a day 05/04/17   Rolinda Millman, MD  Insulin  Pen Needle (NOVOTWIST) 32G X 5 MM MISC Use two daily to inject Victoza  and Toujeo . 04/25/15   Von Pacific, MD  levocetirizine (XYZAL) 5 MG tablet Take 5 mg by mouth daily as needed for allergies. 05/17/23   [provider]  loperamide (IMODIUM A-D) 2  MG tablet Take 2 mg by mouth 4 (four) times daily as needed for diarrhea or loose stools.    [provider]  metoprolol  succinate (TOPROL -XL) 25 MG 24 hr tablet Take 0.5 tablets (12.5 mg total) by mouth daily. 04/03/22   Samtani, Jai-Gurmukh, MD  [facility] DELICA LANCETS 33G MISC Use to check blood sugar once a day dx code E11.65 11/21/14   Von Pacific, MD  oxybutynin  (DITROPAN ) 5 MG tablet Take 1 tablet (5 mg total) by mouth every 8 (eight) hours as needed for bladder spasms. 03/30/23   Matilda Senior, MD  pantoprazole  (PROTONIX ) 40 MG tablet Take 1 tablet (40 mg total) by mouth daily. 11/27/21   Setzer, Sandra J, PA-C  potassium chloride  (KLOR-CON ) 10 MEQ tablet Take 10 mEq by mouth daily as needed (low potassium levels).    [provider]  Probiotic Product (PROBIOTIC PO) Take 1 tablet by mouth daily.    [provider]  rosuvastatin  (CRESTOR ) 40 MG tablet Take 1 tablet (40 mg total) by mouth daily. 04/29/21   Lue Elsie BROCKS, MD  sertraline (ZOLOFT) 50 MG tablet Take 50 mg by mouth daily. 07/16/23   [provider]  traZODone  (DESYREL ) 150 MG tablet Take 150 mg by mouth at bedtime. 07/23/23   [provider]  TRESIBA  FLEXTOUCH 200 UNIT/ML FlexTouch Pen Inject under skin 15 units twice a day. Patient taking differently: Inject 18 Units  into the skin daily as needed (blood glucose over 130). 11/26/21   Setzer, Sandra J, PA-C  TYLENOL  500 MG tablet Take 1 tablet (500 mg total) by mouth every 6 (six) hours as needed for mild pain or headache. 04/03/22   Samtani, Jai-Gurmukh, MD    Allergies: Bactrim  [sulfamethoxazole -trimethoprim ] and Amoxicillin     Review of Systems  Constitutional:  Negative for chills and fever.  HENT:  Negative for ear pain and sore throat.   Eyes:  Negative for pain and visual disturbance.  Respiratory:  Negative for cough and shortness of breath.   Cardiovascular:  Negative for chest pain and palpitations.   Gastrointestinal:  Negative for abdominal pain and vomiting.  Genitourinary:  Negative for dysuria and hematuria.  Musculoskeletal:  Negative for arthralgias and back pain.  Skin:  Negative for color change and rash.  Neurological:  Negative for seizures and syncope.  All other systems reviewed and are negative.   Updated Vital Signs BP (!) 173/103 (BP Location: Right Arm)   Pulse (!) 51   Temp 98 F (36.7 C) (Oral)   Resp (!) 22   Ht 6' (1.829 m)   Wt 83.9 kg   SpO2 100%   BMI 25.09 kg/m   Physical Exam Vitals and nursing note reviewed.  Constitutional:      General: He is not in acute distress.    Appearance: He is well-developed.  HENT:     Head: Normocephalic and atraumatic.  Eyes:     Conjunctiva/sclera: Conjunctivae normal.  Cardiovascular:     Rate and Rhythm: Normal rate and regular rhythm.     Heart sounds: No murmur heard. Pulmonary:     Effort: Pulmonary effort is normal. No respiratory distress.     Breath sounds: Normal breath sounds.  Abdominal:     Palpations: Abdomen is soft.     Tenderness: There is no abdominal tenderness.  Musculoskeletal:        General: No swelling.     Cervical back: Neck supple.  Skin:    General: Skin is warm and dry.     Capillary Refill: Capillary refill takes less than 2 seconds.  Neurological:     Mental Status: He is alert.     GCS: GCS eye subscore is 4. GCS verbal subscore is 5. GCS motor subscore is 6.     Comments: Patient is at mental baseline per wife at bedside.  Patient able to squeeze hands bilaterally.  Sensation intact in upper and lower extremities.  No facial droop.  No dysarthria or aphasia.  Patient alert to self, place.  Alert to time with some help.   Patient able to lift legs up off the bed against gravity and hold.  No pronator drift.  Psychiatric:        Mood and Affect: Mood normal.     (all labs ordered are listed, but only abnormal results are displayed) Labs Reviewed  COMPREHENSIVE  METABOLIC PANEL WITH GFR - Abnormal; Notable for the following components:      Result Value   Potassium 3.3 (*)    Glucose, Bld 170 (*)    BUN 6 (*)    Total Bilirubin 1.3 (*)    All other components within normal limits  CBC WITH DIFFERENTIAL/PLATELET  PROTIME-INR  APTT  HEMOGLOBIN A1C    EKG: EKG Interpretation Date/Time:  Friday October 08 2023 12:27:06 EDT Ventricular Rate:  75 PR Interval:  196 QRS Duration:  88 QT Interval:  400 QTC Calculation: 446  R Axis:   -13  Text Interpretation: Sinus rhythm with occasional Premature ventricular complexes Nonspecific T wave abnormality Abnormal ECG When compared with ECG of 07-Oct-2023 08:06, PREVIOUS ECG IS PRESENT Confirmed by Simon Rea [phone]) on 10/08/2023 12:47:56 PM  Radiology: MR BRAIN W WO CONTRAST Result Date: 10/07/2023 CLINICAL DATA:  Provided history: Gait disturbance. Septic encephalopathy. Encephalopathy. EXAM: MRI HEAD WITHOUT AND WITH CONTRAST TECHNIQUE: Multiplanar, multiecho pulse sequences of the brain and surrounding structures were obtained without and with intravenous contrast. CONTRAST:  8mL GADAVIST  GADOBUTROL  1 MMOL/ML IV SOLN COMPARISON:  Prior brain MRI examinations 04/01/2022 and earlier. FINDINGS: Brain: Mild-to-moderate generalized cerebral atrophy. Mild cerebellar atrophy. Foci of restricted diffusion within the right corona radiata (series 4, image 34), right caudate nucleus (series 4, image 33) and left periatrial white matter (series 4, images 30 and 31). The largest focus is located within the right corona radiata, measuring 7 mm. Similar to the prior MRI of 04/01/2022, there is advanced cerebral white matter disease with numerous chronic insults in the bilateral cerebral hemispheric white matter and to a lesser degree within/about the bilateral basal ganglia and within the pons. Chronic insults within the bilateral thalami, new from the prior MRI. Small chronic infarct within the left cerebellar  hemisphere, unchanged. Few chronic microhemorrhages scattered within the supratentorial brain, increased in number from the prior MRI. No evidence of an intracranial mass. No extra-axial fluid collection. No midline shift. No pathologic intracranial enhancement identified. Vascular: Maintained flow voids within the proximal large arterial vessels. Left cerebellar developmental venous anomaly (anatomic variant). Skull and upper cervical spine: No focal worrisome marrow lesion. Sinuses/Orbits: No mass or acute finding within the imaged orbits. No significant paranasal sinus disease. Impression #1 will be called to the ordering clinician or representative by the Radiologist Assistant, and communication documented in the PACS or [employer]. IMPRESSION: 1. Four small acute infarcts within the right corona radiata, right caudate nucleus and left periatrial white matter (measuring up to 7 mm). 2. Numerous chronic insults within the bilateral cerebral hemispheric white matter and to a lesser degree within/about the bilateral basal ganglia and within the pons. Background advanced cerebral white matter disease. These findings are similar to the prior MRI of 04/01/2022. 3. Chronic insults within the bilateral thalami, new from the prior MRI. 4. Small chronic infarct within the left cerebellar hemisphere, unchanged. 5. Mild-to-moderate generalized cerebral atrophy. 6. Mild cerebellar atrophy. Electronically Signed   By: Rockey Childs D.O.   On: 10/07/2023 12:00     Procedures   Medications Ordered in the ED   stroke: early stages of recovery book (has no administration in time range)    Clinical Course as of 10/08/23 1538  Fri Oct 08, 2023  1239 IMPRESSION: 1. Four small acute infarcts within the right corona radiata, right caudate nucleus and left periatrial white matter (measuring up to 7 mm). 2. Numerous chronic insults within the bilateral cerebral hemispheric white matter and to a lesser degree  within/about the bilateral basal ganglia and within the pons. Background advanced cerebral white matter disease. These findings are similar to the prior MRI of 04/01/2022. 3. Chronic insults within the bilateral thalami, new from the prior MRI. 4. Small chronic infarct within the left cerebellar hemisphere, unchanged. 5. Mild-to-moderate generalized cerebral atrophy. 6. Mild cerebellar atrophy.  [TL]    Clinical Course User Index [TL] Simon Rea SAILOR, MD  Medical Decision Making Amount and/or Complexity of Data Reviewed Labs: ordered.      Patient presents with abnormal MRI brain.  Patient was sent in because of acute changes on his MRI brain.  According to wife at bedside, patient is acting about his neurobaseline.  No acute changes that she can really appreciate here recently.  He has not been acting different or talking different.  No recent fever or chills.  She does report possible UTIs back in August and September.  Last Foley catheter exchange was in September.  Scheduled follow back up with urology in October.  No nausea or vomiting.  No diarrhea.  No fevers.  According to wife, patient is on a statin.  Is not on any kind of anticoagulation including antiplatelets or other anticoagulation in the setting of patient's history of GI bleed.     Previous medical history reviewed : Last followed with neurology in June 2025. History of demyelinating disease and history of strokes. Neurology at that time felt that the encephalopathy he experienced in september of 2023 was due to septic emboli   Exam as follows: Patient is at mental baseline per wife at bedside.  Patient able to squeeze hands bilaterally.  Sensation intact in upper and lower extremities.  No facial droop.  No dysarthria or aphasia.  Patient alert to self, place.  Alert to time with some help.   Patient able to lift legs up off the bed against gravity and hold.  No pronator  drift.  No new focal deficit.  Unclear whenever the CVAs could have happened.  Patient is unfortunate not a good candidate for anticoagulation in setting of patient's history of GI bleed.  Patient is on a statin.  No history of any arrhythmia.  Will need cardiac telemetry.  Last vessel imaging was in 2023.   No concerns of any kind of septic emboli this point time soft benign abdomen.  No rebound guarding or tenderness.  Afebrile.  Spoke to neurology.  They will see the patient.  They will comment on vessel imaging.   Patient be admitted for further care.        Final diagnoses:  Cerebrovascular accident (CVA), unspecified mechanism [facility])    ED Discharge Orders     None          Simon Lavonia SAILOR, MD 10/08/23 1538

## 2023-10-08 NOTE — H&P (Addendum)
 Date: 10/08/2023               Patient Name:  Troy Randall MRN: [id number]  DOB: 17-Jan-1960 Age / Sex: 63 y.o., male   PCP: [name], MD         Medical Service: Internal Medicine Teaching Service         Attending Physician: Dr. Jone Dauphin      First Contact: Melvenia Morrison, MD}    Second Contact: Dr. Norman Lobstein, DO         Pager Information: First Contact Pager: [phone]   Second Contact Pager: [phone]   SUBJECTIVE   Chief Complaint:   History of Present Illness: Troy Randall is a 63 y.o. male with PMH of hypertension, hyperlipidemia, T2DM, ischemic stroke, recurrent lower GI bleeding s/p coil embolization, recurrent UTIs s/p suprapubic catheter who presented for an abnormal MRI and was admitted for an acute ischemic stroke.  In April 2023, patient was admitted for slurred speech. MRI found an acute small left frontal subcortical infarct with additional small chronic infarcts and chronic microvascular ischemic changes. Carotid Ultrasound showed minimal to no stenosis. Symptoms self-resolved and patient was discharged on Aspirin , Plavix  and Statin.  He was re-admitted in August 2023 for a GI bleed. CTA, colonoscopy and EGD unremarkable for active bleed. He underwent a small bowel enteroscopy which noted intermitted blood pumping in the proximal jejunum but was unable to be reached and was eventually injected with a clip placed. Repeat CTA demonstrated positive jejunal diverticular bleeding. IR performed a successful coil embolization in a SMA territory, jejunal arcade branch. The next day, code was called and patient was found hypotensive, tachycardic with decreased responsiveness. Hgb returned at 5 and rebleed was suspected. No evidence of active GI bleeding was found on CTA, however gallbladder demonstrated acute cholecystitis and IR placed percutaneous colycystotomy tube. Suspected to be combination of septic and hemorrhagic shock. Patient became encephalopathic and MRI  demonstrated rapid development of numerous round white matter lesions in both cerebral hemispheres, likely septic emboli per neurology. 5 day IV steroid course did not improve clinical condition and was downgraded from ICU. Patient underwent plasma exchange per neurology and was d/c on 10/26. GI bleeding started again and sigmoidoscopy found an ulcer. Given patient's current and previous GI bleeds, DAPT was discontinued on 10/26. Patient to remove foley cathetar on 11/02 and was found to have E coli bacteriemia and Klebsiella in her urine. Was hospitalized and treated with antibiotics. Percutaneous Cholecystotomy drain was removed on 5/23. Patient continued to have recurrent UTI's with urinary retention. Suprapubic catheter was placed on 7/18.   In the ER, wife helped with history as patient is confused and responds briefly. Per the wife, patient was undergoing MR Brain on 10/02 for routine monitoring of aforementioned lesions and was told to come in on 10/03 as an acute infarct was noted - MRI demonstrated four small acute infracts within right corona radiata, right caudate nucleus and left periatrial white matter with numerous chronic insults. Per wife, patient is at his baseline with no acute changes. She mentions that patient has gotten more agitated and restless over the past few months, but no acute decline. She reports UTIs back in August and September which were treated with Ciprofloxacin, completed course. Denies any recent fevers, chills, abdominal pain, weakness, HA, visual changes, chest pain, palpitations. No outpatient medications have been marked as taking for the 10/08/23 encounter [facility] Encounter).    Past Medical History Active Ambulatory Problems  Diagnosis Date Noted   DM2 (diabetes mellitus, type 2) (HCC) 06/19/2015   Chronic right-sided low back pain with sciatica 07/10/2015   Essential hypertension 11/28/2013   Hyperlipidemia 11/29/2013   Cannot sleep 08/26/2015   Acute CVA  (cerebrovascular accident) (HCC) 04/28/2021   Chronic respiratory failure with hypoxia (HCC)    Tracheostomy status (HCC)    Acute metabolic encephalopathy    Anemia due to blood loss 09/18/2021   History of lower GI bleeding: 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  09/18/2021   History of acute cholecystitis 09/18/2021   Acute emphysematous cholecystitis 09/18/2021   Pressure injury of skin 10/07/2021   Acute respiratory insufficiency 10/23/2021   SVT (supraventricular tachycardia) 11/06/2021   Acute cystitis 11/07/2021   Hypomagnesemia 11/07/2021   Class 2 obesity 11/07/2021   Sepsis due to gram-negative UTI (HCC) 11/07/2021   Anemia of chronic disease 11/11/2021   Debility 11/12/2021   Acute disseminated encephalomyelitis 10/30/2021   History of encephalopathy 12/10/2021   Gait disturbance 12/10/2021   History of sepsis 02/23/2022   Drug rash 03/30/2022   Neurogenic bladder 03/31/2022   Cerebrovascular disease 03/31/2022   Hyponatremia 03/31/2022   Biliary drain displacement 01/17/2023   Hypokalemia 01/17/2023   Sepsis (HCC) 01/17/2023   Resolved Ambulatory Problems    Diagnosis Date Noted   Lower GI bleed 09/04/2021   Acute blood loss anemia    Hemorrhagic shock (HCC)    Shock circulatory (HCC)    E coli infection 09/18/2021   Past Medical History:  Diagnosis Date   Anemia    Arthritis    CVA (cerebral vascular accident) (HCC)    Diabetes mellitus without complication (HCC)    Hypertension    Kidney stones      Past Surgical History Past Surgical History:  Procedure Laterality Date   ANKLE SURGERY     BIOPSY  09/10/2021   Procedure: BIOPSY;  Surgeon: Rollin Dover, MD;  Location: [facility] ENDOSCOPY;  Service: Gastroenterology;;   BIOPSY  10/07/2021   Procedure: BIOPSY;  Surgeon: Rollin Dover, MD;  Location: [facility] ENDOSCOPY;  Service: Gastroenterology;;   COLONOSCOPY N/A 10/07/2021   Procedure: COLONOSCOPY;  Surgeon: Rollin Dover, MD;  Location: [facility]  ENDOSCOPY;  Service: Gastroenterology;  Laterality: N/A;   COLONOSCOPY WITH PROPOFOL  N/A 09/06/2021   Procedure: COLONOSCOPY WITH PROPOFOL ;  Surgeon: Eartha Angelia Sieving, MD;  Location: [facility] ENDO SUITE;  Service: Gastroenterology;  Laterality: N/A;   CYSTOSCOPY/URETEROSCOPY/HOLMIUM LASER/STENT PLACEMENT Left 03/30/2022   Procedure: CYSTOSCOPY/RETROGRADE/URETEROSCOPY/HOLMIUM LASER/STENT PLACEMENT;  Surgeon: Devere Lonni Righter, MD;  Location: [facility] ORS;  Service: Urology;  Laterality: Left;   ENTEROSCOPY N/A 09/10/2021   Procedure: ENTEROSCOPY;  Surgeon: Rollin Dover, MD;  Location: [facility] ENDOSCOPY;  Service: Gastroenterology;  Laterality: N/A;   ENTEROSCOPY  09/06/2021   Procedure: ENTEROSCOPY;  Surgeon: Eartha Angelia Sieving, MD;  Location: [facility] ENDO SUITE;  Service: Gastroenterology;;   ESOPHAGOGASTRODUODENOSCOPY (EGD) WITH PROPOFOL   09/06/2021   Procedure: ESOPHAGOGASTRODUODENOSCOPY (EGD) WITH PROPOFOL ;  Surgeon: Eartha Angelia Sieving, MD;  Location: [facility] ENDO SUITE;  Service: Gastroenterology;;   ENID SIGMOIDOSCOPY N/A 09/19/2021   Procedure: ENID MORIN;  Surgeon: Rollin Dover, MD;  Location: [facility] ENDOSCOPY;  Service: Gastroenterology;  Laterality: N/A;   FOREIGN BODY REMOVAL  09/19/2021   Procedure: FOREIGN BODY REMOVAL;  Surgeon: Rollin Dover, MD;  Location: [facility] ENDOSCOPY;  Service: Gastroenterology;;   KRISTENE CAPSULE STUDY  09/06/2021   Procedure: GIVENS CAPSULE STUDY;  Surgeon: Eartha Angelia Sieving, MD;  Location: [facility] ENDO SUITE;  Service: Gastroenterology;;  HERNIA REPAIR     umbilical x1 Incisional x1   INCISIONAL HERNIA REPAIR  04/13/2011   Procedure: LAPAROSCOPIC INCISIONAL HERNIA;  Surgeon: Oneil DELENA Budge, MD;  Location: [facility] ORS;  Service: General;  Laterality: N/A;  Recurrent Laparoscopic Incisional Herniorraphy with Mesh   IR ANGIOGRAM SELECTIVE EACH ADDITIONAL VESSEL  09/09/2021   IR ANGIOGRAM VISCERAL SELECTIVE  09/07/2021   IR ANGIOGRAM VISCERAL SELECTIVE  09/06/2021    IR ANGIOGRAM VISCERAL SELECTIVE  09/06/2021   IR CHOLANGIOGRAM EXISTING TUBE  11/06/2021   IR CHOLANGIOGRAM EXISTING TUBE  02/12/2023   IR CHOLANGIOGRAM EXISTING TUBE  05/28/2023   IR CYSTOSTOMY TUBE PLACEMENT/BLADDER ASPIRATION  07/23/2023   IR EMBO ART  VEN HEMORR LYMPH EXTRAV  INC GUIDE ROADMAPPING  09/06/2021   IR EXCHANGE BILIARY DRAIN  10/24/2021   IR EXCHANGE BILIARY DRAIN  11/19/2021   IR EXCHANGE BILIARY DRAIN  01/19/2022   IR EXCHANGE BILIARY DRAIN  02/19/2022   IR EXCHANGE BILIARY DRAIN  04/01/2022   IR EXCHANGE BILIARY DRAIN  05/27/2022   IR EXCHANGE BILIARY DRAIN  07/24/2022   IR EXCHANGE BILIARY DRAIN  09/24/2022   IR EXCHANGE BILIARY DRAIN  11/19/2022   IR EXCHANGE BILIARY DRAIN  01/18/2023   IR EXCHANGE BILIARY DRAIN  01/19/2023   IR EXCHANGE BILIARY DRAIN  04/16/2023   IR GUIDED DRAIN W CATHETER PLACEMENT  09/07/2021   IR REMOVAL OF CALCULI/DEBRIS BILIARY DUCT/GB  01/19/2023   IR US  GUIDE BX ASP/DRAIN  09/07/2021   IR US  GUIDE VASC ACCESS RIGHT  09/07/2021   IR US  GUIDE VASC ACCESS RIGHT  09/06/2021   KIDNEY STONE SURGERY     RADIOLOGY WITH ANESTHESIA N/A 03/10/2022   Procedure: MRI WITH ANESTHESIA OF BRAIN WITH AND WITHOUT CONTRAST;  Surgeon: Radiologist, Medication, MD;  Location: [facility] OR;  Service: Radiology;  Laterality: N/A;   RADIOLOGY WITH ANESTHESIA N/A 10/07/2023   Procedure: MRI WITH ANESTHESIA;  Surgeon: Radiologist, Medication, MD;  Location: [facility] OR;  Service: Radiology;  Laterality: N/A;  MRI OF BRAIN WITH AND WITHOUT CONTRAST     Social:  Lives With: Wife Occupation: Retired, 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 Support: Requires support from wife for all ADLs Level of Function: Dependent on Wife  PCP: [name], MD  Substances: -Tobacco: None -Alcohol: None -Recreational Drug: None  Family History:  Family History  Problem Relation Age of Onset   Heart failure Mother    Diabetes Father    Cancer Other    Heart attack Other    Anesthesia problems Neg Hx    Hypotension Neg Hx     Malignant hyperthermia Neg Hx    Pseudochol deficiency Neg Hx    Colon cancer Neg Hx    Inflammatory bowel disease Neg Hx      Allergies: Allergies as of 10/08/2023 - Review Complete 10/08/2023  Allergen Reaction Noted   Bactrim  [sulfamethoxazole -trimethoprim ] Rash 03/29/2022   Amoxicillin  Hives 03/05/2022    Review of Systems: A complete ROS was negative except as per HPI.   OBJECTIVE:   Physical Exam: Blood pressure (!) 186/91, pulse (!) 51, temperature 97.9 F (36.6 C), resp. rate 19, height 6' (1.829 m), weight 83.9 kg, SpO2 100%.  Constitutional: awake, resting in bed, in no acute distress. Minimally interactive with limited verbal responses. HENT: normocephalic atraumatic, mucous membranes moist Eyes: conjunctiva non-erythematous Neck: supple Cardiovascular: regular rate and rhythm, no m/r/g Pulmonary/Chest: normal work of breathing on room air, lungs clear to auscultation bilaterally Abdominal: soft, non-tender, non-distended Neurological:  alert & oriented x 3, patient able to squeeze hands bilaterally, lift legs up against pressure, sensation intact in upper and lower extremities. Normal cranial nerve exam (2-12).   Skin: warm and dry Psych: Mood normal  Labs: CBC    Component Value Date/Time   WBC 7.3 10/08/2023 1150   RBC 5.39 10/08/2023 1150   HGB 15.3 10/08/2023 1150   HCT 45.0 10/08/2023 1150   PLT 175 10/08/2023 1150   MCV 83.5 10/08/2023 1150   MCH 28.4 10/08/2023 1150   MCHC 34.0 10/08/2023 1150   RDW 12.7 10/08/2023 1150   LYMPHSABS 1.1 10/08/2023 1150   MONOABS 0.3 10/08/2023 1150   EOSABS 0.1 10/08/2023 1150   BASOSABS 0.1 10/08/2023 1150     CMP     Component Value Date/Time   NA 139 10/08/2023 1150   K 3.3 (L) 10/08/2023 1150   CL 103 10/08/2023 1150   CO2 26 10/08/2023 1150   GLUCOSE 170 (H) 10/08/2023 1150   BUN 6 (L) 10/08/2023 1150   CREATININE 0.81 10/08/2023 1150   CREATININE 0.90 03/31/2017 1625   CALCIUM  8.9 10/08/2023 1150    PROT 7.5 10/08/2023 1150   ALBUMIN  3.8 10/08/2023 1150   ALBUMIN  1.9 (L) 09/22/2021 1348   AST 32 10/08/2023 1150   ALT 29 10/08/2023 1150   ALKPHOS 87 10/08/2023 1150   BILITOT 1.3 (H) 10/08/2023 1150   GFRNONAA >60 10/08/2023 1150   GFRAA >90 04/07/2011 0927    Imaging:  ECHOCARDIOGRAM COMPLETE Result Date: 10/08/2023    ECHOCARDIOGRAM REPORT   Patient Name:   DARUS HERSHMAN Date of Exam: 10/08/2023 Medical Rec #:  [id number]       Height:       72.0 in Accession #:    [id number]      Weight:       185.0 lb Date of Birth:  1960-02-16       BSA:          2.061 m Patient Age:    63 years        BP:           161/99 mmHg Patient Gender: M               HR:           53 bpm. Exam Location:  Inpatient Procedure: 2D Echo and Saline Contrast Bubble Study (Both Spectral and Color            Flow Doppler were utilized during procedure). Indications:    Stroke  History:        Patient has prior history of Echocardiogram examinations.                 Stroke; Risk Factors:Diabetes and Hypertension.  Sonographer:    Norleen Amour Referring Phys: [id number] DEVON SHAFER IMPRESSIONS  1. Left ventricular ejection fraction, by estimation, is 60 to 65%. The left ventricle has normal function. The left ventricle has no regional wall motion abnormalities. Left ventricular diastolic parameters are indeterminate.  2. Right ventricular systolic function is normal. The right ventricular size is normal.  3. The mitral valve is normal in structure. Trivial mitral valve regurgitation.  4. The aortic valve is tricuspid. Aortic valve regurgitation is not visualized. Aortic valve sclerosis/calcification is present, without any evidence of aortic stenosis.  5. Aortic dilatation noted. There is mild dilatation of the ascending aorta, measuring 41 mm.  6. Agitated saline contrast bubble study was negative, with no evidence of any  interatrial shunt. FINDINGS  Left Ventricle: Left ventricular ejection fraction, by estimation, is 60 to  65%. The left ventricle has normal function. The left ventricle has no regional wall motion abnormalities. The left ventricular internal cavity size was normal in size. There is  no left ventricular hypertrophy. Left ventricular diastolic parameters are indeterminate. Right Ventricle: The right ventricular size is normal. Right vetricular wall thickness was not assessed. Right ventricular systolic function is normal. Left Atrium: Left atrial size was normal in size. Right Atrium: Right atrial size was normal in size. Pericardium: There is no evidence of pericardial effusion. Mitral Valve: The mitral valve is normal in structure. Trivial mitral valve regurgitation. Tricuspid Valve: The tricuspid valve is normal in structure. Tricuspid valve regurgitation is trivial. Aortic Valve: The aortic valve is tricuspid. Aortic valve regurgitation is not visualized. Aortic valve sclerosis/calcification is present, without any evidence of aortic stenosis. Pulmonic Valve: The pulmonic valve was grossly normal. Pulmonic valve regurgitation is not visualized. Aorta: The aortic root is normal in size and structure and aortic dilatation noted. There is mild dilatation of the ascending aorta, measuring 41 mm. IAS/Shunts: No atrial level shunt detected by color flow Doppler. Agitated saline contrast was given intravenously to evaluate for intracardiac shunting. Agitated saline contrast bubble study was negative, with no evidence of any interatrial shunt.  LEFT VENTRICLE PLAX 2D LVIDd:         5.10 cm     Diastology LVIDs:         3.20 cm     LV e' medial:    6.20 cm/s LV PW:         0.90 cm     LV E/e' medial:  7.6 LV IVS:        1.10 cm     LV e' lateral:   7.18 cm/s LVOT diam:     2.00 cm     LV E/e' lateral: 6.6 LV SV:         54 LV SV Index:   26 LVOT Area:     3.14 cm  LV Volumes (MOD) LV vol d, MOD A2C: 65.8 ml LV vol d, MOD A4C: 89.5 ml LV vol s, MOD A2C: 23.9 ml LV vol s, MOD A4C: 25.5 ml LV SV MOD A2C:     41.9 ml LV SV MOD  A4C:     89.5 ml LV SV MOD BP:      51.6 ml RIGHT VENTRICLE RV Basal diam:  2.70 cm RV S prime:     10.20 cm/s TAPSE (M-mode): 2.5 cm LEFT ATRIUM             Index        RIGHT ATRIUM          Index LA diam:        3.60 cm 1.75 cm/m   RA Area:     7.61 cm LA Vol (A2C):   31.5 ml 15.28 ml/m  RA Volume:   11.50 ml 5.58 ml/m LA Vol (A4C):   27.9 ml 13.54 ml/m LA Biplane Vol: 32.5 ml 15.77 ml/m  AORTIC VALVE             PULMONIC VALVE LVOT Vmax:   66.30 cm/s  PV Vmax:       0.71 m/s LVOT Vmean:  41.000 cm/s PV Peak grad:  2.0 mmHg LVOT VTI:    0.171 m  AORTA Ao Root diam: 3.70 cm Ao Asc diam:  4.10 cm MITRAL VALVE MV Area (  PHT): 2.51 cm    SHUNTS MV Decel Time: 302 msec    Systemic VTI:  0.17 m MV E velocity: 47.20 cm/s  Systemic Diam: 2.00 cm MV A velocity: 60.30 cm/s MV E/A ratio:  0.78 Vina Gull MD Electronically signed by Vina Gull MD Signature Date/Time: 10/08/2023/6:00:57 PM    Final    MR BRAIN W WO CONTRAST Result Date: 10/07/2023 CLINICAL DATA:  Provided history: Gait disturbance. Septic encephalopathy. Encephalopathy. EXAM: MRI HEAD WITHOUT AND WITH CONTRAST TECHNIQUE: Multiplanar, multiecho pulse sequences of the brain and surrounding structures were obtained without and with intravenous contrast. CONTRAST:  8mL GADAVIST  GADOBUTROL  1 MMOL/ML IV SOLN COMPARISON:  Prior brain MRI examinations 04/01/2022 and earlier. FINDINGS: Brain: Mild-to-moderate generalized cerebral atrophy. Mild cerebellar atrophy. Foci of restricted diffusion within the right corona radiata (series 4, image 34), right caudate nucleus (series 4, image 33) and left periatrial white matter (series 4, images 30 and 31). The largest focus is located within the right corona radiata, measuring 7 mm. Similar to the prior MRI of 04/01/2022, there is advanced cerebral white matter disease with numerous chronic insults in the bilateral cerebral hemispheric white matter and to a lesser degree within/about the bilateral basal ganglia and  within the pons. Chronic insults within the bilateral thalami, new from the prior MRI. Small chronic infarct within the left cerebellar hemisphere, unchanged. Few chronic microhemorrhages scattered within the supratentorial brain, increased in number from the prior MRI. No evidence of an intracranial mass. No extra-axial fluid collection. No midline shift. No pathologic intracranial enhancement identified. Vascular: Maintained flow voids within the proximal large arterial vessels. Left cerebellar developmental venous anomaly (anatomic variant). Skull and upper cervical spine: No focal worrisome marrow lesion. Sinuses/Orbits: No mass or acute finding within the imaged orbits. No significant paranasal sinus disease. Impression #1 will be called to the ordering clinician or representative by the Radiologist Assistant, and communication documented in the PACS or [employer]. IMPRESSION: 1. Four small acute infarcts within the right corona radiata, right caudate nucleus and left periatrial white matter (measuring up to 7 mm). 2. Numerous chronic insults within the bilateral cerebral hemispheric white matter and to a lesser degree within/about the bilateral basal ganglia and within the pons. Background advanced cerebral white matter disease. These findings are similar to the prior MRI of 04/01/2022. 3. Chronic insults within the bilateral thalami, new from the prior MRI. 4. Small chronic infarct within the left cerebellar hemisphere, unchanged. 5. Mild-to-moderate generalized cerebral atrophy. 6. Mild cerebellar atrophy. Electronically Signed   By: Rockey Childs D.O.   On: 10/07/2023 12:00     ASSESSMENT & PLAN:   Assessment & Plan by Problem: Principal Problem:   Stroke (HCC) Active Problems:   DM2 (diabetes mellitus, type 2) (HCC)   Hyperlipidemia   AGOSTINO GORIN is a 63 y.o. male with PMH of hypertension, hyperlipidemia, T2DM, prior ischemic stroke, recurrent GI bleeding s/p coil embolization,  recurrent UTIs s/p suprapubic catheter who presented for an abnormal MRI and was admitted for an acute ischemic stroke.  Acute ischemic stroke, right corona radiata, right, caudate nucleus and left periatrial white matter Unclear exactly when these occurred but thankfully patient at baseline. No focal deficit, mild receptive aphasia at times. All normal according to wife. Workup pending. - Neurology consulted, appreciate recommendations which are outlined below: - CT Angio Head and Neck ordered - Echocardiogram ordered - Resume Home Crestor  - ASA 81 mg daily, holding off on DAPT given recurrent GI bleed - HgbA1c, fasting  lipid panel  - Frequent neuro checks  - PT/OT  Essential Hypertension Given unclear timeline of stroke and with patient being at baseline, will resume antihypertensives -Resume patient home Metoprolol  Succinate 12.5 mg once daily  Hypokalemia 3.3, Repleted. -Trend BMP  T2DM Takes Tresiba  18 units in the morning when CBG is > 130. Only needs it a few days/week. Last A1c was 6.6 03/2022. -Sensitive SSI -Repeat A1c  Hyperlipidemia -Resume Rosuvastatin  40 mg daily  Constipation Patient with history of constipation on Loperamide -Start Loperamide PRN  GERD -Resume home Pantoporazole 40 mg   Anxiety/Depression/Sleep Troubles -Resume home Zoloft and Trazodone   Best practice: Diet: Normal VTE: SCD's IVF: None,None Code: Full  Disposition planning: Prior to Admission Living Arrangement: Home, living with wife Anticipated Discharge Location: Home  Dispo: Admit patient to Observation with expected length of stay less than 2 midnights.  Signed: Marylu Gee, DO MS4 10/08/2023, 9:17 PM   Attestation for Student Documentation:  I personally was present and re-performed the history, physical exam and medical decision-making activities of this service and have verified that the service and findings are accurately documented in the student's note.  Marylu Gee, DO 10/08/2023, 9:17 PM

## 2023-10-08 NOTE — ED Notes (Signed)
 Report to rn on 3w

## 2023-10-08 NOTE — Consult Note (Signed)
 NEUROLOGY CONSULT NOTE   Date of service: October 08, 2023 Patient Name: Troy Randall MRN:  [id number] DOB:  [birth year] Chief Complaint: stroke on MRI Requesting Provider: Simon Lavonia SAILOR, MD  History of Present Illness  Troy Randall is a 63 y.o. male with hx of multiple prior CVAs, most recent (04/2021), anemia, arthritis, diabetes, hyperlipidemia, hypertension, kidney stones, cholecystitis, UTI, suprapubic foley, GI bleed,  who presents to ED after results of routine MRI showed  Four small acute infarcts within the right corona radiata, right caudate nucleus and left periatrial white matter (measuring up to 7 mm). He is followed by Dr. Vear at [facility]. Patient is present with his spouse who states patient seems to be at baseline and was not having any symptoms recently. Patient denies any headache, changes in vision, weakness or numbness/tingling sensation. He is not on any anticoagulation therapies due to history of a GI bleed. Patient denies any GI bleeding, or bleeding otherwise at this time.  ROS  Comprehensive ROS performed and pertinent positives documented in HPI    Past History   Past Medical History:  Diagnosis Date   Anemia    Arthritis    CVA (cerebral vascular accident) (HCC)    last CVA 04/2021   Diabetes mellitus without complication (HCC)    type 2   Hyperlipidemia    Hypertension    Kidney stones     Past Surgical History:  Procedure Laterality Date   ANKLE SURGERY     BIOPSY  09/10/2021   Procedure: BIOPSY;  Surgeon: Rollin Dover, MD;  Location: [facility] ENDOSCOPY;  Service: Gastroenterology;;   BIOPSY  10/07/2021   Procedure: BIOPSY;  Surgeon: Rollin Dover, MD;  Location: [facility] ENDOSCOPY;  Service: Gastroenterology;;   COLONOSCOPY N/A 10/07/2021   Procedure: COLONOSCOPY;  Surgeon: Rollin Dover, MD;  Location: [facility] ENDOSCOPY;  Service: Gastroenterology;  Laterality: N/A;   COLONOSCOPY WITH PROPOFOL  N/A 09/06/2021   Procedure: COLONOSCOPY WITH PROPOFOL ;  Surgeon:  Eartha Angelia Sieving, MD;  Location: [facility] ENDO SUITE;  Service: Gastroenterology;  Laterality: N/A;   CYSTOSCOPY/URETEROSCOPY/HOLMIUM LASER/STENT PLACEMENT Left 03/30/2022   Procedure: CYSTOSCOPY/RETROGRADE/URETEROSCOPY/HOLMIUM LASER/STENT PLACEMENT;  Surgeon: Devere Lonni Righter, MD;  Location: [facility] ORS;  Service: Urology;  Laterality: Left;   ENTEROSCOPY N/A 09/10/2021   Procedure: ENTEROSCOPY;  Surgeon: Rollin Dover, MD;  Location: [facility] ENDOSCOPY;  Service: Gastroenterology;  Laterality: N/A;   ENTEROSCOPY  09/06/2021   Procedure: ENTEROSCOPY;  Surgeon: Eartha Angelia Sieving, MD;  Location: [facility] ENDO SUITE;  Service: Gastroenterology;;   ESOPHAGOGASTRODUODENOSCOPY (EGD) WITH PROPOFOL   09/06/2021   Procedure: ESOPHAGOGASTRODUODENOSCOPY (EGD) WITH PROPOFOL ;  Surgeon: Eartha Angelia Sieving, MD;  Location: [facility] ENDO SUITE;  Service: Gastroenterology;;   ENID SIGMOIDOSCOPY N/A 09/19/2021   Procedure: ENID MORIN;  Surgeon: Rollin Dover, MD;  Location: [facility] ENDOSCOPY;  Service: Gastroenterology;  Laterality: N/A;   FOREIGN BODY REMOVAL  09/19/2021   Procedure: FOREIGN BODY REMOVAL;  Surgeon: Rollin Dover, MD;  Location: [facility] ENDOSCOPY;  Service: Gastroenterology;;   KRISTENE CAPSULE STUDY  09/06/2021   Procedure: GIVENS CAPSULE STUDY;  Surgeon: Eartha Angelia Sieving, MD;  Location: [facility] ENDO SUITE;  Service: Gastroenterology;;   HERNIA REPAIR     umbilical x1 Incisional x1   INCISIONAL HERNIA REPAIR  04/13/2011   Procedure: LAPAROSCOPIC INCISIONAL HERNIA;  Surgeon: Oneil DELENA Budge, MD;  Location: [facility] ORS;  Service: General;  Laterality: N/A;  Recurrent Laparoscopic Incisional Herniorraphy with Mesh   IR ANGIOGRAM SELECTIVE EACH ADDITIONAL VESSEL  09/09/2021   IR ANGIOGRAM VISCERAL SELECTIVE  09/07/2021   IR ANGIOGRAM VISCERAL SELECTIVE  09/06/2021   IR ANGIOGRAM VISCERAL SELECTIVE  09/06/2021   IR CHOLANGIOGRAM EXISTING TUBE  11/06/2021   IR CHOLANGIOGRAM EXISTING TUBE  02/12/2023   IR  CHOLANGIOGRAM EXISTING TUBE  05/28/2023   IR CYSTOSTOMY TUBE PLACEMENT/BLADDER ASPIRATION  07/23/2023   IR EMBO ART  VEN HEMORR LYMPH EXTRAV  INC GUIDE ROADMAPPING  09/06/2021   IR EXCHANGE BILIARY DRAIN  10/24/2021   IR EXCHANGE BILIARY DRAIN  11/19/2021   IR EXCHANGE BILIARY DRAIN  01/19/2022   IR EXCHANGE BILIARY DRAIN  02/19/2022   IR EXCHANGE BILIARY DRAIN  04/01/2022   IR EXCHANGE BILIARY DRAIN  05/27/2022   IR EXCHANGE BILIARY DRAIN  07/24/2022   IR EXCHANGE BILIARY DRAIN  09/24/2022   IR EXCHANGE BILIARY DRAIN  11/19/2022   IR EXCHANGE BILIARY DRAIN  01/18/2023   IR EXCHANGE BILIARY DRAIN  01/19/2023   IR EXCHANGE BILIARY DRAIN  04/16/2023   IR GUIDED DRAIN W CATHETER PLACEMENT  09/07/2021   IR REMOVAL OF CALCULI/DEBRIS BILIARY DUCT/GB  01/19/2023   IR US  GUIDE BX ASP/DRAIN  09/07/2021   IR US  GUIDE VASC ACCESS RIGHT  09/07/2021   IR US  GUIDE VASC ACCESS RIGHT  09/06/2021   KIDNEY STONE SURGERY     RADIOLOGY WITH ANESTHESIA N/A 03/10/2022   Procedure: MRI WITH ANESTHESIA OF BRAIN WITH AND WITHOUT CONTRAST;  Surgeon: Radiologist, Medication, MD;  Location: [facility] OR;  Service: Radiology;  Laterality: N/A;   RADIOLOGY WITH ANESTHESIA N/A 10/07/2023   Procedure: MRI WITH ANESTHESIA;  Surgeon: Radiologist, Medication, MD;  Location: [facility] OR;  Service: Radiology;  Laterality: N/A;  MRI OF BRAIN WITH AND WITHOUT CONTRAST    Family History: Family History  Problem Relation Age of Onset   Heart failure Mother    Diabetes Father    Cancer Other    Heart attack Other    Anesthesia problems Neg Hx    Hypotension Neg Hx    Malignant hyperthermia Neg Hx    Pseudochol deficiency Neg Hx    Colon cancer Neg Hx    Inflammatory bowel disease Neg Hx     Social History  reports that he has never smoked. He has quit using smokeless tobacco.  His smokeless tobacco use included snuff. He reports that he does not drink alcohol and does not use drugs.  Allergies  Allergen Reactions   Bactrim   [Sulfamethoxazole -Trimethoprim ] Rash   Amoxicillin  Hives    Tolerated Cefepime     Medications  No current facility-administered medications for this encounter.  Current Outpatient Medications:    BD VEO INSULIN  SYRINGE U/F 31G X 15/64 1 ML MISC,  USE AS DIRECTED, Disp: 100 each, Rfl: 2   CRANBERRY PO, Take 5 mLs by mouth daily., Disp: , Rfl:    glucose blood (ONETOUCH VERIO) test strip, TEST twice a day, Disp: 100 each, Rfl: 3   Insulin  Pen Needle (NOVOTWIST) 32G X 5 MM MISC, Use two daily to inject Victoza  and Toujeo ., Disp: 90 each, Rfl: 5   levocetirizine (XYZAL) 5 MG tablet, Take 5 mg by mouth daily as needed for allergies., Disp: , Rfl:    loperamide (IMODIUM A-D) 2 MG tablet, Take 2 mg by mouth 4 (four) times daily as needed for diarrhea or loose stools., Disp: , Rfl:    metoprolol  succinate (TOPROL -XL) 25 MG 24 hr tablet, Take 0.5 tablets (12.5 mg total) by mouth daily., Disp: 30 tablet, Rfl: 1   ONETOUCH DELICA LANCETS 33G MISC, Use to  check blood sugar once a day dx code E11.65, Disp: 50 each, Rfl: 3   oxybutynin  (DITROPAN ) 5 MG tablet, Take 1 tablet (5 mg total) by mouth every 8 (eight) hours as needed for bladder spasms., Disp: 30 tablet, Rfl: 3   pantoprazole  (PROTONIX ) 40 MG tablet, Take 1 tablet (40 mg total) by mouth daily., Disp: 30 tablet, Rfl: 0   potassium chloride  (KLOR-CON ) 10 MEQ tablet, Take 10 mEq by mouth daily as needed (low potassium levels)., Disp: , Rfl:    Probiotic Product (PROBIOTIC PO), Take 1 tablet by mouth daily., Disp: , Rfl:    rosuvastatin  (CRESTOR ) 40 MG tablet, Take 1 tablet (40 mg total) by mouth daily., Disp: 30 tablet, Rfl: 1   sertraline (ZOLOFT) 50 MG tablet, Take 50 mg by mouth daily., Disp: , Rfl:    traZODone  (DESYREL ) 150 MG tablet, Take 150 mg by mouth at bedtime., Disp: , Rfl:    TRESIBA  FLEXTOUCH 200 UNIT/ML FlexTouch Pen, Inject under skin 15 units twice a day. (Patient taking differently: Inject 18 Units into the skin daily as needed  (blood glucose over 130).), Disp: 3 mL, Rfl: 0   TYLENOL  500 MG tablet, Take 1 tablet (500 mg total) by mouth every 6 (six) hours as needed for mild pain or headache., Disp: 30 tablet, Rfl: 0  Vitals   Vitals:   10/08/23 1051 10/08/23 1243  BP: 132/68   Pulse: 66   Resp: 18   SpO2: 99% 96%    There is no height or weight on file to calculate BMI.   Physical Exam   Constitutional: Appears well-developed and well-nourished.  Psych: Affect appropriate to situation.  Eyes: No scleral injection.  HENT: No OP obstruction.  Head: Normocephalic.  Cardiovascular: some PVCs on telemetry Respiratory: Effort normal, non-labored breathing.  GI: Soft.  No distension. There is no tenderness.  Skin: WDI.   Neurologic Examination  Mental Status: Patient is awake, alert, and oriented to person and place only. Abulic. At patient's baseline, he struggles with following two-step commands and has a cognitive deficit at baseline. Wife endorses that current responses to questions and commands is consistent with his baseline. No dysarthria or aphasia present. Cranial Nerves: II: PERRL III, IV, VI: Unable to assess, patient unable to follow two step command to look at finger and then track it. VII: Facial movements intact bilaterally. VIII: hearing intact to voice  IX, X: palate raises symmetrically XI: head is midline XII: Tongue protrudes midline Motor Strength: BUE: able to elevate against gravity bilaterally. No drift noted. BLE: able to elevate against gravity bilaterally. No drift noted. Sensory: Intact bilaterally Coordination: Able to flex hip and knees with no ataxia, moving arms above head without overt ataxia. Gait: deferred  Labs/Imaging/Neurodiagnostic studies   CBC:  Recent Labs  Lab 19-Oct-2023 0852 10/08/23 1150  WBC  --  7.3  NEUTROABS  --  5.7  HGB 13.6 15.3  HCT 40.0 45.0  MCV  --  83.5  PLT  --  175   Basic Metabolic Panel:  Lab Results  Component Value Date   NA  139 10/08/2023   K 3.3 (L) 10/08/2023   CO2 26 10/08/2023   GLUCOSE 170 (H) 10/08/2023   BUN 6 (L) 10/08/2023   CREATININE 0.81 10/08/2023   CALCIUM  8.9 10/08/2023   GFRNONAA >60 10/08/2023   GFRAA >90 04/07/2011   Lipid Panel:  Lab Results  Component Value Date   LDLCALC 66 04/29/2021   HgbA1c:  Lab Results  Component Value Date   HGBA1C 6.6 (H) 03/29/2022   Urine Drug Screen:     Component Value Date/Time   LABOPIA NONE DETECTED 04/28/2021 1735   COCAINSCRNUR NONE DETECTED 04/28/2021 1735   LABBENZ NONE DETECTED 04/28/2021 1735   AMPHETMU NONE DETECTED 04/28/2021 1735   THCU NONE DETECTED 04/28/2021 1735   LABBARB NONE DETECTED 04/28/2021 1735    Alcohol Level     Component Value Date/Time   ETH <10 04/28/2021 1504   INR  Lab Results  Component Value Date   INR 1.0 10/08/2023   APTT  Lab Results  Component Value Date   APTT 29 10/08/2023    ASSESSMENT  Troy Randall is a 63 y.o. male with pertinent past history of prior CVA (04/2021), diabetes, hyperlipidemia, hypertension, UTI, and GI bleed who who presents to ED after results of routine MRI showed four small acute infarcts. He is followed by Dr. Vear at [facility]. Patient is present with his spouse who states patient seems to be at baseline and was not having any symptoms. Patient denies any headache, changes in vision, weakness or numbness/tingling sensation. He is not on any anticoagulation therapies due to history of a GI bleed. Patient denies any GI bleeding, or bleeding otherwise at this time. - Exam reveals no new focal deficits. He is abulic with poor orientation at baseline.  - MRI brain w/wo contrast: Four small acute infarcts within the right corona radiata, right caudate nucleus and left periatrial white matter (measuring up to 7 mm). Numerous chronic insults within the bilateral cerebral hemispheric white matter and to a lesser degree within/about the bilateral basal ganglia and within the pons. Background  advanced cerebral white matter disease. These findings are similar to the prior MRI of 04/01/2022. Chronic insults within the bilateral thalami, new from the prior MRI. Small chronic infarct within the left cerebellar hemisphere, unchanged. Mild-to-moderate generalized cerebral atrophy. Mild cerebellar atrophy. - Patient not currently on antiplatelet therapy due to history of GI bleed, patient's wife would like to discuss risks and benefits of beginning antiplatelet therapy with stroke team in the morning. I did discuss with Dr. Rosemarie from the stroke team and he recommends ASA 81mg  alone given asymptomatic stroke.   - Recommend stroke workup inpatient.  RECOMMENDATIONS  - HgbA1c, fasting lipid panel - Vessel imaging with CTA Head and Neck  - Frequent neuro checks - Echocardiogram - Prophylactic therapy-Antiplatelet med: Aspirin  81mg  - Risk factor modification - Telemetry monitoring - PT consult, OT consult, Speech consult - Stroke team to follow  ______________________________________________________________________  Electronically signed: Dr. Hodan Wurtz

## 2023-10-08 NOTE — ED Triage Notes (Signed)
 Pt to ER states had MRI yesterday and was called with results and told he had four new spots that show stroke and he should come to ER.

## 2023-10-09 ENCOUNTER — Other Ambulatory Visit (HOSPITAL_COMMUNITY): Payer: Self-pay

## 2023-10-09 ENCOUNTER — Inpatient Hospital Stay (HOSPITAL_COMMUNITY)

## 2023-10-09 DIAGNOSIS — I6381 Other cerebral infarction due to occlusion or stenosis of small artery: Secondary | ICD-10-CM

## 2023-10-09 DIAGNOSIS — I69391 Dysphagia following cerebral infarction: Secondary | ICD-10-CM

## 2023-10-09 DIAGNOSIS — E1151 Type 2 diabetes mellitus with diabetic peripheral angiopathy without gangrene: Secondary | ICD-10-CM | POA: Diagnosis not present

## 2023-10-09 DIAGNOSIS — R29818 Other symptoms and signs involving the nervous system: Secondary | ICD-10-CM | POA: Diagnosis not present

## 2023-10-09 DIAGNOSIS — R9082 White matter disease, unspecified: Secondary | ICD-10-CM | POA: Diagnosis not present

## 2023-10-09 DIAGNOSIS — I6523 Occlusion and stenosis of bilateral carotid arteries: Secondary | ICD-10-CM | POA: Diagnosis not present

## 2023-10-09 LAB — LIPID PANEL
Cholesterol: 101 mg/dL (ref 0–200)
HDL: 26 mg/dL — ABNORMAL LOW (ref >40)
LDL Cholesterol: 56 mg/dL (ref 0–99)
Total CHOL/HDL Ratio: 3.9 ratio
Triglycerides: 95 mg/dL (ref <150)
VLDL: 19 mg/dL (ref 0–40)

## 2023-10-09 LAB — HIV ANTIBODY (ROUTINE TESTING W REFLEX): HIV Screen 4th Generation wRfx: NONREACTIVE

## 2023-10-09 LAB — GLUCOSE, CAPILLARY
Glucose-Capillary: 161 mg/dL — ABNORMAL HIGH (ref 70–99)
Glucose-Capillary: 138 mg/dL — ABNORMAL HIGH (ref 70–99)

## 2023-10-09 LAB — BASIC METABOLIC PANEL WITH GFR
Anion gap: 8 (ref 5–15)
BUN: 12 mg/dL (ref 8–23)
CO2: 23 mmol/L (ref 22–32)
Calcium: 8.3 mg/dL — ABNORMAL LOW (ref 8.9–10.3)
Chloride: 106 mmol/L (ref 98–111)
Creatinine, Ser: 1 mg/dL (ref 0.61–1.24)
GFR, Estimated: >60 mL/min (ref >60)
Glucose, Bld: 157 mg/dL — ABNORMAL HIGH (ref 70–99)
Potassium: 4.1 mmol/L (ref 3.5–5.1)
Sodium: 137 mmol/L (ref 135–145)

## 2023-10-09 LAB — HEMOGLOBIN A1C
Hgb A1c MFr Bld: 5.3 % (ref 4.8–5.6)
Mean Plasma Glucose: 105.41 mg/dL

## 2023-10-09 MED ORDER — CLOPIDOGREL BISULFATE 75 MG PO TABS
75.0000 mg | ORAL_TABLET | Freq: Every day | ORAL | Status: DC
  Administered 2023-10-09: 75 mg via ORAL
  Filled 2023-10-09: qty 1

## 2023-10-09 MED ORDER — ASPIRIN 81 MG PO CHEW
81.0000 mg | CHEWABLE_TABLET | Freq: Every day | ORAL | 0 refills | Status: AC
  Filled 2023-10-09: qty 30, 30d supply, fill #0

## 2023-10-09 MED ORDER — IOHEXOL 350 MG/ML SOLN
75.0000 mL | Freq: Once | INTRAVENOUS | Status: AC | PRN
  Administered 2023-10-09: 75 mL via INTRAVENOUS

## 2023-10-09 NOTE — Progress Notes (Addendum)
 STROKE TEAM PROGRESS NOTE    INTERIM HISTORY/SUBJECTIVE  Wife is at the bedside.  Patient is laying in the bed in no apparent distress.  Patient had an outpatient MRI for follow-up per Dr. Vear which revealed multiple small acute infarcts in right corona radiata, right caudate nucleus and left periatrial white matter.  No strokelike symptoms noticed per wife.  He was admitted for stroke workup He has prior history of bilateral embolic strokes in the setting of suspected endocarditis in 2023.  He was referred to Dr. Zada for evaluation of multiple sclerosis who felt felt that it was unlikely  CBC    Component Value Date/Time   WBC 7.3 10/08/2023 1150   RBC 5.39 10/08/2023 1150   HGB 15.3 10/08/2023 1150   HCT 45.0 10/08/2023 1150   PLT 175 10/08/2023 1150   MCV 83.5 10/08/2023 1150   MCH 28.4 10/08/2023 1150   MCHC 34.0 10/08/2023 1150   RDW 12.7 10/08/2023 1150   LYMPHSABS 1.1 10/08/2023 1150   MONOABS 0.3 10/08/2023 1150   EOSABS 0.1 10/08/2023 1150   BASOSABS 0.1 10/08/2023 1150    BMET    Component Value Date/Time   NA 137 10/09/2023 0629   K 4.1 10/09/2023 0629   CL 106 10/09/2023 0629   CO2 23 10/09/2023 0629   GLUCOSE 157 (H) 10/09/2023 0629   BUN 12 10/09/2023 0629   CREATININE 1.00 10/09/2023 0629   CREATININE 0.90 03/31/2017 1625   CALCIUM  8.3 (L) 10/09/2023 0629   GFRNONAA >60 10/09/2023 0629    IMAGING past 24 hours CT ANGIO HEAD NECK W WO CM Result Date: 10/09/2023 EXAM: CTA HEAD AND NECK WITH AND WITHOUT 10/09/2023 06:20:16 AM TECHNIQUE: CTA of the head and neck was performed with and without the administration of 75 mL of iohexol  (OMNIPAQUE ) 350 MG/ML injection. Multiplanar 2D and/or 3D reformatted images are provided for review. Automated exposure control, iterative reconstruction, and/or weight based adjustment of the mA/kV was utilized to reduce the radiation dose to as low as reasonably achievable. Stenosis of the internal carotid arteries measured  using NASCET criteria. COMPARISON: CT angiogram of the head and neck dated 09/30/2021 and MRI of the head dated 10/07/2023. CLINICAL HISTORY: Neuro deficit, acute, stroke suspected. ams. FINDINGS: CTA NECK: AORTIC ARCH AND ARCH VESSELS: Mild calcific plaque present within the aortic arch. No dissection or arterial injury. No significant stenosis of the brachiocephalic or subclavian arteries. CERVICAL CAROTID ARTERIES: Moderate calcific and noncalcific plaque present within the origin of the left internal carotid artery, with less than 50% luminal stenosis. Mild calcific plaque within the origin of the right internal carotid artery with less than 10% luminal stenosis. No dissection or arterial injury. CERVICAL VERTEBRAL ARTERIES: The left vertebral artery is dominant. The vertebral arteries are normal in caliber throughout the respective courses. No dissection or arterial injury. LUNGS AND MEDIASTINUM: Unremarkable. SOFT TISSUES: No acute abnormality. BONES: No acute abnormality. CTA HEAD: ANTERIOR CIRCULATION: No significant stenosis of the intracranial internal carotid arteries. No significant stenosis of the anterior cerebral arteries. No significant stenosis of the middle cerebral arteries. No aneurysm. POSTERIOR CIRCULATION: No significant stenosis of the posterior cerebral arteries. No significant stenosis of the basilar artery. No significant stenosis of the intracranial vertebral arteries. No aneurysm. OTHER: There is age-related volume loss. There is moderate periventricular and deep cerebral white matter disease present. No dural venous sinus thrombosis on this non-dedicated study. IMPRESSION: 1. No large vessel occlusion or aneurysm in the head or neck. 2. Moderate atherosclerotic plaque  at the left internal carotid artery origin with less than 50% stenosis. 3. Mild atherosclerotic plaque at the right internal carotid artery origin with less than 10% stenosis. Electronically signed by: Evalene Coho MD  10/09/2023 06:49 AM EDT RP Workstation: [id number]   ECHOCARDIOGRAM COMPLETE Result Date: 10/08/2023    ECHOCARDIOGRAM REPORT   Patient Name:   MYRL LAZARUS Date of Exam: 10/08/2023 Medical Rec #:  [id number]       Height:       72.0 in Accession #:    [id number]      Weight:       185.0 lb Date of Birth:  November 08, 1960       BSA:          2.061 m Patient Age:    63 years        BP:           161/99 mmHg Patient Gender: M               HR:           53 bpm. Exam Location:  Inpatient Procedure: 2D Echo and Saline Contrast Bubble Study (Both Spectral and Color            Flow Doppler were utilized during procedure). Indications:    Stroke  History:        Patient has prior history of Echocardiogram examinations.                 Stroke; Risk Factors:Diabetes and Hypertension.  Sonographer:    Norleen Amour Referring Phys: [id number] DEVON SHAFER IMPRESSIONS  1. Left ventricular ejection fraction, by estimation, is 60 to 65%. The left ventricle has normal function. The left ventricle has no regional wall motion abnormalities. Left ventricular diastolic parameters are indeterminate.  2. Right ventricular systolic function is normal. The right ventricular size is normal.  3. The mitral valve is normal in structure. Trivial mitral valve regurgitation.  4. The aortic valve is tricuspid. Aortic valve regurgitation is not visualized. Aortic valve sclerosis/calcification is present, without any evidence of aortic stenosis.  5. Aortic dilatation noted. There is mild dilatation of the ascending aorta, measuring 41 mm.  6. Agitated saline contrast bubble study was negative, with no evidence of any interatrial shunt. FINDINGS  Left Ventricle: Left ventricular ejection fraction, by estimation, is 60 to 65%. The left ventricle has normal function. The left ventricle has no regional wall motion abnormalities. The left ventricular internal cavity size was normal in size. There is  no left ventricular hypertrophy. Left ventricular  diastolic parameters are indeterminate. Right Ventricle: The right ventricular size is normal. Right vetricular wall thickness was not assessed. Right ventricular systolic function is normal. Left Atrium: Left atrial size was normal in size. Right Atrium: Right atrial size was normal in size. Pericardium: There is no evidence of pericardial effusion. Mitral Valve: The mitral valve is normal in structure. Trivial mitral valve regurgitation. Tricuspid Valve: The tricuspid valve is normal in structure. Tricuspid valve regurgitation is trivial. Aortic Valve: The aortic valve is tricuspid. Aortic valve regurgitation is not visualized. Aortic valve sclerosis/calcification is present, without any evidence of aortic stenosis. Pulmonic Valve: The pulmonic valve was grossly normal. Pulmonic valve regurgitation is not visualized. Aorta: The aortic root is normal in size and structure and aortic dilatation noted. There is mild dilatation of the ascending aorta, measuring 41 mm. IAS/Shunts: No atrial level shunt detected by color flow Doppler. Agitated saline contrast was given intravenously to evaluate for  intracardiac shunting. Agitated saline contrast bubble study was negative, with no evidence of any interatrial shunt.  LEFT VENTRICLE PLAX 2D LVIDd:         5.10 cm     Diastology LVIDs:         3.20 cm     LV e' medial:    6.20 cm/s LV PW:         0.90 cm     LV E/e' medial:  7.6 LV IVS:        1.10 cm     LV e' lateral:   7.18 cm/s LVOT diam:     2.00 cm     LV E/e' lateral: 6.6 LV SV:         54 LV SV Index:   26 LVOT Area:     3.14 cm  LV Volumes (MOD) LV vol d, MOD A2C: 65.8 ml LV vol d, MOD A4C: 89.5 ml LV vol s, MOD A2C: 23.9 ml LV vol s, MOD A4C: 25.5 ml LV SV MOD A2C:     41.9 ml LV SV MOD A4C:     89.5 ml LV SV MOD BP:      51.6 ml RIGHT VENTRICLE RV Basal diam:  2.70 cm RV S prime:     10.20 cm/s TAPSE (M-mode): 2.5 cm LEFT ATRIUM             Index        RIGHT ATRIUM          Index LA diam:        3.60 cm 1.75  cm/m   RA Area:     7.61 cm LA Vol (A2C):   31.5 ml 15.28 ml/m  RA Volume:   11.50 ml 5.58 ml/m LA Vol (A4C):   27.9 ml 13.54 ml/m LA Biplane Vol: 32.5 ml 15.77 ml/m  AORTIC VALVE             PULMONIC VALVE LVOT Vmax:   66.30 cm/s  PV Vmax:       0.71 m/s LVOT Vmean:  41.000 cm/s PV Peak grad:  2.0 mmHg LVOT VTI:    0.171 m  AORTA Ao Root diam: 3.70 cm Ao Asc diam:  4.10 cm MITRAL VALVE MV Area (PHT): 2.51 cm    SHUNTS MV Decel Time: 302 msec    Systemic VTI:  0.17 m MV E velocity: 47.20 cm/s  Systemic Diam: 2.00 cm MV A velocity: 60.30 cm/s MV E/A ratio:  0.78 Vina Gull MD Electronically signed by Vina Gull MD Signature Date/Time: 10/08/2023/6:00:57 PM    Final     Vitals:   10/08/23 2348 10/09/23 0432 10/09/23 0834 10/09/23 1209  BP: 94/66 114/65 125/75 (!) 173/84  Pulse: 60 (!) 51 (!) 51 61  Resp: 17 18  18   Temp: 98.8 F (37.1 C) 97.7 F (36.5 C) 98 F (36.7 C) (!) 97.5 F (36.4 C)  TempSrc: Oral Oral Oral Oral  SpO2: 97% 98% 96% 98%  Weight:      Height:         PHYSICAL EXAM General: Chronically ill male Psych:  Mood and affect appropriate for situation CV: Regular rate and rhythm on monitor Respiratory:  Regular, unlabored respirations on room air GI: Abdomen soft and nontender   NEURO:  Mental Status: Awake alert and oriented, able to answer orientation questions.  Can follow simple commands Speech/Language: speech is without dysarthria or aphasia.  Naming, repetition, fluency, and comprehension intact.  Cranial Nerves:  II: PERRL. Visual fields full.  III, IV, VI: EOM with upward gaze restriction eyelids elevate symmetrically.  V: Sensation is intact to light touch and symmetrical to face.  VII: Face is symmetrical resting and smiling VIII: hearing intact to voice. IX, X: Palate elevates symmetrically. Phonation is normal.  KP:Dynloizm shrug 5/5. XII: tongue is midline without fasciculations. Motor: 5/5 strength in bilateral uppers, bilateral lowers 4  -/5 Tone: is normal and bulk is normal Sensation- Intact to light touch bilaterally. Extinction absent to light touch to DSS.   Coordination: FTN intact bilaterally, Gait- deferred   ASSESSMENT/PLAN  [name] is a 63 y.o. male with history of  multiple prior CVAs, most recent (04/2021), anemia, arthritis, diabetes, hyperlipidemia, hypertension, kidney stones, cholecystitis, UTI, suprapubic foley, GI bleed,  who presents to ED after results of routine MRI showed  Four small acute infarcts within the right corona radiata, right caudate nucleus and left periatrial white matter  Acute Ischemic Infarct: Right corona radiata and caudate nucleus and left periatrial white matter Etiology: Small vessel disease CTA head & neck no LVO MRI  small acute infarcts in right corona radiata, right caudate nucleus and left periatrial white matter. 2D Echo EF 60 to 65%. LDL 56 HgbA1c 5.3 VTE prophylaxis -SCDs No antithrombotic prior to admission, now on aspirin  81 mg daily due to history of ulcer bleeding and bleeding due to diverticulitis Therapy recommendations:  Pending Disposition: Pending  Hx of Stroke/TIA April 2023 left frontal lobe stroke, with additional lacunar infarcts  Hypertension Home meds: Metoprolol  12.5 mg, UnStable Blood Pressure Goal: SBP less than 160   Hyperlipidemia Home meds: Crestor  40 mg, resumed in hospital LDL 56, goal < 70 Continue statin at discharge  Diabetes type II Controlled Home meds: Tresiba , insulin  HgbA1c 5.3, goal < 7.0 CBGs SSI Recommend close follow-up with PCP for better DM control  Dysphagia Patient has post-stroke dysphagia, SLP consulted    Diet   Diet regular Room service appropriate? Yes; Fluid consistency: Thin   Advance diet as tolerated   Hospital day # 1   Karna Geralds DNP, ACNPC-AG  [facility]  I have personally obtained history,examined this patient, reviewed notes, independently viewed imaging studies,  participated in medical decision making and plan of care.ROS completed by me personally and pertinent positives fully documented  I have made any additions or clarifications directly to the above note. Agree with note above.  Patient presented without focal strokelike symptoms but had elective outpatient MRI ordered by Dr. Saitta as follow-up for previous abnormal MRIs raising concern for embolic stroke versus MS.  MRI at this time shows bilateral small punctate subcortical lacunar infarcts likely from small vessel disease.  Continue aspirin  alone for stroke prevention given prior history of peptic ulcer and GI bleeding and maintain aggressive risk factor modification.  Follow-up as an outpatient Dr. Vear in neurology clinic.  Long discussion patient and wife and answered questions.   I personally spent a total of 50 minutes in the care of the patient today including getting/reviewing separately obtained history, performing a medically appropriate exam/evaluation, counseling and educating, placing orders, referring and communicating with other health care professionals, documenting clinical information in the EHR, independently interpreting results, and coordinating care.        Eather Popp, MD Medical Director [facility] Pager: [phone] 10/09/2023 3:26 PM   To contact Stroke Continuity provider, please refer to [email or web address]. After hours, contact General Neurology

## 2023-10-09 NOTE — Discharge Summary (Signed)
 Name: Troy Randall MRN: [id number] DOB: [birth year] 63 y.o. PCP: [name], MD  Date of Admission: 10/08/2023 11:01 AM Date of Discharge:  10/09/2023 Attending Physician: Dr. Shawn  DISCHARGE DIAGNOSIS:  Primary Problem: Stroke [facility])   Hospital Problems: Principal Problem:   Stroke [facility] Active Problems:   DM2 (diabetes mellitus, type 2) (HCC)   Hyperlipidemia    DISCHARGE MEDICATIONS:   Allergies as of 10/09/2023       Reactions   Bactrim  [sulfamethoxazole -trimethoprim ] Rash   Amoxicillin  Hives   Tolerated Cefepime         Medication List     TAKE these medications    aspirin  81 MG chewable tablet Chew 1 tablet (81 mg total) by mouth daily. Start taking on: October 10, 2023   BD Veo Insulin  Syringe U/F 31G X 15/64 1 ML Misc Generic drug: Insulin  Syringe-Needle U-100 USE AS DIRECTED   CRANBERRY PO Take 5 mLs by mouth daily.   glucose blood test strip Commonly known as: OneTouch Verio TEST twice a day   Insulin  Pen Needle 32G X 5 MM Misc Commonly known as: NovoTwist Use two daily to inject Victoza  and Toujeo .   levocetirizine 5 MG tablet Commonly known as: XYZAL Take 5 mg by mouth daily as needed for allergies.   loperamide 2 MG tablet Commonly known as: IMODIUM A-D Take 2 mg by mouth 4 (four) times daily as needed for diarrhea or loose stools.   metoprolol  succinate 25 MG 24 hr tablet Commonly known as: TOPROL -XL Take 0.5 tablets (12.5 mg total) by mouth daily.   OneTouch Delica Lancets 33G Misc Use to check blood sugar once a day dx code E11.65   oxybutynin  5 MG tablet Commonly known as: DITROPAN  Take 1 tablet (5 mg total) by mouth every 8 (eight) hours as needed for bladder spasms.   pantoprazole  40 MG tablet Commonly known as: PROTONIX  Take 1 tablet (40 mg total) by mouth daily.   potassium chloride  10 MEQ tablet Commonly known as: KLOR-CON  Take 10 mEq by mouth daily as needed (low potassium levels).   PROBIOTIC PO Take 1 tablet  by mouth daily.   rosuvastatin  40 MG tablet Commonly known as: Crestor  Take 1 tablet (40 mg total) by mouth daily.   sertraline 50 MG tablet Commonly known as: ZOLOFT Take 50 mg by mouth daily.   traZODone  150 MG tablet Commonly known as: DESYREL  Take 150 mg by mouth at bedtime.   Tresiba  FlexTouch 200 UNIT/ML FlexTouch Pen Generic drug: insulin  degludec Inject under skin 15 units twice a day. What changed:  how much to take how to take this when to take this reasons to take this additional instructions   TYLENOL  500 MG tablet Generic drug: acetaminophen  Take 1 tablet (500 mg total) by mouth every 6 (six) hours as needed for mild pain or headache.        DISPOSITION AND FOLLOW-UP:  Troy Randall was discharged from [facility] in Stable condition. At the hospital follow up visit please address:  Follow-up Recommendations:  Acute ischemic stroke, right corona radiata, right, caudate nucleus and left periatrial white matter  Ensure he remains at baseline with no new deficits. Ensure compliance with ASA and inquire about signs of GI bleeding.   HOSPITAL COURSE:  Patient Summary:  Acute ischemic stroke, right corona radiata, right, caudate nucleus and left periatrial white matter  Incidental finding on routine follow-up MRI on 10/2 which was ordered based on previous stroke history to further  evaluate known chronic insults which have been well-documented in the past.  He was told to come to ED based on these new findings.  He was at baseline with no new neurological/focal deficits.  CTA head and neck unremarkable aside from atherosclerotic plaque with < 50% and < 10% stenosis in the left and right ICA respectively.  Echocardiogram performed which was unremarkable.  Fasting lipid panel showed LDL of 56 and A1c was 5.3. Neurology consulted and recommended ASA monotherapy given history of recurrent GI bleed. He was previously on Crestor  40 mg which was  continued. Overnight he remained stable with no new deficits.  Telemetry unremarkable.  He has home health previously established and will follow-up with PCP.  DISCHARGE INSTRUCTIONS:   Discharge Instructions     Call MD for:  difficulty breathing, headache or visual disturbances   Complete by: As directed    Call MD for:  extreme fatigue   Complete by: As directed    Call MD for:  persistant dizziness or light-headedness   Complete by: As directed    Diet - low sodium heart healthy   Complete by: As directed    Discharge instructions   Complete by: As directed    You were admitted to the hospital because a recent MRI showed that you had a new stroke.  Thankfully, the stroke was not large enough to cause any symptoms.  The neurology team is recommending starting aspirin  81 mg daily.  Please follow-up with your PCP and call 911 if you begin to experience new or worsening weakness, confusion, and/or speech difficulty.   Increase activity slowly   Complete by: As directed        SUBJECTIVE:  Feels well. No new weakness, confusion, HA. Discharge Vitals:   BP 125/75 (BP Location: Right Arm)   Pulse (!) 51   Temp 98 F (36.7 C) (Oral)   Resp 18   Ht 6' (1.829 m)   Wt 83.9 kg   SpO2 96%   BMI 25.09 kg/m   OBJECTIVE:   Physical Exam Constitutional: awake, resting in bed, in no acute distress Cardiovascular: bradycardia regular rhythm, no m/r/g Pulmonary/Chest: normal work of breathing on room air, lungs clear to auscultation bilaterally Neurological: alert & oriented x 3, patient able to squeeze hands bilaterally, lift legs up against pressure, sensation intact in upper and lower extremities. CN II-VII intact   Skin: warm and dry Psych: Mood normal  Pertinent Labs, Studies, and Procedures:     Latest Ref Rng & Units 10/08/2023   11:50 AM 10/07/2023    8:52 AM 07/23/2023    8:21 AM  CBC  WBC 4.0 - 10.5 K/uL 7.3   6.1   Hemoglobin 13.0 - 17.0 g/dL 84.6  86.3  86.1    Hematocrit 39.0 - 52.0 % 45.0  40.0  40.5   Platelets 150 - 400 K/uL 175   152        Latest Ref Rng & Units 10/09/2023    6:29 AM 10/08/2023   11:50 AM 10/07/2023    8:52 AM  CMP  Glucose 70 - 99 mg/dL 842  829  860   BUN 8 - 23 mg/dL 12  6  13    Creatinine 0.61 - 1.24 mg/dL 8.99  9.18  9.19   Sodium 135 - 145 mmol/L 137  139  140   Potassium 3.5 - 5.1 mmol/L 4.1  3.3  3.5   Chloride 98 - 111 mmol/L 106  103  107  CO2 22 - 32 mmol/L 23  26    Calcium  8.9 - 10.3 mg/dL 8.3  8.9    Total Protein 6.5 - 8.1 g/dL  7.5    Total Bilirubin 0.0 - 1.2 mg/dL  1.3    Alkaline Phos 38 - 126 U/L  87    AST 15 - 41 U/L  32    ALT 0 - 44 U/L  29      CT ANGIO HEAD NECK W WO CM Result Date: 10/09/2023 EXAM: CTA HEAD AND NECK WITH AND WITHOUT 10/09/2023 06:20:16 AM TECHNIQUE: CTA of the head and neck was performed with and without the administration of 75 mL of iohexol  (OMNIPAQUE ) 350 MG/ML injection. Multiplanar 2D and/or 3D reformatted images are provided for review. Automated exposure control, iterative reconstruction, and/or weight based adjustment of the mA/kV was utilized to reduce the radiation dose to as low as reasonably achievable. Stenosis of the internal carotid arteries measured using NASCET criteria. COMPARISON: CT angiogram of the head and neck dated 09/30/2021 and MRI of the head dated 10/07/2023. CLINICAL HISTORY: Neuro deficit, acute, stroke suspected. ams. FINDINGS: CTA NECK: AORTIC ARCH AND ARCH VESSELS: Mild calcific plaque present within the aortic arch. No dissection or arterial injury. No significant stenosis of the brachiocephalic or subclavian arteries. CERVICAL CAROTID ARTERIES: Moderate calcific and noncalcific plaque present within the origin of the left internal carotid artery, with less than 50% luminal stenosis. Mild calcific plaque within the origin of the right internal carotid artery with less than 10% luminal stenosis. No dissection or arterial injury. CERVICAL VERTEBRAL  ARTERIES: The left vertebral artery is dominant. The vertebral arteries are normal in caliber throughout the respective courses. No dissection or arterial injury. LUNGS AND MEDIASTINUM: Unremarkable. SOFT TISSUES: No acute abnormality. BONES: No acute abnormality. CTA HEAD: ANTERIOR CIRCULATION: No significant stenosis of the intracranial internal carotid arteries. No significant stenosis of the anterior cerebral arteries. No significant stenosis of the middle cerebral arteries. No aneurysm. POSTERIOR CIRCULATION: No significant stenosis of the posterior cerebral arteries. No significant stenosis of the basilar artery. No significant stenosis of the intracranial vertebral arteries. No aneurysm. OTHER: There is age-related volume loss. There is moderate periventricular and deep cerebral white matter disease present. No dural venous sinus thrombosis on this non-dedicated study. IMPRESSION: 1. No large vessel occlusion or aneurysm in the head or neck. 2. Moderate atherosclerotic plaque at the left internal carotid artery origin with less than 50% stenosis. 3. Mild atherosclerotic plaque at the right internal carotid artery origin with less than 10% stenosis. Electronically signed by: Evalene Coho MD 10/09/2023 06:49 AM EDT RP Workstation: [id number]   ECHOCARDIOGRAM COMPLETE Result Date: 10/08/2023    ECHOCARDIOGRAM REPORT   Patient Name:   YOON BARCA Date of Exam: 10/08/2023 Medical Rec #:  [id number]       Height:       72.0 in Accession #:    [id number]      Weight:       185.0 lb Date of Birth:  03-24-1960       BSA:          2.061 m Patient Age:    63 years        BP:           161/99 mmHg Patient Gender: M               HR:           53 bpm. Exam Location:  Inpatient Procedure: 2D  Echo and Saline Contrast Bubble Study (Both Spectral and Color            Flow Doppler were utilized during procedure). Indications:    Stroke  History:        Patient has prior history of Echocardiogram examinations.                  Stroke; Risk Factors:Diabetes and Hypertension.  Sonographer:    Norleen Amour Referring Phys: [id number] DEVON SHAFER IMPRESSIONS  1. Left ventricular ejection fraction, by estimation, is 60 to 65%. The left ventricle has normal function. The left ventricle has no regional wall motion abnormalities. Left ventricular diastolic parameters are indeterminate.  2. Right ventricular systolic function is normal. The right ventricular size is normal.  3. The mitral valve is normal in structure. Trivial mitral valve regurgitation.  4. The aortic valve is tricuspid. Aortic valve regurgitation is not visualized. Aortic valve sclerosis/calcification is present, without any evidence of aortic stenosis.  5. Aortic dilatation noted. There is mild dilatation of the ascending aorta, measuring 41 mm.  6. Agitated saline contrast bubble study was negative, with no evidence of any interatrial shunt. FINDINGS  Left Ventricle: Left ventricular ejection fraction, by estimation, is 60 to 65%. The left ventricle has normal function. The left ventricle has no regional wall motion abnormalities. The left ventricular internal cavity size was normal in size. There is  no left ventricular hypertrophy. Left ventricular diastolic parameters are indeterminate. Right Ventricle: The right ventricular size is normal. Right vetricular wall thickness was not assessed. Right ventricular systolic function is normal. Left Atrium: Left atrial size was normal in size. Right Atrium: Right atrial size was normal in size. Pericardium: There is no evidence of pericardial effusion. Mitral Valve: The mitral valve is normal in structure. Trivial mitral valve regurgitation. Tricuspid Valve: The tricuspid valve is normal in structure. Tricuspid valve regurgitation is trivial. Aortic Valve: The aortic valve is tricuspid. Aortic valve regurgitation is not visualized. Aortic valve sclerosis/calcification is present, without any evidence of aortic stenosis. Pulmonic  Valve: The pulmonic valve was grossly normal. Pulmonic valve regurgitation is not visualized. Aorta: The aortic root is normal in size and structure and aortic dilatation noted. There is mild dilatation of the ascending aorta, measuring 41 mm. IAS/Shunts: No atrial level shunt detected by color flow Doppler. Agitated saline contrast was given intravenously to evaluate for intracardiac shunting. Agitated saline contrast bubble study was negative, with no evidence of any interatrial shunt.  LEFT VENTRICLE PLAX 2D LVIDd:         5.10 cm     Diastology LVIDs:         3.20 cm     LV e' medial:    6.20 cm/s LV PW:         0.90 cm     LV E/e' medial:  7.6 LV IVS:        1.10 cm     LV e' lateral:   7.18 cm/s LVOT diam:     2.00 cm     LV E/e' lateral: 6.6 LV SV:         54 LV SV Index:   26 LVOT Area:     3.14 cm  LV Volumes (MOD) LV vol d, MOD A2C: 65.8 ml LV vol d, MOD A4C: 89.5 ml LV vol s, MOD A2C: 23.9 ml LV vol s, MOD A4C: 25.5 ml LV SV MOD A2C:     41.9 ml LV SV MOD A4C:  89.5 ml LV SV MOD BP:      51.6 ml RIGHT VENTRICLE RV Basal diam:  2.70 cm RV S prime:     10.20 cm/s TAPSE (M-mode): 2.5 cm LEFT ATRIUM             Index        RIGHT ATRIUM          Index LA diam:        3.60 cm 1.75 cm/m   RA Area:     7.61 cm LA Vol (A2C):   31.5 ml 15.28 ml/m  RA Volume:   11.50 ml 5.58 ml/m LA Vol (A4C):   27.9 ml 13.54 ml/m LA Biplane Vol: 32.5 ml 15.77 ml/m  AORTIC VALVE             PULMONIC VALVE LVOT Vmax:   66.30 cm/s  PV Vmax:       0.71 m/s LVOT Vmean:  41.000 cm/s PV Peak grad:  2.0 mmHg LVOT VTI:    0.171 m  AORTA Ao Root diam: 3.70 cm Ao Asc diam:  4.10 cm MITRAL VALVE MV Area (PHT): 2.51 cm    SHUNTS MV Decel Time: 302 msec    Systemic VTI:  0.17 m MV E velocity: 47.20 cm/s  Systemic Diam: 2.00 cm MV A velocity: 60.30 cm/s MV E/A ratio:  0.78 Vina Gull MD Electronically signed by Vina Gull MD Signature Date/Time: 10/08/2023/6:00:57 PM    Final      Signed: Norman Lobstein, DO  Internal Medicine  Resident, PGY-2 [facility]  Pager: [phone] 11:48 AM, 10/09/2023

## 2023-10-09 NOTE — Plan of Care (Signed)
 In bed. Wife at bedside. Denies pain.

## 2023-10-09 NOTE — Progress Notes (Cosign Needed)
 STROKE TEAM PROGRESS NOTE    INTERIM HISTORY/SUBJECTIVE  Wife is at the bedside.  Patient is laying in the bed in no apparent distress.  Patient had an outpatient MRI for follow-up per Dr. Vear which revealed multiple small acute infarcts in right corona radiata, right caudate nucleus and left periatrial white matter.  No strokelike symptoms noticed per wife.  He was admitted for stroke workup   CBC    Component Value Date/Time   WBC 7.3 10/08/2023 1150   RBC 5.39 10/08/2023 1150   HGB 15.3 10/08/2023 1150   HCT 45.0 10/08/2023 1150   PLT 175 10/08/2023 1150   MCV 83.5 10/08/2023 1150   MCH 28.4 10/08/2023 1150   MCHC 34.0 10/08/2023 1150   RDW 12.7 10/08/2023 1150   LYMPHSABS 1.1 10/08/2023 1150   MONOABS 0.3 10/08/2023 1150   EOSABS 0.1 10/08/2023 1150   BASOSABS 0.1 10/08/2023 1150    BMET    Component Value Date/Time   NA 137 10/09/2023 0629   K 4.1 10/09/2023 0629   CL 106 10/09/2023 0629   CO2 23 10/09/2023 0629   GLUCOSE 157 (H) 10/09/2023 0629   BUN 12 10/09/2023 0629   CREATININE 1.00 10/09/2023 0629   CREATININE 0.90 03/31/2017 1625   CALCIUM  8.3 (L) 10/09/2023 0629   GFRNONAA >60 10/09/2023 0629    IMAGING past 24 hours CT ANGIO HEAD NECK W WO CM Result Date: 10/09/2023 EXAM: CTA HEAD AND NECK WITH AND WITHOUT 10/09/2023 06:20:16 AM TECHNIQUE: CTA of the head and neck was performed with and without the administration of 75 mL of iohexol  (OMNIPAQUE ) 350 MG/ML injection. Multiplanar 2D and/or 3D reformatted images are provided for review. Automated exposure control, iterative reconstruction, and/or weight based adjustment of the mA/kV was utilized to reduce the radiation dose to as low as reasonably achievable. Stenosis of the internal carotid arteries measured using NASCET criteria. COMPARISON: CT angiogram of the head and neck dated 09/30/2021 and MRI of the head dated 10/07/2023. CLINICAL HISTORY: Neuro deficit, acute, stroke suspected. ams. FINDINGS: CTA NECK:  AORTIC ARCH AND ARCH VESSELS: Mild calcific plaque present within the aortic arch. No dissection or arterial injury. No significant stenosis of the brachiocephalic or subclavian arteries. CERVICAL CAROTID ARTERIES: Moderate calcific and noncalcific plaque present within the origin of the left internal carotid artery, with less than 50% luminal stenosis. Mild calcific plaque within the origin of the right internal carotid artery with less than 10% luminal stenosis. No dissection or arterial injury. CERVICAL VERTEBRAL ARTERIES: The left vertebral artery is dominant. The vertebral arteries are normal in caliber throughout the respective courses. No dissection or arterial injury. LUNGS AND MEDIASTINUM: Unremarkable. SOFT TISSUES: No acute abnormality. BONES: No acute abnormality. CTA HEAD: ANTERIOR CIRCULATION: No significant stenosis of the intracranial internal carotid arteries. No significant stenosis of the anterior cerebral arteries. No significant stenosis of the middle cerebral arteries. No aneurysm. POSTERIOR CIRCULATION: No significant stenosis of the posterior cerebral arteries. No significant stenosis of the basilar artery. No significant stenosis of the intracranial vertebral arteries. No aneurysm. OTHER: There is age-related volume loss. There is moderate periventricular and deep cerebral white matter disease present. No dural venous sinus thrombosis on this non-dedicated study. IMPRESSION: 1. No large vessel occlusion or aneurysm in the head or neck. 2. Moderate atherosclerotic plaque at the left internal carotid artery origin with less than 50% stenosis. 3. Mild atherosclerotic plaque at the right internal carotid artery origin with less than 10% stenosis. Electronically signed by: Evalene Coho MD  10/09/2023 06:49 AM EDT RP Workstation: [id number]   ECHOCARDIOGRAM COMPLETE Result Date: 10/08/2023    ECHOCARDIOGRAM REPORT   Patient Name:   Troy Randall Date of Exam: 10/08/2023 Medical Rec #:   [id number]       Height:       72.0 in Accession #:    [id number]      Weight:       185.0 lb Date of Birth:  1960/08/28       BSA:          2.061 m Patient Age:    63 years        BP:           161/99 mmHg Patient Gender: M               HR:           53 bpm. Exam Location:  Inpatient Procedure: 2D Echo and Saline Contrast Bubble Study (Both Spectral and Color            Flow Doppler were utilized during procedure). Indications:    Stroke  History:        Patient has prior history of Echocardiogram examinations.                 Stroke; Risk Factors:Diabetes and Hypertension.  Sonographer:    Norleen Amour Referring Phys: [id number] DEVON SHAFER IMPRESSIONS  1. Left ventricular ejection fraction, by estimation, is 60 to 65%. The left ventricle has normal function. The left ventricle has no regional wall motion abnormalities. Left ventricular diastolic parameters are indeterminate.  2. Right ventricular systolic function is normal. The right ventricular size is normal.  3. The mitral valve is normal in structure. Trivial mitral valve regurgitation.  4. The aortic valve is tricuspid. Aortic valve regurgitation is not visualized. Aortic valve sclerosis/calcification is present, without any evidence of aortic stenosis.  5. Aortic dilatation noted. There is mild dilatation of the ascending aorta, measuring 41 mm.  6. Agitated saline contrast bubble study was negative, with no evidence of any interatrial shunt. FINDINGS  Left Ventricle: Left ventricular ejection fraction, by estimation, is 60 to 65%. The left ventricle has normal function. The left ventricle has no regional wall motion abnormalities. The left ventricular internal cavity size was normal in size. There is  no left ventricular hypertrophy. Left ventricular diastolic parameters are indeterminate. Right Ventricle: The right ventricular size is normal. Right vetricular wall thickness was not assessed. Right ventricular systolic function is normal. Left Atrium: Left  atrial size was normal in size. Right Atrium: Right atrial size was normal in size. Pericardium: There is no evidence of pericardial effusion. Mitral Valve: The mitral valve is normal in structure. Trivial mitral valve regurgitation. Tricuspid Valve: The tricuspid valve is normal in structure. Tricuspid valve regurgitation is trivial. Aortic Valve: The aortic valve is tricuspid. Aortic valve regurgitation is not visualized. Aortic valve sclerosis/calcification is present, without any evidence of aortic stenosis. Pulmonic Valve: The pulmonic valve was grossly normal. Pulmonic valve regurgitation is not visualized. Aorta: The aortic root is normal in size and structure and aortic dilatation noted. There is mild dilatation of the ascending aorta, measuring 41 mm. IAS/Shunts: No atrial level shunt detected by color flow Doppler. Agitated saline contrast was given intravenously to evaluate for intracardiac shunting. Agitated saline contrast bubble study was negative, with no evidence of any interatrial shunt.  LEFT VENTRICLE PLAX 2D LVIDd:         5.10 cm  Diastology LVIDs:         3.20 cm     LV e' medial:    6.20 cm/s LV PW:         0.90 cm     LV E/e' medial:  7.6 LV IVS:        1.10 cm     LV e' lateral:   7.18 cm/s LVOT diam:     2.00 cm     LV E/e' lateral: 6.6 LV SV:         54 LV SV Index:   26 LVOT Area:     3.14 cm  LV Volumes (MOD) LV vol d, MOD A2C: 65.8 ml LV vol d, MOD A4C: 89.5 ml LV vol s, MOD A2C: 23.9 ml LV vol s, MOD A4C: 25.5 ml LV SV MOD A2C:     41.9 ml LV SV MOD A4C:     89.5 ml LV SV MOD BP:      51.6 ml RIGHT VENTRICLE RV Basal diam:  2.70 cm RV S prime:     10.20 cm/s TAPSE (M-mode): 2.5 cm LEFT ATRIUM             Index        RIGHT ATRIUM          Index LA diam:        3.60 cm 1.75 cm/m   RA Area:     7.61 cm LA Vol (A2C):   31.5 ml 15.28 ml/m  RA Volume:   11.50 ml 5.58 ml/m LA Vol (A4C):   27.9 ml 13.54 ml/m LA Biplane Vol: 32.5 ml 15.77 ml/m  AORTIC VALVE             PULMONIC VALVE  LVOT Vmax:   66.30 cm/s  PV Vmax:       0.71 m/s LVOT Vmean:  41.000 cm/s PV Peak grad:  2.0 mmHg LVOT VTI:    0.171 m  AORTA Ao Root diam: 3.70 cm Ao Asc diam:  4.10 cm MITRAL VALVE MV Area (PHT): 2.51 cm    SHUNTS MV Decel Time: 302 msec    Systemic VTI:  0.17 m MV E velocity: 47.20 cm/s  Systemic Diam: 2.00 cm MV A velocity: 60.30 cm/s MV E/A ratio:  0.78 Vina Gull MD Electronically signed by Vina Gull MD Signature Date/Time: 10/08/2023/6:00:57 PM    Final     Vitals:   10/08/23 2127 10/08/23 2348 10/09/23 0432 10/09/23 0834  BP: 113/73 94/66 114/65 125/75  Pulse: (!) 58 60 (!) 51 (!) 51  Resp: 17 17 18    Temp: 98.8 F (37.1 C) 98.8 F (37.1 C) 97.7 F (36.5 C) 98 F (36.7 C)  TempSrc: Oral Oral Oral Oral  SpO2: 95% 97% 98% 96%  Weight:      Height:         PHYSICAL EXAM General: Chronically ill male Psych:  Mood and affect appropriate for situation CV: Regular rate and rhythm on monitor Respiratory:  Regular, unlabored respirations on room air GI: Abdomen soft and nontender   NEURO:  Mental Status: Awake alert and oriented, able to answer orientation questions.  Can follow simple commands Speech/Language: speech is without dysarthria or aphasia.  Naming, repetition, fluency, and comprehension intact.  Cranial Nerves:  II: PERRL. Visual fields full.  III, IV, VI: EOMI. Eyelids elevate symmetrically.  V: Sensation is intact to light touch and symmetrical to face.  VII: Face is symmetrical resting and smiling VIII: hearing intact  to voice. IX, X: Palate elevates symmetrically. Phonation is normal.  KP:Dynloizm shrug 5/5. XII: tongue is midline without fasciculations. Motor: 5/5 strength in bilateral uppers, bilateral lowers 4 -/5 Tone: is normal and bulk is normal Sensation- Intact to light touch bilaterally. Extinction absent to light touch to DSS.   Coordination: FTN intact bilaterally, Gait- deferred   ASSESSMENT/PLAN  Troy Randall is a 63 y.o. male with  history of  multiple prior CVAs, most recent (04/2021), anemia, arthritis, diabetes, hyperlipidemia, hypertension, kidney stones, cholecystitis, UTI, suprapubic foley, GI bleed,  who presents to ED after results of routine MRI showed  Four small acute infarcts within the right corona radiata, right caudate nucleus and left periatrial white matter  Acute Ischemic Infarct: Right corona radiata and caudate nucleus and left periatrial white matter Etiology: Small vessel disease CTA head & neck no LVO MRI  small acute infarcts in right corona radiata, right caudate nucleus and left periatrial white matter. 2D Echo EF 60 to 65%. LDL 56 HgbA1c 5.3 VTE prophylaxis -SCDs No antithrombotic prior to admission, now on aspirin  81 mg daily due to history of ulcer bleeding and bleeding due to diverticulitis Therapy recommendations:  Pending Disposition: Pending  Hx of Stroke/TIA April 2023 left frontal lobe stroke, with additional lacunar infarcts  Hypertension Home meds: Metoprolol  12.5 mg, UnStable Blood Pressure Goal: SBP less than 160   Hyperlipidemia Home meds: Crestor  40 mg, resumed in hospital LDL 56, goal < 70 Continue statin at discharge  Diabetes type II Controlled Home meds: Tresiba , insulin  HgbA1c 5.3, goal < 7.0 CBGs SSI Recommend close follow-up with PCP for better DM control  Dysphagia Patient has post-stroke dysphagia, SLP consulted    Diet   Diet regular Room service appropriate? Yes; Fluid consistency: Thin   Advance diet as tolerated   Hospital day # 1   Karna Geralds DNP, ACNPC-AG  [facility]   I have personally obtained history,examined this patient, reviewed notes, independently viewed imaging studies, participated in medical decision making and plan of care.ROS completed by me personally and pertinent positives fully documented  I have made any additions or clarifications directly to the above note. Agree with note above.    Eather Popp,  MD Medical Director [facility] Pager: [phone] 10/12/2023 5:31 PM  To contact Stroke Continuity provider, please refer to [email or web address]. After hours, contact General Neurology

## 2023-10-09 NOTE — TOC Transition Note (Signed)
 Transition of Care [facility]) - Discharge Note   Patient Details  Name: Troy Randall MRN: [id number] Date of Birth: April 17, 1960  Transition of Care [facility]) CM/SW Contact:  [name], RN Phone Number: 10/09/2023, 1:26 PM   Clinical Narrative:    Troy Randall is stable to discharge home. Follow up apt on AVS. No ICM (Inpatient Care Management) needs at this time.    Final next level of care: Home/Self Care Barriers to Discharge: Barriers Resolved   Patient Goals and CMS Choice Patient states their goals for this hospitalization and ongoing recovery are:: return home          Discharge Placement  Home                     Discharge Plan and Services Additional resources added to the After Visit Summary for                                       Social Drivers of Health (SDOH) Interventions SDOH Screenings   Food Insecurity: No Food Insecurity (10/08/2023)  Housing: Low Risk  (10/08/2023)  Transportation Needs: No Transportation Needs (10/08/2023)  Utilities: Not At Risk (10/08/2023)  Depression (PHQ2-9): High Risk (01/08/2022)  Social Connections: Moderately Integrated (01/22/2023)  Tobacco Use: Medium Risk (10/08/2023)     Readmission Risk Interventions    01/20/2023    4:13 PM 04/03/2022   12:24 PM  Readmission Risk Prevention Plan  Post Dischage Appt Complete   Medication Screening Complete   Transportation Screening Complete Complete  PCP or Specialist Appt within 5-7 Days  Complete  Medication Review (RN CM)  Complete

## 2023-10-09 NOTE — Progress Notes (Signed)
 OT Cancellation Note  Patient Details Name: Troy Randall MRN: [id number] DOB: September 24, 1960   Cancelled Treatment:    Reason Eval/Treat Not Completed: Other (comment) (OT orders d/c'd 10/4. Please reconsult as appropriate.)  Troy Randall, OTD, OTR/L SecureChat [facility] [phone]   Troy Randall 10/09/2023, 9:00 AM

## 2023-10-09 NOTE — Care Management Obs Status (Signed)
 MEDICARE OBSERVATION STATUS NOTIFICATION   Patient Details  Name: Troy Randall MRN: [id number] Date of Birth: 1960-01-14   Medicare Observation Status Notification Given:  Yes    [name], RN 10/09/2023, 1:25 PM

## 2023-10-09 NOTE — Care Management CC44 (Signed)
 Condition Code 44 Documentation Completed  Patient Details  Name: Troy Randall MRN: [id number] Date of Birth: 10-Oct-1960   Condition Code 44 given:  Yes Patient signature on Condition Code 44 notice:  Yes Documentation of 2 MD's agreement:  Yes Code 44 added to claim:  Yes    [name], RN 10/09/2023, 1:25 PM

## 2023-10-18 DIAGNOSIS — Z09 Encounter for follow-up examination after completed treatment for conditions other than malignant neoplasm: Secondary | ICD-10-CM | POA: Diagnosis not present

## 2023-10-18 DIAGNOSIS — I10 Essential (primary) hypertension: Secondary | ICD-10-CM | POA: Diagnosis not present

## 2023-10-18 DIAGNOSIS — Z9359 Other cystostomy status: Secondary | ICD-10-CM | POA: Diagnosis not present

## 2023-10-18 DIAGNOSIS — J302 Other seasonal allergic rhinitis: Secondary | ICD-10-CM | POA: Diagnosis not present

## 2023-10-18 DIAGNOSIS — G4700 Insomnia, unspecified: Secondary | ICD-10-CM | POA: Diagnosis not present

## 2023-10-18 DIAGNOSIS — E782 Mixed hyperlipidemia: Secondary | ICD-10-CM | POA: Diagnosis not present

## 2023-10-18 DIAGNOSIS — Z789 Other specified health status: Secondary | ICD-10-CM | POA: Diagnosis not present

## 2023-10-18 DIAGNOSIS — E1165 Type 2 diabetes mellitus with hyperglycemia: Secondary | ICD-10-CM | POA: Diagnosis not present

## 2023-10-18 DIAGNOSIS — N401 Enlarged prostate with lower urinary tract symptoms: Secondary | ICD-10-CM | POA: Diagnosis not present

## 2023-10-18 DIAGNOSIS — Z7409 Other reduced mobility: Secondary | ICD-10-CM | POA: Diagnosis not present

## 2023-10-18 DIAGNOSIS — K5909 Other constipation: Secondary | ICD-10-CM | POA: Diagnosis not present

## 2023-10-18 DIAGNOSIS — F331 Major depressive disorder, recurrent, moderate: Secondary | ICD-10-CM | POA: Diagnosis not present

## 2023-10-25 NOTE — Progress Notes (Signed)
 Prepubic tube change impression/Assessment:  -Incomplete bladder emptying, indwelling catheter present.  He does have history of catastrophic neurologic event.  He is still not very functional.  -History of ureteral stone, status post ureteroscopic management of left distal stone and March.    -Postplacement of SP tube status   Plan:  -I wrote an order for him to have his catheter changed with the 20 [place] Foley/10 cc balloon every 4 weeks  - Office visit here every 6 months  History of Present Illness: 63 year old male here for follow-up.  Initially seen by me in December 2023.  The patient had acute urinary retention.  He was admitted to the hospital prior to that appointment with a GI bleed, encephalitis and examined his cholecystitis.  When I saw him, he was still significantly debilitated.  It was felt best to leave him with a catheter at least in the short-term  He underwent ureteroscopic management of a left UVJ stone by Dr. Medford Han on 03/30/2022.  His stent was subsequently removed by me at his last visit here in April.  8.6.2024: For recheck.  Has his catheter changed every month.  His biliary tube is changed every couple of months.  He has not been treated for recent urinary tract infection.  He does have his urine sent off once in a while for culture at the behest of his home health care nurse.  No improvement in neurologic situation.  7.18.2025: IR visit for sp tube placement. 16 fr councill cath placed.  He has been treated for 3 UTIs recently.  It does not sound like he has become febrile, perhaps he has had mild mental status change.  10.21.2025: Here today for SP tube change.  He has a 20 [place] Foley in place.  No problems since his last visit. Past Medical History:  Diagnosis Date   Anemia    Arthritis    CVA (cerebral vascular accident)    last CVA 04/2021   Diabetes mellitus without complication    type 2   Hyperlipidemia    Hypertension    Kidney stones      Past Surgical History:  Procedure Laterality Date   ANKLE SURGERY     BIOPSY  09/10/2021   Procedure: BIOPSY;  Surgeon: Rollin Dover, MD;  Location: [facility] ENDOSCOPY;  Service: Gastroenterology;;   BIOPSY  10/07/2021   Procedure: BIOPSY;  Surgeon: Rollin Dover, MD;  Location: [facility] ENDOSCOPY;  Service: Gastroenterology;;   COLONOSCOPY N/A 10/07/2021   Procedure: COLONOSCOPY;  Surgeon: Rollin Dover, MD;  Location: [facility] ENDOSCOPY;  Service: Gastroenterology;  Laterality: N/A;   COLONOSCOPY WITH PROPOFOL  N/A 09/06/2021   Procedure: COLONOSCOPY WITH PROPOFOL ;  Surgeon: Eartha Angelia Sieving, MD;  Location: [facility] ENDO SUITE;  Service: Gastroenterology;  Laterality: N/A;   CYSTOSCOPY/URETEROSCOPY/HOLMIUM LASER/STENT PLACEMENT Left 03/30/2022   Procedure: CYSTOSCOPY/RETROGRADE/URETEROSCOPY/HOLMIUM LASER/STENT PLACEMENT;  Surgeon: Han Lonni Righter, MD;  Location: [facility] ORS;  Service: Urology;  Laterality: Left;   ENTEROSCOPY N/A 09/10/2021   Procedure: ENTEROSCOPY;  Surgeon: Rollin Dover, MD;  Location: [facility] ENDOSCOPY;  Service: Gastroenterology;  Laterality: N/A;   ENTEROSCOPY  09/06/2021   Procedure: ENTEROSCOPY;  Surgeon: Eartha Angelia Sieving, MD;  Location: [facility] ENDO SUITE;  Service: Gastroenterology;;   ESOPHAGOGASTRODUODENOSCOPY (EGD) WITH PROPOFOL   09/06/2021   Procedure: ESOPHAGOGASTRODUODENOSCOPY (EGD) WITH PROPOFOL ;  Surgeon: Eartha Angelia Sieving, MD;  Location: [facility] ENDO SUITE;  Service: Gastroenterology;;   ENID SIGMOIDOSCOPY N/A 09/19/2021   Procedure: ENID MORIN;  Surgeon: Rollin Dover, MD;  Location: [facility] ENDOSCOPY;  Service:  Gastroenterology;  Laterality: N/A;   FOREIGN BODY REMOVAL  09/19/2021   Procedure: FOREIGN BODY REMOVAL;  Surgeon: Rollin Dover, MD;  Location: [facility] ENDOSCOPY;  Service: Gastroenterology;;   KRISTENE CAPSULE STUDY  09/06/2021   Procedure: GIVENS CAPSULE STUDY;  Surgeon: Eartha Angelia Sieving, MD;  Location: [facility] ENDO SUITE;  Service: Gastroenterology;;    HERNIA REPAIR     umbilical x1 Incisional x1   INCISIONAL HERNIA REPAIR  04/13/2011   Procedure: LAPAROSCOPIC INCISIONAL HERNIA;  Surgeon: Oneil DELENA Budge, MD;  Location: [facility] ORS;  Service: General;  Laterality: N/A;  Recurrent Laparoscopic Incisional Herniorraphy with Mesh   IR ANGIOGRAM SELECTIVE EACH ADDITIONAL VESSEL  09/09/2021   IR ANGIOGRAM VISCERAL SELECTIVE  09/07/2021   IR ANGIOGRAM VISCERAL SELECTIVE  09/06/2021   IR ANGIOGRAM VISCERAL SELECTIVE  09/06/2021   IR CHOLANGIOGRAM EXISTING TUBE  11/06/2021   IR EMBO ART  VEN HEMORR LYMPH EXTRAV  INC GUIDE ROADMAPPING  09/06/2021   IR EXCHANGE BILIARY DRAIN  10/24/2021   IR EXCHANGE BILIARY DRAIN  11/19/2021   IR EXCHANGE BILIARY DRAIN  01/19/2022   IR EXCHANGE BILIARY DRAIN  02/19/2022   IR EXCHANGE BILIARY DRAIN  04/01/2022   IR GUIDED DRAIN W CATHETER PLACEMENT  09/07/2021   IR US  GUIDE BX ASP/DRAIN  09/07/2021   IR US  GUIDE VASC ACCESS RIGHT  09/07/2021   IR US  GUIDE VASC ACCESS RIGHT  09/06/2021   KIDNEY STONE SURGERY     RADIOLOGY WITH ANESTHESIA N/A 03/10/2022   Procedure: MRI WITH ANESTHESIA OF BRAIN WITH AND WITHOUT CONTRAST;  Surgeon: Radiologist, Medication, MD;  Location: [facility] OR;  Service: Radiology;  Laterality: N/A;    Home Medications:  Allergies as of 04/21/2022       Reactions   Bactrim  [sulfamethoxazole -trimethoprim ] Rash   Amoxicillin  Hives   Tolerated Cefepime         Medication List        Accurate as of April 21, 2022  8:59 AM. If you have any questions, ask your nurse or doctor.          BD Veo Insulin  Syringe U/F 31G X 15/64 1 ML Misc Generic drug: Insulin  Syringe-Needle U-100 USE AS DIRECTED   glucose blood test strip Commonly known as: OneTouch Verio TEST twice a day   Insulin  Pen Needle 32G X 5 MM Misc Commonly known as: NovoTwist Use two daily to inject Victoza  and Toujeo .   methocarbamol  500 MG tablet Commonly known as: ROBAXIN  Take 1 tablet (500 mg total) by mouth every 6 (six) hours as needed for  muscle spasms.   metoprolol  succinate 25 MG 24 hr tablet Commonly known as: TOPROL -XL Take 0.5 tablets (12.5 mg total) by mouth daily.   OneTouch Delica Lancets 33G Misc Use to check blood sugar once a day dx code E11.65   pantoprazole  40 MG tablet Commonly known as: PROTONIX  Take 1 tablet (40 mg total) by mouth daily. What changed: when to take this   potassium chloride  SA 20 MEQ tablet Commonly known as: KLOR-CON  M Take 2 tablets (40 mEq total) by mouth 2 (two) times daily.   PROBIOTIC PO Take 8.5 mg by mouth daily.   rosuvastatin  40 MG tablet Commonly known as: Crestor  Take 1 tablet (40 mg total) by mouth daily.   traZODone  50 MG tablet Commonly known as: DESYREL  Take 0.5-1 tablets (25-50 mg total) by mouth at bedtime as needed for sleep. What changed:  how much to take when to take this   Tresiba  FlexTouch 200  UNIT/ML FlexTouch Pen Generic drug: insulin  degludec Inject under skin 15 units twice a day. What changed:  how much to take how to take this when to take this additional instructions   TYLENOL  500 MG tablet Generic drug: acetaminophen  Take 1 tablet (500 mg total) by mouth every 6 (six) hours as needed for mild pain or headache.        Allergies:  Allergies  Allergen Reactions   Bactrim  [Sulfamethoxazole -Trimethoprim ] Rash   Amoxicillin  Hives    Tolerated Cefepime     Family History  Problem Relation Age of Onset   Heart failure Mother    Diabetes Father    Cancer Other    Heart attack Other    Anesthesia problems Neg Hx    Hypotension Neg Hx    Malignant hyperthermia Neg Hx    Pseudochol deficiency Neg Hx    Colon cancer Neg Hx    Inflammatory bowel disease Neg Hx     Social History:  reports that he has never smoked. He has quit using smokeless tobacco.  His smokeless tobacco use included snuff. He reports that he does not drink alcohol and does not use drugs.  ROS: A complete review of systems was performed.  All systems are  negative except for pertinent findings as noted.  Physical Exam:  Vital signs in last 24 hours: There were no vitals taken for this visit. Procedure note: Old suprapubic tube was easily removed.  SP site was then prepped, and a 20French Foley catheter was placed quite easily through the suprapubic tract.  Balloon filled with 10 cc of water , hooked to dependent drainage.  I have reviewed prior pt notes  I have reviewed notes from IR visit  I have reviewed prior urine culture

## 2023-10-26 ENCOUNTER — Ambulatory Visit: Admitting: Urology

## 2023-10-26 VITALS — BP 128/80 | HR 75

## 2023-10-26 DIAGNOSIS — N401 Enlarged prostate with lower urinary tract symptoms: Secondary | ICD-10-CM

## 2023-10-26 DIAGNOSIS — Z960 Presence of urogenital implants: Secondary | ICD-10-CM | POA: Diagnosis not present

## 2023-10-26 DIAGNOSIS — R339 Retention of urine, unspecified: Secondary | ICD-10-CM

## 2023-10-26 DIAGNOSIS — Z87442 Personal history of urinary calculi: Secondary | ICD-10-CM | POA: Diagnosis not present

## 2023-10-26 DIAGNOSIS — Z435 Encounter for attention to cystostomy: Secondary | ICD-10-CM

## 2023-10-29 DIAGNOSIS — I471 Supraventricular tachycardia, unspecified: Secondary | ICD-10-CM | POA: Diagnosis not present

## 2023-10-29 DIAGNOSIS — K529 Noninfective gastroenteritis and colitis, unspecified: Secondary | ICD-10-CM | POA: Diagnosis not present

## 2023-10-29 DIAGNOSIS — G4700 Insomnia, unspecified: Secondary | ICD-10-CM | POA: Diagnosis not present

## 2023-10-29 DIAGNOSIS — I10 Essential (primary) hypertension: Secondary | ICD-10-CM | POA: Diagnosis not present

## 2023-10-29 DIAGNOSIS — Z0000 Encounter for general adult medical examination without abnormal findings: Secondary | ICD-10-CM | POA: Diagnosis not present

## 2023-10-29 DIAGNOSIS — K922 Gastrointestinal hemorrhage, unspecified: Secondary | ICD-10-CM | POA: Diagnosis not present

## 2023-10-29 DIAGNOSIS — Z8673 Personal history of transient ischemic attack (TIA), and cerebral infarction without residual deficits: Secondary | ICD-10-CM | POA: Diagnosis not present

## 2023-10-29 DIAGNOSIS — D649 Anemia, unspecified: Secondary | ICD-10-CM | POA: Diagnosis not present

## 2023-10-29 DIAGNOSIS — N200 Calculus of kidney: Secondary | ICD-10-CM | POA: Diagnosis not present

## 2023-10-29 DIAGNOSIS — E785 Hyperlipidemia, unspecified: Secondary | ICD-10-CM | POA: Diagnosis not present

## 2023-10-29 DIAGNOSIS — E1169 Type 2 diabetes mellitus with other specified complication: Secondary | ICD-10-CM | POA: Diagnosis not present

## 2023-10-29 DIAGNOSIS — E876 Hypokalemia: Secondary | ICD-10-CM | POA: Diagnosis not present

## 2023-11-05 DIAGNOSIS — F331 Major depressive disorder, recurrent, moderate: Secondary | ICD-10-CM | POA: Diagnosis not present

## 2023-11-05 DIAGNOSIS — E782 Mixed hyperlipidemia: Secondary | ICD-10-CM | POA: Diagnosis not present

## 2023-11-05 DIAGNOSIS — E1169 Type 2 diabetes mellitus with other specified complication: Secondary | ICD-10-CM | POA: Diagnosis not present

## 2023-11-05 DIAGNOSIS — I10 Essential (primary) hypertension: Secondary | ICD-10-CM | POA: Diagnosis not present

## 2023-11-09 DIAGNOSIS — F331 Major depressive disorder, recurrent, moderate: Secondary | ICD-10-CM | POA: Diagnosis not present

## 2023-11-09 DIAGNOSIS — J302 Other seasonal allergic rhinitis: Secondary | ICD-10-CM | POA: Diagnosis not present

## 2023-11-09 DIAGNOSIS — I10 Essential (primary) hypertension: Secondary | ICD-10-CM | POA: Diagnosis not present

## 2023-11-09 DIAGNOSIS — G4700 Insomnia, unspecified: Secondary | ICD-10-CM | POA: Diagnosis not present

## 2023-11-09 DIAGNOSIS — Z9359 Other cystostomy status: Secondary | ICD-10-CM | POA: Diagnosis not present

## 2023-11-09 DIAGNOSIS — K5909 Other constipation: Secondary | ICD-10-CM | POA: Diagnosis not present

## 2023-11-09 DIAGNOSIS — Z789 Other specified health status: Secondary | ICD-10-CM | POA: Diagnosis not present

## 2023-11-09 DIAGNOSIS — Z7409 Other reduced mobility: Secondary | ICD-10-CM | POA: Diagnosis not present

## 2023-11-09 DIAGNOSIS — E782 Mixed hyperlipidemia: Secondary | ICD-10-CM | POA: Diagnosis not present

## 2023-11-09 DIAGNOSIS — E1165 Type 2 diabetes mellitus with hyperglycemia: Secondary | ICD-10-CM | POA: Diagnosis not present

## 2023-11-09 DIAGNOSIS — K922 Gastrointestinal hemorrhage, unspecified: Secondary | ICD-10-CM | POA: Diagnosis not present

## 2023-11-09 DIAGNOSIS — N401 Enlarged prostate with lower urinary tract symptoms: Secondary | ICD-10-CM | POA: Diagnosis not present

## 2023-12-05 DIAGNOSIS — E782 Mixed hyperlipidemia: Secondary | ICD-10-CM | POA: Diagnosis not present

## 2023-12-05 DIAGNOSIS — I10 Essential (primary) hypertension: Secondary | ICD-10-CM | POA: Diagnosis not present

## 2023-12-05 DIAGNOSIS — F331 Major depressive disorder, recurrent, moderate: Secondary | ICD-10-CM | POA: Diagnosis not present

## 2023-12-05 DIAGNOSIS — E1165 Type 2 diabetes mellitus with hyperglycemia: Secondary | ICD-10-CM | POA: Diagnosis not present

## 2023-12-07 DIAGNOSIS — Z9359 Other cystostomy status: Secondary | ICD-10-CM | POA: Diagnosis not present

## 2023-12-07 DIAGNOSIS — I10 Essential (primary) hypertension: Secondary | ICD-10-CM | POA: Diagnosis not present

## 2023-12-07 DIAGNOSIS — K922 Gastrointestinal hemorrhage, unspecified: Secondary | ICD-10-CM | POA: Diagnosis not present

## 2023-12-07 DIAGNOSIS — G4700 Insomnia, unspecified: Secondary | ICD-10-CM | POA: Diagnosis not present

## 2023-12-07 DIAGNOSIS — F331 Major depressive disorder, recurrent, moderate: Secondary | ICD-10-CM | POA: Diagnosis not present

## 2023-12-07 DIAGNOSIS — K5909 Other constipation: Secondary | ICD-10-CM | POA: Diagnosis not present

## 2023-12-07 DIAGNOSIS — N401 Enlarged prostate with lower urinary tract symptoms: Secondary | ICD-10-CM | POA: Diagnosis not present

## 2023-12-07 DIAGNOSIS — Z7409 Other reduced mobility: Secondary | ICD-10-CM | POA: Diagnosis not present

## 2023-12-07 DIAGNOSIS — E782 Mixed hyperlipidemia: Secondary | ICD-10-CM | POA: Diagnosis not present

## 2023-12-07 DIAGNOSIS — Z789 Other specified health status: Secondary | ICD-10-CM | POA: Diagnosis not present

## 2023-12-07 DIAGNOSIS — J302 Other seasonal allergic rhinitis: Secondary | ICD-10-CM | POA: Diagnosis not present

## 2023-12-07 DIAGNOSIS — E1165 Type 2 diabetes mellitus with hyperglycemia: Secondary | ICD-10-CM | POA: Diagnosis not present

## 2024-04-24 ENCOUNTER — Ambulatory Visit: Admitting: Urology
